# Patient Record
Sex: Female | Born: 1935 | Race: White | Hispanic: No | Smoking: Former smoker
Health system: Southern US, Community
[De-identification: ages and names within clinical notes are randomized; demographics above are authoritative.]

## PROBLEM LIST (undated history)

## (undated) DIAGNOSIS — J45909 Unspecified asthma, uncomplicated: Secondary | ICD-10-CM

## (undated) DIAGNOSIS — K635 Polyp of colon: Secondary | ICD-10-CM

## (undated) DIAGNOSIS — R011 Cardiac murmur, unspecified: Secondary | ICD-10-CM

## (undated) DIAGNOSIS — M1990 Unspecified osteoarthritis, unspecified site: Secondary | ICD-10-CM

## (undated) DIAGNOSIS — E860 Dehydration: Secondary | ICD-10-CM

## (undated) DIAGNOSIS — I38 Endocarditis, valve unspecified: Secondary | ICD-10-CM

## (undated) DIAGNOSIS — J209 Acute bronchitis, unspecified: Secondary | ICD-10-CM

## (undated) DIAGNOSIS — J189 Pneumonia, unspecified organism: Secondary | ICD-10-CM

## (undated) DIAGNOSIS — F329 Major depressive disorder, single episode, unspecified: Secondary | ICD-10-CM

## (undated) DIAGNOSIS — I209 Angina pectoris, unspecified: Secondary | ICD-10-CM

## (undated) DIAGNOSIS — B019 Varicella without complication: Secondary | ICD-10-CM

## (undated) DIAGNOSIS — E039 Hypothyroidism, unspecified: Secondary | ICD-10-CM

## (undated) DIAGNOSIS — N189 Chronic kidney disease, unspecified: Secondary | ICD-10-CM

## (undated) DIAGNOSIS — F0390 Unspecified dementia without behavioral disturbance: Secondary | ICD-10-CM

## (undated) DIAGNOSIS — I10 Essential (primary) hypertension: Secondary | ICD-10-CM

## (undated) DIAGNOSIS — R0602 Shortness of breath: Secondary | ICD-10-CM

## (undated) DIAGNOSIS — N390 Urinary tract infection, site not specified: Secondary | ICD-10-CM

## (undated) DIAGNOSIS — D649 Anemia, unspecified: Secondary | ICD-10-CM

## (undated) DIAGNOSIS — F32A Depression, unspecified: Secondary | ICD-10-CM

## (undated) DIAGNOSIS — J069 Acute upper respiratory infection, unspecified: Secondary | ICD-10-CM

## (undated) DIAGNOSIS — M79605 Pain in left leg: Secondary | ICD-10-CM

## (undated) DIAGNOSIS — R51 Headache: Secondary | ICD-10-CM

## (undated) DIAGNOSIS — F419 Anxiety disorder, unspecified: Secondary | ICD-10-CM

## (undated) DIAGNOSIS — I499 Cardiac arrhythmia, unspecified: Secondary | ICD-10-CM

## (undated) HISTORY — PX: BACK SURGERY: SHX140

## (undated) HISTORY — PX: ABDOMINAL HYSTERECTOMY: SHX81

## (undated) HISTORY — PX: HAND SURGERY: SHX662

## (undated) HISTORY — PX: COLONOSCOPY W/ POLYPECTOMY: SHX1380

## (undated) HISTORY — DX: Dehydration: E86.0

## (undated) HISTORY — DX: Varicella without complication: B01.9

## (undated) HISTORY — DX: Pain in left leg: M79.605

## (undated) HISTORY — DX: Acute bronchitis, unspecified: J20.9

## (undated) HISTORY — DX: Endocarditis, valve unspecified: I38

## (undated) HISTORY — PX: APPENDECTOMY: SHX54

## (undated) HISTORY — DX: Urinary tract infection, site not specified: N39.0

## (undated) HISTORY — PX: CHOLECYSTECTOMY: SHX55

## (undated) HISTORY — PX: TONSILLECTOMY: SHX5217

## (undated) HISTORY — PX: FRACTURE SURGERY: SHX138

## (undated) HISTORY — DX: Chronic kidney disease, unspecified: N18.9

## (undated) HISTORY — PX: SHOULDER ARTHROSCOPY: SHX128

## (undated) HISTORY — PX: OTHER SURGICAL HISTORY: SHX169

## (undated) HISTORY — PX: EYE SURGERY: SHX253

## (undated) HISTORY — PX: TUBAL LIGATION: SHX77

## (undated) HISTORY — DX: Polyp of colon: K63.5

## (undated) HISTORY — DX: Unspecified osteoarthritis, unspecified site: M19.90

## (undated) HISTORY — PX: TONSILLECTOMY: SUR1361

---

## 1999-08-01 ENCOUNTER — Ambulatory Visit (HOSPITAL_COMMUNITY): Admission: RE | Admit: 1999-08-01 | Discharge: 1999-08-01 | Payer: Self-pay | Admitting: Internal Medicine

## 1999-08-01 ENCOUNTER — Encounter: Payer: Self-pay | Admitting: Internal Medicine

## 2004-04-03 ENCOUNTER — Observation Stay (HOSPITAL_COMMUNITY): Admission: RE | Admit: 2004-04-03 | Discharge: 2004-04-04 | Payer: Self-pay | Admitting: Obstetrics and Gynecology

## 2004-04-03 ENCOUNTER — Encounter (INDEPENDENT_AMBULATORY_CARE_PROVIDER_SITE_OTHER): Payer: Self-pay | Admitting: *Deleted

## 2004-06-25 ENCOUNTER — Inpatient Hospital Stay (HOSPITAL_COMMUNITY): Admission: RE | Admit: 2004-06-25 | Discharge: 2004-06-28 | Payer: Self-pay | Admitting: *Deleted

## 2004-06-25 ENCOUNTER — Encounter (INDEPENDENT_AMBULATORY_CARE_PROVIDER_SITE_OTHER): Payer: Self-pay | Admitting: *Deleted

## 2004-11-22 ENCOUNTER — Emergency Department (HOSPITAL_COMMUNITY): Admission: EM | Admit: 2004-11-22 | Discharge: 2004-11-22 | Payer: Self-pay | Admitting: Emergency Medicine
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
## 2006-12-15 ENCOUNTER — Emergency Department (HOSPITAL_COMMUNITY): Admission: EM | Admit: 2006-12-15 | Discharge: 2006-12-16 | Payer: Self-pay | Admitting: Emergency Medicine

## 2007-01-25 ENCOUNTER — Ambulatory Visit (HOSPITAL_COMMUNITY): Admission: RE | Admit: 2007-01-25 | Discharge: 2007-01-26 | Payer: Self-pay | Admitting: Orthopaedic Surgery

## 2007-08-09 ENCOUNTER — Ambulatory Visit (HOSPITAL_COMMUNITY): Admission: RE | Admit: 2007-08-09 | Discharge: 2007-08-10 | Payer: Self-pay | Admitting: Orthopaedic Surgery

## 2007-11-02 ENCOUNTER — Emergency Department (HOSPITAL_COMMUNITY): Admission: EM | Admit: 2007-11-02 | Discharge: 2007-11-02 | Payer: Self-pay | Admitting: Emergency Medicine
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
## 2008-04-10 IMAGING — CR DG CHEST 2V
2 series · 2 of 2 positions shown · non-contrast
Comparison: none

CLINICAL DATA: Ulnar shaft fracture

CHEST - 2 VIEW:

[view not recorded (1 of 2)]
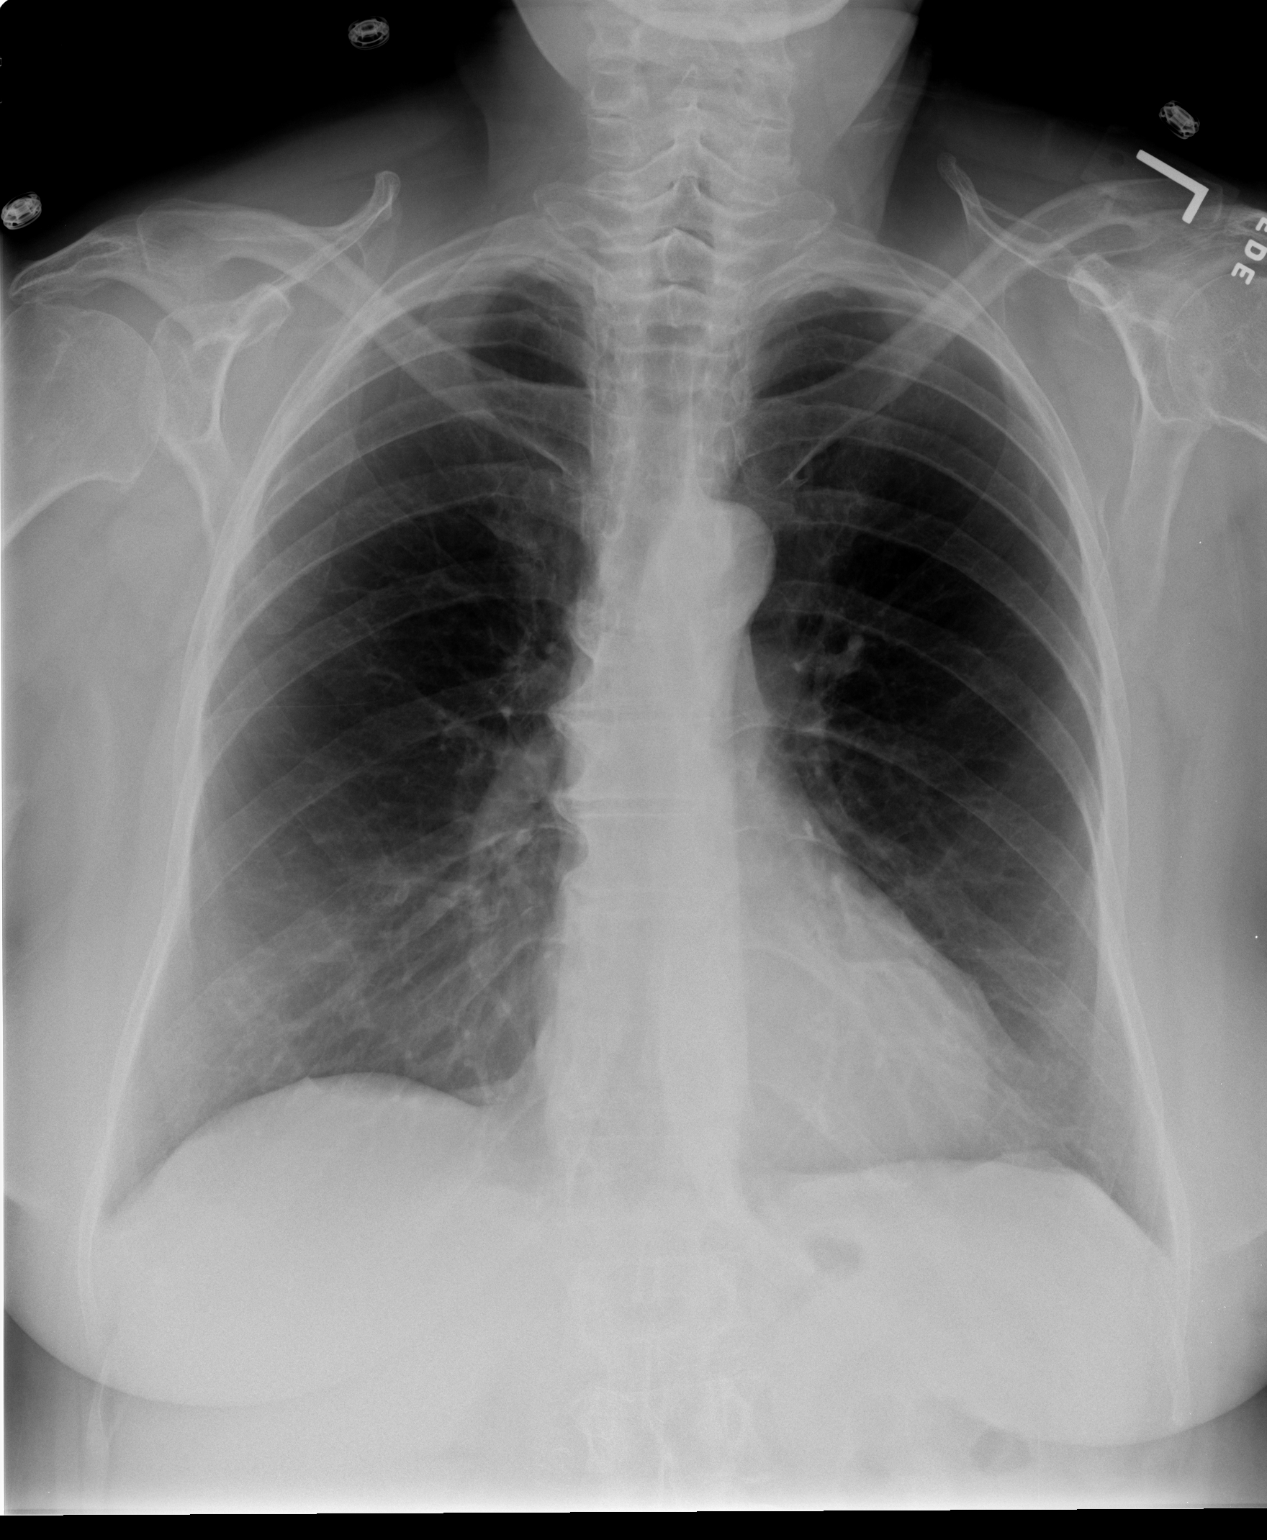

[view not recorded (2 of 2)]
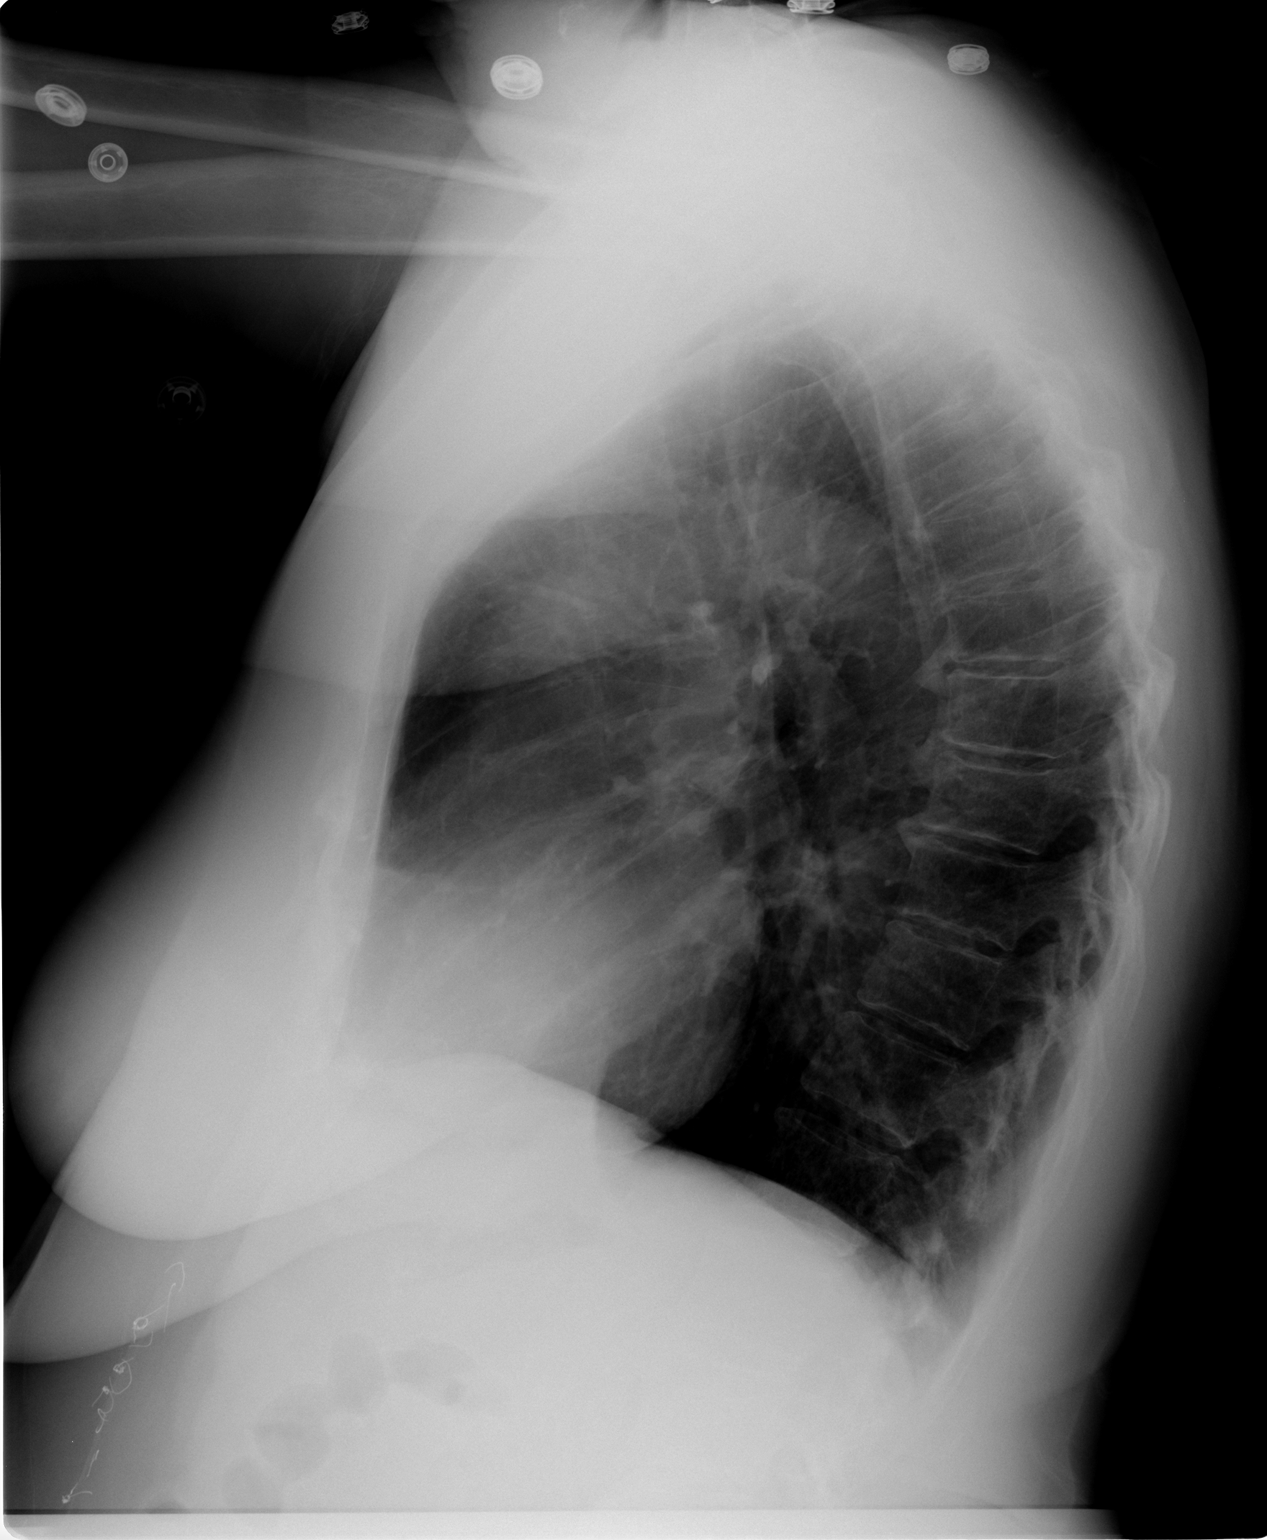

[2 of 2 positions shown; findings below may reference images not displayed]

FINDINGS: The heart size and mediastinal contours are within normal limits.

Both lungs are clear.  

Mild thoracic spondylosis.
IMPRESSION: No active cardiopulmonary disease.
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
## 2009-07-16 ENCOUNTER — Emergency Department (HOSPITAL_COMMUNITY): Admission: EM | Admit: 2009-07-16 | Discharge: 2009-07-16 | Payer: Self-pay | Admitting: Emergency Medicine

## 2009-07-21 ENCOUNTER — Emergency Department (HOSPITAL_COMMUNITY): Admission: EM | Admit: 2009-07-21 | Discharge: 2009-07-21 | Payer: Self-pay | Admitting: Emergency Medicine

## 2009-08-03 ENCOUNTER — Ambulatory Visit (HOSPITAL_BASED_OUTPATIENT_CLINIC_OR_DEPARTMENT_OTHER): Admission: RE | Admit: 2009-08-03 | Discharge: 2009-08-03 | Payer: Self-pay | Admitting: Urology

## 2009-10-23 ENCOUNTER — Encounter: Admission: RE | Admit: 2009-10-23 | Discharge: 2009-10-23 | Payer: Self-pay | Admitting: Gastroenterology

## 2010-04-29 ENCOUNTER — Emergency Department (HOSPITAL_COMMUNITY)
Admission: EM | Admit: 2010-04-29 | Discharge: 2010-04-29 | Payer: Self-pay | Source: Home / Self Care | Admitting: Emergency Medicine

## 2010-05-29 ENCOUNTER — Encounter
Admission: RE | Admit: 2010-05-29 | Discharge: 2010-05-29 | Payer: Self-pay | Source: Home / Self Care | Attending: Orthopedic Surgery | Admitting: Orthopedic Surgery

## 2010-06-19 ENCOUNTER — Inpatient Hospital Stay (HOSPITAL_COMMUNITY)
Admission: RE | Admit: 2010-06-19 | Discharge: 2010-06-24 | Payer: Self-pay | Source: Home / Self Care | Attending: Orthopedic Surgery | Admitting: Orthopedic Surgery

## 2010-06-22 NOTE — H&P (Addendum)
[name], [name]                 ACCOUNT NO.:  [id number]  MEDICAL RECORD NO.:  [id number]          PATIENT TYPE:  INP  LOCATION:  1612                         FACILITY:  [facility]  PHYSICIAN:  Georges Lynch. Lavelle Berland, M.D.DATE OF BIRTH:  03-29-1921  DATE OF ADMISSION:  06/19/2010 DATE OF DISCHARGE:                             HISTORY & PHYSICAL   CHIEF COMPLAINT:  Low back pain.  BRIEF HISTORY:  [name] came in with her daughter-in-law to go over myelogram.  She has been having severe pain in the left lower back that is radiating down the left lower leg causing weakness in her left foot. Myelogram revealed that she has severe spinal stenosis at L4-L5.  She also has postsurgical changes at L3-L4 and she has calcified disk along with the stenosis at this level.  She now presents for decompressive lumbar laminectomy at L3-L4, L4-L5 with microdiskectomy at L4-L5 on the left.  MEDICATION ALLERGIES:  CODEINE.  The patient does fine with Vicodin.  PRIMARY CARE PHYSICIAN:  Ace Gins, MD  CARDIOLOGIST:  Landry Corporal, MD  CURRENT MEDICATIONS: 1. Hydrochlorothiazide. 2. Lisinopril. 3. Levothyroxine. 4. Donepezil. 5. Hydrocodone/acetaminophen.  PAST MEDICAL HISTORY: 1. Low back pain with radiculopathy down the left leg. 2. Impaired memory. 3. Impaired hearing. 4. Dentures on top. 5. History of pneumonia. 6. Hypertension. 7. History of urinary tract infection, last one being at least a year     ago. 8. Thyroid disease. 9. Arthritis. 10.Degenerative disk disease. 11.History of measles, mumps, and rubella as a child. 12.History of menopause.  PAST SURGICAL HISTORY: 1. History of lumbar microdiskectomy x2. 2. History of lithotripsy for multiple kidney stones. 3. Bilateral carpal tunnel release. 4. Appendectomy. 5. Cholecystectomy. 6. Partial thyroidectomy. 7. Bilateral wrist fractures. 8. Cataract surgery. 9. Tonsillectomy. 10.Hysterectomy. 11.Bilateral foot  surgery. 12.Bilateral shoulder surgery.  FAMILY HISTORY:  Father had heart disease.  He passed of a stroke. Mother had osteoarthritis, passed of old age.  SOCIAL HISTORY:  She denies use of alcohol or tobacco products.  She is retired.  She lives alone and she would like to look into a skilled nursing facility for care after her hospital stay.  REVIEW OF SYSTEMS:  GENERAL:  Negative for fevers, chills, or weight change.  HEENT:  Noncontributory.  CARDIOVASCULAR:  History of hypertension.  RESPIRATORY:  Negative for shortness of breath.  GI: Negative for reflux disease or ulcers.  GU:  History of kidney stone and remote history of urinary tract infection.  SKIN:  Negative for rash or lesion.  ENDOCRINE:  Positive for thyroid disease.  NEUROLOGIC: Positive for weakness and paresthesias, see HPI.  HEMATOPOIETIC: Negative for bleeding disorder.  PHYSICAL EXAMINATION:  CONSTITUTIONAL/PSYCHIATRIC:  [name] is alert and oriented x3.  She is well developed, well nourished.  She is accompanied today by her daughter-in-law. HEENT:  Normocephalic, atraumatic.  Extraocular movements intact.  [name] is wearing glasses which she does at all times.  She also has dentures on top. NECK:  Supple, full range of motion without lymphadenopathy. CHEST:  Lungs are clear to auscultation bilaterally without wheezes. HEART:  Regular rate and rhythm without  murmur.  S1, S2 sounds are appreciated. ABDOMEN:  Bowel sounds present in all four quadrants.  Abdomen is soft, nontender, nondistended to palpation. MUSCULOSKELETAL:  Evaluation of the low back, no pain with palpation of lumbar spine.  She does have pain with hyperextension and left lateral flexion of the lumbar spine. NEUROLOGIC:  Decreased sensation in the left lower extremity.  She also has weakness on the dorsiflexor of the left foot and she has positive left straight leg raise. SKIN:  Unremarkable. LYMPHATIC:  Negative for peripheral  edema.  Again, myelogram revealed spinal stenosis at L3-L4, L4-L5 with disk herniation at L4-L5 on the left.  IMPRESSION:  Spinal stenosis with disk herniation at L4-L5 on the left.  PLAN:  [name] is scheduled for decompressive lumbar laminectomy to be performed by Dr. Darrelyn Hillock.  The patient will undergo all preoperative labs and testing at [facility].     Rozell Searing, [facility]   ______________________________ Georges Lynch. Darrelyn Hillock, M.D.    LD/MEDQ  D:  06/21/2010  T:  06/21/2010  Job:  [id number]  Electronically Signed by Rozell Searing  on 06/22/2010 07:32:54 AM Electronically Signed by Ranee Gosselin M.D. on 06/24/2010 11:51:44 AM

## 2010-06-24 LAB — TYPE AND SCREEN
Antibody Screen: POSITIVE
DAT, IgG: NEGATIVE
PT AG Type: NEGATIVE

## 2010-06-24 LAB — ABO/RH: ABO/RH(D): O NEG

## 2010-06-24 LAB — COMPREHENSIVE METABOLIC PANEL
ALT: 19 U/L (ref 0–35)
Alkaline Phosphatase: 80 U/L (ref 39–117)
BUN: 17 mg/dL (ref 6–23)
Creatinine, Ser: 1 mg/dL (ref 0.4–1.2)
Total Protein: 6.9 g/dL (ref 6.0–8.3)

## 2010-06-24 LAB — HEMOGLOBIN
Hemoglobin: 10.8 g/dL — ABNORMAL LOW (ref 12.0–15.0)
Hemoglobin: 9.7 g/dL — ABNORMAL LOW (ref 12.0–15.0)

## 2010-06-24 LAB — URINE MICROSCOPIC-ADD ON

## 2010-06-24 LAB — CBC
HCT: 36.8 % (ref 36.0–46.0)
MCHC: 34.2 g/dL (ref 30.0–36.0)
Platelets: 286 10*3/uL (ref 150–400)
RBC: 4.31 MIL/uL (ref 3.87–5.11)
RDW: 12.6 % (ref 11.5–15.5)

## 2010-06-24 LAB — PROTIME-INR: Prothrombin Time: 13.5 seconds (ref 11.6–15.2)

## 2010-06-24 LAB — URINALYSIS, ROUTINE W REFLEX MICROSCOPIC
Nitrite: NEGATIVE
Specific Gravity, Urine: 1.019 (ref 1.005–1.030)
Urine Glucose, Fasting: NEGATIVE mg/dL
pH: 5.5 (ref 5.0–8.0)

## 2010-06-24 LAB — SURGICAL PCR SCREEN: Staphylococcus aureus: INVALID — AB

## 2010-06-24 LAB — APTT: aPTT: 32 seconds (ref 24–37)

## 2010-06-24 LAB — DIFFERENTIAL
Eosinophils Absolute: 0.1 10*3/uL (ref 0.0–0.7)
Eosinophils Relative: 1 % (ref 0–5)
Monocytes Absolute: 0.4 10*3/uL (ref 0.1–1.0)
Neutro Abs: 4.8 10*3/uL (ref 1.7–7.7)

## 2010-06-24 LAB — HEMATOCRIT
HCT: 31.6 % — ABNORMAL LOW (ref 36.0–46.0)
HCT: 27.9 % — ABNORMAL LOW (ref 36.0–46.0)

## 2010-06-24 NOTE — Op Note (Signed)
  NAMEDAISEE, CENTNER                 ACCOUNT NO.:  [id number]  MEDICAL RECORD NO.:  [id number]          PATIENT TYPE:  INP  LOCATION:  0012                         FACILITY:  [facility]  PHYSICIAN:  Georges Lynch. Yan Okray, M.D.DATE OF BIRTH:  25-Mar-1921  DATE OF PROCEDURE:  06/19/2010 DATE OF DISCHARGE:                              OPERATIVE REPORT   CORRECTION:  In the dictation, I said Dura gel.  It was DuraSeal liquid that we first injected as a sealant and then we applied Surgicel and then dura foam.          ______________________________ Georges Lynch. Darrelyn Hillock, M.D.     RAG/MEDQ  D:  06/19/2010  T:  06/19/2010  Job:  [id number]  Electronically Signed by Ranee Gosselin M.D. on 06/24/2010 11:51:41 AM

## 2010-06-24 NOTE — Op Note (Signed)
NAMECARLEAH, YABLONSKI                 ACCOUNT NO.:  [id number]  MEDICAL RECORD NO.:  [id number]          PATIENT TYPE:  INP  LOCATION:  0012                         FACILITY:  [facility]  PHYSICIAN:  Georges Lynch. Makynleigh Breslin, M.D.DATE OF BIRTH:  [birth year]  DATE OF PROCEDURE:  06/19/2010 DATE OF DISCHARGE:                              OPERATIVE REPORT   An 77 year old.  PREOPERATIVE DIAGNOSES: 1. Severe spinal stenosis at L3-L4. 2. Severe spinal stenosis at L4-L5 and questionable disk herniation L4-     L5 versus facet arthritis. 3. She had a partial foot drop on the left. 4. She had 2 previous highlighted surgeries by another surgeon years     ago for spinal stenosis and I had no further details on those     operations because she does not know who did the surgery and where     it was done.  POSTOPERATIVE DIAGNOSES: 1. Severe spinal stenosis at L3-L4. 2. Severe spinal stenosis at L4-L5 with severe compromise of the     foramina on the left.  SURGEON:  Georges Lynch. Darrelyn Hillock, M.D.  ASSISTANT:  Marlowe Kays, M.D.  DESCRIPTION OF PROCEDURE:  Under general anesthesia, routine orthopedic prep and draping of the lower back carried out with the patient on spinal frame.  The appropriate time-out was carried out.  I did mark the back on the left side, so we knew exactly where the foot drop side was and where all her pain was.  After sterile prep and draping, two needles were placed in back for localization purpose.  Multiple x-rays were taken.  An incision was made over the L3-L4, L4-L5 interspaces and carried out distally.  At this time, we cauterized all the small bleeders that we had.  We stripped the muscle from the lamina and spinous processes bilaterally.  Two clamps were placed on the back for localization purposes again.  Following that, we then removed the spinous processes of L3 and L4 and part of L5.  We carried the dissection down entirely to the midline.  The microscope was brought  in. Note, the midline of her spine highlighted this, was like that she had a concrete.  There was no obvious openings, starting out.  It was not the typical interlaminar space.  She had 2 previous surgeries elsewhere. She does not recall the details of the surgery, but she presented with severe pain as I mentioned in the left leg and with a partial foot drop on the left.  So, we proceeded very carefully.  We went down to the normal.  We felt that normal area of bone was then worked our way up proximally and then we were able to enter the subligamentous space. Thus, the microscope was used.  We took a careful painstaking time to go through and identify the dura and all the scar tissue that was present. We found a large clump of steroid material that was injected in the past.  Once we proceeded, we were now able to expose the dura.  There was marked amount of scar tissue.  The dura was adhesed to the bone. We carefully went  up and down the canal at L3-L4, L4-L5 all way down distally and we opened up the foramina bilaterally.  At this time, we had a very small dural leak at the L4-L5 level.  When we attempted to remove the remaining piece of bone, it was literally adhered to the dura.  We felt at this time that it was necessary to remove that bone because of the compression and fortunately we had a very small leak. Following that, after we had irrigated the area out and after we had the dura free, we then utilized the appropriate procedure.  We utilized DuraGel liquid.  Following that, we used Surgicel and appropriate Duragen patch.  Other way before we close, we had excellent control of the spinal fluid leak.  There was no further leakage of fluid.  We stayed and observed the area closely.  We gave her another gram of IV Ancef.  We utilized a Durafoam patch and some Surgicel as well as DuraGel liquid.  We made sure that we had the canal opened, so there was no compression on the canal.  We  then closed the wound in layered usual fashion except I left a small distal deep and proximal deep portions of the wound opened for drainage purposes, so there was no hematoma formation on the dura.  Subcutaneous was closed with 0 Vicryl and skin with metal staples.  The patient left the operating room in satisfactory condition.  Note, I stayed in the recovery room with the patient, I liked this, and she was able to move both legs.  She had normal sensation in both legs and she no longer had a foot drop on the left that she had preop.  We planned that she is 89 to put her on the step- down bed, watch her closely, and keep her down at bedrest for at least 48 hours and we will utilize PAS hose.          ______________________________ Georges Lynch. Darrelyn Hillock, M.D.     RAG/MEDQ  D:  06/19/2010  T:  06/19/2010  Job:  [id number]  Electronically Signed by Ranee Gosselin M.D. on 06/24/2010 11:51:39 AM

## 2010-06-25 LAB — MRSA CULTURE

## 2010-07-15 NOTE — Discharge Summary (Addendum)
NAMECARLYON, [name]                 ACCOUNT NO.:  [id number]  MEDICAL RECORD NO.:  [id number]          PATIENT TYPE:  INP  LOCATION:  1612                         FACILITY:  [facility]  PHYSICIAN:  Georges Lynch. Gioffre, M.D.DATE OF BIRTH:  [birth year]  DATE OF ADMISSION:  06/19/2010 DATE OF DISCHARGE:  06/24/2010                              DISCHARGE SUMMARY   ADMITTING DIAGNOSES: 1. Low back pain with radiculopathy down the left leg. 2. Impaired memory. 3. Impaired hearing. 4. Dentures on top. 5. History of pneumonia. 6. Hypertension. 7. History of urinary tract infection. 8. Thyroid disease. 9. Arthritis. 10.Degenerative disk disease. 11.History of measles, mumps and rubella as a child. 12.History of menopause.  DISCHARGE DIAGNOSES:  Low back pain with radiculopathy down the left leg status post surgery for spinal stenosis at  L3-L4, L4-L5 and disk herniation at L4-L5.  LABORATORY DATA:  Preoperative CBC revealed a white count of 7.6, hemoglobin 12.6, hematocrit 36.8 and platelet count of 286. Preoperative INR is 1.01.  Preoperative chemistry panel is unremarkable. Preoperative urinalysis revealed slight urinary tract infection.  White blood cells 11 to 20, few bacteria and small leukocyte esterase. Preoperative PCR for MRSA is negative and PCR for staph aureus is positive.  PROCEDURE:  Surgery for spinal stenoses at L3-L4, L4-L5 and microdiskectomy at L4-L5.  HOSPITAL COURSE:  On June 19, 2010, Ms. Gatton was admitted to [facility].  She was taken to the operating room by surgeon Dr. Ranee Gosselin, assistant Dr. Marlowe Kays.  She underwent the above- stated procedure.  It was performed under general anesthesia.  Routine orthopedic prep and drape was carried out.  No complications during the procedure.  She was returned to the recovery room in satisfactory condition.  After adequate time in the recovery room, she was then taken to the floor for further  recovery.  She was on PCA reduced dose, Dilaudid and muscle relaxants for pain control.  Postoperative day #1, the patient seemed to be improving.  She had no leg pain.  Continued to do well. Postoperative day #2, she was resting comfortably, continuing to use the PCA.  Dressing was changed.  Wound  was well approximated.  No signs of infection.  Postoperative day #3, pain was well controlled.  She was visited by Physical Therapy.  On  June 23, 2010, she was able to ambulate 15 feet twice per the morning session and then in the afternoon session she was able to ambulate 30 feet, this was both using the rolling walker with minimal assistance in the afternoon session.  She was also able to work on stairs.  On postoperative day #5, the patient continued to progress and she was discharged home on postop day #5.  DISPOSITION:  To home on June 24, 2010.  DISCHARGE MEDICATIONS: 1. Donepezil. 2. Levothyroxine. 3. Lisinopril. 4. Hydrochlorothiazide. 5. Vicodin 10/650 mg. 6. Robaxin 500 mg on a  ACTIVITY:  The patient should increase her activity slowly.  She should use the walker.  She is able to shower.  She should be mindful to not submerge the wound in water.  DIET:  No restrictions.  WOUND CARE:  Daily dressing change.  FOLLOWUP:  Follow up with Dr. Darrelyn Hillock 1 week from Tuesday.  She should contact the office at [phone] to schedule the appointment.  CONDITION ON DISCHARGE:  Improving.     Rozell Searing, PAC   ______________________________ Georges Lynch Darrelyn Hillock, M.D.    LD/MEDQ  D:  07/09/2010  T:  07/09/2010  Job:  [id number]  Electronically Signed by Ranee Gosselin M.D. on 07/15/2010 09:03:01 AM Electronically Signed by Rozell Searing  on 07/15/2010 09:21:07 AM

## 2010-08-01 ENCOUNTER — Ambulatory Visit: Payer: Medicare Other | Admitting: Physical Therapy

## 2010-08-05 ENCOUNTER — Ambulatory Visit: Payer: Medicare Other | Attending: Orthopedic Surgery | Admitting: Rehabilitative and Restorative Service Providers"

## 2010-08-05 DIAGNOSIS — M545 Low back pain, unspecified: Secondary | ICD-10-CM | POA: Insufficient documentation

## 2010-08-05 DIAGNOSIS — M2569 Stiffness of other specified joint, not elsewhere classified: Secondary | ICD-10-CM | POA: Insufficient documentation

## 2010-08-05 DIAGNOSIS — IMO0001 Reserved for inherently not codable concepts without codable children: Secondary | ICD-10-CM | POA: Insufficient documentation

## 2010-08-05 DIAGNOSIS — M6281 Muscle weakness (generalized): Secondary | ICD-10-CM | POA: Insufficient documentation

## 2010-08-07 ENCOUNTER — Ambulatory Visit: Payer: Medicare Other | Admitting: Physical Therapy

## 2010-08-08 ENCOUNTER — Encounter: Payer: Medicare Other | Admitting: Rehabilitative and Restorative Service Providers"

## 2010-08-12 ENCOUNTER — Ambulatory Visit: Payer: Medicare Other | Admitting: Physical Therapy

## 2010-08-14 ENCOUNTER — Encounter: Payer: Medicare Other | Admitting: Physical Therapy

## 2010-08-19 ENCOUNTER — Encounter: Payer: Medicare Other | Admitting: Rehabilitative and Restorative Service Providers"

## 2010-08-21 ENCOUNTER — Encounter: Payer: Medicare Other | Admitting: Rehabilitative and Restorative Service Providers"

## 2010-08-21 LAB — URINE MICROSCOPIC-ADD ON

## 2010-08-21 LAB — POCT I-STAT, CHEM 8
BUN: 13 mg/dL (ref 6–23)
Calcium, Ion: 1.13 mmol/L (ref 1.12–1.32)
Chloride: 110 mEq/L (ref 96–112)
Glucose, Bld: 97 mg/dL (ref 70–99)
HCT: 38 % (ref 36.0–46.0)
Potassium: 3.8 mEq/L (ref 3.5–5.1)

## 2010-08-21 LAB — URINE CULTURE

## 2010-08-21 LAB — URINALYSIS, ROUTINE W REFLEX MICROSCOPIC
Bilirubin Urine: NEGATIVE
Glucose, UA: NEGATIVE mg/dL
Glucose, UA: NEGATIVE mg/dL
Ketones, ur: NEGATIVE mg/dL
Leukocytes, UA: NEGATIVE
Leukocytes, UA: NEGATIVE
Nitrite: NEGATIVE
Specific Gravity, Urine: 1.012 (ref 1.005–1.030)
Specific Gravity, Urine: 1.012 (ref 1.005–1.030)
pH: 6.5 (ref 5.0–8.0)
pH: 7 (ref 5.0–8.0)

## 2010-09-16 ENCOUNTER — Emergency Department (HOSPITAL_COMMUNITY)
Admission: EM | Admit: 2010-09-16 | Discharge: 2010-09-16 | Disposition: A | Discharge status: Home / Self Care | Payer: Medicare Other | Attending: Emergency Medicine | Admitting: Emergency Medicine

## 2010-09-16 ENCOUNTER — Emergency Department (HOSPITAL_COMMUNITY): Payer: Medicare Other

## 2010-09-16 DIAGNOSIS — E039 Hypothyroidism, unspecified: Secondary | ICD-10-CM | POA: Insufficient documentation

## 2010-09-16 DIAGNOSIS — I10 Essential (primary) hypertension: Secondary | ICD-10-CM | POA: Insufficient documentation

## 2010-09-16 DIAGNOSIS — R079 Chest pain, unspecified: Secondary | ICD-10-CM | POA: Insufficient documentation

## 2010-09-16 DIAGNOSIS — J40 Bronchitis, not specified as acute or chronic: Secondary | ICD-10-CM | POA: Insufficient documentation

## 2010-09-16 DIAGNOSIS — R05 Cough: Secondary | ICD-10-CM | POA: Insufficient documentation

## 2010-09-16 DIAGNOSIS — R059 Cough, unspecified: Secondary | ICD-10-CM | POA: Insufficient documentation

## 2010-09-16 DIAGNOSIS — R0602 Shortness of breath: Secondary | ICD-10-CM | POA: Insufficient documentation

## 2010-09-16 DIAGNOSIS — Z8612 Personal history of poliomyelitis: Secondary | ICD-10-CM | POA: Insufficient documentation

## 2010-09-19 ENCOUNTER — Ambulatory Visit (INDEPENDENT_AMBULATORY_CARE_PROVIDER_SITE_OTHER): Payer: Medicare Other | Admitting: Internal Medicine

## 2010-09-19 ENCOUNTER — Encounter: Payer: Self-pay | Admitting: Internal Medicine

## 2010-09-19 VITALS — BP 150/90 | Temp 98.0°F | Ht 65.5 in | Wt 162.0 lb

## 2010-09-19 DIAGNOSIS — G8929 Other chronic pain: Secondary | ICD-10-CM

## 2010-09-19 DIAGNOSIS — I10 Essential (primary) hypertension: Secondary | ICD-10-CM

## 2010-09-19 DIAGNOSIS — M549 Dorsalgia, unspecified: Secondary | ICD-10-CM

## 2010-09-19 DIAGNOSIS — J40 Bronchitis, not specified as acute or chronic: Secondary | ICD-10-CM

## 2010-09-19 DIAGNOSIS — E039 Hypothyroidism, unspecified: Secondary | ICD-10-CM

## 2010-09-19 DIAGNOSIS — D649 Anemia, unspecified: Secondary | ICD-10-CM

## 2010-09-19 LAB — CBC WITH DIFFERENTIAL/PLATELET
Basophils Relative: 0.2 % (ref 0.0–3.0)
Eosinophils Absolute: 0.1 10*3/uL (ref 0.0–0.7)
Eosinophils Relative: 0.8 % (ref 0.0–5.0)
HCT: 32.4 % — ABNORMAL LOW (ref 36.0–46.0)
Lymphs Abs: 2.2 10*3/uL (ref 0.7–4.0)
MCHC: 34 g/dL (ref 30.0–36.0)
MCV: 89.5 fl (ref 78.0–100.0)
Monocytes Absolute: 0.5 10*3/uL (ref 0.1–1.0)
Neutro Abs: 7.1 10*3/uL (ref 1.4–7.7)
RBC: 3.63 Mil/uL — ABNORMAL LOW (ref 3.87–5.11)
WBC: 9.8 10*3/uL (ref 4.5–10.5)

## 2010-09-19 MED ORDER — CHLORPHENIRAMINE-HYDROCODONE 8-10 MG/5ML PO LQCR: 2.5000 mL | Freq: Two times a day (BID) | ORAL | Status: DC | PRN

## 2010-09-22 ENCOUNTER — Encounter: Payer: Self-pay | Admitting: Internal Medicine

## 2010-09-22 DIAGNOSIS — E039 Hypothyroidism, unspecified: Secondary | ICD-10-CM | POA: Insufficient documentation

## 2010-09-22 DIAGNOSIS — I10 Essential (primary) hypertension: Secondary | ICD-10-CM | POA: Insufficient documentation

## 2010-09-22 DIAGNOSIS — M549 Dorsalgia, unspecified: Secondary | ICD-10-CM | POA: Insufficient documentation

## 2010-09-22 DIAGNOSIS — J40 Bronchitis, not specified as acute or chronic: Secondary | ICD-10-CM | POA: Insufficient documentation

## 2010-09-22 DIAGNOSIS — G8929 Other chronic pain: Secondary | ICD-10-CM | POA: Insufficient documentation

## 2010-09-22 DIAGNOSIS — D649 Anemia, unspecified: Secondary | ICD-10-CM | POA: Insufficient documentation

## 2010-09-22 NOTE — Assessment & Plan Note (Signed)
asymptomatic. No gross active bleeding. Repeat CBC

## 2010-09-22 NOTE — Assessment & Plan Note (Signed)
off medication. Recommend out patient blood pressure log to be submitted for review.

## 2010-09-22 NOTE — Progress Notes (Signed)
  Subjective:    Patient ID: [name], female    DOB: [birth year], 77 y.o.   MRN: [id number]  HPI Patient presents to clinic for evaluation of cough. Seen in the emergency department April 16 with nonproductive cough. Chest x-ray without infiltrate. Diagnosed with bronchitis and was placed on a 14 day course of doxycycline and Tessalon Perles p.r.n. Cough.Continues to have cough worse at night. Tessalon does not significant help the cough. Denies fevers chills sweats shortness of breath or wheezing.Compliant with doxycycline without adverse effect. Blood pressure elevated without headache dizziness chest pain. States is holding her blood pressure medication since being sick. In fact states never had hypertension and child was diagnosed with elevated blood pressure during illness Questions whether she is truly hypertensive. Reviewed hospital labs in January including hemoglobin of 9.7 previously 12.6 status post back surgery. Back surgery was successful with improvement of her left leg pain Able to ambulate however now has what she believes to be right hip arthritis pain.No alleviating or exacerbating factors. No other complaints.  Reviewed past medical history, past surgical history, medications, allergies, social history, and family history   Review of Systems  Constitutional: Negative for fever, chills and diaphoresis.  HENT: Positive for congestion and rhinorrhea. Negative for facial swelling.   Eyes: Negative for discharge and redness.  Respiratory: Positive for cough. Negative for shortness of breath and wheezing.   Cardiovascular: Negative for chest pain and palpitations.  Gastrointestinal: Negative for nausea, abdominal pain and blood in stool.  Genitourinary: Negative for decreased urine volume and difficulty urinating.  Musculoskeletal: Positive for arthralgias. Negative for back pain and joint swelling.  Skin: Negative for color change and rash.  Neurological: Negative for dizziness,  syncope, weakness and numbness.  Hematological: Negative for adenopathy. Does not bruise/bleed easily.  Psychiatric/Behavioral: Negative for confusion and agitation. The patient is not nervous/anxious.        Objective:   Physical Exam    Physical Exam  Vitals reviewed. Constitutional:  appears well-developed and well-nourished. No distress.  HENT:  Head: Normocephalic and atraumatic.  Right Ear: Tympanic membrane, external ear and ear canal normal.  Left Ear: Tympanic membrane, external ear and ear canal normal.  Nose: Nose normal.  Mouth/Throat: Oropharynx is clear and moist. No oropharyngeal exudate.  Eyes: Conjunctivae and EOM are normal. Pupils are equal, round, and reactive to light. Right eye exhibits no discharge. Left eye exhibits no discharge. No scleral icterus.  Neck: Neck supple. No thyromegaly present.  Cardiovascular: Normal rate, regular rhythm and normal heart sounds.  Exam reveals no gallop and no friction rub.   No murmur heard. Pulmonary/Chest: Effort normal and breath sounds normal. No respiratory distress.  has no wheezes.  has no rales.  Lymphadenopathy:   no cervical adenopathy.  Neurological:  is alert.  Skin: Skin is warm and dry.  not diaphoretic.  Psychiatric: normal mood and affect.      Assessment & Plan:

## 2010-09-22 NOTE — Assessment & Plan Note (Signed)
Improved status post back surgery.

## 2010-09-22 NOTE — Assessment & Plan Note (Signed)
Continue antibiotic as prescribed. Change Tessalon at night Tussionex half dose. Cautioned regarding possible sedating effect. Followup in one week or sooner if necessary

## 2010-09-24 ENCOUNTER — Telehealth: Payer: Self-pay

## 2010-09-24 NOTE — Telephone Encounter (Signed)
Message copied by Kyung Rudd on Tue Sep 24, 2010  4:10 PM ------      Message from: Letitia Libra, Maisie Fus      Created: Tue Sep 24, 2010  8:56 AM       Mildly anemic. Is improving from last check.

## 2010-09-24 NOTE — Telephone Encounter (Signed)
Pt aware.

## 2010-09-26 ENCOUNTER — Ambulatory Visit (INDEPENDENT_AMBULATORY_CARE_PROVIDER_SITE_OTHER): Payer: Medicare Other | Admitting: Internal Medicine

## 2010-09-26 ENCOUNTER — Encounter: Payer: Self-pay | Admitting: Internal Medicine

## 2010-09-26 DIAGNOSIS — K2990 Gastroduodenitis, unspecified, without bleeding: Secondary | ICD-10-CM

## 2010-09-26 DIAGNOSIS — I10 Essential (primary) hypertension: Secondary | ICD-10-CM

## 2010-09-26 DIAGNOSIS — J40 Bronchitis, not specified as acute or chronic: Secondary | ICD-10-CM

## 2010-09-26 DIAGNOSIS — D649 Anemia, unspecified: Secondary | ICD-10-CM

## 2010-09-26 DIAGNOSIS — E039 Hypothyroidism, unspecified: Secondary | ICD-10-CM

## 2010-09-26 DIAGNOSIS — K2970 Gastritis, unspecified, without bleeding: Secondary | ICD-10-CM | POA: Insufficient documentation

## 2010-09-26 DIAGNOSIS — N200 Calculus of kidney: Secondary | ICD-10-CM

## 2010-09-26 NOTE — Assessment & Plan Note (Signed)
Improving. Continue antibiotics to completion

## 2010-09-26 NOTE — Progress Notes (Signed)
  Subjective:    Patient ID: [name], female    DOB: [birth year], 77 y.o.   MRN: [id number]  HPI Pt presents to clinic for followup of cough. Is completing doxycycline without adverse effect. Compliant with medication. Cough and breathing improved. Tolerated Tussionex without sedation. 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 Has also had a past recurrent UTIs history of kidney stones but currently without symptoms. Now status post lumbar back surgery and doing well with exception of hip osteoarthritis. Currently taking Tylenol when necessary with some improvement. Outside record review indicates took Voltaren gel and Mobic when necessary in the past. EGD June 2011 demonstrated gastritis without PUD. Colonoscopy at that time was recommended to be repeated in three years which would be June 2014 review history of anemia and recent hemoglobin improved to eleven point zero from nine point seven postoperatively. No gross active bleeding. Last outside labs obtained the summer two thousand eleven with hemoglobin eleven point one normal Chem-7 TSH and free T4. No other complaints.  Reviewed past medical history, medications and allergies.     Review of Systems see history of present illness      Objective:   Physical Exam    Physical Exam  [nursing notereviewed. Constitutional:  appears well-developed and well-nourished. No distress.  HENT:  Head: Normocephalic and atraumatic.  Nose: Nose normal.  Mouth/Throat: Oropharynx is clear and moist. No oropharyngeal exudate.  Eyes: Conjunctivae are normal. No scleral icterus.  Neck: Neck supple.  Cardiovascular: Normal rate, regular rhythm and normal heart sounds.  Exam reveals no gallop and no friction rub.   No murmur heard. Pulmonary/Chest: Effort normal and breath sounds normal. No respiratory distress.  no wheezes.  no rales.  Lymphadenopathy:     no cervical adenopathy.    Neurological:  alert.  Skin: Skin is warm and dry.  not diaphoretic.     Assessment & Plan:

## 2010-09-26 NOTE — Assessment & Plan Note (Signed)
Stable. Average control. Continue current regimen. Obtain chem seven with next visit

## 2010-09-26 NOTE — Assessment & Plan Note (Signed)
Improved. Previously anemia likely related to postoperative course. Consider followup CBC at future visit

## 2010-09-26 NOTE — Assessment & Plan Note (Signed)
Stable. Asymptomatic. Obtain TSH and free T4 next visit

## 2010-10-03 IMAGING — CT CT ABD-PELV W/O CM
1 series · 15 of 32 positions shown, 19 images · non-contrast
Comparison: None

CLINICAL DATA: Left-sided flank pain.  History of kidney stones.

CT ABDOMEN AND PELVIS WITHOUT CONTRAST
TECHNIQUE: Multidetector CT imaging of the abdomen and pelvis was
performed following the standard protocol without intravenous
contrast.

[Series 4: lung windows · axial · 0.73mm/px · z∈[-443,-28]mm · 15 of 92 slices shown, 19 images]
[im 6/92  soft-tissue]
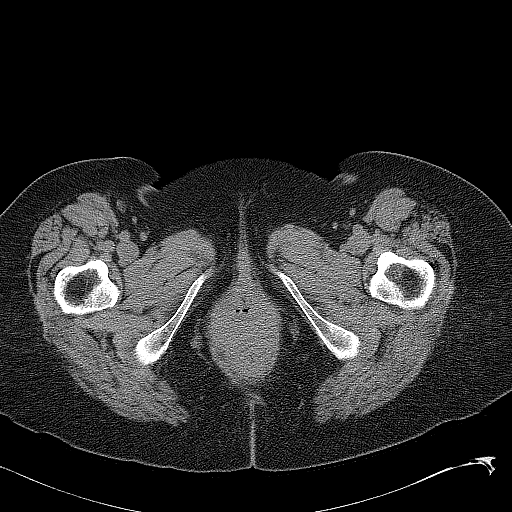
[im 6/92  bone]
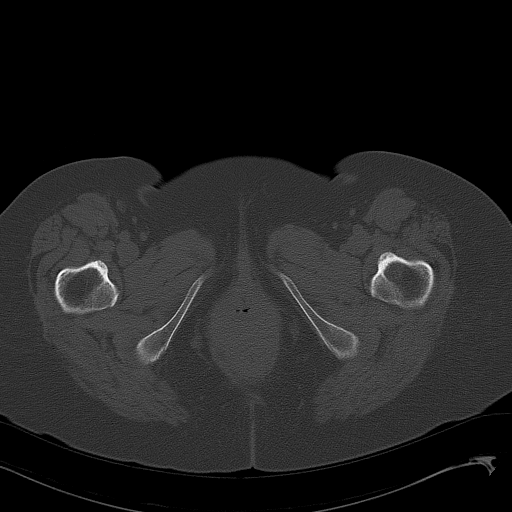
[im 12/92  soft-tissue]
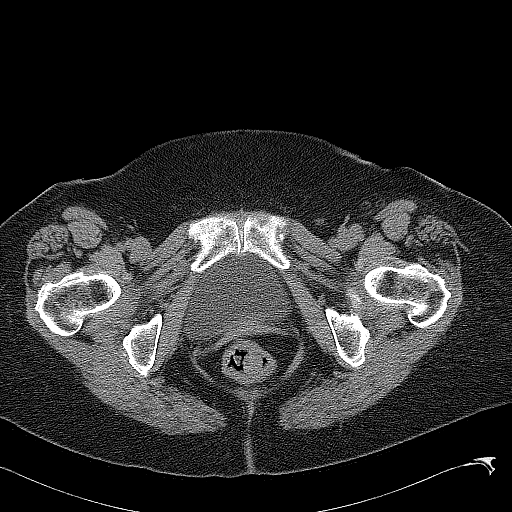
[im 18/92  soft-tissue]
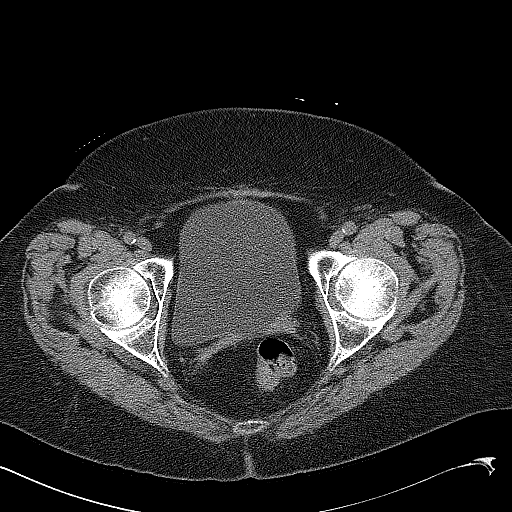
[im 27/92  soft-tissue]
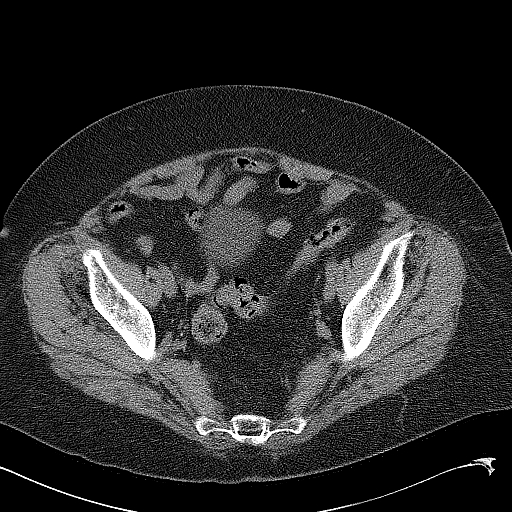
[im 33/92  soft-tissue]
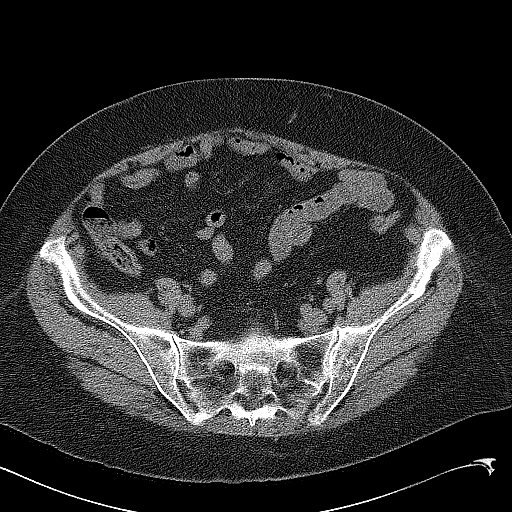
[im 39/92  soft-tissue]
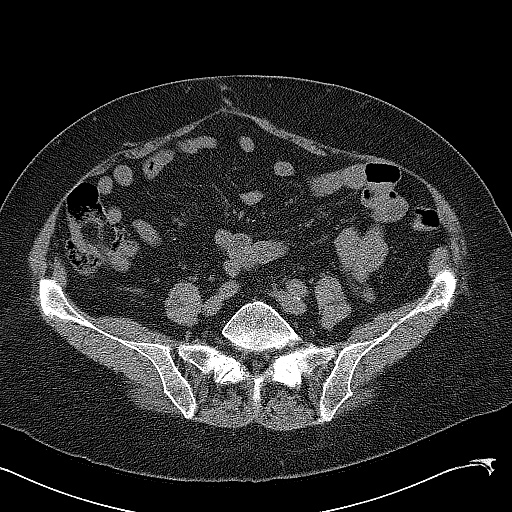
[im 47/92  soft-tissue]
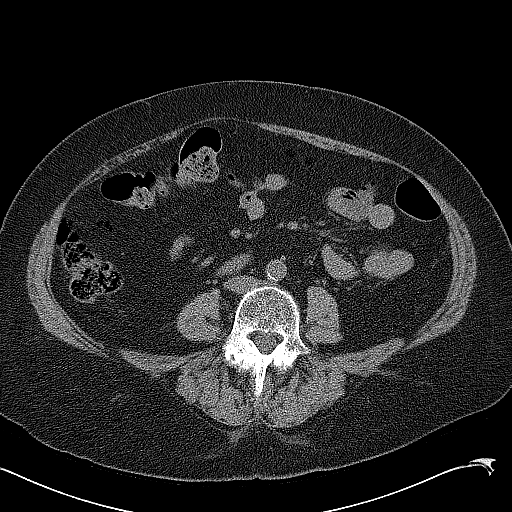
[im 53/92  soft-tissue]
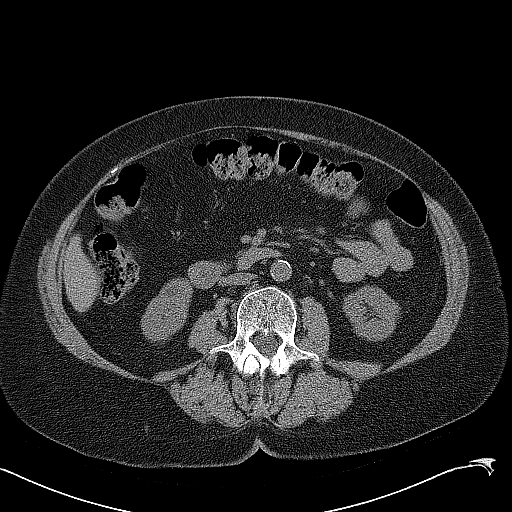
[im 59/92  soft-tissue]
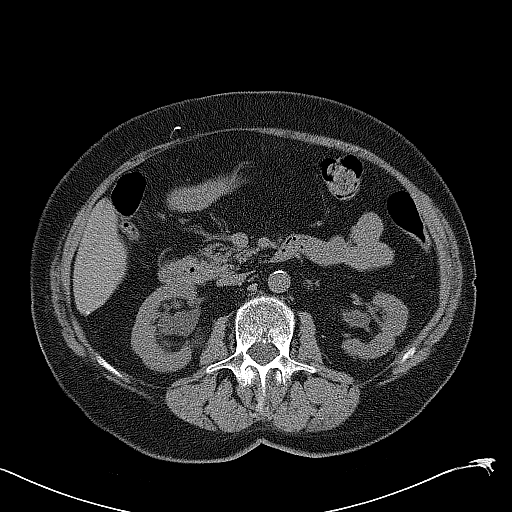
[im 59/92  bone]
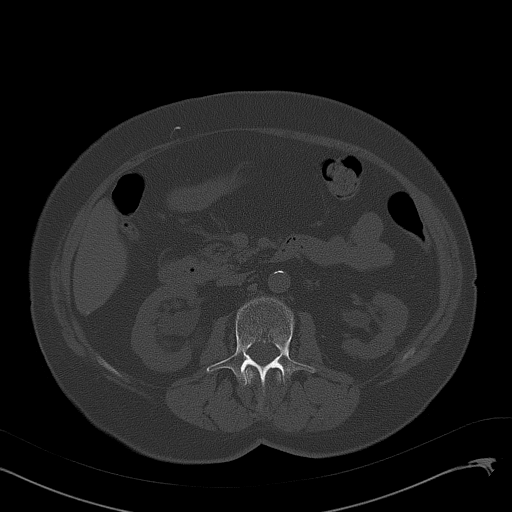
[im 65/92  soft-tissue]
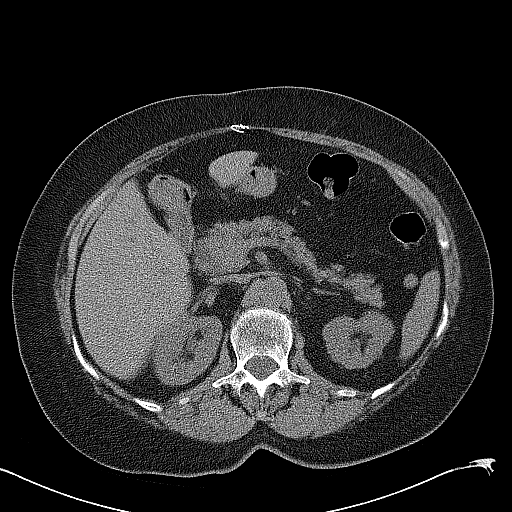
[im 74/92  soft-tissue]
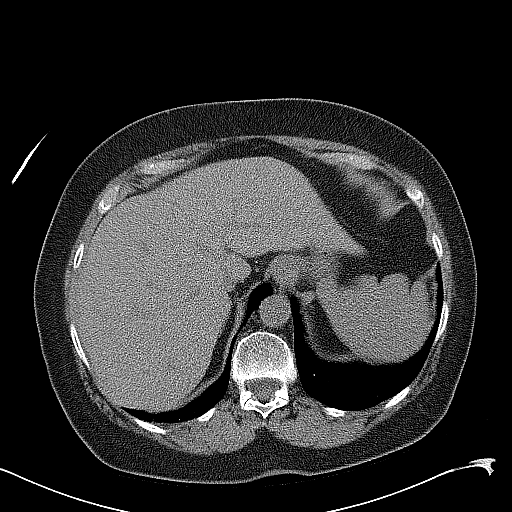
[im 80/92  soft-tissue]
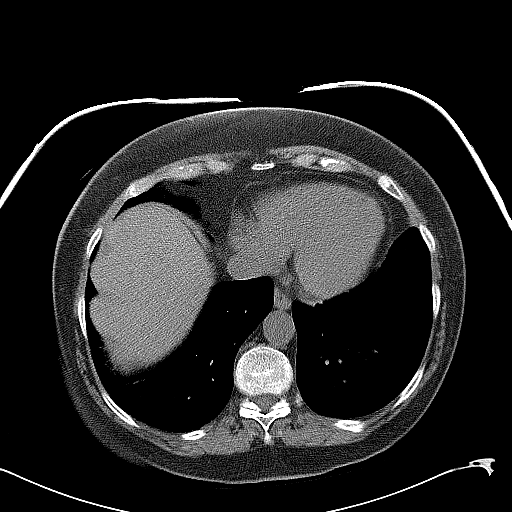
[im 80/92  lung]
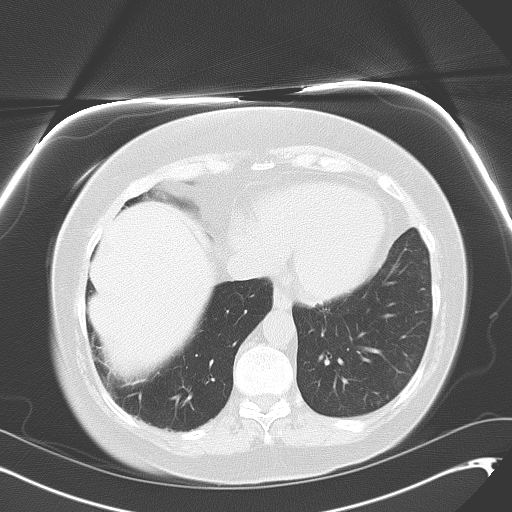
[im 83/92  lung]
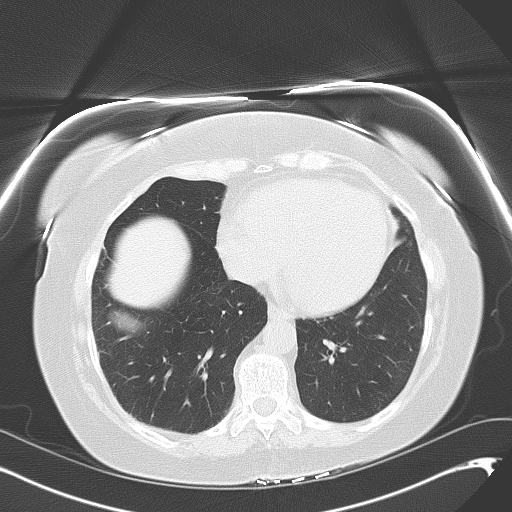
[im 86/92  soft-tissue]
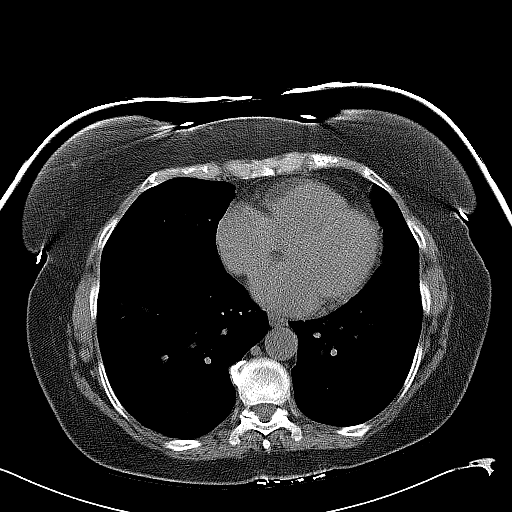
[im 86/92  lung]
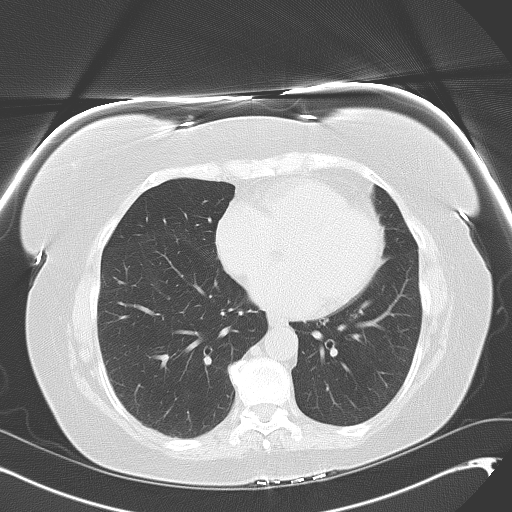
[im 89/92  lung]
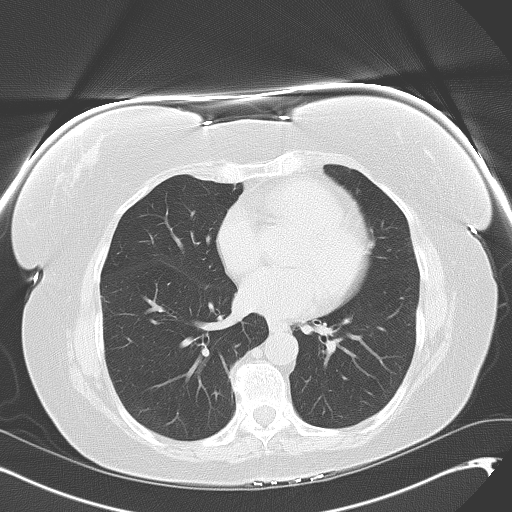

[15 of 32 positions shown; findings below may reference images not displayed]

FINDINGS: Lung bases are clear.  No pleural or pericardial fluid.
The liver appears unremarkable without contrast.  There are a few
tiny granulomas.  There is been previous cholecystectomy.  The
spleen is normal.  The pancreas is normal.  The adrenal glands are
normal.  The right kidney does not contain any stones.  There is
mild fullness of the renal collecting system but this is not appear
to be on the basis of ongoing obstruction.  There are multiple
calculi within the left kidney, most numerous in the lower pole.
No sign of hydroureteronephrosis.  There is a 1 mm stone at the
level of the acetabulum on the left.  This could be symptomatic.
There is atherosclerosis of the aorta but no aneurysm.  The IVC is
unremarkable.  No retroperitoneal mass or adenopathy.  No free
intraperitoneal fluid or air.  There is been previous hysterectomy.
No adnexal lesion.  No evidence of diverticulosis or
diverticulitis.
IMPRESSION: Multiple nonobstructing calculi in the left kidney.  Suspicion of a
1 mm stone in the left ureter at the acetabular level.  This does
not appear to be causing hydroureteronephrosis, but could certainly
be a cause of left-sided pain.

## 2010-10-05 ENCOUNTER — Emergency Department (HOSPITAL_COMMUNITY)
Admission: EM | Admit: 2010-10-05 | Discharge: 2010-10-05 | Disposition: A | Discharge status: Home / Self Care | Payer: Medicare Other | Attending: Emergency Medicine | Admitting: Emergency Medicine

## 2010-10-05 ENCOUNTER — Emergency Department (HOSPITAL_COMMUNITY): Payer: Medicare Other

## 2010-10-05 DIAGNOSIS — E039 Hypothyroidism, unspecified: Secondary | ICD-10-CM | POA: Insufficient documentation

## 2010-10-05 DIAGNOSIS — R109 Unspecified abdominal pain: Secondary | ICD-10-CM | POA: Insufficient documentation

## 2010-10-05 DIAGNOSIS — M545 Low back pain, unspecified: Secondary | ICD-10-CM | POA: Insufficient documentation

## 2010-10-05 DIAGNOSIS — N12 Tubulo-interstitial nephritis, not specified as acute or chronic: Secondary | ICD-10-CM | POA: Insufficient documentation

## 2010-10-05 DIAGNOSIS — R110 Nausea: Secondary | ICD-10-CM | POA: Insufficient documentation

## 2010-10-05 DIAGNOSIS — Z87442 Personal history of urinary calculi: Secondary | ICD-10-CM | POA: Insufficient documentation

## 2010-10-05 DIAGNOSIS — I10 Essential (primary) hypertension: Secondary | ICD-10-CM | POA: Insufficient documentation

## 2010-10-05 DIAGNOSIS — Z8612 Personal history of poliomyelitis: Secondary | ICD-10-CM | POA: Insufficient documentation

## 2010-10-05 LAB — URINALYSIS, ROUTINE W REFLEX MICROSCOPIC
Bilirubin Urine: NEGATIVE
Glucose, UA: NEGATIVE mg/dL
Ketones, ur: NEGATIVE mg/dL
Nitrite: NEGATIVE
Protein, ur: 30 mg/dL — AB
Specific Gravity, Urine: 1.018 (ref 1.005–1.030)
Urobilinogen, UA: 0.2 mg/dL (ref 0.0–1.0)
pH: 5.5 (ref 5.0–8.0)

## 2010-10-05 LAB — DIFFERENTIAL
Basophils Absolute: 0 K/uL (ref 0.0–0.1)
Basophils Relative: 0 % (ref 0–1)
Eosinophils Absolute: 0.1 10*3/uL (ref 0.0–0.7)
Eosinophils Relative: 1 % (ref 0–5)
Lymphocytes Relative: 25 % (ref 12–46)
Lymphs Abs: 2.2 K/uL (ref 0.7–4.0)
Monocytes Absolute: 0.5 10*3/uL (ref 0.1–1.0)
Monocytes Relative: 5 % (ref 3–12)
Neutro Abs: 6.2 K/uL (ref 1.7–7.7)
Neutrophils Relative %: 69 % (ref 43–77)

## 2010-10-05 LAB — CBC
HCT: 36.4 % (ref 36.0–46.0)
Hemoglobin: 12.2 g/dL (ref 12.0–15.0)
MCH: 28.8 pg (ref 26.0–34.0)
MCHC: 33.5 g/dL (ref 30.0–36.0)
MCV: 85.8 fL (ref 78.0–100.0)
Platelets: 294 K/uL (ref 150–400)
RBC: 4.24 MIL/uL (ref 3.87–5.11)
RDW: 13.1 % (ref 11.5–15.5)
WBC: 9 K/uL (ref 4.0–10.5)

## 2010-10-05 LAB — COMPREHENSIVE METABOLIC PANEL
ALT: 14 U/L (ref 0–35)
AST: 20 U/L (ref 0–37)
CO2: 24 mEq/L (ref 19–32)
Calcium: 9.8 mg/dL (ref 8.4–10.5)
GFR calc Af Amer: >60 mL/min (ref >60)
Potassium: 3.9 mEq/L (ref 3.5–5.1)
Sodium: 139 mEq/L (ref 135–145)
Total Protein: 6.7 g/dL (ref 6.0–8.3)

## 2010-10-05 LAB — URINE MICROSCOPIC-ADD ON

## 2010-10-05 LAB — LIPASE, BLOOD: Lipase: 27 U/L (ref 11–59)

## 2010-10-05 LAB — COMPREHENSIVE METABOLIC PANEL WITH GFR
Albumin: 3.6 g/dL (ref 3.5–5.2)
Alkaline Phosphatase: 83 U/L (ref 39–117)
BUN: 11 mg/dL (ref 6–23)
Chloride: 104 meq/L (ref 96–112)
Creatinine, Ser: 0.82 mg/dL (ref 0.4–1.2)
GFR calc non Af Amer: >60 mL/min (ref >60)
Glucose, Bld: 96 mg/dL (ref 70–99)
Total Bilirubin: 0.5 mg/dL (ref 0.3–1.2)

## 2010-10-06 LAB — URINE CULTURE
Colony Count: NO GROWTH
Culture  Setup Time: 201205051744
Culture: NO GROWTH

## 2010-10-08 IMAGING — CR DG ABDOMEN 1V
2 series · 2 of 2 positions shown · non-contrast
Comparison: CT 07/16/2009

CLINICAL DATA: Flank pain for 5 days

ABDOMEN - 1 VIEW

[t abdomen supine (1 of 2)]
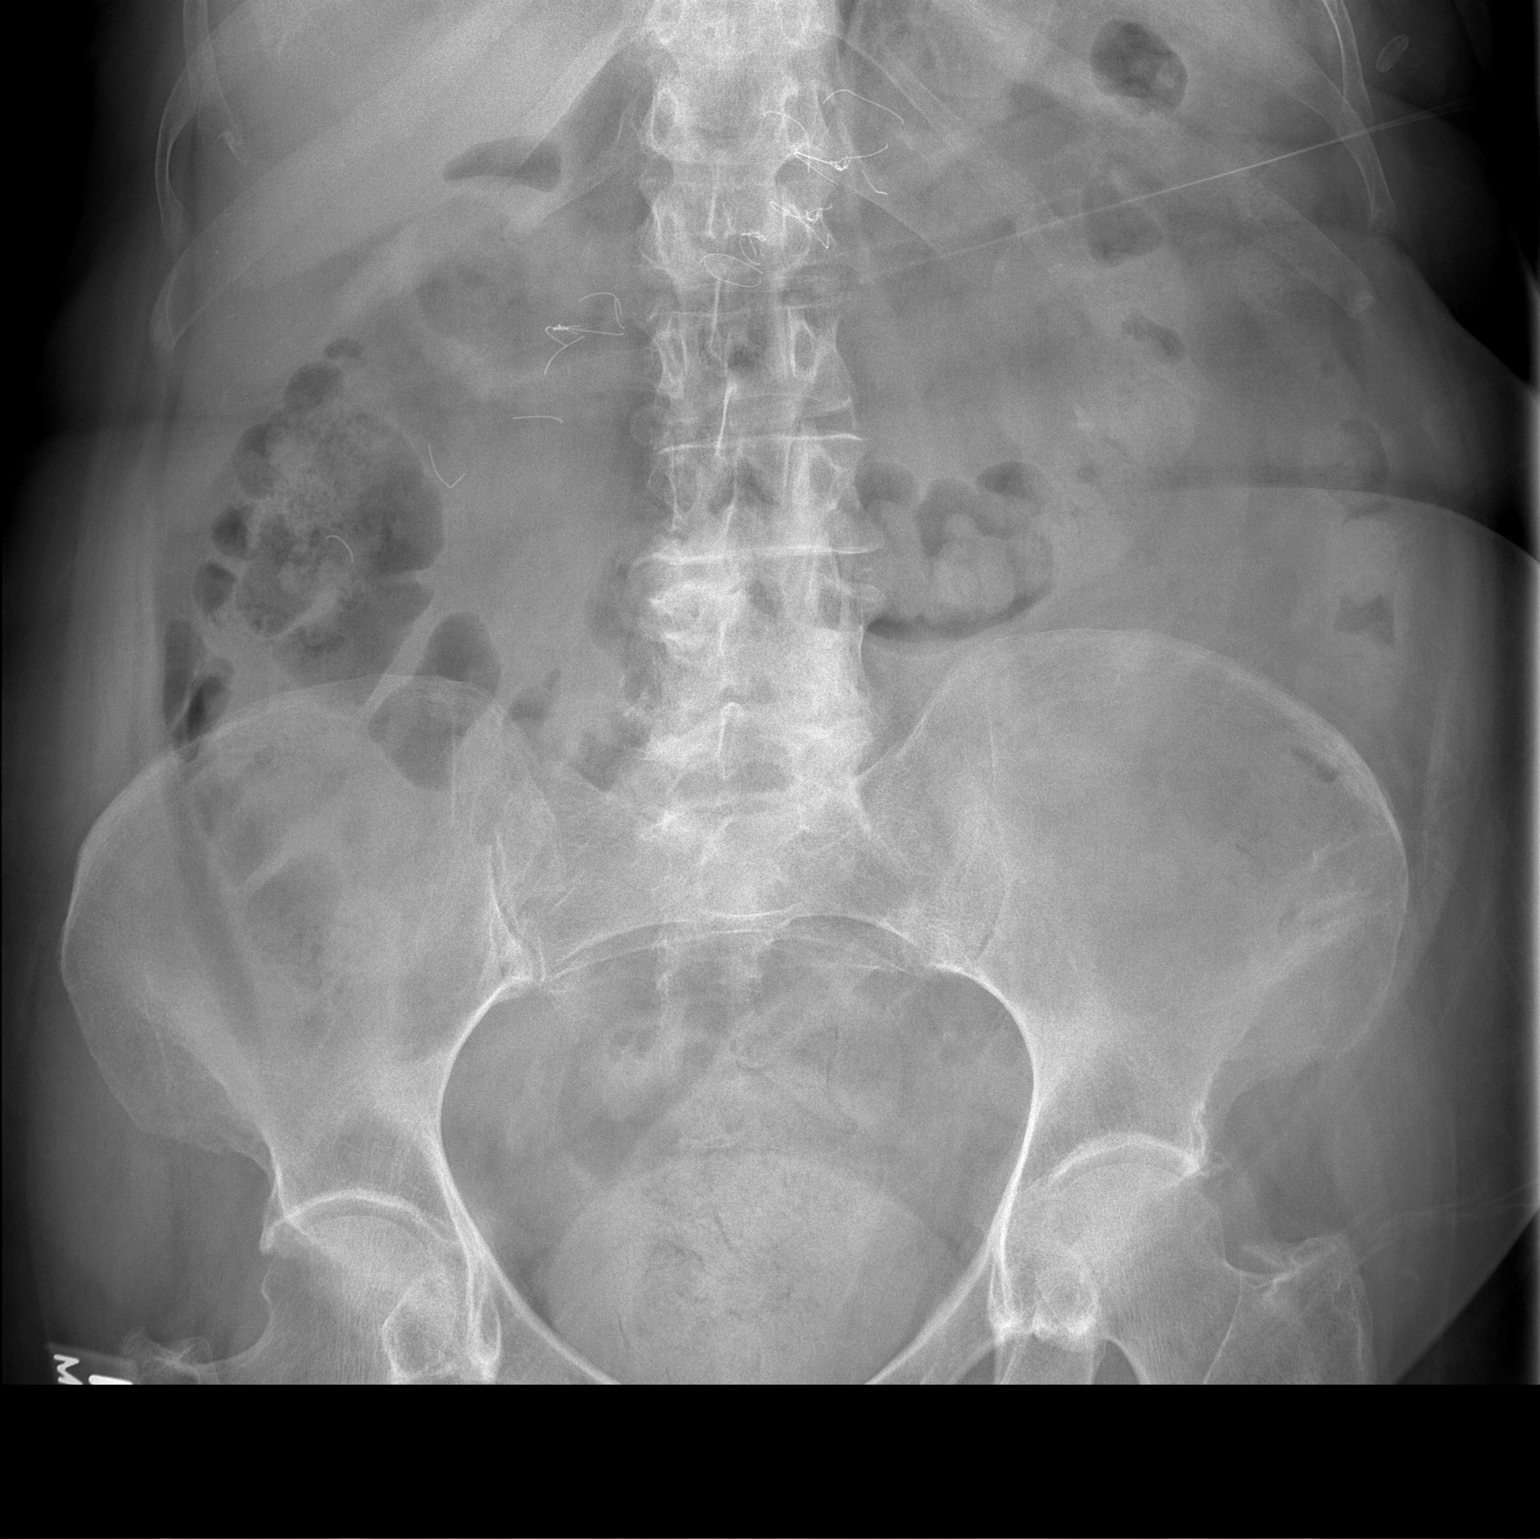

[t abdomen supine (2 of 2)]
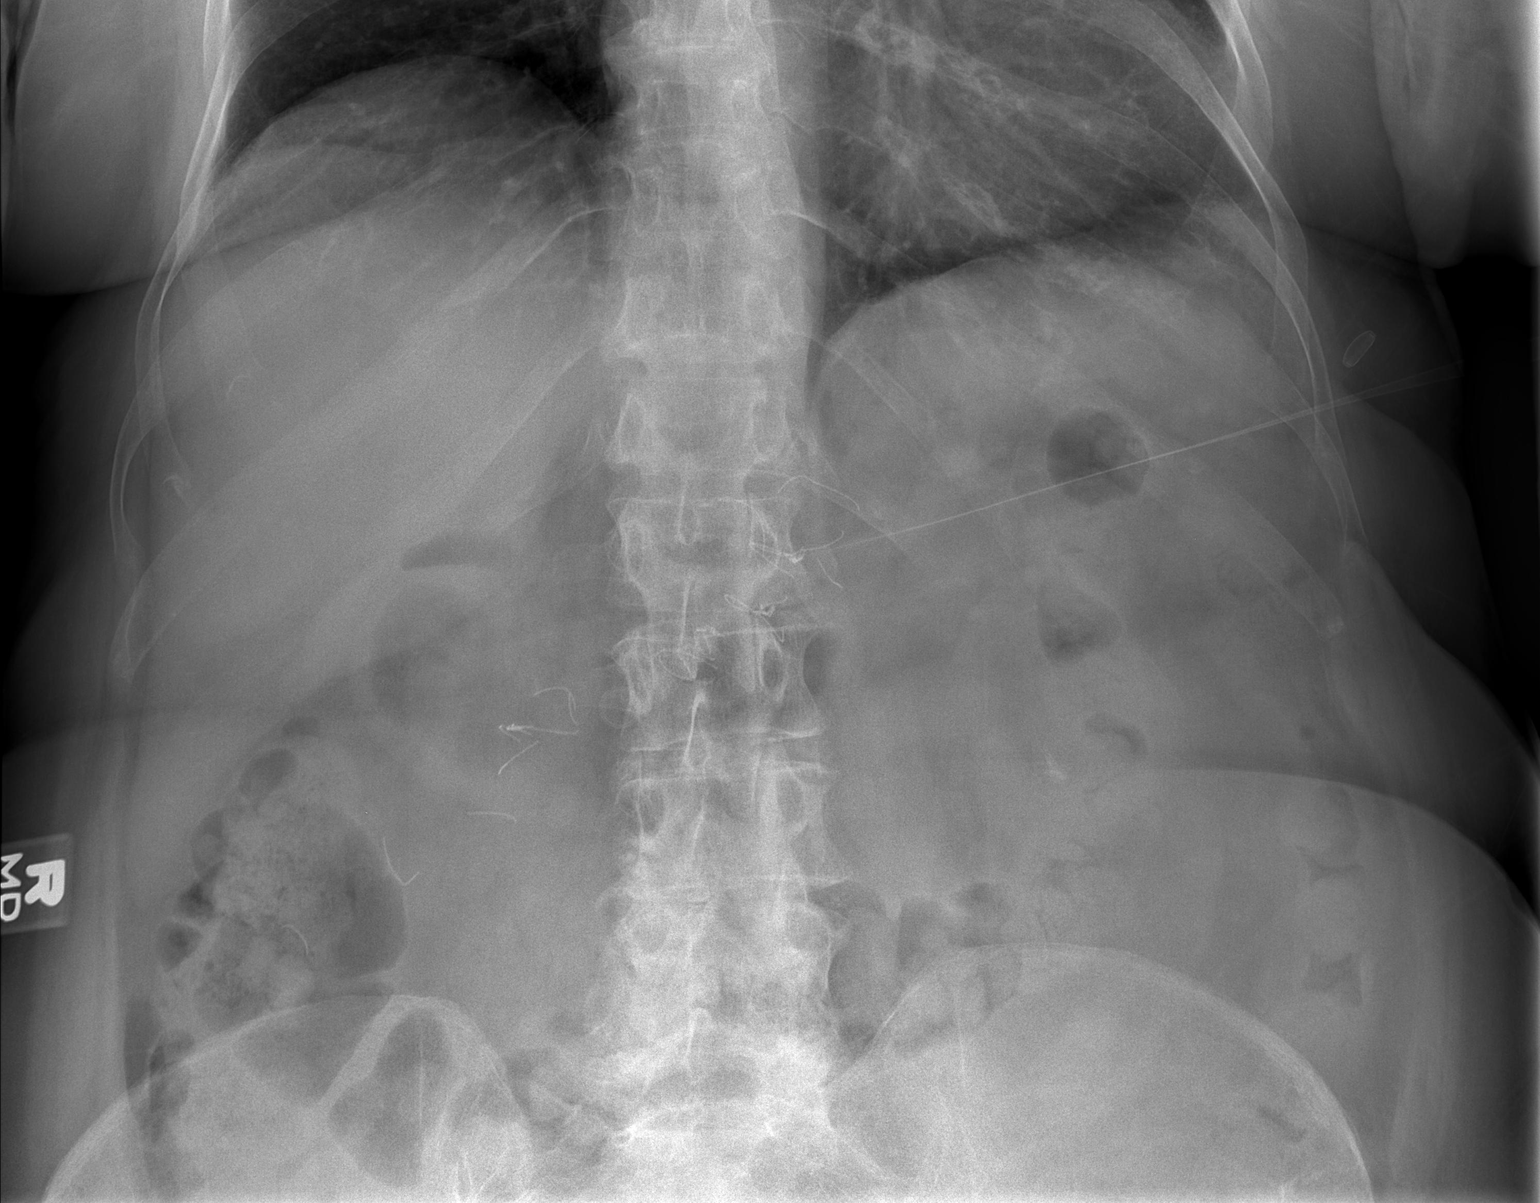

[2 of 2 positions shown; findings below may reference images not displayed]

FINDINGS: No dilated loops of large or small bowel.  Left
nephrolithiasis corresponds to the CT.  Surgical clips in the right
upper abdominal wall again demonstrated.  The small ureteral
calculus described on comparison CT is below the size detection of
plain film.
IMPRESSION: 1.  Left nephrolithiasis similar to comparison CT.
2.  No evidence of bowel obstruction.

## 2010-10-15 NOTE — Op Note (Signed)
NAMEGREENLEE, [name]                 ACCOUNT NO.:  [id number]   MEDICAL RECORD NO.:  [id number]          PATIENT TYPE:  AMB   LOCATION:  SDS                          FACILITY:  [facility]   PHYSICIAN:  Mark C. Ophelia Charter, M.D.    DATE OF BIRTH:  1920-10-20   DATE OF PROCEDURE:  08/09/2007  DATE OF DISCHARGE:                               OPERATIVE REPORT   PRE AND POSTOPERATIVE DIAGNOSIS:  Right shoulder complete rotator cuff  tear, biceps anchor labral tear and painful ulnar plate, right forearm.   PROCEDURE:  Diagnostic operative arthroscopy, right shoulder.  Exam  under anesthesia, arthroscopic debridement of rotator cuff tear and  labral tear and biceps tendon anchor with biceps release.  Shoulder  chondroplasty for glenohumeral arthropathy.  Mini open rotator cuff  repair and open biceps tenodesis.  Right ulnar plate removal.  Partial  distal clavicle excision.   ANESTHESIA:  Preoperative scalene block plus general anesthesia plus 5  mL local, right shoulder.   TOURNIQUET TIME:  24 minutes x 250 for ulnar plate removal.   PROCEDURE:  After induction of general anesthesia with orotracheal  intubation, preoperative block, IV antibiotic Ancef preoperatively,  standard prep and drape was performed.  Time-out procedure was taken and  shoulder was prepped from neck to the fingertips with the patient in  beach-chair position with careful padding and positioning.  The usual  split sheets, drapes, impervious stockinette and Coban was applied and  then a time-out procedure.  And the arthroscope was introduced first to  the shoulder after exam under anesthesia showing no evidence of  instability.  There was extensive glenohumeral arthropathy and the  anterior portal was made inferior to the biceps tendon for debridement.  There was extensive labral tear and this required significant  debridement, biceps tendon anchor was torn, detached and biceps release  was performed.  Undersurface of rotator  cuff showed extensive tearing.  This was trimmed back.  No Bankart lesion no Hill-Sachs lesion.   After extensive debridement was finished in the shoulder, incision was  made on the anterior aspect of the acromion.  Acromioplasty was  performed using a three-quarter straight osteotome and then smoothing it  with a file.  Distal aspect of the clavicle was prominent impinging and  combination of osteotome, rongeur and then the file was used to resect a  portion of the inferior aspect of the clavicle which was contributing to  the impingement.  Coracoacromial ligament was divided and small bleeders  controlled with the Bovie electrocautery.  Resection was performed due  to hypertrophic bursal tissue and this exposed the complete rotator cuff  tear which was off the supraspinatus.  Cuff was cut back.  There were  changes consistent with previous cortisone injections present.  Bone was  freshened up with the rongeur and two Ultrafix anchors were placed and  multiple sutures were placed with horizontal mattress fashion, repairing  the rotator cuff down to bleeding bone surface and then oversewing it  laterally through drill holes in bone made with a towel clip.  Shoulder  was rotated and no remaining  tear of the rotator cuff was present.  Arm  was flexed to 100 degrees and bicipital groove was then exposed. A right-  angle clamp was used to flip the biceps tendon up.  A centimeter was cut  off. Fiber[employer].  K-wire was drilled down into the groove.  Sizing the tendon was consistent with a 7 and a 7 x 23 biotenodesis  screw was then inserted with the tendon, going  in the hole first and then tightening the tendon down until it was  underneath the cortical surface.  There is some difficulty getting the  screw to advance but it popped through and then the remaining other  suture was tied together for secondary lock.  After irrigation with  saline solution, deltoid was repaired back  through holes in the bone of  the chromium with the #1 suture from the Ultrafix anchors.  Split in the  deltoid which was 1 cm, was repaired with loose sutures.  2-0 Vicryl in  the subcutaneous tissue, 4-0 Vicryl subcuticular closure.  Nylon portal  closure.  Needle count, sponge count was correct.  Forearm was then  exposed unwrapping the Coban and removing stockinette.  The patient had  prepping all the way to the fingertips at the beginning of the procedure  and a skin marker was used in the old ulnar incision and incision was  opened.  Bovie electrocautery was used along the edge of the soft tissue  adjacent to the plate.  Subperiosteal dissection over the plate and then  removal of the six screws, removal of plate, irrigation and then layered  closure with 2-0 Vicryl in subcutaneous tissue, 4-0 Vicryl subcuticular  closure.  Tincture of Benzoin, Steri-Strips.  Marcaine was infiltrated  in the shoulder incision.  Tweeners were placed in the fingers, 4x4s and  Webril followed by an Ace wrap soft dressing.  Shoulder was closed and  4x4s were applied to the incisions, ABD tape and then a sling.  Instrument count and needle count was correct.      Mark C. Ophelia Charter, M.D.  Electronically Signed     MCY/MEDQ  D:  08/09/2007  T:  08/10/2007  Job:  [id number]
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
## 2010-10-18 NOTE — Discharge Summary (Signed)
NAMEKILEE, HEDDING                 ACCOUNT NO.:  [id number]   MEDICAL RECORD NO.:  [id number]          PATIENT TYPE:  OBV   LOCATION:  9308                          FACILITY:  [facility]   PHYSICIAN:  Juluis Mire, M.D.   DATE OF BIRTH:  1921-01-28   DATE OF ADMISSION:  04/03/2004  DATE OF DISCHARGE:                                 DISCHARGE SUMMARY   ADMITTING DIAGNOSIS:  Cystic enlargement of the right adnexa.   POSTOPERATIVE DIAGNOSIS:  Paratubal cyst.   OPERATIVE PROCEDURE:  Open laparoscopy with lysis of adhesions and right  salpingo-oophorectomy.   For complete history and physical, please see dictated note.   COURSE IN THE HOSPITAL:  The patient underwent above-noted surgery,  pathology pending.  Postoperatively, did well.  Discharged on postoperative  day #1 afebrile and stable vital signs.  Abdomen soft, nontender, bowel  sounds are active.  All incisions were clear.   COMPLICATIONS:  None encountered during stay in the hospital.  The patient  was discharged home in stable condition.   DISPOSITION:  Routine postoperative instructions were given.  She is to call  with fever, nausea, vomiting, or increasing abdominal pain.  Limited  activity discussed.  Follow up in the office in 1 week.      JSM/MEDQ  D:  04/04/2004  T:  04/04/2004  Job:  [id number]

## 2010-10-18 NOTE — Discharge Summary (Signed)
NAMEMELESSIA, KAUS                 ACCOUNT NO.:  [id number]   MEDICAL RECORD NO.:  [id number]          PATIENT TYPE:  INP   LOCATION:  0451                         [facility]ACILITY:  [facility]   PHYSICIAN:  Almedia Balls. Fore, M.D.   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  The remainder of her history and physical are as  previously dictated.   LABORATORY DATA:  Preoperative hemoglobin 12.9, 8900 white blood cells, BMET  panel was within normal limits.  Chest x-ray was normal.  Electrocardiogram  showed first degree block with right bundle branch block and left anterior  fascicular block.   HOSPITAL COURSE:  The patient was taken to the operating room on June 25, 2004 at which time exploratory laparotomy, extensive lysis of adhesions and  left salpingo-oophorectomy  were performed.  The patient did well  postoperatively except for difficulty with pain management.  Diet,  ambulation were progressed over the next several days postoperatively.  On  the morning of June 28, 2004, she was afebrile, experiencing no problems  and was on a medication which would manage her pain well.  It was felt that  she could be discharged at this time.   FINAL DIAGNOSES:  1.  Pelvic pain.  2. Left ovarian cyst.  3. Left tubal cyst.  4. Extensive      adhesions.   OPERATION:  Exploratory laparotomy, extensive lysis of adhesions, left  salpingo-oophorectomy.  Pathology report showed inclusion cyst of the left  ovary and left peritubal cyst as well as junctional nevus of the skin  segment that had been removed.   DISPOSITION:  Discharged home.  Return to the office in two weeks for  followup.   DISCHARGE INSTRUCTIONS:  Gradually progress  her activities over several  weeks at home and to limit lifting and driving for two weeks.  She was fully  ambulatory, on a regular diet, and in good condition at the time of  discharge.   DISCHARGE MEDICATIONS:  1.  Mepergan Fortis, generic, #30 to be taken 1-2 q.6-8h. p.r.n. pain.  2.      Keflex 250 mg, #30, to be taken two stat and 1 q.i.d.  3. Doxycycline      100 mg, #12, to be taken 1 b.i.d.  She will call for any problems.      SRF/MEDQ  D:  06/28/2004  T:  06/29/2004  Job:  [id number]

## 2010-10-18 NOTE — H&P (Signed)
NAMEBUNNY, KLEIST                 ACCOUNT NO.:  [id number]   MEDICAL RECORD NO.:  [id number]          PATIENT TYPE:  AMB   LOCATION:  SDC                           FACILITY:  [facility]   PHYSICIAN:  Juluis Mire, M.D.   DATE OF BIRTH:  07-12-1920   DATE OF ADMISSION:  04/03/2004  DATE OF DISCHARGE:                                HISTORY & PHYSICAL   HISTORY OF PRESENT ILLNESS:  [age]year-old gravida 3, para 3,  postmenopausal female, who presents for laparoscopic bilateral salpingo-  oophorectomy.  In relation to the present admission, the patient was  initially seen in our practice on January 31, 2004 for routine evaluation,  was having vague lower abdominal pain and swelling.  Subsequent ultrasound  evaluation revealed the presence of a 4- to 5-cm simple cyst of the right  ovary; this has persisted on followup ultrasound.  CA125s have been normal.  In view of persistent cystic enlargement, the patient has decided to proceed  with laparoscopic removal.  She wishes to have both ovaries removed at the  present time.  We have discussed the option of conservative evaluation in  view of size and negative CA125; this is declined by patient.   ALLERGIES:  In terms of allergies, she is allergic to CODEINE.   MEDICATIONS:  Medications include Premarin and Ativan.   PAST MEDICAL HISTORY:  1.  Does have the usual childhood diseases.  2.  Does have a history of a thyroid nodule in 1964.   PAST SURGICAL HISTORY:  1.  She has had a total abdominal hysterectomy.  2.  She has a cholecystectomy.  3.  Appendectomy.  4.  She has had a benign tumor removed from the throat.  5.  She has had 2 back surgeries as well as arm surgery.   OBSTETRICAL HISTORY:  She has had 3 spontaneous vaginal deliveries.   FAMILY HISTORY:  Sister and father have a history of diabetes.  Father has a  history of hypertension.  There is a history of heart disease in her sister  and mother.   SOCIAL HISTORY:  No  tobacco or alcohol use.   REVIEW OF SYSTEMS:  Review of systems is noncontributory.   PHYSICAL EXAM:  VITAL SIGNS:  The patient is afebrile with stable vital  signs.  HEENT:  Patient is normocephalic.  Pupils are equal, round and reactive to  light and accommodation.  Extraocular movements were intact.  Sclerae and  conjunctivae were clear.  Oropharynx clear.  NECK:  Neck without thyromegaly.  BREASTS:  Not examined.  LUNGS:  Clear.  CARDIOVASCULAR SYSTEM:  Regular rate.  No murmurs or gallops.  ABDOMEN:  Exam is benign.  Abdominal incisions are noted.  No mass or  organomegaly.  PELVIC:  Normal external genitalia.  Vaginal mucosa is clear.  Cuff intact.  It is hard to feel the ovarian cyst on pelvic exam.  Rectovaginal exam is  clear.  EXTREMITIES:  Trace edema.  NEUROLOGIC:  Exam is grossly within normal limits.   IMPRESSION:  Cystic enlargement of ovary in a postmenopausal patient.  PLAN:  The patient will undergo laparoscopic bilateral salpingo-  oophorectomy.  The risks of surgery have been discussed including the risk  of infection, the risk of hemorrhage that could require transfusion with  risk of AIDS or hepatitis, risk of injury to adjacent organs including  bladder, bowel or ureters that could require further exploratory surgery,  the risks of deep venous thrombosis and pulmonary embolus.  The potential  inability to approach this laparoscopically has also been explained.  The  patient expressed understanding of indications and risks.      JSM/MEDQ  D:  04/03/2004  T:  04/03/2004  Job:  [id number]

## 2010-10-18 NOTE — Op Note (Signed)
NAMEJANIE, [name]                 ACCOUNT NO.:  [id number]   MEDICAL RECORD NO.:  [id number]          PATIENT TYPE:  INP   LOCATION:  0008                         FACILITY:  [facility]   PHYSICIAN:  Almedia Balls. Fore, M.D.   DATE OF BIRTH:  [birth year]   DATE OF PROCEDURE:  06/25/2004  DATE OF DISCHARGE:                                 OPERATIVE REPORT   PREOPERATIVE DIAGNOSES:  Pelvic pain, rule out ovarian malignancy, probable  adhesions.   POSTOPERATIVE DIAGNOSES:  Pelvic pain, rule out ovarian malignancy, probable  adhesions.  Pending pathology plus suspicious mole at incision site.   OPERATION:  Exploratory laparotomy, excision of suspicious cutaneous mole,  extensive lysis of adhesions, left salpingo-oophorectomy.   ANESTHESIA:  General oral tracheal.   SURGEON:  Almedia Balls. Randell Patient, M.D.   FIRST ASSISTANT:  Leona Singleton, M.D.   INDICATIONS FOR PROCEDURE:  The patient is an 77 year old with the above  noted problems who was counseled as to the need for surgery and the type of  surgery to be performed.  She was fully counseled as to the risks involved  to include risks of anesthesia, injury to bowel, bladder, blood vessels,  ureters, postoperative hemorrhage, infection, recuperation.  She fully  understands all these considerations and has signed informed consent to  proceed on June 25, 2004.   FINDINGS:  On entry into the abdomen, a number of adhesions were encountered  including bowel to bowel adhesions, bowel to lateral peritoneal surface  adhesions and bowel to left tube and ovary adhesions.  Prior to entry, there  was noted to be a mole within in an area of excision for the previous  surgical scar which was sent for pathologic study.  The uterus, right tube  and ovary had previously been excised. The patient was also status post  cholecystectomy so there were marked adhesions in the upper abdomen making  exploration difficult.   DESCRIPTION OF PROCEDURE:  With the patient  under general anesthesia,  prepped and draped in the usual sterile fashion, with the Foley catheter in  the bladder, a lower abdominal transverse incision was made after excising  the previous surgical incision and the mole as noted above. The incision was  carried into the peritoneal cavity without difficulty.  There were a number  of adhesions involving loops of bowel to one another to the lateral  peritoneal surface and to the left tube and ovary.  These were gradually  lysed with care.  This portion of the procedure required approximately 45  minutes to lyse the adhesions.  It was the possible to elevate the left tube  and ovary away from the lateral peritoneal surface and the ureter and  vessels.  A Heaney clamp was placed across the infundibulopelvic ligament  which was then cut and doubly ligated with #1 chromic catgut. The area was  lavaged with copious amounts of lactated Ringer's solution, and after noting  that hemostasis was maintained and that sponge and instrument counts were  correct, the peritoneum was closed with a continuous sutures of #0 Vicryl.  The fascia was closed  with two sutures of #0 Vicryl which were brought from  the lateral aspects of the incision and tied separately in the midline.  The  subcutaneous fat was reapproximated with interrupted horizontal mattress  sutures of  #0 Vicryl.  The skin was closed with a subcuticular suture of 3-0 plain  catgut.  Estimated blood loss 100 mL.  The patient was taken to the recovery  room in good condition with clear urine and a Foley catheter tubing. She  will be admitted following surgery.      SRF/MEDQ  D:  06/25/2004  T:  06/25/2004  Job:  [id number]   cc:   Leona Singleton, M.D.  [address]  [place]  [place] [address]  Fax: [phone]

## 2010-10-18 NOTE — H&P (Signed)
NAMECHANTELE, [name]                 ACCOUNT NO.:  [id number]   MEDICAL RECORD NO.:  [id number]           PATIENT TYPE:   LOCATION:                                 FACILITY:   PHYSICIAN:  Almedia Balls. Fore, M.D.   DATE OF BIRTH:  10-22-1920   DATE OF ADMISSION:  06/25/2004  DATE OF DISCHARGE:                                HISTORY & PHYSICAL   CHIEF COMPLAINT:  Pain.   HISTORY:  The patient is an 77 year old status post hysterectomy and right  salpingo-oophorectomy by laparoscopy with left ovarian mass and severe pain  in this side.  She states that she underwent hysterectomy in 1964 at which  time the uterus and one half an ovary were removed.  She is not certain  which ovary this was.  She underwent laparoscopy by another physician in  November of 2005 for removal of right tube and ovary.  At this time, there  was noted to be extensive adhesion formation in the area of the left adnexal  structures such that the surgeon could not remove the left tube and ovary.  The patient has had continuous pain which has become very severe limiting  her ability to move and perform normal activities.  She is admitted at this  time for exploratory laparotomy, probable left salpingo-oophorectomy and  indicated procedures.  She has been fully counseled as to the nature of the  procedure and the risks involved to include risks of anesthesia, injury to  bowel, bladder, blood vessels, ureters, postoperative hemorrhage, infection,  recuperation.  She fully understands all these considerations and wishes to  proceed on June 25, 2004.   PAST MEDICAL HISTORY:  Includes the hysterectomy as noted above in 1964  following which she was hospitalized for three months with possible  hyperthyroidism and excision of a tumor in her throat.  She has also  undergone previous cholecystectomy at which time, she had a postoperative  infection and required prolonged hospitalization and antibiotics.  She had a  fracture  of her left humerus for which she had to wear a cast for  approximately 2-1/2 years in the past.  She has been hospitalized in  December of 2005 with pneumonia, hospitalized in the summer of 2005 with  vertigo and then the laparoscopy and right salpingo-oophorectomy in November  of 2005.  She takes Ativan 0.5 mg h.s. She is allergic to penicillin and  codeine.   FAMILY HISTORY:  Includes father and sister with type 2 diabetes mellitus.  Mother and father with cardiovascular disease, sister who died at age 25  with some sort of cancer in her throat.   REVIEW OF SYSTEMS:  HEENT:  Wears glasses.  CARDIORESPIRATORY:  Has  questionable vascular related cramping in her legs.  History of rheumatic  fever but no definite sequela from this.  The asthma and pneumonia as noted  above which have been recurrent.  GASTROINTESTINAL:  Only as related to the  gallbladder and possible adhesions in her abdomen.  GENITOURINARY:  As noted  above.  NEUROMUSCULAR:  History of surgery for probable herniated disk  with  nerve injury which required the patient to be on a walker for several months  postoperatively.  She now functions normally but has pain in her left lower  extremity.   PHYSICAL EXAMINATION:  VITAL SIGNS:  Height 5 feet 4-3/4 inches, weight 169  pounds, blood pressure 130/72, pulse 88, respirations 18.  GENERAL:  A well-developed white female in no acute distress.  HEENT:  Within normal limits.  NECK:  Supple without masses, adenopathy or bruits.  HEART:  Regular rate and rhythm without murmurs.  LUNGS:  Clear to percussion and auscultation.  BREASTS:  Exam sitting and lying without masses.  Axilla negative.  ABDOMEN:  Flat and soft.  No palpable masses.  Tender in the left lower  quadrant without rebound.  PELVIC:  External genitalia, Bartholin's, urethra and Skene's glands show  atrophic changes.  Vagina is atrophic.  Cervix and uterus are previously  surgically absent.  Adnexal exam reveals  no mass on the right side, slightly  tender.  Left adnexal area is tender and with fullness.  RECTOVAGINAL:  Exam is confirmatory.  EXTREMITIES:  Within normal limits.  CENTRAL NERVOUS SYSTEM:  Grossly intact.  SKIN:  Without suspicious lesions.   IMPRESSION:  Severe pelvic pain, question retained ovary syndrome.   DISPOSITION:  As noted above.       ___________________________________________  Almedia Balls. Randell Patient, M.D.    SRF/MEDQ  D:  06/20/2004  T:  06/20/2004  Job:  [id number]

## 2010-10-18 NOTE — Op Note (Signed)
NAMEJAICEY, SWEANEY                 ACCOUNT NO.:  [id number]   MEDICAL RECORD NO.:  [id number]          PATIENT TYPE:  OBV   LOCATION:  9399                          FACILITY:  [facility]   PHYSICIAN:  Juluis Mire, M.D.   DATE OF BIRTH:  01/27/1921   DATE OF PROCEDURE:  04/03/2004  DATE OF DISCHARGE:                                 OPERATIVE REPORT   PREOPERATIVE DIAGNOSIS:  Cystic enlargement of the right ovary.   POSTOPERATIVE DIAGNOSIS:  Pelvic adhesions with para-ovarian cyst on the  right side.   OPERATIVE PROCEDURES:  1.  Open laparoscopy.  2.  Lysis of adhesions.  3.  Right salpingo-oophorectomy.   SURGEON:  Juluis Mire, M.D.   ANESTHESIA:  General endotracheal.   ESTIMATED BLOOD LOSS:  Minimal.   PACKS AND DRAINS:  None.   INTRAOPERATIVE BLOOD REPLACEMENT:  None.   COMPLICATIONS:  None.   INDICATIONS:  Were dictated in History and Physical.   PROCEDURE IN DETAIL:  The patient was taken to the OR placed in the supine  position.  After satisfactory general endotracheal anesthesia obtained, the  patient was placed in the dorsal lithotomy position using the Allen  stirrups.  The abdomen, perineum, and vagina were prepped with Betadine.  The bladder was emptied by in-and-out catheterization.  Sponge on a sponge  stick was placed in the vaginal vault.  The patient was draped out as a  sterile field.  Sub-umbilical incision made with the knife and carried  through to the subcutaneous tissue.  The fascia was then entered sharply.  Incision in the fascia extended laterally.  The rectus muscles were  separated.  Peritoneum was elevated and entered.  Palpation revealed no peri-  umbilical adhesions.  The taut open laparoscopic trocar was put in place and  secured.  The abdomen was inflated with carbon dioxide.  The laparoscope was  introduced.  There was no evidence of injury to adjacent organs.  A 5 mm  trocar was placed in the suprapubic area and left lower quadrant  after  visualization of the epigastric vessels.  The patient was placed in  Trendelenburg position.  The right ovary was elevated and had approximately  a 5 cm para-ovarian cyst.  The ovary was then elevated along with the para-  ovarian cyst.  The right ureter was identified.  We then, using the Gyrus  bipolar cauterized and incised the right ovarian vasculature as well as the  mesenteric attachment of the ovary.  The ovary and the para-ovarian cyst  were then removed through the sub-umbilical port and sent for pathologic  review.  Revisualization of the area revealed the ureter again to be out of  the operative field.  We secured hemostasis on this side.   We then went to the left side.  The left ovary was encased in the sigmoid  colon.  We went about trying to dissect the colon away from the left pelvic  sidewall.  Due to dense adhesions, we were unable to completely free this  area; however, we were able to visualize the ovary.  It  was small and  atrophic.  We did not persist at this point in time.  We brought about  hemostasis with the omentum in that area with the Gyrus.  At this point in  time we had good hemostasis bilaterally.  The pelvic cavity was thoroughly  irrigated.  There was no injury to the adjacent organs.  The abdomen was  deflated of carbon dioxide, all trocars removed.  The sub-umbilical fascia  was closed with two figure-of-eights of 0 Vicryl, the skin with interrupted  subcuticulars of 4-0 Vicryl.  The suprapubic incisions were closed with  Dermabond.  The sponge on the sponge stick was removed from the vaginal  vault.  The patient taken out of the dorsal lithotomy position, __________  extubated, transferred to recovery room in good condition.  Sponge,  instrument, needle count reported as correct by circulating nurse.      JSM/MEDQ  D:  04/03/2004  T:  04/03/2004  Job:  [id number]

## 2010-10-18 NOTE — Op Note (Signed)
NAMELINDI, ABRAM                 ACCOUNT NO.:  [id number]   MEDICAL RECORD NO.:  [id number]          PATIENT TYPE:  INP   LOCATION:  0008                         FACILITY:  [facility]   PHYSICIAN:  Almedia Balls. Fore, M.D.   DATE OF BIRTH:  07/09/1920   DATE OF PROCEDURE:  06/25/2004  DATE OF DISCHARGE:                                 OPERATIVE REPORT   PREOPERATIVE DIAGNOSIS:  Status post total abdominal hysterectomy and  subsequent right salpingo-oophorectomy with pelvic pain, retained left  ovary, probable adhesions.   POSTOPERATIVE DIAGNOSIS:  Status post total abdominal hysterectomy and  subsequent right salpingo-oophorectomy with pelvic pain, retained left  ovary, extensive pelvic adhesions.   OPERATIONS:  1.  Exploratory laparotomy.  2.  Extensive lysis of adhesions.  3.  Left salpingo-oophorectomy.   ANESTHESIA:  General orotracheal.   OPERATOR:  Almedia Balls. Randell Patient, M.D.   FIRST ASSISTANT:  Leona Singleton, M.D.   INDICATIONS FOR SURGERY:  The patient is an 77 year old who has had the  above-noted problems and was found to have the dense adhesions in the left  adnexal area with a laparoscopy in November of 2005 by another physician.  She presented in our office with complaints of severe pain and inability to  perform normal activities without pain.  We have suggested that she undergo  the above-noted surgery and have discussed the surgery fully, as well as  complications and risks to include risks of anesthesia, injury to bowel,  bladder, blood vessels, ureters, postoperative hemorrhage, infection and  recuperation.  She fully understands all of these considerations and wishes  to proceed on June 25, 2004.  She has signed informed consent for this  procedure.   OPERATIVE FINDINGS:  On entry into the abdominal cavity, there were dense  adhesions involving loops of descending rectosigmoid to the lateral  peritoneal surface and to each other.  Deep in the left pelvis, the left  tube and ovary were adherent to the lateral peritoneal surface with the tube  being twisted around the ovary and adherent as well.  The ovary and tube  were sent for frozen section and returned with benign findings.   DESCRIPTION OF PROCEDURE:  With the patient under general anesthesia,  prepared and draped in the usual sterile fashion, with the Foley catheter in  the bladder, a lower abdominal transverse incision was made after excising  the previous surgical scar.  There was a mole which appeared to be  suspicious within the confines of the excised skin and this was sent for  pathologic study as well.  The incision was carried into the peritoneal  cavity without difficulty.  Pelvic washings were taken and a self-retaining  retractor was placed.  The bowel as packed off.  Careful dissection was  carried out to the area of the left tube and ovary.  Multiple loops of bowel  were adherent to one  another and this required very delicate dissection and very tedious lysis of  adhesions.  It was then possible to identify the left tube and ovary   Dictation ended at this  point.      SRF/MEDQ  D:  06/25/2004  T:  06/25/2004  Job:  [id number]   cc:   Leona Singleton, M.D.  [address]  [place]  [place] [address]  Fax: [phone]

## 2010-10-21 IMAGING — CR DG CHEST 2V
2 series · 2 of 2 positions shown · non-contrast
Comparison: 08/09/2007

CLINICAL DATA: Left ureteral calculus.  Hypertension.  Preop
respiratory exam.

CHEST - 2 VIEW

[w chest pa]
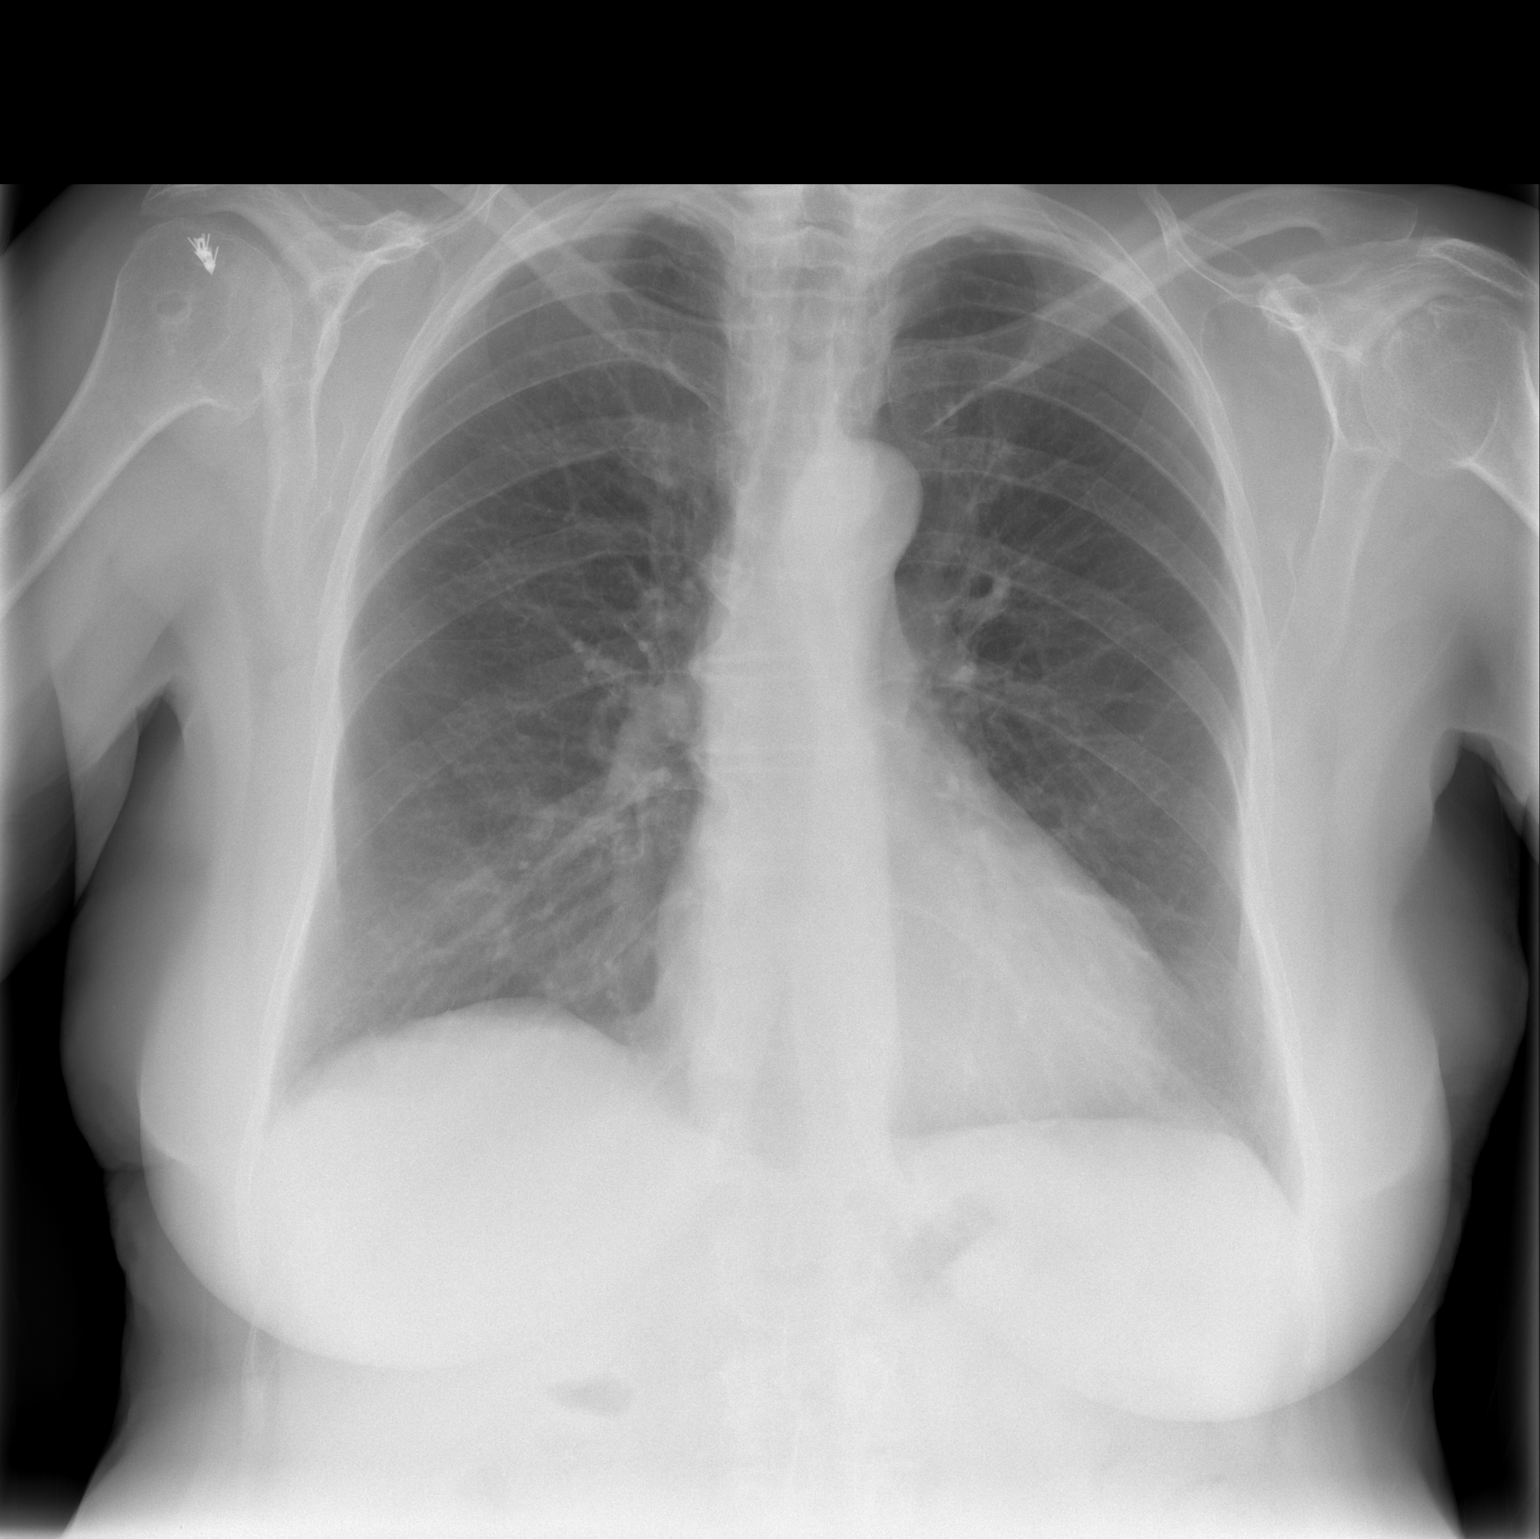

[w chest lat]
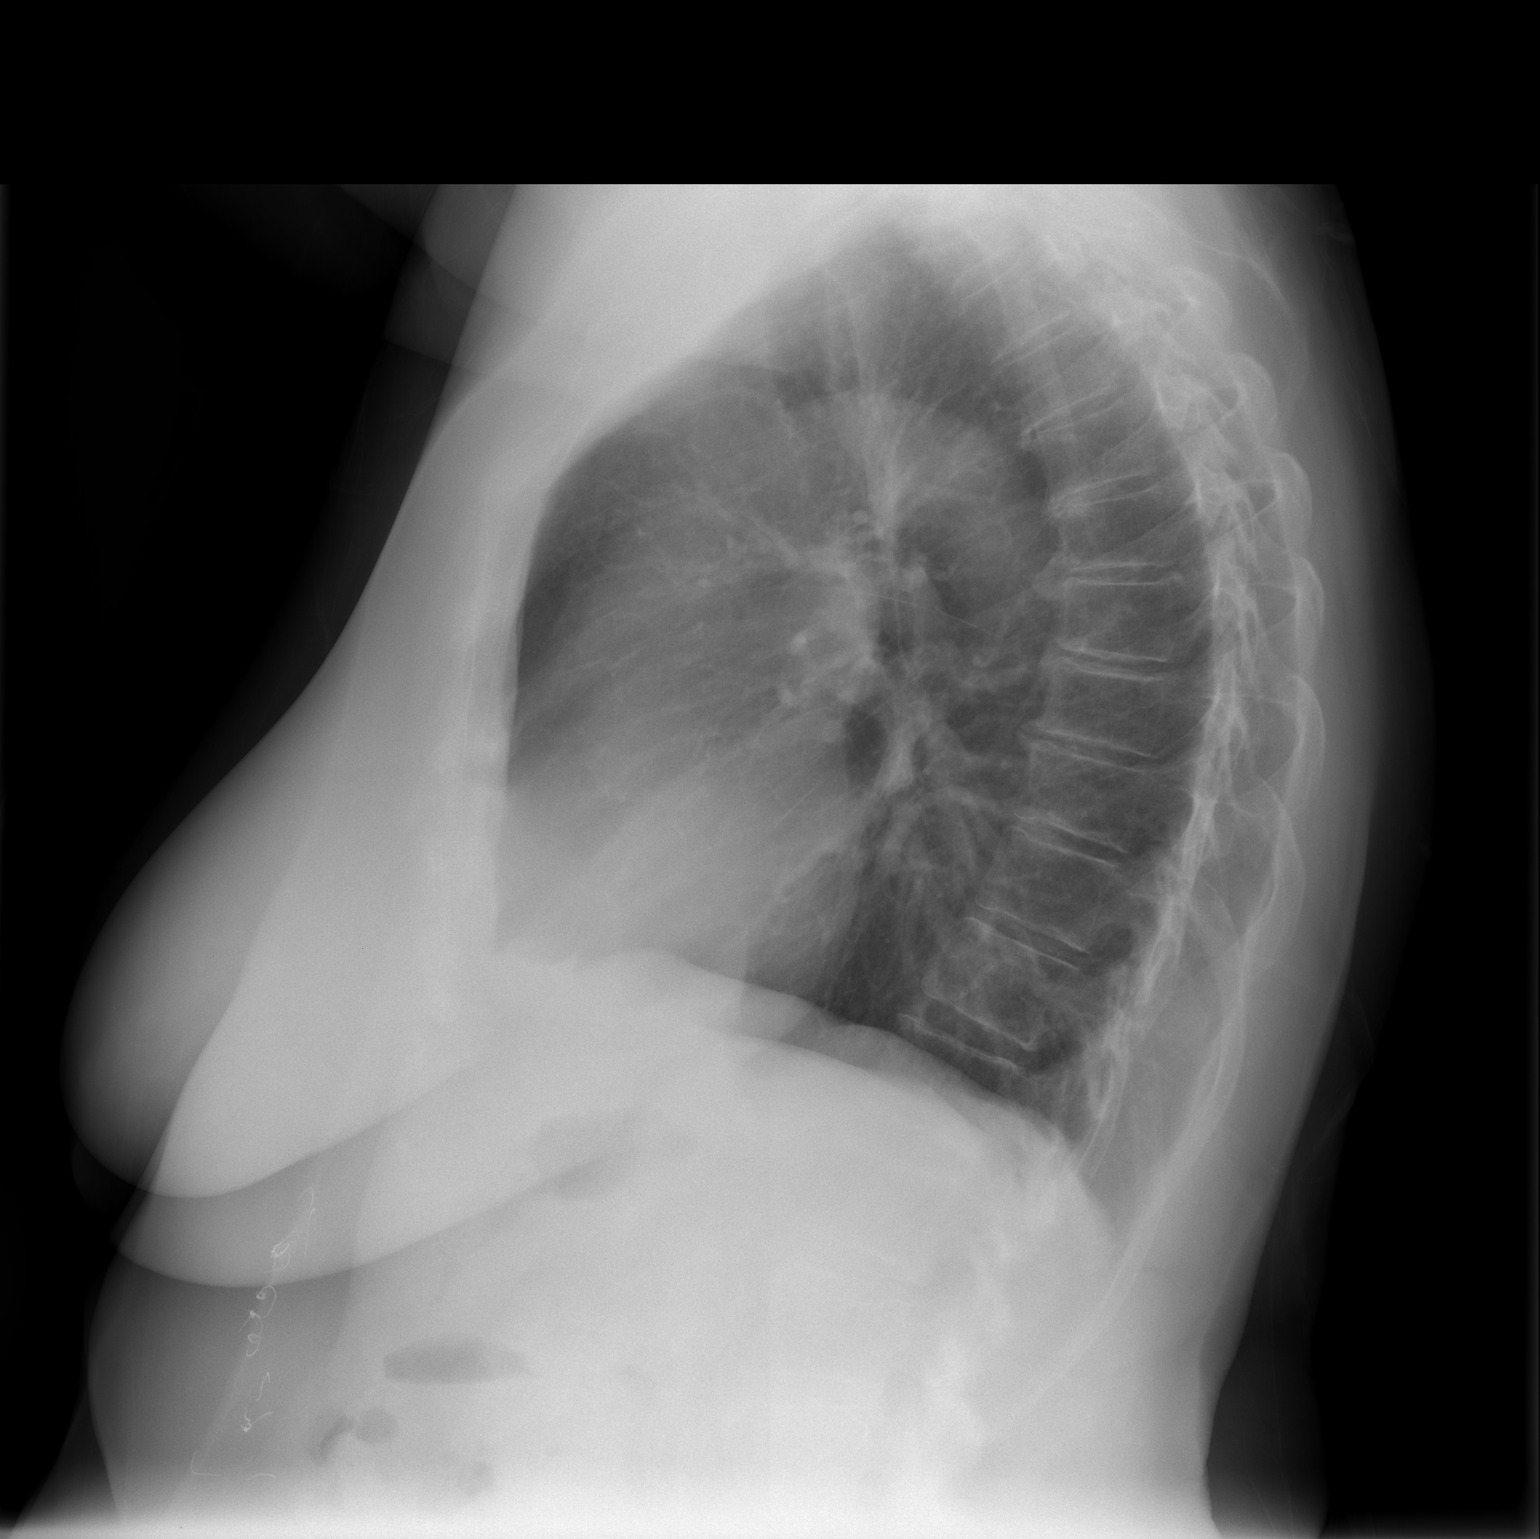

[2 of 2 positions shown; findings below may reference images not displayed]

FINDINGS: Heart size is within normal limits.  Both lungs are
clear.  No evidence of pleural effusion.  No mass or adenopathy
identified.
IMPRESSION: No active cardiopulmonary disease.

## 2010-10-28 ENCOUNTER — Emergency Department (HOSPITAL_COMMUNITY)
Admission: EM | Admit: 2010-10-28 | Discharge: 2010-10-28 | Disposition: A | Discharge status: Home / Self Care | Payer: Medicare Other | Attending: Emergency Medicine | Admitting: Emergency Medicine

## 2010-10-28 ENCOUNTER — Emergency Department (HOSPITAL_COMMUNITY): Payer: Medicare Other

## 2010-10-28 DIAGNOSIS — R0602 Shortness of breath: Secondary | ICD-10-CM | POA: Insufficient documentation

## 2010-10-28 DIAGNOSIS — E039 Hypothyroidism, unspecified: Secondary | ICD-10-CM | POA: Insufficient documentation

## 2010-10-28 DIAGNOSIS — I10 Essential (primary) hypertension: Secondary | ICD-10-CM | POA: Insufficient documentation

## 2010-10-28 DIAGNOSIS — R059 Cough, unspecified: Secondary | ICD-10-CM | POA: Insufficient documentation

## 2010-10-28 DIAGNOSIS — R05 Cough: Secondary | ICD-10-CM | POA: Insufficient documentation

## 2010-10-28 DIAGNOSIS — J40 Bronchitis, not specified as acute or chronic: Secondary | ICD-10-CM | POA: Insufficient documentation

## 2010-10-28 DIAGNOSIS — R079 Chest pain, unspecified: Secondary | ICD-10-CM | POA: Insufficient documentation

## 2010-11-04 ENCOUNTER — Encounter: Payer: Self-pay | Admitting: Internal Medicine

## 2010-11-04 ENCOUNTER — Ambulatory Visit (INDEPENDENT_AMBULATORY_CARE_PROVIDER_SITE_OTHER): Payer: Medicare Other | Admitting: Internal Medicine

## 2010-11-04 ENCOUNTER — Telehealth: Payer: Self-pay | Admitting: Internal Medicine

## 2010-11-04 VITALS — BP 116/60 | HR 71 | Temp 97.9°F | Wt 161.0 lb

## 2010-11-04 DIAGNOSIS — R05 Cough: Secondary | ICD-10-CM

## 2010-11-04 DIAGNOSIS — R053 Chronic cough: Secondary | ICD-10-CM

## 2010-11-04 DIAGNOSIS — L259 Unspecified contact dermatitis, unspecified cause: Secondary | ICD-10-CM

## 2010-11-04 DIAGNOSIS — L309 Dermatitis, unspecified: Secondary | ICD-10-CM

## 2010-11-04 MED ORDER — TRIAMCINOLONE ACETONIDE 0.025 % EX OINT: TOPICAL_OINTMENT | Freq: Two times a day (BID) | CUTANEOUS | Status: DC

## 2010-11-04 NOTE — Telephone Encounter (Signed)
Pt called said the name of the inhaler med is Xopenex HFA 45 mcg 200 meter inhaler, that was given to her at hospital. Dr Rodena Medin had req to get this info.

## 2010-11-05 ENCOUNTER — Other Ambulatory Visit: Payer: Self-pay | Admitting: Internal Medicine

## 2010-11-05 MED ORDER — LEVALBUTEROL TARTRATE 45 MCG/ACT IN AERO: 2.0000 | INHALATION_SPRAY | RESPIRATORY_TRACT | Status: DC | PRN

## 2010-11-08 ENCOUNTER — Other Ambulatory Visit: Payer: Self-pay | Admitting: Internal Medicine

## 2010-11-08 MED ORDER — LEVOTHYROXINE SODIUM 50 MCG PO TABS: 50.0000 ug | ORAL_TABLET | Freq: Every day | ORAL | Status: DC

## 2010-11-08 NOTE — Telephone Encounter (Signed)
Pt has run out of Levothyroxine 50 mg. Pls call into [facility] [address]

## 2010-11-10 DIAGNOSIS — L309 Dermatitis, unspecified: Secondary | ICD-10-CM | POA: Insufficient documentation

## 2010-11-10 DIAGNOSIS — R053 Chronic cough: Secondary | ICD-10-CM | POA: Insufficient documentation

## 2010-11-10 DIAGNOSIS — R05 Cough: Secondary | ICD-10-CM | POA: Insufficient documentation

## 2010-11-10 NOTE — Assessment & Plan Note (Signed)
Chronic cough with associated wheezing. Failed multiple courses of abx and s/p CXR. Pulmonary consult.

## 2010-11-10 NOTE — Assessment & Plan Note (Signed)
Stop neosporin. Begin triamcinilone. Followup if no improvement or worsening

## 2010-11-10 NOTE — Progress Notes (Signed)
  Subjective:    Patient ID: [name], female    DOB: 11-Aug-1920, 77 y.o.   MRN: [id number]  HPI Pt presents to clinic for evaluation of cough. Notes persistent cough and intermittent wheezing despite multiple attempts with abx tx. No associated dyspnea. Has had cxr without evidence of infiltrate. No other alleviating or exacerbating factors. Also notes chronic right arm rash without dermatomal distribution or pain. Attempting neosporin without improvement. No other complaints.  Reviewed pmh, medications and allergies  Review of Systems See HPI     Objective:   Physical Exam  Nursing note and vitals reviewed. Constitutional: She appears well-developed and well-nourished.  HENT:  Head: Normocephalic and atraumatic.  Right Ear: External ear normal.  Left Ear: External ear normal.  Mouth/Throat: Oropharynx is clear and moist. No oropharyngeal exudate.  Eyes: Conjunctivae are normal. No scleral icterus.  Neck: Neck supple.  Cardiovascular: Normal rate, regular rhythm and normal heart sounds.   Pulmonary/Chest: Effort normal and breath sounds normal. No respiratory distress. She has no wheezes. She has no rales.  Neurological: She is alert.  Skin: Skin is warm and dry.       Erythematous papular rash of right arm          Assessment & Plan:

## 2010-11-11 ENCOUNTER — Ambulatory Visit (INDEPENDENT_AMBULATORY_CARE_PROVIDER_SITE_OTHER): Payer: Medicare Other | Admitting: Pulmonary Disease

## 2010-11-11 ENCOUNTER — Encounter: Payer: Self-pay | Admitting: Pulmonary Disease

## 2010-11-11 VITALS — BP 132/70 | HR 74 | Temp 98.1°F | Ht 65.5 in | Wt 162.6 lb

## 2010-11-11 DIAGNOSIS — R05 Cough: Secondary | ICD-10-CM

## 2010-11-11 DIAGNOSIS — R053 Chronic cough: Secondary | ICD-10-CM

## 2010-11-11 NOTE — Assessment & Plan Note (Signed)
Lisinopril induced vs GERD, doubt aspiration Nml CXR 10/28/10 PPI x 4 weeks - omeprazole Trial of qvar Symptomatic therapy OK until then It may take 8-10 weeks for lisinopril induced cough to subside -Monitor BP next visit.

## 2010-11-11 NOTE — Patient Instructions (Signed)
STOP taking Lisinopril  (Zestril) - BP medication Trial of qvar (sample) 1 puff twice daily Take OMEPRAZOLE (PRILOSEC) for reflux for 1 month OK tot ake cough syrup as needed

## 2010-11-11 NOTE — Progress Notes (Signed)
  Subjective:    Patient ID: [name], female    DOB: Sep 01, 1920, 77 y.o.   MRN: [id number]  HPI 77/F,non smoker,  for evaluation of chronic cough since back surgery in  jan' 12. Seen in the emergency department April 16 with nonproductive cough. Chest x-ray without infiltrate. Diagnosed with bronchitis and was placed on a 14 day course of doxycycline and Tessalon Perles p.r.n. Cough.Continues to have cough worse at night. Tessalon does not significant help the cough. Tussionex helps at night. Denies fevers chills sweats shortness of breath or wheezing.  She  states never had hypertension until dr stone diagnosed er earlier this year.. Questions whether she is truly hypertensive. Med review (from pharmacy) does reveal that she takes 10 mg lisinopril daily.  CXR 5/28 wnl Ct abd 5/5 passed renal stone, bases of lungs nml Swallowing - food gets stuck in her chest & has to swallow twice, no obvious heartburn.     Review of Systems  Constitutional: Negative for fever and unexpected weight change.  HENT: Positive for congestion, rhinorrhea, trouble swallowing, dental problem, postnasal drip and sinus pressure. Negative for ear pain, nosebleeds, sore throat and sneezing.   Eyes: Negative for redness and itching.  Respiratory: Positive for cough, chest tightness, shortness of breath and wheezing.   Cardiovascular: Positive for leg swelling. Negative for palpitations.  Gastrointestinal: Negative for nausea and vomiting.  Genitourinary: Negative for dysuria.  Musculoskeletal: Negative for joint swelling.  Skin: Positive for rash.  Neurological: Negative for headaches.  Hematological: Bruises/bleeds easily.  Psychiatric/Behavioral: Positive for dysphoric mood. The patient is not nervous/anxious.        Objective:   Physical Exam Gen. Pleasant, well-nourished, in no distress, normal affect ENT - no lesions, no post nasal drip Neck: No JVD, no thyromegaly, no carotid bruits Lungs: no use  of accessory muscles, no dullness to percussion, clear without rales or rhonchi  Cardiovascular: Rhythm regular, heart sounds  normal, no murmurs or gallops, no peripheral edema Abdomen: soft and non-tender, no hepatosplenomegaly, BS normal. Musculoskeletal: No deformities, no cyanosis or clubbing Neuro:  alert, non focal        Assessment & Plan:

## 2010-11-14 ENCOUNTER — Telehealth: Payer: Self-pay | Admitting: Internal Medicine

## 2010-11-14 NOTE — Telephone Encounter (Signed)
Pt called and went to Dr Vassie Loll re: getting another xray done, per Dr Rodena Medin. Pt said that the doctor, just spoke with her and said that he had reviewed xray from hospital, but did not do any new xray. Pt is wondering why she had to go to this other doctor, if no other xrays were going to be made?

## 2010-11-15 NOTE — Telephone Encounter (Signed)
As discussed last visit was referred to lung specialist due to chronic cough. She had indicated that her symptoms would not resolve despite abx. That was reason for the visit.

## 2010-11-15 NOTE — Telephone Encounter (Signed)
Left message for pt to call back  °

## 2010-11-18 NOTE — Telephone Encounter (Signed)
Pt notes that after MD discontinued lisinopril she has been feeling a lot better and the cough has subsided significantly. She states that she can sleep at night with very little coughing. Pt also notes that she can not afford to go to a lot different doctors. She will cancel the appointment with the lung doctor

## 2010-12-09 ENCOUNTER — Ambulatory Visit: Payer: Medicare Other | Admitting: Adult Health

## 2010-12-24 ENCOUNTER — Encounter: Payer: Self-pay | Admitting: Internal Medicine

## 2010-12-26 ENCOUNTER — Ambulatory Visit: Payer: Medicare Other | Admitting: Internal Medicine

## 2010-12-26 ENCOUNTER — Encounter: Payer: Self-pay | Admitting: Internal Medicine

## 2010-12-26 ENCOUNTER — Ambulatory Visit (INDEPENDENT_AMBULATORY_CARE_PROVIDER_SITE_OTHER): Payer: Medicare Other | Admitting: Internal Medicine

## 2010-12-26 DIAGNOSIS — Z79899 Other long term (current) drug therapy: Secondary | ICD-10-CM

## 2010-12-26 DIAGNOSIS — R413 Other amnesia: Secondary | ICD-10-CM

## 2010-12-26 DIAGNOSIS — M7989 Other specified soft tissue disorders: Secondary | ICD-10-CM

## 2010-12-26 LAB — BASIC METABOLIC PANEL
CO2: 24 mEq/L (ref 19–32)
Calcium: 9.3 mg/dL (ref 8.4–10.5)
Chloride: 106 mEq/L (ref 96–112)
Glucose, Bld: 90 mg/dL (ref 70–99)
Potassium: 4.1 mEq/L (ref 3.5–5.3)
Sodium: 140 mEq/L (ref 135–145)

## 2010-12-26 LAB — HM PAP SMEAR

## 2010-12-26 LAB — VITAMIN B12: Vitamin B-12: 371 pg/mL (ref 211–911)

## 2010-12-26 MED ORDER — CEPHALEXIN 500 MG PO CAPS: 500.0000 mg | ORAL_CAPSULE | Freq: Three times a day (TID) | ORAL | Status: AC

## 2010-12-26 MED ORDER — HYDROCHLOROTHIAZIDE 12.5 MG PO CAPS: 25.0000 mg | ORAL_CAPSULE | Freq: Every day | ORAL | Status: DC

## 2010-12-26 MED ORDER — LORAZEPAM 1 MG PO TABS: 1.0000 mg | ORAL_TABLET | Freq: Every evening | ORAL | Status: DC | PRN

## 2010-12-29 DIAGNOSIS — M7989 Other specified soft tissue disorders: Secondary | ICD-10-CM | POA: Insufficient documentation

## 2010-12-29 NOTE — Progress Notes (Signed)
  Subjective:    Patient ID: [name], female    DOB: [birth year], 77 y.o.   MRN: [id number]  HPI Patient presents to clinic for evaluation of foot swelling. Complains of bilateral lower extremity foot swelling no injury trauma or shortness of breath. Does have history of hypothyroidism and also takes very low dose diuretic therapy. Swelling worse with dependent position. No other exacerbating or alleviating factors. Blood pressure reviewed as normal. No other complaints  Reviewed past medical history, medications and allergies  Review of Systems see history of present illness     Objective:   Physical Exam  Physical Exam  Vitals reviewed. Constitutional:  appears well-developed and well-nourished. No distress.  HENT:  Head: Normocephalic and atraumatic.  Nose: Nose normal.  Mouth/Throat: Oropharynx is clear and moist. No oropharyngeal exudate.  Eyes: Conjunctivae clear Right eye exhibits no discharge. Left eye exhibits no discharge. No scleral icterus.  Neck: Neck supple. No thyromegaly present.  Cardiovascular: Normal rate, regular rhythm and normal heart sounds.  Exam reveals no gallop and no friction rub.   No murmur heard. Pulmonary/Chest: Effort normal and breath sounds normal. No respiratory distress.  has no wheezes.  has no rales.  Lymphadenopathy:   no cervical adenopathy.  Neurological:  is alert.  Skin: Skin is warm and dry.  not diaphoretic.  Psychiatric: normal mood and affect.  Extremities:  mild trace to +1 lower extremity swelling      Assessment & Plan:   No problem-specific assessment & plan notes found for this encounter.

## 2010-12-29 NOTE — Assessment & Plan Note (Signed)
Increase HCTZ. Obtain labs.

## 2010-12-31 ENCOUNTER — Telehealth: Payer: Self-pay | Admitting: *Deleted

## 2010-12-31 MED ORDER — MAGNESIUM OXIDE 400 MG PO TABS: ORAL_TABLET | ORAL | Status: DC

## 2010-12-31 NOTE — Telephone Encounter (Signed)
Patient called stating she is having sever leg cramps on a nightly basis. She states the cramping she is having is severe and causes the muscles in her legs to ball up in knots. She is wanting to know if Dr Rodena Medin would prescribe something for her.

## 2010-12-31 NOTE — Telephone Encounter (Signed)
Get her to start mag oxide 1-2 daily. Can decrease cramps.

## 2010-12-31 NOTE — Telephone Encounter (Signed)
Call placed to patient at [phone], she was advised to take magnesium Oxide per Dr Rodena Medin instruction. Patient advised to call back if there is no improvement in leg cramps. Patient has verbalized understanding and agrees as instructed.

## 2011-02-24 ENCOUNTER — Observation Stay (HOSPITAL_COMMUNITY)
Admission: EM | Admit: 2011-02-24 | Discharge: 2011-02-27 | Disposition: A | Discharge status: Home / Self Care | Payer: Medicare Other | Attending: Internal Medicine | Admitting: Internal Medicine

## 2011-02-24 ENCOUNTER — Emergency Department (HOSPITAL_COMMUNITY): Payer: Medicare Other

## 2011-02-24 DIAGNOSIS — R0989 Other specified symptoms and signs involving the circulatory and respiratory systems: Secondary | ICD-10-CM | POA: Insufficient documentation

## 2011-02-24 DIAGNOSIS — R079 Chest pain, unspecified: Principal | ICD-10-CM | POA: Insufficient documentation

## 2011-02-24 DIAGNOSIS — Z8612 Personal history of poliomyelitis: Secondary | ICD-10-CM | POA: Insufficient documentation

## 2011-02-24 DIAGNOSIS — I10 Essential (primary) hypertension: Secondary | ICD-10-CM | POA: Insufficient documentation

## 2011-02-24 DIAGNOSIS — M7989 Other specified soft tissue disorders: Secondary | ICD-10-CM | POA: Insufficient documentation

## 2011-02-24 DIAGNOSIS — M25579 Pain in unspecified ankle and joints of unspecified foot: Secondary | ICD-10-CM | POA: Insufficient documentation

## 2011-02-24 DIAGNOSIS — E039 Hypothyroidism, unspecified: Secondary | ICD-10-CM | POA: Insufficient documentation

## 2011-02-24 DIAGNOSIS — R0609 Other forms of dyspnea: Secondary | ICD-10-CM | POA: Insufficient documentation

## 2011-02-24 DIAGNOSIS — Z79899 Other long term (current) drug therapy: Secondary | ICD-10-CM | POA: Insufficient documentation

## 2011-02-24 LAB — COMPREHENSIVE METABOLIC PANEL
ALT: 14
AST: 17
Albumin: 3.8
Alkaline Phosphatase: 85
BUN: 10
CO2: 27
Calcium: 9.6
Chloride: 105
Creatinine, Ser: 0.82
GFR calc Af Amer: >60
GFR calc non Af Amer: >60
Glucose, Bld: 96
Potassium: 3.9
Sodium: 140
Total Bilirubin: 0.8
Total Protein: 6.8

## 2011-02-24 LAB — CARDIAC PANEL(CRET KIN+CKTOT+MB+TROPI)
CK, MB: 1.7
Relative Index: 1.5
Total CK: 117
Troponin I: 0.02

## 2011-02-24 LAB — URINALYSIS, ROUTINE W REFLEX MICROSCOPIC
Bilirubin Urine: NEGATIVE
Ketones, ur: NEGATIVE
Nitrite: NEGATIVE
Protein, ur: 30 — AB
Urobilinogen, UA: 0.2

## 2011-02-24 LAB — DIFFERENTIAL
Basophils Absolute: 0
Basophils Relative: 0
Lymphocytes Relative: 31
Neutro Abs: 6
Neutrophils Relative %: 64

## 2011-02-24 LAB — PROTIME-INR: INR: 0.9

## 2011-02-24 LAB — POCT I-STAT TROPONIN I: Troponin i, poc: 0 ng/mL (ref 0.00–0.08)

## 2011-02-24 LAB — CBC
HCT: 37.6
Hemoglobin: 12.9
MCV: 85.1
RDW: 12.7

## 2011-02-24 LAB — POCT I-STAT, CHEM 8
Creatinine, Ser: 1 mg/dL (ref 0.50–1.10)
Hemoglobin: 12.2 g/dL (ref 12.0–15.0)
Sodium: 142 mEq/L (ref 135–145)
TCO2: 26 mmol/L (ref 0–100)

## 2011-02-24 LAB — APTT: aPTT: 31

## 2011-02-25 ENCOUNTER — Emergency Department (HOSPITAL_COMMUNITY): Payer: Medicare Other

## 2011-02-25 ENCOUNTER — Encounter (HOSPITAL_COMMUNITY): Payer: Self-pay

## 2011-02-25 DIAGNOSIS — M79609 Pain in unspecified limb: Secondary | ICD-10-CM

## 2011-02-25 DIAGNOSIS — M7989 Other specified soft tissue disorders: Secondary | ICD-10-CM

## 2011-02-25 LAB — CARDIAC PANEL(CRET KIN+CKTOT+MB+TROPI)
CK, MB: 1.7 ng/mL (ref 0.3–4.0)
Relative Index: INVALID (ref 0.0–2.5)
Relative Index: INVALID (ref 0.0–2.5)
Total CK: 71 U/L (ref 7–177)
Troponin I: <0.3 ng/mL (ref <0.30)
Troponin I: <0.3 ng/mL (ref <0.30)

## 2011-02-25 LAB — CBC
MCH: 29.1 pg (ref 26.0–34.0)
MCV: 85.6 fL (ref 78.0–100.0)
Platelets: 249 10*3/uL (ref 150–400)
RDW: 12.5 % (ref 11.5–15.5)
WBC: 8.7 10*3/uL (ref 4.0–10.5)

## 2011-02-25 LAB — BASIC METABOLIC PANEL
CO2: 27 mEq/L (ref 19–32)
Calcium: 9.2 mg/dL (ref 8.4–10.5)
Chloride: 104 mEq/L (ref 96–112)
Creatinine, Ser: 0.72 mg/dL (ref 0.50–1.10)
GFR calc Af Amer: >60 mL/min (ref >60)
Sodium: 139 mEq/L (ref 135–145)

## 2011-02-25 LAB — CK TOTAL AND CKMB (NOT AT ARMC)
CK, MB: 2.6 ng/mL (ref 0.3–4.0)
Total CK: 93 U/L (ref 7–177)
Total CK: 85 U/L (ref 7–177)

## 2011-02-25 LAB — LIPID PANEL
Cholesterol: 226 mg/dL — ABNORMAL HIGH (ref 0–200)
Total CHOL/HDL Ratio: 5.5 RATIO
VLDL: 34 mg/dL (ref 0–40)

## 2011-02-25 LAB — DIFFERENTIAL
Eosinophils Absolute: 0.1 10*3/uL (ref 0.0–0.7)
Eosinophils Relative: 1 % (ref 0–5)
Lymphs Abs: 2.5 10*3/uL (ref 0.7–4.0)

## 2011-02-25 LAB — HEMOGLOBIN A1C
Hgb A1c MFr Bld: 6.1 % — ABNORMAL HIGH (ref <5.7)
Mean Plasma Glucose: 128 mg/dL — ABNORMAL HIGH (ref <117)

## 2011-02-25 MED ORDER — IOHEXOL 300 MG/ML  SOLN
80.0000 mL | Freq: Once | INTRAMUSCULAR | Status: AC | PRN
  Administered 2011-02-25: 80 mL via INTRAVENOUS

## 2011-02-26 ENCOUNTER — Observation Stay (HOSPITAL_COMMUNITY): Payer: Medicare Other

## 2011-03-03 ENCOUNTER — Ambulatory Visit (INDEPENDENT_AMBULATORY_CARE_PROVIDER_SITE_OTHER): Payer: Medicare Other | Admitting: Internal Medicine

## 2011-03-03 ENCOUNTER — Encounter: Payer: Self-pay | Admitting: Internal Medicine

## 2011-03-03 DIAGNOSIS — R0609 Other forms of dyspnea: Secondary | ICD-10-CM

## 2011-03-03 MED ORDER — DOXYCYCLINE HYCLATE 100 MG PO TABS: 100.0000 mg | ORAL_TABLET | Freq: Two times a day (BID) | ORAL | Status: AC

## 2011-03-03 MED ORDER — LEVALBUTEROL TARTRATE 45 MCG/ACT IN AERO: 2.0000 | INHALATION_SPRAY | RESPIRATORY_TRACT | Status: DC | PRN

## 2011-03-03 NOTE — Assessment & Plan Note (Signed)
Retrieved and reviewed recent echo performed demonstrating MR with nl EF. Chest ct/cxr and lab evaluation unrevealing. Recommend course of abx tx and resume xopenex prn. Followup if no improvement or worsening.

## 2011-03-03 NOTE — Progress Notes (Signed)
  Subjective:    Patient ID: [name], female    DOB: Feb 14, 1921, 77 y.o.   MRN: [id number]  HPI Pt presents to clinic for hospital follow up of chest pain. Had atypical cp with lab evaluation, cxr, ekg, LE [place] and chest ct unrevealing. Cp resolved. Since hospital dc notes cough intermittently productive for yellow sputum without hemoptysis. Also notes mild DOE without obvious wheezing that has been occuring intermittently for a few weeks. Was using xopenex mdi prn but stopped. No adverse effect of medication. Declines flu shot. No other complaints.  Past Medical History  Diagnosis Date  . Arthritis   . Chicken pox   . Chronic kidney disease   . Colon polyps    Past Surgical History  Procedure Date  . Appendectomy   . Cholecystectomy   . Kidney stones   . Tonsillectomy   . Abdominal hysterectomy   . Hand surgery   . Feet surgery   . Shoulder arthroscopy     reports that she quit smoking about 69 years ago. Her smoking use included Cigarettes. She has a .2 pack-year smoking history. She has never used smokeless tobacco. She reports that she does not drink alcohol or use illicit drugs. family history includes Birth defects in her other and Heart disease in her father, mother, and other. Allergies  Allergen Reactions  . Codeine        Review of Systems see hpi     Objective:   Physical Exam  Physical Exam  Nursing note and vitals reviewed. Constitutional: Appears well-developed and well-nourished. No distress.  HENT:  Head: Normocephalic and atraumatic.  Right Ear: External ear normal.  Left Ear: External ear normal.  Eyes: Conjunctivae are normal. No scleral icterus.  Neck: Neck supple. Carotid bruit is not present.  Cardiovascular: Normal rate, regular rhythm and normal heart sounds.  Exam reveals no gallop and no friction rub.   No murmur heard. Pulmonary/Chest: Effort normal and breath sounds normal. No respiratory distress. He has no wheezes. no rales.    Lymphadenopathy:    He has no cervical adenopathy.  Neurological:Alert.  Skin: Skin is warm and dry. Not diaphoretic.  Psychiatric: Has a normal mood and affect.        Assessment & Plan:

## 2011-03-09 NOTE — Discharge Summary (Addendum)
NAMESAYDI, [name]                 ACCOUNT NO.:  [id number]  MEDICAL RECORD NO.:  [id number]  LOCATION:  1403                         FACILITY:  [facility]  PHYSICIAN:  Hollice Espy, M.D.DATE OF BIRTH:  Jan 05, 1921  DATE OF ADMISSION:  02/24/2011 DATE OF DISCHARGE:  02/27/2011                        DISCHARGE SUMMARY - REFERRING   PRIMARY CARE PROVIDER:  Charlynn Court, MD  DISCHARGE DIAGNOSES: 1. Chest pain. 2. Left lower extremity edema/tenderness. 3. Hypothyroidism. 4. Hypertension. 5. Left ankle pain.  DISCHARGE MEDICATIONS: 1. Hydrocodone/APAP 5-325 mg 1-2 tablets p.o. every 4 hours as needed     for pain. 2. Hydrochlorothiazide 12.5 mg 1 tablet b.i.d. 3. Levothyroxine 50 mcg 1 tablet p.o. daily. 4. Mag-Ox 400 one to two tablets p.o. daily.  DIAGNOSTIC LABORATORY DATA:  TSH is 3.093.  Sodium is 142, potassium 4.3, chloride 105, CO2 of 26, BUN 13, creatinine 1.00.  Hemoglobin 12.2, hematocrit 36, WBCs 8.7, platelets 249.  First set of cardiac enzymes yielded a total CK of 93, CK-MB 2.6, troponin 1 less than 0.30.  Second set yielded a total CK 85, CK-MB 2.0, troponin 1 less than 0.30.  Third set yielded a total CK 81, CK-MB 2.4, troponin 1 less than 0.30. Last set yields a total CK of 71, CK-MB 1.7, troponin 1 less than 0.30. Hemoglobin A1c is 6.1.  Lipid profile yields cholesterol 226, triglycerides 168, HDL 41, LDL 151.  DIAGNOSTIC IMAGING: 1. Chest x-ray done on September 24th yields no active disease.  No     significant change. 2. CT angio of the chest done on September 25th yields negative for     pulmonary embolism.  No acute findings in the chest.  Mild     cardiomegaly.  Atherosclerotic calcification of the coronary     arteries and thoracic aorta. 3. X-ray of the left ankle shows no acute bony abnormality. 4. Left lower venous Doppler performed on September 25th yields no     DVT. 5. A 2-D echo done on September 26th, results pending at the time of  this discharge.  CONSULTS:  None.  PROCEDURES:  None.  BRIEF HISTORY:  [name] is an 77 year old female with a past medical history of hypertension, hypothyroidism, dyslipidemia who presented to the [facility] ED on September 25th with a chief complaint of left- sided chest pain.  She indicated that she had been in her usual state of health and after working in the yard for a few minutes and walking back up to the house, she sat down in a chair.  At that time, she developed sharp chest pain on the left side.  She indicated it only lasted a few minutes, but that has reoccurred throughout the day.  She described the pain sharp without radiation, being about 7/10.  She denied nausea, vomiting, diaphoresis.  No palpitations.  She was brought to the emergency room for further evaluation and at that time she reported feeling sore on her left leg for the past couple of weeks.  She indicated that upon walking up hill, she gets slightly short of breath, and this has been going on for the last 6 months.  Hospitalists wereasked to admit for further  evaluation and treatment.  HOSPITAL COURSE BY PROBLEM: 1. Chest pain/shortness of breath.  Acute coronary syndrome ruled out.     The patient was admitted to Telemetry.  Cardiac enzymes were cycled     as dictated above were negative.  She was monitored on telemetry,     remained in sinus rhythm during her hospitalization.  EKG noted     sinus bradycardia with sinus arrhythmia and first-degree AV block.     She also had a 2-D echo done whose results are pending at this     time.  Her symptoms resolved quickly.  During her hospitalization,     she experienced no further chest pain or shortness of breath, but     did complain of some weakness.  The patient is being discharged to     home with instructions to keep activity to a minimum.  She has an     appointment with her primary care provider on October 1st at 10     a.m. 2. Left leg  swelling/tenderness.  There was some concern for DVT.     Left lower extremity Doppler done as dictated above was negative     for DVT.  The patient was maintained on Lovenox DVT prophylaxis     while in the hospital.  She ambulated in the hall without     assistance, without problem.  She had a steady gait. 3. Hypothyroidism.  TSH as above.  The patient was maintained on her     Synthroid. 4. Hypertension.  Blood pressure was controlled during her     hospitalization.  She is continued on her HCTZ. 5. Left ankle pain.  Question of sprain since 4 days ago.  X-ray of     her left ankle as dictated above.  The patient provided some pain     medicine.  She ambulated with PT/OT without assistance with a     steady gait.  PHYSICAL EXAMINATION:  VITAL SIGNS:  Temperature 97.9, blood pressure 122/71, heart rate 59, respirations 18, saturations 97% on room air. GENERAL:  Awake, alert, sitting up in chair, no acute distress. CV:  Regular rate and rhythm.  No murmur, gallop, or rub.  Trace lower extremity edema. RESPIRATORY:  Normal effort.  Breath sounds clear to auscultation bilaterally.  No rhonchi, wheezes, or rales. ABDOMEN:  Soft, positive bowel sounds throughout, nontender to palpation. NEURO:  Alert and oriented x3.  Speech clear.  Facial symmetry.  Moves all extremities.  ACTIVITY:  Increase activity slowly.  DIET:  Heart-healthy, low sodium.  FOLLOWUP: 1. The patient has an appointment with Dr. Rodena Medin of [facility] on     October 1st at 10:30 a.m. 2. Results of patient's 2-D echo are pending at the time of this     dictation.  Once I get the results before the end of this day, I     will call them to her.  She has had no chest pain, no shortness of     breath.  Her left leg swelling has improved.  She has an     appointment in 3 days with her primary care provider.  Based on the     results of the workup and her clinical presentation, she is being     discharged to home and  further workup indication to be determined     by her primary care provider.  DISPOSITION:  The patient is medically stable and ready for discharge.  Time spent on  this discharge 40 minutes.     Gwenyth Bender, NP   ______________________________ Hollice Espy, M.D.    KMB/MEDQ  D:  02/27/2011  T:  02/27/2011  Job:  [id number]  cc:   Charlynn Court, MD  Electronically Signed by Toya Smothers  on 03/09/2011 08:26:12 AM Electronically Signed by Virginia Rochester M.D. on 03/09/2011 05:32:46 PM

## 2011-03-13 NOTE — H&P (Signed)
[name], [name] NO.:  [id number]  MEDICAL RECORD NO.:  [id number]  LOCATION:  WLED                         FACILITY:  [facility]  PHYSICIAN:  Jeoffrey Massed, MD    DATE OF BIRTH:  1920/06/25  DATE OF ADMISSION:  02/24/2011 DATE OF DISCHARGE:                             HISTORY & PHYSICAL   PRIMARY CARE PRACTITIONER:  Charlynn Court, MD  PRIMARY CARDIOLOGIST:  Landry Corporal, MD, [facility].  CHIEF COMPLAINT:  Left-sided chest pain.  HISTORY OF PRESENT ILLNESS:  The patient is an 77 year old Caucasian female with past medical history of hypertension, hypothyroidism, dyslipidemia, who presents to the hospital with the above-noted complaints.  Per patient, she was in her usual state of health and all of a sudden she started having sharp chest pain to the left side of the chest.  This only occurred for few a minutes, but this was recurrent throughout the day.  She describes the pain is sharp without any radiation on a scale of 1 to 10 with it being around 7/10.  There was no nausea or vomiting.  There was no diaphoresis.  There were also no palpitations.  Patient was then brought to the ED for further evaluation.  Upon further questioning, patient claims that she feels sore on her left leg for the past couple of weeks as well.  She also claims that upon walking uphill she gets slightly short of breath and this has been going on for around 6 months or so.  The hospitalist service is now being asked to admit this patient for further evaluation and treatment.  ALLERGIES:  The patient is allergic to CODEINE.  PAST MEDICAL HISTORY: 1. Hypertension. 2. Hypothyroidism. 3. History of dyslipidemia.  PAST SURGICAL HISTORY: 1. Numerous back surgeries. 2. Hysterectomy. 3. Kidney surgeries.  MEDICATIONS AT HOME:  Include: 1. Levothyroxine 50 mcg p.o. daily. 2. Hydrochlorothiazide 12.5 mg p.o. daily. 3. Ativan 1 mg p.o.  q.h.s..  FAMILY HISTORY:  Her mother had numerous heart problems that she does not know the details of.  SOCIAL HISTORY:  Lives alone.  Denies any toxic habits.  REVIEW OF SYSTEMS:  A detailed review of 12 systems was done and these are negative except for the ones noted in the HPI.  PHYSICAL EXAMINATION:  GENERAL EXAM:  Very pleasant elderly female lying in bed, does not appear to be in any distress.  Awake, alert.  Speech is clear. VITAL SIGNS:  Temperature 98.3, heart rate of 66, blood pressure of 169/74, respiration of 20, and a pulse oximetry of 98% on room air. HEENT:  Atraumatic, normocephalic.  Pupils are equally reactive to light and accommodation. NECK:  Supple. CHEST:  Bilaterally clear to auscultation. CARDIOVASCULAR:  Heart sounds are regular with no murmurs heard. ABDOMEN:  Soft, nontender, nondistended. EXTREMITIES:  Free of edema. NEUROLOGIC:  The patient is awake and alert does not appear to be any focal neurological deficits.  Cranial nerves II through XII are intact.  LABORATORY DATA.: 1. An i-STAT Chem-8 shows a sodium of 142, potassium of 4.3, chloride     of 105, glucose of 101, BUN of 13, and creatinine of 1.0. 2.  I-STAT also shows a hemoglobin of 12.2 and hematocrit of 36.0. 3. First set of troponin is negative.  RADIOLOGICAL STUDIES.: 1. CT angiogram of the chest shows to be negative for pulmonary     embolism.  No acute findings in the chest.  Mild cardiomegaly.     Atherosclerotic calcification of the coronary arteries and thoracic     aorta. 2. EKG shows sinus rhythm with nonspecific ST changes.  ASSESSMENT: 1. Chest pain that is actually reproducible on palpation. 2. History of hypertension, currently uncontrolled. 3. History of dyslipidemia. 4. History of hypothyroidism.  PLAN: 1. The patient will be admitted to Telemetry Unit. 2. Cardiac enzymes will be cycled. 3. A 2D echocardiogram will be obtained. 4. Patient will be continued on  levothyroxine and hydrochlorothiazide. 5. Further plan will depend as the patient's clinical course evolves. 6. DVT prophylaxis with Lovenox.  Total time spent for admission, 45 minutes.     Jeoffrey Massed, MD     SG/MEDQ  D:  02/25/2011  T:  02/25/2011  Job:  [id number]  cc:   Landry Corporal, MD Fax: [phone]  Charlynn Court, MD  Electronically Signed by Jeoffrey Massed  on 03/13/2011 04:35:26 PM

## 2011-03-14 LAB — URINALYSIS, ROUTINE W REFLEX MICROSCOPIC
Ketones, ur: NEGATIVE
Nitrite: NEGATIVE
Protein, ur: 100 — AB
pH: 5

## 2011-03-14 LAB — CBC
MCHC: 34.5
MCV: 86.1
Platelets: 307
RDW: 13

## 2011-03-14 LAB — COMPREHENSIVE METABOLIC PANEL
AST: 19
CO2: 29
Calcium: 9.6
Creatinine, Ser: 0.79
GFR calc Af Amer: >60
GFR calc non Af Amer: >60
Sodium: 141
Total Protein: 6.6

## 2011-03-14 LAB — URINE MICROSCOPIC-ADD ON

## 2011-03-14 LAB — APTT: aPTT: 30

## 2011-03-28 ENCOUNTER — Telehealth: Payer: Self-pay | Admitting: Internal Medicine

## 2011-03-28 ENCOUNTER — Encounter: Payer: Self-pay | Admitting: Internal Medicine

## 2011-03-28 ENCOUNTER — Ambulatory Visit (INDEPENDENT_AMBULATORY_CARE_PROVIDER_SITE_OTHER): Payer: Medicare Other | Admitting: Internal Medicine

## 2011-03-28 DIAGNOSIS — E039 Hypothyroidism, unspecified: Secondary | ICD-10-CM

## 2011-03-28 DIAGNOSIS — M79606 Pain in leg, unspecified: Secondary | ICD-10-CM

## 2011-03-28 DIAGNOSIS — M79609 Pain in unspecified limb: Secondary | ICD-10-CM

## 2011-03-28 DIAGNOSIS — Z79899 Other long term (current) drug therapy: Secondary | ICD-10-CM

## 2011-03-28 DIAGNOSIS — I10 Essential (primary) hypertension: Secondary | ICD-10-CM

## 2011-03-28 NOTE — Assessment & Plan Note (Signed)
Stable. Obtain tsh/ft4 prior to next visit

## 2011-03-28 NOTE — Assessment & Plan Note (Signed)
Normotensive and stable. Continue current regimen. Monitor bp as outpt and followup in clinic as scheduled. Obtain chem7 prior to next visit 

## 2011-03-28 NOTE — Telephone Encounter (Signed)
Lab orders entered for [place] for February 2013. 

## 2011-03-28 NOTE — Patient Instructions (Signed)
Obtain chem7 (v58.69) and tsh/free t4 (hypothyroidism) prior to next visit

## 2011-03-28 NOTE — Progress Notes (Signed)
  Subjective:    Patient ID: [name], female    DOB: 07-22-1920, 77 y.o.   MRN: [id number]  HPI Pt presents to clinic for followup of multiple medical problems. Notes recent left lower lateral leg pain which is tender. No injury or trauma but has been working in the yard recently. Reviewed recent le [place] neg for DVT during hospital stay. H/o sciatica but denies radicular pain or parethesia. Heating pad helps at night. H/o HTN tolerating diuretic therapy without side effect. bp reviewed normotensive. No other complaints.  Past Medical History  Diagnosis Date  . Arthritis   . Chicken pox   . Chronic kidney disease   . Colon polyps    Past Surgical History  Procedure Date  . Appendectomy   . Cholecystectomy   . Kidney stones   . Tonsillectomy   . Abdominal hysterectomy   . Hand surgery   . Feet surgery   . Shoulder arthroscopy     reports that she quit smoking about 69 years ago. Her smoking use included Cigarettes. She has a .2 pack-year smoking history. She has never used smokeless tobacco. She reports that she does not drink alcohol or use illicit drugs. family history includes Birth defects in her other and Heart disease in her father, mother, and other. Allergies  Allergen Reactions  . Codeine      Review of Systems see hpi      Objective:   Physical Exam  Physical Exam  Nursing note and vitals reviewed. Constitutional: Appears well-developed and well-nourished. No distress.  HENT:  Head: Normocephalic and atraumatic.  Right Ear: External ear normal.  Left Ear: External ear normal.  Eyes: Conjunctivae are normal. No scleral icterus.  Neck: Neck supple. Carotid bruit is not present.  Cardiovascular: Normal rate, regular rhythm and normal heart sounds.  Exam reveals no gallop and no friction rub.   No murmur heard. Pulmonary/Chest: Effort normal and breath sounds normal. No respiratory distress. He has no wheezes. no rales.  Lymphadenopathy:    He has no cervical  adenopathy.  Neurological:Alert.  Skin: Skin is warm and dry. Not diaphoretic.  Psychiatric: Has a normal mood and affect.   MSK: left lower lateral leg along muscle border + mild tenderness. No mass. Able to wt bear and ambulate without difficulty. No calf swelling or palpable cords      Assessment & Plan:

## 2011-03-28 NOTE — Assessment & Plan Note (Signed)
Appears msk etiology. Use topical heat prn. Followup if no improvement or worsening.

## 2011-05-01 ENCOUNTER — Encounter: Payer: Self-pay | Admitting: Internal Medicine

## 2011-05-01 ENCOUNTER — Ambulatory Visit (INDEPENDENT_AMBULATORY_CARE_PROVIDER_SITE_OTHER): Payer: Medicare Other | Admitting: Internal Medicine

## 2011-05-01 VITALS — BP 132/70 | HR 79 | Temp 97.9°F | Resp 20 | Wt 171.0 lb

## 2011-05-01 DIAGNOSIS — J069 Acute upper respiratory infection, unspecified: Secondary | ICD-10-CM

## 2011-05-01 MED ORDER — DOXYCYCLINE HYCLATE 100 MG PO TABS: 100.0000 mg | ORAL_TABLET | Freq: Two times a day (BID) | ORAL | Status: AC

## 2011-05-01 MED ORDER — CHLORPHENIRAMINE-HYDROCODONE 8-10 MG/5ML PO LQCR: 5.0000 mL | Freq: Two times a day (BID) | ORAL | Status: DC | PRN

## 2011-05-02 ENCOUNTER — Telehealth: Payer: Self-pay | Admitting: Internal Medicine

## 2011-05-02 MED ORDER — BENZONATATE 100 MG PO CAPS: 100.0000 mg | ORAL_CAPSULE | Freq: Three times a day (TID) | ORAL | Status: AC | PRN

## 2011-05-02 NOTE — Telephone Encounter (Signed)
Patient states that she went to fill cough medicine that Dr. Rodena Medin prescribed but insurance would not cover med and she could not afford to pay 70.00. She bought OTC med Tussin DM max and she took that last night but it has not helped. Patient would like to know if there is another cough medicine that Dr. Rodena Medin could prescribe.

## 2011-05-02 NOTE — Telephone Encounter (Signed)
Was prescribed tussionex. Can see if robutussin AC with codeine 5ml po q12 hours prn cough #55ml is cheaper. If not then tessalon perles 100mg  tid prn cough #30.

## 2011-05-02 NOTE — Telephone Encounter (Signed)
Call returned to patient  At [phone], she was advised per Dr Rodena Medin instructions. She stated he insurance will not cover the cough syrup and she brought some cough medicine over the counter that she is going to try. She stated that is that did not help, she would call back.  Rx for Jerilynn Som sent to pharmacy.

## 2011-05-03 DIAGNOSIS — J069 Acute upper respiratory infection, unspecified: Secondary | ICD-10-CM | POA: Insufficient documentation

## 2011-05-03 NOTE — Assessment & Plan Note (Signed)
Attempt tussionex prn cough. Cautioned re possible sedating efect. abx given to hold. Take to completion if sx's do not improve after total duration of 7-10 days. Followup if no improvement or worsening.

## 2011-05-03 NOTE — Progress Notes (Signed)
  Subjective:    Patient ID: [name], female    DOB: 1921-03-18, 77 y.o.   MRN: [id number]  HPI Pt presents to clinic for evaluation of cough. Notes 3d h/o cough prod for yellow sputum without hemoptysis. No f/c, wheezing or dyspnea. +sick exposure. Taking otc cough medication without significant improvement. No other complaints.  Past Medical History  Diagnosis Date  . Arthritis   . Chicken pox   . Chronic kidney disease   . Colon polyps    Past Surgical History  Procedure Date  . Appendectomy   . Cholecystectomy   . Kidney stones   . Tonsillectomy   . Abdominal hysterectomy   . Hand surgery   . Feet surgery   . Shoulder arthroscopy     reports that she quit smoking about 69 years ago. Her smoking use included Cigarettes. She has a .2 pack-year smoking history. She has never used smokeless tobacco. She reports that she does not drink alcohol or use illicit drugs. family history includes Birth defects in her other and Heart disease in her father, mother, and other. Allergies  Allergen Reactions  . Codeine       Review of Systems see hpi     Objective:   Physical Exam  Nursing note and vitals reviewed. Constitutional: She appears well-developed and well-nourished. No distress.  HENT:  Head: Normocephalic and atraumatic.  Right Ear: Tympanic membrane, external ear and ear canal normal.  Left Ear: Tympanic membrane, external ear and ear canal normal.  Nose: Nose normal.  Mouth/Throat: Oropharynx is clear and moist. No oropharyngeal exudate.  Eyes: Conjunctivae are normal. No scleral icterus.  Neck: Neck supple.  Cardiovascular: Normal rate, regular rhythm and normal heart sounds.   Pulmonary/Chest: Effort normal and breath sounds normal. No respiratory distress. She has no wheezes. She has no rales.  Neurological: She is alert.  Skin: Skin is warm and dry. She is not diaphoretic.  Psychiatric: She has a normal mood and affect.          Assessment & Plan:

## 2011-07-17 ENCOUNTER — Other Ambulatory Visit: Payer: Self-pay | Admitting: *Deleted

## 2011-07-17 DIAGNOSIS — Z79899 Other long term (current) drug therapy: Secondary | ICD-10-CM

## 2011-07-17 DIAGNOSIS — E039 Hypothyroidism, unspecified: Secondary | ICD-10-CM

## 2011-07-17 LAB — BASIC METABOLIC PANEL
BUN: 18 mg/dL (ref 6–23)
CO2: 27 mEq/L (ref 19–32)
Glucose, Bld: 109 mg/dL — ABNORMAL HIGH (ref 70–99)
Potassium: 4.8 mEq/L (ref 3.5–5.3)
Sodium: 144 mEq/L (ref 135–145)

## 2011-07-17 IMAGING — CR DG LUMBAR SPINE COMPLETE 4+V
5 series · 5 of 5 positions shown · non-contrast
Comparison: None.

CLINICAL DATA: Chronic low back pain

LUMBAR SPINE - COMPLETE 4+ VIEW

[t l-spine a.p.]
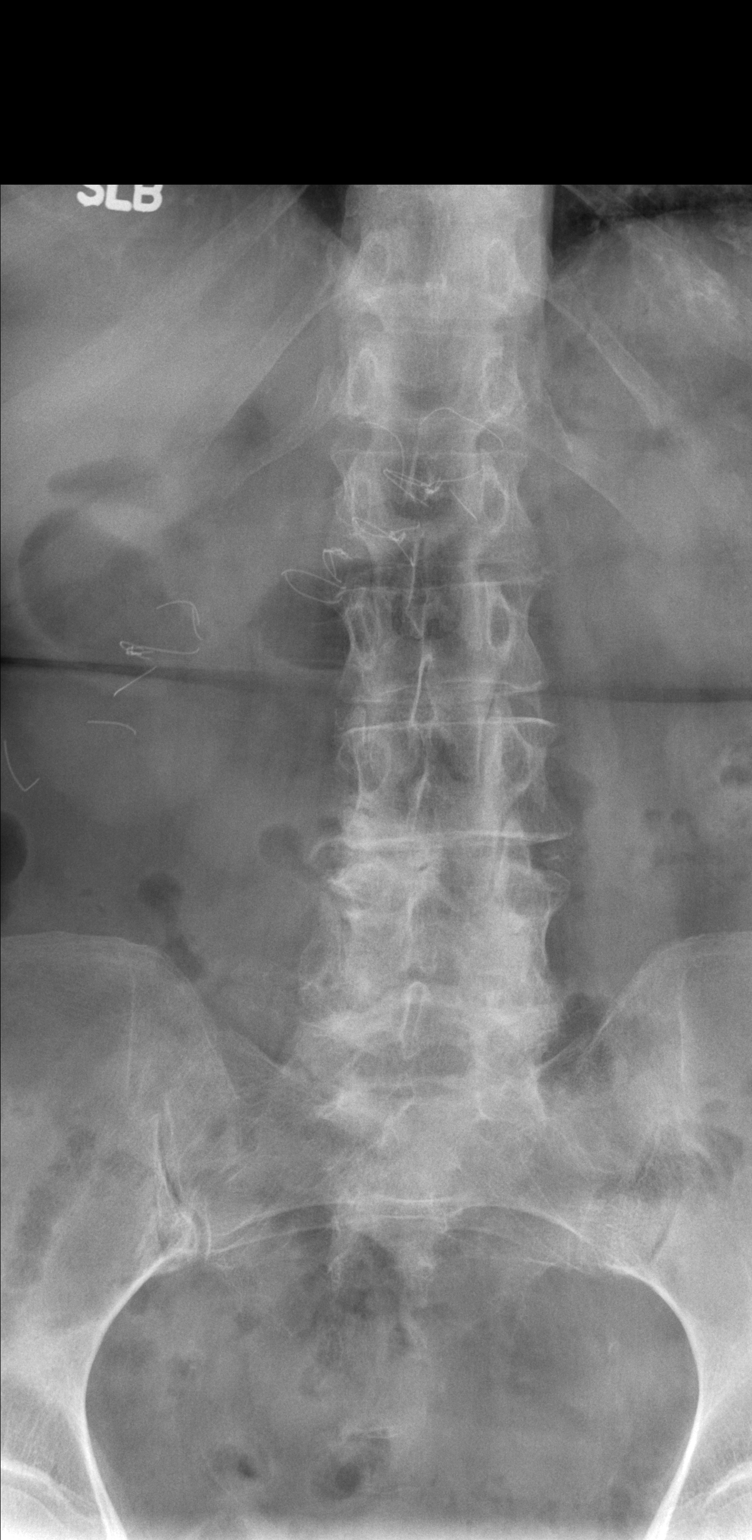

[t l-spine oblique exposure (1 of 2)]
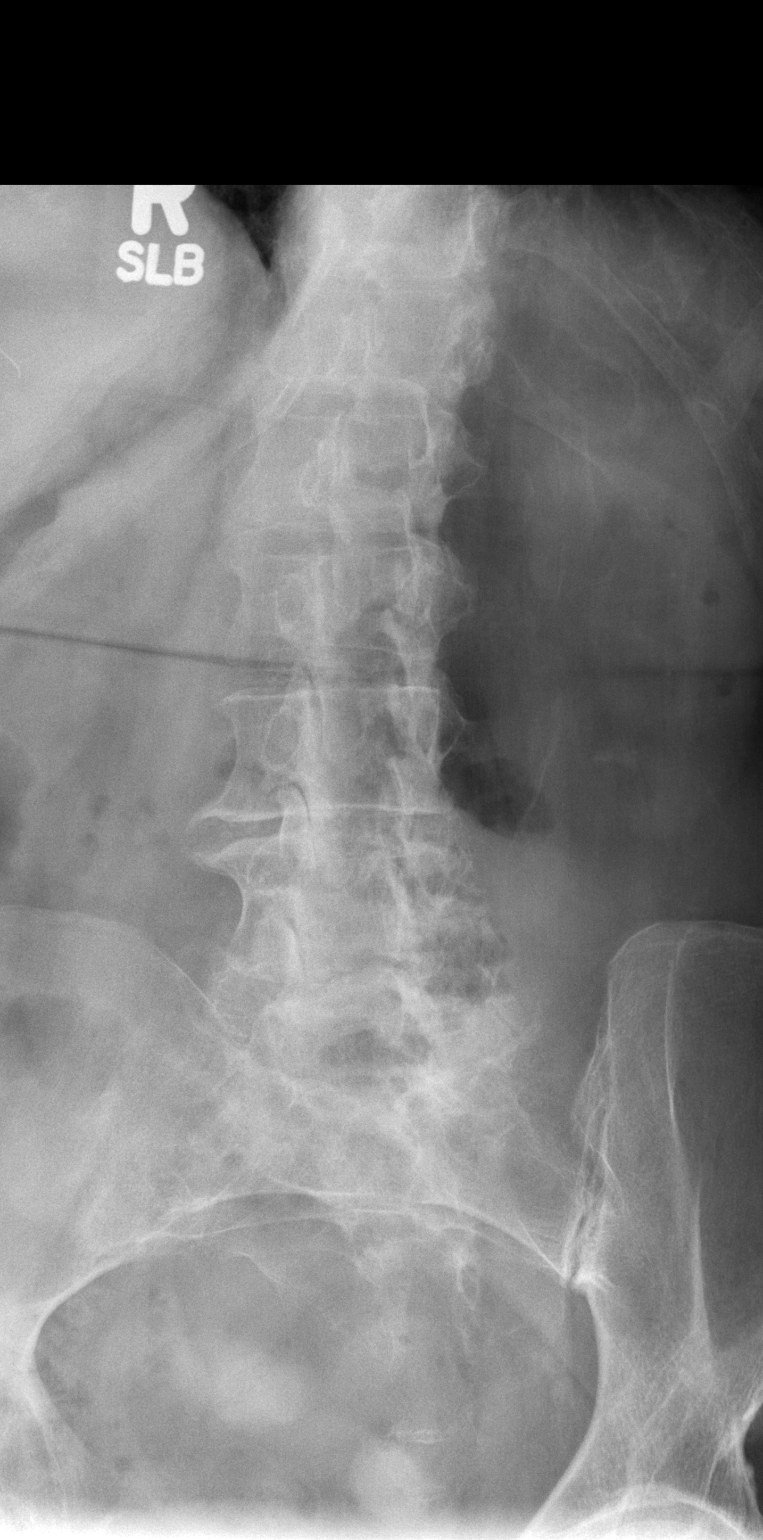

[t l-spine oblique exposure (2 of 2)]
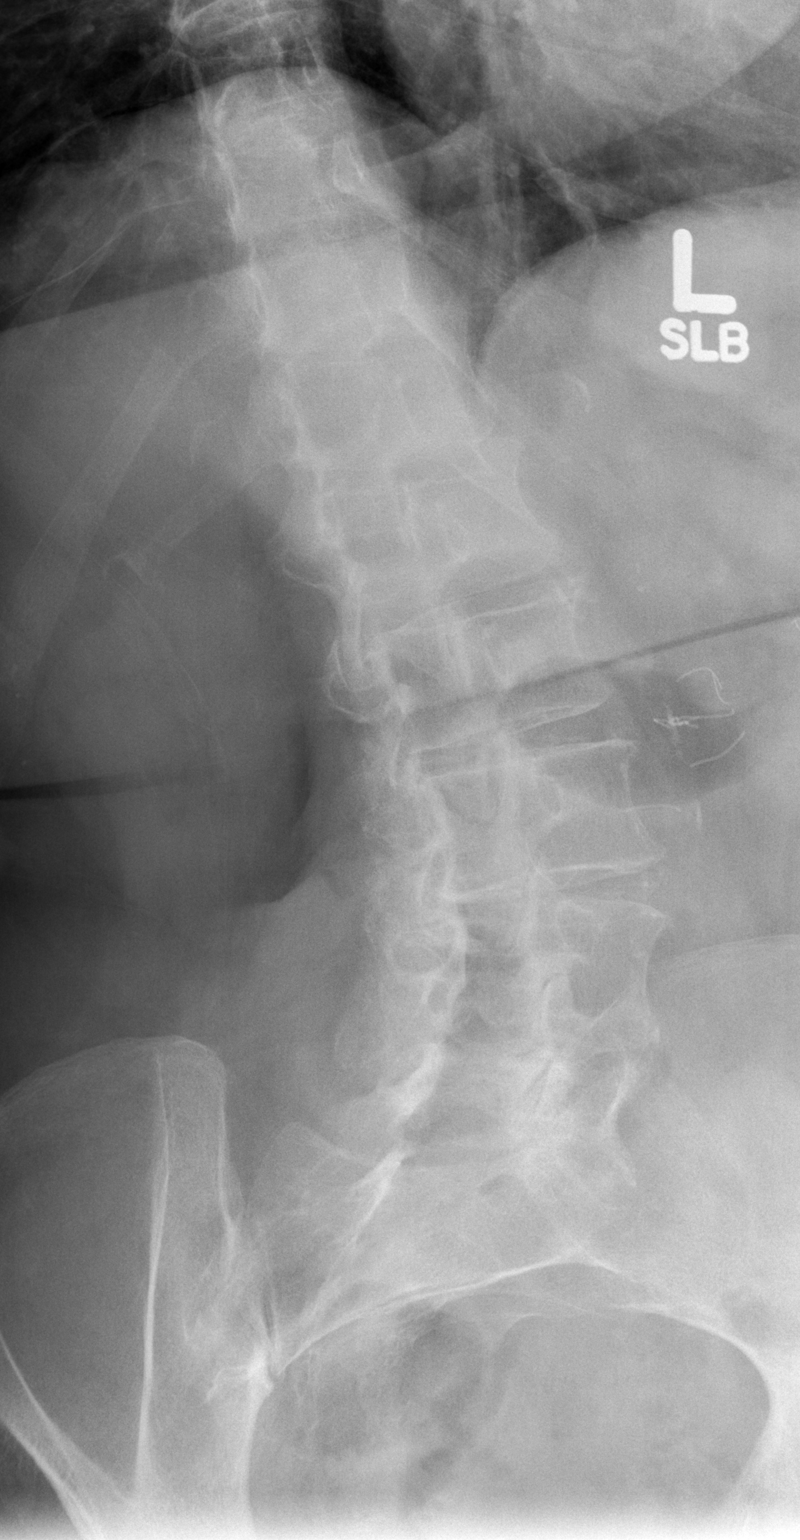

[t l-spine lat]
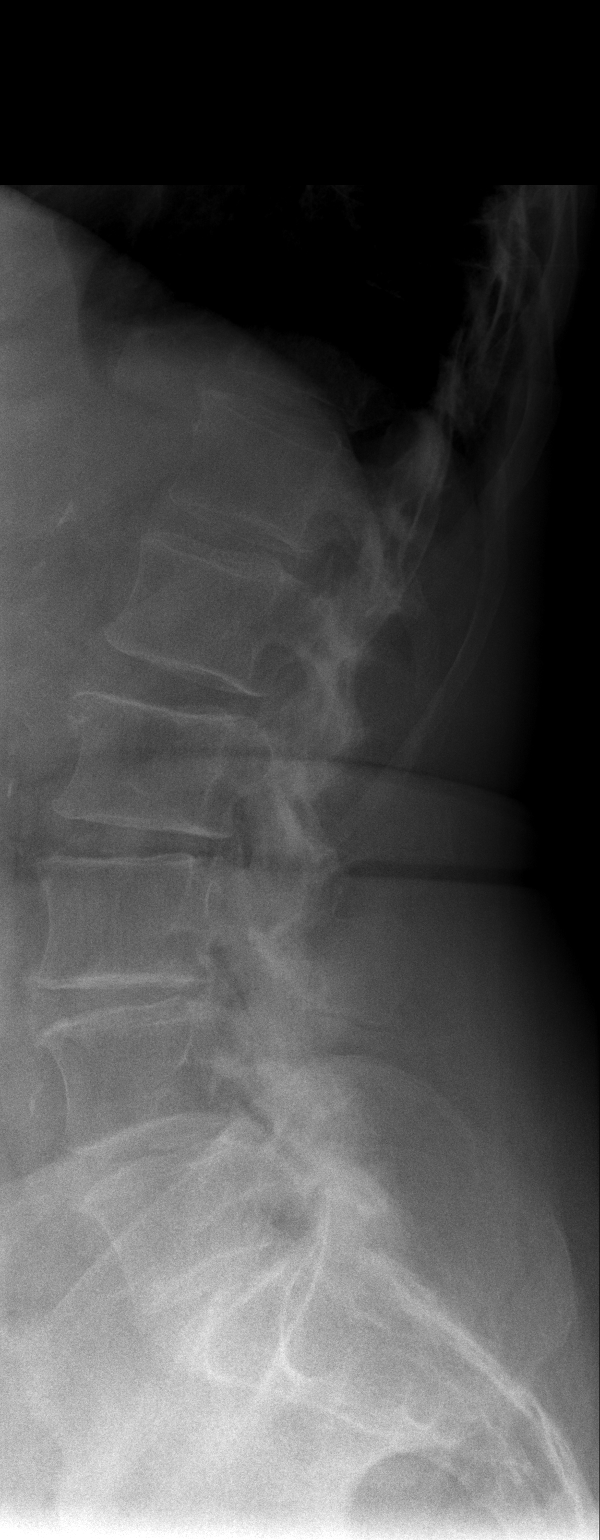

[t l-spine l5-s1 spot]
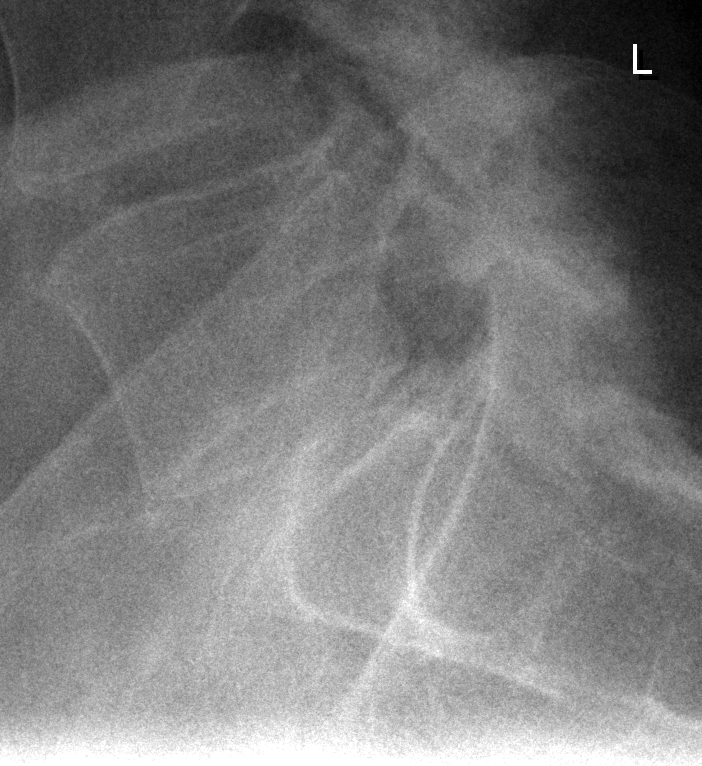

[5 of 5 positions shown; findings below may reference images not displayed]

FINDINGS: There are five non-rib bearing lumbar type vertebrae.
There is mild levoscoliosis.

The bones are demineralized.  There is disc narrowing at L3-4 and
L4-5.  No fractures or subluxation.  There is moderate facet
arthropathy.  Soft tissues unremarkable except for surgical sutures
in the right upper quadrant.  Several of these have broken.
IMPRESSION: Osteopenia and degenerative disc disease - no acute findings.

## 2011-07-25 ENCOUNTER — Ambulatory Visit: Payer: Medicare Other | Admitting: Internal Medicine

## 2011-07-29 ENCOUNTER — Ambulatory Visit (INDEPENDENT_AMBULATORY_CARE_PROVIDER_SITE_OTHER): Payer: Medicare Other | Admitting: Internal Medicine

## 2011-07-29 ENCOUNTER — Encounter: Payer: Self-pay | Admitting: Internal Medicine

## 2011-07-29 DIAGNOSIS — M76899 Other specified enthesopathies of unspecified lower limb, excluding foot: Secondary | ICD-10-CM

## 2011-07-29 DIAGNOSIS — R413 Other amnesia: Secondary | ICD-10-CM

## 2011-07-29 DIAGNOSIS — M7060 Trochanteric bursitis, unspecified hip: Secondary | ICD-10-CM

## 2011-07-29 DIAGNOSIS — I10 Essential (primary) hypertension: Secondary | ICD-10-CM

## 2011-07-29 DIAGNOSIS — E039 Hypothyroidism, unspecified: Secondary | ICD-10-CM

## 2011-07-29 NOTE — Patient Instructions (Signed)
-  Your thyroid test was a little underactive this time. We need to adjust your medication mildly. Please take one and a half of your thyroid pills on Tuesday and Thursday. The other days of the week you'll continue to take one pill a day. -part of your upper leg pain appears to be a type of bursitis. Please try aleve twice a day with food for 5 days then take it twice a day as needed for the pain. If it bothers your stomach please stop the medication and contact the office. -Please schedule repeat thyroid tests (tsh, ft4) prior to your next visit

## 2011-08-02 DIAGNOSIS — M7060 Trochanteric bursitis, unspecified hip: Secondary | ICD-10-CM | POA: Insufficient documentation

## 2011-08-02 DIAGNOSIS — F0390 Unspecified dementia without behavioral disturbance: Secondary | ICD-10-CM | POA: Insufficient documentation

## 2011-08-02 NOTE — Assessment & Plan Note (Signed)
Mild without red flag. Offered neurology evaluation and currently defers

## 2011-08-02 NOTE — Progress Notes (Signed)
  Subjective:    Patient ID: [name], female    DOB: January 30, 1921, 78 y.o.   MRN: [id number]  HPI Pt presents to clinic for followup of multiple medical problems. Notes intermittent left lateral upper leg pain without injury or radicular component. No leg weakness or parethesia. H/o hypothyroidism and tsh reviewed sl elevated. Mentions family thinks she has memory loss. Previously prescribed aricept and has at home but not taking. States has mild memory loss. Does remember family/friends names, doesn't get lost, and has had no safety issues.  Past Medical History  Diagnosis Date  . Arthritis   . Chicken pox   . Chronic kidney disease   . Colon polyps    Past Surgical History  Procedure Date  . Appendectomy   . Cholecystectomy   . Kidney stones   . Tonsillectomy   . Abdominal hysterectomy   . Hand surgery   . Feet surgery   . Shoulder arthroscopy     reports that she quit smoking about 70 years ago. Her smoking use included Cigarettes. She has a .2 pack-year smoking history. She has never used smokeless tobacco. She reports that she does not drink alcohol or use illicit drugs. family history includes Birth defects in her other and Heart disease in her father, mother, and other. Allergies  Allergen Reactions  . Codeine       Review of Systems see hpi     Objective:   Physical Exam  Physical Exam  Nursing note and vitals reviewed. Constitutional: Appears well-developed and well-nourished. No distress.  HENT:  Head: Normocephalic and atraumatic.  Right Ear: External ear normal.  Left Ear: External ear normal.  Eyes: Conjunctivae are normal. No scleral icterus.  Neck: Neck supple. Carotid bruit is not present.  Cardiovascular: Normal rate, regular rhythm and normal heart sounds.  Exam reveals no gallop and no friction rub.   No murmur heard. Pulmonary/Chest: Effort normal and breath sounds normal. No respiratory distress. He has no wheezes. no rales.  Lymphadenopathy:      He has no cervical adenopathy.  Neurological:Alert.  Skin: Skin is warm and dry. Not diaphoretic.  Psychiatric: Has a normal mood and affect.  MSK: +tenderness along left greater trochanter without bony abn. Gait nl      Assessment & Plan:

## 2011-08-02 NOTE — Assessment & Plan Note (Signed)
Normotensive and stable. Continue current regimen. Monitor bp as outpt and followup in clinic as scheduled.  

## 2011-08-02 NOTE — Assessment & Plan Note (Signed)
Attempt otc aleve prn with food and no other nsaids. Followup if no improvement or worsening.

## 2011-08-02 NOTE — Assessment & Plan Note (Signed)
Mildly underactive. Adjust dose and recheck in 8-10 +wks. asx

## 2011-08-16 IMAGING — RF DG MYELOGRAM LUMBAR
13 of 17 series · 13 of 17 positions shown · IV contrast (omnipaque)
Comparison: [facility] not available.

MYELOGRAM INJECTION
TECHNIQUE: Informed consent was obtained from the patient prior to
the procedure, including potential complications of headache,
allergy, infection and pain. Specific instructions were given
regarding 24 hour bedrest post procedure to prevent post-LP
headache.  A timeout procedure was performed.  With the patient
prone, the lower back was prepped with Betadine.  1% Lidocaine was
used for local anesthesia.  Lumbar puncture was performed by the
radiologist at the L2-L3  level using a 22 gauge needle with return
of clear CSF.  15 cc of Omnipaque 180 was injected into the
subarachnoid space .
CLINICAL DATA: Low back pain.  Left leg pain
TECHNIQUE: Multidetector CT imaging of the lumbar spine was
performed following myelography.  Multiplanar CT image
reconstructions were also generated.

[Series 1: myelogram  white · 1 of 1 slices shown (1 of 13)]
[im 1/1]
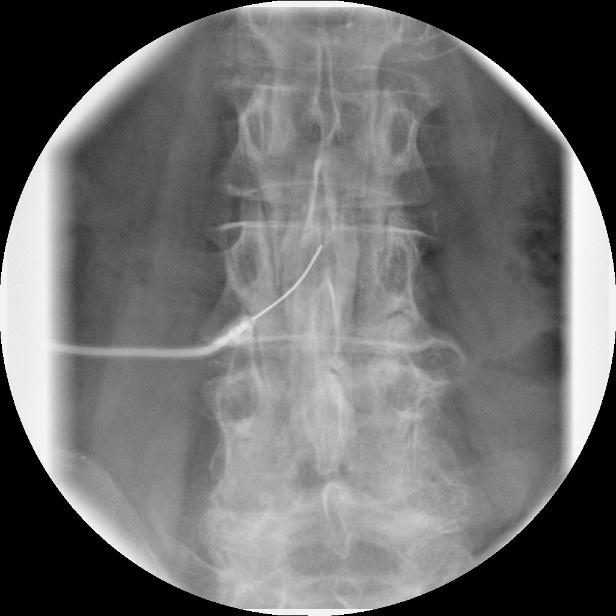

[Series 2: myelogram  white · 1 of 1 slices shown (2 of 13)]
[im 1/1]
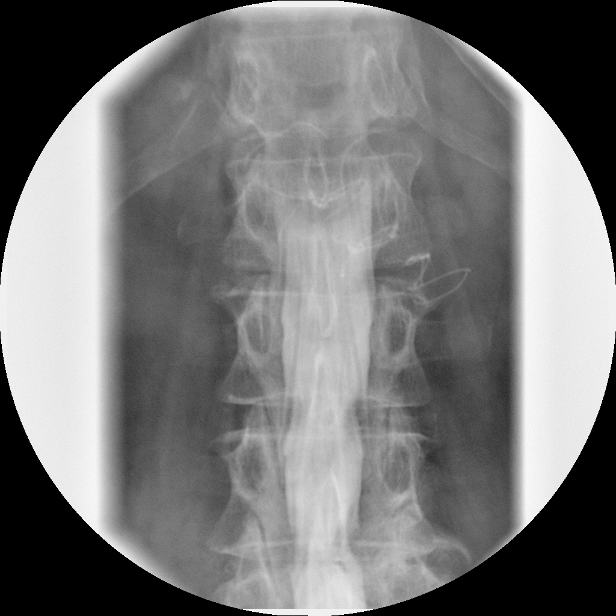

[Series 4: myelogram  white · 1 of 1 slices shown (3 of 13)]
[im 1/1]
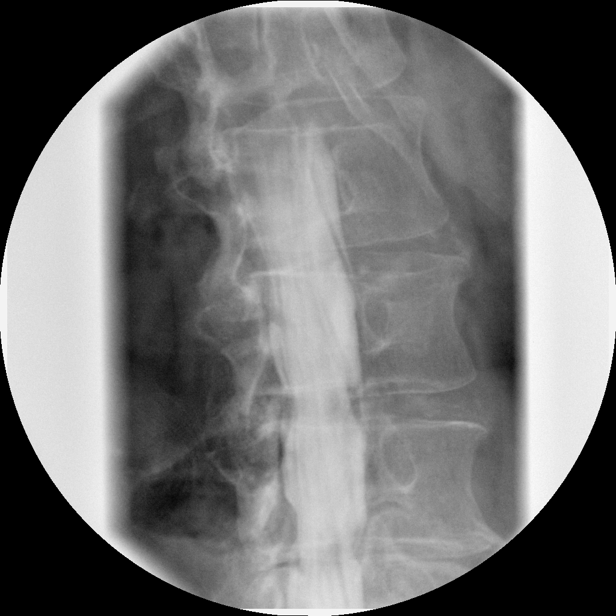

[Series 5: myelogram  white · 1 of 1 slices shown (4 of 13)]
[im 1/1]
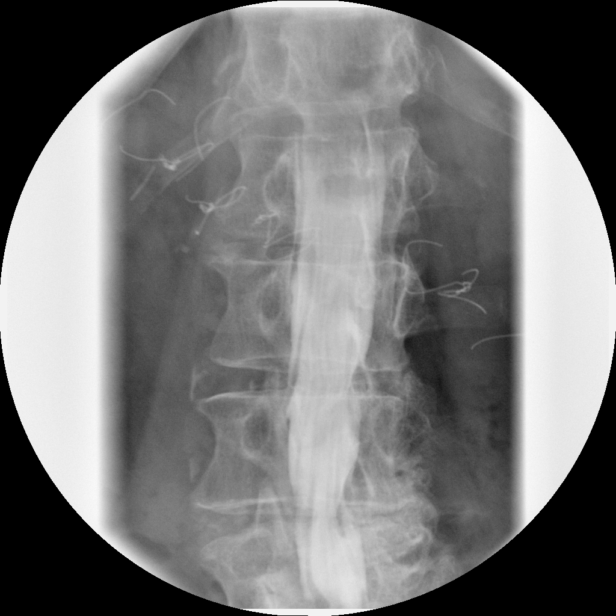

[Series 6: myelogram  white · 1 of 1 slices shown (5 of 13)]
[im 1/1]
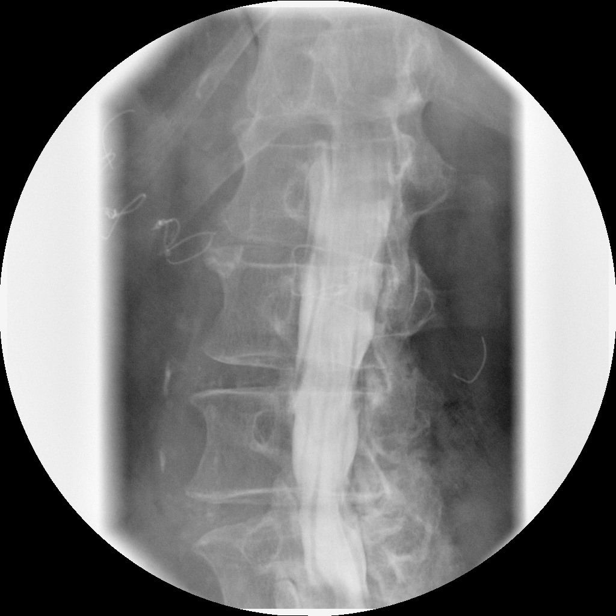

[Series 8: myelogram  white · 1 of 1 slices shown (6 of 13)]
[im 1/1]
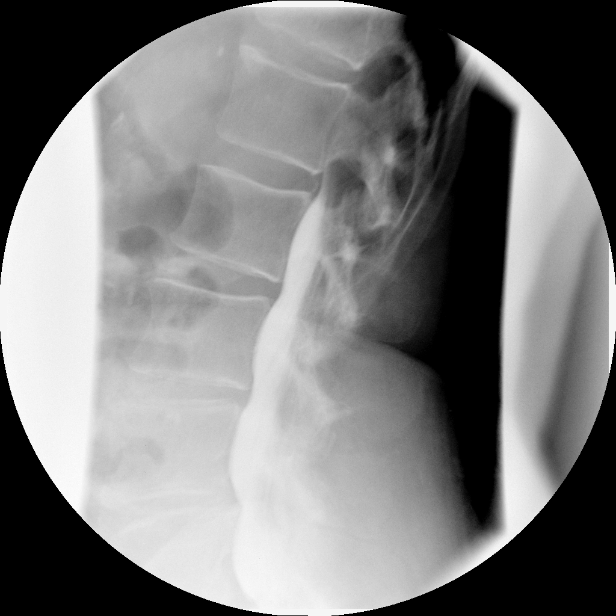

[Series 9: myelogram  white · 1 of 1 slices shown (7 of 13)]
[im 1/1]
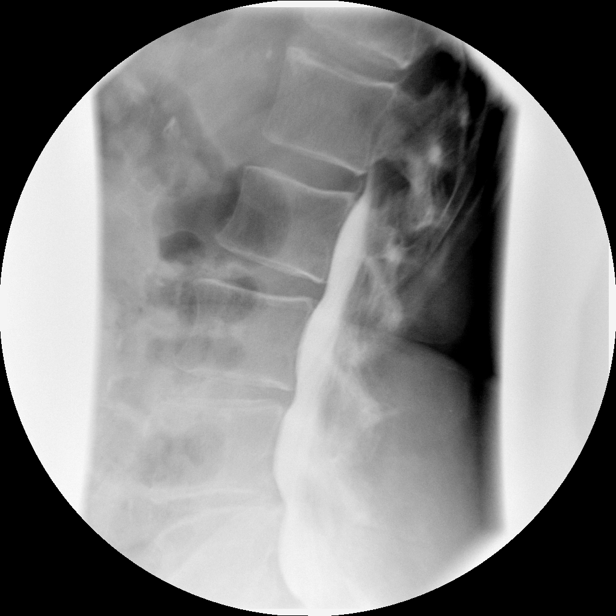

[Series 10: myelogram  white · 1 of 1 slices shown (8 of 13)]
[im 1/1]
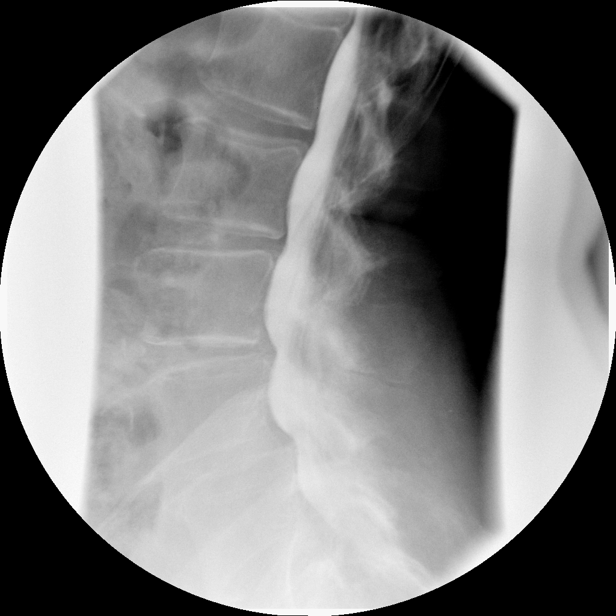

[Series 12: myelogram  white · 1 of 1 slices shown (9 of 13)]
[im 1/1]
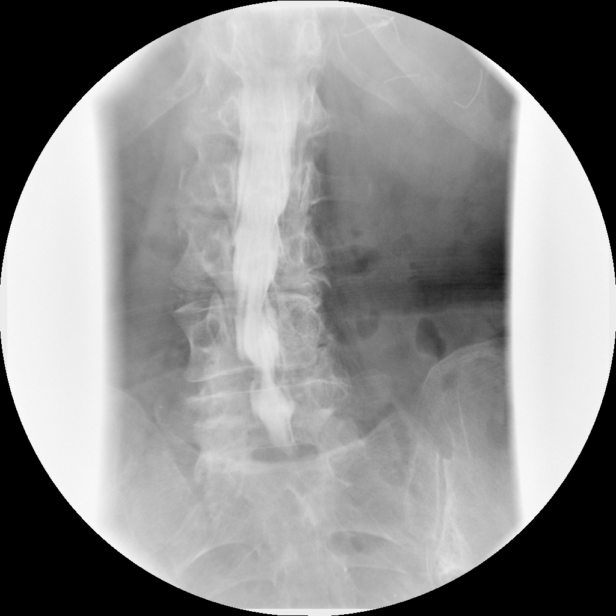

[Series 13: myelogram  white · 1 of 1 slices shown (10 of 13)]
[im 1/1]
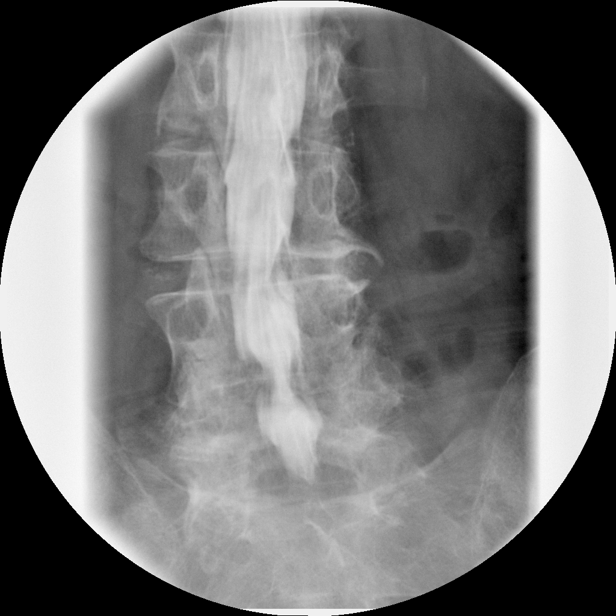

[Series 14: myelogram  white · 1 of 1 slices shown (11 of 13)]
[im 1/1]
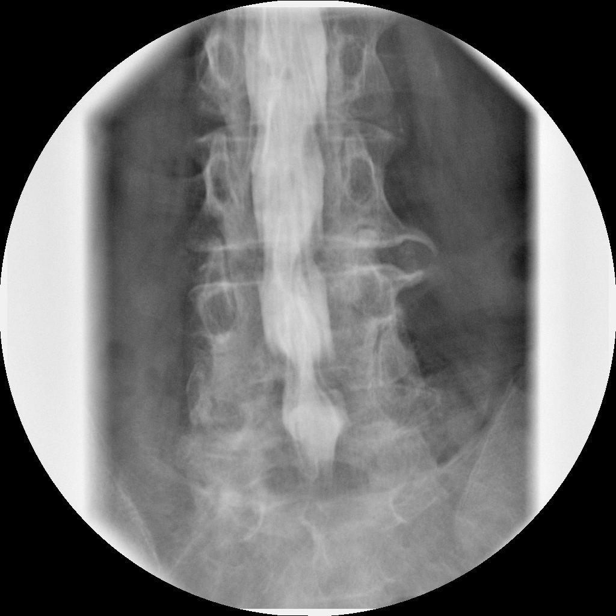

[Series 16: myelogram  white · 1 of 1 slices shown (12 of 13)]
[im 1/1]
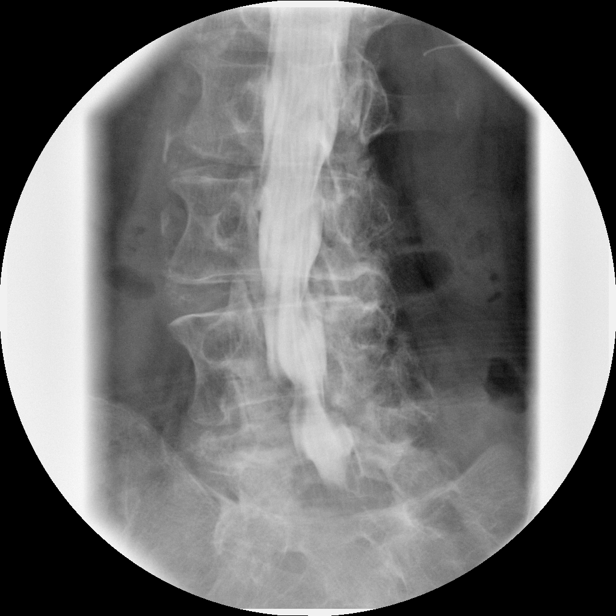

[Series 17: myelogram  white · 1 of 1 slices shown (13 of 13)]
[im 1/1]
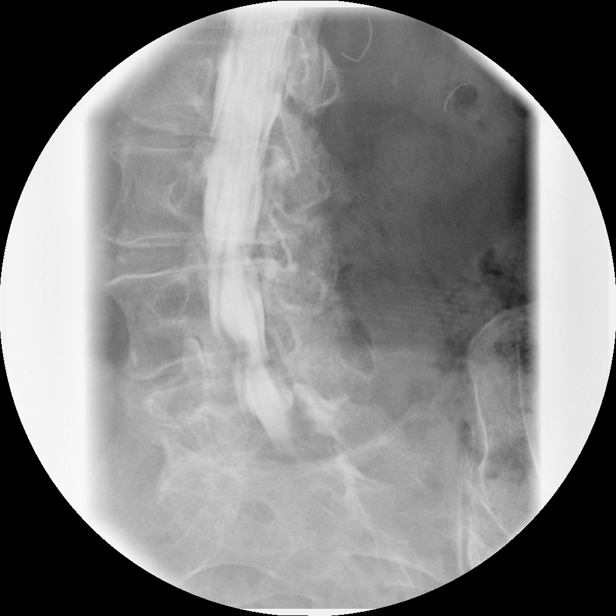

[13 of 17 positions shown; findings below may reference images not displayed]

IMPRESSION: Successful injection of  intrathecal contrast for myelography.

MYELOGRAM LUMBAR
FINDINGS: Good opacification lumbar subarachnoid space.
Multifactorial spinal stenosis at L4-5 relates to slip, ventral
extradural defect, and posterior element hypertrophy.  Bilateral L5
nerve root encroachment is present, left greater than right.
Asymmetric disc space narrowing at L3-4 on the right compromises
the right L4 nerve root in the lateral recess.

Standing flexion/extension shows dynamic instability with
anterolisthesis ranging between 4-5 mm.  No visible vertebral body
compression fractures are observed.

Fluoroscopy Time: 1.04 minutes
IMPRESSION: As above.

CT MYELOGRAPHY LUMBAR SPINE
FINDINGS: No prevertebral or paraspinous masses.  Nonaneurysmal
atherosclerotic calcification of the aorta.

L1-2: Mild annular bulging.  Mild facet arthropathy.

L2-3: Shallow calcific protrusion is non compressive.  Mild to
moderate facet arthropathy.

L3-4: Asymmetric loss of interspace height on the right is
accompanied by calcified central and rightward protrusion and
moderate posterior element hypertrophy.  Right greater than left L4
nerve root encroachment and the canal.  Right L3 nerve root
encroachment is present in the foramen.  Postsurgical changes,
left.

L4-5: Severe multifactorial spinal stenosis relates to 4 mm of
facet mediated slip, central protrusion, and advanced posterior
element hypertrophy affecting facets and ligamentum flava.  Left
greater than right L5 nerve root encroachment is present in the
canal, likely representing recurrent disc protrusion on the left
given the postsurgical change of the lamina.  Right L4 nerve
encroachment is present and the foramen due to bony overgrowth and
disc material as well as slip.

L5-S1: Mild bulge.  Moderate facet arthropathy.  No stenosis or
disc protrusion.
IMPRESSION: The dominant left-sided abnormality is at L4-5 where there is a
likely recurrent protrusion in the canal along with posterior
element hypertrophy, and 4-5 mm of facet mediated slip which
increases with the patient standing in flexion; left greater than
right L5 nerve root encroachment is observed.

Asymmetric loss of interspace height at L3-4 on the right,
associated with moderate facet overgrowth compromises the right
greater than left L4 and L3 nerve roots.

## 2011-08-16 IMAGING — CT CT L SPINE W/ CM
3 of 9 series · 10 of 27 positions shown, 11 images · IV contrast (omnipaque)
Comparison: [facility] not available.

MYELOGRAM INJECTION
TECHNIQUE: Informed consent was obtained from the patient prior to
the procedure, including potential complications of headache,
allergy, infection and pain. Specific instructions were given
regarding 24 hour bedrest post procedure to prevent post-LP
headache.  A timeout procedure was performed.  With the patient
prone, the lower back was prepped with Betadine.  1% Lidocaine was
used for local anesthesia.  Lumbar puncture was performed by the
radiologist at the L2-L3  level using a 22 gauge needle with return
of clear CSF.  15 cc of Omnipaque 180 was injected into the
subarachnoid space .
CLINICAL DATA: Low back pain.  Left leg pain
TECHNIQUE: Multidetector CT imaging of the lumbar spine was
performed following myelography.  Multiplanar CT image
reconstructions were also generated.

[Series 2: l spine bone · axial · 0.27mm/px · z∈[+35,+92]mm · 2 of 71 slices shown, 3 images]
[im 24/71  soft-tissue]
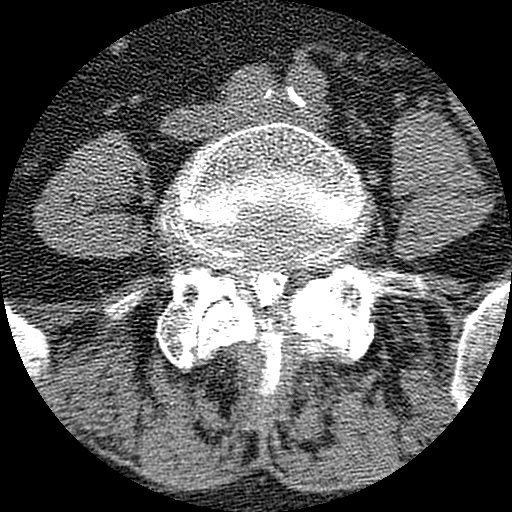
[im 24/71  bone]
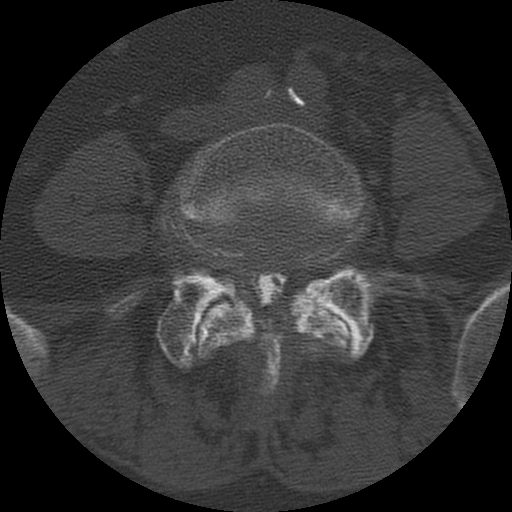
[im 47/71  bone]
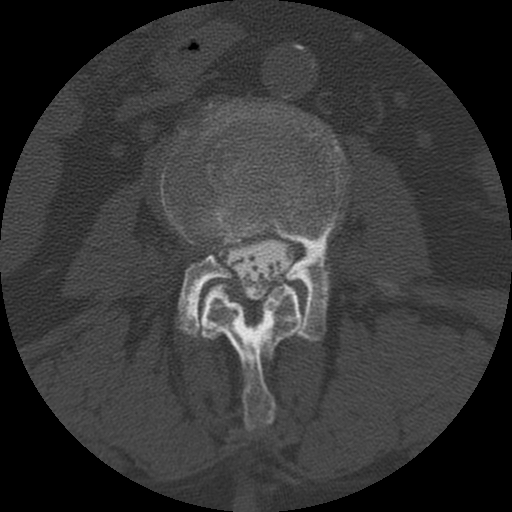

[Series 3: l spine soft · axial · 0.27mm/px · z∈[+20,+107]mm · 3 of 71 slices shown]
[im 18/71  soft-tissue]
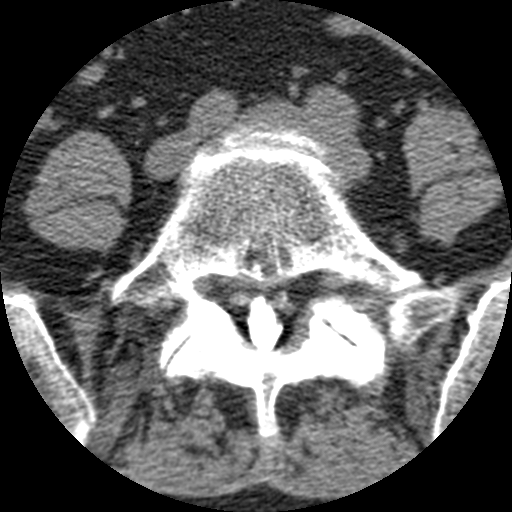
[im 36/71  soft-tissue]
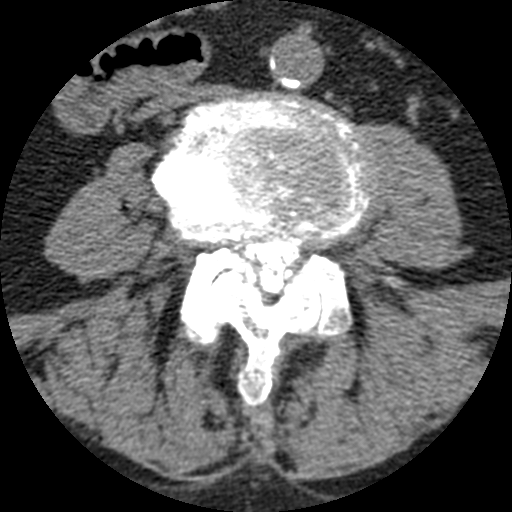
[im 53/71  soft-tissue]
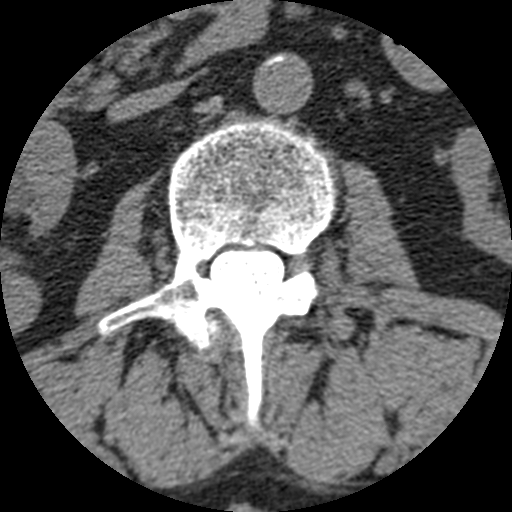

[Series 400: cor · coronal · 0.35mm/px · 5 of 50 slices shown]
[im 9/50  bone]
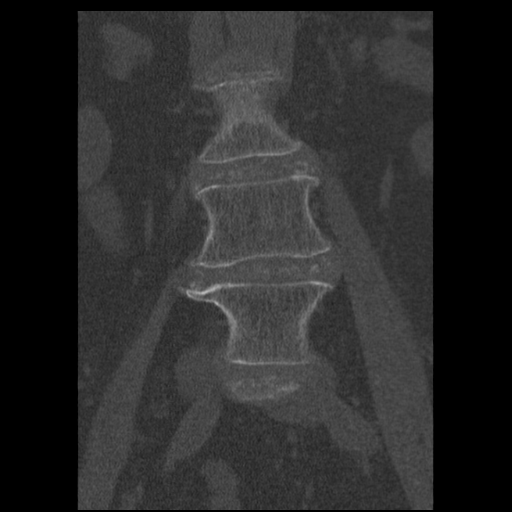
[im 17/50  bone]
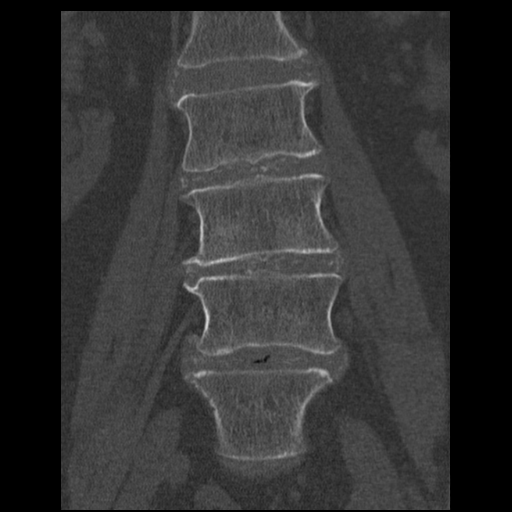
[im 25/50  bone]
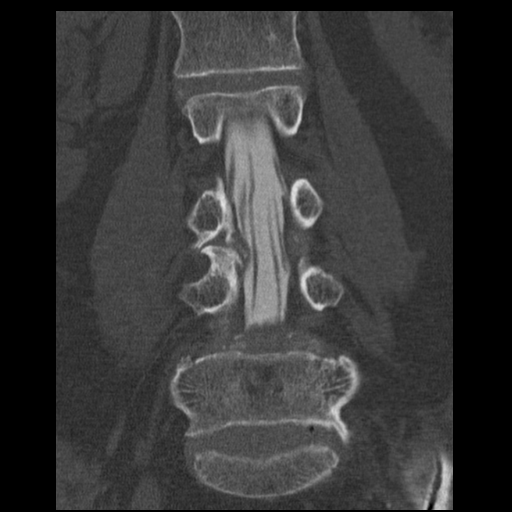
[im 33/50  bone]
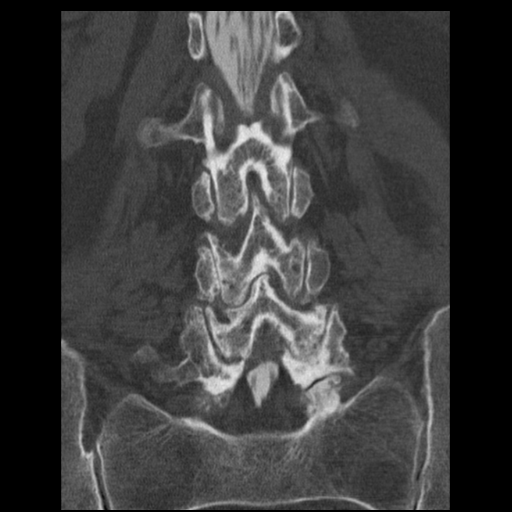
[im 41/50  bone]
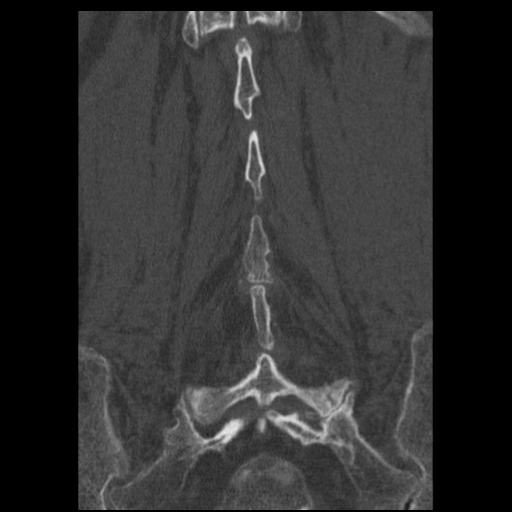

[10 of 27 positions shown; findings below may reference images not displayed]

IMPRESSION: Successful injection of  intrathecal contrast for myelography.

MYELOGRAM LUMBAR
FINDINGS: Good opacification lumbar subarachnoid space.
Multifactorial spinal stenosis at L4-5 relates to slip, ventral
extradural defect, and posterior element hypertrophy.  Bilateral L5
nerve root encroachment is present, left greater than right.
Asymmetric disc space narrowing at L3-4 on the right compromises
the right L4 nerve root in the lateral recess.

Standing flexion/extension shows dynamic instability with
anterolisthesis ranging between 4-5 mm.  No visible vertebral body
compression fractures are observed.

Fluoroscopy Time: 1.04 minutes
IMPRESSION: As above.

CT MYELOGRAPHY LUMBAR SPINE
FINDINGS: No prevertebral or paraspinous masses.  Nonaneurysmal
atherosclerotic calcification of the aorta.

L1-2: Mild annular bulging.  Mild facet arthropathy.

L2-3: Shallow calcific protrusion is non compressive.  Mild to
moderate facet arthropathy.

L3-4: Asymmetric loss of interspace height on the right is
accompanied by calcified central and rightward protrusion and
moderate posterior element hypertrophy.  Right greater than left L4
nerve root encroachment and the canal.  Right L3 nerve root
encroachment is present in the foramen.  Postsurgical changes,
left.

L4-5: Severe multifactorial spinal stenosis relates to 4 mm of
facet mediated slip, central protrusion, and advanced posterior
element hypertrophy affecting facets and ligamentum flava.  Left
greater than right L5 nerve root encroachment is present in the
canal, likely representing recurrent disc protrusion on the left
given the postsurgical change of the lamina.  Right L4 nerve
encroachment is present and the foramen due to bony overgrowth and
disc material as well as slip.

L5-S1: Mild bulge.  Moderate facet arthropathy.  No stenosis or
disc protrusion.
IMPRESSION: The dominant left-sided abnormality is at L4-5 where there is a
likely recurrent protrusion in the canal along with posterior
element hypertrophy, and 4-5 mm of facet mediated slip which
increases with the patient standing in flexion; left greater than
right L5 nerve root encroachment is observed.

Asymmetric loss of interspace height at L3-4 on the right,
associated with moderate facet overgrowth compromises the right
greater than left L4 and L3 nerve roots.

## 2011-09-06 ENCOUNTER — Encounter (HOSPITAL_COMMUNITY): Payer: Self-pay

## 2011-09-06 ENCOUNTER — Emergency Department (HOSPITAL_COMMUNITY): Payer: Medicare Other

## 2011-09-06 ENCOUNTER — Observation Stay (HOSPITAL_COMMUNITY)
Admission: EM | Admit: 2011-09-06 | Discharge: 2011-09-08 | Disposition: A | Discharge status: Home / Self Care | Payer: Medicare Other | Attending: Family Medicine | Admitting: Family Medicine

## 2011-09-06 ENCOUNTER — Other Ambulatory Visit: Payer: Self-pay

## 2011-09-06 DIAGNOSIS — R55 Syncope and collapse: Principal | ICD-10-CM | POA: Insufficient documentation

## 2011-09-06 DIAGNOSIS — I10 Essential (primary) hypertension: Secondary | ICD-10-CM | POA: Insufficient documentation

## 2011-09-06 DIAGNOSIS — E039 Hypothyroidism, unspecified: Secondary | ICD-10-CM | POA: Insufficient documentation

## 2011-09-06 DIAGNOSIS — R42 Dizziness and giddiness: Secondary | ICD-10-CM | POA: Insufficient documentation

## 2011-09-06 HISTORY — DX: Major depressive disorder, single episode, unspecified: F32.9

## 2011-09-06 HISTORY — DX: Hypothyroidism, unspecified: E03.9

## 2011-09-06 HISTORY — DX: Unspecified dementia, unspecified severity, without behavioral disturbance, psychotic disturbance, mood disturbance, and anxiety: F03.90

## 2011-09-06 HISTORY — DX: Shortness of breath: R06.02

## 2011-09-06 HISTORY — DX: Headache: R51

## 2011-09-06 HISTORY — DX: Essential (primary) hypertension: I10

## 2011-09-06 HISTORY — DX: Cardiac arrhythmia, unspecified: I49.9

## 2011-09-06 HISTORY — DX: Angina pectoris, unspecified: I20.9

## 2011-09-06 HISTORY — DX: Pneumonia, unspecified organism: J18.9

## 2011-09-06 HISTORY — DX: Depression, unspecified: F32.A

## 2011-09-06 HISTORY — DX: Acute upper respiratory infection, unspecified: J06.9

## 2011-09-06 HISTORY — DX: Anxiety disorder, unspecified: F41.9

## 2011-09-06 HISTORY — DX: Cardiac murmur, unspecified: R01.1

## 2011-09-06 HISTORY — DX: Anemia, unspecified: D64.9

## 2011-09-06 LAB — CBC
MCHC: 34.9 g/dL (ref 30.0–36.0)
Platelets: 252 10*3/uL (ref 150–400)
RDW: 12.7 % (ref 11.5–15.5)
WBC: 9.2 10*3/uL (ref 4.0–10.5)

## 2011-09-06 LAB — DIFFERENTIAL
Basophils Absolute: 0 10*3/uL (ref 0.0–0.1)
Basophils Relative: 0 % (ref 0–1)
Lymphocytes Relative: 30 % (ref 12–46)
Monocytes Absolute: 0.3 10*3/uL (ref 0.1–1.0)
Neutro Abs: 6 10*3/uL (ref 1.7–7.7)

## 2011-09-06 LAB — URINALYSIS, ROUTINE W REFLEX MICROSCOPIC
Glucose, UA: NEGATIVE mg/dL
Ketones, ur: NEGATIVE mg/dL
Nitrite: NEGATIVE
Protein, ur: NEGATIVE mg/dL
pH: 7 (ref 5.0–8.0)

## 2011-09-06 LAB — BASIC METABOLIC PANEL
CO2: 26 mEq/L (ref 19–32)
Calcium: 9.6 mg/dL (ref 8.4–10.5)
Chloride: 104 mEq/L (ref 96–112)
Creatinine, Ser: 0.83 mg/dL (ref 0.50–1.10)
GFR calc Af Amer: 71 mL/min — ABNORMAL LOW (ref >90)
Sodium: 141 mEq/L (ref 135–145)

## 2011-09-06 LAB — TROPONIN I: Troponin I: <0.3 ng/mL (ref <0.30)

## 2011-09-06 LAB — URINE MICROSCOPIC-ADD ON

## 2011-09-06 IMAGING — CR DG OR PORTABLE SPINE
1 series · 1 of 1 positions shown · non-contrast
Comparison: Intraoperative lumbar spine of the same day appear

CLINICAL DATA: Spinal stenosis at L3-4 and L4-5.

PORTABLE SPINE

[lateral]
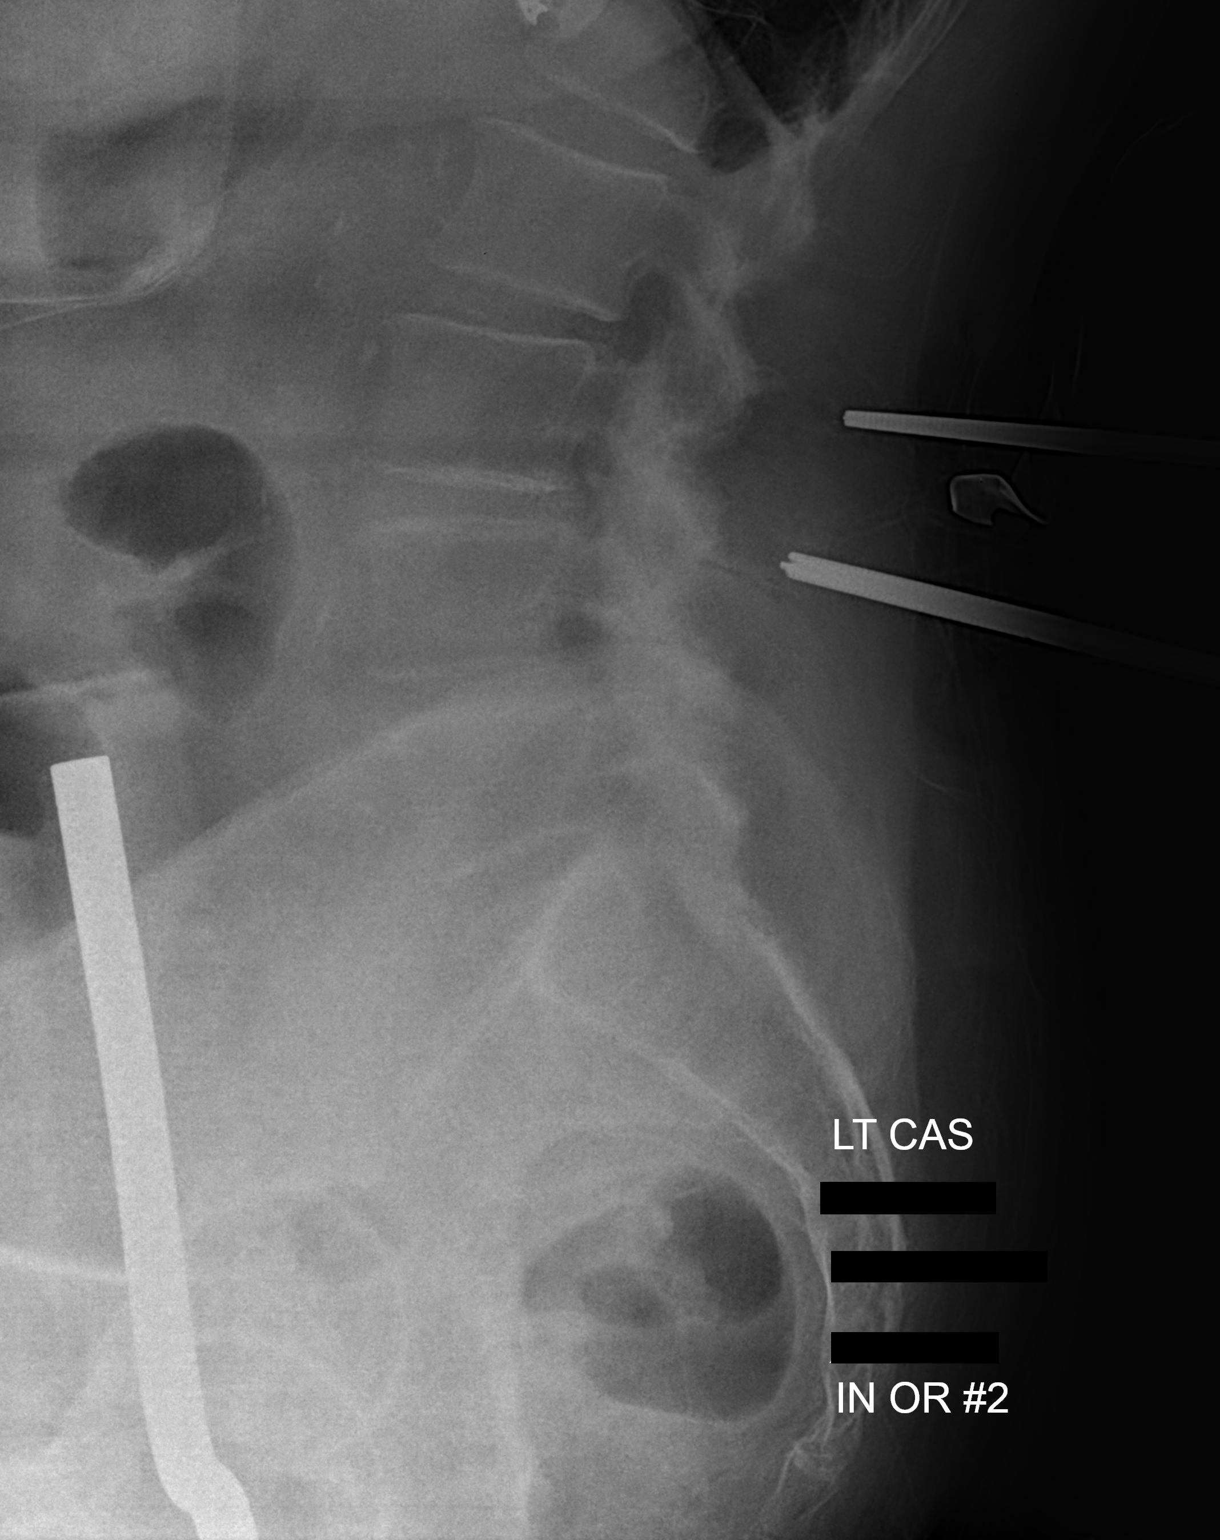

[1 of 1 positions shown; findings below may reference images not displayed]

FINDINGS: A single intraoperative lateral view is labeled [DATE]
p.m.  Surgical probes are directed at the L2-3 and L3-4 lumbar
disc levels.
IMPRESSION: Intraoperative localization of the L2-3 and L3-4 disc levels.

This differs from film #1 at where needles were directed at L3-4
and L4-5.

These findings were called to the operating room and communicated
to Dr. Akec at the time of interpretation, [DATE] p.m.
06/19/2010.

## 2011-09-06 IMAGING — CR DG OR PORTABLE SPINE
1 series · 1 of 1 positions shown · non-contrast
Comparison: 05/29/2010 post myelogram CT.  Intraoperative lateral
view of the lumbar spine 06/19/2010 [DATE] p.m.

CLINICAL DATA: Stenosis L3-4 and L4-5.

PORTABLE SPINE

[lateral]
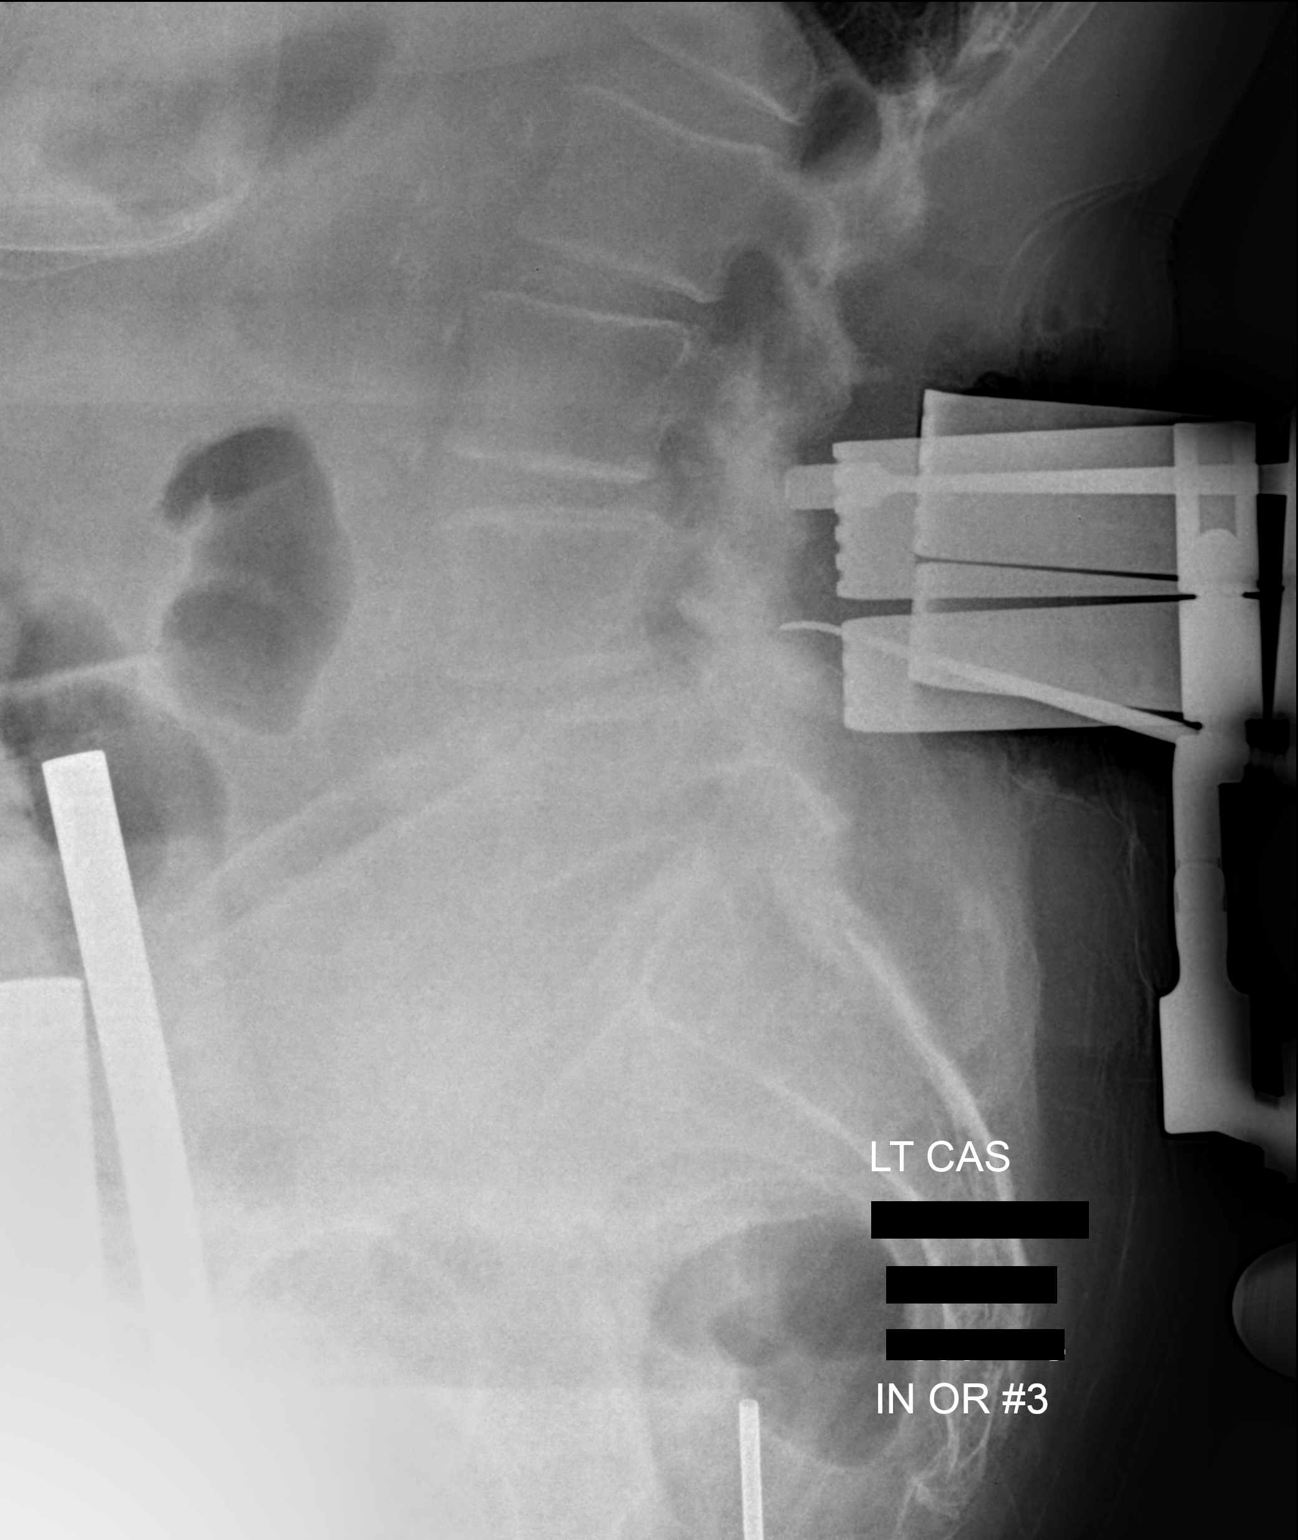

[1 of 1 positions shown; findings below may reference images not displayed]

FINDINGS: Last disc space is labeled L5-S1.  Minimal anterior slip
of L3 and L4.

Superior metallic probe posterior to the L3-4 level.  Inferior
metallic probe posterior to the lower L4 level just above the L4-5
disc space.  Metallic rakes spanning from the L3 pedicle through
the L5 pedicle level.

Vascular calcifications.
IMPRESSION: Localization L3-4 and lower L4 level.

## 2011-09-06 MED ORDER — KETOROLAC TROMETHAMINE 30 MG/ML IJ SOLN
30.0000 mg | Freq: Once | INTRAMUSCULAR | Status: AC
  Administered 2011-09-06: 30 mg via INTRAVENOUS
  Filled 2011-09-06: qty 1

## 2011-09-06 MED ORDER — ONDANSETRON HCL 4 MG/2ML IJ SOLN: 4.0000 mg | Freq: Three times a day (TID) | INTRAMUSCULAR | Status: DC | PRN

## 2011-09-06 NOTE — ED Notes (Signed)
Patient transported to CT 

## 2011-09-06 NOTE — ED Notes (Addendum)
Per EMS Pt was working in her yard and became lightheaded and dizzy, neighbor helped pt to get back in her house, pt thks she went to sleep, family drove pt to fire department, a&o x 3 with ems, severe headache, weakness, lightheaded.pt has a 20 gauge on her lower right arm.

## 2011-09-06 NOTE — ED Notes (Signed)
Family at bedside. 

## 2011-09-06 NOTE — ED Notes (Signed)
MD at bedside. 

## 2011-09-06 NOTE — ED Notes (Addendum)
Pt stated HA is much better, zero need voiced family at bedside

## 2011-09-06 NOTE — ED Notes (Signed)
Spoke with patients family and they states that she has not taken her B/P medications in 2 days

## 2011-09-06 NOTE — ED Provider Notes (Signed)
History     CSN: [id number]  Arrival date & time 09/06/11  1805   First MD Initiated Contact with Patient 09/06/11 1853      Chief Complaint  Patient presents with  . Dizziness    (Consider location/radiation/quality/duration/timing/severity/associated sxs/prior treatment) The history is provided by the patient and a relative.   78 year old female states that she went to her garden when she got dizzy and fell to her knees. It is uncertain if there was loss of consciousness and she's not sure how long she was on her knees. She needed assistance of a neighbor to get back in the house. Family noted that she was confused when they brought her back to the house. She's also complaining of a mild frontal headache. She denied any chest pain, heaviness, tightness, pressure. She denied any nausea, vomiting, diaphoresis. Family states that are met mentation has improved although it is not quite back to her baseline. They took her to a local fire station where blood pressure was noted to be 210/100.  Past Medical History  Diagnosis Date  . Arthritis   . Chicken pox   . Chronic kidney disease   . Colon polyps   . Hypertension   . Thyroid disease   . Dementia     Past Surgical History  Procedure Date  . Appendectomy   . Cholecystectomy   . Kidney stones   . Tonsillectomy   . Abdominal hysterectomy   . Hand surgery   . Feet surgery   . Shoulder arthroscopy     Family History  Problem Relation Age of Onset  . Heart disease Mother   . Heart disease Father   . Heart disease Other   . Birth defects Other     History  Substance Use Topics  . Smoking status: Former Smoker -- 0.2 packs/day for 1 years    Types: Cigarettes    Quit date: 06/02/1941  . Smokeless tobacco: Never Used  . Alcohol Use: No    OB History    Grav Para Term Preterm Abortions TAB SAB Ect Mult Living                  Review of Systems  All other systems reviewed and are negative.    Allergies    Codeine and Hydrocodone  Home Medications   Current Outpatient Rx  Name Route Sig Dispense Refill  . HYDROCHLOROTHIAZIDE 12.5 MG PO CAPS Oral Take 25 mg by mouth daily.    Marland Kitchen LEVOTHYROXINE SODIUM 50 MCG PO TABS Oral Take 50 mcg by mouth daily.    Marland Kitchen LORAZEPAM 1 MG PO TABS Oral Take 1 mg by mouth at bedtime as needed. For anxiety/sleep.      BP 154/79  Pulse 65  Temp(Src) 98.3 F (36.8 C) (Oral)  Resp 18  SpO2 99%  Physical Exam  Nursing note and vitals reviewed.  78 year old female who is resting comfortably and in no acute distress. Vital signs are significant for mild hypertension with blood pressure 154/79. Oxygen saturation is 99% which is normal. Head is normocephalic and atraumatic. PERRLA, EOMI. Fundi are poorly seen due to myosis, but no hemorrhage or exudate or papilledema is noted. Oropharynx is clear. Neck is supple nontender without adenopathy or bruit. Back is nontender. Lungs are clear without rales, wheezes, or rhonchi. Heart has regular rate and rhythm without murmur. Abdomen is soft, flat, nontender without masses or hepatosplenomegaly. Extremities have no cyanosis or edema, full range of motion is present. Skin is  warm and dry without rash. Neurologic: She is awake, alert, oriented to person and place and almost completely oriented to time. She thinks it is Thursday but knows it is April and 2013. Cranial nerves are intact. There no focal motor or sensory deficits.  ED Course  Procedures (including critical care time)  Results for orders placed during the hospital encounter of 09/06/11  CBC      Component Value Range   WBC 9.2  4.0 - 10.5 (K/uL)   RBC 4.36  3.87 - 5.11 (MIL/uL)   Hemoglobin 12.8  12.0 - 15.0 (g/dL)   HCT 16.1  09.6 - 04.5 (%)   MCV 84.2  78.0 - 100.0 (fL)   MCH 29.4  26.0 - 34.0 (pg)   MCHC 34.9  30.0 - 36.0 (g/dL)   RDW 40.9  81.1 - 91.4 (%)   Platelets 252  150 - 400 (K/uL)  DIFFERENTIAL      Component Value Range   Neutrophils Relative 66   43 - 77 (%)   Neutro Abs 6.0  1.7 - 7.7 (K/uL)   Lymphocytes Relative 30  12 - 46 (%)   Lymphs Abs 2.7  0.7 - 4.0 (K/uL)   Monocytes Relative 4  3 - 12 (%)   Monocytes Absolute 0.3  0.1 - 1.0 (K/uL)   Eosinophils Relative 1  0 - 5 (%)   Eosinophils Absolute 0.1  0.0 - 0.7 (K/uL)   Basophils Relative 0  0 - 1 (%)   Basophils Absolute 0.0  0.0 - 0.1 (K/uL)  BASIC METABOLIC PANEL      Component Value Range   Sodium 141  135 - 145 (mEq/L)   Potassium 4.3  3.5 - 5.1 (mEq/L)   Chloride 104  96 - 112 (mEq/L)   CO2 26  19 - 32 (mEq/L)   Glucose, Bld 105 (*) 70 - 99 (mg/dL)   BUN 13  6 - 23 (mg/dL)   Creatinine, Ser 7.82  0.50 - 1.10 (mg/dL)   Calcium 9.6  8.4 - 95.6 (mg/dL)   GFR calc non Af Amer 61 (*) >90 (mL/min)   GFR calc Af Amer 71 (*) >90 (mL/min)  URINALYSIS, ROUTINE W REFLEX MICROSCOPIC      Component Value Range   Color, Urine YELLOW  YELLOW    APPearance CLEAR  CLEAR    Specific Gravity, Urine 1.010  1.005 - 1.030    pH 7.0  5.0 - 8.0    Glucose, UA NEGATIVE  NEGATIVE (mg/dL)   Hgb urine dipstick NEGATIVE  NEGATIVE    Bilirubin Urine NEGATIVE  NEGATIVE    Ketones, ur NEGATIVE  NEGATIVE (mg/dL)   Protein, ur NEGATIVE  NEGATIVE (mg/dL)   Urobilinogen, UA 0.2  0.0 - 1.0 (mg/dL)   Nitrite NEGATIVE  NEGATIVE    Leukocytes, UA MODERATE (*) NEGATIVE   TROPONIN I      Component Value Range   Troponin I <0.30  <0.30 (ng/mL)  URINE MICROSCOPIC-ADD ON      Component Value Range   Squamous Epithelial / LPF FEW (*) RARE    WBC, UA 7-10  <3 (WBC/hpf)   RBC / HPF 0-2  <3 (RBC/hpf)   Bacteria, UA RARE  RARE    Ct Head Wo Contrast  09/06/2011  *RADIOLOGY REPORT*  Clinical Data: Lightheaded, dizziness  CT HEAD WITHOUT CONTRAST  Technique:  Contiguous axial images were obtained from the base of the skull through the vertex without contrast.  Comparison: 12/15/2006  Findings: No  evidence of parenchymal hemorrhage or extra-axial fluid collection. No mass lesion, mass effect, or midline  shift.  No CT evidence of acute infarction.  Subcortical white matter and periventricular small vessel ischemic changes.  Intracranial atherosclerosis.  Age appropriate cortical atrophy.  No ventriculomegaly.  The visualized paranasal sinuses are essentially clear. The mastoid air cells are unopacified.  No evidence of calvarial fracture.  IMPRESSION: No evidence of acute intracranial abnormality.  Small vessel ischemic changes with intracranial atherosclerosis.  Original Report Authenticated By: Charline Bills, M.D.   Dg Chest Portable 1 View  09/06/2011  *RADIOLOGY REPORT*  Clinical Data: Dizziness, headache  PORTABLE CHEST - 1 VIEW  Comparison: CT chest dated 02/25/2011  Findings: Lungs are clear. No pleural effusion or pneumothorax.  Cardiomediastinal silhouette is within normal limits.  Degenerative changes of the bilateral shoulders.  IMPRESSION: No evidence of acute cardiopulmonary disease.  Original Report Authenticated By: Charline Bills, M.D.    Date: 09/06/2011  Rate: 66  Rhythm: normal sinus rhythm  QRS Axis: left  Intervals: normal  ST/T Wave abnormalities: normal  Conduction Disutrbances:right bundle branch block and left anterior fascicular block  Narrative Interpretation: Right bundle branch block, left anterior fascicular block. When compared with ECG of 02/26/2011, no significant changes are seen.  Old EKG Reviewed: unchanged    CT, chest x-ray and laboratory evaluation are unremarkable. Orthostatic vital signs are unremarkable. I am concerned about her possible prolonged loss of consciousness. Consultation will be obtained with [facility] for possible admission.  Dr. Kaylyn Layer agrees to admit the patient.  1. Syncope       MDM  Dizziness episode with either near-syncope or syncope. Mild hypertension. Workup will be done including cardiac markers, ECG, CT of the head. Of note, she does have a history of dementia.        Dione Booze, MD 09/06/11 2300

## 2011-09-07 ENCOUNTER — Encounter (HOSPITAL_COMMUNITY): Payer: Self-pay

## 2011-09-07 ENCOUNTER — Other Ambulatory Visit: Payer: Self-pay

## 2011-09-07 DIAGNOSIS — R55 Syncope and collapse: Secondary | ICD-10-CM | POA: Diagnosis present

## 2011-09-07 LAB — CBC
HCT: 34.6 % — ABNORMAL LOW (ref 36.0–46.0)
MCH: 29 pg (ref 26.0–34.0)
MCV: 85.9 fL (ref 78.0–100.0)
RDW: 12.8 % (ref 11.5–15.5)
WBC: 7.2 10*3/uL (ref 4.0–10.5)

## 2011-09-07 LAB — BASIC METABOLIC PANEL
BUN: 15 mg/dL (ref 6–23)
CO2: 25 mEq/L (ref 19–32)
Chloride: 107 mEq/L (ref 96–112)
Creatinine, Ser: 0.92 mg/dL (ref 0.50–1.10)
Glucose, Bld: 107 mg/dL — ABNORMAL HIGH (ref 70–99)

## 2011-09-07 MED ORDER — ONDANSETRON HCL 4 MG PO TABS: 4.0000 mg | ORAL_TABLET | Freq: Four times a day (QID) | ORAL | Status: DC | PRN

## 2011-09-07 MED ORDER — SENNA 8.6 MG PO TABS
1.0000 | ORAL_TABLET | Freq: Two times a day (BID) | ORAL | Status: DC
  Administered 2011-09-08: 8.6 mg via ORAL
  Filled 2011-09-07 (×4): qty 1

## 2011-09-07 MED ORDER — ONDANSETRON HCL 4 MG/2ML IJ SOLN: 4.0000 mg | Freq: Four times a day (QID) | INTRAMUSCULAR | Status: DC | PRN

## 2011-09-07 MED ORDER — ONDANSETRON HCL 4 MG/2ML IJ SOLN: 4.0000 mg | Freq: Three times a day (TID) | INTRAMUSCULAR | Status: DC | PRN

## 2011-09-07 MED ORDER — DOCUSATE SODIUM 100 MG PO CAPS
100.0000 mg | ORAL_CAPSULE | Freq: Two times a day (BID) | ORAL | Status: DC
  Administered 2011-09-08: 100 mg via ORAL
  Filled 2011-09-07 (×4): qty 1

## 2011-09-07 MED ORDER — SODIUM CHLORIDE 0.9 % IV SOLN
INTRAVENOUS | Status: DC
  Administered 2011-09-07 (×2): via INTRAVENOUS

## 2011-09-07 MED ORDER — ACETAMINOPHEN 650 MG RE SUPP: 650.0000 mg | Freq: Four times a day (QID) | RECTAL | Status: DC | PRN

## 2011-09-07 MED ORDER — SODIUM CHLORIDE 0.9 % IJ SOLN
3.0000 mL | Freq: Two times a day (BID) | INTRAMUSCULAR | Status: DC
  Administered 2011-09-07 – 2011-09-08 (×3): 3 mL via INTRAVENOUS

## 2011-09-07 MED ORDER — ACETAMINOPHEN 325 MG PO TABS
650.0000 mg | ORAL_TABLET | Freq: Four times a day (QID) | ORAL | Status: DC | PRN
  Administered 2011-09-07 – 2011-09-08 (×2): 325 mg via ORAL
  Filled 2011-09-07: qty 1
  Filled 2011-09-07: qty 2

## 2011-09-07 MED ORDER — LEVOTHYROXINE SODIUM 50 MCG PO TABS
50.0000 ug | ORAL_TABLET | Freq: Every day | ORAL | Status: DC
  Administered 2011-09-07 – 2011-09-08 (×2): 50 ug via ORAL
  Filled 2011-09-07 (×3): qty 1
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
## 2011-09-07 NOTE — Progress Notes (Signed)
PROGRESS NOTE  [name] AVW:[id number] DOB: 06/02/1921 DOA: 09/06/2011 PCP: Letitia Libra, Ala Dach, MD, MD CARDIOLOGIST: Bryan Lemma, MD  Brief narrative: 78 year old woman presented to the emergency department with a history of possible syncope. She was working in her yard on her knees. She then woke up on the ground. There was no report of focal neurologic deficits. Blood pressure 210/100 at fire station.   Past medical history: Severe spinal stenosis with partial foot drop, hypothyroidism, dementia, hyperlipidemia, hypothyroidism  Consultants:  Physical therapy: Home health physical therapy.  Procedures:  2-D echocardiogram:  Interim History: Chart reviewed in detail and summarized as above.  Subjective: No complaints now.  Objective: Filed Vitals:   09/07/11 0510 09/07/11 0513 09/07/11 0516 09/07/11 0926  BP: 168/53 163/75 165/62 160/66  Pulse: 58 66 67 64  Temp:    98.3 F (36.8 C)  TempSrc:    Oral  Resp:    17  Height:      Weight:      SpO2:    96%    Intake/Output Summary (Last 24 hours) at 09/07/11 1030 Last data filed at 09/07/11 0935  Gross per 24 hour  Intake    120 ml  Output      2 ml  Net    118 ml    Exam:   General:  Appears calm and comfortable.  Eyes: Pupils round and react to light. Normal lids and irises.  ENT: Grossly normal hearing. Normal lips and tongue.  Cardiovascular: Regular rate and rhythm. No murmur, rub, gallop. No lower extremity edema.  Telemetry: Sinus rhythm. No arrhythmias.  Respiratory: Clear to auscultation bilaterally. No wheezes, rales, rhonchi. Normal respiratory effort.  Musculoskeletal: Grossly normal tone and strength in the upper and lower extremities bilaterally.  Psychiatric: Grossly normal mood and affect. Speech fluent and clear.  Neurologic: Cranial nerves 2-12 appear to be intact. No focal neurologic deficit.  Data Reviewed: Basic Metabolic Panel:  Lab 09/07/11 8295 09/06/11 2117  NA  141 141  K 3.9 4.3  CL 107 104  CO2 25 26  GLUCOSE 107* 105*  BUN 15 13  CREATININE 0.92 0.83  CALCIUM 9.0 9.6  MG -- --  PHOS -- --   CBC:  Lab 09/07/11 0457 09/06/11 2117  WBC 7.2 9.2  NEUTROABS -- 6.0  HGB 11.7* 12.8  HCT 34.6* 36.7  MCV 85.9 84.2  PLT 235 252   Cardiac Enzymes:  Lab 09/06/11 2117  CKTOTAL --  CKMB --  CKMBINDEX --  TROPONINI <0.30   CBG:  Lab 09/07/11 0908  GLUCAP 111*   Studies: Ct Head Wo Contrast  09/06/2011  *RADIOLOGY REPORT*  Clinical Data: Lightheaded, dizziness  CT HEAD WITHOUT CONTRAST  Technique:  Contiguous axial images were obtained from the base of the skull through the vertex without contrast.  Comparison: 12/15/2006  Findings: No evidence of parenchymal hemorrhage or extra-axial fluid collection. No mass lesion, mass effect, or midline shift.  No CT evidence of acute infarction.  Subcortical white matter and periventricular small vessel ischemic changes.  Intracranial atherosclerosis.  Age appropriate cortical atrophy.  No ventriculomegaly.  The visualized paranasal sinuses are essentially clear. The mastoid air cells are unopacified.  No evidence of calvarial fracture.  IMPRESSION: No evidence of acute intracranial abnormality.  Small vessel ischemic changes with intracranial atherosclerosis.  Original Report Authenticated By: Charline Bills, M.D.   Dg Chest Portable 1 View  09/06/2011  *RADIOLOGY REPORT*  Clinical Data: Dizziness, headache  PORTABLE CHEST -  1 VIEW  Comparison: CT chest dated 02/25/2011  Findings: Lungs are clear. No pleural effusion or pneumothorax.  Cardiomediastinal silhouette is within normal limits.  Degenerative changes of the bilateral shoulders.  IMPRESSION: No evidence of acute cardiopulmonary disease.  Original Report Authenticated By: Charline Bills, M.D.   Scheduled Meds:   . docusate sodium  100 mg Oral BID  . ketorolac  30 mg Intravenous Once  . levothyroxine  50 mcg Oral QAC breakfast  . senna  1  tablet Oral BID  . sodium chloride  3 mL Intravenous Q12H   Continuous Infusions:   . sodium chloride 125 mL/hr at 09/07/11 0351   EKG independently reviewed: Sinus rhythm. Right bundle branch block, left anterior fascicular block. No significant change since last EKG 02/20/2011   Assessment/Plan: 1. Syncope The history is most suggestive of vasovagal etiology. No evidence of cardiac arrhythmia, stroke or seizure. Followup echocardiogram.  2. Equivocal urinalysis: Order culture. 3. Hypertension: Hold hydrochlorothiazide for now. Monitor. 4. Hypothyroidism: Continue replacement therapy.  Code Status: Full code Family Communication: None at bedside Disposition Plan: Home with home health physical therapy.   Brendia Sacks, MD  [facility] Pager [phone]  If 7PM-7AM, please contact night-coverage [email or web address] Password [email or web address] 09/07/2011, 10:30 AM    LOS: 1 day

## 2011-09-07 NOTE — Progress Notes (Signed)
Pt daughter Tonette Bihari called to check on status because she received a phone call stating her mother had a stroke. Daughter not noted in contact information. Checked with pt and Mrs. Busey stated that it was all right to give her information. Updated daughter and she stated that she would call back later today and wanted her phone number added to contact information [phone].

## 2011-09-07 NOTE — Progress Notes (Signed)
Physical Therapy Evaluation Patient Details Name: [name] MRN: [id number] DOB: [birth year] Today's Date: 09/07/2011  Problem List:  Patient Active Problem List  Diagnoses  . Hypertension  . Anemia  . Hypothyroidism  . Chronic back pain  . Staghorn kidney stones  . Gastritis  . Chronic cough  . Dermatitis  . Leg swelling  . Dyspnea on exertion  . Leg pain  . Trochanteric bursitis  . Memory loss  . Syncope    Past Medical History:  Past Medical History  Diagnosis Date  . Arthritis   . Chicken pox   . Chronic kidney disease   . Colon polyps   . Hypertension   . Dementia   . Angina   . Dysrhythmia   . Heart murmur   . Shortness of breath   . Recurrent upper respiratory infection (URI)   . Pneumonia   . Hypothyroidism   . Anemia   . Headache   . Anxiety   . Depression    Past Surgical History:  Past Surgical History  Procedure Date  . Appendectomy   . Cholecystectomy   . Kidney stones   . Tonsillectomy   . Abdominal hysterectomy   . Hand surgery   . Feet surgery   . Shoulder arthroscopy   . Back surgery   . Tonsillectomy   . Eye surgery     cataract removed and eye lids lifted  . Fracture surgery     bilateral arms  . Tubal ligation     PT Assessment/Plan/Recommendation PT Assessment Clinical Impression Statement: Pt is an 78 y/o female admitted with syncope along with the below PT problem list.  Pt would benefit from acute PT to maximize independence and facilitate d/c home with HHPT. PT Recommendation/Assessment: Patient will need skilled PT in the acute care venue PT Problem List: Decreased activity tolerance;Decreased balance;Decreased mobility Barriers to Discharge: None PT Therapy Diagnosis : Generalized weakness;Difficulty walking PT Plan PT Frequency: Min 3X/week PT Treatment/Interventions: DME instruction;Gait training;Stair training;Functional mobility training;Therapeutic activities;Balance training;Patient/family education PT  Recommendation Follow Up Recommendations: Home health PT Equipment Recommended: None recommended by PT PT Goals  Acute Rehab PT Goals PT Goal Formulation: With patient Time For Goal Achievement: 7 days Pt will go Sit to Stand: with modified independence PT Goal: Sit to Stand - Progress: Goal set today Pt will go Stand to Sit: with modified independence PT Goal: Stand to Sit - Progress: Goal set today Pt will Ambulate: >150 feet;with modified independence;with least restrictive assistive device PT Goal: Ambulate - Progress: Goal set today Pt will Go Up / Down Stairs: 3-5 stairs;with modified independence;with least restrictive assistive device PT Goal: Up/Down Stairs - Progress: Goal set today  PT Evaluation Precautions/Restrictions  Precautions Precautions: Fall Required Braces or Orthoses: No Restrictions Weight Bearing Restrictions: No Prior Functioning  Home Living Lives With: Alone Type of Home: House Home Layout: One level Home Access: Stairs to enter Entrance Stairs-Rails: Right Entrance Stairs-Number of Steps: 3 Bathroom Shower/Tub: Forensic scientist: Standard Home Adaptive Equipment: Walker - rolling;Bedside commode/3-in-1;Straight cane Prior Function Level of Independence: Independent with basic ADLs;Independent with homemaking with ambulation;Independent with gait;Independent with transfers Able to Take Stairs?: Yes Driving: Yes Vocation: Retired Producer, television/film/video: Awake/alert Overall Cognitive Status: Appears within functional limits for tasks assessed Orientation Level: Oriented X4 Sensation/Coordination Sensation Light Touch: Impaired by gross assessment (Left LE numb) Stereognosis: Not tested Hot/Cold: Not tested Proprioception: Not tested Coordination Gross Motor Movements are Fluid and Coordinated: Yes Fine Motor Movements  are Fluid and Coordinated: Yes Extremity Assessment RUE Assessment RUE Assessment:  Within Functional Limits LUE Assessment LUE Assessment: Within Functional Limits RLE Assessment RLE Assessment: Within Functional Limits LLE Assessment LLE Assessment: Within Functional Limits Pain 0/10 with evaluation. Mobility (including Balance) Bed Mobility Bed Mobility: Yes Supine to Sit: 6: Modified independent (Device/Increase time);HOB flat Sit to Supine: 6: Modified independent (Device/Increase time);HOB flat Transfers Transfers: Yes Sit to Stand: 4: Min assist;With upper extremity assist;From bed Sit to Stand Details (indicate cue type and reason): Assist for balance with cues for safety. Stand to Sit: 4: Min assist;With upper extremity assist;To bed Stand to Sit Details: Assist to slow descent with cues for sequence. Ambulation/Gait Ambulation/Gait: Yes Ambulation/Gait Assistance: 4: Min assist Ambulation/Gait Assistance Details (indicate cue type and reason): Assist for balance with cues for tall posture inside RW. Ambulation Distance (Feet): 70 Feet Assistive device: Rolling walker Gait Pattern: Step-through pattern;Trunk flexed Stairs: No Wheelchair Mobility Wheelchair Mobility: No  Posture/Postural Control Posture/Postural Control: No significant limitations Balance Balance Assessed: No End of Session PT - End of Session Equipment Utilized During Treatment: Gait belt Activity Tolerance: Patient tolerated treatment well Patient left: in bed;with bed alarm set;with call bell in reach Nurse Communication: Mobility status for transfers;Mobility status for ambulation General Behavior During Session: [facility] for tasks performed Cognition: [facility] for tasks performed  Cephus Shelling 09/07/2011, 9:37 AM  09/07/2011 Cephus Shelling, PT, DPT [phone]

## 2011-09-08 LAB — GLUCOSE, CAPILLARY: Glucose-Capillary: 91 mg/dL (ref 70–99)

## 2011-09-08 MED ORDER — LORAZEPAM 1 MG PO TABS: 1.0000 mg | ORAL_TABLET | Freq: Every evening | ORAL | Status: DC | PRN

## 2011-09-08 MED ORDER — HYDROCHLOROTHIAZIDE 12.5 MG PO CAPS: 25.0000 mg | ORAL_CAPSULE | Freq: Every day | ORAL | Status: DC

## 2011-09-08 MED ORDER — LEVOTHYROXINE SODIUM 50 MCG PO TABS: 50.0000 ug | ORAL_TABLET | Freq: Every day | ORAL | Status: DC

## 2011-09-08 NOTE — Progress Notes (Signed)
Went over all discharge instructions with pt and family. Pt has short term memory loss. Has 1 prescription MD called rest into Pharmacy.Family aware of need for Follow-up appt within 1 week and the need to make it.  Pt has been set up with HH. Genevieve Norlander arrangements made per Social work . Family given phone # if they do not contact pt within 24 hrs. Pt has "life alert" necklace and bracelet. Encouraged pt to use the products family has gotten for her for safety. Pt D/C per W/C with all belongings. Marisa Cyphers RN

## 2011-09-08 NOTE — Progress Notes (Signed)
PROGRESS NOTE  [name] VWU:[id number] DOB: [birth year] DOA: 09/06/2011 PCP: Letitia Libra, Ala Dach, MD, MD CARDIOLOGIST: Bryan Lemma, MD  Brief narrative: 78 year old woman presented to the emergency department with a history of possible syncope. She was working in her yard on her knees. She then woke up on the ground. There was no report of focal neurologic deficits. Blood pressure 210/100 at fire station.   Past medical history: Severe spinal stenosis with partial foot drop, hypothyroidism, dementia, hyperlipidemia, hypothyroidism  Consultants:  Physical therapy: Home health physical therapy.  Procedures:  09/08/2011 2-D echocardiogram: Left ventricular ejection fraction 60-65%. Normal wall motion. Moderately mitral valve regurgitation.  Interim History: Gen. documentation review. Episodes of bradycardia. Asymptomatic.  Subjective: No complaints except minor headache.  Objective: Filed Vitals:   09/08/11 0500 09/08/11 0502 09/08/11 0505 09/08/11 0631  BP: 161/78 174/83 182/89 141/72  Pulse: 62 62 63 57  Temp:    97.5 F (36.4 C)  TempSrc:    Oral  Resp:    16  Height:      Weight:      SpO2:    97%    Intake/Output Summary (Last 24 hours) at 09/08/11 1129 Last data filed at 09/08/11 0831  Gross per 24 hour  Intake    480 ml  Output    400 ml  Net     80 ml    Exam:   General:  Appears calm and comfortable.  Cardiovascular: Regular rate and rhythm. No murmur, rub, gallop.   Telemetry: Sinus rhythm/sinus bradycardia. No arrhythmias.  Respiratory: Clear to auscultation bilaterally. No wheezes, rales, rhonchi. Normal respiratory effort.  Psychiatric: Grossly normal mood and affect. Speech fluent and clear.  Data Reviewed: Basic Metabolic Panel:  Lab 09/07/11 2956 09/06/11 2117  NA 141 141  K 3.9 4.3  CL 107 104  CO2 25 26  GLUCOSE 107* 105*  BUN 15 13  CREATININE 0.92 0.83  CALCIUM 9.0 9.6  MG -- --  PHOS -- --   CBC:  Lab 09/07/11  0457 09/06/11 2117  WBC 7.2 9.2  NEUTROABS -- 6.0  HGB 11.7* 12.8  HCT 34.6* 36.7  MCV 85.9 84.2  PLT 235 252   Cardiac Enzymes:  Lab 09/06/11 2117  CKTOTAL --  CKMB --  CKMBINDEX --  TROPONINI <0.30   CBG:  Lab 09/08/11 0609 09/07/11 0908  GLUCAP 91 111*   Studies: Ct Head Wo Contrast  09/06/2011  *RADIOLOGY REPORT*  Clinical Data: Lightheaded, dizziness  CT HEAD WITHOUT CONTRAST  Technique:  Contiguous axial images were obtained from the base of the skull through the vertex without contrast.  Comparison: 12/15/2006  Findings: No evidence of parenchymal hemorrhage or extra-axial fluid collection. No mass lesion, mass effect, or midline shift.  No CT evidence of acute infarction.  Subcortical white matter and periventricular small vessel ischemic changes.  Intracranial atherosclerosis.  Age appropriate cortical atrophy.  No ventriculomegaly.  The visualized paranasal sinuses are essentially clear. The mastoid air cells are unopacified.  No evidence of calvarial fracture.  IMPRESSION: No evidence of acute intracranial abnormality.  Small vessel ischemic changes with intracranial atherosclerosis.  Original Report Authenticated By: Charline Bills, M.D.   Dg Chest Portable 1 View  09/06/2011  *RADIOLOGY REPORT*  Clinical Data: Dizziness, headache  PORTABLE CHEST - 1 VIEW  Comparison: CT chest dated 02/25/2011  Findings: Lungs are clear. No pleural effusion or pneumothorax.  Cardiomediastinal silhouette is within normal limits.  Degenerative changes of the bilateral shoulders.  IMPRESSION:  No evidence of acute cardiopulmonary disease.  Original Report Authenticated By: Charline Bills, M.D.   Scheduled Meds:    . docusate sodium  100 mg Oral BID  . levothyroxine  50 mcg Oral QAC breakfast  . senna  1 tablet Oral BID  . sodium chloride  3 mL Intravenous Q12H   Continuous Infusions:    . DISCONTD: sodium chloride 125 mL/hr at 09/07/11 1233   EKG independently reviewed: Sinus rhythm.  Right bundle branch block, left anterior fascicular block. No significant change since last EKG 02/20/2011   Assessment/Plan: 1. Syncope: Resolved. The history is most suggestive of vasovagal etiology. No evidence of stroke or seizure. Echocardiogram reassuring. 2. Equivocal urinalysis: Urine culture pending. 3. Hypertension: Resume hydrochlorothiazide for now. Monitor. 4. Hypothyroidism: Continue replacement therapy.  Stable for discharge. Discussed at bedside with son and daughter-in-law all questions answered to their satisfaction.  Code Status: Full code Family Communication: None at bedside Disposition Plan: Home with home health physical therapy.   Brendia Sacks, MD  [facility] Pager [phone]  If 7PM-7AM, please contact night-coverage [email or web address] Password [email or web address] 09/08/2011, 11:29 AM    LOS: 2 days

## 2011-09-08 NOTE — Progress Notes (Signed)
   CARE MANAGEMENT NOTE 09/08/2011  Patient:  Deborah, Horton   Account Number:  [id number]  Date Initiated:  09/08/2011  Documentation initiated by:  Darlyne Russian  Subjective/Objective Assessment:   Patient admitted due to syncopal episode at home.     Action/Plan:   Patient lives at home alone, she will be moving from her home to an independent senior apartment maybe at the end of the month.   Anticipated DC Date:  09/08/2011   Anticipated DC Plan:  HOME W HOME HEALTH SERVICES      DC Planning Services  CM consult      Choice offered to / List presented to:  C-1 Patient           Status of service:  In process, will continue to follow Medicare Important Message given?   (If response is "NO", the following Medicare IM given date fields will be blank) Date Medicare IM given:   Date Additional Medicare IM given:    Discharge Disposition:    Per UR Regulation:    If discussed at Long Length of Stay Meetings, dates discussed:    Comments:  04//01/2012  1500 Darlyne Russian RN, [place] [phone] [facility] for [facility] health RN and PT.  09/08/2011 1000 Darlyne Russian RN, [place]  [phone]  Met with patient to discuss discharge planning. She currently lives in a 6 bedroom home and will be moving to a 3 room indepedent senior apartment, maybe at the end of the month.  Patient not able to remember the name of the apartment complex or location. Phone call from daughter in law Maryruth Bun to patient while CM in the room, patient gave phone to CM regarding the name of the apartment complex and home health agencies. The apartment complex is [facility] [address] off [address]  She chose [place] for home health referrals. CM to continue to follow for discharge needs.

## 2011-09-08 NOTE — Discharge Summary (Signed)
Physician Discharge Summary  [name]:[id number] DOB: 06/12/1920 DOA: 09/06/2011  PCP: Letitia Libra, Ala Dach, MD, MD CARDIOLOGIST: Bryan Lemma, MD  Admit date: 09/06/2011 Discharge date: 09/08/2011  Discharge Diagnoses:  1. Syncope 2. Equivocal urinalysis 3. Hypertension 4. Hypothyroidism  Discharge Condition: Improved  Disposition: Home with home health physical therapy, RN to ensure patient taking medications for that the  History of present illness:  78 year old woman presented to the emergency department with a history of possible syncope. She was working in her yard on her knees. She then woke up on the ground. There was no report of focal neurologic deficits. Blood pressure 210/100 at fire station.   Hospital Course:  Mrs. Betsch was admitted to the medical floor. Syncope evaluation was conducted and was unrevealing. Her history is most suggestive of vasovagal syncope she has had no recurrence. The patient had been working out in her yard on her knees trying to remove lattice work which became a lightheaded and passed out. She also has not been taking her blood pressure medications quite correctly as she is missed doses because she excellently packed him up in anticipation of her move to another house. Telemetry has been unremarkable. Her evaluation is complete and she is stable for discharge. 1. Syncope: Resolved. The history is most suggestive of vasovagal etiology. No evidence of stroke or seizure. Echocardiogram reassuring.  2. Equivocal urinalysis: Urine culture pending. Will followup. 3. Hypertension: Resume hydrochlorothiazide for now.  4. Hypothyroidism: Continue replacement therapy.  Consultants:  Physical therapy: Home health physical therapy.  Procedures:  09/08/2011 2-D echocardiogram: Left ventricular ejection fraction 60-65%. Normal wall motion. Moderately mitral valve regurgitation.  Discharge Instructions  Discharge Orders    Future Orders Please  Complete By Expires   Diet - low sodium heart healthy      Increase activity slowly        Medication List  As of 09/08/2011  2:54 PM   TAKE these medications         hydrochlorothiazide 12.5 MG capsule   Commonly known as: MICROZIDE   Take 2 capsules (25 mg total) by mouth daily.      levothyroxine 50 MCG tablet   Commonly known as: SYNTHROID, LEVOTHROID   Take 1 tablet (50 mcg total) by mouth daily.      LORazepam 1 MG tablet   Commonly known as: ATIVAN   Take 1 tablet (1 mg total) by mouth at bedtime as needed. For anxiety/sleep.           Follow-up Information    Follow up with Letitia Libra, Ala Dach, MD in 1 week.         The results of significant diagnostics from this hospitalization (including imaging, microbiology, ancillary and laboratory) are listed below for reference.    Significant Diagnostic Studies: Ct Head Wo Contrast  09/06/2011  *RADIOLOGY REPORT*  Clinical Data: Lightheaded, dizziness  CT HEAD WITHOUT CONTRAST  Technique:  Contiguous axial images were obtained from the base of the skull through the vertex without contrast.  Comparison: 12/15/2006  Findings: No evidence of parenchymal hemorrhage or extra-axial fluid collection. No mass lesion, mass effect, or midline shift.  No CT evidence of acute infarction.  Subcortical white matter and periventricular small vessel ischemic changes.  Intracranial atherosclerosis.  Age appropriate cortical atrophy.  No ventriculomegaly.  The visualized paranasal sinuses are essentially clear. The mastoid air cells are unopacified.  No evidence of calvarial fracture.  IMPRESSION: No evidence of acute intracranial abnormality.  Small  vessel ischemic changes with intracranial atherosclerosis.  Original Report Authenticated By: Charline Bills, M.D.   Dg Chest Portable 1 View  09/06/2011  *RADIOLOGY REPORT*  Clinical Data: Dizziness, headache  PORTABLE CHEST - 1 VIEW  Comparison: CT chest dated 02/25/2011  Findings: Lungs are  clear. No pleural effusion or pneumothorax.  Cardiomediastinal silhouette is within normal limits.  Degenerative changes of the bilateral shoulders.  IMPRESSION: No evidence of acute cardiopulmonary disease.  Original Report Authenticated By: Charline Bills, M.D.   Labs: Basic Metabolic Panel:  Lab 09/07/11 1610 09/06/11 2117  NA 141 141  K 3.9 4.3  CL 107 104  CO2 25 26  GLUCOSE 107* 105*  BUN 15 13  CREATININE 0.92 0.83  CALCIUM 9.0 9.6  MG -- --  PHOS -- --   CBC:  Lab 09/07/11 0457 09/06/11 2117  WBC 7.2 9.2  NEUTROABS -- 6.0  HGB 11.7* 12.8  HCT 34.6* 36.7  MCV 85.9 84.2  PLT 235 252   Cardiac Enzymes:  Lab 09/06/11 2117  CKTOTAL --  CKMB --  CKMBINDEX --  TROPONINI <0.30   CBG:  Lab 09/08/11 0609 09/07/11 0908  GLUCAP 91 111*    Time coordinating discharge: 25 minutes.  Signed:  Brendia Sacks, MD  [facility] 09/08/2011, 2:54 PM

## 2011-09-08 NOTE — Progress Notes (Signed)
*  PRELIMINARY RESULTS* Echocardiogram 2D Echocardiogram has been performed.  Katheren Puller 09/08/2011, 9:57 AM

## 2011-09-08 NOTE — Progress Notes (Signed)
   CARE MANAGEMENT NOTE 09/08/2011  Patient:  [name], [name]   Account Number:  [id number]  Date Initiated:  09/08/2011  Documentation initiated by:  Darlyne Russian  Subjective/Objective Assessment:   Patient admitted due to syncopal episode at home.     Action/Plan:   Patient lives at home alone, she will be moving from her home to an independent senior apartment maybe at the end of the month.   Anticipated DC Date:  09/08/2011   Anticipated DC Plan:  HOME W HOME HEALTH SERVICES      DC Planning Services  CM consult      Choice offered to / List presented to:  C-1 Patient           Status of service:  In process, will continue to follow Medicare Important Message given?   (If response is "NO", the following Medicare IM given date fields will be blank) Date Medicare IM given:   Date Additional Medicare IM given:    Discharge Disposition:    Per UR Regulation:    If discussed at Long Length of Stay Meetings, dates discussed:    Comments:  09/08/2011 1000 Darlyne Russian RN, [place]  [phone]  Met with patient to discuss discharge planning. She currently lives in a 6 bedroom home and will be moving to a 3 room indepedent senior apartment, maybe at the end of the month.  Patient not able to remember the name of the apartment complex or location. Phone call from daughter in law Maryruth Bun to patient while CM in the room, patient gave phone to CM regarding the name of the apartment complex and home health agencies. The apartment complex is [facility] off [address]  She chose [place] for home health referrals. CM to continue to follow for discharge needs.

## 2011-09-08 NOTE — Progress Notes (Signed)
Pt noted having a 2.11 second pause. Asymptomatic when assessed. Denies chest pain or discomfort. Pt stated I feel fine.Vitals noted 97.5T, 57HR, 141/72BP, 16R, 97% O2 sats.

## 2011-09-09 LAB — URINE CULTURE: Colony Count: >=100000

## 2011-09-11 ENCOUNTER — Telehealth: Payer: Self-pay | Admitting: Internal Medicine

## 2011-09-11 NOTE — Telephone Encounter (Signed)
Frazier Richards will going to patient home tomorrow for Nurse Visit (order from hospital stay)

## 2011-09-12 ENCOUNTER — Telehealth: Payer: Self-pay | Admitting: Internal Medicine

## 2011-09-12 NOTE — Telephone Encounter (Signed)
She was in [facility].  [name] was sent by the hospital.  Vitals are stable 130/70 resp 16 temp 98.8 gait unstable   She has left leg muscle pain.  The family was there today and are aware they need to become more involved with her.  The family will start med management and blood pressure readings.  She is on actz 12.5 2 daily levothyorixin 50 mcg daily and lorazepam 1 mg at bedtime  She is taking aleve prn.

## 2011-09-15 ENCOUNTER — Encounter: Payer: Self-pay | Admitting: Internal Medicine

## 2011-09-15 ENCOUNTER — Ambulatory Visit (INDEPENDENT_AMBULATORY_CARE_PROVIDER_SITE_OTHER): Payer: Medicare Other | Admitting: Internal Medicine

## 2011-09-15 VITALS — BP 122/60 | HR 62 | Temp 98.5°F | Resp 18 | Ht 65.0 in | Wt 172.0 lb

## 2011-09-15 DIAGNOSIS — R55 Syncope and collapse: Secondary | ICD-10-CM

## 2011-09-15 DIAGNOSIS — E039 Hypothyroidism, unspecified: Secondary | ICD-10-CM

## 2011-09-15 LAB — TSH: TSH: 2.26 u[IU]/mL (ref 0.350–4.500)

## 2011-09-15 LAB — T4, FREE: Free T4: 1.42 ng/dL (ref 0.80–1.80)

## 2011-09-15 NOTE — Progress Notes (Signed)
  Subjective:    Patient ID: [name], female    DOB: 24-Dec-1920, 78 y.o.   MRN: [id number]  HPI Pt presents to clinic for hospital follow up of syncope. Was working outside, stood up and felt funny and subsequently awoke on the ground. LOC unwitnessed. No apparent injury was sustained. Had elevated ED and was admitted to hospital. Echo demonstrated mod MR without thrombus, telemetry unremarkable, head ct without acute finding, lab work unrevealing and urine cx grew mbf. No further presyncope or syncope. Is in process of moving in to assisted living. Has h/o hypothyroidism with last tsh mildly elevated. 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   Past Medical History  Diagnosis Date  . Arthritis   . Chicken pox   . Chronic kidney disease   . Colon polyps   . Hypertension   . Dementia   . Angina   . Dysrhythmia   . Heart murmur   . Shortness of breath   . Recurrent upper respiratory infection (URI)   . Pneumonia   . Hypothyroidism   . Anemia   . Headache   . Anxiety   . Depression    Past Surgical History  Procedure Date  . Appendectomy   . Cholecystectomy   . Kidney stones   . Tonsillectomy   . Abdominal hysterectomy   . Hand surgery   . Feet surgery   . Shoulder arthroscopy   . Back surgery   . Tonsillectomy   . Eye surgery     cataract removed and eye lids lifted  . Fracture surgery     bilateral arms  . Tubal ligation     reports that she quit smoking about 70 years ago. Her smoking use included Cigarettes. She has a .2 pack-year smoking history. She has never used smokeless tobacco. She reports that she does not drink alcohol or use illicit drugs. family history includes Birth defects in her other and Heart disease in her father, mother, and other. Allergies  Allergen Reactions  . Codeine   . Hydrocodone      Review of Systems see hpi     Objective:   Physical Exam  Nursing note and vitals  reviewed. Constitutional: She appears well-developed and well-nourished. No distress.  HENT:  Head: Normocephalic and atraumatic.  Right Ear: External ear normal.  Left Ear: External ear normal.  Eyes: Conjunctivae are normal. No scleral icterus.  Neck: Neck supple.  Cardiovascular: Normal rate, regular rhythm and normal heart sounds.  Exam reveals no gallop and no friction rub.   No murmur heard. Pulmonary/Chest: Effort normal and breath sounds normal. No respiratory distress. She has no wheezes. She has no rales.  Neurological: She is alert.  Skin: Skin is warm and dry. She is not diaphoretic.  Psychiatric: She has a normal mood and affect.          Assessment & Plan:

## 2011-09-15 NOTE — Assessment & Plan Note (Signed)
Obtain tsh and free t4 

## 2011-09-15 NOTE — Assessment & Plan Note (Signed)
Asx. No further syncope. Recommend monitor bp. If develops dizziness or presyncope consider changing hctz

## 2011-09-17 ENCOUNTER — Telehealth: Payer: Self-pay | Admitting: *Deleted

## 2011-09-17 MED ORDER — BENZONATATE 100 MG PO CAPS: 100.0000 mg | ORAL_CAPSULE | Freq: Three times a day (TID) | ORAL | Status: DC | PRN

## 2011-09-17 NOTE — Telephone Encounter (Signed)
Call placed to patient at [phone], no answer. A detailed voice message was left informing patient per Dr Rodena Medin instructions. Rx sent to [facility].

## 2011-09-17 NOTE — Telephone Encounter (Signed)
Tessalon perles 100mg  po tid prn cough #30. Followup if no improvement or worsening.

## 2011-09-17 NOTE — Telephone Encounter (Signed)
Call placed to patient a regarding lab results; she was informed per Dr Rodena Medin instructions. She wanted to know if Dr Rodena Medin would provide a Rx for a cough. She stated she had sudden onset of productive cough clear in color, and states she has just about lost her voice. She stated that is taking robitussin, and that does not seem to be helping.

## 2011-09-18 ENCOUNTER — Telehealth: Payer: Self-pay | Admitting: *Deleted

## 2011-09-18 NOTE — Telephone Encounter (Signed)
Call placed to [phone], no answer. A detailed voice message was left informing Onalee Hua per Dr Rodena Medin instructions ok to proceed with physical therapy as stated in message.

## 2011-09-18 NOTE — Telephone Encounter (Signed)
ok 

## 2011-09-18 NOTE — Telephone Encounter (Signed)
Lorenda Hatchet Physical Therapist with [facility] called and left voice message stating he received a hospital referral for syncopal episodes. His message stated patient was evaluated in her home. He is requesting approval for plan of care for Home Health Physical Therapy for 2 times per week for 1 week, and then 3 times per week for 3 weeks for intentions of admitting to [facility]. He states patient has had several falls in the home. They will assess balance,endurance,safety, strength and fall prevention.

## 2011-09-24 ENCOUNTER — Telehealth: Payer: Self-pay | Admitting: *Deleted

## 2011-09-24 MED ORDER — AMOXICILLIN 500 MG PO CAPS: 500.0000 mg | ORAL_CAPSULE | Freq: Three times a day (TID) | ORAL | Status: AC

## 2011-09-24 MED ORDER — BENZONATATE 100 MG PO CAPS: 100.0000 mg | ORAL_CAPSULE | Freq: Three times a day (TID) | ORAL | Status: AC | PRN

## 2011-09-24 NOTE — Telephone Encounter (Signed)
amox 500mg  tid x7 if sx's haven't improved over total of 7+ days. Otherwise can't take tussionex which is why she gets tessalon prn

## 2011-09-24 NOTE — Telephone Encounter (Signed)
Call placed to patient at [phone], patient states she has had the cough for the past week. She has used over the counter Robitussin DM with some relief. Patient denies shortness of breath, she denies fever. She stated she is only bothered by the cough. She stated she does not use the [facility]; however she felt like she no longer needed the Rx for the [employer].

## 2011-09-24 NOTE — Telephone Encounter (Signed)
Call placed to patient at [phone], she was informed per Dr Rodena Medin instructions and has verbalized understanding to return for office visit if no improvement in symptoms. Rx's submitted to pharmacy.

## 2011-09-24 NOTE — Telephone Encounter (Signed)
Noreene Larsson with [facility] called and left voice message stating patient was seen today and was found to have a productive cough thick yellow in color. Her message stated patient denies having any shortness of breath. Her vitals were  Temp 98.1, pulse 78, respirations 18 and blood pressure 122/74. Noreene Larsson stated patient coughed constantly throughout the home visit,and she wanted to make Dr Rodena Medin aware of patients cough.

## 2011-09-24 NOTE — Telephone Encounter (Signed)
Could she give more information? Other sx's and duration

## 2011-10-01 ENCOUNTER — Telehealth: Payer: Self-pay | Admitting: Internal Medicine

## 2011-10-01 NOTE — Telephone Encounter (Signed)
Call placed to patient [phone], she is concerned that her son and daughter in law  Is telling everyone she has dementia, and she wanted to know if Dr Rodena Medin had informed them of this. She also wanted to know if it was documented in her chart. She was informed there was a discussion regarding memory loss, but nothing noted that I could see regarding dementia. Patient expressed that she is aware that she does forget things from time to time, however she stated that she does not believe she has dementia; and it stated that it is embarrassing for her family to go around telling something that very well may not be true.

## 2011-10-01 NOTE — Telephone Encounter (Signed)
Have had no discussions with her family without her because that is prohibited. She knows that. i have discussed her memory issues with her during visits. She probably should discuss her concerns about her family with her family and not assume i'm talking to them without her knowledge.

## 2011-10-01 NOTE — Telephone Encounter (Signed)
Patient would like to know if Dr. Rodena Medin has ever told her children if she has dementia?  She states that her children are telling everyone that she has dementia and are trying to sell some of her belongings.

## 2011-10-02 NOTE — Telephone Encounter (Signed)
Call placed to patient at [phone] she was informed per Dr Rodena Medin instructions and states she will discuss with family.

## 2011-10-06 ENCOUNTER — Telehealth: Payer: Self-pay | Admitting: Internal Medicine

## 2011-10-06 MED ORDER — LEVOTHYROXINE SODIUM 50 MCG PO TABS: 50.0000 ug | ORAL_TABLET | Freq: Every day | ORAL | Status: DC

## 2011-10-06 MED ORDER — HYDROCHLOROTHIAZIDE 12.5 MG PO CAPS: 25.0000 mg | ORAL_CAPSULE | Freq: Every day | ORAL | Status: DC

## 2011-10-06 NOTE — Telephone Encounter (Signed)
Call placed to patient at [phone], she was asked to verify the dose she is taking for her synthroid. She has verified that she is taking 50 mcg once a day. Rx refill sent to pharmacy.

## 2011-10-06 NOTE — Telephone Encounter (Signed)
Needs meds called in  [facility]   [address]    HCTZ 12.5 MG  Take 2 capsule by mouth daily, and  Levothyroxine   50 mcg tablet, take 2 by    Mouth  Daily

## 2011-10-08 ENCOUNTER — Telehealth: Payer: Self-pay | Admitting: Internal Medicine

## 2011-10-08 NOTE — Telephone Encounter (Signed)
ok 

## 2011-10-08 NOTE — Telephone Encounter (Signed)
Howell Rucks, physical Therapist said that he has had several missed visits with patient. She is in the process of moving. Her orders have run out this week and he would like to know if we could extend her orders for physical therapy out till next week for three more visits. That way he can make sure her home is safe for her.

## 2011-10-09 NOTE — Telephone Encounter (Signed)
Verbal Berkley Harvey given to Onalee Hua, Physical Therapist.

## 2011-10-17 ENCOUNTER — Telehealth: Payer: Self-pay | Admitting: *Deleted

## 2011-10-17 NOTE — Telephone Encounter (Signed)
noted 

## 2011-10-17 NOTE — Telephone Encounter (Signed)
Enrigue Catena a physical therapist with [facility] called and left voice message stating he was out to see patient for home physical therapy and he was informed by the patient that she decided she no longer require their services, and requested that he no longer comes back to her home.

## 2011-11-21 ENCOUNTER — Telehealth: Payer: Self-pay | Admitting: *Deleted

## 2011-11-21 NOTE — Telephone Encounter (Signed)
Call placed to patient at [phone], no answer. A voice message was left for patient to return phone call regarding paperwork that was received for back support from [facility].  Per Dr Rodena Medin he does not recommend this,and if patient would like to proceed with this; an office visit is needed for assessment.

## 2011-11-24 NOTE — Telephone Encounter (Signed)
Call placed to patient at [phone], she was informed per Dr Rodena Medin instructions. Patient stated that she does not recall initiating a request for a  back brace. She stated that it may have been something one of her daughter in laws have requested. She was advised if she wishes to proceed with back brace an office visit is needed per Dr Rodena Medin.  Patient has verbalized understanding and agrees as instructed.

## 2011-12-04 IMAGING — CR DG CHEST 2V
2 series · 2 of 2 positions shown · non-contrast
Comparison: 08/03/2009

CLINICAL DATA: Cough.  Congestion.  Short of breath.  Chest pain.

CHEST - 2 VIEW

[w chest pa]
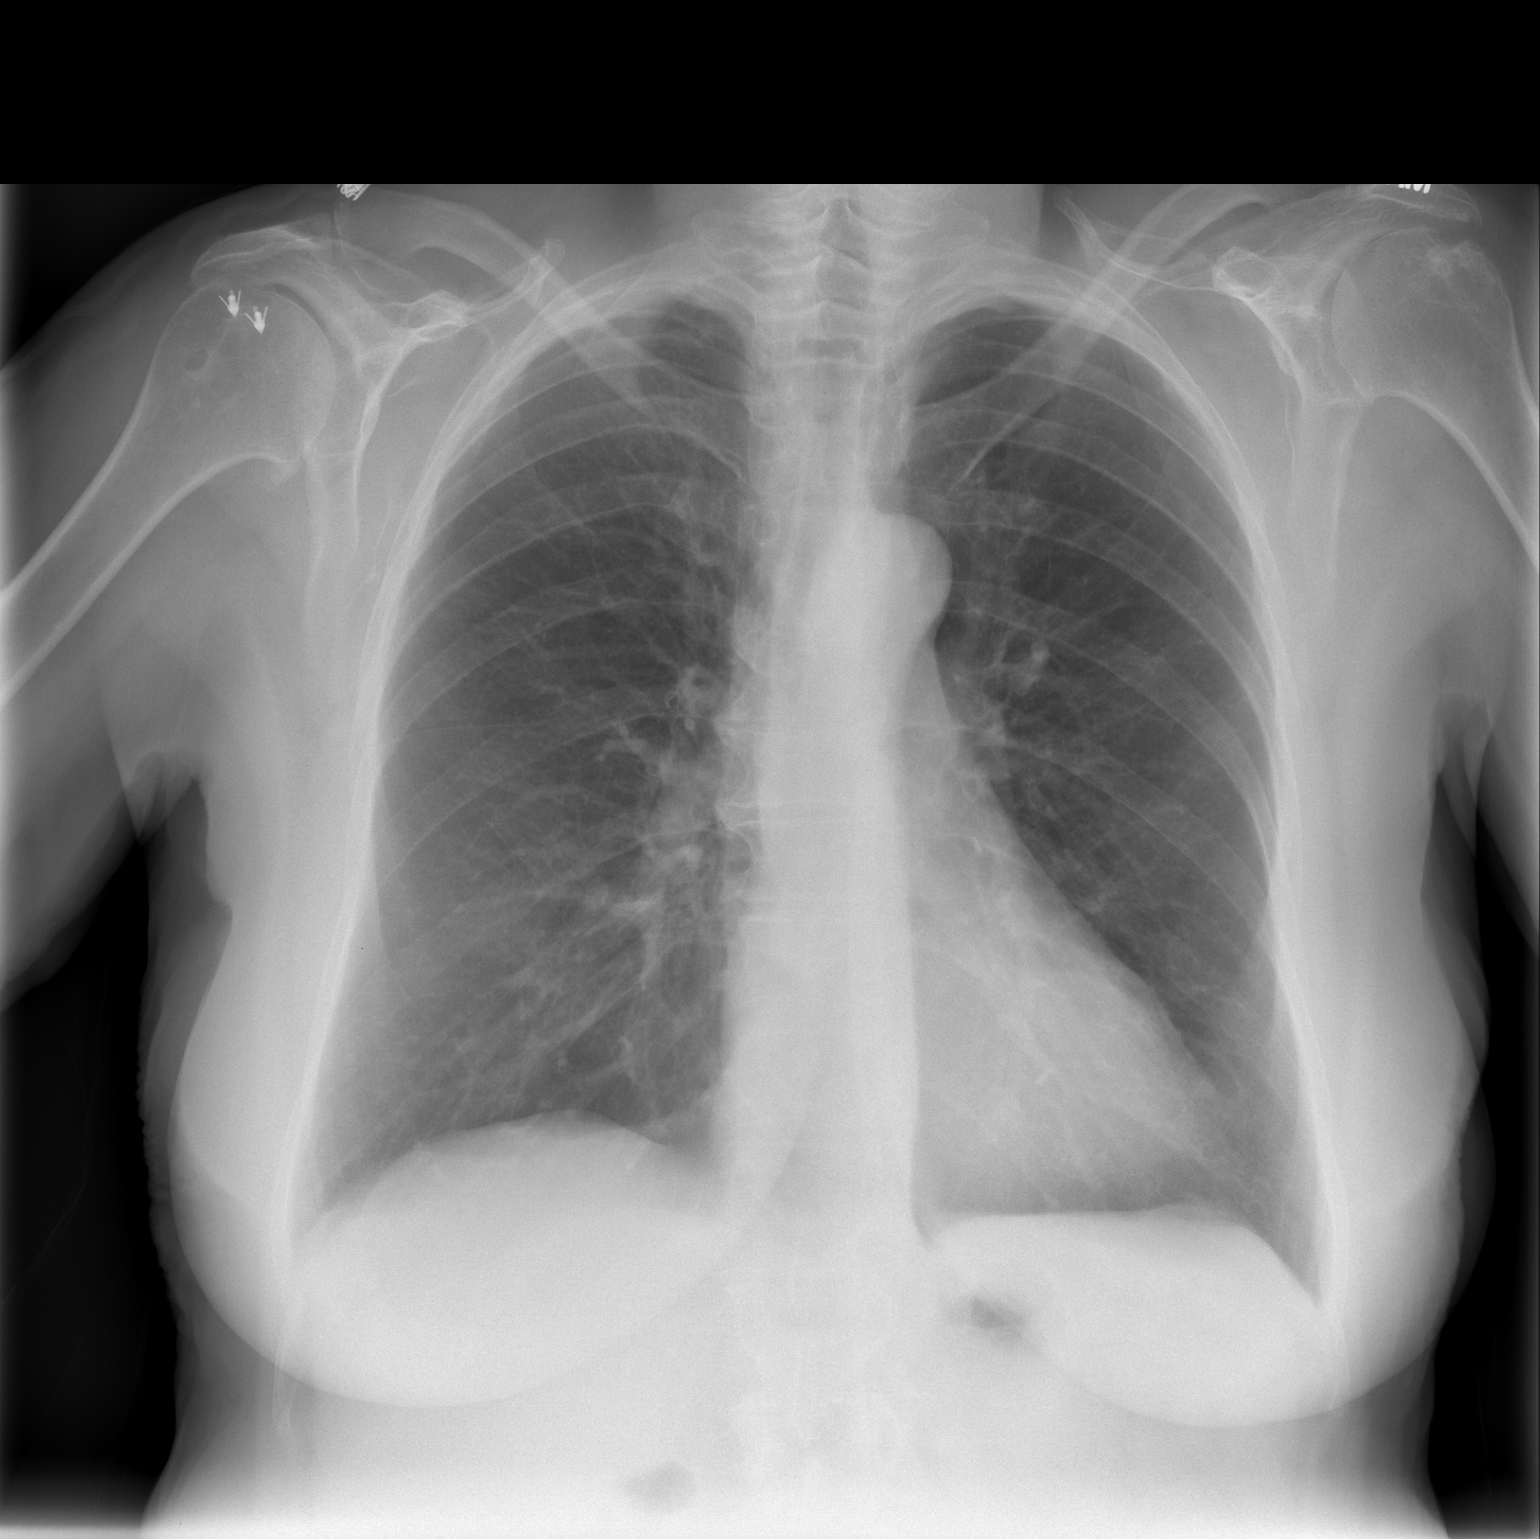

[w chest lat]
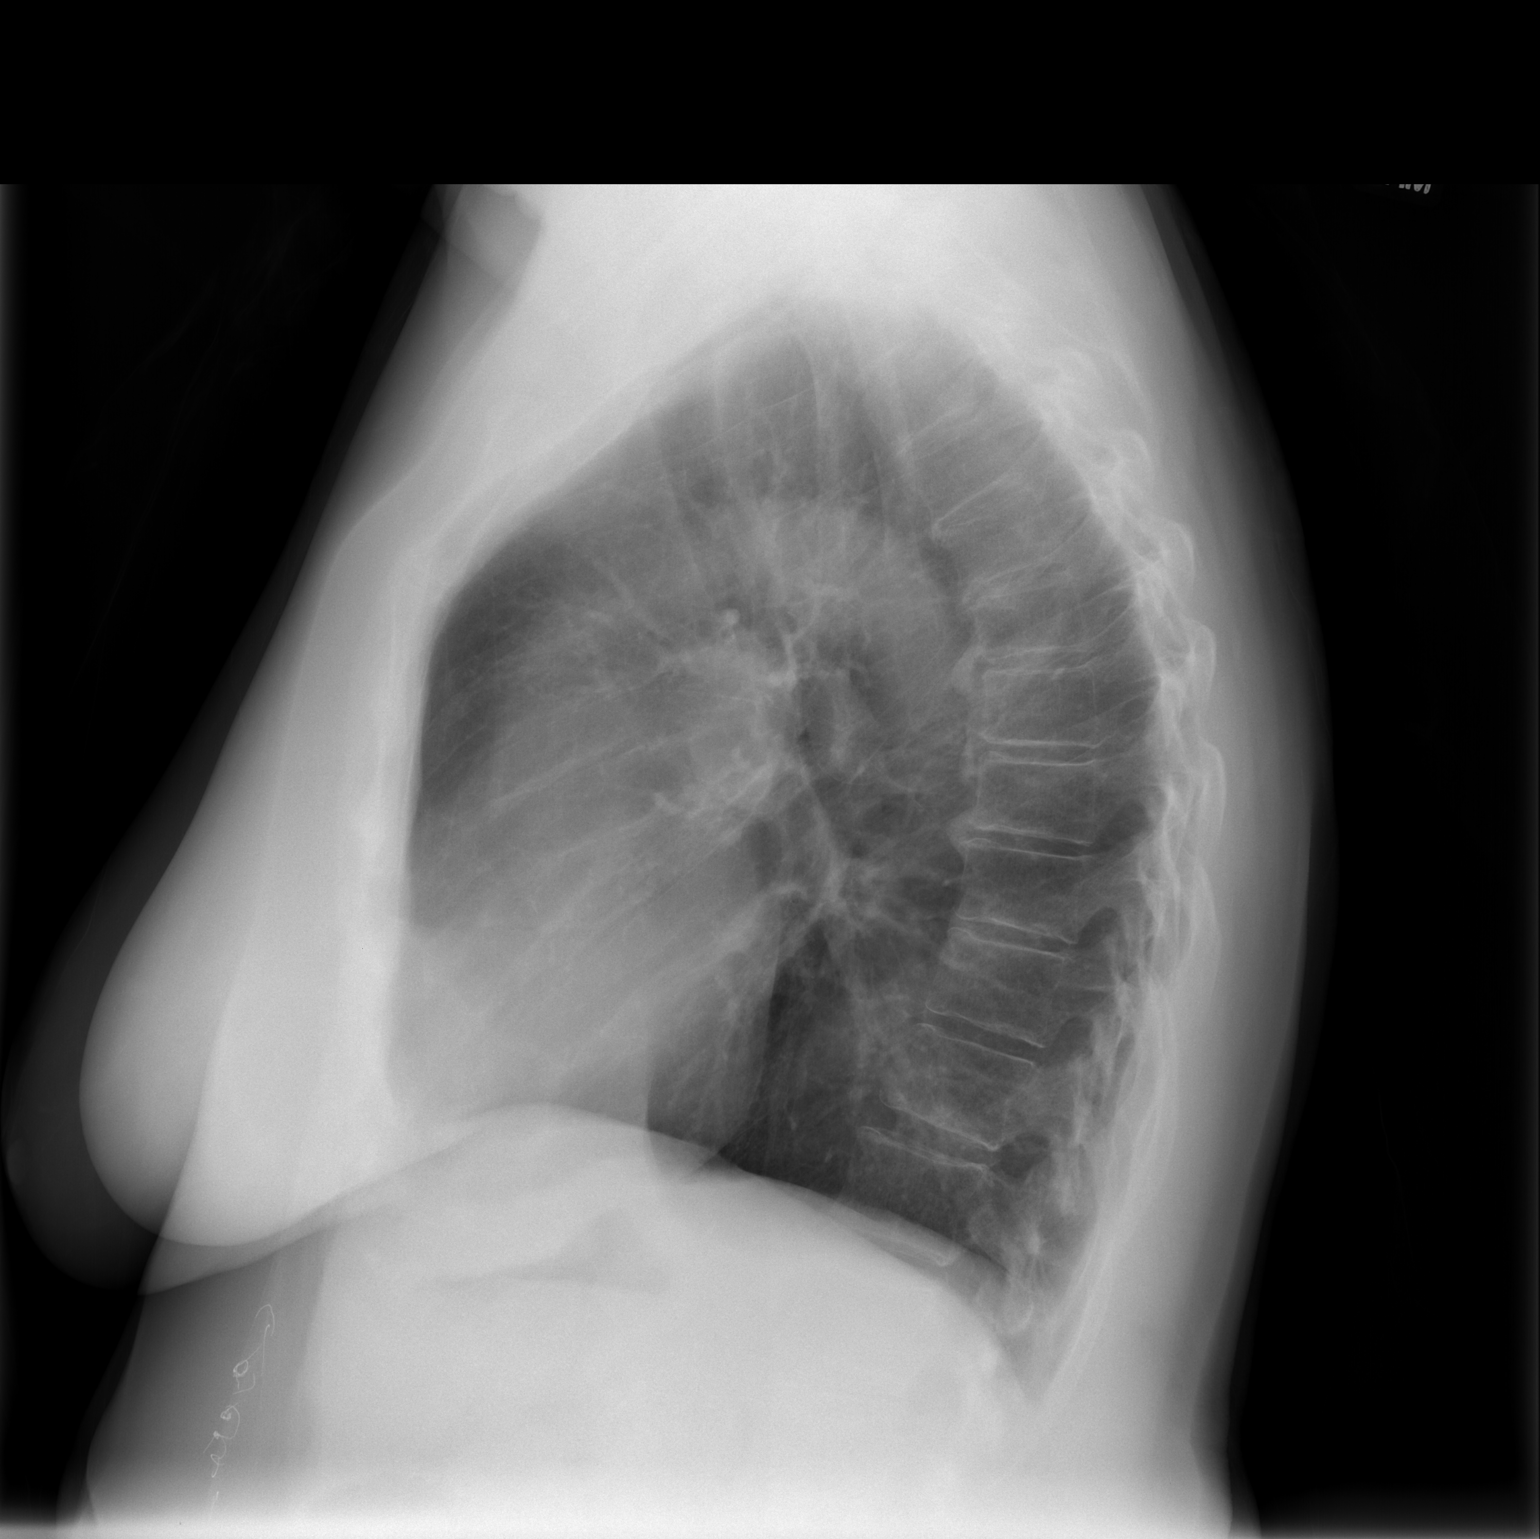

[2 of 2 positions shown; findings below may reference images not displayed]

FINDINGS: Heart size is normal.  Mediastinal shadows are normal.
The lungs are clear.  No effusions.  Ordinary degenerative changes
effect the spine and shoulders.
IMPRESSION: No active disease

## 2011-12-17 ENCOUNTER — Ambulatory Visit (INDEPENDENT_AMBULATORY_CARE_PROVIDER_SITE_OTHER): Payer: Medicare Other | Admitting: Internal Medicine

## 2011-12-17 ENCOUNTER — Encounter: Payer: Self-pay | Admitting: Internal Medicine

## 2011-12-17 VITALS — BP 160/74 | HR 64 | Temp 98.0°F | Resp 16 | Wt 169.2 lb

## 2011-12-17 DIAGNOSIS — I10 Essential (primary) hypertension: Secondary | ICD-10-CM

## 2011-12-17 DIAGNOSIS — E039 Hypothyroidism, unspecified: Secondary | ICD-10-CM

## 2011-12-17 DIAGNOSIS — M79609 Pain in unspecified limb: Secondary | ICD-10-CM

## 2011-12-17 DIAGNOSIS — M79606 Pain in leg, unspecified: Secondary | ICD-10-CM

## 2011-12-17 NOTE — Progress Notes (Signed)
  Subjective:    Patient ID: [name], female    DOB: 09-15-1920, 78 y.o.   MRN: [id number]  HPI Pt presents to clinic for followup of multiple medical problems. Has chronic intermittent primarily nocturnal left leg cramping. Left leg was previously affected by polio. Not taking any medication for the problem. Bp initially elevated but rechecked to be 132/68. Has mild intermittent dizziness without syncope. Does not occur frequently.   Past Medical History  Diagnosis Date  . Arthritis   . Chicken pox   . Chronic kidney disease   . Colon polyps   . Hypertension   . Dementia   . Angina   . Dysrhythmia   . Heart murmur   . Shortness of breath   . Recurrent upper respiratory infection (URI)   . Pneumonia   . Hypothyroidism   . Anemia   . Headache   . Anxiety   . Depression    Past Surgical History  Procedure Date  . Appendectomy   . Cholecystectomy   . Kidney stones   . Tonsillectomy   . Abdominal hysterectomy   . Hand surgery   . Feet surgery   . Shoulder arthroscopy   . Back surgery   . Tonsillectomy   . Eye surgery     cataract removed and eye lids lifted  . Fracture surgery     bilateral arms  . Tubal ligation     reports that she quit smoking about 70 years ago. Her smoking use included Cigarettes. She has a .2 pack-year smoking history. She has never used smokeless tobacco. She reports that she does not drink alcohol or use illicit drugs. family history includes Birth defects in her other and Heart disease in her father, mother, and other. Allergies  Allergen Reactions  . Codeine   . Hydrocodone       Review of Systems see hpi     Objective:   Physical Exam  Nursing note and vitals reviewed. Constitutional: She appears well-developed and well-nourished. No distress.  HENT:  Head: Normocephalic and atraumatic.  Right Ear: External ear normal.  Left Ear: External ear normal.  Eyes: Conjunctivae are normal. No scleral icterus.  Neck: Neck supple.    Cardiovascular: Normal rate, regular rhythm and normal heart sounds.  Exam reveals no gallop and no friction rub.   No murmur heard. Pulmonary/Chest: Effort normal and breath sounds normal. No respiratory distress. She has no wheezes. She has no rales.  Neurological: She is alert.  Skin: Skin is warm and dry. She is not diaphoretic.  Psychiatric: She has a normal mood and affect.          Assessment & Plan:

## 2011-12-17 NOTE — Assessment & Plan Note (Signed)
Stable. Obtain tsh/free t4 prior to next visit 

## 2011-12-17 NOTE — Assessment & Plan Note (Signed)
Attempt mag oxide qhs.

## 2011-12-17 NOTE — Assessment & Plan Note (Signed)
Isolated elevation. Recommend outpt bp log 

## 2011-12-23 IMAGING — CT CT ABD-PELV W/O CM
2 of 4 series · 17 of 46 positions shown, 19 images · non-contrast
Comparison: 10/23/2009

CLINICAL DATA: Abdominal pain.  Flank pain and nausea.  History of
kidney stones.  Left flank pain.  History of appendicitis,
cholecystectomy, hysterectomy.

CT ABDOMEN AND PELVIS WITHOUT CONTRAST
TECHNIQUE: Multidetector CT imaging of the abdomen and pelvis was
performed following the standard protocol without intravenous
contrast.

[Series 2: stone_wo 5.0 b40f st · axial · 0.75mm/px · z∈[-469,-64]mm · 14 of 89 slices shown, 16 images]
[im 4/89  soft-tissue]
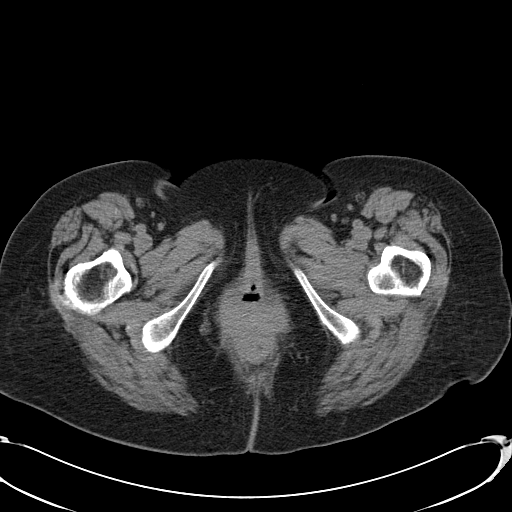
[im 4/89  bone]
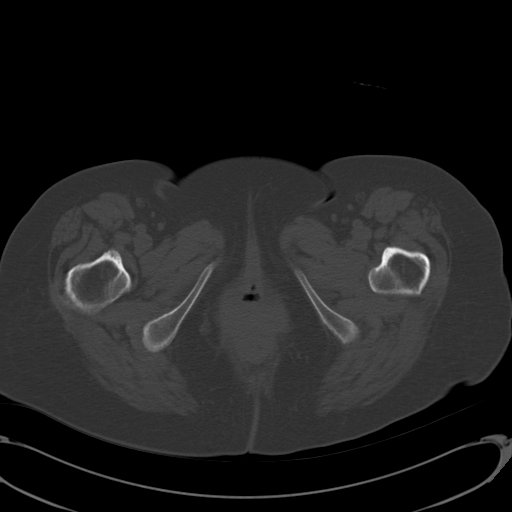
[im 11/89  soft-tissue]
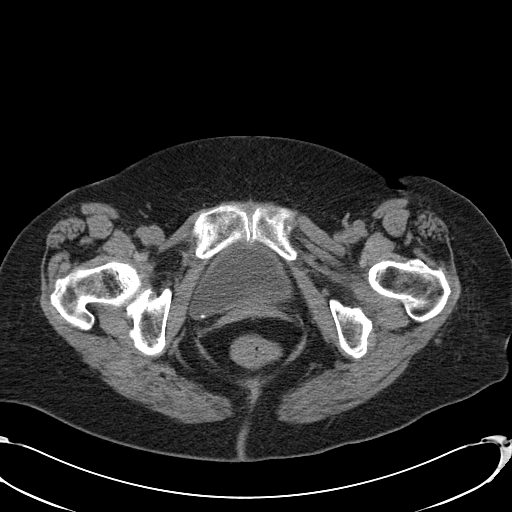
[im 18/89  soft-tissue]
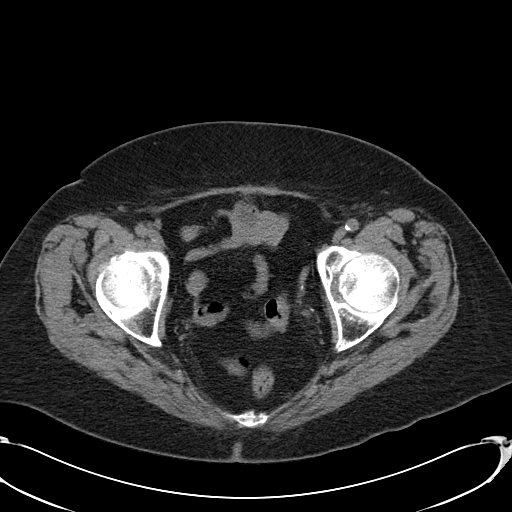
[im 25/89  soft-tissue]
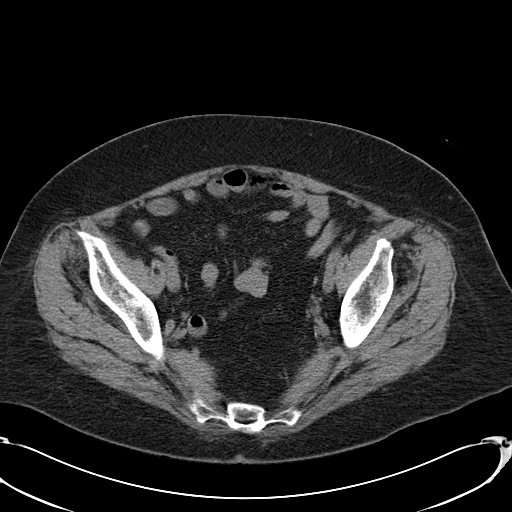
[im 29/89  soft-tissue]
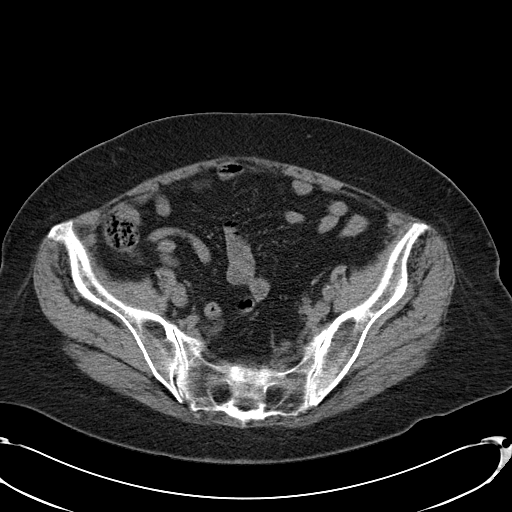
[im 36/89  soft-tissue]
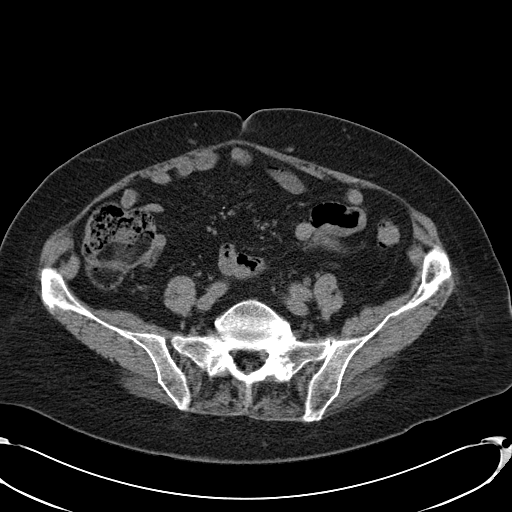
[im 43/89  soft-tissue]
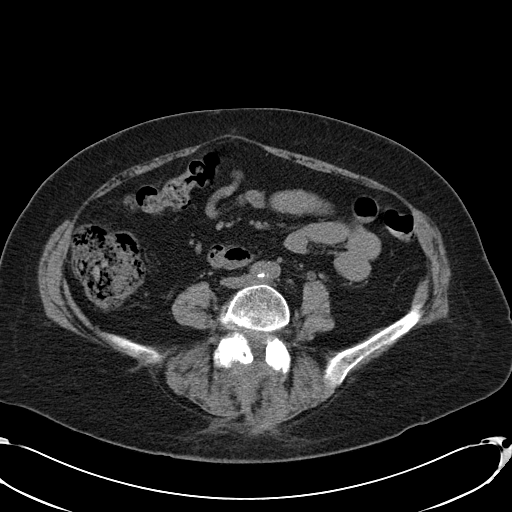
[im 46/89  soft-tissue]
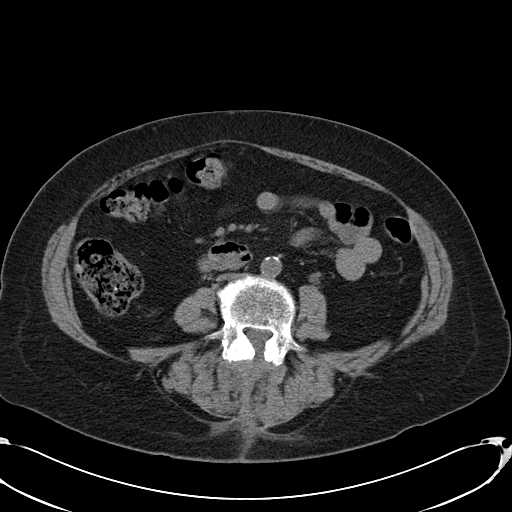
[im 53/89  soft-tissue]
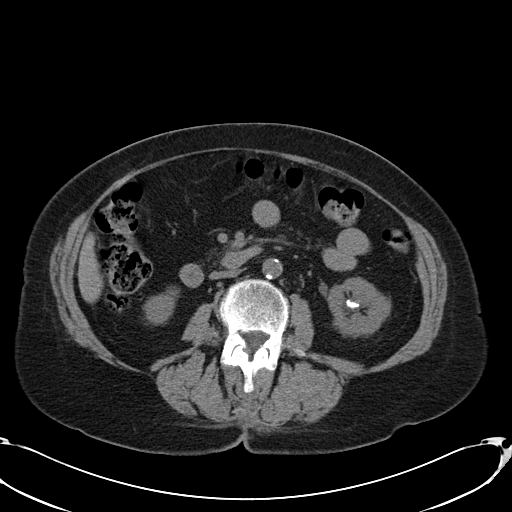
[im 53/89  bone]
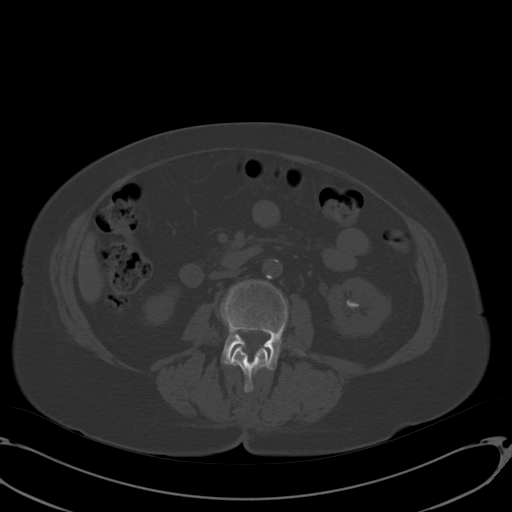
[im 60/89  soft-tissue]
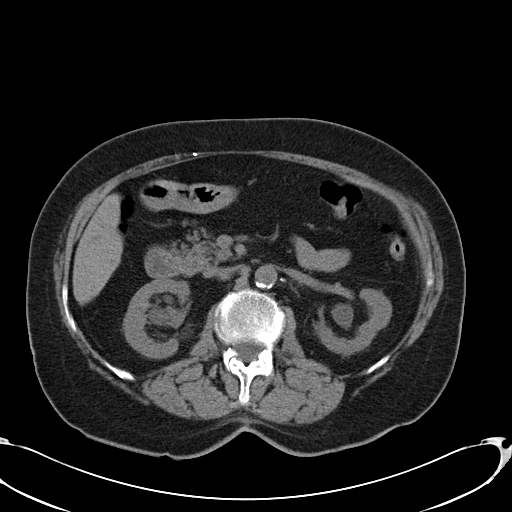
[im 67/89  soft-tissue]
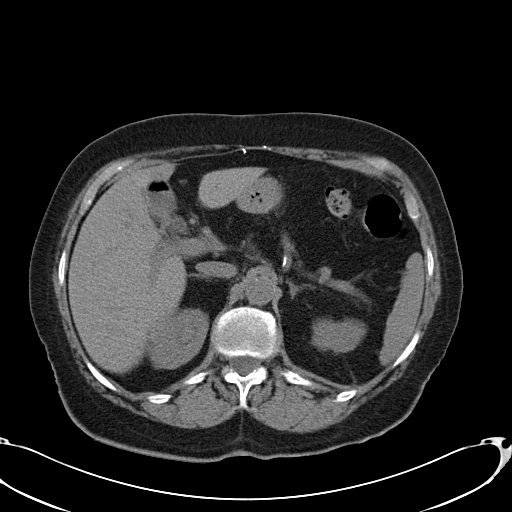
[im 71/89  soft-tissue]
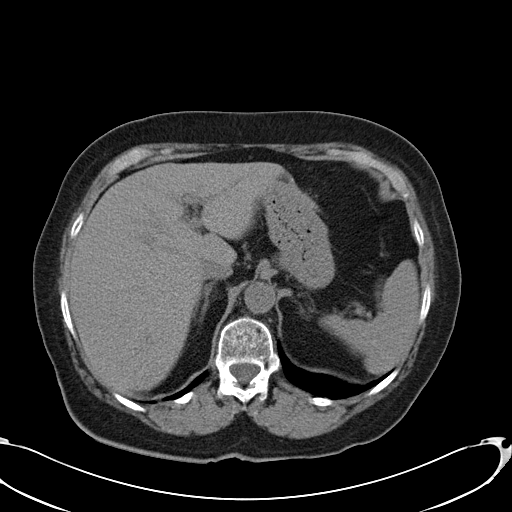
[im 78/89  soft-tissue]
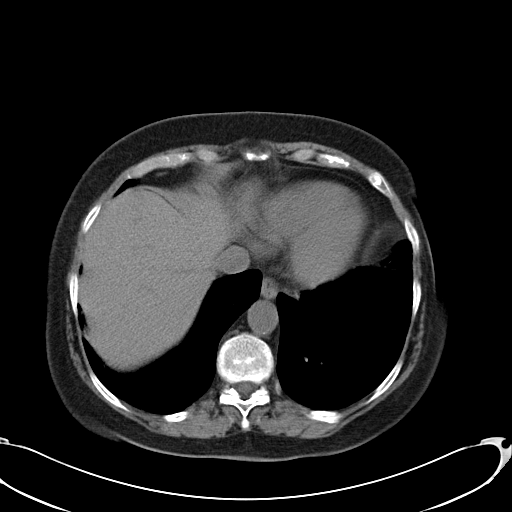
[im 85/89  soft-tissue]
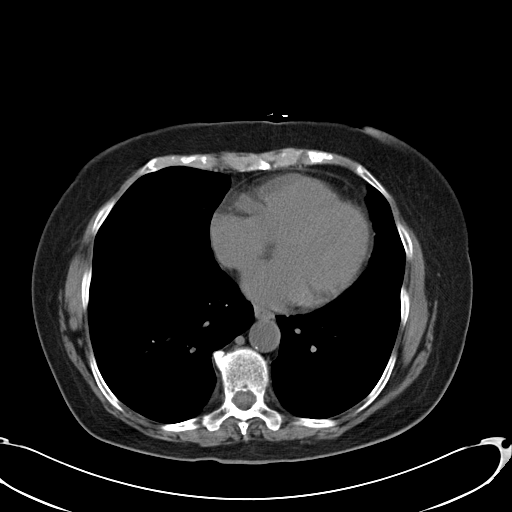

[Series 602: coronal · coronal · 0.91mm/px · 3 of 120 slices shown]
[im 40/120  soft-tissue]
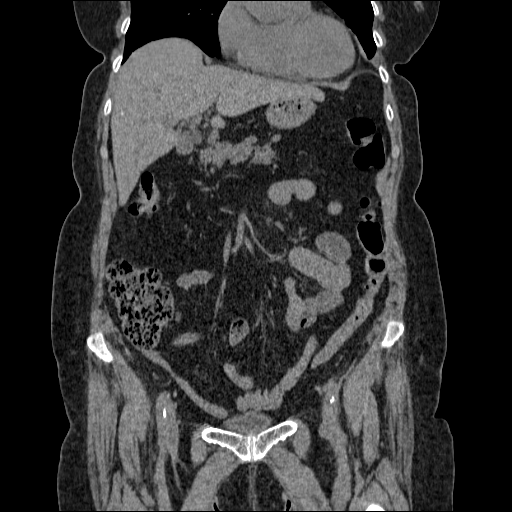
[im 53/120  soft-tissue]
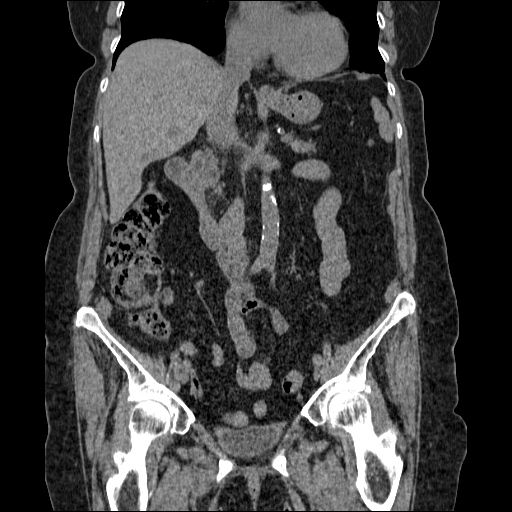
[im 67/120  soft-tissue]
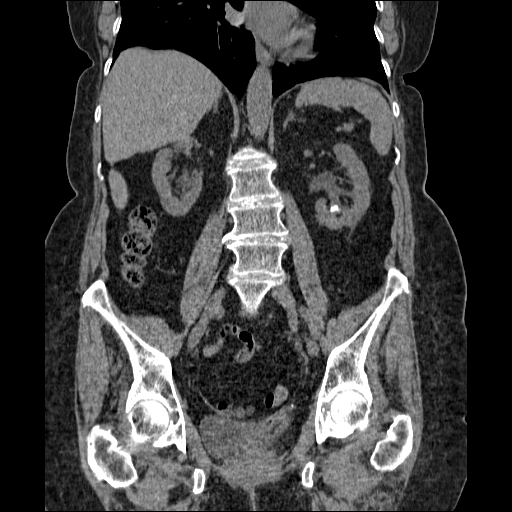

[17 of 46 positions shown; findings below may reference images not displayed]

FINDINGS: The lung bases are unremarkable.  There is mild
prominence of the left ureter throughout its course, associated
mild peri ureteral stranding.  There is bilateral perinephric
stranding, left greater than right.  An intrarenal calculus on the
left measures 11 mm in diameter.  Within the dependent, right
aspect of the bladder, there is a 4 mm calculus.  This is remote
from the right ureteral vesicle junction.  This may represent a
calculus recently passed from the left ureter given the secondary
signs of obstruction on the left.  The right ureter has a normal
appearance.  No intrarenal calculi identified on the right.  Again
noted are parapelvic cysts on the right.

There are scattered hepatic granuloma.  No other focal liver
lesions identified.  No focal abnormality identified within the
spleen, pancreas, adrenal glands.  The gallbladder is surgically
absent.  The appendix is surgically absent.  Visualized bowel loops
are normal in caliber and wall thickness.

No retroperitoneal or mesenteric adenopathy. No evidence for aortic
aneurysm.  The uterus is surgically absent.  No free pelvic fluid
or adnexal mass.  Mild degenerative changes are seen in the spine.
There is anterolisthesis of L4 on L5, grade 1.
IMPRESSION: 1.  Mild left hydronephrosis, hydroureter and perinephric
stranding.
2.  Small calculus within the bladder, favoring a stone recently
passed from the left collecting system.
3.  Postoperative changes.

## 2012-01-12 ENCOUNTER — Telehealth: Payer: Self-pay | Admitting: *Deleted

## 2012-01-12 NOTE — Telephone Encounter (Signed)
LMOM with contact name & number for return call to verify that pt requested order for Lumbar Belt from [facility] from [place], [place] before completing fax request to fill out with pt's medical information/SLS

## 2012-01-14 ENCOUNTER — Encounter: Payer: Self-pay | Admitting: Family

## 2012-01-14 ENCOUNTER — Ambulatory Visit (INDEPENDENT_AMBULATORY_CARE_PROVIDER_SITE_OTHER): Payer: Medicare Other | Admitting: Family

## 2012-01-14 ENCOUNTER — Ambulatory Visit (HOSPITAL_BASED_OUTPATIENT_CLINIC_OR_DEPARTMENT_OTHER)
Admission: RE | Admit: 2012-01-14 | Discharge: 2012-01-14 | Disposition: A | Discharge status: Home / Self Care | Payer: Medicare Other | Source: Ambulatory Visit | Attending: Family | Admitting: Family

## 2012-01-14 VITALS — BP 124/70 | HR 74 | Temp 97.6°F | Resp 16 | Ht 65.0 in | Wt 175.0 lb

## 2012-01-14 DIAGNOSIS — M25552 Pain in left hip: Secondary | ICD-10-CM

## 2012-01-14 DIAGNOSIS — M949 Disorder of cartilage, unspecified: Secondary | ICD-10-CM | POA: Insufficient documentation

## 2012-01-14 DIAGNOSIS — M25559 Pain in unspecified hip: Secondary | ICD-10-CM | POA: Insufficient documentation

## 2012-01-14 DIAGNOSIS — M899 Disorder of bone, unspecified: Secondary | ICD-10-CM | POA: Insufficient documentation

## 2012-01-14 NOTE — Patient Instructions (Addendum)
Please complete x-ray of the left hip on the first floor.

## 2012-01-14 NOTE — Progress Notes (Signed)
Subjective:    Patient ID: [name], female    DOB: 1921-04-26, 78 y.o.   MRN: [id number]  HPI  [name] is a pleasant 78 yr old female who presents today with chief complaint of left hip pain.  Pain started 2 weeks ago following an accidental fall.  She reports pain is worse with walking and improved by nothing.  She has not tried any medications for pain.  Review of Systems See HPI  Past Medical History  Diagnosis Date  . Arthritis   . Chicken pox   . Chronic kidney disease   . Colon polyps   . Hypertension   . Dementia   . Angina   . Dysrhythmia   . Heart murmur   . Shortness of breath   . Recurrent upper respiratory infection (URI)   . Pneumonia   . Hypothyroidism   . Anemia   . Headache   . Anxiety   . Depression     History   Social History  . Marital Status: Widowed    Spouse Name: N/A    Number of Children: N/A  . Years of Education: N/A   Occupational History  . Not on file.   Social History Main Topics  . Smoking status: Former Smoker -- 0.2 packs/day for 1 years    Types: Cigarettes    Quit date: 06/02/1941  . Smokeless tobacco: Never Used  . Alcohol Use: No  . Drug Use: No  . Sexually Active: Not Currently    Birth Control/ Protection: Abstinence   Other Topics Concern  . Not on file   Social History Narrative   Still lives at home and ambulatory daily. Doesn't use cane or walker, but has cane at home. Remote smoking history.     Past Surgical History  Procedure Date  . Appendectomy   . Cholecystectomy   . Kidney stones   . Tonsillectomy   . Abdominal hysterectomy   . Hand surgery   . Feet surgery   . Shoulder arthroscopy   . Back surgery   . Tonsillectomy   . Eye surgery     cataract removed and eye lids lifted  . Fracture surgery     bilateral arms  . Tubal ligation     Family History  Problem Relation Age of Onset  . Heart disease Mother   . Heart disease Father   . Heart disease Other   . Birth defects Other      Allergies  Allergen Reactions  . Codeine   . Hydrocodone     Current Outpatient Prescriptions on File Prior to Visit  Medication Sig Dispense Refill  . hydrochlorothiazide (MICROZIDE) 12.5 MG capsule Take 2 capsules (25 mg total) by mouth daily.  60 capsule  3  . levothyroxine (SYNTHROID, LEVOTHROID) 50 MCG tablet Take 1 tablet (50 mcg total) by mouth daily.  30 tablet  3  . LORazepam (ATIVAN) 1 MG tablet Take 1 tablet (1 mg total) by mouth at bedtime as needed. For anxiety/sleep.  30 tablet  0    BP 124/70  Pulse 74  Temp 97.6 F (36.4 C) (Oral)  Resp 16  Ht 5\' 5"  (1.651 m)  Wt 175 lb (79.379 kg)  BMI 29.12 kg/m2  SpO2 97%       Objective:   Physical Exam  Constitutional: She appears well-developed and well-nourished. No distress.  Cardiovascular: Normal rate and regular rhythm.   No murmur heard. Pulmonary/Chest: Effort normal and breath sounds normal. No  respiratory distress. She has no wheezes. She has no rales. She exhibits no tenderness.  Musculoskeletal:       Full ROM of left hip.  Pain is worst with abduction/adduction.  Psychiatric: She has a normal mood and affect. Her behavior is normal. Judgment and thought content normal.          Assessment & Plan:

## 2012-01-15 DIAGNOSIS — M25552 Pain in left hip: Secondary | ICD-10-CM | POA: Insufficient documentation

## 2012-01-15 IMAGING — CR DG CHEST 2V
2 series · 2 of 2 positions shown · non-contrast
Comparison: 09/16/2010

CLINICAL DATA: Cough, weakness, chest pain and shortness of
breath.

CHEST - 2 VIEW

[w chest pa]
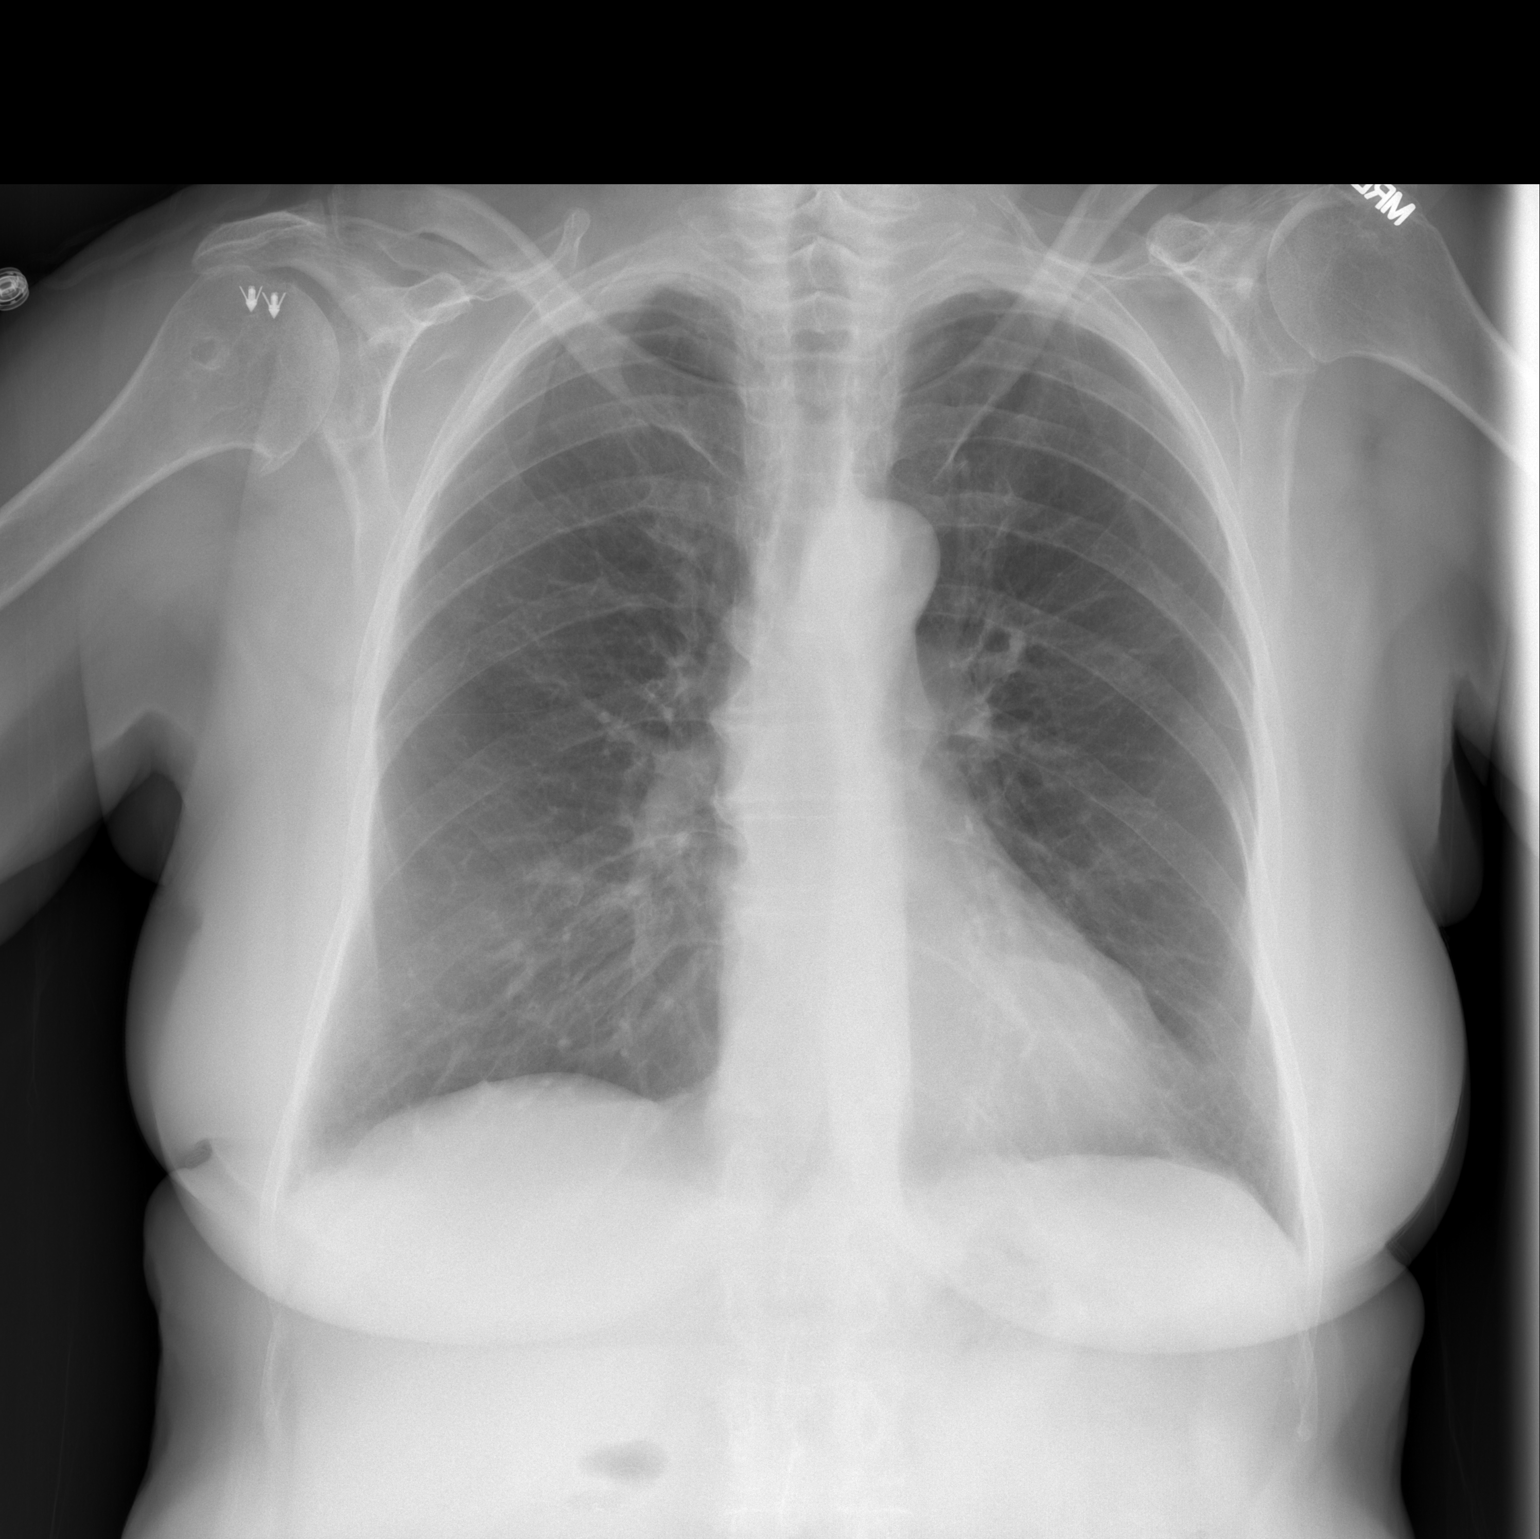

[w chest lat]
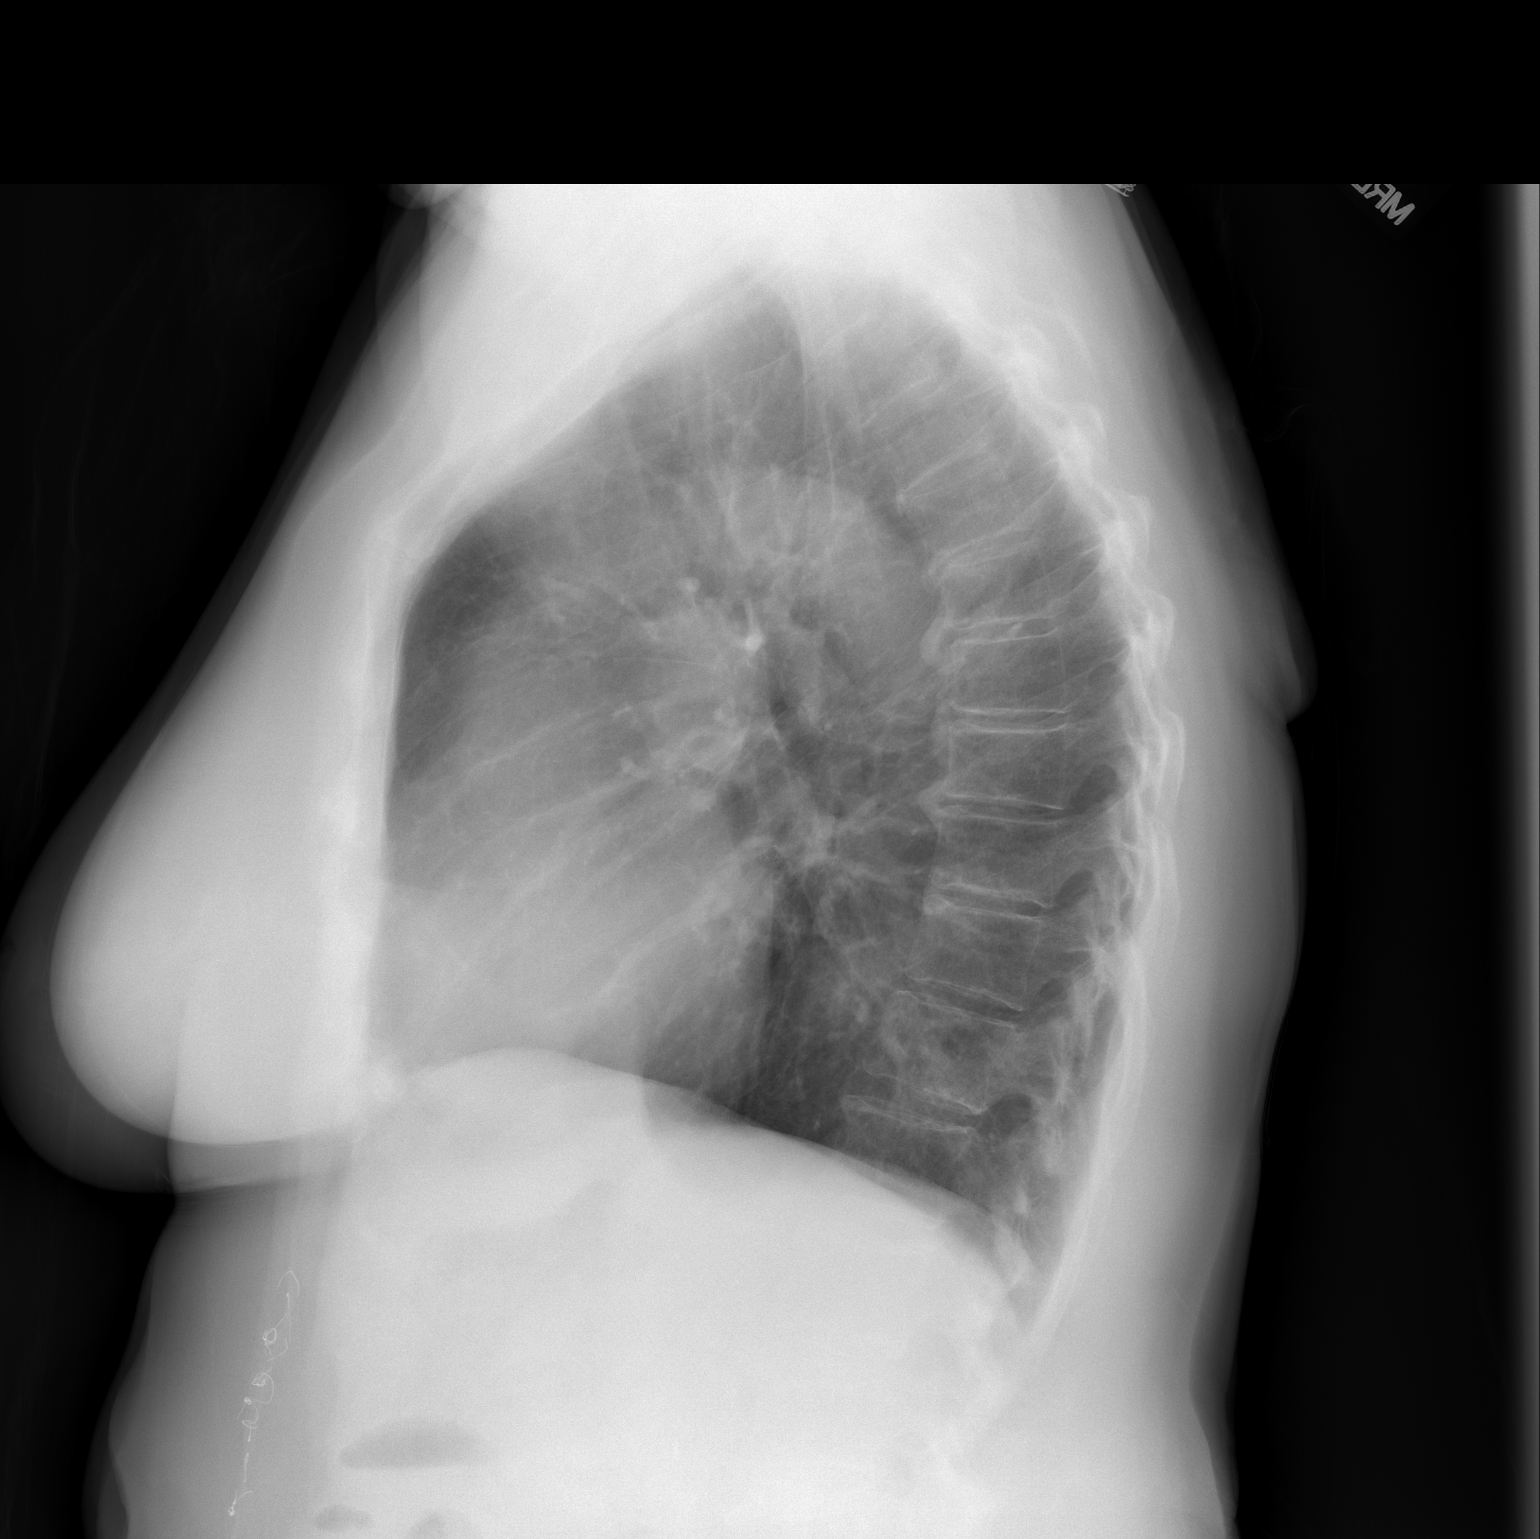

[2 of 2 positions shown; findings below may reference images not displayed]

FINDINGS: The heart size and mediastinal contours are within normal
limits.  Both lungs are clear.  The visualized skeletal structures
are unremarkable.
IMPRESSION: No active disease.

## 2012-01-15 NOTE — Assessment & Plan Note (Signed)
Hip x-ray is performed and is negative for fracture.  I have advised pt to contact us if pain does not continue to improve.  MRI for occult fracture may be a consideration if pain does not improve.  She is able to ambulate. Recommended tylenol prn.

## 2012-02-24 ENCOUNTER — Other Ambulatory Visit: Payer: Self-pay | Admitting: Internal Medicine

## 2012-02-24 ENCOUNTER — Telehealth: Payer: Self-pay | Admitting: Internal Medicine

## 2012-02-24 NOTE — Telephone Encounter (Signed)
Done/SLS 

## 2012-02-24 NOTE — Telephone Encounter (Signed)
Done per [facility] request 09.24.13/SLS

## 2012-03-08 ENCOUNTER — Ambulatory Visit: Payer: Medicare Other | Admitting: Internal Medicine

## 2012-03-09 ENCOUNTER — Encounter: Payer: Self-pay | Admitting: Internal Medicine

## 2012-03-09 ENCOUNTER — Ambulatory Visit (INDEPENDENT_AMBULATORY_CARE_PROVIDER_SITE_OTHER): Payer: Medicare Other | Admitting: Internal Medicine

## 2012-03-09 VITALS — BP 134/82 | HR 63 | Temp 97.3°F | Wt 177.4 lb

## 2012-03-09 DIAGNOSIS — E039 Hypothyroidism, unspecified: Secondary | ICD-10-CM

## 2012-03-09 DIAGNOSIS — R195 Other fecal abnormalities: Secondary | ICD-10-CM

## 2012-03-09 DIAGNOSIS — Z79899 Other long term (current) drug therapy: Secondary | ICD-10-CM

## 2012-03-09 DIAGNOSIS — R252 Cramp and spasm: Secondary | ICD-10-CM

## 2012-03-09 DIAGNOSIS — I10 Essential (primary) hypertension: Secondary | ICD-10-CM

## 2012-03-09 LAB — CBC WITH DIFFERENTIAL/PLATELET
Basophils Absolute: 0 10*3/uL (ref 0.0–0.1)
Eosinophils Relative: 1 % (ref 0–5)
Lymphocytes Relative: 25 % (ref 12–46)
Lymphs Abs: 2.6 10*3/uL (ref 0.7–4.0)
MCV: 83.8 fL (ref 78.0–100.0)
Neutrophils Relative %: 69 % (ref 43–77)
Platelets: 309 10*3/uL (ref 150–400)
RBC: 4.51 MIL/uL (ref 3.87–5.11)
RDW: 13.2 % (ref 11.5–15.5)
WBC: 10.4 10*3/uL (ref 4.0–10.5)

## 2012-03-09 LAB — T4, FREE: Free T4: 1.21 ng/dL (ref 0.80–1.80)

## 2012-03-09 LAB — BASIC METABOLIC PANEL
Calcium: 9.8 mg/dL (ref 8.4–10.5)
Chloride: 100 mEq/L (ref 96–112)
Creat: 0.95 mg/dL (ref 0.50–1.10)
Sodium: 141 mEq/L (ref 135–145)

## 2012-03-10 DIAGNOSIS — R252 Cramp and spasm: Secondary | ICD-10-CM | POA: Insufficient documentation

## 2012-03-10 DIAGNOSIS — R195 Other fecal abnormalities: Secondary | ICD-10-CM | POA: Insufficient documentation

## 2012-03-10 NOTE — Assessment & Plan Note (Signed)
Provided with Hemoccult cards x3 to complete and return. Detailed instructions provided

## 2012-03-10 NOTE — Progress Notes (Signed)
  Subjective:    Patient ID: [name], female    DOB: May 04, 1921, 78 y.o.   MRN: [id number]  HPI patient presents to clinic for evaluation and was transferred left rib and right calf muscle cramping predominantly at night. Attempted a compression sleeve which resolved the cramps one night. Has taken magnesium oxide but not consistently. No other alleviating or exacerbating factors. Also notes incidentally recent dark? Black stools without blood. Denies abdominal pain, hematemesis or hematochezia. Declines influenza vaccine  Past Medical History  Diagnosis Date  . Arthritis   . Chicken pox   . Chronic kidney disease   . Colon polyps   . Hypertension   . Dementia   . Angina   . Dysrhythmia   . Heart murmur   . Shortness of breath   . Recurrent upper respiratory infection (URI)   . Pneumonia   . Hypothyroidism   . Anemia   . Headache   . Anxiety   . Depression    Past Surgical History  Procedure Date  . Appendectomy   . Cholecystectomy   . Kidney stones   . Tonsillectomy   . Abdominal hysterectomy   . Hand surgery   . Feet surgery   . Shoulder arthroscopy   . Back surgery   . Tonsillectomy   . Eye surgery     cataract removed and eye lids lifted  . Fracture surgery     bilateral arms  . Tubal ligation     reports that she quit smoking about 70 years ago. Her smoking use included Cigarettes. She has a .2 pack-year smoking history. She has never used smokeless tobacco. She reports that she does not drink alcohol or use illicit drugs. family history includes Birth defects in her other and Heart disease in her father, mother, and other. Allergies  Allergen Reactions  . Codeine   . Hydrocodone      Review of Systems see hpi     Objective:   Physical Exam  Nursing note and vitals reviewed. Constitutional: She appears well-developed and well-nourished. No distress.  HENT:  Head: Normocephalic and atraumatic.  Musculoskeletal:       Lower extremities: No calf  tenderness overt spasm or palpable cords. muscle tone normal  Neurological: She is alert.  Skin: Skin is warm and dry. No rash noted. She is not diaphoretic. No erythema.  Psychiatric: She has a normal mood and affect.          Assessment & Plan:

## 2012-03-10 NOTE — Assessment & Plan Note (Signed)
Obtain TSH and free T4 

## 2012-03-10 NOTE — Assessment & Plan Note (Signed)
Obtain Chem-7. Take magnesium daily. Okay to wear compression hose

## 2012-03-17 ENCOUNTER — Ambulatory Visit: Payer: Medicare Other | Admitting: Internal Medicine

## 2012-03-22 ENCOUNTER — Other Ambulatory Visit (INDEPENDENT_AMBULATORY_CARE_PROVIDER_SITE_OTHER): Payer: Medicare Other | Admitting: *Deleted

## 2012-03-22 DIAGNOSIS — R195 Other fecal abnormalities: Secondary | ICD-10-CM

## 2012-03-22 LAB — HEMOCCULT GUIAC POC 1CARD (OFFICE): Fecal Occult Blood, POC: POSITIVE

## 2012-03-24 NOTE — Progress Notes (Signed)
LMOM with contact name & number for return call RE: results & further MD instructions/SLS 10.23.13 Left 2nd message on VM for return call/SLS 10.25.13 After 3rd attempt to reach pt via telephone, letter mailed requesting pt to contact office/SLS [phone]

## 2012-03-24 NOTE — Progress Notes (Signed)
Stools heme + with recent dark stools. Recommend gi consult

## 2012-03-30 ENCOUNTER — Encounter: Payer: Self-pay | Admitting: *Deleted

## 2012-05-14 IMAGING — CT CT ANGIO CHEST
1 of 2 series · 19 of 32 positions shown · IV contrast (APPLIED)
Comparison: Chest radiograph 02/24/2011

CLINICAL DATA: Left chest pain, shortness of breath, and cough.

CT ANGIOGRAPHY CHEST WITH CONTRAST
TECHNIQUE: Multidetector CT imaging of the chest was performed
using the standard protocol during bolus administration of
intravenous contrast.  Multiplanar CT image reconstructions
including MIPs were obtained to evaluate the vascular anatomy.
Contrast:  80 ml 5mnipaque-ZAA

[Series 9: thins for pacs · axial · 0.55mm/px · z∈[+852,+1052]mm · 19 of 224 slices shown]
[im 12/224  lung]
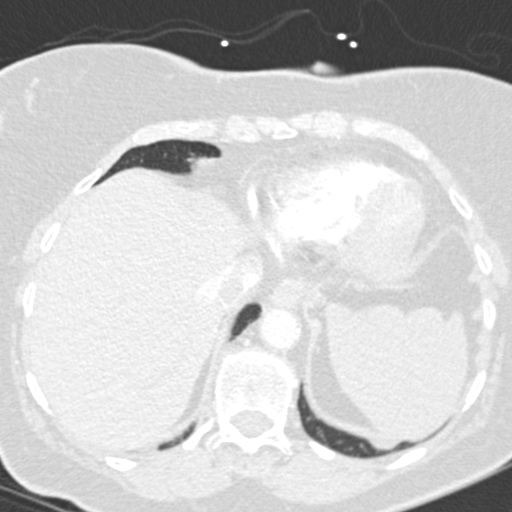
[im 23/224  mediastinal]
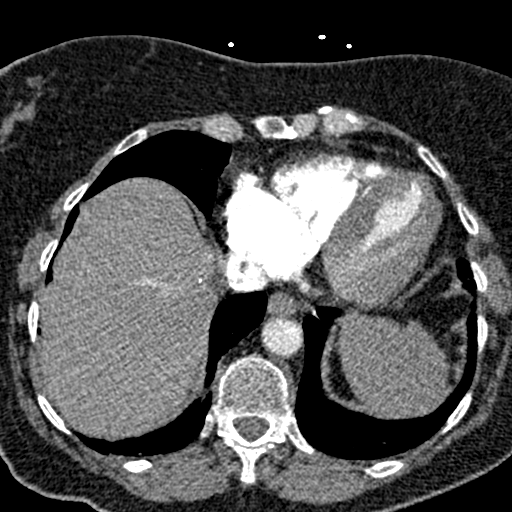
[im 34/224  lung]
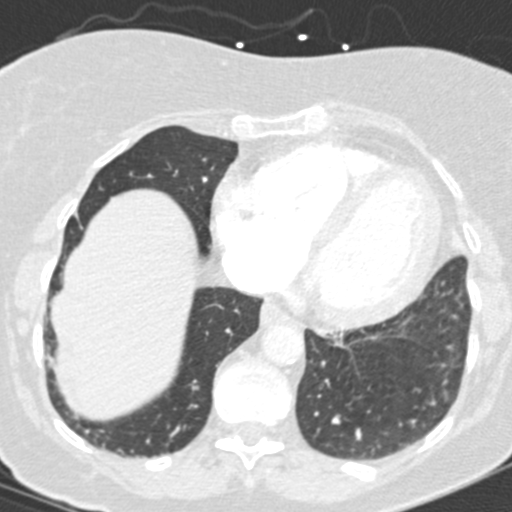
[im 56/224  mediastinal]
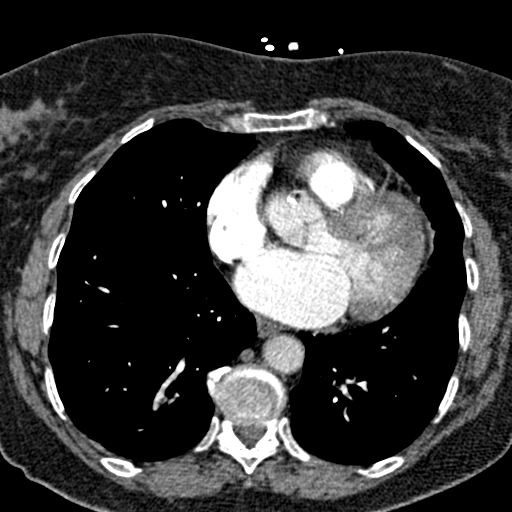
[im 67/224  lung]
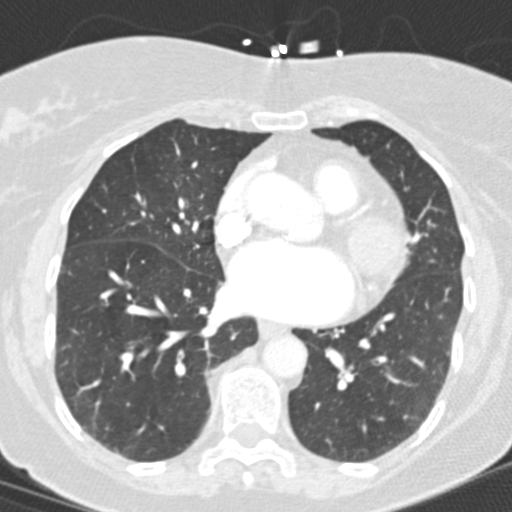
[im 75/224  mediastinal]
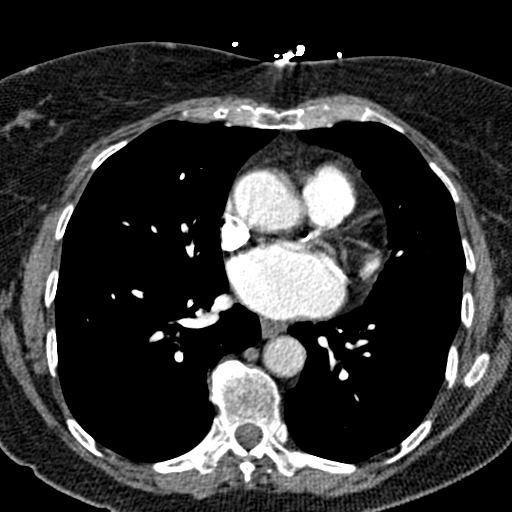
[im 79/224  lung]
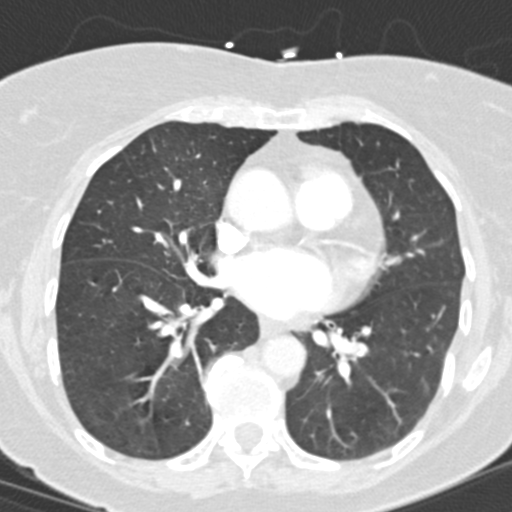
[im 90/224  mediastinal]
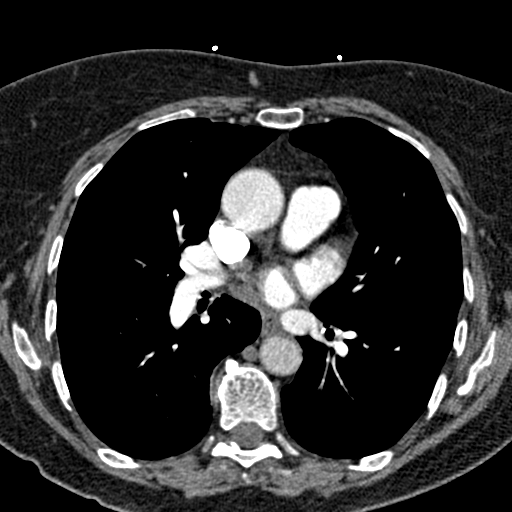
[im 101/224  lung]
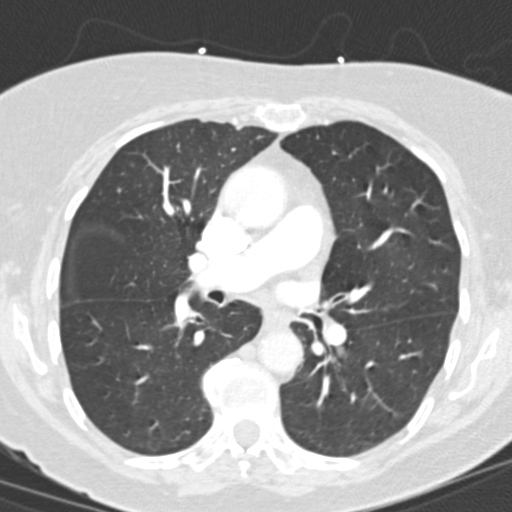
[im 112/224  mediastinal]
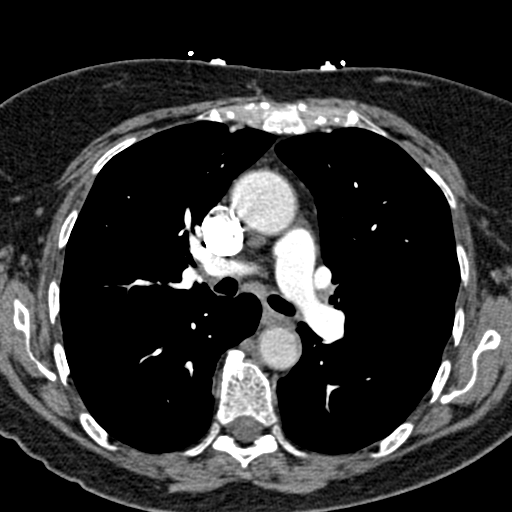
[im 123/224  lung]
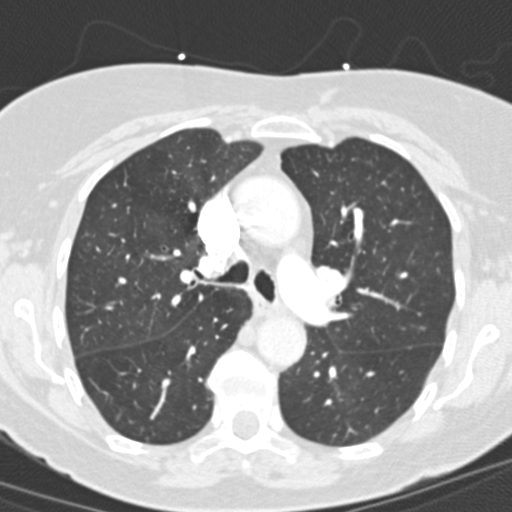
[im 134/224  mediastinal]
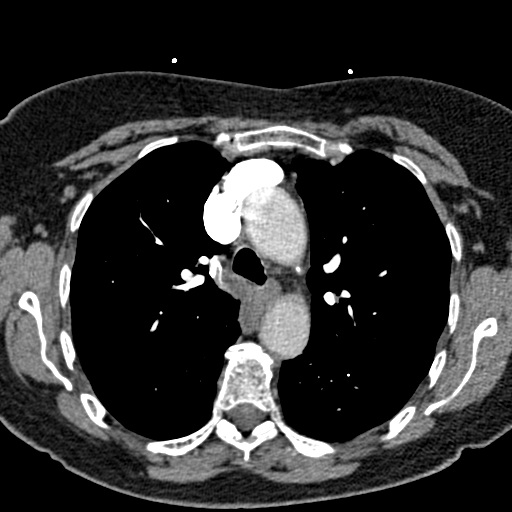
[im 145/224  lung]
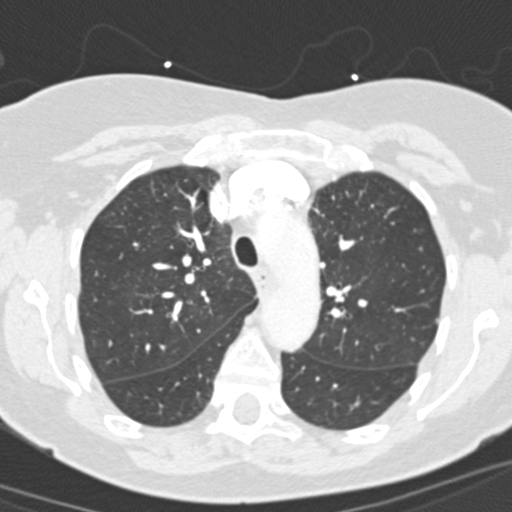
[im 149/224  mediastinal]
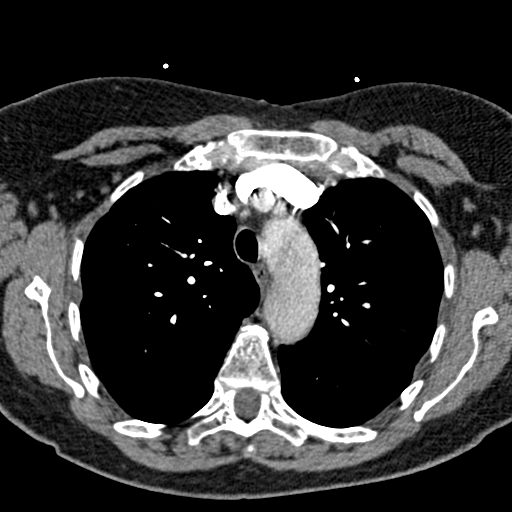
[im 157/224  lung]
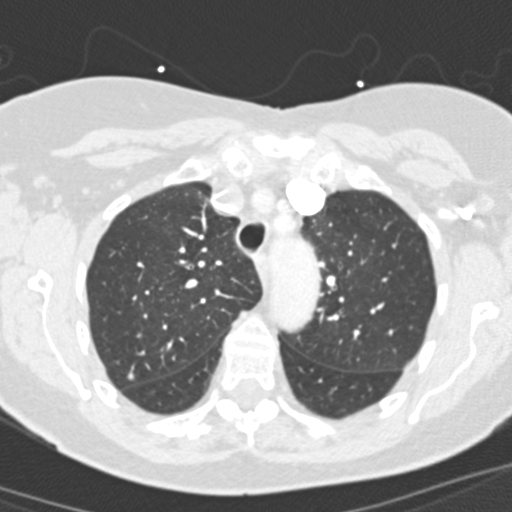
[im 168/224  mediastinal]
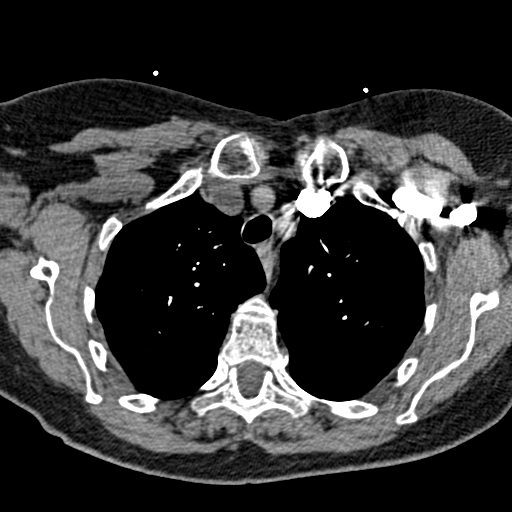
[im 190/224  lung]
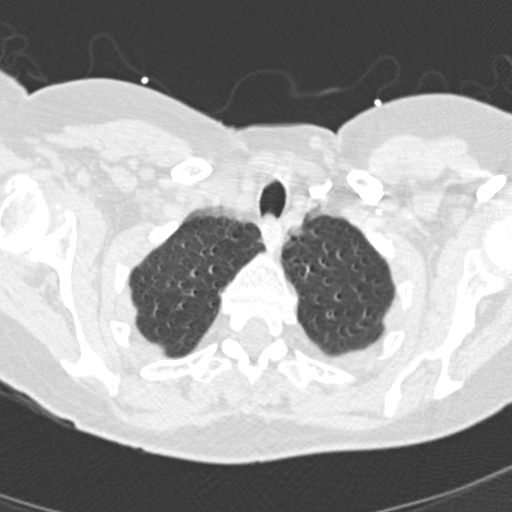
[im 201/224  mediastinal]
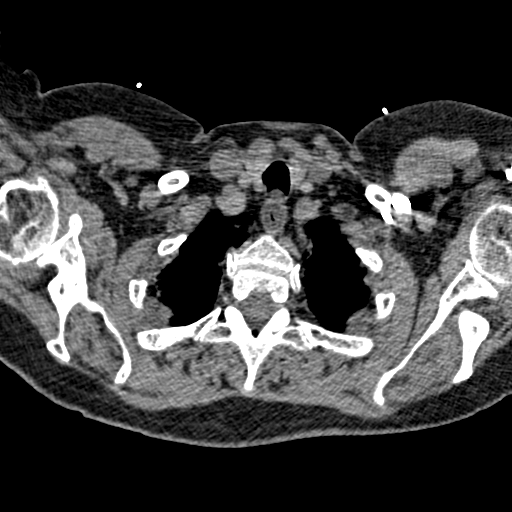
[im 212/224  lung]
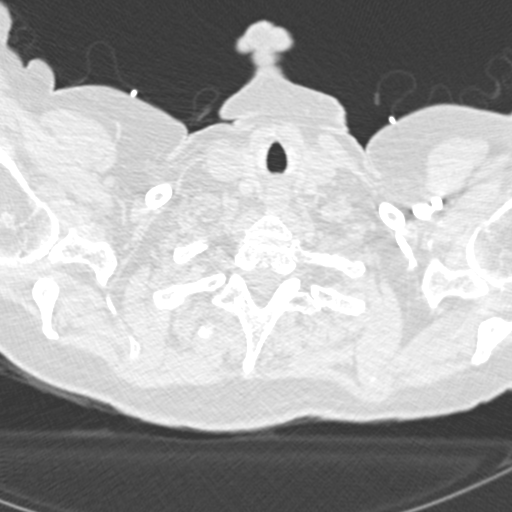

[19 of 32 positions shown; findings below may reference images not displayed]

FINDINGS: This is a satisfactory study for evaluating the
pulmonary arteries.  No focal filling defects are seen in the
pulmonary arterial tree to suggest pulmonary embolism.  Central
pulmonary arteries are normal in caliber.

The thoracic aorta is within normal limits for caliber and contains
scattered atherosclerotic calcification.  Negative for dissection.

There is mild cardiomegaly.  Coronary artery atherosclerotic
calcification is seen.

Negative for pleural pericardial effusion.  Negative for
lymphadenopathy.  Thyroid gland is within normal limits.

Mild pleuroparenchymal thickening at the lung apices.  The lungs
are well aerated.  There is no airspace disease, significant
interstitial abnormality, or pulmonary mass.  The trachea and
mainstem bronchi are patent.

Thoracic spine vertebral bodies are normal in height and alignment.
No acute or suspicious bony abnormality. No acute findings seen in
the visualized upper abdomen.

Review of the MIP images confirms the above findings.
IMPRESSION: 1.  Negative for pulmonary embolism.  No acute findings in the
chest.
2.  Mild cardiomegaly.
3.  Atherosclerotic calcification of the coronary arteries and
thoracic aorta.

## 2012-05-17 ENCOUNTER — Ambulatory Visit (INDEPENDENT_AMBULATORY_CARE_PROVIDER_SITE_OTHER): Payer: Medicare Other | Admitting: Internal Medicine

## 2012-05-17 ENCOUNTER — Encounter: Payer: Self-pay | Admitting: Internal Medicine

## 2012-05-17 VITALS — BP 128/62 | HR 58 | Temp 97.6°F | Resp 14 | Wt 178.2 lb

## 2012-05-17 DIAGNOSIS — D649 Anemia, unspecified: Secondary | ICD-10-CM

## 2012-05-17 DIAGNOSIS — Z23 Encounter for immunization: Secondary | ICD-10-CM

## 2012-05-17 DIAGNOSIS — R195 Other fecal abnormalities: Secondary | ICD-10-CM

## 2012-05-17 DIAGNOSIS — G2581 Restless legs syndrome: Secondary | ICD-10-CM

## 2012-05-17 MED ORDER — ROPINIROLE HCL 0.25 MG PO TABS: 0.2500 mg | ORAL_TABLET | Freq: Every day | ORAL | Status: DC

## 2012-05-17 NOTE — Progress Notes (Signed)
  Subjective:    Patient ID: [name], female    DOB: 18-Aug-1920, 78 y.o.   MRN: [id number]  HPI Pt presents to clinic for evaluation of leg pain. Notes continued intermittent pain and cramping in bilateral calves despite magnesium supplementation. Occurs at night and notes need to move her legs. Left leg worse than right. Has attempted heating pad and compression sleeve as well. Reviewed +hemoccult cards-attempted to contact pt multiple times to discuss unsuccessfully. Has continued dark stools without gross bleeding.  Past Medical History  Diagnosis Date  . Arthritis   . Chicken pox   . Chronic kidney disease   . Colon polyps   . Hypertension   . Dementia   . Angina   . Dysrhythmia   . Heart murmur   . Shortness of breath   . Recurrent upper respiratory infection (URI)   . Pneumonia   . Hypothyroidism   . Anemia   . Headache   . Anxiety   . Depression    Past Surgical History  Procedure Date  . Appendectomy   . Cholecystectomy   . Kidney stones   . Tonsillectomy   . Abdominal hysterectomy   . Hand surgery   . Feet surgery   . Shoulder arthroscopy   . Back surgery   . Tonsillectomy   . Eye surgery     cataract removed and eye lids lifted  . Fracture surgery     bilateral arms  . Tubal ligation     reports that she quit smoking about 71 years ago. Her smoking use included Cigarettes. She has a .2 pack-year smoking history. She has never used smokeless tobacco. She reports that she does not drink alcohol or use illicit drugs. family history includes Birth defects in her other and Heart disease in her father, mother, and other. Allergies  Allergen Reactions  . Codeine   . Hydrocodone      Review of Systems see hpi     Objective:   Physical Exam  Nursing note and vitals reviewed. Constitutional: She appears well-developed and well-nourished. No distress.  HENT:  Head: Normocephalic and atraumatic.  Musculoskeletal:       FROM bilateral legs. Gait nl.  Muscle tone nl.  Neurological: She is alert.  Skin: She is not diaphoretic.  Psychiatric: She has a normal mood and affect.          Assessment & Plan:

## 2012-06-06 DIAGNOSIS — G2581 Restless legs syndrome: Secondary | ICD-10-CM | POA: Insufficient documentation

## 2012-06-06 NOTE — Assessment & Plan Note (Signed)
Heme + without gross active bleeding. Proceed with GI consult

## 2012-06-06 NOTE — Assessment & Plan Note (Signed)
Attempt requip. Followup if no improvement or worsening.

## 2012-06-12 ENCOUNTER — Encounter (HOSPITAL_COMMUNITY): Payer: Self-pay | Admitting: Nurse Practitioner

## 2012-06-12 ENCOUNTER — Emergency Department (HOSPITAL_COMMUNITY)
Admission: EM | Admit: 2012-06-12 | Discharge: 2012-06-12 | Disposition: A | Discharge status: Home / Self Care | Payer: Medicare Other | Attending: Emergency Medicine | Admitting: Emergency Medicine

## 2012-06-12 DIAGNOSIS — Z860100 Personal history of colon polyps, unspecified: Secondary | ICD-10-CM | POA: Insufficient documentation

## 2012-06-12 DIAGNOSIS — Z8619 Personal history of other infectious and parasitic diseases: Secondary | ICD-10-CM | POA: Insufficient documentation

## 2012-06-12 DIAGNOSIS — Z87891 Personal history of nicotine dependence: Secondary | ICD-10-CM | POA: Insufficient documentation

## 2012-06-12 DIAGNOSIS — Z8679 Personal history of other diseases of the circulatory system: Secondary | ICD-10-CM | POA: Insufficient documentation

## 2012-06-12 DIAGNOSIS — F411 Generalized anxiety disorder: Secondary | ICD-10-CM | POA: Insufficient documentation

## 2012-06-12 DIAGNOSIS — N189 Chronic kidney disease, unspecified: Secondary | ICD-10-CM | POA: Insufficient documentation

## 2012-06-12 DIAGNOSIS — Z8669 Personal history of other diseases of the nervous system and sense organs: Secondary | ICD-10-CM | POA: Insufficient documentation

## 2012-06-12 DIAGNOSIS — Z862 Personal history of diseases of the blood and blood-forming organs and certain disorders involving the immune mechanism: Secondary | ICD-10-CM | POA: Insufficient documentation

## 2012-06-12 DIAGNOSIS — Z8701 Personal history of pneumonia (recurrent): Secondary | ICD-10-CM | POA: Insufficient documentation

## 2012-06-12 DIAGNOSIS — Z8739 Personal history of other diseases of the musculoskeletal system and connective tissue: Secondary | ICD-10-CM | POA: Insufficient documentation

## 2012-06-12 DIAGNOSIS — R252 Cramp and spasm: Secondary | ICD-10-CM

## 2012-06-12 DIAGNOSIS — M79609 Pain in unspecified limb: Secondary | ICD-10-CM

## 2012-06-12 DIAGNOSIS — I129 Hypertensive chronic kidney disease with stage 1 through stage 4 chronic kidney disease, or unspecified chronic kidney disease: Secondary | ICD-10-CM | POA: Insufficient documentation

## 2012-06-12 DIAGNOSIS — Z8659 Personal history of other mental and behavioral disorders: Secondary | ICD-10-CM | POA: Insufficient documentation

## 2012-06-12 DIAGNOSIS — Z8709 Personal history of other diseases of the respiratory system: Secondary | ICD-10-CM | POA: Insufficient documentation

## 2012-06-12 DIAGNOSIS — Z79899 Other long term (current) drug therapy: Secondary | ICD-10-CM | POA: Insufficient documentation

## 2012-06-12 DIAGNOSIS — R011 Cardiac murmur, unspecified: Secondary | ICD-10-CM | POA: Insufficient documentation

## 2012-06-12 DIAGNOSIS — E039 Hypothyroidism, unspecified: Secondary | ICD-10-CM | POA: Insufficient documentation

## 2012-06-12 LAB — COMPREHENSIVE METABOLIC PANEL
Alkaline Phosphatase: 81 U/L (ref 39–117)
BUN: 19 mg/dL (ref 6–23)
Chloride: 99 mEq/L (ref 96–112)
Creatinine, Ser: 0.98 mg/dL (ref 0.50–1.10)
GFR calc Af Amer: 58 mL/min — ABNORMAL LOW (ref >90)
Glucose, Bld: 100 mg/dL — ABNORMAL HIGH (ref 70–99)
Potassium: 3.6 mEq/L (ref 3.5–5.1)
Total Bilirubin: 0.6 mg/dL (ref 0.3–1.2)

## 2012-06-12 MED ORDER — TRAMADOL HCL 50 MG PO TABS
50.0000 mg | ORAL_TABLET | Freq: Once | ORAL | Status: AC
  Administered 2012-06-12: 50 mg via ORAL
  Filled 2012-06-12: qty 1

## 2012-06-12 MED ORDER — METHOCARBAMOL 100 MG/ML IJ SOLN
500.0000 mg | Freq: Once | INTRAMUSCULAR | Status: DC
  Filled 2012-06-12: qty 5

## 2012-06-12 MED ORDER — METHOCARBAMOL 100 MG/ML IJ SOLN
500.0000 mg | Freq: Once | INTRAVENOUS | Status: AC
  Administered 2012-06-12: 500 mg via INTRAVENOUS
  Filled 2012-06-12: qty 5

## 2012-06-12 MED ORDER — METHOCARBAMOL 500 MG PO TABS: 500.0000 mg | ORAL_TABLET | Freq: Two times a day (BID) | ORAL | Status: DC

## 2012-06-12 MED ORDER — DIAZEPAM 5 MG PO TABS
5.0000 mg | ORAL_TABLET | Freq: Once | ORAL | Status: AC
  Administered 2012-06-12: 5 mg via ORAL
  Filled 2012-06-12: qty 1

## 2012-06-12 MED ORDER — SODIUM CHLORIDE 0.9 % IV BOLUS (SEPSIS)
500.0000 mL | Freq: Once | INTRAVENOUS | Status: AC
  Administered 2012-06-12: 1000 mL via INTRAVENOUS

## 2012-06-12 NOTE — ED Provider Notes (Signed)
History     CSN: [id number]  Arrival date & time 06/12/12  1048   First MD Initiated Contact with Patient 06/12/12 1138      Chief Complaint  Patient presents with  . Leg Pain  HPI [name] is a 79 y.o. female who presents to the ED with leg pain. The pain is located in the left leg. The pain has been off and on for the past several months. She describes the pain as cramping. The pain is usually in the calf but last night radiated to the hip. This morning the pain has continued. She has been evaluated by her PCP and than sent to Dr. Elnoria Howard for further evaluation. He said to continue Magnesium and Requip. She denies nausea, vomiting or other problems. The history was provided by the patient.   Past Medical History  Diagnosis Date  . Arthritis   . Chicken pox   . Chronic kidney disease   . Colon polyps   . Hypertension   . Dementia   . Angina   . Dysrhythmia   . Heart murmur   . Shortness of breath   . Recurrent upper respiratory infection (URI)   . Pneumonia   . Hypothyroidism   . Anemia   . Headache   . Anxiety   . Depression     Past Surgical History  Procedure Date  . Appendectomy   . Cholecystectomy   . Kidney stones   . Tonsillectomy   . Abdominal hysterectomy   . Hand surgery   . Feet surgery   . Shoulder arthroscopy   . Back surgery   . Tonsillectomy   . Eye surgery     cataract removed and eye lids lifted  . Fracture surgery     bilateral arms  . Tubal ligation     Family History  Problem Relation Age of Onset  . Heart disease Mother   . Heart disease Father   . Heart disease Other   . Birth defects Other     History  Substance Use Topics  . Smoking status: Former Smoker -- 0.2 packs/day for 1 years    Types: Cigarettes    Quit date: 06/02/1941  . Smokeless tobacco: Never Used  . Alcohol Use: No    OB History    Grav Para Term Preterm Abortions TAB SAB Ect Mult Living                  Review of Systems  Constitutional: Negative  for fever, chills, diaphoresis and fatigue.  HENT: Negative for ear pain, congestion, sore throat, facial swelling, neck pain, neck stiffness, dental problem and sinus pressure.   Eyes: Negative for photophobia, pain and discharge.  Respiratory: Positive for cough. Negative for chest tightness and wheezing.   Cardiovascular: Negative for leg swelling.  Gastrointestinal: Negative for nausea, vomiting, abdominal pain, diarrhea, constipation and abdominal distention.  Genitourinary: Negative for dysuria, frequency, flank pain and difficulty urinating.  Musculoskeletal: Positive for back pain. Negative for myalgias and gait problem.       Pain radiates to left hip and lower back  Skin: Negative for color change and rash.  Neurological: Negative for dizziness, speech difficulty, weakness, light-headedness, numbness and headaches.  Psychiatric/Behavioral: Negative for confusion and agitation. The patient is not nervous/anxious.     Allergies  Codeine and Hydrocodone  Home Medications   Current Outpatient Rx  Name  Route  Sig  Dispense  Refill  . HYDROCHLOROTHIAZIDE 12.5 MG PO CAPS  TAKE 2 CAPSULES BY MOUTH DAILY.   60 capsule   3   . LEVOTHYROXINE SODIUM 50 MCG PO TABS      TAKE 1 TABLET BY MOUTH DAILY.   30 tablet   3   . LORAZEPAM 1 MG PO TABS   Oral   Take 1 tablet (1 mg total) by mouth at bedtime as needed. For anxiety/sleep.   30 tablet   0   . MAGNESIUM PO   Oral   Take 1 each by mouth at bedtime.          Marland Kitchen ROPINIROLE HCL 0.25 MG PO TABS   Oral   Take 1 tablet (0.25 mg total) by mouth at bedtime.   30 tablet   1     BP 150/61  Pulse 55  Temp 98.2 F (36.8 C) (Oral)  Resp 16  SpO2 97%  Physical Exam  Nursing note and vitals reviewed. Constitutional: She is oriented to person, place, and time. She appears well-developed and well-nourished.  HENT:  Head: Normocephalic and atraumatic.  Eyes: EOM are normal. Pupils are equal, round, and reactive to  light.  Neck: Neck supple.  Cardiovascular: Normal rate and regular rhythm.   Pulmonary/Chest: Effort normal. No respiratory distress. She has no wheezes.  Abdominal: Soft. There is no tenderness.  Musculoskeletal:       Tenderness on exam left calf. Pedal pulses presents and equal.  Neurological: She is alert and oriented to person, place, and time. No cranial nerve deficit or sensory deficit.  Skin: Skin is warm and dry.  Psychiatric: She has a normal mood and affect. Her behavior is normal. Judgment and thought content normal.   Procedures  @ 14:00 patient awaiting doppler study. @ 1500 care turned over to Levy Sjogren, NP waiting results of doppler study.       [facility] Orlene Och, NP 06/12/12 1520

## 2012-06-12 NOTE — ED Provider Notes (Signed)
Medical screening examination/treatment/procedure(s) were performed by non-physician practitioner and as supervising physician I was immediately available for consultation/collaboration. Bonnie Roig, MD, FACEP   Krish Bailly L Christia Coaxum, MD 06/12/12 1715 

## 2012-06-12 NOTE — ED Notes (Signed)
Assisted patient to put pants back on.  Patient unable to stand.  Two person assisted.  Unsuccessful.

## 2012-06-12 NOTE — ED Notes (Signed)
Pt reports "lots of left leg pain" recently and then noticed yesterday she could feel knots in L posterior leg and pain radiating into L hip. Reports she was so uncomfortable last night she could not even sleep. Tried to elevate and apply heat with no relief.

## 2012-06-12 NOTE — ED Notes (Signed)
Patient able to ambulate.  Two person assisted.

## 2012-06-12 NOTE — Progress Notes (Addendum)
VASCULAR LAB PRELIMINARY  PRELIMINARY  PRELIMINARY  PRELIMINARY  ABI  completed.    VASCULAR LAB PRELIMINARY  ARTERIAL  ABI completed:  Right ABI >1.0.  Left ABI 0.93.  Arterial flow is adequate, bilaterally.  Full report to follow.     RIGHT    LEFT    PRESSURE WAVEFORM  PRESSURE WAVEFORM  BRACHIAL 162 T BRACHIAL 152 T  DP   DP    AT 171 T AT 151 B  PT 180 T PT 145 B  PER   PER    GREAT TOE  NA GREAT TOE  NA    RIGHT LEFT  ABI 1.11 0.93     [name], RVT 06/12/2012, 7:59 PM   [name], RVT 06/12/2012, 3:07 PM

## 2012-06-12 NOTE — ED Notes (Signed)
PT reports the leg pain increases at night.

## 2012-06-17 ENCOUNTER — Telehealth: Payer: Self-pay | Admitting: *Deleted

## 2012-06-17 NOTE — Telephone Encounter (Signed)
Ok to refill the robaxin if it doesn't make her sleepy/unsteady. #60 and change to prn. See if requip helped any. Prescribed at last visit. Dose can be increased.

## 2012-06-17 NOTE — Telephone Encounter (Signed)
Reports pt was seen in the ER on 06/12/12 for severe muscle cramping, knots in her legs. Ruled out DVT and blocked arteries per daughter in law. Pt was prescribed Robaxin and has completed Rx. They are requesting refill. [name] states that pt only likes to drink coffee and Dr Frazier Richards. Pt was advised in the ER that products containing caffeine such as these will only make dehydration worse. They have tried to get pt to drink gatorade but are still having difficulty getting pt to hydrate correctly. Pt has ER f/u on 06/29/12. Please advise.

## 2012-06-18 MED ORDER — ROPINIROLE HCL 0.25 MG PO TABS: 0.5000 mg | ORAL_TABLET | Freq: Every day | ORAL | Status: DC

## 2012-06-18 MED ORDER — METHOCARBAMOL 500 MG PO TABS: 500.0000 mg | ORAL_TABLET | Freq: Two times a day (BID) | ORAL | Status: DC | PRN

## 2012-06-18 NOTE — Telephone Encounter (Signed)
i started a low dose at .25 qhs. She can increase to .5mg  qhs

## 2012-06-18 NOTE — Telephone Encounter (Signed)
Rx to pharmacy; caller informed, understood & agreed/SLS

## 2012-06-18 NOTE — Telephone Encounter (Signed)
Spoke with [name] and she states that requip seemed to help at first but can't tell that it has been helping. She reports that pt is not very active in the home and doesn't think she has been unsteady or sleepy as pt is telling them that she isn't sleeping good. Refill sent. Please advise re: requip dose.

## 2012-06-29 ENCOUNTER — Ambulatory Visit (HOSPITAL_BASED_OUTPATIENT_CLINIC_OR_DEPARTMENT_OTHER)
Admission: RE | Admit: 2012-06-29 | Discharge: 2012-06-29 | Disposition: A | Discharge status: Home / Self Care | Payer: Medicare Other | Source: Ambulatory Visit | Attending: Family | Admitting: Family

## 2012-06-29 ENCOUNTER — Ambulatory Visit (INDEPENDENT_AMBULATORY_CARE_PROVIDER_SITE_OTHER): Payer: Medicare Other | Admitting: Family

## 2012-06-29 ENCOUNTER — Encounter: Payer: Self-pay | Admitting: Family

## 2012-06-29 VITALS — BP 130/88 | HR 67 | Temp 98.6°F | Resp 16 | Wt 179.0 lb

## 2012-06-29 DIAGNOSIS — IMO0001 Reserved for inherently not codable concepts without codable children: Secondary | ICD-10-CM

## 2012-06-29 DIAGNOSIS — M79669 Pain in unspecified lower leg: Secondary | ICD-10-CM

## 2012-06-29 DIAGNOSIS — I10 Essential (primary) hypertension: Secondary | ICD-10-CM

## 2012-06-29 DIAGNOSIS — R252 Cramp and spasm: Secondary | ICD-10-CM

## 2012-06-29 DIAGNOSIS — G2581 Restless legs syndrome: Secondary | ICD-10-CM

## 2012-06-29 DIAGNOSIS — M791 Myalgia, unspecified site: Secondary | ICD-10-CM

## 2012-06-29 DIAGNOSIS — R195 Other fecal abnormalities: Secondary | ICD-10-CM

## 2012-06-29 DIAGNOSIS — M79609 Pain in unspecified limb: Secondary | ICD-10-CM | POA: Insufficient documentation

## 2012-06-29 LAB — CBC WITH DIFFERENTIAL/PLATELET
Basophils Absolute: 0 10*3/uL (ref 0.0–0.1)
HCT: 37.6 % (ref 36.0–46.0)
Hemoglobin: 12.8 g/dL (ref 12.0–15.0)
Lymphocytes Relative: 24 % (ref 12–46)
Lymphs Abs: 2.4 10*3/uL (ref 0.7–4.0)
Monocytes Absolute: 0.5 10*3/uL (ref 0.1–1.0)
Neutro Abs: 7 10*3/uL (ref 1.7–7.7)
RBC: 4.39 MIL/uL (ref 3.87–5.11)
RDW: 13.5 % (ref 11.5–15.5)
WBC: 10 10*3/uL (ref 4.0–10.5)

## 2012-06-29 LAB — BASIC METABOLIC PANEL
BUN: 19 mg/dL (ref 6–23)
CO2: 31 mEq/L (ref 19–32)
Chloride: 100 mEq/L (ref 96–112)
Glucose, Bld: 94 mg/dL (ref 70–99)
Potassium: 3.9 mEq/L (ref 3.5–5.3)
Sodium: 138 mEq/L (ref 135–145)

## 2012-06-29 LAB — CK: Total CK: 100 U/L (ref 7–177)

## 2012-06-29 MED ORDER — ROPINIROLE HCL 1 MG PO TABS: 1.0000 mg | ORAL_TABLET | Freq: Every day | ORAL | Status: DC

## 2012-06-29 NOTE — Progress Notes (Signed)
Subjective:    Patient ID: [name], female    DOB: Jul 28, 1920, 79 y.o.   MRN: [id number]  HPI  [name] is an 79 yr old female who presents today with chief complaint of muscle pain.  She was initially evaluated by Dr. Rodena Medin for same complaint on 12/16. She was placed on requip.  She went to the ED on 1/11 and was had an ABI which did show adequate blood flow. She reports knots up in the leg.  She reports that the pain has been constant recently and bothers her worse with walking.  "leaves my legs sore."  She reports that the pain goes "up into the left hip." She denies joint pain or fever.    Heme+ stool- she reports that she will be meeting with Dr. Loreta Ave on 2/18.     Review of Systems See HPI    Past Medical History  Diagnosis Date  . Arthritis   . Chicken pox   . Chronic kidney disease   . Colon polyps   . Hypertension   . Dementia   . Angina   . Dysrhythmia   . Heart murmur   . Shortness of breath   . Recurrent upper respiratory infection (URI)   . Pneumonia   . Hypothyroidism   . Anemia   . Headache   . Anxiety   . Depression     History   Social History  . Marital Status: Widowed    Spouse Name: N/A    Number of Children: N/A  . Years of Education: N/A   Occupational History  . Not on file.   Social History Main Topics  . Smoking status: Former Smoker -- 0.2 packs/day for 1 years    Types: Cigarettes    Quit date: 06/02/1941  . Smokeless tobacco: Never Used  . Alcohol Use: No  . Drug Use: No  . Sexually Active: Not Currently    Birth Control/ Protection: Abstinence   Other Topics Concern  . Not on file   Social History Narrative   Still lives at home and ambulatory daily. Doesn't use cane or walker, but has cane at home. Remote smoking history.     Past Surgical History  Procedure Date  . Appendectomy   . Cholecystectomy   . Kidney stones   . Tonsillectomy   . Abdominal hysterectomy   . Hand surgery   . Feet surgery   . Shoulder  arthroscopy   . Back surgery   . Tonsillectomy   . Eye surgery     cataract removed and eye lids lifted  . Fracture surgery     bilateral arms  . Tubal ligation     Family History  Problem Relation Age of Onset  . Heart disease Mother   . Heart disease Father   . Heart disease Other   . Birth defects Other     Allergies  Allergen Reactions  . Codeine Rash  . Hydrocodone Rash    Current Outpatient Prescriptions on File Prior to Visit  Medication Sig Dispense Refill  . hydrochlorothiazide (MICROZIDE) 12.5 MG capsule TAKE 2 CAPSULES BY MOUTH DAILY.  60 capsule  3  . levothyroxine (SYNTHROID, LEVOTHROID) 50 MCG tablet TAKE 1 TABLET BY MOUTH DAILY.  30 tablet  3  . LORazepam (ATIVAN) 1 MG tablet Take 1 tablet (1 mg total) by mouth at bedtime as needed. For anxiety/sleep.  30 tablet  0  . MAGNESIUM PO Take 500 mg by mouth at bedtime.       Marland Kitchen  methocarbamol (ROBAXIN) 500 MG tablet Take 1 tablet (500 mg total) by mouth 2 (two) times daily as needed.  60 tablet  0  . rOPINIRole (REQUIP) 1 MG tablet Take 1 tablet (1 mg total) by mouth at bedtime.  30 tablet  2    BP 130/88  Pulse 67  Temp 98.6 F (37 C) (Oral)  Resp 16  Wt 179 lb (81.194 kg)  SpO2 99%    Objective:   Physical Exam  Constitutional: She appears well-developed and well-nourished.  HENT:  Head: Normocephalic and atraumatic.  Cardiovascular: Normal rate and regular rhythm.   No murmur heard. Pulmonary/Chest: Effort normal and breath sounds normal. No respiratory distress. She has no wheezes. She has no rales. She exhibits no tenderness.  Musculoskeletal: She exhibits no edema.       Left calf tender to palpation, no induration or swelling is noted.   Neurological: She is alert.  Skin: Skin is warm and dry.  Psychiatric: She has a normal mood and affect. Her behavior is normal. Thought content normal.          Assessment & Plan:

## 2012-06-29 NOTE — Patient Instructions (Addendum)
Please complete your ultrasound on the first floor this afternoon. Complete lab work prior to leaving.  Make sure that you drink 6-8 glasses of water (caffeine free liquid) every day. Follow up in 1 month.

## 2012-06-30 LAB — SEDIMENTATION RATE: Sed Rate: 4 mm/hr (ref 0–22)

## 2012-06-30 NOTE — Assessment & Plan Note (Addendum)
CK, rheumatoid factor, ANA all negative.  LLE doppler negative for DVT.  Daughter in law notes that she "doesn't drink enough."  I have advised her to ensure adequate hydration as I think this is a contributing factor.

## 2012-06-30 NOTE — Assessment & Plan Note (Signed)
Following with GI.  Follow up CBC is normal.

## 2012-06-30 NOTE — Assessment & Plan Note (Signed)
Will also try increasing requip to 1 mg.

## 2012-07-27 ENCOUNTER — Encounter: Payer: Self-pay | Admitting: Family

## 2012-07-27 ENCOUNTER — Ambulatory Visit (INDEPENDENT_AMBULATORY_CARE_PROVIDER_SITE_OTHER): Payer: Medicare Other | Admitting: Family

## 2012-07-27 VITALS — BP 124/80 | HR 61 | Temp 98.1°F | Resp 16 | Ht 65.0 in | Wt 180.0 lb

## 2012-07-27 DIAGNOSIS — R252 Cramp and spasm: Secondary | ICD-10-CM

## 2012-07-27 DIAGNOSIS — G2581 Restless legs syndrome: Secondary | ICD-10-CM

## 2012-07-27 DIAGNOSIS — R195 Other fecal abnormalities: Secondary | ICD-10-CM

## 2012-07-27 DIAGNOSIS — R05 Cough: Secondary | ICD-10-CM

## 2012-07-27 MED ORDER — BENZONATATE 100 MG PO CAPS: 100.0000 mg | ORAL_CAPSULE | Freq: Three times a day (TID) | ORAL | Status: DC | PRN

## 2012-07-27 NOTE — Progress Notes (Signed)
Subjective:    Patient ID: [name], female    DOB: [birth year], 79 y.o.   MRN: [id number]  HPI  RLS- notes improvement on increased dose of Requip. Reports that she had a fall one night this week.  Hit her head on the night stand.  Leg cramps- work up negative.  Reports that this has been better. She has been drinking more water.    Cough- She reports mild cough for several days.  Reports a "dry cough."  She used some tessalon perles which helped but she is out.   Heme + stool- She met with Dr. Loreta Ave last week who has scheduled a colonoscopy. Review of Systems See HPI  Past Medical History  Diagnosis Date  . Arthritis   . Chicken pox   . Chronic kidney disease   . Colon polyps   . Hypertension   . Dementia   . Angina   . Dysrhythmia   . Heart murmur   . Shortness of breath   . Recurrent upper respiratory infection (URI)   . Pneumonia   . Hypothyroidism   . Anemia   . Headache   . Anxiety   . Depression     History   Social History  . Marital Status: Widowed    Spouse Name: N/A    Number of Children: N/A  . Years of Education: N/A   Occupational History  . Not on file.   Social History Main Topics  . Smoking status: Former Smoker -- 0.20 packs/day for 1 years    Types: Cigarettes    Quit date: 06/02/1941  . Smokeless tobacco: Never Used  . Alcohol Use: No  . Drug Use: No  . Sexually Active: Not Currently    Birth Control/ Protection: Abstinence   Other Topics Concern  . Not on file   Social History Narrative   Still lives at home and ambulatory daily. Doesn't use cane or walker, but has cane at home. Remote smoking history.     Past Surgical History  Procedure Laterality Date  . Appendectomy    . Cholecystectomy    . Kidney stones    . Tonsillectomy    . Abdominal hysterectomy    . Hand surgery    . Feet surgery    . Shoulder arthroscopy    . Back surgery    . Tonsillectomy    . Eye surgery      cataract removed and eye lids lifted  .  Fracture surgery      bilateral arms  . Tubal ligation      Family History  Problem Relation Age of Onset  . Heart disease Mother   . Heart disease Father   . Heart disease Other   . Birth defects Other     Allergies  Allergen Reactions  . Codeine Rash  . Hydrocodone Rash    Current Outpatient Prescriptions on File Prior to Visit  Medication Sig Dispense Refill  . hydrochlorothiazide (MICROZIDE) 12.5 MG capsule TAKE 2 CAPSULES BY MOUTH DAILY.  60 capsule  3  . levothyroxine (SYNTHROID, LEVOTHROID) 50 MCG tablet TAKE 1 TABLET BY MOUTH DAILY.  30 tablet  3  . LORazepam (ATIVAN) 1 MG tablet Take 1 tablet (1 mg total) by mouth at bedtime as needed. For anxiety/sleep.  30 tablet  0  . MAGNESIUM PO Take 500 mg by mouth at bedtime.       . methocarbamol (ROBAXIN) 500 MG tablet Take 1 tablet (500 mg total) by  mouth 2 (two) times daily as needed.  60 tablet  0  . rOPINIRole (REQUIP) 1 MG tablet Take 1 tablet (1 mg total) by mouth at bedtime.  30 tablet  2   No current facility-administered medications on file prior to visit.    BP 124/80  Pulse 61  Temp(Src) 98.1 F (36.7 C) (Oral)  Resp 16  Ht 5\' 5"  (1.651 m)  Wt 180 lb (81.647 kg)  BMI 29.95 kg/m2  SpO2 99%       Objective:   Physical Exam  Constitutional: She is oriented to person, place, and time. She appears well-developed and well-nourished. No distress.  HENT:  Head: Normocephalic.  Mild bruising noted on left cheek.  Cardiovascular: Normal rate and regular rhythm.   No murmur heard. Pulmonary/Chest: Effort normal and breath sounds normal. No respiratory distress. She has no wheezes. She has no rales. She exhibits no tenderness.  Neurological: She is alert and oriented to person, place, and time.  Psychiatric: She has a normal mood and affect. Her behavior is normal. Judgment and thought content normal.          Assessment & Plan:

## 2012-07-27 NOTE — Assessment & Plan Note (Signed)
Rx provided for tessalon. If symptoms worsen or do not improve, consider follow up cxr.

## 2012-07-27 NOTE — Patient Instructions (Signed)
Call if cough worsens or if no improvement in 1 week. Please schedule a follow up appointment in 3 months.

## 2012-07-27 NOTE — Assessment & Plan Note (Signed)
Improved with increased hydration.  Monitor.

## 2012-07-27 NOTE — Assessment & Plan Note (Signed)
Improved with requip. Continue same.

## 2012-07-27 NOTE — Assessment & Plan Note (Signed)
She is scheduled for colonoscopy with Dr. Loreta Ave.

## 2012-07-30 ENCOUNTER — Other Ambulatory Visit: Payer: Self-pay | Admitting: Internal Medicine

## 2012-08-13 ENCOUNTER — Encounter (HOSPITAL_COMMUNITY): Payer: Self-pay | Admitting: Emergency Medicine

## 2012-08-13 ENCOUNTER — Emergency Department (HOSPITAL_COMMUNITY)
Admission: EM | Admit: 2012-08-13 | Discharge: 2012-08-14 | Disposition: A | Discharge status: Home / Self Care | Payer: Medicare Other | Attending: Emergency Medicine | Admitting: Emergency Medicine

## 2012-08-13 DIAGNOSIS — Z8619 Personal history of other infectious and parasitic diseases: Secondary | ICD-10-CM | POA: Insufficient documentation

## 2012-08-13 DIAGNOSIS — F0390 Unspecified dementia without behavioral disturbance: Secondary | ICD-10-CM | POA: Insufficient documentation

## 2012-08-13 DIAGNOSIS — Z8679 Personal history of other diseases of the circulatory system: Secondary | ICD-10-CM | POA: Insufficient documentation

## 2012-08-13 DIAGNOSIS — Z860100 Personal history of colon polyps, unspecified: Secondary | ICD-10-CM | POA: Insufficient documentation

## 2012-08-13 DIAGNOSIS — N189 Chronic kidney disease, unspecified: Secondary | ICD-10-CM | POA: Insufficient documentation

## 2012-08-13 DIAGNOSIS — F411 Generalized anxiety disorder: Secondary | ICD-10-CM | POA: Insufficient documentation

## 2012-08-13 DIAGNOSIS — F3289 Other specified depressive episodes: Secondary | ICD-10-CM | POA: Insufficient documentation

## 2012-08-13 DIAGNOSIS — Z87891 Personal history of nicotine dependence: Secondary | ICD-10-CM | POA: Insufficient documentation

## 2012-08-13 DIAGNOSIS — Z79899 Other long term (current) drug therapy: Secondary | ICD-10-CM | POA: Insufficient documentation

## 2012-08-13 DIAGNOSIS — F329 Major depressive disorder, single episode, unspecified: Secondary | ICD-10-CM | POA: Insufficient documentation

## 2012-08-13 DIAGNOSIS — E039 Hypothyroidism, unspecified: Secondary | ICD-10-CM | POA: Insufficient documentation

## 2012-08-13 DIAGNOSIS — I129 Hypertensive chronic kidney disease with stage 1 through stage 4 chronic kidney disease, or unspecified chronic kidney disease: Secondary | ICD-10-CM | POA: Insufficient documentation

## 2012-08-13 DIAGNOSIS — Z8701 Personal history of pneumonia (recurrent): Secondary | ICD-10-CM | POA: Insufficient documentation

## 2012-08-13 DIAGNOSIS — Z8709 Personal history of other diseases of the respiratory system: Secondary | ICD-10-CM | POA: Insufficient documentation

## 2012-08-13 DIAGNOSIS — Z8739 Personal history of other diseases of the musculoskeletal system and connective tissue: Secondary | ICD-10-CM | POA: Insufficient documentation

## 2012-08-13 DIAGNOSIS — R51 Headache: Secondary | ICD-10-CM

## 2012-08-13 DIAGNOSIS — M549 Dorsalgia, unspecified: Secondary | ICD-10-CM | POA: Insufficient documentation

## 2012-08-13 DIAGNOSIS — R011 Cardiac murmur, unspecified: Secondary | ICD-10-CM | POA: Insufficient documentation

## 2012-08-13 LAB — OCCULT BLOOD, POC DEVICE: Fecal Occult Bld: NEGATIVE

## 2012-08-13 LAB — COMPREHENSIVE METABOLIC PANEL
Alkaline Phosphatase: 82 U/L (ref 39–117)
BUN: 15 mg/dL (ref 6–23)
Chloride: 102 mEq/L (ref 96–112)
GFR calc Af Amer: 64 mL/min — ABNORMAL LOW (ref >90)
Glucose, Bld: 100 mg/dL — ABNORMAL HIGH (ref 70–99)
Potassium: 3.7 mEq/L (ref 3.5–5.1)
Total Bilirubin: 0.4 mg/dL (ref 0.3–1.2)

## 2012-08-13 LAB — CBC WITH DIFFERENTIAL/PLATELET
Eosinophils Absolute: 0.1 10*3/uL (ref 0.0–0.7)
HCT: 36 % (ref 36.0–46.0)
Hemoglobin: 12.5 g/dL (ref 12.0–15.0)
Lymphs Abs: 2.4 10*3/uL (ref 0.7–4.0)
MCH: 29.3 pg (ref 26.0–34.0)
Monocytes Absolute: 0.5 10*3/uL (ref 0.1–1.0)
Monocytes Relative: 6 % (ref 3–12)
Neutro Abs: 4.8 10*3/uL (ref 1.7–7.7)
Neutrophils Relative %: 62 % (ref 43–77)
RBC: 4.26 MIL/uL (ref 3.87–5.11)

## 2012-08-13 MED ORDER — ACETAMINOPHEN 500 MG PO TABS
1000.0000 mg | ORAL_TABLET | Freq: Once | ORAL | Status: AC
  Administered 2012-08-13: 1000 mg via ORAL
  Filled 2012-08-13: qty 2

## 2012-08-13 NOTE — ED Provider Notes (Signed)
History     CSN: [id number]  Arrival date & time 08/13/12  2130   First MD Initiated Contact with Patient 08/13/12 2212    Chief complaint black stools No chief complaint on file.  level V caveat history dementia. History is obtained by the patient and from her granddaughter  (Consider location/radiation/quality/duration/timing/severity/associated sxs/prior treatment) HPI Patient reports that she had black stools after having had a colonoscopy this afternoon. She also complains of mild diffuse headache, gradual onset and diffuse back pain since this afternoon. She denies lightheadedness denies abdominal pain no treatment prior to coming here. Nothing makes symptoms better or worse. Past Medical History  Diagnosis Date  . Arthritis   . Chicken pox   . Chronic kidney disease   . Colon polyps   . Hypertension   . Dementia   . Angina   . Dysrhythmia   . Heart murmur   . Shortness of breath   . Recurrent upper respiratory infection (URI)   . Pneumonia   . Hypothyroidism   . Anemia   . Headache   . Anxiety   . Depression     Past Surgical History  Procedure Laterality Date  . Appendectomy    . Cholecystectomy    . Kidney stones    . Tonsillectomy    . Abdominal hysterectomy    . Hand surgery    . Feet surgery    . Shoulder arthroscopy    . Back surgery    . Tonsillectomy    . Eye surgery      cataract removed and eye lids lifted  . Fracture surgery      bilateral arms  . Tubal ligation    . Colonoscopy w/ polypectomy      Family History  Problem Relation Age of Onset  . Heart disease Mother   . Heart disease Father   . Heart disease Other   . Birth defects Other     History  Substance Use Topics  . Smoking status: Former Smoker -- 0.20 packs/day for 1 years    Types: Cigarettes    Quit date: 06/02/1941  . Smokeless tobacco: Never Used  . Alcohol Use: No    OB History   Grav Para Term Preterm Abortions TAB SAB Ect Mult Living                   Review of Systems  Unable to perform ROS: Dementia  Gastrointestinal:       Black stools  Musculoskeletal: Positive for back pain.  Neurological: Positive for headaches.    Allergies  Codeine and Hydrocodone  Home Medications   Current Outpatient Rx  Name  Route  Sig  Dispense  Refill  . benzonatate (TESSALON) 100 MG capsule   Oral   Take 100 mg by mouth 3 (three) times daily as needed for cough.         . hydrochlorothiazide (MICROZIDE) 12.5 MG capsule   Oral   Take 25 mg by mouth daily.         Marland Kitchen levothyroxine (SYNTHROID, LEVOTHROID) 50 MCG tablet   Oral   Take 50 mcg by mouth daily.         Marland Kitchen LORazepam (ATIVAN) 1 MG tablet   Oral   Take 1 mg by mouth at bedtime as needed (for anxiety/sleep).         . Magnesium 500 MG TABS   Oral   Take 1 tablet by mouth at bedtime.         Marland Kitchen  rOPINIRole (REQUIP) 1 MG tablet   Oral   Take 1 mg by mouth at bedtime.         . methocarbamol (ROBAXIN) 500 MG tablet   Oral   Take 500 mg by mouth 2 (two) times daily as needed (for muscle spasms).           BP 151/54  Pulse 62  Temp(Src) 98.7 F (37.1 C) (Oral)  Resp 18  SpO2 95%  Physical Exam  Nursing note and vitals reviewed. Constitutional: She appears well-developed and well-nourished.  HENT:  Head: Normocephalic and atraumatic.  Eyes: Conjunctivae are normal. Pupils are equal, round, and reactive to light.  Neck: Neck supple. No tracheal deviation present. No thyromegaly present.  Cardiovascular: Normal rate and regular rhythm.   No murmur heard. Pulmonary/Chest: Effort normal and breath sounds normal.  Abdominal: Soft. Bowel sounds are normal. She exhibits no distension. There is no tenderness.  Genitourinary: Guaiac negative stool.  Rectal normal tone brown stool  Musculoskeletal: Normal range of motion. She exhibits no edema and no tenderness.  Neurological: She is alert. Coordination normal.  Skin: Skin is warm and dry. No rash noted.   Psychiatric: She has a normal mood and affect.    ED Course  Procedures (including critical care time)  Labs Reviewed  COMPREHENSIVE METABOLIC PANEL - Abnormal; Notable for the following:    Glucose, Bld 100 (*)    GFR calc non Af Amer 55 (*)    GFR calc Af Amer 64 (*)    All other components within normal limits  CBC WITH DIFFERENTIAL  OCCULT BLOOD, POC DEVICE   No results found.   No diagnosis found.  Patient received Tylenol for pain. At 11:30 PM she is alert states pain is improved Results for orders placed during the hospital encounter of 08/13/12  CBC WITH DIFFERENTIAL      Result Value Range   WBC 7.8  4.0 - 10.5 K/uL   RBC 4.26  3.87 - 5.11 MIL/uL   Hemoglobin 12.5  12.0 - 15.0 g/dL   HCT 62.1  30.8 - 65.7 %   MCV 84.5  78.0 - 100.0 fL   MCH 29.3  26.0 - 34.0 pg   MCHC 34.7  30.0 - 36.0 g/dL   RDW 84.6  96.2 - 95.2 %   Platelets 265  150 - 400 K/uL   Neutrophils Relative 62  43 - 77 %   Neutro Abs 4.8  1.7 - 7.7 K/uL   Lymphocytes Relative 31  12 - 46 %   Lymphs Abs 2.4  0.7 - 4.0 K/uL   Monocytes Relative 6  3 - 12 %   Monocytes Absolute 0.5  0.1 - 1.0 K/uL   Eosinophils Relative 1  0 - 5 %   Eosinophils Absolute 0.1  0.0 - 0.7 K/uL   Basophils Relative 0  0 - 1 %   Basophils Absolute 0.0  0.0 - 0.1 K/uL  COMPREHENSIVE METABOLIC PANEL      Result Value Range   Sodium 141  135 - 145 mEq/L   Potassium 3.7  3.5 - 5.1 mEq/L   Chloride 102  96 - 112 mEq/L   CO2 28  19 - 32 mEq/L   Glucose, Bld 100 (*) 70 - 99 mg/dL   BUN 15  6 - 23 mg/dL   Creatinine, Ser 8.41  0.50 - 1.10 mg/dL   Calcium 9.3  8.4 - 32.4 mg/dL   Total Protein 7.0  6.0 -  8.3 g/dL   Albumin 3.5  3.5 - 5.2 g/dL   AST 21  0 - 37 U/L   ALT 17  0 - 35 U/L   Alkaline Phosphatase 82  39 - 117 U/L   Total Bilirubin 0.4  0.3 - 1.2 mg/dL   GFR calc non Af Amer 55 (*) >90 mL/min   GFR calc Af Amer 64 (*) >90 mL/min  OCCULT BLOOD, POC DEVICE      Result Value Range   Fecal Occult Bld NEGATIVE   NEGATIVE   No results found.  MDM  There is no evidence of rectal bleeding Plan followup with PMD Dr.Hodgin as needed Dx #1 nonspecific headache #2 back pain        Doug Sou, MD 08/13/12 2335

## 2012-08-13 NOTE — ED Notes (Signed)
Called Dr. Loreta Ave - She wants our EDP to evaluate pt, etc. She will be in shortly to follow-up for possible admission.

## 2012-08-13 NOTE — ED Notes (Addendum)
C/o lower back pain x 1 1/2 hours.  Reports charcoal black stool 1 hour ago.  Pt states she had 6-7 polyps removed this afternoon at Dr. Kenna Gilbert office.  Dr. Loreta Ave called and wants EDP to eval for Post-Polypectomy Bleed.

## 2012-08-14 NOTE — ED Notes (Signed)
Discharge paperwork given to son, POA; POA verbalized understanding of d/c and f/u; no additional question by POA regarding d/c; VSS; resps e/u; e-signature obtained;

## 2012-08-15 ENCOUNTER — Encounter (HOSPITAL_COMMUNITY): Payer: Self-pay

## 2012-08-15 ENCOUNTER — Emergency Department (HOSPITAL_COMMUNITY): Payer: Medicare Other

## 2012-08-15 ENCOUNTER — Inpatient Hospital Stay (HOSPITAL_COMMUNITY)
Admission: EM | Admit: 2012-08-15 | Discharge: 2012-08-17 | DRG: 641 | Disposition: A | Discharge status: Home / Self Care | Payer: Medicare Other | Attending: Internal Medicine | Admitting: Internal Medicine

## 2012-08-15 DIAGNOSIS — Z90710 Acquired absence of both cervix and uterus: Secondary | ICD-10-CM

## 2012-08-15 DIAGNOSIS — I519 Heart disease, unspecified: Secondary | ICD-10-CM | POA: Diagnosis present

## 2012-08-15 DIAGNOSIS — G9340 Encephalopathy, unspecified: Secondary | ICD-10-CM | POA: Diagnosis present

## 2012-08-15 DIAGNOSIS — F411 Generalized anxiety disorder: Secondary | ICD-10-CM | POA: Diagnosis present

## 2012-08-15 DIAGNOSIS — F3289 Other specified depressive episodes: Secondary | ICD-10-CM | POA: Diagnosis present

## 2012-08-15 DIAGNOSIS — Y92009 Unspecified place in unspecified non-institutional (private) residence as the place of occurrence of the external cause: Secondary | ICD-10-CM

## 2012-08-15 DIAGNOSIS — N189 Chronic kidney disease, unspecified: Secondary | ICD-10-CM | POA: Diagnosis present

## 2012-08-15 DIAGNOSIS — D649 Anemia, unspecified: Secondary | ICD-10-CM | POA: Diagnosis present

## 2012-08-15 DIAGNOSIS — Z87442 Personal history of urinary calculi: Secondary | ICD-10-CM

## 2012-08-15 DIAGNOSIS — IMO0002 Reserved for concepts with insufficient information to code with codable children: Secondary | ICD-10-CM | POA: Diagnosis present

## 2012-08-15 DIAGNOSIS — I10 Essential (primary) hypertension: Secondary | ICD-10-CM

## 2012-08-15 DIAGNOSIS — G8929 Other chronic pain: Secondary | ICD-10-CM | POA: Diagnosis present

## 2012-08-15 DIAGNOSIS — Y849 Medical procedure, unspecified as the cause of abnormal reaction of the patient, or of later complication, without mention of misadventure at the time of the procedure: Secondary | ICD-10-CM | POA: Diagnosis present

## 2012-08-15 DIAGNOSIS — F0390 Unspecified dementia without behavioral disturbance: Secondary | ICD-10-CM | POA: Diagnosis present

## 2012-08-15 DIAGNOSIS — R55 Syncope and collapse: Secondary | ICD-10-CM | POA: Diagnosis present

## 2012-08-15 DIAGNOSIS — F329 Major depressive disorder, single episode, unspecified: Secondary | ICD-10-CM | POA: Diagnosis present

## 2012-08-15 DIAGNOSIS — Z87891 Personal history of nicotine dependence: Secondary | ICD-10-CM

## 2012-08-15 DIAGNOSIS — G2581 Restless legs syndrome: Secondary | ICD-10-CM | POA: Diagnosis present

## 2012-08-15 DIAGNOSIS — Z860100 Personal history of colon polyps, unspecified: Secondary | ICD-10-CM

## 2012-08-15 DIAGNOSIS — M549 Dorsalgia, unspecified: Secondary | ICD-10-CM | POA: Diagnosis present

## 2012-08-15 DIAGNOSIS — Z23 Encounter for immunization: Secondary | ICD-10-CM

## 2012-08-15 DIAGNOSIS — I129 Hypertensive chronic kidney disease with stage 1 through stage 4 chronic kidney disease, or unspecified chronic kidney disease: Secondary | ICD-10-CM | POA: Diagnosis present

## 2012-08-15 DIAGNOSIS — E039 Hypothyroidism, unspecified: Secondary | ICD-10-CM | POA: Diagnosis present

## 2012-08-15 DIAGNOSIS — E860 Dehydration: Principal | ICD-10-CM | POA: Diagnosis present

## 2012-08-15 DIAGNOSIS — Z885 Allergy status to narcotic agent status: Secondary | ICD-10-CM

## 2012-08-15 DIAGNOSIS — Z79899 Other long term (current) drug therapy: Secondary | ICD-10-CM

## 2012-08-15 DIAGNOSIS — R4182 Altered mental status, unspecified: Secondary | ICD-10-CM

## 2012-08-15 DIAGNOSIS — Z9089 Acquired absence of other organs: Secondary | ICD-10-CM

## 2012-08-15 DIAGNOSIS — R413 Other amnesia: Secondary | ICD-10-CM | POA: Diagnosis present

## 2012-08-15 DIAGNOSIS — I498 Other specified cardiac arrhythmias: Secondary | ICD-10-CM | POA: Diagnosis present

## 2012-08-15 DIAGNOSIS — R011 Cardiac murmur, unspecified: Secondary | ICD-10-CM | POA: Diagnosis present

## 2012-08-15 DIAGNOSIS — I959 Hypotension, unspecified: Secondary | ICD-10-CM | POA: Diagnosis present

## 2012-08-15 LAB — COMPREHENSIVE METABOLIC PANEL
ALT: 20 U/L (ref 0–35)
AST: 23 U/L (ref 0–37)
Albumin: 3.8 g/dL (ref 3.5–5.2)
CO2: 28 mEq/L (ref 19–32)
Chloride: 102 mEq/L (ref 96–112)
GFR calc non Af Amer: 52 mL/min — ABNORMAL LOW (ref >90)
Potassium: 3.6 mEq/L (ref 3.5–5.1)
Sodium: 140 mEq/L (ref 135–145)
Total Bilirubin: 0.5 mg/dL (ref 0.3–1.2)

## 2012-08-15 LAB — CBC WITH DIFFERENTIAL/PLATELET
Basophils Absolute: 0 10*3/uL (ref 0.0–0.1)
Basophils Relative: 0 % (ref 0–1)
HCT: 36.6 % (ref 36.0–46.0)
Lymphocytes Relative: 21 % (ref 12–46)
MCHC: 35 g/dL (ref 30.0–36.0)
Neutro Abs: 7.6 10*3/uL (ref 1.7–7.7)
Neutrophils Relative %: 74 % (ref 43–77)
Platelets: 235 10*3/uL (ref 150–400)
RDW: 12.9 % (ref 11.5–15.5)
WBC: 10.3 10*3/uL (ref 4.0–10.5)

## 2012-08-15 LAB — POCT I-STAT TROPONIN I: Troponin i, poc: 0 ng/mL (ref 0.00–0.08)

## 2012-08-15 LAB — GLUCOSE, CAPILLARY: Glucose-Capillary: 91 mg/dL (ref 70–99)

## 2012-08-15 MED ORDER — HYDROCODONE-ACETAMINOPHEN 5-325 MG PO TABS: 1.0000 | ORAL_TABLET | ORAL | Status: DC | PRN

## 2012-08-15 MED ORDER — MAGNESIUM 500 MG PO TABS: 1.0000 | ORAL_TABLET | Freq: Every day | ORAL | Status: DC

## 2012-08-15 MED ORDER — HYDROCODONE-ACETAMINOPHEN 5-325 MG PO TABS
1.0000 | ORAL_TABLET | ORAL | Status: DC | PRN
  Administered 2012-08-15: 1 via ORAL
  Administered 2012-08-16: 2 via ORAL
  Filled 2012-08-15: qty 1
  Filled 2012-08-15: qty 2

## 2012-08-15 MED ORDER — ONDANSETRON HCL 4 MG/2ML IJ SOLN: 4.0000 mg | Freq: Four times a day (QID) | INTRAMUSCULAR | Status: DC | PRN

## 2012-08-15 MED ORDER — ONDANSETRON HCL 4 MG PO TABS: 4.0000 mg | ORAL_TABLET | Freq: Four times a day (QID) | ORAL | Status: DC | PRN

## 2012-08-15 MED ORDER — LEVOTHYROXINE SODIUM 50 MCG PO TABS
50.0000 ug | ORAL_TABLET | Freq: Every day | ORAL | Status: DC
  Administered 2012-08-16 – 2012-08-17 (×2): 50 ug via ORAL
  Filled 2012-08-15 (×3): qty 1

## 2012-08-15 MED ORDER — SODIUM CHLORIDE 0.9 % IV SOLN
1000.0000 mL | INTRAVENOUS | Status: DC
  Administered 2012-08-15: 1000 mL via INTRAVENOUS

## 2012-08-15 MED ORDER — METHOCARBAMOL 500 MG PO TABS
500.0000 mg | ORAL_TABLET | Freq: Two times a day (BID) | ORAL | Status: DC | PRN
Start: 2012-08-15 — End: 2012-08-17
  Filled 2012-08-15: qty 1

## 2012-08-15 MED ORDER — HYDROCHLOROTHIAZIDE 25 MG PO TABS
25.0000 mg | ORAL_TABLET | Freq: Every day | ORAL | Status: DC
  Filled 2012-08-15: qty 1

## 2012-08-15 MED ORDER — MAGNESIUM OXIDE 400 (241.3 MG) MG PO TABS
400.0000 mg | ORAL_TABLET | Freq: Every day | ORAL | Status: DC
  Administered 2012-08-15: 400 mg via ORAL
  Filled 2012-08-15 (×2): qty 1

## 2012-08-15 MED ORDER — SODIUM CHLORIDE 0.9 % IV SOLN
1000.0000 mL | Freq: Once | INTRAVENOUS | Status: AC
  Administered 2012-08-15: 1000 mL via INTRAVENOUS

## 2012-08-15 MED ORDER — BENZONATATE 100 MG PO CAPS
100.0000 mg | ORAL_CAPSULE | Freq: Three times a day (TID) | ORAL | Status: DC | PRN
  Filled 2012-08-15: qty 1

## 2012-08-15 MED ORDER — HYDROCHLOROTHIAZIDE 12.5 MG PO CAPS: 25.0000 mg | ORAL_CAPSULE | Freq: Every day | ORAL | Status: DC

## 2012-08-15 MED ORDER — SODIUM CHLORIDE 0.9 % IV SOLN
INTRAVENOUS | Status: DC
  Administered 2012-08-15 – 2012-08-16 (×3): via INTRAVENOUS

## 2012-08-15 MED ORDER — SODIUM CHLORIDE 0.9 % IV SOLN: INTRAVENOUS | Status: AC

## 2012-08-15 MED ORDER — ROPINIROLE HCL 1 MG PO TABS
1.0000 mg | ORAL_TABLET | Freq: Every day | ORAL | Status: DC
  Administered 2012-08-15 – 2012-08-16 (×2): 1 mg via ORAL
  Filled 2012-08-15 (×3): qty 1

## 2012-08-15 NOTE — ED Notes (Signed)
Per EMS, pt from home and was getting ready for church when she had an unwitnessed syncopal episode.  Pt states she "blacked out".  Family states pt was on floor.  Per EMS, pt got up and walked to chair on her own.  Pt seen recently for headache.  Pt CBG 102.  VS normal.  EMS reports a negative stroke scale.

## 2012-08-15 NOTE — H&P (Signed)
[facility] History and Physical  [name]:[id number] DOB: Jun 28, 1920 DOA: 08/15/2012  Referring physician: ER physician PCP: Letitia Libra, Ala Dach, MD   Chief Complaint: altered mental status  HPI:  79 year old female with history of hypothyroidism, HTN, dementia who presented to [facility] ED status post syncopal episode at home on the day of this admission. This was an un witnessed event but what patient reported was that she woke up on the floor. She does not recall if she has had prodromal symptoms prior to the fall. No reports of chest pain, no shortness of breath, no palpitations. No fevers, no chills. No abdominal pain, no nausea or vomiting. No reports of diarrhea or constipation. Patient's family is not at the bedside at the moment to provide details of other medical history but per ED physician they have reported increasing confusion and mental status changes started recently. Patient lives by herself at home and family thinks she may need assisted living placement for which she was admitted.  In ED, evaluation included CT head which was negative. CXR did not show acute cardiopulmonary process. Her CBC and BMET was unremarkable.  Assessment and Plan:  Principal Problem:   *Syncope  Possibly vasovagal, dehydration although vital signs were stable on admission.  CT head was negative  Will continue IV fluids for now, diet as tolerated  PT/OT evaluation Active Problems:   Hypertension  Continue  Hczt   Hypothyroidism  Continue levothyroxine   Restless leg syndrome  Continue  requip  Code Status: Full Family Communication: Pt at bedside Disposition Plan: Admit for further evaluation  Manson Passey, MD  [facility] Pager [phone]  If 7PM-7AM, please contact night-coverage [email or web address] Password [email or web address] 08/15/2012, 3:22 PM   Review of Systems:  Constitutional: Negative for fever, chills and malaise/fatigue. Negative for diaphoresis.  HENT: Negative for hearing  loss, ear pain, nosebleeds, congestion, sore throat, neck pain, tinnitus and ear discharge.   Eyes: Negative for blurred vision, double vision, photophobia, pain, discharge and redness.  Respiratory: Negative for cough, hemoptysis, sputum production, shortness of breath, wheezing and stridor.   Cardiovascular: Negative for chest pain, palpitations, orthopnea, claudication and leg swelling.  Gastrointestinal: Negative for nausea, vomiting and abdominal pain. Negative for heartburn, constipation, blood in stool and melena.  Genitourinary: Negative for dysuria, urgency, frequency, hematuria and flank pain.  Musculoskeletal: Negative for myalgias, back pain, joint pain and falls.  Skin: Negative for itching and rash.  Neurological: confusion, mental status changes; loss of consciousness Endo/Heme/Allergies: Negative for environmental allergies and polydipsia. Does not bruise/bleed easily.  Psychiatric/Behavioral: Negative for suicidal ideas. The patient is not nervous/anxious.      Past Medical History  Diagnosis Date  . Arthritis   . Chicken pox   . Chronic kidney disease   . Colon polyps   . Hypertension   . Dementia   . Angina   . Dysrhythmia   . Heart murmur   . Shortness of breath   . Recurrent upper respiratory infection (URI)   . Pneumonia   . Hypothyroidism   . Anemia   . Headache   . Anxiety   . Depression    Past Surgical History  Procedure Laterality Date  . Appendectomy    . Cholecystectomy    . Kidney stones    . Tonsillectomy    . Abdominal hysterectomy    . Hand surgery    . Feet surgery    . Shoulder arthroscopy    . Back surgery    .  Tonsillectomy    . Eye surgery      cataract removed and eye lids lifted  . Fracture surgery      bilateral arms  . Tubal ligation    . Colonoscopy w/ polypectomy     Social History:  reports that she quit smoking about 71 years ago. Her smoking use included Cigarettes. She has a .2 pack-year smoking history. She has never  used smokeless tobacco. She reports that she does not drink alcohol or use illicit drugs.  Allergies  Allergen Reactions  . Codeine Rash  . Hydrocodone Rash    Family History:  Family History  Problem Relation Age of Onset  . Heart disease Mother   . Heart disease Father   . Heart disease Other   . Birth defects Other      Prior to Admission medications   Medication Sig Start Date End Date Taking? Authorizing Provider  benzonatate (TESSALON) 100 MG capsule Take 100 mg by mouth 3 (three) times daily as needed for cough.   Yes Historical Provider, MD  hydrochlorothiazide (MICROZIDE) 12.5 MG capsule Take 25 mg by mouth daily.   Yes Historical Provider, MD  levothyroxine (SYNTHROID, LEVOTHROID) 50 MCG tablet Take 50 mcg by mouth daily.   Yes Historical Provider, MD  LORazepam (ATIVAN) 1 MG tablet Take 1 mg by mouth at bedtime as needed (for anxiety/sleep).   Yes Historical Provider, MD  Magnesium 500 MG TABS Take 1 tablet by mouth at bedtime.   Yes Historical Provider, MD  methocarbamol (ROBAXIN) 500 MG tablet Take 500 mg by mouth 2 (two) times daily as needed (for muscle spasms).   Yes Historical Provider, MD  rOPINIRole (REQUIP) 1 MG tablet Take 1 mg by mouth at bedtime.   Yes Historical Provider, MD   Physical Exam: Filed Vitals:   08/15/12 1110 08/15/12 1343 08/15/12 1345 08/15/12 1346  BP: 162/60 141/60 153/58 166/85  Pulse: 60 64 68 72  Temp: 97.9 F (36.6 C)     TempSrc: Oral     Resp: 20     SpO2: 98%       Physical Exam  Constitutional: Appears well-developed and well-nourished. No distress.  HENT: Normocephalic. External right and left ear normal. Oropharynx is clear and moist.  Eyes: Conjunctivae and EOM are normal. PERRLA, no scleral icterus.  Neck: Normal ROM. Neck supple. No JVD. No tracheal deviation. No thyromegaly.  CVS: RRR, S1/S2 +, no murmurs, no gallops, no carotid bruit.  Pulmonary: Effort and breath sounds normal, no stridor, rhonchi, wheezes, rales.   Abdominal: Soft. BS +,  no distension, tenderness, rebound or guarding.  Musculoskeletal: Normal range of motion. No edema and no tenderness.  Lymphadenopathy: No lymphadenopathy noted, cervical, inguinal. Neuro: Alert. Normal reflexes, muscle tone coordination. No cranial nerve deficit. Skin: Skin is warm and dry. No rash noted. Not diaphoretic. No erythema. No pallor.   Labs on Admission:  Basic Metabolic Panel:  Recent Labs Lab 08/13/12 2158 08/15/12 1212  NA 141 140  K 3.7 3.6  CL 102 102  CO2 28 28  GLUCOSE 100* 105*  BUN 15 12  CREATININE 0.90 0.94  CALCIUM 9.3 9.6   Liver Function Tests:  Recent Labs Lab 08/13/12 2158 08/15/12 1212  AST 21 23  ALT 17 20  ALKPHOS 82 84  BILITOT 0.4 0.5  PROT 7.0 7.3  ALBUMIN 3.5 3.8   No results found for this basename: LIPASE, AMYLASE,  in the last 168 hours No results found for this basename:  AMMONIA,  in the last 168 hours CBC:  Recent Labs Lab 08/13/12 2158 08/15/12 1212  WBC 7.8 10.3  NEUTROABS 4.8 7.6  HGB 12.5 12.8  HCT 36.0 36.6  MCV 84.5 85.1  PLT 265 235   Cardiac Enzymes: No results found for this basename: CKTOTAL, CKMB, CKMBINDEX, TROPONINI,  in the last 168 hours BNP: No components found with this basename: POCBNP,  CBG: No results found for this basename: GLUCAP,  in the last 168 hours  Radiological Exams on Admission: Dg Chest 2 View 08/15/2012  *  IMPRESSION: No active cardiopulmonary disease.    Ct Head Wo Contrast 08/15/2012  *  IMPRESSION: No evidence of acute intracranial abnormality.  Mild chronic small vessel white matter ischemic changes.  Mild left sphenoid sinus disease/sinusitis.     EKG: Normal sinus rhythm, no ST/T wave changes  Time spent: 65 minutes

## 2012-08-15 NOTE — ED Provider Notes (Signed)
History     CSN: [id number]  Arrival date & time 08/15/12  1056   First MD Initiated Contact with Patient 08/15/12 1202      Chief Complaint  Patient presents with  . Loss of Consciousness    (Consider location/radiation/quality/duration/timing/severity/associated sxs/prior treatment) HPI Comments: [name] is a 79 y.o. female w a hx of heart murmur, HTN, Dementia & CKD presents to the ER s/p syncopal episode. Pt reports that she was getting ready for [facility] today and went to her closet to get her shoes and woke up on the floor of her closet. Pt states she was feeling fine this morning, but that she has had some black stools since her colonoscopy. After the fall pt called her son after the fall and stated she had fallen and didn't have any idea of where she was. He reports that the pt was making since, but she was slurred. Since the incident pt reports that she has had a HA. She denies any light headedness, CP, SOB, increased bruising, hematochezia, abdominal pain, neck pain, back pain, change in vision, or atxaia. She reports cough x 3 weeks, productive that has not been evaluated. Pt currently lives on her own in an apartment and ambulates with a cane. Son reports she drives as well. He states that this is not her baseline and he would not be comfortable with her going home to care for herself in the altered state she is in currently.   PCP: Dr. Rodena Medin, referred to GI for blood in stools. Gastroenterology: Dr. Loreta Ave- Colonoscopy, found multiple polyps but no source of bleed.   Patient is a 79 y.o. female presenting with syncope. The history is provided by the patient.  Loss of Consciousness  Associated symptoms include headaches.    Past Medical History  Diagnosis Date  . Arthritis   . Chicken pox   . Chronic kidney disease   . Colon polyps   . Hypertension   . Dementia   . Angina   . Dysrhythmia   . Heart murmur   . Shortness of breath   . Recurrent upper respiratory  infection (URI)   . Pneumonia   . Hypothyroidism   . Anemia   . Headache   . Anxiety   . Depression     Past Surgical History  Procedure Laterality Date  . Appendectomy    . Cholecystectomy    . Kidney stones    . Tonsillectomy    . Abdominal hysterectomy    . Hand surgery    . Feet surgery    . Shoulder arthroscopy    . Back surgery    . Tonsillectomy    . Eye surgery      cataract removed and eye lids lifted  . Fracture surgery      bilateral arms  . Tubal ligation    . Colonoscopy w/ polypectomy      Family History  Problem Relation Age of Onset  . Heart disease Mother   . Heart disease Father   . Heart disease Other   . Birth defects Other     History  Substance Use Topics  . Smoking status: Former Smoker -- 0.20 packs/day for 1 years    Types: Cigarettes    Quit date: 06/02/1941  . Smokeless tobacco: Never Used  . Alcohol Use: No    OB History   Grav Para Term Preterm Abortions TAB SAB Ect Mult Living  Review of Systems  Unable to perform ROS: Mental status change  Cardiovascular: Positive for syncope.  Neurological: Positive for headaches.    Allergies  Codeine and Hydrocodone  Home Medications   Current Outpatient Rx  Name  Route  Sig  Dispense  Refill  . benzonatate (TESSALON) 100 MG capsule   Oral   Take 100 mg by mouth 3 (three) times daily as needed for cough.         . hydrochlorothiazide (MICROZIDE) 12.5 MG capsule   Oral   Take 25 mg by mouth daily.         Marland Kitchen levothyroxine (SYNTHROID, LEVOTHROID) 50 MCG tablet   Oral   Take 50 mcg by mouth daily.         Marland Kitchen LORazepam (ATIVAN) 1 MG tablet   Oral   Take 1 mg by mouth at bedtime as needed (for anxiety/sleep).         . Magnesium 500 MG TABS   Oral   Take 1 tablet by mouth at bedtime.         . methocarbamol (ROBAXIN) 500 MG tablet   Oral   Take 500 mg by mouth 2 (two) times daily as needed (for muscle spasms).         Marland Kitchen rOPINIRole (REQUIP) 1  MG tablet   Oral   Take 1 mg by mouth at bedtime.           BP 166/85  Pulse 72  Temp(Src) 97.9 F (36.6 C) (Oral)  Resp 20  SpO2 98%  Physical Exam  Nursing note and vitals reviewed. Constitutional: She is oriented to person, place, and time. She appears well-developed and well-nourished. No distress.  Patient not in visible distress  HENT:  Head: Normocephalic and atraumatic.  Eyes: Conjunctivae and EOM are normal. Pupils are equal, round, and reactive to light.  Neck: Normal range of motion. Neck supple. Normal carotid pulses, no hepatojugular reflux and no JVD present. Carotid bruit is not present.  No carotid bruits  Cardiovascular:  RRR, 2/6 systolic ejection murmur, intact distal pulses no pitting edema bilaterally.  Pulmonary/Chest: Effort normal and breath sounds normal. No respiratory distress.  Lungs clear to auscultation bilaterally  Abdominal: Soft. She exhibits no distension. There is no tenderness.  Nonpulsatile aorta  Genitourinary: Guaiac negative stool.  Exam chaperoned. External hemorrhoids, not incarcerated or bleeding. No gross blood on digital exam. Stool brown.   Musculoskeletal: Normal range of motion. She exhibits no tenderness.  Neurological: She is alert and oriented to person, place, and time. She displays a negative Romberg sign.  Cranial nerves III through XII intact, normal coordination, negative Romberg, strength 5/5 bilaterally. No ataxia  Skin: Skin is warm and dry. No pallor.  Psychiatric: She has a normal mood and affect. Her behavior is normal. Cognition and memory are impaired.    ED Course  Procedures (including critical care time)  Labs Reviewed  COMPREHENSIVE METABOLIC PANEL - Abnormal; Notable for the following:    Glucose, Bld 105 (*)    GFR calc non Af Amer 52 (*)    GFR calc Af Amer 61 (*)    All other components within normal limits  CBC WITH DIFFERENTIAL  URINALYSIS, ROUTINE W REFLEX MICROSCOPIC  POCT I-STAT TROPONIN I   POCT CBG (FASTING - GLUCOSE)-MANUAL ENTRY   Dg Chest 2 View  08/15/2012  *RADIOLOGY REPORT*  Clinical Data: Fall. Syncope.  Hypertension.  CHEST - 2 VIEW  Comparison:  09/06/2011  Findings:  The heart  size and mediastinal contours are within normal limits.  Both lungs are clear.  The visualized skeletal structures are unremarkable.  IMPRESSION: No active cardiopulmonary disease.   Original Report Authenticated By: Myles Rosenthal, M.D.    Ct Head Wo Contrast  08/15/2012  *RADIOLOGY REPORT*  Clinical Data: 79 year old female with syncope and headache.  CT HEAD WITHOUT CONTRAST  Technique:  Contiguous axial images were obtained from the base of the skull through the vertex without contrast.  Comparison: 09/06/2011 and prior CTs dating back to 11/22/2004.  Findings: Mild generalized cerebral and cerebellar volume loss and chronic small vessel white matter ischemic changes again noted.  No acute intracranial abnormalities are identified, including mass lesion or mass effect, hydrocephalus, extra-axial fluid collection, midline shift, hemorrhage, or acute infarction.  The visualized bony calvarium is unremarkable. A small amount of mucus/fluid within the left sphenoid sinus noted.  IMPRESSION: No evidence of acute intracranial abnormality.  Mild chronic small vessel white matter ischemic changes.  Mild left sphenoid sinus disease/sinusitis.   Original Report Authenticated By: Harmon Pier, M.D.      No diagnosis found.  Date: 08/15/2012  Rate: 58  Rhythm: normal sinus rhythm  QRS Axis: normal  Intervals: normal  ST/T Wave abnormalities: normal  Conduction Disutrbances:right bundle branch block and nonspecific intraventricular conduction delay  Narrative Interpretation:   Old EKG Reviewed: unchanged   MDM  Syncope, Alt mental Status  A 79 year old female presented to the emergency department today status post a syncopal event this morning with family report of patient having altered mental status.   There is to be a low concern for patient living alone and being unable to care for herself.  Patient with recent GI evaluation performed by Dr. Loreta Ave with colonoscopy on Friday with no evidence of bleed, otherwise findings of polyps.  Today patient's hemoglobin is normal with no new changes on ECG, but troponin, normal chest x-ray and no acute intracranial abnormalities.  Patient is to be admitted for observation and to help set up a more appropriate home living situation.  At this time there is no clear etiology of patient's acute change in mental state or syncopal episode.  Case discussed and seen with attending Dr. Effie Shy who agrees with disposition plan. The patient appears reasonably stabilized for admission considering the current resources, flow, and capabilities available in the ED at this time, and I doubt any other [facility] requiring further screening and/or treatment in the ED prior to admission.        Jaci Carrel, [place] 08/15/12 [phone]

## 2012-08-15 NOTE — ED Notes (Signed)
Pt returns from xray, family at bedside 

## 2012-08-15 NOTE — ED Provider Notes (Signed)
Nontoxic female, who is alert and cooperative. She is debilitated.  During exam, she is calm and comfortable. Vital signs are reassuring.   Medical screening examination/treatment/procedure(s) were conducted as a shared visit with non-physician practitioner(s) and myself.  I personally evaluated the patient during the encounter  Flint Melter, MD 08/15/12 [phone]

## 2012-08-16 DIAGNOSIS — I359 Nonrheumatic aortic valve disorder, unspecified: Secondary | ICD-10-CM

## 2012-08-16 DIAGNOSIS — G2581 Restless legs syndrome: Secondary | ICD-10-CM

## 2012-08-16 DIAGNOSIS — R55 Syncope and collapse: Secondary | ICD-10-CM

## 2012-08-16 LAB — CBC
Hemoglobin: 11.7 g/dL — ABNORMAL LOW (ref 12.0–15.0)
MCHC: 34.5 g/dL (ref 30.0–36.0)
MCV: 83.8 fL (ref 78.0–100.0)
Platelets: 230 10*3/uL (ref 150–400)
RBC: 4.01 MIL/uL (ref 3.87–5.11)
RDW: 12.9 % (ref 11.5–15.5)
WBC: 7.7 10*3/uL (ref 4.0–10.5)

## 2012-08-16 LAB — COMPREHENSIVE METABOLIC PANEL
Albumin: 3.1 g/dL — ABNORMAL LOW (ref 3.5–5.2)
BUN: 14 mg/dL (ref 6–23)
Chloride: 105 mEq/L (ref 96–112)
Creatinine, Ser: 0.95 mg/dL (ref 0.50–1.10)
Total Bilirubin: 0.7 mg/dL (ref 0.3–1.2)

## 2012-08-16 LAB — OCCULT BLOOD, POC DEVICE: Fecal Occult Bld: NEGATIVE

## 2012-08-16 LAB — GLUCOSE, CAPILLARY: Glucose-Capillary: 92 mg/dL (ref 70–99)

## 2012-08-16 MED ORDER — ACETAMINOPHEN 325 MG PO TABS
650.0000 mg | ORAL_TABLET | Freq: Four times a day (QID) | ORAL | Status: DC | PRN
  Filled 2012-08-16: qty 2

## 2012-08-16 MED ORDER — TRAMADOL HCL 50 MG PO TABS
50.0000 mg | ORAL_TABLET | Freq: Four times a day (QID) | ORAL | Status: DC | PRN
  Filled 2012-08-16: qty 1

## 2012-08-16 MED ORDER — HYDRALAZINE HCL 20 MG/ML IJ SOLN: 10.0000 mg | Freq: Four times a day (QID) | INTRAMUSCULAR | Status: DC | PRN

## 2012-08-16 NOTE — Evaluation (Signed)
Occupational Therapy Evaluation Patient Details Name: [name] MRN: [id number] DOB: 10-25-1920 Today's Date: 08/16/2012 Time: 4098-1191 OT Time Calculation (min): 24 min  OT Assessment / Plan / Recommendation Clinical Impression  This 79 yo admitted post syncopal episode presents to acute OT with problems below. Will benefit from acute OT without need for follow up.    OT Assessment  Patient needs continued OT Services    Follow Up Recommendations  No OT follow up    Barriers to Discharge None    Equipment Recommendations  None recommended by OT       Frequency  Min 2X/week    Precautions / Restrictions Precautions Precautions: Fall Restrictions Weight Bearing Restrictions: No   Pertinent Vitals/Pain 5/10 low back pain    ADL  Eating/Feeding: Simulated;Independent Where Assessed - Eating/Feeding: Bed level Grooming: Simulated;Set up;Supervision/safety Where Assessed - Grooming: Unsupported sitting Upper Body Bathing: Simulated;Set up;Supervision/safety Where Assessed - Upper Body Bathing: Unsupported sitting Lower Body Bathing: Simulated;Set up;Supervision/safety Where Assessed - Lower Body Bathing: Unsupported sit to stand Upper Body Dressing: Simulated;Supervision/safety;Set up Where Assessed - Upper Body Dressing: Unsupported sitting Lower Body Dressing: Simulated;Supervision/safety;Set up Where Assessed - Lower Body Dressing: Unsupported sit to stand Toilet Transfer: Simulated;Min guard Toilet Transfer Method: Sit to Barista:  (Bed around to recliner) Toileting - Clothing Manipulation and Hygiene: Simulated;Supervision/safety Where Assessed - Engineer, mining and Hygiene: Standing Equipment Used:  (None) Transfers/Ambulation Related to ADLs: S sit to stand and stand to sit, min guard A amublation without AD ADL Comments: Can cross legs to get to feet, does cause back pain    OT Diagnosis: Acute pain  OT Problem  List: Pain OT Treatment Interventions: Self-care/ADL training;Patient/family education;DME and/or AE instruction   OT Goals Acute Rehab OT Goals OT Goal Formulation: With patient Time For Goal Achievement: 08/16/12 Potential to Achieve Goals: Good ADL Goals Pt Will Perform Grooming: Independently;Unsupported;Standing at sink ADL Goal: Grooming - Progress: Goal set today Pt Will Transfer to Toilet: with modified independence;with DME;Regular height toilet;Comfort height toilet;Grab bars;Ambulation ADL Goal: Toilet Transfer - Progress: Goal set today Pt Will Perform Toileting - Clothing Manipulation: Independently;Standing ADL Goal: Toileting - Clothing Manipulation - Progress: Goal set today Pt Will Perform Toileting - Hygiene: Independently;Sit to stand from 3-in-1/toilet ADL Goal: Toileting - Hygiene - Progress: Goal set today  Visit Information  Last OT Received On: 08/16/12 Assistance Needed: +1    Subjective Data  Subjective: I usually am a very active person Patient Stated Goal: To go home and be able to take care of myself   Prior Functioning     Home Living Lives With: Alone Available Help at Discharge: Friend(s);Available PRN/intermittently Type of Home: Apartment Home Access: Elevator Home Layout: One level Bathroom Shower/Tub: Walk-in Contractor: Standard Home Adaptive Equipment: Grab bars in shower;Grab bars around toilet;Walker - rolling;Straight cane;Shower chair with back;Hospital bed (hospital bed does not have rails) Prior Function Level of Independence: Independent Able to Take Stairs?: Yes Driving: Yes Vocation: Retired Comments: Is very active. She has a long time best friend and they travel together (last trip was by train to [place]) Communication Communication: No difficulties Dominant Hand: Right            Cognition  Cognition Overall Cognitive Status: Appears within functional limits for tasks  assessed/performed Arousal/Alertness: Awake/alert Orientation Level: Appears intact for tasks assessed Behavior During Session: [facility] for tasks performed    Extremity/Trunk Assessment Right Upper Extremity Assessment RUE ROM/Strength/Tone: Within functional levels  Left Upper Extremity Assessment LUE ROM/Strength/Tone: Within functional levels     Mobility Bed Mobility Bed Mobility: Supine to Sit;Sitting - Scoot to Edge of Bed Supine to Sit: 6: Modified independent (Device/Increase time);HOB elevated (40 degrees, pt has this ability at home) Sitting - Scoot to Edge of Bed: 7: Independent Transfers Transfers: Sit to Stand;Stand to Sit Sit to Stand: 5: Supervision;With upper extremity assist;From bed Stand to Sit: 5: Supervision;With upper extremity assist;With armrests;To chair/3-in-1           End of Session OT - End of Session Activity Tolerance: Patient tolerated treatment well Patient left: in chair;with call bell/phone within reach;with family/visitor present (best friend Johnny Bridge))       Evette Georges [phone] 08/16/2012, 11:13 AM

## 2012-08-16 NOTE — Evaluation (Signed)
Physical Therapy Evaluation Patient Details Name: [name] MRN: [id number] DOB: 05-27-1921 Today's Date: 08/16/2012 Time: 0865-7846 PT Time Calculation (min): 19 min  PT Assessment / Plan / Recommendation Clinical Impression  Pt adm after syncopal episode.  Pt very active and independent at home.  Currently pt limited by back pain.  Feel pt can return to her apt when medically ready.  Will follow acutely but no PT after dc recommended.    PT Assessment  Patient needs continued PT services    Follow Up Recommendations  No PT follow up    Does the patient have the potential to tolerate intense rehabilitation      Barriers to Discharge        Equipment Recommendations  None recommended by PT    Recommendations for Other Services     Frequency Min 3X/week    Precautions / Restrictions Precautions Precautions: Fall Restrictions Weight Bearing Restrictions: No   Pertinent Vitals/Pain Back pain 5/10.  Repositioned.      Mobility  Bed Mobility Bed Mobility: Supine to Sit;Sitting - Scoot to Edge of Bed Supine to Sit: 6: Modified independent (Device/Increase time);HOB elevated (40 degrees, pt has this ability at home) Sitting - Scoot to Edge of Bed: 7: Independent Transfers Sit to Stand: With upper extremity assist;With armrests;From chair/3-in-1;6: Modified independent (Device/Increase time) Stand to Sit: 6: Modified independent (Device/Increase time);With upper extremity assist;With armrests;To chair/3-in-1 Details for Transfer Assistance: Incr time and effort due to back pain. Ambulation/Gait Ambulation/Gait Assistance: 5: Supervision Ambulation Distance (Feet): 125 Feet (x 2) Assistive device: Other (Comment) (used wall rail occasionally) Gait Pattern: Step-through pattern;Decreased stride length;Antalgic Gait velocity: decr    Exercises     PT Diagnosis: Acute pain;Difficulty walking  PT Problem List: Decreased mobility;Pain PT Treatment Interventions: Gait  training;Functional mobility training;Therapeutic activities;Patient/family education   PT Goals Acute Rehab PT Goals PT Goal Formulation: With patient Time For Goal Achievement: 08/20/12 Potential to Achieve Goals: Good Pt will Ambulate: >150 feet;with modified independence;with least restrictive assistive device PT Goal: Ambulate - Progress: Goal set today  Visit Information  Last PT Received On: 08/16/12 Assistance Needed: +1    Subjective Data  Subjective: Pt states she is usually very active. Patient Stated Goal: Return home   Prior Functioning  Home Living Lives With: Alone Available Help at Discharge: Friend(s);Available PRN/intermittently Type of Home: Apartment Home Access: Elevator Home Layout: One level Bathroom Shower/Tub: Walk-in Contractor: Standard Home Adaptive Equipment: Grab bars in shower;Grab bars around toilet;Walker - rolling;Straight cane;Shower chair with back;Hospital bed (hospital bed does not have rails) Prior Function Level of Independence: Independent Able to Take Stairs?: Yes Driving: Yes Vocation: Retired Comments: Is very active. She has a long time best friend and they travel together (last trip was by train to [place]) Communication Communication: No difficulties Dominant Hand: Right    Cognition  Cognition Overall Cognitive Status: Appears within functional limits for tasks assessed/performed Arousal/Alertness: Awake/alert Orientation Level: Appears intact for tasks assessed Behavior During Session: [facility] for tasks performed    Extremity/Trunk Assessment Right Upper Extremity Assessment RUE ROM/Strength/Tone: Within functional levels Left Upper Extremity Assessment LUE ROM/Strength/Tone: Within functional levels Right Lower Extremity Assessment RLE ROM/Strength/Tone: Within functional levels Left Lower Extremity Assessment LLE ROM/Strength/Tone: Within functional levels   Balance Balance Balance Assessed:  Yes Static Standing Balance Static Standing - Balance Support: No upper extremity supported Static Standing - Level of Assistance: 6: Modified independent (Device/Increase time)  End of Session PT - End of Session Activity  Tolerance: Patient limited by pain Patient left: in chair;with call bell/phone within reach;with family/visitor present Nurse Communication: Mobility status  GP     [facility] 08/16/2012, 11:33 AM  Skip [name] PT [phone]

## 2012-08-16 NOTE — Progress Notes (Signed)
[facility] PROGRESS NOTE  [name] ZOX:[id number] DOB: [birth year] DOA: 08/15/2012 PCP: Estill Cotta, MD  Assessment/Plan:  Syncope 2D echo pending EKG no significantly changed from previous Likely secondary to vaso-vagel episode and dehydration. Monitor on telemetry Carefully re-hydrating with IVF.  Hold HCTZ for now. Physical Therapy Eval Pending.  Post Polypectomy Bleed Patient reports a large black stool 3/16 prior to coming to ED. Colonoscopy 3/15 with multiple large polyps removed.   Patient not on antiplatelet or anticoagulant. Dr. Loreta Ave aware and will see the patient today. (appreciate her consultation) Patient's Hgb is stable.  Will monitor CBC q 12 and place on Clear Liquids for now.  HTN Stable Stop HCTZ Will add hydralazine PRN SBP >180  Hypothyroid Continue Synthroid Will check TSH, Free T4  Restless Leg Continue Re-quip  Chronic Back Pain -no worrisome signs on exam -ambulated with PT -prn Tramadol    Code Status: full Family Communication:  Disposition Plan: inpatient   Consultants:  Dr. Loreta Ave, GI  Procedures:    Antibiotics:    HPI/Subjective: Reports she's feeling heavy headed. On stool since admission.  Not as dark as yesterday.  Objective: Filed Vitals:   08/15/12 1600 08/15/12 1635 08/15/12 2120 08/16/12 0553  BP: 137/44 155/74 119/69 143/68  Pulse: 61 62 60 58  Temp:  98.3 F (36.8 C) 98.8 F (37.1 C) 98 F (36.7 C)  TempSrc:  Oral Oral Oral  Resp: 13 16 15 15   Weight:    81.194 kg (179 lb)  SpO2: 96% 94% 91% 93%   No intake or output data in the 24 hours ending 08/16/12 0948 Filed Weights   08/16/12 0553  Weight: 81.194 kg (179 lb)    Exam:   General:  A&O, NAD, Sitting up in chair, Small hemorrhage in right sclera  Cardiovascular: rrr no m/r/g  Respiratory: no w/c/r, cta  Abdomen: soft slight tenderness in the periumbilical area, nd, +BS, no masses  Musculoskeletal: able to move  all 4 extremities, no swelling noted.  Data Reviewed: Basic Metabolic Panel:  Recent Labs Lab 08/13/12 2158 08/15/12 1212 08/16/12 0630  NA 141 140 141  K 3.7 3.6 3.8  CL 102 102 105  CO2 28 28 27   GLUCOSE 100* 105* 103*  BUN 15 12 14   CREATININE 0.90 0.94 0.95  CALCIUM 9.3 9.6 8.9   Liver Function Tests:  Recent Labs Lab 08/13/12 2158 08/15/12 1212 08/16/12 0630  AST 21 23 17   ALT 17 20 15   ALKPHOS 82 84 74  BILITOT 0.4 0.5 0.7  PROT 7.0 7.3 6.3  ALBUMIN 3.5 3.8 3.1*   CBC:  Recent Labs Lab 08/13/12 2158 08/15/12 1212 08/16/12 0630  WBC 7.8 10.3 7.7  NEUTROABS 4.8 7.6  --   HGB 12.5 12.8 12.0  HCT 36.0 36.6 33.6*  MCV 84.5 85.1 83.8  PLT 265 235 230   CBG:  Recent Labs Lab 08/15/12 1717 08/16/12 0753  GLUCAP 91 92      Studies: Dg Chest 2 View  08/15/2012  *RADIOLOGY REPORT*  Clinical Data: Fall. Syncope.  Hypertension.  CHEST - 2 VIEW  Comparison:  09/06/2011  Findings:  The heart size and mediastinal contours are within normal limits.  Both lungs are clear.  The visualized skeletal structures are unremarkable.  IMPRESSION: No active cardiopulmonary disease.   Original Report Authenticated By: Myles Rosenthal, M.D.    Ct Head Wo Contrast  08/15/2012  *RADIOLOGY REPORT*  Clinical Data: 79 year old female with syncope and headache.  CT HEAD WITHOUT CONTRAST  Technique:  Contiguous axial images were obtained from the base of the skull through the vertex without contrast.  Comparison: 09/06/2011 and prior CTs dating back to 11/22/2004.  Findings: Mild generalized cerebral and cerebellar volume loss and chronic small vessel white matter ischemic changes again noted.  No acute intracranial abnormalities are identified, including mass lesion or mass effect, hydrocephalus, extra-axial fluid collection, midline shift, hemorrhage, or acute infarction.  The visualized bony calvarium is unremarkable. A small amount of mucus/fluid within the left sphenoid sinus noted.   IMPRESSION: No evidence of acute intracranial abnormality.  Mild chronic small vessel white matter ischemic changes.  Mild left sphenoid sinus disease/sinusitis.   Original Report Authenticated By: Harmon Pier, M.D.     Scheduled Meds: . sodium chloride   Intravenous STAT  . hydrochlorothiazide  25 mg Oral Daily  . levothyroxine  50 mcg Oral QAC breakfast  . rOPINIRole  1 mg Oral QHS   Continuous Infusions: . sodium chloride 75 mL/hr at 08/15/12 1726    Principal Problem:   Syncope Active Problems:   Hypertension   Hypothyroidism   Memory loss   Restless leg syndrome    Conley Canal [facility] Pager [phone] If 7PM-7AM, please contact night-coverage at [email or web address], password [facility] 08/16/2012, 9:48 AM  LOS: 1 day    Attending -patient seen and examined, agree with assessment and plan. Admitted with a syncopal episode-had colonoscopy with polypectomy on Friday-one large black BM before she passed out. Place on telemetry, EKG unchanged from previous. Monitor H/H. Await Echo. Likely home in am-if no significant issues.  S Alayha Babineaux

## 2012-08-17 LAB — CBC
MCH: 29.1 pg (ref 26.0–34.0)
MCHC: 33.7 g/dL (ref 30.0–36.0)
Platelets: 189 10*3/uL (ref 150–400)
Platelets: 231 10*3/uL (ref 150–400)
RDW: 12.8 % (ref 11.5–15.5)
RDW: 12.9 % (ref 11.5–15.5)
WBC: 9.6 10*3/uL (ref 4.0–10.5)

## 2012-08-17 LAB — URINALYSIS, ROUTINE W REFLEX MICROSCOPIC
Bilirubin Urine: NEGATIVE
Glucose, UA: NEGATIVE mg/dL
Hgb urine dipstick: NEGATIVE
Protein, ur: NEGATIVE mg/dL
Urobilinogen, UA: 0.2 mg/dL (ref 0.0–1.0)

## 2012-08-17 LAB — GLUCOSE, CAPILLARY: Glucose-Capillary: 101 mg/dL — ABNORMAL HIGH (ref 70–99)

## 2012-08-17 LAB — T4, FREE: Free T4: 1.15 ng/dL (ref 0.80–1.80)

## 2012-08-17 NOTE — Progress Notes (Signed)
  Echocardiogram 2D Echocardiogram has been performed.  Offie Pickron 08/17/2012, 6:58 AM

## 2012-08-17 NOTE — Progress Notes (Signed)
Occupational Therapy Treatment Patient Details Name: [name] MRN: [id number] DOB: 10-02-1920 Today's Date: 08/17/2012 Time: 1425-1500 OT Time Calculation (min): 35 min  OT Assessment / Plan / Recommendation Comments on Treatment Session Pt is performing at a mod I level in mobility and ADL.  Has all necessary DME for home.  Instructed in home safety and energy conservation.  Pt to d/c home today.    Follow Up Recommendations  No OT follow up    Barriers to Discharge       Equipment Recommendations  None recommended by OT    Recommendations for Other Services    Frequency Min 2X/week   Plan Discharge plan remains appropriate    Precautions / Restrictions Precautions Precautions: Fall   Pertinent Vitals/Pain No pain    ADL  Grooming: Wash/dry hands;Brushing hair;Modified independent Where Assessed - Grooming: Unsupported standing Upper Body Dressing: Independent Where Assessed - Upper Body Dressing: Unsupported sitting Lower Body Dressing: Modified independent Where Assessed - Lower Body Dressing: Supported sit to stand Toilet Transfer: Modified independent Statistician Method: Sit to Barista: Regular height toilet Toileting - Clothing Manipulation and Hygiene: Independent Where Assessed - Engineer, mining and Hygiene: Sit to stand from 3-in-1 or toilet Equipment Used: Rolling walker Transfers/Ambulation Related to ADLs: mod I with RW ADL Comments: Pt has a walk in shower with grab bars and a seat at home.    OT Diagnosis:    OT Problem List:   OT Treatment Interventions:     OT Goals ADL Goals Pt Will Perform Grooming: Independently;Unsupported;Standing at sink ADL Goal: Grooming - Progress: Progressing toward goals Pt Will Transfer to Toilet: with modified independence;with DME;Regular height toilet;Comfort height toilet;Grab bars;Ambulation ADL Goal: Toilet Transfer - Progress: Met Pt Will Perform Toileting -  Clothing Manipulation: Independently;Standing ADL Goal: Toileting - Clothing Manipulation - Progress: Met Pt Will Perform Toileting - Hygiene: Independently;Sit to stand from 3-in-1/toilet ADL Goal: Toileting - Hygiene - Progress: Met  Visit Information  Last OT Received On: 08/17/12 Assistance Needed: +1    Subjective Data      Prior Functioning       Cognition  Cognition Overall Cognitive Status: Appears within functional limits for tasks assessed/performed Arousal/Alertness: Awake/alert Orientation Level: Appears intact for tasks assessed Behavior During Session: [facility] for tasks performed    Mobility  Bed Mobility Bed Mobility: Not assessed Transfers Transfers: Sit to Stand;Stand to Sit Sit to Stand: 6: Modified independent (Device/Increase time);From bed;From toilet Stand to Sit: 6: Modified independent (Device/Increase time);To chair/3-in-1;To toilet    Exercises      Balance     End of Session OT - End of Session Activity Tolerance: Patient tolerated treatment well Patient left: in chair;with call bell/phone within reach  GO     Evern Bio 08/17/2012, 3:21 PM [phone]

## 2012-08-17 NOTE — Discharge Summary (Signed)
Physician Discharge Summary  JNAI SNELLGROVE ZOX:[id number] DOB: 10-25-1920 DOA: 08/15/2012  PCP: Estill Cotta, MD  Admit date: 08/15/2012 Discharge date: 08/17/2012  Time spent: 40 minutes  Recommendations for Outpatient Follow-up:  Please monitor bradycardia.  Consider cardiology referral for bradycardia (40's) and grade 1 diastolic dysfunction on 2 D echo this admission.  Patient with normocytic anemia (hgb 10.4, MCV 86.3)   Discharge Diagnoses:  Principal Problem:   Syncope Active Problems:   Hypertension   Hypothyroidism   Memory loss   Restless leg syndrome   Discharge Condition: Stable, ambulating in the hallway with a walker.    Diet recommendation: heart healthy, low salt.  Filed Weights   08/16/12 0553 08/17/12 0500  Weight: 81.194 kg (179 lb) 80.468 kg (177 lb 6.4 oz)    History of present illness:  79 year old female with history of hypothyroidism, HTN, dementia who presented to [facility] ED status post syncopal episode at home.  Ms. Kiesling had a colonoscopy on Friday with the removal of multiple large polyps.  The reported that the morning of the syncopal event she had a large black stool.  Afterward, she was getting dressed (reaching for her shoe box) and she woke up on the floor. She does not recall  prodromal symptoms prior to the fall. No reports of chest pain, no shortness of breath, no palpitations. No fevers, no chills. No abdominal pain, no nausea or vomiting. No reports of diarrhea or constipation.  Patient lives by herself at home and family thinks she may need assisted living placement.  In ED, evaluation included CT head which was negative. CXR did not show acute cardiopulmonary process. Her CBC and BMET was unremarkable.  Hospital Course:  Syncope  The patient was dehydrated, bradycardic and relatively hypotensive on admission and this was felt to play a significant part in her symptoms.    Her lasix was held.  She was monitored on telemetry and was  frequently in the upper 40s.  Despite these things  After admission she was not orthostatic and did not have symptoms of dizziness.  CT head was negative.  Her 2D echo showed grade 1 diastolic dysfunction.  EKG no significantly changed from previous tracing.  She was evaluated by both physical therapy and occupational therapy and did well.  No follow up was recommended.  Post Polypectomy Bleed  Patient reports a large black stool 3/16 prior to coming to ED. Colonoscopy 3/15 with multiple large polyps removed. Patient is not on antiplatelet or anticoagulant.  Dr. Loreta Ave of gastroenterology was made aware.  Ms. Brose hgb dropped from 12 to 10 but this was felt to be dilutional.    HTN  HCTZ was initially held but will be restarted at discharge.  We request that she follow up with her primary physician given her bradycardia, grade 1 diastolic dysfunction and anemia.  Hypothyroid  Continue Synthroid.  TSH and Free T4 were checked and well within normal limits.  Restless Leg  Continue Re-quip   Chronic Back Pain  No worrisome signs on exam.  Ambulated with PT   Discharge Exam: Filed Vitals:   08/16/12 2050 08/16/12 2159 08/17/12 0500 08/17/12 0522  BP: 169/72 143/77  151/66  Pulse: 60   55  Temp: 98.3 F (36.8 C)   97.9 F (36.6 C)  TempSrc:    Oral  Resp: 18   18  Height:      Weight:   80.468 kg (177 lb 6.4 oz)   SpO2:  97%   95%    General: A&O, NAD, Sitting up in bed.  Talkative. Cardiovascular: slightly brady, but regular, no M/R/G Respiratory: CTA no w/c/r Abdomen:  Soft, obese, NT, ND, +BS, no masses Skin: no rash, bruise or lesions  Discharge Instructions  Discharge Orders   Future Appointments Provider Department Dept Phone   10/26/2012 10:30 AM Sandford Craze, NP [facility] [phone]   Future Orders Complete By Expires     Diet - low sodium heart healthy  As directed     Increase activity slowly  As directed         Medication List     STOP taking these medications       benzonatate 100 MG capsule  Commonly known as:  TESSALON      TAKE these medications       hydrochlorothiazide 12.5 MG capsule  Commonly known as:  MICROZIDE  Take 25 mg by mouth daily.     levothyroxine 50 MCG tablet  Commonly known as:  SYNTHROID, LEVOTHROID  Take 50 mcg by mouth daily.     LORazepam 1 MG tablet  Commonly known as:  ATIVAN  Take 1 mg by mouth at bedtime as needed (for anxiety/sleep).     Magnesium 500 MG Tabs  Take 1 tablet by mouth at bedtime.     methocarbamol 500 MG tablet  Commonly known as:  ROBAXIN  Take 500 mg by mouth 2 (two) times daily as needed (for muscle spasms).     rOPINIRole 1 MG tablet  Commonly known as:  REQUIP  Take 1 mg by mouth at bedtime.           Follow-up Information   Follow up with Letitia Libra, Ala Dach, MD. Schedule an appointment as soon as possible for a visit in 2 weeks.   Contact information:   [address] [place] [address] [phone]        The results of significant diagnostics from this hospitalization (including imaging, microbiology, ancillary and laboratory) are listed below for reference.    Significant Diagnostic Studies:  2D Echo Study Conclusions  - Left ventricle: The cavity size was normal. Wall thickness was normal. Systolic function was normal. The estimated ejection fraction was in the range of 55% to 60%. Wall motion was normal; there were no regional wall motion abnormalities. Doppler parameters are consistent with abnormal left ventricular relaxation (grade 1 diastolic dysfunction). - Aortic valve: Mild regurgitation. - Mitral valve: Mild regurgitation. - Pulmonary arteries: PA peak pressure: 32mm Hg (S).   Dg Chest 2 View  08/15/2012  *RADIOLOGY REPORT*  Clinical Data: Fall. Syncope.  Hypertension.  CHEST - 2 VIEW  Comparison:  09/06/2011  Findings:  The heart size and mediastinal contours are within normal limits.  Both lungs  are clear.  The visualized skeletal structures are unremarkable.  IMPRESSION: No active cardiopulmonary disease.   Original Report Authenticated By: Myles Rosenthal, M.D.    Ct Head Wo Contrast  08/15/2012  *RADIOLOGY REPORT*  Clinical Data: 79 year old female with syncope and headache.  CT HEAD WITHOUT CONTRAST  Technique:  Contiguous axial images were obtained from the base of the skull through the vertex without contrast.  Comparison: 09/06/2011 and prior CTs dating back to 11/22/2004.  Findings: Mild generalized cerebral and cerebellar volume loss and chronic small vessel white matter ischemic changes again noted.  No acute intracranial abnormalities are identified, including mass lesion or mass effect, hydrocephalus, extra-axial fluid collection, midline  shift, hemorrhage, or acute infarction.  The visualized bony calvarium is unremarkable. A small amount of mucus/fluid within the left sphenoid sinus noted.  IMPRESSION: No evidence of acute intracranial abnormality.  Mild chronic small vessel white matter ischemic changes.  Mild left sphenoid sinus disease/sinusitis.   Original Report Authenticated By: Harmon Pier, M.D.     Labs: Basic Metabolic Panel:  Recent Labs Lab 08/13/12 2158 08/15/12 1212 08/16/12 0630  NA 141 140 141  K 3.7 3.6 3.8  CL 102 102 105  CO2 28 28 27   GLUCOSE 100* 105* 103*  BUN 15 12 14   CREATININE 0.90 0.94 0.95  CALCIUM 9.3 9.6 8.9   Liver Function Tests:  Recent Labs Lab 08/13/12 2158 08/15/12 1212 08/16/12 0630  AST 21 23 17   ALT 17 20 15   ALKPHOS 82 84 74  BILITOT 0.4 0.5 0.7  PROT 7.0 7.3 6.3  ALBUMIN 3.5 3.8 3.1*   CBC:  Recent Labs Lab 08/13/12 2158 08/15/12 1212 08/16/12 0630 08/16/12 1631 08/17/12 0650  WBC 7.8 10.3 7.7 8.6 7.5  NEUTROABS 4.8 7.6  --   --   --   HGB 12.5 12.8 12.0 11.7* 10.4*  HCT 36.0 36.6 33.6* 33.9* 30.9*  MCV 84.5 85.1 83.8 85.8 86.3  PLT 265 235 230 232 189   CBG:  Recent Labs Lab 08/15/12 1717 08/16/12 0753  08/17/12 0746  GLUCAP 91 92 101*    Signed:  Conley Canal [phone] [facility] 08/17/2012, 10:31 AM  Attending Patient seen and examined, agree with the above assessment and plan. Tele/Echo unremarkable-stable for discharge. Dr Loreta Ave will follow polypectomy bx results   S G himire

## 2012-08-17 NOTE — Progress Notes (Signed)
Frederica Kuster Palen to be D/C'd Home per MD order.  Discussed with the patient and all questions fully answered.    Medication List    STOP taking these medications       benzonatate 100 MG capsule  Commonly known as:  TESSALON      TAKE these medications       hydrochlorothiazide 12.5 MG capsule  Commonly known as:  MICROZIDE  Take 25 mg by mouth daily.     levothyroxine 50 MCG tablet  Commonly known as:  SYNTHROID, LEVOTHROID  Take 50 mcg by mouth daily.     LORazepam 1 MG tablet  Commonly known as:  ATIVAN  Take 1 mg by mouth at bedtime as needed (for anxiety/sleep).     Magnesium 500 MG Tabs  Take 1 tablet by mouth at bedtime.     methocarbamol 500 MG tablet  Commonly known as:  ROBAXIN  Take 500 mg by mouth 2 (two) times daily as needed (for muscle spasms).     rOPINIRole 1 MG tablet  Commonly known as:  REQUIP  Take 1 mg by mouth at bedtime.        VVS, Skin clean, dry and intact without evidence of skin break down, no evidence of skin tears noted. IV catheter discontinued intact. Site without signs and symptoms of complications. Dressing and pressure applied.  An After Visit Summary was printed and given to the patient. Follow up appointments , new prescriptions and medication administration times given. Handouts given on syncope and teach back done Patient escorted via WC, and D/C home via private auto.  Cindra Eves, RN 08/17/2012 7:35 PM

## 2012-08-17 NOTE — Progress Notes (Signed)
Physical Therapy Treatment Patient Details Name: [name] MRN: [id number] DOB: 1920-09-12 Today's Date: 08/17/2012 Time: 4098-1191 PT Time Calculation (min): 28 min  PT Assessment / Plan / Recommendation Comments on Treatment Session  Pt admitted with syncope and progressing with mobility. Pt educated for need to use AD at all times due to pt reaching for environmental supports without AD. Pt very pleasant and will be able to return home with use of AD.    Follow Up Recommendations        Does the patient have the potential to tolerate intense rehabilitation     Barriers to Discharge        Equipment Recommendations       Recommendations for Other Services    Frequency     Plan Discharge plan remains appropriate;Frequency remains appropriate    Precautions / Restrictions Precautions Precautions: Fall Restrictions Weight Bearing Restrictions: No   Pertinent Vitals/Pain No pain    Mobility  Bed Mobility Supine to Sit: 5: Supervision;HOB flat;With rails Details for Bed Mobility Assistance: pt required cueing to roll to side and up from side for transfer OOB as initially struggling to figure out how to get OOB Transfers Sit to Stand: 6: Modified independent (Device/Increase time);From bed;From toilet Stand to Sit: 6: Modified independent (Device/Increase time);To chair/3-in-1;To toilet Ambulation/Gait Ambulation/Gait Assistance: 5: Supervision Ambulation Distance (Feet): 700 Feet Assistive device: Rolling walker Ambulation/Gait Assistance Details: cueing for posture and position in RW Gait Pattern: Step-through pattern;Decreased stride length;Trunk flexed Gait velocity: decreased Stairs: No    Exercises General Exercises - Lower Extremity Long Arc Quad: AROM;Both;10 reps;Seated Hip Flexion/Marching: AROM;Both;10 reps;Seated   PT Diagnosis:    PT Problem List:   PT Treatment Interventions:     PT Goals Acute Rehab PT Goals PT Goal: Ambulate - Progress:  Progressing toward goal  Visit Information  Last PT Received On: 08/17/12 Assistance Needed: +1    Subjective Data  Subjective: I wouldnt' want to walk that far all the time   Cognition  Cognition Overall Cognitive Status: Appears within functional limits for tasks assessed/performed Arousal/Alertness: Awake/alert Orientation Level: Appears intact for tasks assessed Behavior During Session: [facility] for tasks performed    Balance     End of Session PT - End of Session Activity Tolerance: Patient tolerated treatment well Patient left: in chair;with call bell/phone within reach Nurse Communication: Mobility status   GP     Delorse Lek 08/17/2012, 9:51 AM Delaney Meigs, PT [phone]

## 2012-08-18 NOTE — Care Management Note (Signed)
    Page 1 of 1   08/18/2012     8:46:14 AM   CARE MANAGEMENT NOTE 08/18/2012  Patient:  [name], [name]   Account Number:  [id number]  Date Initiated:  08/16/2012  Documentation initiated by:  Letha Cape  Subjective/Objective Assessment:   dx syncope  admit- lives alone, pta indep.     Action/Plan:   pt eval no needs   Anticipated DC Date:  08/17/2012   Anticipated DC Plan:  HOME/SELF CARE      DC Planning Services  CM consult      Choice offered to / List presented to:             Status of service:  Completed, signed off Medicare Important Message given?   (If response is "NO", the following Medicare IM given date fields will be blank) Date Medicare IM given:   Date Additional Medicare IM given:    Discharge Disposition:  HOME/SELF CARE  Per UR Regulation:  Reviewed for med. necessity/level of care/duration of stay  If discussed at Long Length of Stay Meetings, dates discussed:    Comments:  08/18/12 8:45 Letha Cape RN, BSN [phone] patient lives alone, pta indep.  Per physical therapy , she has no pt needs.  Patient dc to home.

## 2012-09-03 ENCOUNTER — Ambulatory Visit (INDEPENDENT_AMBULATORY_CARE_PROVIDER_SITE_OTHER): Payer: Medicare Other | Admitting: Family Medicine

## 2012-09-03 ENCOUNTER — Encounter: Payer: Self-pay | Admitting: Family Medicine

## 2012-09-03 VITALS — BP 132/79 | HR 68 | Temp 98.2°F | Ht 65.0 in | Wt 181.1 lb

## 2012-09-03 DIAGNOSIS — I38 Endocarditis, valve unspecified: Secondary | ICD-10-CM

## 2012-09-03 DIAGNOSIS — I10 Essential (primary) hypertension: Secondary | ICD-10-CM

## 2012-09-03 DIAGNOSIS — R55 Syncope and collapse: Secondary | ICD-10-CM

## 2012-09-03 DIAGNOSIS — G2581 Restless legs syndrome: Secondary | ICD-10-CM

## 2012-09-03 MED ORDER — HYDROCHLOROTHIAZIDE 12.5 MG PO CAPS: 12.5000 mg | ORAL_CAPSULE | Freq: Every day | ORAL | Status: DC

## 2012-09-03 MED ORDER — LORAZEPAM 1 MG PO TABS: 1.0000 mg | ORAL_TABLET | Freq: Every evening | ORAL | Status: DC | PRN

## 2012-09-03 MED ORDER — METHOCARBAMOL 500 MG PO TABS: 500.0000 mg | ORAL_TABLET | Freq: Every day | ORAL | Status: DC

## 2012-09-03 NOTE — Patient Instructions (Addendum)
Dehydration, Adult Dehydration is when you lose more fluids from the body than you take in. Vital organs like the kidneys, brain, and heart cannot function without a proper amount of fluids and salt. Any loss of fluids from the body can cause dehydration.  CAUSES   Vomiting.  Diarrhea.  Excessive sweating.  Excessive urine output.  Fever. SYMPTOMS  Mild dehydration  Thirst.  Dry lips.  Slightly dry mouth. Moderate dehydration  Very dry mouth.  Sunken eyes.  Skin does not bounce back quickly when lightly pinched and released.  Dark urine and decreased urine production.  Decreased tear production.  Headache. Severe dehydration  Very dry mouth.  Extreme thirst.  Rapid, weak pulse (more than 100 beats per minute at rest).  Cold hands and feet.  Not able to sweat in spite of heat and temperature.  Rapid breathing.  Blue lips.  Confusion and lethargy.  Difficulty being awakened.  Minimal urine production.  No tears. DIAGNOSIS  Your caregiver will diagnose dehydration based on your symptoms and your exam. Blood and urine tests will help confirm the diagnosis. The diagnostic evaluation should also identify the cause of dehydration. TREATMENT  Treatment of mild or moderate dehydration can often be done at home by increasing the amount of fluids that you drink. It is best to drink small amounts of fluid more often. Drinking too much at one time can make vomiting worse. Refer to the home care instructions below. Severe dehydration needs to be treated at the hospital where you will probably be given intravenous (IV) fluids that contain water and electrolytes. HOME CARE INSTRUCTIONS   Ask your caregiver about specific rehydration instructions.  Drink enough fluids to keep your urine clear or pale yellow.  Drink small amounts frequently if you have nausea and vomiting.  Eat as you normally do.  Avoid:  Foods or drinks high in sugar.  Carbonated  drinks.  Juice.  Extremely hot or cold fluids.  Drinks with caffeine.  Fatty, greasy foods.  Alcohol.  Tobacco.  Overeating.  Gelatin desserts.  Wash your hands well to avoid spreading bacteria and viruses.  Only take over-the-counter or prescription medicines for pain, discomfort, or fever as directed by your caregiver.  Ask your caregiver if you should continue all prescribed and over-the-counter medicines.  Keep all follow-up appointments with your caregiver. SEEK MEDICAL CARE IF:  You have abdominal pain and it increases or stays in one area (localizes).  You have a rash, stiff neck, or severe headache.  You are irritable, sleepy, or difficult to awaken.  You are weak, dizzy, or extremely thirsty. SEEK IMMEDIATE MEDICAL CARE IF:   You are unable to keep fluids down or you get worse despite treatment.  You have frequent episodes of vomiting or diarrhea.  You have blood or green matter (bile) in your vomit.  You have blood in your stool or your stool looks black and tarry.  You have not urinated in 6 to 8 hours, or you have only urinated a small amount of very dark urine.  You have a fever.  You faint. MAKE SURE YOU:   Understand these instructions.  Will watch your condition.  Will get help right away if you are not doing well or get worse. Document Released: 05/19/2005 Document Revised: 08/11/2011 Document Reviewed: 01/06/2011 [employer] Patient Information 2013 [employer].  

## 2012-09-04 ENCOUNTER — Encounter: Payer: Self-pay | Admitting: Family Medicine

## 2012-09-04 DIAGNOSIS — I38 Endocarditis, valve unspecified: Secondary | ICD-10-CM | POA: Insufficient documentation

## 2012-09-04 HISTORY — DX: Endocarditis, valve unspecified: I38

## 2012-09-04 NOTE — Progress Notes (Signed)
Patient ID: [name], female   DOB: 12/29/1920, 79 y.o.   MRN: [id number] EDWIN CHERIAN [id number] 1920-09-17 09/04/2012      Progress Note-Follow Up  Subjective  Chief Complaint  Chief Complaint  Patient presents with  . Follow-up    hospital    HPI  Patient is a 79 year old Caucasian female who is here today for hospital followup. She is accompanied by her daughter. She had a syncopal episode at home on a day when she was not feeling well and was brought to the emergency room and admitted. A workup including an echocardiogram did not find a cause of her syncope at she's had no recurrence. Date knowledge she eats poorly in drinks very few liquids. The liquids she does drink each day alternating caffeine. She denies any chest pain, palpitations, shortness of breath, fevers, chills, congestion, GI or GU concerns at this time. Does complain of weakness in her left lower extremity that is left over from polio as a child. She complained of restless leg symptoms and difficulty sleeping as a result. Has frequent cramps and discomfort in both legs. Has not found Requip helpful  Past Medical History  Diagnosis Date  . Arthritis   . Chicken pox   . Chronic kidney disease   . Colon polyps   . Hypertension   . Dementia   . Angina   . Dysrhythmia   . Heart murmur   . Shortness of breath   . Recurrent upper respiratory infection (URI)   . Pneumonia   . Hypothyroidism   . Anemia   . Headache   . Anxiety   . Depression   . Valvular heart disease 09/04/2012    Past Surgical History  Procedure Laterality Date  . Appendectomy    . Cholecystectomy    . Kidney stones    . Tonsillectomy    . Abdominal hysterectomy    . Hand surgery    . Feet surgery    . Shoulder arthroscopy    . Back surgery    . Tonsillectomy    . Eye surgery      cataract removed and eye lids lifted  . Fracture surgery      bilateral arms  . Tubal ligation    . Colonoscopy w/ polypectomy      Family History   Problem Relation Age of Onset  . Heart disease Mother   . Heart disease Father   . Heart disease Other   . Birth defects Other     History   Social History  . Marital Status: Widowed    Spouse Name: N/A    Number of Children: N/A  . Years of Education: N/A   Occupational History  . Not on file.   Social History Main Topics  . Smoking status: Former Smoker -- 0.20 packs/day for 1 years    Types: Cigarettes    Quit date: 06/02/1941  . Smokeless tobacco: Never Used  . Alcohol Use: No  . Drug Use: No  . Sexually Active: Not Currently    Birth Control/ Protection: Abstinence   Other Topics Concern  . Not on file   Social History Narrative   Still lives at home and ambulatory daily. Doesn't use cane or walker, but has cane at home. Remote smoking history.     Current Outpatient Prescriptions on File Prior to Visit  Medication Sig Dispense Refill  . levothyroxine (SYNTHROID, LEVOTHROID) 50 MCG tablet Take 50 mcg by mouth daily.      Marland Kitchen  Magnesium 500 MG TABS Take 1 tablet by mouth at bedtime.      Marland Kitchen rOPINIRole (REQUIP) 1 MG tablet Take 1 mg by mouth at bedtime.       No current facility-administered medications on file prior to visit.    Allergies  Allergen Reactions  . Codeine Rash  . Hydrocodone Rash    Review of Systems  Review of Systems  Constitutional: Positive for malaise/fatigue. Negative for fever.  HENT: Negative for congestion.   Eyes: Negative for discharge.  Respiratory: Negative for shortness of breath.   Cardiovascular: Negative for chest pain, palpitations and leg swelling.  Gastrointestinal: Negative for nausea, abdominal pain and diarrhea.  Genitourinary: Negative for dysuria.  Musculoskeletal: Negative for falls.  Skin: Negative for rash.  Neurological: Positive for loss of consciousness. Negative for dizziness, tingling, tremors, sensory change, speech change, focal weakness, seizures and headaches.  Endo/Heme/Allergies: Negative for  polydipsia.  Psychiatric/Behavioral: Positive for memory loss. Negative for depression and suicidal ideas. The patient is not nervous/anxious and does not have insomnia.     Objective  BP 132/79  Pulse 68  Temp(Src) 98.2 F (36.8 C) (Temporal)  Ht 5\' 5"  (1.651 m)  Wt 181 lb 1.9 oz (82.155 kg)  BMI 30.14 kg/m2  SpO2 96%  Physical Exam  Physical Exam  Constitutional: She is oriented to person, place, and time and well-developed, well-nourished, and in no distress. No distress.  HENT:  Head: Normocephalic and atraumatic.  Eyes: Conjunctivae are normal.  Neck: Neck supple. No thyromegaly present.  Cardiovascular: Normal rate and regular rhythm.   Murmur heard. Pulmonary/Chest: Effort normal and breath sounds normal. She has no wheezes.  Abdominal: She exhibits no distension and no mass.  Musculoskeletal: She exhibits tenderness. She exhibits no edema.  Tender nodules noted over posterior calves  Lymphadenopathy:    She has no cervical adenopathy.  Neurological: She is alert and oriented to person, place, and time.  Skin: Skin is warm and dry. No rash noted. She is not diaphoretic.  Psychiatric: Memory, affect and judgment normal.    Lab Results  Component Value Date   TSH 3.104 08/16/2012   Lab Results  Component Value Date   WBC 9.6 08/17/2012   HGB 12.3 08/17/2012   HCT 35.7* 08/17/2012   MCV 86.2 08/17/2012   PLT 231 08/17/2012   Lab Results  Component Value Date   CREATININE 0.95 08/16/2012   BUN 14 08/16/2012   NA 141 08/16/2012   K 3.8 08/16/2012   CL 105 08/16/2012   CO2 27 08/16/2012   Lab Results  Component Value Date   ALT 15 08/16/2012   AST 17 08/16/2012   ALKPHOS 74 08/16/2012   BILITOT 0.7 08/16/2012   Lab Results  Component Value Date   CHOL 226* 02/25/2011   Lab Results  Component Value Date   HDL 41 02/25/2011   Lab Results  Component Value Date   LDLCALC 151* 02/25/2011   Lab Results  Component Value Date   TRIG 168* 02/25/2011   Lab Results   Component Value Date   CHOLHDL 5.5 02/25/2011     Assessment & Plan  Hypertension Patient struggling with recurrent dehydration. Will lower HCTZ to 12.5 mg daily and reassess at next visit.  Syncope No recurrence since coming home from the hospital. Workup in the hospital was unable to find a cause. After long discussion with her daughter and herself it is clear she struggles with dehydration. Eats irregularly and drinks very few fluids. When  she drinks mostly caffeinated beverages. Encouraged increased by mouth intake to minimize caffeine. Patient will report any recurrent episodes  Valvular heart disease asymptomatic  Restless leg syndrome Will try a mild muscle relaxer and continue to monitor, encouraged increased hydration

## 2012-09-04 NOTE — Assessment & Plan Note (Signed)
Will try a mild muscle relaxer and continue to monitor, encouraged increased hydration

## 2012-09-04 NOTE — Assessment & Plan Note (Signed)
No recurrence since coming home from the hospital. Workup in the hospital was unable to find a cause. After long discussion with her daughter and herself it is clear she struggles with dehydration. Eats irregularly and drinks very few fluids. When she drinks mostly caffeinated beverages. Encouraged increased by mouth intake to minimize caffeine. Patient will report any recurrent episodes

## 2012-09-04 NOTE — Assessment & Plan Note (Signed)
Patient struggling with recurrent dehydration. Will lower HCTZ to 12.5 mg daily and reassess at next visit.

## 2012-09-04 NOTE — Assessment & Plan Note (Signed)
asymptomatic

## 2012-09-06 ENCOUNTER — Encounter: Payer: Self-pay | Admitting: Family

## 2012-09-06 ENCOUNTER — Telehealth: Payer: Self-pay | Admitting: Internal Medicine

## 2012-09-06 NOTE — Telephone Encounter (Signed)
Kim with [employer] [facility] [place] approved methocarbamol 500 mg tablet on 09-06-2012 for 1 year

## 2012-10-01 ENCOUNTER — Encounter: Payer: Self-pay | Admitting: Family Medicine

## 2012-10-01 ENCOUNTER — Ambulatory Visit (INDEPENDENT_AMBULATORY_CARE_PROVIDER_SITE_OTHER): Payer: Medicare Other | Admitting: Family Medicine

## 2012-10-01 VITALS — BP 140/66 | HR 69 | Temp 97.8°F | Ht 65.0 in | Wt 181.0 lb

## 2012-10-01 DIAGNOSIS — M79605 Pain in left leg: Secondary | ICD-10-CM

## 2012-10-01 DIAGNOSIS — I10 Essential (primary) hypertension: Secondary | ICD-10-CM

## 2012-10-01 DIAGNOSIS — E039 Hypothyroidism, unspecified: Secondary | ICD-10-CM

## 2012-10-01 DIAGNOSIS — M79609 Pain in unspecified limb: Secondary | ICD-10-CM

## 2012-10-01 NOTE — Patient Instructions (Addendum)
  Wear the compression hose on in am off in pm Call if you want a sleep study Call if your hip hurts more let me know and we will order a hip xray One a Day multivitamin for weight loss   Varicose Veins Varicose veins are veins that have become enlarged and twisted. CAUSES This condition is the result of valves in the veins not working properly. Valves in the veins help return blood from the leg to the heart. If these valves are damaged, blood flows backwards and backs up into the veins in the leg near the skin. This causes the veins to become larger. People who are on their feet a lot, who are pregnant, or who are overweight are more likely to develop varicose veins. SYMPTOMS   Bulging, twisted-appearing, bluish veins, most commonly found on the legs.  Leg pain or a feeling of heaviness. These symptoms may be worse at the end of the day.  Leg swelling.  Skin color changes. DIAGNOSIS  Varicose veins can usually be diagnosed with an exam of your legs by your caregiver. He or she may recommend an ultrasound of your leg veins. TREATMENT  Most varicose veins can be treated at home.However, other treatments are available for people who have persistent symptoms or who want to treat the cosmetic appearance of the varicose veins. These include:  Laser treatment of very small varicose veins.  Medicine that is shot (injected) into the vein. This medicine hardens the walls of the vein and closes off the vein. This treatment is called sclerotherapy. Afterwards, you may need to wear clothing or bandages that apply pressure.  Surgery. HOME CARE INSTRUCTIONS   Do not stand or sit in one position for long periods of time. Do not sit with your legs crossed. Rest with your legs raised during the day.  Wear elastic stockings or support hose. Do not wear other tight, encircling garments around the legs, pelvis, or waist.  Walk as much as possible to increase blood flow.  Raise the foot of your bed  at night with 2-inch blocks.  If you get a cut in the skin over the vein and the vein bleeds, lie down with your leg raised and press on it with a clean cloth until the bleeding stops. Then place a bandage (dressing) on the cut. See your caregiver if it continues to bleed or needs stitches. SEEK MEDICAL CARE IF:   The skin around your ankle starts to break down.  You have pain, redness, tenderness, or hard swelling developing in your leg over a vein.  You are uncomfortable due to leg pain. Document Released: 02/26/2005 Document Revised: 08/11/2011 Document Reviewed: 07/15/2010 [facility] Patient Information 2013 [place], [place].

## 2012-10-02 ENCOUNTER — Encounter: Payer: Self-pay | Admitting: Family Medicine

## 2012-10-02 DIAGNOSIS — M1990 Unspecified osteoarthritis, unspecified site: Secondary | ICD-10-CM

## 2012-10-02 DIAGNOSIS — M79605 Pain in left leg: Secondary | ICD-10-CM

## 2012-10-02 HISTORY — DX: Pain in left leg: M79.605

## 2012-10-02 HISTORY — DX: Unspecified osteoarthritis, unspecified site: M19.90

## 2012-10-02 NOTE — Assessment & Plan Note (Signed)
Adequately controlled, no changes 

## 2012-10-02 NOTE — Assessment & Plan Note (Signed)
Well controlled, no changes to meds today 

## 2012-10-02 NOTE — Assessment & Plan Note (Signed)
Majority of pain in knee, try Aspercreme and tylenol report worsening symptoms

## 2012-10-02 NOTE — Progress Notes (Signed)
Patient ID: [name], female   DOB: May 15, 1921, 79 y.o.   MRN: [id number] TOBA CLAUDIO [id number] 27-Oct-1920 10/02/2012      Progress Note-Follow Up  Subjective  Chief Complaint  Chief Complaint  Patient presents with  . Follow-up    4 week    HPI  47-year-old Caucasian female who is here today in followup. She is reporting some pain in her left knee and leg but finds it manageable no falls. No swelling or warmth. No injury. Her other largest complaint is of fatigue. Denies chest pain or palpitations. No shortness of breath GI or GU complaints. Has trouble getting here to the office from the other side of [place] and is contemplating transferring her care to an office but is undecided today.  Past Medical History  Diagnosis Date  . Arthritis   . Chicken pox   . Chronic kidney disease   . Colon polyps   . Hypertension   . Dementia   . Angina   . Dysrhythmia   . Heart murmur   . Shortness of breath   . Recurrent upper respiratory infection (URI)   . Pneumonia   . Hypothyroidism   . Anemia   . Headache   . Anxiety   . Depression   . Valvular heart disease 09/04/2012  . Left leg pain 10/02/2012    Past Surgical History  Procedure Laterality Date  . Appendectomy    . Cholecystectomy    . Kidney stones    . Tonsillectomy    . Abdominal hysterectomy    . Hand surgery    . Feet surgery    . Shoulder arthroscopy    . Back surgery    . Tonsillectomy    . Eye surgery      cataract removed and eye lids lifted  . Fracture surgery      bilateral arms  . Tubal ligation    . Colonoscopy w/ polypectomy      Family History  Problem Relation Age of Onset  . Heart disease Mother   . Heart disease Father   . Heart disease Other   . Birth defects Other     History   Social History  . Marital Status: Widowed    Spouse Name: N/A    Number of Children: N/A  . Years of Education: N/A   Occupational History  . Not on file.   Social History Main Topics  . Smoking  status: Former Smoker -- 0.20 packs/day for 1 years    Types: Cigarettes    Quit date: 06/02/1941  . Smokeless tobacco: Never Used  . Alcohol Use: No  . Drug Use: No  . Sexually Active: Not Currently    Birth Control/ Protection: Abstinence   Other Topics Concern  . Not on file   Social History Narrative   Still lives at home and ambulatory daily. Doesn't use cane or walker, but has cane at home. Remote smoking history.     Current Outpatient Prescriptions on File Prior to Visit  Medication Sig Dispense Refill  . hydrochlorothiazide (MICROZIDE) 12.5 MG capsule Take 1 capsule (12.5 mg total) by mouth daily.  30 capsule  2  . levothyroxine (SYNTHROID, LEVOTHROID) 50 MCG tablet Take 50 mcg by mouth daily.      Marland Kitchen LORazepam (ATIVAN) 1 MG tablet Take 1 tablet (1 mg total) by mouth at bedtime as needed for anxiety (for anxiety).  30 tablet    . Magnesium 500 MG TABS Take 1  tablet by mouth at bedtime.      . methocarbamol (ROBAXIN) 500 MG tablet Take 1 tablet (500 mg total) by mouth at bedtime.  30 tablet  3  . rOPINIRole (REQUIP) 1 MG tablet Take 1 mg by mouth at bedtime.       No current facility-administered medications on file prior to visit.    Allergies  Allergen Reactions  . Codeine Rash  . Hydrocodone Rash    Review of Systems  Review of Systems  Constitutional: Positive for malaise/fatigue. Negative for fever.  HENT: Negative for congestion.   Eyes: Negative for discharge.  Respiratory: Negative for shortness of breath.   Cardiovascular: Negative for chest pain, palpitations and leg swelling.  Gastrointestinal: Negative for nausea, abdominal pain and diarrhea.  Genitourinary: Negative for dysuria.  Musculoskeletal: Positive for myalgias and joint pain. Negative for falls.  Skin: Negative for rash.  Neurological: Negative for loss of consciousness and headaches.  Endo/Heme/Allergies: Negative for polydipsia.  Psychiatric/Behavioral: Positive for memory loss. Negative  for depression and suicidal ideas. The patient is not nervous/anxious and does not have insomnia.     Objective  BP 140/66  Pulse 69  Temp(Src) 97.8 F (36.6 C) (Oral)  Ht 5\' 5"  (1.651 m)  Wt 181 lb 0.6 oz (82.119 kg)  BMI 30.13 kg/m2  SpO2 96%  Physical Exam  Physical Exam  Constitutional: She is oriented to person, place, and time and well-developed, well-nourished, and in no distress. No distress.  HENT:  Head: Normocephalic and atraumatic.  Eyes: Conjunctivae are normal.  Neck: Neck supple. No thyromegaly present.  Cardiovascular: Normal rate, regular rhythm and normal heart sounds.   No murmur heard. Pulmonary/Chest: Effort normal and breath sounds normal. She has no wheezes.  Abdominal: She exhibits no distension and no mass.  Musculoskeletal: She exhibits no edema.  Lymphadenopathy:    She has no cervical adenopathy.  Neurological: She is alert and oriented to person, place, and time.  Skin: Skin is warm and dry. No rash noted. She is not diaphoretic.  Psychiatric: Memory, affect and judgment normal.    Lab Results  Component Value Date   TSH 3.104 08/16/2012   Lab Results  Component Value Date   WBC 9.6 08/17/2012   HGB 12.3 08/17/2012   HCT 35.7* 08/17/2012   MCV 86.2 08/17/2012   PLT 231 08/17/2012   Lab Results  Component Value Date   CREATININE 0.95 08/16/2012   BUN 14 08/16/2012   NA 141 08/16/2012   K 3.8 08/16/2012   CL 105 08/16/2012   CO2 27 08/16/2012   Lab Results  Component Value Date   ALT 15 08/16/2012   AST 17 08/16/2012   ALKPHOS 74 08/16/2012   BILITOT 0.7 08/16/2012   Lab Results  Component Value Date   CHOL 226* 02/25/2011   Lab Results  Component Value Date   HDL 41 02/25/2011   Lab Results  Component Value Date   LDLCALC 151* 02/25/2011   Lab Results  Component Value Date   TRIG 168* 02/25/2011   Lab Results  Component Value Date   CHOLHDL 5.5 02/25/2011     Assessment & Plan  Hypertension Adequately controlled, no  changes.   Hypothyroidism Well controlled, no changes to meds today  Left leg pain Majority of pain in knee, try Aspercreme and tylenol report worsening symptoms

## 2012-10-26 ENCOUNTER — Ambulatory Visit: Payer: Medicare Other | Admitting: Family

## 2012-11-17 ENCOUNTER — Other Ambulatory Visit (HOSPITAL_COMMUNITY): Payer: Self-pay | Admitting: Orthopedic Surgery

## 2012-11-17 DIAGNOSIS — M25552 Pain in left hip: Secondary | ICD-10-CM

## 2012-11-17 DIAGNOSIS — M25562 Pain in left knee: Secondary | ICD-10-CM

## 2012-11-23 ENCOUNTER — Encounter (HOSPITAL_COMMUNITY)
Admission: RE | Admit: 2012-11-23 | Discharge: 2012-11-23 | Disposition: A | Discharge status: Home / Self Care | Payer: Medicare Other | Source: Ambulatory Visit | Attending: Orthopedic Surgery | Admitting: Orthopedic Surgery

## 2012-11-23 DIAGNOSIS — M25562 Pain in left knee: Secondary | ICD-10-CM

## 2012-11-23 DIAGNOSIS — M25552 Pain in left hip: Secondary | ICD-10-CM

## 2012-11-23 DIAGNOSIS — M25559 Pain in unspecified hip: Secondary | ICD-10-CM | POA: Insufficient documentation

## 2012-11-23 DIAGNOSIS — M25569 Pain in unspecified knee: Secondary | ICD-10-CM | POA: Insufficient documentation

## 2012-11-23 IMAGING — CR DG CHEST 1V PORT
1 series · 1 of 1 positions shown · non-contrast
Comparison: CT chest dated 02/25/2011

CLINICAL DATA: Dizziness, headache

PORTABLE CHEST - 1 VIEW

[view not recorded]
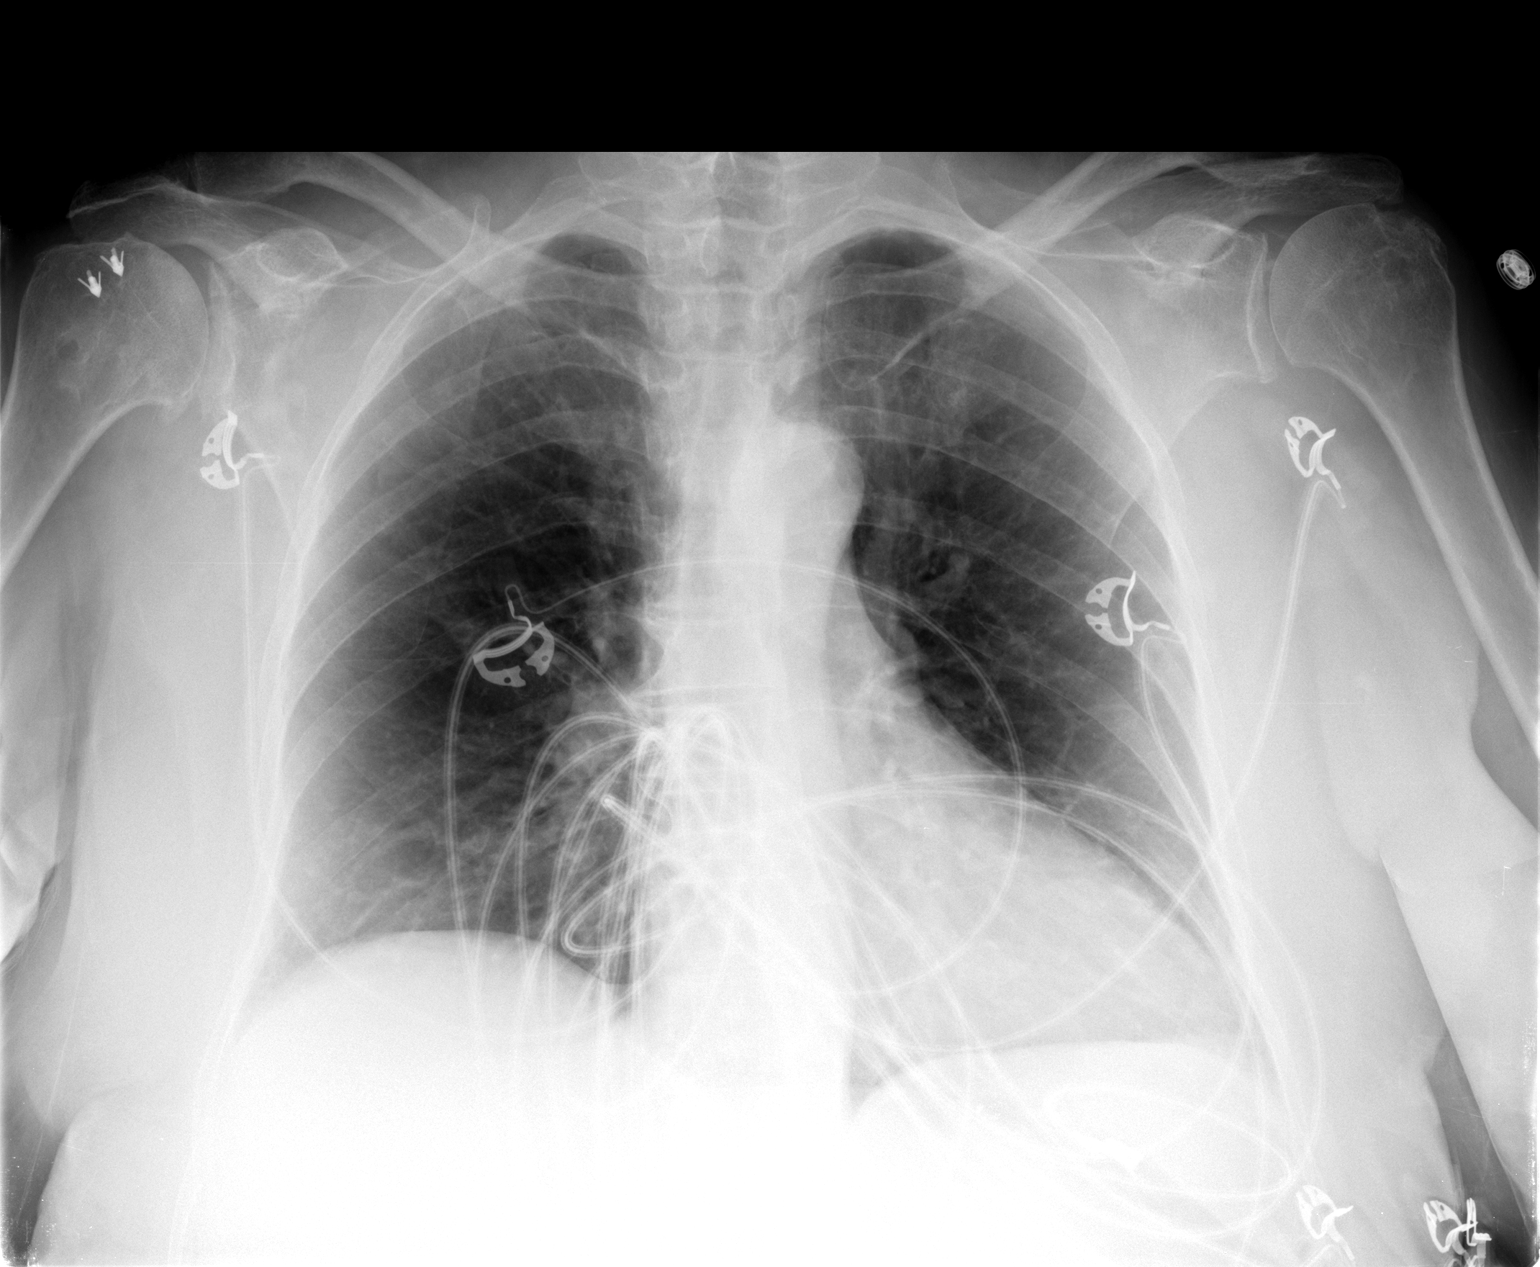

[1 of 1 positions shown; findings below may reference images not displayed]

FINDINGS: Lungs are clear. No pleural effusion or pneumothorax.

Cardiomediastinal silhouette is within normal limits.

Degenerative changes of the bilateral shoulders.
IMPRESSION: No evidence of acute cardiopulmonary disease.

## 2012-11-23 IMAGING — CT CT HEAD W/O CM
1 of 2 series · 16 of 30 positions shown, 20 images · non-contrast
Comparison: 12/15/2006

CLINICAL DATA: Lightheaded, dizziness

CT HEAD WITHOUT CONTRAST
TECHNIQUE: Contiguous axial images were obtained from the base of
the skull through the vertex without contrast.

[Series 2: brain · axial · 0.47mm/px · z∈[-132,+32]mm · 16 of 36 slices shown, 20 images]
[im 2/36  brain]
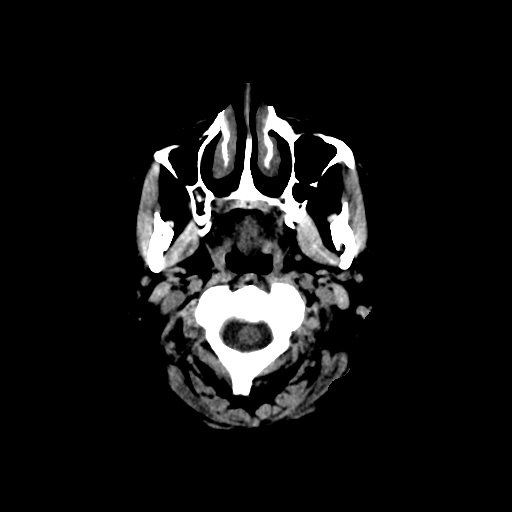
[im 2/36  bone]
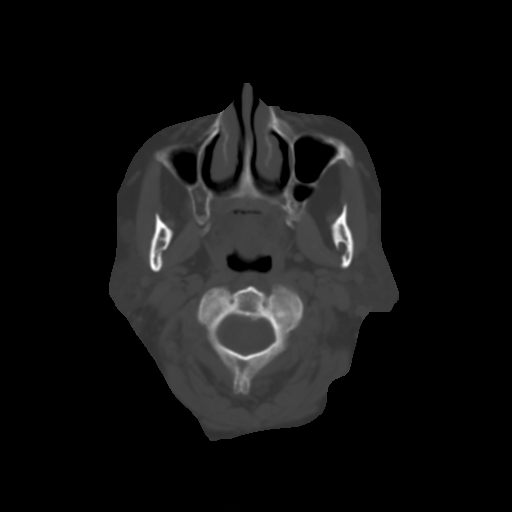
[im 4/36  brain]
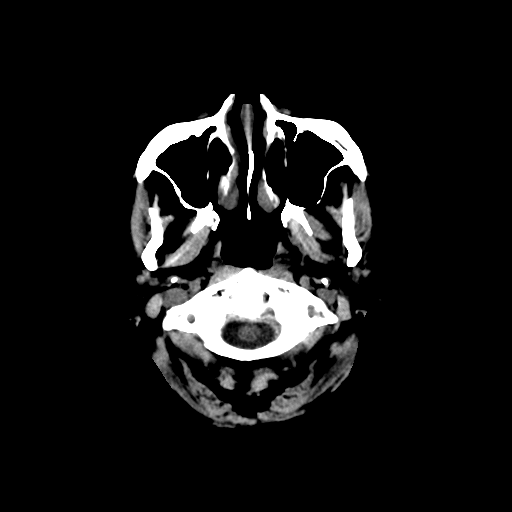
[im 6/36  brain]
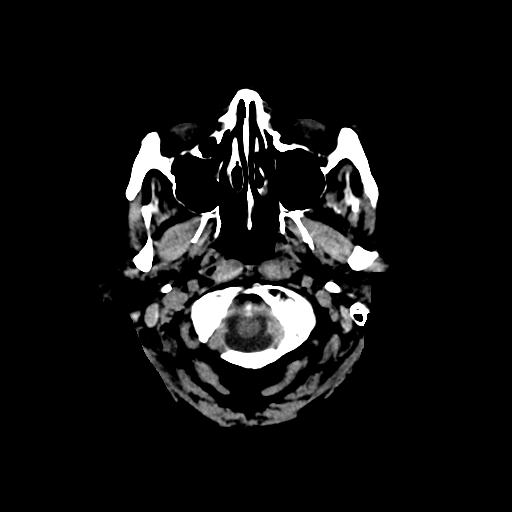
[im 8/36  brain]
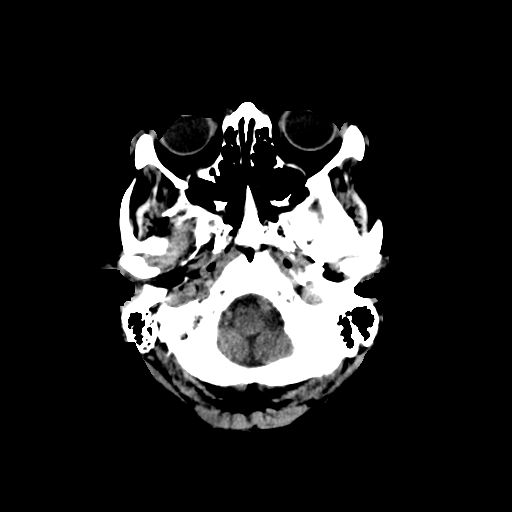
[im 10/36  brain]
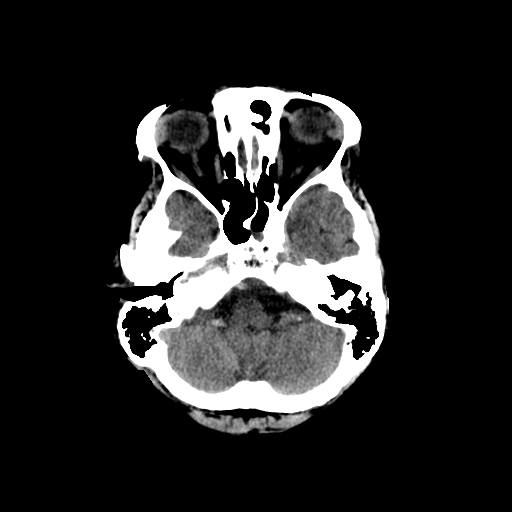
[im 10/36  bone]
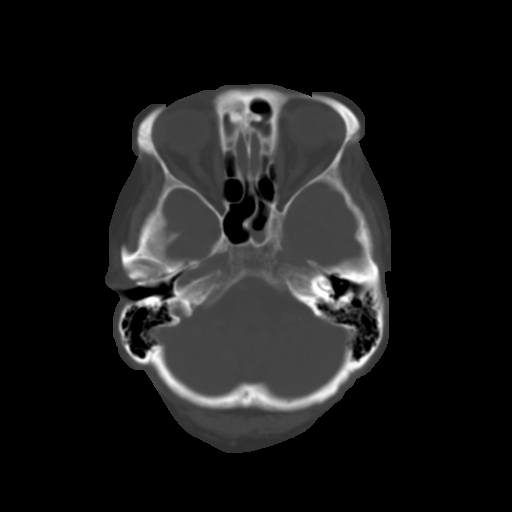
[im 12/36  brain]
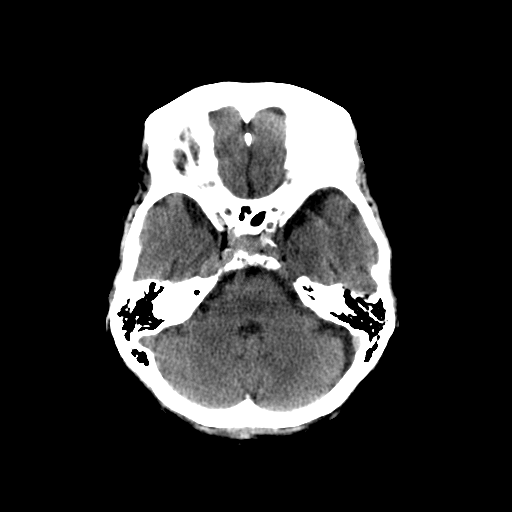
[im 14/36  brain]
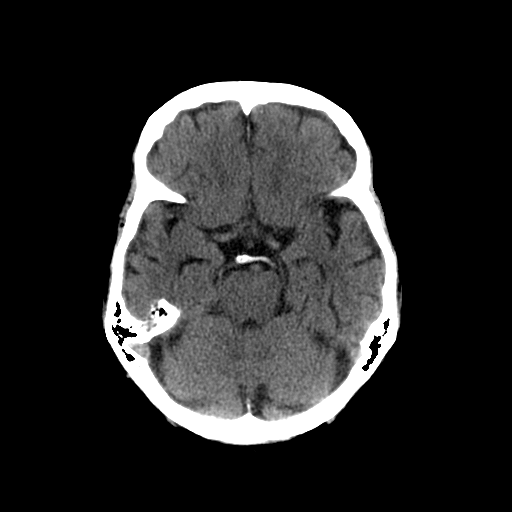
[im 16/36  brain]
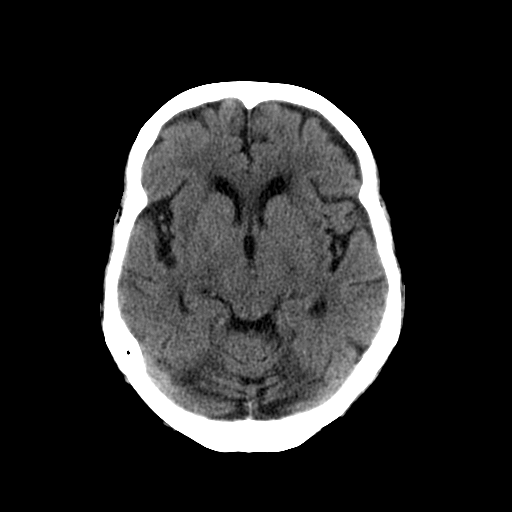
[im 20/36  brain]
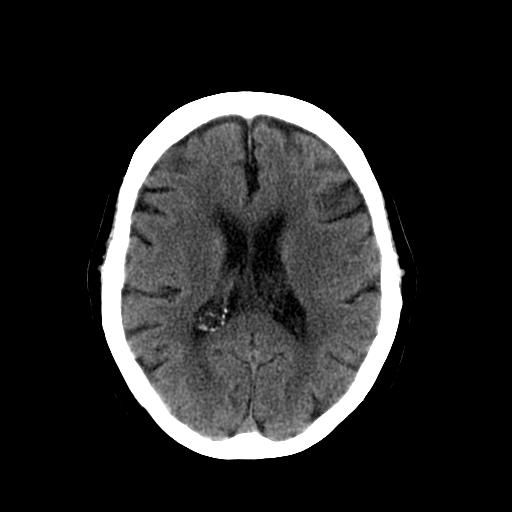
[im 20/36  bone]
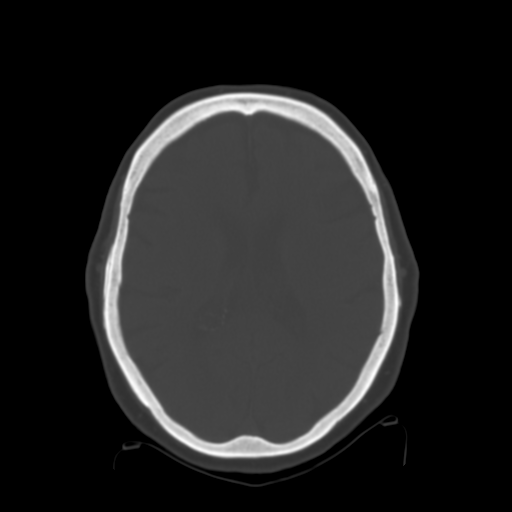
[im 22/36  brain]
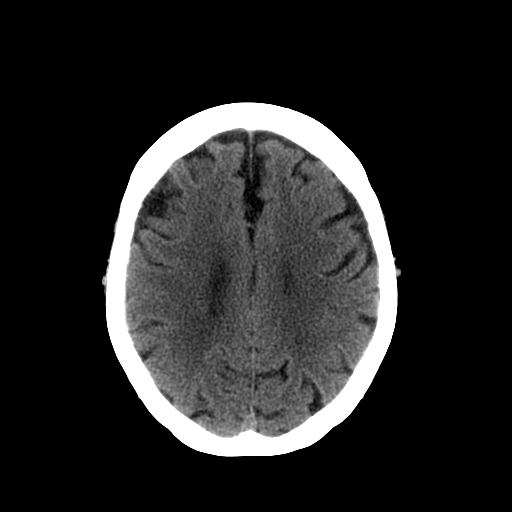
[im 24/36  brain]
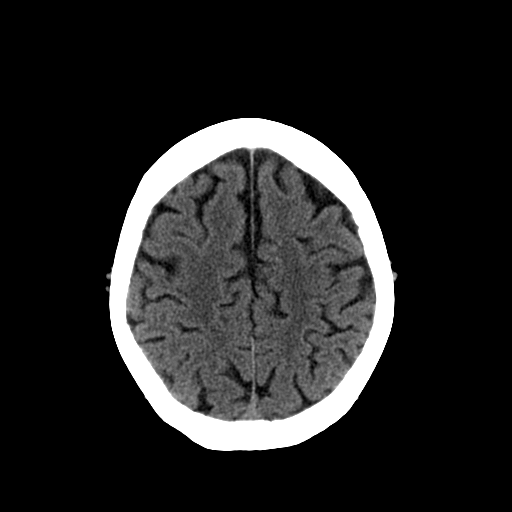
[im 26/36  brain]
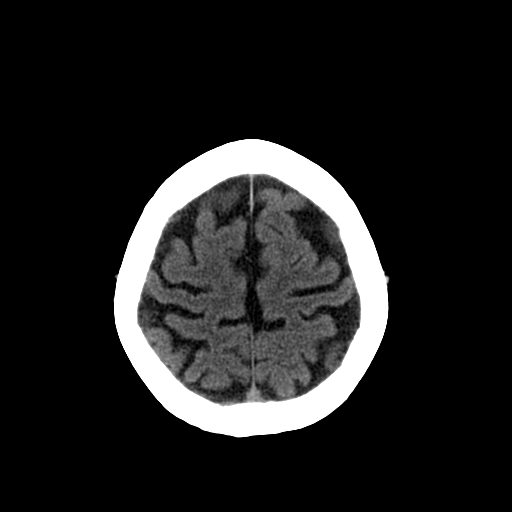
[im 28/36  brain]
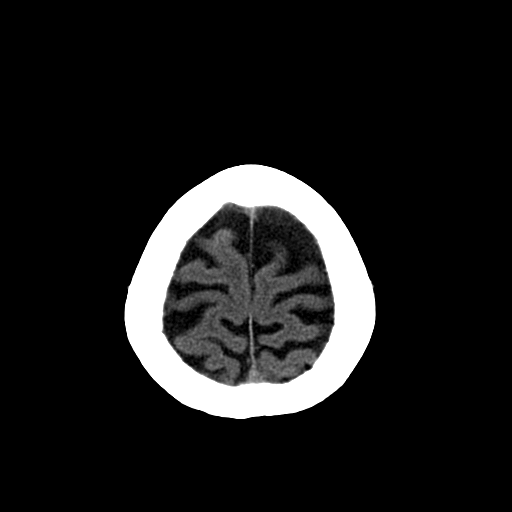
[im 28/36  bone]
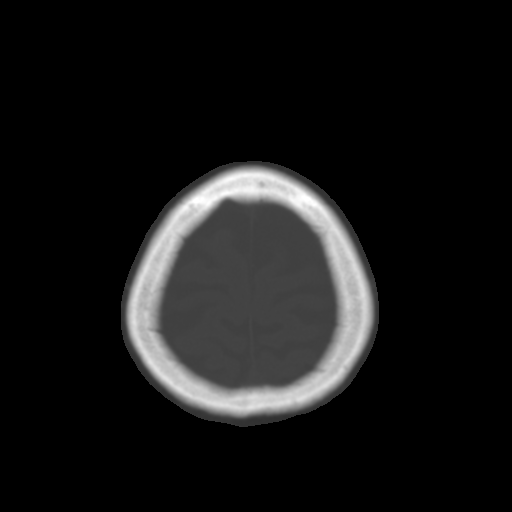
[im 30/36  brain]
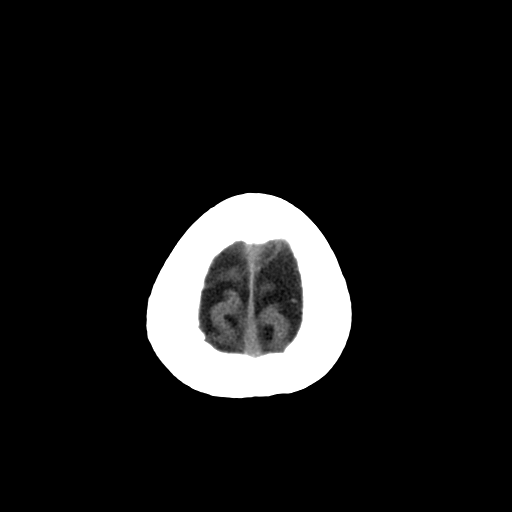
[im 32/36  brain]
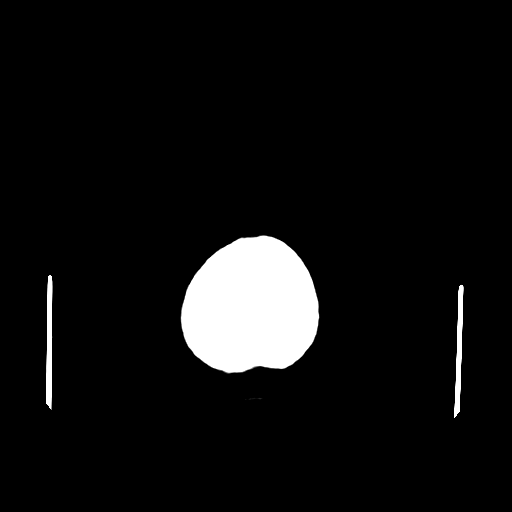
[im 34/36  brain]
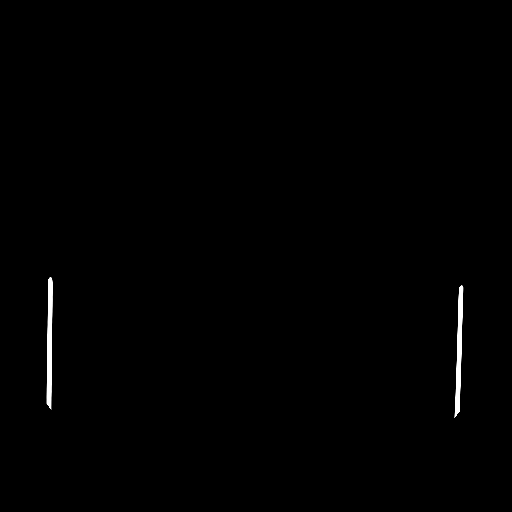

[16 of 30 positions shown; findings below may reference images not displayed]

FINDINGS: No evidence of parenchymal hemorrhage or extra-axial
fluid collection. No mass lesion, mass effect, or midline shift.

No CT evidence of acute infarction.

Subcortical white matter and periventricular small vessel ischemic
changes.  Intracranial atherosclerosis.

Age appropriate cortical atrophy.  No ventriculomegaly.

The visualized paranasal sinuses are essentially clear. The mastoid
air cells are unopacified.

No evidence of calvarial fracture.
IMPRESSION: No evidence of acute intracranial abnormality.

Small vessel ischemic changes with intracranial atherosclerosis.

## 2012-11-23 MED ORDER — TECHNETIUM TC 99M MEDRONATE IV KIT
25.0000 | PACK | Freq: Once | INTRAVENOUS | Status: AC | PRN
  Administered 2012-11-23: 25 via INTRAVENOUS

## 2012-12-07 ENCOUNTER — Ambulatory Visit: Payer: Medicare Other | Admitting: Family Medicine

## 2012-12-13 ENCOUNTER — Other Ambulatory Visit: Payer: Self-pay | Admitting: *Deleted

## 2012-12-13 MED ORDER — LEVOTHYROXINE SODIUM 50 MCG PO TABS: 50.0000 ug | ORAL_TABLET | Freq: Every day | ORAL | Status: DC

## 2012-12-13 MED ORDER — HYDROCHLOROTHIAZIDE 12.5 MG PO CAPS: 12.5000 mg | ORAL_CAPSULE | Freq: Every day | ORAL | Status: DC

## 2012-12-13 NOTE — Telephone Encounter (Signed)
Rx request to pharmacy/[facility]  

## 2012-12-16 ENCOUNTER — Emergency Department (HOSPITAL_COMMUNITY)
Admission: EM | Admit: 2012-12-16 | Discharge: 2012-12-16 | Disposition: A | Discharge status: Home / Self Care | Payer: Medicare Other | Attending: Emergency Medicine | Admitting: Emergency Medicine

## 2012-12-16 ENCOUNTER — Emergency Department (HOSPITAL_COMMUNITY): Payer: Medicare Other

## 2012-12-16 ENCOUNTER — Encounter (HOSPITAL_COMMUNITY): Payer: Self-pay | Admitting: Adult Health

## 2012-12-16 DIAGNOSIS — F329 Major depressive disorder, single episode, unspecified: Secondary | ICD-10-CM | POA: Insufficient documentation

## 2012-12-16 DIAGNOSIS — Z8619 Personal history of other infectious and parasitic diseases: Secondary | ICD-10-CM | POA: Insufficient documentation

## 2012-12-16 DIAGNOSIS — F3289 Other specified depressive episodes: Secondary | ICD-10-CM | POA: Insufficient documentation

## 2012-12-16 DIAGNOSIS — Z860100 Personal history of colon polyps, unspecified: Secondary | ICD-10-CM | POA: Insufficient documentation

## 2012-12-16 DIAGNOSIS — Z8669 Personal history of other diseases of the nervous system and sense organs: Secondary | ICD-10-CM | POA: Insufficient documentation

## 2012-12-16 DIAGNOSIS — N390 Urinary tract infection, site not specified: Secondary | ICD-10-CM | POA: Insufficient documentation

## 2012-12-16 DIAGNOSIS — Z8701 Personal history of pneumonia (recurrent): Secondary | ICD-10-CM | POA: Insufficient documentation

## 2012-12-16 DIAGNOSIS — Z87891 Personal history of nicotine dependence: Secondary | ICD-10-CM | POA: Insufficient documentation

## 2012-12-16 DIAGNOSIS — I129 Hypertensive chronic kidney disease with stage 1 through stage 4 chronic kidney disease, or unspecified chronic kidney disease: Secondary | ICD-10-CM | POA: Insufficient documentation

## 2012-12-16 DIAGNOSIS — Z8659 Personal history of other mental and behavioral disorders: Secondary | ICD-10-CM | POA: Insufficient documentation

## 2012-12-16 DIAGNOSIS — R5383 Other fatigue: Secondary | ICD-10-CM | POA: Insufficient documentation

## 2012-12-16 DIAGNOSIS — Z862 Personal history of diseases of the blood and blood-forming organs and certain disorders involving the immune mechanism: Secondary | ICD-10-CM | POA: Insufficient documentation

## 2012-12-16 DIAGNOSIS — F411 Generalized anxiety disorder: Secondary | ICD-10-CM | POA: Insufficient documentation

## 2012-12-16 DIAGNOSIS — Z79899 Other long term (current) drug therapy: Secondary | ICD-10-CM | POA: Insufficient documentation

## 2012-12-16 DIAGNOSIS — R011 Cardiac murmur, unspecified: Secondary | ICD-10-CM | POA: Insufficient documentation

## 2012-12-16 DIAGNOSIS — Z8739 Personal history of other diseases of the musculoskeletal system and connective tissue: Secondary | ICD-10-CM | POA: Insufficient documentation

## 2012-12-16 DIAGNOSIS — R5381 Other malaise: Secondary | ICD-10-CM | POA: Insufficient documentation

## 2012-12-16 DIAGNOSIS — R42 Dizziness and giddiness: Secondary | ICD-10-CM | POA: Insufficient documentation

## 2012-12-16 DIAGNOSIS — Z8679 Personal history of other diseases of the circulatory system: Secondary | ICD-10-CM | POA: Insufficient documentation

## 2012-12-16 DIAGNOSIS — Z8709 Personal history of other diseases of the respiratory system: Secondary | ICD-10-CM | POA: Insufficient documentation

## 2012-12-16 DIAGNOSIS — N189 Chronic kidney disease, unspecified: Secondary | ICD-10-CM | POA: Insufficient documentation

## 2012-12-16 DIAGNOSIS — E039 Hypothyroidism, unspecified: Secondary | ICD-10-CM | POA: Insufficient documentation

## 2012-12-16 LAB — POCT I-STAT TROPONIN I: Troponin i, poc: 0 ng/mL (ref 0.00–0.08)

## 2012-12-16 LAB — COMPREHENSIVE METABOLIC PANEL
ALT: 17 U/L (ref 0–35)
BUN: 15 mg/dL (ref 6–23)
Calcium: 9.5 mg/dL (ref 8.4–10.5)
Creatinine, Ser: 0.95 mg/dL (ref 0.50–1.10)
GFR calc Af Amer: 59 mL/min — ABNORMAL LOW (ref >90)
Glucose, Bld: 119 mg/dL — ABNORMAL HIGH (ref 70–99)
Sodium: 139 mEq/L (ref 135–145)
Total Protein: 7.3 g/dL (ref 6.0–8.3)

## 2012-12-16 LAB — TROPONIN I: Troponin I: <0.3 ng/mL (ref <0.30)

## 2012-12-16 LAB — PROTIME-INR: Prothrombin Time: 11.9 seconds (ref 11.6–15.2)

## 2012-12-16 LAB — CBC
MCH: 30.6 pg (ref 26.0–34.0)
MCV: 86 fL (ref 78.0–100.0)
Platelets: 241 10*3/uL (ref 150–400)
RBC: 4.22 MIL/uL (ref 3.87–5.11)

## 2012-12-16 LAB — URINALYSIS, ROUTINE W REFLEX MICROSCOPIC
Nitrite: NEGATIVE
Protein, ur: NEGATIVE mg/dL
Specific Gravity, Urine: 1.007 (ref 1.005–1.030)
Urobilinogen, UA: 0.2 mg/dL (ref 0.0–1.0)

## 2012-12-16 LAB — DIFFERENTIAL
Eosinophils Absolute: 0.1 10*3/uL (ref 0.0–0.7)
Eosinophils Relative: 1 % (ref 0–5)
Lymphs Abs: 2.4 10*3/uL (ref 0.7–4.0)
Monocytes Absolute: 0.4 10*3/uL (ref 0.1–1.0)

## 2012-12-16 MED ORDER — CEPHALEXIN 500 MG PO CAPS: 500.0000 mg | ORAL_CAPSULE | Freq: Four times a day (QID) | ORAL | Status: DC

## 2012-12-16 NOTE — ED Provider Notes (Signed)
History    CSN: [id number] Arrival date & time 12/16/12  2019  First MD Initiated Contact with Patient 12/16/12 2058     Chief Complaint  Patient presents with  . Dizziness   (Consider location/radiation/quality/duration/timing/severity/associated sxs/prior Treatment) The history is provided by the patient and a relative.   patient here complaining of weakness and dizziness x3-4 weeks. Symptoms have been intermittent and lasting seconds to minutes. Nothing makes them better but they are worse sometimes with standing. Denies any anginal type chest pain. Denies abdominal pain. Denies any bloody stools. No fever or chills. No cough or shortness of breath. No treatment used prior to arrival. Denies any headache or visual changes. No weakness in arms or legs. Past Medical History  Diagnosis Date  . Arthritis   . Chicken pox   . Chronic kidney disease   . Colon polyps   . Hypertension   . Dementia   . Angina   . Dysrhythmia   . Heart murmur   . Shortness of breath   . Recurrent upper respiratory infection (URI)   . Pneumonia   . Hypothyroidism   . Anemia   . Headache(784.0)   . Anxiety   . Depression   . Valvular heart disease 09/04/2012  . Left leg pain 10/02/2012   Past Surgical History  Procedure Laterality Date  . Appendectomy    . Cholecystectomy    . Kidney stones    . Tonsillectomy    . Abdominal hysterectomy    . Hand surgery    . Feet surgery    . Shoulder arthroscopy    . Back surgery    . Tonsillectomy    . Eye surgery      cataract removed and eye lids lifted  . Fracture surgery      bilateral arms  . Tubal ligation    . Colonoscopy w/ polypectomy     Family History  Problem Relation Age of Onset  . Heart disease Mother   . Heart disease Father   . Heart disease Other   . Birth defects Other    History  Substance Use Topics  . Smoking status: Former Smoker -- 0.20 packs/day for 1 years    Types: Cigarettes    Quit date: 06/02/1941  . Smokeless  tobacco: Never Used  . Alcohol Use: No   OB History   Grav Para Term Preterm Abortions TAB SAB Ect Mult Living                 Review of Systems  All other systems reviewed and are negative.    Allergies  Codeine and Hydrocodone  Home Medications   Current Outpatient Rx  Name  Route  Sig  Dispense  Refill  . hydrochlorothiazide (MICROZIDE) 12.5 MG capsule   Oral   Take 1 capsule (12.5 mg total) by mouth daily.   30 capsule   2   . levothyroxine (SYNTHROID, LEVOTHROID) 50 MCG tablet   Oral   Take 1 tablet (50 mcg total) by mouth daily.   30 tablet   3   . LORazepam (ATIVAN) 1 MG tablet   Oral   Take 1 tablet (1 mg total) by mouth at bedtime as needed for anxiety (for anxiety).   30 tablet      . methocarbamol (ROBAXIN) 500 MG tablet   Oral   Take 500 mg by mouth every 6 (six) hours as needed (for pain).          BP  196/79  Pulse 72  Temp(Src) 97.9 F (36.6 C) (Oral)  Resp 16  SpO2 98% Physical Exam  Nursing note and vitals reviewed. Constitutional: She is oriented to person, place, and time. She appears well-developed and well-nourished.  Non-toxic appearance. No distress.  HENT:  Head: Normocephalic and atraumatic.  Eyes: Conjunctivae, EOM and lids are normal. Pupils are equal, round, and reactive to light.  Neck: Normal range of motion. Neck supple. No tracheal deviation present. No mass present.  Cardiovascular: Normal rate, regular rhythm and normal heart sounds.  Exam reveals no gallop.   No murmur heard. Pulmonary/Chest: Effort normal and breath sounds normal. No stridor. No respiratory distress. She has no decreased breath sounds. She has no wheezes. She has no rhonchi. She has no rales.  Abdominal: Soft. Normal appearance and bowel sounds are normal. She exhibits no distension. There is no tenderness. There is no rebound and no CVA tenderness.  Musculoskeletal: Normal range of motion. She exhibits no edema and no tenderness.  Neurological: She is  alert and oriented to person, place, and time. She has normal strength. No cranial nerve deficit or sensory deficit. GCS eye subscore is 4. GCS verbal subscore is 5. GCS motor subscore is 6.  Skin: Skin is warm and dry. No abrasion and no rash noted.  Psychiatric: She has a normal mood and affect. Her speech is normal and behavior is normal.    ED Course  Procedures (including critical care time) Labs Reviewed  CBC  DIFFERENTIAL  PROTIME-INR  APTT  COMPREHENSIVE METABOLIC PANEL  TROPONIN I  URINALYSIS, ROUTINE W REFLEX MICROSCOPIC   No results found. No diagnosis found.  MDM   Date: 12/16/2012  Rate: 64  Rhythm: normal sinus rhythm  QRS Axis: normal  Intervals: normal  ST/T Wave abnormalities: nonspecific ST changes  Conduction Disutrbances:right bundle branch block  Narrative Interpretation:   Old EKG Reviewed: unchanged   10:48 PM And patient to be treated for her UTI. She has no signs of stroke at this time. Stable for discharge  Toy Baker, MD 12/16/12 2249

## 2012-12-16 NOTE — ED Notes (Addendum)
2110  Pt arrives to the room via wheelchair and is able to ambulate to the bed.  Pt is with family and has been seen by the MD.  Pt ambulates to the restroom with slight limp from left leg and hip pain.    2147  Pt back from CT

## 2012-12-16 NOTE — ED Notes (Addendum)
Presents with onset of dizziness that began this am upon waking, dizziness has progressively worsened throughout the day.  Dizziness is worse with movement. Pt is alert and oriented, answers questions appropriately. No droop or drift.  reportrs weakness all over. Reports slight headache.  PERRLA. She states, "I am just not thinking straight, I don't feel all with it and I don't know why"

## 2012-12-18 ENCOUNTER — Observation Stay (HOSPITAL_COMMUNITY)
Admission: EM | Admit: 2012-12-18 | Discharge: 2012-12-21 | Disposition: A | Discharge status: Home / Self Care | Payer: Medicare Other | Attending: Family Medicine | Admitting: Family Medicine

## 2012-12-18 ENCOUNTER — Encounter (HOSPITAL_COMMUNITY): Payer: Self-pay | Admitting: *Deleted

## 2012-12-18 ENCOUNTER — Emergency Department (HOSPITAL_COMMUNITY): Payer: Medicare Other

## 2012-12-18 DIAGNOSIS — I440 Atrioventricular block, first degree: Secondary | ICD-10-CM | POA: Insufficient documentation

## 2012-12-18 DIAGNOSIS — R42 Dizziness and giddiness: Secondary | ICD-10-CM

## 2012-12-18 DIAGNOSIS — R252 Cramp and spasm: Secondary | ICD-10-CM | POA: Insufficient documentation

## 2012-12-18 DIAGNOSIS — F0391 Unspecified dementia with behavioral disturbance: Secondary | ICD-10-CM

## 2012-12-18 DIAGNOSIS — N189 Chronic kidney disease, unspecified: Secondary | ICD-10-CM | POA: Insufficient documentation

## 2012-12-18 DIAGNOSIS — I38 Endocarditis, valve unspecified: Secondary | ICD-10-CM

## 2012-12-18 DIAGNOSIS — M79605 Pain in left leg: Secondary | ICD-10-CM

## 2012-12-18 DIAGNOSIS — Z79899 Other long term (current) drug therapy: Secondary | ICD-10-CM | POA: Insufficient documentation

## 2012-12-18 DIAGNOSIS — F0390 Unspecified dementia without behavioral disturbance: Secondary | ICD-10-CM | POA: Insufficient documentation

## 2012-12-18 DIAGNOSIS — H81399 Other peripheral vertigo, unspecified ear: Secondary | ICD-10-CM

## 2012-12-18 DIAGNOSIS — I129 Hypertensive chronic kidney disease with stage 1 through stage 4 chronic kidney disease, or unspecified chronic kidney disease: Secondary | ICD-10-CM | POA: Insufficient documentation

## 2012-12-18 DIAGNOSIS — M1990 Unspecified osteoarthritis, unspecified site: Secondary | ICD-10-CM | POA: Diagnosis present

## 2012-12-18 DIAGNOSIS — G2581 Restless legs syndrome: Secondary | ICD-10-CM

## 2012-12-18 DIAGNOSIS — I951 Orthostatic hypotension: Principal | ICD-10-CM | POA: Insufficient documentation

## 2012-12-18 DIAGNOSIS — I10 Essential (primary) hypertension: Secondary | ICD-10-CM | POA: Diagnosis present

## 2012-12-18 DIAGNOSIS — R55 Syncope and collapse: Secondary | ICD-10-CM

## 2012-12-18 DIAGNOSIS — E039 Hypothyroidism, unspecified: Secondary | ICD-10-CM | POA: Insufficient documentation

## 2012-12-18 LAB — CBC
MCH: 29.8 pg (ref 26.0–34.0)
MCHC: 34.6 g/dL (ref 30.0–36.0)
Platelets: 237 10*3/uL (ref 150–400)
RBC: 4.2 MIL/uL (ref 3.87–5.11)
RDW: 12.5 % (ref 11.5–15.5)

## 2012-12-18 LAB — CREATININE, SERUM: Creatinine, Ser: 0.86 mg/dL (ref 0.50–1.10)

## 2012-12-18 LAB — BASIC METABOLIC PANEL
BUN: 17 mg/dL (ref 6–23)
CO2: 31 mEq/L (ref 19–32)
Chloride: 100 mEq/L (ref 96–112)
Creatinine, Ser: 0.9 mg/dL (ref 0.50–1.10)
Potassium: 3.8 mEq/L (ref 3.5–5.1)

## 2012-12-18 MED ORDER — CEPHALEXIN 500 MG PO CAPS
500.0000 mg | ORAL_CAPSULE | Freq: Four times a day (QID) | ORAL | Status: DC
  Administered 2012-12-18 – 2012-12-21 (×10): 500 mg via ORAL
  Filled 2012-12-18 (×13): qty 1

## 2012-12-18 MED ORDER — LORAZEPAM 0.5 MG PO TABS
0.5000 mg | ORAL_TABLET | Freq: Once | ORAL | Status: AC
  Administered 2012-12-18: 0.5 mg via ORAL
  Filled 2012-12-18: qty 1

## 2012-12-18 MED ORDER — LORAZEPAM 1 MG PO TABS: 1.0000 mg | ORAL_TABLET | Freq: Four times a day (QID) | ORAL | Status: DC | PRN

## 2012-12-18 MED ORDER — METHOCARBAMOL 500 MG PO TABS: 500.0000 mg | ORAL_TABLET | Freq: Four times a day (QID) | ORAL | Status: DC | PRN

## 2012-12-18 MED ORDER — MECLIZINE HCL 25 MG PO TABS
25.0000 mg | ORAL_TABLET | Freq: Once | ORAL | Status: AC
  Administered 2012-12-18: 25 mg via ORAL
  Filled 2012-12-18: qty 1

## 2012-12-18 MED ORDER — HEPARIN SODIUM (PORCINE) 5000 UNIT/ML IJ SOLN
5000.0000 [IU] | Freq: Three times a day (TID) | INTRAMUSCULAR | Status: DC
  Administered 2012-12-18 – 2012-12-21 (×8): 5000 [IU] via SUBCUTANEOUS
  Filled 2012-12-18 (×11): qty 1

## 2012-12-18 MED ORDER — ACETAMINOPHEN 325 MG PO TABS: 650.0000 mg | ORAL_TABLET | Freq: Four times a day (QID) | ORAL | Status: DC | PRN

## 2012-12-18 MED ORDER — POLYETHYLENE GLYCOL 3350 17 G PO PACK
17.0000 g | PACK | Freq: Every day | ORAL | Status: DC | PRN
  Filled 2012-12-18: qty 1

## 2012-12-18 MED ORDER — LEVOTHYROXINE SODIUM 50 MCG PO TABS
50.0000 ug | ORAL_TABLET | Freq: Every day | ORAL | Status: DC
  Administered 2012-12-18 – 2012-12-21 (×4): 50 ug via ORAL
  Filled 2012-12-18 (×6): qty 1

## 2012-12-18 MED ORDER — ACETAMINOPHEN 650 MG RE SUPP: 650.0000 mg | Freq: Four times a day (QID) | RECTAL | Status: DC | PRN

## 2012-12-18 MED ORDER — LORAZEPAM 1 MG PO TABS: 1.0000 mg | ORAL_TABLET | Freq: Every evening | ORAL | Status: DC | PRN

## 2012-12-18 MED ORDER — HYDROCHLOROTHIAZIDE 12.5 MG PO CAPS
12.5000 mg | ORAL_CAPSULE | Freq: Every day | ORAL | Status: DC
  Administered 2012-12-18: 12.5 mg via ORAL
  Filled 2012-12-18 (×2): qty 1

## 2012-12-18 MED ORDER — MECLIZINE HCL 12.5 MG PO TABS
12.5000 mg | ORAL_TABLET | Freq: Three times a day (TID) | ORAL | Status: DC
  Administered 2012-12-18 – 2012-12-21 (×8): 12.5 mg via ORAL
  Filled 2012-12-18 (×10): qty 1

## 2012-12-18 NOTE — ED Provider Notes (Signed)
History    CSN: [id number] Arrival date & time 12/18/12  1516  First MD Initiated Contact with Patient 12/18/12 1539     Chief Complaint  Patient presents with  . Dizziness  . Weakness   5 caveat dementia history is temperature patient patient's family who accompany her (Consider location/radiation/quality/duration/timing/severity/associated sxs/prior Treatment) HPI  patient complains of dizziness i.e. sensation of room spinning onset 2 days ago. Symptoms worse when changing positions improved when she lies perfectly still. No other symptoms no nausea no vomiting no visual changes. Patient seen here 2 days ago for this same complaint prescribed Keflex as he was determined to have urinary tract infection. She started taking Keflex yesterday. Has had 3 doses thus far. Patient denies lightheadedness.Past Medical History  Diagnosis Date  . Arthritis   . Chicken pox   . Chronic kidney disease   . Colon polyps   . Hypertension   . Dementia   . Angina   . Dysrhythmia   . Heart murmur   . Shortness of breath   . Recurrent upper respiratory infection (URI)   . Pneumonia   . Hypothyroidism   . Anemia   . Headache(784.0)   . Anxiety   . Depression   . Valvular heart disease 09/04/2012  . Left leg pain 10/02/2012   Past Surgical History  Procedure Laterality Date  . Appendectomy    . Cholecystectomy    . Kidney stones    . Tonsillectomy    . Abdominal hysterectomy    . Hand surgery    . Feet surgery    . Shoulder arthroscopy    . Back surgery    . Tonsillectomy    . Eye surgery      cataract removed and eye lids lifted  . Fracture surgery      bilateral arms  . Tubal ligation    . Colonoscopy w/ polypectomy     Family History  Problem Relation Age of Onset  . Heart disease Mother   . Heart disease Father   . Heart disease Other   . Birth defects Other    History  Substance Use Topics  . Smoking status: Former Smoker -- 0.20 packs/day for 1 years    Types: Cigarettes     Quit date: 06/02/1941  . Smokeless tobacco: Never Used  . Alcohol Use: No   OB History   Grav Para Term Preterm Abortions TAB SAB Ect Mult Living                 Review of Systems  Unable to perform ROS: Dementia  Musculoskeletal: Positive for myalgias.       Leg pain for several years, unchanged  Neurological: Positive for dizziness.    Allergies  Codeine and Hydrocodone  Home Medications   Current Outpatient Rx  Name  Route  Sig  Dispense  Refill  . cephALEXin (KEFLEX) 500 MG capsule   Oral   Take 1 capsule (500 mg total) by mouth 4 (four) times daily.   28 capsule   0   . hydrochlorothiazide (MICROZIDE) 12.5 MG capsule   Oral   Take 1 capsule (12.5 mg total) by mouth daily.   30 capsule   2   . levothyroxine (SYNTHROID, LEVOTHROID) 50 MCG tablet   Oral   Take 1 tablet (50 mcg total) by mouth daily.   30 tablet   3   . LORazepam (ATIVAN) 1 MG tablet   Oral   Take 1 tablet (1  mg total) by mouth at bedtime as needed for anxiety (for anxiety).   30 tablet      . methocarbamol (ROBAXIN) 500 MG tablet   Oral   Take 500 mg by mouth every 6 (six) hours as needed (for pain).          BP 166/65  Pulse 62  Temp(Src) 99.1 F (37.3 C) (Oral)  Resp 18  SpO2 98% Physical Exam  Nursing note and vitals reviewed. Constitutional: She appears well-developed and well-nourished.  HENT:  Head: Normocephalic and atraumatic.  Eyes: Conjunctivae are normal. Pupils are equal, round, and reactive to light.  No nystagmus  Neck: Neck supple. No tracheal deviation present. No thyromegaly present.  Cardiovascular: Normal rate and regular rhythm.   No murmur heard. Pulmonary/Chest: Effort normal and breath sounds normal.  Abdominal: Soft. Bowel sounds are normal. She exhibits no distension. There is no tenderness.  Musculoskeletal: Normal range of motion. She exhibits no edema and no tenderness.  Neurological: She is alert. She has normal reflexes. No cranial nerve  deficit. Coordination normal.  Patient becomes vertiginous when she rotates her neck. Vertigo extinguishes when she keeps her eyes fixed. No nystagmus. Gait is unsteady, walks with assistance  Skin: Skin is warm and dry. No rash noted.  Psychiatric: She has a normal mood and affect.   7:30 PM patient remains unsteady on her feet and unable to walk without assistance after treatment with meclizine and Ativan. ED Course  Procedures (including critical care time) Labs Reviewed - No data to display Ct Head (brain) Wo Contrast  12/16/2012   *RADIOLOGY REPORT*  Clinical Data: Dizziness.  CT HEAD WITHOUT CONTRAST  Technique:  Contiguous axial images were obtained from the base of the skull through the vertex without contrast.  Comparison: 08/15/2012  Findings: No evidence for acute hemorrhage, mass lesion, midline shift, hydrocephalus or large infarct.  There is mild atrophy.  Low density in the periventricular and subcortical white matter suggests chronic changes.  No acute bony abnormality.  The visualized sinuses are clear.  IMPRESSION: No acute intracranial abnormality.  Atrophy and evidence for chronic small vessel ischemic changes.   Original Report Authenticated By: Richarda Overlie, M.D.   No diagnosis found.  Date: 12/18/2012  Rate: 55  Rhythm: sinus bradycardia  QRS Axis: left  Intervals: PR prolonged  ST/T Wave abnormalities: nonspecific T wave changes  Conduction Disutrbances:right bundle branch block and left anterior fascicular block  Narrative Interpretation:   Old EKG Reviewed: No significant change from 12/16/2012 interpreted by me Results for orders placed during the hospital encounter of 12/18/12  BASIC METABOLIC PANEL      Result Value Range   Sodium 140  135 - 145 mEq/L   Potassium 3.8  3.5 - 5.1 mEq/L   Chloride 100  96 - 112 mEq/L   CO2 31  19 - 32 mEq/L   Glucose, Bld 106 (*) 70 - 99 mg/dL   BUN 17  6 - 23 mg/dL   Creatinine, Ser 8.65  0.50 - 1.10 mg/dL   Calcium 9.7  8.4 -  78.4 mg/dL   GFR calc non Af Amer 55 (*) >90 mL/min   GFR calc Af Amer 63 (*) >90 mL/min   Ct Head (brain) Wo Contrast  12/16/2012   *RADIOLOGY REPORT*  Clinical Data: Dizziness.  CT HEAD WITHOUT CONTRAST  Technique:  Contiguous axial images were obtained from the base of the skull through the vertex without contrast.  Comparison: 08/15/2012  Findings: No evidence for  acute hemorrhage, mass lesion, midline shift, hydrocephalus or large infarct.  There is mild atrophy.  Low density in the periventricular and subcortical white matter suggests chronic changes.  No acute bony abnormality.  The visualized sinuses are clear.  IMPRESSION: No acute intracranial abnormality.  Atrophy and evidence for chronic small vessel ischemic changes.   Original Report Authenticated By: Richarda Overlie, M.D.   Mr Brain Wo Contrast  12/18/2012   *RADIOLOGY REPORT*  Clinical Data: 79 year old female with vertigo and nausea.  MRI HEAD WITHOUT CONTRAST  Technique:  Multiplanar, multiecho pulse sequences of the brain and surrounding structures were obtained according to standard protocol without intravenous contrast.  Comparison: Head CTs without contrast 12/16/2012 and earlier.  Findings: Cerebral volume is within normal limits for age.  No restricted diffusion to suggest acute infarction.  No midline shift, mass effect, evidence of mass lesion, ventriculomegaly, extra-axial collection or acute intracranial hemorrhage. Cervicomedullary junction and pituitary are within normal limits. Negative visualized cervical spine. Major intracranial vascular flow voids are preserved.  Gray-white matter signal throughout the brain is within normal limits for age.  Incidental coronoid plexus cysts.  No cortical encephalomalacia.  Grossly normal visualized internal auditory structures.  Mastoids are clear.  Postoperative changes to the globes.  Minor paranasal sinus mucosal thickening.  Negative scalp soft tissues.  Normal bone marrow signal.   IMPRESSION: No acute intracranial abnormality.  Negative for age noncontrast MRI appearance of the brain.   Original Report Authenticated By: Erskine Speed, M.D.   Nm Bone Scan 3 Phase Lower Extremity  11/23/2012   *RADIOLOGY REPORT*  Clinical Data: Left knee pain and left hip pain  NUCLEAR MEDICINE THREE PHASE BONE SCAN  Technique:  Radionuclide angiographic images, immediate static blood pool images, and 3-hour delayed static images were obtained after intravenous injection of radiopharmaceutical.  Radiopharmaceutical: CURIE TC-MDP TECHNETIUM TC 44M MEDRONATE IV KIT  Comparison: None.  Findings:  Vascular phase: No  asymmetric or increased vascular flow in the left or right knee.  Blood pool phase:  No asymmetric increased blood pool activity within the left or right knee.  Delayed phase:  No asymmetric increased delayed phase active within the left or right hip or left or right knee. Mild degenerative uptake within the left and right knee.  IMPRESSION: No evidence of fracture within the left hip or left knee.  Mild degenerative uptake within the knees.   Original Report Authenticated By: Genevive Bi, M.D.    MDM  MRI scan of brain ordered to rule out posterior circulation stroke in this demented elderly patient with unreliable history and possibly unreliable physical exam. Patient is unsteady on her feet and at risk for fall. Family does not feel comfortable taking her home. She lives alone. Vertigo is felt to be peripheral in etiology Spoke with Dr.Riswan who will arrange for inpatient stay Diagnosis vertigo  Doug Sou, MD 12/18/12 2000

## 2012-12-18 NOTE — H&P (Addendum)
[facility] History and Physical  [name] ZOX:[id number] DOB: [birth year] DOA: 12/18/2012  Referring physician: Remi Haggard PCP: Danise Edge, MD  Specialists: None  Chief Complaint: Dizziness  HPI: [name] is a 79 y.o. female with a history of hypertension. She presents with dizziness for the past 2-3 days. She came to the ER on the 17th and at that time was diagnosed with a UTI which was suspected to be the cause of her dizziness. She was discharged with Keflex. She states that she failed them and started taking them yesterday she took 3 doses yesterday but has not taken any today. Despite taking the Keflex her dizziness persisted and maybe even got a little worse. In the ER today she was evaluated for vertigo with an MRI which was within normal limits. Despite being given meclizine and Ativan she still feels like she is spinning when she tries to get up. As she lives alone she is unsafe to go home and therefore will need to be admitted until symptoms improve. She is also a bit forgetful and much of the history is obtained from her son and daughter-in-law.  Review of Systems:  ROS is positive for pain in the left leg which is a periodic cramping pain-she was prescribed Robaxin for this.  She has no urinary complaints. The patient denies anorexia, fever, weight loss,, vision loss, decreased hearing, hoarseness, chest pain, syncope, dyspnea on exertion, peripheral edema, balance deficits, hemoptysis, abdominal pain, melena, hematochezia, severe indigestion/heartburn, hematuria, incontinence, genital sores, muscle weakness, suspicious skin lesions, transient blindness, difficulty walking, depression, unusual weight change, abnormal bleeding, enlarged lymph nodes, angioedema, and breast masses.    Past Medical History  Diagnosis Date  . Arthritis   . Chicken pox   . Chronic kidney disease   . Colon polyps   . Hypertension   . Dementia   . Angina   . Dysrhythmia   .  Heart murmur   . Shortness of breath   . Recurrent upper respiratory infection (URI)   . Pneumonia   . Hypothyroidism   . Anemia   . Headache(784.0)   . Anxiety   . Depression   . Valvular heart disease 09/04/2012  . Left leg pain 10/02/2012   Past Surgical History  Procedure Laterality Date  . Appendectomy    . Cholecystectomy    . Kidney stones    . Tonsillectomy    . Abdominal hysterectomy    . Hand surgery    . Feet surgery    . Shoulder arthroscopy    . Back surgery    . Tonsillectomy    . Eye surgery      cataract removed and eye lids lifted  . Fracture surgery      bilateral arms  . Tubal ligation    . Colonoscopy w/ polypectomy     Social History:  reports that she quit smoking about 71 years ago. Her smoking use included Cigarettes. She has a .2 pack-year smoking history. She has never used smokeless tobacco. She reports that she does not drink alcohol or use illicit drugs. Alone in independent living, does most ADLs on her own  Allergies  Allergen Reactions  . Codeine Rash  . Hydrocodone Rash    Family History  Problem Relation Age of Onset  . Heart disease Mother   . Heart disease Father   . Heart disease Other   . Birth defects Other     Prior to Admission medications   Medication Sig  Start Date End Date Taking? Authorizing Provider  cephALEXin (KEFLEX) 500 MG capsule Take 1 capsule (500 mg total) by mouth 4 (four) times daily. 12/16/12  Yes Toy Baker, MD  hydrochlorothiazide (MICROZIDE) 12.5 MG capsule Take 1 capsule (12.5 mg total) by mouth daily. 12/13/12  Yes Bradd Canary, MD  levothyroxine (SYNTHROID, LEVOTHROID) 50 MCG tablet Take 1 tablet (50 mcg total) by mouth daily. 12/13/12  Yes Bradd Canary, MD  LORazepam (ATIVAN) 1 MG tablet Take 1 tablet (1 mg total) by mouth at bedtime as needed for anxiety (for anxiety). 09/03/12  Yes Bradd Canary, MD  methocarbamol (ROBAXIN) 500 MG tablet Take 500 mg by mouth every 6 (six) hours as needed (for  pain).   Yes Historical Provider, MD   Physical Exam: Filed Vitals:   12/18/12 1521 12/18/12 1749  BP: 166/65 154/57  Pulse: 62 63  Temp: 99.1 F (37.3 C)   TempSrc: Oral   Resp: 18 16  SpO2: 98% 98%     General:  Elderly lady laying in bed, appears comfortable, Awake alert oriented x3  Eyes: PERRLA, conjunctiva pink, no scleral icterus  Neck: Supple, no thyromegaly, no JVD, trachea midline  Cardiovascular: Regular rate and rhythm no murmur  Respiratory: Clear to auscultation bilaterally, normal respiratory effort  Abdomen: Off, nontender, nondistended, bowel sounds positive  Skin: Warm and dry  Musculoskeletal: Tenderness in the left calf  Psychiatric: Mood and affect normal, somewhat forgetful  Neurologic: Cranial nerves II through XII are intact, strength intact in all 4 extremities  Labs on Admission:  Basic Metabolic Panel:  Recent Labs Lab 12/16/12 2000 12/18/12 1628  NA 139 140  K 4.0 3.8  CL 101 100  CO2 29 31  GLUCOSE 119* 106*  BUN 15 17  CREATININE 0.95 0.90  CALCIUM 9.5 9.7   Liver Function Tests:  Recent Labs Lab 12/16/12 2000  AST 17  ALT 17  ALKPHOS 84  BILITOT 0.3  PROT 7.3  ALBUMIN 3.6   No results found for this basename: LIPASE, AMYLASE,  in the last 168 hours No results found for this basename: AMMONIA,  in the last 168 hours CBC:  Recent Labs Lab 12/16/12 2000  WBC 8.7  NEUTROABS 5.8  HGB 12.9  HCT 36.3  MCV 86.0  PLT 241   Cardiac Enzymes:  Recent Labs Lab 12/16/12 2000  TROPONINI <0.30    BNP (last 3 results) No results found for this basename: PROBNP,  in the last 8760 hours CBG: No results found for this basename: GLUCAP,  in the last 168 hours  Radiological Exams on Admission: Ct Head (brain) Wo Contrast  12/16/2012   *RADIOLOGY REPORT*  Clinical Data: Dizziness.  CT HEAD WITHOUT CONTRAST  Technique:  Contiguous axial images were obtained from the base of the skull through the vertex without  contrast.  Comparison: 08/15/2012  Findings: No evidence for acute hemorrhage, mass lesion, midline shift, hydrocephalus or large infarct.  There is mild atrophy.  Low density in the periventricular and subcortical white matter suggests chronic changes.  No acute bony abnormality.  The visualized sinuses are clear.  IMPRESSION: No acute intracranial abnormality.  Atrophy and evidence for chronic small vessel ischemic changes.   Original Report Authenticated By: Richarda Overlie, M.D.   Mr Brain Wo Contrast  12/18/2012   *RADIOLOGY REPORT*  Clinical Data: 79 year old female with vertigo and nausea.  MRI HEAD WITHOUT CONTRAST  Technique:  Multiplanar, multiecho pulse sequences of the brain and surrounding structures were  obtained according to standard protocol without intravenous contrast.  Comparison: Head CTs without contrast 12/16/2012 and earlier.  Findings: Cerebral volume is within normal limits for age.  No restricted diffusion to suggest acute infarction.  No midline shift, mass effect, evidence of mass lesion, ventriculomegaly, extra-axial collection or acute intracranial hemorrhage. Cervicomedullary junction and pituitary are within normal limits. Negative visualized cervical spine. Major intracranial vascular flow voids are preserved.  Gray-white matter signal throughout the brain is within normal limits for age.  Incidental coronoid plexus cysts.  No cortical encephalomalacia.  Grossly normal visualized internal auditory structures.  Mastoids are clear.  Postoperative changes to the globes.  Minor paranasal sinus mucosal thickening.  Negative scalp soft tissues.  Normal bone marrow signal.  IMPRESSION: No acute intracranial abnormality.  Negative for age noncontrast MRI appearance of the brain.   Original Report Authenticated By: Erskine Speed, M.D.    EKG: Independently reviewed. Sinus rhythm at 57 beats per minute with a first degree AV block  Assessment/Plan Active Problems:    Vertigo,  peripheral -Meclizine when necessary, additional Ativan if meclizine is ineffective -PT eval in the morning    Hypertension -Continue HCTZ    Hypothyroidism -Continue Synthroid    Dementia    Left leg pain -Continue Robaxin when necessary    Code Status: Full code-discussed with patient in presence of her son and daughter-in-law -Apparently patient has a living will Family Communication: Son and daughter-in-law  Disposition Plan: 24 hours  Time spent: 45 minutes  [facility] Pager [phone]  If 7PM-7AM, please contact night-coverage [email or web address] Password [facility] 12/18/2012, 8:15 PM

## 2012-12-18 NOTE — ED Notes (Signed)
Pt transported to MRI 

## 2012-12-18 NOTE — ED Notes (Signed)
Pt reports headache pain that started today. States she "does not feel right"

## 2012-12-18 NOTE — ED Notes (Signed)
Per ems: pt from home, c/o dizziness, lightheadedness/ generalized weakness, and nausea that began today. Was treated at [facility] last Thursday for UTI - prescribed antibiotic but has not taken any meds today d/t feeling sick.  Pt not orthostatic. Also c/o muscle cramps to left leg. bp 140/70, pulse 62 (L BBB on monitor) cbg 129.

## 2012-12-18 NOTE — ED Notes (Signed)
ZOX:[id number]<AV> Expected date:12/18/12<BR> Expected time: 2:58 PM<BR> Means of arrival:Ambulance<BR> Comments:<BR> ? Dehydration

## 2012-12-18 NOTE — ED Notes (Addendum)
PT ambulated in room with assistance of this NT and  MD Jacubowitz. Pt experienced dizziness ("room spinning") and unsteady gait.

## 2012-12-18 NOTE — ED Notes (Signed)
Pt returned from MRI °

## 2012-12-19 DIAGNOSIS — H81399 Other peripheral vertigo, unspecified ear: Secondary | ICD-10-CM

## 2012-12-19 DIAGNOSIS — E039 Hypothyroidism, unspecified: Secondary | ICD-10-CM

## 2012-12-19 DIAGNOSIS — I10 Essential (primary) hypertension: Secondary | ICD-10-CM

## 2012-12-19 DIAGNOSIS — F0390 Unspecified dementia without behavioral disturbance: Secondary | ICD-10-CM

## 2012-12-19 DIAGNOSIS — M79609 Pain in unspecified limb: Secondary | ICD-10-CM

## 2012-12-19 LAB — TSH: TSH: 2.502 u[IU]/mL (ref 0.350–4.500)

## 2012-12-19 MED ORDER — LISINOPRIL 10 MG PO TABS
10.0000 mg | ORAL_TABLET | Freq: Every day | ORAL | Status: DC
  Administered 2012-12-19 – 2012-12-21 (×3): 10 mg via ORAL
  Filled 2012-12-19 (×3): qty 1

## 2012-12-19 NOTE — Evaluation (Signed)
Physical Therapy Evaluation Patient Details Name: [name] MRN: [id number] DOB: June 04, 1920 Today's Date: 12/19/2012 Time: 0865-7846 PT Time Calculation (min): 44 min  PT Assessment / Plan / Recommendation History of Present Illness  Presents with intermittent dizziness.  Performed positional testing, however did not provoke nystagmus.  She has some dizziness when sitting back up, however was inconsistent.  Educated pt on [employer] exercises for habituation.    Clinical Impression  Tested smooth pursuits and saccades with no abnormalities noted and no dizziness provoked.  Also performed R and L [place] and R/L horizontal testing with no dizziness or nystagmus.  She did tend to state dizziness with sitting back up, however no nystagmus noted.  Educated on [employer] exercises for habituation with return demonstration from pt.  BP within functional limits during all positional changes.  RN notified of results.  Pt will benefit from skilled PT in acute venue to address deficits.  PT recommends HHPT for follow up at D/C to maximize pts safety and function.     PT Assessment  Patient needs continued PT services    Follow Up Recommendations  Home health PT    Does the patient have the potential to tolerate intense rehabilitation      Barriers to Discharge        Equipment Recommendations  None recommended by PT    Recommendations for Other Services     Frequency Min 3X/week    Precautions / Restrictions Precautions Precautions: Fall Restrictions Weight Bearing Restrictions: No   Pertinent Vitals/Pain No pain      Mobility  Bed Mobility Bed Mobility: Supine to Sit Supine to Sit: 6: Modified independent (Device/Increase time) Details for Bed Mobility Assistance: increased time Transfers Transfers: Sit to Stand;Stand to Sit Sit to Stand: 4: Min guard;With upper extremity assist;From bed Stand to Sit: 4: Min guard;With upper extremity assist;With armrests;To  bed;To chair/3-in-1 Details for Transfer Assistance: Performed several times at min/guard assist for safety and cues for hand placement and keeping RW with her.  Ambulation/Gait Ambulation/Gait Assistance: 4: Min guard Ambulation Distance (Feet): 200 Feet Assistive device: Rolling walker Ambulation/Gait Assistance Details: Cues for maintaining position inside of RW and for upright posture and pt keeps head down most of time.  Gait Pattern: Step-through pattern;Decreased stride length;Trunk flexed Gait velocity: decreased Stairs: No Wheelchair Mobility Wheelchair Mobility: No    Exercises     PT Diagnosis: Generalized weakness;Difficulty walking  PT Problem List: Decreased balance;Decreased mobility;Decreased knowledge of use of DME PT Treatment Interventions: DME instruction;Gait training;Functional mobility training;Therapeutic activities;Therapeutic exercise;Balance training;Patient/family education     PT Goals(Current goals can be found in the care plan section) Acute Rehab PT Goals Patient Stated Goal: to return home, maintain independence PT Goal Formulation: With patient Time For Goal Achievement: 12/25/12 Potential to Achieve Goals: Good  Visit Information  Last PT Received On: 12/19/12 Assistance Needed: +1 History of Present Illness: Presents with intermittent dizziness.  Performed positional testing, however did not provoke nystagmus.  She has some dizziness when sitting back up, however was inconsistent.  Educated pt on [employer] exercises for habituation.         Prior Functioning  Home Living Family/patient expects to be discharged to:: Private residence Living Arrangements: Alone Available Help at Discharge: Family;Available PRN/intermittently Type of Home: Apartment Home Access: Elevator Home Layout: One level Home Equipment: Walker - 4 wheels;Cane - quad;Hospital bed Prior Function Level of Independence: Independent with assistive  device(s) Communication Communication: No difficulties Dominant Hand: Right  Cognition  Cognition Arousal/Alertness: Awake/alert Behavior During Therapy: WFL for tasks assessed/performed Overall Cognitive Status: Within Functional Limits for tasks assessed    Extremity/Trunk Assessment Lower Extremity Assessment Lower Extremity Assessment: Generalized weakness Cervical / Trunk Assessment Cervical / Trunk Assessment: Kyphotic   Balance High Level Balance High Level Balance Activites: Head turns High Level Balance Comments: Pt with decreased  balance and increased dizziness with up/down head turns during amb.   End of Session PT - End of Session Equipment Utilized During Treatment: Gait belt Activity Tolerance: Patient tolerated treatment well (intermittent dizziness) Patient left: in chair;with call bell/phone within reach Nurse Communication: Mobility status (results of BPPV testing)  GP Functional Assessment Tool Used: Clinical judgement Functional Limitation: Mobility: Walking and moving around Mobility: Walking and Moving Around Current Status ([id number]): At least 1 percent but less than 20 percent impaired, limited or restricted Mobility: Walking and Moving Around Goal Status [phone]): 0 percent impaired, limited or restricted   Vista Deck 12/19/2012, 10:45 AM

## 2012-12-19 NOTE — Progress Notes (Addendum)
Pt sitting up in chair, states feeling better than yesterday but still alittle dizzy. States she feels better because she ate breakfast. Alert and oriented, very talkative. Pt has been caught getting up by her self and walking to the bathroom. Instructed several times to please use the call light for help.

## 2012-12-19 NOTE — Progress Notes (Signed)
Subjective: Patient admitted this morning with dizziness. Feels better. Has been taking HCTZ for past 2 months. Filed Vitals:   12/19/12 0500  BP: 122/88  Pulse: 52  Temp: 97.5 F (36.4 C)  Resp: 16    Chest: Clear Bilaterally Heart : S1S2 RRR Abdomen: Soft, nontender Ext : No edema Neuro: Alert, oriented x 3  A/P Dizziness- Continue meclizine prn.Will discontinue HCTZ, as it can cause dizziness. will start Lisinopril 10 mg po daily. Hypertension- Will start lisinopril 10 mg po daily. Hypothyroidism- Continue Synthroid. Leg cramps- Check magnesium level. DVT Prophylaxis- Heparin.  Meredeth Ide [facility] [phone]

## 2012-12-20 DIAGNOSIS — R42 Dizziness and giddiness: Secondary | ICD-10-CM

## 2012-12-20 DIAGNOSIS — G2581 Restless legs syndrome: Secondary | ICD-10-CM

## 2012-12-20 LAB — MAGNESIUM: Magnesium: 2.1 mg/dL (ref 1.5–2.5)

## 2012-12-20 MED ORDER — MAGNESIUM OXIDE 400 (241.3 MG) MG PO TABS
400.0000 mg | ORAL_TABLET | Freq: Every day | ORAL | Status: DC
  Administered 2012-12-20 – 2012-12-21 (×2): 400 mg via ORAL
  Filled 2012-12-20 (×2): qty 1

## 2012-12-20 NOTE — Progress Notes (Signed)
Pt  Using waker to ambulate to the bathroom, states the dizziness is much better today. Sitting up in chair eating breakfast.

## 2012-12-20 NOTE — Progress Notes (Signed)
Physical Therapy Treatment Patient Details Name: [name] MRN: [id number] DOB: May 18, 1921 Today's Date: 12/20/2012 Time: 7829-5621 PT Time Calculation (min): 26 min  PT Assessment / Plan / Recommendation  PT Comments   Pt did not sleep well last night and has some fatigue this am and c/o left leg pain She was able participate with PT and was left in right sidelying with pt hopeful she will get a nap and feel better.  Pt still wants to go home with HHPT and nearby familiy.  She will benefit from continued PT to increase general strength  Follow Up Recommendations  Home health PT     Does the patient have the potential to tolerate intense rehabilitation     Barriers to Discharge        Equipment Recommendations  None recommended by PT (pt reports she has a walker at home)    Recommendations for Other Services    Frequency Min 3X/week   Progress towards PT Goals Progress towards PT goals: Progressing toward goals  Plan Current plan remains appropriate    Precautions / Restrictions Precautions Precautions: Fall Restrictions Weight Bearing Restrictions: No   Pertinent Vitals/Pain Pain in left leg with mobility due to ?    Mobility  Bed Mobility Bed Mobility: Sit to Supine Supine to Sit: 6: Modified independent (Device/Increase time) Details for Bed Mobility Assistance: increased time Transfers Transfers: Sit to Stand;Stand to Sit Sit to Stand: With upper extremity assist;5: Supervision;From chair/3-in-1 Stand to Sit: With upper extremity assist;With armrests;To bed;To chair/3-in-1;5: Supervision Details for Transfer Assistance: repeated sit to stand with no balance loss.  Pt did express some fatigue Ambulation/Gait Ambulation/Gait Assistance: 5: Supervision Assistive device: Rolling walker Ambulation/Gait Assistance Details: 50 Gait Pattern: Step-through pattern;Decreased stride length;Trunk flexed Gait velocity: decreased General Gait Details: Pt with fatigue due to  not sleeping well.  She also c/o pain in left leg.  She reports she had polio as a child and her left leg has always been weak Stairs: No Wheelchair Mobility Wheelchair Mobility: No    Exercises General Exercises - Lower Extremity Ankle Circles/Pumps: AROM;Both;10 reps;Seated Gluteal Sets: AROM;Both;5 reps;Standing Short Arc Quad: AROM;Both;5 reps;Supine Hip ABduction/ADduction: AROM;Both;5 reps;Supine Hip Flexion/Marching: AROM;Both;Supine;10 reps;Seated Heel Raises: AROM;Both;5 reps;Seated Other Exercises Other Exercises: bilateral UE hip to hip in sitting with back unsupported, standing and supine to activate core   PT Diagnosis:    PT Problem List:   PT Treatment Interventions:     PT Goals (current goals can now be found in the care plan section) Acute Rehab PT Goals Patient Stated Goal: to return home, maintain independence  Visit Information  Last PT Received On: 12/20/12 History of Present Illness: Presents with intermittent dizziness,but improving.  RN reports pt still somewhat hypotensive. Pt reports she did not sleep well last night.  She still wants to go home with HHPT    Subjective Data  Patient Stated Goal: to return home, maintain independence   Cognition  Cognition Arousal/Alertness: Awake/alert Behavior During Therapy: WFL for tasks assessed/performed Overall Cognitive Status: Within Functional Limits for tasks assessed    Balance     End of Session PT - End of Session Activity Tolerance: Patient limited by fatigue (intermittent dizziness) Patient left: with call bell/phone within reach;in bed;with bed alarm set Nurse Communication:  (results of BPPV testing)   GP    Bayard Hugger. [place], [place] [phone] 12/20/2012, 10:09 AM

## 2012-12-20 NOTE — Care Management Note (Signed)
CARE MANAGEMENT NOTE 12/20/2012  Patient:  SAIDAH, KEMPTON   Account Number:  [id number]  Date Initiated:  12/20/2012  Documentation initiated by:  Leler Brion  Subjective/Objective Assessment:   79 yo female admitted with vertigo. PCP: Danise Edge, MD     Action/Plan:   Home when stable.   Anticipated DC Date:     Anticipated DC Plan:  HOME W HOME HEALTH SERVICES      DC Planning Services  CM consult      Choice offered to / List presented to:  C-1 Patient        HH arranged  HH-2 PT      Status of service:  In process, will continue to follow Medicare Important Message given?   (If response is "NO", the following Medicare IM given date fields will be blank) Date Medicare IM given:   Date Additional Medicare IM given:    Discharge Disposition:    Per UR Regulation:  Reviewed for med. necessity/level of care/duration of stay  If discussed at Long Length of Stay Meetings, dates discussed:    Comments:  12/20/12 1400 Kimi Kroft,RN,BSN [phone] Chart reviewed for utilization of services. PT eval for HHPT. Cm to arrange [facility] services prior discharge.

## 2012-12-20 NOTE — Progress Notes (Signed)
[facility] PROGRESS NOTE  [name] ZOX:[id number] DOB: [birth year] DOA: 12/18/2012 PCP: Danise Edge, MD  Assessment/Plan: Dizziness-  Secondary to orthostatic hypotension, Discontinued HCTZ, as it can cause dizziness.  started on Lisinopril 10 mg po daily.  Orthostatic hypotension- will start ted hose. Hypertension- Will start lisinopril 10 mg po daily.  ? UTI- Continue with Keflex, started on 7/17 by the ED physician, urine culture is still pending. Hypothyroidism- Continue Synthroid.  Leg cramps- Start magnesium oxide 400 mg po daily. DVT Prophylaxis- Heparin.   Code Status: Full code Family Communication: *Discussed with son and daughter in law on phone Disposition Plan: Home when stable   Consultants:  None  Procedures:  None  Antibiotics:  Rocephim  HPI/Subjective: Patient seen  And examined, feels better. Dizziness has improved, she has positive orthostatic hypotension.We discontinued the HCTZ and she was started on Lisinopril  Objective: Filed Vitals:   12/19/12 1745 12/19/12 2100 12/19/12 2200 12/20/12 0500  BP: 98/60 110/48 100/60 102/58  Pulse:  65  66  Temp:  97.9 F (36.6 C)  97.7 F (36.5 C)  TempSrc:  Oral  Oral  Resp:  18  16  Height:      Weight:      SpO2:  98%  95%    Intake/Output Summary (Last 24 hours) at 12/20/12 0838 Last data filed at 12/19/12 2124  Gross per 24 hour  Intake   1440 ml  Output    400 ml  Net   1040 ml   Filed Weights   12/18/12 2105  Weight: 82 kg (180 lb 12.4 oz)    Exam:   General:  Appear in no acute distress  Cardiovascular: *S1s2 RRR  Respiratory: *Clear bilaterally  Abdomen: *Soft, nontender  Musculoskeletal: *No edema of the lower extremities  Data Reviewed: Basic Metabolic Panel:  Recent Labs Lab 12/16/12 2000 12/18/12 1628 12/18/12 2155 12/20/12 0406  NA 139 140  --   --   K 4.0 3.8  --   --   CL 101 100  --   --   CO2 29 31  --   --   GLUCOSE 119* 106*  --   --   BUN  15 17  --   --   CREATININE 0.95 0.90 0.86  --   CALCIUM 9.5 9.7  --   --   MG  --   --   --  2.1   Liver Function Tests:  Recent Labs Lab 12/16/12 2000  AST 17  ALT 17  ALKPHOS 84  BILITOT 0.3  PROT 7.3  ALBUMIN 3.6   No results found for this basename: LIPASE, AMYLASE,  in the last 168 hours No results found for this basename: AMMONIA,  in the last 168 hours CBC:  Recent Labs Lab 12/16/12 2000 12/18/12 2155  WBC 8.7 8.6  NEUTROABS 5.8  --   HGB 12.9 12.5  HCT 36.3 36.1  MCV 86.0 86.0  PLT 241 237   Cardiac Enzymes:  Recent Labs Lab 12/16/12 2000  TROPONINI <0.30   BNP (last 3 results) No results found for this basename: PROBNP,  in the last 8760 hours CBG: No results found for this basename: GLUCAP,  in the last 168 hours  No results found for this or any previous visit (from the past 240 hour(s)).   Studies: Mr Sherrin Daisy Contrast  12/18/2012   *RADIOLOGY REPORT*  Clinical Data: 79 year old female with vertigo and nausea.  MRI HEAD WITHOUT CONTRAST  Technique:  Multiplanar, multiecho pulse sequences of the brain and surrounding structures were obtained according to standard protocol without intravenous contrast.  Comparison: Head CTs without contrast 12/16/2012 and earlier.  Findings: Cerebral volume is within normal limits for age.  No restricted diffusion to suggest acute infarction.  No midline shift, mass effect, evidence of mass lesion, ventriculomegaly, extra-axial collection or acute intracranial hemorrhage. Cervicomedullary junction and pituitary are within normal limits. Negative visualized cervical spine. Major intracranial vascular flow voids are preserved.  Gray-white matter signal throughout the brain is within normal limits for age.  Incidental coronoid plexus cysts.  No cortical encephalomalacia.  Grossly normal visualized internal auditory structures.  Mastoids are clear.  Postoperative changes to the globes.  Minor paranasal sinus mucosal thickening.   Negative scalp soft tissues.  Normal bone marrow signal.  IMPRESSION: No acute intracranial abnormality.  Negative for age noncontrast MRI appearance of the brain.   Original Report Authenticated By: Erskine Speed, M.D.    Scheduled Meds: . cephALEXin  500 mg Oral QID  . heparin  5,000 Units Subcutaneous Q8H  . levothyroxine  50 mcg Oral Daily  . lisinopril  10 mg Oral Daily  . magnesium oxide  400 mg Oral Daily  . meclizine  12.5 mg Oral TID   Continuous Infusions:   Active Problems:   Hypertension   Hypothyroidism   Dementia   Left leg pain   Vertigo, peripheral    Time spent: 35 min    [facility]  [facility] Pager [phone]. If 7PM-7AM, please contact night-coverage at [email or web address], password [facility] 12/20/2012, 8:38 AM  LOS: 2 days

## 2012-12-21 DIAGNOSIS — E039 Hypothyroidism, unspecified: Secondary | ICD-10-CM

## 2012-12-21 DIAGNOSIS — M79609 Pain in unspecified limb: Secondary | ICD-10-CM

## 2012-12-21 DIAGNOSIS — F0390 Unspecified dementia without behavioral disturbance: Secondary | ICD-10-CM

## 2012-12-21 DIAGNOSIS — H81399 Other peripheral vertigo, unspecified ear: Secondary | ICD-10-CM

## 2012-12-21 DIAGNOSIS — I10 Essential (primary) hypertension: Secondary | ICD-10-CM

## 2012-12-21 LAB — URINE CULTURE: Culture: NO GROWTH

## 2012-12-21 MED ORDER — MAGNESIUM OXIDE 400 (241.3 MG) MG PO TABS: 400.0000 mg | ORAL_TABLET | Freq: Every day | ORAL | Status: DC

## 2012-12-21 MED ORDER — LISINOPRIL 10 MG PO TABS: 10.0000 mg | ORAL_TABLET | Freq: Every day | ORAL | Status: DC

## 2012-12-21 MED ORDER — HYDROMORPHONE HCL PF 1 MG/ML IJ SOLN
INTRAMUSCULAR | Status: AC
  Filled 2012-12-21: qty 1

## 2012-12-21 NOTE — Discharge Summary (Signed)
Physician Discharge Summary  JAUNITA MIKELS AOZ:[id number] DOB: 18-Jan-1921 DOA: 12/18/2012  PCP: Danise Edge, MD  Admit date: 12/18/2012 Discharge date: 12/21/2012  Time spent: 50* minutes  Recommendations for Outpatient Follow-up:  1. Follow up PCP in 2 weeks  Discharge Diagnoses:  Active Problems:   Hypertension   Hypothyroidism   Dementia   Left leg pain   Vertigo, peripheral   Discharge Condition: *Stable  Diet recommendation: *Low salt diet  Filed Weights   12/18/12 2105  Weight: 82 kg (180 lb 12.4 oz)    History of present illness:  79 y.o. female with a history of hypertension. She presents with dizziness for the past 2-3 days. She came to the ER on the 17th and at that time was diagnosed with a UTI which was suspected to be the cause of her dizziness. She was discharged with Keflex. She states that she failed them and started taking them yesterday she took 3 doses yesterday but has not taken any today. Despite taking the Keflex her dizziness persisted and maybe even got a little worse.  In the ER today she was evaluated for vertigo with an MRI which was within normal limits. Despite being given meclizine and Ativan she still feels like she is spinning when she tries to get up. As she lives alone she is unsafe to go home and therefore will need to be admitted until symptoms improve. She is also a bit forgetful and much of the history is obtained from her son and daughter-in-law.   Hospital Course:  Dizziness- Resolved, secondary to orthostatic hypotension, Will discontinue HCTZ, as it can cause dizziness and hypotension. will start Lisinopril 10 mg po daily.  Hypertension- BP well controlled.Continue with  lisinopril 10 mg po daily.  Orthostatic hypotension- resolved. Hypothyroidism- Continue Synthroid.  Leg cramps- Resolved after starting magnesium, will continue with magnesium oxide 400 mg po daily. Deconditioning- will need PT at home. Urine culture growing  morphophytes, will discontinue keflex.  Procedures:  None  Consultations:  None  Discharge Exam: Filed Vitals:   12/20/12 2035 12/20/12 2038 12/21/12 0523 12/21/12 0525  BP: 121/59 135/68 119/59 128/57  Pulse: 66 67 68 59  Temp: 98.4 F (36.9 C)  97.7 F (36.5 C)   TempSrc: Oral  Oral   Resp: 18  16   Height:      Weight:      SpO2: 97%  98%     General: *Appear in no acute distress Cardiovascular: *s1s2 RRR Respiratory: *Clear bilaterally Ext : No edema  Discharge Instructions  Discharge Orders   Future Appointments Provider Department Dept Phone   12/28/2012 1:00 PM Bradd Canary, MD [facility] [phone]   Future Orders Complete By Expires     Diet - low sodium heart healthy  As directed     Increase activity slowly  As directed         Medication List    STOP taking these medications       cephALEXin 500 MG capsule  Commonly known as:  KEFLEX     hydrochlorothiazide 12.5 MG capsule  Commonly known as:  MICROZIDE     methocarbamol 500 MG tablet  Commonly known as:  ROBAXIN      TAKE these medications       levothyroxine 50 MCG tablet  Commonly known as:  SYNTHROID, LEVOTHROID  Take 1 tablet (50 mcg total) by mouth daily.     lisinopril 10 MG tablet  Commonly known  as:  PRINIVIL,ZESTRIL  Take 1 tablet (10 mg total) by mouth daily.     LORazepam 1 MG tablet  Commonly known as:  ATIVAN  Take 1 tablet (1 mg total) by mouth at bedtime as needed for anxiety (for anxiety).     magnesium oxide 400 (241.3 MG) MG tablet  Commonly known as:  MAG-OX  Take 1 tablet (400 mg total) by mouth daily.       Allergies  Allergen Reactions  . Codeine Rash  . Hydrocodone Rash      The results of significant diagnostics from this hospitalization (including imaging, microbiology, ancillary and laboratory) are listed below for reference.    Significant Diagnostic Studies: Ct Head (brain) Wo Contrast  12/16/2012   *RADIOLOGY  REPORT*  Clinical Data: Dizziness.  CT HEAD WITHOUT CONTRAST  Technique:  Contiguous axial images were obtained from the base of the skull through the vertex without contrast.  Comparison: 08/15/2012  Findings: No evidence for acute hemorrhage, mass lesion, midline shift, hydrocephalus or large infarct.  There is mild atrophy.  Low density in the periventricular and subcortical white matter suggests chronic changes.  No acute bony abnormality.  The visualized sinuses are clear.  IMPRESSION: No acute intracranial abnormality.  Atrophy and evidence for chronic small vessel ischemic changes.   Original Report Authenticated By: Richarda Overlie, M.D.   Mr Brain Wo Contrast  12/18/2012   *RADIOLOGY REPORT*  Clinical Data: 79 year old female with vertigo and nausea.  MRI HEAD WITHOUT CONTRAST  Technique:  Multiplanar, multiecho pulse sequences of the brain and surrounding structures were obtained according to standard protocol without intravenous contrast.  Comparison: Head CTs without contrast 12/16/2012 and earlier.  Findings: Cerebral volume is within normal limits for age.  No restricted diffusion to suggest acute infarction.  No midline shift, mass effect, evidence of mass lesion, ventriculomegaly, extra-axial collection or acute intracranial hemorrhage. Cervicomedullary junction and pituitary are within normal limits. Negative visualized cervical spine. Major intracranial vascular flow voids are preserved.  Gray-white matter signal throughout the brain is within normal limits for age.  Incidental coronoid plexus cysts.  No cortical encephalomalacia.  Grossly normal visualized internal auditory structures.  Mastoids are clear.  Postoperative changes to the globes.  Minor paranasal sinus mucosal thickening.  Negative scalp soft tissues.  Normal bone marrow signal.  IMPRESSION: No acute intracranial abnormality.  Negative for age noncontrast MRI appearance of the brain.   Original Report Authenticated By: Erskine Speed, M.D.    Nm Bone Scan 3 Phase Lower Extremity  11/23/2012   *RADIOLOGY REPORT*  Clinical Data: Left knee pain and left hip pain  NUCLEAR MEDICINE THREE PHASE BONE SCAN  Technique:  Radionuclide angiographic images, immediate static blood pool images, and 3-hour delayed static images were obtained after intravenous injection of radiopharmaceutical.  Radiopharmaceutical: CURIE TC-MDP TECHNETIUM TC 62M MEDRONATE IV KIT  Comparison: None.  Findings:  Vascular phase: No  asymmetric or increased vascular flow in the left or right knee.  Blood pool phase:  No asymmetric increased blood pool activity within the left or right knee.  Delayed phase:  No asymmetric increased delayed phase active within the left or right hip or left or right knee. Mild degenerative uptake within the left and right knee.  IMPRESSION: No evidence of fracture within the left hip or left knee.  Mild degenerative uptake within the knees.   Original Report Authenticated By: Genevive Bi, M.D.    Microbiology: Recent Results (from the past 240 hour(s))  URINE  CULTURE     Status: None   Collection Time    12/19/12 10:50 PM      Result Value Range Status   Specimen Description URINE, RANDOM   Final   Special Requests NONE   Final   Culture  Setup Time 12/20/2012 11:06   Final   Colony Count NO GROWTH   Final   Culture NO GROWTH   Final   Report Status 12/21/2012 FINAL   Final     Labs: Basic Metabolic Panel:  Recent Labs Lab 12/16/12 2000 12/18/12 1628 12/18/12 2155 12/20/12 0406  NA 139 140  --   --   K 4.0 3.8  --   --   CL 101 100  --   --   CO2 29 31  --   --   GLUCOSE 119* 106*  --   --   BUN 15 17  --   --   CREATININE 0.95 0.90 0.86  --   CALCIUM 9.5 9.7  --   --   MG  --   --   --  2.1   Liver Function Tests:  Recent Labs Lab 12/16/12 2000  AST 17  ALT 17  ALKPHOS 84  BILITOT 0.3  PROT 7.3  ALBUMIN 3.6   No results found for this basename: LIPASE, AMYLASE,  in the last 168 hours No results  found for this basename: AMMONIA,  in the last 168 hours CBC:  Recent Labs Lab 12/16/12 2000 12/18/12 2155  WBC 8.7 8.6  NEUTROABS 5.8  --   HGB 12.9 12.5  HCT 36.3 36.1  MCV 86.0 86.0  PLT 241 237   Cardiac Enzymes:  Recent Labs Lab 12/16/12 2000  TROPONINI <0.30   BNP: BNP (last 3 results) No results found for this basename: PROBNP,  in the last 8760 hours CBG: No results found for this basename: GLUCAP,  in the last 168 hours     Signed:  Kaydenn Mclear S  [facility] 12/21/2012, 9:23 AM

## 2012-12-21 NOTE — Progress Notes (Signed)
Physical Therapy Treatment Patient Details Name: [name] MRN: [id number] DOB: 1920-07-20 Today's Date: 12/21/2012 Time: 3664-4034 PT Time Calculation (min): 23 min  PT Assessment / Plan / Recommendation  History of Present Illness 79 y.o. female admitted with vertigo.    Clinical Impression *Pt progressing well with mobility. OK to DC home from PT standpoint. She walked 200' with RW without dizziness or LOB. Pt reports LLE pain and "knots in calf" has resolved, but still feels weak in LLE. Instructed pt in LE exercises. **   PT Comments   **Progressing well*  Follow Up Recommendations  Home health PT     Does the patient have the potential to tolerate intense rehabilitation     Barriers to Discharge        Equipment Recommendations  None recommended by PT (pt reports she has a walker at home)    Recommendations for Other Services    Frequency Min 3X/week   Progress towards PT Goals Progress towards PT goals: Progressing toward goals  Plan Current plan remains appropriate    Precautions / Restrictions Precautions Precautions: Fall Restrictions Weight Bearing Restrictions: No   Pertinent Vitals/Pain *0/10 pain**    Mobility  Bed Mobility Bed Mobility: Supine to Sit Supine to Sit: 6: Modified independent (Device/Increase time);With rails Transfers Transfers: Sit to Stand;Stand to Sit Sit to Stand: With upper extremity assist;6: Modified independent (Device/Increase time);From bed Stand to Sit: With upper extremity assist;With armrests;To chair/3-in-1;6: Modified independent (Device/Increase time) Ambulation/Gait Ambulation/Gait Assistance: 5: Supervision Ambulation Distance (Feet): 200 Feet Assistive device: Rolling walker Gait Pattern: Step-through pattern General Gait Details: Pt notes LLE pain and "knots in calf" are better. Pt walks with neck flexed, VCs to look up. Steady, no LOB. No c/o vertigo while walking. Stairs: No Wheelchair Mobility Wheelchair  Mobility: No    Exercises General Exercises - Lower Extremity Ankle Circles/Pumps: AROM;Both;10 reps;Seated Quad Sets: AROM;Both;10 reps Gluteal Sets: AROM;Both;10 reps;Supine Heel Slides: AROM;Right;10 reps Hip ABduction/ADduction: AROM;Supine;Right;10 reps   PT Diagnosis:    PT Problem List:   PT Treatment Interventions:     PT Goals (current goals can now be found in the care plan section) Acute Rehab PT Goals Patient Stated Goal: to return home, maintain independence PT Goal Formulation: With patient Time For Goal Achievement: 12/25/12 Potential to Achieve Goals: Good  Visit Information  Last PT Received On: 12/21/12 Assistance Needed: +1 History of Present Illness: 79 y.o. female admitted with vertigo.     Subjective Data  Patient Stated Goal: to return home, maintain independence   Cognition  Cognition Arousal/Alertness: Awake/alert Behavior During Therapy: WFL for tasks assessed/performed Overall Cognitive Status: Within Functional Limits for tasks assessed    Balance     End of Session PT - End of Session Activity Tolerance: Patient tolerated treatment well (intermittent dizziness) Patient left: with call bell/phone within reach;in chair Nurse Communication: Mobility status (results of BPPV testing)   GP     Tamala Ser 12/21/2012, 10:28 AM [phone]

## 2012-12-21 NOTE — Progress Notes (Signed)
Pt ready for d/c. Went over d/c instructions with pt and pt's son and daughter in Social worker. Instructed to start taking lisinopril instead of hydrochlorothiazide. Also instructed to stop taking Keflex and Robaxin. Pt had no IV access. No change in pt condition since AM assessment. Pt d/c'd to home via son. Eugene Garnet RN

## 2012-12-21 NOTE — Care Management Note (Signed)
CM spoke with patient at the bedside concerning discharge planning. MD order for RN/PT. Pt offered choice for [facility]. Per pt choice [facility] to provide [facility] services. [facility] liason [employer] notified of referral. Pt lives at an independent living complex. Pt uses a Rw at home for DME use. Pt's son to provide transportation home. PCP: Danise Edge, MD. No other needs identified.   Roxy Manns Charlise Giovanetti,RN,BSN [phone]

## 2012-12-27 ENCOUNTER — Ambulatory Visit: Payer: Medicare Other | Admitting: Family Medicine

## 2012-12-28 ENCOUNTER — Ambulatory Visit (INDEPENDENT_AMBULATORY_CARE_PROVIDER_SITE_OTHER): Payer: Medicare Other | Admitting: Family Medicine

## 2012-12-28 ENCOUNTER — Encounter: Payer: Self-pay | Admitting: Family Medicine

## 2012-12-28 VITALS — BP 132/76 | HR 57 | Temp 97.6°F | Ht 65.0 in | Wt 179.1 lb

## 2012-12-28 DIAGNOSIS — R739 Hyperglycemia, unspecified: Secondary | ICD-10-CM

## 2012-12-28 DIAGNOSIS — N390 Urinary tract infection, site not specified: Secondary | ICD-10-CM

## 2012-12-28 DIAGNOSIS — G4700 Insomnia, unspecified: Secondary | ICD-10-CM

## 2012-12-28 DIAGNOSIS — R7309 Other abnormal glucose: Secondary | ICD-10-CM

## 2012-12-28 DIAGNOSIS — E039 Hypothyroidism, unspecified: Secondary | ICD-10-CM

## 2012-12-28 DIAGNOSIS — I10 Essential (primary) hypertension: Secondary | ICD-10-CM

## 2012-12-28 LAB — RENAL FUNCTION PANEL
Albumin: 4.1 g/dL (ref 3.5–5.2)
CO2: 27 mEq/L (ref 19–32)
Chloride: 105 mEq/L (ref 96–112)
Phosphorus: 3.6 mg/dL (ref 2.3–4.6)
Sodium: 140 mEq/L (ref 135–145)

## 2012-12-28 LAB — HEMOGLOBIN A1C
Hgb A1c MFr Bld: 5.8 % — ABNORMAL HIGH (ref <5.7)
Mean Plasma Glucose: 120 mg/dL — ABNORMAL HIGH (ref <117)

## 2012-12-28 MED ORDER — LORAZEPAM 1 MG PO TABS: 1.0000 mg | ORAL_TABLET | Freq: Every evening | ORAL | Status: DC | PRN

## 2012-12-28 NOTE — Patient Instructions (Addendum)
Start some Mucinex 600 mg twice day x 10 days  Start a probiotic such as Digestive Advantage daily   Urinary Tract Infection Urinary tract infections (UTIs) can develop anywhere along your urinary tract. Your urinary tract is your body's drainage system for removing wastes and extra water. Your urinary tract includes two kidneys, two ureters, a bladder, and a urethra. Your kidneys are a pair of bean-shaped organs. Each kidney is about the size of your fist. They are located below your ribs, one on each side of your spine. CAUSES Infections are caused by microbes, which are microscopic organisms, including fungi, viruses, and bacteria. These organisms are so small that they can only be seen through a microscope. Bacteria are the microbes that most commonly cause UTIs. SYMPTOMS  Symptoms of UTIs may vary by age and gender of the patient and by the location of the infection. Symptoms in young women typically include a frequent and intense urge to urinate and a painful, burning feeling in the bladder or urethra during urination. Older women and men are more likely to be tired, shaky, and weak and have muscle aches and abdominal pain. A fever may mean the infection is in your kidneys. Other symptoms of a kidney infection include pain in your back or sides below the ribs, nausea, and vomiting. DIAGNOSIS To diagnose a UTI, your caregiver will ask you about your symptoms. Your caregiver also will ask to provide a urine sample. The urine sample will be tested for bacteria and white blood cells. White blood cells are made by your body to help fight infection. TREATMENT  Typically, UTIs can be treated with medication. Because most UTIs are caused by a bacterial infection, they usually can be treated with the use of antibiotics. The choice of antibiotic and length of treatment depend on your symptoms and the type of bacteria causing your infection. HOME CARE INSTRUCTIONS  If you were prescribed antibiotics,  take them exactly as your caregiver instructs you. Finish the medication even if you feel better after you have only taken some of the medication.  Drink enough water and fluids to keep your urine clear or pale yellow.  Avoid caffeine, tea, and carbonated beverages. They tend to irritate your bladder.  Empty your bladder often. Avoid holding urine for long periods of time.  Empty your bladder before and after sexual intercourse.  After a bowel movement, women should cleanse from front to back. Use each tissue only once. SEEK MEDICAL CARE IF:   You have back pain.  You develop a fever.  Your symptoms do not begin to resolve within 3 days. SEEK IMMEDIATE MEDICAL CARE IF:   You have severe back pain or lower abdominal pain.  You develop chills.  You have nausea or vomiting.  You have continued burning or discomfort with urination. MAKE SURE YOU:   Understand these instructions.  Will watch your condition.  Will get help right away if you are not doing well or get worse. Document Released: 02/26/2005 Document Revised: 11/18/2011 Document Reviewed: 06/27/2011 [facility] Patient Information 2014 [place], [place].  Salon Pas cream and patches for pain

## 2012-12-29 LAB — URINALYSIS
Glucose, UA: NEGATIVE mg/dL
Nitrite: NEGATIVE
Protein, ur: NEGATIVE mg/dL
pH: 5 (ref 5.0–8.0)

## 2012-12-29 NOTE — Progress Notes (Signed)
Quick Note:  Patient Informed and voiced understanding ______ 

## 2012-12-30 ENCOUNTER — Encounter: Payer: Self-pay | Admitting: Family Medicine

## 2012-12-30 DIAGNOSIS — N390 Urinary tract infection, site not specified: Secondary | ICD-10-CM

## 2012-12-30 HISTORY — DX: Urinary tract infection, site not specified: N39.0

## 2012-12-30 LAB — CULTURE, URINE COMPREHENSIVE: Colony Count: NO GROWTH

## 2012-12-30 NOTE — Assessment & Plan Note (Addendum)
Well controlled, no changes. Did recently have the hctz stopped and Lisinopril started, tolerating, renal panel wnl

## 2012-12-30 NOTE — Progress Notes (Signed)
Patient ID: IDY RAWLING, female   DOB: May 06, 1921, 79 y.o.   MRN: [id number] KAYDE ATKERSON [id number] 1921/05/24 12/30/2012      Progress Note-Follow Up  Subjective  Chief Complaint  Chief Complaint  Patient presents with  . Follow-up    2 month    HPI  Patient is a 79 year old Caucasian female who is here in hospital followup. She is a smoker in the hospital with UTI which was well treated. She was feeling weak and having leg cramps. That is improved. Her blood pressure was low so they stopped her HCTZ. She left the hospital on lisinopril which is working well. No cough or urinary complaints noted. She has had some mild congestion but no fevers. No headache, chest pain, palpitations or shortness of breath.  Past Medical History  Diagnosis Date  . Arthritis   . Chicken pox   . Chronic kidney disease   . Colon polyps   . Hypertension   . Dementia   . Angina   . Dysrhythmia   . Heart murmur   . Shortness of breath   . Recurrent upper respiratory infection (URI)   . Pneumonia   . Hypothyroidism   . Anemia   . Headache(784.0)   . Anxiety   . Depression   . Valvular heart disease 09/04/2012  . Left leg pain 10/02/2012  . UTI (urinary tract infection) 12/30/2012    Past Surgical History  Procedure Laterality Date  . Appendectomy    . Cholecystectomy    . Kidney stones    . Tonsillectomy    . Abdominal hysterectomy    . Hand surgery    . Feet surgery    . Shoulder arthroscopy    . Back surgery    . Tonsillectomy    . Eye surgery      cataract removed and eye lids lifted  . Fracture surgery      bilateral arms  . Tubal ligation    . Colonoscopy w/ polypectomy      Family History  Problem Relation Age of Onset  . Heart disease Mother   . Heart disease Father   . Heart disease Other   . Birth defects Other     History   Social History  . Marital Status: Widowed    Spouse Name: N/A    Number of Children: N/A  . Years of Education: N/A   Occupational  History  . Not on file.   Social History Main Topics  . Smoking status: Former Smoker -- 0.20 packs/day for 1 years    Types: Cigarettes    Quit date: 06/02/1941  . Smokeless tobacco: Never Used  . Alcohol Use: No  . Drug Use: No  . Sexually Active: Not Currently    Birth Control/ Protection: Abstinence   Other Topics Concern  . Not on file   Social History Narrative   Still lives at home and ambulatory daily. Doesn't use cane or walker, but has cane at home. Remote smoking history.     Current Outpatient Prescriptions on File Prior to Visit  Medication Sig Dispense Refill  . levothyroxine (SYNTHROID, LEVOTHROID) 50 MCG tablet Take 1 tablet (50 mcg total) by mouth daily.  30 tablet  3  . lisinopril (PRINIVIL,ZESTRIL) 10 MG tablet Take 1 tablet (10 mg total) by mouth daily.  30 tablet  2  . magnesium oxide (MAG-OX) 400 (241.3 MG) MG tablet Take 1 tablet (400 mg total) by mouth daily.  30 tablet  0   No current facility-administered medications on file prior to visit.    Allergies  Allergen Reactions  . Codeine Rash  . Hydrocodone Rash    Review of Systems  Review of Systems  Constitutional: Positive for chills. Negative for fever and malaise/fatigue.  HENT: Positive for congestion.   Eyes: Negative for pain and discharge.  Respiratory: Negative for shortness of breath.   Cardiovascular: Negative for chest pain, palpitations and leg swelling.  Gastrointestinal: Negative for nausea, abdominal pain and diarrhea.  Genitourinary: Negative for dysuria.  Musculoskeletal: Positive for myalgias. Negative for falls.  Skin: Negative for rash.  Neurological: Negative for loss of consciousness and headaches.  Endo/Heme/Allergies: Negative for polydipsia.  Psychiatric/Behavioral: Negative for depression and suicidal ideas. The patient is not nervous/anxious and does not have insomnia.     Objective  BP 132/76  Pulse 57  Temp(Src) 97.6 F (36.4 C) (Oral)  Ht 5\' 5"  (1.651 m)   Wt 179 lb 1.3 oz (81.23 kg)  BMI 29.8 kg/m2  SpO2 96%  Physical Exam  Physical Exam  Constitutional: She is oriented to person, place, and time and well-developed, well-nourished, and in no distress. No distress.  HENT:  Head: Normocephalic and atraumatic.  Eyes: Conjunctivae are normal.  Neck: Neck supple. No thyromegaly present.  Cardiovascular: Normal rate and regular rhythm.  Exam reveals no gallop.   No murmur heard. Pulmonary/Chest: Effort normal and breath sounds normal. She has no wheezes.  Abdominal: She exhibits no distension and no mass.  Musculoskeletal: She exhibits no edema.  Lymphadenopathy:    She has no cervical adenopathy.  Neurological: She is alert and oriented to person, place, and time.  Skin: Skin is warm and dry. No rash noted. She is not diaphoretic.  Psychiatric: Memory, affect and judgment normal.    Lab Results  Component Value Date   TSH 2.502 12/19/2012   Lab Results  Component Value Date   WBC 8.6 12/18/2012   HGB 12.5 12/18/2012   HCT 36.1 12/18/2012   MCV 86.0 12/18/2012   PLT 237 12/18/2012   Lab Results  Component Value Date   CREATININE 0.95 12/28/2012   BUN 16 12/28/2012   NA 140 12/28/2012   K 5.0 12/28/2012   CL 105 12/28/2012   CO2 27 12/28/2012   Lab Results  Component Value Date   ALT 17 12/16/2012   AST 17 12/16/2012   ALKPHOS 84 12/16/2012   BILITOT 0.3 12/16/2012   Lab Results  Component Value Date   CHOL 226* 02/25/2011   Lab Results  Component Value Date   HDL 41 02/25/2011   Lab Results  Component Value Date   LDLCALC 151* 02/25/2011   Lab Results  Component Value Date   TRIG 168* 02/25/2011   Lab Results  Component Value Date   CHOLHDL 5.5 02/25/2011     Assessment & Plan  Hypertension Well controlled, no changes. Did recently have the hctz stopped and Lisinopril started, tolerating, renal panel wnl  UTI (urinary tract infection) UA and culture negative. Encouraged probiotics and increase  fluid  Hypothyroidism Well controlled on current meds

## 2012-12-30 NOTE — Assessment & Plan Note (Signed)
Well-controlled on current meds 

## 2012-12-30 NOTE — Assessment & Plan Note (Signed)
UA and culture negative. Encouraged probiotics and increase fluid

## 2013-01-10 ENCOUNTER — Telehealth: Payer: Self-pay

## 2013-01-10 NOTE — Telephone Encounter (Signed)
Alaine with [facility] left a message stating that they have been working with the patient doing PT. Alaine needs an order to continue PT or discharge orders.  Please advise?  Alaine's callback [phone]

## 2013-01-10 NOTE — Telephone Encounter (Signed)
OK to give order to continue PT

## 2013-01-11 NOTE — Telephone Encounter (Signed)
Left a detailed message on v/m

## 2013-01-25 ENCOUNTER — Encounter: Payer: Self-pay | Admitting: Family Medicine

## 2013-01-25 ENCOUNTER — Ambulatory Visit (INDEPENDENT_AMBULATORY_CARE_PROVIDER_SITE_OTHER): Payer: Medicare Other | Admitting: Family Medicine

## 2013-01-25 VITALS — BP 138/70 | HR 68 | Temp 97.6°F | Ht 65.0 in | Wt 181.0 lb

## 2013-01-25 DIAGNOSIS — H81399 Other peripheral vertigo, unspecified ear: Secondary | ICD-10-CM

## 2013-01-25 DIAGNOSIS — F418 Other specified anxiety disorders: Secondary | ICD-10-CM

## 2013-01-25 DIAGNOSIS — B354 Tinea corporis: Secondary | ICD-10-CM

## 2013-01-25 DIAGNOSIS — I10 Essential (primary) hypertension: Secondary | ICD-10-CM

## 2013-01-25 DIAGNOSIS — N390 Urinary tract infection, site not specified: Secondary | ICD-10-CM

## 2013-01-25 DIAGNOSIS — R32 Unspecified urinary incontinence: Secondary | ICD-10-CM

## 2013-01-25 DIAGNOSIS — E039 Hypothyroidism, unspecified: Secondary | ICD-10-CM

## 2013-01-25 DIAGNOSIS — M549 Dorsalgia, unspecified: Secondary | ICD-10-CM

## 2013-01-25 DIAGNOSIS — F341 Dysthymic disorder: Secondary | ICD-10-CM

## 2013-01-25 LAB — URINALYSIS
Bilirubin Urine: NEGATIVE
Glucose, UA: NEGATIVE mg/dL
Hgb urine dipstick: NEGATIVE
Protein, ur: NEGATIVE mg/dL
Urobilinogen, UA: 0.2 mg/dL (ref 0.0–1.0)

## 2013-01-25 MED ORDER — CITALOPRAM HYDROBROMIDE 10 MG PO TABS: 10.0000 mg | ORAL_TABLET | Freq: Every day | ORAL | Status: DC

## 2013-01-25 MED ORDER — KETOCONAZOLE 2 % EX SHAM: MEDICATED_SHAMPOO | CUTANEOUS | Status: DC

## 2013-01-25 NOTE — Patient Instructions (Addendum)
[place] patches  Depression, Adult Depression refers to feeling sad, low, down in the dumps, blue, gloomy, or empty. In general, there are two kinds of depression: 1. Depression that we all experience from time to time because of upsetting life experiences, including the loss of a job or the ending of a relationship (normal sadness or normal grief). This kind of depression is considered normal, is short lived, and resolves within a few days to 2 weeks. (Depression experienced after the loss of a loved one is called bereavement. Bereavement often lasts longer than 2 weeks but normally gets better with time.) 2. Clinical depression, which lasts longer than normal sadness or normal grief or interferes with your ability to function at home, at work, and in school. It also interferes with your personal relationships. It affects almost every aspect of your life. Clinical depression is an illness. Symptoms of depression also can be caused by conditions other than normal sadness and grief or clinical depression. Examples of these conditions are listed as follows:  Physical illness Some physical illnesses, including underactive thyroid gland (hypothyroidism), severe anemia, specific types of cancer, diabetes, uncontrolled seizures, heart and lung problems, strokes, and chronic pain are commonly associated with symptoms of depression.  Side effects of some prescription medicine In some people, certain types of prescription medicine can cause symptoms of depression.  Substance abuse Abuse of alcohol and illicit drugs can cause symptoms of depression. SYMPTOMS Symptoms of normal sadness and normal grief include the following:  Feeling sad or crying for short periods of time.  Not caring about anything (apathy).  Difficulty sleeping or sleeping too much.  No longer able to enjoy the things you used to enjoy.  Desire to be by oneself all the time (social isolation).  Lack of energy or  motivation.  Difficulty concentrating or remembering.  Change in appetite or weight.  Restlessness or agitation. Symptoms of clinical depression include the same symptoms of normal sadness or normal grief and also the following symptoms:  Feeling sad or crying all the time.  Feelings of guilt or worthlessness.  Feelings of hopelessness or helplessness.  Thoughts of suicide or the desire to harm yourself (suicidal ideation).  Loss of touch with reality (psychotic symptoms). Seeing or hearing things that are not real (hallucinations) or having false beliefs about your life or the people around you (delusions and paranoia). DIAGNOSIS  The diagnosis of clinical depression usually is based on the severity and duration of the symptoms. Your caregiver also will ask you questions about your medical history and substance use to find out if physical illness, use of prescription medicine, or substance abuse is causing your depression. Your caregiver also may order blood tests. TREATMENT  Typically, normal sadness and normal grief do not require treatment. However, sometimes antidepressant medicine is prescribed for bereavement to ease the depressive symptoms until they resolve. The treatment for clinical depression depends on the severity of your symptoms but typically includes antidepressant medicine, counseling with a mental health professional, or a combination of both. Your caregiver will help to determine what treatment is best for you. Depression caused by physical illness usually goes away with appropriate medical treatment of the illness. If prescription medicine is causing depression, talk with your caregiver about stopping the medicine, decreasing the dose, or substituting another medicine. Depression caused by abuse of alcohol or illicit drugs abuse goes away with abstinence from these substances. Some adults need professional help in order to stop drinking or using drugs. SEEK IMMEDIATE  CARE IF:  You have thoughts about hurting yourself or others.  You lose touch with reality (have psychotic symptoms).  You are taking medicine for depression and have a serious side effect. FOR MORE INFORMATION National Alliance on Mental Illness: www.nami.[employer]: http://www.maynard.net/ Document Released: 05/16/2000 Document Revised: 11/18/2011 Document Reviewed: 08/18/2011 [facility] Patient Information 2014 [place], [place].   Fungus Infection of the Skin An infection of your skin caused by a fungus is a very common problem. Treatment depends on which part of the body is affected. Types of fungal skin infection include:  Athlete's Foot(Tinea pedis). This infection starts between the toes and may involve the entire sole and sides of foot. It is the most common fungal disease. It is made worse by heat, moisture, and friction. To treat, wash your feet 2 to 3 times daily. Dry thoroughly between the toes. Use medicated foot powder or cream as directed on the package. Plain talc, cornstarch, or rice powder may be dusted into socks and shoes to keep the feet dry. Wearing footwear that allows ventilation is also helpful.  Ringworm (Tinea corporis and tinea capitis). This infection causes scaly red rings to form on the skin or scalp. For skin sores, apply medicated lotion or cream as directed on the package. For the scalp, medicated shampoo may be used with with other therapies. Ringworm of the scalp or fingernails usually requires using oral medicine for 2 to 4 months.  Tinea versicolor. This infection appears as painless, scaly, patchy areas of discolored skin (whitish to light brown). It is more common in the summer and favors oily areas of the skin such as those found at the chest, abdomen, back, pubis, neck, and body folds. It can be treated with medicated shampoo or with medicated topical cream. Oral antifungals may be needed for more active infections. The light and/or  dark spots may take time to get better and is not a sign of treatment failure. Fungal infections may need to be treated for several weeks to be cured. It is important not to treat fungal infections with steroids or combination medicine that contains an antifungal and steroid as these will make the fungal infection worse. SEEK MEDICAL CARE IF:   You have persistent itching or rawness.  You have an oral temperature above 102 F (38.9 C). Document Released: 06/26/2004 Document Revised: 08/11/2011 Document Reviewed: 09/11/2009 [facility] Patient Information 2013 [place], [place].

## 2013-01-26 LAB — URINE CULTURE: Colony Count: NO GROWTH

## 2013-01-30 ENCOUNTER — Encounter: Payer: Self-pay | Admitting: Family Medicine

## 2013-01-30 NOTE — Assessment & Plan Note (Signed)
Resolved, urine culture negative.

## 2013-01-30 NOTE — Assessment & Plan Note (Signed)
Well treated, no changes 

## 2013-01-30 NOTE — Assessment & Plan Note (Signed)
Well controlled, no changes 

## 2013-01-30 NOTE — Assessment & Plan Note (Signed)
recnetly in ED with vertigo, symptoms are improved at this time.

## 2013-01-30 NOTE — Progress Notes (Signed)
Patient ID: [name], female   DOB: 03-07-1921, 79 y.o.   MRN: [id number] [name] [id number] 1921-02-12 01/30/2013      Progress Note-Follow Up  Subjective  Chief Complaint  Chief Complaint  Patient presents with  . Follow-up    3 week    HPI  Patient is a 79 year old female who is in today with family. She had a recent episode of vertigo and was seen in ED. That is resolved. She's not having any urinary symptoms at this time. No chest pain or palpitations. No shortness of breath, GI or GU complaints at this time. Did have some chest wall discomfort earlier in the week. Describes it as worse with twisting on the left side. No palpitations or shortness of breath associated. No diaphoresis or nausea. Otherwise feels well. No fevers or chills, abdominal or back pain.  Past Medical History  Diagnosis Date  . Arthritis   . Chicken pox   . Chronic kidney disease   . Colon polyps   . Hypertension   . Dementia   . Angina   . Dysrhythmia   . Heart murmur   . Shortness of breath   . Recurrent upper respiratory infection (URI)   . Pneumonia   . Hypothyroidism   . Anemia   . Headache(784.0)   . Anxiety   . Depression   . Valvular heart disease 09/04/2012  . Left leg pain 10/02/2012  . UTI (urinary tract infection) 12/30/2012    Past Surgical History  Procedure Laterality Date  . Appendectomy    . Cholecystectomy    . Kidney stones    . Tonsillectomy    . Abdominal hysterectomy    . Hand surgery    . Feet surgery    . Shoulder arthroscopy    . Back surgery    . Tonsillectomy    . Eye surgery      cataract removed and eye lids lifted  . Fracture surgery      bilateral arms  . Tubal ligation    . Colonoscopy w/ polypectomy      Family History  Problem Relation Age of Onset  . Heart disease Mother   . Heart disease Father   . Heart disease Other   . Birth defects Other     History   Social History  . Marital Status: Widowed    Spouse Name: N/A    Number  of Children: N/A  . Years of Education: N/A   Occupational History  . Not on file.   Social History Main Topics  . Smoking status: Former Smoker -- 0.20 packs/day for 1 years    Types: Cigarettes    Quit date: 06/02/1941  . Smokeless tobacco: Never Used  . Alcohol Use: No  . Drug Use: No  . Sexual Activity: Not Currently    Birth Control/ Protection: Abstinence   Other Topics Concern  . Not on file   Social History Narrative   Still lives at home and ambulatory daily. Doesn't use cane or walker, but has cane at home. Remote smoking history.     Current Outpatient Prescriptions on File Prior to Visit  Medication Sig Dispense Refill  . levothyroxine (SYNTHROID, LEVOTHROID) 50 MCG tablet Take 1 tablet (50 mcg total) by mouth daily.  30 tablet  3  . lisinopril (PRINIVIL,ZESTRIL) 10 MG tablet Take 1 tablet (10 mg total) by mouth daily.  30 tablet  2  . LORazepam (ATIVAN) 1 MG tablet Take 1 tablet (  1 mg total) by mouth at bedtime as needed for anxiety (for anxiety).  30 tablet  0  . magnesium oxide (MAG-OX) 400 (241.3 MG) MG tablet Take 1 tablet (400 mg total) by mouth daily.  30 tablet  0   No current facility-administered medications on file prior to visit.    Allergies  Allergen Reactions  . Codeine Rash  . Hydrocodone Rash    Review of Systems  Review of Systems  Constitutional: Negative for fever and malaise/fatigue.  HENT: Negative for congestion.   Eyes: Negative for discharge.  Respiratory: Negative for shortness of breath.   Cardiovascular: Negative for chest pain, palpitations and leg swelling.  Gastrointestinal: Negative for nausea, abdominal pain and diarrhea.  Genitourinary: Negative for dysuria.  Musculoskeletal: Negative for falls.  Skin: Negative for rash.  Neurological: Negative for loss of consciousness and headaches.  Endo/Heme/Allergies: Negative for polydipsia.  Psychiatric/Behavioral: Negative for depression and suicidal ideas. The patient is not  nervous/anxious and does not have insomnia.     Objective  BP 138/70  Pulse 68  Temp(Src) 97.6 F (36.4 C) (Oral)  Ht 5\' 5"  (1.651 m)  Wt 181 lb (82.101 kg)  BMI 30.12 kg/m2  SpO2 95%  Physical Exam  Physical Exam  Constitutional: She is oriented to person, place, and time and well-developed, well-nourished, and in no distress. No distress.  HENT:  Head: Normocephalic and atraumatic.  Eyes: Conjunctivae are normal.  Neck: Neck supple. No thyromegaly present.  Cardiovascular: Normal rate, regular rhythm and normal heart sounds.   Pulmonary/Chest: Effort normal and breath sounds normal. She has no wheezes.  Abdominal: She exhibits no distension and no mass.  Musculoskeletal: She exhibits no edema.  Lymphadenopathy:    She has no cervical adenopathy.  Neurological: She is alert and oriented to person, place, and time.  Skin: Skin is warm and dry. No rash noted. She is not diaphoretic.  Psychiatric: Memory, affect and judgment normal.    Lab Results  Component Value Date   TSH 2.502 12/19/2012   Lab Results  Component Value Date   WBC 8.6 12/18/2012   HGB 12.5 12/18/2012   HCT 36.1 12/18/2012   MCV 86.0 12/18/2012   PLT 237 12/18/2012   Lab Results  Component Value Date   CREATININE 0.95 12/28/2012   BUN 16 12/28/2012   NA 140 12/28/2012   K 5.0 12/28/2012   CL 105 12/28/2012   CO2 27 12/28/2012   Lab Results  Component Value Date   ALT 17 12/16/2012   AST 17 12/16/2012   ALKPHOS 84 12/16/2012   BILITOT 0.3 12/16/2012   Lab Results  Component Value Date   CHOL 226* 02/25/2011   Lab Results  Component Value Date   HDL 41 02/25/2011   Lab Results  Component Value Date   LDLCALC 151* 02/25/2011   Lab Results  Component Value Date   TRIG 168* 02/25/2011   Lab Results  Component Value Date   CHOLHDL 5.5 02/25/2011     Assessment & Plan  Hypertension Well controlled, no changes.   Hypothyroidism Well treated, no changes.   UTI (urinary tract  infection) Resolved, urine culture negative.  Vertigo, peripheral recnetly in ED with vertigo, symptoms are improved at this time.

## 2013-02-11 ENCOUNTER — Telehealth: Payer: Self-pay

## 2013-02-11 NOTE — Telephone Encounter (Signed)
Left a message for pt to return my call. We received paperwork from [facility] and per MD if patient wants the paperwork filled out then she needs to come in

## 2013-02-16 NOTE — Telephone Encounter (Signed)
Carollee Herter left a message for patient to return our call yesterday

## 2013-02-24 ENCOUNTER — Ambulatory Visit (HOSPITAL_BASED_OUTPATIENT_CLINIC_OR_DEPARTMENT_OTHER)
Admission: RE | Admit: 2013-02-24 | Discharge: 2013-02-24 | Disposition: A | Discharge status: Home / Self Care | Payer: Medicare Other | Source: Ambulatory Visit | Attending: Family Medicine | Admitting: Family Medicine

## 2013-02-24 ENCOUNTER — Ambulatory Visit (INDEPENDENT_AMBULATORY_CARE_PROVIDER_SITE_OTHER): Payer: Medicare Other | Admitting: Family Medicine

## 2013-02-24 ENCOUNTER — Encounter: Payer: Self-pay | Admitting: Family Medicine

## 2013-02-24 VITALS — BP 116/62 | HR 51 | Temp 97.9°F | Ht 65.0 in | Wt 183.0 lb

## 2013-02-24 DIAGNOSIS — M79662 Pain in left lower leg: Secondary | ICD-10-CM

## 2013-02-24 DIAGNOSIS — M79609 Pain in unspecified limb: Secondary | ICD-10-CM | POA: Insufficient documentation

## 2013-02-24 DIAGNOSIS — I10 Essential (primary) hypertension: Secondary | ICD-10-CM

## 2013-02-24 DIAGNOSIS — F0390 Unspecified dementia without behavioral disturbance: Secondary | ICD-10-CM

## 2013-02-24 DIAGNOSIS — M79605 Pain in left leg: Secondary | ICD-10-CM

## 2013-02-24 DIAGNOSIS — N390 Urinary tract infection, site not specified: Secondary | ICD-10-CM

## 2013-02-24 DIAGNOSIS — Z23 Encounter for immunization: Secondary | ICD-10-CM

## 2013-02-24 NOTE — Progress Notes (Signed)
Patient ID: [name], female   DOB: March 07, 1921, 79 y.o.   MRN: [id number] [name] [id number] [birth year] 02/24/2013      Progress Note-Follow Up  Subjective  Chief Complaint  Chief Complaint  Patient presents with  . Follow-up    4 week  . Injections    flu    HPI  Patient is a 79 year old Caucasian female who is in today for followup. Her last visit she was having great deal of anxiety regarding her relationship with her son. We started Celexa but unfortunately she had significant itching with this she stopped the medication and itching stopped. She then restarted the medication and the itching returned so she is now off again and feeling well.  She reports the anxiety is tolerable and does not want to start a new medication. Agrees to flu shot today. Is complaining of increased pain and swelling in her left calf for about 4 days now. Day before this occurred she tearful and it hasn't, to the calf. No warmth or redness. No chest pain, palpitations or shortness of breath. She's had a for falls and she offers no other complaints. No GI or GU concerns  Past Medical History  Diagnosis Date  . Arthritis   . Chicken pox   . Chronic kidney disease   . Colon polyps   . Hypertension   . Dementia   . Angina   . Dysrhythmia   . Heart murmur   . Shortness of breath   . Recurrent upper respiratory infection (URI)   . Pneumonia   . Hypothyroidism   . Anemia   . Headache(784.0)   . Anxiety   . Depression   . Valvular heart disease 09/04/2012  . Left leg pain 10/02/2012  . UTI (urinary tract infection) 12/30/2012    Past Surgical History  Procedure Laterality Date  . Appendectomy    . Cholecystectomy    . Kidney stones    . Tonsillectomy    . Abdominal hysterectomy    . Hand surgery    . Feet surgery    . Shoulder arthroscopy    . Back surgery    . Tonsillectomy    . Eye surgery      cataract removed and eye lids lifted  . Fracture surgery      bilateral arms  . Tubal  ligation    . Colonoscopy w/ polypectomy      Family History  Problem Relation Age of Onset  . Heart disease Mother   . Heart disease Father   . Heart disease Other   . Birth defects Other     History   Social History  . Marital Status: Widowed    Spouse Name: N/A    Number of Children: N/A  . Years of Education: N/A   Occupational History  . Not on file.   Social History Main Topics  . Smoking status: Former Smoker -- 0.20 packs/day for 1 years    Types: Cigarettes    Quit date: 06/02/1941  . Smokeless tobacco: Never Used  . Alcohol Use: No  . Drug Use: No  . Sexual Activity: Not Currently    Birth Control/ Protection: Abstinence   Other Topics Concern  . Not on file   Social History Narrative   Still lives at home and ambulatory daily. Doesn't use cane or walker, but has cane at home. Remote smoking history.     Current Outpatient Prescriptions on File Prior to Visit  Medication Sig  Dispense Refill  . ketoconazole (NIZORAL) 2 % shampoo Apply topically 2 (two) times a week. Wash off after 10 minutes  120 mL  1  . levothyroxine (SYNTHROID, LEVOTHROID) 50 MCG tablet Take 1 tablet (50 mcg total) by mouth daily.  30 tablet  3  . LORazepam (ATIVAN) 1 MG tablet Take 1 tablet (1 mg total) by mouth at bedtime as needed for anxiety (for anxiety).  30 tablet  0   No current facility-administered medications on file prior to visit.    Allergies  Allergen Reactions  . Codeine Rash  . Hydrocodone Rash    Review of Systems  Review of Systems  Constitutional: Negative for fever and malaise/fatigue.  HENT: Negative for congestion.   Eyes: Negative for discharge.  Respiratory: Negative for shortness of breath.   Cardiovascular: Negative for chest pain, palpitations and leg swelling.  Gastrointestinal: Negative for nausea, abdominal pain and diarrhea.  Genitourinary: Negative for dysuria.  Musculoskeletal: Positive for joint pain. Negative for falls.  Skin: Negative  for rash.  Neurological: Negative for loss of consciousness and headaches.  Endo/Heme/Allergies: Negative for polydipsia.  Psychiatric/Behavioral: Negative for depression and suicidal ideas. The patient is not nervous/anxious and does not have insomnia.     Objective  BP 116/62  Pulse 51  Temp(Src) 97.9 F (36.6 C) (Oral)  Ht 5\' 5"  (1.651 m)  Wt 183 lb (83.008 kg)  BMI 30.45 kg/m2  SpO2 96%  Physical Exam  Physical Exam  Constitutional: She is oriented to person, place, and time and well-developed, well-nourished, and in no distress. No distress.  HENT:  Head: Normocephalic and atraumatic.  Eyes: Conjunctivae are normal.  Neck: Neck supple. No thyromegaly present.  Cardiovascular: Normal rate, regular rhythm and normal heart sounds.   No murmur heard. Pulmonary/Chest: Effort normal and breath sounds normal. She has no wheezes.  Abdominal: She exhibits no distension and no mass.  Musculoskeletal: She exhibits no edema.  Lymphadenopathy:    She has no cervical adenopathy.  Neurological: She is alert and oriented to person, place, and time.  Skin: Skin is warm and dry. No rash noted. She is not diaphoretic.  Left calf pain with mild swelling  Psychiatric: Memory, affect and judgment normal.    Lab Results  Component Value Date   TSH 2.502 12/19/2012   Lab Results  Component Value Date   WBC 8.6 12/18/2012   HGB 12.5 12/18/2012   HCT 36.1 12/18/2012   MCV 86.0 12/18/2012   PLT 237 12/18/2012   Lab Results  Component Value Date   CREATININE 0.95 12/28/2012   BUN 16 12/28/2012   NA 140 12/28/2012   K 5.0 12/28/2012   CL 105 12/28/2012   CO2 27 12/28/2012   Lab Results  Component Value Date   ALT 17 12/16/2012   AST 17 12/16/2012   ALKPHOS 84 12/16/2012   BILITOT 0.3 12/16/2012   Lab Results  Component Value Date   CHOL 226* 02/25/2011   Lab Results  Component Value Date   HDL 41 02/25/2011   Lab Results  Component Value Date   LDLCALC 151* 02/25/2011   Lab Results   Component Value Date   TRIG 168* 02/25/2011   Lab Results  Component Value Date   CHOLHDL 5.5 02/25/2011     Assessment & Plan  Hypertension Well controlled at current visit.  Dementia Was experiencing persistent high anxiety at her last visit due to her strained relationship with her son so we tried starting Celexa. Unfortunately she  experienced a lot of pruritus so stopped it. The itching went away so they restarted and it returned. They are now off and doing well enough now not to try another med  Left leg pain Worse this week with increased swelling. It worsened 1 day after a near fall caused some trauma. Ultrasound today negative for DVT encouraged compression hose and heat. Report worsening symptoms  UTI (urinary tract infection) Asymptomatic at this time

## 2013-02-24 NOTE — Patient Instructions (Addendum)
Restart Magnesium 200 mg, Aspercreme or Salon Pas cream  Muscle Cramps Muscle cramps are due to sudden involuntary muscle contraction. This means you have no control over the tightening of a muscle (or muscles). Often there are no obvious causes. Muscle cramps may occur with overexertion. They may also occur with chilling of the muscles. An example of a muscle chilling activity is swimming. It is uncommon for cramps to be due to a serious underlying disorder. In most cases, muscle cramps improve (or leave) within minutes. CAUSES  Some common causes are:  Injury.  Infections, especially viral.  Abnormal levels of the salts and ions in your blood (electrolytes). This could happen if you are taking water pills (diuretics).  Blood vessel disease where not enough blood is getting to the muscles (intermittent claudication). Some uncommon causes are:  Side effects of some medicine (such as lithium).  Alcohol abuse.  Diseases where there is soreness (inflammation) of the muscular system. HOME CARE INSTRUCTIONS   It may be helpful to massage, stretch, and relax the affected muscle.  Taking a dose of over-the-counter diphenhydramine is helpful for night leg cramps. SEEK MEDICAL CARE IF:  Cramps are frequent and not relieved with medicine. MAKE SURE YOU:   Understand these instructions.  Will watch your condition.  Will get help right away if you are not doing well or get worse. Document Released: 11/08/2001 Document Revised: 08/11/2011 Document Reviewed: 05/10/2008 [facility] Patient Information 2014 [place], [place].

## 2013-02-25 NOTE — Telephone Encounter (Signed)
Patient's daughter in law informed.

## 2013-02-25 NOTE — Progress Notes (Signed)
Quick Note:  Patients daughter in law Informed and voiced understanding ______

## 2013-02-27 NOTE — Assessment & Plan Note (Signed)
Worse this week with increased swelling. It worsened 1 day after a near fall caused some trauma. Ultrasound today negative for DVT encouraged compression hose and heat. Report worsening symptoms

## 2013-02-27 NOTE — Assessment & Plan Note (Signed)
Was experiencing persistent high anxiety at her last visit due to her strained relationship with her son so we tried starting Celexa. Unfortunately she experienced a lot of pruritus so stopped it. The itching went away so they restarted and it returned. They are now off and doing well enough now not to try another med

## 2013-02-27 NOTE — Assessment & Plan Note (Signed)
Well controlled at current visit. 

## 2013-02-27 NOTE — Assessment & Plan Note (Signed)
Asymptomatic at this time 

## 2013-03-20 NOTE — Progress Notes (Signed)
Patient ID: [name], female   DOB: 1920-12-10, 79 y.o.   MRN: [id number] Correction: Patient endorses persistent low back pain as well as b/l posterior hip pain present

## 2013-03-23 ENCOUNTER — Other Ambulatory Visit: Payer: Self-pay | Admitting: Family Medicine

## 2013-03-23 NOTE — Telephone Encounter (Signed)
Lisinopril refill requested; medication not on current Rx list in EMR, shows d/c on 09.25.14/SLS Please Advise.

## 2013-03-23 NOTE — Telephone Encounter (Signed)
I thought the patient had stopped the Lisinopril just check with patient to confirm she does not need to have [place] refill this

## 2013-03-24 NOTE — Telephone Encounter (Signed)
Done

## 2013-03-25 NOTE — Telephone Encounter (Signed)
I spoke to [name] (pts daughter in law) and was informed that pt is NO longer taking the Lisinopril.

## 2013-03-25 NOTE — Telephone Encounter (Signed)
Spoke directly with the patient and she confirmed that Dr. Abner Greenspan discontinued [name] her to stop taking"] Lisinopril when in office for visit and she is not taking; explained could be on refill cycle with pharmacy and we would let them know. Patient understood/SLS

## 2013-03-29 ENCOUNTER — Emergency Department (HOSPITAL_COMMUNITY)
Admission: EM | Admit: 2013-03-29 | Discharge: 2013-03-29 | Disposition: A | Discharge status: Home / Self Care | Payer: Medicare Other | Attending: Emergency Medicine | Admitting: Emergency Medicine

## 2013-03-29 ENCOUNTER — Emergency Department (HOSPITAL_COMMUNITY): Payer: Medicare Other

## 2013-03-29 ENCOUNTER — Encounter (HOSPITAL_COMMUNITY): Payer: Self-pay | Admitting: Emergency Medicine

## 2013-03-29 DIAGNOSIS — R109 Unspecified abdominal pain: Secondary | ICD-10-CM

## 2013-03-29 DIAGNOSIS — Z860100 Personal history of colon polyps, unspecified: Secondary | ICD-10-CM | POA: Insufficient documentation

## 2013-03-29 DIAGNOSIS — Z862 Personal history of diseases of the blood and blood-forming organs and certain disorders involving the immune mechanism: Secondary | ICD-10-CM | POA: Insufficient documentation

## 2013-03-29 DIAGNOSIS — R1084 Generalized abdominal pain: Secondary | ICD-10-CM | POA: Insufficient documentation

## 2013-03-29 DIAGNOSIS — Z8739 Personal history of other diseases of the musculoskeletal system and connective tissue: Secondary | ICD-10-CM | POA: Insufficient documentation

## 2013-03-29 DIAGNOSIS — I209 Angina pectoris, unspecified: Secondary | ICD-10-CM | POA: Insufficient documentation

## 2013-03-29 DIAGNOSIS — R110 Nausea: Secondary | ICD-10-CM | POA: Insufficient documentation

## 2013-03-29 DIAGNOSIS — M79609 Pain in unspecified limb: Secondary | ICD-10-CM | POA: Insufficient documentation

## 2013-03-29 DIAGNOSIS — Z79899 Other long term (current) drug therapy: Secondary | ICD-10-CM | POA: Insufficient documentation

## 2013-03-29 DIAGNOSIS — R1033 Periumbilical pain: Secondary | ICD-10-CM | POA: Insufficient documentation

## 2013-03-29 DIAGNOSIS — E039 Hypothyroidism, unspecified: Secondary | ICD-10-CM | POA: Insufficient documentation

## 2013-03-29 DIAGNOSIS — F411 Generalized anxiety disorder: Secondary | ICD-10-CM | POA: Insufficient documentation

## 2013-03-29 DIAGNOSIS — Z87891 Personal history of nicotine dependence: Secondary | ICD-10-CM | POA: Insufficient documentation

## 2013-03-29 DIAGNOSIS — N390 Urinary tract infection, site not specified: Secondary | ICD-10-CM | POA: Insufficient documentation

## 2013-03-29 DIAGNOSIS — Z8701 Personal history of pneumonia (recurrent): Secondary | ICD-10-CM | POA: Insufficient documentation

## 2013-03-29 DIAGNOSIS — Z9089 Acquired absence of other organs: Secondary | ICD-10-CM | POA: Insufficient documentation

## 2013-03-29 DIAGNOSIS — I129 Hypertensive chronic kidney disease with stage 1 through stage 4 chronic kidney disease, or unspecified chronic kidney disease: Secondary | ICD-10-CM | POA: Insufficient documentation

## 2013-03-29 DIAGNOSIS — N189 Chronic kidney disease, unspecified: Secondary | ICD-10-CM | POA: Insufficient documentation

## 2013-03-29 DIAGNOSIS — F0390 Unspecified dementia without behavioral disturbance: Secondary | ICD-10-CM | POA: Insufficient documentation

## 2013-03-29 DIAGNOSIS — Z8619 Personal history of other infectious and parasitic diseases: Secondary | ICD-10-CM | POA: Insufficient documentation

## 2013-03-29 DIAGNOSIS — R0789 Other chest pain: Secondary | ICD-10-CM | POA: Insufficient documentation

## 2013-03-29 DIAGNOSIS — R011 Cardiac murmur, unspecified: Secondary | ICD-10-CM | POA: Insufficient documentation

## 2013-03-29 LAB — COMPREHENSIVE METABOLIC PANEL
Albumin: 3.6 g/dL (ref 3.5–5.2)
Alkaline Phosphatase: 77 U/L (ref 39–117)
BUN: 14 mg/dL (ref 6–23)
Calcium: 9 mg/dL (ref 8.4–10.5)
Creatinine, Ser: 0.87 mg/dL (ref 0.50–1.10)
GFR calc Af Amer: 66 mL/min — ABNORMAL LOW (ref >90)
Glucose, Bld: 121 mg/dL — ABNORMAL HIGH (ref 70–99)
Total Protein: 6.9 g/dL (ref 6.0–8.3)

## 2013-03-29 LAB — CBC WITH DIFFERENTIAL/PLATELET
Basophils Relative: 0 % (ref 0–1)
Eosinophils Absolute: 0.1 10*3/uL (ref 0.0–0.7)
Eosinophils Relative: 1 % (ref 0–5)
HCT: 33.1 % — ABNORMAL LOW (ref 36.0–46.0)
Hemoglobin: 11.6 g/dL — ABNORMAL LOW (ref 12.0–15.0)
Lymphs Abs: 1.1 10*3/uL (ref 0.7–4.0)
MCH: 30.4 pg (ref 26.0–34.0)
MCHC: 35 g/dL (ref 30.0–36.0)
MCV: 86.9 fL (ref 78.0–100.0)
Monocytes Absolute: 0.6 10*3/uL (ref 0.1–1.0)
Monocytes Relative: 6 % (ref 3–12)
Neutrophils Relative %: 83 % — ABNORMAL HIGH (ref 43–77)
RBC: 3.81 MIL/uL — ABNORMAL LOW (ref 3.87–5.11)

## 2013-03-29 LAB — URINALYSIS, ROUTINE W REFLEX MICROSCOPIC
Bilirubin Urine: NEGATIVE
Nitrite: NEGATIVE
Specific Gravity, Urine: 1.011 (ref 1.005–1.030)
Urobilinogen, UA: 0.2 mg/dL (ref 0.0–1.0)
pH: 6 (ref 5.0–8.0)

## 2013-03-29 LAB — LIPASE, BLOOD: Lipase: 37 U/L (ref 11–59)

## 2013-03-29 LAB — POCT I-STAT TROPONIN I

## 2013-03-29 LAB — URINE MICROSCOPIC-ADD ON

## 2013-03-29 MED ORDER — CEPHALEXIN 250 MG PO CAPS
500.0000 mg | ORAL_CAPSULE | Freq: Two times a day (BID) | ORAL | Status: DC
  Administered 2013-03-29: 500 mg via ORAL
  Filled 2013-03-29: qty 2

## 2013-03-29 MED ORDER — CEPHALEXIN 500 MG PO CAPS: 500.0000 mg | ORAL_CAPSULE | Freq: Two times a day (BID) | ORAL | Status: DC

## 2013-03-29 MED ORDER — IOHEXOL 300 MG/ML  SOLN
25.0000 mL | INTRAMUSCULAR | Status: AC
  Administered 2013-03-29: 25 mL via ORAL

## 2013-03-29 MED ORDER — IOHEXOL 300 MG/ML  SOLN
80.0000 mL | Freq: Once | INTRAMUSCULAR | Status: AC | PRN
  Administered 2013-03-29: 80 mL via INTRAVENOUS

## 2013-03-29 NOTE — ED Notes (Signed)
Per EMS- patient complaint of L abdominal pain but describes it as chest. When asked where she points below her left breast to LUQ abdomen. Vomiting episode X 1 prior to EMS arrival. Relief with 4 of zofran.

## 2013-03-29 NOTE — ED Provider Notes (Addendum)
CSN: [id number]     Arrival date & time 03/29/13  0419 History   First MD Initiated Contact with Patient 03/29/13 0434     Chief Complaint  Patient presents with  . Chest Pain  . Abdominal Pain   (Consider location/radiation/quality/duration/timing/severity/associated sxs/prior Treatment) HPI 79 year old female presents to emergency department with complaint of left-sided abdominal pain and left lower chest pain.  She reports pain started this morning waking her up after she coughed.  Her friend reports, that she's been coughing more recently.  No reported fever, no sputum.  No pain with urination, but has chronic frequency over many months.  Patient also complaining of bilateral lower extremity pain, left greater than right, which is also chronic.  Patient has had nausea, received Zofran from EMS.  Patient is a poor historian due to dementia  Past Medical History  Diagnosis Date  . Arthritis   . Chicken pox   . Chronic kidney disease   . Colon polyps   . Hypertension   . Dementia   . Angina   . Dysrhythmia   . Heart murmur   . Shortness of breath   . Recurrent upper respiratory infection (URI)   . Pneumonia   . Hypothyroidism   . Anemia   . Headache(784.0)   . Anxiety   . Depression   . Valvular heart disease 09/04/2012  . Left leg pain 10/02/2012  . UTI (urinary tract infection) 12/30/2012   Past Surgical History  Procedure Laterality Date  . Appendectomy    . Cholecystectomy    . Kidney stones    . Tonsillectomy    . Abdominal hysterectomy    . Hand surgery    . Feet surgery    . Shoulder arthroscopy    . Back surgery    . Tonsillectomy    . Eye surgery      cataract removed and eye lids lifted  . Fracture surgery      bilateral arms  . Tubal ligation    . Colonoscopy w/ polypectomy     Family History  Problem Relation Age of Onset  . Heart disease Mother   . Heart disease Father   . Heart disease Other   . Birth defects Other    History  Substance Use  Topics  . Smoking status: Former Smoker -- 0.20 packs/day for 1 years    Types: Cigarettes    Quit date: 06/02/1941  . Smokeless tobacco: Never Used  . Alcohol Use: No   OB History   Grav Para Term Preterm Abortions TAB SAB Ect Mult Living                 Review of Systems  Unable to perform ROS: Dementia    Allergies  Codeine and Hydrocodone  Home Medications   Current Outpatient Rx  Name  Route  Sig  Dispense  Refill  . levothyroxine (SYNTHROID, LEVOTHROID) 50 MCG tablet   Oral   Take 1 tablet (50 mcg total) by mouth daily.   30 tablet   3   . LORazepam (ATIVAN) 1 MG tablet   Oral   Take 1 tablet (1 mg total) by mouth at bedtime as needed for anxiety (for anxiety).   30 tablet   0   . Magnesium 250 MG TABS   Oral   Take 1 tablet by mouth daily.          BP 146/56  Pulse 71  Temp(Src) 99.1 F (37.3 C) (Oral)  Resp  22  SpO2 96% Physical Exam  Nursing note and vitals reviewed. Constitutional: She appears well-developed and well-nourished. No distress.  HENT:  Head: Normocephalic and atraumatic.  Nose: Nose normal.  Mouth/Throat: Oropharynx is clear and moist.  Eyes: Conjunctivae and EOM are normal. Pupils are equal, round, and reactive to light.  Neck: Normal range of motion. Neck supple. No JVD present. No tracheal deviation present. No thyromegaly present.  Cardiovascular: Normal rate, regular rhythm, normal heart sounds and intact distal pulses.  Exam reveals no gallop and no friction rub.   No murmur heard. Pulmonary/Chest: Effort normal and breath sounds normal. No stridor. No respiratory distress. She has no wheezes. She has no rales. She exhibits no tenderness.  Abdominal: Soft. Bowel sounds are normal. She exhibits no distension and no mass. There is tenderness (diffuse tenderness, worse in left upper quadrant, and periumbilical). There is no rebound and no guarding.  Musculoskeletal: Normal range of motion. She exhibits no edema and no tenderness.   Lymphadenopathy:    She has no cervical adenopathy.  Neurological: She is alert. She exhibits normal muscle tone. Coordination normal.  Oriented to self, and that she is in the hospital  Skin: Skin is warm and dry. No rash noted. No erythema. No pallor.  Psychiatric: She has a normal mood and affect. Her behavior is normal. Judgment and thought content normal.    ED Course  Procedures (including critical care time) Labs Review Labs Reviewed  CBC WITH DIFFERENTIAL - Abnormal; Notable for the following:    WBC 11.1 (*)    RBC 3.81 (*)    Hemoglobin 11.6 (*)    HCT 33.1 (*)    Neutrophils Relative % 83 (*)    Neutro Abs 9.2 (*)    Lymphocytes Relative 10 (*)    All other components within normal limits  COMPREHENSIVE METABOLIC PANEL - Abnormal; Notable for the following:    Glucose, Bld 121 (*)    GFR calc non Af Amer 57 (*)    GFR calc Af Amer 66 (*)    All other components within normal limits  LIPASE, BLOOD  URINALYSIS, ROUTINE W REFLEX MICROSCOPIC  POCT I-STAT TROPONIN I   Imaging Review Dg Chest 2 View  03/29/2013   CLINICAL DATA:  Chest pain.  EXAM: CHEST  2 VIEW  COMPARISON:  08/15/2012  FINDINGS: Chronic cardiomegaly. Convexity of the lower mediastinal contours likely relates to leftward rotation. Hyperinflated lungs. No edema, infiltrate, effusion, or pneumothorax.  IMPRESSION: No active cardiopulmonary disease.   Electronically Signed   By: Tiburcio Pea M.D.   On: 03/29/2013 06:45   Ct Abdomen Pelvis W Contrast  03/29/2013   CLINICAL DATA:  Sudden onset of left upper quadrant abdominal pain with nausea and vomiting. History of appendectomy, cholecystectomy and hysterectomy.  EXAM: CT ABDOMEN AND PELVIS WITH CONTRAST  TECHNIQUE: Multidetector CT imaging of the abdomen and pelvis was performed using the standard protocol following bolus administration of intravenous contrast.  CONTRAST:  80mL OMNIPAQUE IOHEXOL 300 MG/ML  SOLN  COMPARISON:  Abdominal pelvic CT  10/05/2010.  FINDINGS: The visualized lung bases are clear. There is no significant pleural or pericardial effusion.  There is stable mild biliary dilatation status post cholecystectomy. No calcified intraductal calculus is demonstrated. The liver and spleen appear normal. The pancreas is atrophied with focal abnormality. There is no adrenal mass.  Again demonstrated are nonobstructing calculi in the lower pole calyces of the left kidney. Mild dilatation of both renal pelves is unchanged. On the  early images, there is relatively increased density within both renal pelves. The delayed post contrast images demonstrate no significant delay in contrast excretion. There is no evidence of ureteral or bladder calculus. There is no evidence of renal mass. Small renal cysts are present on the right. There is bilateral renal cortical thinning.  The stomach and small bowel appear normal. There is a lipoma of the ileocecal valve. No inflammatory changes are identified. There is no evidence of pelvic mass or lymphadenopathy status post hysterectomy. The bladder appears unremarkable.  Lumbar spine degenerative changes are stable. No acute or worrisome osseous findings are seen.  IMPRESSION: 1. No definite acute abdominal pelvic findings demonstrated. No explanation for left upper quadrant pain. 2. Stable nonobstructing calculi in the lower pole calyces of the left kidney. No recurrent ureteral/bladder calculus or obstruction. There is increased density in both renal pelves on the early images which could be technical or related to hematuria/urinary tract infection. Correlation with urine analysis recommended. 3. Stable mild biliary dilatation status post cholecystectomy.   Electronically Signed   By: Roxy Horseman M.D.   On: 03/29/2013 09:08    EKG Interpretation     Ventricular Rate:  71 PR Interval:  214 QRS Duration: 154 QT Interval:  428 QTC Calculation: 465 R Axis:   -72 Text Interpretation:  Sinus rhythm  Borderline prolonged PR interval RBBB and LAFB Inferior infarct, old No significant change since last tracing            MDM   1. Abdominal pain   2. UTI (urinary tract infection)    79 year old female with chest and abdominal pain.  Significant pain on exam.  Plan for CT scan.  EKG without either ischemia.  Care passed to Dr. Hyacinth Meeker.  Awaiting CT scan results.    Olivia Mackie, MD 03/29/13 [id number]  Olivia Mackie, MD 03/29/13 [phone]

## 2013-03-29 NOTE — ED Notes (Signed)
Pt brought contrast by CT, drinking at this time.

## 2013-03-29 NOTE — ED Notes (Signed)
Pt reports of L sided pain "my chest or belly." Points to LUQ abdomen, reports discomfort upon palpation to LUQ, guarding present. States it hurts a lot when she moves. Vomited once prior to EMS arrival, nausea relieved with zofran given by EMS.

## 2013-03-31 LAB — URINE CULTURE: Colony Count: >=100000

## 2013-04-05 ENCOUNTER — Ambulatory Visit (INDEPENDENT_AMBULATORY_CARE_PROVIDER_SITE_OTHER): Payer: Medicare Other | Admitting: Family Medicine

## 2013-04-05 ENCOUNTER — Encounter: Payer: Self-pay | Admitting: Family Medicine

## 2013-04-05 VITALS — BP 122/70 | HR 52 | Temp 98.4°F | Ht 65.0 in | Wt 182.0 lb

## 2013-04-05 DIAGNOSIS — J209 Acute bronchitis, unspecified: Secondary | ICD-10-CM

## 2013-04-05 DIAGNOSIS — L989 Disorder of the skin and subcutaneous tissue, unspecified: Secondary | ICD-10-CM

## 2013-04-05 DIAGNOSIS — I10 Essential (primary) hypertension: Secondary | ICD-10-CM

## 2013-04-05 DIAGNOSIS — T148XXA Other injury of unspecified body region, initial encounter: Secondary | ICD-10-CM

## 2013-04-05 DIAGNOSIS — E785 Hyperlipidemia, unspecified: Secondary | ICD-10-CM

## 2013-04-05 DIAGNOSIS — J441 Chronic obstructive pulmonary disease with (acute) exacerbation: Secondary | ICD-10-CM

## 2013-04-05 DIAGNOSIS — IMO0002 Reserved for concepts with insufficient information to code with codable children: Secondary | ICD-10-CM

## 2013-04-05 DIAGNOSIS — E119 Type 2 diabetes mellitus without complications: Secondary | ICD-10-CM

## 2013-04-05 LAB — CBC
HCT: 35.6 % — ABNORMAL LOW (ref 36.0–46.0)
Hemoglobin: 12.1 g/dL (ref 12.0–15.0)
MCHC: 34 g/dL (ref 30.0–36.0)
MCV: 86.6 fL (ref 78.0–100.0)
RBC: 4.11 MIL/uL (ref 3.87–5.11)
RDW: 13.9 % (ref 11.5–15.5)
WBC: 9.3 10*3/uL (ref 4.0–10.5)

## 2013-04-05 LAB — HEMOGLOBIN A1C: Mean Plasma Glucose: 120 mg/dL — ABNORMAL HIGH (ref <117)

## 2013-04-05 MED ORDER — MUPIROCIN 2 % EX OINT: 1.0000 "application " | TOPICAL_OINTMENT | Freq: Two times a day (BID) | CUTANEOUS | Status: DC

## 2013-04-05 MED ORDER — ALBUTEROL SULFATE HFA 108 (90 BASE) MCG/ACT IN AERS: 2.0000 | INHALATION_SPRAY | Freq: Four times a day (QID) | RESPIRATORY_TRACT | Status: DC | PRN

## 2013-04-05 MED ORDER — BENZONATATE 100 MG PO CAPS: 100.0000 mg | ORAL_CAPSULE | Freq: Three times a day (TID) | ORAL | Status: DC | PRN

## 2013-04-05 NOTE — Patient Instructions (Signed)

## 2013-04-06 ENCOUNTER — Telehealth: Payer: Self-pay

## 2013-04-06 LAB — RENAL FUNCTION PANEL
CO2: 27 mEq/L (ref 19–32)
Chloride: 106 mEq/L (ref 96–112)
Creat: 0.96 mg/dL (ref 0.50–1.10)
Phosphorus: 3.2 mg/dL (ref 2.3–4.6)
Potassium: 4.3 mEq/L (ref 3.5–5.3)
Sodium: 142 mEq/L (ref 135–145)

## 2013-04-06 LAB — LIPID PANEL
Cholesterol: 166 mg/dL (ref 0–200)
HDL: 35 mg/dL — ABNORMAL LOW (ref >39)
Total CHOL/HDL Ratio: 4.7 Ratio
Triglycerides: 189 mg/dL — ABNORMAL HIGH (ref <150)
VLDL: 38 mg/dL (ref 0–40)

## 2013-04-06 LAB — HEPATIC FUNCTION PANEL
Albumin: 3.8 g/dL (ref 3.5–5.2)
Alkaline Phosphatase: 62 U/L (ref 39–117)
Total Bilirubin: 0.5 mg/dL (ref 0.3–1.2)
Total Protein: 6.6 g/dL (ref 6.0–8.3)

## 2013-04-06 LAB — TSH: TSH: 2.315 u[IU]/mL (ref 0.350–4.500)

## 2013-04-06 NOTE — Telephone Encounter (Signed)
Message copied by Eulis Manly on Wed Apr 06, 2013 11:49 AM ------      Message from: Danise Edge A      Created: Wed Apr 06, 2013 11:15 AM       Notify labs look good. No acute concerns ------

## 2013-04-06 NOTE — Telephone Encounter (Signed)
Patient informed of results.  

## 2013-04-10 ENCOUNTER — Encounter: Payer: Self-pay | Admitting: Family Medicine

## 2013-04-10 DIAGNOSIS — J209 Acute bronchitis, unspecified: Secondary | ICD-10-CM

## 2013-04-10 HISTORY — DX: Acute bronchitis, unspecified: J20.9

## 2013-04-10 NOTE — Progress Notes (Signed)
Patient ID: [name], female   DOB: 08-29-1920, 79 y.o.   MRN: [id number] KEALOHILANI MAIORINO [id number] [birth year] 04/10/2013      Progress Note-Follow Up  Subjective  Chief Complaint  Chief Complaint  Patient presents with  . Follow-up    6 week    HPI  Patient is a 79 year old female  In today for follow up. Is still coughing although it has improved. No cp/palp/sob. No fevers/chills/ha/gi or gu concerns. Stomach discomfort and reflux is better.   Past Medical History  Diagnosis Date  . Arthritis   . Chicken pox   . Chronic kidney disease   . Colon polyps   . Hypertension   . Dementia   . Angina   . Dysrhythmia   . Heart murmur   . Shortness of breath   . Recurrent upper respiratory infection (URI)   . Pneumonia   . Hypothyroidism   . Anemia   . Headache(784.0)   . Anxiety   . Depression   . Valvular heart disease 09/04/2012  . Left leg pain 10/02/2012  . UTI (urinary tract infection) 12/30/2012  . Acute bronchitis 04/10/2013    Past Surgical History  Procedure Laterality Date  . Appendectomy    . Cholecystectomy    . Kidney stones    . Tonsillectomy    . Abdominal hysterectomy    . Hand surgery    . Feet surgery    . Shoulder arthroscopy    . Back surgery    . Tonsillectomy    . Eye surgery      cataract removed and eye lids lifted  . Fracture surgery      bilateral arms  . Tubal ligation    . Colonoscopy w/ polypectomy      Family History  Problem Relation Age of Onset  . Heart disease Mother   . Heart disease Father   . Heart disease Other   . Birth defects Other     History   Social History  . Marital Status: Widowed    Spouse Name: N/A    Number of Children: N/A  . Years of Education: N/A   Occupational History  . Not on file.   Social History Main Topics  . Smoking status: Former Smoker -- 0.20 packs/day for 1 years    Types: Cigarettes    Quit date: 06/02/1941  . Smokeless tobacco: Never Used  . Alcohol Use: No  . Drug Use: No   . Sexual Activity: Not Currently    Birth Control/ Protection: Abstinence   Other Topics Concern  . Not on file   Social History Narrative   Still lives at home and ambulatory daily. Doesn't use cane or walker, but has cane at home. Remote smoking history.     Current Outpatient Prescriptions on File Prior to Visit  Medication Sig Dispense Refill  . levothyroxine (SYNTHROID, LEVOTHROID) 50 MCG tablet Take 1 tablet (50 mcg total) by mouth daily.  30 tablet  3  . LORazepam (ATIVAN) 1 MG tablet Take 1 tablet (1 mg total) by mouth at bedtime as needed for anxiety (for anxiety).  30 tablet  0  . Magnesium 250 MG TABS Take 1 tablet by mouth daily.       No current facility-administered medications on file prior to visit.    Allergies  Allergen Reactions  . Codeine Rash  . Hydrocodone Rash    Review of Systems  Review of Systems  Constitutional: Negative for fever  and malaise/fatigue.  HENT: Positive for congestion.   Eyes: Negative for discharge.  Respiratory: Positive for cough and sputum production. Negative for shortness of breath.   Cardiovascular: Negative for chest pain, palpitations and leg swelling.  Gastrointestinal: Negative for nausea, abdominal pain and diarrhea.  Genitourinary: Negative for dysuria.  Musculoskeletal: Negative for falls.  Skin: Negative for rash.  Neurological: Negative for loss of consciousness and headaches.  Endo/Heme/Allergies: Negative for polydipsia.  Psychiatric/Behavioral: Negative for depression and suicidal ideas. The patient is not nervous/anxious and does not have insomnia.     Objective  BP 122/70  Pulse 52  Temp(Src) 98.4 F (36.9 C) (Oral)  Ht 5\' 5"  (1.651 m)  Wt 182 lb 0.6 oz (82.573 kg)  BMI 30.29 kg/m2  SpO2 96%  Physical Exam  Physical Exam  Constitutional: She is oriented to person, place, and time and well-developed, well-nourished, and in no distress. No distress.  HENT:  Head: Normocephalic and atraumatic.   Eyes: Conjunctivae are normal.  Neck: Neck supple. No thyromegaly present.  Cardiovascular: Normal rate, regular rhythm and normal heart sounds.   No murmur heard. Pulmonary/Chest: Effort normal. She has wheezes.  Abdominal: She exhibits no distension and no mass.  Musculoskeletal: She exhibits no edema.  Lymphadenopathy:    She has no cervical adenopathy.  Neurological: She is alert and oriented to person, place, and time.  Skin: Skin is warm and dry. No rash noted. She is not diaphoretic.  Psychiatric: Memory, affect and judgment normal.    Lab Results  Component Value Date   TSH 2.315 04/05/2013   Lab Results  Component Value Date   WBC 9.3 04/05/2013   HGB 12.1 04/05/2013   HCT 35.6* 04/05/2013   MCV 86.6 04/05/2013   PLT 293 04/05/2013   Lab Results  Component Value Date   CREATININE 0.96 04/05/2013   BUN 12 04/05/2013   NA 142 04/05/2013   K 4.3 04/05/2013   CL 106 04/05/2013   CO2 27 04/05/2013   Lab Results  Component Value Date   ALT 19 04/05/2013   AST 17 04/05/2013   ALKPHOS 62 04/05/2013   BILITOT 0.5 04/05/2013   Lab Results  Component Value Date   CHOL 166 04/05/2013   Lab Results  Component Value Date   HDL 35* 04/05/2013   Lab Results  Component Value Date   LDLCALC 93 04/05/2013   Lab Results  Component Value Date   TRIG 189* 04/05/2013   Lab Results  Component Value Date   CHOLHDL 4.7 04/05/2013     Assessment & Plan  Hypertension Well controlled, no changes.  Acute bronchitis Started on probiotics, mucinex. Likely viral, given Tessalon Perles and Albuteorl, report worsening symptoms

## 2013-04-10 NOTE — Assessment & Plan Note (Addendum)
Started on probiotics, mucinex. Likely viral, given Tessalon Perles and Albuteorl, report worsening symptoms

## 2013-04-10 NOTE — Assessment & Plan Note (Signed)
Well controlled, no changes 

## 2013-04-14 ENCOUNTER — Ambulatory Visit: Payer: Medicare Other | Admitting: Family Medicine

## 2013-04-19 ENCOUNTER — Ambulatory Visit (INDEPENDENT_AMBULATORY_CARE_PROVIDER_SITE_OTHER): Payer: Medicare Other | Admitting: Family Medicine

## 2013-04-19 ENCOUNTER — Encounter: Payer: Self-pay | Admitting: Family Medicine

## 2013-04-19 VITALS — BP 132/78 | HR 64 | Temp 98.2°F | Ht 65.0 in | Wt 181.1 lb

## 2013-04-19 DIAGNOSIS — E039 Hypothyroidism, unspecified: Secondary | ICD-10-CM

## 2013-04-19 DIAGNOSIS — N390 Urinary tract infection, site not specified: Secondary | ICD-10-CM

## 2013-04-19 DIAGNOSIS — Z23 Encounter for immunization: Secondary | ICD-10-CM

## 2013-04-19 DIAGNOSIS — R55 Syncope and collapse: Secondary | ICD-10-CM

## 2013-04-19 DIAGNOSIS — I10 Essential (primary) hypertension: Secondary | ICD-10-CM

## 2013-04-19 NOTE — Patient Instructions (Signed)
Aspercreme for pain to intact skin where ever there is pain   Hypertension As your heart beats, it forces blood through your arteries. This force is your blood pressure. If the pressure is too high, it is called hypertension (HTN) or high blood pressure. HTN is dangerous because you may have it and not know it. High blood pressure may mean that your heart has to work harder to pump blood. Your arteries may be narrow or stiff. The extra work puts you at risk for heart disease, stroke, and other problems.  Blood pressure consists of two numbers, a higher number over a lower, 110/72, for example. It is stated as "110 over 72." The ideal is below 120 for the top number (systolic) and under 80 for the bottom (diastolic). Write down your blood pressure today. You should pay close attention to your blood pressure if you have certain conditions such as:  Heart failure.  Prior heart attack.  Diabetes  Chronic kidney disease.  Prior stroke.  Multiple risk factors for heart disease. To see if you have HTN, your blood pressure should be measured while you are seated with your arm held at the level of the heart. It should be measured at least twice. A one-time elevated blood pressure reading (especially in the Emergency Department) does not mean that you need treatment. There may be conditions in which the blood pressure is different between your right and left arms. It is important to see your caregiver soon for a recheck. Most people have essential hypertension which means that there is not a specific cause. This type of high blood pressure may be lowered by changing lifestyle factors such as:  Stress.  Smoking.  Lack of exercise.  Excessive weight.  Drug/tobacco/alcohol use.  Eating less salt. Most people do not have symptoms from high blood pressure until it has caused damage to the body. Effective treatment can often prevent, delay or reduce that damage. TREATMENT  When a cause has been  identified, treatment for high blood pressure is directed at the cause. There are a large number of medications to treat HTN. These fall into several categories, and your caregiver will help you select the medicines that are best for you. Medications may have side effects. You should review side effects with your caregiver. If your blood pressure stays high after you have made lifestyle changes or started on medicines,   Your medication(s) may need to be changed.  Other problems may need to be addressed.  Be certain you understand your prescriptions, and know how and when to take your medicine.  Be sure to follow up with your caregiver within the time frame advised (usually within two weeks) to have your blood pressure rechecked and to review your medications.  If you are taking more than one medicine to lower your blood pressure, make sure you know how and at what times they should be taken. Taking two medicines at the same time can result in blood pressure that is too low. SEEK IMMEDIATE MEDICAL CARE IF:  You develop a severe headache, blurred or changing vision, or confusion.  You have unusual weakness or numbness, or a faint feeling.  You have severe chest or abdominal pain, vomiting, or breathing problems. MAKE SURE YOU:   Understand these instructions.  Will watch your condition.  Will get help right away if you are not doing well or get worse. Document Released: 05/19/2005 Document Revised: 08/11/2011 Document Reviewed: 01/07/2008 [facility] Patient Information 2014 [place], [place].

## 2013-04-19 NOTE — Progress Notes (Signed)
Pre visit review using our clinic review tool, if applicable. No additional management support is needed unless otherwise documented below in the visit note. 

## 2013-04-24 NOTE — Progress Notes (Signed)
Patient ID: [name], female   DOB: Feb 20, 1921, 79 y.o.   MRN: [id number] VERDIE WILMS [id number] 1920-11-26 04/24/2013      Progress Note-Follow Up  Subjective  Chief Complaint  Chief Complaint  Patient presents with  . Follow-up    2 week  . Injections    prevnar    HPI  She's 79 year old female who is in today with family for followup. Feeling well. No recent illness. No chest pain, palpitations, shortness of breath, GI or GU complaints. No change in medications recently. She's not had any recent syncope or falls. She is feeling well. She denies chest pain, palpitations, shortness of breath, GI or GU complaints.  Past Medical History  Diagnosis Date  . Arthritis   . Chicken pox   . Chronic kidney disease   . Colon polyps   . Hypertension   . Dementia   . Angina   . Dysrhythmia   . Heart murmur   . Shortness of breath   . Recurrent upper respiratory infection (URI)   . Pneumonia   . Hypothyroidism   . Anemia   . Headache(784.0)   . Anxiety   . Depression   . Valvular heart disease 09/04/2012  . Left leg pain 10/02/2012  . UTI (urinary tract infection) 12/30/2012  . Acute bronchitis 04/10/2013    Past Surgical History  Procedure Laterality Date  . Appendectomy    . Cholecystectomy    . Kidney stones    . Tonsillectomy    . Abdominal hysterectomy    . Hand surgery    . Feet surgery    . Shoulder arthroscopy    . Back surgery    . Tonsillectomy    . Eye surgery      cataract removed and eye lids lifted  . Fracture surgery      bilateral arms  . Tubal ligation    . Colonoscopy w/ polypectomy      Family History  Problem Relation Age of Onset  . Heart disease Mother   . Heart disease Father   . Heart disease Other   . Birth defects Other     History   Social History  . Marital Status: Widowed    Spouse Name: N/A    Number of Children: N/A  . Years of Education: N/A   Occupational History  . Not on file.   Social History Main Topics  .  Smoking status: Former Smoker -- 0.20 packs/day for 1 years    Types: Cigarettes    Quit date: 06/02/1941  . Smokeless tobacco: Never Used  . Alcohol Use: No  . Drug Use: No  . Sexual Activity: Not Currently    Birth Control/ Protection: Abstinence   Other Topics Concern  . Not on file   Social History Narrative   Still lives at home and ambulatory daily. Doesn't use cane or walker, but has cane at home. Remote smoking history.     Current Outpatient Prescriptions on File Prior to Visit  Medication Sig Dispense Refill  . albuterol (PROVENTIL HFA;VENTOLIN HFA) 108 (90 BASE) MCG/ACT inhaler Inhale 2 puffs into the lungs every 6 (six) hours as needed for wheezing or shortness of breath.  18 g  2  . benzonatate (TESSALON) 100 MG capsule Take 1 capsule (100 mg total) by mouth 3 (three) times daily as needed for cough.  40 capsule  1  . citalopram (CELEXA) 10 MG tablet       . levothyroxine (SYNTHROID,  LEVOTHROID) 50 MCG tablet Take 1 tablet (50 mcg total) by mouth daily.  30 tablet  3  . LORazepam (ATIVAN) 1 MG tablet Take 1 tablet (1 mg total) by mouth at bedtime as needed for anxiety (for anxiety).  30 tablet  0  . Magnesium 250 MG TABS Take 1 tablet by mouth daily.      . mupirocin ointment (BACTROBAN) 2 % Apply 1 application topically 2 (two) times daily. As needed for abrasion  30 g  1   No current facility-administered medications on file prior to visit.    Allergies  Allergen Reactions  . Codeine Rash  . Hydrocodone Rash    Review of Systems  Review of Systems  Constitutional: Negative for fever and malaise/fatigue.  HENT: Negative for congestion.   Eyes: Negative for discharge.  Respiratory: Negative for shortness of breath.   Cardiovascular: Negative for chest pain, palpitations and leg swelling.  Gastrointestinal: Negative for nausea, abdominal pain and diarrhea.  Genitourinary: Negative for dysuria.  Musculoskeletal: Negative for falls.  Skin: Negative for rash.   Neurological: Negative for loss of consciousness and headaches.  Endo/Heme/Allergies: Negative for polydipsia.  Psychiatric/Behavioral: Negative for depression and suicidal ideas. The patient is not nervous/anxious and does not have insomnia.     Objective  BP 132/78  Pulse 64  Temp(Src) 98.2 F (36.8 C) (Oral)  Ht 5\' 5"  (1.651 m)  Wt 181 lb 1.9 oz (82.155 kg)  BMI 30.14 kg/m2  SpO2 97%  Physical Exam  Physical Exam  Constitutional: She is oriented to person, place, and time and well-developed, well-nourished, and in no distress. No distress.  HENT:  Head: Normocephalic and atraumatic.  Eyes: Conjunctivae are normal.  Neck: Neck supple. No thyromegaly present.  Cardiovascular: Normal rate, regular rhythm and normal heart sounds.   No murmur heard. Pulmonary/Chest: Effort normal and breath sounds normal. She has no wheezes.  Abdominal: She exhibits no distension and no mass.  Musculoskeletal: She exhibits no edema.  Lymphadenopathy:    She has no cervical adenopathy.  Neurological: She is alert and oriented to person, place, and time.  Skin: Skin is warm and dry. No rash noted. She is not diaphoretic.  Psychiatric: Memory, affect and judgment normal.    Lab Results  Component Value Date   TSH 2.315 04/05/2013   Lab Results  Component Value Date   WBC 9.3 04/05/2013   HGB 12.1 04/05/2013   HCT 35.6* 04/05/2013   MCV 86.6 04/05/2013   PLT 293 04/05/2013   Lab Results  Component Value Date   CREATININE 0.96 04/05/2013   BUN 12 04/05/2013   NA 142 04/05/2013   K 4.3 04/05/2013   CL 106 04/05/2013   CO2 27 04/05/2013   Lab Results  Component Value Date   ALT 19 04/05/2013   AST 17 04/05/2013   ALKPHOS 62 04/05/2013   BILITOT 0.5 04/05/2013   Lab Results  Component Value Date   CHOL 166 04/05/2013   Lab Results  Component Value Date   HDL 35* 04/05/2013   Lab Results  Component Value Date   LDLCALC 93 04/05/2013   Lab Results  Component Value Date   TRIG 189*  04/05/2013   Lab Results  Component Value Date   CHOLHDL 4.7 04/05/2013     Assessment & Plan  Hypertension Well controlled no changes.   Syncope No recent episodes, is feeling well. Her bruises are healing well since last fall, no pain at present  Hypothyroidism tsh very mildly  elevated, continue current dose of Levothyroxine.   UTI (urinary tract infection) asymptomatic

## 2013-04-24 NOTE — Assessment & Plan Note (Signed)
Well controlled no changes 

## 2013-04-24 NOTE — Assessment & Plan Note (Signed)
No recent episodes, is feeling well. Her bruises are healing well since last fall, no pain at present

## 2013-04-24 NOTE — Assessment & Plan Note (Signed)
asymptomatic

## 2013-04-24 NOTE — Assessment & Plan Note (Signed)
tsh very mildly elevated, continue current dose of Levothyroxine.

## 2013-05-20 ENCOUNTER — Telehealth: Payer: Self-pay | Admitting: Family Medicine

## 2013-05-20 DIAGNOSIS — G4700 Insomnia, unspecified: Secondary | ICD-10-CM

## 2013-05-20 MED ORDER — LORAZEPAM 1 MG PO TABS: 1.0000 mg | ORAL_TABLET | Freq: Every evening | ORAL | Status: DC | PRN

## 2013-05-20 NOTE — Telephone Encounter (Signed)
RX sent

## 2013-05-20 NOTE — Telephone Encounter (Signed)
refill-lorazepam 1mg  tab. Take one tablet by mouth at bedtime as needed for anxiety. Last fill 7.29.14

## 2013-05-20 NOTE — Telephone Encounter (Signed)
Please advise refill?  Last RX done on 12-28-12 quantity 30 with 0 refills  If ok fax to [phone]

## 2013-05-20 NOTE — Telephone Encounter (Signed)
OK to refill Lorazepam same strength, same sig #30

## 2013-06-17 ENCOUNTER — Telehealth: Payer: Self-pay | Admitting: Family Medicine

## 2013-06-17 MED ORDER — LEVOTHYROXINE SODIUM 50 MCG PO TABS: 50.0000 ug | ORAL_TABLET | Freq: Every day | ORAL | Status: DC

## 2013-06-17 NOTE — Telephone Encounter (Signed)
refill-levothyroxine 50mcg tablet. Take one tablet by mouth daily. Qty 30 last fill 12.19.14

## 2013-06-20 ENCOUNTER — Telehealth: Payer: Self-pay

## 2013-06-20 MED ORDER — LISINOPRIL 10 MG PO TABS: 10.0000 mg | ORAL_TABLET | Freq: Every day | ORAL | Status: DC

## 2013-06-20 NOTE — Telephone Encounter (Signed)
pts daughter in law called stating that BP went up to 178/90, 2-3 weeks ago

## 2013-06-28 ENCOUNTER — Encounter: Payer: Self-pay | Admitting: Family Medicine

## 2013-06-28 ENCOUNTER — Ambulatory Visit (INDEPENDENT_AMBULATORY_CARE_PROVIDER_SITE_OTHER): Payer: Medicare HMO | Admitting: Family Medicine

## 2013-06-28 VITALS — BP 142/64 | HR 61 | Temp 97.9°F | Ht 65.0 in | Wt 183.0 lb

## 2013-06-28 DIAGNOSIS — F0390 Unspecified dementia without behavioral disturbance: Secondary | ICD-10-CM

## 2013-06-28 DIAGNOSIS — E039 Hypothyroidism, unspecified: Secondary | ICD-10-CM

## 2013-06-28 DIAGNOSIS — F32A Depression, unspecified: Secondary | ICD-10-CM

## 2013-06-28 DIAGNOSIS — F3289 Other specified depressive episodes: Secondary | ICD-10-CM

## 2013-06-28 DIAGNOSIS — I10 Essential (primary) hypertension: Secondary | ICD-10-CM

## 2013-06-28 DIAGNOSIS — F329 Major depressive disorder, single episode, unspecified: Secondary | ICD-10-CM

## 2013-06-28 LAB — RENAL FUNCTION PANEL
Albumin: 4 g/dL (ref 3.5–5.2)
BUN: 12 mg/dL (ref 6–23)
CHLORIDE: 104 meq/L (ref 96–112)
CO2: 29 mEq/L (ref 19–32)
Calcium: 9.4 mg/dL (ref 8.4–10.5)
Creat: 0.86 mg/dL (ref 0.50–1.10)
Glucose, Bld: 97 mg/dL (ref 70–99)
PHOSPHORUS: 3.4 mg/dL (ref 2.3–4.6)
Potassium: 4.5 mEq/L (ref 3.5–5.3)
Sodium: 141 mEq/L (ref 135–145)

## 2013-06-28 MED ORDER — CITALOPRAM HYDROBROMIDE 10 MG PO TABS: 5.0000 mg | ORAL_TABLET | Freq: Every day | ORAL | Status: DC

## 2013-06-28 NOTE — Patient Instructions (Signed)

## 2013-06-28 NOTE — Progress Notes (Signed)
Subjective:     Patient ID: [name], female   DOB: [birth year], 80 y.o.   MRN: [id number]  CC: Pt here for f/u UTI in November 2014.   HPI  1. Pt reports stopping Citalopram because it made her feel tired. Pt reports getting 7-8 hrs of sleep per night, no sleep disturbance. Pt was taking Lorazepam for nights if she took naps earlier in the day due to Citalopram. Pt does not feel she needs to be on depression medicine. Pt denies any falls in the household.   2. Went to christmas event and got confused with recognizing daughter. Daughter-in-law states that pt gets lost on drive to local store, puts things down and forgets where they are. Pt is very bothered by this. Pt reports using Lorazepam prn for jitteriness.   3. Pt denies UTI symptoms as documented in ROS.    4. Elevated BP per daughter-in-law 178/90 in December. Pt recalls experiencing dizziness and lack of energy for the last week of December until she restarted Lisinopril. Has not had any headaches, dizziness, or fatigue since restart of BP medicine.   5. Pt reports left knee arthritis and feeling that "her knee is going to give out." She takes Magnesium for the muscle cramps in her left thigh. Pt uses 4-stance cane for assistance. Pt has had no falls in the household but her daughter-in-law in concerned about potential falls and encourages the patient to use her cane all the time with walking.   Review of Systems  Constitutional: Positive for fatigue. Negative for fever and chills.  Respiratory: Negative for shortness of breath.   Cardiovascular: Negative for chest pain and palpitations.  Gastrointestinal: Negative for nausea and abdominal pain.  Genitourinary: Negative for dysuria, urgency and hematuria.  Musculoskeletal: Positive for arthralgias and myalgias.  Neurological: Positive for dizziness and headaches.  Psychiatric/Behavioral: Positive for confusion. Negative for sleep disturbance. The patient is nervous/anxious.      Current outpatient prescriptions:benzonatate (TESSALON) 100 MG capsule, Take 1 capsule (100 mg total) by mouth 3 (three) times daily as needed for cough., Disp: 40 capsule, Rfl: 1;  levothyroxine (SYNTHROID, LEVOTHROID) 50 MCG tablet, Take 1 tablet (50 mcg total) by mouth daily., Disp: 30 tablet, Rfl: 3;  lisinopril (PRINIVIL,ZESTRIL) 10 MG tablet, Take 1 tablet (10 mg total) by mouth daily., Disp: 30 tablet, Rfl: 3 LORazepam (ATIVAN) 1 MG tablet, Take 1 tablet (1 mg total) by mouth at bedtime as needed for anxiety (for anxiety)., Disp: 30 tablet, Rfl: 0;  Magnesium 250 MG TABS, Take 1 tablet by mouth daily., Disp: , Rfl: ;  mupirocin ointment (BACTROBAN) 2 %, Apply 1 application topically 2 (two) times daily. As needed for abrasion, Disp: 30 g, Rfl: 1  Past Medical History  Diagnosis Date  . Arthritis   . Chicken pox   . Chronic kidney disease   . Colon polyps   . Hypertension   . Dementia   . Angina   . Dysrhythmia   . Heart murmur   . Shortness of breath   . Recurrent upper respiratory infection (URI)   . Pneumonia   . Hypothyroidism   . Anemia   . Headache(784.0)   . Anxiety   . Depression   . Valvular heart disease 09/04/2012  . Left leg pain 10/02/2012  . UTI (urinary tract infection) 12/30/2012  . Acute bronchitis 04/10/2013   BP Readings from Last 3 Encounters:  06/28/13 142/64  04/19/13 132/78  04/05/13 122/70   Wt Readings from Last  3 Encounters:  06/28/13 183 lb (83.008 kg)  04/19/13 181 lb 1.9 oz (82.155 kg)  04/05/13 182 lb 0.6 oz (82.573 kg)       Objective:   Physical Exam  Constitutional: She appears well-developed and well-nourished. No distress.  HENT:  Head: Normocephalic and atraumatic.  Eyes: Right eye exhibits no discharge. Left eye exhibits no discharge.  Cardiovascular:  Varicosities noted posterior left lower leg.   Pulmonary/Chest: Effort normal. No respiratory distress.  Psychiatric: She has a normal mood and affect. Her speech is normal and  behavior is normal. Thought content normal. Cognition and memory are impaired. She exhibits abnormal recent memory.  Pt recalls stories differently when asked about experiences in the last month.     Filed Vitals:   06/28/13 1006  BP: 142/64  Pulse: 61  Temp: 97.9 F (36.6 C)       Assessment:      1. HTN managed with Lisinopril. 2. Mild cognitive impairment reassessed with MMSE per Josph Macho, RMA.  3. Hypothyroidism managed with Levothyroxine. 4. Anxiety managed with Lorazepam & Citalopram.  5. Muscle spasms left leg, managed with Magnesium and left leg pain from varicosities managed with use of assistive device (cane).      Plan:      1.  Labs today including renal and liver panels.  2. Restart Citalopram at 5 mg h.s. 3 . MMSE q 6 mo starting today. 4. Pt and daughter-in-law were educated about safe driving regarding patient's memory impairments.  5. F/u 2-3 mo to assess tolerance of Citalopram before increasing dose to efficacious amount.   01.27.2015  [place] Hausaden, PA-S.  Patient seen, interviewed and examined with student, agree with documentation  Dementia Her MMSE today was 24 of 30, they had stopped Celexa but have chosen to restart this at 5 mg as opposed to starting Aricept at present will discuss further treatment at next visit.  Hypothyroidism Well treated on current dose of meds. No changes  Hypertension Well controlled no changes.

## 2013-06-28 NOTE — Progress Notes (Signed)
Pre visit review using our clinic review tool, if applicable. No additional management support is needed unless otherwise documented below in the visit note. 

## 2013-06-29 ENCOUNTER — Encounter: Payer: Self-pay | Admitting: Family Medicine

## 2013-06-29 NOTE — Assessment & Plan Note (Signed)
Well treated on current dose of meds. No changes

## 2013-06-29 NOTE — Assessment & Plan Note (Signed)
Well controlled no changes 

## 2013-06-29 NOTE — Assessment & Plan Note (Signed)
Her MMSE today was 24 of 30, they had stopped Celexa but have chosen to restart this at 5 mg as opposed to starting Aricept at present will discuss further treatment at next visit.

## 2013-07-01 ENCOUNTER — Telehealth: Payer: Self-pay | Admitting: Family Medicine

## 2013-07-01 NOTE — Telephone Encounter (Signed)
Please advise 

## 2013-07-01 NOTE — Telephone Encounter (Signed)
Yes I believe she will tolerate Citalopram 10 mg daily just fine I just did this to let her get started slowly.

## 2013-07-01 NOTE — Telephone Encounter (Signed)
Citalopram 10 mg  Deborah HanlyStella is to take 5 mg  But the pill is too small to cut in half.  Would 10mg  be ok to take

## 2013-07-01 NOTE — Telephone Encounter (Signed)
Left a message for pt to return my call. 

## 2013-07-04 NOTE — Telephone Encounter (Signed)
Notified Joann of below information. She states she did give pt 1 whole tablet last night and the pt said she had trouble falling asleep and stayed awaked the whole night.  Advised Joann to try it for a few more nights and if this continues to call and let us know.  She voices understanding.

## 2013-07-06 ENCOUNTER — Telehealth: Payer: Self-pay | Admitting: Family Medicine

## 2013-07-06 NOTE — Telephone Encounter (Signed)
Relevant patient education mailed to patient.  

## 2013-07-27 ENCOUNTER — Other Ambulatory Visit: Payer: Self-pay | Admitting: Family Medicine

## 2013-07-27 NOTE — Telephone Encounter (Signed)
Next appt 08/30/13.  Rx printed and forwarded to Provider for signature:  Medication name:  Name from pharmacy:  LORazepam (ATIVAN) 1 MG tablet  LORAZEPAM 1 MG TABLET Sig: TAKE 1 TABLET BY MOUTH AT BEDTIME AS NEEDED FOR ANXIETY Dispense: 30 tablet Refills: 0 Start: 07/27/2013 Class: Normal Requested on: 05/20/2013 Originally ordered on: 08/13/2012 Last refill: 05/20/2013

## 2013-07-27 NOTE — Telephone Encounter (Signed)
RX faxed

## 2013-07-28 ENCOUNTER — Other Ambulatory Visit: Payer: Self-pay | Admitting: Family Medicine

## 2013-07-29 NOTE — Telephone Encounter (Signed)
Left detailed message on pharmacy voicemail that Lorazepam refill was faxed to them on 07/27/13, #30 x no refills and to call if any questions.

## 2013-08-04 ENCOUNTER — Emergency Department (HOSPITAL_COMMUNITY): Payer: Medicare HMO

## 2013-08-04 ENCOUNTER — Emergency Department (HOSPITAL_COMMUNITY)
Admission: EM | Admit: 2013-08-04 | Discharge: 2013-08-04 | Disposition: A | Discharge status: Home / Self Care | Payer: Medicare HMO | Attending: Emergency Medicine | Admitting: Emergency Medicine

## 2013-08-04 ENCOUNTER — Encounter (HOSPITAL_COMMUNITY): Payer: Self-pay | Admitting: Emergency Medicine

## 2013-08-04 DIAGNOSIS — Z862 Personal history of diseases of the blood and blood-forming organs and certain disorders involving the immune mechanism: Secondary | ICD-10-CM | POA: Insufficient documentation

## 2013-08-04 DIAGNOSIS — N189 Chronic kidney disease, unspecified: Secondary | ICD-10-CM | POA: Insufficient documentation

## 2013-08-04 DIAGNOSIS — Z860100 Personal history of colon polyps, unspecified: Secondary | ICD-10-CM | POA: Insufficient documentation

## 2013-08-04 DIAGNOSIS — R51 Headache: Secondary | ICD-10-CM | POA: Insufficient documentation

## 2013-08-04 DIAGNOSIS — R011 Cardiac murmur, unspecified: Secondary | ICD-10-CM | POA: Insufficient documentation

## 2013-08-04 DIAGNOSIS — F0390 Unspecified dementia without behavioral disturbance: Secondary | ICD-10-CM | POA: Insufficient documentation

## 2013-08-04 DIAGNOSIS — Z79899 Other long term (current) drug therapy: Secondary | ICD-10-CM | POA: Insufficient documentation

## 2013-08-04 DIAGNOSIS — Z8709 Personal history of other diseases of the respiratory system: Secondary | ICD-10-CM | POA: Insufficient documentation

## 2013-08-04 DIAGNOSIS — Z87440 Personal history of urinary (tract) infections: Secondary | ICD-10-CM | POA: Insufficient documentation

## 2013-08-04 DIAGNOSIS — Z87891 Personal history of nicotine dependence: Secondary | ICD-10-CM | POA: Insufficient documentation

## 2013-08-04 DIAGNOSIS — R42 Dizziness and giddiness: Secondary | ICD-10-CM

## 2013-08-04 DIAGNOSIS — Z8739 Personal history of other diseases of the musculoskeletal system and connective tissue: Secondary | ICD-10-CM | POA: Insufficient documentation

## 2013-08-04 DIAGNOSIS — Z8639 Personal history of other endocrine, nutritional and metabolic disease: Secondary | ICD-10-CM | POA: Insufficient documentation

## 2013-08-04 DIAGNOSIS — Z8619 Personal history of other infectious and parasitic diseases: Secondary | ICD-10-CM | POA: Insufficient documentation

## 2013-08-04 DIAGNOSIS — F411 Generalized anxiety disorder: Secondary | ICD-10-CM | POA: Insufficient documentation

## 2013-08-04 DIAGNOSIS — Z8701 Personal history of pneumonia (recurrent): Secondary | ICD-10-CM | POA: Insufficient documentation

## 2013-08-04 DIAGNOSIS — E039 Hypothyroidism, unspecified: Secondary | ICD-10-CM | POA: Insufficient documentation

## 2013-08-04 DIAGNOSIS — I129 Hypertensive chronic kidney disease with stage 1 through stage 4 chronic kidney disease, or unspecified chronic kidney disease: Secondary | ICD-10-CM | POA: Insufficient documentation

## 2013-08-04 DIAGNOSIS — I209 Angina pectoris, unspecified: Secondary | ICD-10-CM | POA: Insufficient documentation

## 2013-08-04 DIAGNOSIS — Z8659 Personal history of other mental and behavioral disorders: Secondary | ICD-10-CM | POA: Insufficient documentation

## 2013-08-04 LAB — CBC WITH DIFFERENTIAL/PLATELET
Basophils Absolute: 0 10*3/uL (ref 0.0–0.1)
Basophils Relative: 0 % (ref 0–1)
Eosinophils Absolute: 0.1 10*3/uL (ref 0.0–0.7)
Eosinophils Relative: 1 % (ref 0–5)
HCT: 34.1 % — ABNORMAL LOW (ref 36.0–46.0)
Hemoglobin: 11.9 g/dL — ABNORMAL LOW (ref 12.0–15.0)
Lymphocytes Relative: 29 % (ref 12–46)
Lymphs Abs: 2.1 10*3/uL (ref 0.7–4.0)
MCH: 30.4 pg (ref 26.0–34.0)
MCHC: 34.9 g/dL (ref 30.0–36.0)
MCV: 87 fL (ref 78.0–100.0)
Monocytes Absolute: 0.4 10*3/uL (ref 0.1–1.0)
Monocytes Relative: 5 % (ref 3–12)
Neutro Abs: 4.7 10*3/uL (ref 1.7–7.7)
Neutrophils Relative %: 65 % (ref 43–77)
Platelets: 196 10*3/uL (ref 150–400)
RBC: 3.92 MIL/uL (ref 3.87–5.11)
RDW: 12.9 % (ref 11.5–15.5)
WBC: 7.3 10*3/uL (ref 4.0–10.5)

## 2013-08-04 LAB — COMPREHENSIVE METABOLIC PANEL
ALT: 12 U/L (ref 0–35)
AST: 14 U/L (ref 0–37)
Albumin: 3.2 g/dL — ABNORMAL LOW (ref 3.5–5.2)
Alkaline Phosphatase: 75 U/L (ref 39–117)
BUN: 17 mg/dL (ref 6–23)
CO2: 25 mEq/L (ref 19–32)
Calcium: 9.1 mg/dL (ref 8.4–10.5)
Chloride: 105 mEq/L (ref 96–112)
Creatinine, Ser: 1.4 mg/dL — ABNORMAL HIGH (ref 0.50–1.10)
GFR calc Af Amer: 37 mL/min — ABNORMAL LOW (ref >90)
GFR calc non Af Amer: 32 mL/min — ABNORMAL LOW (ref >90)
Glucose, Bld: 109 mg/dL — ABNORMAL HIGH (ref 70–99)
Potassium: 4.6 mEq/L (ref 3.7–5.3)
Sodium: 141 mEq/L (ref 137–147)
Total Bilirubin: 0.3 mg/dL (ref 0.3–1.2)
Total Protein: 6.6 g/dL (ref 6.0–8.3)

## 2013-08-04 LAB — URINALYSIS, ROUTINE W REFLEX MICROSCOPIC
Bilirubin Urine: NEGATIVE
Glucose, UA: NEGATIVE mg/dL
Hgb urine dipstick: NEGATIVE
Ketones, ur: NEGATIVE mg/dL
Nitrite: NEGATIVE
Protein, ur: NEGATIVE mg/dL
Specific Gravity, Urine: 1.009 (ref 1.005–1.030)
Urobilinogen, UA: 0.2 mg/dL (ref 0.0–1.0)
pH: 6.5 (ref 5.0–8.0)

## 2013-08-04 LAB — URINE MICROSCOPIC-ADD ON

## 2013-08-04 MED ORDER — SODIUM CHLORIDE 0.9 % IV BOLUS (SEPSIS)
500.0000 mL | INTRAVENOUS | Status: AC
  Administered 2013-08-04: 500 mL via INTRAVENOUS

## 2013-08-04 NOTE — ED Provider Notes (Signed)
CSN: [id number]     Arrival date & time 08/04/13  1556 History   First MD Initiated Contact with Patient 08/04/13 1559     Chief Complaint  Patient presents with  . Generalized Weakness      (Consider location/radiation/quality/duration/timing/severity/associated sxs/prior Treatment) HPI Patient presents to the emergency department with generalized pressure in her head along with lightheadedness.  Patient, states, has been ongoing issue, but got worse recently.  The patient, states, that she does not have any nausea, vomiting, diarrhea, weakness, fever, cough, rash, back pain, neck pain chest pain, shortness of breath, or syncope patient states, that this been ongoing for quite a while, but seems to get worse over the last couple weeks.  Patient has seen her primary care Dr. for this complaint. Past Medical History  Diagnosis Date  . Arthritis   . Chicken pox   . Chronic kidney disease   . Colon polyps   . Hypertension   . Dementia   . Angina   . Dysrhythmia   . Heart murmur   . Shortness of breath   . Recurrent upper respiratory infection (URI)   . Pneumonia   . Hypothyroidism   . Anemia   . Headache(784.0)   . Anxiety   . Depression   . Valvular heart disease 09/04/2012  . Left leg pain 10/02/2012  . UTI (urinary tract infection) 12/30/2012  . Acute bronchitis 04/10/2013   Past Surgical History  Procedure Laterality Date  . Appendectomy    . Cholecystectomy    . Kidney stones    . Tonsillectomy    . Abdominal hysterectomy    . Hand surgery    . Feet surgery    . Shoulder arthroscopy    . Back surgery    . Tonsillectomy    . Eye surgery      cataract removed and eye lids lifted  . Fracture surgery      bilateral arms  . Tubal ligation    . Colonoscopy w/ polypectomy     Family History  Problem Relation Age of Onset  . Heart disease Mother   . Heart disease Father   . Heart disease Other   . Birth defects Other    History  Substance Use Topics  . Smoking  status: Former Smoker -- 0.20 packs/day for 1 years    Types: Cigarettes    Quit date: 06/02/1941  . Smokeless tobacco: Never Used  . Alcohol Use: No   OB History   Grav Para Term Preterm Abortions TAB SAB Ect Mult Living                 Review of Systems  All other systems negative except as documented in the HPI. All pertinent positives and negatives as reviewed in the HPI.   Allergies  Codeine and Hydrocodone  Home Medications   Current Outpatient Rx  Name  Route  Sig  Dispense  Refill  . benzonatate (TESSALON) 100 MG capsule   Oral   Take 100 mg by mouth 3 (three) times daily as needed for cough.         . levothyroxine (SYNTHROID, LEVOTHROID) 50 MCG tablet   Oral   Take 1 tablet (50 mcg total) by mouth daily.   30 tablet   3   . lisinopril (PRINIVIL,ZESTRIL) 10 MG tablet   Oral   Take 1 tablet (10 mg total) by mouth daily.   30 tablet   3   . LORazepam (ATIVAN) 1 MG  tablet   Oral   Take 1 mg by mouth at bedtime as needed for sleep.         . Magnesium 250 MG TABS   Oral   Take 250 mg by mouth daily.           BP 194/67  Pulse 71  Temp(Src) 98.2 F (36.8 C) (Oral)  Resp 14  SpO2 99% Physical Exam  Nursing note and vitals reviewed. Constitutional: She is oriented to person, place, and time. She appears well-developed and well-nourished. No distress.  HENT:  Head: Normocephalic and atraumatic.  Mouth/Throat: Oropharynx is clear and moist.  Eyes: Pupils are equal, round, and reactive to light.  Neck: Normal range of motion. Neck supple.  Cardiovascular: Normal rate, regular rhythm and normal heart sounds.   Pulmonary/Chest: Effort normal and breath sounds normal. No respiratory distress.  Neurological: She is alert and oriented to person, place, and time. She has normal strength and normal reflexes. No cranial nerve deficit or sensory deficit. She exhibits normal muscle tone. Coordination and gait normal. GCS eye subscore is 4. GCS verbal  subscore is 5. GCS motor subscore is 6.  Skin: Skin is warm and dry.    ED Course  Procedures (including critical care time) Labs Review Labs Reviewed  CBC WITH DIFFERENTIAL - Abnormal; Notable for the following:    Hemoglobin 11.9 (*)    HCT 34.1 (*)    All other components within normal limits  COMPREHENSIVE METABOLIC PANEL - Abnormal; Notable for the following:    Glucose, Bld 109 (*)    Creatinine, Ser 1.40 (*)    Albumin 3.2 (*)    GFR calc non Af Amer 32 (*)    GFR calc Af Amer 37 (*)    All other components within normal limits  URINALYSIS, ROUTINE W REFLEX MICROSCOPIC - Abnormal; Notable for the following:    Leukocytes, UA TRACE (*)    All other components within normal limits  URINE MICROSCOPIC-ADD ON - Abnormal; Notable for the following:    Squamous Epithelial / LPF FEW (*)    All other components within normal limits   Imaging Review Ct Head Wo Contrast  08/04/2013   CLINICAL DATA:  Generalized weakness and decreased appetite. Headache and dizziness.  EXAM: CT HEAD WITHOUT CONTRAST  TECHNIQUE: Contiguous axial images were obtained from the base of the skull through the vertex without intravenous contrast.  COMPARISON:  MRI brain 12/18/2012.  FINDINGS: Mild atrophy and white matter disease is similar to the prior exam. No acute cortical infarct, hemorrhage, or mass lesion is present. The ventricles are proportionate to the degree of atrophy. No significant extra-axial fluid collection is present.  Fluid is present in the left sphenoid sinus. The paranasal sinuses and mastoid air cells are otherwise clear. The osseous skull is intact.  IMPRESSION: 1. Stable atrophy and white matter disease, within normal limits for age. 2. No acute intracranial abnormality. 3. Fluid in the left sphenoid sinus.   Electronically Signed   By: Gennette Pac M.D.   On: 08/04/2013 17:32     EKG Interpretation   Date/Time:  Thursday August 04 2013 16:16:10 EST Ventricular Rate:  50 PR Interval:   195 QRS Duration: 157 QT Interval:  467 QTC Calculation: 426 R Axis:   -57 Text Interpretation:  Sinus rhythm RBBB and LAFB No significant change  since last tracing Confirmed by WARD,  DO, KRISTEN ([id number]) on 08/04/2013  4:29:05 PM      MDM  Patient will be advised followup with her primary care Dr. for further evaluation and recheck.  Patient is ambulating without difficulty.  She normally uses a walker, which shoes.  He believes in the emergency room and walk with normal gait.  Patient has had a negative workup here in the emergency department.  The family is advised of the findings and all questions were answered with the chronic nature of these complaints.  The patient is eventually further workup    The patient has been seen by the attending Physician.   Carlyle Dolly, PA-C 08/06/13 0030

## 2013-08-04 NOTE — Discharge Instructions (Signed)
Followup with your primary care Dr. for recheck.  Return here as needed.  Your testing here tonight, was within normal limits

## 2013-08-04 NOTE — ED Provider Notes (Signed)
Medical screening examination/treatment/procedure(s) were conducted as a shared visit with non-physician practitioner(s) and myself.  I personally evaluated the patient during the encounter.   EKG Interpretation   Date/Time:  Thursday August 04 2013 16:16:10 EST Ventricular Rate:  50 PR Interval:  195 QRS Duration: 157 QT Interval:  467 QTC Calculation: 426 R Axis:   -57 Text Interpretation:  Sinus rhythm RBBB and LAFB No significant change  since last tracing Confirmed by WARD,  DO, KRISTEN [id number]) on 08/04/2013  4:29:05 PM      Pt is a 80 y.o. F with h/o hypothyroidism, chronic kidney disease, hypertension who presents to the emergency department complains of months of intermittent diffuse, throbbing headaches and vertigo that normally last for several seconds and then resolves. She currently has no headache or vertigo. She denies any chest pain or shortness of breath with these episodes. No recent vomiting, diarrhea, bloody stool or melena. She has chronic numbness and weakness in her left lower extremity but no new numbness or weakness. She denies a thunderclap headache. No recent head injury. She is on anticoagulation. EKG shows no new ischemic changes. Labs are unremarkable. Urine shows no sign of infection. Head CT negative. Not concerned for a life-threatening illness. Have discussed this with patient and family and they will followup with their primary doctor.  Layla MawKristen N Ward, DO 08/04/13 [phone]

## 2013-08-04 NOTE — ED Notes (Signed)
[facility] c/o generalized weakness and decreased appetite x2 days. Pt had no arm or leg drift, equal hand grips. Alert and oriented x4. Per family pt has had increased dementia. Pt ambulatory and denies pain. Pt in NAD

## 2013-08-30 ENCOUNTER — Encounter: Payer: Self-pay | Admitting: Family Medicine

## 2013-08-30 ENCOUNTER — Ambulatory Visit (INDEPENDENT_AMBULATORY_CARE_PROVIDER_SITE_OTHER): Payer: Medicare HMO | Admitting: Family Medicine

## 2013-08-30 VITALS — BP 124/72 | HR 50 | Temp 97.7°F | Ht 65.0 in | Wt 184.8 lb

## 2013-08-30 DIAGNOSIS — G4700 Insomnia, unspecified: Secondary | ICD-10-CM

## 2013-08-30 DIAGNOSIS — E860 Dehydration: Secondary | ICD-10-CM

## 2013-08-30 DIAGNOSIS — H81399 Other peripheral vertigo, unspecified ear: Secondary | ICD-10-CM

## 2013-08-30 DIAGNOSIS — I10 Essential (primary) hypertension: Secondary | ICD-10-CM

## 2013-08-30 DIAGNOSIS — F0390 Unspecified dementia without behavioral disturbance: Secondary | ICD-10-CM

## 2013-08-30 MED ORDER — LORAZEPAM 1 MG PO TABS: 1.0000 mg | ORAL_TABLET | Freq: Every evening | ORAL | Status: DC | PRN

## 2013-08-30 NOTE — Patient Instructions (Signed)
  Increase fluids to at least 2 bottles of water a day, 4 is better   Vertigo Vertigo means you feel like you or your surroundings are moving when they are not. Vertigo can be dangerous if it occurs when you are at work, driving, or performing difficult activities.  CAUSES  Vertigo occurs when there is a conflict of signals sent to your brain from the visual and sensory systems in your body. There are many different causes of vertigo, including:  Infections, especially in the inner ear.  A bad reaction to a drug or misuse of alcohol and medicines.  Withdrawal from drugs or alcohol.  Rapidly changing positions, such as lying down or rolling over in bed.  A migraine headache.  Decreased blood flow to the brain.  Increased pressure in the brain from a head injury, infection, tumor, or bleeding. SYMPTOMS  You may feel as though the world is spinning around or you are falling to the ground. Because your balance is upset, vertigo can cause nausea and vomiting. You may have involuntary eye movements (nystagmus). DIAGNOSIS  Vertigo is usually diagnosed by physical exam. If the cause of your vertigo is unknown, your caregiver may perform imaging tests, such as an MRI scan (magnetic resonance imaging). TREATMENT  Most cases of vertigo resolve on their own, without treatment. Depending on the cause, your caregiver may prescribe certain medicines. If your vertigo is related to body position issues, your caregiver may recommend movements or procedures to correct the problem. In rare cases, if your vertigo is caused by certain inner ear problems, you may need surgery. HOME CARE INSTRUCTIONS   Follow your caregiver's instructions.  Avoid driving.  Avoid operating heavy machinery.  Avoid performing any tasks that would be dangerous to you or others during a vertigo episode.  Tell your caregiver if you notice that certain medicines seem to be causing your vertigo. Some of the medicines used to  treat vertigo episodes can actually make them worse in some people. SEEK IMMEDIATE MEDICAL CARE IF:   Your medicines do not relieve your vertigo or are making it worse.  You develop problems with talking, walking, weakness, or using your arms, hands, or legs.  You develop severe headaches.  Your nausea or vomiting continues or gets worse.  You develop visual changes.  A family member notices behavioral changes.  Your condition gets worse. MAKE SURE YOU:  Understand these instructions.  Will watch your condition.  Will get help right away if you are not doing well or get worse. Document Released: 02/26/2005 Document Revised: 08/11/2011 Document Reviewed: 12/05/2010 [facility] Patient Information 2014 [employer] [employer] [place].

## 2013-08-30 NOTE — Progress Notes (Signed)
Pre visit review using our clinic review tool, if applicable. No additional management support is needed unless otherwise documented below in the visit note. 

## 2013-08-31 ENCOUNTER — Encounter: Payer: Self-pay | Admitting: Family Medicine

## 2013-08-31 DIAGNOSIS — G4700 Insomnia, unspecified: Secondary | ICD-10-CM | POA: Insufficient documentation

## 2013-08-31 DIAGNOSIS — E860 Dehydration: Secondary | ICD-10-CM | POA: Insufficient documentation

## 2013-08-31 HISTORY — DX: Dehydration: E86.0

## 2013-08-31 NOTE — Assessment & Plan Note (Signed)
Recent episode has not recurred. Did present to ED on 3/5 but no concerns were identified

## 2013-08-31 NOTE — Assessment & Plan Note (Signed)
Uses Lorazepam qhs with good results agrees to try 1/2 tab and only use prn

## 2013-08-31 NOTE — Assessment & Plan Note (Signed)
Recently increased her water intake but is still only getting aobu 24 oz a day, needs to increase fluids more, this will help vertigo not recur

## 2013-08-31 NOTE — Assessment & Plan Note (Signed)
Well controlled, no changes to meds. Encouraged heart healthy diet such as the DASH diet and exercise as tolerated.  °

## 2013-08-31 NOTE — Assessment & Plan Note (Signed)
Did not tolerate Citalopram

## 2013-08-31 NOTE — Progress Notes (Signed)
Patient ID: [name], female   DOB: Feb 11, 1921, 80 y.o.   MRN: [id number] [name] [id number] [birth year] 08/31/2013      Progress Note-Follow Up  Subjective  Chief Complaint  Chief Complaint  Patient presents with  . Follow-up    2 month    HPI  Patient is a 80 year old female in today for routine medical care. She is in today with family. She had a recent bad episode of vertigo that prompted her to go to the emergency room. No concerning findings were noted. She admits she still drinking and eating poorly. Often skips meals and does not drink clear fluids routinely. Drinks caffeinated tea and caffeinated coffee and drinks no more than 24 ounces of clear fluids daily. Did not tolerate citalopram. Says it made her too sleepy. She does note she has trouble sleeping however. Lorazepam is used for to help her rest. No other acute complaints. Denies CP/palp/SOB/HA/congestion/fevers/GI or GU c/o. Taking meds as prescribed  Past Medical History  Diagnosis Date  . Arthritis   . Chicken pox   . Chronic kidney disease   . Colon polyps   . Hypertension   . Dementia   . Angina   . Dysrhythmia   . Heart murmur   . Shortness of breath   . Recurrent upper respiratory infection (URI)   . Pneumonia   . Hypothyroidism   . Anemia   . Headache(784.0)   . Anxiety   . Depression   . Valvular heart disease 09/04/2012  . Left leg pain 10/02/2012  . UTI (urinary tract infection) 12/30/2012  . Acute bronchitis 04/10/2013  . Dehydration 08/31/2013    Past Surgical History  Procedure Laterality Date  . Appendectomy    . Cholecystectomy    . Kidney stones    . Tonsillectomy    . Abdominal hysterectomy    . Hand surgery    . Feet surgery    . Shoulder arthroscopy    . Back surgery    . Tonsillectomy    . Eye surgery      cataract removed and eye lids lifted  . Fracture surgery      bilateral arms  . Tubal ligation    . Colonoscopy w/ polypectomy      Family History  Problem Relation  Age of Onset  . Heart disease Mother   . Heart disease Father   . Heart disease Other   . Birth defects Other     History   Social History  . Marital Status: Widowed    Spouse Name: N/A    Number of Children: N/A  . Years of Education: N/A   Occupational History  . Not on file.   Social History Main Topics  . Smoking status: Former Smoker -- 0.20 packs/day for 1 years    Types: Cigarettes    Quit date: 06/02/1941  . Smokeless tobacco: Never Used  . Alcohol Use: No  . Drug Use: No  . Sexual Activity: Not Currently    Birth Control/ Protection: Abstinence   Other Topics Concern  . Not on file   Social History Narrative   Still lives at home and ambulatory daily. Doesn't use cane or walker, but has cane at home. Remote smoking history.     Current Outpatient Prescriptions on File Prior to Visit  Medication Sig Dispense Refill  . benzonatate (TESSALON) 100 MG capsule Take 100 mg by mouth 3 (three) times daily as needed for cough.      Marland Kitchen  levothyroxine (SYNTHROID, LEVOTHROID) 50 MCG tablet Take 1 tablet (50 mcg total) by mouth daily.  30 tablet  3  . lisinopril (PRINIVIL,ZESTRIL) 10 MG tablet Take 1 tablet (10 mg total) by mouth daily.  30 tablet  3  . Magnesium 250 MG TABS Take 250 mg by mouth daily.        No current facility-administered medications on file prior to visit.    Allergies  Allergen Reactions  . Codeine Rash  . Hydrocodone Rash    Review of Systems  Review of Systems  Constitutional: Negative for fever and malaise/fatigue.  HENT: Negative for congestion.   Eyes: Negative for discharge.  Respiratory: Negative for shortness of breath.   Cardiovascular: Negative for chest pain, palpitations and leg swelling.  Gastrointestinal: Negative for nausea, abdominal pain and diarrhea.  Genitourinary: Negative for dysuria.  Musculoskeletal: Negative for falls.  Skin: Negative for rash.  Neurological: Positive for dizziness. Negative for loss of  consciousness and headaches.  Endo/Heme/Allergies: Negative for polydipsia.  Psychiatric/Behavioral: Positive for memory loss. Negative for depression and suicidal ideas. The patient is not nervous/anxious and does not have insomnia.     Objective  BP 124/72  Pulse 50  Temp(Src) 97.7 F (36.5 C) (Oral)  Ht 5\' 5"  (1.651 m)  Wt 184 lb 12 oz (83.802 kg)  BMI 30.74 kg/m2  SpO2 95%  Physical Exam  Physical Exam  Constitutional: She is oriented to person, place, and time and well-developed, well-nourished, and in no distress. No distress.  HENT:  Head: Normocephalic and atraumatic.  Eyes: Conjunctivae are normal.  Neck: Neck supple. No thyromegaly present.  Cardiovascular: Normal rate and regular rhythm.   Murmur heard. Pulmonary/Chest: Effort normal and breath sounds normal. She has no wheezes.  Abdominal: She exhibits no distension and no mass.  Musculoskeletal: She exhibits no edema.  Lymphadenopathy:    She has no cervical adenopathy.  Neurological: She is alert and oriented to person, place, and time.  Skin: Skin is warm and dry. No rash noted. She is not diaphoretic.  Psychiatric: Memory, affect and judgment normal.    Lab Results  Component Value Date   TSH 2.315 04/05/2013   Lab Results  Component Value Date   WBC 7.3 08/04/2013   HGB 11.9* 08/04/2013   HCT 34.1* 08/04/2013   MCV 87.0 08/04/2013   PLT 196 08/04/2013   Lab Results  Component Value Date   CREATININE 1.40* 08/04/2013   BUN 17 08/04/2013   NA 141 08/04/2013   K 4.6 08/04/2013   CL 105 08/04/2013   CO2 25 08/04/2013   Lab Results  Component Value Date   ALT 12 08/04/2013   AST 14 08/04/2013   ALKPHOS 75 08/04/2013   BILITOT 0.3 08/04/2013   Lab Results  Component Value Date   CHOL 166 04/05/2013   Lab Results  Component Value Date   HDL 35* 04/05/2013   Lab Results  Component Value Date   LDLCALC 93 04/05/2013   Lab Results  Component Value Date   TRIG 189* 04/05/2013   Lab Results  Component Value Date    CHOLHDL 4.7 04/05/2013     Assessment & Plan  Hypertension Well controlled, no changes to meds. Encouraged heart healthy diet such as the DASH diet and exercise as tolerated.   Dehydration Recently increased her water intake but is still only getting aobu 24 oz a day, needs to increase fluids more, this will help vertigo not recur  Vertigo, peripheral Recent episode has not  recurred. Did present to ED on 3/5 but no concerns were identified  Insomnia Uses Lorazepam qhs with good results agrees to try 1/2 tab and only use prn  Dementia Did not tolerate Citalopram

## 2013-09-12 ENCOUNTER — Telehealth: Payer: Self-pay | Admitting: Family Medicine

## 2013-09-12 NOTE — Telephone Encounter (Signed)
Refill-levothyroxine  Refill- lisinopril

## 2013-09-13 MED ORDER — LISINOPRIL 10 MG PO TABS: 10.0000 mg | ORAL_TABLET | Freq: Every day | ORAL | Status: DC

## 2013-09-13 MED ORDER — LEVOTHYROXINE SODIUM 50 MCG PO TABS: 50.0000 ug | ORAL_TABLET | Freq: Every day | ORAL | Status: DC

## 2013-09-13 NOTE — Telephone Encounter (Signed)
Patient's medication sent to pharmacy.  

## 2013-09-16 IMAGING — US US EXTREM LOW VENOUS*L*
1 series · 14 of 24 positions shown · non-contrast
Comparison: None.

CLINICAL DATA: Left calf pain, cramping

LEFT LOWER EXTREMITY VENOUS DUPLEX ULTRASOUND
TECHNIQUE: Gray-scale sonography with graded compression, as well
as color Doppler and duplex ultrasound were performed to evaluate
the deep venous system of the lower extremity from the level of the
common femoral vein through the popliteal and proximal calf veins.
Spectral Doppler was utilized to evaluate flow at rest and with
distal augmentation maneuvers.

[Series 1: us extrem low venous*left* · 14 of 28 slices shown]
[im 1/28]
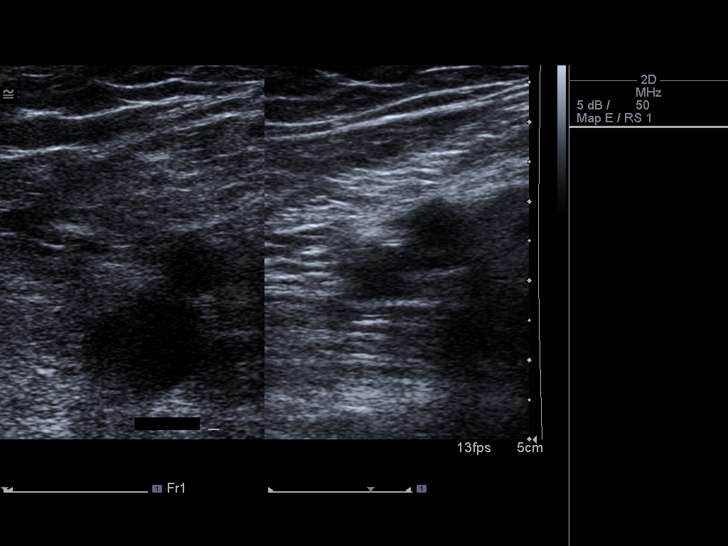
[im 3/28]
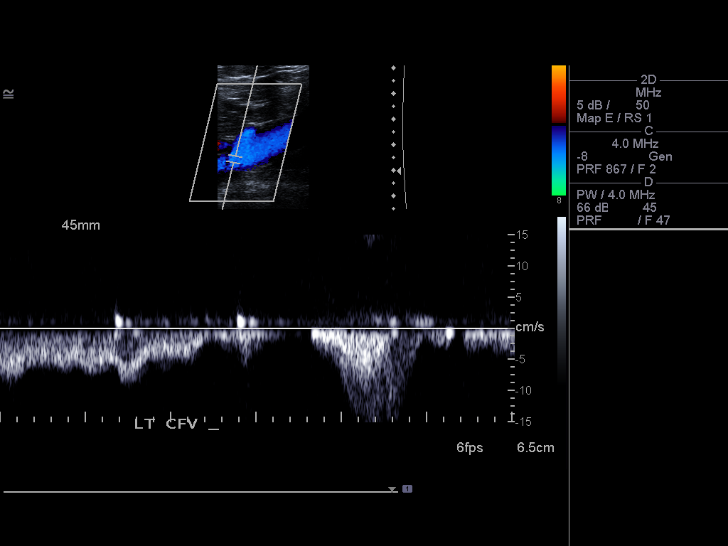
[im 5/28]
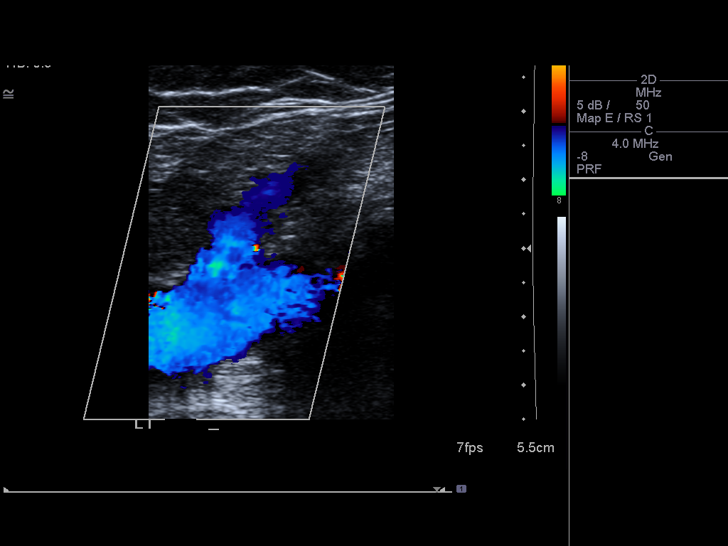
[im 8/28]
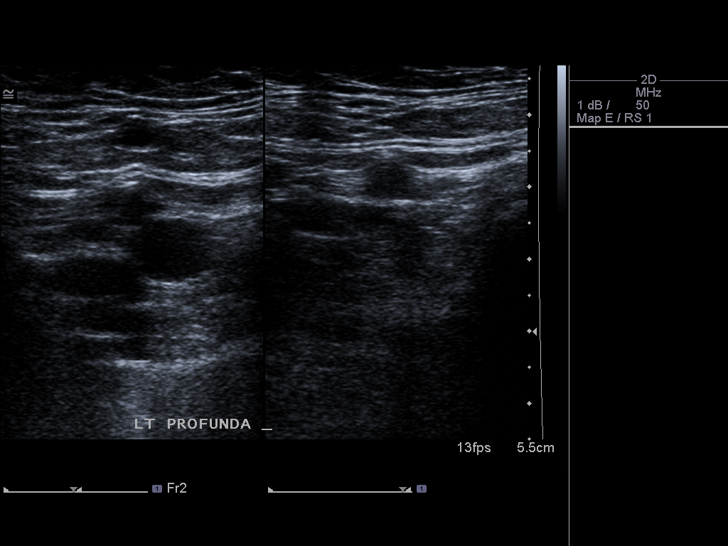
[im 9/28]
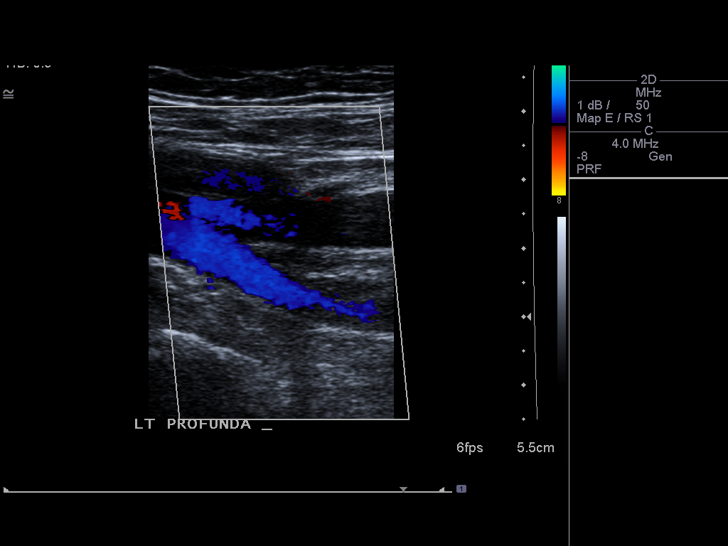
[im 11/28]
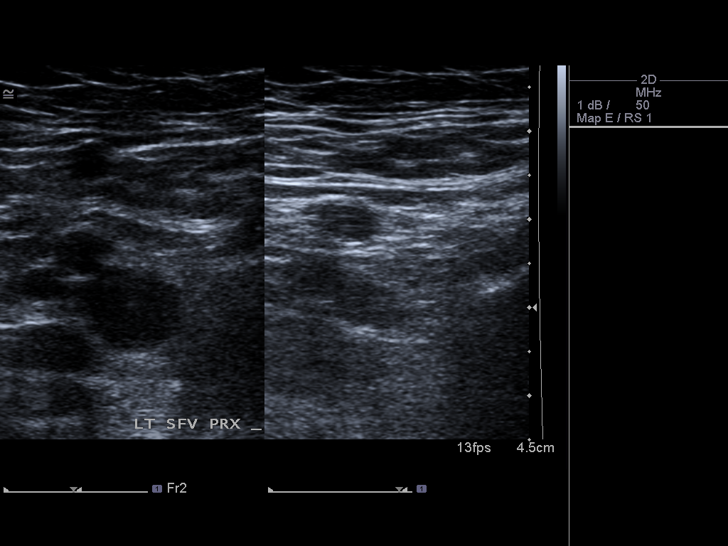
[im 13/28]
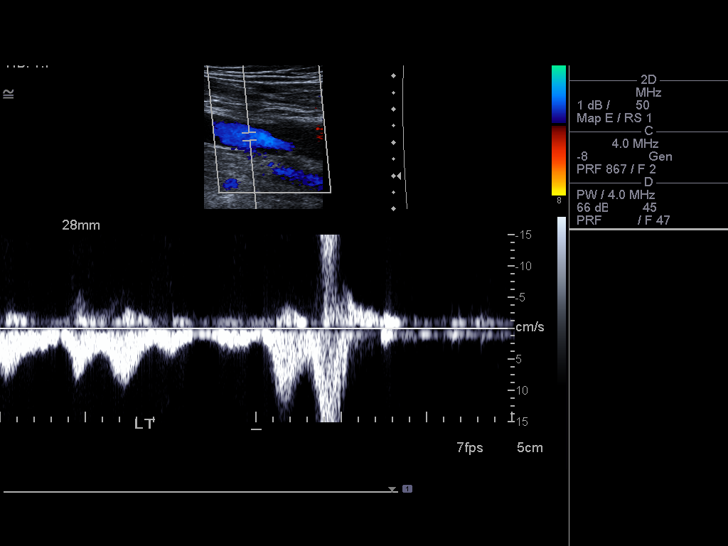
[im 15/28]
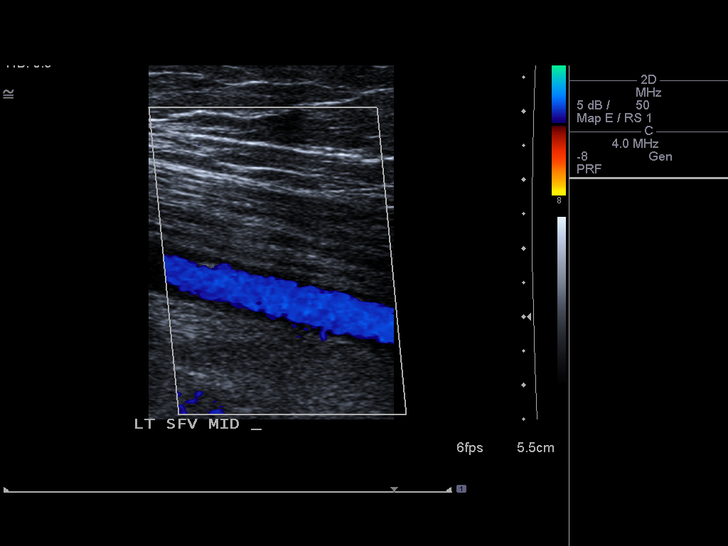
[im 17/28]
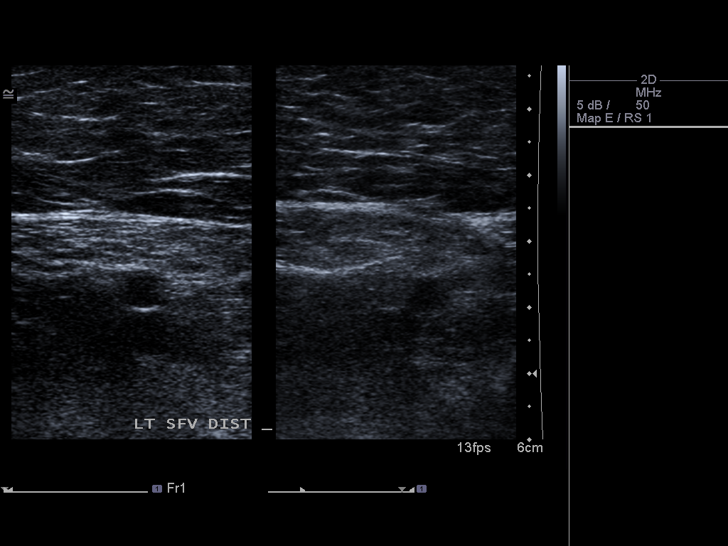
[im 19/28]
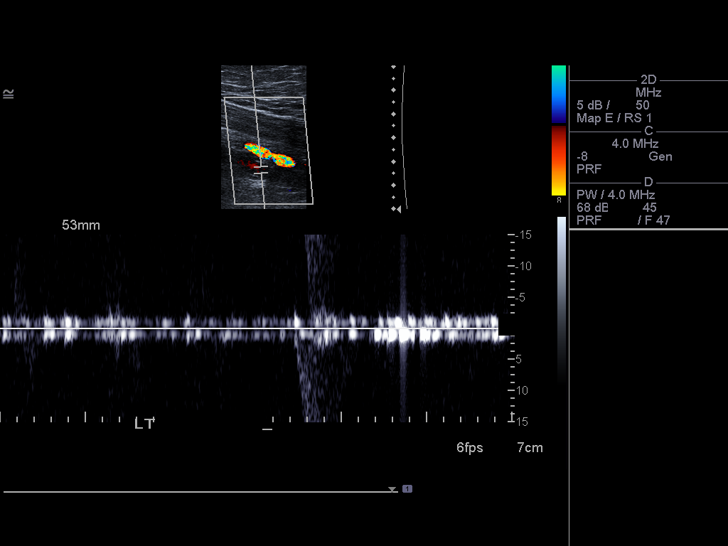
[im 22/28]
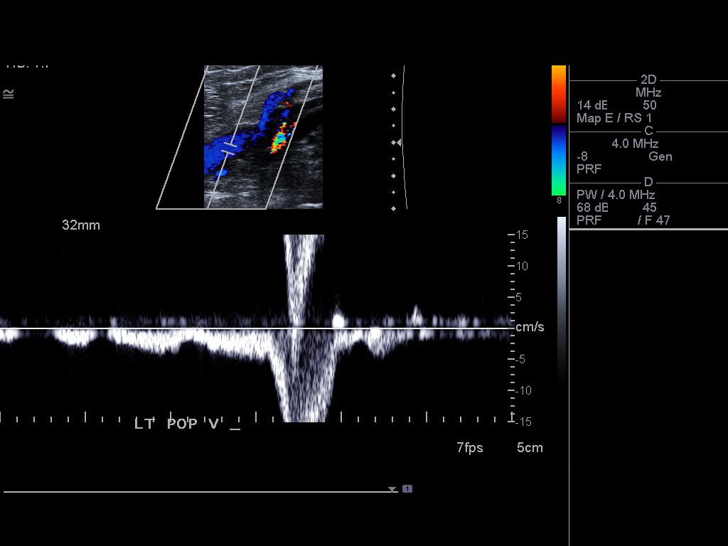
[im 23/28]
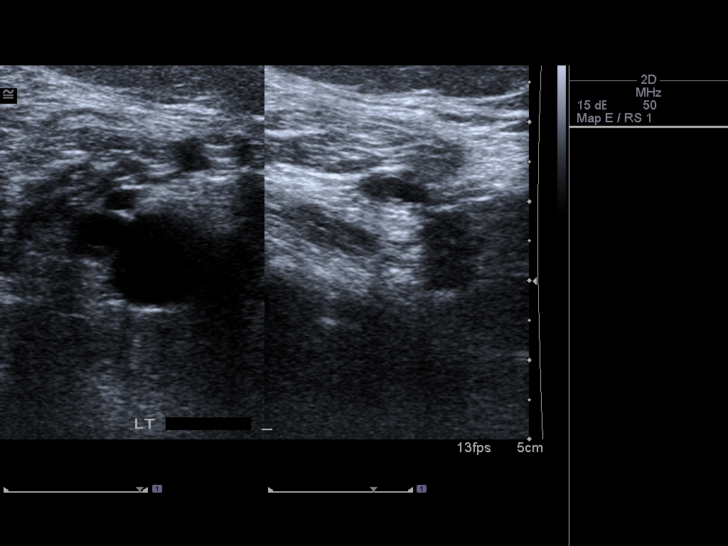
[im 25/28]
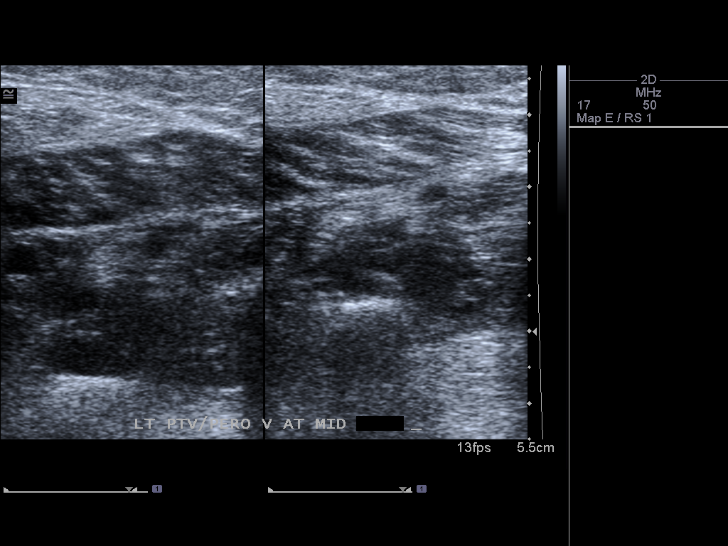
[im 28/28]
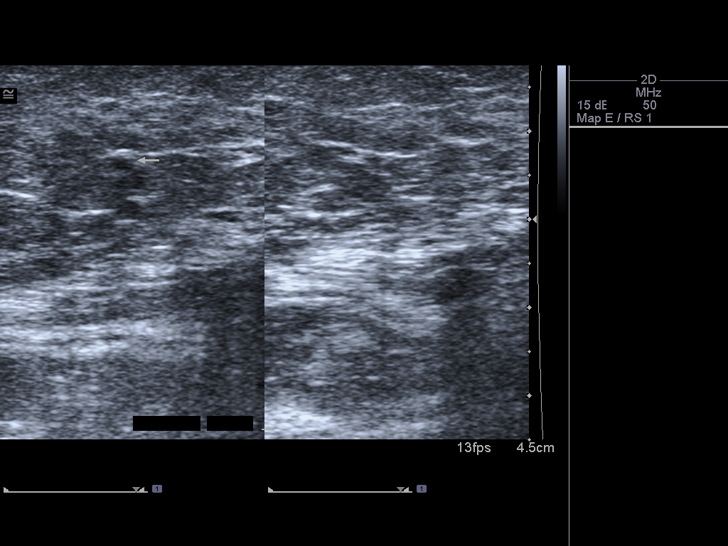

[14 of 24 positions shown; findings below may reference images not displayed]

FINDINGS: Normal compressibility of the common femoral,
superficial femoral, and popliteal veins is demonstrated, as well
as the visualized proximal calf veins.  No filling defects to
suggest DVT on grayscale or color Doppler imaging.  Doppler
waveforms show normal direction of venous flow, normal respiratory
phasicity and response to augmentation.
IMPRESSION: No evidence of lower extremity deep vein thrombosis.

## 2013-09-21 ENCOUNTER — Encounter (HOSPITAL_COMMUNITY): Payer: Self-pay | Admitting: Emergency Medicine

## 2013-09-21 ENCOUNTER — Emergency Department (HOSPITAL_COMMUNITY)
Admission: EM | Admit: 2013-09-21 | Discharge: 2013-09-21 | Disposition: A | Discharge status: Home / Self Care | Payer: Medicare HMO | Attending: Emergency Medicine | Admitting: Emergency Medicine

## 2013-09-21 ENCOUNTER — Emergency Department (HOSPITAL_COMMUNITY): Payer: Medicare HMO

## 2013-09-21 DIAGNOSIS — D649 Anemia, unspecified: Secondary | ICD-10-CM | POA: Insufficient documentation

## 2013-09-21 DIAGNOSIS — F0390 Unspecified dementia without behavioral disturbance: Secondary | ICD-10-CM | POA: Insufficient documentation

## 2013-09-21 DIAGNOSIS — J209 Acute bronchitis, unspecified: Secondary | ICD-10-CM | POA: Insufficient documentation

## 2013-09-21 DIAGNOSIS — Z888 Allergy status to other drugs, medicaments and biological substances status: Secondary | ICD-10-CM | POA: Insufficient documentation

## 2013-09-21 DIAGNOSIS — N189 Chronic kidney disease, unspecified: Secondary | ICD-10-CM | POA: Insufficient documentation

## 2013-09-21 DIAGNOSIS — Z8701 Personal history of pneumonia (recurrent): Secondary | ICD-10-CM | POA: Insufficient documentation

## 2013-09-21 DIAGNOSIS — F3289 Other specified depressive episodes: Secondary | ICD-10-CM | POA: Insufficient documentation

## 2013-09-21 DIAGNOSIS — I120 Hypertensive chronic kidney disease with stage 5 chronic kidney disease or end stage renal disease: Secondary | ICD-10-CM | POA: Insufficient documentation

## 2013-09-21 DIAGNOSIS — F411 Generalized anxiety disorder: Secondary | ICD-10-CM | POA: Insufficient documentation

## 2013-09-21 DIAGNOSIS — R011 Cardiac murmur, unspecified: Secondary | ICD-10-CM | POA: Insufficient documentation

## 2013-09-21 DIAGNOSIS — Z860100 Personal history of colon polyps, unspecified: Secondary | ICD-10-CM | POA: Insufficient documentation

## 2013-09-21 DIAGNOSIS — Z87891 Personal history of nicotine dependence: Secondary | ICD-10-CM | POA: Insufficient documentation

## 2013-09-21 DIAGNOSIS — F329 Major depressive disorder, single episode, unspecified: Secondary | ICD-10-CM | POA: Insufficient documentation

## 2013-09-21 DIAGNOSIS — J40 Bronchitis, not specified as acute or chronic: Secondary | ICD-10-CM

## 2013-09-21 DIAGNOSIS — E039 Hypothyroidism, unspecified: Secondary | ICD-10-CM | POA: Insufficient documentation

## 2013-09-21 DIAGNOSIS — I38 Endocarditis, valve unspecified: Secondary | ICD-10-CM | POA: Insufficient documentation

## 2013-09-21 DIAGNOSIS — M129 Arthropathy, unspecified: Secondary | ICD-10-CM | POA: Insufficient documentation

## 2013-09-21 LAB — CBC
HEMATOCRIT: 35.8 % — AB (ref 36.0–46.0)
HEMOGLOBIN: 12.1 g/dL (ref 12.0–15.0)
MCH: 29.6 pg (ref 26.0–34.0)
MCHC: 33.8 g/dL (ref 30.0–36.0)
MCV: 87.5 fL (ref 78.0–100.0)
Platelets: 232 10*3/uL (ref 150–400)
RBC: 4.09 MIL/uL (ref 3.87–5.11)
RDW: 12.9 % (ref 11.5–15.5)
WBC: 8.8 10*3/uL (ref 4.0–10.5)

## 2013-09-21 LAB — BASIC METABOLIC PANEL
BUN: 17 mg/dL (ref 6–23)
CALCIUM: 9.2 mg/dL (ref 8.4–10.5)
CO2: 22 mEq/L (ref 19–32)
Chloride: 105 mEq/L (ref 96–112)
Creatinine, Ser: 1 mg/dL (ref 0.50–1.10)
GFR calc Af Amer: 55 mL/min — ABNORMAL LOW (ref >90)
GFR calc non Af Amer: 48 mL/min — ABNORMAL LOW (ref >90)
GLUCOSE: 124 mg/dL — AB (ref 70–99)
Potassium: 4.1 mEq/L (ref 3.7–5.3)
Sodium: 142 mEq/L (ref 137–147)

## 2013-09-21 LAB — I-STAT TROPONIN, ED: Troponin i, poc: 0 ng/mL (ref 0.00–0.08)

## 2013-09-21 MED ORDER — AZITHROMYCIN 250 MG PO TABS: 250.0000 mg | ORAL_TABLET | Freq: Every day | ORAL | Status: DC

## 2013-09-21 MED ORDER — HYDROCOD POLST-CHLORPHEN POLST 10-8 MG/5ML PO LQCR: 5.0000 mL | Freq: Two times a day (BID) | ORAL | Status: DC | PRN

## 2013-09-21 MED ORDER — AZITHROMYCIN 250 MG PO TABS
500.0000 mg | ORAL_TABLET | Freq: Once | ORAL | Status: AC
  Administered 2013-09-21: 500 mg via ORAL
  Filled 2013-09-21: qty 2

## 2013-09-21 NOTE — Discharge Instructions (Signed)

## 2013-09-21 NOTE — ED Notes (Addendum)
Pt reports dry cough for 2 days, last night is was worse. Woke up today with non radiating mid chest pain that increase when she coughs. Pt report sob at rest. Skin warm and dry. Denies nausea.

## 2013-09-21 NOTE — ED Provider Notes (Signed)
CSN: [id number]     Arrival date & time 09/21/13  1724 History   First MD Initiated Contact with Patient 09/21/13 2000     Chief Complaint  Patient presents with  . Chest Pain     (Consider location/radiation/quality/duration/timing/severity/associated sxs/prior Treatment) Patient is a 80 y.o. female presenting with chest pain.  Chest Pain  Pt with no history of CAD reports productive cough for the last two days, not associated with fever or SOB. Today she has had moderate aching chest pains with coughing but no pain otherwise. She states when she coughs she feels like she has to bend over because her ribs are hurting. No new leg swelling. No previous history of same.   Past Medical History  Diagnosis Date  . Arthritis   . Chicken pox   . Chronic kidney disease   . Colon polyps   . Hypertension   . Dementia   . Angina   . Dysrhythmia   . Heart murmur   . Shortness of breath   . Recurrent upper respiratory infection (URI)   . Pneumonia   . Hypothyroidism   . Anemia   . Headache(784.0)   . Anxiety   . Depression   . Valvular heart disease 09/04/2012  . Left leg pain 10/02/2012  . UTI (urinary tract infection) 12/30/2012  . Acute bronchitis 04/10/2013  . Dehydration 08/31/2013   Past Surgical History  Procedure Laterality Date  . Appendectomy    . Cholecystectomy    . Kidney stones    . Tonsillectomy    . Abdominal hysterectomy    . Hand surgery    . Feet surgery    . Shoulder arthroscopy    . Back surgery    . Tonsillectomy    . Eye surgery      cataract removed and eye lids lifted  . Fracture surgery      bilateral arms  . Tubal ligation    . Colonoscopy w/ polypectomy     Family History  Problem Relation Age of Onset  . Heart disease Mother   . Heart disease Father   . Heart disease Other   . Birth defects Other    History  Substance Use Topics  . Smoking status: Former Smoker -- 0.20 packs/day for 1 years    Types: Cigarettes    Quit date: 06/02/1941  .  Smokeless tobacco: Never Used  . Alcohol Use: No   OB History   Grav Para Term Preterm Abortions TAB SAB Ect Mult Living                 Review of Systems  Cardiovascular: Positive for chest pain.   All other systems reviewed and are negative except as noted in HPI.     Allergies  Codeine and Hydrocodone  Home Medications   Prior to Admission medications   Medication Sig Start Date End Date Taking? Authorizing Provider  benzonatate (TESSALON) 100 MG capsule Take 100 mg by mouth 3 (three) times daily as needed for cough.    Historical Provider, MD  levothyroxine (SYNTHROID, LEVOTHROID) 50 MCG tablet Take 1 tablet (50 mcg total) by mouth daily. 09/13/13   Bradd CanaryStacey A Blyth, MD  lisinopril (PRINIVIL,ZESTRIL) 10 MG tablet Take 1 tablet (10 mg total) by mouth daily. 09/13/13   Bradd CanaryStacey A Blyth, MD  LORazepam (ATIVAN) 1 MG tablet Take 1 tablet (1 mg total) by mouth at bedtime as needed for sedation or sleep. 08/30/13   Bradd CanaryStacey A Blyth, MD  Magnesium 250 MG TABS Take 250 mg by mouth daily.     Historical Provider, MD   BP 159/46  Pulse 68  Temp(Src) 98.4 F (36.9 C) (Oral)  Resp 15  SpO2 97% Physical Exam  Nursing note and vitals reviewed. Constitutional: She is oriented to person, place, and time. She appears well-developed and well-nourished.  HENT:  Head: Normocephalic and atraumatic.  Eyes: EOM are normal. Pupils are equal, round, and reactive to light.  Neck: Normal range of motion. Neck supple.  Cardiovascular: Normal rate, normal heart sounds and intact distal pulses.   Pulmonary/Chest: Effort normal and breath sounds normal. She has no wheezes. She has no rales. She exhibits tenderness (reproduces symptoms).  Abdominal: Bowel sounds are normal. She exhibits no distension. There is no tenderness.  Musculoskeletal: Normal range of motion. She exhibits no edema and no tenderness.  Neurological: She is alert and oriented to person, place, and time. She has normal strength. No cranial  nerve deficit or sensory deficit.  Skin: Skin is warm and dry. No rash noted.  Psychiatric: She has a normal mood and affect.    ED Course  Procedures (including critical care time) Labs Review Labs Reviewed  CBC - Abnormal; Notable for the following:    HCT 35.8 (*)    All other components within normal limits  BASIC METABOLIC PANEL - Abnormal; Notable for the following:    Glucose, Bld 124 (*)    GFR calc non Af Amer 48 (*)    GFR calc Af Amer 55 (*)    All other components within normal limits  Rosezena SensorI-STAT TROPOININ, ED    Imaging Review Dg Chest 2 View  09/21/2013   CLINICAL DATA:  Cough  EXAM: CHEST - 2 VIEW  COMPARISON:  DG CHEST 2 VIEW dated 03/29/2013  FINDINGS: UPPER NORMAL HEART SIZE. CLEAR LUNGS. NO PNEUMOTHORAX OR PLEURAL EFFUSION. INCREASED AP DIAMETER OF THE CHEST WITH FLATTENING OF THE DIAPHRAGMS. CHRONIC CHANGES IN THE THORACIC SPINE.  IMPRESSION: No active cardiopulmonary disease.  Changes related to COPD.   Electronically Signed   By: Maryclare BeanArt  Hoss M.D.   On: 09/21/2013 18:36     EKG Interpretation   Date/Time:  Wednesday September 21 2013 17:30:07 EDT Ventricular Rate:  76 PR Interval:  190 QRS Duration: 142 QT Interval:  406 QTC Calculation: 456 R Axis:   -73 Text Interpretation:  Normal sinus rhythm with sinus arrhythmia Left axis  deviation Right bundle branch block Abnormal ECG No significant change  since last tracing Confirmed by [facility] CenterHELDON  MD, Leonette MostHARLES [id number]) on 09/21/2013  5:33:52 PM      MDM   Final diagnoses:  Bronchitis    Labs and imaging reviewed from triage are neg. I have a low suspicion that this is ACS but given patient's age, offered her admission for further testing. She does not want to stay in the hospital. I think her symptoms are MSK from her coughing and that she likely had a bronchitis. No signs of pneumonia on CXR. Normal WBC and no hypoxia. She has tussionex at home, but is almost out. Will treat with Zpak. Recommend close PCP followup and  to return to the ED if symptoms worsening.     Morrigan Wickens B. Bernette MayersSheldon, MD 09/21/13 2042

## 2013-09-21 NOTE — ED Notes (Signed)
MD at bedside. 

## 2013-09-21 NOTE — ED Notes (Signed)
Pt content with care, stable and comfortable, acknowledged understanding of instructions given.

## 2013-09-23 ENCOUNTER — Ambulatory Visit (INDEPENDENT_AMBULATORY_CARE_PROVIDER_SITE_OTHER): Payer: Commercial Managed Care - HMO | Admitting: Family

## 2013-09-23 VITALS — BP 132/76 | HR 58 | Temp 98.6°F | Resp 20 | Ht 65.0 in | Wt 180.1 lb

## 2013-09-23 DIAGNOSIS — E039 Hypothyroidism, unspecified: Secondary | ICD-10-CM

## 2013-09-23 DIAGNOSIS — J209 Acute bronchitis, unspecified: Secondary | ICD-10-CM

## 2013-09-23 MED ORDER — BENZONATATE 100 MG PO CAPS: 100.0000 mg | ORAL_CAPSULE | Freq: Three times a day (TID) | ORAL | Status: DC | PRN

## 2013-09-23 MED ORDER — ALBUTEROL SULFATE HFA 108 (90 BASE) MCG/ACT IN AERS
2.0000 | INHALATION_SPRAY | Freq: Four times a day (QID) | RESPIRATORY_TRACT | Status: DC | PRN
Start: 2013-09-23 — End: 2015-09-20

## 2013-09-23 MED ORDER — LISINOPRIL 10 MG PO TABS: 10.0000 mg | ORAL_TABLET | Freq: Every day | ORAL | Status: DC

## 2013-09-23 MED ORDER — ALBUTEROL SULFATE HFA 108 (90 BASE) MCG/ACT IN AERS: 2.0000 | INHALATION_SPRAY | Freq: Four times a day (QID) | RESPIRATORY_TRACT | Status: DC | PRN

## 2013-09-23 MED ORDER — LEVOTHYROXINE SODIUM 50 MCG PO TABS: 50.0000 ug | ORAL_TABLET | Freq: Every day | ORAL | Status: DC

## 2013-09-23 NOTE — Patient Instructions (Signed)
Please complete azithromycin. Continue tessalon as needed.  Start albuterol inhaler as needed for cough/wheezing. Call if symptoms worsen, if fever >100 or if not improved in 2-3 days. You may use tylenol as needed for rib pain. Follow up in 1 week.

## 2013-09-23 NOTE — Assessment & Plan Note (Addendum)
Symptoms most consistent with bronchitis. Advised pt and daughter-in-law who accompanies her today to have her complete zpak. Refill provided of tessalon. Did not fill tussionex due to hydrocodone allergy and cost.  Add albuterol for cough.  Tylenol as needed for musculoskeletal rib pain.  Follow up in 1 week, sooner if symptoms worsen or do not improve.

## 2013-09-23 NOTE — Progress Notes (Signed)
Subjective:    Patient ID: [name], female    DOB: 10-19-1920, [age] y.o.   MRN: [id number]  HPI  [name] is a 80 yr old female who presents today with chief complaint of cough.  She was seen in the ED on 4/22 with chief complaint of chest pain and cough.  ED records are reviewed. troponin was negative. cxr noted: No active cardiopulmonary disease. Changes related to COPD.  She was given rx for zpak.    Reports + chest pain with cough. Cough is described as dry.  She reports that she did not sleep well last night.  Daughter-in law  thought she felt warm yesterday. Notes pt has had some wheezing.  Pt reports + right sided rib pain with cough.    Review of Systems See HPI  Past Medical History  Diagnosis Date  . Arthritis   . Chicken pox   . Chronic kidney disease   . Colon polyps   . Hypertension   . Dementia   . Angina   . Dysrhythmia   . Heart murmur   . Shortness of breath   . Recurrent upper respiratory infection (URI)   . Pneumonia   . Hypothyroidism   . Anemia   . Headache(784.0)   . Anxiety   . Depression   . Valvular heart disease 09/04/2012  . Left leg pain 10/02/2012  . UTI (urinary tract infection) 12/30/2012  . Acute bronchitis 04/10/2013  . Dehydration 08/31/2013    History   Social History  . Marital Status: Widowed    Spouse Name: N/A    Number of Children: N/A  . Years of Education: N/A   Occupational History  . Not on file.   Social History Main Topics  . Smoking status: Former Smoker -- 0.20 packs/day for 1 years    Types: Cigarettes    Quit date: 06/02/1941  . Smokeless tobacco: Never Used  . Alcohol Use: No  . Drug Use: No  . Sexual Activity: Not Currently    Birth Control/ Protection: Abstinence   Other Topics Concern  . Not on file   Social History Narrative   Still lives at home and ambulatory daily. Doesn't use cane or walker, but has cane at home. Remote smoking history.     Past Surgical History  Procedure Laterality Date    . Appendectomy    . Cholecystectomy    . Kidney stones    . Tonsillectomy    . Abdominal hysterectomy    . Hand surgery    . Feet surgery    . Shoulder arthroscopy    . Back surgery    . Tonsillectomy    . Eye surgery      cataract removed and eye lids lifted  . Fracture surgery      bilateral arms  . Tubal ligation    . Colonoscopy w/ polypectomy      Family History  Problem Relation Age of Onset  . Heart disease Mother   . Heart disease Father   . Heart disease Other   . Birth defects Other     Allergies  Allergen Reactions  . Codeine Rash  . Hydrocodone Rash    Current Outpatient Prescriptions on File Prior to Visit  Medication Sig Dispense Refill  . azithromycin (ZITHROMAX) 250 MG tablet Take 1 tablet (250 mg total) by mouth daily.  4 tablet  0  . benzonatate (TESSALON) 100 MG capsule Take 100 mg by mouth 3 (three)  times daily as needed for cough.      . chlorpheniramine-HYDROcodone (TUSSIONEX PENNKINETIC ER) 10-8 MG/5ML LQCR Take 5 mLs by mouth every 12 (twelve) hours as needed for cough.  115 mL  0  . levothyroxine (SYNTHROID, LEVOTHROID) 50 MCG tablet Take 1 tablet (50 mcg total) by mouth daily.  30 tablet  3  . lisinopril (PRINIVIL,ZESTRIL) 10 MG tablet Take 1 tablet (10 mg total) by mouth daily.  30 tablet  3  . LORazepam (ATIVAN) 1 MG tablet Take 1 tablet (1 mg total) by mouth at bedtime as needed for sedation or sleep.  15 tablet  1  . Magnesium 250 MG TABS Take 250 mg by mouth daily.        No current facility-administered medications on file prior to visit.    BP 132/76  Pulse 58  Temp(Src) 98.6 F (37 C) (Oral)  Resp 20  Ht 5\' 5"  (1.651 m)  Wt 180 lb 1.3 oz (81.684 kg)  BMI 29.97 kg/m2  SpO2 97%       Objective:   Physical Exam  Constitutional: She is oriented to person, place, and time. She appears well-developed and well-nourished. No distress.  HENT:  Head: Normocephalic and atraumatic.  Right Ear: Tympanic membrane and ear canal  normal.  Left Ear: Tympanic membrane and ear canal normal.  Mouth/Throat: No oropharyngeal exudate, posterior oropharyngeal edema or posterior oropharyngeal erythema.  Cardiovascular: Normal rate and regular rhythm.   No murmur heard. Pulmonary/Chest: Effort normal and breath sounds normal. No respiratory distress. She has no wheezes. She has no rales. She exhibits no tenderness.  Neurological: She is alert and oriented to person, place, and time.  Psychiatric: She has a normal mood and affect. Her behavior is normal. Judgment and thought content normal.          Assessment & Plan:

## 2013-09-23 NOTE — Progress Notes (Signed)
Pre visit review using our clinic review tool, if applicable. No additional management support is needed unless otherwise documented below in the visit note. 

## 2013-09-24 LAB — TSH: TSH: 0.758 u[IU]/mL (ref 0.350–4.500)

## 2013-09-25 ENCOUNTER — Encounter: Payer: Self-pay | Admitting: Family

## 2013-09-28 ENCOUNTER — Ambulatory Visit: Payer: Commercial Managed Care - HMO | Admitting: Family

## 2013-09-29 ENCOUNTER — Telehealth: Payer: Self-pay | Admitting: Family Medicine

## 2013-09-29 NOTE — Telephone Encounter (Signed)
So this patient had requested to switch to Dr Jonny RuizJohn but they just have no capacity. Let her know they will need to wait for now, they will be getting a new doc in September. If it is OK with her I can continue til then. If she is feeling badly after her ER visits please find a time for me to see her. I could come in early if that helps

## 2013-09-29 NOTE — Telephone Encounter (Signed)
Pt wants to switch to Dr. Jonny RuizJohn from Dr. Abner GreenspanBlyth.  Will this be ok?

## 2013-09-29 NOTE — Telephone Encounter (Signed)
Unfortunately I am unable to accept at this time, due to the retirement of a MD in this office, having to see his patients, and the new MD not starting until sept 2015

## 2013-09-29 NOTE — Telephone Encounter (Signed)
FYI:  Patient informed and I offered patient an appt at 3:15 pm and she states she doesn't have transportation. Patient stated that she just isn't happy since Dr Rodena MedinHodgin left and to her it feels like there are a bunch of "teenage" providers. I explained to patient that the providers aren't teenagers and I was sorry she wasn't happy. Pt states she understands but she needed something closer to her house.

## 2013-10-01 ENCOUNTER — Inpatient Hospital Stay (HOSPITAL_COMMUNITY)
Admission: EM | Admit: 2013-10-01 | Discharge: 2013-10-04 | DRG: 689 | Disposition: A | Discharge status: Home Health | Payer: Medicare HMO | Attending: Internal Medicine | Admitting: Internal Medicine

## 2013-10-01 ENCOUNTER — Encounter (HOSPITAL_COMMUNITY): Payer: Self-pay | Admitting: Emergency Medicine

## 2013-10-01 ENCOUNTER — Emergency Department (HOSPITAL_COMMUNITY): Payer: Medicare HMO

## 2013-10-01 DIAGNOSIS — Z9849 Cataract extraction status, unspecified eye: Secondary | ICD-10-CM

## 2013-10-01 DIAGNOSIS — Z87891 Personal history of nicotine dependence: Secondary | ICD-10-CM

## 2013-10-01 DIAGNOSIS — F0390 Unspecified dementia without behavioral disturbance: Secondary | ICD-10-CM

## 2013-10-01 DIAGNOSIS — I10 Essential (primary) hypertension: Secondary | ICD-10-CM

## 2013-10-01 DIAGNOSIS — G9340 Encephalopathy, unspecified: Secondary | ICD-10-CM

## 2013-10-01 DIAGNOSIS — N189 Chronic kidney disease, unspecified: Secondary | ICD-10-CM

## 2013-10-01 DIAGNOSIS — E039 Hypothyroidism, unspecified: Secondary | ICD-10-CM

## 2013-10-01 DIAGNOSIS — I38 Endocarditis, valve unspecified: Secondary | ICD-10-CM

## 2013-10-01 DIAGNOSIS — F3289 Other specified depressive episodes: Secondary | ICD-10-CM | POA: Diagnosis present

## 2013-10-01 DIAGNOSIS — M129 Arthropathy, unspecified: Secondary | ICD-10-CM | POA: Diagnosis present

## 2013-10-01 DIAGNOSIS — R4182 Altered mental status, unspecified: Secondary | ICD-10-CM

## 2013-10-01 DIAGNOSIS — M79605 Pain in left leg: Secondary | ICD-10-CM

## 2013-10-01 DIAGNOSIS — R55 Syncope and collapse: Secondary | ICD-10-CM

## 2013-10-01 DIAGNOSIS — G2581 Restless legs syndrome: Secondary | ICD-10-CM

## 2013-10-01 DIAGNOSIS — H81399 Other peripheral vertigo, unspecified ear: Secondary | ICD-10-CM

## 2013-10-01 DIAGNOSIS — F411 Generalized anxiety disorder: Secondary | ICD-10-CM | POA: Diagnosis present

## 2013-10-01 DIAGNOSIS — N390 Urinary tract infection, site not specified: Principal | ICD-10-CM

## 2013-10-01 DIAGNOSIS — G4700 Insomnia, unspecified: Secondary | ICD-10-CM

## 2013-10-01 DIAGNOSIS — J209 Acute bronchitis, unspecified: Secondary | ICD-10-CM

## 2013-10-01 DIAGNOSIS — N182 Chronic kidney disease, stage 2 (mild): Secondary | ICD-10-CM | POA: Diagnosis present

## 2013-10-01 DIAGNOSIS — F329 Major depressive disorder, single episode, unspecified: Secondary | ICD-10-CM | POA: Diagnosis present

## 2013-10-01 DIAGNOSIS — I129 Hypertensive chronic kidney disease with stage 1 through stage 4 chronic kidney disease, or unspecified chronic kidney disease: Secondary | ICD-10-CM | POA: Diagnosis present

## 2013-10-01 DIAGNOSIS — Z79899 Other long term (current) drug therapy: Secondary | ICD-10-CM

## 2013-10-01 DIAGNOSIS — E860 Dehydration: Secondary | ICD-10-CM

## 2013-10-01 LAB — CBC WITH DIFFERENTIAL/PLATELET
BASOS ABS: 0 10*3/uL (ref 0.0–0.1)
BASOS PCT: 0 % (ref 0–1)
EOS PCT: 1 % (ref 0–5)
Eosinophils Absolute: 0.1 10*3/uL (ref 0.0–0.7)
HEMATOCRIT: 34.4 % — AB (ref 36.0–46.0)
Hemoglobin: 12.2 g/dL (ref 12.0–15.0)
Lymphocytes Relative: 25 % (ref 12–46)
Lymphs Abs: 2.1 10*3/uL (ref 0.7–4.0)
MCH: 30.1 pg (ref 26.0–34.0)
MCHC: 35.5 g/dL (ref 30.0–36.0)
MCV: 84.9 fL (ref 78.0–100.0)
MONO ABS: 0.4 10*3/uL (ref 0.1–1.0)
Monocytes Relative: 5 % (ref 3–12)
Neutro Abs: 5.7 10*3/uL (ref 1.7–7.7)
Neutrophils Relative %: 69 % (ref 43–77)
Platelets: 251 10*3/uL (ref 150–400)
RBC: 4.05 MIL/uL (ref 3.87–5.11)
RDW: 12.7 % (ref 11.5–15.5)
WBC: 8.3 10*3/uL (ref 4.0–10.5)

## 2013-10-01 LAB — COMPREHENSIVE METABOLIC PANEL
ALBUMIN: 3.5 g/dL (ref 3.5–5.2)
ALT: 40 U/L — ABNORMAL HIGH (ref 0–35)
AST: 35 U/L (ref 0–37)
Alkaline Phosphatase: 77 U/L (ref 39–117)
BUN: 13 mg/dL (ref 6–23)
CO2: 20 mEq/L (ref 19–32)
CREATININE: 1.08 mg/dL (ref 0.50–1.10)
Calcium: 9.3 mg/dL (ref 8.4–10.5)
Chloride: 107 mEq/L (ref 96–112)
GFR calc Af Amer: 50 mL/min — ABNORMAL LOW (ref >90)
GFR calc non Af Amer: 44 mL/min — ABNORMAL LOW (ref >90)
Glucose, Bld: 110 mg/dL — ABNORMAL HIGH (ref 70–99)
Potassium: 3.7 mEq/L (ref 3.7–5.3)
Sodium: 144 mEq/L (ref 137–147)
Total Bilirubin: 0.7 mg/dL (ref 0.3–1.2)
Total Protein: 7 g/dL (ref 6.0–8.3)

## 2013-10-01 LAB — I-STAT VENOUS BLOOD GAS, ED
ACID-BASE DEFICIT: 1 mmol/L (ref 0.0–2.0)
Bicarbonate: 21.6 mEq/L (ref 20.0–24.0)
O2 Saturation: 77 %
PO2 VEN: 39 mmHg (ref 30.0–45.0)
TCO2: 23 mmol/L (ref 0–100)
pCO2, Ven: 30.7 mmHg — ABNORMAL LOW (ref 45.0–50.0)
pH, Ven: 7.456 — ABNORMAL HIGH (ref 7.250–7.300)

## 2013-10-01 LAB — CBG MONITORING, ED: Glucose-Capillary: 110 mg/dL — ABNORMAL HIGH (ref 70–99)

## 2013-10-01 LAB — PROTIME-INR
INR: 0.95 (ref 0.00–1.49)
PROTHROMBIN TIME: 12.5 s (ref 11.6–15.2)

## 2013-10-01 LAB — AMMONIA: AMMONIA: 26 umol/L (ref 11–60)

## 2013-10-01 LAB — APTT: aPTT: 27 seconds (ref 24–37)

## 2013-10-01 LAB — ETHANOL: Alcohol, Ethyl (B): <11 mg/dL (ref 0–11)

## 2013-10-01 MED ORDER — SODIUM CHLORIDE 0.9 % IV SOLN
1000.0000 mL | INTRAVENOUS | Status: DC
  Administered 2013-10-01 – 2013-10-02 (×2): 1000 mL via INTRAVENOUS

## 2013-10-01 NOTE — ED Notes (Signed)
Dr Lynelle DoctorKnapp given a copy of venous blood gas results

## 2013-10-01 NOTE — ED Notes (Signed)
Patient transported to CT 

## 2013-10-01 NOTE — ED Notes (Signed)
Darryl-phlebotomy at bedside.

## 2013-10-01 NOTE — ED Provider Notes (Signed)
CSN: [id number]     Arrival date & time 10/01/13  2050 History   First MD Initiated Contact with Patient 10/01/13 2109     Chief Complaint  Patient presents with  . Altered Mental Status   Level V caveat: Altered mental status HPI Patient presents to the emergency room with altered mental status. Patient lives in an independent living facility. Neighbors called out because the patient was moaning and crying. The patient does have a history of dementia but this behavior is very unusual for her. Patient is unable to provide any history for me. She appears to be grunting and is uncomfortable but cannot tell me why she is here in the emergency department. I asked her if she is hurting she states I don't know.  Patient will intermittently not respond to questions and then suddenly call out but she does not know where she is and does not know how she got here. Past Medical History  Diagnosis Date  . Arthritis   . Chicken pox   . Chronic kidney disease   . Colon polyps   . Hypertension   . Dementia   . Angina   . Dysrhythmia   . Heart murmur   . Shortness of breath   . Recurrent upper respiratory infection (URI)   . Pneumonia   . Hypothyroidism   . Anemia   . Headache(784.0)   . Anxiety   . Depression   . Valvular heart disease 09/04/2012  . Left leg pain 10/02/2012  . UTI (urinary tract infection) 12/30/2012  . Acute bronchitis 04/10/2013  . Dehydration 08/31/2013   Past Surgical History  Procedure Laterality Date  . Appendectomy    . Cholecystectomy    . Kidney stones    . Tonsillectomy    . Abdominal hysterectomy    . Hand surgery    . Feet surgery    . Shoulder arthroscopy    . Back surgery    . Tonsillectomy    . Eye surgery      cataract removed and eye lids lifted  . Fracture surgery      bilateral arms  . Tubal ligation    . Colonoscopy w/ polypectomy     Family History  Problem Relation Age of Onset  . Heart disease Mother   . Heart disease Father   . Heart disease  Other   . Birth defects Other    History  Substance Use Topics  . Smoking status: Former Smoker -- 0.20 packs/day for 1 years    Types: Cigarettes    Quit date: 06/02/1941  . Smokeless tobacco: Never Used  . Alcohol Use: No   OB History   Grav Para Term Preterm Abortions TAB SAB Ect Mult Living                 Review of Systems  Unable to perform ROS: Mental status change      Allergies  Codeine and Hydrocodone  Home Medications   Prior to Admission medications   Medication Sig Start Date End Date Taking? Authorizing Provider  albuterol (PROVENTIL HFA;VENTOLIN HFA) 108 (90 BASE) MCG/ACT inhaler Inhale 2 puffs into the lungs every 6 (six) hours as needed for wheezing or shortness of breath. 09/23/13   Sandford CrazeMelissa O'Sullivan, NP  azithromycin (ZITHROMAX) 250 MG tablet Take 1 tablet (250 mg total) by mouth daily. 09/21/13   [name] B. Bernette MayersSheldon, MD  benzonatate (TESSALON) 100 MG capsule Take 1 capsule (100 mg total) by mouth 3 (three) times  daily as needed for cough. 09/23/13   Sandford Craze, NP  chlorpheniramine-HYDROcodone (TUSSIONEX PENNKINETIC ER) 10-8 MG/5ML LQCR Take 5 mLs by mouth every 12 (twelve) hours as needed for cough. 09/21/13   [name] B. Bernette Mayers, MD  levothyroxine (SYNTHROID, LEVOTHROID) 50 MCG tablet Take 1 tablet (50 mcg total) by mouth daily. 09/23/13   Sandford Craze, NP  lisinopril (PRINIVIL,ZESTRIL) 10 MG tablet Take 1 tablet (10 mg total) by mouth daily. 09/23/13   Sandford Craze, NP  LORazepam (ATIVAN) 1 MG tablet Take 1 tablet (1 mg total) by mouth at bedtime as needed for sedation or sleep. 08/30/13   Bradd Canary, MD  Magnesium 250 MG TABS Take 250 mg by mouth daily.     Historical Provider, MD   BP 138/58  Pulse 60  Temp(Src) 98.7 F (37.1 C) (Oral)  Resp 20  SpO2 98% Physical Exam  Nursing note and vitals reviewed. Constitutional: She appears listless. She appears distressed.  Intermittently moaning and speaking  HENT:  Head: Normocephalic  and atraumatic.  Right Ear: External ear normal.  Left Ear: External ear normal.  Mouth/Throat: No oropharyngeal exudate.  Eyes: Conjunctivae are normal. Right eye exhibits no discharge. Left eye exhibits no discharge. No scleral icterus.  Neck: Neck supple. No tracheal deviation present.  Cardiovascular: Normal rate, regular rhythm and intact distal pulses.   Pulmonary/Chest: Effort normal and breath sounds normal. No stridor. No respiratory distress. She has no wheezes. She has no rales.  Abdominal: Soft. Bowel sounds are normal. She exhibits no distension. There is no tenderness. There is no rebound and no guarding.  Musculoskeletal: She exhibits no edema and no tenderness.  Neurological: She appears listless. She is disoriented. No cranial nerve deficit (no facial droop, extraocular movements intact, no slurred speech) or sensory deficit. She exhibits normal muscle tone. She displays no seizure activity.  Patient does move both hands but will not lift her legs off the bed, she was able to squeeze the nurse's hand but does not follow commands with me  Skin: Skin is warm and dry. No rash noted.  Psychiatric: She has a normal mood and affect.    ED Course  Procedures (including critical care time) Labs Review Labs Reviewed  CBC WITH DIFFERENTIAL - Abnormal; Notable for the following:    HCT 34.4 (*)    All other components within normal limits  COMPREHENSIVE METABOLIC PANEL - Abnormal; Notable for the following:    Glucose, Bld 110 (*)    ALT 40 (*)    GFR calc non Af Amer 44 (*)    GFR calc Af Amer 50 (*)    All other components within normal limits  CBG MONITORING, ED - Abnormal; Notable for the following:    Glucose-Capillary 110 (*)    All other components within normal limits  I-STAT VENOUS BLOOD GAS, ED - Abnormal; Notable for the following:    pH, Ven 7.456 (*)    pCO2, Ven 30.7 (*)    All other components within normal limits  APTT  PROTIME-INR  AMMONIA  ETHANOL   URINALYSIS, ROUTINE W REFLEX MICROSCOPIC    Imaging Review Dg Chest 1 View  10/01/2013   CLINICAL DATA:  Altered mental status.  EXAM: CHEST - 1 VIEW  COMPARISON:  09/21/2013  FINDINGS: Slightly shallow inspiration. Normal heart size and pulmonary vascularity. No focal airspace disease or consolidation in the lungs. Degenerative changes in the spine and shoulders with postoperative change in the right shoulder. No significant change since previous  study.  IMPRESSION: No active disease.   Electronically Signed   By: Burman NievesWilliam  Stevens M.D.   On: 10/01/2013 22:55   Ct Head Wo Contrast  10/01/2013   CLINICAL DATA:  Altered mental status.  EXAM: CT HEAD WITHOUT CONTRAST  TECHNIQUE: Contiguous axial images were obtained from the base of the skull through the vertex without intravenous contrast.  COMPARISON:  CT HEAD W/O CM dated 08/04/2013  FINDINGS: Diffuse cerebral atrophy. Mild ventricular dilatation consistent with central atrophy. Low-attenuation changes in the deep white matter consistent with small vessel ischemia. No significant changes since prior study. No mass effect or midline shift. No abnormal extra-axial fluid collections. Gray-white matter junctions are distinct. Basal cisterns are not effaced. No evidence of acute intracranial hemorrhage. No depressed skull fractures. Mucosal thickening in the paranasal sinuses. Mastoid air cells are not opacified.  IMPRESSION: No acute intracranial abnormalities. Chronic atrophy and small vessel ischemic changes. Presumed inflammatory changes in the paranasal sinuses.   Electronically Signed   By: Burman NievesWilliam  Stevens M.D.   On: 10/01/2013 21:58    EKG Sinus bradycardia, rate 56, with premature atrial contractions Right bundle branch block Left anterior fascicular block Number 2G for comparison MDM   Final diagnoses:  Altered mental status    Pt mental status has improved somewhat .  She is not constantly moaning.  Still not at her baseline.  ?TIA.  Will  consult with medical service regarding admission and observation.    Celene KrasJon R Joanell Cressler, MD 10/01/13 [phone]

## 2013-10-01 NOTE — ED Notes (Signed)
Kim (grand-daughter) at bedside. sts she saw her this morning and patient was alert, able to answer questions appropriately. C/o weakness and coughing but not like present.

## 2013-10-01 NOTE — ED Notes (Signed)
Patient lethargic with eyes closed and deep respirations- with having intermittent episodes of alertness with confusion sts "where am I? I need to get off the floor and back into my home" RN re-oriented- patient unable to recall what RN just said. Patient denies pain. Patient able to grip RNs hands with strong equal grips with continued requesting from RN. Pt unable to follow other commands.

## 2013-10-01 NOTE — ED Notes (Signed)
Per EMS pt from [facility], neighbors called out due to patient crying out hysterically unable to stay still. Patient has hx of dementia. EMS sts per neighbors patients symptoms appear to be sundowners- neighbors sts pt wanders and knocks on down  appx 7pm every night. Patient has steady gait, no facial droop, equal hand grips. Unable to follow all commands. Patient lethargic, grunting.

## 2013-10-02 DIAGNOSIS — N189 Chronic kidney disease, unspecified: Secondary | ICD-10-CM

## 2013-10-02 DIAGNOSIS — F0390 Unspecified dementia without behavioral disturbance: Secondary | ICD-10-CM

## 2013-10-02 DIAGNOSIS — E039 Hypothyroidism, unspecified: Secondary | ICD-10-CM

## 2013-10-02 DIAGNOSIS — I10 Essential (primary) hypertension: Secondary | ICD-10-CM

## 2013-10-02 DIAGNOSIS — R4182 Altered mental status, unspecified: Secondary | ICD-10-CM

## 2013-10-02 DIAGNOSIS — G9340 Encephalopathy, unspecified: Secondary | ICD-10-CM

## 2013-10-02 LAB — URINALYSIS, ROUTINE W REFLEX MICROSCOPIC
Bilirubin Urine: NEGATIVE
Glucose, UA: NEGATIVE mg/dL
Hgb urine dipstick: NEGATIVE
Ketones, ur: NEGATIVE mg/dL
Nitrite: NEGATIVE
PH: 5.5 (ref 5.0–8.0)
Protein, ur: NEGATIVE mg/dL
SPECIFIC GRAVITY, URINE: 1.016 (ref 1.005–1.030)
UROBILINOGEN UA: 0.2 mg/dL (ref 0.0–1.0)

## 2013-10-02 LAB — GLUCOSE, CAPILLARY: GLUCOSE-CAPILLARY: 101 mg/dL — AB (ref 70–99)

## 2013-10-02 LAB — URINE MICROSCOPIC-ADD ON

## 2013-10-02 LAB — LIPID PANEL
Cholesterol: 155 mg/dL (ref 0–200)
HDL: 33 mg/dL — ABNORMAL LOW (ref >39)
LDL Cholesterol: 86 mg/dL (ref 0–99)
Total CHOL/HDL Ratio: 4.7 RATIO
Triglycerides: 181 mg/dL — ABNORMAL HIGH (ref <150)
VLDL: 36 mg/dL (ref 0–40)

## 2013-10-02 LAB — RAPID URINE DRUG SCREEN, HOSP PERFORMED
Amphetamines: NOT DETECTED
BARBITURATES: NOT DETECTED
BENZODIAZEPINES: NOT DETECTED
COCAINE: NOT DETECTED
OPIATES: NOT DETECTED
Tetrahydrocannabinol: NOT DETECTED

## 2013-10-02 MED ORDER — ALBUTEROL SULFATE (2.5 MG/3ML) 0.083% IN NEBU
2.5000 mg | INHALATION_SOLUTION | Freq: Four times a day (QID) | RESPIRATORY_TRACT | Status: DC | PRN
Start: 2013-10-02 — End: 2013-10-04

## 2013-10-02 MED ORDER — LISINOPRIL 10 MG PO TABS
10.0000 mg | ORAL_TABLET | Freq: Every day | ORAL | Status: DC
  Administered 2013-10-02 – 2013-10-04 (×3): 10 mg via ORAL
  Filled 2013-10-02 (×3): qty 1

## 2013-10-02 MED ORDER — BENZONATATE 100 MG PO CAPS
200.0000 mg | ORAL_CAPSULE | Freq: Three times a day (TID) | ORAL | Status: DC
  Administered 2013-10-02 – 2013-10-04 (×8): 200 mg via ORAL
  Filled 2013-10-02 (×10): qty 2

## 2013-10-02 MED ORDER — GUAIFENESIN-DM 100-10 MG/5ML PO SYRP
5.0000 mL | ORAL_SOLUTION | ORAL | Status: DC | PRN
  Administered 2013-10-02 (×2): 5 mL via ORAL
  Filled 2013-10-02 (×2): qty 5

## 2013-10-02 MED ORDER — HYDROMORPHONE HCL PF 1 MG/ML IJ SOLN: 0.5000 mg | INTRAMUSCULAR | Status: DC | PRN

## 2013-10-02 MED ORDER — LEVOTHYROXINE SODIUM 50 MCG PO TABS
50.0000 ug | ORAL_TABLET | Freq: Every day | ORAL | Status: DC
  Administered 2013-10-02 – 2013-10-04 (×3): 50 ug via ORAL
  Filled 2013-10-02 (×4): qty 1

## 2013-10-02 MED ORDER — WHITE PETROLATUM GEL
Status: AC
  Administered 2013-10-02: 12:00:00
  Filled 2013-10-02: qty 5

## 2013-10-02 MED ORDER — MAGNESIUM OXIDE 400 (241.3 MG) MG PO TABS
400.0000 mg | ORAL_TABLET | Freq: Every day | ORAL | Status: DC
  Administered 2013-10-02 – 2013-10-04 (×3): 400 mg via ORAL
  Filled 2013-10-02 (×3): qty 1

## 2013-10-02 MED ORDER — ENOXAPARIN SODIUM 30 MG/0.3ML ~~LOC~~ SOLN
30.0000 mg | Freq: Every day | SUBCUTANEOUS | Status: DC
  Administered 2013-10-02 – 2013-10-04 (×3): 30 mg via SUBCUTANEOUS
  Filled 2013-10-02 (×3): qty 0.3

## 2013-10-02 NOTE — Progress Notes (Signed)
[facility] PROGRESS NOTE  Deborah CorpusStella P Debarge [name]:[id number] DOB: 1921/01/31 DOA: 10/01/2013 PCP: [name], [name], MD I have seen and examined pt who is a 80 year old admitted this am by Dr Lovell SheehanJenkins with history of hypertension, dementia, hypothyroidism, recently treated with Zithromax for bronchitis presenting with confusion and was hallucinating>> now resolved for work up of CVA. In followup today she is alert oriented/appropriate, son at bedside. She denies any weakness or dizziness. Will Continue current management as per Dr. Lovell SheehanJenkins and followup on bending MRI echo and carotid Dopplers.      Kela MillinAdeline C Caily Rakers  [facility] Pager [phone]. If 7PM-7AM, please contact night-coverage at [email or web address], password [email or web address] 10/02/2013, 9:38 AM  LOS: 1 day

## 2013-10-02 NOTE — Progress Notes (Signed)
Called MD regarding patient's cough: it is pretty constant, nonproductive, and the patient says she's had it "for about a month, and sometimes i cough so hard and long that i vomit." Orders received.

## 2013-10-02 NOTE — H&P (Signed)
[facility] History and Physical  [name]:[id number] DOB: 1921/03/04 DOA: 10/01/2013  Referring physician: EDP PCP: Danise Edge, MD  Specialists:   Chief Complaint:   HPI: [name] is a 80 y.o. female who resides in an [facility] who was sent to the ED for evalauation after she was reported as being confused and hallucinating.   Her grand daughter is at the Bedside and reports that she had called to reach her grandmother by telephone and there was no answer, so she had a neighbor check on her and found her confused at the time of around 8:30 pm.   So the granddaughter went over and her grandmother was not able to recognize her and did not know who she was either.   She was sent to the ED, and evaluated and while in the ED her symptoms resolved and she was back to her baseline after 4 hours.   A ct scan of the head was performed and found to be negative for acute findings.   She was referred for medical admission for a TIA workup.   Of Note, she was treated for bronchitis 2 weeks ago and took Azithromycin.       Review of Systems:  Constitutional: No Weight Loss, No Weight Gain, Night Sweats, Fevers, Chills, Fatigue, or Generalized Weakness HEENT: No Headaches, Difficulty Swallowing,Tooth/Dental Problems,Sore Throat,  No Sneezing, Rhinitis, Ear Ache, Nasal Congestion, or Post Nasal Drip,  Cardio-vascular:  No Chest pain, Orthopnea, PND, Edema in lower extremities, Anasarca, Dizziness, Palpitations  Resp: No Dyspnea, No DOE, +Cough, No Non-Productive Cough, No Hemoptysis, No Change in Color of Mucus, No Wheezing.    GI: No Heartburn, Indigestion, Abdominal Pain, Nausea, Vomiting, Diarrhea, Change in Bowel Habits,  Loss of Appetite  GU: No Dysuria, Change in Color of Urine, No Urgency or Frequency, No Flank pain.  Musculoskeletal: No Joint Pain or Swelling.  No Decreased Range of Motion. No Back Pain.  Neurologic: No Syncope, No Seizures, Muscle Weakness,  Paresthesia, Vision Disturbance or Loss, No Diplopia, No Vertigo, No Difficulty Walking,  Skin: No Rash or Lesions. Psych: No Change in Mood or Affect. No Depression or Anxiety. No Memory loss. +Confusion, +Hallucinations   Past Medical History  Diagnosis Date  . Arthritis   . Chicken pox   . Chronic kidney disease   . Colon polyps   . Hypertension   . Dementia   . Angina   . Dysrhythmia   . Heart murmur   . Shortness of breath   . Recurrent upper respiratory infection (URI)   . Pneumonia   . Hypothyroidism   . Anemia   . Headache(784.0)   . Anxiety   . Depression   . Valvular heart disease 09/04/2012  . Left leg pain 10/02/2012  . UTI (urinary tract infection) 12/30/2012  . Acute bronchitis 04/10/2013  . Dehydration 08/31/2013      Past Surgical History  Procedure Laterality Date  . Appendectomy    . Cholecystectomy    . Kidney stones    . Tonsillectomy    . Abdominal hysterectomy    . Hand surgery    . Feet surgery    . Shoulder arthroscopy    . Back surgery    . Tonsillectomy    . Eye surgery      cataract removed and eye lids lifted  . Fracture surgery      bilateral arms  . Tubal ligation    . Colonoscopy w/ polypectomy  Prior to Admission medications   Medication Sig Start Date End Date Taking? Authorizing Provider  acetaminophen (TYLENOL) 325 MG tablet Take 650 mg by mouth every 6 (six) hours as needed for moderate pain.   Yes Historical Provider, MD  albuterol (PROVENTIL HFA;VENTOLIN HFA) 108 (90 BASE) MCG/ACT inhaler Inhale 2 puffs into the lungs every 6 (six) hours as needed for wheezing or shortness of breath. 09/23/13  Yes Sandford Craze, NP  benzonatate (TESSALON) 100 MG capsule Take 1 capsule (100 mg total) by mouth 3 (three) times daily as needed for cough. 09/23/13  Yes Sandford Craze, NP  levothyroxine (SYNTHROID, LEVOTHROID) 50 MCG tablet Take 1 tablet (50 mcg total) by mouth daily. 09/23/13  Yes Sandford Craze, NP  lisinopril  (PRINIVIL,ZESTRIL) 10 MG tablet Take 1 tablet (10 mg total) by mouth daily. 09/23/13  Yes Sandford Craze, NP  LORazepam (ATIVAN) 1 MG tablet Take 0.5 mg by mouth every 8 (eight) hours as needed for anxiety.   Yes Historical Provider, MD  Magnesium 250 MG TABS Take 250 mg by mouth daily.    Yes Historical Provider, MD  azithromycin (ZITHROMAX) 250 MG tablet Take 1 tablet (250 mg total) by mouth daily. 09/21/13   [name] B. Bernette Mayers, MD      Allergies  Allergen Reactions  . Codeine Rash  . Hydrocodone Rash     Social History:  reports that she quit smoking about 72 years ago. Her smoking use included Cigarettes. She has a .2 pack-year smoking history. She has never used smokeless tobacco. She reports that she does not drink alcohol or use illicit drugs.     Family History  Problem Relation Age of Onset  . Heart disease Mother   . Heart disease Father   . Heart disease Other   . Birth defects Other        Physical Exam:  GEN:  Pleasant Obese Elderly  80 y.o. Caucasian female examined and in no acute distress; cooperative with exam Filed Vitals:   10/01/13 2330 10/02/13 0000 10/02/13 0030 10/02/13 0040  BP: 147/55 138/55 134/57   Pulse: 57 60 57   Temp:    98.1 F (36.7 C)  TempSrc:      Resp: 13 13 13    SpO2: 98% 98% 96%    Blood pressure 134/57, pulse 57, temperature 98.1 F (36.7 C), temperature source Oral, resp. rate 13, SpO2 96.00%. PSYCH: She is alert and oriented x3; does not appear anxious does not appear depressed; affect is normal HEENT: Normocephalic and Atraumatic, Mucous membranes pink; PERRLA; EOM intact; Fundi:  Benign;  No scleral icterus, Nares: Patent, Oropharynx: Clear, Edentulous with Dentures, Neck:  FROM, no cervical lymphadenopathy nor thyromegaly or carotid bruit; no JVD; Breasts:: Not examined CHEST WALL: No tenderness CHEST: Normal respiration, clear to auscultation bilaterally HEART: Regular rate and rhythm;  BACK: No kyphosis or scoliosis; no  CVA tenderness ABDOMEN: Positive Bowel Sounds, Obese, soft non-tender; no masses, no organomegaly, no pannus; no intertriginous candida. Rectal Exam: Not done EXTREMITIES: No cyanosis, clubbing or edema; no ulcerations. Genitalia: not examined PULSES: 2+ and symmetric SKIN: Normal hydration no rash or ulceration CNS:   Mental Status:  Alert, oriented, thought content appropriate. Speech fluent without evidence of aphasia. Able to follow 3 step commands without difficulty. In No obvious pain.  Cranial Nerves:  II: Discs flat bilaterally; Visual fields Intact, Pupils equal and reactive.  III,IV, VI:  extra-ocular motions intact bilaterally  V,VII: smile symmetric, facial light touch sensation normal bilaterally  VIII:  hearing mildly decreased bilaterally  IX,X: gag reflex present  XI: bilateral shoulder shrug  XII: midline tongue extension  Motor:  Right : Upper extremity 5/5   Left: Upper extremity 5/5  Lower extremity 5/5   Lower extremity 5/5  Tone and bulk:normal tone throughout; no atrophy noted  Sensory: Pinprick and light touch intact throughout, bilaterally  Deep Tendon Reflexes: 2+ and symmetric throughout  Plantars/ Babinski: Right: normal Left: normal   Cerebellar: Finger to nose with or without difficulty.  Gait: deferred  Vascular: pulses palpable throughout    Labs on Admission:  Basic Metabolic Panel:  Recent Labs Lab 10/01/13 2118  NA 144  K 3.7  CL 107  CO2 20  GLUCOSE 110*  BUN 13  CREATININE 1.08  CALCIUM 9.3   Liver Function Tests:  Recent Labs Lab 10/01/13 2118  AST 35  ALT 40*  ALKPHOS 77  BILITOT 0.7  PROT 7.0  ALBUMIN 3.5   No results found for this basename: LIPASE, AMYLASE,  in the last 168 hours  Recent Labs Lab 10/01/13 2133  AMMONIA 26   CBC:  Recent Labs Lab 10/01/13 2118  WBC 8.3  NEUTROABS 5.7  HGB 12.2  HCT 34.4*  MCV 84.9  PLT 251   Cardiac Enzymes: No results found for this basename: CKTOTAL, CKMB,  CKMBINDEX, TROPONINI,  in the last 168 hours  BNP (last 3 results) No results found for this basename: PROBNP,  in the last 8760 hours CBG:  Recent Labs Lab 10/01/13 2112  GLUCAP 110*    Radiological Exams on Admission: Dg Chest 1 View  10/01/2013   CLINICAL DATA:  Altered mental status.  EXAM: CHEST - 1 VIEW  COMPARISON:  09/21/2013  FINDINGS: Slightly shallow inspiration. Normal heart size and pulmonary vascularity. No focal airspace disease or consolidation in the lungs. Degenerative changes in the spine and shoulders with postoperative change in the right shoulder. No significant change since previous study.  IMPRESSION: No active disease.   Electronically Signed   By: Burman Nieves M.D.   On: 10/01/2013 22:55   Ct Head Wo Contrast  10/01/2013   CLINICAL DATA:  Altered mental status.  EXAM: CT HEAD WITHOUT CONTRAST  TECHNIQUE: Contiguous axial images were obtained from the base of the skull through the vertex without intravenous contrast.  COMPARISON:  CT HEAD W/O CM dated 08/04/2013  FINDINGS: Diffuse cerebral atrophy. Mild ventricular dilatation consistent with central atrophy. Low-attenuation changes in the deep white matter consistent with small vessel ischemia. No significant changes since prior study. No mass effect or midline shift. No abnormal extra-axial fluid collections. Gray-white matter junctions are distinct. Basal cisterns are not effaced. No evidence of acute intracranial hemorrhage. No depressed skull fractures. Mucosal thickening in the paranasal sinuses. Mastoid air cells are not opacified.  IMPRESSION: No acute intracranial abnormalities. Chronic atrophy and small vessel ischemic changes. Presumed inflammatory changes in the paranasal sinuses.   Electronically Signed   By: Burman Nieves M.D.   On: 10/01/2013 21:58      Assessment/Plan:   80 y.o. female with  Principal Problem:   Acute encephalopathy Active Problems:   Hypertension   Hypothyroidism   Dementia    Chronic kidney disease     1.   Acute Encephalopathy-   Symptoms resolved while in Ed, So evaluating for a possible TIA.  TIA workup initiated, Neuro checks, and MRI/MRA in AM, 2D ECHO and Carotid US ordered and Fasting Lipids.     2.   HTN-  Continue Lisinopril Rx, monitor BPs.    3.   Hypothyroidism-  TSH checked on 04/23 and was 0.7,   Continue Levothyroxine Rx.     4.   CKD-  Stage II-    GFR= 50%,  Monitor BUN/Cr levels.     5.   Dementia-  Mild, #1 is an acute change.     6.   DVT prophylaxis with Lovenox.      Code Status:      FULL CODE Family Communication:   Grand Daughter at Bedside Disposition Plan:    Inpatient  Time spent:  76 Minutes  Tyreanna Bisesi Velora Heckler [facility] Pager [phone]  If 7PM-7AM, please contact night-coverage [email or web address] Password [email or web address] 10/02/2013, 12:42 AM

## 2013-10-02 NOTE — Progress Notes (Signed)
VASCULAR LAB PRELIMINARY  PRELIMINARY  PRELIMINARY  PRELIMINARY  Carotid Dopplers completed.    Preliminary report:  1-39% ICA stenosis.  Vertebral artery flow is antegrade.  Kern AlbertaCandace R Kenzy Horton, RVT 10/02/2013, 10:54 AM

## 2013-10-02 NOTE — Progress Notes (Signed)
Patient admitted around 0100 was able to move by herself from stretcher to bed, she did not show any signs of confusion, her NIH was 0 and her vital signs were stable.  I put telemetry on her and completed her admission documentation. She complained about cold feet and cramps, I gave her extra blankets and instructed her on breathing and the cramps went away. She is resting comfortably and I will continue to monitor

## 2013-10-02 NOTE — Progress Notes (Signed)
Received report from [place] RN at 0105. Room ready and awaiting arrival of pt to unit. Giving report to RN Viviann SpareSteven who will be taking care of pt on arrival.

## 2013-10-03 ENCOUNTER — Observation Stay (HOSPITAL_COMMUNITY): Payer: Medicare HMO

## 2013-10-03 DIAGNOSIS — J209 Acute bronchitis, unspecified: Secondary | ICD-10-CM

## 2013-10-03 DIAGNOSIS — N390 Urinary tract infection, site not specified: Principal | ICD-10-CM

## 2013-10-03 DIAGNOSIS — I359 Nonrheumatic aortic valve disorder, unspecified: Secondary | ICD-10-CM

## 2013-10-03 LAB — GLUCOSE, CAPILLARY: Glucose-Capillary: 123 mg/dL — ABNORMAL HIGH (ref 70–99)

## 2013-10-03 LAB — HEMOGLOBIN A1C
Hgb A1c MFr Bld: 6.2 % — ABNORMAL HIGH (ref <5.7)
Mean Plasma Glucose: 131 mg/dL — ABNORMAL HIGH (ref <117)

## 2013-10-03 MED ORDER — DEXTROSE 5 % IV SOLN
1.0000 g | Freq: Every day | INTRAVENOUS | Status: DC
  Administered 2013-10-03 – 2013-10-04 (×2): 1 g via INTRAVENOUS
  Filled 2013-10-03 (×2): qty 10

## 2013-10-03 NOTE — Progress Notes (Signed)
[facility] PROGRESS NOTE  IDALIS STCYR NWG:[id number] DOB: [birth year] DOA: 10/01/2013 PCP: Danise Edge, MD  Assessment/Plan: 1. Acute Encephalopathy - Symptoms resolved while in Ed, MRI negative for infarct and carotid Dopplers with no significant ICA stenosis.  -Await 2-D echo which is still pending at this time -UTI possible contributing factor 2. HTN - Continue Lisinopril, follow 3. Hypothyroidism - TSH checked on 04/23 and was 0.7, Continue Levothyroxine  4. CKD- Stage II- GFR= 50%, - follow recheck the medicine a.m. 5. Dementia- -Mild, patient now at baseline mentation  6. DVT prophylaxis with Lovenox.  7. Probable UTI -Start Rocephin, urine cultures ordered follow.     Code Status: Full Family Communication: With son at bedside on 5/3 Disposition Plan: To home when medically ready   Consultants:  None  Procedures:  Echocardiogram pending  Antibiotics:  Rocephin started today 5/4  HPI/Subjective: Doing well today alert and oriented x3 . Denies any complaints.  Objective: Filed Vitals:   10/03/13 1445  BP: 153/58  Pulse: 56  Temp: 97.8 F (36.6 C)  Resp: 18    Intake/Output Summary (Last 24 hours) at 10/03/13 1605 Last data filed at 10/02/13 1700  Gross per 24 hour  Intake    300 ml  Output      0 ml  Net    300 ml   Filed Weights   10/02/13 0200  Weight: 82.2 kg (181 lb 3.5 oz)    Exam:  General: alert & oriented x 3 In NAD Cardiovascular: RRR, nl S1 s2 Respiratory: CTAB Abdomen: soft +BS NT/ND, no masses palpable Extremities: No cyanosis and no edema     Data Reviewed: Basic Metabolic Panel:  Recent Labs Lab 10/01/13 2118  NA 144  K 3.7  CL 107  CO2 20  GLUCOSE 110*  BUN 13  CREATININE 1.08  CALCIUM 9.3   Liver Function Tests:  Recent Labs Lab 10/01/13 2118  AST 35  ALT 40*  ALKPHOS 77  BILITOT 0.7  PROT 7.0  ALBUMIN 3.5   No results found for this basename: LIPASE, AMYLASE,  in the last 168  hours  Recent Labs Lab 10/01/13 2133  AMMONIA 26   CBC:  Recent Labs Lab 10/01/13 2118  WBC 8.3  NEUTROABS 5.7  HGB 12.2  HCT 34.4*  MCV 84.9  PLT 251   Cardiac Enzymes: No results found for this basename: CKTOTAL, CKMB, CKMBINDEX, TROPONINI,  in the last 168 hours BNP (last 3 results) No results found for this basename: PROBNP,  in the last 8760 hours CBG:  Recent Labs Lab 10/01/13 2112 10/02/13 0700  GLUCAP 110* 101*    No results found for this or any previous visit (from the past 240 hour(s)).   Studies: Dg Chest 1 View  10/01/2013   CLINICAL DATA:  Altered mental status.  EXAM: CHEST - 1 VIEW  COMPARISON:  09/21/2013  FINDINGS: Slightly shallow inspiration. Normal heart size and pulmonary vascularity. No focal airspace disease or consolidation in the lungs. Degenerative changes in the spine and shoulders with postoperative change in the right shoulder. No significant change since previous study.  IMPRESSION: No active disease.   Electronically Signed   By: Burman Nieves M.D.   On: 10/01/2013 22:55   Ct Head Wo Contrast  10/01/2013   CLINICAL DATA:  Altered mental status.  EXAM: CT HEAD WITHOUT CONTRAST  TECHNIQUE: Contiguous axial images were obtained from the base of the skull through the vertex without intravenous contrast.  COMPARISON:  CT  HEAD W/O CM dated 08/04/2013  FINDINGS: Diffuse cerebral atrophy. Mild ventricular dilatation consistent with central atrophy. Low-attenuation changes in the deep white matter consistent with small vessel ischemia. No significant changes since prior study. No mass effect or midline shift. No abnormal extra-axial fluid collections. Gray-white matter junctions are distinct. Basal cisterns are not effaced. No evidence of acute intracranial hemorrhage. No depressed skull fractures. Mucosal thickening in the paranasal sinuses. Mastoid air cells are not opacified.  IMPRESSION: No acute intracranial abnormalities. Chronic atrophy and small  vessel ischemic changes. Presumed inflammatory changes in the paranasal sinuses.   Electronically Signed   By: Burman Nieves M.D.   On: 10/01/2013 21:58   Mri Brain Without Contrast  10/03/2013   CLINICAL DATA:  Encephalopathy.  EXAM: MRI HEAD WITHOUT CONTRAST  MRA HEAD WITHOUT CONTRAST  TECHNIQUE: Multiplanar, multiecho pulse sequences of the brain and surrounding structures were obtained without intravenous contrast. Angiographic images of the head were obtained using MRA technique without contrast.  COMPARISON:  CT head 10/01/2013.  MRI brain 12/18/2012.  FINDINGS: MRI HEAD FINDINGS  The diffusion-weighted images demonstrate no evidence for acute or subacute infarction. Mild diffuse atrophy and white matter disease is stable from the prior exam in typical for age. The ventricles are proportionate to the degree of atrophy. Stent the granulomatous changes in the choroid plexus are stable bilaterally. No significant extraaxial fluid collection is present.  No acute infarct, hemorrhage, or mass lesion is present.  Flow is present in the major intracranial arteries. Patient is status post bilateral lens replacements. Mild mucosal thickening is present throughout the paranasal sinuses. Chronic opacification of the left sphenoid sinus is stable. The mastoid air cells are clear.  MRA HEAD FINDINGS  The internal carotid arteries are within normal limits from the high cervical segments through the ICA termini bilaterally. The A1 and M1 segments are normal. The anterior communicating artery is patent. The ACA and MCA branch vessels are intact.  The left vertebral artery is dominant. The right PICA origin is visualized and normal. The left AICA is dominant. Both posterior cerebral arteries originate from the basilar tip. There is mild narrowing of proximal left PCA. The distal PCA branch vessels are intact.  IMPRESSION: 1. Stable and normal MRI appearance of the brain for age. 2. Mild narrowing of the proximal left  posterior cerebral artery. 3. No other significant proximal stenosis, aneurysm, or branch vessel occlusion.   Electronically Signed   By: Gennette Pac M.D.   On: 10/03/2013 09:22   Mr Maxine Glenn Head/brain Wo Cm  10/03/2013   CLINICAL DATA:  Encephalopathy.  EXAM: MRI HEAD WITHOUT CONTRAST  MRA HEAD WITHOUT CONTRAST  TECHNIQUE: Multiplanar, multiecho pulse sequences of the brain and surrounding structures were obtained without intravenous contrast. Angiographic images of the head were obtained using MRA technique without contrast.  COMPARISON:  CT head 10/01/2013.  MRI brain 12/18/2012.  FINDINGS: MRI HEAD FINDINGS  The diffusion-weighted images demonstrate no evidence for acute or subacute infarction. Mild diffuse atrophy and white matter disease is stable from the prior exam in typical for age. The ventricles are proportionate to the degree of atrophy. Stent the granulomatous changes in the choroid plexus are stable bilaterally. No significant extraaxial fluid collection is present.  No acute infarct, hemorrhage, or mass lesion is present.  Flow is present in the major intracranial arteries. Patient is status post bilateral lens replacements. Mild mucosal thickening is present throughout the paranasal sinuses. Chronic opacification of the left sphenoid sinus is stable. The  mastoid air cells are clear.  MRA HEAD FINDINGS  The internal carotid arteries are within normal limits from the high cervical segments through the ICA termini bilaterally. The A1 and M1 segments are normal. The anterior communicating artery is patent. The ACA and MCA branch vessels are intact.  The left vertebral artery is dominant. The right PICA origin is visualized and normal. The left AICA is dominant. Both posterior cerebral arteries originate from the basilar tip. There is mild narrowing of proximal left PCA. The distal PCA branch vessels are intact.  IMPRESSION: 1. Stable and normal MRI appearance of the brain for age. 2. Mild narrowing of  the proximal left posterior cerebral artery. 3. No other significant proximal stenosis, aneurysm, or branch vessel occlusion.   Electronically Signed   By: Gennette Pac M.D.   On: 10/03/2013 09:22    Scheduled Meds: . benzonatate  200 mg Oral TID  . cefTRIAXone (ROCEPHIN)  IV  1 g Intravenous Daily  . enoxaparin (LOVENOX) injection  30 mg Subcutaneous Daily  . levothyroxine  50 mcg Oral QAC breakfast  . lisinopril  10 mg Oral Daily  . magnesium oxide  400 mg Oral Daily   Continuous Infusions:   Principal Problem:   Acute encephalopathy Active Problems:   Hypertension   Hypothyroidism   Dementia   Chronic kidney disease    Time spent: 25    [name]  [facility] Pager [phone]. If 7PM-7AM, please contact night-coverage at [email or web address], password [facility] 10/03/2013, 4:05 PM  LOS: 2 days

## 2013-10-03 NOTE — Progress Notes (Signed)
Echocardiogram 2D Echocardiogram has been performed.  10/03/2013 11:58 AM Gertie FeyMichelle Akilah Cureton, RVT, RDCS, RDMS

## 2013-10-03 NOTE — Evaluation (Signed)
Physical Therapy Evaluation Patient Details Name: [name] MRN: [id number] DOB: 23-Oct-1920 Today's Date: 10/03/2013   History of Present Illness  [name] is a 80 y.o. female who resides in an [facility] who was sent to the ED for evalauation after she was reported as being confused and hallucinating.   Her grand daughter is at the Bedside and reports that she had called to reach her grandmother by telephone and there was no answer, so she had a neighbor check on her and found her confused at the time of around 8:30 pm.   So the granddaughter went over and her grandmother was not able to recognize her and did not know who she was either.   She was sent to the ED, and evaluated and while in the ED her symptoms resolved and she was back to her baseline after 4 hours.   A ct scan of the head was performed and found to be negative for acute findings.   She was referred for medical admission for a TIA workup.   Of Note, she was treated for bronchitis 2 weeks ago and took Azithromycin.      Clinical Impression  Pt admitted with acute encephalopathy, r/o TIA. Pt currently with functional limitations due to the deficits listed below (see PT Problem List).  Pt will benefit from skilled PT to increase their independence and safety with mobility to allow discharge to the venue listed below.     Follow Up Recommendations Home health PT;Supervision/Assistance - 24 hour    Equipment Recommendations  None recommended by PT    Recommendations for Other Services       Precautions / Restrictions Precautions Precautions: Fall Restrictions Weight Bearing Restrictions: No      Mobility  Bed Mobility Overal bed mobility: Modified Independent             General bed mobility comments: mod I with rails and HOB elevated  Transfers Overall transfer level: Needs assistance Equipment used: Rolling walker (2 wheeled) Transfers: Sit to/from Stand Sit to Stand: Supervision             Ambulation/Gait Ambulation/Gait assistance: Supervision Ambulation Distance (Feet): 300 Feet Assistive device: Rolling walker (2 wheeled) Gait Pattern/deviations: Decreased stance time - left;Step-to pattern Gait velocity: decreased Gait velocity interpretation: Below normal speed for age/gender    Stairs            Wheelchair Mobility    Modified Rankin (Stroke Patients Only) Modified Rankin (Stroke Patients Only) Pre-Morbid Rankin Score: No symptoms Modified Rankin: No symptoms     Balance Overall balance assessment: Needs assistance Sitting-balance support: Feet supported;No upper extremity supported Sitting balance-Leahy Scale: Good     Standing balance support: Bilateral upper extremity supported;During functional activity Standing balance-Leahy Scale: Good                               Pertinent Vitals/Pain Pt. C/o LLE calf pain, did not rate.  Repositioned and improved with ambulation.    Home Living Family/patient expects to be discharged to:: Assisted living (independent living)               Home Equipment: Dan Humphreys - 2 wheels;Cane - quad;Hospital bed      Prior Function Level of Independence: Independent with assistive device(s)               Hand Dominance   Dominant Hand: Right  Extremity/Trunk Assessment   Upper Extremity Assessment: Defer to OT evaluation           Lower Extremity Assessment: Generalized weakness      Cervical / Trunk Assessment: Kyphotic  Communication   Communication: No difficulties  Cognition Arousal/Alertness: Awake/alert Behavior During Therapy: WFL for tasks assessed/performed Overall Cognitive Status: History of cognitive impairments - at baseline       Memory: Decreased short-term memory              General Comments      Exercises        Assessment/Plan    PT Assessment Patient needs continued PT services  PT Diagnosis Difficulty walking;Abnormality  of gait;Generalized weakness   PT Problem List Decreased activity tolerance;Decreased balance;Decreased mobility;Decreased strength;Decreased safety awareness;Decreased knowledge of use of DME;Decreased cognition;Decreased knowledge of precautions  PT Treatment Interventions DME instruction;Gait training;Functional mobility training;Therapeutic activities;Therapeutic exercise;Balance training;Neuromuscular re-education;Cognitive remediation;Patient/family education   PT Goals (Current goals can be found in the Care Plan section) Acute Rehab PT Goals Patient Stated Goal: to go home PT Goal Formulation: Patient unable to participate in goal setting Time For Goal Achievement: 10/10/13 Potential to Achieve Goals: Good    Frequency Min 3X/week   Barriers to discharge Decreased caregiver support pt. with history of dementia; feel pt may need 24 hour supervision for safety in home.  Pt. reports she still cooks at home however no family present to verify.    Co-evaluation               End of Session Equipment Utilized During Treatment: Gait belt Activity Tolerance: Patient tolerated treatment well Patient left: in chair;with chair alarm set;with call bell/phone within reach Nurse Communication: Mobility status    Functional Assessment Tool Used: clinical judgement Functional Limitation: Mobility: Walking and moving around Mobility: Walking and Moving Around Current Status ([id number]): At least 20 percent but less than 40 percent impaired, limited or restricted Mobility: Walking and Moving Around Goal Status [phone]): At least 1 percent but less than 20 percent impaired, limited or restricted    Time: 0981-1914 PT Time Calculation (min): 25 min   Charges:   PT Evaluation $Initial PT Evaluation Tier I: 1 Procedure PT Treatments $Gait Training: 8-22 mins   PT G Codes:   Functional Assessment Tool Used: clinical judgement Functional Limitation: Mobility: Walking and moving around     Clarita Crane 10/03/2013, 1:28 PM   Clarita Crane, PT, DPT [phone]

## 2013-10-04 DIAGNOSIS — F411 Generalized anxiety disorder: Secondary | ICD-10-CM | POA: Diagnosis present

## 2013-10-04 DIAGNOSIS — F0390 Unspecified dementia without behavioral disturbance: Secondary | ICD-10-CM | POA: Diagnosis present

## 2013-10-04 DIAGNOSIS — Z87891 Personal history of nicotine dependence: Secondary | ICD-10-CM | POA: Diagnosis not present

## 2013-10-04 DIAGNOSIS — M129 Arthropathy, unspecified: Secondary | ICD-10-CM | POA: Diagnosis present

## 2013-10-04 DIAGNOSIS — Z79899 Other long term (current) drug therapy: Secondary | ICD-10-CM | POA: Diagnosis not present

## 2013-10-04 DIAGNOSIS — N182 Chronic kidney disease, stage 2 (mild): Secondary | ICD-10-CM | POA: Diagnosis present

## 2013-10-04 DIAGNOSIS — N390 Urinary tract infection, site not specified: Secondary | ICD-10-CM | POA: Diagnosis present

## 2013-10-04 DIAGNOSIS — F29 Unspecified psychosis not due to a substance or known physiological condition: Secondary | ICD-10-CM | POA: Diagnosis present

## 2013-10-04 DIAGNOSIS — Z9849 Cataract extraction status, unspecified eye: Secondary | ICD-10-CM | POA: Diagnosis not present

## 2013-10-04 DIAGNOSIS — F329 Major depressive disorder, single episode, unspecified: Secondary | ICD-10-CM | POA: Diagnosis present

## 2013-10-04 DIAGNOSIS — G9340 Encephalopathy, unspecified: Secondary | ICD-10-CM | POA: Diagnosis present

## 2013-10-04 DIAGNOSIS — F3289 Other specified depressive episodes: Secondary | ICD-10-CM | POA: Diagnosis present

## 2013-10-04 DIAGNOSIS — I129 Hypertensive chronic kidney disease with stage 1 through stage 4 chronic kidney disease, or unspecified chronic kidney disease: Secondary | ICD-10-CM | POA: Diagnosis present

## 2013-10-04 DIAGNOSIS — E039 Hypothyroidism, unspecified: Secondary | ICD-10-CM | POA: Diagnosis present

## 2013-10-04 MED ORDER — ASPIRIN 81 MG PO CHEW: 81.0000 mg | CHEWABLE_TABLET | Freq: Every day | ORAL | Status: DC

## 2013-10-04 MED ORDER — ASPIRIN 81 MG PO CHEW
81.0000 mg | CHEWABLE_TABLET | Freq: Every day | ORAL | Status: DC
  Administered 2013-10-04: 81 mg via ORAL
  Filled 2013-10-04: qty 1

## 2013-10-04 MED ORDER — CEFUROXIME AXETIL 500 MG PO TABS: 500.0000 mg | ORAL_TABLET | Freq: Two times a day (BID) | ORAL | Status: DC

## 2013-10-04 MED ORDER — GUAIFENESIN-DM 100-10 MG/5ML PO SYRP: 5.0000 mL | ORAL_SOLUTION | ORAL | Status: DC | PRN

## 2013-10-04 NOTE — Clinical Social Work Note (Signed)
CSW met with patient's family this morning to discuss SNF placement (full assessment to come). Family (patient's son and daughter) believe that patient will be best taken care of in a SNF or ALF setting. CSW initiated SNF search and [facility] offered a bed but [facility] Medicare denied patient for SNF placement. CSW notified patient's son by phone who stated that he will be taking the patient home as they cannot afford to pay privately for SNF. CSW stressed the importance that the patient have 24hr. Supervision/assistance and son understood. CSW explained that since patient's insurance will not pay for SNF placement that he and his sister should try to get the patient into an ALF. CSW has given patient's RN list of ALF's in [facility] with instruction to give to patient's son when he picks patient up so that son can follow up with this post discharge. MD has been notified. Patient is medically stable for discharge and plan is for discharge home. CSW signing off at this time.    Liz Beach MSW, [place], [place], [phone]

## 2013-10-04 NOTE — Progress Notes (Signed)
Physical Therapy Treatment Patient Details Name: [name] MRN: [id number] DOB: 1920/07/15 Today's Date: 10/04/2013    History of Present Illness [name] is a 80 y.o. female who resides in an [facility] Living [facility] who was sent to the ED for evalauation after she was reported as being confused and hallucinating.   Her grand daughter is at the Bedside and reports that she had called to reach her grandmother by telephone and there was no answer, so she had a neighbor check on her and found her confused at the time of around 8:30 pm.   So the granddaughter went over and her grandmother was not able to recognize her and did not know who she was either.   She was sent to the ED, and evaluated and while in the ED her symptoms resolved and she was back to her baseline after 4 hours.   A ct scan of the head was performed and found to be negative for acute findings.   She was referred for medical admission for a TIA workup.   Of Note, she was treated for bronchitis 2 weeks ago and took Azithromycin.    Pt dx with acute encepholopathy likely due to UTI.      PT Comments    Pt requires more assist and fatigues much easier with quad cane than with RW.  She would benefit from using the RW until she gets stronger at home.  PT will continue to follow acutely.    Follow Up Recommendations  Home health PT;Supervision/Assistance - 24 hour     Equipment Recommendations  None recommended by PT    Recommendations for Other Services   NA     Precautions / Restrictions Precautions Precautions: Fall Precaution Comments: mildly unsteday on her feet.     Mobility  Bed Mobility Overal bed mobility: Modified Independent             General bed mobility comments: used bed rail for leverage to get back into flat bed.  Used momentum to swing feet up into bed.   Transfers Overall transfer level: Needs assistance Equipment used: Quad cane Transfers: Sit to/from Stand Sit to Stand: Min guard         General transfer comment: min guard assist for safety  Ambulation/Gait Ambulation/Gait assistance: Min guard Ambulation Distance (Feet): 150 Feet Assistive device: Quad cane Gait Pattern/deviations: Step-through pattern;Shuffle;Staggering right;Staggering left     General Gait Details: mildly staggering gait pattern, more energy exerted with quad cane use than with walker and requiring more assistance.        Balance Overall balance assessment: Needs assistance Sitting-balance support: Feet supported;No upper extremity supported Sitting balance-Leahy Scale: Good     Standing balance support: Single extremity supported Standing balance-Leahy Scale: Fair                      Cognition Arousal/Alertness: Awake/alert Behavior During Therapy: WFL for tasks assessed/performed Overall Cognitive Status: History of cognitive impairments - at baseline                      Exercises General Exercises - Lower Extremity Long Arc Quad: AROM;Both;10 reps;Seated Hip ABduction/ADduction: AROM;Both;10 reps;Seated (adduct against pillow for resistance) Hip Flexion/Marching: AROM;Both;10 reps;Seated Toe Raises: AROM;Both;10 reps;Seated Heel Raises: AROM;Both;10 reps;Seated        Pertinent Vitals/Pain See vitals flow sheet.            PT Goals (current goals can now be  found in the care plan section) Acute Rehab PT Goals Patient Stated Goal: Go home Progress towards PT goals: Progressing toward goals    Frequency  Min 3X/week    PT Plan Current plan remains appropriate       End of Session   Activity Tolerance: Patient limited by fatigue Patient left: in bed;with call bell/phone within reach;with bed alarm set     Time: 9147-8295 PT Time Calculation (min): 31 min  Charges:  $Gait Training: 8-22 mins $Therapeutic Exercise: 8-22 mins                      Dierra Riesgo B. Gailen Venne, PT, DPT [phone]   10/04/2013, 5:31 PM

## 2013-10-04 NOTE — Clinical Social Work Psychosocial (Signed)
Clinical Social Work Department BRIEF PSYCHOSOCIAL ASSESSMENT 10/04/2013  Patient:  [name],[name]     Account Number:  [id number]     Admit date:  10/01/2013  Clinical Social Worker:  , BRYANT, LCSWA  Date/Time:  10/04/2013 11:00 AM  Referred by:  Physician  Date Referred:  10/04/2013 Referred for  SNF Placement   Other Referral:   Interview type:  Family Other interview type:   Patient not oriented at time of assessment patient's son and daughter interviewed.    PSYCHOSOCIAL DATA Living Status:  ALONE Admitted from facility:   Level of care:  Independent Living Primary support name:  [name] Blake Primary support relationship to patient:  CHILD, ADULT Degree of support available:   Support is good.    CURRENT CONCERNS Current Concerns  Post-Acute Placement   Other Concerns:    SOCIAL WORK ASSESSMENT / PLAN CSW met with patient's son and daughter to complete assessment. Son and daughter feel that patient needs to go to SNF even though patient is ambulating 300ft per PT and PT is recommending HHPT. CSW explained that if patient's insurance company does not approve her for SNF that the patient will need to return home with HHPT and support from family. Patient's daughter and son were agreeable to this, but daughter lives in [place] and will not be able to provide that much assistance. CSW suggested that family start looking at ALF options for patient and that this could be done post discharge. Patient's family voiced concerns about her memory loss and state that this is really their main concern about patient as she forgets to take a lot of her medications. CSW encouraged family to look into ALFs with memory care if patient is not approved for SNF.   Assessment/plan status:  Psychosocial Support/Ongoing Assessment of Needs Other assessment/ plan:   Complete FL2, Fax, PASRR   Information/referral to community resources:   CSW contact information, SNF list, and ALF  list given to family.    PATIENT'S/FAMILY'S RESPONSE TO PLAN OF CARE: Patient's daughter and son are overwhelmed with patient as they feel that she needs long term care at a SNF due to her cognitive deficits. They state that the patient is non-compliant, and has always refused any type of placement in the past. They understand that patient will not go to SNF unless her insurance approves a SNF stay. Family understands that patient will need 24hr assistance/supervision at home and state they can provide this if patient cannot go to SNF. They specifically state, "If her insurance won't pay we will take her home and take care of her because we cannot pay privately." CSW will assist with DC.       Bryant  MSW, LCSWA, LCASA, [phone] 

## 2013-10-04 NOTE — Progress Notes (Signed)
Pt d/c to home by car with family. Assessment stable. Case management paged to set up home health PT/OT.

## 2013-10-04 NOTE — Progress Notes (Signed)
UR Completed.  Bartosz Luginbill Jane Mishka Stegemann [phone] 10/04/2013  

## 2013-10-04 NOTE — Clinical Social Work Note (Signed)
11:30AM: CSW has placed FL2, and dementia note on patient's chart for PASRR number for MD's signature. CSW has faxed patient's information out for long term SNF placement for patient. [facility] beds. Patient's son informed and told to make decision on facility ASAP. Patient cannot be discharged to SNF until PASRR number has been received.   Roddie McBryant Ayvion Kavanagh MSW, [place], [place], [phone]

## 2013-10-04 NOTE — Discharge Summary (Signed)
Physician Discharge Summary  [name] NFA:[id number] DOB: [birth year] DOA: 10/01/2013  PCP: Danise Edge, MD  Admit date: 10/01/2013 Discharge date: 10/04/2013  Time spent: >30  minutes  Recommendations for Outpatient Follow-up:  Follow-up Information   Follow up with Danise Edge, MD. (in 1week, call for appt upon discharge)    Specialty:  Family Medicine   Contact information:   [address] [place] [address] [phone]        Discharge Diagnoses:  Principal Problem:   Acute encephalopathy Active Problems:   Hypertension   Hypothyroidism   Dementia   Chronic kidney disease   UTI (lower urinary tract infection)   Discharge Condition: Improved/stable  Diet recommendation: Low-sodium heart healthy  Filed Weights   10/02/13 0200  Weight: 82.2 kg (181 lb 3.5 oz)    History of present illness:  [name] is a 80 y.o. female who resides in an [facility] who was sent to the ED for evalauation after she was reported as being confused and hallucinating. Her grand daughter is at the Bedside and reports that she had called to reach her grandmother by telephone and there was no answer, so she had a neighbor check on her and found her confused at the time of around 8:30 pm. So the granddaughter went over and her grandmother was not able to recognize her and did not know who she was either. She was sent to the ED, and evaluated and while in the ED her symptoms resolved and she was back to her baseline after 4 hours. A ct scan of the head was performed and found to be negative for acute findings. She was referred for medical admission for a TIA workup. Of Note, she was treated for bronchitis 2 weeks ago and took Azithromycin. She was admitted for further evaluation and management. [facility] Course:  1. Acute Encephalopathy  - As discussed above Symptoms resolved while in ED. MRI was done and came back negative for infarct and carotid  Dopplers with no significant ICA stenosis.  - 2-D echo back today as below with EF 60-65% - Patient to continue aspirin 81 mg on discharge. -UTI possible contributing factor, treated empirically with antibiotics.  PT was consulted and home health services recommended. 2. HTN  - Continue Lisinopril, follow  3. Hypothyroidism  - TSH checked on 04/23 and was 0.7, Continue Levothyroxine  4. CKD- Stage II- GFR= 50%,  - follow recheck the medicine a.m.  5. Dementia-  - patient now at baseline mentation, follow up outpatient with PCP 6. Probable UTI  Urinalysis was done and consistent with UTI , patient was started on Rocephin empirically.urine cultures grew only grew multiple bacterial morphotypes  -will d/c on ceftin, follow up with PCP. She has been afebrile with no leukocytosis   Procedures:   echocardiogram  Study Conclusions  - Left ventricle: The cavity size was normal. Wall thickness was normal. Systolic function was normal. The estimated ejection fraction was in the range of 60% to 65%. Wall motion was normal; there were no regional wall motion abnormalities. Left ventricular diastolic function parameters were normal. - Aortic valve: Moderate regurgitation. - Mitral valve: Moderate regurgitation. Transthoracic echocardiography.   carotid Doppler  Summary: Right: mild soft plaque origin ICA. Left: mild to moderate mixed plaque origin ICA. Bilateral: 1-39% ICA stenosis. Vertebral artery flow is antegrade.  Consultations:  None  Discharge Exam: Filed Vitals:   10/04/13 1436  BP: 138/57  Pulse:  59  Temp: 98.6 F (37 C)  Resp: 20   Exam:  General: alert & oriented x 2 she In NAD  Cardiovascular: RRR, nl S1 s2  Respiratory: CTAB  Abdomen: soft +BS NT/ND, no masses palpable  Extremities: No cyanosis and no edema    Discharge Instructions You were cared for by a hospitalist during your hospital stay. If you have any questions about your discharge medications or  the care you received while you were in the hospital after you are discharged, you can call the unit and asked to speak with the hospitalist on call if the hospitalist that took care of you is not available. Once you are discharged, your primary care physician will handle any further medical issues. Please note that NO REFILLS for any discharge medications will be authorized once you are discharged, as it is imperative that you return to your primary care physician (or establish a relationship with a primary care physician if you do not have one) for your aftercare needs so that they can reassess your need for medications and monitor your lab values.  Discharge Orders   Future Appointments Provider Department Dept Phone   10/25/2013 2:15 PM Bradd Canary, MD [facility] at  [facility] [phone]   Future Orders Complete By Expires   Diet - low sodium heart healthy  As directed    Increase activity slowly  As directed        Medication List    STOP taking these medications       azithromycin 250 MG tablet  Commonly known as:  ZITHROMAX      TAKE these medications       acetaminophen 325 MG tablet  Commonly known as:  TYLENOL  Take 650 mg by mouth every 6 (six) hours as needed for moderate pain.     albuterol 108 (90 BASE) MCG/ACT inhaler  Commonly known as:  PROVENTIL HFA;VENTOLIN HFA  Inhale 2 puffs into the lungs every 6 (six) hours as needed for wheezing or shortness of breath.     benzonatate 100 MG capsule  Commonly known as:  TESSALON  Take 1 capsule (100 mg total) by mouth 3 (three) times daily as needed for cough.     cefUROXime 500 MG tablet  Commonly known as:  CEFTIN  Take 1 tablet (500 mg total) by mouth 2 (two) times daily with a meal.     guaiFENesin-dextromethorphan 100-10 MG/5ML syrup  Commonly known as:  ROBITUSSIN DM  Take 5 mLs by mouth every 4 (four) hours as needed for cough.     levothyroxine 50 MCG tablet  Commonly known as:  SYNTHROID,  LEVOTHROID  Take 1 tablet (50 mcg total) by mouth daily.     lisinopril 10 MG tablet  Commonly known as:  PRINIVIL,ZESTRIL  Take 1 tablet (10 mg total) by mouth daily.     LORazepam 1 MG tablet  Commonly known as:  ATIVAN  Take 0.5 mg by mouth every 8 (eight) hours as needed for anxiety.     Magnesium 250 MG Tabs  Take 250 mg by mouth daily.      Aspirin 81 mg by mouth daily    Allergies  Allergen Reactions  . Codeine Rash  . Hydrocodone Rash      The results of significant diagnostics from this hospitalization (including imaging, microbiology, ancillary and laboratory) are listed below for reference.    Significant Diagnostic Studies: Dg Chest 1 View  10/01/2013   CLINICAL DATA:  Altered mental status.  EXAM: CHEST - 1 VIEW  COMPARISON:  09/21/2013  FINDINGS: Slightly shallow inspiration. Normal heart size and pulmonary vascularity. No focal airspace disease or consolidation in the lungs. Degenerative changes in the spine and shoulders with postoperative change in the right shoulder. No significant change since previous study.  IMPRESSION: No active disease.   Electronically Signed   By: Burman Nieves M.D.   On: 10/01/2013 22:55   Dg Chest 2 View  09/21/2013   CLINICAL DATA:  Cough  EXAM: CHEST - 2 VIEW  COMPARISON:  DG CHEST 2 VIEW dated 03/29/2013  FINDINGS: UPPER NORMAL HEART SIZE. CLEAR LUNGS. NO PNEUMOTHORAX OR PLEURAL EFFUSION. INCREASED AP DIAMETER OF THE CHEST WITH FLATTENING OF THE DIAPHRAGMS. CHRONIC CHANGES IN THE THORACIC SPINE.  IMPRESSION: No active cardiopulmonary disease.  Changes related to COPD.   Electronically Signed   By: Maryclare Bean M.D.   On: 09/21/2013 18:36   Ct Head Wo Contrast  10/01/2013   CLINICAL DATA:  Altered mental status.  EXAM: CT HEAD WITHOUT CONTRAST  TECHNIQUE: Contiguous axial images were obtained from the base of the skull through the vertex without intravenous contrast.  COMPARISON:  CT HEAD W/O CM dated 08/04/2013  FINDINGS: Diffuse cerebral  atrophy. Mild ventricular dilatation consistent with central atrophy. Low-attenuation changes in the deep white matter consistent with small vessel ischemia. No significant changes since prior study. No mass effect or midline shift. No abnormal extra-axial fluid collections. Gray-white matter junctions are distinct. Basal cisterns are not effaced. No evidence of acute intracranial hemorrhage. No depressed skull fractures. Mucosal thickening in the paranasal sinuses. Mastoid air cells are not opacified.  IMPRESSION: No acute intracranial abnormalities. Chronic atrophy and small vessel ischemic changes. Presumed inflammatory changes in the paranasal sinuses.   Electronically Signed   By: Burman Nieves M.D.   On: 10/01/2013 21:58   Mri Brain Without Contrast  10/03/2013   CLINICAL DATA:  Encephalopathy.  EXAM: MRI HEAD WITHOUT CONTRAST  MRA HEAD WITHOUT CONTRAST  TECHNIQUE: Multiplanar, multiecho pulse sequences of the brain and surrounding structures were obtained without intravenous contrast. Angiographic images of the head were obtained using MRA technique without contrast.  COMPARISON:  CT head 10/01/2013.  MRI brain 12/18/2012.  FINDINGS: MRI HEAD FINDINGS  The diffusion-weighted images demonstrate no evidence for acute or subacute infarction. Mild diffuse atrophy and white matter disease is stable from the prior exam in typical for age. The ventricles are proportionate to the degree of atrophy. Stent the granulomatous changes in the choroid plexus are stable bilaterally. No significant extraaxial fluid collection is present.  No acute infarct, hemorrhage, or mass lesion is present.  Flow is present in the major intracranial arteries. Patient is status post bilateral lens replacements. Mild mucosal thickening is present throughout the paranasal sinuses. Chronic opacification of the left sphenoid sinus is stable. The mastoid air cells are clear.  MRA HEAD FINDINGS  The internal carotid arteries are within  normal limits from the high cervical segments through the ICA termini bilaterally. The A1 and M1 segments are normal. The anterior communicating artery is patent. The ACA and MCA branch vessels are intact.  The left vertebral artery is dominant. The right PICA origin is visualized and normal. The left AICA is dominant. Both posterior cerebral arteries originate from the basilar tip. There is mild narrowing of proximal left PCA. The distal PCA branch vessels are intact.  IMPRESSION: 1. Stable and normal MRI appearance of the brain for age. 2. Mild narrowing  of the proximal left posterior cerebral artery. 3. No other significant proximal stenosis, aneurysm, or branch vessel occlusion.   Electronically Signed   By: Gennette Pac M.D.   On: 10/03/2013 09:22   Mr Maxine Glenn Head/brain Wo Cm  10/03/2013   CLINICAL DATA:  Encephalopathy.  EXAM: MRI HEAD WITHOUT CONTRAST  MRA HEAD WITHOUT CONTRAST  TECHNIQUE: Multiplanar, multiecho pulse sequences of the brain and surrounding structures were obtained without intravenous contrast. Angiographic images of the head were obtained using MRA technique without contrast.  COMPARISON:  CT head 10/01/2013.  MRI brain 12/18/2012.  FINDINGS: MRI HEAD FINDINGS  The diffusion-weighted images demonstrate no evidence for acute or subacute infarction. Mild diffuse atrophy and white matter disease is stable from the prior exam in typical for age. The ventricles are proportionate to the degree of atrophy. Stent the granulomatous changes in the choroid plexus are stable bilaterally. No significant extraaxial fluid collection is present.  No acute infarct, hemorrhage, or mass lesion is present.  Flow is present in the major intracranial arteries. Patient is status post bilateral lens replacements. Mild mucosal thickening is present throughout the paranasal sinuses. Chronic opacification of the left sphenoid sinus is stable. The mastoid air cells are clear.  MRA HEAD FINDINGS  The internal carotid  arteries are within normal limits from the high cervical segments through the ICA termini bilaterally. The A1 and M1 segments are normal. The anterior communicating artery is patent. The ACA and MCA branch vessels are intact.  The left vertebral artery is dominant. The right PICA origin is visualized and normal. The left AICA is dominant. Both posterior cerebral arteries originate from the basilar tip. There is mild narrowing of proximal left PCA. The distal PCA branch vessels are intact.  IMPRESSION: 1. Stable and normal MRI appearance of the brain for age. 2. Mild narrowing of the proximal left posterior cerebral artery. 3. No other significant proximal stenosis, aneurysm, or branch vessel occlusion.   Electronically Signed   By: Gennette Pac M.D.   On: 10/03/2013 09:22    Microbiology: No results found for this or any previous visit (from the past 240 hour(s)).   Labs: Basic Metabolic Panel:  Recent Labs Lab 10/01/13 2118  NA 144  K 3.7  CL 107  CO2 20  GLUCOSE 110*  BUN 13  CREATININE 1.08  CALCIUM 9.3   Liver Function Tests:  Recent Labs Lab 10/01/13 2118  AST 35  ALT 40*  ALKPHOS 77  BILITOT 0.7  PROT 7.0  ALBUMIN 3.5   No results found for this basename: LIPASE, AMYLASE,  in the last 168 hours  Recent Labs Lab 10/01/13 2133  AMMONIA 26   CBC:  Recent Labs Lab 10/01/13 2118  WBC 8.3  NEUTROABS 5.7  HGB 12.2  HCT 34.4*  MCV 84.9  PLT 251   Cardiac Enzymes: No results found for this basename: CKTOTAL, CKMB, CKMBINDEX, TROPONINI,  in the last 168 hours BNP: BNP (last 3 results) No results found for this basename: PROBNP,  in the last 8760 hours CBG:  Recent Labs Lab 10/01/13 2112 10/02/13 0700 10/03/13 2118  GLUCAP 110* 101* 123*       Signed:  Ezma Rehm C Hades Mathew  [facility] 10/04/2013, 5:31 PM

## 2013-10-04 NOTE — Evaluation (Signed)
Occupational Therapy Evaluation Patient Details Name: ZYONA MURLEY MRN: [id number] DOB: 02/04/1921 Today's Date: 10/04/2013    History of Present Illness CATARINA TIGUE is a 80 y.o. female who resides in an Independent Living [facility] who was sent to the ED for evalauation after she was reported as being confused and hallucinating.   Her grand daughter is at the Bedside and reports that she had called to reach her grandmother by telephone and there was no answer, so she had a neighbor check on her and found her confused at the time of around 8:30 pm.   So the granddaughter went over and her grandmother was not able to recognize her and did not know who she was either.   She was sent to the ED, and evaluated and while in the ED her symptoms resolved and she was back to her baseline after 4 hours.   A ct scan of the head was performed and found to be negative for acute findings.   She was referred for medical admission for a TIA workup.   Of Note, she was treated for bronchitis 2 weeks ago and took Azithromycin.       Clinical Impression   Pt is pleasantly confused throughout OT assessment and treatment session. ?baseline level of function, no family present. Pt currently demonstrates deficits w/ safety awareness & requires supervision-min guard assist for LB ADL's and functional transfers. She should benefit from acute OT to assist in maximizing independence w/ ADL's, followed by [facility] and 24/7 supervision/assist at d/c.    Follow Up Recommendations  Home health OT;Supervision/Assistance - 24 hour    Equipment Recommendations  Other (comment) (Cont to assess for needs, pt is confused and no family present .)    Recommendations for Other Services     Precautions / Restrictions Precautions Precautions: Fall    Mobility Bed Mobility Overal bed mobility:  (Pt up in chair)                Transfers Overall transfer level: Needs assistance Equipment used: Rolling walker (2 wheeled) Transfers:  Sit to/from Stand Sit to Stand: Supervision         General transfer comment: VC's for safety and sequencing w/ RW.    Balance Overall balance assessment: Needs assistance Sitting-balance support: Feet supported;No upper extremity supported Sitting balance-Leahy Scale: Good     Standing balance support: Bilateral upper extremity supported;No upper extremity supported;During functional activity Standing balance-Leahy Scale: Good                              ADL Overall ADL's : Needs assistance/impaired     Grooming: Supervision/safety;Sitting   Upper Body Bathing: Set up;Supervision/ safety;Sitting   Lower Body Bathing: Set up;Sit to/from stand;Min guard   Upper Body Dressing : Set up;Sitting   Lower Body Dressing: Supervision/safety;Set up;Min guard;Sit to/from stand   Toilet Transfer: Supervision/safety;RW;Grab bars;Ambulation;Comfort height toilet   Toileting- Clothing Manipulation and Hygiene: Supervision/safety;Sit to/from stand;Cueing for safety       Functional mobility during ADLs: Supervision/safety;Rolling walker General ADL Comments: Pt is overall supervision to Min guard assist for ADL's and functional mobility w/ RW in room, bathroom and for LB dressing. Pt is confused and frequently repeats herself, no family present to assist in determining baseline level of function. ?if pt is near baseline and may require 24/7 supervision/assistance for safety.   Vision  Wears glasses all the time, reports no change from baseline.  No family present to assist in determining baseline level of function.                           Pertinent Vitals/Pain Pt denies pain, states "No" when asked if she has any pain.   Hand Dominance Right   Extremity/Trunk Assessment Upper Extremity Assessment Upper Extremity Assessment: Overall WFL for tasks assessed;Generalized weakness   Lower Extremity Assessment Lower Extremity Assessment: Generalized weakness        Communication Communication Communication: No difficulties   Cognition Arousal/Alertness: Awake/alert Behavior During Therapy: WFL for tasks assessed/performed Overall Cognitive Status: History of cognitive impairments - at baseline       Memory: Decreased short-term memory                           Home Living Family/patient expects to be discharged to:: Assisted living. No family present at time of OT assessment.                             Home Equipment: Walker - 2 wheels;Cane - quad;Hospital bed          Prior Functioning/Environment Level of Independence: Independent with assistive device(s) . No family present to assist w/ PLOF, pt is confused.            OT Diagnosis: Generalized weakness;Cognitive deficits   OT Problem List: Decreased cognition;Decreased strength;Decreased knowledge of use of DME or AE;Decreased knowledge of precautions;Impaired vision/perception   OT Treatment/Interventions: Self-care/ADL training;DME and/or AE instruction;Patient/family education;Cognitive remediation/compensation;Therapeutic activities;Balance training    OT Goals(Current goals can be found in the care plan section) Acute Rehab OT Goals Patient Stated Goal: Go home OT Goal Formulation: Patient unable to participate in goal setting Time For Goal Achievement: 10/11/13 Potential to Achieve Goals: Good  OT Frequency: Min 2X/week   Barriers to D/C:  Lives alone in ILF/ALF                      End of Session Equipment Utilized During Treatment: Rolling walker  Activity Tolerance: Patient tolerated treatment well Patient left: in chair;with call bell/phone within reach;with chair alarm set   Time: 6222-9798 OT Time Calculation (min): 23 min Charges:  OT General Charges $OT Visit: 1 Procedure OT Evaluation $Initial OT Evaluation Tier I: 1 Procedure OT Treatments $Self Care/Home Management : 8-22 mins G-Codes: OT G-codes **NOT FOR INPATIENT  CLASS** Functional Assessment Tool Used: Clinical judgement Functional Limitation: Self care Self Care Current Status (X2119): At least 20 percent but less than 40 percent impaired, limited or restricted Self Care Goal Status (E1740): At least 1 percent but less than 20 percent impaired, limited or restricted  Lynnix Schoneman B Jessaca Philippi 10/04/2013, 11:00 AM

## 2013-10-05 LAB — URINE CULTURE
CULTURE: NO GROWTH
Colony Count: NO GROWTH

## 2013-10-05 NOTE — Care Management Note (Unsigned)
    Page 1 of 2   10/05/2013     2:49:42 PM CARE MANAGEMENT NOTE 10/05/2013  Patient:  Jeannette CorpusBAME,Sidra P   Account Number:  [id number]  Date Initiated:  10/05/2013  Documentation initiated by:  Elmer BalesOBARGE,COURTNEY  Subjective/Objective Assessment:   Patient was admitted with acute encephalopathy, UTI. Lives at home alone.  HIstory of dementia, very confused while hospitalized.     Action/Plan:   Will follow for discharge needs.  Patient's family was hopeful for SNF placement due to safety concerns related to her mental status.  Patient was denied SNF placement by insurance.   Anticipated DC Date:  10/04/2013   Anticipated DC Plan:  SKILLED NURSING FACILITY  In-house referral  Clinical Social Worker      DC Planning Services  CM consult      Choice offered to / List presented to:  C-2 HC POA / Guardian        HH arranged  HH-2 PT  HH-3 OT      HH agency  [facility]   Status of service:  Completed, signed off Medicare Important Message given?   (If response is "NO", the following Medicare IM given date fields will be blank) Date Medicare IM given:   Date Additional Medicare IM given:    Discharge Disposition:  HOME W HOME HEALTH SERVICES  Per UR Regulation:  Reviewed for med. necessity/level of care/duration of stay  If discussed at Long Length of Stay Meetings, dates discussed:    Comments:  10/05/13 1130 Elmer Balesourtney Robarge RN, MSN, CM- Recieved a call back from [place] [facility] who is unable to provide home health services due to patient's insurance.  [facility] was notified and has accepted the referral.  Voicemail was left for patient's [facility].   10/05/13 1015 Elmer Balesourtney Robarge RN, MSN, CM- [facility].  Patient was discharged home yesterday evening.  CM called patient's son/POA Valera Castle[place] Blake to discuss home health.  Son is very frustrated with patient's insurance over being denied SNF  placement, but is using the ALF resource list provided by CSW prior to discharge to look into other options.  [facility], and would prefer to use them for his mother's HH needs.  He has chosen [facility] as his second choice, which he is also familiar with.  Voicemail was left with Corrie DandyMary from [facility] to see if they are able to accept the referral.  Awaiting return call.

## 2013-10-06 ENCOUNTER — Telehealth: Payer: Self-pay | Admitting: Family Medicine

## 2013-10-06 NOTE — Telephone Encounter (Signed)
[place] [facility], does not want to see Dr. Abner GreenspanBlyth, will call her [facility] previous dr.

## 2013-10-25 ENCOUNTER — Ambulatory Visit: Payer: Medicare HMO | Admitting: Family Medicine

## 2013-10-26 ENCOUNTER — Ambulatory Visit (INDEPENDENT_AMBULATORY_CARE_PROVIDER_SITE_OTHER): Payer: Medicare HMO | Admitting: Physician Assistant

## 2013-10-26 ENCOUNTER — Encounter: Payer: Self-pay | Admitting: Physician Assistant

## 2013-10-26 VITALS — BP 146/84 | HR 60 | Temp 98.9°F | Resp 16 | Ht 65.0 in | Wt 185.5 lb

## 2013-10-26 DIAGNOSIS — M79609 Pain in unspecified limb: Secondary | ICD-10-CM | POA: Diagnosis not present

## 2013-10-26 DIAGNOSIS — N390 Urinary tract infection, site not specified: Secondary | ICD-10-CM | POA: Diagnosis not present

## 2013-10-26 DIAGNOSIS — R82998 Other abnormal findings in urine: Secondary | ICD-10-CM

## 2013-10-26 DIAGNOSIS — G9340 Encephalopathy, unspecified: Secondary | ICD-10-CM

## 2013-10-26 DIAGNOSIS — R8290 Unspecified abnormal findings in urine: Secondary | ICD-10-CM

## 2013-10-26 DIAGNOSIS — M79605 Pain in left leg: Secondary | ICD-10-CM

## 2013-10-26 LAB — POCT URINALYSIS DIPSTICK
Bilirubin, UA: NEGATIVE
Glucose, UA: NEGATIVE
Ketones, UA: NEGATIVE
Leukocytes, UA: NEGATIVE
Nitrite, UA: NEGATIVE
PROTEIN UA: NEGATIVE
SPEC GRAV UA: 1.025
UROBILINOGEN UA: 0.2
pH, UA: 5

## 2013-10-26 NOTE — Patient Instructions (Signed)
I will check you urine.  I will also call [facility] to see the status of a home health aide.  Wear a compression stocking on your left leg during the day.  Elevate leg at bedtime.  STOP putting heating pad on your leg all night long as this will cause muscle tension and cramping.  Apply a topical Icy Hot to the leg.  Please resume taking your medications as prescribed.  Follow-up with Dr. Abner Greenspan or myself in 2-3 weeks.

## 2013-10-26 NOTE — Progress Notes (Signed)
Pre visit review using our clinic review tool, if applicable. No additional management support is needed unless otherwise documented below in the visit note/SLS  

## 2013-10-28 ENCOUNTER — Telehealth: Payer: Self-pay | Admitting: Physician Assistant

## 2013-10-28 DIAGNOSIS — B379 Candidiasis, unspecified: Secondary | ICD-10-CM

## 2013-10-28 LAB — URINALYSIS, ROUTINE W REFLEX MICROSCOPIC
Bilirubin Urine: NEGATIVE
Glucose, UA: NEGATIVE mg/dL
KETONES UR: NEGATIVE mg/dL
Nitrite: NEGATIVE
Protein, ur: NEGATIVE mg/dL
SPECIFIC GRAVITY, URINE: 1.02 (ref 1.005–1.030)
UROBILINOGEN UA: 0.2 mg/dL (ref 0.0–1.0)
pH: 5.5 (ref 5.0–8.0)

## 2013-10-28 LAB — URINALYSIS, MICROSCOPIC ONLY
Casts: NONE SEEN
SQUAMOUS EPITHELIAL / LPF: NONE SEEN

## 2013-10-28 MED ORDER — FLUCONAZOLE 150 MG PO TABS: 150.0000 mg | ORAL_TABLET | Freq: Once | ORAL | Status: DC

## 2013-10-28 NOTE — Telephone Encounter (Signed)
Urine shows large amount of yeast under microscope.  Rx diflucan x 1.to treat yeast infection, likely coming from recent antibiotic use.  Also some small bacteria noted, but microscopy shows few bacteria.  There was not enough urine for culture.  If she is having urinary symptoms I recommend she return to the lab for a urine specimen.  Follow-up with Dr. Abner Greenspan or myself in 1 week.

## 2013-10-28 NOTE — Telephone Encounter (Signed)
LMOM with contact name and number [for return call, if needed] RE: results and further provider instructions/SLS  

## 2013-10-31 NOTE — Assessment & Plan Note (Signed)
Chronic.  Rx given for 20/30 grade compression stockings.  Topical Aspercreme.  Do not use heating pad for longer than 15 minute-intervals. All-night use is likely contributing to pain and spasm.

## 2013-10-31 NOTE — Assessment & Plan Note (Signed)
Will repeat UA and urine culture.

## 2013-10-31 NOTE — Progress Notes (Signed)
Patient presents to clinic today c/o for hospital follow-up.  Patient seen in ER on 10/01/13 for mental confusion and was subsequently admitted to the hospital for TIA workup. EKG, CT, MRI, Carotid doppler and echocardiogram performed and were within normal limits.  Patient found to have a UTI and was treated with Rocephin inpatient and discharged on Ceftin.  Patient endorses completing antibiotics.  Patient and daughter deny recurrence of AMS.  Patient denies urinary urgency, frequency, dysuria.  Patient with gradually-progressing dementia. Patient does endorse pain in LLE that has been present for a few years. Endorses intermittent bilateral foot/ankle swelling. Has had doppler US previously that was negative for DVT.  Patient has not worn compression stockings as instructed by her PCP.  Has been sleeping with a heating pad on leg all night long.  Past Medical History  Diagnosis Date  . Arthritis   . Chicken pox   . Chronic kidney disease   . Colon polyps   . Hypertension   . Dementia   . Angina   . Dysrhythmia   . Heart murmur   . Shortness of breath   . Recurrent upper respiratory infection (URI)   . Pneumonia   . Hypothyroidism   . Anemia   . Headache(784.0)   . Anxiety   . Depression   . Valvular heart disease 09/04/2012  . Left leg pain 10/02/2012  . UTI (urinary tract infection) 12/30/2012  . Acute bronchitis 04/10/2013  . Dehydration 08/31/2013    Current Outpatient Prescriptions on File Prior to Visit  Medication Sig Dispense Refill  . acetaminophen (TYLENOL) 325 MG tablet Take 650 mg by mouth every 6 (six) hours as needed for moderate pain.      Marland Kitchen albuterol (PROVENTIL HFA;VENTOLIN HFA) 108 (90 BASE) MCG/ACT inhaler Inhale 2 puffs into the lungs every 6 (six) hours as needed for wheezing or shortness of breath.  1 Inhaler  0  . guaiFENesin-dextromethorphan (ROBITUSSIN DM) 100-10 MG/5ML syrup Take 5 mLs by mouth every 4 (four) hours as needed for cough.  118 mL  0  . LORazepam  (ATIVAN) 1 MG tablet Take 0.5 mg by mouth every 8 (eight) hours as needed for anxiety.      Marland Kitchen levothyroxine (SYNTHROID, LEVOTHROID) 50 MCG tablet Take 1 tablet (50 mcg total) by mouth daily.  90 tablet  1  . lisinopril (PRINIVIL,ZESTRIL) 10 MG tablet Take 1 tablet (10 mg total) by mouth daily.  90 tablet  1  . Magnesium 250 MG TABS Take 250 mg by mouth daily.        No current facility-administered medications on file prior to visit.    Allergies  Allergen Reactions  . Codeine Rash  . Hydrocodone Rash    Family History  Problem Relation Age of Onset  . Heart disease Mother   . Heart disease Father   . Heart disease Other   . Birth defects Other     History   Social History  . Marital Status: Widowed    Spouse Name: N/A    Number of Children: N/A  . Years of Education: N/A   Social History Main Topics  . Smoking status: Former Smoker -- 0.20 packs/day for 1 years    Types: Cigarettes    Quit date: 06/02/1941  . Smokeless tobacco: Never Used  . Alcohol Use: No  . Drug Use: No  . Sexual Activity: Not Currently    Birth Control/ Protection: Abstinence   Other Topics Concern  . None   Social  History Narrative   Still lives at home and ambulatory daily. Doesn't use cane or walker, but has cane at home. Remote smoking history.     Review of Systems - See HPI.  All other ROS are negative.  BP 146/84  Pulse 60  Temp(Src) 98.9 F (37.2 C) (Oral)  Resp 16  Ht 5' 5"  (1.651 m)  Wt 185 lb 8 oz (84.142 kg)  BMI 30.87 kg/m2  SpO2 98%  Physical Exam  Vitals reviewed. Constitutional: She is well-developed, well-nourished, and in no distress.  HENT:  Head: Normocephalic and atraumatic.  Right Ear: External ear normal.  Left Ear: External ear normal.  Nose: Nose normal.  Mouth/Throat: Oropharynx is clear and moist. No oropharyngeal exudate.  TM within normal limits bilaterally.  Eyes: Conjunctivae are normal. Pupils are equal, round, and reactive to light.  Neck:  Neck supple. No thyromegaly present.  Cardiovascular: Normal rate, regular rhythm, normal heart sounds and intact distal pulses.   Pulses:      Popliteal pulses are 2+ on the right side, and 2+ on the left side.       Dorsalis pedis pulses are 2+ on the right side, and 2+ on the left side.       Posterior tibial pulses are 2+ on the right side, and 2+ on the left side.  No evidence of peripheral edema on examination.  Pulmonary/Chest: Effort normal and breath sounds normal. No respiratory distress. She has no wheezes. She has no rales. She exhibits no tenderness.  Abdominal: Soft. Bowel sounds are normal. She exhibits no distension and no mass. There is no tenderness. There is no rebound and no guarding.  Musculoskeletal:       Left lower leg: She exhibits tenderness. She exhibits no bony tenderness, no swelling, no edema, no deformity and no laceration.  Lymphadenopathy:    She has no cervical adenopathy.  Neurological: She has normal sensation, normal strength and intact cranial nerves.  Alert and oriented to person and place.  Skin: Skin is warm and dry. No rash noted.  Psychiatric: Affect normal.     Recent Results (from the past 2160 hour(s))  URINALYSIS, ROUTINE W REFLEX MICROSCOPIC     Status: Abnormal   Collection Time    08/04/13  4:55 PM      Result Value Ref Range   Color, Urine YELLOW  YELLOW   APPearance CLEAR  CLEAR   Specific Gravity, Urine 1.009  1.005 - 1.030   pH 6.5  5.0 - 8.0   Glucose, UA NEGATIVE  NEGATIVE mg/dL   Hgb urine dipstick NEGATIVE  NEGATIVE   Bilirubin Urine NEGATIVE  NEGATIVE   Ketones, ur NEGATIVE  NEGATIVE mg/dL   Protein, ur NEGATIVE  NEGATIVE mg/dL   Urobilinogen, UA 0.2  0.0 - 1.0 mg/dL   Nitrite NEGATIVE  NEGATIVE   Leukocytes, UA TRACE (*) NEGATIVE  URINE MICROSCOPIC-ADD ON     Status: Abnormal   Collection Time    08/04/13  4:55 PM      Result Value Ref Range   Squamous Epithelial / LPF FEW (*) RARE   WBC, UA 0-2  <3 WBC/hpf   RBC /  HPF 0-2  <3 RBC/hpf   Bacteria, UA RARE  RARE  CBC WITH DIFFERENTIAL     Status: Abnormal   Collection Time    08/04/13  5:17 PM      Result Value Ref Range   WBC 7.3  4.0 - 10.5 K/uL   RBC 3.92  3.87 - 5.11 MIL/uL   Hemoglobin 11.9 (*) 12.0 - 15.0 g/dL   HCT 34.1 (*) 36.0 - 46.0 %   MCV 87.0  78.0 - 100.0 fL   MCH 30.4  26.0 - 34.0 pg   MCHC 34.9  30.0 - 36.0 g/dL   RDW 12.9  11.5 - 15.5 %   Platelets 196  150 - 400 K/uL   Neutrophils Relative % 65  43 - 77 %   Neutro Abs 4.7  1.7 - 7.7 K/uL   Lymphocytes Relative 29  12 - 46 %   Lymphs Abs 2.1  0.7 - 4.0 K/uL   Monocytes Relative 5  3 - 12 %   Monocytes Absolute 0.4  0.1 - 1.0 K/uL   Eosinophils Relative 1  0 - 5 %   Eosinophils Absolute 0.1  0.0 - 0.7 K/uL   Basophils Relative 0  0 - 1 %   Basophils Absolute 0.0  0.0 - 0.1 K/uL  COMPREHENSIVE METABOLIC PANEL     Status: Abnormal   Collection Time    08/04/13  5:17 PM      Result Value Ref Range   Sodium 141  137 - 147 mEq/L   Potassium 4.6  3.7 - 5.3 mEq/L   Chloride 105  96 - 112 mEq/L   CO2 25  19 - 32 mEq/L   Glucose, Bld 109 (*) 70 - 99 mg/dL   BUN 17  6 - 23 mg/dL   Creatinine, Ser 1.40 (*) 0.50 - 1.10 mg/dL   Calcium 9.1  8.4 - 10.5 mg/dL   Total Protein 6.6  6.0 - 8.3 g/dL   Albumin 3.2 (*) 3.5 - 5.2 g/dL   AST 14  0 - 37 U/L   ALT 12  0 - 35 U/L   Alkaline Phosphatase 75  39 - 117 U/L   Total Bilirubin 0.3  0.3 - 1.2 mg/dL   GFR calc non Af Amer 32 (*) >90 mL/min   GFR calc Af Amer 37 (*) >90 mL/min   Comment: (NOTE)     The eGFR has been calculated using the CKD EPI equation.     This calculation has not been validated in all clinical situations.     eGFR's persistently <90 mL/min signify possible Chronic Kidney     Disease.  CBC     Status: Abnormal   Collection Time    09/21/13  5:37 PM      Result Value Ref Range   WBC 8.8  4.0 - 10.5 K/uL   RBC 4.09  3.87 - 5.11 MIL/uL   Hemoglobin 12.1  12.0 - 15.0 g/dL   HCT 35.8 (*) 36.0 - 46.0 %   MCV 87.5   78.0 - 100.0 fL   MCH 29.6  26.0 - 34.0 pg   MCHC 33.8  30.0 - 36.0 g/dL   RDW 12.9  11.5 - 15.5 %   Platelets 232  150 - 400 K/uL  BASIC METABOLIC PANEL     Status: Abnormal   Collection Time    09/21/13  5:37 PM      Result Value Ref Range   Sodium 142  137 - 147 mEq/L   Potassium 4.1  3.7 - 5.3 mEq/L   Chloride 105  96 - 112 mEq/L   CO2 22  19 - 32 mEq/L   Glucose, Bld 124 (*) 70 - 99 mg/dL   BUN 17  6 - 23 mg/dL   Creatinine, Ser 1.00  0.50 - 1.10 mg/dL   Calcium 9.2  8.4 - 10.5 mg/dL   GFR calc non Af Amer 48 (*) >90 mL/min   GFR calc Af Amer 55 (*) >90 mL/min   Comment: (NOTE)     The eGFR has been calculated using the CKD EPI equation.     This calculation has not been validated in all clinical situations.     eGFR's persistently <90 mL/min signify possible Chronic Kidney     Disease.  Randolm Idol, ED     Status: None   Collection Time    09/21/13  6:20 PM      Result Value Ref Range   Troponin i, poc 0.00  0.00 - 0.08 ng/mL   Comment 3            Comment: Due to the release kinetics of cTnI,     a negative result within the first hours     of the onset of symptoms does not rule out     myocardial infarction with certainty.     If myocardial infarction is still suspected,     repeat the test at appropriate intervals.  TSH     Status: None   Collection Time    09/23/13  2:16 AM      Result Value Ref Range   TSH 0.758  0.350 - 4.500 uIU/mL  CBG MONITORING, ED     Status: Abnormal   Collection Time    10/01/13  9:12 PM      Result Value Ref Range   Glucose-Capillary 110 (*) 70 - 99 mg/dL   Comment 1 Notify RN    APTT     Status: None   Collection Time    10/01/13  9:18 PM      Result Value Ref Range   aPTT 27  24 - 37 seconds  CBC WITH DIFFERENTIAL     Status: Abnormal   Collection Time    10/01/13  9:18 PM      Result Value Ref Range   WBC 8.3  4.0 - 10.5 K/uL   RBC 4.05  3.87 - 5.11 MIL/uL   Hemoglobin 12.2  12.0 - 15.0 g/dL   HCT 34.4 (*) 36.0 -  46.0 %   MCV 84.9  78.0 - 100.0 fL   MCH 30.1  26.0 - 34.0 pg   MCHC 35.5  30.0 - 36.0 g/dL   RDW 12.7  11.5 - 15.5 %   Platelets 251  150 - 400 K/uL   Neutrophils Relative % 69  43 - 77 %   Neutro Abs 5.7  1.7 - 7.7 K/uL   Lymphocytes Relative 25  12 - 46 %   Lymphs Abs 2.1  0.7 - 4.0 K/uL   Monocytes Relative 5  3 - 12 %   Monocytes Absolute 0.4  0.1 - 1.0 K/uL   Eosinophils Relative 1  0 - 5 %   Eosinophils Absolute 0.1  0.0 - 0.7 K/uL   Basophils Relative 0  0 - 1 %   Basophils Absolute 0.0  0.0 - 0.1 K/uL  COMPREHENSIVE METABOLIC PANEL     Status: Abnormal   Collection Time    10/01/13  9:18 PM      Result Value Ref Range   Sodium 144  137 - 147 mEq/L   Potassium 3.7  3.7 - 5.3 mEq/L   Chloride 107  96 - 112 mEq/L   CO2 20  19 - 32 mEq/L  Glucose, Bld 110 (*) 70 - 99 mg/dL   BUN 13  6 - 23 mg/dL   Creatinine, Ser 1.08  0.50 - 1.10 mg/dL   Calcium 9.3  8.4 - 10.5 mg/dL   Total Protein 7.0  6.0 - 8.3 g/dL   Albumin 3.5  3.5 - 5.2 g/dL   AST 35  0 - 37 U/L   ALT 40 (*) 0 - 35 U/L   Alkaline Phosphatase 77  39 - 117 U/L   Total Bilirubin 0.7  0.3 - 1.2 mg/dL   GFR calc non Af Amer 44 (*) >90 mL/min   GFR calc Af Amer 50 (*) >90 mL/min   Comment: (NOTE)     The eGFR has been calculated using the CKD EPI equation.     This calculation has not been validated in all clinical situations.     eGFR's persistently <90 mL/min signify possible Chronic Kidney     Disease.  PROTIME-INR     Status: None   Collection Time    10/01/13  9:18 PM      Result Value Ref Range   Prothrombin Time 12.5  11.6 - 15.2 seconds   INR 0.95  0.00 - 1.49  ETHANOL     Status: None   Collection Time    10/01/13  9:18 PM      Result Value Ref Range   Alcohol, Ethyl (B) <11  0 - 11 mg/dL   Comment:            LOWEST DETECTABLE LIMIT FOR     SERUM ALCOHOL IS 11 mg/dL     FOR MEDICAL PURPOSES ONLY  AMMONIA     Status: None   Collection Time    10/01/13  9:33 PM      Result Value Ref Range    Ammonia 26  11 - 60 umol/L  I-STAT VENOUS BLOOD GAS, ED     Status: Abnormal   Collection Time    10/01/13  9:47 PM      Result Value Ref Range   pH, Ven 7.456 (*) 7.250 - 7.300   pCO2, Ven 30.7 (*) 45.0 - 50.0 mmHg   pO2, Ven 39.0  30.0 - 45.0 mmHg   Bicarbonate 21.6  20.0 - 24.0 mEq/L   TCO2 23  0 - 100 mmol/L   O2 Saturation 77.0     Acid-base deficit 1.0  0.0 - 2.0 mmol/L   Patient temperature 98.7 F     Sample type VENOUS     Comment NOTIFIED PHYSICIAN    URINALYSIS, ROUTINE W REFLEX MICROSCOPIC     Status: Abnormal   Collection Time    10/02/13  4:23 AM      Result Value Ref Range   Color, Urine YELLOW  YELLOW   APPearance CLOUDY (*) CLEAR   Specific Gravity, Urine 1.016  1.005 - 1.030   pH 5.5  5.0 - 8.0   Glucose, UA NEGATIVE  NEGATIVE mg/dL   Hgb urine dipstick NEGATIVE  NEGATIVE   Bilirubin Urine NEGATIVE  NEGATIVE   Ketones, ur NEGATIVE  NEGATIVE mg/dL   Protein, ur NEGATIVE  NEGATIVE mg/dL   Urobilinogen, UA 0.2  0.0 - 1.0 mg/dL   Nitrite NEGATIVE  NEGATIVE   Leukocytes, UA MODERATE (*) NEGATIVE  URINE RAPID DRUG SCREEN (HOSP PERFORMED)     Status: None   Collection Time    10/02/13  4:23 AM      Result Value Ref Range   Opiates  NONE DETECTED  NONE DETECTED   Cocaine NONE DETECTED  NONE DETECTED   Benzodiazepines NONE DETECTED  NONE DETECTED   Amphetamines NONE DETECTED  NONE DETECTED   Tetrahydrocannabinol NONE DETECTED  NONE DETECTED   Barbiturates NONE DETECTED  NONE DETECTED   Comment:            DRUG SCREEN FOR MEDICAL PURPOSES     ONLY.  IF CONFIRMATION IS NEEDED     FOR ANY PURPOSE, NOTIFY LAB     WITHIN 5 DAYS.                LOWEST DETECTABLE LIMITS     FOR URINE DRUG SCREEN     Drug Class       Cutoff (ng/mL)     Amphetamine      1000     Barbiturate      200     Benzodiazepine   856     Tricyclics       314     Opiates          300     Cocaine          300     THC              50  URINE MICROSCOPIC-ADD ON     Status: Abnormal    Collection Time    10/02/13  4:23 AM      Result Value Ref Range   Squamous Epithelial / LPF FEW (*) RARE   WBC, UA 11-20  <3 WBC/hpf   RBC / HPF 0-2  <3 RBC/hpf   Bacteria, UA FEW (*) RARE   Urine-Other MANY YEAST    GLUCOSE, CAPILLARY     Status: Abnormal   Collection Time    10/02/13  7:00 AM      Result Value Ref Range   Glucose-Capillary 101 (*) 70 - 99 mg/dL   Comment 1 Documented in Chart     Comment 2 Notify RN    HEMOGLOBIN A1C     Status: Abnormal   Collection Time    10/02/13  7:27 AM      Result Value Ref Range   Hemoglobin A1C 6.2 (*) <5.7 %   Comment: (NOTE)                                                                               According to the ADA Clinical Practice Recommendations for 2011, when     HbA1c is used as a screening test:      >=6.5%   Diagnostic of Diabetes Mellitus               (if abnormal result is confirmed)     5.7-6.4%   Increased risk of developing Diabetes Mellitus     References:Diagnosis and Classification of Diabetes Mellitus,Diabetes     HFWY,6378,58(IFOYD 1):S62-S69 and Standards of Medical Care in             Diabetes - 2011,Diabetes Care,2011,34 (Suppl 1):S11-S61.   Mean Plasma Glucose 131 (*) <117 mg/dL   Comment: Performed at [facility]     Status: Abnormal   Collection  Time    10/02/13  7:27 AM      Result Value Ref Range   Cholesterol 155  0 - 200 mg/dL   Triglycerides 181 (*) <150 mg/dL   HDL 33 (*) >39 mg/dL   Total CHOL/HDL Ratio 4.7     VLDL 36  0 - 40 mg/dL   LDL Cholesterol 86  0 - 99 mg/dL   Comment:            Total Cholesterol/HDL:CHD Risk     Coronary Heart Disease Risk Table                         Men   Women      1/2 Average Risk   3.4   3.3      Average Risk       5.0   4.4      2 X Average Risk   9.6   7.1      3 X Average Risk  23.4   11.0                Use the calculated Patient Ratio     above and the CHD Risk Table     to determine the patient's CHD Risk.                 ATP III CLASSIFICATION (LDL):      <100     mg/dL   Optimal      100-129  mg/dL   Near or Above                        Optimal      130-159  mg/dL   Borderline      160-189  mg/dL   High      >190     mg/dL   Very High  GLUCOSE, CAPILLARY     Status: Abnormal   Collection Time    10/03/13  9:18 PM      Result Value Ref Range   Glucose-Capillary 123 (*) 70 - 99 mg/dL   Comment 1 Notify RN     Comment 2 Documented in Chart    URINE CULTURE     Status: None   Collection Time    10/04/13  7:07 AM      Result Value Ref Range   Specimen Description URINE, RANDOM     Special Requests NONE     Culture  Setup Time       Value: 10/04/2013 07:29     Performed at [facility]       Value: NO GROWTH     Performed at [facility]   Culture       Value: NO GROWTH     Performed at [facility]   Report Status 10/05/2013 FINAL    URINALYSIS, ROUTINE W REFLEX MICROSCOPIC     Status: Abnormal   Collection Time    10/26/13  6:21 PM      Result Value Ref Range   Color, Urine YELLOW  YELLOW   APPearance CLEAR  CLEAR   Specific Gravity, Urine 1.020  1.005 - 1.030   pH 5.5  5.0 - 8.0   Glucose, UA NEG  NEG mg/dL   Bilirubin Urine NEG  NEG   Ketones, ur NEG  NEG mg/dL   Hgb urine dipstick TRACE (*) NEG   Protein,  ur NEG  NEG mg/dL   Urobilinogen, UA 0.2  0.0 - 1.0 mg/dL   Nitrite NEG  NEG   Leukocytes, UA SMALL (*) NEG  URINALYSIS, MICROSCOPIC ONLY     Status: Abnormal   Collection Time    10/26/13  6:21 PM      Result Value Ref Range   Squamous Epithelial / LPF NONE SEEN  RARE   Crystals Y  NONE SEEN   Casts NONE SEEN  NONE SEEN   WBC, UA 7-10 (*) <3 WBC/hpf   RBC / HPF 0-2  <3 RBC/hpf   Bacteria, UA FEW (*) RARE   Urine-Other SEE NOTE     Comment: Many yeast  POCT URINALYSIS DIPSTICK     Status: None   Collection Time    10/26/13  6:25 PM      Result Value Ref Range   Color, UA straw     Clarity, UA cloudy     Glucose, UA neg      Bilirubin, UA neg     Ketones, UA neg     Spec Grav, UA 1.025     Blood, UA small, hemolyzed     pH, UA 5.0     Protein, UA neg     Urobilinogen, UA 0.2     Nitrite, UA neg     Leukocytes, UA Negative     Assessment/Plan: Acute encephalopathy Likely 2/2 UTI vs advancing dementia.  Resolved.  UTI (urinary tract infection) Will repeat UA and urine culture.   Left leg pain Chronic.  Rx given for 20/30 grade compression stockings.  Topical Aspercreme.  Do not use heating pad for longer than 15 minute-intervals. All-night use is likely contributing to pain and spasm.

## 2013-10-31 NOTE — Assessment & Plan Note (Signed)
Likely 2/2 UTI vs advancing dementia.  Resolved.

## 2013-11-02 IMAGING — CR DG CHEST 2V
2 series · 2 of 2 positions shown · non-contrast
Comparison: 09/06/2011

CLINICAL DATA: Fall. Syncope.  Hypertension.

CHEST - 2 VIEW

[w chest lat]
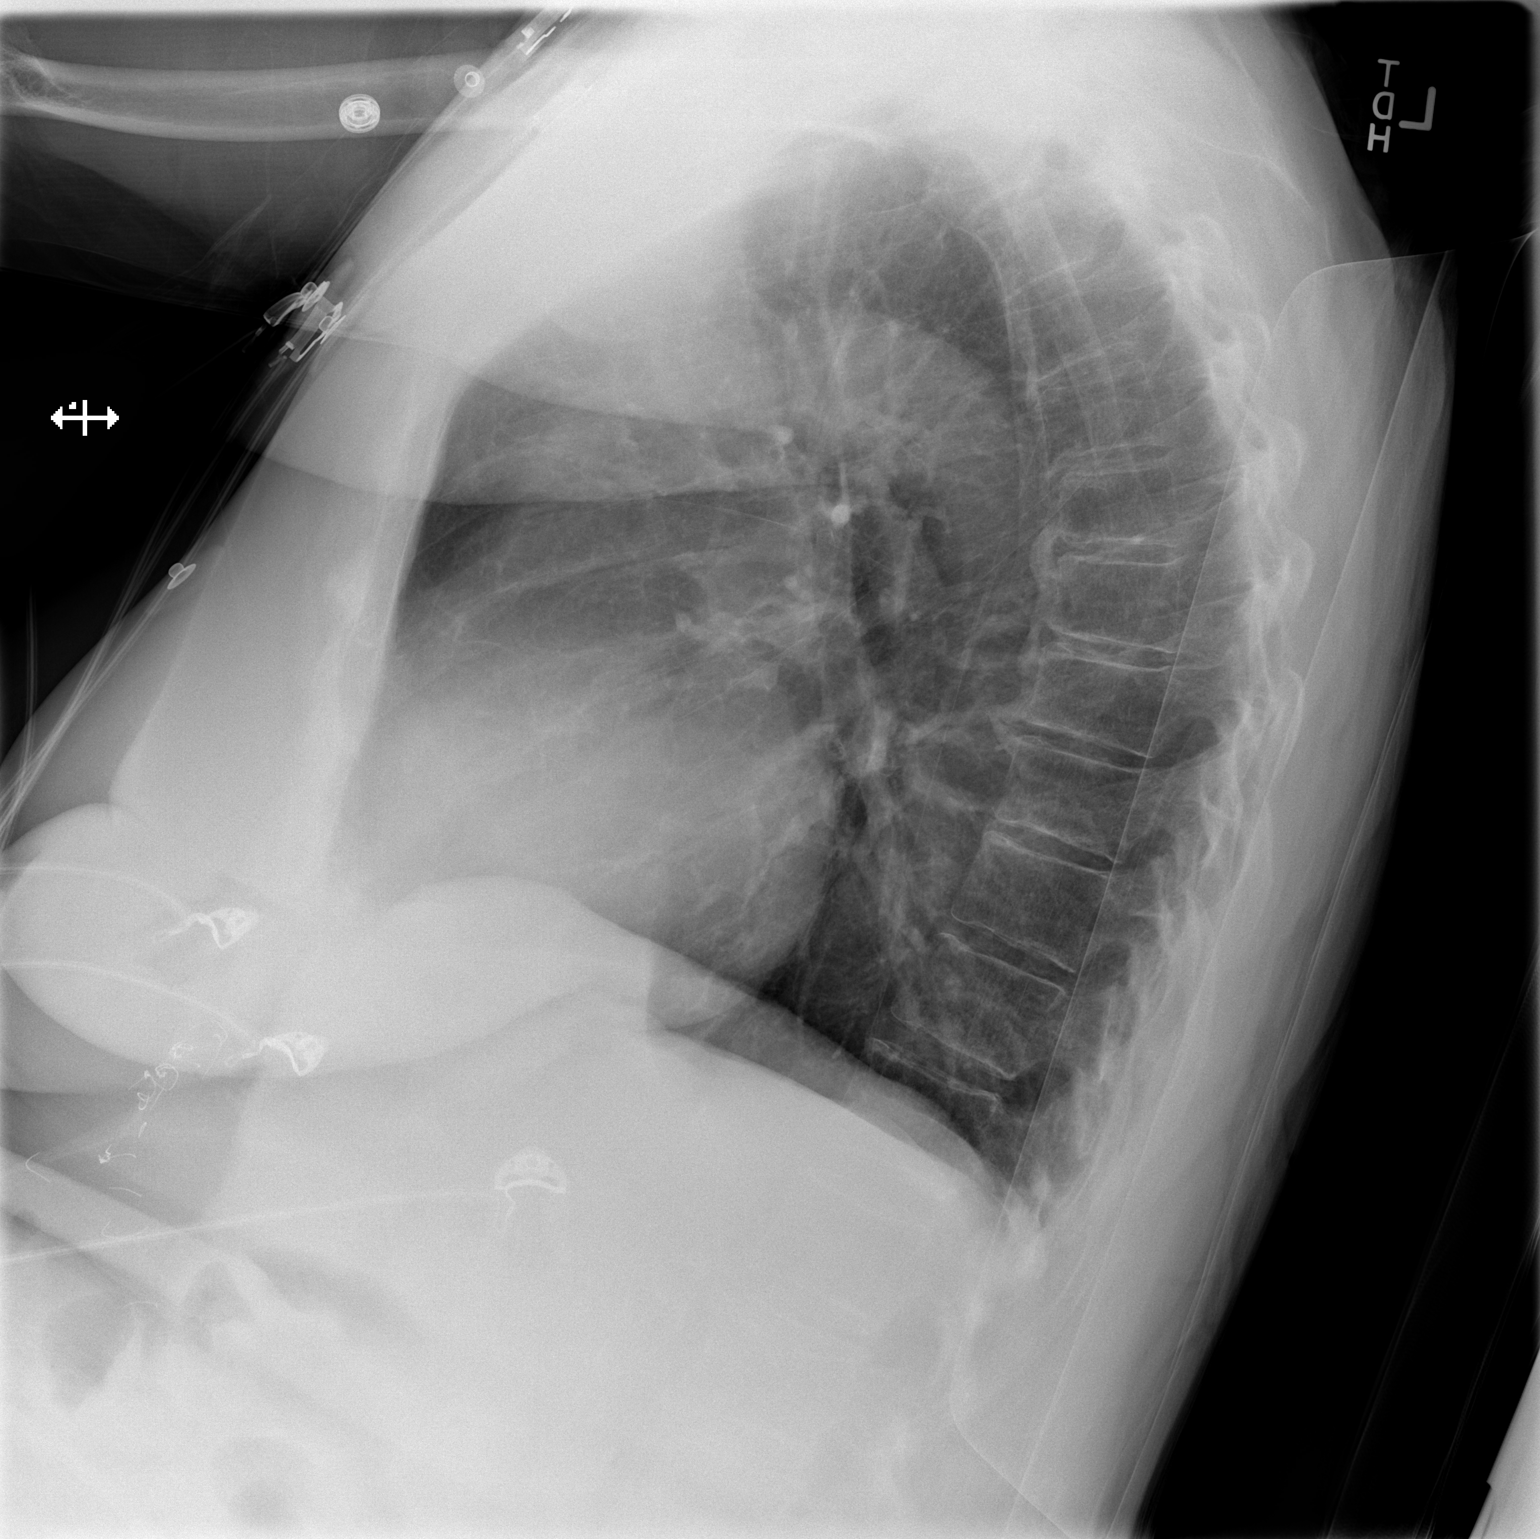

[x chest ap]
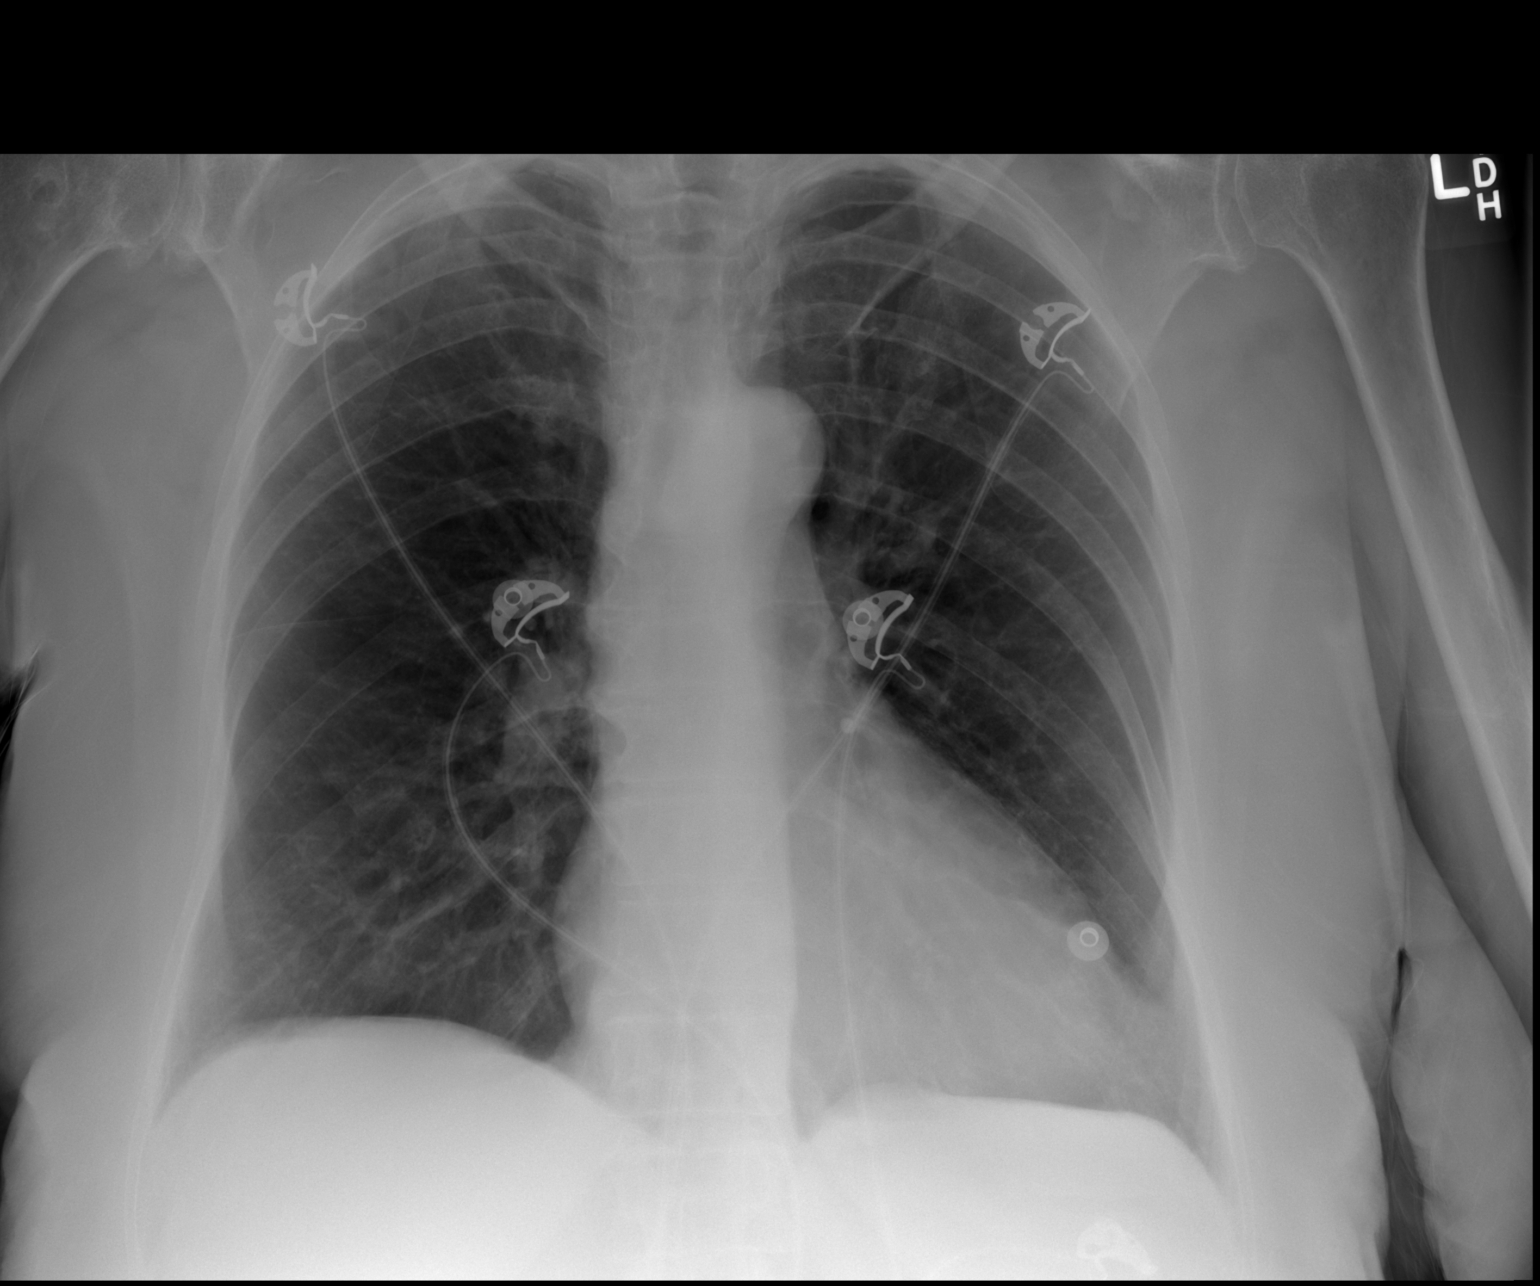

[2 of 2 positions shown; findings below may reference images not displayed]

FINDINGS: The heart size and mediastinal contours are within
normal limits.  Both lungs are clear.  The visualized skeletal
structures are unremarkable.
IMPRESSION: No active cardiopulmonary disease.

## 2013-11-02 IMAGING — CT CT HEAD W/O CM
1 series · 1 of 1 positions shown · non-contrast
Comparison: 09/06/2011 and prior CTs dating back to 11/22/2004.

CLINICAL DATA: 89-year-old female with syncope and headache.

CT HEAD WITHOUT CONTRAST
TECHNIQUE: Contiguous axial images were obtained from the base of
the skull through the vertex without contrast.

[Series 1: brain · coronal · 300.2mm · 0.57mm/px · 1 of 1 slices shown]
[im 1/1]
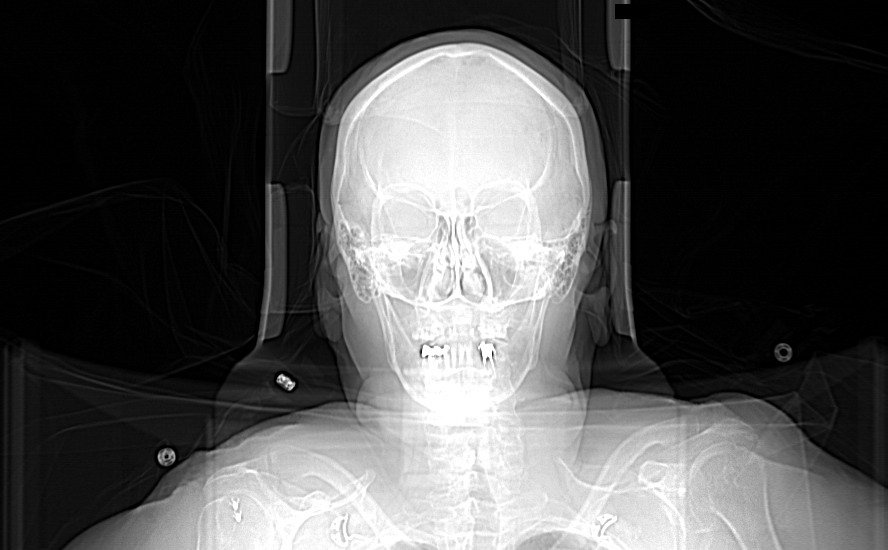

[1 of 1 positions shown; findings below may reference images not displayed]

FINDINGS: Mild generalized cerebral and cerebellar volume loss and
chronic small vessel white matter ischemic changes again noted.

No acute intracranial abnormalities are identified, including mass
lesion or mass effect, hydrocephalus, extra-axial fluid collection,
midline shift, hemorrhage, or acute infarction.

The visualized bony calvarium is unremarkable. A small amount of
mucus/fluid within the left sphenoid sinus noted.
IMPRESSION: No evidence of acute intracranial abnormality.

Mild chronic small vessel white matter ischemic changes.

Mild left sphenoid sinus disease/sinusitis.

## 2013-11-07 ENCOUNTER — Telehealth: Payer: Self-pay

## 2013-11-07 NOTE — Telephone Encounter (Signed)
FYI  We received paperwork from [employer] stating pts son would like a lumbar support back brace for pt.   Per md pt must have a face to face for insurance to approve.  pts son informed and states they have an appt with Selena Batten on 11-14-13.  Paperwork on [employer]

## 2013-11-14 ENCOUNTER — Ambulatory Visit (INDEPENDENT_AMBULATORY_CARE_PROVIDER_SITE_OTHER): Payer: Commercial Managed Care - HMO | Admitting: Physician Assistant

## 2013-11-14 ENCOUNTER — Encounter: Payer: Self-pay | Admitting: Physician Assistant

## 2013-11-14 VITALS — BP 122/82 | HR 52 | Temp 98.0°F | Resp 16 | Ht 65.0 in | Wt 184.8 lb

## 2013-11-14 DIAGNOSIS — M545 Low back pain, unspecified: Secondary | ICD-10-CM

## 2013-11-14 DIAGNOSIS — E038 Other specified hypothyroidism: Secondary | ICD-10-CM | POA: Diagnosis not present

## 2013-11-14 DIAGNOSIS — Z87440 Personal history of urinary (tract) infections: Secondary | ICD-10-CM | POA: Diagnosis not present

## 2013-11-14 DIAGNOSIS — R252 Cramp and spasm: Secondary | ICD-10-CM

## 2013-11-14 LAB — BASIC METABOLIC PANEL
BUN: 11 mg/dL (ref 6–23)
CO2: 28 mEq/L (ref 19–32)
Calcium: 9 mg/dL (ref 8.4–10.5)
Chloride: 107 mEq/L (ref 96–112)
Creat: 0.8 mg/dL (ref 0.50–1.10)
GLUCOSE: 99 mg/dL (ref 70–99)
POTASSIUM: 4.5 meq/L (ref 3.5–5.3)
SODIUM: 142 meq/L (ref 135–145)

## 2013-11-14 NOTE — Progress Notes (Signed)
Patient presents to clinic today with her daughter-in-law to discuss having a lumbar back brace ordered through a medical supply company.  Patient with previous history of lumbar back pain and endorses prior surgery of lower spine but cannot state what surgery she had.  No prior records for review.  Patient also with muscular weakness due to age-related muscular atrophy.  Daughter-in-law feels back brace will be beneficial as patient likes to spend time in outside in her little garden and this will help give her support.   Patient also endorses bilateral muscle cramping.  Is currently on Lisinopril 10 mg daily for hypertension.  Denies history of hyper/hypokalemia.  Does have documented history of RLS but denies tingling/crawling symptoms.   Of note, daughter-in-law notes patient has not been taking her thyroid medication as directed for an unknown duration.  Past Medical History  Diagnosis Date  . Arthritis   . Chicken pox   . Chronic kidney disease   . Colon polyps   . Hypertension   . Dementia   . Angina   . Dysrhythmia   . Heart murmur   . Shortness of breath   . Recurrent upper respiratory infection (URI)   . Pneumonia   . Hypothyroidism   . Anemia   . Headache(784.0)   . Anxiety   . Depression   . Valvular heart disease 09/04/2012  . Left leg pain 10/02/2012  . UTI (urinary tract infection) 12/30/2012  . Acute bronchitis 04/10/2013  . Dehydration 08/31/2013    Current Outpatient Prescriptions on File Prior to Visit  Medication Sig Dispense Refill  . acetaminophen (TYLENOL) 325 MG tablet Take 650 mg by mouth every 6 (six) hours as needed for moderate pain.      Marland Kitchen albuterol (PROVENTIL HFA;VENTOLIN HFA) 108 (90 BASE) MCG/ACT inhaler Inhale 2 puffs into the lungs every 6 (six) hours as needed for wheezing or shortness of breath.  1 Inhaler  0  . guaiFENesin-dextromethorphan (ROBITUSSIN DM) 100-10 MG/5ML syrup Take 5 mLs by mouth every 4 (four) hours as needed for cough.  118 mL  0  .  LORazepam (ATIVAN) 1 MG tablet Take 0.5 mg by mouth every 8 (eight) hours as needed for anxiety.      Marland Kitchen levothyroxine (SYNTHROID, LEVOTHROID) 50 MCG tablet Take 1 tablet (50 mcg total) by mouth daily.  90 tablet  1  . Magnesium 250 MG TABS Take 250 mg by mouth daily.        No current facility-administered medications on file prior to visit.    Allergies  Allergen Reactions  . Codeine Rash  . Hydrocodone Rash    Family History  Problem Relation Age of Onset  . Heart disease Mother   . Heart disease Father   . Heart disease Other   . Birth defects Other     History   Social History  . Marital Status: Widowed    Spouse Name: N/A    Number of Children: N/A  . Years of Education: N/A   Social History Main Topics  . Smoking status: Former Smoker -- 0.20 packs/day for 1 years    Types: Cigarettes    Quit date: 06/02/1941  . Smokeless tobacco: Never Used  . Alcohol Use: No  . Drug Use: No  . Sexual Activity: Not Currently    Birth Control/ Protection: Abstinence   Other Topics Concern  . None   Social History Narrative   Still lives at home and ambulatory daily. Doesn't use cane or walker, but  has cane at home. Remote smoking history.    Review of Systems - See HPI.  All other ROS are negative.  BP 122/82  Pulse 52  Temp(Src) 98 F (36.7 C) (Oral)  Resp 16  Ht 5' 5"  (1.651 m)  Wt 184 lb 12 oz (83.802 kg)  BMI 30.74 kg/m2  SpO2 98%  Physical Exam  Vitals reviewed. Constitutional: She is oriented to person, place, and time and well-developed, well-nourished, and in no distress.  HENT:  Head: Normocephalic and atraumatic.  Eyes: Conjunctivae are normal. Pupils are equal, round, and reactive to light.  Neck: Normal range of motion. Neck supple.  Cardiovascular: Normal rate, regular rhythm, normal heart sounds and intact distal pulses.   Pulmonary/Chest: Effort normal and breath sounds normal.  Abdominal: Soft. Bowel sounds are normal.  Musculoskeletal:        Lumbar back: She exhibits normal range of motion and no bony tenderness.       Right lower leg: She exhibits no tenderness, no swelling and no edema.       Left lower leg: She exhibits no tenderness, no swelling and no edema.  Neurological: She is alert and oriented to person, place, and time.  Skin: Skin is warm and dry. No rash noted.  Psychiatric: Affect normal.    Recent Results (from the past 2160 hour(s))  CBC     Status: Abnormal   Collection Time    09/21/13  5:37 PM      Result Value Ref Range   WBC 8.8  4.0 - 10.5 K/uL   RBC 4.09  3.87 - 5.11 MIL/uL   Hemoglobin 12.1  12.0 - 15.0 g/dL   HCT 35.8 (*) 36.0 - 46.0 %   MCV 87.5  78.0 - 100.0 fL   MCH 29.6  26.0 - 34.0 pg   MCHC 33.8  30.0 - 36.0 g/dL   RDW 12.9  11.5 - 15.5 %   Platelets 232  150 - 400 K/uL  BASIC METABOLIC PANEL     Status: Abnormal   Collection Time    09/21/13  5:37 PM      Result Value Ref Range   Sodium 142  137 - 147 mEq/L   Potassium 4.1  3.7 - 5.3 mEq/L   Chloride 105  96 - 112 mEq/L   CO2 22  19 - 32 mEq/L   Glucose, Bld 124 (*) 70 - 99 mg/dL   BUN 17  6 - 23 mg/dL   Creatinine, Ser 1.00  0.50 - 1.10 mg/dL   Calcium 9.2  8.4 - 10.5 mg/dL   GFR calc non Af Amer 48 (*) >90 mL/min   GFR calc Af Amer 55 (*) >90 mL/min   Comment: (NOTE)     The eGFR has been calculated using the CKD EPI equation.     This calculation has not been validated in all clinical situations.     eGFR's persistently <90 mL/min signify possible Chronic Kidney     Disease.  Randolm Idol, ED     Status: None   Collection Time    09/21/13  6:20 PM      Result Value Ref Range   Troponin i, poc 0.00  0.00 - 0.08 ng/mL   Comment 3            Comment: Due to the release kinetics of cTnI,     a negative result within the first hours     of the onset of symptoms does not  rule out     myocardial infarction with certainty.     If myocardial infarction is still suspected,     repeat the test at appropriate intervals.  TSH      Status: None   Collection Time    09/23/13  2:16 AM      Result Value Ref Range   TSH 0.758  0.350 - 4.500 uIU/mL  CBG MONITORING, ED     Status: Abnormal   Collection Time    10/01/13  9:12 PM      Result Value Ref Range   Glucose-Capillary 110 (*) 70 - 99 mg/dL   Comment 1 Notify RN    APTT     Status: None   Collection Time    10/01/13  9:18 PM      Result Value Ref Range   aPTT 27  24 - 37 seconds  CBC WITH DIFFERENTIAL     Status: Abnormal   Collection Time    10/01/13  9:18 PM      Result Value Ref Range   WBC 8.3  4.0 - 10.5 K/uL   RBC 4.05  3.87 - 5.11 MIL/uL   Hemoglobin 12.2  12.0 - 15.0 g/dL   HCT 34.4 (*) 36.0 - 46.0 %   MCV 84.9  78.0 - 100.0 fL   MCH 30.1  26.0 - 34.0 pg   MCHC 35.5  30.0 - 36.0 g/dL   RDW 12.7  11.5 - 15.5 %   Platelets 251  150 - 400 K/uL   Neutrophils Relative % 69  43 - 77 %   Neutro Abs 5.7  1.7 - 7.7 K/uL   Lymphocytes Relative 25  12 - 46 %   Lymphs Abs 2.1  0.7 - 4.0 K/uL   Monocytes Relative 5  3 - 12 %   Monocytes Absolute 0.4  0.1 - 1.0 K/uL   Eosinophils Relative 1  0 - 5 %   Eosinophils Absolute 0.1  0.0 - 0.7 K/uL   Basophils Relative 0  0 - 1 %   Basophils Absolute 0.0  0.0 - 0.1 K/uL  COMPREHENSIVE METABOLIC PANEL     Status: Abnormal   Collection Time    10/01/13  9:18 PM      Result Value Ref Range   Sodium 144  137 - 147 mEq/L   Potassium 3.7  3.7 - 5.3 mEq/L   Chloride 107  96 - 112 mEq/L   CO2 20  19 - 32 mEq/L   Glucose, Bld 110 (*) 70 - 99 mg/dL   BUN 13  6 - 23 mg/dL   Creatinine, Ser 1.08  0.50 - 1.10 mg/dL   Calcium 9.3  8.4 - 10.5 mg/dL   Total Protein 7.0  6.0 - 8.3 g/dL   Albumin 3.5  3.5 - 5.2 g/dL   AST 35  0 - 37 U/L   ALT 40 (*) 0 - 35 U/L   Alkaline Phosphatase 77  39 - 117 U/L   Total Bilirubin 0.7  0.3 - 1.2 mg/dL   GFR calc non Af Amer 44 (*) >90 mL/min   GFR calc Af Amer 50 (*) >90 mL/min   Comment: (NOTE)     The eGFR has been calculated using the CKD EPI equation.     This  calculation has not been validated in all clinical situations.     eGFR's persistently <90 mL/min signify possible Chronic Kidney     Disease.  PROTIME-INR  Status: None   Collection Time    10/01/13  9:18 PM      Result Value Ref Range   Prothrombin Time 12.5  11.6 - 15.2 seconds   INR 0.95  0.00 - 1.49  ETHANOL     Status: None   Collection Time    10/01/13  9:18 PM      Result Value Ref Range   Alcohol, Ethyl (B) <11  0 - 11 mg/dL   Comment:            LOWEST DETECTABLE LIMIT FOR     SERUM ALCOHOL IS 11 mg/dL     FOR MEDICAL PURPOSES ONLY  AMMONIA     Status: None   Collection Time    10/01/13  9:33 PM      Result Value Ref Range   Ammonia 26  11 - 60 umol/L  I-STAT VENOUS BLOOD GAS, ED     Status: Abnormal   Collection Time    10/01/13  9:47 PM      Result Value Ref Range   pH, Ven 7.456 (*) 7.250 - 7.300   pCO2, Ven 30.7 (*) 45.0 - 50.0 mmHg   pO2, Ven 39.0  30.0 - 45.0 mmHg   Bicarbonate 21.6  20.0 - 24.0 mEq/L   TCO2 23  0 - 100 mmol/L   O2 Saturation 77.0     Acid-base deficit 1.0  0.0 - 2.0 mmol/L   Patient temperature 98.7 F     Sample type VENOUS     Comment NOTIFIED PHYSICIAN    URINALYSIS, ROUTINE W REFLEX MICROSCOPIC     Status: Abnormal   Collection Time    10/02/13  4:23 AM      Result Value Ref Range   Color, Urine YELLOW  YELLOW   APPearance CLOUDY (*) CLEAR   Specific Gravity, Urine 1.016  1.005 - 1.030   pH 5.5  5.0 - 8.0   Glucose, UA NEGATIVE  NEGATIVE mg/dL   Hgb urine dipstick NEGATIVE  NEGATIVE   Bilirubin Urine NEGATIVE  NEGATIVE   Ketones, ur NEGATIVE  NEGATIVE mg/dL   Protein, ur NEGATIVE  NEGATIVE mg/dL   Urobilinogen, UA 0.2  0.0 - 1.0 mg/dL   Nitrite NEGATIVE  NEGATIVE   Leukocytes, UA MODERATE (*) NEGATIVE  URINE RAPID DRUG SCREEN (HOSP PERFORMED)     Status: None   Collection Time    10/02/13  4:23 AM      Result Value Ref Range   Opiates NONE DETECTED  NONE DETECTED   Cocaine NONE DETECTED  NONE DETECTED   Benzodiazepines  NONE DETECTED  NONE DETECTED   Amphetamines NONE DETECTED  NONE DETECTED   Tetrahydrocannabinol NONE DETECTED  NONE DETECTED   Barbiturates NONE DETECTED  NONE DETECTED   Comment:            DRUG SCREEN FOR MEDICAL PURPOSES     ONLY.  IF CONFIRMATION IS NEEDED     FOR ANY PURPOSE, NOTIFY LAB     WITHIN 5 DAYS.                LOWEST DETECTABLE LIMITS     FOR URINE DRUG SCREEN     Drug Class       Cutoff (ng/mL)     Amphetamine      1000     Barbiturate      200     Benzodiazepine   060     Tricyclics  300     Opiates          300     Cocaine          300     THC              50  URINE MICROSCOPIC-ADD ON     Status: Abnormal   Collection Time    10/02/13  4:23 AM      Result Value Ref Range   Squamous Epithelial / LPF FEW (*) RARE   WBC, UA 11-20  <3 WBC/hpf   RBC / HPF 0-2  <3 RBC/hpf   Bacteria, UA FEW (*) RARE   Urine-Other MANY YEAST    GLUCOSE, CAPILLARY     Status: Abnormal   Collection Time    10/02/13  7:00 AM      Result Value Ref Range   Glucose-Capillary 101 (*) 70 - 99 mg/dL   Comment 1 Documented in Chart     Comment 2 Notify RN    HEMOGLOBIN A1C     Status: Abnormal   Collection Time    10/02/13  7:27 AM      Result Value Ref Range   Hemoglobin A1C 6.2 (*) <5.7 %   Comment: (NOTE)                                                                               According to the ADA Clinical Practice Recommendations for 2011, when     HbA1c is used as a screening test:      >=6.5%   Diagnostic of Diabetes Mellitus               (if abnormal result is confirmed)     5.7-6.4%   Increased risk of developing Diabetes Mellitus     References:Diagnosis and Classification of Diabetes Mellitus,Diabetes     RPRX,4585,92(TWKMQ 1):S62-S69 and Standards of Medical Care in             Diabetes - 2011,Diabetes Care,2011,34 (Suppl 1):S11-S61.   Mean Plasma Glucose 131 (*) <117 mg/dL   Comment: Performed at [facility]     Status: Abnormal    Collection Time    10/02/13  7:27 AM      Result Value Ref Range   Cholesterol 155  0 - 200 mg/dL   Triglycerides 181 (*) <150 mg/dL   HDL 33 (*) >39 mg/dL   Total CHOL/HDL Ratio 4.7     VLDL 36  0 - 40 mg/dL   LDL Cholesterol 86  0 - 99 mg/dL   Comment:            Total Cholesterol/HDL:CHD Risk     Coronary Heart Disease Risk Table                         Men   Women      1/2 Average Risk   3.4   3.3      Average Risk       5.0   4.4      2 X Average Risk   9.6   7.1  3 X Average Risk  23.4   11.0                Use the calculated Patient Ratio     above and the CHD Risk Table     to determine the patient's CHD Risk.                ATP III CLASSIFICATION (LDL):      <100     mg/dL   Optimal      100-129  mg/dL   Near or Above                        Optimal      130-159  mg/dL   Borderline      160-189  mg/dL   High      >190     mg/dL   Very High  GLUCOSE, CAPILLARY     Status: Abnormal   Collection Time    10/03/13  9:18 PM      Result Value Ref Range   Glucose-Capillary 123 (*) 70 - 99 mg/dL   Comment 1 Notify RN     Comment 2 Documented in Chart    URINE CULTURE     Status: None   Collection Time    10/04/13  7:07 AM      Result Value Ref Range   Specimen Description URINE, RANDOM     Special Requests NONE     Culture  Setup Time       Value: 10/04/2013 07:29     Performed at [facility]       Value: NO GROWTH     Performed at [facility]   Culture       Value: NO GROWTH     Performed at [facility]   Report Status 10/05/2013 FINAL    URINALYSIS, ROUTINE W REFLEX MICROSCOPIC     Status: Abnormal   Collection Time    10/26/13  6:21 PM      Result Value Ref Range   Color, Urine YELLOW  YELLOW   APPearance CLEAR  CLEAR   Specific Gravity, Urine 1.020  1.005 - 1.030   pH 5.5  5.0 - 8.0   Glucose, UA NEG  NEG mg/dL   Bilirubin Urine NEG  NEG   Ketones, ur NEG  NEG mg/dL   Hgb urine dipstick TRACE (*) NEG    Protein, ur NEG  NEG mg/dL   Urobilinogen, UA 0.2  0.0 - 1.0 mg/dL   Nitrite NEG  NEG   Leukocytes, UA SMALL (*) NEG  URINALYSIS, MICROSCOPIC ONLY     Status: Abnormal   Collection Time    10/26/13  6:21 PM      Result Value Ref Range   Squamous Epithelial / LPF NONE SEEN  RARE   Crystals Y  NONE SEEN   Casts NONE SEEN  NONE SEEN   WBC, UA 7-10 (*) <3 WBC/hpf   RBC / HPF 0-2  <3 RBC/hpf   Bacteria, UA FEW (*) RARE   Urine-Other SEE NOTE     Comment: Many yeast  POCT URINALYSIS DIPSTICK     Status: None   Collection Time    10/26/13  6:25 PM      Result Value Ref Range   Color, UA straw     Clarity, UA cloudy     Glucose, UA neg  Bilirubin, UA neg     Ketones, UA neg     Spec Grav, UA 1.025     Blood, UA small, hemolyzed     pH, UA 5.0     Protein, UA neg     Urobilinogen, UA 0.2     Nitrite, UA neg     Leukocytes, UA Negative    BASIC METABOLIC PANEL     Status: None   Collection Time    11/14/13 10:57 AM      Result Value Ref Range   Sodium 142  135 - 145 mEq/L   Potassium 4.5  3.5 - 5.3 mEq/L   Chloride 107  96 - 112 mEq/L   CO2 28  19 - 32 mEq/L   Glucose, Bld 99  70 - 99 mg/dL   BUN 11  6 - 23 mg/dL   Creat 0.80  0.50 - 1.10 mg/dL   Calcium 9.0  8.4 - 10.5 mg/dL  URINALYSIS W MICROSCOPIC + REFLEX CULTURE     Status: Abnormal   Collection Time    11/14/13 10:57 AM      Result Value Ref Range   Color, Urine YELLOW  YELLOW   APPearance CLEAR  CLEAR   Specific Gravity, Urine 1.015  1.005 - 1.030   pH 5.5  5.0 - 8.0   Glucose, UA NEG  NEG mg/dL   Bilirubin Urine NEG  NEG   Ketones, ur NEG  NEG mg/dL   Hgb urine dipstick NEG  NEG   Protein, ur NEG  NEG mg/dL   Urobilinogen, UA 0.2  0.0 - 1.0 mg/dL   Nitrite NEG  NEG   Leukocytes, UA SMALL (*) NEG   Squamous Epithelial / LPF NONE SEEN  RARE   Crystals NONE SEEN  NONE SEEN   Casts NONE SEEN  NONE SEEN   WBC, UA 11-20 (*) <3 WBC/hpf   RBC / HPF 0-2  <3 RBC/hpf   Bacteria, UA NONE SEEN  RARE  TSH      Status: None   Collection Time    11/14/13 10:57 AM      Result Value Ref Range   TSH 4.036  0.350 - 4.500 uIU/mL  URINE CULTURE     Status: None   Collection Time    11/14/13 10:57 AM      Result Value Ref Range   Colony Count >=100,000 COLONIES/ML     Organism ID, Bacteria CITROBACTER FREUNDII    CBC     Status: Abnormal   Collection Time    11/18/13  4:57 PM      Result Value Ref Range   WBC 8.2  4.0 - 10.5 K/uL   RBC 4.04  3.87 - 5.11 MIL/uL   Hemoglobin 11.8 (*) 12.0 - 15.0 g/dL   HCT 34.7 (*) 36.0 - 46.0 %   MCV 85.9  78.0 - 100.0 fL   MCH 29.2  26.0 - 34.0 pg   MCHC 34.0  30.0 - 36.0 g/dL   RDW 13.8  11.5 - 15.5 %   Platelets 239  150 - 400 K/uL  COMPREHENSIVE METABOLIC PANEL     Status: None   Collection Time    11/18/13  4:57 PM      Result Value Ref Range   Sodium 141  135 - 145 mEq/L   Potassium 4.1  3.5 - 5.3 mEq/L   Chloride 104  96 - 112 mEq/L   CO2 27  19 - 32 mEq/L  Glucose, Bld 92  70 - 99 mg/dL   BUN 12  6 - 23 mg/dL   Creat 0.88  0.50 - 1.10 mg/dL   Total Bilirubin 0.5  0.2 - 1.2 mg/dL   Alkaline Phosphatase 72  39 - 117 U/L   AST 16  0 - 37 U/L   ALT 15  0 - 35 U/L   Total Protein 6.7  6.0 - 8.3 g/dL   Albumin 3.8  3.5 - 5.2 g/dL   Calcium 9.3  8.4 - 10.5 mg/dL  SEDIMENTATION RATE     Status: None   Collection Time    11/18/13  4:57 PM      Result Value Ref Range   Sed Rate 7  0 - 22 mm/hr   Assessment/Plan: Muscle cramps Will obtain CBC and CMP.  Encourage good fluid intake.  Stretching exercises.  Topical Aspercreme.   Low back pain, episodic Chronic.  Will fill out forms requested for lumbar support and fax them to medical supply.

## 2013-11-14 NOTE — Progress Notes (Signed)
Pre visit review using our clinic review tool, if applicable. No additional management support is needed unless otherwise documented below in the visit note/SLS  

## 2013-11-14 NOTE — Patient Instructions (Addendum)
Please continue medications as directed.  I will call you with your lab results.  I will also call you once I have contacted the medical supply person for your back brace.  Remember to use your cane!

## 2013-11-15 LAB — URINALYSIS W MICROSCOPIC + REFLEX CULTURE
Bacteria, UA: NONE SEEN
Bilirubin Urine: NEGATIVE
CASTS: NONE SEEN
Crystals: NONE SEEN
Glucose, UA: NEGATIVE mg/dL
HGB URINE DIPSTICK: NEGATIVE
Ketones, ur: NEGATIVE mg/dL
Nitrite: NEGATIVE
PH: 5.5 (ref 5.0–8.0)
Protein, ur: NEGATIVE mg/dL
SPECIFIC GRAVITY, URINE: 1.015 (ref 1.005–1.030)
SQUAMOUS EPITHELIAL / LPF: NONE SEEN
Urobilinogen, UA: 0.2 mg/dL (ref 0.0–1.0)

## 2013-11-15 LAB — TSH: TSH: 4.036 u[IU]/mL (ref 0.350–4.500)

## 2013-11-15 NOTE — Progress Notes (Signed)
Gave this information to Katrina w/[facility]

## 2013-11-17 ENCOUNTER — Telehealth: Payer: Self-pay | Admitting: Family Medicine

## 2013-11-17 DIAGNOSIS — N390 Urinary tract infection, site not specified: Secondary | ICD-10-CM

## 2013-11-17 MED ORDER — AMOXICILLIN-POT CLAVULANATE 875-125 MG PO TABS: 1.0000 | ORAL_TABLET | Freq: Two times a day (BID) | ORAL | Status: DC

## 2013-11-17 NOTE — Telephone Encounter (Signed)
Discussed with patient's daughter-in-law.  Patient doing fine.  No fall. No head trauma. No AMS.  Ambulating without difficulty.  Possibly just fell asleep.  Appointment scheduled for tomorrow at 4 pm.

## 2013-11-17 NOTE — Telephone Encounter (Signed)
Daughter in law Orson ApeJoanne Blake called to state Joylene GrapesStella Broadwater started back on her medicine and is complaining of cramps again.  Also she passed out yesterday.  Daughter in law would like to talk to someone about this  Her number is [phone]

## 2013-11-18 ENCOUNTER — Ambulatory Visit (HOSPITAL_BASED_OUTPATIENT_CLINIC_OR_DEPARTMENT_OTHER)
Admission: RE | Admit: 2013-11-18 | Discharge: 2013-11-18 | Disposition: A | Discharge status: Home / Self Care | Payer: Medicare HMO | Source: Ambulatory Visit | Attending: Physician Assistant | Admitting: Physician Assistant

## 2013-11-18 ENCOUNTER — Encounter: Payer: Self-pay | Admitting: Physician Assistant

## 2013-11-18 ENCOUNTER — Ambulatory Visit (INDEPENDENT_AMBULATORY_CARE_PROVIDER_SITE_OTHER): Payer: Medicare HMO | Admitting: Physician Assistant

## 2013-11-18 VITALS — BP 130/70 | HR 57 | Temp 98.0°F | Resp 14 | Ht 65.0 in | Wt 187.8 lb

## 2013-11-18 DIAGNOSIS — R05 Cough: Secondary | ICD-10-CM

## 2013-11-18 DIAGNOSIS — R55 Syncope and collapse: Secondary | ICD-10-CM

## 2013-11-18 DIAGNOSIS — F0390 Unspecified dementia without behavioral disturbance: Secondary | ICD-10-CM

## 2013-11-18 DIAGNOSIS — R51 Headache: Secondary | ICD-10-CM

## 2013-11-18 DIAGNOSIS — M79609 Pain in unspecified limb: Secondary | ICD-10-CM

## 2013-11-18 DIAGNOSIS — R059 Cough, unspecified: Secondary | ICD-10-CM

## 2013-11-18 DIAGNOSIS — H539 Unspecified visual disturbance: Secondary | ICD-10-CM

## 2013-11-18 DIAGNOSIS — M79662 Pain in left lower leg: Secondary | ICD-10-CM

## 2013-11-18 DIAGNOSIS — R519 Headache, unspecified: Secondary | ICD-10-CM

## 2013-11-18 DIAGNOSIS — I10 Essential (primary) hypertension: Secondary | ICD-10-CM

## 2013-11-18 DIAGNOSIS — R252 Cramp and spasm: Secondary | ICD-10-CM

## 2013-11-18 DIAGNOSIS — N3000 Acute cystitis without hematuria: Secondary | ICD-10-CM

## 2013-11-18 LAB — COMPREHENSIVE METABOLIC PANEL
ALT: 15 U/L (ref 0–35)
AST: 16 U/L (ref 0–37)
Albumin: 3.8 g/dL (ref 3.5–5.2)
Alkaline Phosphatase: 72 U/L (ref 39–117)
BILIRUBIN TOTAL: 0.5 mg/dL (ref 0.2–1.2)
BUN: 12 mg/dL (ref 6–23)
CALCIUM: 9.3 mg/dL (ref 8.4–10.5)
CHLORIDE: 104 meq/L (ref 96–112)
CO2: 27 meq/L (ref 19–32)
CREATININE: 0.88 mg/dL (ref 0.50–1.10)
Glucose, Bld: 92 mg/dL (ref 70–99)
Potassium: 4.1 mEq/L (ref 3.5–5.3)
Sodium: 141 mEq/L (ref 135–145)
Total Protein: 6.7 g/dL (ref 6.0–8.3)

## 2013-11-18 LAB — SEDIMENTATION RATE: Sed Rate: 7 mm/hr (ref 0–22)

## 2013-11-18 LAB — URINE CULTURE: Colony Count: >=100000

## 2013-11-18 LAB — CBC
HCT: 34.7 % — ABNORMAL LOW (ref 36.0–46.0)
HEMOGLOBIN: 11.8 g/dL — AB (ref 12.0–15.0)
MCH: 29.2 pg (ref 26.0–34.0)
MCHC: 34 g/dL (ref 30.0–36.0)
MCV: 85.9 fL (ref 78.0–100.0)
Platelets: 239 10*3/uL (ref 150–400)
RBC: 4.04 MIL/uL (ref 3.87–5.11)
RDW: 13.8 % (ref 11.5–15.5)
WBC: 8.2 10*3/uL (ref 4.0–10.5)

## 2013-11-18 MED ORDER — LOSARTAN POTASSIUM 50 MG PO TABS: 25.0000 mg | ORAL_TABLET | Freq: Every day | ORAL | Status: DC

## 2013-11-18 MED ORDER — CIPROFLOXACIN HCL 500 MG PO TABS: 500.0000 mg | ORAL_TABLET | Freq: Two times a day (BID) | ORAL | Status: DC

## 2013-11-18 NOTE — Patient Instructions (Signed)
Please take antibiotic (Ciprofloxacin) as directed.  Stay well hydrated. Take a cranberry supplement.   For blood pressure -- stop the lisinopril.  Start the losartan as directed - 1/2 tablet daily.  Follow-up in 1 week.    I am glad you are feeling better and your exam looks good.  Because of your possible syncopal event, I am ordering a few imaging studies.  You will be contacted Monday for the US and the CT scan.  If you develop any lightheadedness, dizziness or severe headache, please call 911 or proceed to the ER.  I will work on having Home Health come out.

## 2013-11-20 DIAGNOSIS — M545 Low back pain, unspecified: Secondary | ICD-10-CM | POA: Insufficient documentation

## 2013-11-20 DIAGNOSIS — R252 Cramp and spasm: Secondary | ICD-10-CM | POA: Insufficient documentation

## 2013-11-20 NOTE — Assessment & Plan Note (Signed)
Chronic.  Will fill out forms requested for lumbar support and fax them to medical supply.

## 2013-11-20 NOTE — Assessment & Plan Note (Signed)
Will obtain CBC and CMP.  Encourage good fluid intake.  Stretching exercises.  Topical Aspercreme.

## 2013-11-20 NOTE — Assessment & Plan Note (Signed)
Repeat TSH

## 2013-11-21 ENCOUNTER — Ambulatory Visit (HOSPITAL_BASED_OUTPATIENT_CLINIC_OR_DEPARTMENT_OTHER)
Admission: RE | Admit: 2013-11-21 | Discharge: 2013-11-21 | Disposition: A | Discharge status: Home / Self Care | Payer: Medicare HMO | Source: Ambulatory Visit | Attending: Physician Assistant | Admitting: Physician Assistant

## 2013-11-21 DIAGNOSIS — M79609 Pain in unspecified limb: Secondary | ICD-10-CM | POA: Insufficient documentation

## 2013-11-21 DIAGNOSIS — R609 Edema, unspecified: Secondary | ICD-10-CM | POA: Insufficient documentation

## 2013-11-21 DIAGNOSIS — R519 Headache, unspecified: Secondary | ICD-10-CM

## 2013-11-21 DIAGNOSIS — R51 Headache: Secondary | ICD-10-CM

## 2013-11-21 DIAGNOSIS — H539 Unspecified visual disturbance: Secondary | ICD-10-CM

## 2013-11-21 DIAGNOSIS — M79662 Pain in left lower leg: Secondary | ICD-10-CM

## 2013-11-21 DIAGNOSIS — R55 Syncope and collapse: Secondary | ICD-10-CM

## 2013-11-21 NOTE — Progress Notes (Signed)
Patient presents to clinic today for evaluation of a possible syncopal episode 2 days ago.   Patient states she was working on something in her room at the foot of her bed, and the next thing she remembers, she was waking up on the floor.  States she couldn't have been "out" for more than a couple of minutes.  States she felt very sleepy afterwards.  Denies feeling lightheaded or dizzy before event.  Denies noting chest pain, palpitations, SOB, nausea or vomiting. Patient states she has felt fine since the incident. Denies lightheadedness, dizziness, vision changes.  Denies chest pain or palpitations. Does endorses occasional "pressure" in her head.  Patient recently had MRI and MRA of the brain that was unremarkable.  Patient also Echocardiogram and Carotid dopplers that were unremarkable.  Patient endorses taking medications as directed.    Patient continues to complain that her lisinopril is causing muscle cramping in her leg.  States cramps occur an hour after taking medication.   Past Medical History  Diagnosis Date  . Arthritis   . Chicken pox   . Chronic kidney disease   . Colon polyps   . Hypertension   . Dementia   . Angina   . Dysrhythmia   . Heart murmur   . Shortness of breath   . Recurrent upper respiratory infection (URI)   . Pneumonia   . Hypothyroidism   . Anemia   . Headache(784.0)   . Anxiety   . Depression   . Valvular heart disease 09/04/2012  . Left leg pain 10/02/2012  . UTI (urinary tract infection) 12/30/2012  . Acute bronchitis 04/10/2013  . Dehydration 08/31/2013    Current Outpatient Prescriptions on File Prior to Visit  Medication Sig Dispense Refill  . acetaminophen (TYLENOL) 325 MG tablet Take 650 mg by mouth every 6 (six) hours as needed for moderate pain.      Marland Kitchen albuterol (PROVENTIL HFA;VENTOLIN HFA) 108 (90 BASE) MCG/ACT inhaler Inhale 2 puffs into the lungs every 6 (six) hours as needed for wheezing or shortness of breath.  1 Inhaler  0  .  guaiFENesin-dextromethorphan (ROBITUSSIN DM) 100-10 MG/5ML syrup Take 5 mLs by mouth every 4 (four) hours as needed for cough.  118 mL  0  . levothyroxine (SYNTHROID, LEVOTHROID) 50 MCG tablet Take 1 tablet (50 mcg total) by mouth daily.  90 tablet  1  . LORazepam (ATIVAN) 1 MG tablet Take 0.5 mg by mouth every 8 (eight) hours as needed for anxiety.      . Magnesium 250 MG TABS Take 250 mg by mouth daily.        No current facility-administered medications on file prior to visit.    Allergies  Allergen Reactions  . Codeine Rash  . Hydrocodone Rash    Family History  Problem Relation Age of Onset  . Heart disease Mother   . Heart disease Father   . Heart disease Other   . Birth defects Other     History   Social History  . Marital Status: Widowed    Spouse Name: N/A    Number of Children: N/A  . Years of Education: N/A   Social History Main Topics  . Smoking status: Former Smoker -- 0.20 packs/day for 1 years    Types: Cigarettes    Quit date: 06/02/1941  . Smokeless tobacco: Never Used  . Alcohol Use: No  . Drug Use: No  . Sexual Activity: Not Currently    Birth Control/ Protection: Abstinence  Other Topics Concern  . None   Social History Narrative   Still lives at home and ambulatory daily. Doesn't use cane or walker, but has cane at home. Remote smoking history.     Review of Systems - See HPI.  All other ROS are negative.  BP 130/70  Pulse 57  Temp(Src) 98 F (36.7 C) (Oral)  Resp 14  Ht 5' 5"  (1.651 m)  Wt 187 lb 12 oz (85.163 kg)  BMI 31.24 kg/m2  SpO2 97%  Physical Exam  Vitals reviewed. Constitutional: She is well-developed, well-nourished, and in no distress.  HENT:  Head: Normocephalic and atraumatic.  Eyes: Conjunctivae and EOM are normal. Pupils are equal, round, and reactive to light.  Neck: Neck supple.  Cardiovascular: Normal rate, regular rhythm, normal heart sounds and intact distal pulses.   Pulmonary/Chest: Effort normal and  breath sounds normal. No respiratory distress. She has no wheezes. She has no rales. She exhibits no tenderness.  Neurological: No cranial nerve deficit. Gait normal. GCS score is 15.  Alert and oriented to person and place, but not time.  Skin: Skin is warm and dry. No rash noted.  Psychiatric: Affect normal.    Recent Results (from the past 2160 hour(s))  CBC     Status: Abnormal   Collection Time    09/21/13  5:37 PM      Result Value Ref Range   WBC 8.8  4.0 - 10.5 K/uL   RBC 4.09  3.87 - 5.11 MIL/uL   Hemoglobin 12.1  12.0 - 15.0 g/dL   HCT 35.8 (*) 36.0 - 46.0 %   MCV 87.5  78.0 - 100.0 fL   MCH 29.6  26.0 - 34.0 pg   MCHC 33.8  30.0 - 36.0 g/dL   RDW 12.9  11.5 - 15.5 %   Platelets 232  150 - 400 K/uL  BASIC METABOLIC PANEL     Status: Abnormal   Collection Time    09/21/13  5:37 PM      Result Value Ref Range   Sodium 142  137 - 147 mEq/L   Potassium 4.1  3.7 - 5.3 mEq/L   Chloride 105  96 - 112 mEq/L   CO2 22  19 - 32 mEq/L   Glucose, Bld 124 (*) 70 - 99 mg/dL   BUN 17  6 - 23 mg/dL   Creatinine, Ser 1.00  0.50 - 1.10 mg/dL   Calcium 9.2  8.4 - 10.5 mg/dL   GFR calc non Af Amer 48 (*) >90 mL/min   GFR calc Af Amer 55 (*) >90 mL/min   Comment: (NOTE)     The eGFR has been calculated using the CKD EPI equation.     This calculation has not been validated in all clinical situations.     eGFR's persistently <90 mL/min signify possible Chronic Kidney     Disease.  Randolm Idol, ED     Status: None   Collection Time    09/21/13  6:20 PM      Result Value Ref Range   Troponin i, poc 0.00  0.00 - 0.08 ng/mL   Comment 3            Comment: Due to the release kinetics of cTnI,     a negative result within the first hours     of the onset of symptoms does not rule out     myocardial infarction with certainty.     If myocardial infarction is still  suspected,     repeat the test at appropriate intervals.  TSH     Status: None   Collection Time    09/23/13  2:16  AM      Result Value Ref Range   TSH 0.758  0.350 - 4.500 uIU/mL  CBG MONITORING, ED     Status: Abnormal   Collection Time    10/01/13  9:12 PM      Result Value Ref Range   Glucose-Capillary 110 (*) 70 - 99 mg/dL   Comment 1 Notify RN    APTT     Status: None   Collection Time    10/01/13  9:18 PM      Result Value Ref Range   aPTT 27  24 - 37 seconds  CBC WITH DIFFERENTIAL     Status: Abnormal   Collection Time    10/01/13  9:18 PM      Result Value Ref Range   WBC 8.3  4.0 - 10.5 K/uL   RBC 4.05  3.87 - 5.11 MIL/uL   Hemoglobin 12.2  12.0 - 15.0 g/dL   HCT 34.4 (*) 36.0 - 46.0 %   MCV 84.9  78.0 - 100.0 fL   MCH 30.1  26.0 - 34.0 pg   MCHC 35.5  30.0 - 36.0 g/dL   RDW 12.7  11.5 - 15.5 %   Platelets 251  150 - 400 K/uL   Neutrophils Relative % 69  43 - 77 %   Neutro Abs 5.7  1.7 - 7.7 K/uL   Lymphocytes Relative 25  12 - 46 %   Lymphs Abs 2.1  0.7 - 4.0 K/uL   Monocytes Relative 5  3 - 12 %   Monocytes Absolute 0.4  0.1 - 1.0 K/uL   Eosinophils Relative 1  0 - 5 %   Eosinophils Absolute 0.1  0.0 - 0.7 K/uL   Basophils Relative 0  0 - 1 %   Basophils Absolute 0.0  0.0 - 0.1 K/uL  COMPREHENSIVE METABOLIC PANEL     Status: Abnormal   Collection Time    10/01/13  9:18 PM      Result Value Ref Range   Sodium 144  137 - 147 mEq/L   Potassium 3.7  3.7 - 5.3 mEq/L   Chloride 107  96 - 112 mEq/L   CO2 20  19 - 32 mEq/L   Glucose, Bld 110 (*) 70 - 99 mg/dL   BUN 13  6 - 23 mg/dL   Creatinine, Ser 1.08  0.50 - 1.10 mg/dL   Calcium 9.3  8.4 - 10.5 mg/dL   Total Protein 7.0  6.0 - 8.3 g/dL   Albumin 3.5  3.5 - 5.2 g/dL   AST 35  0 - 37 U/L   ALT 40 (*) 0 - 35 U/L   Alkaline Phosphatase 77  39 - 117 U/L   Total Bilirubin 0.7  0.3 - 1.2 mg/dL   GFR calc non Af Amer 44 (*) >90 mL/min   GFR calc Af Amer 50 (*) >90 mL/min   Comment: (NOTE)     The eGFR has been calculated using the CKD EPI equation.     This calculation has not been validated in all clinical situations.      eGFR's persistently <90 mL/min signify possible Chronic Kidney     Disease.  PROTIME-INR     Status: None   Collection Time    10/01/13  9:18 PM  Result Value Ref Range   Prothrombin Time 12.5  11.6 - 15.2 seconds   INR 0.95  0.00 - 1.49  ETHANOL     Status: None   Collection Time    10/01/13  9:18 PM      Result Value Ref Range   Alcohol, Ethyl (B) <11  0 - 11 mg/dL   Comment:            LOWEST DETECTABLE LIMIT FOR     SERUM ALCOHOL IS 11 mg/dL     FOR MEDICAL PURPOSES ONLY  AMMONIA     Status: None   Collection Time    10/01/13  9:33 PM      Result Value Ref Range   Ammonia 26  11 - 60 umol/L  I-STAT VENOUS BLOOD GAS, ED     Status: Abnormal   Collection Time    10/01/13  9:47 PM      Result Value Ref Range   pH, Ven 7.456 (*) 7.250 - 7.300   pCO2, Ven 30.7 (*) 45.0 - 50.0 mmHg   pO2, Ven 39.0  30.0 - 45.0 mmHg   Bicarbonate 21.6  20.0 - 24.0 mEq/L   TCO2 23  0 - 100 mmol/L   O2 Saturation 77.0     Acid-base deficit 1.0  0.0 - 2.0 mmol/L   Patient temperature 98.7 F     Sample type VENOUS     Comment NOTIFIED PHYSICIAN    URINALYSIS, ROUTINE W REFLEX MICROSCOPIC     Status: Abnormal   Collection Time    10/02/13  4:23 AM      Result Value Ref Range   Color, Urine YELLOW  YELLOW   APPearance CLOUDY (*) CLEAR   Specific Gravity, Urine 1.016  1.005 - 1.030   pH 5.5  5.0 - 8.0   Glucose, UA NEGATIVE  NEGATIVE mg/dL   Hgb urine dipstick NEGATIVE  NEGATIVE   Bilirubin Urine NEGATIVE  NEGATIVE   Ketones, ur NEGATIVE  NEGATIVE mg/dL   Protein, ur NEGATIVE  NEGATIVE mg/dL   Urobilinogen, UA 0.2  0.0 - 1.0 mg/dL   Nitrite NEGATIVE  NEGATIVE   Leukocytes, UA MODERATE (*) NEGATIVE  URINE RAPID DRUG SCREEN (HOSP PERFORMED)     Status: None   Collection Time    10/02/13  4:23 AM      Result Value Ref Range   Opiates NONE DETECTED  NONE DETECTED   Cocaine NONE DETECTED  NONE DETECTED   Benzodiazepines NONE DETECTED  NONE DETECTED   Amphetamines NONE DETECTED   NONE DETECTED   Tetrahydrocannabinol NONE DETECTED  NONE DETECTED   Barbiturates NONE DETECTED  NONE DETECTED   Comment:            DRUG SCREEN FOR MEDICAL PURPOSES     ONLY.  IF CONFIRMATION IS NEEDED     FOR ANY PURPOSE, NOTIFY LAB     WITHIN 5 DAYS.                LOWEST DETECTABLE LIMITS     FOR URINE DRUG SCREEN     Drug Class       Cutoff (ng/mL)     Amphetamine      1000     Barbiturate      200     Benzodiazepine   026     Tricyclics       378     Opiates          300  Cocaine          300     THC              50  URINE MICROSCOPIC-ADD ON     Status: Abnormal   Collection Time    10/02/13  4:23 AM      Result Value Ref Range   Squamous Epithelial / LPF FEW (*) RARE   WBC, UA 11-20  <3 WBC/hpf   RBC / HPF 0-2  <3 RBC/hpf   Bacteria, UA FEW (*) RARE   Urine-Other MANY YEAST    GLUCOSE, CAPILLARY     Status: Abnormal   Collection Time    10/02/13  7:00 AM      Result Value Ref Range   Glucose-Capillary 101 (*) 70 - 99 mg/dL   Comment 1 Documented in Chart     Comment 2 Notify RN    HEMOGLOBIN A1C     Status: Abnormal   Collection Time    10/02/13  7:27 AM      Result Value Ref Range   Hemoglobin A1C 6.2 (*) <5.7 %   Comment: (NOTE)                                                                               According to the ADA Clinical Practice Recommendations for 2011, when     HbA1c is used as a screening test:      >=6.5%   Diagnostic of Diabetes Mellitus               (if abnormal result is confirmed)     5.7-6.4%   Increased risk of developing Diabetes Mellitus     References:Diagnosis and Classification of Diabetes Mellitus,Diabetes     TAVW,9794,80(XKPVV 1):S62-S69 and Standards of Medical Care in             Diabetes - 2011,Diabetes Care,2011,34 (Suppl 1):S11-S61.   Mean Plasma Glucose 131 (*) <117 mg/dL   Comment: Performed at [facility]: Abnormal   Collection Time    10/02/13  7:27 AM      Result Value Ref  Range   Cholesterol 155  0 - 200 mg/dL   Triglycerides 181 (*) <150 mg/dL   HDL 33 (*) >39 mg/dL   Total CHOL/HDL Ratio 4.7     VLDL 36  0 - 40 mg/dL   LDL Cholesterol 86  0 - 99 mg/dL   Comment:            Total Cholesterol/HDL:CHD Risk     Coronary Heart Disease Risk Table                         Men   Women      1/2 Average Risk   3.4   3.3      Average Risk       5.0   4.4      2 X Average Risk   9.6   7.1      3 X Average Risk  23.4   11.0  Use the calculated Patient Ratio     above and the CHD Risk Table     to determine the patient's CHD Risk.                ATP III CLASSIFICATION (LDL):      <100     mg/dL   Optimal      100-129  mg/dL   Near or Above                        Optimal      130-159  mg/dL   Borderline      160-189  mg/dL   High      >190     mg/dL   Very High  GLUCOSE, CAPILLARY     Status: Abnormal   Collection Time    10/03/13  9:18 PM      Result Value Ref Range   Glucose-Capillary 123 (*) 70 - 99 mg/dL   Comment 1 Notify RN     Comment 2 Documented in Chart    URINE CULTURE     Status: None   Collection Time    10/04/13  7:07 AM      Result Value Ref Range   Specimen Description URINE, RANDOM     Special Requests NONE     Culture  Setup Time       Value: 10/04/2013 07:29     Performed at [facility]       Value: NO GROWTH     Performed at [facility]   Culture       Value: NO GROWTH     Performed at [facility]   Report Status 10/05/2013 FINAL    URINALYSIS, ROUTINE W REFLEX MICROSCOPIC     Status: Abnormal   Collection Time    10/26/13  6:21 PM      Result Value Ref Range   Color, Urine YELLOW  YELLOW   APPearance CLEAR  CLEAR   Specific Gravity, Urine 1.020  1.005 - 1.030   pH 5.5  5.0 - 8.0   Glucose, UA NEG  NEG mg/dL   Bilirubin Urine NEG  NEG   Ketones, ur NEG  NEG mg/dL   Hgb urine dipstick TRACE (*) NEG   Protein, ur NEG  NEG mg/dL   Urobilinogen, UA 0.2  0.0 - 1.0  mg/dL   Nitrite NEG  NEG   Leukocytes, UA SMALL (*) NEG  URINALYSIS, MICROSCOPIC ONLY     Status: Abnormal   Collection Time    10/26/13  6:21 PM      Result Value Ref Range   Squamous Epithelial / LPF NONE SEEN  RARE   Crystals Y  NONE SEEN   Casts NONE SEEN  NONE SEEN   WBC, UA 7-10 (*) <3 WBC/hpf   RBC / HPF 0-2  <3 RBC/hpf   Bacteria, UA FEW (*) RARE   Urine-Other SEE NOTE     Comment: Many yeast  POCT URINALYSIS DIPSTICK     Status: None   Collection Time    10/26/13  6:25 PM      Result Value Ref Range   Color, UA straw     Clarity, UA cloudy     Glucose, UA neg     Bilirubin, UA neg     Ketones, UA neg     Spec Grav, UA 1.025     Blood, UA  small, hemolyzed     pH, UA 5.0     Protein, UA neg     Urobilinogen, UA 0.2     Nitrite, UA neg     Leukocytes, UA Negative    BASIC METABOLIC PANEL     Status: None   Collection Time    11/14/13 10:57 AM      Result Value Ref Range   Sodium 142  135 - 145 mEq/L   Potassium 4.5  3.5 - 5.3 mEq/L   Chloride 107  96 - 112 mEq/L   CO2 28  19 - 32 mEq/L   Glucose, Bld 99  70 - 99 mg/dL   BUN 11  6 - 23 mg/dL   Creat 0.80  0.50 - 1.10 mg/dL   Calcium 9.0  8.4 - 10.5 mg/dL  URINALYSIS W MICROSCOPIC + REFLEX CULTURE     Status: Abnormal   Collection Time    11/14/13 10:57 AM      Result Value Ref Range   Color, Urine YELLOW  YELLOW   APPearance CLEAR  CLEAR   Specific Gravity, Urine 1.015  1.005 - 1.030   pH 5.5  5.0 - 8.0   Glucose, UA NEG  NEG mg/dL   Bilirubin Urine NEG  NEG   Ketones, ur NEG  NEG mg/dL   Hgb urine dipstick NEG  NEG   Protein, ur NEG  NEG mg/dL   Urobilinogen, UA 0.2  0.0 - 1.0 mg/dL   Nitrite NEG  NEG   Leukocytes, UA SMALL (*) NEG   Squamous Epithelial / LPF NONE SEEN  RARE   Crystals NONE SEEN  NONE SEEN   Casts NONE SEEN  NONE SEEN   WBC, UA 11-20 (*) <3 WBC/hpf   RBC / HPF 0-2  <3 RBC/hpf   Bacteria, UA NONE SEEN  RARE  TSH     Status: None   Collection Time    11/14/13 10:57 AM       Result Value Ref Range   TSH 4.036  0.350 - 4.500 uIU/mL  URINE CULTURE     Status: None   Collection Time    11/14/13 10:57 AM      Result Value Ref Range   Colony Count >=100,000 COLONIES/ML     Organism ID, Bacteria CITROBACTER FREUNDII    CBC     Status: Abnormal   Collection Time    11/18/13  4:57 PM      Result Value Ref Range   WBC 8.2  4.0 - 10.5 K/uL   RBC 4.04  3.87 - 5.11 MIL/uL   Hemoglobin 11.8 (*) 12.0 - 15.0 g/dL   HCT 34.7 (*) 36.0 - 46.0 %   MCV 85.9  78.0 - 100.0 fL   MCH 29.2  26.0 - 34.0 pg   MCHC 34.0  30.0 - 36.0 g/dL   RDW 13.8  11.5 - 15.5 %   Platelets 239  150 - 400 K/uL  COMPREHENSIVE METABOLIC PANEL     Status: None   Collection Time    11/18/13  4:57 PM      Result Value Ref Range   Sodium 141  135 - 145 mEq/L   Potassium 4.1  3.5 - 5.3 mEq/L   Chloride 104  96 - 112 mEq/L   CO2 27  19 - 32 mEq/L   Glucose, Bld 92  70 - 99 mg/dL   BUN 12  6 - 23 mg/dL   Creat 0.88  0.50 -  1.10 mg/dL   Total Bilirubin 0.5  0.2 - 1.2 mg/dL   Alkaline Phosphatase 72  39 - 117 U/L   AST 16  0 - 37 U/L   ALT 15  0 - 35 U/L   Total Protein 6.7  6.0 - 8.3 g/dL   Albumin 3.8  3.5 - 5.2 g/dL   Calcium 9.3  8.4 - 10.5 mg/dL  SEDIMENTATION RATE     Status: None   Collection Time    11/18/13  4:57 PM      Result Value Ref Range   Sed Rate 7  0 - 22 mm/hr    Assessment/Plan: Syncope and collapse Question syncope vs falling asleep.  Unclear giving patient's mild dementia.  EKG obtained revealing NSR with RBBB, unchanged from previous EKG.  Recent Echo, Carotid Dopplers, MRI and MRA of the Head are within normal limits. Will obtain CT Head without contrast.  Will also recheck blood work. Increase fluid intake.  Avoid heat or overexertion.  Family is to monitor patient.  If symptoms recur, patient is to be taken immediately to the ER.  Hypertension Will switch from Lisinopril to Losartan due to SE.  Patient to return in 2 weeks for BP recheck.  Dementia Will contact  [facility] regarding sending RN, Aide and Social Work to patient's dwelling.  Also recommended PT to help with core strengthening to prevent falls. Discussed with family the need to move patient to a more skilled facility.  UTI (urinary tract infection) Rx Cipro.  Increase fluids.  Cranberry supplement.  Muscle cramps Will switch from ACEI to ARB. Will repeat US of left lower extremity as this is source of most pain.

## 2013-11-24 ENCOUNTER — Other Ambulatory Visit: Payer: Self-pay | Admitting: Physician Assistant

## 2013-11-24 DIAGNOSIS — Z742 Need for assistance at home and no other household member able to render care: Secondary | ICD-10-CM

## 2013-11-29 ENCOUNTER — Telehealth: Payer: Self-pay | Admitting: *Deleted

## 2013-11-29 NOTE — Telephone Encounter (Signed)
Received message from Henderson NewcomerMelissa Stenson w/[facility] with a few questions re: recent home health referrals.  1.  States they are currently full for nursing services and it will be "some time" before she can be accepted for nursing services. States they can go forward with PT, social work and home health aide at present.  Left message for Melissa to return my call to verify timeframe that pt may be able to be accepted to their nursing services.  2.  She also states that pt was referred to them at her hospital discharge on 10/05/13 and pt refused their services on 10/06/13. Wants to know if pt is aware and agreeable to new referrals?

## 2013-11-30 DIAGNOSIS — R55 Syncope and collapse: Secondary | ICD-10-CM | POA: Insufficient documentation

## 2013-11-30 NOTE — Telephone Encounter (Signed)
Melissa Stenson ([facility]) called back stating she does not have an expected time that pt will be enrolled in nursing services but she is awaiting call back from the nursing supervisor. She states they will proceed with other services requested below.

## 2013-11-30 NOTE — Assessment & Plan Note (Signed)
Will contact [facility] regarding sending RN, Aide and Social Work to patient's dwelling.  Also recommended PT to help with core strengthening to prevent falls. Discussed with family the need to move patient to a more skilled facility.

## 2013-11-30 NOTE — Assessment & Plan Note (Signed)
Question syncope vs falling asleep.  Unclear giving patient's mild dementia.  EKG obtained revealing NSR with RBBB, unchanged from previous EKG.  Recent Echo, Carotid Dopplers, MRI and MRA of the Head are within normal limits. Will obtain CT Head without contrast.  Will also recheck blood work. Increase fluid intake.  Avoid heat or overexertion.  Family is to monitor patient.  If symptoms recur, patient is to be taken immediately to the ER.

## 2013-11-30 NOTE — Assessment & Plan Note (Signed)
Will switch from Lisinopril to Losartan due to SE.  Patient to return in 2 weeks for BP recheck.

## 2013-11-30 NOTE — Assessment & Plan Note (Signed)
Will switch from ACEI to ARB. Will repeat US of left lower extremity as this is source of most pain.

## 2013-11-30 NOTE — Telephone Encounter (Signed)
Patient has some dementia.  I have talked it over with the patient and family and they were all in agreement concerning home health. Please let me know when you find out the time frame.

## 2013-11-30 NOTE — Assessment & Plan Note (Signed)
Rx Cipro.  Increase fluids.  Cranberry supplement.

## 2013-12-09 ENCOUNTER — Telehealth: Payer: Self-pay

## 2013-12-09 NOTE — Telephone Encounter (Signed)
[name] w/ [facility] would like a verbal order for work on balance, safe transfers and cane usage for 2 times a week for 2 weeks and 1 time a week for 1 week.  Please advise?   Verbal per Malva Coganody Martin ok to give verbal order..  Left a detailed message for Deborah HuaDavid ok

## 2014-01-12 ENCOUNTER — Telehealth: Payer: Self-pay

## 2014-01-12 NOTE — Telephone Encounter (Signed)
Victorino DikeJennifer ([facility]). Stated that "she sent over a plan of care for Dr. Abner GreenspanBlyth to sign, date, and fax back. They haven't received it and wanted to check on it. She will fax again just in case it wasn't received.

## 2014-01-12 NOTE — Telephone Encounter (Signed)
Its on md's desk

## 2014-01-18 ENCOUNTER — Telehealth: Payer: Self-pay

## 2014-01-18 NOTE — Telephone Encounter (Signed)
We received paperwork from [facility] for a new hospital bed with a gel overlay.  Per md: Unfortunately pt will need to be seen to address this paperwork. Pt was seen in June by PA-Cody but this was not addressed  The contact name on paperwork is Cherlynn JuneMark Gay (? Or Jillyn HiddenGary) [phone]. Paperwork states the paper needs filled out along with copy of her chart notes to support this.  Patient needs appt. I left a message on Marks phone to return our call.  Paperwork on my desk

## 2014-01-25 ENCOUNTER — Telehealth: Payer: Self-pay

## 2014-01-25 NOTE — Telephone Encounter (Signed)
Galen Daft stated that " she sent over a fax to Dr. Abner Greenspan a criteria for hospital bed and mattress on 8/15. They still haven't received it and they need it to support her nee for services. "LDM

## 2014-01-25 NOTE — Telephone Encounter (Signed)
Correct, I have the form awaiting her visit tomorrow. I have not seen her so long I was afraid it would not be valid for DME documentation

## 2014-01-26 ENCOUNTER — Encounter: Payer: Self-pay | Admitting: Family Medicine

## 2014-01-26 ENCOUNTER — Ambulatory Visit (INDEPENDENT_AMBULATORY_CARE_PROVIDER_SITE_OTHER): Payer: Commercial Managed Care - HMO | Admitting: Family Medicine

## 2014-01-26 VITALS — BP 124/70 | HR 63 | Temp 98.1°F | Ht 65.0 in | Wt 184.1 lb

## 2014-01-26 DIAGNOSIS — I10 Essential (primary) hypertension: Secondary | ICD-10-CM

## 2014-01-26 DIAGNOSIS — D62 Acute posthemorrhagic anemia: Secondary | ICD-10-CM

## 2014-01-26 DIAGNOSIS — E039 Hypothyroidism, unspecified: Secondary | ICD-10-CM | POA: Diagnosis not present

## 2014-01-26 DIAGNOSIS — M1990 Unspecified osteoarthritis, unspecified site: Secondary | ICD-10-CM

## 2014-01-26 DIAGNOSIS — Z23 Encounter for immunization: Secondary | ICD-10-CM

## 2014-01-26 DIAGNOSIS — N390 Urinary tract infection, site not specified: Secondary | ICD-10-CM

## 2014-01-26 DIAGNOSIS — F0390 Unspecified dementia without behavioral disturbance: Secondary | ICD-10-CM

## 2014-01-26 DIAGNOSIS — M129 Arthropathy, unspecified: Secondary | ICD-10-CM

## 2014-01-26 DIAGNOSIS — R300 Dysuria: Secondary | ICD-10-CM | POA: Diagnosis not present

## 2014-01-26 LAB — URINALYSIS

## 2014-01-26 LAB — RENAL FUNCTION PANEL
ALBUMIN: 4 g/dL (ref 3.5–5.2)
BUN: 11 mg/dL (ref 6–23)
CO2: 27 mEq/L (ref 19–32)
CREATININE: 0.9 mg/dL (ref 0.50–1.10)
Calcium: 9 mg/dL (ref 8.4–10.5)
Chloride: 105 mEq/L (ref 96–112)
GLUCOSE: 94 mg/dL (ref 70–99)
Phosphorus: 3.5 mg/dL (ref 2.3–4.6)
Potassium: 4.5 mEq/L (ref 3.5–5.3)
Sodium: 141 mEq/L (ref 135–145)

## 2014-01-26 LAB — HEPATIC FUNCTION PANEL
ALBUMIN: 4 g/dL (ref 3.5–5.2)
ALT: 14 U/L (ref 0–35)
AST: 14 U/L (ref 0–37)
Alkaline Phosphatase: 75 U/L (ref 39–117)
Bilirubin, Direct: 0.1 mg/dL (ref 0.0–0.3)
Indirect Bilirubin: 0.6 mg/dL (ref 0.2–1.2)
TOTAL PROTEIN: 6.7 g/dL (ref 6.0–8.3)
Total Bilirubin: 0.7 mg/dL (ref 0.2–1.2)

## 2014-01-26 LAB — CBC
HCT: 36.2 % (ref 36.0–46.0)
Hemoglobin: 12.3 g/dL (ref 12.0–15.0)
MCH: 29.1 pg (ref 26.0–34.0)
MCHC: 34 g/dL (ref 30.0–36.0)
MCV: 85.6 fL (ref 78.0–100.0)
Platelets: 271 10*3/uL (ref 150–400)
RBC: 4.23 MIL/uL (ref 3.87–5.11)
RDW: 13.4 % (ref 11.5–15.5)
WBC: 9.1 10*3/uL (ref 4.0–10.5)

## 2014-01-26 LAB — TSH: TSH: 3.224 u[IU]/mL (ref 0.350–4.500)

## 2014-01-26 NOTE — Patient Instructions (Signed)

## 2014-01-26 NOTE — Telephone Encounter (Signed)
Form done at visit today

## 2014-01-28 ENCOUNTER — Encounter: Payer: Self-pay | Admitting: Family Medicine

## 2014-01-28 NOTE — Progress Notes (Signed)
Patient ID: [name], female   DOB: March 21, 1921, 80 y.o.   MRN: [id number]   [name] [id number] [birth year] 01/28/2014      Progress Note-Follow Up  Subjective  Chief Complaint  Chief Complaint  Patient presents with  . Follow-up    discuss hospital bed  . Injections    flu    HPI  Patient is a 80 year old female in today for routine medical care. In today with family, in need of a new hospital bed. She has been using one for years and is in need of a replacement. She is unable to sleep without it. She is unable to get comfortable lying down, Is unable to get up from a lying position and she cannot breath well when she lies flat. She needs to elevate her head for this reason and she needs to elevate her feet due to swelling. Denies CP/palp/SOB/HA/congestion/fevers/GI or GU c/o. Taking meds as prescribed  Past Medical History  Diagnosis Date  . Arthritis   . Chicken pox   . Chronic kidney disease   . Colon polyps   . Hypertension   . Dementia   . Angina   . Dysrhythmia   . Heart murmur   . Shortness of breath   . Recurrent upper respiratory infection (URI)   . Pneumonia   . Hypothyroidism   . Anemia   . Headache(784.0)   . Anxiety   . Depression   . Valvular heart disease 09/04/2012  . Left leg pain 10/02/2012  . UTI (urinary tract infection) 12/30/2012  . Acute bronchitis 04/10/2013  . Dehydration 08/31/2013  . Arthritis 10/02/2012    Past Surgical History  Procedure Laterality Date  . Appendectomy    . Cholecystectomy    . Kidney stones    . Tonsillectomy    . Abdominal hysterectomy    . Hand surgery    . Feet surgery    . Shoulder arthroscopy    . Back surgery    . Tonsillectomy    . Eye surgery      cataract removed and eye lids lifted  . Fracture surgery      bilateral arms  . Tubal ligation    . Colonoscopy w/ polypectomy      Family History  Problem Relation Age of Onset  . Heart disease Mother   . Heart disease Father   . Heart disease Other    . Birth defects Other     History   Social History  . Marital Status: Widowed    Spouse Name: N/A    Number of Children: N/A  . Years of Education: N/A   Occupational History  . Not on file.   Social History Main Topics  . Smoking status: Former Smoker -- 0.20 packs/day for 1 years    Types: Cigarettes    Quit date: 06/02/1941  . Smokeless tobacco: Never Used  . Alcohol Use: No  . Drug Use: No  . Sexual Activity: Not Currently    Birth Control/ Protection: Abstinence   Other Topics Concern  . Not on file   Social History Narrative   Still lives at home and ambulatory daily. Doesn't use cane or walker, but has cane at home. Remote smoking history.     Current Outpatient Prescriptions on File Prior to Visit  Medication Sig Dispense Refill  . acetaminophen (TYLENOL) 325 MG tablet Take 650 mg by mouth every 6 (six) hours as needed for moderate pain.      Marland Kitchen  albuterol (PROVENTIL HFA;VENTOLIN HFA) 108 (90 BASE) MCG/ACT inhaler Inhale 2 puffs into the lungs every 6 (six) hours as needed for wheezing or shortness of breath.  1 Inhaler  0  . levothyroxine (SYNTHROID, LEVOTHROID) 50 MCG tablet Take 1 tablet (50 mcg total) by mouth daily.  90 tablet  1  . LORazepam (ATIVAN) 1 MG tablet Take 0.5 mg by mouth every 8 (eight) hours as needed for anxiety.      Marland Kitchen losartan (COZAAR) 50 MG tablet Take 0.5 tablets (25 mg total) by mouth daily.  30 tablet  2  . Magnesium 250 MG TABS Take 250 mg by mouth daily.       Marland Kitchen guaiFENesin-dextromethorphan (ROBITUSSIN DM) 100-10 MG/5ML syrup Take 5 mLs by mouth every 4 (four) hours as needed for cough.  118 mL  0   No current facility-administered medications on file prior to visit.    Allergies  Allergen Reactions  . Codeine Rash  . Hydrocodone Rash    Review of Systems  Review of Systems  Constitutional: Positive for malaise/fatigue. Negative for fever.  HENT: Negative for congestion.   Eyes: Negative for discharge.  Respiratory: Negative  for shortness of breath.   Cardiovascular: Negative for chest pain, palpitations and leg swelling.  Gastrointestinal: Negative for nausea, abdominal pain and diarrhea.  Genitourinary: Negative for dysuria.  Musculoskeletal: Positive for back pain, joint pain and myalgias. Negative for falls.  Skin: Negative for rash.  Neurological: Negative for loss of consciousness and headaches.  Endo/Heme/Allergies: Negative for polydipsia.  Psychiatric/Behavioral: Positive for memory loss. Negative for depression and suicidal ideas. The patient is nervous/anxious. The patient does not have insomnia.     Objective  BP 124/70  Pulse 63  Temp(Src) 98.1 F (36.7 C) (Oral)  Ht  (1.651 m)  Wt 184 lb 1.9 oz (83.516 kg)  BMI 30.64 kg/m2  SpO2 96%  Physical Exam  Physical Exam  Constitutional: She is oriented to person, place, and time and well-developed, well-nourished, and in no distress. No distress.  HENT:  Head: Normocephalic and atraumatic.  Eyes: Conjunctivae are normal.  Neck: Neck supple. No thyromegaly present.  Cardiovascular: Normal rate, regular rhythm and normal heart sounds.   No murmur heard. Pulmonary/Chest: Effort normal and breath sounds normal. She has no wheezes.  Abdominal: She exhibits no distension and no mass.  Musculoskeletal: She exhibits no edema.  Lymphadenopathy:    She has no cervical adenopathy.  Neurological: She is alert and oriented to person, place, and time.  Skin: Skin is warm and dry. No rash noted. She is not diaphoretic.  Psychiatric: Memory, affect and judgment normal.    Lab Results  Component Value Date   TSH 3.224 01/26/2014   Lab Results  Component Value Date   WBC 9.1 01/26/2014   HGB 12.3 01/26/2014   HCT 36.2 01/26/2014   MCV 85.6 01/26/2014   PLT 271 01/26/2014   Lab Results  Component Value Date   CREATININE 0.90 01/26/2014   BUN 11 01/26/2014   NA 141 01/26/2014   K 4.5 01/26/2014   CL 105 01/26/2014   CO2 27 01/26/2014   Lab  Results  Component Value Date   ALT 14 01/26/2014   AST 14 01/26/2014   ALKPHOS 75 01/26/2014   BILITOT 0.7 01/26/2014   Lab Results  Component Value Date   CHOL 155 10/02/2013   Lab Results  Component Value Date   HDL 33* 10/02/2013   Lab Results  Component Value Date  LDLCALC 86 10/02/2013   Lab Results  Component Value Date   TRIG 181* 10/02/2013   Lab Results  Component Value Date   CHOLHDL 4.7 10/02/2013     Assessment & Plan  Hypertension Well controlled, no changes to meds. Encouraged heart healthy diet such as the DASH diet and exercise as tolerated.   Arthritis Chronic pain, low back pain with radicular pain/weakness down b/l legs. Has been using a hosptial bed for years and is in need of a new one. He cannot sleep in a regular bed due to pain, weakness and SOB when lyin gflat. Is given a new prescription for a bed today.  Dementia Discussed issues with early memory loss, so far not interested in medications.   Urinary tract infection, site not specified Patient unable to supply urine sample today. Will return if symptoms develop

## 2014-01-28 NOTE — Assessment & Plan Note (Signed)
Chronic pain, low back pain with radicular pain/weakness down b/l legs. Has been using a hosptial bed for years and is in need of a new one. He cannot sleep in a regular bed due to pain, weakness and SOB when lyin gflat. Is given a new prescription for a bed today.

## 2014-01-28 NOTE — Assessment & Plan Note (Signed)
Well controlled, no changes to meds. Encouraged heart healthy diet such as the DASH diet and exercise as tolerated.  °

## 2014-01-28 NOTE — Assessment & Plan Note (Signed)
Patient unable to supply urine sample today. Will return if symptoms develop

## 2014-01-28 NOTE — Assessment & Plan Note (Signed)
Discussed issues with early memory loss, so far not interested in medications.

## 2014-02-02 NOTE — Telephone Encounter (Signed)
This was done at last OV

## 2014-02-10 IMAGING — NM NM BONE 3 PHASE
2 series · 12 of 12 positions shown · non-contrast
Comparison: None.

CLINICAL DATA: Left knee pain and left hip pain

NUCLEAR MEDICINE THREE PHASE BONE SCAN
TECHNIQUE: Radionuclide angiographic images, immediate static
blood pool images, and 3-hour delayed static images were obtained
after intravenous injection of radiopharmaceutical.
Radiopharmaceutical: FYTDHHD KAKI JIM TECHNETIUM TC 99M
MEDRONATE IV KIT

[Series 1: fl flow and static · 4.75mm/px · 6 of 40 frames shown (1 of 2)]
[frame 4/40  full-range]
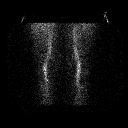
[frame 10/40  full-range]
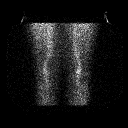
[frame 17/40  full-range]
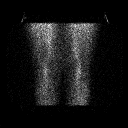
[frame 24/40  full-range]
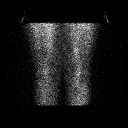
[frame 30/40  full-range]
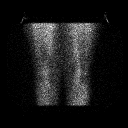
[frame 37/40  full-range]
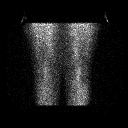

[Series 1: fl flow and static · 4.75mm/px · 6 of 40 frames shown (2 of 2)]
[frame 4/40  full-range]
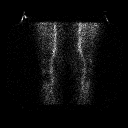
[frame 10/40  full-range]
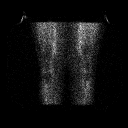
[frame 17/40  full-range]
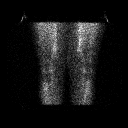
[frame 24/40  full-range]
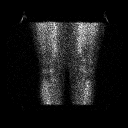
[frame 30/40  full-range]
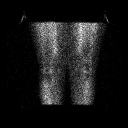
[frame 37/40  full-range]
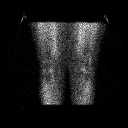

[12 of 12 positions shown; findings below may reference images not displayed]

FINDINGS: Vascular phase: No  asymmetric or increased vascular flow in the
left or right knee.

Blood pool phase:  No asymmetric increased blood pool activity
within the left or right knee.

Delayed phase:  No asymmetric increased delayed phase active within
the left or right hip or left or right knee. Mild degenerative
uptake within the left and right knee.
IMPRESSION: No evidence of fracture within the left hip or left knee.

Mild degenerative uptake within the knees.

## 2014-03-13 ENCOUNTER — Ambulatory Visit: Payer: Commercial Managed Care - HMO | Admitting: Internal Medicine

## 2014-03-14 ENCOUNTER — Ambulatory Visit (INDEPENDENT_AMBULATORY_CARE_PROVIDER_SITE_OTHER): Payer: Commercial Managed Care - HMO | Admitting: Internal Medicine

## 2014-03-14 ENCOUNTER — Other Ambulatory Visit: Payer: Commercial Managed Care - HMO

## 2014-03-14 ENCOUNTER — Encounter: Payer: Self-pay | Admitting: Internal Medicine

## 2014-03-14 VITALS — BP 124/70 | HR 50 | Temp 98.1°F | Wt 187.0 lb

## 2014-03-14 DIAGNOSIS — R8290 Unspecified abnormal findings in urine: Secondary | ICD-10-CM

## 2014-03-14 DIAGNOSIS — B369 Superficial mycosis, unspecified: Secondary | ICD-10-CM

## 2014-03-14 DIAGNOSIS — M549 Dorsalgia, unspecified: Secondary | ICD-10-CM

## 2014-03-14 DIAGNOSIS — R300 Dysuria: Secondary | ICD-10-CM

## 2014-03-14 LAB — POCT URINALYSIS DIPSTICK
Glucose, UA: NEGATIVE
KETONES UA: NEGATIVE
Nitrite, UA: POSITIVE
Spec Grav, UA: 1.02
Urobilinogen, UA: 0.2
pH, UA: 5

## 2014-03-14 MED ORDER — KETOCONAZOLE 2 % EX CREA: 1.0000 "application " | TOPICAL_CREAM | Freq: Every day | CUTANEOUS | Status: DC

## 2014-03-14 MED ORDER — PHENAZOPYRIDINE HCL 200 MG PO TABS: 200.0000 mg | ORAL_TABLET | Freq: Three times a day (TID) | ORAL | Status: DC | PRN

## 2014-03-14 MED ORDER — NITROFURANTOIN MONOHYD MACRO 100 MG PO CAPS: 100.0000 mg | ORAL_CAPSULE | Freq: Two times a day (BID) | ORAL | Status: DC

## 2014-03-14 NOTE — Patient Instructions (Signed)
Drink as much nondairy fluids as possible. Avoid spicy foods or alcohol as  these may aggravate the bladder. Do not take decongestants. Avoid narcotics if possible.   The urine to help the burning will turn urine orange.

## 2014-03-14 NOTE — Progress Notes (Signed)
   Subjective:    Patient ID: [name], female    DOB: 06/30/1920, 80 y.o.   MRN: [id number]  HPI   Symptoms began 03/13/14 as dysuria and frequency. She also experienced sensation of incomplete voiding. She's experienced some left flank pain described as dull. Tylenol is of some benefit  She does have a history of renal calculi which were resected surgically. She also has a history of recurrent urinary tract infections  In reviewing the chart there 3 urine cultures available. The one in June of this year was a true urinary tract infection 100,000 Citrobacter organisms. In May there was no growth. In October 2014 100,000 colonies of multiple species documented   Review of Systems   She denies fever, chills, sweats  She has no hematuria or pyuria.     Objective:   Physical Exam   Positive or pertinent findings include: She has an upper dental plate. She has some dental fravtures of the maxillary teeth. An S4 is noted with increased second heart sound She is slightly tender over suprapubic area to palpation. Abdomen is protuberant There is a 2 x 1 cm scaley, irregular scaly rash at the left lateral calf.  General appearance :adequately nourished; in no distress. Eyes: No conjunctival inflammation or scleral icterus is present. Oral exam: Dental hygiene is good. Lips and gums are healthy appearing.There is no oropharyngeal erythema or exudate noted.  Heart:  Normal rate and regular rhythm. S1 normal without gallop, murmur, click, or rub . Lungs:Chest clear to auscultation; no wheezes, rhonchi,rales ,or rubs present.No increased work of breathing.  Abdomen: bowel sounds normal, soft  without masses, organomegaly or hernias noted.  No guarding or rebound. Vascular : all pulses equal ; no bruits present. Skin:Warm & dry.  Intact without suspicious lesions or rashes ; no jaundice . No tenting Lymphatic: No lymphadenopathy is noted about the head, neck, axilla               Assessment & Plan:  #1 dysuria and frequency with abnormal urinalysis  #2 possible fungal dermatitis  Plan: See orders and recommendations

## 2014-03-14 NOTE — Progress Notes (Signed)
Pre visit review using our clinic review tool, if applicable. No additional management support is needed unless otherwise documented below in the visit note. 

## 2014-03-16 LAB — URINE CULTURE: Colony Count: >=100000

## 2014-03-17 ENCOUNTER — Other Ambulatory Visit: Payer: Self-pay

## 2014-03-17 DIAGNOSIS — R8290 Unspecified abnormal findings in urine: Secondary | ICD-10-CM

## 2014-03-17 DIAGNOSIS — R300 Dysuria: Secondary | ICD-10-CM

## 2014-03-17 MED ORDER — NITROFURANTOIN MONOHYD MACRO 100 MG PO CAPS: 100.0000 mg | ORAL_CAPSULE | Freq: Two times a day (BID) | ORAL | Status: DC

## 2014-04-06 ENCOUNTER — Ambulatory Visit (INDEPENDENT_AMBULATORY_CARE_PROVIDER_SITE_OTHER): Payer: Commercial Managed Care - HMO | Admitting: Family Medicine

## 2014-04-06 ENCOUNTER — Encounter: Payer: Self-pay | Admitting: Family Medicine

## 2014-04-06 VITALS — BP 140/68 | HR 63 | Temp 98.2°F | Ht 65.0 in | Wt 183.8 lb

## 2014-04-06 DIAGNOSIS — B369 Superficial mycosis, unspecified: Secondary | ICD-10-CM

## 2014-04-06 DIAGNOSIS — A499 Bacterial infection, unspecified: Secondary | ICD-10-CM

## 2014-04-06 DIAGNOSIS — I10 Essential (primary) hypertension: Secondary | ICD-10-CM

## 2014-04-06 DIAGNOSIS — F0390 Unspecified dementia without behavioral disturbance: Secondary | ICD-10-CM

## 2014-04-06 DIAGNOSIS — E785 Hyperlipidemia, unspecified: Secondary | ICD-10-CM

## 2014-04-06 DIAGNOSIS — N289 Disorder of kidney and ureter, unspecified: Secondary | ICD-10-CM

## 2014-04-06 DIAGNOSIS — E039 Hypothyroidism, unspecified: Secondary | ICD-10-CM

## 2014-04-06 DIAGNOSIS — N390 Urinary tract infection, site not specified: Secondary | ICD-10-CM

## 2014-04-06 LAB — HEPATIC FUNCTION PANEL
ALBUMIN: 3.4 g/dL — AB (ref 3.5–5.2)
ALT: 19 U/L (ref 0–35)
AST: 19 U/L (ref 0–37)
Alkaline Phosphatase: 74 U/L (ref 39–117)
Bilirubin, Direct: 0.1 mg/dL (ref 0.0–0.3)
TOTAL PROTEIN: 7 g/dL (ref 6.0–8.3)
Total Bilirubin: 0.8 mg/dL (ref 0.2–1.2)

## 2014-04-06 LAB — URINALYSIS, ROUTINE W REFLEX MICROSCOPIC
Bilirubin Urine: NEGATIVE
KETONES UR: NEGATIVE
Nitrite: POSITIVE — AB
Specific Gravity, Urine: 1.02 (ref 1.000–1.030)
Total Protein, Urine: NEGATIVE
URINE GLUCOSE: NEGATIVE
Urobilinogen, UA: 0.2 (ref 0.0–1.0)
pH: 5.5 (ref 5.0–8.0)

## 2014-04-06 LAB — RENAL FUNCTION PANEL
ALBUMIN: 3.4 g/dL — AB (ref 3.5–5.2)
BUN: 18 mg/dL (ref 6–23)
CO2: 25 meq/L (ref 19–32)
CREATININE: 1 mg/dL (ref 0.4–1.2)
Calcium: 9.2 mg/dL (ref 8.4–10.5)
Chloride: 107 mEq/L (ref 96–112)
GFR: 55.17 mL/min — ABNORMAL LOW (ref >60.00)
Glucose, Bld: 93 mg/dL (ref 70–99)
Phosphorus: 3.2 mg/dL (ref 2.3–4.6)
Potassium: 4.7 mEq/L (ref 3.5–5.1)
Sodium: 141 mEq/L (ref 135–145)

## 2014-04-06 LAB — TSH: TSH: 1.99 u[IU]/mL (ref 0.35–4.50)

## 2014-04-06 LAB — CBC
HEMATOCRIT: 38.2 % (ref 36.0–46.0)
HEMOGLOBIN: 12.6 g/dL (ref 12.0–15.0)
MCHC: 33.1 g/dL (ref 30.0–36.0)
MCV: 90.5 fl (ref 78.0–100.0)
PLATELETS: 273 10*3/uL (ref 150.0–400.0)
RBC: 4.22 Mil/uL (ref 3.87–5.11)
RDW: 13.1 % (ref 11.5–15.5)
WBC: 9.6 10*3/uL (ref 4.0–10.5)

## 2014-04-06 MED ORDER — CEFDINIR 300 MG PO CAPS: 300.0000 mg | ORAL_CAPSULE | Freq: Two times a day (BID) | ORAL | Status: DC

## 2014-04-06 MED ORDER — KETOCONAZOLE 2 % EX CREA: 1.0000 "application " | TOPICAL_CREAM | Freq: Every day | CUTANEOUS | Status: DC

## 2014-04-06 NOTE — Patient Instructions (Signed)
[facility] please set up for new patient with Dr Modena JanskyKohler in next couple of months   Body Ringworm Ringworm (tinea corporis) is a fungal infection of the skin on the body. This infection is not caused by worms, but is actually caused by a fungus. Fungus normally lives on the top of your skin and can be useful. However, in the case of ringworms, the fungus grows out of control and causes a skin infection. It can involve any area of skin on the body and can spread easily from one person to another (contagious). Ringworm is a common problem for children, but it can affect adults as well. Ringworm is also often found in athletes, especially wrestlers who share equipment and mats.  CAUSES  Ringworm of the body is caused by a fungus called dermatophyte. It can spread by:  Touchingother people who are infected.  Touchinginfected pets.  Touching or sharingobjects that have been in contact with the infected person or pet (hats, combs, towels, clothing, sports equipment). SYMPTOMS   Itchy, raised red spots and bumps on the skin.  Ring-shaped rash.  Redness near the border of the rash with a clear center.  Dry and scaly skin on or around the rash. Not every person develops a ring-shaped rash. Some develop only the red, scaly patches. DIAGNOSIS  Most often, ringworm can be diagnosed by performing a skin exam. Your caregiver may choose to take a skin scraping from the affected area. The sample will be examined under the microscope to see if the fungus is present.  TREATMENT  Body ringworm may be treated with a topical antifungal cream or ointment. Sometimes, an antifungal shampoo that can be used on your body is prescribed. You may be prescribed antifungal medicines to take by mouth if your ringworm is severe, keeps coming back, or lasts a long time.  HOME CARE INSTRUCTIONS   Only take over-the-counter or prescription medicines as directed by your  caregiver.  Wash the infected area and dry it completely before applying yourcream or ointment.  When using antifungal shampoo to treat the ringworm, leave the shampoo on the body for 3-5 minutes before rinsing.   Wear loose clothing to stop clothes from rubbing and irritating the rash.  Wash or change your bed sheets every night while you have the rash.  Have your pet treated by your veterinarian if it has the same infection. To prevent ringworm:   Practice good hygiene.  Wear sandals or shoes in public places and showers.  Do not share personal items with others.  Avoid touching red patches of skin on other people.  Avoid touching pets that have bald spots or wash your hands after doing so. SEEK MEDICAL CARE IF:   Your rash continues to spread after 7 days of treatment.  Your rash is not gone in 4 weeks.  The area around your rash becomes red, warm, tender, and swollen. Document Released: 05/16/2000 Document Revised: 02/11/2012 Document Reviewed: 12/01/2011 [facility] Patient Information 2015 [employer] [place]. This information is not intended to replace advice given to you by your health care provider. Make sure you discuss any questions you have with your health care provider.

## 2014-04-09 ENCOUNTER — Encounter: Payer: Self-pay | Admitting: Family Medicine

## 2014-04-09 LAB — URINE CULTURE: Colony Count: 70000

## 2014-04-09 NOTE — Assessment & Plan Note (Signed)
On Levothyroxine, continue to monitor. [facility] [place] [facility] to [facility]. Will arrange for follow up there for her

## 2014-04-09 NOTE — Assessment & Plan Note (Signed)
Given rx for Cefdinir and encouraged adequate hydration

## 2014-04-09 NOTE — Assessment & Plan Note (Signed)
Improved on recheck. No changes today, minimize sodium and caffeine.

## 2014-04-09 NOTE — Assessment & Plan Note (Signed)
Is accompanied by family who reports she has recently been getting lost when driving, she is very angry when told not to drive. She is advised not to drive.

## 2014-04-09 NOTE — Progress Notes (Signed)
Patient ID: [name], female   DOB: [birth year], 80 y.o.   MRN: [id number] [name] [id number] 04-Feb-1921 04/09/2014      Progress Note-Follow Up  Subjective  Chief Complaint  Chief Complaint  Patient presents with  . Follow-up    10 week    HPI  Patient is a 80 year old female in today for routine medical care. She is here today with family and they are concerned about her memory getting worse, she has gotten lost while driving several times now. She denies it and becomes agitated when pressed. Otherwise there are no acute complaints. Denies CP/palp/SOB/HA/congestion/fevers/GI or GU c/o. Taking meds as prescribed  Past Medical History  Diagnosis Date  . Arthritis   . Chicken pox   . Chronic kidney disease   . Colon polyps   . Hypertension   . Dementia   . Angina   . Dysrhythmia   . Heart murmur   . Shortness of breath   . Recurrent upper respiratory infection (URI)   . Pneumonia   . Hypothyroidism   . Anemia   . Headache(784.0)   . Anxiety   . Depression   . Valvular heart disease 09/04/2012  . Left leg pain 10/02/2012  . UTI (urinary tract infection) 12/30/2012  . Acute bronchitis 04/10/2013  . Dehydration 08/31/2013  . Arthritis 10/02/2012    Past Surgical History  Procedure Laterality Date  . Appendectomy    . Cholecystectomy    . Kidney stones    . Tonsillectomy    . Abdominal hysterectomy    . Hand surgery    . Feet surgery    . Shoulder arthroscopy    . Back surgery    . Tonsillectomy    . Eye surgery      cataract removed and eye lids lifted  . Fracture surgery      bilateral arms  . Tubal ligation    . Colonoscopy w/ polypectomy      Family History  Problem Relation Age of Onset  . Heart disease Mother   . Heart disease Father   . Heart disease Other   . Birth defects Other     History   Social History  . Marital Status: Widowed    Spouse Name: N/A    Number of Children: N/A  . Years of Education: N/A   Occupational History  .  Not on file.   Social History Main Topics  . Smoking status: Former Smoker -- 0.20 packs/day for 1 years    Types: Cigarettes    Quit date: 06/02/1941  . Smokeless tobacco: Never Used  . Alcohol Use: No  . Drug Use: No  . Sexual Activity: Not Currently    Birth Control/ Protection: Abstinence   Other Topics Concern  . Not on file   Social History Narrative   Still lives at home and ambulatory daily. Doesn't use cane or walker, but has cane at home. Remote smoking history.     Current Outpatient Prescriptions on File Prior to Visit  Medication Sig Dispense Refill  . acetaminophen (TYLENOL) 325 MG tablet Take 650 mg by mouth every 6 (six) hours as needed for moderate pain.    Marland Kitchen albuterol (PROVENTIL HFA;VENTOLIN HFA) 108 (90 BASE) MCG/ACT inhaler Inhale 2 puffs into the lungs every 6 (six) hours as needed for wheezing or shortness of breath. 1 Inhaler 0  . aspirin 81 MG tablet Take 81 mg by mouth daily.    Marland Kitchen guaiFENesin-dextromethorphan (ROBITUSSIN  DM) 100-10 MG/5ML syrup Take 5 mLs by mouth every 4 (four) hours as needed for cough. 118 mL 0  . levothyroxine (SYNTHROID, LEVOTHROID) 50 MCG tablet Take 1 tablet (50 mcg total) by mouth daily. 90 tablet 1  . LORazepam (ATIVAN) 1 MG tablet Take 0.5 mg by mouth every 8 (eight) hours as needed for anxiety.    Marland Kitchen. losartan (COZAAR) 50 MG tablet Take 0.5 tablets (25 mg total) by mouth daily. 30 tablet 2  . Magnesium 250 MG TABS Take 250 mg by mouth daily.      No current facility-administered medications on file prior to visit.    Allergies  Allergen Reactions  . Codeine Rash  . Hydrocodone Rash    Review of Systems  Review of Systems  Constitutional: Negative for fever and malaise/fatigue.  HENT: Negative for congestion.   Eyes: Negative for discharge.  Respiratory: Negative for shortness of breath.   Cardiovascular: Negative for chest pain, palpitations and leg swelling.  Gastrointestinal: Negative for nausea, abdominal pain and  diarrhea.  Genitourinary: Negative for dysuria.  Musculoskeletal: Negative for falls.  Skin: Negative for rash.  Neurological: Negative for loss of consciousness and headaches.  Endo/Heme/Allergies: Negative for polydipsia.  Psychiatric/Behavioral: Positive for memory loss. Negative for depression and suicidal ideas. The patient is nervous/anxious. The patient does not have insomnia.     Objective  BP 140/68 mmHg  Pulse 63  Temp(Src) 98.2 F (36.8 C) (Oral)  Ht 5\' 5"  (1.651 m)  Wt 183 lb 12.8 oz (83.371 kg)  BMI 30.59 kg/m2  SpO2 99%  Physical Exam  Physical Exam  Constitutional: She is oriented to person, place, and time and well-developed, well-nourished, and in no distress. No distress.  HENT:  Head: Normocephalic and atraumatic.  Eyes: Conjunctivae are normal.  Neck: Neck supple. No thyromegaly present.  Cardiovascular: Normal rate, regular rhythm and normal heart sounds.   Pulmonary/Chest: Effort normal and breath sounds normal. She has no wheezes.  Abdominal: She exhibits no distension and no mass.  Musculoskeletal: She exhibits no edema.  Lymphadenopathy:    She has no cervical adenopathy.  Neurological: She is alert and oriented to person, place, and time.  Skin: Skin is warm and dry. No rash noted. She is not diaphoretic.  Psychiatric: Memory, affect and judgment normal.    Lab Results  Component Value Date   TSH 1.99 04/06/2014   Lab Results  Component Value Date   WBC 9.6 04/06/2014   HGB 12.6 04/06/2014   HCT 38.2 04/06/2014   MCV 90.5 04/06/2014   PLT 273.0 04/06/2014   Lab Results  Component Value Date   CREATININE 1.0 04/06/2014   BUN 18 04/06/2014   NA 141 04/06/2014   K 4.7 04/06/2014   CL 107 04/06/2014   CO2 25 04/06/2014   Lab Results  Component Value Date   ALT 19 04/06/2014   AST 19 04/06/2014   ALKPHOS 74 04/06/2014   BILITOT 0.8 04/06/2014   Lab Results  Component Value Date   CHOL 155 10/02/2013   Lab Results  Component  Value Date   HDL 33* 10/02/2013   Lab Results  Component Value Date   LDLCALC 86 10/02/2013   Lab Results  Component Value Date   TRIG 181* 10/02/2013   Lab Results  Component Value Date   CHOLHDL 4.7 10/02/2013     Assessment & Plan   Hypertension Improved on recheck. No changes today, minimize sodium and caffeine.  Dementia Is accompanied by family who  reports she has recently been getting lost when driving, she is very angry when told not to drive. She is advised not to drive.  Hypothyroidism On Levothyroxine, continue to monitor. [facility]. Will arrange for follow up there for her  UTI (urinary tract infection), bacterial Given rx for Cefdinir and encouraged adequate hydration

## 2014-04-12 ENCOUNTER — Other Ambulatory Visit: Payer: Self-pay | Admitting: Family Medicine

## 2014-05-08 ENCOUNTER — Ambulatory Visit (INDEPENDENT_AMBULATORY_CARE_PROVIDER_SITE_OTHER): Payer: Medicare HMO | Admitting: Family

## 2014-05-08 ENCOUNTER — Encounter: Payer: Self-pay | Admitting: Family

## 2014-05-08 VITALS — BP 186/82 | HR 53 | Temp 98.3°F | Resp 18 | Ht 65.0 in | Wt 185.4 lb

## 2014-05-08 DIAGNOSIS — R21 Rash and other nonspecific skin eruption: Secondary | ICD-10-CM | POA: Insufficient documentation

## 2014-05-08 MED ORDER — CEPHALEXIN 500 MG PO CAPS: 500.0000 mg | ORAL_CAPSULE | Freq: Four times a day (QID) | ORAL | Status: DC

## 2014-05-08 NOTE — Assessment & Plan Note (Signed)
Exam consistent with potential fungal dermatitis. Continue treatment with ketoconazole 2% cream. Start Keflex secondary to potential dermatitis infection. If noted to continue, will refer to dermatology.

## 2014-05-08 NOTE — Progress Notes (Signed)
Pre visit review using our clinic review tool, if applicable. No additional management support is needed unless otherwise documented below in the visit note. 

## 2014-05-08 NOTE — Progress Notes (Signed)
   Subjective:    Patient ID: [name], female    DOB: 05-05-1921, 80 y.o.   MRN: [id number]  Chief Complaint  Patient presents with  . Sore    has sore on left leg x3 months on and off     HPI:  [name] is a 80 y.o. female who presents today for an acute visit.   Acute symptoms started about 3 months ago and has been previously been seen for fungal dermatitis. She has been using the ketoconazole 2% cream which she indicates that it helps, but sometimes it does not. States that she has had multiple breakouts of the same thing. Currently on the lateral side of lower leg and new area on medial side of lower leg. Denies any itchiness or fevers.  Allergies  Allergen Reactions  . Codeine Rash  . Hydrocodone Rash   Current Outpatient Prescriptions on File Prior to Visit  Medication Sig Dispense Refill  . acetaminophen (TYLENOL) 325 MG tablet Take 650 mg by mouth every 6 (six) hours as needed for moderate pain.    Marland Kitchen. albuterol (PROVENTIL HFA;VENTOLIN HFA) 108 (90 BASE) MCG/ACT inhaler Inhale 2 puffs into the lungs every 6 (six) hours as needed for wheezing or shortness of breath. 1 Inhaler 0  . aspirin 81 MG tablet Take 81 mg by mouth daily.    Marland Kitchen. guaiFENesin-dextromethorphan (ROBITUSSIN DM) 100-10 MG/5ML syrup Take 5 mLs by mouth every 4 (four) hours as needed for cough. 118 mL 0  . ketoconazole (NIZORAL) 2 % cream Apply 1 application topically daily. 60 g 2  . levothyroxine (SYNTHROID, LEVOTHROID) 50 MCG tablet TAKE 1 TABLET (50 MCG TOTAL) BY MOUTH DAILY. 30 tablet 3  . LORazepam (ATIVAN) 1 MG tablet Take 0.5 mg by mouth every 8 (eight) hours as needed for anxiety.    Marland Kitchen. losartan (COZAAR) 50 MG tablet Take 0.5 tablets (25 mg total) by mouth daily. 30 tablet 2  . Magnesium 250 MG TABS Take 250 mg by mouth daily.     . cefdinir (OMNICEF) 300 MG capsule Take 1 capsule (300 mg total) by mouth 2 (two) times daily. (Patient not taking: Reported on 05/08/2014) 10 capsule 0   No current  facility-administered medications on file prior to visit.   Review of Systems    See HPI  Objective:    BP 186/82 mmHg  Pulse 53  Temp(Src) 98.3 F (36.8 C) (Oral)  Resp 18  Ht 5\' 5"  (1.651 m)  Wt 185 lb 6.4 oz (84.097 kg)  BMI 30.85 kg/m2  SpO2 95% Nursing note and vital signs reviewed.  Physical Exam  Constitutional: She is oriented to person, place, and time. She appears well-developed and well-nourished. No distress.  Cardiovascular: Normal rate, regular rhythm, normal heart sounds and intact distal pulses.   Pulmonary/Chest: Effort normal and breath sounds normal.  Neurological: She is alert and oriented to person, place, and time.  Skin: Skin is warm and dry.  Oblong half dollar size lesion present lateral left lower extremity. Mild redness and tenderness. Scaling noted over center of lesion. No discharge present.  Small brown colored lesion noted medial aspect of left tibia. Outside this lesion appear slightly darker than inside. No discoloration, warmth or tenderness noted.  Psychiatric: She has a normal mood and affect. Her behavior is normal. Judgment and thought content normal.       Assessment & Plan:

## 2014-05-08 NOTE — Patient Instructions (Addendum)
Thank you for choosing [facility].  Summary/Instructions:  Your prescription(s) have been submitted to your pharmacy. Please take as directed and contact our office if you believe you are having problem(s) with the medication(s).  If your symptoms worsen or fail to improve, please contact our office for further instruction, or in case of emergency go directly to the emergency room at the closest medical facility.    Body Ringworm Ringworm (tinea corporis) is a fungal infection of the skin on the body. This infection is not caused by worms, but is actually caused by a fungus. Fungus normally lives on the top of your skin and can be useful. However, in the case of ringworms, the fungus grows out of control and causes a skin infection. It can involve any area of skin on the body and can spread easily from one person to another (contagious). Ringworm is a common problem for children, but it can affect adults as well. Ringworm is also often found in athletes, especially wrestlers who share equipment and mats.  CAUSES  Ringworm of the body is caused by a fungus called dermatophyte. It can spread by:  Touchingother people who are infected.  Touchinginfected pets.  Touching or sharingobjects that have been in contact with the infected person or pet (hats, combs, towels, clothing, sports equipment). SYMPTOMS   Itchy, raised red spots and bumps on the skin.  Ring-shaped rash.  Redness near the border of the rash with a clear center.  Dry and scaly skin on or around the rash. Not every person develops a ring-shaped rash. Some develop only the red, scaly patches. DIAGNOSIS  Most often, ringworm can be diagnosed by performing a skin exam. Your caregiver may choose to take a skin scraping from the affected area. The sample will be examined under the microscope to see if the fungus is present.  TREATMENT  Body ringworm may be treated with a topical antifungal cream or ointment. Sometimes,  an antifungal shampoo that can be used on your body is prescribed. You may be prescribed antifungal medicines to take by mouth if your ringworm is severe, keeps coming back, or lasts a long time.  HOME CARE INSTRUCTIONS   Only take over-the-counter or prescription medicines as directed by your caregiver.  Wash the infected area and dry it completely before applying yourcream or ointment.  When using antifungal shampoo to treat the ringworm, leave the shampoo on the body for 3-5 minutes before rinsing.   Wear loose clothing to stop clothes from rubbing and irritating the rash.  Wash or change your bed sheets every night while you have the rash.  Have your pet treated by your veterinarian if it has the same infection. To prevent ringworm:   Practice good hygiene.  Wear sandals or shoes in public places and showers.  Do not share personal items with others.  Avoid touching red patches of skin on other people.  Avoid touching pets that have bald spots or wash your hands after doing so. SEEK MEDICAL CARE IF:   Your rash continues to spread after 7 days of treatment.  Your rash is not gone in 4 weeks.  The area around your rash becomes red, warm, tender, and swollen. Document Released: 05/16/2000 Document Revised: 02/11/2012 Document Reviewed: 12/01/2011 Exit[employer] Patient Information 2015 [employer]. This information is not intended to replace advice given to you by your health care provider. Make sure you discuss any questions you have with your health care provider.  

## 2014-05-14 IMAGING — US US EXTREM LOW VENOUS*L*
1 series · 14 of 19 positions shown · non-contrast
Comparison: June 29, 2012

CLINICAL DATA: Left calf region pain

EXAM:
VENOUS DUPLEX ULTRASOUND OF LEFT LOWER EXTREMITY
TECHNIQUE: Gray-scale sonography with graded compression, as well as color
Doppler and duplex ultrasound, were performed to evaluate the deep
venous system from the level of the common femoral vein through the
popliteal and proximal calf veins. Spectral Doppler was utilized to
evaluate flow at rest and with distal augmentation maneuvers.

[Series 1: us extrem low venous*left* · 14 of 19 slices shown]
[im 1/19]
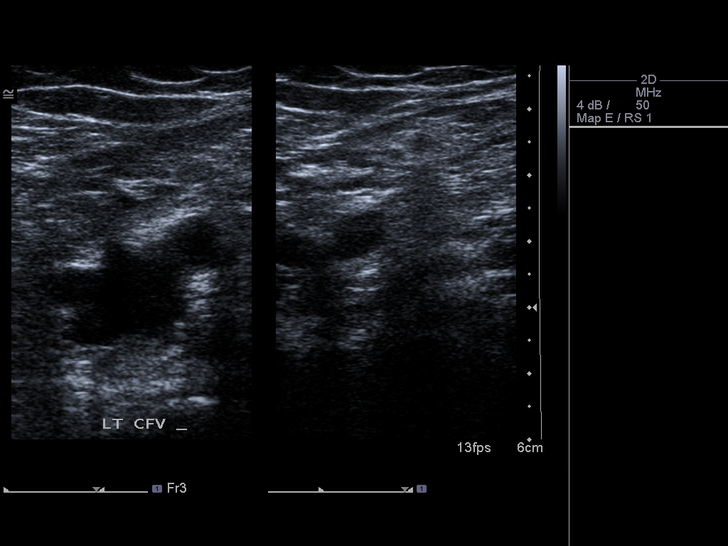
[im 3/19]
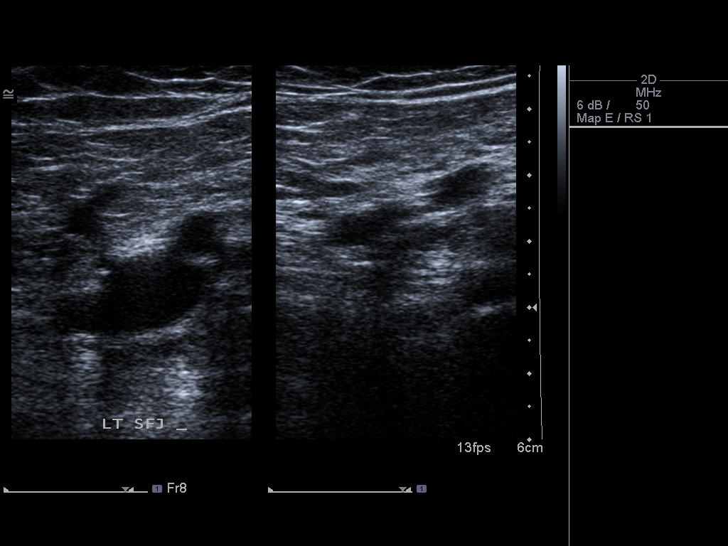
[im 4/19]
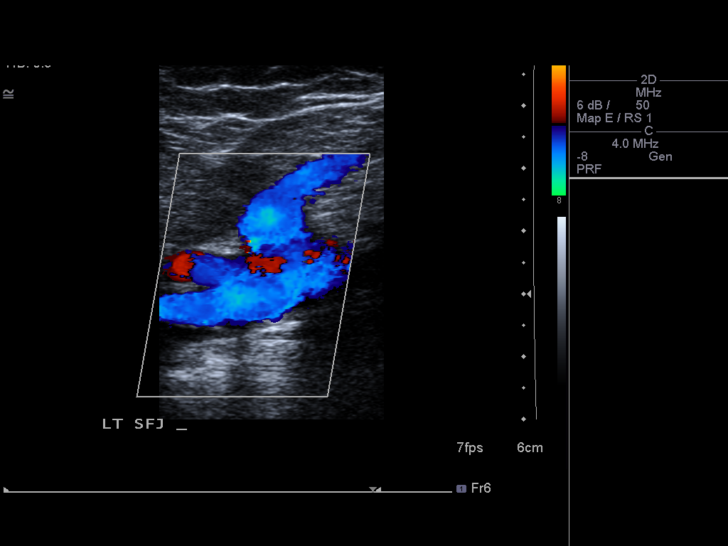
[im 5/19]
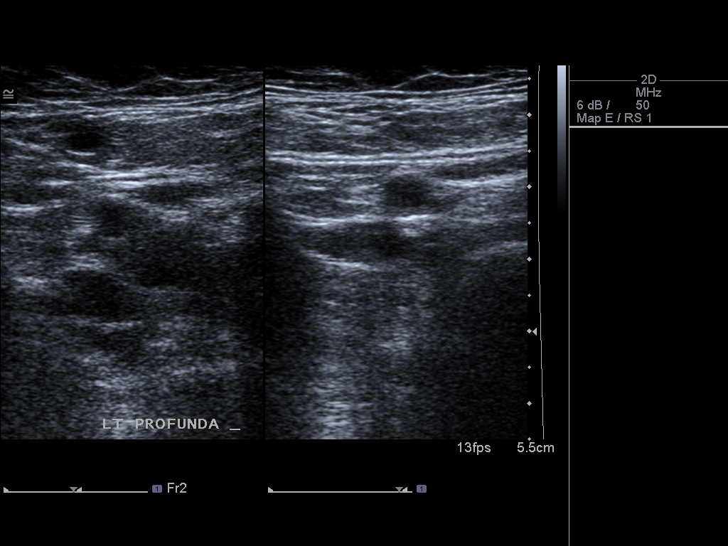
[im 7/19]
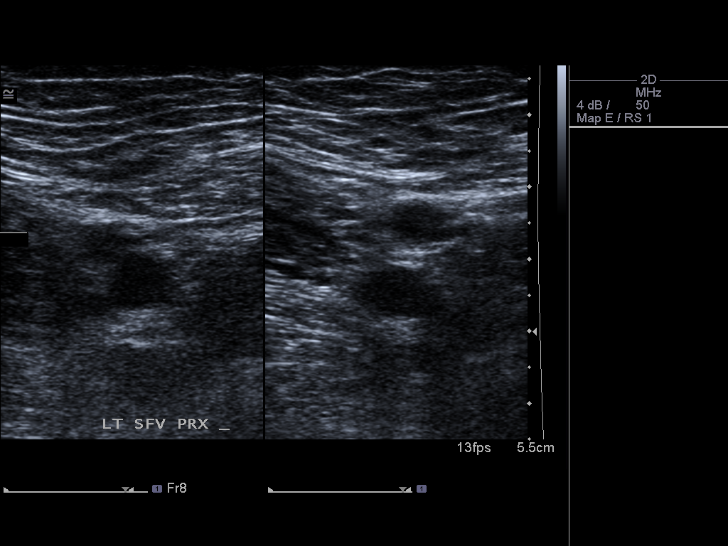
[im 8/19]
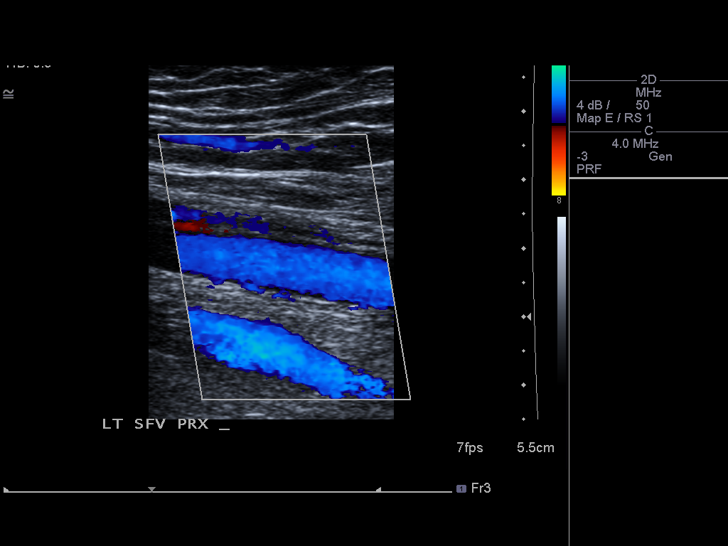
[im 9/19]
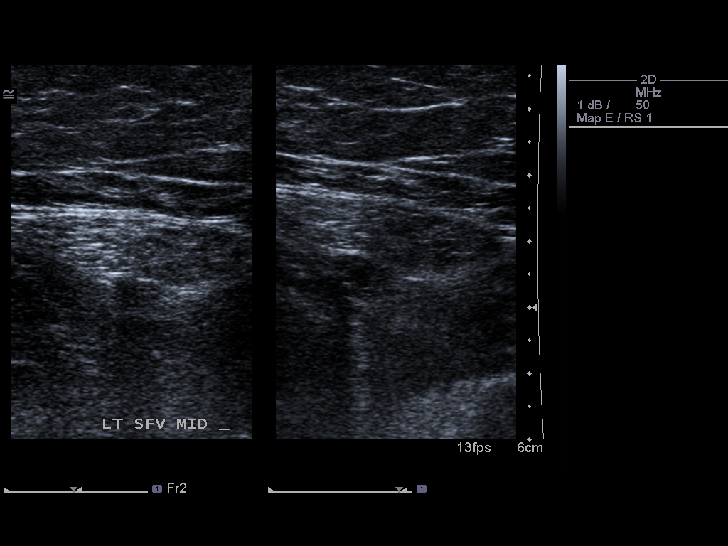
[im 11/19]
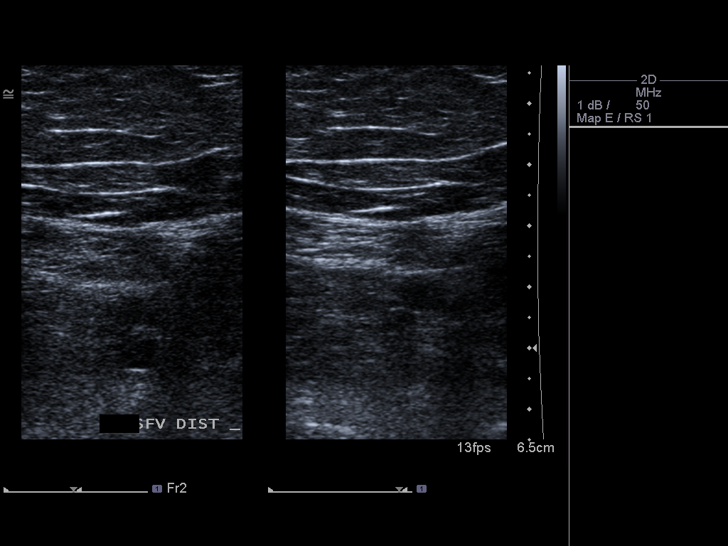
[im 12/19]
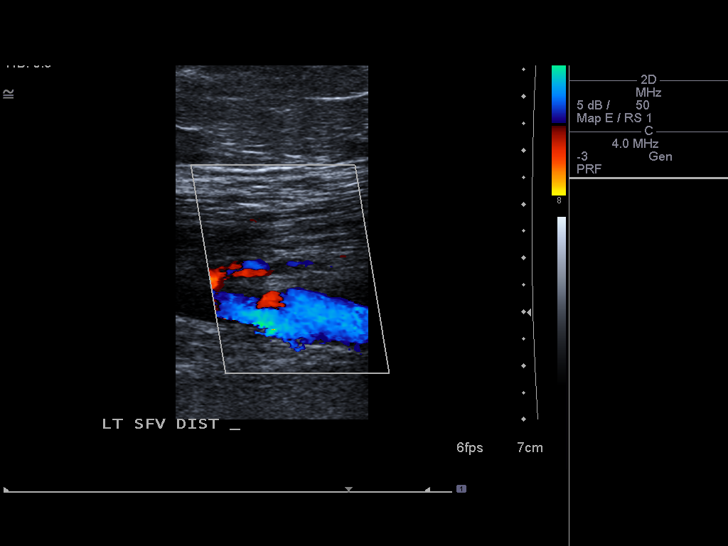
[im 13/19]
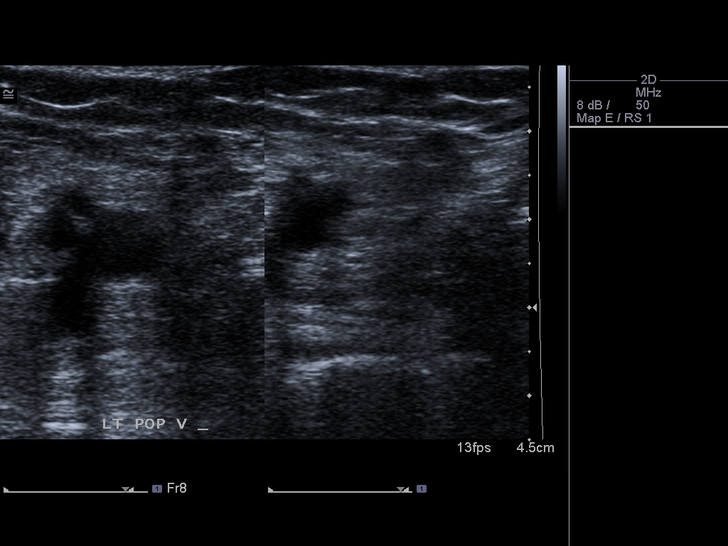
[im 15/19]
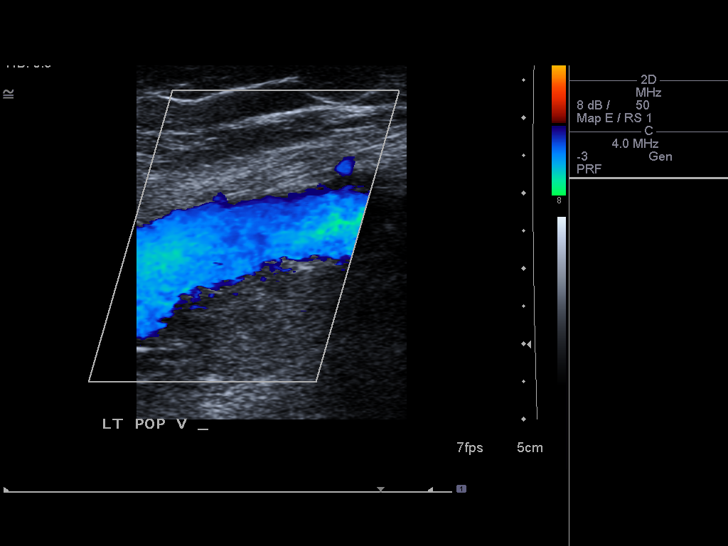
[im 16/19]
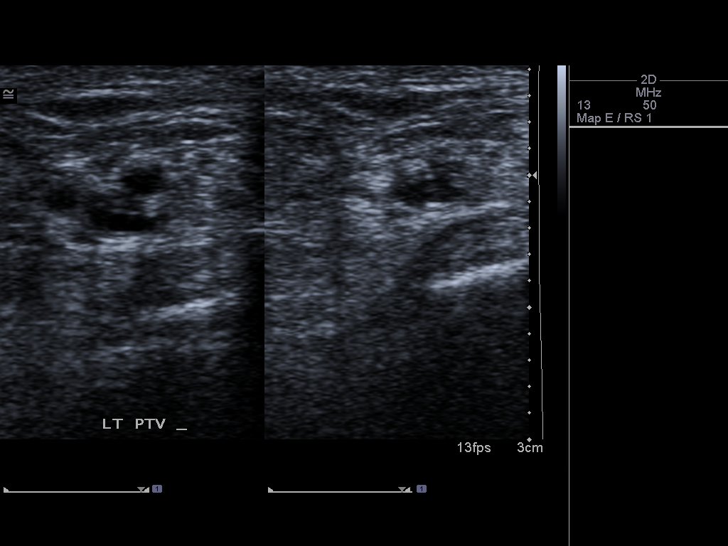
[im 17/19]
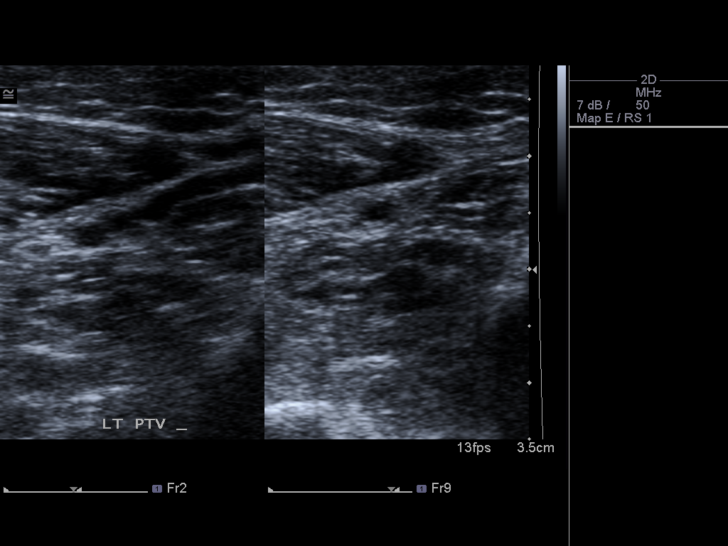
[im 19/19]
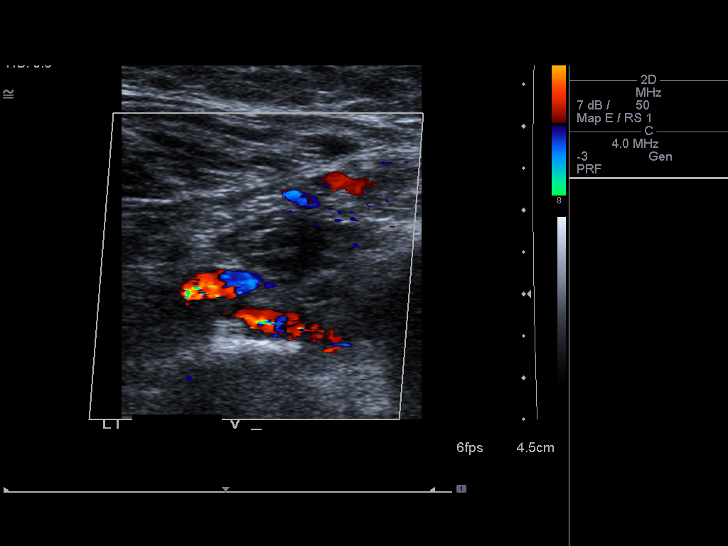

[14 of 19 positions shown; findings below may reference images not displayed]

FINDINGS: Flow in the venous structures of the left lower extremity is
spontaneous and phasic in all segments. There is normal compression
and augmentation in the venous structures of the left lower
extremity. Venous Doppler signal is normal in all regions. There is
no thrombus in the deep or visualized superficial venous structures
on the left. There is no left-sided deep venous incompetence.
IMPRESSION: No evidence of left lower extremity deep venous thrombosis.

## 2014-05-15 ENCOUNTER — Telehealth: Payer: Self-pay | Admitting: *Deleted

## 2014-05-15 NOTE — Telephone Encounter (Signed)
Received paperwork for [employer] for driving ability. Forms forwarded to Dr. Abner GreenspanBlyth. JG//CMA

## 2014-06-08 ENCOUNTER — Encounter: Payer: Self-pay | Admitting: Internal Medicine

## 2014-06-08 ENCOUNTER — Ambulatory Visit (INDEPENDENT_AMBULATORY_CARE_PROVIDER_SITE_OTHER): Payer: Commercial Managed Care - HMO | Admitting: Internal Medicine

## 2014-06-08 VITALS — BP 148/80 | HR 59 | Temp 97.5°F | Resp 14 | Ht 65.0 in | Wt 182.6 lb

## 2014-06-08 DIAGNOSIS — F0390 Unspecified dementia without behavioral disturbance: Secondary | ICD-10-CM

## 2014-06-08 MED ORDER — DONEPEZIL HCL 5 MG PO TABS: 5.0000 mg | ORAL_TABLET | Freq: Every day | ORAL | Status: DC

## 2014-06-08 NOTE — Patient Instructions (Signed)
We would like you to try a medicine called aricept. It is a medicine that helps to slow the decline of memory.   We do not think that you should be driving at all.   We would like to see you back in about 3 months to see how the medicine is doing with you. If you have any problems or questions before then please call us.   A good book to read about memory problems and dementia is called the 36 hour day by Denver FasterNancy Mace and Theron AristaPeter Rabins. This is a good book for family members and others who help to take care of you.  Memory Compensation Strategies  1. Use "WARM" strategy.  W= write it down  A= associate it  R= repeat it  M= make a mental note  2.   You can keep a Glass blower/designerMemory Notebook.  Use a 3-ring notebook with sections for the following: calendar, important names and phone numbers,  medications, doctors' names/phone numbers, lists/reminders, and a section to journal what you did  each day.   3.    Use a calendar to write appointments down.  4.    Write yourself a schedule for the day.  This can be placed on the calendar or in a separate section of the Memory Notebook.  Keeping a  regular schedule can help memory.  5.    Use medication organizer with sections for each day or morning/evening pills.  You may need help loading it  6.    Keep a basket, or pegboard by the door.  Place items that you need to take out with you in the basket or on the pegboard.  You may also want to  include a message board for reminders.  7.    Use sticky notes.  Place sticky notes with reminders in a place where the task is performed.  For example: " turn off the  stove" placed by the stove, "lock the door" placed on the door at eye level, " take your medications" on  the bathroom mirror or by the place where you normally take your medications.  8.    Use alarms/timers.  Use while cooking to remind yourself to check on food or as a reminder to take your medicine, or as a  reminder to make a call, or as a reminder  to perform another task, etc.

## 2014-06-08 NOTE — Progress Notes (Signed)
Pre visit review using our clinic review tool, if applicable. No additional management support is needed unless otherwise documented below in the visit note. 
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
## 2014-06-09 NOTE — Telephone Encounter (Signed)
[facility] JG//CMA
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
## 2014-06-13 ENCOUNTER — Other Ambulatory Visit: Payer: Self-pay | Admitting: Physician Assistant

## 2014-06-13 ENCOUNTER — Telehealth: Payer: Self-pay | Admitting: Internal Medicine
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
## 2014-06-13 NOTE — Telephone Encounter (Signed)
Rx request to pharmacy/[facility]  

## 2014-06-14 NOTE — Telephone Encounter (Signed)
I spoke with patient and cleared the issue up. She has dementia and it is not stated in her medical history that she has a mental problem.

## 2014-06-15 ENCOUNTER — Emergency Department (HOSPITAL_COMMUNITY): Payer: Commercial Managed Care - HMO

## 2014-06-15 ENCOUNTER — Emergency Department (HOSPITAL_COMMUNITY)
Admission: EM | Admit: 2014-06-15 | Discharge: 2014-06-16 | Disposition: A | Discharge status: Home / Self Care | Payer: Commercial Managed Care - HMO | Attending: Emergency Medicine | Admitting: Emergency Medicine

## 2014-06-15 ENCOUNTER — Encounter (HOSPITAL_COMMUNITY): Payer: Self-pay | Admitting: Emergency Medicine

## 2014-06-15 DIAGNOSIS — Z8701 Personal history of pneumonia (recurrent): Secondary | ICD-10-CM | POA: Insufficient documentation

## 2014-06-15 DIAGNOSIS — Z79899 Other long term (current) drug therapy: Secondary | ICD-10-CM | POA: Diagnosis not present

## 2014-06-15 DIAGNOSIS — R011 Cardiac murmur, unspecified: Secondary | ICD-10-CM | POA: Insufficient documentation

## 2014-06-15 DIAGNOSIS — N189 Chronic kidney disease, unspecified: Secondary | ICD-10-CM | POA: Insufficient documentation

## 2014-06-15 DIAGNOSIS — Z7982 Long term (current) use of aspirin: Secondary | ICD-10-CM | POA: Insufficient documentation

## 2014-06-15 DIAGNOSIS — I499 Cardiac arrhythmia, unspecified: Secondary | ICD-10-CM | POA: Insufficient documentation

## 2014-06-15 DIAGNOSIS — E039 Hypothyroidism, unspecified: Secondary | ICD-10-CM | POA: Diagnosis not present

## 2014-06-15 DIAGNOSIS — M791 Myalgia: Secondary | ICD-10-CM | POA: Insufficient documentation

## 2014-06-15 DIAGNOSIS — Z87440 Personal history of urinary (tract) infections: Secondary | ICD-10-CM | POA: Insufficient documentation

## 2014-06-15 DIAGNOSIS — R410 Disorientation, unspecified: Secondary | ICD-10-CM | POA: Diagnosis not present

## 2014-06-15 DIAGNOSIS — G309 Alzheimer's disease, unspecified: Secondary | ICD-10-CM | POA: Insufficient documentation

## 2014-06-15 DIAGNOSIS — Z862 Personal history of diseases of the blood and blood-forming organs and certain disorders involving the immune mechanism: Secondary | ICD-10-CM | POA: Diagnosis not present

## 2014-06-15 DIAGNOSIS — I200 Unstable angina: Secondary | ICD-10-CM | POA: Insufficient documentation

## 2014-06-15 DIAGNOSIS — Z8709 Personal history of other diseases of the respiratory system: Secondary | ICD-10-CM | POA: Insufficient documentation

## 2014-06-15 DIAGNOSIS — M1990 Unspecified osteoarthritis, unspecified site: Secondary | ICD-10-CM | POA: Diagnosis not present

## 2014-06-15 DIAGNOSIS — R4182 Altered mental status, unspecified: Secondary | ICD-10-CM

## 2014-06-15 DIAGNOSIS — R05 Cough: Secondary | ICD-10-CM | POA: Diagnosis not present

## 2014-06-15 DIAGNOSIS — Z87891 Personal history of nicotine dependence: Secondary | ICD-10-CM | POA: Insufficient documentation

## 2014-06-15 DIAGNOSIS — Z8619 Personal history of other infectious and parasitic diseases: Secondary | ICD-10-CM | POA: Diagnosis not present

## 2014-06-15 DIAGNOSIS — I129 Hypertensive chronic kidney disease with stage 1 through stage 4 chronic kidney disease, or unspecified chronic kidney disease: Secondary | ICD-10-CM | POA: Insufficient documentation

## 2014-06-15 DIAGNOSIS — R059 Cough, unspecified: Secondary | ICD-10-CM

## 2014-06-15 DIAGNOSIS — Z86010 Personal history of colonic polyps: Secondary | ICD-10-CM | POA: Insufficient documentation

## 2014-06-15 DIAGNOSIS — R51 Headache: Secondary | ICD-10-CM | POA: Diagnosis not present

## 2014-06-15 DIAGNOSIS — R531 Weakness: Secondary | ICD-10-CM | POA: Diagnosis not present

## 2014-06-15 DIAGNOSIS — I4510 Unspecified right bundle-branch block: Secondary | ICD-10-CM | POA: Diagnosis not present

## 2014-06-15 LAB — CBC WITH DIFFERENTIAL/PLATELET
BASOS PCT: 0 % (ref 0–1)
Basophils Absolute: 0 10*3/uL (ref 0.0–0.1)
Eosinophils Absolute: 0.1 10*3/uL (ref 0.0–0.7)
Eosinophils Relative: 1 % (ref 0–5)
HCT: 37.2 % (ref 36.0–46.0)
HEMOGLOBIN: 12.9 g/dL (ref 12.0–15.0)
Lymphocytes Relative: 22 % (ref 12–46)
Lymphs Abs: 2.2 10*3/uL (ref 0.7–4.0)
MCH: 29.9 pg (ref 26.0–34.0)
MCHC: 34.7 g/dL (ref 30.0–36.0)
MCV: 86.1 fL (ref 78.0–100.0)
MONO ABS: 0.4 10*3/uL (ref 0.1–1.0)
MONOS PCT: 4 % (ref 3–12)
NEUTROS ABS: 7.2 10*3/uL (ref 1.7–7.7)
NEUTROS PCT: 73 % (ref 43–77)
PLATELETS: 249 10*3/uL (ref 150–400)
RBC: 4.32 MIL/uL (ref 3.87–5.11)
RDW: 12.3 % (ref 11.5–15.5)
WBC: 9.9 10*3/uL (ref 4.0–10.5)

## 2014-06-15 LAB — URINALYSIS, ROUTINE W REFLEX MICROSCOPIC
BILIRUBIN URINE: NEGATIVE
Glucose, UA: NEGATIVE mg/dL
Ketones, ur: NEGATIVE mg/dL
Nitrite: NEGATIVE
PH: 5 (ref 5.0–8.0)
Protein, ur: NEGATIVE mg/dL
Specific Gravity, Urine: 1.014 (ref 1.005–1.030)
UROBILINOGEN UA: 0.2 mg/dL (ref 0.0–1.0)

## 2014-06-15 LAB — URINE MICROSCOPIC-ADD ON

## 2014-06-15 LAB — COMPREHENSIVE METABOLIC PANEL
ALT: 21 U/L (ref 0–35)
AST: 22 U/L (ref 0–37)
Albumin: 3.8 g/dL (ref 3.5–5.2)
Alkaline Phosphatase: 81 U/L (ref 39–117)
Anion gap: 11 (ref 5–15)
BILIRUBIN TOTAL: 0.9 mg/dL (ref 0.3–1.2)
BUN: 10 mg/dL (ref 6–23)
CALCIUM: 9.3 mg/dL (ref 8.4–10.5)
CHLORIDE: 106 meq/L (ref 96–112)
CO2: 24 mmol/L (ref 19–32)
CREATININE: 0.93 mg/dL (ref 0.50–1.10)
GFR calc non Af Amer: 52 mL/min — ABNORMAL LOW (ref >90)
GFR, EST AFRICAN AMERICAN: 60 mL/min — AB (ref >90)
Glucose, Bld: 109 mg/dL — ABNORMAL HIGH (ref 70–99)
POTASSIUM: 3.8 mmol/L (ref 3.5–5.1)
SODIUM: 141 mmol/L (ref 135–145)
Total Protein: 6.8 g/dL (ref 6.0–8.3)

## 2014-06-15 MED ORDER — ACETAMINOPHEN 500 MG PO TABS
500.0000 mg | ORAL_TABLET | Freq: Once | ORAL | Status: AC
  Administered 2014-06-15: 500 mg via ORAL
  Filled 2014-06-15: qty 1

## 2014-06-15 NOTE — ED Notes (Addendum)
Pt arrives via [facility], pt called out stating that pt c/o left arm pain earlier and was more confused than usual. Has hx of alzheimers. Pt denied arm pain to ems, reporting back pain at this time. Pt alert, oriented to person, place, not sure of month or year. Pt able to recognize and name daugheter, can recognize son and another friend, but unable to verbalize their names. NAD

## 2014-06-15 NOTE — ED Notes (Signed)
Patient transported to X-ray 

## 2014-06-15 NOTE — ED Provider Notes (Signed)
81 year old female who lives 2 doors down from her family members at an apartment complex, they found that she was increasingly confused today, she did not recognize them briefly, this was short-lived, and then she was able to recognize them and was closer to baseline. She had one episode of vomiting prior to the paramedics arrival. The family states that this was increased amounts of confusion compared to baseline. On exam the patient is awake, alert, follows commands, [place], she does not know where she was born, she is aware of the people in the room. She follows commands without difficulty, has clear heart and lung sounds and has normal vital signs. Check for sources of altered mental status including blood work, urinalysis, CT scan, the patient does not appear in any acute distress. I anticipate discharge if no definite etiology is found.   EKG Interpretation  Date/Time:  Thursday June 15 2014 20:29:50 EST Ventricular Rate:  64 PR Interval:  198 QRS Duration: 157 QT Interval:  434 QTC Calculation: 448 R Axis:   -58 Text Interpretation:  Sinus rhythm Atrial premature complex RBBB and LAFB Inferior infarct, old Since last tracing rate slower Abnormal ekg Confirmed by Danissa Rundle  MD, Gervis Gaba ([id number]) on 06/15/2014 8:36:27 PM        Medical screening examination/treatment/procedure(s) were conducted as a shared visit with non-physician practitioner(s) and myself.  I personally evaluated the patient during the encounter.  Clinical Impression:   Final diagnoses:  Cough  Altered mental status  Altered mental state         Vida RollerBrian D Kobie Whidby, MD 06/16/14 [phone]

## 2014-06-15 NOTE — ED Provider Notes (Signed)
CSN: [id number]     Arrival date & time 06/15/14  2027 History   First MD Initiated Contact with Patient 06/15/14 2028     Chief Complaint  Patient presents with  . Altered Mental Status     (Consider location/radiation/quality/duration/timing/severity/associated sxs/prior Treatment) HPI Comments: Patient is a 81 year old female with a past medical history of Alzhemier's dementia, arthritis, CKD, and hypertension who presents with altered mental status since today. Patient's family is present who provides the history. Patient is often confused according to the family but today the confusion acutely worsened. Patient was saying she wanted to go home when she was at home. She also was complaining about her arms hurting. No injuries or falls recently. Patient reports having a slight cough lately. No other associated symptoms. No aggravating/alleviating factors.    Past Medical History  Diagnosis Date  . Arthritis   . Chicken pox   . Chronic kidney disease   . Colon polyps   . Hypertension   . Dementia   . Angina   . Dysrhythmia   . Heart murmur   . Shortness of breath   . Recurrent upper respiratory infection (URI)   . Pneumonia   . Hypothyroidism   . Anemia   . Headache(784.0)   . Anxiety   . Depression   . Valvular heart disease 09/04/2012  . Left leg pain 10/02/2012  . UTI (urinary tract infection) 12/30/2012  . Acute bronchitis 04/10/2013  . Dehydration 08/31/2013  . Arthritis 10/02/2012   Past Surgical History  Procedure Laterality Date  . Appendectomy    . Cholecystectomy    . Kidney stones    . Tonsillectomy    . Abdominal hysterectomy    . Hand surgery    . Feet surgery    . Shoulder arthroscopy    . Back surgery    . Tonsillectomy    . Eye surgery      cataract removed and eye lids lifted  . Fracture surgery      bilateral arms  . Tubal ligation    . Colonoscopy w/ polypectomy     Family History  Problem Relation Age of Onset  . Heart disease Mother   . Heart  disease Father   . Heart disease Other   . Birth defects Other    History  Substance Use Topics  . Smoking status: Former Smoker -- 0.20 packs/day for 1 years    Types: Cigarettes    Quit date: 06/02/1941  . Smokeless tobacco: Never Used  . Alcohol Use: No   OB History    No data available     Review of Systems  Constitutional: Negative for fever, chills and fatigue.  HENT: Negative for trouble swallowing.   Eyes: Negative for visual disturbance.  Respiratory: Negative for shortness of breath.   Cardiovascular: Negative for chest pain and palpitations.  Gastrointestinal: Negative for nausea, vomiting, abdominal pain and diarrhea.  Genitourinary: Negative for dysuria and difficulty urinating.  Musculoskeletal: Positive for myalgias. Negative for arthralgias and neck pain.  Skin: Negative for color change.  Neurological: Negative for dizziness and weakness.  Psychiatric/Behavioral: Positive for confusion. Negative for dysphoric mood.      Allergies  Codeine and Hydrocodone  Home Medications   Prior to Admission medications   Medication Sig Start Date End Date Taking? Authorizing Provider  acetaminophen (TYLENOL) 325 MG tablet Take 650 mg by mouth every 6 (six) hours as needed for moderate pain.    Historical Provider, MD  albuterol (PROVENTIL HFA;VENTOLIN HFA) 108 (90 BASE) MCG/ACT inhaler Inhale 2 puffs into the lungs every 6 (six) hours as needed for wheezing or shortness of breath. 09/23/13   Sandford Craze, NP  aspirin 81 MG tablet Take 81 mg by mouth daily.    Historical Provider, MD  donepezil (ARICEPT) 5 MG tablet Take 1 tablet (5 mg total) by mouth at bedtime. 06/08/14   Judie Bonus, MD  ketoconazole (NIZORAL) 2 % cream Apply 1 application topically daily. 04/06/14   Bradd Canary, MD  levothyroxine (SYNTHROID, LEVOTHROID) 50 MCG tablet TAKE 1 TABLET (50 MCG TOTAL) BY MOUTH DAILY. 04/13/14   Bradd Canary, MD  LORazepam (ATIVAN) 1 MG tablet Take 0.5 mg by  mouth every 8 (eight) hours as needed for anxiety.    Historical Provider, MD  losartan (COZAAR) 50 MG tablet TAKE 0.5 TABLETS (25 MG TOTAL) BY MOUTH DAILY. 06/13/14   Bradd Canary, MD  Magnesium 250 MG TABS Take 250 mg by mouth daily.     Historical Provider, MD   BP 154/54 mmHg  Pulse 62  Resp 16  SpO2 96% Physical Exam  Constitutional: She is oriented to person, place, and time. She appears well-developed and well-nourished. No distress.  HENT:  Head: Normocephalic and atraumatic.  Eyes: Conjunctivae and EOM are normal. Pupils are equal, round, and reactive to light.  Neck: Normal range of motion.  Cardiovascular: Normal rate and regular rhythm.  Exam reveals no gallop and no friction rub.   No murmur heard. Pulmonary/Chest: Effort normal and breath sounds normal. She has no wheezes. She has no rales. She exhibits no tenderness.  Abdominal: Soft. She exhibits no distension. There is no tenderness. There is no rebound.  Musculoskeletal: Normal range of motion.  No obvious joint deformity of arms bilaterally. Full ROM. No tenderness to palpation.   Neurological: She is alert and oriented to person, place, and time. No cranial nerve deficit. Coordination normal.  Extremity strength and sensation equal and intact bilaterally. Speech is goal-oriented. Moves limbs without ataxia.   Skin: Skin is warm and dry.  Psychiatric: She has a normal mood and affect. Her behavior is normal.  Nursing note and vitals reviewed.   ED Course  Procedures (including critical care time) Labs Review Labs Reviewed  COMPREHENSIVE METABOLIC PANEL - Abnormal; Notable for the following:    Glucose, Bld 109 (*)    GFR calc non Af Amer 52 (*)    GFR calc Af Amer 60 (*)    All other components within normal limits  URINALYSIS, ROUTINE W REFLEX MICROSCOPIC - Abnormal; Notable for the following:    APPearance CLOUDY (*)    Hgb urine dipstick SMALL (*)    Leukocytes, UA LARGE (*)    All other components  within normal limits  URINE MICROSCOPIC-ADD ON - Abnormal; Notable for the following:    Bacteria, UA FEW (*)    Casts HYALINE CASTS (*)    All other components within normal limits  CBC WITH DIFFERENTIAL    Imaging Review Dg Chest 2 View  06/15/2014   CLINICAL DATA:  Acute onset altered mental status. Generalized weakness and confusion. Initial encounter.  EXAM: CHEST  2 VIEW  COMPARISON:  Chest radiograph performed 11/18/2013  FINDINGS: The lungs are well-aerated. Blunting of the left costophrenic angle could reflect a small left pleural effusion, not well characterized on the lateral view. The lungs are otherwise grossly clear. There is no evidence of pneumothorax.  The heart is borderline  normal in size. No acute osseous abnormalities are seen. The patient is status post right-sided rotator cuff repair.  IMPRESSION: Blunting of the left costophrenic angle could reflect a small left pleural effusion. Lungs otherwise clear.   Electronically Signed   By: Roanna RaiderJeffery  Chang M.D.   On: 06/15/2014 21:50   Ct Head Wo Contrast  06/16/2014   CLINICAL DATA:  Acute onset of confusion. Frontal headache. Initial encounter.  EXAM: CT HEAD WITHOUT CONTRAST  TECHNIQUE: Contiguous axial images were obtained from the base of the skull through the vertex without intravenous contrast.  COMPARISON:  CT of the head performed 11/21/2013  FINDINGS: There is no evidence of acute infarction, mass lesion, or intra- or extra-axial hemorrhage on CT.  Prominence of the ventricles and sulci reflects mild cortical volume loss. Mild cerebellar atrophy is noted. Scattered periventricular and subcortical white matter change likely reflects small vessel ischemic microangiopathy.  The brainstem and fourth ventricle are within normal limits. The basal ganglia are unremarkable in appearance. The cerebral hemispheres demonstrate grossly normal gray-white differentiation. No mass effect or midline shift is seen.  There is no evidence of  fracture; visualized osseous structures are unremarkable in appearance. The orbits are within normal limits. The paranasal sinuses and mastoid air cells are well-aerated. No significant soft tissue abnormalities are seen.  IMPRESSION: 1. No acute intracranial pathology seen on CT. 2. Mild cortical volume loss and scattered small vessel ischemic microangiopathy.   Electronically Signed   By: Roanna RaiderJeffery  Chang M.D.   On: 06/16/2014 01:47     EKG Interpretation   Date/Time:  Thursday June 15 2014 20:29:50 EST Ventricular Rate:  64 PR Interval:  198 QRS Duration: 157 QT Interval:  434 QTC Calculation: 448 R Axis:   -58 Text Interpretation:  Sinus rhythm Atrial premature complex RBBB and LAFB  Inferior infarct, old Since last tracing rate slower Abnormal ekg  Confirmed by MILLER  MD, BRIAN ([id number]) on 06/15/2014 8:36:27 PM      MDM   Final diagnoses:  Cough  Altered mental status   8:53 PM Labs, urinalysis, and chest xray pending. Vitals stable and patient afebrile. No neuro deficits. Patient alert and oriented.   2:34 AM Labs, urinalysis, and CT head unremarkable for acute changes. Vitals stable and patient afebrile. Patient will be discharged with instructions to return with worsening or concerning symptoms. Dr. Hyacinth MeekerMiller saw the patient and agrees with plan.   Emilia BeckKaitlyn Dennys Traughber, PA-C 06/16/14 0235  Vida RollerBrian D Miller, MD 06/16/14 [phone]

## 2014-06-16 ENCOUNTER — Emergency Department (HOSPITAL_COMMUNITY): Payer: Commercial Managed Care - HMO

## 2014-06-16 DIAGNOSIS — E039 Hypothyroidism, unspecified: Secondary | ICD-10-CM | POA: Diagnosis not present

## 2014-06-16 DIAGNOSIS — I129 Hypertensive chronic kidney disease with stage 1 through stage 4 chronic kidney disease, or unspecified chronic kidney disease: Secondary | ICD-10-CM | POA: Diagnosis not present

## 2014-06-16 DIAGNOSIS — R51 Headache: Secondary | ICD-10-CM | POA: Diagnosis not present

## 2014-06-16 DIAGNOSIS — R011 Cardiac murmur, unspecified: Secondary | ICD-10-CM | POA: Diagnosis not present

## 2014-06-16 DIAGNOSIS — G309 Alzheimer's disease, unspecified: Secondary | ICD-10-CM | POA: Diagnosis not present

## 2014-06-16 DIAGNOSIS — R410 Disorientation, unspecified: Secondary | ICD-10-CM | POA: Diagnosis not present

## 2014-06-16 DIAGNOSIS — R05 Cough: Secondary | ICD-10-CM | POA: Diagnosis not present

## 2014-06-16 DIAGNOSIS — N189 Chronic kidney disease, unspecified: Secondary | ICD-10-CM | POA: Diagnosis not present

## 2014-06-16 DIAGNOSIS — M791 Myalgia: Secondary | ICD-10-CM | POA: Diagnosis not present

## 2014-06-16 DIAGNOSIS — R531 Weakness: Secondary | ICD-10-CM | POA: Diagnosis not present

## 2014-06-16 DIAGNOSIS — R4182 Altered mental status, unspecified: Secondary | ICD-10-CM | POA: Diagnosis not present

## 2014-06-16 DIAGNOSIS — I200 Unstable angina: Secondary | ICD-10-CM | POA: Diagnosis not present

## 2014-06-16 IMAGING — CT CT ABD-PELV W/ CM
2 of 5 series · 15 of 46 positions shown, 17 images · IV contrast (omnipaque)
Comparison: Abdominal pelvic CT 10/05/2010.

CLINICAL DATA: Sudden onset of left upper quadrant abdominal pain
with nausea and vomiting. History of appendectomy, cholecystectomy
and hysterectomy.

EXAM:
CT ABDOMEN AND PELVIS WITH CONTRAST
TECHNIQUE: Multidetector CT imaging of the abdomen and pelvis was performed
using the standard protocol following bolus administration of
intravenous contrast.
CONTRAST:  80mL OMNIPAQUE IOHEXOL 300 MG/ML  SOLN

[Series 2: abd/ pelvis 5.0 i30f 1 · axial · 0.91mm/px · z∈[+807,+1217]mm · 12 of 92 slices shown, 14 images]
[im 5/92  soft-tissue]
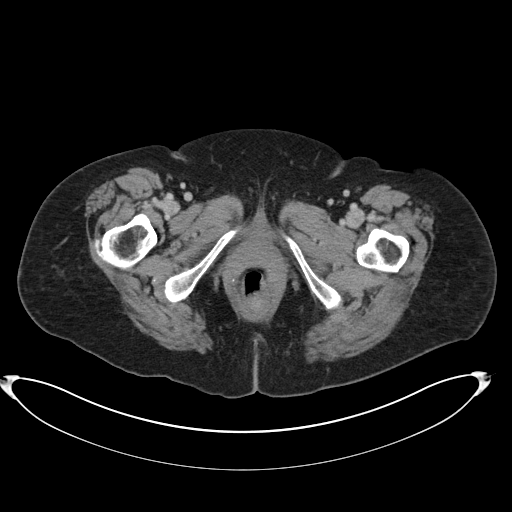
[im 5/92  bone]
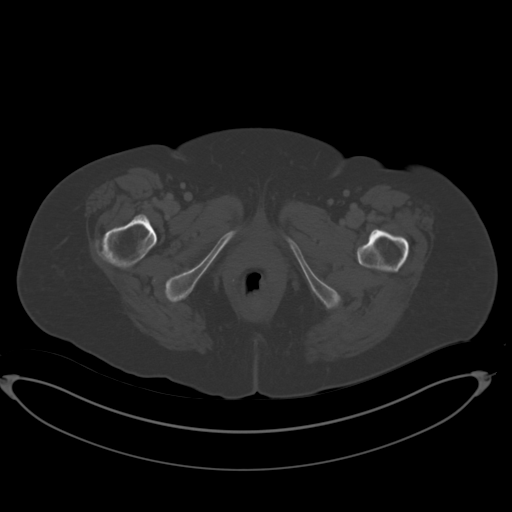
[im 14/92  soft-tissue]
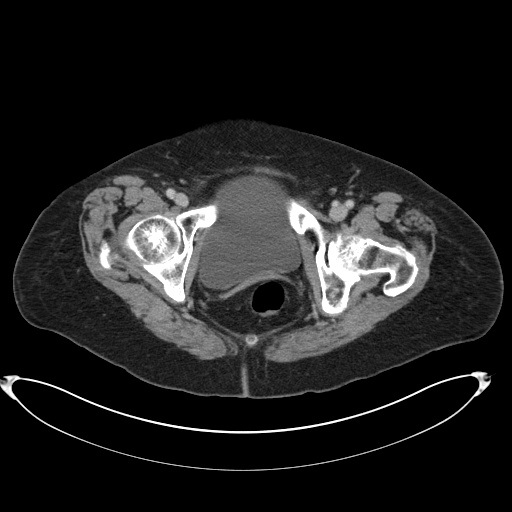
[im 19/92  soft-tissue]
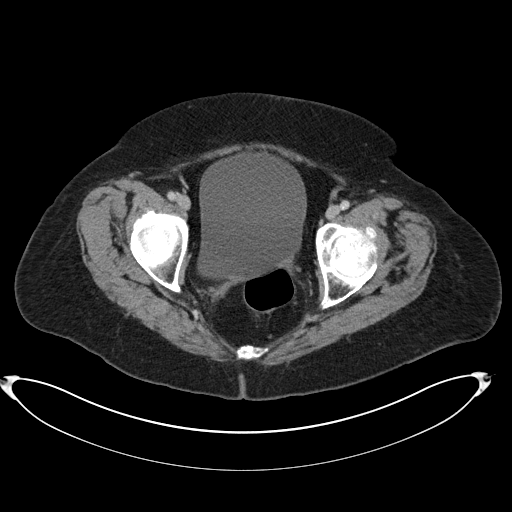
[im 28/92  soft-tissue]
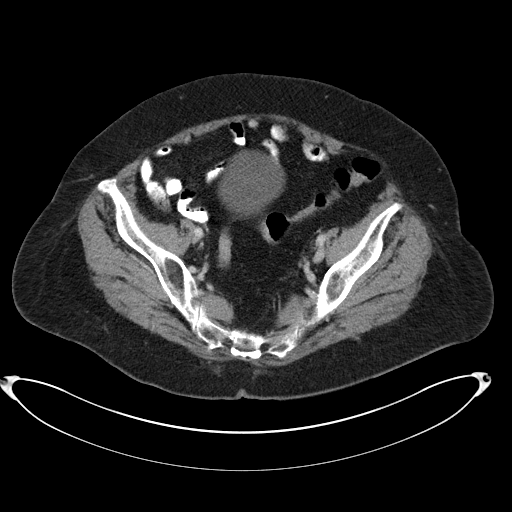
[im 37/92  soft-tissue]
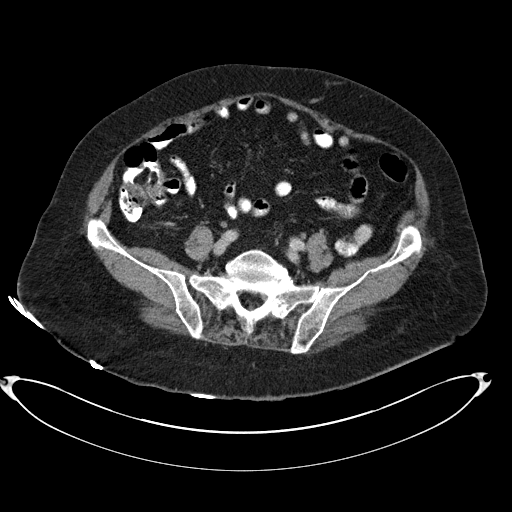
[im 41/92  soft-tissue]
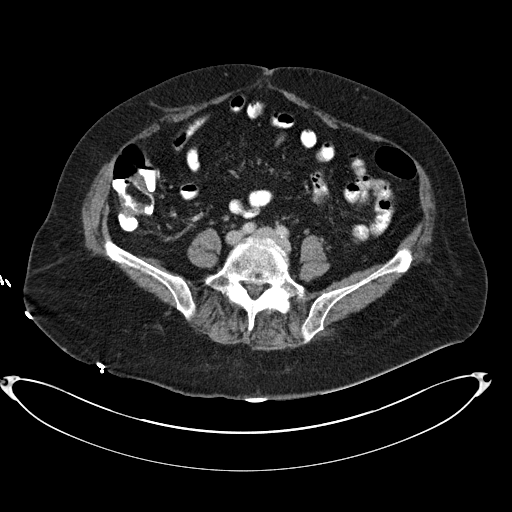
[im 51/92  soft-tissue]
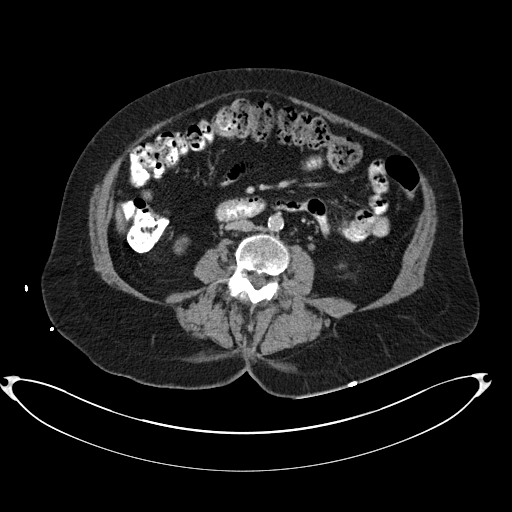
[im 55/92  soft-tissue]
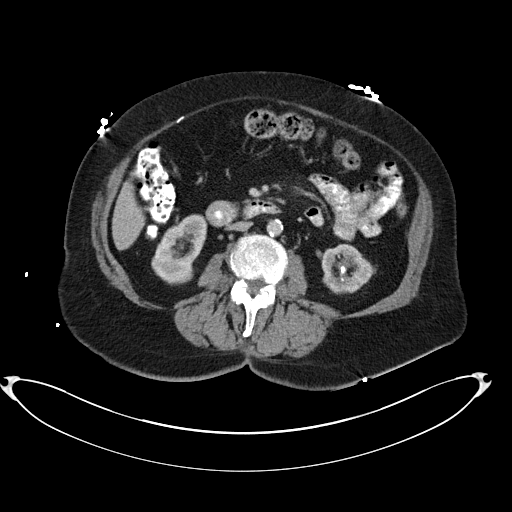
[im 64/92  soft-tissue]
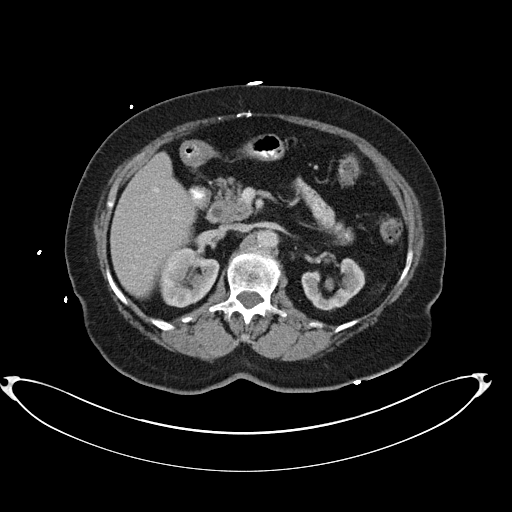
[im 64/92  bone]
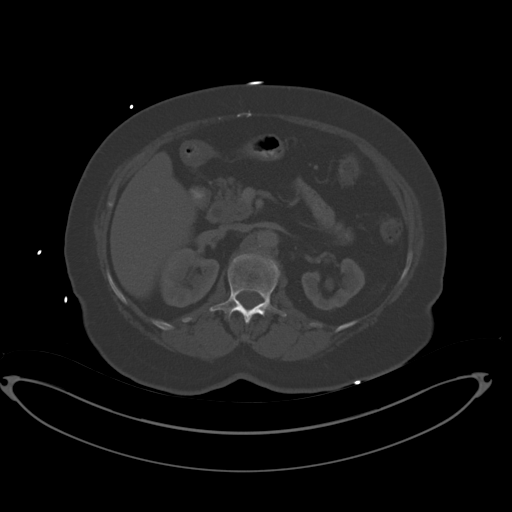
[im 73/92  soft-tissue]
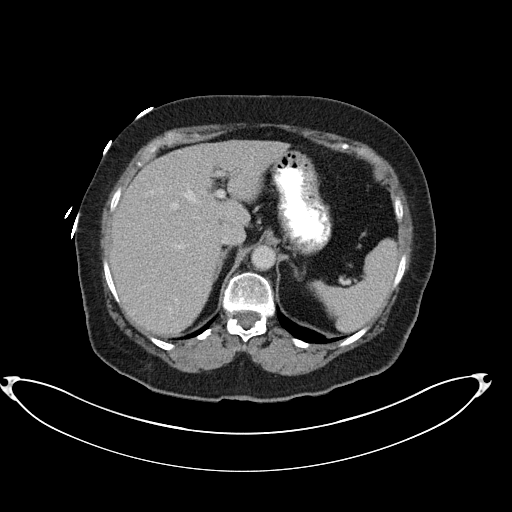
[im 78/92  soft-tissue]
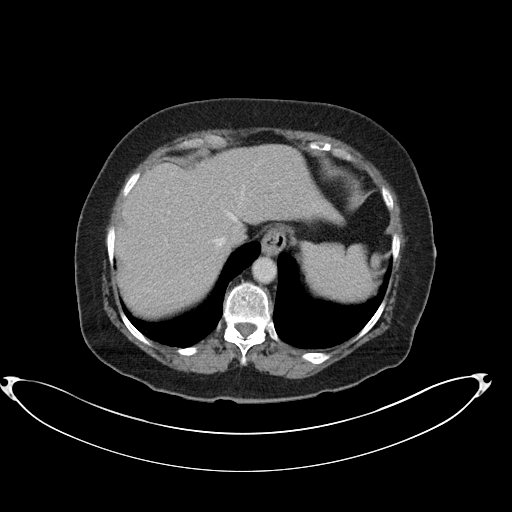
[im 87/92  soft-tissue]
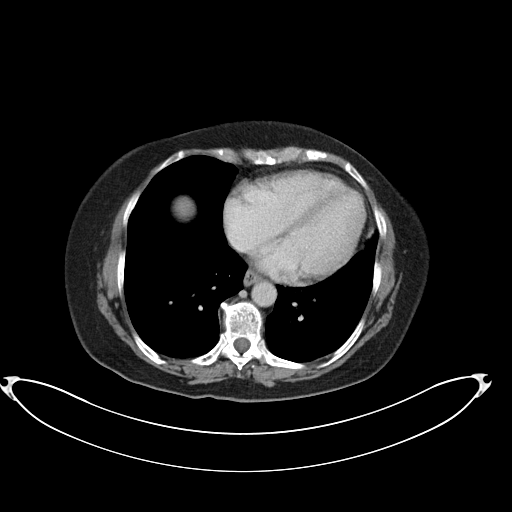

[Series 6: cor · coronal · 0.88mm/px · 3 of 166 slices shown]
[im 56/166  soft-tissue]
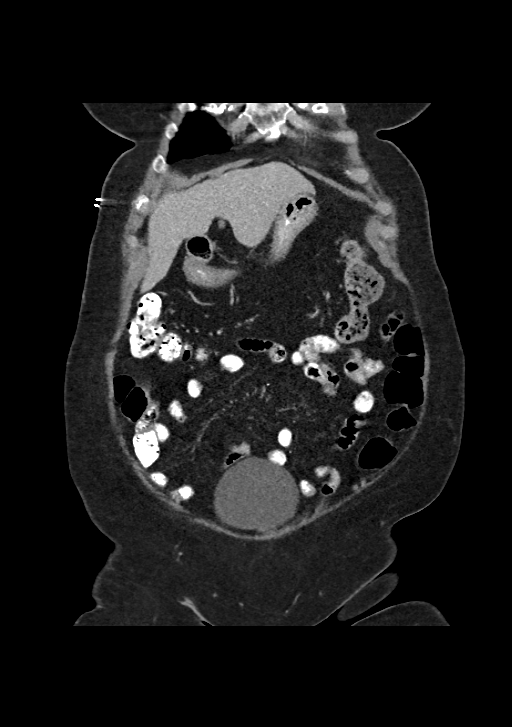
[im 74/166  soft-tissue]
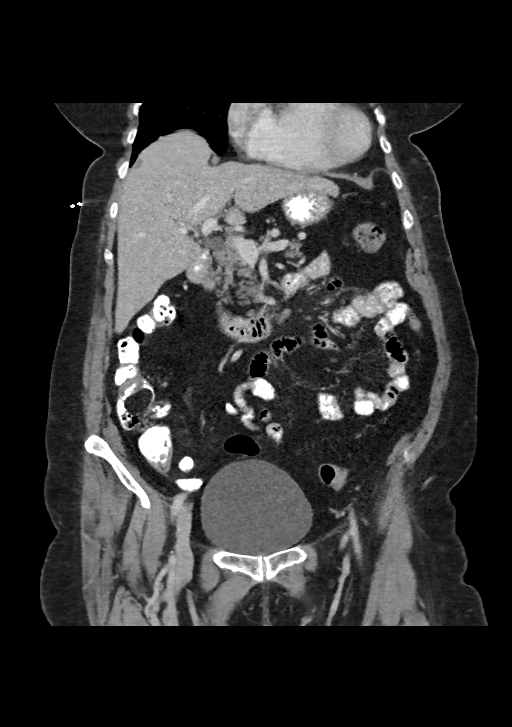
[im 92/166  soft-tissue]
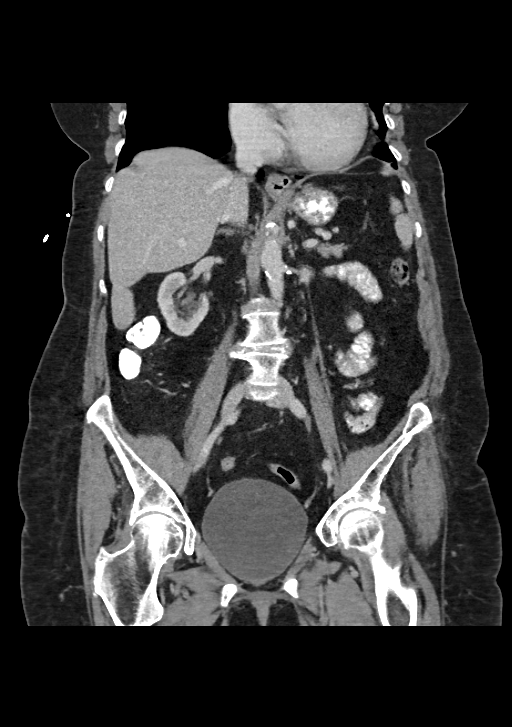

[15 of 46 positions shown; findings below may reference images not displayed]

FINDINGS: The visualized lung bases are clear. There is no significant pleural
or pericardial effusion.

There is stable mild biliary dilatation status post cholecystectomy.
No calcified intraductal calculus is demonstrated. The liver and
spleen appear normal. The pancreas is atrophied with focal
abnormality. There is no adrenal mass.

Again demonstrated are nonobstructing calculi in the lower pole
calyces of the left kidney. Mild dilatation of both renal pelves is
unchanged. On the early images, there is relatively increased
density within both renal pelves. The delayed post contrast images
demonstrate no significant delay in contrast excretion. There is no
evidence of ureteral or bladder calculus. There is no evidence of
renal mass. Small renal cysts are present on the right. There is
bilateral renal cortical thinning.

The stomach and small bowel appear normal. There is a lipoma of the
ileocecal valve. No inflammatory changes are identified. There is no
evidence of pelvic mass or lymphadenopathy status post hysterectomy.
The bladder appears unremarkable.

Lumbar spine degenerative changes are stable. No acute or worrisome
osseous findings are seen.
IMPRESSION: 1. No definite acute abdominal pelvic findings demonstrated. No
explanation for left upper quadrant pain.
2. Stable nonobstructing calculi in the lower pole calyces of the
left kidney. No recurrent ureteral/bladder calculus or obstruction.
There is increased density in both renal pelves on the early images
which could be technical or related to hematuria/urinary tract
infection. Correlation with urine analysis recommended.
3. Stable mild biliary dilatation status post cholecystectomy.

## 2014-06-16 IMAGING — CR DG CHEST 2V
2 series · 2 of 2 positions shown · non-contrast
Comparison: 08/15/2012

CLINICAL DATA: Chest pain.

EXAM:
CHEST  2 VIEW

[w chest lat]
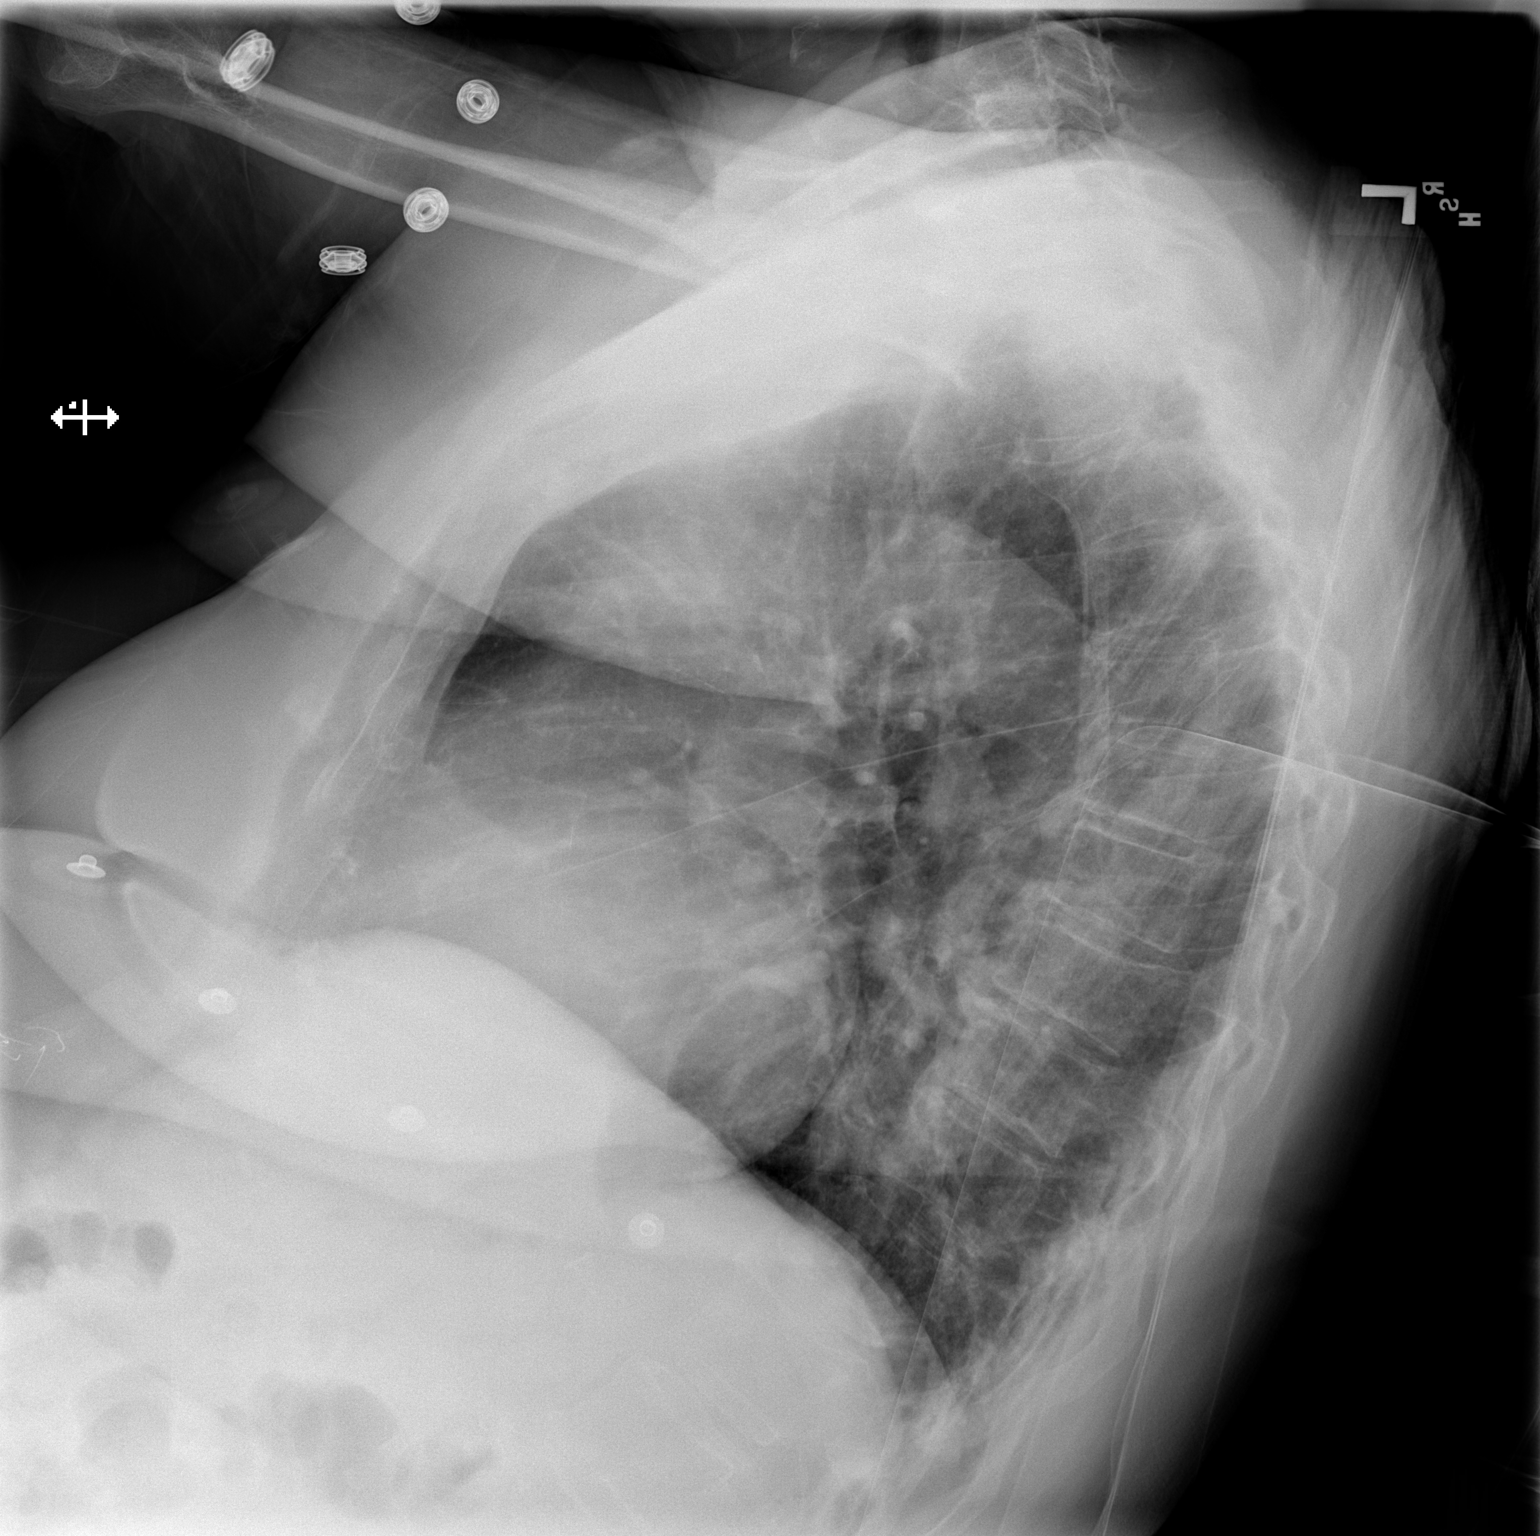

[x chest ap]
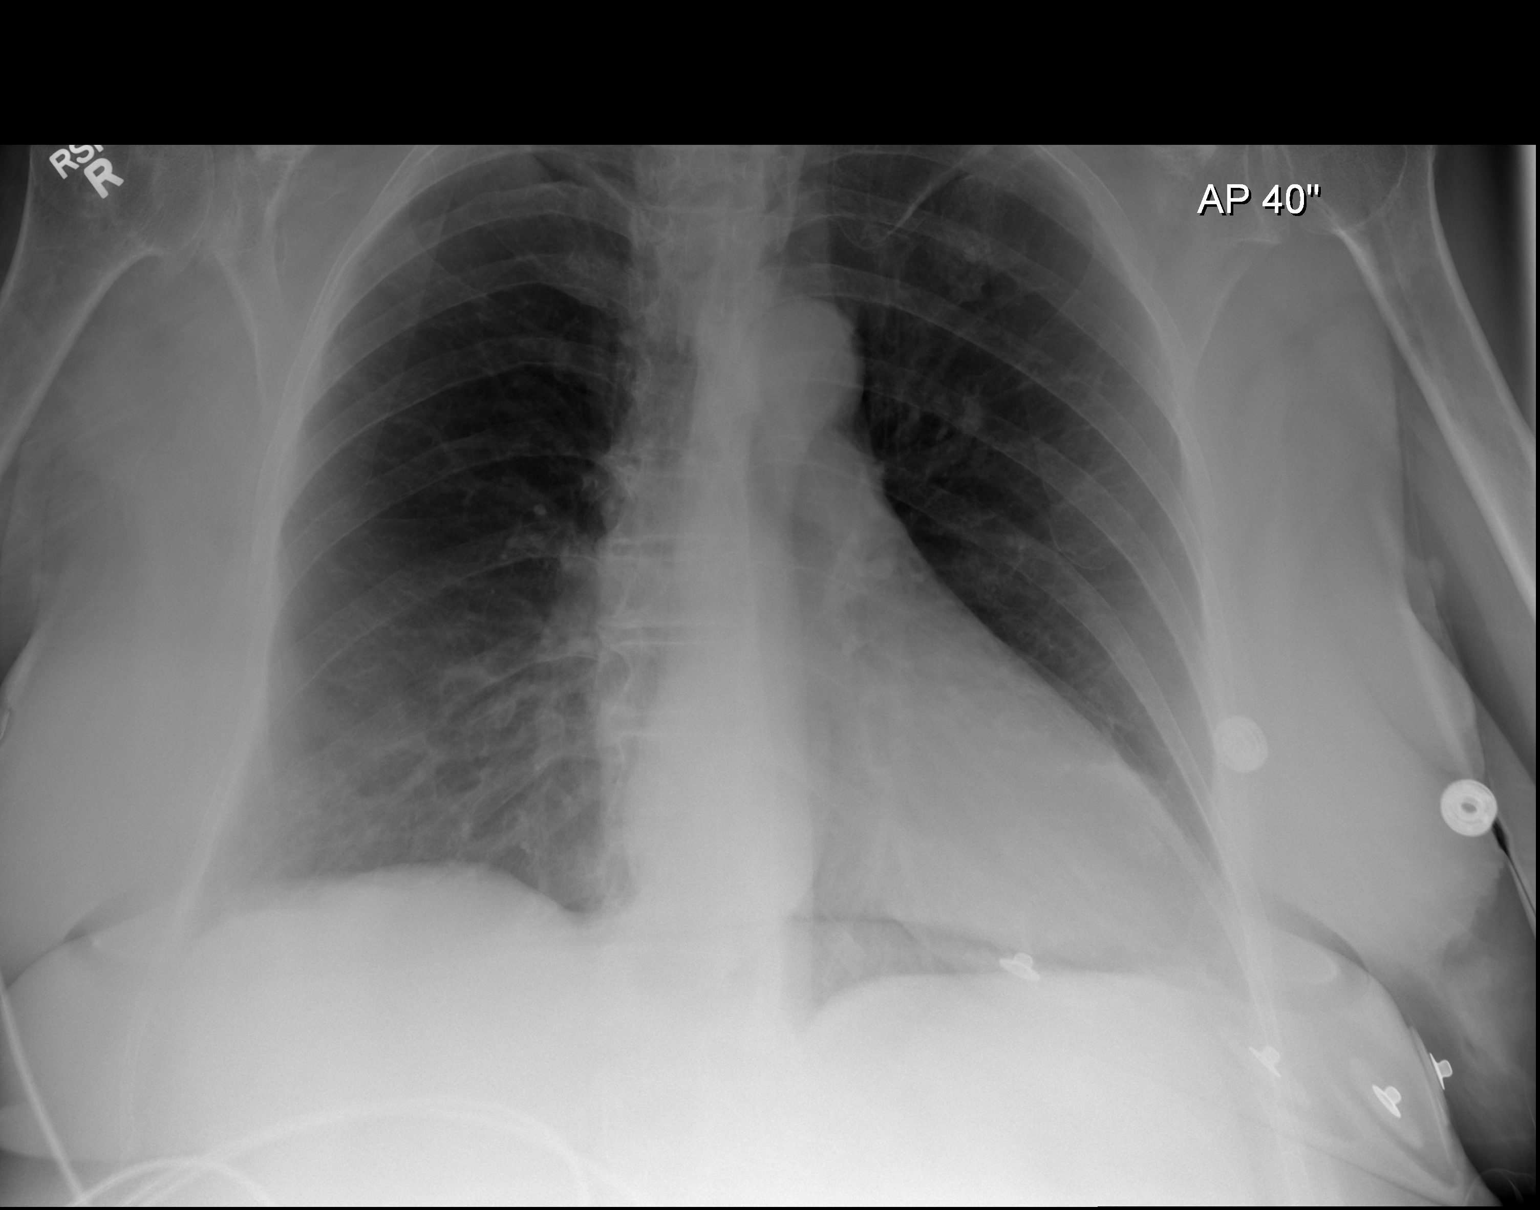

[2 of 2 positions shown; findings below may reference images not displayed]

FINDINGS: Chronic cardiomegaly. Convexity of the lower mediastinal contours
likely relates to leftward rotation. Hyperinflated lungs. No edema,
infiltrate, effusion, or pneumothorax.
IMPRESSION: No active cardiopulmonary disease.

## 2014-06-16 NOTE — Discharge Instructions (Signed)
Patient has no signs of infection today. Refer to attached documents for more information. Return to the ED with worsening or concerning symptoms.

## 2014-07-05 ENCOUNTER — Telehealth: Payer: Self-pay | Admitting: *Deleted

## 2014-07-05 NOTE — Telephone Encounter (Signed)
Received form via mail from [employer] for verification of medical treatment. Form completed and mailed to [address], [place], [place] [id number]. Copy sent for scanning. JG//CMA

## 2014-07-06 ENCOUNTER — Encounter: Payer: Self-pay | Admitting: Internal Medicine

## 2014-07-06 ENCOUNTER — Ambulatory Visit (INDEPENDENT_AMBULATORY_CARE_PROVIDER_SITE_OTHER): Payer: Commercial Managed Care - HMO | Admitting: Internal Medicine

## 2014-07-06 ENCOUNTER — Other Ambulatory Visit (INDEPENDENT_AMBULATORY_CARE_PROVIDER_SITE_OTHER): Payer: Commercial Managed Care - HMO

## 2014-07-06 ENCOUNTER — Ambulatory Visit (INDEPENDENT_AMBULATORY_CARE_PROVIDER_SITE_OTHER)
Admission: RE | Admit: 2014-07-06 | Discharge: 2014-07-06 | Disposition: A | Discharge status: Home / Self Care | Payer: Commercial Managed Care - HMO | Source: Ambulatory Visit | Attending: Internal Medicine | Admitting: Internal Medicine

## 2014-07-06 VITALS — BP 120/56 | HR 79 | Temp 98.6°F | Ht 66.0 in | Wt 177.8 lb

## 2014-07-06 DIAGNOSIS — M79662 Pain in left lower leg: Secondary | ICD-10-CM

## 2014-07-06 DIAGNOSIS — M25572 Pain in left ankle and joints of left foot: Secondary | ICD-10-CM | POA: Diagnosis not present

## 2014-07-06 DIAGNOSIS — M7732 Calcaneal spur, left foot: Secondary | ICD-10-CM | POA: Diagnosis not present

## 2014-07-06 DIAGNOSIS — M25472 Effusion, left ankle: Secondary | ICD-10-CM | POA: Diagnosis not present

## 2014-07-06 DIAGNOSIS — F0390 Unspecified dementia without behavioral disturbance: Secondary | ICD-10-CM

## 2014-07-06 LAB — CK: Total CK: 64 U/L (ref 7–177)

## 2014-07-06 NOTE — Patient Instructions (Signed)
Use an anti-inflammatory cream such as Aspercreme or Zostrix cream twice a day to the affected area as needed. In lieu of this warm moist compresses or  hot water bottle can be used. Do not apply ice . Your next office appointment will be determined based upon review of your pending labs & x-rays. Those instructions will be transmitted to you  by mail Critical values will be called  Followup as needed for your acute issue. Please report any significant change in your symptoms.

## 2014-07-06 NOTE — Progress Notes (Signed)
   Subjective:    Patient ID: [name], female    DOB: 21-Mar-1921, 81 y.o.   MRN: [id number]  HPI  She presents with pain in the left lower extremity from the mid posterior calf on the left to her toes. Apparently she's had this for multiple months. She denies any initial trigger injury such as a sprain. The pain is described a sharp up to level VIII. It will last hours. Physical activity exacerbates it. Tylenol helps to some extent.   She also has intermittent cramping pain in the left calf and swelling of the left ankle. Numbness and tingling are present but not significant  During the review of systems was she was reminded by her daughter in law that she had complained of chest pain. Her response was "sort of in the chest" as she waved her hand over the chest..  Review of Systems   Palpitations, tachycardia, exertional dyspnea, paroxysmal nocturnal dyspnea, claudication or edema are absent.      Objective:   Physical Exam  Pertinent or positive findings include : Weathered facies Heart sounds are markedly distant. There is subtle fusiform changes left ankle. She has pain with range of motion of the ankle, especially with flexion. Pedal pulses are decreased, particularly the posterior tibial pulses. There is trace edema @ the left ankle. Homans sign is negative even to strong compression. She has intention tremor of the right upper extremity. 4 pronged walker employed There is slight tenting of the skin. She is unable to give date ("?2015"), President or Governor's name, or son& daughter in law's anniversary (2/3, yesterday).  General appearance :adequately nourished; in no distress. Eyes: No conjunctival inflammation or scleral icterus is present. Heart:  Normal rate and regular rhythm. S1 and S2 normal without gallop, murmur, click, rub or other extra sounds  Lungs:Chest clear to auscultation; no wheezes, rhonchi,rales ,or rubs present.No increased work of breathing.    Abdomen: bowel sounds normal, soft and non-tender without masses, organomegaly or hernias noted.  No guarding or rebound.  Vascular : all pulses equal ; no bruits present. Skin:Warm & dry.  Intact without suspicious lesions or rashes ; no jaundice  Lymphatic: No lymphadenopathy is noted about the head, neck, axilla Neuro: Strength generally decreased; tone normal.       Assessment & Plan:  #1 ankle & calf pain; doubt DVT or fracture, probable soft tissue injury #2 dementia impacting validity of history See orders

## 2014-07-06 NOTE — Progress Notes (Signed)
Pre visit review using our clinic review tool, if applicable. No additional management support is needed unless otherwise documented below in the visit note. 

## 2014-07-07 ENCOUNTER — Encounter (HOSPITAL_COMMUNITY): Payer: Self-pay | Admitting: Emergency Medicine

## 2014-07-07 ENCOUNTER — Emergency Department (HOSPITAL_COMMUNITY)
Admission: EM | Admit: 2014-07-07 | Discharge: 2014-07-07 | Disposition: A | Discharge status: Home / Self Care | Payer: Commercial Managed Care - HMO | Attending: Emergency Medicine | Admitting: Emergency Medicine

## 2014-07-07 ENCOUNTER — Other Ambulatory Visit: Payer: Self-pay | Admitting: Internal Medicine

## 2014-07-07 DIAGNOSIS — R011 Cardiac murmur, unspecified: Secondary | ICD-10-CM | POA: Diagnosis not present

## 2014-07-07 DIAGNOSIS — Z87440 Personal history of urinary (tract) infections: Secondary | ICD-10-CM | POA: Insufficient documentation

## 2014-07-07 DIAGNOSIS — M79602 Pain in left arm: Secondary | ICD-10-CM

## 2014-07-07 DIAGNOSIS — E039 Hypothyroidism, unspecified: Secondary | ICD-10-CM | POA: Insufficient documentation

## 2014-07-07 DIAGNOSIS — N189 Chronic kidney disease, unspecified: Secondary | ICD-10-CM | POA: Insufficient documentation

## 2014-07-07 DIAGNOSIS — F329 Major depressive disorder, single episode, unspecified: Secondary | ICD-10-CM | POA: Insufficient documentation

## 2014-07-07 DIAGNOSIS — I129 Hypertensive chronic kidney disease with stage 1 through stage 4 chronic kidney disease, or unspecified chronic kidney disease: Secondary | ICD-10-CM | POA: Insufficient documentation

## 2014-07-07 DIAGNOSIS — M79605 Pain in left leg: Secondary | ICD-10-CM | POA: Diagnosis not present

## 2014-07-07 DIAGNOSIS — Z8701 Personal history of pneumonia (recurrent): Secondary | ICD-10-CM | POA: Diagnosis not present

## 2014-07-07 DIAGNOSIS — Z79899 Other long term (current) drug therapy: Secondary | ICD-10-CM | POA: Insufficient documentation

## 2014-07-07 DIAGNOSIS — M79662 Pain in left lower leg: Secondary | ICD-10-CM

## 2014-07-07 DIAGNOSIS — Z862 Personal history of diseases of the blood and blood-forming organs and certain disorders involving the immune mechanism: Secondary | ICD-10-CM | POA: Insufficient documentation

## 2014-07-07 DIAGNOSIS — Z8619 Personal history of other infectious and parasitic diseases: Secondary | ICD-10-CM | POA: Diagnosis not present

## 2014-07-07 DIAGNOSIS — G8929 Other chronic pain: Secondary | ICD-10-CM | POA: Diagnosis not present

## 2014-07-07 DIAGNOSIS — Z87891 Personal history of nicotine dependence: Secondary | ICD-10-CM | POA: Diagnosis not present

## 2014-07-07 DIAGNOSIS — R001 Bradycardia, unspecified: Secondary | ICD-10-CM | POA: Insufficient documentation

## 2014-07-07 DIAGNOSIS — M79606 Pain in leg, unspecified: Secondary | ICD-10-CM

## 2014-07-07 DIAGNOSIS — F419 Anxiety disorder, unspecified: Secondary | ICD-10-CM | POA: Diagnosis not present

## 2014-07-07 DIAGNOSIS — Z7982 Long term (current) use of aspirin: Secondary | ICD-10-CM | POA: Diagnosis not present

## 2014-07-07 DIAGNOSIS — Z8709 Personal history of other diseases of the respiratory system: Secondary | ICD-10-CM | POA: Diagnosis not present

## 2014-07-07 DIAGNOSIS — Z86010 Personal history of colonic polyps: Secondary | ICD-10-CM | POA: Insufficient documentation

## 2014-07-07 DIAGNOSIS — F0390 Unspecified dementia without behavioral disturbance: Secondary | ICD-10-CM | POA: Diagnosis not present

## 2014-07-07 DIAGNOSIS — M1990 Unspecified osteoarthritis, unspecified site: Secondary | ICD-10-CM | POA: Insufficient documentation

## 2014-07-07 DIAGNOSIS — R7989 Other specified abnormal findings of blood chemistry: Secondary | ICD-10-CM

## 2014-07-07 LAB — CBC WITH DIFFERENTIAL/PLATELET
Basophils Absolute: 0 10*3/uL (ref 0.0–0.1)
Basophils Relative: 0 % (ref 0–1)
Eosinophils Absolute: 0.1 10*3/uL (ref 0.0–0.7)
Eosinophils Relative: 2 % (ref 0–5)
HCT: 34.9 % — ABNORMAL LOW (ref 36.0–46.0)
HEMOGLOBIN: 11.7 g/dL — AB (ref 12.0–15.0)
Lymphocytes Relative: 29 % (ref 12–46)
Lymphs Abs: 2.2 10*3/uL (ref 0.7–4.0)
MCH: 29.6 pg (ref 26.0–34.0)
MCHC: 33.5 g/dL (ref 30.0–36.0)
MCV: 88.4 fL (ref 78.0–100.0)
MONOS PCT: 6 % (ref 3–12)
Monocytes Absolute: 0.5 10*3/uL (ref 0.1–1.0)
NEUTROS ABS: 4.9 10*3/uL (ref 1.7–7.7)
Neutrophils Relative %: 63 % (ref 43–77)
Platelets: 229 10*3/uL (ref 150–400)
RBC: 3.95 MIL/uL (ref 3.87–5.11)
RDW: 12.5 % (ref 11.5–15.5)
WBC: 7.6 10*3/uL (ref 4.0–10.5)

## 2014-07-07 LAB — BASIC METABOLIC PANEL
ANION GAP: 7 (ref 5–15)
BUN: 15 mg/dL (ref 6–23)
CHLORIDE: 108 mmol/L (ref 96–112)
CO2: 25 mmol/L (ref 19–32)
CREATININE: 0.92 mg/dL (ref 0.50–1.10)
Calcium: 8.8 mg/dL (ref 8.4–10.5)
GFR calc Af Amer: 61 mL/min — ABNORMAL LOW (ref >90)
GFR, EST NON AFRICAN AMERICAN: 53 mL/min — AB (ref >90)
Glucose, Bld: 105 mg/dL — ABNORMAL HIGH (ref 70–99)
Potassium: 3.7 mmol/L (ref 3.5–5.1)
SODIUM: 140 mmol/L (ref 135–145)

## 2014-07-07 LAB — D-DIMER, QUANTITATIVE: D-Dimer, Quant: 0.86 ug/mL-FEU — ABNORMAL HIGH (ref 0.00–0.48)

## 2014-07-07 NOTE — ED Notes (Signed)
Pt being transported out of the department for testing; visitors waiting in room

## 2014-07-07 NOTE — ED Notes (Signed)
Pt has returned back to the department from testing; pt placed back on monitor, continuous pulse oximetry and blood pressure cuff; visitors at bedside

## 2014-07-07 NOTE — ED Notes (Signed)
Pt's son asking to speak with a chaplain; informed Diplomatic Services operational officersecretary

## 2014-07-07 NOTE — Progress Notes (Signed)
[facility]

## 2014-07-07 NOTE — Progress Notes (Signed)
Patient was referred for NHP. Patient is ambulating very well and with minimal assist which will not qualify her for skilled rehab at this time. Patient also has current dx of Dementia with rapid short term memory impairment.  Discussed options with son and his wife.  Family at this time is wanting to stay close to patient and not open to being placed outside of the county and unknown if medicaid is LTC per family.  At this time family would like Home health coming into apartment using PT, SW, and RN.    LCSW referred to care management to arrange home health.  Family is agreeable to plan, report exhaustion with trying to patient to eat and take her medications as she remains stubborn.  LCSW discussed ways of encouraging medication with food and incentives. Family reports they will take control over medications in effort to ensure patient is taking what she needs too.  Family appreciative of resources and help at this time.  They report feeling relieved Home Health can support family. Caregivers report they can continue checking in on patient at the apartment.     Disposition discussed with MD and PA who are in agreement.  [facility] is requesting [facility]. Family was encouraged to check on Medicaid and apply for LTC Medicaid if placement is still needed and primary insurance will not pay.   Deretha EmoryHannah Leata Dominy LCSW, MSW Clinical Social Work: Emergency Room [phone]

## 2014-07-07 NOTE — ED Notes (Signed)
Dahlia ClientHannah, Social worker is in speaking with pt and family

## 2014-07-07 NOTE — Progress Notes (Signed)
*  PRELIMINARY RESULTS* Vascular Ultrasound Left lower extremity venous duplex has been completed.  Preliminary findings: no evidence of DVT.   Farrel DemarkJill Eunice, RDMS, RVT  07/07/2014, 9:38 AM

## 2014-07-07 NOTE — ED Notes (Signed)
Pt to ED for evaluation of left lower leg pain that has been ongoing for the past 3 weeks- denies injury to leg.  Pt was seen by PCP yesterday and had blood work and x-ray done.  Pt reports the pain woke her up and she "cannot stand it anymore".  Redness noted to site- pt admits to pain to calf as well.  Distal CNS intact.

## 2014-07-07 NOTE — Discharge Instructions (Signed)
Please follow up closely with your primary care provider for further management of your leg pain.

## 2014-07-07 NOTE — ED Provider Notes (Signed)
Care received from previous provider.  Pt with leg pain, elevated d-dimer awaits venous duplex to r/o DVT.    Family members also express wanting to find out more information about nursing home placement.  WIll consult social work.      Author: Glendale ChardJill I Eunice Service: Vascular Lab Author Type: Cardiovascular Sonographer    Filed: 07/07/2014 9:38 AM Note Time: 07/07/2014 9:38 AM Status: Signed   Editor: Glendale ChardJill I Eunice (Cardiovascular Sonographer)     Expand All Collapse All   *PRELIMINARY RESULTS* Vascular Ultrasound Left lower extremity venous duplex has been completed. Preliminary findings: no evidence of DVT.   Farrel DemarkJill Eunice, RDMS, RVT 07/07/2014, 9:38 AM     10:46 AM Patient has no evidence of DVT. No signs of cellulitis of leg infection, and she is neurovascularly intact. She is able to ambulate. At this time she does not meet admission criteria. Family member is concerned of taking care of her and felt that they are unable to provide adequate care at home. Social worker has seen and evaluated patient and will work on nursing home placement as appropriate.  12:08 PM Face to Face PT/OT and Social Work order has filled.  Pt stable for discharge.  Care discussed with Dr. Rhunette CroftNanavati.   BP 164/59 mmHg  Pulse 58  Temp(Src) 97.7 F (36.5 C) (Oral)  Resp 14  Ht 5\' 5"  (1.651 m)  Wt 177 lb (80.287 kg)  BMI 29.45 kg/m2  SpO2 100%  I have reviewed nursing notes and vital signs. I personally reviewed the imaging tests through PACS system  I reviewed available ER/hospitalization records thought the EMR   Fayrene HelperBowie Braven Wolk, PA-C 07/07/14 1300

## 2014-07-07 NOTE — ED Notes (Signed)
Ordered bfast tray 02/05

## 2014-07-07 NOTE — Discharge Planning (Signed)
CARE MANAGEMENT NOTE 07/07/2014  Patient:  Jeannette CorpusBAME,Anayla P   Account Number:  [id number]  Date Initiated:  07/07/2014  Documentation initiated by:  [facility]Jeren Dufrane  Subjective/Objective Assessment:   81 year old female with a number of comorbidities including [facility] ED, hypertension, heart murmur, anxiety, depression, left leg pain, and arthritis who is in ED for further evaluation of pain in her left lower extremity.// Home alone-son near     Action/Plan:   obtain venous duplex to evaluate for DVT//SNF placement vs HH services.   Anticipated DC Date:  07/07/2014   Anticipated DC Plan:  HOME W HOME HEALTH SERVICES  In-house referral  Clinical Social Worker      DC Planning Services  CM consult      [facility]   Choice offered to / List presented to:  C-4 Adult Children        HH arranged  HH-2 PT  HH-3 OT      [facility]  [facility]   Status of service:  Completed, signed off   Discharge Disposition:  HOME W HOME HEALTH SERVICES   Comments:  Batul Diego J. Lucretia RoersWood, RN, BSN, [employer] [phone] Spoke with pt at bedside regarding [facility]. Offered pt list of home health agencies to choose from.  Pt chose [facility] to render services. [facility].  No DME needs identified at this time.

## 2014-07-07 NOTE — Progress Notes (Signed)
Clinical Social Work Department BRIEF PSYCHOSOCIAL ASSESSMENT 07/07/2014  Patient:  KYLIE, SIMMONDS     Account Number:  [id number]     Admit date:  07/07/2014  Clinical Social Worker:  Forest Gleason  Date/Time:  07/07/2014 08:25 AM  Referred by:  Physician  Date Referred:  07/07/2014 Referred for  Psychosocial assessment  SNF Placement   Other Referral:   Discuss options regarding placement   Interview type:  Family Other interview type:   Spoke with patient at the bedside who is a poor historian with memory deficits.    PSYCHOSOCIAL DATA Living Status:  ALONE Admitted from facility:   Level of care:  Independent Living Primary support name:  Juanda Crumble  son Primary support relationship to patient:  FAMILY Degree of support available:   Patient also has a daughter in law involved and a daughter.  High Support, however family feels she needs more around the clock care that they cannot provide    CURRENT CONCERNS Current Concerns  Post-Acute Placement   Other Concerns:    SOCIAL WORK ASSESSMENT / PLAN LCSW recieved consult from [place] as family requested to speak with social worker about post acute placement options for patient.  Met with daughter in law, son, and patient at the bedside. Family reports they live in [place] area in the same apartment complex. Patient lives on third floor with son and daughter in law 2 doors down.  Patient reports she has been having pain and weakness in her left leg.  She has been to the doctor and here to have it checked out to figure out what is going on. During assessment, it is observed that patient repeates herself due to high anxiety and fear regarding problem with leg. She has hx of dementia per son and is no longer able to drive and has gotten lost several times within the community requiring a police to help find patient.  Family reports they have been the sole caregivers but at this time due to their medical issues and ages, it  is becoming too much and they are interested in hearing more about SNF/long term care.    LCSW provided educaiton about SNF and rehab to patient and family. At this time, we will wait to see what MD decides to do with patient leg as she is awaiting an ultrasound and review.  Unknown if patient will be admitted or discharged from the hospital.  LCSW will begin workup of post acute placement per family request.  Will meet with family again with updated treatment plan.  If patient does not meet criteria for inpatient admission, LCSW will work with family to place her in NH.  If patient meets criteria, patient will move to the unit and case with transfer to unit CSW.  ED LCSW will begin process with FL2, passar, and call to insurance.   Assessment/plan status:  Psychosocial Support/Ongoing Assessment of Needs Other assessment/ plan:   PT/OT to evaluate ADLs and for insurance auth.  Call to [facility] to determine patient benefits.   Information/referral to community resources:   [employer] in [facility]  Call to [facility] for bed placement and bed status    PATIENT'S/FAMILY'S RESPONSE TO PLAN OF CARE: Patient was very fearful and worried about the outcome of the Xrays and ultrasound regarding her leg and problem. She is anxious AEB will not eat her breakfast and repeating herself and worries about what could be wrong and not knowing what is going on.  Patient continues  to fight for independence as she argues with MDs and family about her leg and what she can do.  She reports she misses driving and leaving the house, but reports strong support from family in effort to help her.    Son was very upset about putting "mom" in the nursing home, but LCSW provided support as son has exhausted all resources at home.  Son reports relief hearing there are options and that they could be short term and help patient get stronger.  He is agreeable to treatment plan regarding beginning nursing home process.        Lane Hacker, MSW Clinical Social Work: Emergency Room [phone]

## 2014-07-07 NOTE — ED Notes (Signed)
Ambulated pt around nurses's station; pt ambulated well with minimal assistance using a cane without difficulty or distress; pt is now back on stretcher placed back on monitor, continuous pulse oximetry and blood pressure cuff; son standing outside of room

## 2014-07-07 NOTE — ED Provider Notes (Signed)
CSN: [id number]     Arrival date & time 07/07/14  0431 History   First MD Initiated Contact with Patient 07/07/14 [phone]     Chief Complaint  Patient presents with  . Leg Pain    (Consider location/radiation/quality/duration/timing/severity/associated sxs/prior Treatment) HPI Comments: Patient is a 81 year old female with a number of comorbidities including CK ED, hypertension, heart murmur, anxiety, depression, left leg pain, and arthritis who presents to the emergency department for further evaluation of pain in her left lower extremity. Patient states that she had "a feeling of fire" in her left calf. She states that this pain woke her from sleep. Patient has been having similar symptoms over the past few months; medical chart reveals history of left leg pain since 09/2012. She states the pain has subsided a bit now. She took Tylenol for symptoms without significant improvement. Patient reports noticing that her left ankle and foot were very swollen and red. She doesn't recall what may be symptoms better, but states that much of her swelling has improved and her redness has resolved. Patient saw her primary care doctor yesterday for evaluation of her persistent pain. Patient had a negative x-ray performed at this time. CK and d-dimer levels were also drawn, but patient was not informed of the results. Patient denies any associated fever, N/V, numbness/paresthesias, and extremity weakness.  Patient is a 81 y.o. female presenting with leg pain. The history is provided by the patient. No language interpreter was used.  Leg Pain   Past Medical History  Diagnosis Date  . Arthritis   . Chicken pox   . Chronic kidney disease   . Colon polyps   . Hypertension   . Dementia   . Angina   . Dysrhythmia   . Heart murmur   . Shortness of breath   . Recurrent upper respiratory infection (URI)   . Pneumonia   . Hypothyroidism   . Anemia   . Headache(784.0)   . Anxiety   . Depression   . Valvular  heart disease 09/04/2012  . Left leg pain 10/02/2012  . UTI (urinary tract infection) 12/30/2012  . Acute bronchitis 04/10/2013  . Dehydration 08/31/2013  . Arthritis 10/02/2012   Past Surgical History  Procedure Laterality Date  . Appendectomy    . Cholecystectomy    . Kidney stones    . Tonsillectomy    . Abdominal hysterectomy    . Hand surgery    . Feet surgery    . Shoulder arthroscopy    . Back surgery    . Tonsillectomy    . Eye surgery      cataract removed and eye lids lifted  . Fracture surgery      bilateral arms  . Tubal ligation    . Colonoscopy w/ polypectomy     Family History  Problem Relation Age of Onset  . Heart disease Mother   . Heart disease Father   . Heart disease Other   . Birth defects Other    History  Substance Use Topics  . Smoking status: Former Smoker -- 0.20 packs/day for 1 years    Types: Cigarettes    Quit date: 06/02/1941  . Smokeless tobacco: Never Used  . Alcohol Use: No   OB History    No data available      Review of Systems  Respiratory: Negative for shortness of breath.   Cardiovascular: Positive for leg swelling. Negative for chest pain.  Musculoskeletal: Positive for myalgias.  Neurological: Negative for syncope.  All other systems reviewed and are negative.   Allergies  Codeine and Hydrocodone  Home Medications   Prior to Admission medications   Medication Sig Start Date End Date Taking? Authorizing Provider  acetaminophen (TYLENOL) 325 MG tablet Take 650 mg by mouth every 6 (six) hours as needed for moderate pain.   Yes Historical Provider, MD  albuterol (PROVENTIL HFA;VENTOLIN HFA) 108 (90 BASE) MCG/ACT inhaler Inhale 2 puffs into the lungs every 6 (six) hours as needed for wheezing or shortness of breath. 09/23/13  Yes Sandford Craze, NP  aspirin 81 MG tablet Take 81 mg by mouth daily.   Yes Historical Provider, MD  donepezil (ARICEPT) 5 MG tablet Take 1 tablet (5 mg total) by mouth at bedtime. 06/08/14  Yes  Judie Bonus, MD  levothyroxine (SYNTHROID, LEVOTHROID) 50 MCG tablet TAKE 1 TABLET (50 MCG TOTAL) BY MOUTH DAILY. 04/13/14  Yes Bradd Canary, MD  LORazepam (ATIVAN) 1 MG tablet Take 0.5 mg by mouth every 8 (eight) hours as needed for anxiety or sleep.    Yes Historical Provider, MD  losartan (COZAAR) 50 MG tablet TAKE 0.5 TABLETS (25 MG TOTAL) BY MOUTH DAILY. 06/13/14  Yes Bradd Canary, MD  Magnesium 250 MG TABS Take 250 mg by mouth daily.    Yes Historical Provider, MD  ketoconazole (NIZORAL) 2 % cream Apply 1 application topically daily. Patient not taking: Reported on 07/07/2014 04/06/14   Bradd Canary, MD   BP 145/50 mmHg  Pulse 51  Temp(Src) 97.7 F (36.5 C) (Oral)  Resp 19  Ht  (1.651 m)  Wt 177 lb (80.287 kg)  BMI 29.45 kg/m2  SpO2 99%   Physical Exam  Constitutional: She is oriented to person, place, and time. She appears well-developed and well-nourished. No distress.  Nontoxic/nonseptic appearing  HENT:  Head: Normocephalic and atraumatic.  Eyes: Conjunctivae and EOM are normal. No scleral icterus.  Neck: Normal range of motion.  Cardiovascular: Regular rhythm and intact distal pulses.  Bradycardia present.   DP and PT pulses 2+ in LLE  Pulmonary/Chest: Effort normal. No respiratory distress.  Respirations even and unlabored  Musculoskeletal: Normal range of motion.  Mild soft tissue swelling about the L ankle with normal ROM. TTP of the L calf without palpable cords. No erythema or heat to touch. No pitting edema.  Neurological: She is alert and oriented to person, place, and time. She exhibits normal muscle tone. Coordination normal.  GCS 15. Speech is goal oriented. Patient moves extremities without ataxia. Sensation to light touch intact.  Skin: Skin is warm and dry. No rash noted. She is not diaphoretic. No erythema. No pallor.  Psychiatric: She has a normal mood and affect. Her behavior is normal.  Nursing note and vitals reviewed.   ED Course   Procedures (including critical care time) Labs Review Labs Reviewed  CBC WITH DIFFERENTIAL/PLATELET  BASIC METABOLIC PANEL    Results for orders placed or performed in visit on 07/06/14  CK  Result Value Ref Range   Total CK 64 7 - 177 U/L  D-dimer, quantitative  Result Value Ref Range   D-Dimer, Quant 0.86 (H) 0.00 - 0.48 ug/mL-FEU   Imaging Review Dg Ankle Complete Left  07/06/2014   CLINICAL DATA:  Three weeks of left lateral ankle pain and swelling without known injury.  EXAM: LEFT ANKLE COMPLETE - 3+ VIEW  COMPARISON:  None.  FINDINGS: The bones are adequately mineralized. The ankle joint mortise is preserved. There is minimal swelling  over the lateral malleolus. There is no underlying fracture. The talus and calcaneus are intact. There are plantar and Achilles region spurs.  IMPRESSION: There is mild soft tissue swelling laterally. No underlying bony abnormality is demonstrated.   Electronically Signed   By: David  [place]   On: 07/06/2014 15:33     EKG Interpretation None      MDM   Final diagnoses:  Chronic leg pain, left    81 year old female presents to the emergency department for further evaluation of pain to her left lower extremity. Patient is neurovascularly intact. No palpable cords appreciated. Patient saw her primary care doctor regarding this complaint yesterday. She had a negative x-ray of her ankle at this time. CK value WNL. She did have a d-dimer performed which was elevated at 0.86.  Patient does place out of risk for pulmonary embolism and DVT with age-adjusted d-dimer value. Given her recent admission to the hospital, however, will obtain venous duplex to evaluate for DVT. Suspect that symptoms are secondary to known chronic pain in left lower extremity. This is referenced in the patient's chart as early as May 2014.  Patient with basic labs pending. She is also pending duplex ultrasound. Patient signed out to Fayrene Helper, PA-C at shift change will  follow-up on labs and imaging. Laveda Norman, PA-C to disposition appropriately based on work up. Anticipate discharge with instruction for primary care follow-up if ultrasound imaging is negative today and labs c/w baseline.   Filed Vitals:   07/07/14 0440  BP: 145/50  Pulse: 51  Temp: 97.7 F (36.5 C)  TempSrc: Oral  Resp: 19  Height:  (1.651 m)  Weight: 177 lb (80.287 kg)  SpO2: 99%       Antony Madura, PA-C 07/07/14 4782  Loren Racer, MD 07/07/14 [phone]

## 2014-07-07 NOTE — ED Notes (Signed)
PA at the bedside.

## 2014-07-08 ENCOUNTER — Emergency Department (HOSPITAL_COMMUNITY): Payer: Commercial Managed Care - HMO

## 2014-07-08 ENCOUNTER — Emergency Department (HOSPITAL_COMMUNITY)
Admission: EM | Admit: 2014-07-08 | Discharge: 2014-07-08 | Disposition: A | Discharge status: Home / Self Care | Payer: Commercial Managed Care - HMO | Attending: Emergency Medicine | Admitting: Emergency Medicine

## 2014-07-08 ENCOUNTER — Encounter (HOSPITAL_COMMUNITY): Payer: Self-pay | Admitting: Emergency Medicine

## 2014-07-08 DIAGNOSIS — R42 Dizziness and giddiness: Secondary | ICD-10-CM | POA: Diagnosis not present

## 2014-07-08 DIAGNOSIS — M25511 Pain in right shoulder: Secondary | ICD-10-CM | POA: Diagnosis not present

## 2014-07-08 DIAGNOSIS — S3993XA Unspecified injury of pelvis, initial encounter: Secondary | ICD-10-CM | POA: Diagnosis not present

## 2014-07-08 DIAGNOSIS — Z8701 Personal history of pneumonia (recurrent): Secondary | ICD-10-CM | POA: Insufficient documentation

## 2014-07-08 DIAGNOSIS — M1990 Unspecified osteoarthritis, unspecified site: Secondary | ICD-10-CM | POA: Diagnosis not present

## 2014-07-08 DIAGNOSIS — Z86010 Personal history of colonic polyps: Secondary | ICD-10-CM | POA: Insufficient documentation

## 2014-07-08 DIAGNOSIS — Z87891 Personal history of nicotine dependence: Secondary | ICD-10-CM | POA: Insufficient documentation

## 2014-07-08 DIAGNOSIS — Z8709 Personal history of other diseases of the respiratory system: Secondary | ICD-10-CM | POA: Diagnosis not present

## 2014-07-08 DIAGNOSIS — Z862 Personal history of diseases of the blood and blood-forming organs and certain disorders involving the immune mechanism: Secondary | ICD-10-CM | POA: Insufficient documentation

## 2014-07-08 DIAGNOSIS — E039 Hypothyroidism, unspecified: Secondary | ICD-10-CM | POA: Insufficient documentation

## 2014-07-08 DIAGNOSIS — I129 Hypertensive chronic kidney disease with stage 1 through stage 4 chronic kidney disease, or unspecified chronic kidney disease: Secondary | ICD-10-CM | POA: Insufficient documentation

## 2014-07-08 DIAGNOSIS — S8992XA Unspecified injury of left lower leg, initial encounter: Secondary | ICD-10-CM | POA: Insufficient documentation

## 2014-07-08 DIAGNOSIS — S8991XA Unspecified injury of right lower leg, initial encounter: Secondary | ICD-10-CM | POA: Diagnosis not present

## 2014-07-08 DIAGNOSIS — N189 Chronic kidney disease, unspecified: Secondary | ICD-10-CM | POA: Insufficient documentation

## 2014-07-08 DIAGNOSIS — F0390 Unspecified dementia without behavioral disturbance: Secondary | ICD-10-CM | POA: Insufficient documentation

## 2014-07-08 DIAGNOSIS — S4991XA Unspecified injury of right shoulder and upper arm, initial encounter: Secondary | ICD-10-CM | POA: Diagnosis not present

## 2014-07-08 DIAGNOSIS — S41111A Laceration without foreign body of right upper arm, initial encounter: Secondary | ICD-10-CM

## 2014-07-08 DIAGNOSIS — Z87440 Personal history of urinary (tract) infections: Secondary | ICD-10-CM | POA: Insufficient documentation

## 2014-07-08 DIAGNOSIS — Y998 Other external cause status: Secondary | ICD-10-CM | POA: Diagnosis not present

## 2014-07-08 DIAGNOSIS — W19XXXA Unspecified fall, initial encounter: Secondary | ICD-10-CM

## 2014-07-08 DIAGNOSIS — Z23 Encounter for immunization: Secondary | ICD-10-CM | POA: Insufficient documentation

## 2014-07-08 DIAGNOSIS — S3992XA Unspecified injury of lower back, initial encounter: Secondary | ICD-10-CM | POA: Diagnosis not present

## 2014-07-08 DIAGNOSIS — Y9389 Activity, other specified: Secondary | ICD-10-CM | POA: Insufficient documentation

## 2014-07-08 DIAGNOSIS — S40811A Abrasion of right upper arm, initial encounter: Secondary | ICD-10-CM | POA: Insufficient documentation

## 2014-07-08 DIAGNOSIS — W1811XA Fall from or off toilet without subsequent striking against object, initial encounter: Secondary | ICD-10-CM | POA: Diagnosis not present

## 2014-07-08 DIAGNOSIS — Z79899 Other long term (current) drug therapy: Secondary | ICD-10-CM | POA: Insufficient documentation

## 2014-07-08 DIAGNOSIS — M25562 Pain in left knee: Secondary | ICD-10-CM | POA: Diagnosis not present

## 2014-07-08 DIAGNOSIS — S0083XA Contusion of other part of head, initial encounter: Secondary | ICD-10-CM | POA: Diagnosis not present

## 2014-07-08 DIAGNOSIS — F419 Anxiety disorder, unspecified: Secondary | ICD-10-CM | POA: Insufficient documentation

## 2014-07-08 DIAGNOSIS — Z7982 Long term (current) use of aspirin: Secondary | ICD-10-CM | POA: Insufficient documentation

## 2014-07-08 DIAGNOSIS — F329 Major depressive disorder, single episode, unspecified: Secondary | ICD-10-CM | POA: Diagnosis not present

## 2014-07-08 DIAGNOSIS — M25561 Pain in right knee: Secondary | ICD-10-CM | POA: Diagnosis not present

## 2014-07-08 DIAGNOSIS — R102 Pelvic and perineal pain: Secondary | ICD-10-CM | POA: Diagnosis not present

## 2014-07-08 DIAGNOSIS — Z8619 Personal history of other infectious and parasitic diseases: Secondary | ICD-10-CM | POA: Insufficient documentation

## 2014-07-08 DIAGNOSIS — M545 Low back pain: Secondary | ICD-10-CM | POA: Diagnosis not present

## 2014-07-08 DIAGNOSIS — R011 Cardiac murmur, unspecified: Secondary | ICD-10-CM | POA: Diagnosis not present

## 2014-07-08 DIAGNOSIS — Y9289 Other specified places as the place of occurrence of the external cause: Secondary | ICD-10-CM | POA: Diagnosis not present

## 2014-07-08 DIAGNOSIS — S0990XA Unspecified injury of head, initial encounter: Secondary | ICD-10-CM | POA: Diagnosis not present

## 2014-07-08 DIAGNOSIS — M79601 Pain in right arm: Secondary | ICD-10-CM | POA: Diagnosis not present

## 2014-07-08 MED ORDER — ACETAMINOPHEN 325 MG PO TABS
650.0000 mg | ORAL_TABLET | Freq: Once | ORAL | Status: AC
  Administered 2014-07-08: 650 mg via ORAL
  Filled 2014-07-08: qty 2

## 2014-07-08 MED ORDER — TETANUS-DIPHTH-ACELL PERTUSSIS 5-2.5-18.5 LF-MCG/0.5 IM SUSP
0.5000 mL | Freq: Once | INTRAMUSCULAR | Status: AC
  Administered 2014-07-08: 0.5 mL via INTRAMUSCULAR
  Filled 2014-07-08: qty 0.5

## 2014-07-08 NOTE — ED Provider Notes (Signed)
CSN: [id number]     Arrival date & time 07/08/14  0352 History   First MD Initiated Contact with Patient 07/08/14 0407     Chief Complaint  Patient presents with  . Fall    The patient came from home patient got out of bed and fell.  PTAR does not know if how she fell.       (Consider location/radiation/quality/duration/timing/severity/associated sxs/prior Treatment) HPI  81 year old female with a history of dementia presents after a fall off her toilet. She got up in the middle the night to go to the bathroom and fell while trying to stand back up. She thinks one of her legs gave out. She fell on her right side and injured her right face above her lip as well as her right arm. She was initially concerned she injured her teeth but they all feel intact, bite is normal. She's now also feeling low back pain. Denies a weakness or numbness. Is not sure if she hit her head. She did not lose consciousness. She never felt dizzy or had chest pain. The patient is not currently on blood thinners besides baby aspirin, has not taken recently. Unsure of last tetanus immunization.  Past Medical History  Diagnosis Date  . Arthritis   . Chicken pox   . Chronic kidney disease   . Colon polyps   . Hypertension   . Dementia   . Angina   . Dysrhythmia   . Heart murmur   . Shortness of breath   . Recurrent upper respiratory infection (URI)   . Pneumonia   . Hypothyroidism   . Anemia   . Headache(784.0)   . Anxiety   . Depression   . Valvular heart disease 09/04/2012  . Left leg pain 10/02/2012  . UTI (urinary tract infection) 12/30/2012  . Acute bronchitis 04/10/2013  . Dehydration 08/31/2013  . Arthritis 10/02/2012   Past Surgical History  Procedure Laterality Date  . Appendectomy    . Cholecystectomy    . Kidney stones    . Tonsillectomy    . Abdominal hysterectomy    . Hand surgery    . Feet surgery    . Shoulder arthroscopy    . Back surgery    . Tonsillectomy    . Eye surgery      cataract  removed and eye lids lifted  . Fracture surgery      bilateral arms  . Tubal ligation    . Colonoscopy w/ polypectomy     Family History  Problem Relation Age of Onset  . Heart disease Mother   . Heart disease Father   . Heart disease Other   . Birth defects Other    History  Substance Use Topics  . Smoking status: Former Smoker -- 0.20 packs/day for 1 years    Types: Cigarettes    Quit date: 06/02/1941  . Smokeless tobacco: Never Used  . Alcohol Use: No   OB History    No data available     Review of Systems  HENT: Positive for facial swelling.   Respiratory: Negative for shortness of breath.   Cardiovascular: Negative for chest pain.  Musculoskeletal: Positive for back pain and arthralgias. Negative for neck pain.  Skin: Positive for wound.  Neurological: Negative for syncope, weakness, light-headedness and headaches.  All other systems reviewed and are negative.     Allergies  Codeine and Hydrocodone  Home Medications   Prior to Admission medications   Medication Sig Start Date End  Date Taking? Authorizing Provider  acetaminophen (TYLENOL) 325 MG tablet Take 650 mg by mouth every 6 (six) hours as needed for moderate pain.    Historical Provider, MD  albuterol (PROVENTIL HFA;VENTOLIN HFA) 108 (90 BASE) MCG/ACT inhaler Inhale 2 puffs into the lungs every 6 (six) hours as needed for wheezing or shortness of breath. 09/23/13   Sandford Craze, NP  aspirin 81 MG tablet Take 81 mg by mouth daily.    Historical Provider, MD  donepezil (ARICEPT) 5 MG tablet Take 1 tablet (5 mg total) by mouth at bedtime. 06/08/14   Judie Bonus, MD  ketoconazole (NIZORAL) 2 % cream Apply 1 application topically daily. Patient not taking: Reported on 07/07/2014 04/06/14   Bradd Canary, MD  levothyroxine (SYNTHROID, LEVOTHROID) 50 MCG tablet TAKE 1 TABLET (50 MCG TOTAL) BY MOUTH DAILY. 04/13/14   Bradd Canary, MD  LORazepam (ATIVAN) 1 MG tablet Take 0.5 mg by mouth every 8 (eight)  hours as needed for anxiety or sleep.     Historical Provider, MD  losartan (COZAAR) 50 MG tablet TAKE 0.5 TABLETS (25 MG TOTAL) BY MOUTH DAILY. 06/13/14   Bradd Canary, MD  Magnesium 250 MG TABS Take 250 mg by mouth daily.     Historical Provider, MD   BP 144/84 mmHg  Pulse 63  SpO2 99% Physical Exam  Constitutional: She is oriented to person, place, and time. She appears well-developed and well-nourished.  HENT:  Head: Normocephalic.    Right Ear: External ear normal.  Left Ear: External ear normal.  Nose: Nose normal.  No tenderness to teeth or loose teeth/malocclusion  Eyes: EOM are normal. Pupils are equal, round, and reactive to light. Right eye exhibits no discharge. Left eye exhibits no discharge.  Neck:  No neck tenderness  Cardiovascular: Normal rate, regular rhythm and normal heart sounds.   Pulmonary/Chest: Effort normal and breath sounds normal.  Abdominal: Soft. There is no tenderness.  Musculoskeletal:       Right shoulder: She exhibits tenderness. She exhibits no deformity.       Right hip: She exhibits normal range of motion and no tenderness.       Left knee: Tenderness found.       Lumbar back: She exhibits tenderness (midline tenderness over previous midline lumbar scar).       Right upper arm: She exhibits tenderness and laceration (superficial skin tear).  Neurological: She is alert and oriented to person, place, and time.  Skin: Skin is warm and dry.  Nursing note and vitals reviewed.   ED Course  Procedures (including critical care time) Labs Review Labs Reviewed - No data to display  Imaging Review Dg Lumbar Spine Complete  07/08/2014   CLINICAL DATA:  Lumbar spine pain after fall.  EXAM: LUMBAR SPINE - COMPLETE 4+ VIEW  COMPARISON:  None.  FINDINGS: There is broad-based leftward curvature of the lumbar spine. Bones are under mineralized. Vertebral body heights are maintained. No acute fracture. There is diffuse multilevel degenerative disc disease  with disc space narrowing and endplate spurring throughout the lumbar spine most significant at L3-L4. Multilevel facet arthropathy. Posterior elements are grossly intact.  IMPRESSION: Multilevel degenerative change throughout the lumbar spine without acute fracture.   Electronically Signed   By: Rubye Oaks M.D.   On: 07/08/2014 06:29   Dg Pelvis 1-2 Views  07/08/2014   CLINICAL DATA:  Pelvic pain after fall.  EXAM: PELVIS - 1-2 VIEW  COMPARISON:  None.  FINDINGS: The  cortical margins of the bony pelvis are intact. No fracture. Pubic symphysis and sacroiliac joints are congruent. Both femoral heads are well-seated in the respective acetabula. There is osteoarthritis of both hip joints.  IMPRESSION: No pelvic fracture or dislocation.   Electronically Signed   By: Rubye OaksMelanie  Ehinger M.D.   On: 07/08/2014 06:27   Dg Shoulder Right  07/08/2014   CLINICAL DATA:  Right shoulder pain after fall.  EXAM: RIGHT SHOULDER - 2+ VIEW  COMPARISON:  None.  FINDINGS: No fracture or dislocation. The alignment is maintained. Anchors from prior rotator cuff repair in the humeral head. There is osteoarthritis of the acromioclavicular and glenohumeral joints. No focal soft tissue abnormality.  IMPRESSION: Osteoarthritis and postsurgical change. No acute fracture or dislocation.   Electronically Signed   By: Rubye OaksMelanie  Ehinger M.D.   On: 07/08/2014 06:26   Dg Wrist Complete Right  07/08/2014   CLINICAL DATA:  Right wrist pain after fall.  EXAM: RIGHT WRIST - COMPLETE 3+ VIEW  COMPARISON:  Right forearm radiographs 12/15/2006  FINDINGS: No fracture or dislocation. The alignment is maintained. There is radiocarpal joint space narrowing. The scaphoid is intact. Osseous remottling of the distal ulna at site of prior fracture. No focal soft tissue abnormality.  IMPRESSION: No acute fracture or dislocation of the right wrist.   Electronically Signed   By: Rubye OaksMelanie  Ehinger M.D.   On: 07/08/2014 06:31   Dg Ankle Complete  Left  07/06/2014   CLINICAL DATA:  Three weeks of left lateral ankle pain and swelling without known injury.  EXAM: LEFT ANKLE COMPLETE - 3+ VIEW  COMPARISON:  None.  FINDINGS: The bones are adequately mineralized. The ankle joint mortise is preserved. There is minimal swelling over the lateral malleolus. There is no underlying fracture. The talus and calcaneus are intact. There are plantar and Achilles region spurs.  IMPRESSION: There is mild soft tissue swelling laterally. No underlying bony abnormality is demonstrated.   Electronically Signed   By: David  Swazil[place]   On: 07/06/2014 15:33   Ct Head Wo Contrast  07/08/2014   CLINICAL DATA:  81 year old female fall out of bed. Head and facial trauma.  EXAM: CT HEAD WITHOUT CONTRAST  CT MAXILLOFACIAL WITHOUT CONTRAST  TECHNIQUE: Multidetector CT imaging of the head and maxillofacial structures were performed using the standard protocol without intravenous contrast. Multiplanar CT image reconstructions of the maxillofacial structures were also generated.  COMPARISON:  Head CT 06/16/2014  FINDINGS: CT HEAD FINDINGS  No intracranial hemorrhage, mass effect, or midline shift. No hydrocephalus. The basilar cisterns are patent. No evidence of territorial infarct. No intracranial fluid collection. Generalized age-related atrophy. Mild moderate chronic small vessel ischemia. Calvarium is intact. Included paranasal sinuses and mastoid air cells are well aerated.  CT MAXILLOFACIAL FINDINGS  There are no facial bone fractures. The orbits and globes are intact. There is scattered mucosal thickening involving the left sphenoid sinus and ethmoid air cells. The frontal sinuses are hypo pneumatized. No focal soft tissue abnormality.  IMPRESSION: 1. Atrophy and chronic small vessel ischemic change. No acute intracranial abnormality. 2. No facial bone fracture.   Electronically Signed   By: Rubye OaksMelanie  Ehinger M.D.   On: 07/08/2014 05:45   Dg Knee Complete 4 Views Right  07/08/2014    CLINICAL DATA:  Right knee pain after fall.  EXAM: RIGHT KNEE - COMPLETE 4+ VIEW  COMPARISON:  None.  FINDINGS: No fracture or dislocation. The alignment is maintained. There is chondrocalcinosis. Small peripheral osteophytes. No joint effusion.  IMPRESSION: No acute fracture or dislocation of the right knee.  Mild osteoarthritis and chondrocalcinosis.   Electronically Signed   By: Rubye Oaks M.D.   On: 07/08/2014 06:30   Dg Humerus Right  07/08/2014   CLINICAL DATA:  Right arm pain after fall.  EXAM: RIGHT HUMERUS - 2+ VIEW  COMPARISON:  Concurrently performed shoulder series.  FINDINGS: Cortical margins of the humerus are intact. There is no fracture. Elbow and shoulder alignment is maintained. No focal soft tissue abnormality.  IMPRESSION: Intact right humerus, no fracture.   Electronically Signed   By: Rubye Oaks M.D.   On: 07/08/2014 06:27   Ct Maxillofacial Wo Cm  07/08/2014   CLINICAL DATA:  81 year old female fall out of bed. Head and facial trauma.  EXAM: CT HEAD WITHOUT CONTRAST  CT MAXILLOFACIAL WITHOUT CONTRAST  TECHNIQUE: Multidetector CT imaging of the head and maxillofacial structures were performed using the standard protocol without intravenous contrast. Multiplanar CT image reconstructions of the maxillofacial structures were also generated.  COMPARISON:  Head CT 06/16/2014  FINDINGS: CT HEAD FINDINGS  No intracranial hemorrhage, mass effect, or midline shift. No hydrocephalus. The basilar cisterns are patent. No evidence of territorial infarct. No intracranial fluid collection. Generalized age-related atrophy. Mild moderate chronic small vessel ischemia. Calvarium is intact. Included paranasal sinuses and mastoid air cells are well aerated.  CT MAXILLOFACIAL FINDINGS  There are no facial bone fractures. The orbits and globes are intact. There is scattered mucosal thickening involving the left sphenoid sinus and ethmoid air cells. The frontal sinuses are hypo pneumatized. No focal  soft tissue abnormality.  IMPRESSION: 1. Atrophy and chronic small vessel ischemic change. No acute intracranial abnormality. 2. No facial bone fracture.   Electronically Signed   By: Rubye Oaks M.D.   On: 07/08/2014 05:45     EKG Interpretation None      MDM   Final diagnoses:  Fall, initial encounter  Facial contusion, initial encounter  Skin tear of right upper arm without complication, initial encounter    Patient with a mechanical fall off toilet, has facial contusion and skin tear on arm. NV intact. Xrays and CT benign. Patient able to ambulate and bear weight. Low suspicion for occult fracture. Stable for d/c home.    Audree Camel, MD 07/08/14 [phone]

## 2014-07-08 NOTE — Discharge Instructions (Signed)
Contusion A contusion is a deep bruise. Contusions are the result of an injury that caused bleeding under the skin. The contusion may turn blue, purple, or yellow. Minor injuries will give you a painless contusion, but more severe contusions may stay painful and swollen for a few weeks.  CAUSES  A contusion is usually caused by a blow, trauma, or direct force to an area of the body. SYMPTOMS   Swelling and redness of the injured area.  Bruising of the injured area.  Tenderness and soreness of the injured area.  Pain. DIAGNOSIS  The diagnosis can be made by taking a history and physical exam. An X-ray, CT scan, or MRI may be needed to determine if there were any associated injuries, such as fractures. TREATMENT  Specific treatment will depend on what area of the body was injured. In general, the best treatment for a contusion is resting, icing, elevating, and applying cold compresses to the injured area. Over-the-counter medicines may also be recommended for pain control. Ask your caregiver what the best treatment is for your contusion. HOME CARE INSTRUCTIONS   Put ice on the injured area.  Put ice in a plastic bag.  Place a towel between your skin and the bag.  Leave the ice on for 15-20 minutes, 3-4 times a day, or as directed by your health care provider.  Only take over-the-counter or prescription medicines for pain, discomfort, or fever as directed by your caregiver. Your caregiver may recommend avoiding anti-inflammatory medicines (aspirin, ibuprofen, and naproxen) for 48 hours because these medicines may increase bruising.  Rest the injured area.  If possible, elevate the injured area to reduce swelling. SEEK IMMEDIATE MEDICAL CARE IF:   You have increased bruising or swelling.  You have pain that is getting worse.  Your swelling or pain is not relieved with medicines. MAKE SURE YOU:   Understand these instructions.  Will watch your condition.  Will get help right  away if you are not doing well or get worse. Document Released: 02/26/2005 Document Revised: 05/24/2013 Document Reviewed: 03/24/2011 [facility] Patient Information 2015 [place], [place]. This information is not intended to replace advice given to you by your health care provider. Make sure you discuss any questions you have with your health care provider.    Fall Prevention and Home Safety Falls cause injuries and can affect all age groups. It is possible to use preventive measures to significantly decrease the likelihood of falls. There are many simple measures which can make your home safer and prevent falls. OUTDOORS  Repair cracks and edges of walkways and driveways.  Remove high doorway thresholds.  Trim shrubbery on the main path into your home.  Have good outside lighting.  Clear walkways of tools, rocks, debris, and clutter.  Check that handrails are not broken and are securely fastened. Both sides of steps should have handrails.  Have leaves, snow, and ice cleared regularly.  Use sand or salt on walkways during winter months.  In the garage, clean up grease or oil spills. BATHROOM  Install night lights.  Install grab bars by the toilet and in the tub and shower.  Use non-skid mats or decals in the tub or shower.  Place a plastic non-slip stool in the shower to sit on, if needed.  Keep floors dry and clean up all water on the floor immediately.  Remove soap buildup in the tub or shower on a regular basis.  Secure bath mats with non-slip, double-sided rug tape.  Remove throw rugs  and tripping hazards from the floors. BEDROOMS  Install night lights.  Make sure a bedside light is easy to reach.  Do not use oversized bedding.  Keep a telephone by your bedside.  Have a firm chair with side arms to use for getting dressed.  Remove throw rugs and tripping hazards from the floor. KITCHEN  Keep handles on pots and pans turned toward the center of the stove. Use  back burners when possible.  Clean up spills quickly and allow time for drying.  Avoid walking on wet floors.  Avoid hot utensils and knives.  Position shelves so they are not too high or low.  Place commonly used objects within easy reach.  If necessary, use a sturdy step stool with a grab bar when reaching.  Keep electrical cables out of the way.  Do not use floor polish or wax that makes floors slippery. If you must use wax, use non-skid floor wax.  Remove throw rugs and tripping hazards from the floor. STAIRWAYS  Never leave objects on stairs.  Place handrails on both sides of stairways and use them. Fix any loose handrails. Make sure handrails on both sides of the stairways are as long as the stairs.  Check carpeting to make sure it is firmly attached along stairs. Make repairs to worn or loose carpet promptly.  Avoid placing throw rugs at the top or bottom of stairways, or properly secure the rug with carpet tape to prevent slippage. Get rid of throw rugs, if possible.  Have an electrician put in a light switch at the top and bottom of the stairs. OTHER FALL PREVENTION TIPS  Wear low-heel or rubber-soled shoes that are supportive and fit well. Wear closed toe shoes.  When using a stepladder, make sure it is fully opened and both spreaders are firmly locked. Do not climb a closed stepladder.  Add color or contrast paint or tape to grab bars and handrails in your home. Place contrasting color strips on first and last steps.  Learn and use mobility aids as needed. Install an electrical emergency response system.  Turn on lights to avoid dark areas. Replace light bulbs that burn out immediately. Get light switches that glow.  Arrange furniture to create clear pathways. Keep furniture in the same place.  Firmly attach carpet with non-skid or double-sided tape.  Eliminate uneven floor surfaces.  Select a carpet pattern that does not visually hide the edge of  steps.  Be aware of all pets. OTHER HOME SAFETY TIPS  Set the water temperature for 120 F (48.8 C).  Keep emergency numbers on or near the telephone.  Keep smoke detectors on every level of the home and near sleeping areas. Document Released: 05/09/2002 Document Revised: 11/18/2011 Document Reviewed: 08/08/2011 [facility] Patient Information 2015 [place], [place]. This information is not intended to replace advice given to you by your health care provider. Make sure you discuss any questions you have with your health care provider.   Skin Tear Care A skin tear is a wound in which the top layer of skin has peeled off. This is a common problem with aging because the skin becomes thinner and more fragile as a person gets older. In addition, some medicines, such as oral corticosteroids, can lead to skin thinning if taken for long periods of time.  A skin tear is often repaired with tape or skin adhesive strips. This keeps the skin that has been peeled off in contact with the healthier skin beneath. Depending on the  location of the wound, a bandage (dressing) may be applied over the tape or skin adhesive strips. Sometimes, during the healing process, the skin turns black and dies. Even when this happens, the torn skin acts as a good dressing until the skin underneath gets healthier and repairs itself. HOME CARE INSTRUCTIONS   Change dressings once per day or as directed by your caregiver.  Gently clean the skin tear and the area around the tear using saline solution or mild soap and water.  Do not rub the injured skin dry. Let the area air dry.  Apply petroleum jelly or an antibiotic cream or ointment to keep the tear moist. This will help the wound heal. Do not allow a scab to form.  If the dressing sticks before the next dressing change, moisten it with warm soapy water and gently remove it.  Protect the injured skin until it has healed.  Only take over-the-counter or prescription  medicines as directed by your caregiver.  Take showers or baths using warm soapy water. Apply a new dressing after the shower or bath.  Keep all follow-up appointments as directed by your caregiver.  SEEK IMMEDIATE MEDICAL CARE IF:   You have redness, swelling, or increasing pain in the skin tear.  You havepus coming from the skin tear.  You have chills.  You have a red streak that goes away from the skin tear.  You have a bad smell coming from the tear or dressing.  You have a fever or persistent symptoms for more than 2-3 days.  You have a fever and your symptoms suddenly get worse. MAKE SURE YOU:  Understand these instructions.  Will watch this condition.  Will get help right away if your child is not doing well or gets worse. Document Released: 02/11/2001 Document Revised: 02/11/2012 Document Reviewed: 12/01/2011 [facility] Patient Information 2015 [place]. This information is not intended to replace advice given to you by your health care provider. Make sure you discuss any questions you have with your health care provider.

## 2014-07-08 NOTE — ED Notes (Signed)
Patient transported to X-ray 

## 2014-07-08 NOTE — ED Notes (Signed)
Patient walked well

## 2014-07-08 NOTE — ED Notes (Signed)
Family at bedside. 

## 2014-07-08 NOTE — ED Notes (Signed)
The patient came from home patient got out of bed and fell.  PTAR does not know if how she fell.  She fell and hit her right shoulder, skin tear to the right bicep and also her knee.  No deformities.  She does have some bruising to the knee.  She was here yesterday and was seen for the leg but there patient is a poor historian.  Unknown if she hit her head, or had LOC, only that she was on the floor.  According to PTAR she is a poor historian and so is her son.  Patient was following commands, and seemed appropriate.

## 2014-07-10 DIAGNOSIS — N189 Chronic kidney disease, unspecified: Secondary | ICD-10-CM | POA: Diagnosis not present

## 2014-07-10 DIAGNOSIS — M79605 Pain in left leg: Secondary | ICD-10-CM | POA: Diagnosis not present

## 2014-07-10 DIAGNOSIS — F341 Dysthymic disorder: Secondary | ICD-10-CM | POA: Diagnosis not present

## 2014-07-10 DIAGNOSIS — F0390 Unspecified dementia without behavioral disturbance: Secondary | ICD-10-CM | POA: Diagnosis not present

## 2014-07-10 DIAGNOSIS — I129 Hypertensive chronic kidney disease with stage 1 through stage 4 chronic kidney disease, or unspecified chronic kidney disease: Secondary | ICD-10-CM | POA: Diagnosis not present

## 2014-07-10 DIAGNOSIS — G8929 Other chronic pain: Secondary | ICD-10-CM | POA: Diagnosis not present

## 2014-07-10 DIAGNOSIS — M1990 Unspecified osteoarthritis, unspecified site: Secondary | ICD-10-CM | POA: Diagnosis not present

## 2014-07-12 ENCOUNTER — Telehealth: Payer: Self-pay | Admitting: Internal Medicine

## 2014-07-12 NOTE — Telephone Encounter (Signed)
Please approve

## 2014-07-12 NOTE — Telephone Encounter (Signed)
Requesting verbal orders for PT for two times a week for five weeks and an order for skilled nursing evaluation for med teaching.

## 2014-07-12 NOTE — Telephone Encounter (Signed)
Called and left message on voice mail approving home health.

## 2014-07-12 NOTE — Telephone Encounter (Signed)
Please advise, thanks.

## 2014-07-14 DIAGNOSIS — M79605 Pain in left leg: Secondary | ICD-10-CM | POA: Diagnosis not present

## 2014-07-14 DIAGNOSIS — G8929 Other chronic pain: Secondary | ICD-10-CM | POA: Diagnosis not present

## 2014-07-14 DIAGNOSIS — M1990 Unspecified osteoarthritis, unspecified site: Secondary | ICD-10-CM | POA: Diagnosis not present

## 2014-07-14 DIAGNOSIS — F341 Dysthymic disorder: Secondary | ICD-10-CM | POA: Diagnosis not present

## 2014-07-14 DIAGNOSIS — N189 Chronic kidney disease, unspecified: Secondary | ICD-10-CM | POA: Diagnosis not present

## 2014-07-14 DIAGNOSIS — F0390 Unspecified dementia without behavioral disturbance: Secondary | ICD-10-CM | POA: Diagnosis not present

## 2014-07-14 DIAGNOSIS — I129 Hypertensive chronic kidney disease with stage 1 through stage 4 chronic kidney disease, or unspecified chronic kidney disease: Secondary | ICD-10-CM | POA: Diagnosis not present

## 2014-07-18 DIAGNOSIS — M79605 Pain in left leg: Secondary | ICD-10-CM | POA: Diagnosis not present

## 2014-07-18 DIAGNOSIS — M1990 Unspecified osteoarthritis, unspecified site: Secondary | ICD-10-CM | POA: Diagnosis not present

## 2014-07-18 DIAGNOSIS — F0390 Unspecified dementia without behavioral disturbance: Secondary | ICD-10-CM | POA: Diagnosis not present

## 2014-07-18 DIAGNOSIS — F341 Dysthymic disorder: Secondary | ICD-10-CM | POA: Diagnosis not present

## 2014-07-18 DIAGNOSIS — N189 Chronic kidney disease, unspecified: Secondary | ICD-10-CM | POA: Diagnosis not present

## 2014-07-18 DIAGNOSIS — G8929 Other chronic pain: Secondary | ICD-10-CM | POA: Diagnosis not present

## 2014-07-18 DIAGNOSIS — I129 Hypertensive chronic kidney disease with stage 1 through stage 4 chronic kidney disease, or unspecified chronic kidney disease: Secondary | ICD-10-CM | POA: Diagnosis not present

## 2014-07-20 DIAGNOSIS — I129 Hypertensive chronic kidney disease with stage 1 through stage 4 chronic kidney disease, or unspecified chronic kidney disease: Secondary | ICD-10-CM | POA: Diagnosis not present

## 2014-07-20 DIAGNOSIS — M79605 Pain in left leg: Secondary | ICD-10-CM | POA: Diagnosis not present

## 2014-07-20 DIAGNOSIS — N189 Chronic kidney disease, unspecified: Secondary | ICD-10-CM | POA: Diagnosis not present

## 2014-07-20 DIAGNOSIS — M1990 Unspecified osteoarthritis, unspecified site: Secondary | ICD-10-CM | POA: Diagnosis not present

## 2014-07-20 DIAGNOSIS — F341 Dysthymic disorder: Secondary | ICD-10-CM | POA: Diagnosis not present

## 2014-07-20 DIAGNOSIS — G8929 Other chronic pain: Secondary | ICD-10-CM | POA: Diagnosis not present

## 2014-07-20 DIAGNOSIS — F0390 Unspecified dementia without behavioral disturbance: Secondary | ICD-10-CM | POA: Diagnosis not present

## 2014-07-25 DIAGNOSIS — F341 Dysthymic disorder: Secondary | ICD-10-CM | POA: Diagnosis not present

## 2014-07-25 DIAGNOSIS — F0390 Unspecified dementia without behavioral disturbance: Secondary | ICD-10-CM | POA: Diagnosis not present

## 2014-07-25 DIAGNOSIS — M79605 Pain in left leg: Secondary | ICD-10-CM | POA: Diagnosis not present

## 2014-07-25 DIAGNOSIS — M1990 Unspecified osteoarthritis, unspecified site: Secondary | ICD-10-CM | POA: Diagnosis not present

## 2014-07-25 DIAGNOSIS — G8929 Other chronic pain: Secondary | ICD-10-CM | POA: Diagnosis not present

## 2014-07-25 DIAGNOSIS — I129 Hypertensive chronic kidney disease with stage 1 through stage 4 chronic kidney disease, or unspecified chronic kidney disease: Secondary | ICD-10-CM | POA: Diagnosis not present

## 2014-07-25 DIAGNOSIS — N189 Chronic kidney disease, unspecified: Secondary | ICD-10-CM | POA: Diagnosis not present

## 2014-07-27 ENCOUNTER — Telehealth: Payer: Self-pay | Admitting: *Deleted

## 2014-07-27 DIAGNOSIS — G8929 Other chronic pain: Secondary | ICD-10-CM | POA: Diagnosis not present

## 2014-07-27 DIAGNOSIS — M79605 Pain in left leg: Secondary | ICD-10-CM | POA: Diagnosis not present

## 2014-07-27 DIAGNOSIS — I129 Hypertensive chronic kidney disease with stage 1 through stage 4 chronic kidney disease, or unspecified chronic kidney disease: Secondary | ICD-10-CM | POA: Diagnosis not present

## 2014-07-27 DIAGNOSIS — F0390 Unspecified dementia without behavioral disturbance: Secondary | ICD-10-CM | POA: Diagnosis not present

## 2014-07-27 DIAGNOSIS — N189 Chronic kidney disease, unspecified: Secondary | ICD-10-CM | POA: Diagnosis not present

## 2014-07-27 DIAGNOSIS — M1990 Unspecified osteoarthritis, unspecified site: Secondary | ICD-10-CM | POA: Diagnosis not present

## 2014-07-27 DIAGNOSIS — F341 Dysthymic disorder: Secondary | ICD-10-CM | POA: Diagnosis not present

## 2014-07-27 NOTE — Telephone Encounter (Signed)
Received home health certification and plan of care via fax from [facility]. Forwarded to Dr. Alwyn RenHopper by interoffice mail ([facility] [email or web address]//[email or web address]

## 2014-07-31 DIAGNOSIS — M79605 Pain in left leg: Secondary | ICD-10-CM | POA: Diagnosis not present

## 2014-07-31 DIAGNOSIS — F341 Dysthymic disorder: Secondary | ICD-10-CM | POA: Diagnosis not present

## 2014-07-31 DIAGNOSIS — I129 Hypertensive chronic kidney disease with stage 1 through stage 4 chronic kidney disease, or unspecified chronic kidney disease: Secondary | ICD-10-CM | POA: Diagnosis not present

## 2014-07-31 DIAGNOSIS — G8929 Other chronic pain: Secondary | ICD-10-CM | POA: Diagnosis not present

## 2014-07-31 DIAGNOSIS — N189 Chronic kidney disease, unspecified: Secondary | ICD-10-CM | POA: Diagnosis not present

## 2014-07-31 DIAGNOSIS — F0390 Unspecified dementia without behavioral disturbance: Secondary | ICD-10-CM | POA: Diagnosis not present

## 2014-07-31 DIAGNOSIS — M1990 Unspecified osteoarthritis, unspecified site: Secondary | ICD-10-CM | POA: Diagnosis not present

## 2014-08-01 DIAGNOSIS — F0390 Unspecified dementia without behavioral disturbance: Secondary | ICD-10-CM | POA: Diagnosis not present

## 2014-08-01 DIAGNOSIS — N189 Chronic kidney disease, unspecified: Secondary | ICD-10-CM | POA: Diagnosis not present

## 2014-08-01 DIAGNOSIS — M1990 Unspecified osteoarthritis, unspecified site: Secondary | ICD-10-CM | POA: Diagnosis not present

## 2014-08-01 DIAGNOSIS — I129 Hypertensive chronic kidney disease with stage 1 through stage 4 chronic kidney disease, or unspecified chronic kidney disease: Secondary | ICD-10-CM | POA: Diagnosis not present

## 2014-08-01 DIAGNOSIS — M79605 Pain in left leg: Secondary | ICD-10-CM | POA: Diagnosis not present

## 2014-08-01 DIAGNOSIS — F341 Dysthymic disorder: Secondary | ICD-10-CM | POA: Diagnosis not present

## 2014-08-01 DIAGNOSIS — G8929 Other chronic pain: Secondary | ICD-10-CM | POA: Diagnosis not present

## 2014-08-02 DIAGNOSIS — G8929 Other chronic pain: Secondary | ICD-10-CM | POA: Diagnosis not present

## 2014-08-02 DIAGNOSIS — I129 Hypertensive chronic kidney disease with stage 1 through stage 4 chronic kidney disease, or unspecified chronic kidney disease: Secondary | ICD-10-CM | POA: Diagnosis not present

## 2014-08-02 DIAGNOSIS — F0390 Unspecified dementia without behavioral disturbance: Secondary | ICD-10-CM | POA: Diagnosis not present

## 2014-08-02 DIAGNOSIS — F341 Dysthymic disorder: Secondary | ICD-10-CM | POA: Diagnosis not present

## 2014-08-02 DIAGNOSIS — N189 Chronic kidney disease, unspecified: Secondary | ICD-10-CM | POA: Diagnosis not present

## 2014-08-02 DIAGNOSIS — M79605 Pain in left leg: Secondary | ICD-10-CM | POA: Diagnosis not present

## 2014-08-02 DIAGNOSIS — M1990 Unspecified osteoarthritis, unspecified site: Secondary | ICD-10-CM | POA: Diagnosis not present

## 2014-08-03 DIAGNOSIS — I129 Hypertensive chronic kidney disease with stage 1 through stage 4 chronic kidney disease, or unspecified chronic kidney disease: Secondary | ICD-10-CM | POA: Diagnosis not present

## 2014-08-03 DIAGNOSIS — M1990 Unspecified osteoarthritis, unspecified site: Secondary | ICD-10-CM | POA: Diagnosis not present

## 2014-08-03 DIAGNOSIS — N189 Chronic kidney disease, unspecified: Secondary | ICD-10-CM | POA: Diagnosis not present

## 2014-08-03 DIAGNOSIS — F341 Dysthymic disorder: Secondary | ICD-10-CM | POA: Diagnosis not present

## 2014-08-03 DIAGNOSIS — F0390 Unspecified dementia without behavioral disturbance: Secondary | ICD-10-CM | POA: Diagnosis not present

## 2014-08-03 DIAGNOSIS — G8929 Other chronic pain: Secondary | ICD-10-CM | POA: Diagnosis not present

## 2014-08-03 DIAGNOSIS — M79605 Pain in left leg: Secondary | ICD-10-CM | POA: Diagnosis not present

## 2014-08-07 DIAGNOSIS — N189 Chronic kidney disease, unspecified: Secondary | ICD-10-CM | POA: Diagnosis not present

## 2014-08-07 DIAGNOSIS — M1990 Unspecified osteoarthritis, unspecified site: Secondary | ICD-10-CM | POA: Diagnosis not present

## 2014-08-07 DIAGNOSIS — F341 Dysthymic disorder: Secondary | ICD-10-CM | POA: Diagnosis not present

## 2014-08-07 DIAGNOSIS — I129 Hypertensive chronic kidney disease with stage 1 through stage 4 chronic kidney disease, or unspecified chronic kidney disease: Secondary | ICD-10-CM | POA: Diagnosis not present

## 2014-08-07 DIAGNOSIS — M79605 Pain in left leg: Secondary | ICD-10-CM | POA: Diagnosis not present

## 2014-08-07 DIAGNOSIS — F0390 Unspecified dementia without behavioral disturbance: Secondary | ICD-10-CM | POA: Diagnosis not present

## 2014-08-07 DIAGNOSIS — G8929 Other chronic pain: Secondary | ICD-10-CM | POA: Diagnosis not present

## 2014-08-09 DIAGNOSIS — F341 Dysthymic disorder: Secondary | ICD-10-CM | POA: Diagnosis not present

## 2014-08-09 DIAGNOSIS — N189 Chronic kidney disease, unspecified: Secondary | ICD-10-CM | POA: Diagnosis not present

## 2014-08-09 DIAGNOSIS — F0390 Unspecified dementia without behavioral disturbance: Secondary | ICD-10-CM | POA: Diagnosis not present

## 2014-08-09 DIAGNOSIS — M1990 Unspecified osteoarthritis, unspecified site: Secondary | ICD-10-CM | POA: Diagnosis not present

## 2014-08-09 DIAGNOSIS — I129 Hypertensive chronic kidney disease with stage 1 through stage 4 chronic kidney disease, or unspecified chronic kidney disease: Secondary | ICD-10-CM | POA: Diagnosis not present

## 2014-08-09 DIAGNOSIS — G8929 Other chronic pain: Secondary | ICD-10-CM | POA: Diagnosis not present

## 2014-08-09 DIAGNOSIS — M79605 Pain in left leg: Secondary | ICD-10-CM | POA: Diagnosis not present

## 2014-08-10 ENCOUNTER — Telehealth: Payer: Self-pay | Admitting: *Deleted

## 2014-08-11 DIAGNOSIS — F0390 Unspecified dementia without behavioral disturbance: Secondary | ICD-10-CM | POA: Diagnosis not present

## 2014-08-11 DIAGNOSIS — M1990 Unspecified osteoarthritis, unspecified site: Secondary | ICD-10-CM | POA: Diagnosis not present

## 2014-08-11 DIAGNOSIS — G8929 Other chronic pain: Secondary | ICD-10-CM | POA: Diagnosis not present

## 2014-08-11 DIAGNOSIS — I129 Hypertensive chronic kidney disease with stage 1 through stage 4 chronic kidney disease, or unspecified chronic kidney disease: Secondary | ICD-10-CM | POA: Diagnosis not present

## 2014-08-11 DIAGNOSIS — N189 Chronic kidney disease, unspecified: Secondary | ICD-10-CM | POA: Diagnosis not present

## 2014-08-11 DIAGNOSIS — M79605 Pain in left leg: Secondary | ICD-10-CM | POA: Diagnosis not present

## 2014-08-11 DIAGNOSIS — F341 Dysthymic disorder: Secondary | ICD-10-CM | POA: Diagnosis not present

## 2014-08-14 DIAGNOSIS — M79605 Pain in left leg: Secondary | ICD-10-CM | POA: Diagnosis not present

## 2014-08-14 DIAGNOSIS — F0390 Unspecified dementia without behavioral disturbance: Secondary | ICD-10-CM | POA: Diagnosis not present

## 2014-08-14 DIAGNOSIS — F341 Dysthymic disorder: Secondary | ICD-10-CM | POA: Diagnosis not present

## 2014-08-14 DIAGNOSIS — M1990 Unspecified osteoarthritis, unspecified site: Secondary | ICD-10-CM | POA: Diagnosis not present

## 2014-08-14 DIAGNOSIS — I129 Hypertensive chronic kidney disease with stage 1 through stage 4 chronic kidney disease, or unspecified chronic kidney disease: Secondary | ICD-10-CM | POA: Diagnosis not present

## 2014-08-14 DIAGNOSIS — G8929 Other chronic pain: Secondary | ICD-10-CM | POA: Diagnosis not present

## 2014-08-14 DIAGNOSIS — N189 Chronic kidney disease, unspecified: Secondary | ICD-10-CM | POA: Diagnosis not present

## 2014-08-15 NOTE — Telephone Encounter (Signed)
Opened in error

## 2014-08-21 DIAGNOSIS — N189 Chronic kidney disease, unspecified: Secondary | ICD-10-CM | POA: Diagnosis not present

## 2014-08-21 DIAGNOSIS — F341 Dysthymic disorder: Secondary | ICD-10-CM | POA: Diagnosis not present

## 2014-08-21 DIAGNOSIS — I129 Hypertensive chronic kidney disease with stage 1 through stage 4 chronic kidney disease, or unspecified chronic kidney disease: Secondary | ICD-10-CM | POA: Diagnosis not present

## 2014-08-21 DIAGNOSIS — G8929 Other chronic pain: Secondary | ICD-10-CM | POA: Diagnosis not present

## 2014-08-21 DIAGNOSIS — M79605 Pain in left leg: Secondary | ICD-10-CM | POA: Diagnosis not present

## 2014-08-21 DIAGNOSIS — F0390 Unspecified dementia without behavioral disturbance: Secondary | ICD-10-CM | POA: Diagnosis not present

## 2014-08-21 DIAGNOSIS — M1990 Unspecified osteoarthritis, unspecified site: Secondary | ICD-10-CM | POA: Diagnosis not present

## 2014-08-25 DIAGNOSIS — M1990 Unspecified osteoarthritis, unspecified site: Secondary | ICD-10-CM | POA: Diagnosis not present

## 2014-08-25 DIAGNOSIS — I129 Hypertensive chronic kidney disease with stage 1 through stage 4 chronic kidney disease, or unspecified chronic kidney disease: Secondary | ICD-10-CM | POA: Diagnosis not present

## 2014-08-25 DIAGNOSIS — F0390 Unspecified dementia without behavioral disturbance: Secondary | ICD-10-CM | POA: Diagnosis not present

## 2014-08-25 DIAGNOSIS — F341 Dysthymic disorder: Secondary | ICD-10-CM | POA: Diagnosis not present

## 2014-08-25 DIAGNOSIS — G8929 Other chronic pain: Secondary | ICD-10-CM | POA: Diagnosis not present

## 2014-08-25 DIAGNOSIS — N189 Chronic kidney disease, unspecified: Secondary | ICD-10-CM | POA: Diagnosis not present

## 2014-08-25 DIAGNOSIS — M79605 Pain in left leg: Secondary | ICD-10-CM | POA: Diagnosis not present

## 2014-08-30 DIAGNOSIS — M79605 Pain in left leg: Secondary | ICD-10-CM | POA: Diagnosis not present

## 2014-09-07 ENCOUNTER — Encounter: Payer: Self-pay | Admitting: Internal Medicine

## 2014-09-07 ENCOUNTER — Ambulatory Visit (INDEPENDENT_AMBULATORY_CARE_PROVIDER_SITE_OTHER): Payer: Commercial Managed Care - HMO | Admitting: Internal Medicine

## 2014-09-07 VITALS — BP 118/70 | HR 66 | Temp 98.0°F | Resp 14 | Ht 65.0 in | Wt 180.8 lb

## 2014-09-07 DIAGNOSIS — R2232 Localized swelling, mass and lump, left upper limb: Secondary | ICD-10-CM | POA: Insufficient documentation

## 2014-09-07 DIAGNOSIS — F0390 Unspecified dementia without behavioral disturbance: Secondary | ICD-10-CM | POA: Diagnosis not present

## 2014-09-07 DIAGNOSIS — Z23 Encounter for immunization: Secondary | ICD-10-CM

## 2014-09-07 DIAGNOSIS — Z418 Encounter for other procedures for purposes other than remedying health state: Secondary | ICD-10-CM

## 2014-09-07 DIAGNOSIS — Z299 Encounter for prophylactic measures, unspecified: Secondary | ICD-10-CM

## 2014-09-07 NOTE — Patient Instructions (Signed)
Please call us back if the spot on your arm is getting bigger or not getting better in the next 1-2 months. We can always send you to the dermatologist or surgeon to take a biopsy of it.   Come back in about 3-6 months or sooner if you are having problems.   Memory Compensation Strategies  1. Use "WARM" strategy.  W= write it down  A= associate it  R= repeat it  M= make a mental note  2.   You can keep a Glass blower/designerMemory Notebook.  Use a 3-ring notebook with sections for the following: calendar, important names and phone numbers,  medications, doctors' names/phone numbers, lists/reminders, and a section to journal what you did  each day.   3.    Use a calendar to write appointments down.  4.    Write yourself a schedule for the day.  This can be placed on the calendar or in a separate section of the Memory Notebook.  Keeping a  regular schedule can help memory.  5.    Use medication organizer with sections for each day or morning/evening pills.  You may need help loading it  6.    Keep a basket, or pegboard by the door.  Place items that you need to take out with you in the basket or on the pegboard.  You may also want to  include a message board for reminders.  7.    Use sticky notes.  Place sticky notes with reminders in a place where the task is performed.  For example: " turn off the  stove" placed by the stove, "lock the door" placed on the door at eye level, " take your medications" on  the bathroom mirror or by the place where you normally take your medications.  8.    Use alarms/timers.  Use while cooking to remind yourself to check on food or as a reminder to take your medicine, or as a  reminder to make a call, or as a reminder to perform another task, etc.

## 2014-09-07 NOTE — Progress Notes (Signed)
   Subjective:    Patient ID: [name], female    DOB: 08/25/1920, 81 y.o.   MRN: [id number]  HPI The patient is a 81 YO woman who is coming in today to follow up on her dementia. She tried aricept but does not like how it makes her feel. She still lives alone and has help. She hid the pills in an effort to not take them. She denies new memory problems but her family states that her memory is as poor as ever and may be mildly declining. She is still slightly irritated that she cannot drive anymore but she does have reliable transportation in her family who is willing to take her where she needs to go. She also has a knot on her left arm that popped up in the last 2-3 weeks. It has not grown. Does not itch. She has not tried cream on it. No pain. No weight loss or fevers. No scratches or bites in that area. No IV lines in that area recently.   Review of Systems  Constitutional: Negative for fever, activity change, appetite change, fatigue and unexpected weight change.  HENT: Negative.   Respiratory: Negative for cough, chest tightness, shortness of breath and wheezing.   Cardiovascular: Negative for chest pain, palpitations and leg swelling.  Gastrointestinal: Negative for abdominal pain, diarrhea, constipation and abdominal distention.  Musculoskeletal: Positive for arthralgias. Negative for myalgias.       Occasional  Skin:       Knot on left upper arm  Neurological: Negative for dizziness, weakness and headaches.       Memory problems      Objective:   Physical Exam  Constitutional: She appears well-developed and well-nourished.  HENT:  Head: Normocephalic and atraumatic.  Eyes: EOM are normal.  Neck: Normal range of motion.  Cardiovascular: Normal rate and regular rhythm.   Pulmonary/Chest: Effort normal and breath sounds normal. No respiratory distress. She has no wheezes. She has no rales.  Abdominal: Soft. Bowel sounds are normal. She exhibits no distension. There is no  tenderness. There is no rebound.  Neurological: She is alert. Coordination normal.  Skin: Skin is warm and dry.  Knot of hard lump about 1 cm on the left inner arm, no pain or rash on the skin, no adenopathy in the area.    Filed Vitals:   09/07/14 0911  BP: 118/70  Pulse: 66  Temp: 98 F (36.7 C)  TempSrc: Oral  Resp: 14  Height: 5\' 5"  (1.651 m)  Weight: 180 lb 12.8 oz (82.01 kg)  SpO2: 94%      Assessment & Plan:

## 2014-09-07 NOTE — Progress Notes (Signed)
Pre visit review using our clinic review tool, if applicable. No additional management support is needed unless otherwise documented below in the visit note. 

## 2014-09-07 NOTE — Assessment & Plan Note (Signed)
At this time would not pursue aggressive intervention. Will have them try heat and observe until next visit. If no better then can refer to surgeon for biopsy of the area. Possibility that it is a lymph or venous cord and may resolve.

## 2014-09-07 NOTE — Assessment & Plan Note (Addendum)
Does not want to take aricept and will continue to provide expectant management. No behavioral problems at this time and still able to live independently. She does have a strong support network to help her. Talked to them about exelon patch but they are not ready to try something else right now.

## 2014-09-13 ENCOUNTER — Other Ambulatory Visit: Payer: Self-pay | Admitting: *Deleted

## 2014-09-13 NOTE — Patient Outreach (Signed)
Called member's son, Valera CastleCharles Blake, to follow up on member's status and to schedule a routine home visit for this month, with the possibility of a case closure.  Spoke with Reginal LutesJ. Blake, the member's daughter in law, and she states that the member has been doing fine.  She continues to report the member does eat more when she is out with them during the day instead of when she is home alone.  Unable to obtain a home visit this week due to the member's being in [place] all week and having to prepare.  Home visit scheduled for next week.  Kemper DurieMonica Delmas Faucett, BSN, [facility] Care Management  [facility] Care Manager [phone]

## 2014-09-19 ENCOUNTER — Other Ambulatory Visit: Payer: Self-pay | Admitting: *Deleted

## 2014-09-19 ENCOUNTER — Encounter: Payer: Self-pay | Admitting: *Deleted

## 2014-09-19 NOTE — Patient Outreach (Signed)
[facility]) Care Management   09/19/2014  [name] September 27, 1920 [id number]  [name] is an 81 y.o. female  Subjective:   Member states that she is "feeling pretty good, except for this here leg."  Complains of chronic pain in her left leg, but has not taken any Tylenol do relieve the pain.  Encouraged to take pain relief medication.  Objective:   Review of Systems  Musculoskeletal: Positive for joint pain.       Left foot/leg  Psychiatric/Behavioral: Positive for memory loss.       Related to Alzheimer's    Physical Exam  Constitutional: She appears well-developed and well-nourished.  Neck: Normal range of motion.  Cardiovascular: Normal rate, regular rhythm and normal heart sounds.   Respiratory: Effort normal and breath sounds normal.  GI: Soft. Bowel sounds are normal.  Musculoskeletal: Normal range of motion.  Neurological: She is alert.  Skin: Skin is warm and dry.   BP 118/72 mmHg  Pulse 58  Resp 16  Ht 1.651 m (5' 5" )  Wt 180 lb (81.647 kg)  BMI 29.95 kg/m2  SpO2 95%  Current Medications:   Current Outpatient Prescriptions  Medication Sig Dispense Refill  . acetaminophen (TYLENOL) 325 MG tablet Take 650 mg by mouth every 6 (six) hours as needed for moderate pain.    Marland Kitchen albuterol (PROVENTIL HFA;VENTOLIN HFA) 108 (90 BASE) MCG/ACT inhaler Inhale 2 puffs into the lungs every 6 (six) hours as needed for wheezing or shortness of breath. 1 Inhaler 0  . aspirin 81 MG tablet Take 81 mg by mouth daily.    Marland Kitchen ketoconazole (NIZORAL) 2 % cream Apply 1 application topically daily. 60 g 2  . levothyroxine (SYNTHROID, LEVOTHROID) 50 MCG tablet TAKE 1 TABLET (50 MCG TOTAL) BY MOUTH DAILY. 30 tablet 3  . LORazepam (ATIVAN) 1 MG tablet Take 0.5 mg by mouth every 8 (eight) hours as needed for anxiety or sleep.     Marland Kitchen losartan (COZAAR) 50 MG tablet TAKE 0.5 TABLETS (25 MG TOTAL) BY MOUTH DAILY. 30 tablet 0  . Magnesium 250 MG TABS Take 250 mg by mouth daily.      Marland Kitchen donepezil (ARICEPT) 5 MG tablet Take 1 tablet (5 mg total) by mouth at bedtime. (Patient not taking: Reported on 09/19/2014) 30 tablet 3   No current facility-administered medications for this visit.    Functional Status:   In your present state of health, do you have any difficulty performing the following activities: 09/19/2014 10/02/2013  Hearing? Y N  Vision? Y N  Difficulty concentrating or making decisions? Y N  Walking or climbing stairs? N Y  Dressing or bathing? - N  Doing errands, shopping? Tempie Donning  Preparing Food and eating ? Y -  Using the Toilet? N -  In the past six months, have you accidently leaked urine? Y -  Do you have problems with loss of bowel control? N -  Managing your Medications? Y -  Managing your Finances? Y -  Housekeeping or managing your Housekeeping? N -    Fall/Depression Screening:    PHQ 2/9 Scores 07/06/2014  PHQ - 2 Score 3  PHQ- 9 Score 11    Assessment:    Met with member at scheduled time.  Son and daughter in law both present during visit.  Member states that she has been doing well since last home visit.  Son states that the member continues to show a loss in her memory, and states  that he and his wife continue to have to repeat things over again.  The progression of Alzheimer's discussed, stating that consistently repeating things would be a part of the process.  Son and daughter in law both verbalize understanding.  Daughter in law states that the member has decided not to take her Aricept any longer, and that there are some days where she chooses not to take any of her medications, despite being reminded.  Both the son and daughter in law state that they have made the decision to allow her to choose when she wants to take her medications and when she does not, stating that the member becomes agitated if they speak too often to her about taking them.  Member states that she still has not increased her meal intake to 3 times a day.  She states  that she barely eats twice a day, but feels that she eats enough.  She states that she does eat when she is hungry.  She also states that she has not increased her fluid intake.  She is unsure of how much fluid she consumes on a daily basis, but does not think it is much.  She states that she does not drink more because she would then have to go to the bathroom more.  She also states that she feels she drinks enough.  Her son states that when she is out with them, she does eat more, stating that she has often said that she does not like to eat alone.  She states that she will not be able to eat or drink more, again stating "I think I do enough."  Activities and resource centers for the elderly discussed.  Son states that he has looked into the [facility] program, but states that the member does not want to change her primary physician.  Daughter in law has obtained more information on local resources from an educational session provided at the apartment building and states that she will look into other options.  Member, son, and daughter in law all made aware that all current goals for the member's program have either been met or the member has chosen not to meet them.  Informed that at this time, this care manager will close the case.  They are aware that if anything changes and they are in need of further resources that they can call the main office to request the case to be reopened.  They are also aware of the 24 hour nurse line that remains available to them.  They all deny any further questions or concerns at this time.  Plan:   Will send case closure letter to member and physician.  [place]        Patient Outreach from 09/19/2014 in [place] Problem One  New diagnosis of [place] for Problem One  Active   [facility] Long Term Goal (31-90 days)  member will report taking medications as prescribed within the next 31 days   [facility] Long Term Goal Start Date  07/13/14    [facility] Long Term Goal Met Date  08/04/14   [facility] CM Short Term Goal #1 (0-30 days)  member will report an increase nutrition/meals to 3 times a day for the next 4 weeks   [facility] CM Short Term Goal #1 Start Date  07/13/14   [facility] CM Short Term Goal #1 Met Date  -- [Goal not met. Continues to eat only 2  meals/ day]   [facility] CM Short Term Goal #2 (0-30 days)  Member will not have significant weight loss within the next 4 weeks   [facility] CM Short Term Goal #2 Start Date  07/13/14   [facility] CM Short Term Goal #2 Met Date  08/04/14   Care Plan Problem Two  Risk for dehydration related to decreased oral intake   Care Plan for Problem Two  Active   [facility] Long Term Goal (31-90) days  Member will not have fall related to dizziness or dehydration within the next 31-45 days   [facility] Long Term Goal Start Date  08/04/14   [facility] Long Term Goal Met Date  09/19/14   [facility] CM Short Term Goal #1 (0-30 days)  Member will report drinking at least 32 ounces of fluid per day within the next 3-4 weeks   [facility] CM Short Term Goal #1 Start Date  08/04/14   [facility] CM Short Term Goal #1 Met Date   -- [Goal not met, Member refuses to drink more fluids]   [facility] CM Short Term Goal #2 (0-30 days)  Member will report weighing self at least once a week for the next 3-4 weeks   [facility] CM Short Term Goal #2 Start Date  08/04/14   [facility] CM Short Term Goal #2 Met Date  09/19/14      Valente David, BSN, [place] Manager [phone]

## 2014-09-22 ENCOUNTER — Encounter: Payer: Self-pay | Admitting: *Deleted

## 2014-09-23 ENCOUNTER — Other Ambulatory Visit: Payer: Self-pay | Admitting: Family Medicine

## 2014-09-25 ENCOUNTER — Other Ambulatory Visit: Payer: Self-pay | Admitting: Internal Medicine

## 2014-10-03 NOTE — Patient Outreach (Signed)
[facility]) Care Management  10/03/2014  Jeannette CorpusStella P Norfolk 02/14/1921 [id number]   Received notification from [facility], RN to close case due to goals mets.  Corrie MckusickLisa O. [facility] Care Management [facility] CM Assistant Phone: [phone] Fax: [phone]

## 2014-10-22 IMAGING — CT CT HEAD W/O CM
1 series · 16 of 30 positions shown, 20 images · non-contrast
Comparison: MRI brain 12/18/2012.

CLINICAL DATA: Generalized weakness and decreased appetite.
Headache and dizziness.

EXAM:
CT HEAD WITHOUT CONTRAST
TECHNIQUE: Contiguous axial images were obtained from the base of the skull
through the vertex without intravenous contrast.

[Series 2: head 5.0 h30s · axial · 0.44mm/px · z∈[+1070,+1210]mm · 16 of 32 slices shown, 20 images]
[im 2/32  brain]
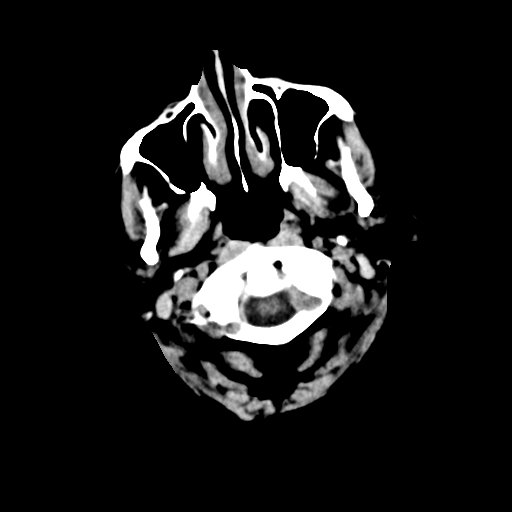
[im 2/32  bone]
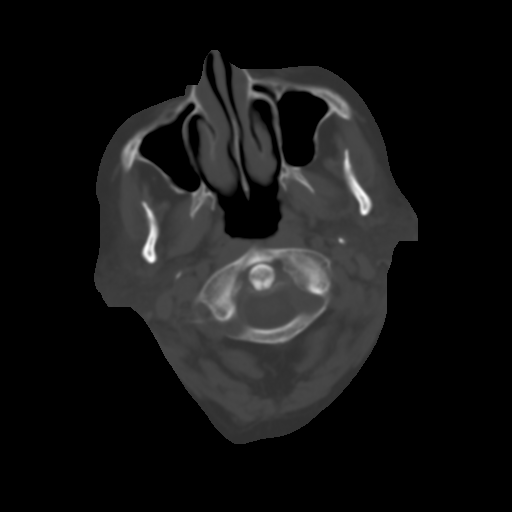
[im 4/32  brain]
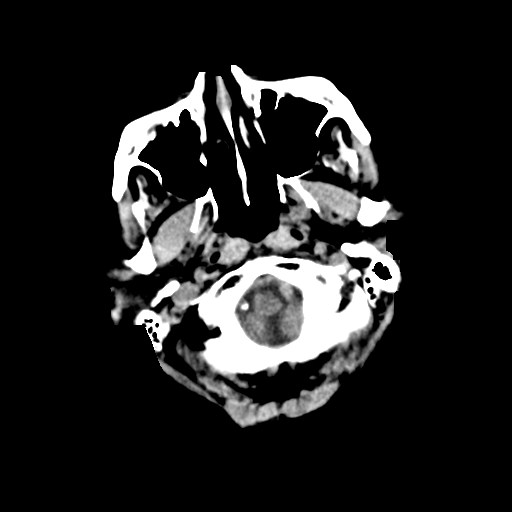
[im 6/32  brain]
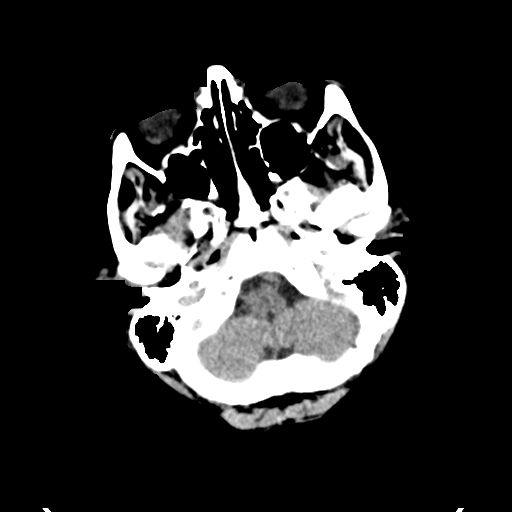
[im 8/32  brain]
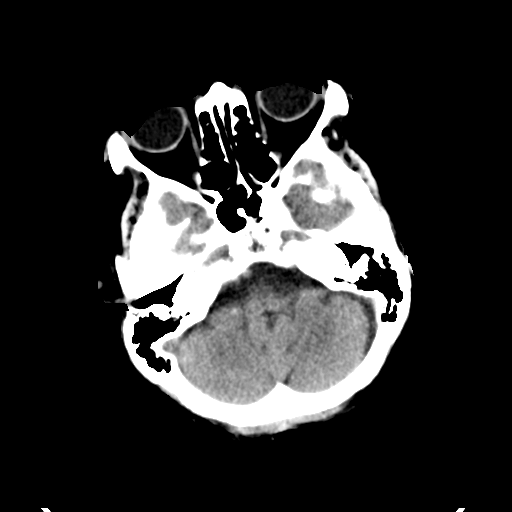
[im 9/32  brain]
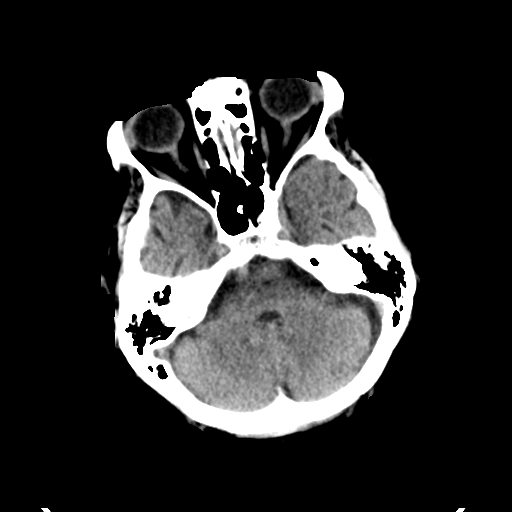
[im 9/32  bone]
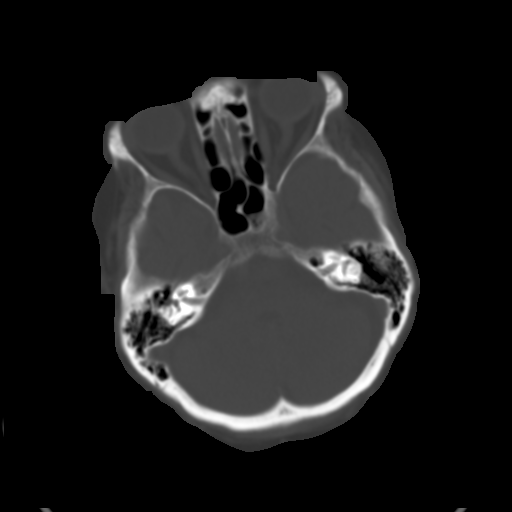
[im 11/32  brain]
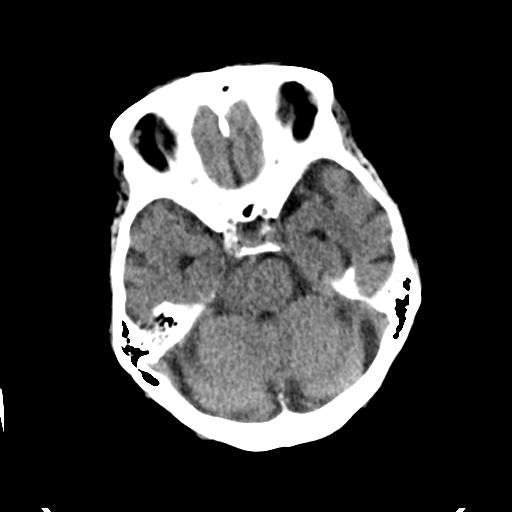
[im 13/32  brain]
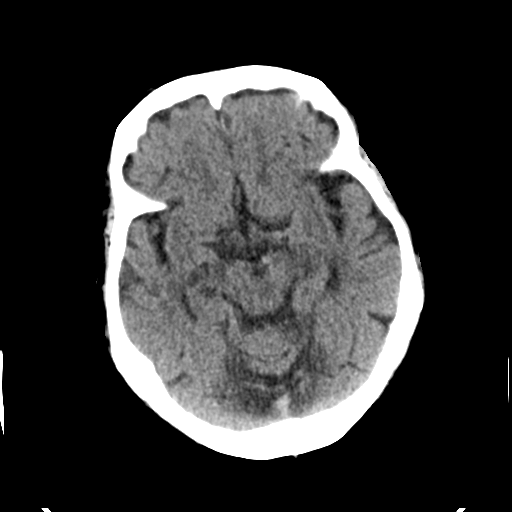
[im 15/32  brain]
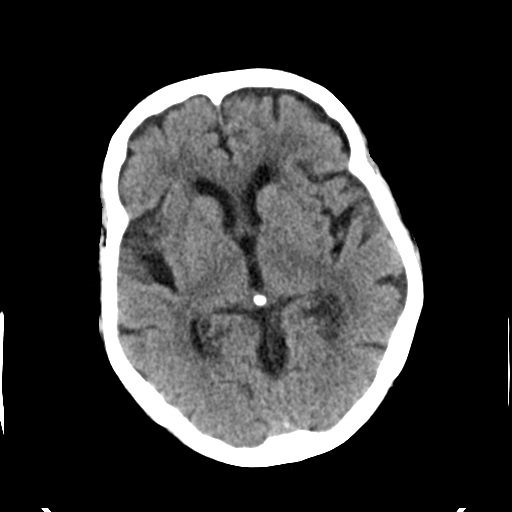
[im 17/32  brain]
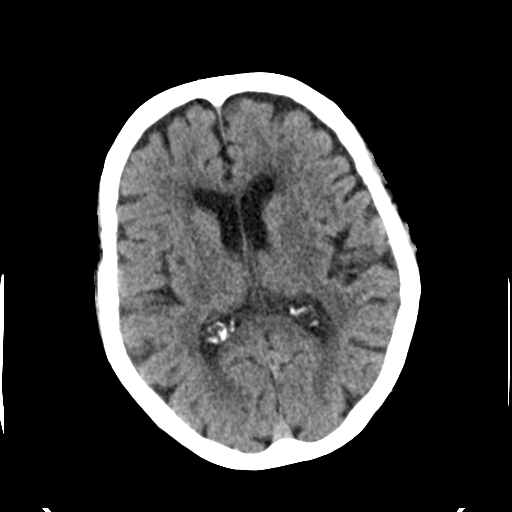
[im 17/32  bone]
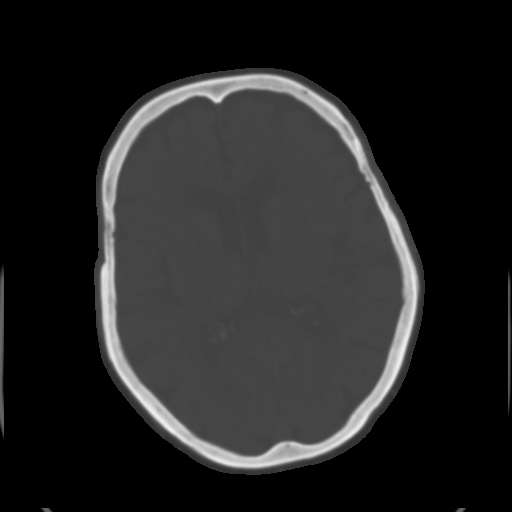
[im 19/32  brain]
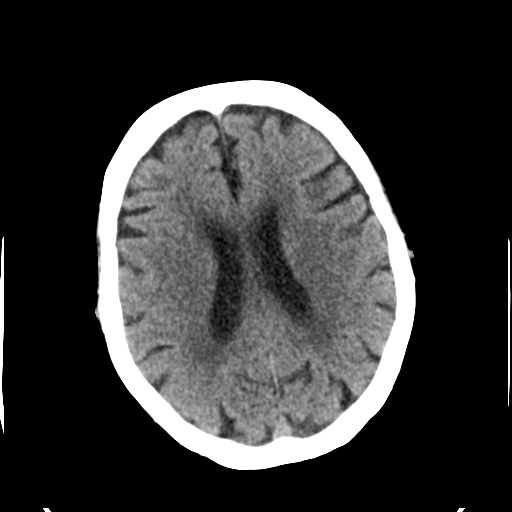
[im 21/32  brain]
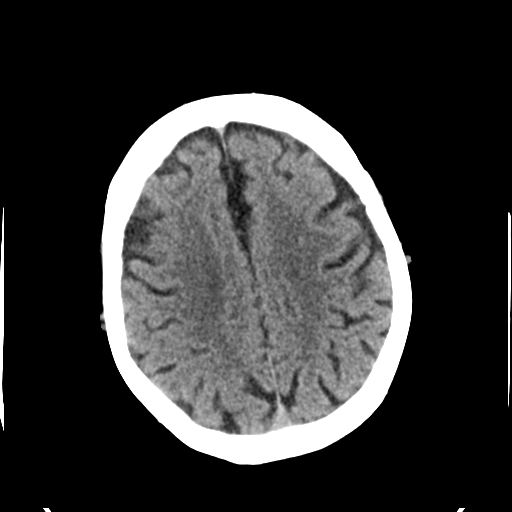
[im 23/32  brain]
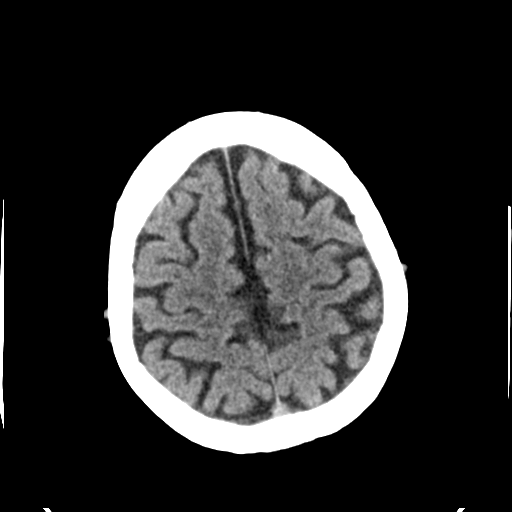
[im 24/32  brain]
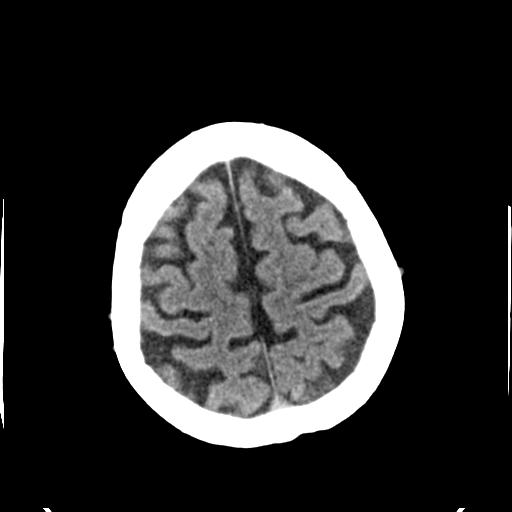
[im 24/32  bone]
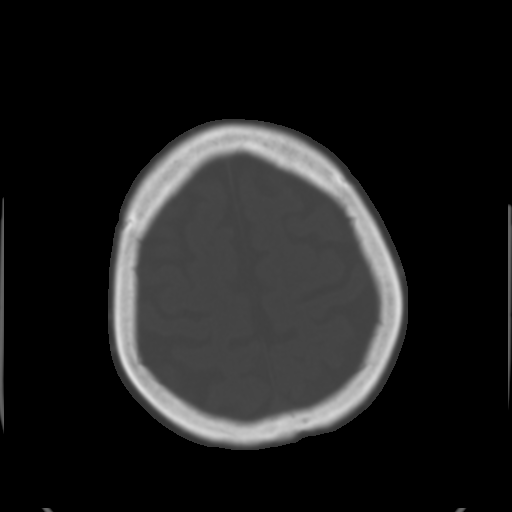
[im 26/32  brain]
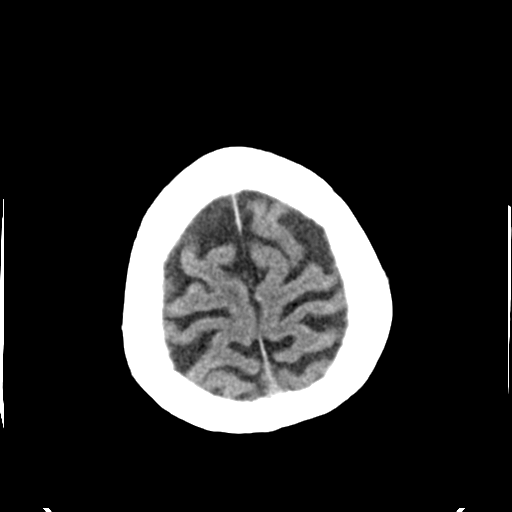
[im 28/32  brain]
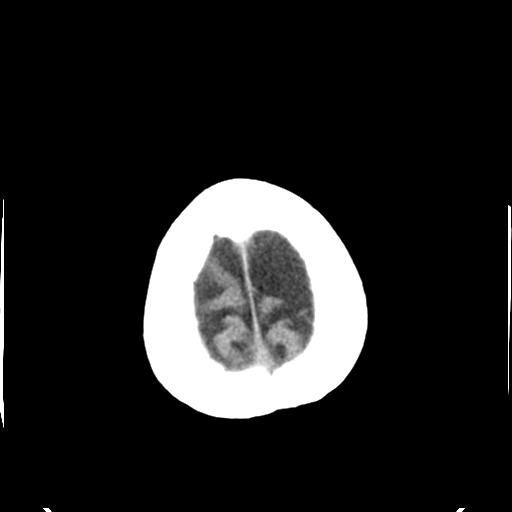
[im 30/32  brain]
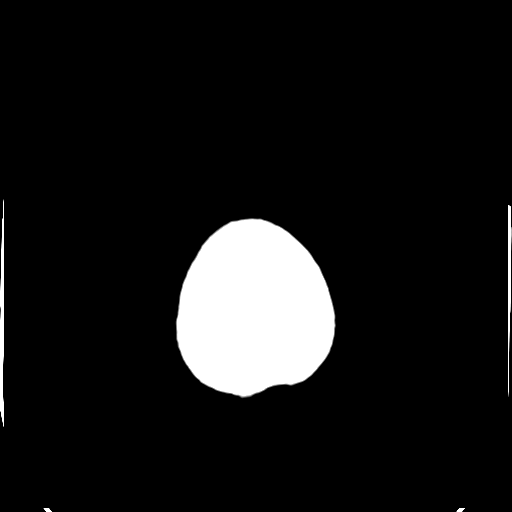

[16 of 30 positions shown; findings below may reference images not displayed]

FINDINGS: Mild atrophy and white matter disease is similar to the prior exam.
No acute cortical infarct, hemorrhage, or mass lesion is present.
The ventricles are proportionate to the degree of atrophy. No
significant extra-axial fluid collection is present.

Fluid is present in the left sphenoid sinus. The paranasal sinuses
and mastoid air cells are otherwise clear. The osseous skull is
intact.
IMPRESSION: 1. Stable atrophy and white matter disease, within normal limits for
age.
2. No acute intracranial abnormality.
3. Fluid in the left sphenoid sinus.

## 2014-10-24 ENCOUNTER — Encounter (HOSPITAL_COMMUNITY): Payer: Self-pay | Admitting: Physical Medicine and Rehabilitation

## 2014-10-24 ENCOUNTER — Emergency Department (HOSPITAL_COMMUNITY): Payer: Commercial Managed Care - HMO

## 2014-10-24 ENCOUNTER — Emergency Department (HOSPITAL_COMMUNITY)
Admission: EM | Admit: 2014-10-24 | Discharge: 2014-10-24 | Disposition: A | Discharge status: Home / Self Care | Payer: Commercial Managed Care - HMO | Attending: Emergency Medicine | Admitting: Emergency Medicine

## 2014-10-24 DIAGNOSIS — Z86010 Personal history of colonic polyps: Secondary | ICD-10-CM | POA: Insufficient documentation

## 2014-10-24 DIAGNOSIS — Z8709 Personal history of other diseases of the respiratory system: Secondary | ICD-10-CM | POA: Diagnosis not present

## 2014-10-24 DIAGNOSIS — Z8619 Personal history of other infectious and parasitic diseases: Secondary | ICD-10-CM | POA: Insufficient documentation

## 2014-10-24 DIAGNOSIS — I129 Hypertensive chronic kidney disease with stage 1 through stage 4 chronic kidney disease, or unspecified chronic kidney disease: Secondary | ICD-10-CM | POA: Diagnosis not present

## 2014-10-24 DIAGNOSIS — E039 Hypothyroidism, unspecified: Secondary | ICD-10-CM | POA: Diagnosis not present

## 2014-10-24 DIAGNOSIS — Z7982 Long term (current) use of aspirin: Secondary | ICD-10-CM | POA: Diagnosis not present

## 2014-10-24 DIAGNOSIS — Z862 Personal history of diseases of the blood and blood-forming organs and certain disorders involving the immune mechanism: Secondary | ICD-10-CM | POA: Diagnosis not present

## 2014-10-24 DIAGNOSIS — M1990 Unspecified osteoarthritis, unspecified site: Secondary | ICD-10-CM | POA: Diagnosis not present

## 2014-10-24 DIAGNOSIS — Z8701 Personal history of pneumonia (recurrent): Secondary | ICD-10-CM | POA: Insufficient documentation

## 2014-10-24 DIAGNOSIS — Z87440 Personal history of urinary (tract) infections: Secondary | ICD-10-CM | POA: Insufficient documentation

## 2014-10-24 DIAGNOSIS — Z79899 Other long term (current) drug therapy: Secondary | ICD-10-CM | POA: Insufficient documentation

## 2014-10-24 DIAGNOSIS — N189 Chronic kidney disease, unspecified: Secondary | ICD-10-CM | POA: Diagnosis not present

## 2014-10-24 DIAGNOSIS — R42 Dizziness and giddiness: Secondary | ICD-10-CM | POA: Insufficient documentation

## 2014-10-24 DIAGNOSIS — R079 Chest pain, unspecified: Secondary | ICD-10-CM | POA: Diagnosis not present

## 2014-10-24 DIAGNOSIS — Z87891 Personal history of nicotine dependence: Secondary | ICD-10-CM | POA: Diagnosis not present

## 2014-10-24 DIAGNOSIS — R011 Cardiac murmur, unspecified: Secondary | ICD-10-CM | POA: Insufficient documentation

## 2014-10-24 DIAGNOSIS — H538 Other visual disturbances: Secondary | ICD-10-CM | POA: Insufficient documentation

## 2014-10-24 DIAGNOSIS — I517 Cardiomegaly: Secondary | ICD-10-CM | POA: Diagnosis not present

## 2014-10-24 DIAGNOSIS — R110 Nausea: Secondary | ICD-10-CM | POA: Diagnosis not present

## 2014-10-24 DIAGNOSIS — R51 Headache: Secondary | ICD-10-CM | POA: Insufficient documentation

## 2014-10-24 LAB — CBC WITH DIFFERENTIAL/PLATELET
BASOS PCT: 0 % (ref 0–1)
Basophils Absolute: 0 10*3/uL (ref 0.0–0.1)
EOS ABS: 0.1 10*3/uL (ref 0.0–0.7)
EOS PCT: 1 % (ref 0–5)
HCT: 35.4 % — ABNORMAL LOW (ref 36.0–46.0)
Hemoglobin: 11.9 g/dL — ABNORMAL LOW (ref 12.0–15.0)
LYMPHS PCT: 25 % (ref 12–46)
Lymphs Abs: 1.9 10*3/uL (ref 0.7–4.0)
MCH: 29.4 pg (ref 26.0–34.0)
MCHC: 33.6 g/dL (ref 30.0–36.0)
MCV: 87.4 fL (ref 78.0–100.0)
MONO ABS: 0.3 10*3/uL (ref 0.1–1.0)
Monocytes Relative: 4 % (ref 3–12)
NEUTROS ABS: 5.4 10*3/uL (ref 1.7–7.7)
Neutrophils Relative %: 70 % (ref 43–77)
Platelets: 234 10*3/uL (ref 150–400)
RBC: 4.05 MIL/uL (ref 3.87–5.11)
RDW: 12.9 % (ref 11.5–15.5)
WBC: 7.8 10*3/uL (ref 4.0–10.5)

## 2014-10-24 LAB — COMPREHENSIVE METABOLIC PANEL
ALK PHOS: 74 U/L (ref 38–126)
ALT: 15 U/L (ref 14–54)
AST: 17 U/L (ref 15–41)
Albumin: 3.5 g/dL (ref 3.5–5.0)
Anion gap: 8 (ref 5–15)
BUN: 15 mg/dL (ref 6–20)
CHLORIDE: 107 mmol/L (ref 101–111)
CO2: 26 mmol/L (ref 22–32)
Calcium: 9 mg/dL (ref 8.9–10.3)
Creatinine, Ser: 0.91 mg/dL (ref 0.44–1.00)
GFR calc Af Amer: >60 mL/min (ref >60)
GFR calc non Af Amer: 53 mL/min — ABNORMAL LOW (ref >60)
Glucose, Bld: 129 mg/dL — ABNORMAL HIGH (ref 65–99)
POTASSIUM: 4 mmol/L (ref 3.5–5.1)
Sodium: 141 mmol/L (ref 135–145)
TOTAL PROTEIN: 6.5 g/dL (ref 6.5–8.1)
Total Bilirubin: 0.5 mg/dL (ref 0.3–1.2)

## 2014-10-24 LAB — URINALYSIS, ROUTINE W REFLEX MICROSCOPIC
Bilirubin Urine: NEGATIVE
GLUCOSE, UA: NEGATIVE mg/dL
Hgb urine dipstick: NEGATIVE
Ketones, ur: NEGATIVE mg/dL
NITRITE: NEGATIVE
Protein, ur: NEGATIVE mg/dL
Specific Gravity, Urine: 1.01 (ref 1.005–1.030)
UROBILINOGEN UA: 0.2 mg/dL (ref 0.0–1.0)
pH: 5 (ref 5.0–8.0)

## 2014-10-24 LAB — URINE MICROSCOPIC-ADD ON

## 2014-10-24 LAB — D-DIMER, QUANTITATIVE: D-Dimer, Quant: 0.4 ug/mL-FEU (ref 0.00–0.48)

## 2014-10-24 LAB — I-STAT TROPONIN, ED: TROPONIN I, POC: 0 ng/mL (ref 0.00–0.08)

## 2014-10-24 MED ORDER — DIAZEPAM 2 MG PO TABS
2.0000 mg | ORAL_TABLET | Freq: Once | ORAL | Status: AC
  Administered 2014-10-24: 2 mg via ORAL
  Filled 2014-10-24: qty 1

## 2014-10-24 NOTE — Discharge Instructions (Signed)
Dizziness   Dizziness means you feel unsteady or lightheaded. You might feel like you are going to pass out (faint).  HOME CARE   · Drink enough fluids to keep your pee (urine) clear or pale yellow.  · Take your medicines exactly as told by your doctor. If you take blood pressure medicine, always stand up slowly from the lying or sitting position. Hold on to something to steady yourself.  · If you need to stand in one place for a long time, move your legs often. Tighten and relax your leg muscles.  · Have someone stay with you until you feel okay.  · Do not drive or use heavy machinery if you feel dizzy.  · Do not drink alcohol.  GET HELP RIGHT AWAY IF:   · You feel dizzy or lightheaded and it gets worse.  · You feel sick to your stomach (nauseous), or you throw up (vomit).  · You have trouble talking or walking.  · You feel weak or have trouble using your arms, hands, or legs.  · You cannot think clearly or have trouble forming sentences.  · You have chest pain, belly (abdominal) pain, sweating, or you are short of breath.  · Your vision changes.  · You are bleeding.  · You have problems from your medicine that seem to be getting worse.  MAKE SURE YOU:   · Understand these instructions.  · Will watch your condition.  · Will get help right away if you are not doing well or get worse.  Document Released: 05/08/2011 Document Revised: 08/11/2011 Document Reviewed: 05/08/2011  [employer]® Patient Information ©2015 [employer]. This information is not intended to replace advice given to you by your health care provider. Make sure you discuss any questions you have with your health care provider.

## 2014-10-24 NOTE — ED Provider Notes (Signed)
CSN: [id number]     Arrival date & time 10/24/14  1318 History   None    Chief Complaint  Patient presents with  . Dizziness  . Blurred Vision  . Headache     (Consider location/radiation/quality/duration/timing/severity/associated sxs/prior Treatment) Patient is a 81 y.o. female presenting with dizziness. The history is provided by the patient.  Dizziness Quality:  Head spinning and lightheadedness Severity:  Moderate Onset quality:  Gradual Duration:  1 day Timing:  Constant Progression:  Unchanged Chronicity:  New Context: head movement and standing up   Relieved by:  Lying down Worsened by:  Turning head and standing up Ineffective treatments:  None tried Associated symptoms: chest pain, headaches, nausea, shortness of breath and vision changes   Associated symptoms: no diarrhea, no palpitations, no syncope, no vomiting and no weakness   Chest pain:    Quality: sharp     Severity:  Mild   Onset quality:  Gradual   Duration:  1 day   Timing:  Constant   Progression:  Unchanged   Chronicity:  New Headaches:    Severity:  Mild   Onset quality:  Gradual   Duration:  1 day   Timing:  Constant   Progression:  Unchanged Shortness of breath:    Severity:  Mild   Onset quality:  Gradual   Duration:  1 day   Timing:  Constant   Progression:  Unchanged   Past Medical History  Diagnosis Date  . Arthritis   . Chicken pox   . Chronic kidney disease   . Colon polyps   . Hypertension   . Dementia   . Angina   . Dysrhythmia   . Heart murmur   . Shortness of breath   . Recurrent upper respiratory infection (URI)   . Pneumonia   . Hypothyroidism   . Anemia   . Headache(784.0)   . Anxiety   . Depression   . Valvular heart disease 09/04/2012  . Left leg pain 10/02/2012  . UTI (urinary tract infection) 12/30/2012  . Acute bronchitis 04/10/2013  . Dehydration 08/31/2013  . Arthritis 10/02/2012   Past Surgical History  Procedure Laterality Date  . Appendectomy    .  Cholecystectomy    . Kidney stones    . Tonsillectomy    . Abdominal hysterectomy    . Hand surgery    . Feet surgery    . Shoulder arthroscopy    . Back surgery    . Tonsillectomy    . Eye surgery      cataract removed and eye lids lifted  . Fracture surgery      bilateral arms  . Tubal ligation    . Colonoscopy w/ polypectomy     Family History  Problem Relation Age of Onset  . Heart disease Mother   . Heart disease Father   . Heart disease Other   . Birth defects Other    History  Substance Use Topics  . Smoking status: Former Smoker -- 0.20 packs/day for 1 years    Types: Cigarettes    Quit date: 06/02/1941  . Smokeless tobacco: Never Used  . Alcohol Use: No   OB History    No data available     Review of Systems  Constitutional: Positive for activity change and appetite change. Negative for fever, diaphoresis and fatigue.  Eyes: Positive for visual disturbance (blurred vision). Negative for photophobia.  Respiratory: Positive for shortness of breath. Negative for cough and chest tightness.  Cardiovascular: Positive for chest pain. Negative for palpitations, leg swelling and syncope.  Gastrointestinal: Positive for nausea. Negative for vomiting, abdominal pain, diarrhea and abdominal distention.  Genitourinary: Negative for dysuria, flank pain and difficulty urinating.  Musculoskeletal: Negative for back pain, neck pain and neck stiffness.  Skin: Negative for color change, pallor and rash.  Neurological: Positive for dizziness, light-headedness and headaches. Negative for syncope, facial asymmetry, speech difficulty, weakness and numbness.  All other systems reviewed and are negative.     Allergies  Codeine and Hydrocodone  Home Medications   Prior to Admission medications   Medication Sig Start Date End Date Taking? Authorizing Provider  acetaminophen (TYLENOL) 325 MG tablet Take 650 mg by mouth every 6 (six) hours as needed for moderate pain.   Yes  Historical Provider, MD  albuterol (PROVENTIL HFA;VENTOLIN HFA) 108 (90 BASE) MCG/ACT inhaler Inhale 2 puffs into the lungs every 6 (six) hours as needed for wheezing or shortness of breath. 09/23/13  Yes Sandford Craze, NP  aspirin 81 MG tablet Take 81 mg by mouth daily.   Yes Historical Provider, MD  levothyroxine (SYNTHROID, LEVOTHROID) 50 MCG tablet TAKE 1 TABLET (50 MCG TOTAL) BY MOUTH DAILY. 09/25/14  Yes Judie Bonus, MD  LORazepam (ATIVAN) 1 MG tablet Take 0.5 mg by mouth every 8 (eight) hours as needed for anxiety or sleep.    Yes Historical Provider, MD  losartan (COZAAR) 50 MG tablet TAKE 0.5 TABLETS (25 MG TOTAL) BY MOUTH DAILY. 09/25/14  Yes Judie Bonus, MD  Magnesium 250 MG TABS Take 250 mg by mouth daily.    Yes Historical Provider, MD  donepezil (ARICEPT) 5 MG tablet Take 1 tablet (5 mg total) by mouth at bedtime. Patient not taking: Reported on 09/19/2014 06/08/14   Judie Bonus, MD  ketoconazole (NIZORAL) 2 % cream Apply 1 application topically daily. Patient not taking: Reported on 10/24/2014 04/06/14   Bradd Canary, MD   BP 154/62 mmHg  Pulse 57  Temp(Src) 97.5 F (36.4 C) (Oral)  Resp 14  SpO2 98% Physical Exam  Constitutional: She is oriented to person, place, and time. She appears well-developed and well-nourished. No distress.  HENT:  Head: Normocephalic and atraumatic.  Mouth/Throat: Oropharynx is clear and moist.  Eyes: Conjunctivae and EOM are normal. Pupils are equal, round, and reactive to light.  Neck: Normal range of motion. Neck supple.  Cardiovascular: Normal rate, regular rhythm, normal heart sounds and intact distal pulses.  Exam reveals no gallop and no friction rub.   No murmur heard. Pulmonary/Chest: Effort normal and breath sounds normal. No respiratory distress. She has no wheezes. She has no rales. She exhibits no tenderness.  Abdominal: Soft. Bowel sounds are normal. She exhibits no distension. There is no tenderness. There is no  rebound and no guarding.  Musculoskeletal: Normal range of motion. She exhibits no edema or tenderness.  Neurological: She is alert and oriented to person, place, and time. She has normal strength. No cranial nerve deficit or sensory deficit. She exhibits normal muscle tone. She displays a negative Romberg sign. Coordination normal. GCS eye subscore is 4. GCS verbal subscore is 5. GCS motor subscore is 6.  Skin: Skin is warm and dry. No rash noted. She is not diaphoretic. No erythema. No pallor.  Nursing note and vitals reviewed.   ED Course  Procedures (including critical care time) Labs Review Labs Reviewed  CBC WITH DIFFERENTIAL/PLATELET - Abnormal; Notable for the following:    Hemoglobin 11.9 (*)  HCT 35.4 (*)    All other components within normal limits  COMPREHENSIVE METABOLIC PANEL - Abnormal; Notable for the following:    Glucose, Bld 129 (*)    GFR calc non Af Amer 53 (*)    All other components within normal limits  URINALYSIS, ROUTINE W REFLEX MICROSCOPIC - Abnormal; Notable for the following:    APPearance HAZY (*)    Leukocytes, UA TRACE (*)    All other components within normal limits  URINE MICROSCOPIC-ADD ON - Abnormal; Notable for the following:    Bacteria, UA MANY (*)    All other components within normal limits  URINE CULTURE  D-DIMER, QUANTITATIVE  I-STAT TROPOININ, ED    Imaging Review Dg Chest 2 View  10/26/2014   CLINICAL DATA:  Fatigue for 2 days  EXAM: CHEST  2 VIEW  COMPARISON:  10/24/2014  FINDINGS: Cardiac shadow is at the upper limits of normal in size. The lungs are clear bilaterally. No effusion is. Postsurgical changes are noted in the right shoulder. No acute bony abnormality is seen.  IMPRESSION: No acute abnormality noted.   Electronically Signed   By: Alcide CleverMark  Lukens M.D.   On: 10/26/2014 09:42   Dg Chest 2 View  10/24/2014   CLINICAL DATA:  Dizziness. Blurred vision and headache for 1 day. History of hypertension.  EXAM: CHEST  2 VIEW   COMPARISON:  06/15/2014  FINDINGS: Right worse than left glenohumeral joint osteoarthritis. Midline trachea. Mild cardiomegaly. Mediastinal contours otherwise within normal limits. No pleural effusion or pneumothorax. No congestive failure. Clear lungs.  IMPRESSION: Mild cardiomegaly, without acute disease.   Electronically Signed   By: Jeronimo GreavesKyle  Talbot M.D.   On: 10/24/2014 16:47   Mr Brain Wo Contrast  10/24/2014   CLINICAL DATA:  Dizziness and blurred vision and headache  EXAM: MRI HEAD WITHOUT CONTRAST  TECHNIQUE: Multiplanar, multiecho pulse sequences of the brain and surrounding structures were obtained without intravenous contrast.  COMPARISON:  CT head 07/08/2014.  MRI 10/03/2013  FINDINGS: Generalized atrophy which is stable. Negative for hydrocephalus. Chronic microvascular ischemic change in the white matter stable from the prior study  Negative for acute infarct. Negative for hemorrhage or mass lesion. No edema.  Bilateral lens replacement. No orbital mass. Paranasal sinuses clear. Pituitary normal in size.  IMPRESSION: Atrophy and chronic microvascular ischemia, expected for age  Negative for acute abnormality.   Electronically Signed   By: Marlan Palauharles  Clark M.D.   On: 10/24/2014 19:35     EKG Interpretation   Date/Time:  Tuesday Oct 24 2014 13:56:03 EDT Ventricular Rate:  54 PR Interval:  198 QRS Duration: 130 QT Interval:  452 QTC Calculation: 428 R Axis:   -70 Text Interpretation:  Sinus bradycardia with sinus arrhythmia Left axis  deviation Right bundle branch block Inferior infarct , age undetermined  Anterior infarct , age undetermined No significant change since last  tracing Confirmed by [facility]  MD, Alphonzo LemmingsWHITNEY ([id number]) on 10/24/2014 4:08:51 PM      MDM   Final diagnoses:  Dizziness    81 yo F with PMH of HTN, CKD, presenting with HA, dizziness, blurred vision.  Pt's companion reports pt has not been eating well for quite some time.  Today friend took pt out to restaurant when  she began complaining of dizziness, blurred vision upon standing up.  Pt has difficulty describing symptoms, unsure if it felt like vertigo but does state that symptoms of dizziness become worse with sudden head movement.  Family reports pt  has also had increased difficulty ambulating with loss of balance over past 3 days.   States that her head feels "heavy". Reports HA this morning similar to priors, has frequent headaches. Also endorses intermittent pleuritic pain in L lateral chest, dyspnea, and LLE pain.  No prior h/o VTE or recent immobilizations.   On exam, pt alert, oriented x 3, in NAD.  CV exam WNL.  Lungs CTAB.  Neuro exam with no focal deficits. Pt does appear to become mildly dyspneic with minimal exertion after neuro exam.  TTP to left calf without erythema or asymmetric swelling.  No other acute findings.  Possible posterior CVA vs infection vs electrolyte abnormality vs PE vs ACS.  EKG shows no acute ischemic changes.  Plan for MR brain, labs, urine, troponin, d-dimer.  Labs, d-dimer, EKG and troponin negative- doubt PE or ACS. MR brain shows no acute abnormalities.  Pt reports resolution of dizziness following treatment with small dose of Valium.  Stable for discharge.  Advised close f/u with PCP.  ED return precautions given.  No other concerns.  Discussed with attending Dr. Anitra Lauth.    Jodean Lima, MD 10/26/14 1233  Gwyneth Sprout, MD 10/26/14 [phone]

## 2014-10-24 NOTE — ED Notes (Signed)
Pt presents to department for evaluation of dizziness, blurred vision and headache. 5/10 pain upon arrival to ED. History of dementia. Pt states "I just don't feel good." no neurological deficits noted. Pt is alert and oriented x4.

## 2014-10-25 LAB — URINE CULTURE
COLONY COUNT: NO GROWTH
Culture: NO GROWTH

## 2014-10-26 ENCOUNTER — Emergency Department (HOSPITAL_COMMUNITY): Payer: Commercial Managed Care - HMO

## 2014-10-26 ENCOUNTER — Encounter (HOSPITAL_COMMUNITY): Payer: Self-pay | Admitting: Emergency Medicine

## 2014-10-26 ENCOUNTER — Emergency Department (HOSPITAL_BASED_OUTPATIENT_CLINIC_OR_DEPARTMENT_OTHER)
Admit: 2014-10-26 | Discharge: 2014-10-26 | Disposition: A | Discharge status: Home / Self Care | Payer: Commercial Managed Care - HMO | Attending: Emergency Medicine | Admitting: Emergency Medicine

## 2014-10-26 ENCOUNTER — Observation Stay (HOSPITAL_COMMUNITY)
Admission: EM | Admit: 2014-10-26 | Discharge: 2014-10-28 | Disposition: A | Discharge status: Home / Self Care | Payer: Commercial Managed Care - HMO | Attending: Internal Medicine | Admitting: Internal Medicine

## 2014-10-26 DIAGNOSIS — E860 Dehydration: Secondary | ICD-10-CM | POA: Diagnosis present

## 2014-10-26 DIAGNOSIS — Z7982 Long term (current) use of aspirin: Secondary | ICD-10-CM | POA: Diagnosis not present

## 2014-10-26 DIAGNOSIS — F329 Major depressive disorder, single episode, unspecified: Secondary | ICD-10-CM | POA: Diagnosis not present

## 2014-10-26 DIAGNOSIS — Z86010 Personal history of colonic polyps: Secondary | ICD-10-CM | POA: Insufficient documentation

## 2014-10-26 DIAGNOSIS — I10 Essential (primary) hypertension: Secondary | ICD-10-CM | POA: Diagnosis present

## 2014-10-26 DIAGNOSIS — F0390 Unspecified dementia without behavioral disturbance: Secondary | ICD-10-CM | POA: Diagnosis not present

## 2014-10-26 DIAGNOSIS — R627 Adult failure to thrive: Secondary | ICD-10-CM | POA: Diagnosis present

## 2014-10-26 DIAGNOSIS — R410 Disorientation, unspecified: Secondary | ICD-10-CM

## 2014-10-26 DIAGNOSIS — R300 Dysuria: Secondary | ICD-10-CM | POA: Diagnosis present

## 2014-10-26 DIAGNOSIS — N390 Urinary tract infection, site not specified: Principal | ICD-10-CM | POA: Insufficient documentation

## 2014-10-26 DIAGNOSIS — I209 Angina pectoris, unspecified: Secondary | ICD-10-CM | POA: Insufficient documentation

## 2014-10-26 DIAGNOSIS — R011 Cardiac murmur, unspecified: Secondary | ICD-10-CM | POA: Insufficient documentation

## 2014-10-26 DIAGNOSIS — N189 Chronic kidney disease, unspecified: Secondary | ICD-10-CM | POA: Insufficient documentation

## 2014-10-26 DIAGNOSIS — R404 Transient alteration of awareness: Secondary | ICD-10-CM | POA: Diagnosis not present

## 2014-10-26 DIAGNOSIS — Z8701 Personal history of pneumonia (recurrent): Secondary | ICD-10-CM | POA: Insufficient documentation

## 2014-10-26 DIAGNOSIS — Z79899 Other long term (current) drug therapy: Secondary | ICD-10-CM | POA: Insufficient documentation

## 2014-10-26 DIAGNOSIS — F419 Anxiety disorder, unspecified: Secondary | ICD-10-CM | POA: Insufficient documentation

## 2014-10-26 DIAGNOSIS — R8290 Unspecified abnormal findings in urine: Secondary | ICD-10-CM | POA: Diagnosis present

## 2014-10-26 DIAGNOSIS — M79609 Pain in unspecified limb: Secondary | ICD-10-CM | POA: Diagnosis not present

## 2014-10-26 DIAGNOSIS — E039 Hypothyroidism, unspecified: Secondary | ICD-10-CM | POA: Diagnosis present

## 2014-10-26 DIAGNOSIS — Z87891 Personal history of nicotine dependence: Secondary | ICD-10-CM | POA: Insufficient documentation

## 2014-10-26 DIAGNOSIS — I129 Hypertensive chronic kidney disease with stage 1 through stage 4 chronic kidney disease, or unspecified chronic kidney disease: Secondary | ICD-10-CM | POA: Insufficient documentation

## 2014-10-26 DIAGNOSIS — R531 Weakness: Secondary | ICD-10-CM | POA: Diagnosis not present

## 2014-10-26 DIAGNOSIS — Z8619 Personal history of other infectious and parasitic diseases: Secondary | ICD-10-CM | POA: Diagnosis not present

## 2014-10-26 DIAGNOSIS — D649 Anemia, unspecified: Secondary | ICD-10-CM | POA: Diagnosis present

## 2014-10-26 DIAGNOSIS — R5383 Other fatigue: Secondary | ICD-10-CM | POA: Diagnosis not present

## 2014-10-26 DIAGNOSIS — G2581 Restless legs syndrome: Secondary | ICD-10-CM | POA: Diagnosis present

## 2014-10-26 LAB — RETICULOCYTES
RBC.: 4.13 MIL/uL (ref 3.87–5.11)
RETIC COUNT ABSOLUTE: 37.2 10*3/uL (ref 19.0–186.0)
Retic Ct Pct: 0.9 % (ref 0.4–3.1)

## 2014-10-26 LAB — COMPREHENSIVE METABOLIC PANEL
ALK PHOS: 72 U/L (ref 38–126)
ALT: 15 U/L (ref 14–54)
AST: 15 U/L (ref 15–41)
Albumin: 3.4 g/dL — ABNORMAL LOW (ref 3.5–5.0)
Anion gap: 9 (ref 5–15)
BILIRUBIN TOTAL: 0.8 mg/dL (ref 0.3–1.2)
BUN: 14 mg/dL (ref 6–20)
CALCIUM: 9 mg/dL (ref 8.9–10.3)
CHLORIDE: 107 mmol/L (ref 101–111)
CO2: 26 mmol/L (ref 22–32)
CREATININE: 0.93 mg/dL (ref 0.44–1.00)
GFR calc Af Amer: 60 mL/min — ABNORMAL LOW (ref >60)
GFR calc non Af Amer: 52 mL/min — ABNORMAL LOW (ref >60)
Glucose, Bld: 102 mg/dL — ABNORMAL HIGH (ref 65–99)
POTASSIUM: 3.8 mmol/L (ref 3.5–5.1)
Sodium: 142 mmol/L (ref 135–145)
TOTAL PROTEIN: 6.3 g/dL — AB (ref 6.5–8.1)

## 2014-10-26 LAB — URINALYSIS, ROUTINE W REFLEX MICROSCOPIC
Bilirubin Urine: NEGATIVE
GLUCOSE, UA: NEGATIVE mg/dL
Ketones, ur: NEGATIVE mg/dL
Nitrite: NEGATIVE
Protein, ur: NEGATIVE mg/dL
Specific Gravity, Urine: 1.007 (ref 1.005–1.030)
Urobilinogen, UA: 0.2 mg/dL (ref 0.0–1.0)
pH: 7 (ref 5.0–8.0)

## 2014-10-26 LAB — IRON AND TIBC
IRON: 58 ug/dL (ref 28–170)
Saturation Ratios: 16 % (ref 10.4–31.8)
TIBC: 354 ug/dL (ref 250–450)
UIBC: 296 ug/dL

## 2014-10-26 LAB — CBC WITH DIFFERENTIAL/PLATELET
BASOS PCT: 0 % (ref 0–1)
Basophils Absolute: 0 10*3/uL (ref 0.0–0.1)
EOS ABS: 0.1 10*3/uL (ref 0.0–0.7)
Eosinophils Relative: 1 % (ref 0–5)
HCT: 34.8 % — ABNORMAL LOW (ref 36.0–46.0)
Hemoglobin: 11.8 g/dL — ABNORMAL LOW (ref 12.0–15.0)
LYMPHS ABS: 2.1 10*3/uL (ref 0.7–4.0)
Lymphocytes Relative: 28 % (ref 12–46)
MCH: 29.4 pg (ref 26.0–34.0)
MCHC: 33.9 g/dL (ref 30.0–36.0)
MCV: 86.6 fL (ref 78.0–100.0)
Monocytes Absolute: 0.4 10*3/uL (ref 0.1–1.0)
Monocytes Relative: 5 % (ref 3–12)
NEUTROS ABS: 4.8 10*3/uL (ref 1.7–7.7)
Neutrophils Relative %: 66 % (ref 43–77)
PLATELETS: 218 10*3/uL (ref 150–400)
RBC: 4.02 MIL/uL (ref 3.87–5.11)
RDW: 12.7 % (ref 11.5–15.5)
WBC: 7.3 10*3/uL (ref 4.0–10.5)

## 2014-10-26 LAB — FERRITIN: Ferritin: 44 ng/mL (ref 11–307)

## 2014-10-26 LAB — TSH: TSH: 2.447 u[IU]/mL (ref 0.350–4.500)

## 2014-10-26 LAB — URINE MICROSCOPIC-ADD ON

## 2014-10-26 LAB — VITAMIN B12: VITAMIN B 12: 277 pg/mL (ref 180–914)

## 2014-10-26 LAB — FOLATE: Folate: 9.1 ng/mL (ref >5.9)

## 2014-10-26 MED ORDER — LORAZEPAM 0.5 MG PO TABS
0.5000 mg | ORAL_TABLET | Freq: Three times a day (TID) | ORAL | Status: DC | PRN
  Administered 2014-10-26 – 2014-10-27 (×2): 0.5 mg via ORAL
  Filled 2014-10-26 (×2): qty 1

## 2014-10-26 MED ORDER — ASPIRIN 81 MG PO CHEW
81.0000 mg | CHEWABLE_TABLET | Freq: Every day | ORAL | Status: DC
  Administered 2014-10-26 – 2014-10-28 (×3): 81 mg via ORAL
  Filled 2014-10-26 (×3): qty 1

## 2014-10-26 MED ORDER — MAGNESIUM OXIDE 400 (241.3 MG) MG PO TABS
400.0000 mg | ORAL_TABLET | Freq: Every day | ORAL | Status: DC
Start: 2014-10-26 — End: 2014-10-28
  Administered 2014-10-26 – 2014-10-28 (×3): 400 mg via ORAL
  Filled 2014-10-26 (×3): qty 1

## 2014-10-26 MED ORDER — MAGNESIUM 250 MG PO TABS: 250.0000 mg | ORAL_TABLET | Freq: Every day | ORAL | Status: DC

## 2014-10-26 MED ORDER — LEVOTHYROXINE SODIUM 50 MCG PO TABS
50.0000 ug | ORAL_TABLET | Freq: Every day | ORAL | Status: DC
  Administered 2014-10-27 – 2014-10-28 (×2): 50 ug via ORAL
  Filled 2014-10-26 (×3): qty 1

## 2014-10-26 MED ORDER — ONDANSETRON HCL 4 MG/2ML IJ SOLN: 4.0000 mg | Freq: Four times a day (QID) | INTRAMUSCULAR | Status: DC | PRN

## 2014-10-26 MED ORDER — SODIUM CHLORIDE 0.9 % IV SOLN
INTRAVENOUS | Status: DC
  Administered 2014-10-26 – 2014-10-27 (×2): via INTRAVENOUS

## 2014-10-26 MED ORDER — ENOXAPARIN SODIUM 40 MG/0.4ML ~~LOC~~ SOLN
40.0000 mg | SUBCUTANEOUS | Status: DC
  Administered 2014-10-26 – 2014-10-27 (×2): 40 mg via SUBCUTANEOUS
  Filled 2014-10-26 (×3): qty 0.4

## 2014-10-26 MED ORDER — DOCUSATE SODIUM 100 MG PO CAPS
100.0000 mg | ORAL_CAPSULE | Freq: Two times a day (BID) | ORAL | Status: DC
  Administered 2014-10-26 – 2014-10-28 (×4): 100 mg via ORAL
  Filled 2014-10-26 (×7): qty 1

## 2014-10-26 MED ORDER — SODIUM CHLORIDE 0.9 % IV BOLUS (SEPSIS)
500.0000 mL | Freq: Once | INTRAVENOUS | Status: AC
  Administered 2014-10-26: 500 mL via INTRAVENOUS

## 2014-10-26 MED ORDER — ONDANSETRON HCL 4 MG PO TABS: 4.0000 mg | ORAL_TABLET | Freq: Four times a day (QID) | ORAL | Status: DC | PRN

## 2014-10-26 MED ORDER — ALUM & MAG HYDROXIDE-SIMETH 200-200-20 MG/5ML PO SUSP: 30.0000 mL | Freq: Four times a day (QID) | ORAL | Status: DC | PRN

## 2014-10-26 MED ORDER — DEXTROSE 5 % IV SOLN
1.0000 g | Freq: Once | INTRAVENOUS | Status: AC
  Administered 2014-10-26: 1 g via INTRAVENOUS
  Filled 2014-10-26: qty 10

## 2014-10-26 MED ORDER — FLEET ENEMA 7-19 GM/118ML RE ENEM
1.0000 | ENEMA | Freq: Once | RECTAL | Status: AC | PRN
  Filled 2014-10-26: qty 1

## 2014-10-26 MED ORDER — FOLIC ACID 5 MG/ML IJ SOLN
1.0000 mg | Freq: Every day | INTRAMUSCULAR | Status: DC
  Filled 2014-10-26 (×3): qty 0.2

## 2014-10-26 MED ORDER — POLYETHYLENE GLYCOL 3350 17 G PO PACK
17.0000 g | PACK | Freq: Every day | ORAL | Status: DC | PRN
  Filled 2014-10-26: qty 1

## 2014-10-26 MED ORDER — THIAMINE HCL 100 MG/ML IJ SOLN
100.0000 mg | Freq: Every day | INTRAMUSCULAR | Status: DC
  Administered 2014-10-26 – 2014-10-27 (×2): 100 mg via INTRAVENOUS
  Filled 2014-10-26 (×2): qty 1

## 2014-10-26 MED ORDER — ACETAMINOPHEN 325 MG PO TABS: 650.0000 mg | ORAL_TABLET | Freq: Four times a day (QID) | ORAL | Status: DC | PRN

## 2014-10-26 MED ORDER — SORBITOL 70 % SOLN: 30.0000 mL | Freq: Every day | Status: DC | PRN

## 2014-10-26 MED ORDER — DEXTROSE 5 % IV SOLN
1.0000 g | INTRAVENOUS | Status: DC
  Administered 2014-10-27 – 2014-10-28 (×2): 1 g via INTRAVENOUS
  Filled 2014-10-26 (×2): qty 10

## 2014-10-26 MED ORDER — LOSARTAN POTASSIUM 50 MG PO TABS
50.0000 mg | ORAL_TABLET | Freq: Every day | ORAL | Status: DC
  Administered 2014-10-26 – 2014-10-28 (×3): 50 mg via ORAL
  Filled 2014-10-26 (×3): qty 1

## 2014-10-26 NOTE — ED Notes (Signed)
[place], PA at bedside. 

## 2014-10-26 NOTE — ED Provider Notes (Signed)
CSN: [id number]     Arrival date & time 10/26/14  0751 History   First MD Initiated Contact with Patient 10/26/14 [phone]     Chief Complaint  Patient presents with  . Fatigue  . Dysuria     (Consider location/radiation/quality/duration/timing/severity/associated sxs/prior Treatment) The history is provided by the patient and a relative.     Pt with hx dementia, lives alone, presents with not feeling like herself and dysuria.  States "I don't feel all with it.  I don't feel like me."  Does feel like her head is "heavy" and "my thinker ain't working to good today."  Has had burning with urination for the past 2-3 days.  Notes left lower leg swelling and pain.  Does not remember being in the ED a few days ago.  Level V caveat for dementia.   Past Medical History  Diagnosis Date  . Arthritis   . Chicken pox   . Chronic kidney disease   . Colon polyps   . Hypertension   . Dementia   . Angina   . Dysrhythmia   . Heart murmur   . Shortness of breath   . Recurrent upper respiratory infection (URI)   . Pneumonia   . Hypothyroidism   . Anemia   . Headache(784.0)   . Anxiety   . Depression   . Valvular heart disease 09/04/2012  . Left leg pain 10/02/2012  . UTI (urinary tract infection) 12/30/2012  . Acute bronchitis 04/10/2013  . Dehydration 08/31/2013  . Arthritis 10/02/2012   Past Surgical History  Procedure Laterality Date  . Appendectomy    . Cholecystectomy    . Kidney stones    . Tonsillectomy    . Abdominal hysterectomy    . Hand surgery    . Feet surgery    . Shoulder arthroscopy    . Back surgery    . Tonsillectomy    . Eye surgery      cataract removed and eye lids lifted  . Fracture surgery      bilateral arms  . Tubal ligation    . Colonoscopy w/ polypectomy     Family History  Problem Relation Age of Onset  . Heart disease Mother   . Heart disease Father   . Heart disease Other   . Birth defects Other    History  Substance Use Topics  . Smoking status:  Former Smoker -- 0.20 packs/day for 1 years    Types: Cigarettes    Quit date: 06/02/1941  . Smokeless tobacco: Never Used  . Alcohol Use: No   OB History    No data available     Review of Systems  Unable to perform ROS: Dementia      Allergies  Codeine and Hydrocodone  Home Medications   Prior to Admission medications   Medication Sig Start Date End Date Taking? Authorizing Provider  acetaminophen (TYLENOL) 325 MG tablet Take 650 mg by mouth every 6 (six) hours as needed for moderate pain.    Historical Provider, MD  albuterol (PROVENTIL HFA;VENTOLIN HFA) 108 (90 BASE) MCG/ACT inhaler Inhale 2 puffs into the lungs every 6 (six) hours as needed for wheezing or shortness of breath. 09/23/13   Sandford Craze, NP  aspirin 81 MG tablet Take 81 mg by mouth daily.    Historical Provider, MD  donepezil (ARICEPT) 5 MG tablet Take 1 tablet (5 mg total) by mouth at bedtime. Patient not taking: Reported on 09/19/2014 06/08/14   Judie Bonus,  MD  ketoconazole (NIZORAL) 2 % cream Apply 1 application topically daily. Patient not taking: Reported on 10/24/2014 04/06/14   Bradd Canary, MD  levothyroxine (SYNTHROID, LEVOTHROID) 50 MCG tablet TAKE 1 TABLET (50 MCG TOTAL) BY MOUTH DAILY. 09/25/14   Judie Bonus, MD  LORazepam (ATIVAN) 1 MG tablet Take 0.5 mg by mouth every 8 (eight) hours as needed for anxiety or sleep.     Historical Provider, MD  losartan (COZAAR) 50 MG tablet TAKE 0.5 TABLETS (25 MG TOTAL) BY MOUTH DAILY. 09/25/14   Judie Bonus, MD  Magnesium 250 MG TABS Take 250 mg by mouth daily.     Historical Provider, MD   BP 162/52 mmHg  Pulse 58  Temp(Src) 98.2 F (36.8 C) (Oral)  Resp 20  SpO2 97% Physical Exam  Constitutional: She appears well-developed and well-nourished. No distress.  HENT:  Head: Normocephalic and atraumatic.  Eyes: Conjunctivae are normal.  Neck: Neck supple.  Cardiovascular: Normal rate, regular rhythm and intact distal pulses.    Pulmonary/Chest: Effort normal and breath sounds normal. No respiratory distress. She has no wheezes. She has no rales.  Abdominal: Soft. She exhibits no distension. There is no tenderness. There is no rebound and no guarding.  Musculoskeletal:  Left calf tender to palpation without erythema, edema, warmth.    Neurological: She is alert.  Oriented to self and her age, knows she's "in the hospital."  Does not know the day, date, president.    Skin: She is not diaphoretic.  Nursing note and vitals reviewed.   ED Course  Procedures (including critical care time) Labs Review Labs Reviewed  URINE CULTURE  COMPREHENSIVE METABOLIC PANEL  CBC WITH DIFFERENTIAL/PLATELET  URINALYSIS, ROUTINE W REFLEX MICROSCOPIC (NOT AT [facility])    Imaging Review Dg Chest 2 View  10/26/2014   CLINICAL DATA:  Fatigue for 2 days  EXAM: CHEST  2 VIEW  COMPARISON:  10/24/2014  FINDINGS: Cardiac shadow is at the upper limits of normal in size. The lungs are clear bilaterally. No effusion is. Postsurgical changes are noted in the right shoulder. No acute bony abnormality is seen.  IMPRESSION: No acute abnormality noted.   Electronically Signed   By: Alcide Clever M.D.   On: 10/26/2014 09:42   Dg Chest 2 View  10/24/2014   CLINICAL DATA:  Dizziness. Blurred vision and headache for 1 day. History of hypertension.  EXAM: CHEST  2 VIEW  COMPARISON:  06/15/2014  FINDINGS: Right worse than left glenohumeral joint osteoarthritis. Midline trachea. Mild cardiomegaly. Mediastinal contours otherwise within normal limits. No pleural effusion or pneumothorax. No congestive failure. Clear lungs.  IMPRESSION: Mild cardiomegaly, without acute disease.   Electronically Signed   By: Jeronimo Greaves M.D.   On: 10/24/2014 16:47   Mr Brain Wo Contrast  10/24/2014   CLINICAL DATA:  Dizziness and blurred vision and headache  EXAM: MRI HEAD WITHOUT CONTRAST  TECHNIQUE: Multiplanar, multiecho pulse sequences of the brain and surrounding structures  were obtained without intravenous contrast.  COMPARISON:  CT head 07/08/2014.  MRI 10/03/2013  FINDINGS: Generalized atrophy which is stable. Negative for hydrocephalus. Chronic microvascular ischemic change in the white matter stable from the prior study  Negative for acute infarct. Negative for hemorrhage or mass lesion. No edema.  Bilateral lens replacement. No orbital mass. Paranasal sinuses clear. Pituitary normal in size.  IMPRESSION: Atrophy and chronic microvascular ischemia, expected for age  Negative for acute abnormality.   Electronically Signed   By: Marlan Palau  M.D.   On: 10/24/2014 19:35     EKG Interpretation None       9:32 AM Discussed pt with Dr Gwendolyn GrantWalden who will also see the patient.   10:13 AM I spoke at length with patient's son and daughter in law.  They live in the same building as the patient.  They have not had success in placing her in any kind of assisted care facility in the past but note that her dementia is getting much worse and they are concerned about her.  Pt does not take her medications regularly, refuses to take aricept, and has not been eating or drinking over the past few days.  Will start abx for UTI, give IVF, consult to social work.  Discussed with Dr Gwendolyn GrantWalden who recommends admission to the hospital.  Pt wakes up intermittently during conversation and asks where she is and who I am.   10:37 AM I spoke with [facility] who accepts patient for admission, will place own holding orders.    MDM   Final diagnoses:  UTI (lower urinary tract infection)  Dehydration  Confusion   Afebrile nontoxic patient with vague sensation of not feeling like herself, also heavy head and dysuria.  Pt seen in ED 10/24/14 but ED note not completed at this time.  Urine culture from that date has had no growth.  CT head, CXR, troponin negative two days ago.  Pt has had negative venous duplex of LLE in February, but pt unable to give timeframe of current symptoms.  Recent PCP  note reviewed, pt has been resistant to taking her aricept and has been hiding it.  She does live alone.  UA today appears positive.  Family members concerned she is not taking meds, not eating or drinking, concern for her safety as she lives alone. IVF, rocephin given. Admitted to [facility].    Trixie Dredgemily Etherine Mackowiak, PA-C 10/26/14 1332  Elwin MochaBlair Walden, MD 10/26/14 [phone]

## 2014-10-26 NOTE — Progress Notes (Signed)
Admission note:  Arrival Method: ED stretcher  Mental Orientation: Alert Oriented to person  Telemetry: not ordered  Assessment: complete  Skin: intact; generalized moles covering body. No open areas  IV: right RA with normal saline at 1875mL/hr  Pain: pt denies  Tubes:N/A Safety Measures: Patient Handbook has been given, and discussed the Fall Prevention worksheet; high fall risk. Left at bedside  Admission: Completed and admission orders have been written  6E Orientation: Patient has been oriented to the unit, staff and to the room.  Family: At the bedside: son and daughter-in-law     [name] BSN, RN [facility] Phone [phone]

## 2014-10-26 NOTE — H&P (Signed)
[facility] History and Physical                                                                                    [name], is a 81 y.o. female  MRN: [id number]   DOB - [birth year]  Admit Date - 10/26/2014  Outpatient Primary MD for the patient is Judie Bonus, MD  Referring MD: Gwendolyn Grant / ER  With History of -  Past Medical History  Diagnosis Date  . Arthritis   . Chicken pox   . Chronic kidney disease   . Colon polyps   . Hypertension   . Dementia   . Angina   . Dysrhythmia   . Heart murmur   . Shortness of breath   . Recurrent upper respiratory infection (URI)   . Pneumonia   . Hypothyroidism   . Anemia   . Headache(784.0)   . Anxiety   . Depression   . Valvular heart disease 09/04/2012  . Left leg pain 10/02/2012  . UTI (urinary tract infection) 12/30/2012  . Acute bronchitis 04/10/2013  . Dehydration 08/31/2013  . Arthritis 10/02/2012      Past Surgical History  Procedure Laterality Date  . Appendectomy    . Cholecystectomy    . Kidney stones    . Tonsillectomy    . Abdominal hysterectomy    . Hand surgery    . Feet surgery    . Shoulder arthroscopy    . Back surgery    . Tonsillectomy    . Eye surgery      cataract removed and eye lids lifted  . Fracture surgery      bilateral arms  . Tubal ligation    . Colonoscopy w/ polypectomy      in for   Chief Complaint  Patient presents with  . Fatigue  . Dysuria     HPI This is a 81 yo female with known dementia. No family at bedside and patient unable to contribute to history. Per EDP notes family spoken to and pt with progressive FTT sx's: refusing Aricept, not eating or drinking now with dysuria. Seen in ER 2 days ago for same. Upon our exam she is very confused and stated she didn't known where she was at, then stated she thought she came here with a sick boy.  In ER UA concerning for UTI and DH so started on empiric anbxs and SW consulted for possible SNF placement.     Review of  Systems   In addition to the HPI above,  Unable to obtain fom pt *A full 10 point Review of Systems was done, except as stated above, all other Review of Systems were negative.  Social History History  Substance Use Topics  . Smoking status: Former Smoker -- 0.20 packs/day for 1 years    Types: Cigarettes    Quit date: 06/02/1941  . Smokeless tobacco: Never Used  . Alcohol Use: No    Resides at: Independent living  Lives with: Alone  Ambulatory status: unasisted   Family History Family History  Problem Relation Age of Onset  . Heart disease Mother   . Heart disease Father   .  Heart disease Other   . Birth defects Other      Prior to Admission medications   Medication Sig Start Date End Date Taking? Authorizing Provider  acetaminophen (TYLENOL) 325 MG tablet Take 650 mg by mouth every 6 (six) hours as needed for moderate pain.   Yes Historical Provider, MD  albuterol (PROVENTIL HFA;VENTOLIN HFA) 108 (90 BASE) MCG/ACT inhaler Inhale 2 puffs into the lungs every 6 (six) hours as needed for wheezing or shortness of breath. 09/23/13  Yes Sandford CrazeMelissa O'Sullivan, NP  aspirin 81 MG tablet Take 81 mg by mouth daily.   Yes Historical Provider, MD  levothyroxine (SYNTHROID, LEVOTHROID) 50 MCG tablet TAKE 1 TABLET (50 MCG TOTAL) BY MOUTH DAILY. 09/25/14  Yes Judie BonusElizabeth A Kollar, MD  LORazepam (ATIVAN) 1 MG tablet Take 0.5 mg by mouth every 8 (eight) hours as needed for anxiety or sleep.    Yes Historical Provider, MD  losartan (COZAAR) 50 MG tablet TAKE 0.5 TABLETS (25 MG TOTAL) BY MOUTH DAILY. 09/25/14  Yes Judie BonusElizabeth A Kollar, MD  Magnesium 250 MG TABS Take 250 mg by mouth daily.    Yes Historical Provider, MD  donepezil (ARICEPT) 5 MG tablet Take 1 tablet (5 mg total) by mouth at bedtime. Patient not taking: Reported on 09/19/2014 06/08/14   Judie BonusElizabeth A Kollar, MD  ketoconazole (NIZORAL) 2 % cream Apply 1 application topically daily. Patient not taking: Reported on 10/24/2014 04/06/14   Bradd CanaryStacey A  Blyth, MD    Allergies  Allergen Reactions  . Codeine Rash  . Hydrocodone Rash    Physical Exam  Vitals  Blood pressure 145/68, pulse 57, temperature 98.2 F (36.8 C), temperature source Oral, resp. rate 17, SpO2 98 %.   General:  In no acute distress  Psych:  Flat affect,  Awake Alert, Oriented X name only.   Neuro:   No focal neurological deficits, CN II through XII intact, Strength 5/5 all 4 extremities, Sensation intact all 4 extremities.  ENT:  Ears and Eyes appear Normal, Conjunctivae clear, PER. Moist oral mucosa without erythema or exudates.  Neck:  Supple, No lymphadenopathy appreciated  Respiratory:  Symmetrical chest wall movement, Good air movement bilaterally, CTAB. Room Air  Cardiac:  RRR, No Murmurs, no LE edema noted, no JVD, No carotid bruits, peripheral pulses palpable at 2+  Abdomen:  Positive bowel sounds, Soft, Non tender, Non distended,  No masses appreciated, no obvious hepatosplenomegaly  Skin:  No Cyanosis, Normal Skin Turgor, No Skin Rash or Bruise.  Extremities: Symmetrical without obvious trauma or injury,  no effusions.  Data Review  CBC  Recent Labs Lab 10/24/14 1351 10/26/14 0836  WBC 7.8 7.3  HGB 11.9* 11.8*  HCT 35.4* 34.8*  PLT 234 218  MCV 87.4 86.6  MCH 29.4 29.4  MCHC 33.6 33.9  RDW 12.9 12.7  LYMPHSABS 1.9 2.1  MONOABS 0.3 0.4  EOSABS 0.1 0.1  BASOSABS 0.0 0.0    Chemistries   Recent Labs Lab 10/24/14 1351 10/26/14 0836  NA 141 142  K 4.0 3.8  CL 107 107  CO2 26 26  GLUCOSE 129* 102*  BUN 15 14  CREATININE 0.91 0.93  CALCIUM 9.0 9.0  AST 17 15  ALT 15 15  ALKPHOS 74 72  BILITOT 0.5 0.8    estimated creatinine clearance is 40.7 mL/min (by C-G formula based on Cr of 0.93).  No results for input(s): TSH, T4TOTAL, T3FREE, THYROIDAB in the last 72 hours.  Invalid input(s): FREET3  Coagulation profile No results  for input(s): INR, PROTIME in the last 168 hours.   Recent Labs  10/24/14 1351    DDIMER 0.40    Cardiac Enzymes No results for input(s): CKMB, TROPONINI, MYOGLOBIN in the last 168 hours.  Invalid input(s): CK  Invalid input(s): POCBNP  Urinalysis    Component Value Date/Time   COLORURINE YELLOW 10/26/2014 0851   APPEARANCEUR CLEAR 10/26/2014 0851   LABSPEC 1.007 10/26/2014 0851   PHURINE 7.0 10/26/2014 0851   GLUCOSEU NEGATIVE 10/26/2014 0851   GLUCOSEU NEGATIVE 04/06/2014 1127   HGBUR SMALL* 10/26/2014 0851   BILIRUBINUR NEGATIVE 10/26/2014 0851   BILIRUBINUR small 03/14/2014 1151   KETONESUR NEGATIVE 10/26/2014 0851   PROTEINUR NEGATIVE 10/26/2014 0851   PROTEINUR trace 03/14/2014 1151   UROBILINOGEN 0.2 10/26/2014 0851   UROBILINOGEN 0.2 03/14/2014 1151   NITRITE NEGATIVE 10/26/2014 0851   NITRITE positive 03/14/2014 1151   LEUKOCYTESUR MODERATE* 10/26/2014 0851    Imaging results:   Dg Chest 2 View  10/26/2014   CLINICAL DATA:  Fatigue for 2 days  EXAM: CHEST  2 VIEW  COMPARISON:  10/24/2014  FINDINGS: Cardiac shadow is at the upper limits of normal in size. The lungs are clear bilaterally. No effusion is. Postsurgical changes are noted in the right shoulder. No acute bony abnormality is seen.  IMPRESSION: No acute abnormality noted.   Electronically Signed   By: Alcide Clever M.D.   On: 10/26/2014 09:42   Dg Chest 2 View  10/24/2014   CLINICAL DATA:  Dizziness. Blurred vision and headache for 1 day. History of hypertension.  EXAM: CHEST  2 VIEW  COMPARISON:  06/15/2014  FINDINGS: Right worse than left glenohumeral joint osteoarthritis. Midline trachea. Mild cardiomegaly. Mediastinal contours otherwise within normal limits. No pleural effusion or pneumothorax. No congestive failure. Clear lungs.  IMPRESSION: Mild cardiomegaly, without acute disease.   Electronically Signed   By: Jeronimo Greaves M.D.   On: 10/24/2014 16:47   Mr Brain Wo Contrast  10/24/2014   CLINICAL DATA:  Dizziness and blurred vision and headache  EXAM: MRI HEAD WITHOUT CONTRAST   TECHNIQUE: Multiplanar, multiecho pulse sequences of the brain and surrounding structures were obtained without intravenous contrast.  COMPARISON:  CT head 07/08/2014.  MRI 10/03/2013  FINDINGS: Generalized atrophy which is stable. Negative for hydrocephalus. Chronic microvascular ischemic change in the white matter stable from the prior study  Negative for acute infarct. Negative for hemorrhage or mass lesion. No edema.  Bilateral lens replacement. No orbital mass. Paranasal sinuses clear. Pituitary normal in size.  IMPRESSION: Atrophy and chronic microvascular ischemia, expected for age  Negative for acute abnormality.   Electronically Signed   By: Marlan Palau M.D.   On: 10/24/2014 19:35      Assessment & Plan  Principal Problem:   Dehydration -MEDSURG -IVFs -BMET am  Active Problems:   Dementia/FTT (failure to thrive) in adult -PT/OT eval -SW for possible placement    Hypothyroidism -cont Synthroid -ck TSH    Abnormal urinalysis -cont empiric Rocephin -FU urine cx    Normocytic anemia -ck anemia panel    Hypertension -Cozaar     DVT Prophylaxis: Lovenox  Family Communication:   No family at bedside- spoken to by EDP (see their note)  Code Status:  FULL CODE  Condition:  stable  Discharge disposition: Plan SNF placement pending eval  Time spent in minutes : 60      ELLIS,ALLISON L. ANP on 10/26/2014 at 10:55 AM  Between 7am to 7pm - Pager -  [phone]  After 7pm go to [email or web address] - password [email or web address]  And look for the night coverage person covering me after hours  [facility]

## 2014-10-26 NOTE — Clinical Social Work Note (Signed)
Clinical Social Work Assessment  Patient Details  Name: [name] MRN: [id number] Date of Birth: Mar 31, 1921  Date of referral:  10/26/14               Reason for consult:  Facility Placement                Permission sought to share information with:  Case Manager, Family Supports Permission granted to share information::  Yes, Verbal Permission Granted  Name::     Juanda Crumble (patient son)  [name]'s wife: Health visitor::  none at this time  Relationship::     Contact Information:     Housing/Transportation Living arrangements for the past 2 months:  Apartment (patient son and his wife live 2 doors away from patient) Source of Information:  Scientist, water quality, Adult Children Patient Interpreter Needed:  None Criminal Activity/Legal Involvement Pertinent to Current Situation/Hospitalization:  No - Comment as needed Significant Relationships:  Adult Children, Other Family Members Lives with:  Self Do you feel safe going back to the place where you live?  No (Family reports patient has gotten worse since coming in February) Need for family participation in patient care:  Yes (Comment) (Patient has memory issues)  Care giving concerns:  Patient arrives with son and his wife again for same concerns as last ED admission in February, however this time patient has diagnosis of UTI. Family reports they need her in a long term care facility, locked unit due to her memory decline. Patient ambulates independently prior to admission however in the last 2 days she has been falling more frequently and calling for help from her son.  Son and wife life 2 doors away from patient's apartment, all were approved last September to live closer together. Family reports they are exhausted with caring for patient and patient needs higher level of care.   Social Worker assessment / plan:  LCSW met with family down in the ED as social work consult was placed for SNF. IN February 2016, same consult was placed  however due to patient's insurance she did not qualify as she was more for supervision and less for skilled rehab. Family only has [employer] and were strongly encouraged to apply for Medicaid. Family and patient were set up with [facility] and [facility] but all have recently discharged patient per family as she was doing well. Family reports they did not apply for Medicaid and currently cannot pay privately for long term care. LCSW re-educated family about payor sources for ALF/SNF and gave them another copy of the medicaid application.  [facility] CSW also gave copy of Medicaid application in which is at home somewhere. LCSW also gave a list of locked ALF facilities to review once medicaid is in place.  Family reports they are going to work on application. Patient at this time is being admitted.  LCSW will send handoff to [facility] for follow up. Recommend PT to be consulted in case patient has deconditioned and skilled need can be documented for insurance to approve for ST SNF and then once stronger move to locked memory care unit.  (Again family will have to apply and be approved for Medicaid.     Employment status:  Retired Nurse, adult PT Recommendations:  Not assessed at this time (previous admission to ED, patient did too well walking and with ADLs to qualify her for SNF) Information / Referral to community resources:  Other (Comment Required) (Medicaid Application, ALF List)  Patient/Family's Response to care:  Agreeable to plan  Patient/Family's Understanding of and Emotional Response to Diagnosis, Current Treatment, and Prognosis:  Patient is not oriented to place or situation at this time. NO behaviors and patient very calm and cooperative. Family lacking understanding of need for immediate medicaid in effort to resolve sole care of patient on them. Again educated and reinforced to complete and agreeable. Understanding prognosis of UTI.  Emotional Assessment Appearance:   Appears stated age Attitude/Demeanor/Rapport:  Other, Lethargic (Cooperative) Affect (typically observed):  Pleasant, Quiet Orientation:  Oriented to Self Alcohol / Substance use:  Not Applicable Psych involvement (Current and /or in the community):  No (Comment)  Discharge Needs  Concerns to be addressed:  Adjustment to Illness, Basic Needs, Discharge Planning Concerns, Financial / Insurance Concerns Readmission within the last 30 days:  No Current discharge risk:  Dependent with Mobility Barriers to Discharge:  Continued Medical Work up, [employer]   Marshell Garfinkel 10/26/2014, 12:52 PM

## 2014-10-26 NOTE — ED Notes (Signed)
Per EMS, pt coming in today due to fatigue and dysuria since d/c two days ago. Pt from [facility]. Pt has hx of dementia but is at baseline. NAD at this time.

## 2014-10-26 NOTE — ED Notes (Signed)
Received Report from Kenton ValeJessica, [place]

## 2014-10-26 NOTE — ED Notes (Signed)
Ordered Diet Tray 

## 2014-10-26 NOTE — Evaluation (Addendum)
Physical Therapy Evaluation Patient Details Name: [name] MRN: [id number] DOB: 1920/12/18 Today's Date: 10/26/2014   History of Present Illness  81 y.o. female with h/o dementia and post-polio LLE (per family)  admitted with confusion, falls, FTT, possible UTI. Family would like SNF placement.  Clinical Impression  Pt admitted with above diagnosis. Pt currently with functional limitations due to the deficits listed below (see PT Problem List). Pt ambulated 120' with RW with min/guard assist, no LOB. Per family she's not been eating or taking her medications at her ILF, and has been falling, so SNF is recommended.  Pt will benefit from skilled PT to increase their independence and safety with mobility to allow discharge to the venue listed below.       Follow Up Recommendations SNF    Equipment Recommendations  None recommended by PT    Recommendations for Other Services       Precautions / Restrictions Precautions Precautions: Fall Precaution Comments: 3 falls in past 5 months Restrictions Weight Bearing Restrictions: No      Mobility  Bed Mobility Overal bed mobility: Modified Independent             General bed mobility comments: used rail  Transfers Overall transfer level: Needs assistance Equipment used: Rolling walker (2 wheeled) Transfers: Sit to/from Stand Sit to Stand: Min guard         General transfer comment: min/guard for safety due to recent falls  Ambulation/Gait Ambulation/Gait assistance: Min guard Ambulation Distance (Feet): 120 Feet Assistive device: Rolling walker (2 wheeled) Gait Pattern/deviations: Step-through pattern   Gait velocity interpretation: at or above normal speed for age/gender General Gait Details: min/guard for safety due to recent falls, no LOB, cues to lift head, distance limited by fatigue  Stairs            Wheelchair Mobility    Modified Rankin (Stroke Patients Only)       Balance Overall balance  assessment: History of Falls;Needs assistance   Sitting balance-Leahy Scale: Good       Standing balance-Leahy Scale: Fair                               Pertinent Vitals/Pain Pain Assessment: No/denies pain    Home Living Family/patient expects to be discharged to:: Skilled nursing facility Living Arrangements: Alone Available Help at Discharge: Family;Available PRN/intermittently Type of Home: Independent living facility Home Access: Level entry     Home Layout: One level Home Equipment: Walker - 2 wheels;Cane - single point;Shower seat Additional Comments: son and DIL live in same facility but cannot provide 24* assist, they state pt hasn't been eating and doesn't take her medicine, and that she's had several falls recently, family wants SNF    Prior Function Level of Independence: Independent with assistive device(s)         Comments: uses cane in her apartment, but has had falls, hasn't used RW despite encouragement to do so from family and MD     Hand Dominance        Extremity/Trunk Assessment   Upper Extremity Assessment: Overall WFL for tasks assessed           Lower Extremity Assessment: Generalized weakness (knee extension -4/5 R, +3/5 L)      Cervical / Trunk Assessment: Kyphotic (forward head)  Communication   Communication: No difficulties  Cognition Arousal/Alertness: Awake/alert Behavior During Therapy: WFL for tasks assessed/performed Overall Cognitive Status: History  of cognitive impairments - at baseline                      General Comments      Exercises        Assessment/Plan    PT Assessment Patient needs continued PT services  PT Diagnosis Generalized weakness   PT Problem List Decreased activity tolerance;Decreased mobility;Decreased knowledge of use of DME;Decreased safety awareness  PT Treatment Interventions DME instruction;Gait training;Functional mobility training;Therapeutic  activities;Patient/family education;Therapeutic exercise   PT Goals (Current goals can be found in the Care Plan section) Acute Rehab PT Goals Patient Stated Goal: family wants SNF as pt is having falls and is not eating or taking meds at her ILF PT Goal Formulation: With family Time For Goal Achievement: 11/09/14 Potential to Achieve Goals: Good    Frequency Min 3X/week   Barriers to discharge Decreased caregiver support      Co-evaluation               End of Session Equipment Utilized During Treatment: Gait belt Activity Tolerance: Patient tolerated treatment well Patient left: in chair;with call bell/phone within reach;with family/visitor present Nurse Communication: Mobility status         Time: 1352-1410 PT Time Calculation (min) (ACUTE ONLY): 18 min   Charges:   PT Evaluation $Initial PT Evaluation Tier I: 1 Procedure     PT G Codes:     PT G-Codes **NOT FOR INPATIENT CLASS** Functional Assessment Tool Used clinical judgement clinical judgement at 1420 on 10/26/14 by Alvester Morin, PT Functional Limitation Mobility: Walking and moving around Mobility: Walking and moving around at 1420 on 10/26/14 by Alvester Morin, PT Mobility: Walking and Moving Around Current Status [phone]) At least 20 percent but less than 40 percent impaired, limited or restricted CJ at 1420 on 10/26/14 by Alvester Morin, PT Mobility: Walking and Moving Around Goal Status [phone]) At least 1 percent but less than 20 percent impaired, limited or restricted CI at 1420 on 10/26/14 by Alvester Morin, PT Mobility: Walking and Moving Around Discharge Status [phone])      Tamala Ser 10/26/2014, 2:21 PM [phone]

## 2014-10-26 NOTE — Progress Notes (Signed)
VASCULAR LAB PRELIMINARY  PRELIMINARY  PRELIMINARY  PRELIMINARY  Left lower extremity venous Doppler completed.    Preliminary report:  There is no DVT or SVT noted in the left lower extremity.   Shenandoah Vandergriff, RVT 10/26/2014, 9:10 AM

## 2014-10-27 DIAGNOSIS — F0391 Unspecified dementia with behavioral disturbance: Secondary | ICD-10-CM | POA: Diagnosis not present

## 2014-10-27 DIAGNOSIS — E039 Hypothyroidism, unspecified: Secondary | ICD-10-CM | POA: Diagnosis not present

## 2014-10-27 DIAGNOSIS — I129 Hypertensive chronic kidney disease with stage 1 through stage 4 chronic kidney disease, or unspecified chronic kidney disease: Secondary | ICD-10-CM | POA: Diagnosis not present

## 2014-10-27 DIAGNOSIS — R8290 Unspecified abnormal findings in urine: Secondary | ICD-10-CM

## 2014-10-27 DIAGNOSIS — N390 Urinary tract infection, site not specified: Secondary | ICD-10-CM | POA: Diagnosis not present

## 2014-10-27 DIAGNOSIS — R627 Adult failure to thrive: Secondary | ICD-10-CM | POA: Diagnosis not present

## 2014-10-27 DIAGNOSIS — F0390 Unspecified dementia without behavioral disturbance: Secondary | ICD-10-CM | POA: Diagnosis not present

## 2014-10-27 DIAGNOSIS — I209 Angina pectoris, unspecified: Secondary | ICD-10-CM | POA: Diagnosis not present

## 2014-10-27 DIAGNOSIS — F329 Major depressive disorder, single episode, unspecified: Secondary | ICD-10-CM | POA: Diagnosis not present

## 2014-10-27 DIAGNOSIS — N189 Chronic kidney disease, unspecified: Secondary | ICD-10-CM | POA: Diagnosis not present

## 2014-10-27 DIAGNOSIS — E860 Dehydration: Secondary | ICD-10-CM

## 2014-10-27 DIAGNOSIS — I10 Essential (primary) hypertension: Secondary | ICD-10-CM

## 2014-10-27 DIAGNOSIS — R011 Cardiac murmur, unspecified: Secondary | ICD-10-CM | POA: Diagnosis not present

## 2014-10-27 LAB — URINE CULTURE: Colony Count: 30000

## 2014-10-27 LAB — HEMOGLOBIN A1C
HEMOGLOBIN A1C: 6 % — AB (ref 4.8–5.6)
MEAN PLASMA GLUCOSE: 126 mg/dL

## 2014-10-27 MED ORDER — FOLIC ACID 1 MG PO TABS
1.0000 mg | ORAL_TABLET | Freq: Every day | ORAL | Status: DC
  Administered 2014-10-27 – 2014-10-28 (×2): 1 mg via ORAL
  Filled 2014-10-27 (×2): qty 1

## 2014-10-27 MED ORDER — CYANOCOBALAMIN 1000 MCG/ML IJ SOLN
1000.0000 ug | Freq: Once | INTRAMUSCULAR | Status: AC
  Administered 2014-10-27: 1000 ug via INTRAMUSCULAR
  Filled 2014-10-27 (×2): qty 1

## 2014-10-27 MED ORDER — VITAMIN B-1 100 MG PO TABS
100.0000 mg | ORAL_TABLET | Freq: Every day | ORAL | Status: DC
  Administered 2014-10-28: 100 mg via ORAL
  Filled 2014-10-27 (×2): qty 1

## 2014-10-27 NOTE — Progress Notes (Signed)
PROGRESS NOTE  [name] NFA:[id number] DOB: December 01, 1920 DOA: 10/26/2014 PCP: Judie Bonus, MD Brief History 81 year old female with a history of dementia, CKD stage II-III, hypertension, hypothyroidism presents with progressive failure to thrive, gait instability, frequent falls and dysuria. The patient was discharged from the emergency department on 10/24/2014 after being seen for dizziness and visual disturbance. MRI of the brain on 10/24/2014 was negative for acute findings. The patient was discharged back home. She returned with the above complaints, and was started on intravenous fluids and intravenous ceftriaxone for possible urinary tract infection. Family has noted the patient has had increasing confusion and falls. Assessment/Plan: Acute encephalopathy -Although the patient may have UTI, suspect patient has progressive cognitive decline due to her underlying dementia -Continue IV antibiotics for now pending culture data -Continue IV fluids -PT evaluation--> recommend skilled nursing facility -Patient would ultimately benefit from being placed in a memory care unit-Contingent upon patient's Medicaid approval--this was discussed with the patient's family  Hypertension -Continue losartan Failure to thrive -Nutrition evaluation -Continue IV fluids Bacteriuria -The patient has dysuria, not completely sure she has UTI based on urinalysis -Continue IV ceftriaxone pending culture data  Hypothyroidism  -TSH 2.447  -Continue Synthroid  Dementia -intolerance to aricept in past -may ultimately benefit from exelon patch Impaired glucose tolerance -Hemoglobin A1c 6.0 -Given the patient's age and comorbidities, allow for liberal glycemic control  Family Communication:   Family updated at beside--total time 35 min, >50% spent counseling patient and coordinating care Disposition Plan:   Home when medically stable       Procedures/Studies: Dg Chest 2  View  10/26/2014   CLINICAL DATA:  Fatigue for 2 days  EXAM: CHEST  2 VIEW  COMPARISON:  10/24/2014  FINDINGS: Cardiac shadow is at the upper limits of normal in size. The lungs are clear bilaterally. No effusion is. Postsurgical changes are noted in the right shoulder. No acute bony abnormality is seen.  IMPRESSION: No acute abnormality noted.   Electronically Signed   By: Alcide Clever M.D.   On: 10/26/2014 09:42   Dg Chest 2 View  10/24/2014   CLINICAL DATA:  Dizziness. Blurred vision and headache for 1 day. History of hypertension.  EXAM: CHEST  2 VIEW  COMPARISON:  06/15/2014  FINDINGS: Right worse than left glenohumeral joint osteoarthritis. Midline trachea. Mild cardiomegaly. Mediastinal contours otherwise within normal limits. No pleural effusion or pneumothorax. No congestive failure. Clear lungs.  IMPRESSION: Mild cardiomegaly, without acute disease.   Electronically Signed   By: Jeronimo Greaves M.D.   On: 10/24/2014 16:47   Mr Brain Wo Contrast  10/24/2014   CLINICAL DATA:  Dizziness and blurred vision and headache  EXAM: MRI HEAD WITHOUT CONTRAST  TECHNIQUE: Multiplanar, multiecho pulse sequences of the brain and surrounding structures were obtained without intravenous contrast.  COMPARISON:  CT head 07/08/2014.  MRI 10/03/2013  FINDINGS: Generalized atrophy which is stable. Negative for hydrocephalus. Chronic microvascular ischemic change in the white matter stable from the prior study  Negative for acute infarct. Negative for hemorrhage or mass lesion. No edema.  Bilateral lens replacement. No orbital mass. Paranasal sinuses clear. Pituitary normal in size.  IMPRESSION: Atrophy and chronic microvascular ischemia, expected for age  Negative for acute abnormality.   Electronically Signed   By: Marlan Palau M.D.   On: 10/24/2014 19:35         Subjective: Patient complains of dysuria but denies any fevers, chills, chest  pain, shortness of breath, nausea, vomiting, diarrhea, abdominal pain,  hematuria, hematochezia, melena. She has had some dysuria for up to the past week.   Objective: Filed Vitals:   10/26/14 1635 10/26/14 2153 10/27/14 0537 10/27/14 0737  BP: 159/64 157/58 145/52 154/54  Pulse: 59 59 58 58  Temp: 98 F (36.7 C) 98.9 F (37.2 C) 97.5 F (36.4 C) 97.9 F (36.6 C)  TempSrc: Oral Oral Oral Oral  Resp:  16 18 17   Weight:  81.9 kg (180 lb 8.9 oz)    SpO2: 100% 97% 97% 97%    Intake/Output Summary (Last 24 hours) at 10/27/14 7829 Last data filed at 10/27/14 0902  Gross per 24 hour  Intake 1853.75 ml  Output    300 ml  Net 1553.75 ml   Weight change:  Exam:   General:  Pt is alert, follows commands appropriately, not in acute distress  HEENT: No icterus, No thrush, No neck mass, [place]/AT  Cardiovascular: RRR, S1/S2, no rubs, no gallops  Respiratory: CTA bilaterally, no wheezing, no crackles, no rhonchi  Abdomen: Soft/+BS, non tender, non distended, no guarding  Extremities: trace LE edema, No lymphangitis, No petechiae, No rashes, no synovitis  Data Reviewed: Basic Metabolic Panel:  Recent Labs Lab 10/24/14 1351 10/26/14 0836  NA 141 142  K 4.0 3.8  CL 107 107  CO2 26 26  GLUCOSE 129* 102*  BUN 15 14  CREATININE 0.91 0.93  CALCIUM 9.0 9.0   Liver Function Tests:  Recent Labs Lab 10/24/14 1351 10/26/14 0836  AST 17 15  ALT 15 15  ALKPHOS 74 72  BILITOT 0.5 0.8  PROT 6.5 6.3*  ALBUMIN 3.5 3.4*   No results for input(s): LIPASE, AMYLASE in the last 168 hours. No results for input(s): AMMONIA in the last 168 hours. CBC:  Recent Labs Lab 10/24/14 1351 10/26/14 0836  WBC 7.8 7.3  NEUTROABS 5.4 4.8  HGB 11.9* 11.8*  HCT 35.4* 34.8*  MCV 87.4 86.6  PLT 234 218   Cardiac Enzymes: No results for input(s): CKTOTAL, CKMB, CKMBINDEX, TROPONINI in the last 168 hours. BNP: Invalid input(s): POCBNP CBG: No results for input(s): GLUCAP in the last 168 hours.  Recent Results (from the past 240 hour(s))  Urine culture      Status: None   Collection Time: 10/24/14  5:40 PM  Result Value Ref Range Status   Specimen Description URINE, CLEAN CATCH  Final   Special Requests NONE  Final   Colony Count NO GROWTH Performed at [facility]   Final   Culture NO GROWTH Performed at [facility]   Final   Report Status 10/25/2014 FINAL  Final     Scheduled Meds: . aspirin  81 mg Oral Daily  . cefTRIAXone (ROCEPHIN)  IV  1 g Intravenous Q24H  . docusate sodium  100 mg Oral BID  . enoxaparin (LOVENOX) injection  40 mg Subcutaneous Q24H  . folic acid  1 mg Intravenous Daily  . levothyroxine  50 mcg Oral QAC breakfast  . losartan  50 mg Oral Daily  . magnesium oxide  400 mg Oral Daily  . thiamine  100 mg Intravenous Daily   Continuous Infusions: . sodium chloride 75 mL/hr at 10/27/14 0230     Valentin Benney, DO  [facility] Pager [phone]  If 7PM-7AM, please contact night-coverage [email or web address] Password [facility] 10/27/2014, 9:38 AM   LOS: 1 day

## 2014-10-27 NOTE — Consult Note (Addendum)
[facility] CM Inpatient Consult   10/27/2014  [name] [birth year] [id number] Spoke with patient's son, [name] and [name] regarding the restart of services with [facility] Care Management. They state that currently there is not much anyone can do to assist the patient.  [name] states that she has filled out paper work for applying for [employer] and is suppose to meet with the inpatient social worker to present the paper work today.  They state that they currently would like to see what the patient's disposition will be. Spoke with the inpatient RNCM regarding [facility] Care Management previous involvement.  Encouraged the family to contact [facility] Care Management for needs. Please contact for questions: Charlesetta Shanks, RN BSN CCM [facility]  [phone] business mobile phone

## 2014-10-27 NOTE — Clinical Social Work Placement (Signed)
CLINICAL SOCIAL WORK PLACEMENT  NOTE  Date:  10/27/2014  Patient Details  Name: [name] MRN: [id number] Date of Birth: [birth year]  Clinical Social Work is seeking post-discharge placement for this patient at the Skilled  Nursing Facility level of care (*CSW will initial, date and re-position this form in  chart as items are completed):  Yes   Patient/family provided with [place] Clinical Social Work Department's list of facilities offering this level of care within the geographic area requested by the patient (or if unable, by the patient's family).  Yes   Patient/family informed of their freedom to choose among providers that offer the needed level of care, that participate in Medicare, Medicaid or managed care program needed by the patient, have an available bed and are willing to accept the patient.  Yes   Patient/family informed of [facility]'s ownership interest in [facility] and [facility], as well as of the fact that they are under no obligation to receive care at these facilities.  PASRR submitted to EDS on       PASRR number received on 10/27/14     Existing PASRR number confirmed on       FL2 transmitted to all facilities in geographic area requested by pt/family on 10/27/14     FL2 transmitted to all facilities within larger geographic area on       Patient informed that his/her managed care company has contracts with or will negotiate with certain facilities, including the following:            Patient/family informed of bed offers received.  Patient chooses bed at       Physician recommends and patient chooses bed at      Patient to be transferred to   on  .  Patient to be transferred to facility by       Patient family notified on   of transfer.  Name of family member notified:        PHYSICIAN Please sign FL2     Additional Comment:    _______________________________________________ Izora Ribas, LCSW 10/27/2014, 1:45 PM

## 2014-10-28 DIAGNOSIS — N189 Chronic kidney disease, unspecified: Secondary | ICD-10-CM | POA: Diagnosis not present

## 2014-10-28 DIAGNOSIS — F99 Mental disorder, not otherwise specified: Secondary | ICD-10-CM | POA: Diagnosis not present

## 2014-10-28 DIAGNOSIS — E860 Dehydration: Secondary | ICD-10-CM | POA: Diagnosis not present

## 2014-10-28 DIAGNOSIS — M6281 Muscle weakness (generalized): Secondary | ICD-10-CM | POA: Diagnosis not present

## 2014-10-28 DIAGNOSIS — F0390 Unspecified dementia without behavioral disturbance: Secondary | ICD-10-CM

## 2014-10-28 DIAGNOSIS — R278 Other lack of coordination: Secondary | ICD-10-CM | POA: Diagnosis not present

## 2014-10-28 DIAGNOSIS — I209 Angina pectoris, unspecified: Secondary | ICD-10-CM | POA: Diagnosis not present

## 2014-10-28 DIAGNOSIS — E039 Hypothyroidism, unspecified: Secondary | ICD-10-CM | POA: Diagnosis not present

## 2014-10-28 DIAGNOSIS — I10 Essential (primary) hypertension: Secondary | ICD-10-CM | POA: Diagnosis not present

## 2014-10-28 DIAGNOSIS — F0391 Unspecified dementia with behavioral disturbance: Secondary | ICD-10-CM | POA: Diagnosis not present

## 2014-10-28 DIAGNOSIS — I129 Hypertensive chronic kidney disease with stage 1 through stage 4 chronic kidney disease, or unspecified chronic kidney disease: Secondary | ICD-10-CM | POA: Diagnosis not present

## 2014-10-28 DIAGNOSIS — F329 Major depressive disorder, single episode, unspecified: Secondary | ICD-10-CM | POA: Diagnosis not present

## 2014-10-28 DIAGNOSIS — R011 Cardiac murmur, unspecified: Secondary | ICD-10-CM | POA: Diagnosis not present

## 2014-10-28 DIAGNOSIS — G9340 Encephalopathy, unspecified: Secondary | ICD-10-CM | POA: Diagnosis not present

## 2014-10-28 DIAGNOSIS — R627 Adult failure to thrive: Secondary | ICD-10-CM | POA: Diagnosis not present

## 2014-10-28 DIAGNOSIS — R8290 Unspecified abnormal findings in urine: Secondary | ICD-10-CM | POA: Diagnosis not present

## 2014-10-28 DIAGNOSIS — R296 Repeated falls: Secondary | ICD-10-CM | POA: Diagnosis not present

## 2014-10-28 DIAGNOSIS — R41841 Cognitive communication deficit: Secondary | ICD-10-CM | POA: Diagnosis not present

## 2014-10-28 DIAGNOSIS — N390 Urinary tract infection, site not specified: Secondary | ICD-10-CM | POA: Diagnosis not present

## 2014-10-28 LAB — BASIC METABOLIC PANEL
ANION GAP: 6 (ref 5–15)
BUN: 12 mg/dL (ref 6–20)
CHLORIDE: 110 mmol/L (ref 101–111)
CO2: 25 mmol/L (ref 22–32)
CREATININE: 0.78 mg/dL (ref 0.44–1.00)
Calcium: 8.8 mg/dL — ABNORMAL LOW (ref 8.9–10.3)
GFR calc Af Amer: >60 mL/min (ref >60)
GFR calc non Af Amer: >60 mL/min (ref >60)
GLUCOSE: 103 mg/dL — AB (ref 65–99)
Potassium: 4.1 mmol/L (ref 3.5–5.1)
Sodium: 141 mmol/L (ref 135–145)

## 2014-10-28 MED ORDER — LOSARTAN POTASSIUM 50 MG PO TABS: 50.0000 mg | ORAL_TABLET | Freq: Every day | ORAL | Status: DC

## 2014-10-28 MED ORDER — AMOXICILLIN 500 MG PO CAPS: 500.0000 mg | ORAL_CAPSULE | Freq: Three times a day (TID) | ORAL | Status: DC

## 2014-10-28 NOTE — Discharge Summary (Signed)
Physician Discharge Summary  MALONE SCOBY ZOX:[id number] DOB: Mar 22, 1921 DOA: 10/26/2014  PCP: Judie Bonus, MD  Admit date: 10/26/2014 Discharge date: 10/28/2014  Recommendations for Outpatient Follow-up:  1. Pt will need to follow up with PCP in 2 weeks post discharge   Discharge Diagnoses:  Acute encephalopathy -Although the patient may have UTI, suspect patient has progressive cognitive decline due to her underlying dementia -Patient received 2 days intravenous ceftriaxone -Continued IV fluids during hospitalization -PT evaluation--> recommend skilled nursing facility -Patient would ultimately benefit from being placed in a memory care unit-Contingent upon patient's Medicaid approval--this was discussed with the patient's family  Hypertension -Continue losartan--dose increased to 50 mg daily-->systolic blood pressures 140-150 Failure to thrive -Continue IV fluids -Patient was getting meals on wheels at home -I have encouraged the patient's family to cook or bring whatever food that the patient desires Bacteriuria -Although patient has dysuria, not completely sure she has UTI based on urinalysis -Continue IV ceftriaxone pending culture data  -Given the patient's encephalopathy and failure to thrive, will give 3 more days amoxicillin to complete 5 days of therapy -The patient has had a number of previous urine cultures growing Escherichia coli that was pansensitive. Hypothyroidism  -TSH 2.447  -Continue Synthroid  Dementia -intolerance to aricept in past -may ultimately benefit from exelon patch Impaired glucose tolerance -Hemoglobin A1c 6.0 -Given the patient's age and comorbidities, allow for liberal glycemic control  Discharge Condition: stable  Disposition: SNF  Diet:regular Wt Readings from Last 3 Encounters:  10/27/14 84.369 kg (186 lb)  10/24/14 81.647 kg (180 lb)  09/19/14 81.647 kg (180 lb)    History of present illness:  81 year old female with  a history of dementia, CKD stage II-III, hypertension, hypothyroidism presents with progressive failure to thrive, gait instability, frequent falls and dysuria. The patient was discharged from the emergency department on 10/24/2014 after being seen for dizziness and visual disturbance. MRI of the brain on 10/24/2014 was negative for acute findings. The patient was discharged back home. She returned with the above complaints, and was started on intravenous fluids and intravenous ceftriaxone for possible urinary tract infection. Family has noted the patient has had increasing confusion and falls. Patient was admitted with the physical therapy who recommended skilled nursing facility. The patient was treated with intravenous antibiotics which were converted to oral antibiotics at the time of discharge. At the time of discharge, the patient's mental status appeared to be at baseline. The patient was pleasant but confused but able to persist. In examination and follow directions. The patient will be discharged to a skilled nursing facility for a short period of time to allow the family time to make arrangements for ultimate transfer to a memory care unit pending patient's Medicaid approval    Discharge Exam: Filed Vitals:   10/28/14 0817  BP: 150/69  Pulse: 50  Temp: 98.7 F (37.1 C)  Resp: 16   Filed Vitals:   10/27/14 1624 10/27/14 2158 10/28/14 0500 10/28/14 0817  BP: 154/66 162/68 176/67 150/69  Pulse: 64 67 57 50  Temp: 98.3 F (36.8 C) 98.3 F (36.8 C) 97.5 F (36.4 C) 98.7 F (37.1 C)  TempSrc: Oral     Resp: 17 18 19 16   Weight:  84.369 kg (186 lb)    SpO2: 99% 99% 100% 100%   General: Awake and alert, NAD, pleasant, cooperative Cardiovascular: RRR, no rub, no gallop, no S3 Respiratory: CTAB, no wheeze, no rhonchi Abdomen:soft, nontender, nondistended, positive bowel sounds Extremities: No edema,  No lymphangitis, no petechiae  Discharge Instructions      Discharge Instructions     Diet - low sodium heart healthy    Complete by:  As directed      Increase activity slowly    Complete by:  As directed             Medication List    TAKE these medications        acetaminophen 325 MG tablet  Commonly known as:  TYLENOL  Take 650 mg by mouth every 6 (six) hours as needed for moderate pain.     albuterol 108 (90 BASE) MCG/ACT inhaler  Commonly known as:  PROVENTIL HFA;VENTOLIN HFA  Inhale 2 puffs into the lungs every 6 (six) hours as needed for wheezing or shortness of breath.     amoxicillin 500 MG capsule  Commonly known as:  AMOXIL  Take 1 capsule (500 mg total) by mouth 3 (three) times daily.     aspirin 81 MG tablet  Take 81 mg by mouth daily.     levothyroxine 50 MCG tablet  Commonly known as:  SYNTHROID, LEVOTHROID  TAKE 1 TABLET (50 MCG TOTAL) BY MOUTH DAILY.     LORazepam 1 MG tablet  Commonly known as:  ATIVAN  Take 0.5 mg by mouth every 8 (eight) hours as needed for anxiety or sleep.     losartan 50 MG tablet  Commonly known as:  COZAAR  Take 1 tablet (50 mg total) by mouth daily.     Magnesium 250 MG Tabs  Take 250 mg by mouth daily.         The results of significant diagnostics from this hospitalization (including imaging, microbiology, ancillary and laboratory) are listed below for reference.    Significant Diagnostic Studies: Dg Chest 2 View  10/26/2014   CLINICAL DATA:  Fatigue for 2 days  EXAM: CHEST  2 VIEW  COMPARISON:  10/24/2014  FINDINGS: Cardiac shadow is at the upper limits of normal in size. The lungs are clear bilaterally. No effusion is. Postsurgical changes are noted in the right shoulder. No acute bony abnormality is seen.  IMPRESSION: No acute abnormality noted.   Electronically Signed   By: Alcide Clever M.D.   On: 10/26/2014 09:42   Dg Chest 2 View  10/24/2014   CLINICAL DATA:  Dizziness. Blurred vision and headache for 1 day. History of hypertension.  EXAM: CHEST  2 VIEW  COMPARISON:  06/15/2014  FINDINGS:  Right worse than left glenohumeral joint osteoarthritis. Midline trachea. Mild cardiomegaly. Mediastinal contours otherwise within normal limits. No pleural effusion or pneumothorax. No congestive failure. Clear lungs.  IMPRESSION: Mild cardiomegaly, without acute disease.   Electronically Signed   By: Jeronimo Greaves M.D.   On: 10/24/2014 16:47   Mr Brain Wo Contrast  10/24/2014   CLINICAL DATA:  Dizziness and blurred vision and headache  EXAM: MRI HEAD WITHOUT CONTRAST  TECHNIQUE: Multiplanar, multiecho pulse sequences of the brain and surrounding structures were obtained without intravenous contrast.  COMPARISON:  CT head 07/08/2014.  MRI 10/03/2013  FINDINGS: Generalized atrophy which is stable. Negative for hydrocephalus. Chronic microvascular ischemic change in the white matter stable from the prior study  Negative for acute infarct. Negative for hemorrhage or mass lesion. No edema.  Bilateral lens replacement. No orbital mass. Paranasal sinuses clear. Pituitary normal in size.  IMPRESSION: Atrophy and chronic microvascular ischemia, expected for age  Negative for acute abnormality.   Electronically Signed   By: Leonette Most  Chestine Spore M.D.   On: 10/24/2014 19:35     Microbiology: Recent Results (from the past 240 hour(s))  Urine culture     Status: None   Collection Time: 10/24/14  5:40 PM  Result Value Ref Range Status   Specimen Description URINE, CLEAN CATCH  Final   Special Requests NONE  Final   Colony Count NO GROWTH Performed at [facility]   Final   Culture NO GROWTH Performed at [facility]   Final   Report Status 10/25/2014 FINAL  Final  Urine culture     Status: None   Collection Time: 10/26/14  8:51 AM  Result Value Ref Range Status   Specimen Description URINE, CLEAN CATCH  Final   Special Requests NONE  Final   Colony Count   Final    30,000 COLONIES/ML Performed at [facility]    Culture   Final    Multiple bacterial morphotypes present, none  predominant. Suggest appropriate recollection if clinically indicated. Performed at [facility]    Report Status 10/27/2014 FINAL  Final     Labs: Basic Metabolic Panel:  Recent Labs Lab 10/24/14 1351 10/26/14 0836 10/28/14 0424  NA 141 142 141  K 4.0 3.8 4.1  CL 107 107 110  CO2 26 26 25   GLUCOSE 129* 102* 103*  BUN 15 14 12   CREATININE 0.91 0.93 0.78  CALCIUM 9.0 9.0 8.8*   Liver Function Tests:  Recent Labs Lab 10/24/14 1351 10/26/14 0836  AST 17 15  ALT 15 15  ALKPHOS 74 72  BILITOT 0.5 0.8  PROT 6.5 6.3*  ALBUMIN 3.5 3.4*   No results for input(s): LIPASE, AMYLASE in the last 168 hours. No results for input(s): AMMONIA in the last 168 hours. CBC:  Recent Labs Lab 10/24/14 1351 10/26/14 0836  WBC 7.8 7.3  NEUTROABS 5.4 4.8  HGB 11.9* 11.8*  HCT 35.4* 34.8*  MCV 87.4 86.6  PLT 234 218   Cardiac Enzymes: No results for input(s): CKTOTAL, CKMB, CKMBINDEX, TROPONINI in the last 168 hours. BNP: Invalid input(s): POCBNP CBG: No results for input(s): GLUCAP in the last 168 hours.  Time coordinating discharge:  Greater than 30 minutes  Signed:  Kanen Mottola, DO [facility] Pager: [phone] 10/28/2014, 9:10 AM

## 2014-10-28 NOTE — Evaluation (Signed)
Late entry for 5/27 Occupational Therapy Evaluation-  Patient Details Name: [name] MRN: [id number] DOB: [birth year] Today's Date: 10/28/2014    History of Present Illness 81 y.o. female with h/o dementia and post-polio LLE (per family)  admitted with confusion, falls, FTT, possible UTI. Family would like SNF placement.   Clinical Impression   Pt admitted with confusion. Pt currently with functional limitations due to the deficits listed below (see OT Problem List).  Pt will benefit from skilled OT to increase their safety and independence with ADL and functional mobility for ADL to facilitate discharge to venue listed below.      Follow Up Recommendations  SNF    Equipment Recommendations  None recommended by OT    Recommendations for Other Services       Precautions / Restrictions Precautions Precautions: Fall      Mobility Bed Mobility Overal bed mobility: Needs Assistance Bed Mobility: Supine to Sit     Supine to sit: Min guard     General bed mobility comments: used rail  Transfers Overall transfer level: Needs assistance Equipment used: Rolling walker (2 wheeled) Transfers: Sit to/from Stand Sit to Stand: Min guard         General transfer comment: min/guard for safety due to recent falls    Balance     Sitting balance-Leahy Scale: Good       Standing balance-Leahy Scale: Fair                              ADL Overall ADL's : Needs assistance/impaired     Grooming: Standing;Wash/dry face;Min guard           Upper Body Dressing : Set up;Sitting   Lower Body Dressing: Minimal assistance;Sit to/from stand;Cueing for sequencing;Cueing for safety   Toilet Transfer: Minimal assistance;RW;BSC   Toileting- Clothing Manipulation and Hygiene: Minimal assistance;Cueing for safety       Functional mobility during ADLs: Rolling walker;Cueing for safety;Minimal assistance                 Pertinent Vitals/Pain Pain  Assessment: No/denies pain           Communication Communication Communication: No difficulties   Cognition Arousal/Alertness: Awake/alert Behavior During Therapy: WFL for tasks assessed/performed Overall Cognitive Status: History of cognitive impairments - at baseline                     General Comments   pt needs SNF or ALF- family working with SW on options            Home Living Family/patient expects to be discharged to:: Skilled nursing facility Living Arrangements: Alone Available Help at Discharge: Family;Available PRN/intermittently Type of Home: Independent living facility Home Access: Level entry     Home Layout: One level               Home Equipment: Walker - 2 wheels;Cane - single point;Shower seat   Additional Comments: son and DIL live in same facility but cannot provide 24* assist, they state pt hasn't been eating and doesn't take her medicine, and that she's had several falls recently, family wants SNF      Prior Functioning/Environment Level of Independence: Independent with assistive device(s)        Comments: uses cane in her apartment, but has had falls, hasn't used RW despite encouragement to do so from family and MD    OT Diagnosis: Generalized  weakness   OT Problem List: Decreased strength;Impaired balance (sitting and/or standing);Decreased activity tolerance;Decreased safety awareness;Decreased cognition   OT Treatment/Interventions: Self-care/ADL training    OT Goals(Current goals can be found in the care plan section)    OT Frequency: Min 2X/week   Barriers to D/C: Decreased caregiver support          Co-evaluation              End of Session Equipment Utilized During Treatment: Rolling walker Nurse Communication: Mobility status  Activity Tolerance: Patient tolerated treatment well Patient left: in bed;with call bell/phone within reach;with family/visitor present   [name], [place] 10/28/2014, 8:15  AM

## 2014-11-01 ENCOUNTER — Telehealth: Payer: Self-pay | Admitting: Internal Medicine

## 2014-11-01 DIAGNOSIS — F0390 Unspecified dementia without behavioral disturbance: Secondary | ICD-10-CM | POA: Diagnosis not present

## 2014-11-01 DIAGNOSIS — I10 Essential (primary) hypertension: Secondary | ICD-10-CM | POA: Diagnosis not present

## 2014-11-01 DIAGNOSIS — R296 Repeated falls: Secondary | ICD-10-CM | POA: Diagnosis not present

## 2014-11-01 DIAGNOSIS — M6281 Muscle weakness (generalized): Secondary | ICD-10-CM | POA: Diagnosis not present

## 2014-11-01 DIAGNOSIS — G9340 Encephalopathy, unspecified: Secondary | ICD-10-CM | POA: Diagnosis not present

## 2014-11-01 DIAGNOSIS — R278 Other lack of coordination: Secondary | ICD-10-CM | POA: Diagnosis not present

## 2014-11-01 DIAGNOSIS — R627 Adult failure to thrive: Secondary | ICD-10-CM | POA: Diagnosis not present

## 2014-11-01 DIAGNOSIS — R41841 Cognitive communication deficit: Secondary | ICD-10-CM | POA: Diagnosis not present

## 2014-11-01 NOTE — Telephone Encounter (Signed)
I spoke with Orson ApeJoanne Blake and informed her to schedule a hospital follow up visit with Dr. Dorise HissKollar when she is released from rehab.

## 2014-11-01 NOTE — Telephone Encounter (Signed)
States patient was in the hospital and then was [facility]  Family is requesting a call back once Dr. Dorise HissKollar gets back in to confirm that the facility is keeping in touch with Dr. Dorise HissKollar and to see what their next step needs to be with patient.

## 2014-11-17 DIAGNOSIS — R627 Adult failure to thrive: Secondary | ICD-10-CM | POA: Diagnosis not present

## 2014-11-17 DIAGNOSIS — R296 Repeated falls: Secondary | ICD-10-CM | POA: Diagnosis not present

## 2014-11-17 DIAGNOSIS — F419 Anxiety disorder, unspecified: Secondary | ICD-10-CM | POA: Diagnosis not present

## 2014-11-17 DIAGNOSIS — F0390 Unspecified dementia without behavioral disturbance: Secondary | ICD-10-CM | POA: Diagnosis not present

## 2014-11-17 DIAGNOSIS — F329 Major depressive disorder, single episode, unspecified: Secondary | ICD-10-CM | POA: Diagnosis not present

## 2014-11-20 ENCOUNTER — Telehealth: Payer: Self-pay | Admitting: Internal Medicine

## 2014-11-20 NOTE — Telephone Encounter (Signed)
Left message giving verbal orders.  

## 2014-11-20 NOTE — Telephone Encounter (Signed)
[name] [phone]  Need verbal Home health OT to address to home safety awareness and fall prevention

## 2014-11-21 DIAGNOSIS — R627 Adult failure to thrive: Secondary | ICD-10-CM | POA: Diagnosis not present

## 2014-11-21 DIAGNOSIS — F329 Major depressive disorder, single episode, unspecified: Secondary | ICD-10-CM | POA: Diagnosis not present

## 2014-11-21 DIAGNOSIS — F419 Anxiety disorder, unspecified: Secondary | ICD-10-CM | POA: Diagnosis not present

## 2014-11-21 DIAGNOSIS — F0390 Unspecified dementia without behavioral disturbance: Secondary | ICD-10-CM | POA: Diagnosis not present

## 2014-11-21 DIAGNOSIS — R296 Repeated falls: Secondary | ICD-10-CM | POA: Diagnosis not present

## 2014-11-23 ENCOUNTER — Telehealth: Payer: Self-pay | Admitting: Internal Medicine

## 2014-11-23 DIAGNOSIS — R627 Adult failure to thrive: Secondary | ICD-10-CM | POA: Diagnosis not present

## 2014-11-23 DIAGNOSIS — R296 Repeated falls: Secondary | ICD-10-CM | POA: Diagnosis not present

## 2014-11-23 DIAGNOSIS — F419 Anxiety disorder, unspecified: Secondary | ICD-10-CM | POA: Diagnosis not present

## 2014-11-23 DIAGNOSIS — F329 Major depressive disorder, single episode, unspecified: Secondary | ICD-10-CM | POA: Diagnosis not present

## 2014-11-23 DIAGNOSIS — F0390 Unspecified dementia without behavioral disturbance: Secondary | ICD-10-CM | POA: Diagnosis not present

## 2014-11-23 NOTE — Telephone Encounter (Signed)
[name] [phone]  Calling in for verbal order for Pt 1 week 1 2 week 4 Effective 6/17  Also wanting a Child psychotherapist and speech therapist  Pt is having Numbness in 2 of her fingers on the right hand, he was with her this morning (fyi)

## 2014-11-23 NOTE — Telephone Encounter (Signed)
Call and gave verbal orders.

## 2014-11-25 ENCOUNTER — Emergency Department (HOSPITAL_COMMUNITY): Payer: Commercial Managed Care - HMO

## 2014-11-25 ENCOUNTER — Encounter (HOSPITAL_COMMUNITY): Payer: Self-pay | Admitting: *Deleted

## 2014-11-25 ENCOUNTER — Inpatient Hospital Stay (HOSPITAL_COMMUNITY)
Admission: EM | Admit: 2014-11-25 | Discharge: 2014-11-26 | DRG: 558 | Disposition: A | Discharge status: Home / Self Care | Payer: Commercial Managed Care - HMO | Attending: Family Medicine | Admitting: Family Medicine

## 2014-11-25 DIAGNOSIS — Z7982 Long term (current) use of aspirin: Secondary | ICD-10-CM

## 2014-11-25 DIAGNOSIS — Z885 Allergy status to narcotic agent status: Secondary | ICD-10-CM | POA: Diagnosis not present

## 2014-11-25 DIAGNOSIS — I129 Hypertensive chronic kidney disease with stage 1 through stage 4 chronic kidney disease, or unspecified chronic kidney disease: Secondary | ICD-10-CM | POA: Diagnosis present

## 2014-11-25 DIAGNOSIS — F329 Major depressive disorder, single episode, unspecified: Secondary | ICD-10-CM | POA: Diagnosis not present

## 2014-11-25 DIAGNOSIS — D649 Anemia, unspecified: Secondary | ICD-10-CM | POA: Diagnosis present

## 2014-11-25 DIAGNOSIS — M7061 Trochanteric bursitis, right hip: Secondary | ICD-10-CM | POA: Diagnosis not present

## 2014-11-25 DIAGNOSIS — R296 Repeated falls: Secondary | ICD-10-CM | POA: Diagnosis not present

## 2014-11-25 DIAGNOSIS — G2581 Restless legs syndrome: Secondary | ICD-10-CM | POA: Diagnosis not present

## 2014-11-25 DIAGNOSIS — G8929 Other chronic pain: Secondary | ICD-10-CM | POA: Diagnosis not present

## 2014-11-25 DIAGNOSIS — R011 Cardiac murmur, unspecified: Secondary | ICD-10-CM | POA: Diagnosis present

## 2014-11-25 DIAGNOSIS — M1990 Unspecified osteoarthritis, unspecified site: Secondary | ICD-10-CM | POA: Diagnosis present

## 2014-11-25 DIAGNOSIS — Z86010 Personal history of colonic polyps: Secondary | ICD-10-CM | POA: Diagnosis not present

## 2014-11-25 DIAGNOSIS — F0390 Unspecified dementia without behavioral disturbance: Secondary | ICD-10-CM | POA: Diagnosis not present

## 2014-11-25 DIAGNOSIS — I10 Essential (primary) hypertension: Secondary | ICD-10-CM | POA: Diagnosis not present

## 2014-11-25 DIAGNOSIS — R410 Disorientation, unspecified: Secondary | ICD-10-CM

## 2014-11-25 DIAGNOSIS — Z9049 Acquired absence of other specified parts of digestive tract: Secondary | ICD-10-CM | POA: Diagnosis present

## 2014-11-25 DIAGNOSIS — Z79899 Other long term (current) drug therapy: Secondary | ICD-10-CM | POA: Diagnosis not present

## 2014-11-25 DIAGNOSIS — M1711 Unilateral primary osteoarthritis, right knee: Secondary | ICD-10-CM | POA: Diagnosis not present

## 2014-11-25 DIAGNOSIS — R200 Anesthesia of skin: Secondary | ICD-10-CM | POA: Diagnosis present

## 2014-11-25 DIAGNOSIS — Z792 Long term (current) use of antibiotics: Secondary | ICD-10-CM

## 2014-11-25 DIAGNOSIS — Z90710 Acquired absence of both cervix and uterus: Secondary | ICD-10-CM

## 2014-11-25 DIAGNOSIS — M79606 Pain in leg, unspecified: Secondary | ICD-10-CM | POA: Diagnosis present

## 2014-11-25 DIAGNOSIS — R627 Adult failure to thrive: Secondary | ICD-10-CM | POA: Diagnosis present

## 2014-11-25 DIAGNOSIS — N189 Chronic kidney disease, unspecified: Secondary | ICD-10-CM | POA: Diagnosis present

## 2014-11-25 DIAGNOSIS — S80211A Abrasion, right knee, initial encounter: Secondary | ICD-10-CM | POA: Diagnosis present

## 2014-11-25 DIAGNOSIS — M7060 Trochanteric bursitis, unspecified hip: Secondary | ICD-10-CM | POA: Diagnosis present

## 2014-11-25 DIAGNOSIS — E038 Other specified hypothyroidism: Secondary | ICD-10-CM | POA: Diagnosis not present

## 2014-11-25 DIAGNOSIS — F419 Anxiety disorder, unspecified: Secondary | ICD-10-CM | POA: Diagnosis present

## 2014-11-25 DIAGNOSIS — R4182 Altered mental status, unspecified: Secondary | ICD-10-CM

## 2014-11-25 DIAGNOSIS — S79911A Unspecified injury of right hip, initial encounter: Secondary | ICD-10-CM | POA: Diagnosis not present

## 2014-11-25 DIAGNOSIS — W19XXXA Unspecified fall, initial encounter: Secondary | ICD-10-CM | POA: Diagnosis present

## 2014-11-25 DIAGNOSIS — E039 Hypothyroidism, unspecified: Secondary | ICD-10-CM | POA: Diagnosis not present

## 2014-11-25 DIAGNOSIS — S8991XA Unspecified injury of right lower leg, initial encounter: Secondary | ICD-10-CM | POA: Diagnosis not present

## 2014-11-25 DIAGNOSIS — S70219A Abrasion, unspecified hip, initial encounter: Secondary | ICD-10-CM

## 2014-11-25 DIAGNOSIS — M25561 Pain in right knee: Secondary | ICD-10-CM | POA: Diagnosis present

## 2014-11-25 DIAGNOSIS — M25551 Pain in right hip: Secondary | ICD-10-CM | POA: Diagnosis not present

## 2014-11-25 DIAGNOSIS — R079 Chest pain, unspecified: Secondary | ICD-10-CM | POA: Diagnosis not present

## 2014-11-25 DIAGNOSIS — I38 Endocarditis, valve unspecified: Secondary | ICD-10-CM | POA: Diagnosis not present

## 2014-11-25 DIAGNOSIS — M7062 Trochanteric bursitis, left hip: Secondary | ICD-10-CM

## 2014-11-25 LAB — I-STAT TROPONIN, ED: Troponin i, poc: 0 ng/mL (ref 0.00–0.08)

## 2014-11-25 LAB — DIFFERENTIAL
Basophils Absolute: 0 10*3/uL (ref 0.0–0.1)
Basophils Relative: 0 % (ref 0–1)
EOS PCT: 1 % (ref 0–5)
Eosinophils Absolute: 0.1 10*3/uL (ref 0.0–0.7)
LYMPHS ABS: 1.8 10*3/uL (ref 0.7–4.0)
Lymphocytes Relative: 21 % (ref 12–46)
MONO ABS: 0.4 10*3/uL (ref 0.1–1.0)
MONOS PCT: 5 % (ref 3–12)
NEUTROS PCT: 73 % (ref 43–77)
Neutro Abs: 6.5 10*3/uL (ref 1.7–7.7)

## 2014-11-25 LAB — CBC
HCT: 36.2 % (ref 36.0–46.0)
Hemoglobin: 12.2 g/dL (ref 12.0–15.0)
MCH: 29.5 pg (ref 26.0–34.0)
MCHC: 33.7 g/dL (ref 30.0–36.0)
MCV: 87.7 fL (ref 78.0–100.0)
Platelets: 216 10*3/uL (ref 150–400)
RBC: 4.13 MIL/uL (ref 3.87–5.11)
RDW: 12.5 % (ref 11.5–15.5)
WBC: 8.9 10*3/uL (ref 4.0–10.5)

## 2014-11-25 LAB — COMPREHENSIVE METABOLIC PANEL
ALK PHOS: 77 U/L (ref 38–126)
ALT: 18 U/L (ref 14–54)
ANION GAP: 9 (ref 5–15)
AST: 19 U/L (ref 15–41)
Albumin: 3.4 g/dL — ABNORMAL LOW (ref 3.5–5.0)
BUN: 15 mg/dL (ref 6–20)
CHLORIDE: 104 mmol/L (ref 101–111)
CO2: 27 mmol/L (ref 22–32)
CREATININE: 1 mg/dL (ref 0.44–1.00)
Calcium: 9.1 mg/dL (ref 8.9–10.3)
GFR, EST AFRICAN AMERICAN: 55 mL/min — AB (ref >60)
GFR, EST NON AFRICAN AMERICAN: 47 mL/min — AB (ref >60)
Glucose, Bld: 134 mg/dL — ABNORMAL HIGH (ref 65–99)
POTASSIUM: 3.6 mmol/L (ref 3.5–5.1)
SODIUM: 140 mmol/L (ref 135–145)
TOTAL PROTEIN: 6.2 g/dL — AB (ref 6.5–8.1)
Total Bilirubin: 0.5 mg/dL (ref 0.3–1.2)

## 2014-11-25 LAB — URINALYSIS, ROUTINE W REFLEX MICROSCOPIC
Bilirubin Urine: NEGATIVE
GLUCOSE, UA: NEGATIVE mg/dL
Ketones, ur: NEGATIVE mg/dL
NITRITE: NEGATIVE
PH: 5.5 (ref 5.0–8.0)
PROTEIN: NEGATIVE mg/dL
Specific Gravity, Urine: 1.011 (ref 1.005–1.030)
Urobilinogen, UA: 0.2 mg/dL (ref 0.0–1.0)

## 2014-11-25 LAB — RAPID URINE DRUG SCREEN, HOSP PERFORMED
Amphetamines: NOT DETECTED
BARBITURATES: NOT DETECTED
BENZODIAZEPINES: NOT DETECTED
Cocaine: NOT DETECTED
Opiates: NOT DETECTED
Tetrahydrocannabinol: NOT DETECTED

## 2014-11-25 LAB — URINE MICROSCOPIC-ADD ON

## 2014-11-25 LAB — AMMONIA: Ammonia: 57 umol/L — ABNORMAL HIGH (ref 9–35)

## 2014-11-25 LAB — PROTIME-INR
INR: 1.05 (ref 0.00–1.49)
PROTHROMBIN TIME: 13.9 s (ref 11.6–15.2)

## 2014-11-25 MED ORDER — LORAZEPAM 0.5 MG PO TABS
0.5000 mg | ORAL_TABLET | Freq: Four times a day (QID) | ORAL | Status: DC | PRN
  Administered 2014-11-26: 0.5 mg via ORAL
  Filled 2014-11-25: qty 1

## 2014-11-25 MED ORDER — ASPIRIN EC 81 MG PO TBEC
81.0000 mg | DELAYED_RELEASE_TABLET | Freq: Every day | ORAL | Status: DC
  Filled 2014-11-25: qty 1

## 2014-11-25 MED ORDER — LEVOTHYROXINE SODIUM 50 MCG PO TABS
50.0000 ug | ORAL_TABLET | Freq: Every day | ORAL | Status: DC
  Filled 2014-11-25 (×2): qty 1

## 2014-11-25 MED ORDER — ACETAMINOPHEN 650 MG RE SUPP
650.0000 mg | Freq: Four times a day (QID) | RECTAL | Status: DC | PRN
Start: 2014-11-25 — End: 2014-11-26

## 2014-11-25 MED ORDER — BISACODYL 10 MG RE SUPP: 10.0000 mg | Freq: Every day | RECTAL | Status: DC | PRN

## 2014-11-25 MED ORDER — ACETAMINOPHEN 325 MG PO TABS
650.0000 mg | ORAL_TABLET | Freq: Four times a day (QID) | ORAL | Status: DC | PRN
Start: 2014-11-25 — End: 2014-11-26
  Administered 2014-11-26: 650 mg via ORAL
  Filled 2014-11-25: qty 2

## 2014-11-25 MED ORDER — DEXTROSE 5 % IV SOLN
1.0000 g | Freq: Once | INTRAVENOUS | Status: AC
  Administered 2014-11-25: 1 g via INTRAVENOUS
  Filled 2014-11-25: qty 10

## 2014-11-25 NOTE — ED Notes (Signed)
MD at the bedside  

## 2014-11-25 NOTE — ED Notes (Signed)
The pt may have fallen during the night maybe.  She lives alone in a independent living apartment and today the neighbor  Found that she was unable to walk and c/o of pain in  The rt hip thigh and rt knee and her rt  Arm.  The pt has dementia she does not know where she is

## 2014-11-25 NOTE — ED Notes (Signed)
MD made aware of the patient's finding neuro.

## 2014-11-25 NOTE — Progress Notes (Signed)
Report received from Dahlia Client, RN from ED. Awaiting pt's arrival.

## 2014-11-25 NOTE — H&P (Signed)
[facility] History and Physical  ELISSE PENNICK JWJ:[id number] DOB: 06-04-1920 DOA: 11/25/2014  Referring physician: Ethelle Lyon PCP: Judie Bonus, MD  Specialists: none   HPI:  81 y/o  CF-known history moderate to severe dementia with recent sudden decline, recent admission hospital 5/26-->5/28 metabolic encephalopathy, bacteria, progressive dementia, HTN, hypothyroid, heart murmur, restless leg syndrome, chronic left leg pain, history trochanteric bursitis [facility] emergency room 11/25/14 p.m. She was recently at [facility] skilled nursing facility for 2 weeks and then her benefits ran out she is getting PT/OT/SLP. Occupational therapy saw the patient over the past couple of weeks and saw her today but patient does not remember seeing them. She has multiple somatic complaints including right side of her body painting completely up from her foot all the way to her shoulder. She states that the majority of her pain however is in her right knee. She cannot tell me where she is "I don't know" she cannot tell me the date year month president or current season Most of the history is obtained by her daughter-in-law. It is documented somewhere that her 8 was told that she may fallen yesterday at the bathroom. There is a scab on the front of her knees well. It is noted on MRI of the brain that was done on last admission was negative for acute infarct   Her review of systems is entirely unreliable as she cannot really tell me what is going on with her  Her family tells me that she has progressively declined in terms of her memory.  Objective data Sodium 140, B arthroscopy 15/1.0, LFTs normal WC 0.9\ Glucose 134 Two-view chest x-ray this admission showed no acute findings, degenerative glenohumeral arthropathy Knee x-rays = no fractional dislocation Hip x-ray = CT head = no acute intracranial abnormality  Past Medical  History  Diagnosis Date  . Arthritis   . Chicken pox   . Chronic kidney disease   . Colon polyps   . Hypertension   . Dementia   . Angina   . Dysrhythmia   . Heart murmur   . Shortness of breath   . Recurrent upper respiratory infection (URI)   . Pneumonia   . Hypothyroidism   . Anemia   . Headache(784.0)   . Anxiety   . Depression   . Valvular heart disease 09/04/2012  . Left leg pain 10/02/2012  . UTI (urinary tract infection) 12/30/2012  . Acute bronchitis 04/10/2013  . Dehydration 08/31/2013  . Arthritis 10/02/2012   Past Surgical History  Procedure Laterality Date  . Appendectomy    . Cholecystectomy    . Kidney stones    . Tonsillectomy    . Abdominal hysterectomy    . Hand surgery    . Feet surgery    . Shoulder arthroscopy    . Back surgery    . Tonsillectomy    . Eye surgery      cataract removed and eye lids lifted  . Fracture surgery      bilateral arms  . Tubal ligation    . Colonoscopy w/ polypectomy     Social History:  History   Social History Narrative   Still lives at home and ambulatory daily. Doesn't use cane or walker, but has cane at home. Remote smoking history.     Allergies  Allergen Reactions  . Codeine Rash  . Hydrocodone Rash    Family History  Problem Relation Age of  Onset  . Heart disease Mother   . Heart disease Father   . Heart disease Other   . Birth defects Other     Prior to Admission medications   Medication Sig Start Date End Date Taking? Authorizing Provider  acetaminophen (TYLENOL) 325 MG tablet Take 650 mg by mouth every 6 (six) hours as needed for moderate pain.    Historical Provider, MD  albuterol (PROVENTIL HFA;VENTOLIN HFA) 108 (90 BASE) MCG/ACT inhaler Inhale 2 puffs into the lungs every 6 (six) hours as needed for wheezing or shortness of breath. 09/23/13   Sandford Craze, NP  amoxicillin (AMOXIL) 500 MG capsule Take 1 capsule (500 mg total) by mouth 3 (three) times daily. 10/28/14   Catarina Hartshorn, MD  aspirin 81  MG tablet Take 81 mg by mouth daily.    Historical Provider, MD  levothyroxine (SYNTHROID, LEVOTHROID) 50 MCG tablet TAKE 1 TABLET (50 MCG TOTAL) BY MOUTH DAILY. 09/25/14   Judie Bonus, MD  LORazepam (ATIVAN) 0.5 MG tablet Take 0.5 mg by mouth. 11/16/14   Historical Provider, MD  LORazepam (ATIVAN) 1 MG tablet Take 0.5 mg by mouth every 8 (eight) hours as needed for anxiety or sleep.     Historical Provider, MD  losartan (COZAAR) 50 MG tablet Take 1 tablet (50 mg total) by mouth daily. 10/28/14   Catarina Hartshorn, MD  Magnesium 250 MG TABS Take 250 mg by mouth daily.     Historical Provider, MD   Physical Exam: Filed Vitals:   11/25/14 2047 11/25/14 2115 11/25/14 2130 11/25/14 2200  BP:  125/60 108/83 141/55  Pulse:  71 71 66  Temp: 97.8 F (36.6 C)     TempSrc: Rectal     Resp:  21 15 17   Height:      Weight:      SpO2:  92% 99% 96%    Frail tearful alert oriented to person only Scleral icterus present EOMI NCAT No lymphadenopathy No JVD Holosystolic murmur 2/6 Abdomen soft nontender nondistended no rebound or guarding Straight-leg raising test is somewhat painful, she does have pain over the lateral aspect of the right hip There is an abrasion on the front of her knee the knee is nontender nonswollen and nonerythematous and does not have any fluid in it. I deferred further  manual muscle testing such as McMurray's test and other testing as she seemed to have nonspecific pain to minimal intervention  Labs on Admission:  Basic Metabolic Panel:  Recent Labs Lab 11/25/14 1842  NA 140  K 3.6  CL 104  CO2 27  GLUCOSE 134*  BUN 15  CREATININE 1.00  CALCIUM 9.1   Liver Function Tests:  Recent Labs Lab 11/25/14 1842  AST 19  ALT 18  ALKPHOS 77  BILITOT 0.5  PROT 6.2*  ALBUMIN 3.4*   No results for input(s): LIPASE, AMYLASE in the last 168 hours. No results for input(s): AMMONIA in the last 168 hours. CBC:  Recent Labs Lab 11/25/14 1842  WBC 8.9  NEUTROABS 6.5   HGB 12.2  HCT 36.2  MCV 87.7  PLT 216   Cardiac Enzymes: No results for input(s): CKTOTAL, CKMB, CKMBINDEX, TROPONINI in the last 168 hours.  BNP (last 3 results) No results for input(s): BNP in the last 8760 hours.  ProBNP (last 3 results) No results for input(s): PROBNP in the last 8760 hours.  CBG: No results for input(s): GLUCAP in the last 168 hours.  Radiological Exams on Admission: Dg Chest 2 View  11/25/2014   CLINICAL DATA:  Left chest pain.  Fall.  Dementia.  EXAM: CHEST  2 VIEW  COMPARISON:  10/26/2014  FINDINGS: The The degenerative glenohumeral arthropathy bilaterally. Heart size within normal limits.  Thoracic spondylosis.  No airspace opacity identified.  No pleural effusion noted.  IMPRESSION: 1. No pneumothorax, pleural effusion, or acute thoracic findings. Please note that nondisplaced rib fractures can be occult on conventional radiography. 2. Degenerative glenohumeral arthropathy bilaterally.   Electronically Signed   By: Gaylyn Rong M.D.   On: 11/25/2014 19:40   Ct Head Wo Contrast  11/25/2014   CLINICAL DATA:  Right-sided numbness  EXAM: CT HEAD WITHOUT CONTRAST  TECHNIQUE: Contiguous axial images were obtained from the base of the skull through the vertex without intravenous contrast.  COMPARISON:  MRI brain dated 10/24/2014  FINDINGS: No evidence of parenchymal hemorrhage or extra-axial fluid collection. No mass lesion, mass effect, or midline shift.  No CT evidence of acute infarction.  Subcortical white matter and periventricular small vessel ischemic changes. Mild intracranial atherosclerosis.  Global cortical atrophy.  No ventriculomegaly.  The visualized paranasal sinuses are essentially clear. The mastoid air cells are unopacified.  No evidence of calvarial fracture.  IMPRESSION: No evidence of acute intracranial abnormality.  Atrophy with small vessel ischemic changes.   Electronically Signed   By: Charline Bills M.D.   On: 11/25/2014 20:12   Dg Knee  Complete 4 Views Right  11/25/2014   CLINICAL DATA:  Fall, dementia, right hip pain  EXAM: RIGHT KNEE - COMPLETE 4+ VIEW  COMPARISON:  None.  FINDINGS: No fracture or dislocation is seen.  Moderate tricompartmental degenerative changes with chondrocalcinosis.  Visualized soft tissues are within normal limits.  No suprapatellar knee joint effusion.  IMPRESSION: No fracture or dislocation is seen.  Moderate degenerative changes.   Electronically Signed   By: Charline Bills M.D.   On: 11/25/2014 19:41   Dg Hip Unilat With Pelvis 2-3 Views Right  11/25/2014   CLINICAL DATA:  Fall.  Dementia.  Right hip pain.  EXAM: RIGHT HIP (WITH PELVIS) 2-3 VIEWS  COMPARISON:  None.  FINDINGS: Bilateral degenerative arthropathy of the hips with spurring and mild loss of articular space.  Bony demineralization.  No fracture or acute bony findings identified.  IMPRESSION: 1. No fracture or acute bony findings identified. 2. Bony demineralization. 3. Mild degenerative arthropathy of both hips.   Electronically Signed   By: Gaylyn Rong M.D.   On: 11/25/2014 19:41    EKG: Independently reviewed. Sinus rhythm PR interval borderline first-degree AV block 0.20 QRS axis left anterior fascicular block -60 ST-T wave abnormalities across precordium but no STEMI, low voltage, [place]  Assessment/Plan Principal Problem:   Leg pain-her leg x-ray is not suggestive of any acute finding in the hip the knee. I suspect that she had an abrasion but also may have trochanteric bursitis. I will give her a burst of prednisone and this may help with this. We will observe her OVERNIGHT and evaluate her with physical therapy in the morning I had a long discussion with her son who is her healthcare power of attorney and executor and he tells me that with her progressive decline over the past couple of months, he would not want any aggressive measures and recommends DO NOT RESUSCITATE for her. Patient may be able to return to assisted living  facility with hospice following the patient if there is further decline moving forward We will ask her son in the next couple  days if you would like her hospitalized in the future if this happens again   ? Pyelonephritis urinalysis obtained was clean catch and therefore is contaminated I've asked the ED to obtain catheterized specimen. I will hold off on antibiotics as patient has no white count has no evidence of SIRS or active infection Active Problems:   Hypertension-patient can continue losartan at the dose 50 mg   Hypothyroidism -continue Synthroid 50 g daily   Trochanteric bursitis-we will give steroid-induced burst of 40 mg daily prednisone and see if this helps. I would consider orthopedic input versus injection into the trochanteric bursa for relief.   Restless leg syndrome-curiously is not on any ropinirole or any other medications for this. I suspect that she has moderate dementia placed and this   Moderate to severe dementia-patient cannot tell me multiple orienting questions and the first 5 questions of the MMSE agent cannot answer. I suspect she may have some geriatric depression which may have overlay on this as well but she is already on Ativan which is known to be neurotoxic. I would recommend that skilled facility/assisted living delineate her medications carefully   Valvular heart disease   FTT (failure to thrive) in adult-patient has a poor overall prognosis if she does not improve. I discussed this extensively with the son.   Altered mental state  55 minutes Long discussion with son and healthcare power of attorney at bedside. If patient does not make improvement over next couple days. May elect to discharge her back to skilled facility/ALF with hospice following Admitted to MedSurg   Mahala Menghini [facility] Pager [phone]  If 7PM-7AM, please contact night-coverage [email or web address] Password [facility] 11/25/2014, 10:42 PM

## 2014-11-25 NOTE — ED Notes (Signed)
Attempted Report x1.   

## 2014-11-25 NOTE — ED Provider Notes (Signed)
CSN: [id number]     Arrival date & time 11/25/14  1731 History   First MD Initiated Contact with Patient 11/25/14 1752     Chief Complaint  Patient presents with  . Code Stroke     (Consider location/radiation/quality/duration/timing/severity/associated sxs/prior Treatment) Patient is a 81 y.o. female presenting with altered mental status. The history is provided by the patient. No language interpreter was used.  Altered Mental Status Presenting symptoms: behavior changes, confusion and lethargy   Severity:  Severe Most recent episode:  Today Episode history:  Single Timing:  Constant Progression:  Worsening Chronicity:  New Associated symptoms: no abdominal pain, no agitation, no fever, no headaches, no nausea, no palpitations, no vomiting and no weakness   Associated symptoms comment:  Right-sided numbness, right hip and right leg pain   Past Medical History  Diagnosis Date  . Arthritis   . Chicken pox   . Chronic kidney disease   . Colon polyps   . Hypertension   . Dementia   . Angina   . Dysrhythmia   . Heart murmur   . Shortness of breath   . Recurrent upper respiratory infection (URI)   . Pneumonia   . Hypothyroidism   . Anemia   . Headache(784.0)   . Anxiety   . Depression   . Valvular heart disease 09/04/2012  . Left leg pain 10/02/2012  . UTI (urinary tract infection) 12/30/2012  . Acute bronchitis 04/10/2013  . Dehydration 08/31/2013  . Arthritis 10/02/2012   Past Surgical History  Procedure Laterality Date  . Appendectomy    . Cholecystectomy    . Kidney stones    . Tonsillectomy    . Abdominal hysterectomy    . Hand surgery    . Feet surgery    . Shoulder arthroscopy    . Back surgery    . Tonsillectomy    . Eye surgery      cataract removed and eye lids lifted  . Fracture surgery      bilateral arms  . Tubal ligation    . Colonoscopy w/ polypectomy     Family History  Problem Relation Age of Onset  . Heart disease Mother   . Heart disease  Father   . Heart disease Other   . Birth defects Other    History  Substance Use Topics  . Smoking status: Former Smoker -- 0.20 packs/day for 1 years    Types: Cigarettes    Quit date: 06/02/1941  . Smokeless tobacco: Never Used  . Alcohol Use: No   OB History    No data available     Review of Systems  Constitutional: Negative for fever, chills, diaphoresis, activity change, appetite change and fatigue.  HENT: Negative for congestion, facial swelling, rhinorrhea and sore throat.   Eyes: Negative for photophobia and discharge.  Respiratory: Negative for cough, chest tightness and shortness of breath.   Cardiovascular: Negative for chest pain, palpitations and leg swelling.  Gastrointestinal: Negative for nausea, vomiting, abdominal pain and diarrhea.  Endocrine: Negative for polydipsia and polyuria.  Genitourinary: Negative for dysuria, frequency, difficulty urinating and pelvic pain.  Musculoskeletal: Positive for arthralgias. Negative for back pain, neck pain and neck stiffness.  Skin: Negative for color change and wound.  Allergic/Immunologic: Negative for immunocompromised state.  Neurological: Positive for numbness. Negative for facial asymmetry, weakness and headaches.  Hematological: Does not bruise/bleed easily.  Psychiatric/Behavioral: Positive for confusion. Negative for agitation.      Allergies  Codeine and Hydrocodone  Home Medications   Prior to Admission medications   Medication Sig Start Date End Date Taking? Authorizing Provider  acetaminophen (TYLENOL) 325 MG tablet Take 650 mg by mouth every 6 (six) hours as needed for moderate pain.    Historical Provider, MD  albuterol (PROVENTIL HFA;VENTOLIN HFA) 108 (90 BASE) MCG/ACT inhaler Inhale 2 puffs into the lungs every 6 (six) hours as needed for wheezing or shortness of breath. 09/23/13   Sandford Craze, NP  amoxicillin (AMOXIL) 500 MG capsule Take 1 capsule (500 mg total) by mouth 3 (three) times daily.  10/28/14   Catarina Hartshorn, MD  aspirin 81 MG tablet Take 81 mg by mouth daily.    Historical Provider, MD  lactulose (CHRONULAC) 10 GM/15ML solution Take 30 mLs (20 g total) by mouth 2 (two) times daily. 11/26/14   Rhetta Mura, MD  levothyroxine (SYNTHROID, LEVOTHROID) 50 MCG tablet TAKE 1 TABLET (50 MCG TOTAL) BY MOUTH DAILY. 09/25/14   Judie Bonus, MD  LORazepam (ATIVAN) 0.5 MG tablet Take 0.5 mg by mouth. 11/16/14   Historical Provider, MD  LORazepam (ATIVAN) 1 MG tablet Take 0.5 mg by mouth every 8 (eight) hours as needed for anxiety or sleep.     Historical Provider, MD  losartan (COZAAR) 50 MG tablet Take 1 tablet (50 mg total) by mouth daily. 10/28/14   Catarina Hartshorn, MD  Magnesium 250 MG TABS Take 250 mg by mouth daily.     Historical Provider, MD   BP 142/71 mmHg  Pulse 54  Temp(Src) 98.5 F (36.9 C) (Oral)  Resp 18  Ht 5\' 5"  (1.651 m)  Wt 175 lb 4.3 oz (79.5 kg)  BMI 29.17 kg/m2  SpO2 97% Physical Exam  Constitutional: She is oriented to person, place, and time. She appears well-developed and well-nourished. No distress.  HENT:  Head: Normocephalic and atraumatic.  Mouth/Throat: No oropharyngeal exudate.  Eyes: Pupils are equal, round, and reactive to light.  Neck: Normal range of motion. Neck supple.  Cardiovascular: Normal rate, regular rhythm and normal heart sounds.  Exam reveals no gallop and no friction rub.   No murmur heard. Pulmonary/Chest: Effort normal and breath sounds normal. No respiratory distress. She has no wheezes. She has no rales.  Abdominal: Soft. Bowel sounds are normal. She exhibits no distension and no mass. There is no tenderness. There is no rebound and no guarding.  Musculoskeletal: Normal range of motion. She exhibits no edema or tenderness.       Legs: Neurological: She is alert and oriented to person, place, and time. A sensory deficit is present. No cranial nerve deficit. Coordination normal. GCS eye subscore is 4. GCS verbal subscore is 4. GCS  motor subscore is 6.  Patient states her right side "feels bad" .  She cannot qualify this means.  She has decreased sensation.  Her strength is 4 out of 5 throughout.   Skin: Skin is warm and dry.  Psychiatric: She has a normal mood and affect.    ED Course  Procedures (including critical care time) Labs Review Labs Reviewed  COMPREHENSIVE METABOLIC PANEL - Abnormal; Notable for the following:    Glucose, Bld 134 (*)    Total Protein 6.2 (*)    Albumin 3.4 (*)    GFR calc non Af Amer 47 (*)    GFR calc Af Amer 55 (*)    All other components within normal limits  URINALYSIS, ROUTINE W REFLEX MICROSCOPIC (NOT AT [facility]) - Abnormal; Notable for the following:  APPearance CLOUDY (*)    Hgb urine dipstick TRACE (*)    Leukocytes, UA LARGE (*)    All other components within normal limits  URINE MICROSCOPIC-ADD ON - Abnormal; Notable for the following:    Squamous Epithelial / LPF MANY (*)    Bacteria, UA FEW (*)    All other components within normal limits  URINALYSIS W MICROSCOPIC - Abnormal; Notable for the following:    APPearance CLOUDY (*)    Leukocytes, UA SMALL (*)    All other components within normal limits  AMMONIA - Abnormal; Notable for the following:    Ammonia 57 (*)    All other components within normal limits  COMPREHENSIVE METABOLIC PANEL - Abnormal; Notable for the following:    Glucose, Bld 110 (*)    GFR calc non Af Amer 55 (*)    All other components within normal limits  PROTIME-INR  CBC  DIFFERENTIAL  URINE RAPID DRUG SCREEN, HOSP PERFORMED  CBC  I-STAT TROPOININ, ED  Rosezena Sensor, ED    Imaging Review Dg Chest 2 View  11/25/2014   CLINICAL DATA:  Left chest pain.  Fall.  Dementia.  EXAM: CHEST  2 VIEW  COMPARISON:  10/26/2014  FINDINGS: The The degenerative glenohumeral arthropathy bilaterally. Heart size within normal limits.  Thoracic spondylosis.  No airspace opacity identified.  No pleural effusion noted.  IMPRESSION: 1. No pneumothorax,  pleural effusion, or acute thoracic findings. Please note that nondisplaced rib fractures can be occult on conventional radiography. 2. Degenerative glenohumeral arthropathy bilaterally.   Electronically Signed   By: Gaylyn Rong M.D.   On: 11/25/2014 19:40   Ct Head Wo Contrast  11/25/2014   CLINICAL DATA:  Right-sided numbness  EXAM: CT HEAD WITHOUT CONTRAST  TECHNIQUE: Contiguous axial images were obtained from the base of the skull through the vertex without intravenous contrast.  COMPARISON:  MRI brain dated 10/24/2014  FINDINGS: No evidence of parenchymal hemorrhage or extra-axial fluid collection. No mass lesion, mass effect, or midline shift.  No CT evidence of acute infarction.  Subcortical white matter and periventricular small vessel ischemic changes. Mild intracranial atherosclerosis.  Global cortical atrophy.  No ventriculomegaly.  The visualized paranasal sinuses are essentially clear. The mastoid air cells are unopacified.  No evidence of calvarial fracture.  IMPRESSION: No evidence of acute intracranial abnormality.  Atrophy with small vessel ischemic changes.   Electronically Signed   By: Charline Bills M.D.   On: 11/25/2014 20:12   Dg Knee Complete 4 Views Right  11/25/2014   CLINICAL DATA:  Fall, dementia, right hip pain  EXAM: RIGHT KNEE - COMPLETE 4+ VIEW  COMPARISON:  None.  FINDINGS: No fracture or dislocation is seen.  Moderate tricompartmental degenerative changes with chondrocalcinosis.  Visualized soft tissues are within normal limits.  No suprapatellar knee joint effusion.  IMPRESSION: No fracture or dislocation is seen.  Moderate degenerative changes.   Electronically Signed   By: Charline Bills M.D.   On: 11/25/2014 19:41   Dg Hip Unilat With Pelvis 2-3 Views Right  11/25/2014   CLINICAL DATA:  Fall.  Dementia.  Right hip pain.  EXAM: RIGHT HIP (WITH PELVIS) 2-3 VIEWS  COMPARISON:  None.  FINDINGS: Bilateral degenerative arthropathy of the hips with spurring and  mild loss of articular space.  Bony demineralization.  No fracture or acute bony findings identified.  IMPRESSION: 1. No fracture or acute bony findings identified. 2. Bony demineralization. 3. Mild degenerative arthropathy of both hips.   Electronically  Signed   By: Gaylyn Rong M.D.   On: 11/25/2014 19:41     EKG Interpretation None      MDM   Final diagnoses:  Right hip pain  Altered mental status, unspecified altered mental status type    Pt is a 81 y.o. female with Pmhx as above who presents with gradual onset worsening confusion over the course of the day, as well as complaints of right sided numbness, right hip and right knee pain.  Family is concerned that she could have fallen during the night in her independent living facility.  On physical exam, vital signs are stable and she is in no acute distress.  She states that on palpation in her right side including face, arm and leg "feel bad.".  She is generalized decreased strength, though no focal weakness.  She is tenderness palpation of her right hip, right knee, and right shin.  She has a small abrasion to right knee, no other acute traumatic findings.   CT head w/o acute changes, No fx seen on XR chrs, R hip, R knee. Could possibly consider "bad feeling" on right side a sensation change, though is difficult to access in pt's current state.  UA infected vs contaminated, will repeat with in/out specimen. IV rocephin given. [facility] consulted and will admit for further w/u.    Toy Cookey, MD 11/26/14 [phone]

## 2014-11-26 DIAGNOSIS — I10 Essential (primary) hypertension: Secondary | ICD-10-CM

## 2014-11-26 LAB — URINALYSIS W MICROSCOPIC (NOT AT ARMC)
Bilirubin Urine: NEGATIVE
GLUCOSE, UA: NEGATIVE mg/dL
Hgb urine dipstick: NEGATIVE
KETONES UR: NEGATIVE mg/dL
Nitrite: NEGATIVE
PH: 6 (ref 5.0–8.0)
PROTEIN: NEGATIVE mg/dL
SPECIFIC GRAVITY, URINE: 1.011 (ref 1.005–1.030)
Urobilinogen, UA: 0.2 mg/dL (ref 0.0–1.0)

## 2014-11-26 LAB — CBC
HEMATOCRIT: 36.6 % (ref 36.0–46.0)
HEMOGLOBIN: 12.2 g/dL (ref 12.0–15.0)
MCH: 29 pg (ref 26.0–34.0)
MCHC: 33.3 g/dL (ref 30.0–36.0)
MCV: 86.9 fL (ref 78.0–100.0)
PLATELETS: 217 10*3/uL (ref 150–400)
RBC: 4.21 MIL/uL (ref 3.87–5.11)
RDW: 12.4 % (ref 11.5–15.5)
WBC: 8.6 10*3/uL (ref 4.0–10.5)

## 2014-11-26 LAB — COMPREHENSIVE METABOLIC PANEL
ALBUMIN: 3.5 g/dL (ref 3.5–5.0)
ALT: 19 U/L (ref 14–54)
AST: 18 U/L (ref 15–41)
Alkaline Phosphatase: 84 U/L (ref 38–126)
Anion gap: 7 (ref 5–15)
BUN: 12 mg/dL (ref 6–20)
CO2: 28 mmol/L (ref 22–32)
Calcium: 9.1 mg/dL (ref 8.9–10.3)
Chloride: 107 mmol/L (ref 101–111)
Creatinine, Ser: 0.89 mg/dL (ref 0.44–1.00)
GFR calc non Af Amer: 55 mL/min — ABNORMAL LOW (ref >60)
Glucose, Bld: 110 mg/dL — ABNORMAL HIGH (ref 65–99)
POTASSIUM: 3.9 mmol/L (ref 3.5–5.1)
Sodium: 142 mmol/L (ref 135–145)
Total Bilirubin: 0.7 mg/dL (ref 0.3–1.2)
Total Protein: 6.6 g/dL (ref 6.5–8.1)

## 2014-11-26 MED ORDER — LACTULOSE 10 GM/15ML PO SOLN
20.0000 g | Freq: Two times a day (BID) | ORAL | Status: DC
  Filled 2014-11-26 (×2): qty 30

## 2014-11-26 MED ORDER — CETYLPYRIDINIUM CHLORIDE 0.05 % MT LIQD: 7.0000 mL | Freq: Two times a day (BID) | OROMUCOSAL | Status: DC

## 2014-11-26 MED ORDER — SODIUM CHLORIDE 0.9 % IV SOLN: INTRAVENOUS | Status: AC

## 2014-11-26 MED ORDER — HEPARIN SODIUM (PORCINE) 5000 UNIT/ML IJ SOLN
5000.0000 [IU] | Freq: Three times a day (TID) | INTRAMUSCULAR | Status: DC
  Filled 2014-11-26 (×3): qty 1

## 2014-11-26 MED ORDER — LORAZEPAM 2 MG/ML IJ SOLN
INTRAMUSCULAR | Status: AC
  Administered 2014-11-26: 0.5 mg
  Filled 2014-11-26: qty 1

## 2014-11-26 MED ORDER — LORAZEPAM 2 MG/ML IJ SOLN: 2.5000 mg | Freq: Once | INTRAMUSCULAR | Status: DC

## 2014-11-26 MED ORDER — LORAZEPAM 2 MG/ML IJ SOLN
INTRAMUSCULAR | Status: AC
  Administered 2014-11-26: 1 mg
  Filled 2014-11-26: qty 1

## 2014-11-26 MED ORDER — SODIUM CHLORIDE 0.9 % IV SOLN
INTRAVENOUS | Status: DC
  Administered 2014-11-26: 1000 mL via INTRAVENOUS

## 2014-11-26 MED ORDER — LACTULOSE 10 GM/15ML PO SOLN: 20.0000 g | Freq: Two times a day (BID) | ORAL | Status: DC

## 2014-11-26 NOTE — Progress Notes (Addendum)
1324: Patient son called LCSW back reports he was at church and he will be eating lunch and then up to get his mother/patient. Reports he is interested in hospice for patient at this time. Reports she has been living in her IPL apartment with son and his wife right beside her.  Patient involved with [facility] as well.  Son to be here between 230 and 3pm for DC.  LCSW aware of patient and has worked with patient in Emergency Room several times. Patient prior to admission is inconclusive as notes suggest she went to SNF, but other notes state Independent Living. LCSW has called Leonette Most, patient's POA with more information.  Message left for son. Patient's daughter in law also involved in patient's care, lives with patient son and if patient still living in IPL apartment, they live right next door.  Patient agitated, attempting to leave unit. Staff working to deescalate patient while family trying to be reached.  SNF is the recommendation, however SNF has not been able to be obtained as insurance/payer was [facility] and they would not approve as patient can work, she just has memory problems and needs supervision/custodal care.  LCSW still working to locate family and define disposition. Aware of DC order.  Deretha Emory, MSW Clinical Social Work: Emergency Room [phone]

## 2014-11-26 NOTE — Discharge Summary (Signed)
Physician Discharge Summary  [name] BSJ:[id number] DOB: 02/12/1921 DOA: 11/25/2014  PCP: Olga Millers, MD  Admit date: 11/25/2014 Discharge date: 11/26/2014  Time spent: 35 minutes  Recommendations for Outpatient Follow-up:  1. Needs lactulose daily 2. Consider OP [place] abd pelvis to rule out liver pathology  3. Returning to [facility]F with SLP/PT/OT to follow 4. Get CMEt and CBC in 1 week 5. Needs Ger-pysch input and MMSE/Geri Depression scale as OP at faciltiy   Discharge Diagnoses:  Principal Problem:   Leg pain Active Problems:   Hypertension   Hypothyroidism   Trochanteric bursitis   Restless leg syndrome   Valvular heart disease   FTT (failure to thrive) in adult   Altered mental state   Discharge Condition: gaurded  Diet recommendation:  Regular  Filed Weights   11/25/14 1735 11/26/14 0005  Weight: 81.647 kg (180 lb) 79.5 kg (175 lb 4.3 oz)    History of present illness:  81 y/o CF-known history moderate to severe dementia with recent sudden decline, recent admission hospital 7/65-->4/65 metabolic encephalopathy, bacteria, progressive dementia, HTN, hypothyroid, heart murmur, restless leg syndrome, chronic left leg pain, history trochanteric bursitis [facility] emergency room 11/25/14 p.m. She was recently at [facility] facility for 2 weeks and then her benefits ran out she is getting PT/OT/SLP. Occupational therapy saw the patient over the past couple of weeks and saw her today but patient does not remember seeing them. She has multiple somatic complaints including right side of her body painting completely up from her foot all the way to her shoulder. She states that the majority of her pain however is in her right knee.  Extensive Radiological work-up revealed no specific # anywhere in knee, hip or back on admission on the R side of her body She remained pleasantly  confused and her ammonia was elevated  Her mentation however seemed to improve independent of Lactulose administration-she was anxious and scared and couldn't orient to place The patientwas felt to have met clinical and metabolic parameters for safe discharge on 11/26/14 and will be discharged 11/26/14   Discharge Exam: Filed Vitals:   11/26/14 0524  BP: 142/71  Pulse: 54  Temp: 98.5 F (36.9 C)  Resp: 18    General: alert anxious Cardiovascular: s1 s 2no m/r/g Respiratory: clear  Discharge Instructions   Discharge Instructions    Diet - low sodium heart healthy    Complete by:  As directed      Discharge instructions    Complete by:  As directed   Cut down on anxiolytics as OP Need Lactulose scheduled as op and potentitial circumscribed work-up for NASH?     Increase activity slowly    Complete by:  As directed           Current Discharge Medication List    START taking these medications   Details  lactulose (CHRONULAC) 10 GM/15ML solution Take 30 mLs (20 g total) by mouth 2 (two) times daily. Qty: 240 mL, Refills: 0      CONTINUE these medications which have NOT CHANGED   Details  albuterol (PROVENTIL HFA;VENTOLIN HFA) 108 (90 BASE) MCG/ACT inhaler Inhale 2 puffs into the lungs every 6 (six) hours as needed for wheezing or shortness of breath. Qty: 1 Inhaler, Refills: 0    aspirin 81 MG tablet Take 81 mg by mouth daily.    levothyroxine (SYNTHROID, LEVOTHROID) 50 MCG tablet TAKE 1  TABLET (50 MCG TOTAL) BY MOUTH DAILY. Qty: 30 tablet, Refills: 3    !! LORazepam (ATIVAN) 0.5 MG tablet Take 0.5 mg by mouth.    !! LORazepam (ATIVAN) 1 MG tablet Take 0.5 mg by mouth every 8 (eight) hours as needed for anxiety or sleep.     losartan (COZAAR) 50 MG tablet Take 1 tablet (50 mg total) by mouth daily. Qty: 30 tablet, Refills: 0    Magnesium 250 MG TABS Take 250 mg by mouth daily.      !! - Potential duplicate medications found. Please discuss with provider.     STOP taking these medications     acetaminophen (TYLENOL) 325 MG tablet      amoxicillin (AMOXIL) 500 MG capsule        Allergies  Allergen Reactions  . Codeine Rash  . Hydrocodone Rash      The results of significant diagnostics from this hospitalization (including imaging, microbiology, ancillary and laboratory) are listed below for reference.    Significant Diagnostic Studies: Dg Chest 2 View  11/25/2014   CLINICAL DATA:  Left chest pain.  Fall.  Dementia.  EXAM: CHEST  2 VIEW  COMPARISON:  10/26/2014  FINDINGS: The The degenerative glenohumeral arthropathy bilaterally. Heart size within normal limits.  Thoracic spondylosis.  No airspace opacity identified.  No pleural effusion noted.  IMPRESSION: 1. No pneumothorax, pleural effusion, or acute thoracic findings. Please note that nondisplaced rib fractures can be occult on conventional radiography. 2. Degenerative glenohumeral arthropathy bilaterally.   Electronically Signed   By: Van Clines M.D.   On: 11/25/2014 19:40   Ct Head Wo Contrast  11/25/2014   CLINICAL DATA:  Right-sided numbness  EXAM: CT HEAD WITHOUT CONTRAST  TECHNIQUE: Contiguous axial images were obtained from the base of the skull through the vertex without intravenous contrast.  COMPARISON:  MRI brain dated 10/24/2014  FINDINGS: No evidence of parenchymal hemorrhage or extra-axial fluid collection. No mass lesion, mass effect, or midline shift.  No CT evidence of acute infarction.  Subcortical white matter and periventricular small vessel ischemic changes. Mild intracranial atherosclerosis.  Global cortical atrophy.  No ventriculomegaly.  The visualized paranasal sinuses are essentially clear. The mastoid air cells are unopacified.  No evidence of calvarial fracture.  IMPRESSION: No evidence of acute intracranial abnormality.  Atrophy with small vessel ischemic changes.   Electronically Signed   By: Julian Hy M.D.   On: 11/25/2014 20:12   Dg Knee  Complete 4 Views Right  11/25/2014   CLINICAL DATA:  Fall, dementia, right hip pain  EXAM: RIGHT KNEE - COMPLETE 4+ VIEW  COMPARISON:  None.  FINDINGS: No fracture or dislocation is seen.  Moderate tricompartmental degenerative changes with chondrocalcinosis.  Visualized soft tissues are within normal limits.  No suprapatellar knee joint effusion.  IMPRESSION: No fracture or dislocation is seen.  Moderate degenerative changes.   Electronically Signed   By: Julian Hy M.D.   On: 11/25/2014 19:41   Dg Hip Unilat With Pelvis 2-3 Views Right  11/25/2014   CLINICAL DATA:  Fall.  Dementia.  Right hip pain.  EXAM: RIGHT HIP (WITH PELVIS) 2-3 VIEWS  COMPARISON:  None.  FINDINGS: Bilateral degenerative arthropathy of the hips with spurring and mild loss of articular space.  Bony demineralization.  No fracture or acute bony findings identified.  IMPRESSION: 1. No fracture or acute bony findings identified. 2. Bony demineralization. 3. Mild degenerative arthropathy of both hips.   Electronically Signed   By: Thayer Jew  Janeece Fitting M.D.   On: 11/25/2014 19:41    Microbiology: No results found for this or any previous visit (from the past 240 hour(s)).   Labs: Basic Metabolic Panel:  Recent Labs Lab 11/25/14 1842 11/26/14 0854  NA 140 142  K 3.6 3.9  CL 104 107  CO2 27 28  GLUCOSE 134* 110*  BUN 15 12  CREATININE 1.00 0.89  CALCIUM 9.1 9.1   Liver Function Tests:  Recent Labs Lab 11/25/14 1842 11/26/14 0854  AST 19 18  ALT 18 19  ALKPHOS 77 84  BILITOT 0.5 0.7  PROT 6.2* 6.6  ALBUMIN 3.4* 3.5   No results for input(s): LIPASE, AMYLASE in the last 168 hours.  Recent Labs Lab 11/25/14 1842  AMMONIA 57*   CBC:  Recent Labs Lab 11/25/14 1842 11/26/14 0854  WBC 8.9 8.6  NEUTROABS 6.5  --   HGB 12.2 12.2  HCT 36.2 36.6  MCV 87.7 86.9  PLT 216 217   Cardiac Enzymes: No results for input(s): CKTOTAL, CKMB, CKMBINDEX, TROPONINI in the last 168 hours. BNP: BNP (last 3  results) No results for input(s): BNP in the last 8760 hours.  ProBNP (last 3 results) No results for input(s): PROBNP in the last 8760 hours.  CBG: No results for input(s): GLUCAP in the last 168 hours.     SignedNita Sells  [facility] 11/26/2014, 10:42 AM

## 2014-11-26 NOTE — Evaluation (Signed)
Clinical/Bedside Swallow Evaluation Patient Details  Name: [name] MRN: [id number] Date of Birth: September 05, 1920  Today's Date: 11/26/2014 Time: SLP Start Time (ACUTE ONLY): 1115 SLP Stop Time (ACUTE ONLY): 1130 SLP Time Calculation (min) (ACUTE ONLY): 15 min  Past Medical History:  Past Medical History  Diagnosis Date  . Arthritis   . Chicken pox   . Chronic kidney disease   . Colon polyps   . Hypertension   . Dementia   . Angina   . Dysrhythmia   . Heart murmur   . Shortness of breath   . Recurrent upper respiratory infection (URI)   . Pneumonia   . Hypothyroidism   . Anemia   . Headache(784.0)   . Anxiety   . Depression   . Valvular heart disease 09/04/2012  . Left leg pain 10/02/2012  . UTI (urinary tract infection) 12/30/2012  . Acute bronchitis 04/10/2013  . Dehydration 08/31/2013  . Arthritis 10/02/2012   Past Surgical History:  Past Surgical History  Procedure Laterality Date  . Appendectomy    . Cholecystectomy    . Kidney stones    . Tonsillectomy    . Abdominal hysterectomy    . Hand surgery    . Feet surgery    . Shoulder arthroscopy    . Back surgery    . Tonsillectomy    . Eye surgery      cataract removed and eye lids lifted  . Fracture surgery      bilateral arms  . Tubal ligation    . Colonoscopy w/ polypectomy     HPI:  81 yo female adm to [facility] with AMS, s/p fall with leg pain. Swallow evaluation ordered.  History moderate to severe dementia with recent sudden decline, recent admission hospital 5/26-->5/28 metabolic encephalopathy, bacteria, progressive dementia, HTN, hypothyroid, heart murmur, restless leg syndrome, chronic left leg pain, history trochanteric bursitis.  Pt CXR negative for acute event.  Pt admits to h/o dysphagia which she attributed to her tonsils being removed but stated it resolved.    Assessment / Plan / Recommendation Clinical Impression  Pt with overall functional swallow ability based on clinical swallow evaluation.   Negative CN exam, except mild facial asymmetry on right.  Pt able to self feed and consumed pudding, cracker and juice.  Swallow was overall timely without indication of residuals.  Subtle throat clearing noted which pt states is normal.  CXR negative.  Recommend regular/thin diet for pt with general precautions.  SLP to sign off.      Aspiration Risk  Mild    Diet Recommendation Age appropriate regular solids;Thin   Medication Administration: Whole meds with liquid (pt prefers with juice) Compensations: Slow rate;Small sips/bites    Other  Recommendations Oral Care Recommendations: Oral care BID   Follow Up Recommendations    n/a   Frequency and Duration   n/a     Pertinent Vitals/Pain Afebrile, decreased      Swallow Study Prior Functional Status   see hhx    General Date of Onset: 11/26/14 Other Pertinent Information: 81 yo female adm to [facility] with AMS, s/p fall with leg pain. Swallow evaluation ordered.  History moderate to severe dementia with recent sudden decline, recent admission hospital 5/26-->5/28 metabolic encephalopathy, bacteria, progressive dementia, HTN, hypothyroid, heart murmur, restless leg syndrome, chronic left leg pain, history trochanteric bursitis.  Pt CXR negative for acute event.  Pt admits to h/o dysphagia which she attributed to her tonsils being removed but stated it resolved.  Type of Study: Bedside swallow evaluation Diet Prior to this Study: NPO Temperature Spikes Noted: No Respiratory Status: Room air History of Recent Intubation: No Behavior/Cognition: Alert;Pleasant mood;Distractible;Requires cueing;Doesn't follow directions Oral Cavity - Dentition: Other (Comment) (upper denture) Self-Feeding Abilities: Able to feed self Patient Positioning: Upright in chair/Tumbleform Baseline Vocal Quality: Normal Volitional Cough: Strong Volitional Swallow: Able to elicit    Oral/Motor/Sensory Function Overall Oral Motor/Sensory Function:  (? mild right  facial asymmetry, pt states since 81 years of age)   Ice Chips Ice chips: Not tested   Thin Liquid Thin Liquid: Within functional limits Presentation: Straw;Self Fed Other Comments: subtle throat clearing    Nectar Thick Nectar Thick Liquid: Not tested   Honey Thick Honey Thick Liquid: Not tested   Puree Puree: Within functional limits Presentation: Self Fed;Spoon Other Comments: subtle throat clearing   Solid   GO    Solid: Within functional limits Presentation: Self Lisabeth Pick, MS [facility] SLP [phone]

## 2014-11-26 NOTE — Progress Notes (Signed)
Patient was discharged home ([facility]) with home health by MD order; discharged instructions  review and give to her son with care notes;IV DIC;  patient will be escorted to the car by nurse tech via wheelchair.

## 2014-11-26 NOTE — Progress Notes (Signed)
Notified SW patient was ready for discharge.

## 2014-11-26 NOTE — Progress Notes (Signed)
Patient's son called charge nurse after 1300 o'clock and he asked if her mom can have additional help at her facility. MD ordered Case Management consult. At this time, patient is lying in her bed with sitter at bedside and she is awake.

## 2014-11-26 NOTE — Progress Notes (Signed)
Pt refused iv restart at this time.

## 2014-11-26 NOTE — Progress Notes (Signed)
LCSW and CM met with patient son and his wife in the lobby of 5w. Patient very well known to this Probation officer with regards to patient needing a higher level of care. Patient at this time is going back to her ILF with [facility] and [facility] following. Medicaid remains pending/unknown per son.  They did apply, but were sent home from [facility] after a 2 week stay. CM placing SW in home as well for patient through [facility] agency.  DC back to ILF/apartment.  Lane Hacker, MSW Clinical Social Work: Emergency Room [phone]

## 2014-11-26 NOTE — Progress Notes (Signed)
Pt pulled out PIV, attempted to insert PIV x 2  With failed attempts. IV team is at bedside, but pt refused for IV. Will cont to reinforce pt.

## 2014-11-26 NOTE — Care Management Note (Addendum)
Case Management Note  Patient Details  Name: [name] MRN: [id number] Date of Birth: 08-11-1920  Subjective/Objective:                   Leg pain Action/Plan: Discharge planning  Expected Discharge Date:  11/26/14               Expected Discharge Plan:  [place]  In-House Referral:     Discharge planning Services  CM Consult  Post Acute Care Choice:    Choice offered to:  Adult Children  DME Arranged:    DME Agency:     HH Arranged:  RN, PT, OT, Nurse's Aide,SW HH Agency:  [place]  Status of Service:  Completed, signed off  Medicare Important Message Given:    Date Medicare IM Given:    Medicare IM give by:    Date Additional Medicare IM Given:    Additional Medicare Important Message give by:     If discussed at [place] of Stay Meetings, dates discussed:    Additional Comments: CM met with Juanda Crumble (son) and Mechele Claude (daughter-in-law) of pt to arrange [facility] services.  Juanda Crumble and Mechele Claude are adamant in taking pt home as they feel she will do better where she is familiar with her surroundings.  Juanda Crumble states pt is active with [facility] for HHPT/OT as it was arranged by [facility] after pt had a short stay at [facility]. Referral called to [facility] rep, annette,  for HHPT/OT/RN/aide/SW.  Anne Ng has made note Mechele Claude and Patsey Berthold can be reached at [phone] and [phone] and are the primary contacts for pt.  Cm faxed Annette facesheet, orders, F2F, DC summary and H&P.   No other CM needs were communicated. Dellie Catholic, RN 11/26/2014, 3:58 PM

## 2014-11-26 NOTE — Progress Notes (Signed)
Patient with moderate to severe Dementia, expressed the desire to go back home. She called her son and left a message to come to pick her up. Patient's son was called many times by charge nurse and MD but he didn't answered and the messages were left. Also, staff tried to contact patient's daughter with no result. RN administered Ativan po 0.5mg  per PRN order with no effect. MD evaluated the patient and from his point of view the patient was ready to go back to Independent Living. In a meantime, patient had SLP with recommendation for regular diet and thin liquids. Social worker contacted the facility and staff was waiting for patient's family to contact us. Patient became more agitated, trying to leave the floor, hitting staff and staff called security. MD notified and he evaluated again patient's status. Per MD order, staff administered in total Ativan 2.5 mg IV (one time 0.5mg  and than 1mg  x 2). Also, MD ordered soft restraints bilateral ankle and wrist and sitter. At this time, patient is in bed with sitter at bedside. Will continue to monitor.

## 2014-11-26 NOTE — Progress Notes (Signed)
Son called and updated on events.

## 2014-11-26 NOTE — Progress Notes (Signed)
PT Cancellation Note  Patient Details Name: [name] MRN: [id number] DOB: [birth year]   Cancelled Treatment:    Reason Eval/Treat Not Completed: PT screened, no needs identified, will sign off. Pt observed walking with rolling walker in hallway with nurse and physician, no apparent needs for skilled therapy services.  If pt present safety risk due to memory/cognitive disorder, recommend ALF or supervised living situation.  If pt presents with changes in functional mobility, requires increased physical assistance or difficulty moving or changing positions, please re-consult PT to assess for needs.  Thank you.   Narda Amber [facility] 11/26/2014, 1:28 PM

## 2014-11-27 ENCOUNTER — Telehealth: Payer: Self-pay

## 2014-11-27 ENCOUNTER — Inpatient Hospital Stay (HOSPITAL_COMMUNITY)
Admission: EM | Admit: 2014-11-27 | Discharge: 2014-11-29 | DRG: 884 | Disposition: A | Discharge status: Skilled Nursing Facility | Payer: Commercial Managed Care - HMO | Attending: Internal Medicine | Admitting: Internal Medicine

## 2014-11-27 ENCOUNTER — Emergency Department (HOSPITAL_COMMUNITY): Payer: Commercial Managed Care - HMO

## 2014-11-27 ENCOUNTER — Encounter (HOSPITAL_COMMUNITY): Payer: Self-pay | Admitting: *Deleted

## 2014-11-27 DIAGNOSIS — F09 Unspecified mental disorder due to known physiological condition: Secondary | ICD-10-CM | POA: Diagnosis present

## 2014-11-27 DIAGNOSIS — R296 Repeated falls: Secondary | ICD-10-CM

## 2014-11-27 DIAGNOSIS — S3993XA Unspecified injury of pelvis, initial encounter: Secondary | ICD-10-CM | POA: Diagnosis not present

## 2014-11-27 DIAGNOSIS — R6889 Other general symptoms and signs: Secondary | ICD-10-CM | POA: Diagnosis not present

## 2014-11-27 DIAGNOSIS — M542 Cervicalgia: Secondary | ICD-10-CM | POA: Diagnosis not present

## 2014-11-27 DIAGNOSIS — S199XXA Unspecified injury of neck, initial encounter: Secondary | ICD-10-CM | POA: Diagnosis not present

## 2014-11-27 DIAGNOSIS — N189 Chronic kidney disease, unspecified: Secondary | ICD-10-CM | POA: Diagnosis not present

## 2014-11-27 DIAGNOSIS — W19XXXA Unspecified fall, initial encounter: Secondary | ICD-10-CM | POA: Diagnosis present

## 2014-11-27 DIAGNOSIS — R410 Disorientation, unspecified: Secondary | ICD-10-CM | POA: Diagnosis not present

## 2014-11-27 DIAGNOSIS — S0990XA Unspecified injury of head, initial encounter: Secondary | ICD-10-CM | POA: Diagnosis not present

## 2014-11-27 DIAGNOSIS — E039 Hypothyroidism, unspecified: Secondary | ICD-10-CM | POA: Diagnosis present

## 2014-11-27 DIAGNOSIS — M545 Low back pain: Secondary | ICD-10-CM | POA: Diagnosis not present

## 2014-11-27 DIAGNOSIS — Z885 Allergy status to narcotic agent status: Secondary | ICD-10-CM

## 2014-11-27 DIAGNOSIS — M47816 Spondylosis without myelopathy or radiculopathy, lumbar region: Secondary | ICD-10-CM | POA: Diagnosis not present

## 2014-11-27 DIAGNOSIS — I10 Essential (primary) hypertension: Secondary | ICD-10-CM | POA: Diagnosis present

## 2014-11-27 DIAGNOSIS — M79622 Pain in left upper arm: Secondary | ICD-10-CM | POA: Diagnosis not present

## 2014-11-27 DIAGNOSIS — R8290 Unspecified abnormal findings in urine: Secondary | ICD-10-CM

## 2014-11-27 DIAGNOSIS — N390 Urinary tract infection, site not specified: Secondary | ICD-10-CM | POA: Diagnosis not present

## 2014-11-27 DIAGNOSIS — G2581 Restless legs syndrome: Secondary | ICD-10-CM | POA: Diagnosis not present

## 2014-11-27 DIAGNOSIS — Z90710 Acquired absence of both cervix and uterus: Secondary | ICD-10-CM

## 2014-11-27 DIAGNOSIS — S4992XA Unspecified injury of left shoulder and upper arm, initial encounter: Secondary | ICD-10-CM | POA: Diagnosis not present

## 2014-11-27 DIAGNOSIS — Z9049 Acquired absence of other specified parts of digestive tract: Secondary | ICD-10-CM | POA: Diagnosis present

## 2014-11-27 DIAGNOSIS — R627 Adult failure to thrive: Secondary | ICD-10-CM | POA: Diagnosis not present

## 2014-11-27 DIAGNOSIS — M19012 Primary osteoarthritis, left shoulder: Secondary | ICD-10-CM | POA: Diagnosis not present

## 2014-11-27 DIAGNOSIS — F0390 Unspecified dementia without behavioral disturbance: Principal | ICD-10-CM

## 2014-11-27 DIAGNOSIS — E860 Dehydration: Secondary | ICD-10-CM

## 2014-11-27 DIAGNOSIS — R300 Dysuria: Secondary | ICD-10-CM | POA: Diagnosis present

## 2014-11-27 DIAGNOSIS — Z86010 Personal history of colonic polyps: Secondary | ICD-10-CM | POA: Diagnosis not present

## 2014-11-27 DIAGNOSIS — F419 Anxiety disorder, unspecified: Secondary | ICD-10-CM | POA: Diagnosis present

## 2014-11-27 DIAGNOSIS — Z87891 Personal history of nicotine dependence: Secondary | ICD-10-CM

## 2014-11-27 DIAGNOSIS — M25512 Pain in left shoulder: Secondary | ICD-10-CM | POA: Diagnosis not present

## 2014-11-27 DIAGNOSIS — F329 Major depressive disorder, single episode, unspecified: Secondary | ICD-10-CM | POA: Diagnosis present

## 2014-11-27 DIAGNOSIS — Z7982 Long term (current) use of aspirin: Secondary | ICD-10-CM | POA: Diagnosis not present

## 2014-11-27 DIAGNOSIS — I129 Hypertensive chronic kidney disease with stage 1 through stage 4 chronic kidney disease, or unspecified chronic kidney disease: Secondary | ICD-10-CM | POA: Diagnosis not present

## 2014-11-27 DIAGNOSIS — D649 Anemia, unspecified: Secondary | ICD-10-CM

## 2014-11-27 DIAGNOSIS — S299XXA Unspecified injury of thorax, initial encounter: Secondary | ICD-10-CM | POA: Diagnosis not present

## 2014-11-27 DIAGNOSIS — R2689 Other abnormalities of gait and mobility: Secondary | ICD-10-CM | POA: Diagnosis not present

## 2014-11-27 DIAGNOSIS — E038 Other specified hypothyroidism: Secondary | ICD-10-CM

## 2014-11-27 DIAGNOSIS — Z66 Do not resuscitate: Secondary | ICD-10-CM | POA: Diagnosis present

## 2014-11-27 DIAGNOSIS — S3992XA Unspecified injury of lower back, initial encounter: Secondary | ICD-10-CM | POA: Diagnosis not present

## 2014-11-27 DIAGNOSIS — R102 Pelvic and perineal pain: Secondary | ICD-10-CM | POA: Diagnosis not present

## 2014-11-27 LAB — URINE MICROSCOPIC-ADD ON

## 2014-11-27 LAB — CBG MONITORING, ED: Glucose-Capillary: 89 mg/dL (ref 65–99)

## 2014-11-27 LAB — URINALYSIS, ROUTINE W REFLEX MICROSCOPIC
Bilirubin Urine: NEGATIVE
Glucose, UA: NEGATIVE mg/dL
Ketones, ur: NEGATIVE mg/dL
NITRITE: NEGATIVE
PH: 5.5 (ref 5.0–8.0)
Protein, ur: NEGATIVE mg/dL
SPECIFIC GRAVITY, URINE: 1.017 (ref 1.005–1.030)
Urobilinogen, UA: 0.2 mg/dL (ref 0.0–1.0)

## 2014-11-27 LAB — CBC WITH DIFFERENTIAL/PLATELET
BASOS ABS: 0 10*3/uL (ref 0.0–0.1)
Basophils Relative: 0 % (ref 0–1)
Eosinophils Absolute: 0.1 10*3/uL (ref 0.0–0.7)
Eosinophils Relative: 1 % (ref 0–5)
HEMATOCRIT: 36.3 % (ref 36.0–46.0)
Hemoglobin: 12.1 g/dL (ref 12.0–15.0)
Lymphocytes Relative: 19 % (ref 12–46)
Lymphs Abs: 1.8 10*3/uL (ref 0.7–4.0)
MCH: 29.4 pg (ref 26.0–34.0)
MCHC: 33.3 g/dL (ref 30.0–36.0)
MCV: 88.3 fL (ref 78.0–100.0)
MONO ABS: 0.5 10*3/uL (ref 0.1–1.0)
Monocytes Relative: 5 % (ref 3–12)
Neutro Abs: 7.1 10*3/uL (ref 1.7–7.7)
Neutrophils Relative %: 75 % (ref 43–77)
PLATELETS: 200 10*3/uL (ref 150–400)
RBC: 4.11 MIL/uL (ref 3.87–5.11)
RDW: 12.6 % (ref 11.5–15.5)
WBC: 9.4 10*3/uL (ref 4.0–10.5)

## 2014-11-27 LAB — COMPREHENSIVE METABOLIC PANEL
ALBUMIN: 3.4 g/dL — AB (ref 3.5–5.0)
ALK PHOS: 81 U/L (ref 38–126)
ALT: 22 U/L (ref 14–54)
AST: 21 U/L (ref 15–41)
Anion gap: 8 (ref 5–15)
BILIRUBIN TOTAL: 0.7 mg/dL (ref 0.3–1.2)
BUN: 11 mg/dL (ref 6–20)
CO2: 26 mmol/L (ref 22–32)
Calcium: 8.8 mg/dL — ABNORMAL LOW (ref 8.9–10.3)
Chloride: 106 mmol/L (ref 101–111)
Creatinine, Ser: 0.84 mg/dL (ref 0.44–1.00)
GFR calc Af Amer: >60 mL/min (ref >60)
GFR calc non Af Amer: 59 mL/min — ABNORMAL LOW (ref >60)
Glucose, Bld: 96 mg/dL (ref 65–99)
Potassium: 3.9 mmol/L (ref 3.5–5.1)
SODIUM: 140 mmol/L (ref 135–145)
TOTAL PROTEIN: 6.3 g/dL — AB (ref 6.5–8.1)

## 2014-11-27 LAB — TROPONIN I: Troponin I: <0.03 ng/mL (ref <0.031)

## 2014-11-27 LAB — CK: CK TOTAL: 63 U/L (ref 38–234)

## 2014-11-27 MED ORDER — ONDANSETRON HCL 4 MG PO TABS: 4.0000 mg | ORAL_TABLET | Freq: Four times a day (QID) | ORAL | Status: DC | PRN

## 2014-11-27 MED ORDER — DEXTROSE 5 % IV SOLN
1.0000 g | Freq: Once | INTRAVENOUS | Status: AC
  Administered 2014-11-27: 1 g via INTRAVENOUS
  Filled 2014-11-27: qty 10

## 2014-11-27 MED ORDER — ACETAMINOPHEN 325 MG PO TABS
650.0000 mg | ORAL_TABLET | ORAL | Status: DC | PRN
  Filled 2014-11-27: qty 2

## 2014-11-27 MED ORDER — LORAZEPAM 0.5 MG PO TABS: 0.5000 mg | ORAL_TABLET | Freq: Three times a day (TID) | ORAL | Status: DC | PRN

## 2014-11-27 MED ORDER — LEVOTHYROXINE SODIUM 50 MCG PO TABS
50.0000 ug | ORAL_TABLET | Freq: Every day | ORAL | Status: DC
Start: 2014-11-28 — End: 2014-11-29
  Administered 2014-11-28 – 2014-11-29 (×2): 50 ug via ORAL
  Filled 2014-11-27 (×3): qty 1

## 2014-11-27 MED ORDER — ASPIRIN EC 81 MG PO TBEC
81.0000 mg | DELAYED_RELEASE_TABLET | Freq: Every day | ORAL | Status: DC
  Administered 2014-11-28 – 2014-11-29 (×2): 81 mg via ORAL
  Filled 2014-11-27 (×3): qty 1

## 2014-11-27 MED ORDER — MAGNESIUM OXIDE 400 (241.3 MG) MG PO TABS
200.0000 mg | ORAL_TABLET | Freq: Every day | ORAL | Status: DC
  Administered 2014-11-28 – 2014-11-29 (×2): 200 mg via ORAL
  Filled 2014-11-27 (×2): qty 0.5

## 2014-11-27 MED ORDER — CEFTRIAXONE SODIUM IN DEXTROSE 20 MG/ML IV SOLN
1.0000 g | INTRAVENOUS | Status: DC
  Administered 2014-11-28: 1 g via INTRAVENOUS
  Filled 2014-11-27: qty 50

## 2014-11-27 MED ORDER — TRAMADOL HCL 50 MG PO TABS
50.0000 mg | ORAL_TABLET | Freq: Four times a day (QID) | ORAL | Status: DC | PRN
  Filled 2014-11-27: qty 1

## 2014-11-27 MED ORDER — ALBUTEROL SULFATE (2.5 MG/3ML) 0.083% IN NEBU: 3.0000 mL | INHALATION_SOLUTION | Freq: Four times a day (QID) | RESPIRATORY_TRACT | Status: DC | PRN

## 2014-11-27 MED ORDER — SODIUM CHLORIDE 0.9 % IV SOLN
INTRAVENOUS | Status: DC
  Administered 2014-11-27: 17:00:00 via INTRAVENOUS

## 2014-11-27 MED ORDER — CEFTRIAXONE SODIUM 1 G IJ SOLR
1.0000 g | Freq: Once | INTRAMUSCULAR | Status: DC
  Filled 2014-11-27: qty 10

## 2014-11-27 MED ORDER — LOSARTAN POTASSIUM 25 MG PO TABS
25.0000 mg | ORAL_TABLET | Freq: Every day | ORAL | Status: DC
  Administered 2014-11-28 – 2014-11-29 (×2): 25 mg via ORAL
  Filled 2014-11-27 (×2): qty 1

## 2014-11-27 MED ORDER — ONDANSETRON HCL 4 MG/2ML IJ SOLN: 4.0000 mg | Freq: Four times a day (QID) | INTRAMUSCULAR | Status: DC | PRN

## 2014-11-27 MED ORDER — MAGNESIUM 250 MG PO TABS: 250.0000 mg | ORAL_TABLET | Freq: Every day | ORAL | Status: DC

## 2014-11-27 MED ORDER — ENOXAPARIN SODIUM 40 MG/0.4ML ~~LOC~~ SOLN
40.0000 mg | SUBCUTANEOUS | Status: DC
Start: 2014-11-27 — End: 2014-11-29
  Administered 2014-11-27 – 2014-11-29 (×2): 40 mg via SUBCUTANEOUS
  Filled 2014-11-27 (×3): qty 0.4

## 2014-11-27 NOTE — ED Provider Notes (Signed)
CSN: [id number]     Arrival date & time 11/27/14  1128 History   First MD Initiated Contact with Patient 11/27/14 1129     Chief Complaint  Patient presents with  . Neck Pain     (Consider location/radiation/quality/duration/timing/severity/associated sxs/prior Treatment) HPI Comments: Presents to emergency room for evaluation after a fall. Patient has had 3 falls in the last 24 hours. She was seen yesterday for a fall, has fallen at least 2 times since then. Patient probably laid on the floor for an extended period of time after the last fall. Family reports that she did hit her head. She is complaining of neck pain and left arm pain. Level V Caveat due to dementia.  Patient is a 81 y.o. female presenting with neck pain.  Neck Pain   Past Medical History  Diagnosis Date  . Arthritis   . Chicken pox   . Chronic kidney disease   . Colon polyps   . Hypertension   . Dementia   . Angina   . Dysrhythmia   . Heart murmur   . Shortness of breath   . Recurrent upper respiratory infection (URI)   . Pneumonia   . Hypothyroidism   . Anemia   . Headache(784.0)   . Anxiety   . Depression   . Valvular heart disease 09/04/2012  . Left leg pain 10/02/2012  . UTI (urinary tract infection) 12/30/2012  . Acute bronchitis 04/10/2013  . Dehydration 08/31/2013  . Arthritis 10/02/2012   Past Surgical History  Procedure Laterality Date  . Appendectomy    . Cholecystectomy    . Kidney stones    . Tonsillectomy    . Abdominal hysterectomy    . Hand surgery    . Feet surgery    . Shoulder arthroscopy    . Back surgery    . Tonsillectomy    . Eye surgery      cataract removed and eye lids lifted  . Fracture surgery      bilateral arms  . Tubal ligation    . Colonoscopy w/ polypectomy     Family History  Problem Relation Age of Onset  . Heart disease Mother   . Heart disease Father   . Heart disease Other   . Birth defects Other    History  Substance Use Topics  . Smoking status:  Former Smoker -- 0.20 packs/day for 1 years    Types: Cigarettes    Quit date: 06/02/1941  . Smokeless tobacco: Never Used  . Alcohol Use: No   OB History    No data available     Review of Systems  Unable to perform ROS: Dementia  Musculoskeletal: Positive for neck pain.      Allergies  Codeine and Hydrocodone  Home Medications   Prior to Admission medications   Medication Sig Start Date End Date Taking? Authorizing Provider  albuterol (PROVENTIL HFA;VENTOLIN HFA) 108 (90 BASE) MCG/ACT inhaler Inhale 2 puffs into the lungs every 6 (six) hours as needed for wheezing or shortness of breath. 09/23/13   Sandford CrazeMelissa O'Sullivan, NP  aspirin 81 MG tablet Take 81 mg by mouth daily.    Historical Provider, MD  lactulose (CHRONULAC) 10 GM/15ML solution Take 30 mLs (20 g total) by mouth 2 (two) times daily. 11/26/14   Rhetta MuraJai-Gurmukh Samtani, MD  levothyroxine (SYNTHROID, LEVOTHROID) 50 MCG tablet TAKE 1 TABLET (50 MCG TOTAL) BY MOUTH DAILY. 09/25/14   Judie BonusElizabeth A Kollar, MD  LORazepam (ATIVAN) 1 MG tablet Take 0.5  mg by mouth every 8 (eight) hours as needed for anxiety or sleep.     Historical Provider, MD  losartan (COZAAR) 50 MG tablet Take 1 tablet (50 mg total) by mouth daily. Patient taking differently: Take 25 mg by mouth daily.  10/28/14   Catarina Hartshorn, MD  Magnesium 250 MG TABS Take 250 mg by mouth daily.     Historical Provider, MD   BP 149/57 mmHg  Pulse 58  Temp(Src) 98.1 F (36.7 C) (Oral)  Resp 20 Physical Exam  Constitutional: She is oriented to person, place, and time. She appears well-developed and well-nourished. No distress.  HENT:  Head: Normocephalic and atraumatic.  Right Ear: Hearing normal.  Left Ear: Hearing normal.  Nose: Nose normal.  Mouth/Throat: Oropharynx is clear and moist and mucous membranes are normal.  Eyes: Conjunctivae and EOM are normal. Pupils are equal, round, and reactive to light.  Neck: Normal range of motion. Neck supple. Muscular tenderness present.     Cardiovascular: Regular rhythm, S1 normal and S2 normal.  Exam reveals no gallop and no friction rub.   No murmur heard. Pulmonary/Chest: Effort normal and breath sounds normal. No respiratory distress. She exhibits no tenderness.  Abdominal: Soft. Normal appearance and bowel sounds are normal. There is no hepatosplenomegaly. There is no tenderness. There is no rebound, no guarding, no tenderness at McBurney's point and negative Murphy's sign. No hernia.  Musculoskeletal: Normal range of motion.       Left shoulder: She exhibits tenderness. She exhibits no deformity.       Back:       Left upper arm: She exhibits tenderness. She exhibits no deformity.  Neurological: She is alert and oriented to person, place, and time. She has normal strength. No cranial nerve deficit or sensory deficit. Coordination normal. GCS eye subscore is 4. GCS verbal subscore is 5. GCS motor subscore is 6.  Skin: Skin is warm, dry and intact. No rash noted. No cyanosis.  Psychiatric: She has a normal mood and affect. Her speech is normal and behavior is normal. Thought content normal.  Nursing note and vitals reviewed.   ED Course  Procedures (including critical care time) Labs Review Labs Reviewed  CBC WITH DIFFERENTIAL/PLATELET  COMPREHENSIVE METABOLIC PANEL  TROPONIN I  URINALYSIS, ROUTINE W REFLEX MICROSCOPIC (NOT AT [facility])  CK  CBG MONITORING, ED    Imaging Review Dg Chest 1 View  11/27/2014   CLINICAL DATA:  Left side neck pain, fall yesterday, fall today, confusion  EXAM: CHEST  1 VIEW  COMPARISON:  11/25/2014  FINDINGS: Cardiomediastinal silhouette is stable. Degenerative changes right shoulder again noted. Minimal mid thoracic dextroscoliosis. Stable degenerative changes thoracic spine. No acute infiltrate or pulmonary edema. There is no pneumothorax. Mild degenerative changes left shoulder.  IMPRESSION: No active disease. Mild degenerative changes thoracic spine. Stable degenerative changes  bilateral shoulders. No pneumothorax.   Electronically Signed   By: Natasha Mead M.D.   On: 11/27/2014 13:07   Dg Chest 2 View  11/25/2014   CLINICAL DATA:  Left chest pain.  Fall.  Dementia.  EXAM: CHEST  2 VIEW  COMPARISON:  10/26/2014  FINDINGS: The The degenerative glenohumeral arthropathy bilaterally. Heart size within normal limits.  Thoracic spondylosis.  No airspace opacity identified.  No pleural effusion noted.  IMPRESSION: 1. No pneumothorax, pleural effusion, or acute thoracic findings. Please note that nondisplaced rib fractures can be occult on conventional radiography. 2. Degenerative glenohumeral arthropathy bilaterally.   Electronically Signed   By: Zollie Beckers  Ova Freshwater M.D.   On: 11/25/2014 19:40   Ct Head Wo Contrast  11/27/2014   CLINICAL DATA:  Left-sided neck pain. Found on the ground. Fell yesterday. Larey Seat again. Dementia.  EXAM: CT HEAD WITHOUT CONTRAST  CT CERVICAL SPINE WITHOUT CONTRAST  TECHNIQUE: Multidetector CT imaging of the head and cervical spine was performed following the standard protocol without intravenous contrast. Multiplanar CT image reconstructions of the cervical spine were also generated.  COMPARISON:  11/25/2014 and multiple previous  FINDINGS: CT HEAD FINDINGS  Generalized brain atrophy. Mild chronic small-vessel ischemic changes affecting the deep white matter, basal ganglia and thalami. No sign of acute infarction, mass lesion, hemorrhage, hydrocephalus or extra-axial collection. No skull fracture. No fluid in the sinuses, middle ears or mastoids.  CT CERVICAL SPINE FINDINGS  Alignment is normal. No soft tissue swelling. No fracture. Ordinary mild spondylosis at C3-4, C4-5, C5-6 and C6-7. Mild facet degeneration.  IMPRESSION: Head CT: Atrophy an old small vessel ischemic changes. No acute finding.  Cervical spine CT: Chronic degenerative changes. No acute or traumatic finding.   Electronically Signed   By: Paulina Fusi M.D.   On: 11/27/2014 12:44   Ct Head Wo  Contrast  11/25/2014   CLINICAL DATA:  Right-sided numbness  EXAM: CT HEAD WITHOUT CONTRAST  TECHNIQUE: Contiguous axial images were obtained from the base of the skull through the vertex without intravenous contrast.  COMPARISON:  MRI brain dated 10/24/2014  FINDINGS: No evidence of parenchymal hemorrhage or extra-axial fluid collection. No mass lesion, mass effect, or midline shift.  No CT evidence of acute infarction.  Subcortical white matter and periventricular small vessel ischemic changes. Mild intracranial atherosclerosis.  Global cortical atrophy.  No ventriculomegaly.  The visualized paranasal sinuses are essentially clear. The mastoid air cells are unopacified.  No evidence of calvarial fracture.  IMPRESSION: No evidence of acute intracranial abnormality.  Atrophy with small vessel ischemic changes.   Electronically Signed   By: Charline Bills M.D.   On: 11/25/2014 20:12   Ct Cervical Spine Wo Contrast  11/27/2014   CLINICAL DATA:  Left-sided neck pain. Found on the ground. Fell yesterday. Larey Seat again. Dementia.  EXAM: CT HEAD WITHOUT CONTRAST  CT CERVICAL SPINE WITHOUT CONTRAST  TECHNIQUE: Multidetector CT imaging of the head and cervical spine was performed following the standard protocol without intravenous contrast. Multiplanar CT image reconstructions of the cervical spine were also generated.  COMPARISON:  11/25/2014 and multiple previous  FINDINGS: CT HEAD FINDINGS  Generalized brain atrophy. Mild chronic small-vessel ischemic changes affecting the deep white matter, basal ganglia and thalami. No sign of acute infarction, mass lesion, hemorrhage, hydrocephalus or extra-axial collection. No skull fracture. No fluid in the sinuses, middle ears or mastoids.  CT CERVICAL SPINE FINDINGS  Alignment is normal. No soft tissue swelling. No fracture. Ordinary mild spondylosis at C3-4, C4-5, C5-6 and C6-7. Mild facet degeneration.  IMPRESSION: Head CT: Atrophy an old small vessel ischemic changes. No  acute finding.  Cervical spine CT: Chronic degenerative changes. No acute or traumatic finding.   Electronically Signed   By: Paulina Fusi M.D.   On: 11/27/2014 12:44   Dg Knee Complete 4 Views Right  11/25/2014   CLINICAL DATA:  Fall, dementia, right hip pain  EXAM: RIGHT KNEE - COMPLETE 4+ VIEW  COMPARISON:  None.  FINDINGS: No fracture or dislocation is seen.  Moderate tricompartmental degenerative changes with chondrocalcinosis.  Visualized soft tissues are within normal limits.  No suprapatellar knee joint effusion.  IMPRESSION: No  fracture or dislocation is seen.  Moderate degenerative changes.   Electronically Signed   By: Charline Bills M.D.   On: 11/25/2014 19:41   Dg Hip Unilat With Pelvis 2-3 Views Right  11/25/2014   CLINICAL DATA:  Fall.  Dementia.  Right hip pain.  EXAM: RIGHT HIP (WITH PELVIS) 2-3 VIEWS  COMPARISON:  None.  FINDINGS: Bilateral degenerative arthropathy of the hips with spurring and mild loss of articular space.  Bony demineralization.  No fracture or acute bony findings identified.  IMPRESSION: 1. No fracture or acute bony findings identified. 2. Bony demineralization. 3. Mild degenerative arthropathy of both hips.   Electronically Signed   By: Gaylyn Rong M.D.   On: 11/25/2014 19:41     EKG Interpretation None      MDM   Final diagnoses:  Fall  Fall  Fall  uti  Patient has had 3 falls in the last 24 hours. She resides in an independent living apartment for seniors. The last time she fell she was unable to get up and laid on the floor for some time. No evidence of rhabdomyolysis. No evidence of MI. Blood work was unremarkable. Urinalysis suggest infection. This might be the source of the patient's weakness and falls. Will consult hospitalist to admit her to the hospital for further management.    Gilda Crease, MD 11/27/14 [phone]

## 2014-11-27 NOTE — Telephone Encounter (Signed)
Spoke to pt son: Pt fell 3 times in 2 days and has been readmitted to the hospital as of 11/27/14   Transition Care Management Follow-up Telephone Call  How have you been since you were released from the hospital? Patient was admitted 6/23 and 6/27    Do you understand why you were in the hospital? Son has POA.    Do you understand the discharge instrcutions? Son understands  Items Reviewed:  Medications reviewed: Yes  Allergies reviewed: yes   Dietary changes reviewed: no  Referrals reviewed: yes (recommendation were reviewed - however those might change).    Functional Questionnaire:   Activities of Daily Living (ADLs):   She states they are independent in the following: currently but that status may change.  States they require assistance with the following: Son states pt does.     Any transportation issues/concerns?: Son is involved.    Any patient concerns? Pt staying alone, gait stability and over well being.    Confirmed importance and date/time of follow-up visits scheduled: yes. Pt son and his wife will be present at the appointment.   Confirmed with patient if condition begins to worsen call PCP or go to the ER.  Patient was given the office number and encouraged to call back with question or concerns: yes

## 2014-11-27 NOTE — ED Notes (Signed)
[facility] called for L sided neck pain after pt found on ground (pt was just seen here for fall yesterday, and after she was released family states she fell again and hit her head).  When EMS arrived pt was sitting in chair.  CBG 122.  BP 140/60 hr 60 nsr.  EMS stated that when they arrived pt was normal loc per son (hx of dementia).  However, when she was on stretcher in ambulance, pt became flacid and difficult to arouse.

## 2014-11-27 NOTE — ED Notes (Signed)
C-collar removed per Dr Blinda Leatherwood

## 2014-11-27 NOTE — Progress Notes (Addendum)
LCSW aware of patient as her son Leonette Most called as he is in en route to come to [facility]. Reports patient fell twice last night and feels she has dislocated her shoulder.  Patient was agitated and unmanagable per son.  Reports she needs some sort or placement.  LCSW is actively seeking if patient has Medicaid as it is listed in her chart, but per review it appears to just be pending and not active yet.  Patient son and his wife did complete application, but have not followed up with doctor.  LCSW has called [facility] in effort to see if they could manage patient in memory care unit with medicaid pending. Beds are available and LCSW faxed referral. Called admissions at [facility] with regards to patient being admitted on May 28th and left on June 16th with her [employer].  Patient was offered to stay long term however declined and wanted to go home with family.  Patient was cooperative and compliant with staff at [facility], just asked daily to use phone to call family, friends and taxi.  Other than anxiety of wanting to leave, patient did well in SNF.       Deretha Emory, MSW Clinical Social Work: Emergency Room [phone]

## 2014-11-27 NOTE — H&P (Signed)
[facility] History and Physical                                                                                    [name], is a 81 y.o. female  MRN: [id number]   DOB - 1920-08-10  Admit Date - 11/27/2014  Outpatient Primary MD for the patient is Judie Bonus, MD  Referring MD: Blinda Leatherwood / ER  With History of -  Past Medical History  Diagnosis Date  . Arthritis   . Chicken pox   . Chronic kidney disease   . Colon polyps   . Hypertension   . Dementia   . Angina   . Dysrhythmia   . Heart murmur   . Shortness of breath   . Recurrent upper respiratory infection (URI)   . Pneumonia   . Hypothyroidism   . Anemia   . Headache(784.0)   . Anxiety   . Depression   . Valvular heart disease 09/04/2012  . Left leg pain 10/02/2012  . UTI (urinary tract infection) 12/30/2012  . Acute bronchitis 04/10/2013  . Dehydration 08/31/2013  . Arthritis 10/02/2012      Past Surgical History  Procedure Laterality Date  . Appendectomy    . Cholecystectomy    . Kidney stones    . Tonsillectomy    . Abdominal hysterectomy    . Hand surgery    . Feet surgery    . Shoulder arthroscopy    . Back surgery    . Tonsillectomy    . Eye surgery      cataract removed and eye lids lifted  . Fracture surgery      bilateral arms  . Tubal ligation    . Colonoscopy w/ polypectomy      in for   Chief Complaint  Patient presents with  . Neck Pain     HPI This is a 81 year old female patient who has been admitted to this hospital 3 times since March for issues related to falls and recurrent UTI symptoms. She was most recently discharged yesterday on 6/26 after being admitted for issues related to leg pain and constipation and failure to thrive. She was evaluated by physical therapy prior to discharge and was observed walking with a rolling walker in the hallway with nurse and physician with no apparent needs for skilled therapy services. She was deemed a safety risk due to memory and  cognitive disorder with recommendations given for assisted living facility or supervised living situation. At time of discharge Medicaid application was pending and applications to memory care appropriate facilities were still pending. Patient was sent back to her independent living apartment where she lives alone although she does have her son and daughter-in-law living in the same building several rooms down. Son went to check on his mother today and found her down on the floor. Although she does have dementia she was able to recall she has fallen several times since coming home from the hospital last night. Patient has restless leg syndrome and one of the falls was witnessed by the son as the patient was rushing to the bathroom. Patient is complaining of urinary frequency and dysuria.  She is also complaining of being very hungry and thirsty.  In the ER patient was afebrile, pulse was 56 and BP was 124/48 room air saturations were 98%. Patient was not orthostatic when checked. Multiple x-rays were checked including CT of the head and neck as well as x-rays of the shoulders, thoracic spine and lumbar spine and pelvis and right humerus all of which revealed no acute injuries. Electrolyte panel and CBC were within normal limits. Urinalysis appear consistent with UTI and urine culture has been obtained. CK 63, troponin less than 0.03.   Review of Systems   In addition to the HPI above,  No Fever-chills, myalgias or other constitutional symptoms No Headache, changes with Vision or hearing, new weakness, tingling, numbness in any extremity, No problems swallowing food or Liquids, indigestion/reflux No Chest pain, Cough or Shortness of Breath, palpitations, orthopnea or DOE No Abdominal pain, N/V; no melena or hematochezia, no dark tarry stools, Bowel movements are regular, No hematuria or flank pain No new skin rashes, lesions, masses or bruises, No new joints pains-aches No recent weight gain or loss No  polyuria, polydypsia or polyphagia,  *A full 10 point Review of Systems was done, except as stated above, all other Review of Systems were negative.  Social History History  Substance Use Topics  . Smoking status: Former Smoker -- 0.20 packs/day for 1 years    Types: Cigarettes    Quit date: 06/02/1941  . Smokeless tobacco: Never Used  . Alcohol Use: No    Resides at: Independent living apartment and same building as son and daughter-in-law  Lives with: Alone  Ambulatory status: Ambulatory with rolling walker but unfortunately keeps having repeated falls noting his had 3 falls in the past 24 hours   Family History Family History  Problem Relation Age of Onset  . Heart disease Mother   . Heart disease Father   . Heart disease Other   . Birth defects Other      Prior to Admission medications   Medication Sig Start Date End Date Taking? Authorizing Provider  albuterol (PROVENTIL HFA;VENTOLIN HFA) 108 (90 BASE) MCG/ACT inhaler Inhale 2 puffs into the lungs every 6 (six) hours as needed for wheezing or shortness of breath. 09/23/13  Yes Sandford Craze, NP  aspirin 81 MG tablet Take 81 mg by mouth daily.   Yes Historical Provider, MD  levothyroxine (SYNTHROID, LEVOTHROID) 50 MCG tablet TAKE 1 TABLET (50 MCG TOTAL) BY MOUTH DAILY. 09/25/14  Yes Judie Bonus, MD  LORazepam (ATIVAN) 1 MG tablet Take 0.5 mg by mouth every 8 (eight) hours as needed for anxiety or sleep.    Yes Historical Provider, MD  losartan (COZAAR) 50 MG tablet Take 1 tablet (50 mg total) by mouth daily. 10/28/14  Yes Catarina Hartshorn, MD  Magnesium 250 MG TABS Take 250 mg by mouth daily.    Yes Historical Provider, MD  lactulose (CHRONULAC) 10 GM/15ML solution Take 30 mLs (20 g total) by mouth 2 (two) times daily. 11/26/14   Rhetta Mura, MD    Allergies  Allergen Reactions  . Codeine Rash  . Hydrocodone Rash    Physical Exam  Vitals  Blood pressure 141/70, pulse 64, temperature 98.1 F (36.7 C),  temperature source Oral, resp. rate 24, SpO2 99 %.   General:  In no acute distress, appears stated age  Psych:  Flat affect, marked short-term memory deficits  Neuro:   No focal neurological deficits, CN II through XII intact, Strength 5/5 all 4 extremities,  Sensation intact all 4 extremities.  ENT:  Tiny subcentimeter laceration that did not require suturing right temporal area, Ears and Eyes appear Normal, Conjunctivae clear, PER. Very dry oral mucosa without erythema or exudates. Cracking skin corners of lips  Neck:  Supple, No lymphadenopathy appreciated  Respiratory:  Symmetrical chest wall movement, Good air movement bilaterally, CTAB. Room Air  Cardiac:  RRR, No Murmurs, no LE edema noted, no JVD, No carotid bruits, peripheral pulses palpable at 2+  Abdomen:  Positive bowel sounds, Soft, Non tender, Non distended,  No masses appreciated, no obvious hepatosplenomegaly  Skin:  No Cyanosis, Normal Skin Turgor, No Skin Rash or Bruise.  Extremities: Symmetrical without obvious trauma or injury,  no effusions.  Data Review  CBC  Recent Labs Lab 11/25/14 1842 11/26/14 0854 11/27/14 1407  WBC 8.9 8.6 9.4  HGB 12.2 12.2 12.1  HCT 36.2 36.6 36.3  PLT 216 217 200  MCV 87.7 86.9 88.3  MCH 29.5 29.0 29.4  MCHC 33.7 33.3 33.3  RDW 12.5 12.4 12.6  LYMPHSABS 1.8  --  1.8  MONOABS 0.4  --  0.5  EOSABS 0.1  --  0.1  BASOSABS 0.0  --  0.0    Chemistries   Recent Labs Lab 11/25/14 1842 11/26/14 0854 11/27/14 1407  NA 140 142 140  K 3.6 3.9 3.9  CL 104 107 106  CO2 GLUCOSE 134* 110* 96  BUN CREATININE 1.00 0.89 0.84  CALCIUM 9.1 9.1 8.8*  AST ALT ALKPHOS 77 84 81  BILITOT 0.5 0.7 0.7    estimated creatinine clearance is 44.5 mL/min (by C-G formula based on Cr of 0.84).  No results for input(s): TSH, T4TOTAL, T3FREE, THYROIDAB in the last 72 hours.  Invalid input(s): FREET3  Coagulation profile  Recent Labs Lab  11/25/14 1842  INR 1.05    No results for input(s): DDIMER in the last 72 hours.  Cardiac Enzymes  Recent Labs Lab 11/27/14 1407  TROPONINI <0.03    Invalid input(s): POCBNP  Urinalysis    Component Value Date/Time   COLORURINE YELLOW 11/27/2014 1408   APPEARANCEUR CLEAR 11/27/2014 1408   LABSPEC 1.017 11/27/2014 1408   PHURINE 5.5 11/27/2014 1408   GLUCOSEU NEGATIVE 11/27/2014 1408   GLUCOSEU NEGATIVE 04/06/2014 1127   HGBUR TRACE* 11/27/2014 1408   BILIRUBINUR NEGATIVE 11/27/2014 1408   BILIRUBINUR small 03/14/2014 1151   KETONESUR NEGATIVE 11/27/2014 1408   PROTEINUR NEGATIVE 11/27/2014 1408   PROTEINUR trace 03/14/2014 1151   UROBILINOGEN 0.2 11/27/2014 1408   UROBILINOGEN 0.2 03/14/2014 1151   NITRITE NEGATIVE 11/27/2014 1408   NITRITE positive 03/14/2014 1151   LEUKOCYTESUR SMALL* 11/27/2014 1408    Imaging results:   Dg Chest 1 View  11/27/2014   CLINICAL DATA:  Left side neck pain, fall yesterday, fall today, confusion  EXAM: CHEST  1 VIEW  COMPARISON:  11/25/2014  FINDINGS: Cardiomediastinal silhouette is stable. Degenerative changes right shoulder again noted. Minimal mid thoracic dextroscoliosis. Stable degenerative changes thoracic spine. No acute infiltrate or pulmonary edema. There is no pneumothorax. Mild degenerative changes left shoulder.  IMPRESSION: No active disease. Mild degenerative changes thoracic spine. Stable degenerative changes bilateral shoulders. No pneumothorax.   Electronically Signed   By: Natasha Mead M.D.   On: 11/27/2014 13:07   Dg Chest 2 View  11/25/2014   CLINICAL DATA:  Left chest pain.  Fall.  Dementia.  EXAM: CHEST  2 VIEW  COMPARISON:  10/26/2014  FINDINGS: The The degenerative glenohumeral arthropathy bilaterally. Heart size within normal limits.  Thoracic spondylosis.  No airspace opacity identified.  No pleural effusion noted.  IMPRESSION: 1. No pneumothorax, pleural effusion, or acute thoracic findings. Please note that  nondisplaced rib fractures can be occult on conventional radiography. 2. Degenerative glenohumeral arthropathy bilaterally.   Electronically Signed   By: Gaylyn Rong M.D.   On: 11/25/2014 19:40   Dg Thoracic Spine 2 View  11/27/2014   CLINICAL DATA:  Larey Seat yesterday, found on ground.  Larey Seat again today.  EXAM: THORACIC SPINE - 2 VIEW  COMPARISON:  11/27/2014  FINDINGS: there is mild curvature in degenerative change in the thoracic region. No evidence of thoracic fracture. Posterior medial ribs appear normal.  IMPRESSION: No acute or traumatic finding. Mild curvature and degenerative change.   Electronically Signed   By: Paulina Fusi M.D.   On: 11/27/2014 13:07   Dg Lumbar Spine Complete  11/27/2014   CLINICAL DATA:  Recent fall low back pain  EXAM: LUMBAR SPINE - COMPLETE 4+ VIEW  COMPARISON:  07/08/2014  FINDINGS: Vertebral body height is well maintained mild disc space narrowing is again noted at L3-4 with vacuum disc phenomena. Mild anterolisthesis of L4 on L5 is noted of a degenerative nature. No definitive pars defects are seen mild osteophytic changes are noted. No soft tissue changes are noted.  IMPRESSION: Degenerative change without acute abnormality.   Electronically Signed   By: Alcide Clever M.D.   On: 11/27/2014 13:07   Dg Pelvis 1-2 Views  11/27/2014   CLINICAL DATA:  Initial encounter for fall to ground.  Pain.  EXAM: PELVIS - 1-2 VIEW  COMPARISON:  None.  FINDINGS: There is no evidence of pelvic fracture or diastasis. No pelvic bone lesions are seen.  IMPRESSION: Negative.   Electronically Signed   By: Kennith Center M.D.   On: 11/27/2014 13:27   Ct Head Wo Contrast  11/27/2014   CLINICAL DATA:  Left-sided neck pain. Found on the ground. Fell yesterday. Larey Seat again. Dementia.  EXAM: CT HEAD WITHOUT CONTRAST  CT CERVICAL SPINE WITHOUT CONTRAST  TECHNIQUE: Multidetector CT imaging of the head and cervical spine was performed following the standard protocol without intravenous contrast.  Multiplanar CT image reconstructions of the cervical spine were also generated.  COMPARISON:  11/25/2014 and multiple previous  FINDINGS: CT HEAD FINDINGS  Generalized brain atrophy. Mild chronic small-vessel ischemic changes affecting the deep white matter, basal ganglia and thalami. No sign of acute infarction, mass lesion, hemorrhage, hydrocephalus or extra-axial collection. No skull fracture. No fluid in the sinuses, middle ears or mastoids.  CT CERVICAL SPINE FINDINGS  Alignment is normal. No soft tissue swelling. No fracture. Ordinary mild spondylosis at C3-4, C4-5, C5-6 and C6-7. Mild facet degeneration.  IMPRESSION: Head CT: Atrophy an old small vessel ischemic changes. No acute finding.  Cervical spine CT: Chronic degenerative changes. No acute or traumatic finding.   Electronically Signed   By: Paulina Fusi M.D.   On: 11/27/2014 12:44   Ct Head Wo Contrast  11/25/2014   CLINICAL DATA:  Right-sided numbness  EXAM: CT HEAD WITHOUT CONTRAST  TECHNIQUE: Contiguous axial images were obtained from the base of the skull through the vertex without intravenous contrast.  COMPARISON:  MRI brain dated 10/24/2014  FINDINGS: No evidence of parenchymal hemorrhage or extra-axial fluid collection. No mass lesion, mass effect, or midline shift.  No CT evidence of acute infarction.  Subcortical white  matter and periventricular small vessel ischemic changes. Mild intracranial atherosclerosis.  Global cortical atrophy.  No ventriculomegaly.  The visualized paranasal sinuses are essentially clear. The mastoid air cells are unopacified.  No evidence of calvarial fracture.  IMPRESSION: No evidence of acute intracranial abnormality.  Atrophy with small vessel ischemic changes.   Electronically Signed   By: Charline Bills M.D.   On: 11/25/2014 20:12   Ct Cervical Spine Wo Contrast  11/27/2014   CLINICAL DATA:  Left-sided neck pain. Found on the ground. Fell yesterday. Larey Seat again. Dementia.  EXAM: CT HEAD WITHOUT CONTRAST   CT CERVICAL SPINE WITHOUT CONTRAST  TECHNIQUE: Multidetector CT imaging of the head and cervical spine was performed following the standard protocol without intravenous contrast. Multiplanar CT image reconstructions of the cervical spine were also generated.  COMPARISON:  11/25/2014 and multiple previous  FINDINGS: CT HEAD FINDINGS  Generalized brain atrophy. Mild chronic small-vessel ischemic changes affecting the deep white matter, basal ganglia and thalami. No sign of acute infarction, mass lesion, hemorrhage, hydrocephalus or extra-axial collection. No skull fracture. No fluid in the sinuses, middle ears or mastoids.  CT CERVICAL SPINE FINDINGS  Alignment is normal. No soft tissue swelling. No fracture. Ordinary mild spondylosis at C3-4, C4-5, C5-6 and C6-7. Mild facet degeneration.  IMPRESSION: Head CT: Atrophy an old small vessel ischemic changes. No acute finding.  Cervical spine CT: Chronic degenerative changes. No acute or traumatic finding.   Electronically Signed   By: Paulina Fusi M.D.   On: 11/27/2014 12:44   Dg Shoulder Left  11/27/2014   CLINICAL DATA:  Larey Seat.  Pain.  EXAM: LEFT SHOULDER - 2+ VIEW  COMPARISON:  None.  FINDINGS: There is osteoarthritis of the glenohumeral joint. There probably small intra-articular loose bodies. There is narrowing of the humeral acromial distance consistent with rotator cuff disease, probably chronic. AC joint appears normal.  IMPRESSION: No acute or traumatic finding. Osteoarthritis of the glenohumeral joint, possibly with small loose bodies. Narrowed humeral acromial distance that could be associated with rotator cuff tear.   Electronically Signed   By: Paulina Fusi M.D.   On: 11/27/2014 13:08   Dg Knee Complete 4 Views Right  11/25/2014   CLINICAL DATA:  Fall, dementia, right hip pain  EXAM: RIGHT KNEE - COMPLETE 4+ VIEW  COMPARISON:  None.  FINDINGS: No fracture or dislocation is seen.  Moderate tricompartmental degenerative changes with chondrocalcinosis.   Visualized soft tissues are within normal limits.  No suprapatellar knee joint effusion.  IMPRESSION: No fracture or dislocation is seen.  Moderate degenerative changes.   Electronically Signed   By: Charline Bills M.D.   On: 11/25/2014 19:41   Dg Humerus Left  11/27/2014   CLINICAL DATA:  LEFT side neck pain, found on ground, fell yesterday and fell again today striking head, confusion  EXAM: LEFT HUMERUS - 2+ VIEW  COMPARISON:  None  FINDINGS: Elbow joint alignment normal.  Shoulder imaged separately.  Bones demineralized.  No acute fracture, dislocation, or bone destruction.  IMPRESSION: No acute abnormalities.   Electronically Signed   By: Ulyses Southward M.D.   On: 11/27/2014 13:08   Dg Hip Unilat With Pelvis 2-3 Views Right  11/25/2014   CLINICAL DATA:  Fall.  Dementia.  Right hip pain.  EXAM: RIGHT HIP (WITH PELVIS) 2-3 VIEWS  COMPARISON:  None.  FINDINGS: Bilateral degenerative arthropathy of the hips with spurring and mild loss of articular space.  Bony demineralization.  No fracture or acute bony findings identified.  IMPRESSION: 1. No fracture or acute bony findings identified. 2. Bony demineralization. 3. Mild degenerative arthropathy of both hips.   Electronically Signed   By: Gaylyn RongWalter  Liebkemann M.D.   On: 11/25/2014 19:41     Assessment & Plan  Principal Problem:   Recurrent falls -Admit to floor -At this juncture patient is unsafe to return to independent living environment -Fortunately no significant traumatic injuries over the past 24 hours -Child psychotherapistocial worker consultation for appropriate placement post discharge (see below) -Physical therapy and occupational therapy evaluations -Ambulate only with assistance -H and has restless leg as well as frequent urination and dysuria which appears to be chronic with a current acute component which is also influencing patient's impulsive mobility  Active Problems:   Dementia/FTT in adult -Concerns verbalized by patient's family about  patient's inability to remain independent after discharge -Clearly with recurrent falls and patient documented forgetting to take medications and forgetting to eat that she needs to be placed in a supervised facility preferably a memory care unit-family agrees    Dehydration -On exam oral mucous membranes are clearly dry despite normal renal function -Low rate IV fluids    Hypothyroidism -Continue Synthroid    Abnormal urinalysis -Begin empiric Rocephin -Follow up on her and culture noting patient did apparently receive at least 1 dose of antibiotics the previous admission therefore culture results may be falsely negative so recommend continue a full course of antibiotics; given the fact she seems to have recurrent UTI may need a total of 10 days therapy    Normocytic anemia -Hemoglobin stable at baseline    Hypertension -Blood pressure controlled but given apparent dehydration will decrease Cozaar from 50 mg to 25 mg at time of admission    Restless leg syndrome    DVT Prophylaxis: Lovenox  Family Communication:   None and daughter-in-law at bedside  Code Status:  DO NOT RESUSCITATE  Condition: Stable   Discharge disposition: Anticipate discharge to skilled nursing facility with memory care unit which patient medically cleared and a bed available  Time spent in minutes : 60      ELLIS,ALLISON L. ANP on 11/27/2014 at 4:56 PM  Between 7am to 7pm - Pager - [phone]  After 7pm go to [email or web address] - password [email or web address]  And look for the night coverage person covering me after hours  [facility]

## 2014-11-27 NOTE — ED Notes (Signed)
Called lab and they stated  They are running blood.

## 2014-11-28 DIAGNOSIS — R296 Repeated falls: Secondary | ICD-10-CM

## 2014-11-28 LAB — CBC
HEMATOCRIT: 35.2 % — AB (ref 36.0–46.0)
Hemoglobin: 11.8 g/dL — ABNORMAL LOW (ref 12.0–15.0)
MCH: 29.4 pg (ref 26.0–34.0)
MCHC: 33.5 g/dL (ref 30.0–36.0)
MCV: 87.6 fL (ref 78.0–100.0)
Platelets: 211 10*3/uL (ref 150–400)
RBC: 4.02 MIL/uL (ref 3.87–5.11)
RDW: 12.4 % (ref 11.5–15.5)
WBC: 8.2 10*3/uL (ref 4.0–10.5)

## 2014-11-28 LAB — COMPREHENSIVE METABOLIC PANEL
ALBUMIN: 3.2 g/dL — AB (ref 3.5–5.0)
ALT: 20 U/L (ref 14–54)
AST: 19 U/L (ref 15–41)
Alkaline Phosphatase: 81 U/L (ref 38–126)
Anion gap: 4 — ABNORMAL LOW (ref 5–15)
BUN: 11 mg/dL (ref 6–20)
CALCIUM: 8.7 mg/dL — AB (ref 8.9–10.3)
CO2: 25 mmol/L (ref 22–32)
Chloride: 110 mmol/L (ref 101–111)
Creatinine, Ser: 0.97 mg/dL (ref 0.44–1.00)
GFR calc Af Amer: 57 mL/min — ABNORMAL LOW (ref >60)
GFR calc non Af Amer: 49 mL/min — ABNORMAL LOW (ref >60)
Glucose, Bld: 126 mg/dL — ABNORMAL HIGH (ref 65–99)
Potassium: 3.8 mmol/L (ref 3.5–5.1)
Sodium: 139 mmol/L (ref 135–145)
Total Bilirubin: 0.5 mg/dL (ref 0.3–1.2)
Total Protein: 6 g/dL — ABNORMAL LOW (ref 6.5–8.1)

## 2014-11-28 LAB — URINE CULTURE: CULTURE: NO GROWTH

## 2014-11-28 MED ORDER — LORAZEPAM 2 MG/ML IJ SOLN
1.0000 mg | INTRAMUSCULAR | Status: DC | PRN
  Administered 2014-11-28 – 2014-11-29 (×2): 1 mg via INTRAVENOUS
  Filled 2014-11-28 (×2): qty 1

## 2014-11-28 MED ORDER — LORAZEPAM 2 MG/ML IJ SOLN
1.0000 mg | Freq: Once | INTRAMUSCULAR | Status: AC
  Administered 2014-11-28: 1 mg via INTRAMUSCULAR
  Filled 2014-11-28: qty 1

## 2014-11-28 MED ORDER — TUBERCULIN PPD 5 UNIT/0.1ML ID SOLN
5.0000 [IU] | Freq: Once | INTRADERMAL | Status: DC
  Administered 2014-11-28: 5 [IU] via INTRADERMAL
  Filled 2014-11-28: qty 0.1

## 2014-11-28 NOTE — Clinical Social Work Note (Addendum)
RN with [facility], Victorino DikeJennifer, is evaluating patient [facility]. CSW has made several referrals to memory care facilities in the area. The following facilities have been contacted:  [facility] - Facility states they cannot accept patient at this time due to no bed availability. [facility] - "Unable to meet patient's needs" [facility] - "Unable to [facility]'s needs" [facility] - "Private pay only." Family unable to do this. [facility] - Facility states they will be able to admit the patient tomorrow if "financials" check out for the patient and if [facility] can offer an LOG. Son states that he does not want the patient this far away. CSW has explained that if this is the only option for the patient, the family may not have a choice. If the facility offers for the patient and the family refuses the patient will be sent home again. [facility] - Considering taking the patient. UPDATE: Facility has evaluated patient, patient appears clinically appropriate for facility. RN Victorino DikeJennifer will relay the information she gather during evaluation back to the facility. This facility may be a viable option for the patient. [name] - Referral sent, waiting for response.  [facility] - Referral sent per son's request. [facility] - Referral sent per son's request.   Roddie McBryant Kerria Sapien MSW, [place]CSWA, [place], [phone]

## 2014-11-28 NOTE — Progress Notes (Signed)
   11/28/14 1100  What Happened  Was fall witnessed? Yes  Who witnessed fall? Jenn Miller  Patients activity before fall ambulating-assisted  Point of contact (knees)  Was patient injured? No  Follow Up  MD notified myers  Time MD notified 1130  Family notified No- patient refusal  Additional tests No  Simple treatment Other (comment) (non required)  Progress note created (see row info) Yes  Madelin RearLonnie Terril Amaro, MSN, RN, [employer]RN

## 2014-11-28 NOTE — Clinical Social Work Placement (Signed)
CLINICAL SOCIAL WORK PLACEMENT  NOTE  Date:  11/28/2014  Patient Details  Name: [name] MRN: [id number] Date of Birth: 1921/02/21  Clinical Social Work is seeking post-discharge placement for this patient at the Assisted Living Facility (Memory Care) level of care (*CSW will initial, date and re-position this form in  chart as items are completed):      Patient/family provided with [facility] Clinical Social Work Department's list of facilities offering this level of care within the geographic area requested by the patient (or if unable, by the patient's family).  Yes   Patient/family informed of their freedom to choose among providers that offer the needed level of care, that participate in Medicare, Medicaid or managed care program needed by the patient, have an available bed and are willing to accept the patient.  Yes   Patient/family informed of [facility]'s ownership interest in [facility] and [facility], as well as of the fact that they are under no obligation to receive care at these facilities.  PASRR submitted to EDS on       PASRR number received on       Existing PASRR number confirmed on       FL2 transmitted to all facilities in geographic area requested by pt/family on       FL2 transmitted to all facilities within larger geographic area on       Patient informed that his/her managed care company has contracts with or will negotiate with certain facilities, including the following:            Patient/family informed of bed offers received.  Patient chooses bed at       Physician recommends and patient chooses bed at      Patient to be transferred to   on  .  Patient to be transferred to facility by       Patient family notified on   of transfer.  Name of family member notified:        PHYSICIAN       Additional Comment:    _______________________________________________ Venita Lick, LCSW 11/28/2014, 6:33 PM

## 2014-11-28 NOTE — Evaluation (Signed)
Occupational Therapy Evaluation and Discharge Patient Details Name: [name] MRN: [id number] DOB: 10-Jul-1920 Today's Date: 11/28/2014    History of Present Illness 81 yo female found down at home with son, pt found to have UTI. PMHx: dementia, hypertension, arthritis    Clinical Impression   This 81 yo female admitted with above presents to acute OT at a S level due to h/o falls and cognitive deficits, no further OT needs, we will sign off.    Follow Up Recommendations  No OT follow up (needs ALF with memory care unit)    Equipment Recommendations  None recommended by OT       Precautions / Restrictions Precautions Precautions: Fall Restrictions Weight Bearing Restrictions: No      Mobility Bed Mobility               General bed mobility comments: Pt up walking with staff in hallway upon my arrival  Transfers Overall transfer level: Needs assistance Equipment used: Rolling walker (2 wheeled)             General transfer comment: S for sit<>stand and amulation         ADL                                         General ADL Comments: S due to h/o falls     Vision Additional Comments: wears glasses          Pertinent Vitals/Pain Pain Assessment: No/denies pain     Hand Dominance Right   Extremity/Trunk Assessment Upper Extremity Assessment Upper Extremity Assessment: Overall WFL for tasks assessed   Lower Extremity Assessment Lower Extremity Assessment: Overall WFL for tasks assessed       Communication Communication Communication: No difficulties   Cognition Arousal/Alertness: Awake/alert Behavior During Therapy: Anxious;Agitated ("they took my clothes") Overall Cognitive Status: No family/caregiver present to determine baseline cognitive functioning (pt very distraught that her clothes are not here--this is her only focus to get dressed and leave. )                                Home Living  Family/patient expects to be discharged to:: Assisted living (memory care unit)                                        Prior Functioning/Environment Level of Independence: Needs assistance  Gait / Transfers Assistance Needed: S with AD due to h/o falls ADL's / Homemaking Assistance Needed: S due to h/o falls   Comments: uses cane in her apartment, but has had falls    OT Diagnosis: Cognitive deficits         OT Goals(Current goals can be found in the care plan section) Acute Rehab OT Goals Patient Stated Goal: to find my clothes and go home  OT Frequency:                End of Session Equipment Utilized During Treatment: Rolling walker  Activity Tolerance: Patient tolerated treatment well Patient left: in chair;with call bell/phone within reach;with chair alarm set;with nursing/sitter in room   Time: 1016-1029 OT Time Calculation (min): 13 min Charges:  OT General Charges $OT Visit: 1 Procedure OT Evaluation $Initial OT  Evaluation Tier I: 1 Procedure  Evette Georges [phone] 11/28/2014, 10:43 AM

## 2014-11-28 NOTE — Discharge Instructions (Signed)

## 2014-11-28 NOTE — Care Management Note (Signed)
Case Management Note  Patient Details  Name: [name]  MRN: [id number] Date of Birth: 1921/03/05  Subjective/Objective:  [place] , where son just lives down the hall from her.  Patient does have medicaid active per [employer] with financial counseling.  NCM informed CSW  Of this information.  CSW and NCM  following patient for dispo.                    Action/Plan:   Expected Discharge Date:                  Expected Discharge Plan:  Skilled Nursing Facility  In-House Referral:  Clinical Social Work  Discharge planning Services  CM Consult  Post Acute Care Choice:    Choice offered to:     DME Arranged:    DME Agency:     HH Arranged:    HH Agency:     Status of Service:  In process, will continue to follow  Medicare Important Message Given:    Date Medicare IM Given:    Medicare IM give by:    Date Additional Medicare IM Given:    Additional Medicare Important Message give by:     If discussed at Long Length of Stay Meetings, dates discussed:    Additional Comments:  Leone Havenaylor, Riddhi Grether Clinton, RN 11/28/2014, 11:28 AM

## 2014-11-28 NOTE — Clinical Social Work Note (Signed)
CSW has attempted to reach family to complete assessment, messages were left. CSW has started facility search for placement.   Roddie McBryant Adarius Tigges MSW, [place], [place], [phone]

## 2014-11-28 NOTE — Progress Notes (Signed)
Patient TB skin test placed in the inner forearm of the left arm.

## 2014-11-28 NOTE — Clinical Social Work Note (Signed)
CSW had long conversation with the patient's son and daughter in law this evening. CSW reiterated that the patient will need to DC to first available facility. Patient's son [name] would like to avoid any long distance facilities but understands that this may be the only option until he can have the patient moved in the future. [name] shares that the patient receives $1209/month in SSI. He states the patient has no other sources of income or assets.  CSW has discussed case with Chiropodistassistant director who will followup with covering CSW in the AM. ChiropodistAssistant director will speak with financial counselor to determine if patient is going to qualify for long term medicaid which would be needed for placement. The patient currently has [place] medicaid which only assists with covering Part B expenses not covered by Medicare.   Patient's son asks that [facility]. CSW has faxed referral to these facilities but made it clear to [name] that these facilities will not be likely options for admission tomorrow. CSW will leave detailed report for covering CSW.    Roddie McBryant Saharsh Sterling MSW, [place]CS[place], [place], [phone]

## 2014-11-28 NOTE — Progress Notes (Signed)
Patient educated about getting a new IV- pt refused. IV team came up and spoke with patient- but still refused. States "i'm not crazy but I don't need it because I am going home in the morning."

## 2014-11-28 NOTE — Discharge Summary (Signed)
Physician Discharge Summary  YSAMAR KONO ZOX:[id number] DOB: 01-04-1921 DOA: 11/27/2014  PCP: Judie Bonus, MD  Admit date: 11/27/2014 Discharge date: 11/28/2014  Recommendations for Outpatient Follow-up:  1. Pt will need to follow up with PCP in 2-3 weeks post discharge 2. Please obtain BMP to evaluate electrolytes and kidney function 3. Please also check CBC to evaluate Hg and Hct levels  Discharge Diagnoses:  Principal Problem:   Recurrent falls Active Problems   Dementia   Restless leg syndrome  Discharge Condition: Stable  Diet recommendation: Heart healthy diet discussed in details   History of present illness:  81 year old female who has been admitted to this hospital 3 times since March for issues related to falls and recurrent UTI symptoms. She was most recently discharged 6/26 after being admitted for issues related to leg pain and constipation and failure to thrive. She was evaluated by physical therapy prior to discharge and was observed walking with a rolling walker in the hallway with nurse and physician with no apparent needs for skilled therapy services. She was deemed a safety risk due to memory and cognitive disorder with recommendations given for assisted living facility or supervised living situation. She presented to [facility] ED 6/28 after son reported he found his mother lying down on the floor very agitated and confused.   In the ER patient was hemodynamically stable with VSS, UA with moderate leukocytes and pt started on empiric rocephin. TRH asked to admit for further evaluation.   Hospital Course:  Principal Problem:   Recurrent falls - probably related to progressive dementia and FTT, deconditioning - will need placement to memory care unit when bed available - pt ambulation well in hallway with staff and with use of walker   Active Problems:   ? UTI - recently treated with ABX - urine culture with no growth to date, please note that UA in her case  almost always with mod leukocytes - pt is asymptomatic, afebrile and with normal WBC, will stop Rocephin     Hypertension - reasonable inpatient control    Hypothyroidism - continue synthroid     Dementia with FTT (failure to thrive) in adult - placement in progress - ativan as needed helped while inpatient for agitation     Lovenox SQ provided while pt hospitalized for DVT prophylaxis  Attempted to update family by phone, awaiting call back  Procedures/Studies: Dg Chest 1 View 11/27/2014  No active disease. Mild degenerative changes thoracic spine. Stable degenerative changes bilateral shoulders. No pneumothorax.     Dg Thoracic Spine 2 View 11/27/2014   No acute or traumatic finding. Mild curvature and degenerative change.    Dg Lumbar Spine Complete 11/27/2014   Degenerative change without acute abnormality.    Dg Pelvis 1-2 Views 11/27/2014  Negative.    Ct Cervical Spine Wo Contrast 11/27/2014  Atrophy an old small vessel ischemic changes. No acute finding.  Cervical spine CT: Chronic degenerative changes. No acute or traumatic finding.     Dg Shoulder Left 11/27/2014  No acute or traumatic finding. Osteoarthritis of the glenohumeral joint, possibly with small loose bodies. Narrowed humeral acromial distance that could be associated with rotator cuff tear.   Discharge Exam: Filed Vitals:   11/28/14 1444  BP: 120/52  Pulse: 54  Temp: 98.4 F (36.9 C)  Resp: 18   Filed Vitals:   11/28/14 0519 11/28/14 0615 11/28/14 1135 11/28/14 1444  BP: 126/42 137/67 127/50 120/52  Pulse: 62 68 50 54  Temp: 98.2  F (36.8 C)  98.4 F (36.9 C) 98.4 F (36.9 C)  TempSrc: Oral  Oral Oral  Resp: 17  18 18   Height:      Weight: 79.6 kg (175 lb 7.8 oz)     SpO2: 98%  96% 95%    General: Pt is alert, confused and agitated at time, oriented to name only  Cardiovascular: Regular rate and rhythm, S1/S2 +, no murmurs, no rubs, no gallops Respiratory: Clear to auscultation bilaterally,  no wheezing, no crackles, no rhonchi Abdominal: Soft, non tender, non distended, bowel sounds +, no guardingl  Discharge Instructions     Medication List    TAKE these medications        albuterol 108 (90 BASE) MCG/ACT inhaler  Commonly known as:  PROVENTIL HFA;VENTOLIN HFA  Inhale 2 puffs into the lungs every 6 (six) hours as needed for wheezing or shortness of breath.     aspirin 81 MG tablet  Take 81 mg by mouth daily.     lactulose 10 GM/15ML solution  Commonly known as:  CHRONULAC  Take 30 mLs (20 g total) by mouth 2 (two) times daily.     levothyroxine 50 MCG tablet  Commonly known as:  SYNTHROID, LEVOTHROID  TAKE 1 TABLET (50 MCG TOTAL) BY MOUTH DAILY.     LORazepam 1 MG tablet  Commonly known as:  ATIVAN  Take 0.5 mg by mouth every 8 (eight) hours as needed for anxiety or sleep.     losartan 50 MG tablet  Commonly known as:  COZAAR  Take 1 tablet (50 mg total) by mouth daily.     Magnesium 250 MG Tabs  Take 250 mg by mouth daily.            Follow-up Information    Follow up with Judie Bonus, MD.   Specialty:  Internal Medicine   Contact information:   [address] [place] [address] [phone]        The results of significant diagnostics from this hospitalization (including imaging, microbiology, ancillary and laboratory) are listed below for reference.     Microbiology: Recent Results (from the past 240 hour(s))  Urine culture     Status: None   Collection Time: 11/27/14  4:08 PM  Result Value Ref Range Status   Specimen Description URINE, CATHETERIZED  Final   Special Requests NONE  Final   Culture NO GROWTH 1 DAY  Final   Report Status 11/28/2014 FINAL  Final     Labs: Basic Metabolic Panel:  Recent Labs Lab 11/25/14 1842 11/26/14 0854 11/27/14 1407 11/28/14 0732  NA 140 142 140 139  K 3.6 3.9 3.9 3.8  CL 104 107 106 110  CO2 27 28 26 25   GLUCOSE 134* 110* 96 126*  BUN 15 12 11 11   CREATININE 1.00 0.89  0.84 0.97  CALCIUM 9.1 9.1 8.8* 8.7*   Liver Function Tests:  Recent Labs Lab 11/25/14 1842 11/26/14 0854 11/27/14 1407 11/28/14 0732  AST 19 18 21 19   ALT 18 19 22 20   ALKPHOS 77 84 81 81  BILITOT 0.5 0.7 0.7 0.5  PROT 6.2* 6.6 6.3* 6.0*  ALBUMIN 3.4* 3.5 3.4* 3.2*   No results for input(s): LIPASE, AMYLASE in the last 168 hours.  Recent Labs Lab 11/25/14 1842  AMMONIA 57*   CBC:  Recent Labs Lab 11/25/14 1842 11/26/14 0854 11/27/14 1407 11/28/14 0732  WBC 8.9 8.6 9.4 8.2  NEUTROABS 6.5  --  7.1  --  HGB 12.2 12.2 12.1 11.8*  HCT 36.2 36.6 36.3 35.2*  MCV 87.7 86.9 88.3 87.6  PLT 216 217 200 211   Cardiac Enzymes:  Recent Labs Lab 11/27/14 1407  CKTOTAL 63  TROPONINI <0.03    CBG:  Recent Labs Lab 11/27/14 1154  GLUCAP 89     SIGNED: Time coordinating discharge: 30 minutes  MAGICK-Brandey Vandalen, MD  [facility] 11/28/2014, 4:10 PM Pager [phone]  If 7PM-7AM, please contact night-coverage [email or web address] Password [email or web address]

## 2014-11-28 NOTE — Evaluation (Signed)
Physical Therapy Evaluation Patient Details Name: [name] MRN: [id number] DOB: [birth year] Today's Date: 11/28/2014   History of Present Illness  81 yo female found down at home with son, pt found to have UTI. PMHx: dementia, hypertension, arthritis   Clinical Impression  Pt amb with walker and supervision. Pt will remain high fall risk due to cognition and needs a supervised setting.    Follow Up Recommendations Other (comment);Supervision/Assistance - 24 hour (ALF/Memory Care unit)    Equipment Recommendations  None recommended by PT    Recommendations for Other Services       Precautions / Restrictions Precautions Precautions: Fall Restrictions Weight Bearing Restrictions: No      Mobility  Bed Mobility               General bed mobility comments: Pt up in chair  Transfers Overall transfer level: Needs assistance Equipment used: Rolling walker (2 wheeled) Transfers: Sit to/from Stand Sit to Stand: Supervision         General transfer comment: supervision for safety due to cognitive deficits  Ambulation/Gait Ambulation/Gait assistance: Supervision Ambulation Distance (Feet): 200 Feet Assistive device: Rolling walker (2 wheeled) Gait Pattern/deviations: Step-through pattern;Decreased stride length     General Gait Details: Verbal cues to keep walker with her when turning to chair. No LOB when using walker.  Stairs            Wheelchair Mobility    Modified Rankin (Stroke Patients Only)       Balance Overall balance assessment: Needs assistance Sitting-balance support: No upper extremity supported;Feet supported Sitting balance-Leahy Scale: Good     Standing balance support: No upper extremity supported Standing balance-Leahy Scale: Fair                               Pertinent Vitals/Pain Pain Assessment: No/denies pain    Home Living Family/patient expects to be discharged to:: Assisted living (memory care unit)                      Prior Function Level of Independence: Needs assistance   Gait / Transfers Assistance Needed: S with AD due to h/o falls  ADL's / Homemaking Assistance Needed: S due to h/o falls  Comments: uses cane in her apartment, but has had falls     Hand Dominance   Dominant Hand: Right    Extremity/Trunk Assessment   Upper Extremity Assessment: Overall WFL for tasks assessed           Lower Extremity Assessment: Overall WFL for tasks assessed         Communication   Communication: No difficulties  Cognition Arousal/Alertness: Awake/alert Behavior During Therapy: Anxious;Agitated ("they took my clothes") Overall Cognitive Status: No family/caregiver present to determine baseline cognitive functioning Area of Impairment: Attention;Memory;Safety/judgement   Current Attention Level: Sustained Memory: Decreased recall of precautions;Decreased short-term memory   Safety/Judgement: Decreased awareness of safety;Decreased awareness of deficits          General Comments      Exercises        Assessment/Plan    PT Assessment Patent does not need any further PT services  PT Diagnosis Difficulty walking   PT Problem List    PT Treatment Interventions     PT Goals (Current goals can be found in the Care Plan section) Acute Rehab PT Goals Patient Stated Goal: to find my clothes and go home PT Goal  Formulation: All assessment and education complete, DC therapy    Frequency     Barriers to discharge        Co-evaluation               End of Session   Activity Tolerance: Patient tolerated treatment well Patient left: in chair;with call bell/phone within reach;with chair alarm set;with nursing/sitter in room           Time: 1150-1200 PT Time Calculation (min) (ACUTE ONLY): 10 min   Charges:   PT Evaluation $Initial PT Evaluation Tier I: 1 Procedure     PT G Codes:        [name] 12/10/2014, 1:37 PM  [facility]  PT [phone]

## 2014-11-28 NOTE — Clinical Social Work Note (Signed)
Clinical Social Work Assessment  Patient Details  Name: [name] MRN: [id number] Date of Birth: [birth year]  Date of referral:  11/28/14               Reason for consult:  Facility Placement                Permission sought to share information with:  Case Manager, Magazine features editoracility Contact Representative, Family Supports Permission granted to share information::  Yes, Verbal Permission Granted  Name::     Son [name]  Agency::  [facility], other facilities that offer supervision  Relationship::     Contact Information:     Housing/Transportation Living arrangements for the past 2 months:  [place] Source of Information:  Development worker, communityMedical Team, Adult Children Patient Interpreter Needed:  None Criminal Activity/Legal Involvement Pertinent to Current Situation/Hospitalization:  No - Comment as needed Significant Relationships:  Adult Children, Other Family Members Lives with:  Self Do you feel safe going back to the place where you live?  No (frrequent falls, memory loss) Need for family participation in patient care:  Yes (Comment) (Dementia)  Care giving concerns:  Per son, patient living alone and just cannot manage any longer. Frequent falls with 2 admission/ED visits in a week causing family to be greatly concerned with patient going back home.  Patient is independent with ADLs, needs locked unit with 24 hour supervision   Social Worker assessment / plan:  LCSW first was consulted on Sunday 6/26 as patient was discharged home to self care with family following up.  Patient lives in independent living facility. Family has been seeking palcement for her for several months with the barrier being insurance. Patient is walking and doing too well with ADLs for insurance to pay for SNF. Family had no applied for Medicaid to receive LTC benefit for SA and ALF.  This admission, patient has a medicaid number which is pending authorization, which allows for ALF to be considered. Son agreeable for  referrals and [facility] and coming to assess patient.   Unit CSW made aware of plan and in agreement.   FL2 updated on 6/26 and if going to ALF, will need to be re-updated with patient's medications.  Plan:  ALF to assess patient for placement.  Employment status:  Retired, Disabled (Comment on whether or not currently receiving Disability) Insurance information:  Teacher, English as a foreign languageManaged Medicare, Medicaid [place] PT Recommendations:  24 Hour Supervision (memory care unit) Information / Referral to community resources:  Other (Comment Required) (Assisted Living )  Patient/Family's Response to care:  Agreeable to plan  Patient/Family's Understanding of and Emotional Response to Diagnosis, Current Treatment, and Prognosis:  Family reports relief in assessment and the possibility to being placed in a memory care unit.  Emotional Assessment Appearance:  Appears stated age Attitude/Demeanor/Rapport:  Reactive, Aggressive (Verbally and/or physically) Affect (typically observed):  Anxious, Apprehensive, Overwhelmed Orientation:  Oriented to Self Alcohol / Substance use:  Not Applicable Psych involvement (Current and /or in the community):  No (Comment)  Discharge Needs  Concerns to be addressed:  Decision making concerns, Home Safety Concerns, Financial / Insurance Concerns, Cognitive Concerns Readmission within the last 30 days:  Yes Current discharge risk:  Physical Impairment, Lives alone Barriers to Discharge:  Unsafe home situation, Continued Medical Work up   Raye SorrowCoble, Deborah Monical N, LCSW 11/28/2014, 2:24 PM

## 2014-11-29 LAB — CBC
HCT: 34.9 % — ABNORMAL LOW (ref 36.0–46.0)
HEMOGLOBIN: 11.8 g/dL — AB (ref 12.0–15.0)
MCH: 29.9 pg (ref 26.0–34.0)
MCHC: 33.8 g/dL (ref 30.0–36.0)
MCV: 88.4 fL (ref 78.0–100.0)
PLATELETS: 204 10*3/uL (ref 150–400)
RBC: 3.95 MIL/uL (ref 3.87–5.11)
RDW: 12.5 % (ref 11.5–15.5)
WBC: 7.7 10*3/uL (ref 4.0–10.5)

## 2014-11-29 LAB — BASIC METABOLIC PANEL
Anion gap: 9 (ref 5–15)
BUN: 14 mg/dL (ref 6–20)
CALCIUM: 9 mg/dL (ref 8.9–10.3)
CHLORIDE: 106 mmol/L (ref 101–111)
CO2: 26 mmol/L (ref 22–32)
Creatinine, Ser: 0.92 mg/dL (ref 0.44–1.00)
GFR calc Af Amer: >60 mL/min (ref >60)
GFR calc non Af Amer: 52 mL/min — ABNORMAL LOW (ref >60)
Glucose, Bld: 94 mg/dL (ref 65–99)
Potassium: 3.8 mmol/L (ref 3.5–5.1)
Sodium: 141 mmol/L (ref 135–145)

## 2014-11-29 MED ORDER — MORPHINE SULFATE 2 MG/ML IJ SOLN
1.0000 mg | INTRAMUSCULAR | Status: DC | PRN
  Administered 2014-11-29: 1 mg via INTRAVENOUS
  Filled 2014-11-29: qty 1

## 2014-11-29 MED ORDER — LORAZEPAM 1 MG PO TABS: 0.5000 mg | ORAL_TABLET | Freq: Three times a day (TID) | ORAL | Status: DC | PRN

## 2014-11-29 NOTE — Clinical Social Work Placement (Signed)
CLINICAL SOCIAL WORK PLACEMENT  NOTE  Date:  11/29/2014  Patient Details  Name: [name] MRN: [id number] Date of Birth: [birth year]  Clinical Social Work is seeking post-discharge placement for this patient at the Assisted Living Facility (Memory Care) level of care (*CSW will initial, date and re-position this form in  chart as items are completed):      Patient/family provided with [facility] Clinical Social Work Department's list of facilities offering this level of care within the geographic area requested by the patient (or if unable, by the patient's family).  Yes   Patient/family informed of their freedom to choose among providers that offer the needed level of care, that participate in Medicare, Medicaid or managed care program needed by the patient, have an available bed and are willing to accept the patient.  Yes   Patient/family informed of [facility]'s ownership interest in [facility] and [facility], as well as of the fact that they are under no obligation to receive care at these facilities.  PASRR submitted to EDS on 11/28/14     PASRR number received on 11/28/14     Existing PASRR number confirmed on       FL2 transmitted to all facilities in geographic area requested by pt/family on 11/28/14     FL2 transmitted to all facilities within larger geographic area on       Patient informed that his/her managed care company has contracts with or will negotiate with certain facilities, including the following:        Yes   Patient/family informed of bed offers received.  Patient chooses bed at [facility]     Physician recommends and patient chooses bed at      Patient to be transferred to [facility] on 11/29/14.  Patient to be transferred to facility by Ambulance     Patient family notified on 11/29/14 of transfer.  Name of family member notified:  [name] (Patient son over the phone)     PHYSICIAN       Additional Comment:     Macario Golds, LCSW [phone]

## 2014-11-29 NOTE — Progress Notes (Signed)
Pt seen and examined, no changes to DC summary as dictated yesterday by Dr.Myers, [facility]  Zannie CovePreetha Kamren Heskett, MD [phone]

## 2014-11-29 NOTE — Progress Notes (Signed)
NURSING PROGRESS NOTE  [name] [id number] Discharge Data: 11/29/2014 4:30 PM Attending Provider: Zannie CovePreetha Joseph, MD WUJ:[id number]:Kollar, Austin MilesElizabeth A, MD   [name] to be D/C'd to Assisted Living Facility [facility](Memory Care) per MD order. All paperwork in packet including signed copy of DNR by MD given to EMS staff with hard copy of lorazepam prescription.    All IV's will be discontinued and monitored for bleeding.  All belongings will be returned to patient for patient to take home.  Last Documented Vital Signs:  Blood pressure 122/73, pulse 68, temperature 97.8 F (36.6 C), temperature source Oral, resp. rate 18, height 5\' 5"  (1.651 m), weight 79.6 kg (175 lb 7.8 oz), SpO2 96 %.  Leane PlattSpencer Sevrin Sally RN, BS, BSN

## 2014-11-29 NOTE — Care Management Note (Signed)
Case Management Note  Patient Details  Name: Jeannette CorpusStella P Whaling MRN: [id number] Date of Birth: September 02, 1920  Subjective/Objective:   Patient is for dc to SNF or memory Care unit today. CSW following.                 Action/Plan:   Expected Discharge Date:                  Expected Discharge Plan:  Skilled Nursing Facility  In-House Referral:  Clinical Social Work  Discharge planning Services  CM Consult  Post Acute Care Choice:    Choice offered to:     DME Arranged:    DME Agency:     HH Arranged:    HH Agency:     Status of Service:  Completed, signed off  Medicare Important Message Given:    Date Medicare IM Given:    Medicare IM give by:    Date Additional Medicare IM Given:    Additional Medicare Important Message give by:     If discussed at Long Length of Stay Meetings, dates discussed:    Additional Comments:  Leone Havenaylor, Diego Delancey Clinton, RN 11/29/2014, 11:46 AM

## 2014-11-29 NOTE — Clinical Social Work Note (Signed)
Clinical Social Worker facilitated patient discharge including contacting patient family and facility to confirm patient discharge plans.  Clinical information faxed to facility and family agreeable with plan.  CSW communicated patient [place] application date of 11/13/2014 with Gwenith DailyYolanda Headen [phone]) to receiving facility.  CSW spoke with supervisor who approved 30 day LOG in the event that patient [place] Medicaid is not in place.  CSW arranged ambulance transport via [facility] to [facility].  RN to call report prior to discharge.  Clinical Social Worker will sign off for now as social work intervention is no longer needed. Please consult us again if new need arises.  Macario GoldsJesse Danyia Borunda, Kentucky[place]CS[place] [phone]

## 2014-11-30 NOTE — Telephone Encounter (Signed)
[facility] [phone].

## 2014-12-01 ENCOUNTER — Telehealth: Payer: Self-pay

## 2014-12-01 NOTE — Telephone Encounter (Signed)
Pt was on TCM list.   Pt cancelled appt with PCP.   LVM for pt to call back as soon as possible.  RE: schedule hospital follow up appt.

## 2014-12-01 NOTE — Telephone Encounter (Signed)
[name] advised that appts were cancelled because the patient has been placed in assisted living [facility])

## 2014-12-05 ENCOUNTER — Emergency Department (HOSPITAL_COMMUNITY)
Admission: EM | Admit: 2014-12-05 | Discharge: 2014-12-05 | Disposition: A | Discharge status: Home / Self Care | Payer: Commercial Managed Care - HMO | Attending: Emergency Medicine | Admitting: Emergency Medicine

## 2014-12-05 ENCOUNTER — Encounter (HOSPITAL_COMMUNITY): Payer: Self-pay | Admitting: *Deleted

## 2014-12-05 ENCOUNTER — Emergency Department (HOSPITAL_COMMUNITY): Payer: Commercial Managed Care - HMO

## 2014-12-05 DIAGNOSIS — Z8619 Personal history of other infectious and parasitic diseases: Secondary | ICD-10-CM | POA: Insufficient documentation

## 2014-12-05 DIAGNOSIS — R451 Restlessness and agitation: Secondary | ICD-10-CM | POA: Diagnosis not present

## 2014-12-05 DIAGNOSIS — Z87891 Personal history of nicotine dependence: Secondary | ICD-10-CM | POA: Diagnosis not present

## 2014-12-05 DIAGNOSIS — R011 Cardiac murmur, unspecified: Secondary | ICD-10-CM | POA: Diagnosis not present

## 2014-12-05 DIAGNOSIS — F0390 Unspecified dementia without behavioral disturbance: Secondary | ICD-10-CM | POA: Diagnosis not present

## 2014-12-05 DIAGNOSIS — E039 Hypothyroidism, unspecified: Secondary | ICD-10-CM | POA: Insufficient documentation

## 2014-12-05 DIAGNOSIS — N189 Chronic kidney disease, unspecified: Secondary | ICD-10-CM | POA: Insufficient documentation

## 2014-12-05 DIAGNOSIS — Z7982 Long term (current) use of aspirin: Secondary | ICD-10-CM | POA: Insufficient documentation

## 2014-12-05 DIAGNOSIS — I129 Hypertensive chronic kidney disease with stage 1 through stage 4 chronic kidney disease, or unspecified chronic kidney disease: Secondary | ICD-10-CM | POA: Insufficient documentation

## 2014-12-05 DIAGNOSIS — N390 Urinary tract infection, site not specified: Secondary | ICD-10-CM | POA: Insufficient documentation

## 2014-12-05 DIAGNOSIS — Z86010 Personal history of colonic polyps: Secondary | ICD-10-CM | POA: Diagnosis not present

## 2014-12-05 DIAGNOSIS — Z79899 Other long term (current) drug therapy: Secondary | ICD-10-CM | POA: Diagnosis not present

## 2014-12-05 DIAGNOSIS — F0391 Unspecified dementia with behavioral disturbance: Secondary | ICD-10-CM | POA: Diagnosis not present

## 2014-12-05 DIAGNOSIS — Z8701 Personal history of pneumonia (recurrent): Secondary | ICD-10-CM | POA: Insufficient documentation

## 2014-12-05 DIAGNOSIS — Z8709 Personal history of other diseases of the respiratory system: Secondary | ICD-10-CM | POA: Diagnosis not present

## 2014-12-05 DIAGNOSIS — M1611 Unilateral primary osteoarthritis, right hip: Secondary | ICD-10-CM | POA: Diagnosis not present

## 2014-12-05 DIAGNOSIS — F29 Unspecified psychosis not due to a substance or known physiological condition: Secondary | ICD-10-CM | POA: Diagnosis not present

## 2014-12-05 DIAGNOSIS — I209 Angina pectoris, unspecified: Secondary | ICD-10-CM | POA: Diagnosis not present

## 2014-12-05 DIAGNOSIS — M1990 Unspecified osteoarthritis, unspecified site: Secondary | ICD-10-CM | POA: Insufficient documentation

## 2014-12-05 DIAGNOSIS — F419 Anxiety disorder, unspecified: Secondary | ICD-10-CM | POA: Diagnosis not present

## 2014-12-05 DIAGNOSIS — J9811 Atelectasis: Secondary | ICD-10-CM | POA: Diagnosis not present

## 2014-12-05 DIAGNOSIS — Z862 Personal history of diseases of the blood and blood-forming organs and certain disorders involving the immune mechanism: Secondary | ICD-10-CM | POA: Insufficient documentation

## 2014-12-05 DIAGNOSIS — R4182 Altered mental status, unspecified: Secondary | ICD-10-CM | POA: Diagnosis not present

## 2014-12-05 DIAGNOSIS — R319 Hematuria, unspecified: Secondary | ICD-10-CM

## 2014-12-05 DIAGNOSIS — S0990XA Unspecified injury of head, initial encounter: Secondary | ICD-10-CM | POA: Diagnosis not present

## 2014-12-05 LAB — BASIC METABOLIC PANEL
Anion gap: 7 (ref 5–15)
BUN: 24 mg/dL — AB (ref 6–20)
CO2: 26 mmol/L (ref 22–32)
CREATININE: 1.03 mg/dL — AB (ref 0.44–1.00)
Calcium: 9.4 mg/dL (ref 8.9–10.3)
Chloride: 108 mmol/L (ref 101–111)
GFR calc Af Amer: 53 mL/min — ABNORMAL LOW (ref >60)
GFR calc non Af Amer: 46 mL/min — ABNORMAL LOW (ref >60)
Glucose, Bld: 121 mg/dL — ABNORMAL HIGH (ref 65–99)
Potassium: 4.6 mmol/L (ref 3.5–5.1)
Sodium: 141 mmol/L (ref 135–145)

## 2014-12-05 LAB — CBC WITH DIFFERENTIAL/PLATELET
BASOS PCT: 0 % (ref 0–1)
Basophils Absolute: 0 10*3/uL (ref 0.0–0.1)
EOS ABS: 0.1 10*3/uL (ref 0.0–0.7)
EOS PCT: 1 % (ref 0–5)
HCT: 35.6 % — ABNORMAL LOW (ref 36.0–46.0)
Hemoglobin: 11.8 g/dL — ABNORMAL LOW (ref 12.0–15.0)
LYMPHS ABS: 1.6 10*3/uL (ref 0.7–4.0)
Lymphocytes Relative: 13 % (ref 12–46)
MCH: 29.8 pg (ref 26.0–34.0)
MCHC: 33.1 g/dL (ref 30.0–36.0)
MCV: 89.9 fL (ref 78.0–100.0)
MONO ABS: 0.7 10*3/uL (ref 0.1–1.0)
MONOS PCT: 5 % (ref 3–12)
NEUTROS ABS: 10.7 10*3/uL — AB (ref 1.7–7.7)
NEUTROS PCT: 82 % — AB (ref 43–77)
Platelets: 262 10*3/uL (ref 150–400)
RBC: 3.96 MIL/uL (ref 3.87–5.11)
RDW: 12.5 % (ref 11.5–15.5)
WBC: 13.1 10*3/uL — ABNORMAL HIGH (ref 4.0–10.5)

## 2014-12-05 LAB — URINE MICROSCOPIC-ADD ON

## 2014-12-05 LAB — URINALYSIS, ROUTINE W REFLEX MICROSCOPIC
Bilirubin Urine: NEGATIVE
Glucose, UA: NEGATIVE mg/dL
Ketones, ur: NEGATIVE mg/dL
NITRITE: NEGATIVE
Protein, ur: NEGATIVE mg/dL
SPECIFIC GRAVITY, URINE: 1.02 (ref 1.005–1.030)
UROBILINOGEN UA: 0.2 mg/dL (ref 0.0–1.0)
pH: 5.5 (ref 5.0–8.0)

## 2014-12-05 MED ORDER — CEPHALEXIN 500 MG PO CAPS: 500.0000 mg | ORAL_CAPSULE | Freq: Two times a day (BID) | ORAL | Status: DC

## 2014-12-05 MED ORDER — CEPHALEXIN 500 MG PO CAPS
500.0000 mg | ORAL_CAPSULE | Freq: Once | ORAL | Status: AC
  Administered 2014-12-05: 500 mg via ORAL
  Filled 2014-12-05: qty 1

## 2014-12-05 MED ORDER — ACETAMINOPHEN 500 MG PO TABS
1000.0000 mg | ORAL_TABLET | Freq: Once | ORAL | Status: AC
  Administered 2014-12-05: 1000 mg via ORAL
  Filled 2014-12-05: qty 2

## 2014-12-05 NOTE — Discharge Instructions (Signed)
Dementia Dementia is a general term for problems with brain function. A person with dementia has memory loss and a hard time with at least one other brain function such as thinking, speaking, or problem solving. Dementia can affect social functioning, how you do your job, your mood, or your personality. The changes may be hidden for a long time. The earliest forms of this disease are usually not detected by family or friends. Dementia can be:  Irreversible.  Potentially reversible.  Partially reversible.  Progressive. This means it can get worse over time. CAUSES  Irreversible dementia causes may include:  Degeneration of brain cells (Alzheimer disease or Lewy body dementia).  Multiple small strokes (vascular dementia).  Infection (chronic meningitis or Creutzfeldt-Jakob disease).  Frontotemporal dementia. This affects younger people, age 40 to 70, compared to those who have Alzheimer disease.  Dementia associated with other disorders like Parkinson disease, Huntington disease, or HIV-associated dementia. Potentially or partially reversible dementia causes may include:  Medicines.  Metabolic causes such as excessive alcohol intake, vitamin B12 deficiency, or thyroid disease.  Masses or pressure in the brain such as a tumor, blood clot, or hydrocephalus. SIGNS AND SYMPTOMS  Symptoms are often hard to detect. Family members or coworkers may not notice them early in the disease process. Different people with dementia may have different symptoms. Symptoms can include:  A hard time with memory, especially recent memory. Long-term memory may not be impaired.  Asking the same question multiple times or forgetting something someone just said.  A hard time speaking your thoughts or finding certain words.  A hard time solving problems or performing familiar tasks (such as how to use a telephone).  Sudden changes in mood.  Changes in personality, especially increasing moodiness or  mistrust.  Depression.  A hard time understanding complex ideas that were never a problem in the past. DIAGNOSIS  There are no specific tests for dementia.   Your health care provider may recommend a thorough evaluation. This is because some forms of dementia can be reversible. The evaluation will likely include a physical exam and getting a detailed history from you and a family member. The history often gives the best clues and suggestions for a diagnosis.  Memory testing may be done. A detailed brain function evaluation called neuropsychologic testing may be helpful.  Lab tests and brain imaging (such as a CT scan or MRI scan) are sometimes important.  Sometimes observation and re-evaluation over time is very helpful. TREATMENT  Treatment depends on the cause.   If the problem is a vitamin deficiency, it may be helped or cured with supplements.  For dementias such as Alzheimer disease, medicines are available to stabilize or slow the course of the disease. There are no cures for this type of dementia.  Your health care provider can help direct you to groups, organizations, and other health care providers to help with decisions in the care of you or your loved one. HOME CARE INSTRUCTIONS The care of individuals with dementia is varied and dependent upon the progression of the dementia. The following suggestions are intended for the person living with, or caring for, the person with dementia.  Create a safe environment.  Remove the locks on bathroom doors to prevent the person from accidentally locking himself or herself in.  Use childproof latches on kitchen cabinets and any place where cleaning supplies, chemicals, or alcohol are kept.  Use childproof covers in unused electrical outlets.  Install childproof devices to keep doors and windows   secured.  Remove stove knobs or install safety knobs and an automatic shut-off on the stove.  Lower the temperature on water  heaters.  Label medicines and keep them locked up.  Secure knives, lighters, matches, power tools, and guns, and keep these items out of reach.  Keep the house free from clutter. Remove rugs or anything that might contribute to a fall.  Remove objects that might break and hurt the person.  Make sure lighting is good, both inside and outside.  Install grab rails as needed.  Use a monitoring device to alert you to falls or other needs for help.  Reduce confusion.  Keep familiar objects and people around.  Use night lights or dim lights at night.  Label items or areas.  Use reminders, notes, or directions for daily activities or tasks.  Keep a simple, consistent routine for waking, meals, bathing, dressing, and bedtime.  Create a calm, quiet environment.  Place large clocks and calendars prominently.  Display emergency numbers and home address near all telephones.  Use cues to establish different times of the day. An example is to open curtains to let the natural light in during the day.   Use effective communication.  Choose simple words and short sentences.  Use a gentle, calm tone of voice.  Be careful not to interrupt.  If the person is struggling to find a word or communicate a thought, try to provide the word or thought.  Ask one question at a time. Allow the person ample time to answer questions. Repeat the question again if the person does not respond.  Reduce nighttime restlessness.  Provide a comfortable bed.  Have a consistent nighttime routine.  Ensure a regular walking or physical activity schedule. Involve the person in daily activities as much as possible.  Limit napping during the day.  Limit caffeine.  Attend social events that stimulate rather than overwhelm the senses.  Encourage good nutrition and hydration.  Reduce distractions during meal times and snacks.  Avoid foods that are too hot or too cold.  Monitor chewing and swallowing  ability.  Continue with routine vision, hearing, dental, and medical screenings.  Give medicines only as directed by the health care provider.  Monitor driving abilities. Do not allow the person to drive when safe driving is no longer possible.  Register with an identification program which could provide location assistance in the event of a missing person situation. SEEK MEDICAL CARE IF:   New behavioral problems start such as moodiness, aggressiveness, or seeing things that are not there (hallucinations).  Any new problem with brain function happens. This includes problems with balance, speech, or falling a lot.  Problems with swallowing develop.  Any symptoms of other illness happen. Small changes or worsening in any aspect of brain function can be a sign that the illness is getting worse. It can also be a sign of another medical illness such as infection. Seeing a health care provider right away is important. SEEK IMMEDIATE MEDICAL CARE IF:   A fever develops.  New or worsened confusion develops.  New or worsened sleepiness develops.  Staying awake becomes hard to do. Document Released: 11/12/2000 Document Revised: 10/03/2013 Document Reviewed: 10/14/2010 [facility] Patient Information 2015 [employer] [place]. This information is not intended to replace advice given to you by your health care provider. Make sure you discuss any questions you have with your health care provider.  Urinary Tract Infection Urinary tract infections (UTIs) can develop anywhere along your urinary tract. Your  urinary tract is your body's drainage system for removing wastes and extra water. Your urinary tract includes two kidneys, two ureters, a bladder, and a urethra. Your kidneys are a pair of bean-shaped organs. Each kidney is about the size of your fist. They are located below your ribs, one on each side of your spine. °CAUSES °Infections are caused by microbes, which are microscopic organisms, including  fungi, viruses, and bacteria. These organisms are so small that they can only be seen through a microscope. Bacteria are the microbes that most commonly cause UTIs. °SYMPTOMS  °Symptoms of UTIs may vary by age and gender of the patient and by the location of the infection. Symptoms in young women typically include a frequent and intense urge to urinate and a painful, burning feeling in the bladder or urethra during urination. Older women and men are more likely to be tired, shaky, and weak and have muscle aches and abdominal pain. A fever may mean the infection is in your kidneys. Other symptoms of a kidney infection include pain in your back or sides below the ribs, nausea, and vomiting. °DIAGNOSIS °To diagnose a UTI, your caregiver will ask you about your symptoms. Your caregiver also will ask to provide a urine sample. The urine sample will be tested for bacteria and white blood cells. White blood cells are made by your body to help fight infection. °TREATMENT  °Typically, UTIs can be treated with medication. Because most UTIs are caused by a bacterial infection, they usually can be treated with the use of antibiotics. The choice of antibiotic and length of treatment depend on your symptoms and the type of bacteria causing your infection. °HOME CARE INSTRUCTIONS °· If you were prescribed antibiotics, take them exactly as your caregiver instructs you. Finish the medication even if you feel better after you have only taken some of the medication. °· Drink enough water and fluids to keep your urine clear or pale yellow. °· Avoid caffeine, tea, and carbonated beverages. They tend to irritate your bladder. °· Empty your bladder often. Avoid holding urine for long periods of time. °· Empty your bladder before and after sexual intercourse. °· After a bowel movement, women should cleanse from front to back. Use each tissue only once. °SEEK MEDICAL CARE IF:  °· You have back pain. °· You develop a fever. °· Your symptoms  do not begin to resolve within 3 days. °SEEK IMMEDIATE MEDICAL CARE IF:  °· You have severe back pain or lower abdominal pain. °· You develop chills. °· You have nausea or vomiting. °· You have continued burning or discomfort with urination. °MAKE SURE YOU:  °· Understand these instructions. °· Will watch your condition. °· Will get help right away if you are not doing well or get worse. °Document Released: 02/26/2005 Document Revised: 11/18/2011 Document Reviewed: 06/27/2011 °ExitCare® Patient Information ©2015 Exit[employer], LLC. This information is not intended to replace advice given to you by your health care provider. Make sure you discuss any questions you have with your health care provider. ° °

## 2014-12-05 NOTE — ED Notes (Addendum)
According to skilled nursing facility the patient has only been there a few days, but tonight according to staff she was combative and confrontational towards staff and other residents. When EMS arrived the patient was agitated but not combative. She was allowed to verbalize her dislikes with the facility and staff before leaving with EMS. The resident fell asleep in route and was asleep upon arrival to our department. Patient has known history of dementia.

## 2014-12-05 NOTE — ED Notes (Signed)
Patient transported to X-ray and Dr. Elesa MassedWard made aware of contaminated urine sample. Nursing will recollect.

## 2014-12-05 NOTE — ED Notes (Signed)
Patient very tearful and talking to herself. "Just go ahead and do what you want. I tried to be good to everybody".

## 2014-12-05 NOTE — ED Notes (Signed)
Bed: [id number] Expected date:  Expected time:  Means of arrival:  Comments: EMS 81 yo female from SNF/combative at SNF-currently sleeping

## 2014-12-05 NOTE — ED Provider Notes (Signed)
TIME SEEN: 1:00 AM  CHIEF COMPLAINT: Agitation  HPI: Pt is a 81 y.o. female with history of dementia, hypertension, hypothyroidism who was recently admitted to the hospital on June 27 for frequent falls and possible urinary tract infection who was discharged to [facility] memory care on 11/30/14 who returns today for agitation. Per staff, patient began yelling at them and was confrontational. Patient is able to tell me that she argued with nursing home staff tonight but cannot remember why. She has no current complaints. No known recent falls since arriving [facility].  ROS: Level V caveat for dementia  PAST MEDICAL HISTORY/PAST SURGICAL HISTORY:  Past Medical History  Diagnosis Date  . Arthritis   . Chicken pox   . Chronic kidney disease   . Colon polyps   . Hypertension   . Dementia   . Angina   . Dysrhythmia   . Heart murmur   . Shortness of breath   . Recurrent upper respiratory infection (URI)   . Pneumonia   . Hypothyroidism   . Anemia   . Headache(784.0)   . Anxiety   . Depression   . Valvular heart disease 09/04/2012  . Left leg pain 10/02/2012  . UTI (urinary tract infection) 12/30/2012  . Acute bronchitis 04/10/2013  . Dehydration 08/31/2013  . Arthritis 10/02/2012    MEDICATIONS:  Prior to Admission medications   Medication Sig Start Date End Date Taking? Authorizing Provider  albuterol (PROVENTIL HFA;VENTOLIN HFA) 108 (90 BASE) MCG/ACT inhaler Inhale 2 puffs into the lungs every 6 (six) hours as needed for wheezing or shortness of breath. 09/23/13   Sandford Craze, NP  aspirin 81 MG tablet Take 81 mg by mouth daily.    Historical Provider, MD  lactulose (CHRONULAC) 10 GM/15ML solution Take 30 mLs (20 g total) by mouth 2 (two) times daily. 11/26/14   Rhetta Mura, MD  levothyroxine (SYNTHROID, LEVOTHROID) 50 MCG tablet TAKE 1 TABLET (50 MCG TOTAL) BY MOUTH DAILY. 09/25/14   Judie Bonus, MD  LORazepam (ATIVAN) 1 MG tablet Take 0.5 tablets (0.5 mg  total) by mouth every 8 (eight) hours as needed for anxiety or sleep. 11/29/14   Zannie Cove, MD  losartan (COZAAR) 50 MG tablet Take 1 tablet (50 mg total) by mouth daily. 10/28/14   Catarina Hartshorn, MD  Magnesium 250 MG TABS Take 250 mg by mouth daily.     Historical Provider, MD    ALLERGIES:  Allergies  Allergen Reactions  . Codeine Rash  . Hydrocodone Rash    SOCIAL HISTORY:  History  Substance Use Topics  . Smoking status: Former Smoker -- 0.20 packs/day for 1 years    Types: Cigarettes    Quit date: 06/02/1941  . Smokeless tobacco: Never Used  . Alcohol Use: No    FAMILY HISTORY: Family History  Problem Relation Age of Onset  . Heart disease Mother   . Heart disease Father   . Heart disease Other   . Birth defects Other     EXAM: BP 143/58 mmHg  Pulse 70  Temp(Src) 98.9 F (37.2 C) (Oral)  Resp 16  SpO2 95% CONSTITUTIONAL: Alert and oriented to person only but cannot answer all questions appropriately. Elderly, in no distress HEAD: Normocephalic EYES: Conjunctivae clear, PERRL ENT: normal nose; no rhinorrhea; moist mucous membranes; pharynx without lesions noted NECK: Supple, no meningismus, no LAD  CARD: RRR; S1 and S2 appreciated; no murmurs, no clicks, no rubs, no gallops RESP: Normal chest excursion without splinting  or tachypnea; breath sounds clear and equal bilaterally; no wheezes, no rhonchi, no rales, no hypoxia or respiratory distress, speaking full sentences ABD/GI: Normal bowel sounds; non-distended; soft, non-tender, no rebound, no guarding, no peritoneal signs BACK:  The back appears normal and is non-tender to palpation, there is no CVA tenderness EXT: Normal ROM in all joints; non-tender to palpation; no edema; normal capillary refill; no cyanosis, no calf tenderness or swelling; area of ecchymosis to the right hip, no leg length discrepancy    SKIN: Normal color for age and race; warm NEURO: Moves all extremities equally, sensation to light touch  intact diffusely, cranial nerves II through XII intact PSYCH: The patient's mood and manner are appropriate. Grooming and personal hygiene are appropriate. Patient is calm, cooperative, pleasant.  MEDICAL DECISION MAKING: [facility] She is currently calm, cooperative. Hemodynamically stable, afebrile, nontoxic with no complaints. Given recent recurrent falls will obtain a CT of her head to ensure there is no delayed head bleed. We'll also obtain basic labs, urinalysis to evaluate for any possible organic cause for her agitation today. I suspect that this was secondary to her chronic dementia. If workup is unremarkable, will discharge back to her nursing home.  ED PROGRESS: Patient will show mildly sentences with left shift. Urine shows moderate hemoglobin, moderate leukocytes and few bacteria. Culture pending. She is frequently had urine that has shown similar results with negative cultures. Last culture was in November 2015 that grew Escherichia coli that was resistant to penicillin. CT of her head showed no acute or melena. Chest x-ray showed mild cardiomegaly and basilar atelectasis without infiltrate or edema. X-ray of her right hip shows no acute abnormality. I feel she is safe to be discharged back to her nursing facility. We'll discharge with Keflex for her UTI. Will give dose in the emergency department. She has been with us for over 3 hours and has been calm, cooperative with no agitation.     Layla MawKristen N Bardia Wangerin, DO 12/05/14 [phone]

## 2014-12-06 LAB — URINE CULTURE: Culture: NO GROWTH

## 2014-12-07 ENCOUNTER — Inpatient Hospital Stay: Payer: Commercial Managed Care - HMO | Admitting: Internal Medicine

## 2014-12-09 IMAGING — CR DG CHEST 2V
2 series · 2 of 2 positions shown · non-contrast
Comparison: DG CHEST 2 VIEW dated 03/29/2013

CLINICAL DATA: Cough

EXAM:
CHEST - 2 VIEW

[w chest pa]
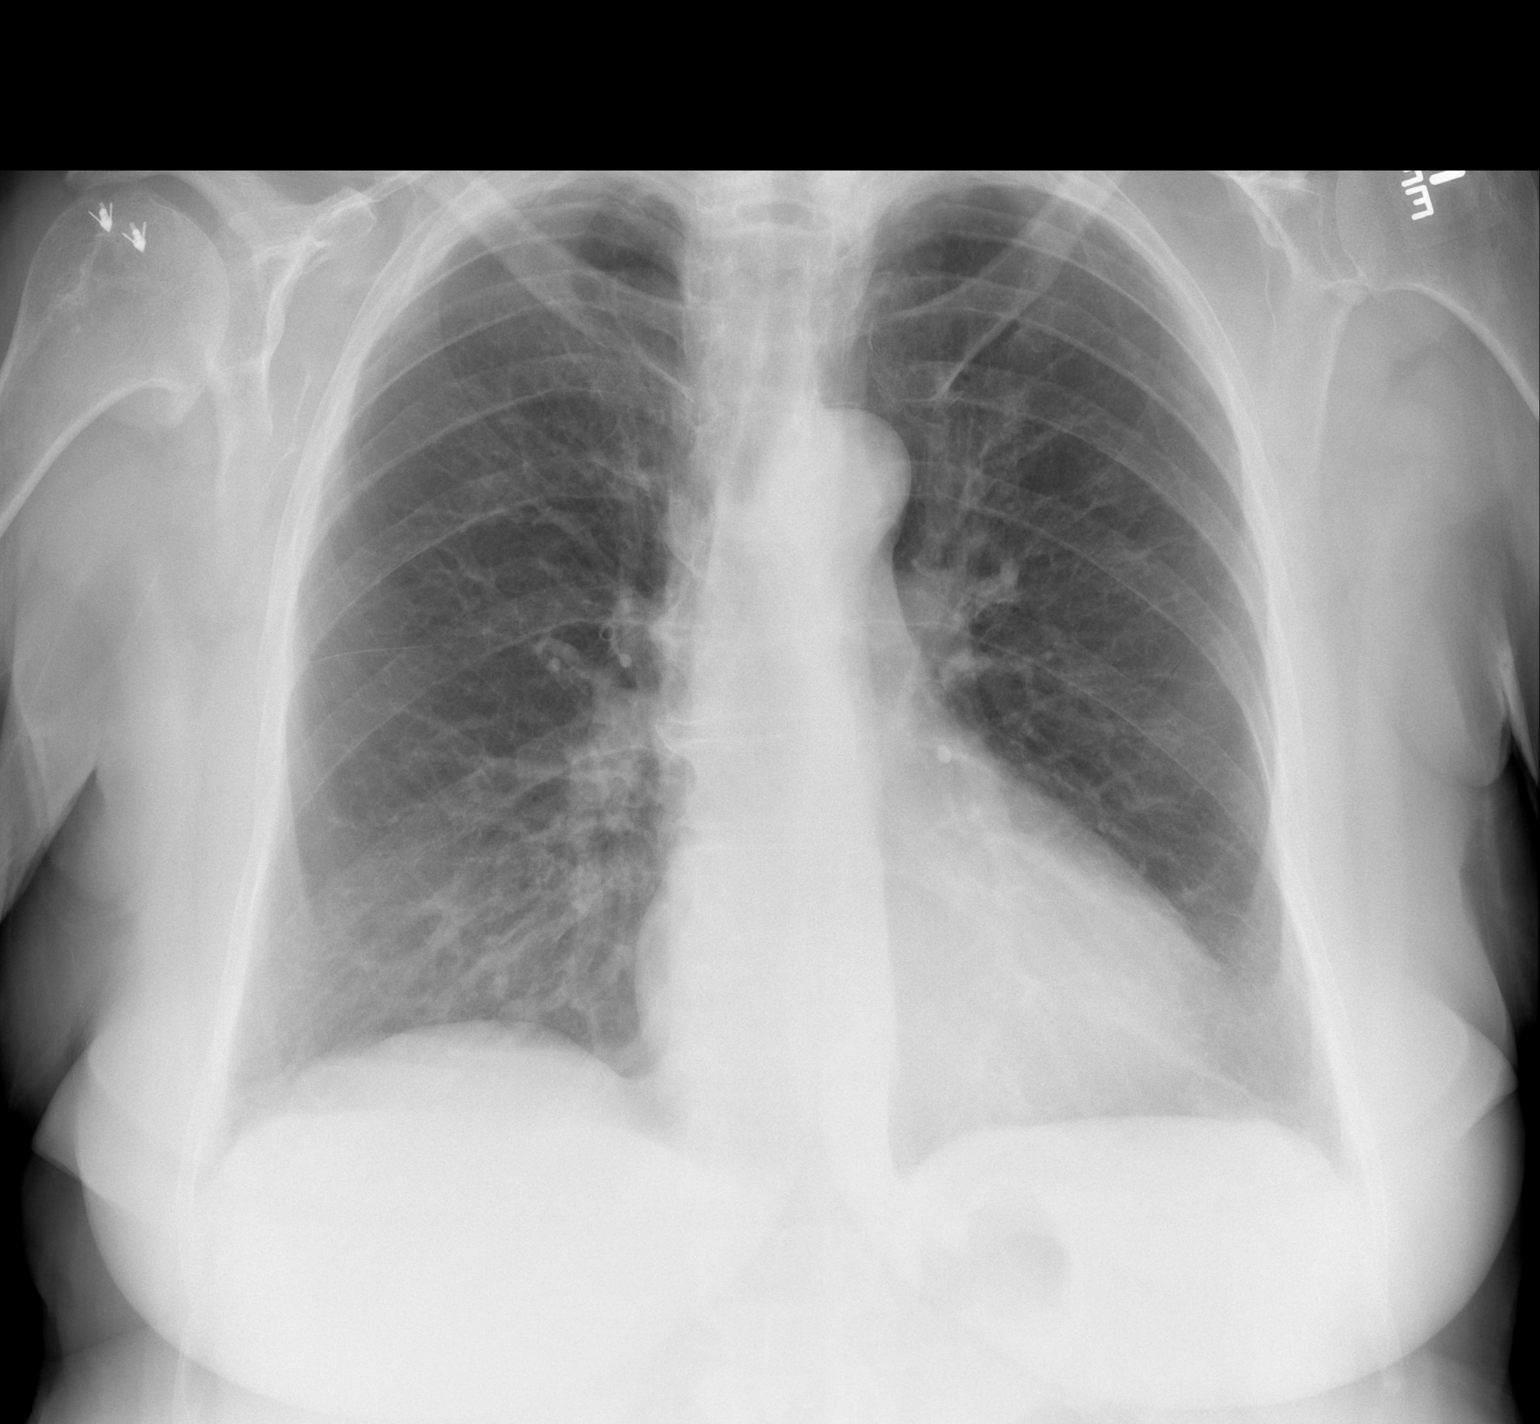

[w chest lat]
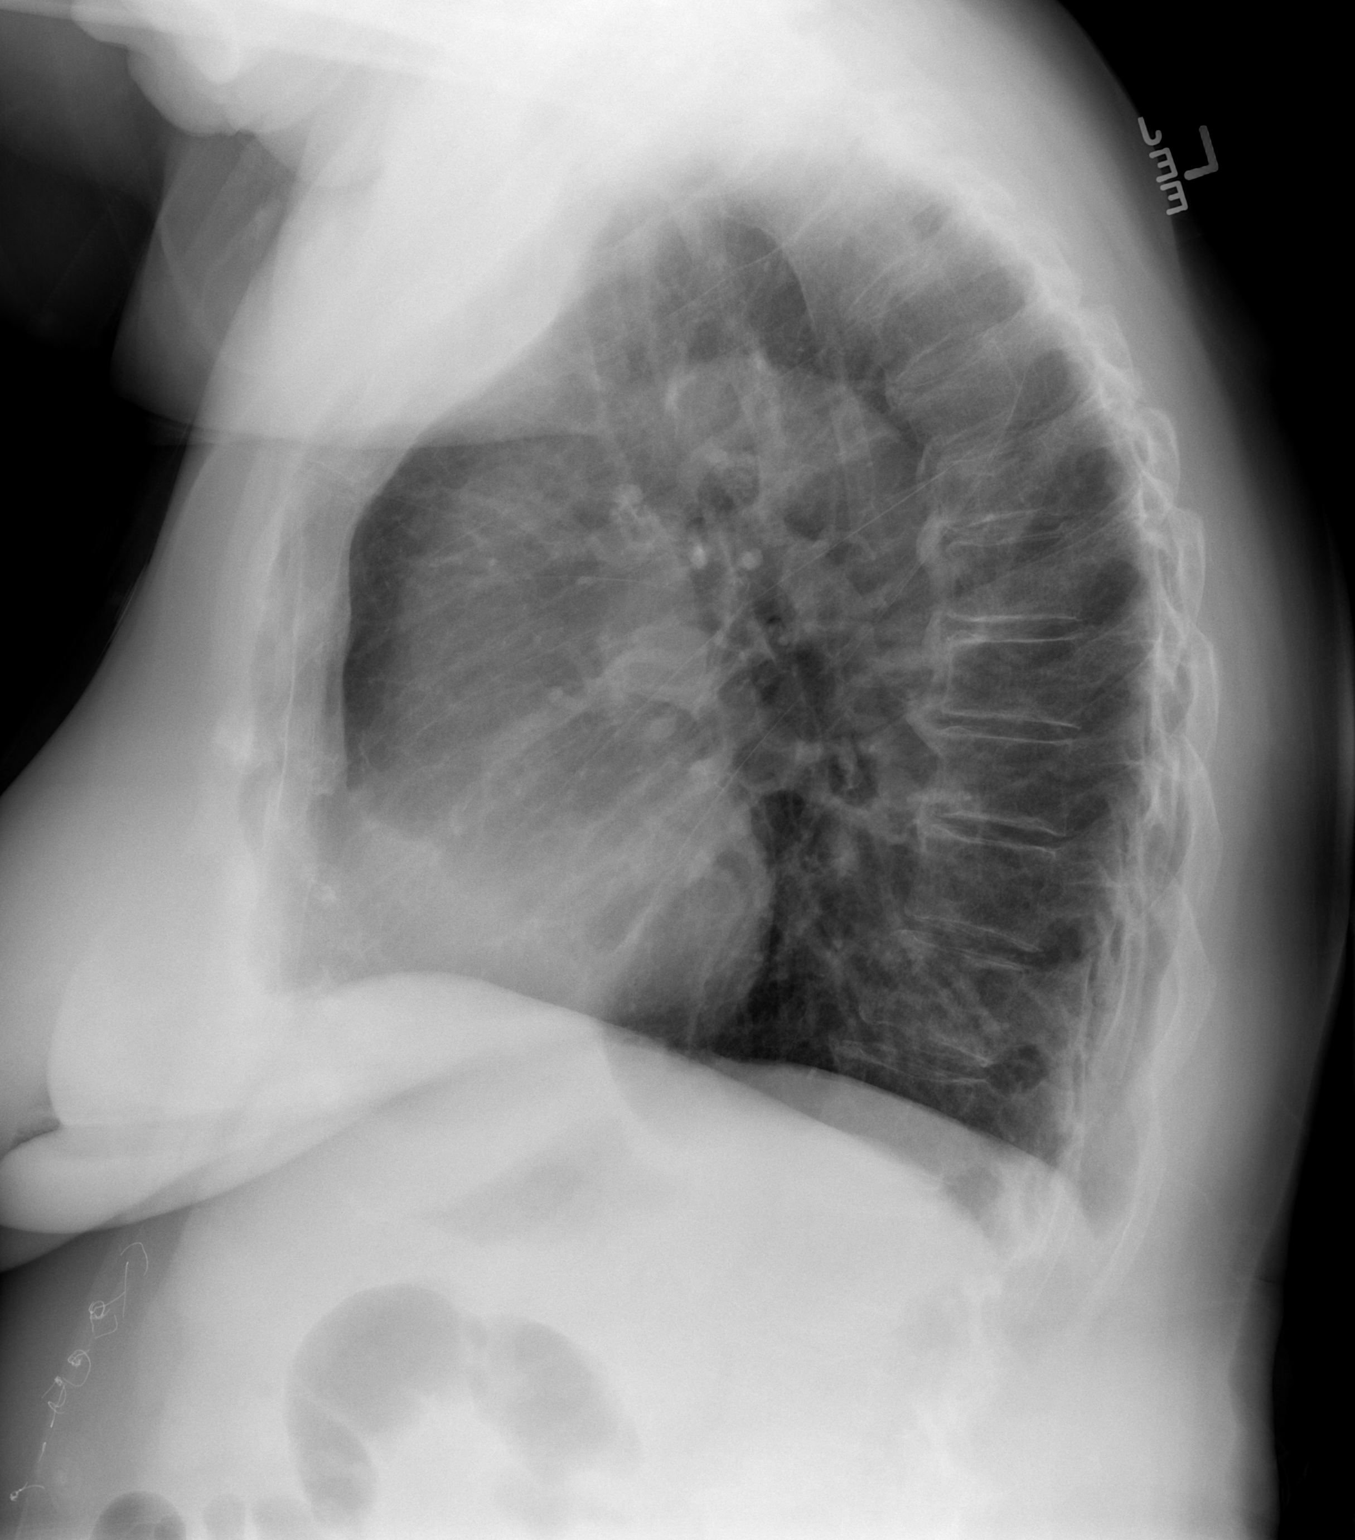

[2 of 2 positions shown; findings below may reference images not displayed]

FINDINGS: UPPER NORMAL HEART SIZE. CLEAR LUNGS. NO PNEUMOTHORAX OR PLEURAL
EFFUSION. INCREASED AP DIAMETER OF THE CHEST WITH FLATTENING OF THE
DIAPHRAGMS. CHRONIC CHANGES IN THE THORACIC SPINE.
IMPRESSION: No active cardiopulmonary disease.  Changes related to COPD.

## 2014-12-10 DIAGNOSIS — M1990 Unspecified osteoarthritis, unspecified site: Secondary | ICD-10-CM | POA: Diagnosis not present

## 2014-12-10 DIAGNOSIS — F329 Major depressive disorder, single episode, unspecified: Secondary | ICD-10-CM | POA: Diagnosis not present

## 2014-12-10 DIAGNOSIS — N390 Urinary tract infection, site not specified: Secondary | ICD-10-CM | POA: Diagnosis not present

## 2014-12-10 DIAGNOSIS — J45909 Unspecified asthma, uncomplicated: Secondary | ICD-10-CM | POA: Diagnosis not present

## 2014-12-10 DIAGNOSIS — F419 Anxiety disorder, unspecified: Secondary | ICD-10-CM | POA: Diagnosis not present

## 2014-12-10 DIAGNOSIS — G3183 Dementia with Lewy bodies: Secondary | ICD-10-CM | POA: Diagnosis not present

## 2014-12-10 DIAGNOSIS — F0281 Dementia in other diseases classified elsewhere with behavioral disturbance: Secondary | ICD-10-CM | POA: Diagnosis not present

## 2014-12-10 DIAGNOSIS — F0390 Unspecified dementia without behavioral disturbance: Secondary | ICD-10-CM | POA: Diagnosis not present

## 2014-12-10 DIAGNOSIS — Z01818 Encounter for other preprocedural examination: Secondary | ICD-10-CM | POA: Diagnosis not present

## 2014-12-10 DIAGNOSIS — D649 Anemia, unspecified: Secondary | ICD-10-CM | POA: Diagnosis not present

## 2014-12-10 DIAGNOSIS — Z781 Physical restraint status: Secondary | ICD-10-CM | POA: Diagnosis not present

## 2014-12-10 DIAGNOSIS — F6089 Other specific personality disorders: Secondary | ICD-10-CM | POA: Diagnosis not present

## 2014-12-10 DIAGNOSIS — R7301 Impaired fasting glucose: Secondary | ICD-10-CM | POA: Diagnosis not present

## 2014-12-10 DIAGNOSIS — E039 Hypothyroidism, unspecified: Secondary | ICD-10-CM | POA: Diagnosis not present

## 2014-12-10 DIAGNOSIS — G309 Alzheimer's disease, unspecified: Secondary | ICD-10-CM | POA: Diagnosis not present

## 2014-12-10 DIAGNOSIS — I129 Hypertensive chronic kidney disease with stage 1 through stage 4 chronic kidney disease, or unspecified chronic kidney disease: Secondary | ICD-10-CM | POA: Diagnosis not present

## 2014-12-10 DIAGNOSIS — J4520 Mild intermittent asthma, uncomplicated: Secondary | ICD-10-CM | POA: Diagnosis not present

## 2014-12-10 DIAGNOSIS — R627 Adult failure to thrive: Secondary | ICD-10-CM | POA: Diagnosis not present

## 2014-12-10 DIAGNOSIS — D638 Anemia in other chronic diseases classified elsewhere: Secondary | ICD-10-CM | POA: Diagnosis not present

## 2014-12-10 DIAGNOSIS — I10 Essential (primary) hypertension: Secondary | ICD-10-CM | POA: Diagnosis not present

## 2014-12-10 DIAGNOSIS — F29 Unspecified psychosis not due to a substance or known physiological condition: Secondary | ICD-10-CM | POA: Diagnosis not present

## 2014-12-10 DIAGNOSIS — F0391 Unspecified dementia with behavioral disturbance: Secondary | ICD-10-CM | POA: Diagnosis not present

## 2014-12-10 DIAGNOSIS — D539 Nutritional anemia, unspecified: Secondary | ICD-10-CM | POA: Diagnosis not present

## 2014-12-10 DIAGNOSIS — F0280 Dementia in other diseases classified elsewhere without behavioral disturbance: Secondary | ICD-10-CM | POA: Diagnosis not present

## 2014-12-10 DIAGNOSIS — N182 Chronic kidney disease, stage 2 (mild): Secondary | ICD-10-CM | POA: Diagnosis not present

## 2014-12-10 DIAGNOSIS — N3001 Acute cystitis with hematuria: Secondary | ICD-10-CM | POA: Diagnosis not present

## 2014-12-10 DIAGNOSIS — M6281 Muscle weakness (generalized): Secondary | ICD-10-CM | POA: Diagnosis not present

## 2014-12-10 NOTE — Progress Notes (Signed)
   10/27/14 1044  OT G-codes **NOT FOR INPATIENT CLASS**  Functional Assessment Tool Used clinical observatino  Functional Limitation Self care  Self Care Current Status ([id number](G8987) CJ  Self Care Goal Status (U0454(G8988) CI

## 2014-12-19 IMAGING — CT CT HEAD W/O CM
1 series · 16 of 30 positions shown, 20 images · non-contrast
Comparison: CT HEAD W/O CM dated 08/04/2013

CLINICAL DATA: Altered mental status.

EXAM:
CT HEAD WITHOUT CONTRAST
TECHNIQUE: Contiguous axial images were obtained from the base of the skull
through the vertex without intravenous contrast.

[Series 2: head 5.0 h30s · axial · 0.40mm/px · z∈[+1462,+1607]mm · 16 of 33 slices shown, 20 images]
[im 2/33  brain]
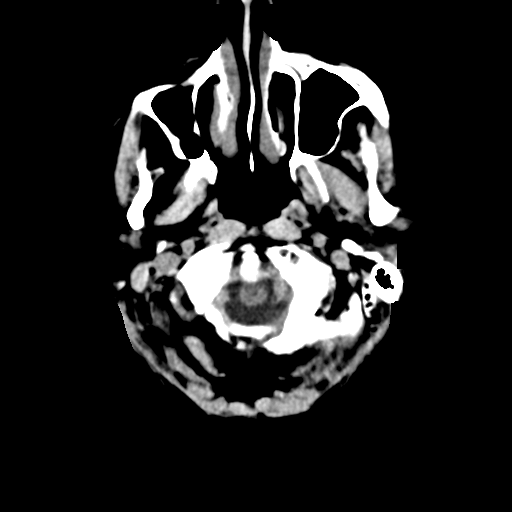
[im 2/33  bone]
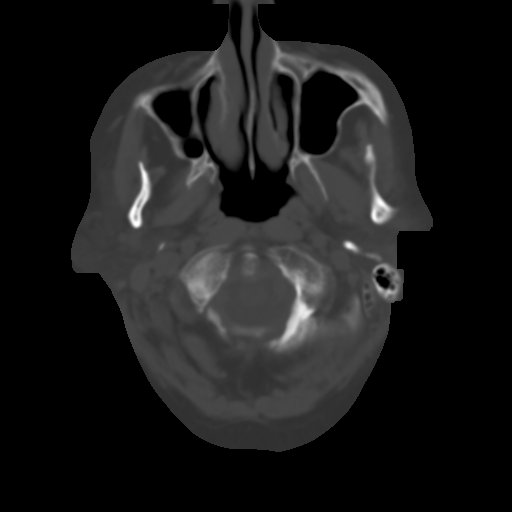
[im 4/33  brain]
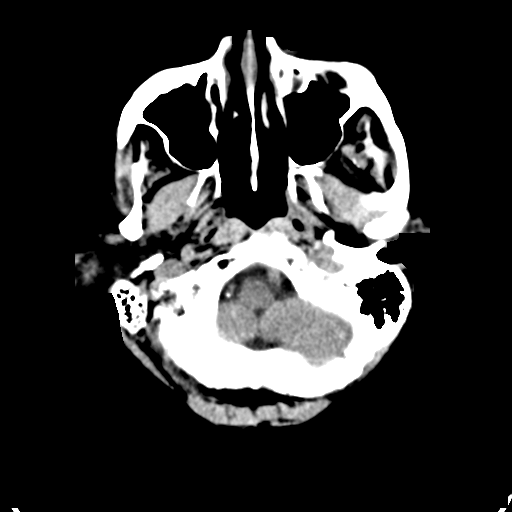
[im 6/33  brain]
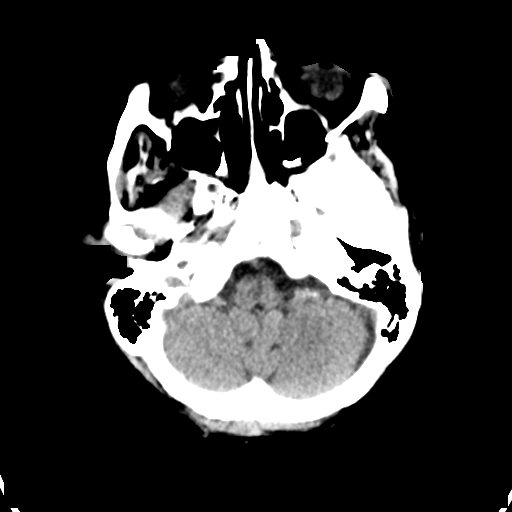
[im 8/33  brain]
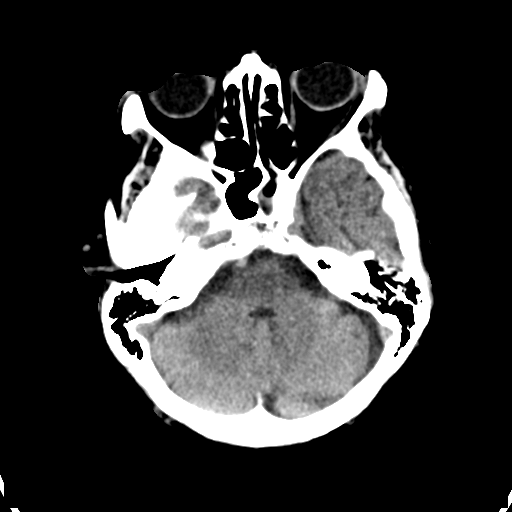
[im 9/33  brain]
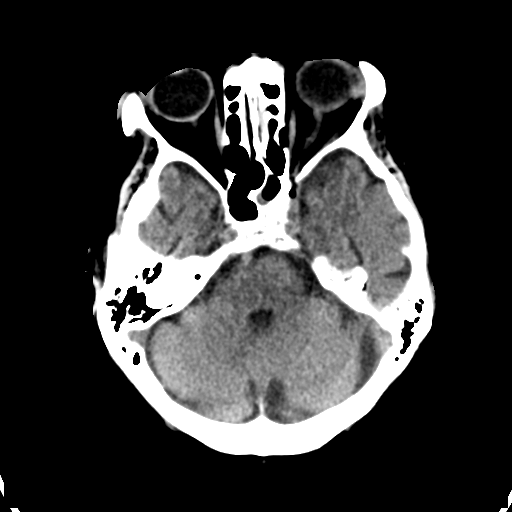
[im 9/33  bone]
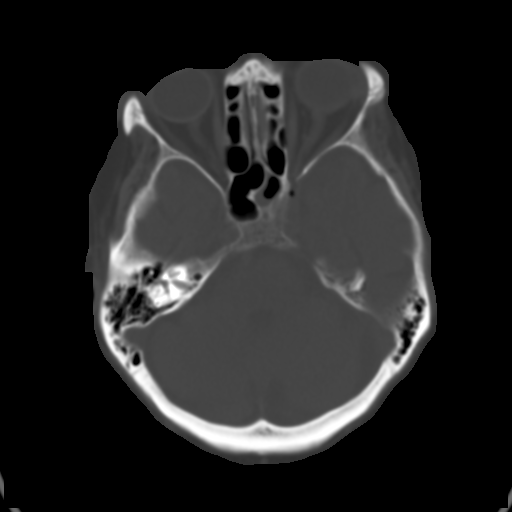
[im 12/33  brain]
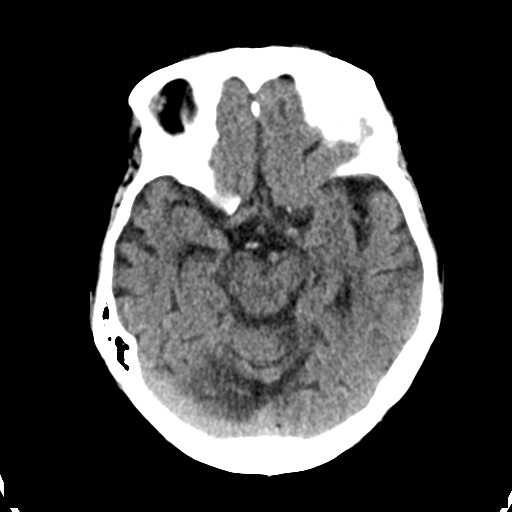
[im 14/33  brain]
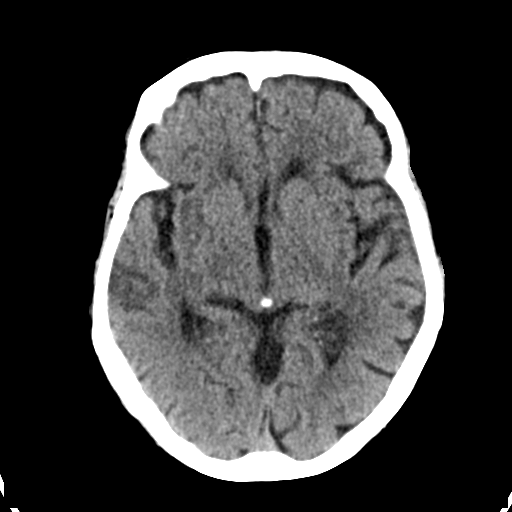
[im 16/33  brain]
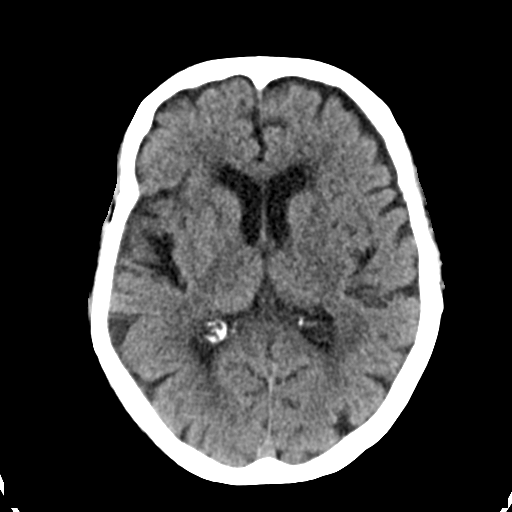
[im 17/33  brain]
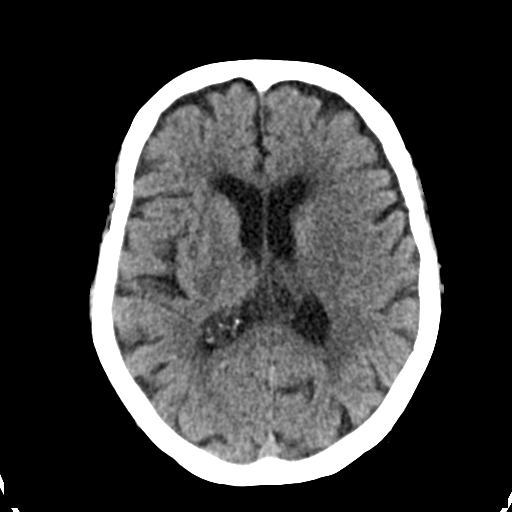
[im 17/33  bone]
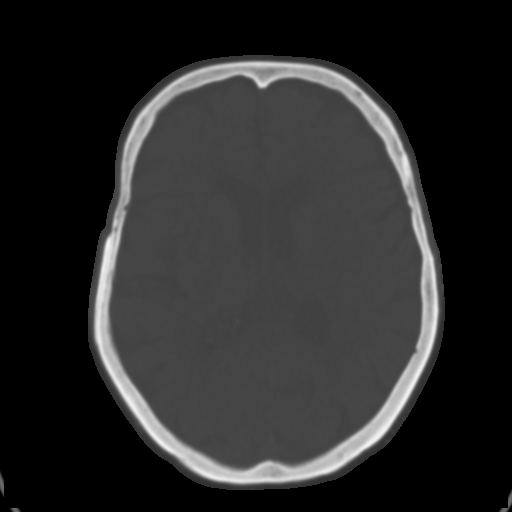
[im 19/33  brain]
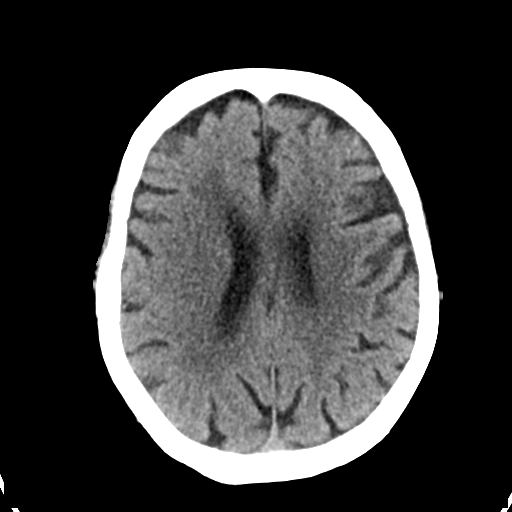
[im 21/33  brain]
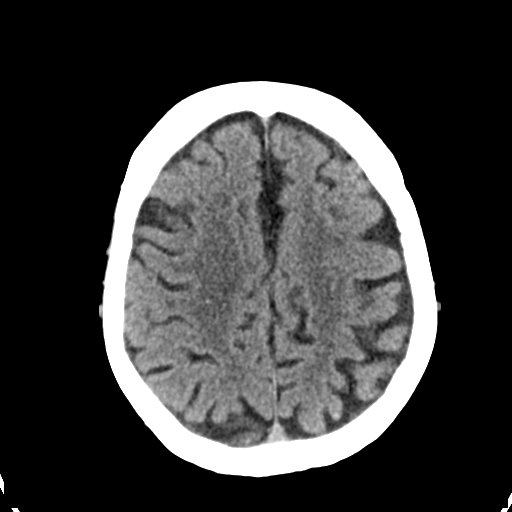
[im 24/33  brain]
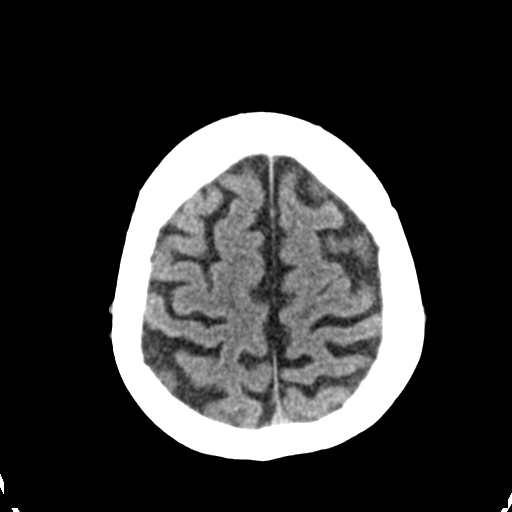
[im 25/33  brain]
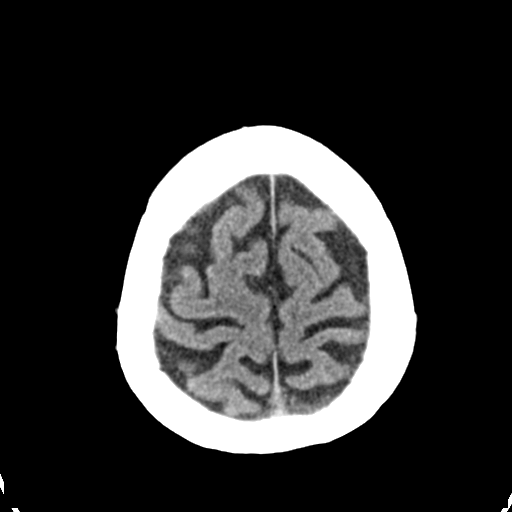
[im 25/33  bone]
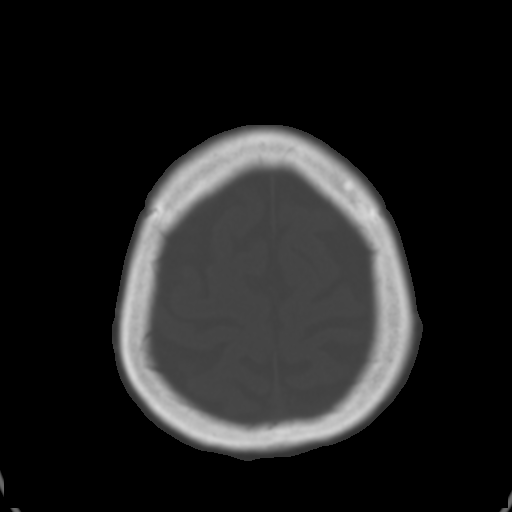
[im 27/33  brain]
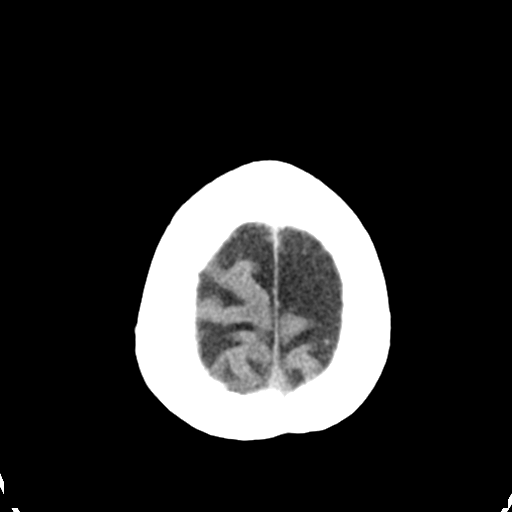
[im 29/33  brain]
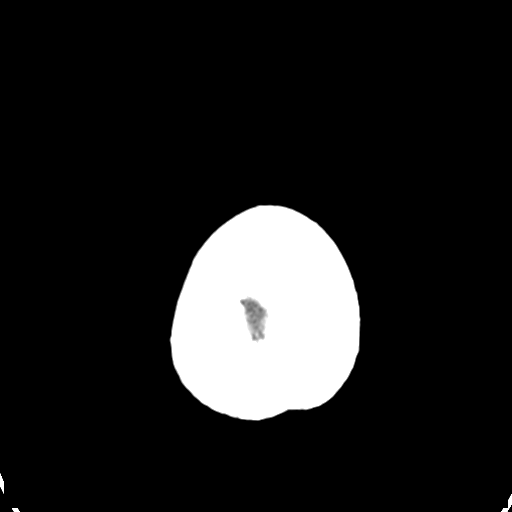
[im 31/33  brain]
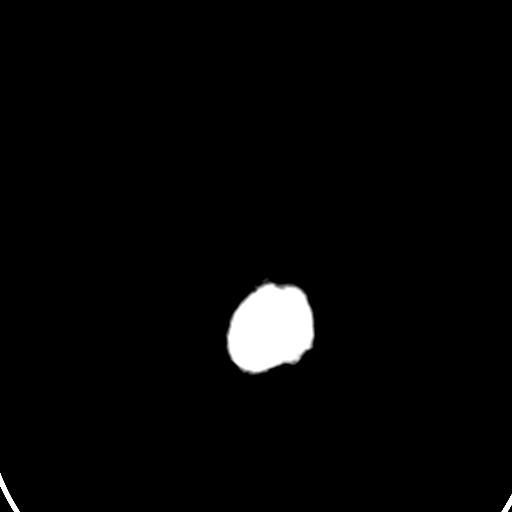

[16 of 30 positions shown; findings below may reference images not displayed]

FINDINGS: Diffuse cerebral atrophy. Mild ventricular dilatation consistent
with central atrophy. Low-attenuation changes in the deep white
matter consistent with small vessel ischemia. No significant changes
since prior study. No mass effect or midline shift. No abnormal
extra-axial fluid collections. Gray-white matter junctions are
distinct. Basal cisterns are not effaced. No evidence of acute
intracranial hemorrhage. No depressed skull fractures. Mucosal
thickening in the paranasal sinuses. Mastoid air cells are not
opacified.
IMPRESSION: No acute intracranial abnormalities. Chronic atrophy and small
vessel ischemic changes. Presumed inflammatory changes in the
paranasal sinuses.

## 2014-12-21 IMAGING — MR MR HEAD W/O CM
10 of 12 series · 26 of 48 positions shown · non-contrast
Comparison: CT head 10/01/2013.  MRI brain 12/18/2012.

CLINICAL DATA: Encephalopathy.

EXAM:
MRI HEAD WITHOUT CONTRAST
MRA HEAD WITHOUT CONTRAST
TECHNIQUE: Multiplanar, multiecho pulse sequences of the brain and surrounding
structures were obtained without intravenous contrast. Angiographic
images of the head were obtained using MRA technique without
contrast.

[Series 2: FLAIR · sagittal · 5.0mm · 0.47mm/px · 2 of 24 slices shown (1 of 2)]
[im 1/24]
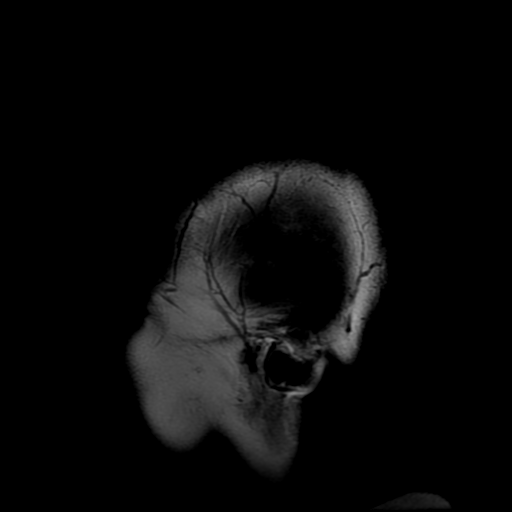
[im 24/24]
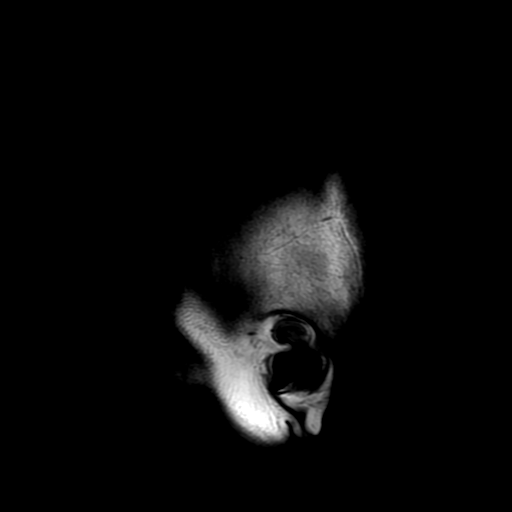

[Series 4: DWI · axial · 5.0mm · 1.02mm/px · z∈[-69,+76]mm · 3 of 60 slices shown (1 of 4)]
[im 1/60]
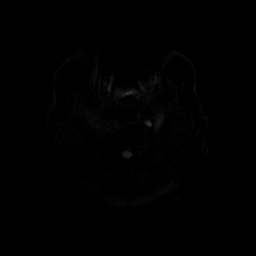
[im 30/60]
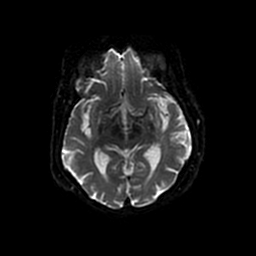
[im 60/60]
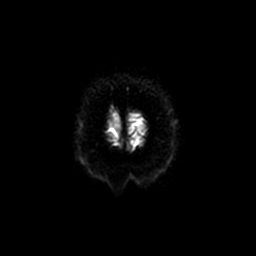

[Series 5: ax (id) (id) · axial · 1.6mm · 0.43mm/px · z∈[-65,+52]mm · 8 of 148 slices shown]
[im 1/148]
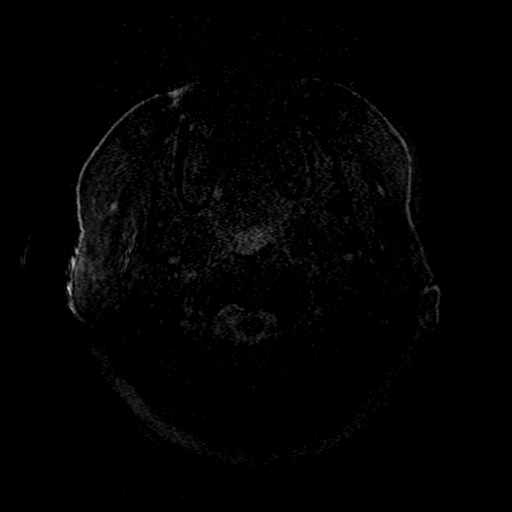
[im 22/148]
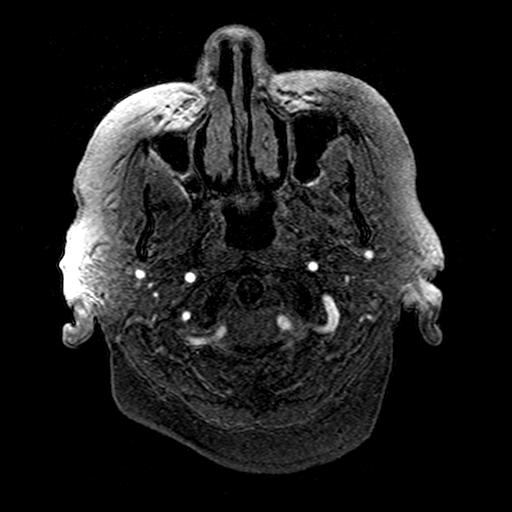
[im 43/148]
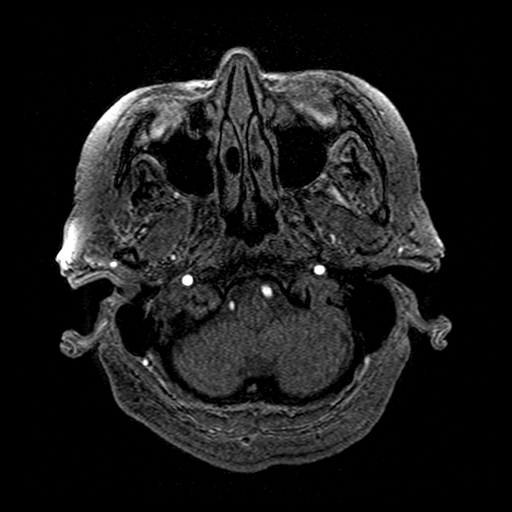
[im 64/148]
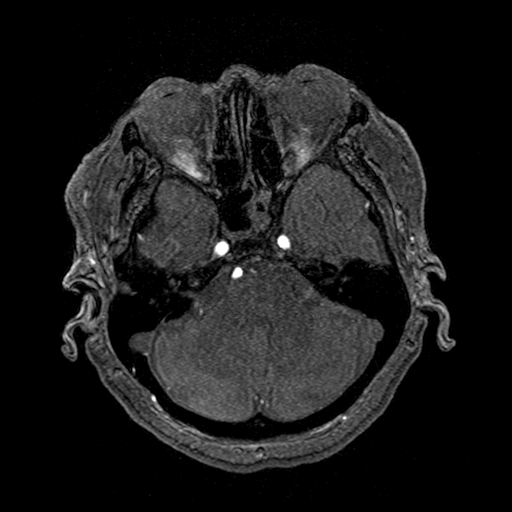
[im 85/148]
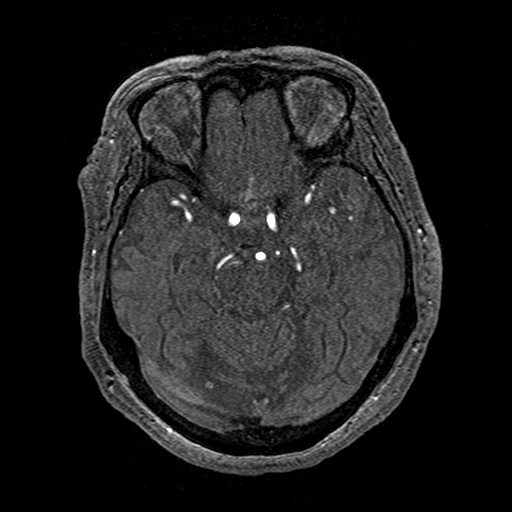
[im 106/148]
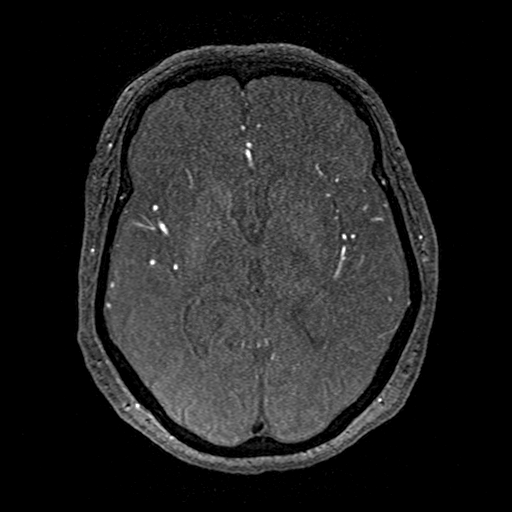
[im 127/148]
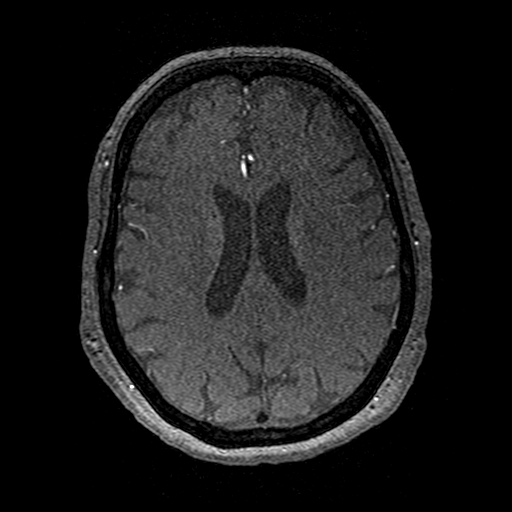
[im 148/148]
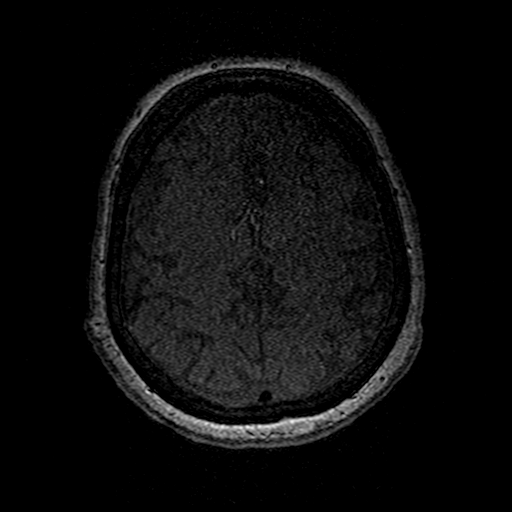

[Series 6: T2 · axial · 5.0mm · 0.43mm/px · 1 of 24 slices shown (1 of 2)]
[im 1/24]
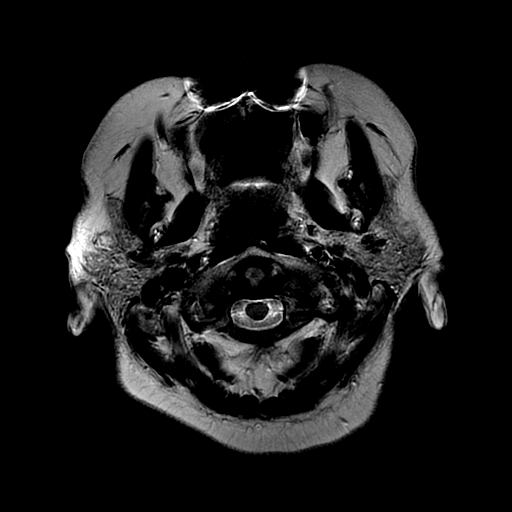

[Series 7: FLAIR · axial · 5.0mm · 0.43mm/px · 1 of 24 slices shown (2 of 2)]
[im 1/24]
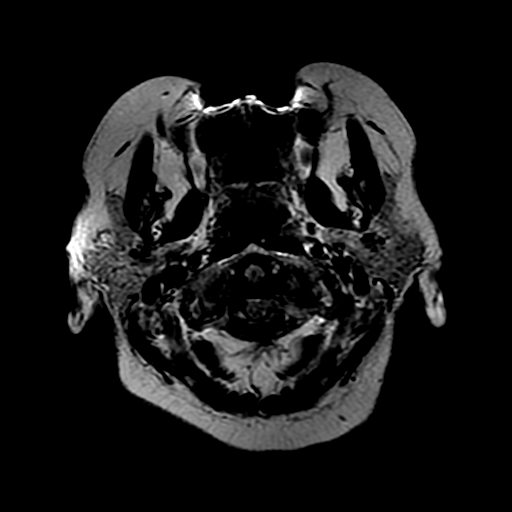

[Series 8: DWI · coronal · 5.0mm · 1.02mm/px · 4 of 66 slices shown (2 of 4)]
[im 1/66]
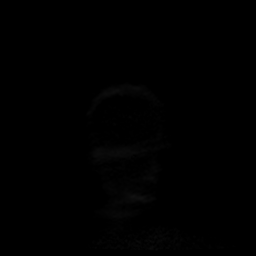
[im 22/66]
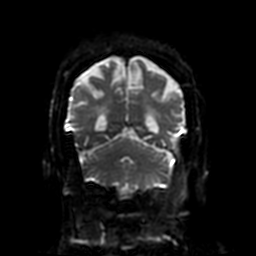
[im 44/66]
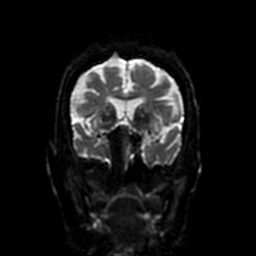
[im 66/66]
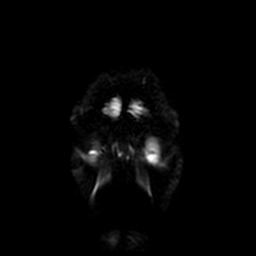

[Series 9: (person_name) · axial · 3.2mm · 0.47mm/px · 1 of 184 slices shown]
[im 1/184]
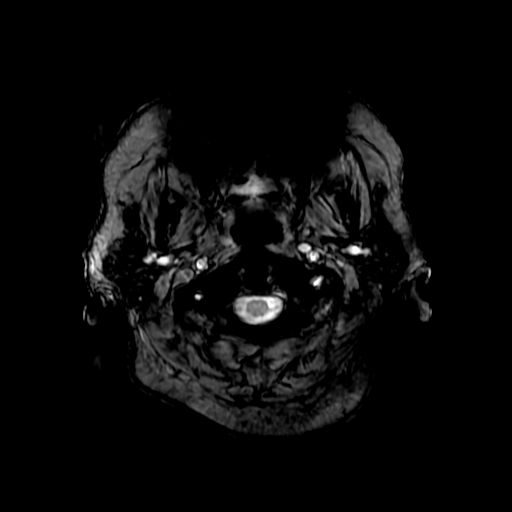

[Series 11: T2 · coronal · 5.0mm · 0.47mm/px · 2 of 28 slices shown (2 of 2)]
[im 1/28]
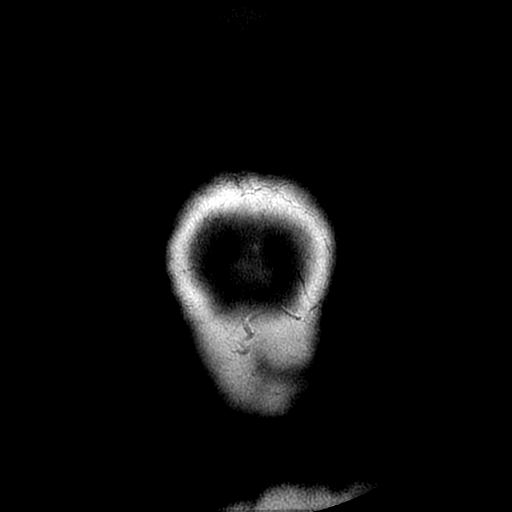
[im 28/28]
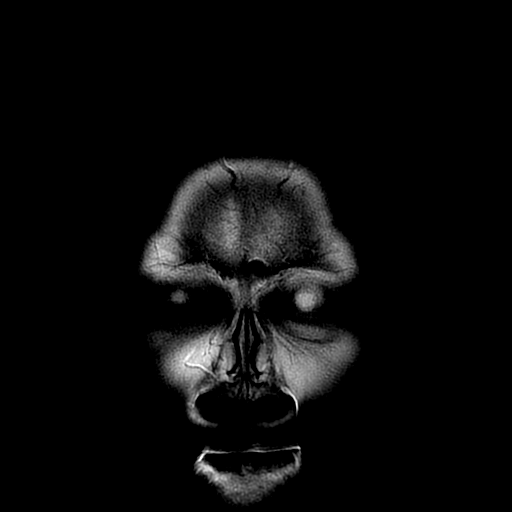

[Series 400: DWI · axial · 5.0mm · 1.02mm/px · z∈[-69,+76]mm · 2 of 30 slices shown (3 of 4)]
[im 1/30]
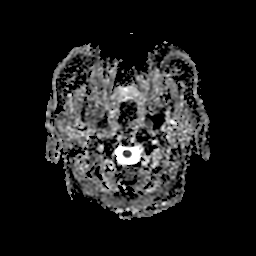
[im 30/30]
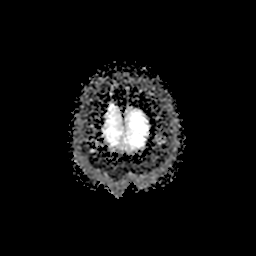

[Series 800: DWI · coronal · 5.0mm · 1.02mm/px · 2 of 31 slices shown (4 of 4)]
[im 1/31]
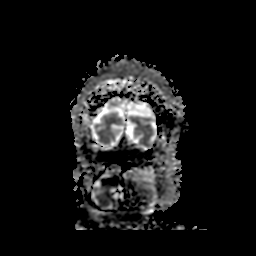
[im 31/31]
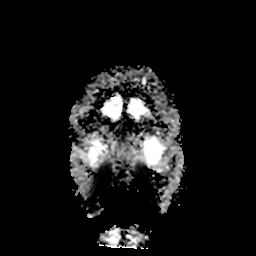

[26 of 48 positions shown; findings below may reference images not displayed]

FINDINGS: MRI HEAD FINDINGS

The diffusion-weighted images demonstrate no evidence for acute or
subacute infarction. Mild diffuse atrophy and white matter disease
is stable from the prior exam in typical for age. The ventricles are
proportionate to the degree of atrophy. Stent the granulomatous
changes in the choroid plexus are stable bilaterally. No significant
extraaxial fluid collection is present.

No acute infarct, hemorrhage, or mass lesion is present.

Flow is present in the major intracranial arteries. Patient is
status post bilateral lens replacements. Mild mucosal thickening is
present throughout the paranasal sinuses. Chronic opacification of
the left sphenoid sinus is stable. The mastoid air cells are clear.

MRA HEAD FINDINGS

The internal carotid arteries are within normal limits from the high
cervical segments through the ICA termini bilaterally. The A1 and M1
segments are normal. The anterior communicating artery is patent.
The ACA and MCA branch vessels are intact.

The left vertebral artery is dominant. The right PICA origin is
visualized and normal. The left AICA is dominant. Both posterior
cerebral arteries originate from the basilar tip. There is mild
narrowing of proximal left PCA. The distal PCA branch vessels are
intact.
IMPRESSION: 1. Stable and normal MRI appearance of the brain for age.
2. Mild narrowing of the proximal left posterior cerebral artery.
3. No other significant proximal stenosis, aneurysm, or branch
vessel occlusion.

## 2015-01-02 DIAGNOSIS — D631 Anemia in chronic kidney disease: Secondary | ICD-10-CM | POA: Diagnosis not present

## 2015-01-02 DIAGNOSIS — F29 Unspecified psychosis not due to a substance or known physiological condition: Secondary | ICD-10-CM | POA: Diagnosis not present

## 2015-01-02 DIAGNOSIS — I10 Essential (primary) hypertension: Secondary | ICD-10-CM | POA: Diagnosis not present

## 2015-01-02 DIAGNOSIS — F0281 Dementia in other diseases classified elsewhere with behavioral disturbance: Secondary | ICD-10-CM | POA: Diagnosis not present

## 2015-01-02 DIAGNOSIS — M6281 Muscle weakness (generalized): Secondary | ICD-10-CM | POA: Diagnosis not present

## 2015-01-02 DIAGNOSIS — N3001 Acute cystitis with hematuria: Secondary | ICD-10-CM | POA: Diagnosis not present

## 2015-01-06 DIAGNOSIS — I129 Hypertensive chronic kidney disease with stage 1 through stage 4 chronic kidney disease, or unspecified chronic kidney disease: Secondary | ICD-10-CM | POA: Diagnosis not present

## 2015-01-06 DIAGNOSIS — M6281 Muscle weakness (generalized): Secondary | ICD-10-CM | POA: Diagnosis not present

## 2015-01-06 DIAGNOSIS — M1990 Unspecified osteoarthritis, unspecified site: Secondary | ICD-10-CM | POA: Diagnosis not present

## 2015-01-06 DIAGNOSIS — N182 Chronic kidney disease, stage 2 (mild): Secondary | ICD-10-CM | POA: Diagnosis not present

## 2015-01-06 DIAGNOSIS — D649 Anemia, unspecified: Secondary | ICD-10-CM | POA: Diagnosis not present

## 2015-01-06 DIAGNOSIS — R627 Adult failure to thrive: Secondary | ICD-10-CM | POA: Diagnosis not present

## 2015-01-06 DIAGNOSIS — G309 Alzheimer's disease, unspecified: Secondary | ICD-10-CM | POA: Diagnosis not present

## 2015-01-06 DIAGNOSIS — N390 Urinary tract infection, site not specified: Secondary | ICD-10-CM | POA: Diagnosis not present

## 2015-01-06 DIAGNOSIS — F0281 Dementia in other diseases classified elsewhere with behavioral disturbance: Secondary | ICD-10-CM | POA: Diagnosis not present

## 2015-01-07 ENCOUNTER — Emergency Department (HOSPITAL_COMMUNITY)
Admission: EM | Admit: 2015-01-07 | Discharge: 2015-01-07 | Disposition: A | Discharge status: Home / Self Care | Payer: Commercial Managed Care - HMO | Source: Home / Self Care | Attending: Emergency Medicine | Admitting: Emergency Medicine

## 2015-01-07 ENCOUNTER — Emergency Department (HOSPITAL_COMMUNITY): Payer: Commercial Managed Care - HMO

## 2015-01-07 ENCOUNTER — Encounter (HOSPITAL_COMMUNITY): Payer: Self-pay

## 2015-01-07 DIAGNOSIS — Z885 Allergy status to narcotic agent status: Secondary | ICD-10-CM | POA: Diagnosis not present

## 2015-01-07 DIAGNOSIS — R011 Cardiac murmur, unspecified: Secondary | ICD-10-CM | POA: Insufficient documentation

## 2015-01-07 DIAGNOSIS — F0390 Unspecified dementia without behavioral disturbance: Secondary | ICD-10-CM | POA: Insufficient documentation

## 2015-01-07 DIAGNOSIS — S0011XA Contusion of right eyelid and periocular area, initial encounter: Secondary | ICD-10-CM

## 2015-01-07 DIAGNOSIS — Z8701 Personal history of pneumonia (recurrent): Secondary | ICD-10-CM

## 2015-01-07 DIAGNOSIS — T148XXA Other injury of unspecified body region, initial encounter: Secondary | ICD-10-CM

## 2015-01-07 DIAGNOSIS — S79912A Unspecified injury of left hip, initial encounter: Secondary | ICD-10-CM | POA: Diagnosis not present

## 2015-01-07 DIAGNOSIS — Z66 Do not resuscitate: Secondary | ICD-10-CM | POA: Diagnosis present

## 2015-01-07 DIAGNOSIS — N390 Urinary tract infection, site not specified: Secondary | ICD-10-CM

## 2015-01-07 DIAGNOSIS — Z862 Personal history of diseases of the blood and blood-forming organs and certain disorders involving the immune mechanism: Secondary | ICD-10-CM

## 2015-01-07 DIAGNOSIS — Z86010 Personal history of colonic polyps: Secondary | ICD-10-CM

## 2015-01-07 DIAGNOSIS — F419 Anxiety disorder, unspecified: Secondary | ICD-10-CM | POA: Insufficient documentation

## 2015-01-07 DIAGNOSIS — E039 Hypothyroidism, unspecified: Secondary | ICD-10-CM | POA: Insufficient documentation

## 2015-01-07 DIAGNOSIS — T148 Other injury of unspecified body region: Secondary | ICD-10-CM | POA: Diagnosis not present

## 2015-01-07 DIAGNOSIS — Y998 Other external cause status: Secondary | ICD-10-CM

## 2015-01-07 DIAGNOSIS — Z79899 Other long term (current) drug therapy: Secondary | ICD-10-CM

## 2015-01-07 DIAGNOSIS — R627 Adult failure to thrive: Secondary | ICD-10-CM | POA: Diagnosis not present

## 2015-01-07 DIAGNOSIS — Z8619 Personal history of other infectious and parasitic diseases: Secondary | ICD-10-CM

## 2015-01-07 DIAGNOSIS — F05 Delirium due to known physiological condition: Secondary | ICD-10-CM | POA: Diagnosis not present

## 2015-01-07 DIAGNOSIS — S0083XA Contusion of other part of head, initial encounter: Secondary | ICD-10-CM | POA: Diagnosis not present

## 2015-01-07 DIAGNOSIS — Y9389 Activity, other specified: Secondary | ICD-10-CM

## 2015-01-07 DIAGNOSIS — S80211A Abrasion, right knee, initial encounter: Secondary | ICD-10-CM

## 2015-01-07 DIAGNOSIS — F0280 Dementia in other diseases classified elsewhere without behavioral disturbance: Secondary | ICD-10-CM | POA: Diagnosis not present

## 2015-01-07 DIAGNOSIS — Z515 Encounter for palliative care: Secondary | ICD-10-CM | POA: Diagnosis not present

## 2015-01-07 DIAGNOSIS — G9341 Metabolic encephalopathy: Secondary | ICD-10-CM | POA: Diagnosis not present

## 2015-01-07 DIAGNOSIS — I129 Hypertensive chronic kidney disease with stage 1 through stage 4 chronic kidney disease, or unspecified chronic kidney disease: Secondary | ICD-10-CM

## 2015-01-07 DIAGNOSIS — S299XXA Unspecified injury of thorax, initial encounter: Secondary | ICD-10-CM | POA: Diagnosis not present

## 2015-01-07 DIAGNOSIS — M1990 Unspecified osteoarthritis, unspecified site: Secondary | ICD-10-CM

## 2015-01-07 DIAGNOSIS — Z87440 Personal history of urinary (tract) infections: Secondary | ICD-10-CM

## 2015-01-07 DIAGNOSIS — M79603 Pain in arm, unspecified: Secondary | ICD-10-CM | POA: Diagnosis not present

## 2015-01-07 DIAGNOSIS — Y92129 Unspecified place in nursing home as the place of occurrence of the external cause: Secondary | ICD-10-CM | POA: Insufficient documentation

## 2015-01-07 DIAGNOSIS — R52 Pain, unspecified: Secondary | ICD-10-CM | POA: Diagnosis present

## 2015-01-07 DIAGNOSIS — Z87891 Personal history of nicotine dependence: Secondary | ICD-10-CM | POA: Diagnosis not present

## 2015-01-07 DIAGNOSIS — E860 Dehydration: Secondary | ICD-10-CM | POA: Diagnosis not present

## 2015-01-07 DIAGNOSIS — Z7982 Long term (current) use of aspirin: Secondary | ICD-10-CM

## 2015-01-07 DIAGNOSIS — J9809 Other diseases of bronchus, not elsewhere classified: Secondary | ICD-10-CM | POA: Diagnosis not present

## 2015-01-07 DIAGNOSIS — Z792 Long term (current) use of antibiotics: Secondary | ICD-10-CM | POA: Insufficient documentation

## 2015-01-07 DIAGNOSIS — W19XXXA Unspecified fall, initial encounter: Secondary | ICD-10-CM

## 2015-01-07 DIAGNOSIS — S0990XA Unspecified injury of head, initial encounter: Secondary | ICD-10-CM | POA: Diagnosis not present

## 2015-01-07 DIAGNOSIS — N189 Chronic kidney disease, unspecified: Secondary | ICD-10-CM | POA: Insufficient documentation

## 2015-01-07 DIAGNOSIS — Z90710 Acquired absence of both cervix and uterus: Secondary | ICD-10-CM | POA: Diagnosis not present

## 2015-01-07 DIAGNOSIS — S0003XA Contusion of scalp, initial encounter: Secondary | ICD-10-CM | POA: Diagnosis not present

## 2015-01-07 DIAGNOSIS — I10 Essential (primary) hypertension: Secondary | ICD-10-CM | POA: Diagnosis not present

## 2015-01-07 DIAGNOSIS — R296 Repeated falls: Secondary | ICD-10-CM | POA: Diagnosis present

## 2015-01-07 DIAGNOSIS — Z9849 Cataract extraction status, unspecified eye: Secondary | ICD-10-CM | POA: Diagnosis not present

## 2015-01-07 DIAGNOSIS — R404 Transient alteration of awareness: Secondary | ICD-10-CM | POA: Diagnosis not present

## 2015-01-07 DIAGNOSIS — G9340 Encephalopathy, unspecified: Secondary | ICD-10-CM | POA: Diagnosis not present

## 2015-01-07 DIAGNOSIS — T68XXXA Hypothermia, initial encounter: Secondary | ICD-10-CM

## 2015-01-07 DIAGNOSIS — S79911A Unspecified injury of right hip, initial encounter: Secondary | ICD-10-CM | POA: Diagnosis not present

## 2015-01-07 DIAGNOSIS — N139 Obstructive and reflux uropathy, unspecified: Secondary | ICD-10-CM | POA: Diagnosis not present

## 2015-01-07 DIAGNOSIS — R4182 Altered mental status, unspecified: Secondary | ICD-10-CM | POA: Diagnosis not present

## 2015-01-07 DIAGNOSIS — J690 Pneumonitis due to inhalation of food and vomit: Secondary | ICD-10-CM | POA: Diagnosis not present

## 2015-01-07 DIAGNOSIS — S3992XA Unspecified injury of lower back, initial encounter: Secondary | ICD-10-CM | POA: Diagnosis not present

## 2015-01-07 DIAGNOSIS — W1839XA Other fall on same level, initial encounter: Secondary | ICD-10-CM

## 2015-01-07 DIAGNOSIS — Z8709 Personal history of other diseases of the respiratory system: Secondary | ICD-10-CM

## 2015-01-07 DIAGNOSIS — R05 Cough: Secondary | ICD-10-CM | POA: Diagnosis not present

## 2015-01-07 DIAGNOSIS — R401 Stupor: Secondary | ICD-10-CM | POA: Diagnosis not present

## 2015-01-07 DIAGNOSIS — G309 Alzheimer's disease, unspecified: Secondary | ICD-10-CM | POA: Diagnosis not present

## 2015-01-07 DIAGNOSIS — S199XXA Unspecified injury of neck, initial encounter: Secondary | ICD-10-CM | POA: Diagnosis not present

## 2015-01-07 LAB — CBC WITH DIFFERENTIAL/PLATELET
Basophils Absolute: 0 10*3/uL (ref 0.0–0.1)
Basophils Relative: 0 % (ref 0–1)
EOS PCT: 0 % (ref 0–5)
Eosinophils Absolute: 0 10*3/uL (ref 0.0–0.7)
HCT: 37.3 % (ref 36.0–46.0)
Hemoglobin: 12.3 g/dL (ref 12.0–15.0)
LYMPHS ABS: 1.5 10*3/uL (ref 0.7–4.0)
Lymphocytes Relative: 12 % (ref 12–46)
MCH: 29.3 pg (ref 26.0–34.0)
MCHC: 33 g/dL (ref 30.0–36.0)
MCV: 88.8 fL (ref 78.0–100.0)
MONOS PCT: 10 % (ref 3–12)
Monocytes Absolute: 1.2 10*3/uL — ABNORMAL HIGH (ref 0.1–1.0)
Neutro Abs: 9.7 10*3/uL — ABNORMAL HIGH (ref 1.7–7.7)
Neutrophils Relative %: 78 % — ABNORMAL HIGH (ref 43–77)
PLATELETS: 179 10*3/uL (ref 150–400)
RBC: 4.2 MIL/uL (ref 3.87–5.11)
RDW: 12.6 % (ref 11.5–15.5)
WBC: 12.4 10*3/uL — AB (ref 4.0–10.5)

## 2015-01-07 LAB — COMPREHENSIVE METABOLIC PANEL
ALT: 16 U/L (ref 14–54)
ANION GAP: 9 (ref 5–15)
AST: 19 U/L (ref 15–41)
Albumin: 2.9 g/dL — ABNORMAL LOW (ref 3.5–5.0)
Alkaline Phosphatase: 78 U/L (ref 38–126)
BILIRUBIN TOTAL: 0.5 mg/dL (ref 0.3–1.2)
BUN: 33 mg/dL — ABNORMAL HIGH (ref 6–20)
CO2: 30 mmol/L (ref 22–32)
Calcium: 9 mg/dL (ref 8.9–10.3)
Chloride: 102 mmol/L (ref 101–111)
Creatinine, Ser: 0.85 mg/dL (ref 0.44–1.00)
GFR calc non Af Amer: 58 mL/min — ABNORMAL LOW (ref >60)
Glucose, Bld: 108 mg/dL — ABNORMAL HIGH (ref 65–99)
Potassium: 5 mmol/L (ref 3.5–5.1)
SODIUM: 141 mmol/L (ref 135–145)
Total Protein: 6.2 g/dL — ABNORMAL LOW (ref 6.5–8.1)

## 2015-01-07 LAB — URINE MICROSCOPIC-ADD ON

## 2015-01-07 LAB — TROPONIN I: Troponin I: <0.03 ng/mL (ref <0.031)

## 2015-01-07 LAB — URINALYSIS, ROUTINE W REFLEX MICROSCOPIC
Bilirubin Urine: NEGATIVE
GLUCOSE, UA: NEGATIVE mg/dL
Ketones, ur: 15 mg/dL — AB
NITRITE: NEGATIVE
PROTEIN: NEGATIVE mg/dL
Specific Gravity, Urine: 1.022 (ref 1.005–1.030)
UROBILINOGEN UA: 0.2 mg/dL (ref 0.0–1.0)
pH: 5.5 (ref 5.0–8.0)

## 2015-01-07 LAB — I-STAT CG4 LACTIC ACID, ED: Lactic Acid, Venous: 0.78 mmol/L (ref 0.5–2.0)

## 2015-01-07 MED ORDER — DEXTROSE 5 % IV SOLN
1.0000 g | Freq: Once | INTRAVENOUS | Status: AC
  Administered 2015-01-07: 1 g via INTRAVENOUS
  Filled 2015-01-07: qty 10

## 2015-01-07 MED ORDER — CIPROFLOXACIN HCL 500 MG PO TABS: 500.0000 mg | ORAL_TABLET | Freq: Two times a day (BID) | ORAL | Status: DC

## 2015-01-07 NOTE — ED Notes (Signed)
[facility] contacted to transport patient to [facility]  

## 2015-01-07 NOTE — ED Provider Notes (Signed)
CSN: [id number]     Arrival date & time 01/07/15  1214 History   First MD Initiated Contact with Patient 01/07/15 1216     Chief Complaint  Patient presents with  . Fall  . Head Injury     (Consider location/radiation/quality/duration/timing/severity/associated sxs/prior Treatment) HPI Comments: Patient brought to the emergency department by EMS from skilled nursing facility. Patient was found lying on the ground near her bed, unwitnessed fall. She has swelling over her right eye and an abrasion on the right knee. Patient is reportedly extremely sedated at baseline because of severe agitation, presents sedated and minimally responsive. She is a DO NOT RESUSCITATE. Level V Caveat due to noncommunicative state, minimal responsiveness.  Patient is a 81 y.o. female presenting with fall and head injury.  Fall  Head Injury   Past Medical History  Diagnosis Date  . Arthritis   . Chicken pox   . Chronic kidney disease   . Colon polyps   . Hypertension   . Dementia   . Angina   . Dysrhythmia   . Heart murmur   . Shortness of breath   . Recurrent upper respiratory infection (URI)   . Pneumonia   . Hypothyroidism   . Anemia   . Headache(784.0)   . Anxiety   . Depression   . Valvular heart disease 09/04/2012  . Left leg pain 10/02/2012  . UTI (urinary tract infection) 12/30/2012  . Acute bronchitis 04/10/2013  . Dehydration 08/31/2013  . Arthritis 10/02/2012   Past Surgical History  Procedure Laterality Date  . Appendectomy    . Cholecystectomy    . Kidney stones    . Tonsillectomy    . Abdominal hysterectomy    . Hand surgery    . Feet surgery    . Shoulder arthroscopy    . Back surgery    . Tonsillectomy    . Eye surgery      cataract removed and eye lids lifted  . Fracture surgery      bilateral arms  . Tubal ligation    . Colonoscopy w/ polypectomy     Family History  Problem Relation Age of Onset  . Heart disease Mother   . Heart disease Father   . Heart disease  Other   . Birth defects Other    History  Substance Use Topics  . Smoking status: Former Smoker -- 0.20 packs/day for 1 years    Types: Cigarettes    Quit date: 06/02/1941  . Smokeless tobacco: Never Used  . Alcohol Use: No   OB History    No data available     Review of Systems  Unable to perform ROS: Patient nonverbal      Allergies  Morphine and related; Codeine; and Hydrocodone  Home Medications   Prior to Admission medications   Medication Sig Start Date End Date Taking? Authorizing Provider  albuterol (PROVENTIL HFA;VENTOLIN HFA) 108 (90 BASE) MCG/ACT inhaler Inhale 2 puffs into the lungs every 6 (six) hours as needed for wheezing or shortness of breath. 09/23/13   Sandford Craze, NP  aspirin 81 MG tablet Take 81 mg by mouth daily.    Historical Provider, MD  cephALEXin (KEFLEX) 500 MG capsule Take 1 capsule (500 mg total) by mouth 2 (two) times daily. 12/05/14   Kristen N Ward, DO  ciprofloxacin (CIPRO) 500 MG tablet Take 1 tablet (500 mg total) by mouth 2 (two) times daily. One po bid x 7 days 01/07/15   Gilda Crease,  MD  lactulose (CHRONULAC) 10 GM/15ML solution Take 30 mLs (20 g total) by mouth 2 (two) times daily. 11/26/14   Rhetta Mura, MD  levothyroxine (SYNTHROID, LEVOTHROID) 50 MCG tablet TAKE 1 TABLET (50 MCG TOTAL) BY MOUTH DAILY. Patient not taking: Reported on 12/05/2014 09/25/14   Judie Bonus, MD  levothyroxine (SYNTHROID, LEVOTHROID) 50 MCG tablet Take 50 mcg by mouth daily before breakfast.    Historical Provider, MD  LORazepam (ATIVAN) 1 MG tablet Take 0.5 tablets (0.5 mg total) by mouth every 8 (eight) hours as needed for anxiety or sleep. 11/29/14   Zannie Cove, MD  losartan (COZAAR) 50 MG tablet Take 1 tablet (50 mg total) by mouth daily. 10/28/14   Catarina Hartshorn, MD  Magnesium 250 MG TABS Take 250 mg by mouth daily.     Historical Provider, MD   BP 142/63 mmHg  Pulse 92  Temp(Src) 94.4 F (34.7 C) (Rectal)  Resp 16  SpO2  96% Physical Exam  Constitutional: She is oriented to person, place, and time. She appears well-developed and well-nourished. No distress.  HENT:  Head: Normocephalic. Head is with contusion (Above right eye).    Right Ear: Hearing normal.  Left Ear: Hearing normal.  Nose: Nose normal.  Mouth/Throat: Oropharynx is clear and moist and mucous membranes are normal.  Pulse approximately 1 mm, pinpoint, minimally reactive  Eyes: Conjunctivae and EOM are normal. Pupils are equal, round, and reactive to light.  Neck: Normal range of motion. Neck supple.  Cardiovascular: Regular rhythm, S1 normal and S2 normal.  Exam reveals no gallop and no friction rub.   No murmur heard. Pulmonary/Chest: Effort normal and breath sounds normal. No respiratory distress. She exhibits no tenderness.  Abdominal: Soft. Normal appearance and bowel sounds are normal. There is no hepatosplenomegaly. There is no tenderness. There is no rebound, no guarding, no tenderness at McBurney's point and negative Murphy's sign. No hernia.  Musculoskeletal: Normal range of motion.       Right knee: She exhibits no swelling, no effusion and no deformity.  Neurological: She is alert and oriented to person, place, and time. She has normal strength. No cranial nerve deficit or sensory deficit. Coordination normal. GCS eye subscore is 4. GCS verbal subscore is 5. GCS motor subscore is 6.  Skin: Skin is warm and dry. Abrasion (Superficial, right knee) noted. No rash noted. No cyanosis.     Psychiatric: She has a normal mood and affect. Her speech is normal and behavior is normal. Thought content normal.  Nursing note and vitals reviewed.   ED Course  Procedures (including critical care time) Labs Review Labs Reviewed  CBC WITH DIFFERENTIAL/PLATELET - Abnormal; Notable for the following:    WBC 12.4 (*)    Neutrophils Relative % 78 (*)    Neutro Abs 9.7 (*)    Monocytes Absolute 1.2 (*)    All other components within normal  limits  COMPREHENSIVE METABOLIC PANEL - Abnormal; Notable for the following:    Glucose, Bld 108 (*)    BUN 33 (*)    Total Protein 6.2 (*)    Albumin 2.9 (*)    GFR calc non Af Amer 58 (*)    All other components within normal limits  URINALYSIS, ROUTINE W REFLEX MICROSCOPIC (NOT AT [facility]) - Abnormal; Notable for the following:    APPearance CLOUDY (*)    Hgb urine dipstick TRACE (*)    Ketones, ur 15 (*)    Leukocytes, UA SMALL (*)  All other components within normal limits  URINE MICROSCOPIC-ADD ON - Abnormal; Notable for the following:    Bacteria, UA MANY (*)    All other components within normal limits  CULTURE, BLOOD (ROUTINE X 2)  CULTURE, BLOOD (ROUTINE X 2)  URINE CULTURE  TROPONIN I  I-STAT CG4 LACTIC ACID, ED    Imaging Review Dg Chest 1 View  01/07/2015   CLINICAL DATA:  Found down. Periorbital hematoma on the right with knee abrasions. History of dementia.  EXAM: 12/05/2014.  COMPARISON:  None.  FINDINGS: 1236 hr. Two views obtained. The heart size and mediastinal contours are normal. The lungs are clear. There is no pleural effusion or pneumothorax. No acute osseous findings are identified. Thoracic spine degenerative changes noted.  IMPRESSION: No active cardiopulmonary process.   Electronically Signed   By: Carey Bullocks M.D.   On: 01/07/2015 13:16   Dg Thoracic Spine 2 View  01/07/2015   CLINICAL DATA:  Found down. Right periorbital hematoma. History of dementia and back surgery.  EXAM: THORACIC SPINE 2 VIEWS  COMPARISON:  Chest radiographs 01/07/2015 and 12/05/2014.  FINDINGS: There are 12 rib-bearing thoracic type vertebral bodies. The bones are mildly demineralized. There are mild degenerative changes throughout the thoracic spine with near anatomic alignment. No evidence of acute fracture or paraspinal hematoma.  IMPRESSION: No evidence of acute thoracic spine injury.   Electronically Signed   By: Carey Bullocks M.D.   On: 01/07/2015 13:18   Dg Lumbar Spine  Complete  01/07/2015   CLINICAL DATA:  Per ED note: To room via EMS. Pt from [facility]. Pt is sedated and is usually kept this way, if not, pt kicks and screams. Staff found pt on floor. Hematoma above right eye, abrasion on right knee. H/o HTN, dementia, back surgery  EXAM: LUMBAR SPINE - COMPLETE 4+ VIEW  COMPARISON:  11/27/2014  FINDINGS: No fracture. No spondylolisthesis. Mild loss disc height from L2-L3 through L4-L5. Mild facet degenerative change in the lower lumbar spine. Bones are demineralized. Soft tissues show aortic vascular calcifications right quadrant surgical suture material.  IMPRESSION: 1. No fracture or acute finding. Degenerative changes, stable from the prior study.   Electronically Signed   By: Amie Portland M.D.   On: 01/07/2015 13:17   Ct Head Wo Contrast  01/07/2015   CLINICAL DATA:  Patient fell, found on floor, large hematoma right had  EXAM: CT HEAD WITHOUT CONTRAST  CT CERVICAL SPINE WITHOUT CONTRAST  TECHNIQUE: Multidetector CT imaging of the head and cervical spine was performed following the standard protocol without intravenous contrast. Multiplanar CT image reconstructions of the cervical spine were also generated.  COMPARISON:  12/05/2014  FINDINGS: CT HEAD FINDINGS  Large right frontal temporal scalp hematoma. No skull fracture. No intracranial hemorrhage or extra-axial fluid. Atrophy with microvascular changes in the deep white matter. No infarct mass or hydrocephalus.  CT CERVICAL SPINE FINDINGS  Normal alignment. No fracture or soft tissue abnormality. Multilevel degenerative disc disease. Lung apices clear.  IMPRESSION: Large right scalp hematoma. No acute intracranial abnormalities. No evidence of cervical spine fracture.   Electronically Signed   By: Esperanza Heir M.D.   On: 01/07/2015 13:32   Ct Cervical Spine Wo Contrast  01/07/2015   CLINICAL DATA:  Patient fell, found on floor, large hematoma right had  EXAM: CT HEAD WITHOUT CONTRAST  CT CERVICAL SPINE  WITHOUT CONTRAST  TECHNIQUE: Multidetector CT imaging of the head and cervical spine was performed following the standard protocol without  intravenous contrast. Multiplanar CT image reconstructions of the cervical spine were also generated.  COMPARISON:  12/05/2014  FINDINGS: CT HEAD FINDINGS  Large right frontal temporal scalp hematoma. No skull fracture. No intracranial hemorrhage or extra-axial fluid. Atrophy with microvascular changes in the deep white matter. No infarct mass or hydrocephalus.  CT CERVICAL SPINE FINDINGS  Normal alignment. No fracture or soft tissue abnormality. Multilevel degenerative disc disease. Lung apices clear.  IMPRESSION: Large right scalp hematoma. No acute intracranial abnormalities. No evidence of cervical spine fracture.   Electronically Signed   By: Esperanza Heir M.D.   On: 01/07/2015 13:32   Dg Hips Bilat With Pelvis 3-4 Views  01/07/2015   CLINICAL DATA:  Per ED note: To room via EMS. Pt from [facility]. Pt is sedated and is usually kept this way, if not, pt kicks and screams. Staff found pt on floor. Hematoma above right eye, abrasion on right knee.  H/o HTN, dementia, back surgery  EXAM: DG HIP (WITH OR WITHOUT PELVIS) 3-4V BILAT  COMPARISON:  None.  FINDINGS: No fracture. No bone lesion. Bones demineralized. There is mild superior lateral joint space narrowing. No other arthropathic change. Soft tissues are unremarkable.  IMPRESSION: No fracture or acute finding.   Electronically Signed   By: Amie Portland M.D.   On: 01/07/2015 13:16     EKG Interpretation   Date/Time:  Sunday January 07 2015 13:40:44 EDT Ventricular Rate:  67 PR Interval:  237 QRS Duration: 147 QT Interval:  450 QTC Calculation: 475 R Axis:   -63 Text Interpretation:  Atrial fibrillation Right bundle branch block  Inferior infarct, old No significant change since last tracing Confirmed  by Zora Glendenning  MD, Zharia Conrow [phone]) on 01/07/2015 2:33:43 PM      MDM   Final diagnoses:  Fall   Facial contusion, initial encounter  Abrasion  UTI (lower urinary tract infection)    Patient presented to the ER for evaluation of a witnessed fall. Patient is severely sedated at arrival. This has been her states that she was admitted to [facility] for severe agitation. She is on Ativan around-the-clock. Family reports there has been little change in her mental status since discharge. It's unclear how she fell today. She did have a bruise above her right eye. All imaging today has been negative. She does have evidence of possible urinary tract infection. Urinalysis in the past, however, has been similar with normal culture. She was treated empirically. I did discuss this with her son. He does not wish to have her admitted to the hospital at this time. He has her power of attorney. He understands that she is hypothermic, possibly infected. At this point her lactate is normal, vital signs are normal, I do not suspect sepsis. The patient will be returned to the nursing home for continued management.    Gilda Crease, MD 01/07/15 [phone]

## 2015-01-07 NOTE — ED Notes (Signed)
MD made aware of pts HR during I/O cath decreasing to 26 and then back to 79. Bair hugger applied for rectal temp.

## 2015-01-07 NOTE — Discharge Instructions (Signed)
Abrasion An abrasion is a cut or scrape of the skin. Abrasions do not extend through all layers of the skin and most heal within 10 days. It is important to care for your abrasion properly to prevent infection. CAUSES  Most abrasions are caused by falling on, or gliding across, the ground or other surface. When your skin rubs on something, the outer and inner layer of skin rubs off, causing an abrasion. DIAGNOSIS  Your caregiver will be able to diagnose an abrasion during a physical exam.  TREATMENT  Your treatment depends on how large and deep the abrasion is. Generally, your abrasion will be cleaned with water and a mild soap to remove any dirt or debris. An antibiotic ointment may be put over the abrasion to prevent an infection. A bandage (dressing) may be wrapped around the abrasion to keep it from getting dirty.  You may need a tetanus shot if:  You cannot remember when you had your last tetanus shot.  You have never had a tetanus shot.  The injury broke your skin. If you get a tetanus shot, your arm may swell, get red, and feel warm to the touch. This is common and not a problem. If you need a tetanus shot and you choose not to have one, there is a rare chance of getting tetanus. Sickness from tetanus can be serious.  HOME CARE INSTRUCTIONS   If a dressing was applied, change it at least once a day or as directed by your caregiver. If the bandage sticks, soak it off with warm water.   Wash the area with water and a mild soap to remove all the ointment 2 times a day. Rinse off the soap and pat the area dry with a clean towel.   Reapply any ointment as directed by your caregiver. This will help prevent infection and keep the bandage from sticking. Use gauze over the wound and under the dressing to help keep the bandage from sticking.   Change your dressing right away if it becomes wet or dirty.   Only take over-the-counter or prescription medicines for pain, discomfort, or fever as  directed by your caregiver.   Follow up with your caregiver within 24-48 hours for a wound check, or as directed. If you were not given a wound-check appointment, look closely at your abrasion for redness, swelling, or pus. These are signs of infection. SEEK IMMEDIATE MEDICAL CARE IF:   You have increasing pain in the wound.   You have redness, swelling, or tenderness around the wound.   You have pus coming from the wound.   You have a fever or persistent symptoms for more than 2-3 days.  You have a fever and your symptoms suddenly get worse.  You have a bad smell coming from the wound or dressing.  MAKE SURE YOU:   Understand these instructions.  Will watch your condition.  Will get help right away if you are not doing well or get worse. Document Released: 02/26/2005 Document Revised: 05/05/2012 Document Reviewed: 04/22/2011 [facility] Patient Information 2015 [place], [place]. This information is not intended to replace advice given to you by your health care provider. Make sure you discuss any questions you have with your health care provider.  Contusion A contusion is a deep bruise. Contusions are the result of an injury that caused bleeding under the skin. The contusion may turn blue, purple, or yellow. Minor injuries will give you a painless contusion, but more severe contusions may stay painful and swollen  for a few weeks.  CAUSES  A contusion is usually caused by a blow, trauma, or direct force to an area of the body. SYMPTOMS   Swelling and redness of the injured area.  Bruising of the injured area.  Tenderness and soreness of the injured area.  Pain. DIAGNOSIS  The diagnosis can be made by taking a history and physical exam. An X-ray, CT scan, or MRI may be needed to determine if there were any associated injuries, such as fractures. TREATMENT  Specific treatment will depend on what area of the body was injured. In general, the best treatment for a contusion is  resting, icing, elevating, and applying cold compresses to the injured area. Over-the-counter medicines may also be recommended for pain control. Ask your caregiver what the best treatment is for your contusion. HOME CARE INSTRUCTIONS   Put ice on the injured area.  Put ice in a plastic bag.  Place a towel between your skin and the bag.  Leave the ice on for 15-20 minutes, 3-4 times a day, or as directed by your health care provider.  Only take over-the-counter or prescription medicines for pain, discomfort, or fever as directed by your caregiver. Your caregiver may recommend avoiding anti-inflammatory medicines (aspirin, ibuprofen, and naproxen) for 48 hours because these medicines may increase bruising.  Rest the injured area.  If possible, elevate the injured area to reduce swelling. SEEK IMMEDIATE MEDICAL CARE IF:   You have increased bruising or swelling.  You have pain that is getting worse.  Your swelling or pain is not relieved with medicines. MAKE SURE YOU:   Understand these instructions.  Will watch your condition.  Will get help right away if you are not doing well or get worse. Document Released: 02/26/2005 Document Revised: 05/24/2013 Document Reviewed: 03/24/2011 [facility] Patient Information 2015 [place], [place]. This information is not intended to replace advice given to you by your health care provider. Make sure you discuss any questions you have with your health care provider.  Urinary Tract Infection Urinary tract infections (UTIs) can develop anywhere along your urinary tract. Your urinary tract is your body's drainage system for removing wastes and extra water. Your urinary tract includes two kidneys, two ureters, a bladder, and a urethra. Your kidneys are a pair of bean-shaped organs. Each kidney is about the size of your fist. They are located below your ribs, one on each side of your spine. CAUSES Infections are caused by microbes, which are microscopic  organisms, including fungi, viruses, and bacteria. These organisms are so small that they can only be seen through a microscope. Bacteria are the microbes that most commonly cause UTIs. SYMPTOMS  Symptoms of UTIs may vary by age and gender of the patient and by the location of the infection. Symptoms in young women typically include a frequent and intense urge to urinate and a painful, burning feeling in the bladder or urethra during urination. Older women and men are more likely to be tired, shaky, and weak and have muscle aches and abdominal pain. A fever may mean the infection is in your kidneys. Other symptoms of a kidney infection include pain in your back or sides below the ribs, nausea, and vomiting. DIAGNOSIS To diagnose a UTI, your caregiver will ask you about your symptoms. Your caregiver also will ask to provide a urine sample. The urine sample will be tested for bacteria and white blood cells. White blood cells are made by your body to help fight infection. TREATMENT  Typically, UTIs  can be treated with medication. Because most UTIs are caused by a bacterial infection, they usually can be treated with the use of antibiotics. The choice of antibiotic and length of treatment depend on your symptoms and the type of bacteria causing your infection. HOME CARE INSTRUCTIONS  If you were prescribed antibiotics, take them exactly as your caregiver instructs you. Finish the medication even if you feel better after you have only taken some of the medication.  Drink enough water and fluids to keep your urine clear or pale yellow.  Avoid caffeine, tea, and carbonated beverages. They tend to irritate your bladder.  Empty your bladder often. Avoid holding urine for long periods of time.  Empty your bladder before and after sexual intercourse.  After a bowel movement, women should cleanse from front to back. Use each tissue only once. SEEK MEDICAL CARE IF:   You have back pain.  You develop a  fever.  Your symptoms do not begin to resolve within 3 days. SEEK IMMEDIATE MEDICAL CARE IF:   You have severe back pain or lower abdominal pain.  You develop chills.  You have nausea or vomiting.  You have continued burning or discomfort with urination. MAKE SURE YOU:   Understand these instructions.  Will watch your condition.  Will get help right away if you are not doing well or get worse. Document Released: 02/26/2005 Document Revised: 11/18/2011 Document Reviewed: 06/27/2011 [facility] Patient Information 2015 [place], [place]. This information is not intended to replace advice given to you by your health care provider. Make sure you discuss any questions you have with your health care provider.

## 2015-01-07 NOTE — ED Notes (Signed)
To room via EMS.  Pt from [facility].  Pt is sedated and is usually kept this way, if not, pt kicks and screams.  Staff found pt on floor.  Hematoma above right eye, abrasion on right knee.

## 2015-01-07 NOTE — ED Notes (Signed)
Patient transported to X-ray 

## 2015-01-08 ENCOUNTER — Emergency Department (HOSPITAL_COMMUNITY): Payer: Commercial Managed Care - HMO

## 2015-01-08 ENCOUNTER — Encounter (HOSPITAL_COMMUNITY): Payer: Self-pay | Admitting: Emergency Medicine

## 2015-01-08 ENCOUNTER — Inpatient Hospital Stay (HOSPITAL_COMMUNITY)
Admission: EM | Admit: 2015-01-08 | Discharge: 2015-01-16 | DRG: 177 | Disposition: A | Discharge status: Skilled Nursing Facility | Payer: Commercial Managed Care - HMO | Attending: Family Medicine | Admitting: Family Medicine

## 2015-01-08 DIAGNOSIS — T68XXXA Hypothermia, initial encounter: Secondary | ICD-10-CM | POA: Diagnosis present

## 2015-01-08 DIAGNOSIS — Z9849 Cataract extraction status, unspecified eye: Secondary | ICD-10-CM | POA: Diagnosis not present

## 2015-01-08 DIAGNOSIS — F0390 Unspecified dementia without behavioral disturbance: Secondary | ICD-10-CM

## 2015-01-08 DIAGNOSIS — F0280 Dementia in other diseases classified elsewhere without behavioral disturbance: Secondary | ICD-10-CM | POA: Diagnosis present

## 2015-01-08 DIAGNOSIS — E039 Hypothyroidism, unspecified: Secondary | ICD-10-CM | POA: Diagnosis present

## 2015-01-08 DIAGNOSIS — F0282 Dementia in other diseases classified elsewhere, unspecified severity, with psychotic disturbance: Secondary | ICD-10-CM | POA: Diagnosis present

## 2015-01-08 DIAGNOSIS — Z885 Allergy status to narcotic agent status: Secondary | ICD-10-CM

## 2015-01-08 DIAGNOSIS — I38 Endocarditis, valve unspecified: Secondary | ICD-10-CM

## 2015-01-08 DIAGNOSIS — Z90710 Acquired absence of both cervix and uterus: Secondary | ICD-10-CM | POA: Diagnosis not present

## 2015-01-08 DIAGNOSIS — Z515 Encounter for palliative care: Secondary | ICD-10-CM | POA: Diagnosis not present

## 2015-01-08 DIAGNOSIS — Z66 Do not resuscitate: Secondary | ICD-10-CM | POA: Diagnosis present

## 2015-01-08 DIAGNOSIS — R296 Repeated falls: Secondary | ICD-10-CM | POA: Diagnosis present

## 2015-01-08 DIAGNOSIS — R627 Adult failure to thrive: Secondary | ICD-10-CM | POA: Diagnosis present

## 2015-01-08 DIAGNOSIS — S0003XA Contusion of scalp, initial encounter: Secondary | ICD-10-CM | POA: Diagnosis present

## 2015-01-08 DIAGNOSIS — G309 Alzheimer's disease, unspecified: Secondary | ICD-10-CM | POA: Diagnosis present

## 2015-01-08 DIAGNOSIS — Z79899 Other long term (current) drug therapy: Secondary | ICD-10-CM | POA: Diagnosis not present

## 2015-01-08 DIAGNOSIS — Z87891 Personal history of nicotine dependence: Secondary | ICD-10-CM | POA: Diagnosis not present

## 2015-01-08 DIAGNOSIS — G9341 Metabolic encephalopathy: Secondary | ICD-10-CM | POA: Diagnosis present

## 2015-01-08 DIAGNOSIS — Q386 Other congenital malformations of mouth: Secondary | ICD-10-CM

## 2015-01-08 DIAGNOSIS — G9340 Encephalopathy, unspecified: Secondary | ICD-10-CM | POA: Diagnosis not present

## 2015-01-08 DIAGNOSIS — Z7982 Long term (current) use of aspirin: Secondary | ICD-10-CM | POA: Diagnosis not present

## 2015-01-08 DIAGNOSIS — R52 Pain, unspecified: Secondary | ICD-10-CM | POA: Diagnosis present

## 2015-01-08 DIAGNOSIS — F05 Delirium due to known physiological condition: Secondary | ICD-10-CM | POA: Diagnosis present

## 2015-01-08 DIAGNOSIS — J690 Pneumonitis due to inhalation of food and vomit: Principal | ICD-10-CM | POA: Diagnosis present

## 2015-01-08 DIAGNOSIS — N390 Urinary tract infection, site not specified: Secondary | ICD-10-CM | POA: Diagnosis present

## 2015-01-08 DIAGNOSIS — J189 Pneumonia, unspecified organism: Secondary | ICD-10-CM

## 2015-01-08 DIAGNOSIS — E860 Dehydration: Secondary | ICD-10-CM | POA: Diagnosis present

## 2015-01-08 DIAGNOSIS — R4182 Altered mental status, unspecified: Secondary | ICD-10-CM

## 2015-01-08 LAB — COMPREHENSIVE METABOLIC PANEL
ALT: 16 U/L (ref 14–54)
AST: 20 U/L (ref 15–41)
Albumin: 2.7 g/dL — ABNORMAL LOW (ref 3.5–5.0)
Alkaline Phosphatase: 81 U/L (ref 38–126)
Anion gap: 10 (ref 5–15)
BUN: 26 mg/dL — AB (ref 6–20)
CALCIUM: 9 mg/dL (ref 8.9–10.3)
CO2: 28 mmol/L (ref 22–32)
Chloride: 102 mmol/L (ref 101–111)
Creatinine, Ser: 0.98 mg/dL (ref 0.44–1.00)
GFR calc Af Amer: 56 mL/min — ABNORMAL LOW (ref >60)
GFR calc non Af Amer: 49 mL/min — ABNORMAL LOW (ref >60)
Glucose, Bld: 140 mg/dL — ABNORMAL HIGH (ref 65–99)
Potassium: 4.9 mmol/L (ref 3.5–5.1)
Sodium: 140 mmol/L (ref 135–145)
TOTAL PROTEIN: 6.3 g/dL — AB (ref 6.5–8.1)
Total Bilirubin: 0.4 mg/dL (ref 0.3–1.2)

## 2015-01-08 LAB — URINE CULTURE: Culture: NO GROWTH

## 2015-01-08 LAB — PROTIME-INR
INR: 1.08 (ref 0.00–1.49)
PROTHROMBIN TIME: 14.2 s (ref 11.6–15.2)

## 2015-01-08 LAB — TYPE AND SCREEN
ABO/RH(D): O NEG
Antibody Screen: POSITIVE
DAT, IGG: NEGATIVE

## 2015-01-08 LAB — GLUCOSE, CAPILLARY: Glucose-Capillary: 109 mg/dL — ABNORMAL HIGH (ref 65–99)

## 2015-01-08 LAB — CBC WITH DIFFERENTIAL/PLATELET
BASOS ABS: 0 10*3/uL (ref 0.0–0.1)
BASOS PCT: 0 % (ref 0–1)
EOS ABS: 0 10*3/uL (ref 0.0–0.7)
Eosinophils Relative: 0 % (ref 0–5)
HCT: 38.1 % (ref 36.0–46.0)
HEMOGLOBIN: 12.8 g/dL (ref 12.0–15.0)
LYMPHS PCT: 6 % — AB (ref 12–46)
Lymphs Abs: 0.9 10*3/uL (ref 0.7–4.0)
MCH: 29.9 pg (ref 26.0–34.0)
MCHC: 33.6 g/dL (ref 30.0–36.0)
MCV: 89 fL (ref 78.0–100.0)
Monocytes Absolute: 2.2 10*3/uL — ABNORMAL HIGH (ref 0.1–1.0)
Monocytes Relative: 15 % — ABNORMAL HIGH (ref 3–12)
Neutro Abs: 11.7 10*3/uL — ABNORMAL HIGH (ref 1.7–7.7)
Neutrophils Relative %: 79 % — ABNORMAL HIGH (ref 43–77)
Platelets: 177 10*3/uL (ref 150–400)
RBC: 4.28 MIL/uL (ref 3.87–5.11)
RDW: 12.9 % (ref 11.5–15.5)
WBC: 14.8 10*3/uL — AB (ref 4.0–10.5)

## 2015-01-08 LAB — AMMONIA: AMMONIA: 23 umol/L (ref 9–35)

## 2015-01-08 LAB — MRSA PCR SCREENING: MRSA BY PCR: NEGATIVE

## 2015-01-08 LAB — APTT: APTT: 32 s (ref 24–37)

## 2015-01-08 MED ORDER — LEVOTHYROXINE SODIUM 100 MCG IV SOLR
25.0000 ug | Freq: Every day | INTRAVENOUS | Status: DC
  Administered 2015-01-08 – 2015-01-10 (×3): 25 ug via INTRAVENOUS
  Filled 2015-01-08 (×5): qty 5

## 2015-01-08 MED ORDER — ONDANSETRON HCL 4 MG PO TABS: 4.0000 mg | ORAL_TABLET | Freq: Four times a day (QID) | ORAL | Status: DC | PRN

## 2015-01-08 MED ORDER — DEXTROSE 5 % IV SOLN
1.0000 g | Freq: Once | INTRAVENOUS | Status: AC
  Administered 2015-01-08: 1 g via INTRAVENOUS
  Filled 2015-01-08: qty 10

## 2015-01-08 MED ORDER — DEXTROSE 5 % IV SOLN
1.0000 g | INTRAVENOUS | Status: DC
  Administered 2015-01-09: 1 g via INTRAVENOUS
  Filled 2015-01-08 (×3): qty 10

## 2015-01-08 MED ORDER — ENOXAPARIN SODIUM 40 MG/0.4ML ~~LOC~~ SOLN
40.0000 mg | Freq: Every day | SUBCUTANEOUS | Status: DC
  Administered 2015-01-08 – 2015-01-10 (×3): 40 mg via SUBCUTANEOUS
  Filled 2015-01-08 (×3): qty 0.4

## 2015-01-08 MED ORDER — ACETAMINOPHEN 650 MG RE SUPP: 650.0000 mg | Freq: Four times a day (QID) | RECTAL | Status: DC | PRN

## 2015-01-08 MED ORDER — DEXTROSE-NACL 5-0.45 % IV SOLN
INTRAVENOUS | Status: AC
  Administered 2015-01-08: 12:00:00 via INTRAVENOUS

## 2015-01-08 MED ORDER — ONDANSETRON HCL 4 MG/2ML IJ SOLN: 4.0000 mg | Freq: Four times a day (QID) | INTRAMUSCULAR | Status: DC | PRN

## 2015-01-08 MED ORDER — ACETAMINOPHEN 325 MG PO TABS: 650.0000 mg | ORAL_TABLET | Freq: Four times a day (QID) | ORAL | Status: DC | PRN

## 2015-01-08 MED ORDER — SODIUM CHLORIDE 0.9 % IV SOLN: INTRAVENOUS | Status: DC

## 2015-01-08 MED ORDER — SODIUM CHLORIDE 0.9 % IV SOLN
1000.0000 mL | INTRAVENOUS | Status: DC
  Administered 2015-01-08: 1000 mL via INTRAVENOUS

## 2015-01-08 MED ORDER — CETYLPYRIDINIUM CHLORIDE 0.05 % MT LIQD
7.0000 mL | Freq: Two times a day (BID) | OROMUCOSAL | Status: DC
  Administered 2015-01-08 – 2015-01-10 (×3): 7 mL via OROMUCOSAL

## 2015-01-08 NOTE — ED Provider Notes (Signed)
CSN: [id number]     Arrival date & time 01/08/15  0722 History   First MD Initiated Contact with Patient 01/08/15 [id number]     Chief Complaint  Patient presents with  . Altered Mental Status    Level V caveat: Unresponsiveness HPI Patient presents to the emergency room for evaluation of persistent altered mental status. Patient was actually seen in the emergency room yesterday at about 12 noon. The patient was found yesterday laying on the ground near her bed. They noted she had some swelling above her right eye and abrasion around the right knee. At baseline the patient is sedated because of issues with severe agitation and being medicated for that. During her evaluation in the emergency room yesterday the patient was minimally responsive and noncommunicative. She had an evaluation that included a head CT as well as a cervical spine CT. There was no evidence of any intracranial bleeding or injury. The patient was noted to be hypothermic. She had a possible urinary tract infection. Findings were discussed with the patient's son. He did not want the patient hospitalized. Patient was transferred back to her nursing facility.  According to the nursing home report provided by EMS, the patient slept all night. She remained sedated this morning. This is more than usual for her. Patient was sent back to the emergency room for evaluation. Past Medical History  Diagnosis Date  . Arthritis   . Chicken pox   . Chronic kidney disease   . Colon polyps   . Hypertension   . Dementia   . Angina   . Dysrhythmia   . Heart murmur   . Shortness of breath   . Recurrent upper respiratory infection (URI)   . Pneumonia   . Hypothyroidism   . Anemia   . Headache(784.0)   . Anxiety   . Depression   . Valvular heart disease 09/04/2012  . Left leg pain 10/02/2012  . UTI (urinary tract infection) 12/30/2012  . Acute bronchitis 04/10/2013  . Dehydration 08/31/2013  . Arthritis 10/02/2012   Past Surgical History  Procedure  Laterality Date  . Appendectomy    . Cholecystectomy    . Kidney stones    . Tonsillectomy    . Abdominal hysterectomy    . Hand surgery    . Feet surgery    . Shoulder arthroscopy    . Back surgery    . Tonsillectomy    . Eye surgery      cataract removed and eye lids lifted  . Fracture surgery      bilateral arms  . Tubal ligation    . Colonoscopy w/ polypectomy     Family History  Problem Relation Age of Onset  . Heart disease Mother   . Heart disease Father   . Heart disease Other   . Birth defects Other    History  Substance Use Topics  . Smoking status: Former Smoker -- 0.20 packs/day for 1 years    Types: Cigarettes    Quit date: 06/02/1941  . Smokeless tobacco: Never Used  . Alcohol Use: No   OB History    No data available     Review of Systems  Unable to perform ROS: Mental status change      Allergies  Morphine and related; Codeine; and Hydrocodone  Home Medications   Prior to Admission medications   Medication Sig Start Date End Date Taking? Authorizing Provider  albuterol (PROVENTIL HFA;VENTOLIN HFA) 108 (90 BASE) MCG/ACT inhaler Inhale 2 puffs  into the lungs every 6 (six) hours as needed for wheezing or shortness of breath. 09/23/13  Yes Sandford Craze, NP  aspirin 81 MG tablet Take 81 mg by mouth daily.   Yes Historical Provider, MD  diazepam (VALIUM) 5 MG tablet Take 5 mg by mouth daily after supper.   Yes Historical Provider, MD  divalproex (DEPAKOTE ER) 500 MG 24 hr tablet Take 500 mg by mouth 3 (three) times daily.   Yes Historical Provider, MD  escitalopram (LEXAPRO) 10 MG tablet Take 10 mg by mouth daily.   Yes Historical Provider, MD  lactulose (CHRONULAC) 10 GM/15ML solution Take 30 mLs (20 g total) by mouth 2 (two) times daily. 11/26/14  Yes Rhetta Mura, MD  levothyroxine (SYNTHROID, LEVOTHROID) 50 MCG tablet TAKE 1 TABLET (50 MCG TOTAL) BY MOUTH DAILY. 09/25/14  Yes Judie Bonus, MD  losartan (COZAAR) 50 MG tablet Take 1  tablet (50 mg total) by mouth daily. 10/28/14  Yes Catarina Hartshorn, MD  Magnesium 250 MG TABS Take 250 mg by mouth daily.    Yes Historical Provider, MD  risperiDONE (RISPERDAL) 1 MG tablet Take 1 mg by mouth at bedtime.   Yes Historical Provider, MD  traZODone (DESYREL) 50 MG tablet Take 50 mg by mouth at bedtime as needed for sleep.   Yes Historical Provider, MD  cephALEXin (KEFLEX) 500 MG capsule Take 1 capsule (500 mg total) by mouth 2 (two) times daily. Patient not taking: Reported on 01/08/2015 12/05/14   Layla Maw Ward, DO  ciprofloxacin (CIPRO) 500 MG tablet Take 1 tablet (500 mg total) by mouth 2 (two) times daily. One po bid x 7 days Patient not taking: Reported on 01/08/2015 01/07/15   Gilda Crease, MD  LORazepam (ATIVAN) 1 MG tablet Take 0.5 tablets (0.5 mg total) by mouth every 8 (eight) hours as needed for anxiety or sleep. Patient not taking: Reported on 01/08/2015 11/29/14   Zannie Cove, MD   BP 150/89 mmHg  Pulse 70  Resp 13  SpO2 97% Physical Exam  Constitutional: She appears lethargic. No distress.  HENT:  Head: Normocephalic and atraumatic.  Right Ear: External ear normal.  Left Ear: External ear normal.  Eyes: Conjunctivae are normal. Right eye exhibits no discharge. Left eye exhibits no discharge. No scleral icterus.  Pupils are 1 mm bilaterally  Neck: Neck supple. No tracheal deviation present.  Cardiovascular: Normal rate, regular rhythm and intact distal pulses.   Pulmonary/Chest: Effort normal and breath sounds normal. No stridor. No respiratory distress. She has no wheezes. She has no rales.  Abdominal: Soft. Bowel sounds are normal. She exhibits no distension. There is no tenderness. There is no rebound and no guarding.  Musculoskeletal: She exhibits no edema or tenderness.  Neurological: She appears lethargic. No cranial nerve deficit (no obvious facial droop). She exhibits normal muscle tone. She displays no seizure activity. GCS eye subscore is 2. GCS verbal  subscore is 2. GCS motor subscore is 5.  The patient groans and moves her hand in response to painful stimuli, patient does not answer any questions, she does not follow any commands  Skin: Skin is warm and dry. No rash noted. She is not diaphoretic.  Psychiatric: She has a normal mood and affect.  Nursing note and vitals reviewed.   ED Course  Procedures (including critical care time) Labs Review Labs Reviewed  COMPREHENSIVE METABOLIC PANEL - Abnormal; Notable for the following:    Glucose, Bld 140 (*)    BUN 26 (*)  Total Protein 6.3 (*)    Albumin 2.7 (*)    GFR calc non Af Amer 49 (*)    GFR calc Af Amer 56 (*)    All other components within normal limits  CBC WITH DIFFERENTIAL/PLATELET - Abnormal; Notable for the following:    WBC 14.8 (*)    Neutrophils Relative % 79 (*)    Neutro Abs 11.7 (*)    Lymphocytes Relative 6 (*)    Monocytes Relative 15 (*)    Monocytes Absolute 2.2 (*)    All other components within normal limits  APTT  PROTIME-INR  AMMONIA  URINALYSIS, ROUTINE W REFLEX MICROSCOPIC (NOT AT [facility])  CBG MONITORING, ED  TYPE AND SCREEN    Imaging Review Dg Chest 1 View  01/07/2015   CLINICAL DATA:  Found down. Periorbital hematoma on the right with knee abrasions. History of dementia.  EXAM: 12/05/2014.  COMPARISON:  None.  FINDINGS: 1236 hr. Two views obtained. The heart size and mediastinal contours are normal. The lungs are clear. There is no pleural effusion or pneumothorax. No acute osseous findings are identified. Thoracic spine degenerative changes noted.  IMPRESSION: No active cardiopulmonary process.   Electronically Signed   By: Carey Bullocks M.D.   On: 01/07/2015 13:16   Dg Thoracic Spine 2 View  01/07/2015   CLINICAL DATA:  Found down. Right periorbital hematoma. History of dementia and back surgery.  EXAM: THORACIC SPINE 2 VIEWS  COMPARISON:  Chest radiographs 01/07/2015 and 12/05/2014.  FINDINGS: There are 12 rib-bearing thoracic type vertebral  bodies. The bones are mildly demineralized. There are mild degenerative changes throughout the thoracic spine with near anatomic alignment. No evidence of acute fracture or paraspinal hematoma.  IMPRESSION: No evidence of acute thoracic spine injury.   Electronically Signed   By: Carey Bullocks M.D.   On: 01/07/2015 13:18   Dg Lumbar Spine Complete  01/07/2015   CLINICAL DATA:  Per ED note: To room via EMS. Pt from [facility]. Pt is sedated and is usually kept this way, if not, pt kicks and screams. Staff found pt on floor. Hematoma above right eye, abrasion on right knee. H/o HTN, dementia, back surgery  EXAM: LUMBAR SPINE - COMPLETE 4+ VIEW  COMPARISON:  11/27/2014  FINDINGS: No fracture. No spondylolisthesis. Mild loss disc height from L2-L3 through L4-L5. Mild facet degenerative change in the lower lumbar spine. Bones are demineralized. Soft tissues show aortic vascular calcifications right quadrant surgical suture material.  IMPRESSION: 1. No fracture or acute finding. Degenerative changes, stable from the prior study.   Electronically Signed   By: Amie Portland M.D.   On: 01/07/2015 13:17   Ct Head Wo Contrast  01/07/2015   CLINICAL DATA:  Patient fell, found on floor, large hematoma right had  EXAM: CT HEAD WITHOUT CONTRAST  CT CERVICAL SPINE WITHOUT CONTRAST  TECHNIQUE: Multidetector CT imaging of the head and cervical spine was performed following the standard protocol without intravenous contrast. Multiplanar CT image reconstructions of the cervical spine were also generated.  COMPARISON:  12/05/2014  FINDINGS: CT HEAD FINDINGS  Large right frontal temporal scalp hematoma. No skull fracture. No intracranial hemorrhage or extra-axial fluid. Atrophy with microvascular changes in the deep white matter. No infarct mass or hydrocephalus.  CT CERVICAL SPINE FINDINGS  Normal alignment. No fracture or soft tissue abnormality. Multilevel degenerative disc disease. Lung apices clear.  IMPRESSION: Large  right scalp hematoma. No acute intracranial abnormalities. No evidence of cervical spine fracture.   Electronically  Signed   By: Esperanza Heir M.D.   On: 01/07/2015 13:32   Ct Cervical Spine Wo Contrast  01/07/2015   CLINICAL DATA:  Patient fell, found on floor, large hematoma right had  EXAM: CT HEAD WITHOUT CONTRAST  CT CERVICAL SPINE WITHOUT CONTRAST  TECHNIQUE: Multidetector CT imaging of the head and cervical spine was performed following the standard protocol without intravenous contrast. Multiplanar CT image reconstructions of the cervical spine were also generated.  COMPARISON:  12/05/2014  FINDINGS: CT HEAD FINDINGS  Large right frontal temporal scalp hematoma. No skull fracture. No intracranial hemorrhage or extra-axial fluid. Atrophy with microvascular changes in the deep white matter. No infarct mass or hydrocephalus.  CT CERVICAL SPINE FINDINGS  Normal alignment. No fracture or soft tissue abnormality. Multilevel degenerative disc disease. Lung apices clear.  IMPRESSION: Large right scalp hematoma. No acute intracranial abnormalities. No evidence of cervical spine fracture.   Electronically Signed   By: Esperanza Heir M.D.   On: 01/07/2015 13:32   Mr Brain Wo Contrast  01/08/2015   CLINICAL DATA:  81 year old female found lying down on ground near bed. Unwitnessed fall. Subsequent encounter.  EXAM: MRI HEAD WITHOUT CONTRAST  TECHNIQUE: Multiplanar, multiecho pulse sequences of the brain and surrounding structures were obtained without intravenous contrast.  COMPARISON:  01/07/2015 head CT.  10/24/2014 brain MR.  FINDINGS: Right frontal subcutaneous hematoma without evidence of intracranial hemorrhage.  No acute infarct.  Global atrophy without hydrocephalus.  Mild to moderate vessel disease type changes.  Major intracranial vascular structures are patent.  No intracranial mass lesion noted on this unenhanced exam.  Cervical medullary junction, pituitary region, pineal region and orbital  structures unremarkable.  Minimal mucosal thickening paranasal sinuses.  IMPRESSION: Right frontal subcutaneous hematoma without evidence of intracranial hemorrhage.  No acute infarct.  Global atrophy without hydrocephalus.  Mild to moderate vessel disease type changes.   Electronically Signed   By: Lacy Duverney M.D.   On: 01/08/2015 09:03   Dg Chest Port 1 View  01/08/2015   CLINICAL DATA:  Altered mental status.  EXAM: PORTABLE CHEST - 1 VIEW  COMPARISON:  01/07/2015.  FINDINGS: The heart size and mediastinal contours are within normal limits. Mild left base pleural parenchymal thickening noted consistent with mild scarring. Lungs are otherwise clear. The visualized skeletal structures are unremarkable.  IMPRESSION: Mild left base pleural parenchymal thickening again consistent mild scarring. No other significant abnormality .   Electronically Signed   By: Maisie Fus  Register   On: 01/08/2015 07:55   Dg Hips Bilat With Pelvis 3-4 Views  01/07/2015   CLINICAL DATA:  Per ED note: To room via EMS. Pt from [facility]. Pt is sedated and is usually kept this way, if not, pt kicks and screams. Staff found pt on floor. Hematoma above right eye, abrasion on right knee.  H/o HTN, dementia, back surgery  EXAM: DG HIP (WITH OR WITHOUT PELVIS) 3-4V BILAT  COMPARISON:  None.  FINDINGS: No fracture. No bone lesion. Bones demineralized. There is mild superior lateral joint space narrowing. No other arthropathic change. Soft tissues are unremarkable.  IMPRESSION: No fracture or acute finding.   Electronically Signed   By: Amie Portland M.D.   On: 01/07/2015 13:16   Medications  0.9 %  sodium chloride infusion (not administered)  cefTRIAXone (ROCEPHIN) 1 g in dextrose 5 % 50 mL IVPB (not administered)      MDM   Final diagnoses:  Altered mental status, unspecified altered mental status type  The patient's urinalysis from the other day was suggestive of a urinary tract infection. Patient was sent back to the  emergency room today for persistent somnolence. MRI of the brain does not show any evidence of stroke.  It is possible her symptoms may be related to the urinary tract infection. Considering her persistent change in mental status will start IV abx and will consult the medical service for admission.      Linwood Dibbles, MD 01/08/15 [id number]

## 2015-01-08 NOTE — Progress Notes (Signed)
Patient arrived on unit via stretcher from ED, family at bedside.  Patient lethargic, responds to pain.  Telemetry placed per MD order.  CMT notified.

## 2015-01-08 NOTE — Progress Notes (Signed)
ED called for report, spoke with Donnamae Jude, RN

## 2015-01-08 NOTE — H&P (Addendum)
[facility] History and Physical  WYNETTA SEITH JXB:[id number] DOB: Aug 28, 1920 DOA: 01/08/2015  Referring physician: Linwood Dibbles, M.D. PCP: Judie Bonus, MD   Chief Complaint: Acute encephalopathy  History provided by family at bedside and ED physician.  HPI:  81 year old female with severe Alzheimer's dementia (at baseline is very confused, often on able to identify her close family and not able to carry out a regular conversation, anterior resident of memory care unit), hypothyroidism, recurrent falls, who was brought to the ED for increased somnolence and encephalopathy for almost 2 weeks. Patient has worsening dementia and was combative at the facility about 4 weeks back which she required hospitalization to a psych facility in [place] and stayed there for almost 2 weeks. Patient was discharged from there to the memory care unit about 10 days back and was discharged on multiple sedating medications including Valium, Lexapro, trazodone, Risperdal and Depakote. Family reported that since they have seen her at this facility for the last 10 days patient has been falling asleep during conversations often and progressively been more confused and somnolent. She has not shown any aggressive behavior. She was brought to the ED yesterday as she was found on the floor next to her bed, increasingly somnolent and was found to have UTI. She was also hypothermic.  Give the head and cervical spine showed a large right scalp hematoma. Chest x-ray was unremarkable. The ED doctor spoke with the family on the phone and her son did not wish for any aggressive measures and wished to have her sent back to the facility. She was sent back there but patient was found increasingly somnolent.  Blood work repeated shows leukocytosis and mildly elevated BUN. Vitals were stable today (normal temperature) however patient remained somnolent and unarousable to sternal rub only. MRI of the brain was repeated with  concern of a possible stroke and was negative. It again showed a large right scalp hematoma. Patient started on IV Rocephin and hospitalist admission requested to medical floor.   Review of Systems:  As outlined in history of present illness. Patient somnolent and poorly arousable to provide any history. Also has severe underlying dementia   Past Medical History  Diagnosis Date  . Arthritis   . Chicken pox   . Chronic kidney disease   . Colon polyps   . Hypertension   . Dementia   . Angina   . Dysrhythmia   . Heart murmur   . Shortness of breath   . Recurrent upper respiratory infection (URI)   . Pneumonia   . Hypothyroidism   . Anemia   . Headache(784.0)   . Anxiety   . Depression   . Valvular heart disease 09/04/2012  . Left leg pain 10/02/2012  . UTI (urinary tract infection) 12/30/2012  . Acute bronchitis 04/10/2013  . Dehydration 08/31/2013  . Arthritis 10/02/2012   Past Surgical History  Procedure Laterality Date  . Appendectomy    . Cholecystectomy    . Kidney stones    . Tonsillectomy    . Abdominal hysterectomy    . Hand surgery    . Feet surgery    . Shoulder arthroscopy    . Back surgery    . Tonsillectomy    . Eye surgery      cataract removed and eye lids lifted  . Fracture surgery      bilateral arms  . Tubal ligation    . Colonoscopy w/ polypectomy     Social History:  reports that  she quit smoking about 73 years ago. Her smoking use included Cigarettes. She has a .2 pack-year smoking history. She has never used smokeless tobacco. She reports that she does not drink alcohol or use illicit drugs.  Allergies  Allergen Reactions  . Morphine And Related     Not noted on MAR  . Codeine Rash  . Hydrocodone Rash    Family History  Problem Relation Age of Onset  . Heart disease Mother   . Heart disease Father   . Heart disease Other   . Birth defects Other     Prior to Admission medications   Medication Sig Start Date End Date Taking? Authorizing  Provider  albuterol (PROVENTIL HFA;VENTOLIN HFA) 108 (90 BASE) MCG/ACT inhaler Inhale 2 puffs into the lungs every 6 (six) hours as needed for wheezing or shortness of breath. 09/23/13  Yes Sandford Craze, NP  aspirin 81 MG tablet Take 81 mg by mouth daily.   Yes Historical Provider, MD  diazepam (VALIUM) 5 MG tablet Take 5 mg by mouth daily after supper.   Yes Historical Provider, MD  divalproex (DEPAKOTE ER) 500 MG 24 hr tablet Take 500 mg by mouth 3 (three) times daily.   Yes Historical Provider, MD  escitalopram (LEXAPRO) 10 MG tablet Take 10 mg by mouth daily.   Yes Historical Provider, MD  lactulose (CHRONULAC) 10 GM/15ML solution Take 30 mLs (20 g total) by mouth 2 (two) times daily. 11/26/14  Yes Rhetta Mura, MD  levothyroxine (SYNTHROID, LEVOTHROID) 50 MCG tablet TAKE 1 TABLET (50 MCG TOTAL) BY MOUTH DAILY. 09/25/14  Yes Judie Bonus, MD  losartan (COZAAR) 50 MG tablet Take 1 tablet (50 mg total) by mouth daily. 10/28/14  Yes Catarina Hartshorn, MD  Magnesium 250 MG TABS Take 250 mg by mouth daily.    Yes Historical Provider, MD  risperiDONE (RISPERDAL) 1 MG tablet Take 1 mg by mouth at bedtime.   Yes Historical Provider, MD  traZODone (DESYREL) 50 MG tablet Take 50 mg by mouth at bedtime as needed for sleep.   Yes Historical Provider, MD  cephALEXin (KEFLEX) 500 MG capsule Take 1 capsule (500 mg total) by mouth 2 (two) times daily. Patient not taking: Reported on 01/08/2015 12/05/14   Layla Maw Ward, DO  ciprofloxacin (CIPRO) 500 MG tablet Take 1 tablet (500 mg total) by mouth 2 (two) times daily. One po bid x 7 days Patient not taking: Reported on 01/08/2015 01/07/15   Gilda Crease, MD  LORazepam (ATIVAN) 1 MG tablet Take 0.5 tablets (0.5 mg total) by mouth every 8 (eight) hours as needed for anxiety or sleep. Patient not taking: Reported on 01/08/2015 11/29/14   Zannie Cove, MD     Physical Exam:  Filed Vitals:   01/08/15 0745 01/08/15 0800 01/08/15 1002 01/08/15 1015  BP:  158/58 148/63 175/69 171/57  Pulse: 66 70  71  Resp: 15 19 11 13   SpO2: 97% 97%  97%    Constitutional: Vital signs reviewed.  Female lying in bed somnolent, arousable with moaning to sternal rub only HEENT: no pallor, no icterus, dry oral mucosa, no cervical lymphadenopathy Cardiovascular: RRR, S1 normal, S2 normal, no MRG Chest: CTAB, no wheezes, rales, or rhonchi Abdominal: Soft. Non-tender, non-distended, bowel sounds are normal, Ext: warm, no edema Neurological: Somnolent, poorly arousable to sternal rub only, moving all extremities in response to painful stimuli  Labs on Admission:  Basic Metabolic Panel:  Recent Labs Lab 01/07/15 1230 01/08/15 0800  NA 141 140  K 5.0 4.9  CL 102 102  CO2 30 28  GLUCOSE 108* 140*  BUN 33* 26*  CREATININE 0.85 0.98  CALCIUM 9.0 9.0   Liver Function Tests:  Recent Labs Lab 01/07/15 1230 01/08/15 0800  AST 19 20  ALT 16 16  ALKPHOS 78 81  BILITOT 0.5 0.4  PROT 6.2* 6.3*  ALBUMIN 2.9* 2.7*   No results for input(s): LIPASE, AMYLASE in the last 168 hours.  Recent Labs Lab 01/08/15 0800  AMMONIA 23   CBC:  Recent Labs Lab 01/07/15 1230 01/08/15 0800  WBC 12.4* 14.8*  NEUTROABS 9.7* 11.7*  HGB 12.3 12.8  HCT 37.3 38.1  MCV 88.8 89.0  PLT 179 177   Cardiac Enzymes:  Recent Labs Lab 01/07/15 1458  TROPONINI <0.03   BNP: Invalid input(s): POCBNP CBG: No results for input(s): GLUCAP in the last 168 hours.  Radiological Exams on Admission: Dg Chest 1 View  01/07/2015   CLINICAL DATA:  Found down. Periorbital hematoma on the right with knee abrasions. History of dementia.  EXAM: 12/05/2014.  COMPARISON:  None.  FINDINGS: 1236 hr. Two views obtained. The heart size and mediastinal contours are normal. The lungs are clear. There is no pleural effusion or pneumothorax. No acute osseous findings are identified. Thoracic spine degenerative changes noted.  IMPRESSION: No active cardiopulmonary process.   Electronically  Signed   By: Carey Bullocks M.D.   On: 01/07/2015 13:16   Dg Thoracic Spine 2 View  01/07/2015   CLINICAL DATA:  Found down. Right periorbital hematoma. History of dementia and back surgery.  EXAM: THORACIC SPINE 2 VIEWS  COMPARISON:  Chest radiographs 01/07/2015 and 12/05/2014.  FINDINGS: There are 12 rib-bearing thoracic type vertebral bodies. The bones are mildly demineralized. There are mild degenerative changes throughout the thoracic spine with near anatomic alignment. No evidence of acute fracture or paraspinal hematoma.  IMPRESSION: No evidence of acute thoracic spine injury.   Electronically Signed   By: Carey Bullocks M.D.   On: 01/07/2015 13:18   Dg Lumbar Spine Complete  01/07/2015   CLINICAL DATA:  Per ED note: To room via EMS. Pt from [facility]. Pt is sedated and is usually kept this way, if not, pt kicks and screams. Staff found pt on floor. Hematoma above right eye, abrasion on right knee. H/o HTN, dementia, back surgery  EXAM: LUMBAR SPINE - COMPLETE 4+ VIEW  COMPARISON:  11/27/2014  FINDINGS: No fracture. No spondylolisthesis. Mild loss disc height from L2-L3 through L4-L5. Mild facet degenerative change in the lower lumbar spine. Bones are demineralized. Soft tissues show aortic vascular calcifications right quadrant surgical suture material.  IMPRESSION: 1. No fracture or acute finding. Degenerative changes, stable from the prior study.   Electronically Signed   By: Amie Portland M.D.   On: 01/07/2015 13:17   Ct Head Wo Contrast  01/07/2015   CLINICAL DATA:  Patient fell, found on floor, large hematoma right had  EXAM: CT HEAD WITHOUT CONTRAST  CT CERVICAL SPINE WITHOUT CONTRAST  TECHNIQUE: Multidetector CT imaging of the head and cervical spine was performed following the standard protocol without intravenous contrast. Multiplanar CT image reconstructions of the cervical spine were also generated.  COMPARISON:  12/05/2014  FINDINGS: CT HEAD FINDINGS  Large right frontal temporal  scalp hematoma. No skull fracture. No intracranial hemorrhage or extra-axial fluid. Atrophy with microvascular changes in the deep white matter. No infarct mass or hydrocephalus.  CT CERVICAL SPINE FINDINGS  Normal alignment. No fracture or  soft tissue abnormality. Multilevel degenerative disc disease. Lung apices clear.  IMPRESSION: Large right scalp hematoma. No acute intracranial abnormalities. No evidence of cervical spine fracture.   Electronically Signed   By: Esperanza Heir M.D.   On: 01/07/2015 13:32   Ct Cervical Spine Wo Contrast  01/07/2015   CLINICAL DATA:  Patient fell, found on floor, large hematoma right had  EXAM: CT HEAD WITHOUT CONTRAST  CT CERVICAL SPINE WITHOUT CONTRAST  TECHNIQUE: Multidetector CT imaging of the head and cervical spine was performed following the standard protocol without intravenous contrast. Multiplanar CT image reconstructions of the cervical spine were also generated.  COMPARISON:  12/05/2014  FINDINGS: CT HEAD FINDINGS  Large right frontal temporal scalp hematoma. No skull fracture. No intracranial hemorrhage or extra-axial fluid. Atrophy with microvascular changes in the deep white matter. No infarct mass or hydrocephalus.  CT CERVICAL SPINE FINDINGS  Normal alignment. No fracture or soft tissue abnormality. Multilevel degenerative disc disease. Lung apices clear.  IMPRESSION: Large right scalp hematoma. No acute intracranial abnormalities. No evidence of cervical spine fracture.   Electronically Signed   By: Esperanza Heir M.D.   On: 01/07/2015 13:32   Mr Brain Wo Contrast  01/08/2015   CLINICAL DATA:  81 year old female found lying down on ground near bed. Unwitnessed fall. Subsequent encounter.  EXAM: MRI HEAD WITHOUT CONTRAST  TECHNIQUE: Multiplanar, multiecho pulse sequences of the brain and surrounding structures were obtained without intravenous contrast.  COMPARISON:  01/07/2015 head CT.  10/24/2014 brain MR.  FINDINGS: Right frontal subcutaneous hematoma  without evidence of intracranial hemorrhage.  No acute infarct.  Global atrophy without hydrocephalus.  Mild to moderate vessel disease type changes.  Major intracranial vascular structures are patent.  No intracranial mass lesion noted on this unenhanced exam.  Cervical medullary junction, pituitary region, pineal region and orbital structures unremarkable.  Minimal mucosal thickening paranasal sinuses.  IMPRESSION: Right frontal subcutaneous hematoma without evidence of intracranial hemorrhage.  No acute infarct.  Global atrophy without hydrocephalus.  Mild to moderate vessel disease type changes.   Electronically Signed   By: Lacy Duverney M.D.   On: 01/08/2015 09:03   Dg Chest Port 1 View  01/08/2015   CLINICAL DATA:  Altered mental status.  EXAM: PORTABLE CHEST - 1 VIEW  COMPARISON:  01/07/2015.  FINDINGS: The heart size and mediastinal contours are within normal limits. Mild left base pleural parenchymal thickening noted consistent with mild scarring. Lungs are otherwise clear. The visualized skeletal structures are unremarkable.  IMPRESSION: Mild left base pleural parenchymal thickening again consistent mild scarring. No other significant abnormality .   Electronically Signed   By: Maisie Fus  Register   On: 01/08/2015 07:55   Dg Hips Bilat With Pelvis 3-4 Views  01/07/2015   CLINICAL DATA:  Per ED note: To room via EMS. Pt from [facility]. Pt is sedated and is usually kept this way, if not, pt kicks and screams. Staff found pt on floor. Hematoma above right eye, abrasion on right knee.  H/o HTN, dementia, back surgery  EXAM: DG HIP (WITH OR WITHOUT PELVIS) 3-4V BILAT  COMPARISON:  None.  FINDINGS: No fracture. No bone lesion. Bones demineralized. There is mild superior lateral joint space narrowing. No other arthropathic change. Soft tissues are unremarkable.  IMPRESSION: No fracture or acute finding.   Electronically Signed   By: Amie Portland M.D.   On: 01/07/2015 13:16    EKG: A. fib at 57 with  RBBB, old changes  Assessment/Plan  Principal  Problem: Acute metabolic encephalopathy Likely a combination of multiple anti-psychotic medications (started on 5 different new sedative and antipsychotic  medication) started at outside facility for her combative behavior, underlying UTI and dehydration worsening her underlying severe dementia. Admit to MedSurg. -Hold all home medications, and allow the meds to clear from her system. Continue neuro checks. -IV hydration with D5NS and empiric Rocephin for UTI. Follow urine culture. Head CT and MRI negative for any intracranial bleed or stroke.    UTI (urinary tract infection) Empiric Rocephin. Follow culture   Active Problems:   Hypothyroidism  IV Synthroid.  Severe Alzheimer's dementia with delirium and agitation as outpatient Hospitalized at the psychiatric facility in [place] for 2 weeks recently and placed on multiple medications. Family not interested in aggressive measures. Discussed withholding all her medications to allow them to clear from her system given her acute encephalopathy and the reformatted.  Failure to thrive and recurrent falls Recent hospitalizations for recurrent falls. PT evaluation once mental status improves. Currently a resident of memory care unit at [facility].    Diet:NPO  DVT prophylaxis: sq lovenox   Code Status: DNR Family Communication: discussed with son and daughter  at bedside Disposition Plan: admi tto med surg  Eddie North [facility] Pager [phone]  Total time spent on admission :70 minutes  If 7PM-7AM, please contact night-coverage [email or web address] Password [email or web address] 01/08/2015, 11:18 AM

## 2015-01-08 NOTE — ED Notes (Signed)
Patient transported to MRI 

## 2015-01-08 NOTE — ED Notes (Signed)
Received pt via EMS with c/o found unresponsive by nursing home staff. Pt fell yesterday and was seen at here. Pt given 1 mg of Narcan by EMS with no change.

## 2015-01-09 LAB — BASIC METABOLIC PANEL
Anion gap: 7 (ref 5–15)
BUN: 23 mg/dL — ABNORMAL HIGH (ref 6–20)
CALCIUM: 9 mg/dL (ref 8.9–10.3)
CO2: 29 mmol/L (ref 22–32)
Chloride: 104 mmol/L (ref 101–111)
Creatinine, Ser: 0.78 mg/dL (ref 0.44–1.00)
GFR calc non Af Amer: >60 mL/min (ref >60)
Glucose, Bld: 122 mg/dL — ABNORMAL HIGH (ref 65–99)
POTASSIUM: 4.5 mmol/L (ref 3.5–5.1)
Sodium: 140 mmol/L (ref 135–145)

## 2015-01-09 LAB — GLUCOSE, CAPILLARY: Glucose-Capillary: 107 mg/dL — ABNORMAL HIGH (ref 65–99)

## 2015-01-09 LAB — CBC
HEMATOCRIT: 36.5 % (ref 36.0–46.0)
HEMOGLOBIN: 11.9 g/dL — AB (ref 12.0–15.0)
MCH: 29.4 pg (ref 26.0–34.0)
MCHC: 32.6 g/dL (ref 30.0–36.0)
MCV: 90.1 fL (ref 78.0–100.0)
Platelets: 163 10*3/uL (ref 150–400)
RBC: 4.05 MIL/uL (ref 3.87–5.11)
RDW: 12.8 % (ref 11.5–15.5)
WBC: 12.9 10*3/uL — AB (ref 4.0–10.5)

## 2015-01-09 MED ORDER — SCOPOLAMINE 1 MG/3DAYS TD PT72
1.0000 | MEDICATED_PATCH | TRANSDERMAL | Status: DC
  Administered 2015-01-09 – 2015-01-15 (×3): 1.5 mg via TRANSDERMAL
  Filled 2015-01-09 (×3): qty 1

## 2015-01-09 NOTE — Progress Notes (Signed)
PROGRESS NOTE  [name]:[id number] DOB: 08/21/1920 DOA: 01/08/2015 PCP: Judie Bonus, MD  HPI/Recap of past 5 hours: 81 year old patient with severe Alzheimer's dementia, hypothyroidism and agitation who had been discharged from a psychiatric facility after being there for several weeks for being combative, I spent the last 2 weeks having increased somnolence and confusion. Patient had been discharged from the psychiatric facility on a number of sedating medications including Valium, Lexapro, trazodone, Risperdal and Depakote. With her progressive somnolence and confusion, she was brought into the emergency room on 8/8. Note: She had not shown any signs of aggressive behavior. In the emergency room, a shot found to be hypothermic and have a UTI as well as a large right scalp hematoma from a fall.  In discussion with family, they did not want any aggressive interventions. Patient was made DO NOT RESUSCITATE. After discussion with family, a mutually agreed upon plan would be to treat her urinary tract infection and hold her sedating medications and see how she responds.  Today, patient is only minimally more awake. Not able to give me any review of systems.  Assessment/Plan: Principal Problem:   UTI (urinary tract infection): No growth to date. Continue IV Rocephin Active Problems:   Hypothyroidism: Continue Synthroid    FTT (failure to thrive) in adult   Dehydration    Dementia in Alzheimer's disease with delirium: Holding sedating medications. Patient's son has indicated to nursing that he is making peace with everything that is going on and is leaning toward comfort care   Code Status: DO NOT RESUSCITATE  Family Communication: Left message with son  Disposition Plan: Possible comfort care, likely determination within next 24 hours   Consultants:  None  Procedures:  None  Antibiotics:  IV Rocephin   Objective: BP 134/48 mmHg  Pulse 82  Temp(Src) 97.9 F  (36.6 C) (Axillary)  Resp 14  Wt 80.4 kg (177 lb 4 oz)  SpO2 90%  Intake/Output Summary (Last 24 hours) at 01/09/15 2004 Last data filed at 01/09/15 1400  Gross per 24 hour  Intake     50 ml  Output      0 ml  Net     50 ml   Filed Weights   01/08/15 1600  Weight: 80.4 kg (177 lb 4 oz)    Exam:   General:  Somnolent  Cardiovascular: Regular rate and rhythm, S1-S2  Respiratory: Clear to auscultation bilaterally  Abdomen: Soft, nontender?, nondistended, positive bowel sounds  Musculoskeletal: No edema   Data Reviewed: Basic Metabolic Panel:  Recent Labs Lab 01/07/15 1230 01/08/15 0800 01/09/15 0625  NA 141 140 140  K 5.0 4.9 4.5  CL 102 102 104  CO2 30 28 29   GLUCOSE 108* 140* 122*  BUN 33* 26* 23*  CREATININE 0.85 0.98 0.78  CALCIUM 9.0 9.0 9.0   Liver Function Tests:  Recent Labs Lab 01/07/15 1230 01/08/15 0800  AST 19 20  ALT 16 16  ALKPHOS 78 81  BILITOT 0.5 0.4  PROT 6.2* 6.3*  ALBUMIN 2.9* 2.7*   No results for input(s): LIPASE, AMYLASE in the last 168 hours.  Recent Labs Lab 01/08/15 0800  AMMONIA 23   CBC:  Recent Labs Lab 01/07/15 1230 01/08/15 0800 01/09/15 0625  WBC 12.4* 14.8* 12.9*  NEUTROABS 9.7* 11.7*  --   HGB 12.3 12.8 11.9*  HCT 37.3 38.1 36.5  MCV 88.8 89.0 90.1  PLT 179 177 163   Cardiac Enzymes:    Recent Labs  Lab 01/07/15 1458  TROPONINI <0.03   BNP (last 3 results) No results for input(s): BNP in the last 8760 hours.  ProBNP (last 3 results) No results for input(s): PROBNP in the last 8760 hours.  CBG:  Recent Labs Lab 01/08/15 1122 01/09/15 0802  GLUCAP 109* 107*    Recent Results (from the past 240 hour(s))  Urine culture     Status: None   Collection Time: 01/07/15  1:25 PM  Result Value Ref Range Status   Specimen Description URINE, CATHETERIZED  Final   Special Requests NONE  Final   Culture NO GROWTH 1 DAY  Final   Report Status 01/08/2015 FINAL  Final  Culture, blood (routine x  2)     Status: None (Preliminary result)   Collection Time: 01/07/15  2:48 PM  Result Value Ref Range Status   Specimen Description BLOOD RIGHT FOREARM  Final   Special Requests BOTTLES DRAWN AEROBIC AND ANAEROBIC 5CC  Final   Culture NO GROWTH 2 DAYS  Final   Report Status PENDING  Incomplete  Culture, blood (routine x 2)     Status: None (Preliminary result)   Collection Time: 01/07/15  2:58 PM  Result Value Ref Range Status   Specimen Description BLOOD RIGHT HAND  Final   Special Requests BOTTLES DRAWN AEROBIC AND ANAEROBIC 3CC  Final   Culture NO GROWTH 2 DAYS  Final   Report Status PENDING  Incomplete  MRSA PCR Screening     Status: None   Collection Time: 01/08/15 12:20 PM  Result Value Ref Range Status   MRSA by PCR NEGATIVE NEGATIVE Final    Comment:        The GeneXpert MRSA Assay (FDA approved for NASAL specimens only), is one component of a comprehensive MRSA colonization surveillance program. It is not intended to diagnose MRSA infection nor to guide or monitor treatment for MRSA infections.      Studies: No results found.  Scheduled Meds: . antiseptic oral rinse  7 mL Mouth Rinse BID  . cefTRIAXone (ROCEPHIN)  IV  1 g Intravenous Q24H  . enoxaparin (LOVENOX) injection  40 mg Subcutaneous Daily  . levothyroxine  25 mcg Intravenous Daily  . scopolamine  1 patch Transdermal Q72H    Continuous Infusions:    Time spent: 15 minutes  Hollice Espy  [facility] Pager [phone]. If 7PM-7AM, please contact night-coverage at [email or web address], password [facility] 01/09/2015, 8:04 PM  LOS: 1 day

## 2015-01-09 NOTE — Progress Notes (Signed)
RT accessment found patient needing to be suctioned by way of NTS.  RT suctioned patient by NTS and orally. RN notified. RT will continue to monitor.

## 2015-01-09 NOTE — Code Documentation (Signed)
Called by the primary RN to see pt for copious secretions.  On arrival to the room to see the pt she was dusky & gurgling.  I requested the RN get a NTS kit while I deep orally suctioned the pt. I obtained a large amount of thin white foamy secretions.  Once the NTS kit was available I was able o NTS and obtained more thin white secretions.  Pt has no gag.  O2 stats improved dramatically.  Pt is obtunded and has been since arriving on the floor.Prior to leaving the room the pt was oxygenating well and her color had improved significantly. 

## 2015-01-09 NOTE — Clinical Social Work Note (Signed)
Clinical Social Work Assessment  Patient Details  Name: [name] MRN: [id number] Date of Birth: 1920-07-05  Date of referral:  01/08/15               Reason for consult:  Facility Placement (Patient from [facility])                Permission sought to share information with:  Facility Industrial/product designer granted to share information::  Yes, Verbal Permission Granted  Name::     Admissions or Nursing Staff at [name]  Agency::     Relationship::     Contact Information:     Housing/Transportation Living arrangements for the past 2 months:  Assisted Living Facility Source of Information:  Adult Children (Son [name] [phone])) Patient Interpreter Needed:  None Criminal Activity/Legal Involvement Pertinent to Current Situation/Hospitalization:  No - Comment as needed Significant Relationships:  Adult Children Lives with:  Facility Resident Do you feel safe going back to the place where you live?  Yes Need for family participation in patient care:  Yes (Comment) (Patient has Dementia)  Care giving concerns:  None noted by patient's son.   Social Worker assessment / plan:  CSW talked by phone with [name], patient's son regarding discharge plans. Mr. Harrison Mons confirmed that patient came to hospital from [facility] Facility and will return there at discharge. Mr. Harrison Mons indicated that he or his wife [name] [phone]) can be contacted.    Employment status:  Retired Database administrator, [place] PT Recommendations:  Not assessed at this time Information / Referral to community resources:  Other (Comment Required) (None needed or requested at this time)  Patient/Family's Response to care:  No concerns noted.  Patient/Family's Understanding of and Emotional Response to Diagnosis, Current Treatment, and Prognosis: Not discussed.  Emotional  Assessment Appearance:  Other (Comment Required (Did not visit/talk with patient as she is disoriented x4) Attitude/Demeanor/Rapport:  Unable to Assess Affect (typically observed):  Unable to Assess Orientation:   (Patient disoriented x4) Alcohol / Substance use:  Tobacco Use (Patient quit smoking and does not consume alcohol or use illicit drugs.) Psych involvement (Current and /or in the community):  No (Comment)  Discharge Needs  Concerns to be addressed:  No discharge needs identified Readmission within the last 30 days:  No Current discharge risk:  None Barriers to Discharge:  No Barriers Identified   Cristobal Goldmann, LCSW 01/09/2015, 5:17 PM

## 2015-01-09 NOTE — Progress Notes (Signed)
RN called to patient's room by nurse tech to come and assess patient. Patient was found with a lot of white foamy (copius) secretions from the mouth. She remain obtunded, responding only to sternal rubs. Vital signs significant for O2 sat of 88% on 2 liters and pulse of 114. Oral suction performed by RN. MD and Rapid RN notified. Respiratory therapy also notified for possible deep nasal suction. Will continue to monitor.

## 2015-01-10 ENCOUNTER — Inpatient Hospital Stay (HOSPITAL_COMMUNITY): Payer: Commercial Managed Care - HMO

## 2015-01-10 DIAGNOSIS — J690 Pneumonitis due to inhalation of food and vomit: Secondary | ICD-10-CM | POA: Clinically undetermined

## 2015-01-10 LAB — CBC
HCT: 37.1 % (ref 36.0–46.0)
Hemoglobin: 12.1 g/dL (ref 12.0–15.0)
MCH: 29.7 pg (ref 26.0–34.0)
MCHC: 32.6 g/dL (ref 30.0–36.0)
MCV: 90.9 fL (ref 78.0–100.0)
PLATELETS: 162 10*3/uL (ref 150–400)
RBC: 4.08 MIL/uL (ref 3.87–5.11)
RDW: 13.1 % (ref 11.5–15.5)
WBC: 24.3 10*3/uL — ABNORMAL HIGH (ref 4.0–10.5)

## 2015-01-10 MED ORDER — MORPHINE SULFATE 25 MG/ML IV SOLN
1.0000 mg/h | INTRAVENOUS | Status: DC
  Administered 2015-01-10: 1 mg/h via INTRAVENOUS
  Filled 2015-01-10: qty 10

## 2015-01-10 MED ORDER — LORAZEPAM 2 MG/ML IJ SOLN
0.5000 mg | INTRAMUSCULAR | Status: DC | PRN
  Administered 2015-01-13 – 2015-01-15 (×3): 0.5 mg via INTRAVENOUS
  Filled 2015-01-10 (×3): qty 1

## 2015-01-10 NOTE — Progress Notes (Signed)
01/10/2015 6:11 PM  Spoke with patient son over the phone for patient update. Leonette Most (son) verbalized that all his questions were answered. Will continue to monitor and assess the patient.  [employer], RN-BC, [employer] [facility] Phone [phone]

## 2015-01-10 NOTE — Progress Notes (Signed)
PROGRESS NOTE  [name] ZOX:[id number] DOB: Feb 04, 1921 DOA: 01/08/2015 PCP: Judie Bonus, MD  HPI/Recap of past 21 hours: 81 year old patient with severe Alzheimer's dementia, hypothyroidism and agitation who had been discharged from a psychiatric facility after being there for several weeks for being combative, I spent the last 2 weeks having increased somnolence and confusion. Patient had been discharged from the psychiatric facility on a number of sedating medications including Valium, Lexapro, trazodone, Risperdal and Depakote. With her progressive somnolence and confusion, she was brought into the emergency room on 8/8. Note: She had not shown any signs of aggressive behavior. In the emergency room, patient found to be hypothermic and have a UTI as well as a large right scalp hematoma from a fall.   On morning of 8/10, patient's white blood cell count markedly elevated at 24. There have been reports at the nursing facility that she was having a large amount of secretions and she required continuous suctioning here in the hospital. Suspicion for aspiration pneumonia confirmed by x-ray. For treatment initiated, had an extensive discussion with the son. Prior to the aspiration pneumonia he was already considering making her comfort care. Following discovery of this severe pneumonia, he felt that he did not want his mother to go through this and did not want her suffering prolonged. Patient already a DO NOT RESUSCITATE.    Rocephin from UTI stopped. All the medications stopped except for when necessary IV Ativan for agitation and a morphine drip for ease of breathing.  Assessment/Plan: Principal Problem:   Aspiration pneumonia: Not treating. Patient now comfort care Active Problems:   Hypothyroidism: Synthroid stopped   Dementia   FTT (failure to thrive) in adult   Dehydration   Acute encephalopathy   UTI (urinary tract infection): Rocephin stopped   Dementia in Alzheimer's disease  with delirium   Code Status: DNR, now comfort care  Family Communication: spoke with son & daughter-in law by phone who confirmed that patient should be made comfort care  Disposition Plan: Likely will pass within the next few days   Consultants:  None  Procedures:  None  Antibiotics:  IV Rocephin 8/8-present   Objective: BP 104/91 mmHg  Pulse 80  Temp(Src) 98.7 F (37.1 C) (Oral)  Resp 17  Ht 5\' 5"  (1.651 m)  Wt 80.4 kg (177 lb 4 oz)  SpO2 96%  Intake/Output Summary (Last 24 hours) at 01/10/15 1835 Last data filed at 01/10/15 1400  Gross per 24 hour  Intake     80 ml  Output      0 ml  Net     80 ml   Filed Weights   01/08/15 1600  Weight: 80.4 kg (177 lb 4 oz)    Exam:   General:  Minimal responsiveness  Cardiovascular: regular rate and rhythm  Respiratory: scattered rales  Abdomen: soft, nondistended, positive bowel sounds  Musculoskeletal: no clubbing or cyanosis, trace edema   Data Reviewed: Basic Metabolic Panel:  Recent Labs Lab 01/07/15 1230 01/08/15 0800 01/09/15 0625  NA 141 140 140  K 5.0 4.9 4.5  CL 102 102 104  CO2 30 28 29   GLUCOSE 108* 140* 122*  BUN 33* 26* 23*  CREATININE 0.85 0.98 0.78  CALCIUM 9.0 9.0 9.0   Liver Function Tests:  Recent Labs Lab 01/07/15 1230 01/08/15 0800  AST 19 20  ALT 16 16  ALKPHOS 78 81  BILITOT 0.5 0.4  PROT 6.2* 6.3*  ALBUMIN 2.9* 2.7*   No results  for input(s): LIPASE, AMYLASE in the last 168 hours.  Recent Labs Lab 01/08/15 0800  AMMONIA 23   CBC:  Recent Labs Lab 01/07/15 1230 01/08/15 0800 01/09/15 0625 01/10/15 0517  WBC 12.4* 14.8* 12.9* 24.3*  NEUTROABS 9.7* 11.7*  --   --   HGB 12.3 12.8 11.9* 12.1  HCT 37.3 38.1 36.5 37.1  MCV 88.8 89.0 90.1 90.9  PLT 179 177 163 162   Cardiac Enzymes:    Recent Labs Lab 01/07/15 1458  TROPONINI <0.03   BNP (last 3 results) No results for input(s): BNP in the last 8760 hours.  ProBNP (last 3 results) No results  for input(s): PROBNP in the last 8760 hours.  CBG:  Recent Labs Lab 01/08/15 1122 01/09/15 0802  GLUCAP 109* 107*    Recent Results (from the past 240 hour(s))  Urine culture     Status: None   Collection Time: 01/07/15  1:25 PM  Result Value Ref Range Status   Specimen Description URINE, CATHETERIZED  Final   Special Requests NONE  Final   Culture NO GROWTH 1 DAY  Final   Report Status 01/08/2015 FINAL  Final  Culture, blood (routine x 2)     Status: None (Preliminary result)   Collection Time: 01/07/15  2:48 PM  Result Value Ref Range Status   Specimen Description BLOOD RIGHT FOREARM  Final   Special Requests BOTTLES DRAWN AEROBIC AND ANAEROBIC 5CC  Final   Culture NO GROWTH 3 DAYS  Final   Report Status PENDING  Incomplete  Culture, blood (routine x 2)     Status: None (Preliminary result)   Collection Time: 01/07/15  2:58 PM  Result Value Ref Range Status   Specimen Description BLOOD RIGHT HAND  Final   Special Requests BOTTLES DRAWN AEROBIC AND ANAEROBIC 3CC  Final   Culture NO GROWTH 3 DAYS  Final   Report Status PENDING  Incomplete  MRSA PCR Screening     Status: None   Collection Time: 01/08/15 12:20 PM  Result Value Ref Range Status   MRSA by PCR NEGATIVE NEGATIVE Final    Comment:        The GeneXpert MRSA Assay (FDA approved for NASAL specimens only), is one component of a comprehensive MRSA colonization surveillance program. It is not intended to diagnose MRSA infection nor to guide or monitor treatment for MRSA infections.      Studies: Dg Chest Port 1 View  01/10/2015   CLINICAL DATA:  Aspiration pneumonia.  EXAM: PORTABLE CHEST - 1 VIEW  COMPARISON:  January 08, 2015.  FINDINGS: The heart size and mediastinal contours are within normal limits. No pneumothorax is noted. Severe degenerative change of right glenohumeral joint is noted. Left lung is clear. New right basilar opacity is noted concerning for aspiration pneumonia with possible associated  pleural effusion.  IMPRESSION: New right lower lobe opacity is noted consistent with aspiration pneumonia.   Electronically Signed   By: Lupita Raider, M.D.   On: 01/10/2015 12:15    Scheduled Meds: . scopolamine  1 patch Transdermal Q72H    Continuous Infusions: . morphine       Time spent: 25 minutes  Hollice Espy  [facility] Pager [phone]. If 7PM-7AM, please contact night-coverage at [email or web address], password [facility] 01/10/2015, 6:35 PM  LOS: 2 days

## 2015-01-10 NOTE — Care Management Important Message (Signed)
Important Message  Patient Details  Name: [name] MRN: [id number] Date of Birth: [birth year]   Medicare Important Message Given:  Yes-second notification given    Orson Aloe 01/10/2015, 11:30 AM

## 2015-01-11 ENCOUNTER — Inpatient Hospital Stay (HOSPITAL_COMMUNITY): Payer: Commercial Managed Care - HMO

## 2015-01-11 DIAGNOSIS — J690 Pneumonitis due to inhalation of food and vomit: Principal | ICD-10-CM

## 2015-01-11 DIAGNOSIS — F05 Delirium due to known physiological condition: Secondary | ICD-10-CM

## 2015-01-11 DIAGNOSIS — G309 Alzheimer's disease, unspecified: Secondary | ICD-10-CM

## 2015-01-11 DIAGNOSIS — E039 Hypothyroidism, unspecified: Secondary | ICD-10-CM

## 2015-01-11 DIAGNOSIS — F0280 Dementia in other diseases classified elsewhere without behavioral disturbance: Secondary | ICD-10-CM

## 2015-01-11 LAB — BASIC METABOLIC PANEL
Anion gap: 11 (ref 5–15)
BUN: 41 mg/dL — AB (ref 6–20)
CO2: 28 mmol/L (ref 22–32)
CREATININE: 1.05 mg/dL — AB (ref 0.44–1.00)
Calcium: 9 mg/dL (ref 8.9–10.3)
Chloride: 103 mmol/L (ref 101–111)
GFR calc Af Amer: 52 mL/min — ABNORMAL LOW (ref >60)
GFR, EST NON AFRICAN AMERICAN: 45 mL/min — AB (ref >60)
Glucose, Bld: 96 mg/dL (ref 65–99)
Potassium: 4 mmol/L (ref 3.5–5.1)
SODIUM: 142 mmol/L (ref 135–145)

## 2015-01-11 LAB — CBC
HCT: 31.3 % — ABNORMAL LOW (ref 36.0–46.0)
Hemoglobin: 10.3 g/dL — ABNORMAL LOW (ref 12.0–15.0)
MCH: 29.8 pg (ref 26.0–34.0)
MCHC: 32.9 g/dL (ref 30.0–36.0)
MCV: 90.5 fL (ref 78.0–100.0)
PLATELETS: 178 10*3/uL (ref 150–400)
RBC: 3.46 MIL/uL — ABNORMAL LOW (ref 3.87–5.11)
RDW: 13 % (ref 11.5–15.5)
WBC: 19.2 10*3/uL — AB (ref 4.0–10.5)

## 2015-01-11 MED ORDER — DEXTROSE 5 % IV SOLN
1.0000 g | INTRAVENOUS | Status: DC
  Administered 2015-01-11 – 2015-01-14 (×4): 1 g via INTRAVENOUS
  Filled 2015-01-11 (×5): qty 10

## 2015-01-11 MED ORDER — DEXTROSE 5 % IV SOLN
500.0000 mg | INTRAVENOUS | Status: DC
  Administered 2015-01-11 – 2015-01-14 (×4): 500 mg via INTRAVENOUS
  Filled 2015-01-11 (×5): qty 500

## 2015-01-11 MED ORDER — CLONAZEPAM 0.5 MG PO TABS
0.5000 mg | ORAL_TABLET | Freq: Two times a day (BID) | ORAL | Status: DC
  Administered 2015-01-11 – 2015-01-13 (×5): 0.5 mg via ORAL
  Filled 2015-01-11 (×5): qty 1

## 2015-01-11 NOTE — Progress Notes (Signed)
Morphine Sulfate iv 150 cc wasted on the sink as witnessed by Burley Saver R.N.

## 2015-01-11 NOTE — Progress Notes (Signed)
ANTIBIOTIC CONSULT NOTE - INITIAL  Pharmacy Consult for Levaquin Indication: PNA  Allergies  Allergen Reactions  . Morphine And Related     Not noted on MAR  . Codeine Rash  . Hydrocodone Rash    Patient Measurements: Height:  (165.1 cm) Weight: 173 lb 11.6 oz (78.8 kg) IBW/kg (Calculated) : 57 Adjusted Body Weight:    Vital Signs: Temp: 98.2 F (36.8 C) (08/11 1023) Temp Source: Oral (08/11 1023) BP: 133/75 mmHg (08/11 1023) Pulse Rate: 75 (08/11 1023) Intake/Output from previous day: 08/10 0701 - 08/11 0700 In: 89.4 [I.V.:9.4; IV Piggyback:50] Out: 0  Intake/Output from this shift:    Labs:  Recent Labs  01/09/15 0625 01/10/15 0517 01/11/15 1303  WBC 12.9* 24.3* 19.2*  HGB 11.9* 12.1 10.3*  PLT 163 162 178  CREATININE 0.78  --   --    Estimated Creatinine Clearance: 46.5 mL/min (by C-G formula based on Cr of 0.78). No results for input(s): VANCOTROUGH, VANCOPEAK, VANCORANDOM, GENTTROUGH, GENTPEAK, GENTRANDOM, TOBRATROUGH, TOBRAPEAK, TOBRARND, AMIKACINPEAK, AMIKACINTROU, AMIKACIN in the last 72 hours.   Microbiology: Recent Results (from the past 720 hour(s))  Urine culture     Status: None   Collection Time: 01/07/15  1:25 PM  Result Value Ref Range Status   Specimen Description URINE, CATHETERIZED  Final   Special Requests NONE  Final   Culture NO GROWTH 1 DAY  Final   Report Status 01/08/2015 FINAL  Final  Culture, blood (routine x 2)     Status: None (Preliminary result)   Collection Time: 01/07/15  2:48 PM  Result Value Ref Range Status   Specimen Description BLOOD RIGHT FOREARM  Final   Special Requests BOTTLES DRAWN AEROBIC AND ANAEROBIC 5CC  Final   Culture NO GROWTH 4 DAYS  Final   Report Status PENDING  Incomplete  Culture, blood (routine x 2)     Status: None (Preliminary result)   Collection Time: 01/07/15  2:58 PM  Result Value Ref Range Status   Specimen Description BLOOD RIGHT HAND  Final   Special Requests BOTTLES DRAWN AEROBIC  AND ANAEROBIC 3CC  Final   Culture NO GROWTH 4 DAYS  Final   Report Status PENDING  Incomplete  MRSA PCR Screening     Status: None   Collection Time: 01/08/15 12:20 PM  Result Value Ref Range Status   MRSA by PCR NEGATIVE NEGATIVE Final    Comment:        The GeneXpert MRSA Assay (FDA approved for NASAL specimens only), is one component of a comprehensive MRSA colonization surveillance program. It is not intended to diagnose MRSA infection nor to guide or monitor treatment for MRSA infections.     Medical History: Past Medical History  Diagnosis Date  . Arthritis   . Chicken pox   . Chronic kidney disease   . Colon polyps   . Hypertension   . Dementia   . Angina   . Dysrhythmia   . Heart murmur   . Shortness of breath   . Recurrent upper respiratory infection (URI)   . Pneumonia   . Hypothyroidism   . Anemia   . Headache(784.0)   . Anxiety   . Depression   . Valvular heart disease 09/04/2012  . Left leg pain 10/02/2012  . UTI (urinary tract infection) 12/30/2012  . Acute bronchitis 04/10/2013  . Dehydration 08/31/2013  . Arthritis 10/02/2012    Assessment: 81 y/o F with symptomatic Alzhemier's dementia has aspiration PNA. Patient was made comfort  care but Levaquin has been ordered to treat PNA. Ok's by Dr. Sharl Ma to change to Rocephin/Azithro due to hallucinations, anxiety, depression, insomnia, severe headaches, confusion that can occur in elderly patients on quinolones.     -Patient is afebrile. WBC 192. CrCl 46.   Goal of Therapy:  Eradication of infection  Plan:  Rocephin 1g IV q24h Azithro 500mg  IV q24h   Marilynne Dupuis S. Merilynn Finland, PharmD, BCPS Clinical Staff Pharmacist Pager [phone]  Misty Stanley Stillinger 01/11/2015,5:50 PM

## 2015-01-11 NOTE — Progress Notes (Signed)
As the tech and I were bathing pt after incontinent episode she was able to verbalize that she was "cold and wanted to be covered up". Pt also verbalized that she was "in the hospital" and was able to expectorate sputum after coughing. Pt's grand daughter also verbalized that her grandmother had responded to her during her visit this evening. No signs of discomfort or respiratory distress. Will continue to monitor. Dondra Spry, RN

## 2015-01-11 NOTE — Progress Notes (Signed)
Patient noted to be attempting to get OOB independently. Pt. Reoriented to place and situation. Bed alarm remains activated. Will continue to monitor closely. Dondra Spry, RN

## 2015-01-11 NOTE — Progress Notes (Signed)
Nutrition Brief Note  Pt identified with a low braden score. Chart reviewed. Pt is now comfort care. No nutrition interventions warranted at this time. Please consult as needed.   Roslyn Smiling, MS, RD, LDN Pager # [phone] After hours/ weekend pager # [phone]

## 2015-01-11 NOTE — Progress Notes (Signed)
[facility] PROGRESS NOTE  [name]:[id number] DOB: July 13, 1920 DOA: 01/08/2015 PCP: Judie Bonus, MD  Assessment/Plan: 1. Pneumonia- chest x-ray repeated a day showed improvement in the infiltrate. White count is down to 19,000. Patient is afebrile and almost back to her baseline. We'll start the patient on Levaquin per pharmacy consultation 2. Dementia with behavior disturbance- patient was on multiple and anti-psychotic medications which were making patient very drowsy. After stopping the medications patient is back to her baseline. We'll start clonazepam 0.5 mg twice a day for agitation. Based on the response will consult psych if needed. 3. ? UTI- patient had abnormal UA, urine culture on 01/07/2015 was negative. 4. Hypothyroidism- continue Synthroid 5. Goals of care- discussed with patient's son and daughter at bedside, at this time we'll start the patient on antibiotics and try to adjust her antipsychotic medications. If despite the treatment patient still has behavior disturbance, consider palliative care consultation for possible hospice placement.  Code Status: DNR Family Communication: Discussed with patient's son and daughter at bedside Disposition Plan: Skilled nursing facility versus hospice   Consultants:  None  Procedures:  None  Antibiotics: Rocephin  HPI/Subjective: 81 year old patient with severe Alzheimer's dementia, hypothyroidism and agitation who had been discharged from a psychiatric facility after being there for several weeks for being combative, I spent the last 2 weeks having increased somnolence and confusion. Patient had been discharged from the psychiatric facility on a number of sedating medications including Valium, Lexapro, trazodone, Risperdal and Depakote. With her progressive somnolence and confusion, she was brought into the emergency room on 8/8. Note: She had not shown any signs of aggressive behavior. In the emergency room,  patient found to be hypothermic and have a UTI as well as a large right scalp hematoma from a fall On 01/10/2015, patient was found to have elevated white count 24,000. Also there was a concern for possible aspiration pneumonia. Patient was made comfort care as she was unresponsive, and was started on IV morphine 1 mg per hour. This morning patient was awake, alert, was, communicating clearly. Denies pain or shortness of breath  Objective: Filed Vitals:   01/11/15 1023  BP: 133/75  Pulse: 75  Temp: 98.2 F (36.8 C)  Resp: 15    Intake/Output Summary (Last 24 hours) at 01/11/15 1744 Last data filed at 01/11/15 1024  Gross per 24 hour  Intake   9.37 ml  Output      0 ml  Net   9.37 ml   Filed Weights   01/08/15 1600 01/10/15 2038  Weight: 80.4 kg (177 lb 4 oz) 78.8 kg (173 lb 11.6 oz)    Exam:   General: Appears in no acute distress, mildly anxious  Cardiovascular: S1-S2 regular  Respiratory: Clear to auscultation bilaterally  Abdomen: Soft, nontender, no organomegaly  Musculoskeletal: No edema to lower extremities  Data Reviewed: Basic Metabolic Panel:  Recent Labs Lab 01/07/15 1230 01/08/15 0800 01/09/15 0625  NA 141 140 140  K 5.0 4.9 4.5  CL 102 102 104  CO2 30 28 29   GLUCOSE 108* 140* 122*  BUN 33* 26* 23*  CREATININE 0.85 0.98 0.78  CALCIUM 9.0 9.0 9.0   Liver Function Tests:  Recent Labs Lab 01/07/15 1230 01/08/15 0800  AST 19 20  ALT 16 16  ALKPHOS 78 81  BILITOT 0.5 0.4  PROT 6.2* 6.3*  ALBUMIN 2.9* 2.7*   No results for input(s): LIPASE, AMYLASE in the last 168 hours.  Recent Labs Lab 01/08/15  0800  AMMONIA 23   CBC:  Recent Labs Lab 01/07/15 1230 01/08/15 0800 01/09/15 0625 01/10/15 0517 01/11/15 1303  WBC 12.4* 14.8* 12.9* 24.3* 19.2*  NEUTROABS 9.7* 11.7*  --   --   --   HGB 12.3 12.8 11.9* 12.1 10.3*  HCT 37.3 38.1 36.5 37.1 31.3*  MCV 88.8 89.0 90.1 90.9 90.5  PLT 179 177 163 162 178   Cardiac Enzymes:  Recent  Labs Lab 01/07/15 1458  TROPONINI <0.03   BNP (last 3 results) No results for input(s): BNP in the last 8760 hours.  ProBNP (last 3 results) No results for input(s): PROBNP in the last 8760 hours.  CBG:  Recent Labs Lab 01/08/15 1122 01/09/15 0802  GLUCAP 109* 107*    Recent Results (from the past 240 hour(s))  Urine culture     Status: None   Collection Time: 01/07/15  1:25 PM  Result Value Ref Range Status   Specimen Description URINE, CATHETERIZED  Final   Special Requests NONE  Final   Culture NO GROWTH 1 DAY  Final   Report Status 01/08/2015 FINAL  Final  Culture, blood (routine x 2)     Status: None (Preliminary result)   Collection Time: 01/07/15  2:48 PM  Result Value Ref Range Status   Specimen Description BLOOD RIGHT FOREARM  Final   Special Requests BOTTLES DRAWN AEROBIC AND ANAEROBIC 5CC  Final   Culture NO GROWTH 4 DAYS  Final   Report Status PENDING  Incomplete  Culture, blood (routine x 2)     Status: None (Preliminary result)   Collection Time: 01/07/15  2:58 PM  Result Value Ref Range Status   Specimen Description BLOOD RIGHT HAND  Final   Special Requests BOTTLES DRAWN AEROBIC AND ANAEROBIC 3CC  Final   Culture NO GROWTH 4 DAYS  Final   Report Status PENDING  Incomplete  MRSA PCR Screening     Status: None   Collection Time: 01/08/15 12:20 PM  Result Value Ref Range Status   MRSA by PCR NEGATIVE NEGATIVE Final    Comment:        The GeneXpert MRSA Assay (FDA approved for NASAL specimens only), is one component of a comprehensive MRSA colonization surveillance program. It is not intended to diagnose MRSA infection nor to guide or monitor treatment for MRSA infections.      Studies: Dg Chest Port 1 View  01/10/2015   CLINICAL DATA:  Aspiration pneumonia.  EXAM: PORTABLE CHEST - 1 VIEW  COMPARISON:  January 08, 2015.  FINDINGS: The heart size and mediastinal contours are within normal limits. No pneumothorax is noted. Severe degenerative  change of right glenohumeral joint is noted. Left lung is clear. New right basilar opacity is noted concerning for aspiration pneumonia with possible associated pleural effusion.  IMPRESSION: New right lower lobe opacity is noted consistent with aspiration pneumonia.   Electronically Signed   By: Lupita Raider, M.D.   On: 01/10/2015 12:15   Dg Chest Port 1v Same Day  01/11/2015   CLINICAL DATA:  Patient diagnosed with pneumonia 01/10/2015. Cough and congestion.  EXAM: PORTABLE CHEST - 1 VIEW SAME DAY  COMPARISON:  Single view of the chest 01/10/2015 and 01/08/2015.  FINDINGS: Aeration in the right lung base has markedly improved compared to yesterday's examination. The left lung appears clear. No pneumothorax or pleural effusion is identified. Heart size is normal. No focal bony abnormality seen. Marked right glenohumeral osteoarthritis and postoperative change of rotator cuff repair  noted.  IMPRESSION: Marked improvement in right basilar airspace disease. No new abnormality.   Electronically Signed   By: Drusilla Kanner M.D.   On: 01/11/2015 16:01    Scheduled Meds: . clonazePAM  0.5 mg Oral BID  . scopolamine  1 patch Transdermal Q72H   Continuous Infusions:   Principal Problem:   Aspiration pneumonia Active Problems:   Hypothyroidism   Dementia   FTT (failure to thrive) in adult   Dehydration   Acute encephalopathy   UTI (urinary tract infection)   Dementia in Alzheimer's disease with delirium    Time spent: 25 min    [facility]  [facility] Pager [phone]. If 7PM-7AM, please contact night-coverage at [email or web address], password [facility] 01/11/2015, 5:44 PM  LOS: 3 days

## 2015-01-12 DIAGNOSIS — G9340 Encephalopathy, unspecified: Secondary | ICD-10-CM

## 2015-01-12 LAB — CBC
HEMATOCRIT: 29.9 % — AB (ref 36.0–46.0)
Hemoglobin: 9.7 g/dL — ABNORMAL LOW (ref 12.0–15.0)
MCH: 29 pg (ref 26.0–34.0)
MCHC: 32.4 g/dL (ref 30.0–36.0)
MCV: 89.5 fL (ref 78.0–100.0)
Platelets: 190 10*3/uL (ref 150–400)
RBC: 3.34 MIL/uL — ABNORMAL LOW (ref 3.87–5.11)
RDW: 12.9 % (ref 11.5–15.5)
WBC: 17.2 10*3/uL — AB (ref 4.0–10.5)

## 2015-01-12 LAB — CULTURE, BLOOD (ROUTINE X 2)
Culture: NO GROWTH
Culture: NO GROWTH

## 2015-01-12 NOTE — Progress Notes (Signed)
[facility] PROGRESS NOTE  [name]:[id number] DOB: [birth year] DOA: 01/08/2015 PCP: Judie Bonus, MD  Assessment/Plan: 1. Pneumonia- chest x-ray repeated a day showed improvement in the infiltrate. White count is down to 17,000. Patient is afebrile and almost back to her baseline. Patient started on Ceftriaxone and Zithromax.  2. Dementia with behavior disturbance- patient was on multiple and anti-psychotic medications which were making patient very drowsy. After stopping the medications patient is back to her baseline. Was started clonazepam 0.5 mg twice a day for agitation. . Will get psych consult for meds adjustment. 3. ? UTI- patient had abnormal UA, urine culture on 01/07/2015 was negative. 4. Hypothyroidism- continue Synthroid 5. Goals of care- discussed with patient's son and daughter at bedside, at this time we'll start the patient on antibiotics and try to adjust her antipsychotic medications. Possible discharge to SNF on Monday.  Code Status: DNR Family Communication: Discussed with patient's son and daughter at bedside on 01/11/15 Disposition Plan: Skilled nursing facility   Consultants:  None  Procedures:  None  Antibiotics: Rocephin  HPI/Subjective: 81 year old patient with severe Alzheimer's dementia, hypothyroidism and agitation who had been discharged from a psychiatric facility after being there for several weeks for being combative, I spent the last 2 weeks having increased somnolence and confusion. Patient had been discharged from the psychiatric facility on a number of sedating medications including Valium, Lexapro, trazodone, Risperdal and Depakote. With her progressive somnolence and confusion, she was brought into the emergency room on 8/8. Note: She had not shown any signs of aggressive behavior. In the emergency room, patient found to be hypothermic and have a UTI as well as a large right scalp hematoma from a fall On 01/10/2015, patient was  found to have elevated white count 24,000. Also there was a concern for possible aspiration pneumonia. Patient was made comfort care as she was unresponsive, and was started on IV morphine 1 mg per hour.  Patient seen and examined, denies pain. Has been confused. Started on Clonazepam 0.5 mg po bid yesterday.  Objective: Filed Vitals:   01/12/15 0433  BP: 127/50  Pulse: 52  Temp: 98 F (36.7 C)  Resp: 19    Intake/Output Summary (Last 24 hours) at 01/12/15 1237 Last data filed at 01/12/15 8119  Gross per 24 hour  Intake      0 ml  Output    400 ml  Net   -400 ml   Filed Weights   01/08/15 1600 01/10/15 2038 01/12/15 0433  Weight: 80.4 kg (177 lb 4 oz) 78.8 kg (173 lb 11.6 oz) 82.5 kg (181 lb 14.1 oz)    Exam:   General: Appears in no acute distress, mildly anxious  Cardiovascular: S1-S2 regular  Respiratory: Clear to auscultation bilaterally  Abdomen: Soft, nontender, no organomegaly  Musculoskeletal: No edema to lower extremities  Data Reviewed: Basic Metabolic Panel:  Recent Labs Lab 01/07/15 1230 01/08/15 0800 01/09/15 0625 01/11/15 1303  NA 141 140 140 142  K 5.0 4.9 4.5 4.0  CL 102 102 104 103  CO2 GLUCOSE 108* 140* 122* 96  BUN 33* 26* 23* 41*  CREATININE 0.85 0.98 0.78 1.05*  CALCIUM 9.0 9.0 9.0 9.0   Liver Function Tests:  Recent Labs Lab 01/07/15 1230 01/08/15 0800  AST 19 20  ALT 16 16  ALKPHOS 78 81  BILITOT 0.5 0.4  PROT 6.2* 6.3*  ALBUMIN 2.9* 2.7*   No results for input(s): LIPASE, AMYLASE in the  last 168 hours.  Recent Labs Lab 01/08/15 0800  AMMONIA 23   CBC:  Recent Labs Lab 01/07/15 1230 01/08/15 0800 01/09/15 0625 01/10/15 0517 01/11/15 1303 01/12/15 0400  WBC 12.4* 14.8* 12.9* 24.3* 19.2* 17.2*  NEUTROABS 9.7* 11.7*  --   --   --   --   HGB 12.3 12.8 11.9* 12.1 10.3* 9.7*  HCT 37.3 38.1 36.5 37.1 31.3* 29.9*  MCV 88.8 89.0 90.1 90.9 90.5 89.5  PLT 179 177 163 162 178 190   Cardiac  Enzymes:  Recent Labs Lab 01/07/15 1458  TROPONINI <0.03   BNP (last 3 results) No results for input(s): BNP in the last 8760 hours.  ProBNP (last 3 results) No results for input(s): PROBNP in the last 8760 hours.  CBG:  Recent Labs Lab 01/08/15 1122 01/09/15 0802  GLUCAP 109* 107*    Recent Results (from the past 240 hour(s))  Urine culture     Status: None   Collection Time: 01/07/15  1:25 PM  Result Value Ref Range Status   Specimen Description URINE, CATHETERIZED  Final   Special Requests NONE  Final   Culture NO GROWTH 1 DAY  Final   Report Status 01/08/2015 FINAL  Final  Culture, blood (routine x 2)     Status: None (Preliminary result)   Collection Time: 01/07/15  2:48 PM  Result Value Ref Range Status   Specimen Description BLOOD RIGHT FOREARM  Final   Special Requests BOTTLES DRAWN AEROBIC AND ANAEROBIC 5CC  Final   Culture NO GROWTH 4 DAYS  Final   Report Status PENDING  Incomplete  Culture, blood (routine x 2)     Status: None (Preliminary result)   Collection Time: 01/07/15  2:58 PM  Result Value Ref Range Status   Specimen Description BLOOD RIGHT HAND  Final   Special Requests BOTTLES DRAWN AEROBIC AND ANAEROBIC 3CC  Final   Culture NO GROWTH 4 DAYS  Final   Report Status PENDING  Incomplete  MRSA PCR Screening     Status: None   Collection Time: 01/08/15 12:20 PM  Result Value Ref Range Status   MRSA by PCR NEGATIVE NEGATIVE Final    Comment:        The GeneXpert MRSA Assay (FDA approved for NASAL specimens only), is one component of a comprehensive MRSA colonization surveillance program. It is not intended to diagnose MRSA infection nor to guide or monitor treatment for MRSA infections.      Studies: Dg Chest Port 1v Same Day  01/11/2015   CLINICAL DATA:  Patient diagnosed with pneumonia 01/10/2015. Cough and congestion.  EXAM: PORTABLE CHEST - 1 VIEW SAME DAY  COMPARISON:  Single view of the chest 01/10/2015 and 01/08/2015.  FINDINGS:  Aeration in the right lung base has markedly improved compared to yesterday's examination. The left lung appears clear. No pneumothorax or pleural effusion is identified. Heart size is normal. No focal bony abnormality seen. Marked right glenohumeral osteoarthritis and postoperative change of rotator cuff repair noted.  IMPRESSION: Marked improvement in right basilar airspace disease. No new abnormality.   Electronically Signed   By: Drusilla Kanner M.D.   On: 01/11/2015 16:01    Scheduled Meds: . azithromycin  500 mg Intravenous Q24H  . cefTRIAXone (ROCEPHIN)  IV  1 g Intravenous Q24H  . clonazePAM  0.5 mg Oral BID  . scopolamine  1 patch Transdermal Q72H   Continuous Infusions:   Principal Problem:   Aspiration pneumonia Active Problems:   Hypothyroidism  Dementia   FTT (failure to thrive) in adult   Dehydration   Acute encephalopathy   UTI (urinary tract infection)   Dementia in Alzheimer's disease with delirium    Time spent: 25 min    [facility]  [facility] Pager [phone]. If 7PM-7AM, please contact night-coverage at [email or web address], password [facility] 01/12/2015, 12:37 PM  LOS: 4 days

## 2015-01-12 NOTE — Progress Notes (Signed)
Pt with multiple attempts at getting out of bed. Pt remains pleasantly confused. Bed alarm remains initiated. Request for safety sitter at bedside. Dondra Spry, RN

## 2015-01-12 NOTE — Clinical Social Work Note (Signed)
Clinical Social Worker continuing to follow patient and family for support and discharge planning needs.  Patient family plans for patient to return to [facility] upon discharge.  CSW contacted facility who states that they will have to complete an in person assessment prior to patient return - facility plans to complete assessment on Monday (08/15) between 9am - 12pm.  CSW requested PT/OT evaluations in the event that patient will need SNF placement prior to return.  CSW remains available for support and to facilitate patient discharge needs.  Macario Golds, [place] [phone]

## 2015-01-12 NOTE — Progress Notes (Signed)
Utilization review completed. Marella Vanderpol, RN, BSN. 

## 2015-01-12 NOTE — Evaluation (Signed)
Clinical/Bedside Swallow Evaluation Patient Details  Name: [name] MRN: [id number] Date of Birth: 13-Aug-1920  Today's Date: 01/12/2015 Time: SLP Start Time (ACUTE ONLY): 1026 SLP Stop Time (ACUTE ONLY): 1046 SLP Time Calculation (min) (ACUTE ONLY): 20 min  Past Medical History:  Past Medical History  Diagnosis Date  . Arthritis   . Chicken pox   . Chronic kidney disease   . Colon polyps   . Hypertension   . Dementia   . Angina   . Dysrhythmia   . Heart murmur   . Shortness of breath   . Recurrent upper respiratory infection (URI)   . Pneumonia   . Hypothyroidism   . Anemia   . Headache(784.0)   . Anxiety   . Depression   . Valvular heart disease 09/04/2012  . Left leg pain 10/02/2012  . UTI (urinary tract infection) 12/30/2012  . Acute bronchitis 04/10/2013  . Dehydration 08/31/2013  . Arthritis 10/02/2012   Past Surgical History:  Past Surgical History  Procedure Laterality Date  . Appendectomy    . Cholecystectomy    . Kidney stones    . Tonsillectomy    . Abdominal hysterectomy    . Hand surgery    . Feet surgery    . Shoulder arthroscopy    . Back surgery    . Tonsillectomy    . Eye surgery      cataract removed and eye lids lifted  . Fracture surgery      bilateral arms  . Tubal ligation    . Colonoscopy w/ polypectomy     HPI:  81 year old patient with severe Alzheimer's dementia, hypothyroidism and agitation, admitted with somnolence, aspiration pna.  Pt with improved mentation.  Swallow eval 6/26 with findings of normal function.     Assessment / Plan / Recommendation Clinical Impression  Pt presents with functional swallow with attentiveness to POs, no oral delays or holding, brisk swallow response. No coughing noted on initial boluses of thin liquid; cough occurred at end of session, but difficult to ascertain if it was due to the thin liquids.  Pt with adequate toleration of POs overall.  Recommend initiating a mechanical soft diet with thin liquids;  give meds whole in puree. Swallow function will wax and wane given pt's dx; for now, pt's attentiveness should allow safe eating.  Assist with self-feeding as this was difficult for the pt.  No SLP f/u warranted.      Aspiration Risk  Mild    Diet Recommendation Dysphagia 3 (Mech soft);Thin   Medication Administration: Whole meds with puree    Other  Recommendations Oral Care Recommendations: Oral care BID       Swallow Study Prior Functional Status       General Other Pertinent Information: 81 year old patient with severe Alzheimer's dementia, hypothyroidism and agitation, admitted with somnolence, aspiration pna.  Pt with improved mentation.  Swallow eval 6/26 with findings of normal function.   Type of Study: Bedside swallow evaluation Previous Swallow Assessment: 11/26/14 Diet Prior to this Study: NPO Temperature Spikes Noted: No Respiratory Status: Room air History of Recent Intubation: No Behavior/Cognition: Alert;Cooperative;Confused Oral Cavity - Dentition: Poor condition Self-Feeding Abilities: Able to feed self;Needs assist Patient Positioning: Upright in bed Baseline Vocal Quality: Normal Volitional Cough: Strong Volitional Swallow: Able to elicit    Oral/Motor/Sensory Function Overall Oral Motor/Sensory Function: Appears within functional limits for tasks assessed   Ice Chips Ice chips: Within functional limits   Thin Liquid Thin Liquid: Impaired Presentation: Cup;Straw  Pharyngeal  Phase Impairments: Cough - Delayed Other Comments: occasional cough -difficult to determine if related to swallow or baseline cough    Nectar Thick Nectar Thick Liquid: Not tested   Honey Thick Honey Thick Liquid: Not tested   Puree Puree: Within functional limits   Solid   GO    Solid: Within functional limits       [name] Mounts Laurice 01/12/2015,10:57 AM

## 2015-01-13 MED ORDER — TRAZODONE HCL 50 MG PO TABS
50.0000 mg | ORAL_TABLET | Freq: Every day | ORAL | Status: DC
Start: 2015-01-13 — End: 2015-01-16
  Administered 2015-01-13 – 2015-01-15 (×2): 50 mg via ORAL
  Filled 2015-01-13 (×3): qty 1

## 2015-01-13 MED ORDER — CLONAZEPAM 0.5 MG PO TABS
0.2500 mg | ORAL_TABLET | Freq: Two times a day (BID) | ORAL | Status: DC
  Administered 2015-01-13 – 2015-01-15 (×3): 0.25 mg via ORAL
  Administered 2015-01-15: 25 mg via ORAL
  Filled 2015-01-13 (×6): qty 1

## 2015-01-13 NOTE — Progress Notes (Signed)
[facility] PROGRESS NOTE  [name] ZOX:[id number] DOB: 1920-06-03 DOA: 01/08/2015 PCP: Judie Bonus, MD  Assessment/Plan: 1. Pneumonia- chest x-ray repeated a day showed improvement in the infiltrate. White count is down to 17,000. Patient is afebrile and almost back to her baseline. Patient started on Ceftriaxone and Zithromax. We'll check CBC in a.m. 2. Dementia with behavior disturbance- patient was on multiple and anti-psychotic medications which were making patient very drowsy. After stopping the medications patient is back to her baseline. Was started clonazepam 0.5 mg twice a day for agitation. Psych has seen the patient and decrease the dose of clonazepam to 0.25 mg twice a day. Also started trazodone 50 g at bedtime to help with sleeping at night. 3. ? UTI- patient had abnormal UA, urine culture on 01/07/2015 was negative. 4. Hypothyroidism- continue Synthroid 5. Goals of care- discussed with patient's son and daughter at bedside, at this time we'll start the patient on antibiotics and try to adjust her antipsychotic medications. Possible discharge to SNF on Monday.  Code Status: DNR Family Communication: Discussed with patient's son and daughter at bedside on 01/11/15 Disposition Plan: Skilled nursing facility   Consultants:  None  Procedures:  None  Antibiotics: Rocephin  HPI/Subjective: 81 year old patient with severe Alzheimer's dementia, hypothyroidism and agitation who had been discharged from a psychiatric facility after being there for several weeks for being combative, I spent the last 2 weeks having increased somnolence and confusion. Patient had been discharged from the psychiatric facility on a number of sedating medications including Valium, Lexapro, trazodone, Risperdal and Depakote. With her progressive somnolence and confusion, she was brought into the emergency room on 8/8. Note: She had not shown any signs of aggressive behavior. In the  emergency room, patient found to be hypothermic and have a UTI as well as a large right scalp hematoma from a fall On 01/10/2015, patient was found to have elevated white count 24,000. Also there was a concern for possible aspiration pneumonia. Patient was made comfort care as she was unresponsive, and was started on IV morphine 1 mg per hour.  Patient asleep this morning, did not sleep last night was given clonazepam this morning.  Objective: Filed Vitals:   01/13/15 0902  BP: 116/45  Pulse: 43  Temp: 97.8 F (36.6 C)  Resp: 16    Intake/Output Summary (Last 24 hours) at 01/13/15 1541 Last data filed at 01/12/15 2230  Gross per 24 hour  Intake    760 ml  Output      0 ml  Net    760 ml   Filed Weights   01/08/15 1600 01/10/15 2038 01/12/15 0433  Weight: 80.4 kg (177 lb 4 oz) 78.8 kg (173 lb 11.6 oz) 82.5 kg (181 lb 14.1 oz)    Exam:   General: Sleeping, arousable with verbal stimuli  Cardiovascular: S1-S2 regular  Respiratory: Clear to auscultation bilaterally  Abdomen: Soft, nontender, no organomegaly  Musculoskeletal: No edema to lower extremities  Data Reviewed: Basic Metabolic Panel:  Recent Labs Lab 01/07/15 1230 01/08/15 0800 01/09/15 0625 01/11/15 1303  NA 141 140 140 142  K 5.0 4.9 4.5 4.0  CL 102 102 104 103  CO2 GLUCOSE 108* 140* 122* 96  BUN 33* 26* 23* 41*  CREATININE 0.85 0.98 0.78 1.05*  CALCIUM 9.0 9.0 9.0 9.0   Liver Function Tests:  Recent Labs Lab 01/07/15 1230 01/08/15 0800  AST 19 20  ALT 16 16  ALKPHOS 78 81  BILITOT 0.5 0.4  PROT 6.2* 6.3*  ALBUMIN 2.9* 2.7*   No results for input(s): LIPASE, AMYLASE in the last 168 hours.  Recent Labs Lab 01/08/15 0800  AMMONIA 23   CBC:  Recent Labs Lab 01/07/15 1230 01/08/15 0800 01/09/15 0625 01/10/15 0517 01/11/15 1303 01/12/15 0400  WBC 12.4* 14.8* 12.9* 24.3* 19.2* 17.2*  NEUTROABS 9.7* 11.7*  --   --   --   --   HGB 12.3 12.8 11.9* 12.1 10.3* 9.7*   HCT 37.3 38.1 36.5 37.1 31.3* 29.9*  MCV 88.8 89.0 90.1 90.9 90.5 89.5  PLT 179 177 163 162 178 190   Cardiac Enzymes:  Recent Labs Lab 01/07/15 1458  TROPONINI <0.03   BNP (last 3 results) No results for input(s): BNP in the last 8760 hours.  ProBNP (last 3 results) No results for input(s): PROBNP in the last 8760 hours.  CBG:  Recent Labs Lab 01/08/15 1122 01/09/15 0802  GLUCAP 109* 107*    Recent Results (from the past 240 hour(s))  Urine culture     Status: None   Collection Time: 01/07/15  1:25 PM  Result Value Ref Range Status   Specimen Description URINE, CATHETERIZED  Final   Special Requests NONE  Final   Culture NO GROWTH 1 DAY  Final   Report Status 01/08/2015 FINAL  Final  Culture, blood (routine x 2)     Status: None   Collection Time: 01/07/15  2:48 PM  Result Value Ref Range Status   Specimen Description BLOOD RIGHT FOREARM  Final   Special Requests BOTTLES DRAWN AEROBIC AND ANAEROBIC 5CC  Final   Culture NO GROWTH 5 DAYS  Final   Report Status 01/12/2015 FINAL  Final  Culture, blood (routine x 2)     Status: None   Collection Time: 01/07/15  2:58 PM  Result Value Ref Range Status   Specimen Description BLOOD RIGHT HAND  Final   Special Requests BOTTLES DRAWN AEROBIC AND ANAEROBIC 3CC  Final   Culture NO GROWTH 5 DAYS  Final   Report Status 01/12/2015 FINAL  Final  MRSA PCR Screening     Status: None   Collection Time: 01/08/15 12:20 PM  Result Value Ref Range Status   MRSA by PCR NEGATIVE NEGATIVE Final    Comment:        The GeneXpert MRSA Assay (FDA approved for NASAL specimens only), is one component of a comprehensive MRSA colonization surveillance program. It is not intended to diagnose MRSA infection nor to guide or monitor treatment for MRSA infections.      Studies: No results found.  Scheduled Meds: . azithromycin  500 mg Intravenous Q24H  . cefTRIAXone (ROCEPHIN)  IV  1 g Intravenous Q24H  . clonazePAM  0.25 mg Oral  BID  . scopolamine  1 patch Transdermal Q72H  . traZODone  50 mg Oral QHS   Continuous Infusions:   Principal Problem:   Aspiration pneumonia Active Problems:   Hypothyroidism   Dementia   FTT (failure to thrive) in adult   Dehydration   Acute encephalopathy   UTI (urinary tract infection)   Dementia in Alzheimer's disease with delirium    Time spent: 25 min    [facility]  [facility] Pager [phone]. If 7PM-7AM, please contact night-coverage at [email or web address], password [facility] 01/13/2015, 3:41 PM  LOS: 5 days

## 2015-01-13 NOTE — Progress Notes (Signed)
PT Cancellation Note  Patient Details Name: [name] MRN: [id number] DOB: 10-Jan-1921   Cancelled Treatment:    Reason Eval/Treat Not Completed: Other (comment) (unable to awaken) Pt. Currently with sitter who states pt. Was awake several hours ago, now sleeping very soundly and does not awaken to multiple attempts.  Will follow up tomorrow.   Ferman Hamming 01/13/2015, 2:36 PM Weldon Picking PT Acute Rehab Services [phone] Beeper [phone]

## 2015-01-13 NOTE — Consult Note (Signed)
[facility] Face-to-Face Psychiatry Consult   Reason for Consult: dementia, agitation Referring Physician:  Lama Patient Identification: MADELLINE ESHBACH MRN:  [id number] Principal Diagnosis: Aspiration pneumonia Diagnosis:   Patient Active Problem List   Diagnosis Date Noted  . Aspiration pneumonia [J69.0] 01/10/2015  . Acute encephalopathy [G93.40] 01/08/2015  . UTI (urinary tract infection) [N39.0] 01/08/2015  . Dementia in Alzheimer's disease with delirium [G30.9, F02.80, F05] 01/08/2015  . Recurrent falls [R29.6] 11/27/2014  . FTT (failure to thrive) in adult [R62.7] 10/26/2014  . Dehydration [E86.0] 10/26/2014  . Abnormal urinalysis [R82.90] 10/26/2014  . Normocytic anemia [D64.9] 10/26/2014  . Lump of skin of left upper extremity [R22.32] 09/07/2014  . Rash and nonspecific skin eruption [R21] 05/08/2014  . Syncope and collapse [R55] 11/30/2013  . Low back pain, episodic [M54.5] 11/20/2013  . Chronic kidney disease [N18.9]   . Insomnia [G47.00] 08/31/2013  . Arthritis [M19.90] 10/02/2012  . Valvular heart disease [I38] 09/04/2012  . Restless leg syndrome [G25.81] 06/06/2012  . Trochanteric bursitis [M70.60] 08/02/2011  . Dementia [F03.90] 08/02/2011  . Leg pain [M79.606] 03/28/2011  . Hypertension [I10] 09/22/2010  . Hypothyroidism [E03.9] 09/22/2010    Total Time spent with patient: 20 minutes  Subjective:   [name] is a 81 y.o. female patient admitted with aspiration pneumonia, agitation  HPI: HPI:  81 year old female with severe Alzheimer's dementia (at baseline is very confused, often on able to identify her close family and not able to carry out a regular conversation, anterior resident of memory care unit), hypothyroidism, recurrent falls, who was brought to the ED for increased somnolence and encephalopathy for almost 2 weeks. Patient has worsening dementia and was combative at the facility about 4 weeks back which she required hospitalization to a psych facility in  [place] and stayed there for almost 2 weeks. Patient was discharged from there to the memory care unit about 10 days back and was discharged on multiple sedating medications including Valium, Lexapro, trazodone, Risperdal and Depakote. Family reported that since they have seen her at this facility for the last 10 days patient has been falling asleep during conversations often and progressively been more confused and somnolent. She has not shown any aggressive behavior. She was brought to the ED yesterday as she was found on the floor next to her bed, increasingly somnolent and was found to have UTI. She was also hypothermic. Give the head and cervical spine showed a large right scalp hematoma. Chest x-ray was unremarkable. The ED doctor spoke with the family on the phone and her son did not wish for any aggressive measures and wished to have her sent back to the facility. She was sent back there but patient was found increasingly somnolent.  Blood work repeated shows leukocytosis and mildly elevated BUN. Vitals were stable today (normal temperature) however patient remained somnolent and unarousable to sternal rub only. MRI of the brain was repeated with concern of a possible stroke and was negative. It again showed a large right scalp hematoma. Patient started on IV Rocephin and hospitalist admission requested to medical floor  Patient seen today at 1300 and she is somnolent and difficult to awaken after multiple tries. Sitter states that she has been sleeping for an hour and did not eat lunch. She did not respond to numerous requests to awaken, seems overly sedated. Last clonazepam dose given at 11 am.  HPI Elements:   Location:  global. Quality:  severe. Severity:  severe. Timing:  recent. Duration:  months. Context:  pneumonia, dementia.  Past Medical History:  Past Medical History  Diagnosis Date  . Arthritis   . Chicken pox   . Chronic kidney disease   . Colon polyps   . Hypertension    . Dementia   . Angina   . Dysrhythmia   . Heart murmur   . Shortness of breath   . Recurrent upper respiratory infection (URI)   . Pneumonia   . Hypothyroidism   . Anemia   . Headache(784.0)   . Anxiety   . Depression   . Valvular heart disease 09/04/2012  . Left leg pain 10/02/2012  . UTI (urinary tract infection) 12/30/2012  . Acute bronchitis 04/10/2013  . Dehydration 08/31/2013  . Arthritis 10/02/2012    Past Surgical History  Procedure Laterality Date  . Appendectomy    . Cholecystectomy    . Kidney stones    . Tonsillectomy    . Abdominal hysterectomy    . Hand surgery    . Feet surgery    . Shoulder arthroscopy    . Back surgery    . Tonsillectomy    . Eye surgery      cataract removed and eye lids lifted  . Fracture surgery      bilateral arms  . Tubal ligation    . Colonoscopy w/ polypectomy     Family History:  Family History  Problem Relation Age of Onset  . Heart disease Mother   . Heart disease Father   . Heart disease Other   . Birth defects Other    Social History:  History  Alcohol Use No     History  Drug Use No    Social History   Social History  . Marital Status: Widowed    Spouse Name: N/A  . Number of Children: N/A  . Years of Education: N/A   Social History Main Topics  . Smoking status: Former Smoker -- 0.20 packs/day for 1 years    Types: Cigarettes    Quit date: 06/02/1941  . Smokeless tobacco: Never Used  . Alcohol Use: No  . Drug Use: No  . Sexual Activity: Not Currently    Birth Control/ Protection: Abstinence   Other Topics Concern  . None   Social History Narrative   Lives at [facility] with son and dil   Doesn't use cane or walker, but has cane at home. Remote smoking history.    Additional Social History:                          Allergies:   Allergies  Allergen Reactions  . Morphine And Related     Not noted on MAR  . Codeine Rash  . Hydrocodone Rash    Labs:  Results for orders  placed or performed during the hospital encounter of 01/08/15 (from the past 48 hour(s))  CBC     Status: Abnormal   Collection Time: 01/12/15  4:00 AM  Result Value Ref Range   WBC 17.2 (H) 4.0 - 10.5 K/uL   RBC 3.34 (L) 3.87 - 5.11 MIL/uL   Hemoglobin 9.7 (L) 12.0 - 15.0 g/dL   HCT 16.1 (L) 09.6 - 04.5 %   MCV 89.5 78.0 - 100.0 fL   MCH 29.0 26.0 - 34.0 pg   MCHC 32.4 30.0 - 36.0 g/dL   RDW 40.9 81.1 - 91.4 %   Platelets 190 150 - 400 K/uL    Vitals: Blood pressure  116/45, pulse 43, temperature 97.8 F (36.6 C), temperature source Axillary, resp. rate 16, height 5\' 5"  (1.651 m), weight 82.5 kg (181 lb 14.1 oz), SpO2 95 %.  Risk to Self: Is patient at risk for suicide?: No Risk to Others:   Prior Inpatient Therapy:   Prior Outpatient Therapy:    Current Facility-Administered Medications  Medication Dose Route Frequency Provider Last Rate Last Dose  . azithromycin (ZITHROMAX) 500 mg in dextrose 5 % 250 mL IVPB  500 mg Intravenous Q24H Norva Pavlov, RPH   500 mg at 01/12/15 1819  . cefTRIAXone (ROCEPHIN) 1 g in dextrose 5 % 50 mL IVPB  1 g Intravenous Q24H Norva Pavlov, RPH   1 g at 01/12/15 2018  . clonazePAM (KLONOPIN) tablet 0.5 mg  0.5 mg Oral BID Meredeth Ide, MD   0.5 mg at 01/13/15 1101  . LORazepam (ATIVAN) injection 0.5 mg  0.5 mg Intravenous Q4H PRN Hollice Espy, MD   0.5 mg at 01/13/15 0330  . ondansetron (ZOFRAN) tablet 4 mg  4 mg Oral Q6H PRN Nishant Dhungel, MD       Or  . ondansetron (ZOFRAN) injection 4 mg  4 mg Intravenous Q6H PRN Nishant Dhungel, MD      . scopolamine (TRANSDERM-SCOP) 1 MG/3DAYS 1.5 mg  1 patch Transdermal Q72H Roma Kayser Schorr, NP   1.5 mg at 01/12/15 0525    Musculoskeletal: Strength & Muscle Tone: abnormal Gait & Station: unable to stand Patient leans: N/A  Psychiatric Specialty Exam: Physical Exam  ROS  Blood pressure 116/45, pulse 43, temperature 97.8 F (36.6 C), temperature source Axillary, resp. rate 16, height  5\' 5"  (1.651 m), weight 82.5 kg (181 lb 14.1 oz), SpO2 95 %.Body mass index is 30.27 kg/(m^2).  General Appearance: Disheveled  Eye Solicitor::  None  Speech:  NA  Volume:  n/a  Mood:  NA  Affect:  NA  Thought Process:  NA  Orientation:  NA  Thought Content:  NA  Suicidal Thoughts:  No  Homicidal Thoughts:  No  Memory:  NA  Judgement:  NA  Insight:  NA  Psychomotor Activity:  NA  Concentration:  NA  Recall:  NA  Fund of Knowledge:NA  Language: NA  Akathisia:  NA  Handed:  Right  AIMS (if indicated):     Assets:  Social Support  ADL's:  Impaired  Cognition: Impaired,  Moderate  Sleep:      Medical Decision Making: Review of Psycho-Social Stressors (1), Review or order clinical lab tests (1), Review and summation of old records (2), Established Problem, Worsening (2) and Review of Medication Regimen & Side Effects (2)  Treatment Plan Summary: Daily contact with patient to assess and evaluate symptoms and progress in treatment and Medication management  Plan:  Patient does not meet criteria for psychiatric inpatient admission. Disposition: Patient seems overly sedated, will decrease clonazepam and continue to follow  ROSS, [facility] 01/13/2015 2:03 PM

## 2015-01-14 LAB — BASIC METABOLIC PANEL
ANION GAP: 10 (ref 5–15)
BUN: 20 mg/dL (ref 6–20)
CALCIUM: 8.7 mg/dL — AB (ref 8.9–10.3)
CHLORIDE: 105 mmol/L (ref 101–111)
CO2: 27 mmol/L (ref 22–32)
Creatinine, Ser: 0.86 mg/dL (ref 0.44–1.00)
GFR calc non Af Amer: 57 mL/min — ABNORMAL LOW (ref >60)
Glucose, Bld: 90 mg/dL (ref 65–99)
POTASSIUM: 3.8 mmol/L (ref 3.5–5.1)
SODIUM: 142 mmol/L (ref 135–145)

## 2015-01-14 LAB — CBC
HCT: 31.3 % — ABNORMAL LOW (ref 36.0–46.0)
Hemoglobin: 10.6 g/dL — ABNORMAL LOW (ref 12.0–15.0)
MCH: 30.4 pg (ref 26.0–34.0)
MCHC: 33.9 g/dL (ref 30.0–36.0)
MCV: 89.7 fL (ref 78.0–100.0)
Platelets: 191 10*3/uL (ref 150–400)
RBC: 3.49 MIL/uL — AB (ref 3.87–5.11)
RDW: 13.1 % (ref 11.5–15.5)
WBC: 11.4 10*3/uL — AB (ref 4.0–10.5)

## 2015-01-14 NOTE — Consult Note (Signed)
[facility] Psychiatry Consult   Reason for Consult: dementia, agitation Referring Physician:  Lama Patient Identification: [name] MRN:  [id number] Principal Diagnosis: Aspiration pneumonia Diagnosis:   Patient Active Problem List   Diagnosis Date Noted  . Aspiration pneumonia [J69.0] 01/10/2015  . Acute encephalopathy [G93.40] 01/08/2015  . UTI (urinary tract infection) [N39.0] 01/08/2015  . Dementia in Alzheimer's disease with delirium [G30.9, F02.80, F05] 01/08/2015  . Recurrent falls [R29.6] 11/27/2014  . FTT (failure to thrive) in adult [R62.7] 10/26/2014  . Dehydration [E86.0] 10/26/2014  . Abnormal urinalysis [R82.90] 10/26/2014  . Normocytic anemia [D64.9] 10/26/2014  . Lump of skin of left upper extremity [R22.32] 09/07/2014  . Rash and nonspecific skin eruption [R21] 05/08/2014  . Syncope and collapse [R55] 11/30/2013  . Low back pain, episodic [M54.5] 11/20/2013  . Chronic kidney disease [N18.9]   . Insomnia [G47.00] 08/31/2013  . Arthritis [M19.90] 10/02/2012  . Valvular heart disease [I38] 09/04/2012  . Restless leg syndrome [G25.81] 06/06/2012  . Trochanteric bursitis [M70.60] 08/02/2011  . Dementia [F03.90] 08/02/2011  . Leg pain [M79.606] 03/28/2011  . Hypertension [I10] 09/22/2010  . Hypothyroidism [E03.9] 09/22/2010    Total Time spent with patient: 20 minutes  Subjective:   [name] is a 81 y.o. female patient admitted with aspiration pneumonia, agitation  HPI: HPI:  81 year old female with severe Alzheimer's dementia (at baseline is very confused, often on able to identify her close family and not able to carry out a regular conversation, anterior resident of memory care unit), hypothyroidism, recurrent falls, who was brought to the ED for increased somnolence and encephalopathy for almost 2 weeks. Patient has worsening dementia and was combative at the facility about 4 weeks back which she required hospitalization to a psych facility in  [place] and stayed there for almost 2 weeks. Patient was discharged from there to the memory care unit about 10 days back and was discharged on multiple sedating medications including Valium, Lexapro, trazodone, Risperdal and Depakote. Family reported that since they have seen her at this facility for the last 10 days patient has been falling asleep during conversations often and progressively been more confused and somnolent. She has not shown any aggressive behavior. She was brought to the ED yesterday as she was found on the floor next to her bed, increasingly somnolent and was found to have UTI. She was also hypothermic. Give the head and cervical spine showed a large right scalp hematoma. Chest x-ray was unremarkable. The ED doctor spoke with the family on the phone and her son did not wish for any aggressive measures and wished to have her sent back to the facility. She was sent back there but patient was found increasingly somnolent.  Blood work repeated shows leukocytosis and mildly elevated BUN. Vitals were stable today (normal temperature) however patient remained somnolent and unarousable to sternal rub only. MRI of the brain was repeated with concern of a possible stroke and was negative. It again showed a large right scalp hematoma. Patient started on IV Rocephin and hospitalist admission requested to medical floor  Patient seen today with nurse at bedside. She is much more alert although pleasantly confused. She thinks she is at home and tends to ramble. She slept fairly well through the night and has been less agitated today per nursing report. HPI Elements:   Location:  global. Quality:  severe. Severity:  severe. Timing:  recent. Duration:  months. Context:  pneumonia, dementia.  Past Medical History:  Past Medical History  Diagnosis Date  . Arthritis   . Chicken pox   . Chronic kidney disease   . Colon polyps   . Hypertension   . Dementia   . Angina   . Dysrhythmia    . Heart murmur   . Shortness of breath   . Recurrent upper respiratory infection (URI)   . Pneumonia   . Hypothyroidism   . Anemia   . Headache(784.0)   . Anxiety   . Depression   . Valvular heart disease 09/04/2012  . Left leg pain 10/02/2012  . UTI (urinary tract infection) 12/30/2012  . Acute bronchitis 04/10/2013  . Dehydration 08/31/2013  . Arthritis 10/02/2012    Past Surgical History  Procedure Laterality Date  . Appendectomy    . Cholecystectomy    . Kidney stones    . Tonsillectomy    . Abdominal hysterectomy    . Hand surgery    . Feet surgery    . Shoulder arthroscopy    . Back surgery    . Tonsillectomy    . Eye surgery      cataract removed and eye lids lifted  . Fracture surgery      bilateral arms  . Tubal ligation    . Colonoscopy w/ polypectomy     Family History:  Family History  Problem Relation Age of Onset  . Heart disease Mother   . Heart disease Father   . Heart disease Other   . Birth defects Other    Social History:  History  Alcohol Use No     History  Drug Use No    Social History   Social History  . Marital Status: Widowed    Spouse Name: N/A  . Number of Children: N/A  . Years of Education: N/A   Social History Main Topics  . Smoking status: Former Smoker -- 0.20 packs/day for 1 years    Types: Cigarettes    Quit date: 06/02/1941  . Smokeless tobacco: Never Used  . Alcohol Use: No  . Drug Use: No  . Sexual Activity: Not Currently    Birth Control/ Protection: Abstinence   Other Topics Concern  . None   Social History Narrative   Lives at [facility] with son and dil   Doesn't use cane or walker, but has cane at home. Remote smoking history.    Additional Social History:                          Allergies:   Allergies  Allergen Reactions  . Morphine And Related     Not noted on MAR  . Codeine Rash  . Hydrocodone Rash    Labs:  Results for orders placed or performed during the hospital  encounter of 01/08/15 (from the past 48 hour(s))  CBC     Status: Abnormal   Collection Time: 01/14/15  5:02 AM  Result Value Ref Range   WBC 11.4 (H) 4.0 - 10.5 K/uL   RBC 3.49 (L) 3.87 - 5.11 MIL/uL   Hemoglobin 10.6 (L) 12.0 - 15.0 g/dL   HCT 31.3 (L) 36.0 - 46.0 %   MCV 89.7 78.0 - 100.0 fL   MCH 30.4 26.0 - 34.0 pg   MCHC 33.9 30.0 - 36.0 g/dL   RDW 13.1 11.5 - 15.5 %   Platelets 191 150 - 400 K/uL  Basic metabolic panel     Status: Abnormal   Collection Time: 01/14/15  5:02  AM  Result Value Ref Range   Sodium 142 135 - 145 mmol/L   Potassium 3.8 3.5 - 5.1 mmol/L   Chloride 105 101 - 111 mmol/L   CO2 27 22 - 32 mmol/L   Glucose, Bld 90 65 - 99 mg/dL   BUN 20 6 - 20 mg/dL   Creatinine, Ser 0.86 0.44 - 1.00 mg/dL   Calcium 8.7 (L) 8.9 - 10.3 mg/dL   GFR calc non Af Amer 57 (L) >60 mL/min   GFR calc Af Amer >60 >60 mL/min    Comment: (NOTE) The eGFR has been calculated using the CKD EPI equation. This calculation has not been validated in all clinical situations. eGFR's persistently <60 mL/min signify possible Chronic Kidney Disease.    Anion gap 10 5 - 15    Vitals: Blood pressure 120/51, pulse 61, temperature 98.1 F (36.7 C), temperature source Oral, resp. rate 16, height _0  (1.651 m), weight 82.5 kg (181 lb 14.1 oz), SpO2 100 %.  Risk to Self: Is patient at risk for suicide?: No Risk to Others:   Prior Inpatient Therapy:   Prior Outpatient Therapy:    Current Facility-Administered Medications  Medication Dose Route Frequency Provider Last Rate Last Dose  . azithromycin (ZITHROMAX) 500 mg in dextrose 5 % 250 mL IVPB  500 mg Intravenous Q24H Karren Cobble, RPH   500 mg at 01/13/15 1752  . cefTRIAXone (ROCEPHIN) 1 g in dextrose 5 % 50 mL IVPB  1 g Intravenous Q24H Karren Cobble, RPH   1 g at 01/13/15 1748  . clonazePAM (KLONOPIN) tablet 0.25 mg  0.25 mg Oral BID Cloria Spring, MD   0.25 mg at 01/14/15 1123  . LORazepam (ATIVAN) injection 0.5 mg  0.5  mg Intravenous Q4H PRN Annita Brod, MD   0.5 mg at 01/14/15 1506  . ondansetron (ZOFRAN) tablet 4 mg  4 mg Oral Q6H PRN Nishant Dhungel, MD       Or  . ondansetron (ZOFRAN) injection 4 mg  4 mg Intravenous Q6H PRN Nishant Dhungel, MD      . scopolamine (TRANSDERM-SCOP) 1 MG/3DAYS 1.5 mg  1 patch Transdermal Q72H Rhetta Mura Schorr, NP   1.5 mg at 01/12/15 0525  . traZODone (DESYREL) tablet 50 mg  50 mg Oral QHS Cloria Spring, MD   50 mg at 01/13/15 2302    Musculoskeletal: Strength & Muscle Tone: abnormal Gait & Station: unable to stand Patient leans: N/A  Psychiatric Specialty Exam: Physical Exam  ROS  Blood pressure 120/51, pulse 61, temperature 98.1 F (36.7 C), temperature source Oral, resp. rate 16, height _1  (1.651 m), weight 82.5 kg (181 lb 14.1 oz), SpO2 100 %.Body mass index is 30.27 kg/(m^2).  General Appearance: Disheveled  Eye Contact::  None  Speech: Garbled   Volume: Soft   Mood: Calm   Affect: Blunted   Thought Process: Disorganized   Orientation: To person only   Thought Content: Confused   Suicidal Thoughts:  No  Homicidal Thoughts:  No  Memory: Immediate past and remote all poor   Judgement: Impaired   Insight: Impaired   Psychomotor Activity: Decreased   Concentration: Poor   Recall: Poor   Fund of Knowledge poor   Language: Fair   Akathisia:no  Handed:  Right  AIMS (if indicated):     Assets:  Social Support  ADL's:  Impaired  Cognition: Impaired,  Moderate  Sleep:      Medical Decision Making: Review of  Psycho-Social Stressors (1), Review or order clinical lab tests (1), Review and summation of old records (2), Established Problem, Worsening (2) and Review of Medication Regimen & Side Effects (2)  Treatment Plan Summary: Daily contact with patient to assess and evaluate symptoms and progress in treatment and Medication management  Plan:  Patient does not meet criteria for psychiatric inpatient admission. Disposition: Patient is doing  better, less agitated and more alert. Continue combination of low-dose clonazepam and trazodone at bedtime. We'll sign off, please call if further help is needed  Harrington Challenger [facility] 01/14/2015 3:21 PM

## 2015-01-14 NOTE — Progress Notes (Signed)
[facility] PROGRESS NOTE  ALISSANDRA GEOFFROY ZOX:[id number] DOB: 10/12/1920 DOA: 01/08/2015 PCP: Judie Bonus, MD  Assessment/Plan: 1. Pneumonia- chest x-ray repeated  showed improvement in the infiltrate. White count is down to 11.4. Patient is afebrile and almost back to her baseline. Patient was started on Ceftriaxone and Zithromax, will continue for one more day to complete seven days of therapy 2. Dementia with behavior disturbance- patient was on multiple and anti-psychotic medications which were making patient very drowsy. After stopping the medications patient is back to her baseline. Was started clonazepam 0.5 mg twice a day for agitation. Psych has seen the patient and decrease the dose of clonazepam to 0.25 mg twice a day. Also started trazodone 50 g at bedtime to help with sleeping at night. 3. ? UTI- patient had abnormal UA, urine culture on 01/07/2015 was negative. 4. Hypothyroidism- continue Synthroid 5. Goals of care- discussed with patient's son and daughter at bedside, at this time we'll start the patient on antibiotics and try to adjust her antipsychotic medications. Possible discharge to SNF on Monday.  Code Status: DNR Family Communication: Discussed with patient's son and daughter at bedside on 01/11/15 Disposition Plan: Skilled nursing facility   Consultants:  None  Procedures:  None  Antibiotics: Rocephin  HPI/Subjective: 81 year old patient with severe Alzheimer's dementia, hypothyroidism and agitation who had been discharged from a psychiatric facility after being there for several weeks for being combative, I spent the last 2 weeks having increased somnolence and confusion. Patient had been discharged from the psychiatric facility on a number of sedating medications including Valium, Lexapro, trazodone, Risperdal and Depakote. With her progressive somnolence and confusion, she was brought into the emergency room on 8/8. Note: She had not shown any signs  of aggressive behavior. In the emergency room, patient found to be hypothermic and have a UTI as well as a large right scalp hematoma from a fall On 01/10/2015, patient was found to have elevated white count 24,000. Also there was a concern for possible aspiration pneumonia. Patient was made comfort care as she was unresponsive, and was started on IV morphine 1 mg per hour.  Patient slept well last night, has been pleasantly confused this morning.  Objective: Filed Vitals:   01/14/15 0916  BP: 120/51  Pulse: 61  Temp: 98.1 F (36.7 C)  Resp: 16    Intake/Output Summary (Last 24 hours) at 01/14/15 1344 Last data filed at 01/14/15 0956  Gross per 24 hour  Intake    240 ml  Output    425 ml  Net   -185 ml   Filed Weights   01/08/15 1600 01/10/15 2038 01/12/15 0433  Weight: 80.4 kg (177 lb 4 oz) 78.8 kg (173 lb 11.6 oz) 82.5 kg (181 lb 14.1 oz)    Exam:   General: Pleasantly confused  Cardiovascular: S1-S2 regular  Respiratory: Clear to auscultation bilaterally  Abdomen: Soft, nontender, no organomegaly  Musculoskeletal: No edema to lower extremities  Data Reviewed: Basic Metabolic Panel:  Recent Labs Lab 01/08/15 0800 01/09/15 0625 01/11/15 1303 01/14/15 0502  NA 140 140 142 142  K 4.9 4.5 4.0 3.8  CL 102 104 103 105  CO2 28 29 28 27   GLUCOSE 140* 122* 96 90  BUN 26* 23* 41* 20  CREATININE 0.98 0.78 1.05* 0.86  CALCIUM 9.0 9.0 9.0 8.7*   Liver Function Tests:  Recent Labs Lab 01/08/15 0800  AST 20  ALT 16  ALKPHOS 81  BILITOT 0.4  PROT 6.3*  ALBUMIN 2.7*   No results for input(s): LIPASE, AMYLASE in the last 168 hours.  Recent Labs Lab 01/08/15 0800  AMMONIA 23   CBC:  Recent Labs Lab 01/08/15 0800 01/09/15 0625 01/10/15 0517 01/11/15 1303 01/12/15 0400 01/14/15 0502  WBC 14.8* 12.9* 24.3* 19.2* 17.2* 11.4*  NEUTROABS 11.7*  --   --   --   --   --   HGB 12.8 11.9* 12.1 10.3* 9.7* 10.6*  HCT 38.1 36.5 37.1 31.3* 29.9* 31.3*  MCV  89.0 90.1 90.9 90.5 89.5 89.7  PLT 177 163 162 178 190 191   Cardiac Enzymes:  Recent Labs Lab 01/07/15 1458  TROPONINI <0.03   BNP (last 3 results) No results for input(s): BNP in the last 8760 hours.  ProBNP (last 3 results) No results for input(s): PROBNP in the last 8760 hours.  CBG:  Recent Labs Lab 01/08/15 1122 01/09/15 0802  GLUCAP 109* 107*    Recent Results (from the past 240 hour(s))  Urine culture     Status: None   Collection Time: 01/07/15  1:25 PM  Result Value Ref Range Status   Specimen Description URINE, CATHETERIZED  Final   Special Requests NONE  Final   Culture NO GROWTH 1 DAY  Final   Report Status 01/08/2015 FINAL  Final  Culture, blood (routine x 2)     Status: None   Collection Time: 01/07/15  2:48 PM  Result Value Ref Range Status   Specimen Description BLOOD RIGHT FOREARM  Final   Special Requests BOTTLES DRAWN AEROBIC AND ANAEROBIC 5CC  Final   Culture NO GROWTH 5 DAYS  Final   Report Status 01/12/2015 FINAL  Final  Culture, blood (routine x 2)     Status: None   Collection Time: 01/07/15  2:58 PM  Result Value Ref Range Status   Specimen Description BLOOD RIGHT HAND  Final   Special Requests BOTTLES DRAWN AEROBIC AND ANAEROBIC 3CC  Final   Culture NO GROWTH 5 DAYS  Final   Report Status 01/12/2015 FINAL  Final  MRSA PCR Screening     Status: None   Collection Time: 01/08/15 12:20 PM  Result Value Ref Range Status   MRSA by PCR NEGATIVE NEGATIVE Final    Comment:        The GeneXpert MRSA Assay (FDA approved for NASAL specimens only), is one component of a comprehensive MRSA colonization surveillance program. It is not intended to diagnose MRSA infection nor to guide or monitor treatment for MRSA infections.      Studies: No results found.  Scheduled Meds: . azithromycin  500 mg Intravenous Q24H  . cefTRIAXone (ROCEPHIN)  IV  1 g Intravenous Q24H  . clonazePAM  0.25 mg Oral BID  . scopolamine  1 patch Transdermal Q72H   . traZODone  50 mg Oral QHS   Continuous Infusions:   Principal Problem:   Aspiration pneumonia Active Problems:   Hypothyroidism   Dementia   FTT (failure to thrive) in adult   Dehydration   Acute encephalopathy   UTI (urinary tract infection)   Dementia in Alzheimer's disease with delirium    Time spent: 25 min    [facility]  [facility] Pager [phone]. If 7PM-7AM, please contact night-coverage at [email or web address], password [facility] 01/14/2015, 1:44 PM  LOS: 6 days

## 2015-01-14 NOTE — Evaluation (Signed)
Physical Therapy Evaluation Patient Details Name: [name] MRN: [id number] DOB: [birth year] Today's Date: 01/14/2015   History of Present Illness  81 year old patient with severe Alzheimer's dementia, hypothyroidism and agitation, admitted with somnolence, aspiration pna  Clinical Impression  Pt admitted with above diagnosis. Pt currently with functional limitations due to the deficits listed below (see PT Problem List).  Pt will benefit from skilled PT to increase their independence and safety with mobility to allow discharge to the venue listed below.       Follow Up Recommendations SNF    Equipment Recommendations  None recommended by PT    Recommendations for Other Services Other (comment) (Will consider Vestibular specialist)     Precautions / Restrictions Restrictions Weight Bearing Restrictions: No      Mobility  Bed Mobility Overal bed mobility: Needs Assistance Bed Mobility: Supine to Sit;Sit to Supine     Supine to sit: +2 for safety/equipment;Mod assist Sit to supine: +2 for safety/equipment;Mod assist   General bed mobility comments: Heavy mod assist to elevate trunk to sit; physical cueing to initiate; difficulty following commands; easily distractable and confused  Transfers Overall transfer level: Needs assistance Equipment used: 2 person hand held assist Transfers: Sit to/from Stand Sit to Stand: Mod assist;+2 safety/equipment         General transfer comment: Tending to keep weight posteriorly; heavy mod assist to power-up and to translate center of mass anteriorly over feet  Ambulation/Gait             General Gait Details: Unable to test today; hope to have +2 assist next session to be able to assess gait, as it seems she was able to walk with assistive device within the past 2 months  Stairs            Wheelchair Mobility    Modified Rankin (Stroke Patients Only)       Balance Overall balance assessment: Needs assistance     Sitting balance - Comments: Pt did state she felt "drunk" initially sitting up; no nystagmus noted, and symptoms subsided quickly; Noted tendency to left lean, but it seems that is because [name] was trying to lay back down     Standing balance-Leahy Scale: Zero                               Pertinent Vitals/Pain Pain Assessment: No/denies pain    Home Living Family/patient expects to be discharged to:: Skilled nursing facility                      Prior Function Level of Independence: Needs assistance   Gait / Transfers Assistance Needed: S with AD due to h/o falls  ADL's / Homemaking Assistance Needed: S due to h/o falls  Comments: As of last month's PT eval: uses cane in her apartment, but has had falls     Hand Dominance   Dominant Hand: Right    Extremity/Trunk Assessment   Upper Extremity Assessment: Generalized weakness           Lower Extremity Assessment: Difficult to assess due to impaired cognition;Generalized weakness         Communication   Communication: No difficulties;Other (comment) (confused)  Cognition Arousal/Alertness: Awake/alert Behavior During Therapy: WFL for tasks assessed/performed Overall Cognitive Status: No family/caregiver present to determine baseline cognitive functioning       Memory: Decreased short-term memory (noted dementia in history)  General Comments General comments (skin integrity, edema, etc.): evidence of bruising R side of face, balck eye-like    Exercises        Assessment/Plan    PT Assessment Patient needs continued PT services  PT Diagnosis Difficulty walking;Generalized weakness   PT Problem List Decreased strength;Decreased activity tolerance;Decreased balance;Decreased mobility;Decreased coordination;Decreased cognition;Decreased knowledge of use of DME;Decreased safety awareness;Decreased knowledge of precautions  PT Treatment Interventions DME  instruction;Gait training;Functional mobility training;Therapeutic activities;Therapeutic exercise;Balance training;Neuromuscular re-education;Cognitive remediation;Patient/family education;Other (comment) (possible vestibular interventions)   PT Goals (Current goals can be found in the Care Plan section) Acute Rehab PT Goals Patient Stated Goal: did not state; perseverative on finding her purse PT Goal Formulation: Patient unable to participate in goal setting Time For Goal Achievement: 01/28/15 Potential to Achieve Goals: Good    Frequency Min 2X/week   Barriers to discharge        Co-evaluation               End of Session Equipment Utilized During Treatment: Gait belt Activity Tolerance: Other (comment) (limited by pt's desire to lay back down) Patient left: in bed;with nursing/sitter in room;with call bell/phone within reach Nurse Communication: Mobility status         Time: 1610-9604 PT Time Calculation (min) (ACUTE ONLY): 10 min   Charges:   PT Evaluation $Initial PT Evaluation Tier I: 1 Procedure     PT G CodesOlen Pel 01/14/2015, 11:31 AM  Van Clines, PT  Acute Rehabilitation Services Pager [phone] Office [phone]

## 2015-01-15 DIAGNOSIS — N390 Urinary tract infection, site not specified: Secondary | ICD-10-CM

## 2015-01-15 MED ORDER — AZITHROMYCIN 500 MG IV SOLR
500.0000 mg | INTRAVENOUS | Status: AC
  Administered 2015-01-15: 500 mg via INTRAVENOUS
  Filled 2015-01-15: qty 500

## 2015-01-15 MED ORDER — DEXTROSE 5 % IV SOLN
1.0000 g | INTRAVENOUS | Status: AC
  Administered 2015-01-15: 1 g via INTRAVENOUS
  Filled 2015-01-15: qty 10

## 2015-01-15 NOTE — Care Management Important Message (Signed)
Important Message  Patient Details  Name: [name] MRN: [id number] Date of Birth: 10-24-1920   Medicare Important Message Given:  Yes-third notification given    Orson Aloe 01/15/2015, 3:45 PM

## 2015-01-15 NOTE — Progress Notes (Signed)
Occupational Therapy Evaluation Patient Details Name: [name] MRN: [id number] DOB: 09-Apr-1921 Today's Date: 01/15/2015    History of Present Illness 81 year old patient with severe Alzheimer's dementia, hypothyroidism and agitation, admitted with somnolence, aspiration pna   Clinical Impression   Patient presents to OT with decreased ADL independence and safety and unclear PLOF. OT will follow to maximize function and to facilitate a safe discharge plan.    Follow Up Recommendations  SNF;Supervision/Assistance - 24 hour    Equipment Recommendations  Other (comment) (to be determined at next venue of care)    Recommendations for Other Services       Precautions / Restrictions Precautions Precautions: Fall Restrictions Weight Bearing Restrictions: No      Mobility Bed Mobility                  Transfers                      Balance                                            ADL Overall ADL's : Needs assistance/impaired     Grooming: Wash/dry hands;Wash/dry face;Oral care;Minimal assistance;Cueing for sequencing;Bed level                                 General ADL Comments: Patient received in bed; no visitors present. Confused about where she was and the situation. Required frequent reorientation and cues for sequencing for grooming tasks. Patient declined EOB/OOB but needed +2 assistance with PT. Chart indicates she lived on a memory care unit due to severe Alzheimer's disease. Unclear PLOF and patient unable to state.     Vision     Perception     Praxis      Pertinent Vitals/Pain Pain Assessment: No/denies pain     Hand Dominance Right   Extremity/Trunk Assessment Upper Extremity Assessment Upper Extremity Assessment: Overall WFL for tasks assessed;Generalized weakness   Lower Extremity Assessment Lower Extremity Assessment: Defer to PT evaluation       Communication  Communication Communication: No difficulties;Other (comment)   Cognition Arousal/Alertness: Awake/alert Behavior During Therapy: WFL for tasks assessed/performed Overall Cognitive Status: History of cognitive impairments - at baseline       Memory: Decreased short-term memory             General Comments       Exercises       Shoulder Instructions      Home Living Family/patient expects to be discharged to:: Skilled nursing facility                                        Prior Functioning/Environment Level of Independence: Needs assistance  Gait / Transfers Assistance Needed: S with AD due to h/o falls ADL's / Homemaking Assistance Needed: S due to h/o falls        OT Diagnosis: Generalized weakness;Cognitive deficits   OT Problem List: Decreased strength;Decreased range of motion;Decreased activity tolerance;Impaired balance (sitting and/or standing);Decreased cognition;Decreased safety awareness   OT Treatment/Interventions: Self-care/ADL training;Therapeutic exercise;DME and/or AE instruction;Therapeutic activities;Patient/family education    OT Goals(Current goals can be found in the care plan section) Acute Rehab OT  Goals Patient Stated Goal: none stated OT Goal Formulation: Patient unable to participate in goal setting Time For Goal Achievement: 01/29/15 Potential to Achieve Goals: Fair  OT Frequency: Min 2X/week   Barriers to D/C:            Co-evaluation              End of Session    Activity Tolerance: Other (comment) (patient refusing EOB/OOB) Patient left: in bed;with call bell/phone within reach;with bed alarm set   Time: 1005-1026 OT Time Calculation (min): 21 min Charges:  OT General Charges $OT Visit: 1 Procedure OT Evaluation $Initial OT Evaluation Tier I: 1 Procedure G-Codes:    Erielle Gawronski A 2015-01-19, 12:28 PM

## 2015-01-15 NOTE — Progress Notes (Signed)
[facility] PROGRESS NOTE  [name] YQM:[id number] DOB: 1921/05/14 DOA: 01/08/2015 PCP: Judie Bonus, MD  Assessment/Plan: 1. Pneumonia- chest x-ray repeated  showed improvement in the infiltrate. White count is down to 11.4. Patient is afebrile and almost back to her baseline. Patient was started on Ceftriaxone and Zithromax, will continue for one more day to complete seven days of therapy. Will discontinue antibiotic at this time. 2. Dementia with behavior disturbance- patient was on multiple and anti-psychotic medications which were making patient very drowsy. After stopping the medications patient is back to her baseline. Was started clonazepam 0.5 mg twice a day for agitation. Psych has seen the patient and decrease the dose of clonazepam to 0.25 mg twice a day. Also started trazodone 50 g at bedtime to help with sleeping at night. 3. ? UTI- patient had abnormal UA, urine culture on 01/07/2015 was negative. 4. Hypothyroidism- continue Synthroid 5. Goals of care- discussed with patient's son and daughter at bedside, at this time we'll start the patient on antibiotics and try to adjust her antipsychotic medications. Discharge to skilled nursing facility in next 24-48 hours.  Code Status: DNR Family Communication: Discussed with patient's son and daughter at bedside on 01/11/15 Disposition Plan: Skilled nursing facility   Consultants:  None  Procedures:  None  Antibiotics: Rocephin  HPI/Subjective: 81 year old patient with severe Alzheimer's dementia, hypothyroidism and agitation who had been discharged from a psychiatric facility after being there for several weeks for being combative, I spent the last 2 weeks having increased somnolence and confusion. Patient had been discharged from the psychiatric facility on a number of sedating medications including Valium, Lexapro, trazodone, Risperdal and Depakote. With her progressive somnolence and confusion, she was brought  into the emergency room on 8/8. Note: She had not shown any signs of aggressive behavior. In the emergency room, patient found to be hypothermic and have a UTI as well as a large right scalp hematoma from a fall On 01/10/2015, patient was found to have elevated white count 24,000. Also there was a concern for possible aspiration pneumonia. Patient was made comfort care as she was unresponsive, and was started on IV morphine 1 mg per hour.  Patient seen and examined, no agitation; Pleasantly confused.  Objective: Filed Vitals:   01/15/15 0517  BP: 124/46  Pulse: 53  Temp: 98.2 F (36.8 C)  Resp:     Intake/Output Summary (Last 24 hours) at 01/15/15 1449 Last data filed at 01/15/15 1006  Gross per 24 hour  Intake    360 ml  Output    350 ml  Net     10 ml   Filed Weights   01/10/15 2038 01/12/15 0433 01/14/15 2028  Weight: 78.8 kg (173 lb 11.6 oz) 82.5 kg (181 lb 14.1 oz) 82.64 kg (182 lb 3 oz)    Exam:   General: Pleasantly confused  Cardiovascular: S1-S2 regular  Respiratory: Clear to auscultation bilaterally  Abdomen: Soft, nontender, no organomegaly  Musculoskeletal: No edema to lower extremities  Data Reviewed: Basic Metabolic Panel:  Recent Labs Lab 01/09/15 0625 01/11/15 1303 01/14/15 0502  NA 140 142 142  K 4.5 4.0 3.8  CL 104 103 105  CO2 GLUCOSE 122* 96 90  BUN 23* 41* 20  CREATININE 0.78 1.05* 0.86  CALCIUM 9.0 9.0 8.7*   Liver Function Tests: No results for input(s): AST, ALT, ALKPHOS, BILITOT, PROT, ALBUMIN in the last 168 hours. No results for input(s): LIPASE, AMYLASE in the last  168 hours. No results for input(s): AMMONIA in the last 168 hours. CBC:  Recent Labs Lab 01/09/15 0625 01/10/15 0517 01/11/15 1303 01/12/15 0400 01/14/15 0502  WBC 12.9* 24.3* 19.2* 17.2* 11.4*  HGB 11.9* 12.1 10.3* 9.7* 10.6*  HCT 36.5 37.1 31.3* 29.9* 31.3*  MCV 90.1 90.9 90.5 89.5 89.7  PLT 163 162 178 190 191   Cardiac Enzymes: No results  for input(s): CKTOTAL, CKMB, CKMBINDEX, TROPONINI in the last 168 hours. BNP (last 3 results) No results for input(s): BNP in the last 8760 hours.  ProBNP (last 3 results) No results for input(s): PROBNP in the last 8760 hours.  CBG:  Recent Labs Lab 01/09/15 0802  GLUCAP 107*    Recent Results (from the past 240 hour(s))  Urine culture     Status: None   Collection Time: 01/07/15  1:25 PM  Result Value Ref Range Status   Specimen Description URINE, CATHETERIZED  Final   Special Requests NONE  Final   Culture NO GROWTH 1 DAY  Final   Report Status 01/08/2015 FINAL  Final  Culture, blood (routine x 2)     Status: None   Collection Time: 01/07/15  2:48 PM  Result Value Ref Range Status   Specimen Description BLOOD RIGHT FOREARM  Final   Special Requests BOTTLES DRAWN AEROBIC AND ANAEROBIC 5CC  Final   Culture NO GROWTH 5 DAYS  Final   Report Status 01/12/2015 FINAL  Final  Culture, blood (routine x 2)     Status: None   Collection Time: 01/07/15  2:58 PM  Result Value Ref Range Status   Specimen Description BLOOD RIGHT HAND  Final   Special Requests BOTTLES DRAWN AEROBIC AND ANAEROBIC 3CC  Final   Culture NO GROWTH 5 DAYS  Final   Report Status 01/12/2015 FINAL  Final  MRSA PCR Screening     Status: None   Collection Time: 01/08/15 12:20 PM  Result Value Ref Range Status   MRSA by PCR NEGATIVE NEGATIVE Final    Comment:        The GeneXpert MRSA Assay (FDA approved for NASAL specimens only), is one component of a comprehensive MRSA colonization surveillance program. It is not intended to diagnose MRSA infection nor to guide or monitor treatment for MRSA infections.      Studies: No results found.  Scheduled Meds: . clonazePAM  0.25 mg Oral BID  . scopolamine  1 patch Transdermal Q72H  . traZODone  50 mg Oral QHS   Continuous Infusions:   Principal Problem:   Aspiration pneumonia Active Problems:   Hypothyroidism   Dementia   FTT (failure to thrive)  in adult   Dehydration   Acute encephalopathy   UTI (urinary tract infection)   Dementia in Alzheimer's disease with delirium    Time spent: 25 min    [facility]  [facility] Pager [phone]. If 7PM-7AM, please contact night-coverage at [email or web address], password [facility] 01/15/2015, 2:49 PM  LOS: 7 days

## 2015-01-15 NOTE — Clinical Social Work Note (Signed)
Clydie Braun, Memory Care Manager with [facility] came (after 3:30 pm) to evaluate to assure [name] is appropriate to return to their facility. According to Clydie Braun, patient is appropriate and can return to ALF on Tuesday. Transmit [phone]) discharge paperwork to Brooke's attention at [facility] on Tuesday. MD notified of Karen's visit.  Genelle Bal, MSW, LCSW Licensed Clinical Social Worker Clinical Social Work Department [employer] [phone]

## 2015-01-16 MED ORDER — CLONAZEPAM 0.5 MG PO TABS: 0.2500 mg | ORAL_TABLET | Freq: Two times a day (BID) | ORAL | Status: DC

## 2015-01-16 MED ORDER — TRAZODONE HCL 50 MG PO TABS: 50.0000 mg | ORAL_TABLET | Freq: Every day | ORAL | Status: DC

## 2015-01-16 NOTE — Clinical Social Work Note (Addendum)
Patient discharging back to [facility] today. Discharge information transmitted to facility and patient will be transported by ambulance. Son, [name] contacted and message left regarding discharge.  Genelle Bal, MSW, LCSW Licensed Clinical Social Worker Clinical Social Work Department [employer] [phone]

## 2015-01-16 NOTE — Progress Notes (Signed)
[name] to be D/C'd Skilled nursing facility per MD order.  Discussed prescriptions and follow up appointments with the patient. Prescriptions given to patient, medication list explained in detail. Pt verbalized understanding.    Medication List    STOP taking these medications        cephALEXin 500 MG capsule  Commonly known as:  KEFLEX     ciprofloxacin 500 MG tablet  Commonly known as:  CIPRO     diazepam 5 MG tablet  Commonly known as:  VALIUM     divalproex 500 MG 24 hr tablet  Commonly known as:  DEPAKOTE ER     escitalopram 10 MG tablet  Commonly known as:  LEXAPRO     lactulose 10 GM/15ML solution  Commonly known as:  CHRONULAC     LORazepam 1 MG tablet  Commonly known as:  ATIVAN     losartan 50 MG tablet  Commonly known as:  COZAAR     Magnesium 250 MG Tabs     risperiDONE 1 MG tablet  Commonly known as:  RISPERDAL      TAKE these medications        albuterol 108 (90 BASE) MCG/ACT inhaler  Commonly known as:  PROVENTIL HFA;VENTOLIN HFA  Inhale 2 puffs into the lungs every 6 (six) hours as needed for wheezing or shortness of breath.     aspirin 81 MG tablet  Take 81 mg by mouth daily.     clonazePAM 0.5 MG tablet  Commonly known as:  KLONOPIN  Take 0.5 tablets (0.25 mg total) by mouth 2 (two) times daily.     levothyroxine 50 MCG tablet  Commonly known as:  SYNTHROID, LEVOTHROID  TAKE 1 TABLET (50 MCG TOTAL) BY MOUTH DAILY.     traZODone 50 MG tablet  Commonly known as:  DESYREL  Take 1 tablet (50 mg total) by mouth at bedtime.        Filed Vitals:   01/16/15 0852  BP: 130/47  Pulse: 66  Temp: 97.5 F (36.4 C)  Resp: 18    Skin clean, dry and intact. Patient has bruising and swelling on her right eyebrow. Patients bottom is red, blanchable and barrier ointment was applied to site. Heels were also red. IV catheter discontinued intact. Site without signs and symptoms of complications. Dressing and pressure applied. Pt denies pain at  this time. No complaints noted.  Disharge packet prepared by Child psychotherapist. Transportation arranged with [facility].  Janeann Forehand BSN, RN

## 2015-01-16 NOTE — Discharge Summary (Addendum)
Physician Discharge Summary  [name]:[id number] DOB: January 27, 1921 DOA: 01/08/2015  PCP: Judie Bonus, MD  Admit date: 01/08/2015 Discharge date: 01/16/2015  Time spent: 25 minutes  Recommendations for Outpatient Follow-up:  1. 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 Patient had been discharged from the psychiatric facility on a number of sedating medications including Valium, Lexapro, trazodone, Risperdal and Depakote. With her progressive somnolence and confusion, she was brought into the emergency room on 8/8. Note: She had not shown any signs of aggressive behavior. In the emergency room, patient found to be hypothermic and have a UTI as well as a large right scalp hematoma from a fall.   Hospital Course:  1. Pneumonia- chest x-ray repeated showed improvement in the infiltrate. White count is down to 11.4. Patient is afebrile and almost back to her baseline. Patient was started on Ceftriaxone and Zithromax, completed seven days of antibiotics in the hospital. 2. Dementia with behavior disturbance- patient was on multiple and anti-psychotic  medications which were making patient very drowsy. After stopping the medications patient is back to her baseline. Was started clonazepam 0.5 mg twice a day for agitation. Psych has seen the patient and decrease the dose of clonazepam to 0.25 mg twice a day. Also started trazodone 50 g at bedtime to help with sleeping at night. At this time, will continue on this regimen. 3. ? UTI- patient had abnormal UA, urine culture on 01/07/2015 was negative. 4. Hypothyroidism- continue Synthroid 5. Hypertension- Cozaar has been discontinued, her BP is stable. Keep a close watch on the BP over next few weeks.  Procedures:  None  Consultations:  Psychiatry   Discharge Exam: Filed Vitals:   01/16/15 0852  BP: 130/47  Pulse: 66  Temp: 97.5 F (36.4 C)  Resp: 18    General: Appear in no acute distress Cardiovascular: S1 S2 RRR Respiratory: Clear bilaterally  Discharge Instructions   Discharge Instructions    Diet - low sodium heart healthy    Complete by:  As directed      Increase activity slowly    Complete by:  As directed           Current Discharge Medication List    START taking these medications   Details  clonazePAM (KLONOPIN) 0.5 MG tablet Take 0.5 tablets (0.25 mg total) by mouth 2 (two) times daily. Qty: 30 tablet, Refills: 0      CONTINUE these medications which have CHANGED   Details  traZODone (DESYREL) 50 MG tablet Take 1 tablet (50 mg total) by mouth at bedtime. Qty: 30 tablet, Refills: 0      CONTINUE these  medications which have NOT CHANGED   Details  albuterol (PROVENTIL HFA;VENTOLIN HFA) 108 (90 BASE) MCG/ACT inhaler Inhale 2 puffs into the lungs every 6 (six) hours as needed for wheezing or shortness of breath. Qty: 1 Inhaler, Refills: 0    aspirin 81 MG tablet Take 81 mg by mouth daily.    levothyroxine (SYNTHROID, LEVOTHROID) 50 MCG tablet TAKE 1 TABLET (50 MCG TOTAL) BY MOUTH DAILY. Qty: 30 tablet, Refills: 3      STOP taking these medications      diazepam (VALIUM) 5 MG tablet      divalproex (DEPAKOTE ER) 500 MG 24 hr tablet      escitalopram (LEXAPRO) 10 MG tablet      lactulose (CHRONULAC) 10 GM/15ML solution      losartan (COZAAR) 50 MG tablet      Magnesium 250 MG TABS      risperiDONE (RISPERDAL) 1 MG tablet      cephALEXin (KEFLEX) 500 MG capsule      ciprofloxacin (CIPRO) 500 MG tablet      LORazepam (ATIVAN) 1 MG tablet        Allergies  Allergen Reactions  . Morphine And Related     Not noted on MAR  . Codeine Rash  . Hydrocodone Rash      The results of significant diagnostics from this hospitalization (including imaging, microbiology, ancillary and laboratory) are listed below for reference.    Significant Diagnostic Studies: Dg Chest 1 View  01/07/2015   CLINICAL DATA:  Found down. Periorbital hematoma on the right with knee abrasions. History of dementia.  EXAM: 12/05/2014.  COMPARISON:  None.  FINDINGS: 1236 hr. Two views obtained. The heart size and mediastinal contours are normal. The lungs are clear. There is no pleural effusion or pneumothorax. No acute osseous findings are identified. Thoracic spine degenerative changes noted.  IMPRESSION: No active cardiopulmonary process.   Electronically Signed   By: Carey Bullocks M.D.   On: 01/07/2015 13:16   Dg Thoracic Spine 2 View  01/07/2015   CLINICAL DATA:  Found down. Right periorbital hematoma. History of dementia and back surgery.  EXAM: THORACIC SPINE 2 VIEWS  COMPARISON:  Chest radiographs 01/07/2015 and 12/05/2014.  FINDINGS: There are 12 rib-bearing thoracic type vertebral bodies. The bones are mildly demineralized. There are mild degenerative changes throughout the thoracic spine with near anatomic alignment. No evidence of acute fracture or paraspinal hematoma.  IMPRESSION: No evidence of acute thoracic spine injury.   Electronically Signed   By: Carey Bullocks M.D.   On: 01/07/2015 13:18   Dg Lumbar Spine Complete  01/07/2015   CLINICAL  DATA:  Per ED note: To room via EMS. Pt from [facility]. Pt is sedated and is usually kept this way, if not, pt kicks and screams. Staff found pt on floor. Hematoma above right eye, abrasion on right knee. H/o HTN, dementia, back surgery  EXAM: LUMBAR SPINE - COMPLETE 4+ VIEW  COMPARISON:  11/27/2014  FINDINGS: No fracture. No spondylolisthesis. Mild loss disc height from L2-L3 through L4-L5. Mild facet degenerative change in the lower lumbar spine. Bones are demineralized. Soft tissues show aortic vascular calcifications right quadrant surgical suture material.  IMPRESSION: 1. No fracture or acute finding. Degenerative changes, stable from the prior study.   Electronically Signed   By: Amie Portland M.D.   On: 01/07/2015 13:17   Ct Head Wo Contrast  01/07/2015   CLINICAL DATA:  Patient fell, found on floor, large hematoma right had  EXAM: CT HEAD WITHOUT CONTRAST  CT CERVICAL SPINE WITHOUT CONTRAST  TECHNIQUE: Multidetector CT imaging of the head and cervical spine was performed following the standard protocol without intravenous contrast. Multiplanar CT image reconstructions of the cervical spine were also generated.  COMPARISON:  12/05/2014  FINDINGS: CT HEAD FINDINGS  Large right frontal temporal scalp hematoma. No skull fracture. No intracranial hemorrhage or extra-axial fluid. Atrophy with microvascular changes in the deep white matter. No infarct mass or hydrocephalus.  CT CERVICAL SPINE FINDINGS  Normal alignment. No fracture or soft tissue abnormality. Multilevel degenerative disc disease. Lung apices clear.  IMPRESSION: Large right scalp hematoma. No acute intracranial abnormalities. No evidence of cervical spine fracture.   Electronically Signed   By: Esperanza Heir M.D.   On: 01/07/2015 13:32   Ct Cervical Spine Wo Contrast  01/07/2015   CLINICAL DATA:  Patient fell, found on floor, large hematoma right had  EXAM: CT HEAD WITHOUT CONTRAST  CT CERVICAL SPINE WITHOUT CONTRAST  TECHNIQUE:  Multidetector CT imaging of the head and cervical spine was performed following the standard protocol without intravenous contrast. Multiplanar CT image reconstructions of the cervical spine were also generated.  COMPARISON:  12/05/2014  FINDINGS: CT HEAD FINDINGS  Large right frontal temporal scalp hematoma. No skull fracture. No intracranial hemorrhage or extra-axial fluid. Atrophy with microvascular changes in the deep white matter. No infarct mass or hydrocephalus.  CT CERVICAL SPINE FINDINGS  Normal alignment. No fracture or soft tissue abnormality. Multilevel degenerative disc disease. Lung apices clear.  IMPRESSION: Large right scalp hematoma. No acute intracranial abnormalities. No evidence of cervical spine fracture.   Electronically Signed   By: Esperanza Heir M.D.   On: 01/07/2015 13:32   Mr Brain Wo Contrast  01/08/2015   CLINICAL DATA:  81 year old female found lying down on ground near bed. Unwitnessed fall. Subsequent encounter.  EXAM: MRI HEAD WITHOUT CONTRAST  TECHNIQUE: Multiplanar, multiecho pulse sequences of the brain and surrounding structures were obtained without intravenous contrast.  COMPARISON:  01/07/2015 head CT.  10/24/2014 brain MR.  FINDINGS: Right frontal subcutaneous hematoma without evidence of intracranial hemorrhage.  No acute infarct.  Global atrophy without hydrocephalus.  Mild to moderate vessel disease type changes.  Major intracranial vascular structures are patent.  No intracranial mass lesion noted on this unenhanced exam.  Cervical medullary junction, pituitary region, pineal region and orbital structures unremarkable.  Minimal mucosal thickening paranasal sinuses.  IMPRESSION: Right frontal subcutaneous hematoma without evidence of intracranial hemorrhage.  No acute infarct.  Global atrophy without hydrocephalus.  Mild to moderate vessel disease type changes.   Electronically Signed   By: Lacy Duverney M.D.   On: 01/08/2015 09:03   Dg Chest Port 1 View  01/10/2015    CLINICAL DATA:  Aspiration pneumonia.  EXAM: PORTABLE CHEST - 1 VIEW  COMPARISON:  January 08, 2015.  FINDINGS: The heart size and mediastinal contours are within normal limits. No pneumothorax is noted. Severe degenerative change of right glenohumeral joint is noted. Left lung is clear. New right basilar opacity is noted concerning for aspiration pneumonia with possible associated pleural effusion.  IMPRESSION: New right lower lobe opacity is noted consistent with aspiration pneumonia.   Electronically Signed   By: Lupita Raider, M.D.   On: 01/10/2015 12:15   Dg Chest Port 1 View  01/08/2015   CLINICAL DATA:  Altered mental status.  EXAM: PORTABLE CHEST - 1 VIEW  COMPARISON:  01/07/2015.  FINDINGS: The heart size and mediastinal contours are  within normal limits. Mild left base pleural parenchymal thickening noted consistent with mild scarring. Lungs are otherwise clear. The visualized skeletal structures are unremarkable.  IMPRESSION: Mild left base pleural parenchymal thickening again consistent mild scarring. No other significant abnormality .   Electronically Signed   By: Maisie Fus  Register   On: 01/08/2015 07:55   Dg Chest Port 1v Same Day  01/11/2015   CLINICAL DATA:  Patient diagnosed with pneumonia 01/10/2015. Cough and congestion.  EXAM: PORTABLE CHEST - 1 VIEW SAME DAY  COMPARISON:  Single view of the chest 01/10/2015 and 01/08/2015.  FINDINGS: Aeration in the right lung base has markedly improved compared to yesterday's examination. The left lung appears clear. No pneumothorax or pleural effusion is identified. Heart size is normal. No focal bony abnormality seen. Marked right glenohumeral osteoarthritis and postoperative change of rotator cuff repair noted.  IMPRESSION: Marked improvement in right basilar airspace disease. No new abnormality.   Electronically Signed   By: Drusilla Kanner M.D.   On: 01/11/2015 16:01   Dg Hips Bilat With Pelvis 3-4 Views  01/07/2015   CLINICAL DATA:  Per ED note:  To room via EMS. Pt from [facility]. Pt is sedated and is usually kept this way, if not, pt kicks and screams. Staff found pt on floor. Hematoma above right eye, abrasion on right knee.  H/o HTN, dementia, back surgery  EXAM: DG HIP (WITH OR WITHOUT PELVIS) 3-4V BILAT  COMPARISON:  None.  FINDINGS: No fracture. No bone lesion. Bones demineralized. There is mild superior lateral joint space narrowing. No other arthropathic change. Soft tissues are unremarkable.  IMPRESSION: No fracture or acute finding.   Electronically Signed   By: Amie Portland M.D.   On: 01/07/2015 13:16    Microbiology: Recent Results (from the past 240 hour(s))  Urine culture     Status: None   Collection Time: 01/07/15  1:25 PM  Result Value Ref Range Status   Specimen Description URINE, CATHETERIZED  Final   Special Requests NONE  Final   Culture NO GROWTH 1 DAY  Final   Report Status 01/08/2015 FINAL  Final  Culture, blood (routine x 2)     Status: None   Collection Time: 01/07/15  2:48 PM  Result Value Ref Range Status   Specimen Description BLOOD RIGHT FOREARM  Final   Special Requests BOTTLES DRAWN AEROBIC AND ANAEROBIC 5CC  Final   Culture NO GROWTH 5 DAYS  Final   Report Status 01/12/2015 FINAL  Final  Culture, blood (routine x 2)     Status: None   Collection Time: 01/07/15  2:58 PM  Result Value Ref Range Status   Specimen Description BLOOD RIGHT HAND  Final   Special Requests BOTTLES DRAWN AEROBIC AND ANAEROBIC 3CC  Final   Culture NO GROWTH 5 DAYS  Final   Report Status 01/12/2015 FINAL  Final  MRSA PCR Screening     Status: None   Collection Time: 01/08/15 12:20 PM  Result Value Ref Range Status   MRSA by PCR NEGATIVE NEGATIVE Final    Comment:        The GeneXpert MRSA Assay (FDA approved for NASAL specimens only), is one component of a comprehensive MRSA colonization surveillance program. It is not intended to diagnose MRSA infection nor to guide or monitor treatment for MRSA  infections.      Labs: Basic Metabolic Panel:  Recent Labs Lab 01/11/15 1303 01/14/15 0502  NA 142 142  K 4.0 3.8  CL  103 105  CO2 28 27  GLUCOSE 96 90  BUN 41* 20  CREATININE 1.05* 0.86  CALCIUM 9.0 8.7*   CBC:  Recent Labs Lab 01/10/15 0517 01/11/15 1303 01/12/15 0400 01/14/15 0502  WBC 24.3* 19.2* 17.2* 11.4*  HGB 12.1 10.3* 9.7* 10.6*  HCT 37.1 31.3* 29.9* 31.3*  MCV 90.9 90.5 89.5 89.7  PLT 162 178 190 191     Signed:  Kadyn Guild S  [facility] 01/16/2015, 10:37 AM

## 2015-01-17 ENCOUNTER — Other Ambulatory Visit: Payer: Self-pay

## 2015-01-17 NOTE — Patient Outreach (Signed)
[facility]) Care Management  01/17/2015  FLORINE SPRENKLE 04/16/1921 [id number]   Referral from Charlesetta Shanks, RN, assigned Kathyrn Sheriff, RN.  Thanks, Corrie Mckusick. Sharlee Blew [facility] Care Management [facility] CM Assistant Phone: [phone] Fax: [phone]

## 2015-01-17 NOTE — Patient Outreach (Signed)
[facility]) Care Management  01/17/2015  [name] 1921/04/13 [id number]  Referral for Transition of care-spoke with member's son, [name] who reports that member is living in Assisted Living at [facility]. Mr. Harrison Mons states that the facility staff is handling all of member's care at this time, including medications. Mr. Harrison Mons reports that [facility] has member's discharge instructions and he does not know medications. Care Coordinator attempted to go over medications with Mr. Harrison Mons, but Mr. Harrison Mons referred care coordinator to speak with either Clydie Braun the head nurse or Azzie Almas the Librarian, academic. When asked about follow up appointments, Mr. Harrison Mons reports that member is being followed by the facility's doctor, Dr. Cathey Endow. Mr. Harrison Mons reports member is still getting adjusted to being back at the facility(wanting to go home) and states he is giving member time to adjust.  Plan: follow up with facility  Kathyrn Sheriff, RN, MSN, [facility] Care Coordinator Cell: [phone]

## 2015-01-17 NOTE — Patient Outreach (Signed)
[facility]) Care Management  01/17/2015  [name] [birth year] [id number]  Transition of care-Called and spoke with Head Nurse, Clydie Braun Reason who reports member is doing well. Ms. Reason states member is adjusting well..."she had an episode of anxiety last night trying to get oriented to her new surroundings...she did well today". Ms. Reason reports "we will get psych in to help manage her" and they have a provider, Arlyss Repress, NP to oversee her care. Medications reviewed.  Care Coordinator's contact information provided.  Plan: close case-member is in a long term care facility. Care is managed by [facility] facility.  Kathyrn Sheriff, RN, MSN, [facility] Care Coordinator Cell: [phone]

## 2015-01-18 ENCOUNTER — Telehealth: Payer: Self-pay | Admitting: *Deleted

## 2015-01-18 NOTE — Telephone Encounter (Signed)
Called pt/ son ([name]) mom was on tcm list d/c 01/16/15. He stated that mom is now living in a nursing home " [address]" and they doctors there are taking care of her. She is currently seeing Dr. Zachery Dauer...Raechel Chute

## 2015-01-23 DIAGNOSIS — I10 Essential (primary) hypertension: Secondary | ICD-10-CM | POA: Diagnosis not present

## 2015-01-23 DIAGNOSIS — J159 Unspecified bacterial pneumonia: Secondary | ICD-10-CM | POA: Diagnosis not present

## 2015-01-23 DIAGNOSIS — G309 Alzheimer's disease, unspecified: Secondary | ICD-10-CM | POA: Diagnosis not present

## 2015-01-23 DIAGNOSIS — M6281 Muscle weakness (generalized): Secondary | ICD-10-CM | POA: Diagnosis not present

## 2015-01-23 DIAGNOSIS — F0281 Dementia in other diseases classified elsewhere with behavioral disturbance: Secondary | ICD-10-CM | POA: Diagnosis not present

## 2015-01-29 DIAGNOSIS — R1312 Dysphagia, oropharyngeal phase: Secondary | ICD-10-CM | POA: Diagnosis not present

## 2015-01-29 DIAGNOSIS — G308 Other Alzheimer's disease: Secondary | ICD-10-CM | POA: Diagnosis not present

## 2015-01-29 DIAGNOSIS — R278 Other lack of coordination: Secondary | ICD-10-CM | POA: Diagnosis not present

## 2015-01-29 DIAGNOSIS — M6281 Muscle weakness (generalized): Secondary | ICD-10-CM | POA: Diagnosis not present

## 2015-01-30 DIAGNOSIS — M6281 Muscle weakness (generalized): Secondary | ICD-10-CM | POA: Diagnosis not present

## 2015-01-30 DIAGNOSIS — R278 Other lack of coordination: Secondary | ICD-10-CM | POA: Diagnosis not present

## 2015-01-30 DIAGNOSIS — G308 Other Alzheimer's disease: Secondary | ICD-10-CM | POA: Diagnosis not present

## 2015-01-30 DIAGNOSIS — R1312 Dysphagia, oropharyngeal phase: Secondary | ICD-10-CM | POA: Diagnosis not present

## 2015-01-31 DIAGNOSIS — G0430 Acute necrotizing hemorrhagic encephalopathy, unspecified: Secondary | ICD-10-CM | POA: Diagnosis not present

## 2015-01-31 DIAGNOSIS — M6281 Muscle weakness (generalized): Secondary | ICD-10-CM | POA: Diagnosis not present

## 2015-01-31 DIAGNOSIS — R278 Other lack of coordination: Secondary | ICD-10-CM | POA: Diagnosis not present

## 2015-01-31 DIAGNOSIS — G308 Other Alzheimer's disease: Secondary | ICD-10-CM | POA: Diagnosis not present

## 2015-01-31 DIAGNOSIS — R1312 Dysphagia, oropharyngeal phase: Secondary | ICD-10-CM | POA: Diagnosis not present

## 2015-02-01 DIAGNOSIS — G0430 Acute necrotizing hemorrhagic encephalopathy, unspecified: Secondary | ICD-10-CM | POA: Diagnosis not present

## 2015-02-02 DIAGNOSIS — M6281 Muscle weakness (generalized): Secondary | ICD-10-CM | POA: Diagnosis not present

## 2015-02-02 DIAGNOSIS — G0430 Acute necrotizing hemorrhagic encephalopathy, unspecified: Secondary | ICD-10-CM | POA: Diagnosis not present

## 2015-02-02 DIAGNOSIS — R278 Other lack of coordination: Secondary | ICD-10-CM | POA: Diagnosis not present

## 2015-02-02 DIAGNOSIS — G308 Other Alzheimer's disease: Secondary | ICD-10-CM | POA: Diagnosis not present

## 2015-02-02 DIAGNOSIS — R1312 Dysphagia, oropharyngeal phase: Secondary | ICD-10-CM | POA: Diagnosis not present

## 2015-02-02 NOTE — Patient Outreach (Signed)
[facility]) Care Management  02/02/2015  [name] Nov 30, 1920 [id number]   Notification from Kathyrn Sheriff, RN to close case as patient is enrolled in SNF.  Thanks, Corrie Mckusick. Sharlee Blew [facility] Care Management [facility] CM Assistant Phone: [phone] Fax: [phone]

## 2015-02-03 DIAGNOSIS — M6281 Muscle weakness (generalized): Secondary | ICD-10-CM | POA: Diagnosis not present

## 2015-02-03 DIAGNOSIS — G0430 Acute necrotizing hemorrhagic encephalopathy, unspecified: Secondary | ICD-10-CM | POA: Diagnosis not present

## 2015-02-03 DIAGNOSIS — G308 Other Alzheimer's disease: Secondary | ICD-10-CM | POA: Diagnosis not present

## 2015-02-03 DIAGNOSIS — R278 Other lack of coordination: Secondary | ICD-10-CM | POA: Diagnosis not present

## 2015-02-03 DIAGNOSIS — R1312 Dysphagia, oropharyngeal phase: Secondary | ICD-10-CM | POA: Diagnosis not present

## 2015-02-04 DIAGNOSIS — G0430 Acute necrotizing hemorrhagic encephalopathy, unspecified: Secondary | ICD-10-CM | POA: Diagnosis not present

## 2015-02-05 DIAGNOSIS — G0430 Acute necrotizing hemorrhagic encephalopathy, unspecified: Secondary | ICD-10-CM | POA: Diagnosis not present

## 2015-02-05 DIAGNOSIS — G308 Other Alzheimer's disease: Secondary | ICD-10-CM | POA: Diagnosis not present

## 2015-02-05 DIAGNOSIS — R1312 Dysphagia, oropharyngeal phase: Secondary | ICD-10-CM | POA: Diagnosis not present

## 2015-02-05 DIAGNOSIS — R278 Other lack of coordination: Secondary | ICD-10-CM | POA: Diagnosis not present

## 2015-02-05 DIAGNOSIS — M6281 Muscle weakness (generalized): Secondary | ICD-10-CM | POA: Diagnosis not present

## 2015-02-05 IMAGING — CR DG CHEST 2V
2 series · 2 of 2 positions shown · non-contrast
Comparison: 10/01/2013

CLINICAL DATA: Cough

EXAM:
CHEST  2 VIEW

[w chest pa]
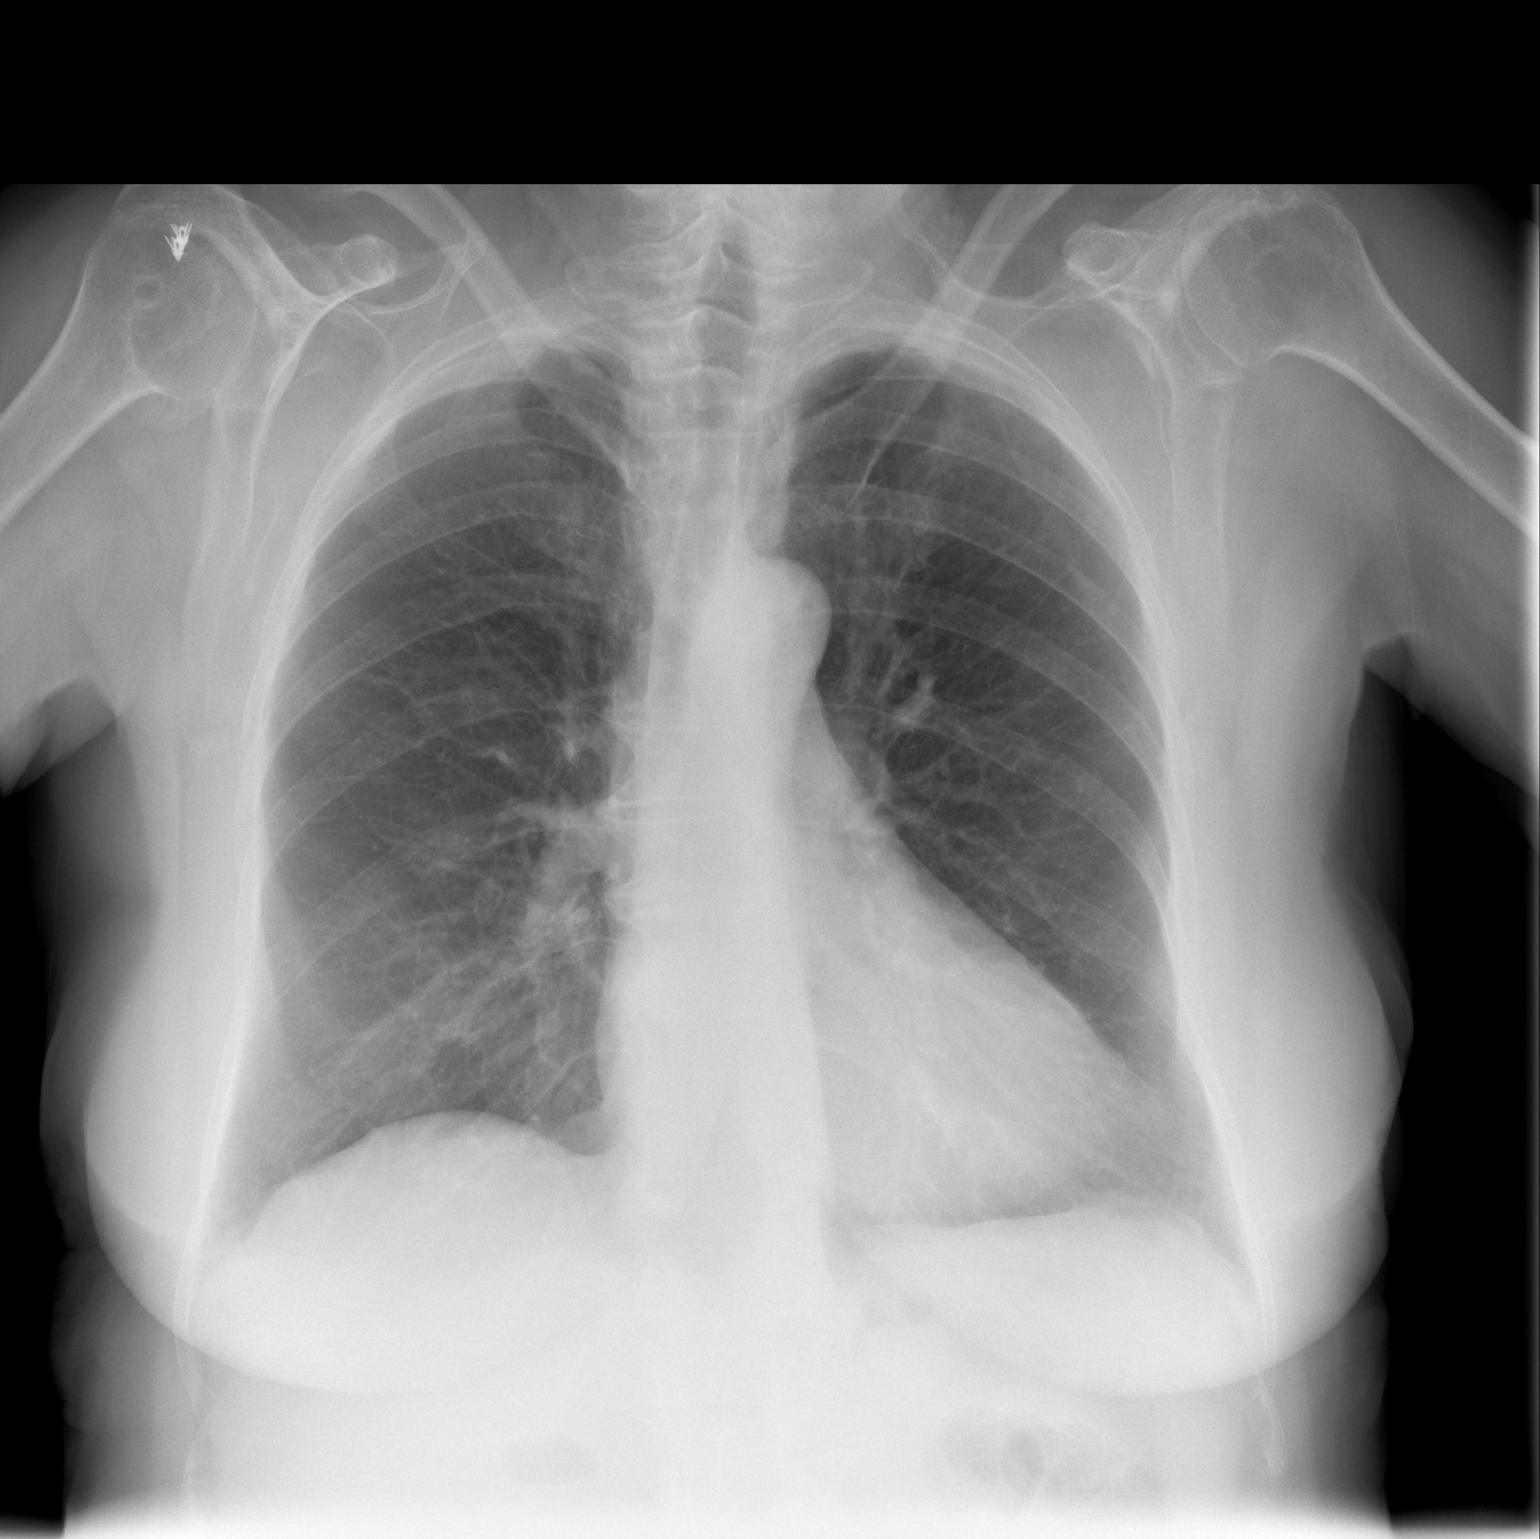

[w chest lat]
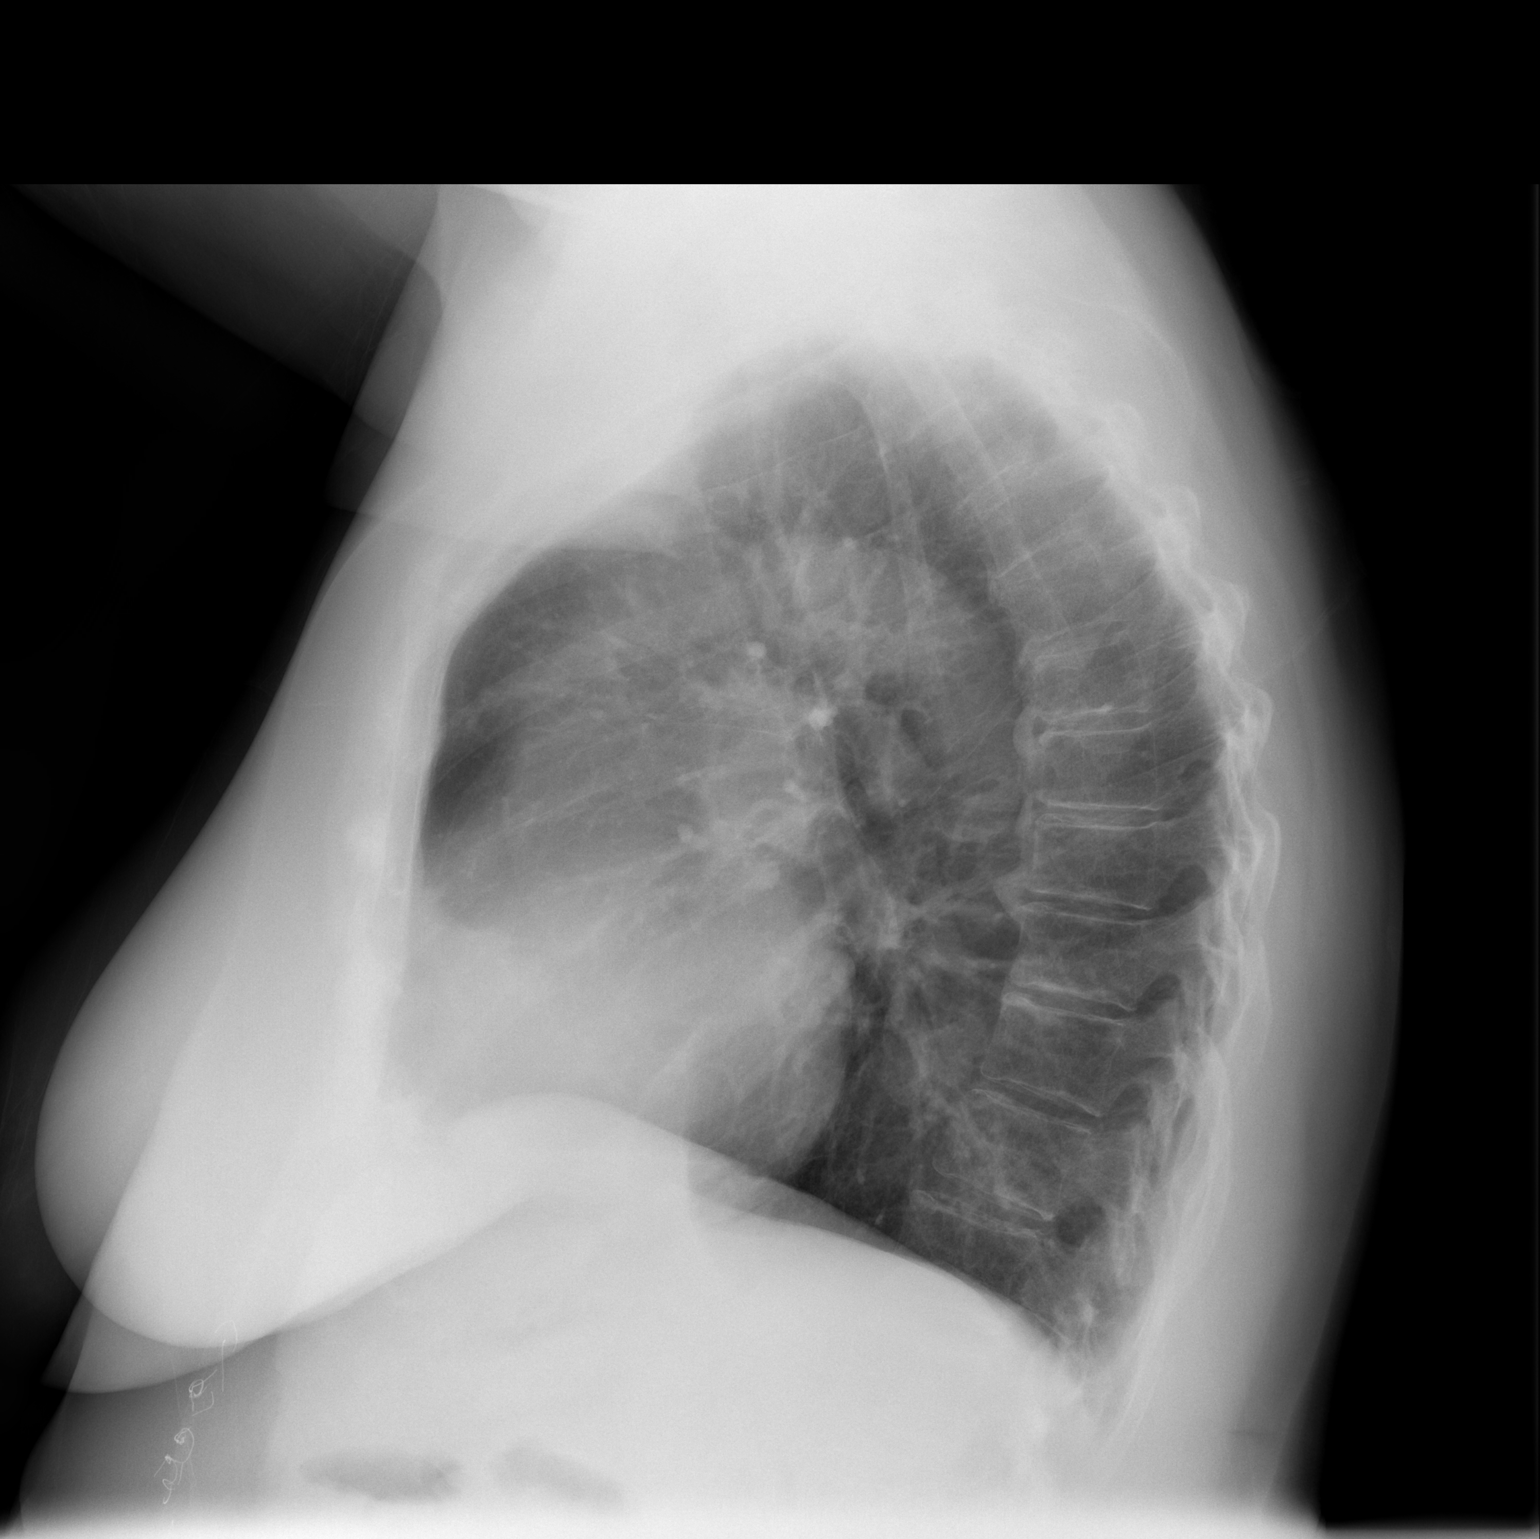

[2 of 2 positions shown; findings below may reference images not displayed]

FINDINGS: Heart size is upper normal but stable. Vascular pattern normal.
Lungs are clear with no consolidation or effusion.
IMPRESSION: No active cardiopulmonary disease.

## 2015-02-06 ENCOUNTER — Encounter (HOSPITAL_COMMUNITY): Payer: Self-pay

## 2015-02-06 ENCOUNTER — Emergency Department (HOSPITAL_COMMUNITY): Payer: Commercial Managed Care - HMO

## 2015-02-06 ENCOUNTER — Emergency Department (HOSPITAL_COMMUNITY)
Admission: EM | Admit: 2015-02-06 | Discharge: 2015-02-06 | Disposition: A | Discharge status: Other Acute Inpatient Hospital | Payer: Commercial Managed Care - HMO | Attending: Emergency Medicine | Admitting: Emergency Medicine

## 2015-02-06 DIAGNOSIS — Z7982 Long term (current) use of aspirin: Secondary | ICD-10-CM | POA: Insufficient documentation

## 2015-02-06 DIAGNOSIS — Z86010 Personal history of colonic polyps: Secondary | ICD-10-CM | POA: Diagnosis not present

## 2015-02-06 DIAGNOSIS — Z8701 Personal history of pneumonia (recurrent): Secondary | ICD-10-CM | POA: Insufficient documentation

## 2015-02-06 DIAGNOSIS — Y9389 Activity, other specified: Secondary | ICD-10-CM | POA: Diagnosis not present

## 2015-02-06 DIAGNOSIS — R011 Cardiac murmur, unspecified: Secondary | ICD-10-CM | POA: Insufficient documentation

## 2015-02-06 DIAGNOSIS — F419 Anxiety disorder, unspecified: Secondary | ICD-10-CM | POA: Diagnosis not present

## 2015-02-06 DIAGNOSIS — Z79899 Other long term (current) drug therapy: Secondary | ICD-10-CM | POA: Insufficient documentation

## 2015-02-06 DIAGNOSIS — F329 Major depressive disorder, single episode, unspecified: Secondary | ICD-10-CM | POA: Diagnosis not present

## 2015-02-06 DIAGNOSIS — N189 Chronic kidney disease, unspecified: Secondary | ICD-10-CM | POA: Diagnosis not present

## 2015-02-06 DIAGNOSIS — W050XXA Fall from non-moving wheelchair, initial encounter: Secondary | ICD-10-CM | POA: Diagnosis not present

## 2015-02-06 DIAGNOSIS — E039 Hypothyroidism, unspecified: Secondary | ICD-10-CM | POA: Insufficient documentation

## 2015-02-06 DIAGNOSIS — Z87440 Personal history of urinary (tract) infections: Secondary | ICD-10-CM | POA: Diagnosis not present

## 2015-02-06 DIAGNOSIS — Z87891 Personal history of nicotine dependence: Secondary | ICD-10-CM | POA: Insufficient documentation

## 2015-02-06 DIAGNOSIS — M25561 Pain in right knee: Secondary | ICD-10-CM | POA: Diagnosis not present

## 2015-02-06 DIAGNOSIS — Z8709 Personal history of other diseases of the respiratory system: Secondary | ICD-10-CM | POA: Diagnosis not present

## 2015-02-06 DIAGNOSIS — S8991XA Unspecified injury of right lower leg, initial encounter: Secondary | ICD-10-CM | POA: Insufficient documentation

## 2015-02-06 DIAGNOSIS — Y92128 Other place in nursing home as the place of occurrence of the external cause: Secondary | ICD-10-CM | POA: Insufficient documentation

## 2015-02-06 DIAGNOSIS — Y998 Other external cause status: Secondary | ICD-10-CM | POA: Diagnosis not present

## 2015-02-06 DIAGNOSIS — Z862 Personal history of diseases of the blood and blood-forming organs and certain disorders involving the immune mechanism: Secondary | ICD-10-CM | POA: Diagnosis not present

## 2015-02-06 DIAGNOSIS — I129 Hypertensive chronic kidney disease with stage 1 through stage 4 chronic kidney disease, or unspecified chronic kidney disease: Secondary | ICD-10-CM | POA: Diagnosis not present

## 2015-02-06 DIAGNOSIS — M25551 Pain in right hip: Secondary | ICD-10-CM | POA: Diagnosis not present

## 2015-02-06 DIAGNOSIS — M1990 Unspecified osteoarthritis, unspecified site: Secondary | ICD-10-CM | POA: Diagnosis not present

## 2015-02-06 DIAGNOSIS — Z043 Encounter for examination and observation following other accident: Secondary | ICD-10-CM | POA: Diagnosis not present

## 2015-02-06 DIAGNOSIS — Z8619 Personal history of other infectious and parasitic diseases: Secondary | ICD-10-CM | POA: Insufficient documentation

## 2015-02-06 DIAGNOSIS — S79911A Unspecified injury of right hip, initial encounter: Secondary | ICD-10-CM | POA: Diagnosis not present

## 2015-02-06 DIAGNOSIS — F0390 Unspecified dementia without behavioral disturbance: Secondary | ICD-10-CM | POA: Insufficient documentation

## 2015-02-06 DIAGNOSIS — I209 Angina pectoris, unspecified: Secondary | ICD-10-CM | POA: Insufficient documentation

## 2015-02-06 DIAGNOSIS — G0430 Acute necrotizing hemorrhagic encephalopathy, unspecified: Secondary | ICD-10-CM | POA: Diagnosis not present

## 2015-02-06 DIAGNOSIS — W19XXXA Unspecified fall, initial encounter: Secondary | ICD-10-CM

## 2015-02-06 LAB — URINALYSIS, ROUTINE W REFLEX MICROSCOPIC
Bilirubin Urine: NEGATIVE
GLUCOSE, UA: NEGATIVE mg/dL
HGB URINE DIPSTICK: NEGATIVE
KETONES UR: NEGATIVE mg/dL
Leukocytes, UA: NEGATIVE
Nitrite: NEGATIVE
PH: 5.5 (ref 5.0–8.0)
Protein, ur: NEGATIVE mg/dL
Specific Gravity, Urine: 1.013 (ref 1.005–1.030)
Urobilinogen, UA: 0.2 mg/dL (ref 0.0–1.0)

## 2015-02-06 LAB — CBC WITH DIFFERENTIAL/PLATELET
Basophils Absolute: 0 10*3/uL (ref 0.0–0.1)
Basophils Relative: 0 % (ref 0–1)
EOS ABS: 0.2 10*3/uL (ref 0.0–0.7)
EOS PCT: 2 % (ref 0–5)
HCT: 35.3 % — ABNORMAL LOW (ref 36.0–46.0)
Hemoglobin: 11.5 g/dL — ABNORMAL LOW (ref 12.0–15.0)
LYMPHS PCT: 21 % (ref 12–46)
Lymphs Abs: 1.7 10*3/uL (ref 0.7–4.0)
MCH: 29.5 pg (ref 26.0–34.0)
MCHC: 32.6 g/dL (ref 30.0–36.0)
MCV: 90.5 fL (ref 78.0–100.0)
MONO ABS: 0.5 10*3/uL (ref 0.1–1.0)
MONOS PCT: 6 % (ref 3–12)
Neutro Abs: 5.6 10*3/uL (ref 1.7–7.7)
Neutrophils Relative %: 71 % (ref 43–77)
PLATELETS: 236 10*3/uL (ref 150–400)
RBC: 3.9 MIL/uL (ref 3.87–5.11)
RDW: 13.6 % (ref 11.5–15.5)
WBC: 8 10*3/uL (ref 4.0–10.5)

## 2015-02-06 LAB — BASIC METABOLIC PANEL
Anion gap: 8 (ref 5–15)
BUN: 20 mg/dL (ref 6–20)
CHLORIDE: 108 mmol/L (ref 101–111)
CO2: 27 mmol/L (ref 22–32)
CREATININE: 0.77 mg/dL (ref 0.44–1.00)
Calcium: 9.1 mg/dL (ref 8.9–10.3)
GFR calc Af Amer: >60 mL/min (ref >60)
GFR calc non Af Amer: >60 mL/min (ref >60)
GLUCOSE: 94 mg/dL (ref 65–99)
POTASSIUM: 5.3 mmol/L — AB (ref 3.5–5.1)
SODIUM: 143 mmol/L (ref 135–145)

## 2015-02-06 MED ORDER — ONDANSETRON HCL 4 MG/2ML IJ SOLN
4.0000 mg | Freq: Once | INTRAMUSCULAR | Status: AC
  Administered 2015-02-06: 4 mg via INTRAVENOUS
  Filled 2015-02-06: qty 2

## 2015-02-06 MED ORDER — FENTANYL CITRATE (PF) 100 MCG/2ML IJ SOLN
50.0000 ug | Freq: Once | INTRAMUSCULAR | Status: AC
  Administered 2015-02-06: 50 ug via INTRAVENOUS
  Filled 2015-02-06: qty 2

## 2015-02-06 NOTE — ED Notes (Signed)
Ptar informed of return home. [facility] informed Arma Heading mt report called.

## 2015-02-06 NOTE — ED Provider Notes (Signed)
CSN: [id number]     Arrival date & time 02/06/15  7829 History   First MD Initiated Contact with Patient 02/06/15 [phone]     Chief Complaint  Patient presents with  . Fall  . Knee Injury     (Consider location/radiation/quality/duration/timing/severity/associated sxs/prior Treatment) HPI Comments: Ems and pt report a fall. Nursing home states that she tried to walk to the bathroom herself. At baseline pt is in a wheelchair. Pt states that her right side hurts.  The history is provided by the nursing home and the EMS personnel. No language interpreter was used.    Past Medical History  Diagnosis Date  . Arthritis   . Chicken pox   . Chronic kidney disease   . Colon polyps   . Hypertension   . Dementia   . Angina   . Dysrhythmia   . Heart murmur   . Shortness of breath   . Recurrent upper respiratory infection (URI)   . Pneumonia   . Hypothyroidism   . Anemia   . Headache(784.0)   . Anxiety   . Depression   . Valvular heart disease 09/04/2012  . Left leg pain 10/02/2012  . UTI (urinary tract infection) 12/30/2012  . Acute bronchitis 04/10/2013  . Dehydration 08/31/2013  . Arthritis 10/02/2012   Past Surgical History  Procedure Laterality Date  . Appendectomy    . Cholecystectomy    . Kidney stones    . Tonsillectomy    . Abdominal hysterectomy    . Hand surgery    . Feet surgery    . Shoulder arthroscopy    . Back surgery    . Tonsillectomy    . Eye surgery      cataract removed and eye lids lifted  . Fracture surgery      bilateral arms  . Tubal ligation    . Colonoscopy w/ polypectomy     Family History  Problem Relation Age of Onset  . Heart disease Mother   . Heart disease Father   . Heart disease Other   . Birth defects Other    Social History  Substance Use Topics  . Smoking status: Former Smoker -- 0.20 packs/day for 1 years    Types: Cigarettes    Quit date: 06/02/1941  . Smokeless tobacco: Never Used  . Alcohol Use: No   OB History    No data  available     Review of Systems  Unable to perform ROS: Dementia  All other systems reviewed and are negative.     Allergies  Morphine and related; Codeine; and Hydrocodone  Home Medications   Prior to Admission medications   Medication Sig Start Date End Date Taking? Authorizing Provider  albuterol (PROVENTIL HFA;VENTOLIN HFA) 108 (90 BASE) MCG/ACT inhaler Inhale 2 puffs into the lungs every 6 (six) hours as needed for wheezing or shortness of breath. 09/23/13  Yes Sandford Craze, NP  aspirin 81 MG tablet Take 81 mg by mouth daily.   Yes Historical Provider, MD  clonazePAM (KLONOPIN) 0.5 MG tablet Take 0.5 tablets (0.25 mg total) by mouth 2 (two) times daily. 01/16/15  Yes Meredeth Ide, MD  clonazePAM (KLONOPIN) 1 MG tablet Take 1 mg by mouth every 4 (four) hours as needed for anxiety.   Yes Historical Provider, MD  levothyroxine (SYNTHROID, LEVOTHROID) 50 MCG tablet TAKE 1 TABLET (50 MCG TOTAL) BY MOUTH DAILY. 09/25/14  Yes Judie Bonus, MD  risperiDONE (RISPERDAL) 1 MG tablet Take 1 mg by mouth  at bedtime.   Yes Historical Provider, MD  traZODone (DESYREL) 50 MG tablet Take 1 tablet (50 mg total) by mouth at bedtime. 01/16/15  Yes Meredeth Ide, MD   BP 146/55 mmHg  Pulse 61  Temp(Src) 97.9 F (36.6 C) (Oral)  Resp 20  SpO2 95% Physical Exam  Constitutional: She appears well-developed and well-nourished.  Cardiovascular: Normal rate and regular rhythm.   Pulmonary/Chest: Effort normal and breath sounds normal.  Abdominal: Soft. Bowel sounds are normal. There is no tenderness.  Musculoskeletal:  Tender to the right hip. Unable to flex  Neurological: She is alert.  Skin: Skin is warm.  Nursing note and vitals reviewed.   ED Course  Procedures (including critical care time) Labs Review Labs Reviewed  BASIC METABOLIC PANEL - Abnormal; Notable for the following:    Potassium 5.3 (*)    All other components within normal limits  CBC WITH DIFFERENTIAL/PLATELET -  Abnormal; Notable for the following:    Hemoglobin 11.5 (*)    HCT 35.3 (*)    All other components within normal limits  URINALYSIS, ROUTINE W REFLEX MICROSCOPIC (NOT AT [facility])    Imaging Review Dg Chest 2 View  02/06/2015   CLINICAL DATA:  Unwitnessed fall  EXAM: CHEST  2 VIEW  COMPARISON:  01/11/2015  FINDINGS: Chronic mild cardiomegaly. Negative aortic and hilar contours. Continued improvement in right lung aeration with resolved right infrahilar opacity. There is no edema, consolidation, effusion, or pneumothorax.  No acute osseous findings. Rotator cuff repair with advanced glenohumeral osteoarthritis on the right.  IMPRESSION: No active cardiopulmonary disease.   Electronically Signed   By: Marnee Spring M.D.   On: 02/06/2015 10:46   Dg Knee Complete 4 Views Right  02/06/2015   CLINICAL DATA:  Status post fall.  Right knee pain.  EXAM: RIGHT KNEE - COMPLETE 4+ VIEW  COMPARISON:  None.  FINDINGS: There is no evidence of fracture, dislocation, or joint effusion. There is chondrocalcinosis of the medial and lateral femorotibial compartments as can be seen with CPPD. There is mild tricompartmental osteoarthritis. There is generalized osteopenia. Soft tissues are unremarkable.  IMPRESSION: No acute osseous injury of the right knee.   Electronically Signed   By: Elige Ko   On: 02/06/2015 10:47   Dg Hip Unilat With Pelvis 2-3 Views Right  02/06/2015   CLINICAL DATA:  Unwitnessed fall.  EXAM: DG HIP (WITH OR WITHOUT PELVIS) 2-3V RIGHT  COMPARISON:  None.  FINDINGS: Diffuse osteopenia and degenerative change. No acute bony or joint abnormality identified. No evidence of fracture or dislocation.  IMPRESSION: Diffuse osteopenia degenerative change.  No acute abnormality .   Electronically Signed   By: Maisie Fus  Register   On: 02/06/2015 10:48   I have personally reviewed and evaluated these images and lab results as part of my medical decision-making.   EKG Interpretation None      MDM   Final  diagnoses:  Fall, initial encounter  Hip pain, right  Right knee pain    Pt without acute bony abnormality. Noted hyperkalemia likely related to hemolysis. Think pt is okay to go back to the nursing home at this time    Teressa Lower, NP 02/06/15 1315  Pricilla Loveless, MD 02/07/15 [phone]

## 2015-02-06 NOTE — ED Notes (Signed)
Bib ems from [facility] s/p unwitnessed fall, found on floor. Pt states slid out of bed, c/o right knee pain. No deformity. Dementia pt at baseline.

## 2015-02-06 NOTE — ED Notes (Signed)
Bed: WA01 Expected date:  Expected time:  Means of arrival:  Comments: [id number] fall, knee pain.

## 2015-02-06 NOTE — Discharge Instructions (Signed)
Fall Prevention and Home Safety Falls cause injuries and can affect all age groups. It is possible to use preventive measures to significantly decrease the likelihood of falls. There are many simple measures which can make your home safer and prevent falls. OUTDOORS  Repair cracks and edges of walkways and driveways.  Remove high doorway thresholds.  Trim shrubbery on the main path into your home.  Have good outside lighting.  Clear walkways of tools, rocks, debris, and clutter.  Check that handrails are not broken and are securely fastened. Both sides of steps should have handrails.  Have leaves, snow, and ice cleared regularly.  Use sand or salt on walkways during winter months.  In the garage, clean up grease or oil spills. BATHROOM  Install night lights.  Install grab bars by the toilet and in the tub and shower.  Use non-skid mats or decals in the tub or shower.  Place a plastic non-slip stool in the shower to sit on, if needed.  Keep floors dry and clean up all water on the floor immediately.  Remove soap buildup in the tub or shower on a regular basis.  Secure bath mats with non-slip, double-sided rug tape.  Remove throw rugs and tripping hazards from the floors. BEDROOMS  Install night lights.  Make sure a bedside light is easy to reach.  Do not use oversized bedding.  Keep a telephone by your bedside.  Have a firm chair with side arms to use for getting dressed.  Remove throw rugs and tripping hazards from the floor. KITCHEN  Keep handles on pots and pans turned toward the center of the stove. Use back burners when possible.  Clean up spills quickly and allow time for drying.  Avoid walking on wet floors.  Avoid hot utensils and knives.  Position shelves so they are not too high or low.  Place commonly used objects within easy reach.  If necessary, use a sturdy step stool with a grab bar when reaching.  Keep electrical cables out of the  way.  Do not use floor polish or wax that makes floors slippery. If you must use wax, use non-skid floor wax.  Remove throw rugs and tripping hazards from the floor. STAIRWAYS  Never leave objects on stairs.  Place handrails on both sides of stairways and use them. Fix any loose handrails. Make sure handrails on both sides of the stairways are as long as the stairs.  Check carpeting to make sure it is firmly attached along stairs. Make repairs to worn or loose carpet promptly.  Avoid placing throw rugs at the top or bottom of stairways, or properly secure the rug with carpet tape to prevent slippage. Get rid of throw rugs, if possible.  Have an electrician put in a light switch at the top and bottom of the stairs. OTHER FALL PREVENTION TIPS  Wear low-heel or rubber-soled shoes that are supportive and fit well. Wear closed toe shoes.  When using a stepladder, make sure it is fully opened and both spreaders are firmly locked. Do not climb a closed stepladder.  Add color or contrast paint or tape to grab bars and handrails in your home. Place contrasting color strips on first and last steps.  Learn and use mobility aids as needed. Install an electrical emergency response system.  Turn on lights to avoid dark areas. Replace light bulbs that burn out immediately. Get light switches that glow.  Arrange furniture to create clear pathways. Keep furniture in the same place.  Firmly attach carpet with non-skid or double-sided tape.  Eliminate uneven floor surfaces.  Select a carpet pattern that does not visually hide the edge of steps.  Be aware of all pets. OTHER HOME SAFETY TIPS  Set the water temperature for 120 F (48.8 C).  Keep emergency numbers on or near the telephone.  Keep smoke detectors on every level of the home and near sleeping areas. Document Released: 05/09/2002 Document Revised: 11/18/2011 Document Reviewed: 08/08/2011 [facility] Patient Information 2015  [place], [place]. This information is not intended to replace advice given to you by your health care provider. Make sure you discuss any questions you have with your health care provider.  Arthritis, Nonspecific Arthritis is inflammation of a joint. This usually means pain, redness, warmth or swelling are present. One or more joints may be involved. There are a number of types of arthritis. Your caregiver may not be able to tell what type of arthritis you have right away. CAUSES  The most common cause of arthritis is the wear and tear on the joint (osteoarthritis). This causes damage to the cartilage, which can break down over time. The knees, hips, back and neck are most often affected by this type of arthritis. Other types of arthritis and common causes of joint pain include:  Sprains and other injuries near the joint. Sometimes minor sprains and injuries cause pain and swelling that develop hours later.  Rheumatoid arthritis. This affects hands, feet and knees. It usually affects both sides of your body at the same time. It is often associated with chronic ailments, fever, weight loss and general weakness.  Crystal arthritis. Gout and pseudo gout can cause occasional acute severe pain, redness and swelling in the foot, ankle, or knee.  Infectious arthritis. Bacteria can get into a joint through a break in overlying skin. This can cause infection of the joint. Bacteria and viruses can also spread through the blood and affect your joints.  Drug, infectious and allergy reactions. Sometimes joints can become mildly painful and slightly swollen with these types of illnesses. SYMPTOMS   Pain is the main symptom.  Your joint or joints can also be red, swollen and warm or hot to the touch.  You may have a fever with certain types of arthritis, or even feel overall ill.  The joint with arthritis will hurt with movement. Stiffness is present with some types of arthritis. DIAGNOSIS  Your caregiver  will suspect arthritis based on your description of your symptoms and on your exam. Testing may be needed to find the type of arthritis:  Blood and sometimes urine tests.  X-ray tests and sometimes CT or MRI scans.  Removal of fluid from the joint (arthrocentesis) is done to check for bacteria, crystals or other causes. Your caregiver (or a specialist) will numb the area over the joint with a local anesthetic, and use a needle to remove joint fluid for examination. This procedure is only minimally uncomfortable.  Even with these tests, your caregiver may not be able to tell what kind of arthritis you have. Consultation with a specialist (rheumatologist) may be helpful. TREATMENT  Your caregiver will discuss with you treatment specific to your type of arthritis. If the specific type cannot be determined, then the following general recommendations may apply. Treatment of severe joint pain includes:  Rest.  Elevation.  Anti-inflammatory medication (for example, ibuprofen) may be prescribed. Avoiding activities that cause increased pain.  Only take over-the-counter or prescription medicines for pain and discomfort as recommended by your  caregiver.  Cold packs over an inflamed joint may be used for 10 to 15 minutes every hour. Hot packs sometimes feel better, but do not use overnight. Do not use hot packs if you are diabetic without your caregiver's permission.  A cortisone shot into arthritic joints may help reduce pain and swelling.  Any acute arthritis that gets worse over the next 1 to 2 days needs to be looked at to be sure there is no joint infection. Long-term arthritis treatment involves modifying activities and lifestyle to reduce joint stress jarring. This can include weight loss. Also, exercise is needed to nourish the joint cartilage and remove waste. This helps keep the muscles around the joint strong. HOME CARE INSTRUCTIONS   Do not take aspirin to relieve pain if gout is  suspected. This elevates uric acid levels.  Only take over-the-counter or prescription medicines for pain, discomfort or fever as directed by your caregiver.  Rest the joint as much as possible.  If your joint is swollen, keep it elevated.  Use crutches if the painful joint is in your leg.  Drinking plenty of fluids may help for certain types of arthritis.  Follow your caregiver's dietary instructions.  Try low-impact exercise such as:  Swimming.  Water aerobics.  Biking.  Walking.  Morning stiffness is often relieved by a warm shower.  Put your joints through regular range-of-motion. SEEK MEDICAL CARE IF:   You do not feel better in 24 hours or are getting worse.  You have side effects to medications, or are not getting better with treatment. SEEK IMMEDIATE MEDICAL CARE IF:   You have a fever.  You develop severe joint pain, swelling or redness.  Many joints are involved and become painful and swollen.  There is severe back pain and/or leg weakness.  You have loss of bowel or bladder control. Document Released: 06/26/2004 Document Revised: 08/11/2011 Document Reviewed: 07/12/2008 [facility] Patient Information 2015 [place], [place]. This information is not intended to replace advice given to you by your health care provider. Make sure you discuss any questions you have with your health care provider.

## 2015-02-06 NOTE — ED Notes (Signed)
Pt awake alert at baseline. Respirations easy non labored.

## 2015-02-07 DIAGNOSIS — R278 Other lack of coordination: Secondary | ICD-10-CM | POA: Diagnosis not present

## 2015-02-07 DIAGNOSIS — M6281 Muscle weakness (generalized): Secondary | ICD-10-CM | POA: Diagnosis not present

## 2015-02-07 DIAGNOSIS — G0430 Acute necrotizing hemorrhagic encephalopathy, unspecified: Secondary | ICD-10-CM | POA: Diagnosis not present

## 2015-02-07 DIAGNOSIS — G308 Other Alzheimer's disease: Secondary | ICD-10-CM | POA: Diagnosis not present

## 2015-02-07 DIAGNOSIS — R1312 Dysphagia, oropharyngeal phase: Secondary | ICD-10-CM | POA: Diagnosis not present

## 2015-02-08 DIAGNOSIS — G0430 Acute necrotizing hemorrhagic encephalopathy, unspecified: Secondary | ICD-10-CM | POA: Diagnosis not present

## 2015-02-08 DIAGNOSIS — R1312 Dysphagia, oropharyngeal phase: Secondary | ICD-10-CM | POA: Diagnosis not present

## 2015-02-08 DIAGNOSIS — G308 Other Alzheimer's disease: Secondary | ICD-10-CM | POA: Diagnosis not present

## 2015-02-08 DIAGNOSIS — R278 Other lack of coordination: Secondary | ICD-10-CM | POA: Diagnosis not present

## 2015-02-08 DIAGNOSIS — M6281 Muscle weakness (generalized): Secondary | ICD-10-CM | POA: Diagnosis not present

## 2015-02-08 IMAGING — CT CT HEAD W/O CM
1 series · 16 of 30 positions shown, 20 images · non-contrast
Comparison: Head CT 10/01/2013.

CLINICAL DATA: c/o pressure in skull and vision changes. assess for
mass.

EXAM:
CT HEAD WITHOUT CONTRAST
TECHNIQUE: Contiguous axial images were obtained from the base of the skull
through the vertex without intravenous contrast.

[Series 2: head 4.8 h37s · axial · 0.41mm/px · z∈[+1359,+1496]mm · 16 of 32 slices shown, 20 images]
[im 2/32  brain]
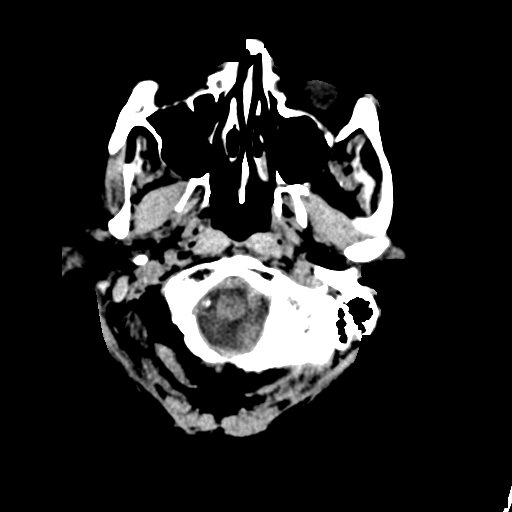
[im 2/32  bone]
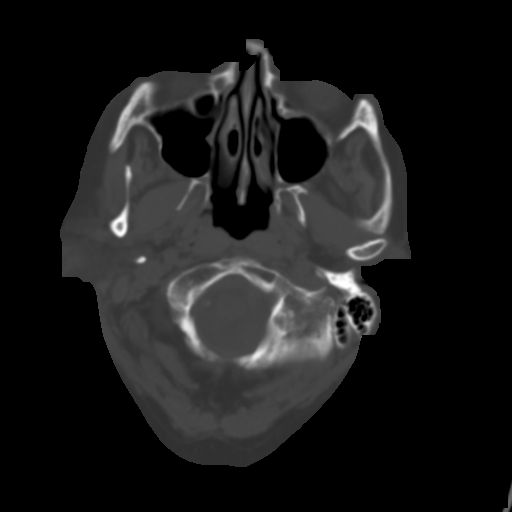
[im 4/32  brain]
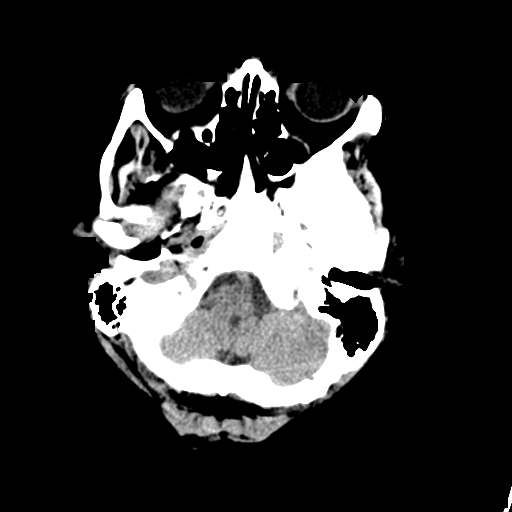
[im 6/32  brain]
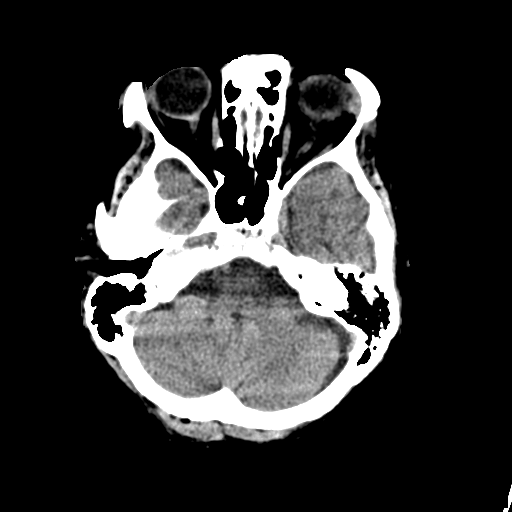
[im 8/32  brain]
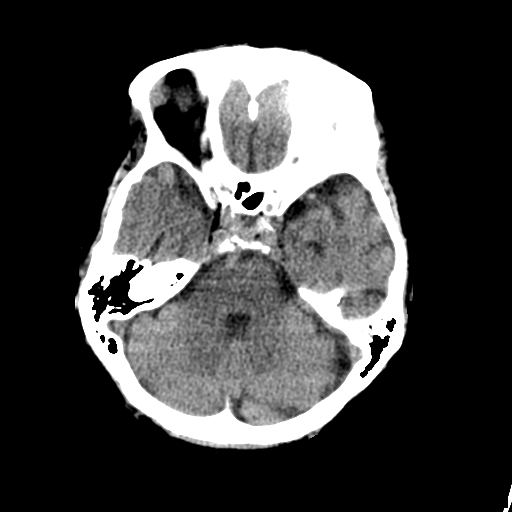
[im 9/32  brain]
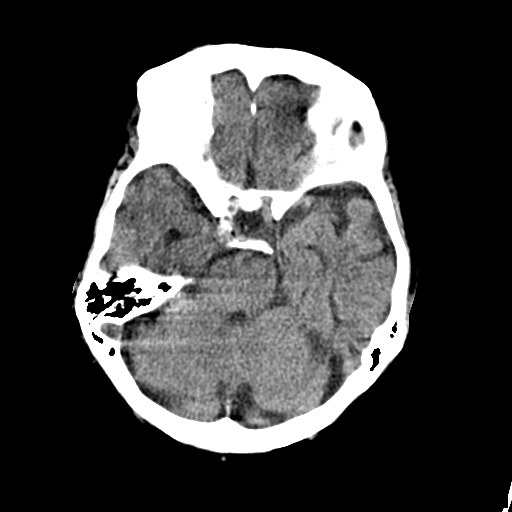
[im 9/32  bone]
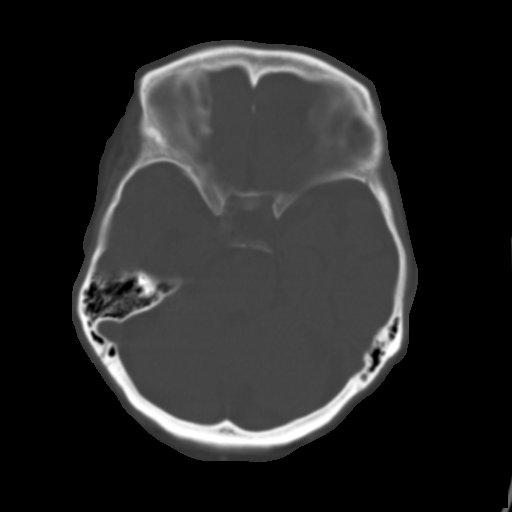
[im 11/32  brain]
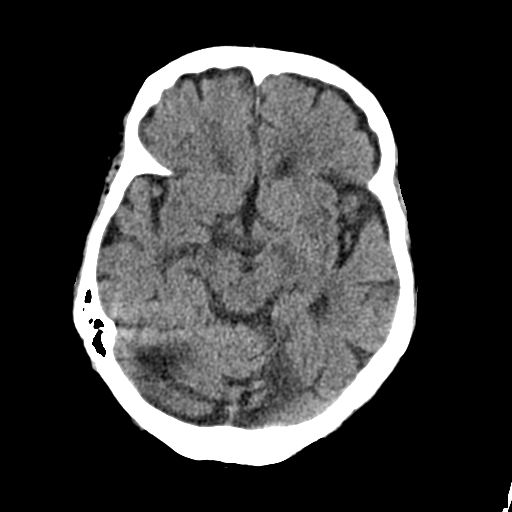
[im 13/32  brain]
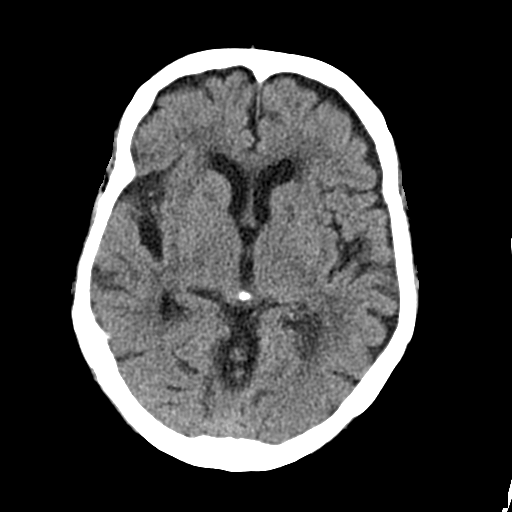
[im 15/32  brain]
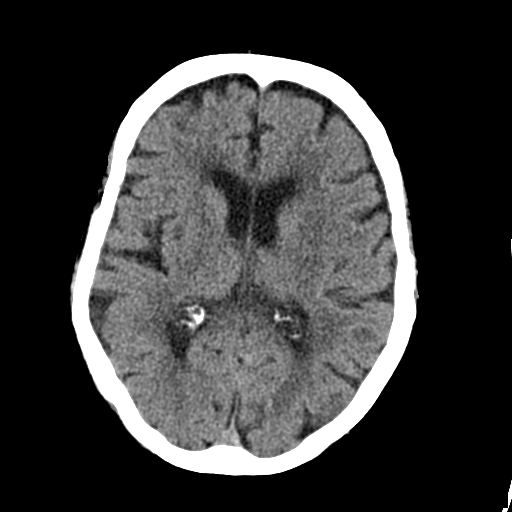
[im 17/32  brain]
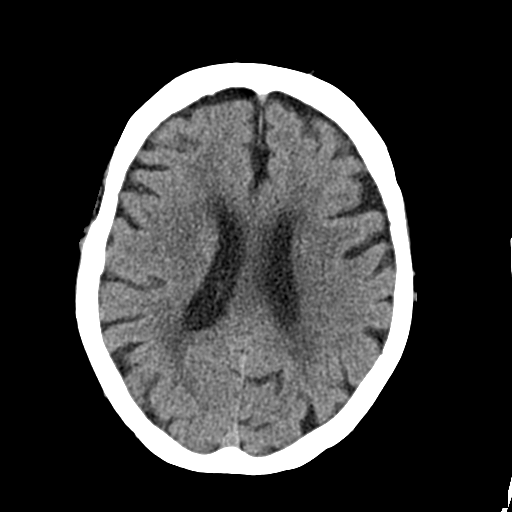
[im 17/32  bone]
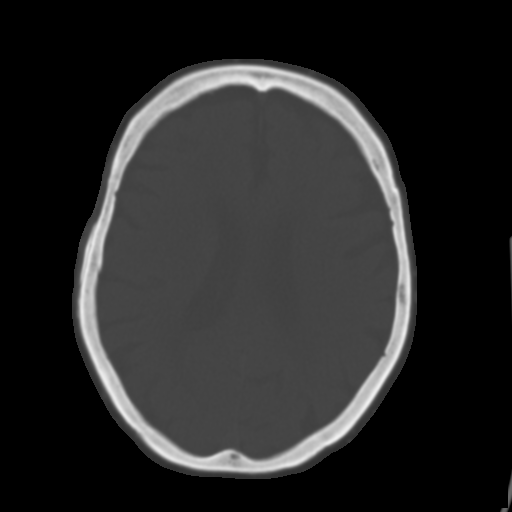
[im 19/32  brain]
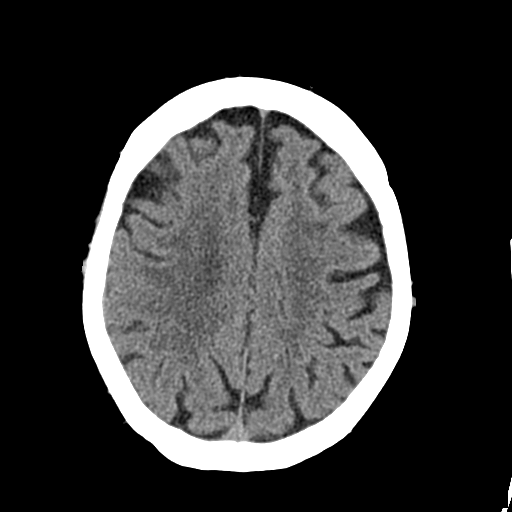
[im 21/32  brain]
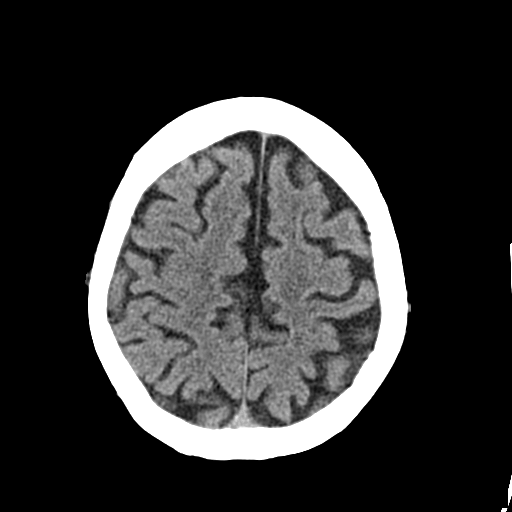
[im 23/32  brain]
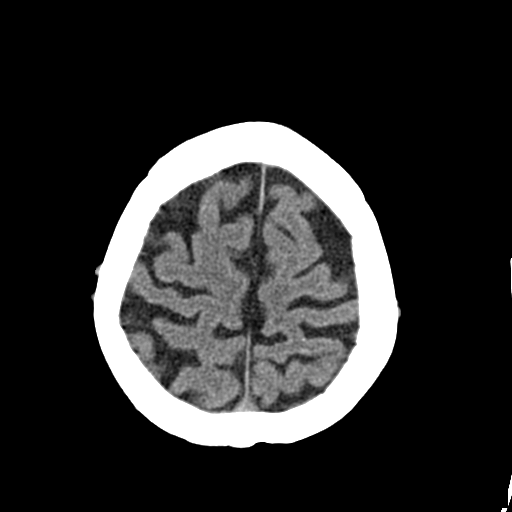
[im 24/32  brain]
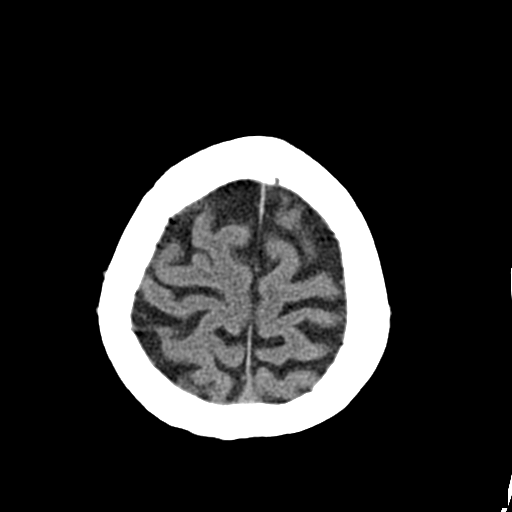
[im 24/32  bone]
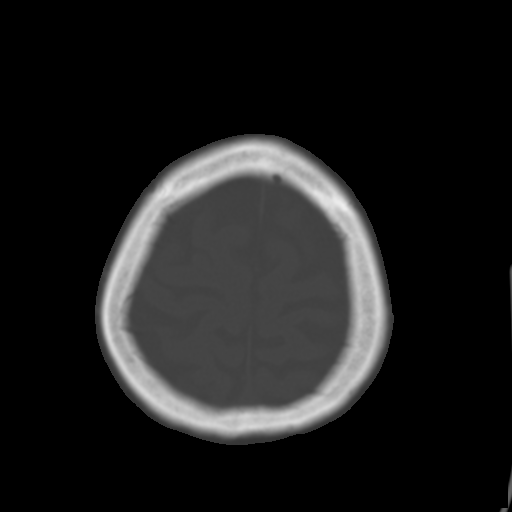
[im 26/32  brain]
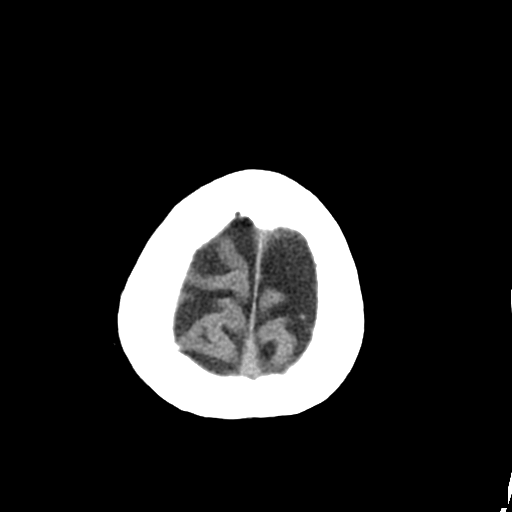
[im 28/32  brain]
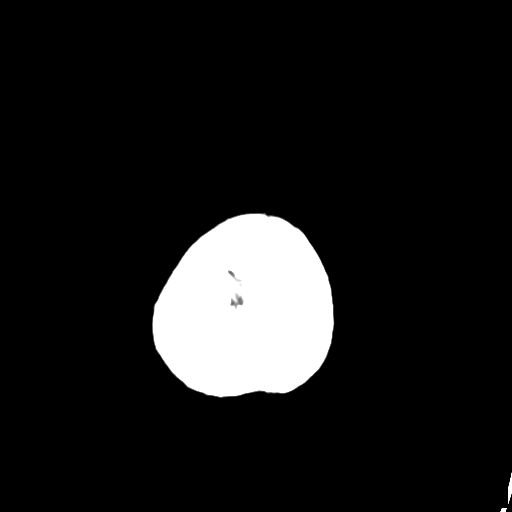
[im 30/32  brain]
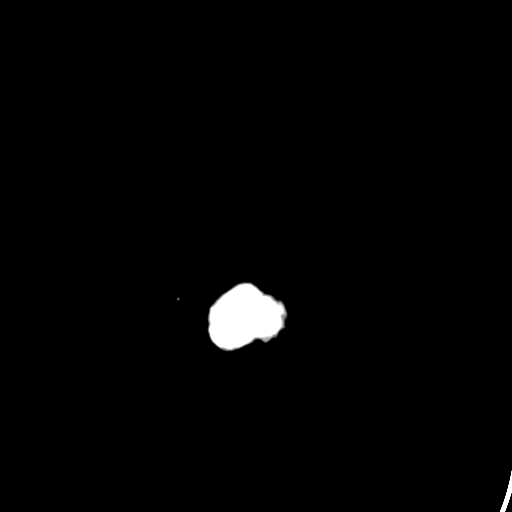

[16 of 30 positions shown; findings below may reference images not displayed]

FINDINGS: No acute intracranial abnormality. Specifically, no hemorrhage,
hydrocephalus, mass lesion, acute infarction, or significant
intracranial injury. No acute calvarial abnormality. Age-appropriate
global atrophy. Areas of low attenuation within the subcortical,
deep, and periventricular white matter regions. Mild residual
mucosal thickening within the ethmoid air cells. Area of mucosal
thickening within the sphenoid air cells has resolved. Mastoid air
cells are patent.
IMPRESSION: No acute intracranial abnormality. Specifically, no hemorrhage,
hydrocephalus, mass lesion, acute infarction, or significant
intracranial injury. No acute calvarial abnormality. Chronic and
involutional changes. Improved sinus disease.

## 2015-02-08 IMAGING — US US EXTREM LOW VENOUS*L*
1 series · 13 of 21 positions shown · non-contrast
Comparison: None.

CLINICAL DATA: Left calf pain and edema



[Series 1: us extrem low venous*left* · 13 of 21 slices shown]
[im 1/21]
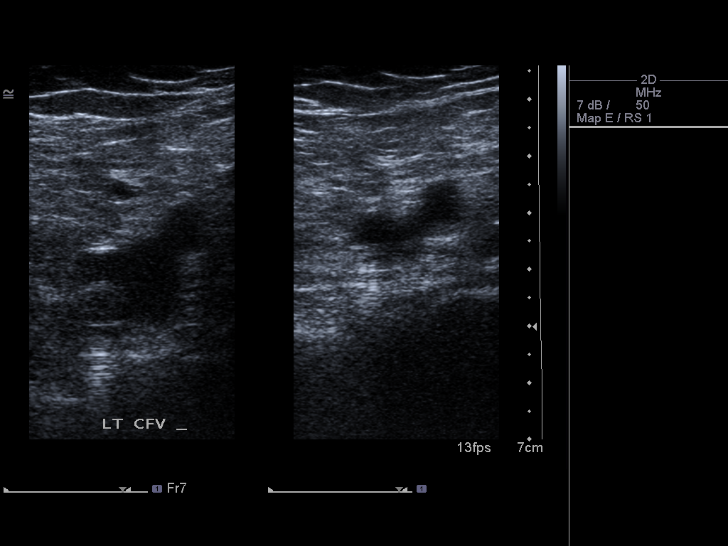
[im 3/21]
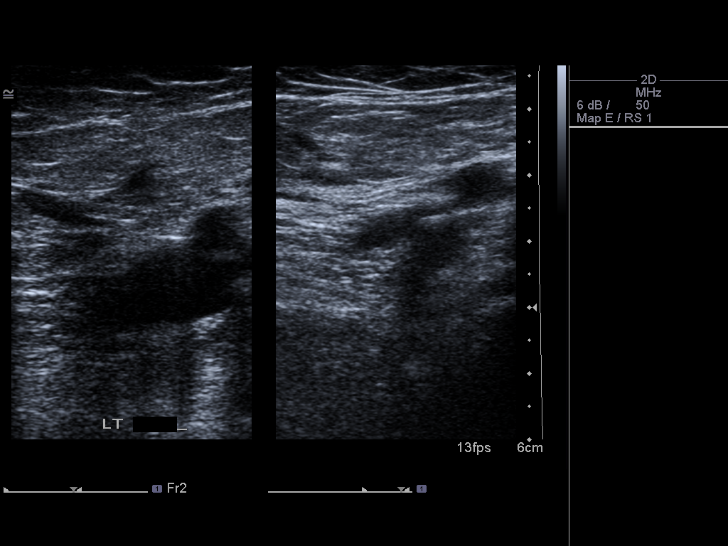
[im 5/21]
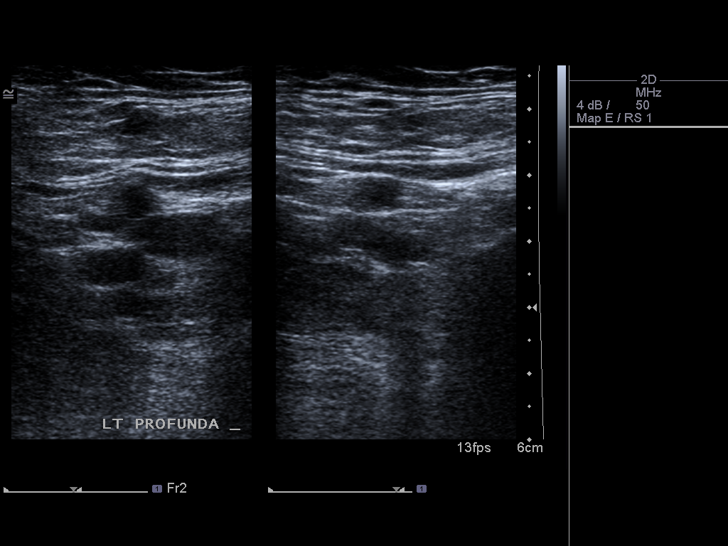
[im 6/21]
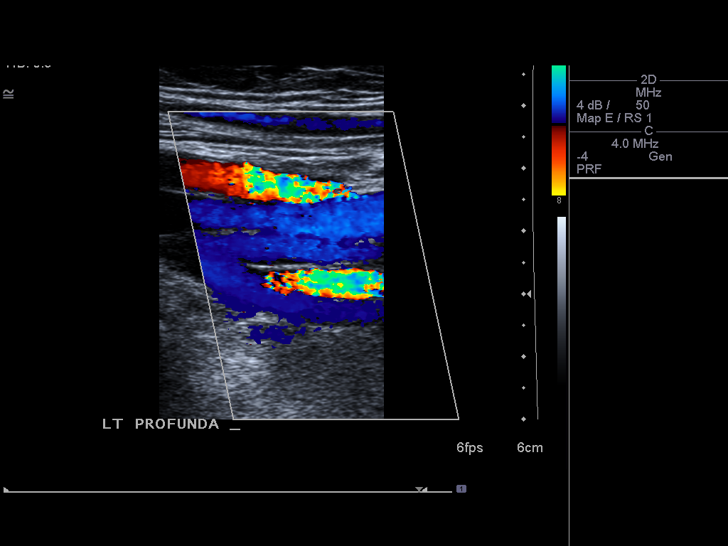
[im 8/21]
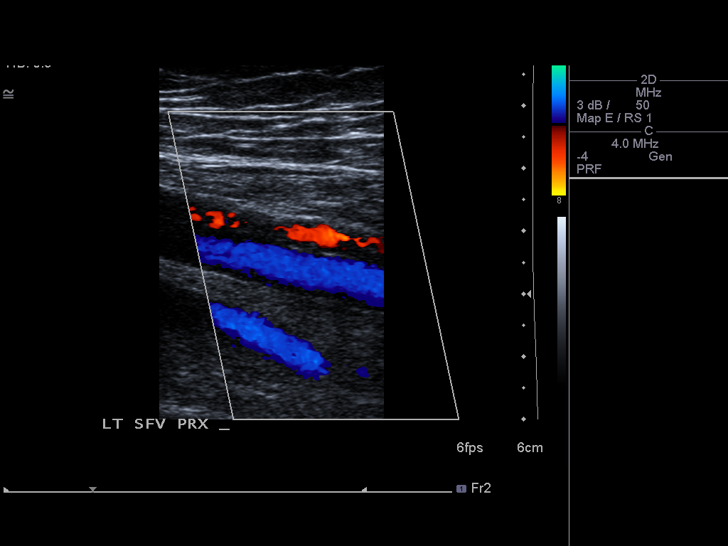
[im 9/21]
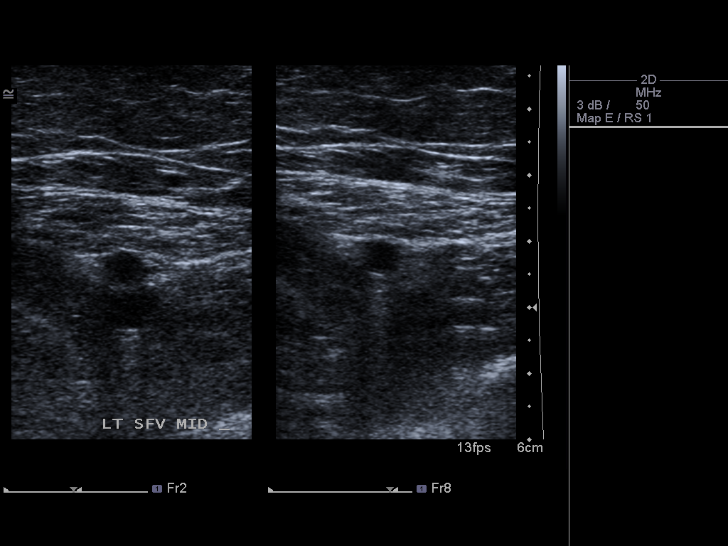
[im 11/21]
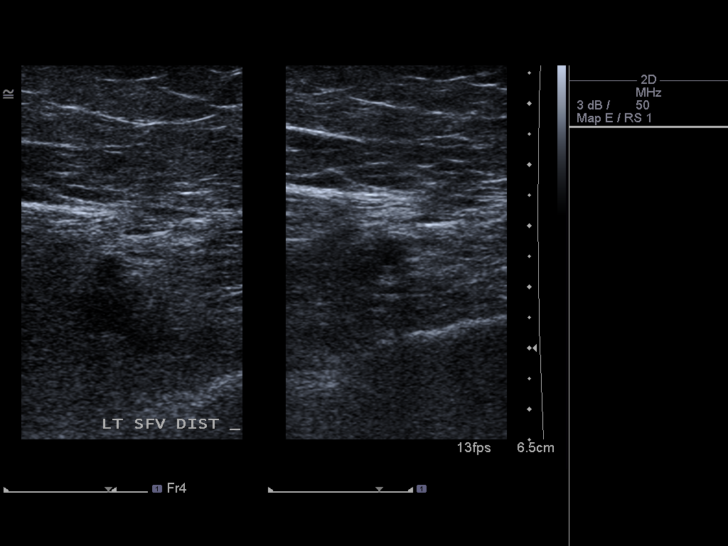
[im 13/21]
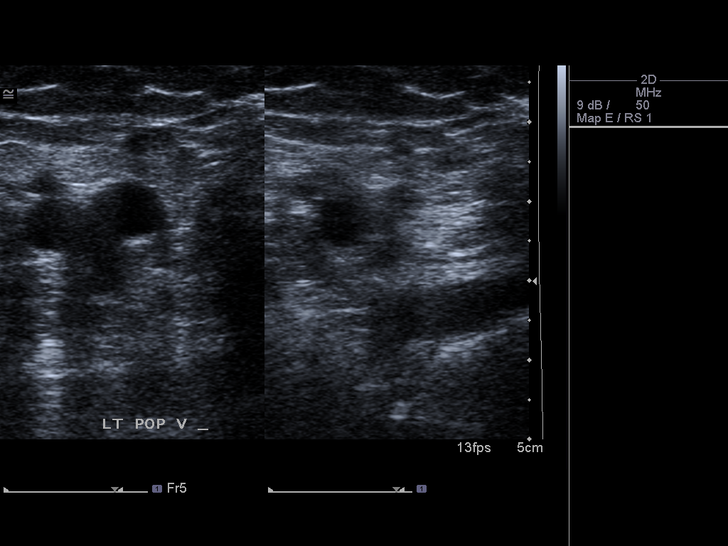
[im 14/21]
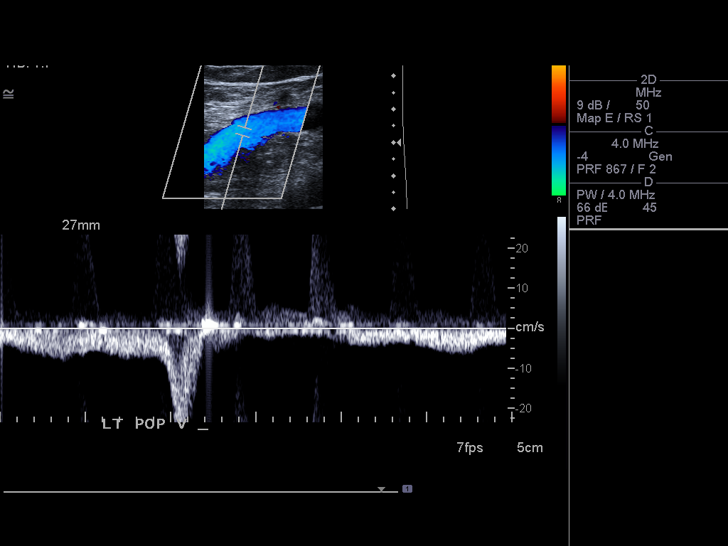
[im 16/21]
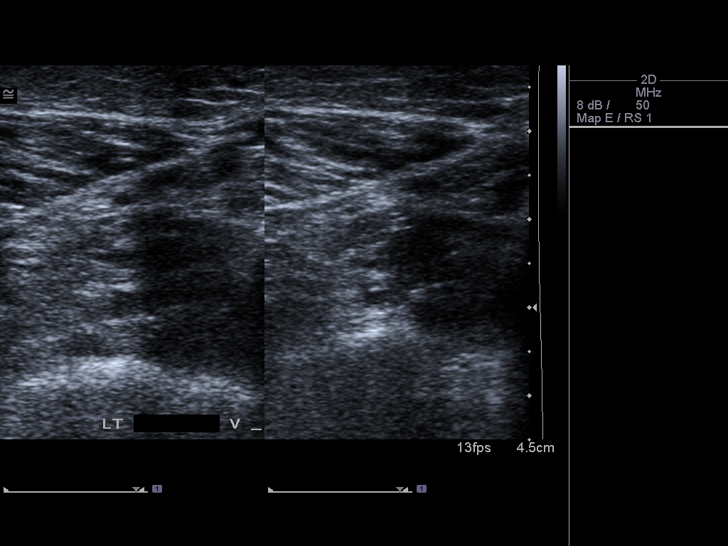
[im 17/21]
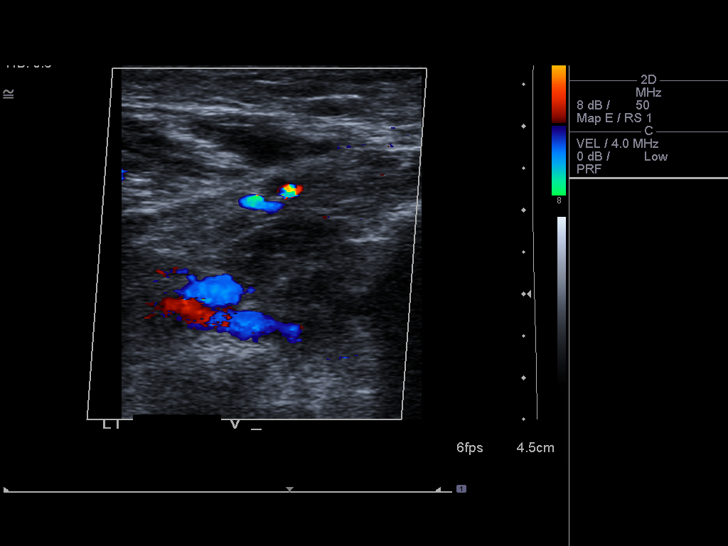
[im 19/21]
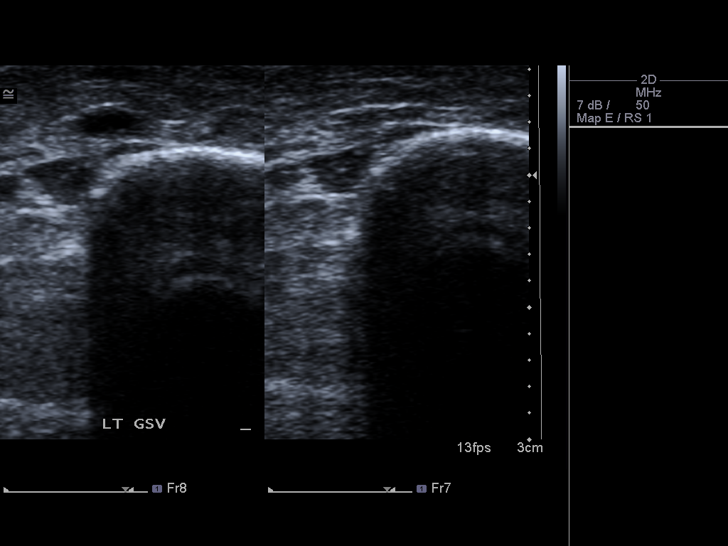
[im 21/21]
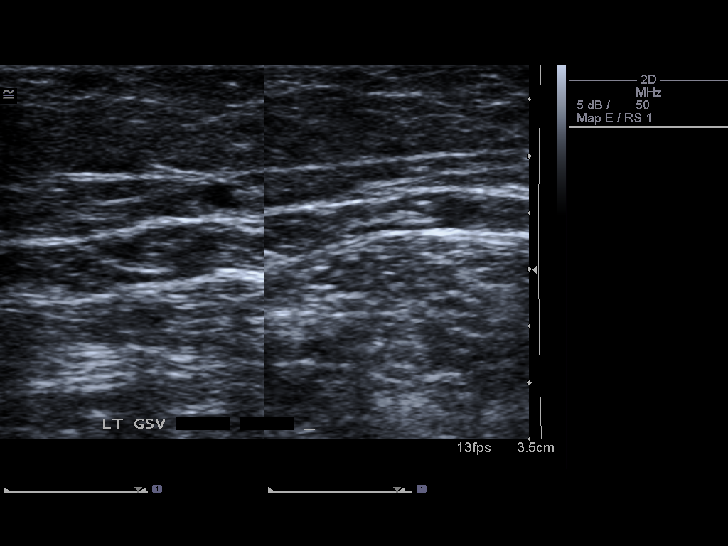

[13 of 21 positions shown; findings below may reference images not displayed]

FINDINGS: Common Femoral Vein: No evidence of thrombus. Normal
compressibility, respiratory phasicity and response to augmentation.

Saphenofemoral Junction: No evidence of thrombus. Normal
compressibility and flow on color Doppler imaging.

Profunda Femoral Vein: No evidence of thrombus. Normal
compressibility and flow on color Doppler imaging.

Femoral Vein: No evidence of thrombus. Normal compressibility,
respiratory phasicity and response to augmentation.

Popliteal Vein: No evidence of thrombus. Normal compressibility,
respiratory phasicity and response to augmentation.

Calf Veins: No evidence of thrombus. Normal compressibility and flow
on color Doppler imaging.

Superficial Great Saphenous Vein: No evidence of thrombus. Normal
compressibility and flow on color Doppler imaging.

Venous Reflux:  None.

Other Findings:  None.
IMPRESSION: No evidence of deep venous thrombosis.

## 2015-02-09 DIAGNOSIS — R278 Other lack of coordination: Secondary | ICD-10-CM | POA: Diagnosis not present

## 2015-02-09 DIAGNOSIS — G0430 Acute necrotizing hemorrhagic encephalopathy, unspecified: Secondary | ICD-10-CM | POA: Diagnosis not present

## 2015-02-09 DIAGNOSIS — M6281 Muscle weakness (generalized): Secondary | ICD-10-CM | POA: Diagnosis not present

## 2015-02-09 DIAGNOSIS — R1312 Dysphagia, oropharyngeal phase: Secondary | ICD-10-CM | POA: Diagnosis not present

## 2015-02-09 DIAGNOSIS — G308 Other Alzheimer's disease: Secondary | ICD-10-CM | POA: Diagnosis not present

## 2015-02-10 DIAGNOSIS — G308 Other Alzheimer's disease: Secondary | ICD-10-CM | POA: Diagnosis not present

## 2015-02-10 DIAGNOSIS — R1312 Dysphagia, oropharyngeal phase: Secondary | ICD-10-CM | POA: Diagnosis not present

## 2015-02-10 DIAGNOSIS — R278 Other lack of coordination: Secondary | ICD-10-CM | POA: Diagnosis not present

## 2015-02-10 DIAGNOSIS — G0430 Acute necrotizing hemorrhagic encephalopathy, unspecified: Secondary | ICD-10-CM | POA: Diagnosis not present

## 2015-02-10 DIAGNOSIS — M6281 Muscle weakness (generalized): Secondary | ICD-10-CM | POA: Diagnosis not present

## 2015-02-11 DIAGNOSIS — G308 Other Alzheimer's disease: Secondary | ICD-10-CM | POA: Diagnosis not present

## 2015-02-11 DIAGNOSIS — G0430 Acute necrotizing hemorrhagic encephalopathy, unspecified: Secondary | ICD-10-CM | POA: Diagnosis not present

## 2015-02-11 DIAGNOSIS — R278 Other lack of coordination: Secondary | ICD-10-CM | POA: Diagnosis not present

## 2015-02-11 DIAGNOSIS — R1312 Dysphagia, oropharyngeal phase: Secondary | ICD-10-CM | POA: Diagnosis not present

## 2015-02-11 DIAGNOSIS — M6281 Muscle weakness (generalized): Secondary | ICD-10-CM | POA: Diagnosis not present

## 2015-02-12 ENCOUNTER — Inpatient Hospital Stay (HOSPITAL_COMMUNITY)
Admission: EM | Admit: 2015-02-12 | Discharge: 2015-02-15 | DRG: 689 | Disposition: A | Discharge status: Home Health | Payer: Commercial Managed Care - HMO | Attending: Internal Medicine | Admitting: Internal Medicine

## 2015-02-12 ENCOUNTER — Emergency Department (HOSPITAL_COMMUNITY): Payer: Commercial Managed Care - HMO

## 2015-02-12 ENCOUNTER — Encounter (HOSPITAL_COMMUNITY): Payer: Self-pay

## 2015-02-12 DIAGNOSIS — R109 Unspecified abdominal pain: Secondary | ICD-10-CM | POA: Diagnosis not present

## 2015-02-12 DIAGNOSIS — G9340 Encephalopathy, unspecified: Secondary | ICD-10-CM | POA: Diagnosis not present

## 2015-02-12 DIAGNOSIS — G92 Toxic encephalopathy: Secondary | ICD-10-CM | POA: Diagnosis not present

## 2015-02-12 DIAGNOSIS — N390 Urinary tract infection, site not specified: Principal | ICD-10-CM | POA: Diagnosis present

## 2015-02-12 DIAGNOSIS — F0391 Unspecified dementia with behavioral disturbance: Secondary | ICD-10-CM | POA: Diagnosis present

## 2015-02-12 DIAGNOSIS — R410 Disorientation, unspecified: Secondary | ICD-10-CM | POA: Diagnosis not present

## 2015-02-12 DIAGNOSIS — R296 Repeated falls: Secondary | ICD-10-CM | POA: Diagnosis present

## 2015-02-12 DIAGNOSIS — I10 Essential (primary) hypertension: Secondary | ICD-10-CM | POA: Diagnosis not present

## 2015-02-12 DIAGNOSIS — Z043 Encounter for examination and observation following other accident: Secondary | ICD-10-CM | POA: Diagnosis not present

## 2015-02-12 DIAGNOSIS — Z86010 Personal history of colonic polyps: Secondary | ICD-10-CM | POA: Diagnosis not present

## 2015-02-12 DIAGNOSIS — R1312 Dysphagia, oropharyngeal phase: Secondary | ICD-10-CM | POA: Diagnosis not present

## 2015-02-12 DIAGNOSIS — F0390 Unspecified dementia without behavioral disturbance: Secondary | ICD-10-CM | POA: Diagnosis present

## 2015-02-12 DIAGNOSIS — I129 Hypertensive chronic kidney disease with stage 1 through stage 4 chronic kidney disease, or unspecified chronic kidney disease: Secondary | ICD-10-CM | POA: Diagnosis not present

## 2015-02-12 DIAGNOSIS — E039 Hypothyroidism, unspecified: Secondary | ICD-10-CM | POA: Diagnosis not present

## 2015-02-12 DIAGNOSIS — T148 Other injury of unspecified body region: Secondary | ICD-10-CM | POA: Diagnosis not present

## 2015-02-12 DIAGNOSIS — M6281 Muscle weakness (generalized): Secondary | ICD-10-CM | POA: Diagnosis not present

## 2015-02-12 DIAGNOSIS — S0003XA Contusion of scalp, initial encounter: Secondary | ICD-10-CM | POA: Diagnosis not present

## 2015-02-12 DIAGNOSIS — W1839XA Other fall on same level, initial encounter: Secondary | ICD-10-CM | POA: Diagnosis not present

## 2015-02-12 DIAGNOSIS — T1490 Injury, unspecified: Secondary | ICD-10-CM | POA: Diagnosis not present

## 2015-02-12 DIAGNOSIS — R011 Cardiac murmur, unspecified: Secondary | ICD-10-CM | POA: Diagnosis not present

## 2015-02-12 DIAGNOSIS — Z66 Do not resuscitate: Secondary | ICD-10-CM | POA: Diagnosis not present

## 2015-02-12 DIAGNOSIS — Z79899 Other long term (current) drug therapy: Secondary | ICD-10-CM | POA: Diagnosis not present

## 2015-02-12 DIAGNOSIS — R4182 Altered mental status, unspecified: Secondary | ICD-10-CM | POA: Diagnosis present

## 2015-02-12 DIAGNOSIS — S064X0A Epidural hemorrhage without loss of consciousness, initial encounter: Secondary | ICD-10-CM | POA: Diagnosis not present

## 2015-02-12 DIAGNOSIS — G0430 Acute necrotizing hemorrhagic encephalopathy, unspecified: Secondary | ICD-10-CM | POA: Diagnosis not present

## 2015-02-12 DIAGNOSIS — W19XXXA Unspecified fall, initial encounter: Secondary | ICD-10-CM | POA: Diagnosis present

## 2015-02-12 DIAGNOSIS — M1990 Unspecified osteoarthritis, unspecified site: Secondary | ICD-10-CM | POA: Diagnosis not present

## 2015-02-12 DIAGNOSIS — E038 Other specified hypothyroidism: Secondary | ICD-10-CM | POA: Diagnosis not present

## 2015-02-12 DIAGNOSIS — Z87440 Personal history of urinary (tract) infections: Secondary | ICD-10-CM | POA: Diagnosis not present

## 2015-02-12 DIAGNOSIS — R7302 Impaired glucose tolerance (oral): Secondary | ICD-10-CM | POA: Diagnosis present

## 2015-02-12 DIAGNOSIS — Z8619 Personal history of other infectious and parasitic diseases: Secondary | ICD-10-CM | POA: Diagnosis not present

## 2015-02-12 DIAGNOSIS — Z87891 Personal history of nicotine dependence: Secondary | ICD-10-CM

## 2015-02-12 DIAGNOSIS — S79912A Unspecified injury of left hip, initial encounter: Secondary | ICD-10-CM | POA: Diagnosis not present

## 2015-02-12 DIAGNOSIS — G9341 Metabolic encephalopathy: Secondary | ICD-10-CM | POA: Diagnosis present

## 2015-02-12 DIAGNOSIS — G308 Other Alzheimer's disease: Secondary | ICD-10-CM | POA: Diagnosis not present

## 2015-02-12 DIAGNOSIS — N189 Chronic kidney disease, unspecified: Secondary | ICD-10-CM | POA: Diagnosis present

## 2015-02-12 DIAGNOSIS — S199XXA Unspecified injury of neck, initial encounter: Secondary | ICD-10-CM | POA: Diagnosis not present

## 2015-02-12 DIAGNOSIS — I38 Endocarditis, valve unspecified: Secondary | ICD-10-CM | POA: Diagnosis present

## 2015-02-12 DIAGNOSIS — Z7982 Long term (current) use of aspirin: Secondary | ICD-10-CM

## 2015-02-12 DIAGNOSIS — Y92129 Unspecified place in nursing home as the place of occurrence of the external cause: Secondary | ICD-10-CM | POA: Diagnosis not present

## 2015-02-12 DIAGNOSIS — Z8701 Personal history of pneumonia (recurrent): Secondary | ICD-10-CM | POA: Diagnosis not present

## 2015-02-12 DIAGNOSIS — R278 Other lack of coordination: Secondary | ICD-10-CM | POA: Diagnosis not present

## 2015-02-12 DIAGNOSIS — R269 Unspecified abnormalities of gait and mobility: Secondary | ICD-10-CM | POA: Diagnosis not present

## 2015-02-12 DIAGNOSIS — Y998 Other external cause status: Secondary | ICD-10-CM | POA: Diagnosis not present

## 2015-02-12 DIAGNOSIS — Y9389 Activity, other specified: Secondary | ICD-10-CM | POA: Diagnosis not present

## 2015-02-12 DIAGNOSIS — S098XXA Other specified injuries of head, initial encounter: Secondary | ICD-10-CM | POA: Diagnosis not present

## 2015-02-12 DIAGNOSIS — S299XXA Unspecified injury of thorax, initial encounter: Secondary | ICD-10-CM | POA: Diagnosis not present

## 2015-02-12 LAB — URINE MICROSCOPIC-ADD ON

## 2015-02-12 LAB — URINALYSIS, ROUTINE W REFLEX MICROSCOPIC
Bilirubin Urine: NEGATIVE
GLUCOSE, UA: NEGATIVE mg/dL
Ketones, ur: NEGATIVE mg/dL
Nitrite: NEGATIVE
PH: 5.5 (ref 5.0–8.0)
Protein, ur: NEGATIVE mg/dL
SPECIFIC GRAVITY, URINE: 1.018 (ref 1.005–1.030)
Urobilinogen, UA: 0.2 mg/dL (ref 0.0–1.0)

## 2015-02-12 LAB — CBC WITH DIFFERENTIAL/PLATELET
Basophils Absolute: 0 10*3/uL (ref 0.0–0.1)
Basophils Relative: 0 % (ref 0–1)
EOS ABS: 0 10*3/uL (ref 0.0–0.7)
EOS PCT: 0 % (ref 0–5)
HCT: 33 % — ABNORMAL LOW (ref 36.0–46.0)
HEMOGLOBIN: 10.9 g/dL — AB (ref 12.0–15.0)
LYMPHS ABS: 1.4 10*3/uL (ref 0.7–4.0)
Lymphocytes Relative: 13 % (ref 12–46)
MCH: 29.3 pg (ref 26.0–34.0)
MCHC: 33 g/dL (ref 30.0–36.0)
MCV: 88.7 fL (ref 78.0–100.0)
MONOS PCT: 5 % (ref 3–12)
Monocytes Absolute: 0.6 10*3/uL (ref 0.1–1.0)
NEUTROS PCT: 82 % — AB (ref 43–77)
Neutro Abs: 9.4 10*3/uL — ABNORMAL HIGH (ref 1.7–7.7)
Platelets: 208 10*3/uL (ref 150–400)
RBC: 3.72 MIL/uL — ABNORMAL LOW (ref 3.87–5.11)
RDW: 13.2 % (ref 11.5–15.5)
WBC: 11.4 10*3/uL — ABNORMAL HIGH (ref 4.0–10.5)

## 2015-02-12 LAB — BASIC METABOLIC PANEL
Anion gap: 7 (ref 5–15)
BUN: 17 mg/dL (ref 6–20)
CHLORIDE: 104 mmol/L (ref 101–111)
CO2: 29 mmol/L (ref 22–32)
CREATININE: 0.96 mg/dL (ref 0.44–1.00)
Calcium: 9 mg/dL (ref 8.9–10.3)
GFR calc Af Amer: 58 mL/min — ABNORMAL LOW (ref >60)
GFR calc non Af Amer: 50 mL/min — ABNORMAL LOW (ref >60)
GLUCOSE: 137 mg/dL — AB (ref 65–99)
Potassium: 4.3 mmol/L (ref 3.5–5.1)
SODIUM: 140 mmol/L (ref 135–145)

## 2015-02-12 LAB — CBG MONITORING, ED: Glucose-Capillary: 122 mg/dL — ABNORMAL HIGH (ref 65–99)

## 2015-02-12 LAB — CK: Total CK: 50 U/L (ref 38–234)

## 2015-02-12 MED ORDER — DEXTROSE 5 % IV SOLN
1.0000 g | Freq: Once | INTRAVENOUS | Status: AC
  Administered 2015-02-13: 1 g via INTRAVENOUS
  Filled 2015-02-12: qty 10

## 2015-02-12 NOTE — ED Notes (Signed)
PA at bedside.

## 2015-02-12 NOTE — ED Notes (Signed)
Pt was at baseline on scene, asking about "the baby", pt has towel around neck to protect cspine

## 2015-02-12 NOTE — ED Provider Notes (Signed)
CSN: [id number]     Arrival date & time 02/12/15  2013 History   First MD Initiated Contact with Patient 02/12/15 2015     Chief Complaint  Patient presents with  . Fall     (Consider location/radiation/quality/duration/timing/severity/associated sxs/prior Treatment) HPI Comments: Per [facility]: Pt is from [facility], memory care - Pt was outside when staff found her on the ground. Unknown if she fell from a standing position or from her wheelchair. Unknown how patient fell or how long she was on the ground for. Per [facility]: Pt did receive "night meds" prior to [facility] being called to the scene. Patient is a level V caveat d/t mental status change.   Patient is a 81 y.o. female presenting with fall.  Fall    Past Medical History  Diagnosis Date  . Arthritis   . Chicken pox   . Chronic kidney disease   . Colon polyps   . Hypertension   . Dementia   . Angina   . Dysrhythmia   . Heart murmur   . Shortness of breath   . Recurrent upper respiratory infection (URI)   . Pneumonia   . Hypothyroidism   . Anemia   . Headache(784.0)   . Anxiety   . Depression   . Valvular heart disease 09/04/2012  . Left leg pain 10/02/2012  . UTI (urinary tract infection) 12/30/2012  . Acute bronchitis 04/10/2013  . Dehydration 08/31/2013  . Arthritis 10/02/2012   Past Surgical History  Procedure Laterality Date  . Appendectomy    . Cholecystectomy    . Kidney stones    . Tonsillectomy    . Abdominal hysterectomy    . Hand surgery    . Feet surgery    . Shoulder arthroscopy    . Back surgery    . Tonsillectomy    . Eye surgery      cataract removed and eye lids lifted  . Fracture surgery      bilateral arms  . Tubal ligation    . Colonoscopy w/ polypectomy     Family History  Problem Relation Age of Onset  . Heart disease Mother   . Heart disease Father   . Heart disease Other   . Birth defects Other    Social History  Substance Use Topics  . Smoking status: Former Smoker -- 0.20  packs/day for 1 years    Types: Cigarettes    Quit date: 06/02/1941  . Smokeless tobacco: Never Used  . Alcohol Use: No   OB History    No data available     Review of Systems  Unable to perform ROS: Mental status change      Allergies  Morphine and related; Codeine; and Hydrocodone  Home Medications   Prior to Admission medications   Medication Sig Start Date End Date Taking? Authorizing Provider  albuterol (PROVENTIL HFA;VENTOLIN HFA) 108 (90 BASE) MCG/ACT inhaler Inhale 2 puffs into the lungs every 6 (six) hours as needed for wheezing or shortness of breath. 09/23/13  Yes Sandford Craze, NP  aspirin 81 MG tablet Take 81 mg by mouth daily.   Yes Historical Provider, MD  clonazePAM (KLONOPIN) 1 MG tablet Take 1 mg by mouth every 4 (four) hours as needed for anxiety.   Yes Historical Provider, MD  levothyroxine (SYNTHROID, LEVOTHROID) 50 MCG tablet TAKE 1 TABLET (50 MCG TOTAL) BY MOUTH DAILY. 09/25/14  Yes Judie Bonus, MD  risperiDONE (RISPERDAL) 1 MG tablet Take 1 mg by mouth at  bedtime.   Yes Historical Provider, MD  traZODone (DESYREL) 50 MG tablet Take 1 tablet (50 mg total) by mouth at bedtime. 01/16/15  Yes Meredeth Ide, MD  clonazePAM (KLONOPIN) 0.5 MG tablet Take 0.5 tablets (0.25 mg total) by mouth 2 (two) times daily. 01/16/15   Meredeth Ide, MD   BP 151/47 mmHg  Pulse 80  Temp(Src) 97.8 F (36.6 C) (Oral)  Resp 17  Wt 170 lb 6.7 oz (77.3 kg)  SpO2 100% Physical Exam  Constitutional: She appears well-developed and well-nourished.  HENT:  Head: Normocephalic.  Right Ear: External ear normal.  Left Ear: External ear normal.  Nose: Nose normal.  Hematoma to posterior scalp. No bleeding.   Eyes: Conjunctivae and EOM are normal. Pupils are equal, round, and reactive to light.  Cardiovascular: Normal rate, regular rhythm and normal heart sounds.   Pulmonary/Chest: Effort normal and breath sounds normal.  Abdominal: Soft. There is no tenderness.   Musculoskeletal: She exhibits no edema.  Neurological:  Gag reflex present. Does not follow commands. Patient very somnolent localizes to sternal rub and vocalizes with pain  Nursing note and vitals reviewed.   ED Course  Procedures (including critical care time) Medications  enoxaparin (LOVENOX) injection 40 mg (not administered)  sodium chloride 0.9 % injection 3 mL (3 mLs Intravenous Given 02/13/15 0247)  0.9 %  sodium chloride infusion ( Intravenous New Bag/Given 02/13/15 0247)  acetaminophen (TYLENOL) tablet 650 mg (not administered)    Or  acetaminophen (TYLENOL) suppository 650 mg (not administered)  ondansetron (ZOFRAN) tablet 4 mg (not administered)    Or  ondansetron (ZOFRAN) injection 4 mg (not administered)  cefTRIAXone (ROCEPHIN) 1 g in dextrose 5 % 50 mL IVPB (0 g Intravenous Stopped 02/13/15 0100)    Labs Review Labs Reviewed  CBC WITH DIFFERENTIAL/PLATELET - Abnormal; Notable for the following:    WBC 11.4 (*)    RBC 3.72 (*)    Hemoglobin 10.9 (*)    HCT 33.0 (*)    Neutrophils Relative % 82 (*)    Neutro Abs 9.4 (*)    All other components within normal limits  BASIC METABOLIC PANEL - Abnormal; Notable for the following:    Glucose, Bld 137 (*)    GFR calc non Af Amer 50 (*)    GFR calc Af Amer 58 (*)    All other components within normal limits  URINALYSIS, ROUTINE W REFLEX MICROSCOPIC (NOT AT [facility]) - Abnormal; Notable for the following:    APPearance CLOUDY (*)    Hgb urine dipstick SMALL (*)    Leukocytes, UA MODERATE (*)    All other components within normal limits  URINE MICROSCOPIC-ADD ON - Abnormal; Notable for the following:    Squamous Epithelial / LPF FEW (*)    Bacteria, UA MANY (*)    Casts HYALINE CASTS (*)    All other components within normal limits  CBG MONITORING, ED - Abnormal; Notable for the following:    Glucose-Capillary 122 (*)    All other components within normal limits  URINE CULTURE  MRSA PCR SCREENING  CK   COMPREHENSIVE METABOLIC PANEL  CBC  PROTIME-INR  I-STAT CG4 LACTIC ACID, ED    Imaging Review Dg Chest 1 View  02/12/2015   CLINICAL DATA:  Patient was found outside on the ground. Patient lives in a memory care unit. Unknown if she fell from a standing position or from her wheelchair. Hematoma on the head.  EXAM: CHEST  1 VIEW  COMPARISON:  02/06/2015  FINDINGS: The heart is mildly enlarged. The lungs are clear. There are no focal consolidations or pleural effusions. There are chronic changes in the shoulders bilaterally. Moderate spondylosis identified in the thoracic spine.  IMPRESSION: Cardiomegaly.  No evidence for acute  abnormality.   Electronically Signed   By: Norva Pavlov M.D.   On: 02/12/2015 21:26   Ct Head Wo Contrast  02/12/2015   CLINICAL DATA:  Unwitnessed fall. Confused patient. Hematoma to the back of the head.  EXAM: CT HEAD WITHOUT CONTRAST  CT CERVICAL SPINE WITHOUT CONTRAST  TECHNIQUE: Multidetector CT imaging of the head and cervical spine was performed following the standard protocol without intravenous contrast. Multiplanar CT image reconstructions of the cervical spine were also generated.  COMPARISON:  01/07/2015  FINDINGS: CT HEAD FINDINGS  Ventricles normal configuration. There is ventricular and sulcal enlargement reflecting moderate generalized atrophy, stable from the prior study. No hydrocephalus.  There are no parenchymal masses or mass effect. Patchy areas of white matter hypoattenuation are appreciated consistent with mild to moderate chronic microvascular ischemic change, stable. There is no evidence of a cortical infarct.  There are no extra-axial masses or abnormal fluid collections.  There is no intracranial hemorrhage.  There is a posterior scalp hematoma. There is no underlying fracture. Visualized sinuses and mastoid air cells are clear.  CT CERVICAL SPINE FINDINGS  No fracture. No spondylolisthesis. Mild loss of disc height at C5-C6 and C6-C7. There are  small endplate osteophytes from C4 through C7. Facet degenerative changes noted bilaterally, most prominent on the left at C4-C5. Soft tissues show carotid vascular calcifications but are otherwise unremarkable. There is mild scarring at the lung apices.  IMPRESSION: HEAD CT: No acute intracranial abnormalities. No skull fracture. Posterior scalp hematoma.  CERVICAL CT:  No fracture or acute finding.   Electronically Signed   By: Amie Portland M.D.   On: 02/12/2015 21:13   Ct Cervical Spine Wo Contrast  02/12/2015   CLINICAL DATA:  Unwitnessed fall. Confused patient. Hematoma to the back of the head.  EXAM: CT HEAD WITHOUT CONTRAST  CT CERVICAL SPINE WITHOUT CONTRAST  TECHNIQUE: Multidetector CT imaging of the head and cervical spine was performed following the standard protocol without intravenous contrast. Multiplanar CT image reconstructions of the cervical spine were also generated.  COMPARISON:  01/07/2015  FINDINGS: CT HEAD FINDINGS  Ventricles normal configuration. There is ventricular and sulcal enlargement reflecting moderate generalized atrophy, stable from the prior study. No hydrocephalus.  There are no parenchymal masses or mass effect. Patchy areas of white matter hypoattenuation are appreciated consistent with mild to moderate chronic microvascular ischemic change, stable. There is no evidence of a cortical infarct.  There are no extra-axial masses or abnormal fluid collections.  There is no intracranial hemorrhage.  There is a posterior scalp hematoma. There is no underlying fracture. Visualized sinuses and mastoid air cells are clear.  CT CERVICAL SPINE FINDINGS  No fracture. No spondylolisthesis. Mild loss of disc height at C5-C6 and C6-C7. There are small endplate osteophytes from C4 through C7. Facet degenerative changes noted bilaterally, most prominent on the left at C4-C5. Soft tissues show carotid vascular calcifications but are otherwise unremarkable. There is mild scarring at the lung  apices.  IMPRESSION: HEAD CT: No acute intracranial abnormalities. No skull fracture. Posterior scalp hematoma.  CERVICAL CT:  No fracture or acute finding.   Electronically Signed   By: Amie Portland M.D.   On: 02/12/2015 21:13   Dg Hip  Unilat With Pelvis 2-3 Views Left  02/12/2015   CLINICAL DATA:  Pt is from [facility], memory care - Pt was outside when staff found her on the ground. Unknown if she fell from a standing position or from her wheelchair. Pupils equal and reactive. EKG showed right bundle branch block and 1st degree. Hematoma on head, no laceration or bleeding.  EXAM: DG HIP (WITH OR WITHOUT PELVIS) 2-3V LEFT  COMPARISON:  None.  FINDINGS: No acute fracture. Hip joint is normally aligned. There is bony productive change along the acetabular margins bilaterally, mostly superiorly, with mild hip joint space narrowing superior laterally.  SI joints and symphysis pubis are normally spaced and aligned.  Bones are demineralized.  Soft tissues are unremarkable.  IMPRESSION: No acute fracture or dislocation.   Electronically Signed   By: Amie Portland M.D.   On: 02/12/2015 21:25   I have personally reviewed and evaluated these images and lab results as part of my medical decision-making.   EKG Interpretation None      On re-evaluation after return from CT scan patient is more responsive and talking, but seems confused. Able to follow commands. Denies any pain.   MDM   Final diagnoses:  Fall    Filed Vitals:   02/13/15 0224  BP: 151/47  Pulse: 80  Temp: 97.8 F (36.6 C)  Resp: 17   Patient presenting to ED from [facility] after an unwitnessed fall. Patient is somnolent on arrival. Hematoma appreciated to posterior scalp otherwise no evidence of trauma. CT head, neck obtained to ensure no head trauma or cervical injury. CXR and pelvis x-ray obtained without acute abnormality. Labs unremarkable. UA reviews UTI. Patient more alert on re-examination. Nursing home  contacted, do not believe patient has been more confused previously from baseline. Rocephin given for UTI. Will admit patient for observation for UTI and possible AMS. Patient d/w with Dr. Dalene Seltzer, agrees with plan.      Francee Piccolo, PA-C 02/13/15 2130  Alvira Monday, MD 02/15/15 [phone]

## 2015-02-12 NOTE — ED Notes (Signed)
Per [facility]: Pt is from [facility], memory care - Pt was outside when staff found her on the ground. Unknown if she fell from a standing position or from her wheelchair. Pupils equal and reactive. EKG showed right bundle branch block and 1st degree. Hematoma on head, no laceration or bleeding.

## 2015-02-12 NOTE — ED Notes (Signed)
Per [facility]: Pt did receive "night meds" prior to [facility] being called to the scene.

## 2015-02-12 NOTE — ED Notes (Signed)
PTs CBG 122

## 2015-02-12 NOTE — ED Notes (Signed)
Pt has a hematoma to the back of her head. No bleeding or laceration noted.

## 2015-02-13 DIAGNOSIS — Z043 Encounter for examination and observation following other accident: Secondary | ICD-10-CM | POA: Diagnosis present

## 2015-02-13 DIAGNOSIS — F0390 Unspecified dementia without behavioral disturbance: Secondary | ICD-10-CM | POA: Diagnosis not present

## 2015-02-13 DIAGNOSIS — Z66 Do not resuscitate: Secondary | ICD-10-CM | POA: Diagnosis present

## 2015-02-13 DIAGNOSIS — Z7982 Long term (current) use of aspirin: Secondary | ICD-10-CM | POA: Diagnosis not present

## 2015-02-13 DIAGNOSIS — Z86010 Personal history of colonic polyps: Secondary | ICD-10-CM | POA: Diagnosis not present

## 2015-02-13 DIAGNOSIS — W19XXXA Unspecified fall, initial encounter: Secondary | ICD-10-CM | POA: Diagnosis present

## 2015-02-13 DIAGNOSIS — Z8701 Personal history of pneumonia (recurrent): Secondary | ICD-10-CM | POA: Diagnosis not present

## 2015-02-13 DIAGNOSIS — R269 Unspecified abnormalities of gait and mobility: Secondary | ICD-10-CM | POA: Diagnosis present

## 2015-02-13 DIAGNOSIS — N390 Urinary tract infection, site not specified: Secondary | ICD-10-CM | POA: Diagnosis present

## 2015-02-13 DIAGNOSIS — R7302 Impaired glucose tolerance (oral): Secondary | ICD-10-CM | POA: Diagnosis present

## 2015-02-13 DIAGNOSIS — N189 Chronic kidney disease, unspecified: Secondary | ICD-10-CM | POA: Diagnosis present

## 2015-02-13 DIAGNOSIS — M1990 Unspecified osteoarthritis, unspecified site: Secondary | ICD-10-CM | POA: Diagnosis not present

## 2015-02-13 DIAGNOSIS — Z8619 Personal history of other infectious and parasitic diseases: Secondary | ICD-10-CM | POA: Diagnosis not present

## 2015-02-13 DIAGNOSIS — Y998 Other external cause status: Secondary | ICD-10-CM | POA: Diagnosis not present

## 2015-02-13 DIAGNOSIS — E038 Other specified hypothyroidism: Secondary | ICD-10-CM | POA: Diagnosis not present

## 2015-02-13 DIAGNOSIS — G92 Toxic encephalopathy: Secondary | ICD-10-CM | POA: Diagnosis present

## 2015-02-13 DIAGNOSIS — W1839XA Other fall on same level, initial encounter: Secondary | ICD-10-CM | POA: Diagnosis not present

## 2015-02-13 DIAGNOSIS — T1490 Injury, unspecified: Secondary | ICD-10-CM | POA: Diagnosis not present

## 2015-02-13 DIAGNOSIS — R410 Disorientation, unspecified: Secondary | ICD-10-CM | POA: Diagnosis not present

## 2015-02-13 DIAGNOSIS — R4182 Altered mental status, unspecified: Secondary | ICD-10-CM | POA: Diagnosis present

## 2015-02-13 DIAGNOSIS — I38 Endocarditis, valve unspecified: Secondary | ICD-10-CM

## 2015-02-13 DIAGNOSIS — E039 Hypothyroidism, unspecified: Secondary | ICD-10-CM | POA: Diagnosis present

## 2015-02-13 DIAGNOSIS — Z87891 Personal history of nicotine dependence: Secondary | ICD-10-CM | POA: Diagnosis not present

## 2015-02-13 DIAGNOSIS — Y9389 Activity, other specified: Secondary | ICD-10-CM | POA: Diagnosis not present

## 2015-02-13 DIAGNOSIS — F0391 Unspecified dementia with behavioral disturbance: Secondary | ICD-10-CM | POA: Diagnosis present

## 2015-02-13 DIAGNOSIS — R011 Cardiac murmur, unspecified: Secondary | ICD-10-CM | POA: Diagnosis not present

## 2015-02-13 DIAGNOSIS — T148 Other injury of unspecified body region: Secondary | ICD-10-CM | POA: Diagnosis not present

## 2015-02-13 DIAGNOSIS — G0430 Acute necrotizing hemorrhagic encephalopathy, unspecified: Secondary | ICD-10-CM | POA: Diagnosis not present

## 2015-02-13 DIAGNOSIS — I10 Essential (primary) hypertension: Secondary | ICD-10-CM | POA: Diagnosis not present

## 2015-02-13 DIAGNOSIS — Z87440 Personal history of urinary (tract) infections: Secondary | ICD-10-CM | POA: Diagnosis not present

## 2015-02-13 DIAGNOSIS — Y92129 Unspecified place in nursing home as the place of occurrence of the external cause: Secondary | ICD-10-CM | POA: Diagnosis not present

## 2015-02-13 DIAGNOSIS — I129 Hypertensive chronic kidney disease with stage 1 through stage 4 chronic kidney disease, or unspecified chronic kidney disease: Secondary | ICD-10-CM | POA: Diagnosis present

## 2015-02-13 DIAGNOSIS — R296 Repeated falls: Secondary | ICD-10-CM | POA: Diagnosis present

## 2015-02-13 DIAGNOSIS — G9340 Encephalopathy, unspecified: Secondary | ICD-10-CM | POA: Diagnosis not present

## 2015-02-13 DIAGNOSIS — Z79899 Other long term (current) drug therapy: Secondary | ICD-10-CM | POA: Diagnosis not present

## 2015-02-13 LAB — CBC
HCT: 34 % — ABNORMAL LOW (ref 36.0–46.0)
HEMOGLOBIN: 11.1 g/dL — AB (ref 12.0–15.0)
MCH: 29.3 pg (ref 26.0–34.0)
MCHC: 32.6 g/dL (ref 30.0–36.0)
MCV: 89.7 fL (ref 78.0–100.0)
PLATELETS: 219 10*3/uL (ref 150–400)
RBC: 3.79 MIL/uL — ABNORMAL LOW (ref 3.87–5.11)
RDW: 13.3 % (ref 11.5–15.5)
WBC: 10.9 10*3/uL — ABNORMAL HIGH (ref 4.0–10.5)

## 2015-02-13 LAB — COMPREHENSIVE METABOLIC PANEL
ALBUMIN: 3.1 g/dL — AB (ref 3.5–5.0)
ALK PHOS: 73 U/L (ref 38–126)
ALT: 16 U/L (ref 14–54)
AST: 18 U/L (ref 15–41)
Anion gap: 7 (ref 5–15)
BUN: 17 mg/dL (ref 6–20)
CALCIUM: 9.1 mg/dL (ref 8.9–10.3)
CHLORIDE: 105 mmol/L (ref 101–111)
CO2: 28 mmol/L (ref 22–32)
CREATININE: 0.88 mg/dL (ref 0.44–1.00)
GFR calc Af Amer: >60 mL/min (ref >60)
GFR calc non Af Amer: 55 mL/min — ABNORMAL LOW (ref >60)
GLUCOSE: 112 mg/dL — AB (ref 65–99)
Potassium: 4.1 mmol/L (ref 3.5–5.1)
SODIUM: 140 mmol/L (ref 135–145)
Total Bilirubin: 0.5 mg/dL (ref 0.3–1.2)
Total Protein: 5.9 g/dL — ABNORMAL LOW (ref 6.5–8.1)

## 2015-02-13 LAB — PROTIME-INR
INR: 1.01 (ref 0.00–1.49)
PROTHROMBIN TIME: 13.5 s (ref 11.6–15.2)

## 2015-02-13 LAB — AMMONIA: AMMONIA: 27 umol/L (ref 9–35)

## 2015-02-13 LAB — FOLATE: Folate: 8.1 ng/mL (ref >5.9)

## 2015-02-13 LAB — SEDIMENTATION RATE: Sed Rate: 17 mm/hr (ref 0–22)

## 2015-02-13 LAB — VITAMIN B12: VITAMIN B 12: 507 pg/mL (ref 180–914)

## 2015-02-13 LAB — MRSA PCR SCREENING: MRSA by PCR: NEGATIVE

## 2015-02-13 LAB — I-STAT CG4 LACTIC ACID, ED: LACTIC ACID, VENOUS: 1.28 mmol/L (ref 0.5–2.0)

## 2015-02-13 LAB — TSH: TSH: 3.722 u[IU]/mL (ref 0.350–4.500)

## 2015-02-13 MED ORDER — LEVOTHYROXINE SODIUM 100 MCG IV SOLR
25.0000 ug | Freq: Every day | INTRAVENOUS | Status: DC
  Administered 2015-02-13 – 2015-02-14 (×2): 25 ug via INTRAVENOUS
  Filled 2015-02-13 (×3): qty 5

## 2015-02-13 MED ORDER — ONDANSETRON HCL 4 MG PO TABS: 4.0000 mg | ORAL_TABLET | Freq: Four times a day (QID) | ORAL | Status: DC | PRN

## 2015-02-13 MED ORDER — CEFTRIAXONE SODIUM 1 G IJ SOLR
1.0000 g | INTRAMUSCULAR | Status: DC
  Administered 2015-02-13 – 2015-02-14 (×2): 1 g via INTRAVENOUS
  Filled 2015-02-13 (×2): qty 10

## 2015-02-13 MED ORDER — ACETAMINOPHEN 650 MG RE SUPP: 650.0000 mg | Freq: Four times a day (QID) | RECTAL | Status: DC | PRN

## 2015-02-13 MED ORDER — ENOXAPARIN SODIUM 40 MG/0.4ML ~~LOC~~ SOLN
40.0000 mg | SUBCUTANEOUS | Status: DC
  Administered 2015-02-13 – 2015-02-15 (×3): 40 mg via SUBCUTANEOUS
  Filled 2015-02-13 (×3): qty 0.4

## 2015-02-13 MED ORDER — HYDRALAZINE HCL 20 MG/ML IJ SOLN: 10.0000 mg | Freq: Four times a day (QID) | INTRAMUSCULAR | Status: DC | PRN

## 2015-02-13 MED ORDER — ONDANSETRON HCL 4 MG/2ML IJ SOLN: 4.0000 mg | Freq: Four times a day (QID) | INTRAMUSCULAR | Status: DC | PRN

## 2015-02-13 MED ORDER — SODIUM CHLORIDE 0.9 % IJ SOLN
3.0000 mL | Freq: Two times a day (BID) | INTRAMUSCULAR | Status: DC
  Administered 2015-02-13: 3 mL via INTRAVENOUS

## 2015-02-13 MED ORDER — ACETAMINOPHEN 325 MG PO TABS: 650.0000 mg | ORAL_TABLET | Freq: Four times a day (QID) | ORAL | Status: DC | PRN

## 2015-02-13 MED ORDER — SODIUM CHLORIDE 0.9 % IV SOLN
INTRAVENOUS | Status: DC
  Administered 2015-02-13 – 2015-02-14 (×3): via INTRAVENOUS

## 2015-02-13 MED ORDER — DEXTROSE 5 % IV SOLN: 1.0000 g | Freq: Once | INTRAVENOUS | Status: DC

## 2015-02-13 NOTE — H&P (Signed)
[facility] History and Physical  Patient: [name]  MRN: [id number]  DOB: [birth year]  DOS: the patient was seen and examined on 02/13/2015 PCP: No primary care provider on file.  Referring physician: Dr.Schlossman Chief Complaint: Fall  HPI: [name] is a 81 y.o. female with Past medical history of dementia, hypertension, chronic kidney disease, recurrent fall, recurrent admissions for encephalopathy from polypharmacy. The patient is presenting from [facility] memory care unit. As per my discussion with the personnel there the patient had a fall and therefore was brought to the hospital. They have not sent patient's MAR review what medication the patient is taking in the facility currently. Evidently the patient was given all her medications prior to her arrival. Patient was found on the ground and since the fall was not witnessed. There was no loss of control of bowel or bladder. There was no tonic clonic movement. At the time of my evaluation the patient was not following commands appropriately all the time. Reportedly when the patient arrived her GCS was significantly low. There was no nausea or vomiting no diarrhea. Recent admission the patient was admitted with similar situation and at which time initially she was placed on comfort care but later on once her mental status improved with as the medication well off she was discharged back to the nursing home with significant changes in her medication list..  The patient is coming from ALF.  At her baseline ambulates with a walker And is dependent for most of her ADL does not manages her medication on her own.  Review of Systems: as mentioned in the history of present illness.  A comprehensive review of the other systems is negative.  Past Medical History  Diagnosis Date  . Arthritis   . Chicken pox   . Chronic kidney disease   . Colon polyps   . Hypertension   . Dementia   . Angina   . Dysrhythmia   . Heart  murmur   . Shortness of breath   . Recurrent upper respiratory infection (URI)   . Pneumonia   . Hypothyroidism   . Anemia   . Headache(784.0)   . Anxiety   . Depression   . Valvular heart disease 09/04/2012  . Left leg pain 10/02/2012  . UTI (urinary tract infection) 12/30/2012  . Acute bronchitis 04/10/2013  . Dehydration 08/31/2013  . Arthritis 10/02/2012   Past Surgical History  Procedure Laterality Date  . Appendectomy    . Cholecystectomy    . Kidney stones    . Tonsillectomy    . Abdominal hysterectomy    . Hand surgery    . Feet surgery    . Shoulder arthroscopy    . Back surgery    . Tonsillectomy    . Eye surgery      cataract removed and eye lids lifted  . Fracture surgery      bilateral arms  . Tubal ligation    . Colonoscopy w/ polypectomy     Social History:  reports that she quit smoking about 73 years ago. Her smoking use included Cigarettes. She has a .2 pack-year smoking history. She has never used smokeless tobacco. She reports that she does not drink alcohol or use illicit drugs.  Allergies  Allergen Reactions  . Morphine And Related     Not noted on MAR  . Codeine Rash  . Hydrocodone Rash    Family History  Problem Relation Age of Onset  . Heart disease Mother   .  Heart disease Father   . Heart disease Other   . Birth defects Other     Prior to Admission medications   Medication Sig Start Date End Date Taking? Authorizing Provider  albuterol (PROVENTIL HFA;VENTOLIN HFA) 108 (90 BASE) MCG/ACT inhaler Inhale 2 puffs into the lungs every 6 (six) hours as needed for wheezing or shortness of breath. 09/23/13  Yes Sandford Craze, NP  aspirin 81 MG tablet Take 81 mg by mouth daily.   Yes Historical Provider, MD  clonazePAM (KLONOPIN) 1 MG tablet Take 1 mg by mouth every 4 (four) hours as needed for anxiety.   Yes Historical Provider, MD  levothyroxine (SYNTHROID, LEVOTHROID) 50 MCG tablet TAKE 1 TABLET (50 MCG TOTAL) BY MOUTH DAILY. 09/25/14  Yes  Judie Bonus, MD  risperiDONE (RISPERDAL) 1 MG tablet Take 1 mg by mouth at bedtime.   Yes Historical Provider, MD  traZODone (DESYREL) 50 MG tablet Take 1 tablet (50 mg total) by mouth at bedtime. 01/16/15  Yes Meredeth Ide, MD  clonazePAM (KLONOPIN) 0.5 MG tablet Take 0.5 tablets (0.25 mg total) by mouth 2 (two) times daily. 01/16/15   Meredeth Ide, MD    Physical Exam: Filed Vitals:   02/13/15 0115 02/13/15 0130 02/13/15 0145 02/13/15 0224  BP: 153/81 147/56 155/53 151/47  Pulse: 84 72 77 80  Temp:    97.8 F (36.6 C)  TempSrc:    Oral  Resp:  22 14 17   Weight:    77.3 kg (170 lb 6.7 oz)  SpO2: 98% 97% 98% 100%    General: Drowsy, withdraws to painful stimuli, occasionally follows command, Appear in mild distress Eyes: PERRL ENT: Oral Mucosa clear moist Neck: no JVD Cardiovascular: S1 and S2 Present, no Murmur, Peripheral Pulses Present Respiratory: Bilateral Air entry equal and Decreased,  Clear to Auscultation, no Crackles, no wheezes Abdomen: Bowel Sound present, Soft and no tenderness Skin: no Rash Extremities: no Pedal edema, no calf tenderness Neurologic: Grossly no focal neuro deficit.  Labs on Admission:  CBC:  Recent Labs Lab 02/06/15 1007 02/12/15 2217 02/13/15 0300  WBC 8.0 11.4* 10.9*  NEUTROABS 5.6 9.4*  --   HGB 11.5* 10.9* 11.1*  HCT 35.3* 33.0* 34.0*  MCV 90.5 88.7 89.7  PLT 236 208 219    CMP     Component Value Date/Time   NA 140 02/13/2015 0300   K 4.1 02/13/2015 0300   CL 105 02/13/2015 0300   CO2 28 02/13/2015 0300   GLUCOSE 112* 02/13/2015 0300   BUN 17 02/13/2015 0300   CREATININE 0.88 02/13/2015 0300   CREATININE 0.90 01/26/2014 0827   CALCIUM 9.1 02/13/2015 0300   PROT 5.9* 02/13/2015 0300   ALBUMIN 3.1* 02/13/2015 0300   AST 18 02/13/2015 0300   ALT 16 02/13/2015 0300   ALKPHOS 73 02/13/2015 0300   BILITOT 0.5 02/13/2015 0300   GFRNONAA 55* 02/13/2015 0300   GFRAA >60 02/13/2015 0300    No results for input(s):  LIPASE, AMYLASE in the last 168 hours.   Recent Labs Lab 02/12/15 2217  CKTOTAL 50   BNP (last 3 results) No results for input(s): BNP in the last 8760 hours.  ProBNP (last 3 results) No results for input(s): PROBNP in the last 8760 hours.   Radiological Exams on Admission: Dg Chest 1 View  02/12/2015   CLINICAL DATA:  Patient was found outside on the ground. Patient lives in a memory care unit. Unknown if she fell from a standing position or  from her wheelchair. Hematoma on the head.  EXAM: CHEST  1 VIEW  COMPARISON:  02/06/2015  FINDINGS: The heart is mildly enlarged. The lungs are clear. There are no focal consolidations or pleural effusions. There are chronic changes in the shoulders bilaterally. Moderate spondylosis identified in the thoracic spine.  IMPRESSION: Cardiomegaly.  No evidence for acute  abnormality.   Electronically Signed   By: Norva Pavlov M.D.   On: 02/12/2015 21:26   Ct Head Wo Contrast  02/12/2015   CLINICAL DATA:  Unwitnessed fall. Confused patient. Hematoma to the back of the head.  EXAM: CT HEAD WITHOUT CONTRAST  CT CERVICAL SPINE WITHOUT CONTRAST  TECHNIQUE: Multidetector CT imaging of the head and cervical spine was performed following the standard protocol without intravenous contrast. Multiplanar CT image reconstructions of the cervical spine were also generated.  COMPARISON:  01/07/2015  FINDINGS: CT HEAD FINDINGS  Ventricles normal configuration. There is ventricular and sulcal enlargement reflecting moderate generalized atrophy, stable from the prior study. No hydrocephalus.  There are no parenchymal masses or mass effect. Patchy areas of white matter hypoattenuation are appreciated consistent with mild to moderate chronic microvascular ischemic change, stable. There is no evidence of a cortical infarct.  There are no extra-axial masses or abnormal fluid collections.  There is no intracranial hemorrhage.  There is a posterior scalp hematoma. There is no  underlying fracture. Visualized sinuses and mastoid air cells are clear.  CT CERVICAL SPINE FINDINGS  No fracture. No spondylolisthesis. Mild loss of disc height at C5-C6 and C6-C7. There are small endplate osteophytes from C4 through C7. Facet degenerative changes noted bilaterally, most prominent on the left at C4-C5. Soft tissues show carotid vascular calcifications but are otherwise unremarkable. There is mild scarring at the lung apices.  IMPRESSION: HEAD CT: No acute intracranial abnormalities. No skull fracture. Posterior scalp hematoma.  CERVICAL CT:  No fracture or acute finding.   Electronically Signed   By: Amie Portland M.D.   On: 02/12/2015 21:13   Ct Cervical Spine Wo Contrast  02/12/2015   CLINICAL DATA:  Unwitnessed fall. Confused patient. Hematoma to the back of the head.  EXAM: CT HEAD WITHOUT CONTRAST  CT CERVICAL SPINE WITHOUT CONTRAST  TECHNIQUE: Multidetector CT imaging of the head and cervical spine was performed following the standard protocol without intravenous contrast. Multiplanar CT image reconstructions of the cervical spine were also generated.  COMPARISON:  01/07/2015  FINDINGS: CT HEAD FINDINGS  Ventricles normal configuration. There is ventricular and sulcal enlargement reflecting moderate generalized atrophy, stable from the prior study. No hydrocephalus.  There are no parenchymal masses or mass effect. Patchy areas of white matter hypoattenuation are appreciated consistent with mild to moderate chronic microvascular ischemic change, stable. There is no evidence of a cortical infarct.  There are no extra-axial masses or abnormal fluid collections.  There is no intracranial hemorrhage.  There is a posterior scalp hematoma. There is no underlying fracture. Visualized sinuses and mastoid air cells are clear.  CT CERVICAL SPINE FINDINGS  No fracture. No spondylolisthesis. Mild loss of disc height at C5-C6 and C6-C7. There are small endplate osteophytes from C4 through C7. Facet  degenerative changes noted bilaterally, most prominent on the left at C4-C5. Soft tissues show carotid vascular calcifications but are otherwise unremarkable. There is mild scarring at the lung apices.  IMPRESSION: HEAD CT: No acute intracranial abnormalities. No skull fracture. Posterior scalp hematoma.  CERVICAL CT:  No fracture or acute finding.   Electronically Signed  By: Amie Portland M.D.   On: 02/12/2015 21:13   Dg Hip Unilat With Pelvis 2-3 Views Left  02/12/2015   CLINICAL DATA:  Pt is from [facility], memory care - Pt was outside when staff found her on the ground. Unknown if she fell from a standing position or from her wheelchair. Pupils equal and reactive. EKG showed right bundle branch block and 1st degree. Hematoma on head, no laceration or bleeding.  EXAM: DG HIP (WITH OR WITHOUT PELVIS) 2-3V LEFT  COMPARISON:  None.  FINDINGS: No acute fracture. Hip joint is normally aligned. There is bony productive change along the acetabular margins bilaterally, mostly superiorly, with mild hip joint space narrowing superior laterally.  SI joints and symphysis pubis are normally spaced and aligned.  Bones are demineralized.  Soft tissues are unremarkable.  IMPRESSION: No acute fracture or dislocation.   Electronically Signed   By: Amie Portland M.D.   On: 02/12/2015 21:25   EKG: Independently reviewed. normal sinus rhythm, nonspecific ST and T waves changes.  Assessment/Plan Principal Problem:   Acute encephalopathy Active Problems:   Hypertension   Hypothyroidism   Dementia   Valvular heart disease   Recurrent falls   1. Acute encephalopathy The patient is presenting with complaints of a fall. The fall was not witnessed. Next and the patient is currently significantly drowsy. Although he is maintaining airway as well as maintaining oxygenation. No hemodynamically stable. Most likely this is recurrent fall as well as a recurrent presentations with again difficulty is secondary from  polypharmacy. May need to request the nursing home facility to use cyclic tropic medication very judiciously. Currently I would hold off on all of them, although the patient has not received any MAR from the facility as of yet. I have requested them to send me a fax copy. Imaging is unremarkable. There is a possible evidence of UTI which will be treated with ceftriaxone although recent culture was negative.  2. History of hypertension. Currently holding blood pressure medication.  3. History of dementia. The patient has history of dementia and also has behavioral disturbances requiring multiple second tropic medications but given her current presentation with hold off on them.  4. Possible UTI. Treatment sig ceftriaxone.  5.Recurrent fall. Most likely polypharmacy as well as a possibility of UTI causing were further dementia and delirium.  Advance goals of care discussion: DNR/DNI  DVT Prophylaxis: subcutaneous Heparin Nutrition: Nothing by mouth  Disposition: Admitted as observation, telemetry unit.  Author: Lynden Oxford, MD [facility] Pager: [phone] 02/13/2015  If 7PM-7AM, please contact night-coverage [email or web address] Password [email or web address]

## 2015-02-13 NOTE — Progress Notes (Signed)
[facility]                                                                              Patient Demographics  [name], is a 81 y.o. female, DOB - 1920/09/19, UXL:[id number]  Admit date - 02/12/2015   Admitting Physician Rolly Salter, MD  Outpatient Primary MD for the patient is No primary care provider on file.  LOS -    Chief Complaint  Patient presents with  . Fall       Brief HPI   Per Dr. Allena Katz on 9/13 [name] is a 81 y.o. female with Past medical history of dementia, hypertension, chronic kidney disease, recurrent fall, recurrent admissions for encephalopathy from polypharmacy. Patient presented from [facility] memory care unit.  As per my discussion with the personnel there the patient had a fall and therefore was brought to the hospital. Patient was found on the ground and since the fall was not witnessed. There was no loss of control of bowel or bladder. There was no tonic clonic movement. Patient was not following any commands appropriately at the time of admitting physician's evaluation. Reportedly when the patient arrived her GCS was significantly low. There was no nausea or vomiting no diarrhea. Recent admission the patient was admitted with similar situation and at which time initially she was placed on comfort care but later on once her mental status improved, she was discharged back to the nursing home with significant changes in her medication list.   Assessment & Plan    Principal Problem:   Acute encephalopathy superimposed on dementia, likely worsened due to UTI - Patient presented with unwitnessed fall, still significantly somnolent although hemodynamically stable - There is a possibility of polypharmacy as well - Follow urine culture, placed on IV Rocephin - Obtain TSH, B12, folate, RPR, ammonia level - CT head negative for any CVA or intracranial pathology   Active Problems: UTI (urinary tract infection) - Follow urine  culture and sensitivities, continue IV Rocephin    Hypertension - Currently stable, place on hydralazine as needed with parameters    Hypothyroidism -Obtain TSH, continue IV Synthroid at half dose     Dementia - Continue to hold all medications until more alert and awake, may give 1 dose of Narcan if does not improve. Apparently patient had received all the night meds at the facility prior to the EMS was called. - npo until alert and awake, continue gentle hydration   Recurrent falls - PTOT evaluation once alert and awake   Code Status: DO NOT RESUSCITATE   Family Communication: NO FAMILY  member at the bedside   Disposition Plan:  snf when ready  Time Spent in minutes   25 minutes  Procedures  CT head, C spine  Consults   None   DVT Prophylaxis   Lovenox   Medications  Scheduled Meds: . [START ON 02/14/2015] cefTRIAXone (ROCEPHIN)  IV  1 g Intravenous Once  . enoxaparin (LOVENOX) injection  40 mg Subcutaneous Q24H  . sodium chloride  3 mL Intravenous Q12H   Continuous Infusions: . sodium chloride 50 mL/hr at 02/13/15 0247   PRN  Meds:.acetaminophen **OR** acetaminophen, ondansetron **OR** ondansetron (ZOFRAN) IV   Antibiotics   Anti-infectives    Start     Dose/Rate Route Frequency Ordered Stop   02/14/15 0600  cefTRIAXone (ROCEPHIN) 1 g in dextrose 5 % 50 mL IVPB     1 g 100 mL/hr over 30 Minutes Intravenous  Once 02/13/15 0547     02/13/15 0000  cefTRIAXone (ROCEPHIN) 1 g in dextrose 5 % 50 mL IVPB     1 g 100 mL/hr over 30 Minutes Intravenous  Once 02/12/15 2348 02/13/15 0100        Subjective:   [name] was seen and examined today.  Very somnolent, unable to obtain any review of system, per RN, no acute issues overnight. Afebrile not in any obvious pain.    Objective:   Blood pressure 147/67, pulse 69, temperature 97.6 F (36.4 C), temperature source Oral, resp. rate 20, weight 77.3 kg (170 lb 6.7 oz), SpO2 97 %.  Wt Readings from Last 3  Encounters:  02/13/15 77.3 kg (170 lb 6.7 oz)  01/15/15 78.1 kg (172 lb 2.9 oz)  11/28/14 79.6 kg (175 lb 7.8 oz)     Intake/Output Summary (Last 24 hours) at 02/13/15 1035 Last data filed at 02/13/15 0951  Gross per 24 hour  Intake      0 ml  Output      0 ml  Net      0 ml    Exam  General:Somnolent, lethargic, not following any commands   HEENT:  PERRLA, EOMI, Anicteric Sclera, mucous membranes moist.   Neck: Supple, no JVD, no masses  CVS: S1 S2 auscultated, no rubs, murmurs or gallops. Regular rate and rhythm.  Respiratory: Clear to auscultation bilaterally, no wheezing, rales or rhonchi  Abdomen: Soft, nontender, nondistended, + bowel sounds  Ext: no cyanosis clubbing or edema  Neuro:not following any commands   Skin: No rashes  Psych:somnolent  Data Review   Micro Results Recent Results (from the past 240 hour(s))  MRSA PCR Screening     Status: None   Collection Time: 02/13/15  2:42 AM  Result Value Ref Range Status   MRSA by PCR NEGATIVE NEGATIVE Final    Comment:        The GeneXpert MRSA Assay (FDA approved for NASAL specimens only), is one component of a comprehensive MRSA colonization surveillance program. It is not intended to diagnose MRSA infection nor to guide or monitor treatment for MRSA infections.     Radiology Reports Dg Chest 1 View  02/12/2015   CLINICAL DATA:  Patient was found outside on the ground. Patient lives in a memory care unit. Unknown if she fell from a standing position or from her wheelchair. Hematoma on the head.  EXAM: CHEST  1 VIEW  COMPARISON:  02/06/2015  FINDINGS: The heart is mildly enlarged. The lungs are clear. There are no focal consolidations or pleural effusions. There are chronic changes in the shoulders bilaterally. Moderate spondylosis identified in the thoracic spine.  IMPRESSION: Cardiomegaly.  No evidence for acute  abnormality.   Electronically Signed   By: Norva Pavlov M.D.   On: 02/12/2015 21:26     Dg Chest 2 View  02/06/2015   CLINICAL DATA:  Unwitnessed fall  EXAM: CHEST  2 VIEW  COMPARISON:  01/11/2015  FINDINGS: Chronic mild cardiomegaly. Negative aortic and hilar contours. Continued improvement in right lung aeration with resolved right infrahilar opacity. There is no edema, consolidation, effusion, or pneumothorax.  No acute  osseous findings. Rotator cuff repair with advanced glenohumeral osteoarthritis on the right.  IMPRESSION: No active cardiopulmonary disease.   Electronically Signed   By: Marnee Spring M.D.   On: 02/06/2015 10:46   Ct Head Wo Contrast  02/12/2015   CLINICAL DATA:  Unwitnessed fall. Confused patient. Hematoma to the back of the head.  EXAM: CT HEAD WITHOUT CONTRAST  CT CERVICAL SPINE WITHOUT CONTRAST  TECHNIQUE: Multidetector CT imaging of the head and cervical spine was performed following the standard protocol without intravenous contrast. Multiplanar CT image reconstructions of the cervical spine were also generated.  COMPARISON:  01/07/2015  FINDINGS: CT HEAD FINDINGS  Ventricles normal configuration. There is ventricular and sulcal enlargement reflecting moderate generalized atrophy, stable from the prior study. No hydrocephalus.  There are no parenchymal masses or mass effect. Patchy areas of white matter hypoattenuation are appreciated consistent with mild to moderate chronic microvascular ischemic change, stable. There is no evidence of a cortical infarct.  There are no extra-axial masses or abnormal fluid collections.  There is no intracranial hemorrhage.  There is a posterior scalp hematoma. There is no underlying fracture. Visualized sinuses and mastoid air cells are clear.  CT CERVICAL SPINE FINDINGS  No fracture. No spondylolisthesis. Mild loss of disc height at C5-C6 and C6-C7. There are small endplate osteophytes from C4 through C7. Facet degenerative changes noted bilaterally, most prominent on the left at C4-C5. Soft tissues show carotid vascular  calcifications but are otherwise unremarkable. There is mild scarring at the lung apices.  IMPRESSION: HEAD CT: No acute intracranial abnormalities. No skull fracture. Posterior scalp hematoma.  CERVICAL CT:  No fracture or acute finding.   Electronically Signed   By: Amie Portland M.D.   On: 02/12/2015 21:13   Ct Cervical Spine Wo Contrast  02/12/2015   CLINICAL DATA:  Unwitnessed fall. Confused patient. Hematoma to the back of the head.  EXAM: CT HEAD WITHOUT CONTRAST  CT CERVICAL SPINE WITHOUT CONTRAST  TECHNIQUE: Multidetector CT imaging of the head and cervical spine was performed following the standard protocol without intravenous contrast. Multiplanar CT image reconstructions of the cervical spine were also generated.  COMPARISON:  01/07/2015  FINDINGS: CT HEAD FINDINGS  Ventricles normal configuration. There is ventricular and sulcal enlargement reflecting moderate generalized atrophy, stable from the prior study. No hydrocephalus.  There are no parenchymal masses or mass effect. Patchy areas of white matter hypoattenuation are appreciated consistent with mild to moderate chronic microvascular ischemic change, stable. There is no evidence of a cortical infarct.  There are no extra-axial masses or abnormal fluid collections.  There is no intracranial hemorrhage.  There is a posterior scalp hematoma. There is no underlying fracture. Visualized sinuses and mastoid air cells are clear.  CT CERVICAL SPINE FINDINGS  No fracture. No spondylolisthesis. Mild loss of disc height at C5-C6 and C6-C7. There are small endplate osteophytes from C4 through C7. Facet degenerative changes noted bilaterally, most prominent on the left at C4-C5. Soft tissues show carotid vascular calcifications but are otherwise unremarkable. There is mild scarring at the lung apices.  IMPRESSION: HEAD CT: No acute intracranial abnormalities. No skull fracture. Posterior scalp hematoma.  CERVICAL CT:  No fracture or acute finding.    Electronically Signed   By: Amie Portland M.D.   On: 02/12/2015 21:13   Dg Knee Complete 4 Views Right  02/06/2015   CLINICAL DATA:  Status post fall.  Right knee pain.  EXAM: RIGHT KNEE - COMPLETE 4+ VIEW  COMPARISON:  None.  FINDINGS: There is no evidence of fracture, dislocation, or joint effusion. There is chondrocalcinosis of the medial and lateral femorotibial compartments as can be seen with CPPD. There is mild tricompartmental osteoarthritis. There is generalized osteopenia. Soft tissues are unremarkable.  IMPRESSION: No acute osseous injury of the right knee.   Electronically Signed   By: Elige Ko   On: 02/06/2015 10:47   Dg Hip Unilat With Pelvis 2-3 Views Left  02/12/2015   CLINICAL DATA:  Pt is from [facility], memory care - Pt was outside when staff found her on the ground. Unknown if she fell from a standing position or from her wheelchair. Pupils equal and reactive. EKG showed right bundle branch block and 1st degree. Hematoma on head, no laceration or bleeding.  EXAM: DG HIP (WITH OR WITHOUT PELVIS) 2-3V LEFT  COMPARISON:  None.  FINDINGS: No acute fracture. Hip joint is normally aligned. There is bony productive change along the acetabular margins bilaterally, mostly superiorly, with mild hip joint space narrowing superior laterally.  SI joints and symphysis pubis are normally spaced and aligned.  Bones are demineralized.  Soft tissues are unremarkable.  IMPRESSION: No acute fracture or dislocation.   Electronically Signed   By: Amie Portland M.D.   On: 02/12/2015 21:25   Dg Hip Unilat With Pelvis 2-3 Views Right  02/06/2015   CLINICAL DATA:  Unwitnessed fall.  EXAM: DG HIP (WITH OR WITHOUT PELVIS) 2-3V RIGHT  COMPARISON:  None.  FINDINGS: Diffuse osteopenia and degenerative change. No acute bony or joint abnormality identified. No evidence of fracture or dislocation.  IMPRESSION: Diffuse osteopenia degenerative change.  No acute abnormality .   Electronically Signed   By: Maisie Fus   Register   On: 02/06/2015 10:48    CBC  Recent Labs Lab 02/12/15 2217 02/13/15 0300  WBC 11.4* 10.9*  HGB 10.9* 11.1*  HCT 33.0* 34.0*  PLT 208 219  MCV 88.7 89.7  MCH 29.3 29.3  MCHC 33.0 32.6  RDW 13.2 13.3  LYMPHSABS 1.4  --   MONOABS 0.6  --   EOSABS 0.0  --   BASOSABS 0.0  --     Chemistries   Recent Labs Lab 02/12/15 2217 02/13/15 0300  NA 140 140  K 4.3 4.1  CL 104 105  CO2 29 28  GLUCOSE 137* 112*  BUN 17 17  CREATININE 0.96 0.88  CALCIUM 9.0 9.1  AST  --  18  ALT  --  16  ALKPHOS  --  73  BILITOT  --  0.5   ------------------------------------------------------------------------------------------------------------------ estimated creatinine clearance is 41.9 mL/min (by C-G formula based on Cr of 0.88). ------------------------------------------------------------------------------------------------------------------ No results for input(s): HGBA1C in the last 72 hours. ------------------------------------------------------------------------------------------------------------------ No results for input(s): CHOL, HDL, LDLCALC, TRIG, CHOLHDL, LDLDIRECT in the last 72 hours. ------------------------------------------------------------------------------------------------------------------ No results for input(s): TSH, T4TOTAL, T3FREE, THYROIDAB in the last 72 hours.  Invalid input(s): FREET3 ------------------------------------------------------------------------------------------------------------------ No results for input(s): VITAMINB12, FOLATE, FERRITIN, TIBC, IRON, RETICCTPCT in the last 72 hours.  Coagulation profile  Recent Labs Lab 02/13/15 0300  INR 1.01    No results for input(s): DDIMER in the last 72 hours.  Cardiac Enzymes No results for input(s): CKMB, TROPONINI, MYOGLOBIN in the last 168 hours.  Invalid input(s):  CK ------------------------------------------------------------------------------------------------------------------ Invalid input(s): POCBNP   Recent Labs  02/12/15 2036  GLUCAP 122*     Sabri Teal M.D. [facility] 02/13/2015, 10:35 AM  Pager: [phone] Between 7am to 7pm - call Pager - [phone]  After  7pm go to [email or web address] - password [email or web address]  Call night coverage person covering after 7pm

## 2015-02-13 NOTE — Progress Notes (Signed)
ANTIBIOTIC CONSULT NOTE - INITIAL  Pharmacy Consult for Rocephin Indication: UTI  Allergies  Allergen Reactions  . Morphine And Related     Not noted on MAR  . Codeine Rash  . Hydrocodone Rash    Patient Measurements: Weight: 170 lb 6.7 oz (77.3 kg)  Vital Signs: Temp: 97.8 F (36.6 C) (09/13 0224) Temp Source: Oral (09/13 0224) BP: 151/47 mmHg (09/13 0224) Pulse Rate: 80 (09/13 0224) Intake/Output from previous day:   Intake/Output from this shift:    Labs:  Recent Labs  02/12/15 2217 02/13/15 0300  WBC 11.4* 10.9*  HGB 10.9* 11.1*  PLT 208 219  CREATININE 0.96 0.88   Estimated Creatinine Clearance: 41.9 mL/min (by C-G formula based on Cr of 0.88). No results for input(s): VANCOTROUGH, VANCOPEAK, VANCORANDOM, GENTTROUGH, GENTPEAK, GENTRANDOM, TOBRATROUGH, TOBRAPEAK, TOBRARND, AMIKACINPEAK, AMIKACINTROU, AMIKACIN in the last 72 hours.   Microbiology: Recent Results (from the past 720 hour(s))  MRSA PCR Screening     Status: None   Collection Time: 02/13/15  2:42 AM  Result Value Ref Range Status   MRSA by PCR NEGATIVE NEGATIVE Final    Comment:        The GeneXpert MRSA Assay (FDA approved for NASAL specimens only), is one component of a comprehensive MRSA colonization surveillance program. It is not intended to diagnose MRSA infection nor to guide or monitor treatment for MRSA infections.     Medical History: Past Medical History  Diagnosis Date  . Arthritis   . Chicken pox   . Chronic kidney disease   . Colon polyps   . Hypertension   . Dementia   . Angina   . Dysrhythmia   . Heart murmur   . Shortness of breath   . Recurrent upper respiratory infection (URI)   . Pneumonia   . Hypothyroidism   . Anemia   . Headache(784.0)   . Anxiety   . Depression   . Valvular heart disease 09/04/2012  . Left leg pain 10/02/2012  . UTI (urinary tract infection) 12/30/2012  . Acute bronchitis 04/10/2013  . Dehydration 08/31/2013  . Arthritis 10/02/2012     Medications:  Prescriptions prior to admission  Medication Sig Dispense Refill Last Dose  . albuterol (PROVENTIL HFA;VENTOLIN HFA) 108 (90 BASE) MCG/ACT inhaler Inhale 2 puffs into the lungs every 6 (six) hours as needed for wheezing or shortness of breath. 1 Inhaler 0 unknown  . aspirin 81 MG tablet Take 81 mg by mouth daily.   02/12/2015 at Unknown time  . clonazePAM (KLONOPIN) 1 MG tablet Take 1 mg by mouth every 4 (four) hours as needed for anxiety.   unknown  . levothyroxine (SYNTHROID, LEVOTHROID) 50 MCG tablet TAKE 1 TABLET (50 MCG TOTAL) BY MOUTH DAILY. 30 tablet 3 02/12/2015 at Unknown time  . risperiDONE (RISPERDAL) 1 MG tablet Take 1 mg by mouth at bedtime.   02/11/2015 at Unknown time  . traZODone (DESYREL) 50 MG tablet Take 1 tablet (50 mg total) by mouth at bedtime. 30 tablet 0 02/11/2015 at Unknown time  . clonazePAM (KLONOPIN) 0.5 MG tablet Take 0.5 tablets (0.25 mg total) by mouth 2 (two) times daily. 30 tablet 0 02/05/2015 at Unknown time   Assessment: 81 y.o. female presents from [facility] after falling. UA shows UTI. To begin Rocephin for UTI.   Goal of Therapy:  Resolution of infection  Plan:  Rocephin 1gm IV q24h Pharmacy will sign off - please reconsult if needed  Christoper Fabian, PharmD, BCPS Clinical pharmacist, pager  [phone] 02/13/2015,5:44 AM

## 2015-02-13 NOTE — Progress Notes (Signed)
Pt arrived to floor from [facility] ED. VS stable. Pt stable and with baseline dementia. Pt with hematoma to back of head s/p fall. Admission history not obtainable d/t pt confusion.

## 2015-02-14 DIAGNOSIS — R296 Repeated falls: Secondary | ICD-10-CM

## 2015-02-14 DIAGNOSIS — G9340 Encephalopathy, unspecified: Secondary | ICD-10-CM

## 2015-02-14 DIAGNOSIS — N390 Urinary tract infection, site not specified: Principal | ICD-10-CM

## 2015-02-14 DIAGNOSIS — F0391 Unspecified dementia with behavioral disturbance: Secondary | ICD-10-CM

## 2015-02-14 DIAGNOSIS — E038 Other specified hypothyroidism: Secondary | ICD-10-CM

## 2015-02-14 LAB — CBC
HCT: 32.7 % — ABNORMAL LOW (ref 36.0–46.0)
HEMOGLOBIN: 10.5 g/dL — AB (ref 12.0–15.0)
MCH: 28.5 pg (ref 26.0–34.0)
MCHC: 32.1 g/dL (ref 30.0–36.0)
MCV: 88.9 fL (ref 78.0–100.0)
PLATELETS: 228 10*3/uL (ref 150–400)
RBC: 3.68 MIL/uL — ABNORMAL LOW (ref 3.87–5.11)
RDW: 13.3 % (ref 11.5–15.5)
WBC: 6.9 10*3/uL (ref 4.0–10.5)

## 2015-02-14 LAB — BASIC METABOLIC PANEL
Anion gap: 7 (ref 5–15)
BUN: 11 mg/dL (ref 6–20)
CALCIUM: 8.7 mg/dL — AB (ref 8.9–10.3)
CHLORIDE: 105 mmol/L (ref 101–111)
CO2: 27 mmol/L (ref 22–32)
CREATININE: 0.79 mg/dL (ref 0.44–1.00)
Glucose, Bld: 102 mg/dL — ABNORMAL HIGH (ref 65–99)
Potassium: 4 mmol/L (ref 3.5–5.1)
SODIUM: 139 mmol/L (ref 135–145)

## 2015-02-14 LAB — RPR: RPR: NONREACTIVE

## 2015-02-14 MED ORDER — BISACODYL 10 MG RE SUPP
10.0000 mg | Freq: Once | RECTAL | Status: AC
Start: 2015-02-14 — End: 2015-02-14
  Administered 2015-02-14: 10 mg via RECTAL
  Filled 2015-02-14: qty 1

## 2015-02-14 MED ORDER — DOCUSATE SODIUM 100 MG PO CAPS
100.0000 mg | ORAL_CAPSULE | Freq: Two times a day (BID) | ORAL | Status: DC
  Administered 2015-02-14: 100 mg via ORAL
  Filled 2015-02-14 (×2): qty 1

## 2015-02-14 MED ORDER — LEVOTHYROXINE SODIUM 50 MCG PO TABS
50.0000 ug | ORAL_TABLET | Freq: Every day | ORAL | Status: DC
  Administered 2015-02-15: 50 ug via ORAL
  Filled 2015-02-14: qty 1

## 2015-02-14 NOTE — Evaluation (Signed)
Physical Therapy Evaluation Patient Details Name: [name] MRN: [id number] DOB: [birth year] Today's Date: 02/14/2015   History of Present Illness  [name] is a 81 y.o. female with Past medical history of dementia, hypertension, chronic kidney disease, recurrent fall, recurrent admissions for encephalopathy from polypharmacy presenting from [facility] memory care unit after a fall.  Clinical Impression  Pt admitted with/for fall and AMS.  Pt currently limited functionally due to the problems listed. ( See problems list.)   Pt will benefit from PT to maximize function and safety in order to get ready for next venue listed below.     Follow Up Recommendations SNF    Equipment Recommendations  None recommended by PT    Recommendations for Other Services       Precautions / Restrictions Precautions Precautions: Fall      Mobility  Bed Mobility Overal bed mobility: Needs Assistance Bed Mobility: Supine to Sit;Sit to Supine     Supine to sit: Min assist Sit to supine: Min guard   General bed mobility comments: slow, but surprisingly good movement  Transfers Overall transfer level: Needs assistance   Transfers: Sit to/from Stand Sit to Stand: Min assist            Ambulation/Gait Ambulation/Gait assistance: Min assist Ambulation Distance (Feet): 140 Feet Assistive device: Rolling walker (2 wheeled) Gait Pattern/deviations: Step-through pattern;Decreased step length - right;Decreased step length - left Gait velocity: slower   General Gait Details: mildly unsteady gait; wanders in the RW.  Stooped posture.  Stairs            Wheelchair Mobility    Modified Rankin (Stroke Patients Only)       Balance Overall balance assessment: Needs assistance Sitting-balance support: No upper extremity supported Sitting balance-Leahy Scale: Fair       Standing balance-Leahy Scale: Poor Standing balance comment: uses legs against the bed or RW for  stability                             Pertinent Vitals/Pain Pain Assessment: Faces Faces Pain Scale: Hurts little more Pain Location: vague comments Pain Descriptors / Indicators: Grimacing    Home Living Family/patient expects to be discharged to:: Skilled nursing facility (memory care unit)               Home Equipment: Dan Humphreys - 2 wheels;Cane - single point;Shower seat      Prior Function Level of Independence: Needs assistance      ADL's / Homemaking Assistance Needed: uses RW        Hand Dominance   Dominant Hand: Right    Extremity/Trunk Assessment               Lower Extremity Assessment: Overall WFL for tasks assessed;Generalized weakness (proximal weakness, truncal weaknesses)      Cervical / Trunk Assessment: Kyphotic  Communication   Communication: No difficulties  Cognition Arousal/Alertness: Awake/alert Behavior During Therapy: Flat affect Overall Cognitive Status: History of cognitive impairments - at baseline                      General Comments      Exercises        Assessment/Plan    PT Assessment Patient needs continued PT services  PT Diagnosis Generalized weakness   PT Problem List Decreased strength;Decreased activity tolerance;Decreased balance;Decreased mobility;Decreased knowledge of use of DME  PT Treatment Interventions Gait training;Functional mobility  training;Therapeutic activities;Balance training;Patient/family education;DME instruction   PT Goals (Current goals can be found in the Care Plan section) Acute Rehab PT Goals Patient Stated Goal: pt wanted to get rid of her constipation PT Goal Formulation: With patient Time For Goal Achievement: 02/21/15 Potential to Achieve Goals: Good    Frequency Min 2X/week   Barriers to discharge        Co-evaluation               End of Session   Activity Tolerance: Patient tolerated treatment well Patient left: in bed;with call bell/phone  within reach;with nursing/sitter in room Nurse Communication: Mobility status         Time: 1610-9604 PT Time Calculation (min) (ACUTE ONLY): 21 min   Charges:   PT Evaluation $Initial PT Evaluation Tier I: 1 Procedure     PT G Codes:        Alastor Kneale, Eliseo Gum 02/14/2015, 4:27 PM 02/14/2015  [place] [facility], PT [phone]  (pager)

## 2015-02-14 NOTE — Progress Notes (Signed)
PROGRESS NOTE  [name] NGE:[id number] DOB: [birth year] DOA: 02/12/2015 PCP: No primary care provider on file.  Brief History 81 year old female with a history of dementia, hypertension, hypothyroidism, gait instability, frequent falls presented to the emergency department when she was found to have an unwitnessed fall at her assisted living facility, [facility]. Apparently, the patient was found on the floor. She was lethargic and not following commands. As a result, the patient was sent to the emergency department. Initial workup revealed significant pyuria, serum creatinine 0.88, WBC 11.4. Assessment/Plan: Acute encephalopathy superimposed on dementia -worsened due to UTI - Patient presented with unwitnessed fall -02/14/15--mentation improved - Follow urine culture, continue IV Rocephin - Obtain TSH,  -B12--507 -folate--8.1  -RPR--NR -ammonia level--27 - CT head negative for any CVA or intracranial pathology -hold hypnotic meds including klonopin, trazadone and risperdal for now  UTI - Follow urine culture and sensitivities,  -continue empiric IV Rocephin   Hypertension - Currently stable, place on hydralazine as needed with parameters   Hypothyroidism -Obtain TSH--3.722 -switch back to po synthroid   Dementia -intolerance to aricept in past -may ultimately benefit from exelon patch   Recurrent falls - PT evaluation  Impaired glucose tolerance -Hemoglobin A1c 6.0 -Given the patient's age and comorbidities, allow for liberal glycemic control    Family Communication:   Pt at beside Disposition Plan:   ALF/SNF in 1-2 days       Procedures/Studies: Dg Chest 1 View  02/12/2015   CLINICAL DATA:  Patient was found outside on the ground. Patient lives in a memory care unit. Unknown if she fell from a standing position or from her wheelchair. Hematoma on the head.  EXAM: CHEST  1 VIEW  COMPARISON:  02/06/2015  FINDINGS: The heart is mildly  enlarged. The lungs are clear. There are no focal consolidations or pleural effusions. There are chronic changes in the shoulders bilaterally. Moderate spondylosis identified in the thoracic spine.  IMPRESSION: Cardiomegaly.  No evidence for acute  abnormality.   Electronically Signed   By: Norva Pavlov M.D.   On: 02/12/2015 21:26   Dg Chest 2 View  02/06/2015   CLINICAL DATA:  Unwitnessed fall  EXAM: CHEST  2 VIEW  COMPARISON:  01/11/2015  FINDINGS: Chronic mild cardiomegaly. Negative aortic and hilar contours. Continued improvement in right lung aeration with resolved right infrahilar opacity. There is no edema, consolidation, effusion, or pneumothorax.  No acute osseous findings. Rotator cuff repair with advanced glenohumeral osteoarthritis on the right.  IMPRESSION: No active cardiopulmonary disease.   Electronically Signed   By: Marnee Spring M.D.   On: 02/06/2015 10:46   Ct Head Wo Contrast  02/12/2015   CLINICAL DATA:  Unwitnessed fall. Confused patient. Hematoma to the back of the head.  EXAM: CT HEAD WITHOUT CONTRAST  CT CERVICAL SPINE WITHOUT CONTRAST  TECHNIQUE: Multidetector CT imaging of the head and cervical spine was performed following the standard protocol without intravenous contrast. Multiplanar CT image reconstructions of the cervical spine were also generated.  COMPARISON:  01/07/2015  FINDINGS: CT HEAD FINDINGS  Ventricles normal configuration. There is ventricular and sulcal enlargement reflecting moderate generalized atrophy, stable from the prior study. No hydrocephalus.  There are no parenchymal masses or mass effect. Patchy areas of white matter hypoattenuation are appreciated consistent with mild to moderate chronic microvascular ischemic change, stable. There is no evidence of a cortical infarct.  There are no extra-axial masses or abnormal fluid collections.  There is no intracranial hemorrhage.  There is a posterior scalp hematoma. There is no underlying fracture. Visualized  sinuses and mastoid air cells are clear.  CT CERVICAL SPINE FINDINGS  No fracture. No spondylolisthesis. Mild loss of disc height at C5-C6 and C6-C7. There are small endplate osteophytes from C4 through C7. Facet degenerative changes noted bilaterally, most prominent on the left at C4-C5. Soft tissues show carotid vascular calcifications but are otherwise unremarkable. There is mild scarring at the lung apices.  IMPRESSION: HEAD CT: No acute intracranial abnormalities. No skull fracture. Posterior scalp hematoma.  CERVICAL CT:  No fracture or acute finding.   Electronically Signed   By: Amie Portland M.D.   On: 02/12/2015 21:13   Ct Cervical Spine Wo Contrast  02/12/2015   CLINICAL DATA:  Unwitnessed fall. Confused patient. Hematoma to the back of the head.  EXAM: CT HEAD WITHOUT CONTRAST  CT CERVICAL SPINE WITHOUT CONTRAST  TECHNIQUE: Multidetector CT imaging of the head and cervical spine was performed following the standard protocol without intravenous contrast. Multiplanar CT image reconstructions of the cervical spine were also generated.  COMPARISON:  01/07/2015  FINDINGS: CT HEAD FINDINGS  Ventricles normal configuration. There is ventricular and sulcal enlargement reflecting moderate generalized atrophy, stable from the prior study. No hydrocephalus.  There are no parenchymal masses or mass effect. Patchy areas of white matter hypoattenuation are appreciated consistent with mild to moderate chronic microvascular ischemic change, stable. There is no evidence of a cortical infarct.  There are no extra-axial masses or abnormal fluid collections.  There is no intracranial hemorrhage.  There is a posterior scalp hematoma. There is no underlying fracture. Visualized sinuses and mastoid air cells are clear.  CT CERVICAL SPINE FINDINGS  No fracture. No spondylolisthesis. Mild loss of disc height at C5-C6 and C6-C7. There are small endplate osteophytes from C4 through C7. Facet degenerative changes noted  bilaterally, most prominent on the left at C4-C5. Soft tissues show carotid vascular calcifications but are otherwise unremarkable. There is mild scarring at the lung apices.  IMPRESSION: HEAD CT: No acute intracranial abnormalities. No skull fracture. Posterior scalp hematoma.  CERVICAL CT:  No fracture or acute finding.   Electronically Signed   By: Amie Portland M.D.   On: 02/12/2015 21:13   Dg Knee Complete 4 Views Right  02/06/2015   CLINICAL DATA:  Status post fall.  Right knee pain.  EXAM: RIGHT KNEE - COMPLETE 4+ VIEW  COMPARISON:  None.  FINDINGS: There is no evidence of fracture, dislocation, or joint effusion. There is chondrocalcinosis of the medial and lateral femorotibial compartments as can be seen with CPPD. There is mild tricompartmental osteoarthritis. There is generalized osteopenia. Soft tissues are unremarkable.  IMPRESSION: No acute osseous injury of the right knee.   Electronically Signed   By: Elige Ko   On: 02/06/2015 10:47   Dg Hip Unilat With Pelvis 2-3 Views Left  02/12/2015   CLINICAL DATA:  Pt is from [facility], memory care - Pt was outside when staff found her on the ground. Unknown if she fell from a standing position or from her wheelchair. Pupils equal and reactive. EKG showed right bundle branch block and 1st degree. Hematoma on head, no laceration or bleeding.  EXAM: DG HIP (WITH OR WITHOUT PELVIS) 2-3V LEFT  COMPARISON:  None.  FINDINGS: No acute fracture. Hip joint is normally aligned. There is bony productive change along the acetabular margins bilaterally, mostly superiorly, with mild hip joint space narrowing superior  laterally.  SI joints and symphysis pubis are normally spaced and aligned.  Bones are demineralized.  Soft tissues are unremarkable.  IMPRESSION: No acute fracture or dislocation.   Electronically Signed   By: Amie Portland M.D.   On: 02/12/2015 21:25   Dg Hip Unilat With Pelvis 2-3 Views Right  02/06/2015   CLINICAL DATA:  Unwitnessed fall.   EXAM: DG HIP (WITH OR WITHOUT PELVIS) 2-3V RIGHT  COMPARISON:  None.  FINDINGS: Diffuse osteopenia and degenerative change. No acute bony or joint abnormality identified. No evidence of fracture or dislocation.  IMPRESSION: Diffuse osteopenia degenerative change.  No acute abnormality .   Electronically Signed   By: Maisie Fus  Register   On: 02/06/2015 10:48        Subjective: Patient is pleasant and confused. She states that "I hurt all over". Denies any chest pain, shortness breath, vomiting, nausea, abdominal pain, dysuria, hematuria.  Objective: Filed Vitals:   02/14/15 0028 02/14/15 0432 02/14/15 0806 02/14/15 1215  BP: 120/77 135/65 127/50 120/54  Pulse: 71 75 67 60  Temp: 98.8 F (37.1 C) 98.4 F (36.9 C) 97.6 F (36.4 C) 97.9 F (36.6 C)  TempSrc: Oral Oral Oral Oral  Resp: 20  20 20   Weight:  73.029 kg (161 lb)    SpO2: 98% 96% 97% 99%    Intake/Output Summary (Last 24 hours) at 02/14/15 1309 Last data filed at 02/14/15 1200  Gross per 24 hour  Intake 800.83 ml  Output    600 ml  Net 200.83 ml   Weight change: -4.271 kg (-9 lb 6.7 oz) Exam:   General:  Pt is alert, follows commands appropriately, not in acute distress  HEENT: No icterus, No thrush, No neck mass, /AT  Cardiovascular: RRR, S1/S2, no rubs, no gallops  Respiratory: Right basilar crackles. Left closed to auscultation. no wheezing, no crackles, no rhonchi  Abdomen: Soft/+BS, non tender, non distended, no guarding  Extremities: No edema, No lymphangitis, No petechiae, No rashes, no synovitis  Data Reviewed: Basic Metabolic Panel:  Recent Labs Lab 02/12/15 2217 02/13/15 0300 02/14/15 0302  NA 140 140 139  K 4.3 4.1 4.0  CL 104 105 105  CO2 29 28 27   GLUCOSE 137* 112* 102*  BUN 17 17 11   CREATININE 0.96 0.88 0.79  CALCIUM 9.0 9.1 8.7*   Liver Function Tests:  Recent Labs Lab 02/13/15 0300  AST 18  ALT 16  ALKPHOS 73  BILITOT 0.5  PROT 5.9*  ALBUMIN 3.1*   No results for  input(s): LIPASE, AMYLASE in the last 168 hours.  Recent Labs Lab 02/13/15 1122  AMMONIA 27   CBC:  Recent Labs Lab 02/12/15 2217 02/13/15 0300 02/14/15 0302  WBC 11.4* 10.9* 6.9  NEUTROABS 9.4*  --   --   HGB 10.9* 11.1* 10.5*  HCT 33.0* 34.0* 32.7*  MCV 88.7 89.7 88.9  PLT 208 219 228   Cardiac Enzymes:  Recent Labs Lab 02/12/15 2217  CKTOTAL 50   BNP: Invalid input(s): POCBNP CBG:  Recent Labs Lab 02/12/15 2036  GLUCAP 122*    Recent Results (from the past 240 hour(s))  MRSA PCR Screening     Status: None   Collection Time: 02/13/15  2:42 AM  Result Value Ref Range Status   MRSA by PCR NEGATIVE NEGATIVE Final    Comment:        The GeneXpert MRSA Assay (FDA approved for NASAL specimens only), is one component of a comprehensive MRSA colonization surveillance program.  It is not intended to diagnose MRSA infection nor to guide or monitor treatment for MRSA infections.      Scheduled Meds: . cefTRIAXone (ROCEPHIN)  IV  1 g Intravenous Q24H  . enoxaparin (LOVENOX) injection  40 mg Subcutaneous Q24H  . [START ON 02/15/2015] levothyroxine  50 mcg Oral QAC breakfast  . sodium chloride  3 mL Intravenous Q12H   Continuous Infusions: . sodium chloride 50 mL/hr at 02/13/15 2337     Marissa Lowrey, DO  [facility] Pager [phone]  If 7PM-7AM, please contact night-coverage [email or web address] Password [email or web address] 02/14/2015, 1:09 PM   LOS: 1 day

## 2015-02-15 ENCOUNTER — Emergency Department (HOSPITAL_COMMUNITY)
Admission: EM | Admit: 2015-02-15 | Discharge: 2015-02-16 | Disposition: A | Discharge status: Home / Self Care | Payer: Commercial Managed Care - HMO | Attending: Emergency Medicine | Admitting: Emergency Medicine

## 2015-02-15 ENCOUNTER — Encounter (HOSPITAL_COMMUNITY): Payer: Self-pay | Admitting: Nurse Practitioner

## 2015-02-15 DIAGNOSIS — Z8619 Personal history of other infectious and parasitic diseases: Secondary | ICD-10-CM | POA: Insufficient documentation

## 2015-02-15 DIAGNOSIS — I129 Hypertensive chronic kidney disease with stage 1 through stage 4 chronic kidney disease, or unspecified chronic kidney disease: Secondary | ICD-10-CM | POA: Diagnosis not present

## 2015-02-15 DIAGNOSIS — Z043 Encounter for examination and observation following other accident: Secondary | ICD-10-CM | POA: Insufficient documentation

## 2015-02-15 DIAGNOSIS — Z87891 Personal history of nicotine dependence: Secondary | ICD-10-CM | POA: Insufficient documentation

## 2015-02-15 DIAGNOSIS — Z7982 Long term (current) use of aspirin: Secondary | ICD-10-CM | POA: Insufficient documentation

## 2015-02-15 DIAGNOSIS — Z86010 Personal history of colonic polyps: Secondary | ICD-10-CM | POA: Diagnosis not present

## 2015-02-15 DIAGNOSIS — W1839XA Other fall on same level, initial encounter: Secondary | ICD-10-CM | POA: Insufficient documentation

## 2015-02-15 DIAGNOSIS — Z8701 Personal history of pneumonia (recurrent): Secondary | ICD-10-CM | POA: Insufficient documentation

## 2015-02-15 DIAGNOSIS — N189 Chronic kidney disease, unspecified: Secondary | ICD-10-CM | POA: Diagnosis not present

## 2015-02-15 DIAGNOSIS — Z79899 Other long term (current) drug therapy: Secondary | ICD-10-CM | POA: Insufficient documentation

## 2015-02-15 DIAGNOSIS — E039 Hypothyroidism, unspecified: Secondary | ICD-10-CM | POA: Insufficient documentation

## 2015-02-15 DIAGNOSIS — Y9389 Activity, other specified: Secondary | ICD-10-CM | POA: Insufficient documentation

## 2015-02-15 DIAGNOSIS — Z87440 Personal history of urinary (tract) infections: Secondary | ICD-10-CM | POA: Insufficient documentation

## 2015-02-15 DIAGNOSIS — Y998 Other external cause status: Secondary | ICD-10-CM | POA: Insufficient documentation

## 2015-02-15 DIAGNOSIS — F0390 Unspecified dementia without behavioral disturbance: Secondary | ICD-10-CM | POA: Insufficient documentation

## 2015-02-15 DIAGNOSIS — M1990 Unspecified osteoarthritis, unspecified site: Secondary | ICD-10-CM | POA: Insufficient documentation

## 2015-02-15 DIAGNOSIS — W19XXXA Unspecified fall, initial encounter: Secondary | ICD-10-CM

## 2015-02-15 DIAGNOSIS — Y92129 Unspecified place in nursing home as the place of occurrence of the external cause: Secondary | ICD-10-CM | POA: Insufficient documentation

## 2015-02-15 DIAGNOSIS — N3000 Acute cystitis without hematuria: Secondary | ICD-10-CM

## 2015-02-15 DIAGNOSIS — R011 Cardiac murmur, unspecified: Secondary | ICD-10-CM | POA: Insufficient documentation

## 2015-02-15 DIAGNOSIS — I10 Essential (primary) hypertension: Secondary | ICD-10-CM

## 2015-02-15 LAB — URINE CULTURE: Culture: >=100000

## 2015-02-15 LAB — BASIC METABOLIC PANEL
Anion gap: 7 (ref 5–15)
BUN: 11 mg/dL (ref 6–20)
CHLORIDE: 106 mmol/L (ref 101–111)
CO2: 26 mmol/L (ref 22–32)
Calcium: 8.7 mg/dL — ABNORMAL LOW (ref 8.9–10.3)
Creatinine, Ser: 0.75 mg/dL (ref 0.44–1.00)
GFR calc non Af Amer: >60 mL/min (ref >60)
Glucose, Bld: 95 mg/dL (ref 65–99)
POTASSIUM: 3.8 mmol/L (ref 3.5–5.1)
SODIUM: 139 mmol/L (ref 135–145)

## 2015-02-15 MED ORDER — AMOXICILLIN 500 MG PO CAPS
500.0000 mg | ORAL_CAPSULE | Freq: Three times a day (TID) | ORAL | Status: DC
  Administered 2015-02-15: 500 mg via ORAL
  Filled 2015-02-15: qty 1

## 2015-02-15 MED ORDER — AMOXICILLIN 500 MG PO CAPS: 500.0000 mg | ORAL_CAPSULE | Freq: Three times a day (TID) | ORAL | Status: DC

## 2015-02-15 MED ORDER — AMOXICILLIN 500 MG PO CAPS
500.0000 mg | ORAL_CAPSULE | Freq: Three times a day (TID) | ORAL | Status: DC
Start: 2015-02-15 — End: 2015-02-15

## 2015-02-15 NOTE — Progress Notes (Signed)
Patient d/c'd home today via EMS after d/c received from MD.  CSW notified patient's son Desarae Placide and nursing notified as well. Fl2 updated and clinicals sent to facility for review.  Ok per Clydie Braun for return to facility. She will continue to receive Home Health services through the ALF. No further CSW needs identified.  CSW signing off.  Lorri Frederick. Jaci Lazier, [place] [phone]

## 2015-02-15 NOTE — Progress Notes (Signed)
CSW attempted to speak with patient at bedside. However, she was not communicative. CSW consulted with nurse who states that the patient is demented. There was no family present.  Per note, patient presents to [facility] due to unwitnessed fall. The patient is from [facility].  Trish Mage [phone] ED CSW 02/15/2015 9:59 PM

## 2015-02-15 NOTE — ED Provider Notes (Signed)
CSN: [id number]     Arrival date & time 02/15/15  2114 History   First MD Initiated Contact with Patient 02/15/15 2208     Chief Complaint  Patient presents with  . Fall     (Consider location/radiation/quality/duration/timing/severity/associated sxs/prior Treatment) HPI..... Level V caveat for dementia.   Patient apparently fell at her nursing home facility. No obvious bony tenderness. No head/neck trauma. Patient is resting comfortably with no obvious objective pain areas.  Past Medical History  Diagnosis Date  . Arthritis   . Chicken pox   . Chronic kidney disease   . Colon polyps   . Hypertension   . Dementia   . Angina   . Dysrhythmia   . Heart murmur   . Shortness of breath   . Recurrent upper respiratory infection (URI)   . Pneumonia   . Hypothyroidism   . Anemia   . Headache(784.0)   . Anxiety   . Depression   . Valvular heart disease 09/04/2012  . Left leg pain 10/02/2012  . UTI (urinary tract infection) 12/30/2012  . Acute bronchitis 04/10/2013  . Dehydration 08/31/2013  . Arthritis 10/02/2012   Past Surgical History  Procedure Laterality Date  . Appendectomy    . Cholecystectomy    . Kidney stones    . Tonsillectomy    . Abdominal hysterectomy    . Hand surgery    . Feet surgery    . Shoulder arthroscopy    . Back surgery    . Tonsillectomy    . Eye surgery      cataract removed and eye lids lifted  . Fracture surgery      bilateral arms  . Tubal ligation    . Colonoscopy w/ polypectomy     Family History  Problem Relation Age of Onset  . Heart disease Mother   . Heart disease Father   . Heart disease Other   . Birth defects Other    Social History  Substance Use Topics  . Smoking status: Former Smoker -- 0.20 packs/day for 1 years    Types: Cigarettes    Quit date: 06/02/1941  . Smokeless tobacco: Never Used  . Alcohol Use: No   OB History    No data available     Review of Systems  Unable to perform ROS: Dementia      Allergies   Morphine and related; Codeine; and Hydrocodone  Home Medications   Prior to Admission medications   Medication Sig Start Date End Date Taking? Authorizing Provider  albuterol (PROVENTIL HFA;VENTOLIN HFA) 108 (90 BASE) MCG/ACT inhaler Inhale 2 puffs into the lungs every 6 (six) hours as needed for wheezing or shortness of breath. 09/23/13  Yes Sandford Craze, NP  aspirin 81 MG tablet Take 81 mg by mouth daily.   Yes Historical Provider, MD  clonazePAM (KLONOPIN) 0.5 MG tablet Take 0.5 tablets (0.25 mg total) by mouth 2 (two) times daily. 01/16/15  Yes Meredeth Ide, MD  clonazePAM (KLONOPIN) 1 MG tablet Take 1 mg by mouth every 4 (four) hours as needed for anxiety.   Yes Historical Provider, MD  levothyroxine (SYNTHROID, LEVOTHROID) 50 MCG tablet TAKE 1 TABLET (50 MCG TOTAL) BY MOUTH DAILY. 09/25/14  Yes Judie Bonus, MD  risperiDONE (RISPERDAL) 1 MG tablet Take 1 mg by mouth at bedtime.   Yes Historical Provider, MD  traZODone (DESYREL) 50 MG tablet Take 50 mg by mouth at bedtime. 01/16/15  Yes Historical Provider, MD  amoxicillin (AMOXIL) 500 MG  capsule Take 1 capsule (500 mg total) by mouth every 8 (eight) hours. 02/15/15   Catarina Hartshorn, MD   BP 127/50 mmHg  Pulse 92  Temp(Src) 98.3 F (36.8 C) (Oral)  Resp 16  SpO2 94% Physical Exam  Constitutional:  Pleasant;  NAD  HENT:  Head: Normocephalic and atraumatic.  Eyes: Conjunctivae and EOM are normal. Pupils are equal, round, and reactive to light.  Neck: Normal range of motion. Neck supple.  Cardiovascular: Normal rate and regular rhythm.   Pulmonary/Chest: Effort normal and breath sounds normal.  Abdominal: Soft. Bowel sounds are normal.  Musculoskeletal:  No obvious bony tenderness.  Neurological:  Mumbles answes to questions  Skin: Skin is warm and dry.  Psychiatric:  demented  Nursing note and vitals reviewed.   ED Course  Procedures (including critical care time) Labs Review Labs Reviewed - No data to  display  Imaging Review No results found. I have personally reviewed and evaluated these images and lab results as part of my medical decision-making.   EKG Interpretation None      MDM   Final diagnoses:  Fall, initial encounter   Patient is in no acute distress. There is no obvious fractures or bony tenderness. No imaging necessary.    Donnetta Hutching, MD 02/15/15 [phone]

## 2015-02-15 NOTE — ED Notes (Signed)
Bed: [facility] Expected date:  Expected time:  Means of arrival:  Comments: EMS 81 yo female from [facility]/fall-no apparent injuries/c-collar

## 2015-02-15 NOTE — Consult Note (Signed)
   [facility] CM Inpatient Consult   02/15/2015  [name] 11/17/1920 [id number] Referral received for restart of services.  Call placed to the patient's son/POA, [name], [phone], HIPPA confirmed per protocol.  Mr. Harrison Mons states that he is anticipating his mother to return to [facility], likely today.  Explained [facility] Management's role and benefit of [employer] for a post hospital follow up for his mother at the facility to assist with her transition of care.  Mr. Harrison Mons declined stating, "my wife is the one who usually follows up with [employer], and I am here at the hospital with her, she said she is in touch with Candace from [facility] and would like to just keep it with her." He states he appreciate the follow up and states, "we are very pleased with the facility and the care she was getting their."  Encouraged him to call [facility] Care Management if the patient's need changes for follow up.  He states that he would and have his wife to work on things when she gets out of the hospital herself.  [facility] Care Management services were declined at this time.  Mr. Harrison Mons states he will call if Candace at [facility] can't help them.  If you have questions, please contact: Charlesetta Shanks, RN BSN CCM [facility]  [phone] business mobile phone

## 2015-02-15 NOTE — Discharge Summary (Signed)
Physician Discharge Summary  [name] XLK:[id number] DOB: 1920-08-30 DOA: 02/12/2015  PCP: No primary care provider on file.  Admit date: 02/12/2015 Discharge date: 02/15/2015  Recommendations for Outpatient Follow-up:  1. Pt will need to follow up with PCP in 2 weeks post discharge  Discharge Diagnoses:  Acute encephalopathy superimposed on dementia -worsened due to UTI - Patient presented with unwitnessed fall -02/14/15--mentation improved - Obtain TSH--3.722 -B12--507 -folate--8.1  -RPR--NR -ammonia level--27 - CT head negative for any CVA or intracranial pathology -hold hypnotic meds including klonopin, trazadone and risperdal for now -The patient can restart clonazepam and Risperdal after d/c.  Please monitor pt's mental status on these meds.  They may need to be adjusted if she becomes somnolent.  UTI - culture revealed Enterococcus sp -initially on  IV Rocephin -d/c with amoxil 500 tid x 7 days   Hypertension - Currently stable, place on hydralazine as needed with parameters   Hypothyroidism -Obtain TSH--3.722 -switch back to po synthroid   Dementia -intolerance to aricept in past -may ultimately benefit from exelon patch   Recurrent falls - PT evaluation  -The patient did remarkably well with physical therapy. Although physical therapy recommended skilled nursing facility, the patient will do better physically and socially going back to her assisted living facility given her  social situation in the setting of dementia -This was discussed with the patient's son who is agreeable Impaired glucose tolerance -Hemoglobin A1c 6.0 -Given the patient's age and comorbidities, allow for liberal glycemic control   Discharge Condition: stable  Disposition: [facility]  Diet:regular Wt Readings from Last 3 Encounters:  02/15/15 72.213 kg (159 lb 3.2 oz)  01/15/15 78.1 kg (172 lb 2.9 oz)  11/28/14 79.6 kg (175 lb 7.8 oz)    History of present  illness:  81 year old female with a history of dementia, hypertension, hypothyroidism, gait instability, frequent falls presented to the emergency department when she was found to have an unwitnessed fall at her assisted living facility, [facility]. Apparently, the patient was found on the floor. She was lethargic and not following commands. As a result, the patient was sent to the emergency department. Initial workup revealed significant pyuria, serum creatinine 0.88, WBC 11.4. The patient was initially encephalopathic. After intravenous fluids and intravenous antibiotics, the patient's mental status improved. Her mental status returned to baseline. Urine cultures grew enterococcus species. The patient was switched from ceftriaxone to amoxicillin po.  Physical therapy was consulted. Although the recommended skilled nursing facility, it was in the best interest of the patient to return back to her previous facility at [facility]. Discharge Exam: Filed Vitals:   02/15/15 0537  BP: 131/58  Pulse: 74  Temp: 98.5 F (36.9 C)  Resp: 19   Filed Vitals:   02/14/15 1215 02/14/15 1400 02/14/15 2005 02/15/15 0537  BP: 120/54 131/59 156/59 131/58  Pulse: 60 70 68 74  Temp: 97.9 F (36.6 C) 98 F (36.7 C) 98.6 F (37 C) 98.5 F (36.9 C)  TempSrc: Oral Oral Oral Oral  Resp: 20 18 18 19   Weight:    72.213 kg (159 lb 3.2 oz)  SpO2: 99% 97% 96% 94%   General: Awake and alert, NAD, pleasant, cooperative Cardiovascular: RRR, no rub, no gallop, no S3 Respiratory: CTAB, no wheeze, no rhonchi Abdomen:soft, nontender, nondistended, positive bowel sounds Extremities: No edema, No lymphangitis, no petechiae  Discharge Instructions      Discharge Instructions    Diet - low sodium heart healthy    Complete  by:  As directed      Increase activity slowly    Complete by:  As directed             Medication List    STOP taking these medications        traZODone 50 MG tablet  Commonly known  as:  DESYREL      TAKE these medications        albuterol 108 (90 BASE) MCG/ACT inhaler  Commonly known as:  PROVENTIL HFA;VENTOLIN HFA  Inhale 2 puffs into the lungs every 6 (six) hours as needed for wheezing or shortness of breath.     amoxicillin 500 MG capsule  Commonly known as:  AMOXIL  Take 1 capsule (500 mg total) by mouth every 8 (eight) hours.     aspirin 81 MG tablet  Take 81 mg by mouth daily.     clonazePAM 1 MG tablet  Commonly known as:  KLONOPIN  Take 1 mg by mouth every 4 (four) hours as needed for anxiety.     clonazePAM 0.5 MG tablet  Commonly known as:  KLONOPIN  Take 0.5 tablets (0.25 mg total) by mouth 2 (two) times daily.     levothyroxine 50 MCG tablet  Commonly known as:  SYNTHROID, LEVOTHROID  TAKE 1 TABLET (50 MCG TOTAL) BY MOUTH DAILY.     risperiDONE 1 MG tablet  Commonly known as:  RISPERDAL  Take 1 mg by mouth at bedtime.         The results of significant diagnostics from this hospitalization (including imaging, microbiology, ancillary and laboratory) are listed below for reference.    Significant Diagnostic Studies: Dg Chest 1 View  02/12/2015   CLINICAL DATA:  Patient was found outside on the ground. Patient lives in a memory care unit. Unknown if she fell from a standing position or from her wheelchair. Hematoma on the head.  EXAM: CHEST  1 VIEW  COMPARISON:  02/06/2015  FINDINGS: The heart is mildly enlarged. The lungs are clear. There are no focal consolidations or pleural effusions. There are chronic changes in the shoulders bilaterally. Moderate spondylosis identified in the thoracic spine.  IMPRESSION: Cardiomegaly.  No evidence for acute  abnormality.   Electronically Signed   By: Norva Pavlov M.D.   On: 02/12/2015 21:26   Dg Chest 2 View  02/06/2015   CLINICAL DATA:  Unwitnessed fall  EXAM: CHEST  2 VIEW  COMPARISON:  01/11/2015  FINDINGS: Chronic mild cardiomegaly. Negative aortic and hilar contours. Continued improvement in  right lung aeration with resolved right infrahilar opacity. There is no edema, consolidation, effusion, or pneumothorax.  No acute osseous findings. Rotator cuff repair with advanced glenohumeral osteoarthritis on the right.  IMPRESSION: No active cardiopulmonary disease.   Electronically Signed   By: Marnee Spring M.D.   On: 02/06/2015 10:46   Ct Head Wo Contrast  02/12/2015   CLINICAL DATA:  Unwitnessed fall. Confused patient. Hematoma to the back of the head.  EXAM: CT HEAD WITHOUT CONTRAST  CT CERVICAL SPINE WITHOUT CONTRAST  TECHNIQUE: Multidetector CT imaging of the head and cervical spine was performed following the standard protocol without intravenous contrast. Multiplanar CT image reconstructions of the cervical spine were also generated.  COMPARISON:  01/07/2015  FINDINGS: CT HEAD FINDINGS  Ventricles normal configuration. There is ventricular and sulcal enlargement reflecting moderate generalized atrophy, stable from the prior study. No hydrocephalus.  There are no parenchymal masses or mass effect. Patchy areas of white matter hypoattenuation are  appreciated consistent with mild to moderate chronic microvascular ischemic change, stable. There is no evidence of a cortical infarct.  There are no extra-axial masses or abnormal fluid collections.  There is no intracranial hemorrhage.  There is a posterior scalp hematoma. There is no underlying fracture. Visualized sinuses and mastoid air cells are clear.  CT CERVICAL SPINE FINDINGS  No fracture. No spondylolisthesis. Mild loss of disc height at C5-C6 and C6-C7. There are small endplate osteophytes from C4 through C7. Facet degenerative changes noted bilaterally, most prominent on the left at C4-C5. Soft tissues show carotid vascular calcifications but are otherwise unremarkable. There is mild scarring at the lung apices.  IMPRESSION: HEAD CT: No acute intracranial abnormalities. No skull fracture. Posterior scalp hematoma.  CERVICAL CT:  No fracture or  acute finding.   Electronically Signed   By: Amie Portland M.D.   On: 02/12/2015 21:13   Ct Cervical Spine Wo Contrast  02/12/2015   CLINICAL DATA:  Unwitnessed fall. Confused patient. Hematoma to the back of the head.  EXAM: CT HEAD WITHOUT CONTRAST  CT CERVICAL SPINE WITHOUT CONTRAST  TECHNIQUE: Multidetector CT imaging of the head and cervical spine was performed following the standard protocol without intravenous contrast. Multiplanar CT image reconstructions of the cervical spine were also generated.  COMPARISON:  01/07/2015  FINDINGS: CT HEAD FINDINGS  Ventricles normal configuration. There is ventricular and sulcal enlargement reflecting moderate generalized atrophy, stable from the prior study. No hydrocephalus.  There are no parenchymal masses or mass effect. Patchy areas of white matter hypoattenuation are appreciated consistent with mild to moderate chronic microvascular ischemic change, stable. There is no evidence of a cortical infarct.  There are no extra-axial masses or abnormal fluid collections.  There is no intracranial hemorrhage.  There is a posterior scalp hematoma. There is no underlying fracture. Visualized sinuses and mastoid air cells are clear.  CT CERVICAL SPINE FINDINGS  No fracture. No spondylolisthesis. Mild loss of disc height at C5-C6 and C6-C7. There are small endplate osteophytes from C4 through C7. Facet degenerative changes noted bilaterally, most prominent on the left at C4-C5. Soft tissues show carotid vascular calcifications but are otherwise unremarkable. There is mild scarring at the lung apices.  IMPRESSION: HEAD CT: No acute intracranial abnormalities. No skull fracture. Posterior scalp hematoma.  CERVICAL CT:  No fracture or acute finding.   Electronically Signed   By: Amie Portland M.D.   On: 02/12/2015 21:13   Dg Knee Complete 4 Views Right  02/06/2015   CLINICAL DATA:  Status post fall.  Right knee pain.  EXAM: RIGHT KNEE - COMPLETE 4+ VIEW  COMPARISON:  None.   FINDINGS: There is no evidence of fracture, dislocation, or joint effusion. There is chondrocalcinosis of the medial and lateral femorotibial compartments as can be seen with CPPD. There is mild tricompartmental osteoarthritis. There is generalized osteopenia. Soft tissues are unremarkable.  IMPRESSION: No acute osseous injury of the right knee.   Electronically Signed   By: Elige Ko   On: 02/06/2015 10:47   Dg Hip Unilat With Pelvis 2-3 Views Left  02/12/2015   CLINICAL DATA:  Pt is from [facility], memory care - Pt was outside when staff found her on the ground. Unknown if she fell from a standing position or from her wheelchair. Pupils equal and reactive. EKG showed right bundle branch block and 1st degree. Hematoma on head, no laceration or bleeding.  EXAM: DG HIP (WITH OR WITHOUT PELVIS) 2-3V LEFT  COMPARISON:  None.  FINDINGS: No acute fracture. Hip joint is normally aligned. There is bony productive change along the acetabular margins bilaterally, mostly superiorly, with mild hip joint space narrowing superior laterally.  SI joints and symphysis pubis are normally spaced and aligned.  Bones are demineralized.  Soft tissues are unremarkable.  IMPRESSION: No acute fracture or dislocation.   Electronically Signed   By: Amie Portland M.D.   On: 02/12/2015 21:25   Dg Hip Unilat With Pelvis 2-3 Views Right  02/06/2015   CLINICAL DATA:  Unwitnessed fall.  EXAM: DG HIP (WITH OR WITHOUT PELVIS) 2-3V RIGHT  COMPARISON:  None.  FINDINGS: Diffuse osteopenia and degenerative change. No acute bony or joint abnormality identified. No evidence of fracture or dislocation.  IMPRESSION: Diffuse osteopenia degenerative change.  No acute abnormality .   Electronically Signed   By: Maisie Fus  Register   On: 02/06/2015 10:48     Microbiology: Recent Results (from the past 240 hour(s))  Urine culture     Status: None   Collection Time: 02/12/15 10:54 PM  Result Value Ref Range Status   Specimen Description URINE,  CATHETERIZED  Final   Special Requests NONE  Final   Culture >=100,000 COLONIES/mL ENTEROCOCCUS SPECIES  Final   Report Status 02/15/2015 FINAL  Final   Organism ID, Bacteria ENTEROCOCCUS SPECIES  Final      Susceptibility   Enterococcus species - MIC*    AMPICILLIN <=2 SENSITIVE Sensitive     LEVOFLOXACIN 1 SENSITIVE Sensitive     NITROFURANTOIN <=16 SENSITIVE Sensitive     VANCOMYCIN 1 SENSITIVE Sensitive     LINEZOLID 2 SENSITIVE Sensitive     * >=100,000 COLONIES/mL ENTEROCOCCUS SPECIES  MRSA PCR Screening     Status: None   Collection Time: 02/13/15  2:42 AM  Result Value Ref Range Status   MRSA by PCR NEGATIVE NEGATIVE Final    Comment:        The GeneXpert MRSA Assay (FDA approved for NASAL specimens only), is one component of a comprehensive MRSA colonization surveillance program. It is not intended to diagnose MRSA infection nor to guide or monitor treatment for MRSA infections.      Labs: Basic Metabolic Panel:  Recent Labs Lab 02/12/15 2217 02/13/15 0300 02/14/15 0302 02/15/15 0330  NA 140 140 139 139  K 4.3 4.1 4.0 3.8  CL 104 105 105 106  CO2 29 28 27 26   GLUCOSE 137* 112* 102* 95  BUN 17 17 11 11   CREATININE 0.96 0.88 0.79 0.75  CALCIUM 9.0 9.1 8.7* 8.7*   Liver Function Tests:  Recent Labs Lab 02/13/15 0300  AST 18  ALT 16  ALKPHOS 73  BILITOT 0.5  PROT 5.9*  ALBUMIN 3.1*   No results for input(s): LIPASE, AMYLASE in the last 168 hours.  Recent Labs Lab 02/13/15 1122  AMMONIA 27   CBC:  Recent Labs Lab 02/12/15 2217 02/13/15 0300 02/14/15 0302  WBC 11.4* 10.9* 6.9  NEUTROABS 9.4*  --   --   HGB 10.9* 11.1* 10.5*  HCT 33.0* 34.0* 32.7*  MCV 88.7 89.7 88.9  PLT 208 219 228   Cardiac Enzymes:  Recent Labs Lab 02/12/15 2217  CKTOTAL 50   BNP: Invalid input(s): POCBNP CBG:  Recent Labs Lab 02/12/15 2036  GLUCAP 122*    Time coordinating discharge:  Greater than 30 minutes  Signed:  Miryam Mcelhinney, DO [facility] Pager: [phone] 02/15/2015, 11:29 AM

## 2015-02-15 NOTE — Discharge Instructions (Signed)
No bony tenderness identified. Follow-up your primary care doctor

## 2015-02-15 NOTE — ED Notes (Signed)
Pt is presneted from [facility] after a witnessed fall. Medics report that facility sent her out for a post fall evaluation, pt is not c/o any pain, passed medics spine screen.

## 2015-02-16 DIAGNOSIS — F919 Conduct disorder, unspecified: Secondary | ICD-10-CM | POA: Diagnosis not present

## 2015-02-17 ENCOUNTER — Emergency Department (HOSPITAL_COMMUNITY)
Admission: EM | Admit: 2015-02-17 | Discharge: 2015-02-18 | Disposition: A | Discharge status: Home / Self Care | Payer: Commercial Managed Care - HMO | Attending: Emergency Medicine | Admitting: Emergency Medicine

## 2015-02-17 ENCOUNTER — Encounter (HOSPITAL_COMMUNITY): Payer: Self-pay | Admitting: Nurse Practitioner

## 2015-02-17 DIAGNOSIS — E039 Hypothyroidism, unspecified: Secondary | ICD-10-CM | POA: Insufficient documentation

## 2015-02-17 DIAGNOSIS — S0003XA Contusion of scalp, initial encounter: Secondary | ICD-10-CM | POA: Diagnosis not present

## 2015-02-17 DIAGNOSIS — Y998 Other external cause status: Secondary | ICD-10-CM | POA: Insufficient documentation

## 2015-02-17 DIAGNOSIS — Z8701 Personal history of pneumonia (recurrent): Secondary | ICD-10-CM | POA: Diagnosis not present

## 2015-02-17 DIAGNOSIS — Z79899 Other long term (current) drug therapy: Secondary | ICD-10-CM | POA: Insufficient documentation

## 2015-02-17 DIAGNOSIS — F419 Anxiety disorder, unspecified: Secondary | ICD-10-CM | POA: Diagnosis not present

## 2015-02-17 DIAGNOSIS — S0990XA Unspecified injury of head, initial encounter: Secondary | ICD-10-CM | POA: Diagnosis not present

## 2015-02-17 DIAGNOSIS — Z87891 Personal history of nicotine dependence: Secondary | ICD-10-CM | POA: Insufficient documentation

## 2015-02-17 DIAGNOSIS — S161XXA Strain of muscle, fascia and tendon at neck level, initial encounter: Secondary | ICD-10-CM | POA: Insufficient documentation

## 2015-02-17 DIAGNOSIS — M1990 Unspecified osteoarthritis, unspecified site: Secondary | ICD-10-CM | POA: Diagnosis not present

## 2015-02-17 DIAGNOSIS — Z862 Personal history of diseases of the blood and blood-forming organs and certain disorders involving the immune mechanism: Secondary | ICD-10-CM | POA: Insufficient documentation

## 2015-02-17 DIAGNOSIS — F0390 Unspecified dementia without behavioral disturbance: Secondary | ICD-10-CM | POA: Diagnosis not present

## 2015-02-17 DIAGNOSIS — Y9389 Activity, other specified: Secondary | ICD-10-CM | POA: Insufficient documentation

## 2015-02-17 DIAGNOSIS — R011 Cardiac murmur, unspecified: Secondary | ICD-10-CM | POA: Diagnosis not present

## 2015-02-17 DIAGNOSIS — Z043 Encounter for examination and observation following other accident: Secondary | ICD-10-CM | POA: Diagnosis not present

## 2015-02-17 DIAGNOSIS — I129 Hypertensive chronic kidney disease with stage 1 through stage 4 chronic kidney disease, or unspecified chronic kidney disease: Secondary | ICD-10-CM | POA: Diagnosis not present

## 2015-02-17 DIAGNOSIS — Z87440 Personal history of urinary (tract) infections: Secondary | ICD-10-CM | POA: Insufficient documentation

## 2015-02-17 DIAGNOSIS — Z86010 Personal history of colonic polyps: Secondary | ICD-10-CM | POA: Diagnosis not present

## 2015-02-17 DIAGNOSIS — N189 Chronic kidney disease, unspecified: Secondary | ICD-10-CM | POA: Diagnosis not present

## 2015-02-17 DIAGNOSIS — M542 Cervicalgia: Secondary | ICD-10-CM | POA: Diagnosis not present

## 2015-02-17 DIAGNOSIS — F329 Major depressive disorder, single episode, unspecified: Secondary | ICD-10-CM | POA: Insufficient documentation

## 2015-02-17 DIAGNOSIS — Z7982 Long term (current) use of aspirin: Secondary | ICD-10-CM | POA: Insufficient documentation

## 2015-02-17 DIAGNOSIS — Y9289 Other specified places as the place of occurrence of the external cause: Secondary | ICD-10-CM | POA: Insufficient documentation

## 2015-02-17 DIAGNOSIS — W19XXXA Unspecified fall, initial encounter: Secondary | ICD-10-CM | POA: Insufficient documentation

## 2015-02-17 DIAGNOSIS — Z8709 Personal history of other diseases of the respiratory system: Secondary | ICD-10-CM | POA: Insufficient documentation

## 2015-02-17 DIAGNOSIS — Z8619 Personal history of other infectious and parasitic diseases: Secondary | ICD-10-CM | POA: Insufficient documentation

## 2015-02-17 DIAGNOSIS — T148 Other injury of unspecified body region: Secondary | ICD-10-CM | POA: Diagnosis not present

## 2015-02-17 NOTE — ED Notes (Signed)
Bed: ZO10 Expected date:  Expected time:  Means of arrival:  Comments: [id number]

## 2015-02-17 NOTE — ED Notes (Signed)
Patient is from [facility] and was sent out today for unwitnessed fall. Patient was found on the floor by nursing staff. Patient has no obvious, injuries, or bruising. Patient has history of neck pain. Patient was able to bear weight and move all extremities upon EMS arrival. Patient is asleep and immobilized.

## 2015-02-17 NOTE — ED Provider Notes (Signed)
CSN: [id number]     Arrival date & time 02/17/15  2248 History   This chart was scribed for Deborah Lyons, MD by [name], ED Scribe. This patient was seen in room WA19/WA19 and the patient's care was started at 11:48 PM.     Chief Complaint  Patient presents with  . Fall   Patient is a 81 y.o. female presenting with fall. The history is provided by the patient. No language interpreter was used.  Fall   HPI Comments: Level 5 caveat: Fall and Dementia  [name] is a 81 y.o. female with PMHx listed below brought in by ambulance, who presents to the Emergency Department complaining of fall onset PTA. Pt presents from nursing staff after an unwitnessed fall. Pt is in c-collar and only states that she wants the c-collar and off and she is not sure what happened tonight.    Past Medical History  Diagnosis Date  . Arthritis   . Chicken pox   . Chronic kidney disease   . Colon polyps   . Hypertension   . Dementia   . Angina   . Dysrhythmia   . Heart murmur   . Shortness of breath   . Recurrent upper respiratory infection (URI)   . Pneumonia   . Hypothyroidism   . Anemia   . Headache(784.0)   . Anxiety   . Depression   . Valvular heart disease 09/04/2012  . Left leg pain 10/02/2012  . UTI (urinary tract infection) 12/30/2012  . Acute bronchitis 04/10/2013  . Dehydration 08/31/2013  . Arthritis 10/02/2012   Past Surgical History  Procedure Laterality Date  . Appendectomy    . Cholecystectomy    . Kidney stones    . Tonsillectomy    . Abdominal hysterectomy    . Hand surgery    . Feet surgery    . Shoulder arthroscopy    . Back surgery    . Tonsillectomy    . Eye surgery      cataract removed and eye lids lifted  . Fracture surgery      bilateral arms  . Tubal ligation    . Colonoscopy w/ polypectomy     Family History  Problem Relation Age of Onset  . Heart disease Mother   . Heart disease Father   . Heart disease Other   . Birth defects Other    Social  History  Substance Use Topics  . Smoking status: Former Smoker -- 0.20 packs/day for 1 years    Types: Cigarettes    Quit date: 06/02/1941  . Smokeless tobacco: Never Used  . Alcohol Use: No   OB History    No data available     Review of Systems  Unable to perform ROS: Other     Allergies  Morphine and related; Codeine; and Hydrocodone  Home Medications   Prior to Admission medications   Medication Sig Start Date End Date Taking? Authorizing Provider  amoxicillin (AMOXIL) 500 MG capsule Take 1 capsule (500 mg total) by mouth every 8 (eight) hours. 02/15/15  Yes Deborah Hartshorn, MD  aspirin 81 MG tablet Take 81 mg by mouth daily.   Yes Historical Provider, MD  clonazePAM (KLONOPIN) 0.5 MG tablet Take 0.5 tablets (0.25 mg total) by mouth 2 (two) times daily. 01/16/15  Yes Deborah Ide, MD  clonazePAM (KLONOPIN) 1 MG tablet Take 1 mg by mouth every 4 (four) hours as needed for anxiety.   Yes Historical Provider, MD  levothyroxine (SYNTHROID, LEVOTHROID) 50 MCG tablet TAKE 1 TABLET (50 MCG TOTAL) BY MOUTH DAILY. 09/25/14  Yes Deborah Bonus, MD  risperiDONE (RISPERDAL) 1 MG tablet Take 1 mg by mouth at bedtime.   Yes Historical Provider, MD  rivastigmine (EXELON) 1.5 MG capsule Take 1.5 mg by mouth 2 (two) times daily.   Yes Historical Provider, MD  traZODone (DESYREL) 50 MG tablet Take 50 mg by mouth at bedtime. 01/16/15  Yes Historical Provider, MD  albuterol (PROVENTIL HFA;VENTOLIN HFA) 108 (90 BASE) MCG/ACT inhaler Inhale 2 puffs into the lungs every 6 (six) hours as needed for wheezing or shortness of breath. 09/23/13   Deborah Craze, NP   BP 130/52 mmHg  Pulse 62  Temp(Src) 97.2 F (36.2 C) (Axillary)  Resp 14  SpO2 95%   Physical Exam  Constitutional: She is oriented to person, place, and time. She appears well-developed and well-nourished. No distress. Cervical collar in place.  HENT:  Head: Normocephalic and atraumatic.  Eyes: Conjunctivae and EOM are normal.  Neck:  Neck supple. No tracheal deviation present.  Cardiovascular: Normal rate.   Pulmonary/Chest: Effort normal. No respiratory distress.  Musculoskeletal: Normal range of motion.  Neurological: She is alert and oriented to person, place, and time.  Pt is unable to provide Hx due to dementia. She moves all extremities and follows commands appropriately.   Skin: Skin is warm and dry.  Psychiatric: She has a normal mood and affect. Her behavior is normal.  Nursing note and vitals reviewed.   ED Course  Procedures (including critical care time) DIAGNOSTIC STUDIES: Oxygen Saturation is 958% on RA, adequate by my interpretation.    COORDINATION OF CARE:     Labs Review Labs Reviewed - No data to display  Imaging Review No results found. I have personally reviewed and evaluated these images and lab results as part of my medical decision-making.   EKG Interpretation None      MDM   Final diagnoses:  None      CT scan of the head and cervical spine are negative. I see no other obvious injury and believe she is appropriate for discharge. To return as needed for any problems.   I personally performed the services described in this documentation, which was scribed in my presence. The recorded information has been reviewed and is accurate.      Deborah Lyons, MD 02/18/15 [phone]

## 2015-02-18 ENCOUNTER — Emergency Department (HOSPITAL_COMMUNITY): Payer: Commercial Managed Care - HMO

## 2015-02-18 ENCOUNTER — Other Ambulatory Visit (HOSPITAL_COMMUNITY): Payer: Self-pay

## 2015-02-18 DIAGNOSIS — N189 Chronic kidney disease, unspecified: Secondary | ICD-10-CM | POA: Diagnosis not present

## 2015-02-18 DIAGNOSIS — D649 Anemia, unspecified: Secondary | ICD-10-CM | POA: Diagnosis not present

## 2015-02-18 DIAGNOSIS — R011 Cardiac murmur, unspecified: Secondary | ICD-10-CM | POA: Diagnosis not present

## 2015-02-18 DIAGNOSIS — M1990 Unspecified osteoarthritis, unspecified site: Secondary | ICD-10-CM | POA: Diagnosis not present

## 2015-02-18 DIAGNOSIS — S0003XA Contusion of scalp, initial encounter: Secondary | ICD-10-CM | POA: Diagnosis not present

## 2015-02-18 DIAGNOSIS — S0990XA Unspecified injury of head, initial encounter: Secondary | ICD-10-CM | POA: Diagnosis not present

## 2015-02-18 DIAGNOSIS — F0390 Unspecified dementia without behavioral disturbance: Secondary | ICD-10-CM | POA: Diagnosis not present

## 2015-02-18 DIAGNOSIS — S161XXA Strain of muscle, fascia and tendon at neck level, initial encounter: Secondary | ICD-10-CM | POA: Diagnosis not present

## 2015-02-18 DIAGNOSIS — I129 Hypertensive chronic kidney disease with stage 1 through stage 4 chronic kidney disease, or unspecified chronic kidney disease: Secondary | ICD-10-CM | POA: Diagnosis not present

## 2015-02-18 DIAGNOSIS — Z043 Encounter for examination and observation following other accident: Secondary | ICD-10-CM | POA: Diagnosis not present

## 2015-02-18 DIAGNOSIS — F419 Anxiety disorder, unspecified: Secondary | ICD-10-CM | POA: Diagnosis not present

## 2015-02-18 DIAGNOSIS — E039 Hypothyroidism, unspecified: Secondary | ICD-10-CM | POA: Diagnosis not present

## 2015-02-18 NOTE — Discharge Instructions (Signed)
Fall Prevention in Hospitals °As a hospital patient, your condition and the treatments you receive can increase your risk for falls. Some additional risk factors for falls in a hospital include: °· Being in an unfamiliar environment. °· Being on bed rest. °· Your surgery. °· Taking certain medicines. °· Your tubing requirements, such as intravenous (IV) therapy or catheters. °It is important that you learn how to decrease fall risks while at the hospital. Below are important tips that can help prevent falls. °SAFETY TIPS FOR PREVENTING FALLS °Talk about your risk of falling. °· Ask your caregiver why you are at risk for falling. Is it your medicine, illness, tubing placement, or something else? °· Make a plan with your caregiver to keep you safe from falls. °· Ask your caregiver or pharmacist about side effect of your medicines. Some medicines can make you dizzy or affect your coordination. °Ask for help. °· Ask for help before getting out of bed. You may need to press your call button. °· Ask for assistance in getting you safely to the toilet. °· Ask for a walker or cane to be put at your bedside. Ask that most of the side rails on your bed be placed up before your caregiver leaves the room. °· Ask family or friends to sit with you. °· Ask for things that are out of your reach, such as your glasses, hearing aids, telephone, bedside table, or call button. °Follow these tips to avoid falling: °· Stay lying or seated, rather than standing, while waiting for help. °· Wear rubber-soled slippers or shoes whenever you walk in the hospital. °· Avoid quick, sudden movements. °¨ Change positions slowly. °¨ Sit on the side of your bed before standing. °¨ Stand up slowly and wait before you start to walk. °· Let your caregiver know if there is a spill on the floor. °· Pay careful attention to the medical equipment, electrical cords, and tubes around you. °· When you need help, use your call button by your bed or in the  bathroom. Wait for one of your caregivers to help you. °· If you feel dizzy or unsure of your footing, return to bed and wait for assistance. °· Avoid being distracted by the TV, telephone, or another person in your room. °· Do not lean or support yourself on rolling objects, such as IV poles or bedside tables. °Document Released: 05/16/2000 Document Revised: 05/05/2012 Document Reviewed: 01/25/2012 °Exit[employer]® Patient Information ©2015 [employer]. This information is not intended to replace advice given to you by your health care provider. Make sure you discuss any questions you have with your health care provider. ° °

## 2015-02-19 DIAGNOSIS — G0430 Acute necrotizing hemorrhagic encephalopathy, unspecified: Secondary | ICD-10-CM | POA: Diagnosis not present

## 2015-02-20 DIAGNOSIS — M6281 Muscle weakness (generalized): Secondary | ICD-10-CM | POA: Diagnosis not present

## 2015-02-20 DIAGNOSIS — F0281 Dementia in other diseases classified elsewhere with behavioral disturbance: Secondary | ICD-10-CM | POA: Diagnosis not present

## 2015-02-20 DIAGNOSIS — G309 Alzheimer's disease, unspecified: Secondary | ICD-10-CM | POA: Diagnosis not present

## 2015-02-20 DIAGNOSIS — G0430 Acute necrotizing hemorrhagic encephalopathy, unspecified: Secondary | ICD-10-CM | POA: Diagnosis not present

## 2015-02-20 DIAGNOSIS — R634 Abnormal weight loss: Secondary | ICD-10-CM | POA: Diagnosis not present

## 2015-02-20 DIAGNOSIS — N390 Urinary tract infection, site not specified: Secondary | ICD-10-CM | POA: Diagnosis not present

## 2015-02-20 DIAGNOSIS — R296 Repeated falls: Secondary | ICD-10-CM | POA: Diagnosis not present

## 2015-02-20 DIAGNOSIS — I10 Essential (primary) hypertension: Secondary | ICD-10-CM | POA: Diagnosis not present

## 2015-02-21 DIAGNOSIS — G0430 Acute necrotizing hemorrhagic encephalopathy, unspecified: Secondary | ICD-10-CM | POA: Diagnosis not present

## 2015-02-22 DIAGNOSIS — G0430 Acute necrotizing hemorrhagic encephalopathy, unspecified: Secondary | ICD-10-CM | POA: Diagnosis not present

## 2015-02-23 DIAGNOSIS — G0430 Acute necrotizing hemorrhagic encephalopathy, unspecified: Secondary | ICD-10-CM | POA: Diagnosis not present

## 2015-02-24 DIAGNOSIS — G0430 Acute necrotizing hemorrhagic encephalopathy, unspecified: Secondary | ICD-10-CM | POA: Diagnosis not present

## 2015-02-25 DIAGNOSIS — G0430 Acute necrotizing hemorrhagic encephalopathy, unspecified: Secondary | ICD-10-CM | POA: Diagnosis not present

## 2015-02-26 DIAGNOSIS — G0430 Acute necrotizing hemorrhagic encephalopathy, unspecified: Secondary | ICD-10-CM | POA: Diagnosis not present

## 2015-02-27 DIAGNOSIS — G0430 Acute necrotizing hemorrhagic encephalopathy, unspecified: Secondary | ICD-10-CM | POA: Diagnosis not present

## 2015-02-27 DIAGNOSIS — G309 Alzheimer's disease, unspecified: Secondary | ICD-10-CM | POA: Diagnosis not present

## 2015-02-27 DIAGNOSIS — R296 Repeated falls: Secondary | ICD-10-CM | POA: Diagnosis not present

## 2015-02-27 DIAGNOSIS — N390 Urinary tract infection, site not specified: Secondary | ICD-10-CM | POA: Diagnosis not present

## 2015-02-27 DIAGNOSIS — M6281 Muscle weakness (generalized): Secondary | ICD-10-CM | POA: Diagnosis not present

## 2015-02-27 DIAGNOSIS — F0281 Dementia in other diseases classified elsewhere with behavioral disturbance: Secondary | ICD-10-CM | POA: Diagnosis not present

## 2015-02-28 DIAGNOSIS — G0430 Acute necrotizing hemorrhagic encephalopathy, unspecified: Secondary | ICD-10-CM | POA: Diagnosis not present

## 2015-03-01 DIAGNOSIS — R1312 Dysphagia, oropharyngeal phase: Secondary | ICD-10-CM | POA: Diagnosis not present

## 2015-03-01 DIAGNOSIS — M6281 Muscle weakness (generalized): Secondary | ICD-10-CM | POA: Diagnosis not present

## 2015-03-01 DIAGNOSIS — R278 Other lack of coordination: Secondary | ICD-10-CM | POA: Diagnosis not present

## 2015-03-01 DIAGNOSIS — G308 Other Alzheimer's disease: Secondary | ICD-10-CM | POA: Diagnosis not present

## 2015-03-01 DIAGNOSIS — G0430 Acute necrotizing hemorrhagic encephalopathy, unspecified: Secondary | ICD-10-CM | POA: Diagnosis not present

## 2015-03-02 DIAGNOSIS — G308 Other Alzheimer's disease: Secondary | ICD-10-CM | POA: Diagnosis not present

## 2015-03-02 DIAGNOSIS — R278 Other lack of coordination: Secondary | ICD-10-CM | POA: Diagnosis not present

## 2015-03-02 DIAGNOSIS — R1312 Dysphagia, oropharyngeal phase: Secondary | ICD-10-CM | POA: Diagnosis not present

## 2015-03-02 DIAGNOSIS — G0430 Acute necrotizing hemorrhagic encephalopathy, unspecified: Secondary | ICD-10-CM | POA: Diagnosis not present

## 2015-03-02 DIAGNOSIS — M6281 Muscle weakness (generalized): Secondary | ICD-10-CM | POA: Diagnosis not present

## 2015-03-03 DIAGNOSIS — G0430 Acute necrotizing hemorrhagic encephalopathy, unspecified: Secondary | ICD-10-CM | POA: Diagnosis not present

## 2015-03-04 DIAGNOSIS — G0430 Acute necrotizing hemorrhagic encephalopathy, unspecified: Secondary | ICD-10-CM | POA: Diagnosis not present

## 2015-03-05 DIAGNOSIS — M6281 Muscle weakness (generalized): Secondary | ICD-10-CM | POA: Diagnosis not present

## 2015-03-05 DIAGNOSIS — G0430 Acute necrotizing hemorrhagic encephalopathy, unspecified: Secondary | ICD-10-CM | POA: Diagnosis not present

## 2015-03-05 DIAGNOSIS — R1312 Dysphagia, oropharyngeal phase: Secondary | ICD-10-CM | POA: Diagnosis not present

## 2015-03-05 DIAGNOSIS — R278 Other lack of coordination: Secondary | ICD-10-CM | POA: Diagnosis not present

## 2015-03-06 ENCOUNTER — Emergency Department (HOSPITAL_COMMUNITY): Payer: Commercial Managed Care - HMO

## 2015-03-06 ENCOUNTER — Encounter (HOSPITAL_COMMUNITY): Payer: Self-pay

## 2015-03-06 ENCOUNTER — Emergency Department (HOSPITAL_COMMUNITY)
Admission: EM | Admit: 2015-03-06 | Discharge: 2015-03-06 | Disposition: A | Discharge status: Nursing Home (Includes ICF) | Payer: Commercial Managed Care - HMO | Attending: Emergency Medicine | Admitting: Emergency Medicine

## 2015-03-06 DIAGNOSIS — Y92129 Unspecified place in nursing home as the place of occurrence of the external cause: Secondary | ICD-10-CM | POA: Insufficient documentation

## 2015-03-06 DIAGNOSIS — Z862 Personal history of diseases of the blood and blood-forming organs and certain disorders involving the immune mechanism: Secondary | ICD-10-CM | POA: Insufficient documentation

## 2015-03-06 DIAGNOSIS — M6281 Muscle weakness (generalized): Secondary | ICD-10-CM | POA: Diagnosis not present

## 2015-03-06 DIAGNOSIS — N189 Chronic kidney disease, unspecified: Secondary | ICD-10-CM | POA: Diagnosis not present

## 2015-03-06 DIAGNOSIS — M79601 Pain in right arm: Secondary | ICD-10-CM | POA: Diagnosis not present

## 2015-03-06 DIAGNOSIS — Z79899 Other long term (current) drug therapy: Secondary | ICD-10-CM | POA: Insufficient documentation

## 2015-03-06 DIAGNOSIS — Z86010 Personal history of colonic polyps: Secondary | ICD-10-CM | POA: Insufficient documentation

## 2015-03-06 DIAGNOSIS — R011 Cardiac murmur, unspecified: Secondary | ICD-10-CM | POA: Diagnosis not present

## 2015-03-06 DIAGNOSIS — M25521 Pain in right elbow: Secondary | ICD-10-CM | POA: Diagnosis not present

## 2015-03-06 DIAGNOSIS — I129 Hypertensive chronic kidney disease with stage 1 through stage 4 chronic kidney disease, or unspecified chronic kidney disease: Secondary | ICD-10-CM | POA: Diagnosis not present

## 2015-03-06 DIAGNOSIS — M1990 Unspecified osteoarthritis, unspecified site: Secondary | ICD-10-CM | POA: Diagnosis not present

## 2015-03-06 DIAGNOSIS — M79641 Pain in right hand: Secondary | ICD-10-CM | POA: Diagnosis not present

## 2015-03-06 DIAGNOSIS — E039 Hypothyroidism, unspecified: Secondary | ICD-10-CM | POA: Diagnosis not present

## 2015-03-06 DIAGNOSIS — F419 Anxiety disorder, unspecified: Secondary | ICD-10-CM | POA: Insufficient documentation

## 2015-03-06 DIAGNOSIS — Z87891 Personal history of nicotine dependence: Secondary | ICD-10-CM | POA: Insufficient documentation

## 2015-03-06 DIAGNOSIS — F329 Major depressive disorder, single episode, unspecified: Secondary | ICD-10-CM | POA: Diagnosis not present

## 2015-03-06 DIAGNOSIS — F0390 Unspecified dementia without behavioral disturbance: Secondary | ICD-10-CM | POA: Diagnosis not present

## 2015-03-06 DIAGNOSIS — G0430 Acute necrotizing hemorrhagic encephalopathy, unspecified: Secondary | ICD-10-CM | POA: Diagnosis not present

## 2015-03-06 DIAGNOSIS — J45909 Unspecified asthma, uncomplicated: Secondary | ICD-10-CM | POA: Insufficient documentation

## 2015-03-06 DIAGNOSIS — S59901A Unspecified injury of right elbow, initial encounter: Secondary | ICD-10-CM | POA: Insufficient documentation

## 2015-03-06 DIAGNOSIS — Z7982 Long term (current) use of aspirin: Secondary | ICD-10-CM | POA: Insufficient documentation

## 2015-03-06 DIAGNOSIS — G309 Alzheimer's disease, unspecified: Secondary | ICD-10-CM | POA: Diagnosis not present

## 2015-03-06 DIAGNOSIS — Y998 Other external cause status: Secondary | ICD-10-CM | POA: Diagnosis not present

## 2015-03-06 DIAGNOSIS — Z8619 Personal history of other infectious and parasitic diseases: Secondary | ICD-10-CM | POA: Insufficient documentation

## 2015-03-06 DIAGNOSIS — Y9389 Activity, other specified: Secondary | ICD-10-CM | POA: Insufficient documentation

## 2015-03-06 DIAGNOSIS — Z8701 Personal history of pneumonia (recurrent): Secondary | ICD-10-CM | POA: Insufficient documentation

## 2015-03-06 DIAGNOSIS — R296 Repeated falls: Secondary | ICD-10-CM | POA: Diagnosis not present

## 2015-03-06 DIAGNOSIS — Z87440 Personal history of urinary (tract) infections: Secondary | ICD-10-CM | POA: Insufficient documentation

## 2015-03-06 HISTORY — DX: Unspecified asthma, uncomplicated: J45.909

## 2015-03-06 NOTE — ED Notes (Signed)
Called [facility] for transport back to facility on [address]  [phone]

## 2015-03-06 NOTE — ED Notes (Signed)
[address] [place], [phone], phone rang x 6, no answer.

## 2015-03-06 NOTE — ED Notes (Signed)
Bed: WA17 Expected date:  Expected time:  Means of arrival:  Comments: EMS fall 

## 2015-03-06 NOTE — ED Notes (Signed)
2nd attempt to notify [facility] without success of pt's d/c.  Phone rang x 6.

## 2015-03-06 NOTE — ED Notes (Signed)
Per [facility], picked up at [facility] off [address] pt slid out of chair, denies, LOC, did not hit head, is on no blood thinners, c/o right elbow pain.

## 2015-03-06 NOTE — ED Provider Notes (Signed)
CSN: [id number]     Arrival date & time 03/06/15  4098 History   First MD Initiated Contact with Patient 03/06/15 [phone]     Chief Complaint  Patient presents with  . Elbow Injury    right     (Consider location/radiation/quality/duration/timing/severity/associated sxs/prior Treatment) HPI Comments: 81 year old female presents via EMS from her skilled nursing facility. LEVEL 5  CAVEAT DUE TO DEMENTIA Patient had a witnessed fall today. She slid out of her wheelchair and banged her right elbow. She did not hit her head or lose consciousness. She is not taking any blood thinners. Patient complains of right elbow pain.    Patient is a 81 y.o. female presenting with fall.  Fall This is a new problem. The current episode started today. The problem occurs intermittently. Associated symptoms comments: R elbow pain .    Past Medical History  Diagnosis Date  . Arthritis   . Chicken pox   . Chronic kidney disease   . Colon polyps   . Hypertension   . Dementia   . Angina   . Dysrhythmia   . Heart murmur   . Shortness of breath   . Recurrent upper respiratory infection (URI)   . Pneumonia   . Hypothyroidism   . Anemia   . Headache(784.0)   . Anxiety   . Depression   . Valvular heart disease 09/04/2012  . Left leg pain 10/02/2012  . UTI (urinary tract infection) 12/30/2012  . Acute bronchitis 04/10/2013  . Dehydration 08/31/2013  . Arthritis 10/02/2012  . Asthma    Past Surgical History  Procedure Laterality Date  . Appendectomy    . Cholecystectomy    . Kidney stones    . Tonsillectomy    . Abdominal hysterectomy    . Hand surgery    . Feet surgery    . Shoulder arthroscopy    . Back surgery    . Tonsillectomy    . Eye surgery      cataract removed and eye lids lifted  . Fracture surgery      bilateral arms  . Tubal ligation    . Colonoscopy w/ polypectomy     Family History  Problem Relation Age of Onset  . Heart disease Mother   . Heart disease Father   . Heart  disease Other   . Birth defects Other    Social History  Substance Use Topics  . Smoking status: Former Smoker -- 0.20 packs/day for 1 years    Types: Cigarettes    Quit date: 06/02/1941  . Smokeless tobacco: Never Used  . Alcohol Use: No   OB History    No data available     Review of Systems  Unable to perform ROS     Allergies  Morphine and related; Codeine; and Hydrocodone  Home Medications   Prior to Admission medications   Medication Sig Start Date End Date Taking? Authorizing Provider  acetaminophen (TYLENOL) 500 MG tablet Take 500 mg by mouth every 4 (four) hours as needed for mild pain, fever or headache.   Yes Historical Provider, MD  albuterol (PROVENTIL HFA;VENTOLIN HFA) 108 (90 BASE) MCG/ACT inhaler Inhale 2 puffs into the lungs every 6 (six) hours as needed for wheezing or shortness of breath. 09/23/13  Yes Sandford Craze, NP  alum & mag hydroxide-simeth (GERI-LANTA) 200-200-20 MG/5ML suspension Take 30 mLs by mouth as needed for indigestion.   Yes Historical Provider, MD  aspirin 81 MG chewable tablet Chew 81 mg by mouth  daily.   Yes Historical Provider, MD  clonazePAM (KLONOPIN) 0.5 MG tablet Take 0.5 tablets (0.25 mg total) by mouth 2 (two) times daily. Patient taking differently: Take 0.5 mg by mouth 2 (two) times daily.  01/16/15  Yes Meredeth Ide, MD  clonazePAM (KLONOPIN) 0.5 MG tablet Take 0.5 mg by mouth every 4 (four) hours as needed for anxiety (and agitation).   Yes Historical Provider, MD  guaiFENesin (Q-TUSSIN) 100 MG/5ML liquid Take 200 mg by mouth every 6 (six) hours as needed for cough.   Yes Historical Provider, MD  levothyroxine (SYNTHROID, LEVOTHROID) 50 MCG tablet TAKE 1 TABLET (50 MCG TOTAL) BY MOUTH DAILY. 09/25/14  Yes Myrlene Broker, MD  loperamide (IMODIUM) 2 MG capsule Take 2 mg by mouth as needed for diarrhea or loose stools.   Yes Historical Provider, MD  magnesium hydroxide (MILK OF MAGNESIA) 400 MG/5ML suspension Take 30 mLs by  mouth at bedtime as needed for mild constipation.   Yes Historical Provider, MD  neomycin-bacitracin-polymyxin (NEOSPORIN) ointment Apply 1 application topically as needed for wound care.   Yes Historical Provider, MD  Nutritional Supplements (NUTRA SHAKE PO) Take 1 Bottle by mouth 3 (three) times daily. *Mighty Shakes*   Yes Historical Provider, MD  risperiDONE (RISPERDAL) 1 MG tablet Take 1 mg by mouth at bedtime.   Yes Historical Provider, MD  rivastigmine (EXELON) 1.5 MG capsule Take 1.5 mg by mouth 2 (two) times daily.   Yes Historical Provider, MD  amoxicillin (AMOXIL) 500 MG capsule Take 1 capsule (500 mg total) by mouth every 8 (eight) hours. Patient not taking: Reported on 03/06/2015 02/15/15   Catarina Hartshorn, MD   BP 119/63 mmHg  Pulse 60  Temp(Src) 97.5 F (36.4 C) (Oral)  Resp 16  SpO2 96% Physical Exam  Constitutional: She is oriented to person, place, and time. She appears well-developed and well-nourished. No distress.  HENT:  Head: Normocephalic and atraumatic.  Eyes: Conjunctivae are normal. No scleral icterus.  Neck: Normal range of motion.  Cardiovascular: Normal rate, regular rhythm and normal heart sounds.  Exam reveals no gallop and no friction rub.   No murmur heard. Pulmonary/Chest: Effort normal and breath sounds normal. No respiratory distress.  Abdominal: Soft. Bowel sounds are normal. She exhibits no distension and no mass. There is no tenderness. There is no guarding.  Musculoskeletal:  Right elbow with no bony tenderness, deformity, bruising, swelling. She has full range of motion with flexion, extension, supination and pronation without pain. Distal pulses and sensation intact  Neurological: She is alert and oriented to person, place, and time.  Skin: Skin is warm and dry. She is not diaphoretic.    ED Course  Procedures (including critical care time) Labs Review Labs Reviewed - No data to display  Imaging Review Dg Elbow Complete Right  03/06/2015    CLINICAL DATA:  Right elbow pain.  Good range of motion.  EXAM: RIGHT ELBOW - COMPLETE 3+ VIEW  COMPARISON:  None.  FINDINGS: There is no evidence of fracture, dislocation, or joint effusion. There is no evidence of arthropathy or other focal bone abnormality. Soft tissues are unremarkable.  IMPRESSION: No acute osseous injury of the right elbow.   Electronically Signed   By: Elige Ko   On: 03/06/2015 09:38   I have personally reviewed and evaluated these images and lab results as part of my medical decision-making.   EKG Interpretation None      MDM   Final diagnoses:  Elbow pain, right  Patient X-Ray negative for obvious fracture or dislocation. Patient will be dc home & is agreeable with above plan. Follow with PCP. Patient seen in shared visit with attending physician.     Arthor Captain, PA-C 03/06/15 1024  Azalia Bilis, MD 03/06/15 1043
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
## 2015-03-06 NOTE — ED Notes (Signed)
[facility] here for transport.  Informed 2 attempts to call facility.  All paperwork from facility given to [facility].

## 2015-03-06 NOTE — Discharge Instructions (Signed)
Patient evaluated. No acute injuries. Fall Prevention in Hospitals As a hospital patient, your condition and the treatments you receive can increase your risk for falls. Some additional risk factors for falls in a hospital include:  Being in an unfamiliar environment.  Being on bed rest.  Your surgery.  Taking certain medicines.  Your tubing requirements, such as intravenous (IV) therapy or catheters. It is important that you learn how to decrease fall risks while at the hospital. Below are important tips that can help prevent falls. SAFETY TIPS FOR PREVENTING FALLS Talk about your risk of falling.  Ask your caregiver why you are at risk for falling. Is it your medicine, illness, tubing placement, or something else?  Make a plan with your caregiver to keep you safe from falls.  Ask your caregiver or pharmacist about side effect of your medicines. Some medicines can make you dizzy or affect your coordination. Ask for help.  Ask for help before getting out of bed. You may need to press your call button.  Ask for assistance in getting you safely to the toilet.  Ask for a walker or cane to be put at your bedside. Ask that most of the side rails on your bed be placed up before your caregiver leaves the room.  Ask family or friends to sit with you.  Ask for things that are out of your reach, such as your glasses, hearing aids, telephone, bedside table, or call button. Follow these tips to avoid falling:  Stay lying or seated, rather than standing, while waiting for help.  Wear rubber-soled slippers or shoes whenever you walk in the hospital.  Avoid quick, sudden movements.  Change positions slowly.  Sit on the side of your bed before standing.  Stand up slowly and wait before you start to walk.  Let your caregiver know if there is a spill on the floor.  Pay careful attention to the medical equipment, electrical cords, and tubes around you.  When you need help, use your  call button by your bed or in the bathroom. Wait for one of your caregivers to help you.  If you feel dizzy or unsure of your footing, return to bed and wait for assistance.  Avoid being distracted by the TV, telephone, or another person in your room.  Do not lean or support yourself on rolling objects, such as IV poles or bedside tables. Document Released: 05/16/2000 Document Revised: 05/05/2012 Document Reviewed: 01/25/2012 [facility] Patient Information 2015 [place], [place]. This information is not intended to replace advice given to you by your health care provider. Make sure you discuss any questions you have with your health care provider.

## 2015-03-06 NOTE — ED Notes (Signed)
Patient transported to X-ray 

## 2015-03-07 DIAGNOSIS — G0430 Acute necrotizing hemorrhagic encephalopathy, unspecified: Secondary | ICD-10-CM | POA: Diagnosis not present

## 2015-03-08 DIAGNOSIS — R1312 Dysphagia, oropharyngeal phase: Secondary | ICD-10-CM | POA: Diagnosis not present

## 2015-03-08 DIAGNOSIS — G0430 Acute necrotizing hemorrhagic encephalopathy, unspecified: Secondary | ICD-10-CM | POA: Diagnosis not present

## 2015-03-08 DIAGNOSIS — R278 Other lack of coordination: Secondary | ICD-10-CM | POA: Diagnosis not present

## 2015-03-08 DIAGNOSIS — M6281 Muscle weakness (generalized): Secondary | ICD-10-CM | POA: Diagnosis not present

## 2015-03-09 DIAGNOSIS — M6281 Muscle weakness (generalized): Secondary | ICD-10-CM | POA: Diagnosis not present

## 2015-03-09 DIAGNOSIS — R1312 Dysphagia, oropharyngeal phase: Secondary | ICD-10-CM | POA: Diagnosis not present

## 2015-03-09 DIAGNOSIS — R278 Other lack of coordination: Secondary | ICD-10-CM | POA: Diagnosis not present

## 2015-03-09 DIAGNOSIS — G0430 Acute necrotizing hemorrhagic encephalopathy, unspecified: Secondary | ICD-10-CM | POA: Diagnosis not present

## 2015-03-10 DIAGNOSIS — G0430 Acute necrotizing hemorrhagic encephalopathy, unspecified: Secondary | ICD-10-CM | POA: Diagnosis not present

## 2015-03-11 DIAGNOSIS — G0430 Acute necrotizing hemorrhagic encephalopathy, unspecified: Secondary | ICD-10-CM | POA: Diagnosis not present

## 2015-03-12 DIAGNOSIS — R278 Other lack of coordination: Secondary | ICD-10-CM | POA: Diagnosis not present

## 2015-03-12 DIAGNOSIS — M6281 Muscle weakness (generalized): Secondary | ICD-10-CM | POA: Diagnosis not present

## 2015-03-12 DIAGNOSIS — G0430 Acute necrotizing hemorrhagic encephalopathy, unspecified: Secondary | ICD-10-CM | POA: Diagnosis not present

## 2015-03-12 DIAGNOSIS — R1312 Dysphagia, oropharyngeal phase: Secondary | ICD-10-CM | POA: Diagnosis not present

## 2015-03-13 DIAGNOSIS — R278 Other lack of coordination: Secondary | ICD-10-CM | POA: Diagnosis not present

## 2015-03-13 DIAGNOSIS — M6281 Muscle weakness (generalized): Secondary | ICD-10-CM | POA: Diagnosis not present

## 2015-03-13 DIAGNOSIS — F0281 Dementia in other diseases classified elsewhere with behavioral disturbance: Secondary | ICD-10-CM | POA: Diagnosis not present

## 2015-03-13 DIAGNOSIS — R1312 Dysphagia, oropharyngeal phase: Secondary | ICD-10-CM | POA: Diagnosis not present

## 2015-03-13 DIAGNOSIS — G0430 Acute necrotizing hemorrhagic encephalopathy, unspecified: Secondary | ICD-10-CM | POA: Diagnosis not present

## 2015-03-13 DIAGNOSIS — F411 Generalized anxiety disorder: Secondary | ICD-10-CM | POA: Diagnosis not present

## 2015-03-13 DIAGNOSIS — G309 Alzheimer's disease, unspecified: Secondary | ICD-10-CM | POA: Diagnosis not present

## 2015-03-14 DIAGNOSIS — G0430 Acute necrotizing hemorrhagic encephalopathy, unspecified: Secondary | ICD-10-CM | POA: Diagnosis not present

## 2015-03-14 DIAGNOSIS — R278 Other lack of coordination: Secondary | ICD-10-CM | POA: Diagnosis not present

## 2015-03-14 DIAGNOSIS — R1312 Dysphagia, oropharyngeal phase: Secondary | ICD-10-CM | POA: Diagnosis not present

## 2015-03-14 DIAGNOSIS — M6281 Muscle weakness (generalized): Secondary | ICD-10-CM | POA: Diagnosis not present

## 2015-03-15 DIAGNOSIS — R278 Other lack of coordination: Secondary | ICD-10-CM | POA: Diagnosis not present

## 2015-03-15 DIAGNOSIS — R1312 Dysphagia, oropharyngeal phase: Secondary | ICD-10-CM | POA: Diagnosis not present

## 2015-03-15 DIAGNOSIS — M6281 Muscle weakness (generalized): Secondary | ICD-10-CM | POA: Diagnosis not present

## 2015-03-15 DIAGNOSIS — G0430 Acute necrotizing hemorrhagic encephalopathy, unspecified: Secondary | ICD-10-CM | POA: Diagnosis not present

## 2015-03-16 DIAGNOSIS — Z961 Presence of intraocular lens: Secondary | ICD-10-CM | POA: Diagnosis not present

## 2015-03-16 DIAGNOSIS — R278 Other lack of coordination: Secondary | ICD-10-CM | POA: Diagnosis not present

## 2015-03-16 DIAGNOSIS — M6281 Muscle weakness (generalized): Secondary | ICD-10-CM | POA: Diagnosis not present

## 2015-03-16 DIAGNOSIS — R1312 Dysphagia, oropharyngeal phase: Secondary | ICD-10-CM | POA: Diagnosis not present

## 2015-03-16 DIAGNOSIS — G0430 Acute necrotizing hemorrhagic encephalopathy, unspecified: Secondary | ICD-10-CM | POA: Diagnosis not present

## 2015-03-17 DIAGNOSIS — G0430 Acute necrotizing hemorrhagic encephalopathy, unspecified: Secondary | ICD-10-CM | POA: Diagnosis not present

## 2015-03-18 DIAGNOSIS — G0430 Acute necrotizing hemorrhagic encephalopathy, unspecified: Secondary | ICD-10-CM | POA: Diagnosis not present

## 2015-03-18 DIAGNOSIS — R278 Other lack of coordination: Secondary | ICD-10-CM | POA: Diagnosis not present

## 2015-03-18 DIAGNOSIS — M6281 Muscle weakness (generalized): Secondary | ICD-10-CM | POA: Diagnosis not present

## 2015-03-18 DIAGNOSIS — R1312 Dysphagia, oropharyngeal phase: Secondary | ICD-10-CM | POA: Diagnosis not present

## 2015-03-19 DIAGNOSIS — R278 Other lack of coordination: Secondary | ICD-10-CM | POA: Diagnosis not present

## 2015-03-19 DIAGNOSIS — M6281 Muscle weakness (generalized): Secondary | ICD-10-CM | POA: Diagnosis not present

## 2015-03-19 DIAGNOSIS — G0430 Acute necrotizing hemorrhagic encephalopathy, unspecified: Secondary | ICD-10-CM | POA: Diagnosis not present

## 2015-03-19 DIAGNOSIS — R1312 Dysphagia, oropharyngeal phase: Secondary | ICD-10-CM | POA: Diagnosis not present

## 2015-03-20 DIAGNOSIS — R1312 Dysphagia, oropharyngeal phase: Secondary | ICD-10-CM | POA: Diagnosis not present

## 2015-03-20 DIAGNOSIS — G0430 Acute necrotizing hemorrhagic encephalopathy, unspecified: Secondary | ICD-10-CM | POA: Diagnosis not present

## 2015-03-20 DIAGNOSIS — R278 Other lack of coordination: Secondary | ICD-10-CM | POA: Diagnosis not present

## 2015-03-20 DIAGNOSIS — M6281 Muscle weakness (generalized): Secondary | ICD-10-CM | POA: Diagnosis not present

## 2015-03-21 DIAGNOSIS — R278 Other lack of coordination: Secondary | ICD-10-CM | POA: Diagnosis not present

## 2015-03-21 DIAGNOSIS — M6281 Muscle weakness (generalized): Secondary | ICD-10-CM | POA: Diagnosis not present

## 2015-03-21 DIAGNOSIS — R1312 Dysphagia, oropharyngeal phase: Secondary | ICD-10-CM | POA: Diagnosis not present

## 2015-03-21 DIAGNOSIS — G0430 Acute necrotizing hemorrhagic encephalopathy, unspecified: Secondary | ICD-10-CM | POA: Diagnosis not present

## 2015-03-22 ENCOUNTER — Emergency Department (HOSPITAL_COMMUNITY): Payer: Commercial Managed Care - HMO

## 2015-03-22 ENCOUNTER — Emergency Department (HOSPITAL_COMMUNITY)
Admission: EM | Admit: 2015-03-22 | Discharge: 2015-03-23 | Disposition: A | Discharge status: Home / Self Care | Payer: Commercial Managed Care - HMO | Attending: Emergency Medicine | Admitting: Emergency Medicine

## 2015-03-22 ENCOUNTER — Encounter (HOSPITAL_COMMUNITY): Payer: Self-pay | Admitting: Emergency Medicine

## 2015-03-22 DIAGNOSIS — Z8619 Personal history of other infectious and parasitic diseases: Secondary | ICD-10-CM | POA: Insufficient documentation

## 2015-03-22 DIAGNOSIS — T148 Other injury of unspecified body region: Secondary | ICD-10-CM | POA: Diagnosis not present

## 2015-03-22 DIAGNOSIS — I129 Hypertensive chronic kidney disease with stage 1 through stage 4 chronic kidney disease, or unspecified chronic kidney disease: Secondary | ICD-10-CM | POA: Insufficient documentation

## 2015-03-22 DIAGNOSIS — F419 Anxiety disorder, unspecified: Secondary | ICD-10-CM | POA: Diagnosis not present

## 2015-03-22 DIAGNOSIS — N189 Chronic kidney disease, unspecified: Secondary | ICD-10-CM | POA: Diagnosis not present

## 2015-03-22 DIAGNOSIS — Z79899 Other long term (current) drug therapy: Secondary | ICD-10-CM | POA: Insufficient documentation

## 2015-03-22 DIAGNOSIS — M1990 Unspecified osteoarthritis, unspecified site: Secondary | ICD-10-CM | POA: Diagnosis not present

## 2015-03-22 DIAGNOSIS — Y998 Other external cause status: Secondary | ICD-10-CM | POA: Insufficient documentation

## 2015-03-22 DIAGNOSIS — Y9389 Activity, other specified: Secondary | ICD-10-CM | POA: Insufficient documentation

## 2015-03-22 DIAGNOSIS — Z8701 Personal history of pneumonia (recurrent): Secondary | ICD-10-CM | POA: Insufficient documentation

## 2015-03-22 DIAGNOSIS — E039 Hypothyroidism, unspecified: Secondary | ICD-10-CM | POA: Insufficient documentation

## 2015-03-22 DIAGNOSIS — R1312 Dysphagia, oropharyngeal phase: Secondary | ICD-10-CM | POA: Diagnosis not present

## 2015-03-22 DIAGNOSIS — R278 Other lack of coordination: Secondary | ICD-10-CM | POA: Diagnosis not present

## 2015-03-22 DIAGNOSIS — J45909 Unspecified asthma, uncomplicated: Secondary | ICD-10-CM | POA: Diagnosis not present

## 2015-03-22 DIAGNOSIS — Z862 Personal history of diseases of the blood and blood-forming organs and certain disorders involving the immune mechanism: Secondary | ICD-10-CM | POA: Diagnosis not present

## 2015-03-22 DIAGNOSIS — Z86010 Personal history of colonic polyps: Secondary | ICD-10-CM | POA: Diagnosis not present

## 2015-03-22 DIAGNOSIS — Z9889 Other specified postprocedural states: Secondary | ICD-10-CM | POA: Diagnosis not present

## 2015-03-22 DIAGNOSIS — Z87440 Personal history of urinary (tract) infections: Secondary | ICD-10-CM | POA: Insufficient documentation

## 2015-03-22 DIAGNOSIS — M25552 Pain in left hip: Secondary | ICD-10-CM | POA: Diagnosis not present

## 2015-03-22 DIAGNOSIS — W01198A Fall on same level from slipping, tripping and stumbling with subsequent striking against other object, initial encounter: Secondary | ICD-10-CM | POA: Diagnosis not present

## 2015-03-22 DIAGNOSIS — R011 Cardiac murmur, unspecified: Secondary | ICD-10-CM | POA: Diagnosis not present

## 2015-03-22 DIAGNOSIS — F0390 Unspecified dementia without behavioral disturbance: Secondary | ICD-10-CM | POA: Diagnosis not present

## 2015-03-22 DIAGNOSIS — S199XXA Unspecified injury of neck, initial encounter: Secondary | ICD-10-CM | POA: Diagnosis not present

## 2015-03-22 DIAGNOSIS — Z87891 Personal history of nicotine dependence: Secondary | ICD-10-CM | POA: Insufficient documentation

## 2015-03-22 DIAGNOSIS — Y9289 Other specified places as the place of occurrence of the external cause: Secondary | ICD-10-CM | POA: Insufficient documentation

## 2015-03-22 DIAGNOSIS — M6281 Muscle weakness (generalized): Secondary | ICD-10-CM | POA: Diagnosis not present

## 2015-03-22 DIAGNOSIS — Z7982 Long term (current) use of aspirin: Secondary | ICD-10-CM | POA: Insufficient documentation

## 2015-03-22 DIAGNOSIS — W19XXXA Unspecified fall, initial encounter: Secondary | ICD-10-CM

## 2015-03-22 DIAGNOSIS — F329 Major depressive disorder, single episode, unspecified: Secondary | ICD-10-CM | POA: Insufficient documentation

## 2015-03-22 DIAGNOSIS — M542 Cervicalgia: Secondary | ICD-10-CM | POA: Diagnosis not present

## 2015-03-22 DIAGNOSIS — M549 Dorsalgia, unspecified: Secondary | ICD-10-CM | POA: Diagnosis not present

## 2015-03-22 LAB — CBC WITH DIFFERENTIAL/PLATELET
BASOS ABS: 0 10*3/uL (ref 0.0–0.1)
BASOS PCT: 0 %
EOS PCT: 1 %
Eosinophils Absolute: 0.1 10*3/uL (ref 0.0–0.7)
HEMATOCRIT: 34 % — AB (ref 36.0–46.0)
Hemoglobin: 11.3 g/dL — ABNORMAL LOW (ref 12.0–15.0)
Lymphocytes Relative: 20 %
Lymphs Abs: 1.8 10*3/uL (ref 0.7–4.0)
MCH: 29.4 pg (ref 26.0–34.0)
MCHC: 33.2 g/dL (ref 30.0–36.0)
MCV: 88.3 fL (ref 78.0–100.0)
MONO ABS: 0.5 10*3/uL (ref 0.1–1.0)
MONOS PCT: 6 %
NEUTROS ABS: 6.4 10*3/uL (ref 1.7–7.7)
Neutrophils Relative %: 73 %
PLATELETS: 220 10*3/uL (ref 150–400)
RBC: 3.85 MIL/uL — ABNORMAL LOW (ref 3.87–5.11)
RDW: 12.9 % (ref 11.5–15.5)
WBC: 8.8 10*3/uL (ref 4.0–10.5)

## 2015-03-22 NOTE — ED Provider Notes (Signed)
CSN: [id number]   Arrival date & time 03/22/15 2213  History  By signing my name below, I, [name], attest that this documentation has been prepared under the direction and in the presence of Deborah Racer, MD. Electronically Signed: Bethel Horton, ED Scribe. 03/23/2015. 3:24 AM.  Chief Complaint  Patient presents with  . Fall   Level V caveat secondary to dementia HPI The history is provided by the nursing home and the patient. No language interpreter was used.   Brought in by EMS from [facility], MARKEETA Horton is a 81 y.o. female with history of dementia who presents to the Emergency Department after an unwitnessed fall. All that she remembers of the fall is that she thought she sat down. Per EMS, nursing home staff stated that the pt hit her head. Associated symptoms include neck pain. No cervical collar on arrival.   Past Medical History  Diagnosis Date  . Arthritis   . Chicken pox   . Chronic kidney disease   . Colon polyps   . Hypertension   . Dementia   . Angina   . Dysrhythmia   . Heart murmur   . Shortness of breath   . Recurrent upper respiratory infection (URI)   . Pneumonia   . Hypothyroidism   . Anemia   . Headache(784.0)   . Anxiety   . Depression   . Valvular heart disease 09/04/2012  . Left leg pain 10/02/2012  . UTI (urinary tract infection) 12/30/2012  . Acute bronchitis 04/10/2013  . Dehydration 08/31/2013  . Arthritis 10/02/2012  . Asthma     Past Surgical History  Procedure Laterality Date  . Appendectomy    . Cholecystectomy    . Kidney stones    . Tonsillectomy    . Abdominal hysterectomy    . Hand surgery    . Feet surgery    . Shoulder arthroscopy    . Back surgery    . Tonsillectomy    . Eye surgery      cataract removed and eye lids lifted  . Fracture surgery      bilateral arms  . Tubal ligation    . Colonoscopy w/ polypectomy      Family History  Problem Relation Age of Onset  . Heart disease Mother   . Heart disease  Father   . Heart disease Other   . Birth defects Other     Social History  Substance Use Topics  . Smoking status: Former Smoker -- 0.20 packs/day for 1 years    Types: Cigarettes    Quit date: 06/02/1941  . Smokeless tobacco: Never Used  . Alcohol Use: No     Review of Systems  Unable to perform ROS: Dementia   Home Medications   Prior to Admission medications   Medication Sig Start Date End Date Taking? Authorizing Provider  aspirin 81 MG chewable tablet Chew 81 mg by mouth daily.   Yes Historical Provider, MD  clonazePAM (KLONOPIN) 0.5 MG tablet Take 0.5 tablets (0.25 mg total) by mouth 2 (two) times daily. 01/16/15  Yes Deborah Ide, MD  clonazePAM (KLONOPIN) 0.5 MG tablet Take 0.5 mg by mouth 2 (two) times daily.    Yes Historical Provider, MD  diazepam (VALIUM) 5 MG tablet Take 5 mg by mouth at bedtime.   Yes Historical Provider, MD  divalproex (DEPAKOTE) 500 MG DR tablet Take 500 mg by mouth 3 (three) times daily.   Yes Historical Provider, MD  escitalopram (LEXAPRO)  10 MG tablet Take 10 mg by mouth daily.   Yes Historical Provider, MD  lactulose (CHRONULAC) 10 GM/15ML solution Take 20 g by mouth 2 (two) times daily.   Yes Historical Provider, MD  levothyroxine (SYNTHROID, LEVOTHROID) 50 MCG tablet TAKE 1 TABLET (50 MCG TOTAL) BY MOUTH DAILY. 09/25/14  Yes Deborah Broker, MD  losartan (COZAAR) 50 MG tablet Take 50 mg by mouth daily.   Yes Historical Provider, MD  risperiDONE (RISPERDAL) 2 MG tablet Take 2 mg by mouth at bedtime.   Yes Historical Provider, MD  traZODone (DESYREL) 50 MG tablet Take 50 mg by mouth at bedtime as needed for sleep.   Yes Historical Provider, MD  acetaminophen (TYLENOL) 500 MG tablet Take 500 mg by mouth every 4 (four) hours as needed for mild pain, fever or headache.    Historical Provider, MD  albuterol (PROVENTIL HFA;VENTOLIN HFA) 108 (90 BASE) MCG/ACT inhaler Inhale 2 puffs into the lungs every 6 (six) hours as needed for wheezing or shortness  of breath. 09/23/13   Deborah Craze, NP  amoxicillin (AMOXIL) 500 MG capsule Take 1 capsule (500 mg total) by mouth every 8 (eight) hours. Patient not taking: Reported on 03/06/2015 02/15/15   Deborah Hartshorn, MD  guaiFENesin (Q-TUSSIN) 100 MG/5ML liquid Take 200 mg by mouth every 6 (six) hours as needed for cough.    Historical Provider, MD  loperamide (IMODIUM) 2 MG capsule Take 2 mg by mouth as needed for diarrhea or loose stools.    Historical Provider, MD  magnesium hydroxide (MILK OF MAGNESIA) 400 MG/5ML suspension Take 30 mLs by mouth at bedtime as needed for mild constipation.    Historical Provider, MD  neomycin-bacitracin-polymyxin (NEOSPORIN) ointment Apply 1 application topically as needed for wound care.    Historical Provider, MD    Allergies  Morphine and related; Codeine; and Hydrocodone  Triage Vitals: BP 125/45 mmHg  Pulse 66  Temp(Src) 98.1 F (36.7 C)  Resp 16  SpO2 96%  Physical Exam  Constitutional: She appears well-developed and well-nourished. No distress.  Patient appears sedated  HENT:  Head: Normocephalic and atraumatic.  Mouth/Throat: Oropharynx is clear and moist. No oropharyngeal exudate.  Eyes: EOM are normal.  Pinpoint pupils bilaterally  Neck: Normal range of motion. Neck supple.  Mild tenderness to palpation over the spinous process of C7. No obvious injury or deformity. No step-offs.  Cardiovascular: Normal rate and regular rhythm.   Pulmonary/Chest: Effort normal and breath sounds normal. No respiratory distress. She has no wheezes. She has no rales. She exhibits no tenderness.  Abdominal: Soft. Bowel sounds are normal. She exhibits no distension and no mass. There is no tenderness. There is no rebound and no guarding.  Musculoskeletal: Normal range of motion. She exhibits no edema or tenderness.  Patient grimaces with range of motion of the left hip.  Neurological:  Oriented to person. Will follow simple commands. Moving all extremities. Lethargic   Skin: Skin is warm and dry. No rash noted. No erythema.  Nursing note and vitals reviewed.   ED Course  Procedures   DIAGNOSTIC STUDIES: Oxygen Saturation is 96% on RA, normal by my interpretation.    Labs Reviewed  CBC WITH DIFFERENTIAL/PLATELET - Abnormal; Notable for the following:    RBC 3.85 (*)    Hemoglobin 11.3 (*)    HCT 34.0 (*)    All other components within normal limits  COMPREHENSIVE METABOLIC PANEL - Abnormal; Notable for the following:    Glucose, Bld 118 (*)  BUN 24 (*)    Total Protein 6.3 (*)    Albumin 3.4 (*)    All other components within normal limits  URINALYSIS, ROUTINE W REFLEX MICROSCOPIC (NOT AT [facility]) - Abnormal; Notable for the following:    Leukocytes, UA TRACE (*)    All other components within normal limits  PROTIME-INR  APTT  URINE MICROSCOPIC-ADD ON    Imaging Review Ct Head Wo Contrast  03/23/2015  CLINICAL DATA:  Status post unwitnessed fall. Patient hit head. Concern for head or cervical spine injury. Initial encounter. EXAM: CT HEAD WITHOUT CONTRAST CT CERVICAL SPINE WITHOUT CONTRAST TECHNIQUE: Multidetector CT imaging of the head and cervical spine was performed following the standard protocol without intravenous contrast. Multiplanar CT image reconstructions of the cervical spine were also generated. COMPARISON:  CT of the head and cervical spine performed 02/18/2015 FINDINGS: CT HEAD FINDINGS There is no evidence of acute infarction, mass lesion, or intra- or extra-axial hemorrhage on CT. Prominence of ventricles and sulci reflects moderate cortical volume loss. Cerebellar atrophy is noted. Scattered periventricular white matter change likely reflects small vessel ischemic microangiopathy. The brainstem and fourth ventricle are within normal limits. The basal ganglia are unremarkable in appearance. The cerebral hemispheres demonstrate grossly normal gray-white differentiation. No mass effect or midline shift is seen. There is no evidence  of fracture; visualized osseous structures are unremarkable in appearance. The orbits are within normal limits. The paranasal sinuses and mastoid air cells are well-aerated. No significant soft tissue abnormalities are seen. CT CERVICAL SPINE FINDINGS There is no evidence of fracture or subluxation. Vertebral bodies demonstrate normal height and alignment. Intervertebral disc spaces are preserved. Scattered small anterior and posterior disc osteophyte complexes are noted along the cervical spine. Mild underlying facet disease is noted. Prevertebral soft tissues are within normal limits. The thyroid gland is unremarkable in appearance. Mild scarring is noted at the lung apices. Mild calcification is noted at the carotid bifurcations bilaterally. IMPRESSION: 1. No evidence of traumatic intracranial injury or fracture. 2. No evidence of fracture or subluxation along the cervical spine. 3. Moderate cortical volume loss and scattered small vessel ischemic microangiopathy. 4. Minimal degenerative change along the cervical spine. 5. Mild scarring at the lung apices. 6. Mild calcification at the carotid bifurcations bilaterally. Electronically Signed   By: Roanna Raider M.D.   On: 03/23/2015 00:04   Ct Cervical Spine Wo Contrast  03/23/2015  CLINICAL DATA:  Status post unwitnessed fall. Patient hit head. Concern for head or cervical spine injury. Initial encounter. EXAM: CT HEAD WITHOUT CONTRAST CT CERVICAL SPINE WITHOUT CONTRAST TECHNIQUE: Multidetector CT imaging of the head and cervical spine was performed following the standard protocol without intravenous contrast. Multiplanar CT image reconstructions of the cervical spine were also generated. COMPARISON:  CT of the head and cervical spine performed 02/18/2015 FINDINGS: CT HEAD FINDINGS There is no evidence of acute infarction, mass lesion, or intra- or extra-axial hemorrhage on CT. Prominence of ventricles and sulci reflects moderate cortical volume loss.  Cerebellar atrophy is noted. Scattered periventricular white matter change likely reflects small vessel ischemic microangiopathy. The brainstem and fourth ventricle are within normal limits. The basal ganglia are unremarkable in appearance. The cerebral hemispheres demonstrate grossly normal gray-white differentiation. No mass effect or midline shift is seen. There is no evidence of fracture; visualized osseous structures are unremarkable in appearance. The orbits are within normal limits. The paranasal sinuses and mastoid air cells are well-aerated. No significant soft tissue abnormalities are seen. CT CERVICAL SPINE FINDINGS  There is no evidence of fracture or subluxation. Vertebral bodies demonstrate normal height and alignment. Intervertebral disc spaces are preserved. Scattered small anterior and posterior disc osteophyte complexes are noted along the cervical spine. Mild underlying facet disease is noted. Prevertebral soft tissues are within normal limits. The thyroid gland is unremarkable in appearance. Mild scarring is noted at the lung apices. Mild calcification is noted at the carotid bifurcations bilaterally. IMPRESSION: 1. No evidence of traumatic intracranial injury or fracture. 2. No evidence of fracture or subluxation along the cervical spine. 3. Moderate cortical volume loss and scattered small vessel ischemic microangiopathy. 4. Minimal degenerative change along the cervical spine. 5. Mild scarring at the lung apices. 6. Mild calcification at the carotid bifurcations bilaterally. Electronically Signed   By: Roanna RaiderJeffery  Chang M.D.   On: 03/23/2015 00:04   Dg Hip Unilat With Pelvis 2-3 Views Left  03/23/2015  CLINICAL DATA:  Status post unwitnessed fall. Fell onto buttocks, with posterior left hip pain. Initial encounter. EXAM: DG HIP (WITH OR WITHOUT PELVIS) 2-3V LEFT COMPARISON:  Left hip radiographs performed 02/12/2015 FINDINGS: There is no evidence of fracture or dislocation. Both femoral heads  are seated normally within their respective acetabula. The proximal left femur appears intact. Mild degenerative change is noted at the lower lumbar spine. The sacroiliac joints are unremarkable in appearance. The visualized bowel gas pattern is grossly unremarkable in appearance. IMPRESSION: No evidence of fracture or dislocation. Electronically Signed   By: Roanna RaiderJeffery  Chang M.D.   On: 03/23/2015 00:21    I personally reviewed and evaluated these images and lab results as a part of my medical decision-making.   EKG Interpretation  Date/Time:    Ventricular Rate:    PR Interval:    QRS Duration:   QT Interval:    QTC Calculation:   R Axis:     Text Interpretation:      MDM   Final diagnoses:  Fall, initial encounter    I, Ranae PalmsYELVERTON, Kathan Kirker, personally performed the services described in this documentation. All medical record entries made by the scribe were at my direction and in my presence.  I have reviewed the chart and discharge instructions and agree that the record reflects my personal performance and is accurate and complete. Paolo Okane.  03/23/2015. 3:24 AM.    Work up is essentially negative. Patient is returned to her normal mental status. [facility]  Deborah Raceravid Maxie Debose, MD 03/23/15 [phone]

## 2015-03-22 NOTE — ED Notes (Signed)
C-collar in place

## 2015-03-22 NOTE — ED Notes (Signed)
[facility] Expected date:  Expected time:  Means of arrival:  Comments: EMS 81yo Fall, hx of dementia; ? Struck head, no blood thinners

## 2015-03-22 NOTE — ED Notes (Signed)
[facility] for unwitnessed fall. Staff reports pt hit head, no obvious deformities. No anticoagulants, Hx of dementia.   Last VS: 121/53, 94%RA, 66hr.

## 2015-03-23 DIAGNOSIS — F419 Anxiety disorder, unspecified: Secondary | ICD-10-CM | POA: Diagnosis not present

## 2015-03-23 DIAGNOSIS — M6281 Muscle weakness (generalized): Secondary | ICD-10-CM | POA: Diagnosis not present

## 2015-03-23 DIAGNOSIS — M25552 Pain in left hip: Secondary | ICD-10-CM | POA: Diagnosis not present

## 2015-03-23 DIAGNOSIS — M1990 Unspecified osteoarthritis, unspecified site: Secondary | ICD-10-CM | POA: Diagnosis not present

## 2015-03-23 DIAGNOSIS — R011 Cardiac murmur, unspecified: Secondary | ICD-10-CM | POA: Diagnosis not present

## 2015-03-23 DIAGNOSIS — Z9889 Other specified postprocedural states: Secondary | ICD-10-CM | POA: Diagnosis not present

## 2015-03-23 DIAGNOSIS — G0430 Acute necrotizing hemorrhagic encephalopathy, unspecified: Secondary | ICD-10-CM | POA: Diagnosis not present

## 2015-03-23 DIAGNOSIS — R1312 Dysphagia, oropharyngeal phase: Secondary | ICD-10-CM | POA: Diagnosis not present

## 2015-03-23 DIAGNOSIS — N189 Chronic kidney disease, unspecified: Secondary | ICD-10-CM | POA: Diagnosis not present

## 2015-03-23 DIAGNOSIS — F0390 Unspecified dementia without behavioral disturbance: Secondary | ICD-10-CM | POA: Diagnosis not present

## 2015-03-23 DIAGNOSIS — S199XXA Unspecified injury of neck, initial encounter: Secondary | ICD-10-CM | POA: Diagnosis not present

## 2015-03-23 DIAGNOSIS — F329 Major depressive disorder, single episode, unspecified: Secondary | ICD-10-CM | POA: Diagnosis not present

## 2015-03-23 DIAGNOSIS — R278 Other lack of coordination: Secondary | ICD-10-CM | POA: Diagnosis not present

## 2015-03-23 DIAGNOSIS — J45909 Unspecified asthma, uncomplicated: Secondary | ICD-10-CM | POA: Diagnosis not present

## 2015-03-23 DIAGNOSIS — I129 Hypertensive chronic kidney disease with stage 1 through stage 4 chronic kidney disease, or unspecified chronic kidney disease: Secondary | ICD-10-CM | POA: Diagnosis not present

## 2015-03-23 LAB — URINALYSIS, ROUTINE W REFLEX MICROSCOPIC
BILIRUBIN URINE: NEGATIVE
Glucose, UA: NEGATIVE mg/dL
HGB URINE DIPSTICK: NEGATIVE
KETONES UR: NEGATIVE mg/dL
NITRITE: NEGATIVE
Protein, ur: NEGATIVE mg/dL
Specific Gravity, Urine: 1.015 (ref 1.005–1.030)
UROBILINOGEN UA: 0.2 mg/dL (ref 0.0–1.0)
pH: 5 (ref 5.0–8.0)

## 2015-03-23 LAB — APTT: APTT: 31 s (ref 24–37)

## 2015-03-23 LAB — COMPREHENSIVE METABOLIC PANEL
ALK PHOS: 71 U/L (ref 38–126)
ALT: 18 U/L (ref 14–54)
ANION GAP: 5 (ref 5–15)
AST: 19 U/L (ref 15–41)
Albumin: 3.4 g/dL — ABNORMAL LOW (ref 3.5–5.0)
BILIRUBIN TOTAL: 0.4 mg/dL (ref 0.3–1.2)
BUN: 24 mg/dL — ABNORMAL HIGH (ref 6–20)
CALCIUM: 9.3 mg/dL (ref 8.9–10.3)
CO2: 29 mmol/L (ref 22–32)
CREATININE: 0.8 mg/dL (ref 0.44–1.00)
Chloride: 106 mmol/L (ref 101–111)
Glucose, Bld: 118 mg/dL — ABNORMAL HIGH (ref 65–99)
Potassium: 4.2 mmol/L (ref 3.5–5.1)
SODIUM: 140 mmol/L (ref 135–145)
TOTAL PROTEIN: 6.3 g/dL — AB (ref 6.5–8.1)

## 2015-03-23 LAB — URINE MICROSCOPIC-ADD ON

## 2015-03-23 LAB — PROTIME-INR
INR: 0.93 (ref 0.00–1.49)
Prothrombin Time: 12.7 seconds (ref 11.6–15.2)

## 2015-03-23 NOTE — ED Notes (Signed)
Patient waiting on [facility] to arrive. Patient is discharge.

## 2015-03-24 DIAGNOSIS — G0430 Acute necrotizing hemorrhagic encephalopathy, unspecified: Secondary | ICD-10-CM | POA: Diagnosis not present

## 2015-03-25 DIAGNOSIS — G0430 Acute necrotizing hemorrhagic encephalopathy, unspecified: Secondary | ICD-10-CM | POA: Diagnosis not present

## 2015-03-26 DIAGNOSIS — G0430 Acute necrotizing hemorrhagic encephalopathy, unspecified: Secondary | ICD-10-CM | POA: Diagnosis not present

## 2015-03-26 DIAGNOSIS — R278 Other lack of coordination: Secondary | ICD-10-CM | POA: Diagnosis not present

## 2015-03-26 DIAGNOSIS — R1312 Dysphagia, oropharyngeal phase: Secondary | ICD-10-CM | POA: Diagnosis not present

## 2015-03-26 DIAGNOSIS — M6281 Muscle weakness (generalized): Secondary | ICD-10-CM | POA: Diagnosis not present

## 2015-03-27 DIAGNOSIS — M6281 Muscle weakness (generalized): Secondary | ICD-10-CM | POA: Diagnosis not present

## 2015-03-27 DIAGNOSIS — G0430 Acute necrotizing hemorrhagic encephalopathy, unspecified: Secondary | ICD-10-CM | POA: Diagnosis not present

## 2015-03-27 DIAGNOSIS — G309 Alzheimer's disease, unspecified: Secondary | ICD-10-CM | POA: Diagnosis not present

## 2015-03-27 DIAGNOSIS — F0281 Dementia in other diseases classified elsewhere with behavioral disturbance: Secondary | ICD-10-CM | POA: Diagnosis not present

## 2015-03-27 DIAGNOSIS — R1312 Dysphagia, oropharyngeal phase: Secondary | ICD-10-CM | POA: Diagnosis not present

## 2015-03-27 DIAGNOSIS — R296 Repeated falls: Secondary | ICD-10-CM | POA: Diagnosis not present

## 2015-03-27 DIAGNOSIS — F5101 Primary insomnia: Secondary | ICD-10-CM | POA: Diagnosis not present

## 2015-03-27 DIAGNOSIS — R278 Other lack of coordination: Secondary | ICD-10-CM | POA: Diagnosis not present

## 2015-03-28 DIAGNOSIS — M6281 Muscle weakness (generalized): Secondary | ICD-10-CM | POA: Diagnosis not present

## 2015-03-28 DIAGNOSIS — R278 Other lack of coordination: Secondary | ICD-10-CM | POA: Diagnosis not present

## 2015-03-28 DIAGNOSIS — R1312 Dysphagia, oropharyngeal phase: Secondary | ICD-10-CM | POA: Diagnosis not present

## 2015-03-28 DIAGNOSIS — G0430 Acute necrotizing hemorrhagic encephalopathy, unspecified: Secondary | ICD-10-CM | POA: Diagnosis not present

## 2015-03-29 DIAGNOSIS — M6281 Muscle weakness (generalized): Secondary | ICD-10-CM | POA: Diagnosis not present

## 2015-03-29 DIAGNOSIS — R1312 Dysphagia, oropharyngeal phase: Secondary | ICD-10-CM | POA: Diagnosis not present

## 2015-03-29 DIAGNOSIS — G0430 Acute necrotizing hemorrhagic encephalopathy, unspecified: Secondary | ICD-10-CM | POA: Diagnosis not present

## 2015-03-29 DIAGNOSIS — R278 Other lack of coordination: Secondary | ICD-10-CM | POA: Diagnosis not present

## 2015-03-30 DIAGNOSIS — R1312 Dysphagia, oropharyngeal phase: Secondary | ICD-10-CM | POA: Diagnosis not present

## 2015-03-30 DIAGNOSIS — R278 Other lack of coordination: Secondary | ICD-10-CM | POA: Diagnosis not present

## 2015-03-30 DIAGNOSIS — G0430 Acute necrotizing hemorrhagic encephalopathy, unspecified: Secondary | ICD-10-CM | POA: Diagnosis not present

## 2015-03-30 DIAGNOSIS — M6281 Muscle weakness (generalized): Secondary | ICD-10-CM | POA: Diagnosis not present

## 2015-03-31 DIAGNOSIS — G0430 Acute necrotizing hemorrhagic encephalopathy, unspecified: Secondary | ICD-10-CM | POA: Diagnosis not present

## 2015-04-01 DIAGNOSIS — G0430 Acute necrotizing hemorrhagic encephalopathy, unspecified: Secondary | ICD-10-CM | POA: Diagnosis not present

## 2015-04-02 DIAGNOSIS — M6281 Muscle weakness (generalized): Secondary | ICD-10-CM | POA: Diagnosis not present

## 2015-04-02 DIAGNOSIS — R278 Other lack of coordination: Secondary | ICD-10-CM | POA: Diagnosis not present

## 2015-04-02 DIAGNOSIS — G0430 Acute necrotizing hemorrhagic encephalopathy, unspecified: Secondary | ICD-10-CM | POA: Diagnosis not present

## 2015-04-02 DIAGNOSIS — R1312 Dysphagia, oropharyngeal phase: Secondary | ICD-10-CM | POA: Diagnosis not present

## 2015-04-03 DIAGNOSIS — G0430 Acute necrotizing hemorrhagic encephalopathy, unspecified: Secondary | ICD-10-CM | POA: Diagnosis not present

## 2015-04-03 DIAGNOSIS — M6281 Muscle weakness (generalized): Secondary | ICD-10-CM | POA: Diagnosis not present

## 2015-04-03 DIAGNOSIS — R278 Other lack of coordination: Secondary | ICD-10-CM | POA: Diagnosis not present

## 2015-04-04 DIAGNOSIS — G0430 Acute necrotizing hemorrhagic encephalopathy, unspecified: Secondary | ICD-10-CM | POA: Diagnosis not present

## 2015-04-04 DIAGNOSIS — R278 Other lack of coordination: Secondary | ICD-10-CM | POA: Diagnosis not present

## 2015-04-04 DIAGNOSIS — M6281 Muscle weakness (generalized): Secondary | ICD-10-CM | POA: Diagnosis not present

## 2015-04-05 DIAGNOSIS — G0430 Acute necrotizing hemorrhagic encephalopathy, unspecified: Secondary | ICD-10-CM | POA: Diagnosis not present

## 2015-04-05 DIAGNOSIS — M6281 Muscle weakness (generalized): Secondary | ICD-10-CM | POA: Diagnosis not present

## 2015-04-05 DIAGNOSIS — R278 Other lack of coordination: Secondary | ICD-10-CM | POA: Diagnosis not present

## 2015-04-06 DIAGNOSIS — R278 Other lack of coordination: Secondary | ICD-10-CM | POA: Diagnosis not present

## 2015-04-06 DIAGNOSIS — M6281 Muscle weakness (generalized): Secondary | ICD-10-CM | POA: Diagnosis not present

## 2015-04-06 DIAGNOSIS — G0430 Acute necrotizing hemorrhagic encephalopathy, unspecified: Secondary | ICD-10-CM | POA: Diagnosis not present

## 2015-04-07 DIAGNOSIS — G0430 Acute necrotizing hemorrhagic encephalopathy, unspecified: Secondary | ICD-10-CM | POA: Diagnosis not present

## 2015-04-08 DIAGNOSIS — G0430 Acute necrotizing hemorrhagic encephalopathy, unspecified: Secondary | ICD-10-CM | POA: Diagnosis not present

## 2015-04-09 DIAGNOSIS — G0430 Acute necrotizing hemorrhagic encephalopathy, unspecified: Secondary | ICD-10-CM | POA: Diagnosis not present

## 2015-04-09 DIAGNOSIS — M6281 Muscle weakness (generalized): Secondary | ICD-10-CM | POA: Diagnosis not present

## 2015-04-09 DIAGNOSIS — R278 Other lack of coordination: Secondary | ICD-10-CM | POA: Diagnosis not present

## 2015-04-10 DIAGNOSIS — M6281 Muscle weakness (generalized): Secondary | ICD-10-CM | POA: Diagnosis not present

## 2015-04-10 DIAGNOSIS — R278 Other lack of coordination: Secondary | ICD-10-CM | POA: Diagnosis not present

## 2015-04-10 DIAGNOSIS — G0430 Acute necrotizing hemorrhagic encephalopathy, unspecified: Secondary | ICD-10-CM | POA: Diagnosis not present

## 2015-04-11 DIAGNOSIS — G0430 Acute necrotizing hemorrhagic encephalopathy, unspecified: Secondary | ICD-10-CM | POA: Diagnosis not present

## 2015-04-11 DIAGNOSIS — M6281 Muscle weakness (generalized): Secondary | ICD-10-CM | POA: Diagnosis not present

## 2015-04-11 DIAGNOSIS — R278 Other lack of coordination: Secondary | ICD-10-CM | POA: Diagnosis not present

## 2015-04-12 DIAGNOSIS — R278 Other lack of coordination: Secondary | ICD-10-CM | POA: Diagnosis not present

## 2015-04-12 DIAGNOSIS — M6281 Muscle weakness (generalized): Secondary | ICD-10-CM | POA: Diagnosis not present

## 2015-04-12 DIAGNOSIS — G0430 Acute necrotizing hemorrhagic encephalopathy, unspecified: Secondary | ICD-10-CM | POA: Diagnosis not present

## 2015-04-13 DIAGNOSIS — R278 Other lack of coordination: Secondary | ICD-10-CM | POA: Diagnosis not present

## 2015-04-13 DIAGNOSIS — M6281 Muscle weakness (generalized): Secondary | ICD-10-CM | POA: Diagnosis not present

## 2015-04-13 DIAGNOSIS — G0430 Acute necrotizing hemorrhagic encephalopathy, unspecified: Secondary | ICD-10-CM | POA: Diagnosis not present

## 2015-04-14 DIAGNOSIS — G0430 Acute necrotizing hemorrhagic encephalopathy, unspecified: Secondary | ICD-10-CM | POA: Diagnosis not present

## 2015-04-15 DIAGNOSIS — G0430 Acute necrotizing hemorrhagic encephalopathy, unspecified: Secondary | ICD-10-CM | POA: Diagnosis not present

## 2015-04-16 DIAGNOSIS — G0430 Acute necrotizing hemorrhagic encephalopathy, unspecified: Secondary | ICD-10-CM | POA: Diagnosis not present

## 2015-04-17 DIAGNOSIS — G309 Alzheimer's disease, unspecified: Secondary | ICD-10-CM | POA: Diagnosis not present

## 2015-04-17 DIAGNOSIS — G0430 Acute necrotizing hemorrhagic encephalopathy, unspecified: Secondary | ICD-10-CM | POA: Diagnosis not present

## 2015-04-17 DIAGNOSIS — L03116 Cellulitis of left lower limb: Secondary | ICD-10-CM | POA: Diagnosis not present

## 2015-04-17 DIAGNOSIS — F0281 Dementia in other diseases classified elsewhere with behavioral disturbance: Secondary | ICD-10-CM | POA: Diagnosis not present

## 2015-04-17 DIAGNOSIS — R278 Other lack of coordination: Secondary | ICD-10-CM | POA: Diagnosis not present

## 2015-04-17 DIAGNOSIS — M6281 Muscle weakness (generalized): Secondary | ICD-10-CM | POA: Diagnosis not present

## 2015-04-18 DIAGNOSIS — G0430 Acute necrotizing hemorrhagic encephalopathy, unspecified: Secondary | ICD-10-CM | POA: Diagnosis not present

## 2015-04-19 DIAGNOSIS — G0430 Acute necrotizing hemorrhagic encephalopathy, unspecified: Secondary | ICD-10-CM | POA: Diagnosis not present

## 2015-04-20 DIAGNOSIS — G0430 Acute necrotizing hemorrhagic encephalopathy, unspecified: Secondary | ICD-10-CM | POA: Diagnosis not present

## 2015-04-21 DIAGNOSIS — G0430 Acute necrotizing hemorrhagic encephalopathy, unspecified: Secondary | ICD-10-CM | POA: Diagnosis not present

## 2015-04-22 DIAGNOSIS — G0430 Acute necrotizing hemorrhagic encephalopathy, unspecified: Secondary | ICD-10-CM | POA: Diagnosis not present

## 2015-04-23 DIAGNOSIS — G0430 Acute necrotizing hemorrhagic encephalopathy, unspecified: Secondary | ICD-10-CM | POA: Diagnosis not present

## 2015-04-24 DIAGNOSIS — L03116 Cellulitis of left lower limb: Secondary | ICD-10-CM | POA: Diagnosis not present

## 2015-04-24 DIAGNOSIS — F0281 Dementia in other diseases classified elsewhere with behavioral disturbance: Secondary | ICD-10-CM | POA: Diagnosis not present

## 2015-04-24 DIAGNOSIS — R451 Restlessness and agitation: Secondary | ICD-10-CM | POA: Diagnosis not present

## 2015-04-24 DIAGNOSIS — G0430 Acute necrotizing hemorrhagic encephalopathy, unspecified: Secondary | ICD-10-CM | POA: Diagnosis not present

## 2015-04-25 DIAGNOSIS — G0430 Acute necrotizing hemorrhagic encephalopathy, unspecified: Secondary | ICD-10-CM | POA: Diagnosis not present

## 2015-04-26 DIAGNOSIS — G0430 Acute necrotizing hemorrhagic encephalopathy, unspecified: Secondary | ICD-10-CM | POA: Diagnosis not present

## 2015-04-27 DIAGNOSIS — G0430 Acute necrotizing hemorrhagic encephalopathy, unspecified: Secondary | ICD-10-CM | POA: Diagnosis not present

## 2015-04-28 DIAGNOSIS — G0430 Acute necrotizing hemorrhagic encephalopathy, unspecified: Secondary | ICD-10-CM | POA: Diagnosis not present

## 2015-04-29 DIAGNOSIS — G0430 Acute necrotizing hemorrhagic encephalopathy, unspecified: Secondary | ICD-10-CM | POA: Diagnosis not present

## 2015-04-30 DIAGNOSIS — G0430 Acute necrotizing hemorrhagic encephalopathy, unspecified: Secondary | ICD-10-CM | POA: Diagnosis not present

## 2015-05-01 DIAGNOSIS — M25579 Pain in unspecified ankle and joints of unspecified foot: Secondary | ICD-10-CM | POA: Diagnosis not present

## 2015-05-01 DIAGNOSIS — G0430 Acute necrotizing hemorrhagic encephalopathy, unspecified: Secondary | ICD-10-CM | POA: Diagnosis not present

## 2015-05-02 DIAGNOSIS — G0430 Acute necrotizing hemorrhagic encephalopathy, unspecified: Secondary | ICD-10-CM | POA: Diagnosis not present

## 2015-05-03 DIAGNOSIS — G0430 Acute necrotizing hemorrhagic encephalopathy, unspecified: Secondary | ICD-10-CM | POA: Diagnosis not present

## 2015-05-04 DIAGNOSIS — G0430 Acute necrotizing hemorrhagic encephalopathy, unspecified: Secondary | ICD-10-CM | POA: Diagnosis not present

## 2015-05-05 DIAGNOSIS — G0430 Acute necrotizing hemorrhagic encephalopathy, unspecified: Secondary | ICD-10-CM | POA: Diagnosis not present

## 2015-05-06 DIAGNOSIS — G0430 Acute necrotizing hemorrhagic encephalopathy, unspecified: Secondary | ICD-10-CM | POA: Diagnosis not present

## 2015-05-07 DIAGNOSIS — G0430 Acute necrotizing hemorrhagic encephalopathy, unspecified: Secondary | ICD-10-CM | POA: Diagnosis not present

## 2015-05-08 DIAGNOSIS — G0430 Acute necrotizing hemorrhagic encephalopathy, unspecified: Secondary | ICD-10-CM | POA: Diagnosis not present

## 2015-05-09 DIAGNOSIS — G0430 Acute necrotizing hemorrhagic encephalopathy, unspecified: Secondary | ICD-10-CM | POA: Diagnosis not present

## 2015-05-10 DIAGNOSIS — G0430 Acute necrotizing hemorrhagic encephalopathy, unspecified: Secondary | ICD-10-CM | POA: Diagnosis not present

## 2015-05-11 DIAGNOSIS — G0430 Acute necrotizing hemorrhagic encephalopathy, unspecified: Secondary | ICD-10-CM | POA: Diagnosis not present

## 2015-05-12 DIAGNOSIS — G0430 Acute necrotizing hemorrhagic encephalopathy, unspecified: Secondary | ICD-10-CM | POA: Diagnosis not present

## 2015-05-13 DIAGNOSIS — G0430 Acute necrotizing hemorrhagic encephalopathy, unspecified: Secondary | ICD-10-CM | POA: Diagnosis not present

## 2015-05-14 DIAGNOSIS — G0430 Acute necrotizing hemorrhagic encephalopathy, unspecified: Secondary | ICD-10-CM | POA: Diagnosis not present

## 2015-05-15 DIAGNOSIS — G0430 Acute necrotizing hemorrhagic encephalopathy, unspecified: Secondary | ICD-10-CM | POA: Diagnosis not present

## 2015-05-16 ENCOUNTER — Encounter (HOSPITAL_COMMUNITY): Payer: Self-pay

## 2015-05-16 ENCOUNTER — Emergency Department (HOSPITAL_COMMUNITY)
Admission: EM | Admit: 2015-05-16 | Discharge: 2015-05-16 | Disposition: A | Discharge status: Home / Self Care | Payer: Commercial Managed Care - HMO | Attending: Emergency Medicine | Admitting: Emergency Medicine

## 2015-05-16 ENCOUNTER — Emergency Department (HOSPITAL_COMMUNITY): Payer: Commercial Managed Care - HMO

## 2015-05-16 DIAGNOSIS — F419 Anxiety disorder, unspecified: Secondary | ICD-10-CM | POA: Insufficient documentation

## 2015-05-16 DIAGNOSIS — W1839XA Other fall on same level, initial encounter: Secondary | ICD-10-CM | POA: Insufficient documentation

## 2015-05-16 DIAGNOSIS — S0083XA Contusion of other part of head, initial encounter: Secondary | ICD-10-CM | POA: Diagnosis not present

## 2015-05-16 DIAGNOSIS — Y998 Other external cause status: Secondary | ICD-10-CM | POA: Diagnosis not present

## 2015-05-16 DIAGNOSIS — Z87440 Personal history of urinary (tract) infections: Secondary | ICD-10-CM | POA: Diagnosis not present

## 2015-05-16 DIAGNOSIS — Y9289 Other specified places as the place of occurrence of the external cause: Secondary | ICD-10-CM | POA: Diagnosis not present

## 2015-05-16 DIAGNOSIS — G0430 Acute necrotizing hemorrhagic encephalopathy, unspecified: Secondary | ICD-10-CM | POA: Diagnosis not present

## 2015-05-16 DIAGNOSIS — Z8701 Personal history of pneumonia (recurrent): Secondary | ICD-10-CM | POA: Diagnosis not present

## 2015-05-16 DIAGNOSIS — Y9389 Activity, other specified: Secondary | ICD-10-CM | POA: Diagnosis not present

## 2015-05-16 DIAGNOSIS — Z7982 Long term (current) use of aspirin: Secondary | ICD-10-CM | POA: Diagnosis not present

## 2015-05-16 DIAGNOSIS — Z86010 Personal history of colonic polyps: Secondary | ICD-10-CM | POA: Diagnosis not present

## 2015-05-16 DIAGNOSIS — E039 Hypothyroidism, unspecified: Secondary | ICD-10-CM | POA: Diagnosis not present

## 2015-05-16 DIAGNOSIS — Z8619 Personal history of other infectious and parasitic diseases: Secondary | ICD-10-CM | POA: Insufficient documentation

## 2015-05-16 DIAGNOSIS — R011 Cardiac murmur, unspecified: Secondary | ICD-10-CM | POA: Diagnosis not present

## 2015-05-16 DIAGNOSIS — Z87891 Personal history of nicotine dependence: Secondary | ICD-10-CM | POA: Diagnosis not present

## 2015-05-16 DIAGNOSIS — I129 Hypertensive chronic kidney disease with stage 1 through stage 4 chronic kidney disease, or unspecified chronic kidney disease: Secondary | ICD-10-CM | POA: Diagnosis not present

## 2015-05-16 DIAGNOSIS — S199XXA Unspecified injury of neck, initial encounter: Secondary | ICD-10-CM | POA: Diagnosis not present

## 2015-05-16 DIAGNOSIS — S0093XA Contusion of unspecified part of head, initial encounter: Secondary | ICD-10-CM | POA: Diagnosis not present

## 2015-05-16 DIAGNOSIS — N189 Chronic kidney disease, unspecified: Secondary | ICD-10-CM | POA: Diagnosis not present

## 2015-05-16 DIAGNOSIS — R259 Unspecified abnormal involuntary movements: Secondary | ICD-10-CM | POA: Diagnosis not present

## 2015-05-16 DIAGNOSIS — R410 Disorientation, unspecified: Secondary | ICD-10-CM | POA: Diagnosis not present

## 2015-05-16 DIAGNOSIS — J45909 Unspecified asthma, uncomplicated: Secondary | ICD-10-CM | POA: Diagnosis not present

## 2015-05-16 DIAGNOSIS — M1990 Unspecified osteoarthritis, unspecified site: Secondary | ICD-10-CM | POA: Insufficient documentation

## 2015-05-16 DIAGNOSIS — S0990XA Unspecified injury of head, initial encounter: Secondary | ICD-10-CM | POA: Diagnosis not present

## 2015-05-16 DIAGNOSIS — Z79899 Other long term (current) drug therapy: Secondary | ICD-10-CM | POA: Insufficient documentation

## 2015-05-16 DIAGNOSIS — I209 Angina pectoris, unspecified: Secondary | ICD-10-CM | POA: Diagnosis not present

## 2015-05-16 DIAGNOSIS — W19XXXA Unspecified fall, initial encounter: Secondary | ICD-10-CM

## 2015-05-16 DIAGNOSIS — T148XXA Other injury of unspecified body region, initial encounter: Secondary | ICD-10-CM

## 2015-05-16 DIAGNOSIS — F0390 Unspecified dementia without behavioral disturbance: Secondary | ICD-10-CM | POA: Insufficient documentation

## 2015-05-16 DIAGNOSIS — R4182 Altered mental status, unspecified: Secondary | ICD-10-CM | POA: Diagnosis not present

## 2015-05-16 NOTE — ED Notes (Signed)
Notified son [name] about pt's plan for discharge.

## 2015-05-16 NOTE — ED Notes (Signed)
See downtime record

## 2015-05-17 DIAGNOSIS — G0430 Acute necrotizing hemorrhagic encephalopathy, unspecified: Secondary | ICD-10-CM | POA: Diagnosis not present

## 2015-05-18 DIAGNOSIS — G0430 Acute necrotizing hemorrhagic encephalopathy, unspecified: Secondary | ICD-10-CM | POA: Diagnosis not present

## 2015-05-19 DIAGNOSIS — G0430 Acute necrotizing hemorrhagic encephalopathy, unspecified: Secondary | ICD-10-CM | POA: Diagnosis not present

## 2015-05-20 DIAGNOSIS — G0430 Acute necrotizing hemorrhagic encephalopathy, unspecified: Secondary | ICD-10-CM | POA: Diagnosis not present

## 2015-05-21 DIAGNOSIS — G0430 Acute necrotizing hemorrhagic encephalopathy, unspecified: Secondary | ICD-10-CM | POA: Diagnosis not present

## 2015-05-22 DIAGNOSIS — W1830XD Fall on same level, unspecified, subsequent encounter: Secondary | ICD-10-CM | POA: Diagnosis not present

## 2015-05-22 DIAGNOSIS — G0430 Acute necrotizing hemorrhagic encephalopathy, unspecified: Secondary | ICD-10-CM | POA: Diagnosis not present

## 2015-05-22 DIAGNOSIS — F0281 Dementia in other diseases classified elsewhere with behavioral disturbance: Secondary | ICD-10-CM | POA: Diagnosis not present

## 2015-05-23 DIAGNOSIS — G0430 Acute necrotizing hemorrhagic encephalopathy, unspecified: Secondary | ICD-10-CM | POA: Diagnosis not present

## 2015-05-24 DIAGNOSIS — G0430 Acute necrotizing hemorrhagic encephalopathy, unspecified: Secondary | ICD-10-CM | POA: Diagnosis not present

## 2015-05-25 DIAGNOSIS — G0430 Acute necrotizing hemorrhagic encephalopathy, unspecified: Secondary | ICD-10-CM | POA: Diagnosis not present

## 2015-05-26 DIAGNOSIS — G0430 Acute necrotizing hemorrhagic encephalopathy, unspecified: Secondary | ICD-10-CM | POA: Diagnosis not present

## 2015-05-27 DIAGNOSIS — G0430 Acute necrotizing hemorrhagic encephalopathy, unspecified: Secondary | ICD-10-CM | POA: Diagnosis not present

## 2015-05-28 DIAGNOSIS — G0430 Acute necrotizing hemorrhagic encephalopathy, unspecified: Secondary | ICD-10-CM | POA: Diagnosis not present

## 2015-05-29 DIAGNOSIS — G0430 Acute necrotizing hemorrhagic encephalopathy, unspecified: Secondary | ICD-10-CM | POA: Diagnosis not present

## 2015-05-30 DIAGNOSIS — G0430 Acute necrotizing hemorrhagic encephalopathy, unspecified: Secondary | ICD-10-CM | POA: Diagnosis not present

## 2015-05-31 DIAGNOSIS — G0430 Acute necrotizing hemorrhagic encephalopathy, unspecified: Secondary | ICD-10-CM | POA: Diagnosis not present

## 2015-06-01 DIAGNOSIS — G0430 Acute necrotizing hemorrhagic encephalopathy, unspecified: Secondary | ICD-10-CM | POA: Diagnosis not present

## 2015-06-02 DIAGNOSIS — G0430 Acute necrotizing hemorrhagic encephalopathy, unspecified: Secondary | ICD-10-CM | POA: Diagnosis not present

## 2015-06-03 DIAGNOSIS — G0430 Acute necrotizing hemorrhagic encephalopathy, unspecified: Secondary | ICD-10-CM | POA: Diagnosis not present

## 2015-06-04 DIAGNOSIS — G0430 Acute necrotizing hemorrhagic encephalopathy, unspecified: Secondary | ICD-10-CM | POA: Diagnosis not present

## 2015-06-05 DIAGNOSIS — G0430 Acute necrotizing hemorrhagic encephalopathy, unspecified: Secondary | ICD-10-CM | POA: Diagnosis not present

## 2015-06-06 DIAGNOSIS — G0430 Acute necrotizing hemorrhagic encephalopathy, unspecified: Secondary | ICD-10-CM | POA: Diagnosis not present

## 2015-06-07 DIAGNOSIS — G0430 Acute necrotizing hemorrhagic encephalopathy, unspecified: Secondary | ICD-10-CM | POA: Diagnosis not present

## 2015-06-08 ENCOUNTER — Emergency Department (HOSPITAL_COMMUNITY): Payer: Commercial Managed Care - HMO

## 2015-06-08 ENCOUNTER — Emergency Department (HOSPITAL_COMMUNITY)
Admission: EM | Admit: 2015-06-08 | Discharge: 2015-06-09 | Disposition: A | Discharge status: Home / Self Care | Payer: Commercial Managed Care - HMO | Attending: Emergency Medicine | Admitting: Emergency Medicine

## 2015-06-08 ENCOUNTER — Encounter (HOSPITAL_COMMUNITY): Payer: Self-pay | Admitting: *Deleted

## 2015-06-08 DIAGNOSIS — R011 Cardiac murmur, unspecified: Secondary | ICD-10-CM | POA: Insufficient documentation

## 2015-06-08 DIAGNOSIS — E039 Hypothyroidism, unspecified: Secondary | ICD-10-CM | POA: Insufficient documentation

## 2015-06-08 DIAGNOSIS — R102 Pelvic and perineal pain: Secondary | ICD-10-CM | POA: Diagnosis not present

## 2015-06-08 DIAGNOSIS — S299XXA Unspecified injury of thorax, initial encounter: Secondary | ICD-10-CM | POA: Diagnosis not present

## 2015-06-08 DIAGNOSIS — Z8619 Personal history of other infectious and parasitic diseases: Secondary | ICD-10-CM | POA: Insufficient documentation

## 2015-06-08 DIAGNOSIS — I129 Hypertensive chronic kidney disease with stage 1 through stage 4 chronic kidney disease, or unspecified chronic kidney disease: Secondary | ICD-10-CM | POA: Diagnosis not present

## 2015-06-08 DIAGNOSIS — R296 Repeated falls: Secondary | ICD-10-CM | POA: Diagnosis not present

## 2015-06-08 DIAGNOSIS — F329 Major depressive disorder, single episode, unspecified: Secondary | ICD-10-CM | POA: Diagnosis not present

## 2015-06-08 DIAGNOSIS — S199XXA Unspecified injury of neck, initial encounter: Secondary | ICD-10-CM | POA: Diagnosis not present

## 2015-06-08 DIAGNOSIS — Y9289 Other specified places as the place of occurrence of the external cause: Secondary | ICD-10-CM | POA: Insufficient documentation

## 2015-06-08 DIAGNOSIS — Z8701 Personal history of pneumonia (recurrent): Secondary | ICD-10-CM | POA: Insufficient documentation

## 2015-06-08 DIAGNOSIS — F419 Anxiety disorder, unspecified: Secondary | ICD-10-CM | POA: Diagnosis not present

## 2015-06-08 DIAGNOSIS — T1490 Injury, unspecified: Secondary | ICD-10-CM | POA: Diagnosis not present

## 2015-06-08 DIAGNOSIS — Z79899 Other long term (current) drug therapy: Secondary | ICD-10-CM | POA: Insufficient documentation

## 2015-06-08 DIAGNOSIS — J45909 Unspecified asthma, uncomplicated: Secondary | ICD-10-CM | POA: Diagnosis not present

## 2015-06-08 DIAGNOSIS — T148 Other injury of unspecified body region: Secondary | ICD-10-CM | POA: Diagnosis not present

## 2015-06-08 DIAGNOSIS — S0990XA Unspecified injury of head, initial encounter: Secondary | ICD-10-CM | POA: Diagnosis not present

## 2015-06-08 DIAGNOSIS — W050XXA Fall from non-moving wheelchair, initial encounter: Secondary | ICD-10-CM

## 2015-06-08 DIAGNOSIS — R400 Somnolence: Secondary | ICD-10-CM | POA: Diagnosis not present

## 2015-06-08 DIAGNOSIS — Y998 Other external cause status: Secondary | ICD-10-CM | POA: Diagnosis not present

## 2015-06-08 DIAGNOSIS — I209 Angina pectoris, unspecified: Secondary | ICD-10-CM | POA: Insufficient documentation

## 2015-06-08 DIAGNOSIS — R4182 Altered mental status, unspecified: Secondary | ICD-10-CM | POA: Diagnosis not present

## 2015-06-08 DIAGNOSIS — N189 Chronic kidney disease, unspecified: Secondary | ICD-10-CM | POA: Insufficient documentation

## 2015-06-08 DIAGNOSIS — M1990 Unspecified osteoarthritis, unspecified site: Secondary | ICD-10-CM | POA: Diagnosis not present

## 2015-06-08 DIAGNOSIS — Y9389 Activity, other specified: Secondary | ICD-10-CM | POA: Diagnosis not present

## 2015-06-08 DIAGNOSIS — Z87891 Personal history of nicotine dependence: Secondary | ICD-10-CM | POA: Insufficient documentation

## 2015-06-08 DIAGNOSIS — Z87440 Personal history of urinary (tract) infections: Secondary | ICD-10-CM | POA: Diagnosis not present

## 2015-06-08 DIAGNOSIS — F0390 Unspecified dementia without behavioral disturbance: Secondary | ICD-10-CM | POA: Diagnosis not present

## 2015-06-08 DIAGNOSIS — W19XXXA Unspecified fall, initial encounter: Secondary | ICD-10-CM

## 2015-06-08 LAB — CBC WITH DIFFERENTIAL/PLATELET
BASOS PCT: 0 %
Basophils Absolute: 0 10*3/uL (ref 0.0–0.1)
EOS ABS: 0.1 10*3/uL (ref 0.0–0.7)
Eosinophils Relative: 1 %
HEMATOCRIT: 38.3 % (ref 36.0–46.0)
HEMOGLOBIN: 12.8 g/dL (ref 12.0–15.0)
LYMPHS ABS: 2.1 10*3/uL (ref 0.7–4.0)
Lymphocytes Relative: 18 %
MCH: 28.4 pg (ref 26.0–34.0)
MCHC: 33.4 g/dL (ref 30.0–36.0)
MCV: 85.1 fL (ref 78.0–100.0)
Monocytes Absolute: 0.6 10*3/uL (ref 0.1–1.0)
Monocytes Relative: 5 %
NEUTROS ABS: 9.3 10*3/uL — AB (ref 1.7–7.7)
NEUTROS PCT: 76 %
Platelets: 251 10*3/uL (ref 150–400)
RBC: 4.5 MIL/uL (ref 3.87–5.11)
RDW: 12.9 % (ref 11.5–15.5)
WBC: 12.1 10*3/uL — AB (ref 4.0–10.5)

## 2015-06-08 LAB — I-STAT CHEM 8, ED
BUN: 19 mg/dL (ref 6–20)
CALCIUM ION: 1.21 mmol/L (ref 1.13–1.30)
CREATININE: 0.8 mg/dL (ref 0.44–1.00)
Chloride: 104 mmol/L (ref 101–111)
Glucose, Bld: 110 mg/dL — ABNORMAL HIGH (ref 65–99)
HEMATOCRIT: 41 % (ref 36.0–46.0)
HEMOGLOBIN: 13.9 g/dL (ref 12.0–15.0)
Potassium: 4.1 mmol/L (ref 3.5–5.1)
SODIUM: 142 mmol/L (ref 135–145)
TCO2: 26 mmol/L (ref 0–100)

## 2015-06-08 LAB — I-STAT VENOUS BLOOD GAS, ED
ACID-BASE EXCESS: 2 mmol/L (ref 0.0–2.0)
Bicarbonate: 27.3 mEq/L — ABNORMAL HIGH (ref 20.0–24.0)
O2 SAT: 90 %
PCO2 VEN: 43.3 mmHg — AB (ref 45.0–50.0)
TCO2: 29 mmol/L (ref 0–100)
pH, Ven: 7.409 — ABNORMAL HIGH (ref 7.250–7.300)
pO2, Ven: 58 mmHg — ABNORMAL HIGH (ref 30.0–45.0)

## 2015-06-08 LAB — COMPREHENSIVE METABOLIC PANEL
ALT: 20 U/L (ref 14–54)
ANION GAP: 13 (ref 5–15)
AST: 18 U/L (ref 15–41)
Albumin: 3.7 g/dL (ref 3.5–5.0)
Alkaline Phosphatase: 78 U/L (ref 38–126)
BUN: 16 mg/dL (ref 6–20)
CHLORIDE: 103 mmol/L (ref 101–111)
CO2: 26 mmol/L (ref 22–32)
CREATININE: 0.88 mg/dL (ref 0.44–1.00)
Calcium: 10.1 mg/dL (ref 8.9–10.3)
GFR, EST NON AFRICAN AMERICAN: 55 mL/min — AB (ref >60)
Glucose, Bld: 115 mg/dL — ABNORMAL HIGH (ref 65–99)
Potassium: 4.3 mmol/L (ref 3.5–5.1)
Sodium: 142 mmol/L (ref 135–145)
Total Bilirubin: 0.2 mg/dL — ABNORMAL LOW (ref 0.3–1.2)
Total Protein: 7.1 g/dL (ref 6.5–8.1)

## 2015-06-08 LAB — CBG MONITORING, ED: Glucose-Capillary: 107 mg/dL — ABNORMAL HIGH (ref 65–99)

## 2015-06-08 LAB — I-STAT TROPONIN, ED: TROPONIN I, POC: 0 ng/mL (ref 0.00–0.08)

## 2015-06-08 LAB — I-STAT CG4 LACTIC ACID, ED: LACTIC ACID, VENOUS: 0.8 mmol/L (ref 0.5–2.0)

## 2015-06-08 LAB — APTT: aPTT: 28 seconds (ref 24–37)

## 2015-06-08 LAB — PROTIME-INR
INR: 1.04 (ref 0.00–1.49)
PROTHROMBIN TIME: 13.8 s (ref 11.6–15.2)

## 2015-06-08 MED ORDER — NALOXONE HCL 0.4 MG/ML IJ SOLN
0.4000 mg | Freq: Once | INTRAMUSCULAR | Status: AC
  Administered 2015-06-08: 0.4 mg via INTRAVENOUS
  Filled 2015-06-08: qty 1
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
## 2015-06-08 NOTE — ED Provider Notes (Signed)
CSN: [id number]     Arrival date & time 06/08/15  2053 History   First MD Initiated Contact with Patient 06/08/15 2057     Chief Complaint  Patient presents with  . Fall  . Altered Mental Status   82 year old Caucasian female with PMH of dementia, HTN, hypothyroidism, depression who presents from assisted nursing facility after mechanical fall. Patient had just received her nightly medications including tramadol and antipsychotic when she became sluggish, fell forward out of her wheelchair and struck her head on the floor. EMS arrived and found patient breathing spontaneously but with pinpoint pupils and was not alert. She would spontaneously move her arms and would respond to sternal rub only.  No report of patient being ill prior to this event.  (Consider location/radiation/quality/duration/timing/severity/associated sxs/prior Treatment) Patient is a 82 y.o. female presenting with fall.  Fall This is a new problem. The current episode started today. The problem occurs rarely. Associated symptoms comments: No complaints. Nothing aggravates the symptoms. She has tried nothing for the symptoms. The treatment provided no relief.    Past Medical History  Diagnosis Date  . Arthritis   . Chicken pox   . Chronic kidney disease   . Colon polyps   . Hypertension   . Dementia   . Angina   . Dysrhythmia   . Heart murmur   . Shortness of breath   . Recurrent upper respiratory infection (URI)   . Pneumonia   . Hypothyroidism   . Anemia   . Headache(784.0)   . Anxiety   . Depression   . Valvular heart disease 09/04/2012  . Left leg pain 10/02/2012  . UTI (urinary tract infection) 12/30/2012  . Acute bronchitis 04/10/2013  . Dehydration 08/31/2013  . Arthritis 10/02/2012  . Asthma    Past Surgical History  Procedure Laterality Date  . Appendectomy    . Cholecystectomy    . Kidney stones    . Tonsillectomy    . Abdominal hysterectomy    . Hand surgery    . Feet surgery    . Shoulder  arthroscopy    . Back surgery    . Tonsillectomy    . Eye surgery      cataract removed and eye lids lifted  . Fracture surgery      bilateral arms  . Tubal ligation    . Colonoscopy w/ polypectomy     Family History  Problem Relation Age of Onset  . Heart disease Mother   . Heart disease Father   . Heart disease Other   . Birth defects Other    Social History  Substance Use Topics  . Smoking status: Former Smoker -- 0.20 packs/day for 1 years    Types: Cigarettes    Quit date: 06/02/1941  . Smokeless tobacco: Never Used  . Alcohol Use: No   OB History    No data available     Review of Systems  Unable to perform ROS: Patient unresponsive      Allergies  Morphine and related; Codeine; and Hydrocodone  Home Medications   Prior to Admission medications   Medication Sig Start Date End Date Taking? Authorizing Provider  acetaminophen (TYLENOL) 500 MG tablet Take 500 mg by mouth every 4 (four) hours as needed for mild pain, fever or headache.   Yes Historical Provider, MD  albuterol (PROVENTIL HFA;VENTOLIN HFA) 108 (90 BASE) MCG/ACT inhaler Inhale 2 puffs into the lungs every 6 (six) hours as needed for wheezing or shortness of breath.  09/23/13  Yes Sandford CrazeMelissa O'Sullivan, NP  alum & mag hydroxide-simeth (MAALOX/MYLANTA) 200-200-20 MG/5ML suspension Take 30 mLs by mouth every 6 (six) hours as needed for indigestion or heartburn.   Yes Historical Provider, MD  aspirin 81 MG chewable tablet Chew 81 mg by mouth daily.   Yes Historical Provider, MD  clonazePAM (KLONOPIN) 0.5 MG tablet Take 0.5 mg by mouth 3 (three) times daily.    Yes Historical Provider, MD  clonazePAM (KLONOPIN) 0.5 MG tablet Take 0.5 mg by mouth every 4 (four) hours as needed for anxiety.   Yes Historical Provider, MD  guaiFENesin (Q-TUSSIN) 100 MG/5ML liquid Take 200 mg by mouth every 6 (six) hours as needed for cough.   Yes Historical Provider, MD  levothyroxine (SYNTHROID, LEVOTHROID) 50 MCG tablet TAKE 1  TABLET (50 MCG TOTAL) BY MOUTH DAILY. 09/25/14  Yes Myrlene BrokerElizabeth A Crawford, MD  loperamide (IMODIUM) 2 MG capsule Take 2 mg by mouth every 3 (three) hours as needed for diarrhea or loose stools.    Yes Historical Provider, MD  magnesium hydroxide (MILK OF MAGNESIA) 400 MG/5ML suspension Take 30 mLs by mouth at bedtime as needed for mild constipation.   Yes Historical Provider, MD  neomycin-bacitracin-polymyxin (NEOSPORIN) ointment Apply 1 application topically daily as needed for wound care.    Yes Historical Provider, MD  risperiDONE (RISPERDAL) 2 MG tablet Take 1 mg by mouth at bedtime.    Yes Historical Provider, MD  rivastigmine (EXELON) 3 MG capsule Take 3 mg by mouth 2 (two) times daily.   Yes Historical Provider, MD  senna-docusate (SENOKOT-S) 8.6-50 MG tablet Take 1 tablet by mouth 2 (two) times daily.   Yes Historical Provider, MD  traZODone (DESYREL) 50 MG tablet Take 50 mg by mouth at bedtime as needed for sleep.   Yes Historical Provider, MD   BP 150/89 mmHg  Pulse 91  Temp(Src) 98.9 F (37.2 C) (Axillary)  Resp 17  SpO2 93% Physical Exam  Constitutional: She appears well-developed and well-nourished. No distress.  HENT:  Head: Normocephalic and atraumatic.  Eyes:  Pinpoint pupils  Cardiovascular: Normal rate, regular rhythm, normal heart sounds and intact distal pulses.  Exam reveals no gallop and no friction rub.   No murmur heard. Pulmonary/Chest: Effort normal and breath sounds normal. No respiratory distress. She has no wheezes. She has no rales. She exhibits no tenderness.  Abdominal: Soft. Bowel sounds are normal. She exhibits no distension and no mass. There is no tenderness. There is no rebound and no guarding.  Lymphadenopathy:    She has no cervical adenopathy.  Skin: Skin is warm and dry. She is not diaphoretic.  Nursing note and vitals reviewed.   ED Course  Procedures (including critical care time) Labs Review Labs Reviewed  COMPREHENSIVE METABOLIC PANEL -  Abnormal; Notable for the following:    Glucose, Bld 115 (*)    Total Bilirubin 0.2 (*)    GFR calc non Af Amer 55 (*)    All other components within normal limits  CBC WITH DIFFERENTIAL/PLATELET - Abnormal; Notable for the following:    WBC 12.1 (*)    Neutro Abs 9.3 (*)    All other components within normal limits  CBG MONITORING, ED - Abnormal; Notable for the following:    Glucose-Capillary 107 (*)    All other components within normal limits  I-STAT CHEM 8, ED - Abnormal; Notable for the following:    Glucose, Bld 110 (*)    All other components within normal limits  I-STAT VENOUS BLOOD GAS, ED - Abnormal; Notable for the following:    pH, Ven 7.409 (*)    pCO2, Ven 43.3 (*)    pO2, Ven 58.0 (*)    Bicarbonate 27.3 (*)    All other components within normal limits  PROTIME-INR  APTT  I-STAT CG4 LACTIC ACID, ED  I-STAT TROPOININ, ED    Imaging Review Ct Head Wo Contrast  06/08/2015  CLINICAL DATA:  Unwitnessed fall. EXAM: CT HEAD WITHOUT CONTRAST CT CERVICAL SPINE WITHOUT CONTRAST TECHNIQUE: Multidetector CT imaging of the head and cervical spine was performed following the standard protocol without intravenous contrast. Multiplanar CT image reconstructions of the cervical spine were also generated. COMPARISON:  05/16/2015 FINDINGS: CT HEAD FINDINGS There is prominence of the sulci and ventricles consistent with brain atrophy. Low attenuation within the subcortical and periventricular white matter is noted compatible with chronic microvascular disease. No abnormal extra-axial fluid collection, intracranial hemorrhage or mass identified. The mastoid air cells are clear. There is mucosal thickening involving the maxillaries sinuses as well as partial opacification of the frontal sinus and sphenoid sinus. The calvarium appears intact. CT CERVICAL SPINE FINDINGS The alignment of the cervical spine is normal. The vertebral body heights are well preserved. There is mild multi level disc  space narrowing and ventral spurring noted. The prevertebral soft tissue space is normal. The facet joints are well-aligned. No fracture or subluxation identified. IMPRESSION: 1. No acute intracranial abnormalities. 2. Small vessel ischemic change and brain atrophy. 3. Paranasal sinus inflammation. 4. No evidence for cervical spine fracture. 5. Mild cervical spondylosis noted. Electronically Signed   By: Signa Kell M.D.   On: 06/08/2015 22:00   Ct Cervical Spine Wo Contrast  06/08/2015  CLINICAL DATA:  Unwitnessed fall. EXAM: CT HEAD WITHOUT CONTRAST CT CERVICAL SPINE WITHOUT CONTRAST TECHNIQUE: Multidetector CT imaging of the head and cervical spine was performed following the standard protocol without intravenous contrast. Multiplanar CT image reconstructions of the cervical spine were also generated. COMPARISON:  05/16/2015 FINDINGS: CT HEAD FINDINGS There is prominence of the sulci and ventricles consistent with brain atrophy. Low attenuation within the subcortical and periventricular white matter is noted compatible with chronic microvascular disease. No abnormal extra-axial fluid collection, intracranial hemorrhage or mass identified. The mastoid air cells are clear. There is mucosal thickening involving the maxillaries sinuses as well as partial opacification of the frontal sinus and sphenoid sinus. The calvarium appears intact. CT CERVICAL SPINE FINDINGS The alignment of the cervical spine is normal. The vertebral body heights are well preserved. There is mild multi level disc space narrowing and ventral spurring noted. The prevertebral soft tissue space is normal. The facet joints are well-aligned. No fracture or subluxation identified. IMPRESSION: 1. No acute intracranial abnormalities. 2. Small vessel ischemic change and brain atrophy. 3. Paranasal sinus inflammation. 4. No evidence for cervical spine fracture. 5. Mild cervical spondylosis noted. Electronically Signed   By: Signa Kell M.D.   On:  06/08/2015 22:00   Dg Pelvis Portable  06/08/2015  CLINICAL DATA:  fallen from her wheel chair and has become unresponsive except to painful stimuli - pt w/ hx of alzhiemers disease EXAM: PORTABLE PELVIS 1-2 VIEWS COMPARISON:  11/27/2014 FINDINGS: No fracture. No bone lesion. Hip joints, SI joints and symphysis pubis are normally aligned. Soft tissues are unremarkable. IMPRESSION: No fracture or acute finding. Electronically Signed   By: Amie Portland M.D.   On: 06/08/2015 21:14   Dg Chest Portable 1 View  06/08/2015  CLINICAL DATA:  Fall from wheelchair, unresponsive except to painful stimuli. History of Alzheimer's disease. EXAM: PORTABLE CHEST 1 VIEW COMPARISON:  Chest x-ray dated 02/12/2015. FINDINGS: Heart size is upper normal, stable. Overall cardiomediastinal silhouette is stable in size and configuration. Lungs are clear. No pleural effusion. No pneumothorax seen. Degenerative changes again noted at the right shoulder. No acute osseous abnormality seen. IMPRESSION: Stable chest x-ray. Lungs are clear and there is no evidence of acute cardiopulmonary abnormality. Electronically Signed   By: Bary Richard M.D.   On: 06/08/2015 21:14   I have personally reviewed and evaluated these images and lab results as part of my medical decision-making.   EKG Interpretation   Date/Time:  Friday June 08 2015 20:59:33 EST Ventricular Rate:  95 PR Interval:  234 QRS Duration: 152 QT Interval:  380 QTC Calculation: 478 R Axis:   -81 Text Interpretation:  Sinus rhythm Prolonged PR interval Right bundle  branch block Inferior infarct, old Lateral leads are also involved No  significant change since last tracing Confirmed by FLOYD MD, Reuel Boom  [phone]) on 06/08/2015 10:24:10 PM      MDM   Final diagnoses:  Fall from wheelchair, initial encounter   Witnessed fall from wheelchair after becoming drowsy from medications. See HPI for details. On exam, pt has pinpoint pupils, is breathing normally. C/w  narcotic overdose. Imaging obtained shows no IC abnormality, no fracture. Exam shows no trauma.  Narcan given, pt now conversing. Stable for return back to facility.    Pt was seen under the supervision of Dr. Adela Lank.     Rachelle Hora, MD 06/08/15 2322  Melene Plan, DO 06/09/15 1422

## 2015-06-08 NOTE — ED Notes (Signed)
Pt more alert at this time, requesting her c-collar be removed however not opening her eyes while talking.

## 2015-06-08 NOTE — Discharge Instructions (Signed)
Fall Prevention in the Home  Falls can cause injuries and can affect people from all age groups. There are many simple things that you can do to make your home safe and to help prevent falls. WHAT CAN I DO ON THE OUTSIDE OF MY HOME?  Regularly repair the edges of walkways and driveways and fix any cracks.  Remove high doorway thresholds.  Trim any shrubbery on the main path into your home.  Use bright outdoor lighting.  Clear walkways of debris and clutter, including tools and rocks.  Regularly check that handrails are securely fastened and in good repair. Both sides of any steps should have handrails.  Install guardrails along the edges of any raised decks or porches.  Have leaves, snow, and ice cleared regularly.  Use sand or salt on walkways during winter months.  In the garage, clean up any spills right away, including grease or oil spills. WHAT CAN I DO IN THE BATHROOM?  Use night lights.  Install grab bars by the toilet and in the tub and shower. Do not use towel bars as grab bars.  Use non-skid mats or decals on the floor of the tub or shower.  If you need to sit down while you are in the shower, use a plastic, non-slip stool..  Keep the floor dry. Immediately clean up any water that spills on the floor.  Remove soap buildup in the tub or shower on a regular basis.  Attach bath mats securely with double-sided non-slip rug tape.  Remove throw rugs and other tripping hazards from the floor. WHAT CAN I DO IN THE BEDROOM?  Use night lights.  Make sure that a bedside light is easy to reach.  Do not use oversized bedding that drapes onto the floor.  Have a firm chair that has side arms to use for getting dressed.  Remove throw rugs and other tripping hazards from the floor. WHAT CAN I DO IN THE KITCHEN?   Clean up any spills right away.  Avoid walking on wet floors.  Place frequently used items in easy-to-reach places.  If you need to reach for something  above you, use a sturdy step stool that has a grab bar.  Keep electrical cables out of the way.  Do not use floor polish or wax that makes floors slippery. If you have to use wax, make sure that it is non-skid floor wax.  Remove throw rugs and other tripping hazards from the floor. WHAT CAN I DO IN THE STAIRWAYS?  Do not leave any items on the stairs.  Make sure that there are handrails on both sides of the stairs. Fix handrails that are broken or loose. Make sure that handrails are as long as the stairways.  Check any carpeting to make sure that it is firmly attached to the stairs. Fix any carpet that is loose or worn.  Avoid having throw rugs at the top or bottom of stairways, or secure the rugs with carpet tape to prevent them from moving.  Make sure that you have a light switch at the top of the stairs and the bottom of the stairs. If you do not have them, have them installed. WHAT ARE SOME OTHER FALL PREVENTION TIPS?  Wear closed-toe shoes that fit well and support your feet. Wear shoes that have rubber soles or low heels.  When you use a stepladder, make sure that it is completely opened and that the sides are firmly locked. Have someone hold the ladder while you   are using it. Do not climb a closed stepladder.  Add color or contrast paint or tape to grab bars and handrails in your home. Place contrasting color strips on the first and last steps.  Use mobility aids as needed, such as canes, walkers, scooters, and crutches.  Turn on lights if it is dark. Replace any light bulbs that burn out.  Set up furniture so that there are clear paths. Keep the furniture in the same spot.  Fix any uneven floor surfaces.  Choose a carpet design that does not hide the edge of steps of a stairway.  Be aware of any and all pets.  Review your medicines with your healthcare provider. Some medicines can cause dizziness or changes in blood pressure, which increase your risk of falling. Talk  with your health care provider about other ways that you can decrease your risk of falls. This may include working with a physical therapist or trainer to improve your strength, balance, and endurance.   This information is not intended to replace advice given to you by your health care provider. Make sure you discuss any questions you have with your health care provider.   Document Released: 05/09/2002 Document Revised: 10/03/2014 Document Reviewed: 06/23/2014 Elsevier Interactive Patient Education 2016 [employer]  

## 2015-06-09 DIAGNOSIS — G0430 Acute necrotizing hemorrhagic encephalopathy, unspecified: Secondary | ICD-10-CM | POA: Diagnosis not present

## 2015-06-09 DIAGNOSIS — R4182 Altered mental status, unspecified: Secondary | ICD-10-CM | POA: Diagnosis not present

## 2015-06-09 NOTE — ED Notes (Signed)
Pt continues attempts to get out of bed. Attempts at redirection. Safety sitter at bedside

## 2015-06-09 NOTE — ED Notes (Signed)
Pt drowsy at this time, however alert. Placed in wheelchair and escorted around department in attempt to distract.

## 2015-06-09 NOTE — ED Notes (Signed)
Patient continues to attempt to climb out of bed.  EMT as sitter at bedside.  Patient inching way down bed.  Table place as a barrier and patient encouraged to color.  Sitter maintaining patient safety at this time

## 2015-06-09 NOTE — ED Notes (Signed)
Pt sleeping at this time.

## 2015-06-09 NOTE — ED Notes (Signed)
Pt continues to attempt to get out of bed. RN ambulating pt around department on stretcher.

## 2015-06-09 NOTE — ED Notes (Signed)
Patient left at this time with all belongings. 

## 2015-06-10 DIAGNOSIS — G0430 Acute necrotizing hemorrhagic encephalopathy, unspecified: Secondary | ICD-10-CM | POA: Diagnosis not present

## 2015-06-11 DIAGNOSIS — G0430 Acute necrotizing hemorrhagic encephalopathy, unspecified: Secondary | ICD-10-CM | POA: Diagnosis not present

## 2015-06-12 DIAGNOSIS — G0430 Acute necrotizing hemorrhagic encephalopathy, unspecified: Secondary | ICD-10-CM | POA: Diagnosis not present

## 2015-06-13 DIAGNOSIS — G0430 Acute necrotizing hemorrhagic encephalopathy, unspecified: Secondary | ICD-10-CM | POA: Diagnosis not present

## 2015-06-14 DIAGNOSIS — G0430 Acute necrotizing hemorrhagic encephalopathy, unspecified: Secondary | ICD-10-CM | POA: Diagnosis not present

## 2015-06-15 DIAGNOSIS — G0430 Acute necrotizing hemorrhagic encephalopathy, unspecified: Secondary | ICD-10-CM | POA: Diagnosis not present

## 2015-06-16 DIAGNOSIS — G0430 Acute necrotizing hemorrhagic encephalopathy, unspecified: Secondary | ICD-10-CM | POA: Diagnosis not present

## 2015-06-17 DIAGNOSIS — G0430 Acute necrotizing hemorrhagic encephalopathy, unspecified: Secondary | ICD-10-CM | POA: Diagnosis not present

## 2015-06-18 DIAGNOSIS — G0430 Acute necrotizing hemorrhagic encephalopathy, unspecified: Secondary | ICD-10-CM | POA: Diagnosis not present

## 2015-06-19 DIAGNOSIS — R296 Repeated falls: Secondary | ICD-10-CM | POA: Diagnosis not present

## 2015-06-19 DIAGNOSIS — G309 Alzheimer's disease, unspecified: Secondary | ICD-10-CM | POA: Diagnosis not present

## 2015-06-19 DIAGNOSIS — G0430 Acute necrotizing hemorrhagic encephalopathy, unspecified: Secondary | ICD-10-CM | POA: Diagnosis not present

## 2015-06-20 DIAGNOSIS — G0430 Acute necrotizing hemorrhagic encephalopathy, unspecified: Secondary | ICD-10-CM | POA: Diagnosis not present

## 2015-06-21 DIAGNOSIS — G0430 Acute necrotizing hemorrhagic encephalopathy, unspecified: Secondary | ICD-10-CM | POA: Diagnosis not present

## 2015-06-22 DIAGNOSIS — G0430 Acute necrotizing hemorrhagic encephalopathy, unspecified: Secondary | ICD-10-CM | POA: Diagnosis not present

## 2015-06-23 DIAGNOSIS — G0430 Acute necrotizing hemorrhagic encephalopathy, unspecified: Secondary | ICD-10-CM | POA: Diagnosis not present

## 2015-06-24 DIAGNOSIS — G0430 Acute necrotizing hemorrhagic encephalopathy, unspecified: Secondary | ICD-10-CM | POA: Diagnosis not present

## 2015-06-25 DIAGNOSIS — G0430 Acute necrotizing hemorrhagic encephalopathy, unspecified: Secondary | ICD-10-CM | POA: Diagnosis not present

## 2015-06-26 ENCOUNTER — Emergency Department (HOSPITAL_COMMUNITY): Payer: Commercial Managed Care - HMO

## 2015-06-26 ENCOUNTER — Inpatient Hospital Stay (HOSPITAL_COMMUNITY)
Admission: EM | Admit: 2015-06-26 | Discharge: 2015-06-28 | DRG: 072 | Disposition: A | Discharge status: Nursing Home (Includes ICF) | Payer: Commercial Managed Care - HMO | Attending: Internal Medicine | Admitting: Internal Medicine

## 2015-06-26 ENCOUNTER — Encounter (HOSPITAL_COMMUNITY): Payer: Self-pay | Admitting: Emergency Medicine

## 2015-06-26 DIAGNOSIS — Z885 Allergy status to narcotic agent status: Secondary | ICD-10-CM | POA: Diagnosis not present

## 2015-06-26 DIAGNOSIS — G309 Alzheimer's disease, unspecified: Secondary | ICD-10-CM | POA: Diagnosis present

## 2015-06-26 DIAGNOSIS — Z7982 Long term (current) use of aspirin: Secondary | ICD-10-CM

## 2015-06-26 DIAGNOSIS — G9340 Encephalopathy, unspecified: Principal | ICD-10-CM | POA: Diagnosis present

## 2015-06-26 DIAGNOSIS — Z87891 Personal history of nicotine dependence: Secondary | ICD-10-CM

## 2015-06-26 DIAGNOSIS — R404 Transient alteration of awareness: Secondary | ICD-10-CM | POA: Diagnosis not present

## 2015-06-26 DIAGNOSIS — F329 Major depressive disorder, single episode, unspecified: Secondary | ICD-10-CM | POA: Diagnosis present

## 2015-06-26 DIAGNOSIS — R410 Disorientation, unspecified: Secondary | ICD-10-CM | POA: Diagnosis present

## 2015-06-26 DIAGNOSIS — R296 Repeated falls: Secondary | ICD-10-CM | POA: Diagnosis present

## 2015-06-26 DIAGNOSIS — Z87442 Personal history of urinary calculi: Secondary | ICD-10-CM | POA: Diagnosis not present

## 2015-06-26 DIAGNOSIS — R531 Weakness: Secondary | ICD-10-CM | POA: Diagnosis not present

## 2015-06-26 DIAGNOSIS — K5900 Constipation, unspecified: Secondary | ICD-10-CM | POA: Diagnosis present

## 2015-06-26 DIAGNOSIS — M1990 Unspecified osteoarthritis, unspecified site: Secondary | ICD-10-CM | POA: Diagnosis present

## 2015-06-26 DIAGNOSIS — R4182 Altered mental status, unspecified: Secondary | ICD-10-CM

## 2015-06-26 DIAGNOSIS — R197 Diarrhea, unspecified: Secondary | ICD-10-CM | POA: Diagnosis present

## 2015-06-26 DIAGNOSIS — F419 Anxiety disorder, unspecified: Secondary | ICD-10-CM | POA: Diagnosis present

## 2015-06-26 DIAGNOSIS — F0280 Dementia in other diseases classified elsewhere without behavioral disturbance: Secondary | ICD-10-CM | POA: Diagnosis not present

## 2015-06-26 DIAGNOSIS — F411 Generalized anxiety disorder: Secondary | ICD-10-CM | POA: Diagnosis not present

## 2015-06-26 DIAGNOSIS — N189 Chronic kidney disease, unspecified: Secondary | ICD-10-CM | POA: Diagnosis present

## 2015-06-26 DIAGNOSIS — Z8249 Family history of ischemic heart disease and other diseases of the circulatory system: Secondary | ICD-10-CM

## 2015-06-26 DIAGNOSIS — I129 Hypertensive chronic kidney disease with stage 1 through stage 4 chronic kidney disease, or unspecified chronic kidney disease: Secondary | ICD-10-CM | POA: Diagnosis not present

## 2015-06-26 DIAGNOSIS — M6281 Muscle weakness (generalized): Secondary | ICD-10-CM | POA: Diagnosis not present

## 2015-06-26 DIAGNOSIS — E039 Hypothyroidism, unspecified: Secondary | ICD-10-CM | POA: Diagnosis not present

## 2015-06-26 DIAGNOSIS — Z79899 Other long term (current) drug therapy: Secondary | ICD-10-CM | POA: Diagnosis not present

## 2015-06-26 DIAGNOSIS — G0430 Acute necrotizing hemorrhagic encephalopathy, unspecified: Secondary | ICD-10-CM | POA: Diagnosis not present

## 2015-06-26 DIAGNOSIS — F0281 Dementia in other diseases classified elsewhere with behavioral disturbance: Secondary | ICD-10-CM | POA: Diagnosis not present

## 2015-06-26 DIAGNOSIS — Z86010 Personal history of colonic polyps: Secondary | ICD-10-CM | POA: Diagnosis not present

## 2015-06-26 LAB — COMPREHENSIVE METABOLIC PANEL
ALK PHOS: 73 U/L (ref 38–126)
ALT: 14 U/L (ref 14–54)
AST: 14 U/L — AB (ref 15–41)
Albumin: 3.3 g/dL — ABNORMAL LOW (ref 3.5–5.0)
Anion gap: 8 (ref 5–15)
BUN: 16 mg/dL (ref 6–20)
CHLORIDE: 108 mmol/L (ref 101–111)
CO2: 29 mmol/L (ref 22–32)
CREATININE: 0.83 mg/dL (ref 0.44–1.00)
Calcium: 9.1 mg/dL (ref 8.9–10.3)
GFR calc Af Amer: >60 mL/min (ref >60)
GFR, EST NON AFRICAN AMERICAN: 59 mL/min — AB (ref >60)
Glucose, Bld: 102 mg/dL — ABNORMAL HIGH (ref 65–99)
Potassium: 4 mmol/L (ref 3.5–5.1)
SODIUM: 145 mmol/L (ref 135–145)
Total Bilirubin: 0.5 mg/dL (ref 0.3–1.2)
Total Protein: 6.3 g/dL — ABNORMAL LOW (ref 6.5–8.1)

## 2015-06-26 LAB — TROPONIN I

## 2015-06-26 LAB — URINE MICROSCOPIC-ADD ON

## 2015-06-26 LAB — URINALYSIS, ROUTINE W REFLEX MICROSCOPIC
BILIRUBIN URINE: NEGATIVE
Glucose, UA: NEGATIVE mg/dL
Ketones, ur: NEGATIVE mg/dL
NITRITE: NEGATIVE
PROTEIN: NEGATIVE mg/dL
SPECIFIC GRAVITY, URINE: 1.015 (ref 1.005–1.030)
pH: 5.5 (ref 5.0–8.0)

## 2015-06-26 LAB — CBC WITH DIFFERENTIAL/PLATELET
BASOS ABS: 0 10*3/uL (ref 0.0–0.1)
Basophils Relative: 0 %
EOS PCT: 2 %
Eosinophils Absolute: 0.1 10*3/uL (ref 0.0–0.7)
HCT: 32.4 % — ABNORMAL LOW (ref 36.0–46.0)
HEMOGLOBIN: 10.8 g/dL — AB (ref 12.0–15.0)
LYMPHS PCT: 23 %
Lymphs Abs: 1.9 10*3/uL (ref 0.7–4.0)
MCH: 28.5 pg (ref 26.0–34.0)
MCHC: 33.3 g/dL (ref 30.0–36.0)
MCV: 85.5 fL (ref 78.0–100.0)
Monocytes Absolute: 0.5 10*3/uL (ref 0.1–1.0)
Monocytes Relative: 6 %
NEUTROS PCT: 69 %
Neutro Abs: 6 10*3/uL (ref 1.7–7.7)
PLATELETS: 228 10*3/uL (ref 150–400)
RBC: 3.79 MIL/uL — AB (ref 3.87–5.11)
RDW: 13.1 % (ref 11.5–15.5)
WBC: 8.6 10*3/uL (ref 4.0–10.5)

## 2015-06-26 MED ORDER — ENOXAPARIN SODIUM 40 MG/0.4ML ~~LOC~~ SOLN
40.0000 mg | SUBCUTANEOUS | Status: DC
  Administered 2015-06-26 – 2015-06-27 (×2): 40 mg via SUBCUTANEOUS
  Filled 2015-06-26 (×3): qty 0.4

## 2015-06-26 MED ORDER — SODIUM CHLORIDE 0.9 % IV BOLUS (SEPSIS)
500.0000 mL | Freq: Once | INTRAVENOUS | Status: AC
  Administered 2015-06-26: 500 mL via INTRAVENOUS

## 2015-06-26 MED ORDER — GUAIFENESIN 100 MG/5ML PO SOLN: 10.0000 mL | Freq: Four times a day (QID) | ORAL | Status: DC | PRN

## 2015-06-26 MED ORDER — ONDANSETRON HCL 4 MG PO TABS: 4.0000 mg | ORAL_TABLET | Freq: Four times a day (QID) | ORAL | Status: DC | PRN

## 2015-06-26 MED ORDER — LEVOTHYROXINE SODIUM 50 MCG PO TABS
50.0000 ug | ORAL_TABLET | Freq: Every day | ORAL | Status: DC
  Administered 2015-06-27 – 2015-06-28 (×2): 50 ug via ORAL
  Filled 2015-06-26 (×3): qty 1

## 2015-06-26 MED ORDER — TRAMADOL HCL 50 MG PO TABS
50.0000 mg | ORAL_TABLET | Freq: Once | ORAL | Status: AC
  Administered 2015-06-26: 50 mg via ORAL
  Filled 2015-06-26: qty 1

## 2015-06-26 MED ORDER — ALBUTEROL SULFATE HFA 108 (90 BASE) MCG/ACT IN AERS: 2.0000 | INHALATION_SPRAY | Freq: Four times a day (QID) | RESPIRATORY_TRACT | Status: DC | PRN

## 2015-06-26 MED ORDER — RIVASTIGMINE TARTRATE 3 MG PO CAPS
3.0000 mg | ORAL_CAPSULE | Freq: Two times a day (BID) | ORAL | Status: DC
  Administered 2015-06-26 – 2015-06-28 (×3): 3 mg via ORAL
  Filled 2015-06-26 (×5): qty 1

## 2015-06-26 MED ORDER — ACETAMINOPHEN 500 MG PO TABS
500.0000 mg | ORAL_TABLET | ORAL | Status: DC | PRN
  Administered 2015-06-27: 500 mg via ORAL
  Filled 2015-06-26: qty 1

## 2015-06-26 MED ORDER — MAGNESIUM HYDROXIDE 400 MG/5ML PO SUSP: 30.0000 mL | Freq: Every evening | ORAL | Status: DC | PRN

## 2015-06-26 MED ORDER — RISPERIDONE 1 MG PO TABS
1.0000 mg | ORAL_TABLET | Freq: Every day | ORAL | Status: DC
  Administered 2015-06-26 – 2015-06-27 (×2): 1 mg via ORAL
  Filled 2015-06-26 (×3): qty 1

## 2015-06-26 MED ORDER — BACITRACIN-NEOMYCIN-POLYMYXIN 400-5-5000 EX OINT: 1.0000 "application " | TOPICAL_OINTMENT | Freq: Every day | CUTANEOUS | Status: DC | PRN

## 2015-06-26 MED ORDER — TRAZODONE HCL 50 MG PO TABS
50.0000 mg | ORAL_TABLET | Freq: Every evening | ORAL | Status: DC | PRN
  Administered 2015-06-26: 50 mg via ORAL
  Filled 2015-06-26: qty 1

## 2015-06-26 MED ORDER — ONDANSETRON HCL 4 MG/2ML IJ SOLN: 4.0000 mg | Freq: Four times a day (QID) | INTRAMUSCULAR | Status: DC | PRN

## 2015-06-26 MED ORDER — ALBUTEROL SULFATE (2.5 MG/3ML) 0.083% IN NEBU: 2.5000 mg | INHALATION_SOLUTION | Freq: Four times a day (QID) | RESPIRATORY_TRACT | Status: DC | PRN

## 2015-06-26 MED ORDER — GUAIFENESIN 100 MG/5ML PO LIQD: 200.0000 mg | Freq: Four times a day (QID) | ORAL | Status: DC | PRN

## 2015-06-26 MED ORDER — CLONAZEPAM 0.5 MG PO TABS
0.5000 mg | ORAL_TABLET | Freq: Three times a day (TID) | ORAL | Status: DC | PRN
  Administered 2015-06-27 – 2015-06-28 (×2): 0.5 mg via ORAL
  Filled 2015-06-26 (×2): qty 1

## 2015-06-26 MED ORDER — ASPIRIN 81 MG PO CHEW
81.0000 mg | CHEWABLE_TABLET | Freq: Every day | ORAL | Status: DC
  Administered 2015-06-27 – 2015-06-28 (×2): 81 mg via ORAL
  Filled 2015-06-26 (×2): qty 1

## 2015-06-26 MED ORDER — SODIUM CHLORIDE 0.9 % IV SOLN
INTRAVENOUS | Status: DC
  Administered 2015-06-26: 20:00:00 via INTRAVENOUS

## 2015-06-26 NOTE — ED Notes (Signed)
Patient transported to CT 

## 2015-06-26 NOTE — Clinical Social Work Note (Signed)
Clinical Social Work Assessment  Patient Details  Name: [name] MRN: [id number] Date of Birth: 1920/10/02  Date of referral:  06/26/15               Reason for consult:   (Patient is from [facility])                Permission sought to share information with:  Case Manager (None.) Permission granted to share information::  No  Name::        Agency::     Relationship::     Contact Information:     Housing/Transportation Living arrangements for the past 2 months:  [name] (Patient was not effectively communicative. CSW was unable to get information.) Source of Information:  Patient Patient Interpreter Needed:  None Criminal Activity/Legal Involvement Pertinent to Current Situation/Hospitalization:  No - Comment as needed Significant Relationships:   (Patient was not effectively communicative. CSW was unable to get information.) Lives with:  Facility Resident Do you feel safe going back to the place where you live?   (Patient was not effectively communicative. CSW was unable to get information. However, patient is from [name].) Need for family participation in patient care:  Yes (Comment)  Care giving concerns:  There are no care giving concerns at this time. Patient lives at [name].   Social Worker assessment / plan:  CSW met with patient at bedside. Patient has a history of dementia and was not effectively communicative. There was no family present. Per note, patient presents to [name] due to altered mental status.  Patient informed CSW that her back is aching. Patient was no effectively communicative during assessment. Patient was not able to verify her living situation, speak about her support system, or completing ADL's.  Patient appears to be confused. Patient states that she still works.  Employment status:   Materials engineer.) Insurance information:   [employer].) PT Recommendations:  Not assessed at this time Information / Referral to  community resources:     Patient/Family's Response to care:  CSW informed patient that she will be admitted. Patient is appropriate at this time.   Patient/Family's Understanding of and Emotional Response to Diagnosis, Current Treatment, and Prognosis:  Patient has no questions for CSW.  Emotional Assessment Appearance:  Appears stated age Attitude/Demeanor/Rapport:  Unable to Assess (Patient is demented.) Affect (typically observed):  Appropriate (Patient is demented.) Orientation:  Oriented to Self Alcohol / Substance use:  Not Applicable Psych involvement (Current and /or in the community):  No (Comment)  Discharge Needs  Concerns to be addressed:  Adjustment to Illness Readmission within the last 30 days:  No Current discharge risk:  None Barriers to Discharge:  No Barriers Identified   Deborah Buffy, LCSW 06/26/2015, 8:26 PM

## 2015-06-26 NOTE — ED Notes (Signed)
Pt son [name], [phone] or [phone] called today to check on Pt.  Pt gave permission for Rn to speak with him and update him.

## 2015-06-26 NOTE — ED Notes (Signed)
Pt from [facility] in the memory care unit.  Pt has Hx of Dementia but staff reports new onset of generalized weakness and AMS.

## 2015-06-26 NOTE — ED Provider Notes (Signed)
CSN: [id number]     Arrival date & time 06/26/15  1151 History   First MD Initiated Contact with Patient 06/26/15 1203     Chief Complaint  Patient presents with  . Altered Mental Status     (Consider location/radiation/quality/duration/timing/severity/associated sxs/prior Treatment) HPI.....Marland Kitchen level V caveat for altered mental status. Patient has dementia and multiple other health problems and lives at [facility].  She was brought to the emergency department for "altered mental status". There is no specific documentation about what has changed with this patient. No family members available to discuss situation.  Patient does not complain of any specific pain  Past Medical History  Diagnosis Date  . Arthritis   . Chicken pox   . Chronic kidney disease   . Colon polyps   . Hypertension   . Dementia   . Angina   . Dysrhythmia   . Heart murmur   . Shortness of breath   . Recurrent upper respiratory infection (URI)   . Pneumonia   . Hypothyroidism   . Anemia   . Headache(784.0)   . Anxiety   . Depression   . Valvular heart disease 09/04/2012  . Left leg pain 10/02/2012  . UTI (urinary tract infection) 12/30/2012  . Acute bronchitis 04/10/2013  . Dehydration 08/31/2013  . Arthritis 10/02/2012  . Asthma    Past Surgical History  Procedure Laterality Date  . Appendectomy    . Cholecystectomy    . Kidney stones    . Tonsillectomy    . Abdominal hysterectomy    . Hand surgery    . Feet surgery    . Shoulder arthroscopy    . Back surgery    . Tonsillectomy    . Eye surgery      cataract removed and eye lids lifted  . Fracture surgery      bilateral arms  . Tubal ligation    . Colonoscopy w/ polypectomy     Family History  Problem Relation Age of Onset  . Heart disease Mother   . Heart disease Father   . Heart disease Other   . Birth defects Other    Social History  Substance Use Topics  . Smoking status: Former Smoker -- 0.20 packs/day for 1 years    Types:  Cigarettes    Quit date: 06/02/1941  . Smokeless tobacco: Never Used  . Alcohol Use: No   OB History    No data available     Review of Systems  Reason unable to perform ROS: Altered mental status.      Allergies  Morphine and related; Codeine; and Hydrocodone  Home Medications   Prior to Admission medications   Medication Sig Start Date End Date Taking? Authorizing Provider  acetaminophen (TYLENOL) 500 MG tablet Take 500 mg by mouth every 4 (four) hours as needed for mild pain, fever or headache.   Yes Historical Provider, MD  albuterol (PROVENTIL HFA;VENTOLIN HFA) 108 (90 BASE) MCG/ACT inhaler Inhale 2 puffs into the lungs every 6 (six) hours as needed for wheezing or shortness of breath. 09/23/13  Yes Sandford Craze, NP  alum & mag hydroxide-simeth (MAALOX/MYLANTA) 200-200-20 MG/5ML suspension Take 30 mLs by mouth every 6 (six) hours as needed for indigestion or heartburn.   Yes Historical Provider, MD  aspirin 81 MG chewable tablet Chew 81 mg by mouth daily.   Yes Historical Provider, MD  clonazePAM (KLONOPIN) 0.5 MG tablet Take 0.5 mg by mouth 3 (three) times daily.    Yes  Historical Provider, MD  clonazePAM (KLONOPIN) 0.5 MG tablet Take 0.5 mg by mouth every 4 (four) hours as needed for anxiety.   Yes Historical Provider, MD  guaiFENesin (Q-TUSSIN) 100 MG/5ML liquid Take 200 mg by mouth every 6 (six) hours as needed for cough.   Yes Historical Provider, MD  levothyroxine (SYNTHROID, LEVOTHROID) 50 MCG tablet TAKE 1 TABLET (50 MCG TOTAL) BY MOUTH DAILY. Patient taking differently: Take 1 tablet (50 mcg total) by mouth daily. 09/25/14  Yes Myrlene Broker, MD  loperamide (IMODIUM) 2 MG capsule Take 2 mg by mouth every 3 (three) hours as needed for diarrhea or loose stools.    Yes Historical Provider, MD  magnesium hydroxide (MILK OF MAGNESIA) 400 MG/5ML suspension Take 30 mLs by mouth at bedtime as needed for mild constipation.   Yes Historical Provider, MD   neomycin-bacitracin-polymyxin (NEOSPORIN) ointment Apply 1 application topically daily as needed for wound care.    Yes Historical Provider, MD  risperiDONE (RISPERDAL) 1 MG tablet Take 1 mg by mouth at bedtime.   Yes Historical Provider, MD  rivastigmine (EXELON) 3 MG capsule Take 3 mg by mouth 2 (two) times daily.   Yes Historical Provider, MD  senna-docusate (SENOKOT-S) 8.6-50 MG tablet Take 1 tablet by mouth 2 (two) times daily.   Yes Historical Provider, MD  traZODone (DESYREL) 50 MG tablet Take 50 mg by mouth at bedtime as needed for sleep.   Yes Historical Provider, MD   BP 162/66 mmHg  Pulse 65  Temp(Src) 98.4 F (36.9 C) (Axillary)  Resp 17  SpO2 97% Physical Exam  Constitutional:  Slow to respond, but does answer questions.  Demented.  HENT:  Head: Normocephalic and atraumatic.  Eyes: Conjunctivae and EOM are normal. Pupils are equal, round, and reactive to light.  Neck: Normal range of motion. Neck supple.  Cardiovascular: Normal rate and regular rhythm.   Pulmonary/Chest: Effort normal and breath sounds normal.  Abdominal: Soft. Bowel sounds are normal.  Musculoskeletal: Normal range of motion.  Neurological:  No gross neuro def  Skin: Skin is warm and dry.  Psychiatric:  Flat affect  Nursing note and vitals reviewed.   ED Course  Procedures (including critical care time) Labs Review Labs Reviewed  CBC WITH DIFFERENTIAL/PLATELET - Abnormal; Notable for the following:    RBC 3.79 (*)    Hemoglobin 10.8 (*)    HCT 32.4 (*)    All other components within normal limits  COMPREHENSIVE METABOLIC PANEL - Abnormal; Notable for the following:    Glucose, Bld 102 (*)    Total Protein 6.3 (*)    Albumin 3.3 (*)    AST 14 (*)    GFR calc non Af Amer 59 (*)    All other components within normal limits  TROPONIN I  URINALYSIS, ROUTINE W REFLEX MICROSCOPIC (NOT AT [facility])    Imaging Review Ct Head Wo Contrast  06/26/2015  CLINICAL DATA:  New onset of generalized  weakness and altered mental status. History of dementia. EXAM: CT HEAD WITHOUT CONTRAST TECHNIQUE: Contiguous axial images were obtained from the base of the skull through the vertex without intravenous contrast. COMPARISON:  CTs 06/08/2015 and 05/16/2015. FINDINGS: There is no evidence of acute intracranial hemorrhage, mass lesion, brain edema or extra-axial fluid collection. The ventricles and subarachnoid spaces are diffusely prominent but stated. There is no CT evidence of acute cortical infarction. There is stable relatively mild periventricular white matter disease. The visualized paranasal sinuses, mastoid air cells and middle ears are  clear. The calvarium is intact. IMPRESSION: Stable cerebral and cerebellar atrophy with mild periventricular white matter disease. No acute intracranial findings. Electronically Signed   By: Carey Bullocks M.D.   On: 06/26/2015 13:54   Dg Chest Port 1 View  06/26/2015  CLINICAL DATA:  82 year old nursing home patient patient with Alzheimer's dementia presenting with acute onset of generalized weakness and acute mental status changes. EXAM: PORTABLE CHEST 1 VIEW COMPARISON:  06/08/2015 and earlier. FINDINGS: Suboptimal inspiration accounts for crowded bronchovascular markings, especially in the bases, and accentuates the cardiac silhouette. Taking this into account, cardiac silhouette mildly enlarged, unchanged. Lungs clear. Bronchovascular markings normal. Pulmonary vascularity normal. No visible pleural effusions. No pneumothorax. Degenerative changes again noted in the shoulder joints, right greater than left. IMPRESSION: Suboptimal inspiration. Stable mild cardiomegaly. No acute cardiopulmonary disease. Electronically Signed   By: Hulan Saas M.D.   On: 06/26/2015 14:46   I have personally reviewed and evaluated these images and lab results as part of my medical decision-making.   EKG Interpretation   Date/Time:  Tuesday June 26 2015 12:51:50  EST Ventricular Rate:  56 PR Interval:  311 QRS Duration: 155 QT Interval:  458 QTC Calculation: 442 R Axis:   -66 Text Interpretation:  Sinus rhythm Prolonged PR interval Right bundle  branch block Inferior infarct, old Confirmed by Brooklee Michelin  MD, Shalin Vonbargen ([id number]) on  06/26/2015 1:35:36 PM      MDM   Final diagnoses:  Altered mental status, unspecified altered mental status type    Uncertain etiology of patient's altered mental status.  Urinalysis pending. CT head and chest x-ray showed no acute findings. Admit to observation.    Donnetta Hutching, MD 06/26/15 [phone]

## 2015-06-26 NOTE — H&P (Addendum)
[facility] History and Physical  [name] VWU:[id number] DOB: Oct 24, 1920 DOA: 06/26/2015  Referring physician: ED physician, Dr. Adriana Simas  PCP: doctor at the facility, pt unable to provide   Chief Complaint: confusion  HPI:  82 year old female with a history of dementia, hypertension, hypothyroidism, frequent falls presented to the emergency department when she was found to be lethargic at the living facility [facility]. Please note that pt is unable to provide any information due to confusion and dementia, per ED doctor, no specific information provided upon pt's arrival except change in mental status, no reported chest pain or shortness of breath, no fevers or chills, no reported abd concerns.   In ED, pt was alert but confused, VSS, blood work fairly unremarkable. NO clear source of AMS elicited in ED and it was determined pt would benefit from an admission for further evaluation.   Assessment and Plan: Active Problems: Acute encephalopathy - unclear etiology - CXR with no acute cardiopulmonary findings, UA is pending - CT head with chronic changes - admit to medical unit and observe  - will hold scheduled Clonazepam that pt takes at the facility, allow antianxiety meds only if needed and if pt alert enough to take it - PT/OT evaluation once pt more medically stable  - provide IVF for 12 hours - CBC and BMP in AM  Hypothyroidism  - continue synthroid   Dementia - per record review, at baseline   Constipation vs Diarrhea - pt has multiple meds stool softeners and imodium on her list - will stop all of this and determine the need for these meds if any    Radiological Exams on Admission: Ct Head Wo Contrast 06/26/2015 Stable cerebral and cerebellar atrophy with mild periventricular white matter disease. No acute intracranial findings.   Dg Chest Port 1 View 06/26/2015 Suboptimal inspiration. Stable mild cardiomegaly. No acute cardiopulmonary disease.   Code  Status: Full Family Communication: Pt at bedside Disposition Plan: Admit for further evaluation    Danie Binder [facility] [phone]   Review of Systems:  - unable to provide due to AMS    Past Medical History  Diagnosis Date  . Arthritis   . Chicken pox   . Chronic kidney disease   . Colon polyps   . Hypertension   . Dementia   . Angina   . Dysrhythmia   . Heart murmur   . Shortness of breath   . Recurrent upper respiratory infection (URI)   . Pneumonia   . Hypothyroidism   . Anemia   . Headache(784.0)   . Anxiety   . Depression   . Valvular heart disease 09/04/2012  . Left leg pain 10/02/2012  . UTI (urinary tract infection) 12/30/2012  . Acute bronchitis 04/10/2013  . Dehydration 08/31/2013  . Arthritis 10/02/2012  . Asthma     Past Surgical History  Procedure Laterality Date  . Appendectomy    . Cholecystectomy    . Kidney stones    . Tonsillectomy    . Abdominal hysterectomy    . Hand surgery    . Feet surgery    . Shoulder arthroscopy    . Back surgery    . Tonsillectomy    . Eye surgery      cataract removed and eye lids lifted  . Fracture surgery      bilateral arms  . Tubal ligation    . Colonoscopy w/ polypectomy      Social History:  reports that she quit smoking  about 74 years ago. Her smoking use included Cigarettes. She has a .2 pack-year smoking history. She has never used smokeless tobacco. She reports that she does not drink alcohol or use illicit drugs.  Allergies  Allergen Reactions  . Morphine And Related     Not noted on MAR  . Codeine Rash  . Hydrocodone Rash    Family History  Problem Relation Age of Onset  . Heart disease Mother   . Heart disease Father   . Heart disease Other   . Birth defects Other     Prior to Admission medications   Medication Sig Start Date End Date Taking? Authorizing Provider  acetaminophen (TYLENOL) 500 MG tablet Take 500 mg by mouth every 4 (four) hours as needed for mild pain, fever or headache.   Yes  Historical Provider, MD  albuterol (PROVENTIL HFA;VENTOLIN HFA) 108 (90 BASE) MCG/ACT inhaler Inhale 2 puffs into the lungs every 6 (six) hours as needed for wheezing or shortness of breath. 09/23/13  Yes Sandford Craze, NP  alum & mag hydroxide-simeth (MAALOX/MYLANTA) 200-200-20 MG/5ML suspension Take 30 mLs by mouth every 6 (six) hours as needed for indigestion or heartburn.   Yes Historical Provider, MD  aspirin 81 MG chewable tablet Chew 81 mg by mouth daily.   Yes Historical Provider, MD  clonazePAM (KLONOPIN) 0.5 MG tablet Take 0.5 mg by mouth 3 (three) times daily.    Yes Historical Provider, MD  clonazePAM (KLONOPIN) 0.5 MG tablet Take 0.5 mg by mouth every 4 (four) hours as needed for anxiety.   Yes Historical Provider, MD  guaiFENesin (Q-TUSSIN) 100 MG/5ML liquid Take 200 mg by mouth every 6 (six) hours as needed for cough.   Yes Historical Provider, MD  levothyroxine (SYNTHROID, LEVOTHROID) 50 MCG tablet TAKE 1 TABLET (50 MCG TOTAL) BY MOUTH DAILY. Patient taking differently: Take 1 tablet (50 mcg total) by mouth daily. 09/25/14  Yes Myrlene Broker, MD  loperamide (IMODIUM) 2 MG capsule Take 2 mg by mouth every 3 (three) hours as needed for diarrhea or loose stools.    Yes Historical Provider, MD  magnesium hydroxide (MILK OF MAGNESIA) 400 MG/5ML suspension Take 30 mLs by mouth at bedtime as needed for mild constipation.   Yes Historical Provider, MD  neomycin-bacitracin-polymyxin (NEOSPORIN) ointment Apply 1 application topically daily as needed for wound care.    Yes Historical Provider, MD  risperiDONE (RISPERDAL) 1 MG tablet Take 1 mg by mouth at bedtime.   Yes Historical Provider, MD  rivastigmine (EXELON) 3 MG capsule Take 3 mg by mouth 2 (two) times daily.   Yes Historical Provider, MD  senna-docusate (SENOKOT-S) 8.6-50 MG tablet Take 1 tablet by mouth 2 (two) times daily.   Yes Historical Provider, MD  traZODone (DESYREL) 50 MG tablet Take 50 mg by mouth at bedtime as needed  for sleep.   Yes Historical Provider, MD    Physical Exam: Filed Vitals:   06/26/15 0000 06/26/15 1456  BP: 132/66 162/66  Pulse: 56 65  Temp: 98.4 F (36.9 C)   TempSrc: Axillary   Resp: 11 17  SpO2: 96% 97%    Physical Exam  Constitutional: Appears calm, NAD HENT: Normocephalic. External right and left ear normal.  Eyes: Conjunctivae and EOM are normal. PERRLA, no scleral icterus.  Neck: Normal ROM. Neck supple. No JVD. No tracheal deviation. No thyromegaly.  CVS: RRR, no gallops, no carotid bruit.  Pulmonary: Effort and breath sounds normal, diminished breath sounds at bases  Abdominal: Soft. BS +,  no distension, tenderness, rebound or guarding.  Musculoskeletal: Normal range of motion.   Lymphadenopathy: No lymphadenopathy noted, cervical, inguinal. Neuro: Alert. Moving all 4 extremities spont Skin: Skin is warm and dry. No rash noted. Not diaphoretic. No erythema. No pallor.  Psychiatric: No agitation noted, pt is calm  Labs on Admission:  Basic Metabolic Panel:  Recent Labs Lab 06/26/15 1325  NA 145  K 4.0  CL 108  CO2 29  GLUCOSE 102*  BUN 16  CREATININE 0.83  CALCIUM 9.1   Liver Function Tests:  Recent Labs Lab 06/26/15 1325  AST 14*  ALT 14  ALKPHOS 73  BILITOT 0.5  PROT 6.3*  ALBUMIN 3.3*   CBC:  Recent Labs Lab 06/26/15 1325  WBC 8.6  NEUTROABS 6.0  HGB 10.8*  HCT 32.4*  MCV 85.5  PLT 228   Cardiac Enzymes:  Recent Labs Lab 06/26/15 1325  TROPONINI <0.03   EKG: pending   If 7PM-7AM, please contact night-coverage [email or web address] Password [facility] 06/26/2015, 3:49 PM

## 2015-06-26 NOTE — ED Notes (Signed)
Bed: [facility]ALA Expected date:  Expected time:  Means of arrival:  Comments: [facility] 

## 2015-06-27 LAB — CBC
HCT: 33.2 % — ABNORMAL LOW (ref 36.0–46.0)
Hemoglobin: 11 g/dL — ABNORMAL LOW (ref 12.0–15.0)
MCH: 28.9 pg (ref 26.0–34.0)
MCHC: 33.1 g/dL (ref 30.0–36.0)
MCV: 87.1 fL (ref 78.0–100.0)
PLATELETS: 241 10*3/uL (ref 150–400)
RBC: 3.81 MIL/uL — AB (ref 3.87–5.11)
RDW: 13 % (ref 11.5–15.5)
WBC: 10.1 10*3/uL (ref 4.0–10.5)

## 2015-06-27 LAB — BASIC METABOLIC PANEL
Anion gap: 8 (ref 5–15)
BUN: 16 mg/dL (ref 6–20)
CALCIUM: 9 mg/dL (ref 8.9–10.3)
CHLORIDE: 110 mmol/L (ref 101–111)
CO2: 26 mmol/L (ref 22–32)
CREATININE: 0.76 mg/dL (ref 0.44–1.00)
Glucose, Bld: 124 mg/dL — ABNORMAL HIGH (ref 65–99)
Potassium: 4.1 mmol/L (ref 3.5–5.1)
SODIUM: 144 mmol/L (ref 135–145)

## 2015-06-27 LAB — TSH: TSH: 3.27 u[IU]/mL (ref 0.350–4.500)

## 2015-06-27 MED ORDER — HALOPERIDOL LACTATE 5 MG/ML IJ SOLN
1.0000 mg | Freq: Once | INTRAMUSCULAR | Status: AC
  Administered 2015-06-27: 1 mg via INTRAVENOUS
  Filled 2015-06-27: qty 1

## 2015-06-27 NOTE — ED Provider Notes (Signed)
CSN: [id number]     Arrival date & time 05/16/15  0015 History   None    No chief complaint on file.    (Consider location/radiation/quality/duration/timing/severity/associated sxs/prior Treatment) HPI   82 year old female presenting from [facility] for evaluation of her fall. Reportedly witnessed by staff. Fell onto her right side and struck her head. No reported loss of consciousness. Patient reportedly was complaining of a headache although she currently denies this to me. I question the reliability of her history though. She does have a past history dementia. Per report, she was at her baseline mental status immediately after the fall.    Past Medical History  Diagnosis Date  . Arthritis   . Chicken pox   . Chronic kidney disease   . Colon polyps   . Hypertension   . Dementia   . Angina   . Dysrhythmia   . Heart murmur   . Shortness of breath   . Recurrent upper respiratory infection (URI)   . Pneumonia   . Hypothyroidism   . Anemia   . Headache(784.0)   . Anxiety   . Depression   . Valvular heart disease 09/04/2012  . Left leg pain 10/02/2012  . UTI (urinary tract infection) 12/30/2012  . Acute bronchitis 04/10/2013  . Dehydration 08/31/2013  . Arthritis 10/02/2012  . Asthma    Past Surgical History  Procedure Laterality Date  . Appendectomy    . Cholecystectomy    . Kidney stones    . Tonsillectomy    . Abdominal hysterectomy    . Hand surgery    . Feet surgery    . Shoulder arthroscopy    . Back surgery    . Tonsillectomy    . Eye surgery      cataract removed and eye lids lifted  . Fracture surgery      bilateral arms  . Tubal ligation    . Colonoscopy w/ polypectomy     Family History  Problem Relation Age of Onset  . Heart disease Mother   . Heart disease Father   . Heart disease Other   . Birth defects Other    Social History  Substance Use Topics  . Smoking status: Former Smoker -- 0.20 packs/day for 1 years    Types: Cigarettes    Quit  date: 06/02/1941  . Smokeless tobacco: Never Used  . Alcohol Use: No   OB History    No data available     Review of Systems  5 caveat because of dementia   Allergies  Morphine and related; Codeine; and Hydrocodone  Home Medications   Prior to Admission medications   Medication Sig Start Date End Date Taking? Authorizing Provider  acetaminophen (TYLENOL) 500 MG tablet Take 500 mg by mouth every 4 (four) hours as needed for mild pain, fever or headache.   Yes Historical Provider, MD  albuterol (PROVENTIL HFA;VENTOLIN HFA) 108 (90 BASE) MCG/ACT inhaler Inhale 2 puffs into the lungs every 6 (six) hours as needed for wheezing or shortness of breath. 09/23/13  Yes Sandford Craze, NP  alum & mag hydroxide-simeth (MAALOX/MYLANTA) 200-200-20 MG/5ML suspension Take 30 mLs by mouth every 6 (six) hours as needed for indigestion or heartburn.   Yes Historical Provider, MD  aspirin 81 MG chewable tablet Chew 81 mg by mouth daily.   Yes Historical Provider, MD  clonazePAM (KLONOPIN) 0.5 MG tablet Take 0.5 mg by mouth 3 (three) times daily.    Yes Historical Provider, MD  clonazePAM Scarlette Calico)  0.5 MG tablet Take 0.5 mg by mouth every 4 (four) hours as needed for anxiety.   Yes Historical Provider, MD  guaiFENesin (Q-TUSSIN) 100 MG/5ML liquid Take 200 mg by mouth every 6 (six) hours as needed for cough.   Yes Historical Provider, MD  levothyroxine (SYNTHROID, LEVOTHROID) 50 MCG tablet TAKE 1 TABLET (50 MCG TOTAL) BY MOUTH DAILY. Patient taking differently: Take 1 tablet (50 mcg total) by mouth daily. 09/25/14  Yes Myrlene Broker, MD  loperamide (IMODIUM) 2 MG capsule Take 2 mg by mouth every 3 (three) hours as needed for diarrhea or loose stools.    Yes Historical Provider, MD  magnesium hydroxide (MILK OF MAGNESIA) 400 MG/5ML suspension Take 30 mLs by mouth at bedtime as needed for mild constipation.   Yes Historical Provider, MD  neomycin-bacitracin-polymyxin (NEOSPORIN) ointment Apply 1  application topically daily as needed for wound care.    Yes Historical Provider, MD  rivastigmine (EXELON) 3 MG capsule Take 3 mg by mouth 2 (two) times daily.   Yes Historical Provider, MD  senna-docusate (SENOKOT-S) 8.6-50 MG tablet Take 1 tablet by mouth 2 (two) times daily.   Yes Historical Provider, MD  traZODone (DESYREL) 50 MG tablet Take 50 mg by mouth at bedtime as needed for sleep.   Yes Historical Provider, MD  risperiDONE (RISPERDAL) 1 MG tablet Take 1 mg by mouth at bedtime.    Historical Provider, MD   BP 164/87 mmHg  Pulse 78  Resp 18  SpO2 94% Physical Exam  Constitutional: She appears well-developed and well-nourished. No distress.  HENT:  Head: Normocephalic.  Small hematoma to right frontal/temporal region. This does not seem sniffily tender though. No midline spinal tenderness.  Eyes: Conjunctivae are normal. Right eye exhibits no discharge. Left eye exhibits no discharge.  Neck: Neck supple.  Cardiovascular: Normal rate, regular rhythm and normal heart sounds.  Exam reveals no gallop and no friction rub.   No murmur heard. Pulmonary/Chest: Effort normal and breath sounds normal. No respiratory distress.  Abdominal: Soft. She exhibits no distension. There is no tenderness.  Musculoskeletal: She exhibits no edema or tenderness.  Neurological: She is alert.  Pleasantly demented. Follow some simple commands. No focal motor deficit. Initial extremities equally.  Skin: Skin is warm and dry.  Psychiatric: She has a normal mood and affect. Her behavior is normal. Thought content normal.  Nursing note and vitals reviewed.   ED Course  Procedures (including critical care time) Labs Review Labs Reviewed - No data to display  Imaging Review Ct Head Wo Contrast  06/26/2015  CLINICAL DATA:  New onset of generalized weakness and altered mental status. History of dementia. EXAM: CT HEAD WITHOUT CONTRAST TECHNIQUE: Contiguous axial images were obtained from the base of the  skull through the vertex without intravenous contrast. COMPARISON:  CTs 06/08/2015 and 05/16/2015. FINDINGS: There is no evidence of acute intracranial hemorrhage, mass lesion, brain edema or extra-axial fluid collection. The ventricles and subarachnoid spaces are diffusely prominent but stated. There is no CT evidence of acute cortical infarction. There is stable relatively mild periventricular white matter disease. The visualized paranasal sinuses, mastoid air cells and middle ears are clear. The calvarium is intact. IMPRESSION: Stable cerebral and cerebellar atrophy with mild periventricular white matter disease. No acute intracranial findings. Electronically Signed   By: Carey Bullocks M.D.   On: 06/26/2015 13:54   Dg Chest Port 1 View  06/26/2015  CLINICAL DATA:  82 year old nursing home patient patient with Alzheimer's dementia presenting with  acute onset of generalized weakness and acute mental status changes. EXAM: PORTABLE CHEST 1 VIEW COMPARISON:  06/08/2015 and earlier. FINDINGS: Suboptimal inspiration accounts for crowded bronchovascular markings, especially in the bases, and accentuates the cardiac silhouette. Taking this into account, cardiac silhouette mildly enlarged, unchanged. Lungs clear. Bronchovascular markings normal. Pulmonary vascularity normal. No visible pleural effusions. No pneumothorax. Degenerative changes again noted in the shoulder joints, right greater than left. IMPRESSION: Suboptimal inspiration. Stable mild cardiomegaly. No acute cardiopulmonary disease. Electronically Signed   By: Hulan Saas M.D.   On: 06/26/2015 14:46   I have personally reviewed and evaluated these images and lab results as part of my medical decision-making.   EKG Interpretation None      MDM   Final diagnoses:  Fall, initial encounter  Contusion    82 year old female presenting after fall. Reportedly mechanical in nature. She said her reported baseline mental status. Imaging without  acute abnormality. She is afebrile and hemodynamically stable. I feel she is appropriate for discharge at this time.    Raeford Razor, MD 06/27/15 1229

## 2015-06-27 NOTE — NC FL2 (Signed)
[place] MEDICAID FL2 LEVEL OF CARE SCREENING TOOL     IDENTIFICATION  Patient Name: [name] Birthdate: 01-09-1921 Sex: female Admission Date (Current Location): 06/26/2015  [facility] and [place] Number:  Producer, television/film/video and Address:  [facility],  [address] [place] [address]      Provider Number: [id number]  Attending Physician Name and Address:  Dorothea Ogle, MD  Relative Name and Phone Number:       Current Level of Care: Hospital Recommended Level of Care: Memory Care, Assisted Living Facility Prior Approval Number:    Date Approved/Denied:   PASRR Number: [id number]  Discharge Plan: Other (Comment) (ALF, memory care)    Current Diagnoses: Patient Active Problem List   Diagnosis Date Noted  . Confusion 06/26/2015  . Fall   . Aspiration pneumonia (HCC) 01/10/2015  . Acute encephalopathy 01/08/2015  . UTI (urinary tract infection) 01/08/2015  . 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 Recurrent falls 11/27/2014  . FTT (failure to thrive) in adult 10/26/2014  . Dehydration 10/26/2014  . Abnormal urinalysis 10/26/2014  . Normocytic anemia 10/26/2014  . Lump of skin of left upper extremity 09/07/2014  . Rash and nonspecific skin eruption 05/08/2014  . Syncope and collapse 11/30/2013  . Low back pain, episodic 11/20/2013  . Chronic kidney disease   . Insomnia 08/31/2013  . Arthritis 10/02/2012  . Valvular heart disease 09/04/2012  . Restless leg syndrome 06/06/2012  . Trochanteric bursitis 08/02/2011  . Dementia 08/02/2011  . Leg pain 03/28/2011  . Hypertension 09/22/2010  . Hypothyroidism 09/22/2010    Orientation RESPIRATION BLADDER Height & Weight    Self, Situation  Normal (Room air) Incontinent   159 lbs.  BEHAVIORAL SYMPTOMS/MOOD NEUROLOGICAL BOWEL NUTRITION STATUS      Continent Diet (Soft)  AMBULATORY STATUS COMMUNICATION OF NEEDS Skin     Verbally Normal                        Personal Care Assistance Level of Assistance              Functional Limitations Info             SPECIAL CARE FACTORS FREQUENCY                       Contractures Contractures Info: Not present    Additional Factors Info  Code Status, Allergies Code Status Info: FULL Allergies Info: Morphine and related, Codeine, Hydrocodone           Current Medications (06/27/2015):  This is the current hospital active medication list Current Facility-Administered Medications  Medication Dose Route Frequency Provider Last Rate Last Dose  . 0.9 %  sodium chloride infusion   Intravenous Continuous Dorothea Ogle, MD 75 mL/hr at 06/26/15 2013    . acetaminophen (TYLENOL) tablet 500 mg  500 mg Oral Q4H PRN Dorothea Ogle, MD   500 mg at 06/27/15 0329  . albuterol (PROVENTIL) (2.5 MG/3ML) 0.083% nebulizer solution 2.5 mg  2.5 mg Nebulization Q6H PRN Dorothea Ogle, MD      . aspirin chewable tablet 81 mg  81 mg Oral Daily Dorothea Ogle, MD   81 mg at 06/27/15 0944  . clonazePAM (KLONOPIN) tablet 0.5 mg  0.5 mg Oral TID PRN Dorothea Ogle, MD   0.5 mg at 06/27/15 0750  . enoxaparin (LOVENOX) injection 40 mg  40 mg Subcutaneous Q24H  Dorothea Ogle, MD   40 mg at 06/26/15 2214  . guaiFENesin (ROBITUSSIN) 100 MG/5ML solution 200 mg  10 mL Oral Q6H PRN Dorothea Ogle, MD      . levothyroxine (SYNTHROID, LEVOTHROID) tablet 50 mcg  50 mcg Oral QAC breakfast Dorothea Ogle, MD   50 mcg at 06/27/15 0750  . magnesium hydroxide (MILK OF MAGNESIA) suspension 30 mL  30 mL Oral QHS PRN Dorothea Ogle, MD      . neomycin-bacitracin-polymyxin (NEOSPORIN) ointment 1 application  1 application Topical Daily PRN Dorothea Ogle, MD      . ondansetron [facility]) tablet 4 mg  4 mg Oral Q6H PRN Dorothea Ogle, MD       Or  . ondansetron [facility]) injection 4 mg  4 mg Intravenous Q6H PRN Dorothea Ogle, MD      . risperiDONE (RISPERDAL) tablet 1 mg  1 mg Oral QHS Dorothea Ogle, MD   1 mg at 06/26/15 2214  .  rivastigmine (EXELON) capsule 3 mg  3 mg Oral BID Dorothea Ogle, MD   3 mg at 06/27/15 0944  . traZODone (DESYREL) tablet 50 mg  50 mg Oral QHS PRN Dorothea Ogle, MD   50 mg at 06/26/15 2214     Discharge Medications: Please see discharge summary for a list of discharge medications.  Relevant Imaging Results:  Relevant Lab Results:   Additional Information SSN: [id number]  Adrian Blackwater, LCSW

## 2015-06-27 NOTE — Progress Notes (Signed)
Patient ID: [name], female   DOB: 1921/05/20, 82 y.o.   MRN: [id number] [facility] PROGRESS NOTE  TURQUOISE ESCH BJY:[id number] DOB: 11-02-1920 DOA: 06/26/2015 PCP: No primary care provider on file.   Brief narrative:    82 year old female with a history of dementia, hypertension, hypothyroidism, frequent falls presented to the emergency department when she was found to be lethargic at the living facility [facility]. Please note that pt is unable to provide any information due to confusion and dementia, per ED doctor, no specific information provided upon pt's arrival except change in mental status, no reported chest pain or shortness of breath, no fevers or chills, no reported abd concerns.   In ED, pt was alert but confused, VSS, blood work fairly unremarkable. NO clear source of AMS elicited in ED and it was determined pt would benefit from an admission for further evaluation.   Assessment/Plan:    Active Problems: Acute encephalopathy - unclear etiology, still rather confused - CXR with no acute cardiopulmonary findings, UA unremarkable  - CT head with chronic changes - continue to hold scheduled Clonazepam that pt takes at the facility, allow antianxiety meds only if needed and if pt alert enough to take it - PT/OT evaluation once pt more medically stable  - CBC and BMP in AM  Hypothyroidism  - continue synthroid   Dementia - per record review, at baseline   Constipation vs Diarrhea - pt has multiple meds stool softeners and imodium on her list - will stop all of this and determine the need for these meds if any   DVT prophylaxis - Lovenox SQ  Code Status: Full.  Family Communication:  plan of care discussed with the patient Disposition Plan: SNF in AM   IV access:  Peripheral IV  Procedures and diagnostic studies:    Ct Head Wo Contrast 06/26/2015 Stable cerebral and cerebellar atrophy with mild periventricular white matter disease. No acute  intracranial findings.  Dg Chest Port 1 View 06/26/2015 Suboptimal inspiration. Stable mild cardiomegaly. No acute cardiopulmonary disease.   Medical Consultants:  None   Other Consultants:  PT/OT  IAnti-Infectives:   None   [name], MD  [facility] Pager [phone]   If 7PM-7AM, please contact night-coverage [email or web address] Password [email or web address] 06/27/2015, 1:38 PM   LOS: 1 day   HPI/Subjective: No events overnight.   Objective: Filed Vitals:   06/26/15 1929 06/26/15 2055 06/27/15 0540 06/27/15 0555  BP: 168/69 155/51 114/37 128/50  Pulse: 70 75 56 71  Temp:  97.6 F (36.4 C) 97.9 F (36.6 C)   TempSrc:  Oral Axillary   Resp: SpO2: 93% 94% 96%     Intake/Output Summary (Last 24 hours) at 06/27/15 1338 Last data filed at 06/27/15 1052  Gross per 24 hour  Intake   1240 ml  Output      0 ml  Net   1240 ml    Exam:   General:  Pt is alert, confused   Cardiovascular: Regular rate and rhythm, no rubs, no gallops  Respiratory: Clear to auscultation bilaterally, no wheezing, no crackles, no rhonchi  Abdomen: Soft, non tender, non distended, bowel sounds present, no guarding  Data Reviewed: Basic Metabolic Panel:  Recent Labs Lab 06/26/15 1325 06/27/15 0530  NA 145 144  K 4.0 4.1  CL 108 110  CO2 29 26  GLUCOSE 102* 124*  BUN 16 16  CREATININE 0.83 0.76  CALCIUM 9.1 9.0   Liver Function Tests:  Recent Labs Lab 06/26/15 1325  AST 14*  ALT 14  ALKPHOS 73  BILITOT 0.5  PROT 6.3*  ALBUMIN 3.3*   CBC:  Recent Labs Lab 06/26/15 1325 06/27/15 0530  WBC 8.6 10.1  NEUTROABS 6.0  --   HGB 10.8* 11.0*  HCT 32.4* 33.2*  MCV 85.5 87.1  PLT 228 241   Cardiac Enzymes:  Recent Labs Lab 06/26/15 1325  TROPONINI <0.03   Scheduled Meds: . aspirin  81 mg Oral Daily  . enoxaparin (LOVENOX) injection  40 mg Subcutaneous Q24H  . levothyroxine  50 mcg Oral QAC breakfast  . risperiDONE  1 mg Oral QHS  . rivastigmine  3 mg Oral BID    Continuous Infusions: . sodium chloride 75 mL/hr at 06/26/15 2013

## 2015-06-27 NOTE — Clinical Social Work Note (Signed)
CSW spoke with Clydie Braun at [facility], memory care unit, ALF to confirm that pt can return once medically stable. CSW will continue to follow for d/c needs. Pt's son, Leonette Most [phone] is also aware.   Etta Quill, LCSW [phone] Hospital psychiatric & 5E, 5W 31-35 Licensed Clinical Social Worker

## 2015-06-27 NOTE — Care Management Note (Signed)
Case Management Note  Patient Details  Name: [name] MRN: [id number] Date of Birth: Dec 01, 1920  Subjective/Objective:      Sudden onset of weakness and ataxia              Action/Plan:Date: June 27, 2015 Chart reviewed for concurrent status and case management needs. Will continue to follow patient for changes and needs: Marcelle Smiling, BSN, CCM, RN,   [phone]   Expected Discharge Date:   (UNKNOWN)               Expected Discharge Plan:  Home/Self Care  In-House Referral:  NA  Discharge planning Services  CM Consult  Post Acute Care Choice:  NA Choice offered to:  NA  DME Arranged:  N/A DME Agency:  NA  HH Arranged:  NA HH Agency:  NA  Status of Service:  Completed, signed off  Medicare Important Message Given:    Date Medicare IM Given:    Medicare IM give by:    Date Additional Medicare IM Given:    Additional Medicare Important Message give by:     If discussed at Long Length of Stay Meetings, dates discussed:    Additional Comments:  Golda Acre, RN 06/27/2015, 9:59 AM

## 2015-06-28 DIAGNOSIS — R4182 Altered mental status, unspecified: Secondary | ICD-10-CM | POA: Insufficient documentation

## 2015-06-28 LAB — URINE CULTURE: Culture: NO GROWTH

## 2015-06-28 MED ORDER — CLONAZEPAM 0.5 MG PO TABS: 0.5000 mg | ORAL_TABLET | ORAL | Status: DC | PRN

## 2015-06-28 NOTE — Progress Notes (Signed)
Haldol  iv at 2220 administered secondary to increased agitation(kicking,punching,attempting to pull out iv) Mod relief noted, However patient did not sleep, and remained confused.

## 2015-06-28 NOTE — Discharge Instructions (Signed)
Confusion Confusion is the inability to think with your usual speed or clarity. Confusion may come on quickly or slowly over time. How quickly the confusion comes on depends on the cause. Confusion can be due to any number of causes. CAUSES   Concussion, head injury, or head trauma.  Seizures.  Stroke.  Fever.  Brain tumor.  Age related decreased brain function (dementia).  Heightened emotional states like rage or terror.  Mental illness in which the person loses the ability to determine what is real and what is not (hallucinations).  Infections such as a urinary tract infection (UTI).  Toxic effects from alcohol, drugs, or prescription medicines.  Dehydration and an imbalance of salts in the body (electrolytes).  Lack of sleep.  Low blood sugar (diabetes).  Low levels of oxygen from conditions such as chronic lung disorders.  Drug interactions or other medicine side effects.  Nutritional deficiencies, especially niacin, thiamine, vitamin C, or vitamin B.  Sudden drop in body temperature (hypothermia).  Change in routine, such as when traveling or hospitalized. SIGNS AND SYMPTOMS  People often describe their thinking as cloudy or unclear when they are confused. Confusion can also include feeling disoriented. That means you are unaware of where or who you are. You may also not know what the date or time is. If confused, you may also have difficulty paying attention, remembering, and making decisions. Some people also act aggressively when they are confused.  DIAGNOSIS  The medical evaluation of confusion may include:  Blood and urine tests.  X-rays.  Brain and nervous system tests.  Analyzing your brain waves (electroencephalogram or EEG).  Magnetic resonance imaging (MRI) of your head.  Computed tomography (CT) scan of your head.  Mental status tests in which your health care provider may ask many questions. Some of these questions may seem silly or strange,  but they are a very important test to help diagnose and treat confusion. TREATMENT  An admission to the hospital may not be needed, but a person with confusion should not be left alone. Stay with a family member or friend until the confusion clears. Avoid alcohol, pain relievers, or sedative drugs until you have fully recovered. Do not drive until directed by your health care provider. HOME CARE INSTRUCTIONS  What family and friends can do:  To find out if someone is confused, ask the person to state his or her name, age, and the date. If the person is unsure or answers incorrectly, he or she is confused.  Always introduce yourself, no matter how well the person knows you.  Often remind the person of his or her location.  Place a calendar and clock near the confused person.  Help the person with his or her medicines. You may want to use a pill box, an alarm as a reminder, or give the person each dose as prescribed.  Talk about current events and plans for the day.  Try to keep the environment calm, quiet, and peaceful.  Make sure the person keeps follow-up visits with his or her health care provider. PREVENTION  Ways to prevent confusion:  Avoid alcohol.  Eat a balanced diet.  Get enough sleep.  Take medicine only as directed by your health care provider.  Do not become isolated. Spend time with other people and make plans for your days.  Keep careful watch on your blood sugar levels if you are diabetic. SEEK IMMEDIATE MEDICAL CARE IF:   You develop severe headaches, repeated vomiting, seizures, blackouts, or   slurred speech.  There is increasing confusion, weakness, numbness, restlessness, or personality changes.  You develop a loss of balance, have marked dizziness, feel uncoordinated, or fall.  You have delusions, hallucinations, or develop severe anxiety.  Your family members think you need to be rechecked.   This information is not intended to replace advice given  to you by your health care provider. Make sure you discuss any questions you have with your health care provider.   Document Released: 06/26/2004 Document Revised: 06/09/2014 Document Reviewed: 06/24/2013 Elsevier Interactive Patient Education 2016 [employer] Inc[employer]  

## 2015-06-28 NOTE — NC FL2 (Signed)
[place] MEDICAID FL2 LEVEL OF CARE SCREENING TOOL     IDENTIFICATION  Patient Name: [name] Birthdate: 1921/01/05 Sex: female Admission Date (Current Location): 06/26/2015  [facility] and [place] Number:  Producer, television/film/video and Address:  [facility],  [address] [place] [address]      Provider Number: [id number]  Attending Physician Name and Address:  Dorothea Ogle, MD  Relative Name and Phone Number:       Current Level of Care: Hospital Recommended Level of Care: Memory Care, Assisted Living Facility Prior Approval Number:    Date Approved/Denied:   PASRR Number: [id number]  Discharge Plan: Other (Comment) (ALF, memory care)    Current Diagnoses: Patient Active Problem List   Diagnosis Date Noted  . Altered mental status   . Confusion 06/26/2015  . Fall   . Aspiration pneumonia (HCC) 01/10/2015  . Acute encephalopathy 01/08/2015  . UTI (urinary tract infection) 01/08/2015  . 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 Recurrent falls 11/27/2014  . FTT (failure to thrive) in adult 10/26/2014  . Dehydration 10/26/2014  . Abnormal urinalysis 10/26/2014  . Normocytic anemia 10/26/2014  . Lump of skin of left upper extremity 09/07/2014  . Rash and nonspecific skin eruption 05/08/2014  . Syncope and collapse 11/30/2013  . Low back pain, episodic 11/20/2013  . Chronic kidney disease   . Insomnia 08/31/2013  . Arthritis 10/02/2012  . Valvular heart disease 09/04/2012  . Restless leg syndrome 06/06/2012  . Trochanteric bursitis 08/02/2011  . Dementia 08/02/2011  . Leg pain 03/28/2011  . Hypertension 09/22/2010  . Hypothyroidism 09/22/2010    Orientation RESPIRATION BLADDER Height & Weight    Self, Situation  Normal (Room air) Incontinent   159 lbs.  BEHAVIORAL SYMPTOMS/MOOD NEUROLOGICAL BOWEL NUTRITION STATUS      Continent Diet (Soft)  AMBULATORY STATUS COMMUNICATION OF NEEDS Skin     Verbally Normal                      Personal Care Assistance Level of Assistance              Functional Limitations Info             SPECIAL CARE FACTORS FREQUENCY                       Contractures Contractures Info: Not present    Additional Factors Info  Code Status Code Status Info: FULL Allergies Info: Morphine and related, Codeine, Hydrocodone           Current Medications (06/28/2015):  This is the current hospital active medication list Current Facility-Administered Medications  Medication Dose Route Frequency Provider Last Rate Last Dose  . 0.9 %  sodium chloride infusion   Intravenous Continuous Dorothea Ogle, MD 75 mL/hr at 06/26/15 2013    . acetaminophen (TYLENOL) tablet 500 mg  500 mg Oral Q4H PRN Dorothea Ogle, MD   500 mg at 06/27/15 0329  . albuterol (PROVENTIL) (2.5 MG/3ML) 0.083% nebulizer solution 2.5 mg  2.5 mg Nebulization Q6H PRN Dorothea Ogle, MD      . aspirin chewable tablet 81 mg  81 mg Oral Daily Dorothea Ogle, MD   81 mg at 06/28/15 0943  . clonazePAM (KLONOPIN) tablet 0.5 mg  0.5 mg Oral TID PRN Dorothea Ogle, MD   0.5 mg at 06/28/15 1015  . enoxaparin (LOVENOX) injection 40 mg  40  mg Subcutaneous Q24H Iskra M Myers, MD   40 mg at 06/27/15 2124  . guaiFENesin (ROBITUDorothea OgleMG/5ML solution 200 mg  10 mL Oral Q6H PRN Dorothea Ogle, MD      . levothyroxine (SYNTHROID, LEVOTHROID) tablet 50 mcg  50 mcg Oral QAC breakfast Dorothea Ogle, MD   50 mcg at 06/28/15 0943  . magnesium hydroxide (MILK OF MAGNESIA) suspension 30 mL  30 mL Oral QHS PRN Dorothea Ogle, MD      . neomycin-bacitracin-polymyxin (NEOSPORIN) ointment 1 application  1 application Topical Daily PRN Dorothea Ogle, MD      . ondansetron [facility] tablet 4 mg  4 mg Oral Q6H PRN Dorothea Ogle, MD       Or  . ondansetron [facility]) injection 4 mg  4 mg Intravenous Q6H PRN Dorothea Ogle, MD      . risperiDONE (RISPERDAL) tablet 1 mg  1 mg Oral QHS Dorothea Ogle, MD   1 mg at 06/27/15 2124  .  rivastigmine (EXELON) capsule 3 mg  3 mg Oral BID Dorothea Ogle, MD   3 mg at 06/28/15 0943  . traZODone (DESYREL) tablet 50 mg  50 mg Oral QHS PRN Dorothea Ogle, MD   50 mg at 06/26/15 2214     Discharge Medications: Please see discharge summary for a list of discharge medications.  Relevant Imaging Results:  Relevant Lab Results:   Additional Information SSN: [id number]  Adrian Blackwater, LCSW

## 2015-06-28 NOTE — Progress Notes (Signed)
Report called to Clydie Braun at [facility].

## 2015-06-28 NOTE — Discharge Summary (Signed)
Physician Discharge Summary  [name]:[id number] DOB: 1921/03/20 DOA: 06/26/2015  PCP: No primary care provider on file.  Admit date: 06/26/2015 Discharge date: 06/28/2015  Recommendations for Outpatient Follow-up:  1. Pt will need to follow up with PCP in 2-3 weeks post discharge  Discharge Diagnoses:  Active Problems:   Confusion   Discharge Condition: Stable  Diet recommendation: Heart healthy diet if pt able to tolerate, high risk aspiration, provide assistance and aspiration precautions    Brief narrative:    82 year old female with a history of dementia, hypertension, hypothyroidism, frequent falls presented to the emergency department when she was found to be lethargic at the living facility [facility]. Please note that pt is unable to provide any information due to confusion and dementia, per ED doctor, no specific information provided upon pt's arrival except change in mental status, no reported chest pain or shortness of breath, no fevers or chills, no reported abd concerns.   In ED, pt was alert but confused, VSS, blood work fairly unremarkable. NO clear source of AMS elicited in ED and it was determined pt would benefit from an admission for further evaluation.   Assessment/Plan:    Active Problems: Acute encephalopathy - no clear metabolic etiology evaluated - likely progression of dementia  - CXR with no acute cardiopulmonary findings, UA unremarkable  - CT head with chronic changes - continue to hold scheduled Clonazepam that pt takes at the facility, allow antianxiety meds only if needed and if pt alert enough to take it - CBC and BMP stable   Hypothyroidism  - continue synthroid   Dementia - per record review, at baseline   Constipation vs Diarrhea - per home medical regimen   Code Status: Full.  Family Communication: no family at bedside  Disposition Plan: SNF   IV access:  Peripheral IV  Procedures and diagnostic studies:    Ct Head Wo Contrast 06/26/2015 Stable cerebral and cerebellar atrophy with mild periventricular white matter disease. No acute intracranial findings.  Dg Chest Port 1 View 06/26/2015 Suboptimal inspiration. Stable mild cardiomegaly. No acute cardiopulmonary disease.   Medical Consultants:  None   Other Consultants:  PT/OT  IAnti-Infectives:   None   [name], MD [facility] Pager [phone]   If 7PM-7AM, please contact night-coverage [email or web address] Password [email or web address]      Discharge Exam: Filed Vitals:   06/27/15 2041 06/28/15 0614  BP: 154/52 167/63  Pulse: 73 61  Temp: 99 F (37.2 C) 98.8 F (37.1 C)  Resp:     Filed Vitals:   06/27/15 0555 06/27/15 1407 06/27/15 2041 06/28/15 0614  BP: 128/50 122/47 154/52 167/63  Pulse: 71 67 73 61  Temp:  98.2 F (36.8 C) 99 F (37.2 C) 98.8 F (37.1 C)  TempSrc:  Axillary Oral Oral  Resp:  14    SpO2:  98% 94% 96%    General: Pt is alert, confused  Cardiovascular: Regular rate and rhythm,  no rubs, no gallops Respiratory: Clear to auscultation bilaterally, no wheezing, no crackles, no rhonchi Abdominal: Soft, non tender, non distended, bowel sounds +, no guarding  Discharge Instructions  Discharge Instructions    Diet - low sodium heart healthy    Complete by:  As directed      Increase activity slowly    Complete by:  As directed             Medication List    TAKE these medications        acetaminophen 500  MG tablet  Commonly known as:  TYLENOL  Take 500 mg by mouth every 4 (four) hours as needed for mild pain, fever or headache.     albuterol 108 (90 Base) MCG/ACT inhaler  Commonly known as:  PROVENTIL HFA;VENTOLIN HFA  Inhale 2 puffs into the lungs every 6 (six) hours as needed for wheezing or shortness of breath.     alum & mag hydroxide-simeth 200-200-20 MG/5ML suspension  Commonly known as:  MAALOX/MYLANTA  Take 30 mLs by mouth every 6 (six) hours as needed for indigestion or  heartburn.     aspirin 81 MG chewable tablet  Chew 81 mg by mouth daily.     clonazePAM 0.5 MG tablet  Commonly known as:  KLONOPIN  Take 1 tablet (0.5 mg total) by mouth every 4 (four) hours as needed for anxiety.     levothyroxine 50 MCG tablet  Commonly known as:  SYNTHROID, LEVOTHROID  TAKE 1 TABLET (50 MCG TOTAL) BY MOUTH DAILY.     loperamide 2 MG capsule  Commonly known as:  IMODIUM  Take 2 mg by mouth every 3 (three) hours as needed for diarrhea or loose stools.     magnesium hydroxide 400 MG/5ML suspension  Commonly known as:  MILK OF MAGNESIA  Take 30 mLs by mouth at bedtime as needed for mild constipation.     neomycin-bacitracin-polymyxin ointment  Commonly known as:  NEOSPORIN  Apply 1 application topically daily as needed for wound care.     Q-TUSSIN 100 MG/5ML liquid  Generic drug:  guaiFENesin  Take 200 mg by mouth every 6 (six) hours as needed for cough.     risperiDONE 1 MG tablet  Commonly known as:  RISPERDAL  Take 1 mg by mouth at bedtime.     rivastigmine 3 MG capsule  Commonly known as:  EXELON  Take 3 mg by mouth 2 (two) times daily.     senna-docusate 8.6-50 MG tablet  Commonly known as:  Senokot-S  Take 1 tablet by mouth 2 (two) times daily.     traZODone 50 MG tablet  Commonly known as:  DESYREL  Take 50 mg by mouth at bedtime as needed for sleep.           Follow-up Information    Call [name], MD.   Specialty:  Internal Medicine   Why:  As needed   Contact information:   [address] [place] [address] [phone]        The results of significant diagnostics from this hospitalization (including imaging, microbiology, ancillary and laboratory) are listed below for reference.     Microbiology: Recent Results (from the past 240 hour(s))  Culture, Urine     Status: None   Collection Time: 06/26/15 11:59 PM  Result Value Ref Range Status   Specimen Description URINE, RANDOM  Final    Special Requests NONE  Final   Culture   Final    NO GROWTH 1 DAY Performed at [facility]    Report Status 06/28/2015 FINAL  Final     Labs: Basic Metabolic Panel:  Recent Labs Lab 06/26/15 1325 06/27/15 0530  NA 145 144  K 4.0 4.1  CL 108 110  CO2 29 26  GLUCOSE 102* 124*  BUN 16 16  CREATININE 0.83 0.76  CALCIUM 9.1 9.0   Liver Function Tests:  Recent Labs Lab 06/26/15 1325  AST 14*  ALT 14  ALKPHOS 73  BILITOT 0.5  PROT 6.3*  ALBUMIN 3.3*   No results for input(s): LIPASE, AMYLASE in the last 168 hours. No results for input(s): AMMONIA in the last 168 hours. CBC:  Recent Labs Lab 06/26/15 1325 06/27/15 0530  WBC 8.6 10.1  NEUTROABS 6.0  --   HGB 10.8* 11.0*  HCT 32.4* 33.2*  MCV 85.5 87.1  PLT 228 241   Cardiac Enzymes:  Recent Labs Lab 06/26/15 1325  TROPONINI <0.03   SIGNED: Time coordinating discharge: 30 minutes  [name], MD  [facility] 06/28/2015, 10:52 AM Pager [phone]  If 7PM-7AM, please contact night-coverage [email or web address] Password [email or web address]

## 2015-06-29 DIAGNOSIS — G0430 Acute necrotizing hemorrhagic encephalopathy, unspecified: Secondary | ICD-10-CM | POA: Diagnosis not present

## 2015-06-30 DIAGNOSIS — G0430 Acute necrotizing hemorrhagic encephalopathy, unspecified: Secondary | ICD-10-CM | POA: Diagnosis not present

## 2015-07-01 DIAGNOSIS — G0430 Acute necrotizing hemorrhagic encephalopathy, unspecified: Secondary | ICD-10-CM | POA: Diagnosis not present

## 2015-07-02 DIAGNOSIS — G0430 Acute necrotizing hemorrhagic encephalopathy, unspecified: Secondary | ICD-10-CM | POA: Diagnosis not present

## 2015-07-03 DIAGNOSIS — G0430 Acute necrotizing hemorrhagic encephalopathy, unspecified: Secondary | ICD-10-CM | POA: Diagnosis not present

## 2015-07-04 DIAGNOSIS — G0430 Acute necrotizing hemorrhagic encephalopathy, unspecified: Secondary | ICD-10-CM | POA: Diagnosis not present

## 2015-07-05 DIAGNOSIS — F0281 Dementia in other diseases classified elsewhere with behavioral disturbance: Secondary | ICD-10-CM | POA: Diagnosis not present

## 2015-07-05 DIAGNOSIS — G0430 Acute necrotizing hemorrhagic encephalopathy, unspecified: Secondary | ICD-10-CM | POA: Diagnosis not present

## 2015-07-05 DIAGNOSIS — I10 Essential (primary) hypertension: Secondary | ICD-10-CM | POA: Diagnosis not present

## 2015-07-05 DIAGNOSIS — G301 Alzheimer's disease with late onset: Secondary | ICD-10-CM | POA: Diagnosis not present

## 2015-07-06 DIAGNOSIS — G0430 Acute necrotizing hemorrhagic encephalopathy, unspecified: Secondary | ICD-10-CM | POA: Diagnosis not present

## 2015-07-07 DIAGNOSIS — G0430 Acute necrotizing hemorrhagic encephalopathy, unspecified: Secondary | ICD-10-CM | POA: Diagnosis not present

## 2015-07-08 DIAGNOSIS — L89312 Pressure ulcer of right buttock, stage 2: Secondary | ICD-10-CM | POA: Diagnosis not present

## 2015-07-08 DIAGNOSIS — F0280 Dementia in other diseases classified elsewhere without behavioral disturbance: Secondary | ICD-10-CM | POA: Diagnosis not present

## 2015-07-08 DIAGNOSIS — I129 Hypertensive chronic kidney disease with stage 1 through stage 4 chronic kidney disease, or unspecified chronic kidney disease: Secondary | ICD-10-CM | POA: Diagnosis not present

## 2015-07-08 DIAGNOSIS — G309 Alzheimer's disease, unspecified: Secondary | ICD-10-CM | POA: Diagnosis not present

## 2015-07-08 DIAGNOSIS — M1990 Unspecified osteoarthritis, unspecified site: Secondary | ICD-10-CM | POA: Diagnosis not present

## 2015-07-08 DIAGNOSIS — G0430 Acute necrotizing hemorrhagic encephalopathy, unspecified: Secondary | ICD-10-CM | POA: Diagnosis not present

## 2015-07-08 DIAGNOSIS — G8191 Hemiplegia, unspecified affecting right dominant side: Secondary | ICD-10-CM | POA: Diagnosis not present

## 2015-07-09 DIAGNOSIS — G0430 Acute necrotizing hemorrhagic encephalopathy, unspecified: Secondary | ICD-10-CM | POA: Diagnosis not present

## 2015-07-10 DIAGNOSIS — G0430 Acute necrotizing hemorrhagic encephalopathy, unspecified: Secondary | ICD-10-CM | POA: Diagnosis not present

## 2015-07-10 DIAGNOSIS — R32 Unspecified urinary incontinence: Secondary | ICD-10-CM | POA: Diagnosis not present

## 2015-07-11 DIAGNOSIS — G0430 Acute necrotizing hemorrhagic encephalopathy, unspecified: Secondary | ICD-10-CM | POA: Diagnosis not present

## 2015-07-12 DIAGNOSIS — F0280 Dementia in other diseases classified elsewhere without behavioral disturbance: Secondary | ICD-10-CM | POA: Diagnosis not present

## 2015-07-12 DIAGNOSIS — G309 Alzheimer's disease, unspecified: Secondary | ICD-10-CM | POA: Diagnosis not present

## 2015-07-12 DIAGNOSIS — I129 Hypertensive chronic kidney disease with stage 1 through stage 4 chronic kidney disease, or unspecified chronic kidney disease: Secondary | ICD-10-CM | POA: Diagnosis not present

## 2015-07-12 DIAGNOSIS — G0430 Acute necrotizing hemorrhagic encephalopathy, unspecified: Secondary | ICD-10-CM | POA: Diagnosis not present

## 2015-07-12 DIAGNOSIS — M1990 Unspecified osteoarthritis, unspecified site: Secondary | ICD-10-CM | POA: Diagnosis not present

## 2015-07-12 DIAGNOSIS — G8191 Hemiplegia, unspecified affecting right dominant side: Secondary | ICD-10-CM | POA: Diagnosis not present

## 2015-07-12 DIAGNOSIS — L89312 Pressure ulcer of right buttock, stage 2: Secondary | ICD-10-CM | POA: Diagnosis not present

## 2015-07-13 DIAGNOSIS — G0430 Acute necrotizing hemorrhagic encephalopathy, unspecified: Secondary | ICD-10-CM | POA: Diagnosis not present

## 2015-07-14 DIAGNOSIS — M1990 Unspecified osteoarthritis, unspecified site: Secondary | ICD-10-CM | POA: Diagnosis not present

## 2015-07-14 DIAGNOSIS — F0280 Dementia in other diseases classified elsewhere without behavioral disturbance: Secondary | ICD-10-CM | POA: Diagnosis not present

## 2015-07-14 DIAGNOSIS — I129 Hypertensive chronic kidney disease with stage 1 through stage 4 chronic kidney disease, or unspecified chronic kidney disease: Secondary | ICD-10-CM | POA: Diagnosis not present

## 2015-07-14 DIAGNOSIS — G0430 Acute necrotizing hemorrhagic encephalopathy, unspecified: Secondary | ICD-10-CM | POA: Diagnosis not present

## 2015-07-14 DIAGNOSIS — G8191 Hemiplegia, unspecified affecting right dominant side: Secondary | ICD-10-CM | POA: Diagnosis not present

## 2015-07-14 DIAGNOSIS — L89312 Pressure ulcer of right buttock, stage 2: Secondary | ICD-10-CM | POA: Diagnosis not present

## 2015-07-14 DIAGNOSIS — G309 Alzheimer's disease, unspecified: Secondary | ICD-10-CM | POA: Diagnosis not present

## 2015-07-15 DIAGNOSIS — G0430 Acute necrotizing hemorrhagic encephalopathy, unspecified: Secondary | ICD-10-CM | POA: Diagnosis not present

## 2015-07-16 DIAGNOSIS — F0280 Dementia in other diseases classified elsewhere without behavioral disturbance: Secondary | ICD-10-CM | POA: Diagnosis not present

## 2015-07-16 DIAGNOSIS — G309 Alzheimer's disease, unspecified: Secondary | ICD-10-CM | POA: Diagnosis not present

## 2015-07-16 DIAGNOSIS — L89312 Pressure ulcer of right buttock, stage 2: Secondary | ICD-10-CM | POA: Diagnosis not present

## 2015-07-16 DIAGNOSIS — M1990 Unspecified osteoarthritis, unspecified site: Secondary | ICD-10-CM | POA: Diagnosis not present

## 2015-07-16 DIAGNOSIS — I129 Hypertensive chronic kidney disease with stage 1 through stage 4 chronic kidney disease, or unspecified chronic kidney disease: Secondary | ICD-10-CM | POA: Diagnosis not present

## 2015-07-16 DIAGNOSIS — G8191 Hemiplegia, unspecified affecting right dominant side: Secondary | ICD-10-CM | POA: Diagnosis not present

## 2015-07-16 DIAGNOSIS — G0430 Acute necrotizing hemorrhagic encephalopathy, unspecified: Secondary | ICD-10-CM | POA: Diagnosis not present

## 2015-07-17 DIAGNOSIS — G0430 Acute necrotizing hemorrhagic encephalopathy, unspecified: Secondary | ICD-10-CM | POA: Diagnosis not present

## 2015-07-18 DIAGNOSIS — G309 Alzheimer's disease, unspecified: Secondary | ICD-10-CM | POA: Diagnosis not present

## 2015-07-18 DIAGNOSIS — F0280 Dementia in other diseases classified elsewhere without behavioral disturbance: Secondary | ICD-10-CM | POA: Diagnosis not present

## 2015-07-18 DIAGNOSIS — I129 Hypertensive chronic kidney disease with stage 1 through stage 4 chronic kidney disease, or unspecified chronic kidney disease: Secondary | ICD-10-CM | POA: Diagnosis not present

## 2015-07-18 DIAGNOSIS — G8191 Hemiplegia, unspecified affecting right dominant side: Secondary | ICD-10-CM | POA: Diagnosis not present

## 2015-07-18 DIAGNOSIS — M1990 Unspecified osteoarthritis, unspecified site: Secondary | ICD-10-CM | POA: Diagnosis not present

## 2015-07-18 DIAGNOSIS — L89312 Pressure ulcer of right buttock, stage 2: Secondary | ICD-10-CM | POA: Diagnosis not present

## 2015-07-18 DIAGNOSIS — G0430 Acute necrotizing hemorrhagic encephalopathy, unspecified: Secondary | ICD-10-CM | POA: Diagnosis not present

## 2015-07-19 DIAGNOSIS — G0430 Acute necrotizing hemorrhagic encephalopathy, unspecified: Secondary | ICD-10-CM | POA: Diagnosis not present

## 2015-07-20 DIAGNOSIS — F0280 Dementia in other diseases classified elsewhere without behavioral disturbance: Secondary | ICD-10-CM | POA: Diagnosis not present

## 2015-07-20 DIAGNOSIS — G309 Alzheimer's disease, unspecified: Secondary | ICD-10-CM | POA: Diagnosis not present

## 2015-07-20 DIAGNOSIS — G8191 Hemiplegia, unspecified affecting right dominant side: Secondary | ICD-10-CM | POA: Diagnosis not present

## 2015-07-20 DIAGNOSIS — M1990 Unspecified osteoarthritis, unspecified site: Secondary | ICD-10-CM | POA: Diagnosis not present

## 2015-07-20 DIAGNOSIS — G0430 Acute necrotizing hemorrhagic encephalopathy, unspecified: Secondary | ICD-10-CM | POA: Diagnosis not present

## 2015-07-20 DIAGNOSIS — I129 Hypertensive chronic kidney disease with stage 1 through stage 4 chronic kidney disease, or unspecified chronic kidney disease: Secondary | ICD-10-CM | POA: Diagnosis not present

## 2015-07-20 DIAGNOSIS — L89312 Pressure ulcer of right buttock, stage 2: Secondary | ICD-10-CM | POA: Diagnosis not present

## 2015-07-21 DIAGNOSIS — G0430 Acute necrotizing hemorrhagic encephalopathy, unspecified: Secondary | ICD-10-CM | POA: Diagnosis not present

## 2015-07-22 DIAGNOSIS — G0430 Acute necrotizing hemorrhagic encephalopathy, unspecified: Secondary | ICD-10-CM | POA: Diagnosis not present

## 2015-07-23 DIAGNOSIS — G0430 Acute necrotizing hemorrhagic encephalopathy, unspecified: Secondary | ICD-10-CM | POA: Diagnosis not present

## 2015-07-24 DIAGNOSIS — L89312 Pressure ulcer of right buttock, stage 2: Secondary | ICD-10-CM | POA: Diagnosis not present

## 2015-07-24 DIAGNOSIS — G0430 Acute necrotizing hemorrhagic encephalopathy, unspecified: Secondary | ICD-10-CM | POA: Diagnosis not present

## 2015-07-24 DIAGNOSIS — M1990 Unspecified osteoarthritis, unspecified site: Secondary | ICD-10-CM | POA: Diagnosis not present

## 2015-07-24 DIAGNOSIS — I129 Hypertensive chronic kidney disease with stage 1 through stage 4 chronic kidney disease, or unspecified chronic kidney disease: Secondary | ICD-10-CM | POA: Diagnosis not present

## 2015-07-24 DIAGNOSIS — F0280 Dementia in other diseases classified elsewhere without behavioral disturbance: Secondary | ICD-10-CM | POA: Diagnosis not present

## 2015-07-24 DIAGNOSIS — G309 Alzheimer's disease, unspecified: Secondary | ICD-10-CM | POA: Diagnosis not present

## 2015-07-24 DIAGNOSIS — G8191 Hemiplegia, unspecified affecting right dominant side: Secondary | ICD-10-CM | POA: Diagnosis not present

## 2015-07-25 DIAGNOSIS — L89312 Pressure ulcer of right buttock, stage 2: Secondary | ICD-10-CM | POA: Diagnosis not present

## 2015-07-25 DIAGNOSIS — M1990 Unspecified osteoarthritis, unspecified site: Secondary | ICD-10-CM | POA: Diagnosis not present

## 2015-07-25 DIAGNOSIS — I129 Hypertensive chronic kidney disease with stage 1 through stage 4 chronic kidney disease, or unspecified chronic kidney disease: Secondary | ICD-10-CM | POA: Diagnosis not present

## 2015-07-25 DIAGNOSIS — G309 Alzheimer's disease, unspecified: Secondary | ICD-10-CM | POA: Diagnosis not present

## 2015-07-25 DIAGNOSIS — G0430 Acute necrotizing hemorrhagic encephalopathy, unspecified: Secondary | ICD-10-CM | POA: Diagnosis not present

## 2015-07-25 DIAGNOSIS — F0280 Dementia in other diseases classified elsewhere without behavioral disturbance: Secondary | ICD-10-CM | POA: Diagnosis not present

## 2015-07-25 DIAGNOSIS — G8191 Hemiplegia, unspecified affecting right dominant side: Secondary | ICD-10-CM | POA: Diagnosis not present

## 2015-07-26 DIAGNOSIS — L89312 Pressure ulcer of right buttock, stage 2: Secondary | ICD-10-CM | POA: Diagnosis not present

## 2015-07-26 DIAGNOSIS — F0280 Dementia in other diseases classified elsewhere without behavioral disturbance: Secondary | ICD-10-CM | POA: Diagnosis not present

## 2015-07-26 DIAGNOSIS — I129 Hypertensive chronic kidney disease with stage 1 through stage 4 chronic kidney disease, or unspecified chronic kidney disease: Secondary | ICD-10-CM | POA: Diagnosis not present

## 2015-07-26 DIAGNOSIS — G0430 Acute necrotizing hemorrhagic encephalopathy, unspecified: Secondary | ICD-10-CM | POA: Diagnosis not present

## 2015-07-26 DIAGNOSIS — M1990 Unspecified osteoarthritis, unspecified site: Secondary | ICD-10-CM | POA: Diagnosis not present

## 2015-07-26 DIAGNOSIS — G309 Alzheimer's disease, unspecified: Secondary | ICD-10-CM | POA: Diagnosis not present

## 2015-07-26 DIAGNOSIS — G8191 Hemiplegia, unspecified affecting right dominant side: Secondary | ICD-10-CM | POA: Diagnosis not present

## 2015-07-27 DIAGNOSIS — L89312 Pressure ulcer of right buttock, stage 2: Secondary | ICD-10-CM | POA: Diagnosis not present

## 2015-07-27 DIAGNOSIS — I129 Hypertensive chronic kidney disease with stage 1 through stage 4 chronic kidney disease, or unspecified chronic kidney disease: Secondary | ICD-10-CM | POA: Diagnosis not present

## 2015-07-27 DIAGNOSIS — M1990 Unspecified osteoarthritis, unspecified site: Secondary | ICD-10-CM | POA: Diagnosis not present

## 2015-07-27 DIAGNOSIS — G309 Alzheimer's disease, unspecified: Secondary | ICD-10-CM | POA: Diagnosis not present

## 2015-07-27 DIAGNOSIS — G0430 Acute necrotizing hemorrhagic encephalopathy, unspecified: Secondary | ICD-10-CM | POA: Diagnosis not present

## 2015-07-27 DIAGNOSIS — F0280 Dementia in other diseases classified elsewhere without behavioral disturbance: Secondary | ICD-10-CM | POA: Diagnosis not present

## 2015-07-27 DIAGNOSIS — G8191 Hemiplegia, unspecified affecting right dominant side: Secondary | ICD-10-CM | POA: Diagnosis not present

## 2015-07-28 DIAGNOSIS — G0430 Acute necrotizing hemorrhagic encephalopathy, unspecified: Secondary | ICD-10-CM | POA: Diagnosis not present

## 2015-07-29 DIAGNOSIS — G0430 Acute necrotizing hemorrhagic encephalopathy, unspecified: Secondary | ICD-10-CM | POA: Diagnosis not present

## 2015-07-30 DIAGNOSIS — M1990 Unspecified osteoarthritis, unspecified site: Secondary | ICD-10-CM | POA: Diagnosis not present

## 2015-07-30 DIAGNOSIS — L89312 Pressure ulcer of right buttock, stage 2: Secondary | ICD-10-CM | POA: Diagnosis not present

## 2015-07-30 DIAGNOSIS — G8191 Hemiplegia, unspecified affecting right dominant side: Secondary | ICD-10-CM | POA: Diagnosis not present

## 2015-07-30 DIAGNOSIS — G309 Alzheimer's disease, unspecified: Secondary | ICD-10-CM | POA: Diagnosis not present

## 2015-07-30 DIAGNOSIS — I129 Hypertensive chronic kidney disease with stage 1 through stage 4 chronic kidney disease, or unspecified chronic kidney disease: Secondary | ICD-10-CM | POA: Diagnosis not present

## 2015-07-30 DIAGNOSIS — F0280 Dementia in other diseases classified elsewhere without behavioral disturbance: Secondary | ICD-10-CM | POA: Diagnosis not present

## 2015-08-01 DIAGNOSIS — I129 Hypertensive chronic kidney disease with stage 1 through stage 4 chronic kidney disease, or unspecified chronic kidney disease: Secondary | ICD-10-CM | POA: Diagnosis not present

## 2015-08-01 DIAGNOSIS — M1990 Unspecified osteoarthritis, unspecified site: Secondary | ICD-10-CM | POA: Diagnosis not present

## 2015-08-01 DIAGNOSIS — F0280 Dementia in other diseases classified elsewhere without behavioral disturbance: Secondary | ICD-10-CM | POA: Diagnosis not present

## 2015-08-01 DIAGNOSIS — G309 Alzheimer's disease, unspecified: Secondary | ICD-10-CM | POA: Diagnosis not present

## 2015-08-01 DIAGNOSIS — L89312 Pressure ulcer of right buttock, stage 2: Secondary | ICD-10-CM | POA: Diagnosis not present

## 2015-08-01 DIAGNOSIS — G8191 Hemiplegia, unspecified affecting right dominant side: Secondary | ICD-10-CM | POA: Diagnosis not present

## 2015-08-02 DIAGNOSIS — G309 Alzheimer's disease, unspecified: Secondary | ICD-10-CM | POA: Diagnosis not present

## 2015-08-02 DIAGNOSIS — I10 Essential (primary) hypertension: Secondary | ICD-10-CM | POA: Diagnosis not present

## 2015-08-02 DIAGNOSIS — I129 Hypertensive chronic kidney disease with stage 1 through stage 4 chronic kidney disease, or unspecified chronic kidney disease: Secondary | ICD-10-CM | POA: Diagnosis not present

## 2015-08-02 DIAGNOSIS — L89312 Pressure ulcer of right buttock, stage 2: Secondary | ICD-10-CM | POA: Diagnosis not present

## 2015-08-02 DIAGNOSIS — G8191 Hemiplegia, unspecified affecting right dominant side: Secondary | ICD-10-CM | POA: Diagnosis not present

## 2015-08-02 DIAGNOSIS — M1990 Unspecified osteoarthritis, unspecified site: Secondary | ICD-10-CM | POA: Diagnosis not present

## 2015-08-02 DIAGNOSIS — F0280 Dementia in other diseases classified elsewhere without behavioral disturbance: Secondary | ICD-10-CM | POA: Diagnosis not present

## 2015-08-02 DIAGNOSIS — F0281 Dementia in other diseases classified elsewhere with behavioral disturbance: Secondary | ICD-10-CM | POA: Diagnosis not present

## 2015-08-02 DIAGNOSIS — G301 Alzheimer's disease with late onset: Secondary | ICD-10-CM | POA: Diagnosis not present

## 2015-08-03 ENCOUNTER — Other Ambulatory Visit: Payer: Self-pay

## 2015-08-03 DIAGNOSIS — M1990 Unspecified osteoarthritis, unspecified site: Secondary | ICD-10-CM | POA: Diagnosis not present

## 2015-08-03 DIAGNOSIS — F0280 Dementia in other diseases classified elsewhere without behavioral disturbance: Secondary | ICD-10-CM | POA: Diagnosis not present

## 2015-08-03 DIAGNOSIS — G309 Alzheimer's disease, unspecified: Secondary | ICD-10-CM | POA: Diagnosis not present

## 2015-08-03 DIAGNOSIS — I129 Hypertensive chronic kidney disease with stage 1 through stage 4 chronic kidney disease, or unspecified chronic kidney disease: Secondary | ICD-10-CM | POA: Diagnosis not present

## 2015-08-03 DIAGNOSIS — G8191 Hemiplegia, unspecified affecting right dominant side: Secondary | ICD-10-CM | POA: Diagnosis not present

## 2015-08-03 DIAGNOSIS — L89312 Pressure ulcer of right buttock, stage 2: Secondary | ICD-10-CM | POA: Diagnosis not present

## 2015-08-03 NOTE — Patient Outreach (Signed)
[facility]) Care Management  08/03/2015  [name] 18-Apr-1921 [id number]   Referral Date:  07/24/15 Source;  [facility] [employer] [employer] tier 4 Issue:  Per data:  9 ED visits and 1 admission.  DM.   Outreach call to caregiver, son, [name], [place] and daughter in law, [name] [phone] H/o dementia; [facility], [address], [place], [place] [address] Phone [phone]. Contact: Cammy CopaBrooke Wood Excutive Director Contact: Johney MaineKaren: Head Nurse [phone] States [facility] due to weakness on right side associated to possible stroke.  States patient is not walking and is in a wheel chair most of time.  States no needs that facility is not able to manage.  States patient will remain in facility for LTC services and no plans to ever return back to home.   Plan: Case closed:  Patient assessed; no further intervention needed (LTC facility).   Donato Schultzrystal Letecia Arps, RN, BSN, [facility], CCM  [facility] Care Management Care Management Coordinator [phone] Direct [phone] Cell [phone] Office [phone] Fax

## 2015-08-06 DIAGNOSIS — G309 Alzheimer's disease, unspecified: Secondary | ICD-10-CM | POA: Diagnosis not present

## 2015-08-06 DIAGNOSIS — G0430 Acute necrotizing hemorrhagic encephalopathy, unspecified: Secondary | ICD-10-CM | POA: Diagnosis not present

## 2015-08-06 DIAGNOSIS — I129 Hypertensive chronic kidney disease with stage 1 through stage 4 chronic kidney disease, or unspecified chronic kidney disease: Secondary | ICD-10-CM | POA: Diagnosis not present

## 2015-08-06 DIAGNOSIS — F0280 Dementia in other diseases classified elsewhere without behavioral disturbance: Secondary | ICD-10-CM | POA: Diagnosis not present

## 2015-08-06 DIAGNOSIS — L89312 Pressure ulcer of right buttock, stage 2: Secondary | ICD-10-CM | POA: Diagnosis not present

## 2015-08-06 DIAGNOSIS — M1990 Unspecified osteoarthritis, unspecified site: Secondary | ICD-10-CM | POA: Diagnosis not present

## 2015-08-06 DIAGNOSIS — G8191 Hemiplegia, unspecified affecting right dominant side: Secondary | ICD-10-CM | POA: Diagnosis not present

## 2015-08-07 DIAGNOSIS — G0430 Acute necrotizing hemorrhagic encephalopathy, unspecified: Secondary | ICD-10-CM | POA: Diagnosis not present

## 2015-08-08 DIAGNOSIS — M1990 Unspecified osteoarthritis, unspecified site: Secondary | ICD-10-CM | POA: Diagnosis not present

## 2015-08-08 DIAGNOSIS — I129 Hypertensive chronic kidney disease with stage 1 through stage 4 chronic kidney disease, or unspecified chronic kidney disease: Secondary | ICD-10-CM | POA: Diagnosis not present

## 2015-08-08 DIAGNOSIS — L89312 Pressure ulcer of right buttock, stage 2: Secondary | ICD-10-CM | POA: Diagnosis not present

## 2015-08-08 DIAGNOSIS — F0280 Dementia in other diseases classified elsewhere without behavioral disturbance: Secondary | ICD-10-CM | POA: Diagnosis not present

## 2015-08-08 DIAGNOSIS — G8191 Hemiplegia, unspecified affecting right dominant side: Secondary | ICD-10-CM | POA: Diagnosis not present

## 2015-08-08 DIAGNOSIS — G0430 Acute necrotizing hemorrhagic encephalopathy, unspecified: Secondary | ICD-10-CM | POA: Diagnosis not present

## 2015-08-08 DIAGNOSIS — G309 Alzheimer's disease, unspecified: Secondary | ICD-10-CM | POA: Diagnosis not present

## 2015-08-09 DIAGNOSIS — G309 Alzheimer's disease, unspecified: Secondary | ICD-10-CM | POA: Diagnosis not present

## 2015-08-09 DIAGNOSIS — I129 Hypertensive chronic kidney disease with stage 1 through stage 4 chronic kidney disease, or unspecified chronic kidney disease: Secondary | ICD-10-CM | POA: Diagnosis not present

## 2015-08-09 DIAGNOSIS — M1990 Unspecified osteoarthritis, unspecified site: Secondary | ICD-10-CM | POA: Diagnosis not present

## 2015-08-09 DIAGNOSIS — G0430 Acute necrotizing hemorrhagic encephalopathy, unspecified: Secondary | ICD-10-CM | POA: Diagnosis not present

## 2015-08-09 DIAGNOSIS — F0280 Dementia in other diseases classified elsewhere without behavioral disturbance: Secondary | ICD-10-CM | POA: Diagnosis not present

## 2015-08-09 DIAGNOSIS — L89312 Pressure ulcer of right buttock, stage 2: Secondary | ICD-10-CM | POA: Diagnosis not present

## 2015-08-09 DIAGNOSIS — G8191 Hemiplegia, unspecified affecting right dominant side: Secondary | ICD-10-CM | POA: Diagnosis not present

## 2015-08-10 DIAGNOSIS — G8191 Hemiplegia, unspecified affecting right dominant side: Secondary | ICD-10-CM | POA: Diagnosis not present

## 2015-08-10 DIAGNOSIS — L89312 Pressure ulcer of right buttock, stage 2: Secondary | ICD-10-CM | POA: Diagnosis not present

## 2015-08-10 DIAGNOSIS — F0280 Dementia in other diseases classified elsewhere without behavioral disturbance: Secondary | ICD-10-CM | POA: Diagnosis not present

## 2015-08-10 DIAGNOSIS — G309 Alzheimer's disease, unspecified: Secondary | ICD-10-CM | POA: Diagnosis not present

## 2015-08-10 DIAGNOSIS — M1990 Unspecified osteoarthritis, unspecified site: Secondary | ICD-10-CM | POA: Diagnosis not present

## 2015-08-10 DIAGNOSIS — I129 Hypertensive chronic kidney disease with stage 1 through stage 4 chronic kidney disease, or unspecified chronic kidney disease: Secondary | ICD-10-CM | POA: Diagnosis not present

## 2015-08-10 DIAGNOSIS — G0430 Acute necrotizing hemorrhagic encephalopathy, unspecified: Secondary | ICD-10-CM | POA: Diagnosis not present

## 2015-08-11 DIAGNOSIS — G0430 Acute necrotizing hemorrhagic encephalopathy, unspecified: Secondary | ICD-10-CM | POA: Diagnosis not present

## 2015-08-12 DIAGNOSIS — G0430 Acute necrotizing hemorrhagic encephalopathy, unspecified: Secondary | ICD-10-CM | POA: Diagnosis not present

## 2015-08-13 DIAGNOSIS — F0280 Dementia in other diseases classified elsewhere without behavioral disturbance: Secondary | ICD-10-CM | POA: Diagnosis not present

## 2015-08-13 DIAGNOSIS — G309 Alzheimer's disease, unspecified: Secondary | ICD-10-CM | POA: Diagnosis not present

## 2015-08-13 DIAGNOSIS — G8191 Hemiplegia, unspecified affecting right dominant side: Secondary | ICD-10-CM | POA: Diagnosis not present

## 2015-08-13 DIAGNOSIS — G0430 Acute necrotizing hemorrhagic encephalopathy, unspecified: Secondary | ICD-10-CM | POA: Diagnosis not present

## 2015-08-13 DIAGNOSIS — L89312 Pressure ulcer of right buttock, stage 2: Secondary | ICD-10-CM | POA: Diagnosis not present

## 2015-08-13 DIAGNOSIS — I129 Hypertensive chronic kidney disease with stage 1 through stage 4 chronic kidney disease, or unspecified chronic kidney disease: Secondary | ICD-10-CM | POA: Diagnosis not present

## 2015-08-13 DIAGNOSIS — M1990 Unspecified osteoarthritis, unspecified site: Secondary | ICD-10-CM | POA: Diagnosis not present

## 2015-08-14 DIAGNOSIS — G0430 Acute necrotizing hemorrhagic encephalopathy, unspecified: Secondary | ICD-10-CM | POA: Diagnosis not present

## 2015-08-15 DIAGNOSIS — G0430 Acute necrotizing hemorrhagic encephalopathy, unspecified: Secondary | ICD-10-CM | POA: Diagnosis not present

## 2015-08-16 DIAGNOSIS — I129 Hypertensive chronic kidney disease with stage 1 through stage 4 chronic kidney disease, or unspecified chronic kidney disease: Secondary | ICD-10-CM | POA: Diagnosis not present

## 2015-08-16 DIAGNOSIS — F0280 Dementia in other diseases classified elsewhere without behavioral disturbance: Secondary | ICD-10-CM | POA: Diagnosis not present

## 2015-08-16 DIAGNOSIS — G309 Alzheimer's disease, unspecified: Secondary | ICD-10-CM | POA: Diagnosis not present

## 2015-08-16 DIAGNOSIS — M1990 Unspecified osteoarthritis, unspecified site: Secondary | ICD-10-CM | POA: Diagnosis not present

## 2015-08-16 DIAGNOSIS — G0430 Acute necrotizing hemorrhagic encephalopathy, unspecified: Secondary | ICD-10-CM | POA: Diagnosis not present

## 2015-08-16 DIAGNOSIS — G8191 Hemiplegia, unspecified affecting right dominant side: Secondary | ICD-10-CM | POA: Diagnosis not present

## 2015-08-16 DIAGNOSIS — L89312 Pressure ulcer of right buttock, stage 2: Secondary | ICD-10-CM | POA: Diagnosis not present

## 2015-08-17 DIAGNOSIS — I129 Hypertensive chronic kidney disease with stage 1 through stage 4 chronic kidney disease, or unspecified chronic kidney disease: Secondary | ICD-10-CM | POA: Diagnosis not present

## 2015-08-17 DIAGNOSIS — M1990 Unspecified osteoarthritis, unspecified site: Secondary | ICD-10-CM | POA: Diagnosis not present

## 2015-08-17 DIAGNOSIS — L89312 Pressure ulcer of right buttock, stage 2: Secondary | ICD-10-CM | POA: Diagnosis not present

## 2015-08-17 DIAGNOSIS — F0280 Dementia in other diseases classified elsewhere without behavioral disturbance: Secondary | ICD-10-CM | POA: Diagnosis not present

## 2015-08-17 DIAGNOSIS — G309 Alzheimer's disease, unspecified: Secondary | ICD-10-CM | POA: Diagnosis not present

## 2015-08-17 DIAGNOSIS — G0430 Acute necrotizing hemorrhagic encephalopathy, unspecified: Secondary | ICD-10-CM | POA: Diagnosis not present

## 2015-08-17 DIAGNOSIS — G8191 Hemiplegia, unspecified affecting right dominant side: Secondary | ICD-10-CM | POA: Diagnosis not present

## 2015-08-18 DIAGNOSIS — G0430 Acute necrotizing hemorrhagic encephalopathy, unspecified: Secondary | ICD-10-CM | POA: Diagnosis not present

## 2015-08-19 DIAGNOSIS — G0430 Acute necrotizing hemorrhagic encephalopathy, unspecified: Secondary | ICD-10-CM | POA: Diagnosis not present

## 2015-08-20 DIAGNOSIS — I129 Hypertensive chronic kidney disease with stage 1 through stage 4 chronic kidney disease, or unspecified chronic kidney disease: Secondary | ICD-10-CM | POA: Diagnosis not present

## 2015-08-20 DIAGNOSIS — G309 Alzheimer's disease, unspecified: Secondary | ICD-10-CM | POA: Diagnosis not present

## 2015-08-20 DIAGNOSIS — F0280 Dementia in other diseases classified elsewhere without behavioral disturbance: Secondary | ICD-10-CM | POA: Diagnosis not present

## 2015-08-20 DIAGNOSIS — G8191 Hemiplegia, unspecified affecting right dominant side: Secondary | ICD-10-CM | POA: Diagnosis not present

## 2015-08-20 DIAGNOSIS — M1990 Unspecified osteoarthritis, unspecified site: Secondary | ICD-10-CM | POA: Diagnosis not present

## 2015-08-20 DIAGNOSIS — G0430 Acute necrotizing hemorrhagic encephalopathy, unspecified: Secondary | ICD-10-CM | POA: Diagnosis not present

## 2015-08-20 DIAGNOSIS — L89312 Pressure ulcer of right buttock, stage 2: Secondary | ICD-10-CM | POA: Diagnosis not present

## 2015-08-21 DIAGNOSIS — L89312 Pressure ulcer of right buttock, stage 2: Secondary | ICD-10-CM | POA: Diagnosis not present

## 2015-08-21 DIAGNOSIS — G309 Alzheimer's disease, unspecified: Secondary | ICD-10-CM | POA: Diagnosis not present

## 2015-08-21 DIAGNOSIS — M1990 Unspecified osteoarthritis, unspecified site: Secondary | ICD-10-CM | POA: Diagnosis not present

## 2015-08-21 DIAGNOSIS — F0280 Dementia in other diseases classified elsewhere without behavioral disturbance: Secondary | ICD-10-CM | POA: Diagnosis not present

## 2015-08-21 DIAGNOSIS — G0430 Acute necrotizing hemorrhagic encephalopathy, unspecified: Secondary | ICD-10-CM | POA: Diagnosis not present

## 2015-08-21 DIAGNOSIS — G8191 Hemiplegia, unspecified affecting right dominant side: Secondary | ICD-10-CM | POA: Diagnosis not present

## 2015-08-21 DIAGNOSIS — I129 Hypertensive chronic kidney disease with stage 1 through stage 4 chronic kidney disease, or unspecified chronic kidney disease: Secondary | ICD-10-CM | POA: Diagnosis not present

## 2015-08-22 DIAGNOSIS — G0430 Acute necrotizing hemorrhagic encephalopathy, unspecified: Secondary | ICD-10-CM | POA: Diagnosis not present

## 2015-08-23 DIAGNOSIS — L89312 Pressure ulcer of right buttock, stage 2: Secondary | ICD-10-CM | POA: Diagnosis not present

## 2015-08-23 DIAGNOSIS — M1990 Unspecified osteoarthritis, unspecified site: Secondary | ICD-10-CM | POA: Diagnosis not present

## 2015-08-23 DIAGNOSIS — J301 Allergic rhinitis due to pollen: Secondary | ICD-10-CM | POA: Diagnosis not present

## 2015-08-23 DIAGNOSIS — G8191 Hemiplegia, unspecified affecting right dominant side: Secondary | ICD-10-CM | POA: Diagnosis not present

## 2015-08-23 DIAGNOSIS — G301 Alzheimer's disease with late onset: Secondary | ICD-10-CM | POA: Diagnosis not present

## 2015-08-23 DIAGNOSIS — G309 Alzheimer's disease, unspecified: Secondary | ICD-10-CM | POA: Diagnosis not present

## 2015-08-23 DIAGNOSIS — F0281 Dementia in other diseases classified elsewhere with behavioral disturbance: Secondary | ICD-10-CM | POA: Diagnosis not present

## 2015-08-23 DIAGNOSIS — G0430 Acute necrotizing hemorrhagic encephalopathy, unspecified: Secondary | ICD-10-CM | POA: Diagnosis not present

## 2015-08-23 DIAGNOSIS — I129 Hypertensive chronic kidney disease with stage 1 through stage 4 chronic kidney disease, or unspecified chronic kidney disease: Secondary | ICD-10-CM | POA: Diagnosis not present

## 2015-08-23 DIAGNOSIS — I10 Essential (primary) hypertension: Secondary | ICD-10-CM | POA: Diagnosis not present

## 2015-08-23 DIAGNOSIS — F0280 Dementia in other diseases classified elsewhere without behavioral disturbance: Secondary | ICD-10-CM | POA: Diagnosis not present

## 2015-08-24 DIAGNOSIS — M1990 Unspecified osteoarthritis, unspecified site: Secondary | ICD-10-CM | POA: Diagnosis not present

## 2015-08-24 DIAGNOSIS — I129 Hypertensive chronic kidney disease with stage 1 through stage 4 chronic kidney disease, or unspecified chronic kidney disease: Secondary | ICD-10-CM | POA: Diagnosis not present

## 2015-08-24 DIAGNOSIS — G8191 Hemiplegia, unspecified affecting right dominant side: Secondary | ICD-10-CM | POA: Diagnosis not present

## 2015-08-24 DIAGNOSIS — G309 Alzheimer's disease, unspecified: Secondary | ICD-10-CM | POA: Diagnosis not present

## 2015-08-24 DIAGNOSIS — G0430 Acute necrotizing hemorrhagic encephalopathy, unspecified: Secondary | ICD-10-CM | POA: Diagnosis not present

## 2015-08-24 DIAGNOSIS — L89312 Pressure ulcer of right buttock, stage 2: Secondary | ICD-10-CM | POA: Diagnosis not present

## 2015-08-24 DIAGNOSIS — F0280 Dementia in other diseases classified elsewhere without behavioral disturbance: Secondary | ICD-10-CM | POA: Diagnosis not present

## 2015-08-25 DIAGNOSIS — G0430 Acute necrotizing hemorrhagic encephalopathy, unspecified: Secondary | ICD-10-CM | POA: Diagnosis not present

## 2015-08-26 DIAGNOSIS — G0430 Acute necrotizing hemorrhagic encephalopathy, unspecified: Secondary | ICD-10-CM | POA: Diagnosis not present

## 2015-08-27 DIAGNOSIS — G0430 Acute necrotizing hemorrhagic encephalopathy, unspecified: Secondary | ICD-10-CM | POA: Diagnosis not present

## 2015-08-28 DIAGNOSIS — G8191 Hemiplegia, unspecified affecting right dominant side: Secondary | ICD-10-CM | POA: Diagnosis not present

## 2015-08-28 DIAGNOSIS — L89312 Pressure ulcer of right buttock, stage 2: Secondary | ICD-10-CM | POA: Diagnosis not present

## 2015-08-28 DIAGNOSIS — G0430 Acute necrotizing hemorrhagic encephalopathy, unspecified: Secondary | ICD-10-CM | POA: Diagnosis not present

## 2015-08-28 DIAGNOSIS — F0280 Dementia in other diseases classified elsewhere without behavioral disturbance: Secondary | ICD-10-CM | POA: Diagnosis not present

## 2015-08-28 DIAGNOSIS — G309 Alzheimer's disease, unspecified: Secondary | ICD-10-CM | POA: Diagnosis not present

## 2015-08-28 DIAGNOSIS — M1990 Unspecified osteoarthritis, unspecified site: Secondary | ICD-10-CM | POA: Diagnosis not present

## 2015-08-28 DIAGNOSIS — I129 Hypertensive chronic kidney disease with stage 1 through stage 4 chronic kidney disease, or unspecified chronic kidney disease: Secondary | ICD-10-CM | POA: Diagnosis not present

## 2015-08-29 DIAGNOSIS — G0430 Acute necrotizing hemorrhagic encephalopathy, unspecified: Secondary | ICD-10-CM | POA: Diagnosis not present

## 2015-08-30 DIAGNOSIS — G0430 Acute necrotizing hemorrhagic encephalopathy, unspecified: Secondary | ICD-10-CM | POA: Diagnosis not present

## 2015-08-31 DIAGNOSIS — M1990 Unspecified osteoarthritis, unspecified site: Secondary | ICD-10-CM | POA: Diagnosis not present

## 2015-08-31 DIAGNOSIS — I129 Hypertensive chronic kidney disease with stage 1 through stage 4 chronic kidney disease, or unspecified chronic kidney disease: Secondary | ICD-10-CM | POA: Diagnosis not present

## 2015-08-31 DIAGNOSIS — F0280 Dementia in other diseases classified elsewhere without behavioral disturbance: Secondary | ICD-10-CM | POA: Diagnosis not present

## 2015-08-31 DIAGNOSIS — G0430 Acute necrotizing hemorrhagic encephalopathy, unspecified: Secondary | ICD-10-CM | POA: Diagnosis not present

## 2015-08-31 DIAGNOSIS — G309 Alzheimer's disease, unspecified: Secondary | ICD-10-CM | POA: Diagnosis not present

## 2015-08-31 DIAGNOSIS — L89312 Pressure ulcer of right buttock, stage 2: Secondary | ICD-10-CM | POA: Diagnosis not present

## 2015-08-31 DIAGNOSIS — G8191 Hemiplegia, unspecified affecting right dominant side: Secondary | ICD-10-CM | POA: Diagnosis not present

## 2015-09-01 DIAGNOSIS — G0430 Acute necrotizing hemorrhagic encephalopathy, unspecified: Secondary | ICD-10-CM | POA: Diagnosis not present

## 2015-09-02 DIAGNOSIS — G0430 Acute necrotizing hemorrhagic encephalopathy, unspecified: Secondary | ICD-10-CM | POA: Diagnosis not present

## 2015-09-02 IMAGING — CR DG CHEST 2V
2 series · 2 of 2 positions shown · non-contrast
Comparison: Chest radiograph performed 11/18/2013

CLINICAL DATA: Acute onset altered mental status. Generalized
weakness and confusion. Initial encounter.

EXAM:
CHEST  2 VIEW

[chest lat]
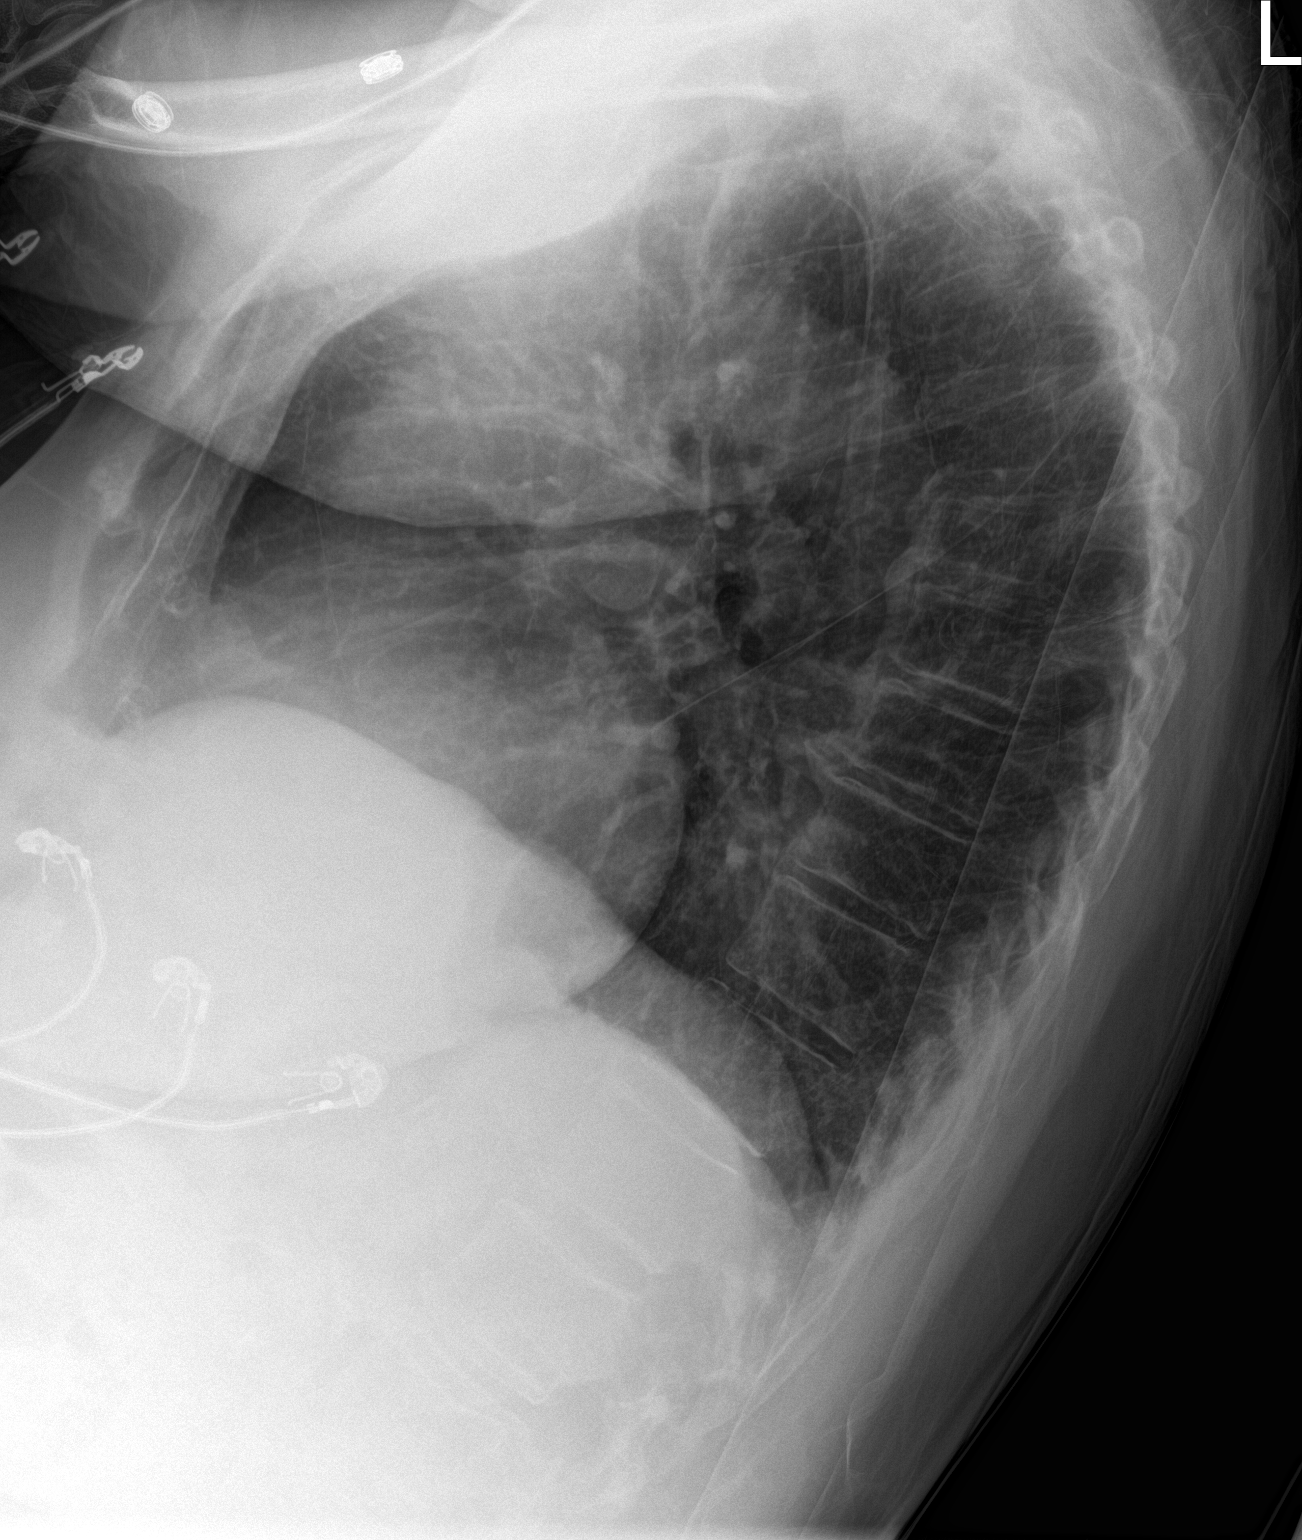

[chest ap]
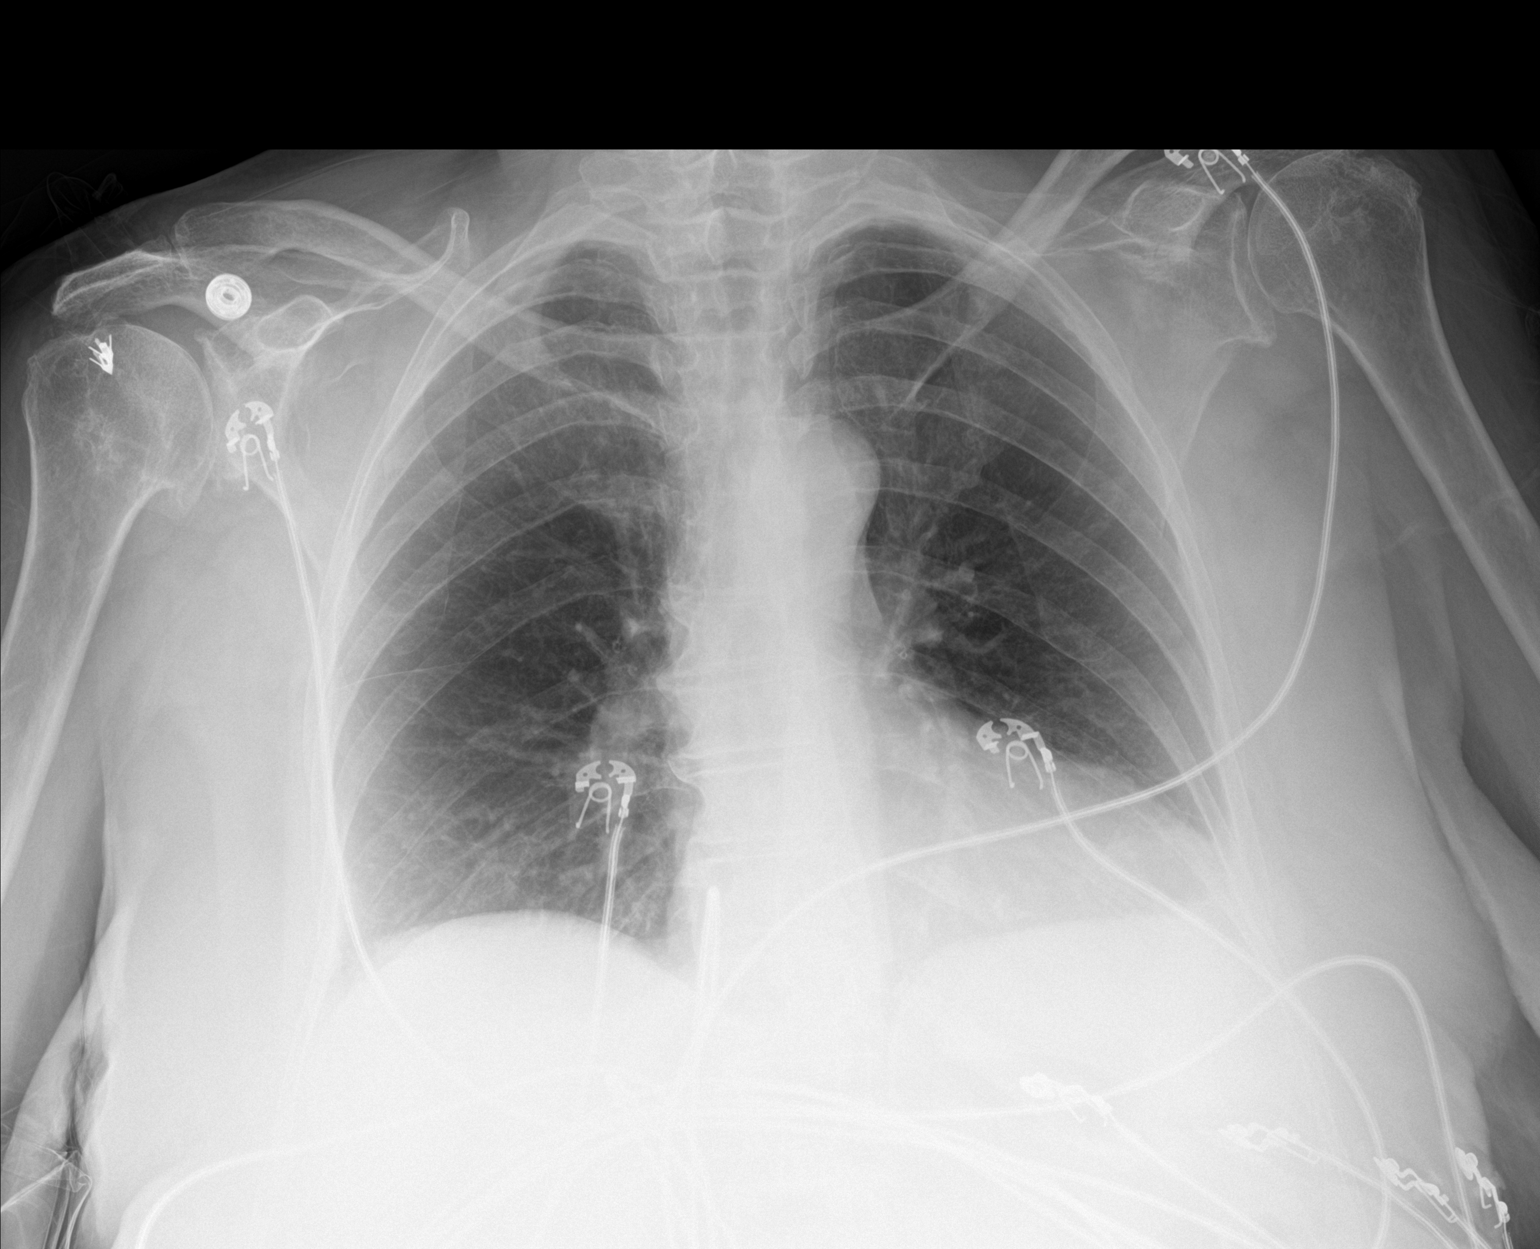

[2 of 2 positions shown; findings below may reference images not displayed]

FINDINGS: The lungs are well-aerated. Blunting of the left costophrenic angle
could reflect a small left pleural effusion, not well characterized
on the lateral view. The lungs are otherwise grossly clear. There is
no evidence of pneumothorax.

The heart is borderline normal in size. No acute osseous
abnormalities are seen. The patient is status post right-sided
rotator cuff repair.
IMPRESSION: Blunting of the left costophrenic angle could reflect a small left
pleural effusion. Lungs otherwise clear.

## 2015-09-03 DIAGNOSIS — G0430 Acute necrotizing hemorrhagic encephalopathy, unspecified: Secondary | ICD-10-CM | POA: Diagnosis not present

## 2015-09-03 IMAGING — CT CT HEAD W/O CM
2 series · 15 of 30 positions shown, 19 images · non-contrast
Comparison: CT of the head performed 11/21/2013

CLINICAL DATA: Acute onset of confusion. Frontal headache. Initial
encounter.

EXAM:
CT HEAD WITHOUT CONTRAST
TECHNIQUE: Contiguous axial images were obtained from the base of the skull
through the vertex without intravenous contrast.

[Series 201: head w/o, idose (1) · axial · non-contrast · 0.49mm/px · z∈[+64,+189]mm · 13 of 31 slices shown, 17 images]
[im 3/31  brain]
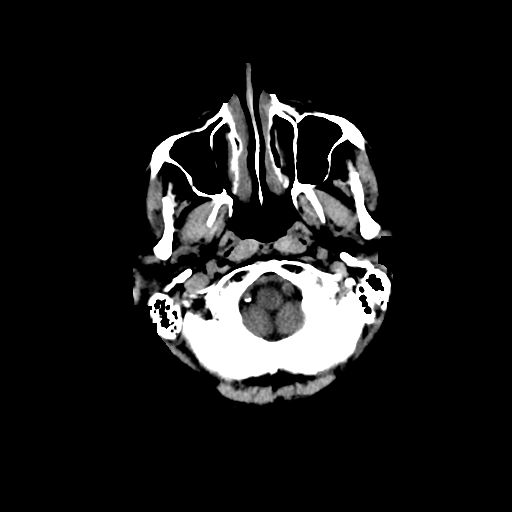
[im 3/31  bone]
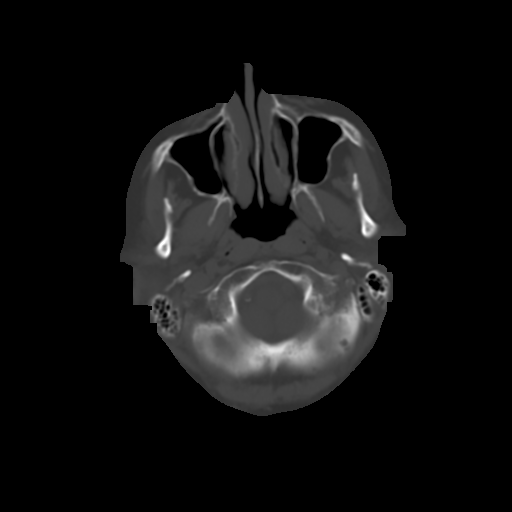
[im 5/31  brain]
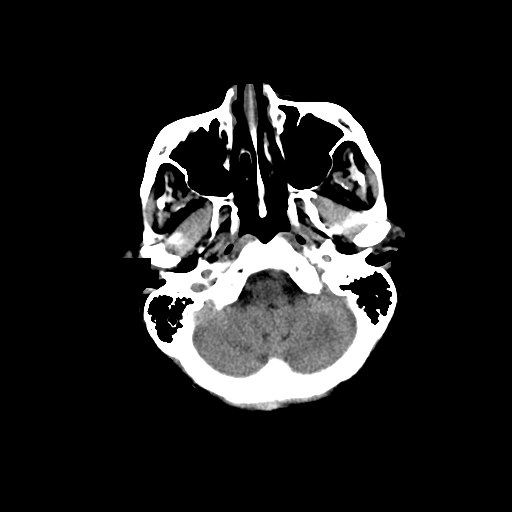
[im 7/31  brain]
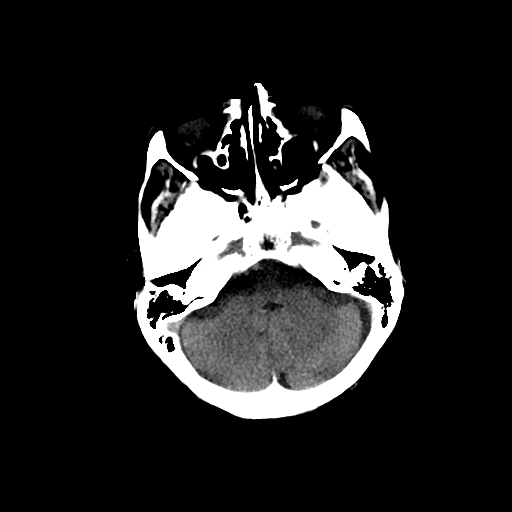
[im 9/31  brain]
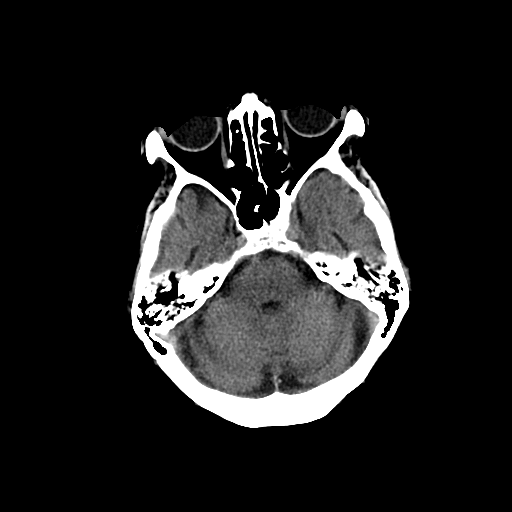
[im 11/31  brain]
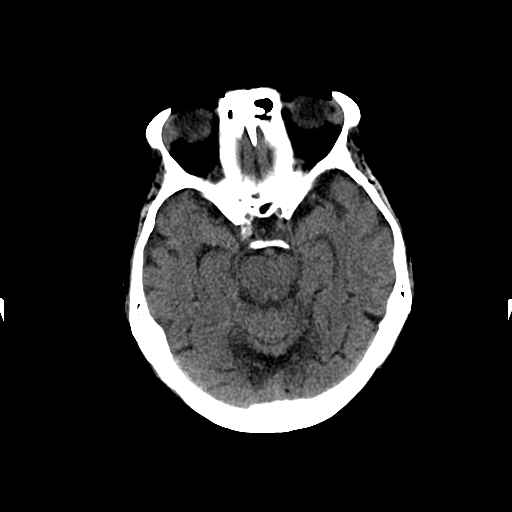
[im 11/31  bone]
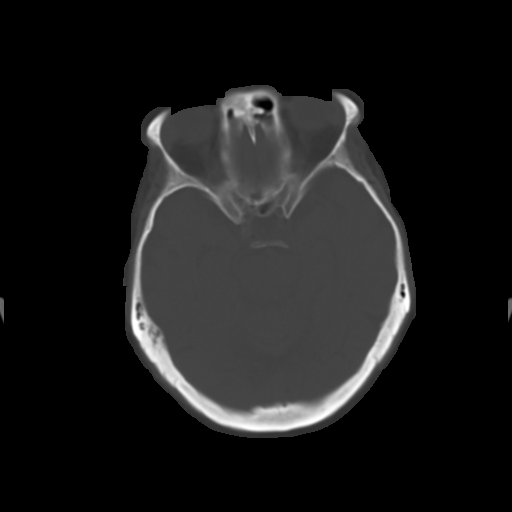
[im 13/31  brain]
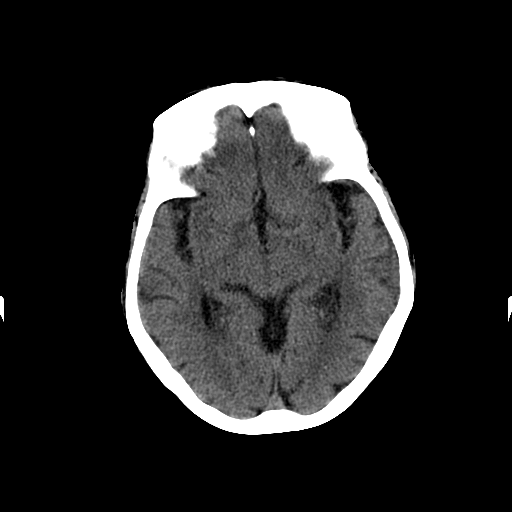
[im 16/31  brain]
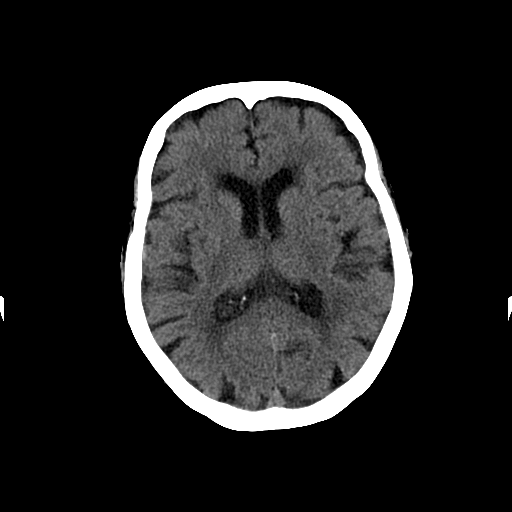
[im 18/31  brain]
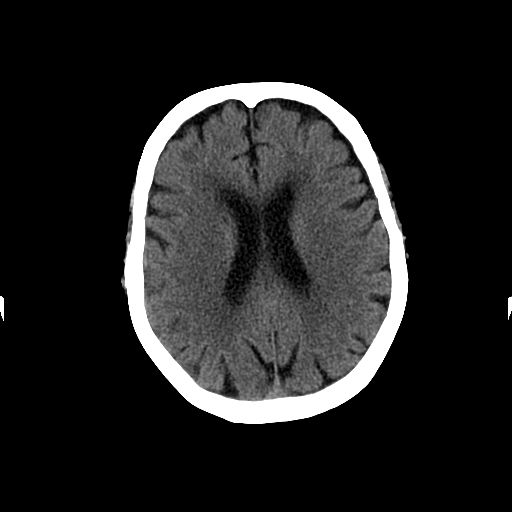
[im 20/31  brain]
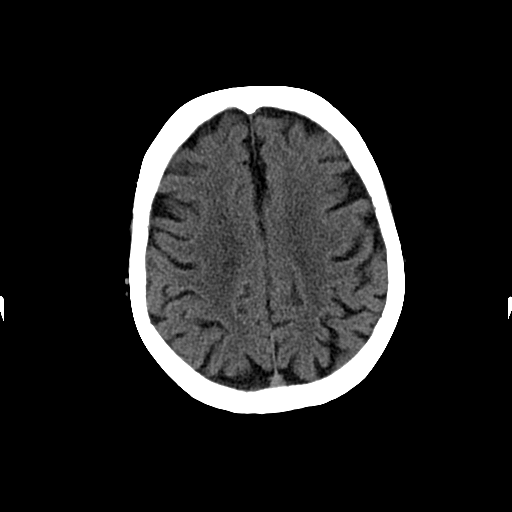
[im 20/31  bone]
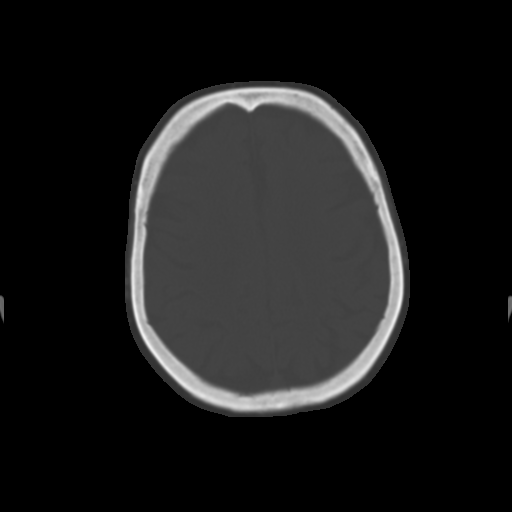
[im 22/31  brain]
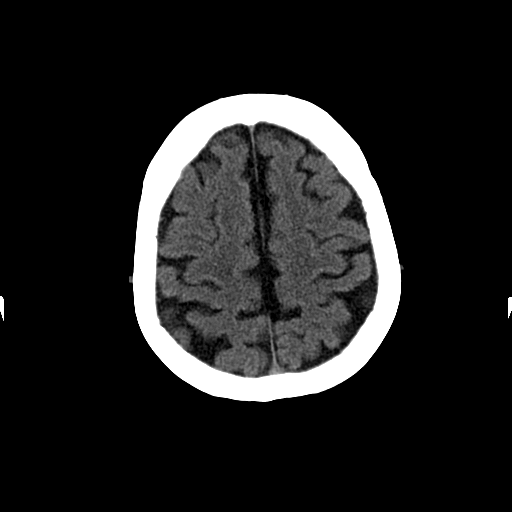
[im 24/31  brain]
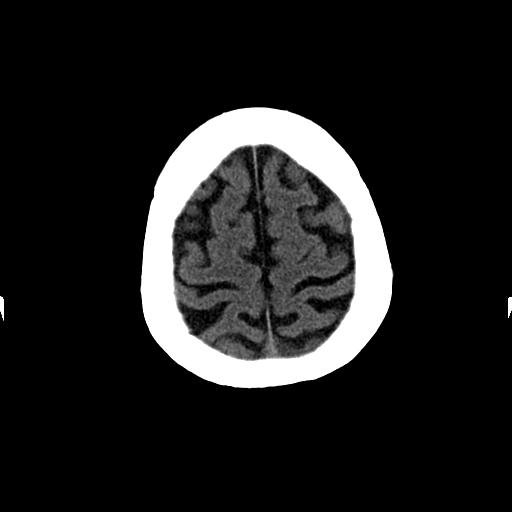
[im 26/31  brain]
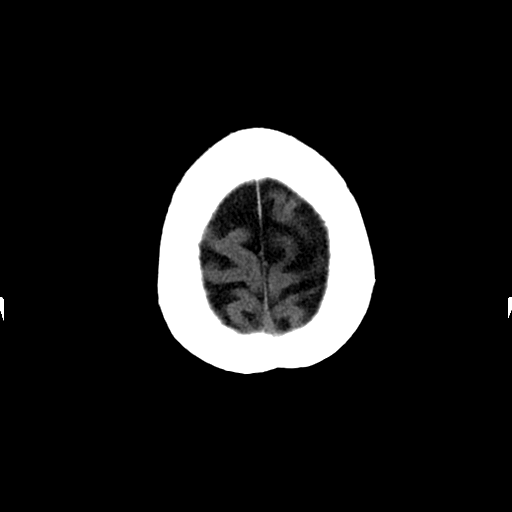
[im 28/31  brain]
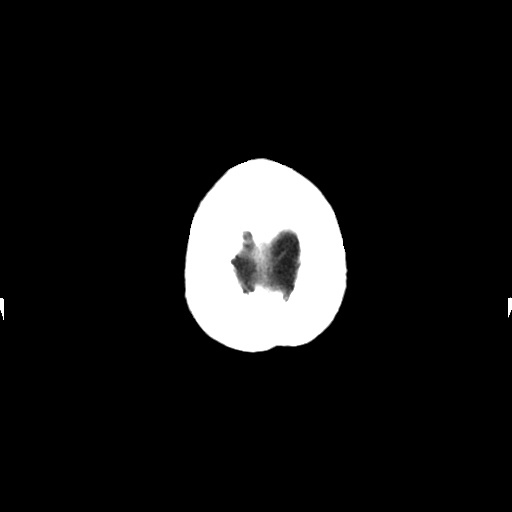
[im 28/31  bone]
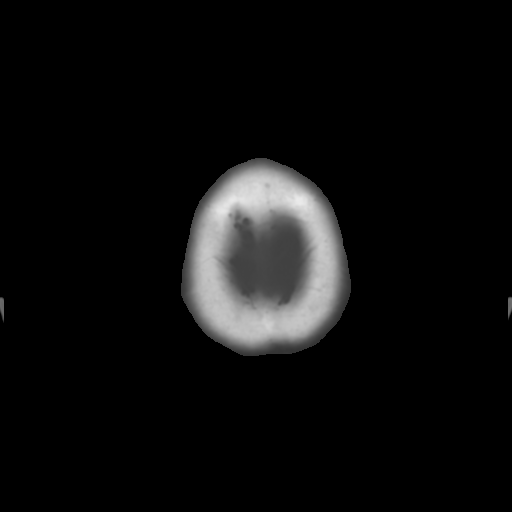

[Series 202: head w/o bone, idose (1) · axial · non-contrast · 0.49mm/px · z∈[+64,+84]mm · 2 of 31 slices shown]
[im 3/31  bone]
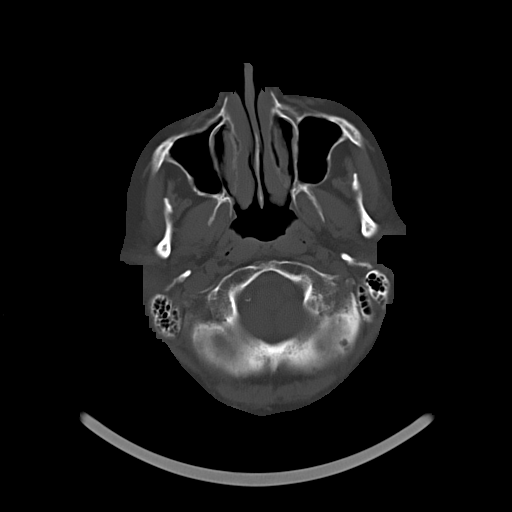
[im 7/31  bone]
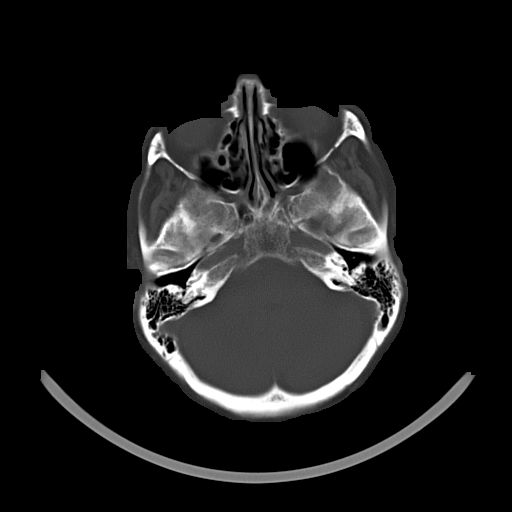

[15 of 30 positions shown; findings below may reference images not displayed]

FINDINGS: There is no evidence of acute infarction, mass lesion, or intra- or
extra-axial hemorrhage on CT.

Prominence of the ventricles and sulci reflects mild cortical volume
loss. Mild cerebellar atrophy is noted. Scattered periventricular
and subcortical white matter change likely reflects small vessel
ischemic microangiopathy.

The brainstem and fourth ventricle are within normal limits. The
basal ganglia are unremarkable in appearance. The cerebral
hemispheres demonstrate grossly normal gray-white differentiation.
No mass effect or midline shift is seen.

There is no evidence of fracture; visualized osseous structures are
unremarkable in appearance. The orbits are within normal limits. The
paranasal sinuses and mastoid air cells are well-aerated. No
significant soft tissue abnormalities are seen.
IMPRESSION: 1. No acute intracranial pathology seen on CT.
2. Mild cortical volume loss and scattered small vessel ischemic
microangiopathy.

## 2015-09-04 DIAGNOSIS — I129 Hypertensive chronic kidney disease with stage 1 through stage 4 chronic kidney disease, or unspecified chronic kidney disease: Secondary | ICD-10-CM | POA: Diagnosis not present

## 2015-09-04 DIAGNOSIS — M24541 Contracture, right hand: Secondary | ICD-10-CM | POA: Diagnosis not present

## 2015-09-04 DIAGNOSIS — L89312 Pressure ulcer of right buttock, stage 2: Secondary | ICD-10-CM | POA: Diagnosis not present

## 2015-09-04 DIAGNOSIS — G0430 Acute necrotizing hemorrhagic encephalopathy, unspecified: Secondary | ICD-10-CM | POA: Diagnosis not present

## 2015-09-04 DIAGNOSIS — G8191 Hemiplegia, unspecified affecting right dominant side: Secondary | ICD-10-CM | POA: Diagnosis not present

## 2015-09-04 DIAGNOSIS — F0280 Dementia in other diseases classified elsewhere without behavioral disturbance: Secondary | ICD-10-CM | POA: Diagnosis not present

## 2015-09-04 DIAGNOSIS — G309 Alzheimer's disease, unspecified: Secondary | ICD-10-CM | POA: Diagnosis not present

## 2015-09-04 DIAGNOSIS — M1990 Unspecified osteoarthritis, unspecified site: Secondary | ICD-10-CM | POA: Diagnosis not present

## 2015-09-05 DIAGNOSIS — G0430 Acute necrotizing hemorrhagic encephalopathy, unspecified: Secondary | ICD-10-CM | POA: Diagnosis not present

## 2015-09-05 DIAGNOSIS — M24541 Contracture, right hand: Secondary | ICD-10-CM | POA: Diagnosis not present

## 2015-09-06 DIAGNOSIS — G309 Alzheimer's disease, unspecified: Secondary | ICD-10-CM | POA: Diagnosis not present

## 2015-09-06 DIAGNOSIS — L89152 Pressure ulcer of sacral region, stage 2: Secondary | ICD-10-CM | POA: Diagnosis not present

## 2015-09-06 DIAGNOSIS — F0280 Dementia in other diseases classified elsewhere without behavioral disturbance: Secondary | ICD-10-CM | POA: Diagnosis not present

## 2015-09-06 DIAGNOSIS — M150 Primary generalized (osteo)arthritis: Secondary | ICD-10-CM | POA: Diagnosis not present

## 2015-09-06 DIAGNOSIS — G8191 Hemiplegia, unspecified affecting right dominant side: Secondary | ICD-10-CM | POA: Diagnosis not present

## 2015-09-06 DIAGNOSIS — R32 Unspecified urinary incontinence: Secondary | ICD-10-CM | POA: Diagnosis not present

## 2015-09-06 DIAGNOSIS — G0430 Acute necrotizing hemorrhagic encephalopathy, unspecified: Secondary | ICD-10-CM | POA: Diagnosis not present

## 2015-09-06 DIAGNOSIS — I129 Hypertensive chronic kidney disease with stage 1 through stage 4 chronic kidney disease, or unspecified chronic kidney disease: Secondary | ICD-10-CM | POA: Diagnosis not present

## 2015-09-07 DIAGNOSIS — M25569 Pain in unspecified knee: Secondary | ICD-10-CM | POA: Diagnosis not present

## 2015-09-07 DIAGNOSIS — G0430 Acute necrotizing hemorrhagic encephalopathy, unspecified: Secondary | ICD-10-CM | POA: Diagnosis not present

## 2015-09-08 DIAGNOSIS — G0430 Acute necrotizing hemorrhagic encephalopathy, unspecified: Secondary | ICD-10-CM | POA: Diagnosis not present

## 2015-09-09 DIAGNOSIS — G0430 Acute necrotizing hemorrhagic encephalopathy, unspecified: Secondary | ICD-10-CM | POA: Diagnosis not present

## 2015-09-10 DIAGNOSIS — G0430 Acute necrotizing hemorrhagic encephalopathy, unspecified: Secondary | ICD-10-CM | POA: Diagnosis not present

## 2015-09-10 DIAGNOSIS — F0281 Dementia in other diseases classified elsewhere with behavioral disturbance: Secondary | ICD-10-CM | POA: Diagnosis not present

## 2015-09-10 DIAGNOSIS — M1711 Unilateral primary osteoarthritis, right knee: Secondary | ICD-10-CM | POA: Diagnosis not present

## 2015-09-10 DIAGNOSIS — W19XXXD Unspecified fall, subsequent encounter: Secondary | ICD-10-CM | POA: Diagnosis not present

## 2015-09-10 DIAGNOSIS — M25561 Pain in right knee: Secondary | ICD-10-CM | POA: Diagnosis not present

## 2015-09-10 DIAGNOSIS — G301 Alzheimer's disease with late onset: Secondary | ICD-10-CM | POA: Diagnosis not present

## 2015-09-11 DIAGNOSIS — G309 Alzheimer's disease, unspecified: Secondary | ICD-10-CM | POA: Diagnosis not present

## 2015-09-11 DIAGNOSIS — G8191 Hemiplegia, unspecified affecting right dominant side: Secondary | ICD-10-CM | POA: Diagnosis not present

## 2015-09-11 DIAGNOSIS — I129 Hypertensive chronic kidney disease with stage 1 through stage 4 chronic kidney disease, or unspecified chronic kidney disease: Secondary | ICD-10-CM | POA: Diagnosis not present

## 2015-09-11 DIAGNOSIS — M150 Primary generalized (osteo)arthritis: Secondary | ICD-10-CM | POA: Diagnosis not present

## 2015-09-11 DIAGNOSIS — G0430 Acute necrotizing hemorrhagic encephalopathy, unspecified: Secondary | ICD-10-CM | POA: Diagnosis not present

## 2015-09-11 DIAGNOSIS — F0280 Dementia in other diseases classified elsewhere without behavioral disturbance: Secondary | ICD-10-CM | POA: Diagnosis not present

## 2015-09-11 DIAGNOSIS — L89152 Pressure ulcer of sacral region, stage 2: Secondary | ICD-10-CM | POA: Diagnosis not present

## 2015-09-12 DIAGNOSIS — G0430 Acute necrotizing hemorrhagic encephalopathy, unspecified: Secondary | ICD-10-CM | POA: Diagnosis not present

## 2015-09-13 DIAGNOSIS — G0430 Acute necrotizing hemorrhagic encephalopathy, unspecified: Secondary | ICD-10-CM | POA: Diagnosis not present

## 2015-09-14 DIAGNOSIS — G8191 Hemiplegia, unspecified affecting right dominant side: Secondary | ICD-10-CM | POA: Diagnosis not present

## 2015-09-14 DIAGNOSIS — G309 Alzheimer's disease, unspecified: Secondary | ICD-10-CM | POA: Diagnosis not present

## 2015-09-14 DIAGNOSIS — L89152 Pressure ulcer of sacral region, stage 2: Secondary | ICD-10-CM | POA: Diagnosis not present

## 2015-09-14 DIAGNOSIS — M150 Primary generalized (osteo)arthritis: Secondary | ICD-10-CM | POA: Diagnosis not present

## 2015-09-14 DIAGNOSIS — G0430 Acute necrotizing hemorrhagic encephalopathy, unspecified: Secondary | ICD-10-CM | POA: Diagnosis not present

## 2015-09-14 DIAGNOSIS — I129 Hypertensive chronic kidney disease with stage 1 through stage 4 chronic kidney disease, or unspecified chronic kidney disease: Secondary | ICD-10-CM | POA: Diagnosis not present

## 2015-09-14 DIAGNOSIS — F0280 Dementia in other diseases classified elsewhere without behavioral disturbance: Secondary | ICD-10-CM | POA: Diagnosis not present

## 2015-09-15 DIAGNOSIS — G0430 Acute necrotizing hemorrhagic encephalopathy, unspecified: Secondary | ICD-10-CM | POA: Diagnosis not present

## 2015-09-16 DIAGNOSIS — G0430 Acute necrotizing hemorrhagic encephalopathy, unspecified: Secondary | ICD-10-CM | POA: Diagnosis not present

## 2015-09-17 DIAGNOSIS — G0430 Acute necrotizing hemorrhagic encephalopathy, unspecified: Secondary | ICD-10-CM | POA: Diagnosis not present

## 2015-09-18 DIAGNOSIS — F411 Generalized anxiety disorder: Secondary | ICD-10-CM | POA: Diagnosis not present

## 2015-09-18 DIAGNOSIS — G0430 Acute necrotizing hemorrhagic encephalopathy, unspecified: Secondary | ICD-10-CM | POA: Diagnosis not present

## 2015-09-18 DIAGNOSIS — G309 Alzheimer's disease, unspecified: Secondary | ICD-10-CM | POA: Diagnosis not present

## 2015-09-18 DIAGNOSIS — F0281 Dementia in other diseases classified elsewhere with behavioral disturbance: Secondary | ICD-10-CM | POA: Diagnosis not present

## 2015-09-19 ENCOUNTER — Emergency Department (HOSPITAL_COMMUNITY): Payer: Commercial Managed Care - HMO

## 2015-09-19 ENCOUNTER — Encounter (HOSPITAL_COMMUNITY): Payer: Self-pay | Admitting: Emergency Medicine

## 2015-09-19 ENCOUNTER — Inpatient Hospital Stay (HOSPITAL_COMMUNITY)
Admission: EM | Admit: 2015-09-19 | Discharge: 2015-09-23 | DRG: 470 | Disposition: A | Discharge status: Skilled Nursing Facility | Payer: Commercial Managed Care - HMO | Attending: Internal Medicine | Admitting: Internal Medicine

## 2015-09-19 DIAGNOSIS — Z66 Do not resuscitate: Secondary | ICD-10-CM | POA: Diagnosis not present

## 2015-09-19 DIAGNOSIS — F0391 Unspecified dementia with behavioral disturbance: Secondary | ICD-10-CM | POA: Diagnosis not present

## 2015-09-19 DIAGNOSIS — L8990 Pressure ulcer of unspecified site, unspecified stage: Secondary | ICD-10-CM | POA: Insufficient documentation

## 2015-09-19 DIAGNOSIS — Z7982 Long term (current) use of aspirin: Secondary | ICD-10-CM

## 2015-09-19 DIAGNOSIS — Z8249 Family history of ischemic heart disease and other diseases of the circulatory system: Secondary | ICD-10-CM

## 2015-09-19 DIAGNOSIS — Z01811 Encounter for preprocedural respiratory examination: Secondary | ICD-10-CM

## 2015-09-19 DIAGNOSIS — Y92129 Unspecified place in nursing home as the place of occurrence of the external cause: Secondary | ICD-10-CM | POA: Diagnosis not present

## 2015-09-19 DIAGNOSIS — Z471 Aftercare following joint replacement surgery: Secondary | ICD-10-CM | POA: Diagnosis not present

## 2015-09-19 DIAGNOSIS — S72001A Fracture of unspecified part of neck of right femur, initial encounter for closed fracture: Secondary | ICD-10-CM | POA: Diagnosis not present

## 2015-09-19 DIAGNOSIS — F419 Anxiety disorder, unspecified: Secondary | ICD-10-CM | POA: Diagnosis not present

## 2015-09-19 DIAGNOSIS — Z96641 Presence of right artificial hip joint: Secondary | ICD-10-CM | POA: Diagnosis not present

## 2015-09-19 DIAGNOSIS — Z87891 Personal history of nicotine dependence: Secondary | ICD-10-CM | POA: Diagnosis not present

## 2015-09-19 DIAGNOSIS — D62 Acute posthemorrhagic anemia: Secondary | ICD-10-CM | POA: Diagnosis not present

## 2015-09-19 DIAGNOSIS — I129 Hypertensive chronic kidney disease with stage 1 through stage 4 chronic kidney disease, or unspecified chronic kidney disease: Secondary | ICD-10-CM | POA: Diagnosis present

## 2015-09-19 DIAGNOSIS — S8991XA Unspecified injury of right lower leg, initial encounter: Secondary | ICD-10-CM | POA: Diagnosis not present

## 2015-09-19 DIAGNOSIS — D649 Anemia, unspecified: Secondary | ICD-10-CM | POA: Diagnosis not present

## 2015-09-19 DIAGNOSIS — Z418 Encounter for other procedures for purposes other than remedying health state: Secondary | ICD-10-CM

## 2015-09-19 DIAGNOSIS — I10 Essential (primary) hypertension: Secondary | ICD-10-CM | POA: Diagnosis present

## 2015-09-19 DIAGNOSIS — F339 Major depressive disorder, recurrent, unspecified: Secondary | ICD-10-CM | POA: Diagnosis not present

## 2015-09-19 DIAGNOSIS — F0390 Unspecified dementia without behavioral disturbance: Secondary | ICD-10-CM | POA: Diagnosis not present

## 2015-09-19 DIAGNOSIS — E039 Hypothyroidism, unspecified: Secondary | ICD-10-CM | POA: Diagnosis present

## 2015-09-19 DIAGNOSIS — S72041A Displaced fracture of base of neck of right femur, initial encounter for closed fracture: Secondary | ICD-10-CM | POA: Diagnosis not present

## 2015-09-19 DIAGNOSIS — N189 Chronic kidney disease, unspecified: Secondary | ICD-10-CM | POA: Diagnosis present

## 2015-09-19 DIAGNOSIS — W19XXXA Unspecified fall, initial encounter: Secondary | ICD-10-CM | POA: Diagnosis present

## 2015-09-19 DIAGNOSIS — R451 Restlessness and agitation: Secondary | ICD-10-CM | POA: Diagnosis not present

## 2015-09-19 DIAGNOSIS — Z09 Encounter for follow-up examination after completed treatment for conditions other than malignant neoplasm: Secondary | ICD-10-CM

## 2015-09-19 DIAGNOSIS — Z419 Encounter for procedure for purposes other than remedying health state, unspecified: Secondary | ICD-10-CM

## 2015-09-19 DIAGNOSIS — Z01818 Encounter for other preprocedural examination: Secondary | ICD-10-CM | POA: Diagnosis not present

## 2015-09-19 DIAGNOSIS — S72011A Unspecified intracapsular fracture of right femur, initial encounter for closed fracture: Principal | ICD-10-CM | POA: Diagnosis present

## 2015-09-19 DIAGNOSIS — R509 Fever, unspecified: Secondary | ICD-10-CM | POA: Diagnosis not present

## 2015-09-19 DIAGNOSIS — S72001D Fracture of unspecified part of neck of right femur, subsequent encounter for closed fracture with routine healing: Secondary | ICD-10-CM | POA: Diagnosis not present

## 2015-09-19 DIAGNOSIS — M25561 Pain in right knee: Secondary | ICD-10-CM | POA: Diagnosis not present

## 2015-09-19 DIAGNOSIS — Z9660 Presence of unspecified orthopedic joint implant: Secondary | ICD-10-CM | POA: Diagnosis not present

## 2015-09-19 DIAGNOSIS — M25551 Pain in right hip: Secondary | ICD-10-CM | POA: Diagnosis present

## 2015-09-19 DIAGNOSIS — S72009A Fracture of unspecified part of neck of unspecified femur, initial encounter for closed fracture: Secondary | ICD-10-CM | POA: Diagnosis not present

## 2015-09-19 DIAGNOSIS — M6281 Muscle weakness (generalized): Secondary | ICD-10-CM | POA: Diagnosis not present

## 2015-09-19 LAB — CBC WITH DIFFERENTIAL/PLATELET
BASOS PCT: 0 %
Basophils Absolute: 0 10*3/uL (ref 0.0–0.1)
Eosinophils Absolute: 0.1 10*3/uL (ref 0.0–0.7)
Eosinophils Relative: 0 %
HEMATOCRIT: 35.2 % — AB (ref 36.0–46.0)
Hemoglobin: 11.9 g/dL — ABNORMAL LOW (ref 12.0–15.0)
LYMPHS ABS: 1.9 10*3/uL (ref 0.7–4.0)
Lymphocytes Relative: 12 %
MCH: 28.7 pg (ref 26.0–34.0)
MCHC: 33.8 g/dL (ref 30.0–36.0)
MCV: 84.8 fL (ref 78.0–100.0)
MONO ABS: 1 10*3/uL (ref 0.1–1.0)
MONOS PCT: 6 %
NEUTROS ABS: 13.6 10*3/uL — AB (ref 1.7–7.7)
Neutrophils Relative %: 82 %
Platelets: 339 10*3/uL (ref 150–400)
RBC: 4.15 MIL/uL (ref 3.87–5.11)
RDW: 13.1 % (ref 11.5–15.5)
WBC: 16.6 10*3/uL — ABNORMAL HIGH (ref 4.0–10.5)

## 2015-09-19 LAB — BASIC METABOLIC PANEL
Anion gap: 11 (ref 5–15)
BUN: 22 mg/dL — AB (ref 6–20)
CALCIUM: 9.3 mg/dL (ref 8.9–10.3)
CO2: 27 mmol/L (ref 22–32)
CREATININE: 0.75 mg/dL (ref 0.44–1.00)
Chloride: 102 mmol/L (ref 101–111)
GFR calc non Af Amer: >60 mL/min (ref >60)
GLUCOSE: 118 mg/dL — AB (ref 65–99)
Potassium: 4.4 mmol/L (ref 3.5–5.1)
Sodium: 140 mmol/L (ref 135–145)

## 2015-09-19 NOTE — ED Provider Notes (Signed)
11:35 PM Son, Valera CastleCharles Blake, notified of admission. He can be reached at [phone]  [facility] Center[facility] Nicloe Frontera, PA-C 09/19/15 2336  Loren Raceravid Yelverton, MD 09/22/15 [phone]

## 2015-09-19 NOTE — ED Notes (Signed)
Patient transported to CT 

## 2015-09-19 NOTE — ED Notes (Signed)
[facility] for hip pain x3 days; staff denies any falls in the past month; pt has fever of 100.6 per EMS; right hip has minor swelling and tenderness; 50mcg fentanyl given en route; pt sleeping on arrival; Vital Signs WDL

## 2015-09-19 NOTE — ED Notes (Signed)
Pt has IV, RN sts she will attempt from there. Will notify phelbotomy if unsuccessful.

## 2015-09-19 NOTE — ED Provider Notes (Signed)
CSN: [id number]     Arrival date & time 09/19/15  2018 History   First MD Initiated Contact with Patient 09/19/15 2127     Chief Complaint  Patient presents with  . Hip Pain    Level V caveat secondary to dementia  (Consider location/radiation/quality/duration/timing/severity/associated sxs/prior Treatment) HPI Comments: Patient is a 82 year old female with a history of arthritis, hypertension, valvular heart disease, dysrhythmia, and chronic kidney disease who presents [facility] the emergency department [facility] for evaluation of 3 days of right lower extremity pain. Staff of the facility deny any falls in the past month. Patient also reportedly had a fever of 100.60F with EMS. She is afebrile on arrival to the emergency department. Patient was given 50g of fentanyl en route by EMS for pain control. Patient does complain of right hip pain with movement and palpation. No chronic steroids or blood thinners on medication list  Patient is a 82 y.o. female presenting with hip pain. The history is provided by the patient and the nursing home. No language interpreter was used.  Hip Pain Associated symptoms include arthralgias.    Past Medical History  Diagnosis Date  . Arthritis   . Chicken pox   . Chronic kidney disease   . Colon polyps   . Hypertension   . Dementia   . Angina   . Dysrhythmia   . Heart murmur   . Shortness of breath   . Recurrent upper respiratory infection (URI)   . Pneumonia   . Hypothyroidism   . Anemia   . Headache(784.0)   . Anxiety   . Depression   . Valvular heart disease 09/04/2012  . Left leg pain 10/02/2012  . UTI (urinary tract infection) 12/30/2012  . Acute bronchitis 04/10/2013  . Dehydration 08/31/2013  . Arthritis 10/02/2012  . Asthma    Past Surgical History  Procedure Laterality Date  . Appendectomy    . Cholecystectomy    . Kidney stones    . Tonsillectomy    . Abdominal hysterectomy    . Hand surgery    . Feet surgery      . Shoulder arthroscopy    . Back surgery    . Tonsillectomy    . Eye surgery      cataract removed and eye lids lifted  . Fracture surgery      bilateral arms  . Tubal ligation    . Colonoscopy w/ polypectomy     Family History  Problem Relation Age of Onset  . Heart disease Mother   . Heart disease Father   . Heart disease Other   . Birth defects Other    Social History  Substance Use Topics  . Smoking status: Former Smoker -- 0.20 packs/day for 1 years    Types: Cigarettes    Quit date: 06/02/1941  . Smokeless tobacco: Never Used  . Alcohol Use: No   OB History    No data available      Review of Systems  Unable to perform ROS: Dementia  Musculoskeletal: Positive for arthralgias.    Allergies  Morphine and related; Codeine; and Hydrocodone  Home Medications   Prior to Admission medications   Medication Sig Start Date End Date Taking? Authorizing Provider  acetaminophen (TYLENOL) 500 MG tablet Take 500 mg by mouth every 4 (four) hours as needed for mild pain, moderate pain or fever.   Yes Historical Provider, MD  alum & mag hydroxide-simeth (MINTOX) 200-200-20 MG/5ML suspension Take 30 mLs  by mouth every 6 (six) hours as needed for indigestion or heartburn.   Yes Historical Provider, MD  aspirin 81 MG chewable tablet Chew 81 mg by mouth daily.   Yes Historical Provider, MD  clonazePAM (KLONOPIN) 0.5 MG tablet Take 1 tablet (0.5 mg total) by mouth every 4 (four) hours as needed for anxiety. Patient taking differently: Take 0.5 mg by mouth 2 (two) times daily.  06/28/15  Yes Dorothea Ogle, MD  clonazePAM (KLONOPIN) 0.5 MG tablet Take 0.5 mg by mouth every 6 (six) hours as needed for anxiety.   Yes Historical Provider, MD  escitalopram (LEXAPRO) 5 MG tablet Take 5 mg by mouth daily.   Yes Historical Provider, MD  guaifenesin (ROBITUSSIN) 100 MG/5ML syrup Take 200 mg by mouth 2 (two) times daily as needed for cough.   Yes Historical Provider, MD  levothyroxine  (SYNTHROID, LEVOTHROID) 50 MCG tablet TAKE 1 TABLET (50 MCG TOTAL) BY MOUTH DAILY. Patient taking differently: Take 1 tablet (50 mcg total) by mouth daily. 09/25/14  Yes Myrlene Broker, MD  loperamide (IMODIUM) 2 MG capsule Take 2 mg by mouth 4 (four) times daily as needed for diarrhea or loose stools.   Yes Historical Provider, MD  magnesium hydroxide (MILK OF MAGNESIA) 400 MG/5ML suspension Take 30 mLs by mouth at bedtime as needed for mild constipation.   Yes Historical Provider, MD  neomycin-bacitracin-polymyxin (NEOSPORIN) ointment Apply 1 application topically daily as needed for wound care.    Yes Historical Provider, MD  PRESCRIPTION MEDICATION Take 1 each by mouth 3 (three) times daily. Mighty Shakes   Yes Historical Provider, MD  risperiDONE (RISPERDAL) 0.5 MG tablet Take 0.5 mg by mouth at bedtime.   Yes Historical Provider, MD  rivastigmine (EXELON) 3 MG capsule Take 3 mg by mouth 2 (two) times daily.   Yes Historical Provider, MD  senna-docusate (SENEXON-S) 8.6-50 MG tablet Take 1 tablet by mouth 2 (two) times daily.   Yes Historical Provider, MD  traMADol (ULTRAM) 50 MG tablet Take 50 mg by mouth 2 (two) times daily.   Yes Historical Provider, MD  traZODone (DESYREL) 50 MG tablet Take 50 mg by mouth at bedtime as needed for sleep.   Yes Historical Provider, MD  albuterol (PROVENTIL HFA;VENTOLIN HFA) 108 (90 BASE) MCG/ACT inhaler Inhale 2 puffs into the lungs every 6 (six) hours as needed for wheezing or shortness of breath. Patient not taking: Reported on 09/19/2015 09/23/13   Sandford Craze, NP   BP 141/52 mmHg  Pulse 71  Temp(Src) 99.6 F (37.6 C) (Oral)  Resp 18  SpO2 93%   Physical Exam  Constitutional: She is oriented to person, place, and time. She appears well-developed and well-nourished. No distress.  Patient in no acute distress. Sleeping on initial presentation.  HENT:  Head: Normocephalic and atraumatic.  Eyes: Conjunctivae and EOM are normal. No scleral  icterus.  Neck: Normal range of motion.  Cardiovascular: Normal rate, regular rhythm and intact distal pulses.   DP and PT pulses 2+ in the right lower extremity  Pulmonary/Chest: Effort normal. No respiratory distress.  Respirations even and unlabored  Musculoskeletal:       Right hip: She exhibits decreased range of motion, tenderness, bony tenderness and deformity. She exhibits no crepitus.       Legs: Pain with movement of the RLE at the R hip. Leg shortening and external rotation appreciated to the RLE.  Neurological: She is alert and oriented to person, place, and time. She exhibits normal muscle  tone. Coordination normal.  Sensation to the RLE intact. Patient able to wiggle all toes of the R foot.  Skin: Skin is warm and dry. No rash noted. She is not diaphoretic. No erythema. No pallor.  Psychiatric: She has a normal mood and affect. Her behavior is normal.  Nursing note and vitals reviewed.   ED Course  Procedures (including critical care time) Labs Review Labs Reviewed  CBC WITH DIFFERENTIAL/PLATELET - Abnormal; Notable for the following:    WBC 16.6 (*)    Hemoglobin 11.9 (*)    HCT 35.2 (*)    Neutro Abs 13.6 (*)    All other components within normal limits  BASIC METABOLIC PANEL - Abnormal; Notable for the following:    Glucose, Bld 118 (*)    BUN 22 (*)    All other components within normal limits    Imaging Review Dg Hip Unilat With Pelvis 2-3 Views Right  09/19/2015  CLINICAL DATA:  Dementia. Right hip pain, 3 days duration. No known injury. EXAM: DG HIP (WITH OR WITHOUT PELVIS) 2-3V RIGHT COMPARISON:  02/06/2015 FINDINGS: Complete femoral neck fracture on the right with displacement of 1 cm. No other pelvic fracture. Left hip appears normal. IMPRESSION: Complete femoral neck fracture on the right, displaced 1 cm. Electronically Signed   By: Paulina Fusi M.D.   On: 09/19/2015 22:15     I have personally reviewed and evaluated these images and lab results as part  of my medical decision-making.   EKG Interpretation None      MDM   Final diagnoses:  Displaced fracture of right femoral neck, closed, initial encounter [facility])    82 year old female presents to the emergency department for evaluation of persistent right hip pain 3 days. [facility] reports no recent falls. Patient is neurovascularly intact. She has evidence of a complete femoral neck fracture on the right with 1 cm displacement. Dr. Victorino Dike made aware of patient and he will evaluate in the morning. Patient to be admitted to the hospitalist service for further management.   Filed Vitals:   09/19/15 2039 09/19/15 2100 09/19/15 2235  BP: 156/62 141/52 159/69  Pulse: 75 71 73  Temp: 99.6 F (37.6 C)  99.5 F (37.5 C)  TempSrc: Oral  Rectal  Resp: 18  13  SpO2: 98% 93% 97%     Antony Madura, PA-C 09/19/15 2313  Loren Racer, MD 09/22/15 [phone]

## 2015-09-19 NOTE — ED Notes (Signed)
[facility] Expected date:  Expected time:  Means of arrival:  Comments: EMS 82 yo female right hip pain x 3 days-no fall-swelling right femur/dementia

## 2015-09-20 ENCOUNTER — Inpatient Hospital Stay (HOSPITAL_COMMUNITY): Payer: Commercial Managed Care - HMO

## 2015-09-20 ENCOUNTER — Inpatient Hospital Stay (HOSPITAL_COMMUNITY): Payer: Commercial Managed Care - HMO | Admitting: Anesthesiology

## 2015-09-20 ENCOUNTER — Encounter (HOSPITAL_COMMUNITY): Payer: Self-pay | Admitting: Certified Registered Nurse Anesthetist

## 2015-09-20 ENCOUNTER — Encounter (HOSPITAL_COMMUNITY)
Admission: EM | Disposition: A | Discharge status: Skilled Nursing Facility | Payer: Self-pay | Source: Home / Self Care | Attending: Internal Medicine

## 2015-09-20 DIAGNOSIS — S72001D Fracture of unspecified part of neck of right femur, subsequent encounter for closed fracture with routine healing: Secondary | ICD-10-CM

## 2015-09-20 DIAGNOSIS — L8990 Pressure ulcer of unspecified site, unspecified stage: Secondary | ICD-10-CM | POA: Insufficient documentation

## 2015-09-20 DIAGNOSIS — S72001A Fracture of unspecified part of neck of right femur, initial encounter for closed fracture: Secondary | ICD-10-CM

## 2015-09-20 HISTORY — PX: HIP ARTHROPLASTY: SHX981

## 2015-09-20 LAB — URINALYSIS, ROUTINE W REFLEX MICROSCOPIC
BILIRUBIN URINE: NEGATIVE
GLUCOSE, UA: NEGATIVE mg/dL
Hgb urine dipstick: NEGATIVE
KETONES UR: NEGATIVE mg/dL
NITRITE: NEGATIVE
PROTEIN: NEGATIVE mg/dL
Specific Gravity, Urine: 1.027 (ref 1.005–1.030)
pH: 5.5 (ref 5.0–8.0)

## 2015-09-20 LAB — SURGICAL PCR SCREEN
MRSA, PCR: NEGATIVE
STAPHYLOCOCCUS AUREUS: NEGATIVE

## 2015-09-20 LAB — URINE MICROSCOPIC-ADD ON

## 2015-09-20 SURGERY — HEMIARTHROPLASTY, HIP, DIRECT ANTERIOR APPROACH, FOR FRACTURE
Anesthesia: Spinal | Laterality: Right

## 2015-09-20 MED ORDER — BUPIVACAINE-EPINEPHRINE (PF) 0.25% -1:200000 IJ SOLN
INTRAMUSCULAR | Status: AC
  Filled 2015-09-20: qty 30

## 2015-09-20 MED ORDER — CEFAZOLIN SODIUM-DEXTROSE 2-4 GM/100ML-% IV SOLN
2.0000 g | Freq: Four times a day (QID) | INTRAVENOUS | Status: AC
  Administered 2015-09-20 – 2015-09-21 (×2): 2 g via INTRAVENOUS
  Filled 2015-09-20 (×2): qty 100

## 2015-09-20 MED ORDER — CEFAZOLIN SODIUM-DEXTROSE 2-4 GM/100ML-% IV SOLN
2.0000 g | INTRAVENOUS | Status: AC
  Administered 2015-09-20: 2 g via INTRAVENOUS
  Filled 2015-09-20: qty 100

## 2015-09-20 MED ORDER — GUAIFENESIN 100 MG/5ML PO SOLN: 200.0000 mg | Freq: Two times a day (BID) | ORAL | Status: DC | PRN

## 2015-09-20 MED ORDER — HYDROGEN PEROXIDE 3 % EX SOLN
CUTANEOUS | Status: AC
  Filled 2015-09-20: qty 473

## 2015-09-20 MED ORDER — ACETAMINOPHEN 500 MG PO TABS: 500.0000 mg | ORAL_TABLET | ORAL | Status: DC | PRN

## 2015-09-20 MED ORDER — CEFAZOLIN SODIUM-DEXTROSE 2-4 GM/100ML-% IV SOLN
INTRAVENOUS | Status: AC
  Filled 2015-09-20: qty 100

## 2015-09-20 MED ORDER — ONDANSETRON HCL 4 MG/2ML IJ SOLN
INTRAMUSCULAR | Status: DC | PRN
  Administered 2015-09-20: 4 mg via INTRAVENOUS

## 2015-09-20 MED ORDER — SODIUM CHLORIDE 0.9 % IV SOLN
INTRAVENOUS | Status: DC
Start: 2015-09-20 — End: 2015-09-20
  Administered 2015-09-20: 02:00:00 via INTRAVENOUS

## 2015-09-20 MED ORDER — KETOROLAC TROMETHAMINE 30 MG/ML IJ SOLN
INTRAMUSCULAR | Status: AC
  Filled 2015-09-20: qty 1

## 2015-09-20 MED ORDER — LIDOCAINE HCL (CARDIAC) 20 MG/ML IV SOLN
INTRAVENOUS | Status: AC
  Filled 2015-09-20: qty 5

## 2015-09-20 MED ORDER — ROCURONIUM BROMIDE 100 MG/10ML IV SOLN
INTRAVENOUS | Status: AC
  Filled 2015-09-20: qty 1

## 2015-09-20 MED ORDER — ONDANSETRON HCL 4 MG/2ML IJ SOLN: 4.0000 mg | Freq: Four times a day (QID) | INTRAMUSCULAR | Status: DC | PRN

## 2015-09-20 MED ORDER — LEVOTHYROXINE SODIUM 50 MCG PO TABS
50.0000 ug | ORAL_TABLET | Freq: Every day | ORAL | Status: DC
  Administered 2015-09-21 – 2015-09-23 (×3): 50 ug via ORAL
  Filled 2015-09-20 (×6): qty 1

## 2015-09-20 MED ORDER — ALUM & MAG HYDROXIDE-SIMETH 200-200-20 MG/5ML PO SUSP: 30.0000 mL | Freq: Four times a day (QID) | ORAL | Status: DC | PRN

## 2015-09-20 MED ORDER — FENTANYL CITRATE (PF) 100 MCG/2ML IJ SOLN
INTRAMUSCULAR | Status: DC | PRN
  Administered 2015-09-20 (×2): 25 ug via INTRAVENOUS

## 2015-09-20 MED ORDER — SENNOSIDES-DOCUSATE SODIUM 8.6-50 MG PO TABS
1.0000 | ORAL_TABLET | Freq: Two times a day (BID) | ORAL | Status: DC
  Administered 2015-09-20 – 2015-09-23 (×5): 1 via ORAL
  Filled 2015-09-20 (×9): qty 1

## 2015-09-20 MED ORDER — SODIUM CHLORIDE 0.9 % IJ SOLN
INTRAMUSCULAR | Status: DC | PRN
  Administered 2015-09-20: 30 mL

## 2015-09-20 MED ORDER — CLONAZEPAM 0.5 MG PO TABS
0.5000 mg | ORAL_TABLET | Freq: Two times a day (BID) | ORAL | Status: DC
  Administered 2015-09-20 – 2015-09-23 (×6): 0.5 mg via ORAL
  Filled 2015-09-20 (×6): qty 1

## 2015-09-20 MED ORDER — ACETAMINOPHEN 10 MG/ML IV SOLN
INTRAVENOUS | Status: AC
  Filled 2015-09-20: qty 100

## 2015-09-20 MED ORDER — KETOROLAC TROMETHAMINE 30 MG/ML IJ SOLN
INTRAMUSCULAR | Status: DC | PRN
  Administered 2015-09-20: 30 mg

## 2015-09-20 MED ORDER — ONDANSETRON HCL 4 MG/2ML IJ SOLN: 4.0000 mg | Freq: Once | INTRAMUSCULAR | Status: DC | PRN

## 2015-09-20 MED ORDER — MENTHOL 3 MG MT LOZG: 1.0000 | LOZENGE | OROMUCOSAL | Status: DC | PRN

## 2015-09-20 MED ORDER — DOCUSATE SODIUM 100 MG PO CAPS
100.0000 mg | ORAL_CAPSULE | Freq: Two times a day (BID) | ORAL | Status: DC
  Administered 2015-09-20 – 2015-09-23 (×4): 100 mg via ORAL

## 2015-09-20 MED ORDER — PROPOFOL 10 MG/ML IV BOLUS
INTRAVENOUS | Status: DC | PRN
  Administered 2015-09-20 (×3): 10 mg via INTRAVENOUS

## 2015-09-20 MED ORDER — TRAZODONE HCL 50 MG PO TABS
50.0000 mg | ORAL_TABLET | Freq: Every evening | ORAL | Status: DC | PRN
  Administered 2015-09-20 – 2015-09-21 (×2): 50 mg via ORAL
  Filled 2015-09-20 (×2): qty 1

## 2015-09-20 MED ORDER — ASPIRIN EC 325 MG PO TBEC
325.0000 mg | DELAYED_RELEASE_TABLET | Freq: Two times a day (BID) | ORAL | Status: DC
  Administered 2015-09-21 – 2015-09-23 (×5): 325 mg via ORAL
  Filled 2015-09-20 (×7): qty 1

## 2015-09-20 MED ORDER — MAGNESIUM HYDROXIDE 400 MG/5ML PO SUSP: 30.0000 mL | Freq: Every evening | ORAL | Status: DC | PRN

## 2015-09-20 MED ORDER — PROPOFOL 10 MG/ML IV BOLUS
INTRAVENOUS | Status: AC
  Filled 2015-09-20: qty 20

## 2015-09-20 MED ORDER — SENNA 8.6 MG PO TABS
1.0000 | ORAL_TABLET | Freq: Two times a day (BID) | ORAL | Status: DC
  Administered 2015-09-21 – 2015-09-23 (×3): 8.6 mg via ORAL

## 2015-09-20 MED ORDER — SODIUM CHLORIDE 0.9 % IV SOLN
INTRAVENOUS | Status: DC
  Administered 2015-09-21: 04:00:00 via INTRAVENOUS

## 2015-09-20 MED ORDER — METOCLOPRAMIDE HCL 5 MG/ML IJ SOLN: 5.0000 mg | Freq: Three times a day (TID) | INTRAMUSCULAR | Status: DC | PRN

## 2015-09-20 MED ORDER — FENTANYL CITRATE (PF) 100 MCG/2ML IJ SOLN
INTRAMUSCULAR | Status: AC
  Filled 2015-09-20: qty 2

## 2015-09-20 MED ORDER — HYDROMORPHONE HCL 1 MG/ML IJ SOLN
0.2500 mg | INTRAMUSCULAR | Status: DC | PRN
  Administered 2015-09-20: 0.5 mg via INTRAVENOUS
  Filled 2015-09-20: qty 1

## 2015-09-20 MED ORDER — PHENYLEPHRINE HCL 10 MG/ML IJ SOLN
INTRAMUSCULAR | Status: AC
  Filled 2015-09-20: qty 1

## 2015-09-20 MED ORDER — SODIUM CHLORIDE 0.9 % IJ SOLN
INTRAMUSCULAR | Status: AC
  Filled 2015-09-20: qty 50

## 2015-09-20 MED ORDER — RISPERIDONE 0.5 MG PO TABS
0.5000 mg | ORAL_TABLET | Freq: Every day | ORAL | Status: DC
  Administered 2015-09-20 – 2015-09-22 (×3): 0.5 mg via ORAL
  Filled 2015-09-20 (×4): qty 1

## 2015-09-20 MED ORDER — RIVASTIGMINE TARTRATE 3 MG PO CAPS
3.0000 mg | ORAL_CAPSULE | Freq: Two times a day (BID) | ORAL | Status: DC
  Administered 2015-09-20 – 2015-09-23 (×6): 3 mg via ORAL
  Filled 2015-09-20 (×9): qty 1

## 2015-09-20 MED ORDER — MEPERIDINE HCL 50 MG/ML IJ SOLN: 6.2500 mg | INTRAMUSCULAR | Status: DC | PRN

## 2015-09-20 MED ORDER — FENTANYL CITRATE (PF) 100 MCG/2ML IJ SOLN: 25.0000 ug | INTRAMUSCULAR | Status: DC | PRN

## 2015-09-20 MED ORDER — ACETAMINOPHEN 650 MG RE SUPP: 650.0000 mg | Freq: Four times a day (QID) | RECTAL | Status: DC | PRN

## 2015-09-20 MED ORDER — METHOCARBAMOL 1000 MG/10ML IJ SOLN
500.0000 mg | Freq: Four times a day (QID) | INTRAMUSCULAR | Status: DC | PRN
  Filled 2015-09-20: qty 5

## 2015-09-20 MED ORDER — BUPIVACAINE HCL (PF) 0.5 % IJ SOLN
INTRAMUSCULAR | Status: AC
  Filled 2015-09-20: qty 30

## 2015-09-20 MED ORDER — ASPIRIN EC 325 MG PO TBEC
325.0000 mg | DELAYED_RELEASE_TABLET | Freq: Every day | ORAL | Status: DC
  Filled 2015-09-20: qty 1

## 2015-09-20 MED ORDER — ESCITALOPRAM OXALATE 5 MG PO TABS
5.0000 mg | ORAL_TABLET | Freq: Every day | ORAL | Status: DC
  Administered 2015-09-21 – 2015-09-23 (×3): 5 mg via ORAL
  Filled 2015-09-20 (×4): qty 1

## 2015-09-20 MED ORDER — ACETAMINOPHEN 500 MG PO TABS
1000.0000 mg | ORAL_TABLET | Freq: Four times a day (QID) | ORAL | Status: AC
  Administered 2015-09-20 – 2015-09-21 (×3): 1000 mg via ORAL
  Filled 2015-09-20 (×4): qty 2

## 2015-09-20 MED ORDER — ACETAMINOPHEN 325 MG PO TABS: 650.0000 mg | ORAL_TABLET | Freq: Four times a day (QID) | ORAL | Status: DC | PRN

## 2015-09-20 MED ORDER — ISOPROPYL ALCOHOL 70 % SOLN
Status: AC
  Filled 2015-09-20: qty 480

## 2015-09-20 MED ORDER — LIDOCAINE HCL (CARDIAC) 20 MG/ML IV SOLN
INTRAVENOUS | Status: DC | PRN
  Administered 2015-09-20: 40 mg via INTRAVENOUS

## 2015-09-20 MED ORDER — POVIDONE-IODINE 10 % EX SWAB: 2.0000 "application " | Freq: Once | CUTANEOUS | Status: DC

## 2015-09-20 MED ORDER — TRAMADOL HCL 50 MG PO TABS
50.0000 mg | ORAL_TABLET | Freq: Four times a day (QID) | ORAL | Status: DC | PRN
  Administered 2015-09-21 – 2015-09-22 (×3): 50 mg via ORAL
  Filled 2015-09-20 (×3): qty 1

## 2015-09-20 MED ORDER — ONDANSETRON HCL 4 MG/2ML IJ SOLN
INTRAMUSCULAR | Status: AC
  Filled 2015-09-20: qty 2

## 2015-09-20 MED ORDER — METOCLOPRAMIDE HCL 10 MG PO TABS: 5.0000 mg | ORAL_TABLET | Freq: Three times a day (TID) | ORAL | Status: DC | PRN

## 2015-09-20 MED ORDER — PHENOL 1.4 % MT LIQD: 1.0000 | OROMUCOSAL | Status: DC | PRN

## 2015-09-20 MED ORDER — SODIUM CHLORIDE 0.9 % IV SOLN
1000.0000 mg | INTRAVENOUS | Status: AC
  Administered 2015-09-20: 1000 mg via INTRAVENOUS
  Filled 2015-09-20: qty 10

## 2015-09-20 MED ORDER — SODIUM CHLORIDE 0.9 % IV SOLN
10.0000 mg | INTRAVENOUS | Status: DC | PRN
  Administered 2015-09-20: 50 ug/min via INTRAVENOUS

## 2015-09-20 MED ORDER — PROPOFOL 500 MG/50ML IV EMUL
INTRAVENOUS | Status: DC | PRN
  Administered 2015-09-20: 25 ug/kg/min via INTRAVENOUS

## 2015-09-20 MED ORDER — LACTATED RINGERS IV SOLN
INTRAVENOUS | Status: DC | PRN
  Administered 2015-09-20: 17:00:00 via INTRAVENOUS

## 2015-09-20 MED ORDER — EPHEDRINE SULFATE 50 MG/ML IJ SOLN
INTRAMUSCULAR | Status: DC | PRN
  Administered 2015-09-20: 10 mg via INTRAVENOUS

## 2015-09-20 MED ORDER — METHOCARBAMOL 500 MG PO TABS
500.0000 mg | ORAL_TABLET | Freq: Four times a day (QID) | ORAL | Status: DC | PRN
  Administered 2015-09-20 – 2015-09-21 (×2): 500 mg via ORAL
  Filled 2015-09-20 (×2): qty 1

## 2015-09-20 MED ORDER — BUPIVACAINE HCL (PF) 0.5 % IJ SOLN
INTRAMUSCULAR | Status: DC | PRN
  Administered 2015-09-20: 1 mL

## 2015-09-20 MED ORDER — HYDROMORPHONE HCL 1 MG/ML IJ SOLN: 0.5000 mg | INTRAMUSCULAR | Status: DC | PRN

## 2015-09-20 MED ORDER — ACETAMINOPHEN 10 MG/ML IV SOLN
1000.0000 mg | INTRAVENOUS | Status: AC
  Administered 2015-09-20: 1000 mg via INTRAVENOUS
  Filled 2015-09-20: qty 100

## 2015-09-20 MED ORDER — ONDANSETRON HCL 4 MG PO TABS: 4.0000 mg | ORAL_TABLET | Freq: Four times a day (QID) | ORAL | Status: DC | PRN

## 2015-09-20 MED ORDER — CHLORHEXIDINE GLUCONATE 4 % EX LIQD: 60.0000 mL | Freq: Once | CUTANEOUS | Status: DC

## 2015-09-20 MED ORDER — ARTIFICIAL TEARS OP OINT
TOPICAL_OINTMENT | OPHTHALMIC | Status: AC
  Filled 2015-09-20: qty 3.5

## 2015-09-20 MED ORDER — BUPIVACAINE-EPINEPHRINE 0.25% -1:200000 IJ SOLN
INTRAMUSCULAR | Status: DC | PRN
  Administered 2015-09-20: 30 mL

## 2015-09-20 SURGICAL SUPPLY — 44 items
BAG SPEC THK2 15X12 ZIP CLS (MISCELLANEOUS)
BAG ZIPLOCK 12X15 (MISCELLANEOUS) IMPLANT
CAPT HIP HEMI 2 ×2 IMPLANT
CHLORAPREP W/TINT 26ML (MISCELLANEOUS) ×5 IMPLANT
CLOTH BEACON ORANGE TIMEOUT ST (SAFETY) ×3 IMPLANT
COVER PERINEAL POST (MISCELLANEOUS) ×3 IMPLANT
DECANTER SPIKE VIAL GLASS SM (MISCELLANEOUS) ×3 IMPLANT
DRAPE LG THREE QUARTER DISP (DRAPES) ×6 IMPLANT
DRAPE STERI IOBAN 125X83 (DRAPES) ×3 IMPLANT
DRAPE U-SHAPE 47X51 STRL (DRAPES) ×6 IMPLANT
DRSG AQUACEL AG ADV 3.5X10 (GAUZE/BANDAGES/DRESSINGS) ×3 IMPLANT
DRSG TEGADERM 4X4.75 (GAUZE/BANDAGES/DRESSINGS) IMPLANT
ELECT PENCIL ROCKER SW 15FT (MISCELLANEOUS) ×3 IMPLANT
ELECT REM PT RETURN 15FT ADLT (MISCELLANEOUS) ×3 IMPLANT
ELECT REM PT RETURN 9FT ADLT (ELECTROSURGICAL) ×3
ELECTRODE REM PT RTRN 9FT ADLT (ELECTROSURGICAL) ×1 IMPLANT
EVACUATOR 1/8 PVC DRAIN (DRAIN) IMPLANT
GAUZE SPONGE 2X2 8PLY STRL LF (GAUZE/BANDAGES/DRESSINGS) IMPLANT
GAUZE SPONGE 4X4 12PLY STRL (GAUZE/BANDAGES/DRESSINGS) ×3 IMPLANT
GLOVE BIO SURGEON STRL SZ8.5 (GLOVE) ×22 IMPLANT
GLOVE BIOGEL PI IND STRL 8.5 (GLOVE) ×1 IMPLANT
GLOVE BIOGEL PI INDICATOR 8.5 (GLOVE) ×2
GOWN SPEC L3 XXLG W/TWL (GOWN DISPOSABLE) ×11 IMPLANT
HANDPIECE INTERPULSE COAX TIP (DISPOSABLE) ×3
HOOD PEEL AWAY FLYTE STAYCOOL (MISCELLANEOUS) ×5 IMPLANT
LIQUID BAND (GAUZE/BANDAGES/DRESSINGS) ×6 IMPLANT
MANIFOLD NEPTUNE II (INSTRUMENTS) ×3 IMPLANT
MARKER SKIN DUAL TIP RULER LAB (MISCELLANEOUS) ×3 IMPLANT
NDL SPNL 18GX3.5 QUINCKE PK (NEEDLE) ×1 IMPLANT
NEEDLE SPNL 18GX3.5 QUINCKE PK (NEEDLE) ×3 IMPLANT
PACK ANTERIOR HIP CUSTOM (KITS) ×3 IMPLANT
SAW OSC TIP CART 19.5X105X1.3 (SAW) ×3 IMPLANT
SEALER BIPOLAR AQUA 6.0 (INSTRUMENTS) IMPLANT
SET HNDPC FAN SPRY TIP SCT (DISPOSABLE) IMPLANT
SOL PREP POV-IOD 4OZ 10% (MISCELLANEOUS) ×1 IMPLANT
SPONGE GAUZE 2X2 STER 10/PKG (GAUZE/BANDAGES/DRESSINGS)
SUT ETHIBOND NAB CT1 #1 30IN (SUTURE) ×6 IMPLANT
SUT MNCRL AB 3-0 PS2 18 (SUTURE) ×5 IMPLANT
SUT MON AB 2-0 CT1 36 (SUTURE) ×4 IMPLANT
SUT VIC AB 0 CT2 27 (SUTURE) ×2 IMPLANT
SUT VIC AB 2-0 CT1 27 (SUTURE) ×3
SUT VIC AB 2-0 CT1 TAPERPNT 27 (SUTURE) ×1 IMPLANT
SUT VLOC 180 0 24IN GS25 (SUTURE) ×3 IMPLANT
SYR 50ML LL SCALE MARK (SYRINGE) IMPLANT

## 2015-09-20 NOTE — Anesthesia Procedure Notes (Signed)
Spinal Patient location during procedure: OR End time: 09/20/2015 6:41 PM Staffing Anesthesiologist: Cristela BlueJACKSON, Deborah Pop Preanesthetic Checklist Completed: patient identified, site marked, surgical consent, pre-op evaluation, timeout performed, IV checked, risks and benefits discussed and monitors and equipment checked Spinal Block Patient position: right lateral decubitus Prep: DuraPrep Patient monitoring: heart rate, cardiac monitor, continuous pulse ox and blood pressure Approach: midline Location: L2-3 Injection technique: catheter Needle Needle type: Sprotte and Tuohy  Needle gauge: 17G. Needle length: 9 cm Needle insertion depth: 6 cm Catheter at skin depth: 12 cm Assessment Sensory level: T4 Additional Notes LOR at 5cm; very slow advance to clear CSF( NO SX)  Catheter passed easily.......(+) asp CSF Dosed 1cc bupiv .75%, then after 5 min 1cc .5% bupiv

## 2015-09-20 NOTE — Consult Note (Signed)
 ORTHOPAEDIC CONSULTATION  REQUESTING PHYSICIAN: Anand D Hongalgi, MD  PCP:  BLYTH, [name], MD  Chief Complaint: right hip pain  HPI: [name] is a 82 y.o. female who complains of  Right hip pain. History limited due to dementia. Lives in memory care. States she fell "a while ago" and now has right hip pain, preventing her from walking. Denies other injuries. Admitted to hospitalist.  Past Medical History  Diagnosis Date  . Arthritis   . Chicken pox   . Chronic kidney disease   . Colon polyps   . Hypertension   . Dementia   . Angina   . Dysrhythmia   . Heart murmur   . Shortness of breath   . Recurrent upper respiratory infection (URI)   . Pneumonia   . Hypothyroidism   . Anemia   . Headache(784.0)   . Anxiety   . Depression   . Valvular heart disease 09/04/2012  . Left leg pain 10/02/2012  . UTI (urinary tract infection) 12/30/2012  . Acute bronchitis 04/10/2013  . Dehydration 08/31/2013  . Arthritis 10/02/2012  . Asthma    Past Surgical History  Procedure Laterality Date  . Appendectomy    . Cholecystectomy    . Kidney stones    . Tonsillectomy    . Abdominal hysterectomy    . Hand surgery    . Feet surgery    . Shoulder arthroscopy    . Back surgery    . Tonsillectomy    . Eye surgery      cataract removed and eye lids lifted  . Fracture surgery      bilateral arms  . Tubal ligation    . Colonoscopy w/ polypectomy     Social History   Social History  . Marital Status: Widowed    Spouse Name: N/A  . Number of Children: N/A  . Years of Education: N/A   Social History Main Topics  . Smoking status: Former Smoker -- 0.20 packs/day for 1 years    Types: Cigarettes    Quit date: 06/02/1941  . Smokeless tobacco: Never Used  . Alcohol Use: No  . Drug Use: No  . Sexual Activity: Not Currently    Birth Control/ Protection: Abstinence   Other Topics Concern  . None   Social History Narrative   Lives at [facility] with son and dil   Doesn't use cane or walker, but has cane at home. Remote smoking history.    Family History  Problem Relation Age of Onset  . Heart disease Mother   . Heart disease Father   . Heart disease Other   . Birth defects Other    Allergies  Allergen Reactions  . Morphine And Related     Not noted on MAR  . Codeine Rash  . Hydrocodone Rash   Prior to Admission medications   Medication Sig Start Date End Date Taking? Authorizing Provider  acetaminophen (TYLENOL) 500 MG tablet Take 500 mg by mouth every 4 (four) hours as needed for mild pain, moderate pain or fever.   Yes Historical Provider, MD  alum & mag hydroxide-simeth (MINTOX) 200-200-20 MG/5ML suspension Take 30 mLs by mouth every 6 (six) hours as needed for indigestion or heartburn.   Yes Historical Provider, MD  aspirin 81 MG chewable tablet Chew 81 mg by mouth daily.   Yes Historical Provider, MD  clonazePAM (KLONOPIN) 0.5 MG tablet Take 1 tablet (0.5 mg total) by mouth every 4 (four) hours as   needed for anxiety. Patient taking differently: Take 0.5 mg by mouth 2 (two) times daily.  06/28/15  Yes Iskra M Myers, MD  escitalopram (LEXAPRO) 5 MG tablet Take 5 mg by mouth daily.   Yes Historical Provider, MD  guaifenesin (ROBITUSSIN) 100 MG/5ML syrup Take 200 mg by mouth 2 (two) times daily as needed for cough.   Yes Historical Provider, MD  levothyroxine (SYNTHROID, LEVOTHROID) 50 MCG tablet TAKE 1 TABLET (50 MCG TOTAL) BY MOUTH DAILY. Patient taking differently: Take 1 tablet (50 mcg total) by mouth daily. 09/25/14  Yes Elizabeth A Crawford, MD  loperamide (IMODIUM) 2 MG capsule Take 2 mg by mouth 4 (four) times daily as needed for diarrhea or loose stools.   Yes Historical Provider, MD  magnesium hydroxide (MILK OF MAGNESIA) 400 MG/5ML suspension Take 30 mLs by mouth at bedtime as needed for mild constipation.   Yes Historical Provider, MD  neomycin-bacitracin-polymyxin (NEOSPORIN) ointment Apply 1 application topically daily as needed for  wound care.    Yes Historical Provider, MD  PRESCRIPTION MEDICATION Take 1 each by mouth 3 (three) times daily. Mighty Shakes   Yes Historical Provider, MD  risperiDONE (RISPERDAL) 0.5 MG tablet Take 0.5 mg by mouth at bedtime.   Yes Historical Provider, MD  rivastigmine (EXELON) 3 MG capsule Take 3 mg by mouth 2 (two) times daily.   Yes Historical Provider, MD  senna-docusate (SENEXON-S) 8.6-50 MG tablet Take 1 tablet by mouth 2 (two) times daily.   Yes Historical Provider, MD  traMADol (ULTRAM) 50 MG tablet Take 50 mg by mouth 2 (two) times daily.   Yes Historical Provider, MD  traZODone (DESYREL) 50 MG tablet Take 50 mg by mouth at bedtime as needed for sleep.   Yes Historical Provider, MD   Chest Portable 1 View  09/20/2015  CLINICAL DATA:  Preop parenchymal EXAM: PORTABLE CHEST 1 VIEW COMPARISON:  09/19/2015 FINDINGS: Normal cardiac silhouette. Lungs are clear. Scoliosis noted. No acute osseous abnormality. IMPRESSION: 1. No acute cardiopulmonary findings. 2. Scoliosis. Electronically Signed   By: Stewart  Edmunds M.D.   On: 09/20/2015 07:11   Dg Knee Right Port  09/20/2015  CLINICAL DATA:  Fall 3 days ago.  RIGHT diffuse knee pain EXAM: PORTABLE RIGHT KNEE - 1-2 VIEW COMPARISON:  02/06/2015 FINDINGS: No fracture of the proximal tibia or distal femur. Patella is normal. No joint effusion. There is and a calcification within the medial lateral compartments consists with chondrocalcinosis. IMPRESSION: 1. No acute findings of the RIGHT knee. 2. Chondrocalcinosis, unchanged. Electronically Signed   By: Stewart  Edmunds M.D.   On: 09/20/2015 08:44   Dg Hip Unilat With Pelvis 2-3 Views Right  09/19/2015  CLINICAL DATA:  Dementia. Right hip pain, 3 days duration. No known injury. EXAM: DG HIP (WITH OR WITHOUT PELVIS) 2-3V RIGHT COMPARISON:  02/06/2015 FINDINGS: Complete femoral neck fracture on the right with displacement of 1 cm. No other pelvic fracture. Left hip appears normal. IMPRESSION: Complete  femoral neck fracture on the right, displaced 1 cm. Electronically Signed   By: Mark  Shogry M.D.   On: 09/19/2015 22:15    Positive ROS: All other systems have been reviewed and were otherwise negative with the exception of those mentioned in the HPI and as above.  Physical Exam: General: Alert, no acute distress Cardiovascular: No pedal edema Respiratory: No cyanosis, no use of accessory musculature GI: No organomegaly, abdomen is soft and non-tender Skin: No lesions in the area of chief complaint Neurologic: Sensation intact distally Psychiatric:   Patient is competent for consent with normal mood and affect Lymphatic: No axillary or cervical lymphadenopathy  MUSCULOSKELETAL:  RLE: skin intact. Shortened and externally rotated. Pain with logroll. +TA/GS/EHL. SILT. 2+ DP.  Assessment: Dementia Displaced right femoral neck fracture  Plan: To OR for right hip hemiarthroplasty later today Will discuss with family due to dementia Cont NPO Hold chemical DVT ppx    Malynda Smolinski James, MD Cell [phone]    09/20/2015 12:51 PM  

## 2015-09-20 NOTE — Progress Notes (Signed)
Nutrition Brief Note  Patient identified via HF Protocol   Wt Readings from Last 15 Encounters:  09/20/15 177 lb 11.1 oz (80.6 kg)  02/15/15 159 lb 3.2 oz (72.213 kg)  01/15/15 172 lb 2.9 oz (78.1 kg)  11/28/14 175 lb 7.8 oz (79.6 kg)  11/26/14 175 lb 4.3 oz (79.5 kg)  10/27/14 186 lb (84.369 kg)  10/24/14 180 lb (81.647 kg)  09/19/14 180 lb (81.647 kg)  09/07/14 180 lb 12.8 oz (82.01 kg)  07/07/14 177 lb (80.287 kg)  07/06/14 177 lb 12 oz (80.627 kg)  06/08/14 182 lb 9.6 oz (82.827 kg)  05/08/14 185 lb 6.4 oz (84.097 kg)  04/06/14 183 lb 12.8 oz (83.371 kg)  03/14/14 187 lb (84.823 kg)    Body mass index is 29.57 kg/(m^2). Patient meets criteria for overweight based on current BMI.   Current diet order is NPO, patient is consuming approximately No meals at this time. Labs and medications reviewed.   No nutrition interventions warranted at this time. If nutrition issues arise, please consult RD.   Dionne AnoWilliam M. Yasmen Cortner, MS, RD LDN After Hours/Weekend Pager [phone]

## 2015-09-20 NOTE — Anesthesia Preprocedure Evaluation (Addendum)
Anesthesia Evaluation  Patient identified by MRN, date of birth, ID band Patient awake    Reviewed: Allergy & Precautions, H&P , Patient's Chart, lab work & pertinent test results, reviewed documented beta blocker date and time   Airway Mallampati: II  TM Distance: >3 FB Neck ROM: full    Dental no notable dental hx.    Pulmonary former smoker,    Pulmonary exam normal breath sounds clear to auscultation       Cardiovascular hypertension,  Rhythm:regular Rate:Normal     Neuro/Psych    GI/Hepatic   Endo/Other    Renal/GU      Musculoskeletal   Abdominal   Peds  Hematology   Anesthesia Other Findings Hypertension 1*AVB, RBBB, sinus   Dementia   Hb 11.9  Angina    Dysrhythmia     Heart murmur    Shortness of breath     Valvular heart disease           Reproductive/Obstetrics                            Anesthesia Physical Anesthesia Plan  ASA: II  Anesthesia Plan: Spinal   Post-op Pain Management:    Induction: Intravenous  Airway Management Planned: Natural Airway and Mask  Additional Equipment:   Intra-op Plan:   Post-operative Plan: Extubation in OR  Informed Consent: I have reviewed the patients History and Physical, chart, labs and discussed the procedure including the risks, benefits and alternatives for the proposed anesthesia with the patient or authorized representative who has indicated his/her understanding and acceptance.   Dental Advisory Given and Dental advisory given  Plan Discussed with: CRNA and Surgeon  Anesthesia Plan Comments: (Pt alone, sleeping in Holding  Does not stay awake to question Hx. Also has dementia.   )       Anesthesia Quick Evaluation

## 2015-09-20 NOTE — Discharge Instructions (Signed)
°Dr. Jennamarie Goings °Joint Replacement Specialist °[facility] °[address] °[place], [place] [address] °[phone] ° ° °TOTAL HIP REPLACEMENT POSTOPERATIVE DIRECTIONS ° ° ° °Hip Rehabilitation, Guidelines Following Surgery  ° °WEIGHT BEARING °Weight bearing as tolerated with assist device (walker, cane, etc) as directed, use it as long as suggested by your surgeon or therapist, typically at least 4-6 weeks. ° °The results of a hip operation are greatly improved after range of motion and muscle strengthening exercises. Follow all safety measures which are given to protect your hip. If any of these exercises cause increased pain or swelling in your joint, decrease the amount until you are comfortable again. Then slowly increase the exercises. Call your caregiver if you have problems or questions.  ° °HOME CARE INSTRUCTIONS  °Most of the following instructions are designed to prevent the dislocation of your new hip.  °Remove items at home which could result in a fall. This includes throw rugs or furniture in walking pathways.  °Continue medications as instructed at time of discharge. °· You may have some home medications which will be placed on hold until you complete the course of blood thinner medication. °· You may start showering once you are discharged home. Do not remove your dressing. °Do not put on socks or shoes without following the instructions of your caregivers.   °Sit on chairs with arms. Use the chair arms to help push yourself up when arising.  °Arrange for the use of a toilet seat elevator so you are not sitting low.  °· Walk with walker as instructed.  °You may resume a sexual relationship in one month or when given the OK by your caregiver.  °Use walker as long as suggested by your caregivers.  °You may put full weight on your legs and walk as much as is comfortable. °Avoid periods of inactivity such as sitting longer than an hour when not asleep. This helps prevent  blood clots.  °You may return to work once you are cleared by your surgeon.  °Do not drive a car for 6 weeks or until released by your surgeon.  °Do not drive while taking narcotics.  °Wear elastic stockings for two weeks following surgery during the day but you may remove then at night.  °Make sure you keep all of your appointments after your operation with all of your doctors and caregivers. You should call the office at the above phone number and make an appointment for approximately two weeks after the date of your surgery. °Please pick up a stool softener and laxative for home use as long as you are requiring pain medications. °· ICE to the affected hip every three hours for 30 minutes at a time and then as needed for pain and swelling. Continue to use ice on the hip for pain and swelling from surgery. You may notice swelling that will progress down to the foot and ankle.  This is normal after surgery.  Elevate the leg when you are not up walking on it.   °It is important for you to complete the blood thinner medication as prescribed by your doctor. °· Continue to use the breathing machine which will help keep your temperature down.  It is common for your temperature to cycle up and down following surgery, especially at night when you are not up moving around and exerting yourself.  The breathing machine keeps your lungs expanded and your temperature down. ° °RANGE OF MOTION AND STRENGTHENING EXERCISES  °These exercises are   designed to help you keep full movement of your hip joint. Follow your caregiver's or physical therapist's instructions. Perform all exercises about fifteen times, three times per day or as directed. Exercise both hips, even if you have had only one joint replacement. These exercises can be done on a training (exercise) mat, on the floor, on a table or on a bed. Use whatever works the best and is most comfortable for you. Use music or television while you are exercising so that the exercises  are a pleasant break in your day. This will make your life better with the exercises acting as a break in routine you can look forward to.  °Lying on your back, slowly slide your foot toward your buttocks, raising your knee up off the floor. Then slowly slide your foot back down until your leg is straight again.  °Lying on your back spread your legs as far apart as you can without causing discomfort.  °Lying on your side, raise your upper leg and foot straight up from the floor as far as is comfortable. Slowly lower the leg and repeat.  °Lying on your back, tighten up the muscle in the front of your thigh (quadriceps muscles). You can do this by keeping your leg straight and trying to raise your heel off the floor. This helps strengthen the largest muscle supporting your knee.  °Lying on your back, tighten up the muscles of your buttocks both with the legs straight and with the knee bent at a comfortable angle while keeping your heel on the floor.  ° °SKILLED REHAB INSTRUCTIONS: °If the patient is transferred to a skilled rehab facility following release from the hospital, a list of the current medications will be sent to the facility for the patient to continue.  When discharged from the skilled rehab facility, please have the facility set up the patient's Home Health Physical Therapy prior to being released. Also, the skilled facility will be responsible for providing the patient with their medications at time of release from the facility to include their pain medication and their blood thinner medication. If the patient is still at the rehab facility at time of the two week follow up appointment, the skilled rehab facility will also need to assist the patient in arranging follow up appointment in our office and any transportation needs. ° °MAKE SURE YOU:  °Understand these instructions.  °Will watch your condition.  °Will get help right away if you are not doing well or get worse. ° °Pick up stool softner and  laxative for home use following surgery while on pain medications. °Do not remove your dressing. °The dressing is waterproof--it is OK to take showers. °Continue to use ice for pain and swelling after surgery. °Do not use any lotions or creams on the incision until instructed by your surgeon. °Total Hip Protocol. ° ° °

## 2015-09-20 NOTE — H&P (View-Only) (Signed)
ORTHOPAEDIC CONSULTATION  REQUESTING PHYSICIAN: Elease Etienne, MD  PCP:  Danise Edge, MD  Chief Complaint: right hip pain  HPI: [name] is a 82 y.o. female who complains of  Right hip pain. History limited due to dementia. Lives in memory care. States she fell "a while ago" and now has right hip pain, preventing her from walking. Denies other injuries. Admitted to hospitalist.  Past Medical History  Diagnosis Date  . Arthritis   . Chicken pox   . Chronic kidney disease   . Colon polyps   . Hypertension   . Dementia   . Angina   . Dysrhythmia   . Heart murmur   . Shortness of breath   . Recurrent upper respiratory infection (URI)   . Pneumonia   . Hypothyroidism   . Anemia   . Headache(784.0)   . Anxiety   . Depression   . Valvular heart disease 09/04/2012  . Left leg pain 10/02/2012  . UTI (urinary tract infection) 12/30/2012  . Acute bronchitis 04/10/2013  . Dehydration 08/31/2013  . Arthritis 10/02/2012  . Asthma    Past Surgical History  Procedure Laterality Date  . Appendectomy    . Cholecystectomy    . Kidney stones    . Tonsillectomy    . Abdominal hysterectomy    . Hand surgery    . Feet surgery    . Shoulder arthroscopy    . Back surgery    . Tonsillectomy    . Eye surgery      cataract removed and eye lids lifted  . Fracture surgery      bilateral arms  . Tubal ligation    . Colonoscopy w/ polypectomy     Social History   Social History  . Marital Status: Widowed    Spouse Name: N/A  . Number of Children: N/A  . Years of Education: N/A   Social History Main Topics  . Smoking status: Former Smoker -- 0.20 packs/day for 1 years    Types: Cigarettes    Quit date: 06/02/1941  . Smokeless tobacco: Never Used  . Alcohol Use: No  . Drug Use: No  . Sexual Activity: Not Currently    Birth Control/ Protection: Abstinence   Other Topics Concern  . None   Social History Narrative   Lives at [facility] with son and dil   Doesn't use cane or walker, but has cane at home. Remote smoking history.    Family History  Problem Relation Age of Onset  . Heart disease Mother   . Heart disease Father   . Heart disease Other   . Birth defects Other    Allergies  Allergen Reactions  . Morphine And Related     Not noted on MAR  . Codeine Rash  . Hydrocodone Rash   Prior to Admission medications   Medication Sig Start Date End Date Taking? Authorizing Provider  acetaminophen (TYLENOL) 500 MG tablet Take 500 mg by mouth every 4 (four) hours as needed for mild pain, moderate pain or fever.   Yes Historical Provider, MD  alum & mag hydroxide-simeth (MINTOX) 200-200-20 MG/5ML suspension Take 30 mLs by mouth every 6 (six) hours as needed for indigestion or heartburn.   Yes Historical Provider, MD  aspirin 81 MG chewable tablet Chew 81 mg by mouth daily.   Yes Historical Provider, MD  clonazePAM (KLONOPIN) 0.5 MG tablet Take 1 tablet (0.5 mg total) by mouth every 4 (four) hours as  needed for anxiety. Patient taking differently: Take 0.5 mg by mouth 2 (two) times daily.  06/28/15  Yes Dorothea Ogle, MD  escitalopram (LEXAPRO) 5 MG tablet Take 5 mg by mouth daily.   Yes Historical Provider, MD  guaifenesin (ROBITUSSIN) 100 MG/5ML syrup Take 200 mg by mouth 2 (two) times daily as needed for cough.   Yes Historical Provider, MD  levothyroxine (SYNTHROID, LEVOTHROID) 50 MCG tablet TAKE 1 TABLET (50 MCG TOTAL) BY MOUTH DAILY. Patient taking differently: Take 1 tablet (50 mcg total) by mouth daily. 09/25/14  Yes Myrlene Broker, MD  loperamide (IMODIUM) 2 MG capsule Take 2 mg by mouth 4 (four) times daily as needed for diarrhea or loose stools.   Yes Historical Provider, MD  magnesium hydroxide (MILK OF MAGNESIA) 400 MG/5ML suspension Take 30 mLs by mouth at bedtime as needed for mild constipation.   Yes Historical Provider, MD  neomycin-bacitracin-polymyxin (NEOSPORIN) ointment Apply 1 application topically daily as needed for  wound care.    Yes Historical Provider, MD  PRESCRIPTION MEDICATION Take 1 each by mouth 3 (three) times daily. Mighty Shakes   Yes Historical Provider, MD  risperiDONE (RISPERDAL) 0.5 MG tablet Take 0.5 mg by mouth at bedtime.   Yes Historical Provider, MD  rivastigmine (EXELON) 3 MG capsule Take 3 mg by mouth 2 (two) times daily.   Yes Historical Provider, MD  senna-docusate (SENEXON-S) 8.6-50 MG tablet Take 1 tablet by mouth 2 (two) times daily.   Yes Historical Provider, MD  traMADol (ULTRAM) 50 MG tablet Take 50 mg by mouth 2 (two) times daily.   Yes Historical Provider, MD  traZODone (DESYREL) 50 MG tablet Take 50 mg by mouth at bedtime as needed for sleep.   Yes Historical Provider, MD   Chest Portable 1 View  09/20/2015  CLINICAL DATA:  Preop parenchymal EXAM: PORTABLE CHEST 1 VIEW COMPARISON:  09/19/2015 FINDINGS: Normal cardiac silhouette. Lungs are clear. Scoliosis noted. No acute osseous abnormality. IMPRESSION: 1. No acute cardiopulmonary findings. 2. Scoliosis. Electronically Signed   By: Genevive Bi M.D.   On: 09/20/2015 07:11   Dg Knee Right Port  09/20/2015  CLINICAL DATA:  Fall 3 days ago.  RIGHT diffuse knee pain EXAM: PORTABLE RIGHT KNEE - 1-2 VIEW COMPARISON:  02/06/2015 FINDINGS: No fracture of the proximal tibia or distal femur. Patella is normal. No joint effusion. There is and a calcification within the medial lateral compartments consists with chondrocalcinosis. IMPRESSION: 1. No acute findings of the RIGHT knee. 2. Chondrocalcinosis, unchanged. Electronically Signed   By: Genevive Bi M.D.   On: 09/20/2015 08:44   Dg Hip Unilat With Pelvis 2-3 Views Right  09/19/2015  CLINICAL DATA:  Dementia. Right hip pain, 3 days duration. No known injury. EXAM: DG HIP (WITH OR WITHOUT PELVIS) 2-3V RIGHT COMPARISON:  02/06/2015 FINDINGS: Complete femoral neck fracture on the right with displacement of 1 cm. No other pelvic fracture. Left hip appears normal. IMPRESSION: Complete  femoral neck fracture on the right, displaced 1 cm. Electronically Signed   By: Paulina Fusi M.D.   On: 09/19/2015 22:15    Positive ROS: All other systems have been reviewed and were otherwise negative with the exception of those mentioned in the HPI and as above.  Physical Exam: General: Alert, no acute distress Cardiovascular: No pedal edema Respiratory: No cyanosis, no use of accessory musculature GI: No organomegaly, abdomen is soft and non-tender Skin: No lesions in the area of chief complaint Neurologic: Sensation intact distally Psychiatric:  Patient is competent for consent with normal mood and affect Lymphatic: No axillary or cervical lymphadenopathy  MUSCULOSKELETAL:  RLE: skin intact. Shortened and externally rotated. Pain with logroll. +TA/GS/EHL. SILT. 2+ DP.  Assessment: Dementia Displaced right femoral neck fracture  Plan: To OR for right hip hemiarthroplasty later today Will discuss with family due to dementia Cont NPO Hold chemical DVT ppx    Linna CapriceSwinteck, Cloyde ReamsBrian James, MD Cell [phone]    09/20/2015 12:51 PM

## 2015-09-20 NOTE — Transfer of Care (Signed)
Immediate Anesthesia Transfer of Care Note  Patient: Deborah KusterStella P Dillingham  Procedure(s) Performed: Procedure(s): ARTHROPLASTY RIGHT  BIPOLAR ANTERIOR HIP (HEMIARTHROPLASTY) (Right)  Patient Location: PACU  Anesthesia Type:Spinal  Level of Consciousness:  sedated, patient cooperative and responds to stimulation  Airway & Oxygen Therapy:Patient Spontanous Breathing and Patient connected to face mask oxgen  Post-op Assessment:  Report given to PACU RN and Post -op Vital signs reviewed and stable  Post vital signs:  Reviewed and stable  Last Vitals:  Filed Vitals:   09/20/15 0553 09/20/15 1346  BP: 141/53 132/57  Pulse: 64 78  Temp: 37.5 C 37.1 C  Resp: 15 15    Complications: No apparent anesthesia complications

## 2015-09-20 NOTE — Interval H&P Note (Signed)
History and Physical Interval Note:  09/20/2015 6:16 PM  Jeannette CorpusStella P Haselton  has presented today for surgery, with the diagnosis of righ hip fracture  The various methods of treatment have been discussed with the patient and family. After consideration of risks, benefits and other options for treatment, the patient has consented to  Procedure(s): ARTHROPLASTY RIGHT  BIPOLAR ANTERIOR HIP (HEMIARTHROPLASTY) (Right) as a surgical intervention .  The patient's history has been reviewed, patient examined, no change in status, stable for surgery.  I have reviewed the patient's chart and labs.  Questions were answered to the patient's satisfaction.     Discussed the situation with the patient's son. I recommend R hip hemi to help restore mobility. The risks, benefits, and alternatives were discussed with the patient. There are risks associated with the surgery including, but not limited to, problems with anesthesia (death), infection, instability (giving out of the joint), dislocation, differences in leg length/angulation/rotation, fracture of bones, loosening or failure of implants, hematoma (blood accumulation) which may require surgical drainage, blood clots, pulmonary embolism, nerve injury (foot drop and lateral thigh numbness), and blood vessel injury. He understands that her 1 year mortality is roughly 30-50%. The patient understands these risks and elects to proceed.    Gionni Freese, Cloyde ReamsBrian James

## 2015-09-20 NOTE — H&P (Signed)
History and Physical    [name] JXB:[id number] DOB: 1921-03-12 DOA: 09/19/2015  Referring MD/NP/PA: Antony Madura PCP: Danise Edge, MD Patient coming from: [facility]  Chief Complaint: Hip Pain  HPI: [name] is a 82 y.o. female with medical history significant of arthritis, HTN, valvular heart disease, CKD.  Patient presents to the ED with c/o 3 day history of severe RLE pain.  Staff at facility deny any falls within the past month.  Patient has severe R hip pain with any movement or palpation.  Nothing makes pain better or worse.  ED Course: Patient was found to have a R femoral neck fracture.  Dr. Victorino Dike was called by EDP, he asked that the patient be admitted to [facility] hospital and he will see patient in consult later in the day.  Review of Systems: As per HPI otherwise 10 point review of systems negative.    Past Medical History  Diagnosis Date  . Arthritis   . Chicken pox   . Chronic kidney disease   . Colon polyps   . Hypertension   . Dementia   . Angina   . Dysrhythmia   . Heart murmur   . Shortness of breath   . Recurrent upper respiratory infection (URI)   . Pneumonia   . Hypothyroidism   . Anemia   . Headache(784.0)   . Anxiety   . Depression   . Valvular heart disease 09/04/2012  . Left leg pain 10/02/2012  . UTI (urinary tract infection) 12/30/2012  . Acute bronchitis 04/10/2013  . Dehydration 08/31/2013  . Arthritis 10/02/2012  . Asthma     Past Surgical History  Procedure Laterality Date  . Appendectomy    . Cholecystectomy    . Kidney stones    . Tonsillectomy    . Abdominal hysterectomy    . Hand surgery    . Feet surgery    . Shoulder arthroscopy    . Back surgery    . Tonsillectomy    . Eye surgery      cataract removed and eye lids lifted  . Fracture surgery      bilateral arms  . Tubal ligation    . Colonoscopy w/ polypectomy       reports that she quit smoking about 74 years ago. Her smoking use included Cigarettes. She has a .2 pack-year  smoking history. She has never used smokeless tobacco. She reports that she does not drink alcohol or use illicit drugs.  Allergies  Allergen Reactions  . Morphine And Related     Not noted on MAR  . Codeine Rash  . Hydrocodone Rash    Family History  Problem Relation Age of Onset  . Heart disease Mother   . Heart disease Father   . Heart disease Other   . Birth defects Other      Prior to Admission medications   Medication Sig Start Date End Date Taking? Authorizing Provider  acetaminophen (TYLENOL) 500 MG tablet Take 500 mg by mouth every 4 (four) hours as needed for mild pain, moderate pain or fever.   Yes Historical Provider, MD  alum & mag hydroxide-simeth (MINTOX) 200-200-20 MG/5ML suspension Take 30 mLs by mouth every 6 (six) hours as needed for indigestion or heartburn.   Yes Historical Provider, MD  aspirin 81 MG chewable tablet Chew 81 mg by mouth daily.   Yes Historical Provider, MD  clonazePAM (KLONOPIN) 0.5 MG tablet Take 1 tablet (0.5 mg total) by mouth every 4 (  four) hours as needed for anxiety. Patient taking differently: Take 0.5 mg by mouth 2 (two) times daily.  06/28/15  Yes Dorothea Ogle, MD  escitalopram (LEXAPRO) 5 MG tablet Take 5 mg by mouth daily.   Yes Historical Provider, MD  guaifenesin (ROBITUSSIN) 100 MG/5ML syrup Take 200 mg by mouth 2 (two) times daily as needed for cough.   Yes Historical Provider, MD  levothyroxine (SYNTHROID, LEVOTHROID) 50 MCG tablet TAKE 1 TABLET (50 MCG TOTAL) BY MOUTH DAILY. Patient taking differently: Take 1 tablet (50 mcg total) by mouth daily. 09/25/14  Yes Myrlene Broker, MD  loperamide (IMODIUM) 2 MG capsule Take 2 mg by mouth 4 (four) times daily as needed for diarrhea or loose stools.   Yes Historical Provider, MD  magnesium hydroxide (MILK OF MAGNESIA) 400 MG/5ML suspension Take 30 mLs by mouth at bedtime as needed for mild constipation.   Yes Historical Provider, MD  neomycin-bacitracin-polymyxin (NEOSPORIN) ointment  Apply 1 application topically daily as needed for wound care.    Yes Historical Provider, MD  PRESCRIPTION MEDICATION Take 1 each by mouth 3 (three) times daily. Mighty Shakes   Yes Historical Provider, MD  risperiDONE (RISPERDAL) 0.5 MG tablet Take 0.5 mg by mouth at bedtime.   Yes Historical Provider, MD  rivastigmine (EXELON) 3 MG capsule Take 3 mg by mouth 2 (two) times daily.   Yes Historical Provider, MD  senna-docusate (SENEXON-S) 8.6-50 MG tablet Take 1 tablet by mouth 2 (two) times daily.   Yes Historical Provider, MD  traMADol (ULTRAM) 50 MG tablet Take 50 mg by mouth 2 (two) times daily.   Yes Historical Provider, MD  traZODone (DESYREL) 50 MG tablet Take 50 mg by mouth at bedtime as needed for sleep.   Yes Historical Provider, MD    Physical Exam: Filed Vitals:   09/19/15 2235 09/19/15 2300 09/20/15 0050 09/20/15 0553  BP: 159/69 159/62 163/52 141/53  Pulse: 73 80 81 64  Temp: 99.5 F (37.5 C)  99.1 F (37.3 C) 99.5 F (37.5 C)  TempSrc: Rectal  Axillary Axillary  Resp: 13 11 15 15   Weight:   80.6 kg (177 lb 11.1 oz)   SpO2: 97% 95% 95% 95%      Constitutional: Uncomfortable due to R hip pain, and needing to go to the bathroom Filed Vitals:   09/19/15 2235 09/19/15 2300 09/20/15 0050 09/20/15 0553  BP: 159/69 159/62 163/52 141/53  Pulse: 73 80 81 64  Temp: 99.5 F (37.5 C)  99.1 F (37.3 C) 99.5 F (37.5 C)  TempSrc: Rectal  Axillary Axillary  Resp: 13 11 15 15   Weight:   80.6 kg (177 lb 11.1 oz)   SpO2: 97% 95% 95% 95%   Eyes: PERRL, lids and conjunctivae normal ENMT: Mucous membranes are moist. Posterior pharynx clear of any exudate or lesions.Normal dentition.  Neck: normal, supple, no masses, no thyromegaly Respiratory: clear to auscultation bilaterally, no wheezing, no crackles. Normal respiratory effort. No accessory muscle use.  Cardiovascular: Regular rate and rhythm, no murmurs / rubs / gallops. No extremity edema. 2+ pedal pulses. No carotid bruits.    Abdomen: no tenderness, no masses palpated. No hepatosplenomegaly. Bowel sounds positive.  Musculoskeletal: no clubbing / cyanosis. No joint deformity upper and lower extremities. , no contractures. Normal muscle tone. R hip pain with any movement.  Bony tenderness and deformity. Skin: no rashes, lesions, ulcers. No induration Neurologic: CN 2-12 grossly intact. Sensation intact, DTR normal. Strength 5/5 in all 4.  Psychiatric:  Normal judgment and insight. Alert and oriented x 3. Normal mood.    Labs on Admission: I have personally reviewed following labs and imaging studies  CBC:  Recent Labs Lab 09/19/15 2212  WBC 16.6*  NEUTROABS 13.6*  HGB 11.9*  HCT 35.2*  MCV 84.8  PLT 339   Basic Metabolic Panel:  Recent Labs Lab 09/19/15 2212  NA 140  K 4.4  CL 102  CO2 27  GLUCOSE 118*  BUN 22*  CREATININE 0.75  CALCIUM 9.3   GFR: CrCl cannot be calculated (Unknown ideal weight.). Liver Function Tests: No results for input(s): AST, ALT, ALKPHOS, BILITOT, PROT, ALBUMIN in the last 168 hours. No results for input(s): LIPASE, AMYLASE in the last 168 hours. No results for input(s): AMMONIA in the last 168 hours. Coagulation Profile: No results for input(s): INR, PROTIME in the last 168 hours. Cardiac Enzymes: No results for input(s): CKTOTAL, CKMB, CKMBINDEX, TROPONINI in the last 168 hours. BNP (last 3 results) No results for input(s): PROBNP in the last 8760 hours. HbA1C: No results for input(s): HGBA1C in the last 72 hours. CBG: No results for input(s): GLUCAP in the last 168 hours. Lipid Profile: No results for input(s): CHOL, HDL, LDLCALC, TRIG, CHOLHDL, LDLDIRECT in the last 72 hours. Thyroid Function Tests: No results for input(s): TSH, T4TOTAL, FREET4, T3FREE, THYROIDAB in the last 72 hours. Anemia Panel: No results for input(s): VITAMINB12, FOLATE, FERRITIN, TIBC, IRON, RETICCTPCT in the last 72 hours. Urine analysis:    Component Value Date/Time    COLORURINE AMBER* 09/20/2015 0026   APPEARANCEUR CLEAR 09/20/2015 0026   LABSPEC 1.027 09/20/2015 0026   PHURINE 5.5 09/20/2015 0026   GLUCOSEU NEGATIVE 09/20/2015 0026   GLUCOSEU NEGATIVE 04/06/2014 1127   HGBUR NEGATIVE 09/20/2015 0026   BILIRUBINUR NEGATIVE 09/20/2015 0026   BILIRUBINUR small 03/14/2014 1151   KETONESUR NEGATIVE 09/20/2015 0026   PROTEINUR NEGATIVE 09/20/2015 0026   PROTEINUR trace 03/14/2014 1151   UROBILINOGEN 0.2 03/23/2015 0053   UROBILINOGEN 0.2 03/14/2014 1151   NITRITE NEGATIVE 09/20/2015 0026   NITRITE positive 03/14/2014 1151   LEUKOCYTESUR TRACE* 09/20/2015 0026   Sepsis Labs: (procalcitonin:4,lacticidven:4) ) Recent Results (from the past 240 hour(s))  Surgical pcr screen     Status: None   Collection Time: 09/20/15  5:26 AM  Result Value Ref Range Status   MRSA, PCR NEGATIVE NEGATIVE Final   Staphylococcus aureus NEGATIVE NEGATIVE Final    Comment:        The Xpert SA Assay (FDA approved for NASAL specimens in patients over 73 years of age), is one component of a comprehensive surveillance program.  Test performance has been validated by [facility] for patients greater than or equal to 65 year old. It is not intended to diagnose infection nor to guide or monitor treatment.      Radiological Exams on Admission: Chest Portable 1 View  09/20/2015  CLINICAL DATA:  Preop parenchymal EXAM: PORTABLE CHEST 1 VIEW COMPARISON:  09/19/2015 FINDINGS: Normal cardiac silhouette. Lungs are clear. Scoliosis noted. No acute osseous abnormality. IMPRESSION: 1. No acute cardiopulmonary findings. 2. Scoliosis. Electronically Signed   By: Genevive Bi M.D.   On: 09/20/2015 07:11   Dg Hip Unilat With Pelvis 2-3 Views Right  09/19/2015  CLINICAL DATA:  Dementia. Right hip pain, 3 days duration. No known injury. EXAM: DG HIP (WITH OR WITHOUT PELVIS) 2-3V RIGHT COMPARISON:  02/06/2015 FINDINGS: Complete femoral neck fracture on the right with  displacement of 1 cm. No other pelvic fracture.  Left hip appears normal. IMPRESSION: Complete femoral neck fracture on the right, displaced 1 cm. Electronically Signed   By: Paulina FusiMark  Shogry M.D.   On: 09/19/2015 22:15    EKG: Independently reviewed.  Assessment/Plan Principal Problem:   Displaced fracture of right femoral neck (HCC) Active Problems:   Hypertension   Dementia `   Displaced Femoral neck fracture -  Dr. Victorino DikeHewitt requests admit to hospital and he will evaluate later today he says.  Hip fx pathway  Will use very low dose PRN dilaudid for pain control  Gentle IVF to prevent dehydration  Spoke with son extensively regarding patients illness and likely need for operative repair.  Foley catheter due to hip fracture HTN - continue home meds Dementia - continue home meds    DVT prophylaxis: ASA 325 and SCDs for the moment, needs full anticoagulation post-op Code Status: DNR - this is verified with son Family Communication: Spoke with son on phone, number is in chart Consults called: Dr. Victorino DikeHewitt consulted for orthopaedics by EDP, will eval patient later today. Admission status: Inpatient   Hillary BowGARDNER, JARED M. DO [facility] Pager [phone] from 7PM-7AM  If 7AM-7PM, please contact the day physician for the patient [email or web address] Password [email or web address]  09/20/2015, 7:51 AM

## 2015-09-20 NOTE — Op Note (Addendum)
OPERATIVE REPORT  SURGEON: Samson FredericBrian Sherley Leser, MD   ASSISTANT: Dimitri PedAmber Constable, PA-C.  PREOPERATIVE DIAGNOSIS: Displaced Right femoral neck fracture.   POSTOPERATIVE DIAGNOSIS: Displaced Right femoral neck fracture.   PROCEDURE: Right hip hemiarthroplasty, anterior approach.   IMPLANTS: DePuy Tri Lock stem, size 7, std offset, with a -3 mm spacer and a 47 mm monopolar head ball.  ANESTHESIA:  Spinal  ANTIBIOTICS: 2g ancef.  ESTIMATED BLOOD LOSS: 200 mL.  DRAINS: None.  COMPLICATIONS: None   CONDITION: PACU - hemodynamically stable.   BRIEF CLINICAL NOTE: Deborah CorpusStella P Finkbiner is a 82 y.o. female with a displaced Right femoral neck fracture. The patient was admitted to the hospitalist service and underwent perioperative risk stratification and medical optimization. The risks, benefits, and alternatives to hemiarthroplasty were explained, and the patient elected to proceed.  PROCEDURE IN DETAIL: The patient was taken to the operating room and general anesthesia was induced on the hospital bed. The patient was then positioned on the Hana table. All bony prominences were well padded. The hip was prepped and draped in the normal sterile surgical fashion. A time-out was called verifying side and site of surgery. Antibiotics were given within 60 minutes of beginning the procedure.  The direct anterior approach to the hip was performed through the Hueter interval. Lateral femoral circumflex vessels were treated with the Auqumantys. The anterior capsule was exposed and an inverted T capsulotomy was made. Fracture hematoma was encountered and evacuated. The patient was found to have a comminuted Right subcapital femoral neck fracture. I freshened the femoral neck cut with a saw. I removed the femoral neck fragment. A corkscrew was placed into the head and the head was removed. This was passed to the back table and was measured.  Acetabular exposure was achieved. I examined the  articular cartilage which was intact. The labrum was intact. A 47 mm trial head was placed and found to have excellent fit.  I then gained femoral exposure taking care to protect the abductors and greater trochanter. This was performed using standard external rotation, extension, and adduction. The capsule was peeled off the inner aspect of the greater trochanter, taking care to preserve the short external rotators. A cookie cutter was used to enter the femoral canal, and then the femoral canal finder was used to confirm location. I then sequentially broached up to a size 7. Calcar planer was used on the femoral neck remnant. I paced a std neck and a 36+ 0 head ball.The hip was reduced. Leg lengths were checked fluoroscopically. The hip was dislocated and trial components were removed. I placed the real stem followed by the real spacer and head ball. A single reduction maneuver was performed and the hip was reduced. Fluoroscopy was used to confirm component position and leg lengths. At 90 degrees of external rotation and extension, the hip was stable to an anterior directed force.  The wound was copiously irrigated with normal saline solution. Marcaine solution was injected into the periarticular soft tissue. The wound was closed in layers using #1 Vicryl and V-Loc for the fascia, 2-0 Vicryl for the subcutaneous fat, 2-0 Monocryl for the deep dermal layer, 3-0 running Monocryl subcuticular stitch and glue for the skin. Once the glue was fully dried, an Aquacell Ag dressing was applied. The patient was then awakened from anesthesia and transported to the recovery room in stable condition. Sponge, needle, and instrument counts were correct at the end of the case x2. The patient tolerated the procedure well and there were no  known complications.  Please note that a surgical assistant was a medical necessity for this procedure to perform it in a safe and expeditious manner. Assistant was  necessary to provide appropriate retraction of vital neurovascular structures, to prevent femoral fracture, and to allow for anatomic placement of the prosthesis.

## 2015-09-20 NOTE — Progress Notes (Signed)
PROGRESS NOTE  [name]  [name]:[id number] DOB: 1921/04/28  DOA: 09/19/2015 PCP: Danise Edge, MD  Outpatient Specialists:  Not known  Brief Narrative:  82 year old female patient, NH resident, ambulates with assistance, PMH of dementia, HTN, chronic kidney disease, hypothyroid, valvular heart disease/heart murmur, presented to ED from [facility] house memory care unit due to 3 days history of right lower extremity pain in the absence of any reported recent falls. Fever of 100.39F with EMS. Admitted for displaced right femoral neck fracture. Orthopedics consulted and plan surgery later today.   Assessment & Plan:   Principal Problem:   Displaced fracture of right femoral neck (HCC) Active Problems:   Hypertension   Dementia   Pressure ulcer   Displaced fracture of right femoral neck - No report of fall or trauma. - Orthopedics have consulted and plan OR for right hip hemiarthroplasty later on 4/20. - Based on available data, patient is at least moderate risk for perioperative CV events given advanced age and frail status. No reported history of CAD, CHF, CVD, DM. Patient is a poor historian but denies dyspnea or chest pain. May proceed with indicated surgery without any further cardiac workup with usual close perioperative monitoring. Admitting M.D. has discussed with patient's son extensively.  Essential hypertension - Reasonable inpatient control. Not on medications at [facility].  Hypothyroid - Continue Synthroid.  Dementia - No agitation at this time. Continue risperidone, clonazepam and Lexapro.  Valvular heart disease - 2-D echo 10/03/13: LVEF 60-65 percent, moderate AR and MR.  Anemia - Follow CBCs postop.   DVT prophylaxis: SCD's. Postop as per orthopedic service. Code Status: DO NOT RESUSCITATE Family Communication: Admitting M.D. discussed with patient's son in detail. No family at bedside. Disposition Plan: SNF when medically stable.   Consultants:    Orthopedics  Procedures:   Foley catheter   Antimicrobials:   None    Subjective: Complains of intermittent mild right lower extremity pain, worse on movement. Denies dyspnea, chest pain or palpitations. As per RN, no acute issues.  Objective:  Filed Vitals:   09/19/15 2300 09/20/15 0050 09/20/15 0553 09/20/15 1346  BP: 159/62 163/52 141/53 132/57  Pulse: 80 81 64 78  Temp:  99.1 F (37.3 C) 99.5 F (37.5 C) 98.8 F (37.1 C)  TempSrc:  Axillary Axillary Oral  Resp: Weight:  80.6 kg (177 lb 11.1 oz)    SpO2: 95% 95% 95% 95%    Intake/Output Summary (Last 24 hours) at 09/20/15 1453 Last data filed at 09/20/15 1244  Gross per 24 hour  Intake      0 ml  Output   1200 ml  Net  -1200 ml   Filed Weights   09/20/15 0050  Weight: 80.6 kg (177 lb 11.1 oz)    Examination:  General exam: Pleasant elderly female lying comfortably supine in bed.   Respiratory system: Clear to auscultation. Respiratory effort normal. Cardiovascular system: S1 & S2 heard, RRR. No JVD, murmurs, rubs, gallops or clicks. No pedal edema. Gastrointestinal system: Abdomen is nondistended, soft and nontender. No organomegaly or masses felt. Normal bowel sounds heard. Foley catheter +. Central nervous system: Alert and oriented to self and partly to place. No focal neurological deficits. Extremities: Moving all limbs well except right lower extremity-limited secondary to pain. Skin: No rashes, lesions or ulcers Psychiatry: Judgement and insight appear normal. Mood & affect appropriate.     Data Reviewed: I have personally reviewed following labs and imaging studies  CBC:  Recent Labs Lab 09/19/15 2212  WBC 16.6*  NEUTROABS 13.6*  HGB 11.9*  HCT 35.2*  MCV 84.8  PLT 339   Basic Metabolic Panel:  Recent Labs Lab 09/19/15 2212  NA 140  K 4.4  CL 102  CO2 27  GLUCOSE 118*  BUN 22*  CREATININE 0.75  CALCIUM 9.3   GFR: CrCl cannot be calculated (Unknown ideal  weight.). Liver Function Tests: No results for input(s): AST, ALT, ALKPHOS, BILITOT, PROT, ALBUMIN in the last 168 hours. No results for input(s): LIPASE, AMYLASE in the last 168 hours. No results for input(s): AMMONIA in the last 168 hours. Coagulation Profile: No results for input(s): INR, PROTIME in the last 168 hours. Cardiac Enzymes: No results for input(s): CKTOTAL, CKMB, CKMBINDEX, TROPONINI in the last 168 hours. BNP (last 3 results) No results for input(s): PROBNP in the last 8760 hours. HbA1C: No results for input(s): HGBA1C in the last 72 hours. CBG: No results for input(s): GLUCAP in the last 168 hours. Lipid Profile: No results for input(s): CHOL, HDL, LDLCALC, TRIG, CHOLHDL, LDLDIRECT in the last 72 hours. Thyroid Function Tests: No results for input(s): TSH, T4TOTAL, FREET4, T3FREE, THYROIDAB in the last 72 hours. Anemia Panel: No results for input(s): VITAMINB12, FOLATE, FERRITIN, TIBC, IRON, RETICCTPCT in the last 72 hours. Urine analysis:    Component Value Date/Time   COLORURINE AMBER* 09/20/2015 0026   APPEARANCEUR CLEAR 09/20/2015 0026   LABSPEC 1.027 09/20/2015 0026   PHURINE 5.5 09/20/2015 0026   GLUCOSEU NEGATIVE 09/20/2015 0026   GLUCOSEU NEGATIVE 04/06/2014 1127   HGBUR NEGATIVE 09/20/2015 0026   BILIRUBINUR NEGATIVE 09/20/2015 0026   BILIRUBINUR small 03/14/2014 1151   KETONESUR NEGATIVE 09/20/2015 0026   PROTEINUR NEGATIVE 09/20/2015 0026   PROTEINUR trace 03/14/2014 1151   UROBILINOGEN 0.2 03/23/2015 0053   UROBILINOGEN 0.2 03/14/2014 1151   NITRITE NEGATIVE 09/20/2015 0026   NITRITE positive 03/14/2014 1151   LEUKOCYTESUR TRACE* 09/20/2015 0026   Sepsis Labs: @LABRCNTIP (procalcitonin:4,lacticidven:4)  ) Recent Results (from the past 240 hour(s))  Surgical pcr screen     Status: None   Collection Time: 09/20/15  5:26 AM  Result Value Ref Range Status   MRSA, PCR NEGATIVE NEGATIVE Final   Staphylococcus aureus NEGATIVE NEGATIVE Final     Comment:        The Xpert SA Assay (FDA approved for NASAL specimens in patients over 80 years of age), is one component of a comprehensive surveillance program.  Test performance has been validated by [facility] for patients greater than or equal to 80 year old. It is not intended to diagnose infection nor to guide or monitor treatment.          Radiology Studies: Chest Portable 1 View  09/20/2015  CLINICAL DATA:  Preop parenchymal EXAM: PORTABLE CHEST 1 VIEW COMPARISON:  09/19/2015 FINDINGS: Normal cardiac silhouette. Lungs are clear. Scoliosis noted. No acute osseous abnormality. IMPRESSION: 1. No acute cardiopulmonary findings. 2. Scoliosis. Electronically Signed   By: Genevive BiStewart  Edmunds M.D.   On: 09/20/2015 07:11   Dg Knee Right Port  09/20/2015  CLINICAL DATA:  Fall 3 days ago.  RIGHT diffuse knee pain EXAM: PORTABLE RIGHT KNEE - 1-2 VIEW COMPARISON:  02/06/2015 FINDINGS: No fracture of the proximal tibia or distal femur. Patella is normal. No joint effusion. There is and a calcification within the medial lateral compartments consists with chondrocalcinosis. IMPRESSION: 1. No acute findings of the RIGHT knee. 2. Chondrocalcinosis, unchanged. Electronically Signed   By: Loura HaltStewart  Edmunds M.D.  On: 09/20/2015 08:44   Dg Hip Unilat With Pelvis 2-3 Views Right  09/19/2015  CLINICAL DATA:  Dementia. Right hip pain, 3 days duration. No known injury. EXAM: DG HIP (WITH OR WITHOUT PELVIS) 2-3V RIGHT COMPARISON:  02/06/2015 FINDINGS: Complete femoral neck fracture on the right with displacement of 1 cm. No other pelvic fracture. Left hip appears normal. IMPRESSION: Complete femoral neck fracture on the right, displaced 1 cm. Electronically Signed   By: Paulina Fusi M.D.   On: 09/19/2015 22:15        Scheduled Meds: . aspirin EC  325 mg Oral Daily  . clonazePAM  0.5 mg Oral BID  . escitalopram  5 mg Oral Daily  . levothyroxine  50 mcg Oral QAC breakfast  . risperiDONE  0.5 mg Oral  QHS  . rivastigmine  3 mg Oral BID  . senna-docusate  1 tablet Oral BID   Continuous Infusions: . sodium chloride 75 mL/hr at 09/20/15 0206     LOS: 1 day    Time spent: 35 minutes.    [facility], MD [facility] Pager [phone]  If 7PM-7AM, please contact night-coverage [email or web address] Password [facility] 09/20/2015, 2:53 PM

## 2015-09-21 ENCOUNTER — Encounter (HOSPITAL_COMMUNITY): Payer: Self-pay | Admitting: Orthopedic Surgery

## 2015-09-21 DIAGNOSIS — D62 Acute posthemorrhagic anemia: Secondary | ICD-10-CM

## 2015-09-21 DIAGNOSIS — Z9660 Presence of unspecified orthopedic joint implant: Secondary | ICD-10-CM

## 2015-09-21 DIAGNOSIS — I10 Essential (primary) hypertension: Secondary | ICD-10-CM

## 2015-09-21 LAB — CBC
HCT: 29.4 % — ABNORMAL LOW (ref 36.0–46.0)
HEMOGLOBIN: 10 g/dL — AB (ref 12.0–15.0)
MCH: 29.2 pg (ref 26.0–34.0)
MCHC: 34 g/dL (ref 30.0–36.0)
MCV: 86 fL (ref 78.0–100.0)
Platelets: 310 10*3/uL (ref 150–400)
RBC: 3.42 MIL/uL — AB (ref 3.87–5.11)
RDW: 12.9 % (ref 11.5–15.5)
WBC: 15.1 10*3/uL — ABNORMAL HIGH (ref 4.0–10.5)

## 2015-09-21 LAB — BASIC METABOLIC PANEL
Anion gap: 7 (ref 5–15)
BUN: 21 mg/dL — AB (ref 6–20)
CHLORIDE: 104 mmol/L (ref 101–111)
CO2: 29 mmol/L (ref 22–32)
Calcium: 8.6 mg/dL — ABNORMAL LOW (ref 8.9–10.3)
Creatinine, Ser: 0.91 mg/dL (ref 0.44–1.00)
GFR calc Af Amer: >60 mL/min (ref >60)
GFR calc non Af Amer: 53 mL/min — ABNORMAL LOW (ref >60)
GLUCOSE: 128 mg/dL — AB (ref 65–99)
POTASSIUM: 3.9 mmol/L (ref 3.5–5.1)
Sodium: 140 mmol/L (ref 135–145)

## 2015-09-21 NOTE — Evaluation (Signed)
Physical Therapy Evaluation Patient Details Name: [name] MRN: [id number] DOB: [birth year] Today's Date: 09/21/2015   History of Present Illness  82 yo F adm with displaced R femoral neck fracture. PMH dementia, HTN, chronic kidney disease, hypothyroid, valvular heart disease/heart murmur. S/p Right hip hemiarthroplasty, anterior approach on 09/20/15.   Clinical Impression  Pt s/p R hip arthroplasty presents with functional mobility limitations 2* generalized weakness, ltd ROM strength/R LE, post op pain and ongoing cognitive deficits.  Pt will benefit from follow up rehab at SNF level.    Follow Up Recommendations SNF    Equipment Recommendations  None recommended by PT    Recommendations for Other Services       Precautions / Restrictions Precautions Precautions: Fall Restrictions Weight Bearing Restrictions: No Other Position/Activity Restrictions: WBAT      Mobility  Bed Mobility Overal bed mobility: Needs Assistance;+2 for physical assistance Bed Mobility: Supine to Sit     Supine to sit: Max assist;+2 for physical assistance;+2 for safety/equipment;HOB elevated     General bed mobility comments: cues for sequence and use of L LE to self assist.  Pad utilized to bring pt to EOB  Transfers Overall transfer level: Needs assistance Equipment used: Rolling walker (2 wheeled) Transfers: Sit to/from Stand Sit to Stand: Mod assist;+2 physical assistance;+2 safety/equipment;From elevated surface         General transfer comment: cues for LE management and use of UEs to self assist  Ambulation/Gait Ambulation/Gait assistance: Min assist;Mod assist;+2 physical assistance;+2 safety/equipment Ambulation Distance (Feet): 10 Feet Assistive device: Rolling walker (2 wheeled) Gait Pattern/deviations: Step-to pattern;Shuffle;Decreased step length - right;Decreased step length - left;Trunk flexed Gait velocity: decr   General Gait Details: cues for sequence, posture and  position from [employer]            Wheelchair Mobility    Modified Rankin (Stroke Patients Only)       Balance Overall balance assessment: Needs assistance Sitting-balance support: Feet supported;Bilateral upper extremity supported Sitting balance-Leahy Scale: Fair     Standing balance support: Bilateral upper extremity supported Standing balance-Leahy Scale: Poor                               Pertinent Vitals/Pain Pain Assessment: Faces Faces Pain Scale: Hurts even more Pain Location: R hip/thigh Pain Descriptors / Indicators: Grimacing;Sore Pain Intervention(s): Limited activity within patient's tolerance;Monitored during session;Repositioned;Ice applied    Home Living Family/patient expects to be discharged to:: Skilled nursing facility                      Prior Function Level of Independence: Needs assistance         Comments: From previous notes, it appears pt with h/o falls, needs supervision/assistance ambulation with AD, needs assistance with ADLs.     Hand Dominance   Dominant Hand: Right    Extremity/Trunk Assessment   Upper Extremity Assessment: Generalized weakness           Lower Extremity Assessment: Generalized weakness;RLE deficits/detail         Communication   Communication: No difficulties  Cognition Arousal/Alertness: Awake/alert Behavior During Therapy: WFL for tasks assessed/performed Overall Cognitive Status: History of cognitive impairments - at baseline                      General Comments      Exercises  Assessment/Plan    PT Assessment Patient needs continued PT services  PT Diagnosis Difficulty walking   PT Problem List Decreased strength;Decreased range of motion;Decreased activity tolerance;Decreased balance;Decreased knowledge of use of DME;Pain;Decreased cognition  PT Treatment Interventions DME instruction;Gait training;Functional mobility training;Therapeutic  activities;Therapeutic exercise;Balance training;Patient/family education   PT Goals (Current goals can be found in the Care Plan section) Acute Rehab PT Goals Patient Stated Goal: to get back to work PT Goal Formulation: Patient unable to participate in goal setting Time For Goal Achievement: 09/28/15 Potential to Achieve Goals: Fair    Frequency Min 3X/week   Barriers to discharge        Co-evaluation PT/OT/SLP Co-Evaluation/Treatment: Yes Reason for Co-Treatment: For patient/therapist safety PT goals addressed during session: Mobility/safety with mobility OT goals addressed during session: ADL's and self-care       End of Session Equipment Utilized During Treatment: Gait belt Activity Tolerance: Patient tolerated treatment well;Patient limited by fatigue Patient left: in chair;with call bell/phone within reach;with chair alarm set Nurse Communication: Mobility status         Time: 1039-1105 PT Time Calculation (min) (ACUTE ONLY): 26 min   Charges:   PT Evaluation $PT Eval Low Complexity: 1 Procedure     PT G Codes:        Sherri Levenhagen 10-01-2015, 1:10 PM

## 2015-09-21 NOTE — Progress Notes (Signed)
   Subjective:  Patient reports pain as mild to moderate.  No c/o.  Objective:   VITALS:   Filed Vitals:   09/20/15 2253 09/20/15 2339 09/21/15 0258 09/21/15 0606  BP: 146/66 145/65 131/55 139/58  Pulse: 72 72 62 64  Temp: 97.8 F (36.6 C) 98.3 F (36.8 C) 98.5 F (36.9 C) 98.7 F (37.1 C)  TempSrc: Oral Oral Axillary Axillary  Resp: 16 16 16 16   Weight:      SpO2: 100% 100% 100% 100%    ABD soft Sensation intact distally Intact pulses distally Dorsiflexion/Plantar flexion intact Incision: dressing C/D/I Compartment soft   Lab Results  Component Value Date   WBC 15.1* 09/21/2015   HGB 10.0* 09/21/2015   HCT 29.4* 09/21/2015   MCV 86.0 09/21/2015   PLT 310 09/21/2015   BMET    Component Value Date/Time   NA 140 09/21/2015 0404   K 3.9 09/21/2015 0404   CL 104 09/21/2015 0404   CO2 29 09/21/2015 0404   GLUCOSE 128* 09/21/2015 0404   BUN 21* 09/21/2015 0404   CREATININE 0.91 09/21/2015 0404   CREATININE 0.90 01/26/2014 0827   CALCIUM 8.6* 09/21/2015 0404   GFRNONAA 53* 09/21/2015 0404   GFRAA >60 09/21/2015 0404     Assessment/Plan: 1 Day Post-Op   Principal Problem:   Displaced fracture of right femoral neck (HCC) Active Problems:   Hypertension   Dementia   Pressure ulcer   WBAT with walker DVT ppx: ASA 325 mg PO BID x30 days, PO pain control PT/OT D/C planning   Tamira Ryland, Cloyde ReamsBrian James 09/21/2015, 7:09 AM   Samson FredericBrian Horrace Hanak, MD Cell [phone]

## 2015-09-21 NOTE — Progress Notes (Signed)
PROGRESS NOTE  [name]  BMW:[id number] DOB: [birth year]  DOA: 09/19/2015 PCP: Danise Edge, MD  Outpatient Specialists:  Not known  Brief Narrative:  82 year old female patient, NH resident, ambulates with assistance, PMH of dementia, HTN, chronic kidney disease, hypothyroid, valvular heart disease/heart murmur, presented to ED from [facility] house memory care unit due to 3 days history of right lower extremity pain in the absence of any reported recent falls. Fever of 100.94F with EMS. Admitted for displaced right femoral neck fracture. Orthopedics consulted and s/p right hip hemiarthroplasty on 4/20. DC to SNF when medically stable.   Assessment & Plan:   Principal Problem:   Displaced fracture of right femoral neck (HCC) Active Problems:   Hypertension   Dementia   Pressure ulcer   Displaced fracture of right femoral neck - No report of fall or trauma. - Orthopedics were consulted and she underwent right hip hemiarthroplasty on 4/20. - As per orthopedic follow-up, WBAT with walker, ASA 325 MG PO BID 30 days for DVT prophylaxis.  Essential hypertension - Reasonable inpatient control. Not on medications at [facility].  Hypothyroid - Continue Synthroid.  Dementia - No agitation at this time. Continue risperidone, clonazepam and Lexapro.  Valvular heart disease - 2-D echo 10/03/13: LVEF 60-65 percent, moderate AR and MR.  Acute blood loss anemia complicating chronic anemia - Baseline hemoglobin probably in the 11 g range. Dropped to 10 g postop. Follow CBC in a.m. Transfuse if hemoglobin <7 g per DL.   DVT prophylaxis: SCD's. Postop as per orthopedic service. Code Status: DO NOT RESUSCITATE Family Communication: Admitting M.D. discussed with patient's son in detail. No family at bedside. Disposition Plan: SNF when medically stable.   Consultants:   Orthopedics  Procedures:   Foley catheter   Right hip hemiarthroplasty 4/20  Antimicrobials:   None     Subjective: Pleasantly confused. Did not know where she was this morning. Denies pain. As per RN, no acute issues.  Objective:  Filed Vitals:   09/21/15 0258 09/21/15 0606 09/21/15 1032 09/21/15 1357  BP: 131/55 139/58 139/46 135/45  Pulse: 62 64 70 65  Temp: 98.5 F (36.9 C) 98.7 F (37.1 C) 98.1 F (36.7 C) 97.6 F (36.4 C)  TempSrc: Axillary Axillary Oral Oral  Resp: 16 16 16 16   Height:   5\' 5"  (1.651 m)   Weight:      SpO2: 100% 100% 99% 94%    Intake/Output Summary (Last 24 hours) at 09/21/15 1601 Last data filed at 09/21/15 1400  Gross per 24 hour  Intake   2085 ml  Output    810 ml  Net   1275 ml   Filed Weights   09/20/15 0050  Weight: 80.6 kg (177 lb 11.1 oz)    Examination:  General exam: Pleasant elderly female lying comfortably supine in bed.   Respiratory system: Clear to auscultation. Respiratory effort normal. Cardiovascular system: S1 & S2 heard, RRR. No JVD, murmurs, rubs, gallops or clicks. No pedal edema. Gastrointestinal system: Abdomen is nondistended, soft and nontender. No organomegaly or masses felt. Normal bowel sounds heard. Foley catheter +. Central nervous system: Alert and oriented to self only. No focal neurological deficits. Extremities: Moving all limbs well except right lower extremity-limited secondary to pain. Right hip postop site dressing clean and dry. Skin: No rashes, lesions or ulcers    Data Reviewed: I have personally reviewed following labs and imaging studies  CBC:  Recent Labs Lab 09/19/15 2212 09/21/15 0404  WBC 16.6* 15.1*  NEUTROABS 13.6*  --   HGB 11.9* 10.0*  HCT 35.2* 29.4*  MCV 84.8 86.0  PLT 339 310   Basic Metabolic Panel:  Recent Labs Lab 09/19/15 2212 09/21/15 0404  NA 140 140  K 4.4 3.9  CL 102 104  CO2 27 29  GLUCOSE 118* 128*  BUN 22* 21*  CREATININE 0.75 0.91  CALCIUM 9.3 8.6*   GFR: Estimated Creatinine Clearance: 40.5 mL/min (by C-G formula based on Cr of 0.91). Liver  Function Tests: No results for input(s): AST, ALT, ALKPHOS, BILITOT, PROT, ALBUMIN in the last 168 hours. No results for input(s): LIPASE, AMYLASE in the last 168 hours. No results for input(s): AMMONIA in the last 168 hours. Coagulation Profile: No results for input(s): INR, PROTIME in the last 168 hours. Cardiac Enzymes: No results for input(s): CKTOTAL, CKMB, CKMBINDEX, TROPONINI in the last 168 hours. BNP (last 3 results) No results for input(s): PROBNP in the last 8760 hours. HbA1C: No results for input(s): HGBA1C in the last 72 hours. CBG: No results for input(s): GLUCAP in the last 168 hours. Lipid Profile: No results for input(s): CHOL, HDL, LDLCALC, TRIG, CHOLHDL, LDLDIRECT in the last 72 hours. Thyroid Function Tests: No results for input(s): TSH, T4TOTAL, FREET4, T3FREE, THYROIDAB in the last 72 hours. Anemia Panel: No results for input(s): VITAMINB12, FOLATE, FERRITIN, TIBC, IRON, RETICCTPCT in the last 72 hours. Urine analysis:    Component Value Date/Time   COLORURINE AMBER* 09/20/2015 0026   APPEARANCEUR CLEAR 09/20/2015 0026   LABSPEC 1.027 09/20/2015 0026   PHURINE 5.5 09/20/2015 0026   GLUCOSEU NEGATIVE 09/20/2015 0026   GLUCOSEU NEGATIVE 04/06/2014 1127   HGBUR NEGATIVE 09/20/2015 0026   BILIRUBINUR NEGATIVE 09/20/2015 0026   BILIRUBINUR small 03/14/2014 1151   KETONESUR NEGATIVE 09/20/2015 0026   PROTEINUR NEGATIVE 09/20/2015 0026   PROTEINUR trace 03/14/2014 1151   UROBILINOGEN 0.2 03/23/2015 0053   UROBILINOGEN 0.2 03/14/2014 1151   NITRITE NEGATIVE 09/20/2015 0026   NITRITE positive 03/14/2014 1151   LEUKOCYTESUR TRACE* 09/20/2015 0026    Recent Results (from the past 240 hour(s))  Surgical pcr screen     Status: None   Collection Time: 09/20/15  5:26 AM  Result Value Ref Range Status   MRSA, PCR NEGATIVE NEGATIVE Final   Staphylococcus aureus NEGATIVE NEGATIVE Final    Comment:        The Xpert SA Assay (FDA approved for NASAL specimens in  patients over 35 years of age), is one component of a comprehensive surveillance program.  Test performance has been validated by [facility] for patients greater than or equal to 23 year old. It is not intended to diagnose infection nor to guide or monitor treatment.          Radiology Studies: Pelvis Portable  09/20/2015  CLINICAL DATA:  Postoperative right hip replacement EXAM: DG C-ARM 1-60 MIN-NO REPORT; PORTABLE PELVIS 1-2 VIEWS COMPARISON:  Intraoperative fluoroscopic images 4 2017. Pelvis 06/08/2015 FINDINGS: Right hip hemiarthroplasty with non cemented component. Components appear well seated. No evidence of acute fracture or dislocation. Soft tissue emphysema consistent with recent surgery. Degenerative changes in the left hip. IMPRESSION: Right hip hemiarthroplasty.  Components appear well seated. Electronically Signed   By: Burman Nieves M.D.   On: 09/20/2015 21:21   Chest Portable 1 View  09/20/2015  CLINICAL DATA:  Preop parenchymal EXAM: PORTABLE CHEST 1 VIEW COMPARISON:  09/19/2015 FINDINGS: Normal cardiac silhouette. Lungs are clear. Scoliosis noted. No acute osseous abnormality. IMPRESSION: 1. No acute cardiopulmonary  findings. 2. Scoliosis. Electronically Signed   By: Genevive Bi M.D.   On: 09/20/2015 07:11   Dg Knee Right Port  09/20/2015  CLINICAL DATA:  Fall 3 days ago.  RIGHT diffuse knee pain EXAM: PORTABLE RIGHT KNEE - 1-2 VIEW COMPARISON:  02/06/2015 FINDINGS: No fracture of the proximal tibia or distal femur. Patella is normal. No joint effusion. There is and a calcification within the medial lateral compartments consists with chondrocalcinosis. IMPRESSION: 1. No acute findings of the RIGHT knee. 2. Chondrocalcinosis, unchanged. Electronically Signed   By: Genevive Bi M.D.   On: 09/20/2015 08:44   Dg C-arm 1-60 Min-no Report  09/20/2015  CLINICAL DATA: surgery C-ARM 1-60 MINUTES Fluoroscopy was utilized by the requesting physician.  No radiographic  interpretation.   Dg Hip Operative Unilat With Pelvis Right  09/20/2015  CLINICAL DATA:  RIGHT total hip arthroplasty EXAM: OPERATIVE RIGHT HIP (WITH PELVIS IF PERFORMED) 2 VIEWS TECHNIQUE: Fluoroscopic spot image(s) were submitted for interpretation post-operatively. COMPARISON:  02/16/2015 FLUOROSCOPY TIME:  0 minutes 8 seconds Images obtained:  2 Exposure:  0.87 mGy FINDINGS: RIGHT hip prosthesis identified in expected position. Bones appear demineralized. No fracture dislocation seen. IMPRESSION: RIGHT hip prosthesis without acute fracture or dislocation. Electronically Signed   By: Ulyses Southward M.D.   On: 09/20/2015 20:31   Dg Hip Unilat With Pelvis 2-3 Views Right  09/19/2015  CLINICAL DATA:  Dementia. Right hip pain, 3 days duration. No known injury. EXAM: DG HIP (WITH OR WITHOUT PELVIS) 2-3V RIGHT COMPARISON:  02/06/2015 FINDINGS: Complete femoral neck fracture on the right with displacement of 1 cm. No other pelvic fracture. Left hip appears normal. IMPRESSION: Complete femoral neck fracture on the right, displaced 1 cm. Electronically Signed   By: Paulina Fusi M.D.   On: 09/19/2015 22:15        Scheduled Meds: . acetaminophen  1,000 mg Oral Q6H  . aspirin EC  325 mg Oral BID PC  . clonazePAM  0.5 mg Oral BID  . docusate sodium  100 mg Oral BID  . escitalopram  5 mg Oral Daily  . levothyroxine  50 mcg Oral QAC breakfast  . risperiDONE  0.5 mg Oral QHS  . rivastigmine  3 mg Oral BID  . senna  1 tablet Oral BID  . senna-docusate  1 tablet Oral BID   Continuous Infusions:     LOS: 2 days    Time spent: 25 minutes.    [facility], MD [facility] Pager [phone]  If 7PM-7AM, please contact night-coverage [email or web address] Password [facility] 09/21/2015, 4:01 PM

## 2015-09-21 NOTE — Plan of Care (Signed)
Problem: Education: Goal: Verbalization of understanding the information provided (i.e., activity precautions, restrictions, etc) will improve Outcome: Not Met (add Reason) History of dementia  Problem: Health Behavior/Discharge Planning: Goal: Identification of resources available to assist in meeting health care needs will improve Outcome: Not Met (add Reason) History of dementia

## 2015-09-21 NOTE — Progress Notes (Signed)
Occupational Therapy Evaluation Patient Details Name: [name] MRN: [id number] DOB: [birth year] Today's Date: 09/21/2015    History of Present Illness 82 yo F adm with displaced R femoral neck fracture. PMH dementia, HTN, chronic kidney disease, hypothyroid, valvular heart disease/heart murmur. S/p Right hip hemiarthroplasty, anterior approach on 09/20/15.    Clinical Impression   Patient presents to OT with decreased ADL independence and safety due to the above diagnoses and the deficits listed below. All further OT needs can be met in next venue of care. OT will sign off.    Follow Up Recommendations  SNF    Equipment Recommendations  Other (comment) (tbd next venue of care)    Recommendations for Other Services       Precautions / Restrictions Precautions Precautions: Fall Restrictions Other Position/Activity Restrictions: WBAT      Mobility Bed Mobility Overal bed mobility: Needs Assistance;+2 for physical assistance Bed Mobility: Supine to Sit     Supine to sit: Max assist;+2 for physical assistance;+2 for safety/equipment;HOB elevated        Transfers Overall transfer level: Needs assistance Equipment used: Rolling walker (2 wheeled) Transfers: Sit to/from Stand Sit to Stand: Mod assist;+2 physical assistance;+2 safety/equipment;From elevated surface              Balance                                            ADL Overall ADL's : Needs assistance/impaired Eating/Feeding: Set up;Sitting   Grooming: Wash/dry hands;Wash/dry face;Set up;Sitting   Upper Body Bathing: Maximal assistance;Total assistance;Bed level   Lower Body Bathing: Total assistance;Bed level   Upper Body Dressing : Maximal assistance;Total assistance;Bed level   Lower Body Dressing: Total assistance;Bed level   Toilet Transfer: Moderate assistance;+2 for physical assistance;+2 for safety/equipment;Stand-pivot;BSC;RW   Toileting- Clothing Manipulation  and Hygiene: Total assistance;+2 for safety/equipment;Sit to/from stand       Functional mobility during ADLs: Moderate assistance;+2 for physical assistance;+2 for safety/equipment;Rolling walker       Vision     Perception     Praxis      Pertinent Vitals/Pain Pain Assessment: Faces Faces Pain Scale: Hurts even more Pain Location: R hip/thigh Pain Descriptors / Indicators: Sore;Grimacing Pain Intervention(s): Limited activity within patient's tolerance;Monitored during session;Repositioned     Hand Dominance Right   Extremity/Trunk Assessment Upper Extremity Assessment Upper Extremity Assessment: Generalized weakness   Lower Extremity Assessment Lower Extremity Assessment: Defer to PT evaluation       Communication Communication Communication: No difficulties   Cognition Arousal/Alertness: Awake/alert Behavior During Therapy: WFL for tasks assessed/performed Overall Cognitive Status: History of cognitive impairments - at baseline                     General Comments       Exercises       Shoulder Instructions      Home Living Family/patient expects to be discharged to:: Skilled nursing facility                                        Prior Functioning/Environment Level of Independence: Needs assistance        Comments: From previous notes, it appears pt with h/o falls, needs supervision/assistance ambulation with AD, needs assistance with ADLs.  OT Diagnosis: Acute pain;Generalized weakness   OT Problem List: Decreased strength;Decreased range of motion;Decreased activity tolerance;Impaired balance (sitting and/or standing);Decreased safety awareness;Decreased knowledge of use of DME or AE;Decreased knowledge of precautions;Pain   OT Treatment/Interventions:      OT Goals(Current goals can be found in the care plan section) Acute Rehab OT Goals Patient Stated Goal: to get back to work OT Goal Formulation: All assessment  and education complete, DC therapy  OT Frequency:     Barriers to D/C:            Co-evaluation PT/OT/SLP Co-Evaluation/Treatment: Yes Reason for Co-Treatment: For patient/therapist safety PT goals addressed during session: Mobility/safety with mobility OT goals addressed during session: ADL's and self-care      End of Session Equipment Utilized During Treatment: Gait belt;Rolling walker Nurse Communication: Mobility status  Activity Tolerance: Patient tolerated treatment well Patient left: in chair;with call bell/phone within reach;with chair alarm set   Time: 1039-1105 OT Time Calculation (min): 26 min Charges:  OT General Charges $OT Visit: 1 Procedure OT Evaluation $OT Eval Moderate Complexity: 1 Procedure G-Codes:    [name] A 10/06/2015, 12:36 PM

## 2015-09-21 NOTE — Clinical Social Work Note (Signed)
CSW [facility] Librarian, academicxecutive Director at [facility].  CSW informed her that PT is recommending SNF for STR.  Brook recommended that pt be sent to SNF before returning to ALF.    CSW spoke to pt's son/POA and explained PT recommendations.  Son stated he is not familiar with the SNF and was not able to provide a preference.   CSW explained that a SNF search would be initiated and bed offers will be extended to son once received.   CSW completed FL2 and initiated a bed search for SNF.  CSW initiated PASARR screening.  Manual screening is in progress.  CSW will continue to follow and assist with d/c planning needs.  HendersonvilleLynn Kerria Sapien, [place] [phone]

## 2015-09-21 NOTE — Clinical Social Work Note (Signed)
Clinical Social Work Assessment  Patient Details  Name: [name] MRN: [id number] Date of Birth: [birth year]  Date of referral:  09/20/15               Reason for consult:  Discharge Planning, Facility Placement                Permission sought to share information with:    Permission granted to share information::     Name::        Agency::     Relationship::     Contact Information:     Housing/Transportation Living arrangements for the past 2 months:  Assisted Living Facility Source of Information:  Adult Children ([name], [name]) Patient Interpreter Needed:  None Criminal Activity/Legal Involvement Pertinent to Current Situation/Hospitalization:  No - Comment as needed Significant Relationships:  Adult Children Lives with:  Facility Resident Do you feel safe going back to the place where you live?  Yes Need for family participation in patient care:  Yes (Comment)  Care giving concerns:  [name] is a memory care resident.     Social Worker assessment / plan:  CSW spoke to [name]'s [name], [name], by phone since [name] is disoriented.  CSW introduced self and explained role of CSW.  [name] explained that [name].  Prior to moving to ALF [name] [place] [facility] [place] [facility] [place] [facility] [place].  [name] explained that he is also a resident at [facility].   [name] anticipates that [name] will be able [facility] and [facility] there.  CSW called [facility] to gather further information and left a message.  CSW will complete FL2, continue to follow [name] and assist with d/c planning needs.  Employment status:  Retired Database administratornsurance information:  Managed Medicare [name] Recommendations:  Not assessed at this time Information / Referral to community resources:     Patient/Family's Response to care:  [name] was pleasant and appreciative of CSW assistance.  Patient/Family's Understanding of and Emotional Response to Diagnosis, Current Treatment, and  Prognosis:  [name] is pleasantly confused.  [name] anticipates that [name] will return to ALF at discharge.  CSW will continue to follow.  Emotional Assessment Appearance:  Appears stated age Attitude/Demeanor/Rapport:  Other (Cooperative, confused) Affect (typically observed):  Appropriate, Calm, Pleasant Orientation:  Oriented to Self Alcohol / Substance use:  Not Applicable Psych involvement (Current and /or in the community):  No (Comment)  Discharge Needs  Concerns to be addressed:  Discharge Planning Concerns Readmission within the last 30 days:  No Current discharge risk:  Cognitively Impaired Barriers to Discharge:  Continued Medical Work up   [place], Oldsmar[place]etta D, LCSW 09/21/2015, 12:21 PM

## 2015-09-21 NOTE — [place] FL2 (Signed)
[place] MEDICAID FL2 LEVEL OF CARE SCREENING TOOL     IDENTIFICATION  Patient Name: [name] Birthdate: [birth year] Sex: female Admission Date (Current Location): 09/19/2015  [facility] [place] [place]   Facility and Address:  [facility],  [address], [place] [id number]      Provider Number: [id number]  Attending Physician Name and Address:  Elease EtienneAnand D Hongalgi, MD  Relative Name and Phone Number:       Current Level of Care: Hospital Recommended Level of Care: SNF Prior Approval Number:    Date Approved/Denied:   PASRR Number:  Discharge Plan: SNF   Current Diagnoses: Patient Active Problem List   Diagnosis Date Noted  . Pressure ulcer 09/20/2015  . Displaced fracture of right femoral neck (HCC) 09/19/2015  . Altered mental status   . Confusion 06/26/2015  . Fall   . Aspiration pneumonia (HCC) 01/10/2015  . Acute encephalopathy 01/08/2015  . UTI (urinary tract infection) 01/08/2015  . 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 Recurrent falls 11/27/2014  . FTT (failure to thrive) in adult 10/26/2014  . Dehydration 10/26/2014  . Abnormal urinalysis 10/26/2014  . Normocytic anemia 10/26/2014  . Lump of skin of left upper extremity 09/07/2014  . Rash and nonspecific skin eruption 05/08/2014  . Syncope and collapse 11/30/2013  . Low back pain, episodic 11/20/2013  . Chronic kidney disease   . Insomnia 08/31/2013  . Arthritis 10/02/2012  . Valvular heart disease 09/04/2012  . Restless leg syndrome 06/06/2012  . Trochanteric bursitis 08/02/2011  . Dementia 08/02/2011  . Leg pain 03/28/2011  . Hypertension 09/22/2010  . Hypothyroidism 09/22/2010    Orientation RESPIRATION BLADDER Height & Weight     Self  O2 Continent Weight: 177 lb 11.1 oz (80.6 kg) Height:  5\' 5"  (165.1 cm)  BEHAVIORAL SYMPTOMS/MOOD NEUROLOGICAL BOWEL NUTRITION STATUS      Continent    AMBULATORY STATUS COMMUNICATION OF NEEDS Skin    Extensive Assist   PU Stage and Appropriate Care   PU Stage 2 Dressing: Daily                   Personal Care Assistance Level of Assistance  Bathing, Feeding, Dressing Bathing Assistance: Limited assistance Feeding assistance: Limited assistance Dressing Assistance: Limited assistance     Functional Limitations Info             SPECIAL CARE FACTORS FREQUENCY  PT (By licensed PT), OT (By licensed OT)     PT Frequency: Min 3x/week              Contractures      Additional Factors Info  Code Status, Allergies Code Status Info: DNR Allergies Info: Morphine and related, Codeine, Hydrocodone           Current Medications (09/21/2015):  This is the current hospital active medication list Current Facility-Administered Medications  Medication Dose Route Frequency Provider Last Rate Last Dose  . acetaminophen (TYLENOL) tablet 650 mg  650 mg Oral Q6H PRN Samson FredericBrian Swinteck, MD       Or  . acetaminophen (TYLENOL) suppository 650 mg  650 mg Rectal Q6H PRN Samson FredericBrian Swinteck, MD      . acetaminophen (TYLENOL) tablet 1,000 mg  1,000 mg Oral Q6H Samson FredericBrian Swinteck, MD   1,000 mg at 09/21/15 1025  . alum & mag hydroxide-simeth (MAALOX/MYLANTA) 200-200-20 MG/5ML suspension 30 mL  30 mL Oral Q6H PRN Hillary BowJared M Gardner, DO      .  aspirin EC tablet 325 mg  325 mg Oral BID PC Samson Frederic, MD   325 mg at 09/21/15 1024  . clonazePAM (KLONOPIN) tablet 0.5 mg  0.5 mg Oral BID Hillary Bow, DO   0.5 mg at 09/21/15 1025  . docusate sodium (COLACE) capsule 100 mg  100 mg Oral BID Samson Frederic, MD   100 mg at 09/21/15 1024  . escitalopram (LEXAPRO) tablet 5 mg  5 mg Oral Daily Hillary Bow, DO   5 mg at 09/21/15 1024  . guaiFENesin (ROBITUSSIN) 100 MG/5ML solution 200 mg  200 mg Oral BID PRN Hillary Bow, DO      . levothyroxine (SYNTHROID, LEVOTHROID) tablet 50 mcg  50 mcg Oral QAC breakfast Hillary Bow, DO   50 mcg at 09/21/15 1025  . magnesium hydroxide (MILK OF MAGNESIA)  suspension 30 mL  30 mL Oral QHS PRN Hillary Bow, DO      . menthol-cetylpyridinium (CEPACOL) lozenge 3 mg  1 lozenge Oral PRN Samson Frederic, MD       Or  . phenol (CHLORASEPTIC) mouth spray 1 spray  1 spray Mouth/Throat PRN Samson Frederic, MD      . methocarbamol (ROBAXIN) tablet 500 mg  500 mg Oral Q6H PRN Samson Frederic, MD   500 mg at 09/20/15 2334   Or  . methocarbamol (ROBAXIN) 500 mg in dextrose 5 % 50 mL IVPB  500 mg Intravenous Q6H PRN Samson Frederic, MD      . metoCLOPramide (REGLAN) tablet 5-10 mg  5-10 mg Oral Q8H PRN Samson Frederic, MD       Or  . metoCLOPramide (REGLAN) injection 5-10 mg  5-10 mg Intravenous Q8H PRN Samson Frederic, MD      . ondansetron [facility]) tablet 4 mg  4 mg Oral Q6H PRN Samson Frederic, MD       Or  . ondansetron [facility]) injection 4 mg  4 mg Intravenous Q6H PRN Samson Frederic, MD      . risperiDONE (RISPERDAL) tablet 0.5 mg  0.5 mg Oral QHS Hillary Bow, DO   0.5 mg at 09/20/15 2243  . rivastigmine (EXELON) capsule 3 mg  3 mg Oral BID Hillary Bow, DO   3 mg at 09/21/15 1025  . senna (SENOKOT) tablet 8.6 mg  1 tablet Oral BID Samson Frederic, MD   8.6 mg at 09/21/15 1026  . senna-docusate (Senokot-S) tablet 1 tablet  1 tablet Oral BID Hillary Bow, DO   1 tablet at 09/21/15 1024  . traMADol (ULTRAM) tablet 50 mg  50 mg Oral Q6H PRN Samson Frederic, MD      . traZODone (DESYREL) tablet 50 mg  50 mg Oral QHS PRN Hillary Bow, DO   50 mg at 09/20/15 2334     Discharge Medications: Please see discharge summary for a list of discharge medications.  Relevant Imaging Results:  Relevant Lab Results:   Additional Information [id number]  Meyer Cory D, [place]

## 2015-09-22 LAB — CBC
HEMATOCRIT: 31.6 % — AB (ref 36.0–46.0)
Hemoglobin: 10.6 g/dL — ABNORMAL LOW (ref 12.0–15.0)
MCH: 28.9 pg (ref 26.0–34.0)
MCHC: 33.5 g/dL (ref 30.0–36.0)
MCV: 86.1 fL (ref 78.0–100.0)
Platelets: 352 10*3/uL (ref 150–400)
RBC: 3.67 MIL/uL — ABNORMAL LOW (ref 3.87–5.11)
RDW: 12.9 % (ref 11.5–15.5)
WBC: 12.8 10*3/uL — ABNORMAL HIGH (ref 4.0–10.5)

## 2015-09-22 LAB — BASIC METABOLIC PANEL
Anion gap: 11 (ref 5–15)
BUN: 20 mg/dL (ref 6–20)
CO2: 24 mmol/L (ref 22–32)
CREATININE: 0.76 mg/dL (ref 0.44–1.00)
Calcium: 8.8 mg/dL — ABNORMAL LOW (ref 8.9–10.3)
Chloride: 105 mmol/L (ref 101–111)
GFR calc Af Amer: >60 mL/min (ref >60)
GFR calc non Af Amer: >60 mL/min (ref >60)
GLUCOSE: 122 mg/dL — AB (ref 65–99)
Potassium: 3.8 mmol/L (ref 3.5–5.1)
Sodium: 140 mmol/L (ref 135–145)

## 2015-09-22 NOTE — Care Management Important Message (Signed)
Important Message  Patient Details  Name: Deborah CorpusStella P Conroy MRN: [id number] Date of Birth: 14-Nov-1920   Medicare Important Message Given:  Yes    Elliot CousinShavis, Jacory Kamel Ellen, RN 09/22/2015, 4:37 PM

## 2015-09-22 NOTE — Plan of Care (Signed)
Problem: Education: Goal: Verbalization of understanding the information provided (i.e., activity precautions, restrictions, etc) will improve Outcome: Not Met (add Reason) Dementia

## 2015-09-22 NOTE — Progress Notes (Signed)
Physical Therapy Treatment Patient Details Name: [name] MRN: [id number] DOB: Apr 05, 1921 Today's Date: 09/22/2015    History of Present Illness 82 yo F adm with displaced R femoral neck fracture. PMH dementia, HTN, chronic kidney disease, hypothyroid, valvular heart disease/heart murmur. S/p Right hip hemiarthroplasty, anterior approach on 09/20/15.     PT Comments    Pt very pleasant and eager to mobilize but with little insight into current situation.  Follow Up Recommendations  SNF     Equipment Recommendations  None recommended by PT    Recommendations for Other Services       Precautions / Restrictions Precautions Precautions: Fall Restrictions Weight Bearing Restrictions: No Other Position/Activity Restrictions: WBAT    Mobility  Bed Mobility Overal bed mobility: Needs Assistance;+2 for physical assistance Bed Mobility: Supine to Sit     Supine to sit: Mod assist;+2 for physical assistance;+2 for safety/equipment     General bed mobility comments: cues for sequence and use of L LE to self assist.  Pad utilized to bring pt to EOB  Transfers Overall transfer level: Needs assistance Equipment used: Rolling walker (2 wheeled) Transfers: Sit to/from Stand Sit to Stand: Mod assist;+2 physical assistance;+2 safety/equipment;From elevated surface         General transfer comment: cues for LE management and use of UEs to self assist  Ambulation/Gait Ambulation/Gait assistance: Min assist;Mod assist;+2 safety/equipment Ambulation Distance (Feet): 20 Feet Assistive device: Rolling walker (2 wheeled) Gait Pattern/deviations: Step-to pattern;Step-through pattern;Decreased step length - right;Decreased step length - left;Shuffle;Trunk flexed Gait velocity: decr   General Gait Details: cues for sequence, posture and position from [employer]            Wheelchair Mobility    Modified Rankin (Stroke Patients Only)       Balance     Sitting  balance-Leahy Scale: Fair       Standing balance-Leahy Scale: Poor                      Cognition Arousal/Alertness: Awake/alert Behavior During Therapy: WFL for tasks assessed/performed Overall Cognitive Status: History of cognitive impairments - at baseline                      Exercises Total Joint Exercises Ankle Circles/Pumps: AAROM;Both;15 reps;Supine Heel Slides: AAROM;Right;15 reps;Supine Hip ABduction/ADduction: AAROM;Right;15 reps;Supine    General Comments        Pertinent Vitals/Pain Pain Assessment: Faces Faces Pain Scale: Hurts little more Pain Location: R hip/thigh Pain Descriptors / Indicators: Sore Pain Intervention(s): Limited activity within patient's tolerance;Monitored during session;Premedicated before session;Ice applied    Home Living                      Prior Function            PT Goals (current goals can now be found in the care plan section) Acute Rehab PT Goals Patient Stated Goal: to get back to work PT Goal Formulation: Patient unable to participate in goal setting Time For Goal Achievement: 09/28/15 Potential to Achieve Goals: Fair Progress towards PT goals: Progressing toward goals    Frequency  Min 3X/week    PT Plan Current plan remains appropriate    Co-evaluation             End of Session Equipment Utilized During Treatment: Gait belt Activity Tolerance: Patient tolerated treatment well;Patient limited by fatigue Patient left: in chair;with call bell/phone within reach;with  chair alarm set     Time: [phone] PT Time Calculation (min) (ACUTE ONLY): 27 min  Charges:  $Gait Training: 8-22 mins $Therapeutic Exercise: 8-22 mins                    G Codes:      [name] 10/17/2015, 12:38 PM

## 2015-09-22 NOTE — Clinical Social Work Note (Signed)
CSW visited room and talked with patient and son, [name] [phone]) regarding facility responses and answered his questions. Ms. Ralene CorkBame [facility].  Mr. Harrison MonsBlake indicated that if he cannot be reached to call his wife Randa EvensJoanne, [phone].  CSW will continue to follow and assist with discharge to a skilled facility for ST rehab.   Genelle BalVanessa Turkessa Ostrom, MSW, LCSW Licensed Clinical Social Worker Clinical Social Work Department [facility] [phone]

## 2015-09-22 NOTE — Progress Notes (Signed)
PROGRESS NOTE  [name]  ZOX:[id number] DOB: 1920/06/09  DOA: 09/19/2015 PCP: [name], [name], MD  Outpatient Specialists:  Not known  Brief Narrative:  82 year old female patient, NH resident, ambulates with assistance, PMH of dementia, HTN, chronic kidney disease, hypothyroid, valvular heart disease/heart murmur, [facility] due to 3 days history of right lower extremity pain in the absence of any reported recent falls. Fever of 100.68F with EMS. Admitted for displaced right femoral neck fracture. Orthopedics consulted and s/p right hip hemiarthroplasty on 4/20. DC to SNF when medically stable.   Assessment & Plan:   Principal Problem:   Displaced fracture of right femoral neck (HCC) Active Problems:   Hypertension   Dementia   Pressure ulcer   Displaced fracture of right femoral neck - No report of fall or trauma. - Orthopedics were consulted and she underwent right hip hemiarthroplasty on 4/20. - As per orthopedic follow-up, WBAT with walker, ASA 325 MG PO BID 30 days for DVT prophylaxis. - SNF in 1 or 2 days.  Essential hypertension - Reasonable inpatient control. [facility]  Hypothyroid - Continue Synthroid.  Dementia - No agitation at this time. Continue risperidone, clonazepam and Lexapro.  Valvular heart disease - 2-D echo 10/03/13: LVEF 60-65 percent, moderate AR and MR.  Acute blood loss anemia complicating chronic anemia - Baseline hemoglobin probably in the 11 g range. Dropped to 10 g postop. Stable in the 10 g range.   DVT prophylaxis: SCD's. Postop as per orthopedic service. Code Status: DO NOT RESUSCITATE Family Communication: None at bedside. Disposition Plan: SNF when medically stable, possibly in the next 1-2 days.   Consultants:   Orthopedics  Procedures:   Foley catheter -discontinued.  Right hip hemiarthroplasty 4/20  Antimicrobials:   None    Subjective: Denies pain. As per  RN, no acute issues.  Objective:  Filed Vitals:   09/21/15 2043 09/22/15 0537 09/22/15 1000 09/22/15 1300  BP: 140/51 123/49 134/53 139/47  Pulse: 70 68 70 63  Temp: 98 F (36.7 C) 97.9 F (36.6 C) 97.6 F (36.4 C) 98.2 F (36.8 C)  TempSrc: Oral Oral    Resp: 14 16 16 15   Height:      Weight:      SpO2: 95% 95% 96% 94%    Intake/Output Summary (Last 24 hours) at 09/22/15 1327 Last data filed at 09/22/15 1300  Gross per 24 hour  Intake 3752.5 ml  Output    600 ml  Net 3152.5 ml   Filed Weights   09/20/15 0050  Weight: 80.6 kg (177 lb 11.1 oz)    Examination:  General exam: Pleasant elderly female sitting up comfortably in chair this morning.   Respiratory system: Clear to auscultation. Respiratory effort normal. Cardiovascular system: S1 & S2 heard, RRR. No JVD, murmurs, rubs, gallops or clicks. No pedal edema. Gastrointestinal system: Abdomen is nondistended, soft and nontender. No organomegaly or masses felt. Normal bowel sounds heard.  Central nervous system: Alert and oriented to self only. No focal neurological deficits. Extremities: Moving all limbs well except right lower extremity-limited secondary to pain. Right hip postop site dressing clean and dry. Skin: No rashes, lesions or ulcers    Data Reviewed: I have personally reviewed following labs and imaging studies  CBC:  Recent Labs Lab 09/19/15 2212 09/21/15 0404 09/22/15 0417  WBC 16.6* 15.1* 12.8*  NEUTROABS 13.6*  --   --   HGB 11.9* 10.0* 10.6*  HCT  35.2* 29.4* 31.6*  MCV 84.8 86.0 86.1  PLT 339 310 352   Basic Metabolic Panel:  Recent Labs Lab 09/19/15 2212 09/21/15 0404 09/22/15 0417  NA 140 140 140  K 4.4 3.9 3.8  CL 102 104 105  CO2 GLUCOSE 118* 128* 122*  BUN 22* 21* 20  CREATININE 0.75 0.91 0.76  CALCIUM 9.3 8.6* 8.8*   GFR: Estimated Creatinine Clearance: 46.1 mL/min (by C-G formula based on Cr of 0.76). Liver Function Tests: No results for input(s): AST,  ALT, ALKPHOS, BILITOT, PROT, ALBUMIN in the last 168 hours. No results for input(s): LIPASE, AMYLASE in the last 168 hours. No results for input(s): AMMONIA in the last 168 hours. Coagulation Profile: No results for input(s): INR, PROTIME in the last 168 hours. Cardiac Enzymes: No results for input(s): CKTOTAL, CKMB, CKMBINDEX, TROPONINI in the last 168 hours. BNP (last 3 results) No results for input(s): PROBNP in the last 8760 hours. HbA1C: No results for input(s): HGBA1C in the last 72 hours. CBG: No results for input(s): GLUCAP in the last 168 hours. Lipid Profile: No results for input(s): CHOL, HDL, LDLCALC, TRIG, CHOLHDL, LDLDIRECT in the last 72 hours. Thyroid Function Tests: No results for input(s): TSH, T4TOTAL, FREET4, T3FREE, THYROIDAB in the last 72 hours. Anemia Panel: No results for input(s): VITAMINB12, FOLATE, FERRITIN, TIBC, IRON, RETICCTPCT in the last 72 hours. Urine analysis:    Component Value Date/Time   COLORURINE AMBER* 09/20/2015 0026   APPEARANCEUR CLEAR 09/20/2015 0026   LABSPEC 1.027 09/20/2015 0026   PHURINE 5.5 09/20/2015 0026   GLUCOSEU NEGATIVE 09/20/2015 0026   GLUCOSEU NEGATIVE 04/06/2014 1127   HGBUR NEGATIVE 09/20/2015 0026   BILIRUBINUR NEGATIVE 09/20/2015 0026   BILIRUBINUR small 03/14/2014 1151   KETONESUR NEGATIVE 09/20/2015 0026   PROTEINUR NEGATIVE 09/20/2015 0026   PROTEINUR trace 03/14/2014 1151   UROBILINOGEN 0.2 03/23/2015 0053   UROBILINOGEN 0.2 03/14/2014 1151   NITRITE NEGATIVE 09/20/2015 0026   NITRITE positive 03/14/2014 1151   LEUKOCYTESUR TRACE* 09/20/2015 0026    Recent Results (from the past 240 hour(s))  Surgical pcr screen     Status: None   Collection Time: 09/20/15  5:26 AM  Result Value Ref Range Status   MRSA, PCR NEGATIVE NEGATIVE Final   Staphylococcus aureus NEGATIVE NEGATIVE Final    Comment:        The Xpert SA Assay (FDA approved for NASAL specimens in patients over 40 years of age), is one component  of a comprehensive surveillance program.  Test performance has been validated by [facility] for patients greater than or equal to 36 year old. It is not intended to diagnose infection nor to guide or monitor treatment.          Radiology Studies: Pelvis Portable  09/20/2015  CLINICAL DATA:  Postoperative right hip replacement EXAM: DG C-ARM 1-60 MIN-NO REPORT; PORTABLE PELVIS 1-2 VIEWS COMPARISON:  Intraoperative fluoroscopic images 4 2017. Pelvis 06/08/2015 FINDINGS: Right hip hemiarthroplasty with non cemented component. Components appear well seated. No evidence of acute fracture or dislocation. Soft tissue emphysema consistent with recent surgery. Degenerative changes in the left hip. IMPRESSION: Right hip hemiarthroplasty.  Components appear well seated. Electronically Signed   By: Burman Nieves M.D.   On: 09/20/2015 21:21   Dg C-arm 1-60 Min-no Report  09/20/2015  CLINICAL DATA: surgery C-ARM 1-60 MINUTES Fluoroscopy was utilized by the requesting physician.  No radiographic interpretation.   Dg Hip Operative Unilat With Pelvis Right  09/20/2015  CLINICAL DATA:  RIGHT total hip arthroplasty EXAM: OPERATIVE RIGHT HIP (WITH PELVIS IF PERFORMED) 2 VIEWS TECHNIQUE: Fluoroscopic spot image(s) were submitted for interpretation post-operatively. COMPARISON:  02/16/2015 FLUOROSCOPY TIME:  0 minutes 8 seconds Images obtained:  2 Exposure:  0.87 mGy FINDINGS: RIGHT hip prosthesis identified in expected position. Bones appear demineralized. No fracture dislocation seen. IMPRESSION: RIGHT hip prosthesis without acute fracture or dislocation. Electronically Signed   By: Ulyses Southward M.D.   On: 09/20/2015 20:31        Scheduled Meds: . aspirin EC  325 mg Oral BID PC  . clonazePAM  0.5 mg Oral BID  . docusate sodium  100 mg Oral BID  . escitalopram  5 mg Oral Daily  . levothyroxine  50 mcg Oral QAC breakfast  . risperiDONE  0.5 mg Oral QHS  . rivastigmine  3 mg Oral BID  . senna  1  tablet Oral BID  . senna-docusate  1 tablet Oral BID   Continuous Infusions:     LOS: 3 days    Time spent: 25 minutes.    [facility], MD [facility] Pager [phone]  If 7PM-7AM, please contact night-coverage [email or web address] Password [email or web address] 09/22/2015, 1:27 PM

## 2015-09-22 NOTE — Care Management Note (Addendum)
Case Management Note  Patient Details  Name: Jeannette CorpusStella P Sabas MRN: [id number] Date of Birth: 13-Dec-1920  Subjective/Objective:       right hip hemiarthroplasty on 4/20             Action/Plan: Discharge Planning: NCM spoke to son, Marlane MingleCharles Byrd # [phone] or # [phone]. Plan is for SNF. Son states he does not want [facility]. Contacted CSW to follow up with son on SNF choices. [facility]  PCP Bradd CanaryBLYTH, [name] A- MD Expected Discharge Date:  09/24/15               Expected Discharge Plan:  Skilled Nursing Facility  In-House Referral:  Clinical Social Work  Discharge planning Services  CM Consult  Post Acute Care Choice:  NA Choice offered to:  NA  DME Arranged:  N/A DME Agency:  NA  HH Arranged:  NA HH Agency:  NA  Status of Service:  Completed, signed off  Medicare Important Message Given:  yes Date Medicare IM Given:    Medicare IM give by:    Date Additional Medicare IM Given:    Additional Medicare Important Message give by:     If discussed at Long Length of Stay Meetings, dates discussed:    Additional Comments:  Elliot CousinShavis, Andilynn Delavega Ellen, RN 09/22/2015, 3:43 PM

## 2015-09-22 NOTE — Progress Notes (Signed)
    Subjective: 2 Days Post-Op Procedure(s) (LRB): ARTHROPLASTY RIGHT  BIPOLAR ANTERIOR HIP (HEMIARTHROPLASTY) (Right) Patient reports pain as 2 on 0-10 scale.   Denies CP or SOB.  Voiding without difficulty. Positive flatus. Objective: Vital signs in last 24 hours: Temp:  [97.6 F (36.4 C)-98.1 F (36.7 C)] 97.9 F (36.6 C) (04/22 0537) Pulse Rate:  [65-70] 68 (04/22 0537) Resp:  [14-16] 16 (04/22 0537) BP: (123-140)/(45-51) 123/49 mmHg (04/22 0537) SpO2:  [94 %-99 %] 95 % (04/22 0537)  Intake/Output from previous day: 04/21 0701 - 04/22 0700 In: 3512.5 [P.O.:595; I.V.:2917.5] Out: 550 [Urine:550] Intake/Output this shift:    Labs:  Recent Labs  09/19/15 2212 09/21/15 0404 09/22/15 0417  HGB 11.9* 10.0* 10.6*    Recent Labs  09/21/15 0404 09/22/15 0417  WBC 15.1* 12.8*  RBC 3.42* 3.67*  HCT 29.4* 31.6*  PLT 310 352    Recent Labs  09/21/15 0404 09/22/15 0417  NA 140 140  K 3.9 3.8  CL 104 105  CO2 29 24  BUN 21* 20  CREATININE 0.91 0.76  GLUCOSE 128* 122*  CALCIUM 8.6* 8.8*   No results for input(s): LABPT, INR in the last 72 hours.  Physical Exam: ABD soft Sensation intact distally Dorsiflexion/Plantar flexion intact Incision: dressing C/D/I Compartment soft  Confused - pt has dementia  Assessment/Plan: 2 Days Post-Op Procedure(s) (LRB): ARTHROPLASTY RIGHT  BIPOLAR ANTERIOR HIP (HEMIARTHROPLASTY) (Right) WBAT with walker ASA - DVT prophylaxis Waiting for SNF placement PT/OT PO pain control  Eastyn Skalla, Baxter Kailarmen Christina for [facility]. Venita [facility]ahari [facility] [phone] 09/22/2015, 7:03 AM

## 2015-09-23 DIAGNOSIS — F419 Anxiety disorder, unspecified: Secondary | ICD-10-CM | POA: Diagnosis not present

## 2015-09-23 DIAGNOSIS — F29 Unspecified psychosis not due to a substance or known physiological condition: Secondary | ICD-10-CM | POA: Diagnosis not present

## 2015-09-23 DIAGNOSIS — Z96641 Presence of right artificial hip joint: Secondary | ICD-10-CM | POA: Diagnosis not present

## 2015-09-23 DIAGNOSIS — D649 Anemia, unspecified: Secondary | ICD-10-CM | POA: Diagnosis not present

## 2015-09-23 DIAGNOSIS — I10 Essential (primary) hypertension: Secondary | ICD-10-CM | POA: Diagnosis not present

## 2015-09-23 DIAGNOSIS — R5381 Other malaise: Secondary | ICD-10-CM | POA: Diagnosis not present

## 2015-09-23 DIAGNOSIS — R32 Unspecified urinary incontinence: Secondary | ICD-10-CM | POA: Diagnosis not present

## 2015-09-23 DIAGNOSIS — F0391 Unspecified dementia with behavioral disturbance: Secondary | ICD-10-CM | POA: Diagnosis not present

## 2015-09-23 DIAGNOSIS — G4700 Insomnia, unspecified: Secondary | ICD-10-CM | POA: Diagnosis not present

## 2015-09-23 DIAGNOSIS — R2689 Other abnormalities of gait and mobility: Secondary | ICD-10-CM | POA: Diagnosis not present

## 2015-09-23 DIAGNOSIS — R451 Restlessness and agitation: Secondary | ICD-10-CM | POA: Diagnosis not present

## 2015-09-23 DIAGNOSIS — Z4789 Encounter for other orthopedic aftercare: Secondary | ICD-10-CM | POA: Diagnosis not present

## 2015-09-23 DIAGNOSIS — F339 Major depressive disorder, recurrent, unspecified: Secondary | ICD-10-CM | POA: Diagnosis not present

## 2015-09-23 DIAGNOSIS — D62 Acute posthemorrhagic anemia: Secondary | ICD-10-CM | POA: Diagnosis not present

## 2015-09-23 DIAGNOSIS — R400 Somnolence: Secondary | ICD-10-CM | POA: Diagnosis not present

## 2015-09-23 DIAGNOSIS — Z9660 Presence of unspecified orthopedic joint implant: Secondary | ICD-10-CM | POA: Diagnosis not present

## 2015-09-23 DIAGNOSIS — L98419 Non-pressure chronic ulcer of buttock with unspecified severity: Secondary | ICD-10-CM | POA: Diagnosis not present

## 2015-09-23 DIAGNOSIS — S72001D Fracture of unspecified part of neck of right femur, subsequent encounter for closed fracture with routine healing: Secondary | ICD-10-CM | POA: Diagnosis not present

## 2015-09-23 DIAGNOSIS — M79604 Pain in right leg: Secondary | ICD-10-CM | POA: Diagnosis not present

## 2015-09-23 DIAGNOSIS — L89153 Pressure ulcer of sacral region, stage 3: Secondary | ICD-10-CM | POA: Diagnosis not present

## 2015-09-23 DIAGNOSIS — S72009A Fracture of unspecified part of neck of unspecified femur, initial encounter for closed fracture: Secondary | ICD-10-CM | POA: Diagnosis not present

## 2015-09-23 DIAGNOSIS — E039 Hypothyroidism, unspecified: Secondary | ICD-10-CM | POA: Diagnosis not present

## 2015-09-23 DIAGNOSIS — M6281 Muscle weakness (generalized): Secondary | ICD-10-CM | POA: Diagnosis not present

## 2015-09-23 DIAGNOSIS — F0390 Unspecified dementia without behavioral disturbance: Secondary | ICD-10-CM | POA: Diagnosis not present

## 2015-09-23 DIAGNOSIS — F329 Major depressive disorder, single episode, unspecified: Secondary | ICD-10-CM | POA: Diagnosis not present

## 2015-09-23 DIAGNOSIS — W19XXXA Unspecified fall, initial encounter: Secondary | ICD-10-CM | POA: Diagnosis not present

## 2015-09-23 DIAGNOSIS — L22 Diaper dermatitis: Secondary | ICD-10-CM | POA: Diagnosis not present

## 2015-09-23 LAB — CBC
HCT: 28.9 % — ABNORMAL LOW (ref 36.0–46.0)
HEMOGLOBIN: 9.7 g/dL — AB (ref 12.0–15.0)
MCH: 28.8 pg (ref 26.0–34.0)
MCHC: 33.6 g/dL (ref 30.0–36.0)
MCV: 85.8 fL (ref 78.0–100.0)
Platelets: 359 10*3/uL (ref 150–400)
RBC: 3.37 MIL/uL — AB (ref 3.87–5.11)
RDW: 12.9 % (ref 11.5–15.5)
WBC: 13.3 10*3/uL — ABNORMAL HIGH (ref 4.0–10.5)

## 2015-09-23 IMAGING — CR DG ANKLE COMPLETE 3+V*L*
2 series · 2 of 2 positions shown · non-contrast
Comparison: None.

CLINICAL DATA: Three weeks of left lateral ankle pain and swelling
without known injury.

EXAM:
LEFT ANKLE COMPLETE - 3+ VIEW

[view not recorded (1 of 2)]
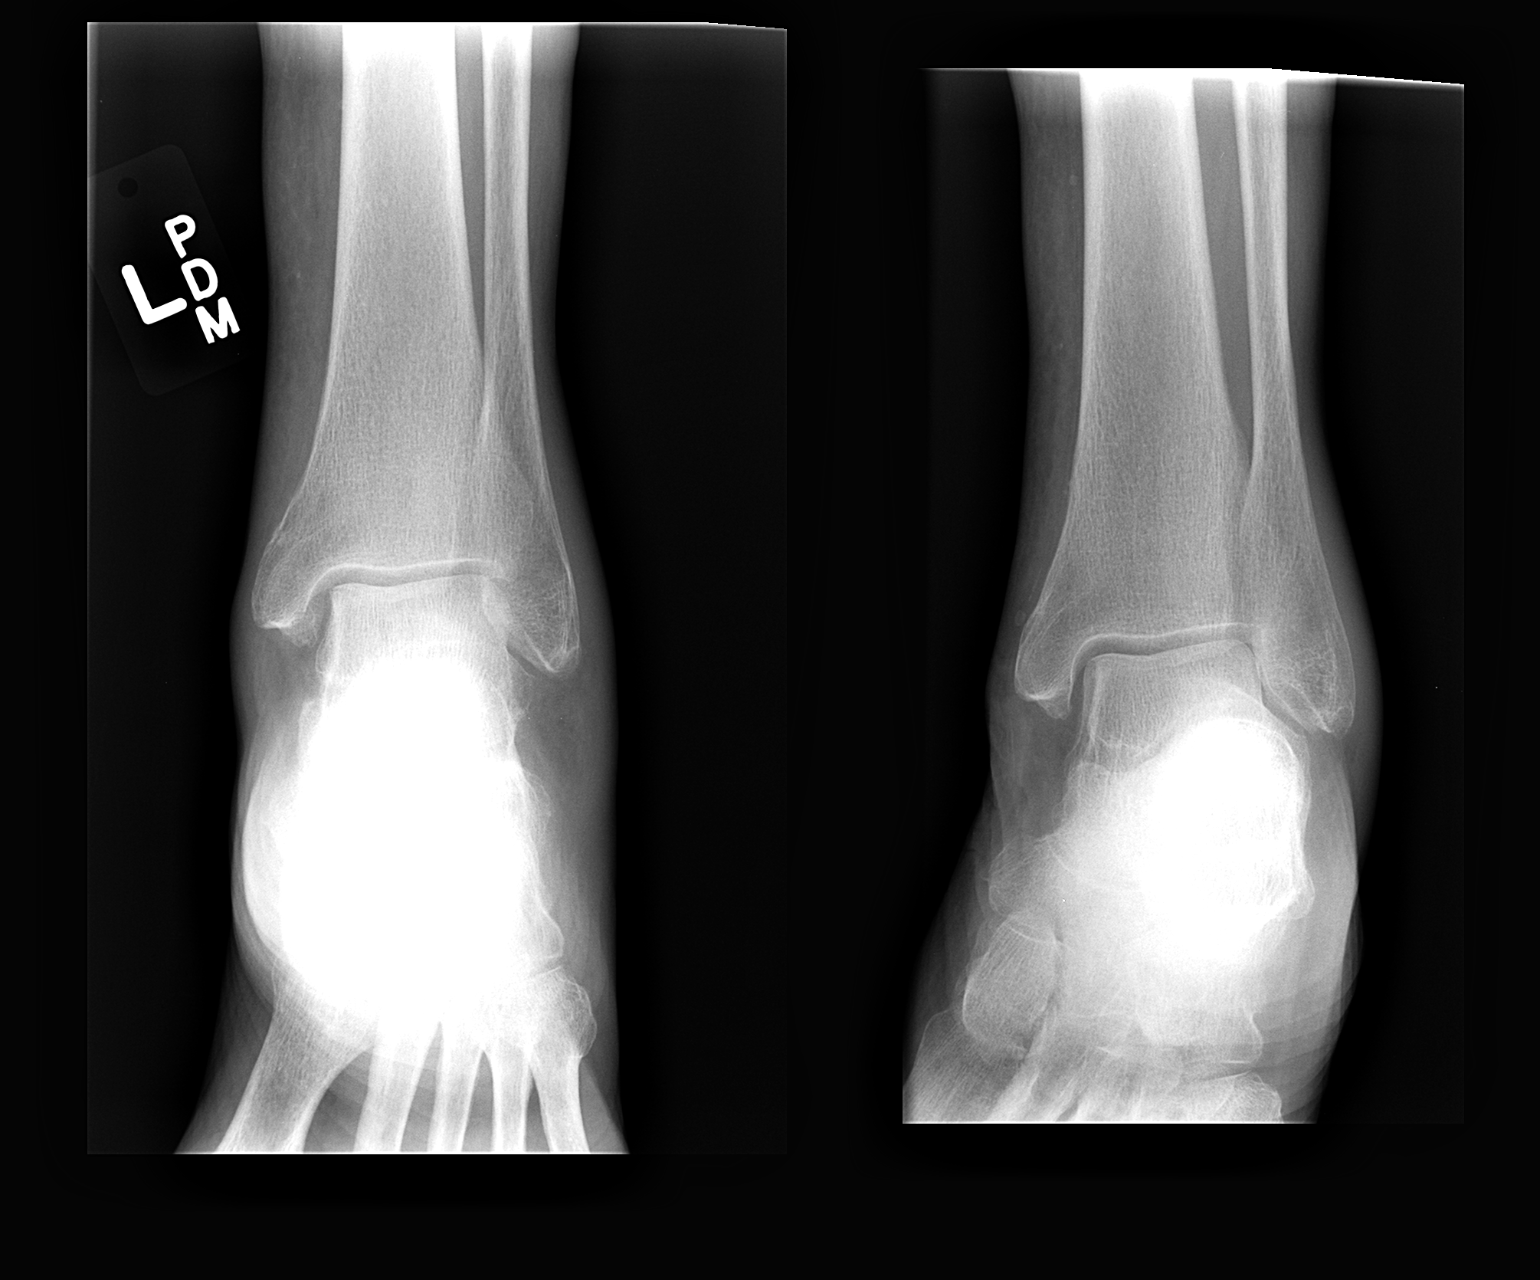

[view not recorded (2 of 2)]
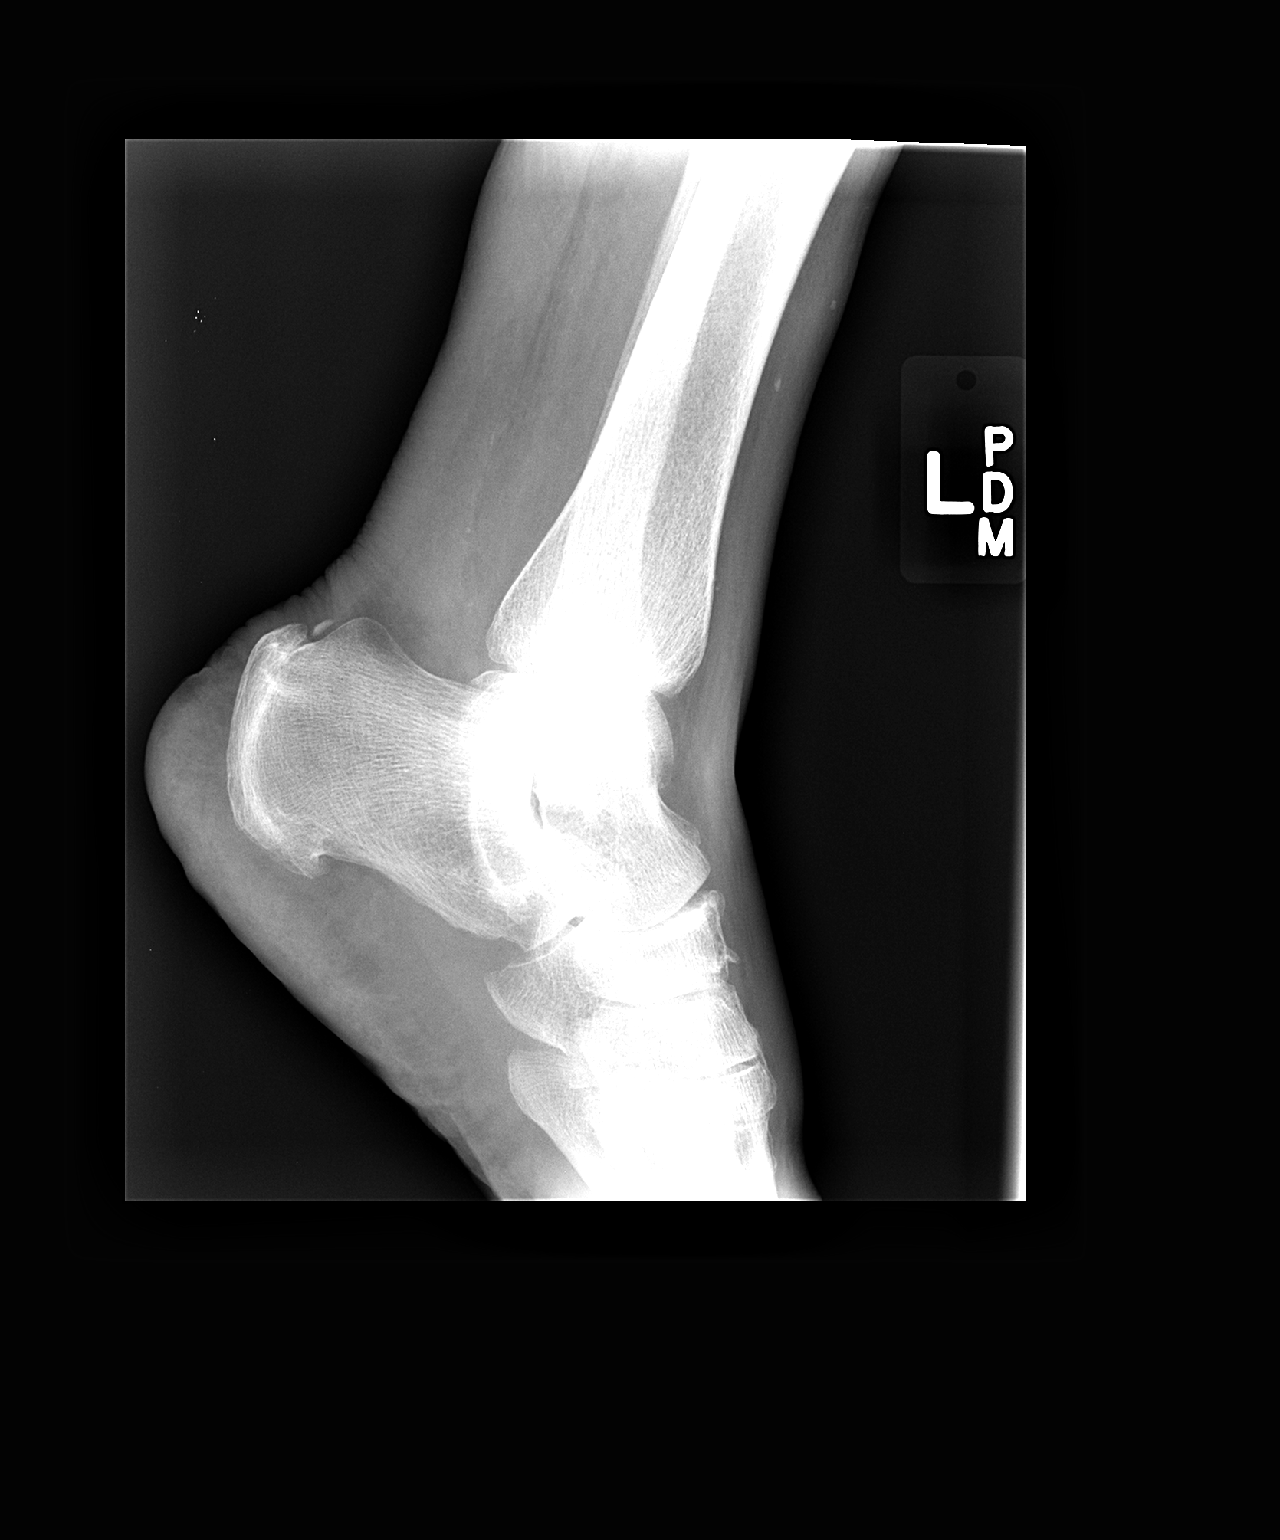

[2 of 2 positions shown; findings below may reference images not displayed]

FINDINGS: The bones are adequately mineralized. The ankle joint mortise is
preserved. There is minimal swelling over the lateral malleolus.
There is no underlying fracture. The talus and calcaneus are intact.
There are plantar and Achilles region spurs.
IMPRESSION: There is mild soft tissue swelling laterally. No underlying bony
abnormality is demonstrated.

## 2015-09-23 MED ORDER — ASPIRIN 325 MG PO TBEC: 325.0000 mg | DELAYED_RELEASE_TABLET | Freq: Two times a day (BID) | ORAL | Status: DC

## 2015-09-23 MED ORDER — TRAMADOL HCL 50 MG PO TABS: 50.0000 mg | ORAL_TABLET | Freq: Four times a day (QID) | ORAL | Status: DC | PRN

## 2015-09-23 MED ORDER — CLONAZEPAM 0.5 MG PO TABS: 0.5000 mg | ORAL_TABLET | Freq: Two times a day (BID) | ORAL | Status: DC

## 2015-09-23 NOTE — Progress Notes (Signed)
[facility] on unit to transfer pt to SNF

## 2015-09-23 NOTE — Clinical Social Work Placement (Signed)
CLINICAL SOCIAL WORK PLACEMENT  NOTE  Date:  09/23/2015  Patient Details  Name: [name] MRN: [id number] Date of Birth: 1921/01/17  Clinical Social Work is seeking post-discharge placement for this patient at the Skilled  Nursing Facility level of care (*CSW will initial, date and re-position this form in  chart as items are completed):  Yes   Patient/family provided with [place] Clinical Social Work Department's list of facilities offering this level of care within the geographic area requested by the patient (or if unable, by the patient's family).  Yes   Patient/family informed of their freedom to choose among providers that offer the needed level of care, that participate in Medicare, Medicaid or managed care program needed by the patient, have an available bed and are willing to accept the patient.  Yes   Patient/family informed of [facility]'s ownership interest in [facility] and [facility], as well as of the fact that they are under no obligation to receive care at these facilities.  PASRR submitted to EDS on 09/21/15     PASRR number received on 09/21/15     Existing PASRR number confirmed on       FL2 transmitted to all facilities in geographic area requested by pt/family on       FL2 transmitted to all facilities within larger geographic area on       Patient informed that his/her managed care company has contracts with or will negotiate with certain facilities, including the following:        Yes   Patient/family informed of bed offers received.  Patient chooses bed at [facility]     Physician recommends and patient chooses bed at      Patient to be transferred to [facility] on 09/23/15.  Patient to be transferred to facility by [facility]     Patient family notified on 09/23/15 of transfer.  Name of family member notified:  [name]     PHYSICIAN       Additional Comment:     _______________________________________________ Rod Mae, LCSW 09/23/2015, 1:57 PM

## 2015-09-23 NOTE — Progress Notes (Signed)
[facility].

## 2015-09-23 NOTE — Clinical Social Work Note (Addendum)
Patient to be discharged to [facility]. Patient's son, Valera CastleCharles Blake, [facility] [employer] Medicare authorization: [id number] Patient to be transported via [facility].  RN report number: [id number] PASARR: [id number] D  Marcelline Deistmily Lanyia Jewel, LCSW [phone] Orthopedics: [phone] Surgical: [phone]

## 2015-09-23 NOTE — Discharge Summary (Addendum)
Physician Discharge Summary  [name]  NFA:[id number]  DOB: 07-31-1920  DOA: 09/19/2015  PCP: [name], [name], MD  Admit date: 09/19/2015 Discharge date: 09/23/2015  Time spent: Greater than 30 minutes  Recommendations for Outpatient Follow-up:  1. M.D. at SNF in 2 days with repeat labs (CBC & BMP). 2. Dr. Danise EdgeStacey Blyth, PCP upon discharge from SNF. 3. Dr. Loreli DollarBrian Swintek, Orthopedics in 2 weeks.  Discharge Diagnoses:  Principal Problem:   Displaced fracture of right femoral neck (HCC) Active Problems:   Hypertension   Dementia   Pressure ulcer   Discharge Condition: Improved & Stable  Diet recommendation: Heart healthy diet.  Filed Weights   09/20/15 0050  Weight: 80.6 kg (177 lb 11.1 oz)    History of present illness:  82 year old female patient, NH resident, ambulates with assistance, PMH of dementia, HTN, chronic kidney disease, hypothyroid, valvular heart disease/heart murmur, [facility] due to 3 days history of right lower extremity pain in the absence of any reported recent falls. Fever of 100.70F with EMS. Admitted for displaced right femoral neck fracture. Orthopedics consulted and s/p right hip hemiarthroplasty on 4/20  Hospital Course:   Displaced fracture of right femoral neck - No report of fall or trauma. - Orthopedics were consulted and she underwent right hip hemiarthroplasty on 4/20. - As per orthopedic follow-up, WBAT with walker, ASA 325 MG PO BID 30 days for DVT prophylaxis and outpatient follow-up with orthopedics in 2 weeks. Orthopedics is seen today and cleared for discharge to SNF. As per RN report, patient keeps pulling out OpSite dressing.  Essential hypertension - Reasonable inpatient control. [facility]  Hypothyroid - Continue Synthroid.  Dementia - No agitation at this time. Continue risperidone, clonazepam and Lexapro. As per RN, patient was slightly agitated overnight but none this  morning.  Valvular heart disease - 2-D echo 10/03/13: LVEF 60-65 percent, moderate AR and MR.  Acute blood loss anemia complicating chronic anemia - Baseline hemoglobin probably in the 11 g range. Dropped to 10 g postop. Stable. [facility]  DO NOT RESUSCITATE   Consultants:   Orthopedics  Procedures:   Foley catheter -discontinued.  Right hip hemiarthroplasty 4/20   Discharge Exam:  Complaints:  Pleasantly confused. Denies complaints. Denies pain. As per RN, patient keeps pulling out OpSite dressing which was changed this morning.  Filed Vitals:   09/22/15 1000 09/22/15 1300 09/22/15 1953 09/23/15 0641  BP: 134/53 139/47 151/58 128/39  Pulse: 70 63 71 79  Temp: 97.6 F (36.4 C) 98.2 F (36.8 C) 98.8 F (37.1 C) 98.8 F (37.1 C)  TempSrc:   Oral Oral  Resp: 16 15 18 18   Height:      Weight:      SpO2: 96% 94% 96% 96%    General exam: Elderly female, moderately built and frail, lying comfortably propped up in bed without distress. Respiratory system: Clear/poor inspiratory effort. No increased work of breathing. Cardiovascular system: S1 & S2 heard, RRR. No JVD, murmurs, gallops, clicks or pedal edema. Gastrointestinal system: Abdomen is nondistended, soft and nontender. Normal bowel sounds heard. Central nervous system: Alert and oriented only to self. No focal neurological deficits. Extremities: Symmetric 5 x 5 power. Right hip incision site clean and dry without acute findings.  Discharge Instructions      Discharge Instructions    Call MD for:  difficulty breathing, headache or visual disturbances  Complete by:  As directed      Call MD for:  extreme fatigue    Complete by:  As directed      Call MD for:  persistant dizziness or light-headedness    Complete by:  As directed      Call MD for:  persistant nausea and vomiting    Complete by:  As directed      Call MD for:  redness, tenderness, or signs of infection (pain,  swelling, redness, odor or green/yellow discharge around incision site)    Complete by:  As directed      Call MD for:  severe uncontrolled pain    Complete by:  As directed      Call MD for:  temperature >100.4    Complete by:  As directed      Diet - low sodium heart healthy    Complete by:  As directed      Increase activity slowly    Complete by:  As directed   Weight bearing as tolerated with walker, on right lower extremity            Medication List    STOP taking these medications        aspirin 81 MG chewable tablet  Replaced by:  aspirin 325 MG EC tablet      TAKE these medications        acetaminophen 500 MG tablet  Commonly known as:  TYLENOL  Take 500 mg by mouth every 4 (four) hours as needed for mild pain, moderate pain or fever.     aspirin 325 MG EC tablet  Take 1 tablet (325 mg total) by mouth 2 (two) times daily after a meal. To be taken for 30 days postop (surgery date: 09/20/15). After the 30 days period, change to prior dose of 81 mg daily.     clonazePAM 0.5 MG tablet  Commonly known as:  KLONOPIN  Take 1 tablet (0.5 mg total) by mouth 2 (two) times daily.     escitalopram 5 MG tablet  Commonly known as:  LEXAPRO  Take 5 mg by mouth daily.     guaifenesin 100 MG/5ML syrup  Commonly known as:  ROBITUSSIN  Take 200 mg by mouth 2 (two) times daily as needed for cough.     levothyroxine 50 MCG tablet  Commonly known as:  SYNTHROID, LEVOTHROID  TAKE 1 TABLET (50 MCG TOTAL) BY MOUTH DAILY.     loperamide 2 MG capsule  Commonly known as:  IMODIUM  Take 2 mg by mouth 4 (four) times daily as needed for diarrhea or loose stools.     magnesium hydroxide 400 MG/5ML suspension  Commonly known as:  MILK OF MAGNESIA  Take 30 mLs by mouth at bedtime as needed for mild constipation.     MINTOX 200-200-20 MG/5ML suspension  Generic drug:  alum & mag hydroxide-simeth  Take 30 mLs by mouth every 6 (six) hours as needed for indigestion or heartburn.      neomycin-bacitracin-polymyxin ointment  Commonly known as:  NEOSPORIN  Apply 1 application topically daily as needed for wound care.     PRESCRIPTION MEDICATION  Take 1 each by mouth 3 (three) times daily. Mighty Shakes     risperiDONE 0.5 MG tablet  Commonly known as:  RISPERDAL  Take 0.5 mg by mouth at bedtime.     rivastigmine 3 MG capsule  Commonly known as:  EXELON  Take 3 mg by mouth 2 (two) times  daily.     SENEXON-S 8.6-50 MG tablet  Generic drug:  senna-docusate  Take 1 tablet by mouth 2 (two) times daily.     traMADol 50 MG tablet  Commonly known as:  ULTRAM  Take 1 tablet (50 mg total) by mouth every 6 (six) hours as needed for severe pain.     traZODone 50 MG tablet  Commonly known as:  DESYREL  Take 50 mg by mouth at bedtime as needed for sleep.       Follow-up Information    Follow up with Swinteck, Cloyde Reams, MD. Schedule an appointment as soon as possible for a visit in 2 weeks.   Specialty:  Orthopedic Surgery   Why:  For wound re-check   Contact information:   [address] [place] [address] [phone]       Get Medicines reviewed and adjusted: Please take all your medications with you for your next visit with your Primary MD  Please request your Primary MD to go over all hospital tests and procedure/radiological results at the follow up. Please ask your Primary MD to get all Hospital records sent to his/her office.  If you experience worsening of your admission symptoms, develop shortness of breath, life threatening emergency, suicidal or homicidal thoughts you must seek medical attention immediately by calling 911 or calling your MD immediately if symptoms less severe.  You must read complete instructions/literature along with all the possible adverse reactions/side effects for all the Medicines you take and that have been prescribed to you. Take any new Medicines after you have completely understood and accept all the  possible adverse reactions/side effects.   Do not drive when taking pain medications.   Do not take more than prescribed Pain, Sleep and Anxiety Medications  Special Instructions: If you have smoked or chewed Tobacco in the last 2 yrs please stop smoking, stop any regular Alcohol and or any Recreational drug use.  Wear Seat belts while driving.  Please note  You were cared for by a hospitalist during your hospital stay. Once you are discharged, your primary care physician will handle any further medical issues. Please note that NO REFILLS for any discharge medications will be authorized once you are discharged, as it is imperative that you return to your primary care physician (or establish a relationship with a primary care physician if you do not have one) for your aftercare needs so that they can reassess your need for medications and monitor your lab values.    The results of significant diagnostics from this hospitalization (including imaging, microbiology, ancillary and laboratory) are listed below for reference.    Significant Diagnostic Studies: Pelvis Portable  09/20/2015  CLINICAL DATA:  Postoperative right hip replacement EXAM: DG C-ARM 1-60 MIN-NO REPORT; PORTABLE PELVIS 1-2 VIEWS COMPARISON:  Intraoperative fluoroscopic images 4 2017. Pelvis 06/08/2015 FINDINGS: Right hip hemiarthroplasty with non cemented component. Components appear well seated. No evidence of acute fracture or dislocation. Soft tissue emphysema consistent with recent surgery. Degenerative changes in the left hip. IMPRESSION: Right hip hemiarthroplasty.  Components appear well seated. Electronically Signed   By: Burman Nieves M.D.   On: 09/20/2015 21:21   Chest Portable 1 View  09/20/2015  CLINICAL DATA:  Preop parenchymal EXAM: PORTABLE CHEST 1 VIEW COMPARISON:  09/19/2015 FINDINGS: Normal cardiac silhouette. Lungs are clear. Scoliosis noted. No acute osseous abnormality. IMPRESSION: 1. No acute  cardiopulmonary findings. 2. Scoliosis. Electronically Signed   By: Genevive Bi M.D.   On: 09/20/2015  07:11   Dg Knee Right Port  09/20/2015  CLINICAL DATA:  Fall 3 days ago.  RIGHT diffuse knee pain EXAM: PORTABLE RIGHT KNEE - 1-2 VIEW COMPARISON:  02/06/2015 FINDINGS: No fracture of the proximal tibia or distal femur. Patella is normal. No joint effusion. There is and a calcification within the medial lateral compartments consists with chondrocalcinosis. IMPRESSION: 1. No acute findings of the RIGHT knee. 2. Chondrocalcinosis, unchanged. Electronically Signed   By: Genevive Bi M.D.   On: 09/20/2015 08:44   Dg C-arm 1-60 Min-no Report  09/20/2015  CLINICAL DATA: surgery C-ARM 1-60 MINUTES Fluoroscopy was utilized by the requesting physician.  No radiographic interpretation.   Dg Hip Operative Unilat With Pelvis Right  09/20/2015  CLINICAL DATA:  RIGHT total hip arthroplasty EXAM: OPERATIVE RIGHT HIP (WITH PELVIS IF PERFORMED) 2 VIEWS TECHNIQUE: Fluoroscopic spot image(s) were submitted for interpretation post-operatively. COMPARISON:  02/16/2015 FLUOROSCOPY TIME:  0 minutes 8 seconds Images obtained:  2 Exposure:  0.87 mGy FINDINGS: RIGHT hip prosthesis identified in expected position. Bones appear demineralized. No fracture dislocation seen. IMPRESSION: RIGHT hip prosthesis without acute fracture or dislocation. Electronically Signed   By: Ulyses Southward M.D.   On: 09/20/2015 20:31   Dg Hip Unilat With Pelvis 2-3 Views Right  09/19/2015  CLINICAL DATA:  Dementia. Right hip pain, 3 days duration. No known injury. EXAM: DG HIP (WITH OR WITHOUT PELVIS) 2-3V RIGHT COMPARISON:  02/06/2015 FINDINGS: Complete femoral neck fracture on the right with displacement of 1 cm. No other pelvic fracture. Left hip appears normal. IMPRESSION: Complete femoral neck fracture on the right, displaced 1 cm. Electronically Signed   By: Paulina Fusi M.D.   On: 09/19/2015 22:15    Microbiology: Recent Results (from  the past 240 hour(s))  Surgical pcr screen     Status: None   Collection Time: 09/20/15  5:26 AM  Result Value Ref Range Status   MRSA, PCR NEGATIVE NEGATIVE Final   Staphylococcus aureus NEGATIVE NEGATIVE Final    Comment:        The Xpert SA Assay (FDA approved for NASAL specimens in patients over 89 years of age), is one component of a comprehensive surveillance program.  Test performance has been validated by [facility] for patients greater than or equal to 74 year old. It is not intended to diagnose infection nor to guide or monitor treatment.      Labs: Basic Metabolic Panel:  Recent Labs Lab 09/19/15 2212 09/21/15 0404 09/22/15 0417  NA 140 140 140  K 4.4 3.9 3.8  CL 102 104 105  CO2 27 29 24   GLUCOSE 118* 128* 122*  BUN 22* 21* 20  CREATININE 0.75 0.91 0.76  CALCIUM 9.3 8.6* 8.8*   Liver Function Tests: No results for input(s): AST, ALT, ALKPHOS, BILITOT, PROT, ALBUMIN in the last 168 hours. No results for input(s): LIPASE, AMYLASE in the last 168 hours. No results for input(s): AMMONIA in the last 168 hours. CBC:  Recent Labs Lab 09/19/15 2212 09/21/15 0404 09/22/15 0417 09/23/15 0421  WBC 16.6* 15.1* 12.8* 13.3*  NEUTROABS 13.6*  --   --   --   HGB 11.9* 10.0* 10.6* 9.7*  HCT 35.2* 29.4* 31.6* 28.9*  MCV 84.8 86.0 86.1 85.8  PLT 339 310 352 359   Cardiac Enzymes: No results for input(s): CKTOTAL, CKMB, CKMBINDEX, TROPONINI in the last 168 hours. BNP: BNP (last 3 results) No results for input(s): BNP in the last 8760 hours.  ProBNP (last 3  results) No results for input(s): PROBNP in the last 8760 hours.  CBG: No results for input(s): GLUCAP in the last 168 hours.  Discussed with Patient's son Mr. [name]. Updated care and answered questions.   Signed:  Marcellus Scott, MD, FACP, FHM. [facility] Pager [phone]  If 7PM-7AM, please contact night-coverage [email or web address] Password [facility] 09/23/2015, 12:41 PM

## 2015-09-23 NOTE — Progress Notes (Signed)
   Subjective: 3 Days Post-Op Procedure(s) (LRB): ARTHROPLASTY RIGHT  BIPOLAR ANTERIOR HIP (HEMIARTHROPLASTY) (Right)  Pt resting comfortably easily awakened Denies any new symptoms or issues Currently awaiting d/c to SNF Working with therapy daily Patient reports pain as mild.  Objective:   VITALS:   Filed Vitals:   09/22/15 1953 09/23/15 0641  BP: 151/58 128/39  Pulse: 71 79  Temp: 98.8 F (37.1 C) 98.8 F (37.1 C)  Resp: 18 18    Right hip incision healing well nv intact distally No rashes or edema Good rom  LABS  Recent Labs  09/21/15 0404 09/22/15 0417 09/23/15 0421  HGB 10.0* 10.6* 9.7*  HCT 29.4* 31.6* 28.9*  WBC 15.1* 12.8* 13.3*  PLT 310 352 359     Recent Labs  09/21/15 0404 09/22/15 0417  NA 140 140  K 3.9 3.8  BUN 21* 20  CREATININE 0.91 0.76  GLUCOSE 128* 122*     Assessment/Plan: 3 Days Post-Op Procedure(s) (LRB): ARTHROPLASTY RIGHT  BIPOLAR ANTERIOR HIP (HEMIARTHROPLASTY) (Right) Stable from orthopedic standpoint for d/c to SNF  F/u in 2 weeks Pulmonary toilet    Brad Mckaila Duffus, MPAS, PA-C  09/23/2015, 7:30 AM

## 2015-09-25 DIAGNOSIS — M79604 Pain in right leg: Secondary | ICD-10-CM | POA: Diagnosis not present

## 2015-09-25 DIAGNOSIS — M6281 Muscle weakness (generalized): Secondary | ICD-10-CM | POA: Diagnosis not present

## 2015-09-25 DIAGNOSIS — R2689 Other abnormalities of gait and mobility: Secondary | ICD-10-CM | POA: Diagnosis not present

## 2015-09-25 DIAGNOSIS — R5381 Other malaise: Secondary | ICD-10-CM | POA: Diagnosis not present

## 2015-09-25 IMAGING — DX DG HUMERUS 2V *R*
2 series · 2 of 2 positions shown · non-contrast
Comparison: Concurrently performed shoulder series.

CLINICAL DATA: Right arm pain after fall.

EXAM:
RIGHT HUMERUS - 2+ VIEW

[humerus ap]
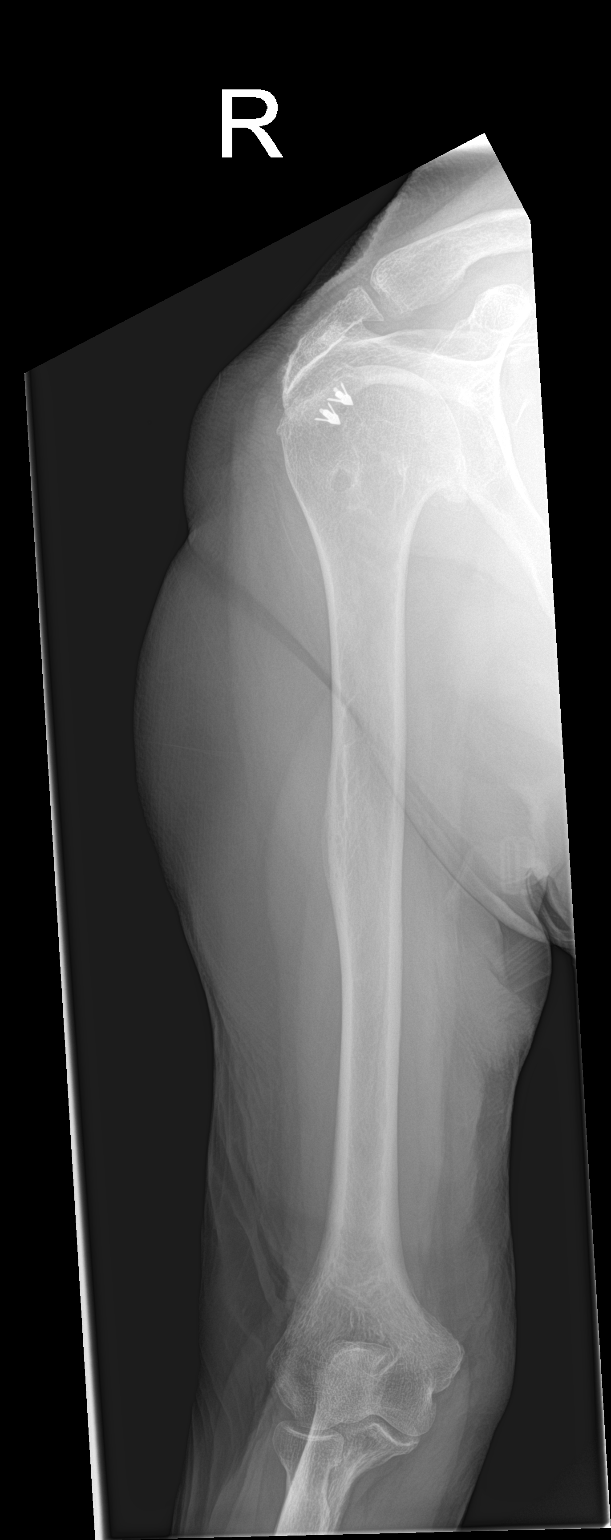

[humerus lat]
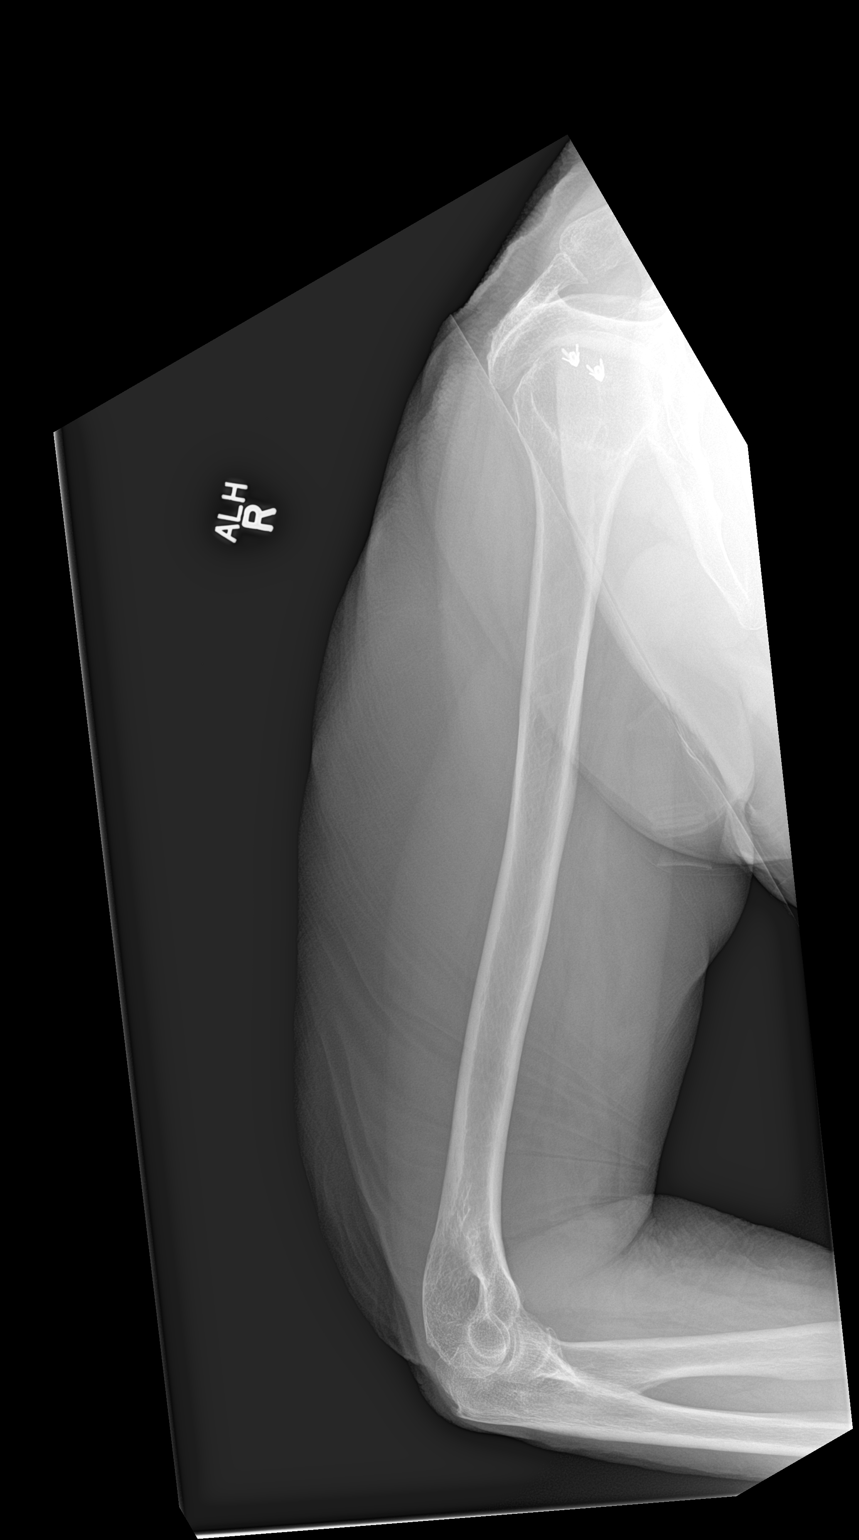

[2 of 2 positions shown; findings below may reference images not displayed]

FINDINGS: Cortical margins of the humerus are intact. There is no fracture.
Elbow and shoulder alignment is maintained. No focal soft tissue
abnormality.
IMPRESSION: Intact right humerus, no fracture.

## 2015-09-25 IMAGING — DX DG LUMBAR SPINE COMPLETE 4+V
5 series · 5 of 5 positions shown · non-contrast
Comparison: None.

CLINICAL DATA: Lumbar spine pain after fall.

EXAM:
LUMBAR SPINE - COMPLETE 4+ VIEW

[l-spine ap]
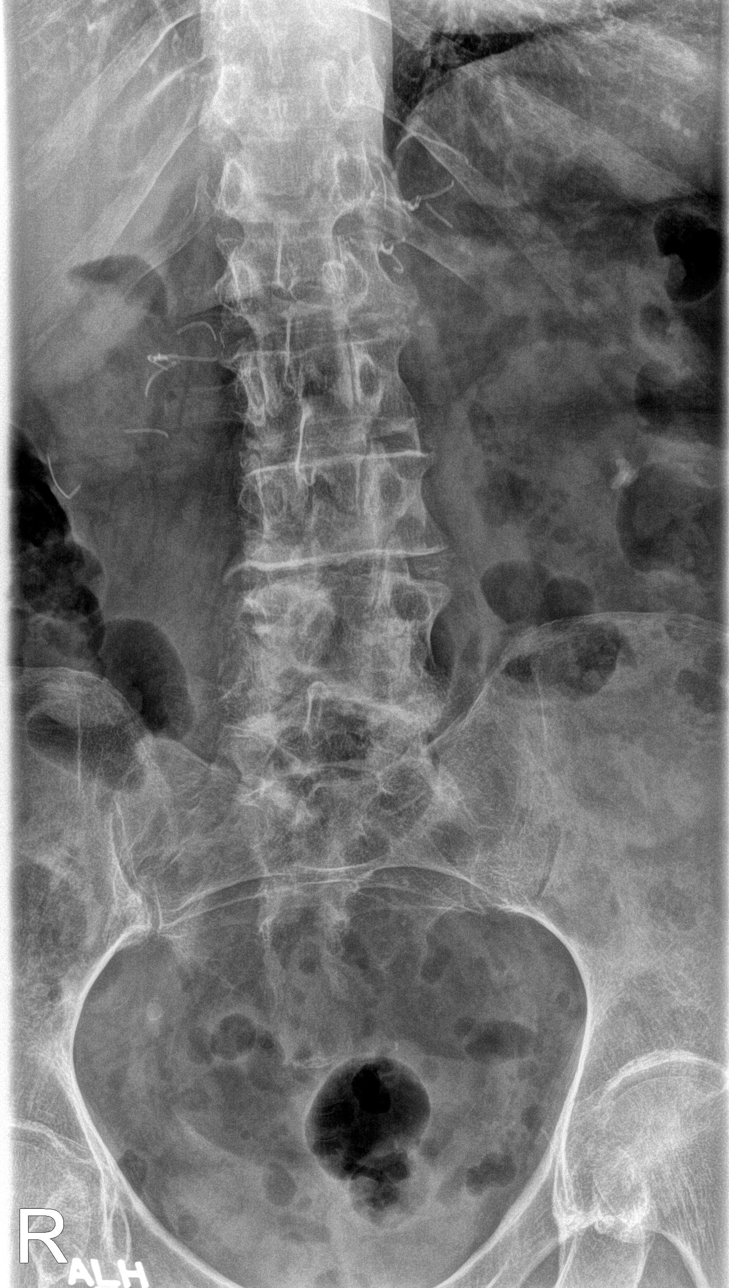

[l-spine obl (1 of 2)]
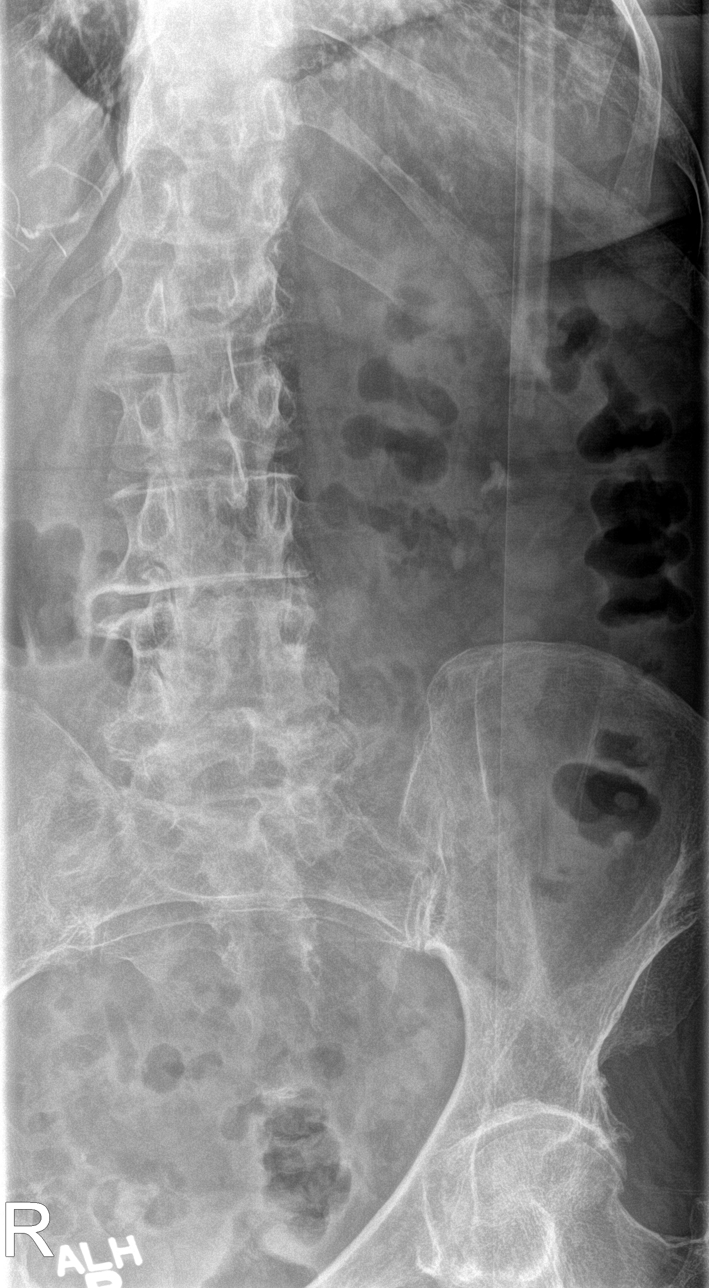

[l-spine obl (2 of 2)]
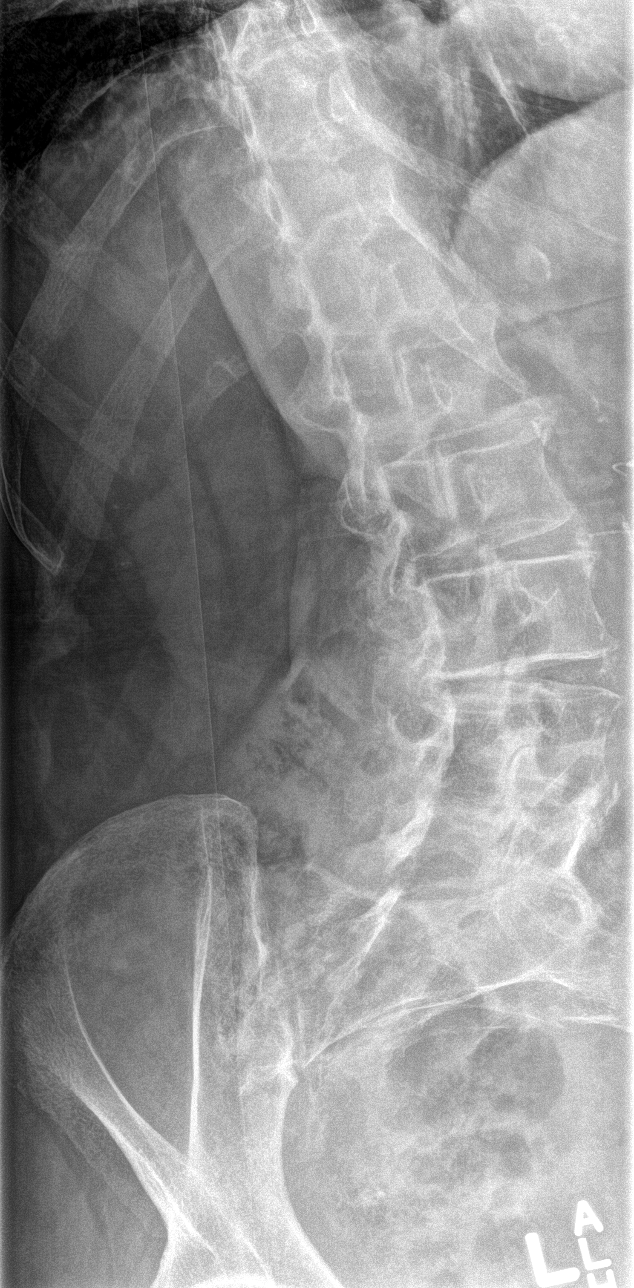

[l-spine lat]
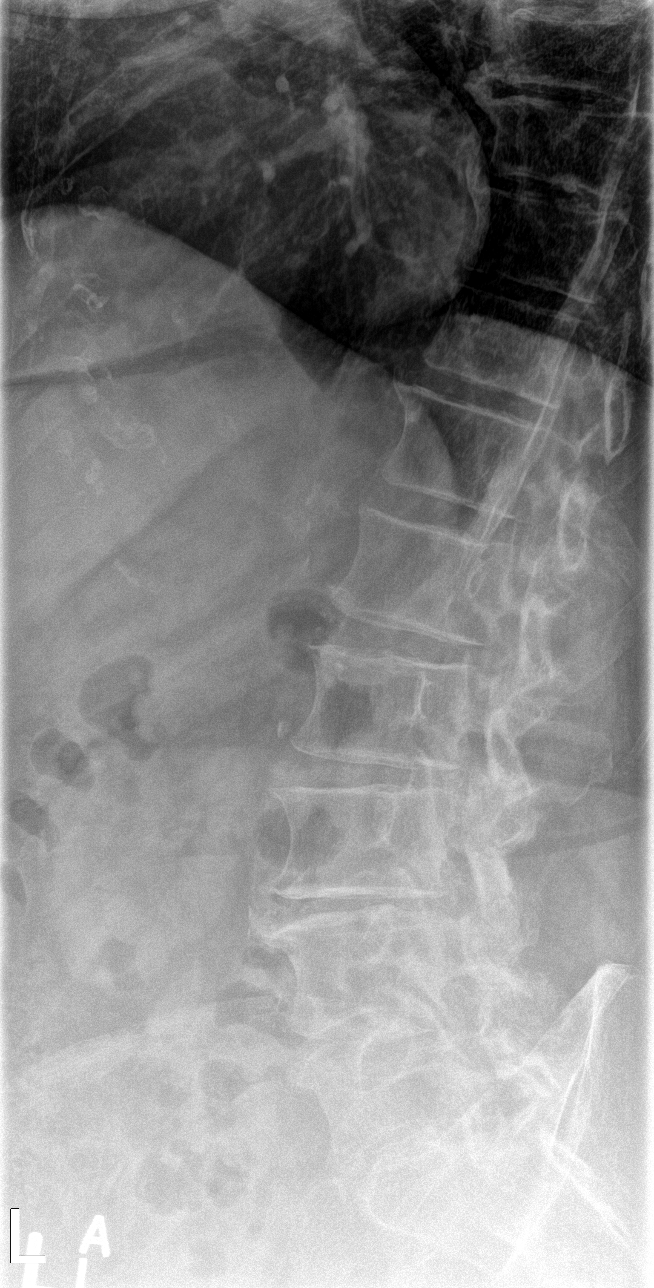

[l-spine spot]
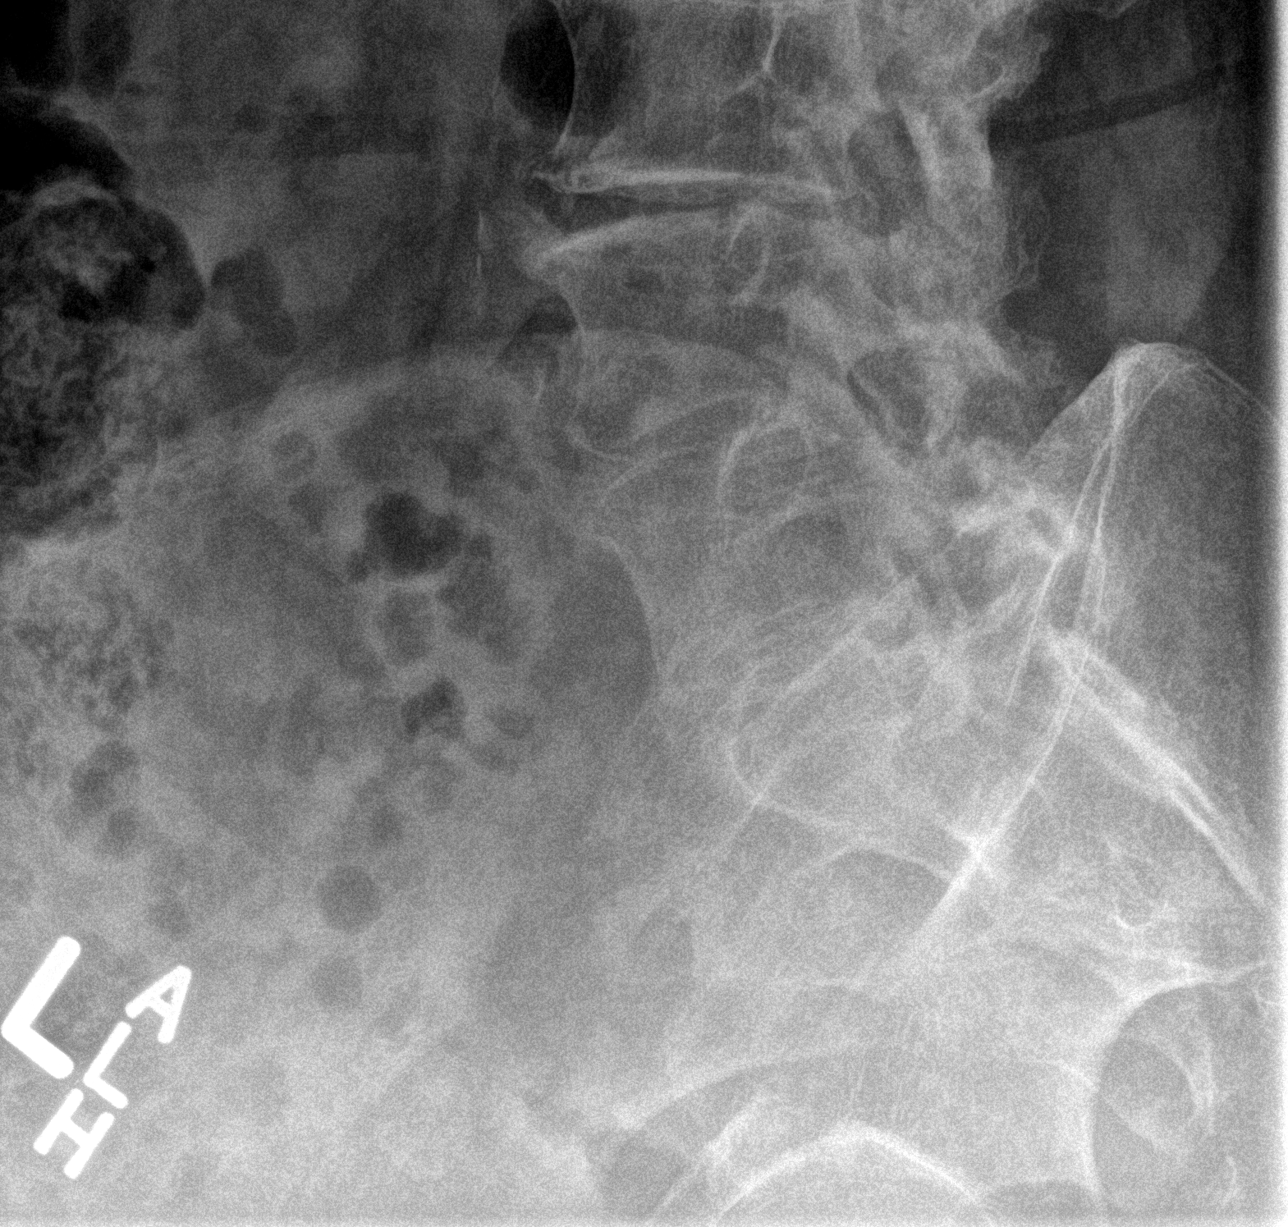

[5 of 5 positions shown; findings below may reference images not displayed]

FINDINGS: There is broad-based leftward curvature of the lumbar spine. Bones
are under mineralized. Vertebral body heights are maintained. No
acute fracture. There is diffuse multilevel degenerative disc
disease with disc space narrowing and endplate spurring throughout
the lumbar spine most significant at L3-L4. Multilevel facet
arthropathy. Posterior elements are grossly intact.
IMPRESSION: Multilevel degenerative change throughout the lumbar spine without
acute fracture.

## 2015-09-25 IMAGING — DX DG PELVIS 1-2V
1 series · 1 of 1 positions shown · non-contrast
Comparison: None.

CLINICAL DATA: Pelvic pain after fall.

EXAM:
PELVIS - 1-2 VIEW

[pelvis ap]
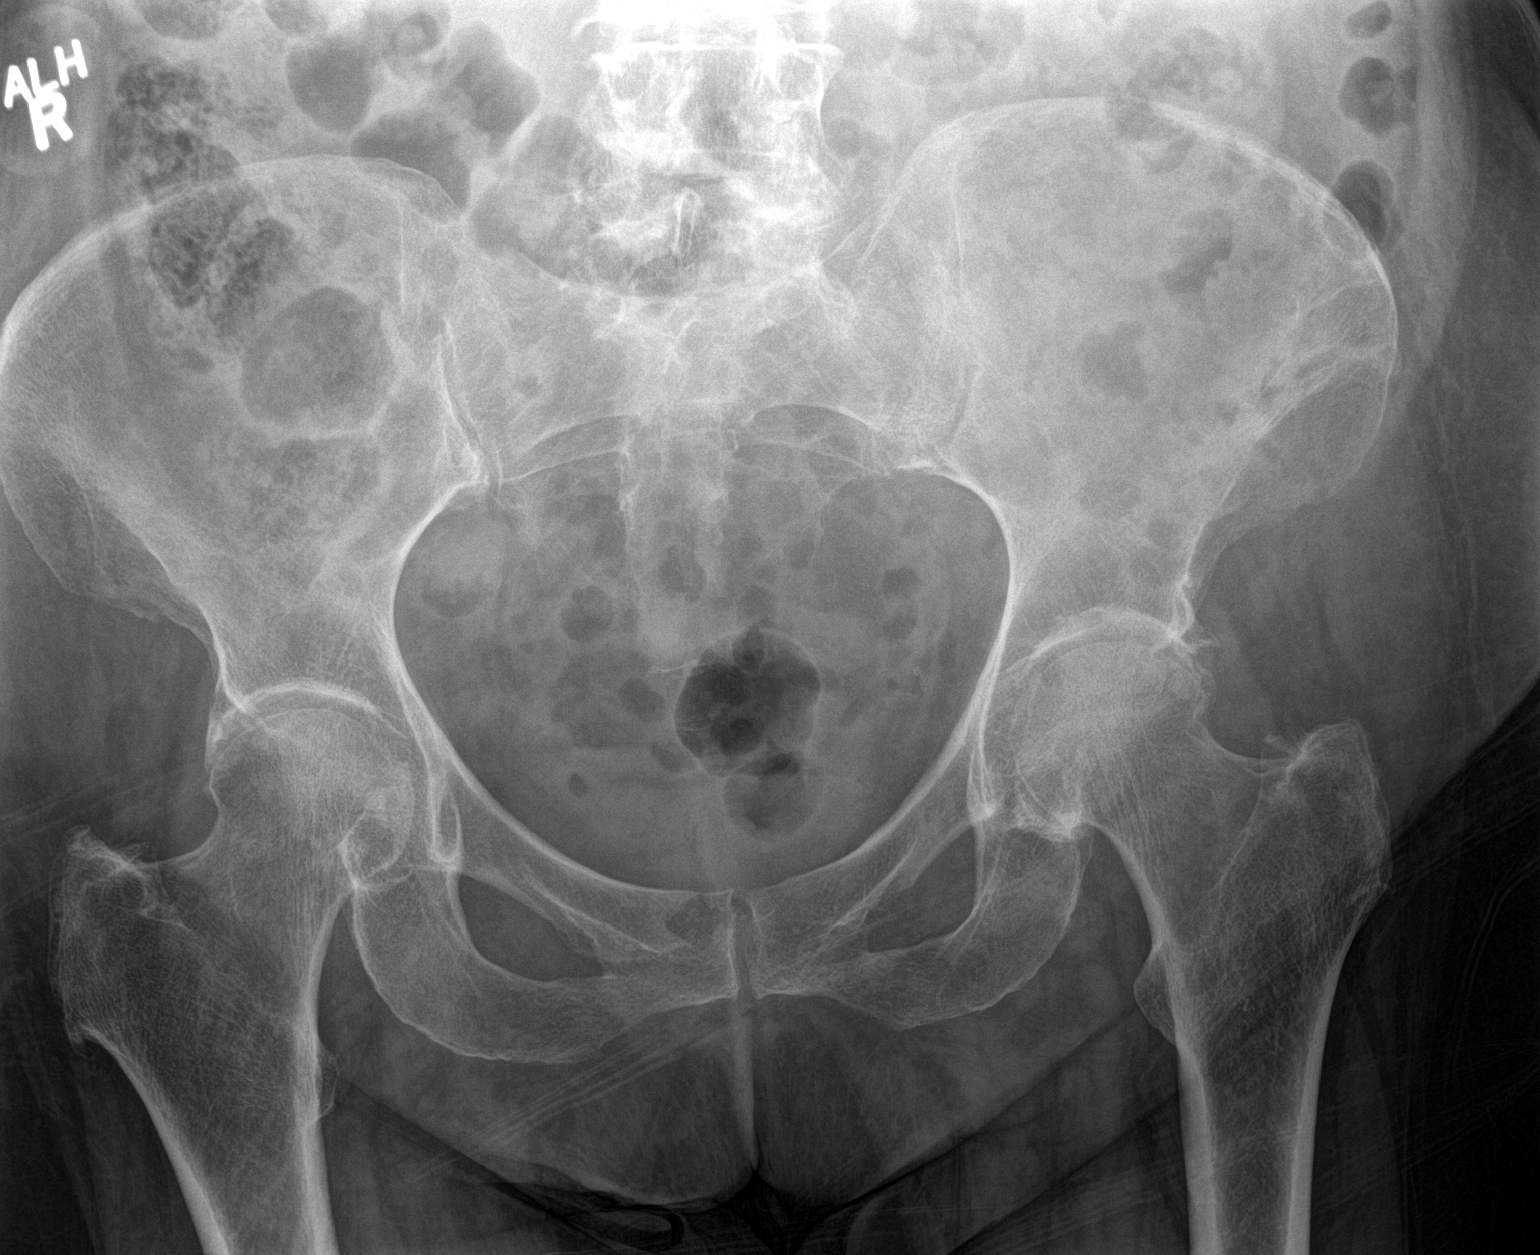

[1 of 1 positions shown; findings below may reference images not displayed]

FINDINGS: The cortical margins of the bony pelvis are intact. No fracture.
Pubic symphysis and sacroiliac joints are congruent. Both femoral
heads are well-seated in the respective acetabula. There is
osteoarthritis of both hip joints.
IMPRESSION: No pelvic fracture or dislocation.

## 2015-09-25 IMAGING — CT CT MAXILLOFACIAL W/O CM
3 of 5 series · 16 of 47 positions shown, 19 images · non-contrast
Comparison: Head CT 06/16/2014

CLINICAL DATA: [AGE] female fall out of bed. Head and facial
trauma.

EXAM:
CT HEAD WITHOUT CONTRAST
CT MAXILLOFACIAL WITHOUT CONTRAST
TECHNIQUE: Multidetector CT imaging of the head and maxillofacial structures
were performed using the standard protocol without intravenous
contrast. Multiplanar CT image reconstructions of the maxillofacial
structures were also generated.

[Series 301: facial bones, idose (1) · axial · 0.31mm/px · z∈[+24,+154]mm · 10 of 77 slices shown, 13 images]
[im 6/77  brain]
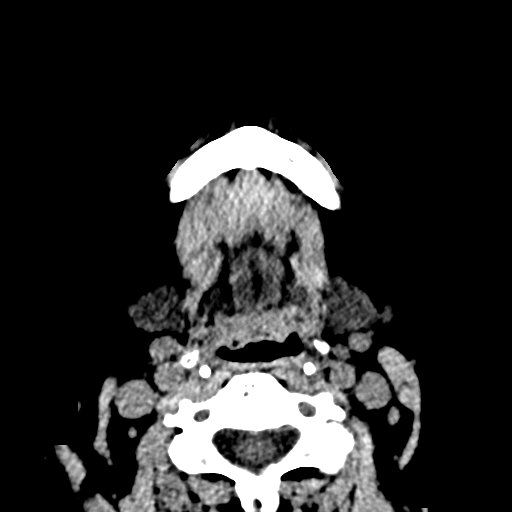
[im 6/77  bone]
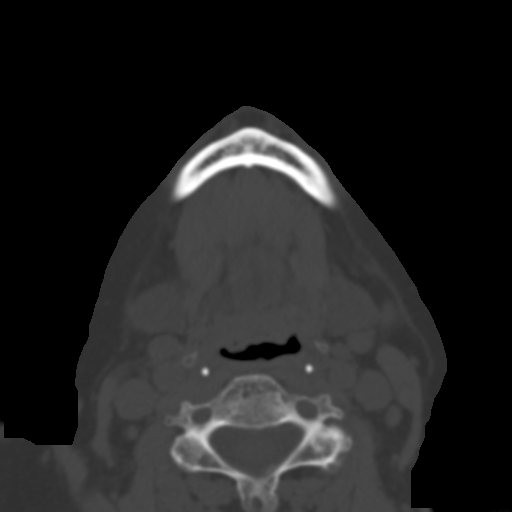
[im 12/77  bone]
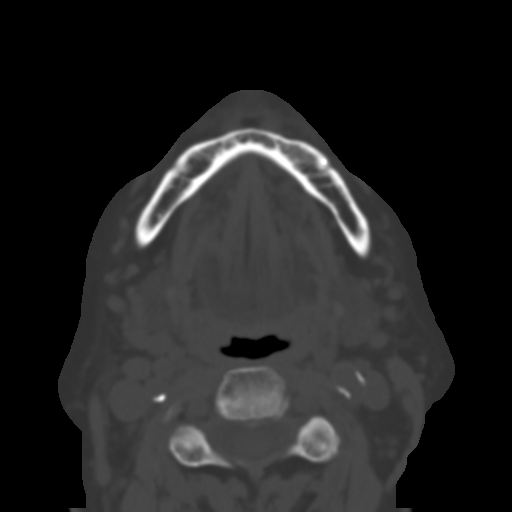
[im 24/77  bone]
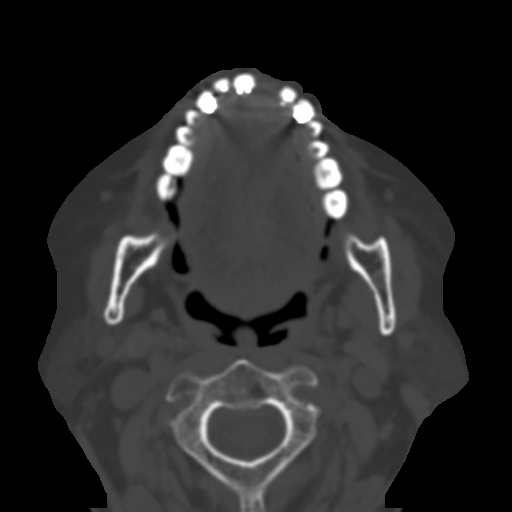
[im 30/77  bone]
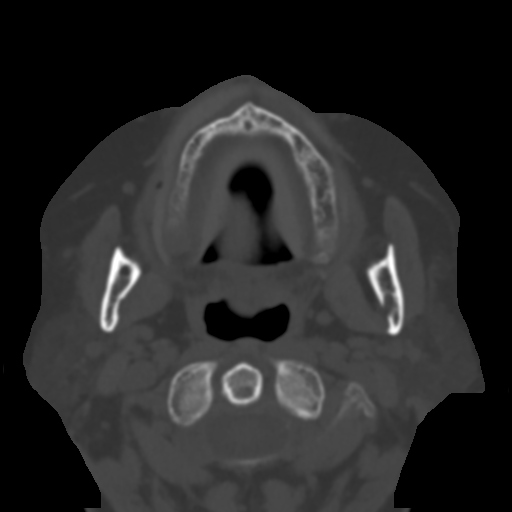
[im 36/77  brain]
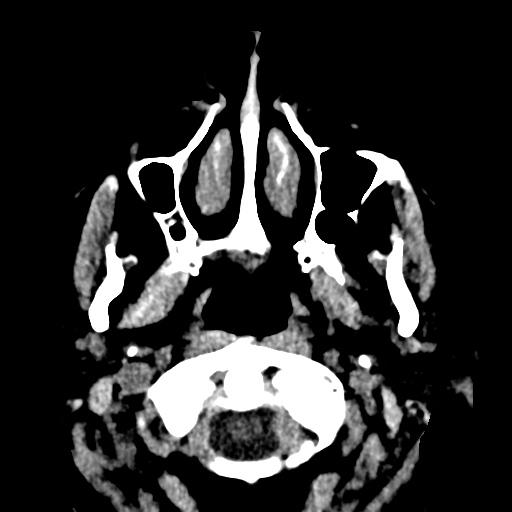
[im 36/77  bone]
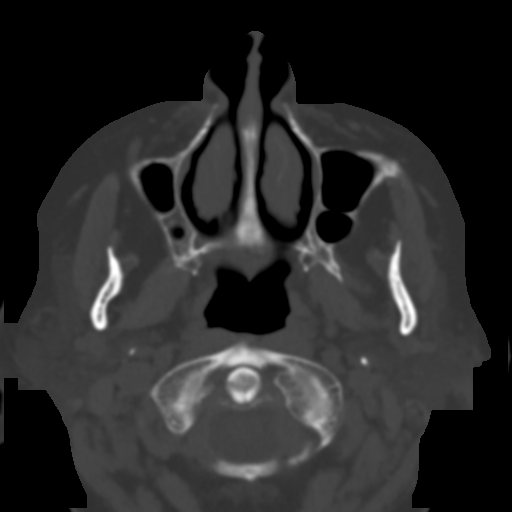
[im 41/77  bone]
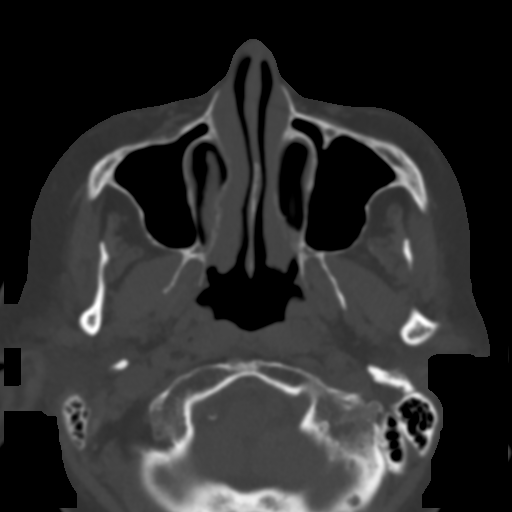
[im 47/77  bone]
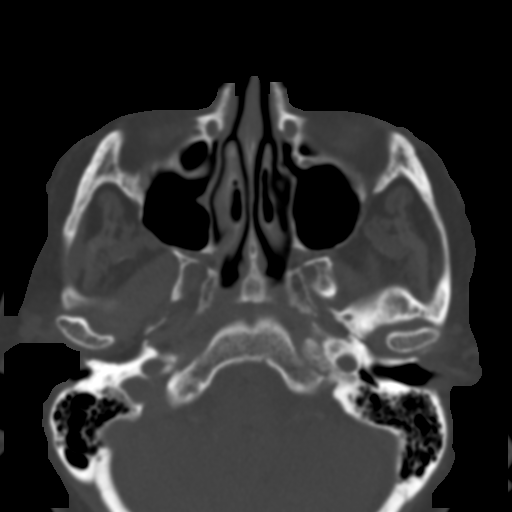
[im 59/77  bone]
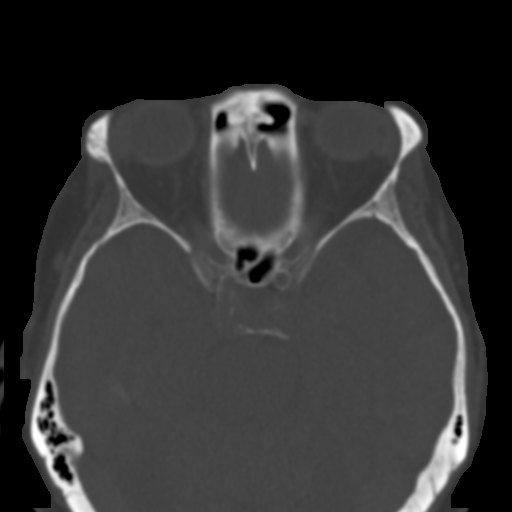
[im 65/77  brain]
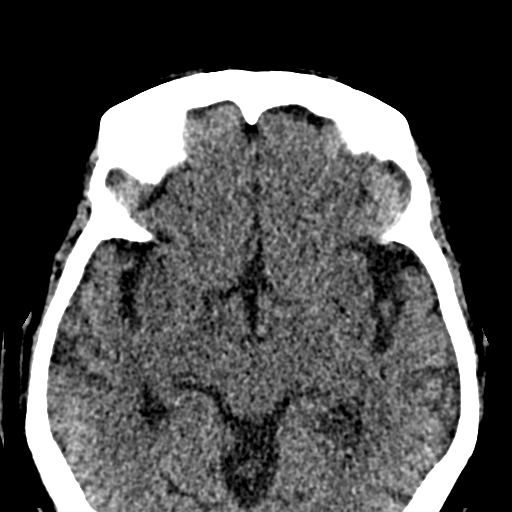
[im 65/77  bone]
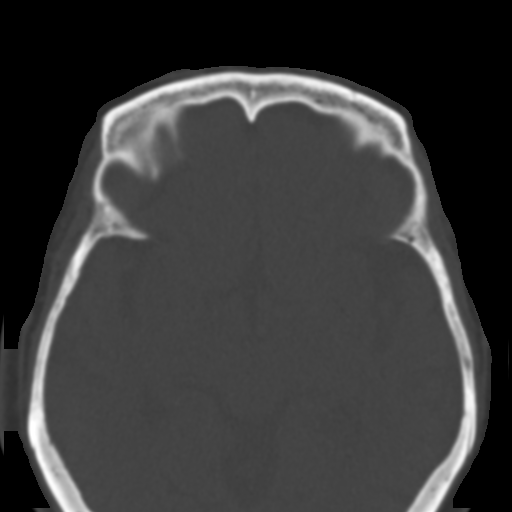
[im 71/77  bone]
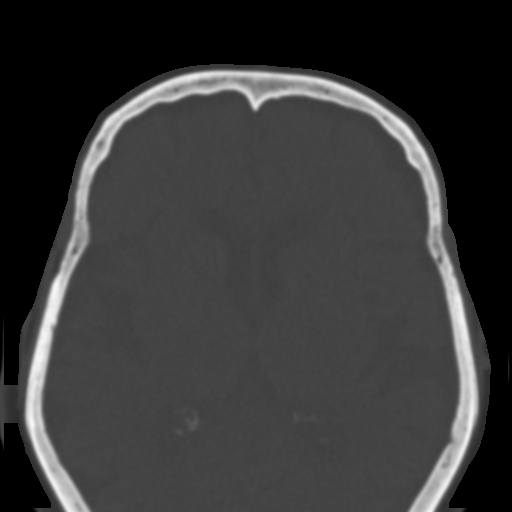

[Series 303: coronal std, idose (1) · coronal · 0.31mm/px · 3 of 78 slices shown]
[im 26/78  bone]
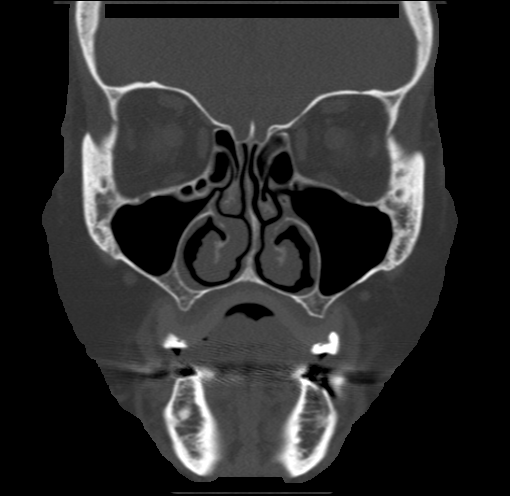
[im 35/78  bone]
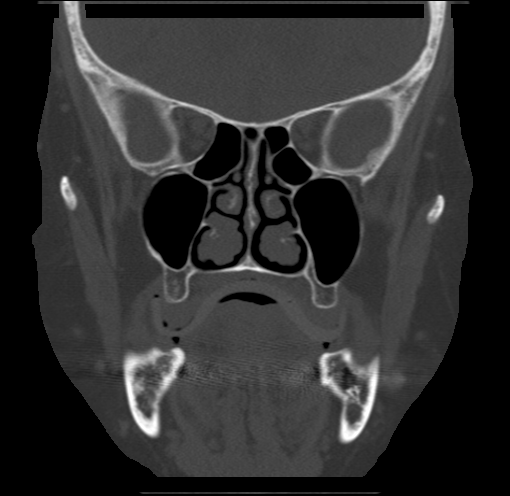
[im 43/78  bone]
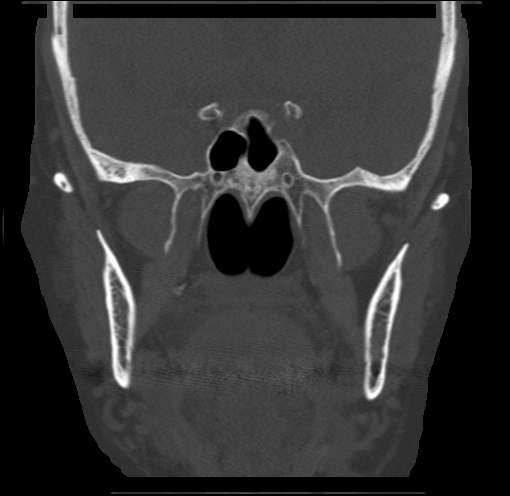

[Series 304: sagittal std, idose (1) · sagittal · 0.31mm/px · 3 of 79 slices shown]
[im 27/79  bone]
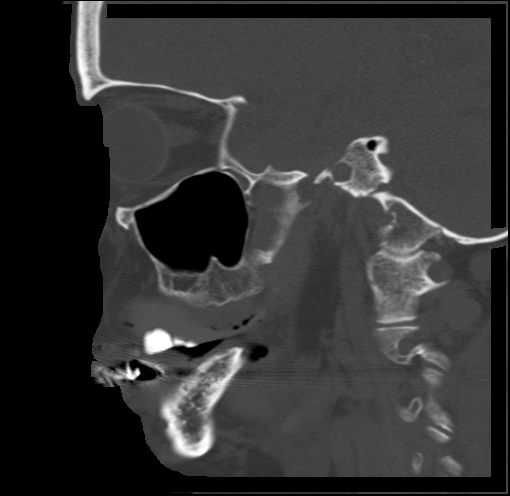
[im 40/79  bone]
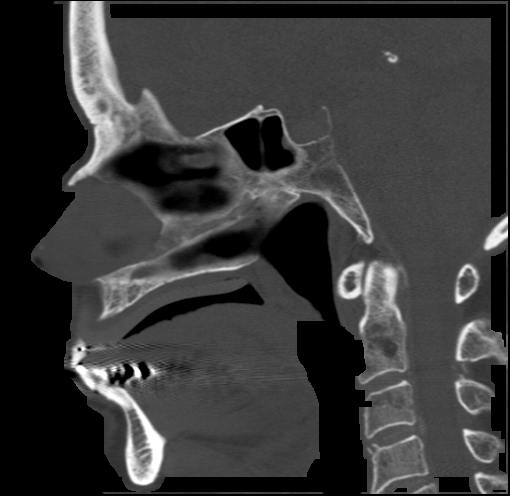
[im 53/79  bone]
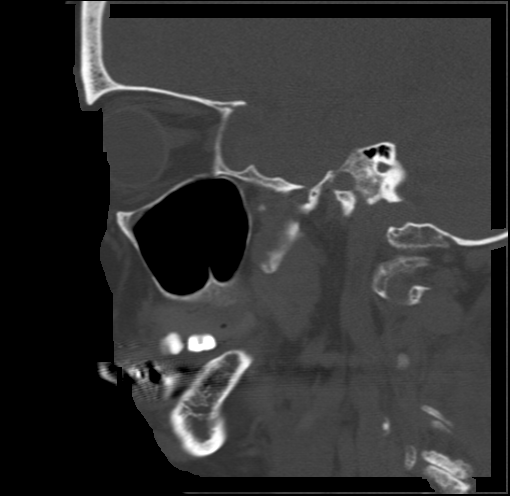

[16 of 47 positions shown; findings below may reference images not displayed]

FINDINGS: CT HEAD FINDINGS

No intracranial hemorrhage, mass effect, or midline shift. No
hydrocephalus. The basilar cisterns are patent. No evidence of
territorial infarct. No intracranial fluid collection. Generalized
age-related atrophy. Mild moderate chronic small vessel ischemia.
Calvarium is intact. Included paranasal sinuses and mastoid air
cells are well aerated.

CT MAXILLOFACIAL FINDINGS

There are no facial bone fractures. The orbits and globes are
intact. There is scattered mucosal thickening involving the left
sphenoid sinus and ethmoid air cells. The frontal sinuses are hypo
pneumatized. No focal soft tissue abnormality.
IMPRESSION: 1. Atrophy and chronic small vessel ischemic change. No acute
intracranial abnormality.
2. No facial bone fracture.

## 2015-09-25 IMAGING — CT CT HEAD W/O CM
1 series · 1 of 2 positions shown · non-contrast
Comparison: Head CT 06/16/2014

CLINICAL DATA: [AGE] female fall out of bed. Head and facial
trauma.

EXAM:
CT HEAD WITHOUT CONTRAST
CT MAXILLOFACIAL WITHOUT CONTRAST
TECHNIQUE: Multidetector CT imaging of the head and maxillofacial structures
were performed using the standard protocol without intravenous
contrast. Multiplanar CT image reconstructions of the maxillofacial
structures were also generated.

[Series 100: scout · sagittal · 0.6mm · 0.98mm/px · 1 of 2 slices shown]
[im 2/2]
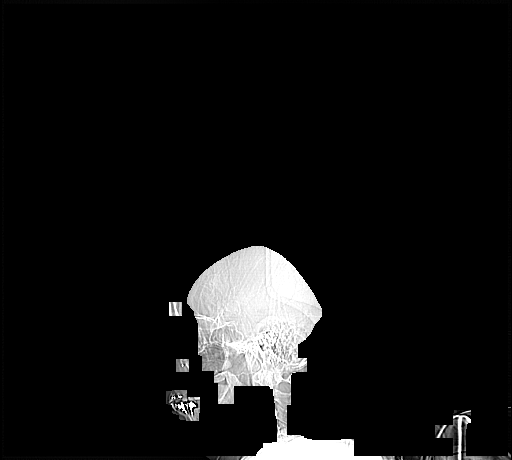

[1 of 2 positions shown; findings below may reference images not displayed]

FINDINGS: CT HEAD FINDINGS

No intracranial hemorrhage, mass effect, or midline shift. No
hydrocephalus. The basilar cisterns are patent. No evidence of
territorial infarct. No intracranial fluid collection. Generalized
age-related atrophy. Mild moderate chronic small vessel ischemia.
Calvarium is intact. Included paranasal sinuses and mastoid air
cells are well aerated.

CT MAXILLOFACIAL FINDINGS

There are no facial bone fractures. The orbits and globes are
intact. There is scattered mucosal thickening involving the left
sphenoid sinus and ethmoid air cells. The frontal sinuses are hypo
pneumatized. No focal soft tissue abnormality.
IMPRESSION: 1. Atrophy and chronic small vessel ischemic change. No acute
intracranial abnormality.
2. No facial bone fracture.

## 2015-09-25 IMAGING — DX DG KNEE COMPLETE 4+V*R*
4 series · 4 of 4 positions shown · non-contrast
Comparison: None.

CLINICAL DATA: Right knee pain after fall.

EXAM:
RIGHT KNEE - COMPLETE 4+ VIEW

[knee ap]
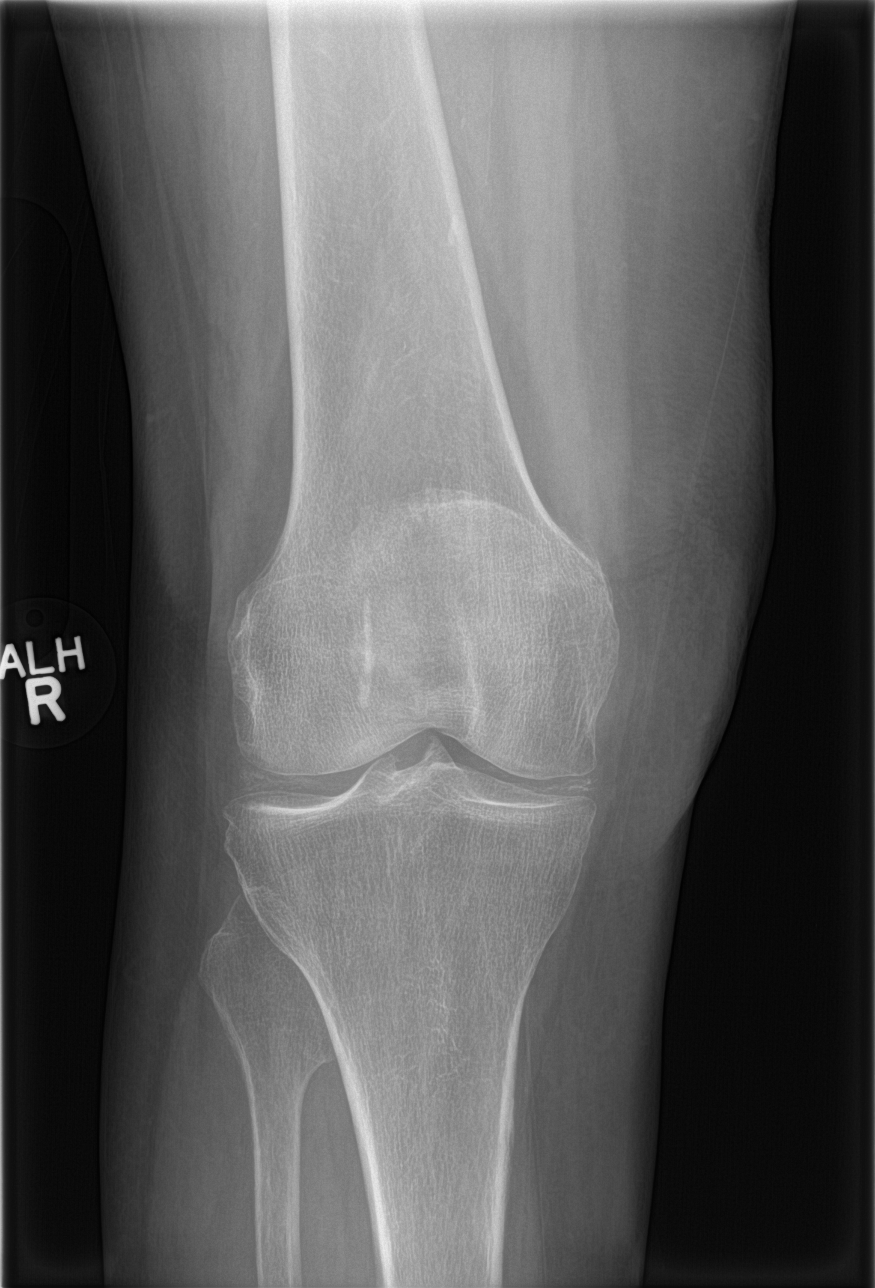

[knee obl (1 of 2)]
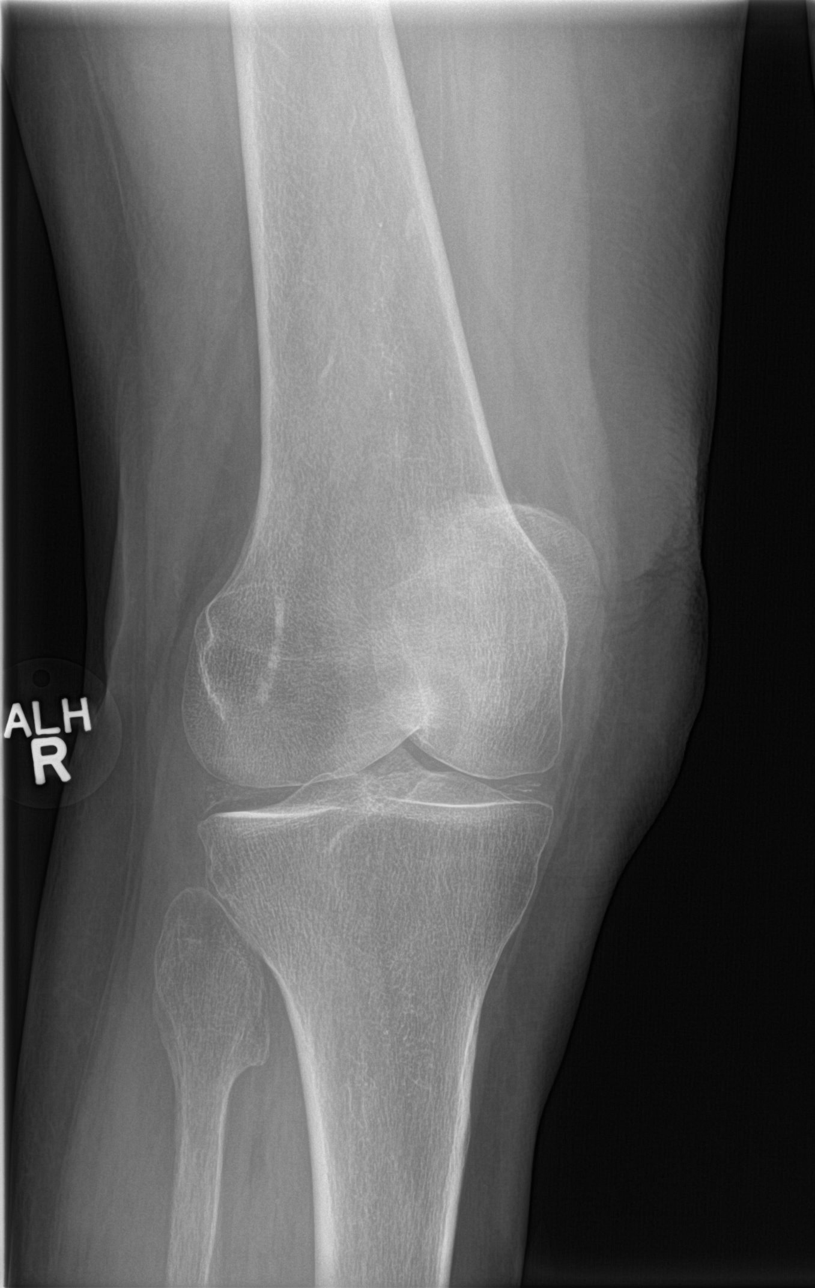

[knee obl (2 of 2)]
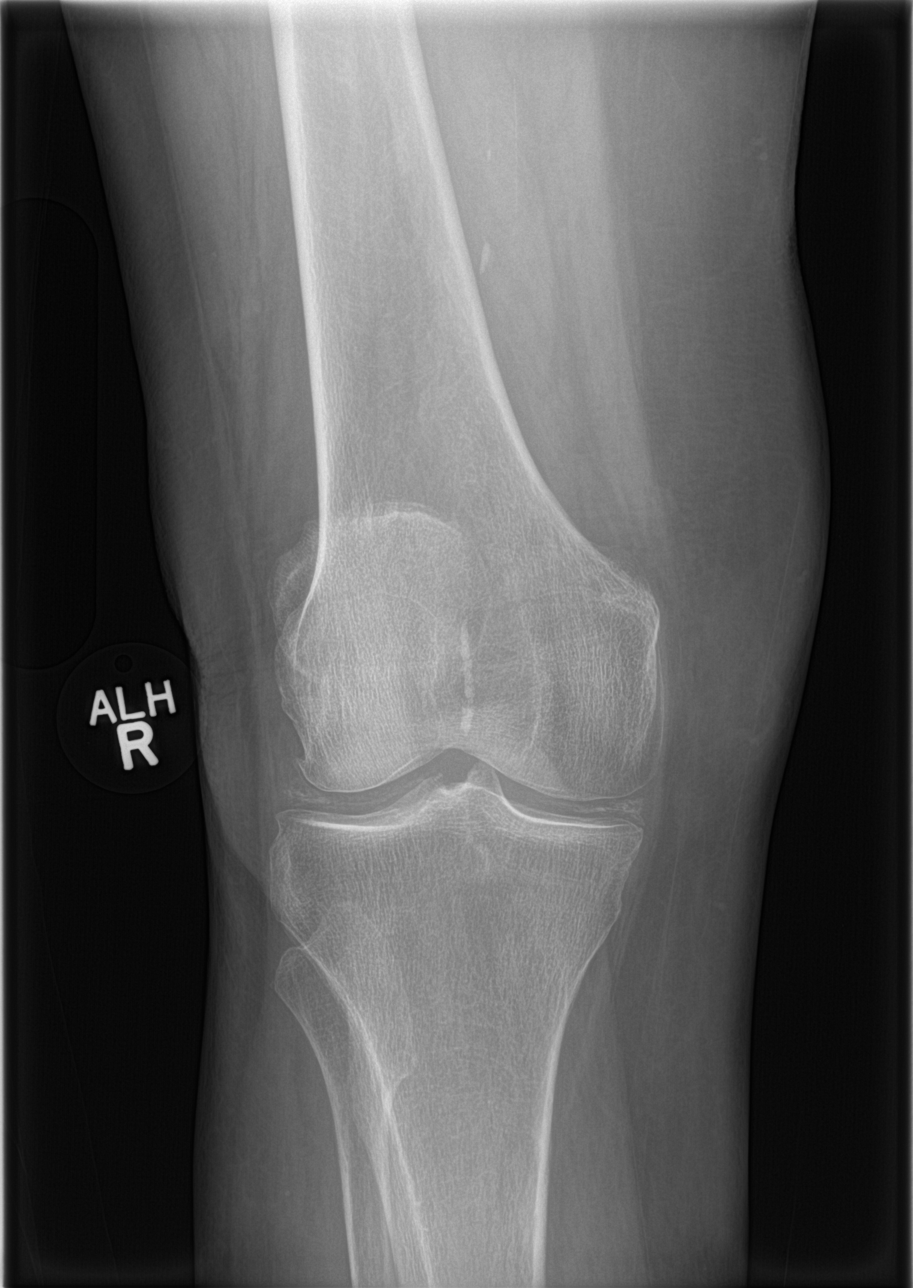

[knee lat]
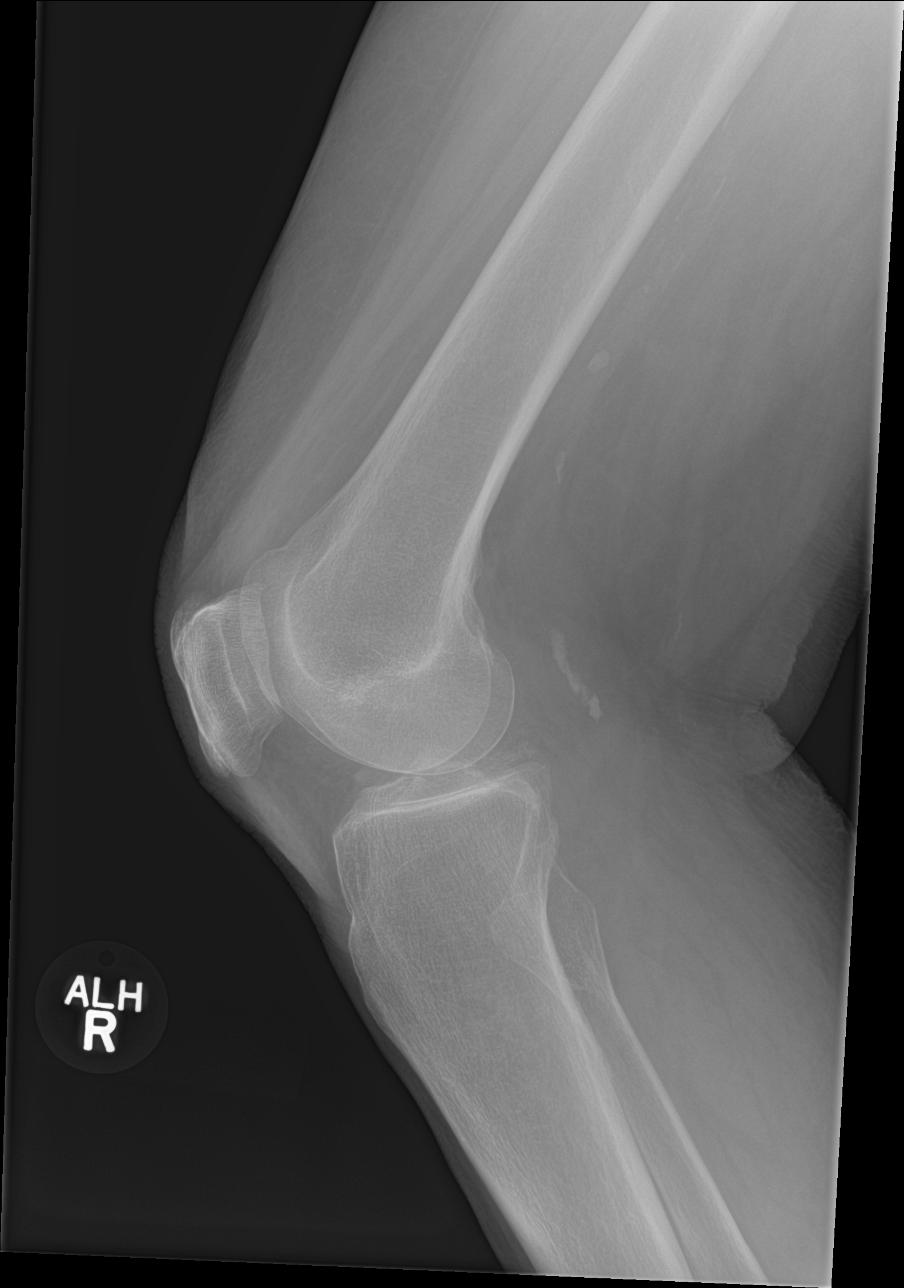

[4 of 4 positions shown; findings below may reference images not displayed]

FINDINGS: No fracture or dislocation. The alignment is maintained. There is
chondrocalcinosis. Small peripheral osteophytes. No joint effusion.
IMPRESSION: No acute fracture or dislocation of the right knee.

Mild osteoarthritis and chondrocalcinosis.

## 2015-10-05 DIAGNOSIS — R32 Unspecified urinary incontinence: Secondary | ICD-10-CM | POA: Diagnosis not present

## 2015-10-08 DIAGNOSIS — Z4789 Encounter for other orthopedic aftercare: Secondary | ICD-10-CM | POA: Diagnosis not present

## 2015-10-08 NOTE — Anesthesia Postprocedure Evaluation (Signed)
Anesthesia Post Note  Patient: [name]  Procedure(s) Performed: Procedure(s) (LRB): ARTHROPLASTY RIGHT  BIPOLAR ANTERIOR HIP (HEMIARTHROPLASTY) (Right)  Patient location during evaluation: PACU Anesthesia Type: Spinal Level of consciousness: awake Pain management: satisfactory to patient Vital Signs Assessment: post-procedure vital signs reviewed and stable Respiratory status: spontaneous breathing Cardiovascular status: blood pressure returned to baseline Postop Assessment: no headache and spinal receding Anesthetic complications: no     Last Vitals:  Filed Vitals:   09/22/15 1953 09/23/15 0641  BP: 151/58 128/39  Pulse: 71 79  Temp: 37.1 C 37.1 C  Resp: 18 18    Last Pain:  Filed Vitals:   09/23/15 0732  PainSc: Asleep   Pain Goal: Patients Stated Pain Goal: 2 (09/21/15 2106)               [name],[name]

## 2015-10-10 DIAGNOSIS — W19XXXA Unspecified fall, initial encounter: Secondary | ICD-10-CM | POA: Diagnosis not present

## 2015-10-10 DIAGNOSIS — M6281 Muscle weakness (generalized): Secondary | ICD-10-CM | POA: Diagnosis not present

## 2015-10-10 DIAGNOSIS — R451 Restlessness and agitation: Secondary | ICD-10-CM | POA: Diagnosis not present

## 2015-10-10 DIAGNOSIS — F419 Anxiety disorder, unspecified: Secondary | ICD-10-CM | POA: Diagnosis not present

## 2015-10-10 DIAGNOSIS — F0390 Unspecified dementia without behavioral disturbance: Secondary | ICD-10-CM | POA: Diagnosis not present

## 2015-10-15 DIAGNOSIS — F29 Unspecified psychosis not due to a substance or known physiological condition: Secondary | ICD-10-CM | POA: Diagnosis not present

## 2015-10-15 DIAGNOSIS — F329 Major depressive disorder, single episode, unspecified: Secondary | ICD-10-CM | POA: Diagnosis not present

## 2015-10-15 DIAGNOSIS — F0390 Unspecified dementia without behavioral disturbance: Secondary | ICD-10-CM | POA: Diagnosis not present

## 2015-10-15 DIAGNOSIS — F419 Anxiety disorder, unspecified: Secondary | ICD-10-CM | POA: Diagnosis not present

## 2015-10-16 DIAGNOSIS — L89153 Pressure ulcer of sacral region, stage 3: Secondary | ICD-10-CM | POA: Diagnosis not present

## 2015-10-16 DIAGNOSIS — L22 Diaper dermatitis: Secondary | ICD-10-CM | POA: Diagnosis not present

## 2015-10-17 DIAGNOSIS — G4700 Insomnia, unspecified: Secondary | ICD-10-CM | POA: Diagnosis not present

## 2015-10-17 DIAGNOSIS — R400 Somnolence: Secondary | ICD-10-CM | POA: Diagnosis not present

## 2015-10-17 DIAGNOSIS — F0390 Unspecified dementia without behavioral disturbance: Secondary | ICD-10-CM | POA: Diagnosis not present

## 2015-10-23 DIAGNOSIS — L22 Diaper dermatitis: Secondary | ICD-10-CM | POA: Diagnosis not present

## 2015-10-25 DIAGNOSIS — F419 Anxiety disorder, unspecified: Secondary | ICD-10-CM | POA: Diagnosis not present

## 2015-10-25 DIAGNOSIS — E039 Hypothyroidism, unspecified: Secondary | ICD-10-CM | POA: Diagnosis not present

## 2015-10-25 DIAGNOSIS — F0390 Unspecified dementia without behavioral disturbance: Secondary | ICD-10-CM | POA: Diagnosis not present

## 2015-10-25 DIAGNOSIS — I10 Essential (primary) hypertension: Secondary | ICD-10-CM | POA: Diagnosis not present

## 2015-10-25 DIAGNOSIS — Z96641 Presence of right artificial hip joint: Secondary | ICD-10-CM | POA: Diagnosis not present

## 2015-10-25 DIAGNOSIS — W19XXXA Unspecified fall, initial encounter: Secondary | ICD-10-CM | POA: Diagnosis not present

## 2015-10-25 DIAGNOSIS — F339 Major depressive disorder, recurrent, unspecified: Secondary | ICD-10-CM | POA: Diagnosis not present

## 2015-10-29 DIAGNOSIS — F0151 Vascular dementia with behavioral disturbance: Secondary | ICD-10-CM | POA: Diagnosis not present

## 2015-10-29 DIAGNOSIS — M84459D Pathological fracture, hip, unspecified, subsequent encounter for fracture with routine healing: Secondary | ICD-10-CM | POA: Diagnosis not present

## 2015-10-29 DIAGNOSIS — Z9181 History of falling: Secondary | ICD-10-CM | POA: Diagnosis not present

## 2015-10-31 DIAGNOSIS — F0280 Dementia in other diseases classified elsewhere without behavioral disturbance: Secondary | ICD-10-CM | POA: Diagnosis not present

## 2015-10-31 DIAGNOSIS — M150 Primary generalized (osteo)arthritis: Secondary | ICD-10-CM | POA: Diagnosis not present

## 2015-10-31 DIAGNOSIS — G309 Alzheimer's disease, unspecified: Secondary | ICD-10-CM | POA: Diagnosis not present

## 2015-10-31 DIAGNOSIS — I129 Hypertensive chronic kidney disease with stage 1 through stage 4 chronic kidney disease, or unspecified chronic kidney disease: Secondary | ICD-10-CM | POA: Diagnosis not present

## 2015-10-31 DIAGNOSIS — L89152 Pressure ulcer of sacral region, stage 2: Secondary | ICD-10-CM | POA: Diagnosis not present

## 2015-10-31 DIAGNOSIS — G8191 Hemiplegia, unspecified affecting right dominant side: Secondary | ICD-10-CM | POA: Diagnosis not present

## 2015-11-01 DIAGNOSIS — G309 Alzheimer's disease, unspecified: Secondary | ICD-10-CM | POA: Diagnosis not present

## 2015-11-01 DIAGNOSIS — M150 Primary generalized (osteo)arthritis: Secondary | ICD-10-CM | POA: Diagnosis not present

## 2015-11-01 DIAGNOSIS — L89152 Pressure ulcer of sacral region, stage 2: Secondary | ICD-10-CM | POA: Diagnosis not present

## 2015-11-01 DIAGNOSIS — G8191 Hemiplegia, unspecified affecting right dominant side: Secondary | ICD-10-CM | POA: Diagnosis not present

## 2015-11-01 DIAGNOSIS — I129 Hypertensive chronic kidney disease with stage 1 through stage 4 chronic kidney disease, or unspecified chronic kidney disease: Secondary | ICD-10-CM | POA: Diagnosis not present

## 2015-11-01 DIAGNOSIS — F0280 Dementia in other diseases classified elsewhere without behavioral disturbance: Secondary | ICD-10-CM | POA: Diagnosis not present

## 2015-11-03 DIAGNOSIS — G0430 Acute necrotizing hemorrhagic encephalopathy, unspecified: Secondary | ICD-10-CM | POA: Diagnosis not present

## 2015-11-04 DIAGNOSIS — G0430 Acute necrotizing hemorrhagic encephalopathy, unspecified: Secondary | ICD-10-CM | POA: Diagnosis not present

## 2015-11-05 DIAGNOSIS — S72001D Fracture of unspecified part of neck of right femur, subsequent encounter for closed fracture with routine healing: Secondary | ICD-10-CM | POA: Diagnosis not present

## 2015-11-05 DIAGNOSIS — I129 Hypertensive chronic kidney disease with stage 1 through stage 4 chronic kidney disease, or unspecified chronic kidney disease: Secondary | ICD-10-CM | POA: Diagnosis not present

## 2015-11-05 DIAGNOSIS — Z4789 Encounter for other orthopedic aftercare: Secondary | ICD-10-CM | POA: Diagnosis not present

## 2015-11-05 DIAGNOSIS — S7291XD Unspecified fracture of right femur, subsequent encounter for closed fracture with routine healing: Secondary | ICD-10-CM | POA: Diagnosis not present

## 2015-11-05 DIAGNOSIS — G8191 Hemiplegia, unspecified affecting right dominant side: Secondary | ICD-10-CM | POA: Diagnosis not present

## 2015-11-05 DIAGNOSIS — G0430 Acute necrotizing hemorrhagic encephalopathy, unspecified: Secondary | ICD-10-CM | POA: Diagnosis not present

## 2015-11-05 DIAGNOSIS — G309 Alzheimer's disease, unspecified: Secondary | ICD-10-CM | POA: Diagnosis not present

## 2015-11-05 DIAGNOSIS — M150 Primary generalized (osteo)arthritis: Secondary | ICD-10-CM | POA: Diagnosis not present

## 2015-11-05 DIAGNOSIS — F0280 Dementia in other diseases classified elsewhere without behavioral disturbance: Secondary | ICD-10-CM | POA: Diagnosis not present

## 2015-11-06 DIAGNOSIS — G0430 Acute necrotizing hemorrhagic encephalopathy, unspecified: Secondary | ICD-10-CM | POA: Diagnosis not present

## 2015-11-07 DIAGNOSIS — G8191 Hemiplegia, unspecified affecting right dominant side: Secondary | ICD-10-CM | POA: Diagnosis not present

## 2015-11-07 DIAGNOSIS — S7291XD Unspecified fracture of right femur, subsequent encounter for closed fracture with routine healing: Secondary | ICD-10-CM | POA: Diagnosis not present

## 2015-11-07 DIAGNOSIS — M150 Primary generalized (osteo)arthritis: Secondary | ICD-10-CM | POA: Diagnosis not present

## 2015-11-07 DIAGNOSIS — G0430 Acute necrotizing hemorrhagic encephalopathy, unspecified: Secondary | ICD-10-CM | POA: Diagnosis not present

## 2015-11-07 DIAGNOSIS — F0280 Dementia in other diseases classified elsewhere without behavioral disturbance: Secondary | ICD-10-CM | POA: Diagnosis not present

## 2015-11-07 DIAGNOSIS — I129 Hypertensive chronic kidney disease with stage 1 through stage 4 chronic kidney disease, or unspecified chronic kidney disease: Secondary | ICD-10-CM | POA: Diagnosis not present

## 2015-11-07 DIAGNOSIS — G309 Alzheimer's disease, unspecified: Secondary | ICD-10-CM | POA: Diagnosis not present

## 2015-11-08 DIAGNOSIS — G0430 Acute necrotizing hemorrhagic encephalopathy, unspecified: Secondary | ICD-10-CM | POA: Diagnosis not present

## 2015-11-09 DIAGNOSIS — G0430 Acute necrotizing hemorrhagic encephalopathy, unspecified: Secondary | ICD-10-CM | POA: Diagnosis not present

## 2015-11-10 DIAGNOSIS — G0430 Acute necrotizing hemorrhagic encephalopathy, unspecified: Secondary | ICD-10-CM | POA: Diagnosis not present

## 2015-11-11 ENCOUNTER — Emergency Department (HOSPITAL_COMMUNITY)
Admission: EM | Admit: 2015-11-11 | Discharge: 2015-11-11 | Disposition: A | Discharge status: Home / Self Care | Payer: Commercial Managed Care - HMO | Attending: Emergency Medicine | Admitting: Emergency Medicine

## 2015-11-11 ENCOUNTER — Encounter (HOSPITAL_COMMUNITY): Payer: Self-pay | Admitting: Emergency Medicine

## 2015-11-11 ENCOUNTER — Emergency Department (HOSPITAL_COMMUNITY): Payer: Commercial Managed Care - HMO

## 2015-11-11 DIAGNOSIS — Y939 Activity, unspecified: Secondary | ICD-10-CM | POA: Insufficient documentation

## 2015-11-11 DIAGNOSIS — N390 Urinary tract infection, site not specified: Secondary | ICD-10-CM | POA: Diagnosis not present

## 2015-11-11 DIAGNOSIS — M25552 Pain in left hip: Secondary | ICD-10-CM | POA: Diagnosis not present

## 2015-11-11 DIAGNOSIS — M25551 Pain in right hip: Secondary | ICD-10-CM | POA: Insufficient documentation

## 2015-11-11 DIAGNOSIS — Z96641 Presence of right artificial hip joint: Secondary | ICD-10-CM | POA: Diagnosis not present

## 2015-11-11 DIAGNOSIS — N189 Chronic kidney disease, unspecified: Secondary | ICD-10-CM | POA: Diagnosis not present

## 2015-11-11 DIAGNOSIS — S73003A Unspecified subluxation of unspecified hip, initial encounter: Secondary | ICD-10-CM | POA: Diagnosis not present

## 2015-11-11 DIAGNOSIS — W1830XA Fall on same level, unspecified, initial encounter: Secondary | ICD-10-CM | POA: Insufficient documentation

## 2015-11-11 DIAGNOSIS — Z7982 Long term (current) use of aspirin: Secondary | ICD-10-CM | POA: Diagnosis not present

## 2015-11-11 DIAGNOSIS — M25559 Pain in unspecified hip: Secondary | ICD-10-CM | POA: Diagnosis not present

## 2015-11-11 DIAGNOSIS — J45909 Unspecified asthma, uncomplicated: Secondary | ICD-10-CM | POA: Diagnosis not present

## 2015-11-11 DIAGNOSIS — I129 Hypertensive chronic kidney disease with stage 1 through stage 4 chronic kidney disease, or unspecified chronic kidney disease: Secondary | ICD-10-CM | POA: Diagnosis not present

## 2015-11-11 DIAGNOSIS — Y999 Unspecified external cause status: Secondary | ICD-10-CM | POA: Insufficient documentation

## 2015-11-11 DIAGNOSIS — G0430 Acute necrotizing hemorrhagic encephalopathy, unspecified: Secondary | ICD-10-CM | POA: Diagnosis not present

## 2015-11-11 DIAGNOSIS — Z87891 Personal history of nicotine dependence: Secondary | ICD-10-CM | POA: Insufficient documentation

## 2015-11-11 DIAGNOSIS — W19XXXA Unspecified fall, initial encounter: Secondary | ICD-10-CM

## 2015-11-11 DIAGNOSIS — M25562 Pain in left knee: Secondary | ICD-10-CM | POA: Diagnosis not present

## 2015-11-11 DIAGNOSIS — M25561 Pain in right knee: Secondary | ICD-10-CM | POA: Diagnosis not present

## 2015-11-11 DIAGNOSIS — Y929 Unspecified place or not applicable: Secondary | ICD-10-CM | POA: Diagnosis not present

## 2015-11-11 DIAGNOSIS — S8991XA Unspecified injury of right lower leg, initial encounter: Secondary | ICD-10-CM | POA: Diagnosis not present

## 2015-11-11 DIAGNOSIS — M2550 Pain in unspecified joint: Secondary | ICD-10-CM

## 2015-11-11 LAB — URINALYSIS, ROUTINE W REFLEX MICROSCOPIC
Bilirubin Urine: NEGATIVE
Glucose, UA: NEGATIVE mg/dL
Ketones, ur: NEGATIVE mg/dL
NITRITE: NEGATIVE
PROTEIN: NEGATIVE mg/dL
Specific Gravity, Urine: 1.011 (ref 1.005–1.030)
pH: 6.5 (ref 5.0–8.0)

## 2015-11-11 LAB — URINE MICROSCOPIC-ADD ON

## 2015-11-11 MED ORDER — CEPHALEXIN 500 MG PO CAPS
500.0000 mg | ORAL_CAPSULE | Freq: Once | ORAL | Status: AC
  Administered 2015-11-11: 500 mg via ORAL
  Filled 2015-11-11: qty 1

## 2015-11-11 MED ORDER — CEPHALEXIN 500 MG PO CAPS: 500.0000 mg | ORAL_CAPSULE | Freq: Two times a day (BID) | ORAL | Status: DC

## 2015-11-11 NOTE — ED Notes (Signed)
[facility] called for transport.  

## 2015-11-11 NOTE — ED Provider Notes (Signed)
CSN: [id number]     Arrival date & time 11/11/15  1351 History   First MD Initiated Contact with Patient 11/11/15 1353     Chief Complaint  Patient presents with  . Fall  . Hip Pain     (Consider location/radiation/quality/duration/timing/severity/associated sxs/prior Treatment) HPI Patient with history of unsteady gait and multiple falls. Recent right hip replacement surgery. Patient had a witnessed fall from standing. No head or neck injury. No loss of consciousness. Patient does have history of dementia but states she can remember the fall. Was in normal state of health prior to the fall. She now complains of bilateral hip pain as well as bilateral knee pain. No focal weakness or numbness. Past Medical History  Diagnosis Date  . Arthritis   . Chicken pox   . Chronic kidney disease   . Colon polyps   . Hypertension   . Dementia   . Angina   . Dysrhythmia   . Heart murmur   . Shortness of breath   . Recurrent upper respiratory infection (URI)   . Pneumonia   . Hypothyroidism   . Anemia   . Headache(784.0)   . Anxiety   . Depression   . Valvular heart disease 09/04/2012  . Left leg pain 10/02/2012  . UTI (urinary tract infection) 12/30/2012  . Acute bronchitis 04/10/2013  . Dehydration 08/31/2013  . Arthritis 10/02/2012  . Asthma    Past Surgical History  Procedure Laterality Date  . Appendectomy    . Cholecystectomy    . Kidney stones    . Tonsillectomy    . Abdominal hysterectomy    . Hand surgery    . Feet surgery    . Shoulder arthroscopy    . Back surgery    . Tonsillectomy    . Eye surgery      cataract removed and eye lids lifted  . Fracture surgery      bilateral arms  . Tubal ligation    . Colonoscopy w/ polypectomy    . Hip arthroplasty Right 09/20/2015    Procedure: ARTHROPLASTY RIGHT  BIPOLAR ANTERIOR HIP (HEMIARTHROPLASTY);  Surgeon: Samson Frederic, MD;  Location: [facility];  Service: Orthopedics;  Laterality: Right;   Family History  Problem Relation Age  of Onset  . Heart disease Mother   . Heart disease Father   . Heart disease Other   . Birth defects Other    Social History  Substance Use Topics  . Smoking status: Former Smoker -- 0.20 packs/day for 1 years    Types: Cigarettes    Quit date: 06/02/1941  . Smokeless tobacco: Never Used  . Alcohol Use: No   OB History    No data available     Review of Systems  Constitutional: Negative for fever and chills.  Respiratory: Negative for shortness of breath.   Cardiovascular: Negative for chest pain.  Gastrointestinal: Negative for nausea, vomiting and abdominal pain.  Musculoskeletal: Positive for arthralgias. Negative for back pain and neck pain.  Skin: Negative for rash and wound.  Neurological: Negative for dizziness, syncope, weakness, numbness and headaches.  All other systems reviewed and are negative.     Allergies  Morphine and related; Codeine; and Hydrocodone  Home Medications   Prior to Admission medications   Medication Sig Start Date End Date Taking? Authorizing Provider  acetaminophen (TYLENOL) 325 MG tablet Take 650 mg by mouth every 6 (six) hours as needed for moderate pain or fever.   Yes Historical Provider, MD  acetaminophen (TYLENOL) 500 MG tablet Take 500 mg by mouth every 4 (four) hours as needed for mild pain, moderate pain or fever.   Yes Historical Provider, MD  alum & mag hydroxide-simeth (MINTOX) 200-200-20 MG/5ML suspension Take 30 mLs by mouth 4 (four) times daily as needed for indigestion or heartburn.    Yes Historical Provider, MD  aspirin 81 MG chewable tablet Chew 81 mg by mouth daily.   Yes Historical Provider, MD  clonazePAM (KLONOPIN) 0.5 MG tablet Take 1 tablet (0.5 mg total) by mouth 2 (two) times daily. 09/23/15  Yes Elease Etienne, MD  escitalopram (LEXAPRO) 10 MG tablet Take 10 mg by mouth daily.   Yes Historical Provider, MD  guaifenesin (ROBITUSSIN) 100 MG/5ML syrup Take 200 mg by mouth 2 (two) times daily as needed for cough.    Yes Historical Provider, MD  levothyroxine (SYNTHROID, LEVOTHROID) 50 MCG tablet TAKE 1 TABLET (50 MCG TOTAL) BY MOUTH DAILY. 09/25/14  Yes Myrlene Broker, MD  loperamide (IMODIUM) 2 MG capsule Take 2 mg by mouth as needed for diarrhea or loose stools (do not exceed 8 doses in 24 hours.).    Yes Historical Provider, MD  magnesium hydroxide (MILK OF MAGNESIA) 400 MG/5ML suspension Take 30 mLs by mouth at bedtime as needed for mild constipation.   Yes Historical Provider, MD  Multiple Vitamins-Minerals (MULTIVITAMIN WITH MINERALS) tablet Take 1 tablet by mouth daily.   Yes Historical Provider, MD  neomycin-bacitracin-polymyxin (NEOSPORIN) ointment Apply 1 application topically daily as needed for wound care.    Yes Historical Provider, MD  PRESCRIPTION MEDICATION Take 1 each by mouth 3 (three) times daily. Mighty Shakes   Yes Historical Provider, MD  rivastigmine (EXELON) 3 MG capsule Take 3 mg by mouth 2 (two) times daily.   Yes Historical Provider, MD  senna-docusate (SENEXON-S) 8.6-50 MG tablet Take 2 tablets by mouth 2 (two) times daily.    Yes Historical Provider, MD  traMADol (ULTRAM) 50 MG tablet Take 1 tablet (50 mg total) by mouth every 6 (six) hours as needed for severe pain. 09/23/15  Yes Elease Etienne, MD  traZODone (DESYREL) 50 MG tablet Take 50 mg by mouth at bedtime.    Yes Historical Provider, MD  aspirin EC 325 MG EC tablet Take 1 tablet (325 mg total) by mouth 2 (two) times daily after a meal. To be taken for 30 days postop (surgery date: 09/20/15). After the 30 days period, change to prior dose of 81 mg daily. Patient not taking: Reported on 11/11/2015 09/23/15   Elease Etienne, MD  cephALEXin (KEFLEX) 500 MG capsule Take 1 capsule (500 mg total) by mouth 2 (two) times daily. 11/11/15   Loren Racer, MD   BP 150/61 mmHg  Pulse 61  Temp(Src) 97.7 F (36.5 C) (Oral)  Resp 18  SpO2 98% Physical Exam  Constitutional: She appears well-developed and well-nourished. No  distress.  HENT:  Head: Normocephalic and atraumatic.  Mouth/Throat: Oropharynx is clear and moist.  No intraoral trauma  Eyes: EOM are normal. Pupils are equal, round, and reactive to light.  Neck: Normal range of motion. Neck supple.  No posterior midline cervical tenderness to palpation.  Cardiovascular: Normal rate and regular rhythm.  Exam reveals no gallop and no friction rub.   No murmur heard. Pulmonary/Chest: Effort normal and breath sounds normal. No respiratory distress. She has no wheezes. She has no rales. She exhibits no tenderness.  Abdominal: Soft. Bowel sounds are normal. She exhibits no distension and  no mass. There is no tenderness. There is no rebound and no guarding.  Musculoskeletal: She exhibits tenderness. She exhibits no edema.  No midline thoracic or lumbar tenderness. Pelvis is stable. Patient does have tenderness to palpation over the lateral surface of the left hip and pain with range of motion. She also has tenderness to palpation of the medial surface of the left knee. There is no obvious effusion or deformity. Patient with tenderness to palpation over the lateral surface of the right knee. No obvious deformity or swelling. Pain with range of motion of the right hip. There does not appear to be any discrepancy in lower extremity length. Dorsalis pedis and posterior tibial pulses are 2+ bilaterally.  Neurological: She is alert.  Oriented to person and place.Knows that it is spring but does not know the month. Follows commands. 5/5 motor in all extremities. Sensation is fully intact.  Skin: Skin is warm and dry. No rash noted. No erythema.  Nursing note and vitals reviewed.   ED Course  Procedures (including critical care time) Labs Review Labs Reviewed  URINALYSIS, ROUTINE W REFLEX MICROSCOPIC (NOT AT [facility]) - Abnormal; Notable for the following:    APPearance HAZY (*)    Hgb urine dipstick TRACE (*)    Leukocytes, UA LARGE (*)    All other components within  normal limits  URINE MICROSCOPIC-ADD ON - Abnormal; Notable for the following:    Squamous Epithelial / LPF 6-30 (*)    Bacteria, UA FEW (*)    All other components within normal limits    Imaging Review Dg Knee Complete 4 Views Left  11/11/2015  CLINICAL DATA:  Bilateral hip and knee pain.  Fall. EXAM: LEFT KNEE - COMPLETE 4+ VIEW COMPARISON:  None. FINDINGS: Menisci chondrocalcinosis. Moderate medial compartmental degenerative articular space narrowing. Bony demineralization. Mild marginal spurring. Spurring at the quadriceps insertion on the patella. Vascular calcifications noted in the popliteal region. No fracture observed. IMPRESSION: 1. Chondrocalcinosis suggesting CPPD arthropathy. 2. Moderate degenerative chondral thinning in the medial compartment causing narrowing. Mild tricompartmental spurring. Electronically Signed   By: Gaylyn Rong M.D.   On: 11/11/2015 14:44   Dg Knee Complete 4 Views Right  11/11/2015  CLINICAL DATA:  Bilateral knee pain.  Fall recently. EXAM: RIGHT KNEE - COMPLETE 4+ VIEW COMPARISON:  09/20/2015 FINDINGS: Meniscal chondrocalcinosis. Moderate tricompartmental articular cartilage thinning with marginal spurring. Bony demineralization. Vascular calcifications. No definite knee effusion. Spurring at the attachments of the quadriceps and patellar tendons to the patella. IMPRESSION: 1. Chondrocalcinosis raising the possibility of CPPD arthropathy. 2. Moderate osteoarthritis. Electronically Signed   By: Gaylyn Rong M.D.   On: 11/11/2015 14:45   Dg Hips Bilat With Pelvis 3-4 Views  11/11/2015  CLINICAL DATA:  Left greater than right hip and knee pain.  Fall. EXAM: DG HIP (WITH OR WITHOUT PELVIS) 3-4V BILAT COMPARISON:  01/07/2015 and 09/20/2015 FINDINGS: Lower lumbar spondylosis and degenerative disc disease, postoperative findings at L4-5. Bony demineralization. Right hip hemiarthroplasty. Moderate to prominent degenerative arthropathy left hip with extensive  spurring but only moderate loss of articular space. Arcuate lines of the sacrum intact.  I do not see a pelvic fracture. No left hip fracture. I do not see a fracture or complicating feature along the right hip hemiarthroplasty. IMPRESSION: 1. No acute findings. 2. Moderate to prominent degenerative arthropathy of the left hip. Right hip hemiarthroplasty. 3. Lower lumbar spondylosis and degenerative disc disease, with postoperative findings in the mid to lower lumbar spine Electronically Signed  By: Gaylyn RongWalter  Liebkemann M.D.   On: 11/11/2015 14:43   I have personally reviewed and evaluated these images and lab results as part of my medical decision-making.   EKG Interpretation None      MDM   Final diagnoses:  Fall from standing, initial encounter  Arthralgia  UTI (lower urinary tract infection)     No acute injury. Patient does have evidence of UTI. Will start antibiotics. Advised follow-up with her primary physician. Return precautions given.   Loren Raceravid Kemoni Ortega, MD 11/11/15 [phone]

## 2015-11-11 NOTE — ED Notes (Signed)
Per EMS[facility] witnessed [facility] today from standing. C/o left hip pain. Hx right hip fracture. Hx dementia. Staff denies LOC and head injury.

## 2015-11-11 NOTE — ED Notes (Signed)
Bed: WA06 Expected date:  Expected time:  Means of arrival:  Comments: 82 yo fall

## 2015-11-11 NOTE — Discharge Instructions (Signed)
Joint Pain °Joint pain, which is also called arthralgia, can be caused by many things. Joint pain often goes away when you follow your health care provider's instructions for relieving pain at home. However, joint pain can also be caused by conditions that require further treatment. Common causes of joint pain include: °· Bruising in the area of the joint. °· Overuse of the joint. °· Wear and tear on the joints that occur with aging (osteoarthritis). °· Various other forms of arthritis. °· A buildup of a crystal form of uric acid in the joint (gout). °· Infections of the joint (septic arthritis) or of the bone (osteomyelitis). °Your health care provider may recommend medicine to help with the pain. If your joint pain continues, additional tests may be needed to diagnose your condition. °HOME CARE INSTRUCTIONS °Watch your condition for any changes. Follow these instructions as directed to lessen the pain that you are feeling. °· Take medicines only as directed by your health care provider. °· Rest the affected area for as long as your health care provider says that you should. If directed to do so, raise the painful joint above the level of your heart while you are sitting or lying down. °· Do not do things that cause or worsen pain. °· If directed, apply ice to the painful area: °· Put ice in a plastic bag. °· Place a towel between your skin and the bag. °· Leave the ice on for 20 minutes, 2-3 times per day. °· Wear an elastic bandage, splint, or sling as directed by your health care provider. Loosen the elastic bandage or splint if your fingers or toes become numb and tingle, or if they turn cold and blue. °· Begin exercising or stretching the affected area as directed by your health care provider. Ask your health care provider what types of exercise are safe for you. °· Keep all follow-up visits as directed by your health care provider. This is important. °SEEK MEDICAL CARE IF: °· Your pain increases, and medicine  does not help. °· Your joint pain does not improve within 3 days. °· You have increased bruising or swelling. °· You have a fever. °· You lose 10 lb (4.5 kg) or more without trying. °SEEK IMMEDIATE MEDICAL CARE IF: °· You are not able to move the joint. °· Your fingers or toes become numb or they turn cold and blue. °  °This information is not intended to replace advice given to you by your health care provider. Make sure you discuss any questions you have with your health care provider. °  °Document Released: 05/19/2005 Document Revised: 06/09/2014 Document Reviewed: 02/28/2014 °Elsevier Interactive Patient Education ©2016 [employer] °Urinary Tract Infection °Urinary tract infections (UTIs) can develop anywhere along your urinary tract. Your urinary tract is your body's drainage system for removing wastes and extra water. Your urinary tract includes two kidneys, two ureters, a bladder, and a urethra. Your kidneys are a pair of bean-shaped organs. Each kidney is about the size of your fist. They are located below your ribs, one on each side of your spine. °CAUSES °Infections are caused by microbes, which are microscopic organisms, including fungi, viruses, and bacteria. These organisms are so small that they can only be seen through a microscope. Bacteria are the microbes that most commonly cause UTIs. °SYMPTOMS  °Symptoms of UTIs may vary by age and gender of the patient and by the location of the infection. Symptoms in young women typically include a frequent and intense urge to   urinate and a painful, burning feeling in the bladder or urethra during urination. Older women and men are more likely to be tired, shaky, and weak and have muscle aches and abdominal pain. A fever may mean the infection is in your kidneys. Other symptoms of a kidney infection include pain in your back or sides below the ribs, nausea, and vomiting. °DIAGNOSIS °To diagnose a UTI, your caregiver will ask you about your symptoms. Your  caregiver will also ask you to provide a urine sample. The urine sample will be tested for bacteria and white blood cells. White blood cells are made by your body to help fight infection. °TREATMENT  °Typically, UTIs can be treated with medication. Because most UTIs are caused by a bacterial infection, they usually can be treated with the use of antibiotics. The choice of antibiotic and length of treatment depend on your symptoms and the type of bacteria causing your infection. °HOME CARE INSTRUCTIONS °· If you were prescribed antibiotics, take them exactly as your caregiver instructs you. Finish the medication even if you feel better after you have only taken some of the medication. °· Drink enough water and fluids to keep your urine clear or pale yellow. °· Avoid caffeine, tea, and carbonated beverages. They tend to irritate your bladder. °· Empty your bladder often. Avoid holding urine for long periods of time. °· Empty your bladder before and after sexual intercourse. °· After a bowel movement, women should cleanse from front to back. Use each tissue only once. °SEEK MEDICAL CARE IF:  °· You have back pain. °· You develop a fever. °· Your symptoms do not begin to resolve within 3 days. °SEEK IMMEDIATE MEDICAL CARE IF:  °· You have severe back pain or lower abdominal pain. °· You develop chills. °· You have nausea or vomiting. °· You have continued burning or discomfort with urination. °MAKE SURE YOU:  °· Understand these instructions. °· Will watch your condition. °· Will get help right away if you are not doing well or get worse. °  °This information is not intended to replace advice given to you by your health care provider. Make sure you discuss any questions you have with your health care provider. °  °Document Released: 02/26/2005 Document Revised: 02/07/2015 Document Reviewed: 06/27/2011 °Elsevier Interactive Patient Education ©2016 [employer] ° °

## 2015-11-12 DIAGNOSIS — R296 Repeated falls: Secondary | ICD-10-CM | POA: Diagnosis not present

## 2015-11-12 DIAGNOSIS — F0151 Vascular dementia with behavioral disturbance: Secondary | ICD-10-CM | POA: Diagnosis not present

## 2015-11-12 DIAGNOSIS — G0430 Acute necrotizing hemorrhagic encephalopathy, unspecified: Secondary | ICD-10-CM | POA: Diagnosis not present

## 2015-11-12 DIAGNOSIS — W010XXA Fall on same level from slipping, tripping and stumbling without subsequent striking against object, initial encounter: Secondary | ICD-10-CM | POA: Diagnosis not present

## 2015-11-13 DIAGNOSIS — G0430 Acute necrotizing hemorrhagic encephalopathy, unspecified: Secondary | ICD-10-CM | POA: Diagnosis not present

## 2015-11-14 DIAGNOSIS — G0430 Acute necrotizing hemorrhagic encephalopathy, unspecified: Secondary | ICD-10-CM | POA: Diagnosis not present

## 2015-11-15 DIAGNOSIS — G0430 Acute necrotizing hemorrhagic encephalopathy, unspecified: Secondary | ICD-10-CM | POA: Diagnosis not present

## 2015-11-16 DIAGNOSIS — G0430 Acute necrotizing hemorrhagic encephalopathy, unspecified: Secondary | ICD-10-CM | POA: Diagnosis not present

## 2015-11-17 DIAGNOSIS — G0430 Acute necrotizing hemorrhagic encephalopathy, unspecified: Secondary | ICD-10-CM | POA: Diagnosis not present

## 2015-11-18 DIAGNOSIS — G0430 Acute necrotizing hemorrhagic encephalopathy, unspecified: Secondary | ICD-10-CM | POA: Diagnosis not present

## 2015-11-19 DIAGNOSIS — G0430 Acute necrotizing hemorrhagic encephalopathy, unspecified: Secondary | ICD-10-CM | POA: Diagnosis not present

## 2015-11-20 DIAGNOSIS — G309 Alzheimer's disease, unspecified: Secondary | ICD-10-CM | POA: Diagnosis not present

## 2015-11-20 DIAGNOSIS — G0430 Acute necrotizing hemorrhagic encephalopathy, unspecified: Secondary | ICD-10-CM | POA: Diagnosis not present

## 2015-11-20 DIAGNOSIS — F0281 Dementia in other diseases classified elsewhere with behavioral disturbance: Secondary | ICD-10-CM | POA: Diagnosis not present

## 2015-11-20 DIAGNOSIS — F411 Generalized anxiety disorder: Secondary | ICD-10-CM | POA: Diagnosis not present

## 2015-11-21 DIAGNOSIS — G0430 Acute necrotizing hemorrhagic encephalopathy, unspecified: Secondary | ICD-10-CM | POA: Diagnosis not present

## 2015-11-22 DIAGNOSIS — G8191 Hemiplegia, unspecified affecting right dominant side: Secondary | ICD-10-CM | POA: Diagnosis not present

## 2015-11-22 DIAGNOSIS — I129 Hypertensive chronic kidney disease with stage 1 through stage 4 chronic kidney disease, or unspecified chronic kidney disease: Secondary | ICD-10-CM | POA: Diagnosis not present

## 2015-11-22 DIAGNOSIS — F0280 Dementia in other diseases classified elsewhere without behavioral disturbance: Secondary | ICD-10-CM | POA: Diagnosis not present

## 2015-11-22 DIAGNOSIS — G309 Alzheimer's disease, unspecified: Secondary | ICD-10-CM | POA: Diagnosis not present

## 2015-11-22 DIAGNOSIS — G0430 Acute necrotizing hemorrhagic encephalopathy, unspecified: Secondary | ICD-10-CM | POA: Diagnosis not present

## 2015-11-22 DIAGNOSIS — S7291XD Unspecified fracture of right femur, subsequent encounter for closed fracture with routine healing: Secondary | ICD-10-CM | POA: Diagnosis not present

## 2015-11-22 DIAGNOSIS — M150 Primary generalized (osteo)arthritis: Secondary | ICD-10-CM | POA: Diagnosis not present

## 2015-11-23 DIAGNOSIS — G0430 Acute necrotizing hemorrhagic encephalopathy, unspecified: Secondary | ICD-10-CM | POA: Diagnosis not present

## 2015-11-24 DIAGNOSIS — G0430 Acute necrotizing hemorrhagic encephalopathy, unspecified: Secondary | ICD-10-CM | POA: Diagnosis not present

## 2015-11-25 DIAGNOSIS — G0430 Acute necrotizing hemorrhagic encephalopathy, unspecified: Secondary | ICD-10-CM | POA: Diagnosis not present

## 2015-11-26 DIAGNOSIS — F0280 Dementia in other diseases classified elsewhere without behavioral disturbance: Secondary | ICD-10-CM | POA: Diagnosis not present

## 2015-11-26 DIAGNOSIS — M150 Primary generalized (osteo)arthritis: Secondary | ICD-10-CM | POA: Diagnosis not present

## 2015-11-26 DIAGNOSIS — G309 Alzheimer's disease, unspecified: Secondary | ICD-10-CM | POA: Diagnosis not present

## 2015-11-26 DIAGNOSIS — S7291XD Unspecified fracture of right femur, subsequent encounter for closed fracture with routine healing: Secondary | ICD-10-CM | POA: Diagnosis not present

## 2015-11-26 DIAGNOSIS — I129 Hypertensive chronic kidney disease with stage 1 through stage 4 chronic kidney disease, or unspecified chronic kidney disease: Secondary | ICD-10-CM | POA: Diagnosis not present

## 2015-11-26 DIAGNOSIS — G0430 Acute necrotizing hemorrhagic encephalopathy, unspecified: Secondary | ICD-10-CM | POA: Diagnosis not present

## 2015-11-26 DIAGNOSIS — G8191 Hemiplegia, unspecified affecting right dominant side: Secondary | ICD-10-CM | POA: Diagnosis not present

## 2015-11-27 DIAGNOSIS — G0430 Acute necrotizing hemorrhagic encephalopathy, unspecified: Secondary | ICD-10-CM | POA: Diagnosis not present

## 2015-11-28 DIAGNOSIS — G0430 Acute necrotizing hemorrhagic encephalopathy, unspecified: Secondary | ICD-10-CM | POA: Diagnosis not present

## 2015-11-28 DIAGNOSIS — I129 Hypertensive chronic kidney disease with stage 1 through stage 4 chronic kidney disease, or unspecified chronic kidney disease: Secondary | ICD-10-CM | POA: Diagnosis not present

## 2015-11-28 DIAGNOSIS — S7291XD Unspecified fracture of right femur, subsequent encounter for closed fracture with routine healing: Secondary | ICD-10-CM | POA: Diagnosis not present

## 2015-11-28 DIAGNOSIS — G309 Alzheimer's disease, unspecified: Secondary | ICD-10-CM | POA: Diagnosis not present

## 2015-11-28 DIAGNOSIS — F0280 Dementia in other diseases classified elsewhere without behavioral disturbance: Secondary | ICD-10-CM | POA: Diagnosis not present

## 2015-11-28 DIAGNOSIS — G8191 Hemiplegia, unspecified affecting right dominant side: Secondary | ICD-10-CM | POA: Diagnosis not present

## 2015-11-28 DIAGNOSIS — M150 Primary generalized (osteo)arthritis: Secondary | ICD-10-CM | POA: Diagnosis not present

## 2015-11-29 DIAGNOSIS — G0430 Acute necrotizing hemorrhagic encephalopathy, unspecified: Secondary | ICD-10-CM | POA: Diagnosis not present

## 2015-11-30 DIAGNOSIS — G0430 Acute necrotizing hemorrhagic encephalopathy, unspecified: Secondary | ICD-10-CM | POA: Diagnosis not present

## 2015-12-01 DIAGNOSIS — G0430 Acute necrotizing hemorrhagic encephalopathy, unspecified: Secondary | ICD-10-CM | POA: Diagnosis not present

## 2015-12-02 DIAGNOSIS — G0430 Acute necrotizing hemorrhagic encephalopathy, unspecified: Secondary | ICD-10-CM | POA: Diagnosis not present

## 2015-12-03 DIAGNOSIS — G309 Alzheimer's disease, unspecified: Secondary | ICD-10-CM | POA: Diagnosis not present

## 2015-12-03 DIAGNOSIS — M150 Primary generalized (osteo)arthritis: Secondary | ICD-10-CM | POA: Diagnosis not present

## 2015-12-03 DIAGNOSIS — G8191 Hemiplegia, unspecified affecting right dominant side: Secondary | ICD-10-CM | POA: Diagnosis not present

## 2015-12-03 DIAGNOSIS — S7291XD Unspecified fracture of right femur, subsequent encounter for closed fracture with routine healing: Secondary | ICD-10-CM | POA: Diagnosis not present

## 2015-12-03 DIAGNOSIS — F0280 Dementia in other diseases classified elsewhere without behavioral disturbance: Secondary | ICD-10-CM | POA: Diagnosis not present

## 2015-12-03 DIAGNOSIS — I129 Hypertensive chronic kidney disease with stage 1 through stage 4 chronic kidney disease, or unspecified chronic kidney disease: Secondary | ICD-10-CM | POA: Diagnosis not present

## 2015-12-03 DIAGNOSIS — G0430 Acute necrotizing hemorrhagic encephalopathy, unspecified: Secondary | ICD-10-CM | POA: Diagnosis not present

## 2015-12-04 DIAGNOSIS — G0430 Acute necrotizing hemorrhagic encephalopathy, unspecified: Secondary | ICD-10-CM | POA: Diagnosis not present

## 2015-12-05 DIAGNOSIS — I129 Hypertensive chronic kidney disease with stage 1 through stage 4 chronic kidney disease, or unspecified chronic kidney disease: Secondary | ICD-10-CM | POA: Diagnosis not present

## 2015-12-05 DIAGNOSIS — F0280 Dementia in other diseases classified elsewhere without behavioral disturbance: Secondary | ICD-10-CM | POA: Diagnosis not present

## 2015-12-05 DIAGNOSIS — S7291XD Unspecified fracture of right femur, subsequent encounter for closed fracture with routine healing: Secondary | ICD-10-CM | POA: Diagnosis not present

## 2015-12-05 DIAGNOSIS — M150 Primary generalized (osteo)arthritis: Secondary | ICD-10-CM | POA: Diagnosis not present

## 2015-12-05 DIAGNOSIS — G309 Alzheimer's disease, unspecified: Secondary | ICD-10-CM | POA: Diagnosis not present

## 2015-12-05 DIAGNOSIS — G0430 Acute necrotizing hemorrhagic encephalopathy, unspecified: Secondary | ICD-10-CM | POA: Diagnosis not present

## 2015-12-05 DIAGNOSIS — G8191 Hemiplegia, unspecified affecting right dominant side: Secondary | ICD-10-CM | POA: Diagnosis not present

## 2015-12-06 DIAGNOSIS — G0430 Acute necrotizing hemorrhagic encephalopathy, unspecified: Secondary | ICD-10-CM | POA: Diagnosis not present

## 2015-12-07 DIAGNOSIS — G0430 Acute necrotizing hemorrhagic encephalopathy, unspecified: Secondary | ICD-10-CM | POA: Diagnosis not present

## 2015-12-08 DIAGNOSIS — G0430 Acute necrotizing hemorrhagic encephalopathy, unspecified: Secondary | ICD-10-CM | POA: Diagnosis not present

## 2015-12-09 DIAGNOSIS — G0430 Acute necrotizing hemorrhagic encephalopathy, unspecified: Secondary | ICD-10-CM | POA: Diagnosis not present

## 2015-12-10 DIAGNOSIS — I129 Hypertensive chronic kidney disease with stage 1 through stage 4 chronic kidney disease, or unspecified chronic kidney disease: Secondary | ICD-10-CM | POA: Diagnosis not present

## 2015-12-10 DIAGNOSIS — G309 Alzheimer's disease, unspecified: Secondary | ICD-10-CM | POA: Diagnosis not present

## 2015-12-10 DIAGNOSIS — G0430 Acute necrotizing hemorrhagic encephalopathy, unspecified: Secondary | ICD-10-CM | POA: Diagnosis not present

## 2015-12-10 DIAGNOSIS — R32 Unspecified urinary incontinence: Secondary | ICD-10-CM | POA: Diagnosis not present

## 2015-12-10 DIAGNOSIS — F0280 Dementia in other diseases classified elsewhere without behavioral disturbance: Secondary | ICD-10-CM | POA: Diagnosis not present

## 2015-12-10 DIAGNOSIS — S7291XD Unspecified fracture of right femur, subsequent encounter for closed fracture with routine healing: Secondary | ICD-10-CM | POA: Diagnosis not present

## 2015-12-10 DIAGNOSIS — M150 Primary generalized (osteo)arthritis: Secondary | ICD-10-CM | POA: Diagnosis not present

## 2015-12-10 DIAGNOSIS — G8191 Hemiplegia, unspecified affecting right dominant side: Secondary | ICD-10-CM | POA: Diagnosis not present

## 2015-12-11 DIAGNOSIS — G0430 Acute necrotizing hemorrhagic encephalopathy, unspecified: Secondary | ICD-10-CM | POA: Diagnosis not present

## 2015-12-12 DIAGNOSIS — G0430 Acute necrotizing hemorrhagic encephalopathy, unspecified: Secondary | ICD-10-CM | POA: Diagnosis not present

## 2015-12-13 DIAGNOSIS — G0430 Acute necrotizing hemorrhagic encephalopathy, unspecified: Secondary | ICD-10-CM | POA: Diagnosis not present

## 2015-12-14 DIAGNOSIS — G0430 Acute necrotizing hemorrhagic encephalopathy, unspecified: Secondary | ICD-10-CM | POA: Diagnosis not present

## 2015-12-15 DIAGNOSIS — G0430 Acute necrotizing hemorrhagic encephalopathy, unspecified: Secondary | ICD-10-CM | POA: Diagnosis not present

## 2015-12-16 DIAGNOSIS — G0430 Acute necrotizing hemorrhagic encephalopathy, unspecified: Secondary | ICD-10-CM | POA: Diagnosis not present

## 2015-12-17 DIAGNOSIS — G0430 Acute necrotizing hemorrhagic encephalopathy, unspecified: Secondary | ICD-10-CM | POA: Diagnosis not present

## 2015-12-18 DIAGNOSIS — G0430 Acute necrotizing hemorrhagic encephalopathy, unspecified: Secondary | ICD-10-CM | POA: Diagnosis not present

## 2015-12-19 DIAGNOSIS — G0430 Acute necrotizing hemorrhagic encephalopathy, unspecified: Secondary | ICD-10-CM | POA: Diagnosis not present

## 2015-12-20 DIAGNOSIS — G0430 Acute necrotizing hemorrhagic encephalopathy, unspecified: Secondary | ICD-10-CM | POA: Diagnosis not present

## 2015-12-21 DIAGNOSIS — G0430 Acute necrotizing hemorrhagic encephalopathy, unspecified: Secondary | ICD-10-CM | POA: Diagnosis not present

## 2015-12-22 DIAGNOSIS — G0430 Acute necrotizing hemorrhagic encephalopathy, unspecified: Secondary | ICD-10-CM | POA: Diagnosis not present

## 2015-12-23 DIAGNOSIS — G0430 Acute necrotizing hemorrhagic encephalopathy, unspecified: Secondary | ICD-10-CM | POA: Diagnosis not present

## 2015-12-24 DIAGNOSIS — G0430 Acute necrotizing hemorrhagic encephalopathy, unspecified: Secondary | ICD-10-CM | POA: Diagnosis not present

## 2015-12-25 DIAGNOSIS — G0430 Acute necrotizing hemorrhagic encephalopathy, unspecified: Secondary | ICD-10-CM | POA: Diagnosis not present

## 2015-12-26 DIAGNOSIS — G0430 Acute necrotizing hemorrhagic encephalopathy, unspecified: Secondary | ICD-10-CM | POA: Diagnosis not present

## 2015-12-27 DIAGNOSIS — G0430 Acute necrotizing hemorrhagic encephalopathy, unspecified: Secondary | ICD-10-CM | POA: Diagnosis not present

## 2015-12-28 DIAGNOSIS — G0430 Acute necrotizing hemorrhagic encephalopathy, unspecified: Secondary | ICD-10-CM | POA: Diagnosis not present

## 2015-12-29 DIAGNOSIS — G0430 Acute necrotizing hemorrhagic encephalopathy, unspecified: Secondary | ICD-10-CM | POA: Diagnosis not present

## 2015-12-30 DIAGNOSIS — G0430 Acute necrotizing hemorrhagic encephalopathy, unspecified: Secondary | ICD-10-CM | POA: Diagnosis not present

## 2015-12-31 DIAGNOSIS — G0430 Acute necrotizing hemorrhagic encephalopathy, unspecified: Secondary | ICD-10-CM | POA: Diagnosis not present

## 2016-01-01 DIAGNOSIS — G0430 Acute necrotizing hemorrhagic encephalopathy, unspecified: Secondary | ICD-10-CM | POA: Diagnosis not present

## 2016-01-02 DIAGNOSIS — G0430 Acute necrotizing hemorrhagic encephalopathy, unspecified: Secondary | ICD-10-CM | POA: Diagnosis not present

## 2016-01-03 DIAGNOSIS — G0430 Acute necrotizing hemorrhagic encephalopathy, unspecified: Secondary | ICD-10-CM | POA: Diagnosis not present

## 2016-01-04 DIAGNOSIS — G0430 Acute necrotizing hemorrhagic encephalopathy, unspecified: Secondary | ICD-10-CM | POA: Diagnosis not present

## 2016-01-04 DIAGNOSIS — R32 Unspecified urinary incontinence: Secondary | ICD-10-CM | POA: Diagnosis not present

## 2016-01-05 DIAGNOSIS — G0430 Acute necrotizing hemorrhagic encephalopathy, unspecified: Secondary | ICD-10-CM | POA: Diagnosis not present

## 2016-01-06 DIAGNOSIS — G0430 Acute necrotizing hemorrhagic encephalopathy, unspecified: Secondary | ICD-10-CM | POA: Diagnosis not present

## 2016-01-07 DIAGNOSIS — G0430 Acute necrotizing hemorrhagic encephalopathy, unspecified: Secondary | ICD-10-CM | POA: Diagnosis not present

## 2016-01-08 DIAGNOSIS — G0430 Acute necrotizing hemorrhagic encephalopathy, unspecified: Secondary | ICD-10-CM | POA: Diagnosis not present

## 2016-01-09 ENCOUNTER — Emergency Department (HOSPITAL_COMMUNITY): Payer: Commercial Managed Care - HMO

## 2016-01-09 ENCOUNTER — Encounter (HOSPITAL_COMMUNITY): Payer: Self-pay | Admitting: Family Medicine

## 2016-01-09 ENCOUNTER — Emergency Department (HOSPITAL_COMMUNITY)
Admission: EM | Admit: 2016-01-09 | Discharge: 2016-01-10 | Disposition: A | Discharge status: Home / Self Care | Payer: Commercial Managed Care - HMO | Attending: Emergency Medicine | Admitting: Emergency Medicine

## 2016-01-09 DIAGNOSIS — Z79899 Other long term (current) drug therapy: Secondary | ICD-10-CM | POA: Insufficient documentation

## 2016-01-09 DIAGNOSIS — I129 Hypertensive chronic kidney disease with stage 1 through stage 4 chronic kidney disease, or unspecified chronic kidney disease: Secondary | ICD-10-CM | POA: Diagnosis not present

## 2016-01-09 DIAGNOSIS — G0430 Acute necrotizing hemorrhagic encephalopathy, unspecified: Secondary | ICD-10-CM | POA: Diagnosis not present

## 2016-01-09 DIAGNOSIS — E039 Hypothyroidism, unspecified: Secondary | ICD-10-CM | POA: Insufficient documentation

## 2016-01-09 DIAGNOSIS — Z7982 Long term (current) use of aspirin: Secondary | ICD-10-CM | POA: Diagnosis not present

## 2016-01-09 DIAGNOSIS — N390 Urinary tract infection, site not specified: Secondary | ICD-10-CM

## 2016-01-09 DIAGNOSIS — R5383 Other fatigue: Secondary | ICD-10-CM | POA: Diagnosis present

## 2016-01-09 DIAGNOSIS — J45909 Unspecified asthma, uncomplicated: Secondary | ICD-10-CM | POA: Insufficient documentation

## 2016-01-09 DIAGNOSIS — Z87891 Personal history of nicotine dependence: Secondary | ICD-10-CM | POA: Diagnosis not present

## 2016-01-09 DIAGNOSIS — Z96641 Presence of right artificial hip joint: Secondary | ICD-10-CM | POA: Insufficient documentation

## 2016-01-09 DIAGNOSIS — N189 Chronic kidney disease, unspecified: Secondary | ICD-10-CM | POA: Insufficient documentation

## 2016-01-09 DIAGNOSIS — F4489 Other dissociative and conversion disorders: Secondary | ICD-10-CM | POA: Diagnosis not present

## 2016-01-09 DIAGNOSIS — I6789 Other cerebrovascular disease: Secondary | ICD-10-CM | POA: Diagnosis not present

## 2016-01-09 DIAGNOSIS — R402411 Glasgow coma scale score 13-15, in the field [EMT or ambulance]: Secondary | ICD-10-CM | POA: Diagnosis not present

## 2016-01-09 DIAGNOSIS — R4182 Altered mental status, unspecified: Secondary | ICD-10-CM | POA: Diagnosis not present

## 2016-01-09 LAB — URINE MICROSCOPIC-ADD ON

## 2016-01-09 LAB — CBC WITH DIFFERENTIAL/PLATELET
BASOS ABS: 0 10*3/uL (ref 0.0–0.1)
BASOS PCT: 0 %
EOS ABS: 0.2 10*3/uL (ref 0.0–0.7)
EOS PCT: 2 %
HCT: 36.1 % (ref 36.0–46.0)
Hemoglobin: 11.6 g/dL — ABNORMAL LOW (ref 12.0–15.0)
Lymphocytes Relative: 28 %
Lymphs Abs: 2.1 10*3/uL (ref 0.7–4.0)
MCH: 28.4 pg (ref 26.0–34.0)
MCHC: 32.1 g/dL (ref 30.0–36.0)
MCV: 88.3 fL (ref 78.0–100.0)
MONO ABS: 0.5 10*3/uL (ref 0.1–1.0)
Monocytes Relative: 7 %
Neutro Abs: 4.7 10*3/uL (ref 1.7–7.7)
Neutrophils Relative %: 63 %
PLATELETS: 226 10*3/uL (ref 150–400)
RBC: 4.09 MIL/uL (ref 3.87–5.11)
RDW: 13.1 % (ref 11.5–15.5)
WBC: 7.5 10*3/uL (ref 4.0–10.5)

## 2016-01-09 LAB — URINALYSIS, ROUTINE W REFLEX MICROSCOPIC
Bilirubin Urine: NEGATIVE
GLUCOSE, UA: NEGATIVE mg/dL
KETONES UR: NEGATIVE mg/dL
Nitrite: NEGATIVE
PROTEIN: NEGATIVE mg/dL
Specific Gravity, Urine: 1.019 (ref 1.005–1.030)
pH: 5.5 (ref 5.0–8.0)

## 2016-01-09 LAB — BASIC METABOLIC PANEL
ANION GAP: 7 (ref 5–15)
BUN: 20 mg/dL (ref 6–20)
CALCIUM: 9.1 mg/dL (ref 8.9–10.3)
CO2: 27 mmol/L (ref 22–32)
Chloride: 104 mmol/L (ref 101–111)
Creatinine, Ser: 0.78 mg/dL (ref 0.44–1.00)
GLUCOSE: 92 mg/dL (ref 65–99)
Potassium: 4.3 mmol/L (ref 3.5–5.1)
SODIUM: 138 mmol/L (ref 135–145)

## 2016-01-09 LAB — TROPONIN I: Troponin I: <0.03 ng/mL (ref <0.03)

## 2016-01-09 MED ORDER — CEPHALEXIN 500 MG PO CAPS: 500.0000 mg | ORAL_CAPSULE | Freq: Three times a day (TID) | ORAL | 0 refills | Status: DC

## 2016-01-09 MED ORDER — CEPHALEXIN 250 MG PO CAPS
500.0000 mg | ORAL_CAPSULE | Freq: Once | ORAL | Status: AC
  Administered 2016-01-09: 500 mg via ORAL
  Filled 2016-01-09: qty 2

## 2016-01-09 MED ORDER — SODIUM CHLORIDE 0.9 % IV BOLUS (SEPSIS)
500.0000 mL | Freq: Once | INTRAVENOUS | Status: AC
  Administered 2016-01-09: 500 mL via INTRAVENOUS

## 2016-01-09 NOTE — Discharge Instructions (Signed)
Mrs. [name] has a urinary tract infection. Prescription for antibiotic 3 times a day. Increase fluids.

## 2016-01-09 NOTE — ED Notes (Signed)
Portable Chest xray at bedside at this time.

## 2016-01-09 NOTE — ED Notes (Signed)
Patient transported to CT 

## 2016-01-09 NOTE — ED Triage Notes (Signed)
Pt [facility] with staff concerns for pain and "decreased activity".  Reports patient was not up and as alert as she normally is around 1500, and put her hand on her chest and said her chest hurt at 1800.  Upon EMS arrival, staff stated that patient is back to baseline, when asked in anything hurt, she said yes, when asked where she was hurting, she couldn't specify.  She now states she is in no pain but feels very tired/fatigued.

## 2016-01-09 NOTE — ED Provider Notes (Signed)
[facility]ENCY DEPT Provider Note   CSN: [id number] Arrival date & time: 01/09/16  1850  First Provider Contact:  First MD Initiated Contact with Patient 01/09/16 1906        History   Chief Complaint Chief Complaint  Patient presents with  . Altered Mental Status  . Fatigue    HPI [name] is a 82 y.o. female.  Level V caveat for dementia. History obtained from family. They say "she is just not herself". She is normally talkative, but is more quiet today. No fever, sweats, chills, chest pain, dyspnea. No gross neurological deficits. She resides in a memory care unit.      Past Medical History:  Diagnosis Date  . Acute bronchitis 04/10/2013  . Anemia   . Angina   . Anxiety   . Arthritis   . Arthritis 10/02/2012  . Asthma   . Chicken pox   . Chronic kidney disease   . Colon polyps   . Dehydration 08/31/2013  . Dementia   . Depression   . Dysrhythmia   . Headache(784.0)   . Heart murmur   . Hypertension   . Hypothyroidism   . Left leg pain 10/02/2012  . Pneumonia   . Recurrent upper respiratory infection (URI)   . Shortness of breath   . UTI (urinary tract infection) 12/30/2012  . Valvular heart disease 09/04/2012    Patient Active Problem List   Diagnosis Date Noted  . Pressure ulcer 09/20/2015  . Displaced fracture of right femoral neck (HCC) 09/19/2015  . Altered mental status   . Confusion 06/26/2015  . Fall   . Aspiration pneumonia (HCC) 01/10/2015  . Acute encephalopathy 01/08/2015  . UTI (urinary tract infection) 01/08/2015  . 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 Recurrent falls 11/27/2014  . FTT (failure to thrive) in adult 10/26/2014  . Dehydration 10/26/2014  . Abnormal urinalysis 10/26/2014  . Normocytic anemia 10/26/2014  . Lump of skin of left upper extremity 09/07/2014  . Rash and nonspecific skin eruption 05/08/2014  . Syncope and collapse 11/30/2013  . Low back pain, episodic 11/20/2013  . Chronic kidney disease    . Insomnia 08/31/2013  . Arthritis 10/02/2012  . Valvular heart disease 09/04/2012  . Restless leg syndrome 06/06/2012  . Trochanteric bursitis 08/02/2011  . Dementia 08/02/2011  . Leg pain 03/28/2011  . Hypertension 09/22/2010  . Hypothyroidism 09/22/2010    Past Surgical History:  Procedure Laterality Date  . ABDOMINAL HYSTERECTOMY    . APPENDECTOMY    . BACK SURGERY    . CHOLECYSTECTOMY    . COLONOSCOPY W/ POLYPECTOMY    . EYE SURGERY     cataract removed and eye lids lifted  . feet surgery    . FRACTURE SURGERY     bilateral arms  . HAND SURGERY    . HIP ARTHROPLASTY Right 09/20/2015   Procedure: ARTHROPLASTY RIGHT  BIPOLAR ANTERIOR HIP (HEMIARTHROPLASTY);  Surgeon: Samson Frederic, MD;  Location: [facility];  Service: Orthopedics;  Laterality: Right;  . kidney stones    . SHOULDER ARTHROSCOPY    . TONSILLECTOMY    . TONSILLECTOMY    . TUBAL LIGATION      OB History    No data available       Home Medications    Prior to Admission medications   Medication Sig Start Date End Date Taking? Authorizing Provider  alum & mag hydroxide-simeth (MINTOX) 200-200-20 MG/5ML suspension Take 30 mLs by mouth 4 (four) times  daily as needed for indigestion or heartburn.    Yes Historical Provider, MD  aspirin 81 MG chewable tablet Chew 81 mg by mouth daily.   Yes Historical Provider, MD  divalproex (DEPAKOTE SPRINKLE) 125 MG capsule Take 125 mg by mouth every morning.   Yes Historical Provider, MD  escitalopram (LEXAPRO) 10 MG tablet Take 10 mg by mouth daily.   Yes Historical Provider, MD  guaifenesin (ROBITUSSIN) 100 MG/5ML syrup Take 200 mg by mouth 2 (two) times daily as needed for cough.   Yes Historical Provider, MD  levothyroxine (SYNTHROID, LEVOTHROID) 50 MCG tablet TAKE 1 TABLET (50 MCG TOTAL) BY MOUTH DAILY. 09/25/14  Yes Myrlene Broker, MD  magnesium hydroxide (MILK OF MAGNESIA) 400 MG/5ML suspension Take 30 mLs by mouth at bedtime as needed for mild constipation.    Yes Historical Provider, MD  Multiple Vitamins-Minerals (MULTIVITAMIN WITH MINERALS) tablet Take 1 tablet by mouth daily.   Yes Historical Provider, MD  neomycin-bacitracin-polymyxin (NEOSPORIN) ointment Apply 1 application topically daily as needed for wound care.    Yes Historical Provider, MD  PRESCRIPTION MEDICATION Take 1 each by mouth 3 (three) times daily. Mighty Shakes   Yes Historical Provider, MD  rivastigmine (EXELON) 3 MG capsule Take 3 mg by mouth 2 (two) times daily.   Yes Historical Provider, MD  senna-docusate (SENEXON-S) 8.6-50 MG tablet Take 2 tablets by mouth 2 (two) times daily.    Yes Historical Provider, MD  traMADol (ULTRAM) 50 MG tablet Take 1 tablet (50 mg total) by mouth every 6 (six) hours as needed for severe pain. 09/23/15  Yes Elease Etienne, MD  traZODone (DESYREL) 50 MG tablet Take 50 mg by mouth at bedtime.    Yes Historical Provider, MD  acetaminophen (TYLENOL) 500 MG tablet Take 500 mg by mouth every 4 (four) hours as needed for mild pain, moderate pain or fever.    Historical Provider, MD  aspirin EC 325 MG EC tablet Take 1 tablet (325 mg total) by mouth 2 (two) times daily after a meal. To be taken for 30 days postop (surgery date: 09/20/15). After the 30 days period, change to prior dose of 81 mg daily. Patient not taking: Reported on 11/11/2015 09/23/15   Elease Etienne, MD  cephALEXin (KEFLEX) 500 MG capsule Take 1 capsule (500 mg total) by mouth 3 (three) times daily. 01/09/16   Donnetta Hutching, MD  clonazePAM (KLONOPIN) 0.5 MG tablet Take 1 tablet (0.5 mg total) by mouth 2 (two) times daily. Patient not taking: Reported on 01/09/2016 09/23/15   Elease Etienne, MD  loperamide (IMODIUM) 2 MG capsule Take 2 mg by mouth as needed for diarrhea or loose stools (do not exceed 8 doses in 24 hours.).     Historical Provider, MD    Family History Family History  Problem Relation Age of Onset  . Heart disease Mother   . Heart disease Father   . Heart disease Other   .  Birth defects Other     Social History Social History  Substance Use Topics  . Smoking status: Former Smoker    Packs/day: 0.20    Years: 1.00    Types: Cigarettes    Quit date: 06/02/1941  . Smokeless tobacco: Never Used  . Alcohol use No     Allergies   Morphine and related; Codeine; and Hydrocodone   Review of Systems Review of Systems  Reason unable to perform ROS: Dementia.     Physical Exam Updated Vital Signs BP  150/66   Pulse 68   Temp 98.2 F (36.8 C) (Oral)   Resp 12   SpO2 97%   Physical Exam  Constitutional: No distress.  Blank facial appearance  HENT:  Head: Normocephalic and atraumatic.  Eyes: Conjunctivae are normal.  Neck: Neck supple.  Cardiovascular: Normal rate and regular rhythm.   No murmur heard. Pulmonary/Chest: Effort normal and breath sounds normal. No respiratory distress.  Abdominal: Soft. There is no tenderness.  Musculoskeletal: She exhibits no edema.  Neurological:  Does not appear in pain. Moving her extremities.  Skin: Skin is warm and dry.  Psychiatric:  Flat affect.  Nursing note and vitals reviewed.    ED Treatments / Results  Labs (all labs ordered are listed, but only abnormal results are displayed) Labs Reviewed  CBC WITH DIFFERENTIAL/PLATELET - Abnormal; Notable for the following:       Result Value   Hemoglobin 11.6 (*)    All other components within normal limits  URINALYSIS, ROUTINE W REFLEX MICROSCOPIC (NOT [facility]) - Abnormal; Notable for the following:    Hgb urine dipstick SMALL (*)    Leukocytes, UA MODERATE (*)    All other components within normal limits  URINE MICROSCOPIC-ADD ON - Abnormal; Notable for the following:    Squamous Epithelial / LPF 0-5 (*)    Bacteria, UA RARE (*)    All other components within normal limits  BASIC METABOLIC PANEL  TROPONIN I    EKG  EKG Interpretation  Date/Time:  Wednesday January 09 2016 18:51:02 EDT Ventricular Rate:  66 PR Interval:    QRS  Duration: 158 QT Interval:  444 QTC Calculation: 466 R Axis:   -77 Text Interpretation:  Sinus rhythm Prolonged PR interval RBBB and LAFB Confirmed by Adriana SimasOOK  MD, Kei Langhorst ([id number]) on 01/09/2016 9:28:03 PM       Radiology Ct Head Wo Contrast  Result Date: 01/09/2016 CLINICAL DATA:  Initial evaluation for acute altered mental status. EXAM: CT HEAD WITHOUT CONTRAST TECHNIQUE: Contiguous axial images were obtained from the base of the skull through the vertex without intravenous contrast. COMPARISON:  Prior CT from 06/26/2015. FINDINGS: Scalp soft tissues within normal limits. No acute abnormality about the globes and orbits. Patient is status post lens extraction bilaterally. Small fluid level noted within the left sphenoid sinus. Paranasal sinuses are otherwise clear. No mastoid effusion. Calvarium intact. Generalized cerebral atrophy with chronic small vessel ischemic disease, stable. Scattered vascular calcifications within the carotid siphons. No evidence for acute intracranial hemorrhage. No acute large vessel territory infarct. No mass lesion, midline shift or mass effect. No hydrocephalus. No extra-axial fluid collection. IMPRESSION: 1. No acute intracranial process. 2. Stable cerebral atrophy with mild chronic small vessel ischemic disease. Electronically Signed   By: Rise MuBenjamin  McClintock M.D.   On: 01/09/2016 21:06   Dg Chest Port 1 View  Result Date: 01/09/2016 CLINICAL DATA:  Altered mental status EXAM: PORTABLE CHEST 1 VIEW COMPARISON:  09/20/2015 FINDINGS: Cardiac shadow is mildly enlarged but stable. The lungs are clear bilaterally. No acute bony abnormality is seen. IMPRESSION: No active disease. Electronically Signed   By: Alcide CleverMark  Lukens M.D.   On: 01/09/2016 20:20    Procedures Procedures (including critical care time)  Medications Ordered in ED Medications  sodium chloride 0.9 % bolus 500 mL (0 mLs Intravenous Stopped 01/09/16 2240)  cephALEXin (KEFLEX) capsule 500 mg (500 mg Oral Given  01/09/16 2311)     Initial Impression / Assessment and Plan / ED Course  I have  reviewed the triage vital signs and the nursing notes.  Pertinent labs & imaging results that were available during my care of the patient were reviewed by me and considered in my medical decision making (see chart for details).  Clinical Course    CT head and chest x-ray showed no acute findings. Urinalysis shows evidence of infection. Will start Keflex and culture urine. Discussed with the patient's family. They agree with treatment plan.  Final Clinical Impressions(s) / ED Diagnoses   Final diagnoses:  UTI (lower urinary tract infection)    New Prescriptions New Prescriptions   CEPHALEXIN (KEFLEX) 500 MG CAPSULE    Take 1 capsule (500 mg total) by mouth 3 (three) times daily.     Donnetta Hutching, MD 01/09/16 2340

## 2016-01-10 DIAGNOSIS — R4182 Altered mental status, unspecified: Secondary | ICD-10-CM | POA: Diagnosis not present

## 2016-01-10 DIAGNOSIS — G0430 Acute necrotizing hemorrhagic encephalopathy, unspecified: Secondary | ICD-10-CM | POA: Diagnosis not present

## 2016-01-10 DIAGNOSIS — N390 Urinary tract infection, site not specified: Secondary | ICD-10-CM | POA: Diagnosis not present

## 2016-01-10 NOTE — ED Notes (Signed)
Pt departed in NAD, in care of [facility] 

## 2016-01-11 DIAGNOSIS — G0430 Acute necrotizing hemorrhagic encephalopathy, unspecified: Secondary | ICD-10-CM | POA: Diagnosis not present

## 2016-01-11 IMAGING — MR MR HEAD W/O CM
9 of 12 series · 33 of 48 positions shown · non-contrast
Comparison: CT head 07/08/2014.  MRI 10/03/2013

CLINICAL DATA: Dizziness and blurred vision and headache

EXAM:
MRI HEAD WITHOUT CONTRAST
TECHNIQUE: Multiplanar, multiecho pulse sequences of the brain and surrounding
structures were obtained without intravenous contrast.

[Series 2: FLAIR · sagittal · 5.0mm · 0.47mm/px · 1 of 23 slices shown (1 of 2)]
[im 1/23]
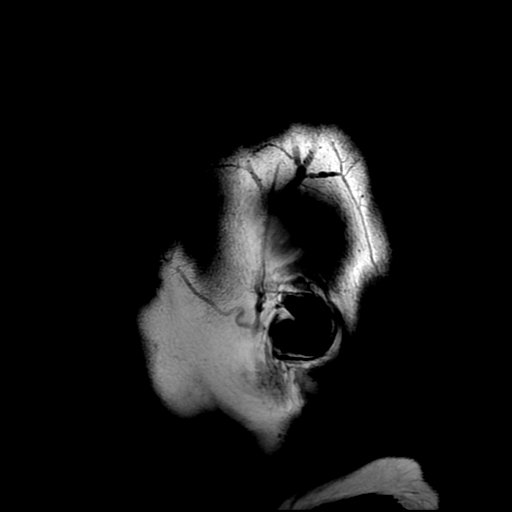

[Series 4: DWI · axial · 3.0mm · 0.94mm/px · z∈[-67,+70]mm · 8 of 94 slices shown (1 of 4)]
[im 1/94]
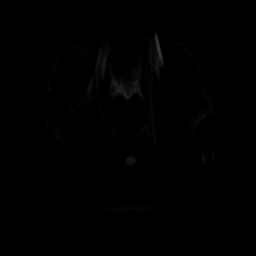
[im 14/94]
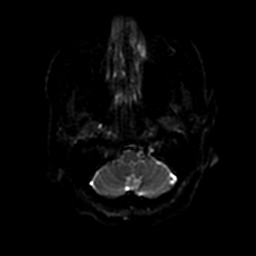
[im 27/94]
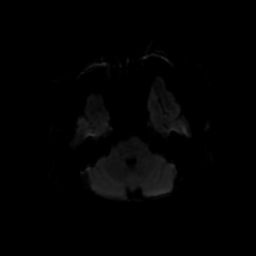
[im 40/94]
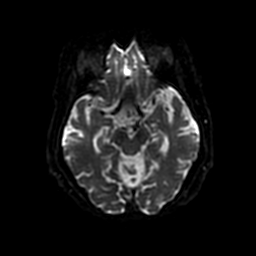
[im 54/94]
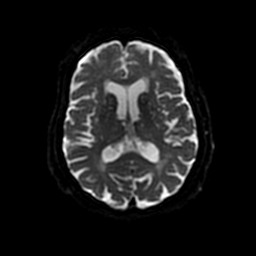
[im 67/94]
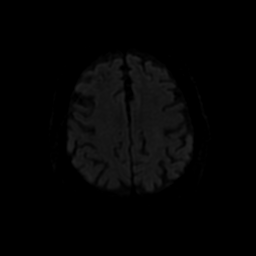
[im 80/94]
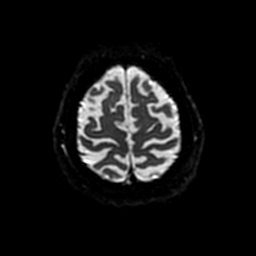
[im 94/94]
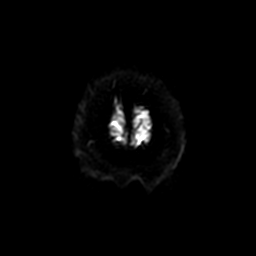

[Series 5: DWI · coronal · 5.0mm · 0.94mm/px · 5 of 64 slices shown (2 of 4)]
[im 1/64]
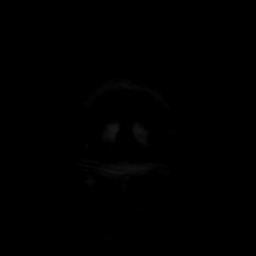
[im 16/64]
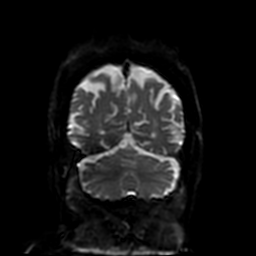
[im 32/64]
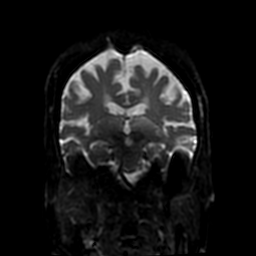
[im 48/64]
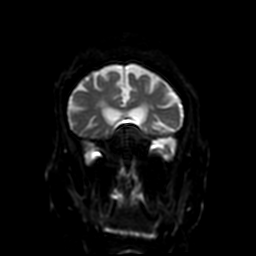
[im 64/64]
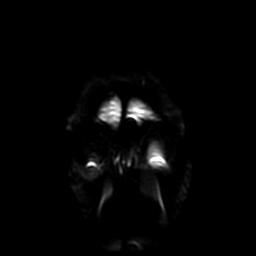

[Series 6: T2 · axial · 5.0mm · 0.47mm/px · z∈[-75,+68]mm · 2 of 25 slices shown (1 of 2)]
[im 1/25]
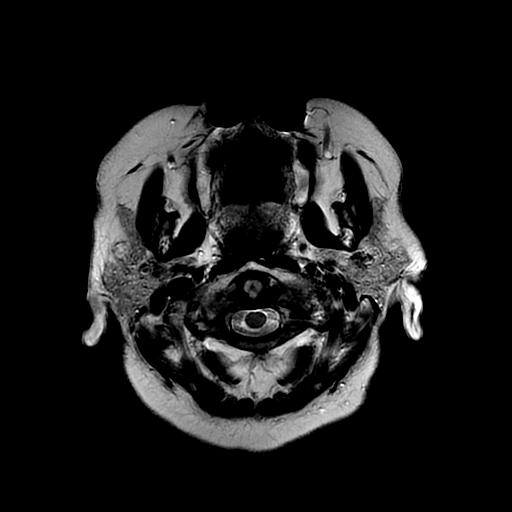
[im 25/25]
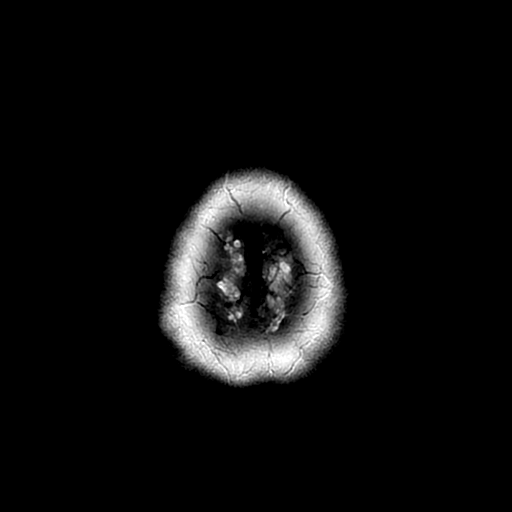

[Series 7: FLAIR · axial · 5.0mm · 0.47mm/px · z∈[-75,+68]mm · 2 of 25 slices shown (2 of 2)]
[im 1/25]
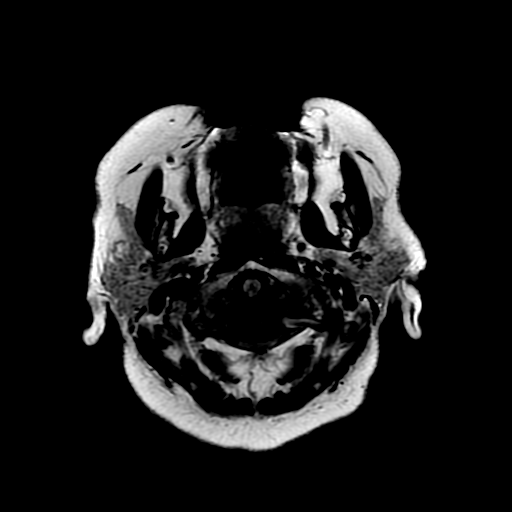
[im 25/25]
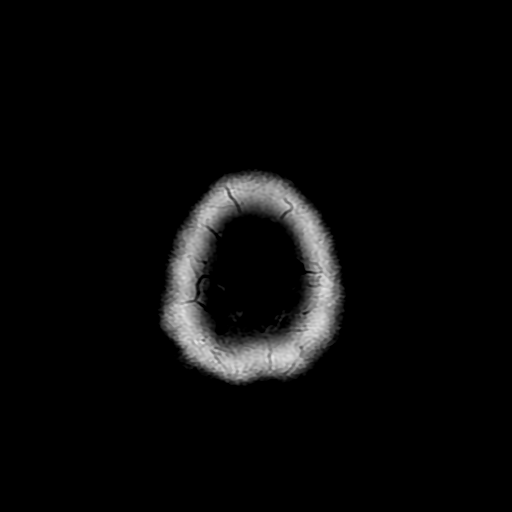

[Series 8: (person_name) · axial · 3.0mm · 0.47mm/px · z∈[-73,+47]mm · 7 of 96 slices shown]
[im 1/96]
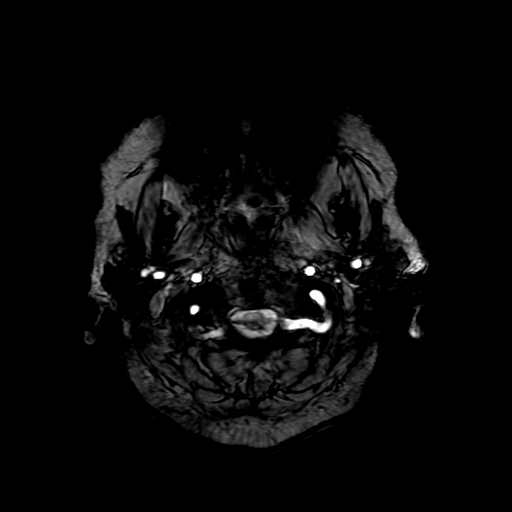
[im 14/96]
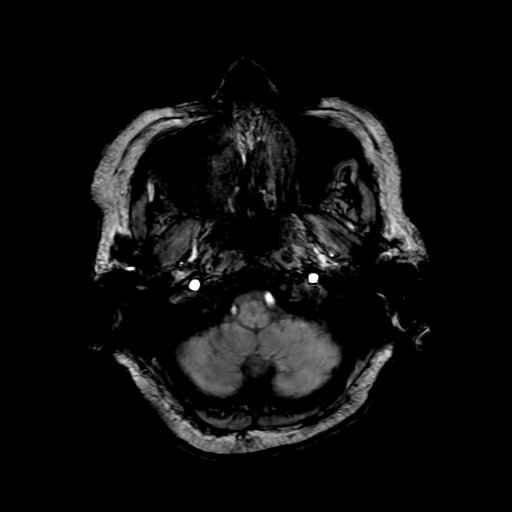
[im 28/96]
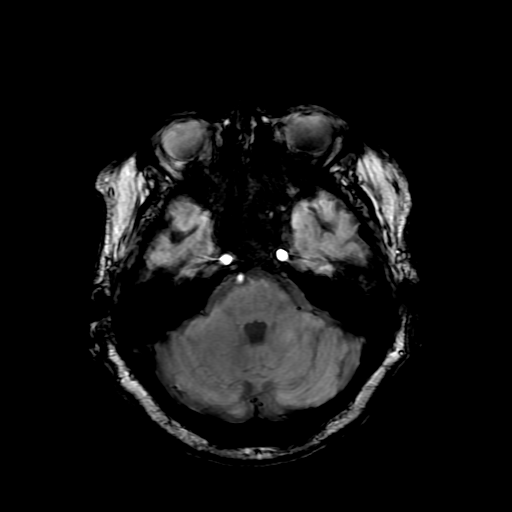
[im 41/96]
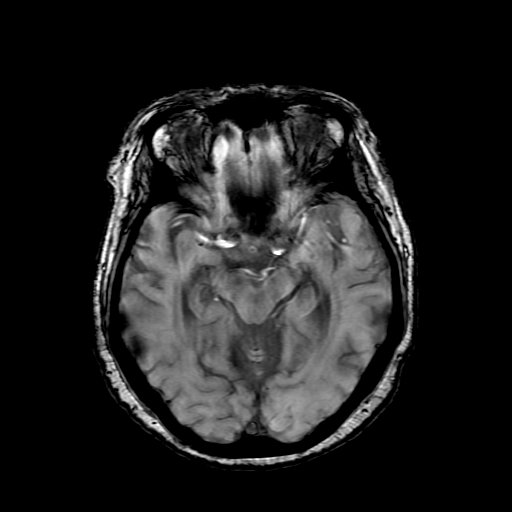
[im 55/96]
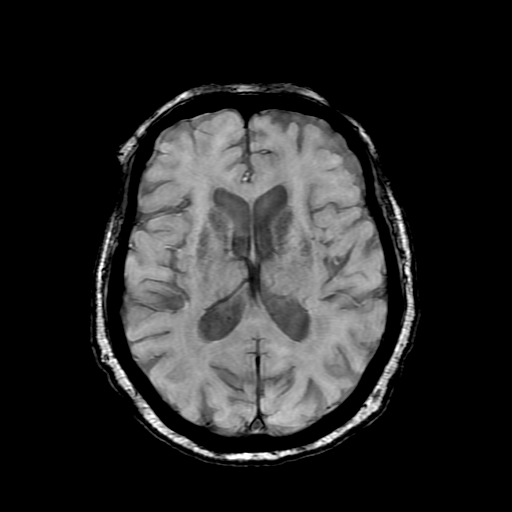
[im 68/96]
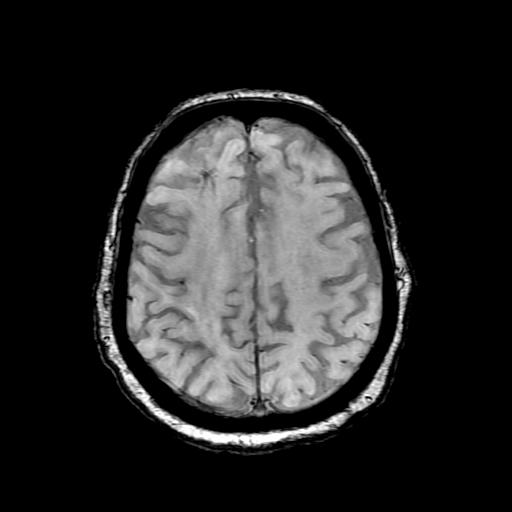
[im 82/96]
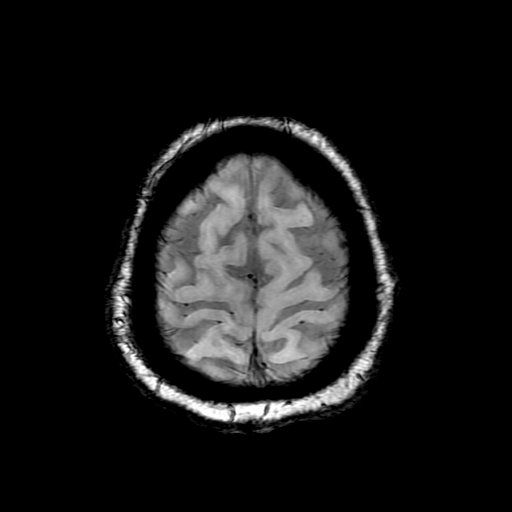

[Series 10: T2 · coronal · 5.0mm · 0.39mm/px · 2 of 28 slices shown (2 of 2)]
[im 1/28]
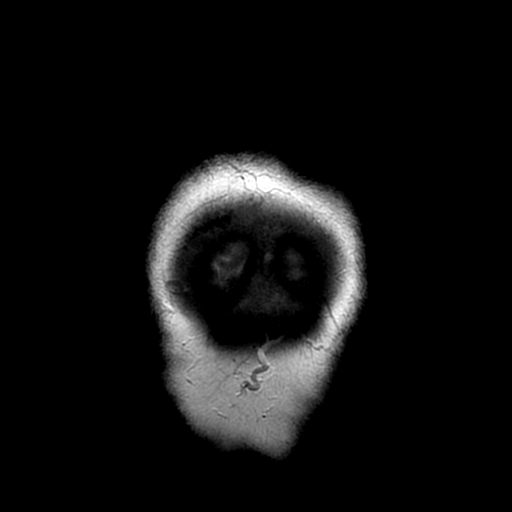
[im 28/28]
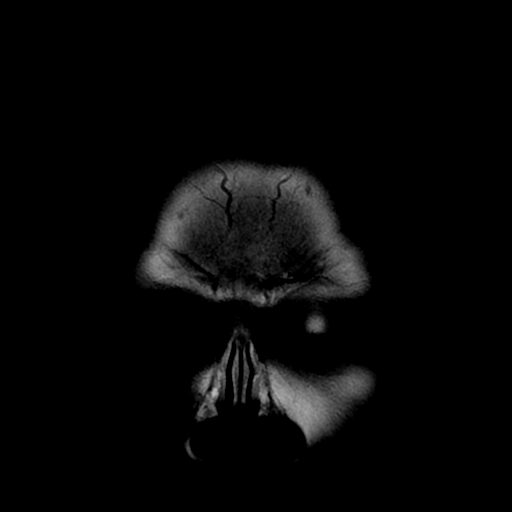

[Series 400: DWI · axial · 3.0mm · 0.94mm/px · z∈[-67,+70]mm · 4 of 47 slices shown (3 of 4)]
[im 1/47]
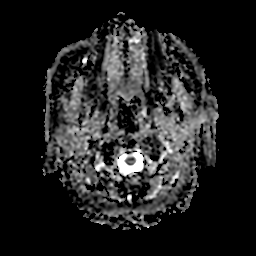
[im 16/47]
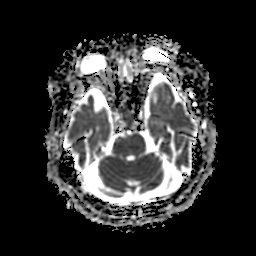
[im 31/47]
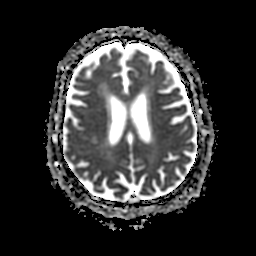
[im 47/47]
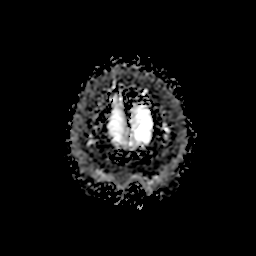

[Series 500: DWI · coronal · 5.0mm · 0.94mm/px · 2 of 31 slices shown (4 of 4)]
[im 1/31]
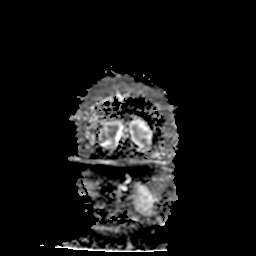
[im 31/31]
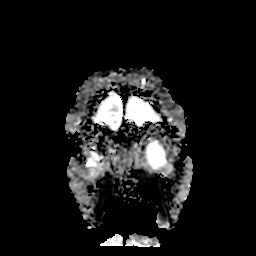

[33 of 48 positions shown; findings below may reference images not displayed]

FINDINGS: Generalized atrophy which is stable. Negative for hydrocephalus.
Chronic microvascular ischemic change in the white matter stable
from the prior study

Negative for acute infarct. Negative for hemorrhage or mass lesion.
No edema.

Bilateral lens replacement. No orbital mass. Paranasal sinuses
clear. Pituitary normal in size.
IMPRESSION: Atrophy and chronic microvascular ischemia, expected for age

Negative for acute abnormality.

## 2016-01-11 IMAGING — DX DG CHEST 2V
2 series · 2 of 2 positions shown · non-contrast
Comparison: 06/15/2014

CLINICAL DATA: Dizziness. Blurred vision and headache for 1 day.
History of hypertension.

EXAM:
CHEST  2 VIEW

[chest lat]
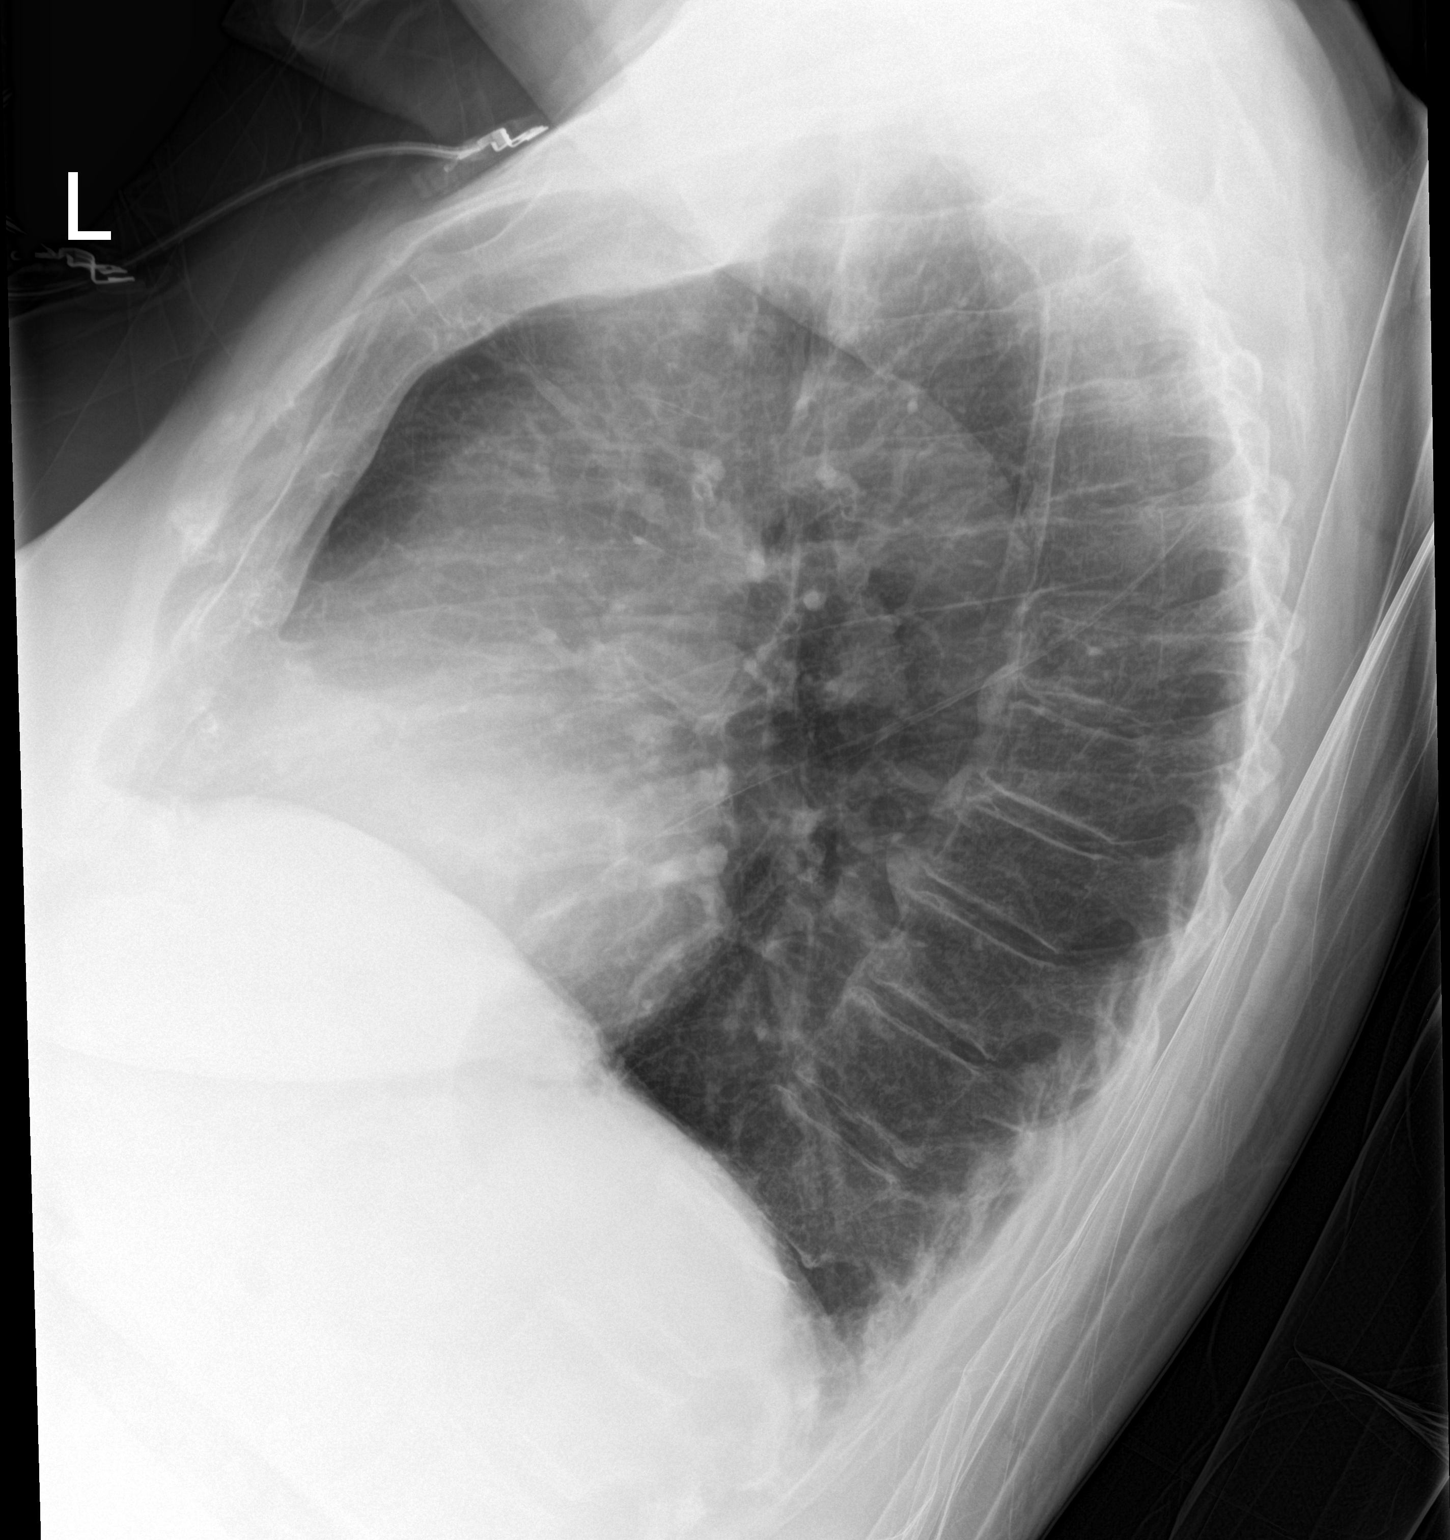

[chest ap]
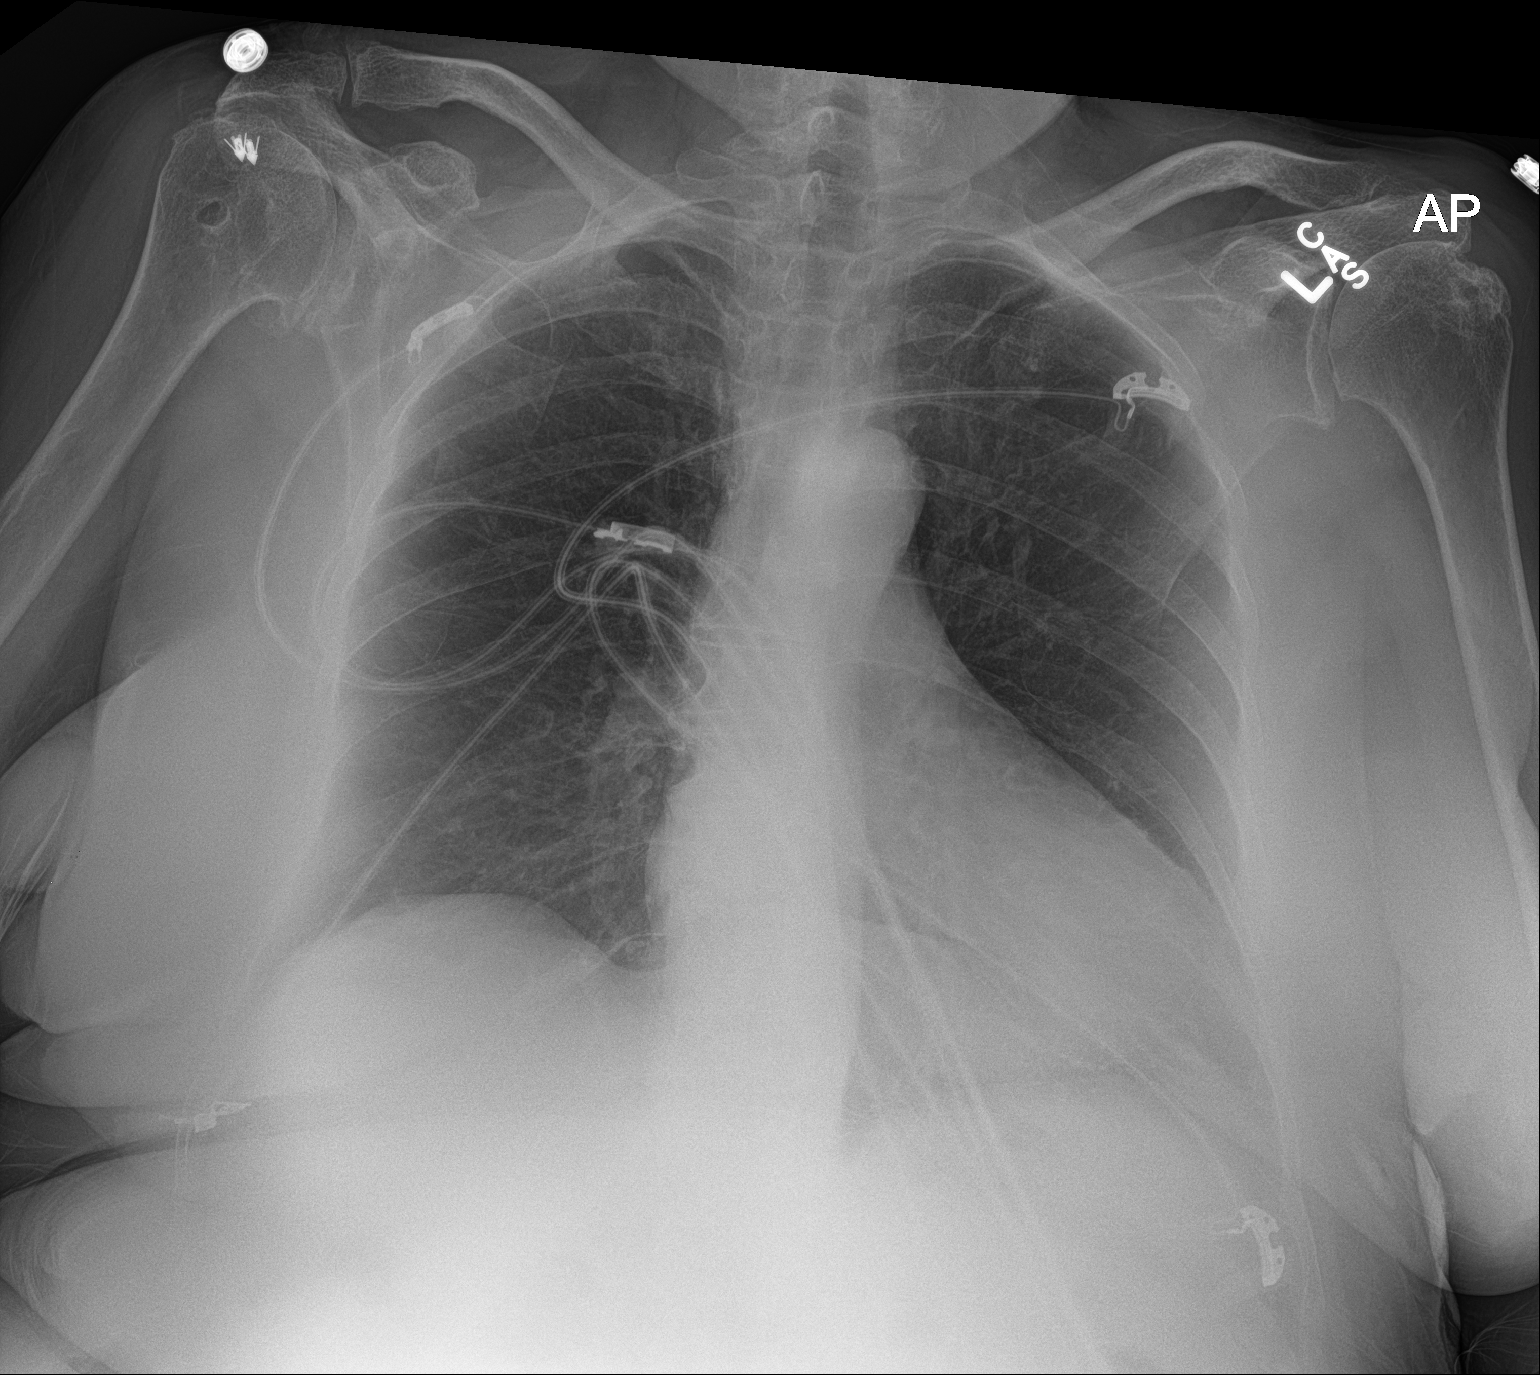

[2 of 2 positions shown; findings below may reference images not displayed]

FINDINGS: Right worse than left glenohumeral joint osteoarthritis. Midline
trachea. Mild cardiomegaly. Mediastinal contours otherwise within
normal limits. No pleural effusion or pneumothorax. No congestive
failure. Clear lungs.
IMPRESSION: Mild cardiomegaly, without acute disease.

## 2016-01-12 DIAGNOSIS — G0430 Acute necrotizing hemorrhagic encephalopathy, unspecified: Secondary | ICD-10-CM | POA: Diagnosis not present

## 2016-01-13 DIAGNOSIS — G0430 Acute necrotizing hemorrhagic encephalopathy, unspecified: Secondary | ICD-10-CM | POA: Diagnosis not present

## 2016-01-13 IMAGING — CR DG CHEST 2V
2 series · 2 of 2 positions shown · non-contrast
Comparison: 10/24/2014

CLINICAL DATA: Fatigue for 2 days

EXAM:
CHEST  2 VIEW

[chest lat]
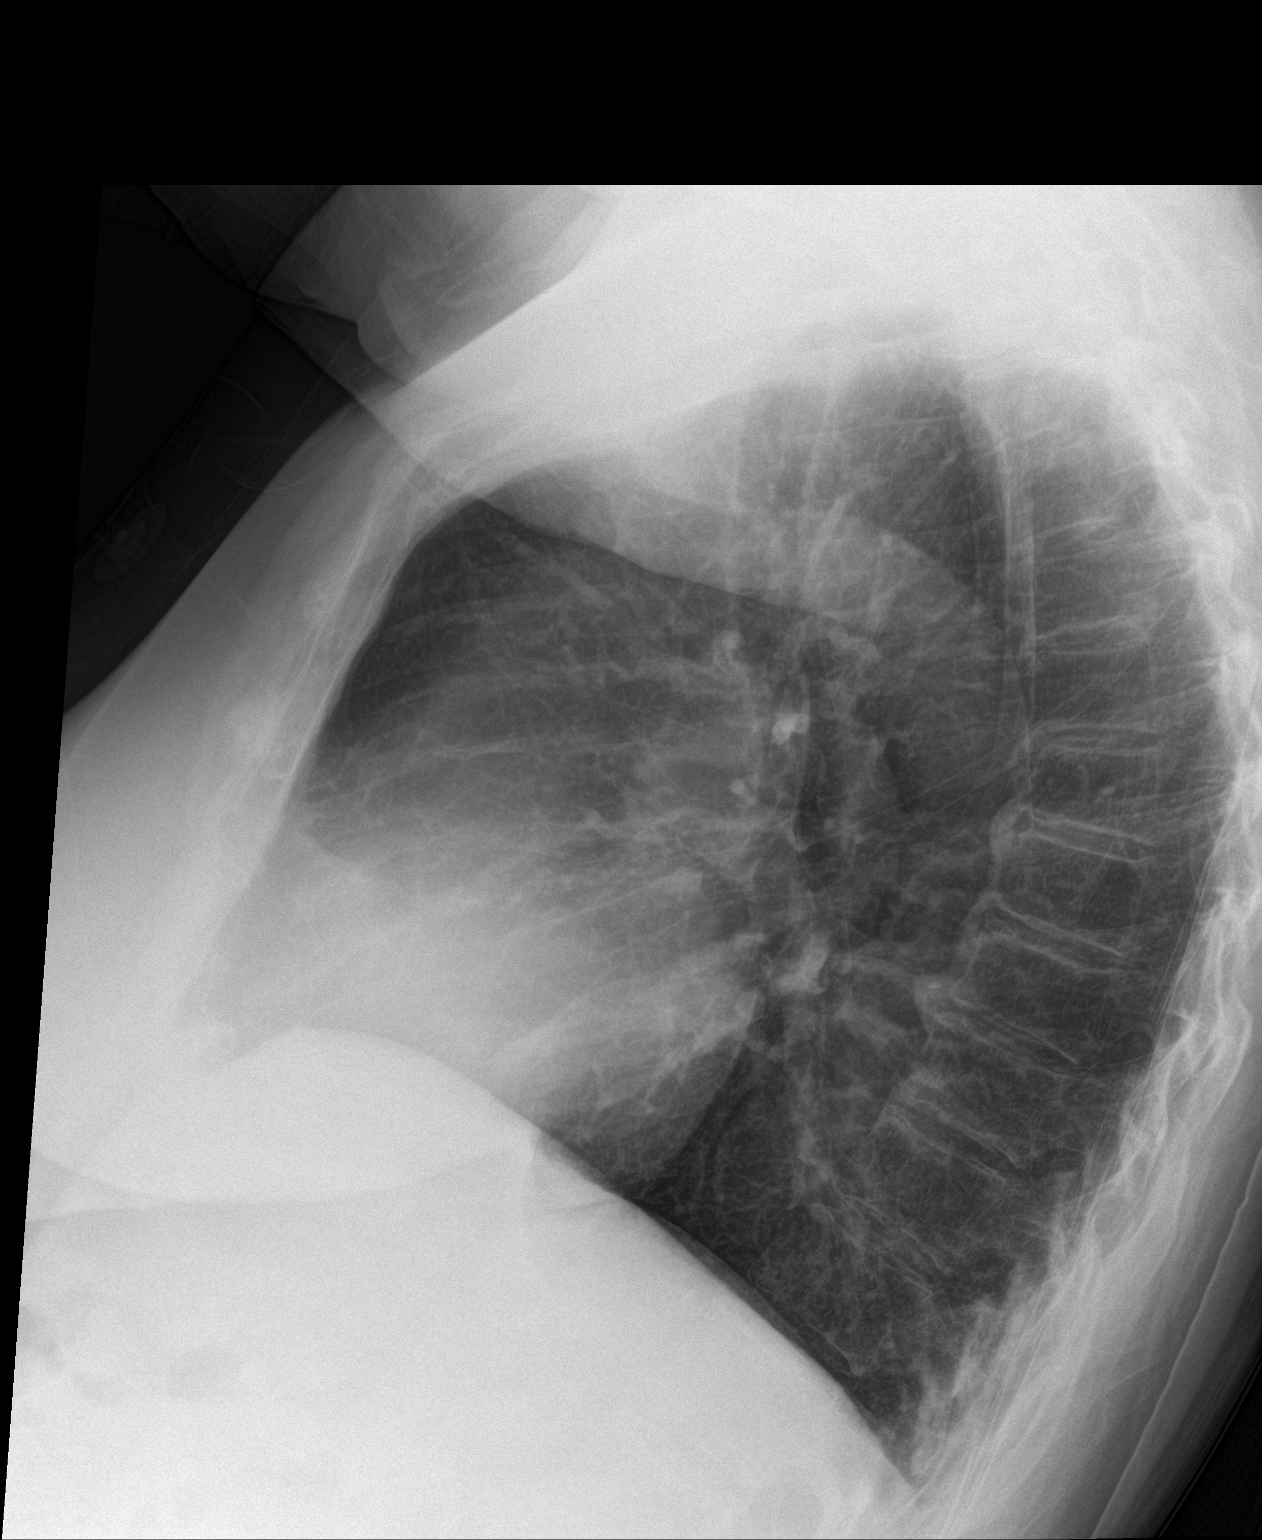

[chest ap]
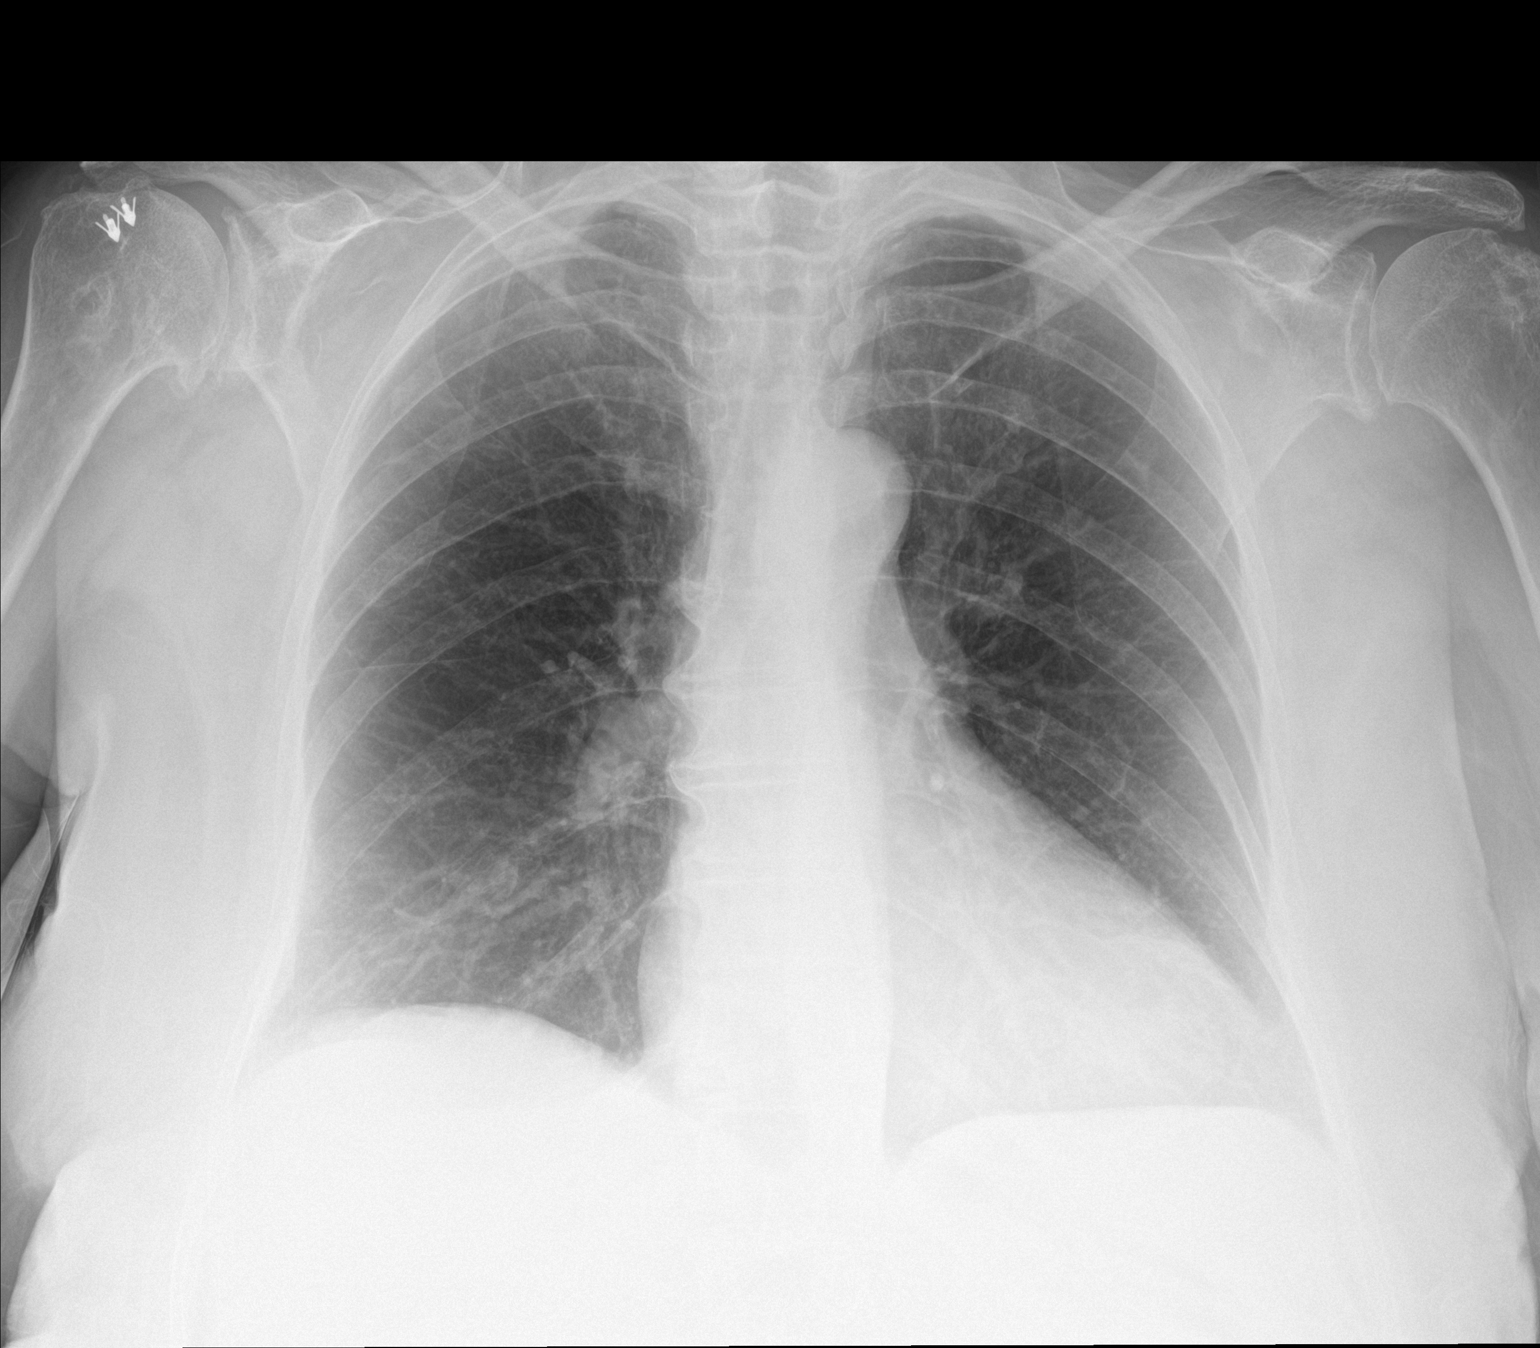

[2 of 2 positions shown; findings below may reference images not displayed]

FINDINGS: Cardiac shadow is at the upper limits of normal in size. The lungs
are clear bilaterally. No effusion is. Postsurgical changes are
noted in the right shoulder. No acute bony abnormality is seen.
IMPRESSION: No acute abnormality noted.

## 2016-01-14 DIAGNOSIS — G0430 Acute necrotizing hemorrhagic encephalopathy, unspecified: Secondary | ICD-10-CM | POA: Diagnosis not present

## 2016-01-14 DIAGNOSIS — F0281 Dementia in other diseases classified elsewhere with behavioral disturbance: Secondary | ICD-10-CM | POA: Diagnosis not present

## 2016-01-14 DIAGNOSIS — N390 Urinary tract infection, site not specified: Secondary | ICD-10-CM | POA: Diagnosis not present

## 2016-01-14 DIAGNOSIS — G301 Alzheimer's disease with late onset: Secondary | ICD-10-CM | POA: Diagnosis not present

## 2016-01-14 DIAGNOSIS — W19XXXA Unspecified fall, initial encounter: Secondary | ICD-10-CM | POA: Diagnosis not present

## 2016-01-15 DIAGNOSIS — G0430 Acute necrotizing hemorrhagic encephalopathy, unspecified: Secondary | ICD-10-CM | POA: Diagnosis not present

## 2016-01-16 DIAGNOSIS — G0430 Acute necrotizing hemorrhagic encephalopathy, unspecified: Secondary | ICD-10-CM | POA: Diagnosis not present

## 2016-01-17 DIAGNOSIS — G0430 Acute necrotizing hemorrhagic encephalopathy, unspecified: Secondary | ICD-10-CM | POA: Diagnosis not present

## 2016-01-18 DIAGNOSIS — G0430 Acute necrotizing hemorrhagic encephalopathy, unspecified: Secondary | ICD-10-CM | POA: Diagnosis not present

## 2016-01-19 DIAGNOSIS — G0430 Acute necrotizing hemorrhagic encephalopathy, unspecified: Secondary | ICD-10-CM | POA: Diagnosis not present

## 2016-01-20 DIAGNOSIS — G0430 Acute necrotizing hemorrhagic encephalopathy, unspecified: Secondary | ICD-10-CM | POA: Diagnosis not present

## 2016-01-21 DIAGNOSIS — G0430 Acute necrotizing hemorrhagic encephalopathy, unspecified: Secondary | ICD-10-CM | POA: Diagnosis not present

## 2016-01-22 DIAGNOSIS — G0430 Acute necrotizing hemorrhagic encephalopathy, unspecified: Secondary | ICD-10-CM | POA: Diagnosis not present

## 2016-01-23 DIAGNOSIS — G0430 Acute necrotizing hemorrhagic encephalopathy, unspecified: Secondary | ICD-10-CM | POA: Diagnosis not present

## 2016-01-24 DIAGNOSIS — G0430 Acute necrotizing hemorrhagic encephalopathy, unspecified: Secondary | ICD-10-CM | POA: Diagnosis not present

## 2016-01-25 DIAGNOSIS — G0430 Acute necrotizing hemorrhagic encephalopathy, unspecified: Secondary | ICD-10-CM | POA: Diagnosis not present

## 2016-01-26 DIAGNOSIS — G0430 Acute necrotizing hemorrhagic encephalopathy, unspecified: Secondary | ICD-10-CM | POA: Diagnosis not present

## 2016-01-27 DIAGNOSIS — G0430 Acute necrotizing hemorrhagic encephalopathy, unspecified: Secondary | ICD-10-CM | POA: Diagnosis not present

## 2016-01-28 DIAGNOSIS — G0430 Acute necrotizing hemorrhagic encephalopathy, unspecified: Secondary | ICD-10-CM | POA: Diagnosis not present

## 2016-01-29 DIAGNOSIS — G0430 Acute necrotizing hemorrhagic encephalopathy, unspecified: Secondary | ICD-10-CM | POA: Diagnosis not present

## 2016-01-30 DIAGNOSIS — G0430 Acute necrotizing hemorrhagic encephalopathy, unspecified: Secondary | ICD-10-CM | POA: Diagnosis not present

## 2016-01-31 DIAGNOSIS — G0430 Acute necrotizing hemorrhagic encephalopathy, unspecified: Secondary | ICD-10-CM | POA: Diagnosis not present

## 2016-02-01 DIAGNOSIS — G0430 Acute necrotizing hemorrhagic encephalopathy, unspecified: Secondary | ICD-10-CM | POA: Diagnosis not present

## 2016-02-02 DIAGNOSIS — G0430 Acute necrotizing hemorrhagic encephalopathy, unspecified: Secondary | ICD-10-CM | POA: Diagnosis not present

## 2016-02-03 DIAGNOSIS — G0430 Acute necrotizing hemorrhagic encephalopathy, unspecified: Secondary | ICD-10-CM | POA: Diagnosis not present

## 2016-02-04 DIAGNOSIS — G0430 Acute necrotizing hemorrhagic encephalopathy, unspecified: Secondary | ICD-10-CM | POA: Diagnosis not present

## 2016-02-05 DIAGNOSIS — G0430 Acute necrotizing hemorrhagic encephalopathy, unspecified: Secondary | ICD-10-CM | POA: Diagnosis not present

## 2016-02-05 DIAGNOSIS — R32 Unspecified urinary incontinence: Secondary | ICD-10-CM | POA: Diagnosis not present

## 2016-02-06 DIAGNOSIS — G0430 Acute necrotizing hemorrhagic encephalopathy, unspecified: Secondary | ICD-10-CM | POA: Diagnosis not present

## 2016-02-07 DIAGNOSIS — G0430 Acute necrotizing hemorrhagic encephalopathy, unspecified: Secondary | ICD-10-CM | POA: Diagnosis not present

## 2016-02-08 DIAGNOSIS — G0430 Acute necrotizing hemorrhagic encephalopathy, unspecified: Secondary | ICD-10-CM | POA: Diagnosis not present

## 2016-02-09 DIAGNOSIS — G0430 Acute necrotizing hemorrhagic encephalopathy, unspecified: Secondary | ICD-10-CM | POA: Diagnosis not present

## 2016-02-10 DIAGNOSIS — G0430 Acute necrotizing hemorrhagic encephalopathy, unspecified: Secondary | ICD-10-CM | POA: Diagnosis not present

## 2016-02-11 DIAGNOSIS — G0430 Acute necrotizing hemorrhagic encephalopathy, unspecified: Secondary | ICD-10-CM | POA: Diagnosis not present

## 2016-02-12 DIAGNOSIS — G0430 Acute necrotizing hemorrhagic encephalopathy, unspecified: Secondary | ICD-10-CM | POA: Diagnosis not present

## 2016-02-12 IMAGING — CR DG CHEST 2V
2 series · 2 of 2 positions shown · non-contrast
Comparison: 10/26/2014

CLINICAL DATA: Left chest pain.  Fall.  Dementia.

EXAM:
CHEST  2 VIEW

[chest lat]
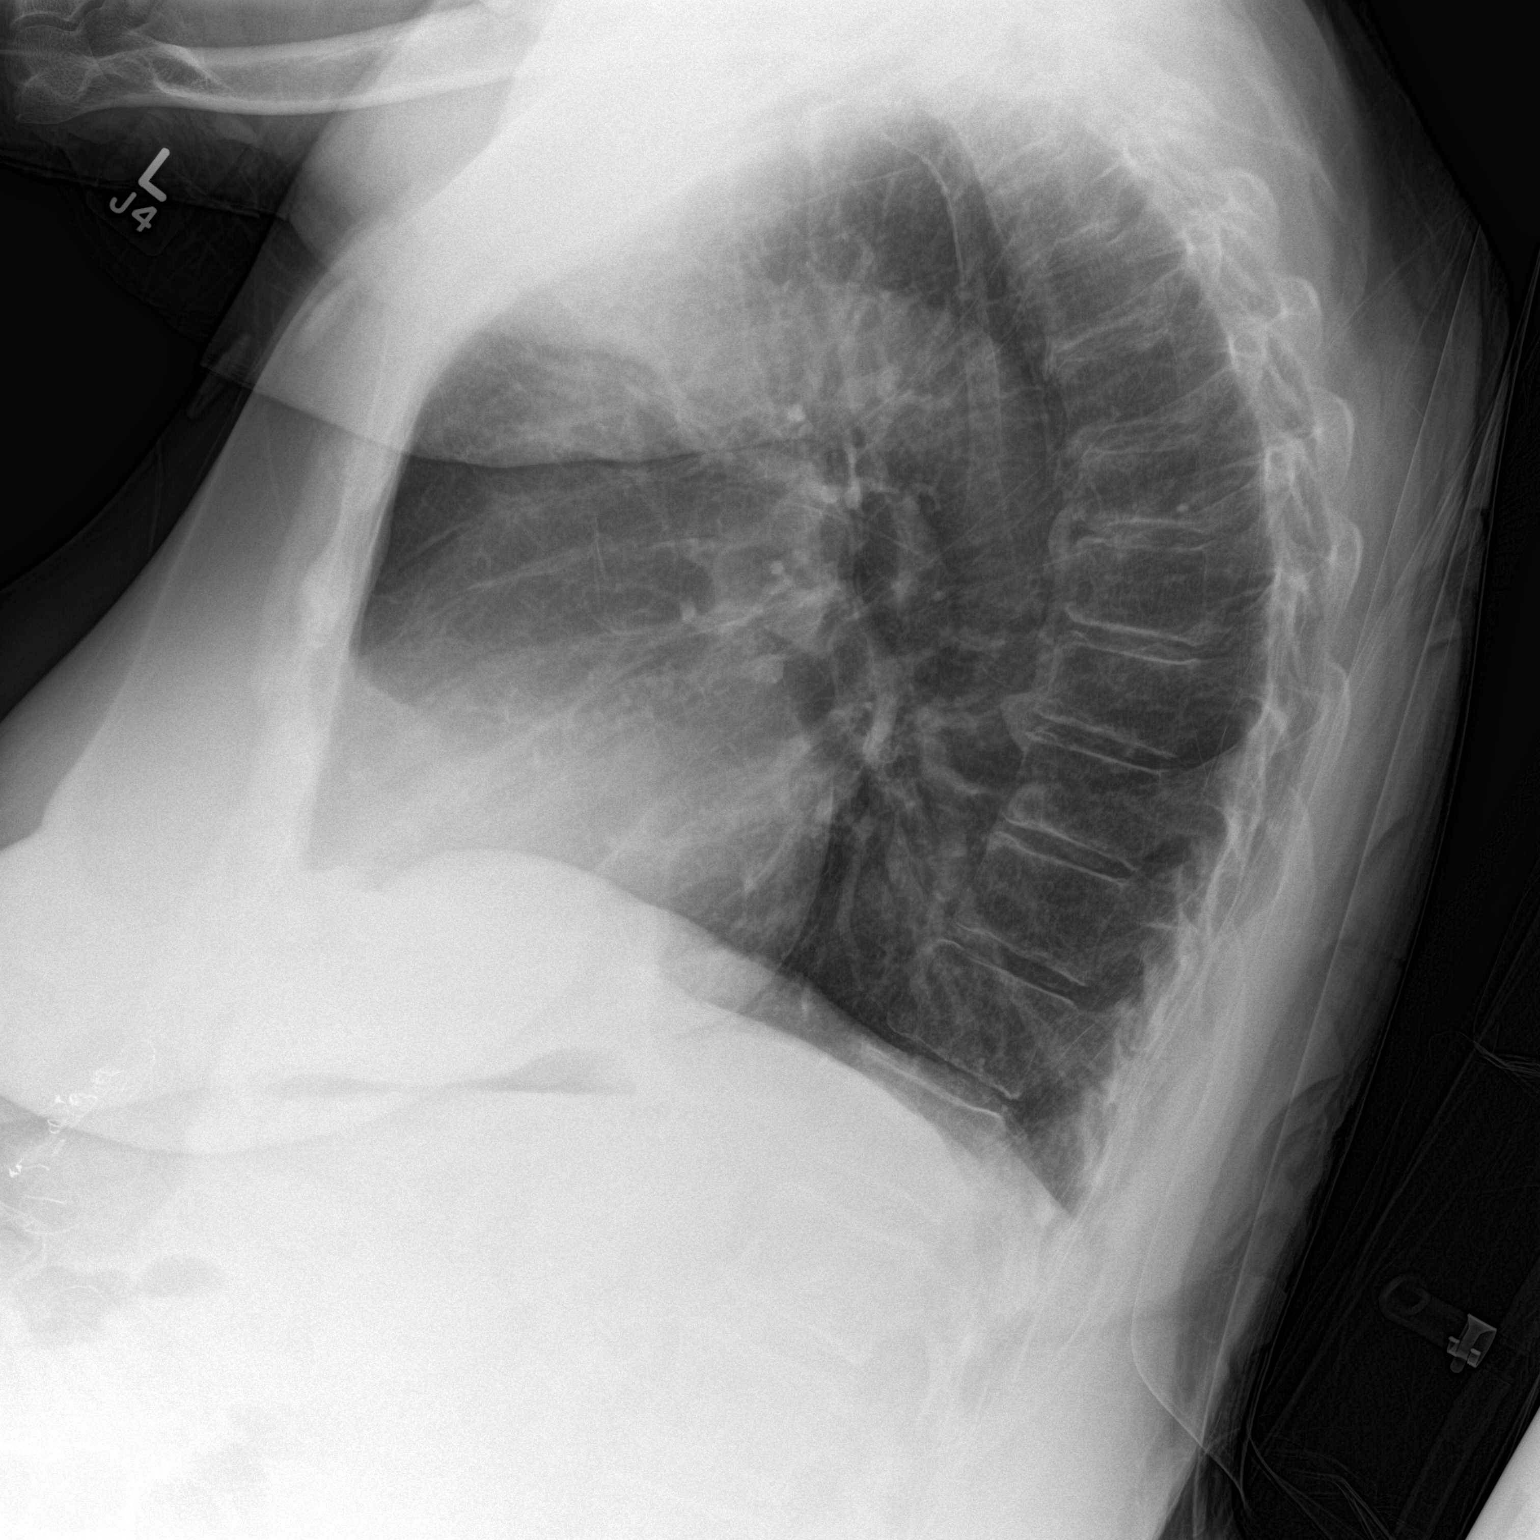

[chest ap]
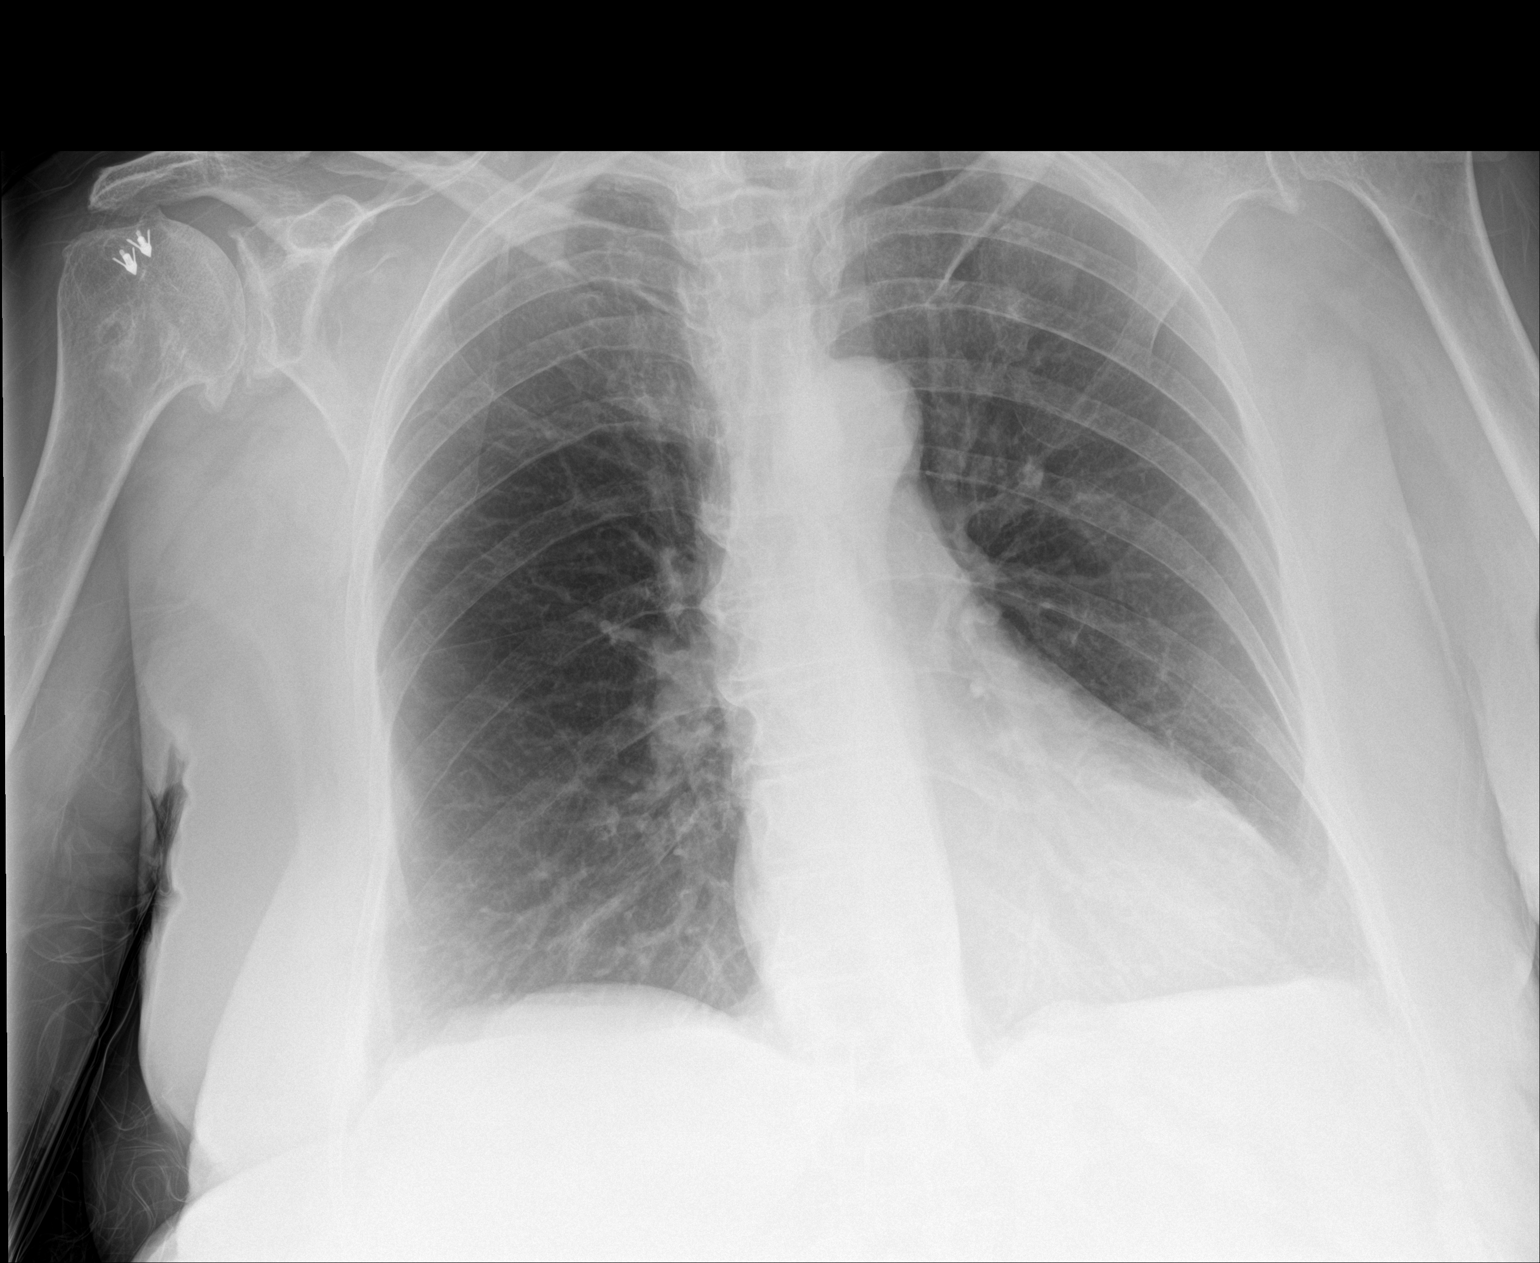

[2 of 2 positions shown; findings below may reference images not displayed]

FINDINGS: The The degenerative glenohumeral arthropathy bilaterally. Heart
size within normal limits.

Thoracic spondylosis.

No airspace opacity identified.  No pleural effusion noted.
IMPRESSION: 1. No pneumothorax, pleural effusion, or acute thoracic findings.
Please note that nondisplaced rib fractures can be occult on
conventional radiography.
2. Degenerative glenohumeral arthropathy bilaterally.

## 2016-02-12 IMAGING — CT CT HEAD W/O CM
2 series · 16 of 30 positions shown, 20 images · non-contrast
Comparison: MRI brain dated 10/24/2014

CLINICAL DATA: Right-sided numbness

EXAM:
CT HEAD WITHOUT CONTRAST
TECHNIQUE: Contiguous axial images were obtained from the base of the skull
through the vertex without intravenous contrast.

[Series 201: head w/o, idose (1) · axial · non-contrast · 0.49mm/px · z∈[+58,+188]mm · 13 of 32 slices shown, 17 images]
[im 3/32  brain]
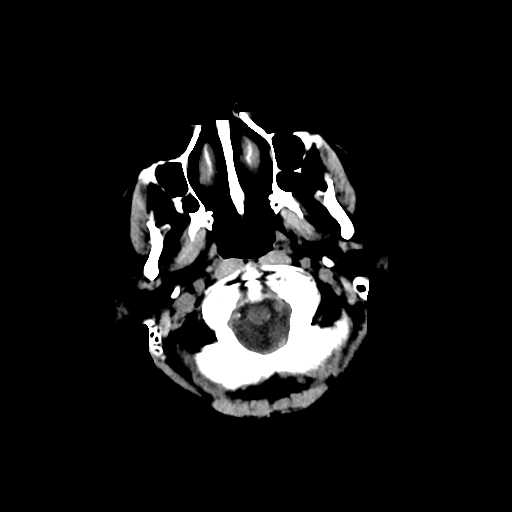
[im 3/32  bone]
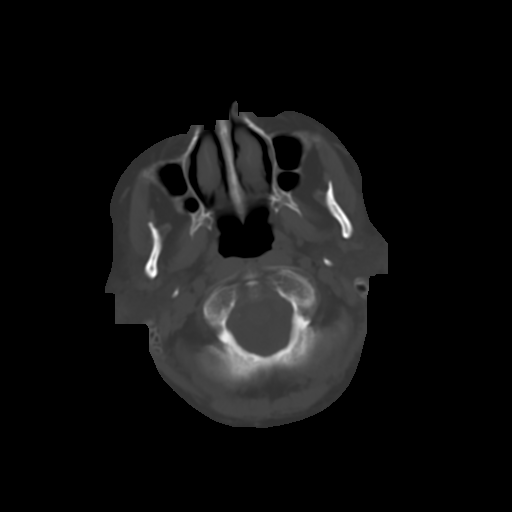
[im 5/32  brain]
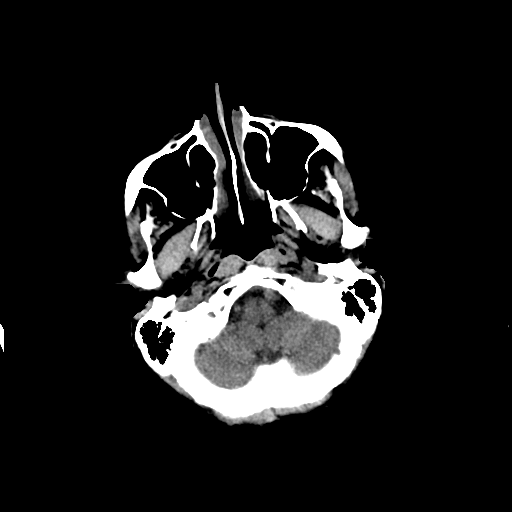
[im 7/32  brain]
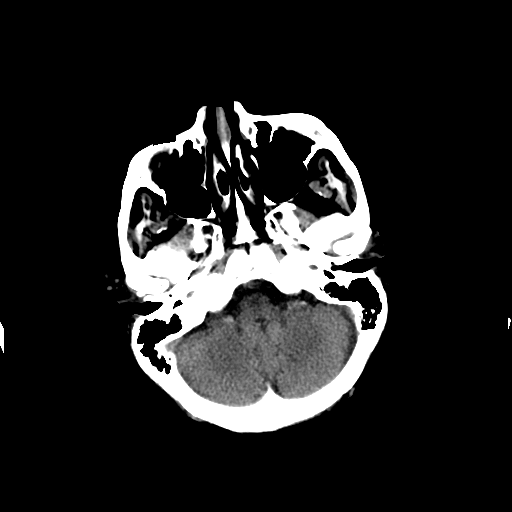
[im 9/32  brain]
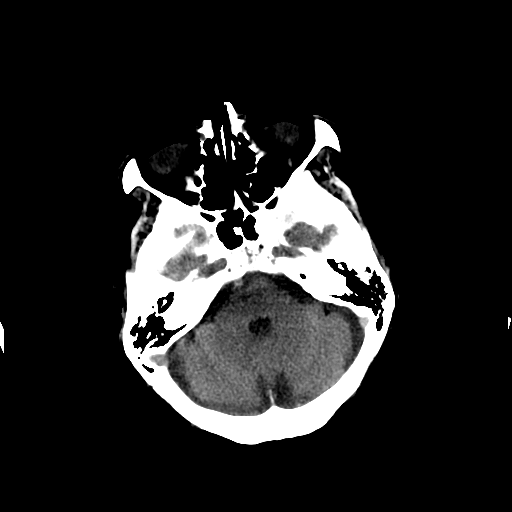
[im 12/32  brain]
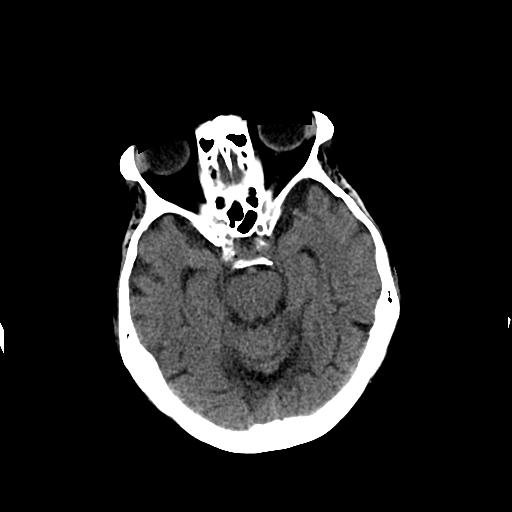
[im 12/32  bone]
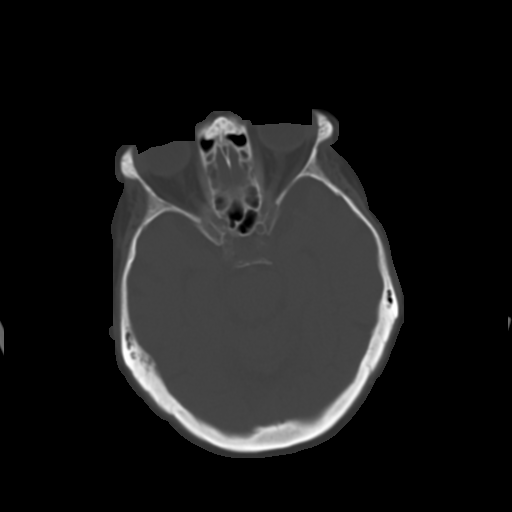
[im 14/32  brain]
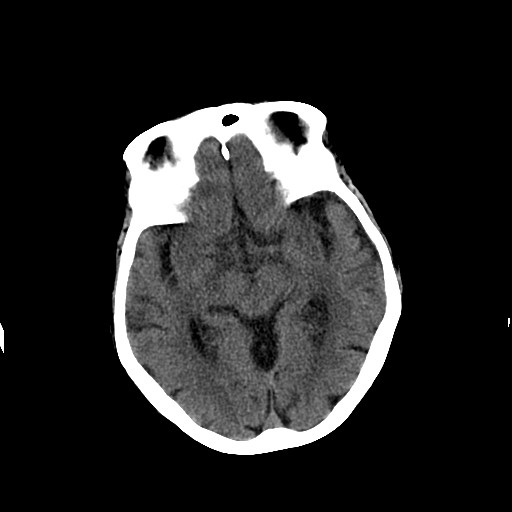
[im 16/32  brain]
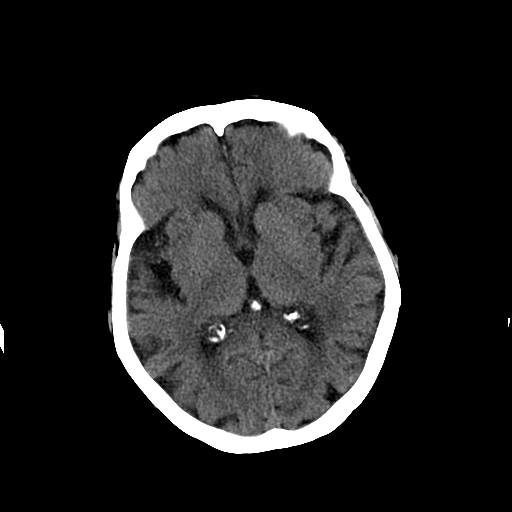
[im 18/32  brain]
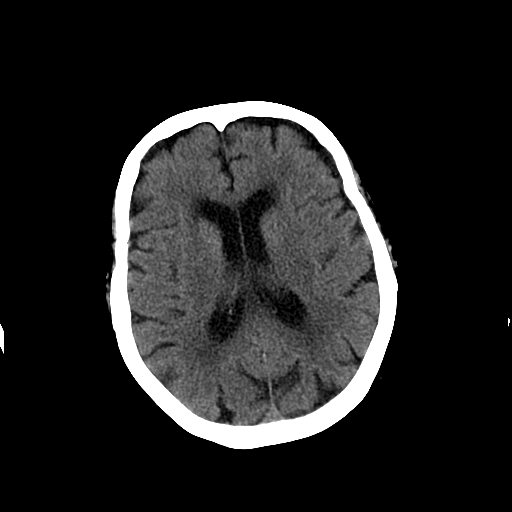
[im 20/32  brain]
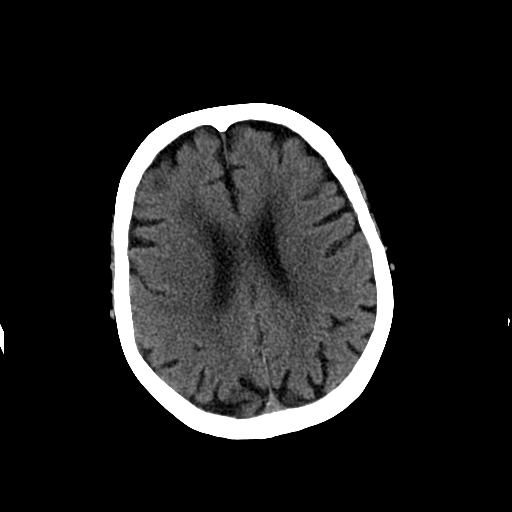
[im 20/32  bone]
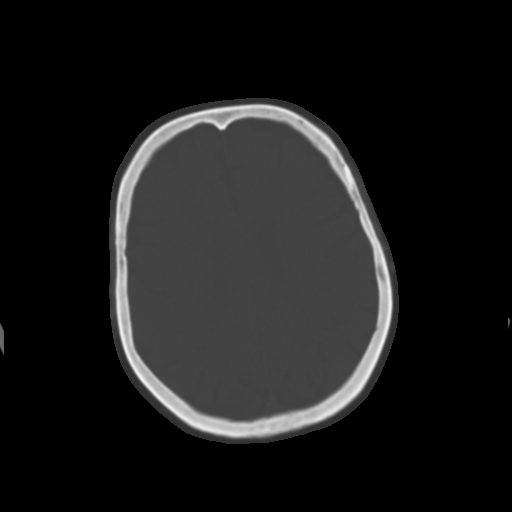
[im 23/32  brain]
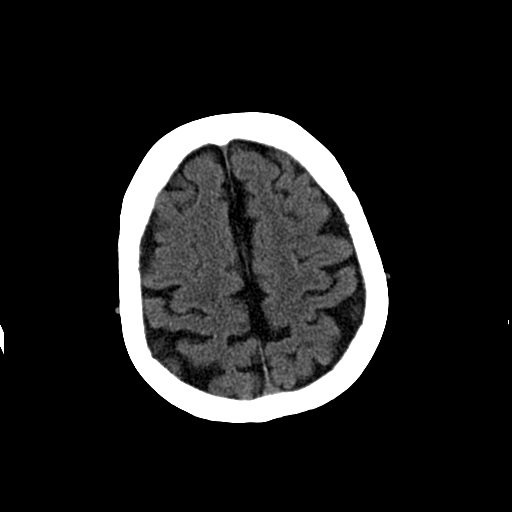
[im 25/32  brain]
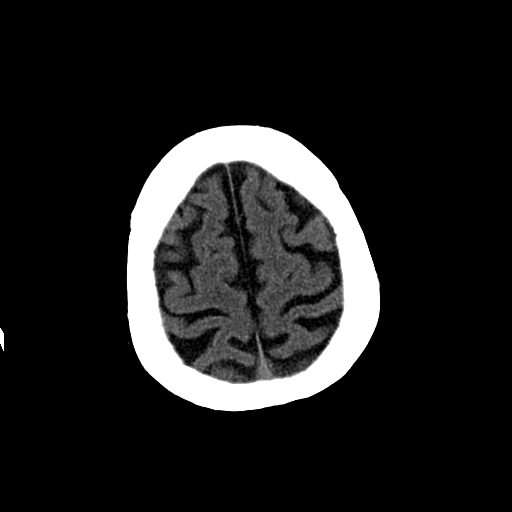
[im 27/32  brain]
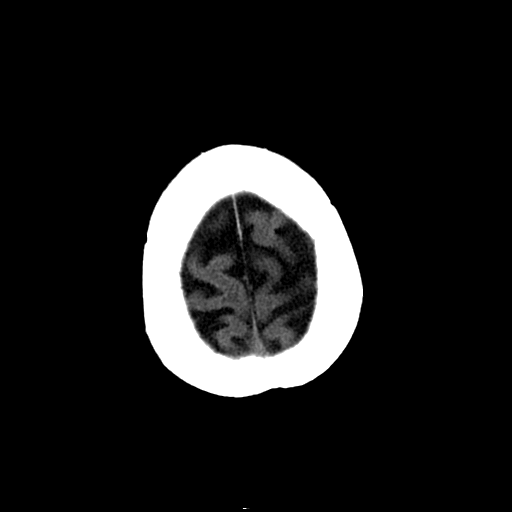
[im 29/32  brain]
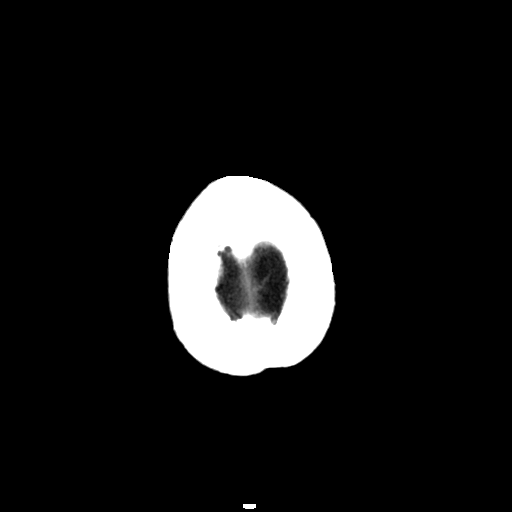
[im 29/32  bone]
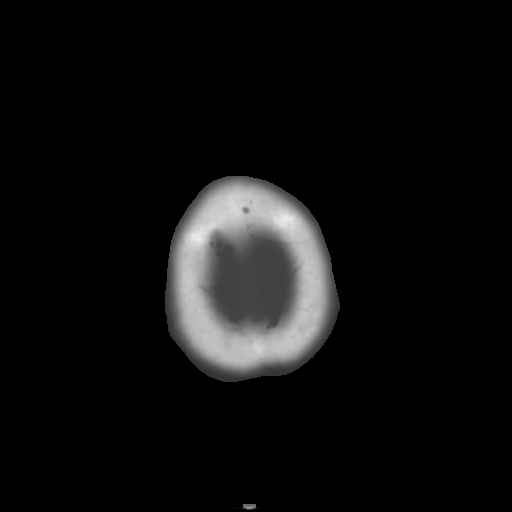

[Series 202: head w/o bone, idose (1) · axial · non-contrast · 0.49mm/px · z∈[+58,+103]mm · 3 of 32 slices shown]
[im 3/32  bone]
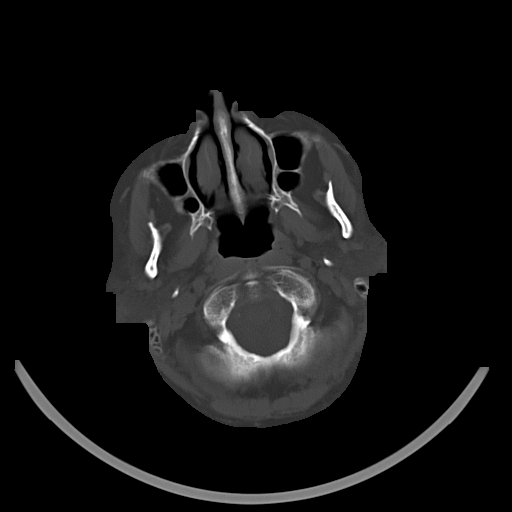
[im 7/32  bone]
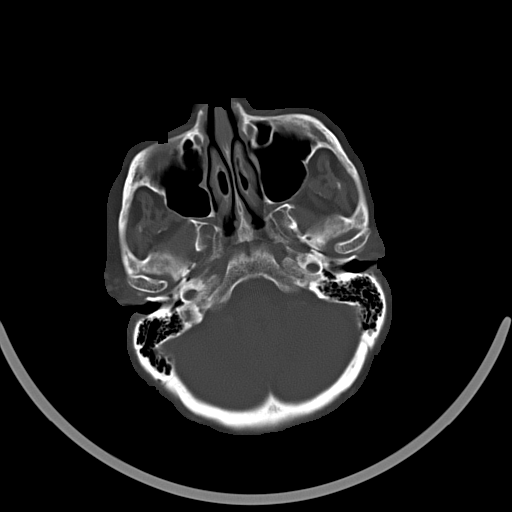
[im 12/32  bone]
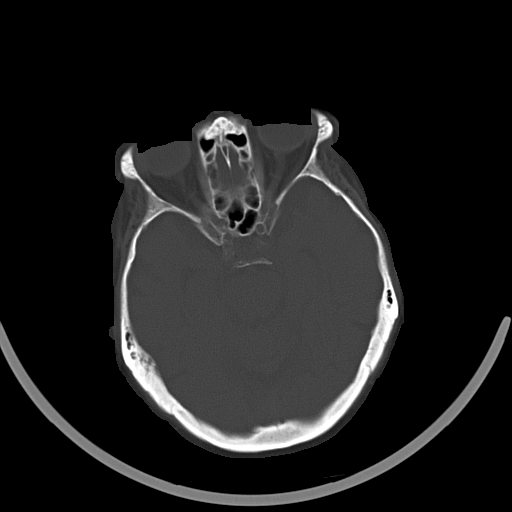

[16 of 30 positions shown; findings below may reference images not displayed]

FINDINGS: No evidence of parenchymal hemorrhage or extra-axial fluid
collection. No mass lesion, mass effect, or midline shift.

No CT evidence of acute infarction.

Subcortical white matter and periventricular small vessel ischemic
changes. Mild intracranial atherosclerosis.

Global cortical atrophy.  No ventriculomegaly.

The visualized paranasal sinuses are essentially clear. The mastoid
air cells are unopacified.

No evidence of calvarial fracture.
IMPRESSION: No evidence of acute intracranial abnormality.

Atrophy with small vessel ischemic changes.

## 2016-02-12 IMAGING — CR DG KNEE COMPLETE 4+V*R*
4 series · 4 of 4 positions shown · non-contrast
Comparison: None.

CLINICAL DATA: Fall, dementia, right hip pain

EXAM:
RIGHT KNEE - COMPLETE 4+ VIEW

[knee ap]
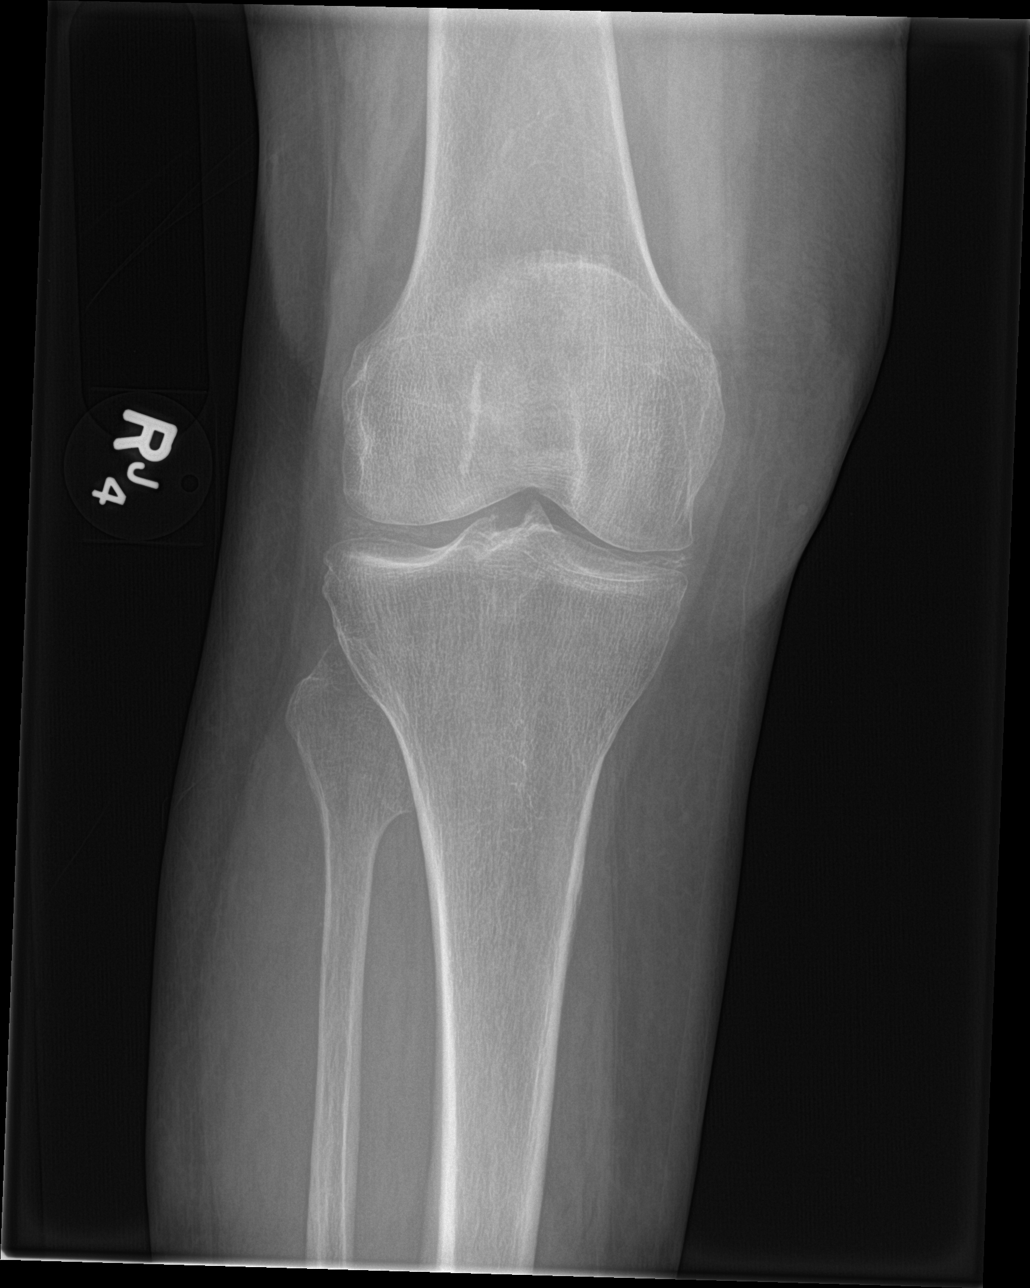

[knee lat]
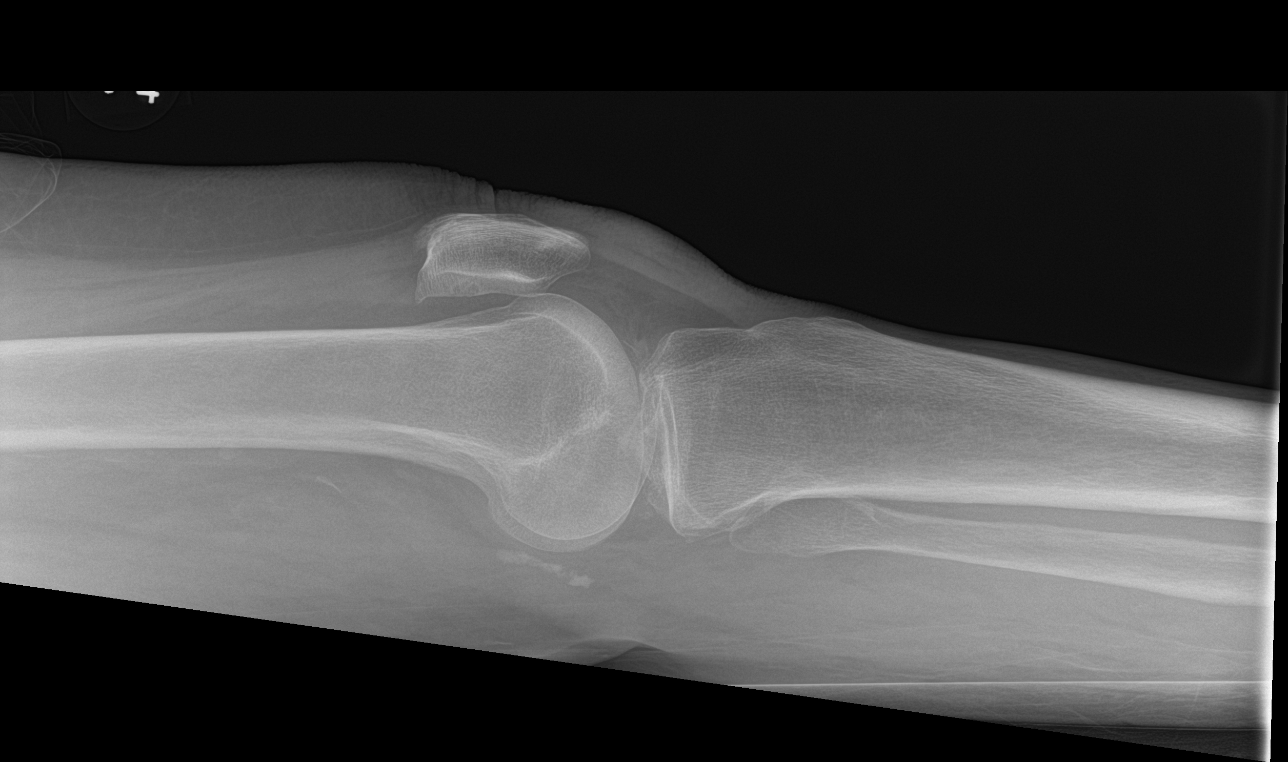

[knee obl (1 of 2)]
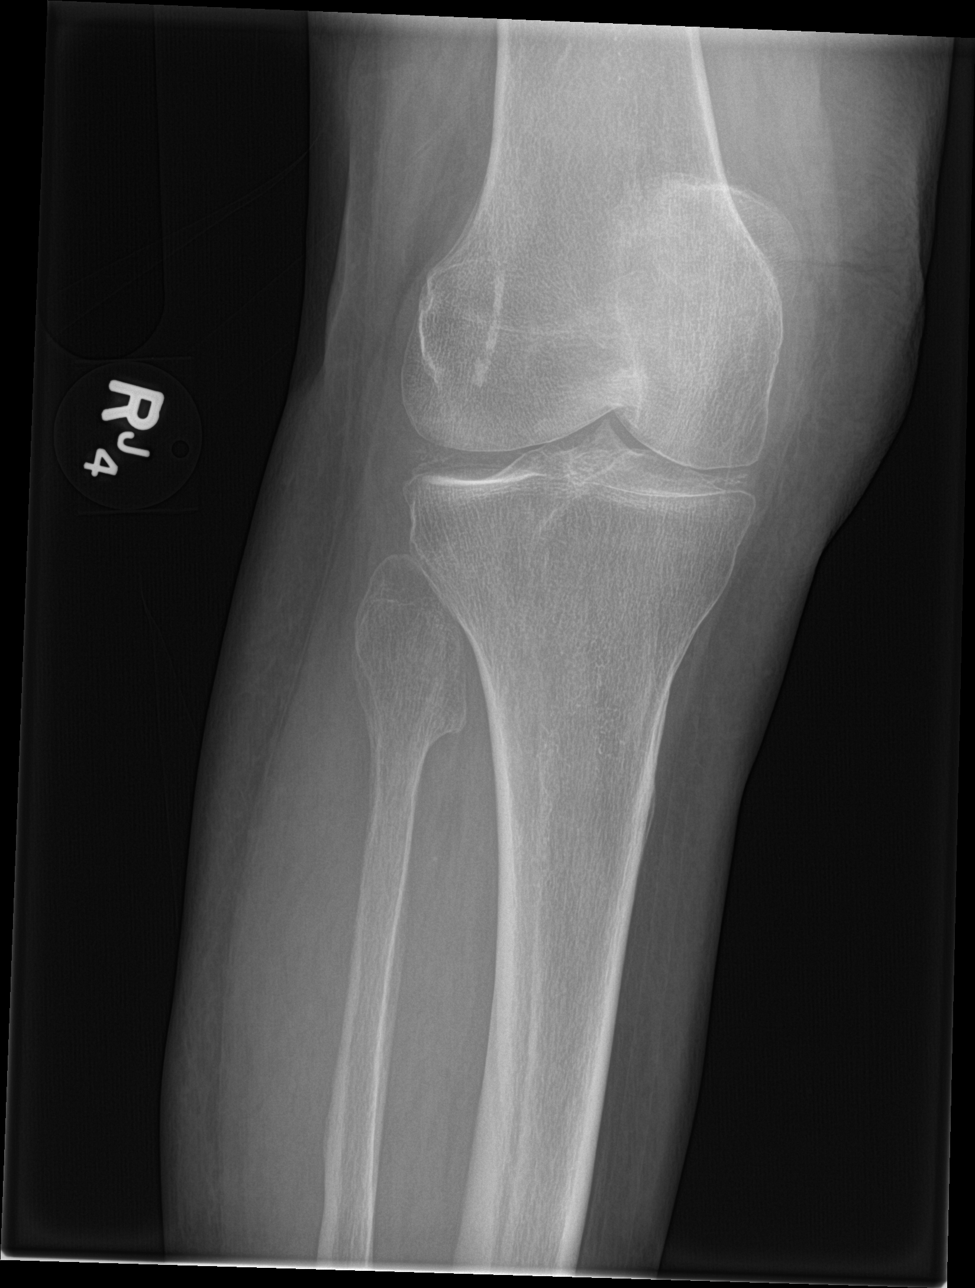

[knee obl (2 of 2)]
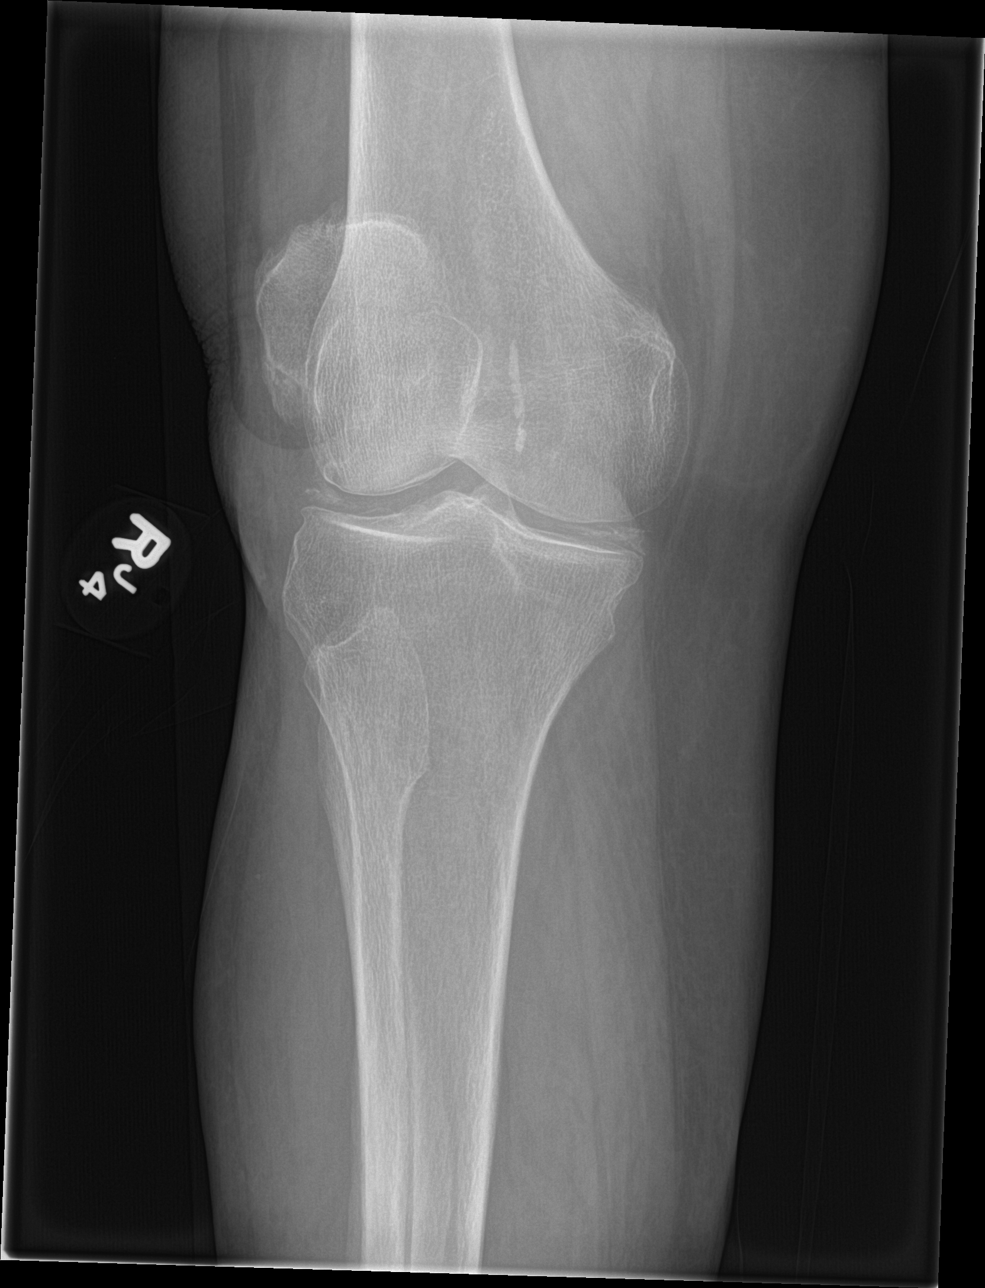

[4 of 4 positions shown; findings below may reference images not displayed]

FINDINGS: No fracture or dislocation is seen.

Moderate tricompartmental degenerative changes with
chondrocalcinosis.

Visualized soft tissues are within normal limits.

No suprapatellar knee joint effusion.
IMPRESSION: No fracture or dislocation is seen.

Moderate degenerative changes.

## 2016-02-12 IMAGING — CR DG HIP (WITH OR WITHOUT PELVIS) 2-3V*R*
3 series · 3 of 3 positions shown · non-contrast
Comparison: None.

CLINICAL DATA: Fall.  Dementia.  Right hip pain.

EXAM:
RIGHT HIP (WITH PELVIS) 2-3 VIEWS

[pelvis ap]
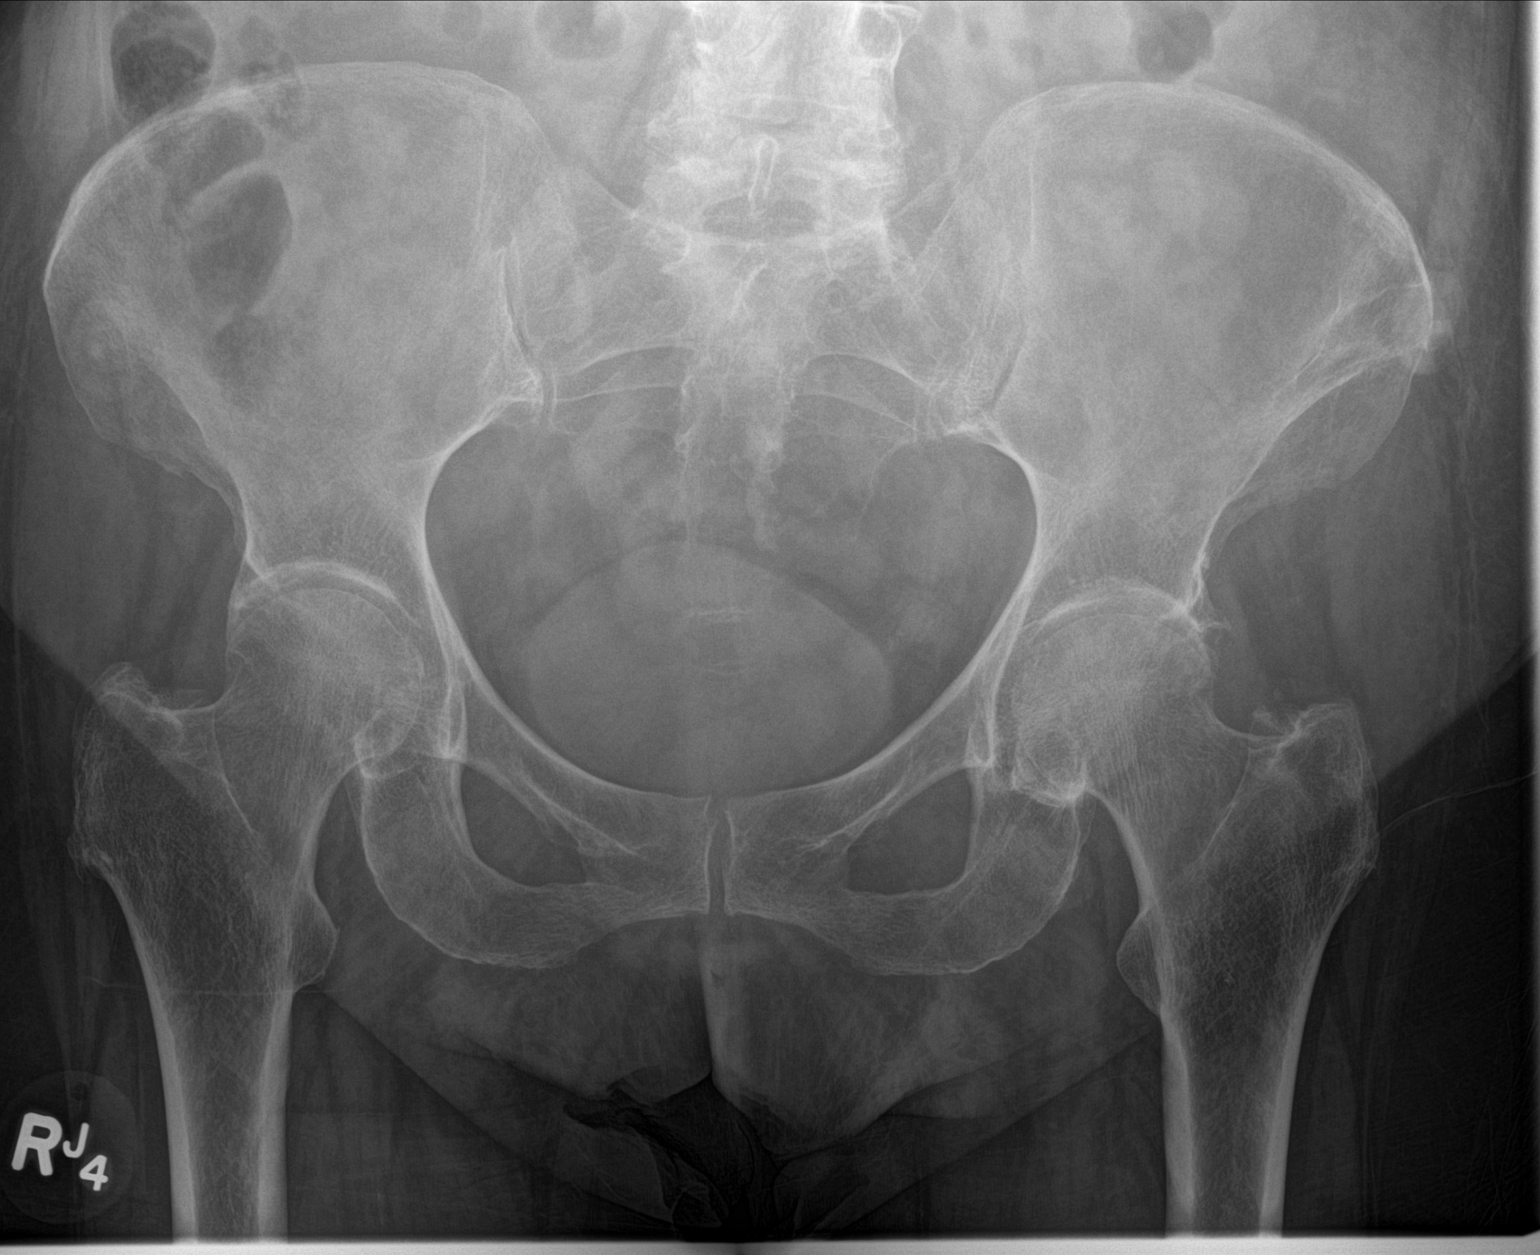

[hip ap]
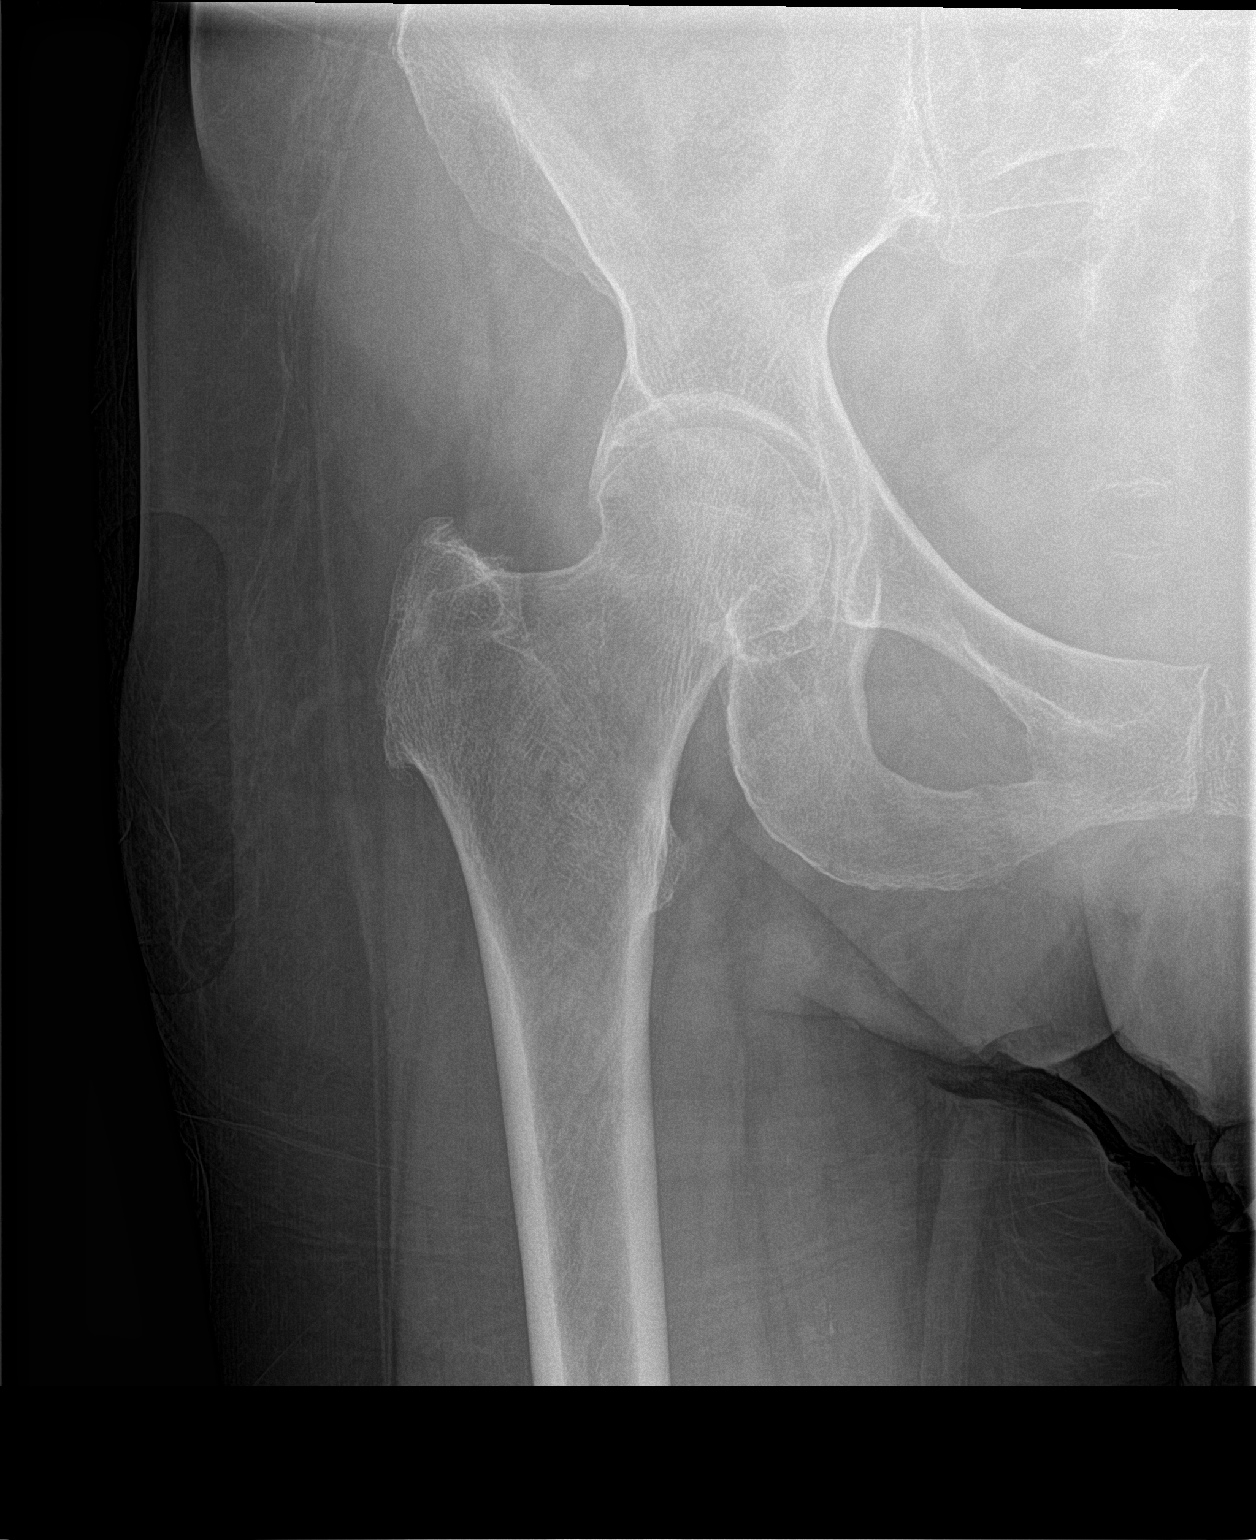

[hip lat]
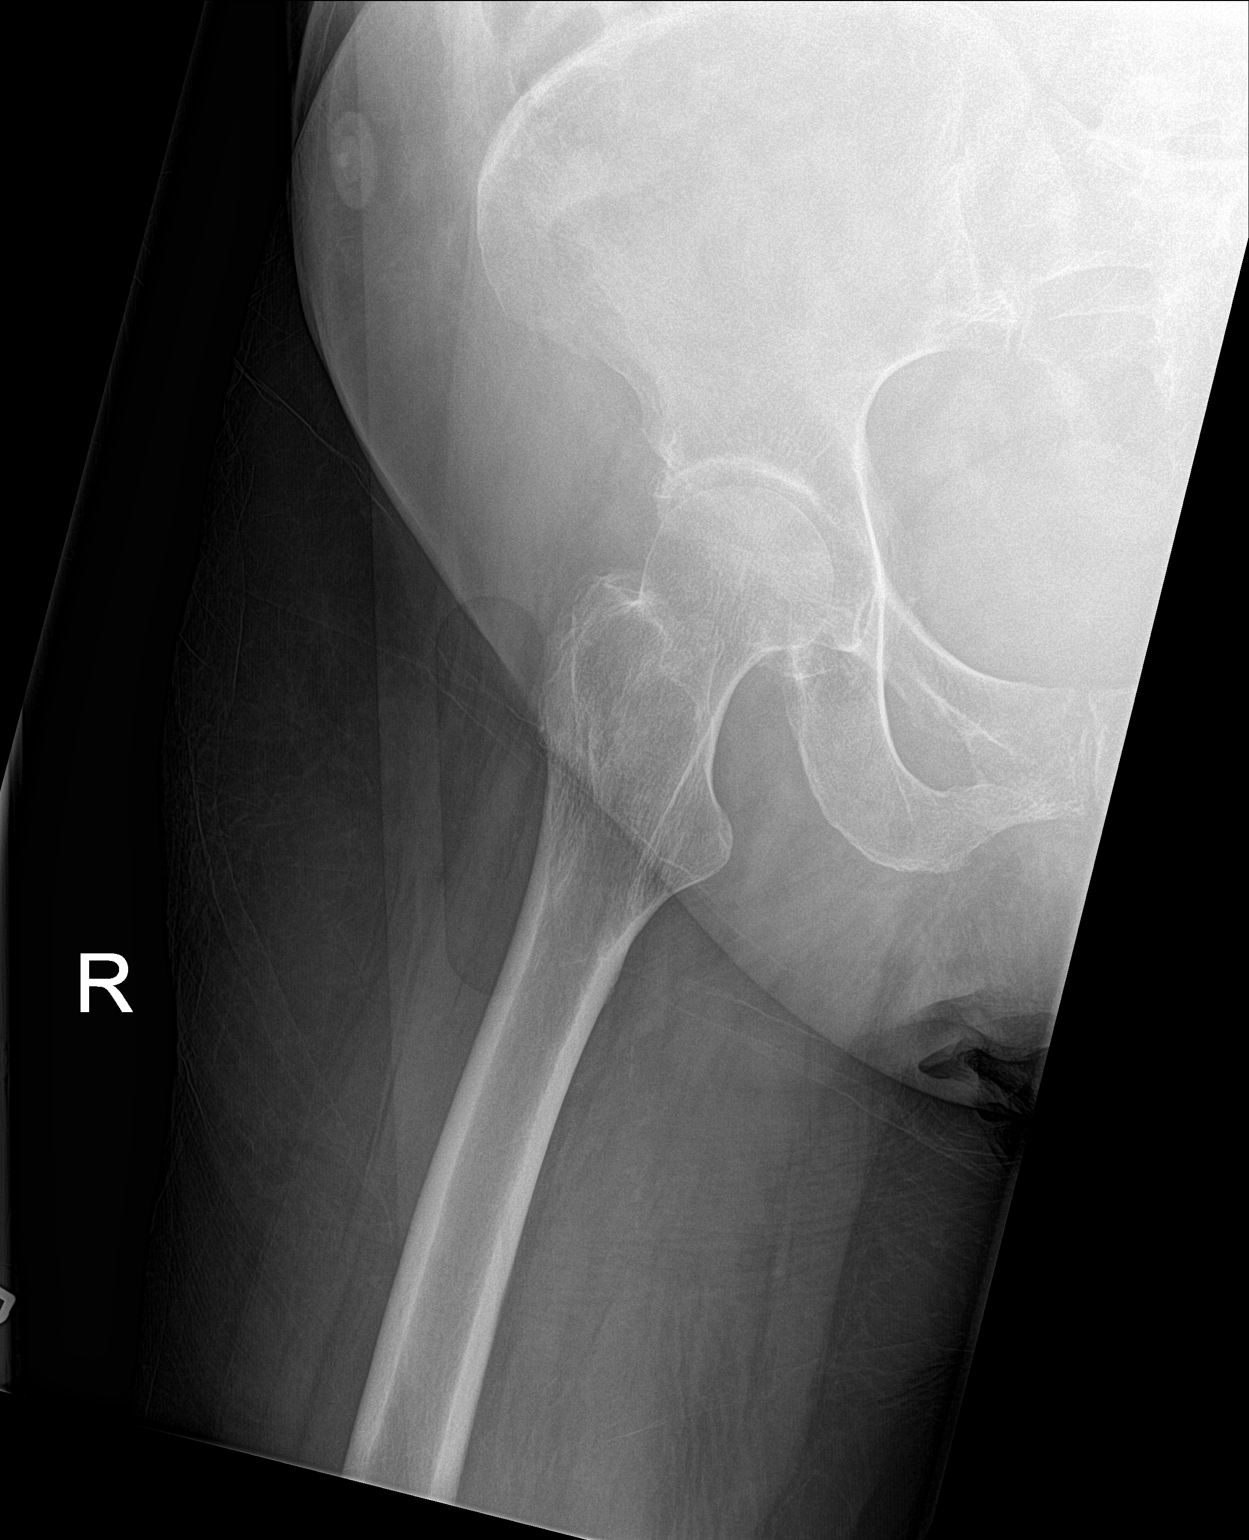

[3 of 3 positions shown; findings below may reference images not displayed]

FINDINGS: Bilateral degenerative arthropathy of the hips with spurring and
mild loss of articular space.

Bony demineralization.

No fracture or acute bony findings identified.
IMPRESSION: 1. No fracture or acute bony findings identified.
2. Bony demineralization.
3. Mild degenerative arthropathy of both hips.

## 2016-02-13 DIAGNOSIS — G0430 Acute necrotizing hemorrhagic encephalopathy, unspecified: Secondary | ICD-10-CM | POA: Diagnosis not present

## 2016-02-14 DIAGNOSIS — G0430 Acute necrotizing hemorrhagic encephalopathy, unspecified: Secondary | ICD-10-CM | POA: Diagnosis not present

## 2016-02-14 IMAGING — CR DG CHEST 1V
1 series · 1 of 1 positions shown · non-contrast
Comparison: 11/25/2014

CLINICAL DATA: Left side neck pain, fall yesterday, fall today,
confusion

EXAM:
CHEST  1 VIEW

[t chest supine]
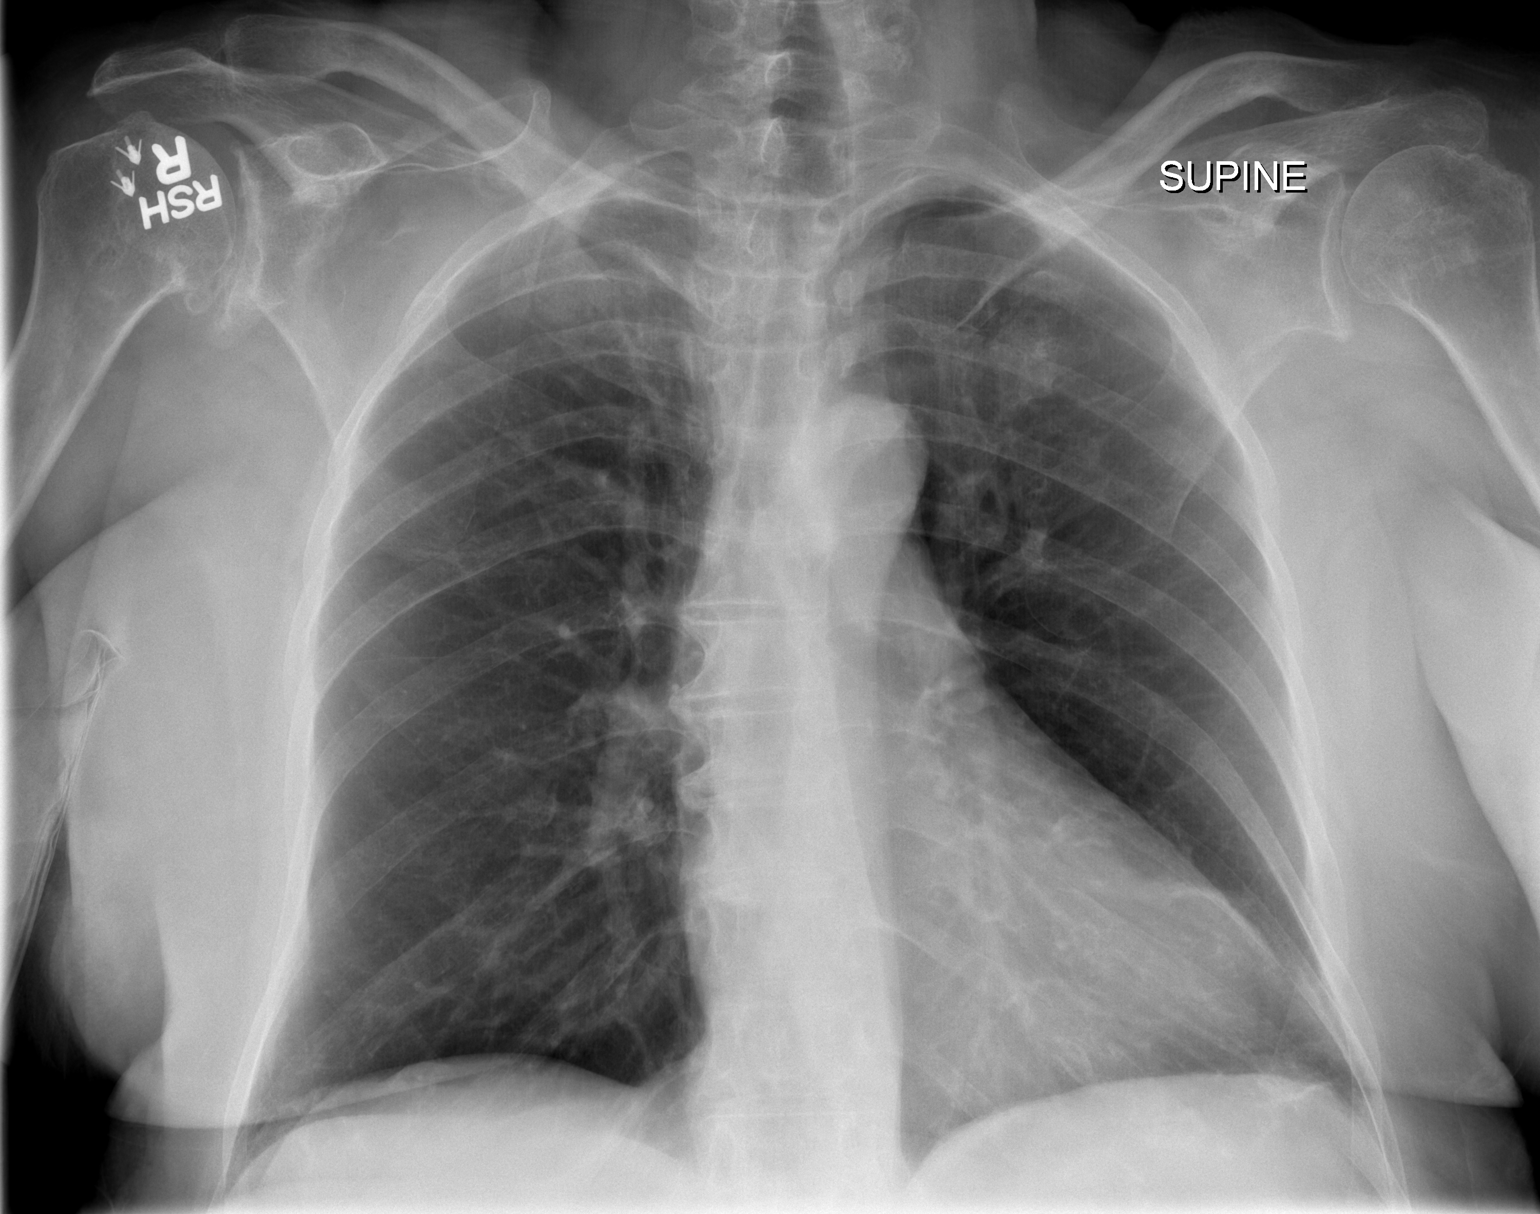

[1 of 1 positions shown; findings below may reference images not displayed]

FINDINGS: Cardiomediastinal silhouette is stable. Degenerative changes right
shoulder again noted. Minimal mid thoracic dextroscoliosis. Stable
degenerative changes thoracic spine. No acute infiltrate or
pulmonary edema. There is no pneumothorax. Mild degenerative changes
left shoulder.
IMPRESSION: No active disease. Mild degenerative changes thoracic spine. Stable
degenerative changes bilateral shoulders. No pneumothorax.

## 2016-02-14 IMAGING — CR DG PELVIS 1-2V
1 series · 1 of 1 positions shown · non-contrast
Comparison: None.

CLINICAL DATA: Initial encounter for fall to ground.  Pain.

EXAM:
PELVIS - 1-2 VIEW

[t pelvis ap]
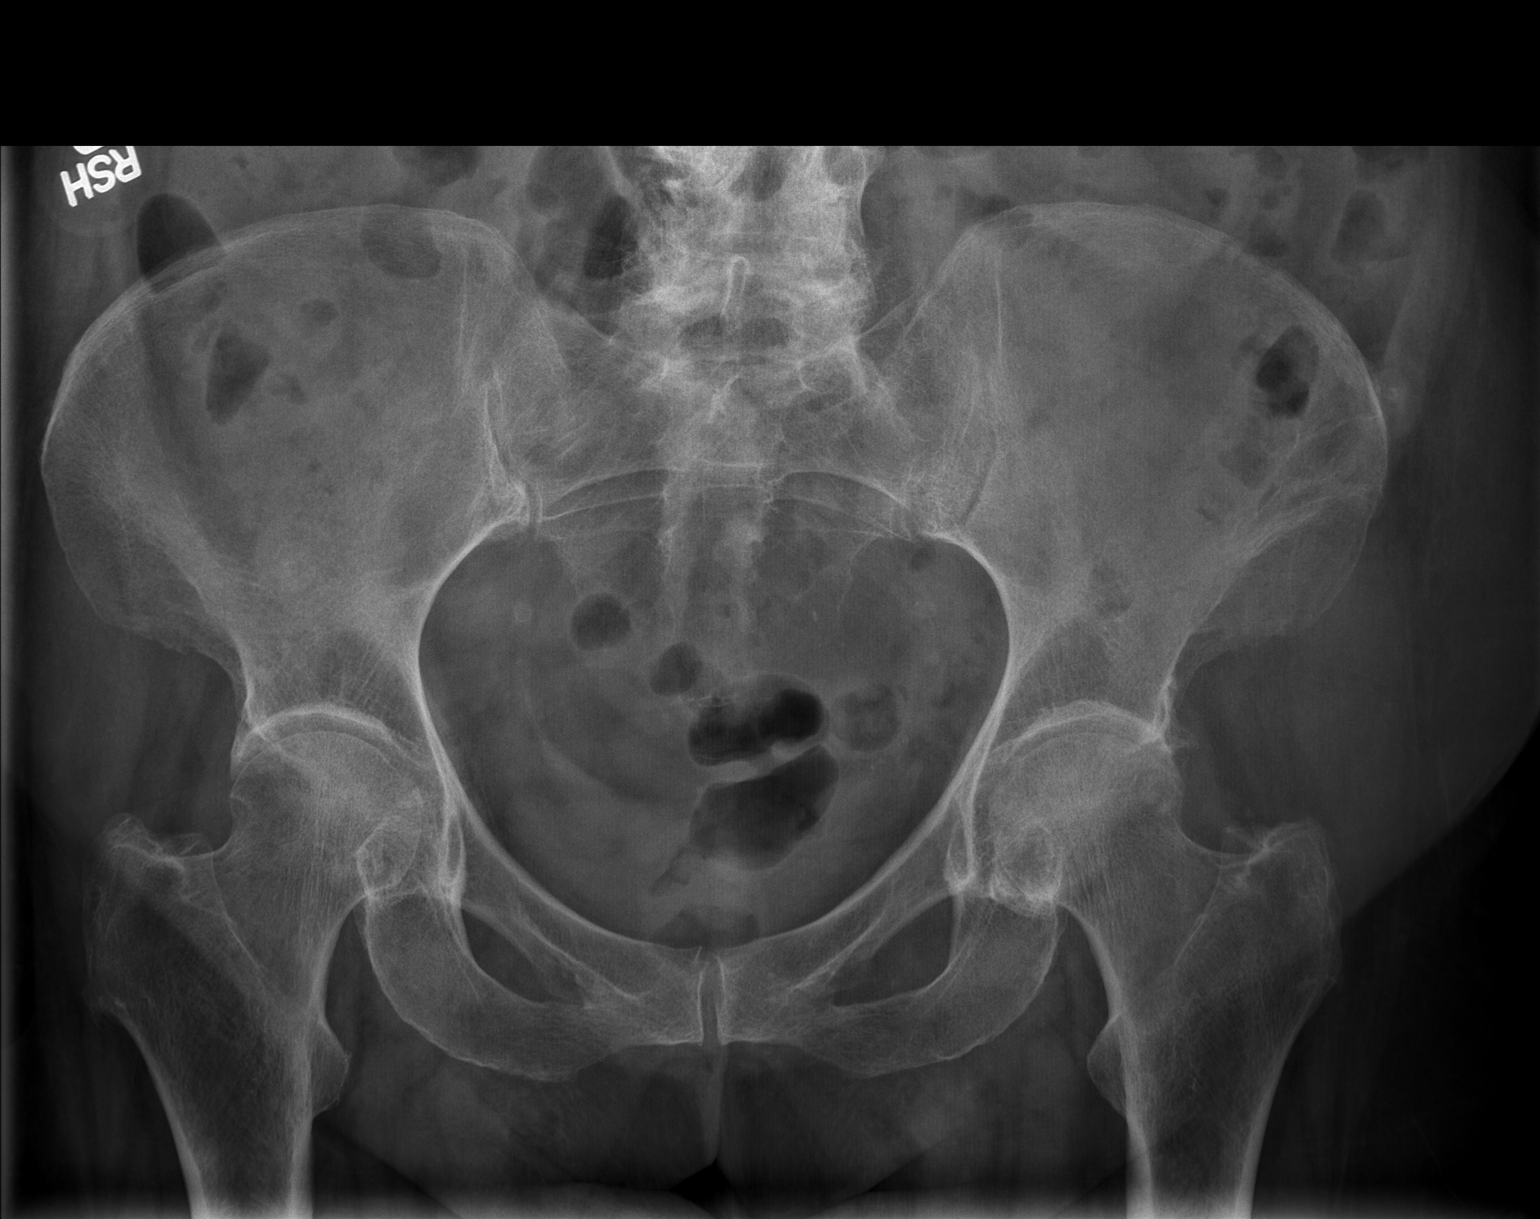

[1 of 1 positions shown; findings below may reference images not displayed]

FINDINGS: There is no evidence of pelvic fracture or diastasis. No pelvic bone
lesions are seen.
IMPRESSION: Negative.

## 2016-02-14 IMAGING — CR DG SHOULDER 2+V*L*
2 series · 2 of 2 positions shown · non-contrast
Comparison: None.

CLINICAL DATA: Fell.  Pain.

EXAM:
LEFT SHOULDER - 2+ VIEW

[t shoulder external left]
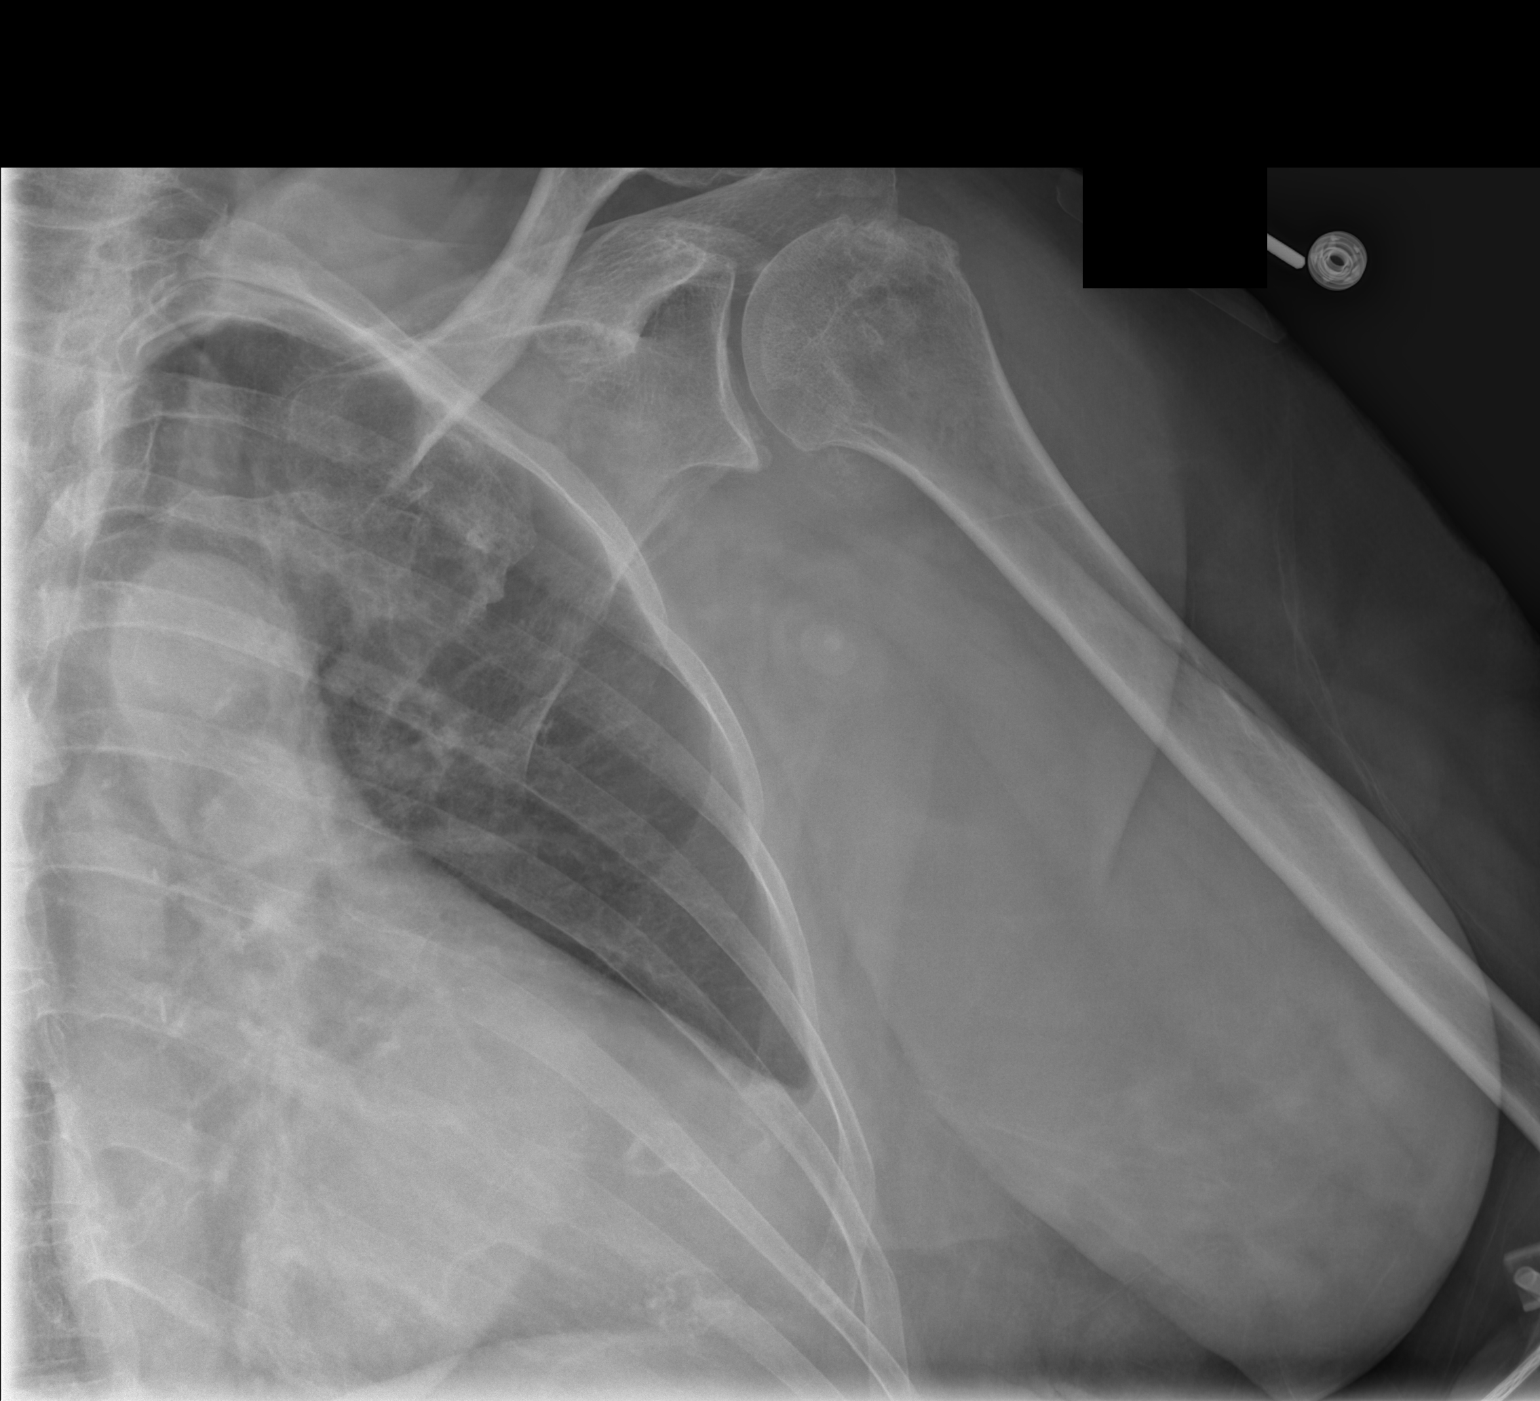

[t shoulder y-view left]
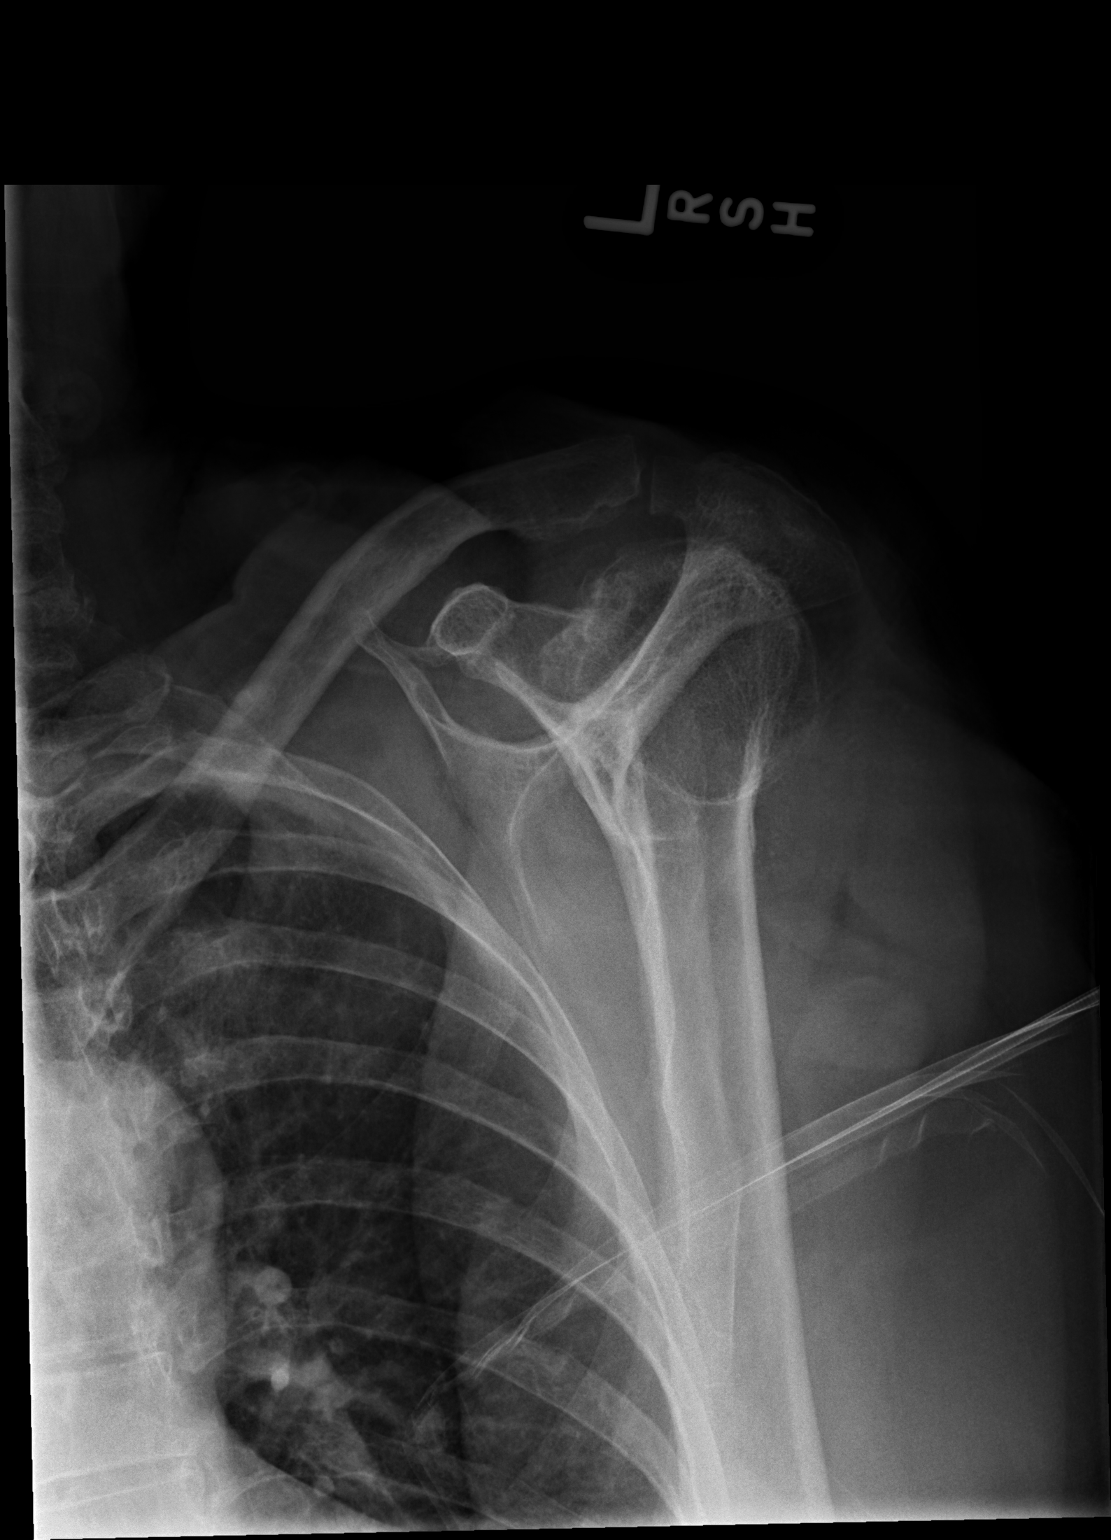

[2 of 2 positions shown; findings below may reference images not displayed]

FINDINGS: There is osteoarthritis of the glenohumeral joint. There probably
small intra-articular loose bodies. There is narrowing of the
humeral acromial distance consistent with rotator cuff disease,
probably chronic. AC joint appears normal.
IMPRESSION: No acute or traumatic finding. Osteoarthritis of the glenohumeral
joint, possibly with small loose bodies. Narrowed humeral acromial
distance that could be associated with rotator cuff tear.

## 2016-02-14 IMAGING — CR DG LUMBAR SPINE COMPLETE 4+V
5 series · 5 of 5 positions shown · non-contrast
Comparison: 07/08/2014

CLINICAL DATA: Recent fall low back pain

EXAM:
LUMBAR SPINE - COMPLETE 4+ VIEW

[t lumbar spine ap]
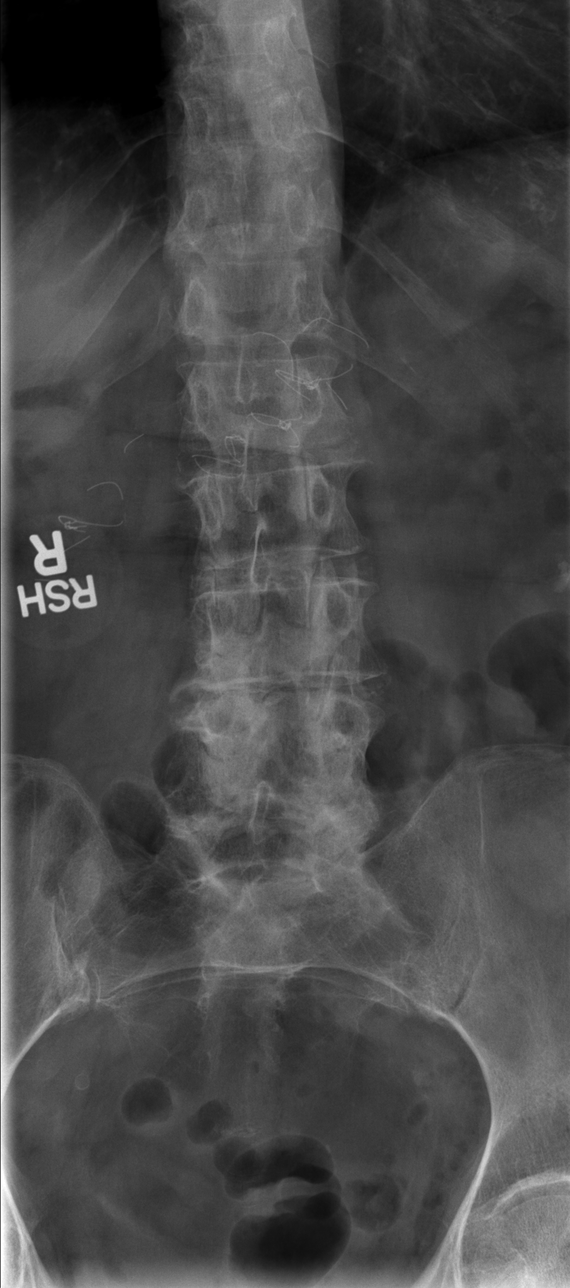

[t lumbar spine obl (1 of 2)]
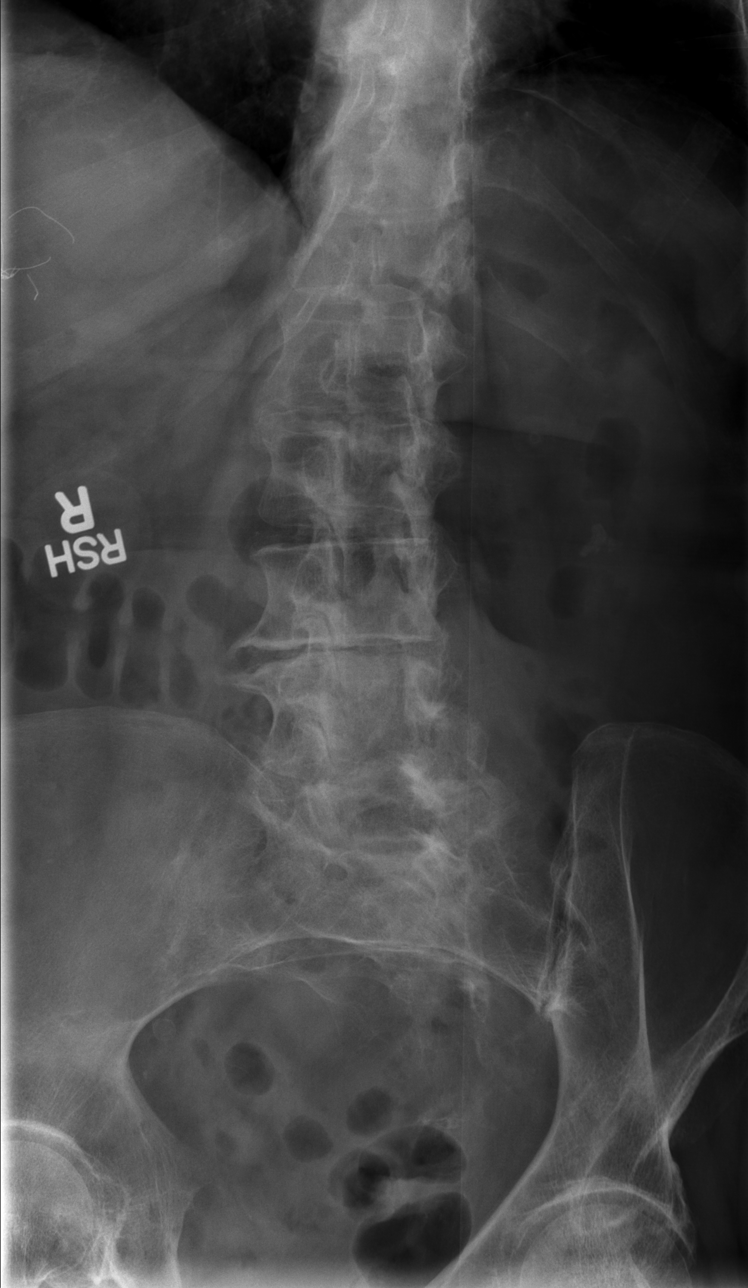

[t lumbar spine obl (2 of 2)]
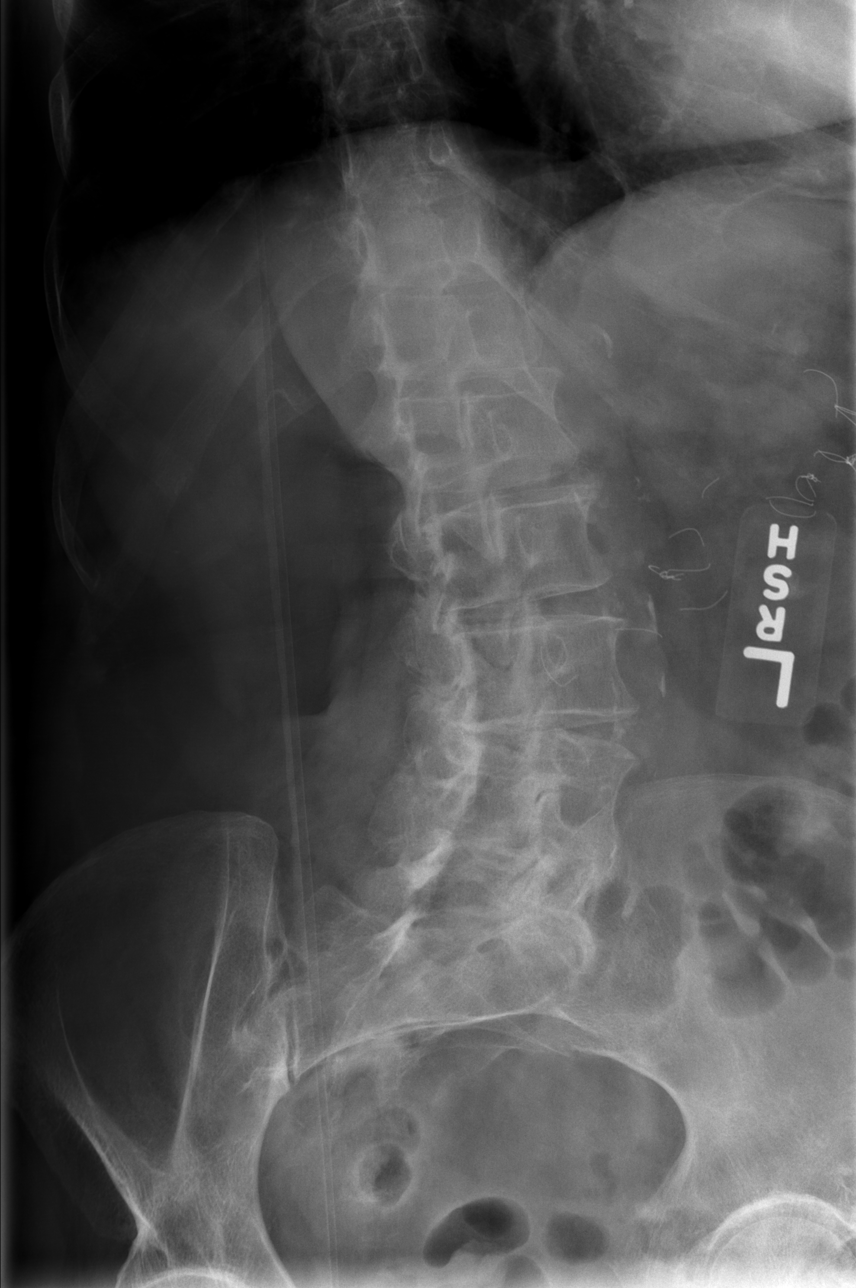

[t lumbar spine lat]
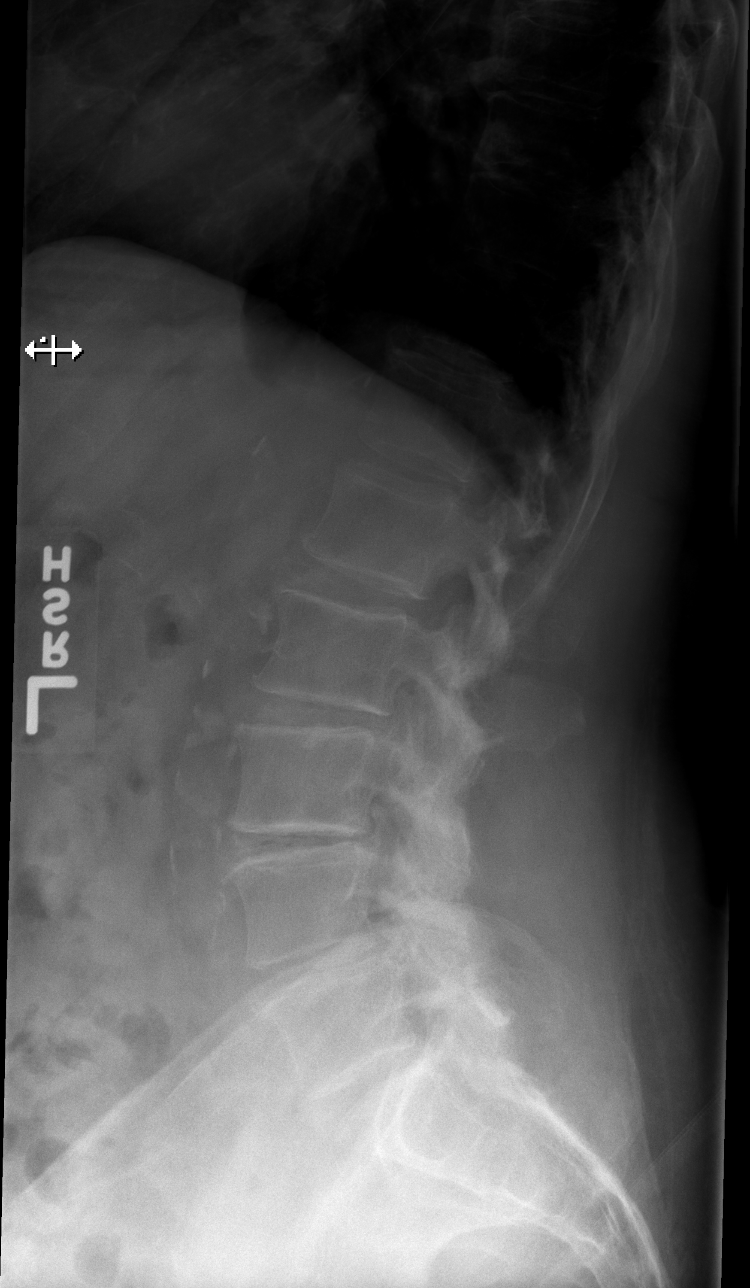

[t lumbar l-5 s-1 spot]
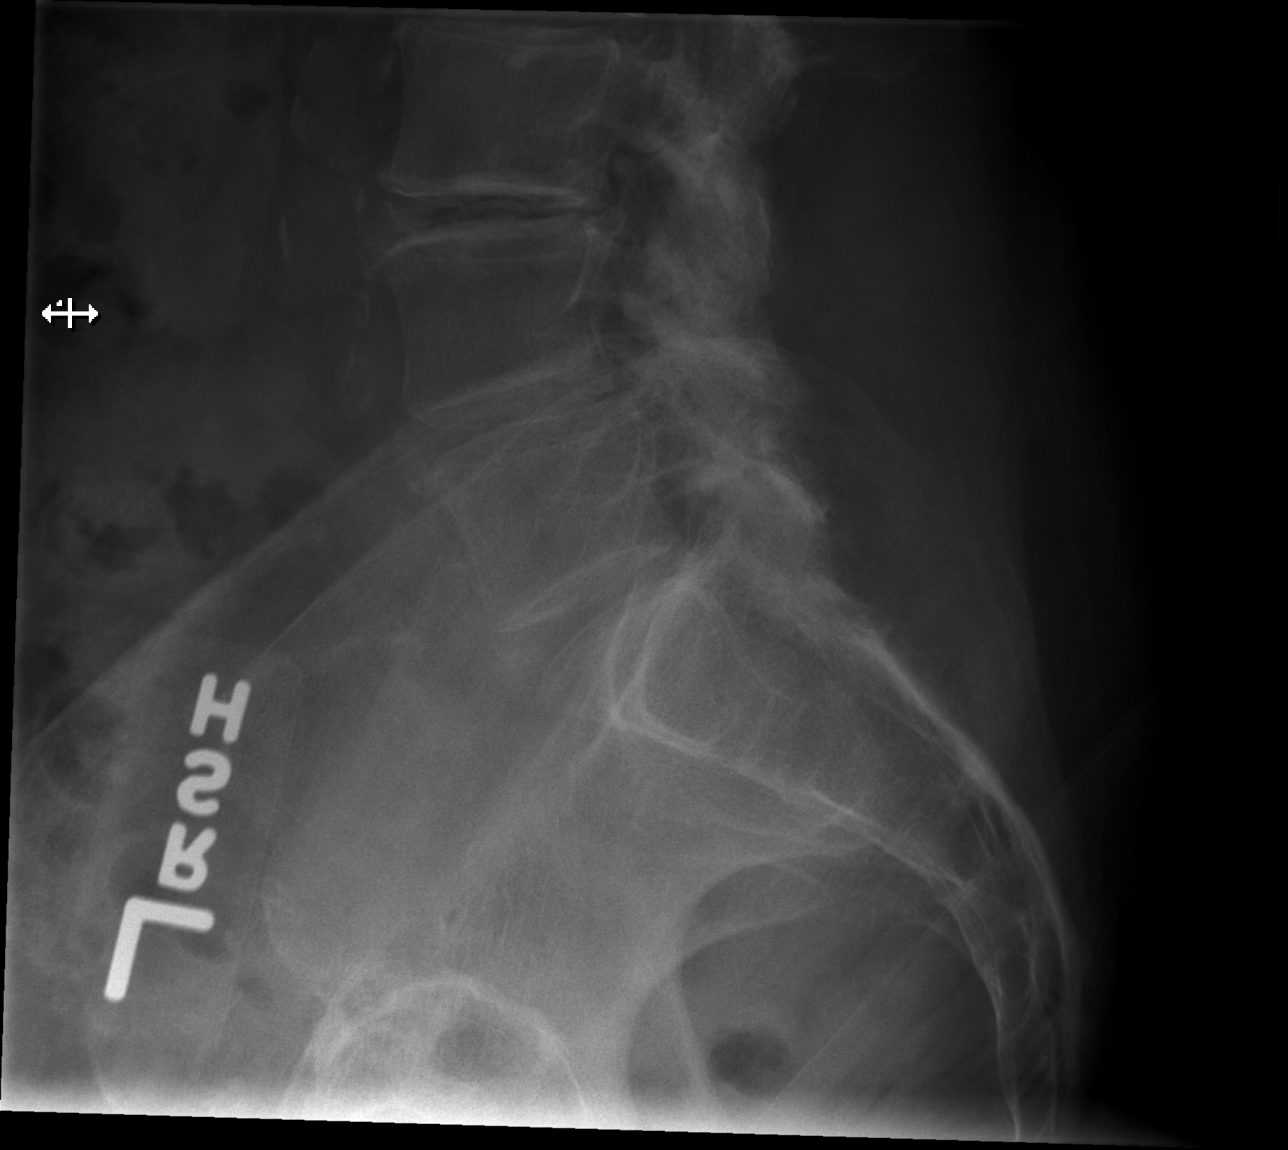

[5 of 5 positions shown; findings below may reference images not displayed]

FINDINGS: Vertebral body height is well maintained mild disc space narrowing
is again noted at L3-4 with vacuum disc phenomena. Mild
anterolisthesis of L4 on L5 is noted of a degenerative nature. No
definitive pars defects are seen mild osteophytic changes are noted.
No soft tissue changes are noted.
IMPRESSION: Degenerative change without acute abnormality.

## 2016-02-14 IMAGING — CT CT CERVICAL SPINE W/O CM
3 of 6 series · 10 of 33 positions shown, 12 images · non-contrast
Comparison: 11/25/2014 and multiple previous

CLINICAL DATA: Left-sided neck pain. Found on the ground. Fell
yesterday. Fell again. Dementia.

EXAM:
CT HEAD WITHOUT CONTRAST
CT CERVICAL SPINE WITHOUT CONTRAST
TECHNIQUE: Multidetector CT imaging of the head and cervical spine was
performed following the standard protocol without intravenous
contrast. Multiplanar CT image reconstructions of the cervical spine
were also generated.

[Series 304: cor · coronal · 0.35mm/px · 3 of 49 slices shown]
[im 10/49  bone]
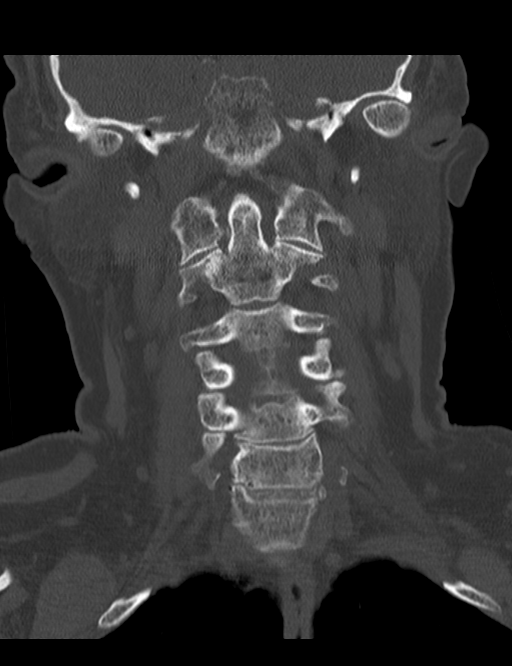
[im 20/49  bone]
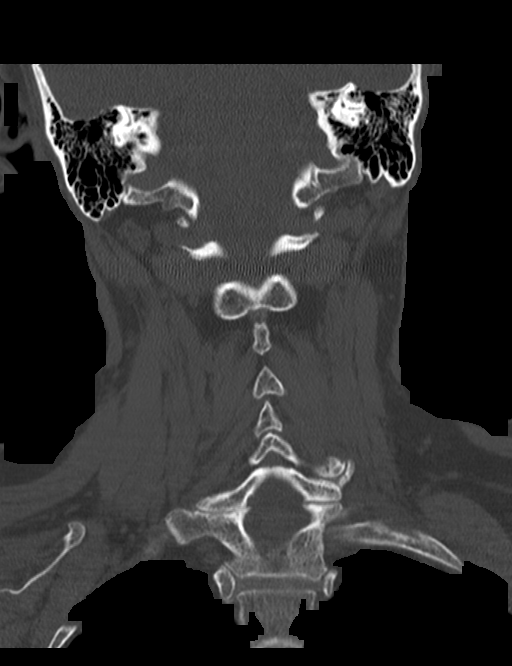
[im 29/49  bone]
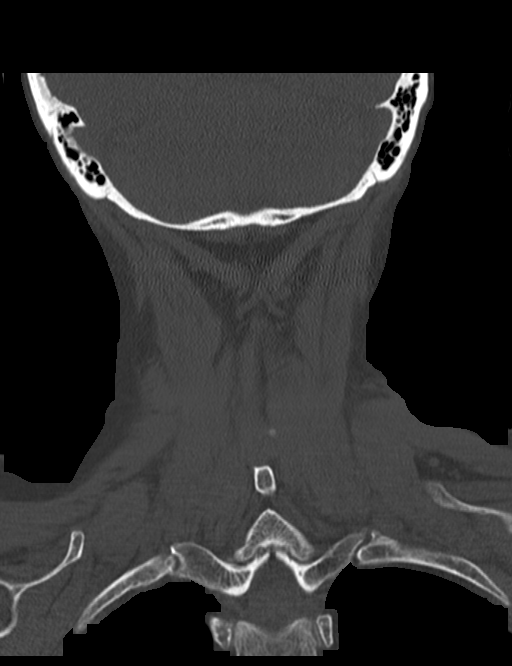

[Series 305: sag · sagittal · 0.35mm/px · 5 of 57 slices shown, 6 images]
[im 19/57  bone]
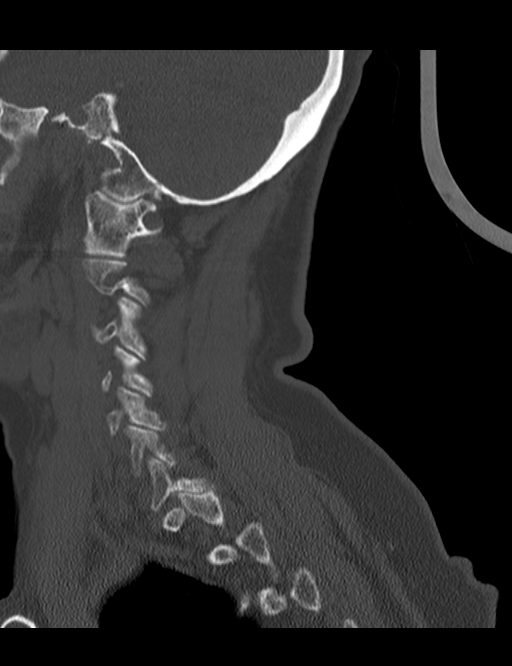
[im 24/57  bone]
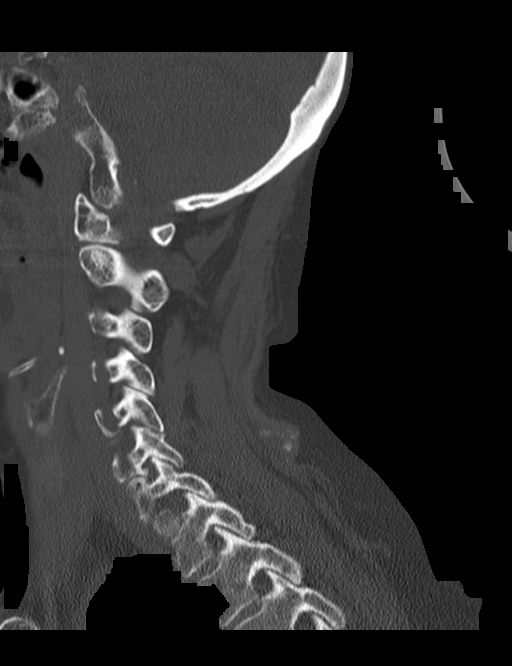
[im 29/57  soft-tissue]
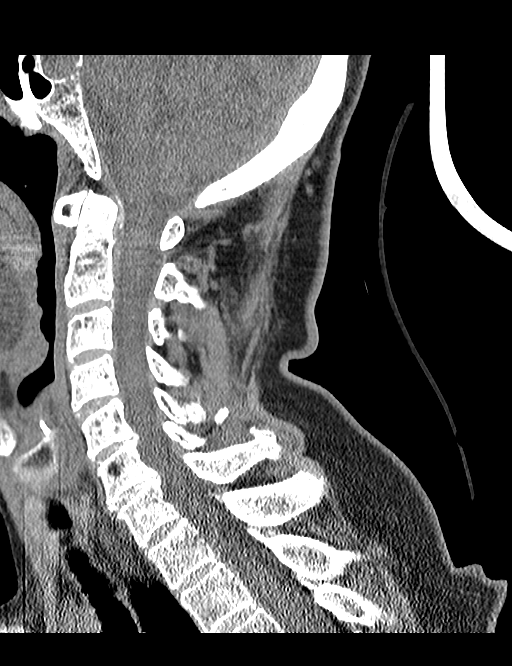
[im 29/57  bone]
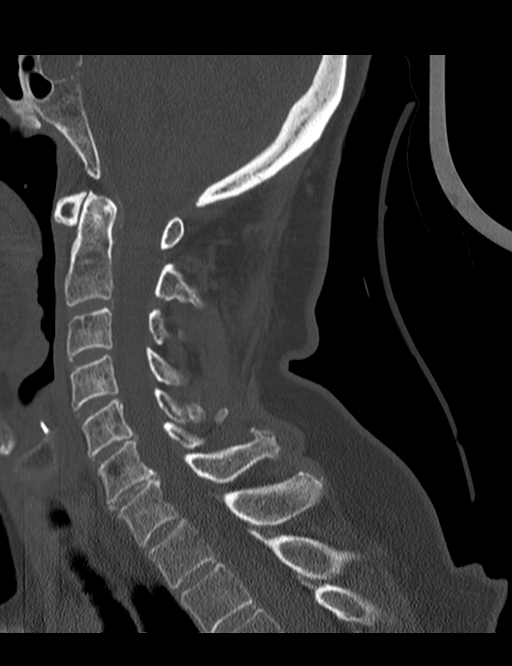
[im 33/57  bone]
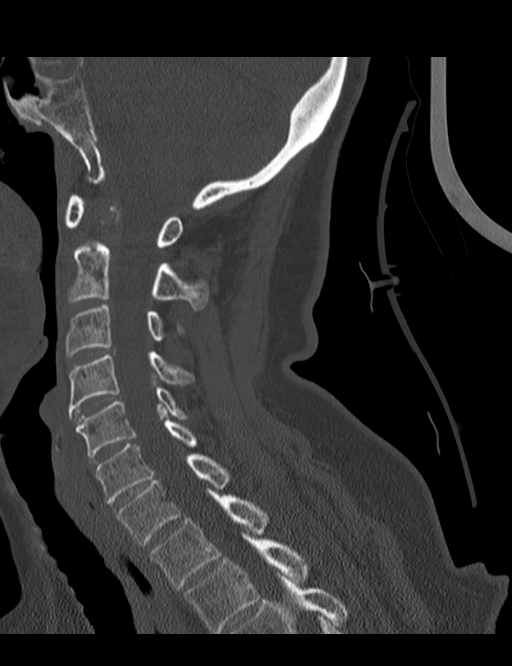
[im 38/57  bone]
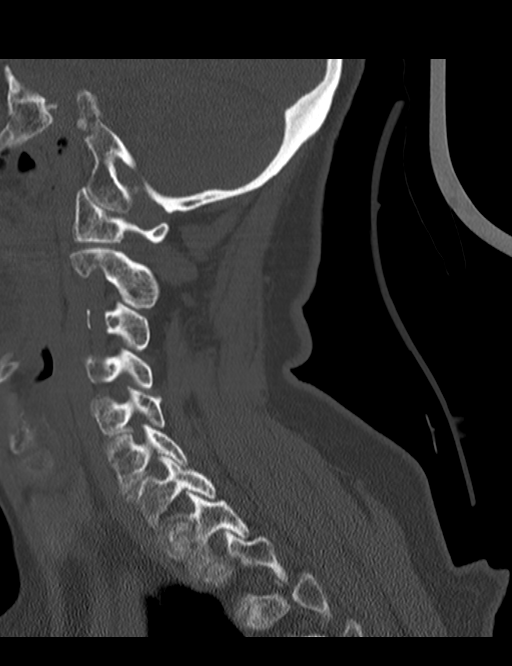

[Series 307: orthogonals · axial · 0.35mm/px · z∈[+90,+146]mm · 2 of 85 slices shown, 3 images]
[im 29/85  soft-tissue]
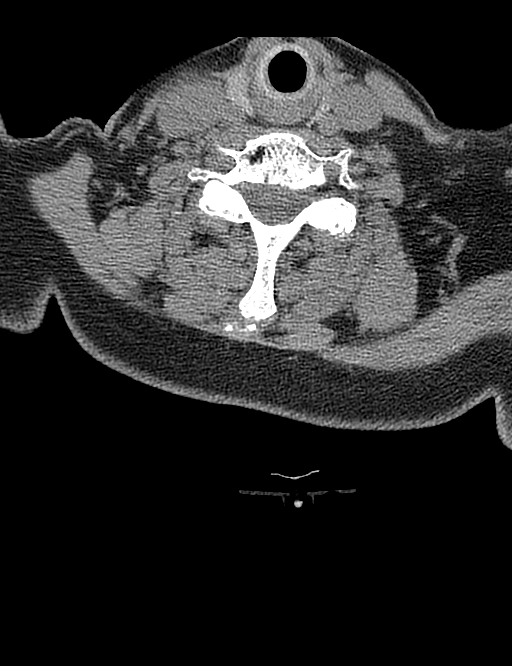
[im 29/85  bone]
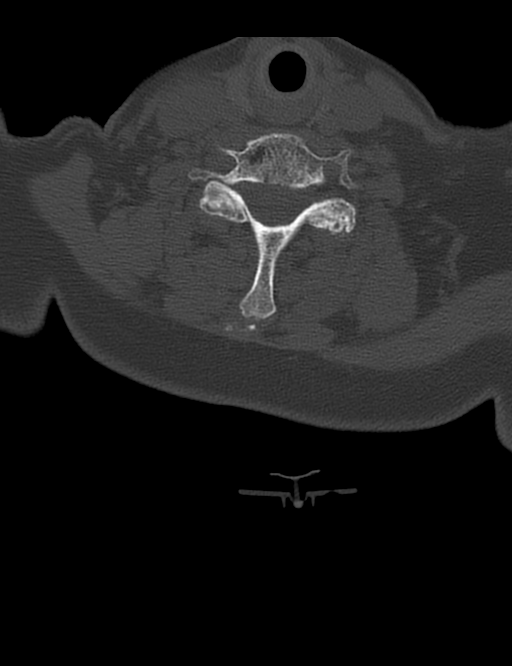
[im 57/85  bone]
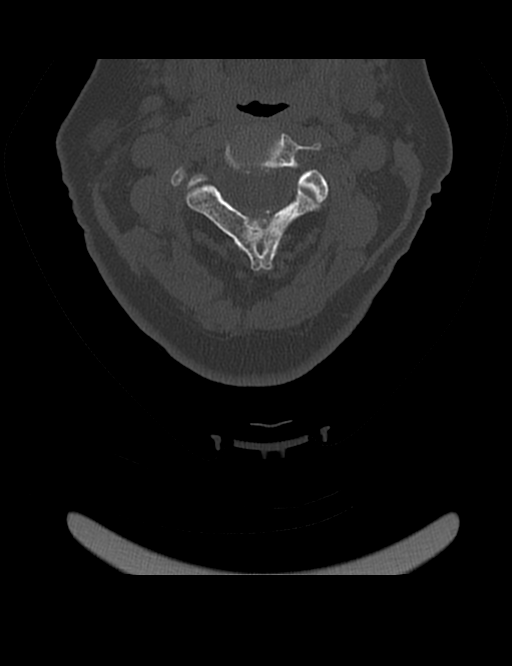

[10 of 33 positions shown; findings below may reference images not displayed]

FINDINGS: CT HEAD FINDINGS

Generalized brain atrophy. Mild chronic small-vessel ischemic
changes affecting the deep white matter, basal ganglia and thalami.
No sign of acute infarction, mass lesion, hemorrhage, hydrocephalus
or extra-axial collection. No skull fracture. No fluid in the
sinuses, middle ears or mastoids.

CT CERVICAL SPINE FINDINGS

Alignment is normal. No soft tissue swelling. No fracture. Ordinary
mild spondylosis at C3-4, C4-5, C5-6 and C6-7. Mild facet
degeneration.
IMPRESSION: Head CT: Atrophy an old small vessel ischemic changes. No acute
finding.

Cervical spine CT: Chronic degenerative changes. No acute or
traumatic finding.

## 2016-02-14 IMAGING — CR DG THORACIC SPINE 2V
3 series · 3 of 3 positions shown · non-contrast
Comparison: 11/27/2014

CLINICAL DATA: Fell yesterday, found on ground.  Fell again today.

EXAM:
THORACIC SPINE - 2 VIEW

[t thoracic spine ap]
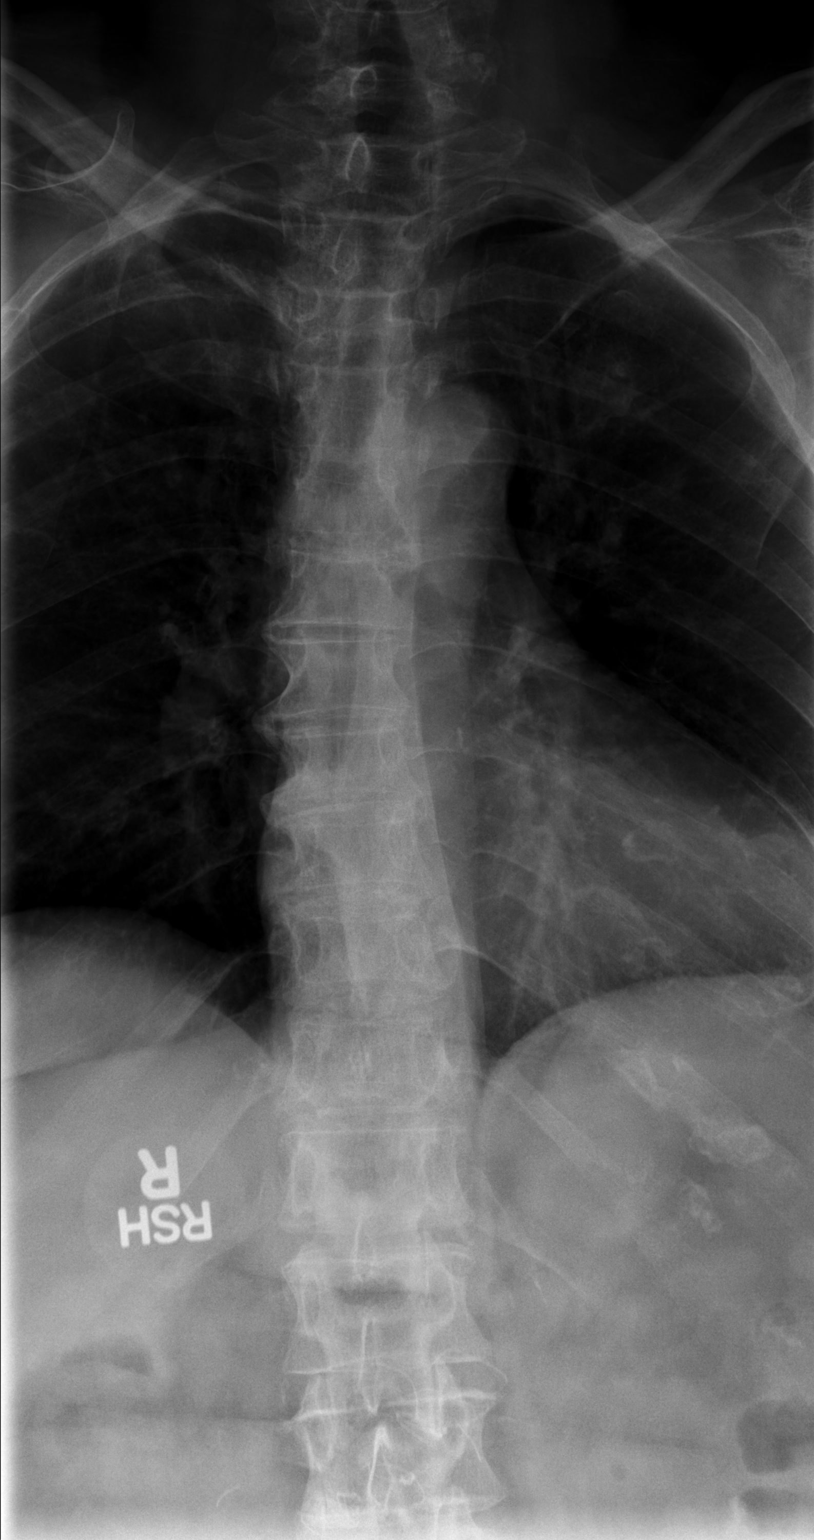

[t thoracic spine lat]
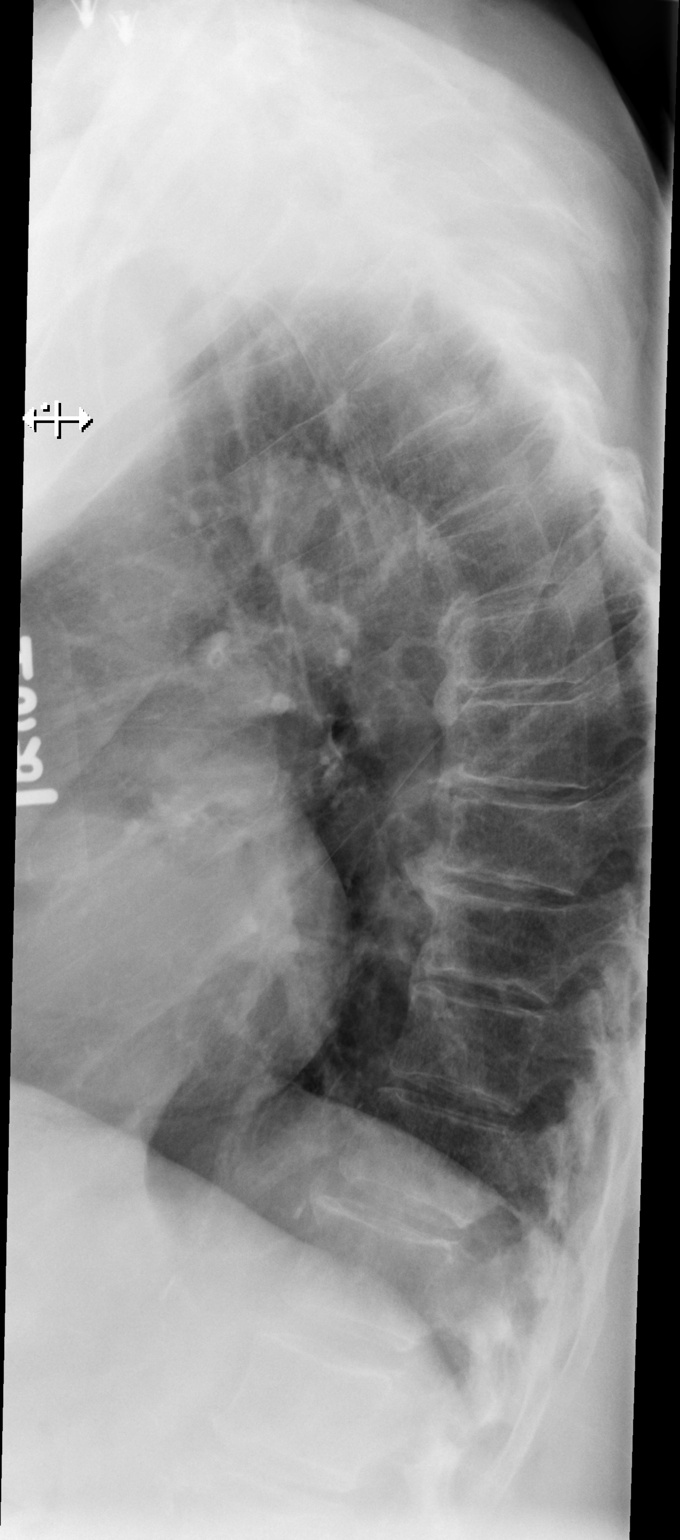

[t thoracic swimmers]
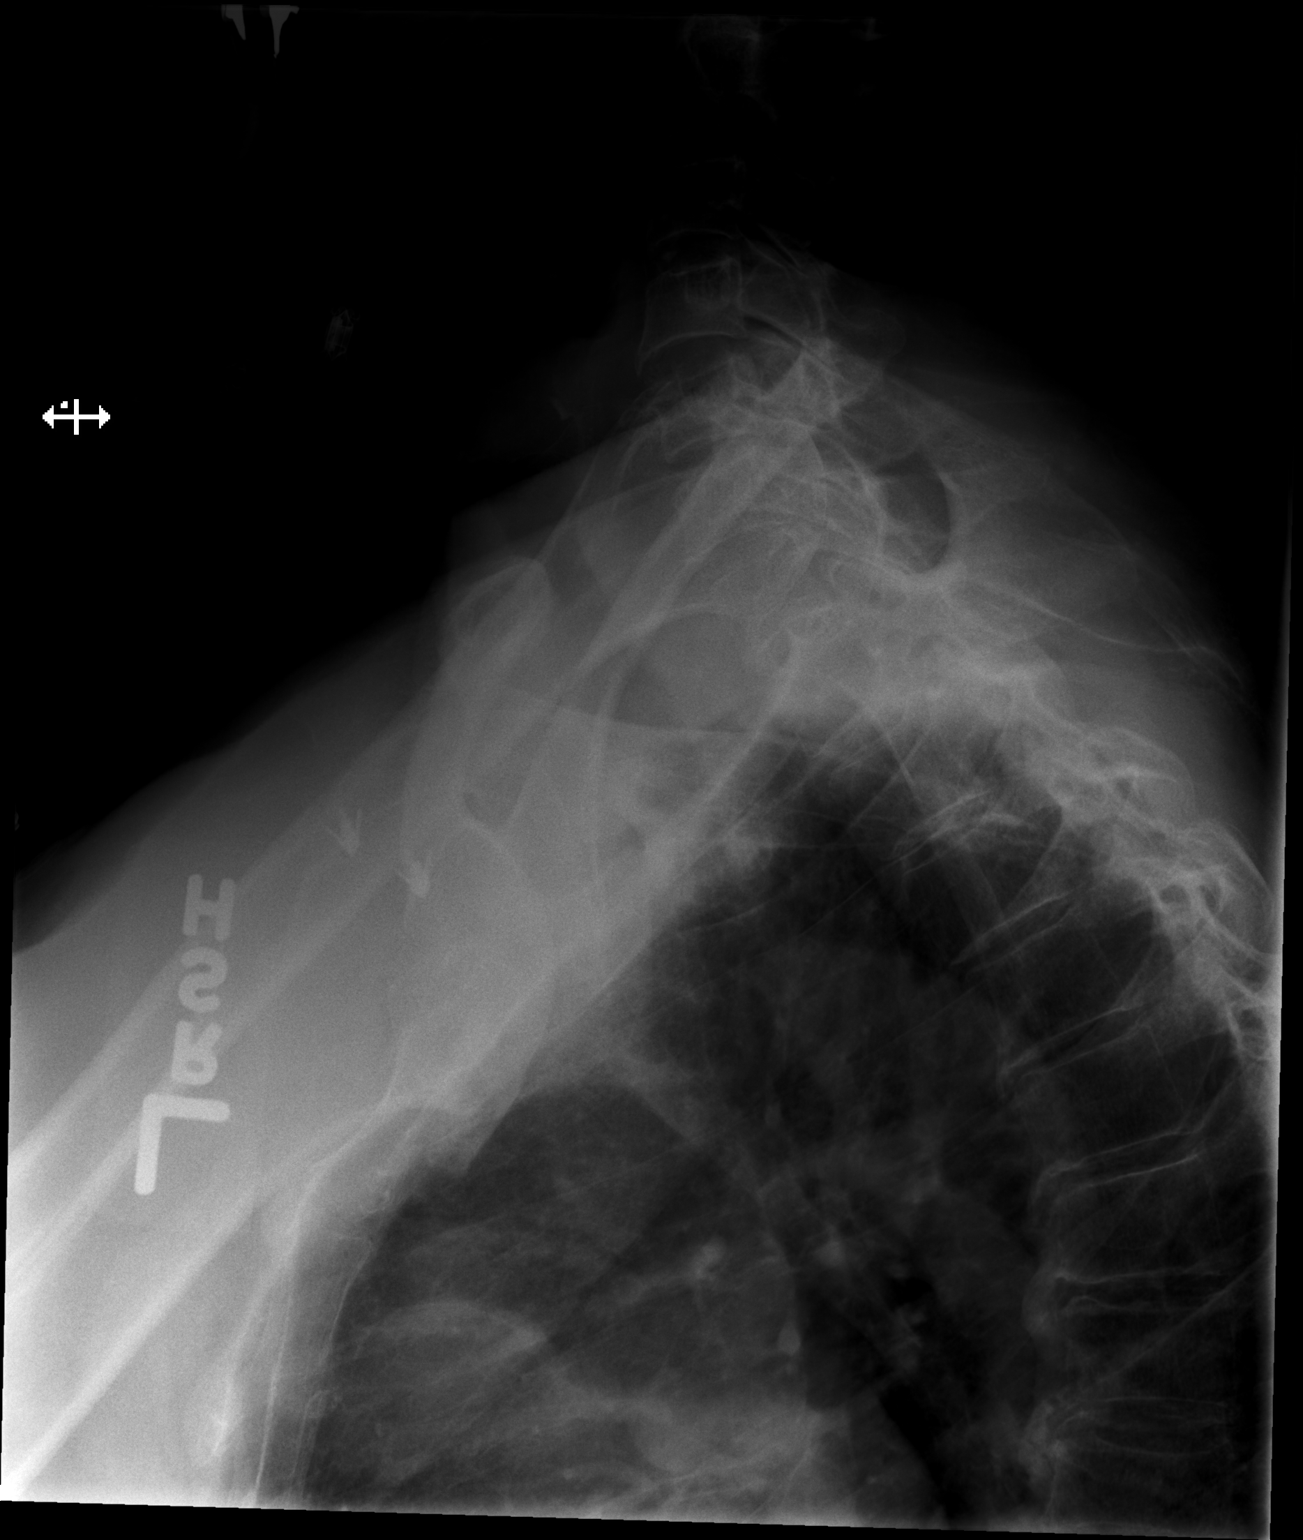

[3 of 3 positions shown; findings below may reference images not displayed]

FINDINGS: there is mild curvature in degenerative change in the thoracic
region. No evidence of thoracic fracture. Posterior medial ribs
appear normal.
IMPRESSION: No acute or traumatic finding. Mild curvature and degenerative
change.

## 2016-02-14 IMAGING — CR DG HUMERUS 2V *L*
2 series · 2 of 2 positions shown · non-contrast
Comparison: None

CLINICAL DATA: LEFT side neck pain, found on ground, fell yesterday
and fell again today striking head, confusion

EXAM:
LEFT HUMERUS - 2+ VIEW

[t humerus ap left]
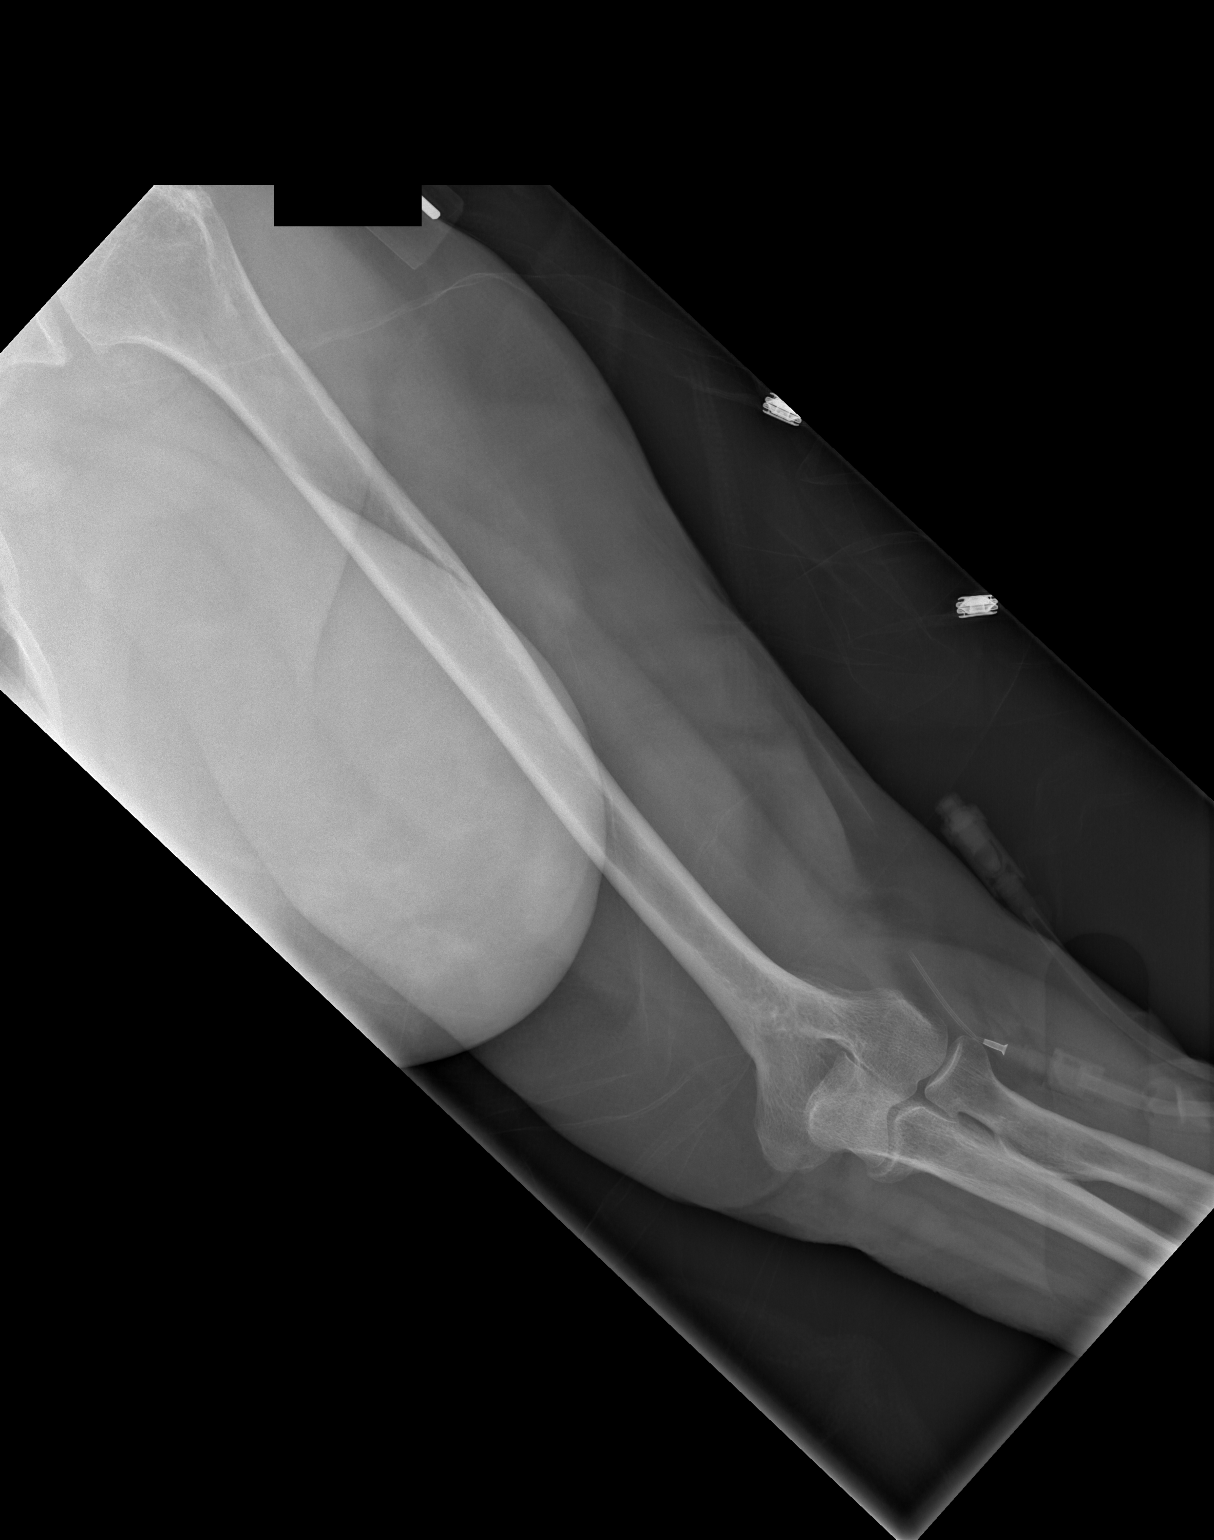

[t humerus lat left]
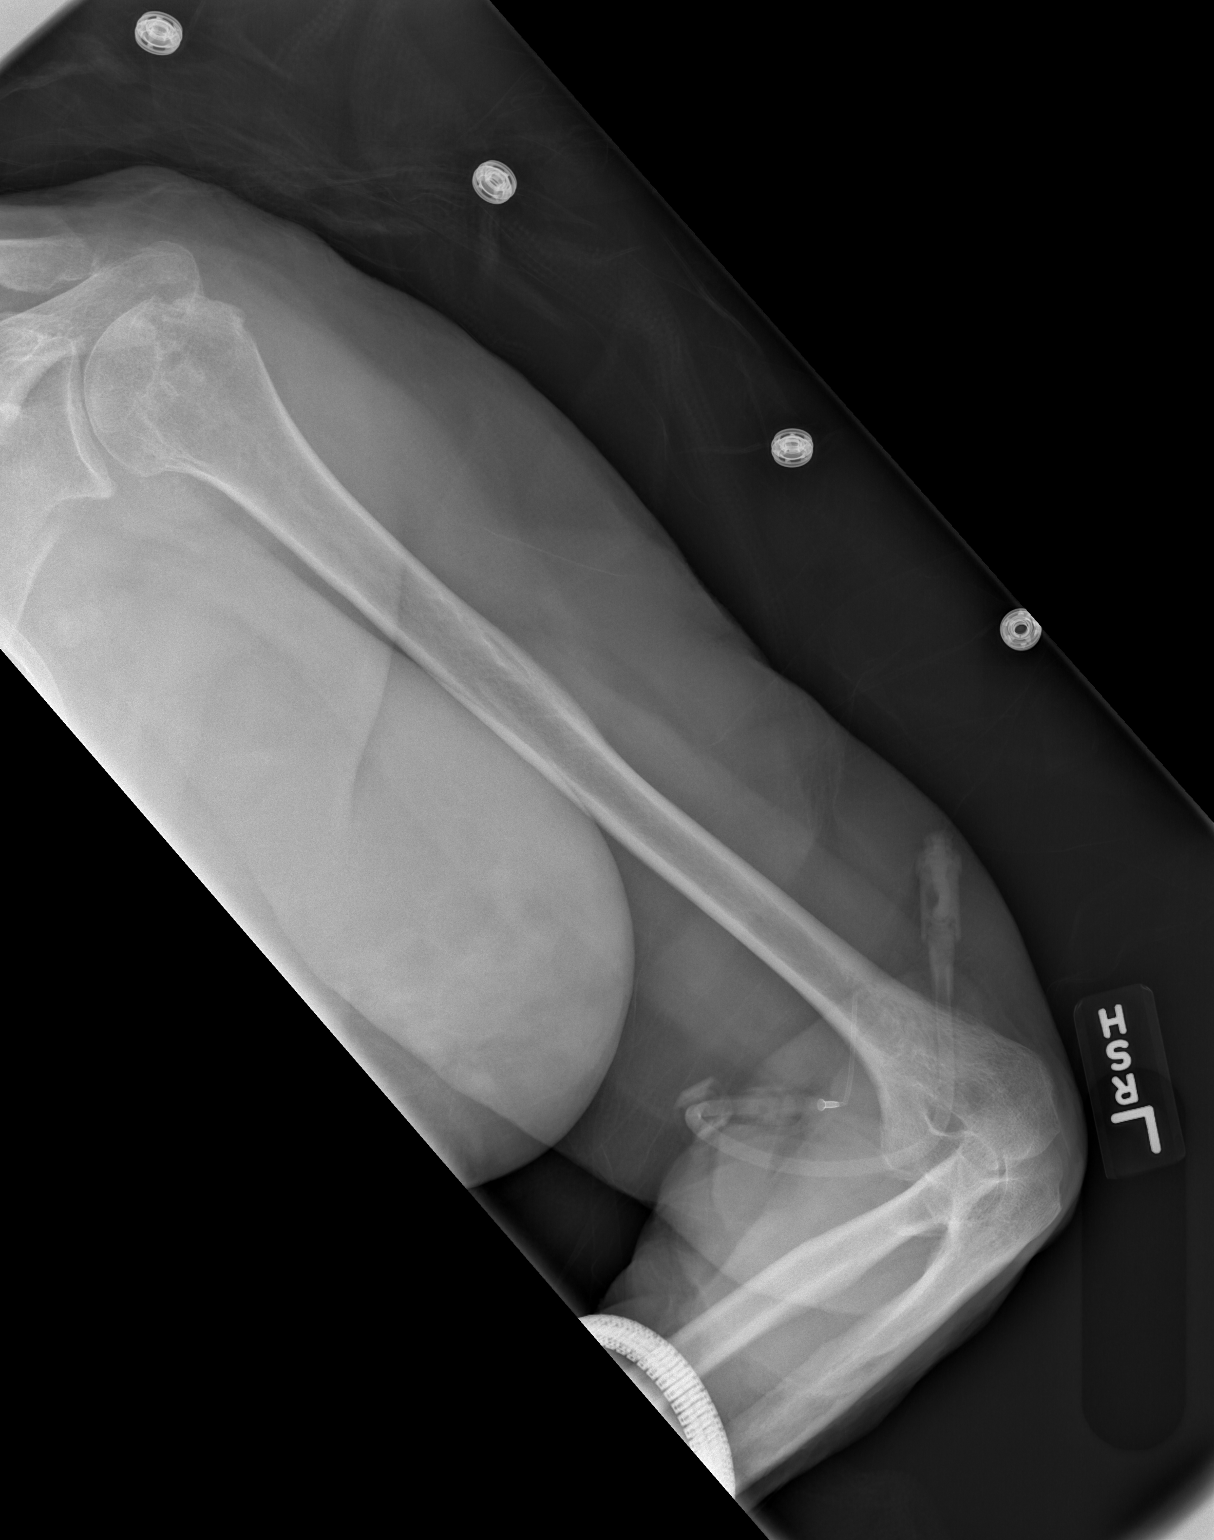

[2 of 2 positions shown; findings below may reference images not displayed]

FINDINGS: Elbow joint alignment normal.

Shoulder imaged separately.

Bones demineralized.

No acute fracture, dislocation, or bone destruction.
IMPRESSION: No acute abnormalities.

## 2016-02-15 DIAGNOSIS — G0430 Acute necrotizing hemorrhagic encephalopathy, unspecified: Secondary | ICD-10-CM | POA: Diagnosis not present

## 2016-02-16 DIAGNOSIS — G0430 Acute necrotizing hemorrhagic encephalopathy, unspecified: Secondary | ICD-10-CM | POA: Diagnosis not present

## 2016-02-17 DIAGNOSIS — G0430 Acute necrotizing hemorrhagic encephalopathy, unspecified: Secondary | ICD-10-CM | POA: Diagnosis not present

## 2016-02-18 DIAGNOSIS — G0430 Acute necrotizing hemorrhagic encephalopathy, unspecified: Secondary | ICD-10-CM | POA: Diagnosis not present

## 2016-02-19 DIAGNOSIS — G0430 Acute necrotizing hemorrhagic encephalopathy, unspecified: Secondary | ICD-10-CM | POA: Diagnosis not present

## 2016-02-19 DIAGNOSIS — F0281 Dementia in other diseases classified elsewhere with behavioral disturbance: Secondary | ICD-10-CM | POA: Diagnosis not present

## 2016-02-19 DIAGNOSIS — F411 Generalized anxiety disorder: Secondary | ICD-10-CM | POA: Diagnosis not present

## 2016-02-20 DIAGNOSIS — G0430 Acute necrotizing hemorrhagic encephalopathy, unspecified: Secondary | ICD-10-CM | POA: Diagnosis not present

## 2016-02-21 DIAGNOSIS — G0430 Acute necrotizing hemorrhagic encephalopathy, unspecified: Secondary | ICD-10-CM | POA: Diagnosis not present

## 2016-02-22 DIAGNOSIS — G0430 Acute necrotizing hemorrhagic encephalopathy, unspecified: Secondary | ICD-10-CM | POA: Diagnosis not present

## 2016-02-22 IMAGING — CT CT HEAD W/O CM
2 series · 16 of 30 positions shown, 19 images · non-contrast
Comparison: CT of the head performed 11/27/2014

CLINICAL DATA: Multiple recent falls. Concern for head injury.
Initial encounter.

EXAM:
CT HEAD WITHOUT CONTRAST
TECHNIQUE: Contiguous axial images were obtained from the base of the skull
through the vertex without intravenous contrast.

[Series 2: head w/o · axial · non-contrast · 0.45mm/px · z∈[-119,+1]mm · 9 of 31 slices shown, 12 images]
[im 4/31  brain]
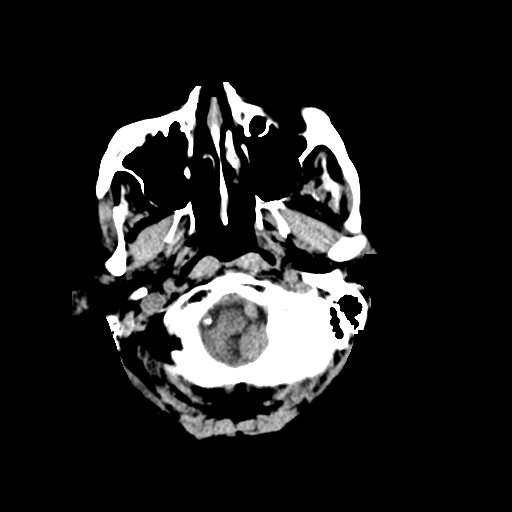
[im 4/31  bone]
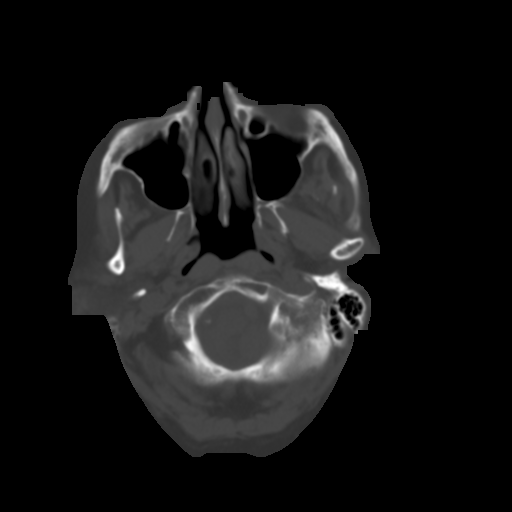
[im 7/31  brain]
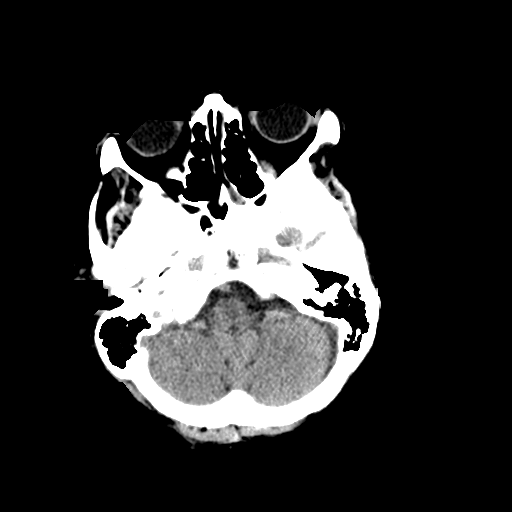
[im 10/31  brain]
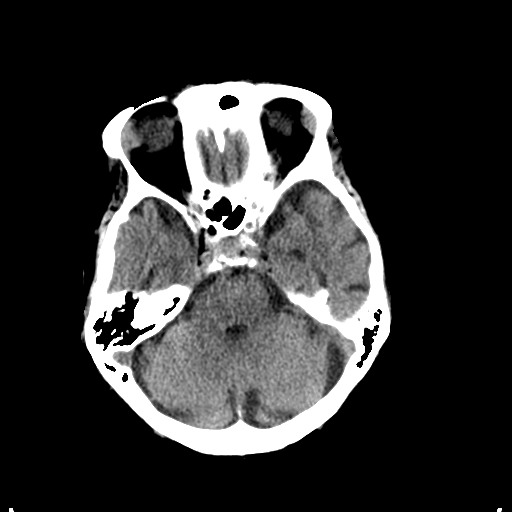
[im 13/31  brain]
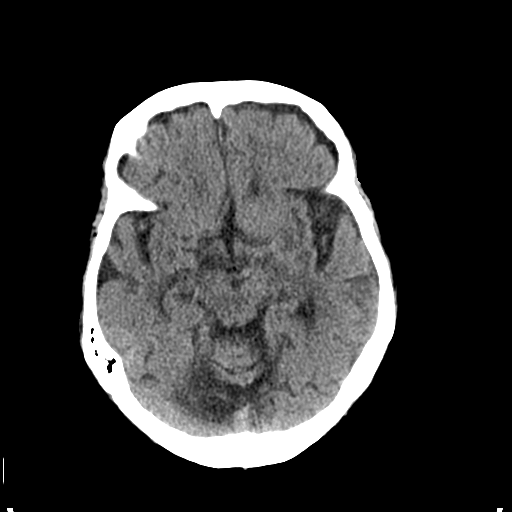
[im 16/31  brain]
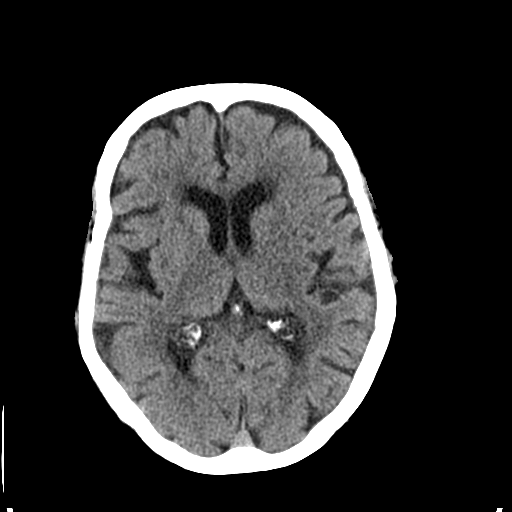
[im 16/31  bone]
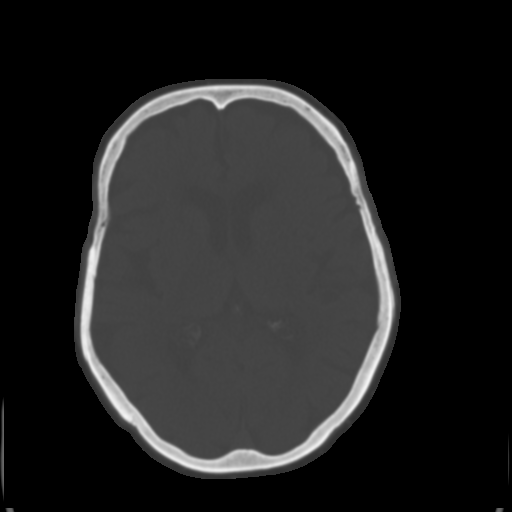
[im 19/31  brain]
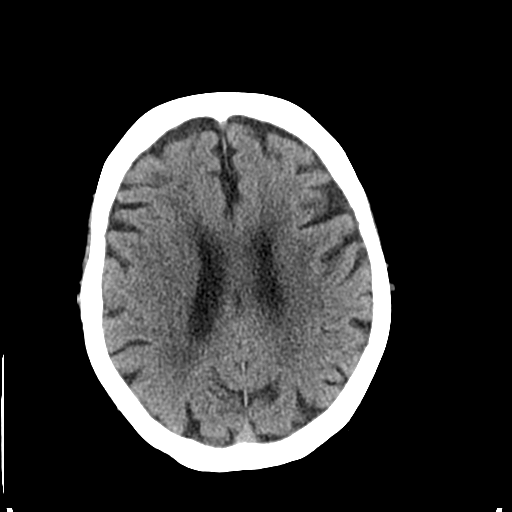
[im 22/31  brain]
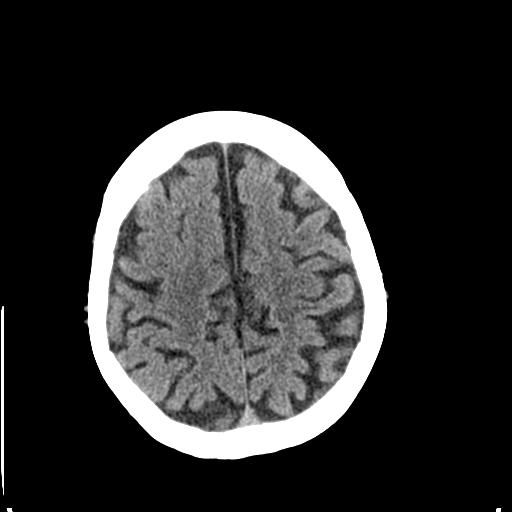
[im 25/31  brain]
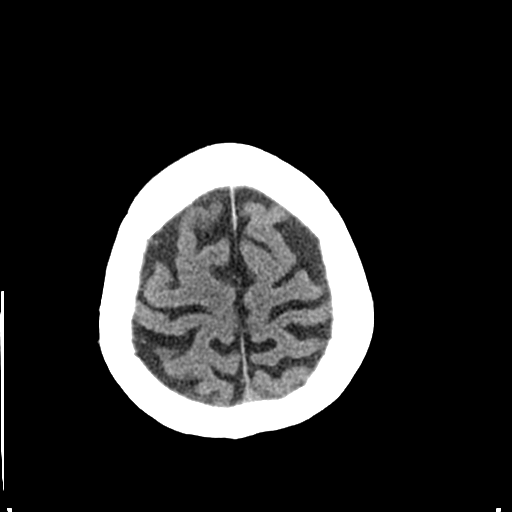
[im 28/31  brain]
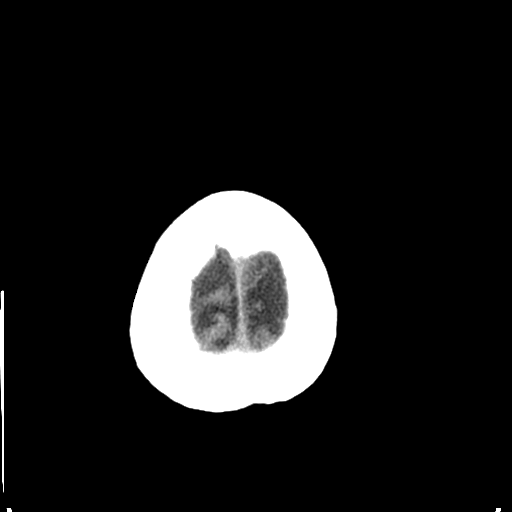
[im 28/31  bone]
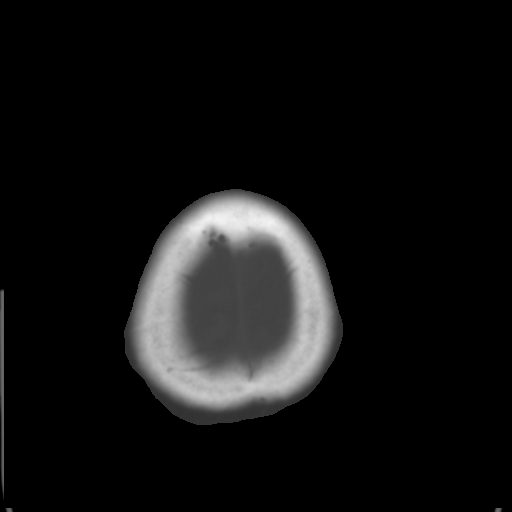

[Series 3: bone windows · axial · 0.45mm/px · z∈[-119,-17]mm · 7 of 52 slices shown]
[im 6/52  bone]
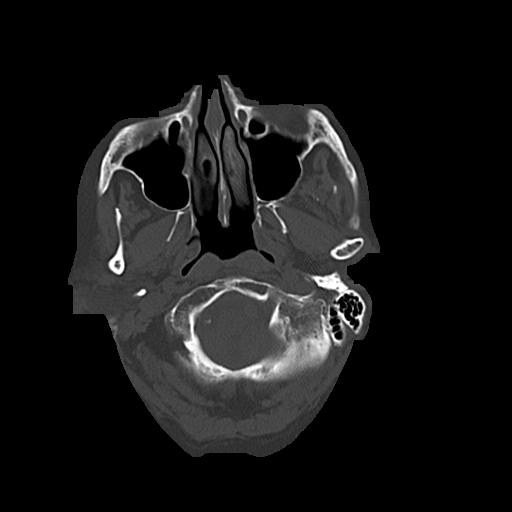
[im 12/52  bone]
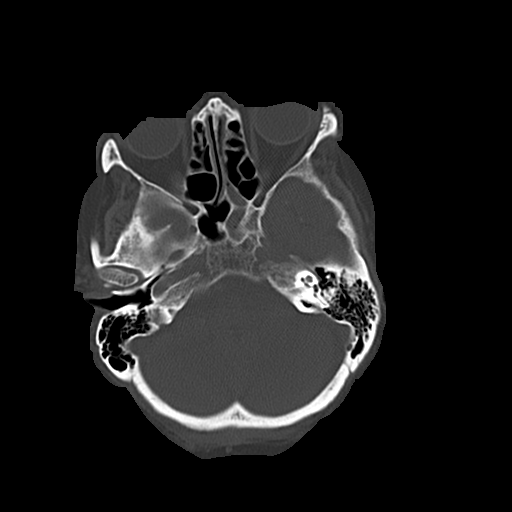
[im 18/52  bone]
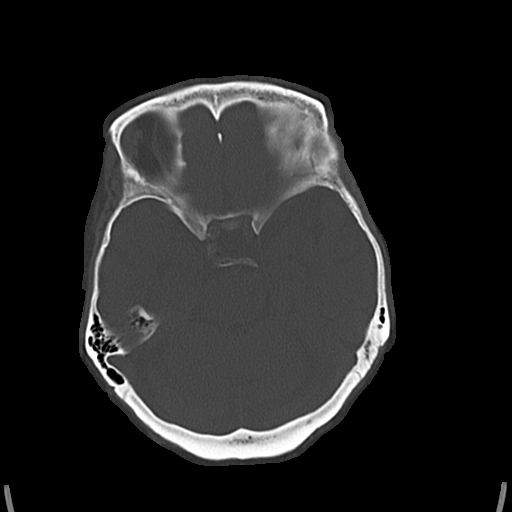
[im 23/52  bone]
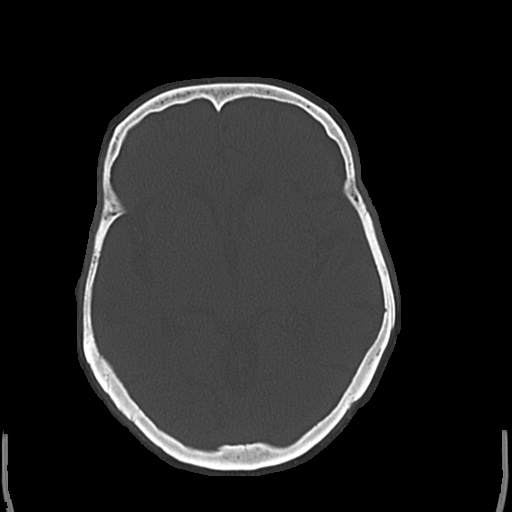
[im 29/52  bone]
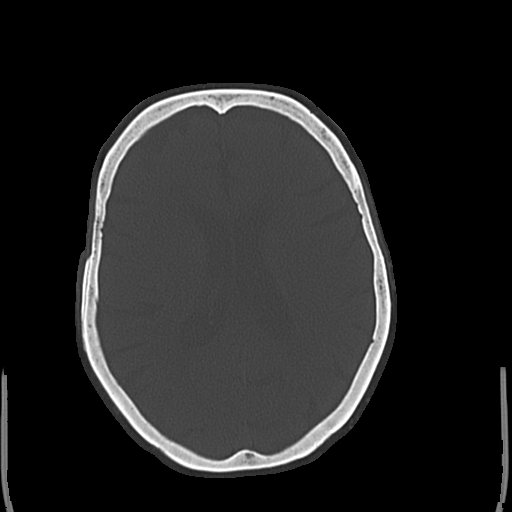
[im 35/52  bone]
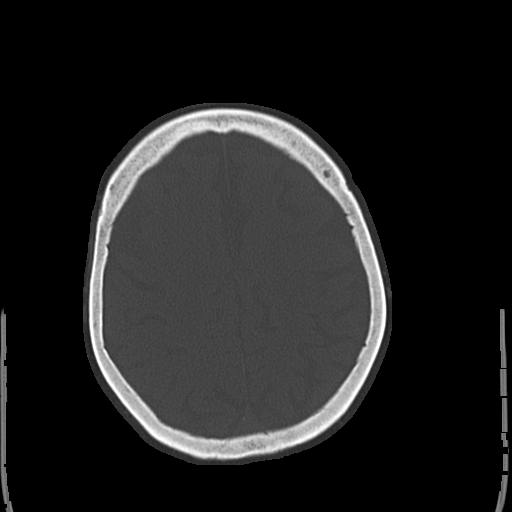
[im 40/52  bone]
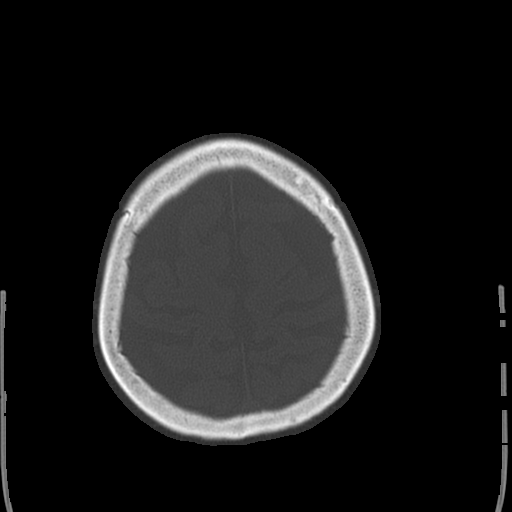

[16 of 30 positions shown; findings below may reference images not displayed]

FINDINGS: There is no evidence of acute infarction, mass lesion, or intra- or
extra-axial hemorrhage on CT.

Prominence of the ventricles and sulci reflects moderate cortical
volume loss. Cerebellar atrophy is noted. Scattered periventricular
and subcortical white matter change likely reflects small vessel
ischemic microangiopathy.

The brainstem and fourth ventricle are within normal limits. The
basal ganglia are unremarkable in appearance. The cerebral
hemispheres demonstrate grossly normal gray-white differentiation.
No mass effect or midline shift is seen.

There is no evidence of fracture; visualized osseous structures are
unremarkable in appearance. The orbits are within normal limits. The
paranasal sinuses and mastoid air cells are well-aerated. No
significant soft tissue abnormalities are seen.
IMPRESSION: 1. No evidence of traumatic intracranial injury or fracture.
2. Moderate cortical volume loss and scattered small vessel ischemic
microangiopathy.

## 2016-02-22 IMAGING — CR DG CHEST 2V
2 series · 2 of 2 positions shown · non-contrast
Comparison: Chest radiograph November 27, 2014

CLINICAL DATA: Altered mental status, agitated and confrontational.
History of dementia, hypertension, bronchitis.

EXAM:
CHEST  2 VIEW

[w chest lat]
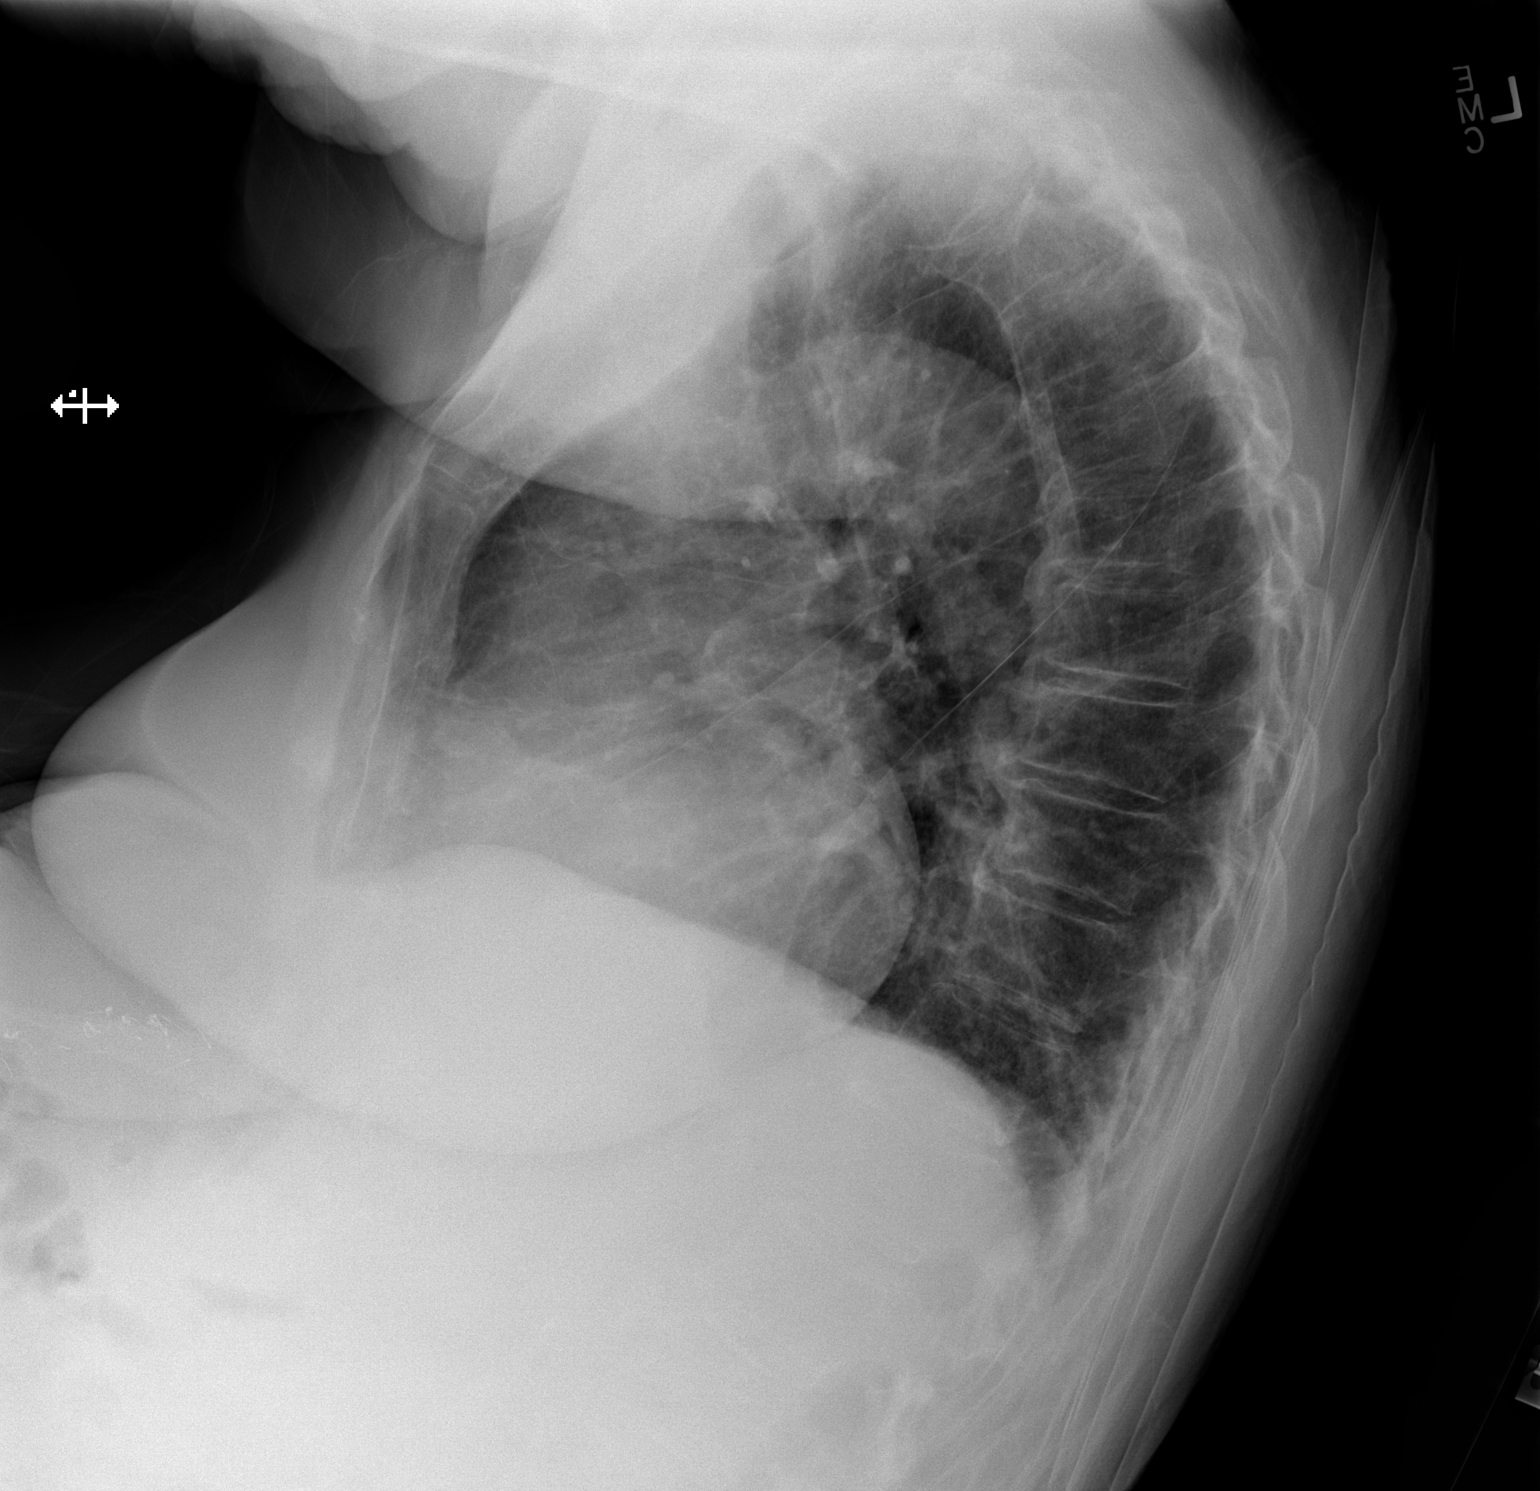

[x chest ap]
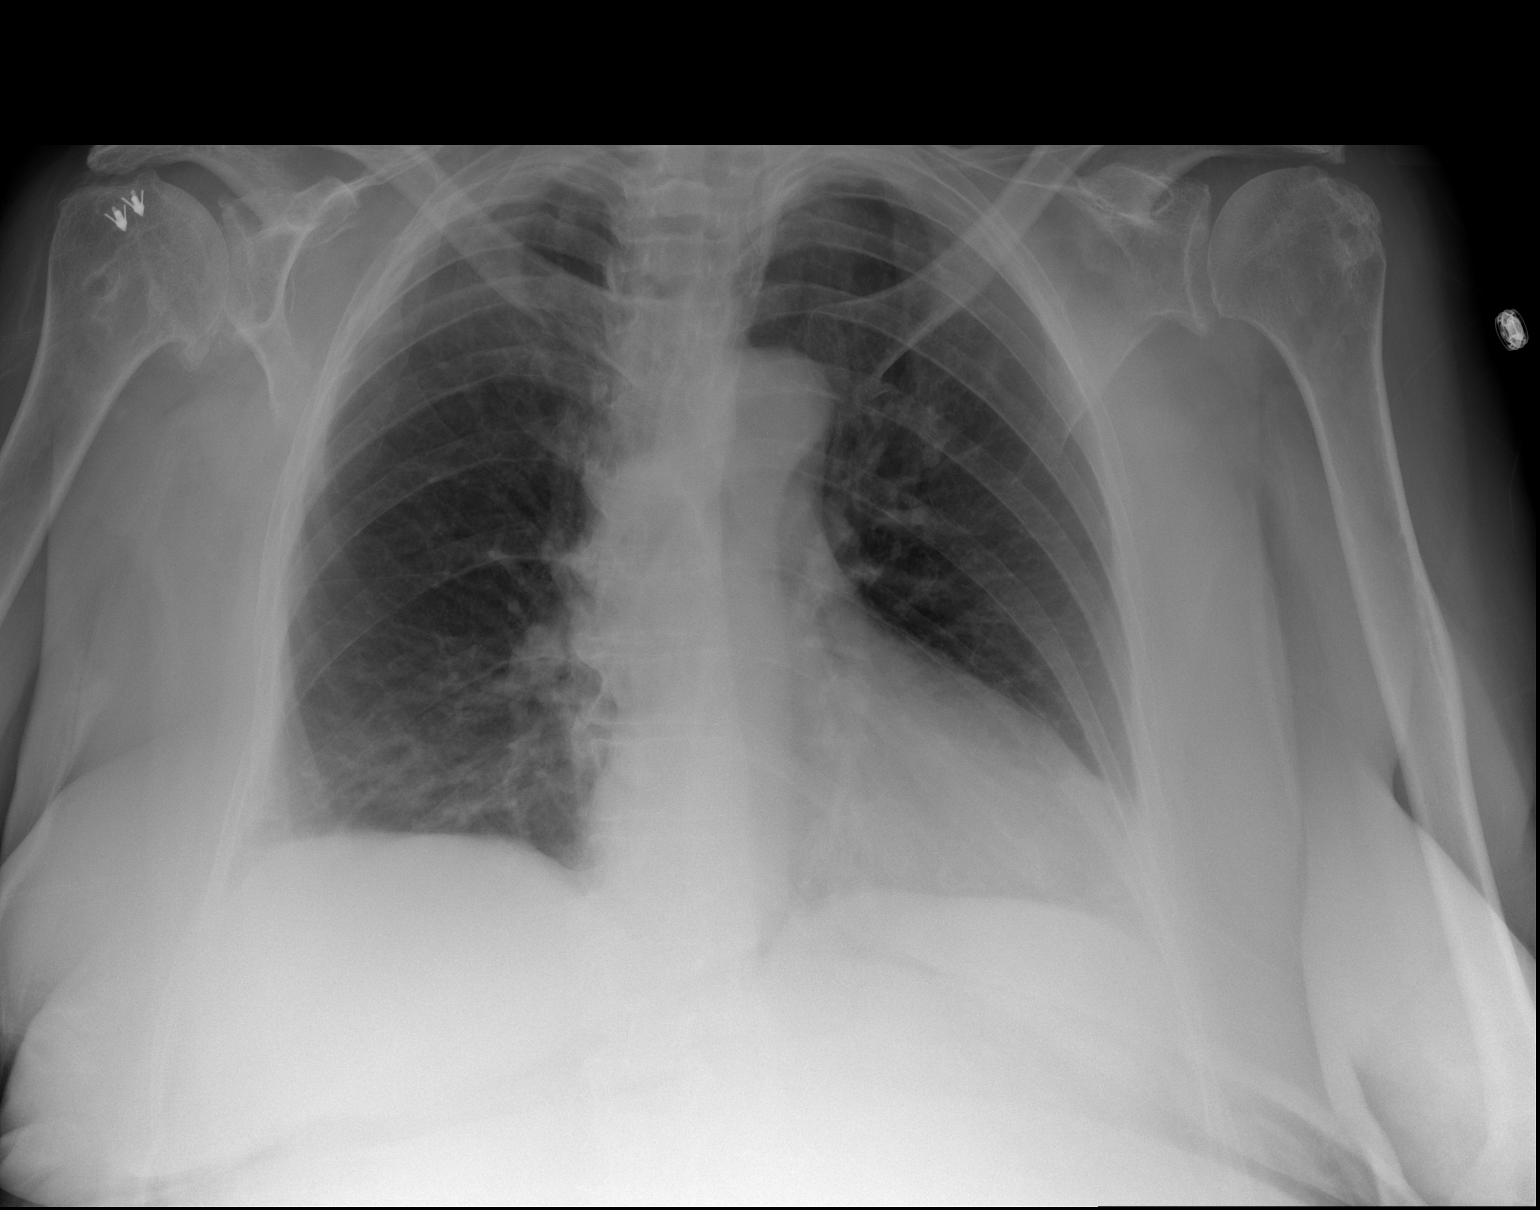

[2 of 2 positions shown; findings below may reference images not displayed]

FINDINGS: Cardiac silhouette is mildly enlarged. Mediastinal silhouette is
nonsuspicious, mildly calcified aortic knob. Mild apical pleural
thickening. No pleural effusion or focal consolidation. Bibasilar
strandy densities. No pneumothorax. Suture anchor RIGHT humeral head
with moderate degenerative change of the RIGHT shoulder. Moderate
degenerative change thoracic spine with mild dextroscoliosis.
Patient is osteopenic. Suture material along anterior abdominal
wall.
IMPRESSION: Mild cardiomegaly.  Bibasilar atelectasis.

## 2016-02-23 DIAGNOSIS — G0430 Acute necrotizing hemorrhagic encephalopathy, unspecified: Secondary | ICD-10-CM | POA: Diagnosis not present

## 2016-02-24 DIAGNOSIS — G0430 Acute necrotizing hemorrhagic encephalopathy, unspecified: Secondary | ICD-10-CM | POA: Diagnosis not present

## 2016-02-25 DIAGNOSIS — G0430 Acute necrotizing hemorrhagic encephalopathy, unspecified: Secondary | ICD-10-CM | POA: Diagnosis not present

## 2016-02-26 DIAGNOSIS — G0430 Acute necrotizing hemorrhagic encephalopathy, unspecified: Secondary | ICD-10-CM | POA: Diagnosis not present

## 2016-02-27 DIAGNOSIS — G0430 Acute necrotizing hemorrhagic encephalopathy, unspecified: Secondary | ICD-10-CM | POA: Diagnosis not present

## 2016-02-28 DIAGNOSIS — G0430 Acute necrotizing hemorrhagic encephalopathy, unspecified: Secondary | ICD-10-CM | POA: Diagnosis not present

## 2016-02-29 DIAGNOSIS — G0430 Acute necrotizing hemorrhagic encephalopathy, unspecified: Secondary | ICD-10-CM | POA: Diagnosis not present

## 2016-03-01 DIAGNOSIS — G0430 Acute necrotizing hemorrhagic encephalopathy, unspecified: Secondary | ICD-10-CM | POA: Diagnosis not present

## 2016-03-02 DIAGNOSIS — G0430 Acute necrotizing hemorrhagic encephalopathy, unspecified: Secondary | ICD-10-CM | POA: Diagnosis not present

## 2016-03-03 DIAGNOSIS — G0430 Acute necrotizing hemorrhagic encephalopathy, unspecified: Secondary | ICD-10-CM | POA: Diagnosis not present

## 2016-03-04 DIAGNOSIS — G0430 Acute necrotizing hemorrhagic encephalopathy, unspecified: Secondary | ICD-10-CM | POA: Diagnosis not present

## 2016-03-05 DIAGNOSIS — R32 Unspecified urinary incontinence: Secondary | ICD-10-CM | POA: Diagnosis not present

## 2016-03-05 DIAGNOSIS — G0430 Acute necrotizing hemorrhagic encephalopathy, unspecified: Secondary | ICD-10-CM | POA: Diagnosis not present

## 2016-03-06 DIAGNOSIS — G0430 Acute necrotizing hemorrhagic encephalopathy, unspecified: Secondary | ICD-10-CM | POA: Diagnosis not present

## 2016-03-07 DIAGNOSIS — G0430 Acute necrotizing hemorrhagic encephalopathy, unspecified: Secondary | ICD-10-CM | POA: Diagnosis not present

## 2016-03-08 DIAGNOSIS — G0430 Acute necrotizing hemorrhagic encephalopathy, unspecified: Secondary | ICD-10-CM | POA: Diagnosis not present

## 2016-03-09 DIAGNOSIS — G0430 Acute necrotizing hemorrhagic encephalopathy, unspecified: Secondary | ICD-10-CM | POA: Diagnosis not present

## 2016-03-10 DIAGNOSIS — G0430 Acute necrotizing hemorrhagic encephalopathy, unspecified: Secondary | ICD-10-CM | POA: Diagnosis not present

## 2016-03-11 DIAGNOSIS — G0430 Acute necrotizing hemorrhagic encephalopathy, unspecified: Secondary | ICD-10-CM | POA: Diagnosis not present

## 2016-03-12 DIAGNOSIS — G0430 Acute necrotizing hemorrhagic encephalopathy, unspecified: Secondary | ICD-10-CM | POA: Diagnosis not present

## 2016-03-13 DIAGNOSIS — G0430 Acute necrotizing hemorrhagic encephalopathy, unspecified: Secondary | ICD-10-CM | POA: Diagnosis not present

## 2016-03-14 ENCOUNTER — Other Ambulatory Visit: Payer: Self-pay | Admitting: *Deleted

## 2016-03-14 DIAGNOSIS — G0430 Acute necrotizing hemorrhagic encephalopathy, unspecified: Secondary | ICD-10-CM | POA: Diagnosis not present

## 2016-03-14 NOTE — Patient Outreach (Signed)
[facility] Care Management  03/14/2016  [name] Feb 13, 1921 [id number]   Referral received from care management assistant after member appeared on [employer] Tier 4 high risk list.  Per chart, member is currently a [facility] unit.  She has had 2 ED visits and 1 hospitalization in the last 6 months.  Call placed to member's POA, son [name], to assess current needs.  He report that she is being cared for "very well" at the facility and that he has no concerns or needs at this time.  He state that they provide all resources the member is in need of, confirming that her dementia continues to advance.  He report that there is no concern for [facility] involvement right now, but will contact in the future if needs change.  Will not open case at this time.  Kemper DurieMonica Niccole Witthuhn, [place], MSN [facility] Care [facility]  [facility] Care Manager [phone]

## 2016-03-15 DIAGNOSIS — G0430 Acute necrotizing hemorrhagic encephalopathy, unspecified: Secondary | ICD-10-CM | POA: Diagnosis not present

## 2016-03-16 DIAGNOSIS — G0430 Acute necrotizing hemorrhagic encephalopathy, unspecified: Secondary | ICD-10-CM | POA: Diagnosis not present

## 2016-03-17 DIAGNOSIS — G0430 Acute necrotizing hemorrhagic encephalopathy, unspecified: Secondary | ICD-10-CM | POA: Diagnosis not present

## 2016-03-18 DIAGNOSIS — G0430 Acute necrotizing hemorrhagic encephalopathy, unspecified: Secondary | ICD-10-CM | POA: Diagnosis not present

## 2016-03-19 DIAGNOSIS — G0430 Acute necrotizing hemorrhagic encephalopathy, unspecified: Secondary | ICD-10-CM | POA: Diagnosis not present

## 2016-03-20 DIAGNOSIS — G0430 Acute necrotizing hemorrhagic encephalopathy, unspecified: Secondary | ICD-10-CM | POA: Diagnosis not present

## 2016-03-21 DIAGNOSIS — G0430 Acute necrotizing hemorrhagic encephalopathy, unspecified: Secondary | ICD-10-CM | POA: Diagnosis not present

## 2016-03-22 DIAGNOSIS — G0430 Acute necrotizing hemorrhagic encephalopathy, unspecified: Secondary | ICD-10-CM | POA: Diagnosis not present

## 2016-03-23 DIAGNOSIS — G0430 Acute necrotizing hemorrhagic encephalopathy, unspecified: Secondary | ICD-10-CM | POA: Diagnosis not present

## 2016-03-24 DIAGNOSIS — G0430 Acute necrotizing hemorrhagic encephalopathy, unspecified: Secondary | ICD-10-CM | POA: Diagnosis not present

## 2016-03-25 DIAGNOSIS — G0430 Acute necrotizing hemorrhagic encephalopathy, unspecified: Secondary | ICD-10-CM | POA: Diagnosis not present

## 2016-03-26 DIAGNOSIS — G0430 Acute necrotizing hemorrhagic encephalopathy, unspecified: Secondary | ICD-10-CM | POA: Diagnosis not present

## 2016-03-26 IMAGING — CR DG THORACIC SPINE 2V
3 series · 3 of 3 positions shown · non-contrast
Comparison: Chest radiographs 01/07/2015 and 12/05/2014.

CLINICAL DATA: Found down. Right periorbital hematoma. History of
dementia and back surgery.

EXAM:
THORACIC SPINE 2 VIEWS

[t-spine ap]
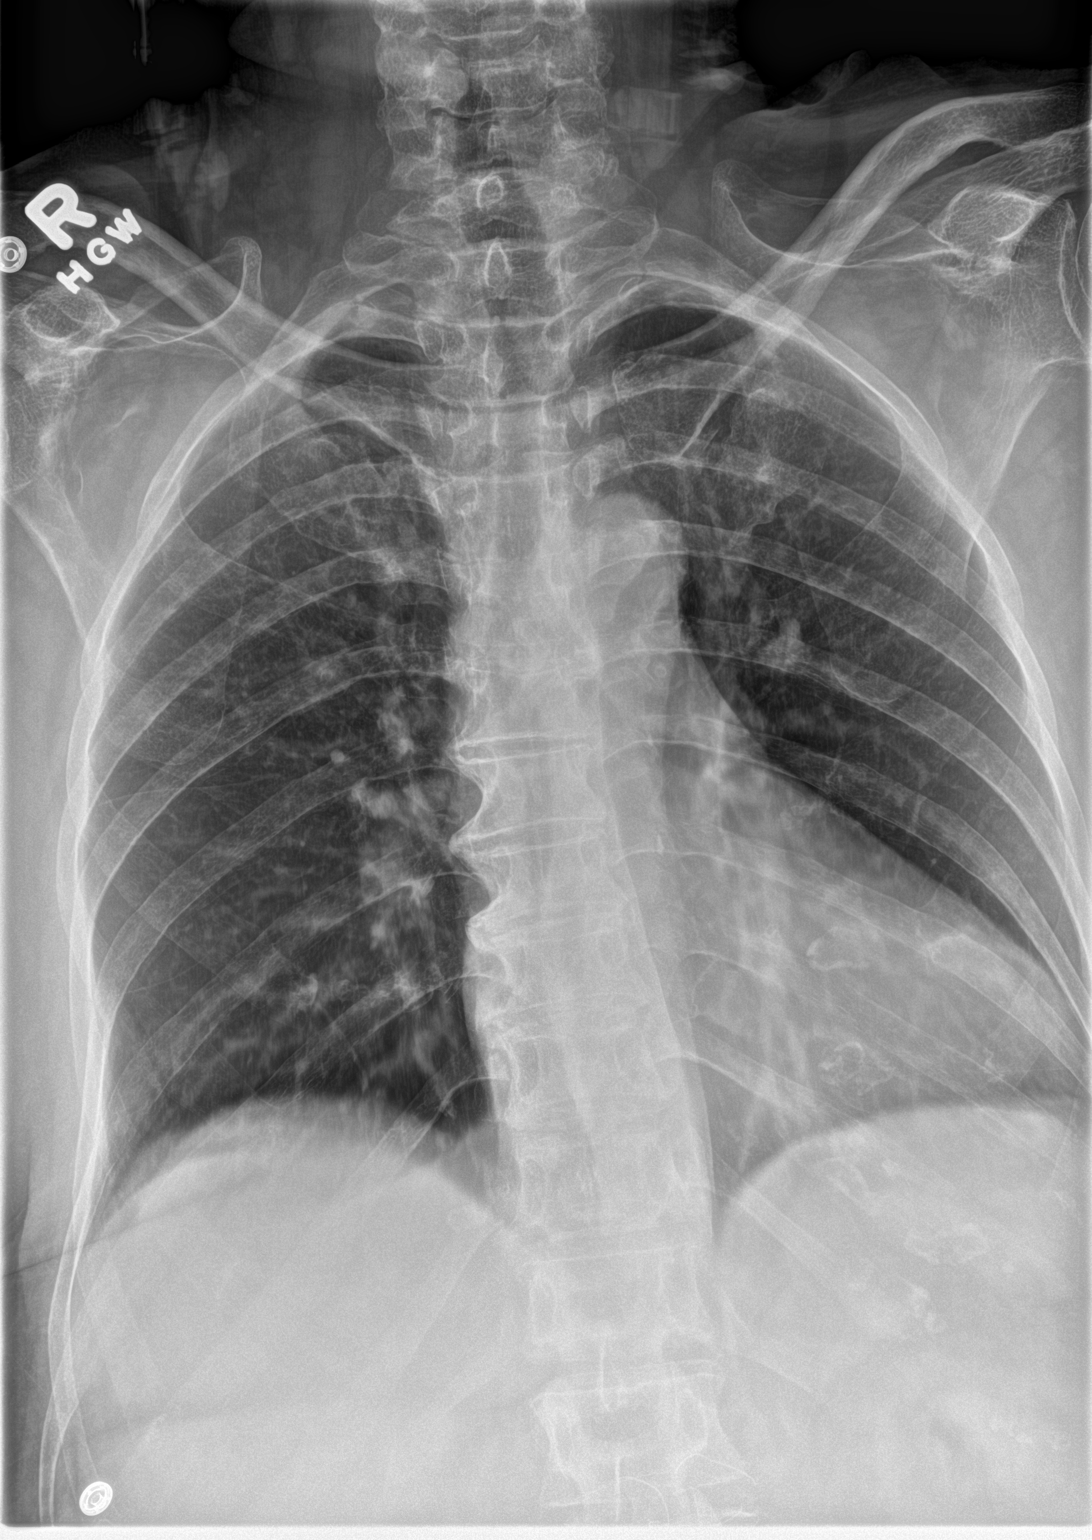

[t-spine lat (1 of 2)]
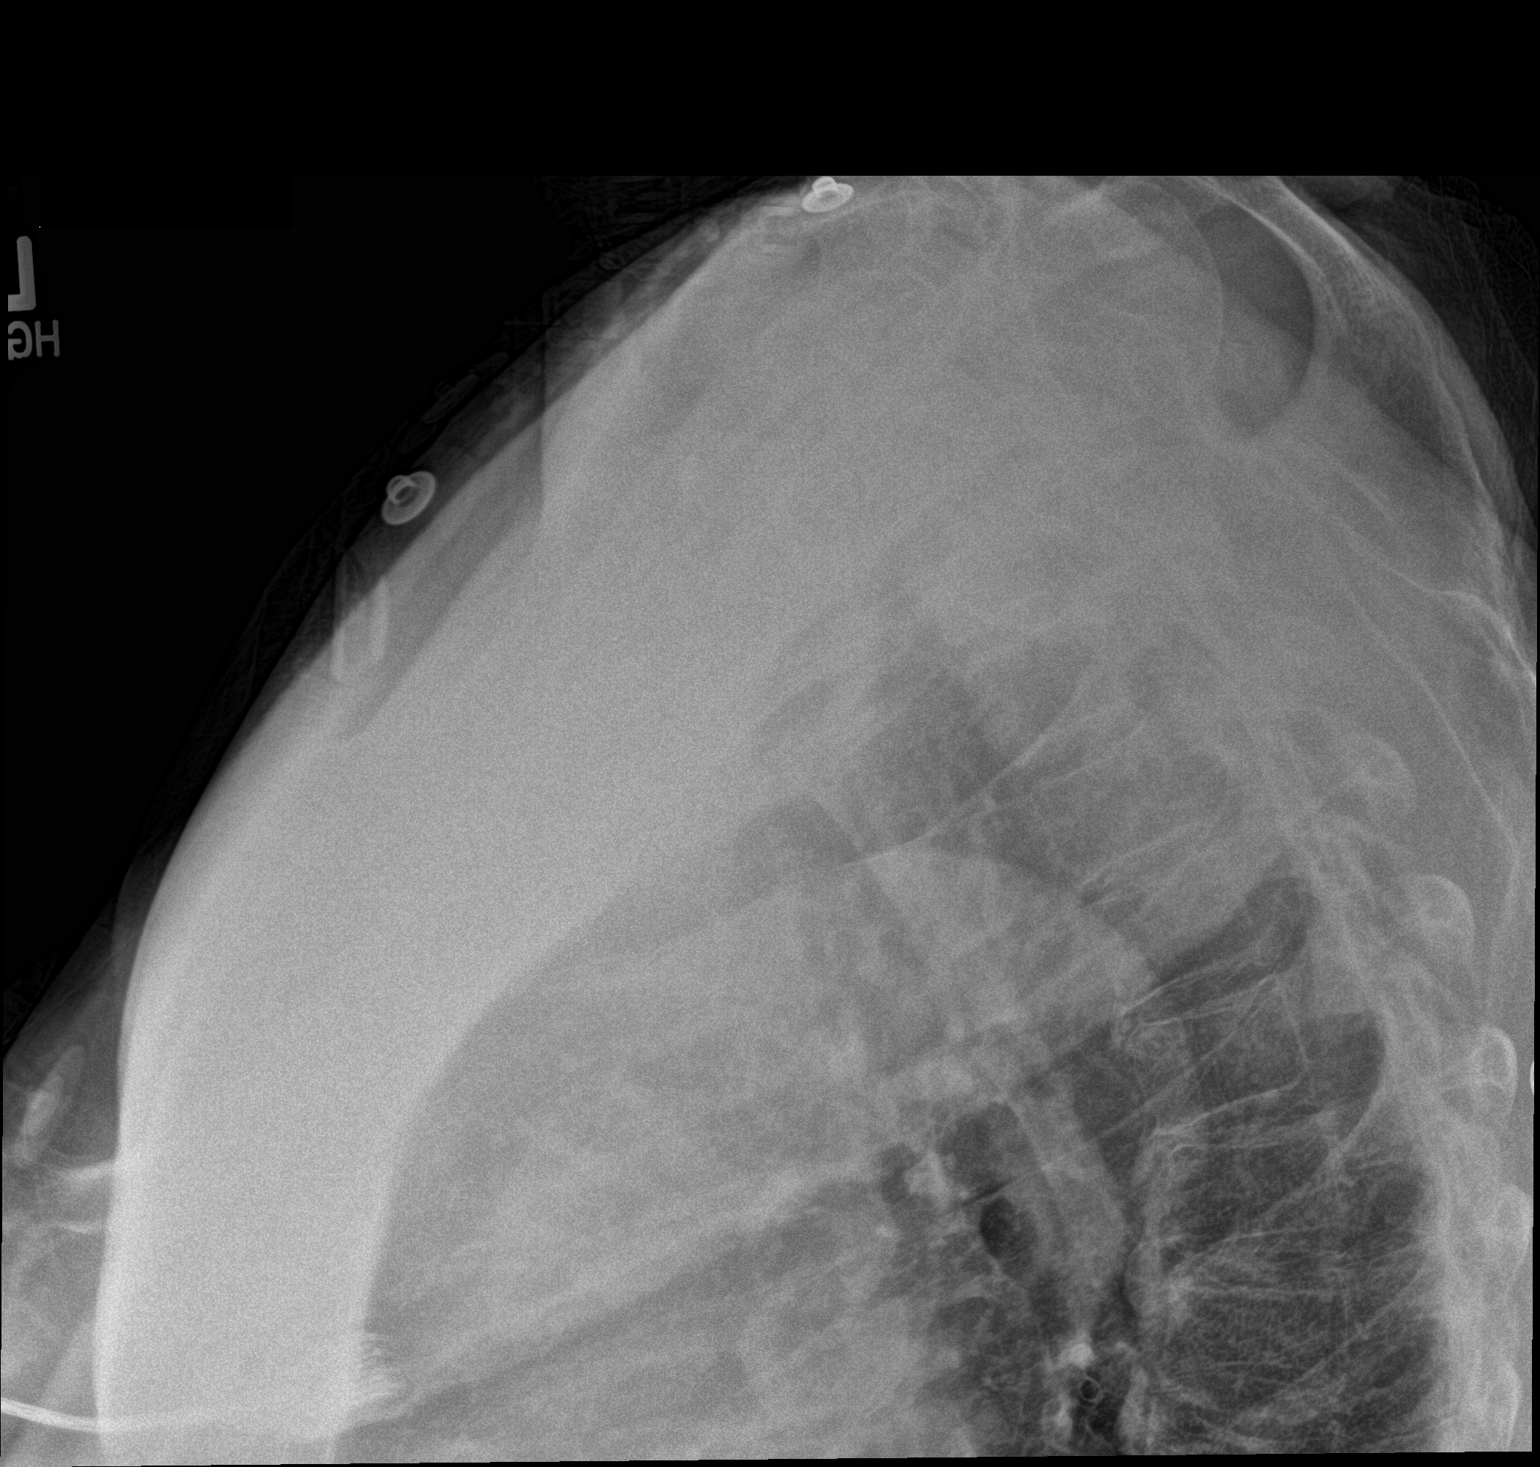

[t-spine lat (2 of 2)]
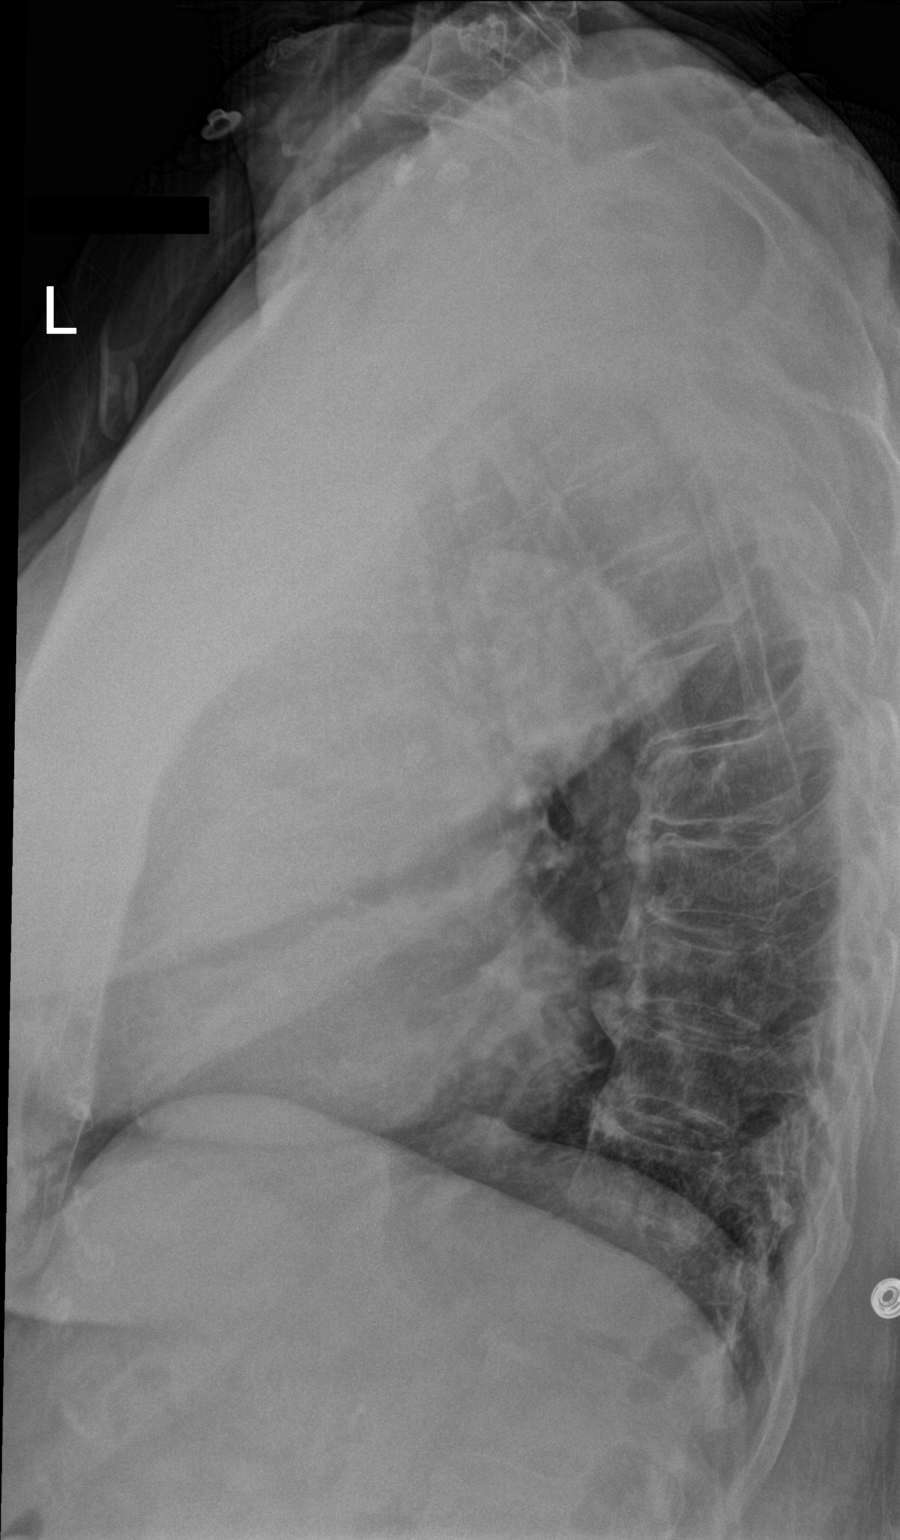

[3 of 3 positions shown; findings below may reference images not displayed]

FINDINGS: There are 12 rib-bearing thoracic type vertebral bodies. The bones
are mildly demineralized. There are mild degenerative changes
throughout the thoracic spine with near anatomic alignment. No
evidence of acute fracture or paraspinal hematoma.
IMPRESSION: No evidence of acute thoracic spine injury.

## 2016-03-26 IMAGING — CR DG CHEST 1V
1 series · 2 of 2 positions shown · non-contrast
Comparison: None.

CLINICAL DATA: Found down. Periorbital hematoma on the right with
knee abrasions. History of dementia.

EXAM:
12/05/2014.

[Series 1: chest ap · 0.14mm/px · 2 of 2 slices shown]
[im 1/2]
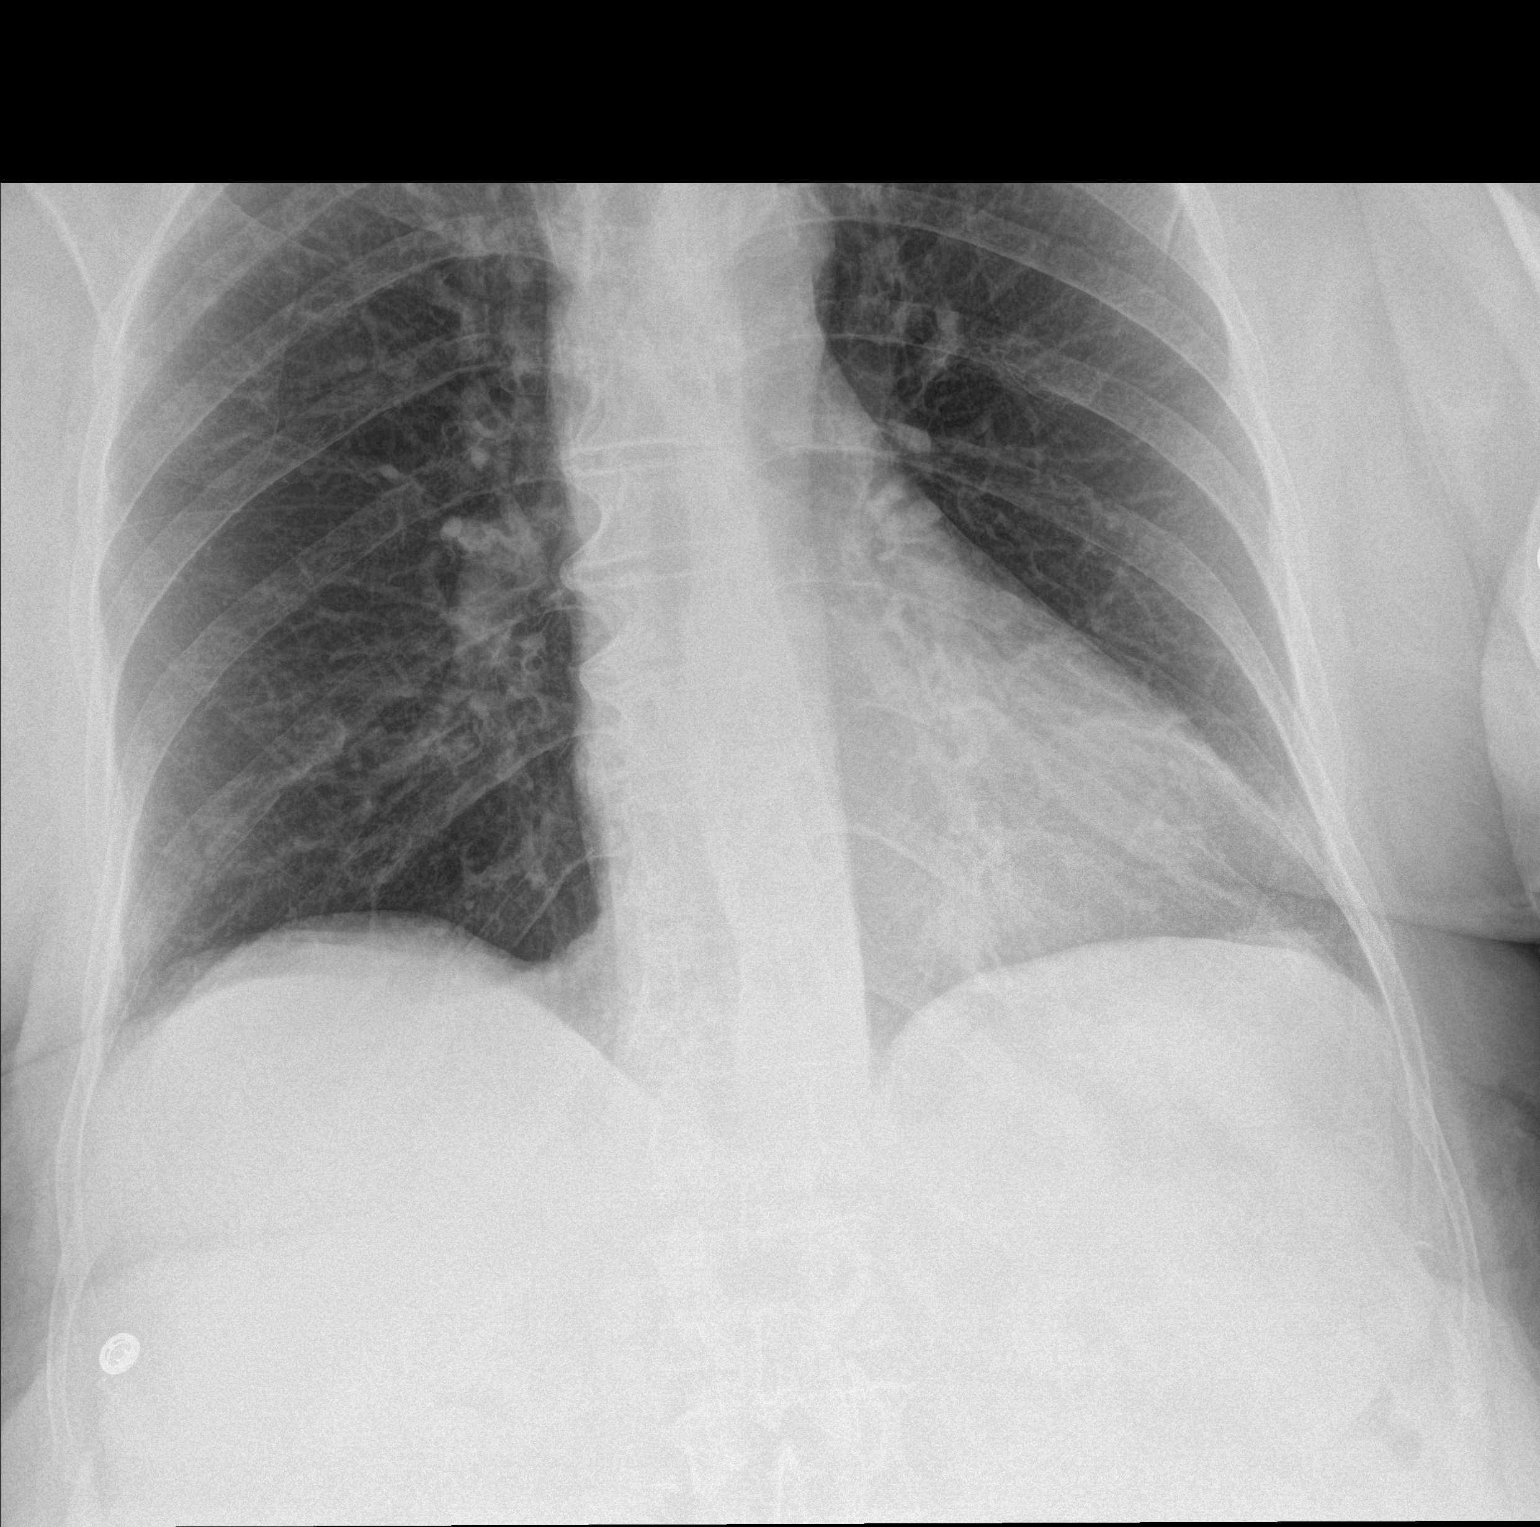
[im 2/2]
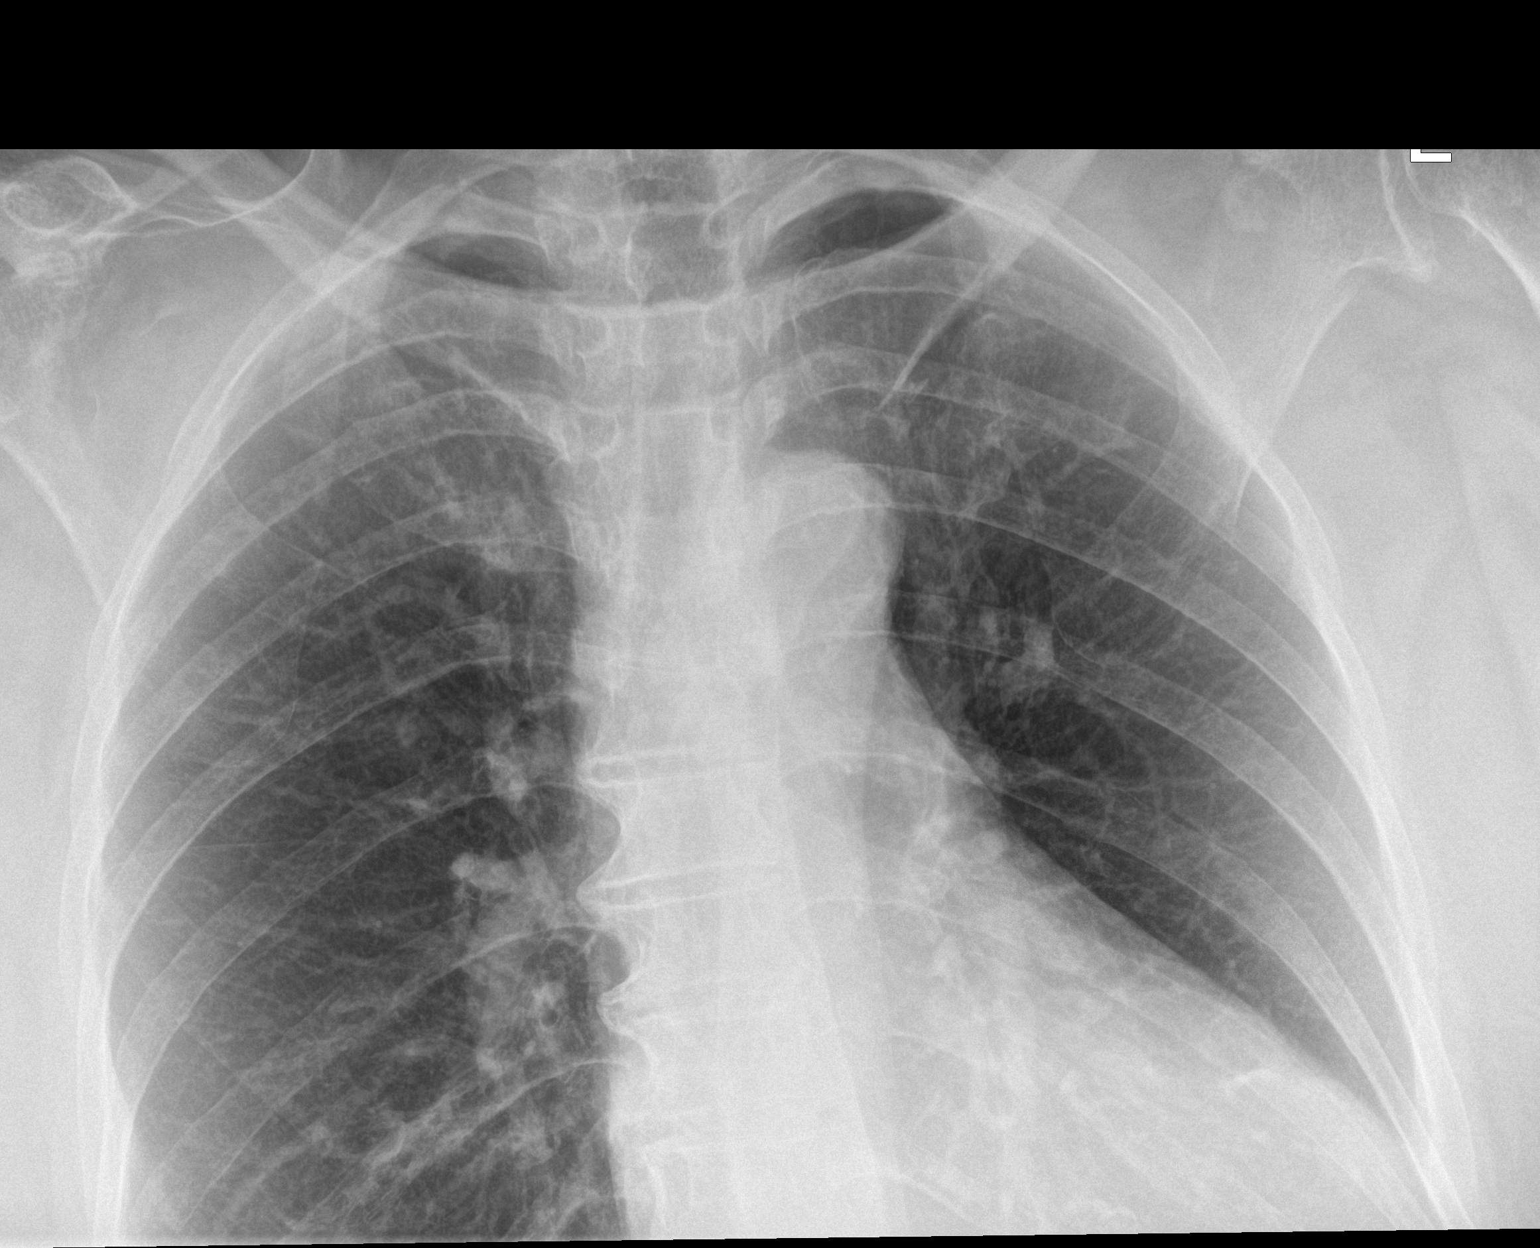

[2 of 2 positions shown; findings below may reference images not displayed]

FINDINGS: 7698 hr. Two views obtained. The heart size and mediastinal contours
are normal. The lungs are clear. There is no pleural effusion or
pneumothorax. No acute osseous findings are identified. Thoracic
spine degenerative changes noted.
IMPRESSION: No active cardiopulmonary process.

## 2016-03-26 IMAGING — CR DG LUMBAR SPINE COMPLETE 4+V
4 series · 4 of 4 positions shown · non-contrast
Comparison: 11/27/2014

CLINICAL DATA: Per ED note: To room via EMS. Pt from [facility]. Pt is sedated and is usually kept this way, if not, pt kicks
and screams. Staff found pt on floor. Hematoma above right eye,
abrasion on right knee. H/o HTN, dementia, back surgery

EXAM:
LUMBAR SPINE - COMPLETE 4+ VIEW

[l-spine ap]
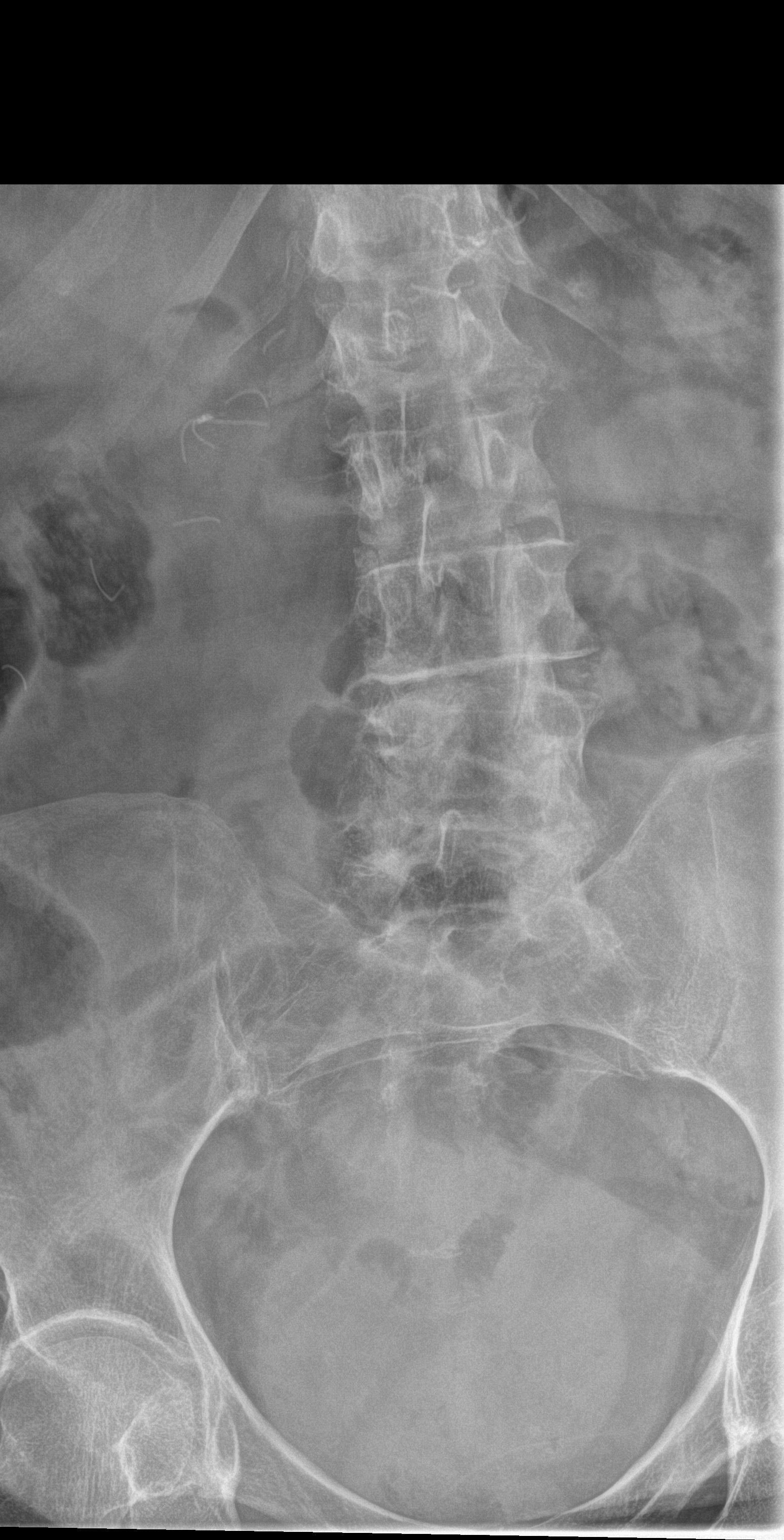

[l-spine obl (1 of 2)]
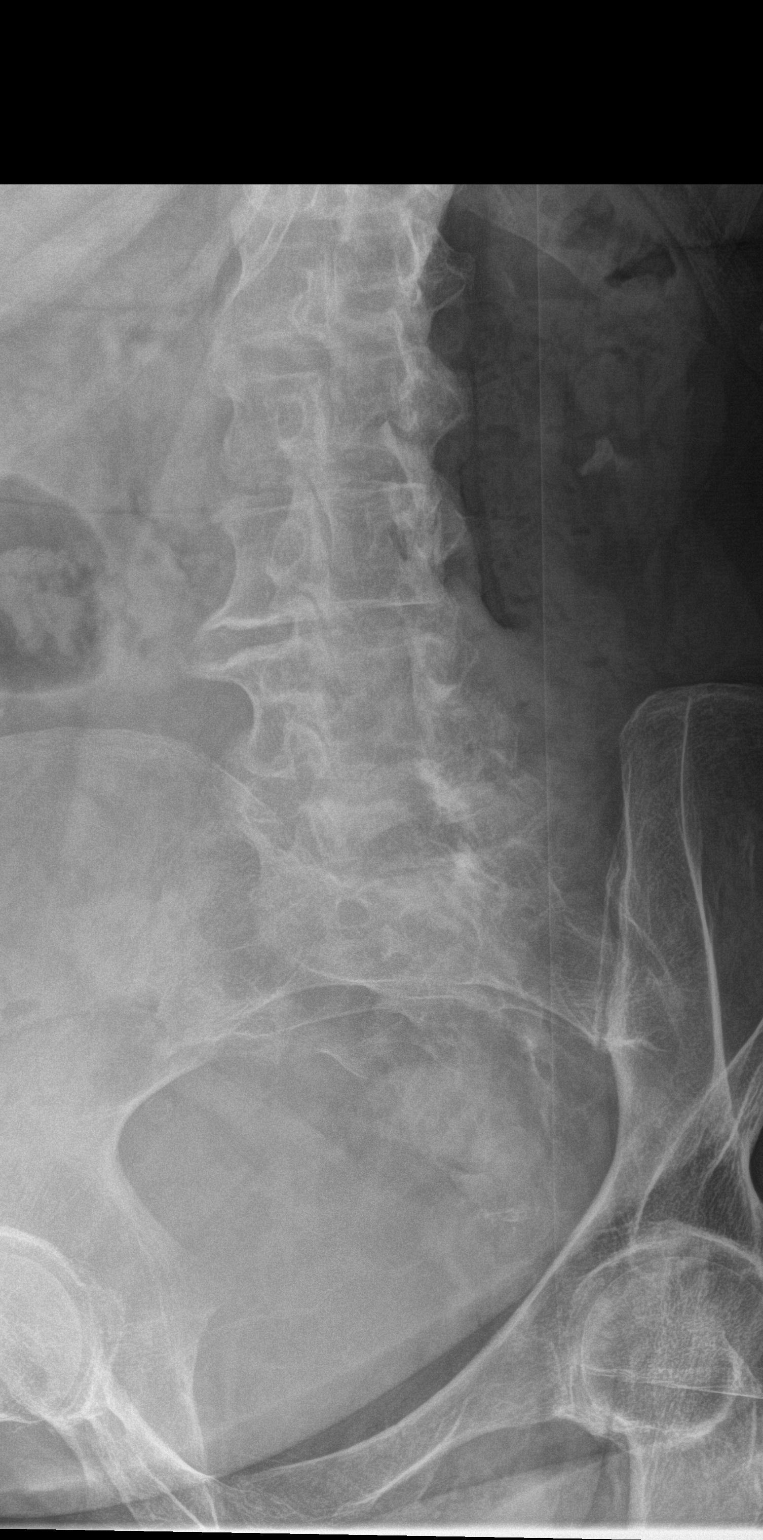

[l-spine obl (2 of 2)]
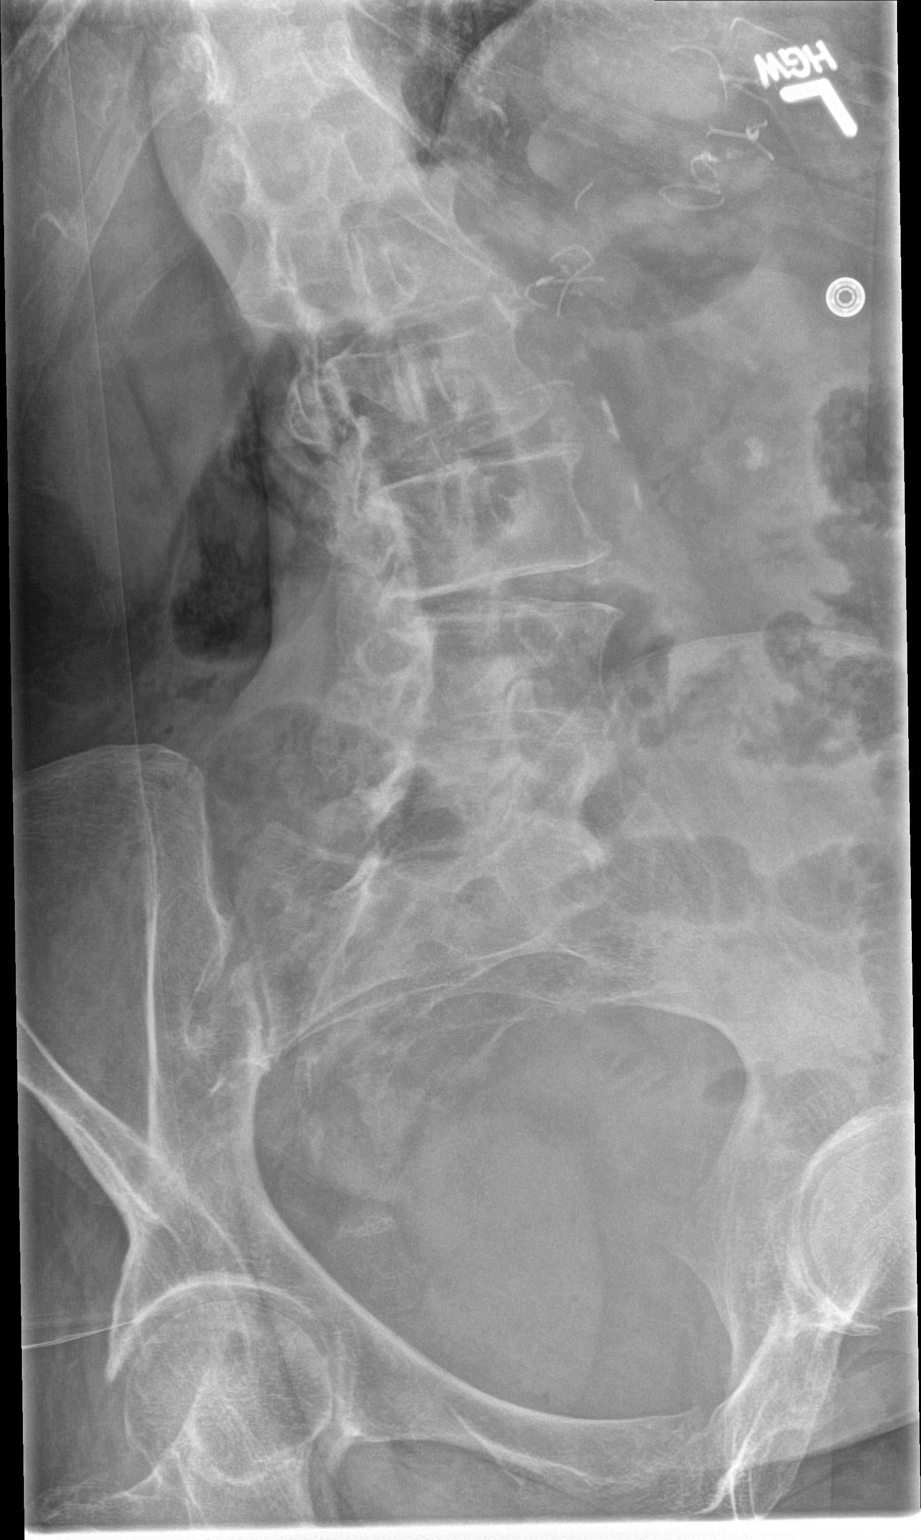

[l-spine lat]
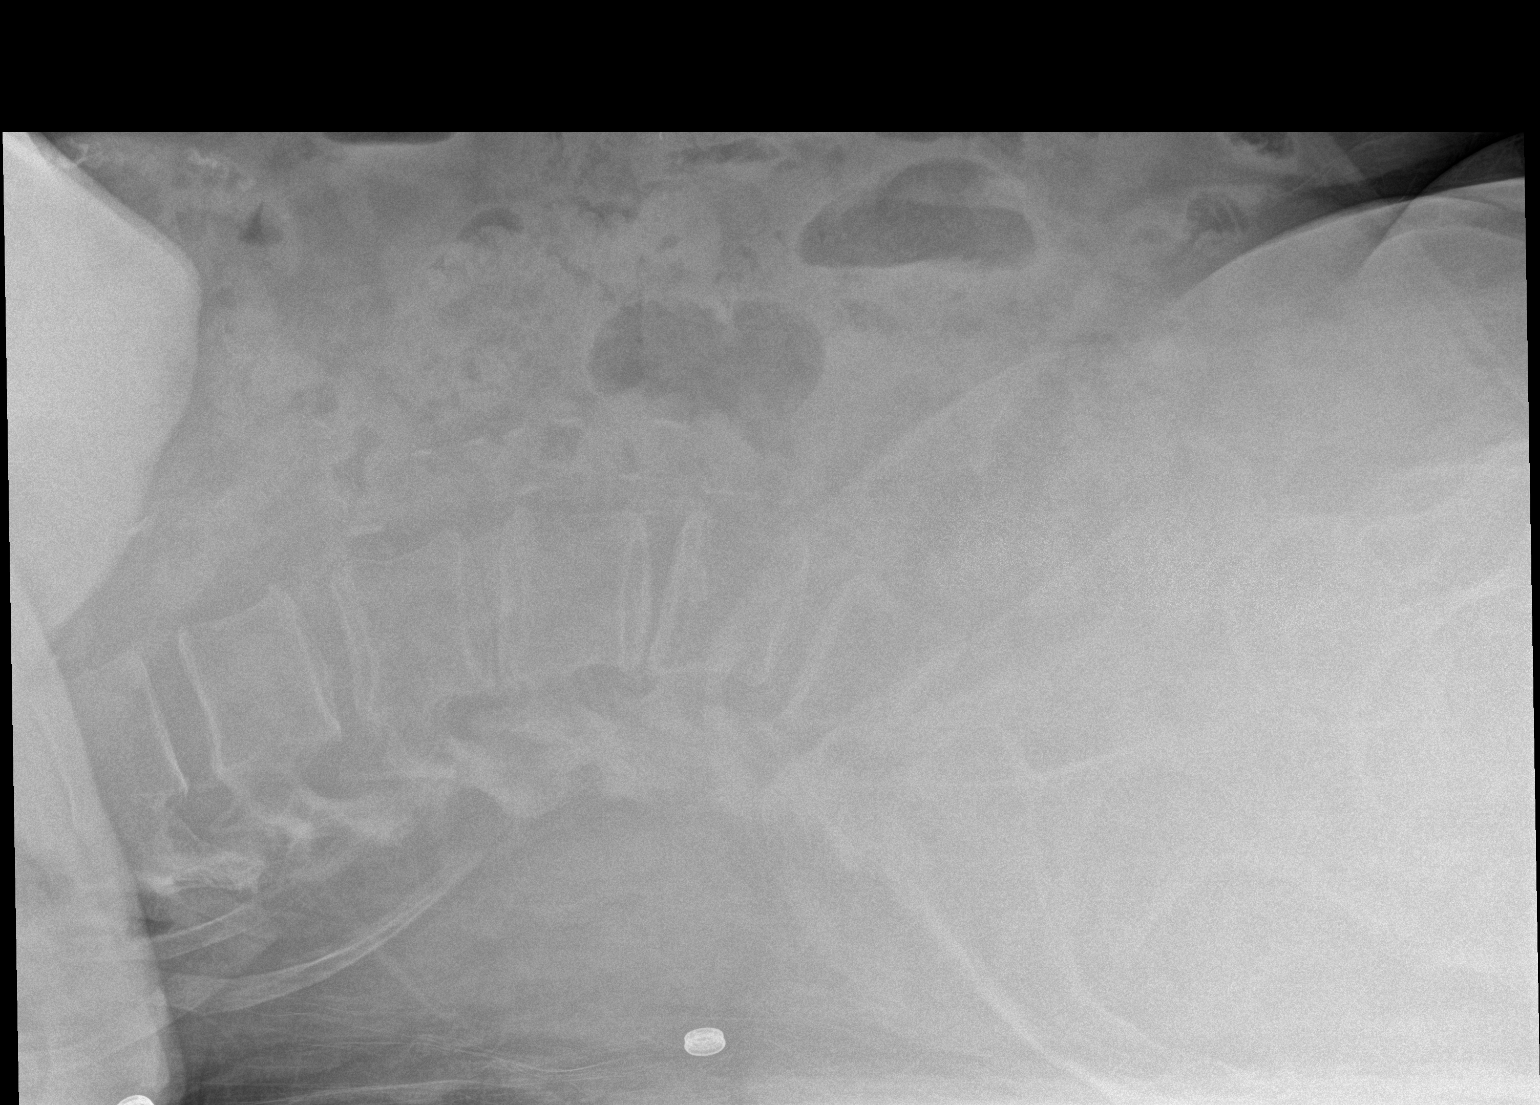

[4 of 4 positions shown; findings below may reference images not displayed]

FINDINGS: No fracture. No spondylolisthesis. Mild loss disc height from L2-L3
through L4-L5. Mild facet degenerative change in the lower lumbar
spine. Bones are demineralized. Soft tissues show aortic vascular
calcifications right quadrant surgical suture material.
IMPRESSION: 1. No fracture or acute finding. Degenerative changes, stable from
the prior study.

## 2016-03-26 IMAGING — CR DG HIP (WITH OR WITHOUT PELVIS) 3-4V BILAT
5 series · 5 of 5 positions shown · non-contrast
Comparison: None.

CLINICAL DATA: Per ED note: To room via EMS. Pt from [facility]. Pt is sedated and is usually kept this way, if not, pt kicks
and screams. Staff found pt on floor. Hematoma above right eye,
abrasion on right knee.

H/o HTN, dementia, back surgery
EXAM:
DG HIP (WITH OR WITHOUT PELVIS) 3-4V BILAT

[pelvis ap]
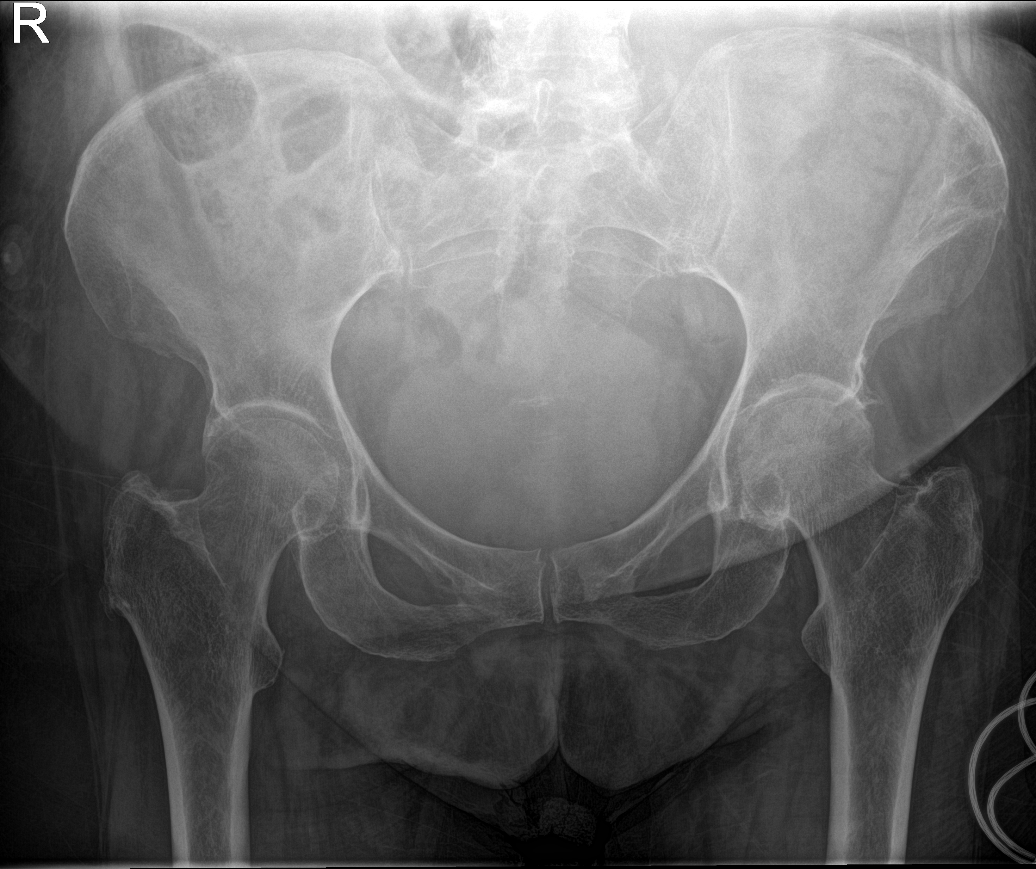

[hip ap (1 of 2)]
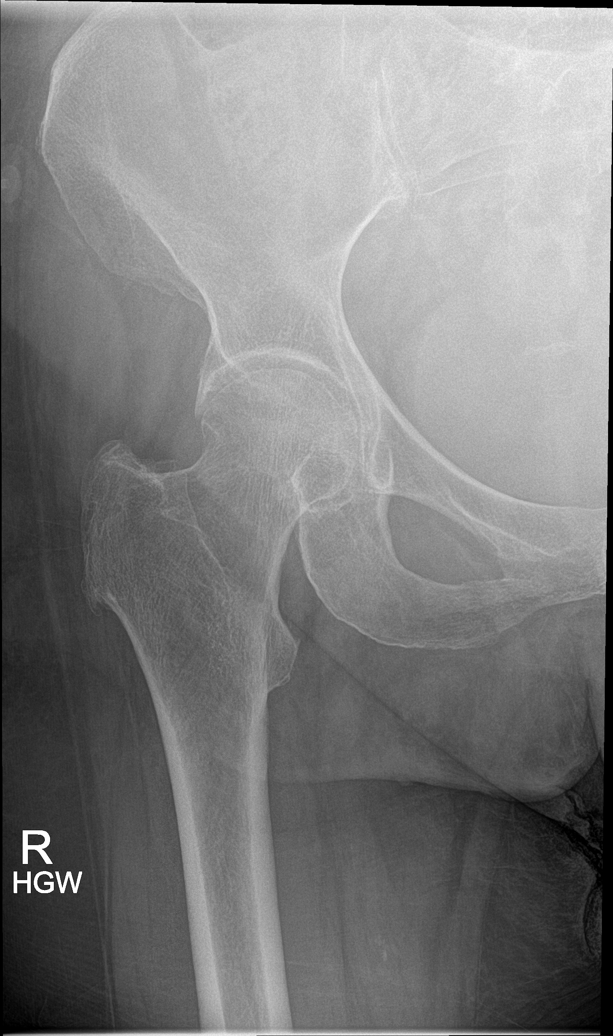

[hip lat (1 of 2)]
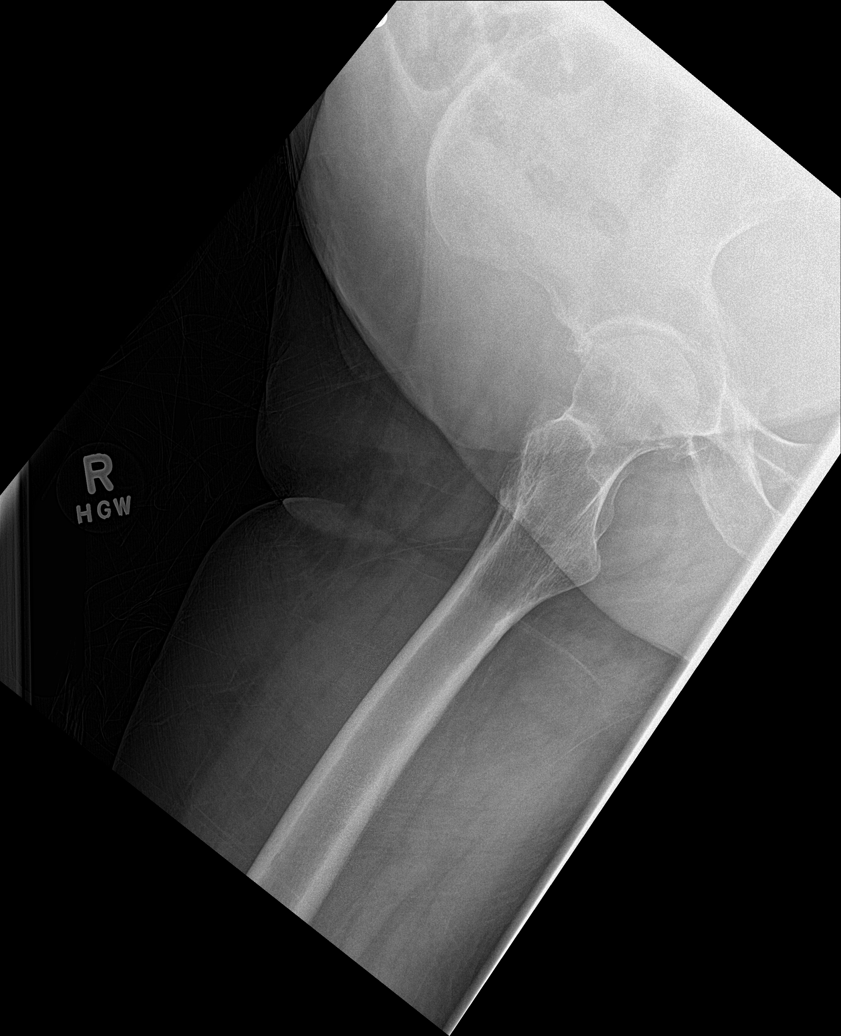

[hip ap (2 of 2)]
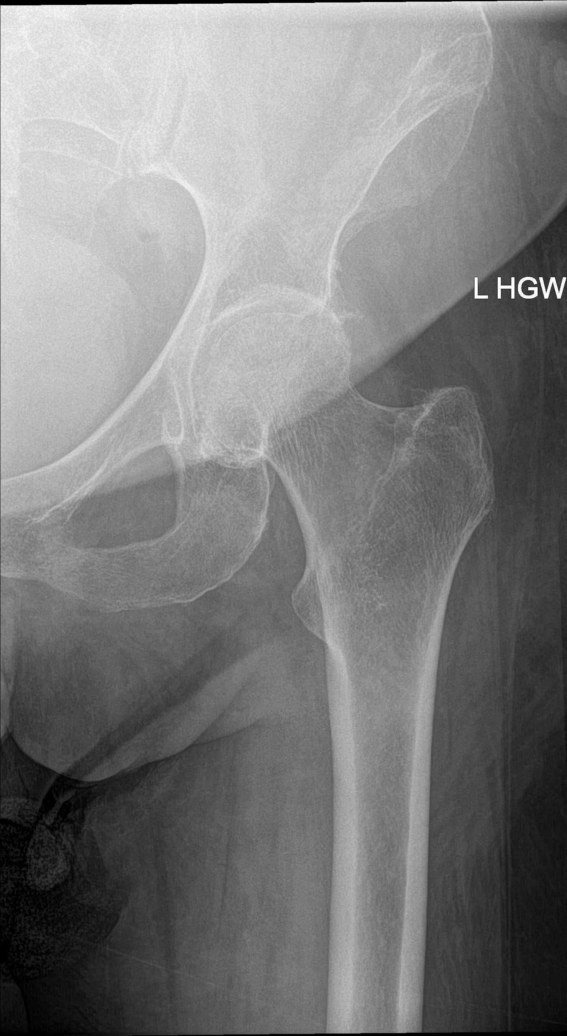

[hip lat (2 of 2)]
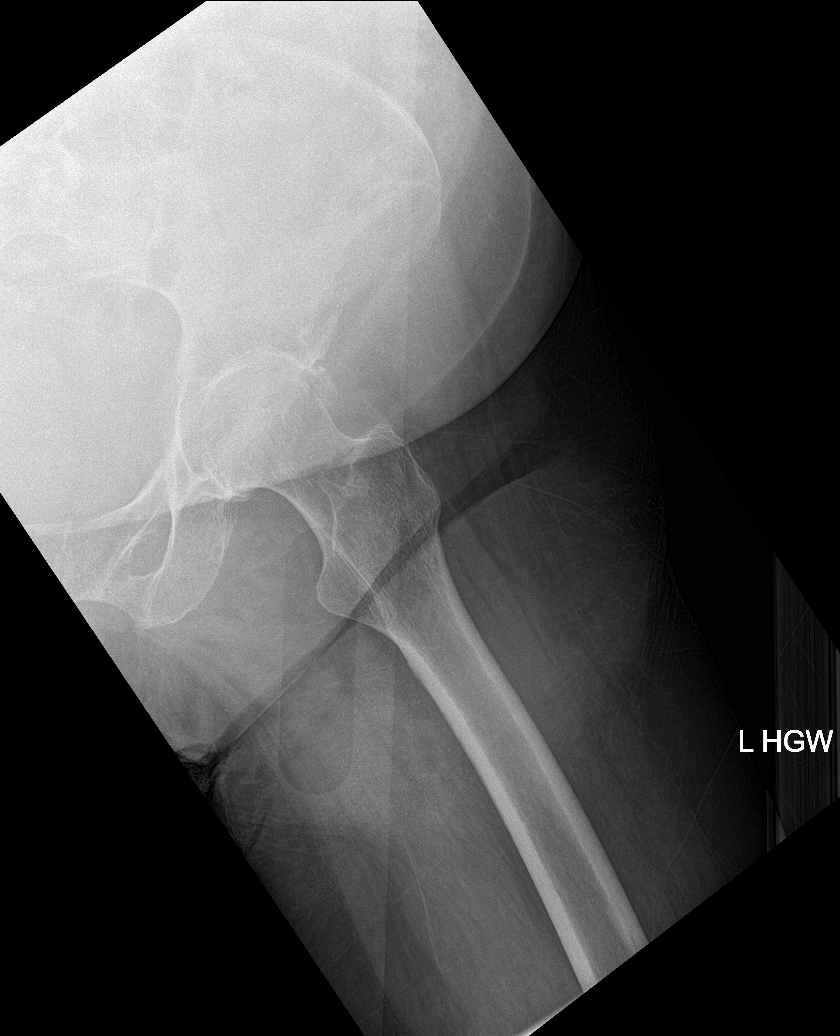

[5 of 5 positions shown; findings below may reference images not displayed]

FINDINGS: No fracture. No bone lesion. Bones demineralized. There is mild
superior lateral joint space narrowing. No other arthropathic
change. Soft tissues are unremarkable.
IMPRESSION: No fracture or acute finding.

## 2016-03-26 IMAGING — CT CT CERVICAL SPINE W/O CM
1 series · 12 of 14 positions shown, 15 images · non-contrast
Comparison: 12/05/2014

CLINICAL DATA: Patient fell, found on floor, large hematoma right
had

EXAM:
CT HEAD WITHOUT CONTRAST
CT CERVICAL SPINE WITHOUT CONTRAST
TECHNIQUE: Multidetector CT imaging of the head and cervical spine was
performed following the standard protocol without intravenous
contrast. Multiplanar CT image reconstructions of the cervical spine
were also generated.

[Series 2: head 5.0 h30s · axial · 0.46mm/px · z∈[-159,-24]mm · 12 of 33 slices shown, 15 images]
[im 3/33  soft-tissue]
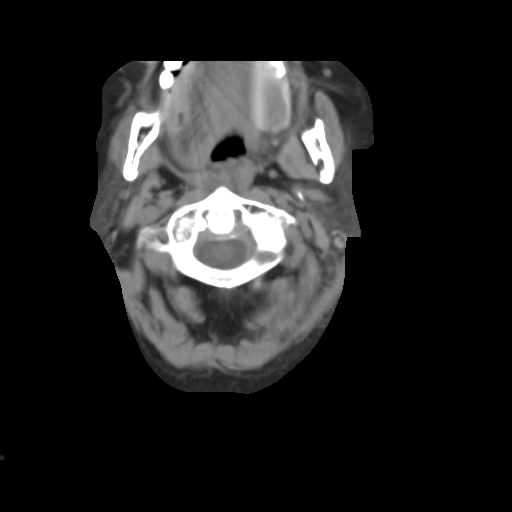
[im 3/33  bone]
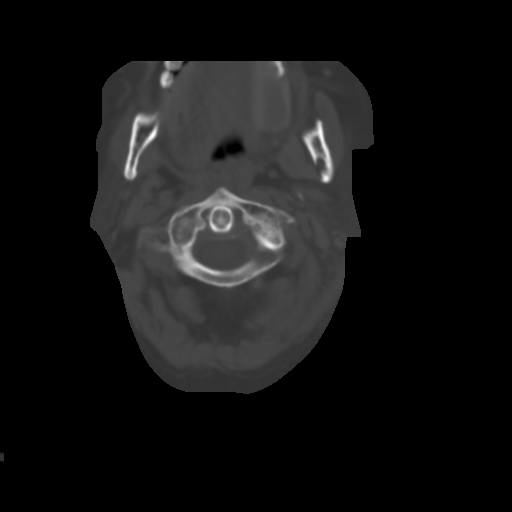
[im 5/33  bone]
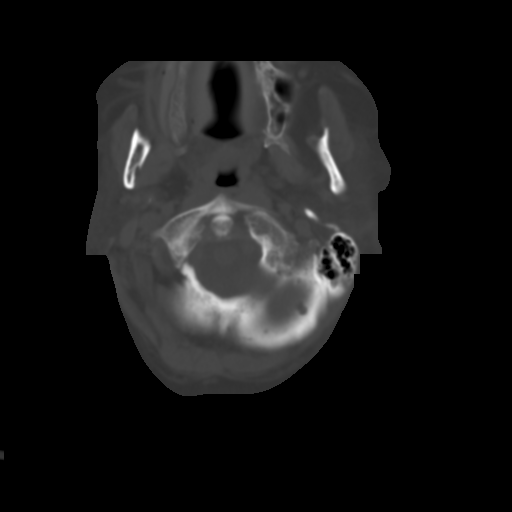
[im 8/33  bone]
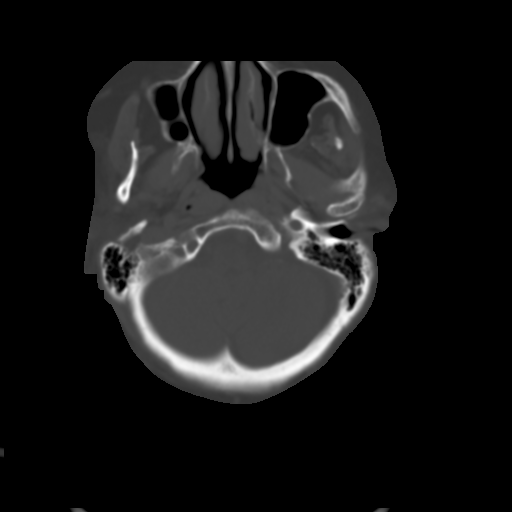
[im 10/33  bone]
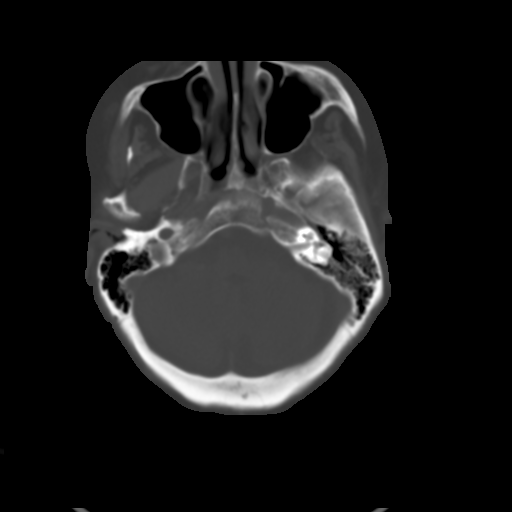
[im 13/33  soft-tissue]
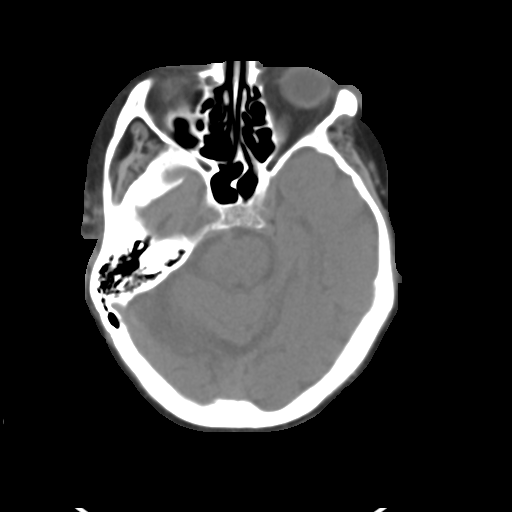
[im 13/33  bone]
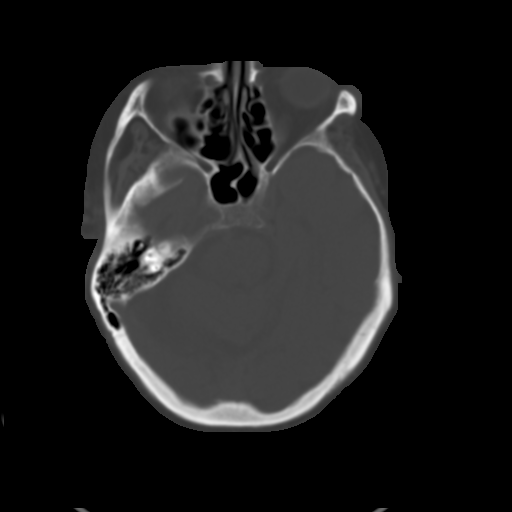
[im 15/33  bone]
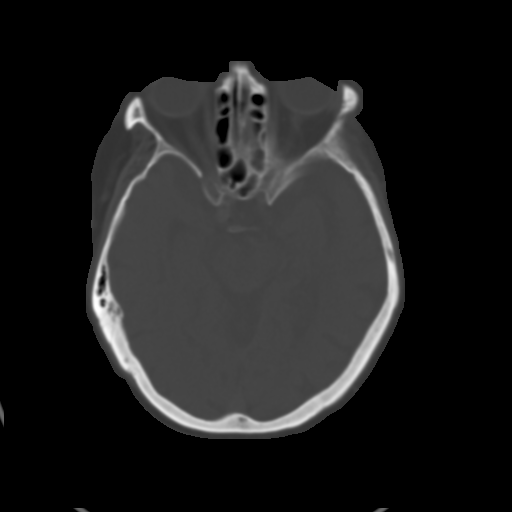
[im 18/33  bone]
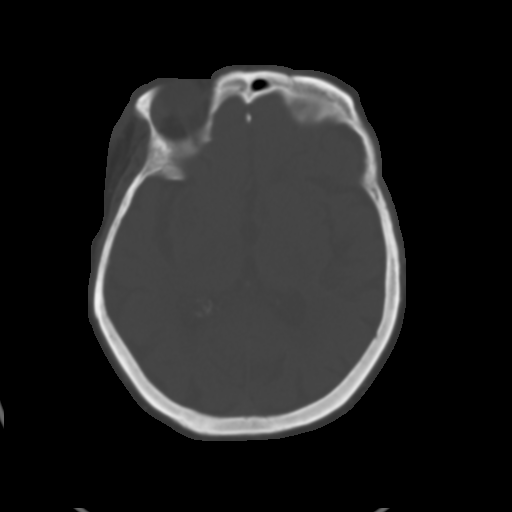
[im 20/33  bone]
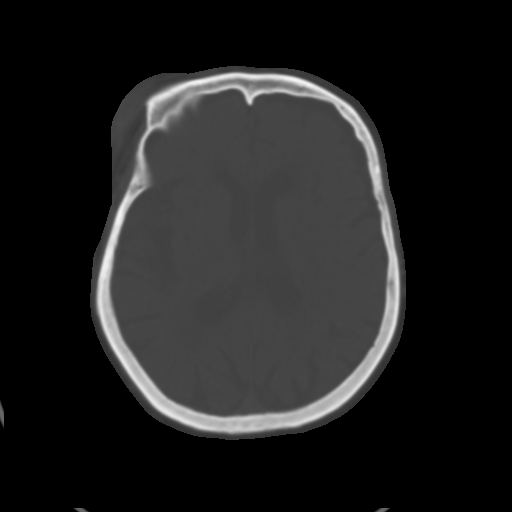
[im 23/33  soft-tissue]
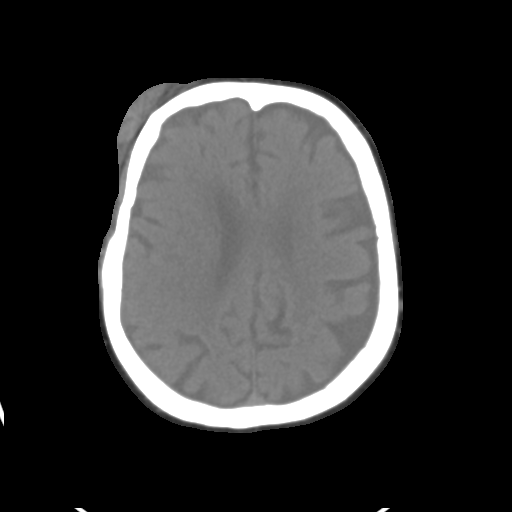
[im 23/33  bone]
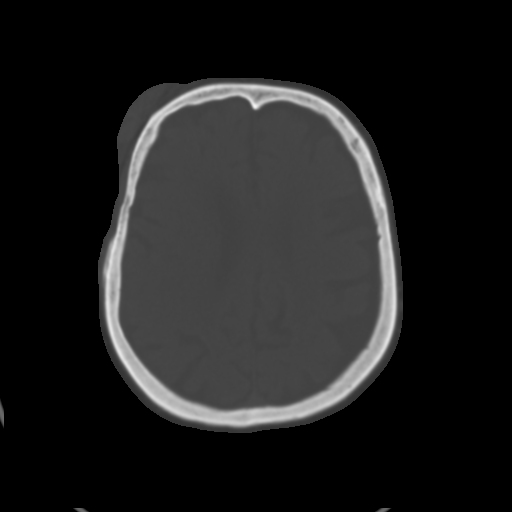
[im 25/33  bone]
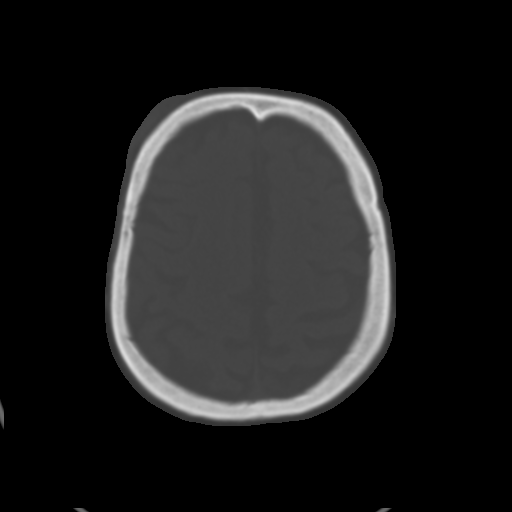
[im 28/33  bone]
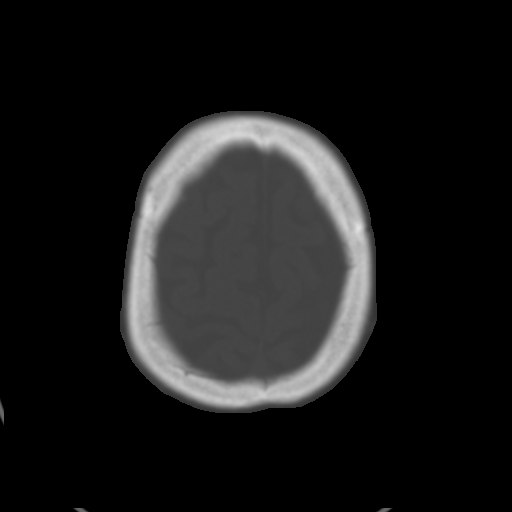
[im 30/33  bone]
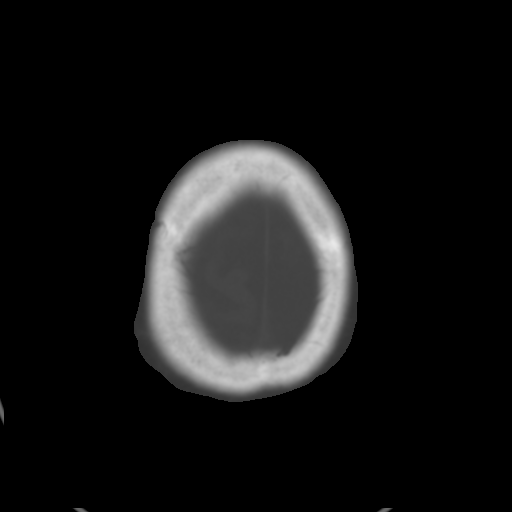

[12 of 14 positions shown; findings below may reference images not displayed]

FINDINGS: CT HEAD FINDINGS

Large right frontal temporal scalp hematoma. No skull fracture. No
intracranial hemorrhage or extra-axial fluid. Atrophy with
microvascular changes in the deep white matter. No infarct mass or
hydrocephalus.

CT CERVICAL SPINE FINDINGS

Normal alignment. No fracture or soft tissue abnormality. Multilevel
degenerative disc disease. Lung apices clear.
IMPRESSION: Large right scalp hematoma. No acute intracranial abnormalities. No
evidence of cervical spine fracture.

## 2016-03-26 IMAGING — CT CT CERVICAL SPINE W/O CM
3 of 4 series · 13 of 33 positions shown, 16 images · non-contrast
Comparison: 12/05/2014

CLINICAL DATA: Patient fell, found on floor, large hematoma right
had

EXAM:
CT HEAD WITHOUT CONTRAST
CT CERVICAL SPINE WITHOUT CONTRAST
TECHNIQUE: Multidetector CT imaging of the head and cervical spine was
performed following the standard protocol without intravenous
contrast. Multiplanar CT image reconstructions of the cervical spine
were also generated.

[Series 3: c_spine 2.0 i30s 3 · axial · 0.33mm/px · z∈[-278,-142]mm · 5 of 103 slices shown, 7 images]
[im 18/103  soft-tissue]
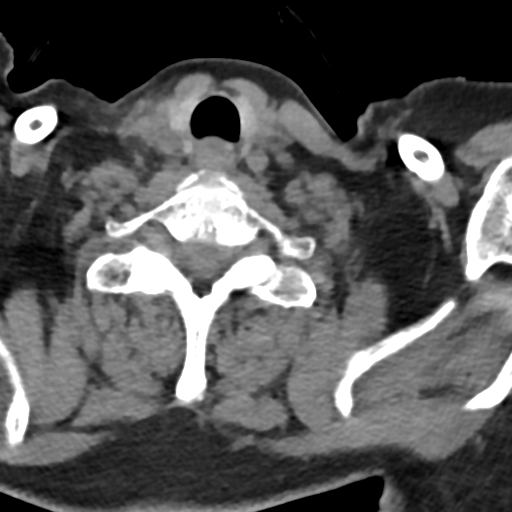
[im 18/103  bone]
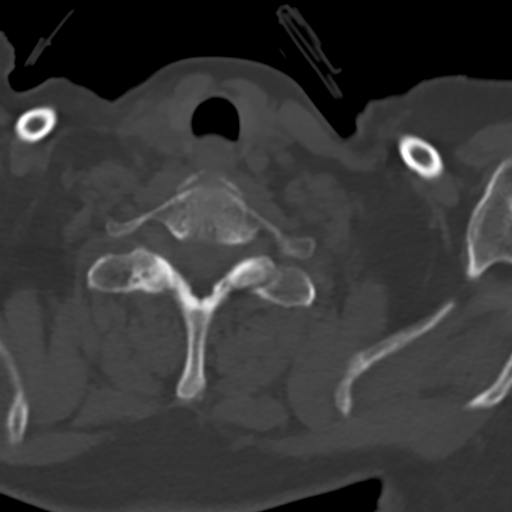
[im 35/103  bone]
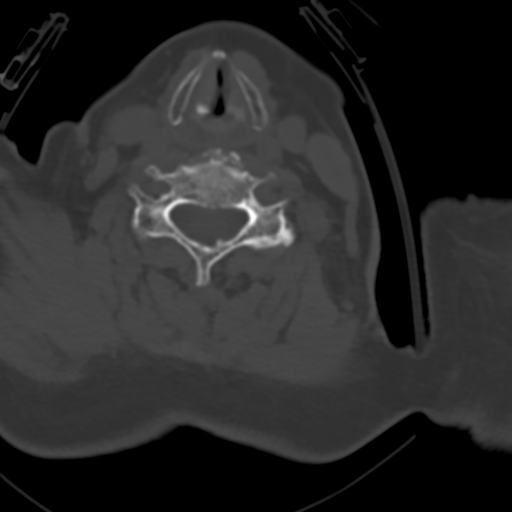
[im 52/103  bone]
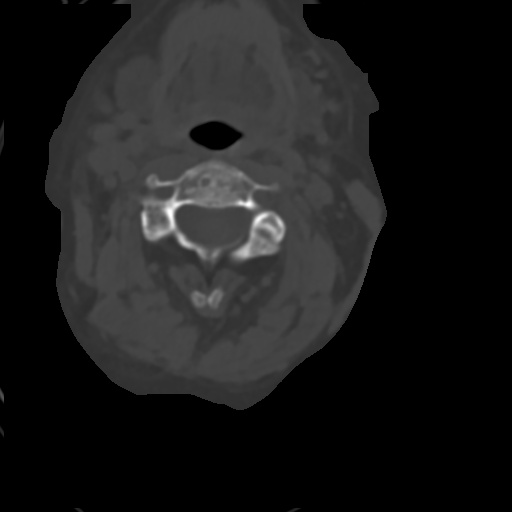
[im 69/103  bone]
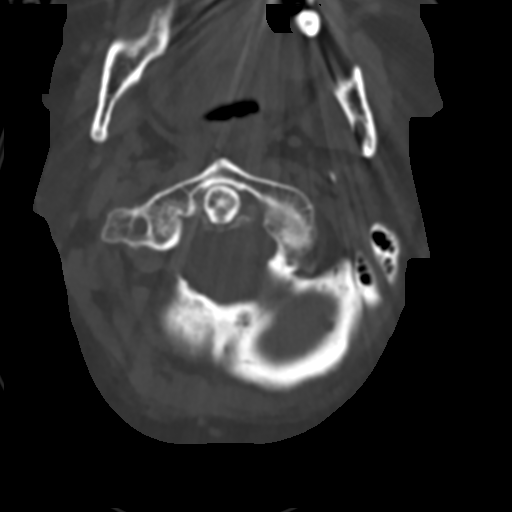
[im 86/103  soft-tissue]
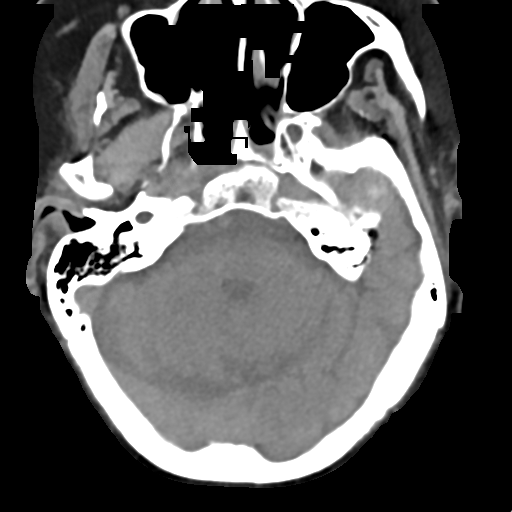
[im 86/103  bone]
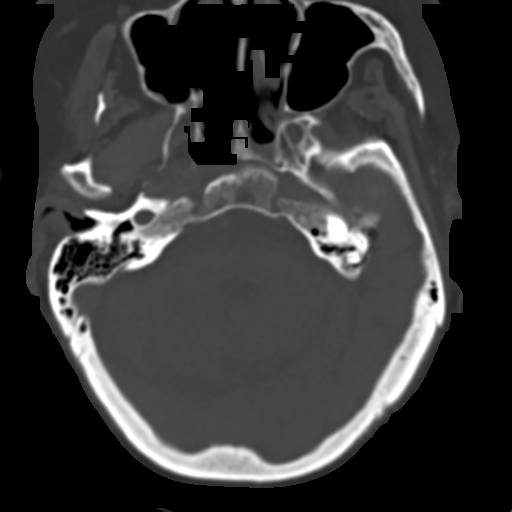

[Series 5: coronals · coronal · 0.25mm/px · 3 of 64 slices shown]
[im 13/64  bone]
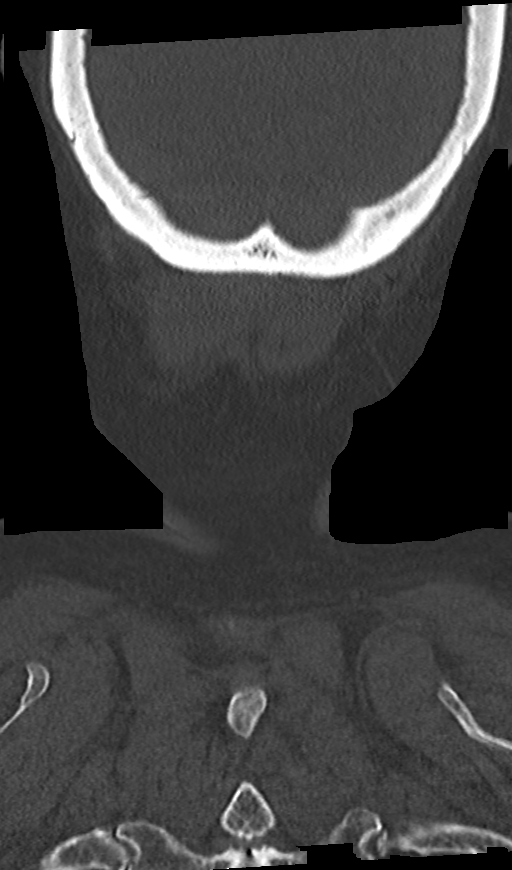
[im 26/64  bone]
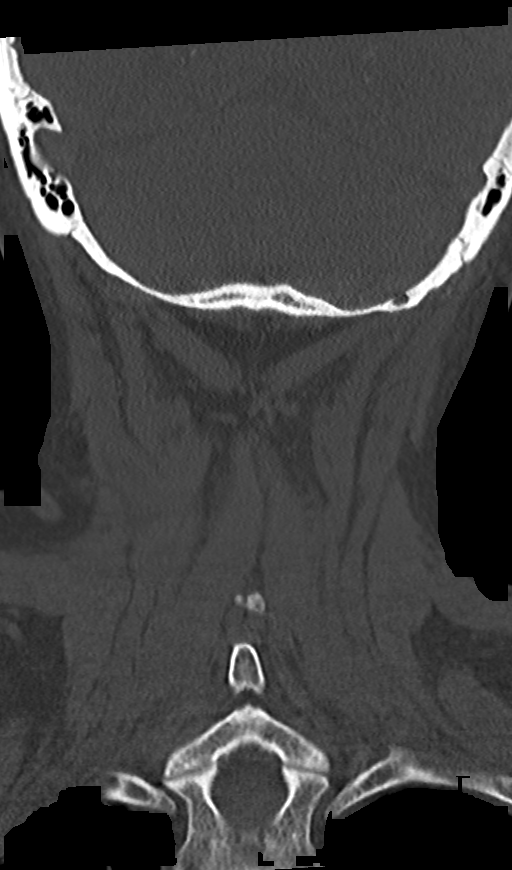
[im 38/64  bone]
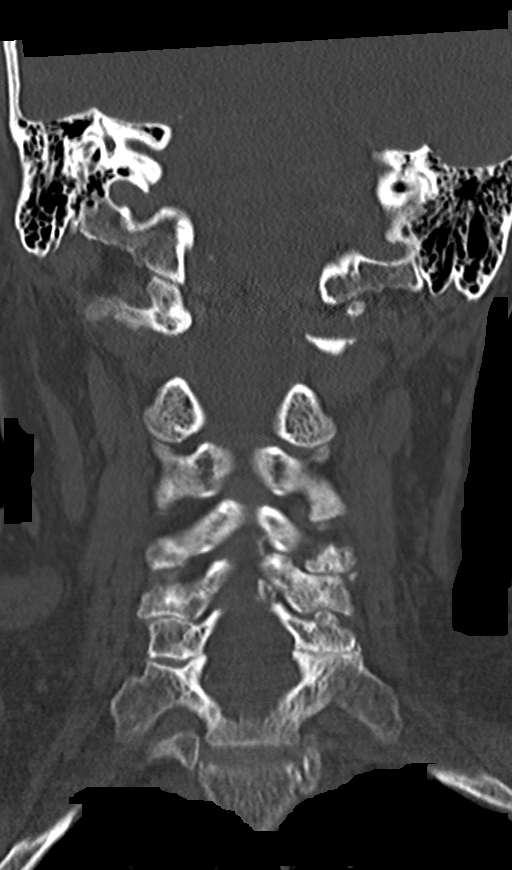

[Series 6: sagittals · sagittal · 0.32mm/px · 5 of 53 slices shown, 6 images]
[im 18/53  bone]
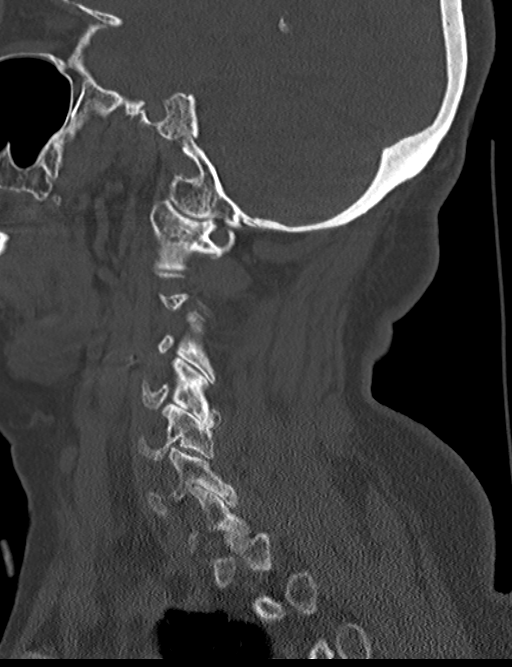
[im 22/53  bone]
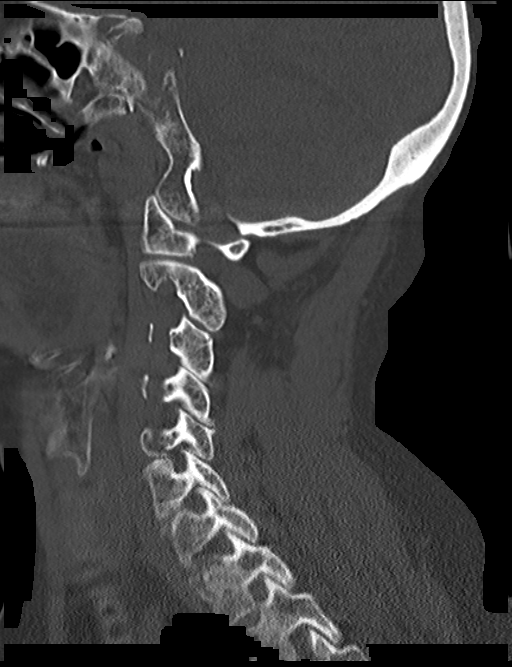
[im 27/53  soft-tissue]
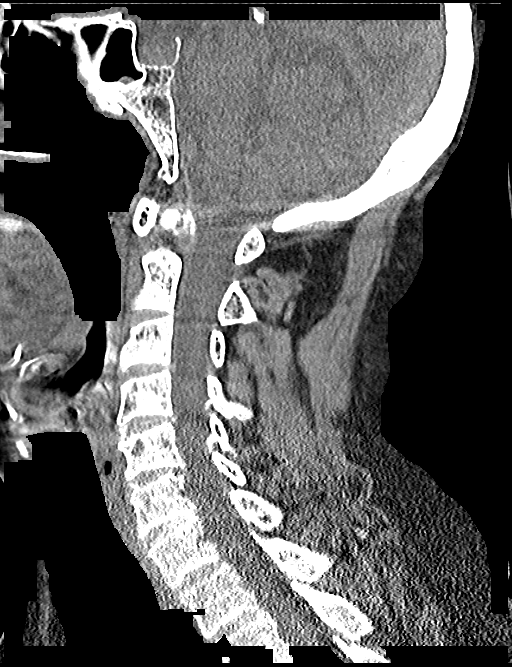
[im 27/53  bone]
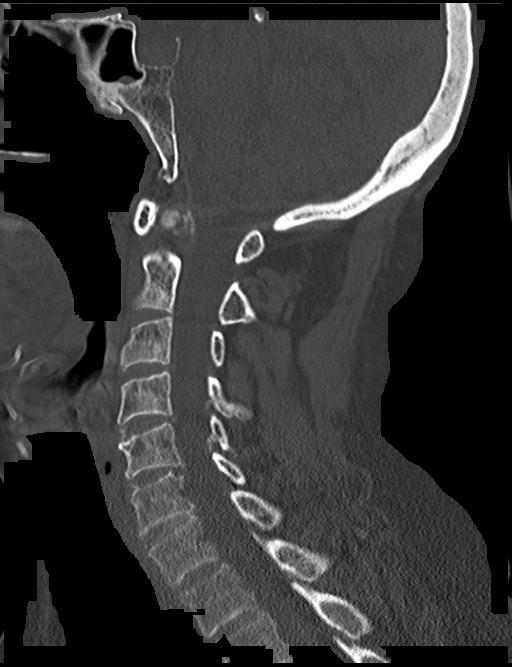
[im 31/53  bone]
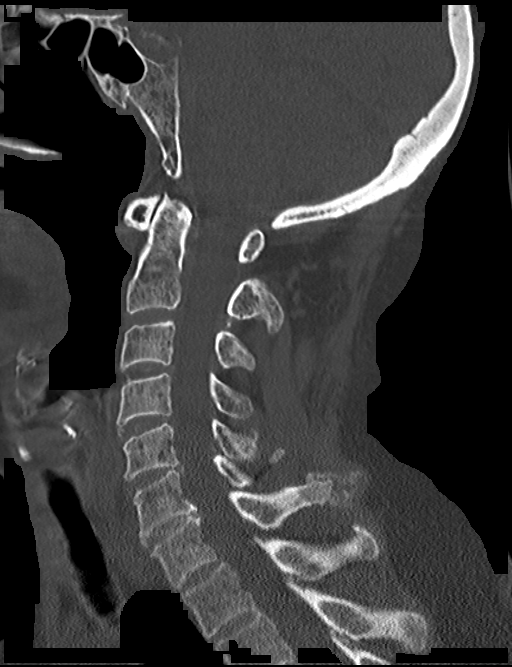
[im 35/53  bone]
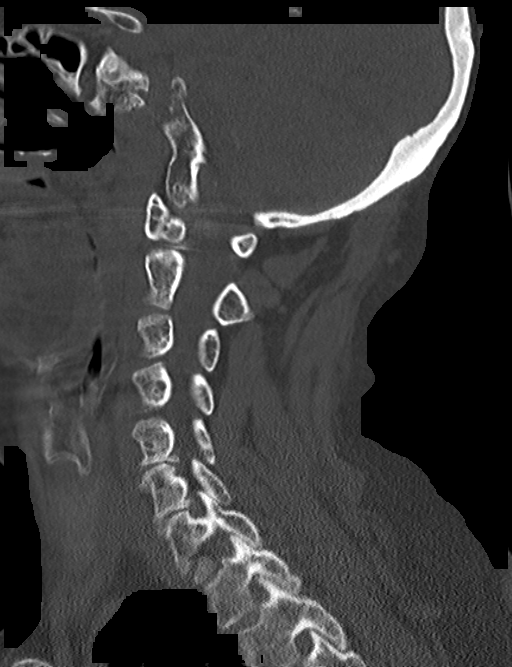

[13 of 33 positions shown; findings below may reference images not displayed]

FINDINGS: CT HEAD FINDINGS

Large right frontal temporal scalp hematoma. No skull fracture. No
intracranial hemorrhage or extra-axial fluid. Atrophy with
microvascular changes in the deep white matter. No infarct mass or
hydrocephalus.

CT CERVICAL SPINE FINDINGS

Normal alignment. No fracture or soft tissue abnormality. Multilevel
degenerative disc disease. Lung apices clear.
IMPRESSION: Large right scalp hematoma. No acute intracranial abnormalities. No
evidence of cervical spine fracture.

## 2016-03-27 ENCOUNTER — Emergency Department (HOSPITAL_COMMUNITY): Payer: Commercial Managed Care - HMO

## 2016-03-27 ENCOUNTER — Emergency Department (HOSPITAL_COMMUNITY)
Admission: EM | Admit: 2016-03-27 | Discharge: 2016-03-27 | Disposition: A | Discharge status: Home / Self Care | Payer: Commercial Managed Care - HMO | Attending: Emergency Medicine | Admitting: Emergency Medicine

## 2016-03-27 ENCOUNTER — Encounter (HOSPITAL_COMMUNITY): Payer: Self-pay | Admitting: Emergency Medicine

## 2016-03-27 DIAGNOSIS — M546 Pain in thoracic spine: Secondary | ICD-10-CM | POA: Diagnosis not present

## 2016-03-27 DIAGNOSIS — Z96641 Presence of right artificial hip joint: Secondary | ICD-10-CM | POA: Insufficient documentation

## 2016-03-27 DIAGNOSIS — Y9289 Other specified places as the place of occurrence of the external cause: Secondary | ICD-10-CM | POA: Diagnosis not present

## 2016-03-27 DIAGNOSIS — Y939 Activity, unspecified: Secondary | ICD-10-CM | POA: Insufficient documentation

## 2016-03-27 DIAGNOSIS — J45909 Unspecified asthma, uncomplicated: Secondary | ICD-10-CM | POA: Diagnosis not present

## 2016-03-27 DIAGNOSIS — Y999 Unspecified external cause status: Secondary | ICD-10-CM | POA: Diagnosis not present

## 2016-03-27 DIAGNOSIS — I129 Hypertensive chronic kidney disease with stage 1 through stage 4 chronic kidney disease, or unspecified chronic kidney disease: Secondary | ICD-10-CM | POA: Diagnosis not present

## 2016-03-27 DIAGNOSIS — E039 Hypothyroidism, unspecified: Secondary | ICD-10-CM | POA: Diagnosis not present

## 2016-03-27 DIAGNOSIS — S299XXA Unspecified injury of thorax, initial encounter: Secondary | ICD-10-CM | POA: Diagnosis not present

## 2016-03-27 DIAGNOSIS — R259 Unspecified abnormal involuntary movements: Secondary | ICD-10-CM | POA: Diagnosis not present

## 2016-03-27 DIAGNOSIS — Z87891 Personal history of nicotine dependence: Secondary | ICD-10-CM | POA: Insufficient documentation

## 2016-03-27 DIAGNOSIS — S3993XA Unspecified injury of pelvis, initial encounter: Secondary | ICD-10-CM | POA: Diagnosis not present

## 2016-03-27 DIAGNOSIS — N189 Chronic kidney disease, unspecified: Secondary | ICD-10-CM | POA: Insufficient documentation

## 2016-03-27 DIAGNOSIS — M542 Cervicalgia: Secondary | ICD-10-CM | POA: Diagnosis not present

## 2016-03-27 DIAGNOSIS — W228XXA Striking against or struck by other objects, initial encounter: Secondary | ICD-10-CM | POA: Diagnosis not present

## 2016-03-27 DIAGNOSIS — Z7982 Long term (current) use of aspirin: Secondary | ICD-10-CM | POA: Insufficient documentation

## 2016-03-27 DIAGNOSIS — M25551 Pain in right hip: Secondary | ICD-10-CM | POA: Insufficient documentation

## 2016-03-27 DIAGNOSIS — S0990XA Unspecified injury of head, initial encounter: Secondary | ICD-10-CM | POA: Diagnosis not present

## 2016-03-27 DIAGNOSIS — R51 Headache: Secondary | ICD-10-CM | POA: Diagnosis not present

## 2016-03-27 DIAGNOSIS — M545 Low back pain: Secondary | ICD-10-CM | POA: Insufficient documentation

## 2016-03-27 DIAGNOSIS — S0003XA Contusion of scalp, initial encounter: Secondary | ICD-10-CM

## 2016-03-27 DIAGNOSIS — G0430 Acute necrotizing hemorrhagic encephalopathy, unspecified: Secondary | ICD-10-CM | POA: Diagnosis not present

## 2016-03-27 DIAGNOSIS — S064X0A Epidural hemorrhage without loss of consciousness, initial encounter: Secondary | ICD-10-CM | POA: Diagnosis not present

## 2016-03-27 DIAGNOSIS — W19XXXA Unspecified fall, initial encounter: Secondary | ICD-10-CM

## 2016-03-27 IMAGING — CR DG CHEST 1V PORT
1 series · 1 of 1 positions shown · non-contrast
Comparison: 01/07/2015.

CLINICAL DATA: Altered mental status.

EXAM:
PORTABLE CHEST - 1 VIEW

[AP]
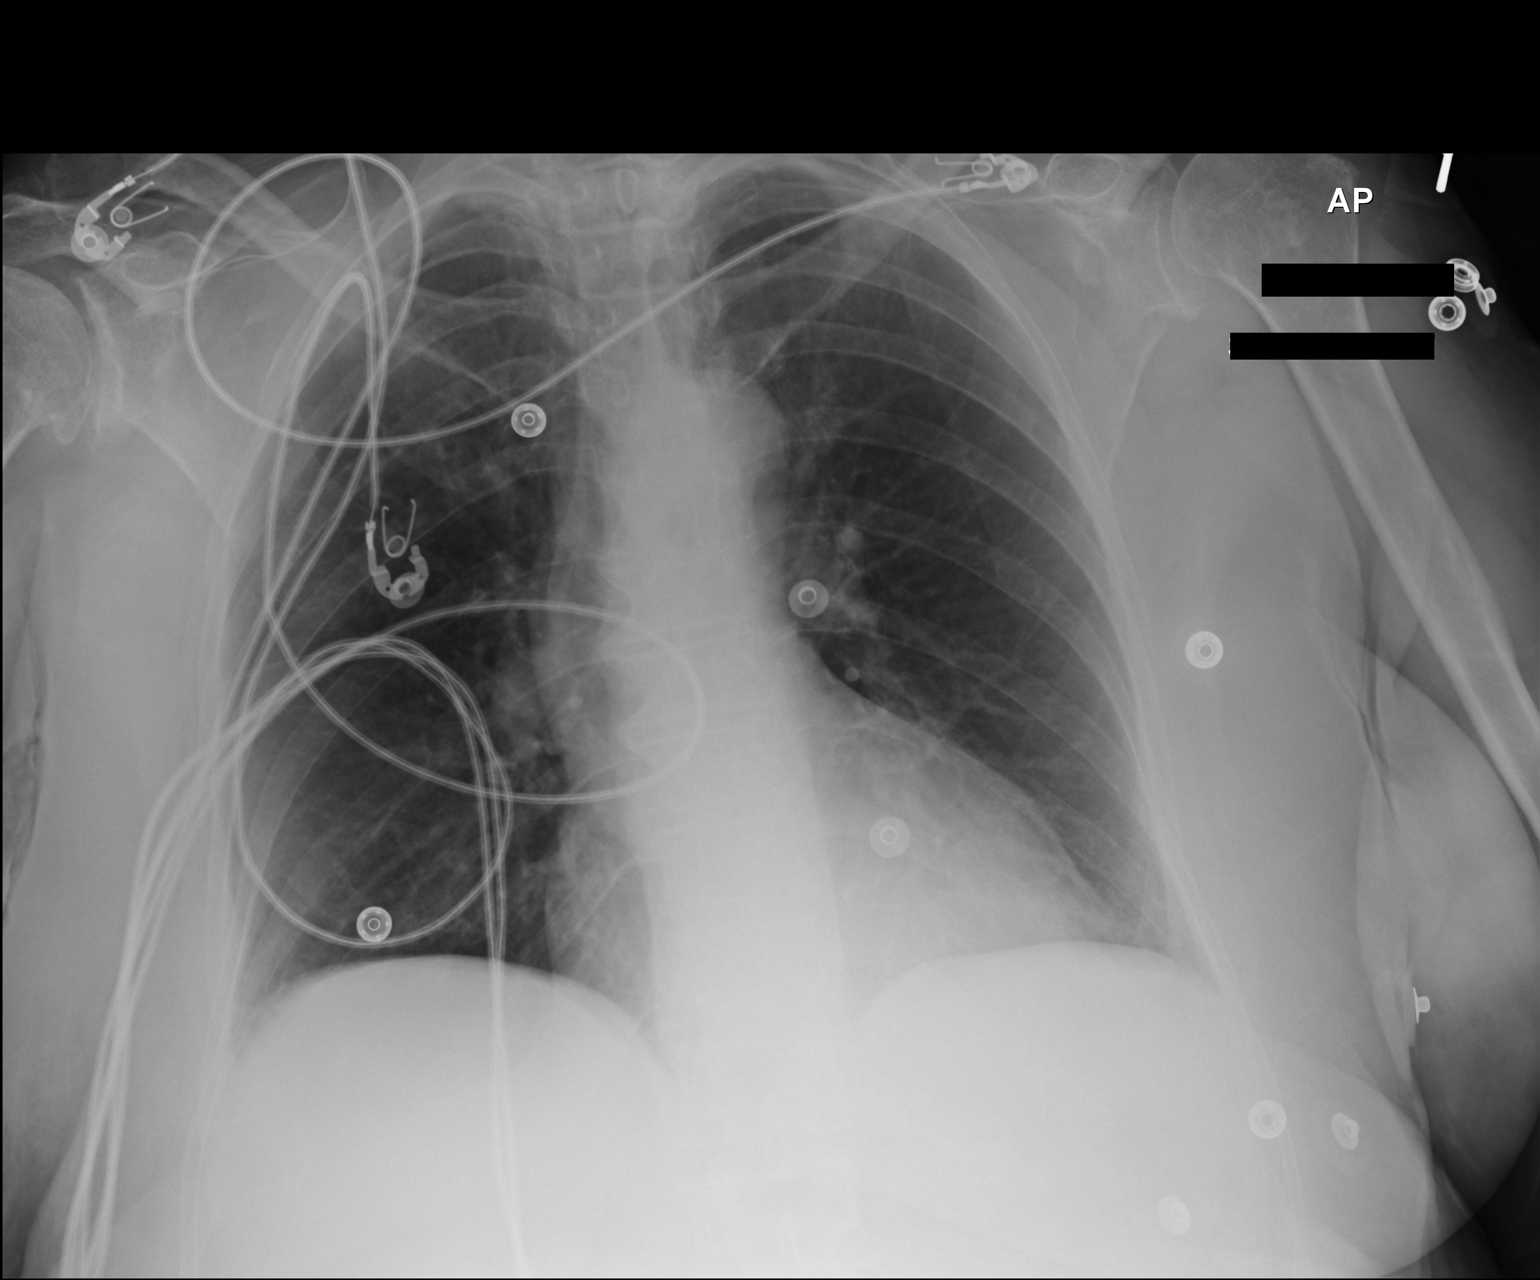

[1 of 1 positions shown; findings below may reference images not displayed]

FINDINGS: The heart size and mediastinal contours are within normal limits.
Mild left base pleural parenchymal thickening noted consistent with
mild scarring. Lungs are otherwise clear. The visualized skeletal
structures are unremarkable.
IMPRESSION: Mild left base pleural parenchymal thickening again consistent mild
scarring. No other significant abnormality .

## 2016-03-27 NOTE — ED Triage Notes (Signed)
[facility]. Stood on walker, lost balance, fell and struck top right side of head on wall. Unsure of LOC. Denies neck/back pain. Towel roll for cspine. C/o head pain. Patient alert to self and at baseline. 177/85, 72, 16 resp, 97% room air.

## 2016-03-27 NOTE — ED Provider Notes (Signed)
[facility]-EMER[facility]ENCY DEPT Provider Note   CSN: [id number] Arrival date & time: 03/27/16  1758     History   Chief Complaint Chief Complaint  Patient presents with  . Fall    HPI [name] is a 82 y.o. female with history of Alzheimer's dementia who is at baseline and only oriented to self who presents following a fall. Patient reportedly was eating dinner and got up and stood up next to her walker, lost her balance and fell and hit her head on the wall. EMS is unsure of loss of consciousness. Patient does not seem to remember all details of the incident. Patient is reporting neck pain and back pain.   Level V Caveat  The history is provided by the EMS personnel and the patient.  Fall     Past Medical History:  Diagnosis Date  . Acute bronchitis 04/10/2013  . Anemia   . Angina   . Anxiety   . Arthritis   . Arthritis 10/02/2012  . Asthma   . Chicken pox   . Chronic kidney disease   . Colon polyps   . Dehydration 08/31/2013  . Dementia   . Depression   . Dysrhythmia   . Headache(784.0)   . Heart murmur   . Hypertension   . Hypothyroidism   . Left leg pain 10/02/2012  . Pneumonia   . Recurrent upper respiratory infection (URI)   . Shortness of breath   . UTI (urinary tract infection) 12/30/2012  . Valvular heart disease 09/04/2012    Patient Active Problem List   Diagnosis Date Noted  . Pressure ulcer 09/20/2015  . Displaced fracture of right femoral neck (HCC) 09/19/2015  . Altered mental status   . Confusion 06/26/2015  . Fall   . Aspiration pneumonia (HCC) 01/10/2015  . Acute encephalopathy 01/08/2015  . UTI (urinary tract infection) 01/08/2015  . 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 Recurrent falls 11/27/2014  . FTT (failure to thrive) in adult 10/26/2014  . Dehydration 10/26/2014  . Abnormal urinalysis 10/26/2014  . Normocytic anemia 10/26/2014  . Lump of skin of left upper extremity 09/07/2014  . Rash and nonspecific skin eruption  05/08/2014  . Syncope and collapse 11/30/2013  . Low back pain, episodic 11/20/2013  . Chronic kidney disease   . Insomnia 08/31/2013  . Arthritis 10/02/2012  . Valvular heart disease 09/04/2012  . Restless leg syndrome 06/06/2012  . Trochanteric bursitis 08/02/2011  . Dementia 08/02/2011  . Leg pain 03/28/2011  . Hypertension 09/22/2010  . Hypothyroidism 09/22/2010    Past Surgical History:  Procedure Laterality Date  . ABDOMINAL HYSTERECTOMY    . APPENDECTOMY    . BACK SURGERY    . CHOLECYSTECTOMY    . COLONOSCOPY W/ POLYPECTOMY    . EYE SURGERY     cataract removed and eye lids lifted  . feet surgery    . FRACTURE SURGERY     bilateral arms  . HAND SURGERY    . HIP ARTHROPLASTY Right 09/20/2015   Procedure: ARTHROPLASTY RIGHT  BIPOLAR ANTERIOR HIP (HEMIARTHROPLASTY);  Surgeon: Samson Frederic, MD;  Location: [facility];  Service: Orthopedics;  Laterality: Right;  . kidney stones    . SHOULDER ARTHROSCOPY    . TONSILLECTOMY    . TONSILLECTOMY    . TUBAL LIGATION      OB History    No data available       Home Medications    Prior to Admission medications   Medication  Sig Start Date End Date Taking? Authorizing Provider  acetaminophen (TYLENOL) 500 MG tablet Take 500 mg by mouth every 4 (four) hours as needed for mild pain, moderate pain or fever.    Historical Provider, MD  alum & mag hydroxide-simeth (MINTOX) 200-200-20 MG/5ML suspension Take 30 mLs by mouth 4 (four) times daily as needed for indigestion or heartburn.     Historical Provider, MD  aspirin 81 MG chewable tablet Chew 81 mg by mouth daily.    Historical Provider, MD  aspirin EC 325 MG EC tablet Take 1 tablet (325 mg total) by mouth 2 (two) times daily after a meal. To be taken for 30 days postop (surgery date: 09/20/15). After the 30 days period, change to prior dose of 81 mg daily. Patient not taking: Reported on 11/11/2015 09/23/15   Elease EtienneAnand D Hongalgi, MD  cephALEXin (KEFLEX) 500 MG capsule Take 1 capsule  (500 mg total) by mouth 3 (three) times daily. 01/09/16   Donnetta HutchingBrian Cook, MD  clonazePAM (KLONOPIN) 0.5 MG tablet Take 1 tablet (0.5 mg total) by mouth 2 (two) times daily. Patient not taking: Reported on 01/09/2016 09/23/15   Elease EtienneAnand D Hongalgi, MD  divalproex (DEPAKOTE SPRINKLE) 125 MG capsule Take 125 mg by mouth every morning.    Historical Provider, MD  escitalopram (LEXAPRO) 10 MG tablet Take 10 mg by mouth daily.    Historical Provider, MD  guaifenesin (ROBITUSSIN) 100 MG/5ML syrup Take 200 mg by mouth 2 (two) times daily as needed for cough.    Historical Provider, MD  levothyroxine (SYNTHROID, LEVOTHROID) 50 MCG tablet TAKE 1 TABLET (50 MCG TOTAL) BY MOUTH DAILY. 09/25/14   Myrlene BrokerElizabeth A Crawford, MD  loperamide (IMODIUM) 2 MG capsule Take 2 mg by mouth as needed for diarrhea or loose stools (do not exceed 8 doses in 24 hours.).     Historical Provider, MD  magnesium hydroxide (MILK OF MAGNESIA) 400 MG/5ML suspension Take 30 mLs by mouth at bedtime as needed for mild constipation.    Historical Provider, MD  Multiple Vitamins-Minerals (MULTIVITAMIN WITH MINERALS) tablet Take 1 tablet by mouth daily.    Historical Provider, MD  neomycin-bacitracin-polymyxin (NEOSPORIN) ointment Apply 1 application topically daily as needed for wound care.     Historical Provider, MD  PRESCRIPTION MEDICATION Take 1 each by mouth 3 (three) times daily. Mighty Shakes    Historical Provider, MD  rivastigmine (EXELON) 3 MG capsule Take 3 mg by mouth 2 (two) times daily.    Historical Provider, MD  senna-docusate (SENEXON-S) 8.6-50 MG tablet Take 2 tablets by mouth 2 (two) times daily.     Historical Provider, MD  traMADol (ULTRAM) 50 MG tablet Take 1 tablet (50 mg total) by mouth every 6 (six) hours as needed for severe pain. 09/23/15   Elease EtienneAnand D Hongalgi, MD  traZODone (DESYREL) 50 MG tablet Take 50 mg by mouth at bedtime.     Historical Provider, MD    Family History Family History  Problem Relation Age of Onset  . Heart  disease Mother   . Heart disease Father   . Heart disease Other   . Birth defects Other     Social History Social History  Substance Use Topics  . Smoking status: Former Smoker    Packs/day: 0.20    Years: 1.00    Types: Cigarettes    Quit date: 06/02/1941  . Smokeless tobacco: Never Used  . Alcohol use No     Allergies   Morphine and related; Codeine; and Hydrocodone  Review of Systems Review of Systems  Unable to perform ROS: Dementia     Physical Exam Updated Vital Signs BP 167/64 (BP Location: Right Arm)   Pulse 71   Temp 97.8 F (36.6 C) (Oral)   Resp 14   SpO2 98%   Physical Exam  Constitutional: She appears well-developed and well-nourished. No distress.  HENT:  Head: Normocephalic and atraumatic.    Mouth/Throat: Oropharynx is clear and moist. No oropharyngeal exudate.  Eyes: Conjunctivae and EOM are normal. Pupils are equal, round, and reactive to light. Right eye exhibits no discharge. Left eye exhibits no discharge. No scleral icterus.  Neck: Normal range of motion. Neck supple. Spinous process tenderness and muscular tenderness present. No thyromegaly present.    Cardiovascular: Normal rate, regular rhythm, normal heart sounds and intact distal pulses.  Exam reveals no gallop and no friction rub.   No murmur heard. Pulmonary/Chest: Effort normal and breath sounds normal. No stridor. No respiratory distress. She has no wheezes. She has no rales.  Abdominal: Soft. Bowel sounds are normal. She exhibits no distension. There is no tenderness. There is no rebound and no guarding.  Musculoskeletal: She exhibits no edema.       Right hip: She exhibits bony tenderness.       Thoracic back: She exhibits tenderness and bony tenderness.       Lumbar back: She exhibits tenderness and bony tenderness.       Back:       Legs: Lymphadenopathy:    She has no cervical adenopathy.  Neurological: She is alert. Coordination normal.  CN 3-12 intact; normal  sensation throughout; 5/5 strength in all 4 extremities; equal bilateral grip strength   Skin: Skin is warm and dry. No rash noted. She is not diaphoretic. No pallor.  Psychiatric: She has a normal mood and affect.  Nursing note and vitals reviewed.    ED Treatments / Results  Labs (all labs ordered are listed, but only abnormal results are displayed) Labs Reviewed - No data to display  EKG  EKG Interpretation None       Radiology Dg Thoracic Spine 2 View  Result Date: 03/27/2016 CLINICAL DATA:  Status post fall today. EXAM: THORACIC SPINE 2 VIEWS COMPARISON:  Chest x-ray February 06, 2015 FINDINGS: There is no evidence of thoracic spine fracture. Alignment is normal. Degenerative joint changes of the spine are identified. IMPRESSION: Negative. Electronically Signed   By: Sherian Rein M.D.   On: 03/27/2016 19:41   Dg Lumbar Spine Complete  Result Date: 03/27/2016 CLINICAL DATA:  Status post fall today with back pain. EXAM: LUMBAR SPINE - COMPLETE 4+ VIEW COMPARISON:  January 07, 2015 FINDINGS: There is no evidence of lumbar spine fracture. Alignment is normal. There are degenerative joint changes with narrowed joint space and osteophyte formation. IMPRESSION: No acute fracture or dislocation. Degenerative joint changes of lumbar spine. Electronically Signed   By: Sherian Rein M.D.   On: 03/27/2016 19:42   Dg Pelvis 1-2 Views  Result Date: 03/27/2016 CLINICAL DATA:  Status post fall today. EXAM: PELVIS - 1-2 VIEW COMPARISON:  September 20, 2015 FINDINGS: There is no evidence of pelvic fracture or diastasis. Degenerative joint changes of the left hip is identified. Right hip hemi arthropod plasty is noted without dislocation. IMPRESSION: No acute abnormality. Electronically Signed   By: Sherian Rein M.D.   On: 03/27/2016 19:40   Ct Head Wo Contrast  Result Date: 03/27/2016 CLINICAL DATA:  82 year old female post fall. Frontal headache  and minor neck pain. Initial encounter. EXAM: CT  HEAD WITHOUT CONTRAST CT CERVICAL SPINE WITHOUT CONTRAST TECHNIQUE: Multidetector CT imaging of the head and cervical spine was performed following the standard protocol without intravenous contrast. Multiplanar CT image reconstructions of the cervical spine were also generated. COMPARISON:  01/09/2016 head CT. 06/08/2015 head CT and cervical spine CT. FINDINGS: CT HEAD FINDINGS Brain: No intracranial hemorrhage or CT evidence of large acute infarct. Chronic microvascular changes. Moderate global atrophy without hydrocephalus. No intracranial mass lesion noted on this unenhanced exam. Vascular: Vascular calcifications. Skull: No skull fracture. Sinuses/Orbits: No acute orbital injury. Mucosal thickening left sphenoid sinus. Other: Right parietal scalp hematoma may be present. CT CERVICAL SPINE FINDINGS Alignment: Relatively normal. Skull base and vertebrae: No cervical spine fracture. Soft tissues and spinal canal: No abnormal prevertebral soft tissue swelling. No neck mass identified. Cervical spondylotic changes with various degrees of spinal stenosis and foraminal narrowing without significant or perforation. Upper chest: Scarring lung apices. Other: Negative. IMPRESSION: CT HEAD No skull fracture or intracranial hemorrhage. Chronic microvascular changes. Moderate global atrophy. Mucosal thickening left sphenoid sinus. Right parietal scalp hematoma may be present. CT CERVICAL SPINE No cervical spine fracture or abnormal alignment. No abnormal prevertebral soft tissue swelling. Electronically Signed   By: Lacy Duverney M.D.   On: 03/27/2016 19:33   Ct Cervical Spine Wo Contrast  Result Date: 03/27/2016 CLINICAL DATA:  82 year old female post fall. Frontal headache and minor neck pain. Initial encounter. EXAM: CT HEAD WITHOUT CONTRAST CT CERVICAL SPINE WITHOUT CONTRAST TECHNIQUE: Multidetector CT imaging of the head and cervical spine was performed following the standard protocol without intravenous contrast.  Multiplanar CT image reconstructions of the cervical spine were also generated. COMPARISON:  01/09/2016 head CT. 06/08/2015 head CT and cervical spine CT. FINDINGS: CT HEAD FINDINGS Brain: No intracranial hemorrhage or CT evidence of large acute infarct. Chronic microvascular changes. Moderate global atrophy without hydrocephalus. No intracranial mass lesion noted on this unenhanced exam. Vascular: Vascular calcifications. Skull: No skull fracture. Sinuses/Orbits: No acute orbital injury. Mucosal thickening left sphenoid sinus. Other: Right parietal scalp hematoma may be present. CT CERVICAL SPINE FINDINGS Alignment: Relatively normal. Skull base and vertebrae: No cervical spine fracture. Soft tissues and spinal canal: No abnormal prevertebral soft tissue swelling. No neck mass identified. Cervical spondylotic changes with various degrees of spinal stenosis and foraminal narrowing without significant or perforation. Upper chest: Scarring lung apices. Other: Negative. IMPRESSION: CT HEAD No skull fracture or intracranial hemorrhage. Chronic microvascular changes. Moderate global atrophy. Mucosal thickening left sphenoid sinus. Right parietal scalp hematoma may be present. CT CERVICAL SPINE No cervical spine fracture or abnormal alignment. No abnormal prevertebral soft tissue swelling. Electronically Signed   By: Lacy Duverney M.D.   On: 03/27/2016 19:33    Procedures Procedures (including critical care time)  Medications Ordered in ED Medications - No data to display   Initial Impression / Assessment and Plan / ED Course  I have reviewed the triage vital signs and the nursing notes.  Pertinent labs & imaging results that were available during my care of the patient were reviewed by me and considered in my medical decision making (see chart for details).  Clinical Course    Negative x-rays of pelvis, thoracic, lumbar spine. CT head shows no fracture or hemorrhage; chronic microvascular changes;  moderate global atrophy; mucosal thickening left sinusitis; right parietal scalp hematoma may be present. CT C-spine shows no fracture or abnormal alignment; no prevertebral soft tissue swelling. Supportive treatment such as  ice and Tylenol. Patient has been reasonably screened and myself and Dr. Juleen China believe patient is safe for transport back to nursing facility. Patient vitals stable throughout ED course and discharged in satisfactory condition.  Final Clinical Impressions(s) / ED Diagnoses   Final diagnoses:  Fall, initial encounter  Hematoma of right parietal scalp, initial encounter    New Prescriptions New Prescriptions   No medications on file     Verdis Prime 03/27/16 2017    Raeford Razor, MD 03/29/16 1904

## 2016-03-27 NOTE — ED Notes (Signed)
Pt in radiology 

## 2016-03-27 NOTE — Discharge Instructions (Signed)
Treatment: You can treat pain with Tylenol as prescribed over-the-counter. Use ice 3-4 times daily on scalp hematoma, alternating 20 minutes on, 20 minutes off.  Follow-up: Please follow-up with PCP in the next 3-4 days for further evaluation and treatment of symptoms. Please return to the emergency department if you develop any new or worsening symptoms.

## 2016-03-28 DIAGNOSIS — G0430 Acute necrotizing hemorrhagic encephalopathy, unspecified: Secondary | ICD-10-CM | POA: Diagnosis not present

## 2016-03-29 DIAGNOSIS — G0430 Acute necrotizing hemorrhagic encephalopathy, unspecified: Secondary | ICD-10-CM | POA: Diagnosis not present

## 2016-03-29 IMAGING — CR DG CHEST 1V PORT
1 series · 1 of 1 positions shown · non-contrast
Comparison: January 08, 2015.

CLINICAL DATA: Aspiration pneumonia.

EXAM:
PORTABLE CHEST - 1 VIEW

[AP]
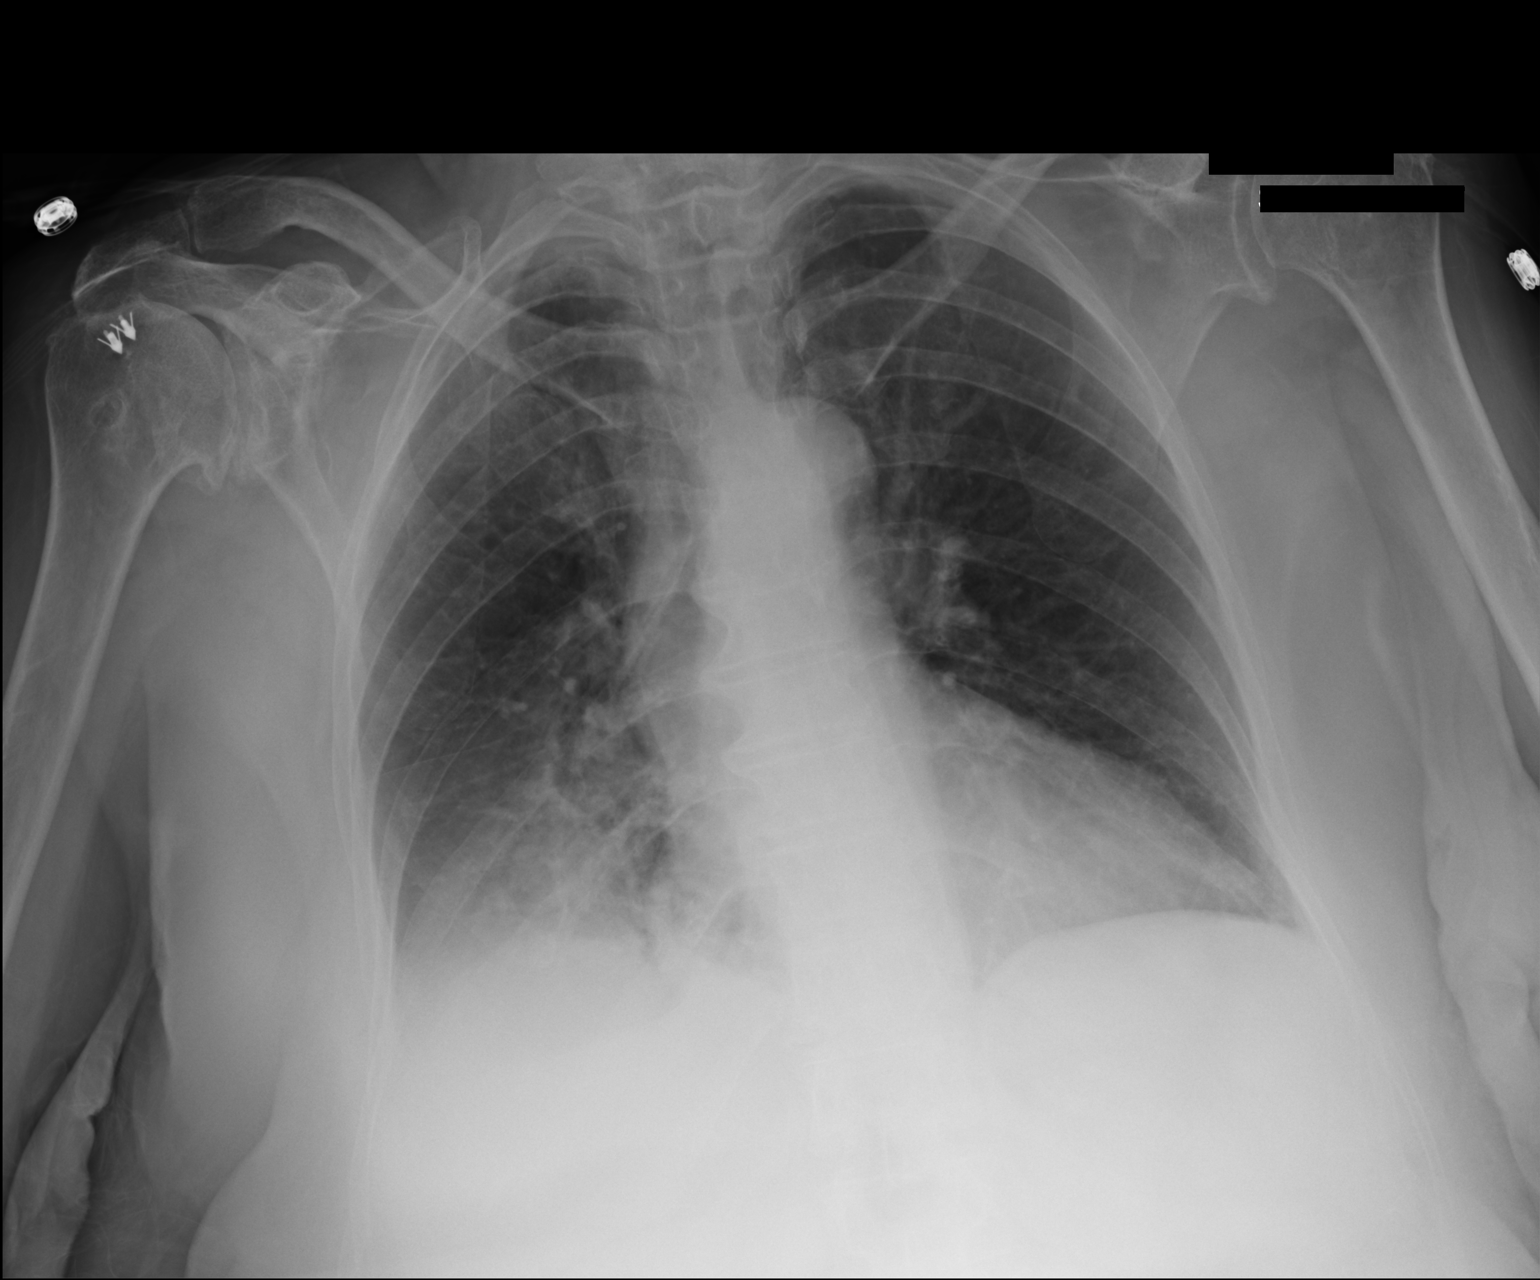

[1 of 1 positions shown; findings below may reference images not displayed]

FINDINGS: The heart size and mediastinal contours are within normal limits. No
pneumothorax is noted. Severe degenerative change of right
glenohumeral joint is noted. Left lung is clear. New right basilar
opacity is noted concerning for aspiration pneumonia with possible
associated pleural effusion.
IMPRESSION: New right lower lobe opacity is noted consistent with aspiration
pneumonia.

## 2016-03-30 DIAGNOSIS — G0430 Acute necrotizing hemorrhagic encephalopathy, unspecified: Secondary | ICD-10-CM | POA: Diagnosis not present

## 2016-03-30 IMAGING — CR DG CHEST 1V PORT SAME DAY
1 series · 1 of 1 positions shown · non-contrast
Comparison: Single view of the chest 01/10/2015 and 01/08/2015.

CLINICAL DATA: Patient diagnosed with pneumonia 01/10/2015. Cough
and congestion.

EXAM:
PORTABLE CHEST - 1 VIEW SAME DAY

[AP]
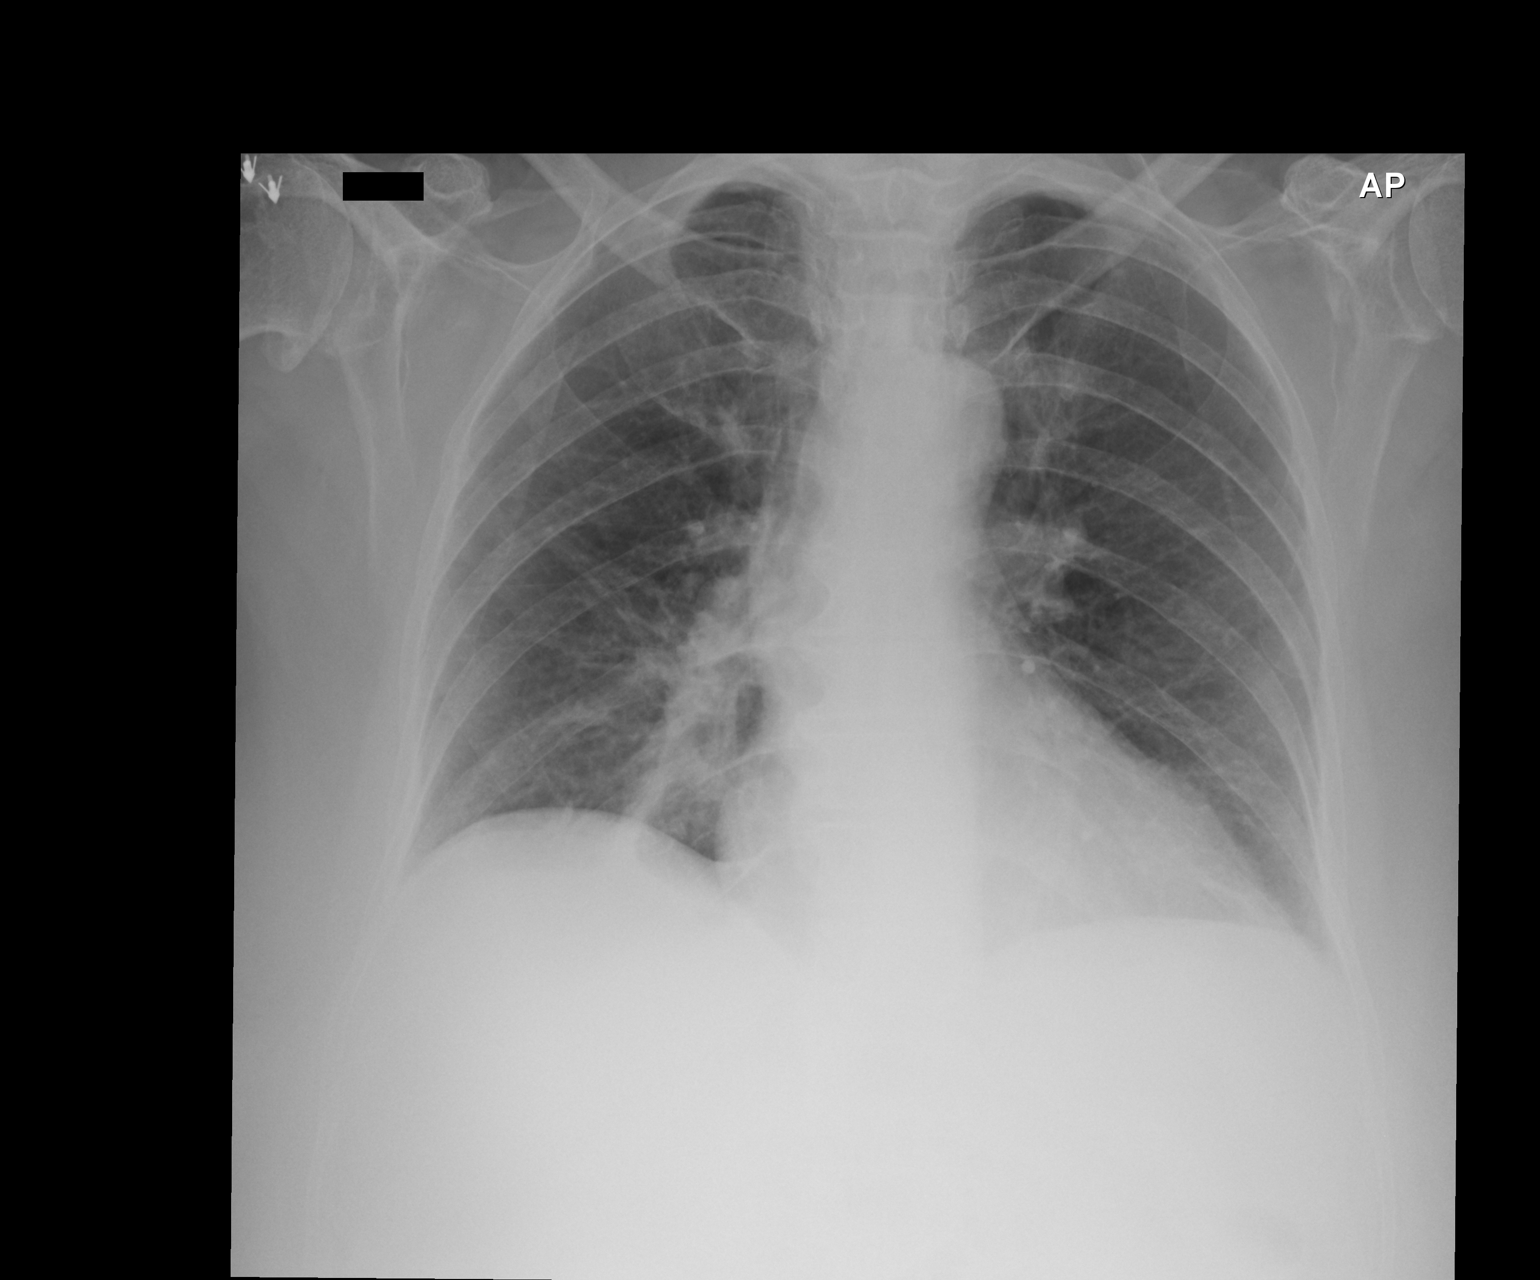

[1 of 1 positions shown; findings below may reference images not displayed]

FINDINGS: Aeration in the right lung base has markedly improved compared to
yesterday's examination. The left lung appears clear. No
pneumothorax or pleural effusion is identified. Heart size is
normal. No focal bony abnormality seen. Marked right glenohumeral
osteoarthritis and postoperative change of rotator cuff repair
noted.
IMPRESSION: Marked improvement in right basilar airspace disease. No new
abnormality.

## 2016-03-31 DIAGNOSIS — G0430 Acute necrotizing hemorrhagic encephalopathy, unspecified: Secondary | ICD-10-CM | POA: Diagnosis not present

## 2016-03-31 DIAGNOSIS — N390 Urinary tract infection, site not specified: Secondary | ICD-10-CM | POA: Diagnosis not present

## 2016-03-31 DIAGNOSIS — F0281 Dementia in other diseases classified elsewhere with behavioral disturbance: Secondary | ICD-10-CM | POA: Diagnosis not present

## 2016-03-31 DIAGNOSIS — G301 Alzheimer's disease with late onset: Secondary | ICD-10-CM | POA: Diagnosis not present

## 2016-03-31 DIAGNOSIS — R296 Repeated falls: Secondary | ICD-10-CM | POA: Diagnosis not present

## 2016-04-01 DIAGNOSIS — G0430 Acute necrotizing hemorrhagic encephalopathy, unspecified: Secondary | ICD-10-CM | POA: Diagnosis not present

## 2016-04-02 DIAGNOSIS — G0430 Acute necrotizing hemorrhagic encephalopathy, unspecified: Secondary | ICD-10-CM | POA: Diagnosis not present

## 2016-04-03 DIAGNOSIS — G0430 Acute necrotizing hemorrhagic encephalopathy, unspecified: Secondary | ICD-10-CM | POA: Diagnosis not present

## 2016-04-04 DIAGNOSIS — G0430 Acute necrotizing hemorrhagic encephalopathy, unspecified: Secondary | ICD-10-CM | POA: Diagnosis not present

## 2016-04-04 DIAGNOSIS — R32 Unspecified urinary incontinence: Secondary | ICD-10-CM | POA: Diagnosis not present

## 2016-04-05 DIAGNOSIS — G0430 Acute necrotizing hemorrhagic encephalopathy, unspecified: Secondary | ICD-10-CM | POA: Diagnosis not present

## 2016-04-06 DIAGNOSIS — G0430 Acute necrotizing hemorrhagic encephalopathy, unspecified: Secondary | ICD-10-CM | POA: Diagnosis not present

## 2016-04-07 DIAGNOSIS — G0430 Acute necrotizing hemorrhagic encephalopathy, unspecified: Secondary | ICD-10-CM | POA: Diagnosis not present

## 2016-04-08 DIAGNOSIS — G0430 Acute necrotizing hemorrhagic encephalopathy, unspecified: Secondary | ICD-10-CM | POA: Diagnosis not present

## 2016-04-09 DIAGNOSIS — G0430 Acute necrotizing hemorrhagic encephalopathy, unspecified: Secondary | ICD-10-CM | POA: Diagnosis not present

## 2016-04-10 DIAGNOSIS — G0430 Acute necrotizing hemorrhagic encephalopathy, unspecified: Secondary | ICD-10-CM | POA: Diagnosis not present

## 2016-04-11 DIAGNOSIS — G0430 Acute necrotizing hemorrhagic encephalopathy, unspecified: Secondary | ICD-10-CM | POA: Diagnosis not present

## 2016-04-12 DIAGNOSIS — G0430 Acute necrotizing hemorrhagic encephalopathy, unspecified: Secondary | ICD-10-CM | POA: Diagnosis not present

## 2016-04-13 DIAGNOSIS — G0430 Acute necrotizing hemorrhagic encephalopathy, unspecified: Secondary | ICD-10-CM | POA: Diagnosis not present

## 2016-04-14 DIAGNOSIS — G0430 Acute necrotizing hemorrhagic encephalopathy, unspecified: Secondary | ICD-10-CM | POA: Diagnosis not present

## 2016-04-15 DIAGNOSIS — F0281 Dementia in other diseases classified elsewhere with behavioral disturbance: Secondary | ICD-10-CM | POA: Diagnosis not present

## 2016-04-15 DIAGNOSIS — G0430 Acute necrotizing hemorrhagic encephalopathy, unspecified: Secondary | ICD-10-CM | POA: Diagnosis not present

## 2016-04-15 DIAGNOSIS — G309 Alzheimer's disease, unspecified: Secondary | ICD-10-CM | POA: Diagnosis not present

## 2016-04-15 DIAGNOSIS — F411 Generalized anxiety disorder: Secondary | ICD-10-CM | POA: Diagnosis not present

## 2016-04-16 ENCOUNTER — Emergency Department (HOSPITAL_COMMUNITY)
Admission: EM | Admit: 2016-04-16 | Discharge: 2016-04-17 | Disposition: A | Discharge status: Home / Self Care | Payer: Commercial Managed Care - HMO | Attending: Emergency Medicine | Admitting: Emergency Medicine

## 2016-04-16 DIAGNOSIS — M25512 Pain in left shoulder: Secondary | ICD-10-CM | POA: Diagnosis not present

## 2016-04-16 DIAGNOSIS — E039 Hypothyroidism, unspecified: Secondary | ICD-10-CM | POA: Insufficient documentation

## 2016-04-16 DIAGNOSIS — Y939 Activity, unspecified: Secondary | ICD-10-CM | POA: Insufficient documentation

## 2016-04-16 DIAGNOSIS — S7002XA Contusion of left hip, initial encounter: Secondary | ICD-10-CM | POA: Diagnosis not present

## 2016-04-16 DIAGNOSIS — W19XXXA Unspecified fall, initial encounter: Secondary | ICD-10-CM | POA: Insufficient documentation

## 2016-04-16 DIAGNOSIS — S79922A Unspecified injury of left thigh, initial encounter: Secondary | ICD-10-CM | POA: Diagnosis not present

## 2016-04-16 DIAGNOSIS — Z96641 Presence of right artificial hip joint: Secondary | ICD-10-CM | POA: Insufficient documentation

## 2016-04-16 DIAGNOSIS — S8991XA Unspecified injury of right lower leg, initial encounter: Secondary | ICD-10-CM | POA: Diagnosis not present

## 2016-04-16 DIAGNOSIS — M79662 Pain in left lower leg: Secondary | ICD-10-CM | POA: Diagnosis not present

## 2016-04-16 DIAGNOSIS — Z87891 Personal history of nicotine dependence: Secondary | ICD-10-CM | POA: Insufficient documentation

## 2016-04-16 DIAGNOSIS — Z79899 Other long term (current) drug therapy: Secondary | ICD-10-CM | POA: Diagnosis not present

## 2016-04-16 DIAGNOSIS — I129 Hypertensive chronic kidney disease with stage 1 through stage 4 chronic kidney disease, or unspecified chronic kidney disease: Secondary | ICD-10-CM | POA: Diagnosis not present

## 2016-04-16 DIAGNOSIS — Y929 Unspecified place or not applicable: Secondary | ICD-10-CM | POA: Diagnosis not present

## 2016-04-16 DIAGNOSIS — S8992XA Unspecified injury of left lower leg, initial encounter: Secondary | ICD-10-CM | POA: Diagnosis not present

## 2016-04-16 DIAGNOSIS — N189 Chronic kidney disease, unspecified: Secondary | ICD-10-CM | POA: Diagnosis not present

## 2016-04-16 DIAGNOSIS — T148XXA Other injury of unspecified body region, initial encounter: Secondary | ICD-10-CM | POA: Diagnosis not present

## 2016-04-16 DIAGNOSIS — J45909 Unspecified asthma, uncomplicated: Secondary | ICD-10-CM | POA: Insufficient documentation

## 2016-04-16 DIAGNOSIS — Y999 Unspecified external cause status: Secondary | ICD-10-CM | POA: Insufficient documentation

## 2016-04-16 DIAGNOSIS — M25462 Effusion, left knee: Secondary | ICD-10-CM | POA: Diagnosis not present

## 2016-04-16 DIAGNOSIS — G309 Alzheimer's disease, unspecified: Secondary | ICD-10-CM | POA: Insufficient documentation

## 2016-04-16 DIAGNOSIS — G0430 Acute necrotizing hemorrhagic encephalopathy, unspecified: Secondary | ICD-10-CM | POA: Diagnosis not present

## 2016-04-16 DIAGNOSIS — M25551 Pain in right hip: Secondary | ICD-10-CM | POA: Diagnosis not present

## 2016-04-16 DIAGNOSIS — S0990XA Unspecified injury of head, initial encounter: Secondary | ICD-10-CM | POA: Diagnosis not present

## 2016-04-16 DIAGNOSIS — S199XXA Unspecified injury of neck, initial encounter: Secondary | ICD-10-CM | POA: Diagnosis not present

## 2016-04-16 DIAGNOSIS — S79921A Unspecified injury of right thigh, initial encounter: Secondary | ICD-10-CM | POA: Diagnosis not present

## 2016-04-16 DIAGNOSIS — Z043 Encounter for examination and observation following other accident: Secondary | ICD-10-CM | POA: Diagnosis not present

## 2016-04-16 DIAGNOSIS — S79912A Unspecified injury of left hip, initial encounter: Secondary | ICD-10-CM | POA: Diagnosis not present

## 2016-04-16 DIAGNOSIS — Z7982 Long term (current) use of aspirin: Secondary | ICD-10-CM | POA: Diagnosis not present

## 2016-04-16 DIAGNOSIS — S4992XA Unspecified injury of left shoulder and upper arm, initial encounter: Secondary | ICD-10-CM | POA: Diagnosis not present

## 2016-04-16 DIAGNOSIS — M25461 Effusion, right knee: Secondary | ICD-10-CM | POA: Diagnosis not present

## 2016-04-16 DIAGNOSIS — M25552 Pain in left hip: Secondary | ICD-10-CM | POA: Diagnosis not present

## 2016-04-16 DIAGNOSIS — S3991XA Unspecified injury of abdomen, initial encounter: Secondary | ICD-10-CM | POA: Diagnosis not present

## 2016-04-16 NOTE — ED Notes (Signed)
Bed: WA06 Expected date:  Expected time:  Means of arrival:  Comments: EMS 82 yo female from assisted living/unwitnessed fall/dementia

## 2016-04-17 ENCOUNTER — Emergency Department (HOSPITAL_COMMUNITY): Payer: Commercial Managed Care - HMO

## 2016-04-17 ENCOUNTER — Encounter (HOSPITAL_COMMUNITY): Payer: Self-pay | Admitting: Emergency Medicine

## 2016-04-17 ENCOUNTER — Encounter (HOSPITAL_COMMUNITY): Payer: Self-pay | Admitting: Family Medicine

## 2016-04-17 ENCOUNTER — Emergency Department (HOSPITAL_COMMUNITY)
Admission: EM | Admit: 2016-04-17 | Discharge: 2016-04-17 | Disposition: A | Discharge status: Home / Self Care | Payer: Commercial Managed Care - HMO | Source: Home / Self Care | Attending: Emergency Medicine | Admitting: Emergency Medicine

## 2016-04-17 DIAGNOSIS — M25461 Effusion, right knee: Secondary | ICD-10-CM | POA: Diagnosis not present

## 2016-04-17 DIAGNOSIS — S8992XA Unspecified injury of left lower leg, initial encounter: Secondary | ICD-10-CM | POA: Diagnosis not present

## 2016-04-17 DIAGNOSIS — Y939 Activity, unspecified: Secondary | ICD-10-CM | POA: Insufficient documentation

## 2016-04-17 DIAGNOSIS — Z87891 Personal history of nicotine dependence: Secondary | ICD-10-CM | POA: Insufficient documentation

## 2016-04-17 DIAGNOSIS — M25561 Pain in right knee: Secondary | ICD-10-CM

## 2016-04-17 DIAGNOSIS — Y999 Unspecified external cause status: Secondary | ICD-10-CM

## 2016-04-17 DIAGNOSIS — S8991XA Unspecified injury of right lower leg, initial encounter: Secondary | ICD-10-CM | POA: Diagnosis not present

## 2016-04-17 DIAGNOSIS — M25562 Pain in left knee: Secondary | ICD-10-CM

## 2016-04-17 DIAGNOSIS — R1084 Generalized abdominal pain: Secondary | ICD-10-CM

## 2016-04-17 DIAGNOSIS — G0430 Acute necrotizing hemorrhagic encephalopathy, unspecified: Secondary | ICD-10-CM | POA: Diagnosis not present

## 2016-04-17 DIAGNOSIS — S3991XA Unspecified injury of abdomen, initial encounter: Secondary | ICD-10-CM | POA: Diagnosis not present

## 2016-04-17 DIAGNOSIS — Z043 Encounter for examination and observation following other accident: Secondary | ICD-10-CM | POA: Diagnosis not present

## 2016-04-17 DIAGNOSIS — M25462 Effusion, left knee: Secondary | ICD-10-CM | POA: Diagnosis not present

## 2016-04-17 DIAGNOSIS — N189 Chronic kidney disease, unspecified: Secondary | ICD-10-CM | POA: Insufficient documentation

## 2016-04-17 DIAGNOSIS — S0990XA Unspecified injury of head, initial encounter: Secondary | ICD-10-CM | POA: Diagnosis not present

## 2016-04-17 DIAGNOSIS — M25552 Pain in left hip: Secondary | ICD-10-CM | POA: Insufficient documentation

## 2016-04-17 DIAGNOSIS — E039 Hypothyroidism, unspecified: Secondary | ICD-10-CM | POA: Insufficient documentation

## 2016-04-17 DIAGNOSIS — F0390 Unspecified dementia without behavioral disturbance: Secondary | ICD-10-CM | POA: Insufficient documentation

## 2016-04-17 DIAGNOSIS — Z96641 Presence of right artificial hip joint: Secondary | ICD-10-CM

## 2016-04-17 DIAGNOSIS — I129 Hypertensive chronic kidney disease with stage 1 through stage 4 chronic kidney disease, or unspecified chronic kidney disease: Secondary | ICD-10-CM

## 2016-04-17 DIAGNOSIS — S199XXA Unspecified injury of neck, initial encounter: Secondary | ICD-10-CM | POA: Diagnosis not present

## 2016-04-17 DIAGNOSIS — Z7982 Long term (current) use of aspirin: Secondary | ICD-10-CM | POA: Insufficient documentation

## 2016-04-17 DIAGNOSIS — M79662 Pain in left lower leg: Secondary | ICD-10-CM | POA: Diagnosis not present

## 2016-04-17 DIAGNOSIS — S79912A Unspecified injury of left hip, initial encounter: Secondary | ICD-10-CM | POA: Diagnosis not present

## 2016-04-17 DIAGNOSIS — T148XXA Other injury of unspecified body region, initial encounter: Secondary | ICD-10-CM | POA: Diagnosis not present

## 2016-04-17 DIAGNOSIS — Y92129 Unspecified place in nursing home as the place of occurrence of the external cause: Secondary | ICD-10-CM | POA: Insufficient documentation

## 2016-04-17 DIAGNOSIS — S4992XA Unspecified injury of left shoulder and upper arm, initial encounter: Secondary | ICD-10-CM | POA: Diagnosis not present

## 2016-04-17 DIAGNOSIS — M25551 Pain in right hip: Secondary | ICD-10-CM

## 2016-04-17 DIAGNOSIS — W19XXXA Unspecified fall, initial encounter: Secondary | ICD-10-CM | POA: Insufficient documentation

## 2016-04-17 DIAGNOSIS — S79921A Unspecified injury of right thigh, initial encounter: Secondary | ICD-10-CM | POA: Diagnosis not present

## 2016-04-17 DIAGNOSIS — S79922A Unspecified injury of left thigh, initial encounter: Secondary | ICD-10-CM | POA: Diagnosis not present

## 2016-04-17 LAB — COMPREHENSIVE METABOLIC PANEL
ALK PHOS: 59 U/L (ref 38–126)
ALT: 21 U/L (ref 14–54)
AST: 24 U/L (ref 15–41)
Albumin: 3.7 g/dL (ref 3.5–5.0)
Anion gap: 7 (ref 5–15)
BUN: 16 mg/dL (ref 6–20)
CHLORIDE: 106 mmol/L (ref 101–111)
CO2: 29 mmol/L (ref 22–32)
CREATININE: 0.73 mg/dL (ref 0.44–1.00)
Calcium: 9.2 mg/dL (ref 8.9–10.3)
GFR calc Af Amer: >60 mL/min (ref >60)
Glucose, Bld: 112 mg/dL — ABNORMAL HIGH (ref 65–99)
Potassium: 4.1 mmol/L (ref 3.5–5.1)
Sodium: 142 mmol/L (ref 135–145)
Total Bilirubin: 0.8 mg/dL (ref 0.3–1.2)
Total Protein: 6.8 g/dL (ref 6.5–8.1)

## 2016-04-17 LAB — CBC WITH DIFFERENTIAL/PLATELET
Basophils Absolute: 0 10*3/uL (ref 0.0–0.1)
Basophils Relative: 0 %
EOS ABS: 0.1 10*3/uL (ref 0.0–0.7)
EOS PCT: 1 %
HCT: 35.4 % — ABNORMAL LOW (ref 36.0–46.0)
Hemoglobin: 12.4 g/dL (ref 12.0–15.0)
LYMPHS ABS: 1.5 10*3/uL (ref 0.7–4.0)
LYMPHS PCT: 17 %
MCH: 29.5 pg (ref 26.0–34.0)
MCHC: 35 g/dL (ref 30.0–36.0)
MCV: 84.1 fL (ref 78.0–100.0)
MONOS PCT: 6 %
Monocytes Absolute: 0.5 10*3/uL (ref 0.1–1.0)
Neutro Abs: 6.7 10*3/uL (ref 1.7–7.7)
Neutrophils Relative %: 76 %
PLATELETS: 167 10*3/uL (ref 150–400)
RBC: 4.21 MIL/uL (ref 3.87–5.11)
RDW: 13.3 % (ref 11.5–15.5)
WBC: 8.9 10*3/uL (ref 4.0–10.5)

## 2016-04-17 LAB — URINALYSIS, ROUTINE W REFLEX MICROSCOPIC
BILIRUBIN URINE: NEGATIVE
GLUCOSE, UA: NEGATIVE mg/dL
HGB URINE DIPSTICK: NEGATIVE
Ketones, ur: NEGATIVE mg/dL
Leukocytes, UA: NEGATIVE
Nitrite: NEGATIVE
PH: 6 (ref 5.0–8.0)
Protein, ur: NEGATIVE mg/dL
SPECIFIC GRAVITY, URINE: 1.015 (ref 1.005–1.030)

## 2016-04-17 LAB — CBG MONITORING, ED: GLUCOSE-CAPILLARY: 99 mg/dL (ref 65–99)

## 2016-04-17 LAB — LIPASE, BLOOD: LIPASE: 28 U/L (ref 11–51)

## 2016-04-17 MED ORDER — IOPAMIDOL (ISOVUE-300) INJECTION 61%
100.0000 mL | Freq: Once | INTRAVENOUS | Status: AC | PRN
  Administered 2016-04-17: 100 mL via INTRAVENOUS

## 2016-04-17 MED ORDER — SODIUM CHLORIDE 0.9 % IJ SOLN
INTRAMUSCULAR | Status: AC
  Filled 2016-04-17: qty 50

## 2016-04-17 MED ORDER — ACETAMINOPHEN 325 MG PO TABS
650.0000 mg | ORAL_TABLET | Freq: Once | ORAL | Status: AC
  Administered 2016-04-17: 650 mg via ORAL
  Filled 2016-04-17: qty 2

## 2016-04-17 MED ORDER — IOPAMIDOL (ISOVUE-300) INJECTION 61%
INTRAVENOUS | Status: AC
  Filled 2016-04-17: qty 100

## 2016-04-17 NOTE — ED Notes (Signed)
PT aware urine sample need to still be collected, PT info me she may not know when she have to go to restroom

## 2016-04-17 NOTE — ED Notes (Signed)
Patient transported to CT 

## 2016-04-17 NOTE — ED Triage Notes (Signed)
Patient had an unwitnessed fall. Pt has history of dementia but facility reports this is her baseline. Pt is complaining of left shoulder and left hip pain. EMS reports they found patient on her left side.

## 2016-04-17 NOTE — ED Provider Notes (Signed)
Patient presented to the ER with fall. Patient with unwitnessed fall and dementia, cannot give much more information. Initially complained of left shoulder and left hip pain.  Face to face Exam: HEENT - PERRLA Lungs - CTAB Heart - RRR, no M/R/G Abd - S/NT/ND Neuro - alert, confused  Plan: Appropriate imaging has been performed and is negative.   Gilda Creasehristopher J Pollina, MD 04/17/16 [id number]

## 2016-04-17 NOTE — ED Triage Notes (Addendum)
Per EMS, patient had unwitnessed fall this morning. Patient is complaining of left hip/left leg pain. Patient had another fall even earlier this morning. Patient had just got home from the hospital this morning when she had this fall. Hx of dementia. Facility staff members state patient seems more confused over the last 24 hours. For example, getting out of bed by herself. Patient normally askes for help. Facility wants patient checked for  UTI. Patient not complaining of any urinary symptoms. [facility]

## 2016-04-17 NOTE — ED Notes (Signed)
Pt unable to sign for discharge due to altered mental status. [facility]

## 2016-04-17 NOTE — ED Notes (Signed)
Unsuccessful IV attempt x2. Katie RN asked to attempt. 

## 2016-04-17 NOTE — ED Provider Notes (Signed)
[facility]-EMERGENCY DEPT Provider Note   CSN: [id number] Arrival date & time: 04/16/16  2357 By signing my name below, I, [name], attest that this documentation has been prepared under the direction and in the presence of non-physician practitioner, Antony Madura, PA-C Electronically Signed: Levon Horton, Scribe. 04/17/2016. 12:14 AM.    History   Chief Complaint Chief Complaint  Patient presents with  . Fall   Level V Caveat due to dementia   HPI [name] is a 82 y.o. female who presents to the Emergency Department s/p unwitnessed fall tonight complaining of left sided hip and shoulder pain. She reports feeling generally sore and states her "head feels heavy". Per EMS, pt was found on her left side. She denies any CP or SOB.   The history is provided by the patient and medical records. The history is limited by the absence of a caregiver. No language interpreter was used.    Past Medical History:  Diagnosis Date  . Acute bronchitis 04/10/2013  . Anemia   . Angina   . Anxiety   . Arthritis   . Arthritis 10/02/2012  . Asthma   . Chicken pox   . Chronic kidney disease   . Colon polyps   . Dehydration 08/31/2013  . Dementia   . Depression   . Dysrhythmia   . Headache(784.0)   . Heart murmur   . Hypertension   . Hypothyroidism   . Left leg pain 10/02/2012  . Pneumonia   . Recurrent upper respiratory infection (URI)   . Shortness of breath   . UTI (urinary tract infection) 12/30/2012  . Valvular heart disease 09/04/2012    Patient Active Problem List   Diagnosis Date Noted  . Pressure ulcer 09/20/2015  . Displaced fracture of right femoral neck (HCC) 09/19/2015  . Altered mental status   . Confusion 06/26/2015  . Fall   . Aspiration pneumonia (HCC) 01/10/2015  . Acute encephalopathy 01/08/2015  . UTI (urinary tract infection) 01/08/2015  . 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 Recurrent falls 11/27/2014  . FTT (failure to thrive) in adult  10/26/2014  . Dehydration 10/26/2014  . Abnormal urinalysis 10/26/2014  . Normocytic anemia 10/26/2014  . Lump of skin of left upper extremity 09/07/2014  . Rash and nonspecific skin eruption 05/08/2014  . Syncope and collapse 11/30/2013  . Low back pain, episodic 11/20/2013  . Chronic kidney disease   . Insomnia 08/31/2013  . Arthritis 10/02/2012  . Valvular heart disease 09/04/2012  . Restless leg syndrome 06/06/2012  . Trochanteric bursitis 08/02/2011  . Dementia 08/02/2011  . Leg pain 03/28/2011  . Hypertension 09/22/2010  . Hypothyroidism 09/22/2010    Past Surgical History:  Procedure Laterality Date  . ABDOMINAL HYSTERECTOMY    . APPENDECTOMY    . BACK SURGERY    . CHOLECYSTECTOMY    . COLONOSCOPY W/ POLYPECTOMY    . EYE SURGERY     cataract removed and eye lids lifted  . feet surgery    . FRACTURE SURGERY     bilateral arms  . HAND SURGERY    . HIP ARTHROPLASTY Right 09/20/2015   Procedure: ARTHROPLASTY RIGHT  BIPOLAR ANTERIOR HIP (HEMIARTHROPLASTY);  Surgeon: Samson Frederic, MD;  Location: [facility];  Service: Orthopedics;  Laterality: Right;  . kidney stones    . SHOULDER ARTHROSCOPY    . TONSILLECTOMY    . TONSILLECTOMY    . TUBAL LIGATION      OB History  No data available       Home Medications    Prior to Admission medications   Medication Sig Start Date End Date Taking? Authorizing Provider  acetaminophen (TYLENOL) 500 MG tablet Take 500 mg by mouth every 4 (four) hours as needed for mild pain, moderate pain or fever.    Historical Provider, MD  alum & mag hydroxide-simeth (MINTOX) 200-200-20 MG/5ML suspension Take 30 mLs by mouth 4 (four) times daily as needed for indigestion or heartburn.     Historical Provider, MD  aspirin 81 MG chewable tablet Chew 81 mg by mouth daily.    Historical Provider, MD  aspirin EC 325 MG EC tablet Take 1 tablet (325 mg total) by mouth 2 (two) times daily after a meal. To be taken for 30 days postop (surgery date:  09/20/15). After the 30 days period, change to prior dose of 81 mg daily. Patient not taking: Reported on 11/11/2015 09/23/15   Elease Etienne, MD  cephALEXin (KEFLEX) 500 MG capsule Take 1 capsule (500 mg total) by mouth 3 (three) times daily. 01/09/16   Donnetta Hutching, MD  clonazePAM (KLONOPIN) 0.5 MG tablet Take 1 tablet (0.5 mg total) by mouth 2 (two) times daily. Patient not taking: Reported on 01/09/2016 09/23/15   Elease Etienne, MD  divalproex (DEPAKOTE SPRINKLE) 125 MG capsule Take 125 mg by mouth every morning.    Historical Provider, MD  escitalopram (LEXAPRO) 10 MG tablet Take 10 mg by mouth daily.    Historical Provider, MD  guaifenesin (ROBITUSSIN) 100 MG/5ML syrup Take 200 mg by mouth 2 (two) times daily as needed for cough.    Historical Provider, MD  levothyroxine (SYNTHROID, LEVOTHROID) 50 MCG tablet TAKE 1 TABLET (50 MCG TOTAL) BY MOUTH DAILY. 09/25/14   Myrlene Broker, MD  loperamide (IMODIUM) 2 MG capsule Take 2 mg by mouth as needed for diarrhea or loose stools (do not exceed 8 doses in 24 hours.).     Historical Provider, MD  magnesium hydroxide (MILK OF MAGNESIA) 400 MG/5ML suspension Take 30 mLs by mouth at bedtime as needed for mild constipation.    Historical Provider, MD  Multiple Vitamins-Minerals (MULTIVITAMIN WITH MINERALS) tablet Take 1 tablet by mouth daily.    Historical Provider, MD  neomycin-bacitracin-polymyxin (NEOSPORIN) ointment Apply 1 application topically daily as needed for wound care.     Historical Provider, MD  PRESCRIPTION MEDICATION Take 1 each by mouth 3 (three) times daily. Mighty Shakes    Historical Provider, MD  rivastigmine (EXELON) 3 MG capsule Take 3 mg by mouth 2 (two) times daily.    Historical Provider, MD  senna-docusate (SENEXON-S) 8.6-50 MG tablet Take 2 tablets by mouth 2 (two) times daily.     Historical Provider, MD  traMADol (ULTRAM) 50 MG tablet Take 1 tablet (50 mg total) by mouth every 6 (six) hours as needed for severe pain. 09/23/15    Elease Etienne, MD  traZODone (DESYREL) 50 MG tablet Take 50 mg by mouth at bedtime.     Historical Provider, MD    Family History Family History  Problem Relation Age of Onset  . Heart disease Mother   . Heart disease Father   . Heart disease Other   . Birth defects Other     Social History Social History  Substance Use Topics  . Smoking status: Former Smoker    Packs/day: 0.20    Years: 1.00    Types: Cigarettes    Quit date: 06/02/1941  . Smokeless  tobacco: Never Used  . Alcohol use No     Allergies   Morphine and related; Codeine; and Hydrocodone   Review of Systems Review of Systems  Unable to perform ROS: Dementia    Physical Exam Updated Vital Signs BP 156/67 (BP Location: Right Arm)   Pulse (!) 57   Temp 97.6 F (36.4 C) (Oral)   Resp 19   SpO2 98%   Physical Exam  Constitutional: She appears well-developed and well-nourished. No distress.  HENT:  Head: Normocephalic and atraumatic.  Mouth/Throat: Oropharynx is clear and moist.  No hematoma, contusion, battle's sign, or raccoon's eyes.  Eyes: Conjunctivae and EOM are normal. Pupils are equal, round, and reactive to light. No scleral icterus.  Neck:  Neck immobilized with towel  Cardiovascular: Normal rate, regular rhythm and intact distal pulses.   Distal radial pulse 2+ in the LUE. Capillary refill brisk in all digits of the L hand.  Pulmonary/Chest: Effort normal. No respiratory distress.  Respirations even and unlabored  Musculoskeletal: Normal range of motion. She exhibits tenderness.  Mild TTP to the left shoulder. ROM preserved. No crepitus or deformity noted to LUE. TTP to the left hip without crepitus or deformity. No leg shortening or malrotation.   Neurological: She is alert. She exhibits normal muscle tone. Coordination normal.  Patient alert. She answers questions appropriately and follows commands.  Skin: Skin is warm and dry. No rash noted. She is not diaphoretic. No erythema. No  pallor.  Psychiatric: She has a normal mood and affect. Her behavior is normal.  Nursing note and vitals reviewed.   ED Treatments / Results  DIAGNOSTIC STUDIES:  Oxygen Saturation is 96% on RA, adequate by my interpretation.    COORDINATION OF CARE:  12:13 AM Will order CT head, DG hip and DG shoulder left. Discussed treatment plan with pt at bedside and pt agreed to plan.  Labs (all labs ordered are listed, but only abnormal results are displayed) Labs Reviewed  URINALYSIS, ROUTINE W REFLEX MICROSCOPIC (NOT AT [facility])    EKG  EKG Interpretation None       Radiology Ct Head Wo Contrast  Result Date: 04/17/2016 CLINICAL DATA:  Status post unwitnessed fall, with concern for head or cervical spine injury. Initial encounter. EXAM: CT HEAD WITHOUT CONTRAST CT CERVICAL SPINE WITHOUT CONTRAST TECHNIQUE: Multidetector CT imaging of the head and cervical spine was performed following the standard protocol without intravenous contrast. Multiplanar CT image reconstructions of the cervical spine were also generated. COMPARISON:  CT of the head and cervical spine performed 03/27/2016 FINDINGS: CT HEAD FINDINGS Brain: No evidence of acute infarction, hemorrhage, hydrocephalus, extra-axial collection or mass lesion/mass effect. Prominence of the ventricles and sulci reflects mild to moderate cortical volume loss. Cerebellar atrophy is noted. Scattered periventricular and subcortical white matter change likely reflects small vessel ischemic microangiopathy. The brainstem and fourth ventricle are within normal limits. The basal ganglia are unremarkable in appearance. The cerebral hemispheres demonstrate grossly normal gray-white differentiation. No mass effect or midline shift is seen. Vascular: No hyperdense vessel or unexpected calcification. Skull: There is no evidence of fracture; visualized osseous structures are unremarkable in appearance. Sinuses/Orbits: The orbits are within normal limits. The  paranasal sinuses and mastoid air cells are well-aerated. Other: No significant soft tissue abnormalities are seen. CT CERVICAL SPINE FINDINGS Alignment: Normal. Skull base and vertebrae: No acute fracture. No primary bone lesion or focal pathologic process. Soft tissues and spinal canal: No prevertebral fluid or swelling. No visible canal hematoma. Disc  levels: Intervertebral disc spaces are preserved. Small posterior disc osteophyte complexes are noted along the cervical spine. Minimal calcification is seen along multiple mid cervical discs. Mild underlying facet disease is noted. Upper chest: Mild calcification is noted at the carotid bifurcations bilaterally. The thyroid gland is unremarkable. The visualized lung apices are clear. Other: No additional soft tissue abnormalities are seen. IMPRESSION: 1. No evidence of traumatic intracranial injury or fracture. 2. No evidence fracture or subluxation along the cervical spine. 3. Mild to moderate cortical volume loss and scattered small vessel ischemic microangiopathy. 4. Minimal degenerative change along the cervical spine. 5. Mild calcification at the carotid bifurcations bilaterally. Carotid ultrasound could be considered for further evaluation, when and as deemed clinically appropriate. Electronically Signed   By: Roanna RaiderJeffery  Chang M.D.   On: 04/17/2016 01:02   Ct Cervical Spine Wo Contrast  Result Date: 04/17/2016 CLINICAL DATA:  Status post unwitnessed fall, with concern for head or cervical spine injury. Initial encounter. EXAM: CT HEAD WITHOUT CONTRAST CT CERVICAL SPINE WITHOUT CONTRAST TECHNIQUE: Multidetector CT imaging of the head and cervical spine was performed following the standard protocol without intravenous contrast. Multiplanar CT image reconstructions of the cervical spine were also generated. COMPARISON:  CT of the head and cervical spine performed 03/27/2016 FINDINGS: CT HEAD FINDINGS Brain: No evidence of acute infarction, hemorrhage,  hydrocephalus, extra-axial collection or mass lesion/mass effect. Prominence of the ventricles and sulci reflects mild to moderate cortical volume loss. Cerebellar atrophy is noted. Scattered periventricular and subcortical white matter change likely reflects small vessel ischemic microangiopathy. The brainstem and fourth ventricle are within normal limits. The basal ganglia are unremarkable in appearance. The cerebral hemispheres demonstrate grossly normal gray-white differentiation. No mass effect or midline shift is seen. Vascular: No hyperdense vessel or unexpected calcification. Skull: There is no evidence of fracture; visualized osseous structures are unremarkable in appearance. Sinuses/Orbits: The orbits are within normal limits. The paranasal sinuses and mastoid air cells are well-aerated. Other: No significant soft tissue abnormalities are seen. CT CERVICAL SPINE FINDINGS Alignment: Normal. Skull base and vertebrae: No acute fracture. No primary bone lesion or focal pathologic process. Soft tissues and spinal canal: No prevertebral fluid or swelling. No visible canal hematoma. Disc levels: Intervertebral disc spaces are preserved. Small posterior disc osteophyte complexes are noted along the cervical spine. Minimal calcification is seen along multiple mid cervical discs. Mild underlying facet disease is noted. Upper chest: Mild calcification is noted at the carotid bifurcations bilaterally. The thyroid gland is unremarkable. The visualized lung apices are clear. Other: No additional soft tissue abnormalities are seen. IMPRESSION: 1. No evidence of traumatic intracranial injury or fracture. 2. No evidence fracture or subluxation along the cervical spine. 3. Mild to moderate cortical volume loss and scattered small vessel ischemic microangiopathy. 4. Minimal degenerative change along the cervical spine. 5. Mild calcification at the carotid bifurcations bilaterally. Carotid ultrasound could be considered for  further evaluation, when and as deemed clinically appropriate. Electronically Signed   By: Roanna RaiderJeffery  Chang M.D.   On: 04/17/2016 01:02   Dg Shoulder Left  Result Date: 04/17/2016 CLINICAL DATA:  Larey SeatFell at home this morning. EXAM: LEFT SHOULDER - 2+ VIEW COMPARISON:  11/27/2014 FINDINGS: There is no evidence of fracture or dislocation. Mild left shoulder arthritis. No bone lesion or bony destruction. IMPRESSION: Negative. Electronically Signed   By: Ellery Plunkaniel R Mitchell M.D.   On: 04/17/2016 01:01   Dg Hip Unilat With Pelvis 2-3 Views Left  Result Date: 04/17/2016 CLINICAL DATA:  Larey SeatFell at  home this morning. EXAM: DG HIP (WITH OR WITHOUT PELVIS) 2-3V LEFT COMPARISON:  03/23/2015 FINDINGS: There is no evidence of hip fracture or dislocation. There is no evidence of arthropathy or other focal bone abnormality. IMPRESSION: Negative. Electronically Signed   By: Ellery Plunkaniel R Mitchell M.D.   On: 04/17/2016 01:01    Procedures Procedures (including critical care time)  Medications Ordered in ED Medications - No data to display   Initial Impression / Assessment and Plan / ED Course  I have reviewed the triage vital signs and the nursing notes.  Pertinent labs & imaging results that were available during my care of the patient were reviewed by me and considered in my medical decision making (see chart for details).  Clinical Course     82 year old female presents to the emergency department for evaluation following a fall. Hx of dementia. Imaging is reassuring. Urinalysis negative for UTI. Patient hemodynamically stable. Pain likely secondary to hip and shoulder contusion. Plan for discharge with outpatient follow-up as needed. Return precautions provided at discharge. Patient discharged in satisfactory condition.   Final Clinical Impressions(s) / ED Diagnoses   Final diagnoses:  Fall, initial encounter  Contusion of left hip, initial encounter  Acute pain of left shoulder    New Prescriptions New  Prescriptions   No medications on file    I personally performed the services described in this documentation, which was scribed in my presence. The recorded information has been reviewed and is accurate.      Antony MaduraKelly Envi Eagleson, PA-C 04/17/16 [phone]    Gilda Creasehristopher J Pollina, MD 04/17/16 [phone]

## 2016-04-17 NOTE — ED Notes (Signed)
Took Pt to bathroom. Pt missed hat to collect. Unable to collect UA at this time. Will recheck.

## 2016-04-17 NOTE — ED Provider Notes (Signed)
[facility]-EMERGENCY DEPT Provider Note   CSN: [id number] Arrival date & time: 04/17/16  1031  LEVEL 5 CAVEAT - DEMENTIA   History   Chief Complaint Chief Complaint  Patient presents with  . Fall    HPI [name] is a 82 y.o. female.  HPI  82 year old female presents from the nursing home by EMS for a second fall in about 12 hours. Was seen here last night for a fall, found laying on her left side. Today at around 9 AM found near her closet laying on her left side. Nurse states patient does not walk more than just a few steps without significant assistance. She normally knows not to walk without assistance but has been trying to walk more on her own over the last 24 hours so the nurse wanted her urine checked for UTI. Otherwise has not been ill. Patient tells me she has a lot of leg pain on both sides including her left lateral hip.  Past Medical History:  Diagnosis Date  . Acute bronchitis 04/10/2013  . Anemia   . Angina   . Anxiety   . Arthritis   . Arthritis 10/02/2012  . Asthma   . Chicken pox   . Chronic kidney disease   . Colon polyps   . Dehydration 08/31/2013  . Dementia   . Depression   . Dysrhythmia   . Headache(784.0)   . Heart murmur   . Hypertension   . Hypothyroidism   . Left leg pain 10/02/2012  . Pneumonia   . Recurrent upper respiratory infection (URI)   . Shortness of breath   . UTI (urinary tract infection) 12/30/2012  . Valvular heart disease 09/04/2012    Patient Active Problem List   Diagnosis Date Noted  . Pressure ulcer 09/20/2015  . Displaced fracture of right femoral neck (HCC) 09/19/2015  . Altered mental status   . Confusion 06/26/2015  . Fall   . Aspiration pneumonia (HCC) 01/10/2015  . Acute encephalopathy 01/08/2015  . UTI (urinary tract infection) 01/08/2015  . 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 Recurrent falls 11/27/2014  . FTT (failure to thrive) in adult 10/26/2014  . Dehydration 10/26/2014  . Abnormal  urinalysis 10/26/2014  . Normocytic anemia 10/26/2014  . Lump of skin of left upper extremity 09/07/2014  . Rash and nonspecific skin eruption 05/08/2014  . Syncope and collapse 11/30/2013  . Low back pain, episodic 11/20/2013  . Chronic kidney disease   . Insomnia 08/31/2013  . Arthritis 10/02/2012  . Valvular heart disease 09/04/2012  . Restless leg syndrome 06/06/2012  . Trochanteric bursitis 08/02/2011  . Dementia 08/02/2011  . Leg pain 03/28/2011  . Hypertension 09/22/2010  . Hypothyroidism 09/22/2010    Past Surgical History:  Procedure Laterality Date  . ABDOMINAL HYSTERECTOMY    . APPENDECTOMY    . BACK SURGERY    . CHOLECYSTECTOMY    . COLONOSCOPY W/ POLYPECTOMY    . EYE SURGERY     cataract removed and eye lids lifted  . feet surgery    . FRACTURE SURGERY     bilateral arms  . HAND SURGERY    . HIP ARTHROPLASTY Right 09/20/2015   Procedure: ARTHROPLASTY RIGHT  BIPOLAR ANTERIOR HIP (HEMIARTHROPLASTY);  Surgeon: Samson Frederic, MD;  Location: [facility];  Service: Orthopedics;  Laterality: Right;  . kidney stones    . SHOULDER ARTHROSCOPY    . TONSILLECTOMY    . TONSILLECTOMY    . TUBAL LIGATION  OB History    No data available       Home Medications    Prior to Admission medications   Medication Sig Start Date End Date Taking? Authorizing Provider  acetaminophen (TYLENOL) 500 MG tablet Take 500 mg by mouth every 4 (four) hours as needed for mild pain, moderate pain, fever or headache.    Yes Historical Provider, MD  alum & mag hydroxide-simeth (MINTOX) 200-200-20 MG/5ML suspension Take 30 mLs by mouth every 6 (six) hours as needed for indigestion or heartburn.    Yes Historical Provider, MD  aspirin 81 MG chewable tablet Chew 81 mg by mouth daily.   Yes Historical Provider, MD  clonazePAM (KLONOPIN) 0.5 MG tablet Take 1 tablet (0.5 mg total) by mouth 2 (two) times daily. 09/23/15  Yes Elease Etienne, MD  divalproex (DEPAKOTE SPRINKLE) 125 MG capsule Take  125 mg by mouth 3 (three) times daily.    Yes Historical Provider, MD  escitalopram (LEXAPRO) 10 MG tablet Take 10 mg by mouth daily.   Yes Historical Provider, MD  guaifenesin (ROBITUSSIN) 100 MG/5ML syrup Take 200 mg by mouth every 6 (six) hours as needed for cough.    Yes Historical Provider, MD  levothyroxine (SYNTHROID, LEVOTHROID) 50 MCG tablet Take 50 mcg by mouth daily before breakfast.   Yes Historical Provider, MD  loperamide (IMODIUM) 2 MG capsule Take 2 mg by mouth as needed for diarrhea or loose stools.    Yes Historical Provider, MD  magnesium hydroxide (MILK OF MAGNESIA) 400 MG/5ML suspension Take 30 mLs by mouth at bedtime as needed for mild constipation.   Yes Historical Provider, MD  Multiple Vitamins-Minerals (MULTIVITAMIN WITH MINERALS) tablet Take 1 tablet by mouth daily.   Yes Historical Provider, MD  neomycin-bacitracin-polymyxin (NEOSPORIN) ointment Apply 1 application topically as needed for wound care.    Yes Historical Provider, MD  Nutritional Supplements (NUTRITIONAL DRINK) LIQD Take 1 Bottle by mouth 3 (three) times daily with meals. Mighty Shakes   Yes Historical Provider, MD  rivastigmine (EXELON) 3 MG capsule Take 3 mg by mouth 2 (two) times daily.   Yes Historical Provider, MD  senna (SENOKOT) 8.6 MG TABS tablet Take 2 tablets by mouth 2 (two) times daily.   Yes Historical Provider, MD  Skin Protectants, Misc. (BAZA PROTECT EX) Apply 1 application topically as needed (after each diaper change).   Yes Historical Provider, MD  traMADol (ULTRAM) 50 MG tablet Take 1 tablet (50 mg total) by mouth every 6 (six) hours as needed for severe pain. 09/23/15  Yes Elease Etienne, MD  traZODone (DESYREL) 50 MG tablet Take 50 mg by mouth at bedtime.    Yes Historical Provider, MD    Family History Family History  Problem Relation Age of Onset  . Heart disease Mother   . Heart disease Father   . Heart disease Other   . Birth defects Other     Social History Social History   Substance Use Topics  . Smoking status: Former Smoker    Packs/day: 0.20    Years: 1.00    Types: Cigarettes    Quit date: 06/02/1941  . Smokeless tobacco: Never Used  . Alcohol use No     Allergies   Morphine and related; Codeine; and Hydrocodone   Review of Systems Review of Systems  Unable to perform ROS: Dementia     Physical Exam Updated Vital Signs BP 163/71 (BP Location: Left Arm)   Pulse 64   Temp 97.8 F (36.6  C) (Oral)   Resp 18   Ht 5\' 5"  (1.651 m)   Wt 177 lb (80.3 kg)   SpO2 94%   BMI 29.45 kg/m   Physical Exam  Constitutional: She appears well-developed and well-nourished.  HENT:  Head: Normocephalic and atraumatic.  Right Ear: External ear normal.  Left Ear: External ear normal.  Nose: Nose normal.  Eyes: EOM are normal. Pupils are equal, round, and reactive to light. Right eye exhibits no discharge. Left eye exhibits no discharge.  Neck: Neck supple. No spinous process tenderness and no muscular tenderness present.  Cardiovascular: Normal rate, regular rhythm, normal heart sounds and intact distal pulses.   Pulmonary/Chest: Effort normal and breath sounds normal.  Abdominal: Soft. There is tenderness (diffuse).  Musculoskeletal:       Right hip: She exhibits tenderness. She exhibits normal range of motion. Decreased strength: lateral.       Left hip: She exhibits tenderness (lateral). She exhibits normal range of motion.       Right knee: She exhibits normal range of motion. Tenderness found.       Left knee: She exhibits normal range of motion. Tenderness (posterior) found.       Right lower leg: She exhibits tenderness.       Left lower leg: She exhibits tenderness.  Neurological: She is alert. She is disoriented.  Skin: Skin is warm and dry.  Nursing note and vitals reviewed.    ED Treatments / Results  Labs (all labs ordered are listed, but only abnormal results are displayed) Labs Reviewed  CBC WITH DIFFERENTIAL/PLATELET - Abnormal;  Notable for the following:       Result Value   HCT 35.4 (*)    All other components within normal limits  COMPREHENSIVE METABOLIC PANEL - Abnormal; Notable for the following:    Glucose, Bld 112 (*)    All other components within normal limits  LIPASE, BLOOD  URINALYSIS, ROUTINE W REFLEX MICROSCOPIC (NOT AT [facility])  CBG MONITORING, ED    EKG  EKG Interpretation  Date/Time:  Thursday April 17 2016 12:19:41 EST Ventricular Rate:  64 PR Interval:    QRS Duration: 155 QT Interval:  484 QTC Calculation: 500 R Axis:   -67 Text Interpretation:  Sinus rhythm Ventricular premature complex Prolonged PR interval RBBB and LAFB Baseline wander in lead(s) V2 no significant change since aug 2017 Confirmed by Amiee Wiley MD, Aleksis Jiggetts [phone]) on 04/17/2016 2:53:35 PM       Radiology Dg Tibia/fibula Left  Result Date: 04/17/2016 CLINICAL DATA:  Larey Seat last night. Dementia up. Lower extremity pain. EXAM: LEFT TIBIA AND FIBULA - 2 VIEW COMPARISON:  None. FINDINGS: No fracture of the tibia or fibula. IMPRESSION: Negative. Electronically Signed   By: Paulina Fusi M.D.   On: 04/17/2016 13:36   Dg Tibia/fibula Right  Result Date: 04/17/2016 CLINICAL DATA:  Dementia.  Unwitnessed fall last night. EXAM: RIGHT TIBIA AND FIBULA - 2 VIEW COMPARISON:  None. FINDINGS: There is no evidence of fracture or other focal bone lesions. Soft tissues are unremarkable. IMPRESSION: Negative. Electronically Signed   By: Paulina Fusi M.D.   On: 04/17/2016 13:37   Ct Head Wo Contrast  Result Date: 04/17/2016 CLINICAL DATA:  Larey Seat.  Altered mental status. EXAM: CT HEAD WITHOUT CONTRAST TECHNIQUE: Contiguous axial images were obtained from the base of the skull through the vertex without intravenous contrast. COMPARISON:  04/17/2016. FINDINGS: Brain: Diffusely enlarged ventricles and subarachnoid spaces. Patchy white matter low density in both cerebral hemispheres.  No intracranial hemorrhage, mass lesion or CT evidence of acute  infarction. Vascular: No hyperdense vessel or unexpected calcification. Skull: Normal. Negative for fracture or focal lesion. Sinuses/Orbits: No acute finding. Other: None. IMPRESSION: 1. No acute abnormality. 2. Stable atrophy chronic small vessel white matter ischemic changes. Electronically Signed   By: Beckie SaltsSteven  Reid M.D.   On: 04/17/2016 16:20   Ct Head Wo Contrast  Result Date: 04/17/2016 CLINICAL DATA:  Status post unwitnessed fall, with concern for head or cervical spine injury. Initial encounter. EXAM: CT HEAD WITHOUT CONTRAST CT CERVICAL SPINE WITHOUT CONTRAST TECHNIQUE: Multidetector CT imaging of the head and cervical spine was performed following the standard protocol without intravenous contrast. Multiplanar CT image reconstructions of the cervical spine were also generated. COMPARISON:  CT of the head and cervical spine performed 03/27/2016 FINDINGS: CT HEAD FINDINGS Brain: No evidence of acute infarction, hemorrhage, hydrocephalus, extra-axial collection or mass lesion/mass effect. Prominence of the ventricles and sulci reflects mild to moderate cortical volume loss. Cerebellar atrophy is noted. Scattered periventricular and subcortical white matter change likely reflects small vessel ischemic microangiopathy. The brainstem and fourth ventricle are within normal limits. The basal ganglia are unremarkable in appearance. The cerebral hemispheres demonstrate grossly normal gray-white differentiation. No mass effect or midline shift is seen. Vascular: No hyperdense vessel or unexpected calcification. Skull: There is no evidence of fracture; visualized osseous structures are unremarkable in appearance. Sinuses/Orbits: The orbits are within normal limits. The paranasal sinuses and mastoid air cells are well-aerated. Other: No significant soft tissue abnormalities are seen. CT CERVICAL SPINE FINDINGS Alignment: Normal. Skull base and vertebrae: No acute fracture. No primary bone lesion or focal  pathologic process. Soft tissues and spinal canal: No prevertebral fluid or swelling. No visible canal hematoma. Disc levels: Intervertebral disc spaces are preserved. Small posterior disc osteophyte complexes are noted along the cervical spine. Minimal calcification is seen along multiple mid cervical discs. Mild underlying facet disease is noted. Upper chest: Mild calcification is noted at the carotid bifurcations bilaterally. The thyroid gland is unremarkable. The visualized lung apices are clear. Other: No additional soft tissue abnormalities are seen. IMPRESSION: 1. No evidence of traumatic intracranial injury or fracture. 2. No evidence fracture or subluxation along the cervical spine. 3. Mild to moderate cortical volume loss and scattered small vessel ischemic microangiopathy. 4. Minimal degenerative change along the cervical spine. 5. Mild calcification at the carotid bifurcations bilaterally. Carotid ultrasound could be considered for further evaluation, when and as deemed clinically appropriate. Electronically Signed   By: Roanna RaiderJeffery  Chang M.D.   On: 04/17/2016 01:02   Ct Cervical Spine Wo Contrast  Result Date: 04/17/2016 CLINICAL DATA:  Status post unwitnessed fall, with concern for head or cervical spine injury. Initial encounter. EXAM: CT HEAD WITHOUT CONTRAST CT CERVICAL SPINE WITHOUT CONTRAST TECHNIQUE: Multidetector CT imaging of the head and cervical spine was performed following the standard protocol without intravenous contrast. Multiplanar CT image reconstructions of the cervical spine were also generated. COMPARISON:  CT of the head and cervical spine performed 03/27/2016 FINDINGS: CT HEAD FINDINGS Brain: No evidence of acute infarction, hemorrhage, hydrocephalus, extra-axial collection or mass lesion/mass effect. Prominence of the ventricles and sulci reflects mild to moderate cortical volume loss. Cerebellar atrophy is noted. Scattered periventricular and subcortical white matter change  likely reflects small vessel ischemic microangiopathy. The brainstem and fourth ventricle are within normal limits. The basal ganglia are unremarkable in appearance. The cerebral hemispheres demonstrate grossly normal gray-white differentiation. No mass effect or midline shift is  seen. Vascular: No hyperdense vessel or unexpected calcification. Skull: There is no evidence of fracture; visualized osseous structures are unremarkable in appearance. Sinuses/Orbits: The orbits are within normal limits. The paranasal sinuses and mastoid air cells are well-aerated. Other: No significant soft tissue abnormalities are seen. CT CERVICAL SPINE FINDINGS Alignment: Normal. Skull base and vertebrae: No acute fracture. No primary bone lesion or focal pathologic process. Soft tissues and spinal canal: No prevertebral fluid or swelling. No visible canal hematoma. Disc levels: Intervertebral disc spaces are preserved. Small posterior disc osteophyte complexes are noted along the cervical spine. Minimal calcification is seen along multiple mid cervical discs. Mild underlying facet disease is noted. Upper chest: Mild calcification is noted at the carotid bifurcations bilaterally. The thyroid gland is unremarkable. The visualized lung apices are clear. Other: No additional soft tissue abnormalities are seen. IMPRESSION: 1. No evidence of traumatic intracranial injury or fracture. 2. No evidence fracture or subluxation along the cervical spine. 3. Mild to moderate cortical volume loss and scattered small vessel ischemic microangiopathy. 4. Minimal degenerative change along the cervical spine. 5. Mild calcification at the carotid bifurcations bilaterally. Carotid ultrasound could be considered for further evaluation, when and as deemed clinically appropriate. Electronically Signed   By: Roanna Raider M.D.   On: 04/17/2016 01:02   Ct Abdomen Pelvis W Contrast  Result Date: 04/17/2016 CLINICAL DATA:  82 y/o F; unwitnessed fall  complaining of left hip and leg pain. Increasing confusion. EXAM: CT ABDOMEN AND PELVIS WITH CONTRAST TECHNIQUE: Multidetector CT imaging of the abdomen and pelvis was performed using the standard protocol following bolus administration of intravenous contrast. CONTRAST:  ISOVUE-300 IOPAMIDOL (ISOVUE-300) INJECTION 61% COMPARISON:  03/29/2013 CT of abdomen and pelvis FINDINGS: Lower chest: No acute abnormality. Hepatobiliary: No focal liver abnormality is seen. Status post cholecystectomy. No biliary dilatation. Pancreas: Unremarkable. No pancreatic ductal dilatation or surrounding inflammatory changes. Spleen: Normal in size without focal abnormality. 11 mm splenule inferior to lower pole of spleen. Adrenals/Urinary Tract: Left kidney lower pole caliceal stone measuring up to 20 mm. No focal kidney lesion. Stable subcentimeter renal cysts bilaterally. Mild right-sided pelviectasis is unchanged. No hydronephrosis. Normal adrenal glands. Normal bladder. Stomach/Bowel: Stomach is within normal limits. Appendix not identified. No evidence of bowel wall thickening, distention, or inflammatory changes. Scattered diverticulosis without evidence of diverticulitis. Vascular/Lymphatic: Aortic atherosclerosis. No enlarged abdominal or pelvic lymph nodes. Reproductive: Status post hysterectomy. No adnexal masses. Other: Surgical staples within the upper anterior abdominal wall, moderate right paramedian diastases rectus with broad base eventration mildly increased in size from prior CT. Musculoskeletal: Right hip hemi arthroplasty appears well seated without apparent hardware related complication. Mild osteoarthrosis of the left hip joint with joint space narrowing and periarticular osteophytes. No acute fracture is identified. Multilevel lumbar spondylosis with discogenic changes, grade 1 L4-5 anterolisthesis, and prominent facet arthropathy. IMPRESSION: 1. No acute process identified. 2. Left kidney lower pole stable  nonobstructing stone. No focal kidney lesion or obstructive uropathy. Normal bladder. 3. Aortic atherosclerosis. Electronically Signed   By: Mitzi Hansen M.D.   On: 04/17/2016 16:32   Dg Shoulder Left  Result Date: 04/17/2016 CLINICAL DATA:  Larey Seat at home this morning. EXAM: LEFT SHOULDER - 2+ VIEW COMPARISON:  11/27/2014 FINDINGS: There is no evidence of fracture or dislocation. Mild left shoulder arthritis. No bone lesion or bony destruction. IMPRESSION: Negative. Electronically Signed   By: Ellery Plunk M.D.   On: 04/17/2016 01:01   Dg Knee Complete 4 Views Left  Result Date: 04/17/2016  CLINICAL DATA:  Unwitnessed fall last night.  Dementia. EXAM: LEFT KNEE - COMPLETE 4+ VIEW COMPARISON:  None. FINDINGS: No evidence of fracture or dislocation. Small knee joint effusion. Degenerative chondrocalcinosis of a each. Regional vascular calcification. IMPRESSION: No acute or traumatic finding. Small joint effusion. Degenerative chondrocalcinosis. Electronically Signed   By: Paulina Fusi M.D.   On: 04/17/2016 13:35   Dg Knee Complete 4 Views Right  Result Date: 04/17/2016 CLINICAL DATA:  Larey Seat last night.  Dementia. EXAM: RIGHT KNEE - COMPLETE 4+ VIEW COMPARISON:  11/11/2015 FINDINGS: Small joint effusion. No visible fracture. Degenerative chondrocalcinosis of age. Regional vascular calcification. IMPRESSION: No acute or traumatic finding. Small joint effusion. Degenerative chondrocalcinosis. Electronically Signed   By: Paulina Fusi M.D.   On: 04/17/2016 13:36   Dg Hip Unilat With Pelvis 2-3 Views Left  Result Date: 04/17/2016 CLINICAL DATA:  Larey Seat at home this morning. EXAM: DG HIP (WITH OR WITHOUT PELVIS) 2-3V LEFT COMPARISON:  03/23/2015 FINDINGS: There is no evidence of hip fracture or dislocation. There is no evidence of arthropathy or other focal bone abnormality. IMPRESSION: Negative. Electronically Signed   By: Ellery Plunk M.D.   On: 04/17/2016 01:01   Dg Femur, Min 2  Views Right  Result Date: 04/17/2016 CLINICAL DATA:  Dementia up.  Unwitnessed fall last night. EXAM: RIGHT FEMUR 2 VIEWS COMPARISON:  None. FINDINGS: Previous bipolar hip replacement. No evidence of complication. No fracture of the femur. IMPRESSION: Negative. Electronically Signed   By: Paulina Fusi M.D.   On: 04/17/2016 13:37   Dg Femur Port Min 2 Views Left  Result Date: 04/17/2016 CLINICAL DATA:  Dementia up.  Fell last night. EXAM: LEFT FEMUR PORTABLE 2 VIEWS COMPARISON:  None. FINDINGS: There is no evidence of fracture or other focal bone lesions. Soft tissues are unremarkable. IMPRESSION: Negative. Electronically Signed   By: Paulina Fusi M.D.   On: 04/17/2016 13:38   Dg Hips Bilat With Pelvis 3-4 Views  Result Date: 04/17/2016 CLINICAL DATA:  82 y/o F; in unwitnessed fall last night. Complaining of bilateral hip pain greater on the left. EXAM: DG HIP (WITH OR WITHOUT PELVIS) 3-4V BILAT COMPARISON:  None. FINDINGS: No acute fracture or dislocation is identified. Right hip hemi arthroplasty is unchanged in position without apparent hardware related complication. Moderate osteoarthrosis of the left hip with joint space narrowing, fibrocystic degeneration of articular surfaces, and periarticular osteophytes. Visualized symphysis pubis and sacroiliac joints are well maintained. IMPRESSION: No acute fracture or dislocation identified. Electronically Signed   By: Mitzi Hansen M.D.   On: 04/17/2016 13:37    Procedures Procedures (including critical care time)  Medications Ordered in ED Medications  iopamidol (ISOVUE-300) 61 % injection (not administered)  sodium chloride 0.9 % injection (not administered)  acetaminophen (TYLENOL) tablet 650 mg (650 mg Oral Given 04/17/16 1318)  iopamidol (ISOVUE-300) 61 % injection 100 mL (100 mLs Intravenous Contrast Given 04/17/16 1603)     Initial Impression / Assessment and Plan / ED Course  I have reviewed the triage vital signs and the  nursing notes.  Pertinent labs & imaging results that were available during my care of the patient were reviewed by me and considered in my medical decision making (see chart for details).  Clinical Course as of Apr 17 1700  Thu Apr 17, 2016  1203 History is quite limited from patient. Will get ct of abd given tenderness, unknown chronicity. Labs, xrays of legs given pain (unclear how long). CT head since staff reports she's "  altered" (described to EMS as more tired)  [SG]    Clinical Course User Index [SG] Pricilla LovelessScott Dal Blew, MD    Patient's workup is unremarkable. She had a urine evaluated night last night that was clear. She has abdominal tenderness on exam but no abdominal complaints. CT is unremarkable. Her vital signs are stable besides moderate hypertension. No obvious signs of fractures. On repeat exam her abdominal tenderness is much less and I am able to range her hips without significant pain. She does not walk at baseline. I don't think emergent MRI is warranted. Discharge back to facility. She does not appear altered from her baseline and she is not lethargic. F/u with PCP  Final Clinical Impressions(s) / ED Diagnoses   Final diagnoses:  Fall, initial encounter  Fall, initial encounter  Fall, initial encounter  Fall, initial encounter  Fall, initial encounter  Fall, initial encounter  Fall, initial encounter    New Prescriptions New Prescriptions   No medications on file     Pricilla LovelessScott Wise Fees, MD 04/17/16 1704

## 2016-04-17 NOTE — ED Notes (Signed)
[facility] called for transport.  

## 2016-04-17 NOTE — ED Notes (Signed)
Unable to used PT urine for a sample PT made BP while on bedpan

## 2016-04-17 NOTE — ED Notes (Signed)
Patient transported to X-ray 

## 2016-04-17 NOTE — ED Notes (Signed)
[facility] notified regarding pt transportation.

## 2016-04-17 NOTE — ED Notes (Signed)
Bed: [id number] Expected date:  Expected time:  Means of arrival:  Comments: 82 yo, fall

## 2016-04-18 DIAGNOSIS — G0430 Acute necrotizing hemorrhagic encephalopathy, unspecified: Secondary | ICD-10-CM | POA: Diagnosis not present

## 2016-04-19 DIAGNOSIS — G0430 Acute necrotizing hemorrhagic encephalopathy, unspecified: Secondary | ICD-10-CM | POA: Diagnosis not present

## 2016-04-20 DIAGNOSIS — G0430 Acute necrotizing hemorrhagic encephalopathy, unspecified: Secondary | ICD-10-CM | POA: Diagnosis not present

## 2016-04-21 DIAGNOSIS — G301 Alzheimer's disease with late onset: Secondary | ICD-10-CM | POA: Diagnosis not present

## 2016-04-21 DIAGNOSIS — A084 Viral intestinal infection, unspecified: Secondary | ICD-10-CM | POA: Diagnosis not present

## 2016-04-21 DIAGNOSIS — G0430 Acute necrotizing hemorrhagic encephalopathy, unspecified: Secondary | ICD-10-CM | POA: Diagnosis not present

## 2016-04-21 DIAGNOSIS — R296 Repeated falls: Secondary | ICD-10-CM | POA: Diagnosis not present

## 2016-04-21 DIAGNOSIS — F0281 Dementia in other diseases classified elsewhere with behavioral disturbance: Secondary | ICD-10-CM | POA: Diagnosis not present

## 2016-04-22 DIAGNOSIS — G0430 Acute necrotizing hemorrhagic encephalopathy, unspecified: Secondary | ICD-10-CM | POA: Diagnosis not present

## 2016-04-23 DIAGNOSIS — G0430 Acute necrotizing hemorrhagic encephalopathy, unspecified: Secondary | ICD-10-CM | POA: Diagnosis not present

## 2016-04-24 DIAGNOSIS — G0430 Acute necrotizing hemorrhagic encephalopathy, unspecified: Secondary | ICD-10-CM | POA: Diagnosis not present

## 2016-04-25 ENCOUNTER — Emergency Department (HOSPITAL_COMMUNITY): Payer: Commercial Managed Care - HMO

## 2016-04-25 ENCOUNTER — Emergency Department (HOSPITAL_COMMUNITY)
Admission: EM | Admit: 2016-04-25 | Discharge: 2016-04-25 | Disposition: A | Discharge status: Home / Self Care | Payer: Commercial Managed Care - HMO | Attending: Emergency Medicine | Admitting: Emergency Medicine

## 2016-04-25 ENCOUNTER — Encounter (HOSPITAL_COMMUNITY): Payer: Self-pay | Admitting: Emergency Medicine

## 2016-04-25 DIAGNOSIS — R259 Unspecified abnormal involuntary movements: Secondary | ICD-10-CM | POA: Diagnosis not present

## 2016-04-25 DIAGNOSIS — N189 Chronic kidney disease, unspecified: Secondary | ICD-10-CM | POA: Insufficient documentation

## 2016-04-25 DIAGNOSIS — Z87891 Personal history of nicotine dependence: Secondary | ICD-10-CM | POA: Insufficient documentation

## 2016-04-25 DIAGNOSIS — E039 Hypothyroidism, unspecified: Secondary | ICD-10-CM | POA: Insufficient documentation

## 2016-04-25 DIAGNOSIS — Z7982 Long term (current) use of aspirin: Secondary | ICD-10-CM | POA: Diagnosis not present

## 2016-04-25 DIAGNOSIS — J45909 Unspecified asthma, uncomplicated: Secondary | ICD-10-CM | POA: Diagnosis not present

## 2016-04-25 DIAGNOSIS — R52 Pain, unspecified: Secondary | ICD-10-CM | POA: Diagnosis not present

## 2016-04-25 DIAGNOSIS — Z79899 Other long term (current) drug therapy: Secondary | ICD-10-CM | POA: Diagnosis not present

## 2016-04-25 DIAGNOSIS — S0990XA Unspecified injury of head, initial encounter: Secondary | ICD-10-CM | POA: Diagnosis not present

## 2016-04-25 DIAGNOSIS — T148XXA Other injury of unspecified body region, initial encounter: Secondary | ICD-10-CM | POA: Diagnosis not present

## 2016-04-25 DIAGNOSIS — N390 Urinary tract infection, site not specified: Secondary | ICD-10-CM | POA: Diagnosis not present

## 2016-04-25 DIAGNOSIS — R4182 Altered mental status, unspecified: Secondary | ICD-10-CM | POA: Diagnosis not present

## 2016-04-25 DIAGNOSIS — I129 Hypertensive chronic kidney disease with stage 1 through stage 4 chronic kidney disease, or unspecified chronic kidney disease: Secondary | ICD-10-CM | POA: Insufficient documentation

## 2016-04-25 DIAGNOSIS — G0430 Acute necrotizing hemorrhagic encephalopathy, unspecified: Secondary | ICD-10-CM | POA: Diagnosis not present

## 2016-04-25 DIAGNOSIS — S199XXA Unspecified injury of neck, initial encounter: Secondary | ICD-10-CM | POA: Diagnosis not present

## 2016-04-25 DIAGNOSIS — S299XXA Unspecified injury of thorax, initial encounter: Secondary | ICD-10-CM | POA: Diagnosis not present

## 2016-04-25 LAB — URINALYSIS, ROUTINE W REFLEX MICROSCOPIC
BILIRUBIN URINE: NEGATIVE
GLUCOSE, UA: NEGATIVE mg/dL
KETONES UR: NEGATIVE mg/dL
Nitrite: POSITIVE — AB
PH: 7 (ref 5.0–8.0)
Protein, ur: 30 mg/dL — AB
SPECIFIC GRAVITY, URINE: 1.01 (ref 1.005–1.030)

## 2016-04-25 LAB — CBC WITH DIFFERENTIAL/PLATELET
BASOS ABS: 0 10*3/uL (ref 0.0–0.1)
Basophils Relative: 0 %
EOS PCT: 3 %
Eosinophils Absolute: 0.3 10*3/uL (ref 0.0–0.7)
HEMATOCRIT: 35.6 % — AB (ref 36.0–46.0)
Hemoglobin: 12 g/dL (ref 12.0–15.0)
LYMPHS ABS: 1.9 10*3/uL (ref 0.7–4.0)
LYMPHS PCT: 17 %
MCH: 29.2 pg (ref 26.0–34.0)
MCHC: 33.7 g/dL (ref 30.0–36.0)
MCV: 86.6 fL (ref 78.0–100.0)
MONO ABS: 0.8 10*3/uL (ref 0.1–1.0)
Monocytes Relative: 8 %
NEUTROS ABS: 8.2 10*3/uL — AB (ref 1.7–7.7)
Neutrophils Relative %: 72 %
PLATELETS: 208 10*3/uL (ref 150–400)
RBC: 4.11 MIL/uL (ref 3.87–5.11)
RDW: 13.1 % (ref 11.5–15.5)
WBC: 11.2 10*3/uL — ABNORMAL HIGH (ref 4.0–10.5)

## 2016-04-25 LAB — RAPID URINE DRUG SCREEN, HOSP PERFORMED
AMPHETAMINES: NOT DETECTED
BARBITURATES: NOT DETECTED
Benzodiazepines: NOT DETECTED
Cocaine: NOT DETECTED
OPIATES: NOT DETECTED
TETRAHYDROCANNABINOL: NOT DETECTED

## 2016-04-25 LAB — I-STAT CHEM 8, ED
BUN: 20 mg/dL (ref 6–20)
CALCIUM ION: 1.08 mmol/L — AB (ref 1.15–1.40)
CHLORIDE: 103 mmol/L (ref 101–111)
Creatinine, Ser: 0.8 mg/dL (ref 0.44–1.00)
GLUCOSE: 96 mg/dL (ref 65–99)
HCT: 34 % — ABNORMAL LOW (ref 36.0–46.0)
Hemoglobin: 11.6 g/dL — ABNORMAL LOW (ref 12.0–15.0)
POTASSIUM: 5 mmol/L (ref 3.5–5.1)
Sodium: 139 mmol/L (ref 135–145)
TCO2: 30 mmol/L (ref 0–100)

## 2016-04-25 LAB — URINE MICROSCOPIC-ADD ON

## 2016-04-25 LAB — COMPREHENSIVE METABOLIC PANEL
ALT: 21 U/L (ref 14–54)
AST: 35 U/L (ref 15–41)
Albumin: 3.2 g/dL — ABNORMAL LOW (ref 3.5–5.0)
Alkaline Phosphatase: 61 U/L (ref 38–126)
Anion gap: 11 (ref 5–15)
BILIRUBIN TOTAL: 1.2 mg/dL (ref 0.3–1.2)
BUN: 14 mg/dL (ref 6–20)
CALCIUM: 9.4 mg/dL (ref 8.9–10.3)
CHLORIDE: 103 mmol/L (ref 101–111)
CO2: 26 mmol/L (ref 22–32)
CREATININE: 1.02 mg/dL — AB (ref 0.44–1.00)
GFR, EST AFRICAN AMERICAN: 53 mL/min — AB (ref >60)
GFR, EST NON AFRICAN AMERICAN: 46 mL/min — AB (ref >60)
Glucose, Bld: 95 mg/dL (ref 65–99)
Potassium: 4.8 mmol/L (ref 3.5–5.1)
Sodium: 140 mmol/L (ref 135–145)
TOTAL PROTEIN: 6.4 g/dL — AB (ref 6.5–8.1)

## 2016-04-25 LAB — LIPASE, BLOOD: LIPASE: 40 U/L (ref 11–51)

## 2016-04-25 LAB — CBG MONITORING, ED: GLUCOSE-CAPILLARY: 95 mg/dL (ref 65–99)

## 2016-04-25 LAB — MAGNESIUM: Magnesium: 2 mg/dL (ref 1.7–2.4)

## 2016-04-25 LAB — I-STAT TROPONIN, ED: Troponin i, poc: 0.01 ng/mL (ref 0.00–0.08)

## 2016-04-25 LAB — PROTIME-INR
INR: 0.92
PROTHROMBIN TIME: 12.3 s (ref 11.4–15.2)

## 2016-04-25 LAB — ETHANOL

## 2016-04-25 LAB — I-STAT CG4 LACTIC ACID, ED: Lactic Acid, Venous: 1.68 mmol/L (ref 0.5–1.9)

## 2016-04-25 IMAGING — CR DG CHEST 2V
2 series · 2 of 2 positions shown · non-contrast
Comparison: 01/11/2015

CLINICAL DATA: Unwitnessed fall

EXAM:
CHEST  2 VIEW

[x chest ap]
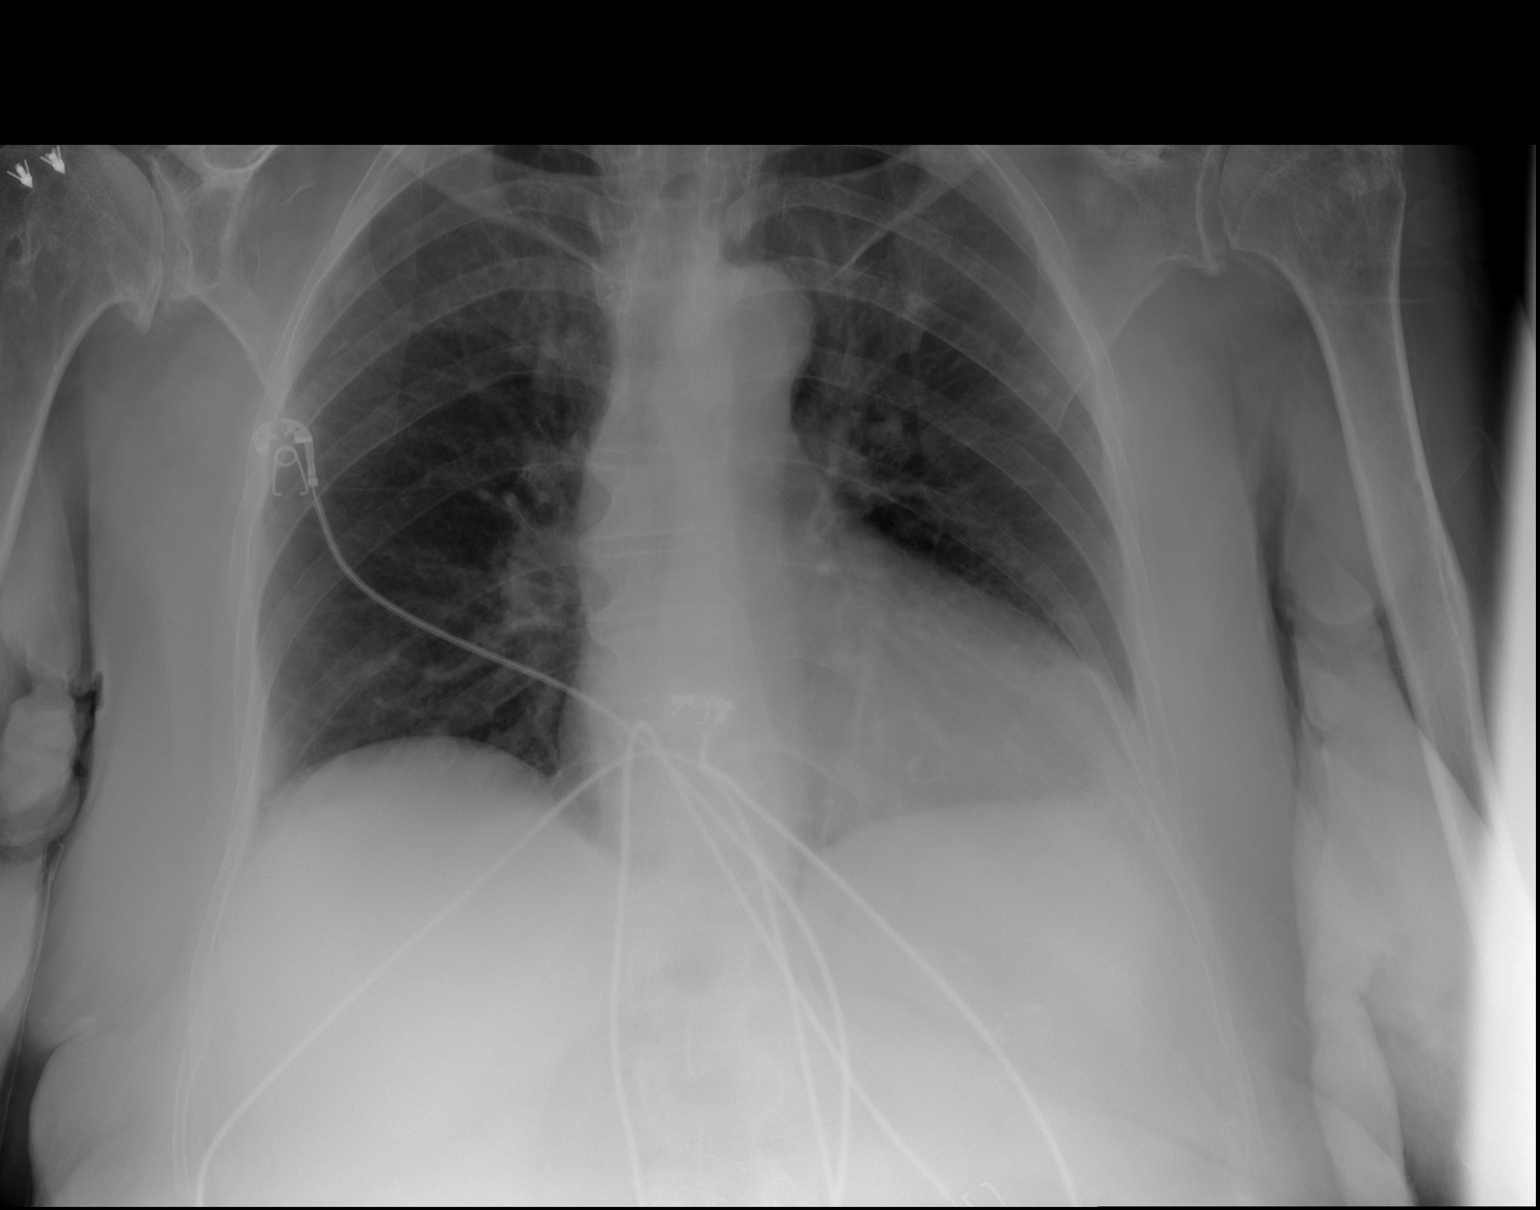

[w chest lat]
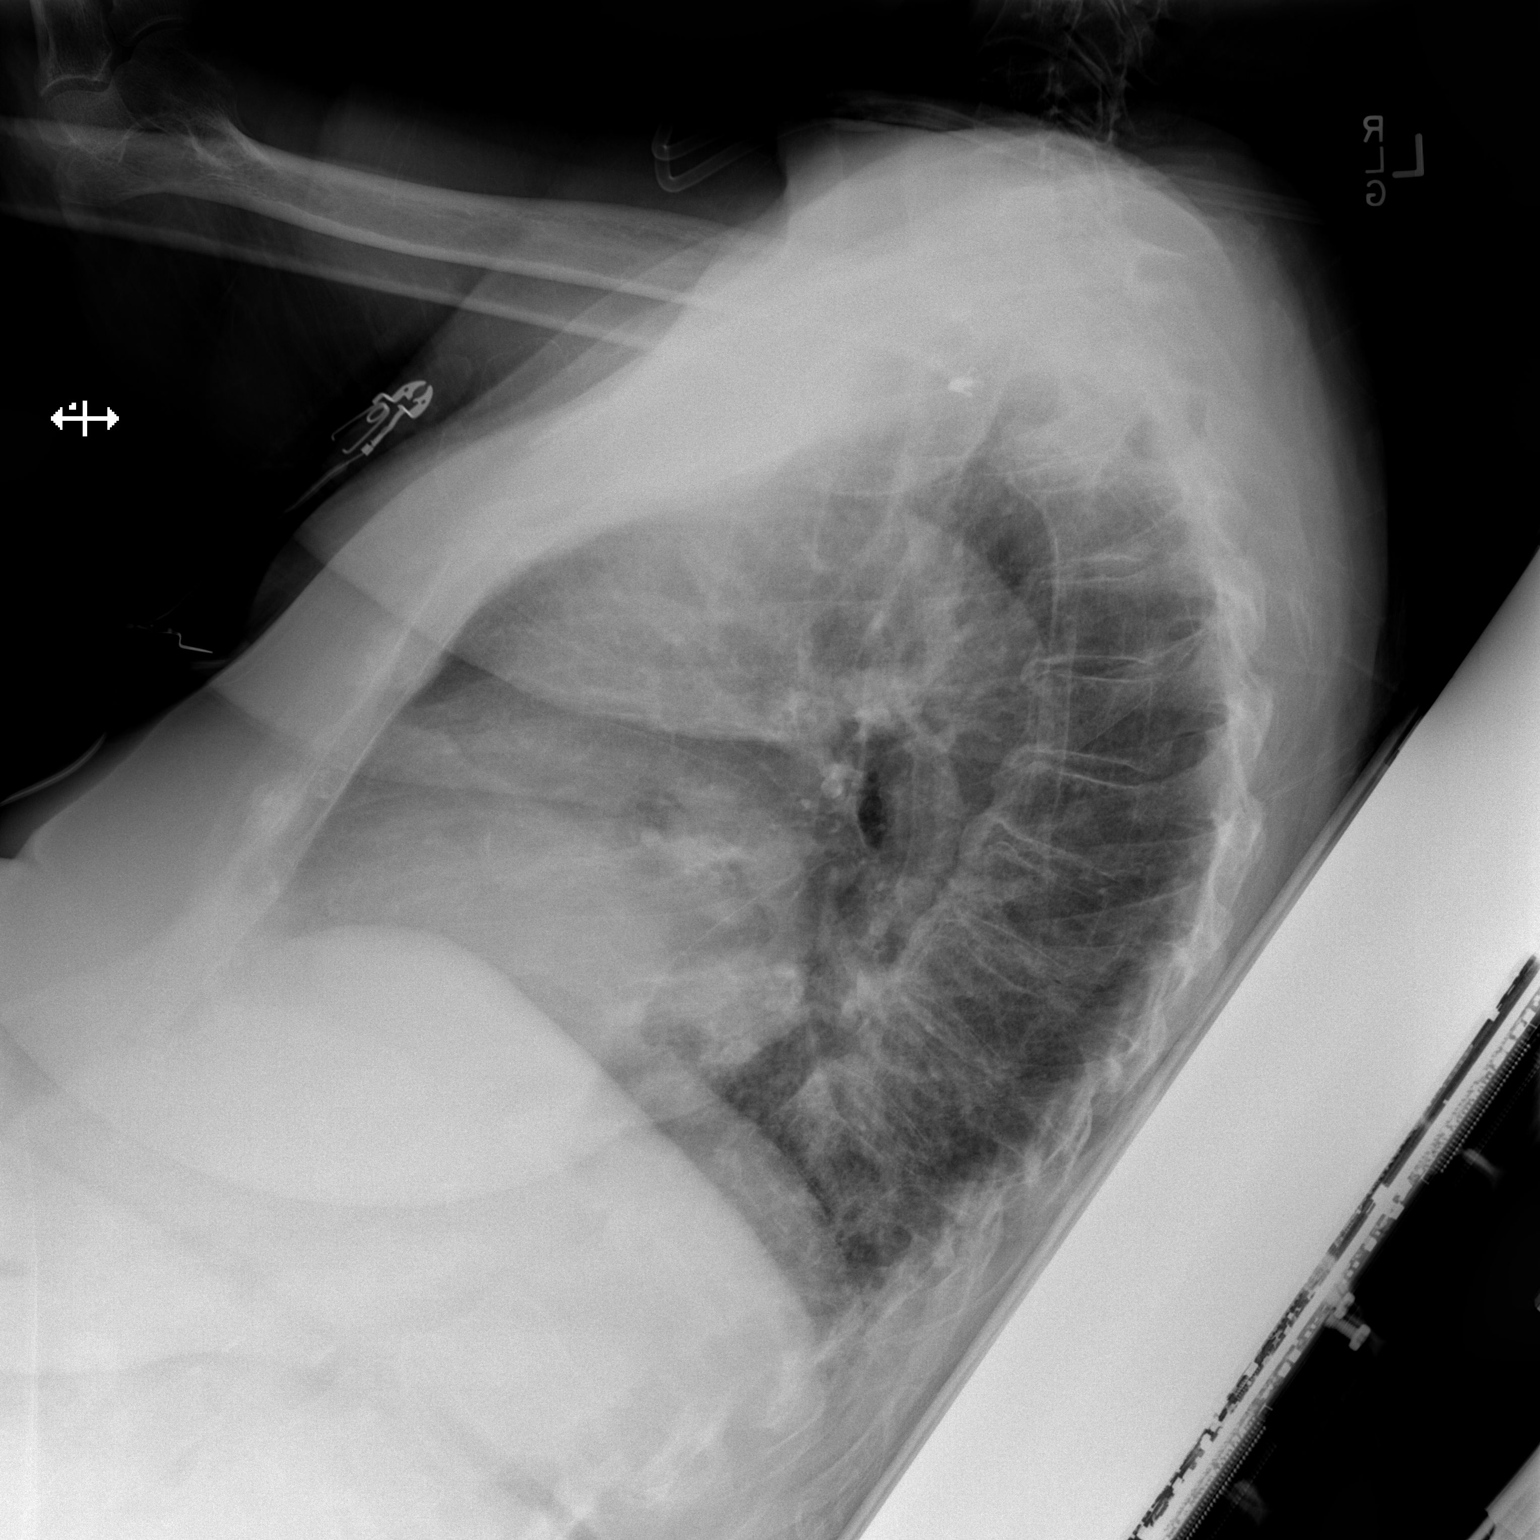

[2 of 2 positions shown; findings below may reference images not displayed]

FINDINGS: Chronic mild cardiomegaly. Negative aortic and hilar contours.
Continued improvement in right lung aeration with resolved right
infrahilar opacity. There is no edema, consolidation, effusion, or
pneumothorax.

No acute osseous findings. Rotator cuff repair with advanced
glenohumeral osteoarthritis on the right.
IMPRESSION: No active cardiopulmonary disease.

## 2016-04-25 IMAGING — CR DG HIP (WITH OR WITHOUT PELVIS) 2-3V*R*
4 series · 4 of 4 positions shown · non-contrast
Comparison: None.

CLINICAL DATA: Unwitnessed fall.

EXAM:
DG HIP (WITH OR WITHOUT PELVIS) 2-3V RIGHT

[x pelvis (1 of 2)]
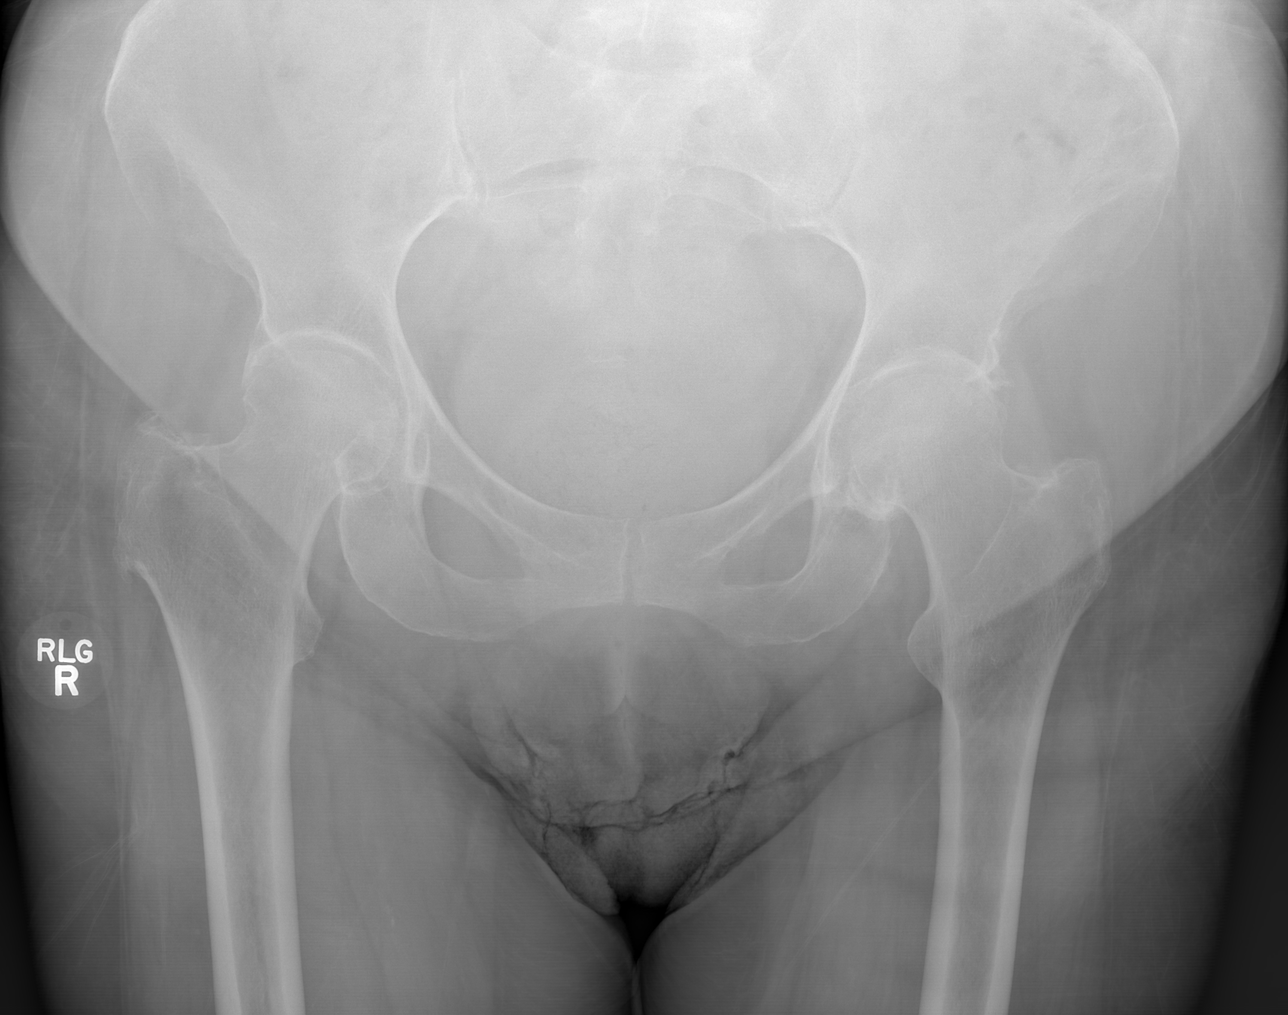

[x pelvis (2 of 2)]
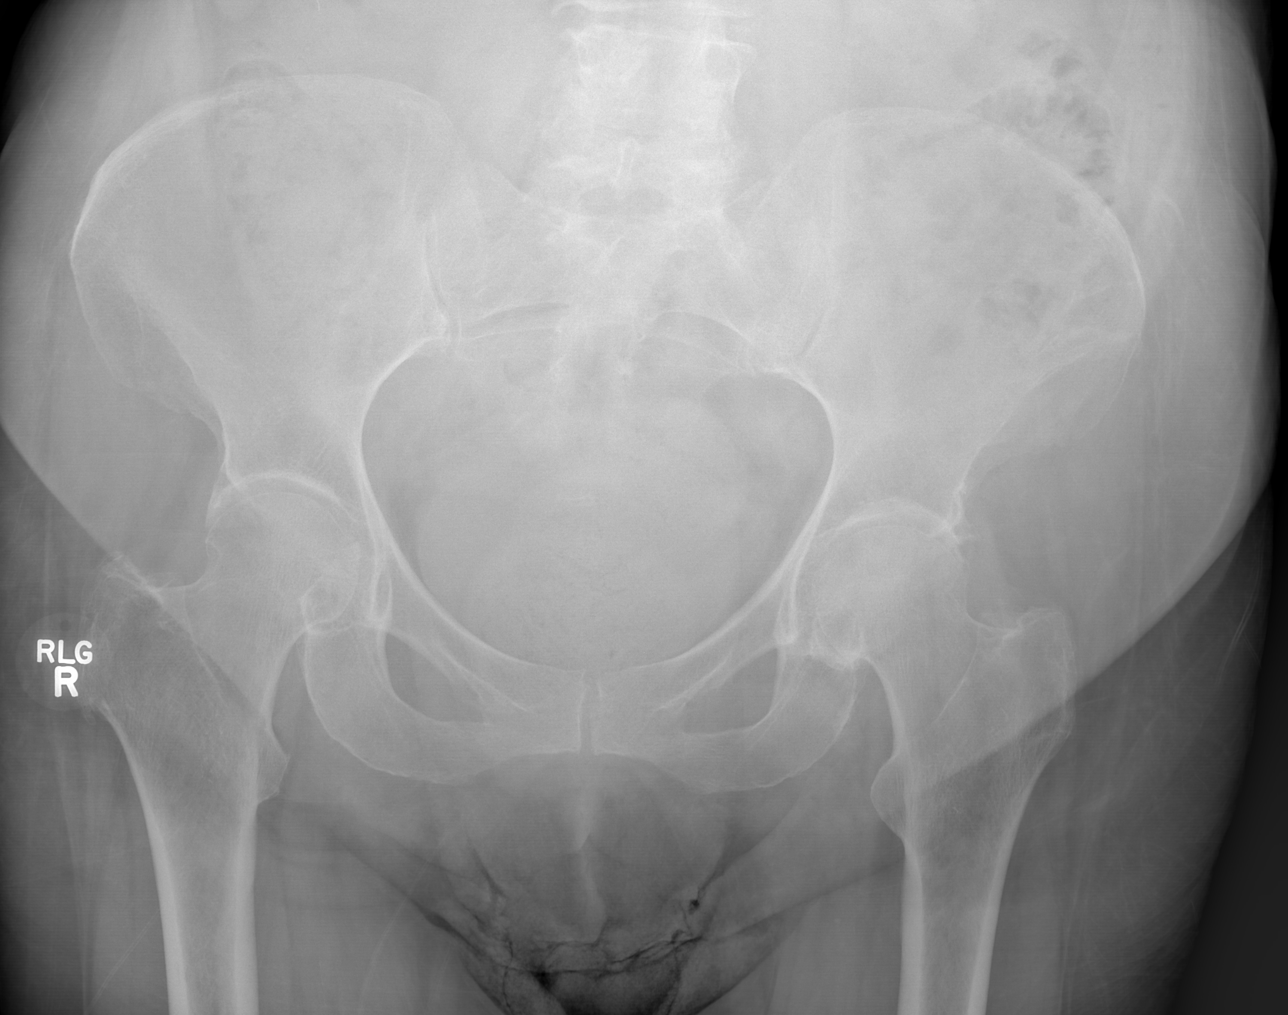

[x hip ap right (1 of 2)]
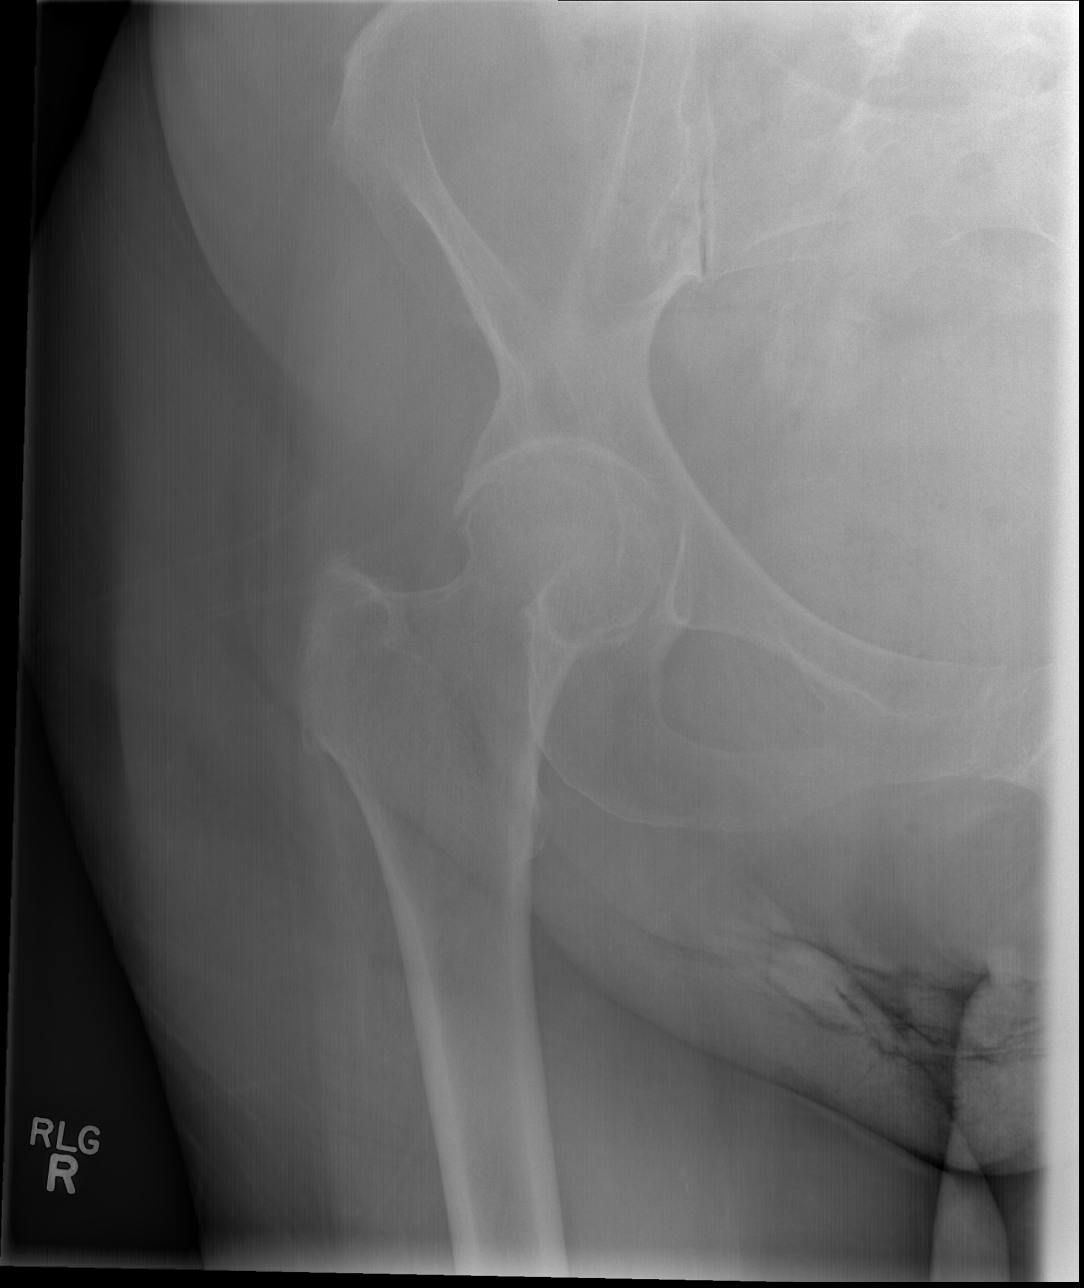

[x hip ap right (2 of 2)]
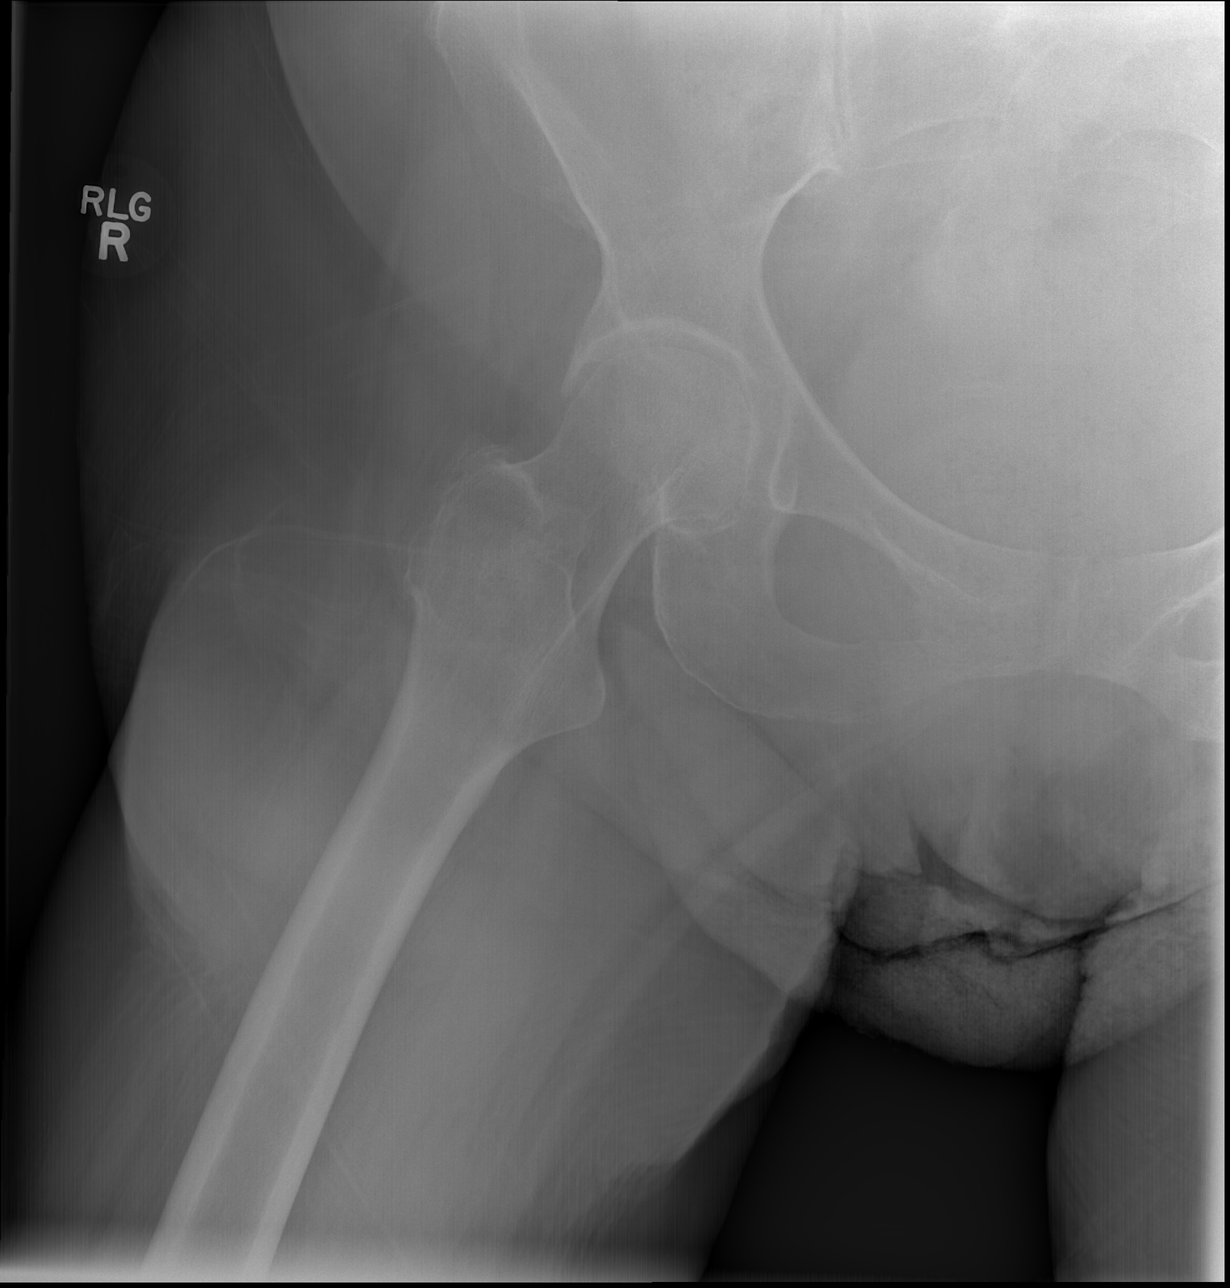

[4 of 4 positions shown; findings below may reference images not displayed]

FINDINGS: Diffuse osteopenia and degenerative change. No acute bony or joint
abnormality identified. No evidence of fracture or dislocation.
IMPRESSION: Diffuse osteopenia degenerative change.  No acute abnormality .

## 2016-04-25 IMAGING — CR DG KNEE COMPLETE 4+V*R*
5 series · 5 of 5 positions shown · non-contrast
Comparison: None.

CLINICAL DATA: Status post fall.  Right knee pain.

EXAM:
RIGHT KNEE - COMPLETE 4+ VIEW

[x knee ap right (1 of 4)]
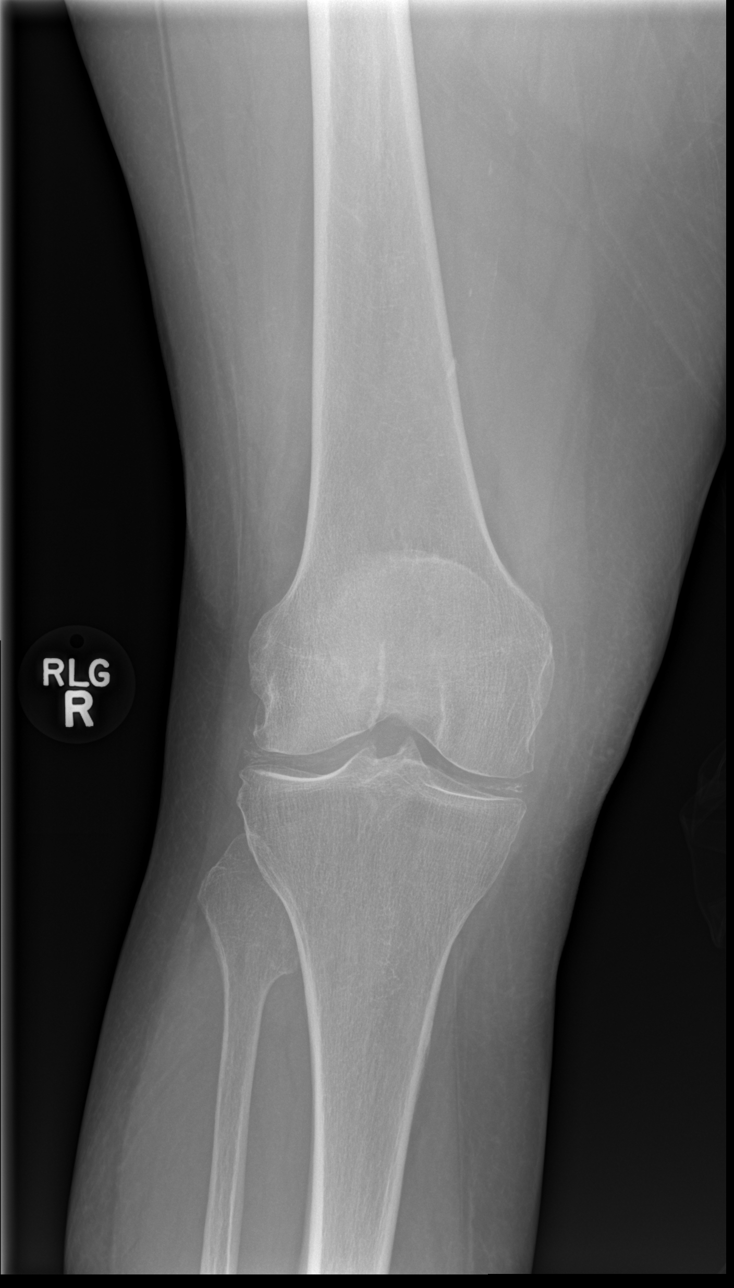

[x knee ap right (2 of 4)]
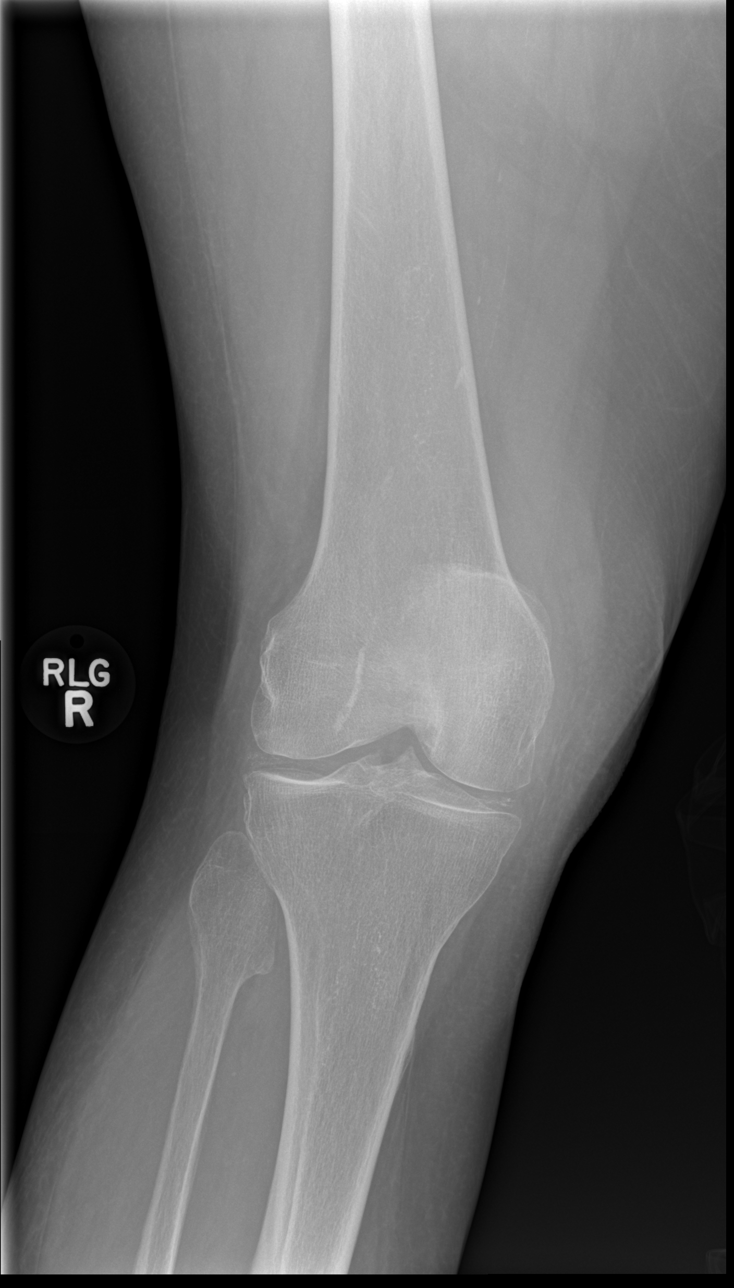

[x knee ap right (3 of 4)]
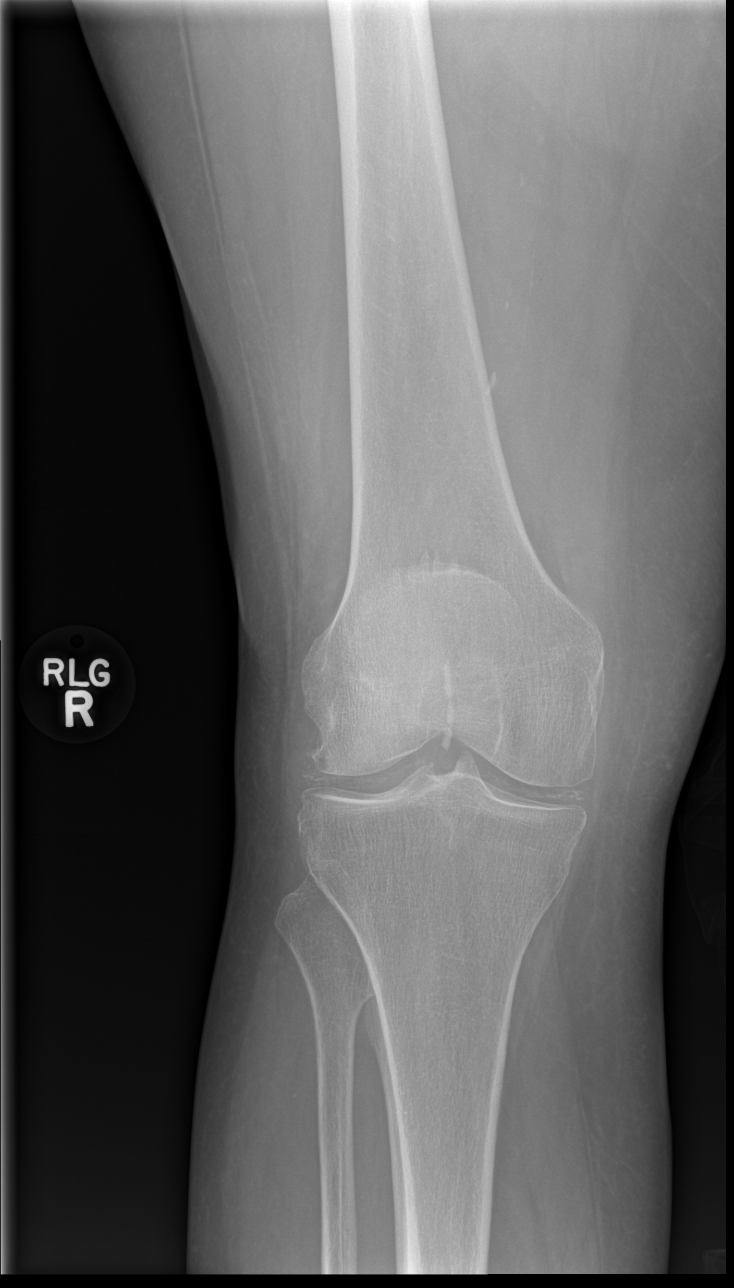

[x knee ap right (4 of 4)]
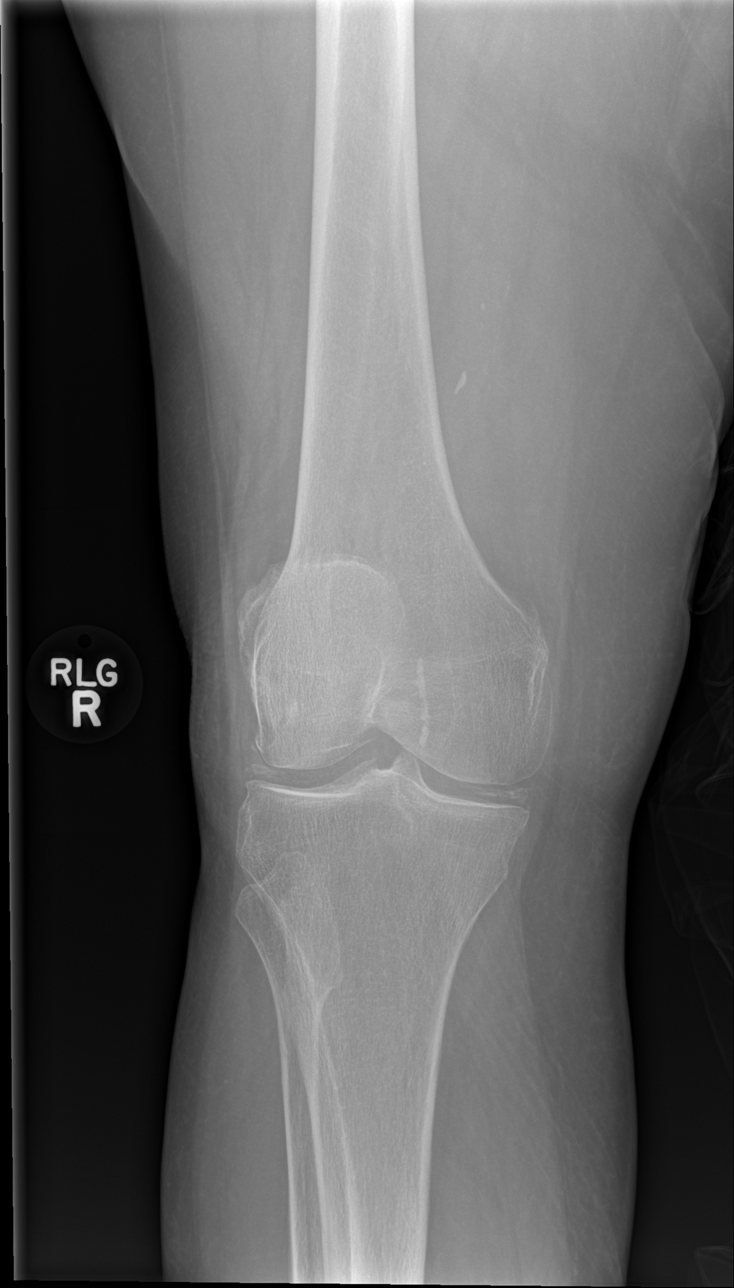

[x knee lat right]
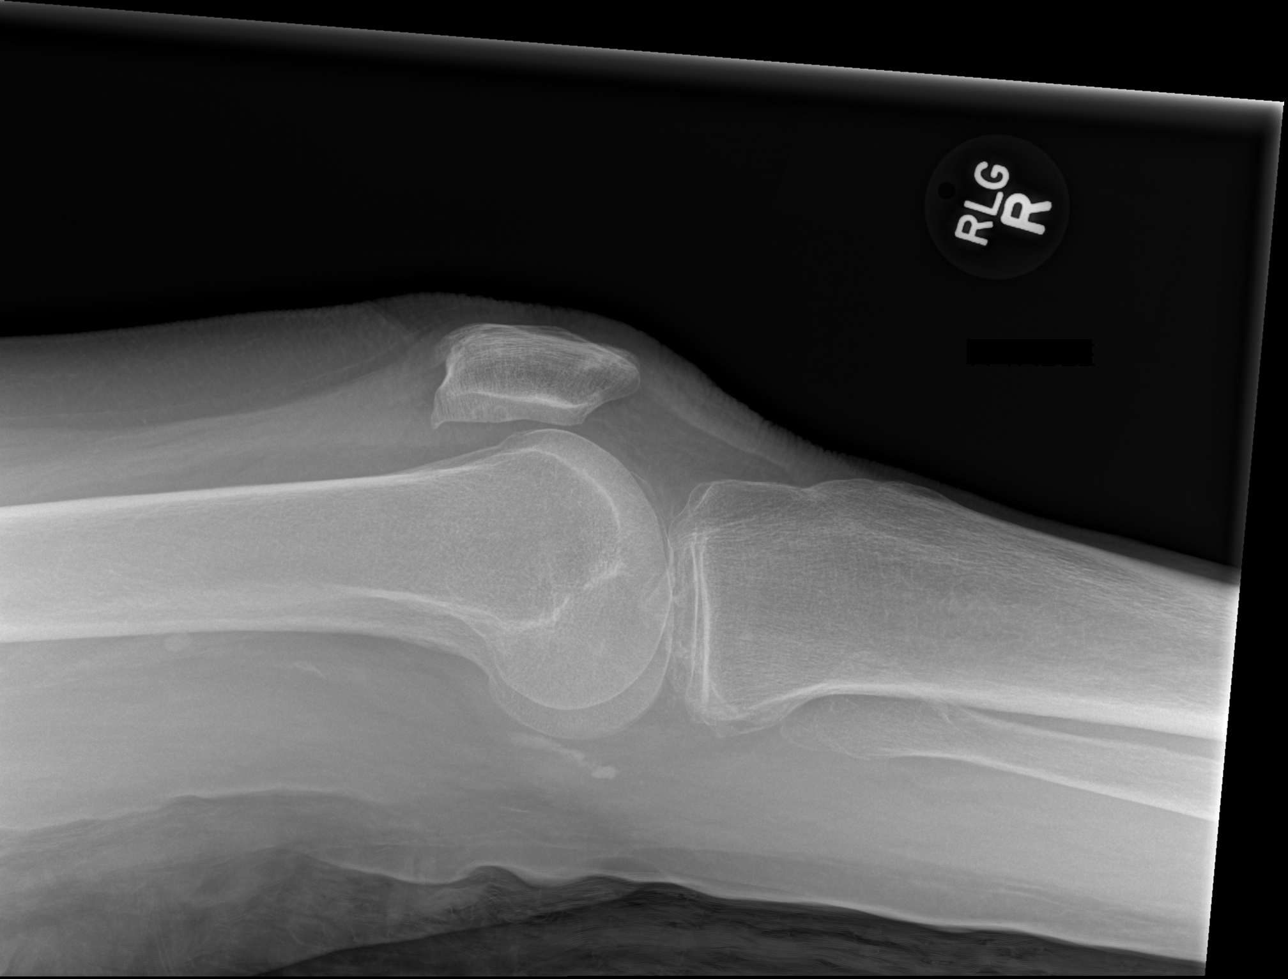

[5 of 5 positions shown; findings below may reference images not displayed]

FINDINGS: There is no evidence of fracture, dislocation, or joint effusion.
There is chondrocalcinosis of the medial and lateral femorotibial
compartments as can be seen with CPPD. There is mild
tricompartmental osteoarthritis. There is generalized osteopenia.
Soft tissues are unremarkable.
IMPRESSION: No acute osseous injury of the right knee.

## 2016-04-25 MED ORDER — CEPHALEXIN 500 MG PO CAPS: 500.0000 mg | ORAL_CAPSULE | Freq: Four times a day (QID) | ORAL | 0 refills | Status: DC

## 2016-04-25 MED ORDER — CEPHALEXIN 250 MG PO CAPS
500.0000 mg | ORAL_CAPSULE | Freq: Once | ORAL | Status: AC
  Administered 2016-04-25: 500 mg via ORAL
  Filled 2016-04-25: qty 2

## 2016-04-25 MED ORDER — SODIUM CHLORIDE 0.9 % IV BOLUS (SEPSIS)
1000.0000 mL | Freq: Once | INTRAVENOUS | Status: AC
  Administered 2016-04-25: 1000 mL via INTRAVENOUS

## 2016-04-25 NOTE — ED Provider Notes (Signed)
[facility]PT Provider Note   CSN: [id number] Arrival date & time: 04/25/16  0532     History   Chief Complaint Chief Complaint  Patient presents with  . Altered Mental Status  . Fall    HPI [name] is a 82 y.o. female.  The history is provided by medical records. The history is limited by the condition of the patient and the absence of a caregiver.  Altered Mental Status    Fall    [name] is a 82 y.o. female  with a PMH of dementia, seizures, CKD, HTN, frequent falls who presents to the Emergency Department via EMS from skilled nursing facility after having unwitnessed fall. Per EMS, patient was found on the floor at 4:15 this morning only responding to painful stimuli. Per nursing facility, patient is typically responsive, talking and will follow basic simple commands. DNR gold sheet at the bedside.    Level V caveat applies secondary to dementia and condition of the patient.   Past Medical History:  Diagnosis Date  . Acute bronchitis 04/10/2013  . Anemia   . Angina   . Anxiety   . Arthritis   . Arthritis 10/02/2012  . Asthma   . Chicken pox   . Chronic kidney disease   . Colon polyps   . Dehydration 08/31/2013  . Dementia   . Depression   . Dysrhythmia   . Headache(784.0)   . Heart murmur   . Hypertension   . Hypothyroidism   . Left leg pain 10/02/2012  . Pneumonia   . Recurrent upper respiratory infection (URI)   . Shortness of breath   . UTI (urinary tract infection) 12/30/2012  . Valvular heart disease 09/04/2012    Patient Active Problem List   Diagnosis Date Noted  . Pressure ulcer 09/20/2015  . Displaced fracture of right femoral neck (HCC) 09/19/2015  . Altered mental status   . Confusion 06/26/2015  . Fall   . Aspiration pneumonia (HCC) 01/10/2015  . Acute encephalopathy 01/08/2015  . UTI (urinary tract infection) 01/08/2015  . 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 Recurrent falls 11/27/2014  . FTT  (failure to thrive) in adult 10/26/2014  . Dehydration 10/26/2014  . Abnormal urinalysis 10/26/2014  . Normocytic anemia 10/26/2014  . Lump of skin of left upper extremity 09/07/2014  . Rash and nonspecific skin eruption 05/08/2014  . Syncope and collapse 11/30/2013  . Low back pain, episodic 11/20/2013  . Chronic kidney disease   . Insomnia 08/31/2013  . Arthritis 10/02/2012  . Valvular heart disease 09/04/2012  . Restless leg syndrome 06/06/2012  . Trochanteric bursitis 08/02/2011  . Dementia 08/02/2011  . Leg pain 03/28/2011  . Hypertension 09/22/2010  . Hypothyroidism 09/22/2010    Past Surgical History:  Procedure Laterality Date  . ABDOMINAL HYSTERECTOMY    . APPENDECTOMY    . BACK SURGERY    . CHOLECYSTECTOMY    . COLONOSCOPY W/ POLYPECTOMY    . EYE SURGERY     cataract removed and eye lids lifted  . feet surgery    . FRACTURE SURGERY     bilateral arms  . HAND SURGERY    . HIP ARTHROPLASTY Right 09/20/2015   Procedure: ARTHROPLASTY RIGHT  BIPOLAR ANTERIOR HIP (HEMIARTHROPLASTY);  Surgeon: Samson Frederic, MD;  Location: [facility];  Service: Orthopedics;  Laterality: Right;  . kidney stones    . SHOULDER ARTHROSCOPY    . TONSILLECTOMY    . TONSILLECTOMY    .  TUBAL LIGATION      OB History    No data available       Home Medications    Prior to Admission medications   Medication Sig Start Date End Date Taking? Authorizing Provider  acetaminophen (TYLENOL) 500 MG tablet Take 500 mg by mouth every 4 (four) hours as needed for mild pain, moderate pain, fever or headache.    Yes Historical Provider, MD  alum & mag hydroxide-simeth (MINTOX) 200-200-20 MG/5ML suspension Take 30 mLs by mouth every 6 (six) hours as needed for indigestion or heartburn.    Yes Historical Provider, MD  aspirin 81 MG chewable tablet Chew 81 mg by mouth daily.   Yes Historical Provider, MD  clonazePAM (KLONOPIN) 0.5 MG tablet Take 1 tablet (0.5 mg total) by mouth 2 (two) times  daily. Patient taking differently: Take 0.25-0.5 mg by mouth 3 (three) times daily. Take 0.5 bid daily at 0800 and 2000. Take 0.25 daily at1400 09/23/15  Yes Elease EtienneAnand D Hongalgi, MD  divalproex (DEPAKOTE SPRINKLE) 125 MG capsule Take 125 mg by mouth 3 (three) times daily.    Yes Historical Provider, MD  escitalopram (LEXAPRO) 10 MG tablet Take 10 mg by mouth daily.   Yes Historical Provider, MD  guaifenesin (ROBITUSSIN) 100 MG/5ML syrup Take 200 mg by mouth every 6 (six) hours as needed for cough.    Yes Historical Provider, MD  levothyroxine (SYNTHROID, LEVOTHROID) 50 MCG tablet Take 50 mcg by mouth daily before breakfast.   Yes Historical Provider, MD  loperamide (IMODIUM) 2 MG capsule Take 2 mg by mouth as needed for diarrhea or loose stools.    Yes Historical Provider, MD  magnesium hydroxide (MILK OF MAGNESIA) 400 MG/5ML suspension Take 30 mLs by mouth at bedtime as needed for mild constipation.   Yes Historical Provider, MD  Multiple Vitamins-Minerals (MULTIVITAMIN WITH MINERALS) tablet Take 1 tablet by mouth daily.   Yes Historical Provider, MD  neomycin-bacitracin-polymyxin (NEOSPORIN) ointment Apply 1 application topically daily as needed for wound care.    Yes Historical Provider, MD  Nutritional Supplements (NUTRITIONAL DRINK) LIQD Take 1 Bottle by mouth 3 (three) times daily with meals. Mighty Shakes   Yes Historical Provider, MD  ondansetron (ZOFRAN-ODT) 8 MG disintegrating tablet Take 8 mg by mouth every 8 (eight) hours as needed for nausea or vomiting.   Yes Historical Provider, MD  rivastigmine (EXELON) 3 MG capsule Take 3 mg by mouth 2 (two) times daily.   Yes Historical Provider, MD  senna (SENOKOT) 8.6 MG TABS tablet Take 2 tablets by mouth 2 (two) times daily.   Yes Historical Provider, MD  Skin Protectants, Misc. (BAZA PROTECT EX) Apply 1 application topically as needed (after each diaper change).   Yes Historical Provider, MD  traMADol (ULTRAM) 50 MG tablet Take 1 tablet (50 mg  total) by mouth every 6 (six) hours as needed for severe pain. 09/23/15  Yes Elease EtienneAnand D Hongalgi, MD  traZODone (DESYREL) 50 MG tablet Take 50 mg by mouth at bedtime.    Yes Historical Provider, MD  cephALEXin (KEFLEX) 500 MG capsule Take 1 capsule (500 mg total) by mouth 4 (four) times daily. 04/25/16   Chase PicketJaime Pilcher Tala Eber, PA-C    Family History Family History  Problem Relation Age of Onset  . Heart disease Mother   . Heart disease Father   . Heart disease Other   . Birth defects Other     Social History Social History  Substance Use Topics  . Smoking status: Former  Smoker    Packs/day: 0.20    Years: 1.00    Types: Cigarettes    Quit date: 06/02/1941  . Smokeless tobacco: Never Used  . Alcohol use No     Allergies   Morphine and related; Codeine; and Hydrocodone   Review of Systems Review of Systems  Unable to perform ROS: Mental status change     Physical Exam Updated Vital Signs BP 177/61   Pulse 65   Temp 97.6 F (36.4 C) (Rectal)   Resp 16   Ht 5\' 5"  (1.651 m)   Wt 80.3 kg   SpO2 98%   BMI 29.45 kg/m   Physical Exam  Constitutional: She appears well-developed and well-nourished.  HENT:  Head: Normocephalic and atraumatic.  Cardiovascular: Normal rate, regular rhythm and normal heart sounds.   No murmur heard. Pulmonary/Chest: Effort normal and breath sounds normal. No respiratory distress. She has no wheezes. She has no rales.  Abdominal: Soft. She exhibits no distension. There is no tenderness.  Musculoskeletal: She exhibits no edema.  Neurological: GCS eye subscore is 1. GCS verbal subscore is 1. GCS motor subscore is 5.  Responds to painful stimuli. Gag reflex intact.   Skin: Skin is warm and dry.  Nursing note and vitals reviewed.    ED Treatments / Results  Labs (all labs ordered are listed, but only abnormal results are displayed) Labs Reviewed  CBC WITH DIFFERENTIAL/PLATELET - Abnormal; Notable for the following:       Result Value   WBC  11.2 (*)    HCT 35.6 (*)    Neutro Abs 8.2 (*)    All other components within normal limits  COMPREHENSIVE METABOLIC PANEL - Abnormal; Notable for the following:    Creatinine, Ser 1.02 (*)    Total Protein 6.4 (*)    Albumin 3.2 (*)    GFR calc non Af Amer 46 (*)    GFR calc Af Amer 53 (*)    All other components within normal limits  URINALYSIS, ROUTINE W REFLEX MICROSCOPIC (NOT AT [facility]) - Abnormal; Notable for the following:    APPearance CLOUDY (*)    Hgb urine dipstick MODERATE (*)    Protein, ur 30 (*)    Nitrite POSITIVE (*)    Leukocytes, UA LARGE (*)    All other components within normal limits  URINE MICROSCOPIC-ADD ON - Abnormal; Notable for the following:    Squamous Epithelial / LPF 0-5 (*)    Bacteria, UA MANY (*)    All other components within normal limits  I-STAT CHEM 8, ED - Abnormal; Notable for the following:    Calcium, Ion 1.08 (*)    Hemoglobin 11.6 (*)    HCT 34.0 (*)    All other components within normal limits  URINE CULTURE  CULTURE, BLOOD (ROUTINE X 2)  CULTURE, BLOOD (ROUTINE X 2)  LIPASE, BLOOD  PROTIME-INR  MAGNESIUM  RAPID URINE DRUG SCREEN, HOSP PERFORMED  ETHANOL  I-STAT TROPOININ, ED  I-STAT CG4 LACTIC ACID, ED  CBG MONITORING, ED    EKG  EKG Interpretation  Date/Time:  Friday April 25 2016 05:41:47 EST Ventricular Rate:  66 PR Interval:    QRS Duration: 158 QT Interval:  467 QTC Calculation: 490 R Axis:   -59 Text Interpretation:  Sinus rhythm Prolonged PR interval RBBB and LAFB Baseline wander in lead(s) V3 No significant change since last tracing Confirmed by Erroll Luna [phone]) on 04/25/2016 5:54:05 AM Also confirmed by Erroll Luna [phone]), editor  Kristeen Mans [phone])  on 04/25/2016 8:11:26 AM       Radiology Ct Head Wo Contrast  Result Date: 04/25/2016 CLINICAL DATA:  Unwitnessed fall. Altered mental status. History of seizures. Initial encounter. EXAM: CT HEAD WITHOUT CONTRAST CT CERVICAL  SPINE WITHOUT CONTRAST TECHNIQUE: Multidetector CT imaging of the head and cervical spine was performed following the standard protocol without intravenous contrast. Multiplanar CT image reconstructions of the cervical spine were also generated. COMPARISON:  04/17/2016 FINDINGS: CT HEAD FINDINGS Brain: There is no evidence of acute cortical infarct, intracranial hemorrhage, mass, midline shift, or extra-axial fluid collection. Cerebral atrophy and mild to moderate chronic small vessel ischemic change in the cerebral white matter are stable. Vascular: Calcified atherosclerosis at the skullbase. Skull: No fracture or focal osseous lesion. Sinuses/Orbits: Mild mucosal thickening and small volume secretions in the left sphenoid sinus. Clear mastoid air cells. Prior bilateral cataract extraction. Other: None. CT CERVICAL SPINE FINDINGS Alignment: No evidence of acute traumatic subluxation. Trace retrolisthesis of C5 on C6 is likely degenerative. Skull base and vertebrae: No acute fracture or aggressive osseous process is identified. A few small scattered lucencies in the cervical vertebral bodies are similar in appearance to a 12/15/2006 study and likely benign. Soft tissues and spinal canal: No prevertebral fluid or swelling. No visible canal hematoma. Disc levels: Stable appearance of mild multilevel disc degeneration. Upper chest: Unremarkable. Other: Bilateral carotid bifurcation atherosclerosis. IMPRESSION: 1. No evidence of acute intracranial abnormality. 2. Moderate chronic small vessel ischemic disease. 3. No evidence of acute cervical spine fracture. Electronically Signed   By: Sebastian Ache M.D.   On: 04/25/2016 07:24   Ct Cervical Spine Wo Contrast  Result Date: 04/25/2016 CLINICAL DATA:  Unwitnessed fall. Altered mental status. History of seizures. Initial encounter. EXAM: CT HEAD WITHOUT CONTRAST CT CERVICAL SPINE WITHOUT CONTRAST TECHNIQUE: Multidetector CT imaging of the head and cervical spine was  performed following the standard protocol without intravenous contrast. Multiplanar CT image reconstructions of the cervical spine were also generated. COMPARISON:  04/17/2016 FINDINGS: CT HEAD FINDINGS Brain: There is no evidence of acute cortical infarct, intracranial hemorrhage, mass, midline shift, or extra-axial fluid collection. Cerebral atrophy and mild to moderate chronic small vessel ischemic change in the cerebral white matter are stable. Vascular: Calcified atherosclerosis at the skullbase. Skull: No fracture or focal osseous lesion. Sinuses/Orbits: Mild mucosal thickening and small volume secretions in the left sphenoid sinus. Clear mastoid air cells. Prior bilateral cataract extraction. Other: None. CT CERVICAL SPINE FINDINGS Alignment: No evidence of acute traumatic subluxation. Trace retrolisthesis of C5 on C6 is likely degenerative. Skull base and vertebrae: No acute fracture or aggressive osseous process is identified. A few small scattered lucencies in the cervical vertebral bodies are similar in appearance to a 12/15/2006 study and likely benign. Soft tissues and spinal canal: No prevertebral fluid or swelling. No visible canal hematoma. Disc levels: Stable appearance of mild multilevel disc degeneration. Upper chest: Unremarkable. Other: Bilateral carotid bifurcation atherosclerosis. IMPRESSION: 1. No evidence of acute intracranial abnormality. 2. Moderate chronic small vessel ischemic disease. 3. No evidence of acute cervical spine fracture. Electronically Signed   By: Sebastian Ache M.D.   On: 04/25/2016 07:24   Dg Chest Port 1 View  Result Date: 04/25/2016 CLINICAL DATA:  Unwitnessed fall at about 4:15 a.m. today. Altered mental status subsequently. History of hypertension, asthma, pneumonia, dementia, bronchitis. EXAM: PORTABLE CHEST 1 VIEW COMPARISON:  01/09/2016 FINDINGS: Normal heart size and pulmonary vascularity. No focal airspace disease or consolidation in the  lungs. No blunting of  costophrenic angles. No pneumothorax. Mediastinal contours appear intact. Degenerative changes in the spine and shoulders. Postoperative change in the right shoulder. IMPRESSION: No active disease. Electronically Signed   By: Burman NievesWilliam  Stevens M.D.   On: 04/25/2016 06:08    Procedures Procedures (including critical care time)  Medications Ordered in ED Medications  sodium chloride 0.9 % bolus 1,000 mL (0 mLs Intravenous Stopped 04/25/16 0700)  cephALEXin (KEFLEX) capsule 500 mg (500 mg Oral Given 04/25/16 0931)     Initial Impression / Assessment and Plan / ED Course  I have reviewed the triage vital signs and the nursing notes.  Pertinent labs & imaging results that were available during my care of the patient were reviewed by me and considered in my medical decision making (see chart for details).  Clinical Course    [name] is a 82 y.o. female who presents to ED from nursing facility for unwitnessed fall found down just prior to arrival and altered mental status. Unknown last known normal. On exam, GCS of 7. Patient will respond and localize pain. Maintaining airway with good gag reflex. EKG reviewed with no change from prior tracing. All blood work reviewed and reassuring. CXR negative and CT head/neck with no signs of acute injury. Patient does have hx of seizures. Given unremarkable workup thus far etiology likely seizure vs. UTI. Will continue to monitor and awaiting urine sample.   8:45 AM - Patient re-evaluated and appears back to baseline mental status. Likely had seizure. She is now alert and oriented to self. Able to answer basic questions and follow simple commands. She does complain of neck pain but no other complaints at present.   UA shows nitrite +, large leuks, with TNTC white cells. Will treat with Keflex.  Safe for discharge back to facility. PCP follow up for recheck of symptoms. Follow up care, home care and return precautions included on discharge paperwork for  facility.   Patient seen by and discussed with Dr. Particia NearingHaviland who agrees with treatment plan.   Final Clinical Impressions(s) / ED Diagnoses   Final diagnoses:  Lower urinary tract infectious disease    New Prescriptions New Prescriptions   CEPHALEXIN (KEFLEX) 500 MG CAPSULE    Take 1 capsule (500 mg total) by mouth 4 (four) times daily.     [facility]-GeorgiaJaime Pilcher Kiernan Farkas, PA-C 04/25/16 [id number]    Jacalyn LefevreJulie Haviland, MD 04/25/16 1145

## 2016-04-25 NOTE — ED Triage Notes (Signed)
[facility] having an unwitnessed fall at  Nursing home pt found on the floor at 0415 by SNF staff only responding to pain stimulus, according to staff pt baseline is usually talking. DNR pt with hx of seizures, pinpoint pupils on EMS arrival no history of any narcotics given.

## 2016-04-25 NOTE — ED Notes (Signed)
Yancey Flemingsontacted Alisha, [facility] and discussed discharge instructions, prescriptions and follow-up care. No further questions/concerns. Tiffany, Diplomatic Services operational officersecretary at [facility] to [facility].

## 2016-04-25 NOTE — ED Notes (Signed)
RN entered room to hourly round, pt now alert, communicating verbally, and oriented to self, situation and place. Explained why pt was in ED and plan of care to pt. Pt has no questions/concerns at this time.

## 2016-04-25 NOTE — Discharge Instructions (Signed)
Please take antibiotic as directed for urinary tract infection. Follow up with your primary care provider next week for recheck of symptoms. You had a CT of your head and cervical spine today that showed no sign of acute injury. Return to ER for new or worsening symptoms, any additional concerns.

## 2016-04-25 NOTE — ED Notes (Signed)
This RN and Gabriel RungMonique, RN made aware by Leonette Mostharles, EMT that pt was attempting to climb out of stretcher during shift change. Pt had episode of incontinence of stool and urine. Staff assisted pt to chair and change linens. Pt eyes closed, not answering questions, not alert to situation/time/place. Pt cleaned up by staff and assisted back to stretcher.

## 2016-04-25 NOTE — ED Notes (Signed)
[facility] arrived to transport pt to facility  

## 2016-04-26 DIAGNOSIS — G0430 Acute necrotizing hemorrhagic encephalopathy, unspecified: Secondary | ICD-10-CM | POA: Diagnosis not present

## 2016-04-27 DIAGNOSIS — G0430 Acute necrotizing hemorrhagic encephalopathy, unspecified: Secondary | ICD-10-CM | POA: Diagnosis not present

## 2016-04-27 LAB — URINE CULTURE

## 2016-04-28 ENCOUNTER — Telehealth (HOSPITAL_BASED_OUTPATIENT_CLINIC_OR_DEPARTMENT_OTHER): Payer: Self-pay | Admitting: Emergency Medicine

## 2016-04-28 DIAGNOSIS — R296 Repeated falls: Secondary | ICD-10-CM | POA: Diagnosis not present

## 2016-04-28 DIAGNOSIS — G0430 Acute necrotizing hemorrhagic encephalopathy, unspecified: Secondary | ICD-10-CM | POA: Diagnosis not present

## 2016-04-28 DIAGNOSIS — N390 Urinary tract infection, site not specified: Secondary | ICD-10-CM | POA: Diagnosis not present

## 2016-04-28 DIAGNOSIS — F0281 Dementia in other diseases classified elsewhere with behavioral disturbance: Secondary | ICD-10-CM | POA: Diagnosis not present

## 2016-04-28 DIAGNOSIS — G309 Alzheimer's disease, unspecified: Secondary | ICD-10-CM | POA: Diagnosis not present

## 2016-04-28 NOTE — Telephone Encounter (Addendum)
Post ED Visit - Positive Culture Follow-up  Culture report reviewed by antimicrobial stewardship pharmacist:  []  Enzo BiNathan Batchelder, Pharm.D. []  Celedonio MiyamotoJeremy Frens, Pharm.D., BCPS []  Garvin FilaMike Maccia, Pharm.D. []  Georgina PillionElizabeth Martin, Pharm.D., BCPS []  SoldierMinh Pham, [address]Pharm.D., BCPS, AAHIVP []  Estella HuskMichelle Turner, Pharm.D., BCPS, AAHIVP []  Cassie Stewart, Pharm.D. []  Sherle Poeob Vincent, [address]Pharm[address] Devota Pacearoline Welles PharmD  Positive urine culture Treated with cephalexin, organism sensitive to the same and no further patient follow-up is required at this time.  Berle MullMiller, Oden Lindaman 04/28/2016, 10:22 AM

## 2016-04-29 DIAGNOSIS — G0430 Acute necrotizing hemorrhagic encephalopathy, unspecified: Secondary | ICD-10-CM | POA: Diagnosis not present

## 2016-04-30 DIAGNOSIS — G0430 Acute necrotizing hemorrhagic encephalopathy, unspecified: Secondary | ICD-10-CM | POA: Diagnosis not present

## 2016-04-30 DIAGNOSIS — M25562 Pain in left knee: Secondary | ICD-10-CM | POA: Diagnosis not present

## 2016-04-30 LAB — CULTURE, BLOOD (ROUTINE X 2)
Culture: NO GROWTH
Culture: NO GROWTH

## 2016-05-01 DIAGNOSIS — G0430 Acute necrotizing hemorrhagic encephalopathy, unspecified: Secondary | ICD-10-CM | POA: Diagnosis not present

## 2016-05-01 IMAGING — DX DG CHEST 1V
1 series · 1 of 1 positions shown · non-contrast
Comparison: 02/06/2015

CLINICAL DATA: Patient was found outside on the ground. Patient
lives in a memory care unit. Unknown if she fell from a standing
position or from her wheelchair. Hematoma on the head.

EXAM:
CHEST  1 VIEW

[chest ap]
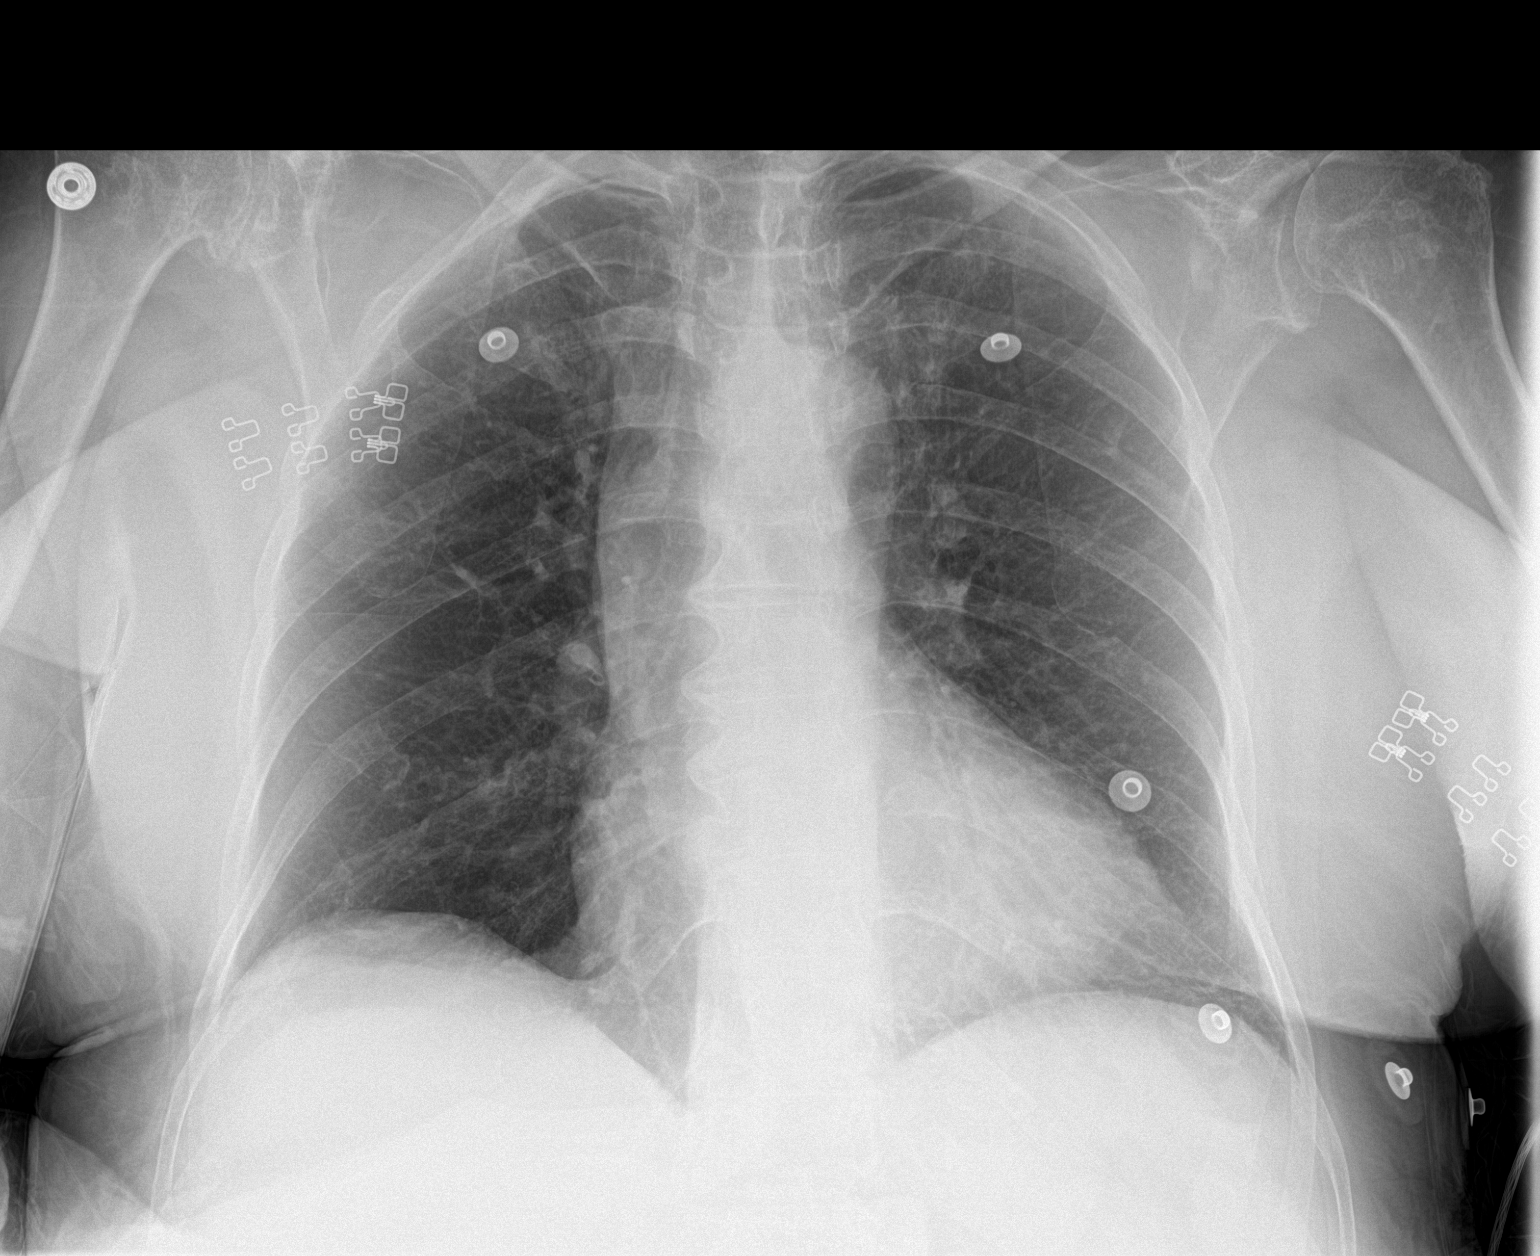

[1 of 1 positions shown; findings below may reference images not displayed]

FINDINGS: The heart is mildly enlarged. The lungs are clear. There are no
focal consolidations or pleural effusions. There are chronic changes
in the shoulders bilaterally. Moderate spondylosis identified in the
thoracic spine.
IMPRESSION: Cardiomegaly.  No evidence for acute  abnormality.

## 2016-05-01 IMAGING — CT CT HEAD W/O CM
3 of 5 series · 14 of 47 positions shown, 16 images · non-contrast
Comparison: 01/07/2015

CLINICAL DATA: Unwitnessed fall. Confused patient. Hematoma to the
back of the head.

EXAM:
CT HEAD WITHOUT CONTRAST
CT CERVICAL SPINE WITHOUT CONTRAST
TECHNIQUE: Multidetector CT imaging of the head and cervical spine was
performed following the standard protocol without intravenous
contrast. Multiplanar CT image reconstructions of the cervical spine
were also generated.

[Series 8: coronals · coronal · 0.31mm/px · 3 of 45 slices shown]
[im 15/45  brain]
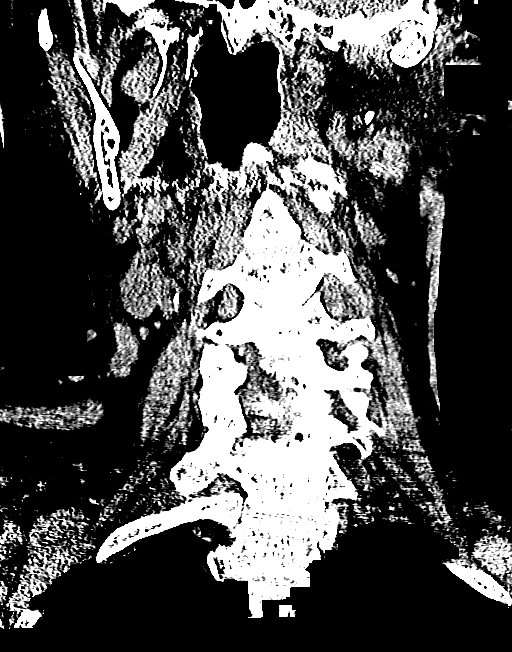
[im 20/45  brain]
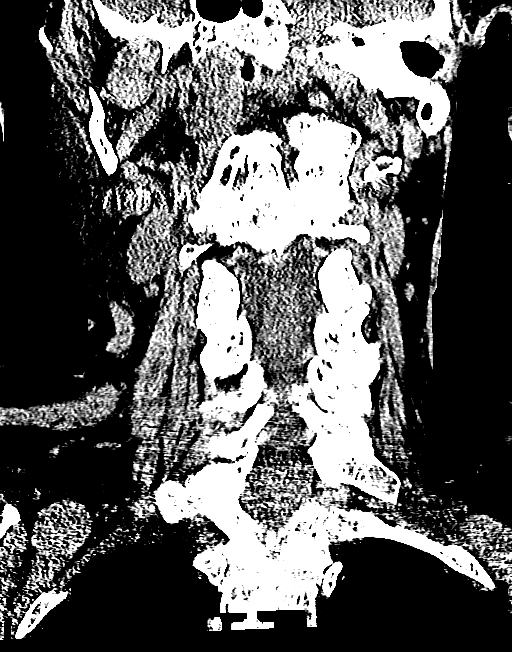
[im 25/45  brain]
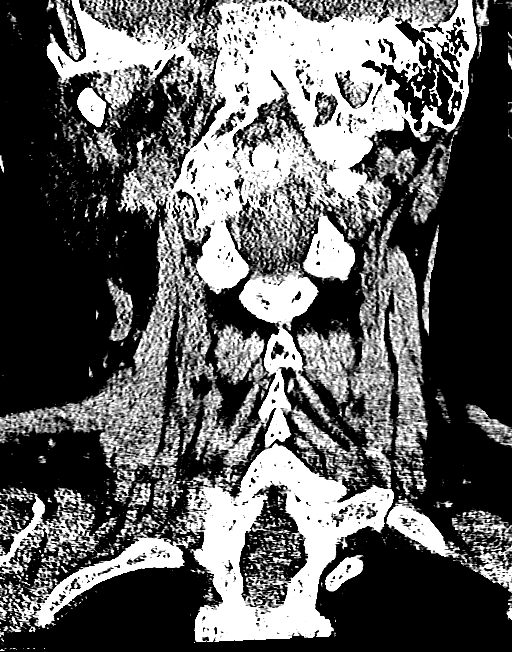

[Series 9: sagittals · sagittal · 0.31mm/px · 3 of 62 slices shown]
[im 21/62  brain]
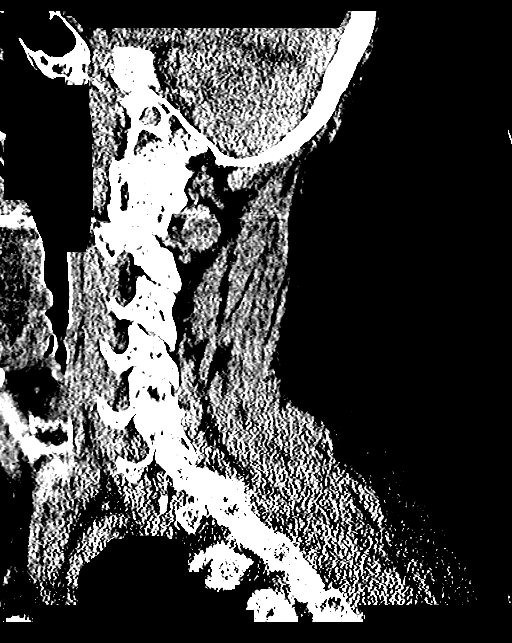
[im 31/62  brain]
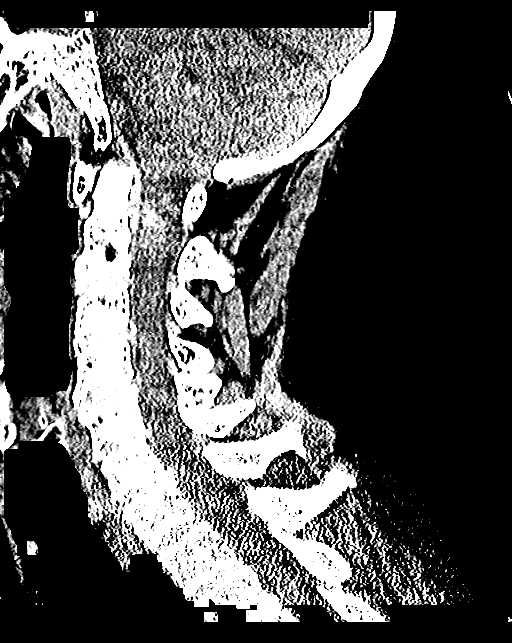
[im 41/62  brain]
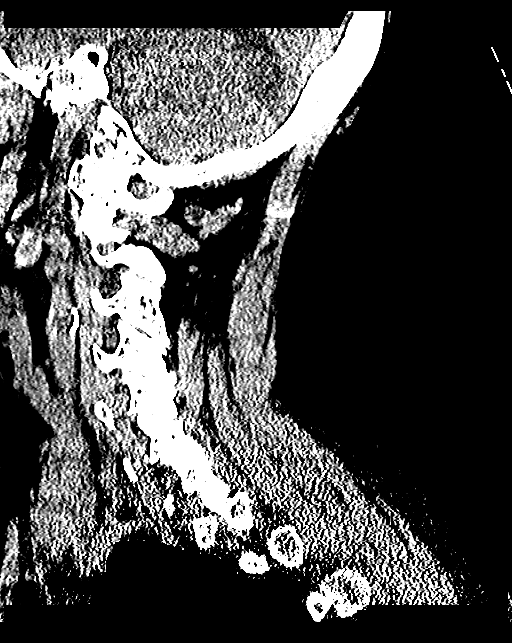

[Series 10: orthogonals · axial · 0.24mm/px · z∈[-369,-220]mm · 8 of 94 slices shown, 10 images]
[im 8/94  brain]
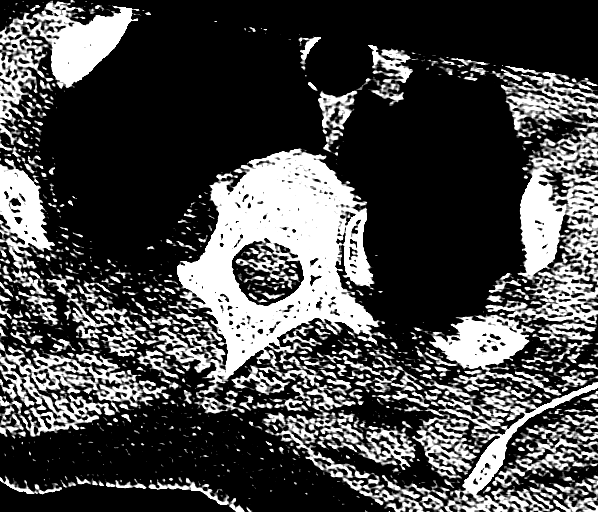
[im 8/94  bone]
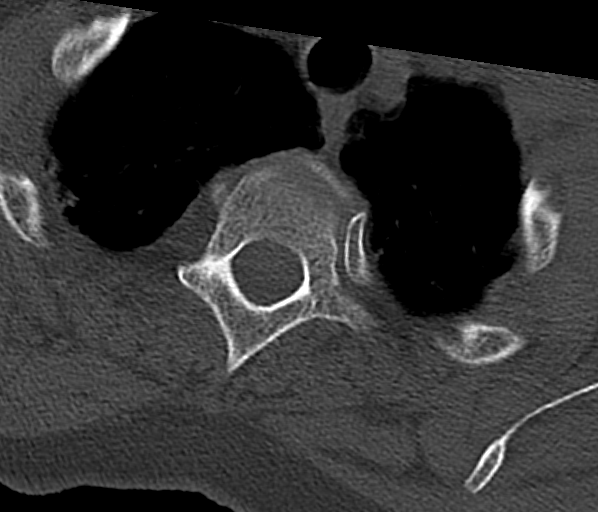
[im 24/94  brain]
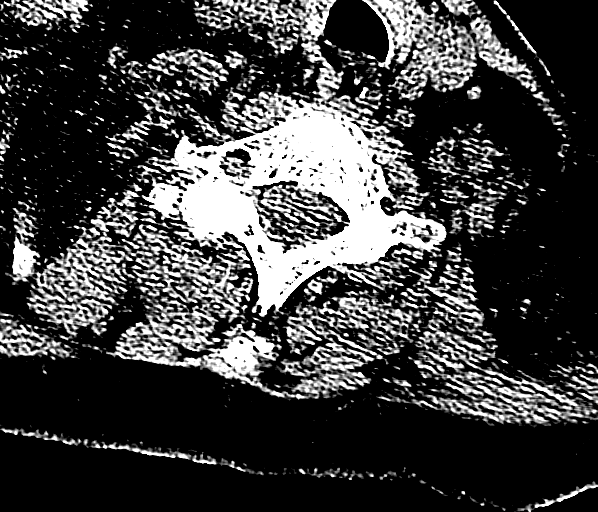
[im 32/94  brain]
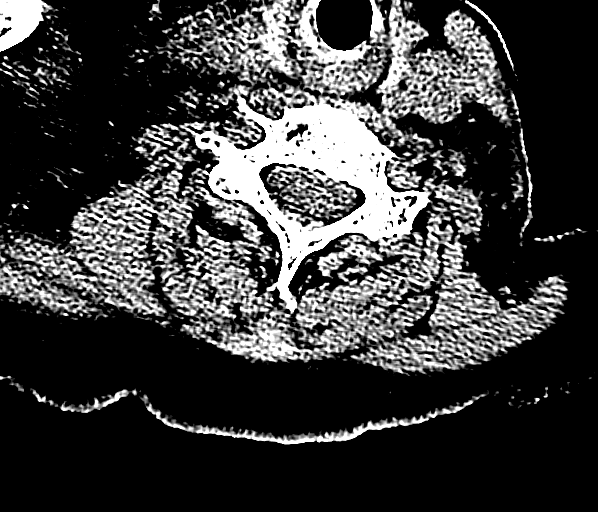
[im 39/94  brain]
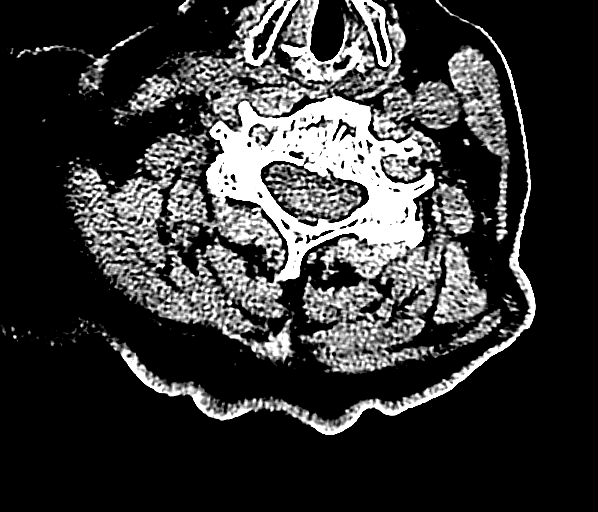
[im 55/94  brain]
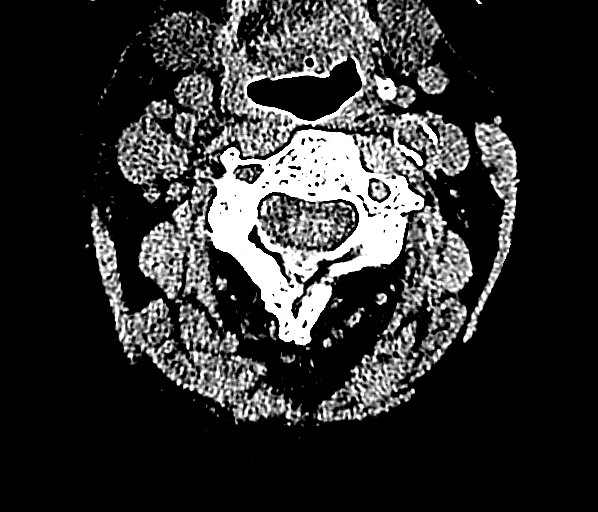
[im 55/94  bone]
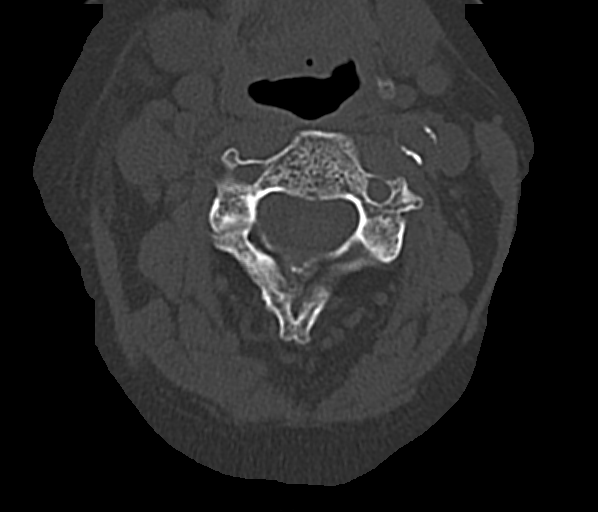
[im 63/94  brain]
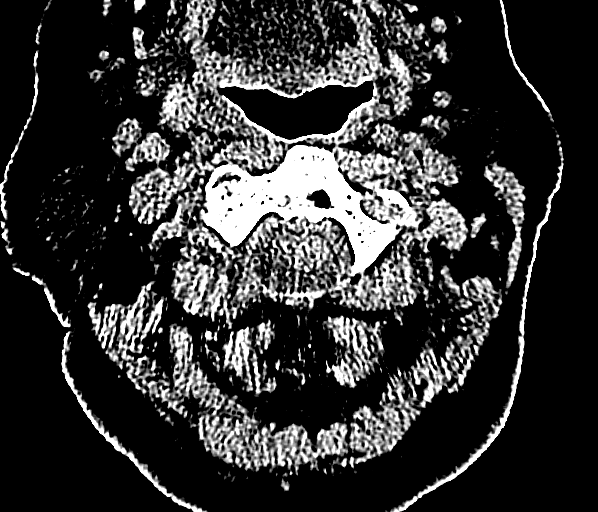
[im 70/94  brain]
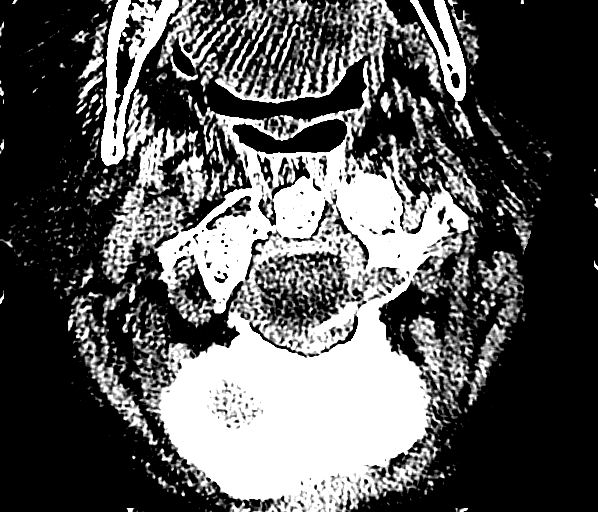
[im 86/94  brain]
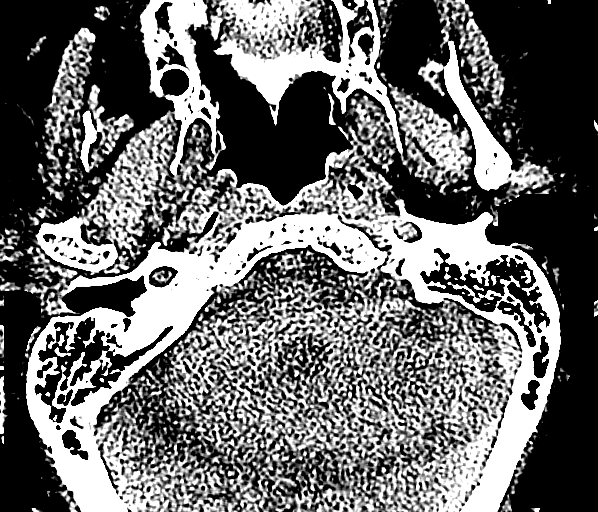

[14 of 47 positions shown; findings below may reference images not displayed]

FINDINGS: CT HEAD FINDINGS

Ventricles normal configuration. There is ventricular and sulcal
enlargement reflecting moderate generalized atrophy, stable from the
prior study. No hydrocephalus.

There are no parenchymal masses or mass effect. Patchy areas of
white matter hypoattenuation are appreciated consistent with mild to
moderate chronic microvascular ischemic change, stable. There is no
evidence of a cortical infarct.

There are no extra-axial masses or abnormal fluid collections.

There is no intracranial hemorrhage.

There is a posterior scalp hematoma. There is no underlying
fracture. Visualized sinuses and mastoid air cells are clear.

CT CERVICAL SPINE FINDINGS

No fracture. No spondylolisthesis. Mild loss of disc height at C5-C6
and C6-C7. There are small endplate osteophytes from C4 through C7.
Facet degenerative changes noted bilaterally, most prominent on the
left at C4-C5. Soft tissues show carotid vascular calcifications but
are otherwise unremarkable. There is mild scarring at the lung
apices.
IMPRESSION: HEAD CT: No acute intracranial abnormalities. No skull fracture.
Posterior scalp hematoma.

CERVICAL CT:  No fracture or acute finding.

## 2016-05-01 IMAGING — DX DG HIP (WITH OR WITHOUT PELVIS) 2-3V*L*
3 series · 3 of 3 positions shown · non-contrast
Comparison: None.

CLINICAL DATA: [facility] - Pt was
outside when staff found her on the ground. Unknown if she fell from
a standing position or from her wheelchair. Pupils equal and
reactive. EKG showed right bundle branch block and 1st degree.
Hematoma on head, no laceration or bleeding.

EXAM:
DG HIP (WITH OR WITHOUT PELVIS) 2-3V LEFT

[pelvis ap]
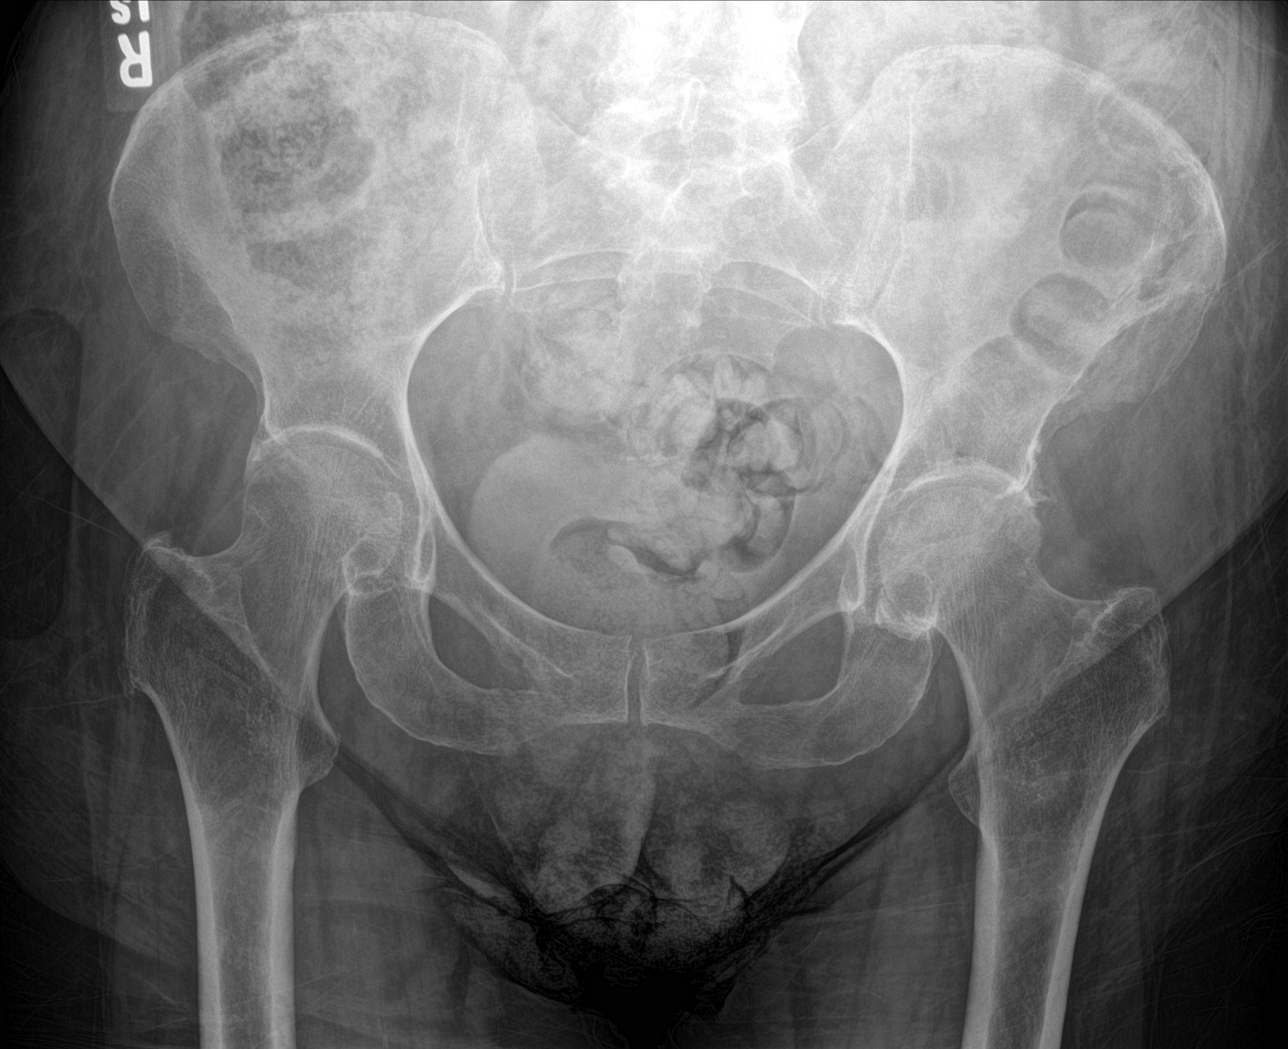

[hip ap]
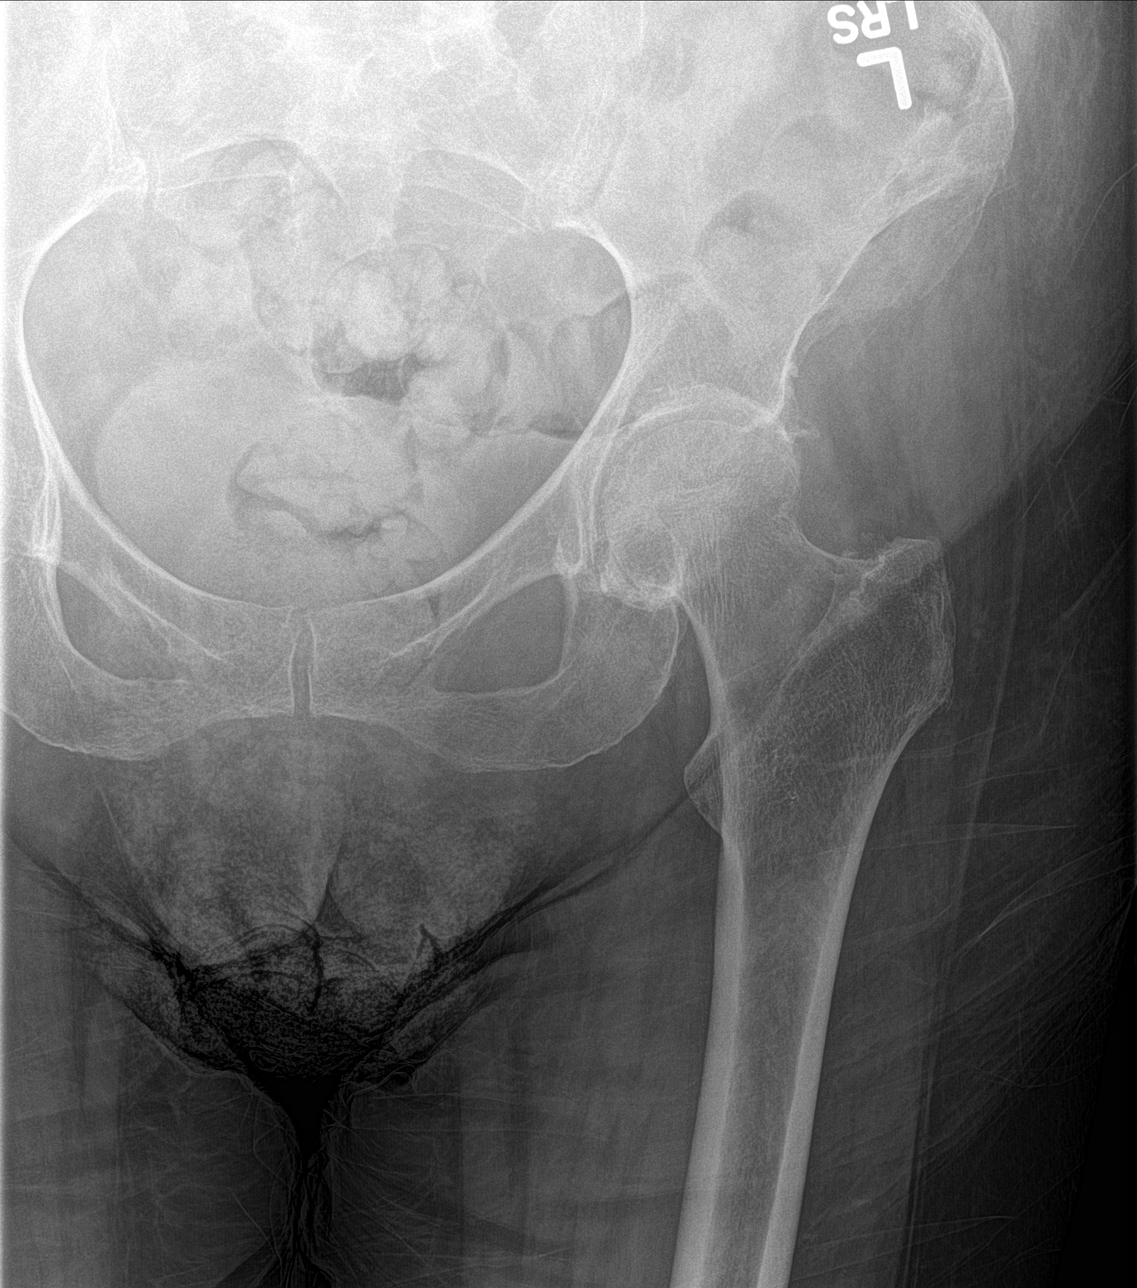

[hip lat]
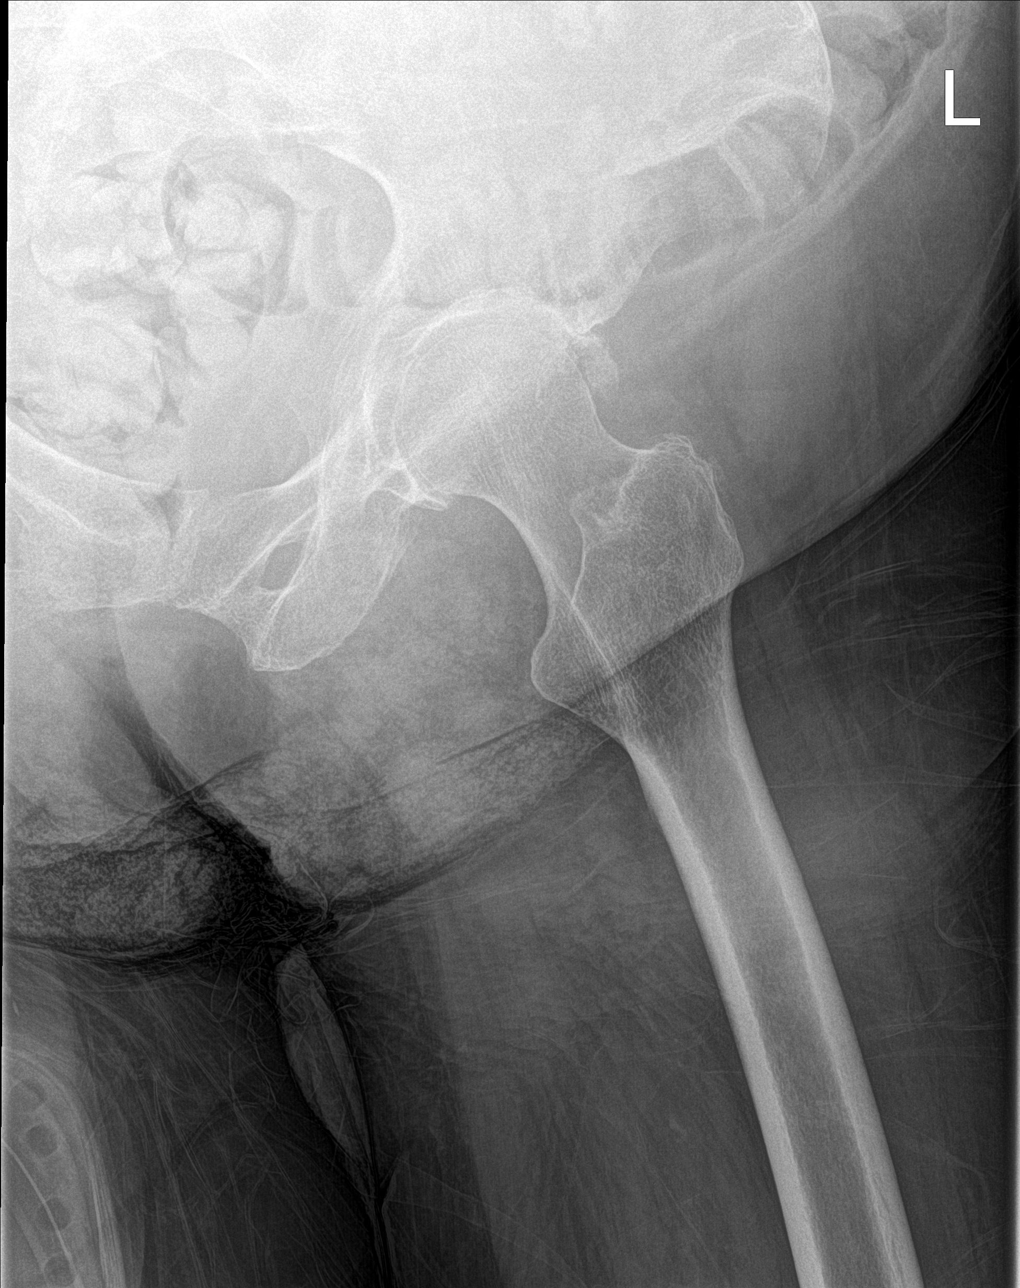

[3 of 3 positions shown; findings below may reference images not displayed]

FINDINGS: No acute fracture. Hip joint is normally aligned. There is bony
productive change along the acetabular margins bilaterally, mostly
superiorly, with mild hip joint space narrowing superior laterally.

SI joints and symphysis pubis are normally spaced and aligned.

Bones are demineralized.  Soft tissues are unremarkable.
IMPRESSION: No acute fracture or dislocation.

## 2016-05-02 DIAGNOSIS — G0430 Acute necrotizing hemorrhagic encephalopathy, unspecified: Secondary | ICD-10-CM | POA: Diagnosis not present

## 2016-05-03 DIAGNOSIS — G0430 Acute necrotizing hemorrhagic encephalopathy, unspecified: Secondary | ICD-10-CM | POA: Diagnosis not present

## 2016-05-04 DIAGNOSIS — G0430 Acute necrotizing hemorrhagic encephalopathy, unspecified: Secondary | ICD-10-CM | POA: Diagnosis not present

## 2016-05-05 DIAGNOSIS — R32 Unspecified urinary incontinence: Secondary | ICD-10-CM | POA: Diagnosis not present

## 2016-05-05 DIAGNOSIS — G0430 Acute necrotizing hemorrhagic encephalopathy, unspecified: Secondary | ICD-10-CM | POA: Diagnosis not present

## 2016-05-06 DIAGNOSIS — G0430 Acute necrotizing hemorrhagic encephalopathy, unspecified: Secondary | ICD-10-CM | POA: Diagnosis not present

## 2016-05-07 DIAGNOSIS — G0430 Acute necrotizing hemorrhagic encephalopathy, unspecified: Secondary | ICD-10-CM | POA: Diagnosis not present

## 2016-05-08 DIAGNOSIS — G0430 Acute necrotizing hemorrhagic encephalopathy, unspecified: Secondary | ICD-10-CM | POA: Diagnosis not present

## 2016-05-09 DIAGNOSIS — G0430 Acute necrotizing hemorrhagic encephalopathy, unspecified: Secondary | ICD-10-CM | POA: Diagnosis not present

## 2016-05-10 DIAGNOSIS — G0430 Acute necrotizing hemorrhagic encephalopathy, unspecified: Secondary | ICD-10-CM | POA: Diagnosis not present

## 2016-05-11 DIAGNOSIS — G0430 Acute necrotizing hemorrhagic encephalopathy, unspecified: Secondary | ICD-10-CM | POA: Diagnosis not present

## 2016-05-12 DIAGNOSIS — G0430 Acute necrotizing hemorrhagic encephalopathy, unspecified: Secondary | ICD-10-CM | POA: Diagnosis not present

## 2016-05-13 DIAGNOSIS — F411 Generalized anxiety disorder: Secondary | ICD-10-CM | POA: Diagnosis not present

## 2016-05-13 DIAGNOSIS — G0430 Acute necrotizing hemorrhagic encephalopathy, unspecified: Secondary | ICD-10-CM | POA: Diagnosis not present

## 2016-05-13 DIAGNOSIS — F0281 Dementia in other diseases classified elsewhere with behavioral disturbance: Secondary | ICD-10-CM | POA: Diagnosis not present

## 2016-05-14 DIAGNOSIS — G0430 Acute necrotizing hemorrhagic encephalopathy, unspecified: Secondary | ICD-10-CM | POA: Diagnosis not present

## 2016-05-15 DIAGNOSIS — G0430 Acute necrotizing hemorrhagic encephalopathy, unspecified: Secondary | ICD-10-CM | POA: Diagnosis not present

## 2016-05-16 DIAGNOSIS — G0430 Acute necrotizing hemorrhagic encephalopathy, unspecified: Secondary | ICD-10-CM | POA: Diagnosis not present

## 2016-05-17 ENCOUNTER — Encounter (HOSPITAL_COMMUNITY): Payer: Self-pay | Admitting: Emergency Medicine

## 2016-05-17 ENCOUNTER — Emergency Department (HOSPITAL_COMMUNITY): Payer: Commercial Managed Care - HMO

## 2016-05-17 ENCOUNTER — Emergency Department (HOSPITAL_COMMUNITY)
Admission: EM | Admit: 2016-05-17 | Discharge: 2016-05-17 | Disposition: A | Discharge status: Home / Self Care | Payer: Commercial Managed Care - HMO | Attending: Emergency Medicine | Admitting: Emergency Medicine

## 2016-05-17 DIAGNOSIS — Z87891 Personal history of nicotine dependence: Secondary | ICD-10-CM | POA: Diagnosis not present

## 2016-05-17 DIAGNOSIS — T148XXA Other injury of unspecified body region, initial encounter: Secondary | ICD-10-CM | POA: Diagnosis not present

## 2016-05-17 DIAGNOSIS — Y999 Unspecified external cause status: Secondary | ICD-10-CM | POA: Insufficient documentation

## 2016-05-17 DIAGNOSIS — N189 Chronic kidney disease, unspecified: Secondary | ICD-10-CM | POA: Insufficient documentation

## 2016-05-17 DIAGNOSIS — I129 Hypertensive chronic kidney disease with stage 1 through stage 4 chronic kidney disease, or unspecified chronic kidney disease: Secondary | ICD-10-CM | POA: Insufficient documentation

## 2016-05-17 DIAGNOSIS — G0430 Acute necrotizing hemorrhagic encephalopathy, unspecified: Secondary | ICD-10-CM | POA: Diagnosis not present

## 2016-05-17 DIAGNOSIS — Y939 Activity, unspecified: Secondary | ICD-10-CM | POA: Diagnosis not present

## 2016-05-17 DIAGNOSIS — Z96641 Presence of right artificial hip joint: Secondary | ICD-10-CM | POA: Insufficient documentation

## 2016-05-17 DIAGNOSIS — E039 Hypothyroidism, unspecified: Secondary | ICD-10-CM | POA: Diagnosis not present

## 2016-05-17 DIAGNOSIS — W19XXXA Unspecified fall, initial encounter: Secondary | ICD-10-CM | POA: Diagnosis not present

## 2016-05-17 DIAGNOSIS — S79922A Unspecified injury of left thigh, initial encounter: Secondary | ICD-10-CM | POA: Diagnosis not present

## 2016-05-17 DIAGNOSIS — Z7982 Long term (current) use of aspirin: Secondary | ICD-10-CM | POA: Diagnosis not present

## 2016-05-17 DIAGNOSIS — R4182 Altered mental status, unspecified: Secondary | ICD-10-CM | POA: Diagnosis not present

## 2016-05-17 DIAGNOSIS — M25562 Pain in left knee: Secondary | ICD-10-CM | POA: Diagnosis not present

## 2016-05-17 DIAGNOSIS — R259 Unspecified abnormal involuntary movements: Secondary | ICD-10-CM | POA: Diagnosis not present

## 2016-05-17 DIAGNOSIS — M79605 Pain in left leg: Secondary | ICD-10-CM | POA: Diagnosis not present

## 2016-05-17 DIAGNOSIS — Y929 Unspecified place or not applicable: Secondary | ICD-10-CM | POA: Insufficient documentation

## 2016-05-17 DIAGNOSIS — S3993XA Unspecified injury of pelvis, initial encounter: Secondary | ICD-10-CM | POA: Diagnosis not present

## 2016-05-17 DIAGNOSIS — M79652 Pain in left thigh: Secondary | ICD-10-CM | POA: Diagnosis not present

## 2016-05-17 DIAGNOSIS — M79606 Pain in leg, unspecified: Secondary | ICD-10-CM | POA: Diagnosis not present

## 2016-05-17 LAB — URINALYSIS, ROUTINE W REFLEX MICROSCOPIC
Bilirubin Urine: NEGATIVE
GLUCOSE, UA: NEGATIVE mg/dL
Ketones, ur: NEGATIVE mg/dL
Nitrite: NEGATIVE
PROTEIN: NEGATIVE mg/dL
SPECIFIC GRAVITY, URINE: 1.017 (ref 1.005–1.030)
Squamous Epithelial / LPF: NONE SEEN
pH: 6 (ref 5.0–8.0)

## 2016-05-17 LAB — CBC WITH DIFFERENTIAL/PLATELET
BASOS ABS: 0 10*3/uL (ref 0.0–0.1)
Basophils Relative: 0 %
EOS ABS: 0.2 10*3/uL (ref 0.0–0.7)
EOS PCT: 2 %
HCT: 33.2 % — ABNORMAL LOW (ref 36.0–46.0)
Hemoglobin: 11.2 g/dL — ABNORMAL LOW (ref 12.0–15.0)
Lymphocytes Relative: 24 %
Lymphs Abs: 1.7 10*3/uL (ref 0.7–4.0)
MCH: 29.5 pg (ref 26.0–34.0)
MCHC: 33.7 g/dL (ref 30.0–36.0)
MCV: 87.4 fL (ref 78.0–100.0)
MONO ABS: 0.5 10*3/uL (ref 0.1–1.0)
Monocytes Relative: 7 %
Neutro Abs: 4.7 10*3/uL (ref 1.7–7.7)
Neutrophils Relative %: 67 %
PLATELETS: 228 10*3/uL (ref 150–400)
RBC: 3.8 MIL/uL — AB (ref 3.87–5.11)
RDW: 12.9 % (ref 11.5–15.5)
WBC: 7.1 10*3/uL (ref 4.0–10.5)

## 2016-05-17 LAB — BASIC METABOLIC PANEL
Anion gap: 8 (ref 5–15)
BUN: 22 mg/dL — AB (ref 6–20)
CALCIUM: 8.8 mg/dL — AB (ref 8.9–10.3)
CO2: 28 mmol/L (ref 22–32)
CREATININE: 0.73 mg/dL (ref 0.44–1.00)
Chloride: 106 mmol/L (ref 101–111)
GFR calc Af Amer: >60 mL/min (ref >60)
Glucose, Bld: 96 mg/dL (ref 65–99)
Potassium: 4.3 mmol/L (ref 3.5–5.1)
SODIUM: 142 mmol/L (ref 135–145)

## 2016-05-17 NOTE — ED Notes (Signed)
Bed: WA09 Expected date:  Expected time:  Means of arrival:  Comments: 82yo Fall

## 2016-05-17 NOTE — ED Notes (Signed)
[facility] contacted for transport 

## 2016-05-17 NOTE — Discharge Instructions (Signed)
Use Tylenol as needed for pain.  See your doctor for a check up in 2-3 days.

## 2016-05-17 NOTE — ED Provider Notes (Signed)
[facility]-EMERGENCY DEPT Provider Note   CSN: [id number] Arrival date & time: 05/17/16  0300     History   Chief Complaint Chief Complaint  Patient presents with  . Fall    HPI [name] is a 82 y.o. female.  She presents for evaluation of reported fall. Unclear how she was found or what her injuries are likely to be. She presents by EMS.  Level V caveat- dementia  HPI  Past Medical History:  Diagnosis Date  . Acute bronchitis 04/10/2013  . Anemia   . Angina   . Anxiety   . Arthritis   . Arthritis 10/02/2012  . Asthma   . Chicken pox   . Chronic kidney disease   . Colon polyps   . Dehydration 08/31/2013  . Dementia   . Depression   . Dysrhythmia   . Headache(784.0)   . Heart murmur   . Hypertension   . Hypothyroidism   . Left leg pain 10/02/2012  . Pneumonia   . Recurrent upper respiratory infection (URI)   . Shortness of breath   . UTI (urinary tract infection) 12/30/2012  . Valvular heart disease 09/04/2012    Patient Active Problem List   Diagnosis Date Noted  . Pressure ulcer 09/20/2015  . Displaced fracture of right femoral neck (HCC) 09/19/2015  . Altered mental status   . Confusion 06/26/2015  . Fall   . Aspiration pneumonia (HCC) 01/10/2015  . Acute encephalopathy 01/08/2015  . UTI (urinary tract infection) 01/08/2015  . 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 Recurrent falls 11/27/2014  . FTT (failure to thrive) in adult 10/26/2014  . Dehydration 10/26/2014  . Abnormal urinalysis 10/26/2014  . Normocytic anemia 10/26/2014  . Lump of skin of left upper extremity 09/07/2014  . Rash and nonspecific skin eruption 05/08/2014  . Syncope and collapse 11/30/2013  . Low back pain, episodic 11/20/2013  . Chronic kidney disease   . Insomnia 08/31/2013  . Arthritis 10/02/2012  . Valvular heart disease 09/04/2012  . Restless leg syndrome 06/06/2012  . Trochanteric bursitis 08/02/2011  . Dementia 08/02/2011  . Leg pain 03/28/2011  .  Hypertension 09/22/2010  . Hypothyroidism 09/22/2010    Past Surgical History:  Procedure Laterality Date  . ABDOMINAL HYSTERECTOMY    . APPENDECTOMY    . BACK SURGERY    . CHOLECYSTECTOMY    . COLONOSCOPY W/ POLYPECTOMY    . EYE SURGERY     cataract removed and eye lids lifted  . feet surgery    . FRACTURE SURGERY     bilateral arms  . HAND SURGERY    . HIP ARTHROPLASTY Right 09/20/2015   Procedure: ARTHROPLASTY RIGHT  BIPOLAR ANTERIOR HIP (HEMIARTHROPLASTY);  Surgeon: Samson FredericBrian Swinteck, MD;  Location: [facility];  Service: Orthopedics;  Laterality: Right;  . kidney stones    . SHOULDER ARTHROSCOPY    . TONSILLECTOMY    . TONSILLECTOMY    . TUBAL LIGATION      OB History    No data available       Home Medications    Prior to Admission medications   Medication Sig Start Date End Date Taking? Authorizing Provider  acetaminophen (TYLENOL) 500 MG tablet Take 500 mg by mouth every 4 (four) hours as needed for mild pain, moderate pain, fever or headache.     Historical Provider, MD  alum & mag hydroxide-simeth (MINTOX) 200-200-20 MG/5ML suspension Take 30 mLs by mouth every 6 (six) hours as needed for indigestion  or heartburn.     Historical Provider, MD  aspirin 81 MG chewable tablet Chew 81 mg by mouth daily.    Historical Provider, MD  cephALEXin (KEFLEX) 500 MG capsule Take 1 capsule (500 mg total) by mouth 4 (four) times daily. 04/25/16   Jaime Pilcher Ward, PA-C  clonazePAM (KLONOPIN) 0.5 MG tablet Take 1 tablet (0.5 mg total) by mouth 2 (two) times daily. Patient taking differently: Take 0.25-0.5 mg by mouth 3 (three) times daily. Take 0.5 bid daily at 0800 and 2000. Take 0.25 daily at1400 09/23/15   Elease Etienne, MD  divalproex (DEPAKOTE SPRINKLE) 125 MG capsule Take 125 mg by mouth 3 (three) times daily.     Historical Provider, MD  escitalopram (LEXAPRO) 10 MG tablet Take 10 mg by mouth daily.    Historical Provider, MD  guaifenesin (ROBITUSSIN) 100 MG/5ML syrup Take 200  mg by mouth every 6 (six) hours as needed for cough.     Historical Provider, MD  levothyroxine (SYNTHROID, LEVOTHROID) 50 MCG tablet Take 50 mcg by mouth daily before breakfast.    Historical Provider, MD  loperamide (IMODIUM) 2 MG capsule Take 2 mg by mouth as needed for diarrhea or loose stools.     Historical Provider, MD  magnesium hydroxide (MILK OF MAGNESIA) 400 MG/5ML suspension Take 30 mLs by mouth at bedtime as needed for mild constipation.    Historical Provider, MD  Multiple Vitamins-Minerals (MULTIVITAMIN WITH MINERALS) tablet Take 1 tablet by mouth daily.    Historical Provider, MD  neomycin-bacitracin-polymyxin (NEOSPORIN) ointment Apply 1 application topically daily as needed for wound care.     Historical Provider, MD  Nutritional Supplements (NUTRITIONAL DRINK) LIQD Take 1 Bottle by mouth 3 (three) times daily with meals. Mighty Shakes    Historical Provider, MD  ondansetron (ZOFRAN-ODT) 8 MG disintegrating tablet Take 8 mg by mouth every 8 (eight) hours as needed for nausea or vomiting.    Historical Provider, MD  rivastigmine (EXELON) 3 MG capsule Take 3 mg by mouth 2 (two) times daily.    Historical Provider, MD  senna (SENOKOT) 8.6 MG TABS tablet Take 2 tablets by mouth 2 (two) times daily.    Historical Provider, MD  Skin Protectants, Misc. (BAZA PROTECT EX) Apply 1 application topically as needed (after each diaper change).    Historical Provider, MD  traMADol (ULTRAM) 50 MG tablet Take 1 tablet (50 mg total) by mouth every 6 (six) hours as needed for severe pain. 09/23/15   Elease Etienne, MD  traZODone (DESYREL) 50 MG tablet Take 50 mg by mouth at bedtime.     Historical Provider, MD    Family History Family History  Problem Relation Age of Onset  . Heart disease Mother   . Heart disease Father   . Heart disease Other   . Birth defects Other     Social History Social History  Substance Use Topics  . Smoking status: Former Smoker    Packs/day: 0.20    Years:  1.00    Types: Cigarettes    Quit date: 06/02/1941  . Smokeless tobacco: Never Used  . Alcohol use No     Allergies   Morphine and related; Codeine; and Hydrocodone   Review of Systems Review of Systems  Unable to perform ROS: Dementia     Physical Exam Updated Vital Signs BP 156/60   Pulse (!) 59   Temp 97.6 F (36.4 C) (Oral)   Resp 17   Ht 5\' 5"  (1.651 m)  Wt 177 lb (80.3 kg)   SpO2 93%   BMI 29.45 kg/m   Physical Exam  Constitutional: She appears well-developed.  Irving BurtonEmily, frail  HENT:  Head: Normocephalic and atraumatic.  No lacerations, deformity, crepitation of the scalp  Eyes: Conjunctivae and EOM are normal. Pupils are equal, round, and reactive to light.  Neck: Normal range of motion and phonation normal. Neck supple.  Cardiovascular: Normal rate and regular rhythm.   Pulmonary/Chest: Effort normal and breath sounds normal. She exhibits no tenderness.  No crepitation or deformity of the chest wall.  Abdominal: Soft. She exhibits no distension. There is no tenderness. There is no guarding.  Musculoskeletal:  Decreased range of motion right leg secondary to pain in the femur, which localizes to the distal portion. Mild swelling left distal femur region.  Neurological: She is alert. She exhibits normal muscle tone.  Skin: Skin is warm and dry.  Psychiatric: She has a normal mood and affect. Her behavior is normal.  Nursing note and vitals reviewed.    ED Treatments / Results  Labs (all labs ordered are listed, but only abnormal results are displayed) Labs Reviewed  BASIC METABOLIC PANEL - Abnormal; Notable for the following:       Result Value   BUN 22 (*)    Calcium 8.8 (*)    All other components within normal limits  CBC WITH DIFFERENTIAL/PLATELET - Abnormal; Notable for the following:    RBC 3.80 (*)    Hemoglobin 11.2 (*)    HCT 33.2 (*)    All other components within normal limits  URINALYSIS, ROUTINE W REFLEX MICROSCOPIC - Abnormal; Notable  for the following:    APPearance HAZY (*)    Hgb urine dipstick LARGE (*)    Leukocytes, UA LARGE (*)    Bacteria, UA RARE (*)    All other components within normal limits    EKG  EKG Interpretation None       Radiology Dg Pelvis 1-2 Views  Result Date: 05/17/2016 CLINICAL DATA:  Unwitnessed fall.  LEFT leg pain. EXAM: LEFT FEMUR 2 VIEWS; PELVIS - 1-2 VIEW COMPARISON:  CT abdomen and pelvis April 17, 2016 FINDINGS: Status post RIGHT hip hemiarthroplasty. Hip joint spaces are intact. Sacroiliac joints are symmetric. Mild degenerative change of the knee and LEFT hip. Osteopenia. Mild vascular calcifications. No destructive bony lesions. Included soft tissue planes are non-suspicious. IMPRESSION: No acute fracture deformity or dislocation. If there is high clinical suspicion for occult hip fracture or if the patient refuses to bear-weight, consider further evaluation with MRI. Although CT is expeditious, evidence is lacking regarding accuracy of CT over plain film radiography. Electronically Signed   By: Awilda Metroourtnay  Bloomer M.D.   On: 05/17/2016 04:25   Dg Femur Min 2 Views Left  Result Date: 05/17/2016 CLINICAL DATA:  Unwitnessed fall.  LEFT leg pain. EXAM: LEFT FEMUR 2 VIEWS; PELVIS - 1-2 VIEW COMPARISON:  CT abdomen and pelvis April 17, 2016 FINDINGS: Status post RIGHT hip hemiarthroplasty. Hip joint spaces are intact. Sacroiliac joints are symmetric. Mild degenerative change of the knee and LEFT hip. Osteopenia. Mild vascular calcifications. No destructive bony lesions. Included soft tissue planes are non-suspicious. IMPRESSION: No acute fracture deformity or dislocation. If there is high clinical suspicion for occult hip fracture or if the patient refuses to bear-weight, consider further evaluation with MRI. Although CT is expeditious, evidence is lacking regarding accuracy of CT over plain film radiography. Electronically Signed   By: Awilda Metroourtnay  Bloomer M.D.   On:  05/17/2016 04:25     Procedures Procedures (including critical care time)  Medications Ordered in ED Medications - No data to display   Initial Impression / Assessment and Plan / ED Course  I have reviewed the triage vital signs and the nursing notes.  Pertinent labs & imaging results that were available during my care of the patient were reviewed by me and considered in my medical decision making (see chart for details).  Clinical Course     Medications - No data to display  Patient Vitals for the past 24 hrs:  BP Temp Temp src Pulse Resp SpO2 Height Weight  05/17/16 0330 156/60 - - - - - - -  05/17/16 0316 - - - - - - 5\' 5"  (1.651 m) 177 lb (80.3 kg)  05/17/16 0313 159/67 97.6 F (36.4 C) Oral (!) 59 17 93 % - -  05/17/16 0311 - - - 60 - 93 % - -  05/17/16 0310 156/65 - - - - - - -  05/17/16 0302 128/74 - - 64 - 96 % - -    4:34 AM Reevaluation with update and discussion. After initial assessment and treatment, an updated evaluation reveals No change in clinical status.Mancel Bale L    Final Clinical Impressions(s) / ED Diagnoses   Final diagnoses:  Fall, initial encounter  Acute pain of left knee    Fall, without serious injuries, wound on imaging and clinical examination. Screening labs were negative.  Nursing Notes Reviewed/ Care Coordinated Applicable Imaging Reviewed Interpretation of Laboratory Data incorporated into ED treatment  The patient appears reasonably screened and/or stabilized for discharge and I doubt any other medical condition or other [facility] requiring further screening, evaluation, or treatment in the ED at this time prior to discharge.  Plan: Home Medications- APAP prn, continue usual; Home Treatments- rest; return here if the recommended treatment, does not improve the symptoms; Recommended follow up- PCP 2 days, and prn   New Prescriptions New Prescriptions   No medications on file     Mancel Bale, MD 05/17/16 [phone]

## 2016-05-17 NOTE — ED Triage Notes (Signed)
Per EMS[facility]itness[facility]. Denies neck and back pain. Alert to her normal per facility. Pt has dementia.

## 2016-05-18 DIAGNOSIS — G0430 Acute necrotizing hemorrhagic encephalopathy, unspecified: Secondary | ICD-10-CM | POA: Diagnosis not present

## 2016-05-19 DIAGNOSIS — G309 Alzheimer's disease, unspecified: Secondary | ICD-10-CM | POA: Diagnosis not present

## 2016-05-19 DIAGNOSIS — R296 Repeated falls: Secondary | ICD-10-CM | POA: Diagnosis not present

## 2016-05-19 DIAGNOSIS — G0430 Acute necrotizing hemorrhagic encephalopathy, unspecified: Secondary | ICD-10-CM | POA: Diagnosis not present

## 2016-05-19 DIAGNOSIS — F0281 Dementia in other diseases classified elsewhere with behavioral disturbance: Secondary | ICD-10-CM | POA: Diagnosis not present

## 2016-05-20 DIAGNOSIS — G0430 Acute necrotizing hemorrhagic encephalopathy, unspecified: Secondary | ICD-10-CM | POA: Diagnosis not present

## 2016-05-21 DIAGNOSIS — Z5181 Encounter for therapeutic drug level monitoring: Secondary | ICD-10-CM | POA: Diagnosis not present

## 2016-05-21 DIAGNOSIS — G0430 Acute necrotizing hemorrhagic encephalopathy, unspecified: Secondary | ICD-10-CM | POA: Diagnosis not present

## 2016-05-22 ENCOUNTER — Emergency Department (HOSPITAL_COMMUNITY)
Admission: EM | Admit: 2016-05-22 | Discharge: 2016-05-22 | Disposition: A | Discharge status: Home / Self Care | Payer: Commercial Managed Care - HMO | Attending: Emergency Medicine | Admitting: Emergency Medicine

## 2016-05-22 ENCOUNTER — Emergency Department (HOSPITAL_COMMUNITY): Payer: Commercial Managed Care - HMO

## 2016-05-22 ENCOUNTER — Encounter (HOSPITAL_COMMUNITY): Payer: Self-pay | Admitting: Emergency Medicine

## 2016-05-22 DIAGNOSIS — G0430 Acute necrotizing hemorrhagic encephalopathy, unspecified: Secondary | ICD-10-CM | POA: Diagnosis not present

## 2016-05-22 DIAGNOSIS — Z96641 Presence of right artificial hip joint: Secondary | ICD-10-CM | POA: Insufficient documentation

## 2016-05-22 DIAGNOSIS — M25559 Pain in unspecified hip: Secondary | ICD-10-CM | POA: Diagnosis not present

## 2016-05-22 DIAGNOSIS — F0390 Unspecified dementia without behavioral disturbance: Secondary | ICD-10-CM | POA: Insufficient documentation

## 2016-05-22 DIAGNOSIS — Y939 Activity, unspecified: Secondary | ICD-10-CM | POA: Insufficient documentation

## 2016-05-22 DIAGNOSIS — I129 Hypertensive chronic kidney disease with stage 1 through stage 4 chronic kidney disease, or unspecified chronic kidney disease: Secondary | ICD-10-CM | POA: Insufficient documentation

## 2016-05-22 DIAGNOSIS — W06XXXA Fall from bed, initial encounter: Secondary | ICD-10-CM | POA: Diagnosis not present

## 2016-05-22 DIAGNOSIS — W19XXXA Unspecified fall, initial encounter: Secondary | ICD-10-CM

## 2016-05-22 DIAGNOSIS — J45909 Unspecified asthma, uncomplicated: Secondary | ICD-10-CM | POA: Insufficient documentation

## 2016-05-22 DIAGNOSIS — Z87891 Personal history of nicotine dependence: Secondary | ICD-10-CM | POA: Diagnosis not present

## 2016-05-22 DIAGNOSIS — Y92129 Unspecified place in nursing home as the place of occurrence of the external cause: Secondary | ICD-10-CM | POA: Diagnosis not present

## 2016-05-22 DIAGNOSIS — S3993XA Unspecified injury of pelvis, initial encounter: Secondary | ICD-10-CM | POA: Diagnosis not present

## 2016-05-22 DIAGNOSIS — Z7982 Long term (current) use of aspirin: Secondary | ICD-10-CM | POA: Diagnosis not present

## 2016-05-22 DIAGNOSIS — S199XXA Unspecified injury of neck, initial encounter: Secondary | ICD-10-CM | POA: Diagnosis not present

## 2016-05-22 DIAGNOSIS — N189 Chronic kidney disease, unspecified: Secondary | ICD-10-CM | POA: Diagnosis not present

## 2016-05-22 DIAGNOSIS — R296 Repeated falls: Secondary | ICD-10-CM | POA: Diagnosis not present

## 2016-05-22 DIAGNOSIS — R259 Unspecified abnormal involuntary movements: Secondary | ICD-10-CM | POA: Diagnosis not present

## 2016-05-22 DIAGNOSIS — E039 Hypothyroidism, unspecified: Secondary | ICD-10-CM | POA: Diagnosis not present

## 2016-05-22 DIAGNOSIS — M549 Dorsalgia, unspecified: Secondary | ICD-10-CM | POA: Diagnosis not present

## 2016-05-22 DIAGNOSIS — Y999 Unspecified external cause status: Secondary | ICD-10-CM | POA: Diagnosis not present

## 2016-05-22 DIAGNOSIS — S0990XA Unspecified injury of head, initial encounter: Secondary | ICD-10-CM | POA: Diagnosis not present

## 2016-05-22 NOTE — ED Notes (Signed)
Report called back to facility, message left.

## 2016-05-22 NOTE — ED Provider Notes (Signed)
[facility]-EMERGENCY DEPT Provider Note   CSN: [id number] Arrival date & time: 05/22/16  0532     History   Chief Complaint Chief Complaint  Patient presents with  . Fall    HPI [name] is a 82 y.o. female.  She had an unwitnessed fall at the nursing home where she resides. She states that she hurts in multiple places. She does not remember falling.   The history is provided by the patient and the nursing home. The history is limited by the condition of the patient (Dementia).  Fall     Past Medical History:  Diagnosis Date  . Acute bronchitis 04/10/2013  . Anemia   . Angina   . Anxiety   . Arthritis   . Arthritis 10/02/2012  . Asthma   . Chicken pox   . Chronic kidney disease   . Colon polyps   . Dehydration 08/31/2013  . Dementia   . Depression   . Dysrhythmia   . Headache(784.0)   . Heart murmur   . Hypertension   . Hypothyroidism   . Left leg pain 10/02/2012  . Pneumonia   . Recurrent upper respiratory infection (URI)   . Shortness of breath   . UTI (urinary tract infection) 12/30/2012  . Valvular heart disease 09/04/2012    Patient Active Problem List   Diagnosis Date Noted  . Pressure ulcer 09/20/2015  . Displaced fracture of right femoral neck (HCC) 09/19/2015  . Altered mental status   . Confusion 06/26/2015  . Fall   . Aspiration pneumonia (HCC) 01/10/2015  . Acute encephalopathy 01/08/2015  . UTI (urinary tract infection) 01/08/2015  . 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 Recurrent falls 11/27/2014  . FTT (failure to thrive) in adult 10/26/2014  . Dehydration 10/26/2014  . Abnormal urinalysis 10/26/2014  . Normocytic anemia 10/26/2014  . Lump of skin of left upper extremity 09/07/2014  . Rash and nonspecific skin eruption 05/08/2014  . Syncope and collapse 11/30/2013  . Low back pain, episodic 11/20/2013  . Chronic kidney disease   . Insomnia 08/31/2013  . Arthritis 10/02/2012  . Valvular heart disease 09/04/2012  .  Restless leg syndrome 06/06/2012  . Trochanteric bursitis 08/02/2011  . Dementia 08/02/2011  . Leg pain 03/28/2011  . Hypertension 09/22/2010  . Hypothyroidism 09/22/2010    Past Surgical History:  Procedure Laterality Date  . ABDOMINAL HYSTERECTOMY    . APPENDECTOMY    . BACK SURGERY    . CHOLECYSTECTOMY    . COLONOSCOPY W/ POLYPECTOMY    . EYE SURGERY     cataract removed and eye lids lifted  . feet surgery    . FRACTURE SURGERY     bilateral arms  . HAND SURGERY    . HIP ARTHROPLASTY Right 09/20/2015   Procedure: ARTHROPLASTY RIGHT  BIPOLAR ANTERIOR HIP (HEMIARTHROPLASTY);  Surgeon: Samson FredericBrian Swinteck, MD;  Location: [facility];  Service: Orthopedics;  Laterality: Right;  . kidney stones    . SHOULDER ARTHROSCOPY    . TONSILLECTOMY    . TONSILLECTOMY    . TUBAL LIGATION      OB History    No data available       Home Medications    Prior to Admission medications   Medication Sig Start Date End Date Taking? Authorizing Provider  acetaminophen (TYLENOL) 500 MG tablet Take 500 mg by mouth every 4 (four) hours as needed for mild pain, moderate pain, fever or headache.     Historical Provider,  MD  alum & mag hydroxide-simeth (MINTOX) 200-200-20 MG/5ML suspension Take 30 mLs by mouth every 6 (six) hours as needed for indigestion or heartburn.     Historical Provider, MD  aspirin 81 MG chewable tablet Chew 81 mg by mouth daily.    Historical Provider, MD  cephALEXin (KEFLEX) 500 MG capsule Take 1 capsule (500 mg total) by mouth 4 (four) times daily. 04/25/16   Jaime Pilcher Ward, PA-C  clonazePAM (KLONOPIN) 0.5 MG tablet Take 1 tablet (0.5 mg total) by mouth 2 (two) times daily. Patient taking differently: Take 0.25-0.5 mg by mouth 3 (three) times daily. Take 0.5 bid daily at 0800 and 2000. Take 0.25 daily at1400 09/23/15   Elease Etienne, MD  divalproex (DEPAKOTE SPRINKLE) 125 MG capsule Take 125 mg by mouth 3 (three) times daily.     Historical Provider, MD  escitalopram  (LEXAPRO) 10 MG tablet Take 10 mg by mouth daily.    Historical Provider, MD  guaifenesin (ROBITUSSIN) 100 MG/5ML syrup Take 200 mg by mouth every 6 (six) hours as needed for cough.     Historical Provider, MD  levothyroxine (SYNTHROID, LEVOTHROID) 50 MCG tablet Take 50 mcg by mouth daily before breakfast.    Historical Provider, MD  loperamide (IMODIUM) 2 MG capsule Take 2 mg by mouth as needed for diarrhea or loose stools.     Historical Provider, MD  magnesium hydroxide (MILK OF MAGNESIA) 400 MG/5ML suspension Take 30 mLs by mouth at bedtime as needed for mild constipation.    Historical Provider, MD  Multiple Vitamins-Minerals (MULTIVITAMIN WITH MINERALS) tablet Take 1 tablet by mouth daily.    Historical Provider, MD  neomycin-bacitracin-polymyxin (NEOSPORIN) ointment Apply 1 application topically daily as needed for wound care.     Historical Provider, MD  Nutritional Supplements (NUTRITIONAL DRINK) LIQD Take 1 Bottle by mouth 3 (three) times daily with meals. Mighty Shakes    Historical Provider, MD  ondansetron (ZOFRAN-ODT) 8 MG disintegrating tablet Take 8 mg by mouth every 8 (eight) hours as needed for nausea or vomiting.    Historical Provider, MD  rivastigmine (EXELON) 3 MG capsule Take 3 mg by mouth 2 (two) times daily.    Historical Provider, MD  senna (SENOKOT) 8.6 MG TABS tablet Take 2 tablets by mouth 2 (two) times daily.    Historical Provider, MD  Skin Protectants, Misc. (BAZA PROTECT EX) Apply 1 application topically as needed (after each diaper change).    Historical Provider, MD  traMADol (ULTRAM) 50 MG tablet Take 1 tablet (50 mg total) by mouth every 6 (six) hours as needed for severe pain. 09/23/15   Elease Etienne, MD  traZODone (DESYREL) 50 MG tablet Take 50 mg by mouth at bedtime.     Historical Provider, MD    Family History Family History  Problem Relation Age of Onset  . Heart disease Mother   . Heart disease Father   . Heart disease Other   . Birth defects Other      Social History Social History  Substance Use Topics  . Smoking status: Former Smoker    Packs/day: 0.20    Years: 1.00    Types: Cigarettes    Quit date: 06/02/1941  . Smokeless tobacco: Never Used  . Alcohol use No     Allergies   Morphine and related; Codeine; and Hydrocodone   Review of Systems Review of Systems  Unable to perform ROS: Dementia     Physical Exam Updated Vital Signs BP 160/58 (BP  Location: Right Arm)   Pulse 61   Temp 97.5 F (36.4 C) (Oral)   Resp 18   SpO2 99% Comment: RA  Physical Exam  Nursing note and vitals reviewed.  82 year old female, resting comfortably and in no acute distress. Vital signs are significant for hypertension. Oxygen saturation is 99%, which is normal. Head is normocephalic and atraumatic. PERRLA, EOMI. Oropharynx is clear. Neck is nontender without adenopathy or JVD. Back is nontender and there is no CVA tenderness. Lungs are clear without rales, wheezes, or rhonchi. Chest is nontender. Heart has regular rate and rhythm without murmur. Abdomen is soft, flat, nontender without masses or hepatosplenomegaly and peristalsis is normoactive. Extremities have no cyanosis or edema, full range of motion is present. There is mild tenderness to palpation over the lateral aspects of the pelvis and hips, but no pain elicited with passive range of motion. Skin is warm and dry without rash. Neurologic: She is oriented to person but not place or time, cranial nerves are intact, there are no motor or sensory deficits.  ED Treatments / Results   Radiology Dg Pelvis 1-2 Views  Result Date: 05/22/2016 CLINICAL DATA:  Initial evaluation for acute trauma, fall. EXAM: PELVIS - 1-2 VIEW COMPARISON:  Prior radiograph from 05/17/2016. FINDINGS: Right hip arthroplasty in place. Severe degenerative osteoarthritic changes present at the left hip. No acute fracture identified. Degenerative changes noted within the lower lumbar spine. No acute  soft tissue abnormality. IMPRESSION: 1. No acute fracture or dislocation within the pelvis. 2. Right hip arthroplasty in place without complication. 3. 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 Electronically Signed   By: Rise Mu M.D.   On: 05/22/2016 06:46   Ct Head Wo Contrast  Result Date: 05/22/2016 CLINICAL DATA:  Initial evaluation for acute trauma, fall. EXAM: CT HEAD WITHOUT CONTRAST CT CERVICAL SPINE WITHOUT CONTRAST TECHNIQUE: Multidetector CT imaging of the head and cervical spine was performed following the standard protocol without intravenous contrast. Multiplanar CT image reconstructions of the cervical spine were also generated. COMPARISON:  Prior CT from 04/25/2016. FINDINGS: CT HEAD FINDINGS Brain: Generalized cerebral atrophy with chronic microvascular ischemic disease. No acute intracranial hemorrhage. No evidence for acute large vessel territory infarct. No mass lesion, midline shift, or mass effect. No hydrocephalus. No extra-axial fluid collection. Vascular: No hyperdense vessel.  Intracranial atherosclerosis noted. Skull: Scalp soft tissues demonstrate no acute abnormality. Calvarium intact. Sinuses/Orbits: Globes and orbital soft tissues within normal limits. Paranasal sinuses and mastoids are clear. CT CERVICAL SPINE FINDINGS Alignment: Vertebral bodies normally aligned with preservation of the normal cervical lordosis. No listhesis. Skull base and vertebrae: Skullbase intact. Normal C1-2 articulations are preserved. Dens is intact. Vertebral body heights maintained. No acute fracture. Soft tissues and spinal canal: Visualized soft tissues of the neck demonstrate no acute abnormality. Scattered calcification adjacent to the C7 spinous process is stable. No prevertebral edema. Vascular calcifications noted about the carotid bifurcations. Disc levels: Mild multilevel degenerative changes, most prevalent at C5-6 and C6-7, stable. Degenerative disc bulge noted at  C3-4. Upper chest: Visualized upper chest within normal limits. No apical pneumothorax. IMPRESSION: 1. No acute intracranial process identified. 2. Generalized cerebral atrophy with chronic microvascular ischemic disease, stable. 3. No acute traumatic injury within the cervical spine. Electronically Signed   By: Rise Mu M.D.   On: 05/22/2016 06:58   Ct Cervical Spine Wo Contrast  Result Date: 05/22/2016 CLINICAL DATA:  Initial evaluation for acute trauma, fall. EXAM: CT HEAD WITHOUT CONTRAST CT CERVICAL  SPINE WITHOUT CONTRAST TECHNIQUE: Multidetector CT imaging of the head and cervical spine was performed following the standard protocol without intravenous contrast. Multiplanar CT image reconstructions of the cervical spine were also generated. COMPARISON:  Prior CT from 04/25/2016. FINDINGS: CT HEAD FINDINGS Brain: Generalized cerebral atrophy with chronic microvascular ischemic disease. No acute intracranial hemorrhage. No evidence for acute large vessel territory infarct. No mass lesion, midline shift, or mass effect. No hydrocephalus. No extra-axial fluid collection. Vascular: No hyperdense vessel.  Intracranial atherosclerosis noted. Skull: Scalp soft tissues demonstrate no acute abnormality. Calvarium intact. Sinuses/Orbits: Globes and orbital soft tissues within normal limits. Paranasal sinuses and mastoids are clear. CT CERVICAL SPINE FINDINGS Alignment: Vertebral bodies normally aligned with preservation of the normal cervical lordosis. No listhesis. Skull base and vertebrae: Skullbase intact. Normal C1-2 articulations are preserved. Dens is intact. Vertebral body heights maintained. No acute fracture. Soft tissues and spinal canal: Visualized soft tissues of the neck demonstrate no acute abnormality. Scattered calcification adjacent to the C7 spinous process is stable. No prevertebral edema. Vascular calcifications noted about the carotid bifurcations. Disc levels: Mild multilevel  degenerative changes, most prevalent at C5-6 and C6-7, stable. Degenerative disc bulge noted at C3-4. Upper chest: Visualized upper chest within normal limits. No apical pneumothorax. IMPRESSION: 1. No acute intracranial process identified. 2. Generalized cerebral atrophy with chronic microvascular ischemic disease, stable. 3. No acute traumatic injury within the cervical spine. Electronically Signed   By: Rise MuBenjamin  McClintock M.D.   On: 05/22/2016 06:58    Procedures Procedures (including critical care time)  Medications Ordered in ED Medications - No data to display   Initial Impression / Assessment and Plan / ED Course  I have reviewed the triage vital signs and the nursing notes.  Pertinent labs & imaging results that were available during my care of the patient were reviewed by me and considered in my medical decision making (see chart for details).  Clinical Course    Fall without apparent injury. Because of tenderness across the pelvis, will check x-ray. Also, will send for CT of head and cervical spine. Old records are reviewed showing several ED visits for falls.  CT scans show no acute injury, pelvis x-ray shows no acute injury. She is discharged to return back to her nursing care facility.  Final Clinical Impressions(s) / ED Diagnoses   Final diagnoses:  Fall at nursing home, initial encounter    New Prescriptions New Prescriptions   No medications on file     Dione Boozeavid Micheale Schlack, MD 05/22/16 [phone]

## 2016-05-22 NOTE — ED Triage Notes (Signed)
[facility] EMS reports patient fell of edge of bed onto floor mat. No visible injuries. Pt has HX of dementia, pain location rotates from lt knee, back and face each time asked. EMS reports hx of HTN and chronic knee pain.

## 2016-05-22 NOTE — ED Notes (Signed)
Pt's chart accessed d/t Pt's son(POA) trying to find Pt.  This Consulting civil engineerCharge RN confirmed that the Pt was transported by to facility by [facility].  No medical information was provided. (12/21 1000)

## 2016-05-22 NOTE — ED Notes (Signed)
[facility] called  

## 2016-05-22 NOTE — ED Notes (Signed)
Bed: WA25 Expected date:  Expected time:  Means of arrival:  Comments: EMS fall 

## 2016-05-23 DIAGNOSIS — G0430 Acute necrotizing hemorrhagic encephalopathy, unspecified: Secondary | ICD-10-CM | POA: Diagnosis not present

## 2016-05-23 IMAGING — CR DG ELBOW COMPLETE 3+V*R*
5 series · 5 of 5 positions shown · non-contrast
Comparison: None.

CLINICAL DATA: Right elbow pain.  Good range of motion.

EXAM:
RIGHT ELBOW - COMPLETE 3+ VIEW

[x elbow ap right]
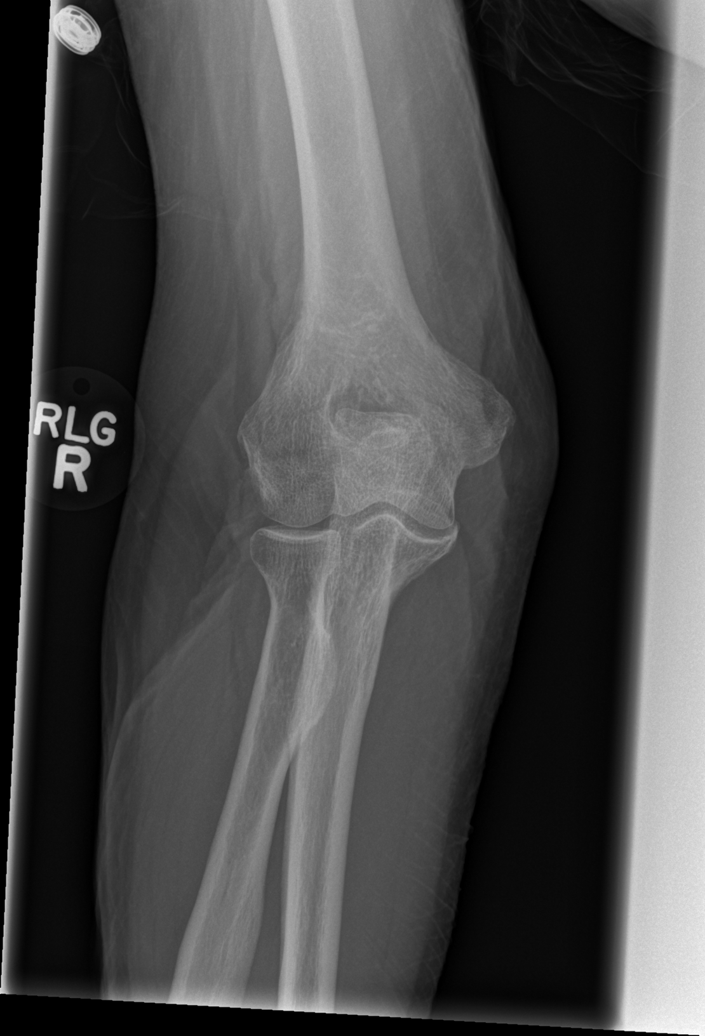

[x elbow obl right (1 of 2)]
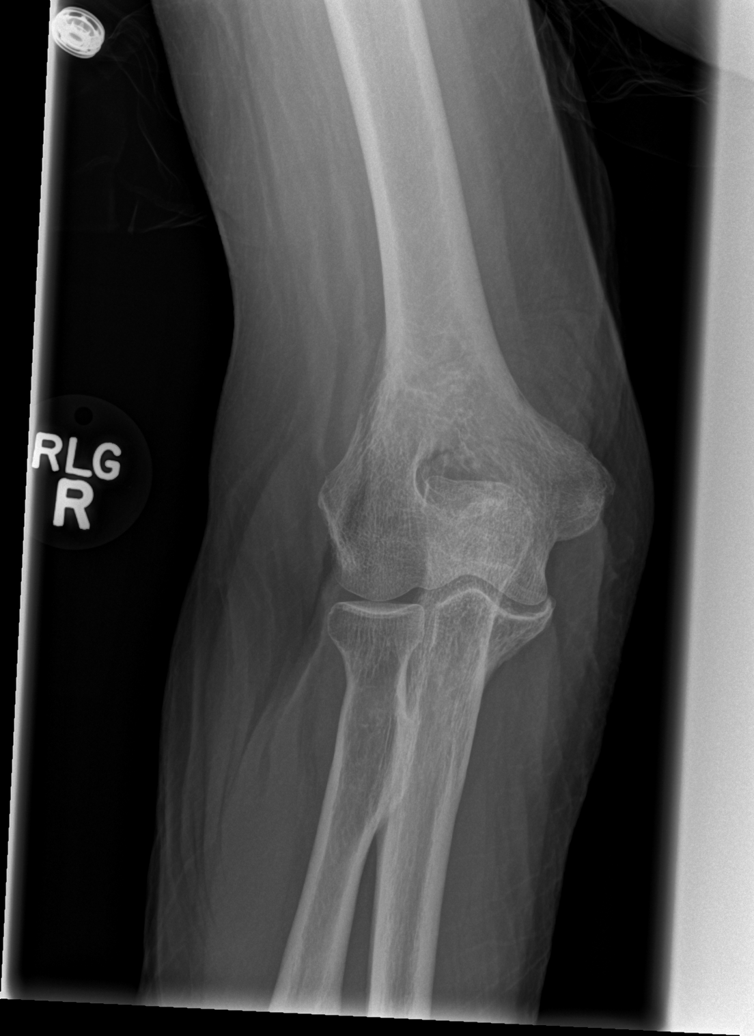

[x elbow obl right (2 of 2)]
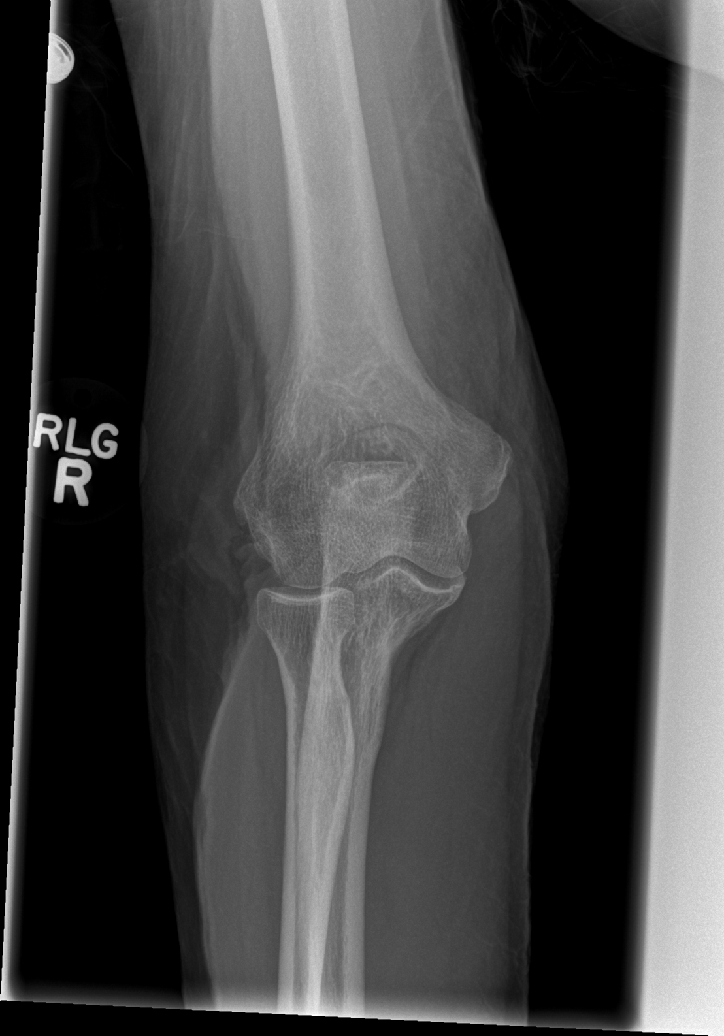

[x elbow lat right (1 of 2)]
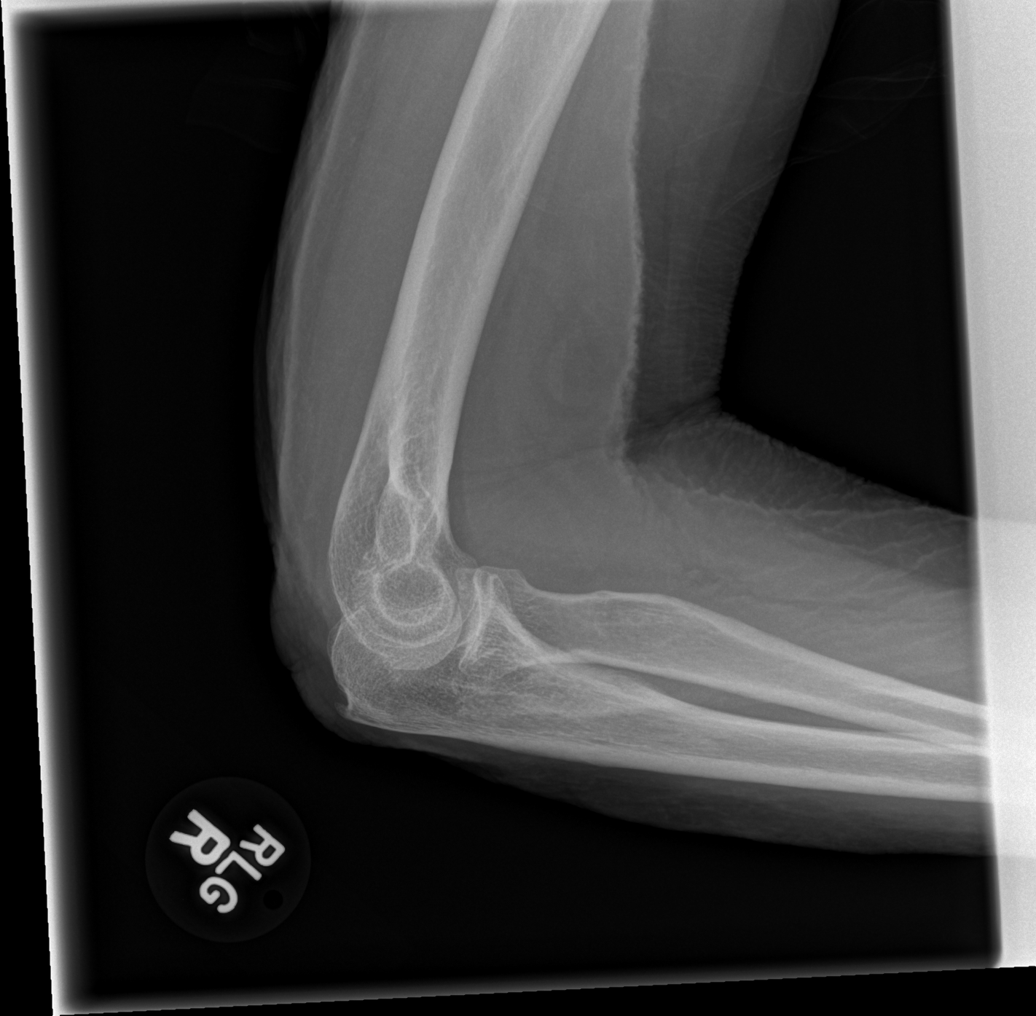

[x elbow lat right (2 of 2)]
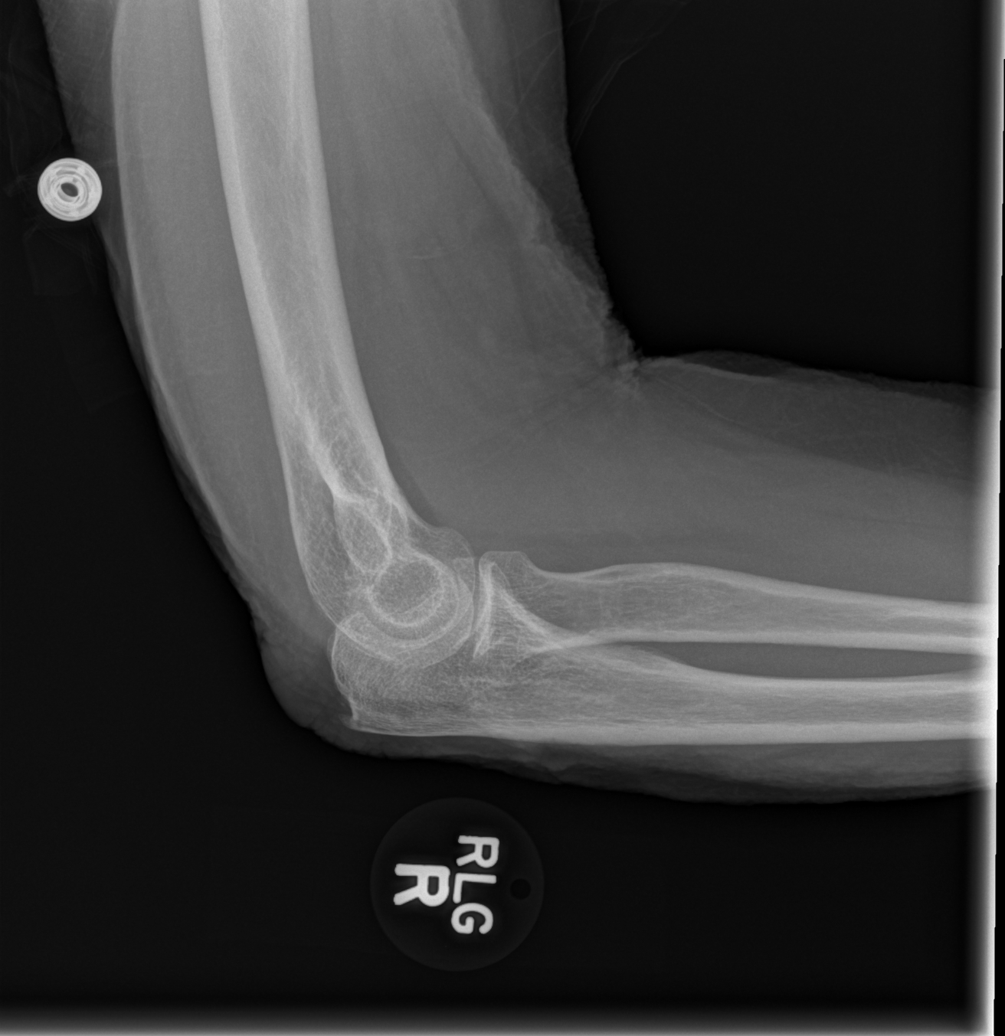

[5 of 5 positions shown; findings below may reference images not displayed]

FINDINGS: There is no evidence of fracture, dislocation, or joint effusion.
There is no evidence of arthropathy or other focal bone abnormality.
Soft tissues are unremarkable.
IMPRESSION: No acute osseous injury of the right elbow.

## 2016-05-24 DIAGNOSIS — G0430 Acute necrotizing hemorrhagic encephalopathy, unspecified: Secondary | ICD-10-CM | POA: Diagnosis not present

## 2016-05-25 DIAGNOSIS — G0430 Acute necrotizing hemorrhagic encephalopathy, unspecified: Secondary | ICD-10-CM | POA: Diagnosis not present

## 2016-05-26 DIAGNOSIS — G0430 Acute necrotizing hemorrhagic encephalopathy, unspecified: Secondary | ICD-10-CM | POA: Diagnosis not present

## 2016-05-27 DIAGNOSIS — R296 Repeated falls: Secondary | ICD-10-CM | POA: Diagnosis not present

## 2016-05-27 DIAGNOSIS — G0430 Acute necrotizing hemorrhagic encephalopathy, unspecified: Secondary | ICD-10-CM | POA: Diagnosis not present

## 2016-05-27 DIAGNOSIS — F0281 Dementia in other diseases classified elsewhere with behavioral disturbance: Secondary | ICD-10-CM | POA: Diagnosis not present

## 2016-05-27 DIAGNOSIS — G309 Alzheimer's disease, unspecified: Secondary | ICD-10-CM | POA: Diagnosis not present

## 2016-05-28 DIAGNOSIS — G0430 Acute necrotizing hemorrhagic encephalopathy, unspecified: Secondary | ICD-10-CM | POA: Diagnosis not present

## 2016-05-29 DIAGNOSIS — G0430 Acute necrotizing hemorrhagic encephalopathy, unspecified: Secondary | ICD-10-CM | POA: Diagnosis not present

## 2016-05-30 DIAGNOSIS — G0430 Acute necrotizing hemorrhagic encephalopathy, unspecified: Secondary | ICD-10-CM | POA: Diagnosis not present

## 2016-05-31 DIAGNOSIS — G0430 Acute necrotizing hemorrhagic encephalopathy, unspecified: Secondary | ICD-10-CM | POA: Diagnosis not present

## 2016-06-01 DIAGNOSIS — G0430 Acute necrotizing hemorrhagic encephalopathy, unspecified: Secondary | ICD-10-CM | POA: Diagnosis not present

## 2016-06-02 DIAGNOSIS — G0430 Acute necrotizing hemorrhagic encephalopathy, unspecified: Secondary | ICD-10-CM | POA: Diagnosis not present

## 2016-06-03 DIAGNOSIS — G0430 Acute necrotizing hemorrhagic encephalopathy, unspecified: Secondary | ICD-10-CM | POA: Diagnosis not present

## 2016-06-04 DIAGNOSIS — G0430 Acute necrotizing hemorrhagic encephalopathy, unspecified: Secondary | ICD-10-CM | POA: Diagnosis not present

## 2016-06-04 DIAGNOSIS — R32 Unspecified urinary incontinence: Secondary | ICD-10-CM | POA: Diagnosis not present

## 2016-06-05 DIAGNOSIS — G0430 Acute necrotizing hemorrhagic encephalopathy, unspecified: Secondary | ICD-10-CM | POA: Diagnosis not present

## 2016-06-06 DIAGNOSIS — G0430 Acute necrotizing hemorrhagic encephalopathy, unspecified: Secondary | ICD-10-CM | POA: Diagnosis not present

## 2016-06-07 DIAGNOSIS — G0430 Acute necrotizing hemorrhagic encephalopathy, unspecified: Secondary | ICD-10-CM | POA: Diagnosis not present

## 2016-06-08 DIAGNOSIS — G0430 Acute necrotizing hemorrhagic encephalopathy, unspecified: Secondary | ICD-10-CM | POA: Diagnosis not present

## 2016-06-08 IMAGING — CT CT HEAD W/O CM
3 of 6 series · 14 of 47 positions shown, 16 images · non-contrast
Comparison: CT of the head and cervical spine performed 02/18/2015

CLINICAL DATA: Status post unwitnessed fall. Patient hit head.
Concern for head or cervical spine injury. Initial encounter.

EXAM:
CT HEAD WITHOUT CONTRAST
CT CERVICAL SPINE WITHOUT CONTRAST
TECHNIQUE: Multidetector CT imaging of the head and cervical spine was
performed following the standard protocol without intravenous
contrast. Multiplanar CT image reconstructions of the cervical spine
were also generated.

[Series 8: axial recon · axial · 0.23mm/px · z∈[-296,-181]mm · 8 of 83 slices shown, 10 images]
[im 10/83  brain]
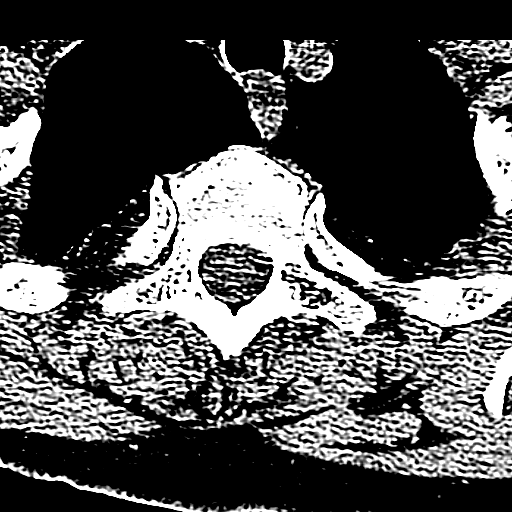
[im 10/83  bone]
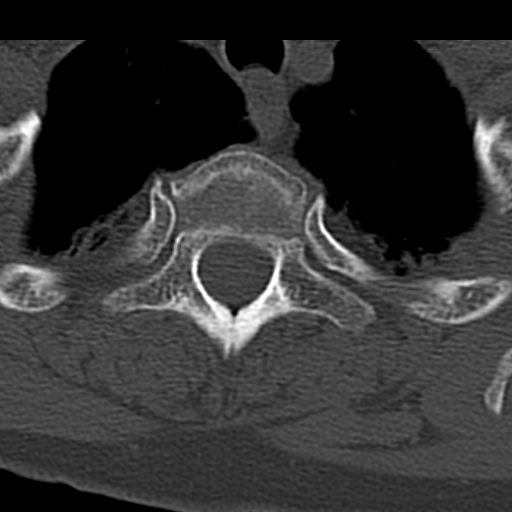
[im 19/83  brain]
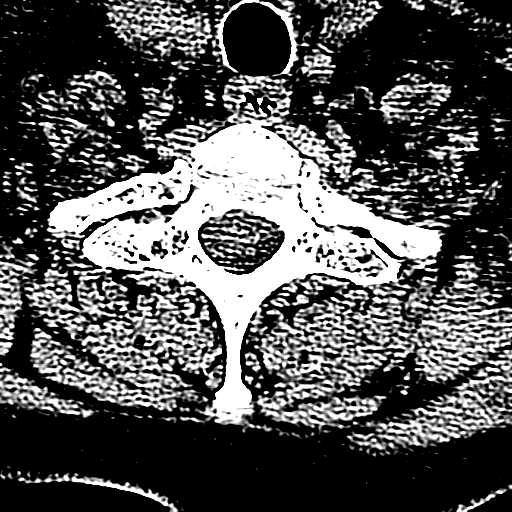
[im 28/83  brain]
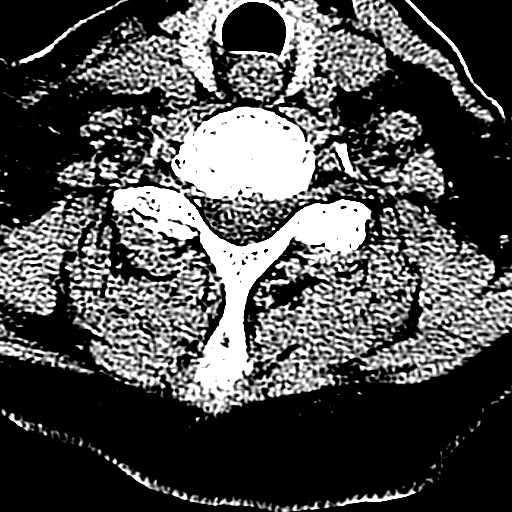
[im 37/83  brain]
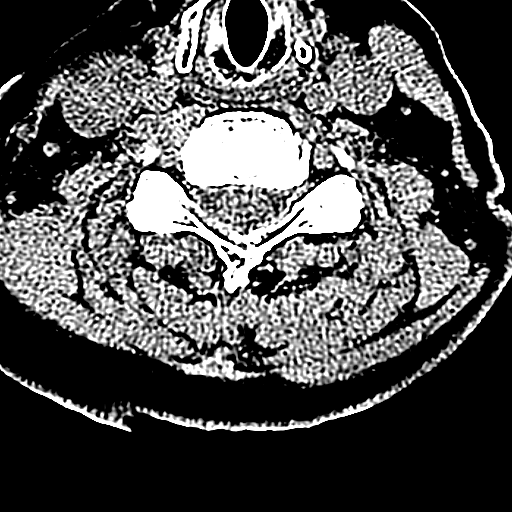
[im 46/83  brain]
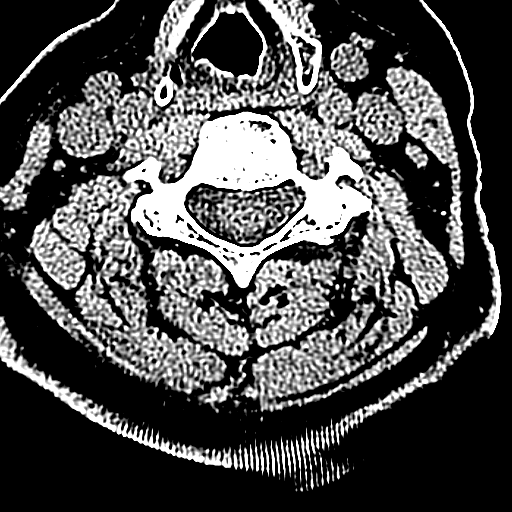
[im 46/83  bone]
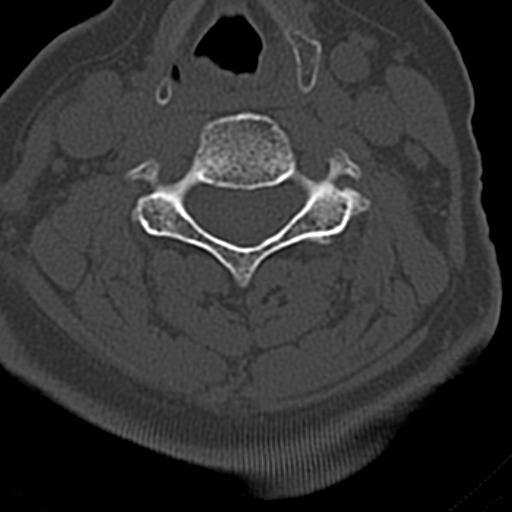
[im 55/83  brain]
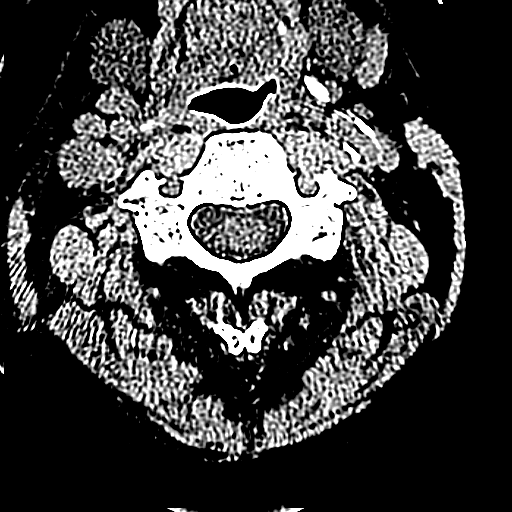
[im 64/83  brain]
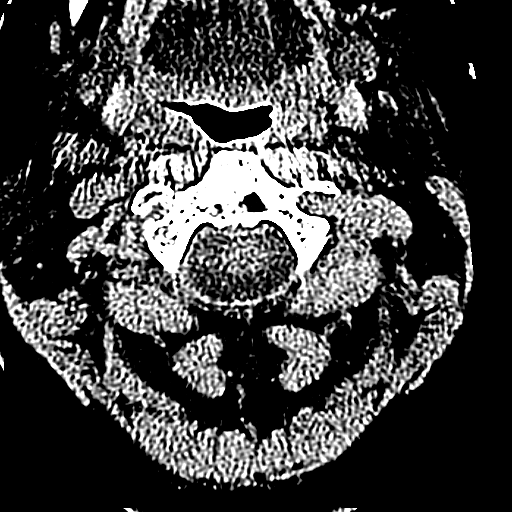
[im 73/83  brain]
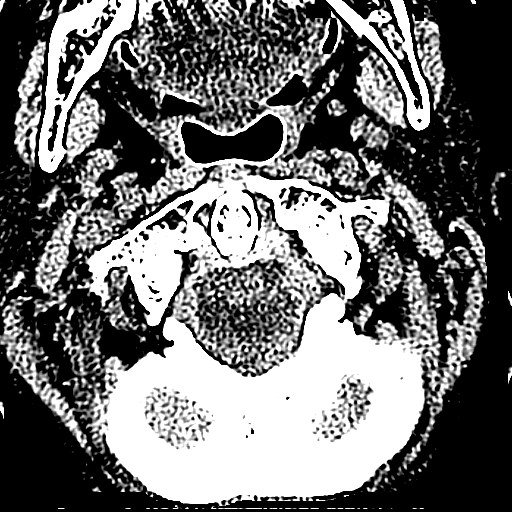

[Series 9: coronal · coronal · 0.23mm/px · 3 of 61 slices shown]
[im 21/61  brain]
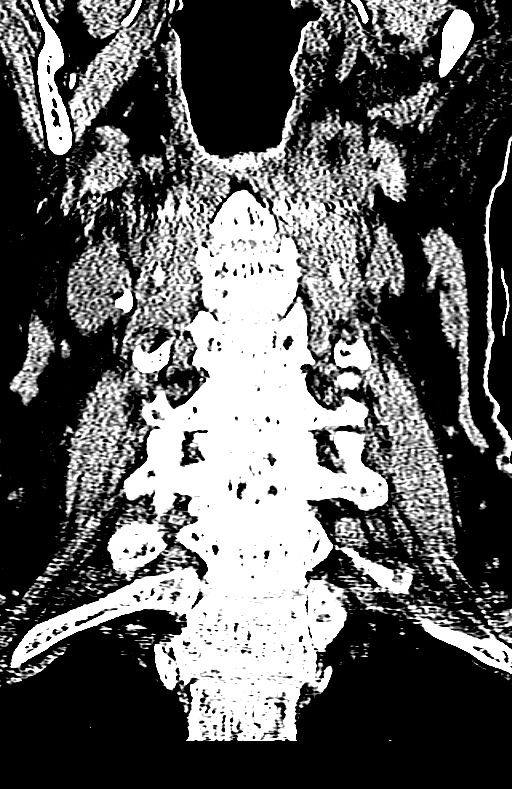
[im 27/61  brain]
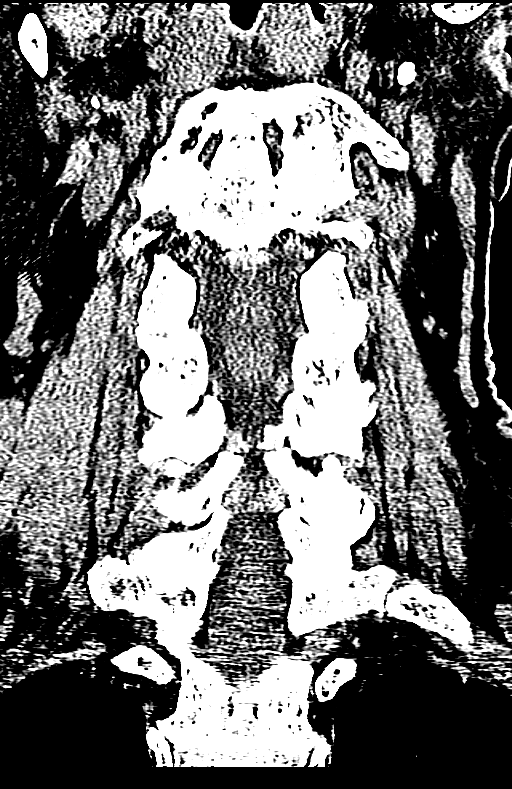
[im 34/61  brain]
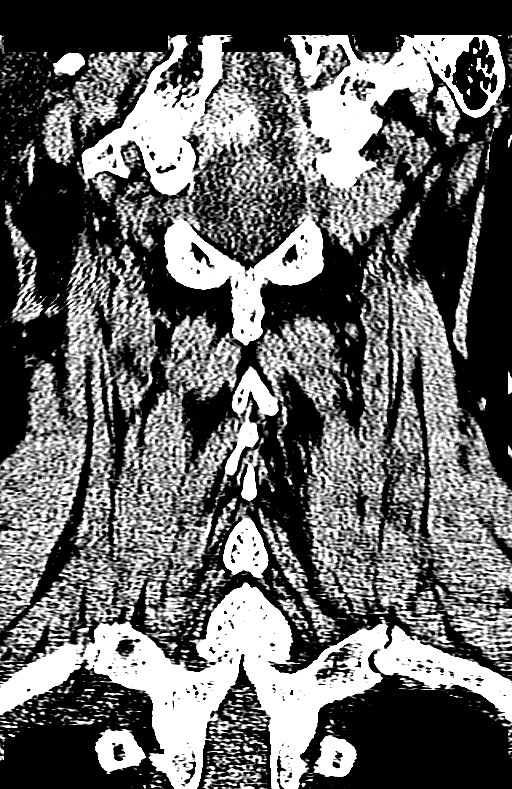

[Series 10: sagittal · sagittal · 0.27mm/px · 3 of 61 slices shown]
[im 21/61  brain]
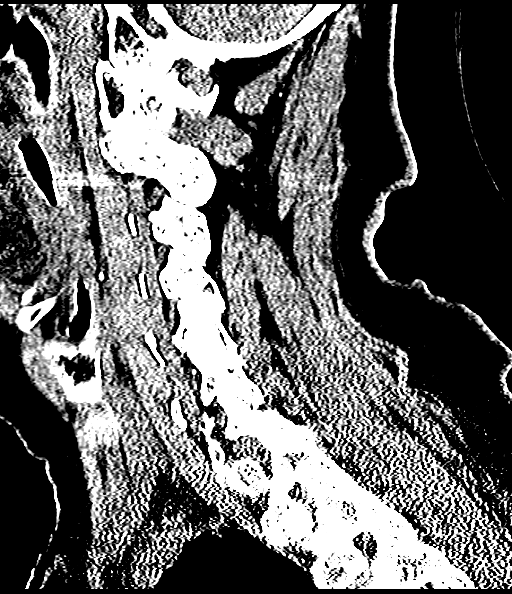
[im 31/61  brain]
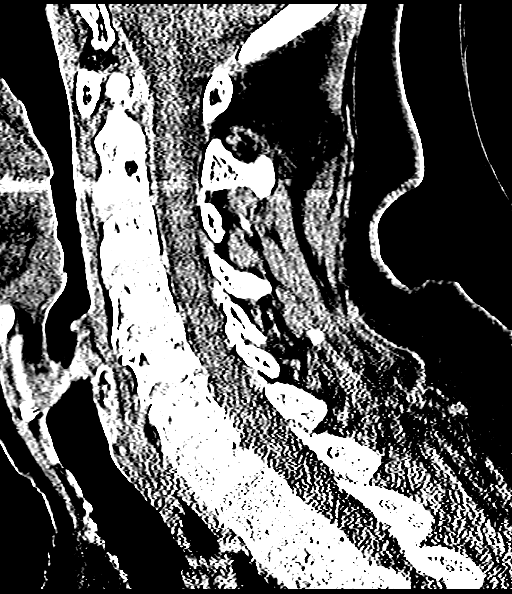
[im 41/61  brain]
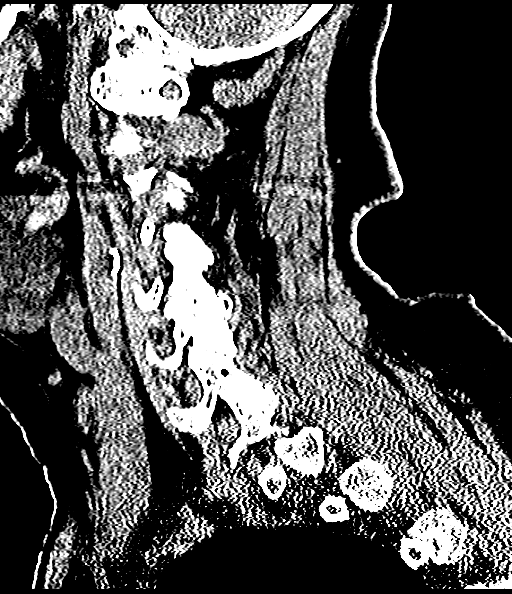

[14 of 47 positions shown; findings below may reference images not displayed]

FINDINGS: CT HEAD FINDINGS

There is no evidence of acute infarction, mass lesion, or intra- or
extra-axial hemorrhage on CT.

Prominence of ventricles and sulci reflects moderate cortical volume
loss. Cerebellar atrophy is noted. Scattered periventricular white
matter change likely reflects small vessel ischemic microangiopathy.

The brainstem and fourth ventricle are within normal limits. The
basal ganglia are unremarkable in appearance. The cerebral
hemispheres demonstrate grossly normal gray-white differentiation.
No mass effect or midline shift is seen.

There is no evidence of fracture; visualized osseous structures are
unremarkable in appearance. The orbits are within normal limits. The
paranasal sinuses and mastoid air cells are well-aerated. No
significant soft tissue abnormalities are seen.

CT CERVICAL SPINE FINDINGS

There is no evidence of fracture or subluxation. Vertebral bodies
demonstrate normal height and alignment. Intervertebral disc spaces
are preserved. Scattered small anterior and posterior disc
osteophyte complexes are noted along the cervical spine. Mild
underlying facet disease is noted. Prevertebral soft tissues are
within normal limits.

The thyroid gland is unremarkable in appearance. Mild scarring is
noted at the lung apices. Mild calcification is noted at the carotid
bifurcations bilaterally.
IMPRESSION: 1. No evidence of traumatic intracranial injury or fracture.
2. No evidence of fracture or subluxation along the cervical spine.
3. Moderate cortical volume loss and scattered small vessel ischemic
microangiopathy.
4. Minimal degenerative change along the cervical spine.
5. Mild scarring at the lung apices.
6. Mild calcification at the carotid bifurcations bilaterally.

## 2016-06-09 DIAGNOSIS — G0430 Acute necrotizing hemorrhagic encephalopathy, unspecified: Secondary | ICD-10-CM | POA: Diagnosis not present

## 2016-06-09 IMAGING — CR DG HIP (WITH OR WITHOUT PELVIS) 2-3V*L*
3 series · 3 of 3 positions shown · non-contrast
Comparison: Left hip radiographs performed 02/12/2015

CLINICAL DATA: Status post unwitnessed fall. Fell onto buttocks,
with posterior left hip pain. Initial encounter.

EXAM:
DG HIP (WITH OR WITHOUT PELVIS) 2-3V LEFT

[w hip lat left]
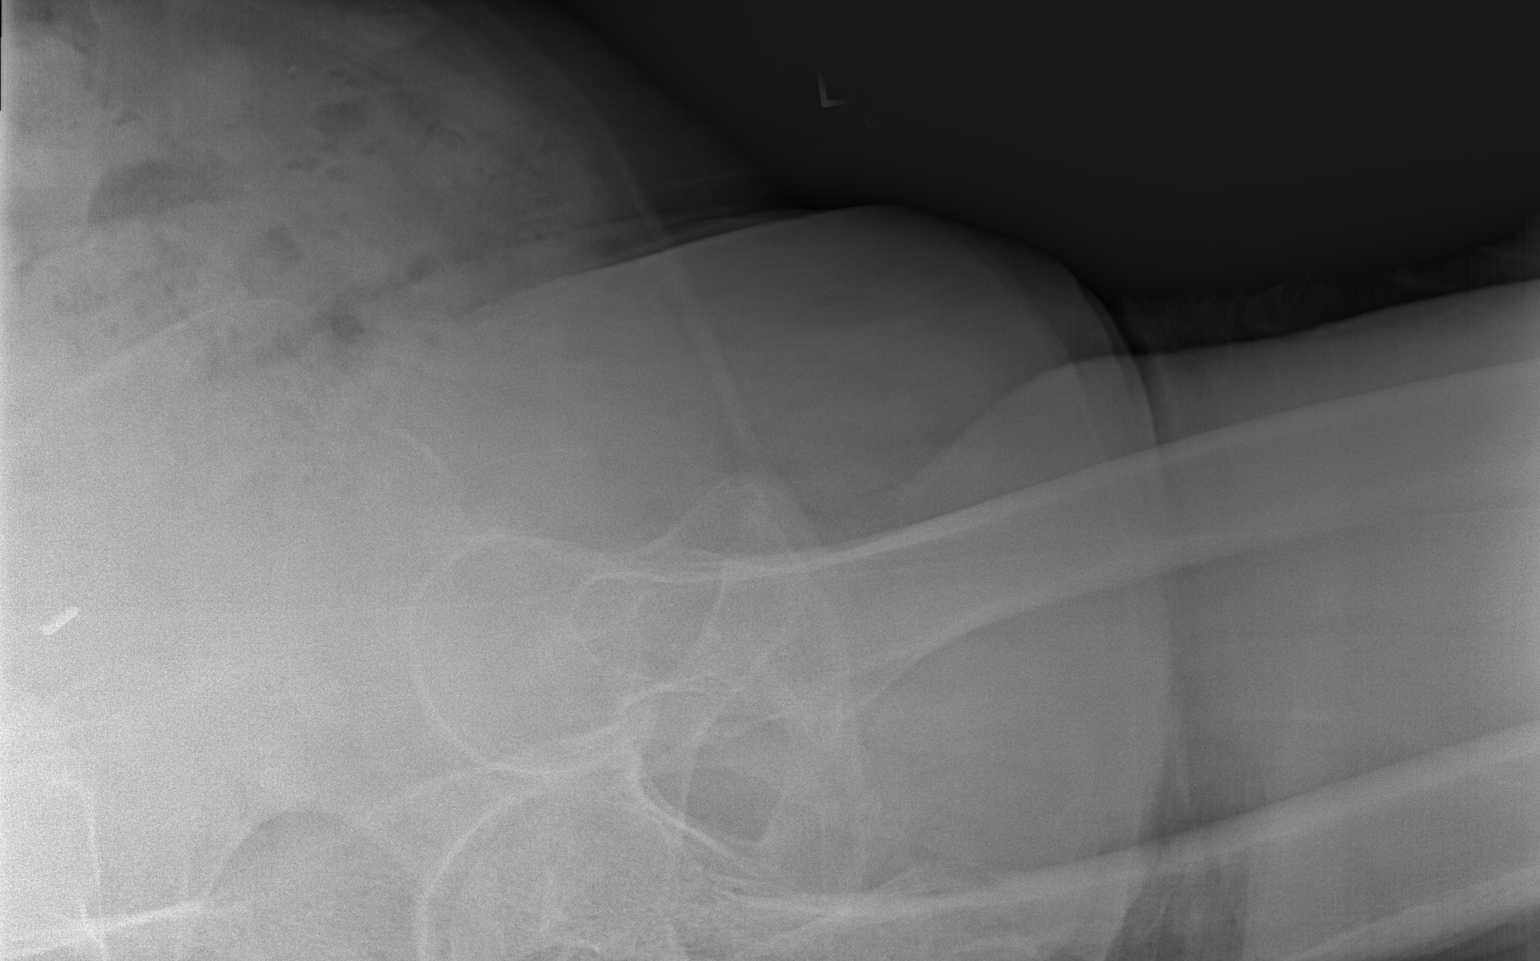

[x pelvis]
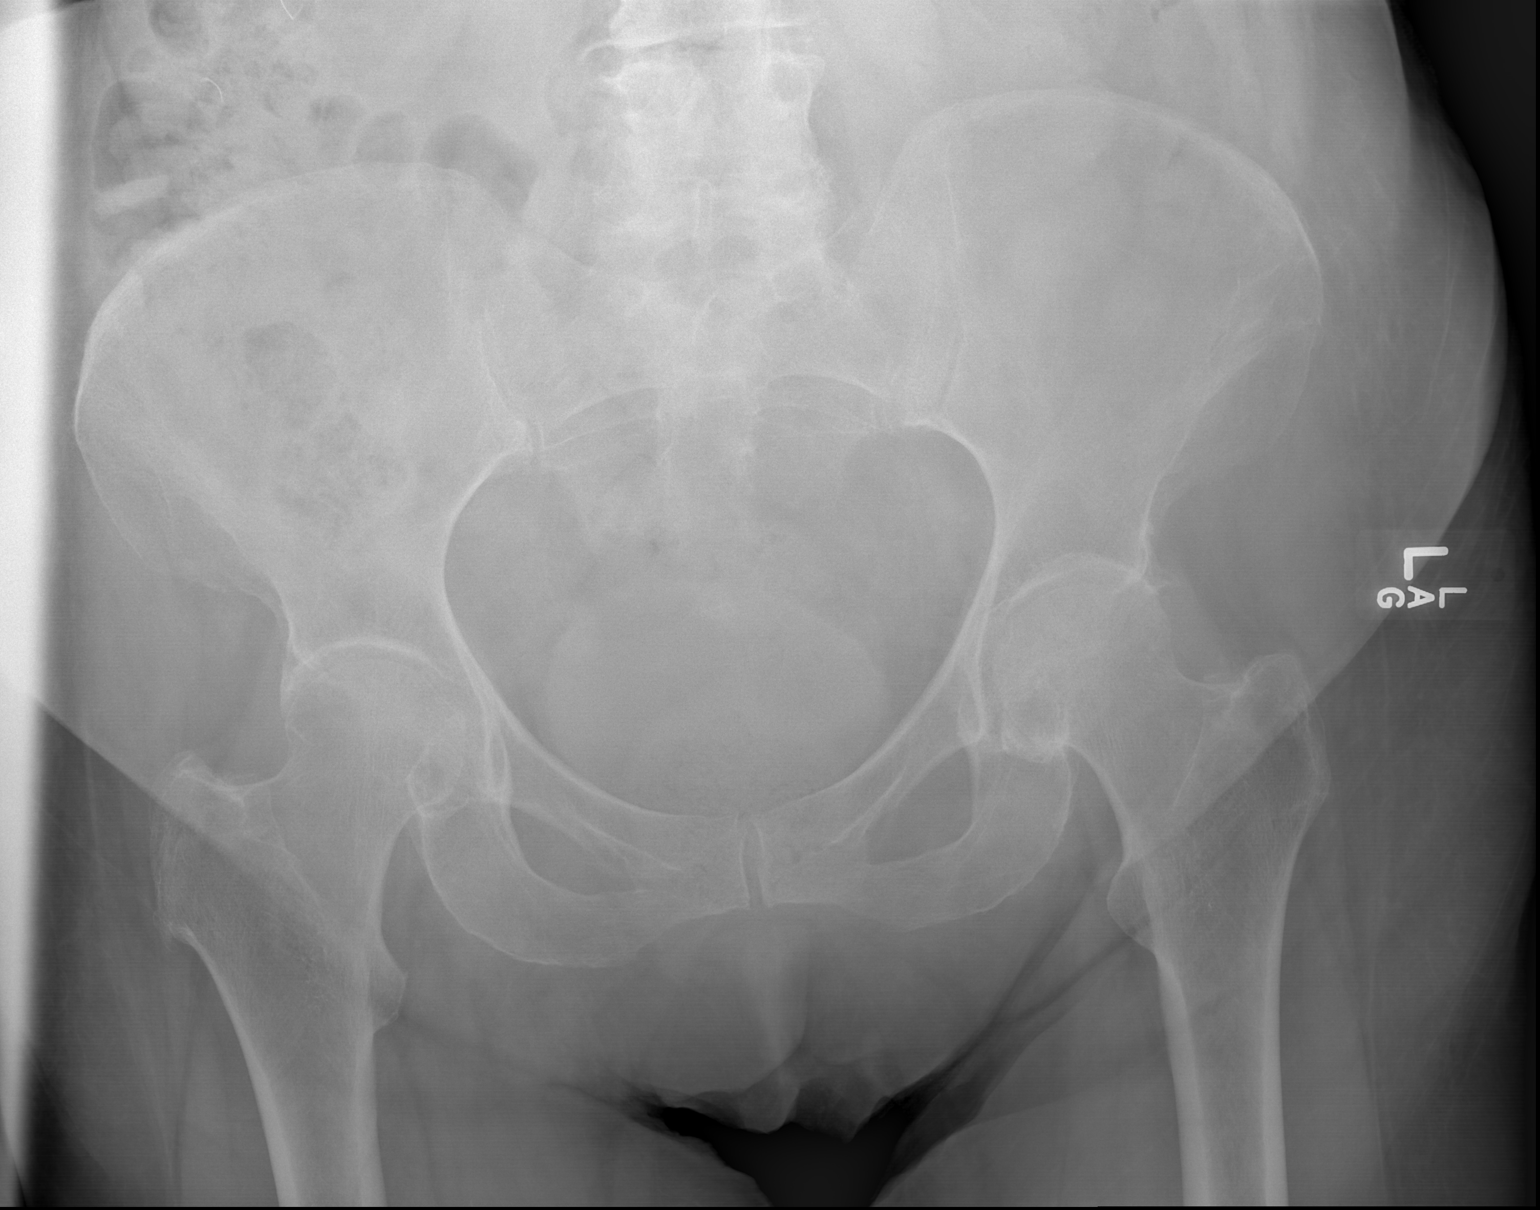

[x hip ap left]
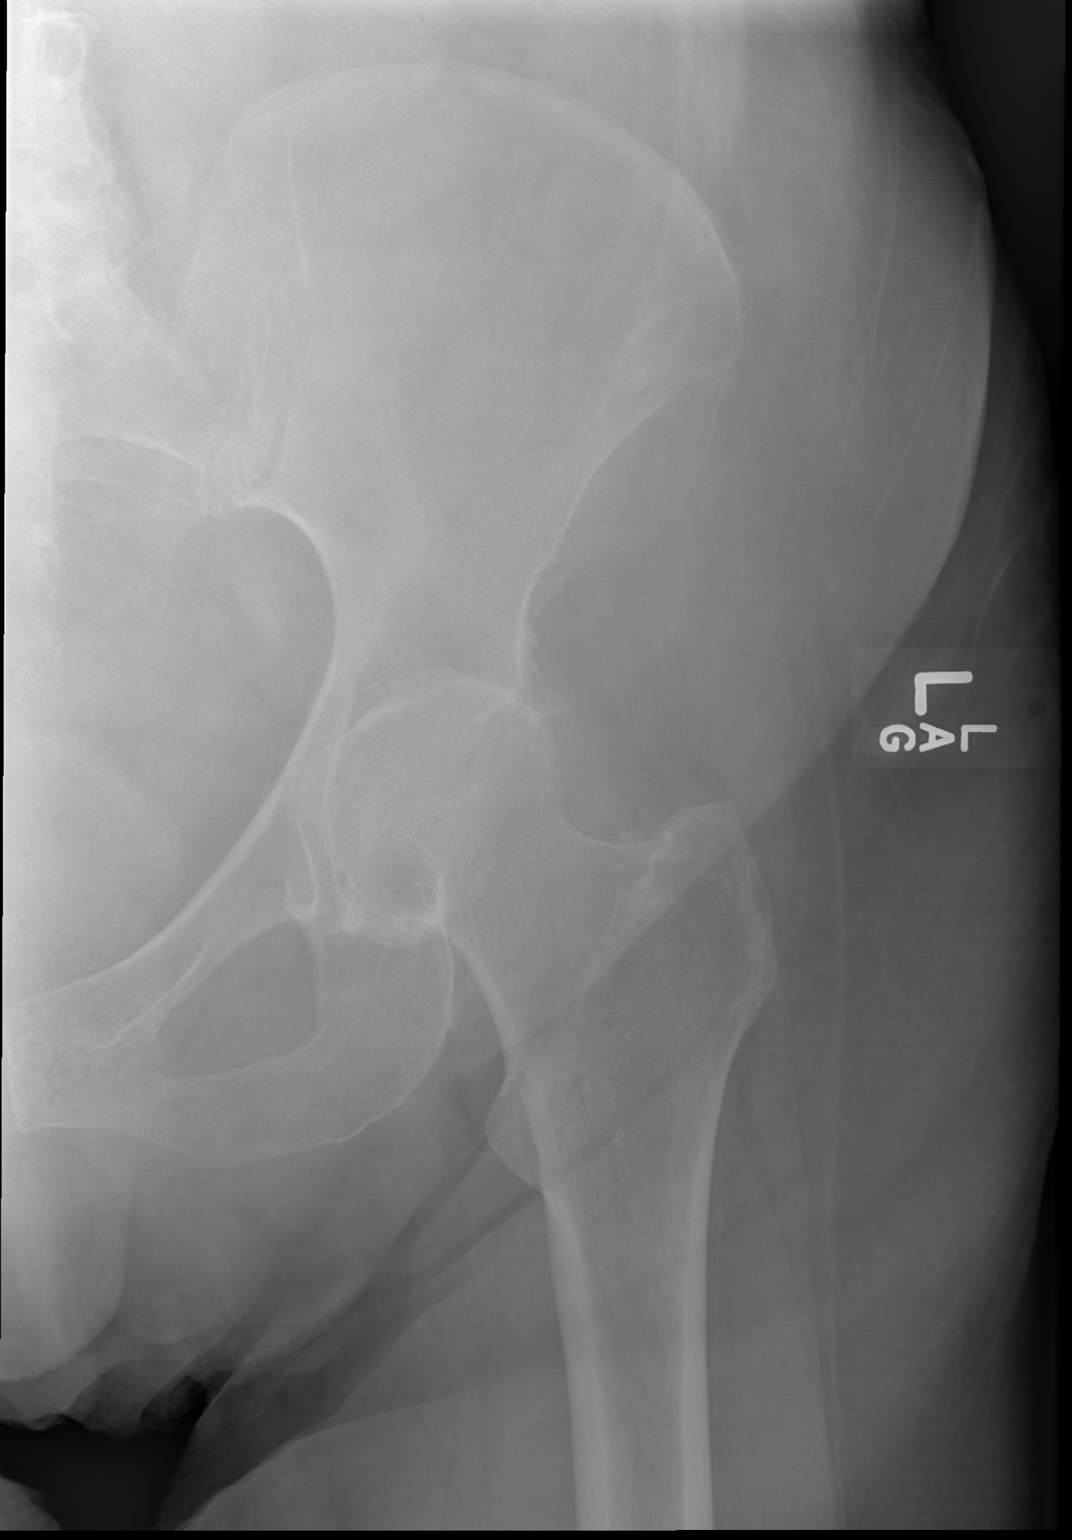

[3 of 3 positions shown; findings below may reference images not displayed]

FINDINGS: There is no evidence of fracture or dislocation. Both femoral heads
are seated normally within their respective acetabula. The proximal
left femur appears intact. Mild degenerative change is noted at the
lower lumbar spine. The sacroiliac joints are unremarkable in
appearance.

The visualized bowel gas pattern is grossly unremarkable in
appearance.
IMPRESSION: No evidence of fracture or dislocation.

## 2016-06-10 DIAGNOSIS — G0430 Acute necrotizing hemorrhagic encephalopathy, unspecified: Secondary | ICD-10-CM | POA: Diagnosis not present

## 2016-06-11 DIAGNOSIS — G0430 Acute necrotizing hemorrhagic encephalopathy, unspecified: Secondary | ICD-10-CM | POA: Diagnosis not present

## 2016-06-12 DIAGNOSIS — G0430 Acute necrotizing hemorrhagic encephalopathy, unspecified: Secondary | ICD-10-CM | POA: Diagnosis not present

## 2016-06-13 DIAGNOSIS — G0430 Acute necrotizing hemorrhagic encephalopathy, unspecified: Secondary | ICD-10-CM | POA: Diagnosis not present

## 2016-06-14 DIAGNOSIS — G0430 Acute necrotizing hemorrhagic encephalopathy, unspecified: Secondary | ICD-10-CM | POA: Diagnosis not present

## 2016-06-15 DIAGNOSIS — G0430 Acute necrotizing hemorrhagic encephalopathy, unspecified: Secondary | ICD-10-CM | POA: Diagnosis not present

## 2016-06-16 DIAGNOSIS — G0430 Acute necrotizing hemorrhagic encephalopathy, unspecified: Secondary | ICD-10-CM | POA: Diagnosis not present

## 2016-06-17 DIAGNOSIS — G0430 Acute necrotizing hemorrhagic encephalopathy, unspecified: Secondary | ICD-10-CM | POA: Diagnosis not present

## 2016-06-18 DIAGNOSIS — G0430 Acute necrotizing hemorrhagic encephalopathy, unspecified: Secondary | ICD-10-CM | POA: Diagnosis not present

## 2016-06-19 DIAGNOSIS — G0430 Acute necrotizing hemorrhagic encephalopathy, unspecified: Secondary | ICD-10-CM | POA: Diagnosis not present

## 2016-06-20 DIAGNOSIS — G0430 Acute necrotizing hemorrhagic encephalopathy, unspecified: Secondary | ICD-10-CM | POA: Diagnosis not present

## 2016-06-21 DIAGNOSIS — G0430 Acute necrotizing hemorrhagic encephalopathy, unspecified: Secondary | ICD-10-CM | POA: Diagnosis not present

## 2016-06-22 ENCOUNTER — Emergency Department (HOSPITAL_COMMUNITY)
Admission: EM | Admit: 2016-06-22 | Discharge: 2016-06-22 | Disposition: A | Discharge status: Home / Self Care | Payer: Medicare HMO | Attending: Emergency Medicine | Admitting: Emergency Medicine

## 2016-06-22 ENCOUNTER — Emergency Department (HOSPITAL_COMMUNITY): Payer: Medicare HMO

## 2016-06-22 ENCOUNTER — Encounter (HOSPITAL_COMMUNITY): Payer: Self-pay | Admitting: Emergency Medicine

## 2016-06-22 DIAGNOSIS — J45909 Unspecified asthma, uncomplicated: Secondary | ICD-10-CM | POA: Insufficient documentation

## 2016-06-22 DIAGNOSIS — I129 Hypertensive chronic kidney disease with stage 1 through stage 4 chronic kidney disease, or unspecified chronic kidney disease: Secondary | ICD-10-CM | POA: Diagnosis not present

## 2016-06-22 DIAGNOSIS — E039 Hypothyroidism, unspecified: Secondary | ICD-10-CM | POA: Insufficient documentation

## 2016-06-22 DIAGNOSIS — K0889 Other specified disorders of teeth and supporting structures: Secondary | ICD-10-CM | POA: Diagnosis not present

## 2016-06-22 DIAGNOSIS — R531 Weakness: Secondary | ICD-10-CM | POA: Diagnosis not present

## 2016-06-22 DIAGNOSIS — Z96641 Presence of right artificial hip joint: Secondary | ICD-10-CM | POA: Diagnosis not present

## 2016-06-22 DIAGNOSIS — Z7982 Long term (current) use of aspirin: Secondary | ICD-10-CM | POA: Insufficient documentation

## 2016-06-22 DIAGNOSIS — N189 Chronic kidney disease, unspecified: Secondary | ICD-10-CM | POA: Diagnosis not present

## 2016-06-22 DIAGNOSIS — R6884 Jaw pain: Secondary | ICD-10-CM | POA: Diagnosis not present

## 2016-06-22 DIAGNOSIS — Z87891 Personal history of nicotine dependence: Secondary | ICD-10-CM | POA: Insufficient documentation

## 2016-06-22 DIAGNOSIS — N3000 Acute cystitis without hematuria: Secondary | ICD-10-CM

## 2016-06-22 DIAGNOSIS — R05 Cough: Secondary | ICD-10-CM | POA: Diagnosis not present

## 2016-06-22 DIAGNOSIS — G0430 Acute necrotizing hemorrhagic encephalopathy, unspecified: Secondary | ICD-10-CM | POA: Diagnosis not present

## 2016-06-22 DIAGNOSIS — R300 Dysuria: Secondary | ICD-10-CM | POA: Diagnosis present

## 2016-06-22 LAB — COMPREHENSIVE METABOLIC PANEL
ALBUMIN: 3.6 g/dL (ref 3.5–5.0)
ALK PHOS: 78 U/L (ref 38–126)
ALT: 22 U/L (ref 14–54)
ANION GAP: 8 (ref 5–15)
AST: 20 U/L (ref 15–41)
BILIRUBIN TOTAL: 0.9 mg/dL (ref 0.3–1.2)
BUN: 19 mg/dL (ref 6–20)
CALCIUM: 9.3 mg/dL (ref 8.9–10.3)
CO2: 30 mmol/L (ref 22–32)
CREATININE: 0.74 mg/dL (ref 0.44–1.00)
Chloride: 101 mmol/L (ref 101–111)
GFR calc Af Amer: >60 mL/min (ref >60)
GFR calc non Af Amer: >60 mL/min (ref >60)
GLUCOSE: 106 mg/dL — AB (ref 65–99)
Potassium: 3.8 mmol/L (ref 3.5–5.1)
Sodium: 139 mmol/L (ref 135–145)
TOTAL PROTEIN: 7.5 g/dL (ref 6.5–8.1)

## 2016-06-22 LAB — URINALYSIS, ROUTINE W REFLEX MICROSCOPIC
BILIRUBIN URINE: NEGATIVE
Glucose, UA: NEGATIVE mg/dL
Hgb urine dipstick: NEGATIVE
KETONES UR: 20 mg/dL — AB
Nitrite: NEGATIVE
PROTEIN: NEGATIVE mg/dL
Specific Gravity, Urine: 1.021 (ref 1.005–1.030)
pH: 5 (ref 5.0–8.0)

## 2016-06-22 LAB — CBC WITH DIFFERENTIAL/PLATELET
Basophils Absolute: 0 10*3/uL (ref 0.0–0.1)
Basophils Relative: 0 %
Eosinophils Absolute: 0.1 10*3/uL (ref 0.0–0.7)
Eosinophils Relative: 1 %
HEMATOCRIT: 38.8 % (ref 36.0–46.0)
HEMOGLOBIN: 13 g/dL (ref 12.0–15.0)
Lymphocytes Relative: 21 %
Lymphs Abs: 1.9 10*3/uL (ref 0.7–4.0)
MCH: 29.2 pg (ref 26.0–34.0)
MCHC: 33.5 g/dL (ref 30.0–36.0)
MCV: 87.2 fL (ref 78.0–100.0)
MONOS PCT: 6 %
Monocytes Absolute: 0.6 10*3/uL (ref 0.1–1.0)
NEUTROS ABS: 6.3 10*3/uL (ref 1.7–7.7)
NEUTROS PCT: 72 %
Platelets: 225 10*3/uL (ref 150–400)
RBC: 4.45 MIL/uL (ref 3.87–5.11)
RDW: 12.9 % (ref 11.5–15.5)
WBC: 8.8 10*3/uL (ref 4.0–10.5)

## 2016-06-22 LAB — PROTIME-INR
INR: 1
Prothrombin Time: 13.2 seconds (ref 11.4–15.2)

## 2016-06-22 LAB — I-STAT CG4 LACTIC ACID, ED: Lactic Acid, Venous: 1.01 mmol/L (ref 0.5–1.9)

## 2016-06-22 MED ORDER — ACETAMINOPHEN 325 MG PO TABS
650.0000 mg | ORAL_TABLET | Freq: Once | ORAL | Status: DC
  Filled 2016-06-22: qty 2

## 2016-06-22 MED ORDER — FOSFOMYCIN TROMETHAMINE 3 G PO PACK
3.0000 g | PACK | Freq: Once | ORAL | Status: AC
  Administered 2016-06-22: 3 g via ORAL
  Filled 2016-06-22: qty 3

## 2016-06-22 NOTE — ED Notes (Signed)
Bed: [id number] Expected date:  Expected time:  Means of arrival:  Comments: 83 yo tooth pain

## 2016-06-22 NOTE — ED Notes (Addendum)
Pt refused to take tylenol. Wants it at bedside for when she is ready. Pt refused sandwich

## 2016-06-22 NOTE — Discharge Instructions (Signed)
Your burning when you PE is likely caused by a urinary tract infection. Your urinalysis showed evidence of UTI. He was given a dose of antibiotics in the emergency department to treat this. You do not need to continue antibiotics at this time as it was a one-time dose. We did not find any evidence of dental injury or infection at this time. He did not have any findings of pneumonia on your chest x-ray. Please stay hydrated and follow-up with your primary physician. If any symptoms return or worsen, please return to the nearest emergency department.

## 2016-06-22 NOTE — ED Triage Notes (Signed)
[facility] EMS[facility]. Hx of dementia. Facility reports pt has been complaining of dental pain for the past 2 days. Will not specify which side.

## 2016-06-22 NOTE — ED Notes (Signed)
Took pt to restroom. Pt was unable to urinate. Changed brief.

## 2016-06-22 NOTE — ED Notes (Signed)
Pt not in room to collect blood work

## 2016-06-22 NOTE — ED Notes (Signed)
Pt in xray

## 2016-06-22 NOTE — ED Notes (Signed)
Per MD pt does not need second I-STAT CG4

## 2016-06-22 NOTE — ED Provider Notes (Signed)
[facility]-EMERGENCY DEPT Provider Note   CSN: [id number] Arrival date & time: 06/22/16  7829     History   Chief Complaint Chief Complaint  Patient presents with  . Dental Pain    HPI [name] is a 83 y.o. female with a past medical history significant for dementia, hypertension, and chronic kidney disease who presents from her nursing facility for reported dental pain who is complaining of cough, congestion, and dysuria. Patient completely denies dental pain which is what EMS told nursing was her chief complaint. She denies any difficulty swallowing, moving her jaw, or moving her neck. She reports congestion and nonproductive cough. She denies fevers or chills. She denies chest pain or shortness of breath. She denies any nausea or vomiting or abdominal pain. She denies constipation or diarrhea but does report some dysuria. She denies any recent falls or any other pain today.     HPI  Past Medical History:  Diagnosis Date  . Acute bronchitis 04/10/2013  . Anemia   . Angina   . Anxiety   . Arthritis   . Arthritis 10/02/2012  . Asthma   . Chicken pox   . Chronic kidney disease   . Colon polyps   . Dehydration 08/31/2013  . Dementia   . Depression   . Dysrhythmia   . Headache(784.0)   . Heart murmur   . Hypertension   . Hypothyroidism   . Left leg pain 10/02/2012  . Pneumonia   . Recurrent upper respiratory infection (URI)   . Shortness of breath   . UTI (urinary tract infection) 12/30/2012  . Valvular heart disease 09/04/2012    Patient Active Problem List   Diagnosis Date Noted  . Pressure ulcer 09/20/2015  . Displaced fracture of right femoral neck (HCC) 09/19/2015  . Altered mental status   . Confusion 06/26/2015  . Fall   . Aspiration pneumonia (HCC) 01/10/2015  . Acute encephalopathy 01/08/2015  . UTI (urinary tract infection) 01/08/2015  . 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 Recurrent falls 11/27/2014  . FTT (failure to thrive) in adult  10/26/2014  . Dehydration 10/26/2014  . Abnormal urinalysis 10/26/2014  . Normocytic anemia 10/26/2014  . Lump of skin of left upper extremity 09/07/2014  . Rash and nonspecific skin eruption 05/08/2014  . Syncope and collapse 11/30/2013  . Low back pain, episodic 11/20/2013  . Chronic kidney disease   . Insomnia 08/31/2013  . Arthritis 10/02/2012  . Valvular heart disease 09/04/2012  . Restless leg syndrome 06/06/2012  . Trochanteric bursitis 08/02/2011  . Dementia 08/02/2011  . Leg pain 03/28/2011  . Hypertension 09/22/2010  . Hypothyroidism 09/22/2010    Past Surgical History:  Procedure Laterality Date  . ABDOMINAL HYSTERECTOMY    . APPENDECTOMY    . BACK SURGERY    . CHOLECYSTECTOMY    . COLONOSCOPY W/ POLYPECTOMY    . EYE SURGERY     cataract removed and eye lids lifted  . feet surgery    . FRACTURE SURGERY     bilateral arms  . HAND SURGERY    . HIP ARTHROPLASTY Right 09/20/2015   Procedure: ARTHROPLASTY RIGHT  BIPOLAR ANTERIOR HIP (HEMIARTHROPLASTY);  Surgeon: Samson Frederic, MD;  Location: [facility];  Service: Orthopedics;  Laterality: Right;  . kidney stones    . SHOULDER ARTHROSCOPY    . TONSILLECTOMY    . TONSILLECTOMY    . TUBAL LIGATION      OB History    No data available  Home Medications    Prior to Admission medications   Medication Sig Start Date End Date Taking? Authorizing Provider  acetaminophen (TYLENOL) 500 MG tablet Take 500 mg by mouth every 4 (four) hours as needed for mild pain, moderate pain, fever or headache.    Yes Historical Provider, MD  alum & mag hydroxide-simeth (MINTOX) 200-200-20 MG/5ML suspension Take 30 mLs by mouth every 6 (six) hours as needed for indigestion or heartburn.    Yes Historical Provider, MD  aspirin 81 MG chewable tablet Chew 81 mg by mouth daily.   Yes Historical Provider, MD  clonazePAM (KLONOPIN) 0.5 MG tablet Take 1 tablet (0.5 mg total) by mouth 2 (two) times daily. Patient taking differently: Take  0.25-0.5 mg by mouth 3 (three) times daily. Take 0.5 bid daily at 0800 and 2000. Take 0.25 daily at1400 09/23/15  Yes Elease Etienne, MD  divalproex (DEPAKOTE SPRINKLE) 125 MG capsule Take 125 mg by mouth 3 (three) times daily.    Yes Historical Provider, MD  escitalopram (LEXAPRO) 5 MG tablet Take 5 mg by mouth daily.   Yes Historical Provider, MD  guaifenesin (ROBITUSSIN) 100 MG/5ML syrup Take 200 mg by mouth every 6 (six) hours as needed for cough.    Yes Historical Provider, MD  levothyroxine (SYNTHROID, LEVOTHROID) 50 MCG tablet Take 50 mcg by mouth daily before breakfast.   Yes Historical Provider, MD  loperamide (IMODIUM) 2 MG capsule Take 2 mg by mouth as needed for diarrhea or loose stools.    Yes Historical Provider, MD  magnesium hydroxide (MILK OF MAGNESIA) 400 MG/5ML suspension Take 30 mLs by mouth at bedtime as needed for mild constipation.   Yes Historical Provider, MD  Multiple Vitamins-Minerals (MULTIVITAMIN WITH MINERALS) tablet Take 1 tablet by mouth daily.   Yes Historical Provider, MD  Nutritional Supplements (NUTRITIONAL DRINK) LIQD Take 1 Bottle by mouth 3 (three) times daily with meals. Mighty Shakes   Yes Historical Provider, MD  rivastigmine (EXELON) 1.5 MG capsule Take 1.5 mg by mouth 2 (two) times daily.   Yes Historical Provider, MD  senna (SENOKOT) 8.6 MG TABS tablet Take 2 tablets by mouth 2 (two) times daily.   Yes Historical Provider, MD  Skin Protectants, Misc. (BAZA PROTECT EX) Apply 1 application topically as needed (after each diaper change).   Yes Historical Provider, MD  traMADol (ULTRAM) 50 MG tablet Take 1 tablet (50 mg total) by mouth every 6 (six) hours as needed for severe pain. Patient taking differently: Take 50 mg by mouth 2 (two) times daily.  09/23/15  Yes Elease Etienne, MD  traZODone (DESYREL) 50 MG tablet Take 50 mg by mouth at bedtime.    Yes Historical Provider, MD  cephALEXin (KEFLEX) 500 MG capsule Take 1 capsule (500 mg total) by mouth 4  (four) times daily. Patient not taking: Reported on 06/22/2016 04/25/16   Chase Picket Ward, PA-C    Family History Family History  Problem Relation Age of Onset  . Heart disease Mother   . Heart disease Father   . Heart disease Other   . Birth defects Other     Social History Social History  Substance Use Topics  . Smoking status: Former Smoker    Packs/day: 0.20    Years: 1.00    Types: Cigarettes    Quit date: 06/02/1941  . Smokeless tobacco: Never Used  . Alcohol use No     Allergies   Morphine and related; Codeine; and Hydrocodone   Review of Systems  Review of Systems  Unable to perform ROS: Dementia  Constitutional: Negative for chills, diaphoresis and fever.  HENT: Positive for congestion.   Eyes: Negative for visual disturbance.  Respiratory: Positive for cough. Negative for chest tightness and shortness of breath.   Genitourinary: Positive for dysuria.  Neurological: Negative for light-headedness and headaches.     Physical Exam Updated Vital Signs BP 146/59   Pulse 69   Temp 98.2 F (36.8 C) (Oral)   Resp 16   SpO2 96%   Physical Exam  Constitutional: She is oriented to person, place, and time. She appears well-developed and well-nourished. No distress.  HENT:  Head: Normocephalic and atraumatic.  Right Ear: External ear normal.  Left Ear: External ear normal.  Nose: Nose normal.  Mouth/Throat: Oropharynx is clear and moist. No oropharyngeal exudate.  Eyes: Conjunctivae and EOM are normal. Pupils are equal, round, and reactive to light. Right eye exhibits no discharge. Left eye exhibits no discharge.  Neck: Normal range of motion. Neck supple.  Cardiovascular: Regular rhythm, normal heart sounds and intact distal pulses.   Pulmonary/Chest: Effort normal. No stridor. No respiratory distress. She has no wheezes. She exhibits no tenderness.  Abdominal: Soft. Bowel sounds are normal. She exhibits no distension. There is no tenderness. There is no  rebound.  Musculoskeletal: She exhibits no tenderness.  Lymphadenopathy:    She has no cervical adenopathy.  Neurological: She is alert and oriented to person, place, and time. She has normal reflexes. No sensory deficit. She exhibits normal muscle tone. Coordination normal.  Skin: Skin is warm. Capillary refill takes less than 2 seconds. No rash noted. She is not diaphoretic. No erythema.  Nursing note and vitals reviewed.    ED Treatments / Results  Labs (all labs ordered are listed, but only abnormal results are displayed) Labs Reviewed  COMPREHENSIVE METABOLIC PANEL - Abnormal; Notable for the following:       Result Value   Glucose, Bld 106 (*)    All other components within normal limits  URINALYSIS, ROUTINE W REFLEX MICROSCOPIC - Abnormal; Notable for the following:    Color, Urine AMBER (*)    APPearance HAZY (*)    Ketones, ur 20 (*)    Leukocytes, UA MODERATE (*)    Bacteria, UA RARE (*)    Squamous Epithelial / LPF 0-5 (*)    All other components within normal limits  URINE CULTURE  CBC WITH DIFFERENTIAL/PLATELET  PROTIME-INR  I-STAT CG4 LACTIC ACID, ED    EKG  EKG Interpretation None       Radiology Dg Chest 2 View  Result Date: 06/22/2016 CLINICAL DATA:  Cough and tachycardia EXAM: CHEST  2 VIEW COMPARISON:  04/25/2016 FINDINGS: The heart size and mediastinal contours are within normal limits. Both lungs are clear. The visualized skeletal structures are unremarkable. IMPRESSION: No active cardiopulmonary disease. Electronically Signed   By: Signa Kell M.D.   On: 06/22/2016 09:30    Procedures Procedures (including critical care time)  Medications Ordered in ED Medications  fosfomycin (MONUROL) packet 3 g (3 g Oral Given 06/22/16 1320)     Initial Impression / Assessment and Plan / ED Course  I have reviewed the triage vital signs and the nursing notes.  Pertinent labs & imaging results that were available during my care of the patient were  reviewed by me and considered in my medical decision making (see chart for details).     [name] is a 83 y.o. female with a past  medical history significant for dementia, hypertension, and chronic kidney disease who presents from her nursing facility for reported dental pain who is complaining of cough, congestion, and dysuria.  History and exam are seen above.  On exam, patient has poor dentition but no evidence of peritonsillar abscess. Patient is full range of motion of neck, doubt RPA. Patient has no tenderness on her teeth or jaws. No evidence of dental abscess present. No evidence of Ludwig angina. Lungs were clear and chest nontender. Abdomen nontender. Patient has normal sensation and strength on exam.  Given patient's report of cough, congestion, and dysuria, patient will have workup to look for occult infection. Anticipate discharge if workup is negative.  Diagnostic workup revealed evidence of urinary tract infection with leukocytes, bacteria, and reported dysuria. Patient will be treated with one dose of fosfomycin in the emergency department. As patient has no leukocytosis, normal kidney function, and has no CVA tenderness, doubt pyelonephritis. Other laboratory testing was not significantly abnormal. Chest x-ray revealed no pneumonia.  Patient will be discharged back to her facility. 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 Patient given return precautions for any new or worsening symptoms.   Final Clinical Impressions(s) / ED Diagnoses   Final diagnoses:  Acute cystitis without hematuria    New Prescriptions New Prescriptions   No medications on file    Clinical Impression: 1. Acute cystitis without hematuria     Disposition: Discharge  Condition: Good  I have discussed the results, Dx and Tx plan with the pt(& family if present). He/she/they expressed understanding and agree(s) with the plan.  Discharge instructions discussed at great length. Strict return precautions discussed and pt &/or family have verbalized understanding of the instructions. No further questions at time of discharge.    New Prescriptions   No medications on file    Follow Up: [facility] [address] [place] [id number] [phone]    [facility] [address] [id number] [address]c [address] [phone]  If symptoms worsen     Heide Scaleshristopher J Tegeler, MD 06/23/16 1004

## 2016-06-22 NOTE — ED Notes (Signed)
Pt denied dental pain to EDP. Then complained of cough (none noted in ED) and dysuria.

## 2016-06-22 NOTE — ED Notes (Signed)
Bed: [facility] Expected date:  Expected time:  Means of arrival:  Comments: 

## 2016-06-23 DIAGNOSIS — G0430 Acute necrotizing hemorrhagic encephalopathy, unspecified: Secondary | ICD-10-CM | POA: Diagnosis not present

## 2016-06-23 DIAGNOSIS — G309 Alzheimer's disease, unspecified: Secondary | ICD-10-CM | POA: Diagnosis not present

## 2016-06-23 DIAGNOSIS — F0281 Dementia in other diseases classified elsewhere with behavioral disturbance: Secondary | ICD-10-CM | POA: Diagnosis not present

## 2016-06-23 DIAGNOSIS — Z87440 Personal history of urinary (tract) infections: Secondary | ICD-10-CM | POA: Diagnosis not present

## 2016-06-23 LAB — URINE CULTURE: Culture: NO GROWTH

## 2016-06-24 DIAGNOSIS — G0430 Acute necrotizing hemorrhagic encephalopathy, unspecified: Secondary | ICD-10-CM | POA: Diagnosis not present

## 2016-06-25 DIAGNOSIS — G0430 Acute necrotizing hemorrhagic encephalopathy, unspecified: Secondary | ICD-10-CM | POA: Diagnosis not present

## 2016-06-25 DIAGNOSIS — Z961 Presence of intraocular lens: Secondary | ICD-10-CM | POA: Diagnosis not present

## 2016-06-26 ENCOUNTER — Encounter (HOSPITAL_COMMUNITY): Payer: Self-pay

## 2016-06-26 ENCOUNTER — Emergency Department (HOSPITAL_COMMUNITY)
Admission: EM | Admit: 2016-06-26 | Discharge: 2016-06-27 | Disposition: A | Discharge status: Home / Self Care | Payer: Medicare HMO | Attending: Emergency Medicine | Admitting: Emergency Medicine

## 2016-06-26 DIAGNOSIS — K121 Other forms of stomatitis: Secondary | ICD-10-CM | POA: Diagnosis not present

## 2016-06-26 DIAGNOSIS — Z7982 Long term (current) use of aspirin: Secondary | ICD-10-CM | POA: Insufficient documentation

## 2016-06-26 DIAGNOSIS — I10 Essential (primary) hypertension: Secondary | ICD-10-CM | POA: Diagnosis not present

## 2016-06-26 DIAGNOSIS — Z87891 Personal history of nicotine dependence: Secondary | ICD-10-CM | POA: Diagnosis not present

## 2016-06-26 DIAGNOSIS — G309 Alzheimer's disease, unspecified: Secondary | ICD-10-CM | POA: Diagnosis not present

## 2016-06-26 DIAGNOSIS — E039 Hypothyroidism, unspecified: Secondary | ICD-10-CM | POA: Diagnosis not present

## 2016-06-26 DIAGNOSIS — Z96641 Presence of right artificial hip joint: Secondary | ICD-10-CM | POA: Insufficient documentation

## 2016-06-26 DIAGNOSIS — J45909 Unspecified asthma, uncomplicated: Secondary | ICD-10-CM | POA: Diagnosis not present

## 2016-06-26 DIAGNOSIS — R402411 Glasgow coma scale score 13-15, in the field [EMT or ambulance]: Secondary | ICD-10-CM | POA: Diagnosis not present

## 2016-06-26 DIAGNOSIS — K122 Cellulitis and abscess of mouth: Secondary | ICD-10-CM | POA: Diagnosis not present

## 2016-06-26 DIAGNOSIS — L03211 Cellulitis of face: Secondary | ICD-10-CM

## 2016-06-26 DIAGNOSIS — R6884 Jaw pain: Secondary | ICD-10-CM | POA: Diagnosis present

## 2016-06-26 LAB — BASIC METABOLIC PANEL
ANION GAP: 12 (ref 5–15)
BUN: 20 mg/dL (ref 6–20)
CALCIUM: 9.4 mg/dL (ref 8.9–10.3)
CO2: 27 mmol/L (ref 22–32)
Chloride: 102 mmol/L (ref 101–111)
Creatinine, Ser: 0.92 mg/dL (ref 0.44–1.00)
GFR, EST NON AFRICAN AMERICAN: 52 mL/min — AB (ref >60)
Glucose, Bld: 137 mg/dL — ABNORMAL HIGH (ref 65–99)
POTASSIUM: 3.7 mmol/L (ref 3.5–5.1)
SODIUM: 141 mmol/L (ref 135–145)

## 2016-06-26 LAB — CBC WITH DIFFERENTIAL/PLATELET
BASOS ABS: 0 10*3/uL (ref 0.0–0.1)
BASOS PCT: 0 %
EOS ABS: 0.1 10*3/uL (ref 0.0–0.7)
EOS PCT: 1 %
HCT: 37.3 % (ref 36.0–46.0)
Hemoglobin: 12.3 g/dL (ref 12.0–15.0)
LYMPHS PCT: 17 %
Lymphs Abs: 1.9 10*3/uL (ref 0.7–4.0)
MCH: 28.7 pg (ref 26.0–34.0)
MCHC: 33 g/dL (ref 30.0–36.0)
MCV: 87.1 fL (ref 78.0–100.0)
Monocytes Absolute: 0.6 10*3/uL (ref 0.1–1.0)
Monocytes Relative: 6 %
Neutro Abs: 8.5 10*3/uL — ABNORMAL HIGH (ref 1.7–7.7)
Neutrophils Relative %: 76 %
PLATELETS: 232 10*3/uL (ref 150–400)
RBC: 4.28 MIL/uL (ref 3.87–5.11)
RDW: 12.9 % (ref 11.5–15.5)
WBC: 11.1 10*3/uL — AB (ref 4.0–10.5)

## 2016-06-26 MED ORDER — SODIUM CHLORIDE 0.9 % IV BOLUS (SEPSIS)
500.0000 mL | Freq: Once | INTRAVENOUS | Status: AC
  Administered 2016-06-26: 500 mL via INTRAVENOUS

## 2016-06-26 MED ORDER — DEXTROSE 5 % IV SOLN
1.0000 g | Freq: Once | INTRAVENOUS | Status: AC
  Administered 2016-06-26: 1 g via INTRAVENOUS
  Filled 2016-06-26: qty 10

## 2016-06-26 NOTE — ED Notes (Signed)
Took pt dentures out and cleaned and placed them into a denture cup at bedside.

## 2016-06-26 NOTE — ED Provider Notes (Signed)
[facility]-EMERGENCY DEPT Provider Note   CSN: [id number] Arrival date & time: 06/26/16  2128     History   Chief Complaint Chief Complaint  Patient presents with  . Jaw Pain    pt with jaw pain since last week pt was seen and evaluated here for jaw pain and w/u was unremarkable on the 21st     HPI [name] is a 83 y.o. female.  HPI Patient presents to the emergency room with complaints of jaw pain.  Pt was sent in from the nursing home bc according to the EMS report she mentioned dental pain.  Pt has dementia and does not know why she is here in the ED.  Initially she denied any pain, but when I examined her left cheek she said that her left face was hurting.  She is unsure about fevers.  She denies vomiting or diarrhea.  Past Medical History:  Diagnosis Date  . Acute bronchitis 04/10/2013  . Anemia   . Angina   . Anxiety   . Arthritis   . Arthritis 10/02/2012  . Asthma   . Chicken pox   . Chronic kidney disease   . Colon polyps   . Dehydration 08/31/2013  . Dementia   . Depression   . Dysrhythmia   . Headache(784.0)   . Heart murmur   . Hypertension   . Hypothyroidism   . Left leg pain 10/02/2012  . Pneumonia   . Recurrent upper respiratory infection (URI)   . Shortness of breath   . UTI (urinary tract infection) 12/30/2012  . Valvular heart disease 09/04/2012    Patient Active Problem List   Diagnosis Date Noted  . Pressure ulcer 09/20/2015  . Displaced fracture of right femoral neck (HCC) 09/19/2015  . Altered mental status   . Confusion 06/26/2015  . Fall   . Aspiration pneumonia (HCC) 01/10/2015  . Acute encephalopathy 01/08/2015  . UTI (urinary tract infection) 01/08/2015  . 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 Recurrent falls 11/27/2014  . FTT (failure to thrive) in adult 10/26/2014  . Dehydration 10/26/2014  . Abnormal urinalysis 10/26/2014  . Normocytic anemia 10/26/2014  . Lump of skin of left upper extremity 09/07/2014  . Rash  and nonspecific skin eruption 05/08/2014  . Syncope and collapse 11/30/2013  . Low back pain, episodic 11/20/2013  . Chronic kidney disease   . Insomnia 08/31/2013  . Arthritis 10/02/2012  . Valvular heart disease 09/04/2012  . Restless leg syndrome 06/06/2012  . Trochanteric bursitis 08/02/2011  . Dementia 08/02/2011  . Leg pain 03/28/2011  . Hypertension 09/22/2010  . Hypothyroidism 09/22/2010    Past Surgical History:  Procedure Laterality Date  . ABDOMINAL HYSTERECTOMY    . APPENDECTOMY    . BACK SURGERY    . CHOLECYSTECTOMY    . COLONOSCOPY W/ POLYPECTOMY    . EYE SURGERY     cataract removed and eye lids lifted  . feet surgery    . FRACTURE SURGERY     bilateral arms  . HAND SURGERY    . HIP ARTHROPLASTY Right 09/20/2015   Procedure: ARTHROPLASTY RIGHT  BIPOLAR ANTERIOR HIP (HEMIARTHROPLASTY);  Surgeon: Samson Frederic, MD;  Location: [facility] ORS;  Service: Orthopedics;  Laterality: Right;  . kidney stones    . SHOULDER ARTHROSCOPY    . TONSILLECTOMY    . TONSILLECTOMY    . TUBAL LIGATION      OB History    No data available  Home Medications    Prior to Admission medications   Medication Sig Start Date End Date Taking? Authorizing Provider  acetaminophen (TYLENOL) 500 MG tablet Take 500 mg by mouth every 4 (four) hours as needed for mild pain, moderate pain, fever or headache.    Yes Historical Provider, MD  alum & mag hydroxide-simeth (MINTOX) 200-200-20 MG/5ML suspension Take 30 mLs by mouth every 6 (six) hours as needed for indigestion or heartburn.    Yes Historical Provider, MD  aspirin 81 MG chewable tablet Chew 81 mg by mouth daily.   Yes Historical Provider, MD  clonazePAM (KLONOPIN) 0.5 MG tablet Take 1 tablet (0.5 mg total) by mouth 2 (two) times daily. Patient taking differently: Take 0.25-0.5 mg by mouth See admin instructions. Take 0.5 bid daily at 0800 and 2000. Take 0.25 daily at1400 09/23/15  Yes Elease Etienne, MD  divalproex (DEPAKOTE  SPRINKLE) 125 MG capsule Take 125 mg by mouth 3 (three) times daily.    Yes Historical Provider, MD  escitalopram (LEXAPRO) 5 MG tablet Take 5 mg by mouth daily.   Yes Historical Provider, MD  guaifenesin (ROBITUSSIN) 100 MG/5ML syrup Take 200 mg by mouth every 6 (six) hours as needed for cough.    Yes Historical Provider, MD  levothyroxine (SYNTHROID, LEVOTHROID) 50 MCG tablet Take 50 mcg by mouth daily before breakfast.   Yes Historical Provider, MD  loperamide (IMODIUM) 2 MG capsule Take 2 mg by mouth as needed for diarrhea or loose stools.    Yes Historical Provider, MD  magnesium hydroxide (MILK OF MAGNESIA) 400 MG/5ML suspension Take 30 mLs by mouth at bedtime as needed for mild constipation.   Yes Historical Provider, MD  Multiple Vitamins-Minerals (MULTIVITAMIN WITH MINERALS) tablet Take 1 tablet by mouth daily.   Yes Historical Provider, MD  Neomycin-Bacitracin-Polymyxin (TRIPLE ANTIBIOTIC) [phone] OINT Apply topically as needed (for Skin tear and abrasions).   Yes Historical Provider, MD  Nutritional Supplements (NUTRITIONAL DRINK) LIQD Take 1 Bottle by mouth 3 (three) times daily with meals. Mighty Shakes   Yes Historical Provider, MD  rivastigmine (EXELON) 1.5 MG capsule Take 1.5 mg by mouth 2 (two) times daily.   Yes Historical Provider, MD  senna (SENOKOT) 8.6 MG TABS tablet Take 2 tablets by mouth 2 (two) times daily.   Yes Historical Provider, MD  Skin Protectants, Misc. (BAZA PROTECT EX) Apply 1 application topically as needed (after each diaper change).   Yes Historical Provider, MD  traMADol (ULTRAM) 50 MG tablet Take 1 tablet (50 mg total) by mouth every 6 (six) hours as needed for severe pain. 09/23/15  Yes Elease Etienne, MD  traMADol (ULTRAM) 50 MG tablet Take 50 mg by mouth 2 (two) times daily.   Yes Historical Provider, MD  traZODone (DESYREL) 50 MG tablet Take 50 mg by mouth at bedtime.    Yes Historical Provider, MD  amoxicillin (AMOXIL) 500 MG capsule Take 1 capsule  (500 mg total) by mouth 3 (three) times daily. 06/27/16   Linwood Dibbles, MD  cephALEXin (KEFLEX) 500 MG capsule Take 1 capsule (500 mg total) by mouth 4 (four) times daily. Patient not taking: Reported on 06/22/2016 04/25/16   Chase Picket Ward, PA-C    Family History Family History  Problem Relation Age of Onset  . Heart disease Mother   . Heart disease Father   . Heart disease Other   . Birth defects Other     Social History Social History  Substance Use Topics  . Smoking status:  Former Smoker    Packs/day: 0.20    Years: 1.00    Types: Cigarettes    Quit date: 06/02/1941  . Smokeless tobacco: Never Used  . Alcohol use No     Allergies   Morphine and related; Codeine; and Hydrocodone   Review of Systems Review of Systems  All other systems reviewed and are negative.    Physical Exam Updated Vital Signs BP (!) 154/51 (BP Location: Right Arm)   Pulse 63   Temp 98.4 F (36.9 C) (Oral)   Resp 16   Ht 5\' 6"  (1.676 m)   Wt 81.6 kg   SpO2 98%   BMI 29.05 kg/m   Physical Exam  Constitutional: No distress.  HENT:  Head: Normocephalic.  Right Ear: External ear normal.  Left Ear: External ear normal.  Mild Erythema the left maxillary region, tenderness palpation; patient has dentures in the upper mouth, will attempt to remove her dentures and re-examine  Eyes: Conjunctivae are normal. Right eye exhibits no discharge. Left eye exhibits no discharge. No scleral icterus.  Neck: Neck supple. No tracheal deviation present.  Cardiovascular: Normal rate, regular rhythm and intact distal pulses.   Pulmonary/Chest: Effort normal and breath sounds normal. No stridor. No respiratory distress. She has no wheezes. She has no rales.  Abdominal: Soft. Bowel sounds are normal. She exhibits no distension. There is no tenderness. There is no rebound and no guarding.  Musculoskeletal: She exhibits no edema or tenderness.  Neurological: She is alert. She has normal strength. No cranial  nerve deficit (no facial droop, extraocular movements intact, no slurred speech) or sensory deficit. She exhibits normal muscle tone. She displays no seizure activity. Coordination normal.  Skin: Skin is warm and dry. No rash noted.  Psychiatric: She has a normal mood and affect.  Nursing note and vitals reviewed.    ED Treatments / Results  Labs (all labs ordered are listed, but only abnormal results are displayed) Labs Reviewed  CBC WITH DIFFERENTIAL/PLATELET - Abnormal; Notable for the following:       Result Value   WBC 11.1 (*)    Neutro Abs 8.5 (*)    All other components within normal limits  BASIC METABOLIC PANEL - Abnormal; Notable for the following:    Glucose, Bld 137 (*)    GFR calc non Af Amer 52 (*)    All other components within normal limits   Procedures Procedures (including critical care time)  Medications Ordered in ED Medications  sodium chloride 0.9 % bolus 500 mL (500 mLs Intravenous New Bag/Given 06/26/16 2246)  cefTRIAXone (ROCEPHIN) 1 g in dextrose 5 % 50 mL IVPB (1 g Intravenous New Bag/Given 06/26/16 2248)     Initial Impression / Assessment and Plan / ED Course  I have reviewed the triage vital signs and the nursing notes.  Pertinent labs & imaging results that were available during my care of the patient were reviewed by me and considered in my medical decision making (see chart for details).  Clinical Course as of Jun 28 15  Fri Jun 27, 2016  0009 Pt re examined once her upper dentures removed.  Small wound with ttp at the junction of the gingival and buccal mucosa at around where her left canine would be, ttp  [JK]    Clinical Course User Index [JK] Linwood Dibbles, MD   Pt has no teeth in her maxillary region.   She does have a wound in her mouth.  Suspect this is the source  of infection.  Only mild erythema noted at this point.  Will rx antibiotics.  Have her follow up with her PCP.  Avoid using her dentures in the short term  Final Clinical  Impressions(s) / ED Diagnoses   Final diagnoses:  Oral ulceration  Cellulitis of face    New Prescriptions New Prescriptions   AMOXICILLIN (AMOXIL) 500 MG CAPSULE    Take 1 capsule (500 mg total) by mouth 3 (three) times daily.     Linwood DibblesJon Rasheed Welty, MD 06/27/16 [phone]

## 2016-06-26 NOTE — ED Triage Notes (Signed)
Pt with a history of dementia comes in for evaluation of ongoing jaw pain, as per facility pt spit up some blood tonight

## 2016-06-27 DIAGNOSIS — R6884 Jaw pain: Secondary | ICD-10-CM | POA: Diagnosis not present

## 2016-06-27 DIAGNOSIS — G0430 Acute necrotizing hemorrhagic encephalopathy, unspecified: Secondary | ICD-10-CM | POA: Diagnosis not present

## 2016-06-27 DIAGNOSIS — R531 Weakness: Secondary | ICD-10-CM | POA: Diagnosis not present

## 2016-06-27 MED ORDER — AMOXICILLIN 500 MG PO CAPS: 500.0000 mg | ORAL_CAPSULE | Freq: Three times a day (TID) | ORAL | 0 refills | Status: DC

## 2016-06-27 NOTE — Discharge Instructions (Signed)
Follow-up with a dentist to reexamine the ulceration noted in your mouth, avoid using new dentures for the next week,  make sure to drink water or to rinse her mouth out after eating, return as needed for worsening symptoms , fevers.

## 2016-06-27 NOTE — ED Notes (Addendum)
[facility]. Pt departed with PTAR and in NAD. Pt unable to sign for d/c order d/t severe dementia.

## 2016-06-28 DIAGNOSIS — G0430 Acute necrotizing hemorrhagic encephalopathy, unspecified: Secondary | ICD-10-CM | POA: Diagnosis not present

## 2016-06-29 DIAGNOSIS — G0430 Acute necrotizing hemorrhagic encephalopathy, unspecified: Secondary | ICD-10-CM | POA: Diagnosis not present

## 2016-06-30 DIAGNOSIS — G0430 Acute necrotizing hemorrhagic encephalopathy, unspecified: Secondary | ICD-10-CM | POA: Diagnosis not present

## 2016-06-30 DIAGNOSIS — K120 Recurrent oral aphthae: Secondary | ICD-10-CM | POA: Diagnosis not present

## 2016-06-30 DIAGNOSIS — G309 Alzheimer's disease, unspecified: Secondary | ICD-10-CM | POA: Diagnosis not present

## 2016-06-30 DIAGNOSIS — F0281 Dementia in other diseases classified elsewhere with behavioral disturbance: Secondary | ICD-10-CM | POA: Diagnosis not present

## 2016-07-01 DIAGNOSIS — G0430 Acute necrotizing hemorrhagic encephalopathy, unspecified: Secondary | ICD-10-CM | POA: Diagnosis not present

## 2016-07-02 DIAGNOSIS — G0430 Acute necrotizing hemorrhagic encephalopathy, unspecified: Secondary | ICD-10-CM | POA: Diagnosis not present

## 2016-07-03 DIAGNOSIS — G0430 Acute necrotizing hemorrhagic encephalopathy, unspecified: Secondary | ICD-10-CM | POA: Diagnosis not present

## 2016-07-04 DIAGNOSIS — R32 Unspecified urinary incontinence: Secondary | ICD-10-CM | POA: Diagnosis not present

## 2016-07-04 DIAGNOSIS — G0430 Acute necrotizing hemorrhagic encephalopathy, unspecified: Secondary | ICD-10-CM | POA: Diagnosis not present

## 2016-07-05 DIAGNOSIS — G0430 Acute necrotizing hemorrhagic encephalopathy, unspecified: Secondary | ICD-10-CM | POA: Diagnosis not present

## 2016-07-06 DIAGNOSIS — G0430 Acute necrotizing hemorrhagic encephalopathy, unspecified: Secondary | ICD-10-CM | POA: Diagnosis not present

## 2016-07-07 DIAGNOSIS — G309 Alzheimer's disease, unspecified: Secondary | ICD-10-CM | POA: Diagnosis not present

## 2016-07-07 DIAGNOSIS — G0430 Acute necrotizing hemorrhagic encephalopathy, unspecified: Secondary | ICD-10-CM | POA: Diagnosis not present

## 2016-07-07 DIAGNOSIS — F0281 Dementia in other diseases classified elsewhere with behavioral disturbance: Secondary | ICD-10-CM | POA: Diagnosis not present

## 2016-07-07 DIAGNOSIS — R296 Repeated falls: Secondary | ICD-10-CM | POA: Diagnosis not present

## 2016-07-08 DIAGNOSIS — G0430 Acute necrotizing hemorrhagic encephalopathy, unspecified: Secondary | ICD-10-CM | POA: Diagnosis not present

## 2016-07-09 DIAGNOSIS — G0430 Acute necrotizing hemorrhagic encephalopathy, unspecified: Secondary | ICD-10-CM | POA: Diagnosis not present

## 2016-07-09 DIAGNOSIS — N390 Urinary tract infection, site not specified: Secondary | ICD-10-CM | POA: Diagnosis not present

## 2016-07-09 DIAGNOSIS — Z5181 Encounter for therapeutic drug level monitoring: Secondary | ICD-10-CM | POA: Diagnosis not present

## 2016-07-10 DIAGNOSIS — G0430 Acute necrotizing hemorrhagic encephalopathy, unspecified: Secondary | ICD-10-CM | POA: Diagnosis not present

## 2016-07-11 DIAGNOSIS — G0430 Acute necrotizing hemorrhagic encephalopathy, unspecified: Secondary | ICD-10-CM | POA: Diagnosis not present

## 2016-07-12 DIAGNOSIS — G0430 Acute necrotizing hemorrhagic encephalopathy, unspecified: Secondary | ICD-10-CM | POA: Diagnosis not present

## 2016-07-13 DIAGNOSIS — G0430 Acute necrotizing hemorrhagic encephalopathy, unspecified: Secondary | ICD-10-CM | POA: Diagnosis not present

## 2016-07-14 DIAGNOSIS — G0430 Acute necrotizing hemorrhagic encephalopathy, unspecified: Secondary | ICD-10-CM | POA: Diagnosis not present

## 2016-07-15 DIAGNOSIS — G0430 Acute necrotizing hemorrhagic encephalopathy, unspecified: Secondary | ICD-10-CM | POA: Diagnosis not present

## 2016-07-15 DIAGNOSIS — F0281 Dementia in other diseases classified elsewhere with behavioral disturbance: Secondary | ICD-10-CM | POA: Diagnosis not present

## 2016-07-15 DIAGNOSIS — F411 Generalized anxiety disorder: Secondary | ICD-10-CM | POA: Diagnosis not present

## 2016-07-16 DIAGNOSIS — G0430 Acute necrotizing hemorrhagic encephalopathy, unspecified: Secondary | ICD-10-CM | POA: Diagnosis not present

## 2016-07-17 DIAGNOSIS — G0430 Acute necrotizing hemorrhagic encephalopathy, unspecified: Secondary | ICD-10-CM | POA: Diagnosis not present

## 2016-07-18 DIAGNOSIS — G0430 Acute necrotizing hemorrhagic encephalopathy, unspecified: Secondary | ICD-10-CM | POA: Diagnosis not present

## 2016-07-19 DIAGNOSIS — G0430 Acute necrotizing hemorrhagic encephalopathy, unspecified: Secondary | ICD-10-CM | POA: Diagnosis not present

## 2016-07-20 DIAGNOSIS — G0430 Acute necrotizing hemorrhagic encephalopathy, unspecified: Secondary | ICD-10-CM | POA: Diagnosis not present

## 2016-07-21 DIAGNOSIS — G0430 Acute necrotizing hemorrhagic encephalopathy, unspecified: Secondary | ICD-10-CM | POA: Diagnosis not present

## 2016-07-22 DIAGNOSIS — G0430 Acute necrotizing hemorrhagic encephalopathy, unspecified: Secondary | ICD-10-CM | POA: Diagnosis not present

## 2016-07-23 DIAGNOSIS — G0430 Acute necrotizing hemorrhagic encephalopathy, unspecified: Secondary | ICD-10-CM | POA: Diagnosis not present

## 2016-07-24 DIAGNOSIS — G0430 Acute necrotizing hemorrhagic encephalopathy, unspecified: Secondary | ICD-10-CM | POA: Diagnosis not present

## 2016-07-25 DIAGNOSIS — G0430 Acute necrotizing hemorrhagic encephalopathy, unspecified: Secondary | ICD-10-CM | POA: Diagnosis not present

## 2016-07-26 DIAGNOSIS — G0430 Acute necrotizing hemorrhagic encephalopathy, unspecified: Secondary | ICD-10-CM | POA: Diagnosis not present

## 2016-07-27 DIAGNOSIS — G0430 Acute necrotizing hemorrhagic encephalopathy, unspecified: Secondary | ICD-10-CM | POA: Diagnosis not present

## 2016-07-28 DIAGNOSIS — G0430 Acute necrotizing hemorrhagic encephalopathy, unspecified: Secondary | ICD-10-CM | POA: Diagnosis not present

## 2016-07-29 DIAGNOSIS — G0430 Acute necrotizing hemorrhagic encephalopathy, unspecified: Secondary | ICD-10-CM | POA: Diagnosis not present

## 2016-07-30 DIAGNOSIS — G0430 Acute necrotizing hemorrhagic encephalopathy, unspecified: Secondary | ICD-10-CM | POA: Diagnosis not present

## 2016-07-31 DIAGNOSIS — G0430 Acute necrotizing hemorrhagic encephalopathy, unspecified: Secondary | ICD-10-CM | POA: Diagnosis not present

## 2016-08-01 DIAGNOSIS — G0430 Acute necrotizing hemorrhagic encephalopathy, unspecified: Secondary | ICD-10-CM | POA: Diagnosis not present

## 2016-08-02 DIAGNOSIS — G0430 Acute necrotizing hemorrhagic encephalopathy, unspecified: Secondary | ICD-10-CM | POA: Diagnosis not present

## 2016-08-02 IMAGING — CT CT CERVICAL SPINE W/O CM
3 of 9 series · 10 of 33 positions shown, 12 images · non-contrast
Comparison: CT of the head and cervical spine performed 03/22/2015

CLINICAL DATA: Status post fall.  Confusion.  Initial encounter.

EXAM:
CT HEAD WITHOUT CONTRAST
CT CERVICAL SPINE WITHOUT CONTRAST
TECHNIQUE: Multidetector CT imaging of the head and cervical spine was
performed following the standard protocol without intravenous
contrast. Multiplanar CT image reconstructions of the cervical spine
were also generated.

[Series 5: c_spine 2.0 st · axial · 0.34mm/px · z∈[-309,-139]mm · 3 of 86 slices shown, 4 images]
[im 1/86  soft-tissue]
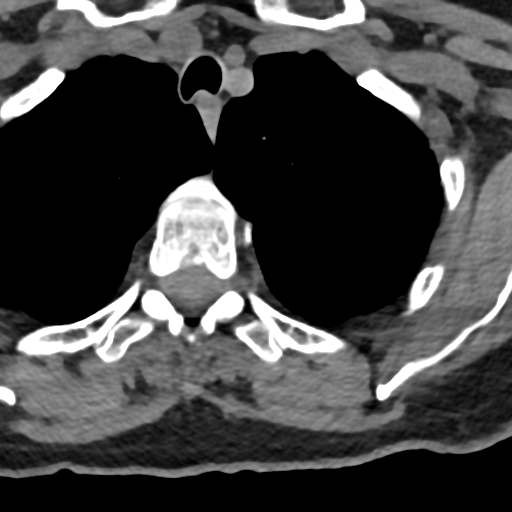
[im 1/86  bone]
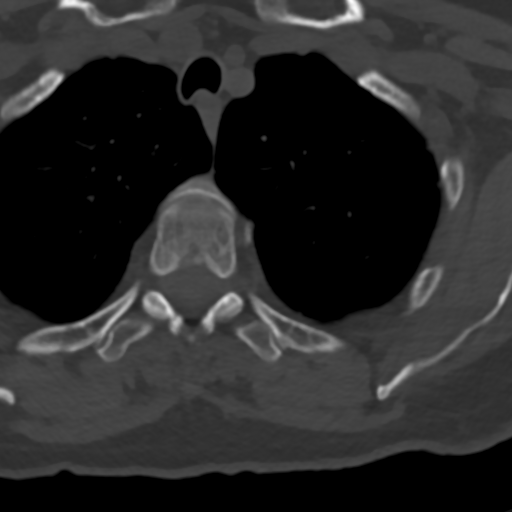
[im 43/86  bone]
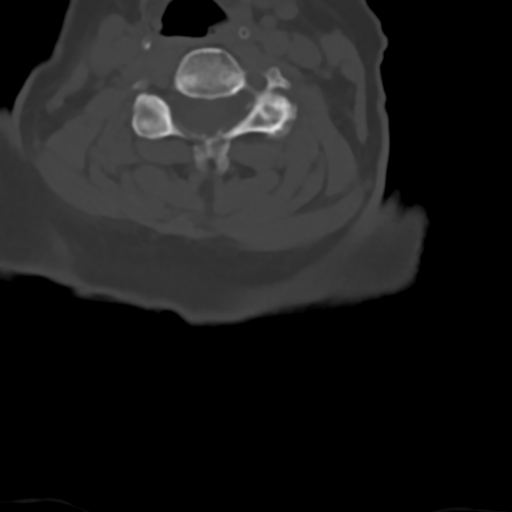
[im 86/86  bone]
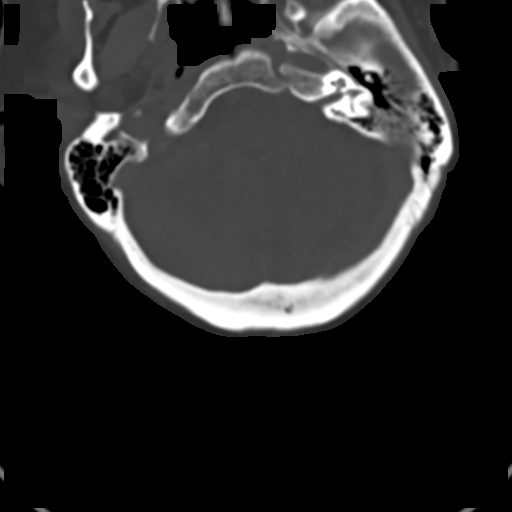

[Series 7: c_spine 2.0 sag bone · sagittal · 0.33mm/px · 5 of 56 slices shown, 6 images]
[im 19/56  bone]
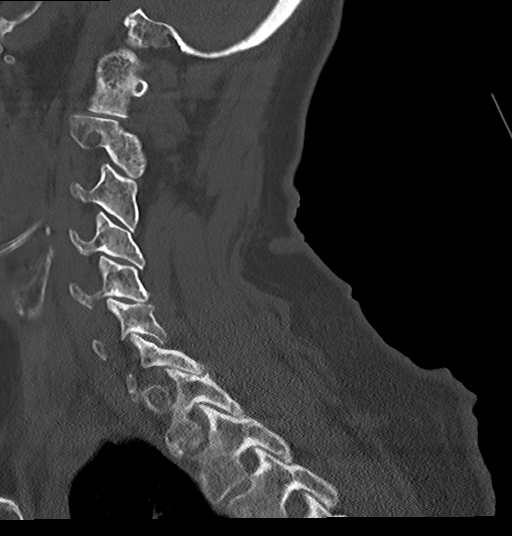
[im 23/56  bone]
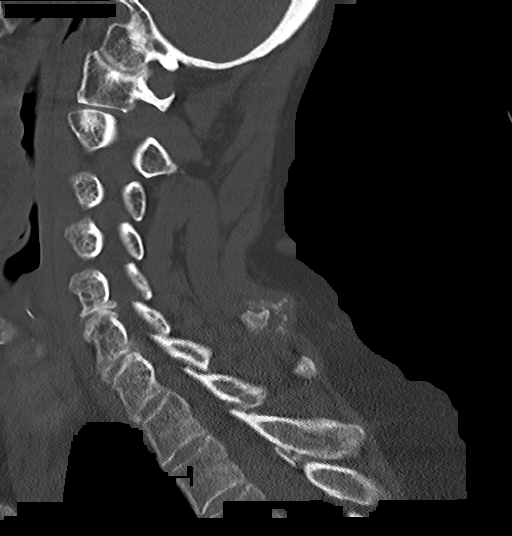
[im 28/56  soft-tissue]
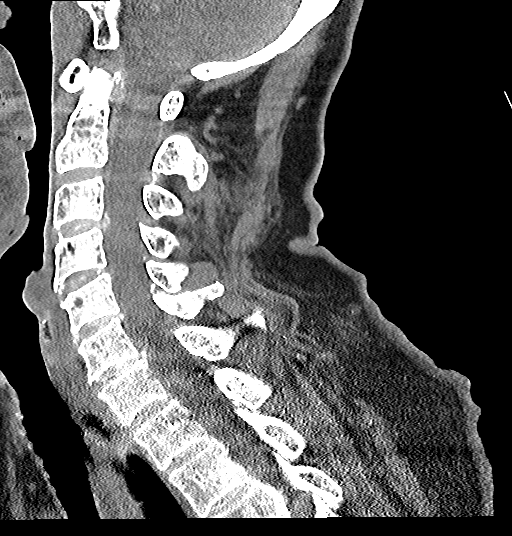
[im 28/56  bone]
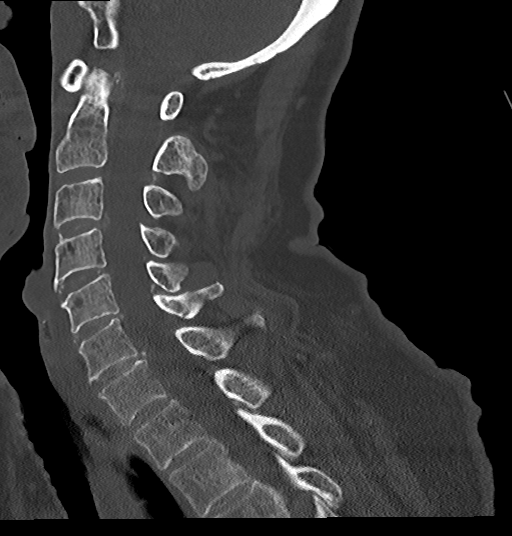
[im 33/56  bone]
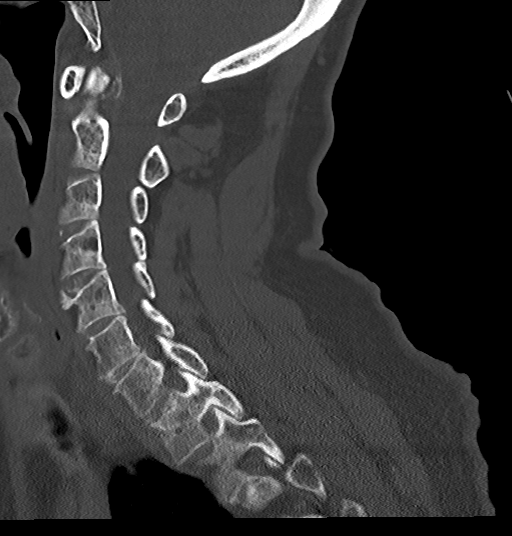
[im 37/56  bone]
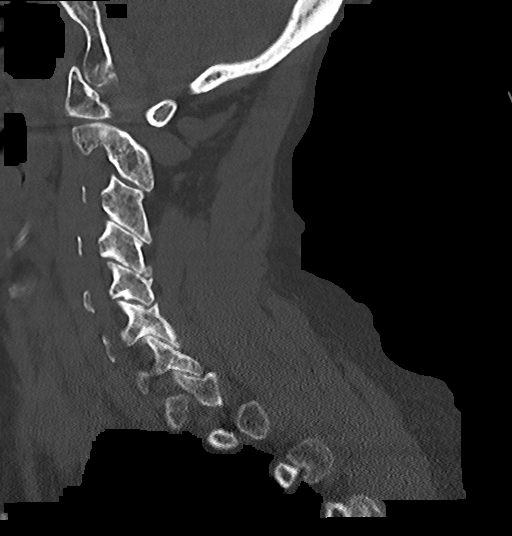

[Series 8: c_spine 2.0 cor bone · coronal · 0.24mm/px · 2 of 53 slices shown]
[im 18/53  bone]
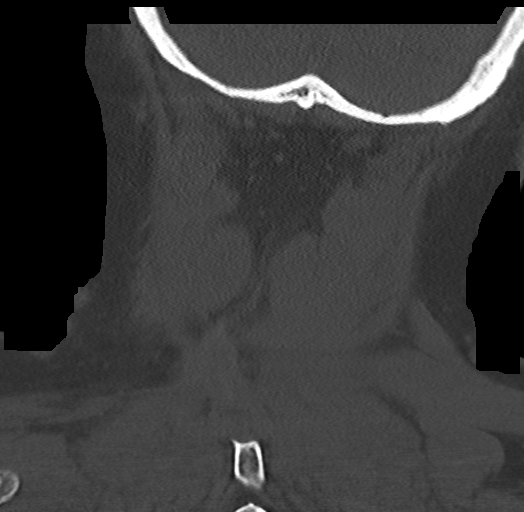
[im 35/53  bone]
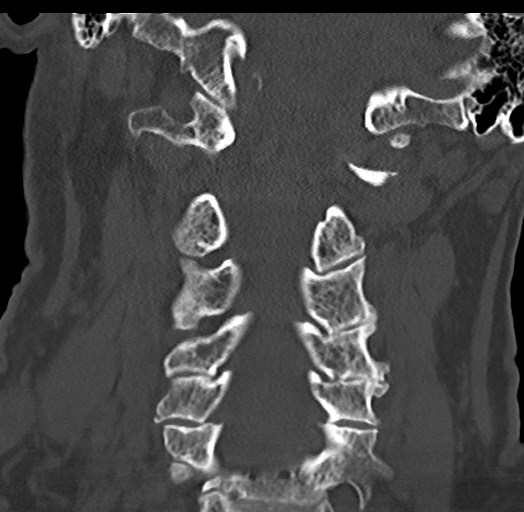

[10 of 33 positions shown; findings below may reference images not displayed]

FINDINGS: CT HEAD FINDINGS

There is no evidence of acute infarction, mass lesion, or intra- or
extra-axial hemorrhage on CT.

Prominence of the ventricles and sulci reflects mild to moderate
cortical volume loss. Mild cerebellar atrophy is noted. Scattered
periventricular white matter change likely reflects small vessel
ischemic microangiopathy.

The brainstem and fourth ventricle are within normal limits. The
basal ganglia are unremarkable in appearance. The cerebral
hemispheres demonstrate grossly normal gray-white differentiation.
No mass effect or midline shift is seen.

There is no evidence of fracture; visualized osseous structures are
unremarkable in appearance. The visualized portions of the orbits
are within normal limits. The paranasal sinuses and mastoid air
cells are well-aerated. No significant soft tissue abnormalities are
seen.

CT CERVICAL SPINE FINDINGS

There is no evidence of fracture or subluxation. Vertebral bodies
demonstrate normal height and alignment. Small anterior and
posterior disc osteophyte complexes are noted along the cervical
spine. Intervertebral disc spaces are preserved. Prevertebral soft
tissues are within normal limits.

The thyroid gland is unremarkable in appearance. The visualized lung
apices are clear. Mild calcification is noted at the carotid
bifurcations bilaterally.
IMPRESSION: 1. No evidence of traumatic intracranial injury or fracture.
2. No evidence of fracture or subluxation along the cervical spine.
3. Mild to moderate cortical volume loss and scattered small vessel
ischemic microangiopathy.
4. Minimal degenerative change noted along the cervical spine.
5. Mild calcification noted at the carotid bifurcations bilaterally.

## 2016-08-03 DIAGNOSIS — G0430 Acute necrotizing hemorrhagic encephalopathy, unspecified: Secondary | ICD-10-CM | POA: Diagnosis not present

## 2016-08-04 DIAGNOSIS — G0430 Acute necrotizing hemorrhagic encephalopathy, unspecified: Secondary | ICD-10-CM | POA: Diagnosis not present

## 2016-08-05 DIAGNOSIS — G0430 Acute necrotizing hemorrhagic encephalopathy, unspecified: Secondary | ICD-10-CM | POA: Diagnosis not present

## 2016-08-05 DIAGNOSIS — R32 Unspecified urinary incontinence: Secondary | ICD-10-CM | POA: Diagnosis not present

## 2016-08-06 DIAGNOSIS — G0430 Acute necrotizing hemorrhagic encephalopathy, unspecified: Secondary | ICD-10-CM | POA: Diagnosis not present

## 2016-08-07 DIAGNOSIS — G0430 Acute necrotizing hemorrhagic encephalopathy, unspecified: Secondary | ICD-10-CM | POA: Diagnosis not present

## 2016-08-08 ENCOUNTER — Emergency Department (HOSPITAL_COMMUNITY): Payer: Medicare HMO

## 2016-08-08 ENCOUNTER — Emergency Department (HOSPITAL_COMMUNITY)
Admission: EM | Admit: 2016-08-08 | Discharge: 2016-08-09 | Disposition: A | Discharge status: Home / Self Care | Payer: Medicare HMO | Attending: Emergency Medicine | Admitting: Emergency Medicine

## 2016-08-08 ENCOUNTER — Encounter (HOSPITAL_COMMUNITY): Payer: Self-pay

## 2016-08-08 DIAGNOSIS — G309 Alzheimer's disease, unspecified: Secondary | ICD-10-CM | POA: Insufficient documentation

## 2016-08-08 DIAGNOSIS — T148XXA Other injury of unspecified body region, initial encounter: Secondary | ICD-10-CM | POA: Diagnosis not present

## 2016-08-08 DIAGNOSIS — Y92129 Unspecified place in nursing home as the place of occurrence of the external cause: Secondary | ICD-10-CM | POA: Diagnosis not present

## 2016-08-08 DIAGNOSIS — N189 Chronic kidney disease, unspecified: Secondary | ICD-10-CM | POA: Diagnosis not present

## 2016-08-08 DIAGNOSIS — S7002XA Contusion of left hip, initial encounter: Secondary | ICD-10-CM | POA: Diagnosis not present

## 2016-08-08 DIAGNOSIS — W19XXXA Unspecified fall, initial encounter: Secondary | ICD-10-CM

## 2016-08-08 DIAGNOSIS — R102 Pelvic and perineal pain: Secondary | ICD-10-CM | POA: Diagnosis not present

## 2016-08-08 DIAGNOSIS — M5489 Other dorsalgia: Secondary | ICD-10-CM | POA: Diagnosis not present

## 2016-08-08 DIAGNOSIS — S79912A Unspecified injury of left hip, initial encounter: Secondary | ICD-10-CM | POA: Diagnosis present

## 2016-08-08 DIAGNOSIS — Z79899 Other long term (current) drug therapy: Secondary | ICD-10-CM | POA: Insufficient documentation

## 2016-08-08 DIAGNOSIS — R51 Headache: Secondary | ICD-10-CM | POA: Diagnosis not present

## 2016-08-08 DIAGNOSIS — Z96641 Presence of right artificial hip joint: Secondary | ICD-10-CM | POA: Diagnosis not present

## 2016-08-08 DIAGNOSIS — E039 Hypothyroidism, unspecified: Secondary | ICD-10-CM | POA: Insufficient documentation

## 2016-08-08 DIAGNOSIS — I129 Hypertensive chronic kidney disease with stage 1 through stage 4 chronic kidney disease, or unspecified chronic kidney disease: Secondary | ICD-10-CM | POA: Diagnosis not present

## 2016-08-08 DIAGNOSIS — Z87891 Personal history of nicotine dependence: Secondary | ICD-10-CM | POA: Insufficient documentation

## 2016-08-08 DIAGNOSIS — J45909 Unspecified asthma, uncomplicated: Secondary | ICD-10-CM | POA: Insufficient documentation

## 2016-08-08 DIAGNOSIS — Z7982 Long term (current) use of aspirin: Secondary | ICD-10-CM | POA: Diagnosis not present

## 2016-08-08 DIAGNOSIS — Y939 Activity, unspecified: Secondary | ICD-10-CM | POA: Diagnosis not present

## 2016-08-08 DIAGNOSIS — F0281 Dementia in other diseases classified elsewhere with behavioral disturbance: Secondary | ICD-10-CM | POA: Insufficient documentation

## 2016-08-08 DIAGNOSIS — W1830XA Fall on same level, unspecified, initial encounter: Secondary | ICD-10-CM | POA: Diagnosis not present

## 2016-08-08 DIAGNOSIS — M542 Cervicalgia: Secondary | ICD-10-CM | POA: Diagnosis not present

## 2016-08-08 DIAGNOSIS — Y999 Unspecified external cause status: Secondary | ICD-10-CM | POA: Insufficient documentation

## 2016-08-08 NOTE — ED Triage Notes (Signed)
Per EMS: Pt at [facility]. Pt had unwiitnessed fall. Pt found on L side. Pt complaining of mid lower back pain. Pt alert to painful stimuli. Pt at baseline per facility. NAD noted.

## 2016-08-08 NOTE — ED Notes (Signed)
[facility] contacted for tx back to [facility] 

## 2016-08-09 ENCOUNTER — Emergency Department (HOSPITAL_COMMUNITY): Payer: Medicare HMO

## 2016-08-09 DIAGNOSIS — G8911 Acute pain due to trauma: Secondary | ICD-10-CM | POA: Diagnosis not present

## 2016-08-09 DIAGNOSIS — M542 Cervicalgia: Secondary | ICD-10-CM | POA: Diagnosis not present

## 2016-08-09 DIAGNOSIS — S7002XA Contusion of left hip, initial encounter: Secondary | ICD-10-CM | POA: Diagnosis not present

## 2016-08-09 DIAGNOSIS — R4182 Altered mental status, unspecified: Secondary | ICD-10-CM | POA: Diagnosis not present

## 2016-08-09 DIAGNOSIS — R51 Headache: Secondary | ICD-10-CM | POA: Diagnosis not present

## 2016-08-09 DIAGNOSIS — G0430 Acute necrotizing hemorrhagic encephalopathy, unspecified: Secondary | ICD-10-CM | POA: Diagnosis not present

## 2016-08-09 NOTE — ED Notes (Signed)
MD at bedside. 

## 2016-08-09 NOTE — ED Provider Notes (Signed)
Called to the room for patient's fall. Patient had been discharged by previous physician after evaluation. While waiting for EMS transport, patient attempted to get out of bed and fell. Fall was unwitnessed. She was found on the ground complaining of pain in her neck. She also has an obvious contusion of the forehead. Patient has baseline dementia, cannot provide any information about the fall. Patient was sent to radiology for CT head and cervical spine. No acute abnormalities were seen. Patient felt safe for discharge.   Gilda Creasehristopher J Jerica Creegan, MD 08/09/16 [phone]

## 2016-08-09 NOTE — ED Notes (Addendum)
RN called to bedside, informed by [name], RN that pt had fallen. Pt in bed upon RN arrival.  Pt with hematoma to L forehead. Pt placed in C-collar by fellow RN. Pt A/o to self at baseline. No additional complaints noted.

## 2016-08-09 NOTE — ED Notes (Signed)
This RN was in POD C with her patients when she overheard the phlebotomist De NurseKaren Matthews say "shes on the floor." So this RN went asap to D34 and found the patient in room D34 supine on her back, no shirt on and had a hematoma noted to the left side of her forehead. Pt escorted back to bed by this RN, De NurseKaren Matthews and Prom, NT. Pt did complain of neck stiffness so C-collar applied while maintaining stabilization of c-spine. Dr. Blinda LeatherwoodPollina immediately informed as well as the primary RN Conan BowensGabriel Montague and also the charge RN Loel LoftyWoody M.

## 2016-08-09 NOTE — ED Provider Notes (Signed)
[facility]-EMERGENCY DEPT Provider Note   CSN: [id number] Arrival date & time: 08/08/16  1943     History   Chief Complaint Chief Complaint  Patient presents with  . Fall  . Dementia    HPI [name] is a 83 y.o. female.  The history is provided by the EMS personnel and the nursing home.  Fall  This is a new problem. Episode onset: unknown, found in floor by staff. The problem occurs constantly. The problem has not changed since onset.Pertinent negatives include no chest pain, no abdominal pain and no headaches. Nothing aggravates the symptoms. Nothing relieves the symptoms. She has tried nothing for the symptoms.    Past Medical History:  Diagnosis Date  . Acute bronchitis 04/10/2013  . Anemia   . Angina   . Anxiety   . Arthritis   . Arthritis 10/02/2012  . Asthma   . Chicken pox   . Chronic kidney disease   . Colon polyps   . Dehydration 08/31/2013  . Dementia   . Depression   . Dysrhythmia   . Headache(784.0)   . Heart murmur   . Hypertension   . Hypothyroidism   . Left leg pain 10/02/2012  . Pneumonia   . Recurrent upper respiratory infection (URI)   . Shortness of breath   . UTI (urinary tract infection) 12/30/2012  . Valvular heart disease 09/04/2012    Patient Active Problem List   Diagnosis Date Noted  . Pressure ulcer 09/20/2015  . Displaced fracture of right femoral neck (HCC) 09/19/2015  . Altered mental status   . Confusion 06/26/2015  . Fall   . Aspiration pneumonia (HCC) 01/10/2015  . Acute encephalopathy 01/08/2015  . UTI (urinary tract infection) 01/08/2015  . 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 Recurrent falls 11/27/2014  . FTT (failure to thrive) in adult 10/26/2014  . Dehydration 10/26/2014  . Abnormal urinalysis 10/26/2014  . Normocytic anemia 10/26/2014  . Lump of skin of left upper extremity 09/07/2014  . Rash and nonspecific skin eruption 05/08/2014  . Syncope and collapse 11/30/2013  . Low back pain, episodic  11/20/2013  . Chronic kidney disease   . Insomnia 08/31/2013  . Arthritis 10/02/2012  . Valvular heart disease 09/04/2012  . Restless leg syndrome 06/06/2012  . Trochanteric bursitis 08/02/2011  . Dementia 08/02/2011  . Leg pain 03/28/2011  . Hypertension 09/22/2010  . Hypothyroidism 09/22/2010    Past Surgical History:  Procedure Laterality Date  . ABDOMINAL HYSTERECTOMY    . APPENDECTOMY    . BACK SURGERY    . CHOLECYSTECTOMY    . COLONOSCOPY W/ POLYPECTOMY    . EYE SURGERY     cataract removed and eye lids lifted  . feet surgery    . FRACTURE SURGERY     bilateral arms  . HAND SURGERY    . HIP ARTHROPLASTY Right 09/20/2015   Procedure: ARTHROPLASTY RIGHT  BIPOLAR ANTERIOR HIP (HEMIARTHROPLASTY);  Surgeon: Samson FredericBrian Swinteck, MD;  Location: [facility];  Service: Orthopedics;  Laterality: Right;  . kidney stones    . SHOULDER ARTHROSCOPY    . TONSILLECTOMY    . TONSILLECTOMY    . TUBAL LIGATION      OB History    No data available       Home Medications    Prior to Admission medications   Medication Sig Start Date End Date Taking? Authorizing Provider  acetaminophen (TYLENOL) 500 MG tablet Take 500 mg by mouth See admin instructions. Take  1 caplet by mouth every 4 hours as needed for 24 hours for headache, minor discomfort, fever - do not exceed 2000 mg in 2 hours   Yes Historical Provider, MD  alum & mag hydroxide-simeth (MINTOX) 200-200-20 MG/5ML suspension Take 30 mLs by mouth every 6 (six) hours as needed for indigestion or heartburn.    Yes Historical Provider, MD  aspirin 81 MG chewable tablet Chew 81 mg by mouth daily.   Yes Historical Provider, MD  clonazePAM (KLONOPIN) 0.5 MG tablet Take 1 tablet (0.5 mg total) by mouth 2 (two) times daily. Patient taking differently: Take 0.25-0.5 mg by mouth See admin instructions. Take 1/2 tablet (0.25 mg) by mouth every morning and 1 tablet (0.5 mg) at bedtime 09/23/15  Yes Elease Etienne, MD  divalproex (DEPAKOTE SPRINKLE) 125  MG capsule Take 125 mg by mouth 3 (three) times daily. 8am, 2pm, 8pm   Yes Historical Provider, MD  escitalopram (LEXAPRO) 5 MG tablet Take 5 mg by mouth daily.   Yes Historical Provider, MD  guaifenesin (ROBITUSSIN) 100 MG/5ML syrup Take 200 mg by mouth every 6 (six) hours as needed for cough.    Yes Historical Provider, MD  levothyroxine (SYNTHROID, LEVOTHROID) 50 MCG tablet Take 50 mcg by mouth daily at 6 (six) AM.    Yes Historical Provider, MD  loperamide (IMODIUM) 2 MG capsule Take 2 mg by mouth as needed for diarrhea or loose stools.    Yes Historical Provider, MD  magnesium hydroxide (MILK OF MAGNESIA) 400 MG/5ML suspension Take 30 mLs by mouth at bedtime as needed for mild constipation.   Yes Historical Provider, MD  Multiple Vitamins-Minerals (MULTIVITAMIN WITH MINERALS) tablet Take 1 tablet by mouth daily.   Yes Historical Provider, MD  Neomycin-Bacitracin-Polymyxin (TRIPLE ANTIBIOTIC) [phone] OINT Apply topically as needed (for Skin tear and abrasions).   Yes Historical Provider, MD  Nutritional Supplements (NUTRITIONAL DRINK) LIQD Take 1 Bottle by mouth 3 (three) times daily with meals. Mighty Shakes   Yes Historical Provider, MD  rivastigmine (EXELON) 1.5 MG capsule Take 1.5 mg by mouth 2 (two) times daily.   Yes Historical Provider, MD  senna (SENOKOT) 8.6 MG TABS tablet Take 2 tablets by mouth 2 (two) times daily.   Yes Historical Provider, MD  traMADol (ULTRAM) 50 MG tablet Take 1 tablet (50 mg total) by mouth every 6 (six) hours as needed for severe pain. Patient taking differently: Take 50 mg by mouth See admin instructions. Take 1 tablet (50 mg) by mouth twice daily, may also take 1 tablet every 6 hours as needed for moderate or severe pain 09/23/15  Yes Elease Etienne, MD  traZODone (DESYREL) 50 MG tablet Take 50 mg by mouth at bedtime.    Yes Historical Provider, MD    Family History Family History  Problem Relation Age of Onset  . Heart disease Mother   . Heart  disease Father   . Heart disease Other   . Birth defects Other     Social History Social History  Substance Use Topics  . Smoking status: Former Smoker    Packs/day: 0.20    Years: 1.00    Types: Cigarettes    Quit date: 06/02/1941  . Smokeless tobacco: Never Used  . Alcohol use No     Allergies   Morphine and related; Codeine; and Hydrocodone   Review of Systems Review of Systems  Unable to perform ROS: Dementia  Cardiovascular: Negative for chest pain.  Gastrointestinal: Negative for abdominal pain.  Neurological:  Negative for headaches.  Level 5 exception to history: dementia    Physical Exam Updated Vital Signs BP 152/60   Pulse 72   Resp 16   SpO2 94%   Physical Exam  Constitutional: She appears well-developed and well-nourished. No distress.  HENT:  Head: Normocephalic and atraumatic. Head is without abrasion, without contusion and without laceration.  Nose: Nose normal.  Eyes: Conjunctivae are normal.  Neck: Neck supple. No spinous process tenderness and no muscular tenderness present. No tracheal deviation and normal range of motion present.  Cardiovascular: Normal rate and regular rhythm.   Pulmonary/Chest: Effort normal. No respiratory distress.  Abdominal: Soft. She exhibits no distension.  Musculoskeletal:       Left hip: She exhibits tenderness (lateral left hip). She exhibits normal range of motion and no deformity.  Neurological: She is alert. She has normal strength. She is disoriented (at baseline). No cranial nerve deficit. Coordination normal.  Skin: Skin is warm and dry.  Psychiatric: She has a normal mood and affect.     ED Treatments / Results  Labs (all labs ordered are listed, but only abnormal results are displayed) Labs Reviewed - No data to display  EKG  EKG Interpretation  Date/Time:  Friday August 08 2016 19:52:30 EST Ventricular Rate:  60 PR Interval:    QRS Duration: 164 QT Interval:  472 QTC Calculation: 472 R  Axis:   -56 Text Interpretation:  Sinus rhythm Atrial premature complex Short PR interval RBBB and LAFB No significant change since last tracing Confirmed by Tamlyn Sides MD, Seidy Labreck [phone]) on 08/08/2016 8:22:10 PM       Radiology Dg Pelvis 1-2 Views  Result Date: 08/08/2016 CLINICAL DATA:  Fall with pelvic pain EXAM: PELVIS - 1-2 VIEW COMPARISON:  05/22/2016 FINDINGS: Patient is status post right hip replacement. No dislocation is evident. Bones appear osteopenic. No definite acute displaced fracture. Mild to moderate arthritis left hip. Right greater than left SI joint disease. IMPRESSION: Status post right hip replacement. No acute fracture or malalignment. Electronically Signed   By: Jasmine Pang M.D.   On: 08/08/2016 23:43    Procedures Procedures (including critical care time)  Medications Ordered in ED Medications - No data to display   Initial Impression / Assessment and Plan / ED Course  I have reviewed the triage vital signs and the nursing notes.  Pertinent labs & imaging results that were available during my care of the patient were reviewed by me and considered in my medical decision making (see chart for details).     83 y.o. female presents with unwitnessed fall at nursing facility. No evidence of trauma to scalp, neck nontender. Difficult historian d/t dementia. Mild left hip tenderness. Plain film negative for bony injury and Pt is at baseline. Plan to follow up with PCP as needed and return precautions discussed for worsening or new concerning symptoms.   Final Clinical Impressions(s) / ED Diagnoses   Final diagnoses:  Fall from standing, initial encounter  Contusion of left hip, initial encounter    New Prescriptions New Prescriptions   No medications on file     Lyndal Pulley, MD 08/09/16 0148

## 2016-08-10 DIAGNOSIS — G0430 Acute necrotizing hemorrhagic encephalopathy, unspecified: Secondary | ICD-10-CM | POA: Diagnosis not present

## 2016-08-11 DIAGNOSIS — G0430 Acute necrotizing hemorrhagic encephalopathy, unspecified: Secondary | ICD-10-CM | POA: Diagnosis not present

## 2016-08-12 DIAGNOSIS — F0281 Dementia in other diseases classified elsewhere with behavioral disturbance: Secondary | ICD-10-CM | POA: Diagnosis not present

## 2016-08-12 DIAGNOSIS — R296 Repeated falls: Secondary | ICD-10-CM | POA: Diagnosis not present

## 2016-08-12 DIAGNOSIS — G0430 Acute necrotizing hemorrhagic encephalopathy, unspecified: Secondary | ICD-10-CM | POA: Diagnosis not present

## 2016-08-12 DIAGNOSIS — G309 Alzheimer's disease, unspecified: Secondary | ICD-10-CM | POA: Diagnosis not present

## 2016-08-13 DIAGNOSIS — G0430 Acute necrotizing hemorrhagic encephalopathy, unspecified: Secondary | ICD-10-CM | POA: Diagnosis not present

## 2016-08-14 DIAGNOSIS — G0430 Acute necrotizing hemorrhagic encephalopathy, unspecified: Secondary | ICD-10-CM | POA: Diagnosis not present

## 2016-08-15 DIAGNOSIS — G0430 Acute necrotizing hemorrhagic encephalopathy, unspecified: Secondary | ICD-10-CM | POA: Diagnosis not present

## 2016-08-16 DIAGNOSIS — G0430 Acute necrotizing hemorrhagic encephalopathy, unspecified: Secondary | ICD-10-CM | POA: Diagnosis not present

## 2016-08-17 DIAGNOSIS — G0430 Acute necrotizing hemorrhagic encephalopathy, unspecified: Secondary | ICD-10-CM | POA: Diagnosis not present

## 2016-08-18 DIAGNOSIS — G0430 Acute necrotizing hemorrhagic encephalopathy, unspecified: Secondary | ICD-10-CM | POA: Diagnosis not present

## 2016-08-19 DIAGNOSIS — G0430 Acute necrotizing hemorrhagic encephalopathy, unspecified: Secondary | ICD-10-CM | POA: Diagnosis not present

## 2016-08-20 DIAGNOSIS — G0430 Acute necrotizing hemorrhagic encephalopathy, unspecified: Secondary | ICD-10-CM | POA: Diagnosis not present

## 2016-08-21 DIAGNOSIS — G0430 Acute necrotizing hemorrhagic encephalopathy, unspecified: Secondary | ICD-10-CM | POA: Diagnosis not present

## 2016-08-22 DIAGNOSIS — G0430 Acute necrotizing hemorrhagic encephalopathy, unspecified: Secondary | ICD-10-CM | POA: Diagnosis not present

## 2016-08-23 DIAGNOSIS — G0430 Acute necrotizing hemorrhagic encephalopathy, unspecified: Secondary | ICD-10-CM | POA: Diagnosis not present

## 2016-08-24 DIAGNOSIS — G0430 Acute necrotizing hemorrhagic encephalopathy, unspecified: Secondary | ICD-10-CM | POA: Diagnosis not present

## 2016-08-25 DIAGNOSIS — G0430 Acute necrotizing hemorrhagic encephalopathy, unspecified: Secondary | ICD-10-CM | POA: Diagnosis not present

## 2016-08-25 IMAGING — CR DG PORTABLE PELVIS
1 series · 1 of 1 positions shown · non-contrast
Comparison: 11/27/2014

CLINICAL DATA: fallen from her wheel chair and has become
unresponsive except to painful stimuli - pt w/ hx of alzhiemers
disease

EXAM:
PORTABLE PELVIS 1-2 VIEWS

[AP]
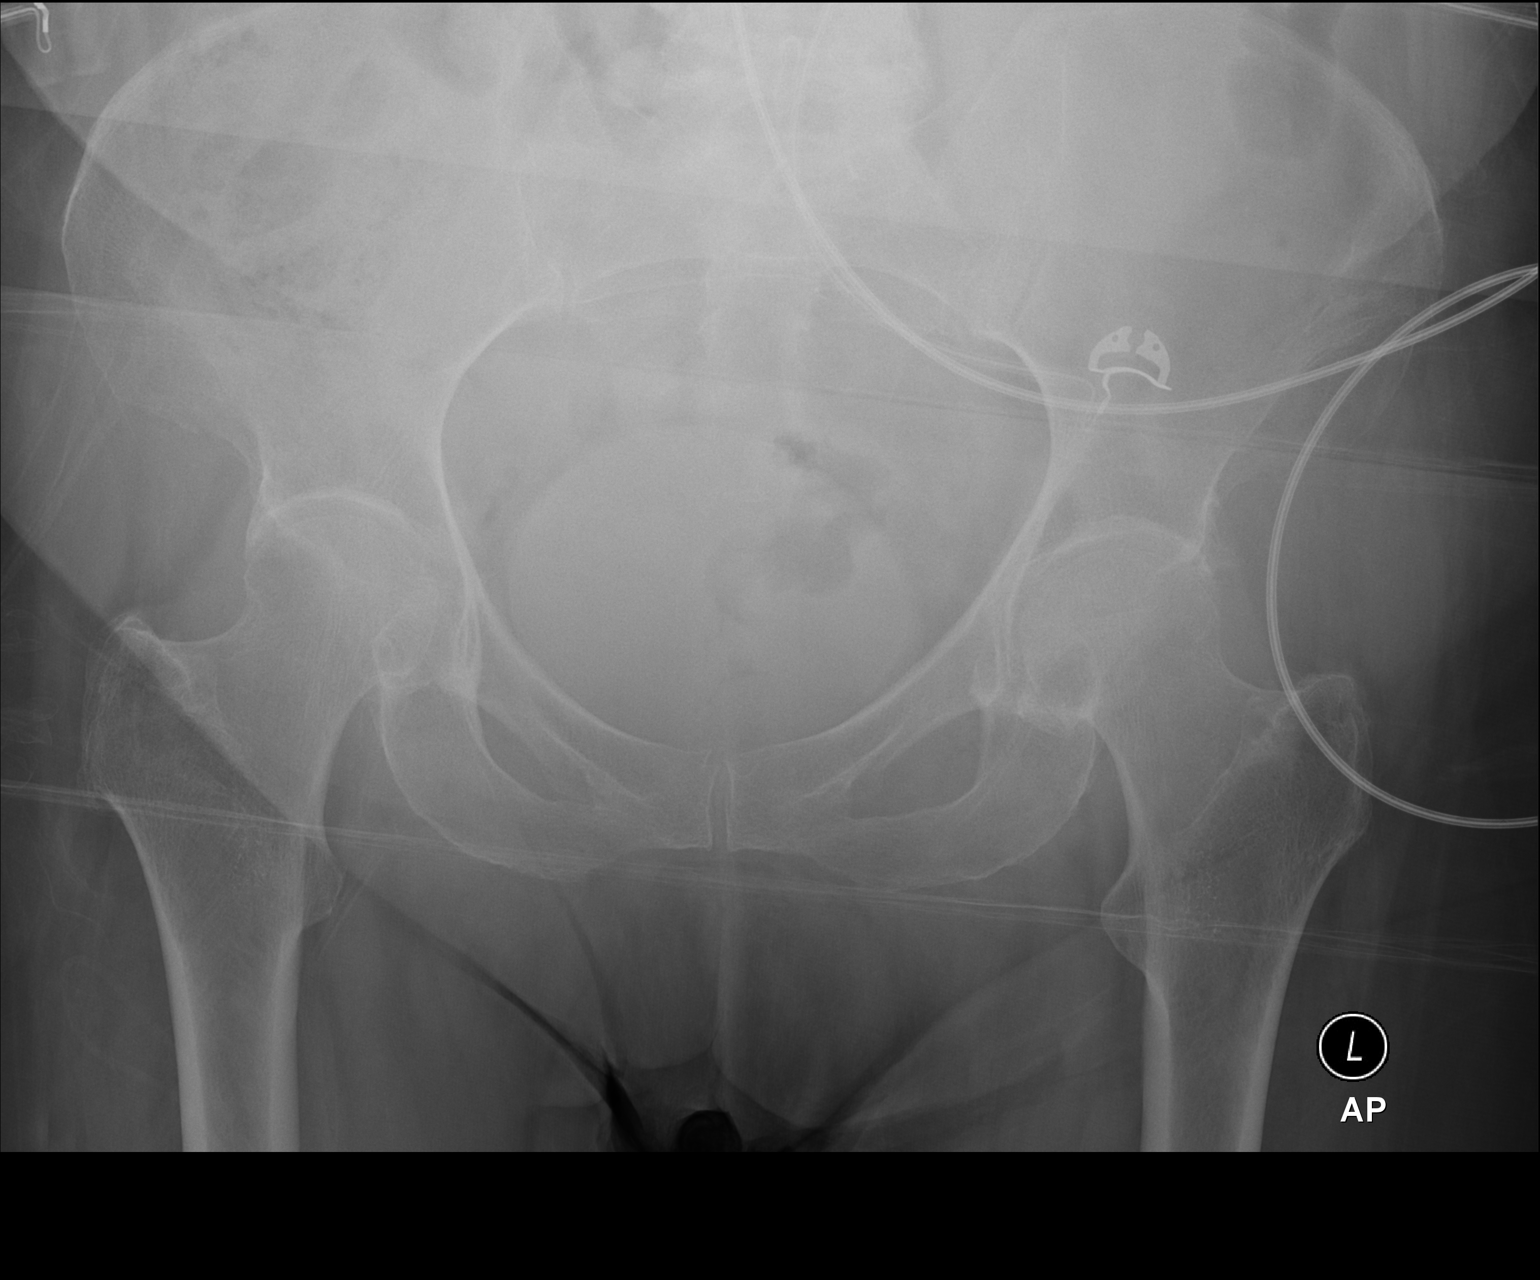

[1 of 1 positions shown; findings below may reference images not displayed]

FINDINGS: No fracture. No bone lesion. Hip joints, SI joints and symphysis
pubis are normally aligned.

Soft tissues are unremarkable.
IMPRESSION: No fracture or acute finding.

## 2016-08-25 IMAGING — CT CT CERVICAL SPINE W/O CM
3 of 4 series · 13 of 33 positions shown, 16 images · non-contrast
Comparison: 05/16/2015

CLINICAL DATA: Unwitnessed fall.

EXAM:
CT HEAD WITHOUT CONTRAST
CT CERVICAL SPINE WITHOUT CONTRAST
TECHNIQUE: Multidetector CT imaging of the head and cervical spine was
performed following the standard protocol without intravenous
contrast. Multiplanar CT image reconstructions of the cervical spine
were also generated.

[Series 3: c_spine 2.0 st · axial · 0.41mm/px · z∈[+492,+636]mm · 5 of 109 slices shown, 7 images]
[im 19/109  soft-tissue]
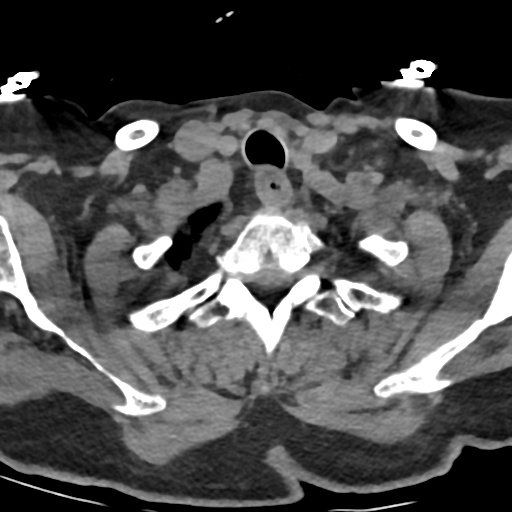
[im 19/109  bone]
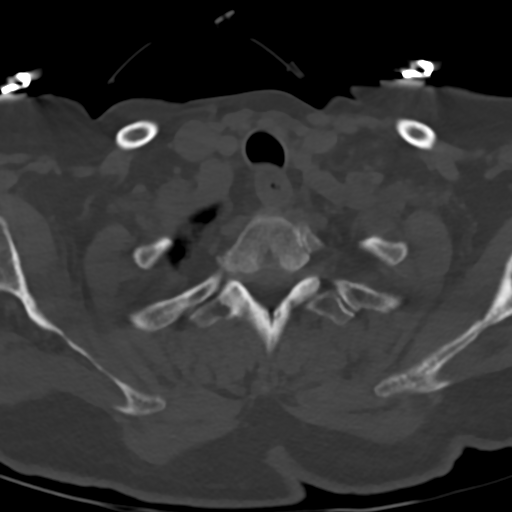
[im 37/109  bone]
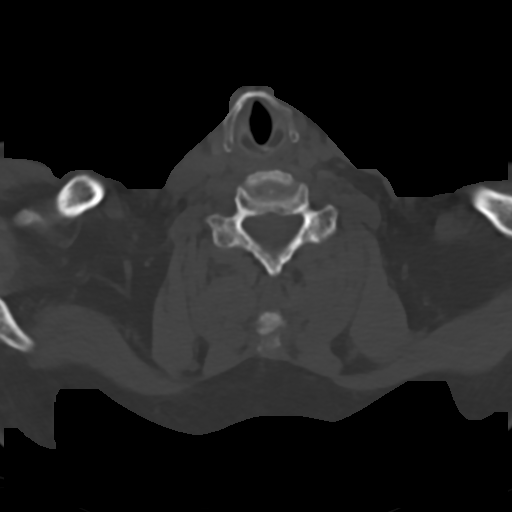
[im 55/109  bone]
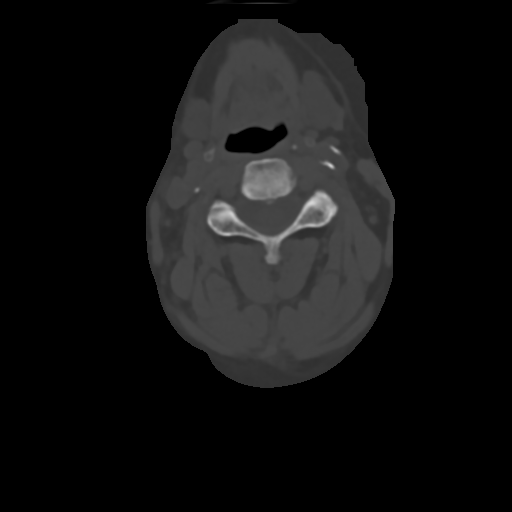
[im 73/109  bone]
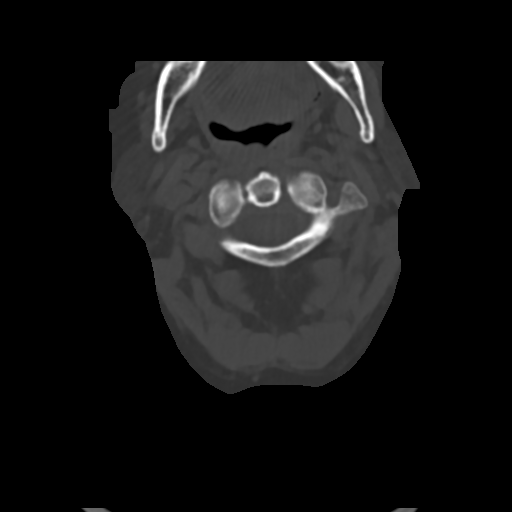
[im 91/109  soft-tissue]
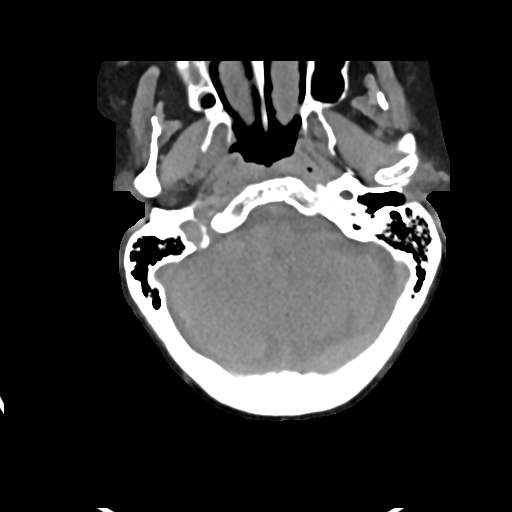
[im 91/109  bone]
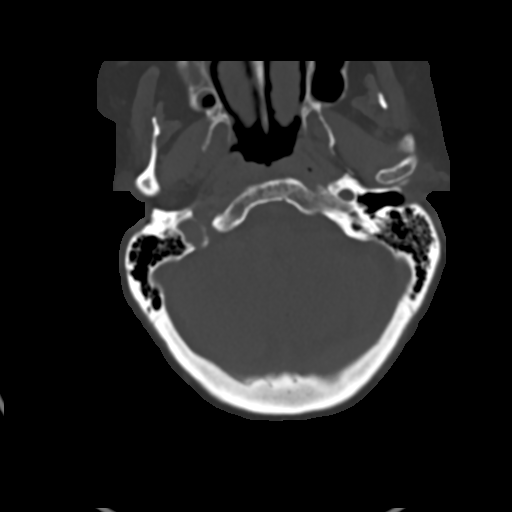

[Series 5: c_spine 2.0 sag bone · sagittal · 0.43mm/px · 5 of 61 slices shown, 6 images]
[im 21/61  bone]
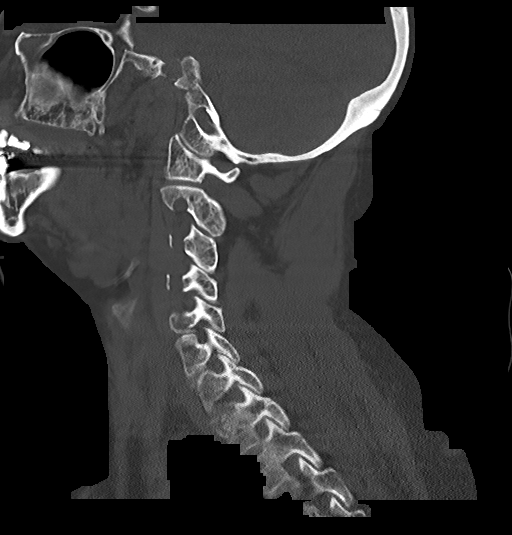
[im 26/61  bone]
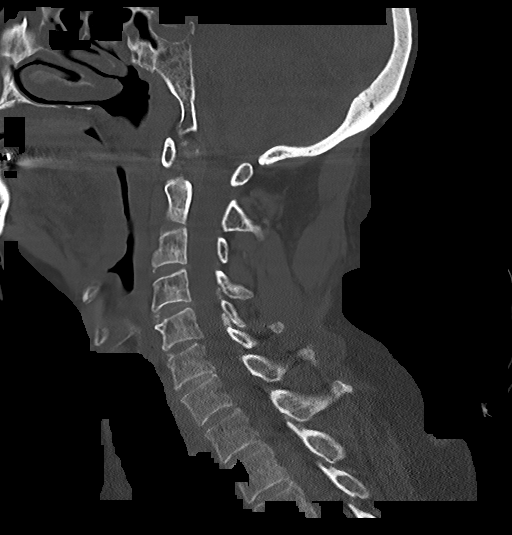
[im 31/61  soft-tissue]
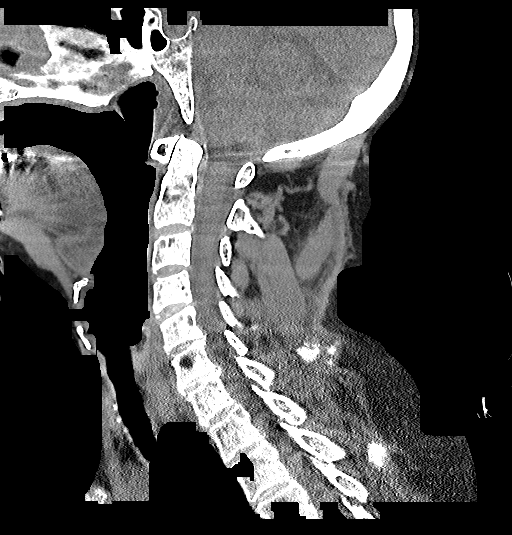
[im 31/61  bone]
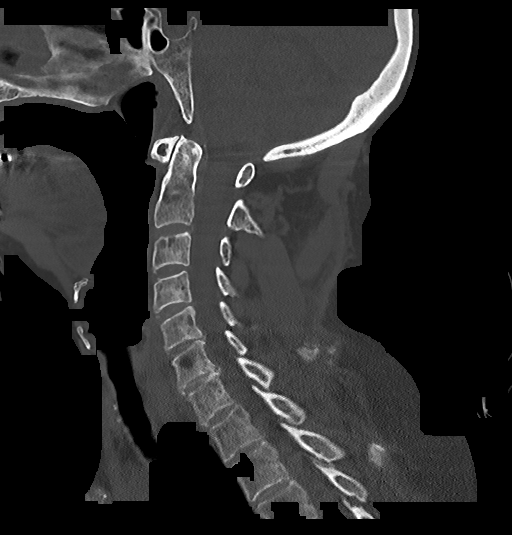
[im 36/61  bone]
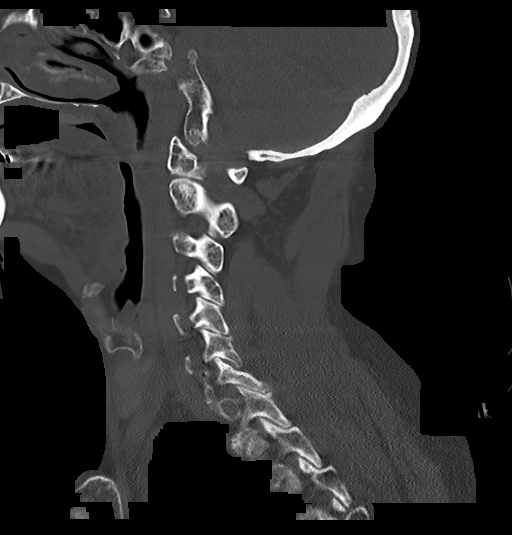
[im 41/61  bone]
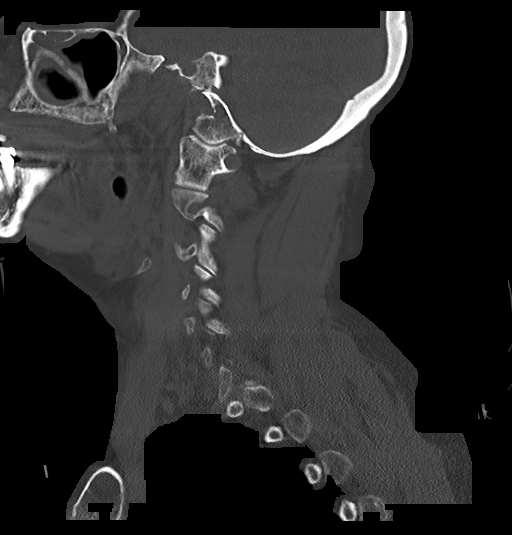

[Series 6: c_spine 2.0 cor bone · coronal · 0.32mm/px · 3 of 64 slices shown]
[im 13/64  bone]
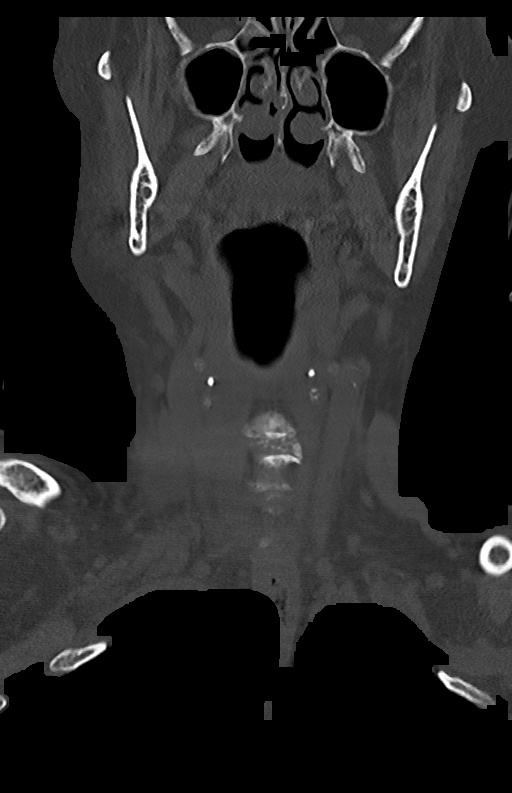
[im 26/64  bone]
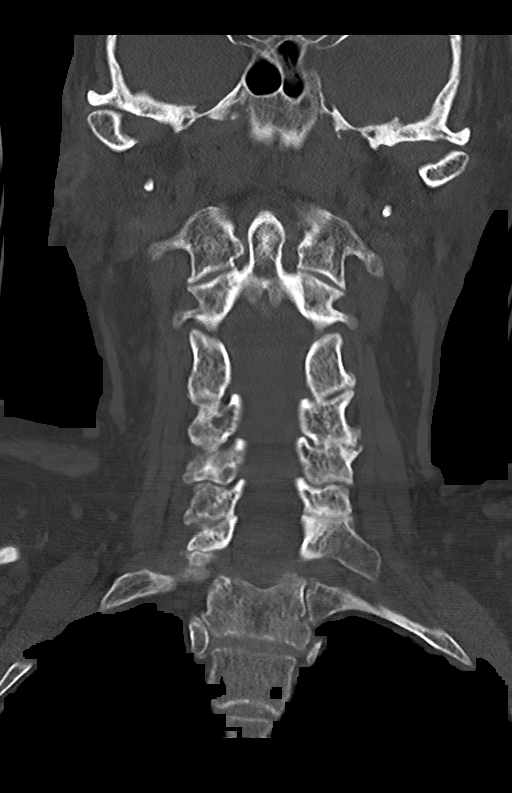
[im 38/64  bone]
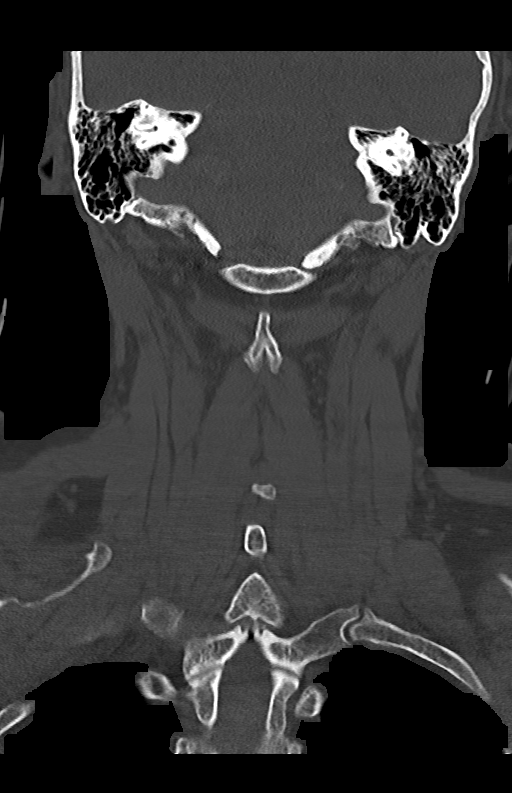

[13 of 33 positions shown; findings below may reference images not displayed]

FINDINGS: CT HEAD FINDINGS

There is prominence of the sulci and ventricles consistent with
brain atrophy. Low attenuation within the subcortical and
periventricular white matter is noted compatible with chronic
microvascular disease. No abnormal extra-axial fluid collection,
intracranial hemorrhage or mass identified. The mastoid air cells
are clear. There is mucosal thickening involving the maxillaries
sinuses as well as partial opacification of the frontal sinus and
sphenoid sinus. The calvarium appears intact.

CT CERVICAL SPINE FINDINGS

The alignment of the cervical spine is normal. The vertebral body
heights are well preserved. There is mild multi level disc space
narrowing and ventral spurring noted. The prevertebral soft tissue
space is normal. The facet joints are well-aligned. No fracture or
subluxation identified.
IMPRESSION: 1. No acute intracranial abnormalities.
2. Small vessel ischemic change and brain atrophy.
3. Paranasal sinus inflammation.
4. No evidence for cervical spine fracture.
5. Mild cervical spondylosis noted.

## 2016-08-25 IMAGING — CR DG CHEST 1V PORT
1 series · 1 of 1 positions shown · non-contrast
Comparison: Chest x-ray dated 02/12/2015.

CLINICAL DATA: Fall from wheelchair, unresponsive except to painful
stimuli. History of Alzheimer's disease.

EXAM:
PORTABLE CHEST 1 VIEW

[AP]
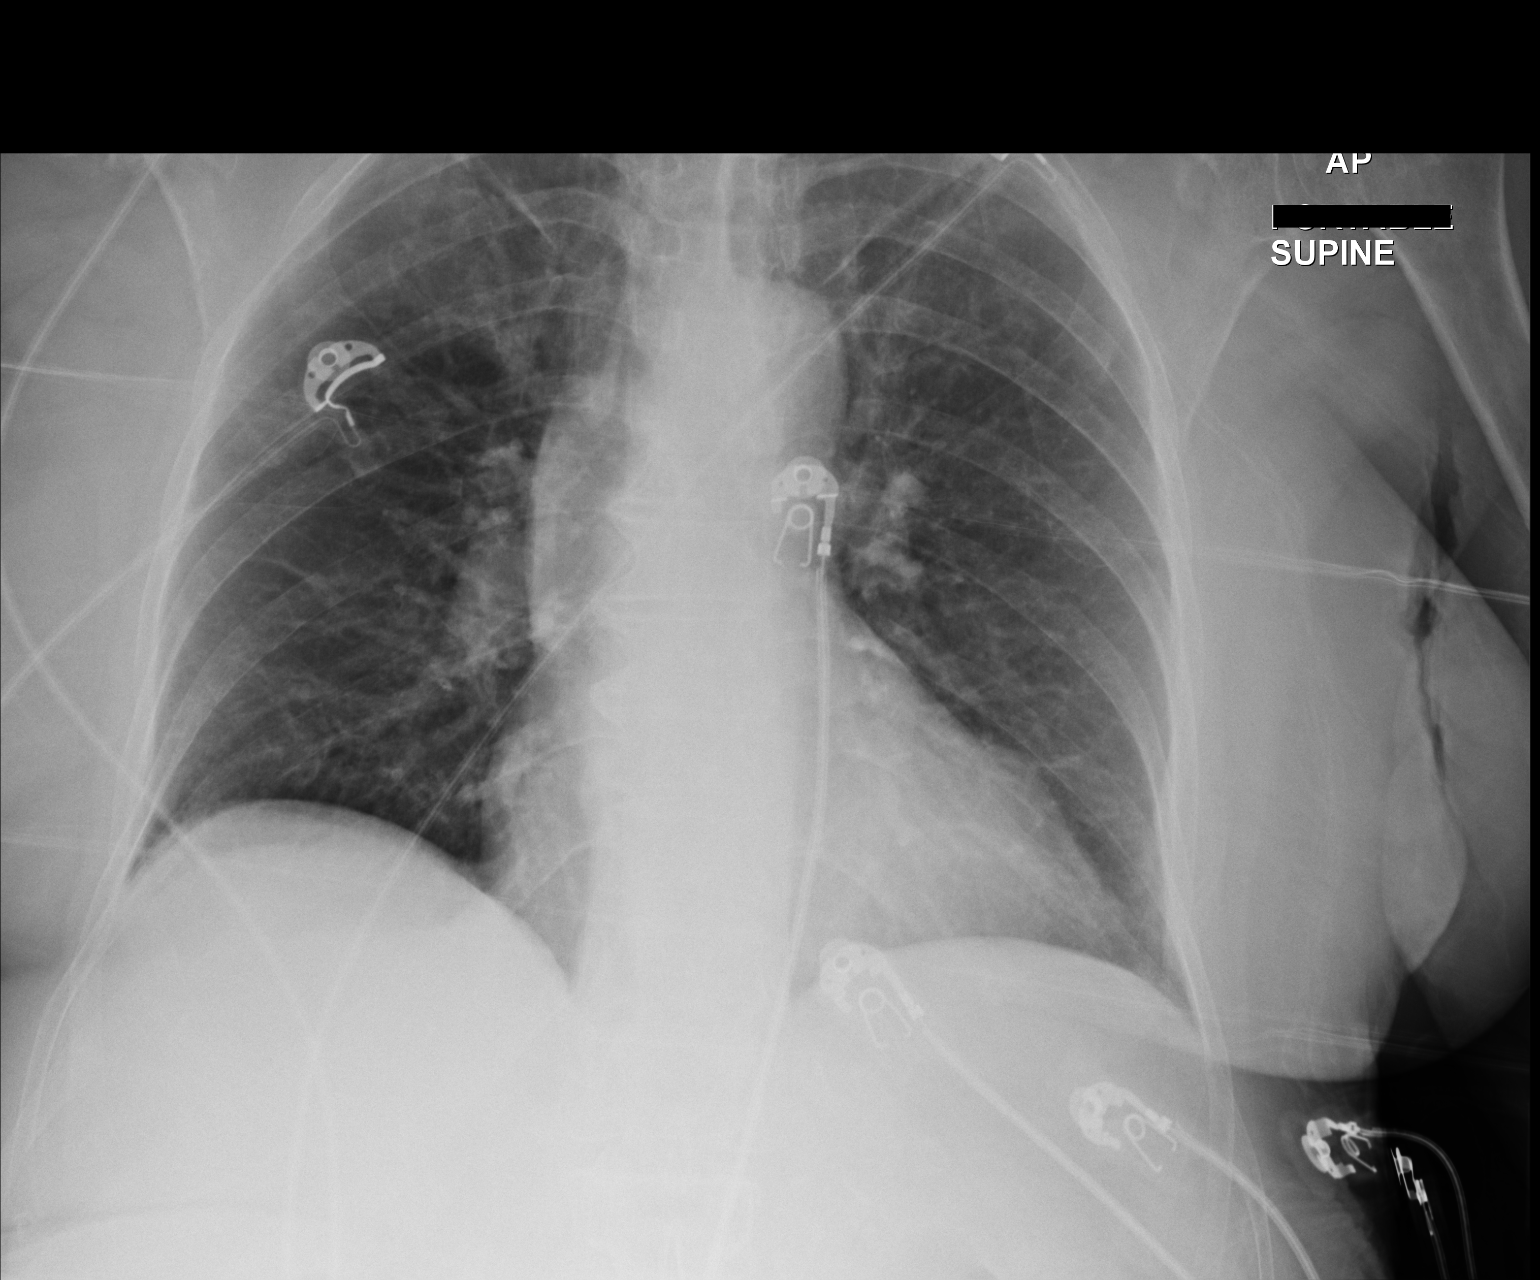

[1 of 1 positions shown; findings below may reference images not displayed]

FINDINGS: Heart size is upper normal, stable. Overall cardiomediastinal
silhouette is stable in size and configuration. Lungs are clear. No
pleural effusion. No pneumothorax seen.

Degenerative changes again noted at the right shoulder. No acute
osseous abnormality seen.
IMPRESSION: Stable chest x-ray. Lungs are clear and there is no evidence of
acute cardiopulmonary abnormality.

## 2016-08-26 DIAGNOSIS — G0430 Acute necrotizing hemorrhagic encephalopathy, unspecified: Secondary | ICD-10-CM | POA: Diagnosis not present

## 2016-08-27 ENCOUNTER — Encounter (HOSPITAL_COMMUNITY): Payer: Self-pay | Admitting: Emergency Medicine

## 2016-08-27 ENCOUNTER — Emergency Department (HOSPITAL_COMMUNITY): Payer: Medicare HMO

## 2016-08-27 ENCOUNTER — Emergency Department (HOSPITAL_COMMUNITY)
Admission: EM | Admit: 2016-08-27 | Discharge: 2016-08-28 | Disposition: A | Discharge status: Home / Self Care | Payer: Medicare HMO | Attending: Emergency Medicine | Admitting: Emergency Medicine

## 2016-08-27 DIAGNOSIS — Z7982 Long term (current) use of aspirin: Secondary | ICD-10-CM | POA: Diagnosis not present

## 2016-08-27 DIAGNOSIS — S0990XA Unspecified injury of head, initial encounter: Secondary | ICD-10-CM | POA: Diagnosis not present

## 2016-08-27 DIAGNOSIS — F0390 Unspecified dementia without behavioral disturbance: Secondary | ICD-10-CM | POA: Diagnosis not present

## 2016-08-27 DIAGNOSIS — W19XXXA Unspecified fall, initial encounter: Secondary | ICD-10-CM

## 2016-08-27 DIAGNOSIS — E039 Hypothyroidism, unspecified: Secondary | ICD-10-CM | POA: Diagnosis not present

## 2016-08-27 DIAGNOSIS — M5489 Other dorsalgia: Secondary | ICD-10-CM | POA: Diagnosis not present

## 2016-08-27 DIAGNOSIS — N189 Chronic kidney disease, unspecified: Secondary | ICD-10-CM | POA: Insufficient documentation

## 2016-08-27 DIAGNOSIS — T148XXA Other injury of unspecified body region, initial encounter: Secondary | ICD-10-CM | POA: Diagnosis not present

## 2016-08-27 DIAGNOSIS — Z96641 Presence of right artificial hip joint: Secondary | ICD-10-CM | POA: Insufficient documentation

## 2016-08-27 DIAGNOSIS — Z87891 Personal history of nicotine dependence: Secondary | ICD-10-CM | POA: Insufficient documentation

## 2016-08-27 DIAGNOSIS — Y939 Activity, unspecified: Secondary | ICD-10-CM | POA: Diagnosis not present

## 2016-08-27 DIAGNOSIS — Z79899 Other long term (current) drug therapy: Secondary | ICD-10-CM | POA: Diagnosis not present

## 2016-08-27 DIAGNOSIS — Y929 Unspecified place or not applicable: Secondary | ICD-10-CM | POA: Diagnosis not present

## 2016-08-27 DIAGNOSIS — S199XXA Unspecified injury of neck, initial encounter: Secondary | ICD-10-CM | POA: Diagnosis not present

## 2016-08-27 DIAGNOSIS — J45909 Unspecified asthma, uncomplicated: Secondary | ICD-10-CM | POA: Insufficient documentation

## 2016-08-27 DIAGNOSIS — Y999 Unspecified external cause status: Secondary | ICD-10-CM | POA: Diagnosis not present

## 2016-08-27 DIAGNOSIS — W1811XA Fall from or off toilet without subsequent striking against object, initial encounter: Secondary | ICD-10-CM | POA: Diagnosis not present

## 2016-08-27 DIAGNOSIS — I129 Hypertensive chronic kidney disease with stage 1 through stage 4 chronic kidney disease, or unspecified chronic kidney disease: Secondary | ICD-10-CM | POA: Insufficient documentation

## 2016-08-27 NOTE — ED Triage Notes (Addendum)
[facility] with complaints of a fall where she slipped off of the toilet.  No obvious trauma.  Facility denies hitting her head.  Denies LOC.  Alert to baseline. Pt initially complained of lower back pain and states it is going away. Vitals WNL. No blood thinner use

## 2016-08-27 NOTE — ED Notes (Signed)
Bed: [id number] Expected date:  Expected time:  Means of arrival:  Comments: EMS from SNF/fall/flank pain

## 2016-08-27 NOTE — ED Notes (Signed)
Report to Liz at [facility] 

## 2016-08-27 NOTE — Discharge Instructions (Signed)
We saw you in the ER after you had a fall. °All the imaging results are normal, no fractures seen. No evidence of brain bleed. °Please be very careful with walking, and do everything possible to prevent falls. ° ° °

## 2016-08-28 DIAGNOSIS — G0430 Acute necrotizing hemorrhagic encephalopathy, unspecified: Secondary | ICD-10-CM | POA: Diagnosis not present

## 2016-08-28 DIAGNOSIS — R4182 Altered mental status, unspecified: Secondary | ICD-10-CM | POA: Diagnosis not present

## 2016-08-28 NOTE — ED Provider Notes (Signed)
[facility]-EMERGENCY DEPT Provider Note   CSN: [id number] Arrival date & time: 08/27/16  2011     History   Chief Complaint Chief Complaint  Patient presents with  . Fall    HPI [name] is a 83 y.o. female.  HPI Pt comes in with cc of fall. LEVEL 5 CAVEAT FOR DEMENTIA  Pt resides at SNF and had a witnessed fall. Pt is unable to provide any meaningful history. She denies pain anywhere.  Past Medical History:  Diagnosis Date  . Acute bronchitis 04/10/2013  . Anemia   . Angina   . Anxiety   . Arthritis   . Arthritis 10/02/2012  . Asthma   . Chicken pox   . Chronic kidney disease   . Colon polyps   . Dehydration 08/31/2013  . Dementia   . Depression   . Dysrhythmia   . Headache(784.0)   . Heart murmur   . Hypertension   . Hypothyroidism   . Left leg pain 10/02/2012  . Pneumonia   . Recurrent upper respiratory infection (URI)   . Shortness of breath   . UTI (urinary tract infection) 12/30/2012  . Valvular heart disease 09/04/2012    Patient Active Problem List   Diagnosis Date Noted  . Pressure ulcer 09/20/2015  . Displaced fracture of right femoral neck (HCC) 09/19/2015  . Altered mental status   . Confusion 06/26/2015  . Fall   . Aspiration pneumonia (HCC) 01/10/2015  . Acute encephalopathy 01/08/2015  . UTI (urinary tract infection) 01/08/2015  . 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 Recurrent falls 11/27/2014  . FTT (failure to thrive) in adult 10/26/2014  . Dehydration 10/26/2014  . Abnormal urinalysis 10/26/2014  . Normocytic anemia 10/26/2014  . Lump of skin of left upper extremity 09/07/2014  . Rash and nonspecific skin eruption 05/08/2014  . Syncope and collapse 11/30/2013  . Low back pain, episodic 11/20/2013  . Chronic kidney disease   . Insomnia 08/31/2013  . Arthritis 10/02/2012  . Valvular heart disease 09/04/2012  . Restless leg syndrome 06/06/2012  . Trochanteric bursitis 08/02/2011  . Dementia 08/02/2011  . Leg  pain 03/28/2011  . Hypertension 09/22/2010  . Hypothyroidism 09/22/2010    Past Surgical History:  Procedure Laterality Date  . ABDOMINAL HYSTERECTOMY    . APPENDECTOMY    . BACK SURGERY    . CHOLECYSTECTOMY    . COLONOSCOPY W/ POLYPECTOMY    . EYE SURGERY     cataract removed and eye lids lifted  . feet surgery    . FRACTURE SURGERY     bilateral arms  . HAND SURGERY    . HIP ARTHROPLASTY Right 09/20/2015   Procedure: ARTHROPLASTY RIGHT  BIPOLAR ANTERIOR HIP (HEMIARTHROPLASTY);  Surgeon: Samson Frederic, MD;  Location: [facility];  Service: Orthopedics;  Laterality: Right;  . kidney stones    . SHOULDER ARTHROSCOPY    . TONSILLECTOMY    . TONSILLECTOMY    . TUBAL LIGATION      OB History    No data available       Home Medications    Prior to Admission medications   Medication Sig Start Date End Date Taking? Authorizing Provider  aspirin 81 MG chewable tablet Chew 81 mg by mouth daily.   Yes Historical Provider, MD  clonazePAM (KLONOPIN) 0.5 MG tablet Take 1 tablet (0.5 mg total) by mouth 2 (two) times daily. Patient taking differently: Take 0.25-0.5 mg by mouth See admin instructions. Take 1/2 tablet (  0.25 mg) by mouth every morning and 1 tablet (0.5 mg) at bedtime 09/23/15  Yes Elease Etienne, MD  divalproex (DEPAKOTE SPRINKLE) 125 MG capsule Take 125 mg by mouth 3 (three) times daily. 8am, 2pm, 8pm   Yes Historical Provider, MD  escitalopram (LEXAPRO) 5 MG tablet Take 5 mg by mouth daily.   Yes Historical Provider, MD  levothyroxine (SYNTHROID, LEVOTHROID) 50 MCG tablet Take 50 mcg by mouth daily at 6 (six) AM.    Yes Historical Provider, MD  Multiple Vitamins-Minerals (MULTIVITAMIN WITH MINERALS) tablet Take 1 tablet by mouth daily.   Yes Historical Provider, MD  Nutritional Supplements (NUTRITIONAL DRINK) LIQD Take 1 Bottle by mouth 3 (three) times daily with meals. Mighty Shakes   Yes Historical Provider, MD  rivastigmine (EXELON) 1.5 MG capsule Take 1.5 mg by mouth 2  (two) times daily.   Yes Historical Provider, MD  senna (SENOKOT) 8.6 MG TABS tablet Take 2 tablets by mouth 2 (two) times daily.   Yes Historical Provider, MD  traMADol (ULTRAM) 50 MG tablet Take 1 tablet (50 mg total) by mouth every 6 (six) hours as needed for severe pain. Patient taking differently: Take 50 mg by mouth See admin instructions. Take 1 tablet (50 mg) by mouth twice daily, may also take 1 tablet every 6 hours as needed for moderate or severe pain 09/23/15  Yes Elease Etienne, MD  traZODone (DESYREL) 50 MG tablet Take 50 mg by mouth at bedtime.    Yes Historical Provider, MD  acetaminophen (TYLENOL) 500 MG tablet Take 500 mg by mouth See admin instructions. Take 1 caplet by mouth every 4 hours as needed for 24 hours for headache, minor discomfort, fever - do not exceed 2000 mg in 2 hours    Historical Provider, MD  alum & mag hydroxide-simeth (MINTOX) 200-200-20 MG/5ML suspension Take 30 mLs by mouth every 6 (six) hours as needed for indigestion or heartburn.     Historical Provider, MD  guaifenesin (ROBITUSSIN) 100 MG/5ML syrup Take 200 mg by mouth every 6 (six) hours as needed for cough.     Historical Provider, MD  loperamide (IMODIUM) 2 MG capsule Take 2 mg by mouth as needed for diarrhea or loose stools.     Historical Provider, MD  magnesium hydroxide (MILK OF MAGNESIA) 400 MG/5ML suspension Take 30 mLs by mouth at bedtime as needed for mild constipation.    Historical Provider, MD  Neomycin-Bacitracin-Polymyxin (TRIPLE ANTIBIOTIC) [phone] OINT Apply topically as needed (for Skin tear and abrasions).    Historical Provider, MD    Family History Family History  Problem Relation Age of Onset  . Heart disease Mother   . Heart disease Father   . Heart disease Other   . Birth defects Other     Social History Social History  Substance Use Topics  . Smoking status: Former Smoker    Packs/day: 0.20    Years: 1.00    Types: Cigarettes    Quit date: 06/02/1941  .  Smokeless tobacco: Never Used  . Alcohol use No     Allergies   Morphine and related; Codeine; and Hydrocodone   Review of Systems Review of Systems  Unable to perform ROS: Dementia     Physical Exam Updated Vital Signs BP (!) 156/66 (BP Location: Left Arm)   Pulse 73   Temp 98.2 F (36.8 C) (Oral)   Resp 18   SpO2 94%   Physical Exam  Constitutional: She is oriented to person, place, and  time. She appears well-developed and well-nourished.  HENT:  Head: Normocephalic and atraumatic.  Eyes: EOM are normal. Pupils are equal, round, and reactive to light.  Neck: Neck supple.  Cardiovascular: Normal rate, regular rhythm and normal heart sounds.   No murmur heard. Pulmonary/Chest: Effort normal. No respiratory distress.  Abdominal: Soft. She exhibits no distension. There is no tenderness. There is no rebound and no guarding.  Musculoskeletal:  Head to toe evaluation shows no hematoma, bleeding of the scalp, no facial abrasions, no spine step offs, crepitus of the chest or neck, no tenderness to palpation of the bilateral upper and lower extremities, no gross deformities, no chest tenderness, no pelvic pain.   Neurological: She is alert and oriented to person, place, and time.  Skin: Skin is warm and dry.  Nursing note and vitals reviewed.     ED Treatments / Results  Labs (all labs ordered are listed, but only abnormal results are displayed) Labs Reviewed - No data to display  EKG  EKG Interpretation None       Radiology Ct Head Wo Contrast  Result Date: 08/27/2016 CLINICAL DATA:  Fall from toilet.  Dementia patient. EXAM: CT HEAD WITHOUT CONTRAST CT CERVICAL SPINE WITHOUT CONTRAST TECHNIQUE: Multidetector CT imaging of the head and cervical spine was performed following the standard protocol without intravenous contrast. Multiplanar CT image reconstructions of the cervical spine were also generated. COMPARISON:  Head and cervical spine CT 18 days prior  08/09/2016 FINDINGS: CT HEAD FINDINGS Brain: Stable atrophy and chronic small vessel ischemia. Remote lacunar infarct in left basal ganglia, unchanged. No acute hemorrhage or evidence of acute ischemia. No subdural fluid collection. No hydrocephalus, mass effect or midline shift. Vascular: Atherosclerosis of skullbase vasculature without hyperdense vessel or abnormal calcification. Skull: No skull fracture. Sinuses/Orbits: Stable fluid in left side of sphenoid sinus. Remaining included paranasal sinuses are clear. Mastoid air cells are well-aerated. Other: Diminished frontal scalp hematomas from prior exam. CT CERVICAL SPINE FINDINGS Alignment: Normal.  Patient's head is tilted in the scanner. Skull base and vertebrae: No acute fracture. Vertebral body heights are maintained. Skull base and dens are intact. Bony under mineralization. Soft tissues and spinal canal: No prevertebral fluid or swelling. No visible canal hematoma. Disc levels: Disc space narrowing and endplate spurring is most prominent at C5-C6 and C6-C7. Scattered facet arthropathy. Upper chest: No acute abnormality. Other: Carotid vascular calcifications. IMPRESSION: 1. Stable atrophy and chronic small vessel ischemia without acute intracranial abnormality. No skull fracture. 2. Stable degenerative change in the cervical spine without acute fracture or subluxation. Electronically Signed   By: Rubye Oaks M.D.   On: 08/27/2016 22:31   Ct Cervical Spine Wo Contrast  Result Date: 08/27/2016 CLINICAL DATA:  Fall from toilet.  Dementia patient. EXAM: CT HEAD WITHOUT CONTRAST CT CERVICAL SPINE WITHOUT CONTRAST TECHNIQUE: Multidetector CT imaging of the head and cervical spine was performed following the standard protocol without intravenous contrast. Multiplanar CT image reconstructions of the cervical spine were also generated. COMPARISON:  Head and cervical spine CT 18 days prior 08/09/2016 FINDINGS: CT HEAD FINDINGS Brain: Stable atrophy and  chronic small vessel ischemia. Remote lacunar infarct in left basal ganglia, unchanged. No acute hemorrhage or evidence of acute ischemia. No subdural fluid collection. No hydrocephalus, mass effect or midline shift. Vascular: Atherosclerosis of skullbase vasculature without hyperdense vessel or abnormal calcification. Skull: No skull fracture. Sinuses/Orbits: Stable fluid in left side of sphenoid sinus. Remaining included paranasal sinuses are clear. Mastoid air cells are well-aerated. Other:  Diminished frontal scalp hematomas from prior exam. CT CERVICAL SPINE FINDINGS Alignment: Normal.  Patient's head is tilted in the scanner. Skull base and vertebrae: No acute fracture. Vertebral body heights are maintained. Skull base and dens are intact. Bony under mineralization. Soft tissues and spinal canal: No prevertebral fluid or swelling. No visible canal hematoma. Disc levels: Disc space narrowing and endplate spurring is most prominent at C5-C6 and C6-C7. Scattered facet arthropathy. Upper chest: No acute abnormality. Other: Carotid vascular calcifications. IMPRESSION: 1. Stable atrophy and chronic small vessel ischemia without acute intracranial abnormality. No skull fracture. 2. Stable degenerative change in the cervical spine without acute fracture or subluxation. Electronically Signed   By: Rubye OaksMelanie  Ehinger M.D.   On: 08/27/2016 22:31    Procedures Procedures (including critical care time)  Medications Ordered in ED Medications - No data to display   Initial Impression / Assessment and Plan / ED Course  I have reviewed the triage vital signs and the nursing notes.  Pertinent labs & imaging results that were available during my care of the patient were reviewed by me and considered in my medical decision making (see chart for details).     DDx includes: - Mechanical falls - ICH - Fractures - Contusions - Soft tissue injury  Pt comes in after a fall. She is demented and not able to partake  in meaningful history or exam. CT head and Cspine ordered due to age and poor exam - and are neg. We will d/c.  Final Clinical Impressions(s) / ED Diagnoses   Final diagnoses:  Fall, initial encounter  Dementia without behavioral disturbance, unspecified dementia type    New Prescriptions New Prescriptions   No medications on file     Derwood KaplanAnkit Tanmay Halteman, MD 08/28/16 [phone]

## 2016-08-28 NOTE — ED Notes (Signed)
[facility] arrived to receive patient.  

## 2016-08-29 DIAGNOSIS — G0430 Acute necrotizing hemorrhagic encephalopathy, unspecified: Secondary | ICD-10-CM | POA: Diagnosis not present

## 2016-08-30 DIAGNOSIS — G0430 Acute necrotizing hemorrhagic encephalopathy, unspecified: Secondary | ICD-10-CM | POA: Diagnosis not present

## 2016-08-31 DIAGNOSIS — G0430 Acute necrotizing hemorrhagic encephalopathy, unspecified: Secondary | ICD-10-CM | POA: Diagnosis not present

## 2016-09-01 DIAGNOSIS — G0430 Acute necrotizing hemorrhagic encephalopathy, unspecified: Secondary | ICD-10-CM | POA: Diagnosis not present

## 2016-09-02 DIAGNOSIS — G0430 Acute necrotizing hemorrhagic encephalopathy, unspecified: Secondary | ICD-10-CM | POA: Diagnosis not present

## 2016-09-02 DIAGNOSIS — G309 Alzheimer's disease, unspecified: Secondary | ICD-10-CM | POA: Diagnosis not present

## 2016-09-02 DIAGNOSIS — F0281 Dementia in other diseases classified elsewhere with behavioral disturbance: Secondary | ICD-10-CM | POA: Diagnosis not present

## 2016-09-02 DIAGNOSIS — R296 Repeated falls: Secondary | ICD-10-CM | POA: Diagnosis not present

## 2016-09-03 DIAGNOSIS — G0430 Acute necrotizing hemorrhagic encephalopathy, unspecified: Secondary | ICD-10-CM | POA: Diagnosis not present

## 2016-09-04 DIAGNOSIS — G0430 Acute necrotizing hemorrhagic encephalopathy, unspecified: Secondary | ICD-10-CM | POA: Diagnosis not present

## 2016-09-05 DIAGNOSIS — G0430 Acute necrotizing hemorrhagic encephalopathy, unspecified: Secondary | ICD-10-CM | POA: Diagnosis not present

## 2016-09-06 DIAGNOSIS — G0430 Acute necrotizing hemorrhagic encephalopathy, unspecified: Secondary | ICD-10-CM | POA: Diagnosis not present

## 2016-09-07 DIAGNOSIS — G0430 Acute necrotizing hemorrhagic encephalopathy, unspecified: Secondary | ICD-10-CM | POA: Diagnosis not present

## 2016-09-08 DIAGNOSIS — G0430 Acute necrotizing hemorrhagic encephalopathy, unspecified: Secondary | ICD-10-CM | POA: Diagnosis not present

## 2016-09-08 DIAGNOSIS — R32 Unspecified urinary incontinence: Secondary | ICD-10-CM | POA: Diagnosis not present

## 2016-09-09 DIAGNOSIS — F411 Generalized anxiety disorder: Secondary | ICD-10-CM | POA: Diagnosis not present

## 2016-09-09 DIAGNOSIS — G0430 Acute necrotizing hemorrhagic encephalopathy, unspecified: Secondary | ICD-10-CM | POA: Diagnosis not present

## 2016-09-09 DIAGNOSIS — F0281 Dementia in other diseases classified elsewhere with behavioral disturbance: Secondary | ICD-10-CM | POA: Diagnosis not present

## 2016-09-10 DIAGNOSIS — G0430 Acute necrotizing hemorrhagic encephalopathy, unspecified: Secondary | ICD-10-CM | POA: Diagnosis not present

## 2016-09-11 DIAGNOSIS — G0430 Acute necrotizing hemorrhagic encephalopathy, unspecified: Secondary | ICD-10-CM | POA: Diagnosis not present

## 2016-09-12 ENCOUNTER — Emergency Department (HOSPITAL_COMMUNITY): Payer: Medicare HMO

## 2016-09-12 ENCOUNTER — Encounter (HOSPITAL_COMMUNITY): Payer: Self-pay | Admitting: Emergency Medicine

## 2016-09-12 ENCOUNTER — Emergency Department (HOSPITAL_COMMUNITY)
Admission: EM | Admit: 2016-09-12 | Discharge: 2016-09-12 | Disposition: A | Discharge status: Home / Self Care | Payer: Medicare HMO | Attending: Emergency Medicine | Admitting: Emergency Medicine

## 2016-09-12 DIAGNOSIS — Y939 Activity, unspecified: Secondary | ICD-10-CM | POA: Insufficient documentation

## 2016-09-12 DIAGNOSIS — N189 Chronic kidney disease, unspecified: Secondary | ICD-10-CM | POA: Diagnosis not present

## 2016-09-12 DIAGNOSIS — Z79899 Other long term (current) drug therapy: Secondary | ICD-10-CM | POA: Insufficient documentation

## 2016-09-12 DIAGNOSIS — Z87891 Personal history of nicotine dependence: Secondary | ICD-10-CM | POA: Diagnosis not present

## 2016-09-12 DIAGNOSIS — Y999 Unspecified external cause status: Secondary | ICD-10-CM | POA: Insufficient documentation

## 2016-09-12 DIAGNOSIS — J45909 Unspecified asthma, uncomplicated: Secondary | ICD-10-CM | POA: Diagnosis not present

## 2016-09-12 DIAGNOSIS — M549 Dorsalgia, unspecified: Secondary | ICD-10-CM | POA: Diagnosis not present

## 2016-09-12 DIAGNOSIS — I129 Hypertensive chronic kidney disease with stage 1 through stage 4 chronic kidney disease, or unspecified chronic kidney disease: Secondary | ICD-10-CM | POA: Diagnosis not present

## 2016-09-12 DIAGNOSIS — S199XXA Unspecified injury of neck, initial encounter: Secondary | ICD-10-CM | POA: Diagnosis not present

## 2016-09-12 DIAGNOSIS — R220 Localized swelling, mass and lump, head: Secondary | ICD-10-CM | POA: Diagnosis not present

## 2016-09-12 DIAGNOSIS — W19XXXA Unspecified fall, initial encounter: Secondary | ICD-10-CM | POA: Insufficient documentation

## 2016-09-12 DIAGNOSIS — Y92002 Bathroom of unspecified non-institutional (private) residence single-family (private) house as the place of occurrence of the external cause: Secondary | ICD-10-CM | POA: Diagnosis not present

## 2016-09-12 DIAGNOSIS — G0430 Acute necrotizing hemorrhagic encephalopathy, unspecified: Secondary | ICD-10-CM | POA: Diagnosis not present

## 2016-09-12 DIAGNOSIS — S064X0A Epidural hemorrhage without loss of consciousness, initial encounter: Secondary | ICD-10-CM | POA: Diagnosis not present

## 2016-09-12 DIAGNOSIS — E039 Hypothyroidism, unspecified: Secondary | ICD-10-CM | POA: Diagnosis not present

## 2016-09-12 DIAGNOSIS — S79912A Unspecified injury of left hip, initial encounter: Secondary | ICD-10-CM | POA: Diagnosis not present

## 2016-09-12 DIAGNOSIS — S7002XA Contusion of left hip, initial encounter: Secondary | ICD-10-CM | POA: Insufficient documentation

## 2016-09-12 DIAGNOSIS — S0003XA Contusion of scalp, initial encounter: Secondary | ICD-10-CM | POA: Diagnosis not present

## 2016-09-12 DIAGNOSIS — S098XXA Other specified injuries of head, initial encounter: Secondary | ICD-10-CM | POA: Diagnosis not present

## 2016-09-12 DIAGNOSIS — M25561 Pain in right knee: Secondary | ICD-10-CM | POA: Diagnosis not present

## 2016-09-12 DIAGNOSIS — S0990XA Unspecified injury of head, initial encounter: Secondary | ICD-10-CM | POA: Diagnosis not present

## 2016-09-12 DIAGNOSIS — S29002A Unspecified injury of muscle and tendon of back wall of thorax, initial encounter: Secondary | ICD-10-CM | POA: Diagnosis not present

## 2016-09-12 DIAGNOSIS — M25552 Pain in left hip: Secondary | ICD-10-CM | POA: Diagnosis not present

## 2016-09-12 DIAGNOSIS — G309 Alzheimer's disease, unspecified: Secondary | ICD-10-CM | POA: Insufficient documentation

## 2016-09-12 DIAGNOSIS — M25462 Effusion, left knee: Secondary | ICD-10-CM | POA: Diagnosis not present

## 2016-09-12 DIAGNOSIS — Z7982 Long term (current) use of aspirin: Secondary | ICD-10-CM | POA: Insufficient documentation

## 2016-09-12 DIAGNOSIS — M546 Pain in thoracic spine: Secondary | ICD-10-CM | POA: Diagnosis not present

## 2016-09-12 LAB — CBC WITH DIFFERENTIAL/PLATELET
BASOS PCT: 0 %
Basophils Absolute: 0 10*3/uL (ref 0.0–0.1)
EOS PCT: 1 %
Eosinophils Absolute: 0.1 10*3/uL (ref 0.0–0.7)
HCT: 35.6 % — ABNORMAL LOW (ref 36.0–46.0)
Hemoglobin: 12 g/dL (ref 12.0–15.0)
LYMPHS ABS: 1.8 10*3/uL (ref 0.7–4.0)
Lymphocytes Relative: 15 %
MCH: 29.7 pg (ref 26.0–34.0)
MCHC: 33.7 g/dL (ref 30.0–36.0)
MCV: 88.1 fL (ref 78.0–100.0)
MONO ABS: 0.6 10*3/uL (ref 0.1–1.0)
MONOS PCT: 5 %
NEUTROS PCT: 79 %
Neutro Abs: 9 10*3/uL — ABNORMAL HIGH (ref 1.7–7.7)
PLATELETS: 223 10*3/uL (ref 150–400)
RBC: 4.04 MIL/uL (ref 3.87–5.11)
RDW: 13.1 % (ref 11.5–15.5)
WBC: 11.5 10*3/uL — ABNORMAL HIGH (ref 4.0–10.5)

## 2016-09-12 LAB — BASIC METABOLIC PANEL
Anion gap: 7 (ref 5–15)
BUN: 19 mg/dL (ref 6–20)
CALCIUM: 9.1 mg/dL (ref 8.9–10.3)
CO2: 29 mmol/L (ref 22–32)
CREATININE: 0.75 mg/dL (ref 0.44–1.00)
Chloride: 106 mmol/L (ref 101–111)
GFR calc non Af Amer: >60 mL/min (ref >60)
Glucose, Bld: 106 mg/dL — ABNORMAL HIGH (ref 65–99)
Potassium: 4.4 mmol/L (ref 3.5–5.1)
SODIUM: 142 mmol/L (ref 135–145)

## 2016-09-12 IMAGING — DX DG CHEST 1V PORT
1 series · 1 of 1 positions shown · non-contrast
Comparison: 06/08/2015 and earlier.

CLINICAL DATA: [AGE] [HOSPITAL] patient patient with
Alzheimer's dementia presenting with acute onset of generalized
weakness and acute mental status changes.

EXAM:
PORTABLE CHEST 1 VIEW

[chest ap]
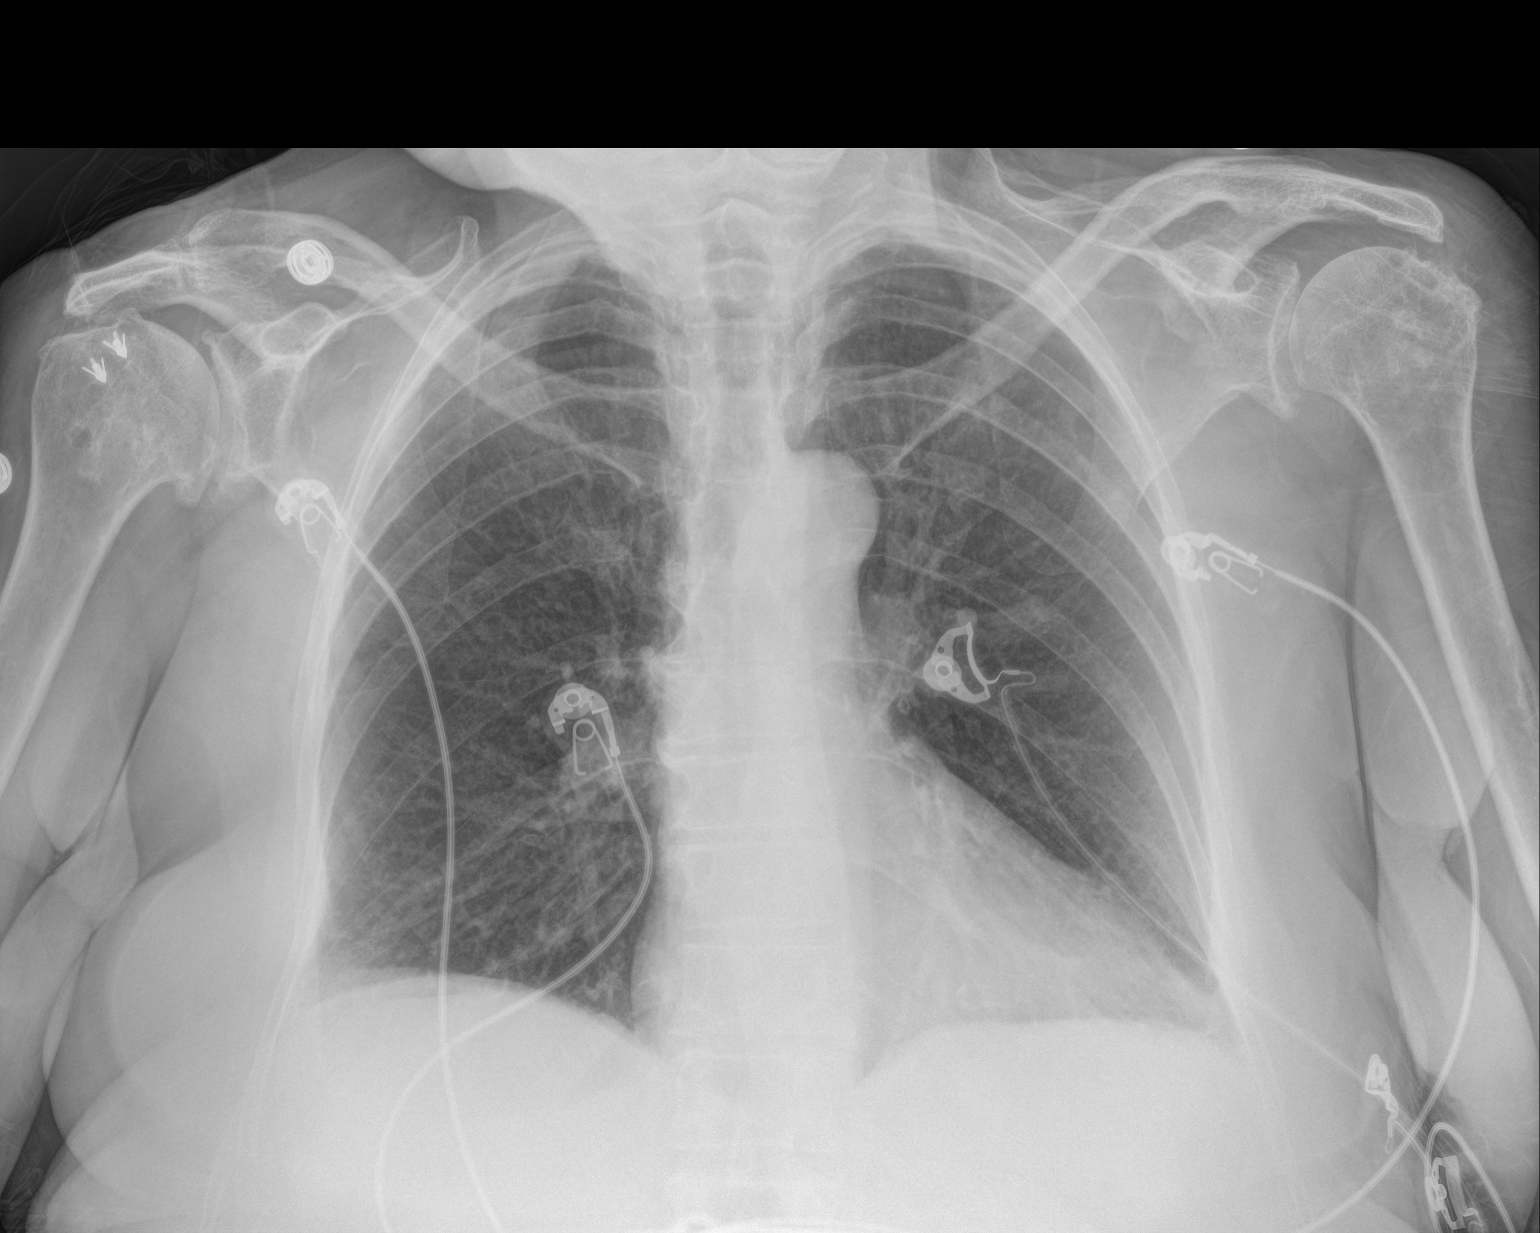

[1 of 1 positions shown; findings below may reference images not displayed]

FINDINGS: Suboptimal inspiration accounts for crowded bronchovascular
markings, especially in the bases, and accentuates the cardiac
silhouette. Taking this into account, cardiac silhouette mildly
enlarged, unchanged. Lungs clear. Bronchovascular markings normal.
Pulmonary vascularity normal. No visible pleural effusions. No
pneumothorax. Degenerative changes again noted in the shoulder
joints, right greater than left.
IMPRESSION: Suboptimal inspiration. Stable mild cardiomegaly. No acute
cardiopulmonary disease.

## 2016-09-12 IMAGING — CT CT HEAD W/O CM
2 series · 16 of 30 positions shown, 20 images · non-contrast
Comparison: CTs 06/08/2015 and 05/16/2015.

CLINICAL DATA: New onset of generalized weakness and altered mental
status. History of dementia.

EXAM:
CT HEAD WITHOUT CONTRAST
TECHNIQUE: Contiguous axial images were obtained from the base of the skull
through the vertex without intravenous contrast.

[Series 2: head w/o · axial · non-contrast · 0.45mm/px · z∈[-105,+15]mm · 13 of 30 slices shown, 17 images]
[im 3/30  brain]
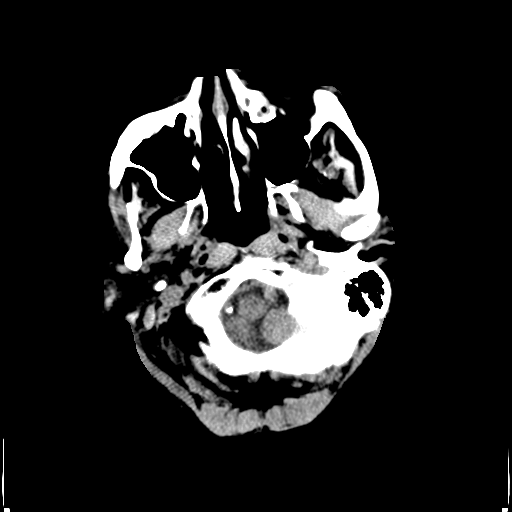
[im 3/30  bone]
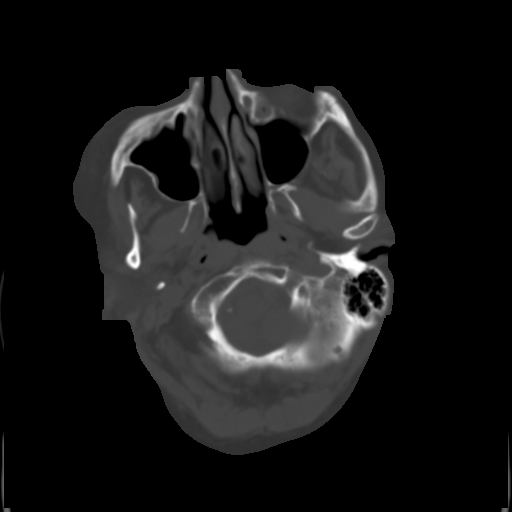
[im 5/30  brain]
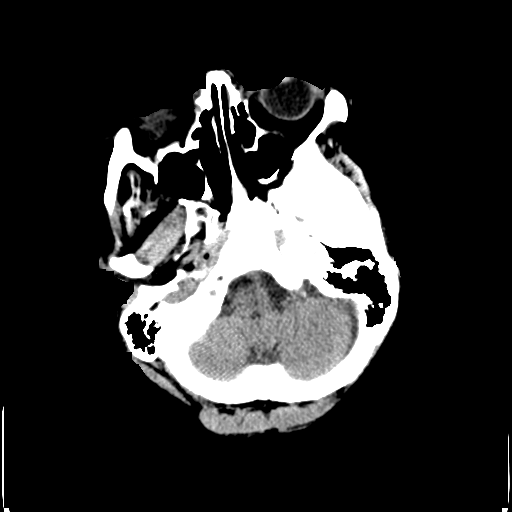
[im 7/30  brain]
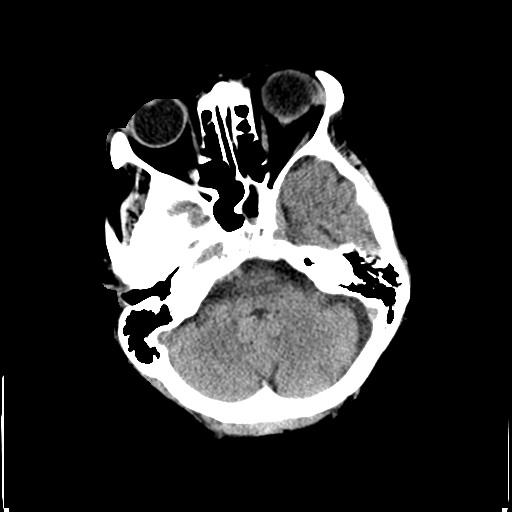
[im 9/30  brain]
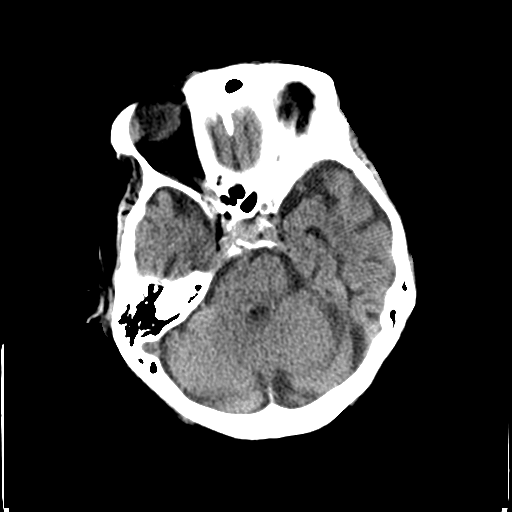
[im 11/30  brain]
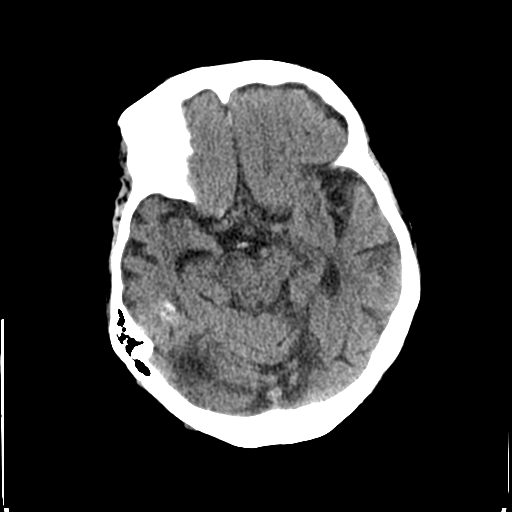
[im 11/30  bone]
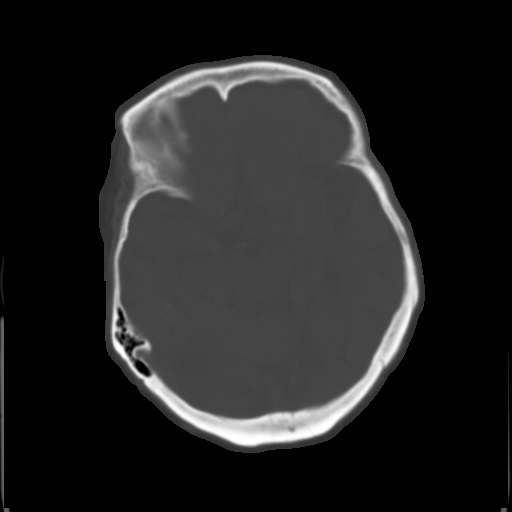
[im 13/30  brain]
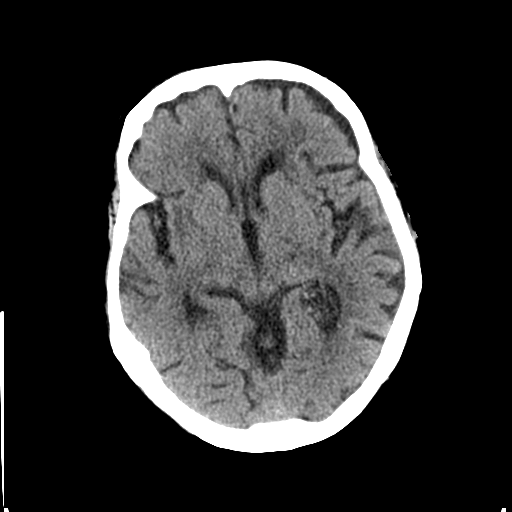
[im 15/30  brain]
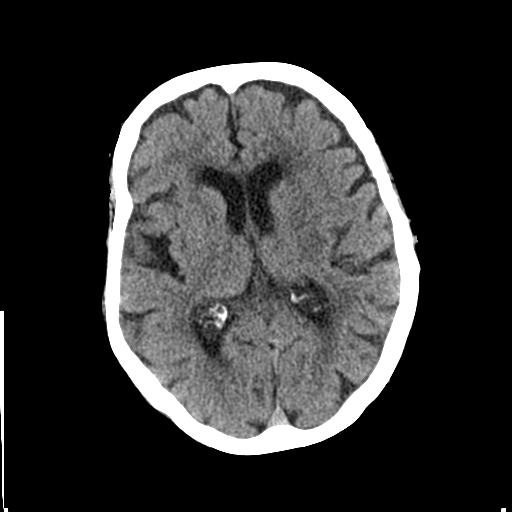
[im 17/30  brain]
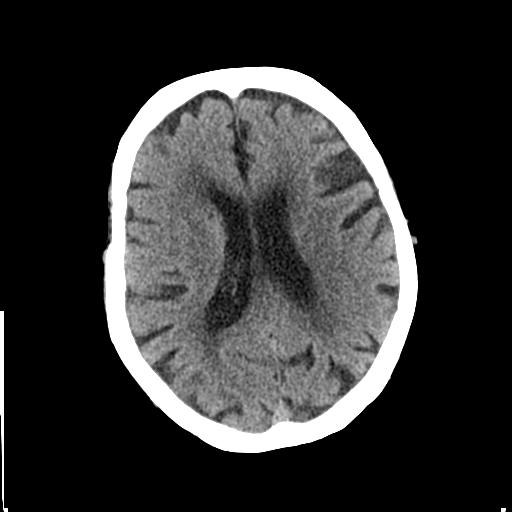
[im 19/30  brain]
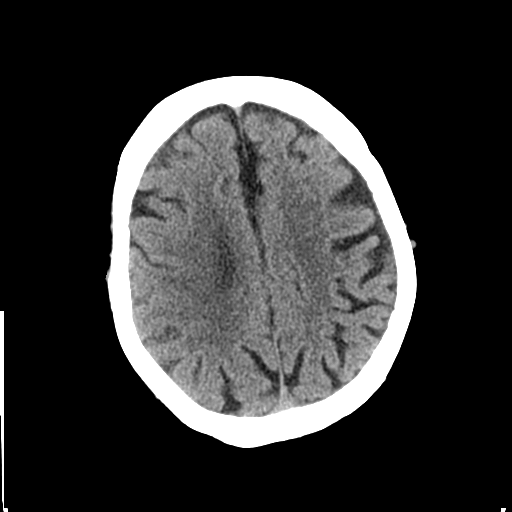
[im 19/30  bone]
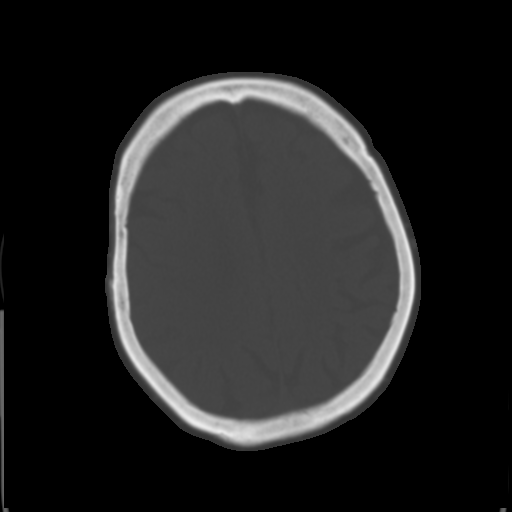
[im 21/30  brain]
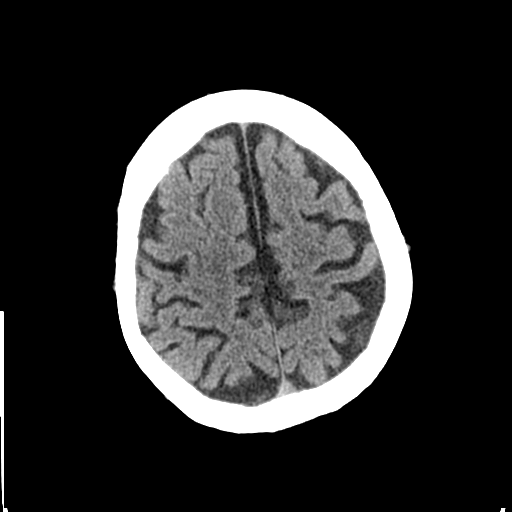
[im 23/30  brain]
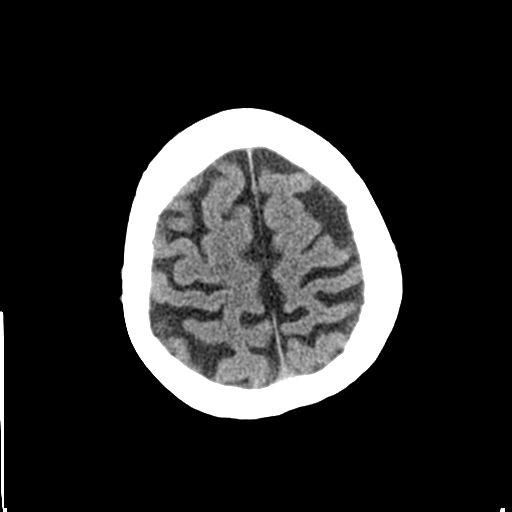
[im 25/30  brain]
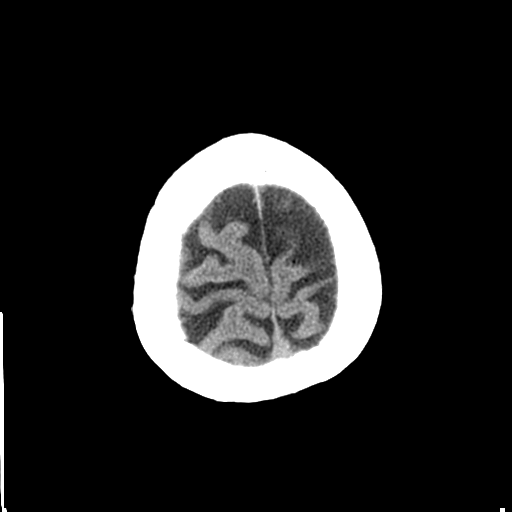
[im 27/30  brain]
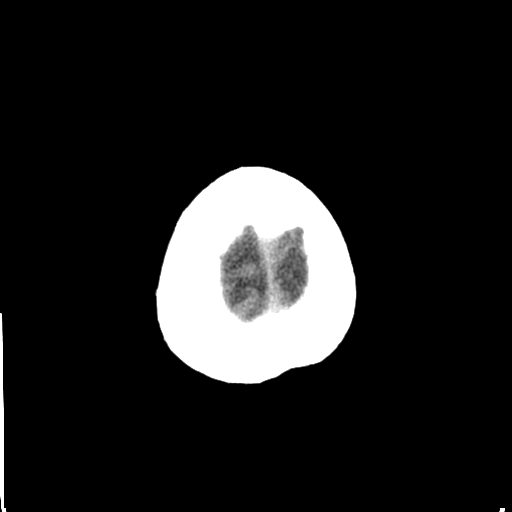
[im 27/30  bone]
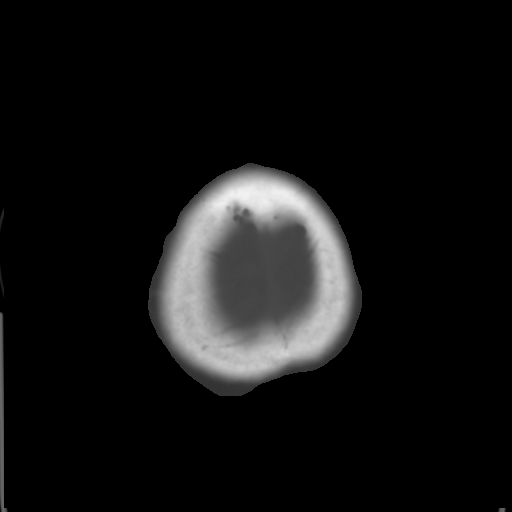

[Series 3: bone windows · axial · 0.45mm/px · z∈[-105,-65]mm · 3 of 30 slices shown]
[im 3/30  bone]
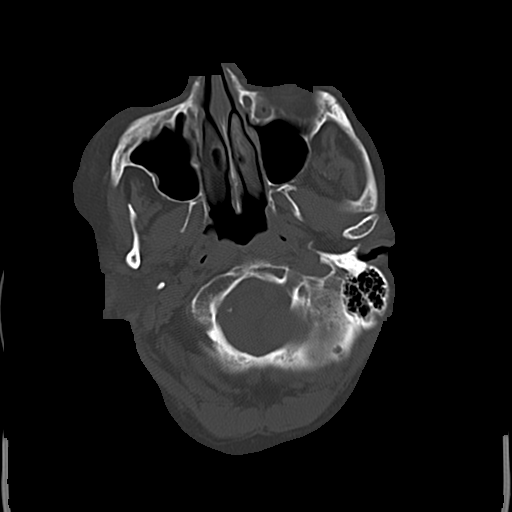
[im 7/30  bone]
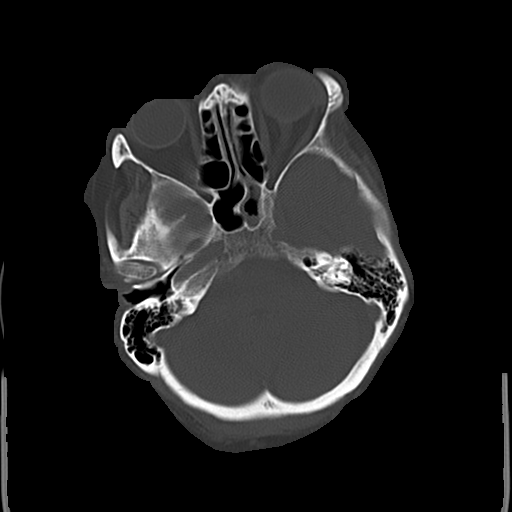
[im 11/30  bone]
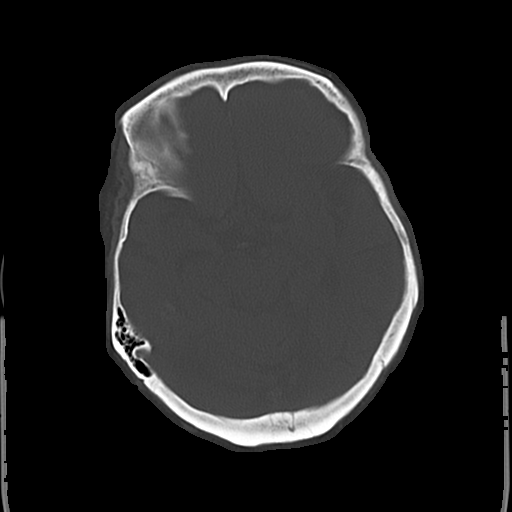

[16 of 30 positions shown; findings below may reference images not displayed]

FINDINGS: There is no evidence of acute intracranial hemorrhage, mass lesion,
brain edema or extra-axial fluid collection. The ventricles and
subarachnoid spaces are diffusely prominent but stated. There is no
CT evidence of acute cortical infarction. There is stable relatively
mild periventricular white matter disease.

The visualized paranasal sinuses, mastoid air cells and middle ears
are clear. The calvarium is intact.
IMPRESSION: Stable cerebral and cerebellar atrophy with mild periventricular
white matter disease. No acute intracranial findings.

## 2016-09-12 MED ORDER — ACETAMINOPHEN 325 MG PO TABS
650.0000 mg | ORAL_TABLET | Freq: Once | ORAL | Status: AC
  Administered 2016-09-12: 650 mg via ORAL
  Filled 2016-09-12: qty 2

## 2016-09-12 NOTE — ED Provider Notes (Signed)
MR negative for fracture. Discharge home. pcp follow up  Dg Thoracic Spine 2 View  Result Date: 09/12/2016 CLINICAL DATA:  Acute onset of upper back pain.  Initial encounter. EXAM: THORACIC SPINE 2 VIEWS COMPARISON:  Chest radiograph performed 06/22/2016 FINDINGS: There is no evidence of fracture or subluxation. Vertebral bodies demonstrate normal height and alignment. Intervertebral disc spaces are preserved. The visualized portions of both lungs are clear. The mediastinum is unremarkable in appearance. IMPRESSION: No evidence of fracture or subluxation along the thoracic spine. Electronically Signed   By: Roanna Raider M.D.   On: 09/12/2016 05:05   Ct Head Wo Contrast  Result Date: 09/12/2016 CLINICAL DATA:  Found down EXAM: CT HEAD WITHOUT CONTRAST CT CERVICAL SPINE WITHOUT CONTRAST TECHNIQUE: Multidetector CT imaging of the head and cervical spine was performed following the standard protocol without intravenous contrast. Multiplanar CT image reconstructions of the cervical spine were also generated. COMPARISON:  None. FINDINGS: CT HEAD FINDINGS Brain: No mass lesion, intraparenchymal hemorrhage or extra-axial collection. No evidence of acute cortical infarct. Brain parenchyma and CSF-containing spaces are normal for age. Vascular: No hyperdense vessel or unexpected calcification. Skull: There is mild left parietal scalp soft tissue swelling. Sinuses/Orbits: No sinus fluid levels or advanced mucosal thickening. No mastoid effusion. Normal orbits. CT CERVICAL SPINE FINDINGS Alignment: No static subluxation. Facets are aligned. Occipital condyles are normally positioned. Skull base and vertebrae: No acute fracture. Soft tissues and spinal canal: No prevertebral fluid or swelling. No visible canal hematoma. Disc levels: No advanced spinal canal or neural foraminal stenosis. Upper chest: No pneumothorax, pulmonary nodule or pleural effusion. Other: Normal visualized paraspinal cervical soft tissues.  IMPRESSION: 1. Mild left parietal scalp soft tissue swelling without underlying skull fracture or intracranial abnormality. 2. Findings of chronic microvascular ischemia. 3. No acute fracture or static subluxation of the cervical spine. Electronically Signed   By: Deatra Robinson M.D.   On: 09/12/2016 05:04   Ct Head Wo Contrast  Result Date: 08/27/2016 CLINICAL DATA:  Fall from toilet.  Dementia patient. EXAM: CT HEAD WITHOUT CONTRAST CT CERVICAL SPINE WITHOUT CONTRAST TECHNIQUE: Multidetector CT imaging of the head and cervical spine was performed following the standard protocol without intravenous contrast. Multiplanar CT image reconstructions of the cervical spine were also generated. COMPARISON:  Head and cervical spine CT 18 days prior 08/09/2016 FINDINGS: CT HEAD FINDINGS Brain: Stable atrophy and chronic small vessel ischemia. Remote lacunar infarct in left basal ganglia, unchanged. No acute hemorrhage or evidence of acute ischemia. No subdural fluid collection. No hydrocephalus, mass effect or midline shift. Vascular: Atherosclerosis of skullbase vasculature without hyperdense vessel or abnormal calcification. Skull: No skull fracture. Sinuses/Orbits: Stable fluid in left side of sphenoid sinus. Remaining included paranasal sinuses are clear. Mastoid air cells are well-aerated. Other: Diminished frontal scalp hematomas from prior exam. CT CERVICAL SPINE FINDINGS Alignment: Normal.  Patient's head is tilted in the scanner. Skull base and vertebrae: No acute fracture. Vertebral body heights are maintained. Skull base and dens are intact. Bony under mineralization. Soft tissues and spinal canal: No prevertebral fluid or swelling. No visible canal hematoma. Disc levels: Disc space narrowing and endplate spurring is most prominent at C5-C6 and C6-C7. Scattered facet arthropathy. Upper chest: No acute abnormality. Other: Carotid vascular calcifications. IMPRESSION: 1. Stable atrophy and chronic small vessel  ischemia without acute intracranial abnormality. No skull fracture. 2. Stable degenerative change in the cervical spine without acute fracture or subluxation. Electronically Signed   By: Rubye Oaks M.D.   On:  08/27/2016 22:31   Ct Cervical Spine Wo Contrast  Result Date: 09/12/2016 CLINICAL DATA:  Found down EXAM: CT HEAD WITHOUT CONTRAST CT CERVICAL SPINE WITHOUT CONTRAST TECHNIQUE: Multidetector CT imaging of the head and cervical spine was performed following the standard protocol without intravenous contrast. Multiplanar CT image reconstructions of the cervical spine were also generated. COMPARISON:  None. FINDINGS: CT HEAD FINDINGS Brain: No mass lesion, intraparenchymal hemorrhage or extra-axial collection. No evidence of acute cortical infarct. Brain parenchyma and CSF-containing spaces are normal for age. Vascular: No hyperdense vessel or unexpected calcification. Skull: There is mild left parietal scalp soft tissue swelling. Sinuses/Orbits: No sinus fluid levels or advanced mucosal thickening. No mastoid effusion. Normal orbits. CT CERVICAL SPINE FINDINGS Alignment: No static subluxation. Facets are aligned. Occipital condyles are normally positioned. Skull base and vertebrae: No acute fracture. Soft tissues and spinal canal: No prevertebral fluid or swelling. No visible canal hematoma. Disc levels: No advanced spinal canal or neural foraminal stenosis. Upper chest: No pneumothorax, pulmonary nodule or pleural effusion. Other: Normal visualized paraspinal cervical soft tissues. IMPRESSION: 1. Mild left parietal scalp soft tissue swelling without underlying skull fracture or intracranial abnormality. 2. Findings of chronic microvascular ischemia. 3. No acute fracture or static subluxation of the cervical spine. Electronically Signed   By: Deatra Robinson M.D.   On: 09/12/2016 05:04   Ct Cervical Spine Wo Contrast  Result Date: 08/27/2016 CLINICAL DATA:  Fall from toilet.  Dementia patient. EXAM:  CT HEAD WITHOUT CONTRAST CT CERVICAL SPINE WITHOUT CONTRAST TECHNIQUE: Multidetector CT imaging of the head and cervical spine was performed following the standard protocol without intravenous contrast. Multiplanar CT image reconstructions of the cervical spine were also generated. COMPARISON:  Head and cervical spine CT 18 days prior 08/09/2016 FINDINGS: CT HEAD FINDINGS Brain: Stable atrophy and chronic small vessel ischemia. Remote lacunar infarct in left basal ganglia, unchanged. No acute hemorrhage or evidence of acute ischemia. No subdural fluid collection. No hydrocephalus, mass effect or midline shift. Vascular: Atherosclerosis of skullbase vasculature without hyperdense vessel or abnormal calcification. Skull: No skull fracture. Sinuses/Orbits: Stable fluid in left side of sphenoid sinus. Remaining included paranasal sinuses are clear. Mastoid air cells are well-aerated. Other: Diminished frontal scalp hematomas from prior exam. CT CERVICAL SPINE FINDINGS Alignment: Normal.  Patient's head is tilted in the scanner. Skull base and vertebrae: No acute fracture. Vertebral body heights are maintained. Skull base and dens are intact. Bony under mineralization. Soft tissues and spinal canal: No prevertebral fluid or swelling. No visible canal hematoma. Disc levels: Disc space narrowing and endplate spurring is most prominent at C5-C6 and C6-C7. Scattered facet arthropathy. Upper chest: No acute abnormality. Other: Carotid vascular calcifications. IMPRESSION: 1. Stable atrophy and chronic small vessel ischemia without acute intracranial abnormality. No skull fracture. 2. Stable degenerative change in the cervical spine without acute fracture or subluxation. Electronically Signed   By: Rubye Oaks M.D.   On: 08/27/2016 22:31   Ct Hip Left Wo Contrast  Result Date: 09/12/2016 CLINICAL DATA:  Status post fall, with left hip pain. Initial encounter. EXAM: CT OF THE LEFT HIP WITHOUT CONTRAST TECHNIQUE:  Multidetector CT imaging of the left hip was performed according to the standard protocol. Multiplanar CT image reconstructions were also generated. COMPARISON:  Bilateral hip radiographs performed earlier today at 4:10 a.m. FINDINGS: Bones/Joint/Cartilage Cortical irregularity along the lateral aspect of the left femoral head appears new from prior studies in March and December. This raises suspicion for a poorly characterized subcapital fracture through  the left femoral neck. Mild osteophyte formation is noted about the left hip joint. The cartilage is not well assessed. No significant hip joint effusion is seen. Ligaments Suboptimally assessed by CT. Muscles and Tendons The visualized musculature is grossly unremarkable in appearance. No focal tendon abnormalities are seen. Soft tissues Note is made of a prominent focal soft tissue hematoma overlying the left greater femoral trochanter, measuring at least 8 cm in size, with associated soft tissue injury tracking along the proximal left thigh. The bladder is mildly distended. Visualized small and large bowel loops are grossly unremarkable. IMPRESSION: 1. Cortical irregularity along the lateral aspect of the left femoral head appears new from prior studies in March in December. This raises suspicion for a poorly characterized subcapital fracture through the left femoral neck. MRI could be considered as deemed clinically appropriate. 2. Mild osteophyte formation about the left hip joint. 3. Prominent focal soft tissue hematoma overlying the left greater femoral trochanter, measuring at least 8 cm in size, with associated soft tissue injury tracking along the proximal left thigh. Electronically Signed   By: Roanna Raider M.D.   On: 09/12/2016 06:46   Mr Hip Left Wo Contrast  Result Date: 09/12/2016 CLINICAL DATA:  Left hip pain due to a fall last night. Initial encounter. EXAM: MR OF THE LEFT HIP WITHOUT CONTRAST TECHNIQUE: Multiplanar, multisequence MR imaging  was performed. No intravenous contrast was administered. COMPARISON:  CT and plain films left hip this same day. FINDINGS: Bones: The left hip is located. No fracture is identified. Right hip replacement is noted. Articular cartilage and labrum Articular cartilage:  Mildly degenerated. Labrum:  Degenerated without tear. Joint or bursal effusion Joint effusion:  None. Bursae:  Negative. Muscles and tendons Muscles and tendons:  Intact. Other findings Miscellaneous: A large hematoma in the deep subcutaneous tissues over the left hip measures 9 cm craniocaudal by 5 cm AP by 4.5 cm transverse. IMPRESSION: Negative for fracture or dislocation Large subcutaneous hematoma over the left hip. Electronically Signed   By: Drusilla Kanner M.D.   On: 09/12/2016 09:37   Dg Knee Complete 4 Views Left  Result Date: 09/12/2016 CLINICAL DATA:  Acute onset of left knee pain. Patient found on floor. Initial encounter. EXAM: LEFT KNEE - COMPLETE 4+ VIEW COMPARISON:  Left knee radiographs performed 04/17/2016 FINDINGS: There is no evidence of fracture or dislocation. The joint spaces are preserved. No significant degenerative change is seen; the patellofemoral joint is grossly unremarkable in appearance. A fabella is noted. Scattered vascular calcifications are seen. A small knee joint effusion is noted. There is calcification of the menisci. IMPRESSION: 1. No evidence of fracture or dislocation. 2. Small knee joint effusion noted. 3. Scattered vascular calcifications seen. 4. Calcification of the menisci. Electronically Signed   By: Roanna Raider M.D.   On: 09/12/2016 04:53   Dg Knee Complete 4 Views Right  Result Date: 09/12/2016 CLINICAL DATA:  Acute onset of right knee pain. Found on floor. Initial encounter. EXAM: RIGHT KNEE - COMPLETE 4+ VIEW COMPARISON:  Right knee radiographs performed 04/17/2016 FINDINGS: There is no evidence of fracture or dislocation. The joint spaces are preserved. There is calcification at the  menisci. Scattered vascular calcifications are seen. No significant joint effusion is seen. The visualized soft tissues are otherwise unremarkable in appearance. IMPRESSION: 1. No evidence of fracture or dislocation. 2. Scattered vascular calcifications seen. 3. Calcification at the menisci. Electronically Signed   By: Roanna Raider M.D.   On: 09/12/2016 04:52  Dg Hips Bilat W Or Wo Pelvis 3-4 Views  Result Date: 09/12/2016 CLINICAL DATA:  Patient found on bathroom floor, with left hip pain. Initial encounter. EXAM: DG HIP (WITH OR WITHOUT PELVIS) 3-4V BILAT COMPARISON:  Bilateral hip radiographs performed 04/17/2016 FINDINGS: There is question of minimal cortical irregularity about the left femoral head. Fracture cannot be entirely excluded. The patient's right hip arthroplasty is grossly unremarkable, without evidence of loosening. The proximal femurs appear intact. Mild degenerative change is noted along the lower lumbar spine. The sacroiliac joints are unremarkable in appearance. The visualized bowel gas pattern is grossly unremarkable in appearance. IMPRESSION: 1. Question of minimal cortical irregularity about the left femoral head. Fracture cannot be entirely excluded. MRI or CT could be considered for further evaluation, if the patient's symptoms persist. 2. Right hip arthroplasty is grossly unremarkable, without evidence of loosening. Electronically Signed   By: Roanna Raider M.D.   On: 09/12/2016 05:04      Deborah Bilis, MD 09/12/16 [id number]

## 2016-09-12 NOTE — ED Notes (Signed)
Patient transported to X-ray 

## 2016-09-12 NOTE — ED Notes (Signed)
Patient transported to MRI 

## 2016-09-12 NOTE — ED Triage Notes (Signed)
Pt comes to ed via ems, found on bathroom floor, fall small hematoma to left head temporal area. Hx of falls and dementia. No loc and no blood thinners verbalized by ems. Pt comes from [facility]. V/s on arrival 126/80 pulse 60,rr 16.

## 2016-09-12 NOTE — ED Notes (Signed)
Bed: ZO10 Expected date:  Expected time:  Means of arrival:  Comments: Fall, hematoma

## 2016-09-12 NOTE — ED Notes (Addendum)
Pt unable to sign for discharge instructions due to disorientation level. Report given to Alvira Philips, the medication tech at [facility].

## 2016-09-12 NOTE — ED Notes (Signed)
Pt in CT/xray

## 2016-09-12 NOTE — ED Provider Notes (Signed)
[facility]-EMERGENCY DEPT Provider Note   CSN: [id number] Arrival date & time: 09/12/16  4098 By signing my name below, I, [name], attest that this documentation has been prepared under the direction and in the presence of Pricilla Loveless, MD . Electronically Signed: [name], Scribe. 09/12/2016. 3:53 AM.   History   Chief Complaint Chief Complaint  Patient presents with  . Fall    hematoma    LEVEL 5 CAVEAT IN HPI AND ROS DUE TO DEMENTIA   HPI [name] is a 83 y.o. female with a hx of dementia and recurrent falls who presents to the Emergency Department s/p fall tonight with no complaints. Per EMS, pt was found on the bathroom floor with a small hematoma to her left parietal. Pt is not on any anti-coagulants. No LOC.   The history is provided by the patient and the EMS personnel. The history is limited by the condition of the patient. No language interpreter was used.   Past Medical History:  Diagnosis Date  . Acute bronchitis 04/10/2013  . Anemia   . Angina   . Anxiety   . Arthritis   . Arthritis 10/02/2012  . Asthma   . Chicken pox   . Chronic kidney disease   . Colon polyps   . Dehydration 08/31/2013  . Dementia   . Depression   . Dysrhythmia   . Headache(784.0)   . Heart murmur   . Hypertension   . Hypothyroidism   . Left leg pain 10/02/2012  . Pneumonia   . Recurrent upper respiratory infection (URI)   . Shortness of breath   . UTI (urinary tract infection) 12/30/2012  . Valvular heart disease 09/04/2012    Patient Active Problem List   Diagnosis Date Noted  . Pressure ulcer 09/20/2015  . Displaced fracture of right femoral neck (HCC) 09/19/2015  . Altered mental status   . Confusion 06/26/2015  . Fall   . Aspiration pneumonia (HCC) 01/10/2015  . Acute encephalopathy 01/08/2015  . UTI (urinary tract infection) 01/08/2015  . 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 Recurrent falls 11/27/2014  . FTT (failure to thrive) in adult  10/26/2014  . Dehydration 10/26/2014  . Abnormal urinalysis 10/26/2014  . Normocytic anemia 10/26/2014  . Lump of skin of left upper extremity 09/07/2014  . Rash and nonspecific skin eruption 05/08/2014  . Syncope and collapse 11/30/2013  . Low back pain, episodic 11/20/2013  . Chronic kidney disease   . Insomnia 08/31/2013  . Arthritis 10/02/2012  . Valvular heart disease 09/04/2012  . Restless leg syndrome 06/06/2012  . Trochanteric bursitis 08/02/2011  . Dementia 08/02/2011  . Leg pain 03/28/2011  . Hypertension 09/22/2010  . Hypothyroidism 09/22/2010    Past Surgical History:  Procedure Laterality Date  . ABDOMINAL HYSTERECTOMY    . APPENDECTOMY    . BACK SURGERY    . CHOLECYSTECTOMY    . COLONOSCOPY W/ POLYPECTOMY    . EYE SURGERY     cataract removed and eye lids lifted  . feet surgery    . FRACTURE SURGERY     bilateral arms  . HAND SURGERY    . HIP ARTHROPLASTY Right 09/20/2015   Procedure: ARTHROPLASTY RIGHT  BIPOLAR ANTERIOR HIP (HEMIARTHROPLASTY);  Surgeon: Samson Frederic, MD;  Location: [facility];  Service: Orthopedics;  Laterality: Right;  . kidney stones    . SHOULDER ARTHROSCOPY    . TONSILLECTOMY    . TONSILLECTOMY    . TUBAL LIGATION  OB History    No data available      Home Medications    Prior to Admission medications   Medication Sig Start Date End Date Taking? Authorizing Provider  acetaminophen (TYLENOL) 500 MG tablet Take 500 mg by mouth See admin instructions. Take 1 caplet by mouth every 4 hours as needed for 24 hours for headache, minor discomfort, fever - do not exceed 2000 mg in 2 hours   Yes Historical Provider, MD  alum & mag hydroxide-simeth (MINTOX) 200-200-20 MG/5ML suspension Take 30 mLs by mouth every 6 (six) hours as needed for indigestion or heartburn.    Yes Historical Provider, MD  aspirin 81 MG chewable tablet Chew 81 mg by mouth daily.   Yes Historical Provider, MD  clonazePAM (KLONOPIN) 0.5 MG tablet Take 1 tablet (0.5  mg total) by mouth 2 (two) times daily. Patient taking differently: Take 0.25-0.5 mg by mouth See admin instructions. Take 1/2 tablet (0.25 mg) by mouth every morning and 1 tablet at 2 PM and 1 tablet at 8 PM 09/23/15  Yes Elease Etienne, MD  divalproex (DEPAKOTE SPRINKLE) 125 MG capsule Take 125 mg by mouth 3 (three) times daily. 8am, 2pm, 8pm   Yes Historical Provider, MD  escitalopram (LEXAPRO) 5 MG tablet Take 5 mg by mouth daily.   Yes Historical Provider, MD  guaifenesin (ROBITUSSIN) 100 MG/5ML syrup Take 200 mg by mouth every 6 (six) hours as needed for cough.    Yes Historical Provider, MD  levothyroxine (SYNTHROID, LEVOTHROID) 50 MCG tablet Take 50 mcg by mouth daily at 6 (six) AM.    Yes Historical Provider, MD  loperamide (IMODIUM) 2 MG capsule Take 2 mg by mouth as needed for diarrhea or loose stools.    Yes Historical Provider, MD  magnesium hydroxide (MILK OF MAGNESIA) 400 MG/5ML suspension Take 30 mLs by mouth at bedtime as needed for mild constipation.   Yes Historical Provider, MD  Multiple Vitamins-Minerals (MULTIVITAMIN WITH MINERALS) tablet Take 1 tablet by mouth daily.   Yes Historical Provider, MD  Neomycin-Bacitracin-Polymyxin (TRIPLE ANTIBIOTIC) [phone] OINT Apply topically as needed (for Skin tear and abrasions).   Yes Historical Provider, MD  Nutritional Supplements (NUTRITIONAL DRINK) LIQD Take 1 Bottle by mouth 3 (three) times daily with meals. Mighty Shakes   Yes Historical Provider, MD  rivastigmine (EXELON) 1.5 MG capsule Take 1.5 mg by mouth 2 (two) times daily.   Yes Historical Provider, MD  senna (SENOKOT) 8.6 MG TABS tablet Take 2 tablets by mouth 2 (two) times daily.   Yes Historical Provider, MD  traMADol (ULTRAM) 50 MG tablet Take 1 tablet (50 mg total) by mouth every 6 (six) hours as needed for severe pain. Patient taking differently: Take 50 mg by mouth See admin instructions. Take 1 tablet (50 mg) by mouth twice daily, may also take 1 tablet every 6 hours  as needed for moderate or severe pain 09/23/15  Yes Elease Etienne, MD  traZODone (DESYREL) 50 MG tablet Take 50 mg by mouth at bedtime.    Yes Historical Provider, MD    Family History Family History  Problem Relation Age of Onset  . Heart disease Mother   . Heart disease Father   . Heart disease Other   . Birth defects Other     Social History Social History  Substance Use Topics  . Smoking status: Former Smoker    Packs/day: 0.20    Years: 1.00    Types: Cigarettes    Quit date:  06/02/1941  . Smokeless tobacco: Never Used  . Alcohol use No     Allergies   Morphine and related; Codeine; and Hydrocodone   Review of Systems Review of Systems  Unable to perform ROS: Dementia   Physical Exam Updated Vital Signs BP (!) 175/65 (BP Location: Left Arm)   Pulse 62   Temp 97.6 F (36.4 C) (Oral)   Resp 15   SpO2 97%   Physical Exam  Constitutional: She appears well-developed and well-nourished.  HENT:  Head: Normocephalic.  Right Ear: External ear normal.  Left Ear: External ear normal.  Nose: Nose normal.  Small left parietal hematoma with tenderness  Eyes: Pupils are equal, round, and reactive to light. Right eye exhibits no discharge. Left eye exhibits no discharge.  Neck: Neck supple.  Mild neck tenderness  Cardiovascular: Normal rate, regular rhythm and normal heart sounds.   Pulmonary/Chest: Effort normal and breath sounds normal.  Abdominal: Soft. There is no tenderness.  Musculoskeletal:  Point tenderness in mid-thoracic spine.  Diffuse pain in lower extremities. Difficult to reproduce. No joint or extremity swelling.  Neurological: She is alert.  Awake, alert, but disoriented. Grossly moves all 4 extremities.   Skin: Skin is warm and dry.  Nursing note and vitals reviewed.  ED Treatments / Results  DIAGNOSTIC STUDIES:  Oxygen Saturation is 95% on RA, normal by my interpretation.    COORDINATION OF CARE:  3:51 AM Discussed treatment plan with pt  at bedside and pt agreed to plan.  Labs (all labs ordered are listed, but only abnormal results are displayed) Labs Reviewed  CBC WITH DIFFERENTIAL/PLATELET  BASIC METABOLIC PANEL    EKG  EKG Interpretation None       Radiology Dg Thoracic Spine 2 View  Result Date: 09/12/2016 CLINICAL DATA:  Acute onset of upper back pain.  Initial encounter. EXAM: THORACIC SPINE 2 VIEWS COMPARISON:  Chest radiograph performed 06/22/2016 FINDINGS: There is no evidence of fracture or subluxation. Vertebral bodies demonstrate normal height and alignment. Intervertebral disc spaces are preserved. The visualized portions of both lungs are clear. The mediastinum is unremarkable in appearance. IMPRESSION: No evidence of fracture or subluxation along the thoracic spine. Electronically Signed   By: Roanna Raider M.D.   On: 09/12/2016 05:05   Ct Head Wo Contrast  Result Date: 09/12/2016 CLINICAL DATA:  Found down EXAM: CT HEAD WITHOUT CONTRAST CT CERVICAL SPINE WITHOUT CONTRAST TECHNIQUE: Multidetector CT imaging of the head and cervical spine was performed following the standard protocol without intravenous contrast. Multiplanar CT image reconstructions of the cervical spine were also generated. COMPARISON:  None. FINDINGS: CT HEAD FINDINGS Brain: No mass lesion, intraparenchymal hemorrhage or extra-axial collection. No evidence of acute cortical infarct. Brain parenchyma and CSF-containing spaces are normal for age. Vascular: No hyperdense vessel or unexpected calcification. Skull: There is mild left parietal scalp soft tissue swelling. Sinuses/Orbits: No sinus fluid levels or advanced mucosal thickening. No mastoid effusion. Normal orbits. CT CERVICAL SPINE FINDINGS Alignment: No static subluxation. Facets are aligned. Occipital condyles are normally positioned. Skull base and vertebrae: No acute fracture. Soft tissues and spinal canal: No prevertebral fluid or swelling. No visible canal hematoma. Disc levels: No  advanced spinal canal or neural foraminal stenosis. Upper chest: No pneumothorax, pulmonary nodule or pleural effusion. Other: Normal visualized paraspinal cervical soft tissues. IMPRESSION: 1. Mild left parietal scalp soft tissue swelling without underlying skull fracture or intracranial abnormality. 2. Findings of chronic microvascular ischemia. 3. No acute fracture or static subluxation of the  cervical spine. Electronically Signed   By: Deatra Robinson M.D.   On: 09/12/2016 05:04   Ct Cervical Spine Wo Contrast  Result Date: 09/12/2016 CLINICAL DATA:  Found down EXAM: CT HEAD WITHOUT CONTRAST CT CERVICAL SPINE WITHOUT CONTRAST TECHNIQUE: Multidetector CT imaging of the head and cervical spine was performed following the standard protocol without intravenous contrast. Multiplanar CT image reconstructions of the cervical spine were also generated. COMPARISON:  None. FINDINGS: CT HEAD FINDINGS Brain: No mass lesion, intraparenchymal hemorrhage or extra-axial collection. No evidence of acute cortical infarct. Brain parenchyma and CSF-containing spaces are normal for age. Vascular: No hyperdense vessel or unexpected calcification. Skull: There is mild left parietal scalp soft tissue swelling. Sinuses/Orbits: No sinus fluid levels or advanced mucosal thickening. No mastoid effusion. Normal orbits. CT CERVICAL SPINE FINDINGS Alignment: No static subluxation. Facets are aligned. Occipital condyles are normally positioned. Skull base and vertebrae: No acute fracture. Soft tissues and spinal canal: No prevertebral fluid or swelling. No visible canal hematoma. Disc levels: No advanced spinal canal or neural foraminal stenosis. Upper chest: No pneumothorax, pulmonary nodule or pleural effusion. Other: Normal visualized paraspinal cervical soft tissues. IMPRESSION: 1. Mild left parietal scalp soft tissue swelling without underlying skull fracture or intracranial abnormality. 2. Findings of chronic microvascular ischemia. 3.  No acute fracture or static subluxation of the cervical spine. Electronically Signed   By: Deatra Robinson M.D.   On: 09/12/2016 05:04   Ct Hip Left Wo Contrast  Result Date: 09/12/2016 CLINICAL DATA:  Status post fall, with left hip pain. Initial encounter. EXAM: CT OF THE LEFT HIP WITHOUT CONTRAST TECHNIQUE: Multidetector CT imaging of the left hip was performed according to the standard protocol. Multiplanar CT image reconstructions were also generated. COMPARISON:  Bilateral hip radiographs performed earlier today at 4:10 a.m. FINDINGS: Bones/Joint/Cartilage Cortical irregularity along the lateral aspect of the left femoral head appears new from prior studies in March and December. This raises suspicion for a poorly characterized subcapital fracture through the left femoral neck. Mild osteophyte formation is noted about the left hip joint. The cartilage is not well assessed. No significant hip joint effusion is seen. Ligaments Suboptimally assessed by CT. Muscles and Tendons The visualized musculature is grossly unremarkable in appearance. No focal tendon abnormalities are seen. Soft tissues Note is made of a prominent focal soft tissue hematoma overlying the left greater femoral trochanter, measuring at least 8 cm in size, with associated soft tissue injury tracking along the proximal left thigh. The bladder is mildly distended. Visualized small and large bowel loops are grossly unremarkable. IMPRESSION: 1. Cortical irregularity along the lateral aspect of the left femoral head appears new from prior studies in March in December. This raises suspicion for a poorly characterized subcapital fracture through the left femoral neck. MRI could be considered as deemed clinically appropriate. 2. Mild osteophyte formation about the left hip joint. 3. Prominent focal soft tissue hematoma overlying the left greater femoral trochanter, measuring at least 8 cm in size, with associated soft tissue injury tracking along the  proximal left thigh. Electronically Signed   By: Roanna Raider M.D.   On: 09/12/2016 06:46   Dg Knee Complete 4 Views Left  Result Date: 09/12/2016 CLINICAL DATA:  Acute onset of left knee pain. Patient found on floor. Initial encounter. EXAM: LEFT KNEE - COMPLETE 4+ VIEW COMPARISON:  Left knee radiographs performed 04/17/2016 FINDINGS: There is no evidence of fracture or dislocation. The joint spaces are preserved. No significant degenerative change is seen; the  patellofemoral joint is grossly unremarkable in appearance. A fabella is noted. Scattered vascular calcifications are seen. A small knee joint effusion is noted. There is calcification of the menisci. IMPRESSION: 1. No evidence of fracture or dislocation. 2. Small knee joint effusion noted. 3. Scattered vascular calcifications seen. 4. Calcification of the menisci. Electronically Signed   By: Roanna Raider M.D.   On: 09/12/2016 04:53   Dg Knee Complete 4 Views Right  Result Date: 09/12/2016 CLINICAL DATA:  Acute onset of right knee pain. Found on floor. Initial encounter. EXAM: RIGHT KNEE - COMPLETE 4+ VIEW COMPARISON:  Right knee radiographs performed 04/17/2016 FINDINGS: There is no evidence of fracture or dislocation. The joint spaces are preserved. There is calcification at the menisci. Scattered vascular calcifications are seen. No significant joint effusion is seen. The visualized soft tissues are otherwise unremarkable in appearance. IMPRESSION: 1. No evidence of fracture or dislocation. 2. Scattered vascular calcifications seen. 3. Calcification at the menisci. Electronically Signed   By: Roanna Raider M.D.   On: 09/12/2016 04:52   Dg Hips Bilat W Or Wo Pelvis 3-4 Views  Result Date: 09/12/2016 CLINICAL DATA:  Patient found on bathroom floor, with left hip pain. Initial encounter. EXAM: DG HIP (WITH OR WITHOUT PELVIS) 3-4V BILAT COMPARISON:  Bilateral hip radiographs performed 04/17/2016 FINDINGS: There is question of minimal cortical  irregularity about the left femoral head. Fracture cannot be entirely excluded. The patient's right hip arthroplasty is grossly unremarkable, without evidence of loosening. The proximal femurs appear intact. Mild degenerative change is noted along the lower lumbar spine. The sacroiliac joints are unremarkable in appearance. The visualized bowel gas pattern is grossly unremarkable in appearance. IMPRESSION: 1. Question of minimal cortical irregularity about the left femoral head. Fracture cannot be entirely excluded. MRI or CT could be considered for further evaluation, if the patient's symptoms persist. 2. Right hip arthroplasty is grossly unremarkable, without evidence of loosening. Electronically Signed   By: Roanna Raider M.D.   On: 09/12/2016 05:04    Procedures Procedures (including critical care time)  Medications Ordered in ED Medications  acetaminophen (TYLENOL) tablet 650 mg (650 mg Oral Given 09/12/16 0542)     Initial Impression / Assessment and Plan / ED Course  I have reviewed the triage vital signs and the nursing notes.  Pertinent labs & imaging results that were available during my care of the patient were reviewed by me and considered in my medical decision making (see chart for details).     During ED stay, left lateral hip has been swelling with a hematoma. Xray with possible fracture. CT with possible fracture. Discussed with Dr. Linna Caprice, asks for MRI to see if there is edema and where it is. Care transferred to Dr. Patria Mane with MRI pending. Other CTs/xrays unremarkable.   Final Clinical Impressions(s) / ED Diagnoses   Final diagnoses:  Fall, initial encounter    New Prescriptions New Prescriptions   No medications on file    I personally performed the services described in this documentation, which was scribed in my presence. The recorded information has been reviewed and is accurate.    Pricilla Loveless, MD 09/12/16 [phone]

## 2016-09-13 DIAGNOSIS — G0430 Acute necrotizing hemorrhagic encephalopathy, unspecified: Secondary | ICD-10-CM | POA: Diagnosis not present

## 2016-09-14 DIAGNOSIS — G0430 Acute necrotizing hemorrhagic encephalopathy, unspecified: Secondary | ICD-10-CM | POA: Diagnosis not present

## 2016-09-15 ENCOUNTER — Inpatient Hospital Stay (HOSPITAL_COMMUNITY)
Admission: EM | Admit: 2016-09-15 | Discharge: 2016-09-18 | DRG: 602 | Disposition: A | Discharge status: Skilled Nursing Facility | Payer: Medicare HMO | Attending: Internal Medicine | Admitting: Internal Medicine

## 2016-09-15 ENCOUNTER — Encounter (HOSPITAL_COMMUNITY): Payer: Self-pay

## 2016-09-15 DIAGNOSIS — Z7982 Long term (current) use of aspirin: Secondary | ICD-10-CM

## 2016-09-15 DIAGNOSIS — E876 Hypokalemia: Secondary | ICD-10-CM | POA: Diagnosis not present

## 2016-09-15 DIAGNOSIS — Z96641 Presence of right artificial hip joint: Secondary | ICD-10-CM | POA: Diagnosis present

## 2016-09-15 DIAGNOSIS — Z66 Do not resuscitate: Secondary | ICD-10-CM | POA: Diagnosis present

## 2016-09-15 DIAGNOSIS — S92909A Unspecified fracture of unspecified foot, initial encounter for closed fracture: Secondary | ICD-10-CM | POA: Diagnosis not present

## 2016-09-15 DIAGNOSIS — D649 Anemia, unspecified: Secondary | ICD-10-CM | POA: Diagnosis present

## 2016-09-15 DIAGNOSIS — Z885 Allergy status to narcotic agent status: Secondary | ICD-10-CM | POA: Diagnosis not present

## 2016-09-15 DIAGNOSIS — F0280 Dementia in other diseases classified elsewhere without behavioral disturbance: Secondary | ICD-10-CM | POA: Diagnosis present

## 2016-09-15 DIAGNOSIS — Z87891 Personal history of nicotine dependence: Secondary | ICD-10-CM | POA: Diagnosis not present

## 2016-09-15 DIAGNOSIS — S82301A Unspecified fracture of lower end of right tibia, initial encounter for closed fracture: Secondary | ICD-10-CM | POA: Diagnosis not present

## 2016-09-15 DIAGNOSIS — Z9049 Acquired absence of other specified parts of digestive tract: Secondary | ICD-10-CM

## 2016-09-15 DIAGNOSIS — N189 Chronic kidney disease, unspecified: Secondary | ICD-10-CM | POA: Diagnosis not present

## 2016-09-15 DIAGNOSIS — S82209A Unspecified fracture of shaft of unspecified tibia, initial encounter for closed fracture: Secondary | ICD-10-CM

## 2016-09-15 DIAGNOSIS — G0430 Acute necrotizing hemorrhagic encephalopathy, unspecified: Secondary | ICD-10-CM | POA: Diagnosis not present

## 2016-09-15 DIAGNOSIS — M25562 Pain in left knee: Secondary | ICD-10-CM | POA: Diagnosis not present

## 2016-09-15 DIAGNOSIS — E039 Hypothyroidism, unspecified: Secondary | ICD-10-CM | POA: Diagnosis present

## 2016-09-15 DIAGNOSIS — S299XXA Unspecified injury of thorax, initial encounter: Secondary | ICD-10-CM | POA: Diagnosis not present

## 2016-09-15 DIAGNOSIS — S82202A Unspecified fracture of shaft of left tibia, initial encounter for closed fracture: Secondary | ICD-10-CM | POA: Diagnosis not present

## 2016-09-15 DIAGNOSIS — F05 Delirium due to known physiological condition: Secondary | ICD-10-CM | POA: Diagnosis not present

## 2016-09-15 DIAGNOSIS — L03116 Cellulitis of left lower limb: Principal | ICD-10-CM | POA: Diagnosis present

## 2016-09-15 DIAGNOSIS — G9340 Encephalopathy, unspecified: Secondary | ICD-10-CM | POA: Diagnosis present

## 2016-09-15 DIAGNOSIS — I129 Hypertensive chronic kidney disease with stage 1 through stage 4 chronic kidney disease, or unspecified chronic kidney disease: Secondary | ICD-10-CM | POA: Diagnosis not present

## 2016-09-15 DIAGNOSIS — M79662 Pain in left lower leg: Secondary | ICD-10-CM | POA: Diagnosis not present

## 2016-09-15 DIAGNOSIS — D72821 Monocytosis (symptomatic): Secondary | ICD-10-CM | POA: Diagnosis not present

## 2016-09-15 DIAGNOSIS — W19XXXA Unspecified fall, initial encounter: Secondary | ICD-10-CM | POA: Diagnosis not present

## 2016-09-15 DIAGNOSIS — Z8249 Family history of ischemic heart disease and other diseases of the circulatory system: Secondary | ICD-10-CM

## 2016-09-15 DIAGNOSIS — R296 Repeated falls: Secondary | ICD-10-CM | POA: Diagnosis not present

## 2016-09-15 DIAGNOSIS — D72829 Elevated white blood cell count, unspecified: Secondary | ICD-10-CM | POA: Diagnosis not present

## 2016-09-15 DIAGNOSIS — S82201A Unspecified fracture of shaft of right tibia, initial encounter for closed fracture: Secondary | ICD-10-CM | POA: Diagnosis not present

## 2016-09-15 DIAGNOSIS — S8992XA Unspecified injury of left lower leg, initial encounter: Secondary | ICD-10-CM | POA: Diagnosis not present

## 2016-09-15 DIAGNOSIS — M1990 Unspecified osteoarthritis, unspecified site: Secondary | ICD-10-CM | POA: Diagnosis not present

## 2016-09-15 DIAGNOSIS — S8261XA Displaced fracture of lateral malleolus of right fibula, initial encounter for closed fracture: Secondary | ICD-10-CM | POA: Diagnosis not present

## 2016-09-15 DIAGNOSIS — Z90710 Acquired absence of both cervix and uterus: Secondary | ICD-10-CM

## 2016-09-15 DIAGNOSIS — M79605 Pain in left leg: Secondary | ICD-10-CM | POA: Diagnosis not present

## 2016-09-15 DIAGNOSIS — J189 Pneumonia, unspecified organism: Secondary | ICD-10-CM | POA: Diagnosis present

## 2016-09-15 DIAGNOSIS — S82402A Unspecified fracture of shaft of left fibula, initial encounter for closed fracture: Secondary | ICD-10-CM | POA: Diagnosis present

## 2016-09-15 DIAGNOSIS — S82401A Unspecified fracture of shaft of right fibula, initial encounter for closed fracture: Secondary | ICD-10-CM | POA: Diagnosis not present

## 2016-09-15 DIAGNOSIS — I10 Essential (primary) hypertension: Secondary | ICD-10-CM | POA: Diagnosis not present

## 2016-09-15 DIAGNOSIS — G309 Alzheimer's disease, unspecified: Secondary | ICD-10-CM | POA: Diagnosis present

## 2016-09-15 DIAGNOSIS — F0281 Dementia in other diseases classified elsewhere with behavioral disturbance: Secondary | ICD-10-CM | POA: Diagnosis not present

## 2016-09-15 DIAGNOSIS — G9341 Metabolic encephalopathy: Secondary | ICD-10-CM | POA: Diagnosis not present

## 2016-09-15 DIAGNOSIS — S8990XA Unspecified injury of unspecified lower leg, initial encounter: Secondary | ICD-10-CM | POA: Diagnosis not present

## 2016-09-15 DIAGNOSIS — T148XXA Other injury of unspecified body region, initial encounter: Secondary | ICD-10-CM | POA: Diagnosis not present

## 2016-09-15 DIAGNOSIS — E038 Other specified hypothyroidism: Secondary | ICD-10-CM | POA: Diagnosis not present

## 2016-09-15 NOTE — ED Notes (Signed)
Bed: WA21 Expected date:  Expected time:  Means of arrival:  Comments: 83 yr old fall, leg pain

## 2016-09-15 NOTE — ED Notes (Signed)
Pt had an unwitnessed fall at [facility], pt only complains of leg pain

## 2016-09-16 ENCOUNTER — Inpatient Hospital Stay (HOSPITAL_COMMUNITY): Payer: Medicare HMO

## 2016-09-16 ENCOUNTER — Emergency Department (HOSPITAL_COMMUNITY): Payer: Medicare HMO

## 2016-09-16 DIAGNOSIS — S82402A Unspecified fracture of shaft of left fibula, initial encounter for closed fracture: Secondary | ICD-10-CM

## 2016-09-16 DIAGNOSIS — D649 Anemia, unspecified: Secondary | ICD-10-CM | POA: Diagnosis present

## 2016-09-16 DIAGNOSIS — D72821 Monocytosis (symptomatic): Secondary | ICD-10-CM

## 2016-09-16 DIAGNOSIS — W19XXXA Unspecified fall, initial encounter: Secondary | ICD-10-CM

## 2016-09-16 DIAGNOSIS — Z7982 Long term (current) use of aspirin: Secondary | ICD-10-CM | POA: Diagnosis not present

## 2016-09-16 DIAGNOSIS — E039 Hypothyroidism, unspecified: Secondary | ICD-10-CM | POA: Diagnosis present

## 2016-09-16 DIAGNOSIS — Z87891 Personal history of nicotine dependence: Secondary | ICD-10-CM | POA: Diagnosis not present

## 2016-09-16 DIAGNOSIS — Z9049 Acquired absence of other specified parts of digestive tract: Secondary | ICD-10-CM | POA: Diagnosis not present

## 2016-09-16 DIAGNOSIS — G309 Alzheimer's disease, unspecified: Secondary | ICD-10-CM | POA: Diagnosis present

## 2016-09-16 DIAGNOSIS — S82201A Unspecified fracture of shaft of right tibia, initial encounter for closed fracture: Secondary | ICD-10-CM | POA: Diagnosis not present

## 2016-09-16 DIAGNOSIS — S82401A Unspecified fracture of shaft of right fibula, initial encounter for closed fracture: Secondary | ICD-10-CM | POA: Diagnosis not present

## 2016-09-16 DIAGNOSIS — S82209A Unspecified fracture of shaft of unspecified tibia, initial encounter for closed fracture: Secondary | ICD-10-CM

## 2016-09-16 DIAGNOSIS — N189 Chronic kidney disease, unspecified: Secondary | ICD-10-CM | POA: Diagnosis present

## 2016-09-16 DIAGNOSIS — E038 Other specified hypothyroidism: Secondary | ICD-10-CM | POA: Diagnosis not present

## 2016-09-16 DIAGNOSIS — I129 Hypertensive chronic kidney disease with stage 1 through stage 4 chronic kidney disease, or unspecified chronic kidney disease: Secondary | ICD-10-CM | POA: Diagnosis present

## 2016-09-16 DIAGNOSIS — Z8249 Family history of ischemic heart disease and other diseases of the circulatory system: Secondary | ICD-10-CM | POA: Diagnosis not present

## 2016-09-16 DIAGNOSIS — S82202A Unspecified fracture of shaft of left tibia, initial encounter for closed fracture: Secondary | ICD-10-CM | POA: Diagnosis present

## 2016-09-16 DIAGNOSIS — D72829 Elevated white blood cell count, unspecified: Secondary | ICD-10-CM | POA: Diagnosis not present

## 2016-09-16 DIAGNOSIS — G9340 Encephalopathy, unspecified: Secondary | ICD-10-CM | POA: Diagnosis present

## 2016-09-16 DIAGNOSIS — M1990 Unspecified osteoarthritis, unspecified site: Secondary | ICD-10-CM | POA: Diagnosis present

## 2016-09-16 DIAGNOSIS — F05 Delirium due to known physiological condition: Secondary | ICD-10-CM | POA: Diagnosis not present

## 2016-09-16 DIAGNOSIS — F0280 Dementia in other diseases classified elsewhere without behavioral disturbance: Secondary | ICD-10-CM | POA: Diagnosis present

## 2016-09-16 DIAGNOSIS — Z66 Do not resuscitate: Secondary | ICD-10-CM | POA: Diagnosis present

## 2016-09-16 DIAGNOSIS — Z885 Allergy status to narcotic agent status: Secondary | ICD-10-CM | POA: Diagnosis not present

## 2016-09-16 DIAGNOSIS — Z96641 Presence of right artificial hip joint: Secondary | ICD-10-CM | POA: Diagnosis present

## 2016-09-16 DIAGNOSIS — Z90710 Acquired absence of both cervix and uterus: Secondary | ICD-10-CM | POA: Diagnosis not present

## 2016-09-16 DIAGNOSIS — I10 Essential (primary) hypertension: Secondary | ICD-10-CM | POA: Diagnosis not present

## 2016-09-16 DIAGNOSIS — M79605 Pain in left leg: Secondary | ICD-10-CM | POA: Diagnosis present

## 2016-09-16 DIAGNOSIS — L03116 Cellulitis of left lower limb: Secondary | ICD-10-CM | POA: Diagnosis present

## 2016-09-16 DIAGNOSIS — J189 Pneumonia, unspecified organism: Secondary | ICD-10-CM | POA: Diagnosis present

## 2016-09-16 DIAGNOSIS — G9341 Metabolic encephalopathy: Secondary | ICD-10-CM | POA: Diagnosis not present

## 2016-09-16 DIAGNOSIS — E876 Hypokalemia: Secondary | ICD-10-CM | POA: Diagnosis not present

## 2016-09-16 LAB — CBC WITH DIFFERENTIAL/PLATELET
BASOS ABS: 0 10*3/uL (ref 0.0–0.1)
BASOS PCT: 0 %
EOS ABS: 0.1 10*3/uL (ref 0.0–0.7)
Eosinophils Relative: 1 %
HCT: 29.9 % — ABNORMAL LOW (ref 36.0–46.0)
Hemoglobin: 10 g/dL — ABNORMAL LOW (ref 12.0–15.0)
Lymphocytes Relative: 10 %
Lymphs Abs: 1.6 10*3/uL (ref 0.7–4.0)
MCH: 29.7 pg (ref 26.0–34.0)
MCHC: 33.4 g/dL (ref 30.0–36.0)
MCV: 88.7 fL (ref 78.0–100.0)
Monocytes Absolute: 0.9 10*3/uL (ref 0.1–1.0)
Monocytes Relative: 5 %
Neutro Abs: 14.7 10*3/uL — ABNORMAL HIGH (ref 1.7–7.7)
Neutrophils Relative %: 84 %
PLATELETS: 224 10*3/uL (ref 150–400)
RBC: 3.37 MIL/uL — AB (ref 3.87–5.11)
RDW: 13.4 % (ref 11.5–15.5)
WBC: 17.3 10*3/uL — AB (ref 4.0–10.5)

## 2016-09-16 LAB — URINALYSIS, ROUTINE W REFLEX MICROSCOPIC
Bilirubin Urine: NEGATIVE
Glucose, UA: NEGATIVE mg/dL
HGB URINE DIPSTICK: NEGATIVE
Ketones, ur: NEGATIVE mg/dL
NITRITE: NEGATIVE
PH: 5 (ref 5.0–8.0)
Protein, ur: NEGATIVE mg/dL
SPECIFIC GRAVITY, URINE: 1.024 (ref 1.005–1.030)

## 2016-09-16 LAB — CREATININE, SERUM
CREATININE: 0.82 mg/dL (ref 0.44–1.00)
GFR calc Af Amer: >60 mL/min (ref >60)
GFR, EST NON AFRICAN AMERICAN: 59 mL/min — AB (ref >60)

## 2016-09-16 LAB — BASIC METABOLIC PANEL
Anion gap: 10 (ref 5–15)
BUN: 20 mg/dL (ref 6–20)
CO2: 27 mmol/L (ref 22–32)
Calcium: 9 mg/dL (ref 8.9–10.3)
Chloride: 106 mmol/L (ref 101–111)
Creatinine, Ser: 0.74 mg/dL (ref 0.44–1.00)
GFR calc Af Amer: >60 mL/min (ref >60)
GLUCOSE: 134 mg/dL — AB (ref 65–99)
Potassium: 3.9 mmol/L (ref 3.5–5.1)
SODIUM: 143 mmol/L (ref 135–145)

## 2016-09-16 LAB — PROTIME-INR
INR: 1.11
PROTHROMBIN TIME: 14.4 s (ref 11.4–15.2)

## 2016-09-16 LAB — LACTIC ACID, PLASMA: LACTIC ACID, VENOUS: 1.2 mmol/L (ref 0.5–1.9)

## 2016-09-16 LAB — CBC
HCT: 27.3 % — ABNORMAL LOW (ref 36.0–46.0)
Hemoglobin: 9 g/dL — ABNORMAL LOW (ref 12.0–15.0)
MCH: 28.7 pg (ref 26.0–34.0)
MCHC: 33 g/dL (ref 30.0–36.0)
MCV: 86.9 fL (ref 78.0–100.0)
PLATELETS: 207 10*3/uL (ref 150–400)
RBC: 3.14 MIL/uL — ABNORMAL LOW (ref 3.87–5.11)
RDW: 13.4 % (ref 11.5–15.5)
WBC: 12.9 10*3/uL — ABNORMAL HIGH (ref 4.0–10.5)

## 2016-09-16 LAB — APTT: APTT: 29 s (ref 24–36)

## 2016-09-16 LAB — POC OCCULT BLOOD, ED: Fecal Occult Bld: NEGATIVE

## 2016-09-16 MED ORDER — ALUM & MAG HYDROXIDE-SIMETH 200-200-20 MG/5ML PO SUSP: 30.0000 mL | Freq: Four times a day (QID) | ORAL | Status: DC | PRN

## 2016-09-16 MED ORDER — DIVALPROEX SODIUM 125 MG PO CSDR
125.0000 mg | DELAYED_RELEASE_CAPSULE | Freq: Three times a day (TID) | ORAL | Status: DC
  Administered 2016-09-16 – 2016-09-18 (×7): 125 mg via ORAL
  Filled 2016-09-16 (×8): qty 1

## 2016-09-16 MED ORDER — CLONAZEPAM 0.5 MG PO TABS
0.5000 mg | ORAL_TABLET | Freq: Two times a day (BID) | ORAL | Status: DC | PRN
  Administered 2016-09-16: 0.5 mg via ORAL
  Filled 2016-09-16: qty 1

## 2016-09-16 MED ORDER — LORAZEPAM 2 MG/ML IJ SOLN
0.5000 mg | Freq: Once | INTRAMUSCULAR | Status: AC
Start: 2016-09-16 — End: 2016-09-16
  Administered 2016-09-16: 0.5 mg via INTRAVENOUS
  Filled 2016-09-16: qty 1

## 2016-09-16 MED ORDER — DEXTROSE 5 % IV SOLN
500.0000 mg | INTRAVENOUS | Status: DC
  Administered 2016-09-16 – 2016-09-18 (×3): 500 mg via INTRAVENOUS
  Filled 2016-09-16 (×3): qty 500

## 2016-09-16 MED ORDER — CLONAZEPAM 0.5 MG PO TABS: 0.5000 mg | ORAL_TABLET | Freq: Two times a day (BID) | ORAL | Status: DC

## 2016-09-16 MED ORDER — HEPARIN SODIUM (PORCINE) 5000 UNIT/ML IJ SOLN
5000.0000 [IU] | Freq: Three times a day (TID) | INTRAMUSCULAR | Status: DC
  Administered 2016-09-16 – 2016-09-18 (×6): 5000 [IU] via SUBCUTANEOUS
  Filled 2016-09-16 (×6): qty 1

## 2016-09-16 MED ORDER — HYDRALAZINE HCL 20 MG/ML IJ SOLN
5.0000 mg | Freq: Four times a day (QID) | INTRAMUSCULAR | Status: DC | PRN
  Filled 2016-09-16: qty 1

## 2016-09-16 MED ORDER — ONDANSETRON HCL 4 MG/2ML IJ SOLN: 4.0000 mg | Freq: Three times a day (TID) | INTRAMUSCULAR | Status: DC | PRN

## 2016-09-16 MED ORDER — TRAZODONE HCL 50 MG PO TABS
50.0000 mg | ORAL_TABLET | Freq: Every day | ORAL | Status: DC
  Administered 2016-09-16 – 2016-09-17 (×2): 50 mg via ORAL
  Filled 2016-09-16 (×2): qty 1

## 2016-09-16 MED ORDER — LOPERAMIDE HCL 2 MG PO CAPS: 2.0000 mg | ORAL_CAPSULE | ORAL | Status: DC | PRN

## 2016-09-16 MED ORDER — GUAIFENESIN 100 MG/5ML PO SOLN: 200.0000 mg | Freq: Four times a day (QID) | ORAL | Status: DC | PRN

## 2016-09-16 MED ORDER — TRIPLE ANTIBIOTIC 3.5-400-5000 EX OINT: 1.0000 "application " | TOPICAL_OINTMENT | Freq: Three times a day (TID) | CUTANEOUS | Status: DC | PRN

## 2016-09-16 MED ORDER — VANCOMYCIN HCL IN DEXTROSE 1-5 GM/200ML-% IV SOLN
1000.0000 mg | Freq: Once | INTRAVENOUS | Status: AC
  Administered 2016-09-16: 1000 mg via INTRAVENOUS
  Filled 2016-09-16: qty 200

## 2016-09-16 MED ORDER — DEXTROSE 5 % IV SOLN
1.0000 g | INTRAVENOUS | Status: DC
  Administered 2016-09-16 – 2016-09-18 (×3): 1 g via INTRAVENOUS
  Filled 2016-09-16 (×3): qty 10

## 2016-09-16 MED ORDER — LORAZEPAM BOLUS VIA INFUSION: 0.5000 mg | Freq: Once | INTRAVENOUS | Status: DC

## 2016-09-16 MED ORDER — PIPERACILLIN-TAZOBACTAM 3.375 G IVPB
3.3750 g | Freq: Once | INTRAVENOUS | Status: AC
  Administered 2016-09-16: 3.375 g via INTRAVENOUS
  Filled 2016-09-16: qty 50

## 2016-09-16 MED ORDER — MAGNESIUM HYDROXIDE 400 MG/5ML PO SUSP: 30.0000 mL | Freq: Every evening | ORAL | Status: DC | PRN

## 2016-09-16 MED ORDER — RIVASTIGMINE TARTRATE 1.5 MG PO CAPS
1.5000 mg | ORAL_CAPSULE | Freq: Two times a day (BID) | ORAL | Status: DC
  Administered 2016-09-16 – 2016-09-18 (×5): 1.5 mg via ORAL
  Filled 2016-09-16 (×5): qty 1

## 2016-09-16 MED ORDER — ESCITALOPRAM OXALATE 10 MG PO TABS
5.0000 mg | ORAL_TABLET | Freq: Every day | ORAL | Status: DC
  Administered 2016-09-16 – 2016-09-18 (×3): 5 mg via ORAL
  Filled 2016-09-16 (×3): qty 1

## 2016-09-16 MED ORDER — LEVOTHYROXINE SODIUM 50 MCG PO TABS
50.0000 ug | ORAL_TABLET | Freq: Every day | ORAL | Status: DC
  Administered 2016-09-16 – 2016-09-18 (×3): 50 ug via ORAL
  Filled 2016-09-16 (×3): qty 1

## 2016-09-16 MED ORDER — TRAMADOL HCL 50 MG PO TABS
50.0000 mg | ORAL_TABLET | Freq: Four times a day (QID) | ORAL | Status: DC | PRN
  Administered 2016-09-16 – 2016-09-18 (×4): 50 mg via ORAL
  Filled 2016-09-16 (×4): qty 1

## 2016-09-16 MED ORDER — SENNA 8.6 MG PO TABS
2.0000 | ORAL_TABLET | Freq: Two times a day (BID) | ORAL | Status: DC
  Administered 2016-09-16 – 2016-09-18 (×5): 17.2 mg via ORAL
  Filled 2016-09-16 (×5): qty 2

## 2016-09-16 MED ORDER — ACETAMINOPHEN 325 MG PO TABS
650.0000 mg | ORAL_TABLET | Freq: Four times a day (QID) | ORAL | Status: DC | PRN
  Administered 2016-09-17: 650 mg via ORAL
  Filled 2016-09-16: qty 2

## 2016-09-16 MED ORDER — ADULT MULTIVITAMIN W/MINERALS CH
1.0000 | ORAL_TABLET | Freq: Every day | ORAL | Status: DC
  Administered 2016-09-17 – 2016-09-18 (×2): 1 via ORAL
  Filled 2016-09-16 (×5): qty 1

## 2016-09-16 MED ORDER — SODIUM CHLORIDE 0.9 % IV SOLN
INTRAVENOUS | Status: DC
  Administered 2016-09-16 – 2016-09-17 (×3): via INTRAVENOUS

## 2016-09-16 NOTE — Consult Note (Signed)
   [facility] CM Inpatient Consult   09/16/2016  [name] [birth year] [id number]   Received referral from [facility] RN Health Coach due to patient being on ED high utilizer list. Micah Flesher to bedside to speak with patient. She is confused. No family at bedside. Will attempt to contact son regarding [facility] Care Management services. Also will need to confirm Primary Care MD as it does not appear her listed Primary Care MD is at [facility] Provider. Also noted patient is from a facility.   Raiford Noble, MSN-Ed, RN,BSN [facility] Liaison [phone]

## 2016-09-16 NOTE — ED Notes (Signed)
Bed: RU04 Expected date:  Expected time:  Means of arrival:  Comments: RM 21

## 2016-09-16 NOTE — Progress Notes (Signed)
CSW consulted to assist with d/c planning. Pt is from [facility] and unable to participate in d/c planning due to cognitive deficients. No family at bedside. PN reviewed. Unclear at this time if a higher level of care is needed. PT consult ( when medically appropriate ) will help determine level of care needed at d/c. CSW will follow to assist with d/c planning.  Cori Razor LCSW [phone]

## 2016-09-16 NOTE — H&P (Addendum)
History and Physical    [name]:[id number] DOB: 07-08-1920 DOA: 09/15/2016  PCP: PROVIDER NOT IN SYSTEM Patient coming from: SNF Chief Complaint: left leg pain  HPI: [name] is a 83 y.o. female with medical history significant of CKD, arthritis, anemia, HTN, hypothyroidism, right hip fracture, dementia, depression, who presented to the ED after she sustained a witnessed fall in the facility. Initially patient was pain free, but later started complaining of left leg pain.   ED Course: On arrival her VS were stable, blood work demonstrated elevated WBC count 17,300, Hgb 10.0, otherwise unremarkable Left leg Xray showed possible cortical avulsion fracture of the distal anterior tibia, possible tiny ship fracture of the lateral fibular malleolus During my assessment patient denied chest pain, left leg pain, SOB, nausea, abdominal pain.  Review of Systems: As per HPI otherwise 10 point reodfview of systems negative.   Ambulatory Status: Unkwon, patient is confused and unable to answer question appropriately  Past Medical History:  Diagnosis Date  . Acute bronchitis 04/10/2013  . Anemia   . Angina   . Anxiety   . Arthritis   . Arthritis 10/02/2012  . Asthma   . Chicken pox   . Chronic kidney disease   . Colon polyps   . Dehydration 08/31/2013  . Dementia   . Depression   . Dysrhythmia   . Headache(784.0)   . Heart murmur   . Hypertension   . Hypothyroidism   . Left leg pain 10/02/2012  . Pneumonia   . Recurrent upper respiratory infection (URI)   . Shortness of breath   . UTI (urinary tract infection) 12/30/2012  . Valvular heart disease 09/04/2012    Past Surgical History:  Procedure Laterality Date  . ABDOMINAL HYSTERECTOMY    . APPENDECTOMY    . BACK SURGERY    . CHOLECYSTECTOMY    . COLONOSCOPY W/ POLYPECTOMY    . EYE SURGERY     cataract removed and eye lids lifted  . feet surgery    . FRACTURE SURGERY     bilateral arms  . HAND SURGERY    . HIP  ARTHROPLASTY Right 09/20/2015   Procedure: ARTHROPLASTY RIGHT  BIPOLAR ANTERIOR HIP (HEMIARTHROPLASTY);  Surgeon: Samson Frederic, MD;  Location: [facility];  Service: Orthopedics;  Laterality: Right;  . kidney stones    . SHOULDER ARTHROSCOPY    . TONSILLECTOMY    . TONSILLECTOMY    . TUBAL LIGATION      Social History   Social History  . Marital status: Widowed    Spouse name: N/A  . Number of children: N/A  . Years of education: N/A   Occupational History  . Not on file.   Social History Main Topics  . Smoking status: Former Smoker    Packs/day: 0.20    Years: 1.00    Types: Cigarettes    Quit date: 06/02/1941  . Smokeless tobacco: Never Used  . Alcohol use No  . Drug use: No  . Sexual activity: Not Currently    Birth control/ protection: Abstinence   Other Topics Concern  . Not on file   Social History Narrative   Lives at [facility] with son and dil   Doesn't use cane or walker, but has cane at home. Remote smoking history.     Allergies  Allergen Reactions  . Morphine And Related Other (See Comments)    Reaction:  Unknown   . Codeine Rash  . Hydrocodone Rash  Family History  Problem Relation Age of Onset  . Heart disease Mother   . Heart disease Father   . Heart disease Other   . Birth defects Other     Prior to Admission medications   Medication Sig Start Date End Date Taking? Authorizing Provider  acetaminophen (TYLENOL) 500 MG tablet Take 500 mg by mouth every 4 (four) hours as needed for mild pain, moderate pain, fever or headache.    Yes Historical Provider, MD  alum & mag hydroxide-simeth (MINTOX) 200-200-20 MG/5ML suspension Take 30 mLs by mouth every 6 (six) hours as needed for indigestion or heartburn.    Yes Historical Provider, MD  aspirin 81 MG chewable tablet Chew 81 mg by mouth daily.   Yes Historical Provider, MD  clonazePAM (KLONOPIN) 0.5 MG tablet Take 1 tablet (0.5 mg total) by mouth 2 (two) times daily. Patient taking  differently: Take 0.25-0.5 mg by mouth See admin instructions. Pt takes one-half tablet every morning and one tablet at 1400 and 2000. 09/23/15  Yes Elease Etienne, MD  divalproex (DEPAKOTE SPRINKLE) 125 MG capsule Take 125 mg by mouth 3 (three) times daily.    Yes Historical Provider, MD  escitalopram (LEXAPRO) 5 MG tablet Take 5 mg by mouth daily.   Yes Historical Provider, MD  guaifenesin (ROBITUSSIN) 100 MG/5ML syrup Take 200 mg by mouth every 6 (six) hours as needed for cough.    Yes Historical Provider, MD  levothyroxine (SYNTHROID, LEVOTHROID) 50 MCG tablet Take 50 mcg by mouth daily before breakfast.    Yes Historical Provider, MD  loperamide (IMODIUM) 2 MG capsule Take 2 mg by mouth every 3 (three) hours as needed for diarrhea or loose stools.    Yes Historical Provider, MD  magnesium hydroxide (MILK OF MAGNESIA) 400 MG/5ML suspension Take 30 mLs by mouth at bedtime as needed for mild constipation.   Yes Historical Provider, MD  Multiple Vitamins-Minerals (MULTIVITAMIN WITH MINERALS) tablet Take 1 tablet by mouth daily.   Yes Historical Provider, MD  Neomycin-Bacitracin-Polymyxin (TRIPLE ANTIBIOTIC) [phone] OINT Apply 1 application topically 3 (three) times daily as needed (for skin tears/abrasions).    Yes Historical Provider, MD  Nutritional Supplements (NUTRITIONAL DRINK) LIQD Take 1 Bottle by mouth 3 (three) times daily with meals. Mighty Shakes   Yes Historical Provider, MD  rivastigmine (EXELON) 1.5 MG capsule Take 1.5 mg by mouth 2 (two) times daily.   Yes Historical Provider, MD  senna (SENOKOT) 8.6 MG TABS tablet Take 2 tablets by mouth 2 (two) times daily.   Yes Historical Provider, MD  traMADol (ULTRAM) 50 MG tablet Take 1 tablet (50 mg total) by mouth every 6 (six) hours as needed for severe pain. Patient taking differently: Take 50 mg by mouth 2 (two) times daily. Pt is also able to take one tablet every six hours as needed for moderate to severe pain. 09/23/15  Yes Elease Etienne, MD  traZODone (DESYREL) 50 MG tablet Take 50 mg by mouth at bedtime.    Yes Historical Provider, MD    Physical Exam: Vitals:   09/16/16 0600 09/16/16 0630 09/16/16 0700 09/16/16 0740  BP: (!) 121/55 (!) 127/52 (!) 125/54 (!) 136/58  Pulse: 67 66 66 75  Resp:    18  SpO2: 96% 97% 97% 99%  Weight:      Height:         General: Appears calm and comfortable Eyes: PERRLA, EOMI, normal lids, iris ENT:  grossly normal hearing, lips & tongue, mucous  membranes moist and intact Neck: no lymphoadenopathy, masses or thyromegaly Cardiovascular: RRR, no m/r/g. No JVD, carotid bruits. No LE edema.  Respiratory: bilateral no wheezes, rales, rhonchi or cracles. Normal respiratory effort. No accessory muscle use observed Abdomen: soft, non-tender, non-distended, no organomegaly or masses appreciated. BS present in all quadrants Skin: no rash, ulcers or induration seen on limited exam Musculoskeletal: grossly normal tone BUE/BLE, good ROM, no bony abnormality or joint deformities observed Psychiatric: confused, disoriented, speech nonsensical Neurologic: CN II-XII grossly intact, moves all extremities in coordinated fashion, sensation intact  Labs on Admission: I have personally reviewed following labs and imaging studies  CBC, BMP  GFR: Estimated Creatinine Clearance: 46.3 mL/min (by C-G formula based on SCr of 0.74 mg/dL).   Creatinine Clearance: Estimated Creatinine Clearance: 46.3 mL/min (by C-G formula based on SCr of 0.74 mg/dL).    Radiological Exams on Admission: Dg Knee 2 Views Left  Result Date: 09/16/2016 CLINICAL DATA:  Unwitnessed fall pain below the patella EXAM: LEFT KNEE - 1-2 VIEW COMPARISON:  None. FINDINGS: Vascular calcifications. Joint space calcifications medially and laterally. No fracture or malalignment. Trace suprapatellar effusion. Mild narrowing of the medial compartment and patellofemoral compartments with bony spurring. IMPRESSION: 1. No acute osseous  abnormality 2. Chondrocalcinosis 3. Trace suprapatellar effusion Electronically Signed   By: Jasmine Pang M.D.   On: 09/16/2016 01:31   Dg Tibia/fibula Left  Result Date: 09/16/2016 CLINICAL DATA:  Fall with pain EXAM: LEFT TIBIA AND FIBULA - 2 VIEW COMPARISON:  None. FINDINGS: Soft tissue calcifications. Mild diffuse subcutaneous edema. Possible anterior cortical fracture of the distal tibia. Small plantar calcaneal spur. Possible tiny avulsion fibular malleolar tip IMPRESSION: 1. Possible cortical avulsion fracture distal anterior tibia. 2. Possible tiny chip fracture off the lateral fibular malleolus. Electronically Signed   By: Jasmine Pang M.D.   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   Tibial fracture   Left fibular fracture    Fall with left tibial and fibular fractures Continue to immobilize with splint and provide pain control  Leukocytosis of unknown origin Will check UA, chest Xray Patient received a dose of Vancomycin and Zosyn IV in the ED Will hold further antibiotic therapy for now until results of the UA and chest XRay are available Monitor WBC's count  Dementia  Monitor for safety, provide supportive care and assistance with ADL Continue Depakote,   Hypertension - currently stable Not on medications at home, continue to monitor Will add Hydralazine IV with parameters to give prn  Hypothyroidism - continue Synthroid    DVT prophylaxis: heparin Code Status: DNR Family Communication: none Disposition Plan: MedSurg Consults called: none Admission status: inpatient   Raymon Mutton, [place] Pager: [phone] [facility]  If 7PM-7AM, please contact night-coverage [email or web address] Password [name]  09/16/2016, 7:46 AM    Patient seen and examined. She is sleepy, but  wake up an answer questions. She denies pain. She report some productive cough. She is only oriented to person. She only remember that she fell down.  1- fibula, tibia fracture; She has splint on. Pain management. Ortho consulted, Dr Ferd Hibbs. There was not redness on physical exam of LE perform by PA/  2-Leukocytosis; will cover for PNA. ceftriaxone and Azithromycin. Check strep Pneumonia antigen, sputum culture.  3-Dementia; continue with home medications. Will change klonopin  to PRN.

## 2016-09-16 NOTE — Progress Notes (Signed)
This is a no charge note  Pending admission per Dr. Nicanor Alcon  83 year old lady with past medical history of dementia, anemia, hypertension, asthma, hypothyroidism, depression, anxiety, who presents with fall and left leg pain. Denies LOC. X-ray of left tibia/fibular showed  possible cortical avulsion fracture distal anterior tibia and possible tiny chip fracture off the lateral fibular malleolus. Will apply splint. Pt also has possible left leg cellulitis. IV Vanco and Zosyn was started EDP. WBC 17.3, electrolytes renal function okay. Will check lactic acid level. Pt is accepted to me-surg bed as inpt.   Lorretta Harp, MD  [facility] Pager [phone]  If 7PM-7AM, please contact night-coverage [email or web address] Password [facility] 09/16/2016, 3:34 AM

## 2016-09-16 NOTE — Progress Notes (Addendum)
Spoke with Mallie Darting Case Manager at [facility], who called in regards to patients and her diagnosis. Explained to Debbie at this time, I was unable to answer many questions due to patients arrival time to floor was only prior. I also called son [name], made him aware of patients location and condition. I attempted to ask questions about patients baseline, but he was only able to answer a few questions.

## 2016-09-16 NOTE — Progress Notes (Signed)
Patient began hallucinating, stating she had 83 year old girls and needed to get home, they were home alone. Patient had several hallucinations in regards to where she was, who was in the room, who was alive. Patient yelling, screaming to where she could be heard from the nurses station with two RN's in the room assisting her. Patient climbing out of bed continuously, pulling at IV, climbing over side rails of bed. Patient crying hysterically and was unable to console her. Klonopin PO not effective. MD aware. Ativan IV ordered for one time dose, patient was still combative, hitting at staff. Patient still trying to climb out of bed. Activity belt given to patient as well.

## 2016-09-16 NOTE — ED Notes (Signed)
Ortho tech has been called.  He will be coming down to place splint and prefers to be in the ED d/t the ortho supply room being down here.

## 2016-09-16 NOTE — Progress Notes (Signed)
Patient is still hallucinating and trying to get out of bed but is no longer combative.At bedside.

## 2016-09-16 NOTE — ED Notes (Signed)
Admitting MD at bedside.

## 2016-09-16 NOTE — Consult Note (Signed)
Reason for Consult:  Left leg pain Referring Physician: ED Physician  [name] is an 83 y.o. female.  HPI:  Pt is a resident at a nursing home.  She was sent to the ED after an unwitnessed fall.  Nursing notes indicate she injured her left leg.  She initially denies any pain but later stated she did have left leg pain. Patient is a poor historian as she has dementia. She is confused and knows who she is, but doesn't seem to be oriented otherwise.  Past Medical History:  Diagnosis Date  . Acute bronchitis 04/10/2013  . Anemia   . Angina   . Anxiety   . Arthritis   . Arthritis 10/02/2012  . Asthma   . Chicken pox   . Chronic kidney disease   . Colon polyps   . Dehydration 08/31/2013  . Dementia   . Depression   . Dysrhythmia   . Headache(784.0)   . Heart murmur   . Hypertension   . Hypothyroidism   . Left leg pain 10/02/2012  . Pneumonia   . Recurrent upper respiratory infection (URI)   . Shortness of breath   . UTI (urinary tract infection) 12/30/2012  . Valvular heart disease 09/04/2012    Past Surgical History:  Procedure Laterality Date  . ABDOMINAL HYSTERECTOMY    . APPENDECTOMY    . BACK SURGERY    . CHOLECYSTECTOMY    . COLONOSCOPY W/ POLYPECTOMY    . EYE SURGERY     cataract removed and eye lids lifted  . feet surgery    . FRACTURE SURGERY     bilateral arms  . HAND SURGERY    . HIP ARTHROPLASTY Right 09/20/2015   Procedure: ARTHROPLASTY RIGHT  BIPOLAR ANTERIOR HIP (HEMIARTHROPLASTY);  Surgeon: Rod Can, MD;  Location: [facility];  Service: Orthopedics;  Laterality: Right;  . kidney stones    . SHOULDER ARTHROSCOPY    . TONSILLECTOMY    . TONSILLECTOMY    . TUBAL LIGATION      Family History  Problem Relation Age of Onset  . Heart disease Mother   . Heart disease Father   . Heart disease Other   . Birth defects Other     Social History:  reports that she quit smoking about 75 years ago. Her smoking use included Cigarettes. She has a 0.20 pack-year  smoking history. She has never used smokeless tobacco. She reports that she does not drink alcohol or use drugs.  Allergies:  Allergies  Allergen Reactions  . Morphine And Related Other (See Comments)    Reaction:  Unknown   . Codeine Rash  . Hydrocodone Rash      Results for orders placed or performed during the hospital encounter of 09/15/16 (from the past 48 hour(s))  CBC with Differential     Status: Abnormal   Collection Time: 09/16/16 12:18 AM  Result Value Ref Range   WBC 17.3 (H) 4.0 - 10.5 K/uL   RBC 3.37 (L) 3.87 - 5.11 MIL/uL   Hemoglobin 10.0 (L) 12.0 - 15.0 g/dL   HCT 29.9 (L) 36.0 - 46.0 %   MCV 88.7 78.0 - 100.0 fL   MCH 29.7 26.0 - 34.0 pg   MCHC 33.4 30.0 - 36.0 g/dL   RDW 13.4 11.5 - 15.5 %   Platelets 224 150 - 400 K/uL   Neutrophils Relative % 84 %   Neutro Abs 14.7 (H) 1.7 - 7.7 K/uL   Lymphocytes Relative 10 %  Lymphs Abs 1.6 0.7 - 4.0 K/uL   Monocytes Relative 5 %   Monocytes Absolute 0.9 0.1 - 1.0 K/uL   Eosinophils Relative 1 %   Eosinophils Absolute 0.1 0.0 - 0.7 K/uL   Basophils Relative 0 %   Basophils Absolute 0.0 0.0 - 0.1 K/uL  Basic metabolic panel     Status: Abnormal   Collection Time: 09/16/16 12:18 AM  Result Value Ref Range   Sodium 143 135 - 145 mmol/L   Potassium 3.9 3.5 - 5.1 mmol/L   Chloride 106 101 - 111 mmol/L   CO2 27 22 - 32 mmol/L   Glucose, Bld 134 (H) 65 - 99 mg/dL   BUN 20 6 - 20 mg/dL   Creatinine, Ser 0.74 0.44 - 1.00 mg/dL   Calcium 9.0 8.9 - 10.3 mg/dL   GFR calc non Af Amer >60 >60 mL/min   GFR calc Af Amer >60 >60 mL/min    Comment: (NOTE) The eGFR has been calculated using the CKD EPI equation. This calculation has not been validated in all clinical situations. eGFR's persistently <60 mL/min signify possible Chronic Kidney Disease.    Anion gap 10 5 - 15  Lactic acid, plasma     Status: None   Collection Time: 09/16/16  3:20 AM  Result Value Ref Range   Lactic Acid, Venous 1.2 0.5 - 1.9 mmol/L   Protime-INR     Status: None   Collection Time: 09/16/16  3:34 AM  Result Value Ref Range   Prothrombin Time 14.4 11.4 - 15.2 seconds   INR 1.11   APTT     Status: None   Collection Time: 09/16/16  3:34 AM  Result Value Ref Range   aPTT 29 24 - 36 seconds  POC occult blood, ED     Status: None   Collection Time: 09/16/16  4:40 AM  Result Value Ref Range   Fecal Occult Bld NEGATIVE NEGATIVE  Urinalysis, Routine w reflex microscopic     Status: Abnormal   Collection Time: 09/16/16  8:01 AM  Result Value Ref Range   Color, Urine YELLOW YELLOW   APPearance CLEAR CLEAR   Specific Gravity, Urine 1.024 1.005 - 1.030   pH 5.0 5.0 - 8.0   Glucose, UA NEGATIVE NEGATIVE mg/dL   Hgb urine dipstick NEGATIVE NEGATIVE   Bilirubin Urine NEGATIVE NEGATIVE   Ketones, ur NEGATIVE NEGATIVE mg/dL   Protein, ur NEGATIVE NEGATIVE mg/dL   Nitrite NEGATIVE NEGATIVE   Leukocytes, UA TRACE (A) NEGATIVE   RBC / HPF 6-30 0 - 5 RBC/hpf   WBC, UA 6-30 0 - 5 WBC/hpf   Bacteria, UA RARE (A) NONE SEEN   Squamous Epithelial / LPF 0-5 (A) NONE SEEN   Mucous PRESENT   CBC     Status: Abnormal   Collection Time: 09/16/16  9:06 AM  Result Value Ref Range   WBC 12.9 (H) 4.0 - 10.5 K/uL   RBC 3.14 (L) 3.87 - 5.11 MIL/uL   Hemoglobin 9.0 (L) 12.0 - 15.0 g/dL   HCT 27.3 (L) 36.0 - 46.0 %   MCV 86.9 78.0 - 100.0 fL   MCH 28.7 26.0 - 34.0 pg   MCHC 33.0 30.0 - 36.0 g/dL   RDW 13.4 11.5 - 15.5 %   Platelets 207 150 - 400 K/uL  Creatinine, serum     Status: Abnormal   Collection Time: 09/16/16  9:06 AM  Result Value Ref Range   Creatinine, Ser 0.82 0.44 - 1.00 mg/dL  GFR calc non Af Amer 59 (L) >60 mL/min   GFR calc Af Amer >60 >60 mL/min    Comment: (NOTE) The eGFR has been calculated using the CKD EPI equation. This calculation has not been validated in all clinical situations. eGFR's persistently <60 mL/min signify possible Chronic Kidney Disease.     Dg Chest 1 View  Result Date:  09/16/2016 CLINICAL DATA:  Leukocytosis.  Hypoxia.  Fall. EXAM: CHEST 1 VIEW COMPARISON:  11/24/ 2017.  01/09/2016. FINDINGS: Mediastinum and hilar structures are normal. Stable mild cardiomegaly. No pulmonary venous congestion. Mild right infiltrate noted. No pleural effusion or pneumothorax. Thoracic spine scoliosis and degenerative change. Stable postsurgical changes proximal right humerus. IMPRESSION: Mild right base infiltrate suggesting pneumonia . Follow-up exam to demonstrate clearing suggested. Electronically Signed   By: Marcello Moores  Register   On: 09/16/2016 08:55   Dg Knee 2 Views Left  Result Date: 09/16/2016 CLINICAL DATA:  Unwitnessed fall pain below the patella EXAM: LEFT KNEE - 1-2 VIEW COMPARISON:  None. FINDINGS: Vascular calcifications. Joint space calcifications medially and laterally. No fracture or malalignment. Trace suprapatellar effusion. Mild narrowing of the medial compartment and patellofemoral compartments with bony spurring. IMPRESSION: 1. No acute osseous abnormality 2. Chondrocalcinosis 3. Trace suprapatellar effusion Electronically Signed   By: Donavan Foil M.D.   On: 09/16/2016 01:31   Dg Tibia/fibula Left  Result Date: 09/16/2016 CLINICAL DATA:  Fall with pain EXAM: LEFT TIBIA AND FIBULA - 2 VIEW COMPARISON:  None. FINDINGS: Soft tissue calcifications. Mild diffuse subcutaneous edema. Possible anterior cortical fracture of the distal tibia. Small plantar calcaneal spur. Possible tiny avulsion fibular malleolar tip IMPRESSION: 1. Possible cortical avulsion fracture distal anterior tibia. 2. Possible tiny chip fracture off the lateral fibular malleolus. Electronically Signed   By: Donavan Foil M.D.   On: 09/16/2016 01:33    Review of Systems  Unable to perform ROS: Dementia  Musculoskeletal: Negative for joint pain.  Psychiatric/Behavioral: Positive for memory loss.    Blood pressure 140/62, pulse 84, temperature 99.3 F (37.4 C), temperature source Oral, resp. rate  16, height 5' 6"  (1.676 m), weight 81.6 kg (180 lb), SpO2 97 %. Physical Exam  Constitutional: She appears well-developed.  HENT:  Head: Normocephalic.  Eyes: Pupils are equal, round, and reactive to light.  Neck: Neck supple. No JVD present. No tracheal deviation present. No thyromegaly present.  Cardiovascular: Normal rate, regular rhythm and intact distal pulses.   Respiratory: Effort normal and breath sounds normal. No respiratory distress. She has no wheezes.  GI: Soft. There is no tenderness. There is no guarding.  Musculoskeletal:       Right ankle: No tenderness. No lateral malleolus and no medial malleolus tenderness found.  Lymphadenopathy:    She has no cervical adenopathy.  Neurological: She is alert.  Skin: Skin is warm and dry.  Psychiatric: She has a normal mood and affect.    Assessment/Plan:   Findings on x-ray may indicate an old fracture No tenderness to palpation Patient can be WBAT on the left leg If complaining of pain with ambulation may use a Cam Walker Follow up with Ortho as needed     Pricilla Loveless 09/16/2016, 2:30 PM

## 2016-09-16 NOTE — ED Provider Notes (Signed)
[facility]-EMERGENCY DEPT Provider Note   CSN: [id number] Arrival date & time: 09/15/16  2332     History   Chief Complaint Chief Complaint  Patient presents with  . Fall    HPI RONNETTE RUMP is a 83 y.o. female.  HPI Pt resides in a nursing home.  She was sent to the ED after an unwitnessed fall.  Nursing notes indicate she injured her left leg.  She initially denies any pain but later stated she did have left leg pain.  She denies any LOC.  No complaints of chest pain.  No fevers.  No vomiting.   Patient Active Problem List   Diagnosis Date Noted  . Pressure ulcer 09/20/2015  . Displaced fracture of right femoral neck (HCC) 09/19/2015  . Altered mental status   . Confusion 06/26/2015  . Fall   . Aspiration pneumonia (HCC) 01/10/2015  . Acute encephalopathy 01/08/2015  . UTI (urinary tract infection) 01/08/2015  . 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 Recurrent falls 11/27/2014  . FTT (failure to thrive) in adult 10/26/2014  . Dehydration 10/26/2014  . Abnormal urinalysis 10/26/2014  . Normocytic anemia 10/26/2014  . Lump of skin of left upper extremity 09/07/2014  . Rash and nonspecific skin eruption 05/08/2014  . Syncope and collapse 11/30/2013  . Low back pain, episodic 11/20/2013  . Chronic kidney disease   . Insomnia 08/31/2013  . Arthritis 10/02/2012  . Valvular heart disease 09/04/2012  . Restless leg syndrome 06/06/2012  . Trochanteric bursitis 08/02/2011  . Dementia 08/02/2011  . Leg pain 03/28/2011  . Hypertension 09/22/2010  . Hypothyroidism 09/22/2010    Past Surgical History:  Procedure Laterality Date  . ABDOMINAL HYSTERECTOMY    . APPENDECTOMY    . BACK SURGERY    . CHOLECYSTECTOMY    . COLONOSCOPY W/ POLYPECTOMY    . EYE SURGERY     cataract removed and eye lids lifted  . feet surgery    . FRACTURE SURGERY     bilateral arms  . HAND SURGERY    . HIP ARTHROPLASTY Right 09/20/2015   Procedure: ARTHROPLASTY RIGHT   BIPOLAR ANTERIOR HIP (HEMIARTHROPLASTY);  Surgeon: Samson Frederic, MD;  Location: [facility];  Service: Orthopedics;  Laterality: Right;  . kidney stones    . SHOULDER ARTHROSCOPY    . TONSILLECTOMY    . TONSILLECTOMY    . TUBAL LIGATION      OB History    No data available       Home Medications    Prior to Admission medications   Medication Sig Start Date End Date Taking? Authorizing Provider  acetaminophen (TYLENOL) 500 MG tablet Take 500 mg by mouth See admin instructions. Take 1 caplet by mouth every 4 hours as needed for 24 hours for headache, minor discomfort, fever - do not exceed 2000 mg in 2 hours    Historical Provider, MD  alum & mag hydroxide-simeth (MINTOX) 200-200-20 MG/5ML suspension Take 30 mLs by mouth every 6 (six) hours as needed for indigestion or heartburn.     Historical Provider, MD  aspirin 81 MG chewable tablet Chew 81 mg by mouth daily.    Historical Provider, MD  clonazePAM (KLONOPIN) 0.5 MG tablet Take 1 tablet (0.5 mg total) by mouth 2 (two) times daily. Patient taking differently: Take 0.25-0.5 mg by mouth See admin instructions. Take 1/2 tablet (0.25 mg) by mouth every morning and 1 tablet at 2 PM and 1 tablet at 8 PM 09/23/15  Elease Etienne, MD  divalproex (DEPAKOTE SPRINKLE) 125 MG capsule Take 125 mg by mouth 3 (three) times daily. 8am, 2pm, 8pm    Historical Provider, MD  escitalopram (LEXAPRO) 5 MG tablet Take 5 mg by mouth daily.    Historical Provider, MD  guaifenesin (ROBITUSSIN) 100 MG/5ML syrup Take 200 mg by mouth every 6 (six) hours as needed for cough.     Historical Provider, MD  levothyroxine (SYNTHROID, LEVOTHROID) 50 MCG tablet Take 50 mcg by mouth daily at 6 (six) AM.     Historical Provider, MD  loperamide (IMODIUM) 2 MG capsule Take 2 mg by mouth as needed for diarrhea or loose stools.     Historical Provider, MD  magnesium hydroxide (MILK OF MAGNESIA) 400 MG/5ML suspension Take 30 mLs by mouth at bedtime as needed for mild constipation.     Historical Provider, MD  Multiple Vitamins-Minerals (MULTIVITAMIN WITH MINERALS) tablet Take 1 tablet by mouth daily.    Historical Provider, MD  Neomycin-Bacitracin-Polymyxin (TRIPLE ANTIBIOTIC) [phone] OINT Apply topically as needed (for Skin tear and abrasions).    Historical Provider, MD  Nutritional Supplements (NUTRITIONAL DRINK) LIQD Take 1 Bottle by mouth 3 (three) times daily with meals. Mighty Shakes    Historical Provider, MD  rivastigmine (EXELON) 1.5 MG capsule Take 1.5 mg by mouth 2 (two) times daily.    Historical Provider, MD  senna (SENOKOT) 8.6 MG TABS tablet Take 2 tablets by mouth 2 (two) times daily.    Historical Provider, MD  traMADol (ULTRAM) 50 MG tablet Take 1 tablet (50 mg total) by mouth every 6 (six) hours as needed for severe pain. Patient taking differently: Take 50 mg by mouth See admin instructions. Take 1 tablet (50 mg) by mouth twice daily, may also take 1 tablet every 6 hours as needed for moderate or severe pain 09/23/15   Elease Etienne, MD  traZODone (DESYREL) 50 MG tablet Take 50 mg by mouth at bedtime.     Historical Provider, MD    Family History Family History  Problem Relation Age of Onset  . Heart disease Mother   . Heart disease Father   . Heart disease Other   . Birth defects Other     Social History Social History  Substance Use Topics  . Smoking status: Former Smoker    Packs/day: 0.20    Years: 1.00    Types: Cigarettes    Quit date: 06/02/1941  . Smokeless tobacco: Never Used  . Alcohol use No     Allergies   Morphine and related; Codeine; and Hydrocodone   Review of Systems Review of Systems  All other systems reviewed and are negative.    Physical Exam Updated Vital Signs Ht  (1.676 m)   Wt 81.6 kg   BMI 29.05 kg/m   Physical Exam  Constitutional: She appears well-developed and well-nourished. No distress.  HENT:  Head: Normocephalic and atraumatic.  Right Ear: External ear normal.  Left Ear:  External ear normal.  Eyes: Conjunctivae are normal. Right eye exhibits no discharge. Left eye exhibits no discharge. No scleral icterus.  Neck: Neck supple. No tracheal deviation present.  Cardiovascular: Normal rate, regular rhythm and intact distal pulses.   Pulmonary/Chest: Effort normal and breath sounds normal. No stridor. No respiratory distress. She has no wheezes. She has no rales.  Abdominal: Soft. Bowel sounds are normal. She exhibits no distension. There is no tenderness. There is no rebound and no guarding.  Musculoskeletal: She exhibits no edema.  Right hip: Normal.       Left hip: Normal.       Left knee: Tenderness found.       Cervical back: Normal.       Left lower leg: She exhibits tenderness. She exhibits no swelling and no edema.  Mild erythema of medial aspect of left lower leg, mild ttp  Neurological: She is alert. She has normal strength. No cranial nerve deficit (no facial droop, extraocular movements intact, no slurred speech) or sensory deficit. She exhibits normal muscle tone. She displays no seizure activity. Coordination normal.  Skin: Skin is warm and dry. No rash noted.  Psychiatric: She has a normal mood and affect.  Nursing note and vitals reviewed.    ED Treatments / Results  Labs (all labs ordered are listed, but only abnormal results are displayed) Labs Reviewed  CBC WITH DIFFERENTIAL/PLATELET - Abnormal; Notable for the following:       Result Value   WBC 17.3 (*)    RBC 3.37 (*)    Hemoglobin 10.0 (*)    HCT 29.9 (*)    Neutro Abs 14.7 (*)    All other components within normal limits  BASIC METABOLIC PANEL - Abnormal; Notable for the following:    Glucose, Bld 134 (*)    All other components within normal limits  POC OCCULT BLOOD, ED     Radiology pending Procedures Procedures (including critical care time)  Medications Ordered in ED Medications  piperacillin-tazobactam (ZOSYN) IVPB 3.375 g (not administered)     Initial  Impression / Assessment and Plan / ED Course  I have reviewed the triage vital signs and the nursing notes.  Pertinent labs & imaging results that were available during my care of the patient were reviewed by me and considered in my medical decision making (see chart for details).   Pt presents to the ED from the nursing home for an unwitnessed fall.  Pt has erythema of her lower leg that looks like a cellulitis.  WBC is elevated.  No fever in the ED.  Hgb has also decreased but no complaints of blood in the stool.  Will send off fecal occult blood.  Xrays pending.  Discussed with Dr Nicanor Alcon about Consulting with medical service for overnight observation.  Final Clinical Impressions(s) / ED Diagnoses   Final diagnoses:  Cellulitis of left lower extremity      Linwood Dibbles, MD 09/16/16 [phone]

## 2016-09-17 DIAGNOSIS — S82401A Unspecified fracture of shaft of right fibula, initial encounter for closed fracture: Secondary | ICD-10-CM

## 2016-09-17 DIAGNOSIS — E038 Other specified hypothyroidism: Secondary | ICD-10-CM

## 2016-09-17 DIAGNOSIS — F05 Delirium due to known physiological condition: Secondary | ICD-10-CM

## 2016-09-17 DIAGNOSIS — D72829 Elevated white blood cell count, unspecified: Secondary | ICD-10-CM

## 2016-09-17 DIAGNOSIS — I10 Essential (primary) hypertension: Secondary | ICD-10-CM

## 2016-09-17 DIAGNOSIS — S82201A Unspecified fracture of shaft of right tibia, initial encounter for closed fracture: Secondary | ICD-10-CM

## 2016-09-17 DIAGNOSIS — G9341 Metabolic encephalopathy: Secondary | ICD-10-CM

## 2016-09-17 DIAGNOSIS — E876 Hypokalemia: Secondary | ICD-10-CM

## 2016-09-17 DIAGNOSIS — F0280 Dementia in other diseases classified elsewhere without behavioral disturbance: Secondary | ICD-10-CM

## 2016-09-17 LAB — IRON AND TIBC
Iron: 27 ug/dL — ABNORMAL LOW (ref 28–170)
SATURATION RATIOS: 9 % — AB (ref 10.4–31.8)
TIBC: 316 ug/dL (ref 250–450)
UIBC: 289 ug/dL

## 2016-09-17 LAB — COMPREHENSIVE METABOLIC PANEL
ALBUMIN: 3 g/dL — AB (ref 3.5–5.0)
ALK PHOS: 71 U/L (ref 38–126)
ALT: 31 U/L (ref 14–54)
ANION GAP: 6 (ref 5–15)
AST: 21 U/L (ref 15–41)
BUN: 11 mg/dL (ref 6–20)
CALCIUM: 8.2 mg/dL — AB (ref 8.9–10.3)
CO2: 26 mmol/L (ref 22–32)
Chloride: 108 mmol/L (ref 101–111)
Creatinine, Ser: 0.73 mg/dL (ref 0.44–1.00)
Glucose, Bld: 111 mg/dL — ABNORMAL HIGH (ref 65–99)
POTASSIUM: 3.4 mmol/L — AB (ref 3.5–5.1)
Sodium: 140 mmol/L (ref 135–145)
TOTAL PROTEIN: 5.9 g/dL — AB (ref 6.5–8.1)
Total Bilirubin: 0.4 mg/dL (ref 0.3–1.2)

## 2016-09-17 LAB — RETICULOCYTES
RBC.: 3.35 MIL/uL — ABNORMAL LOW (ref 3.87–5.11)
RETIC COUNT ABSOLUTE: 70.4 10*3/uL (ref 19.0–186.0)
Retic Ct Pct: 2.1 % (ref 0.4–3.1)

## 2016-09-17 LAB — VITAMIN B12: VITAMIN B 12: 865 pg/mL (ref 180–914)

## 2016-09-17 LAB — CBC
HEMATOCRIT: 25.8 % — AB (ref 36.0–46.0)
HEMOGLOBIN: 8.7 g/dL — AB (ref 12.0–15.0)
MCH: 30.2 pg (ref 26.0–34.0)
MCHC: 33.7 g/dL (ref 30.0–36.0)
MCV: 89.6 fL (ref 78.0–100.0)
Platelets: 199 10*3/uL (ref 150–400)
RBC: 2.88 MIL/uL — AB (ref 3.87–5.11)
RDW: 13.5 % (ref 11.5–15.5)
WBC: 10.4 10*3/uL (ref 4.0–10.5)

## 2016-09-17 LAB — FERRITIN: FERRITIN: 78 ng/mL (ref 11–307)

## 2016-09-17 LAB — STREP PNEUMONIAE URINARY ANTIGEN: Strep Pneumo Urinary Antigen: NEGATIVE

## 2016-09-17 LAB — MRSA PCR SCREENING: MRSA BY PCR: NEGATIVE

## 2016-09-17 LAB — FOLATE: FOLATE: 36.6 ng/mL (ref >5.9)

## 2016-09-17 MED ORDER — POTASSIUM CHLORIDE CRYS ER 20 MEQ PO TBCR
40.0000 meq | EXTENDED_RELEASE_TABLET | Freq: Once | ORAL | Status: AC
  Administered 2016-09-17: 40 meq via ORAL
  Filled 2016-09-17: qty 2

## 2016-09-17 NOTE — Progress Notes (Signed)
PROGRESS NOTE    [name]  [name]:[id number] DOB: 1921-01-17 DOA: 09/15/2016 PCP: Ron Parker, MD   Chief Complaint  Patient presents with  . Fall    Brief Narrative:  HPI On 09/16/2016 by Dr. Tobe Sos is a 83 y.o. female with medical history significant of CKD, arthritis, anemia, HTN, hypothyroidism, right hip fracture, dementia, depression, who presented to the ED after she sustained a witnessed fall in the facility. Initially patient was pain free, but later started complaining of left leg pain. Assessment & Plan   Fall with left tibial and fibular fractures -Continue to immobilize with splint and provide pain control -Orthopedic surgery consulted and appreciated, felt findings on xray may indicate an old fracture. No tenderness on palpation. WBAT on the left foot, may use Cam Walker if pain with ambulation. F/up with ortho as needed -PT consulted and recommended SNF  Leukocytosis/Pneumonia -leukocytosis of 17 on admission, currently normal -CXR showed Mild right base infiltrate -UA unremarkable for infection, trace leukocytes, rare bacteria -given one dose of Vancomycin and Zosyn IV in the ED -Continue azithromycin and Rocephin -Currently afebrile  Dementia  -Monitor for safety, provide supportive care and assistance with ADL -Continue Depakote  Hypertension -currently stable -Not on medications at home -Continue IV hydralazine as needed  Hypothyroidism  -continue Synthroid  Normocytic Anemia -Baseline hemoglobin approximately 12, hemoglobin currently 8.7 -?dilutional, patient was receiving IVF -Continue to monitor CBC -Obtain anemia panel, FOBT  Hypokalemia -replace and monitor BMP  Acute encephalopathy -Possible due to pneumonia/dementia -Currently awake and alert, oriented to self only. -It seems that patient had become lethargic earlier today -Will continue to monitor closely   DVT Prophylaxis  Heparin  Code Status:  DNR  Family Communication: None at bedside  Disposition Plan: Admitted. Possible discharge back to ALF vs SNF  Consultants Orthopedic surgery  Procedures  None  Antibiotics   Anti-infectives    Start     Dose/Rate Route Frequency Ordered Stop   09/16/16 1200  cefTRIAXone (ROCEPHIN) 1 g in dextrose 5 % 50 mL IVPB     1 g 100 mL/hr over 30 Minutes Intravenous Every 24 hours 09/16/16 1027     09/16/16 1130  azithromycin (ZITHROMAX) 500 mg in dextrose 5 % 250 mL IVPB     500 mg 250 mL/hr over 60 Minutes Intravenous Every 24 hours 09/16/16 1027     09/16/16 0315  vancomycin (VANCOCIN) IVPB 1000 mg/200 mL premix     1,000 mg 200 mL/hr over 60 Minutes Intravenous  Once 09/16/16 0309 09/16/16 0430   09/16/16 0100  piperacillin-tazobactam (ZOSYN) IVPB 3.375 g     3.375 g 12.5 mL/hr over 240 Minutes Intravenous  Once 09/16/16 0056 09/16/16 0553      Subjective:   [name] seen and examined today.  Patient currently has dementia. Currently has no complaints other than leg pain. Denies chest pain, shortness of breath. Is happy she was able to eat.  Objective:   Vitals:   09/16/16 1447 09/16/16 2120 09/17/16 0513 09/17/16 1350  BP: (!) 144/46 (!) 155/60 (!) 116/51 (!) 157/58  Pulse: 71 75 64 69  Resp: 18 16 16 17   Temp: 97.7 F (36.5 C) 98.8 F (37.1 C) 97.7 F (36.5 C) 98.7 F (37.1 C)  TempSrc: Axillary Oral Oral Axillary  SpO2: 99% 95% 96% 96%  Weight:      Height:        Intake/Output Summary (Last 24 hours) at 09/17/16 1518 Last data filed  at 09/17/16 1313  Gross per 24 hour  Intake          1776.25 ml  Output                0 ml  Net          1776.25 ml   Filed Weights   09/16/16 0018  Weight: 81.6 kg (180 lb)    Exam  General: Well developed, well nourished, NAD, appears stated age  HEENT: NCAT,  mucous membranes moist.   Cardiovascular: S1 S2 auscultated, no rubs, murmurs or gallops. Regular rate and rhythm.  Respiratory: Clear to auscultation  bilaterally with equal chest rise  Abdomen: Soft, nontender, nondistended, + bowel sounds  Extremities: warm dry without cyanosis clubbing or edema, left leg TTP (per patient)  Neuro: AAOx1 (self only), nonfocal. Has dementia  Psych: Appropriate mood and affect   Data Reviewed: I have personally reviewed following labs and imaging studies  CBC:  Recent Labs Lab 09/12/16 0800 09/16/16 0018 09/16/16 0906 09/17/16 0453  WBC 11.5* 17.3* 12.9* 10.4  NEUTROABS 9.0* 14.7*  --   --   HGB 12.0 10.0* 9.0* 8.7*  HCT 35.6* 29.9* 27.3* 25.8*  MCV 88.1 88.7 86.9 89.6  PLT 223 224 207 199   Basic Metabolic Panel:  Recent Labs Lab 09/12/16 0800 09/16/16 0018 09/16/16 0906 09/17/16 0453  NA 142 143  --  140  K 4.4 3.9  --  3.4*  CL 106 106  --  108  CO2 29 27  --  26  GLUCOSE 106* 134*  --  111*  BUN 19 20  --  11  CREATININE 0.75 0.74 0.82 0.73  CALCIUM 9.1 9.0  --  8.2*   GFR: Estimated Creatinine Clearance: 46.3 mL/min (by C-G formula based on SCr of 0.73 mg/dL). Liver Function Tests:  Recent Labs Lab 09/17/16 0453  AST 21  ALT 31  ALKPHOS 71  BILITOT 0.4  PROT 5.9*  ALBUMIN 3.0*   No results for input(s): LIPASE, AMYLASE in the last 168 hours. No results for input(s): AMMONIA in the last 168 hours. Coagulation Profile:  Recent Labs Lab 09/16/16 0334  INR 1.11   Cardiac Enzymes: No results for input(s): CKTOTAL, CKMB, CKMBINDEX, TROPONINI in the last 168 hours. BNP (last 3 results) No results for input(s): PROBNP in the last 8760 hours. HbA1C: No results for input(s): HGBA1C in the last 72 hours. CBG: No results for input(s): GLUCAP in the last 168 hours. Lipid Profile: No results for input(s): CHOL, HDL, LDLCALC, TRIG, CHOLHDL, LDLDIRECT in the last 72 hours. Thyroid Function Tests: No results for input(s): TSH, T4TOTAL, FREET4, T3FREE, THYROIDAB in the last 72 hours. Anemia Panel: No results for input(s): VITAMINB12, FOLATE, FERRITIN, TIBC, IRON,  RETICCTPCT in the last 72 hours. Urine analysis:    Component Value Date/Time   COLORURINE YELLOW 09/16/2016 0801   APPEARANCEUR CLEAR 09/16/2016 0801   LABSPEC 1.024 09/16/2016 0801   PHURINE 5.0 09/16/2016 0801   GLUCOSEU NEGATIVE 09/16/2016 0801   GLUCOSEU NEGATIVE 04/06/2014 1127   HGBUR NEGATIVE 09/16/2016 0801   BILIRUBINUR NEGATIVE 09/16/2016 0801   BILIRUBINUR small 03/14/2014 1151   KETONESUR NEGATIVE 09/16/2016 0801   PROTEINUR NEGATIVE 09/16/2016 0801   UROBILINOGEN 0.2 03/23/2015 0053   NITRITE NEGATIVE 09/16/2016 0801   LEUKOCYTESUR TRACE (A) 09/16/2016 0801   Sepsis Labs: @LABRCNTIP (procalcitonin:4,lacticidven:4)  ) Recent Results (from the past 240 hour(s))  MRSA PCR Screening     Status: None   Collection Time:  09/17/16  6:20 AM  Result Value Ref Range Status   MRSA by PCR NEGATIVE NEGATIVE Final    Comment:        The GeneXpert MRSA Assay (FDA approved for NASAL specimens only), is one component of a comprehensive MRSA colonization surveillance program. It is not intended to diagnose MRSA infection nor to guide or monitor treatment for MRSA infections.       Radiology Studies: Dg Chest 1 View  Result Date: 09/16/2016 CLINICAL DATA:  Leukocytosis.  Hypoxia.  Fall. EXAM: CHEST 1 VIEW COMPARISON:  11/24/ 2017.  01/09/2016. FINDINGS: Mediastinum and hilar structures are normal. Stable mild cardiomegaly. No pulmonary venous congestion. Mild right infiltrate noted. No pleural effusion or pneumothorax. Thoracic spine scoliosis and degenerative change. Stable postsurgical changes proximal right humerus. IMPRESSION: Mild right base infiltrate suggesting pneumonia . Follow-up exam to demonstrate clearing suggested. Electronically Signed   By: Maisie Fus  Register   On: 09/16/2016 08:55   Dg Knee 2 Views Left  Result Date: 09/16/2016 CLINICAL DATA:  Unwitnessed fall pain below the patella EXAM: LEFT KNEE - 1-2 VIEW COMPARISON:  None. FINDINGS: Vascular  calcifications. Joint space calcifications medially and laterally. No fracture or malalignment. Trace suprapatellar effusion. Mild narrowing of the medial compartment and patellofemoral compartments with bony spurring. IMPRESSION: 1. No acute osseous abnormality 2. Chondrocalcinosis 3. Trace suprapatellar effusion Electronically Signed   By: Jasmine Pang M.D.   On: 09/16/2016 01:31   Dg Tibia/fibula Left  Result Date: 09/16/2016 CLINICAL DATA:  Fall with pain EXAM: LEFT TIBIA AND FIBULA - 2 VIEW COMPARISON:  None. FINDINGS: Soft tissue calcifications. Mild diffuse subcutaneous edema. Possible anterior cortical fracture of the distal tibia. Small plantar calcaneal spur. Possible tiny avulsion fibular malleolar tip IMPRESSION: 1. Possible cortical avulsion fracture distal anterior tibia. 2. Possible tiny chip fracture off the lateral fibular malleolus. Electronically Signed   By: Jasmine Pang M.D.   On: 09/16/2016 01:33     Scheduled Meds: . divalproex  125 mg Oral TID  . escitalopram  5 mg Oral Daily  . heparin  5,000 Units Subcutaneous Q8H  . levothyroxine  50 mcg Oral QAC breakfast  . multivitamin with minerals  1 tablet Oral Daily  . rivastigmine  1.5 mg Oral BID  . senna  2 tablet Oral BID  . traZODone  50 mg Oral QHS   Continuous Infusions: . azithromycin 500 mg (09/17/16 1133)  . cefTRIAXone (ROCEPHIN)  IV 1 g (09/17/16 1345)     LOS: 1 day   Time Spent in minutes   30 minutes  Ramata Strothman D.O. on 09/17/2016 at 3:18 PM  Between 7am to 7pm - Pager - [phone]  After 7pm go to [email or web address] - password [email or web address]  And look for the night coverage person covering for me after hours  [facility] Office  [phone]

## 2016-09-17 NOTE — Clinical Social Work Note (Signed)
Clinical Social Work Assessment  Patient Details  Name: [name] MRN: [id number] Date of Birth: [birth year]  Date of referral:  09/17/16               Reason for consult:  Facility Placement, Discharge Planning                Permission sought to share information with:  Facility Industrial/product designer granted to share information::  Yes, Verbal Permission Granted  Name::        Agency::     Relationship::     Contact Information:     Housing/Transportation Living arrangements for the past 2 months:  Assisted Living Facility Source of Information:  Facility, Adult Children Patient Interpreter Needed:  None Criminal Activity/Legal Involvement Pertinent to Current Situation/Hospitalization:  No - Comment as needed Significant Relationships:  Adult Children Lives with:  Facility Resident Do you feel safe going back to the place where you live?  Yes Need for family participation in patient care:  Yes (Comment)  Care giving concerns:  No concerns reported by pt's son.   Social Worker assessment / plan:  Pt hospitalized on 09/16/16 from [facility] after a fall. Ortho consulted and findings on x-ray may indicate an old fx. Surgery has not been recommended. PT eval pending. Pt has dementia and is unable to participate in dc planning. CSW spoke to pt's son, [name] [phone] this am. [name] is planning for pt to return to ALF at d/c. [employer] contacted and clinicals sent for review. CSW will continue to follow to assist with d/c planning.   Employment status:  Retired Database administrator, [employer] In [place] PT Recommendations:  Not assessed at this time Information / Referral to community resources:     Patient/Family's Response to care:  Son is expecting pt to return to ALF.  Patient/Family's Understanding of and Emotional Response to Diagnosis, Current Treatment, and Prognosis: Pt's son will speak to nsg/MD for medical update when he  visits today. He is requesting further details relating to fx's. Pt's son appreciates assistance with d/c planning provided by CSW.  Emotional Assessment Appearance:  Appears stated age Attitude/Demeanor/Rapport:  Unable to Assess Affect (typically observed):  Unable to Assess Orientation:  Oriented to Self Alcohol / Substance use:  Not Applicable Psych involvement (Current and /or in the community):  No (Comment)  Discharge Needs  Concerns to be addressed:  Discharge Planning Concerns Readmission within the last 30 days:  No Current discharge risk:  None Barriers to Discharge:  No Barriers Identified   [name]  [phone] 09/17/2016, 10:34 AM

## 2016-09-17 NOTE — Evaluation (Signed)
Physical Therapy Evaluation Patient Details Name: [name] MRN: [id number] DOB: January 27, 1921 Today's Date: 09/17/2016   History of Present Illness  Pt is a 83 year old female with hx of dementia, falls, R hip hemiarthroplasty due to R femoral neck fracture 09/20/15 and admitted from ALF after sustaining unwitnessed fall.  Per ortho note, "Findings on x-ray may indicate an old fracture" and WBAT recommended.  Clinical Impression  Pt admitted with above diagnosis. Pt currently with functional limitations due to the deficits listed below (see PT Problem List).  Pt will benefit from skilled PT to increase their independence and safety with mobility to allow discharge to the venue listed below.  Pt very lethargic this afternoon and requiring increased assist for bed mobility.  Pt requested return to supine shortly after achieving sitting EOB.  Pt from ALF so recommend SNF if ALF unable to provide current assist level.  CAM walker recommended by ortho if pt with increased pain however unable to stand today.  Pt would likely not tolerate weight of CAM walker well especially with hx of dementia.     Follow Up Recommendations SNF;Supervision/Assistance - 24 hour    Equipment Recommendations  None recommended by PT (TBD next venue)    Recommendations for Other Services       Precautions / Restrictions Precautions Precautions: Fall Precaution Comments: can get CAM walker if pain with transfers per ortho note Restrictions Other Position/Activity Restrictions: WBAT L LE      Mobility  Bed Mobility Overal bed mobility: Needs Assistance Bed Mobility: Rolling;Sidelying to Sit;Sit to Sidelying Rolling: Mod assist Sidelying to sit: Total assist;+2 for physical assistance     Sit to sidelying: Total assist;+2 for physical assistance General bed mobility comments: multimodal cues for technique, pt able to assist more with rolling, increased assist for sidelying to sit and return to sidelying (bed  mobility towards right side of bed)  Transfers                    Ambulation/Gait                Stairs            Wheelchair Mobility    Modified Rankin (Stroke Patients Only)       Balance Overall balance assessment: History of Falls;Needs assistance Sitting-balance support: Bilateral upper extremity supported;Feet supported Sitting balance-Leahy Scale: Zero Sitting balance - Comments: requires external support due to left lateral lean Postural control: Left lateral lean                                   Pertinent Vitals/Pain Pain Assessment: Faces Faces Pain Scale: Hurts a little bit Pain Location: L LE Pain Descriptors / Indicators: Grimacing Pain Intervention(s): Limited activity within patient's tolerance;Repositioned;Monitored during session    Home Living Family/patient expects to be discharged to:: Assisted living                      Prior Function Level of Independence: Needs assistance   Gait / Transfers Assistance Needed: per RN, requires assist for transfers at ALF, does not seem to be ambulatory, pt poor historian           Hand Dominance        Extremity/Trunk Assessment        Lower Extremity Assessment Lower Extremity Assessment: Generalized weakness;LLE deficits/detail;Difficult to assess due to impaired cognition LLE  Deficits / Details: extensive ecchymosis to L hip and lateral thigh area       Communication   Communication: Other (comment) (difficult to assess due to lethargy)  Cognition Arousal/Alertness: Lethargic Behavior During Therapy: Flat affect Overall Cognitive Status: Difficult to assess                                 General Comments: pt with hx of dementia, also very lethargic this afternoon      General Comments      Exercises     Assessment/Plan    PT Assessment Patient needs continued PT services  PT Problem List Decreased strength;Decreased  mobility;Decreased cognition;Decreased knowledge of use of DME;Decreased balance;Pain       PT Treatment Interventions DME instruction;Functional mobility training;Therapeutic exercise;Therapeutic activities;Patient/family education;Wheelchair mobility training    PT Goals (Current goals can be found in the Care Plan section)  Acute Rehab PT Goals PT Goal Formulation: Patient unable to participate in goal setting Time For Goal Achievement: 09/24/16 Potential to Achieve Goals: Fair    Frequency Min 2X/week   Barriers to discharge        Co-evaluation PT/OT/SLP Co-Evaluation/Treatment: Yes Reason for Co-Treatment: For patient/therapist safety;To address functional/ADL transfers PT goals addressed during session: Mobility/safety with mobility OT goals addressed during session: ADL's and self-care       End of Session   Activity Tolerance: Patient limited by lethargy Patient left: in bed;with call bell/phone within reach;Other (comment) (telesitter)   PT Visit Diagnosis: History of falling (Z91.81)    Time: 4098-1191 PT Time Calculation (min) (ACUTE ONLY): 20 min   Charges:   PT Evaluation $PT Eval Moderate Complexity: 1 Procedure     PT G Codes:        Zenovia Jarred, PT, DPT 09/17/2016 Pager: [phone]   Maida Sale E 09/17/2016, 3:15 PM

## 2016-09-17 NOTE — Evaluation (Signed)
Occupational Therapy Evaluation Patient Details Name: [name] MRN: [id number] DOB: 1921-02-19 Today's Date: 09/17/2016    History of Present Illness Pt is a 83 year old female with hx of dementia, falls, R hip hemiarthroplasty due to R femoral neck fracture 09/20/15 and admitted from ALF after sustaining unwitnessed fall.  Per ortho note, "Findings on x-ray may indicate an old fracture" and WBAT recommended.   Clinical Impression   Pt was admitted for the above. At time of evaluation, pt was lethargic. She needs mostly total A for adls; she participated very minimally.  Anticipate she will need SNF. She was from ALF, and I am unsure of amount of assistance they provided for her.  Goals in acute are for min to mod +2 assistance    Follow Up Recommendations  SNF    Equipment Recommendations   (defer to next venue)    Recommendations for Other Services       Precautions / Restrictions Precautions Precautions: Fall Precaution Comments: can get CAM walker if pain with transfers per ortho note Restrictions Other Position/Activity Restrictions: WBAT L LE      Mobility Bed Mobility Overal bed mobility: Needs Assistance Bed Mobility: Rolling;Sidelying to Sit;Sit to Sidelying Rolling: Mod assist Sidelying to sit: Total assist;+2 for physical assistance     Sit to sidelying: Total assist;+2 for physical assistance General bed mobility comments: multimodal cues for technique, pt able to assist more with rolling, increased assist for sidelying to sit and return to sidelying (bed mobility towards right side of bed)  Transfers                      Balance Overall balance assessment: History of Falls;Needs assistance Sitting-balance support: Bilateral upper extremity supported;Feet supported Sitting balance-Leahy Scale: Zero Sitting balance - Comments: requires external support due to left lateral lean Postural control: Left lateral lean                                 ADL either performed or assessed with clinical judgement   ADL Overall ADL's : Needs assistance/impaired     Grooming: Wash/dry face;Total assistance;Sitting Grooming Details (indicate cue type and reason): pt 10%.  Started with hand over hand assistance Upper Body Bathing: Total assistance;Sitting   Lower Body Bathing: Total assistance;Bed level   Upper Body Dressing : Total assistance;Sitting Upper Body Dressing Details (indicate cue type and reason): pt  lifted LUE to place into gown, but needed guidance Lower Body Dressing: Total assistance;+2 for physical assistance;Bed level                 General ADL Comments: pt was very sleepy during the evaluation.  Sat at EOB with assistance.  Little participation in ADLs--may improve when she is more alert     Vision Patient Visual Report:  (eyes closed most of session)       Perception     Praxis      Pertinent Vitals/Pain Pain Assessment: Faces Faces Pain Scale: Hurts a little bit Pain Location: L LE Pain Descriptors / Indicators: Grimacing Pain Intervention(s): Limited activity within patient's tolerance;Repositioned;Monitored during session     Hand Dominance     Extremity/Trunk Assessment Upper Extremity Assessment Upper Extremity Assessment: Generalized weakness   Lower Extremity Assessment Lower Extremity Assessment: Generalized weakness;LLE deficits/detail;Difficult to assess due to impaired cognition LLE Deficits / Details: extensive ecchymosis to L hip and lateral thigh area  Communication Communication Communication: Other (comment) (difficult to assess due to lethargy)   Cognition Arousal/Alertness: Lethargic Behavior During Therapy: Flat affect Overall Cognitive Status: Difficult to assess                                 General Comments: pt with hx of dementia, also very lethargic this afternoon   General Comments       Exercises     Shoulder Instructions       Home Living Family/patient expects to be discharged to:: Assisted living                                        Prior Functioning/Environment Level of Independence: Needs assistance  Gait / Transfers Assistance Needed: per RN, requires assist for transfers at ALF, does not seem to be ambulatory, pt poor historian              OT Problem List: Decreased strength;Decreased activity tolerance;Impaired balance (sitting and/or standing);Decreased safety awareness;Decreased cognition      OT Treatment/Interventions: Self-care/ADL training;DME and/or AE instruction;Patient/family education;Balance training;Cognitive remediation/compensation;Therapeutic activities    OT Goals(Current goals can be found in the care plan section) Acute Rehab OT Goals Patient Stated Goal: unable to state OT Goal Formulation: Patient unable to participate in goal setting Time For Goal Achievement: 09/24/16 Potential to Achieve Goals: Fair ADL Goals Pt Will Perform Eating: sitting;with min assist Pt Will Perform Grooming: with min assist;sitting Pt Will Perform Upper Body Bathing: with min assist;sitting Pt Will Perform Upper Body Dressing: with min assist;sitting Pt Will Transfer to Toilet: with mod assist;with +2 assist;stand pivot transfer;bedside commode Additional ADL Goal #1: pt will perform bed mobility with min A in preparation for adls Additional ADL Goal #2: pt will go from sit to stand with mod A +2 for adls and maintain standing for 2 minutes with min A  OT Frequency: Min 2X/week   Barriers to D/C:            Co-evaluation   Reason for Co-Treatment: For patient/therapist safety;To address functional/ADL transfers PT goals addressed during session: Mobility/safety with mobility OT goals addressed during session: ADL's and self-care      End of Session    Activity Tolerance: Patient limited by lethargy Patient left: in bed;with call bell/phone within reach  OT  Visit Diagnosis: Unsteadiness on feet (R26.81);History of falling (Z91.81)                Time: 1435-1456 OT Time Calculation (min): 21 min Charges:  OT General Charges $OT Visit: 1 Procedure OT Evaluation $OT Eval Moderate Complexity: 1 Procedure G-Codes:     Marica Otter, OTR/L [phone] 09/17/2016  Anu Stagner 09/17/2016, 3:36 PM

## 2016-09-18 LAB — BASIC METABOLIC PANEL
Anion gap: 8 (ref 5–15)
BUN: 12 mg/dL (ref 6–20)
CHLORIDE: 105 mmol/L (ref 101–111)
CO2: 26 mmol/L (ref 22–32)
Calcium: 8.4 mg/dL — ABNORMAL LOW (ref 8.9–10.3)
Creatinine, Ser: 0.8 mg/dL (ref 0.44–1.00)
GFR calc Af Amer: >60 mL/min (ref >60)
GFR calc non Af Amer: >60 mL/min (ref >60)
Glucose, Bld: 108 mg/dL — ABNORMAL HIGH (ref 65–99)
Potassium: 4.2 mmol/L (ref 3.5–5.1)
Sodium: 139 mmol/L (ref 135–145)

## 2016-09-18 LAB — CBC
HEMATOCRIT: 25.5 % — AB (ref 36.0–46.0)
HEMOGLOBIN: 8.8 g/dL — AB (ref 12.0–15.0)
MCH: 29.6 pg (ref 26.0–34.0)
MCHC: 34.5 g/dL (ref 30.0–36.0)
MCV: 85.9 fL (ref 78.0–100.0)
Platelets: 217 10*3/uL (ref 150–400)
RBC: 2.97 MIL/uL — ABNORMAL LOW (ref 3.87–5.11)
RDW: 13.1 % (ref 11.5–15.5)
WBC: 8.8 10*3/uL (ref 4.0–10.5)

## 2016-09-18 MED ORDER — AZITHROMYCIN 250 MG PO TABS: 250.0000 mg | ORAL_TABLET | Freq: Every day | ORAL | 0 refills | Status: DC

## 2016-09-18 MED ORDER — CEFUROXIME AXETIL 250 MG PO TABS: 250.0000 mg | ORAL_TABLET | Freq: Two times a day (BID) | ORAL | 0 refills | Status: DC

## 2016-09-18 MED ORDER — TRAMADOL HCL 50 MG PO TABS: 50.0000 mg | ORAL_TABLET | Freq: Four times a day (QID) | ORAL | 0 refills | Status: DC | PRN

## 2016-09-18 NOTE — Progress Notes (Signed)
CSW spoke with nsg at [facility] memory care unit this am to review PT recommendations. Nsg reports that pt was wc bound at facility. Pt is able to assist with transfers according to bedside hospital nsg. [facility] reports that they can manage pt's care at facility. Pt's son contacted and is in agreement with pt returning to [facility] once stable. MD contacted and reports pt is stable for d/c. Son has been updated and in agreement with d/c plan. CSW will assist with d/c planning back to [facility].  Cori Razor LCSW [phone]

## 2016-09-18 NOTE — [place] FL2 (Signed)
[place] MEDICAID FL2 LEVEL OF CARE SCREENING TOOL     IDENTIFICATION  Patient Name: [name] Birthdate: 1920/09/28 Sex: female Admission Date (Current Location): 09/15/2016  [facility] and [place] Number:  Producer, television/film/video and Address:  [facility],  [address], [place] [address]      Provider Number: [id number]  Attending Physician Name and Address:  Edsel Petrin, DO  Relative Name and Phone Number:       Current Level of Care: Hospital Recommended Level of Care: Memory Care Prior Approval Number:    Date Approved/Denied:   PASRR Number:    Discharge Plan: Other (Comment) (Memory Care Unit)    Current Diagnoses: Patient Active Problem List   Diagnosis Date Noted  . Tibial fracture 09/16/2016  . Left fibular fracture 09/16/2016  . Leukocytosis 09/16/2016  . Pressure ulcer 09/20/2015  . Displaced fracture of right femoral neck (HCC) 09/19/2015  . Altered mental status   . Confusion 06/26/2015  . Fall   . Aspiration pneumonia (HCC) 01/10/2015  . Acute encephalopathy 01/08/2015  . UTI (urinary tract infection) 01/08/2015  . 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 Recurrent falls 11/27/2014  . FTT (failure to thrive) in adult 10/26/2014  . Dehydration 10/26/2014  . Abnormal urinalysis 10/26/2014  . Normocytic anemia 10/26/2014  . Lump of skin of left upper extremity 09/07/2014  . Rash and nonspecific skin eruption 05/08/2014  . Syncope and collapse 11/30/2013  . Low back pain, episodic 11/20/2013  . Chronic kidney disease   . Insomnia 08/31/2013  . Arthritis 10/02/2012  . Valvular heart disease 09/04/2012  . Restless leg syndrome 06/06/2012  . Trochanteric bursitis 08/02/2011  . Dementia 08/02/2011  . Leg pain 03/28/2011  . Hypertension 09/22/2010  . Hypothyroidism 09/22/2010    Orientation RESPIRATION BLADDER Height & Weight     Self  Normal Incontinent Weight: 180 lb (81.6 kg) Height:  5\' 6"  (167.6  cm)  BEHAVIORAL SYMPTOMS/MOOD NEUROLOGICAL BOWEL NUTRITION STATUS  Other (Comment) (No behaviors)   Incontinent Diet (regular)  AMBULATORY STATUS COMMUNICATION OF NEEDS Skin   Total Care Verbally Normal                       Personal Care Assistance Level of Assistance  Bathing, Feeding, Dressing Bathing Assistance: Limited assistance Feeding assistance: Independent Dressing Assistance: Limited assistance     Functional Limitations Info  Sight, Hearing, Speech Sight Info: Adequate Hearing Info: Adequate Speech Info: Adequate    SPECIAL CARE FACTORS FREQUENCY                       Contractures Contractures Info: Not present    Additional Factors Info  Code Status Code Status Info: DNR             Current Medications (09/18/2016):  This is the current hospital active medication list Current Facility-Administered Medications  Medication Dose Route Frequency Provider Last Rate Last Dose  . acetaminophen (TYLENOL) tablet 650 mg  650 mg Oral Q6H PRN Lorretta Harp, MD   650 mg at 09/17/16 1615  . alum & mag hydroxide-simeth (MAALOX/MYLANTA) 200-200-20 MG/5ML suspension 30 mL  30 mL Oral Q6H PRN Lorretta Harp, MD      . azithromycin (ZITHROMAX) 500 mg in dextrose 5 % 250 mL IVPB  500 mg Intravenous Q24H Alba Cory, MD   Stopped at 09/17/16 1619  . cefTRIAXone (ROCEPHIN) 1 g in dextrose 5 % 50  mL IVPB  1 g Intravenous Q24H Belkys A Regalado, MD 100 mL/hr at 09/17/16 1345 1 g at 09/17/16 1345  . clonazePAM (KLONOPIN) tablet 0.5 mg  0.5 mg Oral BID PRN Belkys A Regalado, MD   0.5 mg at 09/16/16 1506  . divalproex (DEPAKOTE SPRINKLE) capsule 125 mg  125 mg Oral TID Lorretta Harp, MD   125 mg at 09/18/16 1043  . escitalopram (LEXAPRO) tablet 5 mg  5 mg Oral Daily Lorretta Harp, MD   5 mg at 09/18/16 1043  . guaiFENesin (ROBITUSSIN) 100 MG/5ML solution 200 mg  200 mg Oral Q6H PRN Lorretta Harp, MD      . heparin injection 5,000 Units  5,000 Units Subcutaneous [address] [place],  PA-C   5,000 Units at 09/18/16 1610  . hydrALAZINE (APRESOLINE) injection 5 mg  5 mg Intravenous Q6H PRN Isaiah Blakes, PA-C      . levothyroxine (SYNTHROID, LEVOTHROID) tablet 50 mcg  50 mcg Oral QAC breakfast Lorretta Harp, MD   50 mcg at 09/18/16 1043  . magnesium hydroxide (MILK OF MAGNESIA) suspension 30 mL  30 mL Oral QHS PRN Lorretta Harp, MD      . multivitamin with minerals tablet 1 tablet  1 tablet Oral Daily Lorretta Harp, MD   1 tablet at 09/18/16 1044  . ondansetron (ZOFRAN) injection 4 mg  4 mg Intravenous Q8H PRN Lorretta Harp, MD      . rivastigmine (EXELON) capsule 1.5 mg  1.5 mg Oral BID Lorretta Harp, MD   1.5 mg at 09/18/16 1042  . senna (SENOKOT) tablet 17.2 mg  2 tablet Oral BID Lorretta Harp, MD   17.2 mg at 09/18/16 1044  . traMADol (ULTRAM) tablet 50 mg  50 mg Oral Q6H PRN Lorretta Harp, MD   50 mg at 09/18/16 1041  . traZODone (DESYREL) tablet 50 mg  50 mg Oral QHS Lorretta Harp, MD   50 mg at 09/17/16 2124     Discharge Medications: Please see discharge summary for a list of discharge medications. Current Discharge Medication List       START taking these medications   Details  azithromycin (ZITHROMAX) 250 MG tablet Take 1 tablet (250 mg total) by mouth daily. Qty: 4 tablet, Refills: 0    cefUROXime (CEFTIN) 250 MG tablet Take 1 tablet (250 mg total) by mouth 2 (two) times daily with a meal. Qty: 8 tablet, Refills: 0         CONTINUE these medications which have CHANGED   Details  traMADol (ULTRAM) 50 MG tablet Take 1 tablet (50 mg total) by mouth every 6 (six) hours as needed for severe pain. Qty: 20 tablet, Refills: 0         CONTINUE these medications which have NOT CHANGED   Details  acetaminophen (TYLENOL) 500 MG tablet Take 500 mg by mouth every 4 (four) hours as needed for mild pain, moderate pain, fever or headache.     alum & mag hydroxide-simeth (MINTOX) 200-200-20 MG/5ML suspension Take 30 mLs by mouth every 6 (six) hours as needed for indigestion or  heartburn.     aspirin 81 MG chewable tablet Chew 81 mg by mouth daily.    clonazePAM (KLONOPIN) 0.5 MG tablet Take 1 tablet (0.5 mg total) by mouth 2 (two) times daily. Qty: 10 tablet, Refills: 0    divalproex (DEPAKOTE SPRINKLE) 125 MG capsule Take 125 mg by mouth 3 (three) times daily.     escitalopram (LEXAPRO) 5 MG tablet Take  5 mg by mouth daily.    guaifenesin (ROBITUSSIN) 100 MG/5ML syrup Take 200 mg by mouth every 6 (six) hours as needed for cough.     levothyroxine (SYNTHROID, LEVOTHROID) 50 MCG tablet Take 50 mcg by mouth daily before breakfast.     loperamide (IMODIUM) 2 MG capsule Take 2 mg by mouth every 3 (three) hours as needed for diarrhea or loose stools.     magnesium hydroxide (MILK OF MAGNESIA) 400 MG/5ML suspension Take 30 mLs by mouth at bedtime as needed for mild constipation.    Multiple Vitamins-Minerals (MULTIVITAMIN WITH MINERALS) tablet Take 1 tablet by mouth daily.    Neomycin-Bacitracin-Polymyxin (TRIPLE ANTIBIOTIC) [phone] OINT Apply 1 application topically 3 (three) times daily as needed (for skin tears/abrasions).     Nutritional Supplements (NUTRITIONAL DRINK) LIQD Take 1 Bottle by mouth 3 (three) times daily with meals. Mighty Shakes    rivastigmine (EXELON) 1.5 MG capsule Take 1.5 mg by mouth 2 (two) times daily.    senna (SENOKOT) 8.6 MG TABS tablet Take 2 tablets by mouth 2 (two) times daily.    traZODone (DESYREL) 50 MG tablet Take 50 mg by mouth at bedtime.     Relevant Imaging Results:  Relevant Lab Results:   Additional Information [id number]  Clarke Peretz, Dickey Gave, LCSW

## 2016-09-18 NOTE — Discharge Instructions (Signed)
Community-Acquired Pneumonia, Adult °Pneumonia is an infection of the lungs. One type of pneumonia can happen while a person is in a hospital. A different type can happen when a person is not in a hospital (community-acquired pneumonia). It is easy for this kind to spread from person to person. It can spread to you if you breathe near an infected person who coughs or sneezes. Some symptoms include: °· A dry cough. °· A wet (productive) cough. °· Fever. °· Sweating. °· Chest pain. °Follow these instructions at home: °· Take over-the-counter and prescription medicines only as told by your doctor. °¨ Only take cough medicine if you are losing sleep. °¨ If you were prescribed an antibiotic medicine, take it as told by your doctor. Do not stop taking the antibiotic even if you start to feel better. °· Sleep with your head and neck raised (elevated). You can do this by putting a few pillows under your head, or you can sleep in a recliner. °· Do not use tobacco products. These include cigarettes, chewing tobacco, and e-cigarettes. If you need help quitting, ask your doctor. °· Drink enough water to keep your pee (urine) clear or pale yellow. °A shot (vaccine) can help prevent pneumonia. Shots are often suggested for: °· People older than 81 years of age. °· People older than 81 years of age: °¨ Who are having cancer treatment. °¨ Who have long-term (chronic) lung disease. °¨ Who have problems with their body's defense system (immune system). °You may also prevent pneumonia if you take these actions: °· Get the flu (influenza) shot every year. °· Go to the dentist as often as told. °· Wash your hands often. If soap and water are not available, use hand sanitizer. °Contact a doctor if: °· You have a fever. °· You lose sleep because your cough medicine does not help. °Get help right away if: °· You are short of breath and it gets worse. °· You have more chest pain. °· Your sickness gets worse. This is very serious if: °¨ You  are an older adult. °¨ Your body's defense system is weak. °· You cough up blood. °This information is not intended to replace advice given to you by your health care provider. Make sure you discuss any questions you have with your health care provider. °Document Released: 11/05/2007 Document Revised: 10/25/2015 Document Reviewed: 09/13/2014 °Elsevier Interactive Patient Education © 2017 [employer] ° °

## 2016-09-18 NOTE — Progress Notes (Signed)
Pt / son are in agreement with d/c to [facility] today. Clydie Braun ( NSG ) from [facility] assessed pt for readmission today and has provided her approval. D/C Summary and FL2 sent to facility for review. Scripts provided to Clydie Braun during her assessment visit. [facility] transport is required. Son is aware out of pocket costs may be associated with [facility] transport. Medical necessity form completed. # for report provided to nsg.  Cori Razor LCSW [phone]

## 2016-09-18 NOTE — Discharge Summary (Addendum)
Physician Discharge Summary  [name] URK:[id number] DOB: 10/17/1920 DOA: 09/15/2016  PCP: Ron Parker, MD  Admit date: 09/15/2016 Discharge date: 09/18/2016  Time spent: 45 minutes  Recommendations for Outpatient Follow-up:  Patient will be discharged to [facility].  Patient will need to follow up with primary care provider within one week of discharge.  Follow up with orthopedics, Dr. Charlann Boxer as needed. Patient should continue medications as prescribed.  Patient should follow a heart healthy diet.   Discharge Diagnoses:  Fall with left tibial and fibular fractures Leukocytosis/Pneumonia Dementia  Hypertension Hypothyroidism  Normocytic Anemia Hypokalemia Acute encephalopathy  Discharge Condition: Stable  Diet recommendation: heart healthy  Filed Weights   09/16/16 0018  Weight: 81.6 kg (180 lb)    History of present illness:  On 09/16/2016 by Dr. Delford Field Bameis an 83 y.o.femalewith medical history significant of CKD, arthritis, anemia, HTN, hypothyroidism, right hip fracture, dementia, depression, who presented to the ED after she sustained a witnessed fall in the facility. Initially patient was pain free, but later started complaining of left leg pain.  Hospital Course:  Fall with left tibial and fibular fractures -Continue to immobilize with splint and provide pain control -Orthopedic surgery consulted and appreciated, felt findings on xray may indicate an old fracture. No tenderness on palpation. WBAT on the left foot, may use Cam Walker if pain with ambulation. F/up with ortho as needed -PT consulted and recommended 24 hour supervision and assistance.   Leukocytosis/Pneumonia -leukocytosis of 17 on admission, currently normal -CXR showed Mild right base infiltrate -UA unremarkable for infection, trace leukocytes, rare bacteria -given one dose of Vancomycin and Zosyn IV in the ED -Was placed on azithromycin and Rocephin, will  discharge with azithromycin and ceftin -Currently afebrile  Dementia  -Monitor for safety, provide supportive care and assistance with ADL -Continue Depakote  Hypertension -currently stable -Not on medications athome -Continue IV hydralazine as needed  Hypothyroidism  -continue Synthroid  Normocytic Anemia -Baseline hemoglobin approximately 12, hemoglobin currently 8.7 -?dilutional, patient was receiving IVF -Continue to monitor CBC -Obtain anemia panel, FOBT  Hypokalemia -replace and monitor BMP  Acute encephalopathy -Possible due to pneumonia/dementia -Currently awake and alert, oriented to self only. -seems to be at baseline  Code Status: DNR  Consultants Orthopedic surgery  Procedures  None  Discharge Exam: Vitals:   09/17/16 2128 09/18/16 0545  BP: (!) 141/48 (!) 141/65  Pulse: 67 72  Resp: 16 17  Temp: 98.8 F (37.1 C) 99.1 F (37.3 C)   Patient currently has dementia. Currently has no complaints other than leg pain. Denies chest pain, shortness of breath.  Exam  General: Well developed, well nourished, NAD  HEENT: NCAT,  mucous membranes moist.   Cardiovascular: S1 S2 auscultated, RRR  Respiratory: Diminished but clear  Abdomen: Soft, nontender, nondistended, + bowel sounds  Extremities: warm dry without cyanosis clubbing or edema  Neuro: AAOx1 (self only), nonfocal. Has dementia   Psych: Appropriate mood and affect  Discharge Instructions Discharge Instructions    Discharge instructions    Complete by:  As directed    Patient will be discharged to [facility].  Patient will need to follow up with primary care provider within one week of discharge.  Patient should continue medications as prescribed.  Patient should follow a heart healthy diet.     Current Discharge Medication List    START taking these medications   Details  azithromycin (ZITHROMAX) 250 MG tablet Take 1 tablet (250  mg total) by mouth  daily. Qty: 4 tablet, Refills: 0    cefUROXime (CEFTIN) 250 MG tablet Take 1 tablet (250 mg total) by mouth 2 (two) times daily with a meal. Qty: 8 tablet, Refills: 0      CONTINUE these medications which have CHANGED   Details  traMADol (ULTRAM) 50 MG tablet Take 1 tablet (50 mg total) by mouth every 6 (six) hours as needed for severe pain. Qty: 20 tablet, Refills: 0      CONTINUE these medications which have NOT CHANGED   Details  acetaminophen (TYLENOL) 500 MG tablet Take 500 mg by mouth every 4 (four) hours as needed for mild pain, moderate pain, fever or headache.     alum & mag hydroxide-simeth (MINTOX) 200-200-20 MG/5ML suspension Take 30 mLs by mouth every 6 (six) hours as needed for indigestion or heartburn.     aspirin 81 MG chewable tablet Chew 81 mg by mouth daily.    clonazePAM (KLONOPIN) 0.5 MG tablet Take 1 tablet (0.5 mg total) by mouth 2 (two) times daily. Qty: 10 tablet, Refills: 0    divalproex (DEPAKOTE SPRINKLE) 125 MG capsule Take 125 mg by mouth 3 (three) times daily.     escitalopram (LEXAPRO) 5 MG tablet Take 5 mg by mouth daily.    guaifenesin (ROBITUSSIN) 100 MG/5ML syrup Take 200 mg by mouth every 6 (six) hours as needed for cough.     levothyroxine (SYNTHROID, LEVOTHROID) 50 MCG tablet Take 50 mcg by mouth daily before breakfast.     loperamide (IMODIUM) 2 MG capsule Take 2 mg by mouth every 3 (three) hours as needed for diarrhea or loose stools.     magnesium hydroxide (MILK OF MAGNESIA) 400 MG/5ML suspension Take 30 mLs by mouth at bedtime as needed for mild constipation.    Multiple Vitamins-Minerals (MULTIVITAMIN WITH MINERALS) tablet Take 1 tablet by mouth daily.    Neomycin-Bacitracin-Polymyxin (TRIPLE ANTIBIOTIC) [phone] OINT Apply 1 application topically 3 (three) times daily as needed (for skin tears/abrasions).     Nutritional Supplements (NUTRITIONAL DRINK) LIQD Take 1 Bottle by mouth 3 (three) times daily with meals. Mighty Shakes     rivastigmine (EXELON) 1.5 MG capsule Take 1.5 mg by mouth 2 (two) times daily.    senna (SENOKOT) 8.6 MG TABS tablet Take 2 tablets by mouth 2 (two) times daily.    traZODone (DESYREL) 50 MG tablet Take 50 mg by mouth at bedtime.        Allergies  Allergen Reactions  . Morphine And Related Other (See Comments)    Reaction:  Unknown   . Codeine Rash  . Hydrocodone Rash   Follow-up Information    Shelda Pal, MD Follow up.   Specialty:  Orthopedic Surgery Why:  Follow up only as needed. Contact information: [address] [place] [address] [phone]        Ron Parker, MD. Schedule an appointment as soon as possible for a visit in 1 week(s).   Specialty:  Internal Medicine Why:  Hospital follow up Contact information: [address] [place] [facility] [address] [phone]            The results of significant diagnostics from this hospitalization (including imaging, microbiology, ancillary and laboratory) are listed below for reference.    Significant Diagnostic Studies: Dg Chest 1 View  Result Date: 09/16/2016 CLINICAL DATA:  Leukocytosis.  Hypoxia.  Fall. EXAM: CHEST 1 VIEW COMPARISON:  11/24/ 2017.  01/09/2016. FINDINGS: Mediastinum and hilar structures are normal. Stable  mild cardiomegaly. No pulmonary venous congestion. Mild right infiltrate noted. No pleural effusion or pneumothorax. Thoracic spine scoliosis and degenerative change. Stable postsurgical changes proximal right humerus. IMPRESSION: Mild right base infiltrate suggesting pneumonia . Follow-up exam to demonstrate clearing suggested. Electronically Signed   By: Maisie Fus  Register   On: 09/16/2016 08:55   Dg Thoracic Spine 2 View  Result Date: 09/12/2016 CLINICAL DATA:  Acute onset of upper back pain.  Initial encounter. EXAM: THORACIC SPINE 2 VIEWS COMPARISON:  Chest radiograph performed 06/22/2016 FINDINGS: There is no evidence of fracture or subluxation. Vertebral bodies  demonstrate normal height and alignment. Intervertebral disc spaces are preserved. The visualized portions of both lungs are clear. The mediastinum is unremarkable in appearance. IMPRESSION: No evidence of fracture or subluxation along the thoracic spine. Electronically Signed   By: Roanna Raider M.D.   On: 09/12/2016 05:05   Dg Knee 2 Views Left  Result Date: 09/16/2016 CLINICAL DATA:  Unwitnessed fall pain below the patella EXAM: LEFT KNEE - 1-2 VIEW COMPARISON:  None. FINDINGS: Vascular calcifications. Joint space calcifications medially and laterally. No fracture or malalignment. Trace suprapatellar effusion. Mild narrowing of the medial compartment and patellofemoral compartments with bony spurring. IMPRESSION: 1. No acute osseous abnormality 2. Chondrocalcinosis 3. Trace suprapatellar effusion Electronically Signed   By: Jasmine Pang M.D.   On: 09/16/2016 01:31   Dg Tibia/fibula Left  Result Date: 09/16/2016 CLINICAL DATA:  Fall with pain EXAM: LEFT TIBIA AND FIBULA - 2 VIEW COMPARISON:  None. FINDINGS: Soft tissue calcifications. Mild diffuse subcutaneous edema. Possible anterior cortical fracture of the distal tibia. Small plantar calcaneal spur. Possible tiny avulsion fibular malleolar tip IMPRESSION: 1. Possible cortical avulsion fracture distal anterior tibia. 2. Possible tiny chip fracture off the lateral fibular malleolus. Electronically Signed   By: Jasmine Pang M.D.   On: 09/16/2016 01:33   Ct Head Wo Contrast  Result Date: 09/12/2016 CLINICAL DATA:  Found down EXAM: CT HEAD WITHOUT CONTRAST CT CERVICAL SPINE WITHOUT CONTRAST TECHNIQUE: Multidetector CT imaging of the head and cervical spine was performed following the standard protocol without intravenous contrast. Multiplanar CT image reconstructions of the cervical spine were also generated. COMPARISON:  None. FINDINGS: CT HEAD FINDINGS Brain: No mass lesion, intraparenchymal hemorrhage or extra-axial collection. No evidence of acute  cortical infarct. Brain parenchyma and CSF-containing spaces are normal for age. Vascular: No hyperdense vessel or unexpected calcification. Skull: There is mild left parietal scalp soft tissue swelling. Sinuses/Orbits: No sinus fluid levels or advanced mucosal thickening. No mastoid effusion. Normal orbits. CT CERVICAL SPINE FINDINGS Alignment: No static subluxation. Facets are aligned. Occipital condyles are normally positioned. Skull base and vertebrae: No acute fracture. Soft tissues and spinal canal: No prevertebral fluid or swelling. No visible canal hematoma. Disc levels: No advanced spinal canal or neural foraminal stenosis. Upper chest: No pneumothorax, pulmonary nodule or pleural effusion. Other: Normal visualized paraspinal cervical soft tissues. IMPRESSION: 1. Mild left parietal scalp soft tissue swelling without underlying skull fracture or intracranial abnormality. 2. Findings of chronic microvascular ischemia. 3. No acute fracture or static subluxation of the cervical spine. Electronically Signed   By: Deatra Robinson M.D.   On: 09/12/2016 05:04   Ct Head Wo Contrast  Result Date: 08/27/2016 CLINICAL DATA:  Fall from toilet.  Dementia patient. EXAM: CT HEAD WITHOUT CONTRAST CT CERVICAL SPINE WITHOUT CONTRAST TECHNIQUE: Multidetector CT imaging of the head and cervical spine was performed following the standard protocol without intravenous contrast. Multiplanar CT image reconstructions of the cervical  spine were also generated. COMPARISON:  Head and cervical spine CT 18 days prior 08/09/2016 FINDINGS: CT HEAD FINDINGS Brain: Stable atrophy and chronic small vessel ischemia. Remote lacunar infarct in left basal ganglia, unchanged. No acute hemorrhage or evidence of acute ischemia. No subdural fluid collection. No hydrocephalus, mass effect or midline shift. Vascular: Atherosclerosis of skullbase vasculature without hyperdense vessel or abnormal calcification. Skull: No skull fracture. Sinuses/Orbits:  Stable fluid in left side of sphenoid sinus. Remaining included paranasal sinuses are clear. Mastoid air cells are well-aerated. Other: Diminished frontal scalp hematomas from prior exam. CT CERVICAL SPINE FINDINGS Alignment: Normal.  Patient's head is tilted in the scanner. Skull base and vertebrae: No acute fracture. Vertebral body heights are maintained. Skull base and dens are intact. Bony under mineralization. Soft tissues and spinal canal: No prevertebral fluid or swelling. No visible canal hematoma. Disc levels: Disc space narrowing and endplate spurring is most prominent at C5-C6 and C6-C7. Scattered facet arthropathy. Upper chest: No acute abnormality. Other: Carotid vascular calcifications. IMPRESSION: 1. Stable atrophy and chronic small vessel ischemia without acute intracranial abnormality. No skull fracture. 2. Stable degenerative change in the cervical spine without acute fracture or subluxation. Electronically Signed   By: Rubye Oaks M.D.   On: 08/27/2016 22:31   Ct Cervical Spine Wo Contrast  Result Date: 09/12/2016 CLINICAL DATA:  Found down EXAM: CT HEAD WITHOUT CONTRAST CT CERVICAL SPINE WITHOUT CONTRAST TECHNIQUE: Multidetector CT imaging of the head and cervical spine was performed following the standard protocol without intravenous contrast. Multiplanar CT image reconstructions of the cervical spine were also generated. COMPARISON:  None. FINDINGS: CT HEAD FINDINGS Brain: No mass lesion, intraparenchymal hemorrhage or extra-axial collection. No evidence of acute cortical infarct. Brain parenchyma and CSF-containing spaces are normal for age. Vascular: No hyperdense vessel or unexpected calcification. Skull: There is mild left parietal scalp soft tissue swelling. Sinuses/Orbits: No sinus fluid levels or advanced mucosal thickening. No mastoid effusion. Normal orbits. CT CERVICAL SPINE FINDINGS Alignment: No static subluxation. Facets are aligned. Occipital condyles are normally  positioned. Skull base and vertebrae: No acute fracture. Soft tissues and spinal canal: No prevertebral fluid or swelling. No visible canal hematoma. Disc levels: No advanced spinal canal or neural foraminal stenosis. Upper chest: No pneumothorax, pulmonary nodule or pleural effusion. Other: Normal visualized paraspinal cervical soft tissues. IMPRESSION: 1. Mild left parietal scalp soft tissue swelling without underlying skull fracture or intracranial abnormality. 2. Findings of chronic microvascular ischemia. 3. No acute fracture or static subluxation of the cervical spine. Electronically Signed   By: Deatra Robinson M.D.   On: 09/12/2016 05:04   Ct Cervical Spine Wo Contrast  Result Date: 08/27/2016 CLINICAL DATA:  Fall from toilet.  Dementia patient. EXAM: CT HEAD WITHOUT CONTRAST CT CERVICAL SPINE WITHOUT CONTRAST TECHNIQUE: Multidetector CT imaging of the head and cervical spine was performed following the standard protocol without intravenous contrast. Multiplanar CT image reconstructions of the cervical spine were also generated. COMPARISON:  Head and cervical spine CT 18 days prior 08/09/2016 FINDINGS: CT HEAD FINDINGS Brain: Stable atrophy and chronic small vessel ischemia. Remote lacunar infarct in left basal ganglia, unchanged. No acute hemorrhage or evidence of acute ischemia. No subdural fluid collection. No hydrocephalus, mass effect or midline shift. Vascular: Atherosclerosis of skullbase vasculature without hyperdense vessel or abnormal calcification. Skull: No skull fracture. Sinuses/Orbits: Stable fluid in left side of sphenoid sinus. Remaining included paranasal sinuses are clear. Mastoid air cells are well-aerated. Other: Diminished frontal scalp hematomas from prior exam. CT  CERVICAL SPINE FINDINGS Alignment: Normal.  Patient's head is tilted in the scanner. Skull base and vertebrae: No acute fracture. Vertebral body heights are maintained. Skull base and dens are intact. Bony under  mineralization. Soft tissues and spinal canal: No prevertebral fluid or swelling. No visible canal hematoma. Disc levels: Disc space narrowing and endplate spurring is most prominent at C5-C6 and C6-C7. Scattered facet arthropathy. Upper chest: No acute abnormality. Other: Carotid vascular calcifications. IMPRESSION: 1. Stable atrophy and chronic small vessel ischemia without acute intracranial abnormality. No skull fracture. 2. Stable degenerative change in the cervical spine without acute fracture or subluxation. Electronically Signed   By: Rubye Oaks M.D.   On: 08/27/2016 22:31   Ct Hip Left Wo Contrast  Result Date: 09/12/2016 CLINICAL DATA:  Status post fall, with left hip pain. Initial encounter. EXAM: CT OF THE LEFT HIP WITHOUT CONTRAST TECHNIQUE: Multidetector CT imaging of the left hip was performed according to the standard protocol. Multiplanar CT image reconstructions were also generated. COMPARISON:  Bilateral hip radiographs performed earlier today at 4:10 a.m. FINDINGS: Bones/Joint/Cartilage Cortical irregularity along the lateral aspect of the left femoral head appears new from prior studies in March and December. This raises suspicion for a poorly characterized subcapital fracture through the left femoral neck. Mild osteophyte formation is noted about the left hip joint. The cartilage is not well assessed. No significant hip joint effusion is seen. Ligaments Suboptimally assessed by CT. Muscles and Tendons The visualized musculature is grossly unremarkable in appearance. No focal tendon abnormalities are seen. Soft tissues Note is made of a prominent focal soft tissue hematoma overlying the left greater femoral trochanter, measuring at least 8 cm in size, with associated soft tissue injury tracking along the proximal left thigh. The bladder is mildly distended. Visualized small and large bowel loops are grossly unremarkable. IMPRESSION: 1. Cortical irregularity along the lateral aspect of  the left femoral head appears new from prior studies in March in December. This raises suspicion for a poorly characterized subcapital fracture through the left femoral neck. MRI could be considered as deemed clinically appropriate. 2. Mild osteophyte formation about the left hip joint. 3. Prominent focal soft tissue hematoma overlying the left greater femoral trochanter, measuring at least 8 cm in size, with associated soft tissue injury tracking along the proximal left thigh. Electronically Signed   By: Roanna Raider M.D.   On: 09/12/2016 06:46   Mr Hip Left Wo Contrast  Result Date: 09/12/2016 CLINICAL DATA:  Left hip pain due to a fall last night. Initial encounter. EXAM: MR OF THE LEFT HIP WITHOUT CONTRAST TECHNIQUE: Multiplanar, multisequence MR imaging was performed. No intravenous contrast was administered. COMPARISON:  CT and plain films left hip this same day. FINDINGS: Bones: The left hip is located. No fracture is identified. Right hip replacement is noted. Articular cartilage and labrum Articular cartilage:  Mildly degenerated. Labrum:  Degenerated without tear. Joint or bursal effusion Joint effusion:  None. Bursae:  Negative. Muscles and tendons Muscles and tendons:  Intact. Other findings Miscellaneous: A large hematoma in the deep subcutaneous tissues over the left hip measures 9 cm craniocaudal by 5 cm AP by 4.5 cm transverse. IMPRESSION: Negative for fracture or dislocation Large subcutaneous hematoma over the left hip. Electronically Signed   By: Drusilla Kanner M.D.   On: 09/12/2016 09:37   Dg Knee Complete 4 Views Left  Result Date: 09/12/2016 CLINICAL DATA:  Acute onset of left knee pain. Patient found on floor. Initial encounter. EXAM: LEFT  KNEE - COMPLETE 4+ VIEW COMPARISON:  Left knee radiographs performed 04/17/2016 FINDINGS: There is no evidence of fracture or dislocation. The joint spaces are preserved. No significant degenerative change is seen; the patellofemoral joint is  grossly unremarkable in appearance. A fabella is noted. Scattered vascular calcifications are seen. A small knee joint effusion is noted. There is calcification of the menisci. IMPRESSION: 1. No evidence of fracture or dislocation. 2. Small knee joint effusion noted. 3. Scattered vascular calcifications seen. 4. Calcification of the menisci. Electronically Signed   By: Roanna Raider M.D.   On: 09/12/2016 04:53   Dg Knee Complete 4 Views Right  Result Date: 09/12/2016 CLINICAL DATA:  Acute onset of right knee pain. Found on floor. Initial encounter. EXAM: RIGHT KNEE - COMPLETE 4+ VIEW COMPARISON:  Right knee radiographs performed 04/17/2016 FINDINGS: There is no evidence of fracture or dislocation. The joint spaces are preserved. There is calcification at the menisci. Scattered vascular calcifications are seen. No significant joint effusion is seen. The visualized soft tissues are otherwise unremarkable in appearance. IMPRESSION: 1. No evidence of fracture or dislocation. 2. Scattered vascular calcifications seen. 3. Calcification at the menisci. Electronically Signed   By: Roanna Raider M.D.   On: 09/12/2016 04:52   Dg Hips Bilat W Or Wo Pelvis 3-4 Views  Result Date: 09/12/2016 CLINICAL DATA:  Patient found on bathroom floor, with left hip pain. Initial encounter. EXAM: DG HIP (WITH OR WITHOUT PELVIS) 3-4V BILAT COMPARISON:  Bilateral hip radiographs performed 04/17/2016 FINDINGS: There is question of minimal cortical irregularity about the left femoral head. Fracture cannot be entirely excluded. The patient's right hip arthroplasty is grossly unremarkable, without evidence of loosening. The proximal femurs appear intact. Mild degenerative change is noted along the lower lumbar spine. The sacroiliac joints are unremarkable in appearance. The visualized bowel gas pattern is grossly unremarkable in appearance. IMPRESSION: 1. Question of minimal cortical irregularity about the left femoral head. Fracture  cannot be entirely excluded. MRI or CT could be considered for further evaluation, if the patient's symptoms persist. 2. Right hip arthroplasty is grossly unremarkable, without evidence of loosening. Electronically Signed   By: Roanna Raider M.D.   On: 09/12/2016 05:04    Microbiology: Recent Results (from the past 240 hour(s))  MRSA PCR Screening     Status: None   Collection Time: 09/17/16  6:20 AM  Result Value Ref Range Status   MRSA by PCR NEGATIVE NEGATIVE Final    Comment:        The GeneXpert MRSA Assay (FDA approved for NASAL specimens only), is one component of a comprehensive MRSA colonization surveillance program. It is not intended to diagnose MRSA infection nor to guide or monitor treatment for MRSA infections.      Labs: Basic Metabolic Panel:  Recent Labs Lab 09/12/16 0800 09/16/16 0018 09/16/16 0906 09/17/16 0453 09/18/16 0424  NA 142 143  --  140 139  K 4.4 3.9  --  3.4* 4.2  CL 106 106  --  108 105  CO2 29 27  --  26 26  GLUCOSE 106* 134*  --  111* 108*  BUN 19 20  --  11 12  CREATININE 0.75 0.74 0.82 0.73 0.80  CALCIUM 9.1 9.0  --  8.2* 8.4*   Liver Function Tests:  Recent Labs Lab 09/17/16 0453  AST 21  ALT 31  ALKPHOS 71  BILITOT 0.4  PROT 5.9*  ALBUMIN 3.0*   No results for input(s): LIPASE, AMYLASE in the  last 168 hours. No results for input(s): AMMONIA in the last 168 hours. CBC:  Recent Labs Lab 09/12/16 0800 09/16/16 0018 09/16/16 0906 09/17/16 0453 09/18/16 0424  WBC 11.5* 17.3* 12.9* 10.4 8.8  NEUTROABS 9.0* 14.7*  --   --   --   HGB 12.0 10.0* 9.0* 8.7* 8.8*  HCT 35.6* 29.9* 27.3* 25.8* 25.5*  MCV 88.1 88.7 86.9 89.6 85.9  PLT 223 224 207 199 217   Cardiac Enzymes: No results for input(s): CKTOTAL, CKMB, CKMBINDEX, TROPONINI in the last 168 hours. BNP: BNP (last 3 results) No results for input(s): BNP in the last 8760 hours.  ProBNP (last 3 results) No results for input(s): PROBNP in the last 8760  hours.  CBG: No results for input(s): GLUCAP in the last 168 hours.     SignedEdsel Petrin  [facility] 09/18/2016, 11:25 AM

## 2016-09-22 DIAGNOSIS — R296 Repeated falls: Secondary | ICD-10-CM | POA: Diagnosis not present

## 2016-09-22 DIAGNOSIS — G0430 Acute necrotizing hemorrhagic encephalopathy, unspecified: Secondary | ICD-10-CM | POA: Diagnosis not present

## 2016-09-22 DIAGNOSIS — D649 Anemia, unspecified: Secondary | ICD-10-CM | POA: Diagnosis not present

## 2016-09-22 DIAGNOSIS — E876 Hypokalemia: Secondary | ICD-10-CM | POA: Diagnosis not present

## 2016-09-22 DIAGNOSIS — F0281 Dementia in other diseases classified elsewhere with behavioral disturbance: Secondary | ICD-10-CM | POA: Diagnosis not present

## 2016-09-22 DIAGNOSIS — G309 Alzheimer's disease, unspecified: Secondary | ICD-10-CM | POA: Diagnosis not present

## 2016-09-22 DIAGNOSIS — J189 Pneumonia, unspecified organism: Secondary | ICD-10-CM | POA: Diagnosis not present

## 2016-09-23 DIAGNOSIS — G0430 Acute necrotizing hemorrhagic encephalopathy, unspecified: Secondary | ICD-10-CM | POA: Diagnosis not present

## 2016-09-24 ENCOUNTER — Emergency Department (HOSPITAL_COMMUNITY)
Admission: EM | Admit: 2016-09-24 | Discharge: 2016-09-24 | Disposition: A | Discharge status: Home / Self Care | Payer: Medicare HMO | Attending: Emergency Medicine | Admitting: Emergency Medicine

## 2016-09-24 ENCOUNTER — Emergency Department (HOSPITAL_COMMUNITY): Payer: Medicare HMO

## 2016-09-24 DIAGNOSIS — I10 Essential (primary) hypertension: Secondary | ICD-10-CM | POA: Diagnosis not present

## 2016-09-24 DIAGNOSIS — J45909 Unspecified asthma, uncomplicated: Secondary | ICD-10-CM | POA: Diagnosis not present

## 2016-09-24 DIAGNOSIS — S199XXA Unspecified injury of neck, initial encounter: Secondary | ICD-10-CM | POA: Diagnosis not present

## 2016-09-24 DIAGNOSIS — Y939 Activity, unspecified: Secondary | ICD-10-CM | POA: Diagnosis not present

## 2016-09-24 DIAGNOSIS — S0993XA Unspecified injury of face, initial encounter: Secondary | ICD-10-CM | POA: Diagnosis not present

## 2016-09-24 DIAGNOSIS — S0990XA Unspecified injury of head, initial encounter: Secondary | ICD-10-CM

## 2016-09-24 DIAGNOSIS — Y999 Unspecified external cause status: Secondary | ICD-10-CM | POA: Diagnosis not present

## 2016-09-24 DIAGNOSIS — W06XXXA Fall from bed, initial encounter: Secondary | ICD-10-CM | POA: Diagnosis not present

## 2016-09-24 DIAGNOSIS — W19XXXA Unspecified fall, initial encounter: Secondary | ICD-10-CM

## 2016-09-24 DIAGNOSIS — Y929 Unspecified place or not applicable: Secondary | ICD-10-CM | POA: Diagnosis not present

## 2016-09-24 DIAGNOSIS — Z87891 Personal history of nicotine dependence: Secondary | ICD-10-CM | POA: Insufficient documentation

## 2016-09-24 DIAGNOSIS — Z96641 Presence of right artificial hip joint: Secondary | ICD-10-CM | POA: Diagnosis not present

## 2016-09-24 DIAGNOSIS — S0450XA Injury of facial nerve, unspecified side, initial encounter: Secondary | ICD-10-CM | POA: Diagnosis not present

## 2016-09-24 DIAGNOSIS — G0430 Acute necrotizing hemorrhagic encephalopathy, unspecified: Secondary | ICD-10-CM | POA: Diagnosis not present

## 2016-09-24 DIAGNOSIS — S098XXA Other specified injuries of head, initial encounter: Secondary | ICD-10-CM | POA: Diagnosis not present

## 2016-09-24 DIAGNOSIS — E039 Hypothyroidism, unspecified: Secondary | ICD-10-CM | POA: Diagnosis not present

## 2016-09-24 NOTE — ED Triage Notes (Signed)
Pt comes to ed via ems fall  [facility]. She had a fall  from her bed to her safety mattress small superficial abrasion to left side of face.  Hx of alzheimer. No other reported new injuries.old bruise from past fall left hip not related today's fall. V/s on arrival bp 154/66, pulse 80, rr22, DNR. Alert to person and event at baseline/

## 2016-09-24 NOTE — ED Provider Notes (Signed)
[facility]-EMERGENCY DEPT Provider Note   CSN: [id number] Arrival date & time: 09/24/16  0120   By signing my name below, I, [name], attest that this documentation has been prepared under the direction and in the presence of [employer], PA-C. Electronically Signed: Thelma Horton, Scribe. 09/24/16. 1:39 AM.  History   Chief Complaint Chief Complaint  Patient presents with  . Fall    12:30pm tonight   . Head Injury    facial superfrical abrasion left side face   LEVEL 5 CAVEAT DUE TO dementia The history is provided by medical records. No language interpreter was used.   HPI Comments: [name] is a 83 y.o. female with a history of dementia, who presents to the Emergency Department after falling from her bed to her safety mattress. She was brought to the ED from [facility] by EMS. Per triage note pt has a small abrasion to the left face. Unable to fully obtain HPI/ROS at this time due to patient's mental status.   Past Medical History:  Diagnosis Date  . Acute bronchitis 04/10/2013  . Anemia   . Angina   . Anxiety   . Arthritis   . Arthritis 10/02/2012  . Asthma   . Chicken pox   . Chronic kidney disease   . Colon polyps   . Dehydration 08/31/2013  . Dementia   . Depression   . Dysrhythmia   . Headache(784.0)   . Heart murmur   . Hypertension   . Hypothyroidism   . Left leg pain 10/02/2012  . Pneumonia   . Recurrent upper respiratory infection (URI)   . Shortness of breath   . UTI (urinary tract infection) 12/30/2012  . Valvular heart disease 09/04/2012    Patient Active Problem List   Diagnosis Date Noted  . Tibial fracture 09/16/2016  . Left fibular fracture 09/16/2016  . Leukocytosis 09/16/2016  . Pressure ulcer 09/20/2015  . Displaced fracture of right femoral neck (HCC) 09/19/2015  . Altered mental status   . Confusion 06/26/2015  . Fall   . Aspiration pneumonia (HCC) 01/10/2015  . Acute encephalopathy 01/08/2015  . UTI (urinary tract infection)  01/08/2015  . 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 Recurrent falls 11/27/2014  . FTT (failure to thrive) in adult 10/26/2014  . Dehydration 10/26/2014  . Abnormal urinalysis 10/26/2014  . Normocytic anemia 10/26/2014  . Lump of skin of left upper extremity 09/07/2014  . Rash and nonspecific skin eruption 05/08/2014  . Syncope and collapse 11/30/2013  . Low back pain, episodic 11/20/2013  . Chronic kidney disease   . Insomnia 08/31/2013  . Arthritis 10/02/2012  . Valvular heart disease 09/04/2012  . Restless leg syndrome 06/06/2012  . Trochanteric bursitis 08/02/2011  . Dementia 08/02/2011  . Leg pain 03/28/2011  . Hypertension 09/22/2010  . Hypothyroidism 09/22/2010    Past Surgical History:  Procedure Laterality Date  . ABDOMINAL HYSTERECTOMY    . APPENDECTOMY    . BACK SURGERY    . CHOLECYSTECTOMY    . COLONOSCOPY W/ POLYPECTOMY    . EYE SURGERY     cataract removed and eye lids lifted  . feet surgery    . FRACTURE SURGERY     bilateral arms  . HAND SURGERY    . HIP ARTHROPLASTY Right 09/20/2015   Procedure: ARTHROPLASTY RIGHT  BIPOLAR ANTERIOR HIP (HEMIARTHROPLASTY);  Surgeon: Samson Frederic, MD;  Location: [facility];  Service: Orthopedics;  Laterality: Right;  . kidney stones    .  SHOULDER ARTHROSCOPY    . TONSILLECTOMY    . TONSILLECTOMY    . TUBAL LIGATION      OB History    No data available       Home Medications    Prior to Admission medications   Medication Sig Start Date End Date Taking? Authorizing Provider  acetaminophen (TYLENOL) 500 MG tablet Take 500 mg by mouth every 4 (four) hours as needed for mild pain, moderate pain, fever or headache.     Historical Provider, MD  alum & mag hydroxide-simeth (MINTOX) 200-200-20 MG/5ML suspension Take 30 mLs by mouth every 6 (six) hours as needed for indigestion or heartburn.     Historical Provider, MD  aspirin 81 MG chewable tablet Chew 81 mg by mouth daily.    Historical Provider, MD    azithromycin (ZITHROMAX) 250 MG tablet Take 1 tablet (250 mg total) by mouth daily. 09/18/16   Maryann Mikhail, DO  cefUROXime (CEFTIN) 250 MG tablet Take 1 tablet (250 mg total) by mouth 2 (two) times daily with a meal. 09/18/16   Maryann Mikhail, DO  clonazePAM (KLONOPIN) 0.5 MG tablet Take 1 tablet (0.5 mg total) by mouth 2 (two) times daily. Patient taking differently: Take 0.25-0.5 mg by mouth See admin instructions. Pt takes one-half tablet every morning and one tablet at 1400 and 2000. 09/23/15   Elease Etienne, MD  divalproex (DEPAKOTE SPRINKLE) 125 MG capsule Take 125 mg by mouth 3 (three) times daily.     Historical Provider, MD  escitalopram (LEXAPRO) 5 MG tablet Take 5 mg by mouth daily.    Historical Provider, MD  guaifenesin (ROBITUSSIN) 100 MG/5ML syrup Take 200 mg by mouth every 6 (six) hours as needed for cough.     Historical Provider, MD  levothyroxine (SYNTHROID, LEVOTHROID) 50 MCG tablet Take 50 mcg by mouth daily before breakfast.     Historical Provider, MD  loperamide (IMODIUM) 2 MG capsule Take 2 mg by mouth every 3 (three) hours as needed for diarrhea or loose stools.     Historical Provider, MD  magnesium hydroxide (MILK OF MAGNESIA) 400 MG/5ML suspension Take 30 mLs by mouth at bedtime as needed for mild constipation.    Historical Provider, MD  Multiple Vitamins-Minerals (MULTIVITAMIN WITH MINERALS) tablet Take 1 tablet by mouth daily.    Historical Provider, MD  Neomycin-Bacitracin-Polymyxin (TRIPLE ANTIBIOTIC) [phone] OINT Apply 1 application topically 3 (three) times daily as needed (for skin tears/abrasions).     Historical Provider, MD  Nutritional Supplements (NUTRITIONAL DRINK) LIQD Take 1 Bottle by mouth 3 (three) times daily with meals. Mighty Shakes    Historical Provider, MD  rivastigmine (EXELON) 1.5 MG capsule Take 1.5 mg by mouth 2 (two) times daily.    Historical Provider, MD  senna (SENOKOT) 8.6 MG TABS tablet Take 2 tablets by mouth 2 (two) times  daily.    Historical Provider, MD  traMADol (ULTRAM) 50 MG tablet Take 1 tablet (50 mg total) by mouth every 6 (six) hours as needed for severe pain. 09/18/16   Maryann Mikhail, DO  traZODone (DESYREL) 50 MG tablet Take 50 mg by mouth at bedtime.     Historical Provider, MD    Family History Family History  Problem Relation Age of Onset  . Heart disease Mother   . Heart disease Father   . Heart disease Other   . Birth defects Other     Social History Social History  Substance Use Topics  . Smoking status: Former Smoker  Packs/day: 0.20    Years: 1.00    Types: Cigarettes    Quit date: 06/02/1941  . Smokeless tobacco: Never Used  . Alcohol use No     Allergies   Morphine and related; Codeine; and Hydrocodone   Review of Systems Review of Systems  Unable to perform ROS: Dementia   Physical Exam Updated Vital Signs BP (!) 149/51 (BP Location: Left Arm)   Pulse (!) 57   Temp 98.2 F (36.8 C) (Oral)   Resp 15   SpO2 95%   Physical Exam  Constitutional: She appears well-developed and well-nourished. No distress.  HENT:  Head: Normocephalic and atraumatic.  Eyes: Pupils are equal, round, and reactive to light.  Cardiovascular: Normal rate and regular rhythm.   Pulmonary/Chest: Effort normal and breath sounds normal.  Musculoskeletal:       Right hip: Normal.       Left hip: Normal.  Neurological: She is alert.  Skin: Skin is warm and dry.  Psychiatric: She has a normal mood and affect.  Nursing note and vitals reviewed.    ED Treatments / Results  DIAGNOSTIC STUDIES: Oxygen Saturation is 95% on RA, normal by my interpretation.     Labs (all labs ordered are listed, but only abnormal results are displayed) Labs Reviewed - No data to display  EKG  EKG Interpretation None       Radiology No results found.  Procedures Procedures (including critical care time)  Medications Ordered in ED Medications - No data to display   Initial Impression /  Assessment and Plan / ED Course  I have reviewed the triage vital signs and the nursing notes.  Pertinent labs & imaging results that were available during my care of the patient were reviewed by me and considered in my medical decision making (see chart for details).     Patient will be discharged back to the nursing facility after negative CT imaging.  Patient will be advised follow-up with primary care Dr. told to return here as needed  Final Clinical Impressions(s) / ED Diagnoses   Final diagnoses:  None    New Prescriptions New Prescriptions   No medications on file  I personally performed the services described in this documentation, which was scribed in my presence. The recorded information has been reviewed and is accurate.    Charlestine Night, PA-C 09/24/16 8119    Zadie Rhine, MD 09/26/16 [phone]

## 2016-09-24 NOTE — ED Notes (Signed)
Bed: ZO10 Expected date:  Expected time:  Means of arrival:  Comments: EMS: Fall out of bed, face pain

## 2016-09-24 NOTE — Discharge Instructions (Signed)
Return here as needed. Follow up with her doctor for a recheck

## 2016-09-25 DIAGNOSIS — G0430 Acute necrotizing hemorrhagic encephalopathy, unspecified: Secondary | ICD-10-CM | POA: Diagnosis not present

## 2016-09-26 DIAGNOSIS — G0430 Acute necrotizing hemorrhagic encephalopathy, unspecified: Secondary | ICD-10-CM | POA: Diagnosis not present

## 2016-09-27 DIAGNOSIS — G0430 Acute necrotizing hemorrhagic encephalopathy, unspecified: Secondary | ICD-10-CM | POA: Diagnosis not present

## 2016-09-28 DIAGNOSIS — G0430 Acute necrotizing hemorrhagic encephalopathy, unspecified: Secondary | ICD-10-CM | POA: Diagnosis not present

## 2016-09-29 DIAGNOSIS — G0430 Acute necrotizing hemorrhagic encephalopathy, unspecified: Secondary | ICD-10-CM | POA: Diagnosis not present

## 2016-09-30 DIAGNOSIS — G0430 Acute necrotizing hemorrhagic encephalopathy, unspecified: Secondary | ICD-10-CM | POA: Diagnosis not present

## 2016-10-01 DIAGNOSIS — G0430 Acute necrotizing hemorrhagic encephalopathy, unspecified: Secondary | ICD-10-CM | POA: Diagnosis not present

## 2016-10-01 DIAGNOSIS — Z5181 Encounter for therapeutic drug level monitoring: Secondary | ICD-10-CM | POA: Diagnosis not present

## 2016-10-01 DIAGNOSIS — D649 Anemia, unspecified: Secondary | ICD-10-CM | POA: Diagnosis not present

## 2016-10-01 DIAGNOSIS — N390 Urinary tract infection, site not specified: Secondary | ICD-10-CM | POA: Diagnosis not present

## 2016-10-02 DIAGNOSIS — G0430 Acute necrotizing hemorrhagic encephalopathy, unspecified: Secondary | ICD-10-CM | POA: Diagnosis not present

## 2016-10-03 DIAGNOSIS — G0430 Acute necrotizing hemorrhagic encephalopathy, unspecified: Secondary | ICD-10-CM | POA: Diagnosis not present

## 2016-10-04 ENCOUNTER — Emergency Department (HOSPITAL_COMMUNITY): Payer: Medicare HMO

## 2016-10-04 ENCOUNTER — Encounter (HOSPITAL_COMMUNITY): Payer: Self-pay | Admitting: Emergency Medicine

## 2016-10-04 ENCOUNTER — Emergency Department (HOSPITAL_COMMUNITY)
Admission: EM | Admit: 2016-10-04 | Discharge: 2016-10-04 | Disposition: A | Discharge status: Home / Self Care | Payer: Medicare HMO | Attending: Emergency Medicine | Admitting: Emergency Medicine

## 2016-10-04 DIAGNOSIS — R51 Headache: Secondary | ICD-10-CM | POA: Insufficient documentation

## 2016-10-04 DIAGNOSIS — J45909 Unspecified asthma, uncomplicated: Secondary | ICD-10-CM | POA: Insufficient documentation

## 2016-10-04 DIAGNOSIS — R4182 Altered mental status, unspecified: Secondary | ICD-10-CM | POA: Diagnosis not present

## 2016-10-04 DIAGNOSIS — R6889 Other general symptoms and signs: Secondary | ICD-10-CM | POA: Diagnosis not present

## 2016-10-04 DIAGNOSIS — M79606 Pain in leg, unspecified: Secondary | ICD-10-CM | POA: Diagnosis not present

## 2016-10-04 DIAGNOSIS — R404 Transient alteration of awareness: Secondary | ICD-10-CM | POA: Diagnosis not present

## 2016-10-04 DIAGNOSIS — Z96641 Presence of right artificial hip joint: Secondary | ICD-10-CM | POA: Insufficient documentation

## 2016-10-04 DIAGNOSIS — R4020 Unspecified coma: Secondary | ICD-10-CM | POA: Diagnosis not present

## 2016-10-04 DIAGNOSIS — Y929 Unspecified place or not applicable: Secondary | ICD-10-CM | POA: Diagnosis not present

## 2016-10-04 DIAGNOSIS — S0990XA Unspecified injury of head, initial encounter: Secondary | ICD-10-CM | POA: Diagnosis not present

## 2016-10-04 DIAGNOSIS — G0430 Acute necrotizing hemorrhagic encephalopathy, unspecified: Secondary | ICD-10-CM | POA: Diagnosis not present

## 2016-10-04 DIAGNOSIS — S3993XA Unspecified injury of pelvis, initial encounter: Secondary | ICD-10-CM | POA: Diagnosis not present

## 2016-10-04 DIAGNOSIS — R55 Syncope and collapse: Secondary | ICD-10-CM | POA: Diagnosis not present

## 2016-10-04 DIAGNOSIS — W1830XA Fall on same level, unspecified, initial encounter: Secondary | ICD-10-CM | POA: Diagnosis not present

## 2016-10-04 DIAGNOSIS — Y999 Unspecified external cause status: Secondary | ICD-10-CM | POA: Diagnosis not present

## 2016-10-04 DIAGNOSIS — S7012XA Contusion of left thigh, initial encounter: Secondary | ICD-10-CM | POA: Diagnosis not present

## 2016-10-04 DIAGNOSIS — Y939 Activity, unspecified: Secondary | ICD-10-CM | POA: Diagnosis not present

## 2016-10-04 DIAGNOSIS — E039 Hypothyroidism, unspecified: Secondary | ICD-10-CM | POA: Insufficient documentation

## 2016-10-04 DIAGNOSIS — S199XXA Unspecified injury of neck, initial encounter: Secondary | ICD-10-CM | POA: Diagnosis not present

## 2016-10-04 DIAGNOSIS — R402441 Other coma, without documented Glasgow coma scale score, or with partial score reported, in the field [EMT or ambulance]: Secondary | ICD-10-CM | POA: Diagnosis not present

## 2016-10-04 DIAGNOSIS — M79605 Pain in left leg: Secondary | ICD-10-CM | POA: Insufficient documentation

## 2016-10-04 LAB — CBC WITH DIFFERENTIAL/PLATELET
BASOS ABS: 0 10*3/uL (ref 0.0–0.1)
Basophils Relative: 0 %
EOS PCT: 1 %
Eosinophils Absolute: 0.1 10*3/uL (ref 0.0–0.7)
HCT: 33.8 % — ABNORMAL LOW (ref 36.0–46.0)
HEMOGLOBIN: 11.1 g/dL — AB (ref 12.0–15.0)
LYMPHS PCT: 21 %
Lymphs Abs: 1.7 10*3/uL (ref 0.7–4.0)
MCH: 29.3 pg (ref 26.0–34.0)
MCHC: 32.8 g/dL (ref 30.0–36.0)
MCV: 89.2 fL (ref 78.0–100.0)
Monocytes Absolute: 0.4 10*3/uL (ref 0.1–1.0)
Monocytes Relative: 5 %
NEUTROS ABS: 5.8 10*3/uL (ref 1.7–7.7)
NEUTROS PCT: 73 %
Platelets: 236 10*3/uL (ref 150–400)
RBC: 3.79 MIL/uL — AB (ref 3.87–5.11)
RDW: 13.6 % (ref 11.5–15.5)
WBC: 7.9 10*3/uL (ref 4.0–10.5)

## 2016-10-04 LAB — I-STAT CHEM 8, ED
BUN: 19 mg/dL (ref 6–20)
Calcium, Ion: 1.06 mmol/L — ABNORMAL LOW (ref 1.15–1.40)
Chloride: 108 mmol/L (ref 101–111)
Creatinine, Ser: 0.6 mg/dL (ref 0.44–1.00)
Glucose, Bld: 85 mg/dL (ref 65–99)
HEMATOCRIT: 34 % — AB (ref 36.0–46.0)
HEMOGLOBIN: 11.6 g/dL — AB (ref 12.0–15.0)
Potassium: 4.5 mmol/L (ref 3.5–5.1)
SODIUM: 142 mmol/L (ref 135–145)
TCO2: 26 mmol/L (ref 0–100)

## 2016-10-04 LAB — COMPREHENSIVE METABOLIC PANEL
ALBUMIN: 3.5 g/dL (ref 3.5–5.0)
ALT: 19 U/L (ref 14–54)
ANION GAP: 9 (ref 5–15)
AST: 24 U/L (ref 15–41)
Alkaline Phosphatase: 80 U/L (ref 38–126)
BUN: 15 mg/dL (ref 6–20)
CHLORIDE: 106 mmol/L (ref 101–111)
CO2: 26 mmol/L (ref 22–32)
CREATININE: 0.72 mg/dL (ref 0.44–1.00)
Calcium: 9 mg/dL (ref 8.9–10.3)
GFR calc non Af Amer: >60 mL/min (ref >60)
Glucose, Bld: 86 mg/dL (ref 65–99)
Potassium: 4.4 mmol/L (ref 3.5–5.1)
Sodium: 141 mmol/L (ref 135–145)
Total Bilirubin: 0.6 mg/dL (ref 0.3–1.2)
Total Protein: 6.6 g/dL (ref 6.5–8.1)

## 2016-10-04 LAB — URINALYSIS, ROUTINE W REFLEX MICROSCOPIC
Bilirubin Urine: NEGATIVE
GLUCOSE, UA: NEGATIVE mg/dL
HGB URINE DIPSTICK: NEGATIVE
Ketones, ur: NEGATIVE mg/dL
Leukocytes, UA: NEGATIVE
Nitrite: NEGATIVE
PH: 5 (ref 5.0–8.0)
Protein, ur: NEGATIVE mg/dL
SPECIFIC GRAVITY, URINE: 1.021 (ref 1.005–1.030)

## 2016-10-04 LAB — AMMONIA: AMMONIA: 37 umol/L — AB (ref 9–35)

## 2016-10-04 LAB — RAPID URINE DRUG SCREEN, HOSP PERFORMED
Amphetamines: NOT DETECTED
BARBITURATES: NOT DETECTED
BENZODIAZEPINES: NOT DETECTED
COCAINE: NOT DETECTED
Opiates: NOT DETECTED
TETRAHYDROCANNABINOL: NOT DETECTED

## 2016-10-04 LAB — CBG MONITORING, ED: Glucose-Capillary: 74 mg/dL (ref 65–99)

## 2016-10-04 LAB — ETHANOL: Alcohol, Ethyl (B): <5 mg/dL (ref <5)

## 2016-10-04 LAB — I-STAT CG4 LACTIC ACID, ED: Lactic Acid, Venous: 1.8 mmol/L (ref 0.5–1.9)

## 2016-10-04 LAB — APTT: aPTT: 24 seconds (ref 24–36)

## 2016-10-04 MED ORDER — ONDANSETRON HCL 4 MG/2ML IJ SOLN
4.0000 mg | Freq: Once | INTRAMUSCULAR | Status: AC
  Administered 2016-10-04: 4 mg via INTRAVENOUS
  Filled 2016-10-04: qty 2

## 2016-10-04 MED ORDER — ACETAMINOPHEN 325 MG PO TABS
650.0000 mg | ORAL_TABLET | Freq: Once | ORAL | Status: DC
  Filled 2016-10-04: qty 2

## 2016-10-04 MED ORDER — NALOXONE HCL 0.4 MG/ML IJ SOLN
0.4000 mg | Freq: Once | INTRAMUSCULAR | Status: AC
  Administered 2016-10-04: 0.4 mg via INTRAVENOUS
  Filled 2016-10-04: qty 1

## 2016-10-04 MED ORDER — MORPHINE SULFATE (PF) 4 MG/ML IV SOLN
2.0000 mg | Freq: Once | INTRAVENOUS | Status: DC
  Filled 2016-10-04: qty 1

## 2016-10-04 MED ORDER — TRAMADOL HCL 50 MG PO TABS
50.0000 mg | ORAL_TABLET | Freq: Once | ORAL | Status: DC
  Filled 2016-10-04: qty 1

## 2016-10-04 NOTE — Progress Notes (Signed)
Came to room to obtain ABG, per RN- pt is becoming more responsive now.  MD states we can hold off on obtaining ABG for now.

## 2016-10-04 NOTE — ED Triage Notes (Signed)
Per EMS- [facility] w hx of falls and dementia. Pt last seen normal last night at midnight. Pt found in her bed this morning unresponsive and non verbal. CBG 120, BP 120s systolic. HR 50-60s. DNR at bedside. Pt has different stage bruising throughout body. 18G PIV to R wrist placed by EMS.

## 2016-10-04 NOTE — ED Notes (Signed)
Patient CBG was 74.

## 2016-10-04 NOTE — Discharge Instructions (Signed)
We were unable to find a cause of the patient's altered mental state. I had a long discussion with her son who feels that she does not need any inpatient work up at this time. We will be happy to reevaluate her for any changes. She appears to be back to baseline at this time.

## 2016-10-04 NOTE — ED Notes (Signed)
Pt is awake and talking stating "I hurt everywhere, my back hurts." PA informed

## 2016-10-04 NOTE — ED Provider Notes (Signed)
[facility]-EMERGENCY DEPT Provider Note   CSN: [id number] Arrival date & time: 10/04/16  4098     History   Chief Complaint Chief Complaint  Patient presents with  . Altered Mental Status    Level VI Caveat due to AMS. HPI [name] is a 83 y.o. female who presents via EMS from [facility] skilled nursing facility for altered mental status. Her last seen normal was at 8 PM prior to going to bed. She has a history of multiple falls. Patient is DO NOT RESUSCITATE. Nursing staff found the patient unresponsive this morning. She is breathing on her own and has pulses. No recent illnesses.  HPI  Past Medical History:  Diagnosis Date  . Acute bronchitis 04/10/2013  . Anemia   . Angina   . Anxiety   . Arthritis   . Arthritis 10/02/2012  . Asthma   . Chicken pox   . Chronic kidney disease   . Colon polyps   . Dehydration 08/31/2013  . Dementia   . Depression   . Dysrhythmia   . Headache(784.0)   . Heart murmur   . Hypertension   . Hypothyroidism   . Left leg pain 10/02/2012  . Pneumonia   . Recurrent upper respiratory infection (URI)   . Shortness of breath   . UTI (urinary tract infection) 12/30/2012  . Valvular heart disease 09/04/2012    Patient Active Problem List   Diagnosis Date Noted  . Tibial fracture 09/16/2016  . Left fibular fracture 09/16/2016  . Leukocytosis 09/16/2016  . Pressure ulcer 09/20/2015  . Displaced fracture of right femoral neck (HCC) 09/19/2015  . Altered mental status   . Confusion 06/26/2015  . Fall   . Aspiration pneumonia (HCC) 01/10/2015  . Acute encephalopathy 01/08/2015  . UTI (urinary tract infection) 01/08/2015  . 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 Recurrent falls 11/27/2014  . FTT (failure to thrive) in adult 10/26/2014  . Dehydration 10/26/2014  . Abnormal urinalysis 10/26/2014  . Normocytic anemia 10/26/2014  . Lump of skin of left upper extremity 09/07/2014  . Rash and nonspecific skin eruption 05/08/2014   . Syncope and collapse 11/30/2013  . Low back pain, episodic 11/20/2013  . Chronic kidney disease   . Insomnia 08/31/2013  . Arthritis 10/02/2012  . Valvular heart disease 09/04/2012  . Restless leg syndrome 06/06/2012  . Trochanteric bursitis 08/02/2011  . Dementia 08/02/2011  . Leg pain 03/28/2011  . Hypertension 09/22/2010  . Hypothyroidism 09/22/2010    Past Surgical History:  Procedure Laterality Date  . ABDOMINAL HYSTERECTOMY    . APPENDECTOMY    . BACK SURGERY    . CHOLECYSTECTOMY    . COLONOSCOPY W/ POLYPECTOMY    . EYE SURGERY     cataract removed and eye lids lifted  . feet surgery    . FRACTURE SURGERY     bilateral arms  . HAND SURGERY    . HIP ARTHROPLASTY Right 09/20/2015   Procedure: ARTHROPLASTY RIGHT  BIPOLAR ANTERIOR HIP (HEMIARTHROPLASTY);  Surgeon: Samson Frederic, MD;  Location: [facility] OR[facility];  Service: Orthopedics;  Laterality: Right;  . kidney stones    . SHOULDER ARTHROSCOPY    . TONSILLECTOMY    . TONSILLECTOMY    . TUBAL LIGATION      OB History    No data available       Home Medications    Prior to Admission medications   Medication Sig Start Date End Date Taking? Authorizing Provider  acetaminophen (  TYLENOL) 500 MG tablet Take 500 mg by mouth every 4 (four) hours as needed for mild pain, moderate pain, fever or headache.     [provider]  alum & mag hydroxide-simeth (MINTOX) 200-200-20 MG/5ML suspension Take 30 mLs by mouth every 6 (six) hours as needed for indigestion or heartburn.     [provider]  aspirin 81 MG chewable tablet Chew 81 mg by mouth daily.    [provider]  azithromycin (ZITHROMAX) 250 MG tablet Take 1 tablet (250 mg total) by mouth daily. 09/18/16   Edsel Petrin, DO  cefUROXime (CEFTIN) 250 MG tablet Take 1 tablet (250 mg total) by mouth 2 (two) times daily with a meal. 09/18/16   Mikhail, Nita Sells, DO  clonazePAM (KLONOPIN) 0.5 MG tablet Take 1 tablet (0.5 mg total) by mouth 2 (two) times  daily. Patient taking differently: Take 0.25-0.5 mg by mouth See admin instructions. Pt takes one-half tablet every morning and one tablet at 1400 and 2000. 09/23/15   Hongalgi, Maximino Greenland, MD  divalproex (DEPAKOTE SPRINKLE) 125 MG capsule Take 125 mg by mouth 3 (three) times daily.     [provider]  escitalopram (LEXAPRO) 5 MG tablet Take 5 mg by mouth daily.    [provider]  guaifenesin (ROBITUSSIN) 100 MG/5ML syrup Take 200 mg by mouth every 6 (six) hours as needed for cough.     [provider]  levothyroxine (SYNTHROID, LEVOTHROID) 50 MCG tablet Take 50 mcg by mouth daily before breakfast.     [provider]  loperamide (IMODIUM) 2 MG capsule Take 2 mg by mouth every 3 (three) hours as needed for diarrhea or loose stools.     [provider]  magnesium hydroxide (MILK OF MAGNESIA) 400 MG/5ML suspension Take 30 mLs by mouth at bedtime as needed for mild constipation.    [provider]  Multiple Vitamins-Minerals (MULTIVITAMIN WITH MINERALS) tablet Take 1 tablet by mouth daily.    [provider]  Neomycin-Bacitracin-Polymyxin (TRIPLE ANTIBIOTIC) [phone] OINT Apply 1 application topically 3 (three) times daily as needed (for skin tears/abrasions).     [provider]  Nutritional Supplements (NUTRITIONAL DRINK) LIQD Take 1 Bottle by mouth 3 (three) times daily with meals. Mighty Shakes    [provider]  rivastigmine (EXELON) 1.5 MG capsule Take 1.5 mg by mouth 2 (two) times daily.    [provider]  senna (SENOKOT) 8.6 MG TABS tablet Take 2 tablets by mouth 2 (two) times daily.    [provider]  traMADol (ULTRAM) 50 MG tablet Take 1 tablet (50 mg total) by mouth every 6 (six) hours as needed for severe pain. 09/18/16   Edsel Petrin, DO  traZODone (DESYREL) 50 MG tablet Take 50 mg by mouth at bedtime.     [provider]    Family History Family History  Problem Relation  Age of Onset  . Heart disease Mother   . Heart disease Father   . Heart disease Other   . Birth defects Other     Social History Social History  Substance Use Topics  . Smoking status: Former Smoker    Packs/day: 0.20    Years: 1.00    Types: Cigarettes    Quit date: 06/02/1941  . Smokeless tobacco: Never Used  . Alcohol use No     Allergies   Morphine and related; Codeine; and Hydrocodone   Review of Systems Review of Systems  Unable to perform ROS: Mental status change  Physical Exam Updated Vital Signs BP (!) 171/68   Pulse 60   Temp (!) 96.2 F (35.7 C)   Resp 18   SpO2 98%   Physical Exam  Constitutional: She appears well-developed and well-nourished.  HENT:  Head: Normocephalic.  Bruising noted to the left lower face  Eyes: Pupils are equal, round, and reactive to light.  Neck: Normal range of motion. Neck supple. No JVD present.  Cardiovascular: Normal rate, regular rhythm and intact distal pulses.   Pulmonary/Chest: Effort normal and breath sounds normal. She has no wheezes. She has no rales.  Abdominal: Soft. Bowel sounds are normal. She exhibits no distension. There is no guarding.  Neurological: She is unresponsive. GCS eye subscore is 1. GCS verbal subscore is 1. GCS motor subscore is 4.  Skin: Ecchymosis and petechiae noted.  Patient with multiple areas of ecchymosis and petechial rash vs. Multiple cherry angiomata  Nursing note and vitals reviewed.    ED Treatments / Results  Labs (all labs ordered are listed, but only abnormal results are displayed) Labs Reviewed  CBC WITH DIFFERENTIAL/PLATELET - Abnormal; Notable for the following:       Result Value   RBC 3.79 (*)    Hemoglobin 11.1 (*)    HCT 33.8 (*)    All other components within normal limits  I-STAT CHEM 8, ED - Abnormal; Notable for the following:    Calcium, Ion 1.06 (*)    Hemoglobin 11.6 (*)    HCT 34.0 (*)    All other components within normal limits  AMMONIA  APTT    COMPREHENSIVE METABOLIC PANEL  RAPID URINE DRUG SCREEN, HOSP PERFORMED  ETHANOL  URINALYSIS, ROUTINE W REFLEX MICROSCOPIC  I-STAT CG4 LACTIC ACID, ED  CBG MONITORING, ED    EKG  EKG Interpretation None       Radiology No results found.  Procedures Procedures (including critical care time)  Medications Ordered in ED Medications - No data to display   Initial Impression / Assessment and Plan / ED Course  I have reviewed the triage vital signs and the nursing notes.  Pertinent labs & imaging results that were available during my care of the patient were reviewed by me and considered in my medical decision making (see chart for details).  Clinical Course as of Oct 04 1098  Sat Oct 04, 2016  6962 Normal platelets Platelets: 236 [AH]  [id number] Patient without focal neuro deficits. I doubt stroke as the cause of her AMS  [AH]  0840 Ammonia: (!) 37 [AH]  [id number] Patient is currently at baseline. Her facility states that she can pull to seated, but is wheelchair bound. She normally complains of Left leg pain and general pain all the time according to her SNF nurse.    [AH]  1019 I spoke with the Patient's son Mr. [name]. Her son feels that she has spent enough time in the Hospital recently and given her age and her prior wishes, he does not feel she needs any more work up at home. He states that she recently told them she wanted to "go home," and he feels that she is coming to terms with the end of her life and she has been suffering long enough. He agrees that she may go back to her SNF if no fractures are present. I expressed that we would be looking for strokes,seizures.   [AH]    Clinical Course User Index [AH] Arthor Captain, PA-C    Patient's transient alteration of awareness  is resolved and she appears stable to return to her skilled nursing facility. Patient has been seen in shared visit with attending physician. I discussed the case with the patient's son, who is her  healthcare POA, who agrees with the plan of care at this time.  Final Clinical Impressions(s) / ED Diagnoses   Final diagnoses:  Pain in leg, unspecified  Transient alteration of awareness    New Prescriptions New Prescriptions   No medications on file     Arthor CaptainHarris, Vaughn Beaumier, PA-C 10/04/16 1526    Glynn Octaveancour, Stephen, MD 10/05/16 2142

## 2016-10-04 NOTE — ED Notes (Signed)
Patient transported to X-ray 

## 2016-10-05 DIAGNOSIS — G0430 Acute necrotizing hemorrhagic encephalopathy, unspecified: Secondary | ICD-10-CM | POA: Diagnosis not present

## 2016-10-06 DIAGNOSIS — G309 Alzheimer's disease, unspecified: Secondary | ICD-10-CM | POA: Diagnosis not present

## 2016-10-06 DIAGNOSIS — G0430 Acute necrotizing hemorrhagic encephalopathy, unspecified: Secondary | ICD-10-CM | POA: Diagnosis not present

## 2016-10-06 DIAGNOSIS — R296 Repeated falls: Secondary | ICD-10-CM | POA: Diagnosis not present

## 2016-10-06 DIAGNOSIS — F0281 Dementia in other diseases classified elsewhere with behavioral disturbance: Secondary | ICD-10-CM | POA: Diagnosis not present

## 2016-10-07 DIAGNOSIS — R32 Unspecified urinary incontinence: Secondary | ICD-10-CM | POA: Diagnosis not present

## 2016-10-07 DIAGNOSIS — G0430 Acute necrotizing hemorrhagic encephalopathy, unspecified: Secondary | ICD-10-CM | POA: Diagnosis not present

## 2016-10-08 DIAGNOSIS — G0430 Acute necrotizing hemorrhagic encephalopathy, unspecified: Secondary | ICD-10-CM | POA: Diagnosis not present

## 2016-10-09 DIAGNOSIS — G0430 Acute necrotizing hemorrhagic encephalopathy, unspecified: Secondary | ICD-10-CM | POA: Diagnosis not present

## 2016-10-10 DIAGNOSIS — G0430 Acute necrotizing hemorrhagic encephalopathy, unspecified: Secondary | ICD-10-CM | POA: Diagnosis not present

## 2016-10-11 DIAGNOSIS — G0430 Acute necrotizing hemorrhagic encephalopathy, unspecified: Secondary | ICD-10-CM | POA: Diagnosis not present

## 2016-10-12 DIAGNOSIS — G0430 Acute necrotizing hemorrhagic encephalopathy, unspecified: Secondary | ICD-10-CM | POA: Diagnosis not present

## 2016-10-13 DIAGNOSIS — G0430 Acute necrotizing hemorrhagic encephalopathy, unspecified: Secondary | ICD-10-CM | POA: Diagnosis not present

## 2016-10-14 DIAGNOSIS — G309 Alzheimer's disease, unspecified: Secondary | ICD-10-CM | POA: Diagnosis not present

## 2016-10-14 DIAGNOSIS — R296 Repeated falls: Secondary | ICD-10-CM | POA: Diagnosis not present

## 2016-10-14 DIAGNOSIS — G0430 Acute necrotizing hemorrhagic encephalopathy, unspecified: Secondary | ICD-10-CM | POA: Diagnosis not present

## 2016-10-14 DIAGNOSIS — F0281 Dementia in other diseases classified elsewhere with behavioral disturbance: Secondary | ICD-10-CM | POA: Diagnosis not present

## 2016-10-15 DIAGNOSIS — G0430 Acute necrotizing hemorrhagic encephalopathy, unspecified: Secondary | ICD-10-CM | POA: Diagnosis not present

## 2016-10-16 DIAGNOSIS — G0430 Acute necrotizing hemorrhagic encephalopathy, unspecified: Secondary | ICD-10-CM | POA: Diagnosis not present

## 2016-10-17 DIAGNOSIS — G0430 Acute necrotizing hemorrhagic encephalopathy, unspecified: Secondary | ICD-10-CM | POA: Diagnosis not present

## 2016-10-18 DIAGNOSIS — G0430 Acute necrotizing hemorrhagic encephalopathy, unspecified: Secondary | ICD-10-CM | POA: Diagnosis not present

## 2016-10-19 DIAGNOSIS — G0430 Acute necrotizing hemorrhagic encephalopathy, unspecified: Secondary | ICD-10-CM | POA: Diagnosis not present

## 2016-10-20 DIAGNOSIS — G0430 Acute necrotizing hemorrhagic encephalopathy, unspecified: Secondary | ICD-10-CM | POA: Diagnosis not present

## 2016-10-21 DIAGNOSIS — G0430 Acute necrotizing hemorrhagic encephalopathy, unspecified: Secondary | ICD-10-CM | POA: Diagnosis not present

## 2016-10-22 ENCOUNTER — Emergency Department (HOSPITAL_COMMUNITY): Payer: Medicare HMO

## 2016-10-22 ENCOUNTER — Encounter (HOSPITAL_COMMUNITY): Payer: Self-pay | Admitting: *Deleted

## 2016-10-22 ENCOUNTER — Emergency Department (HOSPITAL_COMMUNITY)
Admission: EM | Admit: 2016-10-22 | Discharge: 2016-10-22 | Disposition: A | Discharge status: Home / Self Care | Payer: Medicare HMO | Attending: Emergency Medicine | Admitting: Emergency Medicine

## 2016-10-22 DIAGNOSIS — Z96641 Presence of right artificial hip joint: Secondary | ICD-10-CM | POA: Insufficient documentation

## 2016-10-22 DIAGNOSIS — F0390 Unspecified dementia without behavioral disturbance: Secondary | ICD-10-CM | POA: Diagnosis present

## 2016-10-22 DIAGNOSIS — Z79899 Other long term (current) drug therapy: Secondary | ICD-10-CM | POA: Diagnosis not present

## 2016-10-22 DIAGNOSIS — N189 Chronic kidney disease, unspecified: Secondary | ICD-10-CM | POA: Diagnosis not present

## 2016-10-22 DIAGNOSIS — R4182 Altered mental status, unspecified: Secondary | ICD-10-CM | POA: Diagnosis not present

## 2016-10-22 DIAGNOSIS — J989 Respiratory disorder, unspecified: Secondary | ICD-10-CM | POA: Diagnosis not present

## 2016-10-22 DIAGNOSIS — E039 Hypothyroidism, unspecified: Secondary | ICD-10-CM | POA: Insufficient documentation

## 2016-10-22 DIAGNOSIS — Z7982 Long term (current) use of aspirin: Secondary | ICD-10-CM | POA: Insufficient documentation

## 2016-10-22 DIAGNOSIS — Z87891 Personal history of nicotine dependence: Secondary | ICD-10-CM | POA: Diagnosis not present

## 2016-10-22 DIAGNOSIS — R404 Transient alteration of awareness: Secondary | ICD-10-CM | POA: Diagnosis not present

## 2016-10-22 DIAGNOSIS — R402441 Other coma, without documented Glasgow coma scale score, or with partial score reported, in the field [EMT or ambulance]: Secondary | ICD-10-CM | POA: Diagnosis not present

## 2016-10-22 DIAGNOSIS — I129 Hypertensive chronic kidney disease with stage 1 through stage 4 chronic kidney disease, or unspecified chronic kidney disease: Secondary | ICD-10-CM | POA: Diagnosis not present

## 2016-10-22 DIAGNOSIS — R069 Unspecified abnormalities of breathing: Secondary | ICD-10-CM | POA: Diagnosis not present

## 2016-10-22 DIAGNOSIS — R059 Cough, unspecified: Secondary | ICD-10-CM

## 2016-10-22 DIAGNOSIS — R066 Hiccough: Secondary | ICD-10-CM | POA: Diagnosis not present

## 2016-10-22 DIAGNOSIS — R05 Cough: Secondary | ICD-10-CM | POA: Insufficient documentation

## 2016-10-22 DIAGNOSIS — G0430 Acute necrotizing hemorrhagic encephalopathy, unspecified: Secondary | ICD-10-CM | POA: Diagnosis not present

## 2016-10-22 LAB — URINALYSIS, ROUTINE W REFLEX MICROSCOPIC
Bilirubin Urine: NEGATIVE
Glucose, UA: NEGATIVE mg/dL
Hgb urine dipstick: NEGATIVE
Ketones, ur: NEGATIVE mg/dL
NITRITE: NEGATIVE
PH: 5 (ref 5.0–8.0)
Protein, ur: NEGATIVE mg/dL
SPECIFIC GRAVITY, URINE: 1.019 (ref 1.005–1.030)
SQUAMOUS EPITHELIAL / LPF: NONE SEEN

## 2016-10-22 LAB — DIFFERENTIAL
Basophils Absolute: 0 10*3/uL (ref 0.0–0.1)
Basophils Relative: 0 %
EOS PCT: 1 %
Eosinophils Absolute: 0.1 10*3/uL (ref 0.0–0.7)
LYMPHS ABS: 1.3 10*3/uL (ref 0.7–4.0)
LYMPHS PCT: 12 %
MONO ABS: 0.6 10*3/uL (ref 0.1–1.0)
Monocytes Relative: 6 %
Neutro Abs: 8.4 10*3/uL — ABNORMAL HIGH (ref 1.7–7.7)
Neutrophils Relative %: 81 %

## 2016-10-22 LAB — BASIC METABOLIC PANEL
ANION GAP: 7 (ref 5–15)
BUN: 16 mg/dL (ref 6–20)
CALCIUM: 9 mg/dL (ref 8.9–10.3)
CHLORIDE: 107 mmol/L (ref 101–111)
CO2: 28 mmol/L (ref 22–32)
Creatinine, Ser: 0.85 mg/dL (ref 0.44–1.00)
GFR calc non Af Amer: 57 mL/min — ABNORMAL LOW (ref >60)
GLUCOSE: 102 mg/dL — AB (ref 65–99)
Potassium: 4.2 mmol/L (ref 3.5–5.1)
Sodium: 142 mmol/L (ref 135–145)

## 2016-10-22 LAB — CBC
HEMATOCRIT: 32.8 % — AB (ref 36.0–46.0)
HEMOGLOBIN: 10.7 g/dL — AB (ref 12.0–15.0)
MCH: 29.4 pg (ref 26.0–34.0)
MCHC: 32.6 g/dL (ref 30.0–36.0)
MCV: 90.1 fL (ref 78.0–100.0)
Platelets: 209 10*3/uL (ref 150–400)
RBC: 3.64 MIL/uL — AB (ref 3.87–5.11)
RDW: 13.3 % (ref 11.5–15.5)
WBC: 10.3 10*3/uL (ref 4.0–10.5)

## 2016-10-22 NOTE — ED Notes (Signed)
Patient is alert and oriented to baseline.  PTAR was given DC instructions and follow up visit instructions.  Patient unable give verbal understanding. She was DC  to nursing home.  V/S stable.  sHe was not showing any signs of distress on DC

## 2016-10-22 NOTE — ED Provider Notes (Signed)
[facility]-EMER[facility]ENCY DEPT Provider Note: Deborah Dell, Horton, FACEP  CSN: [id number] MRN: [id number] ARRIVAL: 10/22/16 at 0502 ROOM: WA23/WA23   CHIEF COMPLAINT  Cough  Level V caveat: Dementia HISTORY OF PRESENT ILLNESS  [name] is a 83 y.o. female with a history of dementia. She was sent in from her nursing facility this morning for "pneumonia". When asked how this was diagnosed EMS replied that she has had a rhonchorous cough for the past 2 days. She has not been hypoxic. She has not had a fever. She has not been tachypneic or dyspneic in their presence. They state that she was able to answer simple questions on scene as well as pivot and turn to get on the stretcher but on arrival to the ED she is minimally responsive. EMS reports nursing home staff states she is at her baseline mental status.    Past Medical History:  Diagnosis Date  . Acute bronchitis 04/10/2013  . Anemia   . Angina   . Anxiety   . Arthritis   . Arthritis 10/02/2012  . Asthma   . Chicken pox   . Chronic kidney disease   . Colon polyps   . Dehydration 08/31/2013  . Dementia   . Depression   . Dysrhythmia   . Headache(784.0)   . Heart murmur   . Hypertension   . Hypothyroidism   . Left leg pain 10/02/2012  . Pneumonia   . Recurrent upper respiratory infection (URI)   . Shortness of breath   . UTI (urinary tract infection) 12/30/2012  . Valvular heart disease 09/04/2012    Past Surgical History:  Procedure Laterality Date  . ABDOMINAL HYSTERECTOMY    . APPENDECTOMY    . BACK SURGERY    . CHOLECYSTECTOMY    . COLONOSCOPY W/ POLYPECTOMY    . EYE SURGERY     cataract removed and eye lids lifted  . feet surgery    . FRACTURE SURGERY     bilateral arms  . HAND SURGERY    . HIP ARTHROPLASTY Right 09/20/2015   Procedure: ARTHROPLASTY RIGHT  BIPOLAR ANTERIOR HIP (HEMIARTHROPLASTY);  Surgeon: Samson Frederic, Horton;  Location: [facility];  Service: Orthopedics;  Laterality: Right;  . kidney stones    . SHOULDER  ARTHROSCOPY    . TONSILLECTOMY    . TONSILLECTOMY    . TUBAL LIGATION      Family History  Problem Relation Age of Onset  . Heart disease Mother   . Heart disease Father   . Heart disease Other   . Birth defects Other     Social History  Substance Use Topics  . Smoking status: Former Smoker    Packs/day: 0.20    Years: 1.00    Types: Cigarettes    Quit date: 06/02/1941  . Smokeless tobacco: Never Used  . Alcohol use No    Prior to Admission medications   Medication Sig Start Date End Date Taking? Authorizing Provider  acetaminophen (TYLENOL) 500 MG tablet Take 500 mg by mouth every 4 (four) hours as needed for mild pain, moderate pain, fever or headache.    Yes Provider, Historical, Horton  alum & mag hydroxide-simeth (MINTOX) 200-200-20 MG/5ML suspension Take 30 mLs by mouth every 6 (six) hours as needed for indigestion or heartburn.    Yes Provider, Historical, Horton  aspirin 81 MG chewable tablet Chew 81 mg by mouth daily.   Yes Provider, Historical, Horton  clonazePAM (KLONOPIN) 0.5 MG tablet Take 1 tablet (0.5 mg  total) by mouth 2 (two) times daily. 09/23/15  Yes Deborah, Maximino Greenland, Horton  divalproex (DEPAKOTE SPRINKLE) 125 MG capsule Take 125 mg by mouth 3 (three) times daily.    Yes Provider, Historical, Horton  escitalopram (LEXAPRO) 5 MG tablet Take 5 mg by mouth daily.   Yes Provider, Historical, Horton  guaifenesin (ROBITUSSIN) 100 MG/5ML syrup Take 200 mg by mouth every 6 (six) hours as needed for cough.    Yes Provider, Historical, Horton  levothyroxine (SYNTHROID, LEVOTHROID) 50 MCG tablet Take 50 mcg by mouth daily before breakfast.    Yes Provider, Historical, Horton  loperamide (IMODIUM) 2 MG capsule Take 2 mg by mouth every 3 (three) hours as needed for diarrhea or loose stools.    Yes Provider, Historical, Horton  magnesium hydroxide (MILK OF MAGNESIA) 400 MG/5ML suspension Take 30 mLs by mouth at bedtime as needed for mild constipation.   Yes Provider, Historical, Horton  Multiple Vitamins-Minerals  (MULTIVITAMIN WITH MINERALS) tablet Take 1 tablet by mouth daily.   Yes Provider, Historical, Horton  Neomycin-Bacitracin-Polymyxin (TRIPLE ANTIBIOTIC) [phone] OINT Apply 1 application topically 3 (three) times daily as needed (for skin tears/abrasions).    Yes Provider, Historical, Horton  Nutritional Supplements (NUTRITIONAL DRINK) LIQD Take 1 Bottle by mouth 3 (three) times daily with meals. Mighty Shakes   Yes Provider, Historical, Horton  rivastigmine (EXELON) 1.5 MG capsule Take 1.5 mg by mouth 2 (two) times daily.   Yes Provider, Historical, Horton  senna (SENOKOT) 8.6 MG TABS tablet Take 2 tablets by mouth 2 (two) times daily.   Yes Provider, Historical, Horton  traMADol (ULTRAM) 50 MG tablet Take 1 tablet (50 mg total) by mouth every 6 (six) hours as needed for severe pain. 09/18/16  Yes [name]  traZODone (DESYREL) 50 MG tablet Take 50 mg by mouth at bedtime.    Yes Provider, Historical, Horton  azithromycin (ZITHROMAX) 250 MG tablet Take 1 tablet (250 mg total) by mouth daily. Patient not taking: Reported on 10/04/2016 09/18/16   [name]  cefUROXime (CEFTIN) 250 MG tablet Take 1 tablet (250 mg total) by mouth 2 (two) times daily with a meal. Patient not taking: Reported on 10/04/2016 09/18/16   [name]    Allergies Morphine and related; Codeine; and Hydrocodone   REVIEW OF SYSTEMS  Negative except as noted here or in the History of Present Illness.   PHYSICAL EXAMINATION  Initial Vital Signs Blood pressure (!) 156/81, temperature 99.5 F (37.5 C), temperature source Rectal.  Examination General: Well-developed, well-nourished female in no acute distress; appearance consistent with age of record HENT: normocephalic; atraumatic Eyes: pupils equal, round and reactive to light; arcus senilis bilaterally Neck: supple Heart: regular rate and rhythm Lungs: A few rhonchi in the bases, left greater than right Abdomen: soft; nondistended; nontender; no masses or  hepatosplenomegaly; bowel sounds present Extremities: No deformity; full range of motion; pulses normal Neurologic: Somnolent; localizes to sternal rub and noted to move all extremities; will not open eyes to voice or sternal rub Skin: Warm and dry   RESULTS  Summary of this visit's results, reviewed by myself:   EKG Interpretation  Date/Time:    Ventricular Rate:    PR Interval:    QRS Duration:   QT Interval:    QTC Calculation:   R Axis:     Text Interpretation:        Laboratory Studies: Results for orders placed or performed during the hospital encounter of 10/22/16 (from the past  24 hour(s))  Basic metabolic panel     Status: Abnormal   Collection Time: 10/22/16  5:27 AM  Result Value Ref Range   Sodium 142 135 - 145 mmol/L   Potassium 4.2 3.5 - 5.1 mmol/L   Chloride 107 101 - 111 mmol/L   CO2 28 22 - 32 mmol/L   Glucose, Bld 102 (H) 65 - 99 mg/dL   BUN 16 6 - 20 mg/dL   Creatinine, Ser 1.610.85 0.44 - 1.00 mg/dL   Calcium 9.0 8.9 - 09.610.3 mg/dL   GFR calc non Af Amer 57 (L) >60 mL/min   GFR calc Af Amer >60 >60 mL/min   Anion gap 7 5 - 15  Urinalysis, Routine w reflex microscopic     Status: Abnormal   Collection Time: 10/22/16  5:49 AM  Result Value Ref Range   Color, Urine YELLOW YELLOW   APPearance CLEAR CLEAR   Specific Gravity, Urine 1.019 1.005 - 1.030   pH 5.0 5.0 - 8.0   Glucose, UA NEGATIVE NEGATIVE mg/dL   Hgb urine dipstick NEGATIVE NEGATIVE   Bilirubin Urine NEGATIVE NEGATIVE   Ketones, ur NEGATIVE NEGATIVE mg/dL   Protein, ur NEGATIVE NEGATIVE mg/dL   Nitrite NEGATIVE NEGATIVE   Leukocytes, UA TRACE (A) NEGATIVE   RBC / HPF 6-30 0 - 5 RBC/hpf   WBC, UA 6-30 0 - 5 WBC/hpf   Bacteria, UA RARE (A) NONE SEEN   Squamous Epithelial / LPF NONE SEEN NONE SEEN   Mucous PRESENT    Hyaline Casts, UA PRESENT   CBC     Status: Abnormal   Collection Time: 10/22/16  7:00 AM  Result Value Ref Range   WBC 10.3 4.0 - 10.5 K/uL   RBC 3.64 (L) 3.87 - 5.11  MIL/uL   Hemoglobin 10.7 (L) 12.0 - 15.0 g/dL   HCT 04.532.8 (L) 40.936.0 - 81.146.0 %   MCV 90.1 78.0 - 100.0 fL   MCH 29.4 26.0 - 34.0 pg   MCHC 32.6 30.0 - 36.0 g/dL   RDW 91.413.3 78.211.5 - 95.615.5 %   Platelets 209 150 - 400 K/uL  Differential     Status: Abnormal   Collection Time: 10/22/16  7:00 AM  Result Value Ref Range   Neutrophils Relative % 81 %   Neutro Abs 8.4 (H) 1.7 - 7.7 K/uL   Lymphocytes Relative 12 %   Lymphs Abs 1.3 0.7 - 4.0 K/uL   Monocytes Relative 6 %   Monocytes Absolute 0.6 0.1 - 1.0 K/uL   Eosinophils Relative 1 %   Eosinophils Absolute 0.1 0.0 - 0.7 K/uL   Basophils Relative 0 %   Basophils Absolute 0.0 0.0 - 0.1 K/uL   Imaging Studies: Ct Head Wo Contrast  Result Date: 10/22/2016 CLINICAL DATA:  Altered mental status. Unresponsive. History of multiple falls, dementia, hypertension. EXAM: CT HEAD WITHOUT CONTRAST TECHNIQUE: Contiguous axial images were obtained from the base of the skull through the vertex without intravenous contrast. COMPARISON:  10/04/2016 FINDINGS: Brain: No evidence of acute infarction, hemorrhage, hydrocephalus, extra-axial collection or mass lesion/mass effect. Diffuse cerebral atrophy. Mild ventricular dilatation consistent with central atrophy. Low-attenuation changes in the deep white matter consistent small vessel ischemia. Vascular: Vascular calcifications are present. Skull: Calvarium appears intact. Sinuses/Orbits: No acute finding. Other: No significant changes since previous study. IMPRESSION: No acute intracranial abnormalities. Chronic atrophy and small vessel ischemic changes. Electronically Signed   By: Burman NievesWilliam  Stevens M.D.   On: 10/22/2016 06:47   [facility]  Result Date: 10/22/2016 CLINICAL DATA:  Dementia. Cough for 2 days. Altered level of consciousness. EXAM: PORTABLE CHEST 1 VIEW COMPARISON:  10/04/2016 FINDINGS: Borderline heart size with normal pulmonary vascularity. Emphysematous changes in the lungs. No airspace disease or  consolidation. No blunting of costophrenic angles. No pneumothorax. Degenerative changes in the spine and shoulders. Calcification of the aorta. IMPRESSION: Emphysematous changes in the lungs. Borderline heart size. No evidence of active pulmonary disease. Electronically Signed   By: Burman Nieves M.D.   On: 10/22/2016 05:51    ED COURSE  Nursing notes and initial vitals signs, including pulse oximetry, reviewed.  Vitals:   10/22/16 0511 10/22/16 0511  BP:  (!) 156/81  Temp: 99.5 F (37.5 C)   TempSrc: Rectal    7:36 AM Diagnostic studies reassuring. Urine borderline and sent for culture. Patient is now opening eyes and responding to her name. I suspect her earlier altered mental status was due to sundowning. She has a history of transient mental status changes in the past and has chronic dementia.. I Horton not see an indication for admission at this time. I Horton not see an indication for antibiotics at this time.  PROCEDURES    ED DIAGNOSES     ICD-9-CM ICD-10-CM   1. Cough 786.2 R05   2. Transient alteration of awareness 780.02 R40.4        Marayah Higdon, Jonny Ruiz, Horton 10/22/16 [phone]

## 2016-10-22 NOTE — ED Triage Notes (Signed)
[facility] Staff states that the patient was having a cough and had concerns of pneumonia.  Patient is demented at baseline and has rhonchi in all fields.  UTA any pain.

## 2016-10-23 DIAGNOSIS — G0430 Acute necrotizing hemorrhagic encephalopathy, unspecified: Secondary | ICD-10-CM | POA: Diagnosis not present

## 2016-10-24 DIAGNOSIS — F0390 Unspecified dementia without behavioral disturbance: Secondary | ICD-10-CM | POA: Diagnosis not present

## 2016-10-24 DIAGNOSIS — G0430 Acute necrotizing hemorrhagic encephalopathy, unspecified: Secondary | ICD-10-CM | POA: Diagnosis not present

## 2016-10-24 DIAGNOSIS — W010XXA Fall on same level from slipping, tripping and stumbling without subsequent striking against object, initial encounter: Secondary | ICD-10-CM | POA: Diagnosis not present

## 2016-10-24 DIAGNOSIS — M79605 Pain in left leg: Secondary | ICD-10-CM | POA: Diagnosis not present

## 2016-10-24 LAB — URINE CULTURE

## 2016-10-25 ENCOUNTER — Telehealth: Payer: Self-pay

## 2016-10-25 DIAGNOSIS — G0430 Acute necrotizing hemorrhagic encephalopathy, unspecified: Secondary | ICD-10-CM | POA: Diagnosis not present

## 2016-10-25 NOTE — Telephone Encounter (Signed)
No treatment for [facility] ED 10/22/16 Per Mia McDonald PA-C

## 2016-10-26 DIAGNOSIS — G0430 Acute necrotizing hemorrhagic encephalopathy, unspecified: Secondary | ICD-10-CM | POA: Diagnosis not present

## 2016-10-26 DIAGNOSIS — R05 Cough: Secondary | ICD-10-CM | POA: Diagnosis not present

## 2016-10-26 DIAGNOSIS — A05 Other bacterial foodborne intoxications, not elsewhere classified: Secondary | ICD-10-CM | POA: Diagnosis not present

## 2016-10-27 DIAGNOSIS — G0430 Acute necrotizing hemorrhagic encephalopathy, unspecified: Secondary | ICD-10-CM | POA: Diagnosis not present

## 2016-10-28 DIAGNOSIS — G309 Alzheimer's disease, unspecified: Secondary | ICD-10-CM | POA: Diagnosis not present

## 2016-10-28 DIAGNOSIS — G0430 Acute necrotizing hemorrhagic encephalopathy, unspecified: Secondary | ICD-10-CM | POA: Diagnosis not present

## 2016-10-28 DIAGNOSIS — J439 Emphysema, unspecified: Secondary | ICD-10-CM | POA: Diagnosis not present

## 2016-10-28 DIAGNOSIS — F0281 Dementia in other diseases classified elsewhere with behavioral disturbance: Secondary | ICD-10-CM | POA: Diagnosis not present

## 2016-10-29 ENCOUNTER — Encounter (HOSPITAL_COMMUNITY): Payer: Self-pay

## 2016-10-29 ENCOUNTER — Emergency Department (HOSPITAL_COMMUNITY)
Admission: EM | Admit: 2016-10-29 | Discharge: 2016-10-30 | Disposition: A | Discharge status: Other Acute Inpatient Hospital | Payer: Medicare HMO | Attending: Emergency Medicine | Admitting: Emergency Medicine

## 2016-10-29 ENCOUNTER — Emergency Department (HOSPITAL_COMMUNITY): Payer: Medicare HMO

## 2016-10-29 DIAGNOSIS — R9431 Abnormal electrocardiogram [ECG] [EKG]: Secondary | ICD-10-CM | POA: Diagnosis not present

## 2016-10-29 DIAGNOSIS — I129 Hypertensive chronic kidney disease with stage 1 through stage 4 chronic kidney disease, or unspecified chronic kidney disease: Secondary | ICD-10-CM | POA: Diagnosis not present

## 2016-10-29 DIAGNOSIS — F05 Delirium due to known physiological condition: Secondary | ICD-10-CM | POA: Diagnosis not present

## 2016-10-29 DIAGNOSIS — R531 Weakness: Secondary | ICD-10-CM | POA: Diagnosis not present

## 2016-10-29 DIAGNOSIS — R509 Fever, unspecified: Secondary | ICD-10-CM | POA: Diagnosis not present

## 2016-10-29 DIAGNOSIS — N189 Chronic kidney disease, unspecified: Secondary | ICD-10-CM | POA: Insufficient documentation

## 2016-10-29 DIAGNOSIS — Z87891 Personal history of nicotine dependence: Secondary | ICD-10-CM | POA: Insufficient documentation

## 2016-10-29 DIAGNOSIS — J45909 Unspecified asthma, uncomplicated: Secondary | ICD-10-CM | POA: Insufficient documentation

## 2016-10-29 DIAGNOSIS — Z7982 Long term (current) use of aspirin: Secondary | ICD-10-CM | POA: Insufficient documentation

## 2016-10-29 DIAGNOSIS — Z79899 Other long term (current) drug therapy: Secondary | ICD-10-CM | POA: Insufficient documentation

## 2016-10-29 DIAGNOSIS — M6281 Muscle weakness (generalized): Secondary | ICD-10-CM | POA: Diagnosis not present

## 2016-10-29 DIAGNOSIS — R05 Cough: Secondary | ICD-10-CM | POA: Diagnosis not present

## 2016-10-29 LAB — BASIC METABOLIC PANEL
Anion gap: 7 (ref 5–15)
BUN: 20 mg/dL (ref 6–20)
CO2: 29 mmol/L (ref 22–32)
Calcium: 8.9 mg/dL (ref 8.9–10.3)
Chloride: 109 mmol/L (ref 101–111)
Creatinine, Ser: 0.9 mg/dL (ref 0.44–1.00)
GFR calc Af Amer: >60 mL/min (ref >60)
GFR calc non Af Amer: 53 mL/min — ABNORMAL LOW (ref >60)
Glucose, Bld: 134 mg/dL — ABNORMAL HIGH (ref 65–99)
Potassium: 3.8 mmol/L (ref 3.5–5.1)
Sodium: 145 mmol/L (ref 135–145)

## 2016-10-29 LAB — CBC
HCT: 34.2 % — ABNORMAL LOW (ref 36.0–46.0)
Hemoglobin: 11.2 g/dL — ABNORMAL LOW (ref 12.0–15.0)
MCH: 29.4 pg (ref 26.0–34.0)
MCHC: 32.7 g/dL (ref 30.0–36.0)
MCV: 89.8 fL (ref 78.0–100.0)
Platelets: 234 10*3/uL (ref 150–400)
RBC: 3.81 MIL/uL — ABNORMAL LOW (ref 3.87–5.11)
RDW: 13.2 % (ref 11.5–15.5)
WBC: 7.1 10*3/uL (ref 4.0–10.5)

## 2016-10-29 LAB — CBG MONITORING, ED: Glucose-Capillary: 116 mg/dL — ABNORMAL HIGH (ref 65–99)

## 2016-10-29 NOTE — ED Triage Notes (Signed)
[facility] c/o "just not feeling right-kinda weak" [sic]. [facility] Earlier today had a fever of 99.4 F. She arrives in no distress with occasional congested cough noted.

## 2016-10-29 NOTE — ED Provider Notes (Signed)
[facility]-EMERGENCY DEPT Provider Note   CSN: [id number] Arrival date & time: 10/29/16  1905  By signing my name below, I, Linna Darner, attest that this documentation has been prepared under the direction and in the presence of physician practitioner, Raeford Razor, MD. Electronically Signed: Linna Darner, Scribe. 10/29/2016. 7:53 PM.  History   Chief Complaint Chief Complaint  Patient presents with  . Fever  . Weakness   The history is provided by the patient and medical records. No language interpreter was used.   HPI Comments: LEVEL 5 CAVEAT DUE TO CONFUSION [name] is a 83 y.o. female with PMHx including anemia, dementia, CKD, and HTN who presents to the Emergency Department via EMS for evaluation of global weakness onset today. The staff at [facility] called EMS because patient was globally weak upon waking and her weakness persisted. Triage note indicates that patient had a mild fever at her nursing home but her temperature was measured at 97.9 today in the ED. Patient states she feels "all right" currently. Patient reports an intermittent non-productive cough and occasional, mild chest pain for several days. Patient denies chest pain currently. She further denies dyspnea, abdominal pain, nausea, vomiting, diarrhea, urinary retention, dysuria, back pain, or any other associated symptoms.  Past Medical History:  Diagnosis Date  . Acute bronchitis 04/10/2013  . Anemia   . Angina   . Anxiety   . Arthritis   . Arthritis 10/02/2012  . Asthma   . Chicken pox   . Chronic kidney disease   . Colon polyps   . Dehydration 08/31/2013  . Dementia   . Depression   . Dysrhythmia   . Headache(784.0)   . Heart murmur   . Hypertension   . Hypothyroidism   . Left leg pain 10/02/2012  . Pneumonia   . Recurrent upper respiratory infection (URI)   . Shortness of breath   . UTI (urinary tract infection) 12/30/2012  . Valvular heart disease 09/04/2012    Patient Active Problem List   Diagnosis Date Noted  . Tibial fracture 09/16/2016  . Left fibular fracture 09/16/2016  . Leukocytosis 09/16/2016  . Pressure ulcer 09/20/2015  . Displaced fracture of right femoral neck (HCC) 09/19/2015  . Altered mental status   . Confusion 06/26/2015  . Fall   . Aspiration pneumonia (HCC) 01/10/2015  . Acute encephalopathy 01/08/2015  . UTI (urinary tract infection) 01/08/2015  . 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 Recurrent falls 11/27/2014  . FTT (failure to thrive) in adult 10/26/2014  . Dehydration 10/26/2014  . Abnormal urinalysis 10/26/2014  . Normocytic anemia 10/26/2014  . Lump of skin of left upper extremity 09/07/2014  . Rash and nonspecific skin eruption 05/08/2014  . Syncope and collapse 11/30/2013  . Low back pain, episodic 11/20/2013  . Chronic kidney disease   . Insomnia 08/31/2013  . Arthritis 10/02/2012  . Valvular heart disease 09/04/2012  . Restless leg syndrome 06/06/2012  . Trochanteric bursitis 08/02/2011  . Dementia 08/02/2011  . Leg pain 03/28/2011  . Hypertension 09/22/2010  . Hypothyroidism 09/22/2010    Past Surgical History:  Procedure Laterality Date  . ABDOMINAL HYSTERECTOMY    . APPENDECTOMY    . BACK SURGERY    . CHOLECYSTECTOMY    . COLONOSCOPY W/ POLYPECTOMY    . EYE SURGERY     cataract removed and eye lids lifted  . feet surgery    . FRACTURE SURGERY     bilateral arms  . HAND SURGERY    .  HIP ARTHROPLASTY Right 09/20/2015   Procedure: ARTHROPLASTY RIGHT  BIPOLAR ANTERIOR HIP (HEMIARTHROPLASTY);  Surgeon: Samson FredericBrian Swinteck, MD;  Location: [facility];  Service: Orthopedics;  Laterality: Right;  . kidney stones    . SHOULDER ARTHROSCOPY    . TONSILLECTOMY    . TONSILLECTOMY    . TUBAL LIGATION      OB History    No data available       Home Medications    Prior to Admission medications   Medication Sig Start Date End Date Taking? Authorizing Provider  acetaminophen (TYLENOL) 500 MG tablet Take 500  mg by mouth every 4 (four) hours as needed for mild pain, moderate pain, fever or headache.     [provider]  alum & mag hydroxide-simeth (MINTOX) 200-200-20 MG/5ML suspension Take 30 mLs by mouth every 6 (six) hours as needed for indigestion or heartburn.     [provider]  aspirin 81 MG chewable tablet Chew 81 mg by mouth daily.    [provider]  clonazePAM (KLONOPIN) 0.5 MG tablet Take 1 tablet (0.5 mg total) by mouth 2 (two) times daily. 09/23/15   Hongalgi, Maximino GreenlandAnand D, MD  divalproex (DEPAKOTE SPRINKLE) 125 MG capsule Take 125 mg by mouth 3 (three) times daily.     [provider]  escitalopram (LEXAPRO) 5 MG tablet Take 5 mg by mouth daily.    [provider]  guaifenesin (ROBITUSSIN) 100 MG/5ML syrup Take 200 mg by mouth every 6 (six) hours as needed for cough.     [provider]  levothyroxine (SYNTHROID, LEVOTHROID) 50 MCG tablet Take 50 mcg by mouth daily before breakfast.     [provider]  loperamide (IMODIUM) 2 MG capsule Take 2 mg by mouth every 3 (three) hours as needed for diarrhea or loose stools.     [provider]  magnesium hydroxide (MILK OF MAGNESIA) 400 MG/5ML suspension Take 30 mLs by mouth at bedtime as needed for mild constipation.    [provider]  Multiple Vitamins-Minerals (MULTIVITAMIN WITH MINERALS) tablet Take 1 tablet by mouth daily.    [provider]  Neomycin-Bacitracin-Polymyxin (TRIPLE ANTIBIOTIC) [phone] OINT Apply 1 application topically 3 (three) times daily as needed (for skin tears/abrasions).     [provider]  Nutritional Supplements (NUTRITIONAL DRINK) LIQD Take 1 Bottle by mouth 3 (three) times daily with meals. Mighty Shakes    [provider]  rivastigmine (EXELON) 1.5 MG capsule Take 1.5 mg by mouth 2 (two) times daily.    [provider]  senna (SENOKOT) 8.6 MG TABS tablet Take 2 tablets by mouth 2 (two) times daily.     [provider]  traMADol (ULTRAM) 50 MG tablet Take 1 tablet (50 mg total) by mouth every 6 (six) hours as needed for severe pain. 09/18/16   Edsel PetrinMikhail, Maryann, DO  traZODone (DESYREL) 50 MG tablet Take 50 mg by mouth at bedtime.     [provider]    Family History Family History  Problem Relation Age of Onset  . Heart disease Mother   . Heart disease Father   . Heart disease Other   . Birth defects Other     Social History Social History  Substance Use Topics  . Smoking status: Former Smoker    Packs/day: 0.20    Years: 1.00    Types: Cigarettes    Quit date: 06/02/1941  . Smokeless tobacco: Never Used  . Alcohol use No     Allergies  Morphine and related; Codeine; and Hydrocodone   Review of Systems Review of Systems  Unable to perform ROS: Mental status change   Physical Exam Updated Vital Signs BP (!) 158/62   Pulse 63   Temp 97.9 F (36.6 C) (Oral)   Resp 16   SpO2 96%   Physical Exam  Constitutional: She appears well-developed and well-nourished.  HENT:  Head: Normocephalic.  Right Ear: External ear normal.  Left Ear: External ear normal.  Nose: Nose normal.  Mouth/Throat: Oropharynx is clear and moist.  Eyes: Conjunctivae are normal. Right eye exhibits no discharge. Left eye exhibits no discharge.  Neck: Normal range of motion.  Cardiovascular: Normal rate, regular rhythm and normal heart sounds.   No murmur heard. Pulmonary/Chest: Effort normal. No respiratory distress.  Coarse breath sounds bilaterally.  Abdominal: Soft. She exhibits no distension. There is no tenderness. There is no rebound and no guarding.  Musculoskeletal: Normal range of motion. She exhibits no edema or tenderness.  Neurological: She is alert. No cranial nerve deficit. Coordination normal.  Skin: Skin is warm and dry. No rash noted. No erythema. No pallor.  Psychiatric: She has a normal mood and affect. Her behavior is normal.  Nursing note and vitals  reviewed.  ED Treatments / Results  Labs (all labs ordered are listed, but only abnormal results are displayed) Labs Reviewed  URINE CULTURE - Abnormal; Notable for the following:       Result Value   Culture 50,000 COLONIES/mL ENTEROCOCCUS FAECALIS (*)    Organism ID, Bacteria ENTEROCOCCUS FAECALIS (*)    All other components within normal limits  BASIC METABOLIC PANEL - Abnormal; Notable for the following:    Glucose, Bld 134 (*)    GFR calc non Af Amer 53 (*)    All other components within normal limits  CBC - Abnormal; Notable for the following:    RBC 3.81 (*)    Hemoglobin 11.2 (*)    HCT 34.2 (*)    All other components within normal limits  URINALYSIS, ROUTINE W REFLEX MICROSCOPIC - Abnormal; Notable for the following:    Hgb urine dipstick MODERATE (*)    Leukocytes, UA MODERATE (*)    Bacteria, UA RARE (*)    All other components within normal limits  CBG MONITORING, ED - Abnormal; Notable for the following:    Glucose-Capillary 116 (*)    All other components within normal limits    EKG  EKG Interpretation None       Radiology No results found.  Procedures Procedures (including critical care time)  DIAGNOSTIC STUDIES: Oxygen Saturation is 96% on RA, adequate by my interpretation.   Medications Ordered in ED Medications - No data to display   Initial Impression / Assessment and Plan / ED Course  I have reviewed the triage vital signs and the nursing notes.  Pertinent labs & imaging results that were available during my care of the patient were reviewed by me and considered in my medical decision making (see chart for details).     89yF sent for evaluation of "fever" of 99.4. She really doesn't have much in terms of complaints although she isn't a good historian. Exam is reassuring. UA pending. I think she can appropriately be discharged with abx if has UTI.   Final Clinical Impressions(s) / ED Diagnoses   Final diagnoses:  Generalized weakness      New Prescriptions New Prescriptions   No medications on file   I personally preformed the services scribed  in my presence. The recorded information has been reviewed is accurate. Raeford Razor, MD.     Raeford Razor, MD 11/10/16 [phone]

## 2016-10-29 NOTE — ED Notes (Signed)
Bed: RESA Expected date:  Expected time:  Means of arrival:  Comments: EMS/Fever/confusion

## 2016-10-30 DIAGNOSIS — R4182 Altered mental status, unspecified: Secondary | ICD-10-CM | POA: Diagnosis not present

## 2016-10-30 DIAGNOSIS — N390 Urinary tract infection, site not specified: Secondary | ICD-10-CM | POA: Diagnosis not present

## 2016-10-30 DIAGNOSIS — G0430 Acute necrotizing hemorrhagic encephalopathy, unspecified: Secondary | ICD-10-CM | POA: Diagnosis not present

## 2016-10-30 DIAGNOSIS — R509 Fever, unspecified: Secondary | ICD-10-CM | POA: Diagnosis not present

## 2016-10-30 DIAGNOSIS — M6281 Muscle weakness (generalized): Secondary | ICD-10-CM | POA: Diagnosis not present

## 2016-10-30 LAB — URINALYSIS, ROUTINE W REFLEX MICROSCOPIC
Bilirubin Urine: NEGATIVE
Glucose, UA: NEGATIVE mg/dL
Ketones, ur: NEGATIVE mg/dL
Nitrite: NEGATIVE
Protein, ur: NEGATIVE mg/dL
Specific Gravity, Urine: 1.021 (ref 1.005–1.030)
Squamous Epithelial / LPF: NONE SEEN
pH: 5 (ref 5.0–8.0)

## 2016-10-30 MED ORDER — CEPHALEXIN 500 MG PO CAPS: 500.0000 mg | ORAL_CAPSULE | Freq: Three times a day (TID) | ORAL | 0 refills | Status: DC

## 2016-10-31 DIAGNOSIS — G0430 Acute necrotizing hemorrhagic encephalopathy, unspecified: Secondary | ICD-10-CM | POA: Diagnosis not present

## 2016-11-01 DIAGNOSIS — G0430 Acute necrotizing hemorrhagic encephalopathy, unspecified: Secondary | ICD-10-CM | POA: Diagnosis not present

## 2016-11-01 LAB — URINE CULTURE: Culture: 50000 — AB

## 2016-11-02 ENCOUNTER — Telehealth: Payer: Self-pay

## 2016-11-02 DIAGNOSIS — G0430 Acute necrotizing hemorrhagic encephalopathy, unspecified: Secondary | ICD-10-CM | POA: Diagnosis not present

## 2016-11-02 NOTE — Progress Notes (Signed)
ED Antimicrobial Stewardship Positive Culture Follow Up   Deborah CorpusStella P Horton is an 83 y.o. female who [facility] with a chief complaint of  Chief Complaint  Patient presents with  . Fever  . Weakness    Recent Results (from the past 720 hour(s))  Urine culture     Status: Abnormal   Collection Time: 10/22/16  5:49 AM  Result Value Ref Range Status   Specimen Description URINE, CATHETERIZED  Final   Special Requests NONE  Final   Culture >=100,000 COLONIES/mL ENTEROCOCCUS FAECALIS (A)  Final   Report Status 10/24/2016 FINAL  Final   Organism ID, Bacteria ENTEROCOCCUS FAECALIS (A)  Final      Susceptibility   Enterococcus faecalis - MIC*    AMPICILLIN <=2 SENSITIVE Sensitive     LEVOFLOXACIN 0.5 SENSITIVE Sensitive     NITROFURANTOIN <=16 SENSITIVE Sensitive     VANCOMYCIN 1 SENSITIVE Sensitive     * >=100,000 COLONIES/mL ENTEROCOCCUS FAECALIS  Urine culture     Status: Abnormal   Collection Time: 10/30/16 12:02 AM  Result Value Ref Range Status   Specimen Description URINE, CATHETERIZED  Final   Special Requests NONE  Final   Culture 50,000 COLONIES/mL ENTEROCOCCUS FAECALIS (A)  Final   Report Status 11/01/2016 FINAL  Final   Organism ID, Bacteria ENTEROCOCCUS FAECALIS (A)  Final      Susceptibility   Enterococcus faecalis - MIC*    AMPICILLIN <=2 SENSITIVE Sensitive     LEVOFLOXACIN 1 SENSITIVE Sensitive     NITROFURANTOIN <=16 SENSITIVE Sensitive     VANCOMYCIN 1 SENSITIVE Sensitive     * 50,000 COLONIES/mL ENTEROCOCCUS FAECALIS    [x]  Treated with cephalexin, organism resistant to prescribed antimicrobial   New antibiotic prescription: Stop cephalexin. Start amoxicillin 500 mg BID x 7 days.  ED Provider: [place] Russo, PA-C  Casilda Carlsaylor Correne Lalani, PharmD, BCPS PGY-2 Infectious Diseases Pharmacy Resident Pager: [phone] 11/02/2016, 12:37 PM

## 2016-11-02 NOTE — Telephone Encounter (Signed)
[facility] [phone]

## 2016-11-03 DIAGNOSIS — G0430 Acute necrotizing hemorrhagic encephalopathy, unspecified: Secondary | ICD-10-CM | POA: Diagnosis not present

## 2016-11-04 DIAGNOSIS — F0281 Dementia in other diseases classified elsewhere with behavioral disturbance: Secondary | ICD-10-CM | POA: Diagnosis not present

## 2016-11-04 DIAGNOSIS — N390 Urinary tract infection, site not specified: Secondary | ICD-10-CM | POA: Diagnosis not present

## 2016-11-04 DIAGNOSIS — R296 Repeated falls: Secondary | ICD-10-CM | POA: Diagnosis not present

## 2016-11-04 DIAGNOSIS — G0430 Acute necrotizing hemorrhagic encephalopathy, unspecified: Secondary | ICD-10-CM | POA: Diagnosis not present

## 2016-11-04 DIAGNOSIS — G309 Alzheimer's disease, unspecified: Secondary | ICD-10-CM | POA: Diagnosis not present

## 2016-11-05 DIAGNOSIS — G0430 Acute necrotizing hemorrhagic encephalopathy, unspecified: Secondary | ICD-10-CM | POA: Diagnosis not present

## 2016-11-06 DIAGNOSIS — G0430 Acute necrotizing hemorrhagic encephalopathy, unspecified: Secondary | ICD-10-CM | POA: Diagnosis not present

## 2016-11-07 DIAGNOSIS — G0430 Acute necrotizing hemorrhagic encephalopathy, unspecified: Secondary | ICD-10-CM | POA: Diagnosis not present

## 2016-11-08 DIAGNOSIS — G0430 Acute necrotizing hemorrhagic encephalopathy, unspecified: Secondary | ICD-10-CM | POA: Diagnosis not present

## 2016-11-09 DIAGNOSIS — G0430 Acute necrotizing hemorrhagic encephalopathy, unspecified: Secondary | ICD-10-CM | POA: Diagnosis not present

## 2016-11-10 DIAGNOSIS — G0430 Acute necrotizing hemorrhagic encephalopathy, unspecified: Secondary | ICD-10-CM | POA: Diagnosis not present

## 2016-11-10 DIAGNOSIS — R32 Unspecified urinary incontinence: Secondary | ICD-10-CM | POA: Diagnosis not present

## 2016-11-11 DIAGNOSIS — G0430 Acute necrotizing hemorrhagic encephalopathy, unspecified: Secondary | ICD-10-CM | POA: Diagnosis not present

## 2016-11-12 DIAGNOSIS — G0430 Acute necrotizing hemorrhagic encephalopathy, unspecified: Secondary | ICD-10-CM | POA: Diagnosis not present

## 2016-11-13 DIAGNOSIS — G0430 Acute necrotizing hemorrhagic encephalopathy, unspecified: Secondary | ICD-10-CM | POA: Diagnosis not present

## 2016-11-14 DIAGNOSIS — F0281 Dementia in other diseases classified elsewhere with behavioral disturbance: Secondary | ICD-10-CM | POA: Diagnosis not present

## 2016-11-14 DIAGNOSIS — F330 Major depressive disorder, recurrent, mild: Secondary | ICD-10-CM | POA: Diagnosis not present

## 2016-11-14 DIAGNOSIS — F411 Generalized anxiety disorder: Secondary | ICD-10-CM | POA: Diagnosis not present

## 2016-11-14 DIAGNOSIS — G0430 Acute necrotizing hemorrhagic encephalopathy, unspecified: Secondary | ICD-10-CM | POA: Diagnosis not present

## 2016-11-15 DIAGNOSIS — G0430 Acute necrotizing hemorrhagic encephalopathy, unspecified: Secondary | ICD-10-CM | POA: Diagnosis not present

## 2016-11-16 DIAGNOSIS — G0430 Acute necrotizing hemorrhagic encephalopathy, unspecified: Secondary | ICD-10-CM | POA: Diagnosis not present

## 2016-11-17 DIAGNOSIS — G0430 Acute necrotizing hemorrhagic encephalopathy, unspecified: Secondary | ICD-10-CM | POA: Diagnosis not present

## 2016-11-18 DIAGNOSIS — G0430 Acute necrotizing hemorrhagic encephalopathy, unspecified: Secondary | ICD-10-CM | POA: Diagnosis not present

## 2016-11-19 DIAGNOSIS — G0430 Acute necrotizing hemorrhagic encephalopathy, unspecified: Secondary | ICD-10-CM | POA: Diagnosis not present

## 2016-11-20 DIAGNOSIS — G0430 Acute necrotizing hemorrhagic encephalopathy, unspecified: Secondary | ICD-10-CM | POA: Diagnosis not present

## 2016-11-21 DIAGNOSIS — G0430 Acute necrotizing hemorrhagic encephalopathy, unspecified: Secondary | ICD-10-CM | POA: Diagnosis not present

## 2016-11-22 DIAGNOSIS — G0430 Acute necrotizing hemorrhagic encephalopathy, unspecified: Secondary | ICD-10-CM | POA: Diagnosis not present

## 2016-11-23 DIAGNOSIS — G0430 Acute necrotizing hemorrhagic encephalopathy, unspecified: Secondary | ICD-10-CM | POA: Diagnosis not present

## 2016-11-24 DIAGNOSIS — G0430 Acute necrotizing hemorrhagic encephalopathy, unspecified: Secondary | ICD-10-CM | POA: Diagnosis not present

## 2016-11-25 DIAGNOSIS — G0430 Acute necrotizing hemorrhagic encephalopathy, unspecified: Secondary | ICD-10-CM | POA: Diagnosis not present

## 2016-11-26 DIAGNOSIS — G0430 Acute necrotizing hemorrhagic encephalopathy, unspecified: Secondary | ICD-10-CM | POA: Diagnosis not present

## 2016-11-27 DIAGNOSIS — G0430 Acute necrotizing hemorrhagic encephalopathy, unspecified: Secondary | ICD-10-CM | POA: Diagnosis not present

## 2016-11-28 DIAGNOSIS — G0430 Acute necrotizing hemorrhagic encephalopathy, unspecified: Secondary | ICD-10-CM | POA: Diagnosis not present

## 2016-11-29 DIAGNOSIS — G0430 Acute necrotizing hemorrhagic encephalopathy, unspecified: Secondary | ICD-10-CM | POA: Diagnosis not present

## 2016-11-30 DIAGNOSIS — G0430 Acute necrotizing hemorrhagic encephalopathy, unspecified: Secondary | ICD-10-CM | POA: Diagnosis not present

## 2016-12-01 DIAGNOSIS — G0430 Acute necrotizing hemorrhagic encephalopathy, unspecified: Secondary | ICD-10-CM | POA: Diagnosis not present

## 2016-12-02 DIAGNOSIS — G0430 Acute necrotizing hemorrhagic encephalopathy, unspecified: Secondary | ICD-10-CM | POA: Diagnosis not present

## 2016-12-03 DIAGNOSIS — G0430 Acute necrotizing hemorrhagic encephalopathy, unspecified: Secondary | ICD-10-CM | POA: Diagnosis not present

## 2016-12-04 DIAGNOSIS — G0430 Acute necrotizing hemorrhagic encephalopathy, unspecified: Secondary | ICD-10-CM | POA: Diagnosis not present

## 2016-12-05 DIAGNOSIS — G0430 Acute necrotizing hemorrhagic encephalopathy, unspecified: Secondary | ICD-10-CM | POA: Diagnosis not present

## 2016-12-06 DIAGNOSIS — G0430 Acute necrotizing hemorrhagic encephalopathy, unspecified: Secondary | ICD-10-CM | POA: Diagnosis not present

## 2016-12-06 IMAGING — CR DG HIP (WITH OR WITHOUT PELVIS) 2-3V*R*
3 series · 3 of 3 positions shown · non-contrast
Comparison: 02/06/2015

CLINICAL DATA: Dementia. Right hip pain, 3 days duration. No known
injury.

EXAM:
DG HIP (WITH OR WITHOUT PELVIS) 2-3V RIGHT

[w hip lat right]
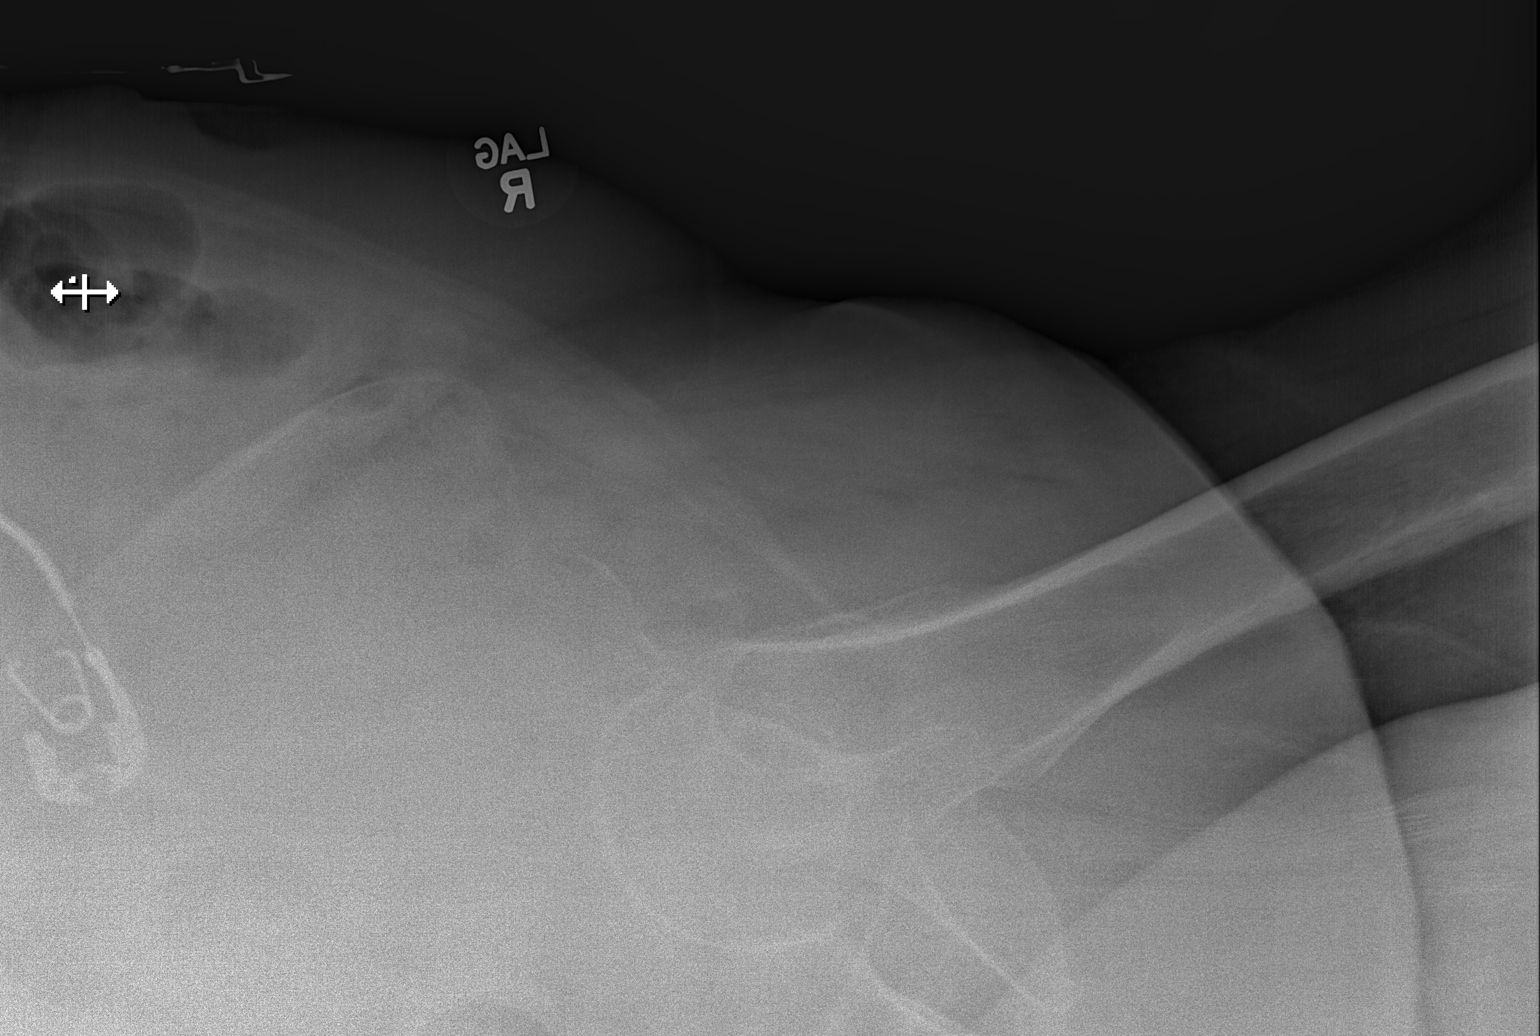

[x pelvis]
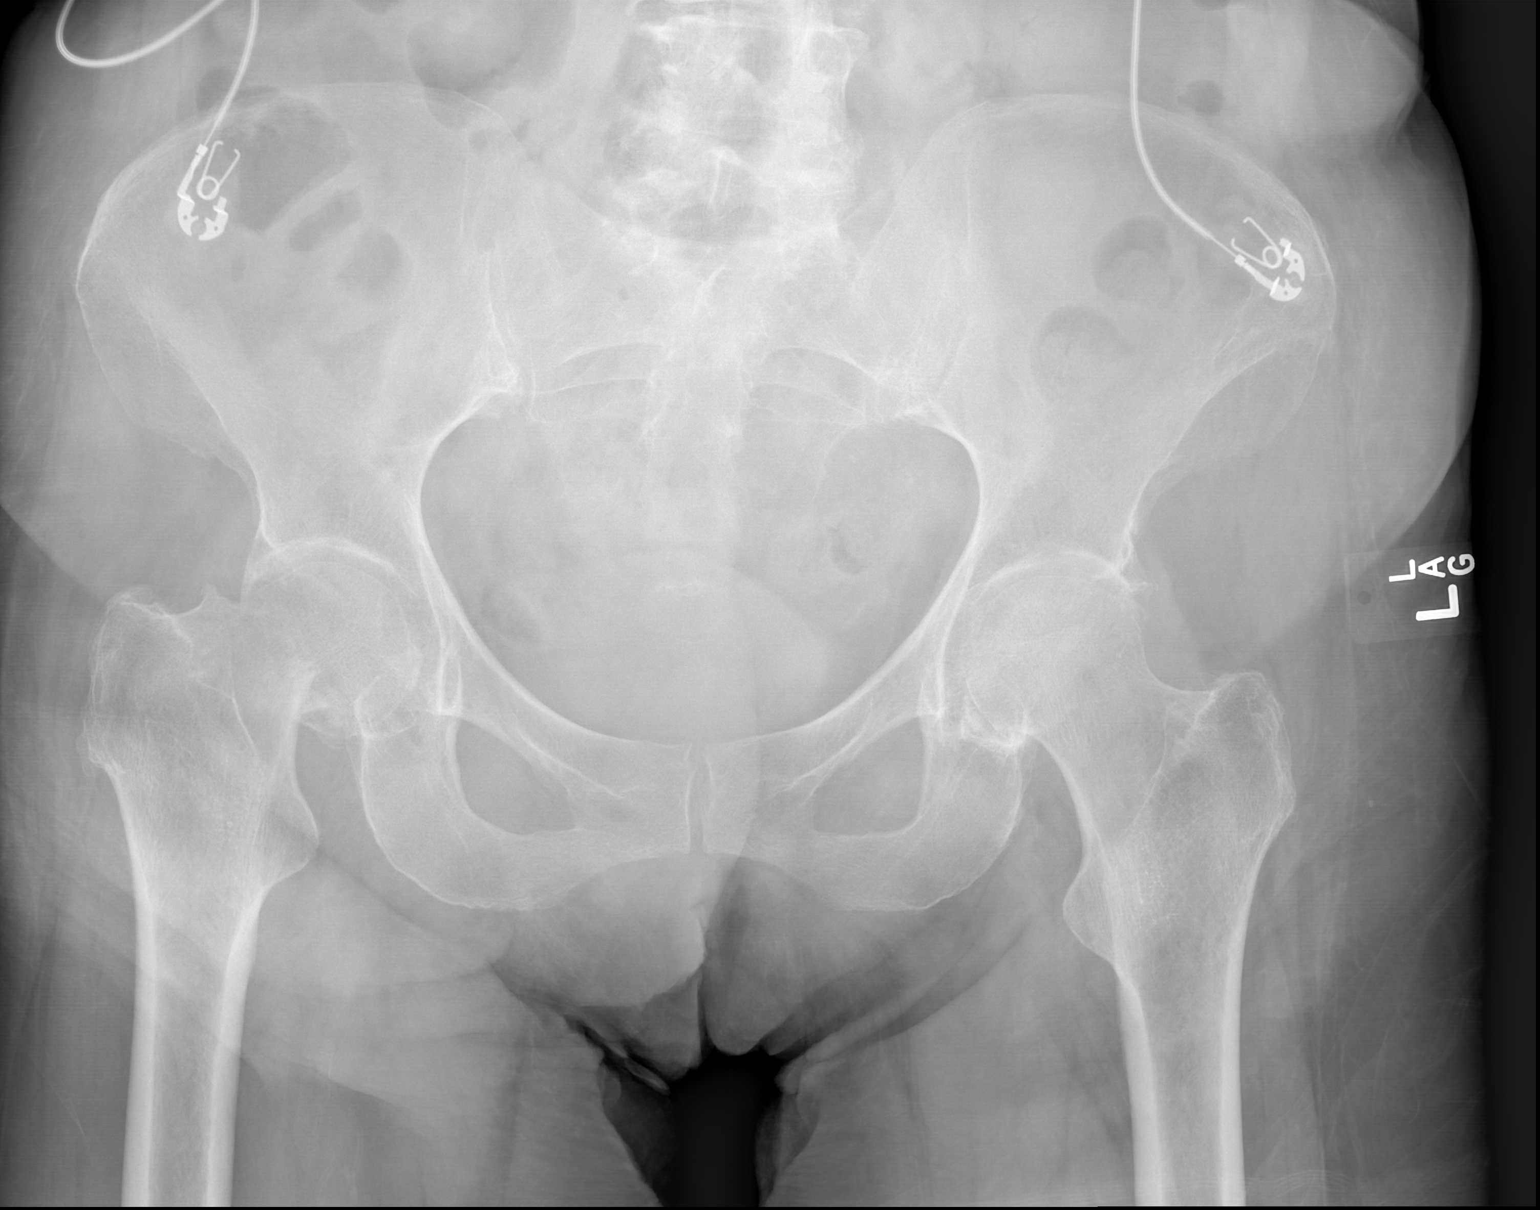

[x hip ap right]
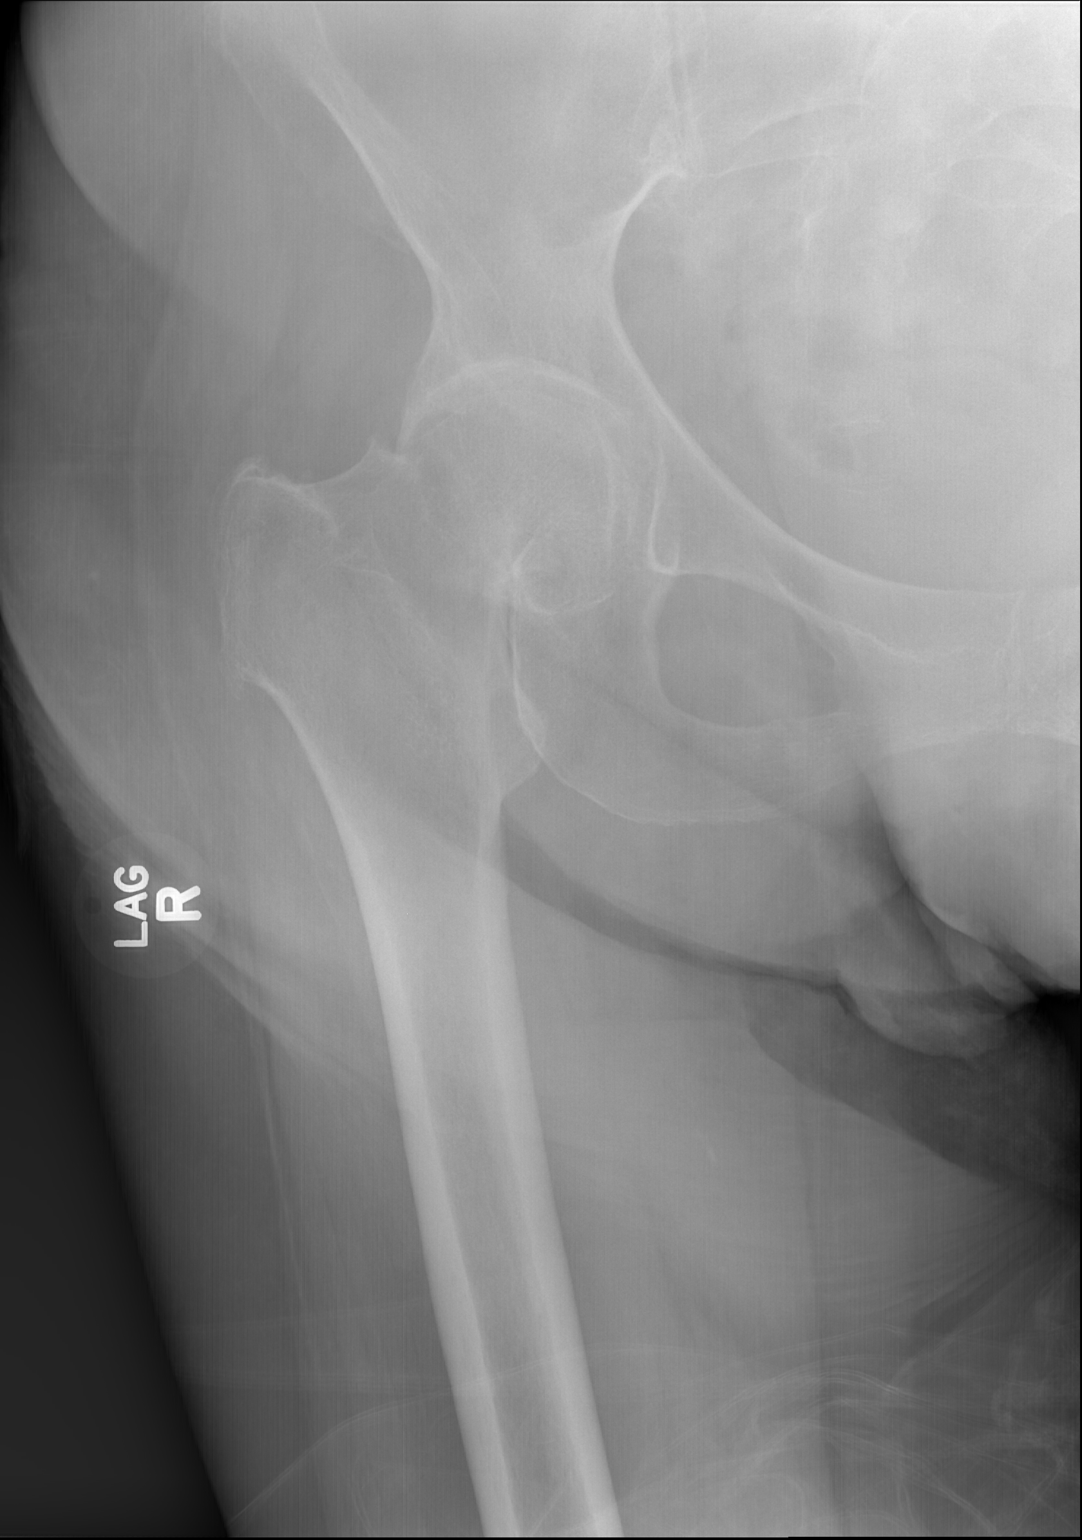

[3 of 3 positions shown; findings below may reference images not displayed]

FINDINGS: Complete femoral neck fracture on the right with displacement of 1
cm. No other pelvic fracture. Left hip appears normal.
IMPRESSION: Complete femoral neck fracture on the right, displaced 1 cm.

## 2016-12-07 DIAGNOSIS — G0430 Acute necrotizing hemorrhagic encephalopathy, unspecified: Secondary | ICD-10-CM | POA: Diagnosis not present

## 2016-12-07 IMAGING — DX DG PORTABLE PELVIS
1 series · 1 of 1 positions shown · non-contrast
Comparison: Intraoperative fluoroscopic images 4 1541. Pelvis
06/08/2015

CLINICAL DATA: Postoperative right hip replacement

EXAM:
DG C-ARM 1-60 MIN-NO REPORT; PORTABLE PELVIS 1-2 VIEWS

[pelvis ap]
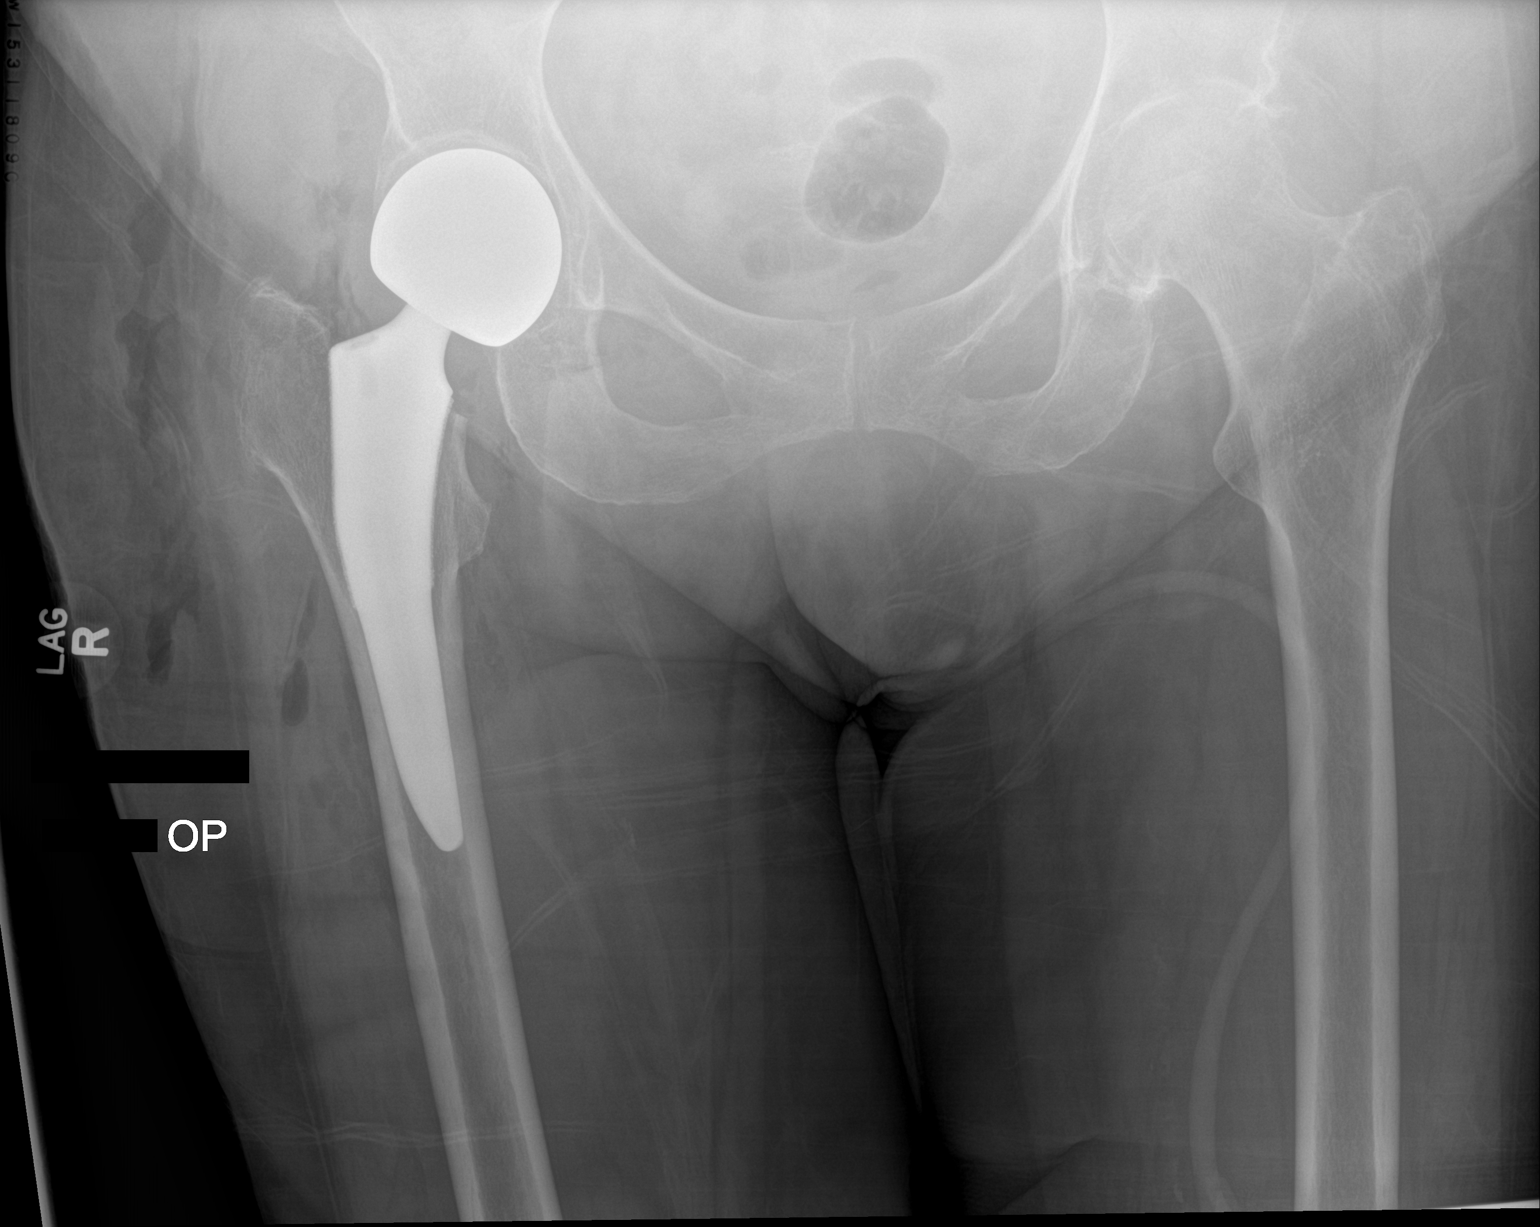

[1 of 1 positions shown; findings below may reference images not displayed]

FINDINGS: Right hip hemiarthroplasty with non cemented component. Components
appear well seated. No evidence of acute fracture or dislocation.
Soft tissue emphysema consistent with recent surgery. Degenerative
changes in the left hip.
IMPRESSION: Right hip hemiarthroplasty.  Components appear well seated.

## 2016-12-07 IMAGING — RF DG HIP (WITH PELVIS) OPERATIVE*R*
1 series · 2 of 2 positions shown · non-contrast
Comparison: 02/16/2015

FLUOROSCOPY TIME:  0 minutes 8 seconds

Images obtained:  2

Exposure:  0.87 mGy

CLINICAL DATA: RIGHT total hip arthroplasty

EXAM:
OPERATIVE RIGHT HIP (WITH PELVIS IF PERFORMED) 2 VIEWS
TECHNIQUE: Fluoroscopic spot image(s) were submitted for interpretation
post-operatively.

[Series 1: run · 2 of 2 slices shown]
[im 1/2]
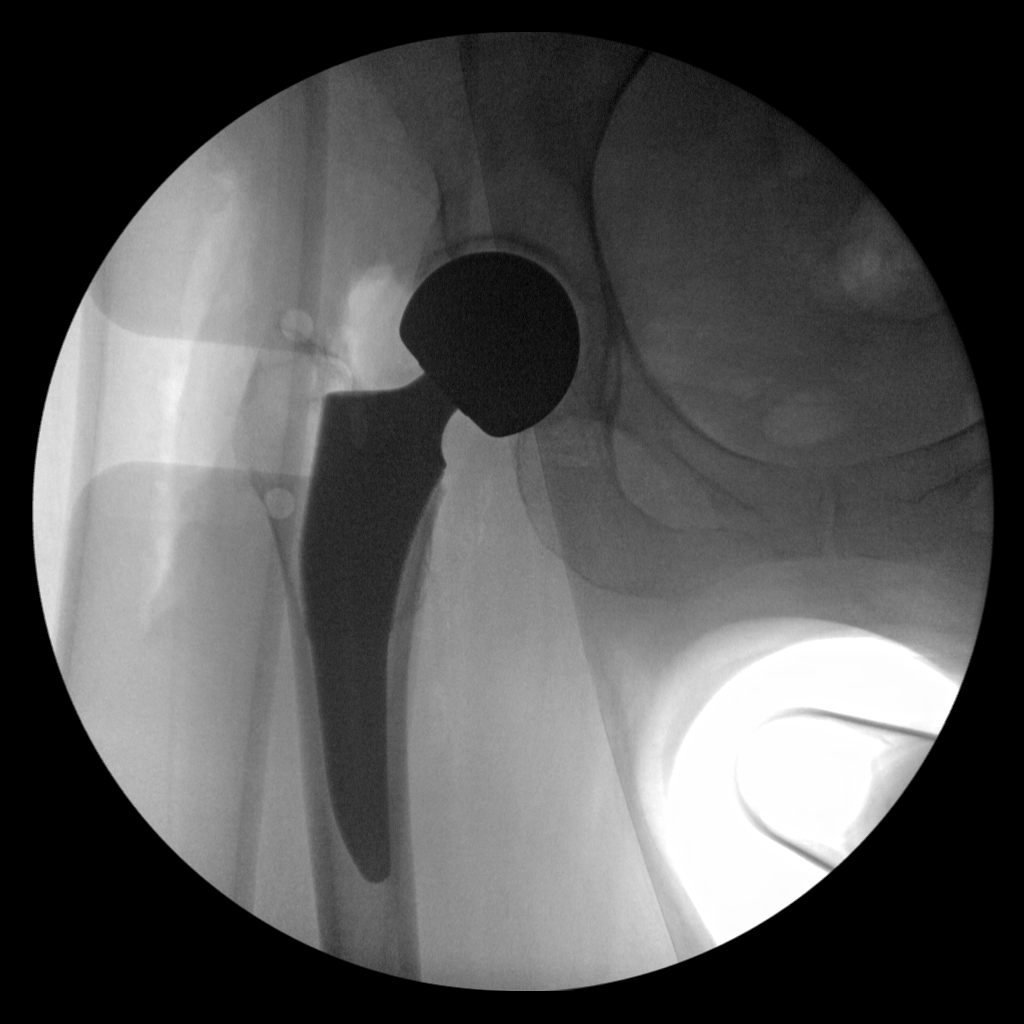
[im 2/2]
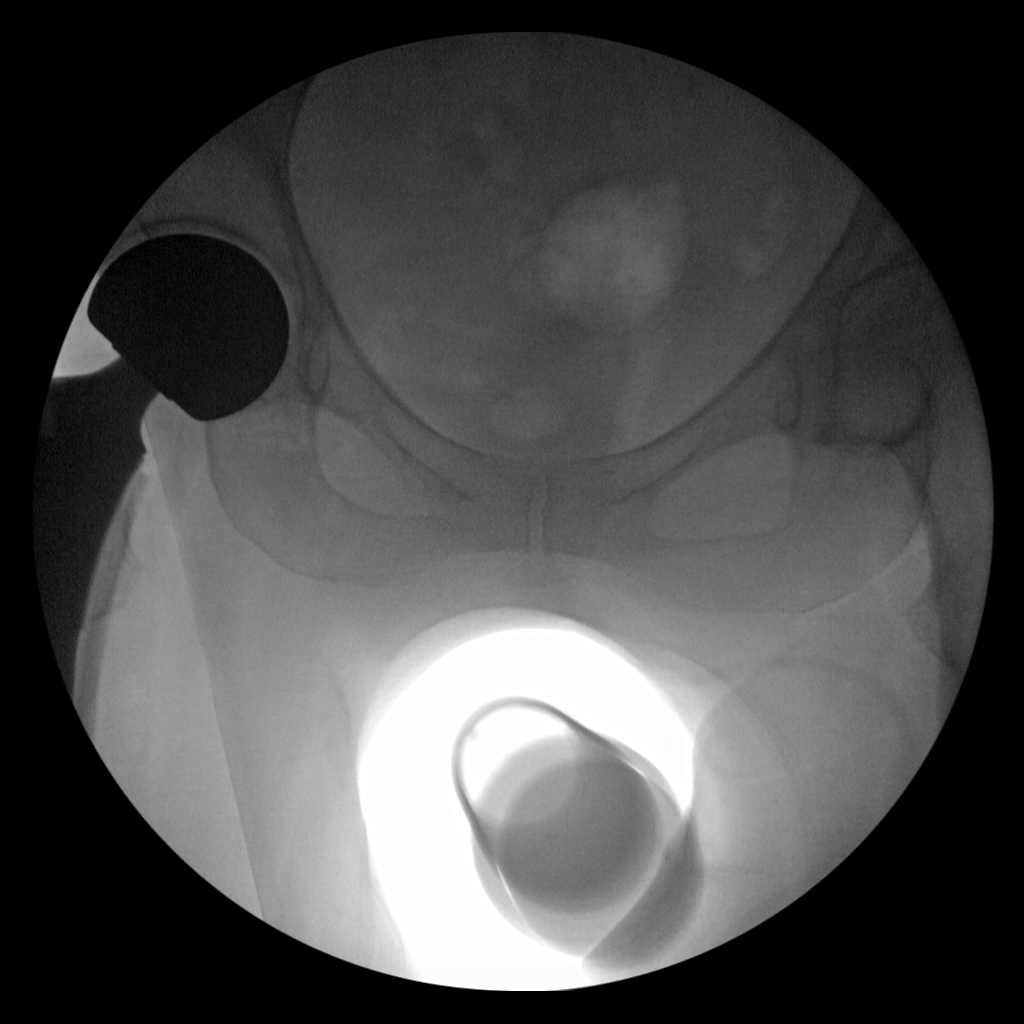

[2 of 2 positions shown; findings below may reference images not displayed]

FINDINGS: RIGHT hip prosthesis identified in expected position.

Bones appear demineralized.

No fracture dislocation seen.
IMPRESSION: RIGHT hip prosthesis without acute fracture or dislocation.

## 2016-12-07 IMAGING — DX DG KNEE 1-2V PORT*R*
2 series · 2 of 2 positions shown · non-contrast
Comparison: 02/06/2015

CLINICAL DATA: Fall 3 days ago.  RIGHT diffuse knee pain

EXAM:
PORTABLE RIGHT KNEE - 1-2 VIEW

[knee ap]
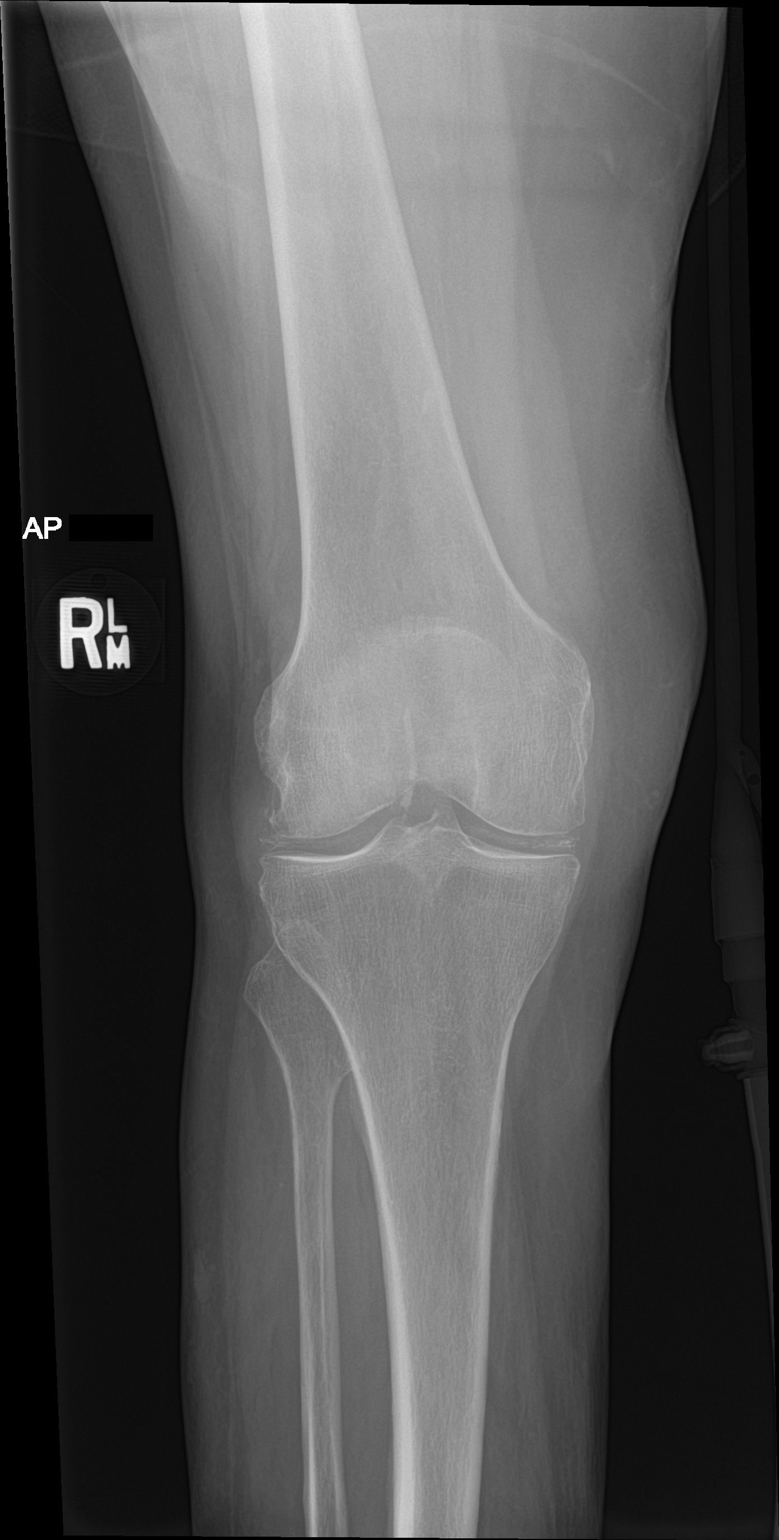

[knee lat]
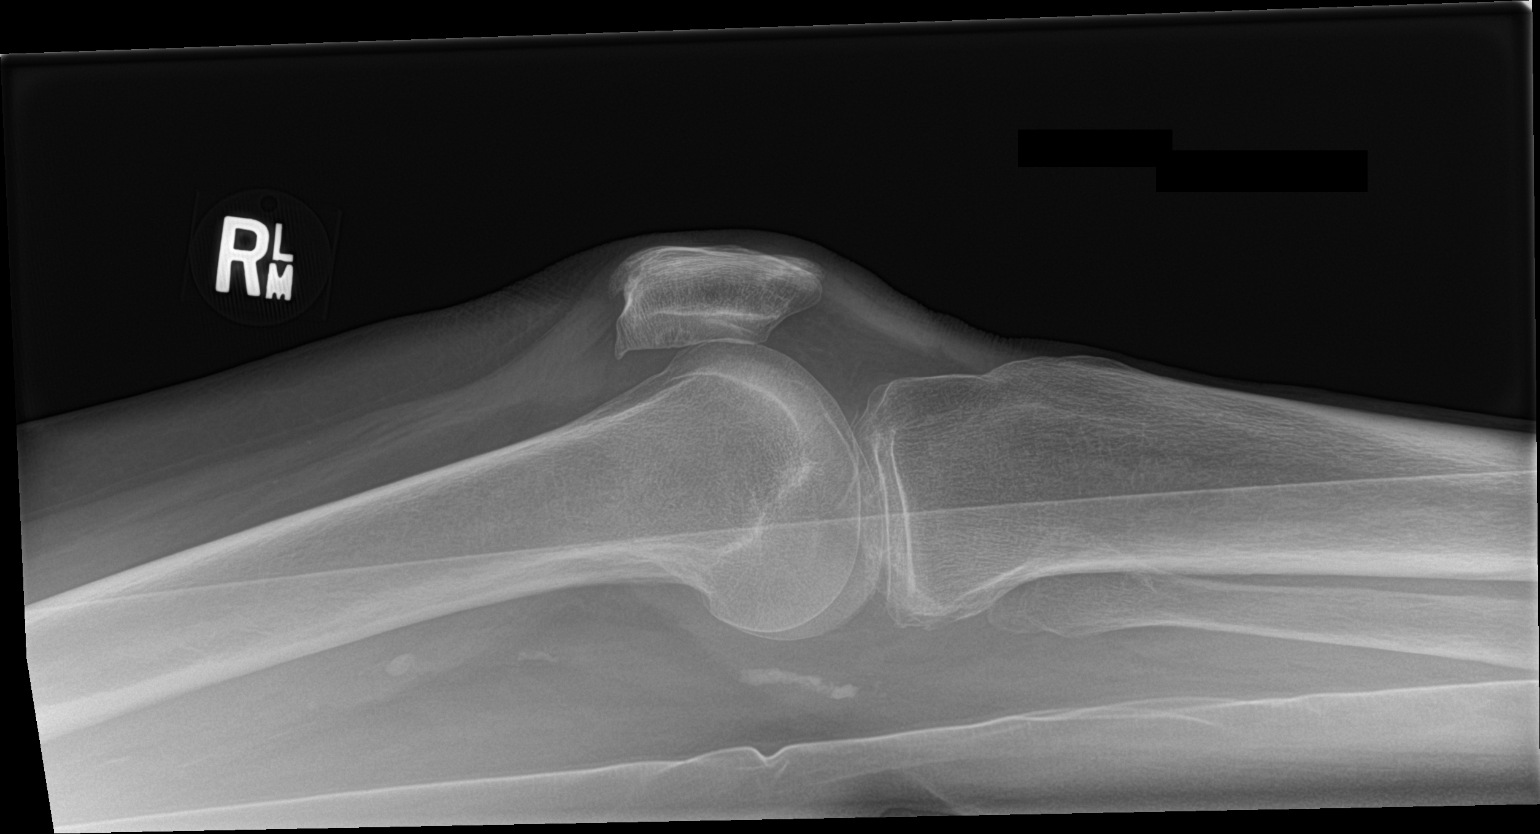

[2 of 2 positions shown; findings below may reference images not displayed]

FINDINGS: No fracture of the proximal tibia or distal femur. Patella is
normal. No joint effusion.

There is and a calcification within the medial lateral compartments
consists with chondrocalcinosis.
IMPRESSION: 1. No acute findings of the RIGHT knee.
2. Chondrocalcinosis, unchanged.

## 2016-12-07 IMAGING — DX DG CHEST 1V PORT
1 series · 1 of 1 positions shown · non-contrast
Comparison: 09/19/2015

CLINICAL DATA: Preop parenchymal

EXAM:
PORTABLE CHEST 1 VIEW

[chest ap]
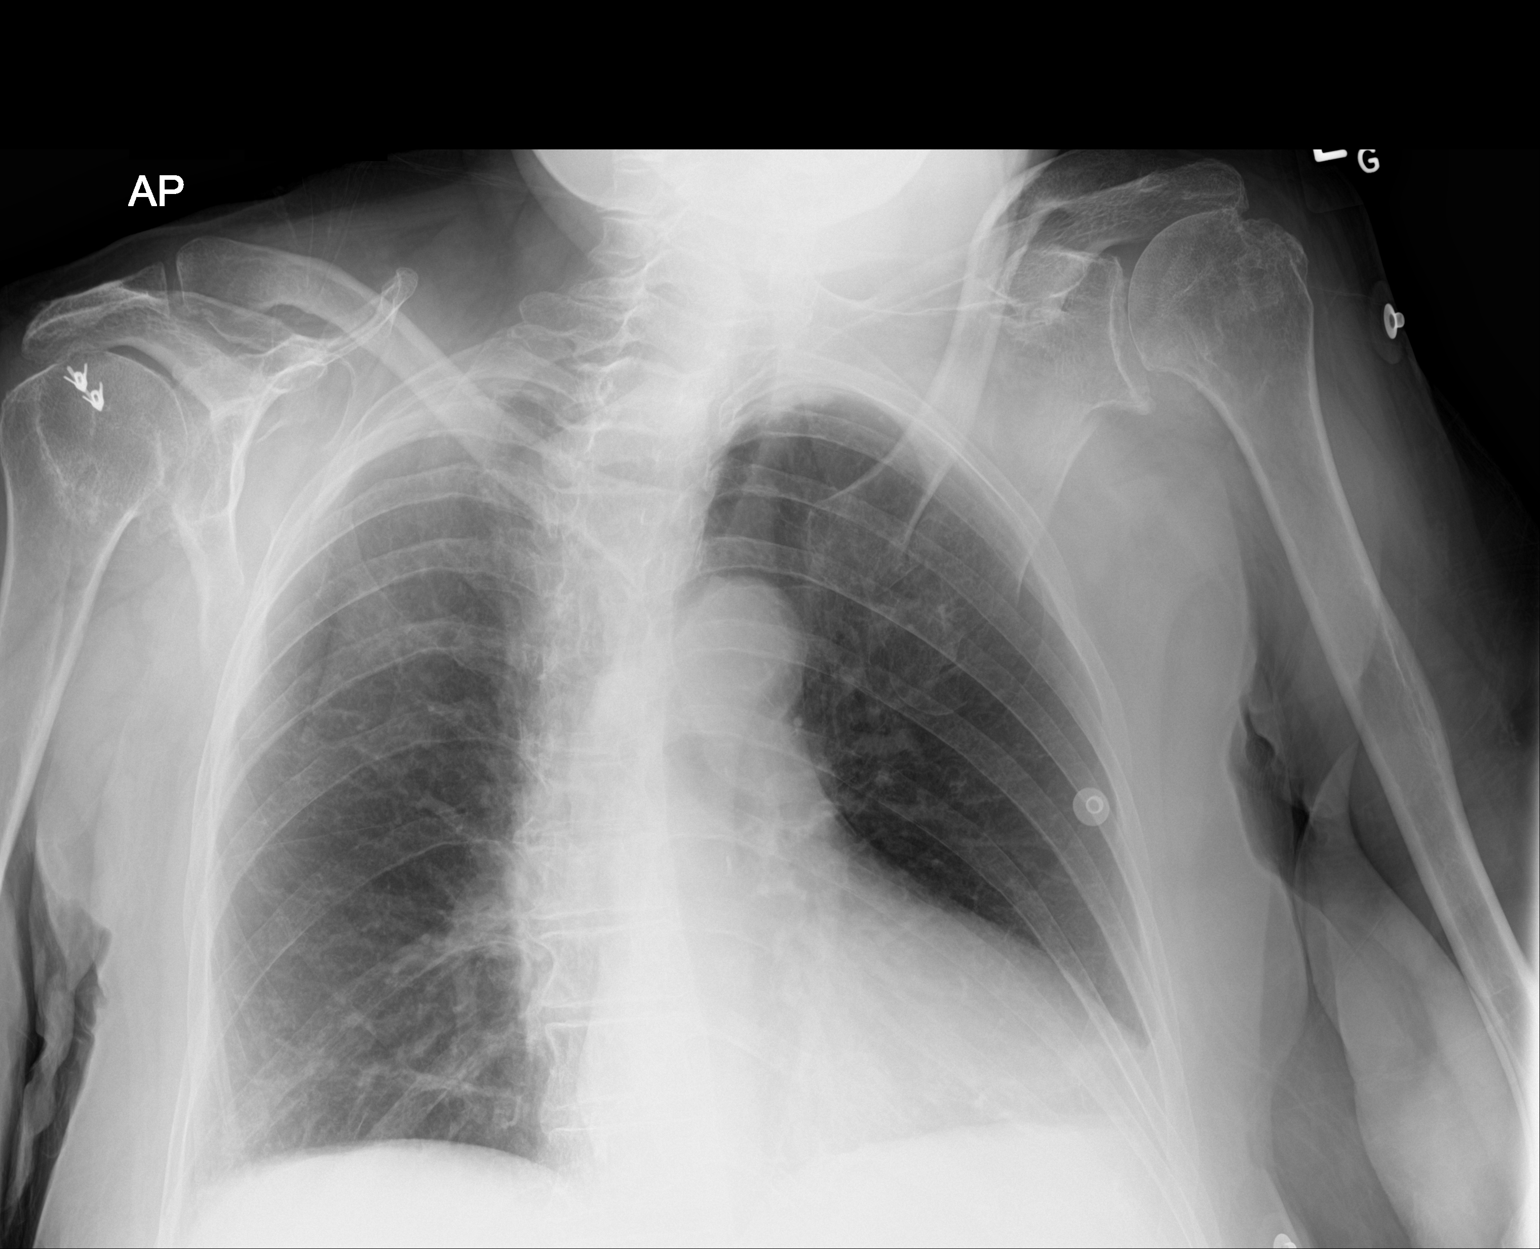

[1 of 1 positions shown; findings below may reference images not displayed]

FINDINGS: Normal cardiac silhouette. Lungs are clear. Scoliosis noted. No
acute osseous abnormality.
IMPRESSION: 1. No acute cardiopulmonary findings.
2. Scoliosis.

## 2016-12-08 DIAGNOSIS — G0430 Acute necrotizing hemorrhagic encephalopathy, unspecified: Secondary | ICD-10-CM | POA: Diagnosis not present

## 2016-12-09 DIAGNOSIS — G0430 Acute necrotizing hemorrhagic encephalopathy, unspecified: Secondary | ICD-10-CM | POA: Diagnosis not present

## 2016-12-10 ENCOUNTER — Emergency Department (HOSPITAL_COMMUNITY): Payer: Medicare HMO

## 2016-12-10 ENCOUNTER — Emergency Department (HOSPITAL_COMMUNITY)
Admission: EM | Admit: 2016-12-10 | Discharge: 2016-12-10 | Disposition: A | Discharge status: Home / Self Care | Payer: Medicare HMO | Attending: Emergency Medicine | Admitting: Emergency Medicine

## 2016-12-10 ENCOUNTER — Encounter (HOSPITAL_COMMUNITY): Payer: Self-pay | Admitting: Nurse Practitioner

## 2016-12-10 DIAGNOSIS — Z87891 Personal history of nicotine dependence: Secondary | ICD-10-CM | POA: Diagnosis not present

## 2016-12-10 DIAGNOSIS — Z7982 Long term (current) use of aspirin: Secondary | ICD-10-CM | POA: Insufficient documentation

## 2016-12-10 DIAGNOSIS — R32 Unspecified urinary incontinence: Secondary | ICD-10-CM | POA: Diagnosis not present

## 2016-12-10 DIAGNOSIS — W19XXXA Unspecified fall, initial encounter: Secondary | ICD-10-CM | POA: Insufficient documentation

## 2016-12-10 DIAGNOSIS — G0430 Acute necrotizing hemorrhagic encephalopathy, unspecified: Secondary | ICD-10-CM | POA: Diagnosis not present

## 2016-12-10 DIAGNOSIS — S0181XA Laceration without foreign body of other part of head, initial encounter: Secondary | ICD-10-CM | POA: Diagnosis not present

## 2016-12-10 DIAGNOSIS — Z79899 Other long term (current) drug therapy: Secondary | ICD-10-CM | POA: Insufficient documentation

## 2016-12-10 DIAGNOSIS — J45909 Unspecified asthma, uncomplicated: Secondary | ICD-10-CM | POA: Diagnosis not present

## 2016-12-10 DIAGNOSIS — Y939 Activity, unspecified: Secondary | ICD-10-CM | POA: Diagnosis not present

## 2016-12-10 DIAGNOSIS — E039 Hypothyroidism, unspecified: Secondary | ICD-10-CM | POA: Insufficient documentation

## 2016-12-10 DIAGNOSIS — Y92129 Unspecified place in nursing home as the place of occurrence of the external cause: Secondary | ICD-10-CM | POA: Insufficient documentation

## 2016-12-10 DIAGNOSIS — Z043 Encounter for examination and observation following other accident: Secondary | ICD-10-CM | POA: Diagnosis not present

## 2016-12-10 DIAGNOSIS — N189 Chronic kidney disease, unspecified: Secondary | ICD-10-CM | POA: Diagnosis not present

## 2016-12-10 DIAGNOSIS — Y999 Unspecified external cause status: Secondary | ICD-10-CM | POA: Diagnosis not present

## 2016-12-10 DIAGNOSIS — Z96641 Presence of right artificial hip joint: Secondary | ICD-10-CM | POA: Insufficient documentation

## 2016-12-10 DIAGNOSIS — F0390 Unspecified dementia without behavioral disturbance: Secondary | ICD-10-CM | POA: Insufficient documentation

## 2016-12-10 DIAGNOSIS — S098XXA Other specified injuries of head, initial encounter: Secondary | ICD-10-CM | POA: Diagnosis not present

## 2016-12-10 DIAGNOSIS — I129 Hypertensive chronic kidney disease with stage 1 through stage 4 chronic kidney disease, or unspecified chronic kidney disease: Secondary | ICD-10-CM | POA: Insufficient documentation

## 2016-12-10 MED ORDER — CEPHALEXIN 500 MG PO CAPS: 500.0000 mg | ORAL_CAPSULE | Freq: Once | ORAL | Status: DC

## 2016-12-10 NOTE — ED Notes (Signed)
Bed: WA19 Expected date:  Expected time:  Means of arrival:  Comments: EMS-fall 

## 2016-12-10 NOTE — Discharge Instructions (Signed)
No abnormalities were found on your exam today.

## 2016-12-10 NOTE — ED Triage Notes (Signed)
Patient had an unwitnessed fall. Stated she fell backwards and hit her head. No LOC. Patient does not take blood thinners.

## 2016-12-10 NOTE — ED Provider Notes (Signed)
[facility]-EMERGENCY DEPT Provider Note   CSN: [id number] Arrival date & time: 12/10/16  1235     History   Chief Complaint No chief complaint on file.   HPI [name] is a 83 y.o. female. Chief complaint is fall  HPI:  83 year old female. Apparently had a witnessed fall today or she fell backwards. Per staff she may have struck her head. She has no complaints. Has not complained of headache. No change in her level of consciousness or demeanor. Has history of dementia. No lacerations or bleeding.  Past Medical History:  Diagnosis Date  . Acute bronchitis 04/10/2013  . Anemia   . Angina   . Anxiety   . Arthritis   . Arthritis 10/02/2012  . Asthma   . Chicken pox   . Chronic kidney disease   . Colon polyps   . Dehydration 08/31/2013  . Dementia   . Depression   . Dysrhythmia   . Headache(784.0)   . Heart murmur   . Hypertension   . Hypothyroidism   . Left leg pain 10/02/2012  . Pneumonia   . Recurrent upper respiratory infection (URI)   . Shortness of breath   . UTI (urinary tract infection) 12/30/2012  . Valvular heart disease 09/04/2012    Patient Active Problem List   Diagnosis Date Noted  . Tibial fracture 09/16/2016  . Left fibular fracture 09/16/2016  . Leukocytosis 09/16/2016  . Pressure ulcer 09/20/2015  . Displaced fracture of right femoral neck (HCC) 09/19/2015  . Altered mental status   . Confusion 06/26/2015  . Fall   . Aspiration pneumonia (HCC) 01/10/2015  . Acute encephalopathy 01/08/2015  . UTI (urinary tract infection) 01/08/2015  . 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 Recurrent falls 11/27/2014  . FTT (failure to thrive) in adult 10/26/2014  . Dehydration 10/26/2014  . Abnormal urinalysis 10/26/2014  . Normocytic anemia 10/26/2014  . Lump of skin of left upper extremity 09/07/2014  . Rash and nonspecific skin eruption 05/08/2014  . Syncope and collapse 11/30/2013  . Low back pain, episodic 11/20/2013  . Chronic kidney  disease   . Insomnia 08/31/2013  . Arthritis 10/02/2012  . Valvular heart disease 09/04/2012  . Restless leg syndrome 06/06/2012  . Trochanteric bursitis 08/02/2011  . Dementia 08/02/2011  . Leg pain 03/28/2011  . Hypertension 09/22/2010  . Hypothyroidism 09/22/2010    Past Surgical History:  Procedure Laterality Date  . ABDOMINAL HYSTERECTOMY    . APPENDECTOMY    . BACK SURGERY    . CHOLECYSTECTOMY    . COLONOSCOPY W/ POLYPECTOMY    . EYE SURGERY     cataract removed and eye lids lifted  . feet surgery    . FRACTURE SURGERY     bilateral arms  . HAND SURGERY    . HIP ARTHROPLASTY Right 09/20/2015   Procedure: ARTHROPLASTY RIGHT  BIPOLAR ANTERIOR HIP (HEMIARTHROPLASTY);  Surgeon: Samson FredericBrian Swinteck, MD;  Location: [facility];  Service: Orthopedics;  Laterality: Right;  . kidney stones    . SHOULDER ARTHROSCOPY    . TONSILLECTOMY    . TONSILLECTOMY    . TUBAL LIGATION      OB History    No data available       Home Medications    Prior to Admission medications   Medication Sig Start Date End Date Taking? Authorizing Provider  acetaminophen (TYLENOL) 500 MG tablet Take 500 mg by mouth every 4 (four) hours as needed for mild pain, moderate pain, fever  or headache.    Yes [provider]  alum & mag hydroxide-simeth (MINTOX) 200-200-20 MG/5ML suspension Take 30 mLs by mouth every 6 (six) hours as needed for indigestion or heartburn.    Yes [provider]  aspirin 81 MG chewable tablet Chew 81 mg by mouth daily with breakfast.    Yes [provider]  clonazePAM (KLONOPIN) 0.5 MG tablet Take 1 tablet (0.5 mg total) by mouth 2 (two) times daily. 09/23/15  Yes Hongalgi, Maximino Greenland, MD  divalproex (DEPAKOTE SPRINKLE) 125 MG capsule Take 125 mg by mouth 3 (three) times daily.    Yes [provider]  escitalopram (LEXAPRO) 5 MG tablet Take 5 mg by mouth daily with breakfast.    Yes [provider]  guaifenesin (ROBITUSSIN) 100 MG/5ML syrup Take  200 mg by mouth every 6 (six) hours as needed for cough.    Yes [provider]  levothyroxine (SYNTHROID, LEVOTHROID) 50 MCG tablet Take 50 mcg by mouth daily before breakfast.    Yes [provider]  loperamide (IMODIUM) 2 MG capsule Take 2 mg by mouth every 3 (three) hours as needed for diarrhea or loose stools.    Yes [provider]  magnesium hydroxide (MILK OF MAGNESIA) 400 MG/5ML suspension Take 30 mLs by mouth at bedtime as needed for mild constipation.   Yes [provider]  Multiple Vitamins-Minerals (MULTIVITAMIN WITH MINERALS) tablet Take 1 tablet by mouth daily with breakfast.    Yes [provider]  Neomycin-Bacitracin-Polymyxin (TRIPLE ANTIBIOTIC) [phone] OINT Apply 1 application topically as needed (for wound care).    Yes [provider]  Nutritional Supplements (NUTRITIONAL DRINK) LIQD Take 1 Bottle by mouth 3 (three) times daily with meals. *Mighty Shakes*   Yes [provider]  rivastigmine (EXELON) 1.5 MG capsule Take 1.5 mg by mouth 2 (two) times daily.   Yes [provider]  senna (SENOKOT) 8.6 MG TABS tablet Take 2 tablets by mouth 2 (two) times daily.   Yes [provider]  traMADol (ULTRAM) 50 MG tablet Take 1 tablet (50 mg total) by mouth every 6 (six) hours as needed for severe pain. 09/18/16  Yes Mikhail, Nita Sells, DO  traZODone (DESYREL) 50 MG tablet Take 50 mg by mouth at bedtime.    Yes [provider]  cephALEXin (KEFLEX) 500 MG capsule Take 1 capsule (500 mg total) by mouth 3 (three) times daily. Patient not taking: Reported on 12/10/2016 10/30/16   Raeford Razor, MD    Family History Family History  Problem Relation Age of Onset  . Heart disease Mother   . Heart disease Father   . Heart disease Other   . Birth defects Other     Social History Social History  Substance Use Topics  . Smoking status: Former Smoker    Packs/day: 0.20    Years: 1.00    Types:  Cigarettes    Quit date: 06/02/1941  . Smokeless tobacco: Never Used  . Alcohol use No     Allergies   Morphine and related; Codeine; and Hydrocodone   Review of Systems Review of Systems  Unable to perform ROS: Dementia     Physical Exam Updated Vital Signs BP (!) 144/96 (BP Location: Right Arm)   Pulse 66   Resp 16   SpO2 99%   Physical Exam  Constitutional: She appears well-developed and well-nourished. No distress.  Dementia evident on exam. Nontender the scalp and skull. No sign of bruising ecchymosis erythema or laceration to  the skull. Nontender the midline or para midline pine or neck. No blood over the TMs mastoids or ears nose or mouth.  HENT:  Head: Normocephalic.  Eyes: Conjunctivae are normal. Pupils are equal, round, and reactive to light. No scleral icterus.  Neck: Normal range of motion. Neck supple. No thyromegaly present.  Cardiovascular: Normal rate and regular rhythm.  Exam reveals no gallop and no friction rub.   No murmur heard. Pulmonary/Chest: Effort normal and breath sounds normal. No respiratory distress. She has no wheezes. She has no rales.  Abdominal: Soft. Bowel sounds are normal. She exhibits no distension. There is no tenderness. There is no rebound.  Musculoskeletal: Normal range of motion.  Neurological: She is alert.  Skin: Skin is warm and dry. No rash noted.  Psychiatric: She has a normal mood and affect. Her behavior is normal.     ED Treatments / Results  Labs (all labs ordered are listed, but only abnormal results are displayed) Labs Reviewed - No data to display  EKG  EKG Interpretation None       Radiology No results found.  Procedures Procedures (including critical care time)  Medications Ordered in ED Medications - No data to display   Initial Impression / Assessment and Plan / ED Course  I have reviewed the triage vital signs and the nursing notes.  Pertinent labs & imaging results that were available during  my care of the patient were reviewed by me and considered in my medical decision making (see chart for details).    Patient is up-to-date with her around the department. CT scans ordered initially. However patient has no sinus tract to head. She's not had an anticoagulant. She is awake and at her baseline. Neurologically intact. We'll discharge back to facility with return precautions  Final Clinical Impressions(s) / ED Diagnoses   Final diagnoses:  Fall, initial encounter    New Prescriptions New Prescriptions   No medications on file     Rolland Porter, MD 12/10/16 1521

## 2016-12-10 NOTE — ED Notes (Signed)
Patient has no complaints. Baseline is confused.

## 2016-12-10 NOTE — ED Notes (Signed)
Called [facility] for transportation back to [facility].

## 2016-12-11 DIAGNOSIS — G0430 Acute necrotizing hemorrhagic encephalopathy, unspecified: Secondary | ICD-10-CM | POA: Diagnosis not present

## 2016-12-12 DIAGNOSIS — G0430 Acute necrotizing hemorrhagic encephalopathy, unspecified: Secondary | ICD-10-CM | POA: Diagnosis not present

## 2016-12-13 DIAGNOSIS — G0430 Acute necrotizing hemorrhagic encephalopathy, unspecified: Secondary | ICD-10-CM | POA: Diagnosis not present

## 2016-12-14 DIAGNOSIS — G0430 Acute necrotizing hemorrhagic encephalopathy, unspecified: Secondary | ICD-10-CM | POA: Diagnosis not present

## 2016-12-15 DIAGNOSIS — R197 Diarrhea, unspecified: Secondary | ICD-10-CM | POA: Diagnosis not present

## 2016-12-15 DIAGNOSIS — R296 Repeated falls: Secondary | ICD-10-CM | POA: Diagnosis not present

## 2016-12-15 DIAGNOSIS — F0281 Dementia in other diseases classified elsewhere with behavioral disturbance: Secondary | ICD-10-CM | POA: Diagnosis not present

## 2016-12-15 DIAGNOSIS — G309 Alzheimer's disease, unspecified: Secondary | ICD-10-CM | POA: Diagnosis not present

## 2016-12-15 DIAGNOSIS — G0430 Acute necrotizing hemorrhagic encephalopathy, unspecified: Secondary | ICD-10-CM | POA: Diagnosis not present

## 2016-12-16 ENCOUNTER — Emergency Department (HOSPITAL_COMMUNITY): Payer: Medicare HMO

## 2016-12-16 ENCOUNTER — Encounter (HOSPITAL_COMMUNITY): Payer: Self-pay | Admitting: Emergency Medicine

## 2016-12-16 ENCOUNTER — Emergency Department (HOSPITAL_COMMUNITY)
Admission: EM | Admit: 2016-12-16 | Discharge: 2016-12-17 | Disposition: A | Discharge status: Home / Self Care | Payer: Medicare HMO | Attending: Emergency Medicine | Admitting: Emergency Medicine

## 2016-12-16 DIAGNOSIS — S199XXA Unspecified injury of neck, initial encounter: Secondary | ICD-10-CM | POA: Diagnosis not present

## 2016-12-16 DIAGNOSIS — N189 Chronic kidney disease, unspecified: Secondary | ICD-10-CM | POA: Insufficient documentation

## 2016-12-16 DIAGNOSIS — Y939 Activity, unspecified: Secondary | ICD-10-CM | POA: Diagnosis not present

## 2016-12-16 DIAGNOSIS — Z7982 Long term (current) use of aspirin: Secondary | ICD-10-CM | POA: Insufficient documentation

## 2016-12-16 DIAGNOSIS — Z79899 Other long term (current) drug therapy: Secondary | ICD-10-CM | POA: Diagnosis not present

## 2016-12-16 DIAGNOSIS — W19XXXA Unspecified fall, initial encounter: Secondary | ICD-10-CM | POA: Insufficient documentation

## 2016-12-16 DIAGNOSIS — S0990XA Unspecified injury of head, initial encounter: Secondary | ICD-10-CM | POA: Diagnosis not present

## 2016-12-16 DIAGNOSIS — S79911A Unspecified injury of right hip, initial encounter: Secondary | ICD-10-CM | POA: Diagnosis not present

## 2016-12-16 DIAGNOSIS — Y999 Unspecified external cause status: Secondary | ICD-10-CM | POA: Diagnosis not present

## 2016-12-16 DIAGNOSIS — Z043 Encounter for examination and observation following other accident: Secondary | ICD-10-CM | POA: Insufficient documentation

## 2016-12-16 DIAGNOSIS — E039 Hypothyroidism, unspecified: Secondary | ICD-10-CM | POA: Diagnosis not present

## 2016-12-16 DIAGNOSIS — M25551 Pain in right hip: Secondary | ICD-10-CM | POA: Diagnosis not present

## 2016-12-16 DIAGNOSIS — T148XXA Other injury of unspecified body region, initial encounter: Secondary | ICD-10-CM | POA: Diagnosis not present

## 2016-12-16 DIAGNOSIS — Z87891 Personal history of nicotine dependence: Secondary | ICD-10-CM | POA: Insufficient documentation

## 2016-12-16 DIAGNOSIS — Z96641 Presence of right artificial hip joint: Secondary | ICD-10-CM | POA: Diagnosis not present

## 2016-12-16 DIAGNOSIS — I129 Hypertensive chronic kidney disease with stage 1 through stage 4 chronic kidney disease, or unspecified chronic kidney disease: Secondary | ICD-10-CM | POA: Diagnosis not present

## 2016-12-16 DIAGNOSIS — Y92129 Unspecified place in nursing home as the place of occurrence of the external cause: Secondary | ICD-10-CM | POA: Insufficient documentation

## 2016-12-16 NOTE — ED Provider Notes (Signed)
[facility]-EMERGENCY DEPT Provider Note   CSN: [id number] Arrival date & time: 12/16/16  2253     History   Chief Complaint Chief Complaint  Patient presents with  . Fall    HPI [name] is a 83 y.o. female.Chief complaint is fall  HPI 83 year old female from a care facility. Seen here 6 days ago after a fall. No apparent injuries. Tonight had a fall and apparently was complaining of some right hip pain. Transferred here for evaluation. Palate was given her nighttime medications. Is somewhat somnolent on arrival. Has history of dementia. Level V caveat applies.  Past Medical History:  Diagnosis Date  . Acute bronchitis 04/10/2013  . Anemia   . Angina   . Anxiety   . Arthritis   . Arthritis 10/02/2012  . Asthma   . Chicken pox   . Chronic kidney disease   . Colon polyps   . Dehydration 08/31/2013  . Dementia   . Depression   . Dysrhythmia   . Headache(784.0)   . Heart murmur   . Hypertension   . Hypothyroidism   . Left leg pain 10/02/2012  . Pneumonia   . Recurrent upper respiratory infection (URI)   . Shortness of breath   . UTI (urinary tract infection) 12/30/2012  . Valvular heart disease 09/04/2012    Patient Active Problem List   Diagnosis Date Noted  . Tibial fracture 09/16/2016  . Left fibular fracture 09/16/2016  . Leukocytosis 09/16/2016  . Pressure ulcer 09/20/2015  . Displaced fracture of right femoral neck (HCC) 09/19/2015  . Altered mental status   . Confusion 06/26/2015  . Fall   . Aspiration pneumonia (HCC) 01/10/2015  . Acute encephalopathy 01/08/2015  . UTI (urinary tract infection) 01/08/2015  . 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 Recurrent falls 11/27/2014  . FTT (failure to thrive) in adult 10/26/2014  . Dehydration 10/26/2014  . Abnormal urinalysis 10/26/2014  . Normocytic anemia 10/26/2014  . Lump of skin of left upper extremity 09/07/2014  . Rash and nonspecific skin eruption 05/08/2014  . Syncope and collapse  11/30/2013  . Low back pain, episodic 11/20/2013  . Chronic kidney disease   . Insomnia 08/31/2013  . Arthritis 10/02/2012  . Valvular heart disease 09/04/2012  . Restless leg syndrome 06/06/2012  . Trochanteric bursitis 08/02/2011  . Dementia 08/02/2011  . Leg pain 03/28/2011  . Hypertension 09/22/2010  . Hypothyroidism 09/22/2010    Past Surgical History:  Procedure Laterality Date  . ABDOMINAL HYSTERECTOMY    . APPENDECTOMY    . BACK SURGERY    . CHOLECYSTECTOMY    . COLONOSCOPY W/ POLYPECTOMY    . EYE SURGERY     cataract removed and eye lids lifted  . feet surgery    . FRACTURE SURGERY     bilateral arms  . HAND SURGERY    . HIP ARTHROPLASTY Right 09/20/2015   Procedure: ARTHROPLASTY RIGHT  BIPOLAR ANTERIOR HIP (HEMIARTHROPLASTY);  Surgeon: Samson Frederic, MD;  Location: [facility];  Service: Orthopedics;  Laterality: Right;  . kidney stones    . SHOULDER ARTHROSCOPY    . TONSILLECTOMY    . TONSILLECTOMY    . TUBAL LIGATION      OB History    No data available       Home Medications    Prior to Admission medications   Medication Sig Start Date End Date Taking? Authorizing Provider  acetaminophen (TYLENOL) 500 MG tablet Take 500 mg by mouth every 4 (  four) hours as needed for mild pain, moderate pain, fever or headache.    Yes [provider]  alum & mag hydroxide-simeth (MINTOX) 200-200-20 MG/5ML suspension Take 30 mLs by mouth every 6 (six) hours as needed for indigestion or heartburn.    Yes [provider]  aspirin 81 MG chewable tablet Chew 81 mg by mouth daily with breakfast.    Yes [provider]  clonazePAM (KLONOPIN) 0.5 MG tablet Take 1 tablet (0.5 mg total) by mouth 2 (two) times daily. 09/23/15  Yes Hongalgi, Maximino Greenland, MD  divalproex (DEPAKOTE SPRINKLE) 125 MG capsule Take 125 mg by mouth 3 (three) times daily.    Yes [provider]  escitalopram (LEXAPRO) 5 MG tablet Take 5 mg by mouth daily with breakfast.    Yes  [provider]  guaifenesin (ROBITUSSIN) 100 MG/5ML syrup Take 200 mg by mouth every 6 (six) hours as needed for cough.    Yes [provider]  levothyroxine (SYNTHROID, LEVOTHROID) 50 MCG tablet Take 50 mcg by mouth daily before breakfast.    Yes [provider]  loperamide (IMODIUM) 2 MG capsule Take 2 mg by mouth every 3 (three) hours as needed for diarrhea or loose stools.    Yes [provider]  magnesium hydroxide (MILK OF MAGNESIA) 400 MG/5ML suspension Take 30 mLs by mouth at bedtime as needed for mild constipation.   Yes [provider]  Multiple Vitamins-Minerals (MULTIVITAMIN WITH MINERALS) tablet Take 1 tablet by mouth daily with breakfast.    Yes [provider]  Neomycin-Bacitracin-Polymyxin (TRIPLE ANTIBIOTIC) [phone] OINT Apply 1 application topically as needed (for wound care).    Yes [provider]  Nutritional Supplements (NUTRITIONAL DRINK) LIQD Take 1 Bottle by mouth 3 (three) times daily with meals. *Mighty Shakes*   Yes [provider]  risperiDONE (RISPERDAL) 0.5 MG tablet Take 1 tablet by mouth daily as needed (anxiety).  12/16/16  Yes [provider]  rivastigmine (EXELON) 1.5 MG capsule Take 1.5 mg by mouth 2 (two) times daily.   Yes [provider]  senna (SENOKOT) 8.6 MG TABS tablet Take 2 tablets by mouth 2 (two) times daily.   Yes [provider]  traMADol (ULTRAM) 50 MG tablet Take 1 tablet (50 mg total) by mouth every 6 (six) hours as needed for severe pain. 09/18/16  Yes Mikhail, Nita Sells, DO  traZODone (DESYREL) 50 MG tablet Take 50 mg by mouth at bedtime.    Yes [provider]  cephALEXin (KEFLEX) 500 MG capsule Take 1 capsule (500 mg total) by mouth 3 (three) times daily. Patient not taking: Reported on 12/10/2016 10/30/16   Raeford Razor, MD    Family History Family History  Problem Relation Age of Onset  . Heart disease Mother   . Heart disease  Father   . Heart disease Other   . Birth defects Other     Social History Social History  Substance Use Topics  . Smoking status: Former Smoker    Packs/day: 0.20    Years: 1.00    Types: Cigarettes    Quit date: 06/02/1941  . Smokeless tobacco: Never Used  . Alcohol use No     Allergies   Morphine and related; Codeine; and Hydrocodone   Review of Systems Review of Systems  Unable to perform ROS: Dementia     Physical Exam Updated Vital Signs BP 129/65 (BP Location: Left Arm)   Pulse 67   Temp 97.6 F (36.4 C) (Oral)  Resp 14   SpO2 97%   Physical Exam  Constitutional: No distress.  Elderly appearing. No distress. Somnolent.  HENT:  Head: Normocephalic.  No bruising, or ecchymosis noted to the head. No blood over TMs, mastoids, or from the ears nose or mouth.  Eyes: Pupils are equal, round, and reactive to light. Conjunctivae are normal. No scleral icterus.  Neck: Normal range of motion. Neck supple. No thyromegaly present.  Cardiovascular: Normal rate and regular rhythm.  Exam reveals no gallop and no friction rub.   No murmur heard. Pulmonary/Chest: Effort normal and breath sounds normal. No respiratory distress. She has no wheezes. She has no rales.  Nontender over the chest. Soft abdomen without tenderness.  Abdominal: Soft. Bowel sounds are normal. She exhibits no distension. There is no tenderness. There is no rebound.  Musculoskeletal: Normal range of motion.  Tenderness over the right hip. Full range of motion of shoulders elbows wrists. No response to flexion extension at the knee. No response to palpation around the ankles.  Skin: Skin is warm and dry. No rash noted.  Psychiatric: She has a normal mood and affect. Her behavior is normal.     ED Treatments / Results  Labs (all labs ordered are listed, but only abnormal results are displayed) Labs Reviewed - No data to display  EKG  EKG Interpretation None       Radiology Ct Head Wo  Contrast  Result Date: 12/17/2016 CLINICAL DATA:  Fall EXAM: CT HEAD WITHOUT CONTRAST CT CERVICAL SPINE WITHOUT CONTRAST TECHNIQUE: Multidetector CT imaging of the head and cervical spine was performed following the standard protocol without intravenous contrast. Multiplanar CT image reconstructions of the cervical spine were also generated. COMPARISON:  None. FINDINGS: CT HEAD FINDINGS Brain: No mass lesion, intraparenchymal hemorrhage or extra-axial collection. No evidence of acute cortical infarct. There is periventricular hypoattenuation compatible with chronic microvascular disease. Vascular: No hyperdense vessel or unexpected calcification. Skull: Normal visualized skull base, calvarium and extracranial soft tissues. Sinuses/Orbits: No sinus fluid levels or advanced mucosal thickening. No mastoid effusion. Normal orbits. CT CERVICAL SPINE FINDINGS Alignment: No static subluxation. Facets are aligned. Occipital condyles are normally positioned. Skull base and vertebrae: No acute fracture. Soft tissues and spinal canal: No prevertebral fluid or swelling. No visible canal hematoma. Disc levels: Moderate right foraminal stenosis at C5-C6. Upper chest: No pneumothorax, pulmonary nodule or pleural effusion. Other: Normal visualized paraspinal cervical soft tissues. IMPRESSION: 1. No acute intracranial abnormality. 2. No acute fracture or static subluxation of the cervical spine. Electronically Signed   By: Deatra RobinsonKevin  Herman M.D.   On: 12/17/2016 00:39   Ct Cervical Spine Wo Contrast  Result Date: 12/17/2016 CLINICAL DATA:  Fall EXAM: CT HEAD WITHOUT CONTRAST CT CERVICAL SPINE WITHOUT CONTRAST TECHNIQUE: Multidetector CT imaging of the head and cervical spine was performed following the standard protocol without intravenous contrast. Multiplanar CT image reconstructions of the cervical spine were also generated. COMPARISON:  None. FINDINGS: CT HEAD FINDINGS Brain: No mass lesion, intraparenchymal hemorrhage or  extra-axial collection. No evidence of acute cortical infarct. There is periventricular hypoattenuation compatible with chronic microvascular disease. Vascular: No hyperdense vessel or unexpected calcification. Skull: Normal visualized skull base, calvarium and extracranial soft tissues. Sinuses/Orbits: No sinus fluid levels or advanced mucosal thickening. No mastoid effusion. Normal orbits. CT CERVICAL SPINE FINDINGS Alignment: No static subluxation. Facets are aligned. Occipital condyles are normally positioned. Skull base and vertebrae: No acute fracture. Soft tissues and spinal canal: No prevertebral fluid or swelling. No visible canal  hematoma. Disc levels: Moderate right foraminal stenosis at C5-C6. Upper chest: No pneumothorax, pulmonary nodule or pleural effusion. Other: Normal visualized paraspinal cervical soft tissues. IMPRESSION: 1. No acute intracranial abnormality. 2. No acute fracture or static subluxation of the cervical spine. Electronically Signed   By: Deatra Robinson M.D.   On: 12/17/2016 00:39   Dg Hip Unilat W Or Wo Pelvis 1 View Right  Result Date: 12/17/2016 CLINICAL DATA:  83 year old female with fall and right hip pain. EXAM: DG HIP (WITH OR WITHOUT PELVIS) 1V RIGHT COMPARISON:  Intraoperative radiograph dated 09/20/2015 FINDINGS: There is no acute fracture or dislocation. Right hip prosthesis appears intact. The bones are osteopenic. There is severe osteoarthritic changes of the left hip. The soft tissues appear unremarkable. A 15 mm rounded slightly more radiopaque density in the right hemipelvis is not well characterized but may represent a partially calcified fibroids or other pelvic lesions/phleboliths. IMPRESSION: 1. No acute fracture or dislocation. 2. Osteopenia with severe osteoarthritic changes of the left hip. Electronically Signed   By: Elgie Collard M.D.   On: 12/17/2016 00:40    Procedures Procedures (including critical care time)  Medications Ordered in  ED Medications - No data to display   Initial Impression / Assessment and Plan / ED Course  I have reviewed the triage vital signs and the nursing notes.  Pertinent labs & imaging results that were available during my care of the patient were reviewed by me and considered in my medical decision making (see chart for details).    Limited history and exam due to dementia and sedation. Plain CT of head and neck. Pelvis and right hip films. Will reevaluate.  Final Clinical Impressions(s) / ED Diagnoses   Final diagnoses:  Fall, initial encounter    CT of head and neck, as well as x-ray of hip and pelvis are normal. Available to move her through range of motion of bilateral hips knees and ankles shoulders elbows and wrists. No tenderness to palpation in the abdomen or chest. No signs of strike to the head bruising or ecchymosis. Appropriate for discharge back to her facility. Recheck with any ongoing difficulties. New Prescriptions New Prescriptions   No medications on file     Rolland Porter, MD 12/17/16 [phone]

## 2016-12-16 NOTE — ED Notes (Signed)
Bed: [id number] Expected date:  Expected time:  Means of arrival:  Comments: EMS 83 yo female fall-from SNF-no blood thinners

## 2016-12-16 NOTE — ED Triage Notes (Signed)
Pt comes to ed, via ems, c/o fall witness, pain to Right hip. Fall 4 hr ago. Staff noticed bruises to side later 1 hr ago.  Pt is currently had her night dose of meds and is hard to awake. Pt has dementia at baseline. Pt comes from [facility].. DNR v/s; are 134/51, pulse 62, spo2 96 room air, rr16.  HTN, dementia.

## 2016-12-17 DIAGNOSIS — J189 Pneumonia, unspecified organism: Secondary | ICD-10-CM | POA: Diagnosis not present

## 2016-12-17 DIAGNOSIS — S79911A Unspecified injury of right hip, initial encounter: Secondary | ICD-10-CM | POA: Diagnosis not present

## 2016-12-17 DIAGNOSIS — G0430 Acute necrotizing hemorrhagic encephalopathy, unspecified: Secondary | ICD-10-CM | POA: Diagnosis not present

## 2016-12-17 NOTE — Discharge Instructions (Signed)
No significant injuries were found during her evaluation tonight.  Return to emergency room as needed for any ongoing difficulties.

## 2016-12-18 DIAGNOSIS — R278 Other lack of coordination: Secondary | ICD-10-CM | POA: Diagnosis not present

## 2016-12-18 DIAGNOSIS — M6281 Muscle weakness (generalized): Secondary | ICD-10-CM | POA: Diagnosis not present

## 2016-12-18 DIAGNOSIS — G0430 Acute necrotizing hemorrhagic encephalopathy, unspecified: Secondary | ICD-10-CM | POA: Diagnosis not present

## 2016-12-18 DIAGNOSIS — Z9181 History of falling: Secondary | ICD-10-CM | POA: Diagnosis not present

## 2016-12-19 DIAGNOSIS — G0430 Acute necrotizing hemorrhagic encephalopathy, unspecified: Secondary | ICD-10-CM | POA: Diagnosis not present

## 2016-12-20 DIAGNOSIS — Z9181 History of falling: Secondary | ICD-10-CM | POA: Diagnosis not present

## 2016-12-20 DIAGNOSIS — G0430 Acute necrotizing hemorrhagic encephalopathy, unspecified: Secondary | ICD-10-CM | POA: Diagnosis not present

## 2016-12-20 DIAGNOSIS — M6281 Muscle weakness (generalized): Secondary | ICD-10-CM | POA: Diagnosis not present

## 2016-12-20 DIAGNOSIS — R278 Other lack of coordination: Secondary | ICD-10-CM | POA: Diagnosis not present

## 2016-12-21 DIAGNOSIS — G0430 Acute necrotizing hemorrhagic encephalopathy, unspecified: Secondary | ICD-10-CM | POA: Diagnosis not present

## 2016-12-22 DIAGNOSIS — G309 Alzheimer's disease, unspecified: Secondary | ICD-10-CM | POA: Diagnosis not present

## 2016-12-22 DIAGNOSIS — G0430 Acute necrotizing hemorrhagic encephalopathy, unspecified: Secondary | ICD-10-CM | POA: Diagnosis not present

## 2016-12-22 DIAGNOSIS — F0281 Dementia in other diseases classified elsewhere with behavioral disturbance: Secondary | ICD-10-CM | POA: Diagnosis not present

## 2016-12-22 DIAGNOSIS — R296 Repeated falls: Secondary | ICD-10-CM | POA: Diagnosis not present

## 2016-12-23 DIAGNOSIS — G0430 Acute necrotizing hemorrhagic encephalopathy, unspecified: Secondary | ICD-10-CM | POA: Diagnosis not present

## 2016-12-24 DIAGNOSIS — G0430 Acute necrotizing hemorrhagic encephalopathy, unspecified: Secondary | ICD-10-CM | POA: Diagnosis not present

## 2016-12-24 DIAGNOSIS — R278 Other lack of coordination: Secondary | ICD-10-CM | POA: Diagnosis not present

## 2016-12-24 DIAGNOSIS — Z9181 History of falling: Secondary | ICD-10-CM | POA: Diagnosis not present

## 2016-12-24 DIAGNOSIS — M6281 Muscle weakness (generalized): Secondary | ICD-10-CM | POA: Diagnosis not present

## 2016-12-25 DIAGNOSIS — G0430 Acute necrotizing hemorrhagic encephalopathy, unspecified: Secondary | ICD-10-CM | POA: Diagnosis not present

## 2016-12-26 DIAGNOSIS — G0430 Acute necrotizing hemorrhagic encephalopathy, unspecified: Secondary | ICD-10-CM | POA: Diagnosis not present

## 2016-12-27 DIAGNOSIS — G0430 Acute necrotizing hemorrhagic encephalopathy, unspecified: Secondary | ICD-10-CM | POA: Diagnosis not present

## 2016-12-28 DIAGNOSIS — G0430 Acute necrotizing hemorrhagic encephalopathy, unspecified: Secondary | ICD-10-CM | POA: Diagnosis not present

## 2016-12-29 DIAGNOSIS — G0430 Acute necrotizing hemorrhagic encephalopathy, unspecified: Secondary | ICD-10-CM | POA: Diagnosis not present

## 2016-12-29 DIAGNOSIS — R296 Repeated falls: Secondary | ICD-10-CM | POA: Diagnosis not present

## 2016-12-29 DIAGNOSIS — F0281 Dementia in other diseases classified elsewhere with behavioral disturbance: Secondary | ICD-10-CM | POA: Diagnosis not present

## 2016-12-29 DIAGNOSIS — A084 Viral intestinal infection, unspecified: Secondary | ICD-10-CM | POA: Diagnosis not present

## 2016-12-29 DIAGNOSIS — G309 Alzheimer's disease, unspecified: Secondary | ICD-10-CM | POA: Diagnosis not present

## 2016-12-30 DIAGNOSIS — G0430 Acute necrotizing hemorrhagic encephalopathy, unspecified: Secondary | ICD-10-CM | POA: Diagnosis not present

## 2016-12-31 DIAGNOSIS — G0430 Acute necrotizing hemorrhagic encephalopathy, unspecified: Secondary | ICD-10-CM | POA: Diagnosis not present

## 2017-01-01 DIAGNOSIS — G0430 Acute necrotizing hemorrhagic encephalopathy, unspecified: Secondary | ICD-10-CM | POA: Diagnosis not present

## 2017-01-02 DIAGNOSIS — G0430 Acute necrotizing hemorrhagic encephalopathy, unspecified: Secondary | ICD-10-CM | POA: Diagnosis not present

## 2017-01-03 DIAGNOSIS — G0430 Acute necrotizing hemorrhagic encephalopathy, unspecified: Secondary | ICD-10-CM | POA: Diagnosis not present

## 2017-01-04 DIAGNOSIS — G0430 Acute necrotizing hemorrhagic encephalopathy, unspecified: Secondary | ICD-10-CM | POA: Diagnosis not present

## 2017-01-05 DIAGNOSIS — M6281 Muscle weakness (generalized): Secondary | ICD-10-CM | POA: Diagnosis not present

## 2017-01-05 DIAGNOSIS — R278 Other lack of coordination: Secondary | ICD-10-CM | POA: Diagnosis not present

## 2017-01-05 DIAGNOSIS — Z9181 History of falling: Secondary | ICD-10-CM | POA: Diagnosis not present

## 2017-01-05 DIAGNOSIS — G0430 Acute necrotizing hemorrhagic encephalopathy, unspecified: Secondary | ICD-10-CM | POA: Diagnosis not present

## 2017-01-06 DIAGNOSIS — G0430 Acute necrotizing hemorrhagic encephalopathy, unspecified: Secondary | ICD-10-CM | POA: Diagnosis not present

## 2017-01-07 DIAGNOSIS — M6281 Muscle weakness (generalized): Secondary | ICD-10-CM | POA: Diagnosis not present

## 2017-01-07 DIAGNOSIS — Z9181 History of falling: Secondary | ICD-10-CM | POA: Diagnosis not present

## 2017-01-07 DIAGNOSIS — R278 Other lack of coordination: Secondary | ICD-10-CM | POA: Diagnosis not present

## 2017-01-07 DIAGNOSIS — G0430 Acute necrotizing hemorrhagic encephalopathy, unspecified: Secondary | ICD-10-CM | POA: Diagnosis not present

## 2017-01-08 DIAGNOSIS — M6281 Muscle weakness (generalized): Secondary | ICD-10-CM | POA: Diagnosis not present

## 2017-01-08 DIAGNOSIS — R278 Other lack of coordination: Secondary | ICD-10-CM | POA: Diagnosis not present

## 2017-01-08 DIAGNOSIS — R32 Unspecified urinary incontinence: Secondary | ICD-10-CM | POA: Diagnosis not present

## 2017-01-08 DIAGNOSIS — Z9181 History of falling: Secondary | ICD-10-CM | POA: Diagnosis not present

## 2017-01-08 DIAGNOSIS — G0430 Acute necrotizing hemorrhagic encephalopathy, unspecified: Secondary | ICD-10-CM | POA: Diagnosis not present

## 2017-01-09 DIAGNOSIS — G0430 Acute necrotizing hemorrhagic encephalopathy, unspecified: Secondary | ICD-10-CM | POA: Diagnosis not present

## 2017-01-10 DIAGNOSIS — G0430 Acute necrotizing hemorrhagic encephalopathy, unspecified: Secondary | ICD-10-CM | POA: Diagnosis not present

## 2017-01-11 DIAGNOSIS — G0430 Acute necrotizing hemorrhagic encephalopathy, unspecified: Secondary | ICD-10-CM | POA: Diagnosis not present

## 2017-01-12 DIAGNOSIS — G309 Alzheimer's disease, unspecified: Secondary | ICD-10-CM | POA: Diagnosis not present

## 2017-01-12 DIAGNOSIS — F0281 Dementia in other diseases classified elsewhere with behavioral disturbance: Secondary | ICD-10-CM | POA: Diagnosis not present

## 2017-01-12 DIAGNOSIS — R296 Repeated falls: Secondary | ICD-10-CM | POA: Diagnosis not present

## 2017-01-12 DIAGNOSIS — G0430 Acute necrotizing hemorrhagic encephalopathy, unspecified: Secondary | ICD-10-CM | POA: Diagnosis not present

## 2017-01-13 DIAGNOSIS — M6281 Muscle weakness (generalized): Secondary | ICD-10-CM | POA: Diagnosis not present

## 2017-01-13 DIAGNOSIS — R278 Other lack of coordination: Secondary | ICD-10-CM | POA: Diagnosis not present

## 2017-01-13 DIAGNOSIS — Z9181 History of falling: Secondary | ICD-10-CM | POA: Diagnosis not present

## 2017-01-13 DIAGNOSIS — G0430 Acute necrotizing hemorrhagic encephalopathy, unspecified: Secondary | ICD-10-CM | POA: Diagnosis not present

## 2017-01-14 DIAGNOSIS — G0430 Acute necrotizing hemorrhagic encephalopathy, unspecified: Secondary | ICD-10-CM | POA: Diagnosis not present

## 2017-01-15 DIAGNOSIS — R278 Other lack of coordination: Secondary | ICD-10-CM | POA: Diagnosis not present

## 2017-01-15 DIAGNOSIS — Z9181 History of falling: Secondary | ICD-10-CM | POA: Diagnosis not present

## 2017-01-15 DIAGNOSIS — G0430 Acute necrotizing hemorrhagic encephalopathy, unspecified: Secondary | ICD-10-CM | POA: Diagnosis not present

## 2017-01-15 DIAGNOSIS — M6281 Muscle weakness (generalized): Secondary | ICD-10-CM | POA: Diagnosis not present

## 2017-01-16 DIAGNOSIS — R278 Other lack of coordination: Secondary | ICD-10-CM | POA: Diagnosis not present

## 2017-01-16 DIAGNOSIS — M6281 Muscle weakness (generalized): Secondary | ICD-10-CM | POA: Diagnosis not present

## 2017-01-16 DIAGNOSIS — Z9181 History of falling: Secondary | ICD-10-CM | POA: Diagnosis not present

## 2017-01-16 DIAGNOSIS — G0430 Acute necrotizing hemorrhagic encephalopathy, unspecified: Secondary | ICD-10-CM | POA: Diagnosis not present

## 2017-01-17 DIAGNOSIS — G0430 Acute necrotizing hemorrhagic encephalopathy, unspecified: Secondary | ICD-10-CM | POA: Diagnosis not present

## 2017-01-18 DIAGNOSIS — G0430 Acute necrotizing hemorrhagic encephalopathy, unspecified: Secondary | ICD-10-CM | POA: Diagnosis not present

## 2017-01-19 DIAGNOSIS — F411 Generalized anxiety disorder: Secondary | ICD-10-CM | POA: Diagnosis not present

## 2017-01-19 DIAGNOSIS — F330 Major depressive disorder, recurrent, mild: Secondary | ICD-10-CM | POA: Diagnosis not present

## 2017-01-19 DIAGNOSIS — F0280 Dementia in other diseases classified elsewhere without behavioral disturbance: Secondary | ICD-10-CM | POA: Diagnosis not present

## 2017-01-19 DIAGNOSIS — G309 Alzheimer's disease, unspecified: Secondary | ICD-10-CM | POA: Diagnosis not present

## 2017-01-19 DIAGNOSIS — G0430 Acute necrotizing hemorrhagic encephalopathy, unspecified: Secondary | ICD-10-CM | POA: Diagnosis not present

## 2017-01-20 DIAGNOSIS — M6281 Muscle weakness (generalized): Secondary | ICD-10-CM | POA: Diagnosis not present

## 2017-01-20 DIAGNOSIS — R278 Other lack of coordination: Secondary | ICD-10-CM | POA: Diagnosis not present

## 2017-01-20 DIAGNOSIS — G0430 Acute necrotizing hemorrhagic encephalopathy, unspecified: Secondary | ICD-10-CM | POA: Diagnosis not present

## 2017-01-20 DIAGNOSIS — Z9181 History of falling: Secondary | ICD-10-CM | POA: Diagnosis not present

## 2017-01-21 DIAGNOSIS — G0430 Acute necrotizing hemorrhagic encephalopathy, unspecified: Secondary | ICD-10-CM | POA: Diagnosis not present

## 2017-01-22 DIAGNOSIS — G0430 Acute necrotizing hemorrhagic encephalopathy, unspecified: Secondary | ICD-10-CM | POA: Diagnosis not present

## 2017-01-23 ENCOUNTER — Emergency Department (HOSPITAL_COMMUNITY)
Admission: EM | Admit: 2017-01-23 | Discharge: 2017-01-24 | Disposition: A | Discharge status: Home / Self Care | Payer: Medicare HMO | Attending: Emergency Medicine | Admitting: Emergency Medicine

## 2017-01-23 ENCOUNTER — Encounter (HOSPITAL_COMMUNITY): Payer: Self-pay

## 2017-01-23 ENCOUNTER — Emergency Department (HOSPITAL_COMMUNITY): Payer: Medicare HMO

## 2017-01-23 DIAGNOSIS — R05 Cough: Secondary | ICD-10-CM | POA: Diagnosis not present

## 2017-01-23 DIAGNOSIS — I10 Essential (primary) hypertension: Secondary | ICD-10-CM | POA: Diagnosis not present

## 2017-01-23 DIAGNOSIS — J45909 Unspecified asthma, uncomplicated: Secondary | ICD-10-CM | POA: Insufficient documentation

## 2017-01-23 DIAGNOSIS — R410 Disorientation, unspecified: Secondary | ICD-10-CM | POA: Insufficient documentation

## 2017-01-23 DIAGNOSIS — Z7982 Long term (current) use of aspirin: Secondary | ICD-10-CM | POA: Diagnosis not present

## 2017-01-23 DIAGNOSIS — Z87891 Personal history of nicotine dependence: Secondary | ICD-10-CM | POA: Insufficient documentation

## 2017-01-23 DIAGNOSIS — E039 Hypothyroidism, unspecified: Secondary | ICD-10-CM | POA: Insufficient documentation

## 2017-01-23 DIAGNOSIS — F0390 Unspecified dementia without behavioral disturbance: Secondary | ICD-10-CM | POA: Insufficient documentation

## 2017-01-23 DIAGNOSIS — R079 Chest pain, unspecified: Secondary | ICD-10-CM | POA: Diagnosis not present

## 2017-01-23 DIAGNOSIS — D649 Anemia, unspecified: Secondary | ICD-10-CM | POA: Insufficient documentation

## 2017-01-23 DIAGNOSIS — Z79899 Other long term (current) drug therapy: Secondary | ICD-10-CM | POA: Diagnosis not present

## 2017-01-23 DIAGNOSIS — R0789 Other chest pain: Secondary | ICD-10-CM | POA: Insufficient documentation

## 2017-01-23 LAB — URINALYSIS, ROUTINE W REFLEX MICROSCOPIC
Bacteria, UA: NONE SEEN
Bilirubin Urine: NEGATIVE
Glucose, UA: NEGATIVE mg/dL
Hgb urine dipstick: NEGATIVE
Ketones, ur: NEGATIVE mg/dL
NITRITE: NEGATIVE
Protein, ur: NEGATIVE mg/dL
SPECIFIC GRAVITY, URINE: 1.02 (ref 1.005–1.030)
pH: 5 (ref 5.0–8.0)

## 2017-01-23 LAB — COMPREHENSIVE METABOLIC PANEL
ALT: 15 U/L (ref 14–54)
AST: 19 U/L (ref 15–41)
Albumin: 3.3 g/dL — ABNORMAL LOW (ref 3.5–5.0)
Alkaline Phosphatase: 79 U/L (ref 38–126)
Anion gap: 8 (ref 5–15)
BILIRUBIN TOTAL: 0.3 mg/dL (ref 0.3–1.2)
BUN: 18 mg/dL (ref 6–20)
CALCIUM: 9.4 mg/dL (ref 8.9–10.3)
CO2: 29 mmol/L (ref 22–32)
Chloride: 107 mmol/L (ref 101–111)
Creatinine, Ser: 1.01 mg/dL — ABNORMAL HIGH (ref 0.44–1.00)
GFR calc non Af Amer: 46 mL/min — ABNORMAL LOW (ref >60)
GFR, EST AFRICAN AMERICAN: 53 mL/min — AB (ref >60)
Glucose, Bld: 128 mg/dL — ABNORMAL HIGH (ref 65–99)
Potassium: 4.5 mmol/L (ref 3.5–5.1)
Sodium: 144 mmol/L (ref 135–145)
TOTAL PROTEIN: 6.5 g/dL (ref 6.5–8.1)

## 2017-01-23 LAB — I-STAT TROPONIN, ED: TROPONIN I, POC: 0 ng/mL (ref 0.00–0.08)

## 2017-01-23 LAB — CBC
HCT: 37.2 % (ref 36.0–46.0)
HEMOGLOBIN: 12.1 g/dL (ref 12.0–15.0)
MCH: 28.4 pg (ref 26.0–34.0)
MCHC: 32.5 g/dL (ref 30.0–36.0)
MCV: 87.3 fL (ref 78.0–100.0)
PLATELETS: 242 10*3/uL (ref 150–400)
RBC: 4.26 MIL/uL (ref 3.87–5.11)
RDW: 13.4 % (ref 11.5–15.5)
WBC: 10.1 10*3/uL (ref 4.0–10.5)

## 2017-01-23 LAB — I-STAT CG4 LACTIC ACID, ED: Lactic Acid, Venous: 1.64 mmol/L (ref 0.5–1.9)

## 2017-01-23 NOTE — ED Triage Notes (Signed)
Pt arrived via [facility] from [facility].  Staff states pt coughed while taking her night time meds (trazodone, Klonopin) and held chest when she coughed.  Denies pain with EMS.  Sleeping on arrival.  EMS did give 81mg  ASA pt refused rest.

## 2017-01-23 NOTE — ED Provider Notes (Signed)
[facility]-EMERGENCY DEPT Provider Note   CSN: [id number] Arrival date & time: 01/23/17  2128     History   Chief Complaint Chief Complaint  Patient presents with  . Chest Pain    HPI [name] is a 83 y.o. female.  HPI [name] is a 83 y.o. female with hx of dementia, anxiety, htn, hypothyroidism, UTIs, presents to ED with complaint of chest pain and body aches. Pt is from [facility]. According to the staff whom I spoke with, the patient was asleep and woke up complaining of chest pain and body aches. Patient seemed unusually confused when she woke up, and was unable to carry a normal conversation with staff or tell them her name which she normally is able to do. She apparently also woke up and jumped out of the bed and thought that there was a fire. Patient is currently complaining of pain "everywhere " but states "it is not too bad." She is very confused and unable to provide much history. Patient's baseline is she is able to ambulate small amount with a walker, but normally uses wheelchair, she is normally alert, able to carry on a conversation, able to say her name, otherwise confused.  Past Medical History:  Diagnosis Date  . Acute bronchitis 04/10/2013  . Anemia   . Angina   . Anxiety   . Arthritis   . Arthritis 10/02/2012  . Asthma   . Chicken pox   . Chronic kidney disease   . Colon polyps   . Dehydration 08/31/2013  . Dementia   . Depression   . Dysrhythmia   . Headache(784.0)   . Heart murmur   . Hypertension   . Hypothyroidism   . Left leg pain 10/02/2012  . Pneumonia   . Recurrent upper respiratory infection (URI)   . Shortness of breath   . UTI (urinary tract infection) 12/30/2012  . Valvular heart disease 09/04/2012    Patient Active Problem List   Diagnosis Date Noted  . Tibial fracture 09/16/2016  . Left fibular fracture 09/16/2016  . Leukocytosis 09/16/2016  . Pressure ulcer 09/20/2015  . Displaced fracture of right  femoral neck (HCC) 09/19/2015  . Altered mental status   . Confusion 06/26/2015  . Fall   . Aspiration pneumonia (HCC) 01/10/2015  . Acute encephalopathy 01/08/2015  . UTI (urinary tract infection) 01/08/2015  . 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 Recurrent falls 11/27/2014  . FTT (failure to thrive) in adult 10/26/2014  . Dehydration 10/26/2014  . Abnormal urinalysis 10/26/2014  . Normocytic anemia 10/26/2014  . Lump of skin of left upper extremity 09/07/2014  . Rash and nonspecific skin eruption 05/08/2014  . Syncope and collapse 11/30/2013  . Low back pain, episodic 11/20/2013  . Chronic kidney disease   . Insomnia 08/31/2013  . Arthritis 10/02/2012  . Valvular heart disease 09/04/2012  . Restless leg syndrome 06/06/2012  . Trochanteric bursitis 08/02/2011  . Dementia 08/02/2011  . Leg pain 03/28/2011  . Hypertension 09/22/2010  . Hypothyroidism 09/22/2010    Past Surgical History:  Procedure Laterality Date  . ABDOMINAL HYSTERECTOMY    . APPENDECTOMY    . BACK SURGERY    . CHOLECYSTECTOMY    . COLONOSCOPY W/ POLYPECTOMY    . EYE SURGERY     cataract removed and eye lids lifted  . feet surgery    . FRACTURE SURGERY     bilateral arms  . HAND SURGERY    .  HIP ARTHROPLASTY Right 09/20/2015   Procedure: ARTHROPLASTY RIGHT  BIPOLAR ANTERIOR HIP (HEMIARTHROPLASTY);  Surgeon: Samson Frederic, MD;  Location: [facility] ORS;  Service: Orthopedics;  Laterality: Right;  . kidney stones    . SHOULDER ARTHROSCOPY    . TONSILLECTOMY    . TONSILLECTOMY    . TUBAL LIGATION      OB History    No data available       Home Medications    Prior to Admission medications   Medication Sig Start Date End Date Taking? Authorizing Provider  acetaminophen (TYLENOL) 500 MG tablet Take 500 mg by mouth every 4 (four) hours as needed for mild pain, moderate pain, fever or headache.     [provider]  alum & mag hydroxide-simeth (MINTOX) 200-200-20 MG/5ML  suspension Take 30 mLs by mouth every 6 (six) hours as needed for indigestion or heartburn.     [provider]  aspirin 81 MG chewable tablet Chew 81 mg by mouth daily with breakfast.     [provider]  cephALEXin (KEFLEX) 500 MG capsule Take 1 capsule (500 mg total) by mouth 3 (three) times daily. Patient not taking: Reported on 12/10/2016 10/30/16   Raeford Razor, MD  clonazePAM (KLONOPIN) 0.5 MG tablet Take 1 tablet (0.5 mg total) by mouth 2 (two) times daily. 09/23/15   Hongalgi, Maximino Greenland, MD  divalproex (DEPAKOTE SPRINKLE) 125 MG capsule Take 125 mg by mouth 3 (three) times daily.     [provider]  escitalopram (LEXAPRO) 5 MG tablet Take 5 mg by mouth daily with breakfast.     [provider]  guaifenesin (ROBITUSSIN) 100 MG/5ML syrup Take 200 mg by mouth every 6 (six) hours as needed for cough.     [provider]  levothyroxine (SYNTHROID, LEVOTHROID) 50 MCG tablet Take 50 mcg by mouth daily before breakfast.     [provider]  loperamide (IMODIUM) 2 MG capsule Take 2 mg by mouth every 3 (three) hours as needed for diarrhea or loose stools.     [provider]  magnesium hydroxide (MILK OF MAGNESIA) 400 MG/5ML suspension Take 30 mLs by mouth at bedtime as needed for mild constipation.    [provider]  Multiple Vitamins-Minerals (MULTIVITAMIN WITH MINERALS) tablet Take 1 tablet by mouth daily with breakfast.     [provider]  Neomycin-Bacitracin-Polymyxin (TRIPLE ANTIBIOTIC) [phone] OINT Apply 1 application topically as needed (for wound care).     [provider]  Nutritional Supplements (NUTRITIONAL DRINK) LIQD Take 1 Bottle by mouth 3 (three) times daily with meals. *Mighty Shakes*    [provider]  risperiDONE (RISPERDAL) 0.5 MG tablet Take 1 tablet by mouth daily as needed (anxiety).  12/16/16   [provider]  rivastigmine (EXELON) 1.5 MG capsule Take 1.5 mg by  mouth 2 (two) times daily.    [provider]  senna (SENOKOT) 8.6 MG TABS tablet Take 2 tablets by mouth 2 (two) times daily.    [provider]  traMADol (ULTRAM) 50 MG tablet Take 1 tablet (50 mg total) by mouth every 6 (six) hours as needed for severe pain. 09/18/16   Edsel Petrin, DO  traZODone (DESYREL) 50 MG tablet Take 50 mg by mouth at bedtime.     [provider]    Family History Family History  Problem Relation Age of Onset  . Heart disease Mother   . Heart disease Father   . Heart disease Other   . Birth  defects Other     Social History Social History  Substance Use Topics  . Smoking status: Former Smoker    Packs/day: 0.20    Years: 1.00    Types: Cigarettes    Quit date: 06/02/1941  . Smokeless tobacco: Never Used  . Alcohol use No     Allergies   Morphine and related; Codeine; and Hydrocodone   Review of Systems Review of Systems  Unable to perform ROS: Dementia  Cardiovascular: Positive for chest pain.  Musculoskeletal: Positive for myalgias.     Physical Exam Updated Vital Signs BP (!) 172/65 (BP Location: Left Arm)   Pulse 71   Temp 98.5 F (36.9 C)   Resp 14   SpO2 99%   Physical Exam  Constitutional: She appears well-developed and well-nourished. No distress.  HENT:  Head: Normocephalic.  Eyes: Pupils are equal, round, and reactive to light. Conjunctivae and EOM are normal.  Neck: Neck supple.  Cardiovascular: Normal rate, regular rhythm and normal heart sounds.   Pulmonary/Chest: Effort normal and breath sounds normal. No respiratory distress. She has no wheezes. She has no rales.  Abdominal: Soft. Bowel sounds are normal. She exhibits no distension. There is no tenderness. There is no rebound.  Musculoskeletal: She exhibits no edema.  Neurological: She is alert.  Patient is very somnolent, but wakes up and answers when spoken to. She is disoriented 3. Able to move all 4 extremities. Unable to perform  finger-to-nose or heel-to-shin. Unable to hold her arms in front of her.  Skin: Skin is warm and dry.  Psychiatric: She has a normal mood and affect. Her behavior is normal.  Nursing note and vitals reviewed.    ED Treatments / Results  Labs (all labs ordered are listed, but only abnormal results are displayed) Labs Reviewed  COMPREHENSIVE METABOLIC PANEL - Abnormal; Notable for the following:       Result Value   Glucose, Bld 128 (*)    Creatinine, Ser 1.01 (*)    Albumin 3.3 (*)    GFR calc non Af Amer 46 (*)    GFR calc Af Amer 53 (*)    All other components within normal limits  URINALYSIS, ROUTINE W REFLEX MICROSCOPIC - Abnormal; Notable for the following:    Leukocytes, UA TRACE (*)    Squamous Epithelial / LPF 0-5 (*)    All other components within normal limits  CBC  I-STAT TROPONIN, ED  I-STAT CG4 LACTIC ACID, ED  I-STAT TROPONIN, ED  I-STAT TROPONIN, ED    EKG  EKG Interpretation  Date/Time:  Friday January 23 2017 21:34:07 EDT Ventricular Rate:  68 PR Interval:  214 QRS Duration: 136 QT Interval:  430 QTC Calculation: 457 R Axis:   -58 Text Interpretation:  Sinus rhythm with 1st degree A-V block Right bundle branch block Left anterior fascicular block Abnormal ECG No significant change since last tracing Confirmed by Rochele Raring [id number] on 01/24/2017 12:02:04 AM       Radiology Dg Chest 2 View  Result Date: 01/23/2017 CLINICAL DATA:  Chest pain.  Cough. EXAM: CHEST  2 VIEW COMPARISON:  Radiograph 10/29/2016, additional priors FINDINGS: The cardiomediastinal contours are normal, mild cardiomegaly, similar to prior. Mild subsegmental right lung base atelectasis. Pulmonary vasculature is normal. No consolidation, pleural effusion, or pneumothorax. No acute osseous abnormalities are seen. IMPRESSION: Stable mild cardiomegaly.  Mild right basilar atelectasis. Electronically Signed   By: Rubye Oaks M.D.   On: 01/23/2017 23:23    Procedures Procedures  (  including critical care time)  Medications Ordered in ED Medications - No data to display   Initial Impression / Assessment and Plan / ED Course  I have reviewed the triage vital signs and the nursing notes.  Pertinent labs & imaging results that were available during my care of the patient were reviewed by me and considered in my medical decision making (see chart for details).     Patient with history of dementia, here in the department with chest pain, myalgias, increased confusion. Symptoms onset this evening. Patient is DO NOT RESUSCITATE. She at this time she is disoriented 3 but alert. Will get labs, chest x-ray, urinalysis, will monitor. Vital signs show hypertension, blood pressures 172/65, otherwise normal.  Patient's labs are all unremarkable, urinalysis and no signs of infection. We'll monitor her, and we'll repeat troponin. At this time she is alert, very talkative, oriented to self. Denies any complaints.   Patient's repeat troponin is 0.00. Vital signs have been normal. Stable for discharge home. Follow with primary care doctor.  Vitals:   01/23/17 2329 01/24/17 0000 01/24/17 0100 01/24/17 0319  BP:  (!) 134/59 (!) 120/56 (!) 144/71  Pulse:  63 66 68  Resp:  14 13 16   Temp: (!) 96.9 F (36.1 C)     TempSrc: Rectal     SpO2:  97% 95% 99%     Final Clinical Impressions(s) / ED Diagnoses   Final diagnoses:  Atypical chest pain  Confusion    New Prescriptions New Prescriptions   No medications on file     Jaynie Crumble, [place] 01/24/17 1610

## 2017-01-23 NOTE — ED Notes (Signed)
Pt is from [facility]

## 2017-01-24 DIAGNOSIS — R4182 Altered mental status, unspecified: Secondary | ICD-10-CM | POA: Diagnosis not present

## 2017-01-24 DIAGNOSIS — S299XXA Unspecified injury of thorax, initial encounter: Secondary | ICD-10-CM | POA: Diagnosis not present

## 2017-01-24 DIAGNOSIS — G0430 Acute necrotizing hemorrhagic encephalopathy, unspecified: Secondary | ICD-10-CM | POA: Diagnosis not present

## 2017-01-24 LAB — I-STAT TROPONIN, ED: TROPONIN I, POC: 0 ng/mL (ref 0.00–0.08)

## 2017-01-24 NOTE — ED Notes (Signed)
Pt left at this time with all belongings.  

## 2017-01-24 NOTE — Discharge Instructions (Signed)
Patient's blood work is all within normal. No evidence of cardiac abnormalities today. No evidence of infectious process. Follow-up with family doctor as needed.

## 2017-01-24 NOTE — ED Notes (Signed)
Assisted pt to bedside commode, listless in bed. Unable to void. Repositioned. Pulled all of wires off. Removed them and positioned pt in stretcher.

## 2017-01-24 NOTE — ED Provider Notes (Signed)
Medical screening examination/treatment/procedure(s) were conducted as a shared visit with non-physician practitioner(s) and myself.  I personally evaluated the patient during the encounter.   EKG Interpretation  Date/Time:  Friday January 23 2017 21:34:07 EDT Ventricular Rate:  68 PR Interval:  214 QRS Duration: 136 QT Interval:  430 QTC Calculation: 457 R Axis:   -58 Text Interpretation:  Sinus rhythm with 1st degree A-V block Right bundle branch block Left anterior fascicular block Abnormal ECG No significant change since last tracing Confirmed by Rochele Raring [id number]) on 01/24/2017 12:02:04 AM      Patient is a 83 year old female with history of hypertension, dementia who comes from her nursing home with complaints of diffuse pain. There was reports of complaints of chest pain. At this time patient appears very confused but her son reports is his baseline. She has no complaints of pain at this time. Labs have been unremarkable. Normal lactate. Negative troponin 2. Urine shows no infection. EKG shows no significant change. Chest x-ray clear.  Patient has remained hemodynamically stable. Plan is to discharge her back to her nursing facility. No sign of trauma on exam. No sign of focal neurologic deficits.   Ward, Layla Maw, DO 01/24/17 [phone]

## 2017-01-25 DIAGNOSIS — G0430 Acute necrotizing hemorrhagic encephalopathy, unspecified: Secondary | ICD-10-CM | POA: Diagnosis not present

## 2017-01-26 DIAGNOSIS — G309 Alzheimer's disease, unspecified: Secondary | ICD-10-CM | POA: Diagnosis not present

## 2017-01-26 DIAGNOSIS — R296 Repeated falls: Secondary | ICD-10-CM | POA: Diagnosis not present

## 2017-01-26 DIAGNOSIS — F0281 Dementia in other diseases classified elsewhere with behavioral disturbance: Secondary | ICD-10-CM | POA: Diagnosis not present

## 2017-01-26 DIAGNOSIS — G0430 Acute necrotizing hemorrhagic encephalopathy, unspecified: Secondary | ICD-10-CM | POA: Diagnosis not present

## 2017-01-27 DIAGNOSIS — G0430 Acute necrotizing hemorrhagic encephalopathy, unspecified: Secondary | ICD-10-CM | POA: Diagnosis not present

## 2017-01-28 DIAGNOSIS — G0430 Acute necrotizing hemorrhagic encephalopathy, unspecified: Secondary | ICD-10-CM | POA: Diagnosis not present

## 2017-01-28 IMAGING — CR DG HIP (WITH OR WITHOUT PELVIS) 3-4V BILAT
5 series · 5 of 5 positions shown · non-contrast
Comparison: 01/07/2015 and 09/20/2015

CLINICAL DATA: Left greater than right hip and knee pain.  Fall.

EXAM:
DG HIP (WITH OR WITHOUT PELVIS) 3-4V BILAT

[t pelvis ap]
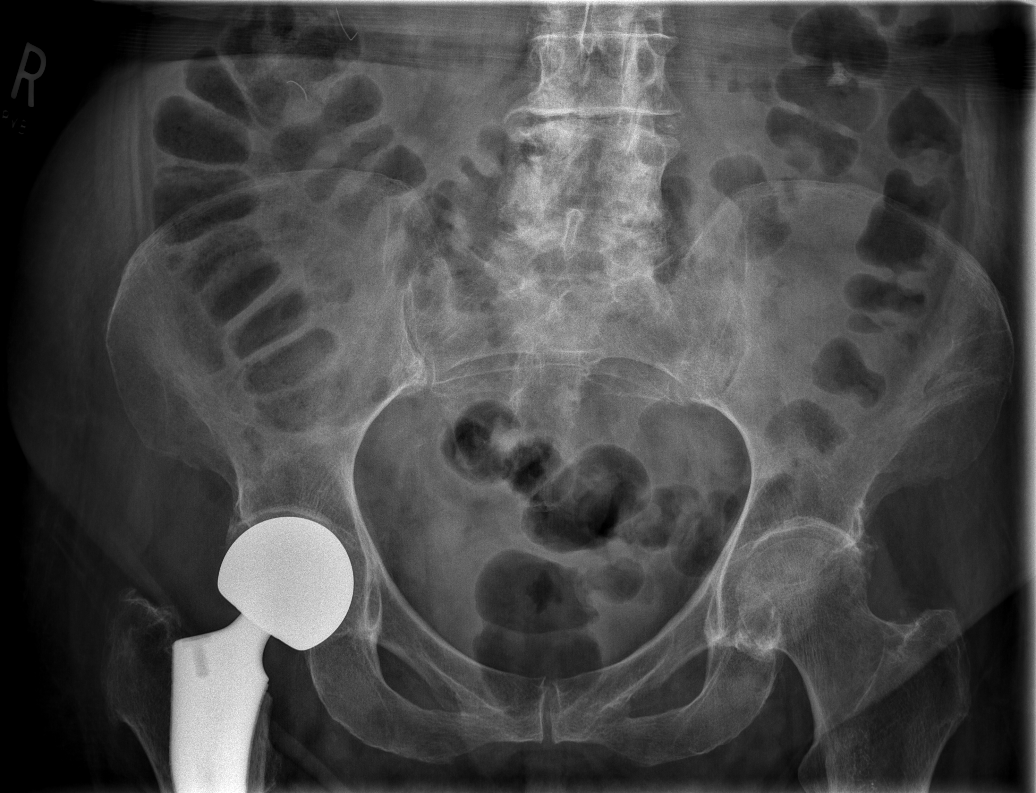

[t hip ap left]
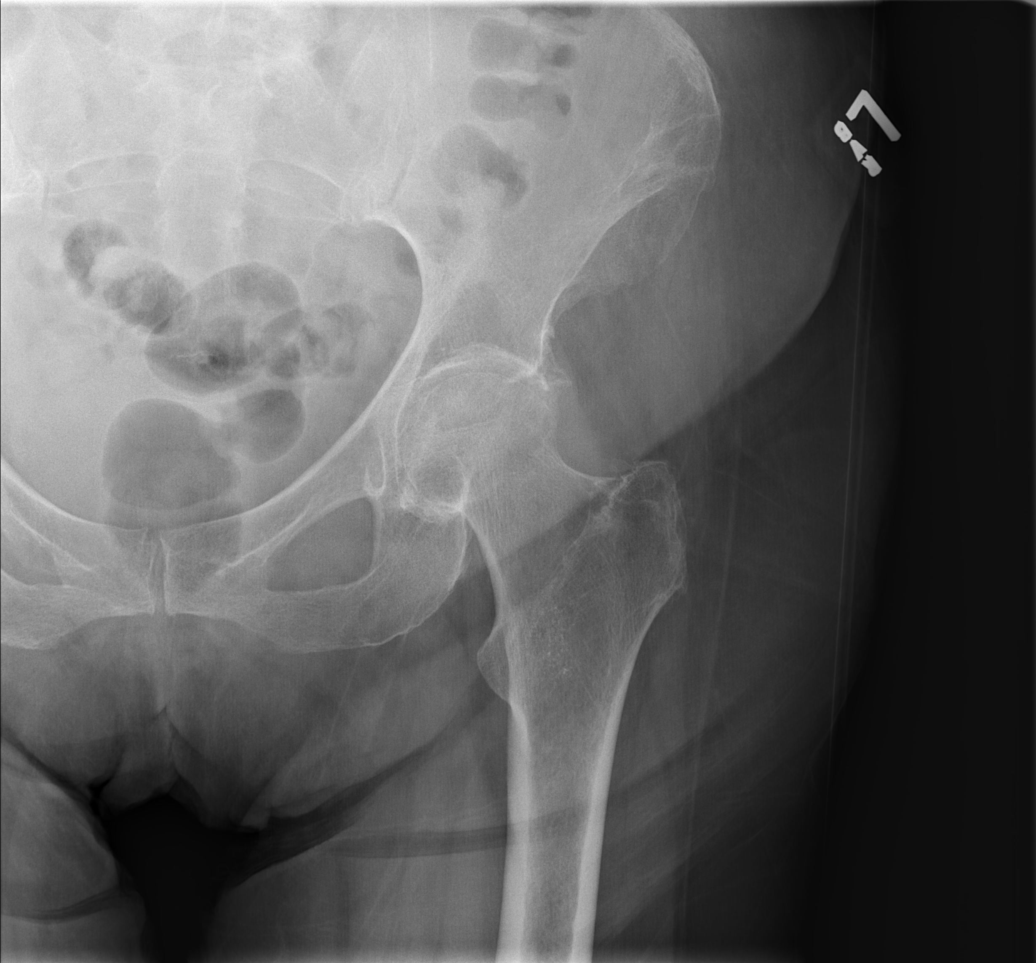

[t hip ap right]
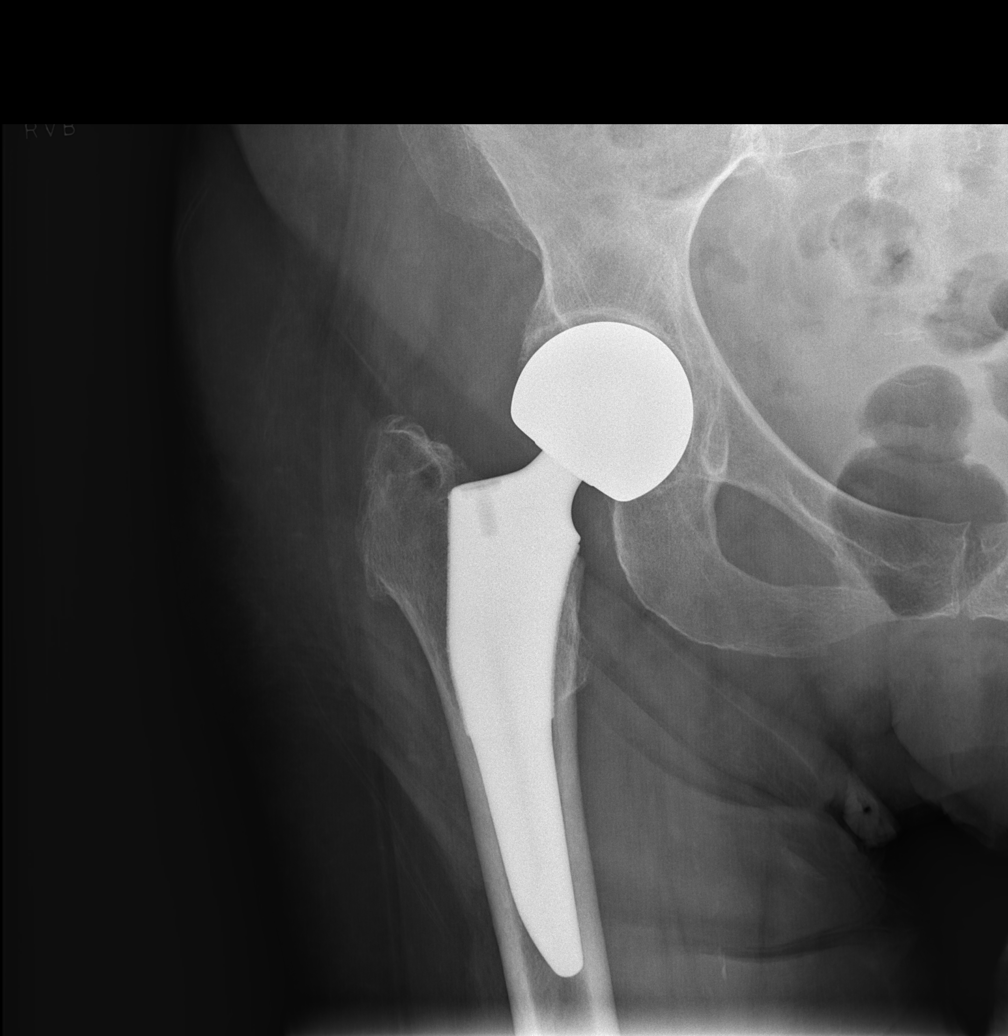

[t hip frog leg right]
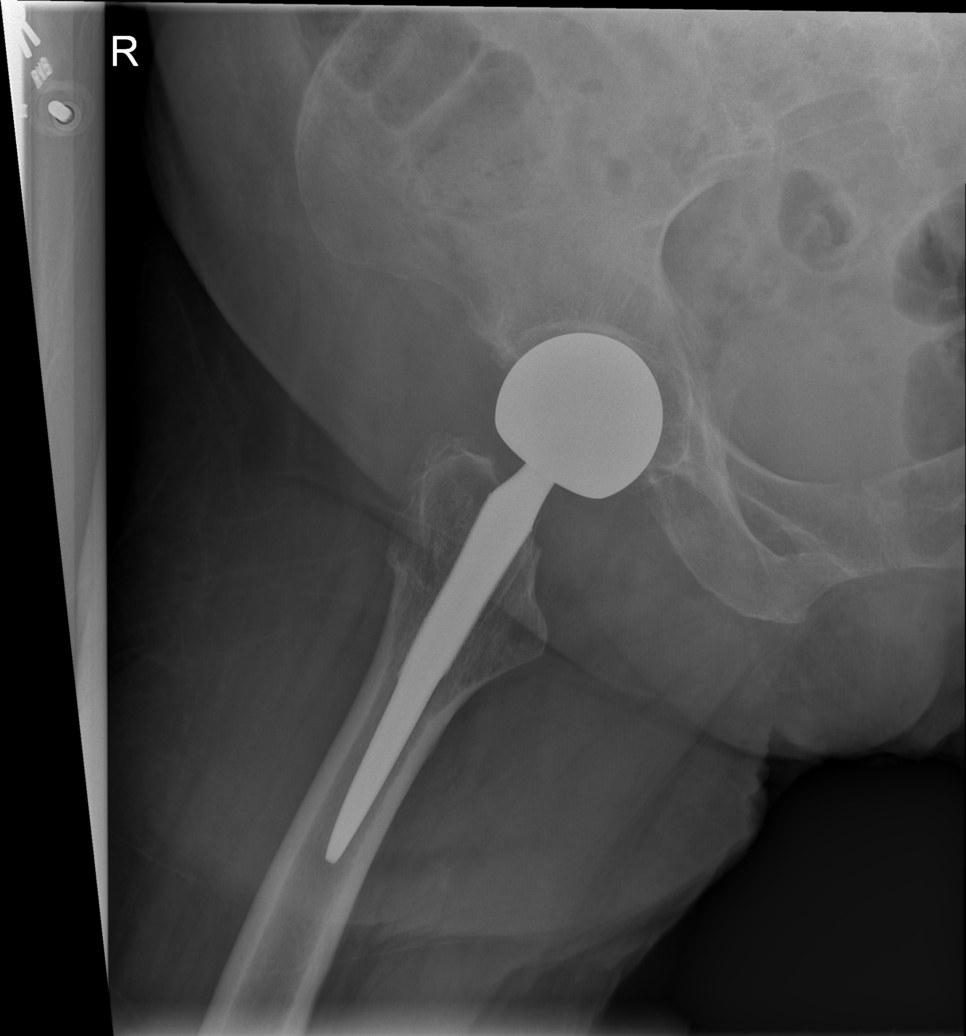

[t hip frog leg left]
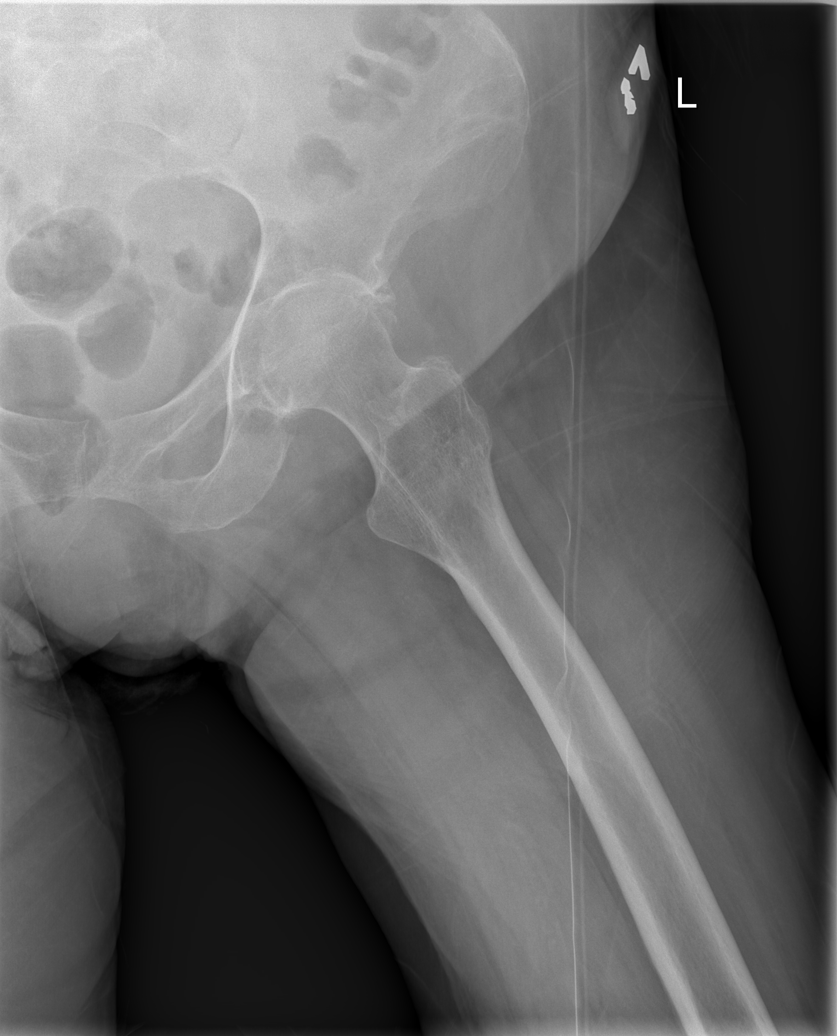

[5 of 5 positions shown; findings below may reference images not displayed]

FINDINGS: Lower lumbar spondylosis and degenerative disc disease,
postoperative findings at L4-5.

Bony demineralization. Right hip hemiarthroplasty. Moderate to
prominent degenerative arthropathy left hip with extensive spurring
but only moderate loss of articular space.

Arcuate lines of the sacrum intact.  I do not see a pelvic fracture.

No left hip fracture. I do not see a fracture or complicating
feature along the right hip hemiarthroplasty.
IMPRESSION: 1. No acute findings.
2. Moderate to prominent degenerative arthropathy of the left hip.
Right hip hemiarthroplasty.
3. Lower lumbar spondylosis and degenerative disc disease, with
postoperative findings in the mid to lower lumbar spine

## 2017-01-28 IMAGING — CR DG KNEE COMPLETE 4+V*L*
4 series · 4 of 4 positions shown · non-contrast
Comparison: None.

CLINICAL DATA: Bilateral hip and knee pain.  Fall.

EXAM:
LEFT KNEE - COMPLETE 4+ VIEW

[t knee ap left]
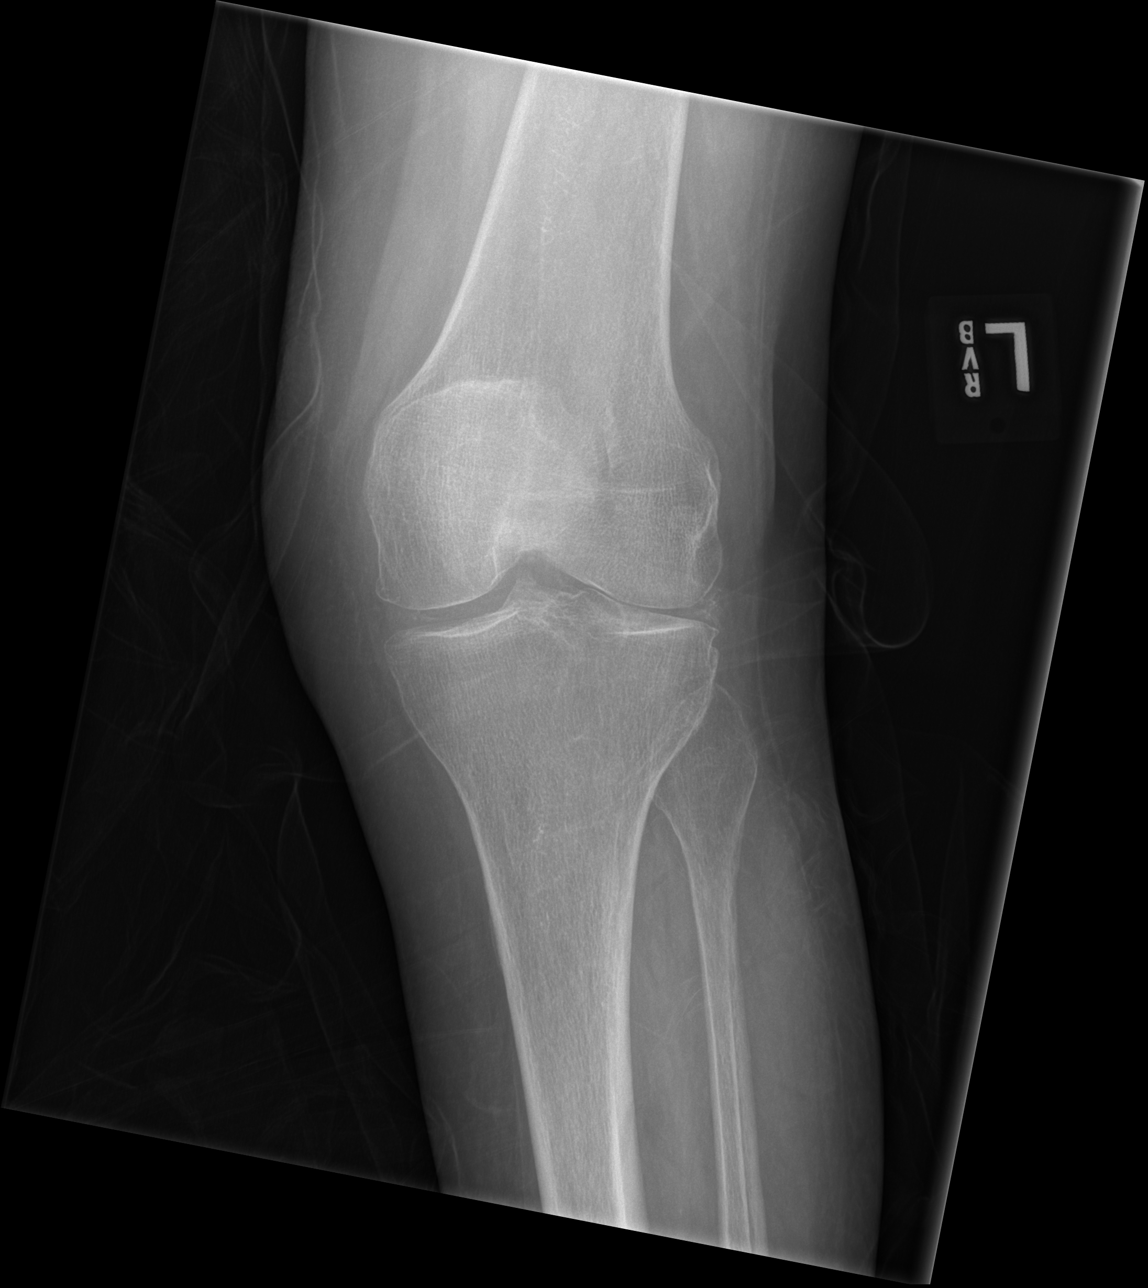

[t knee obl left (1 of 2)]
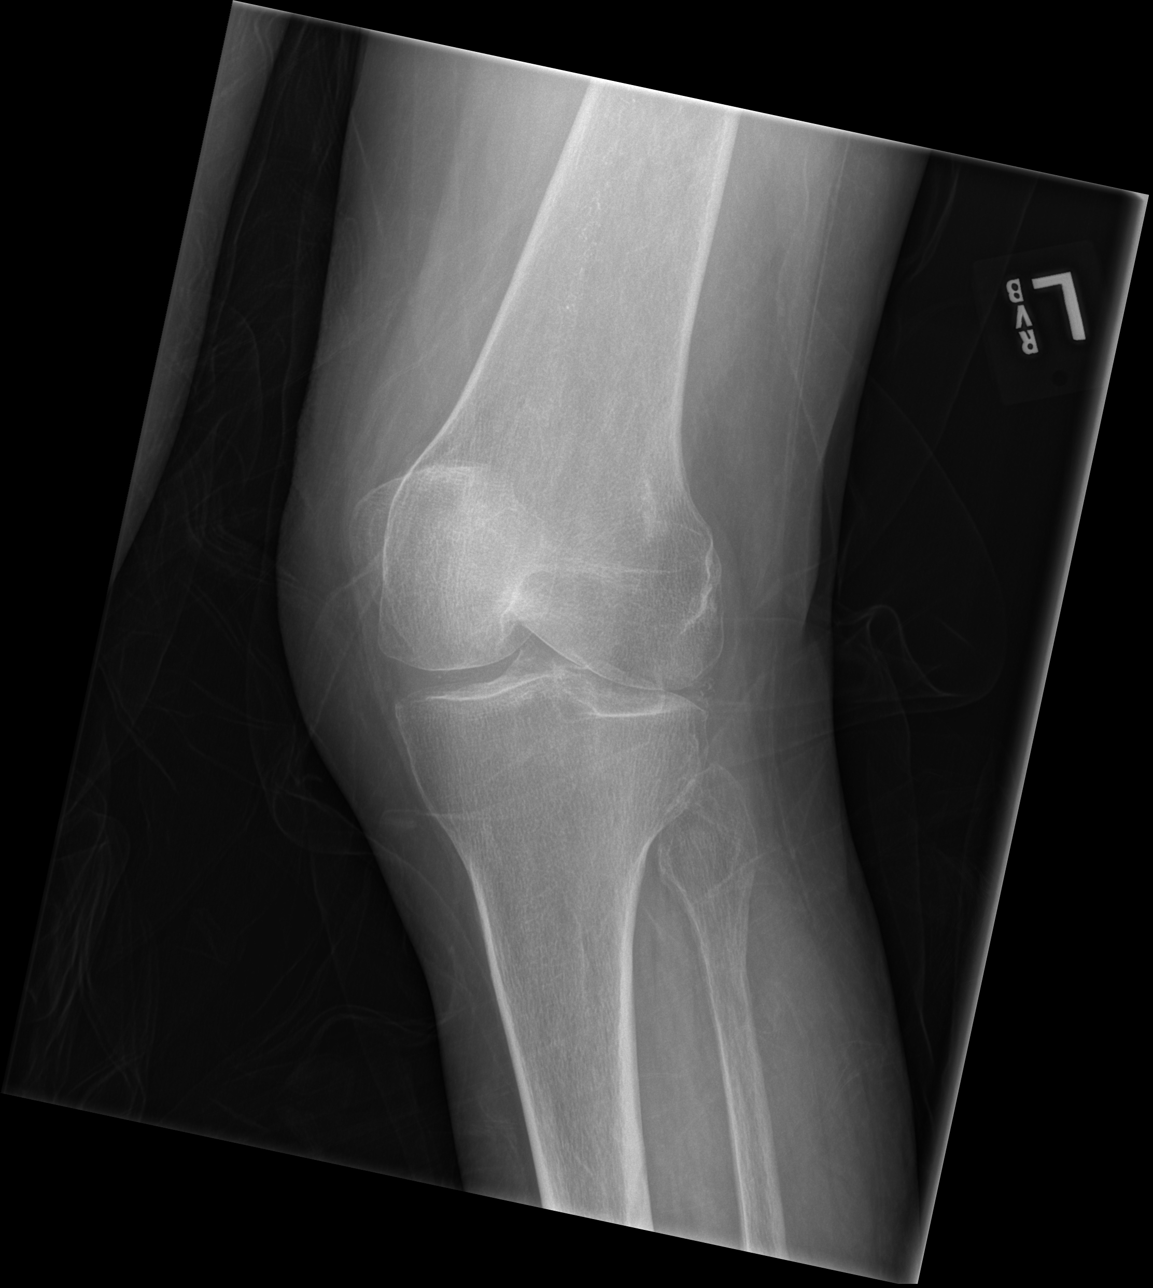

[t knee obl left (2 of 2)]
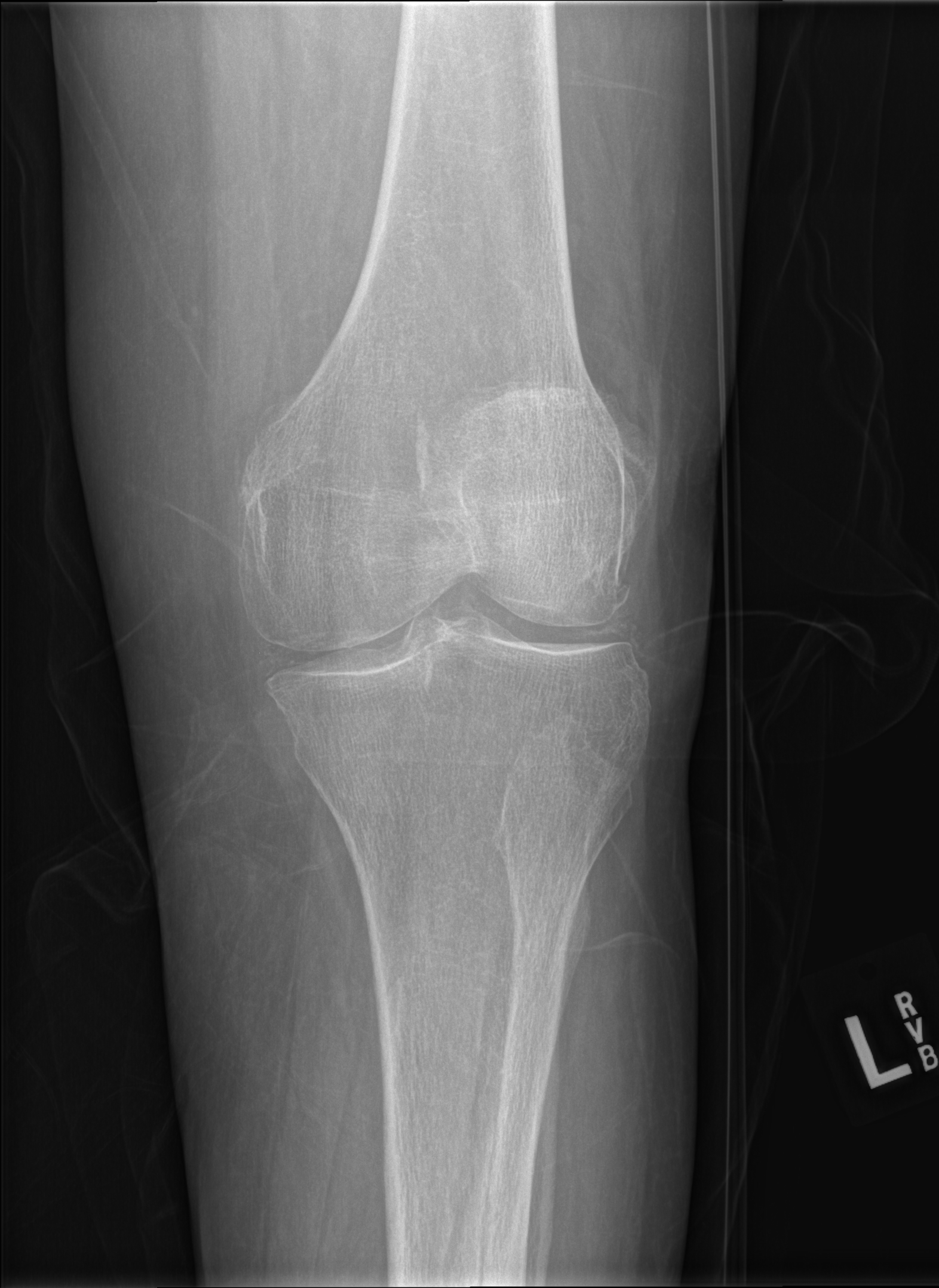

[x knee lat left]
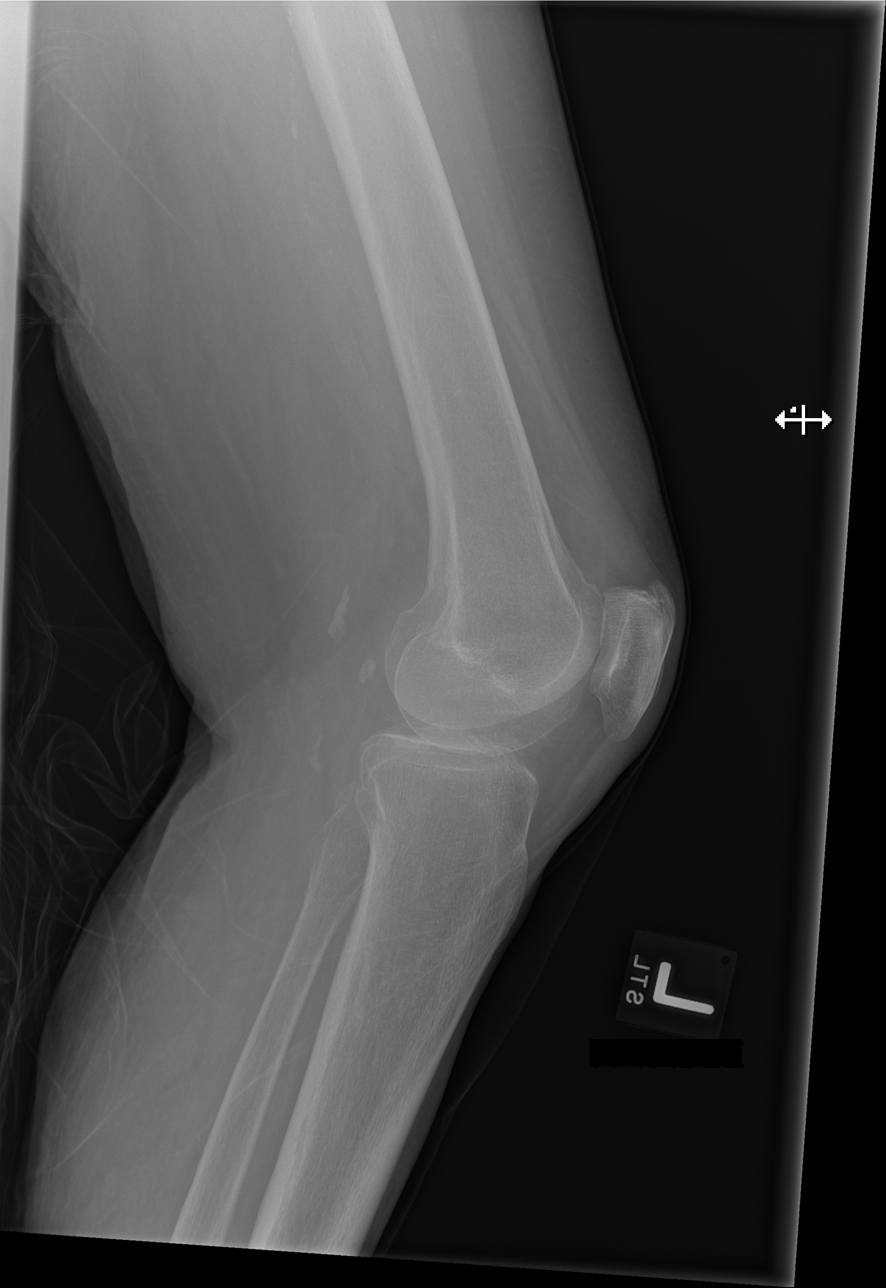

[4 of 4 positions shown; findings below may reference images not displayed]

FINDINGS: Menisci chondrocalcinosis. Moderate medial compartmental
degenerative articular space narrowing. Bony demineralization. Mild
marginal spurring. Spurring at the quadriceps insertion on the
patella.

Vascular calcifications noted in the popliteal region. No fracture
observed.
IMPRESSION: 1. Chondrocalcinosis suggesting CPPD arthropathy.
2. Moderate degenerative chondral thinning in the medial compartment
causing narrowing. Mild tricompartmental spurring.

## 2017-01-29 DIAGNOSIS — G0430 Acute necrotizing hemorrhagic encephalopathy, unspecified: Secondary | ICD-10-CM | POA: Diagnosis not present

## 2017-01-30 DIAGNOSIS — G0430 Acute necrotizing hemorrhagic encephalopathy, unspecified: Secondary | ICD-10-CM | POA: Diagnosis not present

## 2017-01-31 DIAGNOSIS — G0430 Acute necrotizing hemorrhagic encephalopathy, unspecified: Secondary | ICD-10-CM | POA: Diagnosis not present

## 2017-02-01 DIAGNOSIS — G0430 Acute necrotizing hemorrhagic encephalopathy, unspecified: Secondary | ICD-10-CM | POA: Diagnosis not present

## 2017-02-02 DIAGNOSIS — G0430 Acute necrotizing hemorrhagic encephalopathy, unspecified: Secondary | ICD-10-CM | POA: Diagnosis not present

## 2017-02-03 DIAGNOSIS — Z9181 History of falling: Secondary | ICD-10-CM | POA: Diagnosis not present

## 2017-02-03 DIAGNOSIS — G0430 Acute necrotizing hemorrhagic encephalopathy, unspecified: Secondary | ICD-10-CM | POA: Diagnosis not present

## 2017-02-03 DIAGNOSIS — M6281 Muscle weakness (generalized): Secondary | ICD-10-CM | POA: Diagnosis not present

## 2017-02-03 DIAGNOSIS — R278 Other lack of coordination: Secondary | ICD-10-CM | POA: Diagnosis not present

## 2017-02-04 DIAGNOSIS — G0430 Acute necrotizing hemorrhagic encephalopathy, unspecified: Secondary | ICD-10-CM | POA: Diagnosis not present

## 2017-02-05 DIAGNOSIS — G0430 Acute necrotizing hemorrhagic encephalopathy, unspecified: Secondary | ICD-10-CM | POA: Diagnosis not present

## 2017-02-06 DIAGNOSIS — G0430 Acute necrotizing hemorrhagic encephalopathy, unspecified: Secondary | ICD-10-CM | POA: Diagnosis not present

## 2017-02-07 DIAGNOSIS — G0430 Acute necrotizing hemorrhagic encephalopathy, unspecified: Secondary | ICD-10-CM | POA: Diagnosis not present

## 2017-02-08 DIAGNOSIS — G0430 Acute necrotizing hemorrhagic encephalopathy, unspecified: Secondary | ICD-10-CM | POA: Diagnosis not present

## 2017-02-09 DIAGNOSIS — G0430 Acute necrotizing hemorrhagic encephalopathy, unspecified: Secondary | ICD-10-CM | POA: Diagnosis not present

## 2017-02-10 DIAGNOSIS — R32 Unspecified urinary incontinence: Secondary | ICD-10-CM | POA: Diagnosis not present

## 2017-02-10 DIAGNOSIS — G0430 Acute necrotizing hemorrhagic encephalopathy, unspecified: Secondary | ICD-10-CM | POA: Diagnosis not present

## 2017-02-11 DIAGNOSIS — G0430 Acute necrotizing hemorrhagic encephalopathy, unspecified: Secondary | ICD-10-CM | POA: Diagnosis not present

## 2017-02-12 DIAGNOSIS — G0430 Acute necrotizing hemorrhagic encephalopathy, unspecified: Secondary | ICD-10-CM | POA: Diagnosis not present

## 2017-02-13 DIAGNOSIS — G0430 Acute necrotizing hemorrhagic encephalopathy, unspecified: Secondary | ICD-10-CM | POA: Diagnosis not present

## 2017-02-14 DIAGNOSIS — G0430 Acute necrotizing hemorrhagic encephalopathy, unspecified: Secondary | ICD-10-CM | POA: Diagnosis not present

## 2017-02-15 DIAGNOSIS — G0430 Acute necrotizing hemorrhagic encephalopathy, unspecified: Secondary | ICD-10-CM | POA: Diagnosis not present

## 2017-02-16 DIAGNOSIS — G0430 Acute necrotizing hemorrhagic encephalopathy, unspecified: Secondary | ICD-10-CM | POA: Diagnosis not present

## 2017-02-17 DIAGNOSIS — G0430 Acute necrotizing hemorrhagic encephalopathy, unspecified: Secondary | ICD-10-CM | POA: Diagnosis not present

## 2017-02-18 DIAGNOSIS — G0430 Acute necrotizing hemorrhagic encephalopathy, unspecified: Secondary | ICD-10-CM | POA: Diagnosis not present

## 2017-02-19 DIAGNOSIS — G0430 Acute necrotizing hemorrhagic encephalopathy, unspecified: Secondary | ICD-10-CM | POA: Diagnosis not present

## 2017-02-20 DIAGNOSIS — G0430 Acute necrotizing hemorrhagic encephalopathy, unspecified: Secondary | ICD-10-CM | POA: Diagnosis not present

## 2017-02-21 DIAGNOSIS — G0430 Acute necrotizing hemorrhagic encephalopathy, unspecified: Secondary | ICD-10-CM | POA: Diagnosis not present

## 2017-02-22 DIAGNOSIS — G0430 Acute necrotizing hemorrhagic encephalopathy, unspecified: Secondary | ICD-10-CM | POA: Diagnosis not present

## 2017-02-23 DIAGNOSIS — G0430 Acute necrotizing hemorrhagic encephalopathy, unspecified: Secondary | ICD-10-CM | POA: Diagnosis not present

## 2017-02-24 DIAGNOSIS — G0430 Acute necrotizing hemorrhagic encephalopathy, unspecified: Secondary | ICD-10-CM | POA: Diagnosis not present

## 2017-02-25 DIAGNOSIS — G0430 Acute necrotizing hemorrhagic encephalopathy, unspecified: Secondary | ICD-10-CM | POA: Diagnosis not present

## 2017-02-26 DIAGNOSIS — G0430 Acute necrotizing hemorrhagic encephalopathy, unspecified: Secondary | ICD-10-CM | POA: Diagnosis not present

## 2017-02-27 DIAGNOSIS — G0430 Acute necrotizing hemorrhagic encephalopathy, unspecified: Secondary | ICD-10-CM | POA: Diagnosis not present

## 2017-02-28 DIAGNOSIS — G0430 Acute necrotizing hemorrhagic encephalopathy, unspecified: Secondary | ICD-10-CM | POA: Diagnosis not present

## 2017-03-01 DIAGNOSIS — G0430 Acute necrotizing hemorrhagic encephalopathy, unspecified: Secondary | ICD-10-CM | POA: Diagnosis not present

## 2017-03-02 DIAGNOSIS — G0430 Acute necrotizing hemorrhagic encephalopathy, unspecified: Secondary | ICD-10-CM | POA: Diagnosis not present

## 2017-03-03 DIAGNOSIS — G0430 Acute necrotizing hemorrhagic encephalopathy, unspecified: Secondary | ICD-10-CM | POA: Diagnosis not present

## 2017-03-04 DIAGNOSIS — G0430 Acute necrotizing hemorrhagic encephalopathy, unspecified: Secondary | ICD-10-CM | POA: Diagnosis not present

## 2017-03-05 DIAGNOSIS — G0430 Acute necrotizing hemorrhagic encephalopathy, unspecified: Secondary | ICD-10-CM | POA: Diagnosis not present

## 2017-03-06 DIAGNOSIS — G0430 Acute necrotizing hemorrhagic encephalopathy, unspecified: Secondary | ICD-10-CM | POA: Diagnosis not present

## 2017-03-07 DIAGNOSIS — G0430 Acute necrotizing hemorrhagic encephalopathy, unspecified: Secondary | ICD-10-CM | POA: Diagnosis not present

## 2017-03-08 DIAGNOSIS — G0430 Acute necrotizing hemorrhagic encephalopathy, unspecified: Secondary | ICD-10-CM | POA: Diagnosis not present

## 2017-03-09 DIAGNOSIS — G0430 Acute necrotizing hemorrhagic encephalopathy, unspecified: Secondary | ICD-10-CM | POA: Diagnosis not present

## 2017-03-10 DIAGNOSIS — G0430 Acute necrotizing hemorrhagic encephalopathy, unspecified: Secondary | ICD-10-CM | POA: Diagnosis not present

## 2017-03-11 DIAGNOSIS — G0430 Acute necrotizing hemorrhagic encephalopathy, unspecified: Secondary | ICD-10-CM | POA: Diagnosis not present

## 2017-03-12 DIAGNOSIS — G0430 Acute necrotizing hemorrhagic encephalopathy, unspecified: Secondary | ICD-10-CM | POA: Diagnosis not present

## 2017-03-13 DIAGNOSIS — G0430 Acute necrotizing hemorrhagic encephalopathy, unspecified: Secondary | ICD-10-CM | POA: Diagnosis not present

## 2017-03-14 DIAGNOSIS — G0430 Acute necrotizing hemorrhagic encephalopathy, unspecified: Secondary | ICD-10-CM | POA: Diagnosis not present

## 2017-03-15 DIAGNOSIS — G0430 Acute necrotizing hemorrhagic encephalopathy, unspecified: Secondary | ICD-10-CM | POA: Diagnosis not present

## 2017-03-16 DIAGNOSIS — F0281 Dementia in other diseases classified elsewhere with behavioral disturbance: Secondary | ICD-10-CM | POA: Diagnosis not present

## 2017-03-16 DIAGNOSIS — F411 Generalized anxiety disorder: Secondary | ICD-10-CM | POA: Diagnosis not present

## 2017-03-16 DIAGNOSIS — G0430 Acute necrotizing hemorrhagic encephalopathy, unspecified: Secondary | ICD-10-CM | POA: Diagnosis not present

## 2017-03-16 DIAGNOSIS — F330 Major depressive disorder, recurrent, mild: Secondary | ICD-10-CM | POA: Diagnosis not present

## 2017-03-17 DIAGNOSIS — G0430 Acute necrotizing hemorrhagic encephalopathy, unspecified: Secondary | ICD-10-CM | POA: Diagnosis not present

## 2017-03-18 DIAGNOSIS — G0430 Acute necrotizing hemorrhagic encephalopathy, unspecified: Secondary | ICD-10-CM | POA: Diagnosis not present

## 2017-03-19 DIAGNOSIS — G0430 Acute necrotizing hemorrhagic encephalopathy, unspecified: Secondary | ICD-10-CM | POA: Diagnosis not present

## 2017-03-20 DIAGNOSIS — G0430 Acute necrotizing hemorrhagic encephalopathy, unspecified: Secondary | ICD-10-CM | POA: Diagnosis not present

## 2017-03-21 DIAGNOSIS — G0430 Acute necrotizing hemorrhagic encephalopathy, unspecified: Secondary | ICD-10-CM | POA: Diagnosis not present

## 2017-03-22 DIAGNOSIS — G0430 Acute necrotizing hemorrhagic encephalopathy, unspecified: Secondary | ICD-10-CM | POA: Diagnosis not present

## 2017-03-23 DIAGNOSIS — G0430 Acute necrotizing hemorrhagic encephalopathy, unspecified: Secondary | ICD-10-CM | POA: Diagnosis not present

## 2017-03-24 DIAGNOSIS — G0430 Acute necrotizing hemorrhagic encephalopathy, unspecified: Secondary | ICD-10-CM | POA: Diagnosis not present

## 2017-03-25 ENCOUNTER — Encounter (HOSPITAL_COMMUNITY): Payer: Self-pay | Admitting: Emergency Medicine

## 2017-03-25 ENCOUNTER — Emergency Department (HOSPITAL_COMMUNITY): Payer: Medicare HMO

## 2017-03-25 ENCOUNTER — Emergency Department (HOSPITAL_COMMUNITY)
Admission: EM | Admit: 2017-03-25 | Discharge: 2017-03-26 | Disposition: A | Discharge status: Home / Self Care | Payer: Medicare HMO | Attending: Emergency Medicine | Admitting: Emergency Medicine

## 2017-03-25 DIAGNOSIS — Y939 Activity, unspecified: Secondary | ICD-10-CM | POA: Diagnosis not present

## 2017-03-25 DIAGNOSIS — M25559 Pain in unspecified hip: Secondary | ICD-10-CM | POA: Insufficient documentation

## 2017-03-25 DIAGNOSIS — I129 Hypertensive chronic kidney disease with stage 1 through stage 4 chronic kidney disease, or unspecified chronic kidney disease: Secondary | ICD-10-CM | POA: Diagnosis not present

## 2017-03-25 DIAGNOSIS — N189 Chronic kidney disease, unspecified: Secondary | ICD-10-CM | POA: Insufficient documentation

## 2017-03-25 DIAGNOSIS — W19XXXA Unspecified fall, initial encounter: Secondary | ICD-10-CM

## 2017-03-25 DIAGNOSIS — Z79899 Other long term (current) drug therapy: Secondary | ICD-10-CM | POA: Insufficient documentation

## 2017-03-25 DIAGNOSIS — W07XXXA Fall from chair, initial encounter: Secondary | ICD-10-CM | POA: Insufficient documentation

## 2017-03-25 DIAGNOSIS — S064X0A Epidural hemorrhage without loss of consciousness, initial encounter: Secondary | ICD-10-CM | POA: Diagnosis not present

## 2017-03-25 DIAGNOSIS — S299XXA Unspecified injury of thorax, initial encounter: Secondary | ICD-10-CM | POA: Diagnosis not present

## 2017-03-25 DIAGNOSIS — F05 Delirium due to known physiological condition: Secondary | ICD-10-CM | POA: Diagnosis not present

## 2017-03-25 DIAGNOSIS — R51 Headache: Secondary | ICD-10-CM | POA: Diagnosis not present

## 2017-03-25 DIAGNOSIS — G309 Alzheimer's disease, unspecified: Secondary | ICD-10-CM | POA: Diagnosis not present

## 2017-03-25 DIAGNOSIS — S199XXA Unspecified injury of neck, initial encounter: Secondary | ICD-10-CM | POA: Diagnosis not present

## 2017-03-25 DIAGNOSIS — S0990XA Unspecified injury of head, initial encounter: Secondary | ICD-10-CM | POA: Insufficient documentation

## 2017-03-25 DIAGNOSIS — M545 Low back pain: Secondary | ICD-10-CM | POA: Insufficient documentation

## 2017-03-25 DIAGNOSIS — Y999 Unspecified external cause status: Secondary | ICD-10-CM | POA: Diagnosis not present

## 2017-03-25 DIAGNOSIS — Y92129 Unspecified place in nursing home as the place of occurrence of the external cause: Secondary | ICD-10-CM | POA: Insufficient documentation

## 2017-03-25 DIAGNOSIS — F0280 Dementia in other diseases classified elsewhere without behavioral disturbance: Secondary | ICD-10-CM | POA: Insufficient documentation

## 2017-03-25 DIAGNOSIS — M25551 Pain in right hip: Secondary | ICD-10-CM | POA: Diagnosis not present

## 2017-03-25 DIAGNOSIS — M25552 Pain in left hip: Secondary | ICD-10-CM | POA: Diagnosis not present

## 2017-03-25 DIAGNOSIS — E039 Hypothyroidism, unspecified: Secondary | ICD-10-CM | POA: Insufficient documentation

## 2017-03-25 DIAGNOSIS — Z043 Encounter for examination and observation following other accident: Secondary | ICD-10-CM | POA: Diagnosis not present

## 2017-03-25 LAB — URINALYSIS, ROUTINE W REFLEX MICROSCOPIC
Bilirubin Urine: NEGATIVE
GLUCOSE, UA: NEGATIVE mg/dL
Hgb urine dipstick: NEGATIVE
KETONES UR: 5 mg/dL — AB
Nitrite: NEGATIVE
PROTEIN: NEGATIVE mg/dL
Specific Gravity, Urine: 1.024 (ref 1.005–1.030)
pH: 5 (ref 5.0–8.0)

## 2017-03-25 LAB — CBC WITH DIFFERENTIAL/PLATELET
BASOS ABS: 0 10*3/uL (ref 0.0–0.1)
BASOS PCT: 0 %
Eosinophils Absolute: 0.1 10*3/uL (ref 0.0–0.7)
Eosinophils Relative: 1 %
HCT: 36.3 % (ref 36.0–46.0)
HEMOGLOBIN: 11.8 g/dL — AB (ref 12.0–15.0)
LYMPHS PCT: 16 %
Lymphs Abs: 1.7 10*3/uL (ref 0.7–4.0)
MCH: 28.9 pg (ref 26.0–34.0)
MCHC: 32.5 g/dL (ref 30.0–36.0)
MCV: 89 fL (ref 78.0–100.0)
MONO ABS: 0.6 10*3/uL (ref 0.1–1.0)
MONOS PCT: 6 %
NEUTROS ABS: 8 10*3/uL — AB (ref 1.7–7.7)
NEUTROS PCT: 77 %
PLATELETS: 197 10*3/uL (ref 150–400)
RBC: 4.08 MIL/uL (ref 3.87–5.11)
RDW: 13.4 % (ref 11.5–15.5)
WBC: 10.4 10*3/uL (ref 4.0–10.5)

## 2017-03-25 MED ORDER — LIDOCAINE 5 % EX PTCH
1.0000 | MEDICATED_PATCH | CUTANEOUS | Status: DC
  Administered 2017-03-25: 1 via TRANSDERMAL
  Filled 2017-03-25: qty 1

## 2017-03-25 MED ORDER — ACETAMINOPHEN 325 MG PO TABS
650.0000 mg | ORAL_TABLET | Freq: Once | ORAL | Status: AC
  Administered 2017-03-25: 650 mg via ORAL
  Filled 2017-03-25: qty 2
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
## 2017-03-25 NOTE — ED Notes (Signed)
Patient transported to CT and xray 

## 2017-03-26 ENCOUNTER — Emergency Department (HOSPITAL_COMMUNITY): Payer: Medicare HMO

## 2017-03-26 DIAGNOSIS — S199XXA Unspecified injury of neck, initial encounter: Secondary | ICD-10-CM | POA: Diagnosis not present

## 2017-03-26 DIAGNOSIS — R4182 Altered mental status, unspecified: Secondary | ICD-10-CM | POA: Diagnosis not present

## 2017-03-26 DIAGNOSIS — G0430 Acute necrotizing hemorrhagic encephalopathy, unspecified: Secondary | ICD-10-CM | POA: Diagnosis not present

## 2017-03-26 DIAGNOSIS — S0990XA Unspecified injury of head, initial encounter: Secondary | ICD-10-CM | POA: Diagnosis not present

## 2017-03-26 LAB — COMPREHENSIVE METABOLIC PANEL
ALBUMIN: 3.2 g/dL — AB (ref 3.5–5.0)
ALT: 17 U/L (ref 14–54)
AST: 22 U/L (ref 15–41)
Alkaline Phosphatase: 79 U/L (ref 38–126)
Anion gap: 9 (ref 5–15)
BUN: 24 mg/dL — AB (ref 6–20)
CHLORIDE: 103 mmol/L (ref 101–111)
CO2: 27 mmol/L (ref 22–32)
Calcium: 8.8 mg/dL — ABNORMAL LOW (ref 8.9–10.3)
Creatinine, Ser: 0.89 mg/dL (ref 0.44–1.00)
GFR calc Af Amer: >60 mL/min (ref >60)
GFR, EST NON AFRICAN AMERICAN: 54 mL/min — AB (ref >60)
Glucose, Bld: 135 mg/dL — ABNORMAL HIGH (ref 65–99)
POTASSIUM: 4.3 mmol/L (ref 3.5–5.1)
SODIUM: 139 mmol/L (ref 135–145)
Total Bilirubin: 0.4 mg/dL (ref 0.3–1.2)
Total Protein: 6.2 g/dL — ABNORMAL LOW (ref 6.5–8.1)

## 2017-03-26 NOTE — ED Notes (Signed)
Report called back to [facility] 
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
## 2017-03-26 NOTE — ED Notes (Signed)
Patient transported to CT 

## 2017-03-26 NOTE — ED Notes (Signed)
[facility] and sent to [facility].

## 2017-03-26 NOTE — ED Notes (Signed)
Son [name] is POA and would love any information about his mother.  Please call if possible:  [phone].

## 2017-03-26 NOTE — Discharge Instructions (Signed)
Please follow with your primary care doctor in the next 2 days for a check-up. They must obtain records for further management.  ° °Do not hesitate to return to the Emergency Department for any new, worsening or concerning symptoms.  ° °

## 2017-03-27 ENCOUNTER — Emergency Department (HOSPITAL_COMMUNITY): Payer: Medicare HMO

## 2017-03-27 ENCOUNTER — Emergency Department (HOSPITAL_COMMUNITY)
Admission: EM | Admit: 2017-03-27 | Discharge: 2017-03-27 | Disposition: A | Discharge status: Home / Self Care | Payer: Medicare HMO | Attending: Emergency Medicine | Admitting: Emergency Medicine

## 2017-03-27 ENCOUNTER — Encounter (HOSPITAL_COMMUNITY): Payer: Self-pay | Admitting: Emergency Medicine

## 2017-03-27 DIAGNOSIS — E039 Hypothyroidism, unspecified: Secondary | ICD-10-CM | POA: Insufficient documentation

## 2017-03-27 DIAGNOSIS — T887XXA Unspecified adverse effect of drug or medicament, initial encounter: Secondary | ICD-10-CM | POA: Diagnosis not present

## 2017-03-27 DIAGNOSIS — Z7982 Long term (current) use of aspirin: Secondary | ICD-10-CM | POA: Diagnosis not present

## 2017-03-27 DIAGNOSIS — R4182 Altered mental status, unspecified: Secondary | ICD-10-CM | POA: Diagnosis not present

## 2017-03-27 DIAGNOSIS — R400 Somnolence: Secondary | ICD-10-CM | POA: Insufficient documentation

## 2017-03-27 DIAGNOSIS — G0430 Acute necrotizing hemorrhagic encephalopathy, unspecified: Secondary | ICD-10-CM | POA: Diagnosis not present

## 2017-03-27 DIAGNOSIS — J45909 Unspecified asthma, uncomplicated: Secondary | ICD-10-CM | POA: Diagnosis not present

## 2017-03-27 DIAGNOSIS — Y69 Unspecified misadventure during surgical and medical care: Secondary | ICD-10-CM | POA: Diagnosis not present

## 2017-03-27 DIAGNOSIS — Z885 Allergy status to narcotic agent status: Secondary | ICD-10-CM | POA: Diagnosis not present

## 2017-03-27 DIAGNOSIS — S0990XA Unspecified injury of head, initial encounter: Secondary | ICD-10-CM | POA: Diagnosis not present

## 2017-03-27 DIAGNOSIS — F0390 Unspecified dementia without behavioral disturbance: Secondary | ICD-10-CM | POA: Diagnosis not present

## 2017-03-27 DIAGNOSIS — T50901A Poisoning by unspecified drugs, medicaments and biological substances, accidental (unintentional), initial encounter: Secondary | ICD-10-CM | POA: Diagnosis not present

## 2017-03-27 DIAGNOSIS — R402441 Other coma, without documented Glasgow coma scale score, or with partial score reported, in the field [EMT or ambulance]: Secondary | ICD-10-CM | POA: Diagnosis not present

## 2017-03-27 DIAGNOSIS — I10 Essential (primary) hypertension: Secondary | ICD-10-CM | POA: Insufficient documentation

## 2017-03-27 DIAGNOSIS — Z87891 Personal history of nicotine dependence: Secondary | ICD-10-CM | POA: Insufficient documentation

## 2017-03-27 DIAGNOSIS — R6889 Other general symptoms and signs: Secondary | ICD-10-CM | POA: Diagnosis not present

## 2017-03-27 DIAGNOSIS — R9431 Abnormal electrocardiogram [ECG] [EKG]: Secondary | ICD-10-CM | POA: Diagnosis not present

## 2017-03-27 DIAGNOSIS — Z79899 Other long term (current) drug therapy: Secondary | ICD-10-CM | POA: Insufficient documentation

## 2017-03-27 LAB — URINALYSIS, COMPLETE (UACMP) WITH MICROSCOPIC
Bilirubin Urine: NEGATIVE
GLUCOSE, UA: NEGATIVE mg/dL
Hgb urine dipstick: NEGATIVE
KETONES UR: NEGATIVE mg/dL
NITRITE: NEGATIVE
PH: 6 (ref 5.0–8.0)
Protein, ur: NEGATIVE mg/dL
Specific Gravity, Urine: 1.018 (ref 1.005–1.030)
Squamous Epithelial / LPF: NONE SEEN

## 2017-03-27 LAB — URINE CULTURE: CULTURE: NO GROWTH

## 2017-03-27 LAB — I-STAT VENOUS BLOOD GAS, ED
Acid-Base Excess: 8 mmol/L — ABNORMAL HIGH (ref 0.0–2.0)
Bicarbonate: 33.9 mmol/L — ABNORMAL HIGH (ref 20.0–28.0)
O2 SAT: 69 %
PCO2 VEN: 55.5 mmHg (ref 44.0–60.0)
PO2 VEN: 37 mmHg (ref 32.0–45.0)
TCO2: 36 mmol/L — AB (ref 22–32)
pH, Ven: 7.394 (ref 7.250–7.430)

## 2017-03-27 LAB — CBC WITH DIFFERENTIAL/PLATELET
BASOS PCT: 0 %
Basophils Absolute: 0 10*3/uL (ref 0.0–0.1)
EOS ABS: 0.2 10*3/uL (ref 0.0–0.7)
EOS PCT: 2 %
HCT: 36.7 % (ref 36.0–46.0)
HEMOGLOBIN: 11.8 g/dL — AB (ref 12.0–15.0)
LYMPHS ABS: 2.5 10*3/uL (ref 0.7–4.0)
Lymphocytes Relative: 25 %
MCH: 28.8 pg (ref 26.0–34.0)
MCHC: 32.2 g/dL (ref 30.0–36.0)
MCV: 89.5 fL (ref 78.0–100.0)
MONO ABS: 0.6 10*3/uL (ref 0.1–1.0)
MONOS PCT: 6 %
Neutro Abs: 6.9 10*3/uL (ref 1.7–7.7)
Neutrophils Relative %: 67 %
PLATELETS: 196 10*3/uL (ref 150–400)
RBC: 4.1 MIL/uL (ref 3.87–5.11)
RDW: 13.6 % (ref 11.5–15.5)
WBC: 10.1 10*3/uL (ref 4.0–10.5)

## 2017-03-27 LAB — RAPID URINE DRUG SCREEN, HOSP PERFORMED
AMPHETAMINES: NOT DETECTED
BENZODIAZEPINES: NOT DETECTED
Barbiturates: NOT DETECTED
Cocaine: NOT DETECTED
OPIATES: NOT DETECTED
TETRAHYDROCANNABINOL: NOT DETECTED

## 2017-03-27 LAB — COMPREHENSIVE METABOLIC PANEL
ALK PHOS: 77 U/L (ref 38–126)
ALT: 19 U/L (ref 14–54)
AST: 22 U/L (ref 15–41)
Albumin: 3.1 g/dL — ABNORMAL LOW (ref 3.5–5.0)
Anion gap: 6 (ref 5–15)
BUN: 20 mg/dL (ref 6–20)
CALCIUM: 8.9 mg/dL (ref 8.9–10.3)
CHLORIDE: 106 mmol/L (ref 101–111)
CO2: 29 mmol/L (ref 22–32)
CREATININE: 0.86 mg/dL (ref 0.44–1.00)
GFR calc non Af Amer: 56 mL/min — ABNORMAL LOW (ref >60)
Glucose, Bld: 98 mg/dL (ref 65–99)
Potassium: 4.7 mmol/L (ref 3.5–5.1)
SODIUM: 141 mmol/L (ref 135–145)
Total Bilirubin: 0.7 mg/dL (ref 0.3–1.2)
Total Protein: 5.9 g/dL — ABNORMAL LOW (ref 6.5–8.1)

## 2017-03-27 LAB — CBG MONITORING, ED: Glucose-Capillary: 92 mg/dL (ref 65–99)

## 2017-03-27 MED ORDER — NALOXONE HCL 0.4 MG/ML IJ SOLN
0.4000 mg | Freq: Once | INTRAMUSCULAR | Status: AC
  Administered 2017-03-27: 0.4 mg via INTRAVENOUS
  Filled 2017-03-27: qty 1
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
## 2017-03-27 NOTE — ED Provider Notes (Signed)
[facility] EMERGENCY DEPARTMENT Provider Note  CSN: [id number] Arrival date & time: 03/27/17 1113  Chief Complaint(s) Drug Overdose  HPI [name] is a 83 y.o. female with extensive past medical history listed below including dementia and a reported history of stroke who presents to the emergency department for somnolence per EMS.  EMS reports that per skilled nursing facility staff, patient was eating breakfast and was given her morning medication including a pain medication.  Following this the patient became sleepy and difficult to arouse.  When EMS arrived patient was in a wheelchair in the dining room, sleeping and difficult to arouse, in no respiratory distress with pinpoint pupils.  CBG within normal limits.  No Narcan was given.  She remained hemodynamically stable in route.  EMS reported that the facility did not notice any focal weakness and did not mention any residual weakness from her prior stroke.  Reviewing the patient's records it looks like patient got all of her morning medication.  There is a note saying that she got trazodone on 10/25.  Patient also received Xanax 0.5 mg this morning.  Tramadol is noted in her list but no mention of whether he was given this morning.  Of note patient was seen here 2 days ago for fall resulting in head trauma with negative CT head and cervical spine.  She is not on any anticoagulation.  Remainder of history, ROS, and physical exam limited due to patient's condition (dementia and somnolence). Additional information was obtained from EMS and sNF.   Level V Caveat.    HPI  Past Medical History Past Medical History:  Diagnosis Date  . Acute bronchitis 04/10/2013  . Anemia   . Angina   . Anxiety   . Arthritis   . Arthritis 10/02/2012  . Asthma   . Chicken pox   . Chronic kidney disease   . Colon polyps   . Dehydration 08/31/2013  . Dementia   . Depression   . Dysrhythmia   . Headache(784.0)   . Heart murmur    . Hypertension   . Hypothyroidism   . Left leg pain 10/02/2012  . Pneumonia   . Recurrent upper respiratory infection (URI)   . Shortness of breath   . UTI (urinary tract infection) 12/30/2012  . Valvular heart disease 09/04/2012   Patient Active Problem List   Diagnosis Date Noted  . Tibial fracture 09/16/2016  . Left fibular fracture 09/16/2016  . Leukocytosis 09/16/2016  . Pressure ulcer 09/20/2015  . Displaced fracture of right femoral neck (HCC) 09/19/2015  . Altered mental status   . Confusion 06/26/2015  . Fall   . Aspiration pneumonia (HCC) 01/10/2015  . Acute encephalopathy 01/08/2015  . UTI (urinary tract infection) 01/08/2015  . 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 Recurrent falls 11/27/2014  . FTT (failure to thrive) in adult 10/26/2014  . Dehydration 10/26/2014  . Abnormal urinalysis 10/26/2014  . Normocytic anemia 10/26/2014  . Lump of skin of left upper extremity 09/07/2014  . Rash and nonspecific skin eruption 05/08/2014  . Syncope and collapse 11/30/2013  . Low back pain, episodic 11/20/2013  . Chronic kidney disease   . Insomnia 08/31/2013  . Arthritis 10/02/2012  . Valvular heart disease 09/04/2012  . Restless leg syndrome 06/06/2012  . Trochanteric bursitis 08/02/2011  . Dementia 08/02/2011  . Leg pain 03/28/2011  . Hypertension 09/22/2010  . Hypothyroidism 09/22/2010   Home Medication(s) Prior to Admission medications   Medication Sig Start  Date End Date Taking? Authorizing Provider  acetaminophen (TYLENOL) 500 MG tablet Take 500 mg by mouth every 4 (four) hours as needed for mild pain, moderate pain, fever or headache.     [provider]  alum & mag hydroxide-simeth (MINTOX) 200-200-20 MG/5ML suspension Take 30 mLs by mouth every 6 (six) hours as needed for indigestion or heartburn.     [provider]  aspirin 81 MG chewable tablet Chew 81 mg by mouth daily with breakfast.     [provider]    cephALEXin (KEFLEX) 500 MG capsule Take 1 capsule (500 mg total) by mouth 3 (three) times daily. Patient not taking: Reported on 12/10/2016 10/30/16   Raeford Razor, MD  clonazePAM (KLONOPIN) 0.5 MG tablet Take 1 tablet (0.5 mg total) by mouth 2 (two) times daily. 09/23/15   Hongalgi, Maximino Greenland, MD  divalproex (DEPAKOTE SPRINKLE) 125 MG capsule Take 125 mg by mouth 3 (three) times daily.     [provider]  escitalopram (LEXAPRO) 5 MG tablet Take 5 mg by mouth daily with breakfast.     [provider]  guaifenesin (ROBITUSSIN) 100 MG/5ML syrup Take 200 mg by mouth every 6 (six) hours as needed for cough.     [provider]  levothyroxine (SYNTHROID, LEVOTHROID) 50 MCG tablet Take 50 mcg by mouth daily before breakfast.     [provider]  loperamide (IMODIUM) 2 MG capsule Take 2 mg by mouth every 3 (three) hours as needed for diarrhea or loose stools.     [provider]  magnesium hydroxide (MILK OF MAGNESIA) 400 MG/5ML suspension Take 30 mLs by mouth at bedtime as needed for mild constipation.    [provider]  Multiple Vitamins-Minerals (MULTIVITAMIN WITH MINERALS) tablet Take 1 tablet by mouth daily with breakfast.     [provider]  Neomycin-Bacitracin-Polymyxin (TRIPLE ANTIBIOTIC) [phone] OINT Apply 1 application topically as needed (for wound care).     [provider]  Nutritional Supplements (NUTRITIONAL DRINK) LIQD Take 1 Bottle by mouth 3 (three) times daily with meals. *Mighty Shakes*    [provider]  nystatin (NYSTATIN) powder Apply topically 2 (two) times daily. Apply under right side abdominal folds until healed    [provider]  risperiDONE (RISPERDAL) 0.5 MG tablet Take 1 tablet by mouth daily as needed (anxiety).  12/16/16   [provider]  rivastigmine (EXELON) 1.5 MG capsule Take 1.5 mg by mouth 2 (two) times daily.    [provider]  senna (SENOKOT) 8.6 MG  TABS tablet Take 17.2 mg by mouth 2 (two) times daily.     [provider]  traMADol (ULTRAM) 50 MG tablet Take 1 tablet (50 mg total) by mouth every 6 (six) hours as needed for severe pain. 09/18/16   Edsel Petrin, DO  traZODone (DESYREL) 50 MG tablet Take 50 mg by mouth at bedtime.     [provider]  Past Surgical History Past Surgical History:  Procedure Laterality Date  . ABDOMINAL HYSTERECTOMY    . APPENDECTOMY    . BACK SURGERY    . CHOLECYSTECTOMY    . COLONOSCOPY W/ POLYPECTOMY    . EYE SURGERY     cataract removed and eye lids lifted  . feet surgery    . FRACTURE SURGERY     bilateral arms  . HAND SURGERY    . HIP ARTHROPLASTY Right 09/20/2015   Procedure: ARTHROPLASTY RIGHT  BIPOLAR ANTERIOR HIP (HEMIARTHROPLASTY);  Surgeon: Samson FredericBrian Swinteck, MD;  Location: [facility];  Service: Orthopedics;  Laterality: Right;  . kidney stones    . SHOULDER ARTHROSCOPY    . TONSILLECTOMY    . TONSILLECTOMY    . TUBAL LIGATION     Family History Family History  Problem Relation Age of Onset  . Heart disease Mother   . Heart disease Father   . Heart disease Other   . Birth defects Other     Social History Social History  Substance Use Topics  . Smoking status: Former Smoker    Packs/day: 0.20    Years: 1.00    Types: Cigarettes    Quit date: 06/02/1941  . Smokeless tobacco: Never Used  . Alcohol use No   Allergies Morphine and related; Codeine; and Hydrocodone  Review of Systems Review of Systems  Unable to perform ROS: Dementia    Physical Exam Vital Signs  I have reviewed the triage vital signs BP (!) 126/50 (BP Location: Right Arm)   Pulse 66   Temp (!) 97.5 F (36.4 C) (Oral)   Resp 12   SpO2 95%   Physical Exam  Constitutional: She appears well-developed and well-nourished. No distress.  HENT:  Head:  Normocephalic and atraumatic.  Nose: Nose normal.  Eyes: Pupils are equal, round, and reactive to light. Conjunctivae and EOM are normal. Right eye exhibits no discharge. Left eye exhibits no discharge. No scleral icterus.  Neck: Normal range of motion. Neck supple.  Cardiovascular: Normal rate and regular rhythm.  Exam reveals no gallop and no friction rub.   No murmur heard. Pulmonary/Chest: Effort normal and breath sounds normal. No stridor. No respiratory distress. She has no rales.  Abdominal: Soft. She exhibits no distension. There is no tenderness.  Musculoskeletal: She exhibits no edema or tenderness.  Neurological:  Sleepy but arousable to loud verbal stimuli.  Patient withdraws to pain, localizing.  Moves all 4 extremities.  Skin: Skin is warm and dry. No rash noted. She is not diaphoretic. No erythema.  Psychiatric: She has a normal mood and affect.  Vitals reviewed.   ED Results and Treatments Labs (all labs ordered are listed, but only abnormal results are displayed) Labs Reviewed  COMPREHENSIVE METABOLIC PANEL - Abnormal; Notable for the following:       Result Value   Total Protein 5.9 (*)    Albumin 3.1 (*)    GFR calc non Af Amer 56 (*)    All other components within normal limits  CBC WITH DIFFERENTIAL/PLATELET - Abnormal; Notable for the following:    Hemoglobin 11.8 (*)    All other components within normal limits  URINALYSIS, COMPLETE (UACMP) WITH MICROSCOPIC - Abnormal; Notable for the following:    Leukocytes, UA TRACE (*)    Bacteria, UA RARE (*)    All other components within normal limits  I-STAT VENOUS BLOOD GAS, ED - Abnormal; Notable for the following:    Bicarbonate 33.9 (*)  TCO2 36 (*)    Acid-Base Excess 8.0 (*)    All other components within normal limits  RAPID URINE DRUG SCREEN, HOSP PERFORMED  CBG MONITORING, ED  CBG MONITORING, ED                                                                                                                          EKG  EKG Interpretation  Date/Time:  Friday March 27 2017 11:18:07 EDT Ventricular Rate:  67 PR Interval:    QRS Duration: 155 QT Interval:  466 QTC Calculation: 492 R Axis:   -68 Text Interpretation:  Sinus rhythm Prolonged PR interval RBBB and LAFB No significant change since last tracing Confirmed by Drema Pry [phone]) on 03/27/2017 11:27:44 AM      Radiology Ct Head Wo Contrast  Result Date: 03/27/2017 CLINICAL DATA:  Recent fall, altered level of consciousness EXAM: CT HEAD WITHOUT CONTRAST TECHNIQUE: Contiguous axial images were obtained from the base of the skull through the vertex without intravenous contrast. COMPARISON:  03/25/2017 FINDINGS: Brain: Mild atrophic and ischemic changes are noted similar to that seen on the prior exam. No findings to suggest acute hemorrhage, acute infarction or space-occupying mass lesion is noted. Vascular: No hyperdense vessel or unexpected calcification. Skull: Normal. Negative for fracture or focal lesion. Sinuses/Orbits: No acute finding. Other: None IMPRESSION: Chronic atrophic and ischemic changes without acute abnormality. Electronically Signed   By: Alcide Clever M.D.   On: 03/27/2017 12:52   Pertinent labs & imaging results that were available during my care of the patient were reviewed by me and considered in my medical decision making (see chart for details).  Medications Ordered in ED Medications  naloxone (NARCAN) injection 0.4 mg (0.4 mg Intravenous Given 03/27/17 1133)                                                                                                                                    Procedures Procedures  (including critical care time)  Medical Decision Making / ED Course I have reviewed the nursing notes for this encounter and the patient's prior records (if available in EHR or on provided paperwork).    Workup did not reveal any infectious symptoms such as urinary tract infection.  CT head  out acute process.  Labs grossly reassuring.  Patient slowly became more alert throughout her stay in the emergency department.  I believe  this is likely secondary to medication side effect, possibly sedation from Xanax.  Patient appears to be back at her baseline mental status.  There are no focal neurologic deficits noted on my exam following her improvement in mental status.  Feel that discharge back to her skilled nursing facility is appropriate.  The patient is safe for discharge with strict return precautions.  Final Clinical Impression(s) / ED Diagnoses Final diagnoses:  Somnolence   Disposition: Discharge  Condition: Good  I have discussed the results, Dx and Tx plan with the patient who expressed understanding and agree(s) with the plan. Discharge instructions discussed at great length. The patient was given strict return precautions who verbalized understanding of the instructions. No further questions at time of discharge.    New Prescriptions   No medications on file    Follow Up: Ron Parker, MD [address] [place] [address] [phone]  Schedule an appointment as soon as possible for a visit        This chart was dictated using voice recognition software.  Despite best efforts to proofread,  errors can occur which can change the documentation meaning.   Nira Conn, MD 03/27/17 [phone]

## 2017-03-27 NOTE — ED Notes (Signed)
[facility], pt given aspirin, clonazepam, divalproex, escitalopram and levothyroxine this am.

## 2017-03-28 DIAGNOSIS — G0430 Acute necrotizing hemorrhagic encephalopathy, unspecified: Secondary | ICD-10-CM | POA: Diagnosis not present

## 2017-03-28 IMAGING — CR DG CHEST 1V PORT
1 series · 1 of 1 positions shown · non-contrast
Comparison: 09/20/2015

CLINICAL DATA: Altered mental status

EXAM:
PORTABLE CHEST 1 VIEW

[AP]
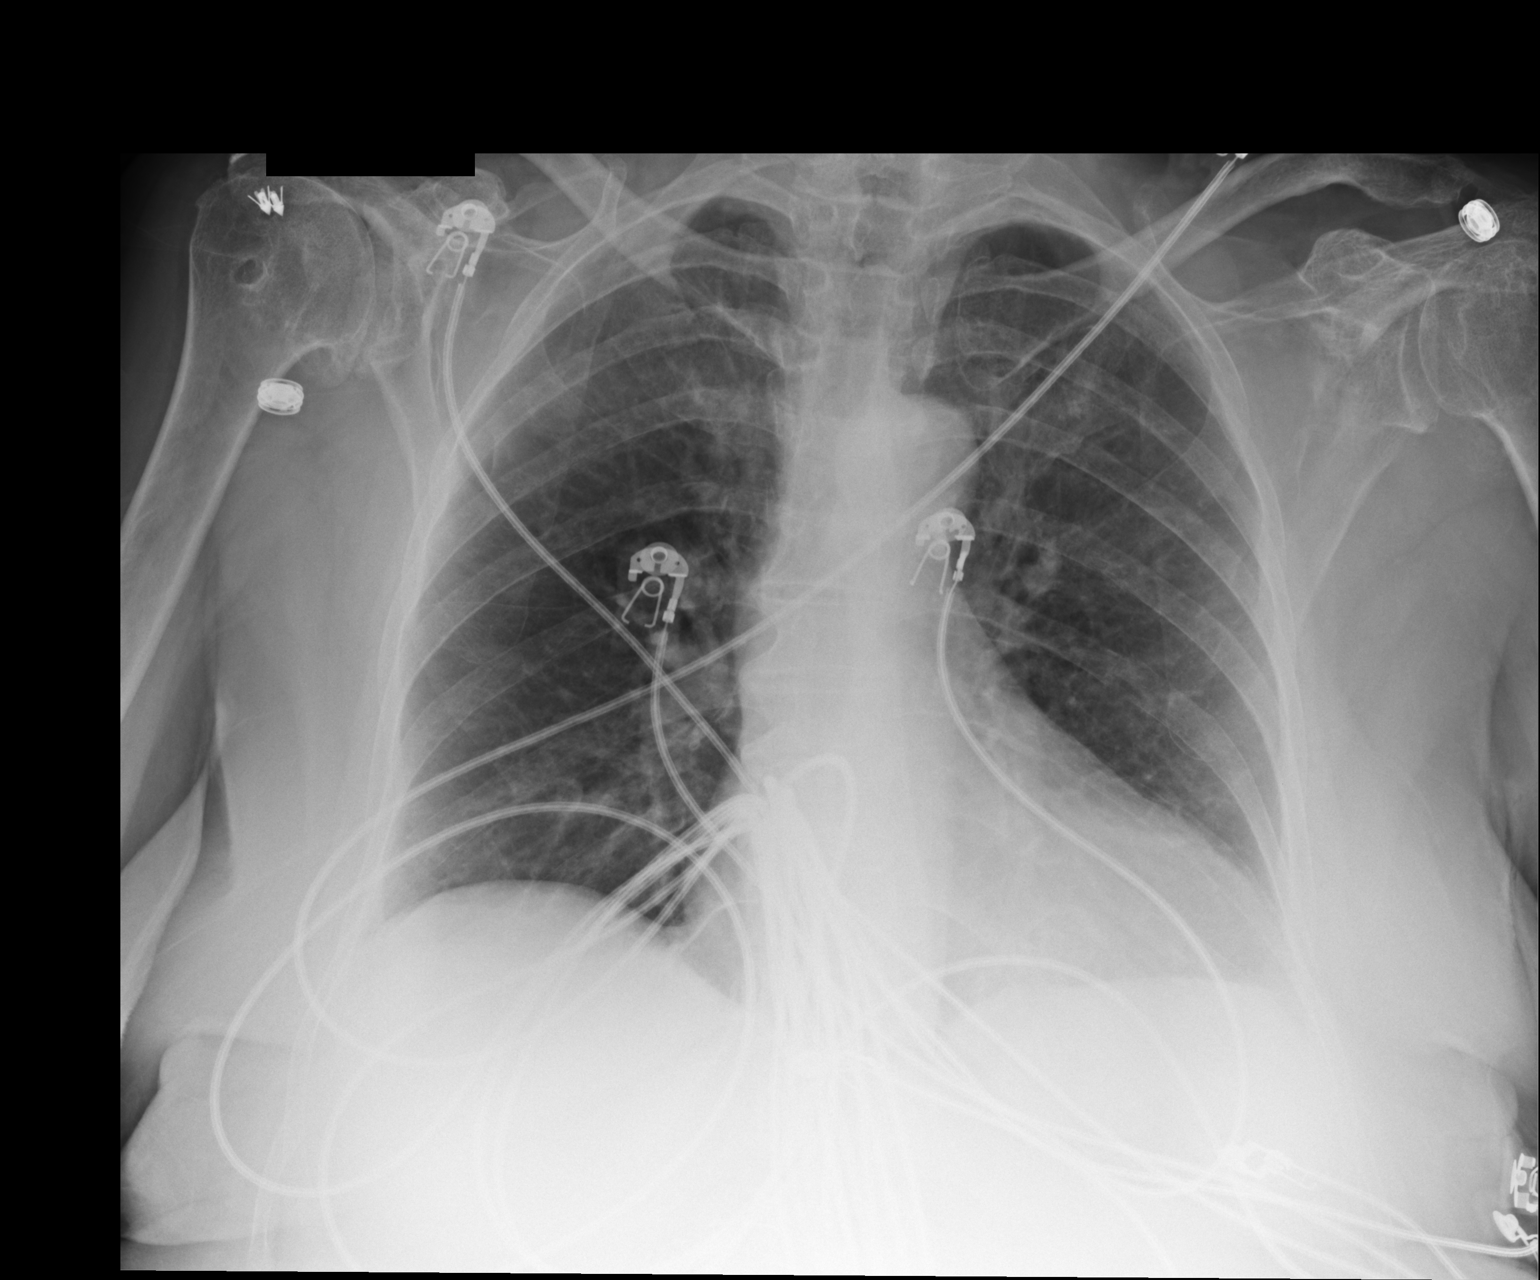

[1 of 1 positions shown; findings below may reference images not displayed]

FINDINGS: Cardiac shadow is mildly enlarged but stable. The lungs are clear
bilaterally. No acute bony abnormality is seen.
IMPRESSION: No active disease.

## 2017-03-29 DIAGNOSIS — G0430 Acute necrotizing hemorrhagic encephalopathy, unspecified: Secondary | ICD-10-CM | POA: Diagnosis not present

## 2017-03-30 DIAGNOSIS — G0430 Acute necrotizing hemorrhagic encephalopathy, unspecified: Secondary | ICD-10-CM | POA: Diagnosis not present

## 2017-03-31 DIAGNOSIS — G0430 Acute necrotizing hemorrhagic encephalopathy, unspecified: Secondary | ICD-10-CM | POA: Diagnosis not present

## 2017-03-31 DIAGNOSIS — G309 Alzheimer's disease, unspecified: Secondary | ICD-10-CM | POA: Diagnosis not present

## 2017-03-31 DIAGNOSIS — R079 Chest pain, unspecified: Secondary | ICD-10-CM | POA: Diagnosis not present

## 2017-03-31 DIAGNOSIS — F0281 Dementia in other diseases classified elsewhere with behavioral disturbance: Secondary | ICD-10-CM | POA: Diagnosis not present

## 2017-04-01 DIAGNOSIS — G0430 Acute necrotizing hemorrhagic encephalopathy, unspecified: Secondary | ICD-10-CM | POA: Diagnosis not present

## 2017-04-02 DIAGNOSIS — G0430 Acute necrotizing hemorrhagic encephalopathy, unspecified: Secondary | ICD-10-CM | POA: Diagnosis not present

## 2017-04-03 DIAGNOSIS — G0430 Acute necrotizing hemorrhagic encephalopathy, unspecified: Secondary | ICD-10-CM | POA: Diagnosis not present

## 2017-04-04 DIAGNOSIS — G0430 Acute necrotizing hemorrhagic encephalopathy, unspecified: Secondary | ICD-10-CM | POA: Diagnosis not present

## 2017-04-05 DIAGNOSIS — G0430 Acute necrotizing hemorrhagic encephalopathy, unspecified: Secondary | ICD-10-CM | POA: Diagnosis not present

## 2017-04-06 DIAGNOSIS — G0430 Acute necrotizing hemorrhagic encephalopathy, unspecified: Secondary | ICD-10-CM | POA: Diagnosis not present

## 2017-04-07 DIAGNOSIS — G0430 Acute necrotizing hemorrhagic encephalopathy, unspecified: Secondary | ICD-10-CM | POA: Diagnosis not present

## 2017-04-08 DIAGNOSIS — G0430 Acute necrotizing hemorrhagic encephalopathy, unspecified: Secondary | ICD-10-CM | POA: Diagnosis not present

## 2017-04-09 DIAGNOSIS — G0430 Acute necrotizing hemorrhagic encephalopathy, unspecified: Secondary | ICD-10-CM | POA: Diagnosis not present

## 2017-04-10 DIAGNOSIS — G0430 Acute necrotizing hemorrhagic encephalopathy, unspecified: Secondary | ICD-10-CM | POA: Diagnosis not present

## 2017-04-11 DIAGNOSIS — G0430 Acute necrotizing hemorrhagic encephalopathy, unspecified: Secondary | ICD-10-CM | POA: Diagnosis not present

## 2017-04-12 DIAGNOSIS — G0430 Acute necrotizing hemorrhagic encephalopathy, unspecified: Secondary | ICD-10-CM | POA: Diagnosis not present

## 2017-04-13 ENCOUNTER — Emergency Department (HOSPITAL_COMMUNITY): Payer: Medicare HMO

## 2017-04-13 ENCOUNTER — Encounter (HOSPITAL_COMMUNITY): Payer: Self-pay | Admitting: Neurology

## 2017-04-13 ENCOUNTER — Emergency Department (HOSPITAL_COMMUNITY)
Admission: EM | Admit: 2017-04-13 | Discharge: 2017-04-13 | Disposition: A | Discharge status: Home / Self Care | Payer: Medicare HMO | Attending: Emergency Medicine | Admitting: Emergency Medicine

## 2017-04-13 DIAGNOSIS — R402441 Other coma, without documented Glasgow coma scale score, or with partial score reported, in the field [EMT or ambulance]: Secondary | ICD-10-CM | POA: Diagnosis not present

## 2017-04-13 DIAGNOSIS — G309 Alzheimer's disease, unspecified: Secondary | ICD-10-CM | POA: Insufficient documentation

## 2017-04-13 DIAGNOSIS — J45909 Unspecified asthma, uncomplicated: Secondary | ICD-10-CM | POA: Insufficient documentation

## 2017-04-13 DIAGNOSIS — N189 Chronic kidney disease, unspecified: Secondary | ICD-10-CM | POA: Insufficient documentation

## 2017-04-13 DIAGNOSIS — R404 Transient alteration of awareness: Secondary | ICD-10-CM | POA: Diagnosis not present

## 2017-04-13 DIAGNOSIS — E039 Hypothyroidism, unspecified: Secondary | ICD-10-CM | POA: Insufficient documentation

## 2017-04-13 DIAGNOSIS — Z7982 Long term (current) use of aspirin: Secondary | ICD-10-CM | POA: Diagnosis not present

## 2017-04-13 DIAGNOSIS — G0430 Acute necrotizing hemorrhagic encephalopathy, unspecified: Secondary | ICD-10-CM | POA: Diagnosis not present

## 2017-04-13 DIAGNOSIS — Z79899 Other long term (current) drug therapy: Secondary | ICD-10-CM | POA: Insufficient documentation

## 2017-04-13 DIAGNOSIS — Z87891 Personal history of nicotine dependence: Secondary | ICD-10-CM | POA: Diagnosis not present

## 2017-04-13 DIAGNOSIS — R4182 Altered mental status, unspecified: Secondary | ICD-10-CM | POA: Diagnosis present

## 2017-04-13 DIAGNOSIS — I6789 Other cerebrovascular disease: Secondary | ICD-10-CM | POA: Diagnosis not present

## 2017-04-13 DIAGNOSIS — I129 Hypertensive chronic kidney disease with stage 1 through stage 4 chronic kidney disease, or unspecified chronic kidney disease: Secondary | ICD-10-CM | POA: Diagnosis not present

## 2017-04-13 DIAGNOSIS — F0280 Dementia in other diseases classified elsewhere without behavioral disturbance: Secondary | ICD-10-CM | POA: Insufficient documentation

## 2017-04-13 DIAGNOSIS — I517 Cardiomegaly: Secondary | ICD-10-CM | POA: Diagnosis not present

## 2017-04-13 DIAGNOSIS — R4020 Unspecified coma: Secondary | ICD-10-CM | POA: Diagnosis not present

## 2017-04-13 LAB — I-STAT CHEM 8, ED
BUN: 22 mg/dL — ABNORMAL HIGH (ref 6–20)
CALCIUM ION: 1.19 mmol/L (ref 1.15–1.40)
CREATININE: 0.9 mg/dL (ref 0.44–1.00)
Chloride: 104 mmol/L (ref 101–111)
Glucose, Bld: 98 mg/dL (ref 65–99)
HEMATOCRIT: 33 % — AB (ref 36.0–46.0)
Hemoglobin: 11.2 g/dL — ABNORMAL LOW (ref 12.0–15.0)
Potassium: 4.4 mmol/L (ref 3.5–5.1)
SODIUM: 141 mmol/L (ref 135–145)
TCO2: 30 mmol/L (ref 22–32)

## 2017-04-13 LAB — COMPREHENSIVE METABOLIC PANEL
ALBUMIN: 3.2 g/dL — AB (ref 3.5–5.0)
ALK PHOS: 75 U/L (ref 38–126)
ALT: 18 U/L (ref 14–54)
ANION GAP: 5 (ref 5–15)
AST: 19 U/L (ref 15–41)
BILIRUBIN TOTAL: 0.4 mg/dL (ref 0.3–1.2)
BUN: 20 mg/dL (ref 6–20)
CALCIUM: 9 mg/dL (ref 8.9–10.3)
CO2: 30 mmol/L (ref 22–32)
Chloride: 106 mmol/L (ref 101–111)
Creatinine, Ser: 0.89 mg/dL (ref 0.44–1.00)
GFR calc Af Amer: >60 mL/min (ref >60)
GFR, EST NON AFRICAN AMERICAN: 54 mL/min — AB (ref >60)
GLUCOSE: 98 mg/dL (ref 65–99)
Potassium: 4.5 mmol/L (ref 3.5–5.1)
Sodium: 141 mmol/L (ref 135–145)
TOTAL PROTEIN: 6 g/dL — AB (ref 6.5–8.1)

## 2017-04-13 LAB — I-STAT VENOUS BLOOD GAS, ED
ACID-BASE EXCESS: 7 mmol/L — AB (ref 0.0–2.0)
BICARBONATE: 34 mmol/L — AB (ref 20.0–28.0)
O2 SAT: 28 %
PO2 VEN: 20 mmHg — AB (ref 32.0–45.0)
Patient temperature: 98.1
TCO2: 36 mmol/L — AB (ref 22–32)
pCO2, Ven: 62.7 mmHg — ABNORMAL HIGH (ref 44.0–60.0)
pH, Ven: 7.341 (ref 7.250–7.430)

## 2017-04-13 LAB — CBC WITH DIFFERENTIAL/PLATELET
BASOS PCT: 0 %
Basophils Absolute: 0 10*3/uL (ref 0.0–0.1)
EOS ABS: 0.1 10*3/uL (ref 0.0–0.7)
EOS PCT: 1 %
HCT: 35.9 % — ABNORMAL LOW (ref 36.0–46.0)
Hemoglobin: 11.5 g/dL — ABNORMAL LOW (ref 12.0–15.0)
LYMPHS ABS: 2.2 10*3/uL (ref 0.7–4.0)
Lymphocytes Relative: 23 %
MCH: 28.8 pg (ref 26.0–34.0)
MCHC: 32 g/dL (ref 30.0–36.0)
MCV: 90 fL (ref 78.0–100.0)
MONOS PCT: 6 %
Monocytes Absolute: 0.6 10*3/uL (ref 0.1–1.0)
Neutro Abs: 6.7 10*3/uL (ref 1.7–7.7)
Neutrophils Relative %: 70 %
PLATELETS: 223 10*3/uL (ref 150–400)
RBC: 3.99 MIL/uL (ref 3.87–5.11)
RDW: 13.8 % (ref 11.5–15.5)
WBC: 9.6 10*3/uL (ref 4.0–10.5)

## 2017-04-13 LAB — URINALYSIS, COMPLETE (UACMP) WITH MICROSCOPIC
Bilirubin Urine: NEGATIVE
GLUCOSE, UA: NEGATIVE mg/dL
Hgb urine dipstick: NEGATIVE
Ketones, ur: NEGATIVE mg/dL
Nitrite: NEGATIVE
PH: 5 (ref 5.0–8.0)
PROTEIN: NEGATIVE mg/dL
SPECIFIC GRAVITY, URINE: 1.017 (ref 1.005–1.030)

## 2017-04-13 LAB — CBG MONITORING, ED: GLUCOSE-CAPILLARY: 93 mg/dL (ref 65–99)

## 2017-04-13 LAB — I-STAT CG4 LACTIC ACID, ED: LACTIC ACID, VENOUS: 1.34 mmol/L (ref 0.5–1.9)

## 2017-04-13 LAB — ETHANOL: Alcohol, Ethyl (B): <10 mg/dL (ref <10)

## 2017-04-13 LAB — AMMONIA: Ammonia: 15 umol/L (ref 9–35)

## 2017-04-13 NOTE — ED Notes (Signed)
Pt family at bedside, is more alert. Laughing, smiling and talking.

## 2017-04-13 NOTE — ED Notes (Signed)
I Stat Venous Blood Gas results shown to Dr. Melene Planan Floyd

## 2017-04-13 NOTE — ED Triage Notes (Signed)
Per ems- pt some from [facility]. Staff reports she took meds at 9 am, after eating, she was unresponsive. They called b/c she continued to be alerted, and drowsy. She responds to verbal. CBG 129. Initial BP 260/120, EMS reports 134/88, HR 88 regular. 20 R. AC. Has history of same episode after her medications. Has DNR.

## 2017-04-13 NOTE — ED Provider Notes (Signed)
[facility]   CSN: [id number] Arrival date & time: 04/13/17  1233     History   Chief Complaint Chief Complaint  Patient presents with  . Altered Mental Status    HPI Deborah CorpusStella P Birkhead is a 83 y.o. female.  83 yo F with a chief complaint of transient confusion.  Per the nursing home the patient became somnolent after getting her morning pain medicine.  This lasted for about an hour and then resolve spontaneously.  The patient is unsure exactly what happened.  She denies any cough congestion fevers chills myalgias abdominal pain nausea vomiting or diarrhea.  Level 5 caveat dementia.   The history is provided by the patient.  Altered Mental Status   This is a new problem. The current episode started less than 1 hour ago. The problem has been resolved. Associated symptoms include somnolence.  Illness  This is a new problem. The current episode started less than 1 hour ago. The problem occurs constantly. The problem has been resolved. Pertinent negatives include no chest pain, no headaches and no shortness of breath. Nothing aggravates the symptoms. Nothing relieves the symptoms. She has tried nothing for the symptoms. The treatment provided no relief.    Past Medical History:  Diagnosis Date  . Acute bronchitis 04/10/2013  . Anemia   . Angina   . Anxiety   . Arthritis   . Arthritis 10/02/2012  . Asthma   . Chicken pox   . Chronic kidney disease   . Colon polyps   . Dehydration 08/31/2013  . Dementia   . Depression   . Dysrhythmia   . Headache(784.0)   . Heart murmur   . Hypertension   . Hypothyroidism   . Left leg pain 10/02/2012  . Pneumonia   . Recurrent upper respiratory infection (URI)   . Shortness of breath   . UTI (urinary tract infection) 12/30/2012  . Valvular heart disease 09/04/2012    Patient Active Problem List   Diagnosis Date Noted  . Tibial fracture 09/16/2016  . Left fibular fracture 09/16/2016  .  Leukocytosis 09/16/2016  . Pressure ulcer 09/20/2015  . Displaced fracture of right femoral neck (HCC) 09/19/2015  . Altered mental status   . Confusion 06/26/2015  . Fall   . Aspiration pneumonia (HCC) 01/10/2015  . Acute encephalopathy 01/08/2015  . UTI (urinary tract infection) 01/08/2015  . 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 Recurrent falls 11/27/2014  . FTT (failure to thrive) in adult 10/26/2014  . Dehydration 10/26/2014  . Abnormal urinalysis 10/26/2014  . Normocytic anemia 10/26/2014  . Lump of skin of left upper extremity 09/07/2014  . Rash and nonspecific skin eruption 05/08/2014  . Syncope and collapse 11/30/2013  . Low back pain, episodic 11/20/2013  . Chronic kidney disease   . Insomnia 08/31/2013  . Arthritis 10/02/2012  . Valvular heart disease 09/04/2012  . Restless leg syndrome 06/06/2012  . Trochanteric bursitis 08/02/2011  . Dementia 08/02/2011  . Leg pain 03/28/2011  . Hypertension 09/22/2010  . Hypothyroidism 09/22/2010    Past Surgical History:  Procedure Laterality Date  . ABDOMINAL HYSTERECTOMY    . APPENDECTOMY    . BACK SURGERY    . CHOLECYSTECTOMY    . COLONOSCOPY W/ POLYPECTOMY    . EYE SURGERY     cataract removed and eye lids lifted  . feet surgery    . FRACTURE SURGERY     bilateral arms  . HAND  SURGERY    . kidney stones    . SHOULDER ARTHROSCOPY    . TONSILLECTOMY    . TONSILLECTOMY    . TUBAL LIGATION      OB History    No data available       Home Medications    Prior to Admission medications   Medication Sig Start Date End Date Taking? Authorizing Provider  acetaminophen (TYLENOL) 500 MG tablet Take 500 mg by mouth every 4 (four) hours as needed for mild pain, moderate pain, fever or headache.    Yes [provider]  alum & mag hydroxide-simeth (MINTOX) 200-200-20 MG/5ML suspension Take 30 mLs by mouth every 6 (six) hours as needed for indigestion or heartburn.    Yes [provider]  aspirin 81 MG chewable tablet Chew 81 mg daily by mouth.    Yes [provider]  clonazePAM (KLONOPIN) 0.5 MG tablet Take 1 tablet (0.5 mg total) by mouth 2 (two) times daily. Patient taking differently: Take 0.25 mg 2 (two) times daily by mouth.  09/23/15  Yes Hongalgi, Maximino GreenlandAnand D, MD  divalproex (DEPAKOTE SPRINKLE) 125 MG capsule Take 125 mg by mouth 3 (three) times daily.    Yes [provider]  escitalopram (LEXAPRO) 5 MG tablet Take 5 mg daily by mouth.    Yes [provider]  guaifenesin (ROBITUSSIN) 100 MG/5ML syrup Take 200 mg by mouth every 6 (six) hours as needed for cough.    Yes [provider]  levothyroxine (SYNTHROID, LEVOTHROID) 50 MCG tablet Take 50 mcg by mouth daily before breakfast.    Yes [provider]  loperamide (IMODIUM) 2 MG capsule Take 2 mg by mouth every 3 (three) hours as needed for diarrhea or loose stools.    Yes [provider]  magnesium hydroxide (MILK OF MAGNESIA) 400 MG/5ML suspension Take 30 mLs by mouth at bedtime as needed for mild constipation.   Yes [provider]  Multiple Vitamins-Minerals (MULTIVITAMIN WITH MINERALS) tablet Take 1 tablet daily with breakfast by mouth. "Akorn/s antioxidant"   Yes [provider]  Neomycin-Bacitracin-Polymyxin (TRIPLE ANTIBIOTIC) [id number] OINT Apply 1 application 3 (three) times daily as needed topically (skin tears/abrasians).    Yes [provider]  Nutritional Supplements (NUTRITIONAL DRINK) LIQD Take 1 Bottle by mouth 3 (three) times daily with meals. *Mighty Shakes*   Yes [provider]  nystatin (NYSTATIN) powder Apply See admin instructions topically. Cleanse under right side abdominal folds with soap and water. Then apply nystop twice daily until healed   Yes [provider]  risperiDONE (RISPERDAL) 0.5 MG tablet Take 0.5 mg daily as needed by mouth (severe agitation/anxiety).  12/16/16  Yes [provider]  rivastigmine (EXELON) 1.5 MG capsule Take 1.5 mg by mouth 2 (two) times daily.   Yes [provider]  senna (SENOKOT) 8.6 MG TABS tablet Take 17.2 mg by mouth 2 (two) times daily.    Yes [provider]  traMADol (ULTRAM) 50 MG tablet Take 1 tablet (50 mg total) by mouth every 6 (six) hours as needed for severe pain. 09/18/16  Yes Mikhail, Nita SellsMaryann, DO  traZODone (DESYREL) 50 MG tablet Take 50 mg by mouth at bedtime.    Yes [provider]    Family History Family History  Problem Relation Age of Onset  . Heart disease Mother   . Heart disease Father   . Heart disease Other   . Birth defects Other     Social History Social  History   Tobacco Use  . Smoking status: Former Smoker    Packs/day: 0.20    Years: 1.00    Pack years: 0.20    Types: Cigarettes    Last attempt to quit: 06/02/1941    Years since quitting: 75.9  . Smokeless tobacco: Never Used  Substance Use Topics  . Alcohol use: No  . Drug use: No     Allergies   Morphine and related; Codeine; and Hydrocodone   Review of Systems Review of Systems  Constitutional: Positive for activity change. Negative for chills and fever.  HENT: Negative for congestion and rhinorrhea.   Eyes: Negative for redness and visual disturbance.  Respiratory: Negative for shortness of breath and wheezing.   Cardiovascular: Negative for chest pain and palpitations.  Gastrointestinal: Negative for nausea and vomiting.  Genitourinary: Negative for dysuria and urgency.  Musculoskeletal: Negative for arthralgias and myalgias.  Skin: Negative for pallor and wound.  Neurological: Negative for dizziness and headaches.     Physical Exam Updated Vital Signs BP (!) 160/63 (BP Location: Left Arm)   Pulse 60   Temp 97.7 F (36.5 C) (Oral)   Resp 16   SpO2 100%   Physical Exam  Constitutional: She is oriented to person, place, and time. She appears well-developed and well-nourished. No distress.    HENT:  Head: Normocephalic and atraumatic.  Eyes: EOM are normal. Pupils are equal, round, and reactive to light.  Neck: Normal range of motion. Neck supple.  Cardiovascular: Normal rate and regular rhythm. Exam reveals no gallop and no friction rub.  No murmur heard. Pulmonary/Chest: Effort normal. She has no wheezes. She has no rales.  Abdominal: Soft. She exhibits no distension and no mass. There is no tenderness. There is no guarding.  Musculoskeletal: She exhibits no edema or tenderness.  Neurological: She is alert and oriented to person, place, and time.  Skin: Skin is warm and dry. She is not diaphoretic.  Psychiatric: She has a normal mood and affect. Her behavior is normal.  Nursing note and vitals reviewed.    ED Treatments / Results  Labs (all labs ordered are listed, but only abnormal results are displayed) Labs Reviewed  COMPREHENSIVE METABOLIC PANEL - Abnormal; Notable for the following components:      Result Value   Total Protein 6.0 (*)    Albumin 3.2 (*)    GFR calc non Af Amer 54 (*)    All other components within normal limits  CBC WITH DIFFERENTIAL/PLATELET - Abnormal; Notable for the following components:   Hemoglobin 11.5 (*)    HCT 35.9 (*)    All other components within normal limits  URINALYSIS, COMPLETE (UACMP) WITH MICROSCOPIC - Abnormal; Notable for the following components:   APPearance HAZY (*)    Leukocytes, UA SMALL (*)    Bacteria, UA RARE (*)    Squamous Epithelial / LPF 0-5 (*)    All other components within normal limits  I-STAT CHEM 8, ED - Abnormal; Notable for the following components:   BUN 22 (*)    Hemoglobin 11.2 (*)    HCT 33.0 (*)    All other components within normal limits  I-STAT VENOUS BLOOD GAS, ED - Abnormal; Notable for the following components:   pCO2, Ven 62.7 (*)    pO2, Ven 20.0 (*)    Bicarbonate 34.0 (*)    TCO2 36 (*)    Acid-Base Excess 7.0 (*)    All other components within normal limits  URINE CULTURE  AMMONIA  ETHANOL  BLOOD GAS, VENOUS  CBG MONITORING, ED  I-STAT CG4 LACTIC ACID, ED    EKG  EKG Interpretation None       Radiology Ct Head Wo Contrast  Result Date: 04/13/2017 CLINICAL DATA:  Altered level of consciousness. EXAM: CT HEAD WITHOUT CONTRAST TECHNIQUE: Contiguous axial images were obtained from the base of the skull through the vertex without intravenous contrast. COMPARISON:  CT scan of March 27, 2017. FINDINGS: Brain: Mild diffuse cortical atrophy is noted. Mild chronic ischemic white matter disease is noted. No mass effect or midline shift is noted. Ventricular size is within normal limits. There is no evidence of mass lesion, hemorrhage or acute infarction. Vascular: No hyperdense vessel or unexpected calcification. Skull: Normal. Negative for fracture or focal lesion. Sinuses/Orbits: No acute finding. Other: None. IMPRESSION: Mild diffuse cortical atrophy. Mild chronic ischemic white matter disease. No acute intracranial abnormality seen. Electronically Signed   By: Lupita Raider, M.D.   On: 04/13/2017 15:26   Dg Chest Port 1 View  Result Date: 04/13/2017 CLINICAL DATA:  After eating, patient became unresponsive. EXAM: PORTABLE CHEST 1 VIEW COMPARISON:  03/25/2017. FINDINGS: Mild cardiac enlargement. No consolidation or edema. No pneumothorax. Osteopenia with degenerative change both shoulders. IMPRESSION: Cardiomegaly similar to priors. No acute chest findings are evident. Electronically Signed   By: Elsie Stain M.D.   On: 04/13/2017 13:00    Procedures Procedures (including critical care time)  Medications Ordered in ED Medications - No data to display   Initial Impression / Assessment and Plan / ED Course  I have reviewed the triage vital signs and the nursing notes.  Pertinent labs & imaging results that were available during my care of the patient were reviewed by me and considered in my medical decision making (see chart for details).     83 yo  F with a chief complaint of somnolence.  This occurred after she took her home pain medicine.  The patient currently is back to her baseline.  Denies any complaints.  Will obtain an altered mental status workup. Unremarkable.  Improved while in the ED.  D/c home.   7:10 AM:  I have discussed the diagnosis/risks/treatment options with the patient and family and believe the pt to be eligible for discharge home to follow-up with PCP. We also discussed returning to the ED immediately if new or worsening sx occur. We discussed the sx which are most concerning (e.g., sudden worsening pain, fever, inability to tolerate by mouth) that necessitate immediate return. Medications administered to the patient during their visit and any new prescriptions provided to the patient are listed below.  Medications given during this visit Medications - No data to display   The patient appears reasonably screen and/or stabilized for discharge and I doubt any other medical condition or other [facility] requiring further screening, evaluation, or treatment in the ED at this time prior to discharge.      Final Clinical Impressions(s) / ED Diagnoses   Final diagnoses:  Transient alteration of awareness    ED Discharge Orders    None       Melene Plan, DO 04/14/17 0710

## 2017-04-14 DIAGNOSIS — G0430 Acute necrotizing hemorrhagic encephalopathy, unspecified: Secondary | ICD-10-CM | POA: Diagnosis not present

## 2017-04-15 DIAGNOSIS — G0430 Acute necrotizing hemorrhagic encephalopathy, unspecified: Secondary | ICD-10-CM | POA: Diagnosis not present

## 2017-04-15 LAB — URINE CULTURE: Culture: >=100000 — AB

## 2017-04-16 ENCOUNTER — Telehealth (HOSPITAL_BASED_OUTPATIENT_CLINIC_OR_DEPARTMENT_OTHER): Payer: Self-pay

## 2017-04-16 DIAGNOSIS — G0430 Acute necrotizing hemorrhagic encephalopathy, unspecified: Secondary | ICD-10-CM | POA: Diagnosis not present

## 2017-04-16 NOTE — Telephone Encounter (Signed)
Post ED Visit - Positive Culture Follow-up  Culture report reviewed by antimicrobial stewardship pharmacist:  []  Enzo BiNathan Batchelder, Pharm.D. []  Celedonio MiyamotoJeremy Frens, [address]Ph[address]D., BCPS AQ-ID []  Garvin FilaMike Maccia, Pharm.D., BCPS []  Georgina PillionElizabeth Martin, Pharm.D., BCPS []  ConfluenceMinh Pham, [address]Ph[address]D., BCPS, AAHIVP []  Estella HuskMichelle Turner, Pharm.D., BCPS, AAHIVP []  Lysle Pearlachel Rumbarger, PharmD, BCPS []  Casilda Carlsaylor Stone, PharmD, BCPS []  Pollyann SamplesAndy Johnston, PharmD, BCPS X  Al CorpusLindsey Foltanski, RPh   Positive urine culture, >/= 100,000 colonies -> Aerococcus Urinae  No further Treatment  Arvid RightClark, Crystelle Ferrufino Dorn 04/16/2017, 2:42 PM

## 2017-04-17 DIAGNOSIS — G0430 Acute necrotizing hemorrhagic encephalopathy, unspecified: Secondary | ICD-10-CM | POA: Diagnosis not present

## 2017-04-18 DIAGNOSIS — G0430 Acute necrotizing hemorrhagic encephalopathy, unspecified: Secondary | ICD-10-CM | POA: Diagnosis not present

## 2017-04-19 DIAGNOSIS — G0430 Acute necrotizing hemorrhagic encephalopathy, unspecified: Secondary | ICD-10-CM | POA: Diagnosis not present

## 2017-04-20 DIAGNOSIS — G309 Alzheimer's disease, unspecified: Secondary | ICD-10-CM | POA: Diagnosis not present

## 2017-04-20 DIAGNOSIS — F0281 Dementia in other diseases classified elsewhere with behavioral disturbance: Secondary | ICD-10-CM | POA: Diagnosis not present

## 2017-04-20 DIAGNOSIS — G0430 Acute necrotizing hemorrhagic encephalopathy, unspecified: Secondary | ICD-10-CM | POA: Diagnosis not present

## 2017-04-20 DIAGNOSIS — R4189 Other symptoms and signs involving cognitive functions and awareness: Secondary | ICD-10-CM | POA: Diagnosis not present

## 2017-04-21 DIAGNOSIS — G0430 Acute necrotizing hemorrhagic encephalopathy, unspecified: Secondary | ICD-10-CM | POA: Diagnosis not present

## 2017-04-22 DIAGNOSIS — G0430 Acute necrotizing hemorrhagic encephalopathy, unspecified: Secondary | ICD-10-CM | POA: Diagnosis not present

## 2017-04-23 DIAGNOSIS — G0430 Acute necrotizing hemorrhagic encephalopathy, unspecified: Secondary | ICD-10-CM | POA: Diagnosis not present

## 2017-04-24 DIAGNOSIS — G0430 Acute necrotizing hemorrhagic encephalopathy, unspecified: Secondary | ICD-10-CM | POA: Diagnosis not present

## 2017-04-25 DIAGNOSIS — G0430 Acute necrotizing hemorrhagic encephalopathy, unspecified: Secondary | ICD-10-CM | POA: Diagnosis not present

## 2017-04-26 DIAGNOSIS — G0430 Acute necrotizing hemorrhagic encephalopathy, unspecified: Secondary | ICD-10-CM | POA: Diagnosis not present

## 2017-04-27 DIAGNOSIS — G0430 Acute necrotizing hemorrhagic encephalopathy, unspecified: Secondary | ICD-10-CM | POA: Diagnosis not present

## 2017-04-28 DIAGNOSIS — G0430 Acute necrotizing hemorrhagic encephalopathy, unspecified: Secondary | ICD-10-CM | POA: Diagnosis not present

## 2017-04-29 DIAGNOSIS — G0430 Acute necrotizing hemorrhagic encephalopathy, unspecified: Secondary | ICD-10-CM | POA: Diagnosis not present

## 2017-04-30 DIAGNOSIS — G0430 Acute necrotizing hemorrhagic encephalopathy, unspecified: Secondary | ICD-10-CM | POA: Diagnosis not present

## 2017-05-01 DIAGNOSIS — G0430 Acute necrotizing hemorrhagic encephalopathy, unspecified: Secondary | ICD-10-CM | POA: Diagnosis not present

## 2017-05-02 DIAGNOSIS — G0430 Acute necrotizing hemorrhagic encephalopathy, unspecified: Secondary | ICD-10-CM | POA: Diagnosis not present

## 2017-05-03 DIAGNOSIS — G0430 Acute necrotizing hemorrhagic encephalopathy, unspecified: Secondary | ICD-10-CM | POA: Diagnosis not present

## 2017-05-04 DIAGNOSIS — G0430 Acute necrotizing hemorrhagic encephalopathy, unspecified: Secondary | ICD-10-CM | POA: Diagnosis not present

## 2017-05-05 DIAGNOSIS — G0430 Acute necrotizing hemorrhagic encephalopathy, unspecified: Secondary | ICD-10-CM | POA: Diagnosis not present

## 2017-05-06 DIAGNOSIS — G0430 Acute necrotizing hemorrhagic encephalopathy, unspecified: Secondary | ICD-10-CM | POA: Diagnosis not present

## 2017-05-07 DIAGNOSIS — G0430 Acute necrotizing hemorrhagic encephalopathy, unspecified: Secondary | ICD-10-CM | POA: Diagnosis not present

## 2017-05-08 DIAGNOSIS — G0430 Acute necrotizing hemorrhagic encephalopathy, unspecified: Secondary | ICD-10-CM | POA: Diagnosis not present

## 2017-05-09 DIAGNOSIS — G0430 Acute necrotizing hemorrhagic encephalopathy, unspecified: Secondary | ICD-10-CM | POA: Diagnosis not present

## 2017-05-10 DIAGNOSIS — G0430 Acute necrotizing hemorrhagic encephalopathy, unspecified: Secondary | ICD-10-CM | POA: Diagnosis not present

## 2017-05-11 DIAGNOSIS — G0430 Acute necrotizing hemorrhagic encephalopathy, unspecified: Secondary | ICD-10-CM | POA: Diagnosis not present

## 2017-05-12 DIAGNOSIS — G0430 Acute necrotizing hemorrhagic encephalopathy, unspecified: Secondary | ICD-10-CM | POA: Diagnosis not present

## 2017-05-13 DIAGNOSIS — G0430 Acute necrotizing hemorrhagic encephalopathy, unspecified: Secondary | ICD-10-CM | POA: Diagnosis not present

## 2017-05-14 DIAGNOSIS — G0430 Acute necrotizing hemorrhagic encephalopathy, unspecified: Secondary | ICD-10-CM | POA: Diagnosis not present

## 2017-05-15 DIAGNOSIS — G0430 Acute necrotizing hemorrhagic encephalopathy, unspecified: Secondary | ICD-10-CM | POA: Diagnosis not present

## 2017-05-16 DIAGNOSIS — G0430 Acute necrotizing hemorrhagic encephalopathy, unspecified: Secondary | ICD-10-CM | POA: Diagnosis not present

## 2017-05-17 DIAGNOSIS — G0430 Acute necrotizing hemorrhagic encephalopathy, unspecified: Secondary | ICD-10-CM | POA: Diagnosis not present

## 2017-05-18 DIAGNOSIS — G0430 Acute necrotizing hemorrhagic encephalopathy, unspecified: Secondary | ICD-10-CM | POA: Diagnosis not present

## 2017-05-19 DIAGNOSIS — G0430 Acute necrotizing hemorrhagic encephalopathy, unspecified: Secondary | ICD-10-CM | POA: Diagnosis not present

## 2017-05-20 DIAGNOSIS — G0430 Acute necrotizing hemorrhagic encephalopathy, unspecified: Secondary | ICD-10-CM | POA: Diagnosis not present

## 2017-05-21 DIAGNOSIS — G0430 Acute necrotizing hemorrhagic encephalopathy, unspecified: Secondary | ICD-10-CM | POA: Diagnosis not present

## 2017-05-22 DIAGNOSIS — G0430 Acute necrotizing hemorrhagic encephalopathy, unspecified: Secondary | ICD-10-CM | POA: Diagnosis not present

## 2017-05-23 DIAGNOSIS — G0430 Acute necrotizing hemorrhagic encephalopathy, unspecified: Secondary | ICD-10-CM | POA: Diagnosis not present

## 2017-05-24 DIAGNOSIS — G0430 Acute necrotizing hemorrhagic encephalopathy, unspecified: Secondary | ICD-10-CM | POA: Diagnosis not present

## 2017-05-25 DIAGNOSIS — G0430 Acute necrotizing hemorrhagic encephalopathy, unspecified: Secondary | ICD-10-CM | POA: Diagnosis not present

## 2017-05-26 DIAGNOSIS — G0430 Acute necrotizing hemorrhagic encephalopathy, unspecified: Secondary | ICD-10-CM | POA: Diagnosis not present

## 2017-05-27 DIAGNOSIS — G0430 Acute necrotizing hemorrhagic encephalopathy, unspecified: Secondary | ICD-10-CM | POA: Diagnosis not present

## 2017-05-28 DIAGNOSIS — G0430 Acute necrotizing hemorrhagic encephalopathy, unspecified: Secondary | ICD-10-CM | POA: Diagnosis not present

## 2017-05-29 DIAGNOSIS — G0430 Acute necrotizing hemorrhagic encephalopathy, unspecified: Secondary | ICD-10-CM | POA: Diagnosis not present

## 2017-05-30 DIAGNOSIS — G0430 Acute necrotizing hemorrhagic encephalopathy, unspecified: Secondary | ICD-10-CM | POA: Diagnosis not present

## 2017-05-31 DIAGNOSIS — G0430 Acute necrotizing hemorrhagic encephalopathy, unspecified: Secondary | ICD-10-CM | POA: Diagnosis not present

## 2017-06-01 DIAGNOSIS — F330 Major depressive disorder, recurrent, mild: Secondary | ICD-10-CM | POA: Diagnosis not present

## 2017-06-01 DIAGNOSIS — G0430 Acute necrotizing hemorrhagic encephalopathy, unspecified: Secondary | ICD-10-CM | POA: Diagnosis not present

## 2017-06-01 DIAGNOSIS — F0281 Dementia in other diseases classified elsewhere with behavioral disturbance: Secondary | ICD-10-CM | POA: Diagnosis not present

## 2017-06-01 DIAGNOSIS — F411 Generalized anxiety disorder: Secondary | ICD-10-CM | POA: Diagnosis not present

## 2017-06-02 DIAGNOSIS — G0430 Acute necrotizing hemorrhagic encephalopathy, unspecified: Secondary | ICD-10-CM | POA: Diagnosis not present

## 2017-06-03 DIAGNOSIS — G0430 Acute necrotizing hemorrhagic encephalopathy, unspecified: Secondary | ICD-10-CM | POA: Diagnosis not present

## 2017-06-04 DIAGNOSIS — G0430 Acute necrotizing hemorrhagic encephalopathy, unspecified: Secondary | ICD-10-CM | POA: Diagnosis not present

## 2017-06-05 DIAGNOSIS — G0430 Acute necrotizing hemorrhagic encephalopathy, unspecified: Secondary | ICD-10-CM | POA: Diagnosis not present

## 2017-06-06 DIAGNOSIS — G0430 Acute necrotizing hemorrhagic encephalopathy, unspecified: Secondary | ICD-10-CM | POA: Diagnosis not present

## 2017-06-07 DIAGNOSIS — G0430 Acute necrotizing hemorrhagic encephalopathy, unspecified: Secondary | ICD-10-CM | POA: Diagnosis not present

## 2017-06-08 DIAGNOSIS — G0430 Acute necrotizing hemorrhagic encephalopathy, unspecified: Secondary | ICD-10-CM | POA: Diagnosis not present

## 2017-06-09 DIAGNOSIS — G0430 Acute necrotizing hemorrhagic encephalopathy, unspecified: Secondary | ICD-10-CM | POA: Diagnosis not present

## 2017-06-10 DIAGNOSIS — G0430 Acute necrotizing hemorrhagic encephalopathy, unspecified: Secondary | ICD-10-CM | POA: Diagnosis not present

## 2017-06-11 DIAGNOSIS — G0430 Acute necrotizing hemorrhagic encephalopathy, unspecified: Secondary | ICD-10-CM | POA: Diagnosis not present

## 2017-06-12 DIAGNOSIS — G0430 Acute necrotizing hemorrhagic encephalopathy, unspecified: Secondary | ICD-10-CM | POA: Diagnosis not present

## 2017-06-13 DIAGNOSIS — G0430 Acute necrotizing hemorrhagic encephalopathy, unspecified: Secondary | ICD-10-CM | POA: Diagnosis not present

## 2017-06-14 DIAGNOSIS — G0430 Acute necrotizing hemorrhagic encephalopathy, unspecified: Secondary | ICD-10-CM | POA: Diagnosis not present

## 2017-06-14 IMAGING — DX DG LUMBAR SPINE COMPLETE 4+V
5 series · 5 of 5 positions shown · non-contrast
Comparison: January 07, 2015

CLINICAL DATA: Status post fall today with back pain.

EXAM:
LUMBAR SPINE - COMPLETE 4+ VIEW

[l-spine ap]
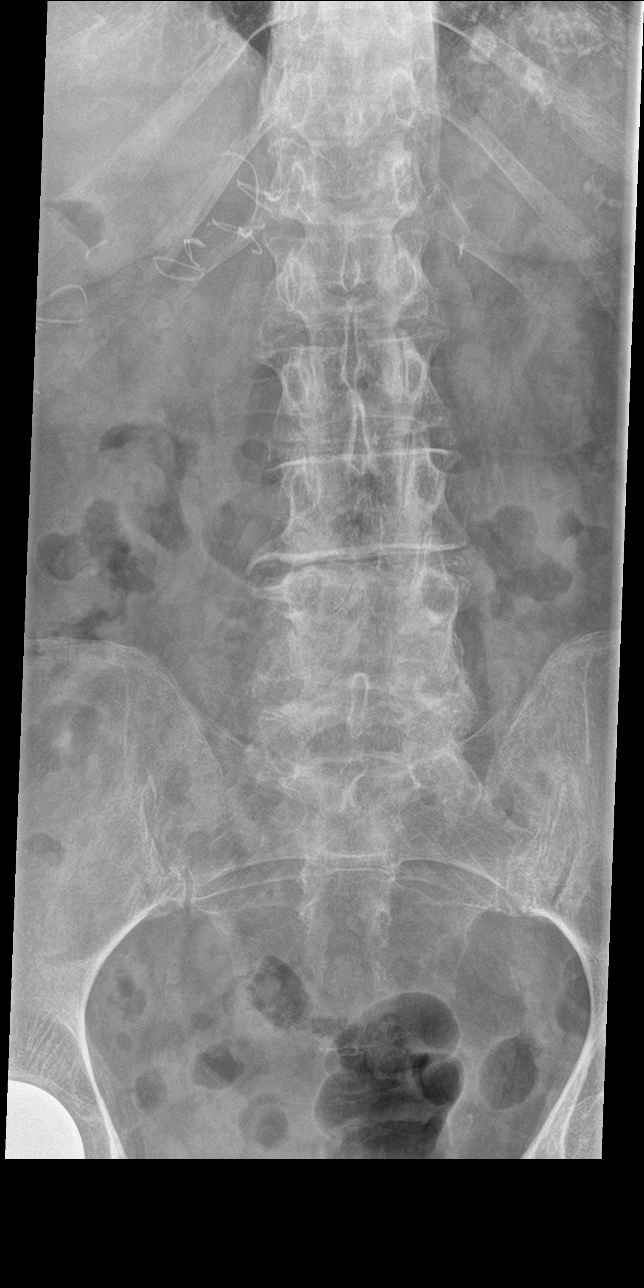

[l-spine obl (1 of 2)]
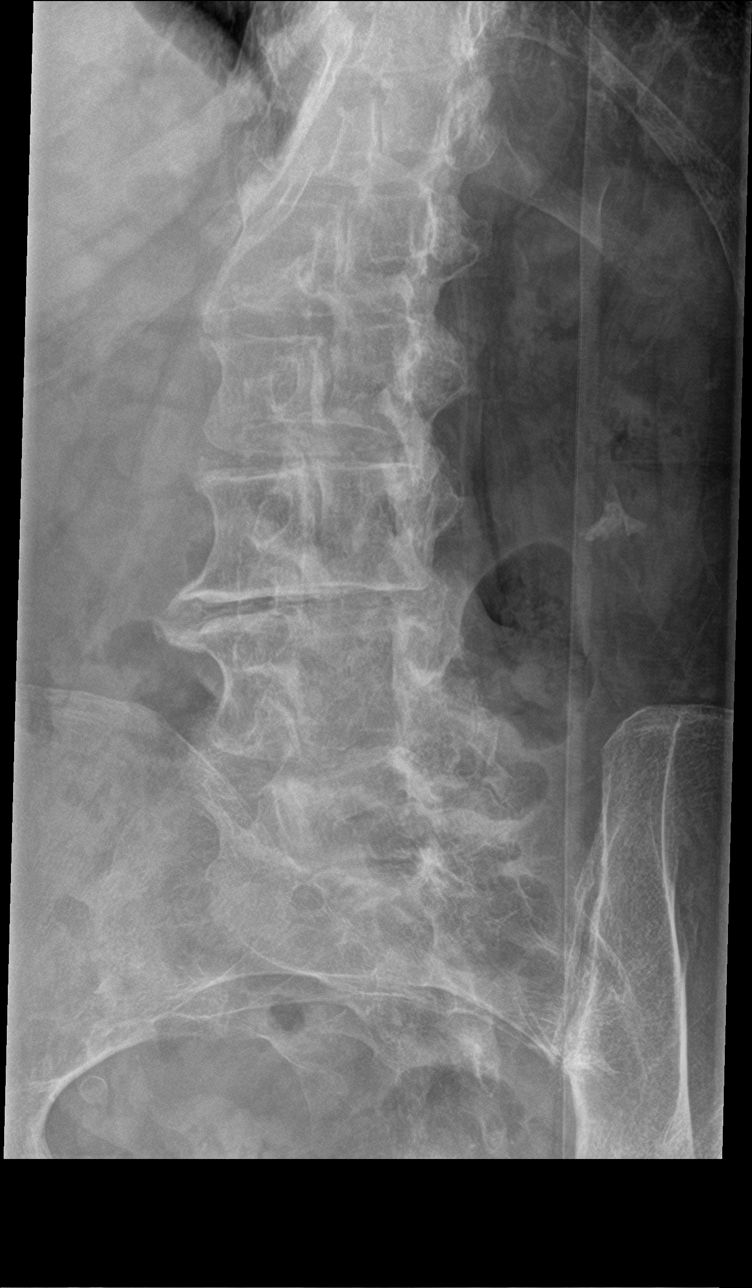

[l-spine obl (2 of 2)]
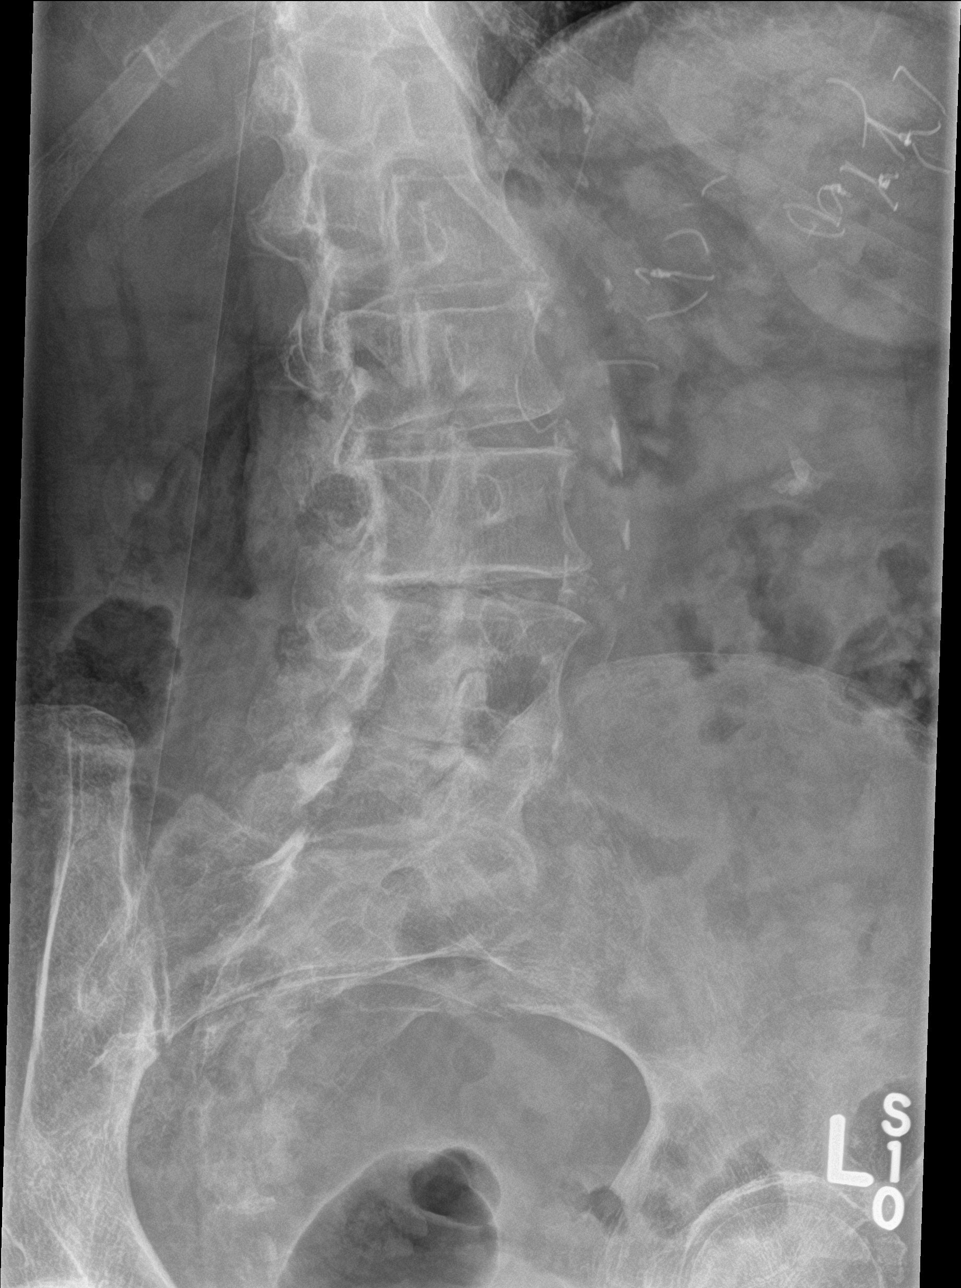

[l-spine lat]
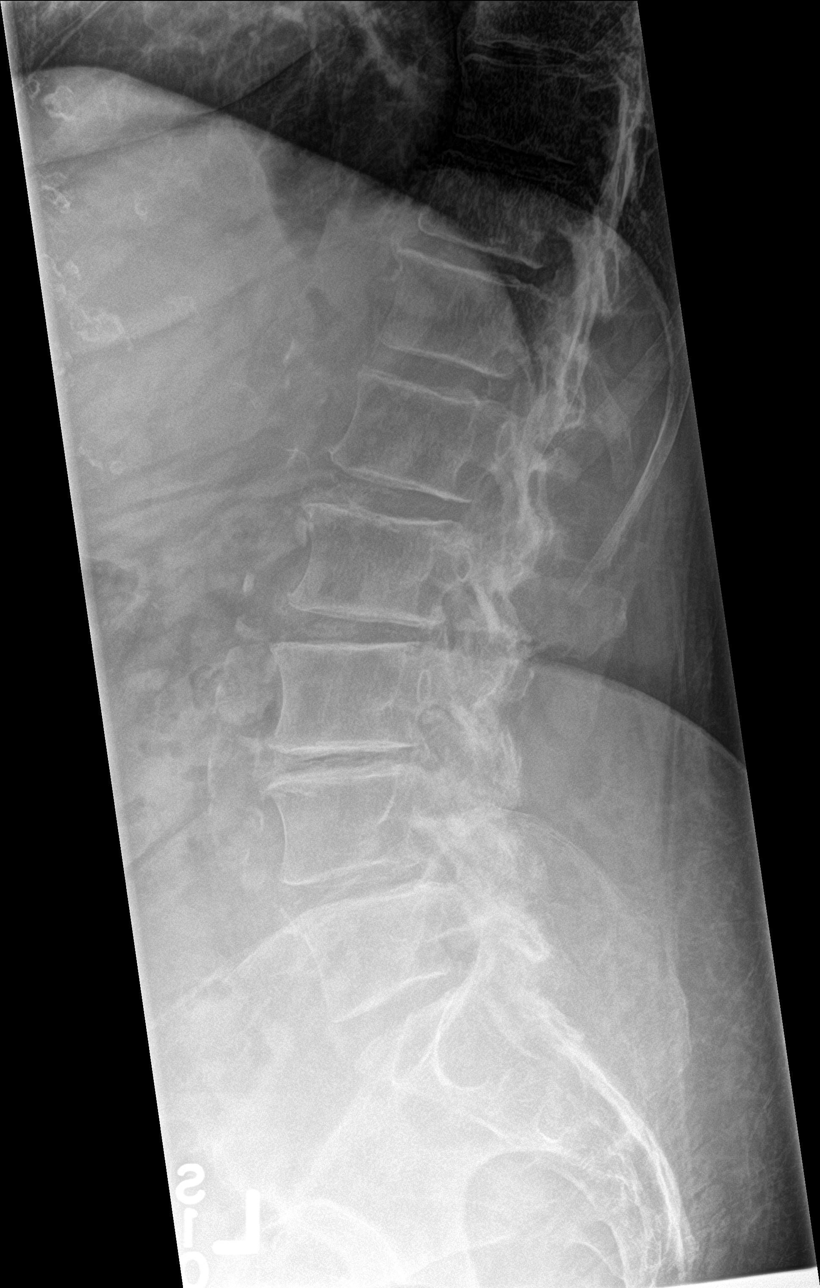

[l-spine spot]
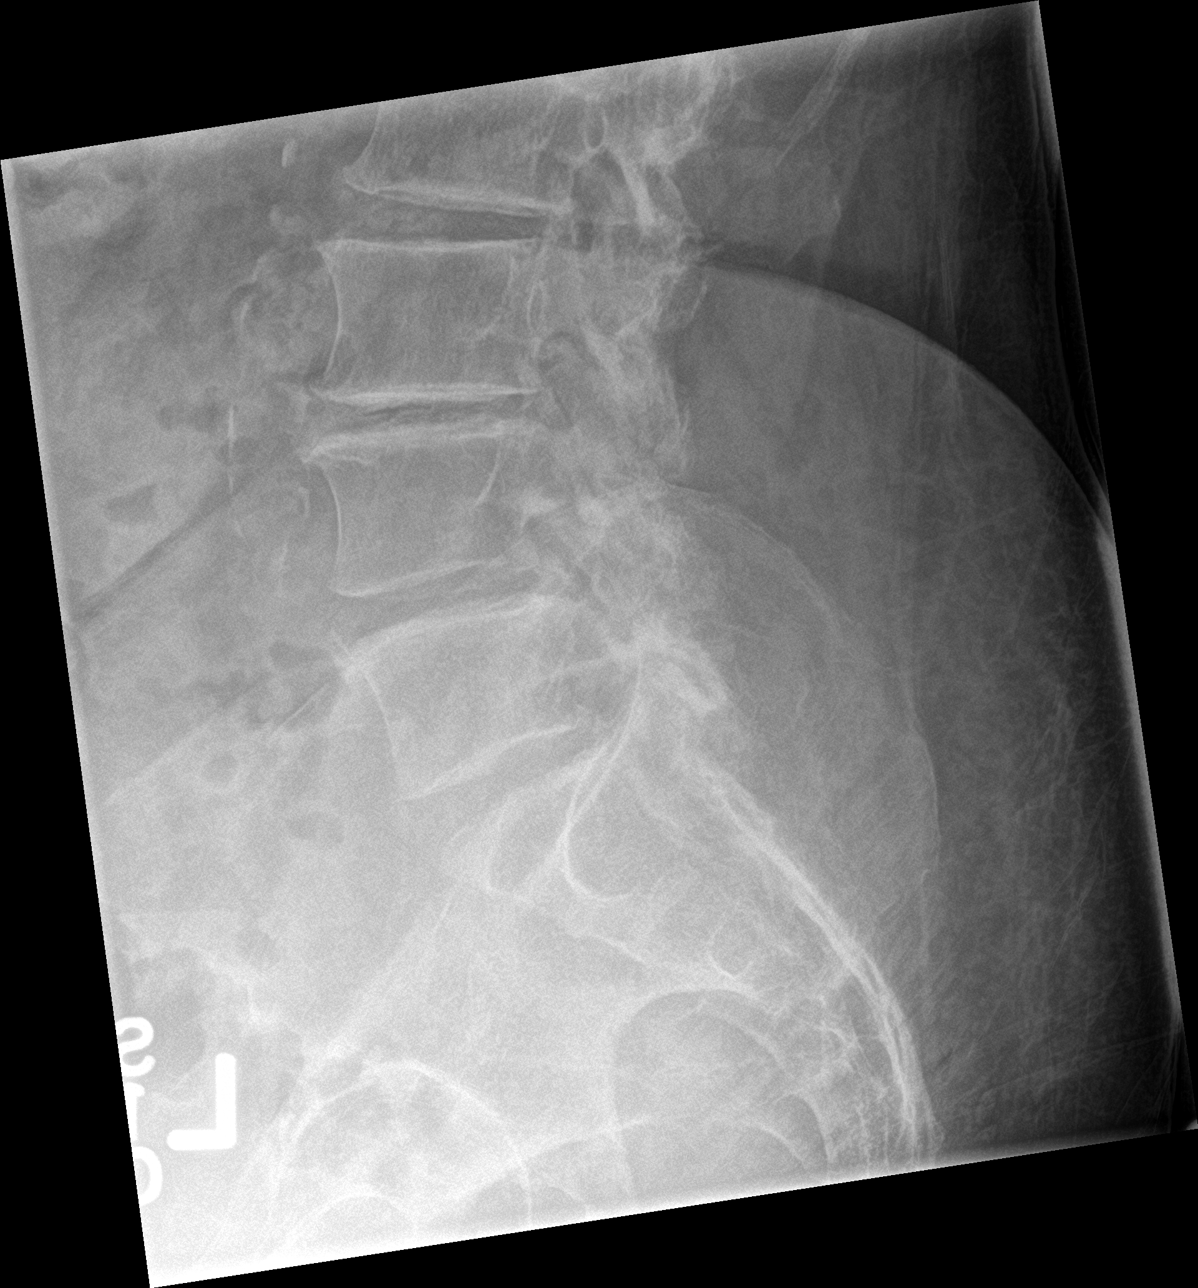

[5 of 5 positions shown; findings below may reference images not displayed]

FINDINGS: There is no evidence of lumbar spine fracture. Alignment is normal.
There are degenerative joint changes with narrowed joint space and
osteophyte formation.
IMPRESSION: No acute fracture or dislocation. Degenerative joint changes of
lumbar spine.

## 2017-06-14 IMAGING — DX DG PELVIS 1-2V
1 series · 1 of 1 positions shown · non-contrast
Comparison: September 20, 2015

CLINICAL DATA: Status post fall today.

EXAM:
PELVIS - 1-2 VIEW

[pelvis ap]
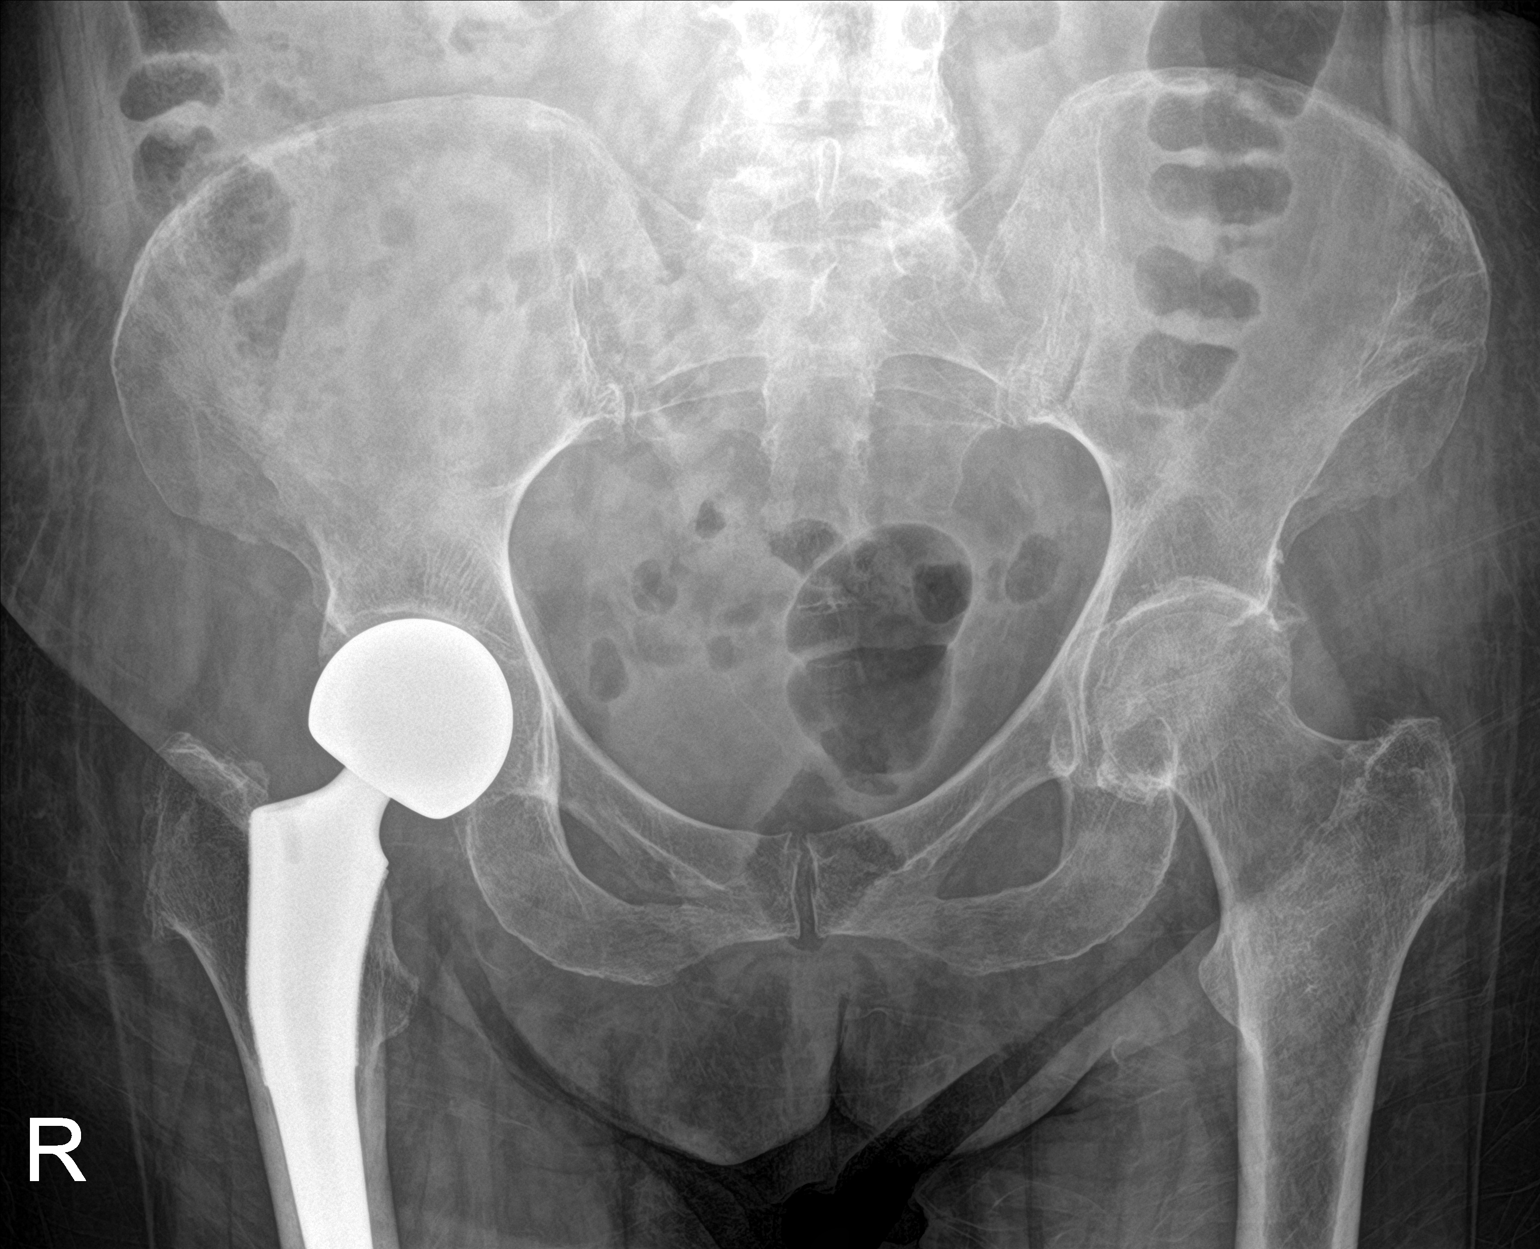

[1 of 1 positions shown; findings below may reference images not displayed]

FINDINGS: There is no evidence of pelvic fracture or diastasis. Degenerative
joint changes of the left hip is identified. Right hip hemi
arthropod plasty is noted without dislocation.
IMPRESSION: No acute abnormality.

## 2017-06-14 IMAGING — CT CT CERVICAL SPINE W/O CM
4 of 9 series · 12 of 33 positions shown, 13 images · non-contrast
Comparison: 01/09/2016 head CT. 06/08/2015 head CT and cervical
spine CT.

CLINICAL DATA: [AGE] female post fall. Frontal headache and
minor neck pain. Initial encounter.

EXAM:
CT HEAD WITHOUT CONTRAST
CT CERVICAL SPINE WITHOUT CONTRAST
TECHNIQUE: Multidetector CT imaging of the head and cervical spine was
performed following the standard protocol without intravenous
contrast. Multiplanar CT image reconstructions of the cervical spine
were also generated.

[Series 3: head bone · axial · 0.40mm/px · z∈[-86,-34]mm · 2 of 78 slices shown]
[im 26/78  bone]
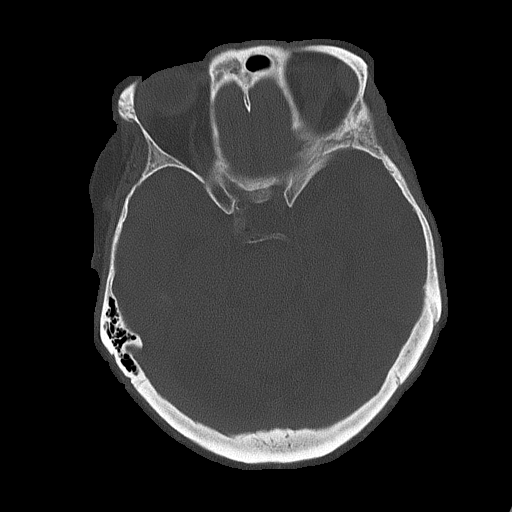
[im 52/78  bone]
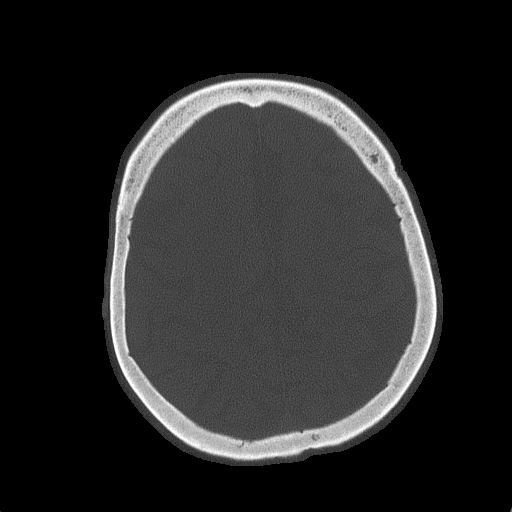

[Series 7: c_spine 2.0 st · axial · 0.22mm/px · z∈[-256,-162]mm · 3 of 95 slices shown, 4 images]
[im 24/95  soft-tissue]
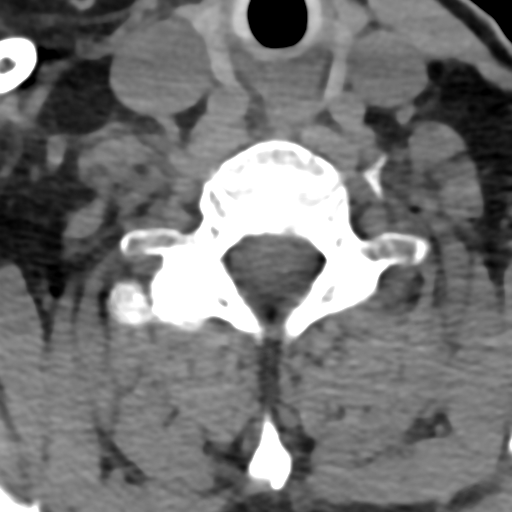
[im 24/95  bone]
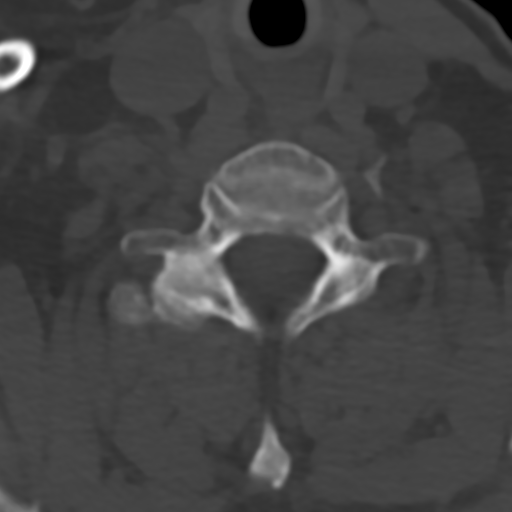
[im 48/95  bone]
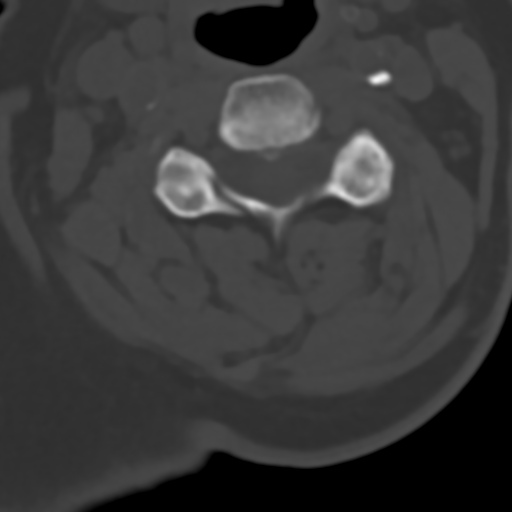
[im 71/95  bone]
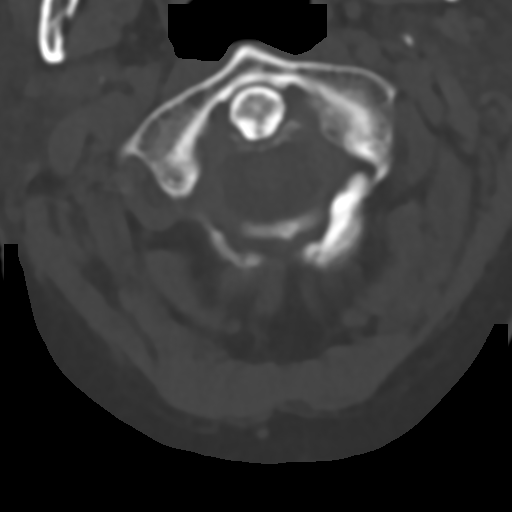

[Series 9: c_spine 2.0 sag bone · sagittal · 0.28mm/px · 5 of 61 slices shown]
[im 11/61  bone]
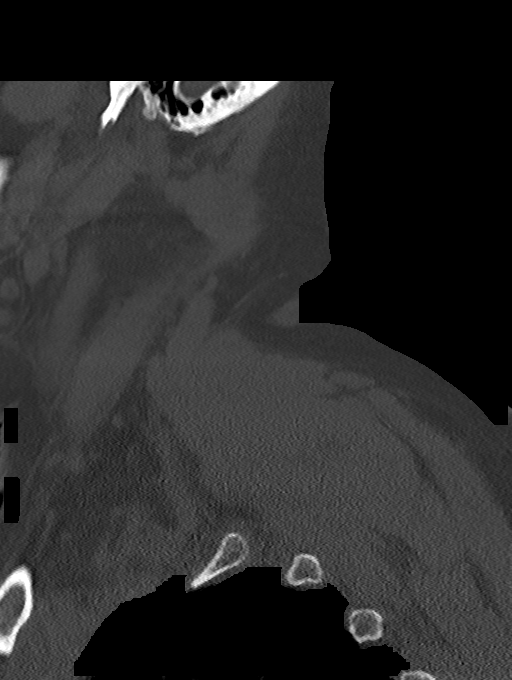
[im 21/61  bone]
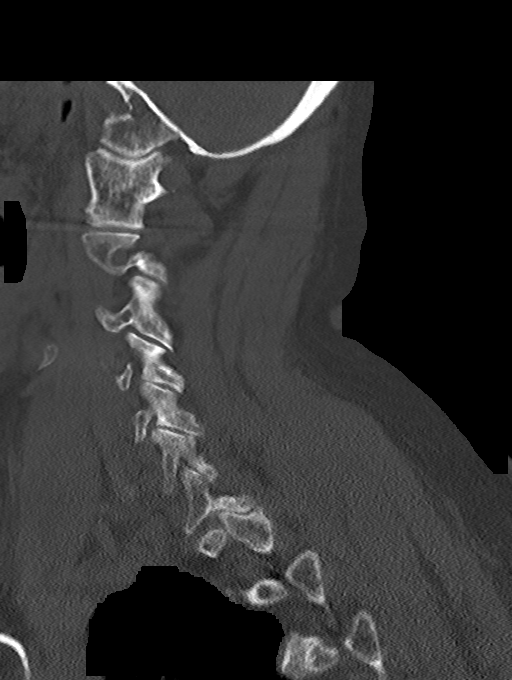
[im 31/61  bone]
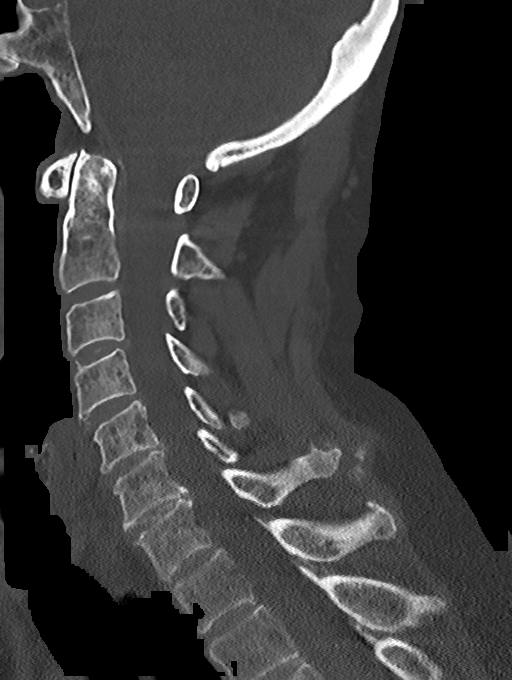
[im 41/61  bone]
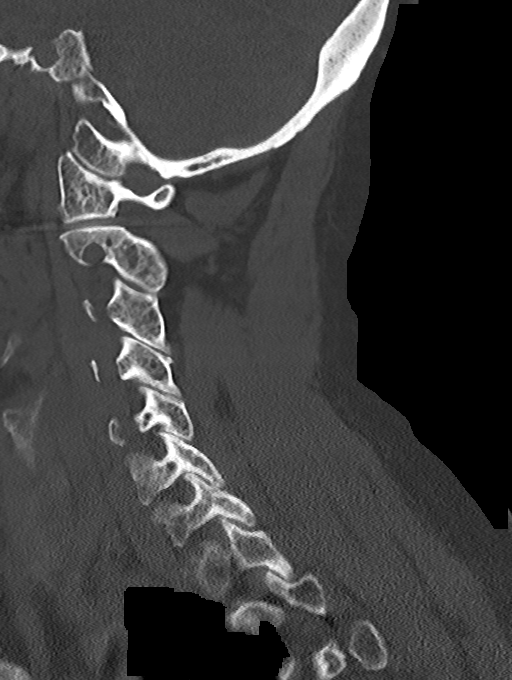
[im 51/61  bone]
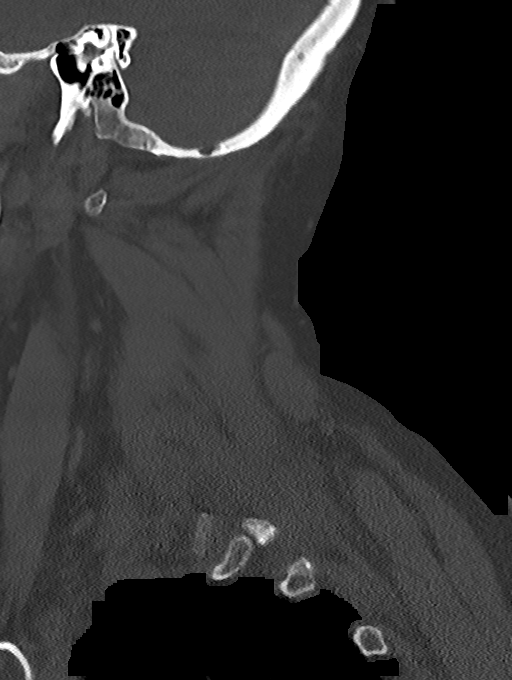

[Series 12: c_spine 2.0 orthogonals · axial · 0.21mm/px · z∈[-271,-214]mm · 2 of 91 slices shown]
[im 31/91  bone]
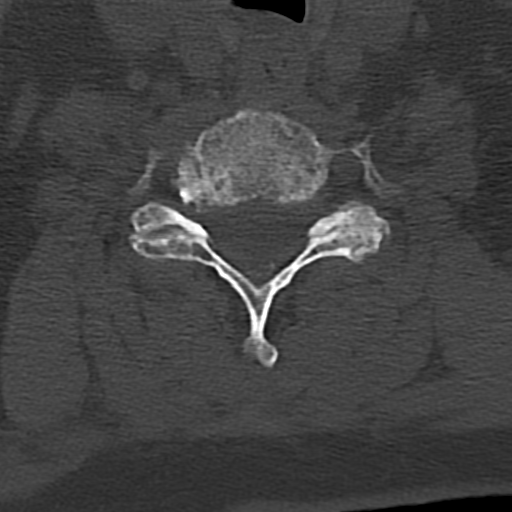
[im 61/91  bone]
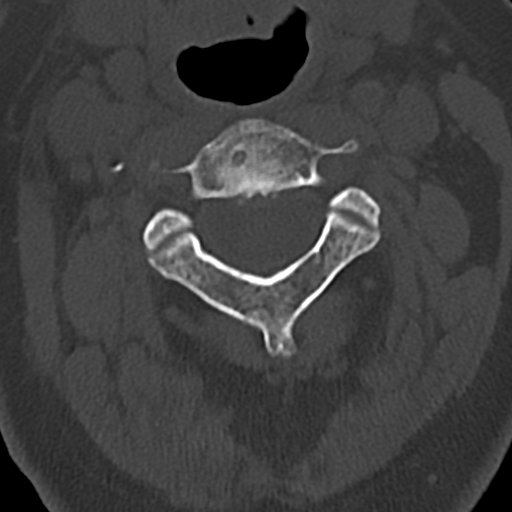

[12 of 33 positions shown; findings below may reference images not displayed]

FINDINGS: CT HEAD FINDINGS

Brain: No intracranial hemorrhage or CT evidence of large acute
infarct.

Chronic microvascular changes.

Moderate global atrophy without hydrocephalus.

No intracranial mass lesion noted on this unenhanced exam.

Vascular: Vascular calcifications.

Skull: No skull fracture.

Sinuses/Orbits: No acute orbital injury. Mucosal thickening left
sphenoid sinus.

Other: Right parietal scalp hematoma may be present.

CT CERVICAL SPINE FINDINGS

Alignment: Relatively normal.

Skull base and vertebrae: No cervical spine fracture.

Soft tissues and spinal canal: No abnormal prevertebral soft tissue
swelling. No neck mass identified.

Cervical spondylotic changes with various degrees of spinal stenosis
and foraminal narrowing without significant or perforation.

Upper chest: Scarring lung apices.

Other: Negative.
IMPRESSION: CT HEAD

No skull fracture or intracranial hemorrhage.

Chronic microvascular changes.

Moderate global atrophy.

Mucosal thickening left sphenoid sinus.

Right parietal scalp hematoma may be present.

CT CERVICAL SPINE

No cervical spine fracture or abnormal alignment.

No abnormal prevertebral soft tissue swelling.

## 2017-06-14 IMAGING — DX DG THORACIC SPINE 2V
3 series · 3 of 3 positions shown · non-contrast
Comparison: Chest x-ray February 06, 2015

CLINICAL DATA: Status post fall today.

EXAM:
THORACIC SPINE 2 VIEWS

[t-spine ap]
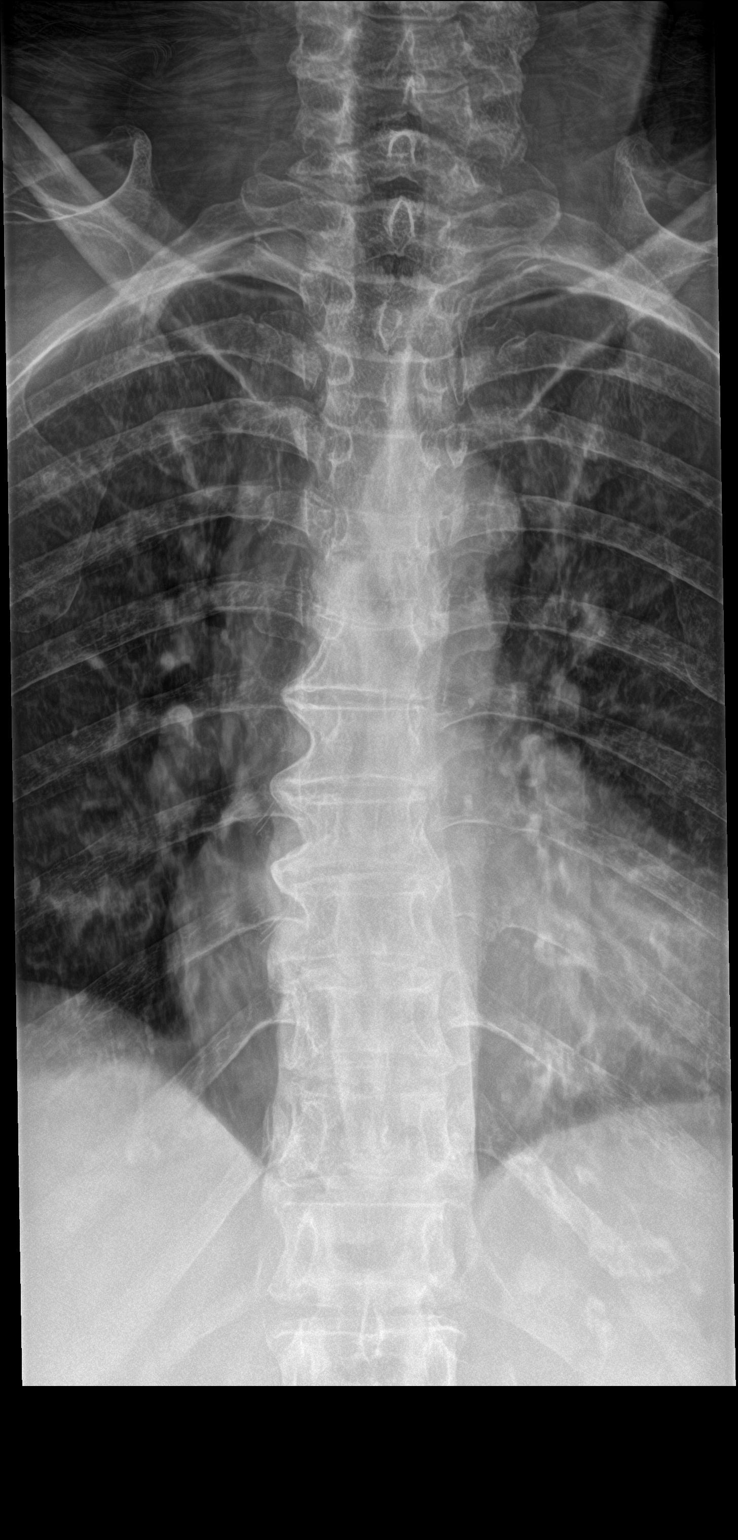

[t-spine lat]
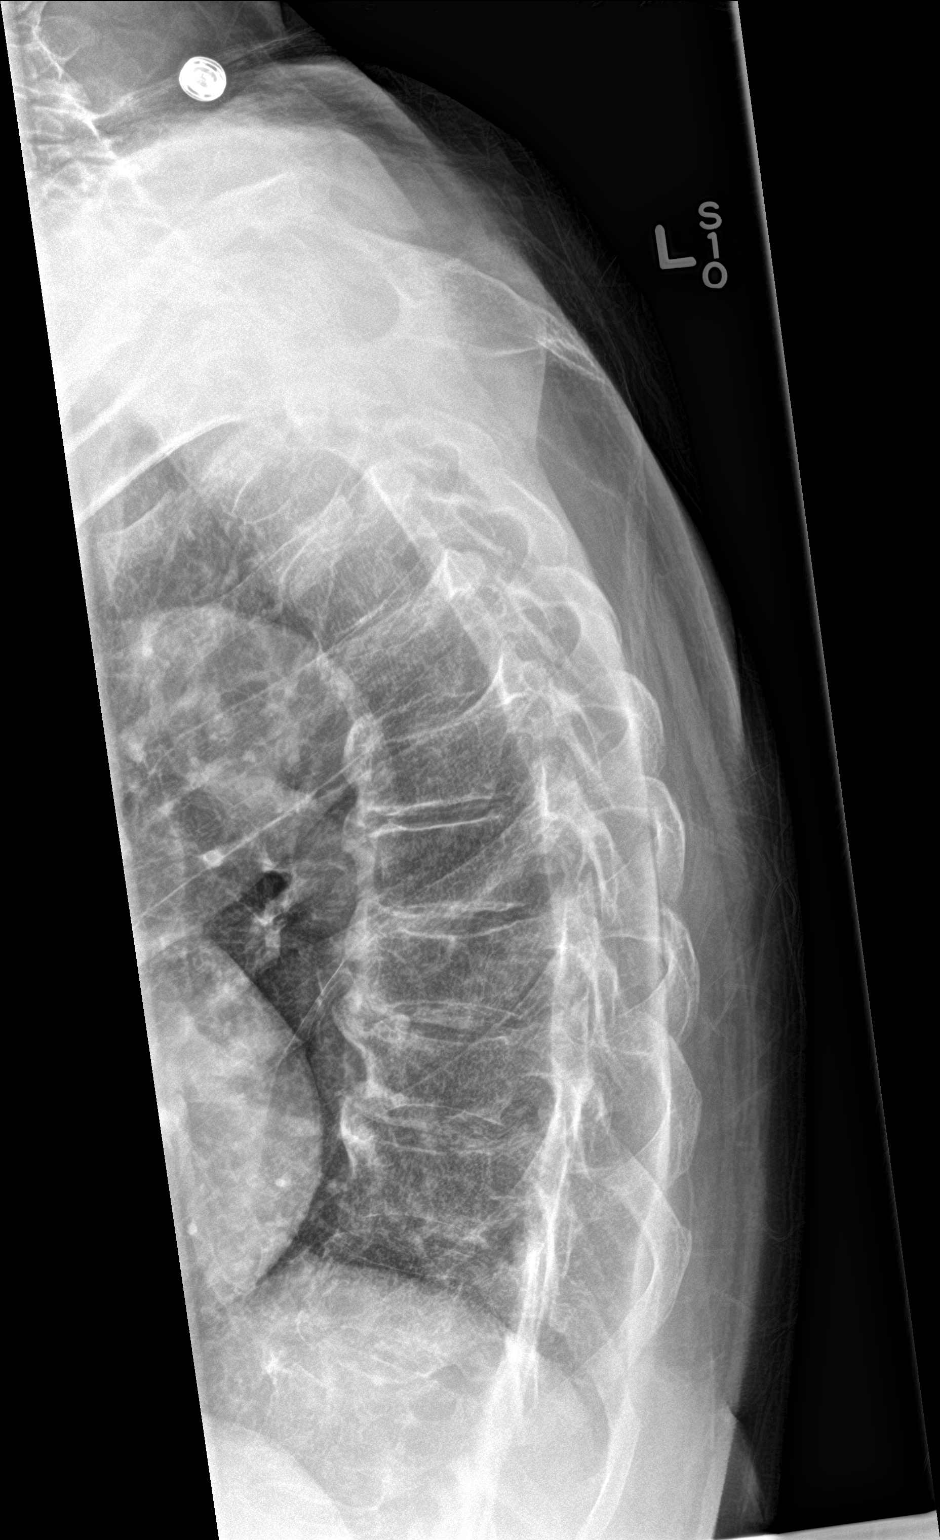

[t-spine swimmers]
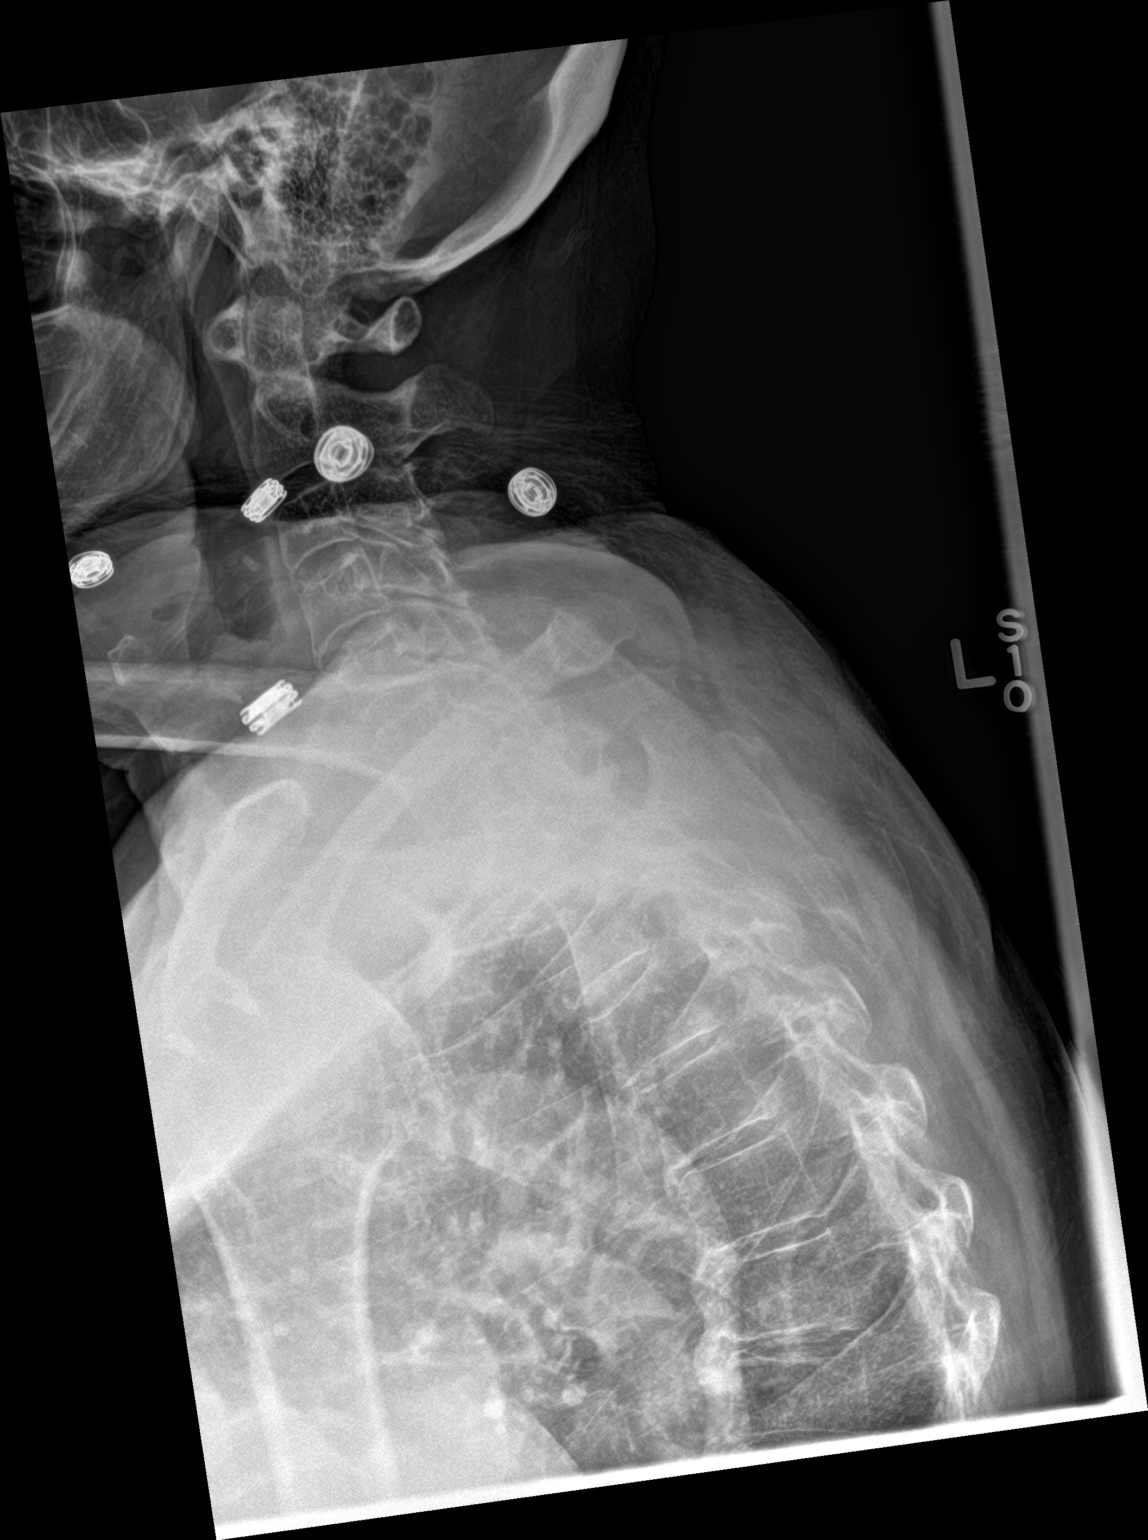

[3 of 3 positions shown; findings below may reference images not displayed]

FINDINGS: There is no evidence of thoracic spine fracture. Alignment is
normal. Degenerative joint changes of the spine are identified.
IMPRESSION: Negative.

## 2017-06-15 DIAGNOSIS — G0430 Acute necrotizing hemorrhagic encephalopathy, unspecified: Secondary | ICD-10-CM | POA: Diagnosis not present

## 2017-06-16 DIAGNOSIS — G0430 Acute necrotizing hemorrhagic encephalopathy, unspecified: Secondary | ICD-10-CM | POA: Diagnosis not present

## 2017-06-17 DIAGNOSIS — G0430 Acute necrotizing hemorrhagic encephalopathy, unspecified: Secondary | ICD-10-CM | POA: Diagnosis not present

## 2017-06-18 DIAGNOSIS — G0430 Acute necrotizing hemorrhagic encephalopathy, unspecified: Secondary | ICD-10-CM | POA: Diagnosis not present

## 2017-06-19 DIAGNOSIS — G0430 Acute necrotizing hemorrhagic encephalopathy, unspecified: Secondary | ICD-10-CM | POA: Diagnosis not present

## 2017-06-20 DIAGNOSIS — G0430 Acute necrotizing hemorrhagic encephalopathy, unspecified: Secondary | ICD-10-CM | POA: Diagnosis not present

## 2017-06-21 DIAGNOSIS — G0430 Acute necrotizing hemorrhagic encephalopathy, unspecified: Secondary | ICD-10-CM | POA: Diagnosis not present

## 2017-06-22 DIAGNOSIS — F411 Generalized anxiety disorder: Secondary | ICD-10-CM | POA: Diagnosis not present

## 2017-06-22 DIAGNOSIS — F330 Major depressive disorder, recurrent, mild: Secondary | ICD-10-CM | POA: Diagnosis not present

## 2017-06-22 DIAGNOSIS — F0281 Dementia in other diseases classified elsewhere with behavioral disturbance: Secondary | ICD-10-CM | POA: Diagnosis not present

## 2017-06-22 DIAGNOSIS — G0430 Acute necrotizing hemorrhagic encephalopathy, unspecified: Secondary | ICD-10-CM | POA: Diagnosis not present

## 2017-06-23 DIAGNOSIS — G0430 Acute necrotizing hemorrhagic encephalopathy, unspecified: Secondary | ICD-10-CM | POA: Diagnosis not present

## 2017-06-24 DIAGNOSIS — G0430 Acute necrotizing hemorrhagic encephalopathy, unspecified: Secondary | ICD-10-CM | POA: Diagnosis not present

## 2017-06-24 DIAGNOSIS — Z79899 Other long term (current) drug therapy: Secondary | ICD-10-CM | POA: Diagnosis not present

## 2017-06-24 DIAGNOSIS — E785 Hyperlipidemia, unspecified: Secondary | ICD-10-CM | POA: Diagnosis not present

## 2017-06-24 DIAGNOSIS — Z5181 Encounter for therapeutic drug level monitoring: Secondary | ICD-10-CM | POA: Diagnosis not present

## 2017-06-24 DIAGNOSIS — E119 Type 2 diabetes mellitus without complications: Secondary | ICD-10-CM | POA: Diagnosis not present

## 2017-06-24 DIAGNOSIS — D649 Anemia, unspecified: Secondary | ICD-10-CM | POA: Diagnosis not present

## 2017-06-25 DIAGNOSIS — G0430 Acute necrotizing hemorrhagic encephalopathy, unspecified: Secondary | ICD-10-CM | POA: Diagnosis not present

## 2017-06-26 DIAGNOSIS — G0430 Acute necrotizing hemorrhagic encephalopathy, unspecified: Secondary | ICD-10-CM | POA: Diagnosis not present

## 2017-06-27 DIAGNOSIS — G0430 Acute necrotizing hemorrhagic encephalopathy, unspecified: Secondary | ICD-10-CM | POA: Diagnosis not present

## 2017-06-28 DIAGNOSIS — G0430 Acute necrotizing hemorrhagic encephalopathy, unspecified: Secondary | ICD-10-CM | POA: Diagnosis not present

## 2017-06-29 DIAGNOSIS — G0430 Acute necrotizing hemorrhagic encephalopathy, unspecified: Secondary | ICD-10-CM | POA: Diagnosis not present

## 2017-06-30 DIAGNOSIS — G0430 Acute necrotizing hemorrhagic encephalopathy, unspecified: Secondary | ICD-10-CM | POA: Diagnosis not present

## 2017-07-01 DIAGNOSIS — G0430 Acute necrotizing hemorrhagic encephalopathy, unspecified: Secondary | ICD-10-CM | POA: Diagnosis not present

## 2017-07-02 DIAGNOSIS — G0430 Acute necrotizing hemorrhagic encephalopathy, unspecified: Secondary | ICD-10-CM | POA: Diagnosis not present

## 2017-07-03 DIAGNOSIS — G0430 Acute necrotizing hemorrhagic encephalopathy, unspecified: Secondary | ICD-10-CM | POA: Diagnosis not present

## 2017-07-04 DIAGNOSIS — G0430 Acute necrotizing hemorrhagic encephalopathy, unspecified: Secondary | ICD-10-CM | POA: Diagnosis not present

## 2017-07-05 DIAGNOSIS — G0430 Acute necrotizing hemorrhagic encephalopathy, unspecified: Secondary | ICD-10-CM | POA: Diagnosis not present

## 2017-07-05 IMAGING — CT CT ABD-PELV W/ CM
2 of 4 series · 16 of 46 positions shown, 18 images · IV contrast (ISOVUE)
Comparison: 03/29/2013 CT of abdomen and pelvis

CLINICAL DATA: [AGE]/o F; unwitnessed fall complaining of left hip
and leg pain. Increasing confusion.

EXAM:
CT ABDOMEN AND PELVIS WITH CONTRAST
TECHNIQUE: Multidetector CT imaging of the abdomen and pelvis was performed
using the standard protocol following bolus administration of
intravenous contrast.
CONTRAST:  100mL [id number] IOPAMIDOL ([id number]) INJECTION 61%

[Series 2: abd/pel with · axial · 0.98mm/px · z∈[-884,-464]mm · 13 of 94 slices shown, 15 images]
[im 5/94  soft-tissue]
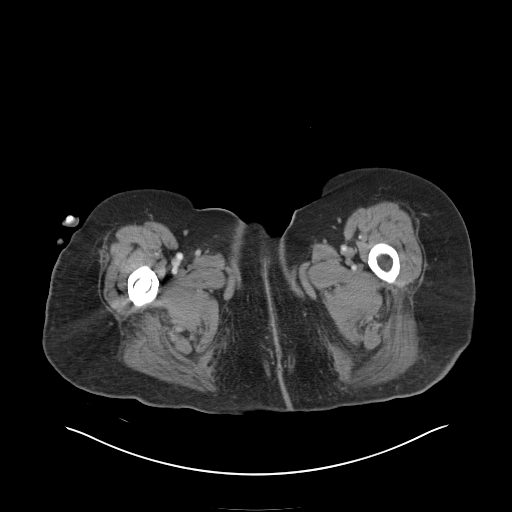
[im 5/94  bone]
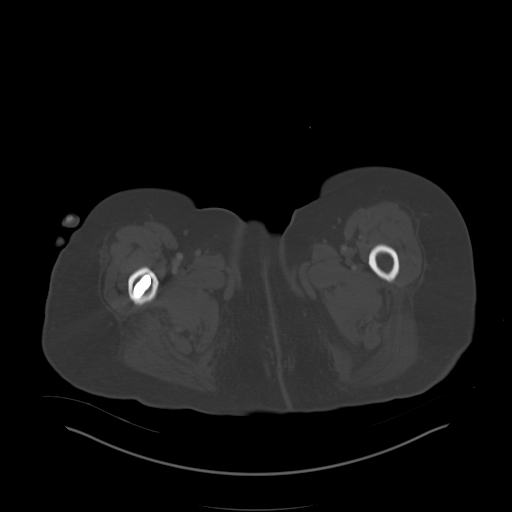
[im 13/94  soft-tissue]
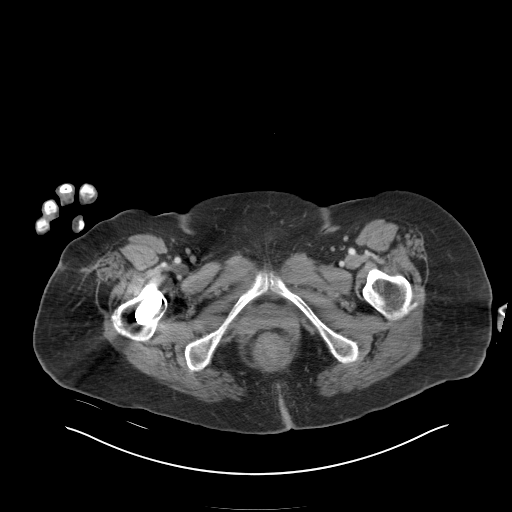
[im 21/94  soft-tissue]
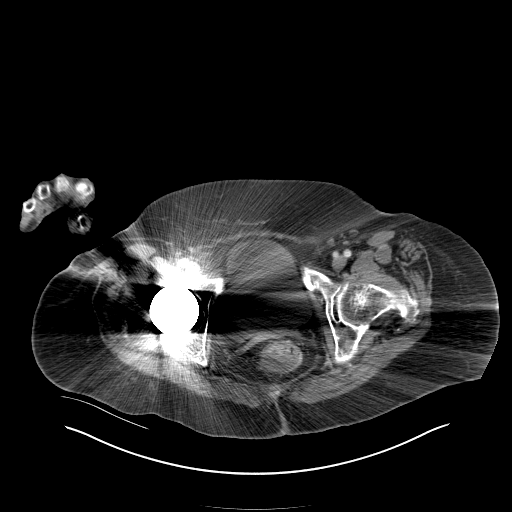
[im 25/94  soft-tissue]
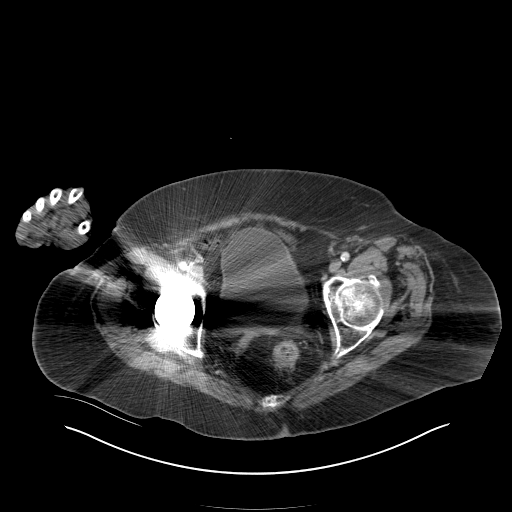
[im 33/94  soft-tissue]
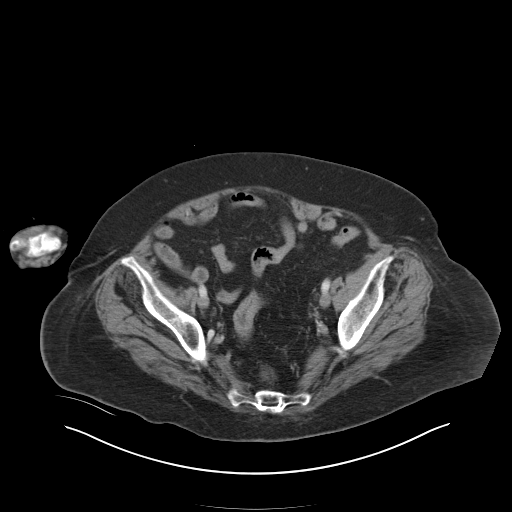
[im 41/94  soft-tissue]
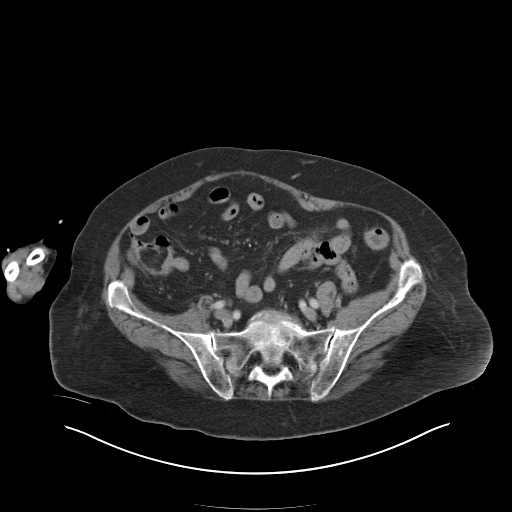
[im 49/94  soft-tissue]
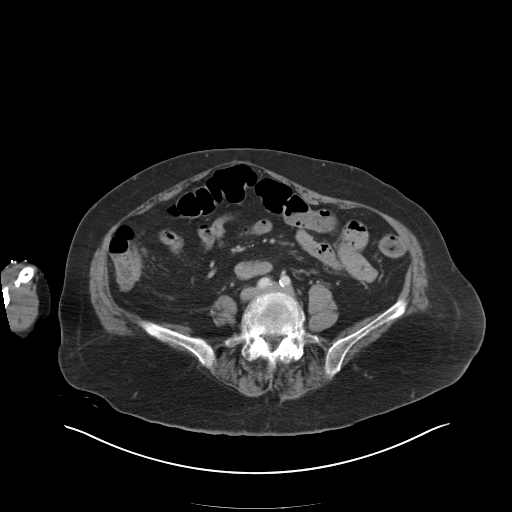
[im 53/94  soft-tissue]
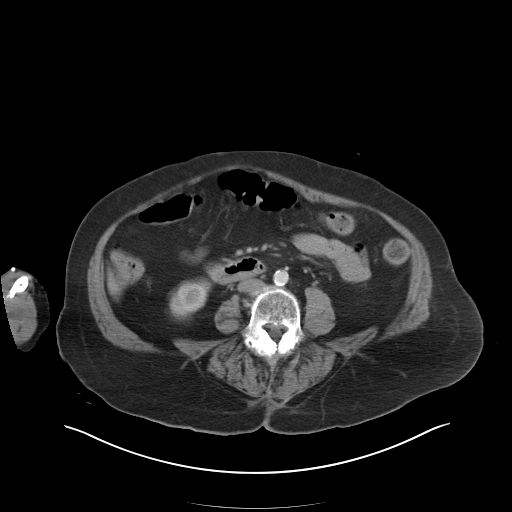
[im 61/94  soft-tissue]
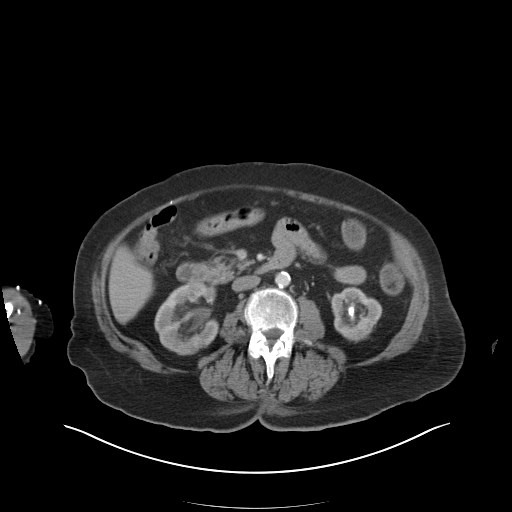
[im 61/94  bone]
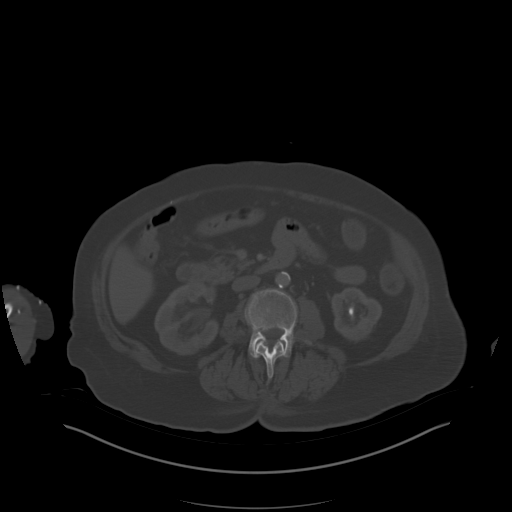
[im 69/94  soft-tissue]
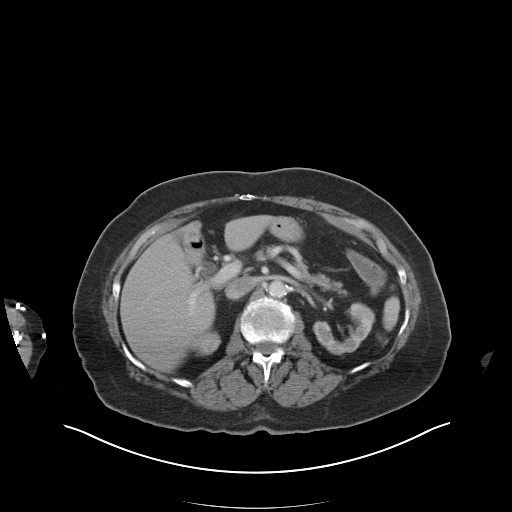
[im 73/94  soft-tissue]
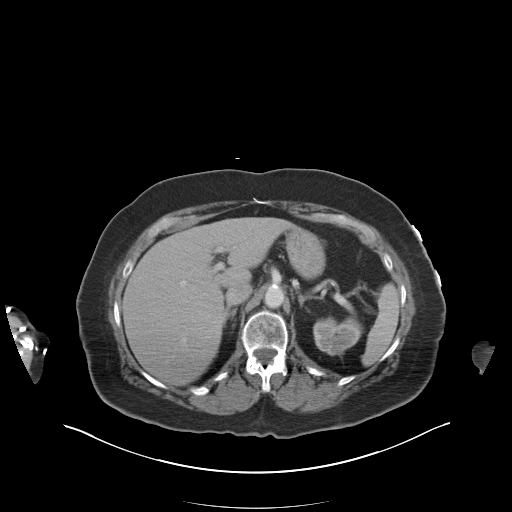
[im 81/94  soft-tissue]
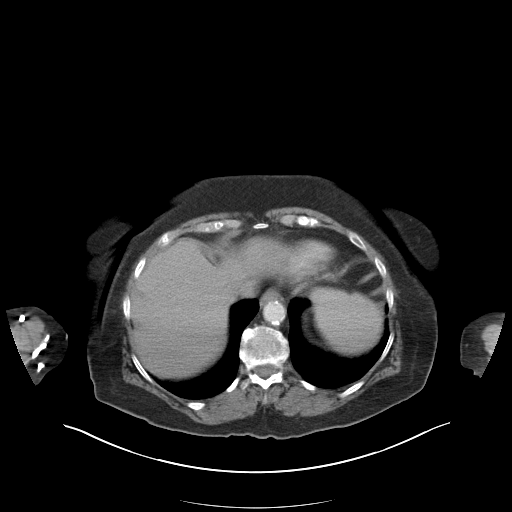
[im 89/94  soft-tissue]
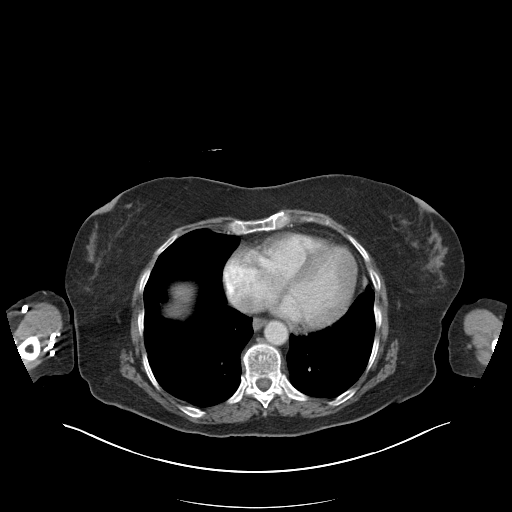

[Series 3: coronal a/|p · coronal · 0.91mm/px · 3 of 171 slices shown]
[im 57/171  soft-tissue]
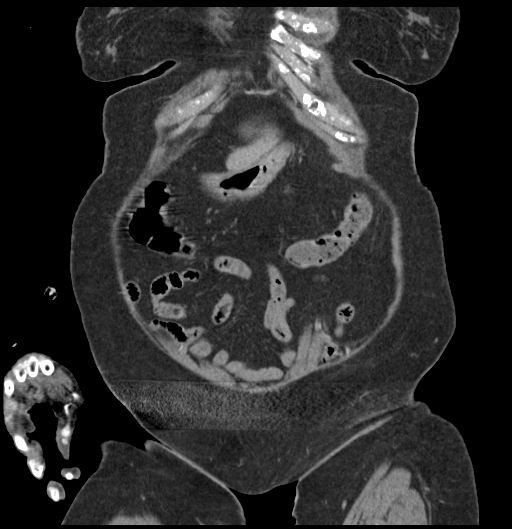
[im 76/171  soft-tissue]
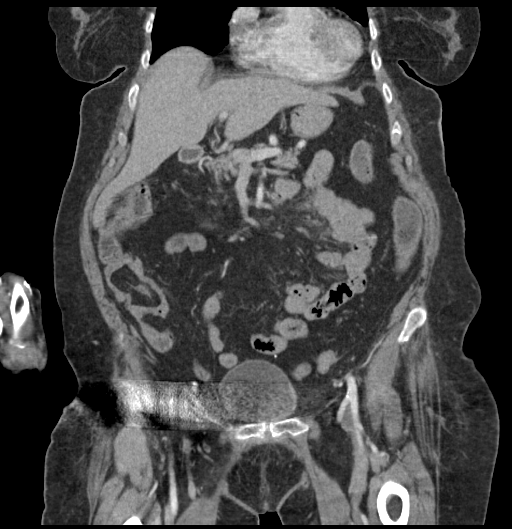
[im 95/171  soft-tissue]
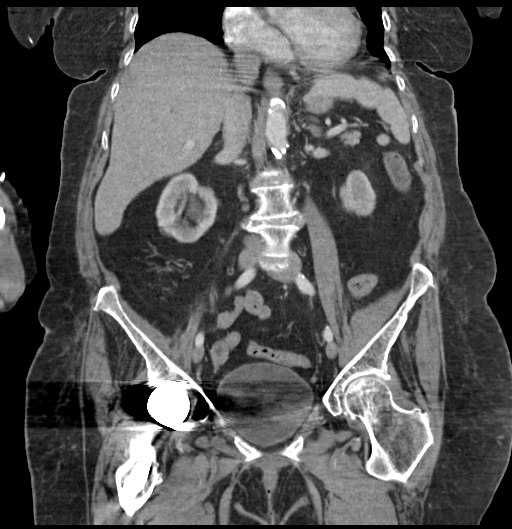

[16 of 46 positions shown; findings below may reference images not displayed]

FINDINGS: Lower chest: No acute abnormality.

Hepatobiliary: No focal liver abnormality is seen. Status post
cholecystectomy. No biliary dilatation.

Pancreas: Unremarkable. No pancreatic ductal dilatation or
surrounding inflammatory changes.

Spleen: Normal in size without focal abnormality. 11 mm splenule
inferior to lower pole of spleen.

Adrenals/Urinary Tract: Left kidney lower pole caliceal stone
measuring up to 20 mm. No focal kidney lesion. Stable subcentimeter
renal cysts bilaterally. Mild right-sided pelviectasis is unchanged.
No hydronephrosis. Normal adrenal glands. Normal bladder.

Stomach/Bowel: Stomach is within normal limits. Appendix not
identified. No evidence of bowel wall thickening, distention, or
inflammatory changes. Scattered diverticulosis without evidence of
diverticulitis.

Vascular/Lymphatic: Aortic atherosclerosis. No enlarged abdominal or
pelvic lymph nodes.

Reproductive: Status post hysterectomy. No adnexal masses.

Other: Surgical staples within the upper anterior abdominal wall,
moderate right paramedian diastases rectus with broad base
eventration mildly increased in size from prior CT.

Musculoskeletal: Right hip hemi arthroplasty appears well seated
without apparent hardware related complication. Mild osteoarthrosis
of the left hip joint with joint space narrowing and periarticular
osteophytes. No acute fracture is identified. Multilevel lumbar
spondylosis with discogenic changes, grade 1 L4-5 anterolisthesis,
and prominent facet arthropathy.
IMPRESSION: 1. No acute process identified.
2. Left kidney lower pole stable nonobstructing stone. No focal
kidney lesion or obstructive uropathy. Normal bladder.
3. Aortic atherosclerosis.

By: Anita Z Zuarez M.D.

## 2017-07-05 IMAGING — CR DG FEMUR 2+V PORT*L*
2 series · 2 of 2 positions shown · non-contrast
Comparison: None.

CLINICAL DATA: Dementia up.  Fell last night.

EXAM:
LEFT FEMUR PORTABLE 2 VIEWS

[t femur proximal ap left]
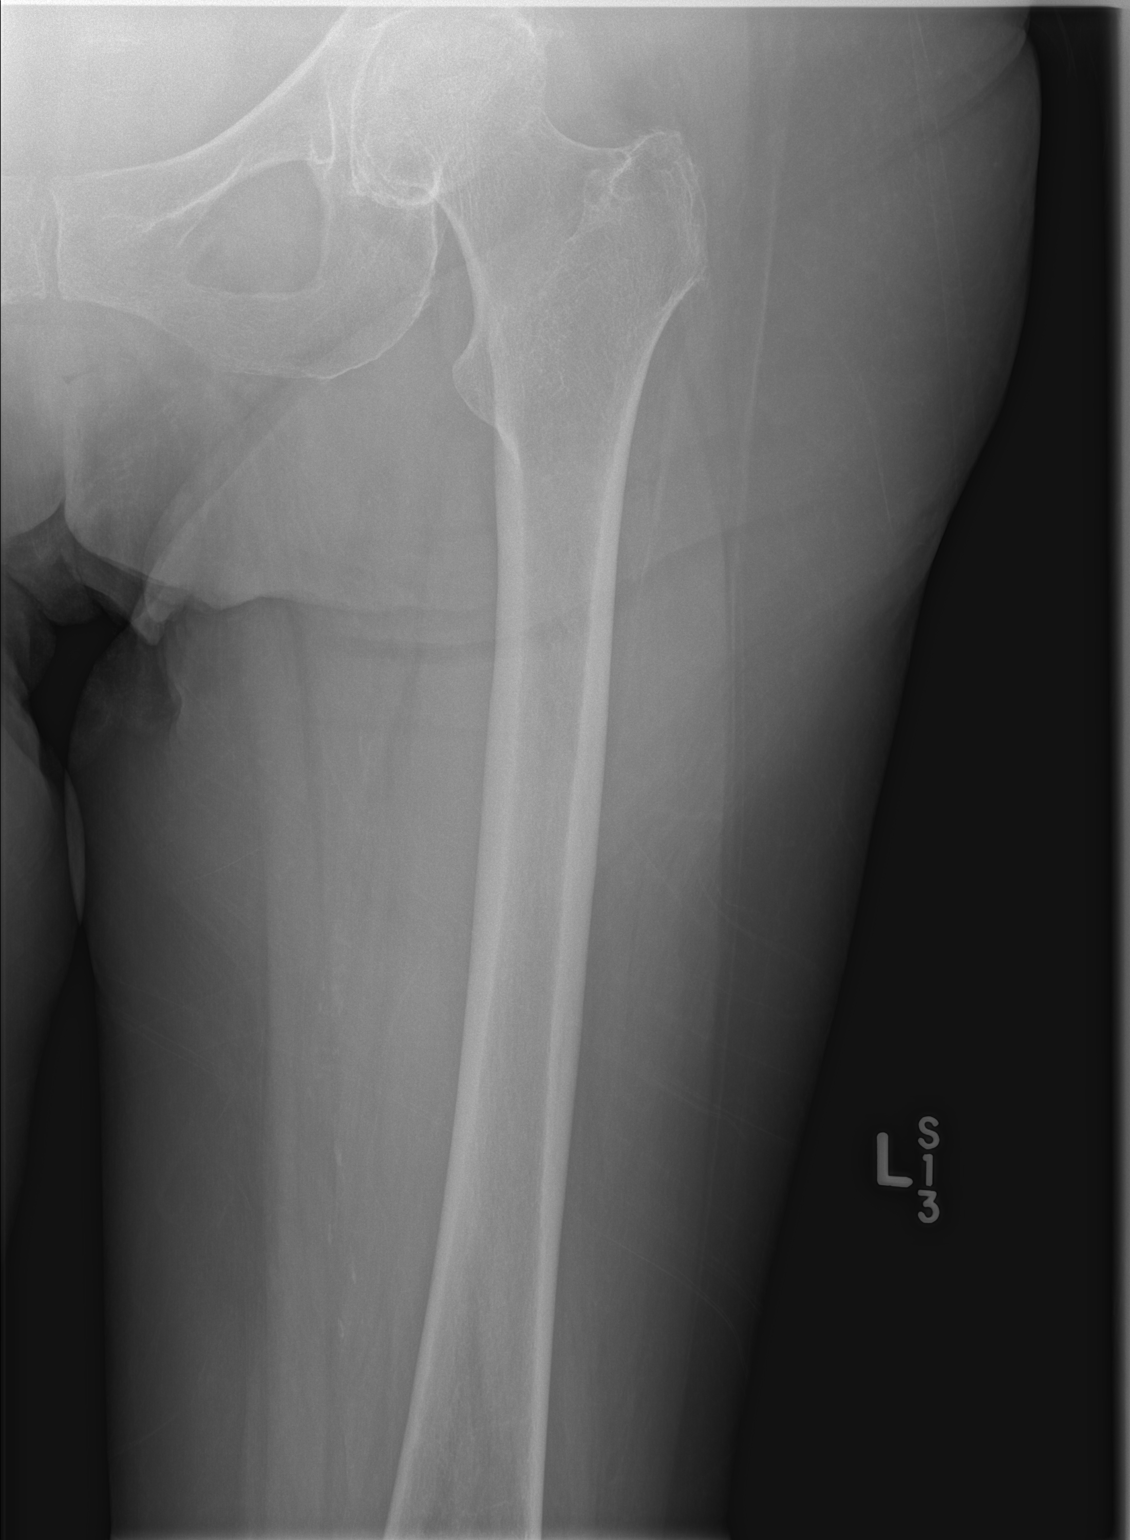

[t femur proximal lat left]
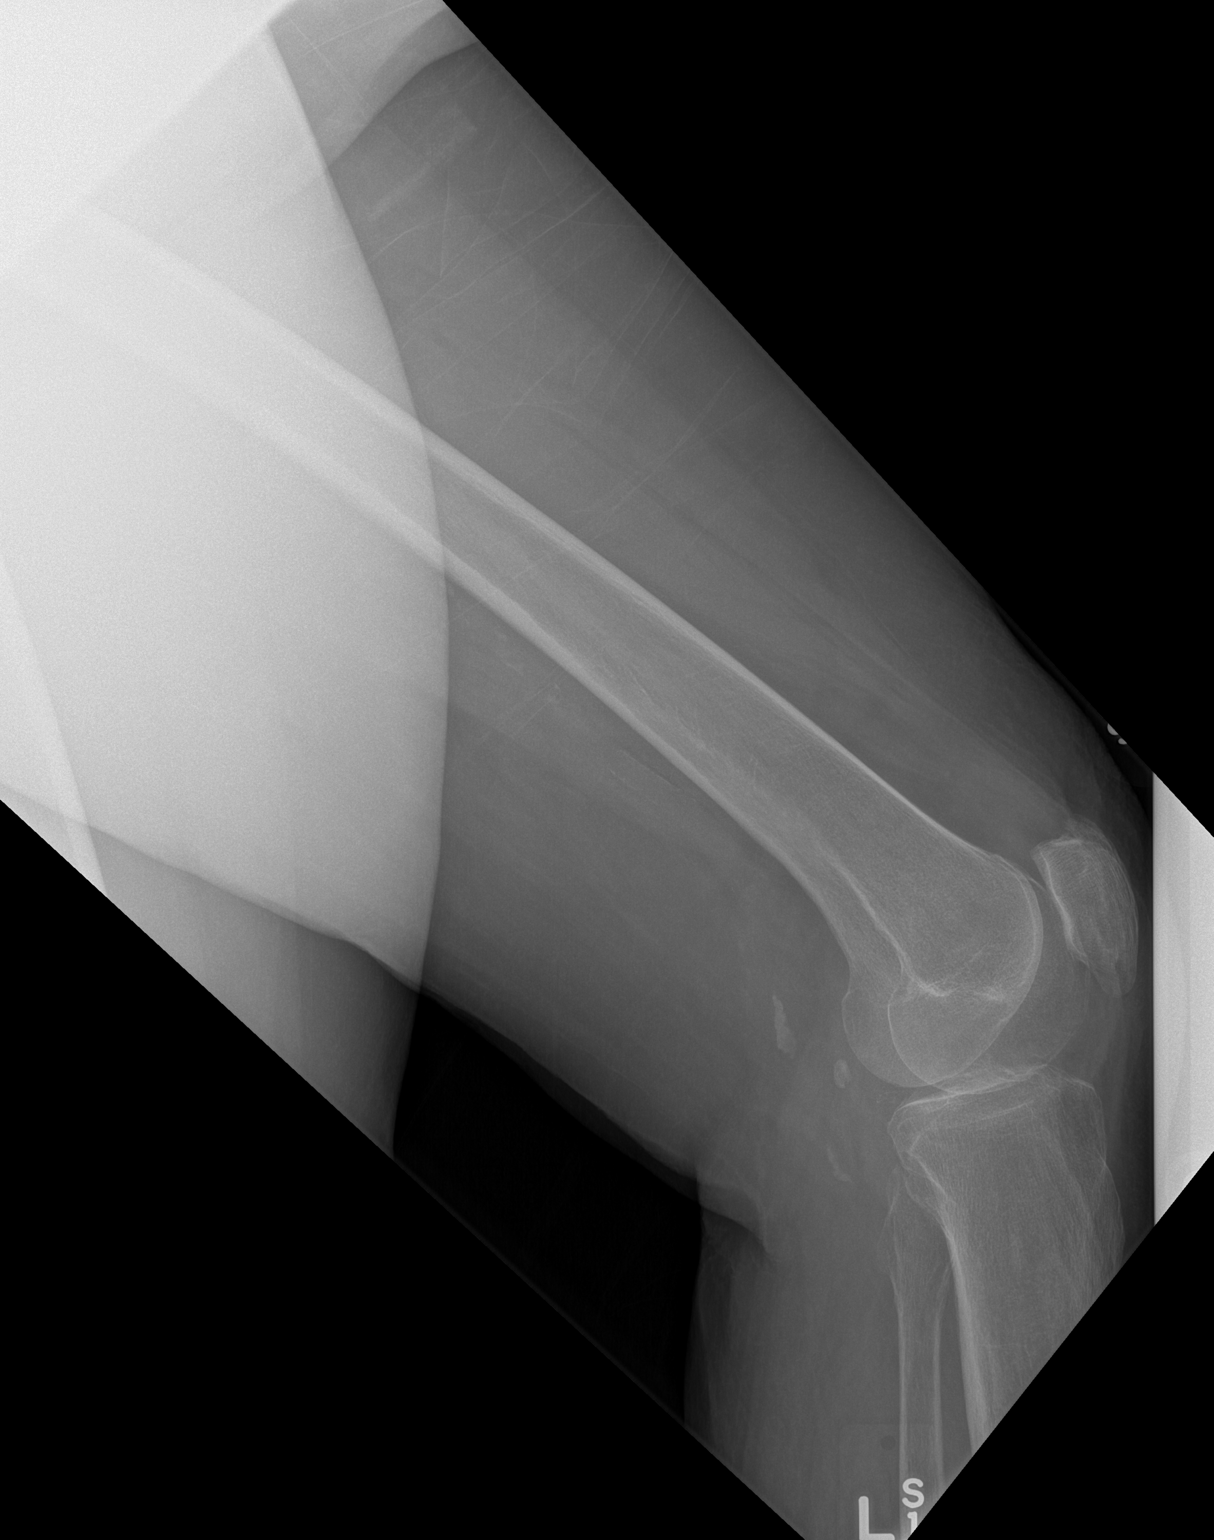

[2 of 2 positions shown; findings below may reference images not displayed]

FINDINGS: There is no evidence of fracture or other focal bone lesions. Soft
tissues are unremarkable.
IMPRESSION: Negative.

## 2017-07-05 IMAGING — CR DG KNEE COMPLETE 4+V*L*
4 series · 4 of 4 positions shown · non-contrast
Comparison: None.

CLINICAL DATA: Unwitnessed fall last night.  Dementia.

EXAM:
LEFT KNEE - COMPLETE 4+ VIEW

[t knee ap left]
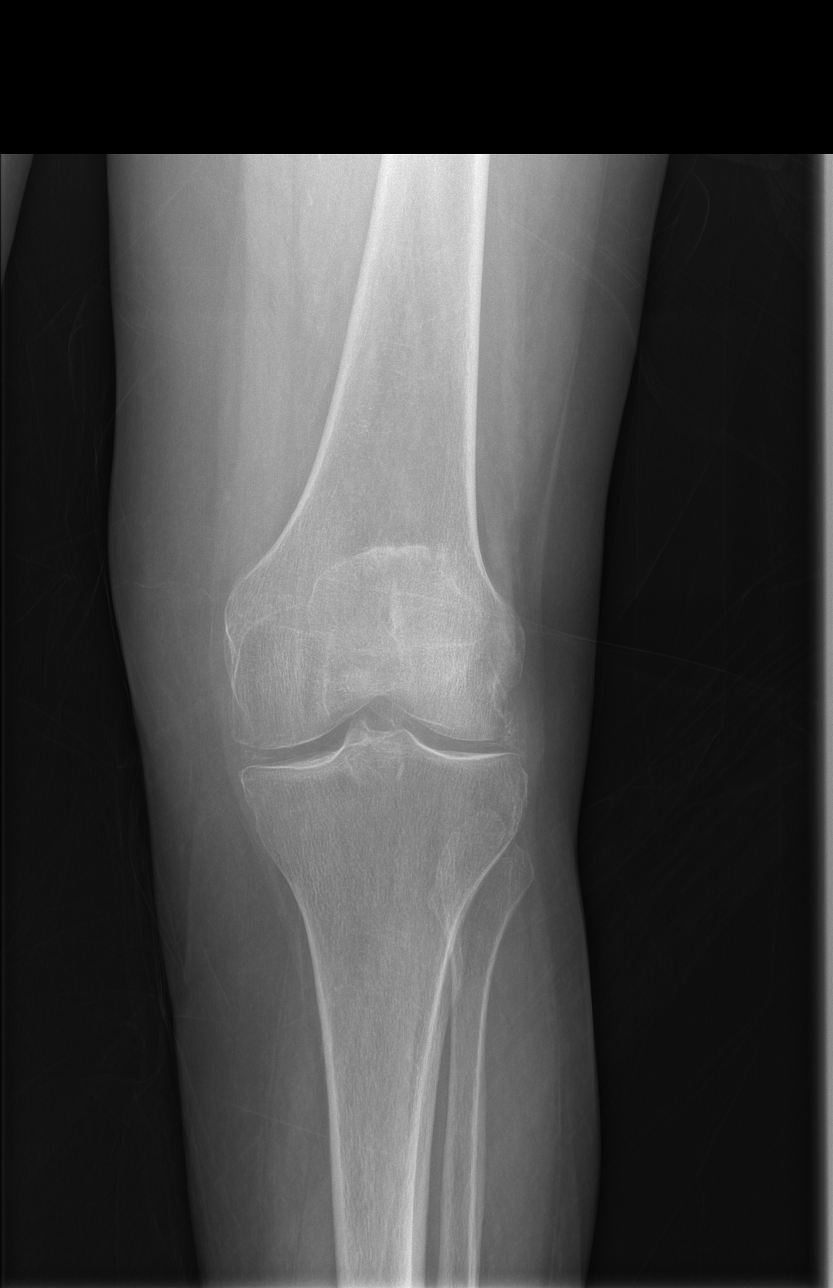

[t knee obl left (1 of 2)]
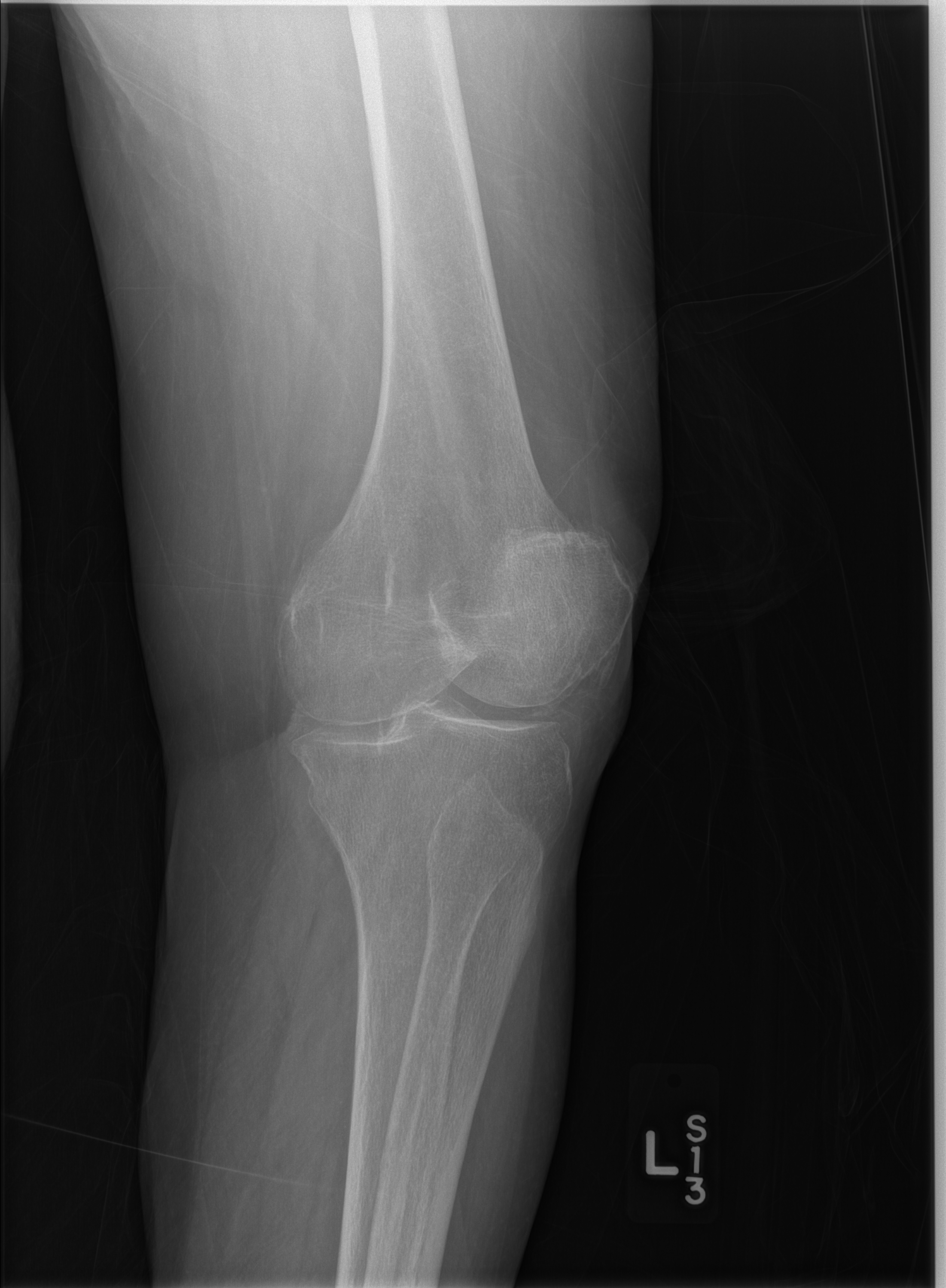

[t knee obl left (2 of 2)]
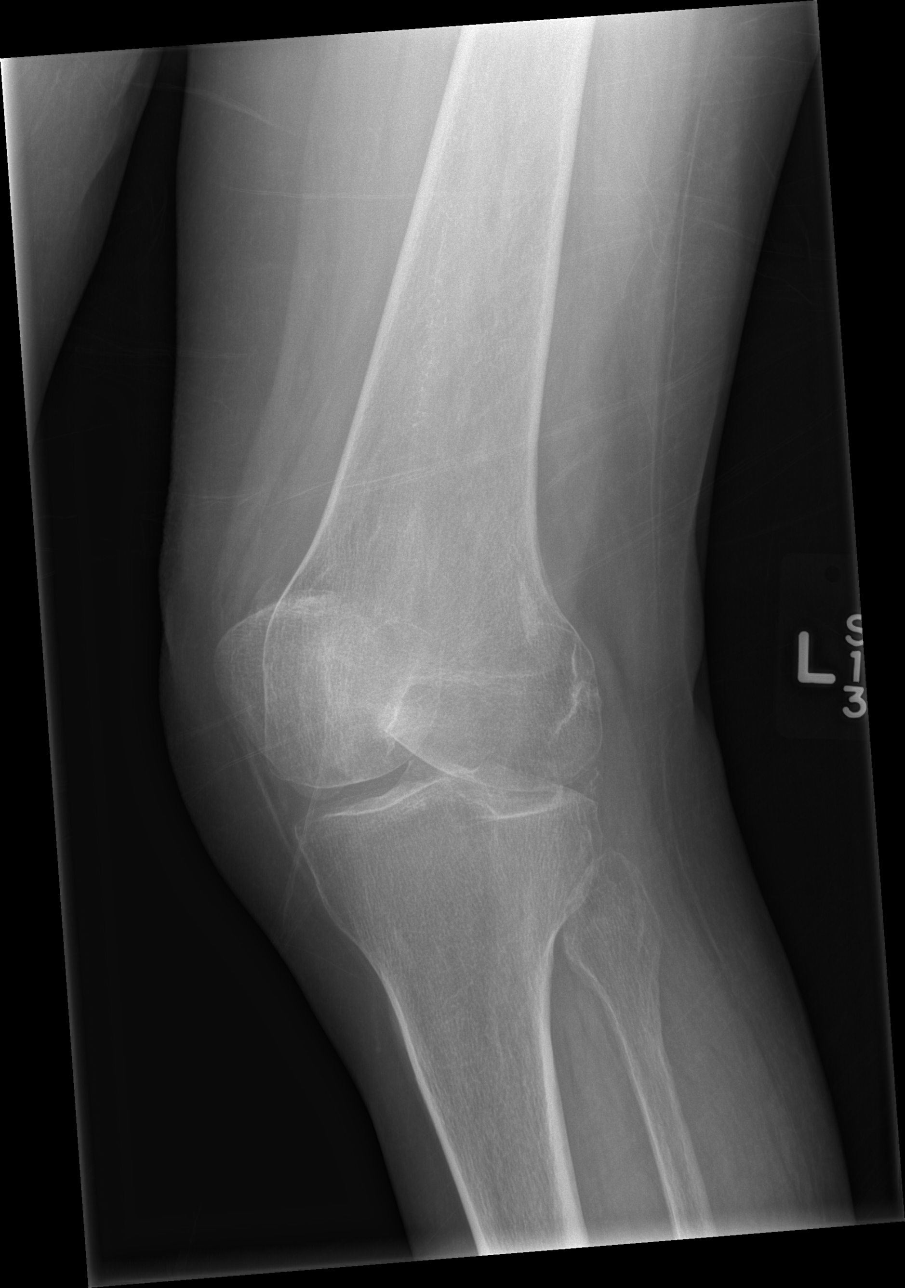

[t knee lat left]
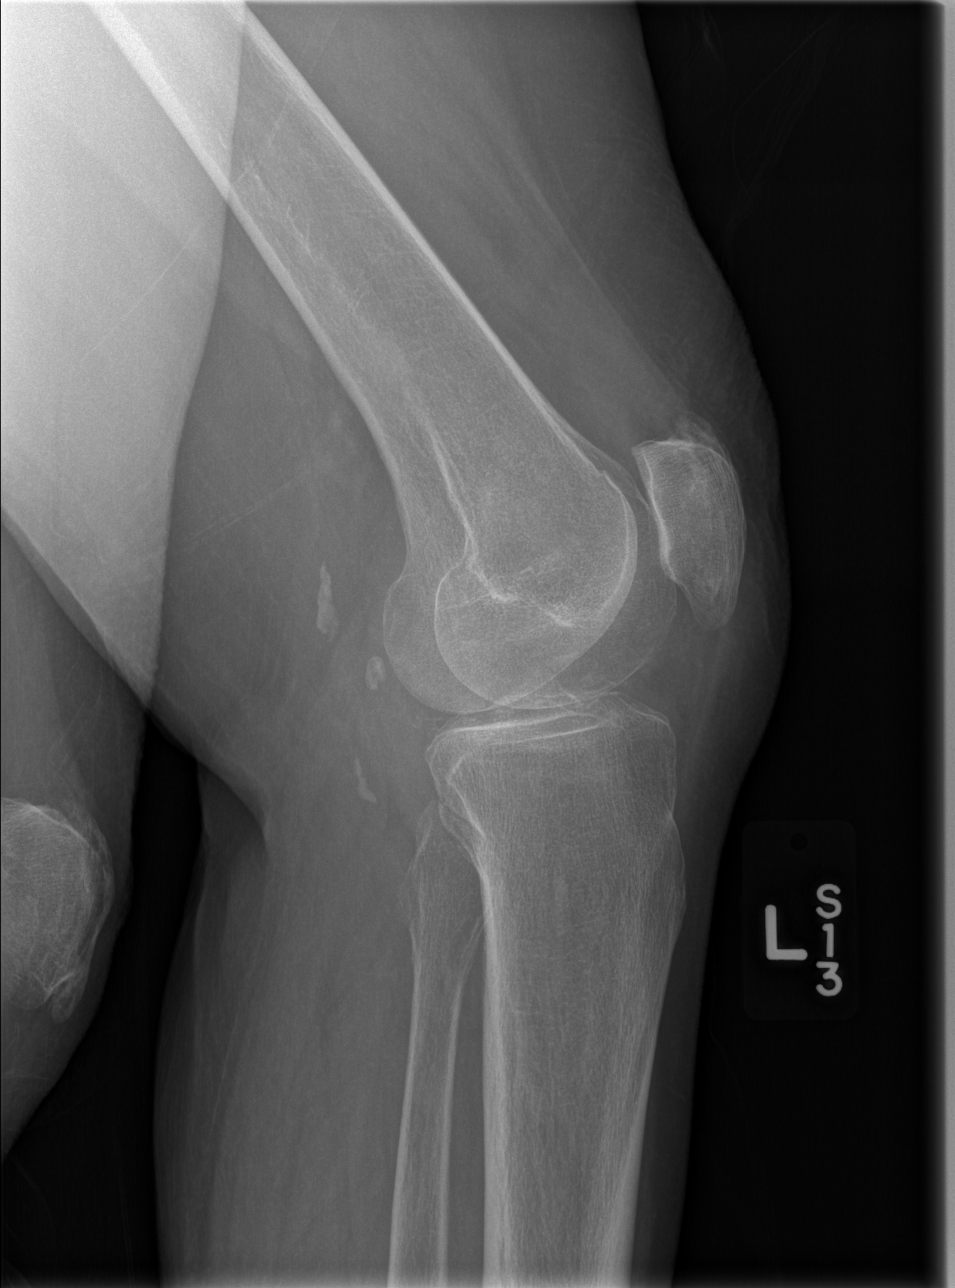

[4 of 4 positions shown; findings below may reference images not displayed]

FINDINGS: No evidence of fracture or dislocation. Small knee joint effusion.
Degenerative chondrocalcinosis of a each. Regional vascular
calcification.
IMPRESSION: No acute or traumatic finding. Small joint effusion. Degenerative
chondrocalcinosis.

## 2017-07-05 IMAGING — CR DG FEMUR 2+V*R*
3 series · 3 of 3 positions shown · non-contrast
Comparison: None.

CLINICAL DATA: Dementia up.  Unwitnessed fall last night.

EXAM:
RIGHT FEMUR 2 VIEWS

[t femur proximal ap right]
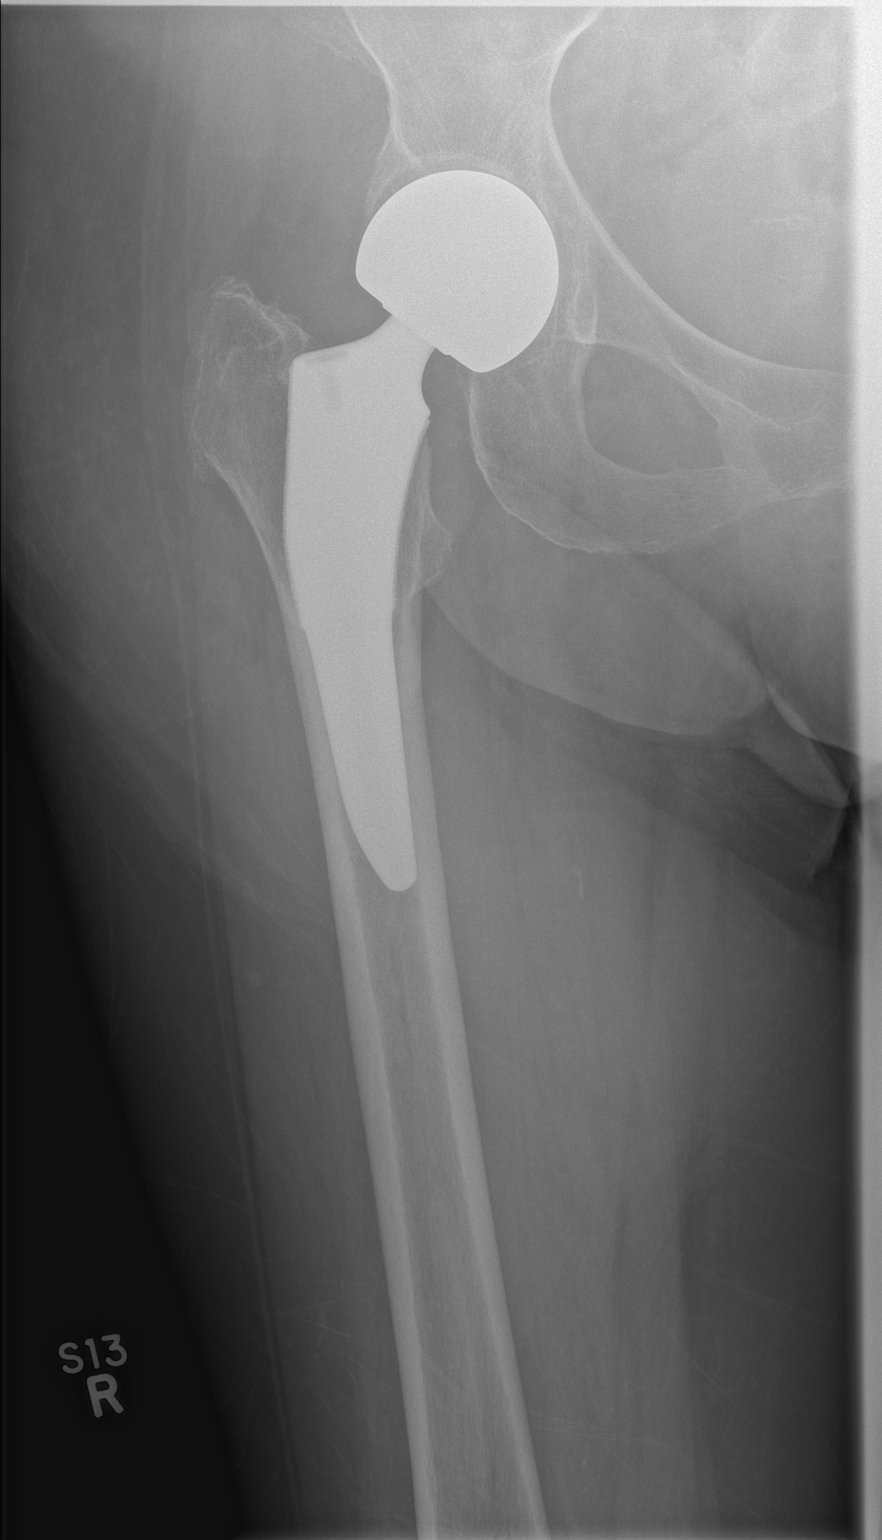

[t femur distal lat right]
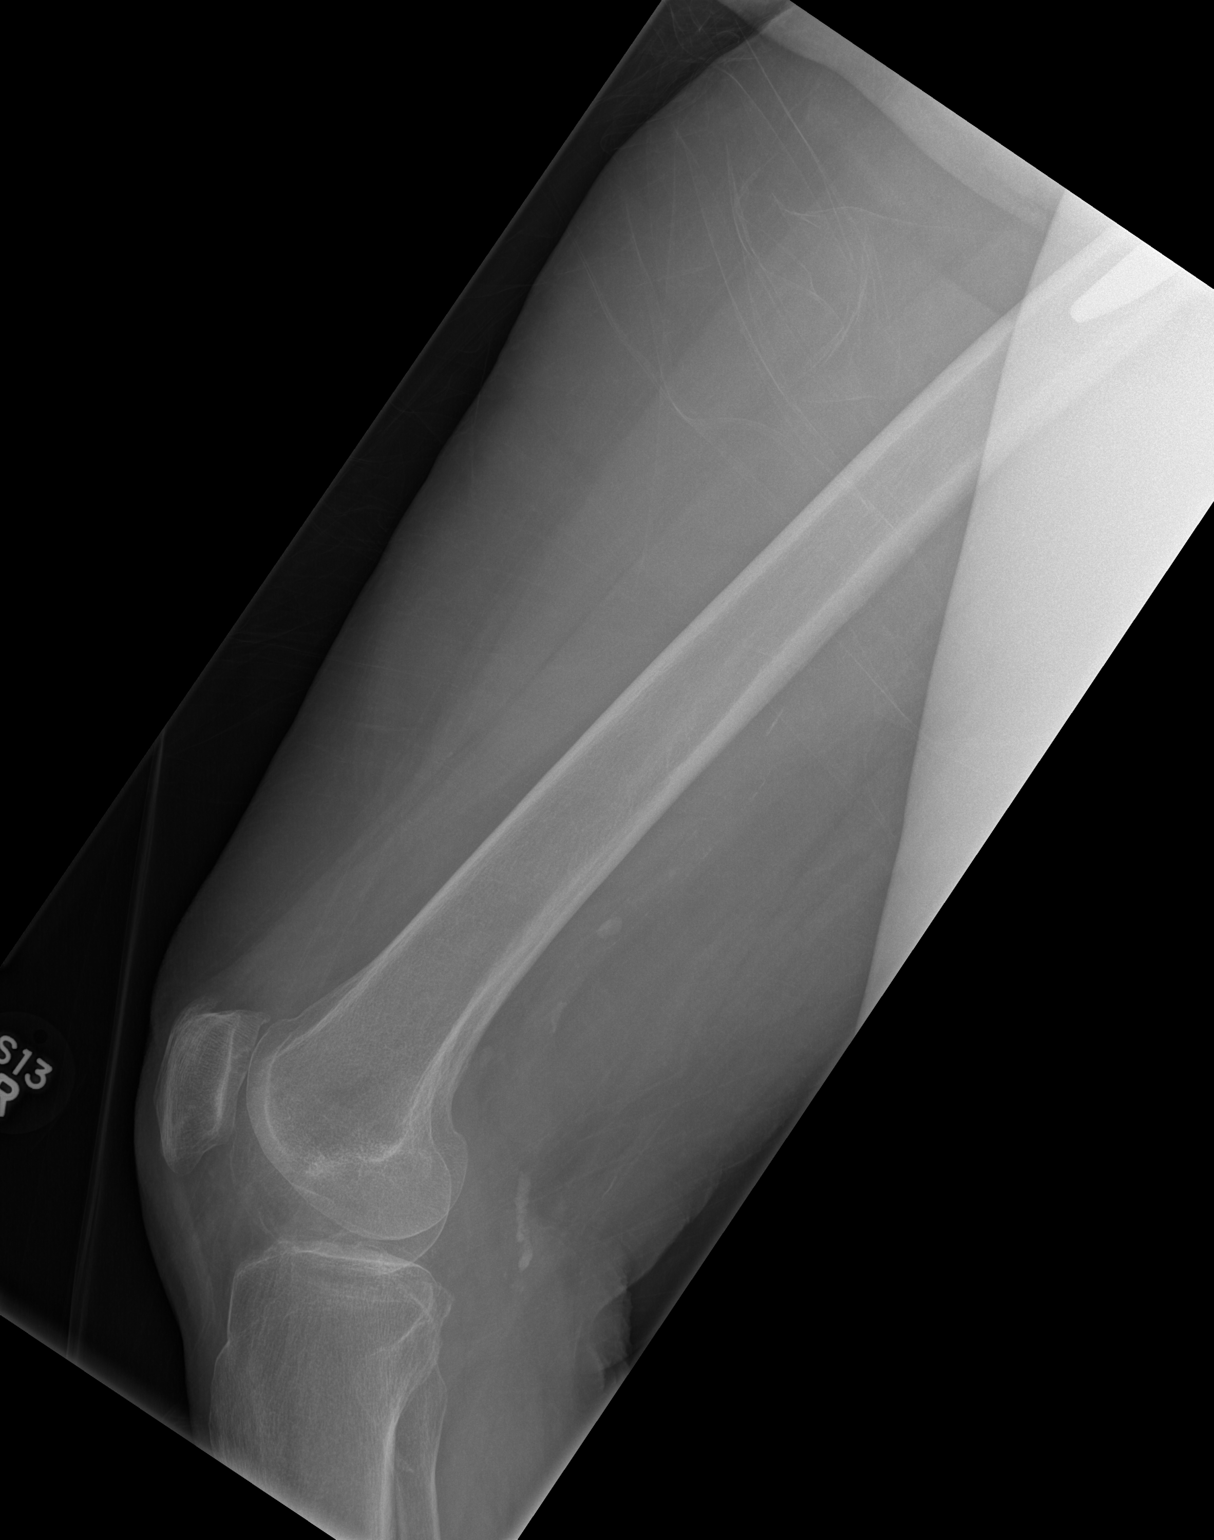

[t femur distal ap right]
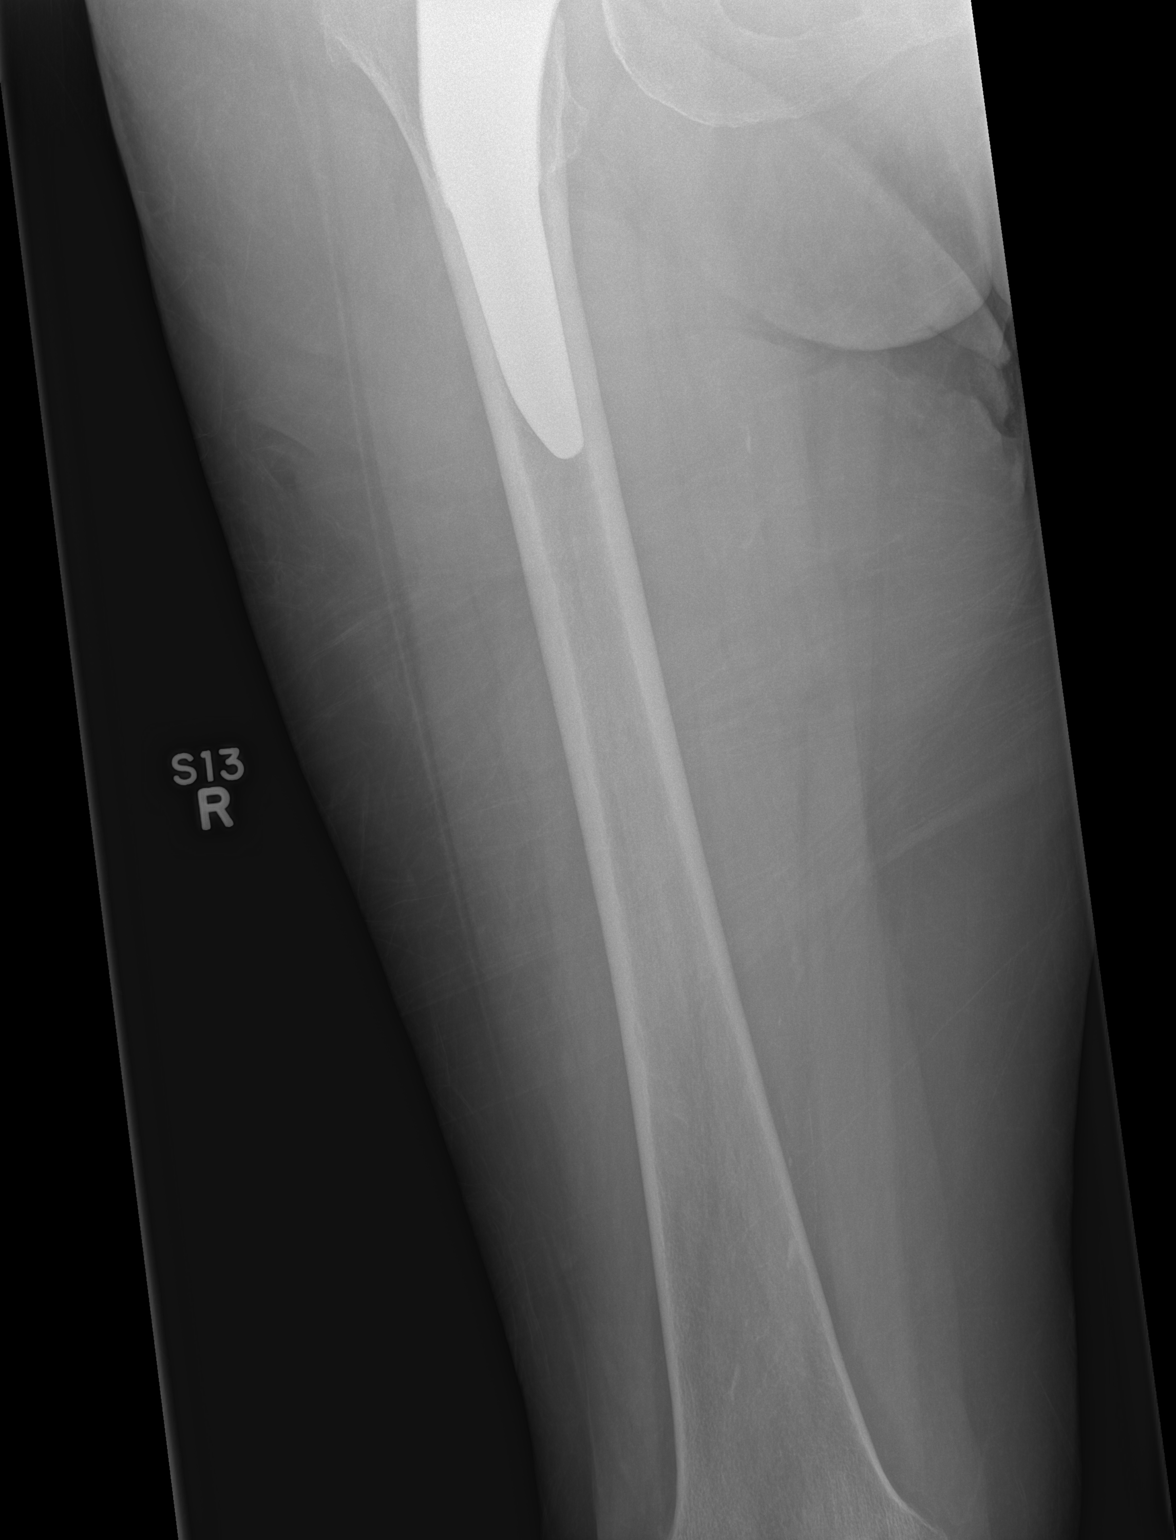

[3 of 3 positions shown; findings below may reference images not displayed]

FINDINGS: Previous bipolar hip replacement. No evidence of complication. No
fracture of the femur.
IMPRESSION: Negative.

## 2017-07-05 IMAGING — CT CT HEAD W/O CM
5 of 8 series · 16 of 47 positions shown, 17 images · non-contrast
Comparison: 04/17/2016.

CLINICAL DATA: Fell.  Altered mental status.

EXAM:
CT HEAD WITHOUT CONTRAST
TECHNIQUE: Contiguous axial images were obtained from the base of the skull
through the vertex without intravenous contrast.

[Series 2: head w/o · axial · non-contrast · 0.45mm/px · z∈[-162,-92]mm · 3 of 30 slices shown]
[im 8/30  brain]
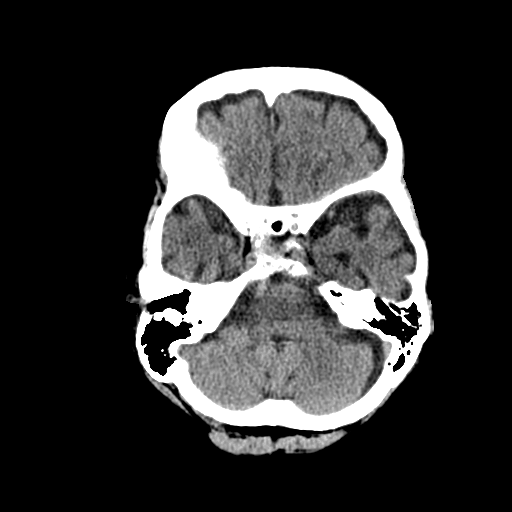
[im 15/30  brain]
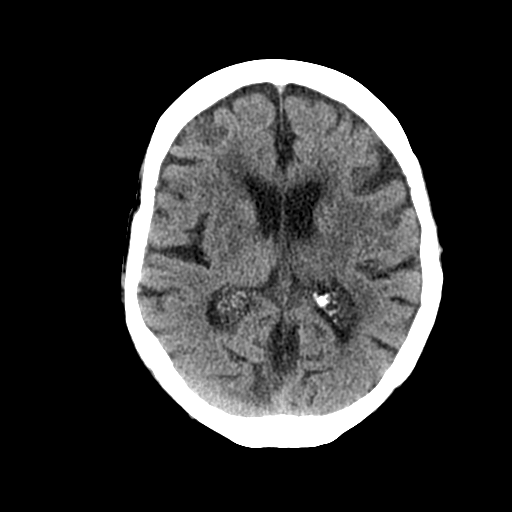
[im 22/30  brain]
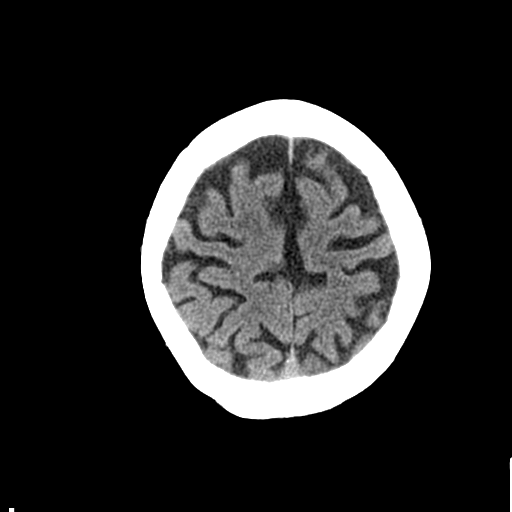

[Series 3: ax head w/o · axial · non-contrast · 0.34mm/px · z∈[-163,-67]mm · 4 of 41 slices shown, 5 images]
[im 9/41  brain]
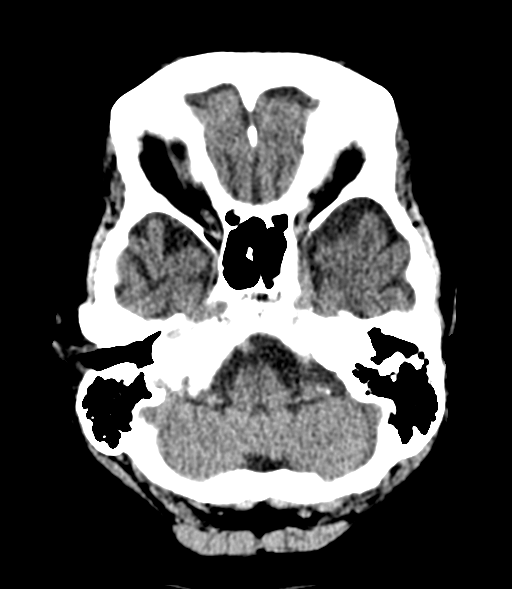
[im 9/41  bone]
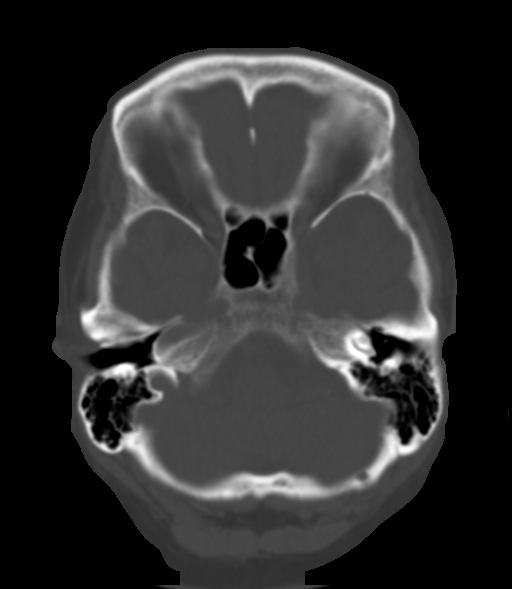
[im 17/41  brain]
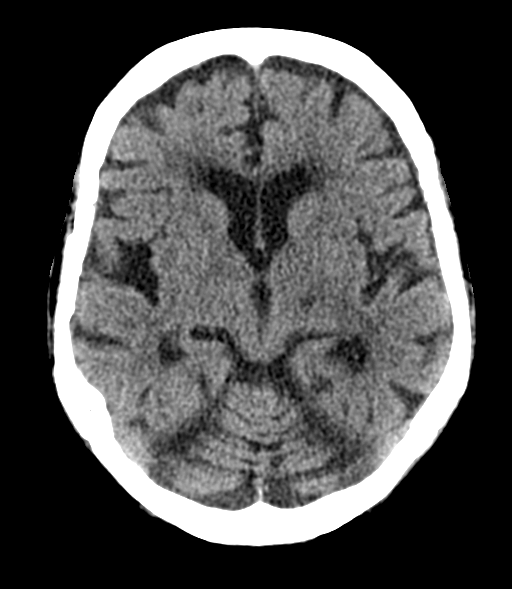
[im 25/41  brain]
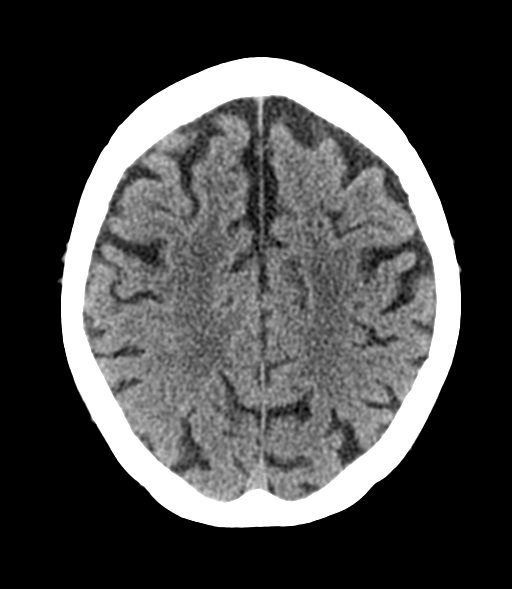
[im 33/41  brain]
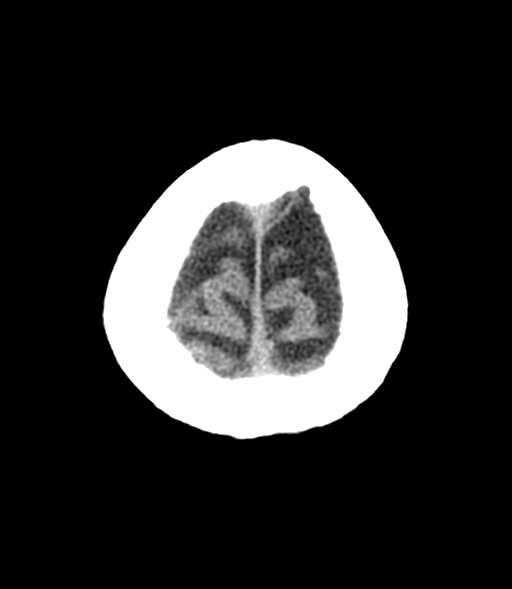

[Series 5: ax bone windows · axial · 0.34mm/px · z∈[-186,-140]mm · 3 of 84 slices shown]
[im 8/84  bone]
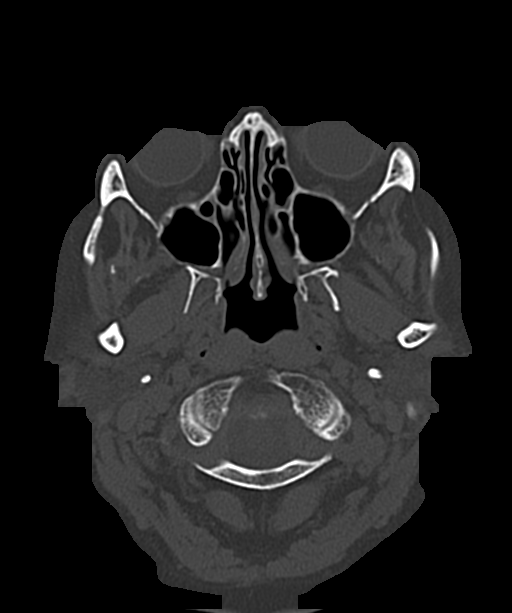
[im 16/84  bone]
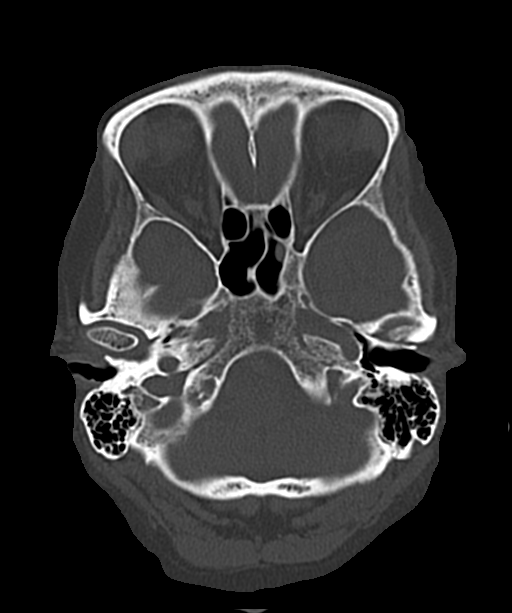
[im 31/84  bone]
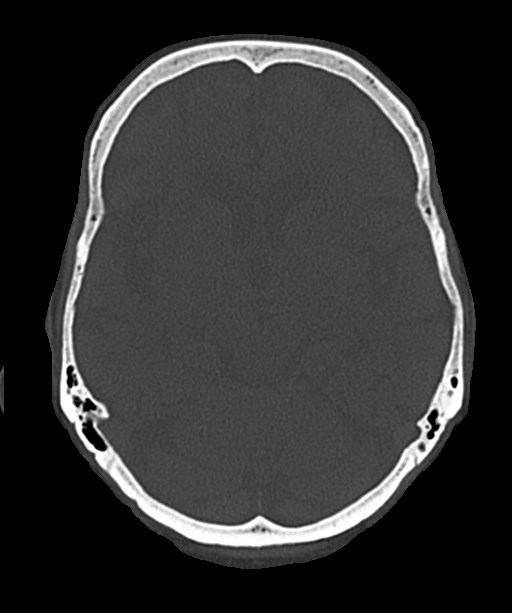

[Series 6: coronal · coronal · 0.31mm/px · 3 of 69 slices shown]
[im 23/69  brain]
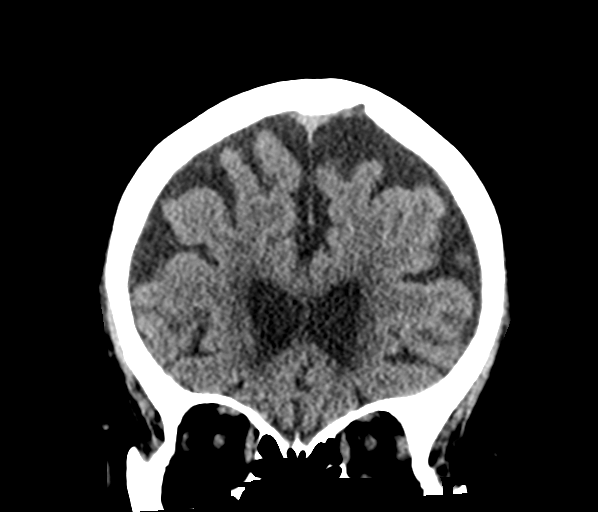
[im 31/69  brain]
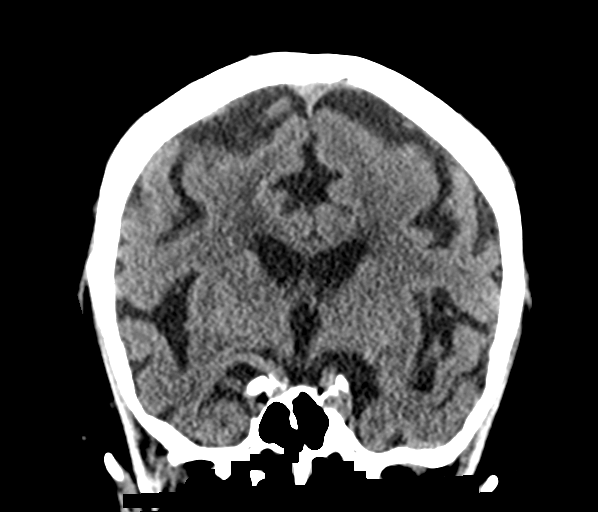
[im 38/69  brain]
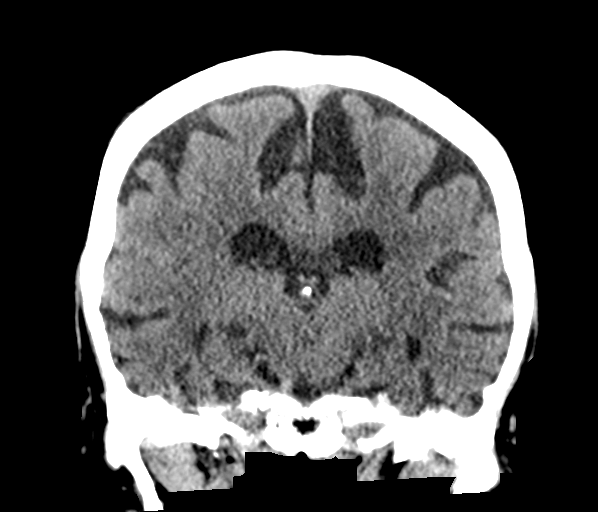

[Series 7: sagittal · sagittal · 0.33mm/px · 3 of 60 slices shown]
[im 20/60  brain]
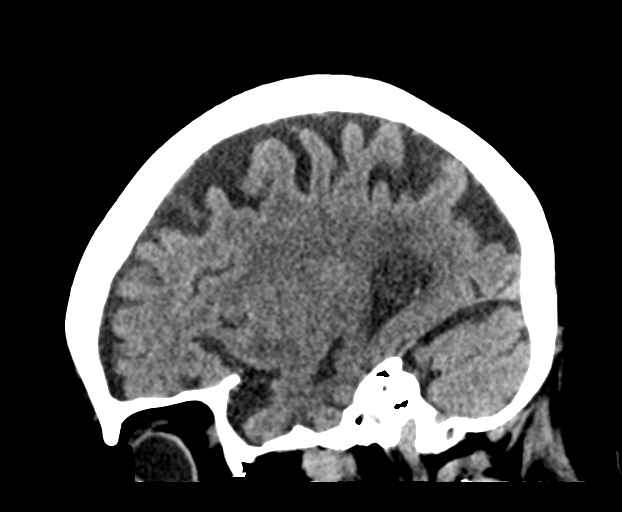
[im 30/60  brain]
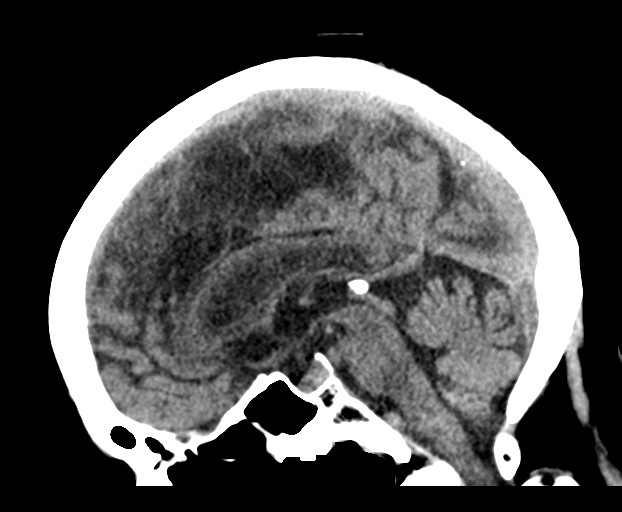
[im 40/60  brain]
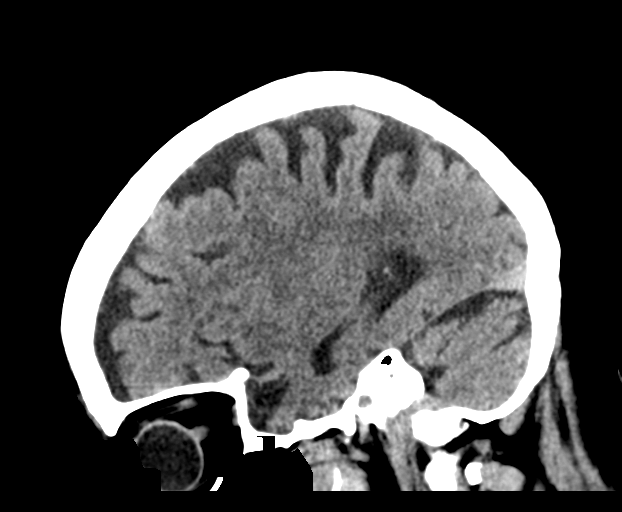

[16 of 47 positions shown; findings below may reference images not displayed]

FINDINGS: Brain: Diffusely enlarged ventricles and subarachnoid spaces. Patchy
white matter low density in both cerebral hemispheres. No
intracranial hemorrhage, mass lesion or CT evidence of acute
infarction.

Vascular: No hyperdense vessel or unexpected calcification.

Skull: Normal. Negative for fracture or focal lesion.

Sinuses/Orbits: No acute finding.

Other: None.
IMPRESSION: 1. No acute abnormality.
2. Stable atrophy chronic small vessel white matter ischemic
changes.

## 2017-07-05 IMAGING — CR DG HIP (WITH OR WITHOUT PELVIS) 3-4V BILAT
5 series · 5 of 5 positions shown · non-contrast
Comparison: None.

CLINICAL DATA: [AGE]/o F; in unwitnessed fall last night.
Complaining of bilateral hip pain greater on the left.

EXAM:
DG HIP (WITH OR WITHOUT PELVIS) 3-4V BILAT

[t pelvis ap]
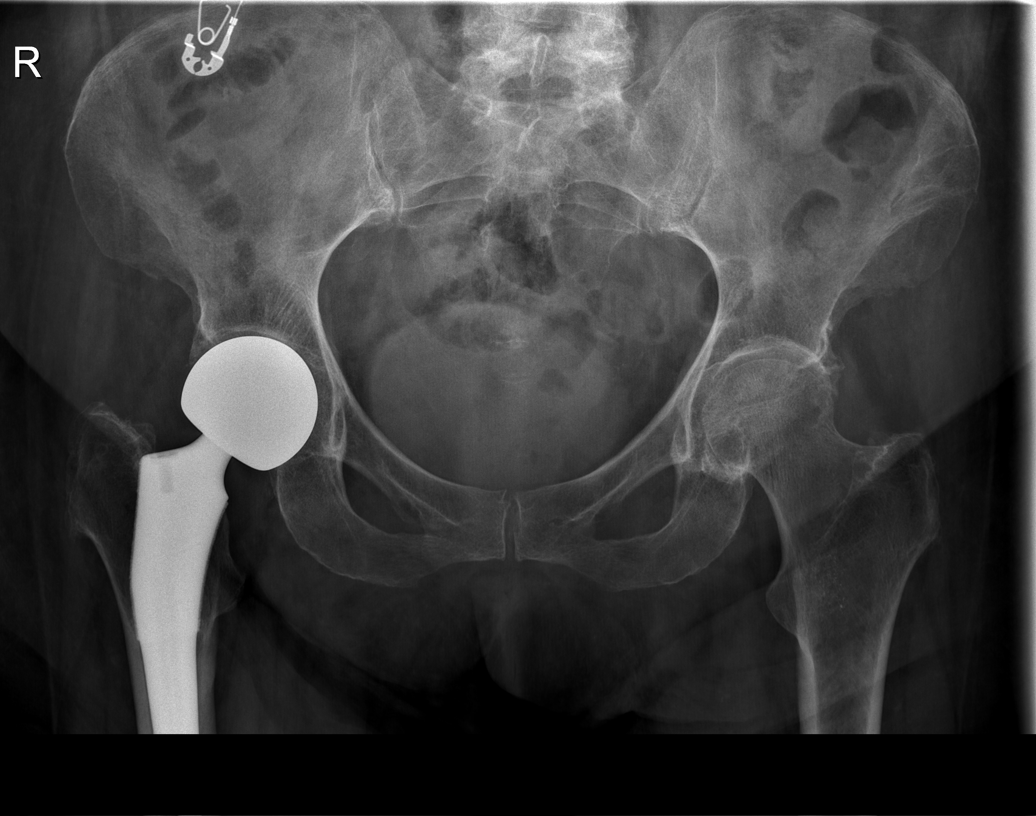

[t hip ap left]
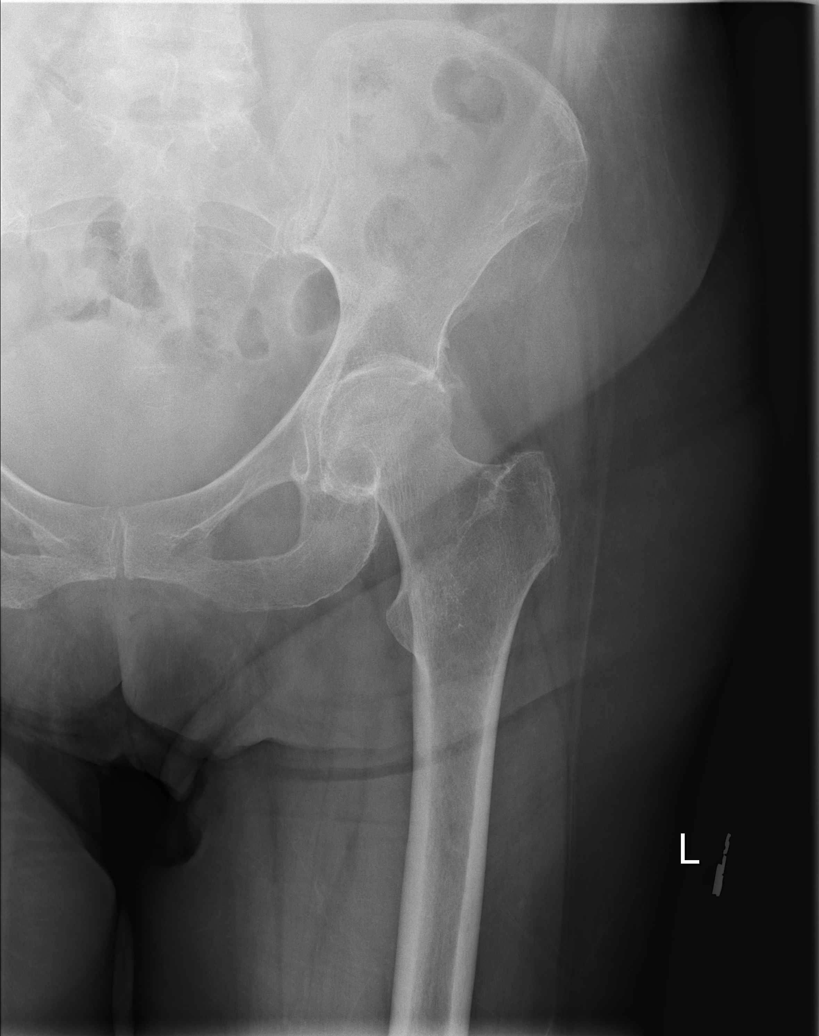

[t hip ap right]
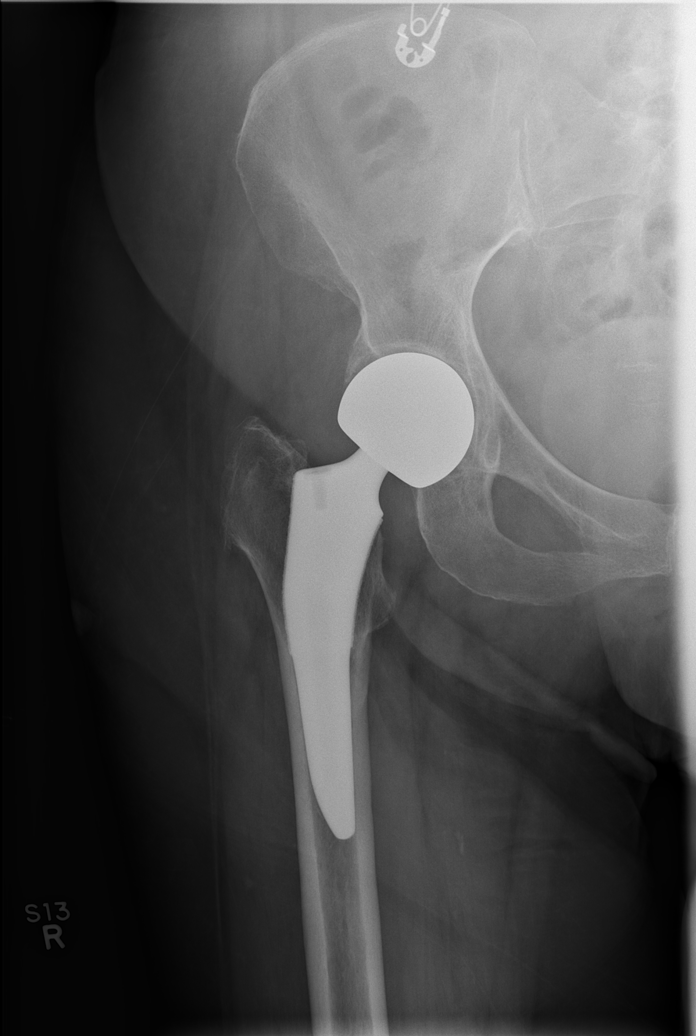

[t hip frog leg right]
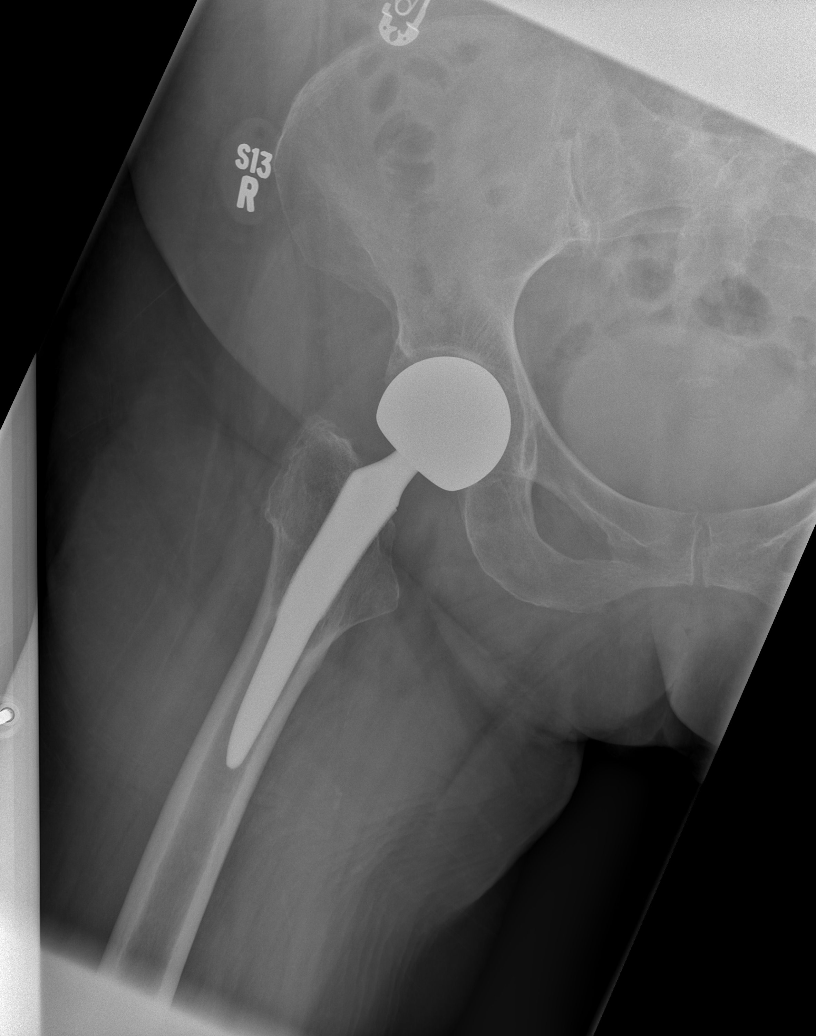

[t hip frog leg left]
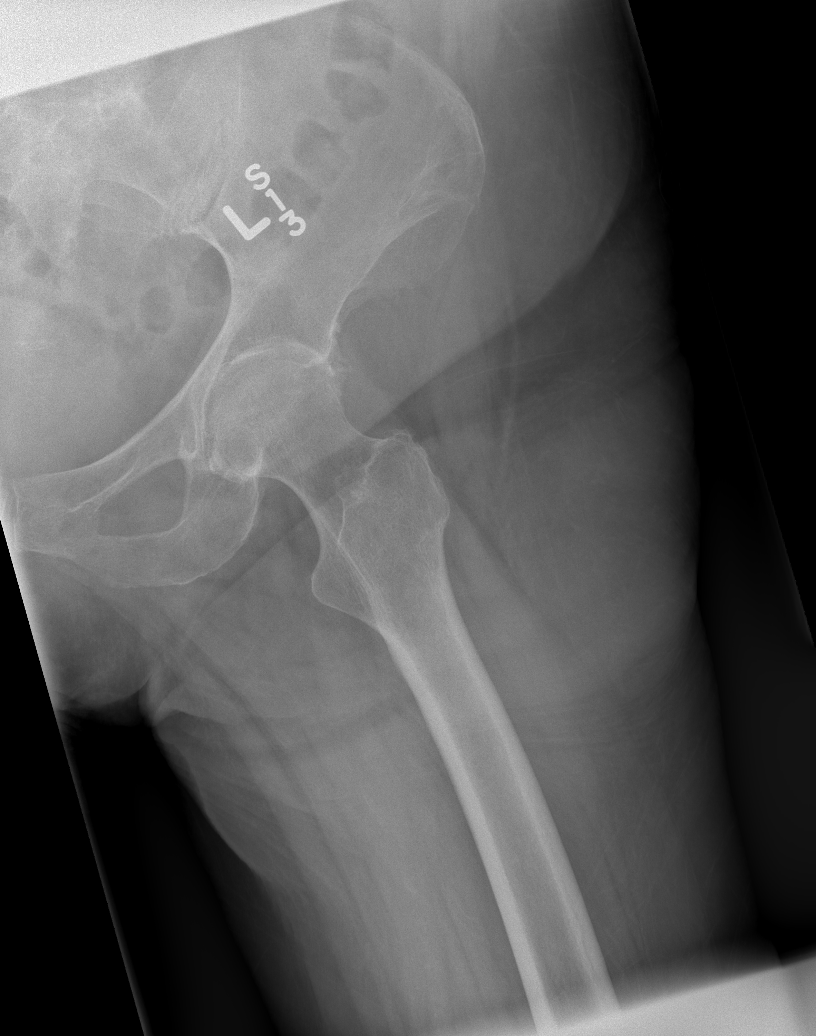

[5 of 5 positions shown; findings below may reference images not displayed]

FINDINGS: No acute fracture or dislocation is identified. Right hip hemi
arthroplasty is unchanged in position without apparent hardware
related complication. Moderate osteoarthrosis of the left hip with
joint space narrowing, fibrocystic degeneration of articular
surfaces, and periarticular osteophytes. Visualized symphysis pubis
and sacroiliac joints are well maintained.
IMPRESSION: No acute fracture or dislocation identified.

By: Kaki Jim M.D.

## 2017-07-05 IMAGING — CR DG HIP (WITH OR WITHOUT PELVIS) 2-3V*L*
3 series · 3 of 3 positions shown · non-contrast
Comparison: 03/23/2015

CLINICAL DATA: Fell at home this morning.

EXAM:
DG HIP (WITH OR WITHOUT PELVIS) 2-3V LEFT

[w hip lat left]
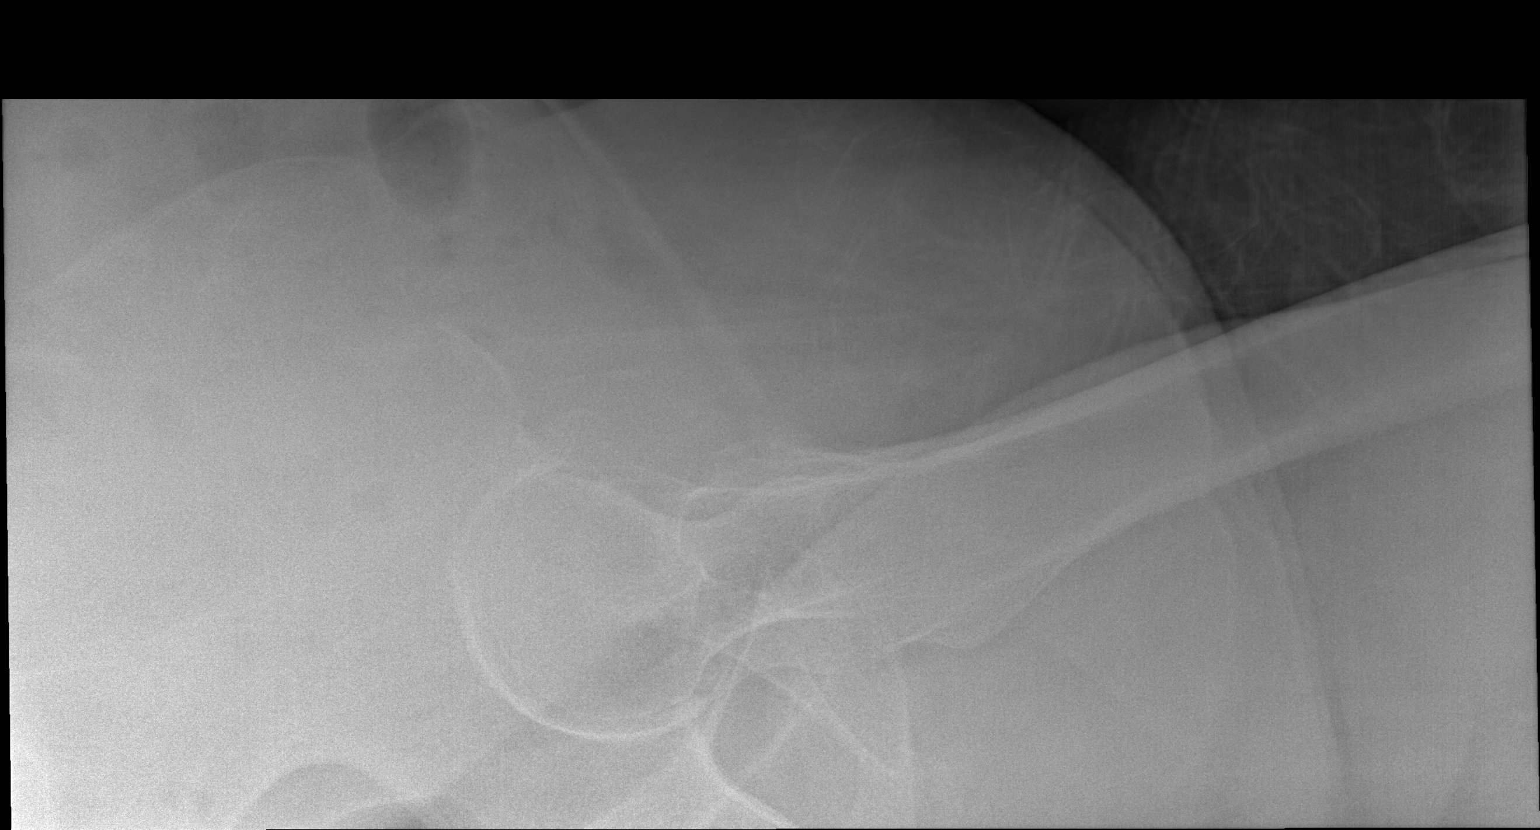

[x pelvis]
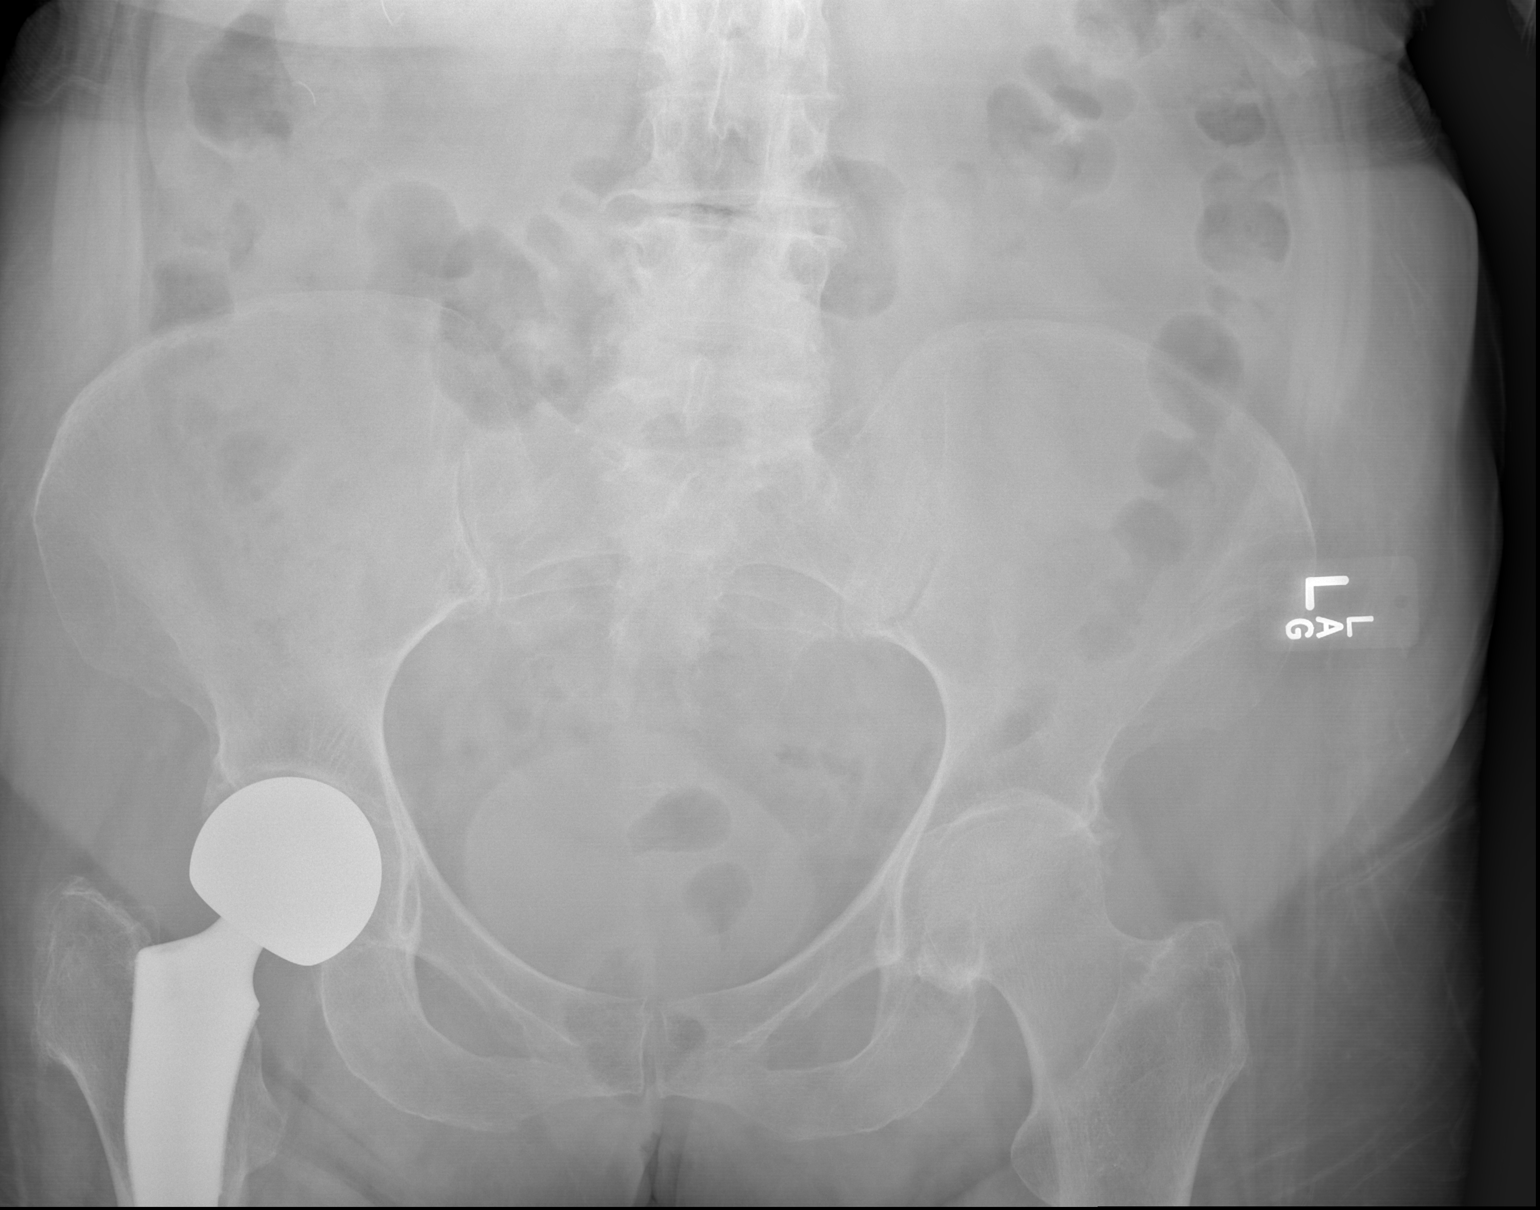

[x hip ap left]
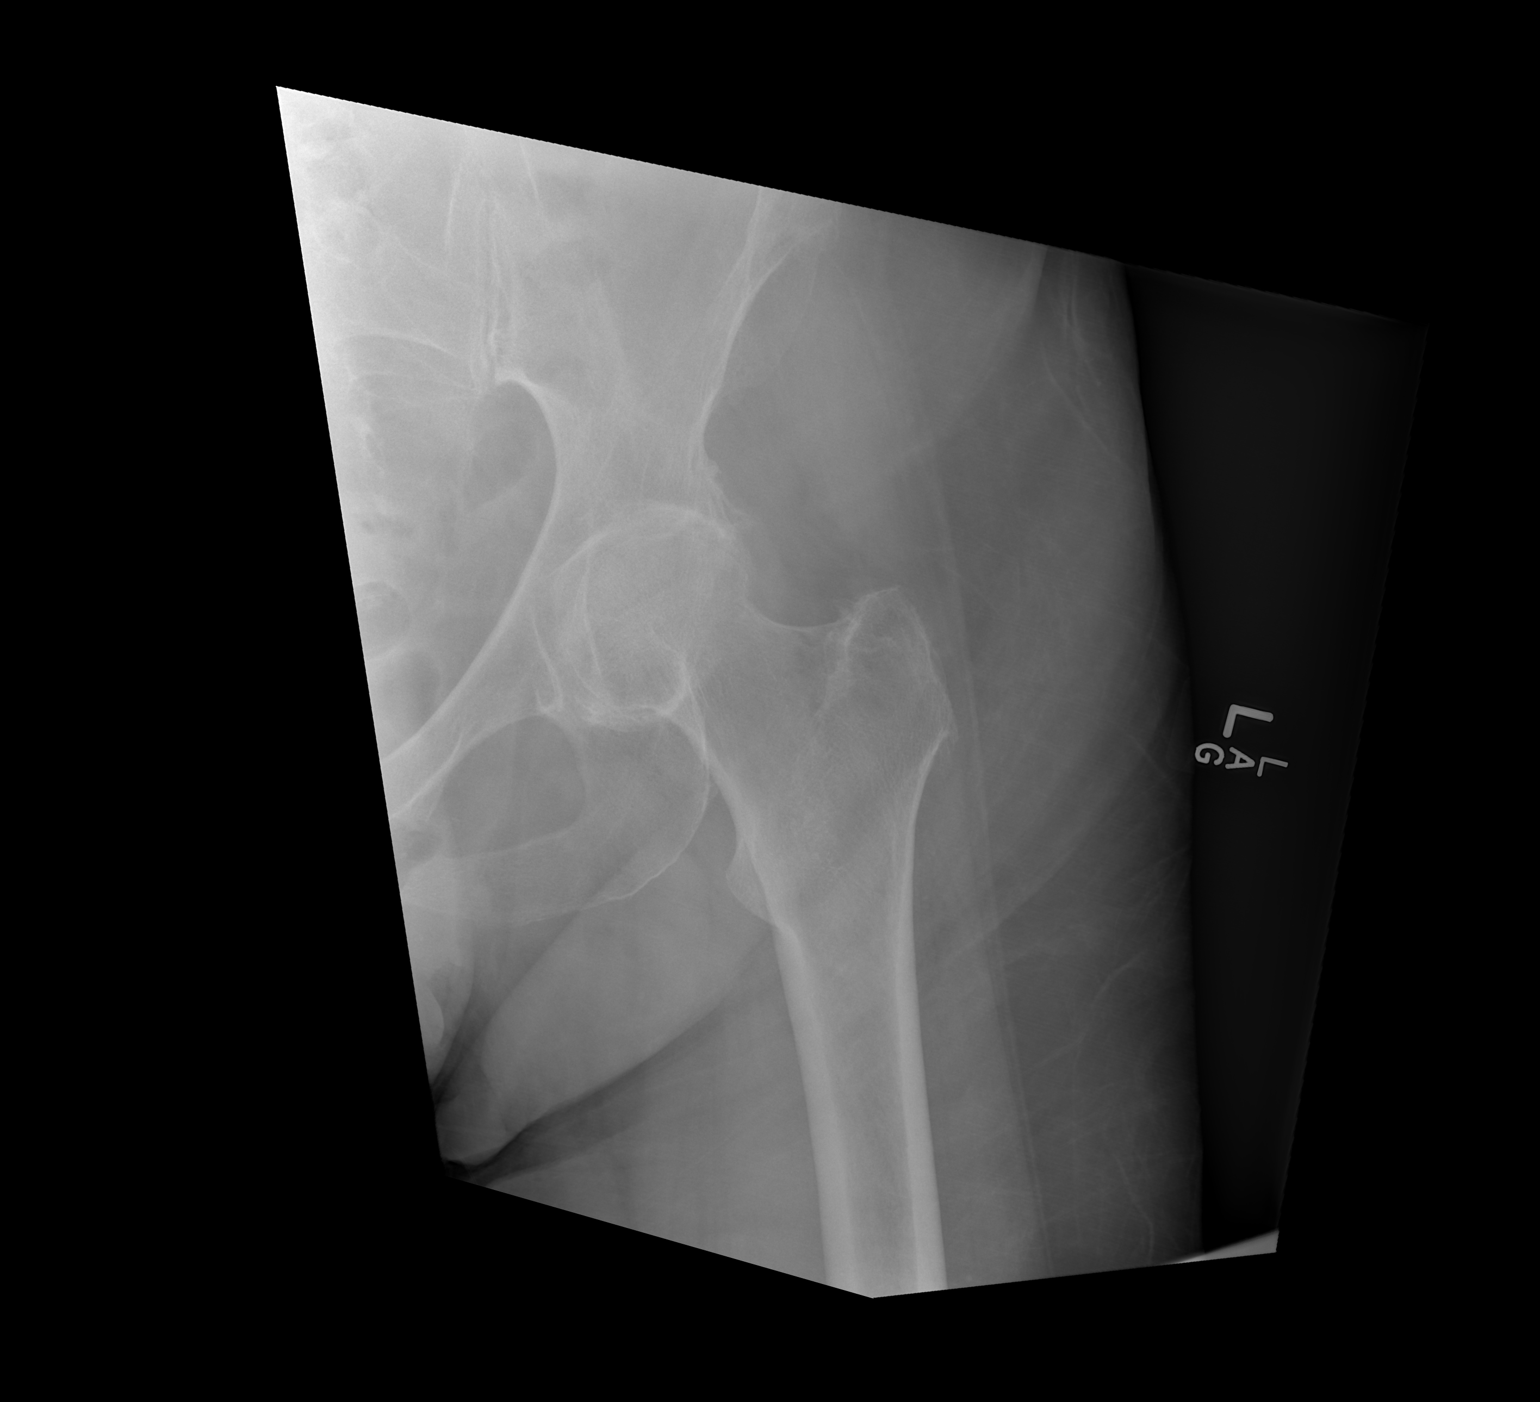

[3 of 3 positions shown; findings below may reference images not displayed]

FINDINGS: There is no evidence of hip fracture or dislocation. There is no
evidence of arthropathy or other focal bone abnormality.
IMPRESSION: Negative.

## 2017-07-05 IMAGING — CR DG TIBIA/FIBULA 2V*R*
4 series · 4 of 4 positions shown · non-contrast
Comparison: None.

CLINICAL DATA: Dementia.  Unwitnessed fall last night.

EXAM:
RIGHT TIBIA AND FIBULA - 2 VIEW

[t tib-fib lat right (1 of 2)]
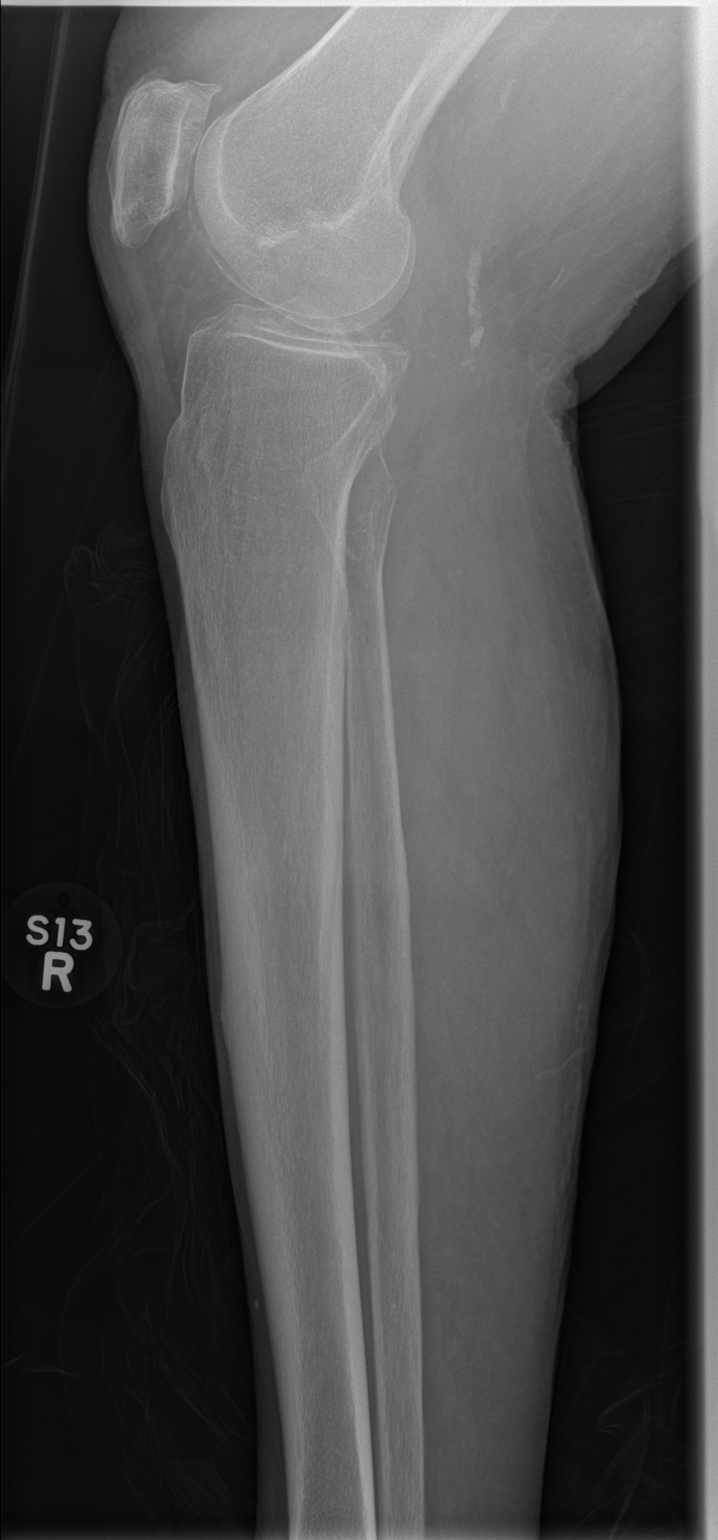

[t tib-fib lat right (2 of 2)]
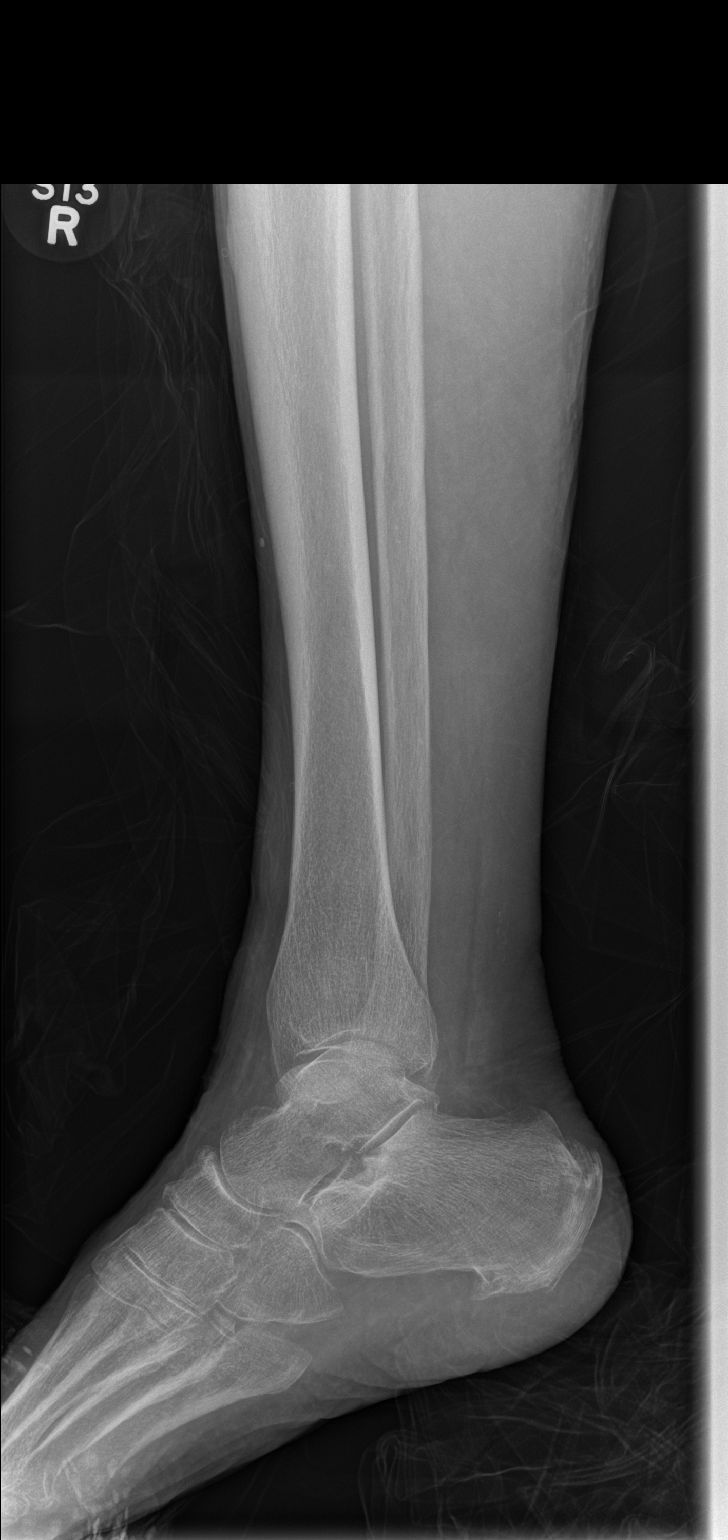

[t tib-fib ap right (1 of 2)]
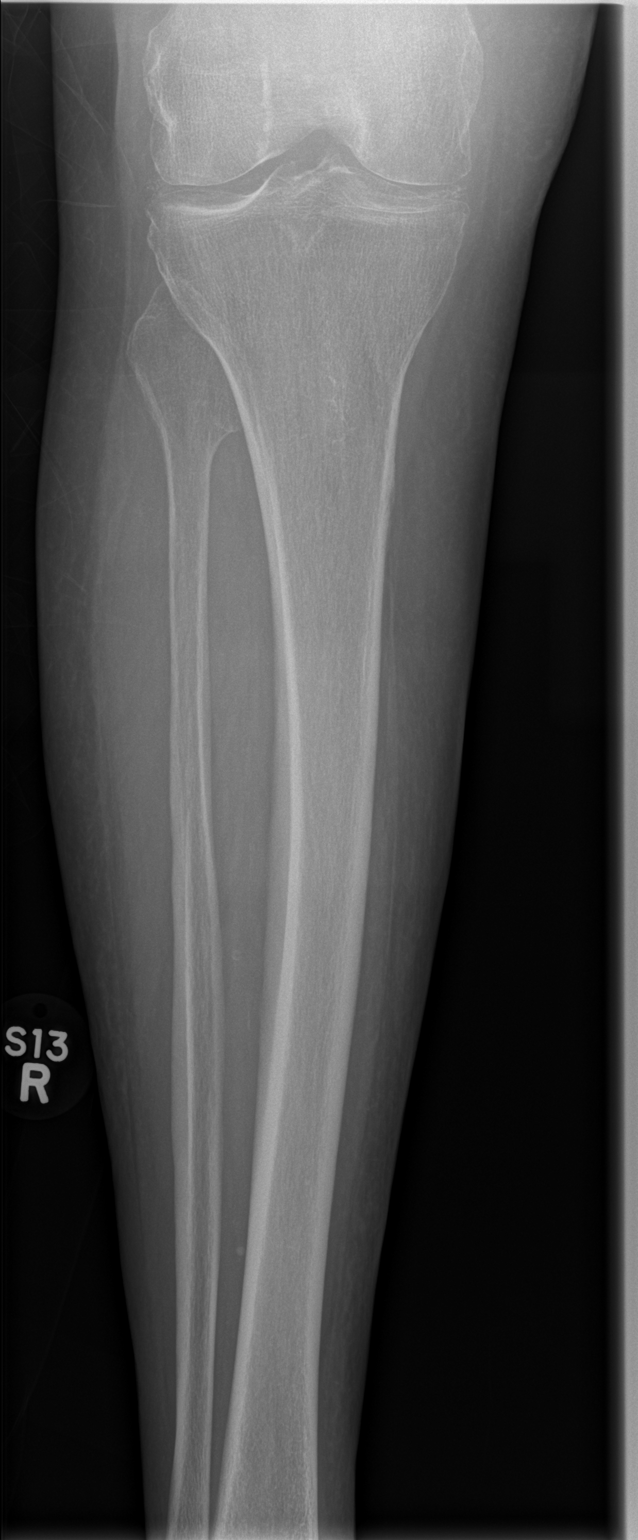

[t tib-fib ap right (2 of 2)]
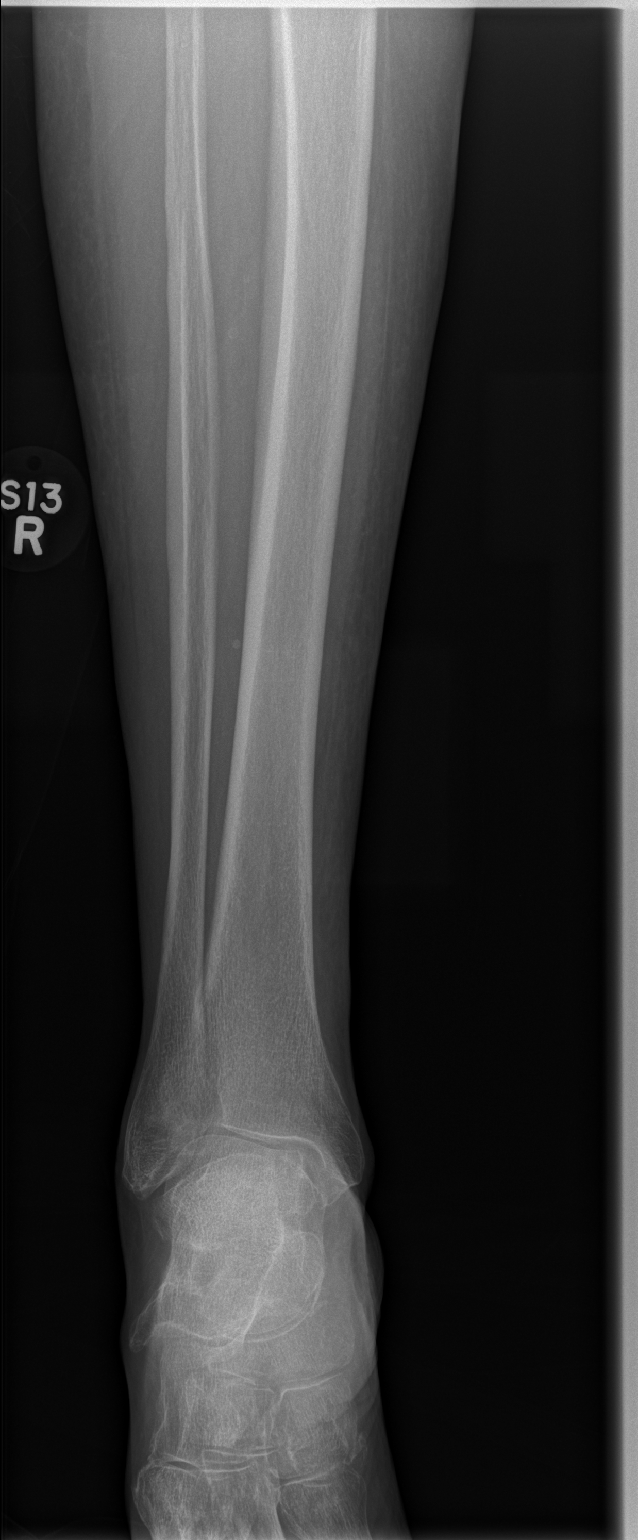

[4 of 4 positions shown; findings below may reference images not displayed]

FINDINGS: There is no evidence of fracture or other focal bone lesions. Soft
tissues are unremarkable.
IMPRESSION: Negative.

## 2017-07-05 IMAGING — CT CT HEAD W/O CM
2 of 8 series · 5 of 33 positions shown, 6 images · non-contrast
Comparison: CT of the head and cervical spine performed 03/27/2016

CLINICAL DATA: Status post unwitnessed fall, with concern for head
or cervical spine injury. Initial encounter.

EXAM:
CT HEAD WITHOUT CONTRAST
CT CERVICAL SPINE WITHOUT CONTRAST
TECHNIQUE: Multidetector CT imaging of the head and cervical spine was
performed following the standard protocol without intravenous
contrast. Multiplanar CT image reconstructions of the cervical spine
were also generated.

[Series 7: c-spine st · axial · 0.28mm/px · z∈[-240,-186]mm · 2 of 81 slices shown]
[im 27/81  soft-tissue]
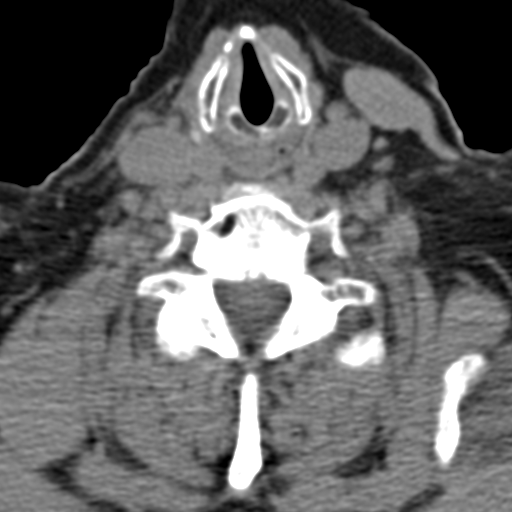
[im 54/81  soft-tissue]
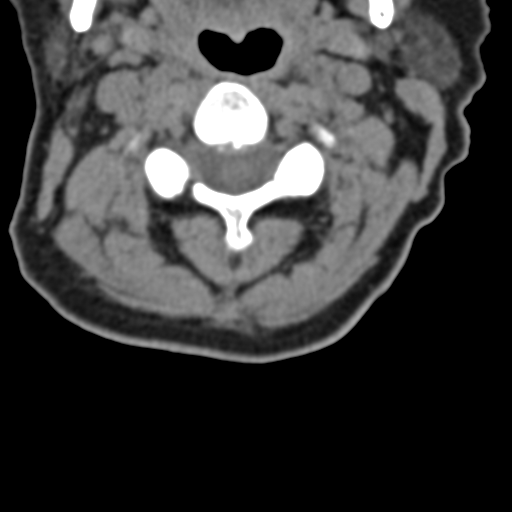

[Series 9: axial reformats · axial · 0.20mm/px · z∈[-274,-195]mm · 3 of 93 slices shown, 4 images]
[im 24/93  soft-tissue]
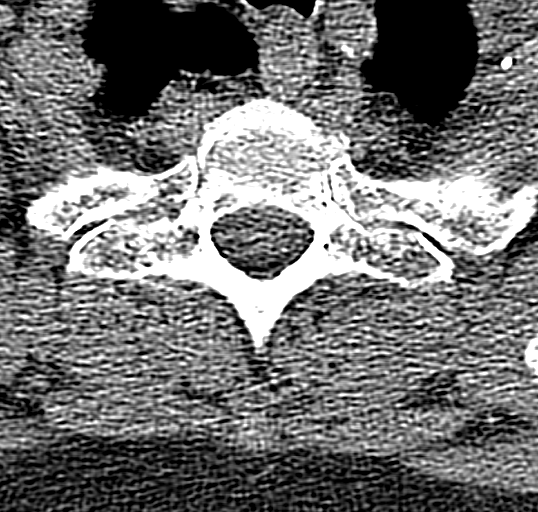
[im 24/93  bone]
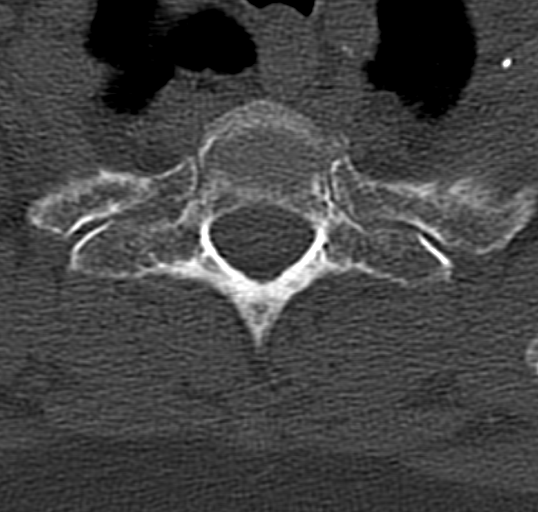
[im 47/93  soft-tissue]
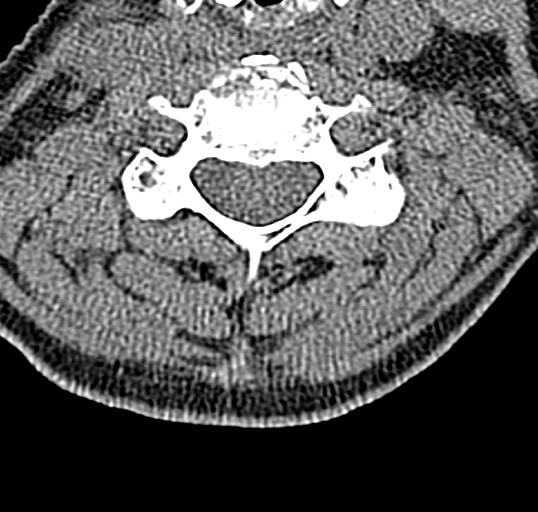
[im 70/93  soft-tissue]
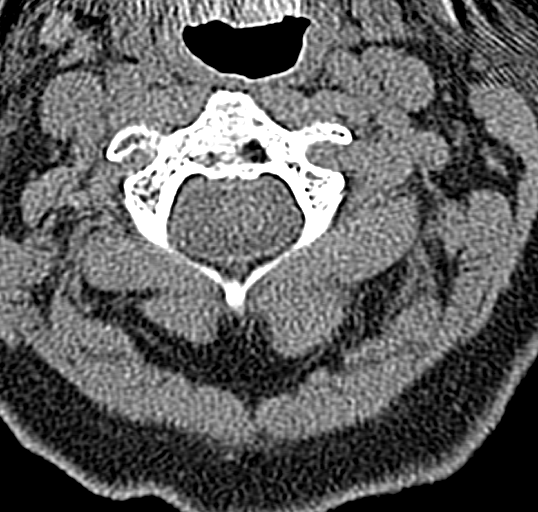

[5 of 33 positions shown; findings below may reference images not displayed]

FINDINGS: CT HEAD FINDINGS

Brain: No evidence of acute infarction, hemorrhage, hydrocephalus,
extra-axial collection or mass lesion/mass effect.

Prominence of the ventricles and sulci reflects mild to moderate
cortical volume loss. Cerebellar atrophy is noted. Scattered
periventricular and subcortical white matter change likely reflects
small vessel ischemic microangiopathy.

The brainstem and fourth ventricle are within normal limits. The
basal ganglia are unremarkable in appearance. The cerebral
hemispheres demonstrate grossly normal gray-white differentiation.
No mass effect or midline shift is seen.

Vascular: No hyperdense vessel or unexpected calcification.

Skull: There is no evidence of fracture; visualized osseous
structures are unremarkable in appearance.

Sinuses/Orbits: The orbits are within normal limits. The paranasal
sinuses and mastoid air cells are well-aerated.

Other: No significant soft tissue abnormalities are seen.

CT CERVICAL SPINE FINDINGS

Alignment: Normal.

Skull base and vertebrae: No acute fracture. No primary bone lesion
or focal pathologic process.

Soft tissues and spinal canal: No prevertebral fluid or swelling. No
visible canal hematoma.

Disc levels: Intervertebral disc spaces are preserved. Small
posterior disc osteophyte complexes are noted along the cervical
spine. Minimal calcification is seen along multiple mid cervical
discs. Mild underlying facet disease is noted.

Upper chest: Mild calcification is noted at the carotid bifurcations
bilaterally. The thyroid gland is unremarkable. The visualized lung
apices are clear.

Other: No additional soft tissue abnormalities are seen.
IMPRESSION: 1. No evidence of traumatic intracranial injury or fracture.
2. No evidence fracture or subluxation along the cervical spine.
3. Mild to moderate cortical volume loss and scattered small vessel
ischemic microangiopathy.
4. Minimal degenerative change along the cervical spine.
5. Mild calcification at the carotid bifurcations bilaterally.
Carotid ultrasound could be considered for further evaluation, when
and as deemed clinically appropriate.

## 2017-07-05 IMAGING — CR DG KNEE COMPLETE 4+V*R*
4 series · 4 of 4 positions shown · non-contrast
Comparison: 11/11/2015

CLINICAL DATA: Fell last night.  Dementia.

EXAM:
RIGHT KNEE - COMPLETE 4+ VIEW

[t knee obl right (1 of 2)]
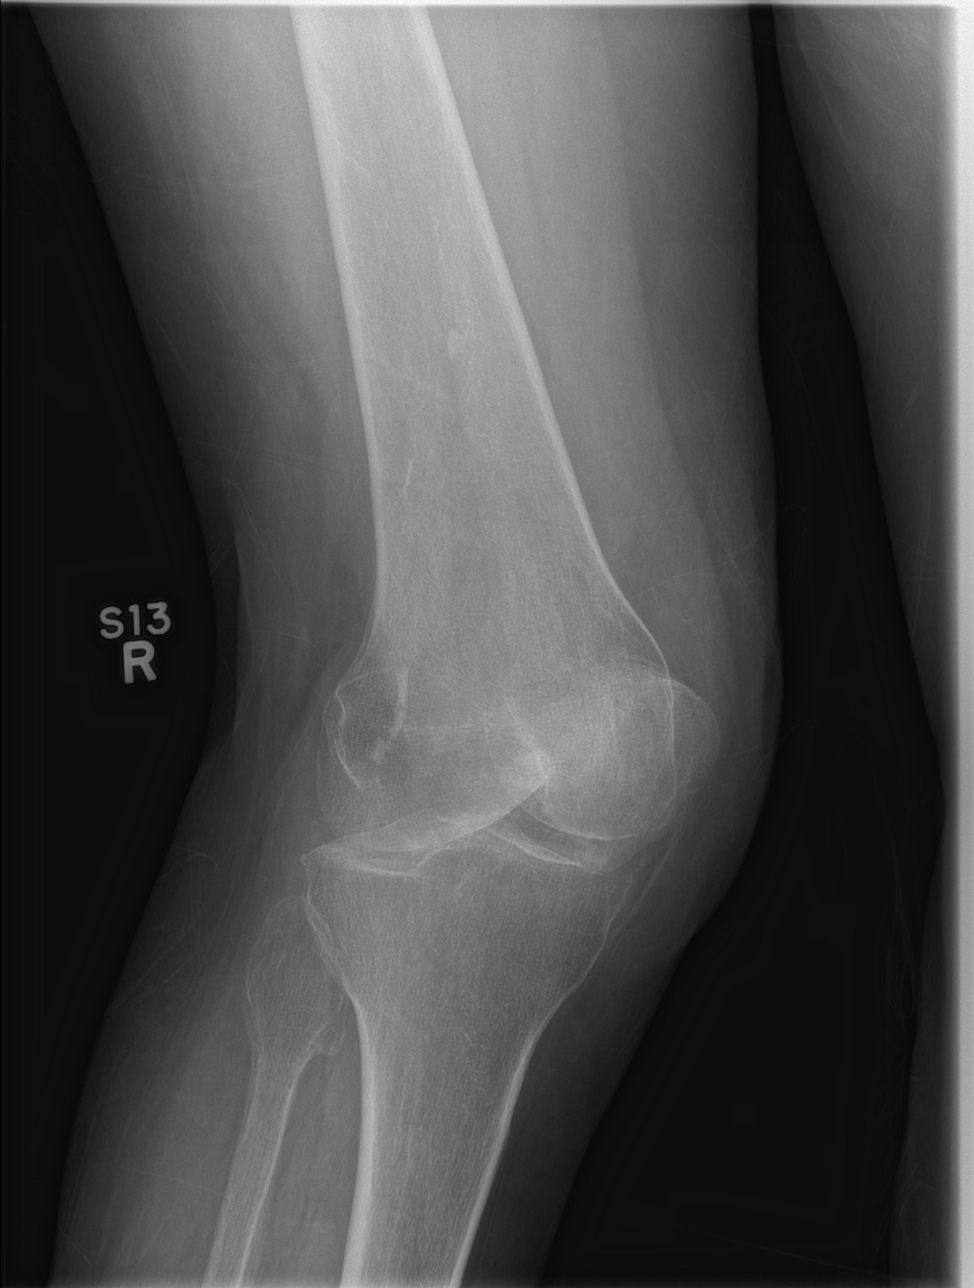

[t knee ap right]
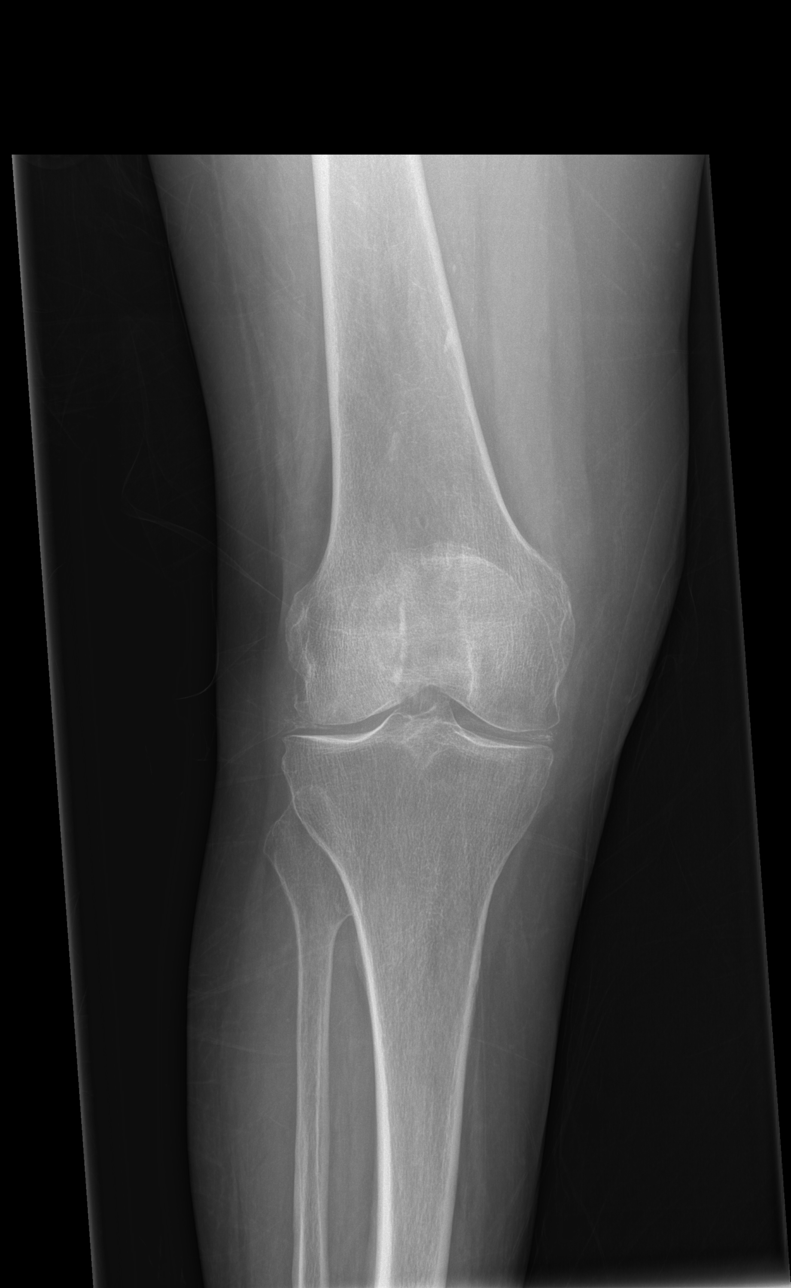

[t knee obl right (2 of 2)]
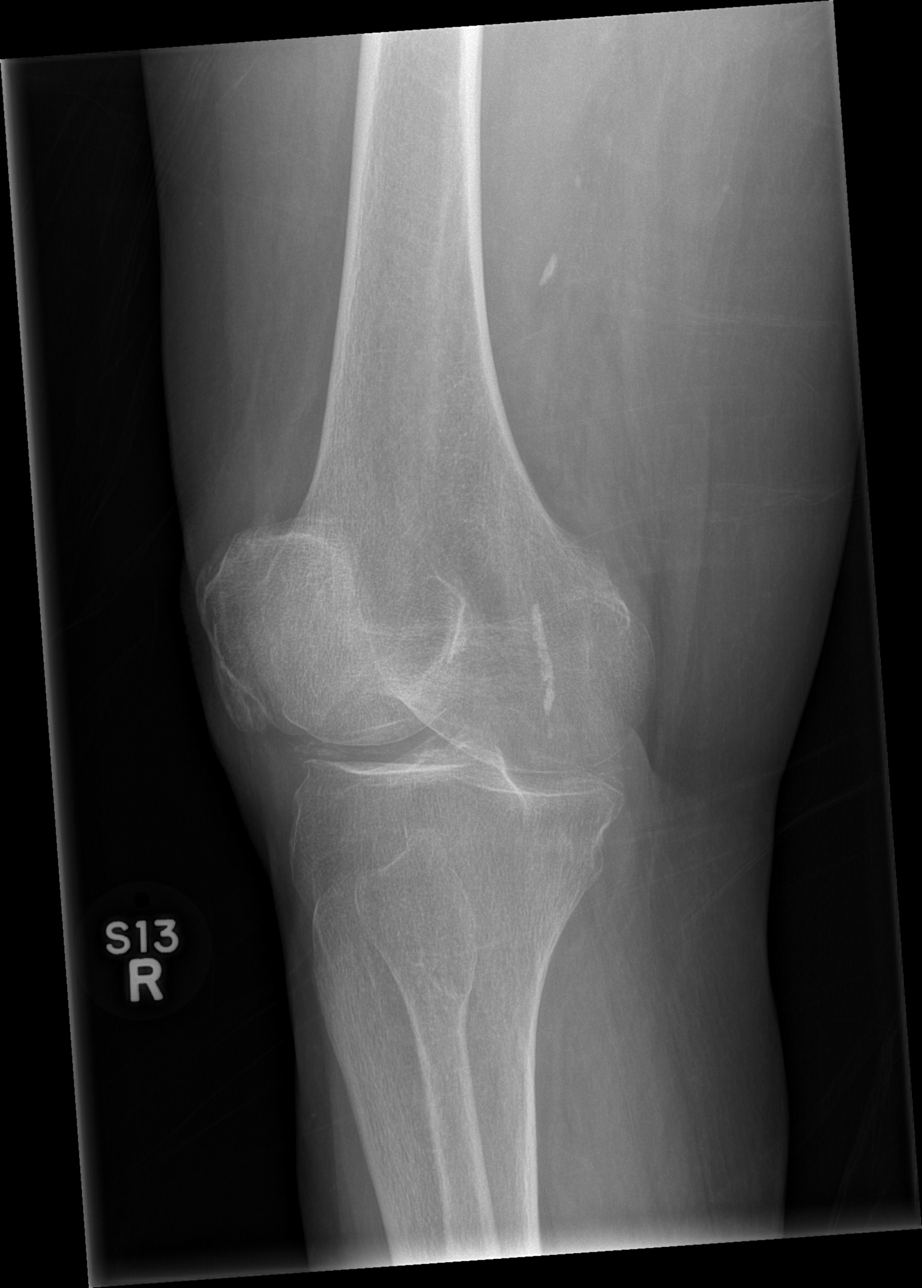

[t knee lat right]
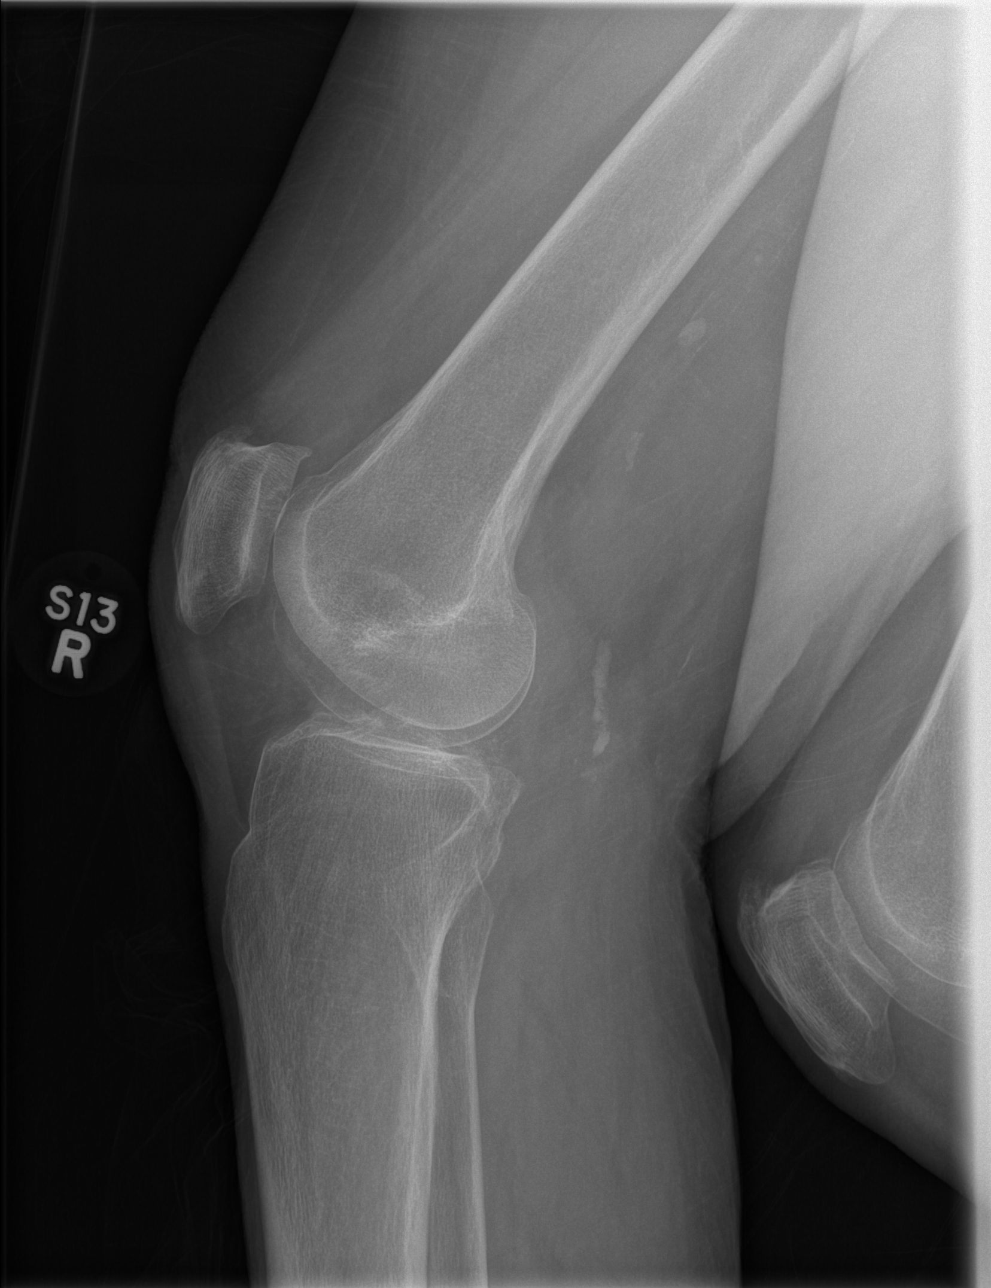

[4 of 4 positions shown; findings below may reference images not displayed]

FINDINGS: Small joint effusion. No visible fracture. Degenerative
chondrocalcinosis of age. Regional vascular calcification.
IMPRESSION: No acute or traumatic finding. Small joint effusion. Degenerative
chondrocalcinosis.

## 2017-07-06 DIAGNOSIS — G0430 Acute necrotizing hemorrhagic encephalopathy, unspecified: Secondary | ICD-10-CM | POA: Diagnosis not present

## 2017-07-07 DIAGNOSIS — G0430 Acute necrotizing hemorrhagic encephalopathy, unspecified: Secondary | ICD-10-CM | POA: Diagnosis not present

## 2017-07-08 DIAGNOSIS — G0430 Acute necrotizing hemorrhagic encephalopathy, unspecified: Secondary | ICD-10-CM | POA: Diagnosis not present

## 2017-07-09 DIAGNOSIS — G0430 Acute necrotizing hemorrhagic encephalopathy, unspecified: Secondary | ICD-10-CM | POA: Diagnosis not present

## 2017-07-10 DIAGNOSIS — G0430 Acute necrotizing hemorrhagic encephalopathy, unspecified: Secondary | ICD-10-CM | POA: Diagnosis not present

## 2017-07-11 DIAGNOSIS — G0430 Acute necrotizing hemorrhagic encephalopathy, unspecified: Secondary | ICD-10-CM | POA: Diagnosis not present

## 2017-07-12 DIAGNOSIS — G0430 Acute necrotizing hemorrhagic encephalopathy, unspecified: Secondary | ICD-10-CM | POA: Diagnosis not present

## 2017-07-13 DIAGNOSIS — G0430 Acute necrotizing hemorrhagic encephalopathy, unspecified: Secondary | ICD-10-CM | POA: Diagnosis not present

## 2017-07-13 IMAGING — CT CT HEAD W/O CM
1 series · 1 of 1 positions shown · non-contrast
Comparison: 04/17/2016

CLINICAL DATA: Unwitnessed fall. Altered mental status. History of
seizures. Initial encounter.

EXAM:
CT HEAD WITHOUT CONTRAST
CT CERVICAL SPINE WITHOUT CONTRAST
TECHNIQUE: Multidetector CT imaging of the head and cervical spine was
performed following the standard protocol without intravenous
contrast. Multiplanar CT image reconstructions of the cervical spine
were also generated.

[Series 100: scout · sagittal · 0.6mm · 0.68mm/px · 1 of 1 slices shown]
[im 1/1]
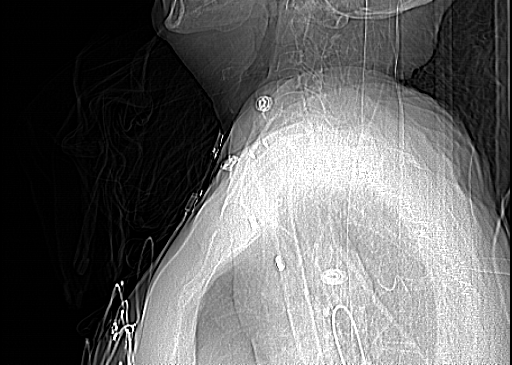

[1 of 1 positions shown; findings below may reference images not displayed]

FINDINGS: CT HEAD FINDINGS

Brain: There is no evidence of acute cortical infarct, intracranial
hemorrhage, mass, midline shift, or extra-axial fluid collection.
Cerebral atrophy and mild to moderate chronic small vessel ischemic
change in the cerebral white matter are stable.

Vascular: Calcified atherosclerosis at the skullbase.

Skull: No fracture or focal osseous lesion.

Sinuses/Orbits: Mild mucosal thickening and small volume secretions
in the left sphenoid sinus. Clear mastoid air cells. Prior bilateral
cataract extraction.

Other: None.

CT CERVICAL SPINE FINDINGS

Alignment: No evidence of acute traumatic subluxation. Trace
retrolisthesis of C5 on C6 is likely degenerative.

Skull base and vertebrae: No acute fracture or aggressive osseous
process is identified. A few small scattered lucencies in the
cervical vertebral bodies are similar in appearance to a 12/15/2006
study and likely benign.

Soft tissues and spinal canal: No prevertebral fluid or swelling. No
visible canal hematoma.

Disc levels: Stable appearance of mild multilevel disc degeneration.

Upper chest: Unremarkable.

Other: Bilateral carotid bifurcation atherosclerosis.
IMPRESSION: 1. No evidence of acute intracranial abnormality.
2. Moderate chronic small vessel ischemic disease.
3. No evidence of acute cervical spine fracture.

## 2017-07-14 DIAGNOSIS — G0430 Acute necrotizing hemorrhagic encephalopathy, unspecified: Secondary | ICD-10-CM | POA: Diagnosis not present

## 2017-07-15 DIAGNOSIS — G0430 Acute necrotizing hemorrhagic encephalopathy, unspecified: Secondary | ICD-10-CM | POA: Diagnosis not present

## 2017-07-16 DIAGNOSIS — G0430 Acute necrotizing hemorrhagic encephalopathy, unspecified: Secondary | ICD-10-CM | POA: Diagnosis not present

## 2017-07-17 DIAGNOSIS — G0430 Acute necrotizing hemorrhagic encephalopathy, unspecified: Secondary | ICD-10-CM | POA: Diagnosis not present

## 2017-07-18 DIAGNOSIS — G0430 Acute necrotizing hemorrhagic encephalopathy, unspecified: Secondary | ICD-10-CM | POA: Diagnosis not present

## 2017-07-19 DIAGNOSIS — G0430 Acute necrotizing hemorrhagic encephalopathy, unspecified: Secondary | ICD-10-CM | POA: Diagnosis not present

## 2017-07-20 DIAGNOSIS — G309 Alzheimer's disease, unspecified: Secondary | ICD-10-CM | POA: Diagnosis not present

## 2017-07-20 DIAGNOSIS — G0430 Acute necrotizing hemorrhagic encephalopathy, unspecified: Secondary | ICD-10-CM | POA: Diagnosis not present

## 2017-07-20 DIAGNOSIS — F330 Major depressive disorder, recurrent, mild: Secondary | ICD-10-CM | POA: Diagnosis not present

## 2017-07-20 DIAGNOSIS — F411 Generalized anxiety disorder: Secondary | ICD-10-CM | POA: Diagnosis not present

## 2017-07-20 DIAGNOSIS — F0281 Dementia in other diseases classified elsewhere with behavioral disturbance: Secondary | ICD-10-CM | POA: Diagnosis not present

## 2017-07-21 DIAGNOSIS — G0430 Acute necrotizing hemorrhagic encephalopathy, unspecified: Secondary | ICD-10-CM | POA: Diagnosis not present

## 2017-07-22 DIAGNOSIS — G0430 Acute necrotizing hemorrhagic encephalopathy, unspecified: Secondary | ICD-10-CM | POA: Diagnosis not present

## 2017-07-23 DIAGNOSIS — G0430 Acute necrotizing hemorrhagic encephalopathy, unspecified: Secondary | ICD-10-CM | POA: Diagnosis not present

## 2017-07-24 DIAGNOSIS — G0430 Acute necrotizing hemorrhagic encephalopathy, unspecified: Secondary | ICD-10-CM | POA: Diagnosis not present

## 2017-07-25 DIAGNOSIS — G0430 Acute necrotizing hemorrhagic encephalopathy, unspecified: Secondary | ICD-10-CM | POA: Diagnosis not present

## 2017-07-26 DIAGNOSIS — G0430 Acute necrotizing hemorrhagic encephalopathy, unspecified: Secondary | ICD-10-CM | POA: Diagnosis not present

## 2017-07-27 DIAGNOSIS — G0430 Acute necrotizing hemorrhagic encephalopathy, unspecified: Secondary | ICD-10-CM | POA: Diagnosis not present

## 2017-07-28 DIAGNOSIS — G0430 Acute necrotizing hemorrhagic encephalopathy, unspecified: Secondary | ICD-10-CM | POA: Diagnosis not present

## 2017-07-29 DIAGNOSIS — Z961 Presence of intraocular lens: Secondary | ICD-10-CM | POA: Diagnosis not present

## 2017-07-29 DIAGNOSIS — G0430 Acute necrotizing hemorrhagic encephalopathy, unspecified: Secondary | ICD-10-CM | POA: Diagnosis not present

## 2017-07-30 DIAGNOSIS — G0430 Acute necrotizing hemorrhagic encephalopathy, unspecified: Secondary | ICD-10-CM | POA: Diagnosis not present

## 2017-07-31 DIAGNOSIS — G0430 Acute necrotizing hemorrhagic encephalopathy, unspecified: Secondary | ICD-10-CM | POA: Diagnosis not present

## 2017-08-01 DIAGNOSIS — G0430 Acute necrotizing hemorrhagic encephalopathy, unspecified: Secondary | ICD-10-CM | POA: Diagnosis not present

## 2017-08-02 DIAGNOSIS — G0430 Acute necrotizing hemorrhagic encephalopathy, unspecified: Secondary | ICD-10-CM | POA: Diagnosis not present

## 2017-08-03 DIAGNOSIS — G0430 Acute necrotizing hemorrhagic encephalopathy, unspecified: Secondary | ICD-10-CM | POA: Diagnosis not present

## 2017-08-04 DIAGNOSIS — G0430 Acute necrotizing hemorrhagic encephalopathy, unspecified: Secondary | ICD-10-CM | POA: Diagnosis not present

## 2017-08-04 IMAGING — CR DG FEMUR 2+V*L*
4 series · 4 of 4 positions shown · non-contrast
Comparison: CT abdomen and pelvis April 17, 2016

CLINICAL DATA: Unwitnessed fall.  LEFT leg pain.

EXAM:
LEFT FEMUR 2 VIEWS; PELVIS - 1-2 VIEW

[x femur proximal ap left]
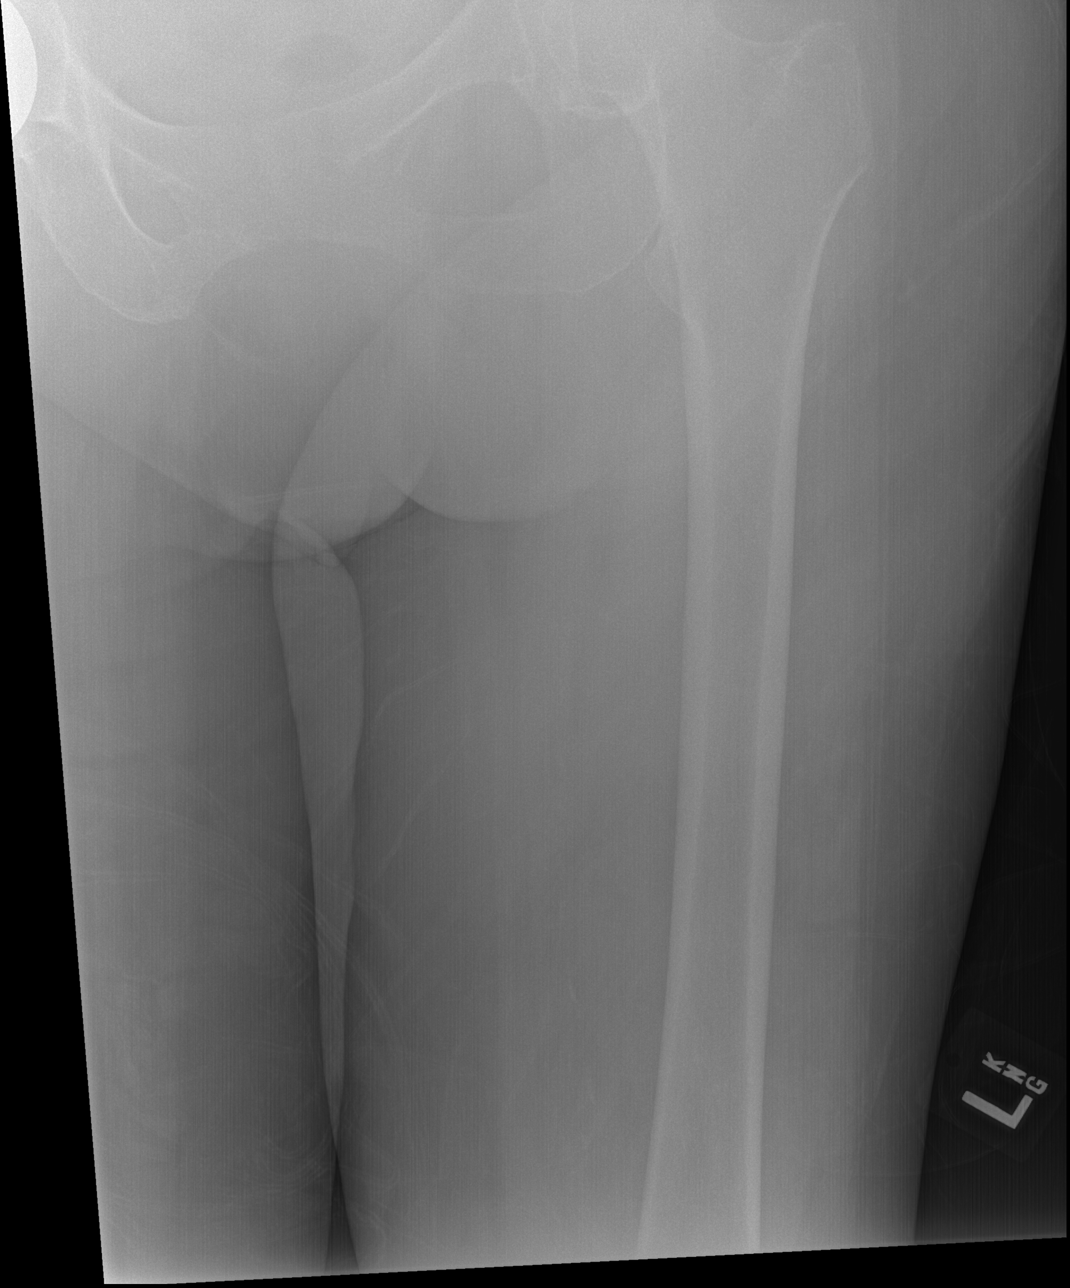

[x femur distal ap left]
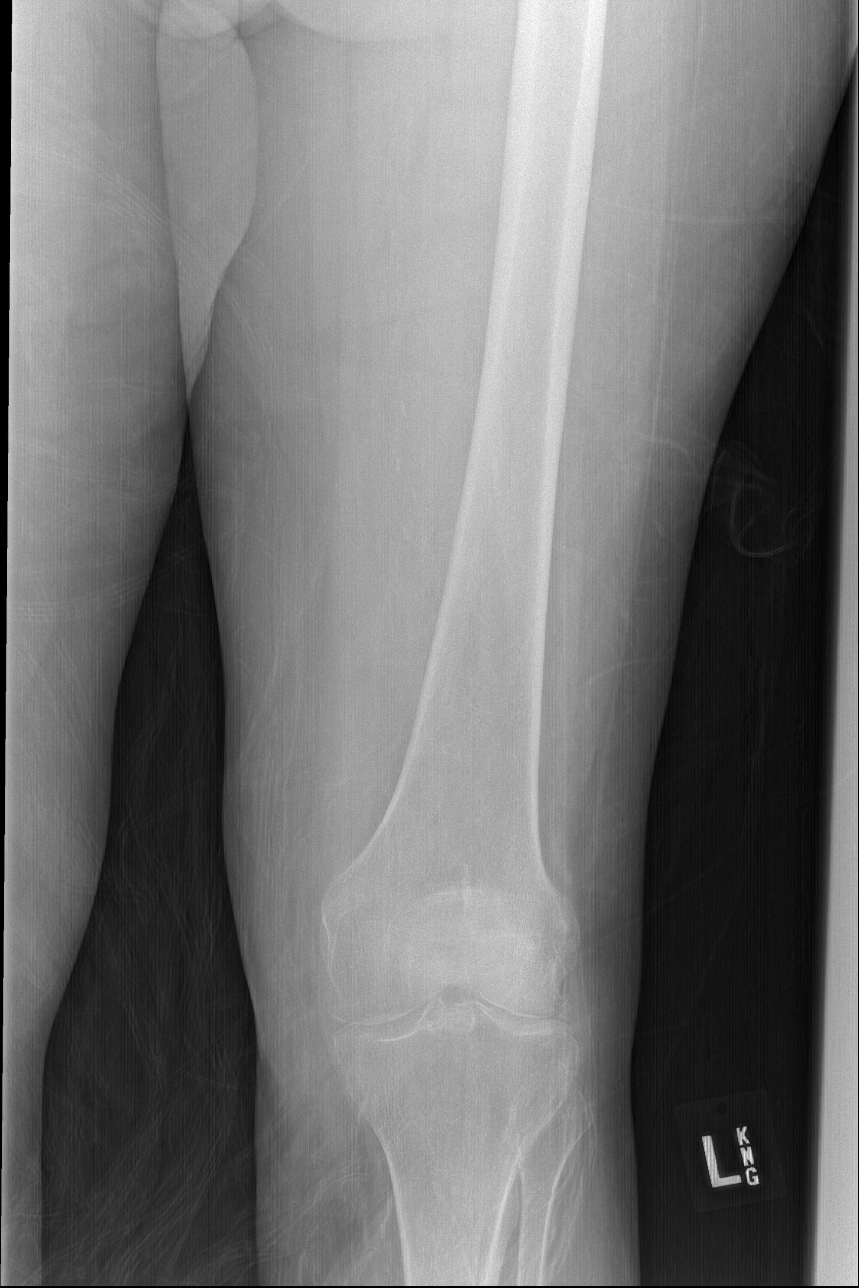

[x femur distal lat left]
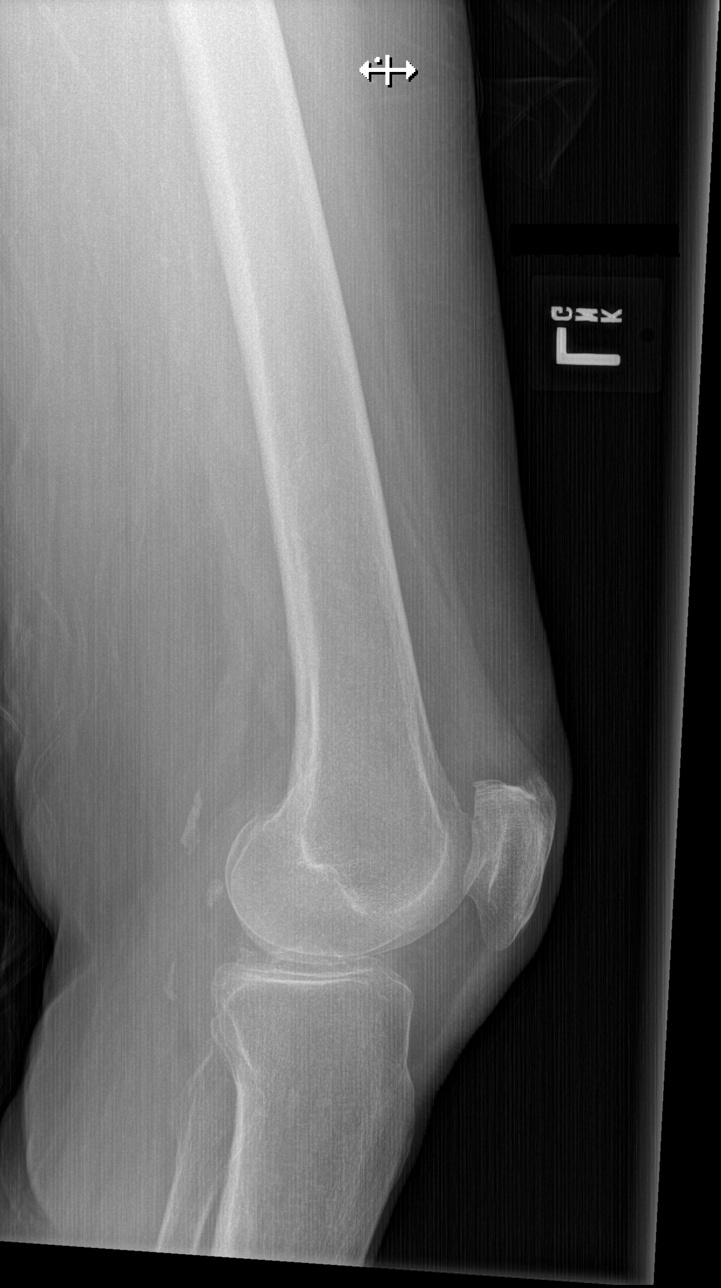

[w femur distal lat left]
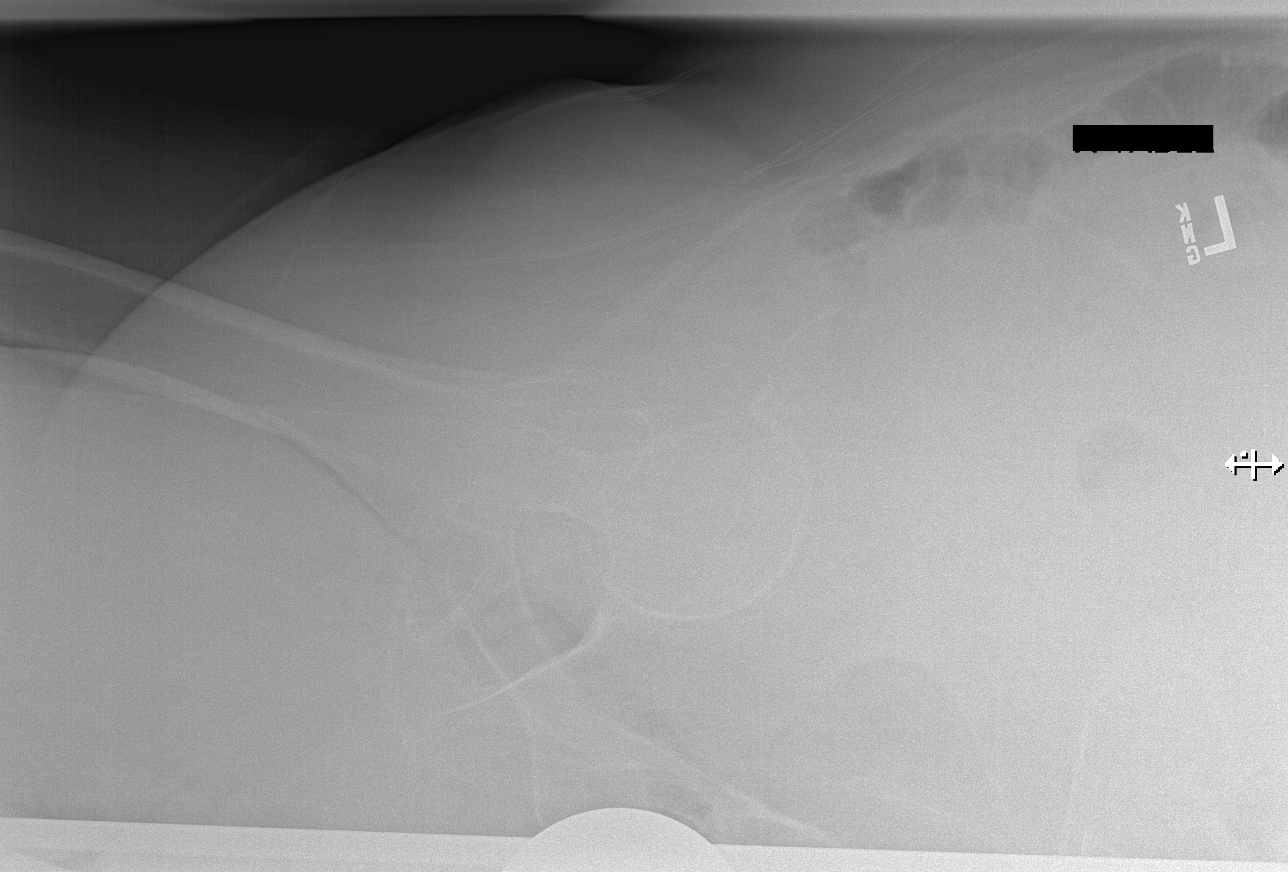

[4 of 4 positions shown; findings below may reference images not displayed]

FINDINGS: Status post RIGHT hip hemiarthroplasty. Hip joint spaces are intact.
Sacroiliac joints are symmetric. Mild degenerative change of the
knee and LEFT hip. Osteopenia. Mild vascular calcifications.

No destructive bony lesions. Included soft tissue planes are
non-suspicious.
IMPRESSION: No acute fracture deformity or dislocation.

If there is high clinical suspicion for occult hip fracture or if
the patient refuses to bear-weight, consider further evaluation with
MRI. Although CT is expeditious, evidence is lacking regarding
accuracy of CT over plain film radiography.

## 2017-08-04 IMAGING — CR DG PELVIS 1-2V
2 series · 2 of 2 positions shown · non-contrast
Comparison: CT abdomen and pelvis April 17, 2016

CLINICAL DATA: Unwitnessed fall.  LEFT leg pain.

EXAM:
LEFT FEMUR 2 VIEWS; PELVIS - 1-2 VIEW

[x pelvis (1 of 2)]
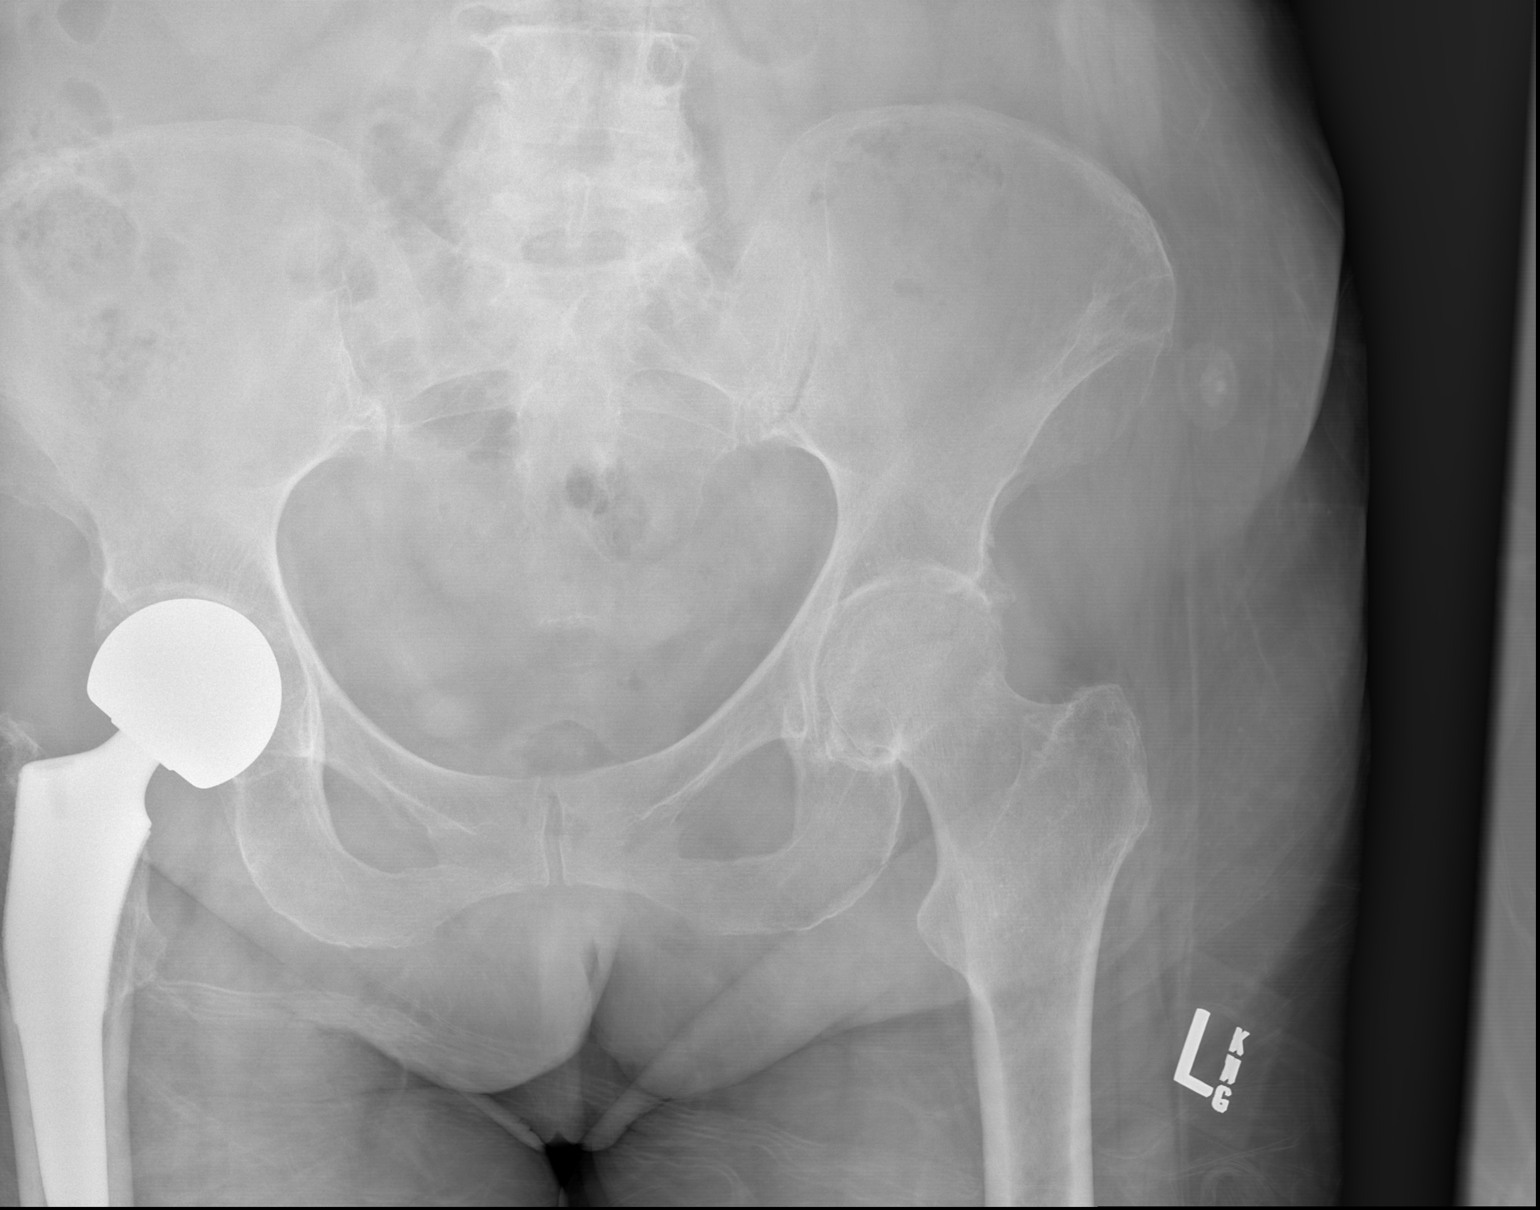

[x pelvis (2 of 2)]
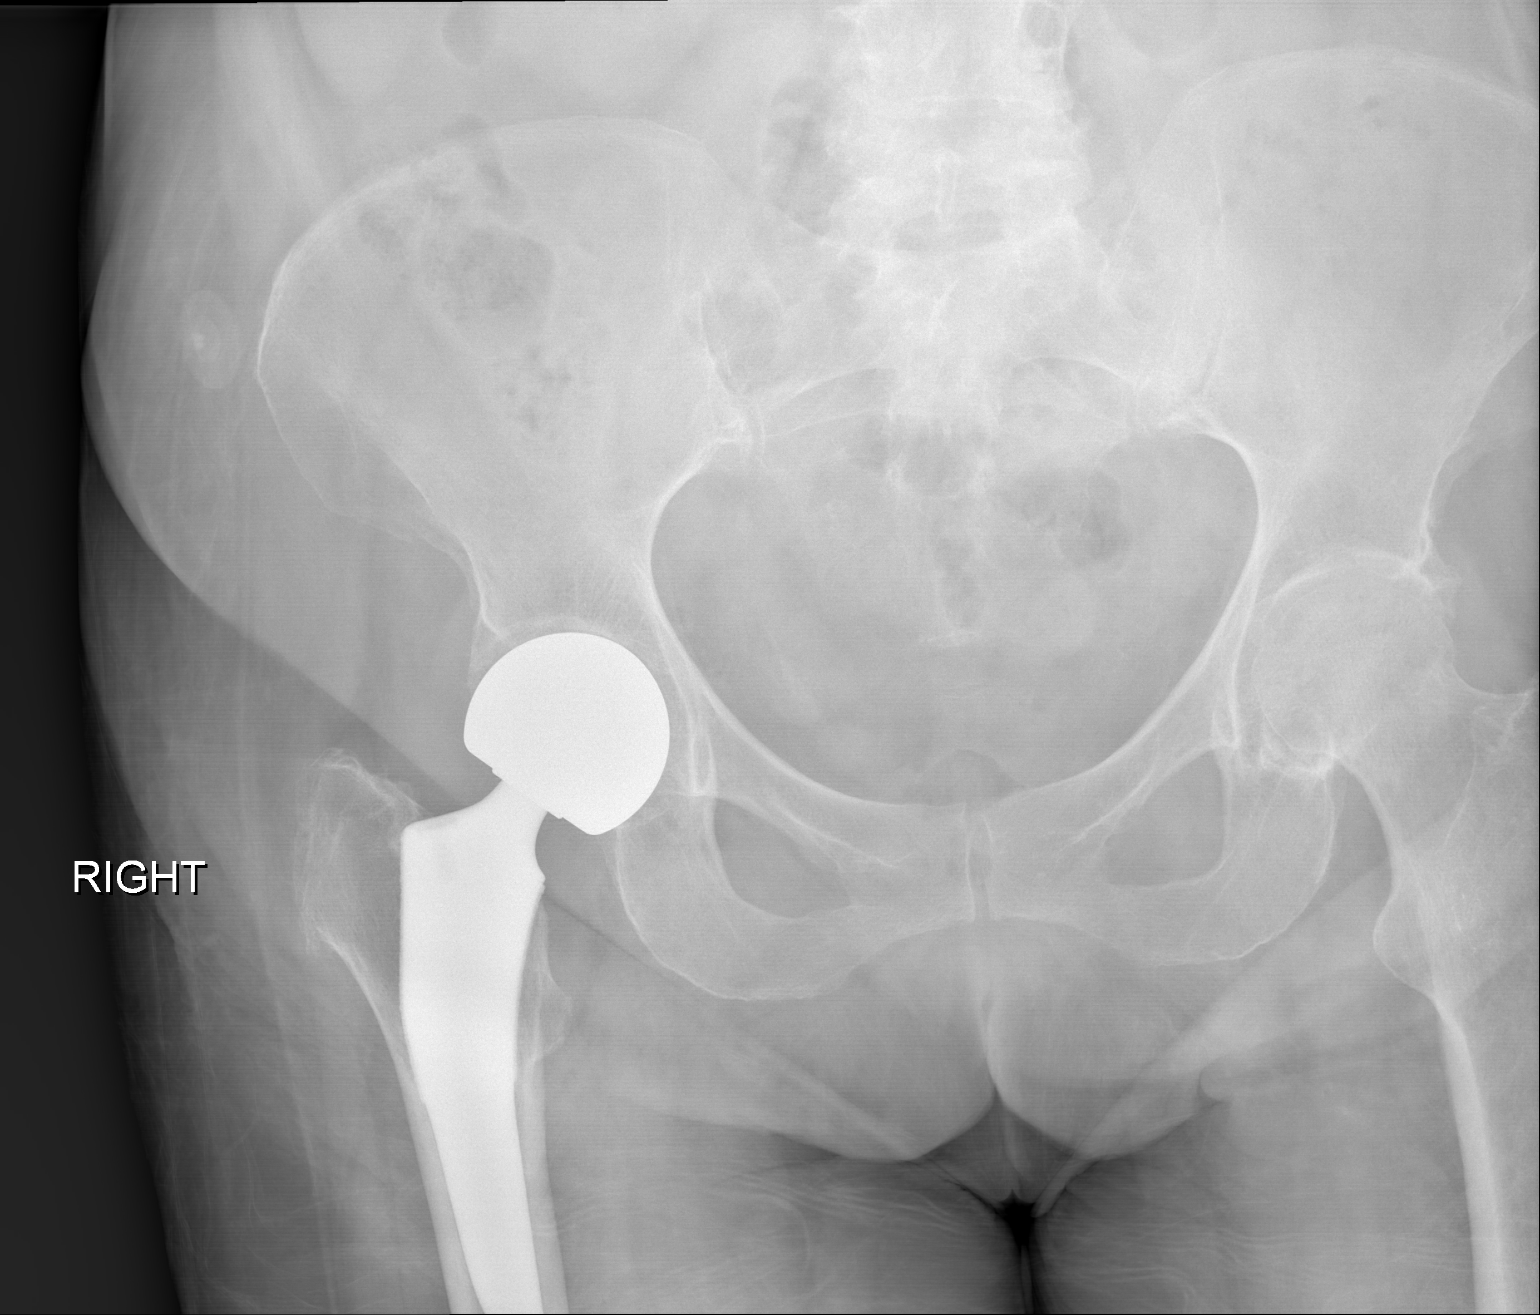

[2 of 2 positions shown; findings below may reference images not displayed]

FINDINGS: Status post RIGHT hip hemiarthroplasty. Hip joint spaces are intact.
Sacroiliac joints are symmetric. Mild degenerative change of the
knee and LEFT hip. Osteopenia. Mild vascular calcifications.

No destructive bony lesions. Included soft tissue planes are
non-suspicious.
IMPRESSION: No acute fracture deformity or dislocation.

If there is high clinical suspicion for occult hip fracture or if
the patient refuses to bear-weight, consider further evaluation with
MRI. Although CT is expeditious, evidence is lacking regarding
accuracy of CT over plain film radiography.

## 2017-08-05 DIAGNOSIS — G0430 Acute necrotizing hemorrhagic encephalopathy, unspecified: Secondary | ICD-10-CM | POA: Diagnosis not present

## 2017-08-06 DIAGNOSIS — G0430 Acute necrotizing hemorrhagic encephalopathy, unspecified: Secondary | ICD-10-CM | POA: Diagnosis not present

## 2017-08-07 DIAGNOSIS — G0430 Acute necrotizing hemorrhagic encephalopathy, unspecified: Secondary | ICD-10-CM | POA: Diagnosis not present

## 2017-08-08 DIAGNOSIS — G0430 Acute necrotizing hemorrhagic encephalopathy, unspecified: Secondary | ICD-10-CM | POA: Diagnosis not present

## 2017-08-09 DIAGNOSIS — G0430 Acute necrotizing hemorrhagic encephalopathy, unspecified: Secondary | ICD-10-CM | POA: Diagnosis not present

## 2017-08-09 IMAGING — CR DG PELVIS 1-2V
2 series · 2 of 2 positions shown · non-contrast
Comparison: Prior radiograph from 05/17/2016.

CLINICAL DATA: Initial evaluation for acute trauma, fall.

EXAM:
PELVIS - 1-2 VIEW

[x pelvis (1 of 2)]
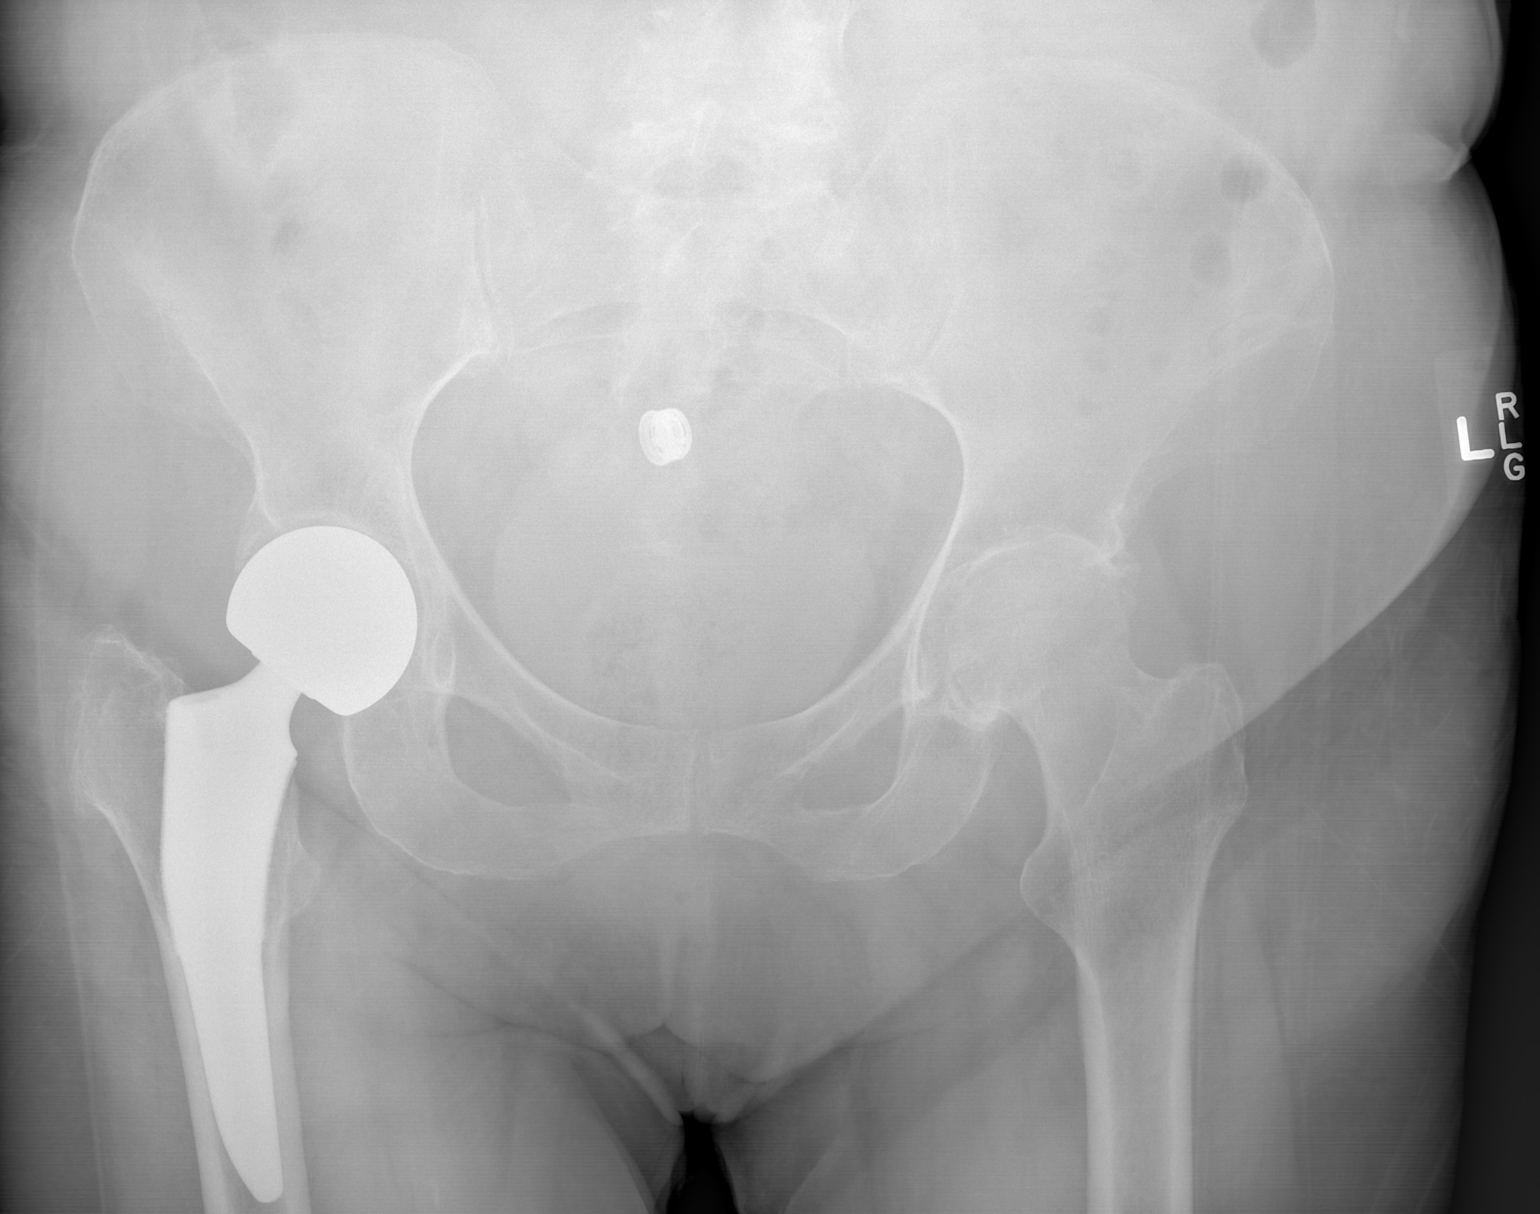

[x pelvis (2 of 2)]
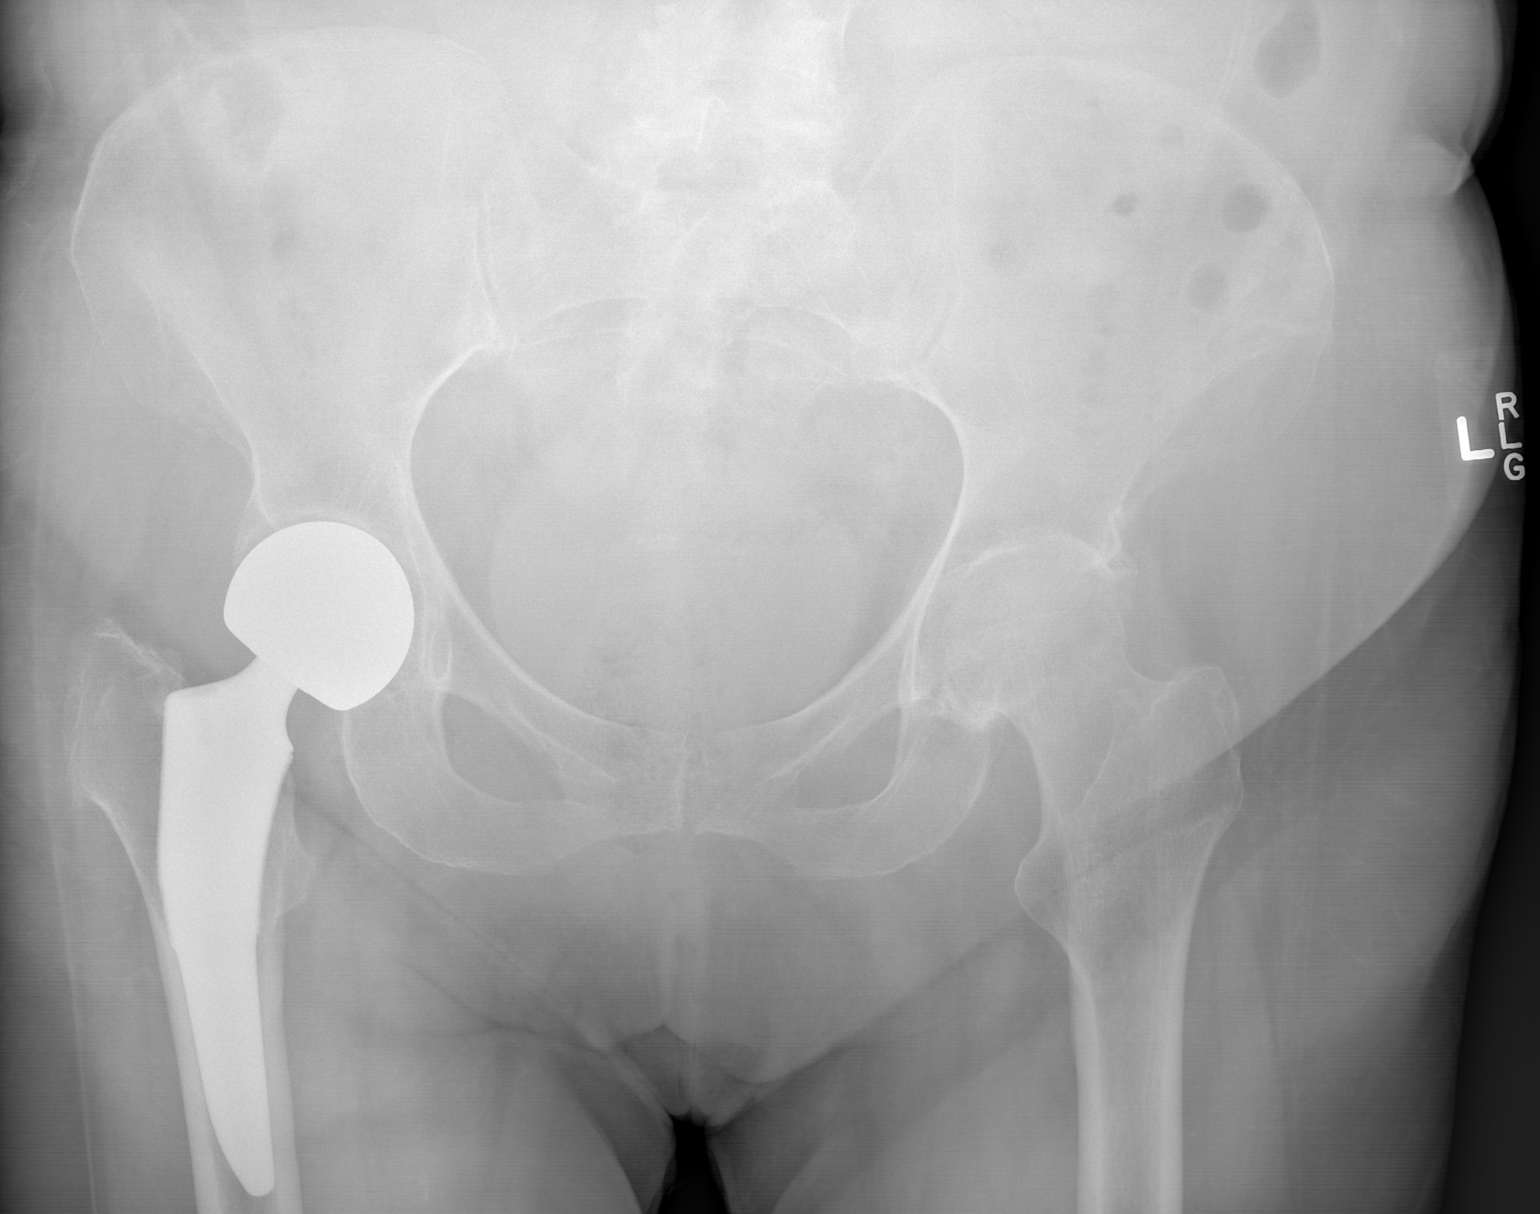

[2 of 2 positions shown; findings below may reference images not displayed]

FINDINGS: Right hip arthroplasty in place. Severe degenerative osteoarthritic
changes present at the left hip. No acute fracture identified.
Degenerative changes noted within the lower lumbar spine.

No acute soft tissue abnormality.
IMPRESSION: 1. No acute fracture or dislocation within the pelvis.
2. Right hip arthroplasty in place without complication.
3. Advanced degenerative osteoporosis at the left hip.

## 2017-08-09 IMAGING — CT CT CERVICAL SPINE W/O CM
4 of 8 series · 12 of 33 positions shown, 13 images · non-contrast
Comparison: Prior CT from 04/25/2016.

CLINICAL DATA: Initial evaluation for acute trauma, fall.

EXAM:
CT HEAD WITHOUT CONTRAST
CT CERVICAL SPINE WITHOUT CONTRAST
TECHNIQUE: Multidetector CT imaging of the head and cervical spine was
performed following the standard protocol without intravenous
contrast. Multiplanar CT image reconstructions of the cervical spine
were also generated.

[Series 5: coronal · coronal · 0.29mm/px · 1 of 59 slices shown]
[im 30/59  bone]
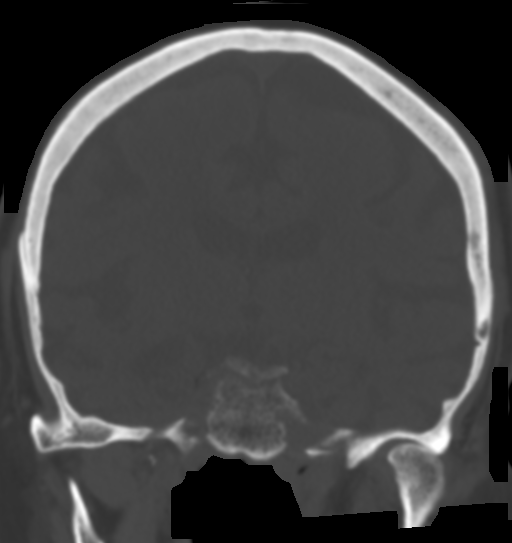

[Series 7: c-spine st · axial · 0.29mm/px · z∈[-342,-246]mm · 3 of 97 slices shown, 4 images]
[im 25/97  soft-tissue]
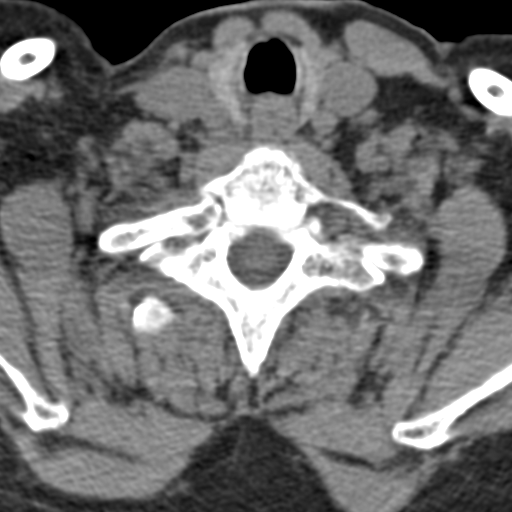
[im 25/97  bone]
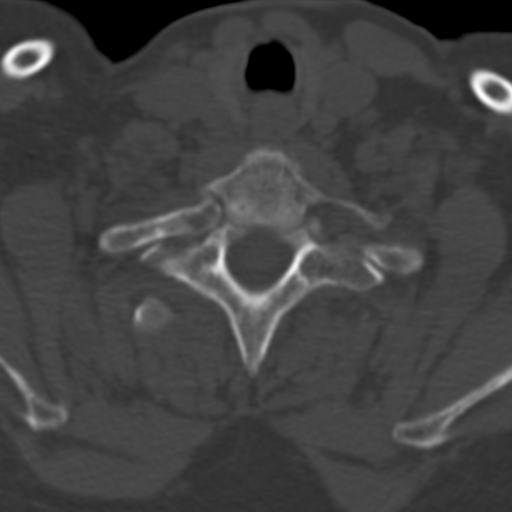
[im 49/97  bone]
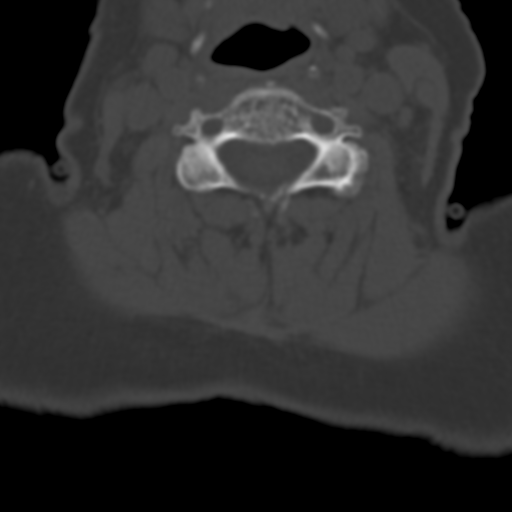
[im 73/97  bone]
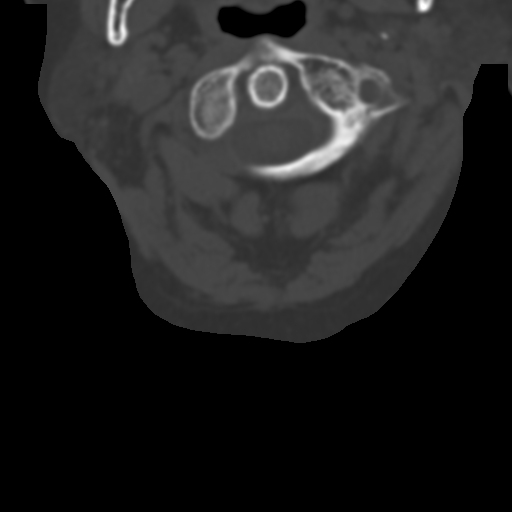

[Series 9: axial recon · axial · 0.20mm/px · z∈[-359,-269]mm · 3 of 99 slices shown]
[im 25/99  bone]
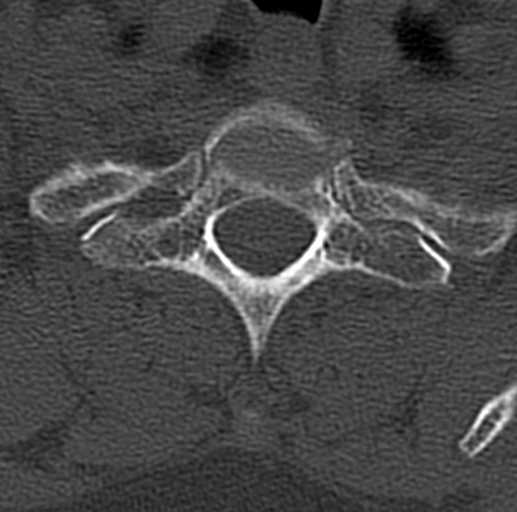
[im 50/99  bone]
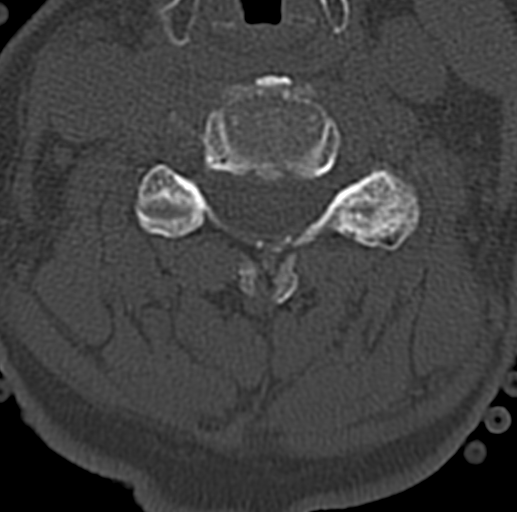
[im 74/99  bone]
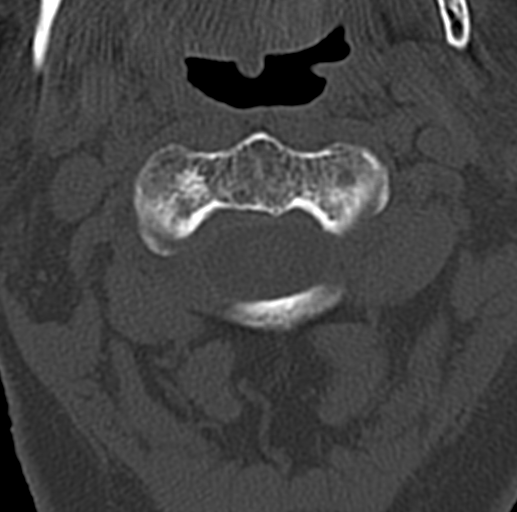

[Series 11: sagittal · sagittal · 0.21mm/px · 5 of 50 slices shown]
[im 9/50  bone]
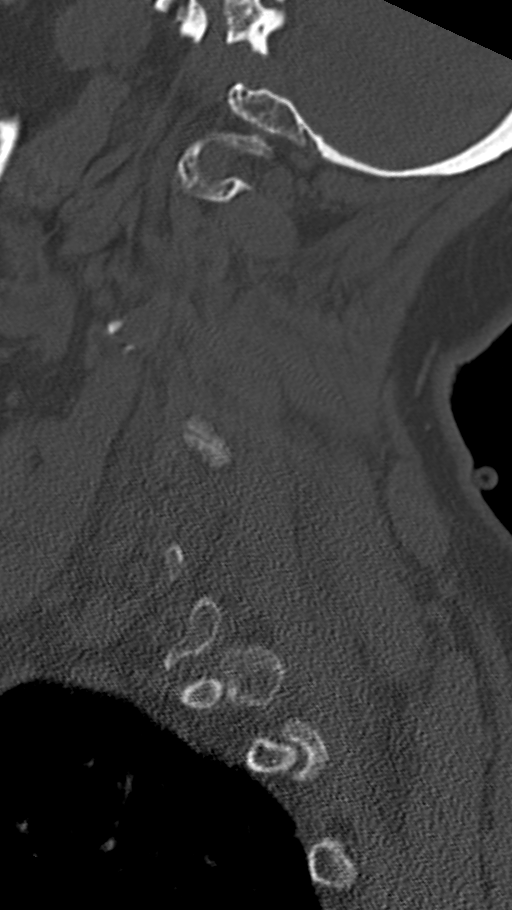
[im 17/50  bone]
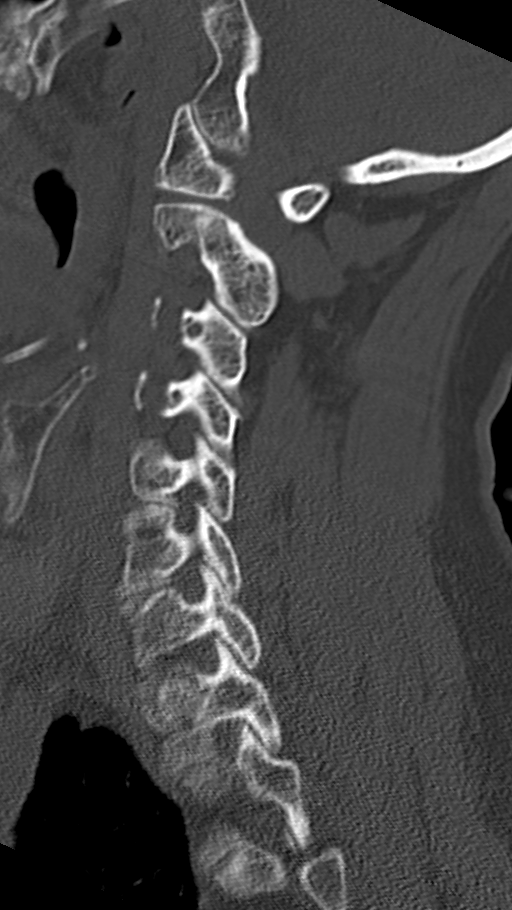
[im 25/50  bone]
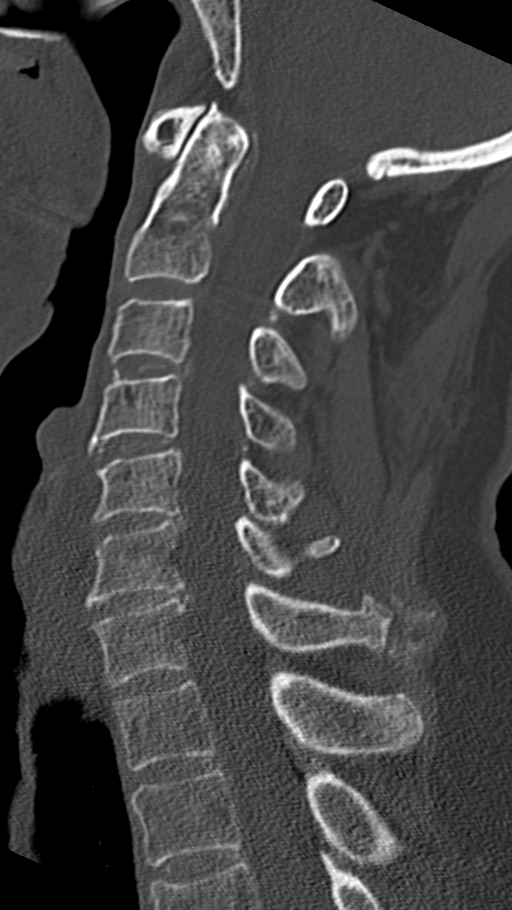
[im 33/50  bone]
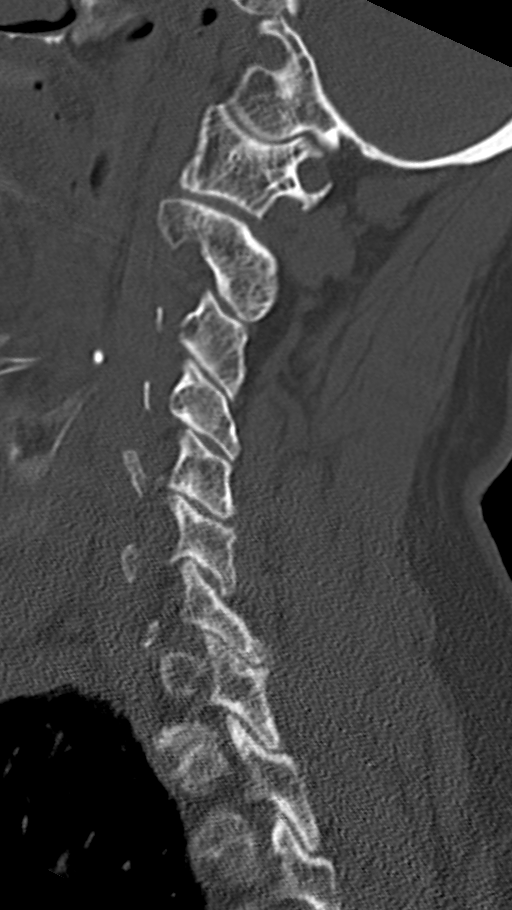
[im 41/50  bone]
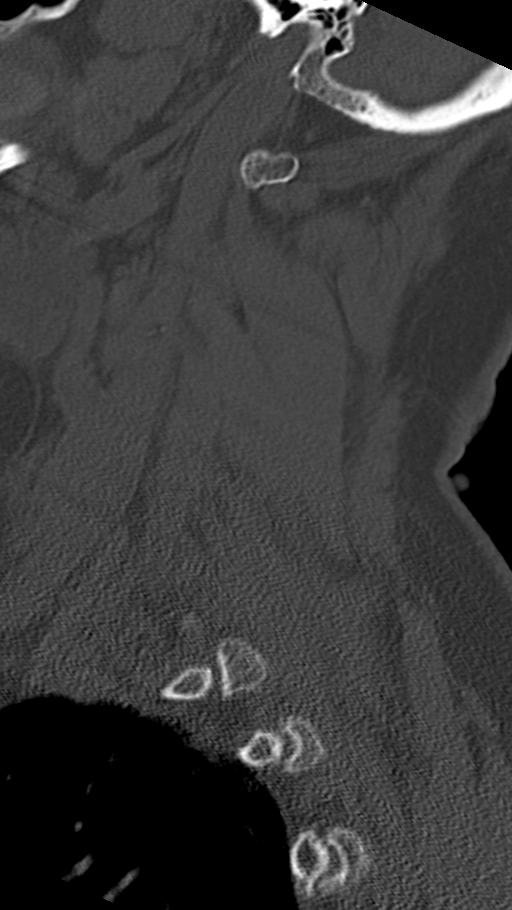

[12 of 33 positions shown; findings below may reference images not displayed]

FINDINGS: CT HEAD FINDINGS

Brain: Generalized cerebral atrophy with chronic microvascular
ischemic disease. No acute intracranial hemorrhage. No evidence for
acute large vessel territory infarct. No mass lesion, midline shift,
or mass effect. No hydrocephalus. No extra-axial fluid collection.

Vascular: No hyperdense vessel.  Intracranial atherosclerosis noted.

Skull: Scalp soft tissues demonstrate no acute abnormality.
Calvarium intact.

Sinuses/Orbits: Globes and orbital soft tissues within normal
limits. Paranasal sinuses and mastoids are clear.

CT CERVICAL SPINE FINDINGS

Alignment: Vertebral bodies normally aligned with preservation of
the normal cervical lordosis. No listhesis.

Skull base and vertebrae: Skullbase intact. Normal C1-2
articulations are preserved. Dens is intact. Vertebral body heights
maintained. No acute fracture.

Soft tissues and spinal canal: Visualized soft tissues of the neck
demonstrate no acute abnormality. Scattered calcification adjacent
to the C7 spinous process is stable. No prevertebral edema. Vascular
calcifications noted about the carotid bifurcations.

Disc levels: Mild multilevel degenerative changes, most prevalent at
C5-6 and C6-7, stable. Degenerative disc bulge noted at C3-4.

Upper chest: Visualized upper chest within normal limits. No apical
pneumothorax.
IMPRESSION: 1. No acute intracranial process identified.
2. Generalized cerebral atrophy with chronic microvascular ischemic
disease, stable.
3. No acute traumatic injury within the cervical spine.

## 2017-08-10 DIAGNOSIS — G0430 Acute necrotizing hemorrhagic encephalopathy, unspecified: Secondary | ICD-10-CM | POA: Diagnosis not present

## 2017-08-11 DIAGNOSIS — G0430 Acute necrotizing hemorrhagic encephalopathy, unspecified: Secondary | ICD-10-CM | POA: Diagnosis not present

## 2017-08-12 DIAGNOSIS — G0430 Acute necrotizing hemorrhagic encephalopathy, unspecified: Secondary | ICD-10-CM | POA: Diagnosis not present

## 2017-08-13 DIAGNOSIS — G0430 Acute necrotizing hemorrhagic encephalopathy, unspecified: Secondary | ICD-10-CM | POA: Diagnosis not present

## 2017-08-14 DIAGNOSIS — G0430 Acute necrotizing hemorrhagic encephalopathy, unspecified: Secondary | ICD-10-CM | POA: Diagnosis not present

## 2017-08-15 DIAGNOSIS — G0430 Acute necrotizing hemorrhagic encephalopathy, unspecified: Secondary | ICD-10-CM | POA: Diagnosis not present

## 2017-08-16 DIAGNOSIS — G0430 Acute necrotizing hemorrhagic encephalopathy, unspecified: Secondary | ICD-10-CM | POA: Diagnosis not present

## 2017-08-17 DIAGNOSIS — F411 Generalized anxiety disorder: Secondary | ICD-10-CM | POA: Diagnosis not present

## 2017-08-17 DIAGNOSIS — F330 Major depressive disorder, recurrent, mild: Secondary | ICD-10-CM | POA: Diagnosis not present

## 2017-08-17 DIAGNOSIS — G0430 Acute necrotizing hemorrhagic encephalopathy, unspecified: Secondary | ICD-10-CM | POA: Diagnosis not present

## 2017-08-17 DIAGNOSIS — F0281 Dementia in other diseases classified elsewhere with behavioral disturbance: Secondary | ICD-10-CM | POA: Diagnosis not present

## 2017-08-18 DIAGNOSIS — G0430 Acute necrotizing hemorrhagic encephalopathy, unspecified: Secondary | ICD-10-CM | POA: Diagnosis not present

## 2017-08-19 DIAGNOSIS — G0430 Acute necrotizing hemorrhagic encephalopathy, unspecified: Secondary | ICD-10-CM | POA: Diagnosis not present

## 2017-08-20 DIAGNOSIS — G0430 Acute necrotizing hemorrhagic encephalopathy, unspecified: Secondary | ICD-10-CM | POA: Diagnosis not present

## 2017-08-21 DIAGNOSIS — G0430 Acute necrotizing hemorrhagic encephalopathy, unspecified: Secondary | ICD-10-CM | POA: Diagnosis not present

## 2017-08-22 DIAGNOSIS — G0430 Acute necrotizing hemorrhagic encephalopathy, unspecified: Secondary | ICD-10-CM | POA: Diagnosis not present

## 2017-08-23 DIAGNOSIS — G0430 Acute necrotizing hemorrhagic encephalopathy, unspecified: Secondary | ICD-10-CM | POA: Diagnosis not present

## 2017-08-24 DIAGNOSIS — G0430 Acute necrotizing hemorrhagic encephalopathy, unspecified: Secondary | ICD-10-CM | POA: Diagnosis not present

## 2017-08-25 DIAGNOSIS — G0430 Acute necrotizing hemorrhagic encephalopathy, unspecified: Secondary | ICD-10-CM | POA: Diagnosis not present

## 2017-08-26 DIAGNOSIS — G0430 Acute necrotizing hemorrhagic encephalopathy, unspecified: Secondary | ICD-10-CM | POA: Diagnosis not present

## 2017-08-27 DIAGNOSIS — G0430 Acute necrotizing hemorrhagic encephalopathy, unspecified: Secondary | ICD-10-CM | POA: Diagnosis not present

## 2017-08-28 DIAGNOSIS — G0430 Acute necrotizing hemorrhagic encephalopathy, unspecified: Secondary | ICD-10-CM | POA: Diagnosis not present

## 2017-08-29 DIAGNOSIS — G0430 Acute necrotizing hemorrhagic encephalopathy, unspecified: Secondary | ICD-10-CM | POA: Diagnosis not present

## 2017-08-30 DIAGNOSIS — G0430 Acute necrotizing hemorrhagic encephalopathy, unspecified: Secondary | ICD-10-CM | POA: Diagnosis not present

## 2017-08-31 DIAGNOSIS — G0430 Acute necrotizing hemorrhagic encephalopathy, unspecified: Secondary | ICD-10-CM | POA: Diagnosis not present

## 2017-09-01 DIAGNOSIS — G0430 Acute necrotizing hemorrhagic encephalopathy, unspecified: Secondary | ICD-10-CM | POA: Diagnosis not present

## 2017-09-02 DIAGNOSIS — G0430 Acute necrotizing hemorrhagic encephalopathy, unspecified: Secondary | ICD-10-CM | POA: Diagnosis not present

## 2017-09-03 DIAGNOSIS — G0430 Acute necrotizing hemorrhagic encephalopathy, unspecified: Secondary | ICD-10-CM | POA: Diagnosis not present

## 2017-09-04 DIAGNOSIS — G0430 Acute necrotizing hemorrhagic encephalopathy, unspecified: Secondary | ICD-10-CM | POA: Diagnosis not present

## 2017-09-05 DIAGNOSIS — G0430 Acute necrotizing hemorrhagic encephalopathy, unspecified: Secondary | ICD-10-CM | POA: Diagnosis not present

## 2017-09-06 DIAGNOSIS — G0430 Acute necrotizing hemorrhagic encephalopathy, unspecified: Secondary | ICD-10-CM | POA: Diagnosis not present

## 2017-09-07 DIAGNOSIS — G0430 Acute necrotizing hemorrhagic encephalopathy, unspecified: Secondary | ICD-10-CM | POA: Diagnosis not present

## 2017-09-08 DIAGNOSIS — G0430 Acute necrotizing hemorrhagic encephalopathy, unspecified: Secondary | ICD-10-CM | POA: Diagnosis not present

## 2017-09-09 DIAGNOSIS — G0430 Acute necrotizing hemorrhagic encephalopathy, unspecified: Secondary | ICD-10-CM | POA: Diagnosis not present

## 2017-09-09 IMAGING — CR DG CHEST 2V
2 series · 2 of 2 positions shown · non-contrast
Comparison: 04/25/2016

CLINICAL DATA: Cough and tachycardia

EXAM:
CHEST  2 VIEW

[w chest lat]
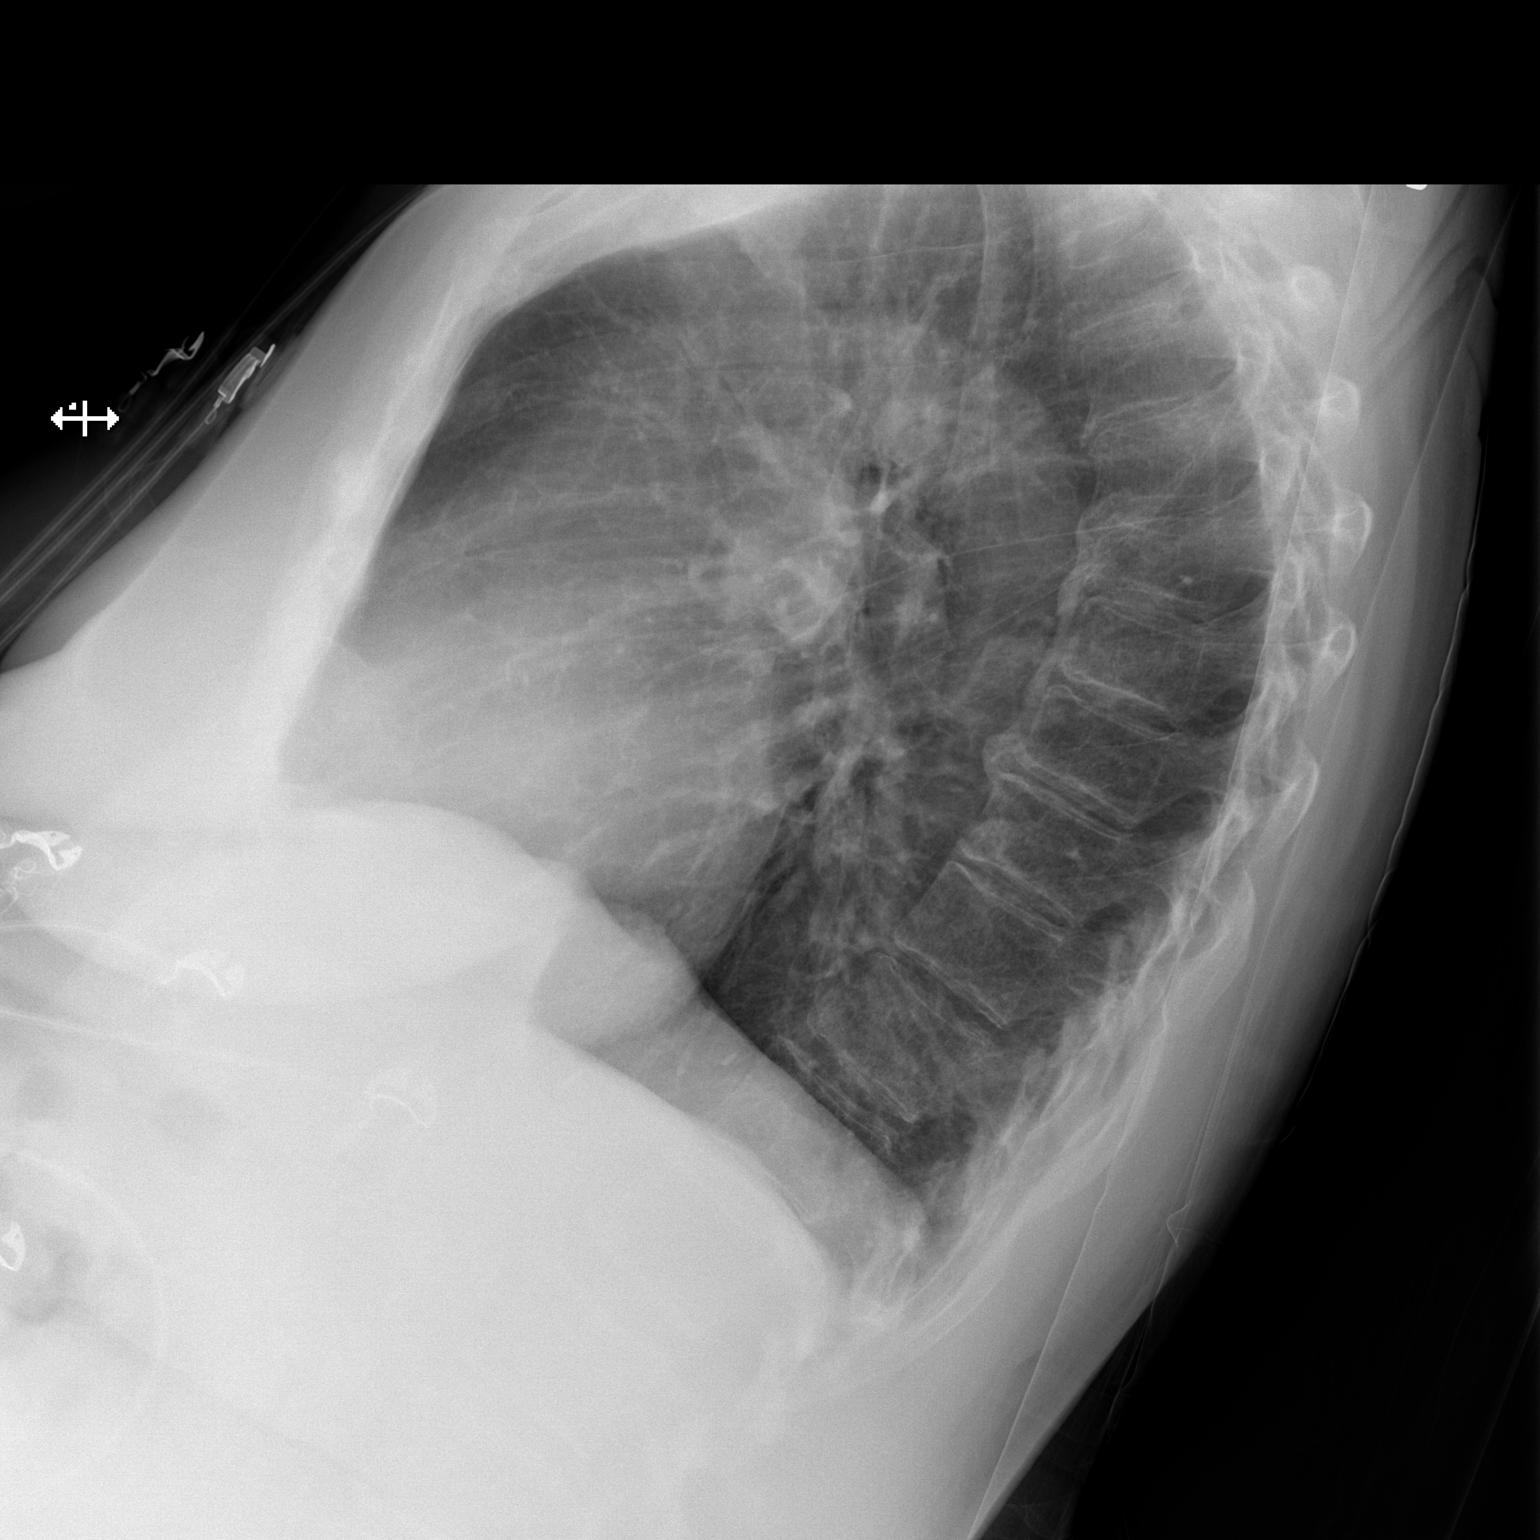

[x chest ap]
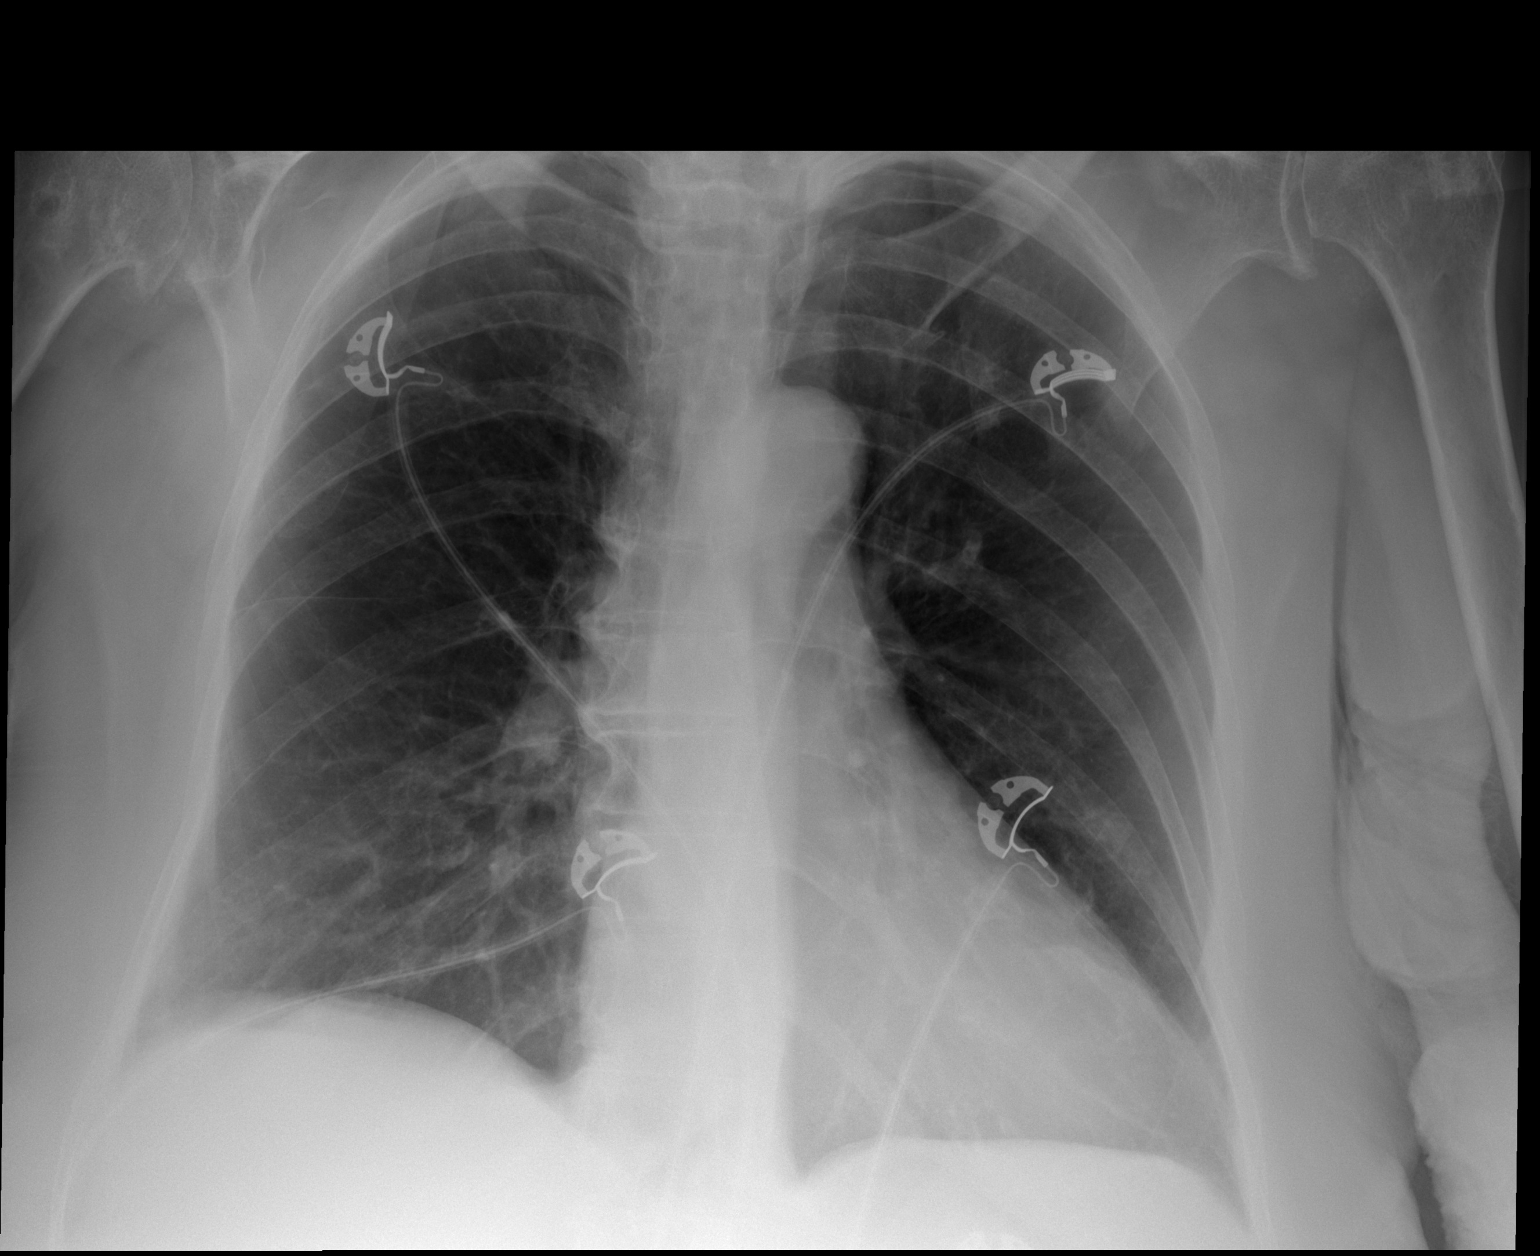

[2 of 2 positions shown; findings below may reference images not displayed]

FINDINGS: The heart size and mediastinal contours are within normal limits.
Both lungs are clear. The visualized skeletal structures are
unremarkable.
IMPRESSION: No active cardiopulmonary disease.

## 2017-09-10 DIAGNOSIS — G0430 Acute necrotizing hemorrhagic encephalopathy, unspecified: Secondary | ICD-10-CM | POA: Diagnosis not present

## 2017-09-11 DIAGNOSIS — G0430 Acute necrotizing hemorrhagic encephalopathy, unspecified: Secondary | ICD-10-CM | POA: Diagnosis not present

## 2017-09-12 DIAGNOSIS — G0430 Acute necrotizing hemorrhagic encephalopathy, unspecified: Secondary | ICD-10-CM | POA: Diagnosis not present

## 2017-09-13 DIAGNOSIS — G0430 Acute necrotizing hemorrhagic encephalopathy, unspecified: Secondary | ICD-10-CM | POA: Diagnosis not present

## 2017-09-14 DIAGNOSIS — G4701 Insomnia due to medical condition: Secondary | ICD-10-CM | POA: Diagnosis not present

## 2017-09-14 DIAGNOSIS — M6281 Muscle weakness (generalized): Secondary | ICD-10-CM | POA: Diagnosis not present

## 2017-09-14 DIAGNOSIS — G0430 Acute necrotizing hemorrhagic encephalopathy, unspecified: Secondary | ICD-10-CM | POA: Diagnosis not present

## 2017-09-14 DIAGNOSIS — F0281 Dementia in other diseases classified elsewhere with behavioral disturbance: Secondary | ICD-10-CM | POA: Diagnosis not present

## 2017-09-14 DIAGNOSIS — G309 Alzheimer's disease, unspecified: Secondary | ICD-10-CM | POA: Diagnosis not present

## 2017-09-15 DIAGNOSIS — G0430 Acute necrotizing hemorrhagic encephalopathy, unspecified: Secondary | ICD-10-CM | POA: Diagnosis not present

## 2017-09-16 DIAGNOSIS — Z8612 Personal history of poliomyelitis: Secondary | ICD-10-CM | POA: Diagnosis not present

## 2017-09-16 DIAGNOSIS — D631 Anemia in chronic kidney disease: Secondary | ICD-10-CM | POA: Diagnosis not present

## 2017-09-16 DIAGNOSIS — G0430 Acute necrotizing hemorrhagic encephalopathy, unspecified: Secondary | ICD-10-CM | POA: Diagnosis not present

## 2017-09-16 DIAGNOSIS — F329 Major depressive disorder, single episode, unspecified: Secondary | ICD-10-CM | POA: Diagnosis not present

## 2017-09-16 DIAGNOSIS — N189 Chronic kidney disease, unspecified: Secondary | ICD-10-CM | POA: Diagnosis not present

## 2017-09-16 DIAGNOSIS — F0390 Unspecified dementia without behavioral disturbance: Secondary | ICD-10-CM | POA: Diagnosis not present

## 2017-09-16 DIAGNOSIS — I129 Hypertensive chronic kidney disease with stage 1 through stage 4 chronic kidney disease, or unspecified chronic kidney disease: Secondary | ICD-10-CM | POA: Diagnosis not present

## 2017-09-16 DIAGNOSIS — N390 Urinary tract infection, site not specified: Secondary | ICD-10-CM | POA: Diagnosis not present

## 2017-09-16 DIAGNOSIS — Z79899 Other long term (current) drug therapy: Secondary | ICD-10-CM | POA: Diagnosis not present

## 2017-09-16 DIAGNOSIS — R319 Hematuria, unspecified: Secondary | ICD-10-CM | POA: Diagnosis not present

## 2017-09-17 DIAGNOSIS — G0430 Acute necrotizing hemorrhagic encephalopathy, unspecified: Secondary | ICD-10-CM | POA: Diagnosis not present

## 2017-09-18 DIAGNOSIS — G0430 Acute necrotizing hemorrhagic encephalopathy, unspecified: Secondary | ICD-10-CM | POA: Diagnosis not present

## 2017-09-19 DIAGNOSIS — G0430 Acute necrotizing hemorrhagic encephalopathy, unspecified: Secondary | ICD-10-CM | POA: Diagnosis not present

## 2017-09-20 DIAGNOSIS — G0430 Acute necrotizing hemorrhagic encephalopathy, unspecified: Secondary | ICD-10-CM | POA: Diagnosis not present

## 2017-09-21 DIAGNOSIS — F329 Major depressive disorder, single episode, unspecified: Secondary | ICD-10-CM | POA: Diagnosis not present

## 2017-09-21 DIAGNOSIS — F0390 Unspecified dementia without behavioral disturbance: Secondary | ICD-10-CM | POA: Diagnosis not present

## 2017-09-21 DIAGNOSIS — I129 Hypertensive chronic kidney disease with stage 1 through stage 4 chronic kidney disease, or unspecified chronic kidney disease: Secondary | ICD-10-CM | POA: Diagnosis not present

## 2017-09-21 DIAGNOSIS — D631 Anemia in chronic kidney disease: Secondary | ICD-10-CM | POA: Diagnosis not present

## 2017-09-21 DIAGNOSIS — N189 Chronic kidney disease, unspecified: Secondary | ICD-10-CM | POA: Diagnosis not present

## 2017-09-21 DIAGNOSIS — Z8612 Personal history of poliomyelitis: Secondary | ICD-10-CM | POA: Diagnosis not present

## 2017-09-21 DIAGNOSIS — G0430 Acute necrotizing hemorrhagic encephalopathy, unspecified: Secondary | ICD-10-CM | POA: Diagnosis not present

## 2017-09-22 DIAGNOSIS — F411 Generalized anxiety disorder: Secondary | ICD-10-CM | POA: Diagnosis not present

## 2017-09-22 DIAGNOSIS — G0430 Acute necrotizing hemorrhagic encephalopathy, unspecified: Secondary | ICD-10-CM | POA: Diagnosis not present

## 2017-09-22 DIAGNOSIS — G309 Alzheimer's disease, unspecified: Secondary | ICD-10-CM | POA: Diagnosis not present

## 2017-09-22 DIAGNOSIS — F0281 Dementia in other diseases classified elsewhere with behavioral disturbance: Secondary | ICD-10-CM | POA: Diagnosis not present

## 2017-09-22 DIAGNOSIS — F330 Major depressive disorder, recurrent, mild: Secondary | ICD-10-CM | POA: Diagnosis not present

## 2017-09-23 DIAGNOSIS — F0390 Unspecified dementia without behavioral disturbance: Secondary | ICD-10-CM | POA: Diagnosis not present

## 2017-09-23 DIAGNOSIS — I129 Hypertensive chronic kidney disease with stage 1 through stage 4 chronic kidney disease, or unspecified chronic kidney disease: Secondary | ICD-10-CM | POA: Diagnosis not present

## 2017-09-23 DIAGNOSIS — Z8612 Personal history of poliomyelitis: Secondary | ICD-10-CM | POA: Diagnosis not present

## 2017-09-23 DIAGNOSIS — N189 Chronic kidney disease, unspecified: Secondary | ICD-10-CM | POA: Diagnosis not present

## 2017-09-23 DIAGNOSIS — F329 Major depressive disorder, single episode, unspecified: Secondary | ICD-10-CM | POA: Diagnosis not present

## 2017-09-23 DIAGNOSIS — G0430 Acute necrotizing hemorrhagic encephalopathy, unspecified: Secondary | ICD-10-CM | POA: Diagnosis not present

## 2017-09-23 DIAGNOSIS — D631 Anemia in chronic kidney disease: Secondary | ICD-10-CM | POA: Diagnosis not present

## 2017-09-24 DIAGNOSIS — G0430 Acute necrotizing hemorrhagic encephalopathy, unspecified: Secondary | ICD-10-CM | POA: Diagnosis not present

## 2017-09-25 DIAGNOSIS — G0430 Acute necrotizing hemorrhagic encephalopathy, unspecified: Secondary | ICD-10-CM | POA: Diagnosis not present

## 2017-09-26 DIAGNOSIS — G0430 Acute necrotizing hemorrhagic encephalopathy, unspecified: Secondary | ICD-10-CM | POA: Diagnosis not present

## 2017-09-27 DIAGNOSIS — G0430 Acute necrotizing hemorrhagic encephalopathy, unspecified: Secondary | ICD-10-CM | POA: Diagnosis not present

## 2017-09-28 DIAGNOSIS — F0390 Unspecified dementia without behavioral disturbance: Secondary | ICD-10-CM | POA: Diagnosis not present

## 2017-09-28 DIAGNOSIS — Z8612 Personal history of poliomyelitis: Secondary | ICD-10-CM | POA: Diagnosis not present

## 2017-09-28 DIAGNOSIS — F329 Major depressive disorder, single episode, unspecified: Secondary | ICD-10-CM | POA: Diagnosis not present

## 2017-09-28 DIAGNOSIS — G0430 Acute necrotizing hemorrhagic encephalopathy, unspecified: Secondary | ICD-10-CM | POA: Diagnosis not present

## 2017-09-28 DIAGNOSIS — D631 Anemia in chronic kidney disease: Secondary | ICD-10-CM | POA: Diagnosis not present

## 2017-09-28 DIAGNOSIS — I129 Hypertensive chronic kidney disease with stage 1 through stage 4 chronic kidney disease, or unspecified chronic kidney disease: Secondary | ICD-10-CM | POA: Diagnosis not present

## 2017-09-28 DIAGNOSIS — N189 Chronic kidney disease, unspecified: Secondary | ICD-10-CM | POA: Diagnosis not present

## 2017-09-29 DIAGNOSIS — G0430 Acute necrotizing hemorrhagic encephalopathy, unspecified: Secondary | ICD-10-CM | POA: Diagnosis not present

## 2017-09-30 DIAGNOSIS — G0430 Acute necrotizing hemorrhagic encephalopathy, unspecified: Secondary | ICD-10-CM | POA: Diagnosis not present

## 2017-09-30 DIAGNOSIS — F0390 Unspecified dementia without behavioral disturbance: Secondary | ICD-10-CM | POA: Diagnosis not present

## 2017-09-30 DIAGNOSIS — D631 Anemia in chronic kidney disease: Secondary | ICD-10-CM | POA: Diagnosis not present

## 2017-09-30 DIAGNOSIS — F329 Major depressive disorder, single episode, unspecified: Secondary | ICD-10-CM | POA: Diagnosis not present

## 2017-09-30 DIAGNOSIS — Z8612 Personal history of poliomyelitis: Secondary | ICD-10-CM | POA: Diagnosis not present

## 2017-09-30 DIAGNOSIS — I129 Hypertensive chronic kidney disease with stage 1 through stage 4 chronic kidney disease, or unspecified chronic kidney disease: Secondary | ICD-10-CM | POA: Diagnosis not present

## 2017-09-30 DIAGNOSIS — N189 Chronic kidney disease, unspecified: Secondary | ICD-10-CM | POA: Diagnosis not present

## 2017-10-01 DIAGNOSIS — G0430 Acute necrotizing hemorrhagic encephalopathy, unspecified: Secondary | ICD-10-CM | POA: Diagnosis not present

## 2017-10-02 DIAGNOSIS — G0430 Acute necrotizing hemorrhagic encephalopathy, unspecified: Secondary | ICD-10-CM | POA: Diagnosis not present

## 2017-10-03 DIAGNOSIS — G0430 Acute necrotizing hemorrhagic encephalopathy, unspecified: Secondary | ICD-10-CM | POA: Diagnosis not present

## 2017-10-04 DIAGNOSIS — G0430 Acute necrotizing hemorrhagic encephalopathy, unspecified: Secondary | ICD-10-CM | POA: Diagnosis not present

## 2017-10-05 DIAGNOSIS — N189 Chronic kidney disease, unspecified: Secondary | ICD-10-CM | POA: Diagnosis not present

## 2017-10-05 DIAGNOSIS — F329 Major depressive disorder, single episode, unspecified: Secondary | ICD-10-CM | POA: Diagnosis not present

## 2017-10-05 DIAGNOSIS — Z8612 Personal history of poliomyelitis: Secondary | ICD-10-CM | POA: Diagnosis not present

## 2017-10-05 DIAGNOSIS — L259 Unspecified contact dermatitis, unspecified cause: Secondary | ICD-10-CM | POA: Diagnosis not present

## 2017-10-05 DIAGNOSIS — G309 Alzheimer's disease, unspecified: Secondary | ICD-10-CM | POA: Diagnosis not present

## 2017-10-05 DIAGNOSIS — I129 Hypertensive chronic kidney disease with stage 1 through stage 4 chronic kidney disease, or unspecified chronic kidney disease: Secondary | ICD-10-CM | POA: Diagnosis not present

## 2017-10-05 DIAGNOSIS — F0281 Dementia in other diseases classified elsewhere with behavioral disturbance: Secondary | ICD-10-CM | POA: Diagnosis not present

## 2017-10-05 DIAGNOSIS — D631 Anemia in chronic kidney disease: Secondary | ICD-10-CM | POA: Diagnosis not present

## 2017-10-05 DIAGNOSIS — G0430 Acute necrotizing hemorrhagic encephalopathy, unspecified: Secondary | ICD-10-CM | POA: Diagnosis not present

## 2017-10-05 DIAGNOSIS — F0390 Unspecified dementia without behavioral disturbance: Secondary | ICD-10-CM | POA: Diagnosis not present

## 2017-10-06 DIAGNOSIS — G0430 Acute necrotizing hemorrhagic encephalopathy, unspecified: Secondary | ICD-10-CM | POA: Diagnosis not present

## 2017-10-07 DIAGNOSIS — G0430 Acute necrotizing hemorrhagic encephalopathy, unspecified: Secondary | ICD-10-CM | POA: Diagnosis not present

## 2017-10-08 DIAGNOSIS — F329 Major depressive disorder, single episode, unspecified: Secondary | ICD-10-CM | POA: Diagnosis not present

## 2017-10-08 DIAGNOSIS — G0430 Acute necrotizing hemorrhagic encephalopathy, unspecified: Secondary | ICD-10-CM | POA: Diagnosis not present

## 2017-10-08 DIAGNOSIS — D631 Anemia in chronic kidney disease: Secondary | ICD-10-CM | POA: Diagnosis not present

## 2017-10-08 DIAGNOSIS — Z8612 Personal history of poliomyelitis: Secondary | ICD-10-CM | POA: Diagnosis not present

## 2017-10-08 DIAGNOSIS — I129 Hypertensive chronic kidney disease with stage 1 through stage 4 chronic kidney disease, or unspecified chronic kidney disease: Secondary | ICD-10-CM | POA: Diagnosis not present

## 2017-10-08 DIAGNOSIS — F0390 Unspecified dementia without behavioral disturbance: Secondary | ICD-10-CM | POA: Diagnosis not present

## 2017-10-08 DIAGNOSIS — N189 Chronic kidney disease, unspecified: Secondary | ICD-10-CM | POA: Diagnosis not present

## 2017-10-09 DIAGNOSIS — G0430 Acute necrotizing hemorrhagic encephalopathy, unspecified: Secondary | ICD-10-CM | POA: Diagnosis not present

## 2017-10-10 DIAGNOSIS — G0430 Acute necrotizing hemorrhagic encephalopathy, unspecified: Secondary | ICD-10-CM | POA: Diagnosis not present

## 2017-10-11 DIAGNOSIS — G0430 Acute necrotizing hemorrhagic encephalopathy, unspecified: Secondary | ICD-10-CM | POA: Diagnosis not present

## 2017-10-12 DIAGNOSIS — G0430 Acute necrotizing hemorrhagic encephalopathy, unspecified: Secondary | ICD-10-CM | POA: Diagnosis not present

## 2017-10-13 DIAGNOSIS — F0390 Unspecified dementia without behavioral disturbance: Secondary | ICD-10-CM | POA: Diagnosis not present

## 2017-10-13 DIAGNOSIS — I129 Hypertensive chronic kidney disease with stage 1 through stage 4 chronic kidney disease, or unspecified chronic kidney disease: Secondary | ICD-10-CM | POA: Diagnosis not present

## 2017-10-13 DIAGNOSIS — D631 Anemia in chronic kidney disease: Secondary | ICD-10-CM | POA: Diagnosis not present

## 2017-10-13 DIAGNOSIS — F329 Major depressive disorder, single episode, unspecified: Secondary | ICD-10-CM | POA: Diagnosis not present

## 2017-10-13 DIAGNOSIS — N189 Chronic kidney disease, unspecified: Secondary | ICD-10-CM | POA: Diagnosis not present

## 2017-10-13 DIAGNOSIS — Z8612 Personal history of poliomyelitis: Secondary | ICD-10-CM | POA: Diagnosis not present

## 2017-10-13 DIAGNOSIS — G0430 Acute necrotizing hemorrhagic encephalopathy, unspecified: Secondary | ICD-10-CM | POA: Diagnosis not present

## 2017-10-14 DIAGNOSIS — G0430 Acute necrotizing hemorrhagic encephalopathy, unspecified: Secondary | ICD-10-CM | POA: Diagnosis not present

## 2017-10-15 DIAGNOSIS — G0430 Acute necrotizing hemorrhagic encephalopathy, unspecified: Secondary | ICD-10-CM | POA: Diagnosis not present

## 2017-10-16 DIAGNOSIS — G0430 Acute necrotizing hemorrhagic encephalopathy, unspecified: Secondary | ICD-10-CM | POA: Diagnosis not present

## 2017-10-17 DIAGNOSIS — G0430 Acute necrotizing hemorrhagic encephalopathy, unspecified: Secondary | ICD-10-CM | POA: Diagnosis not present

## 2017-10-18 DIAGNOSIS — G0430 Acute necrotizing hemorrhagic encephalopathy, unspecified: Secondary | ICD-10-CM | POA: Diagnosis not present

## 2017-10-19 DIAGNOSIS — G0430 Acute necrotizing hemorrhagic encephalopathy, unspecified: Secondary | ICD-10-CM | POA: Diagnosis not present

## 2017-10-20 DIAGNOSIS — G0430 Acute necrotizing hemorrhagic encephalopathy, unspecified: Secondary | ICD-10-CM | POA: Diagnosis not present

## 2017-10-21 DIAGNOSIS — G0430 Acute necrotizing hemorrhagic encephalopathy, unspecified: Secondary | ICD-10-CM | POA: Diagnosis not present

## 2017-10-22 DIAGNOSIS — G0430 Acute necrotizing hemorrhagic encephalopathy, unspecified: Secondary | ICD-10-CM | POA: Diagnosis not present

## 2017-10-23 DIAGNOSIS — G0430 Acute necrotizing hemorrhagic encephalopathy, unspecified: Secondary | ICD-10-CM | POA: Diagnosis not present

## 2017-10-24 DIAGNOSIS — G0430 Acute necrotizing hemorrhagic encephalopathy, unspecified: Secondary | ICD-10-CM | POA: Diagnosis not present

## 2017-10-25 DIAGNOSIS — G0430 Acute necrotizing hemorrhagic encephalopathy, unspecified: Secondary | ICD-10-CM | POA: Diagnosis not present

## 2017-10-26 DIAGNOSIS — G0430 Acute necrotizing hemorrhagic encephalopathy, unspecified: Secondary | ICD-10-CM | POA: Diagnosis not present

## 2017-10-26 IMAGING — DX DG PELVIS 1-2V
1 series · 1 of 1 positions shown · non-contrast
Comparison: 05/22/2016

CLINICAL DATA: Fall with pelvic pain

EXAM:
PELVIS - 1-2 VIEW

[pelvis ap]
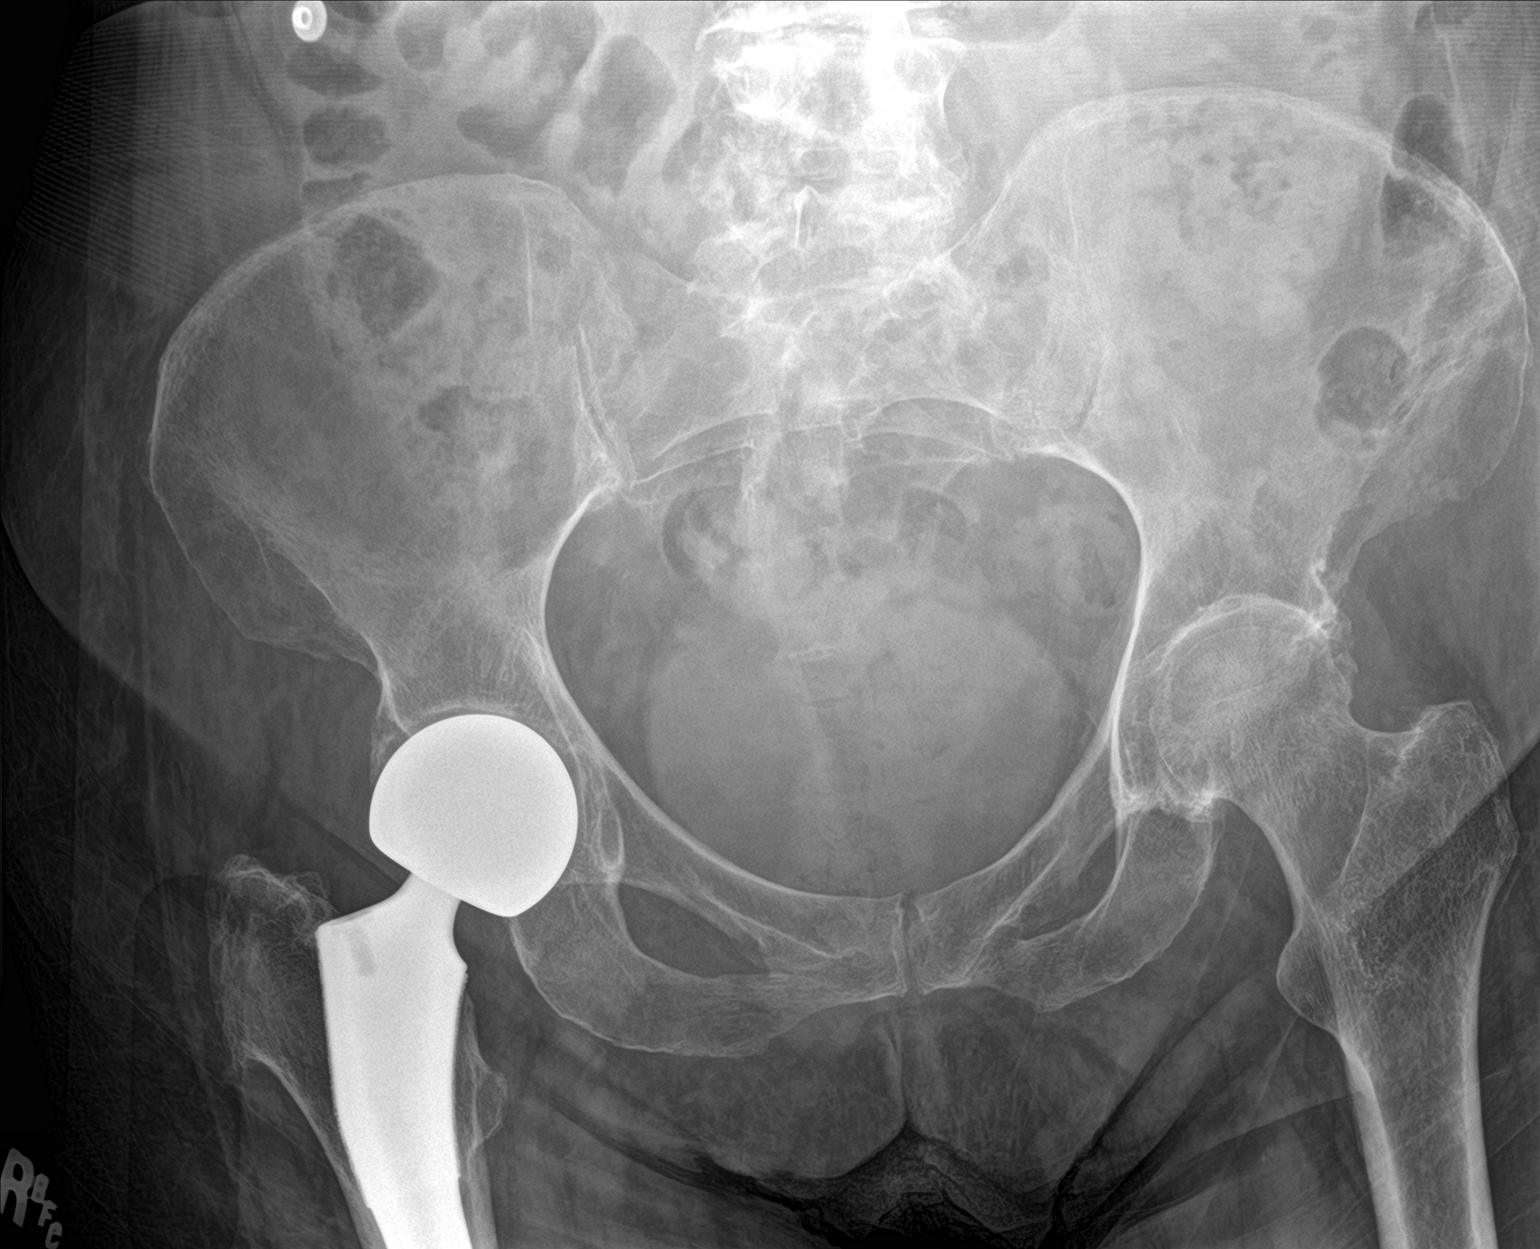

[1 of 1 positions shown; findings below may reference images not displayed]

FINDINGS: Patient is status post right hip replacement. No dislocation is
evident. Bones appear osteopenic. No definite acute displaced
fracture. Mild to moderate arthritis left hip. Right greater than
left SI joint disease.
IMPRESSION: Status post right hip replacement. No acute fracture or
malalignment.

## 2017-10-27 DIAGNOSIS — G0430 Acute necrotizing hemorrhagic encephalopathy, unspecified: Secondary | ICD-10-CM | POA: Diagnosis not present

## 2017-10-27 IMAGING — CT CT CERVICAL SPINE W/O CM
5 of 8 series · 12 of 33 positions shown, 13 images · non-contrast
Comparison: 05/22/2016

CLINICAL DATA: Multiple unwitnessed fall today. Hematoma of the
head and neck stiffness.

EXAM:
CT HEAD WITHOUT CONTRAST
CT CERVICAL SPINE WITHOUT CONTRAST
TECHNIQUE: Multidetector CT imaging of the head and cervical spine was
performed following the standard protocol without intravenous
contrast. Multiplanar CT image reconstructions of the cervical spine
were also generated.

[Series 202: head w/o bone, idose (1) · axial · non-contrast · 0.43mm/px · z∈[+245,+300]mm · 2 of 68 slices shown]
[im 23/68  bone]
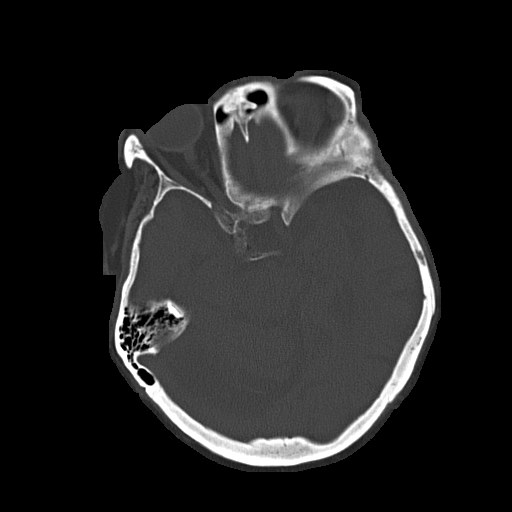
[im 45/68  bone]
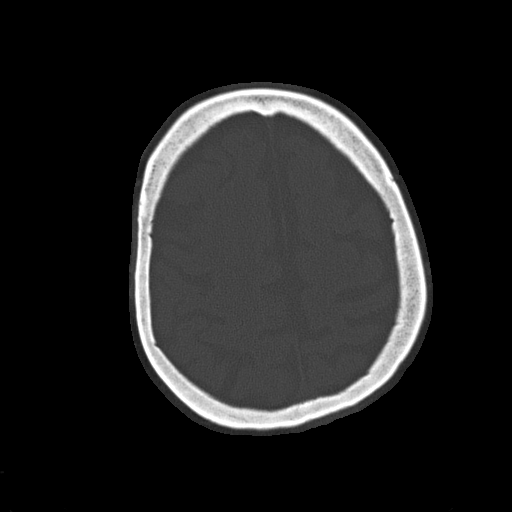

[Series 204: sagittal st, idose (1) · sagittal · 0.40mm/px · 5 of 73 slices shown]
[im 13/73  bone]
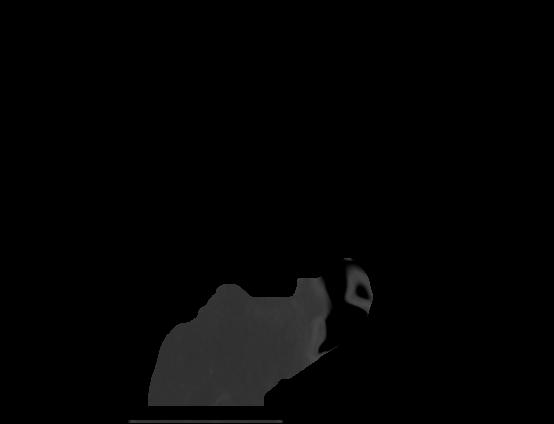
[im 25/73  bone]
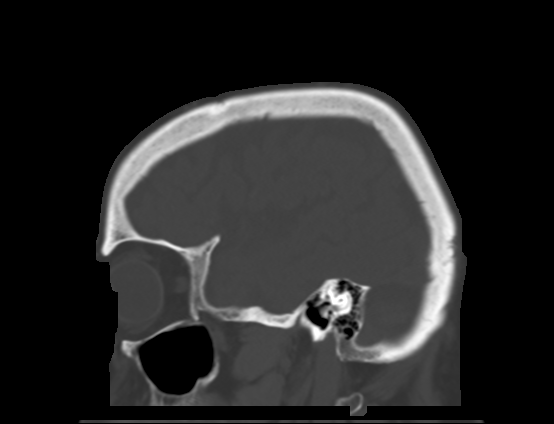
[im 37/73  bone]
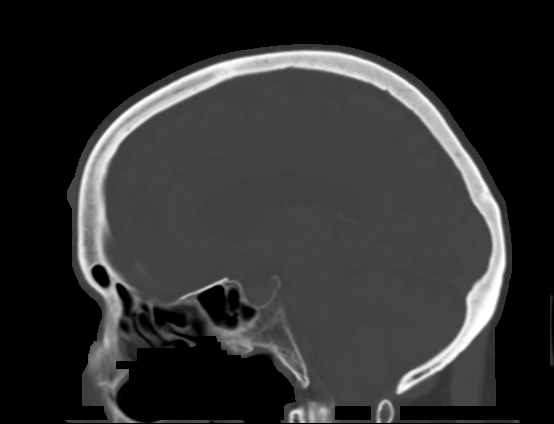
[im 49/73  bone]
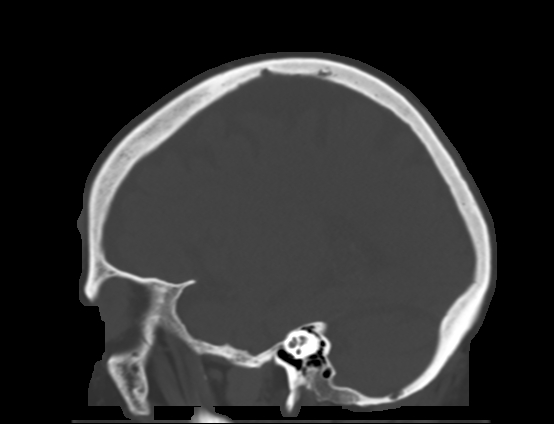
[im 61/73  bone]
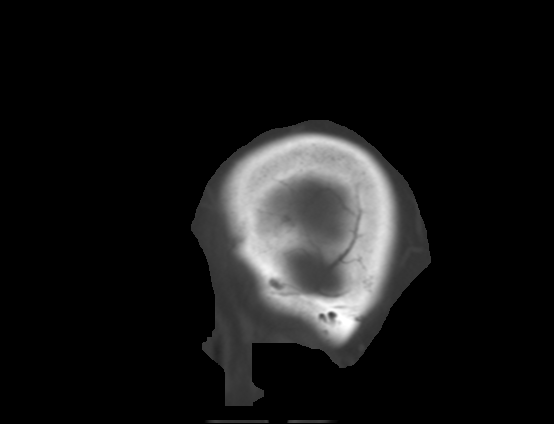

[Series 206: coronal st, idose (1) · coronal · 0.42mm/px · 1 of 68 slices shown]
[im 34/68  bone]
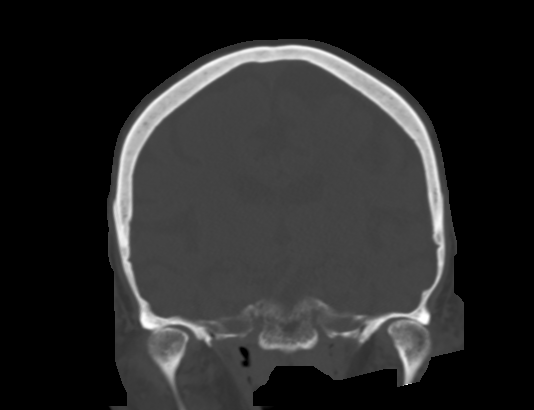

[Series 302: soft tissue, idose (2) · axial · 0.29mm/px · z∈[+118,+176]mm · 2 of 88 slices shown]
[im 30/88  soft-tissue]
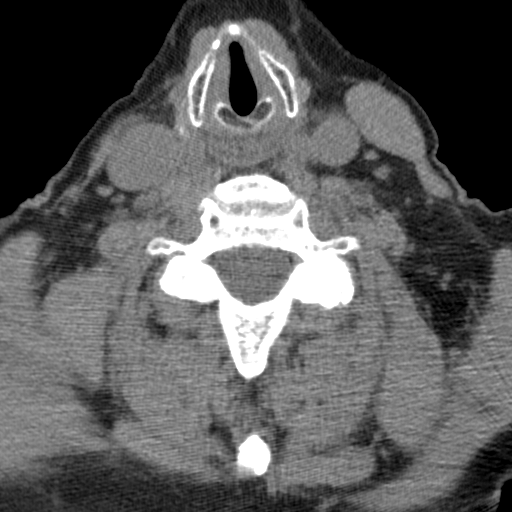
[im 59/88  soft-tissue]
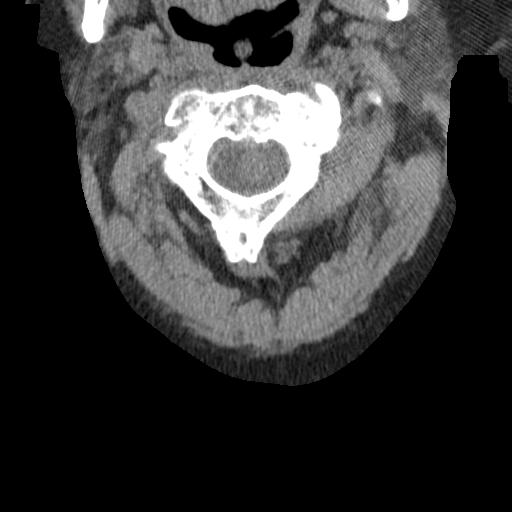

[Series 306: orthogonals, idose (2) · axial · 0.35mm/px · z∈[+103,+160]mm · 2 of 88 slices shown, 3 images]
[im 30/88  soft-tissue]
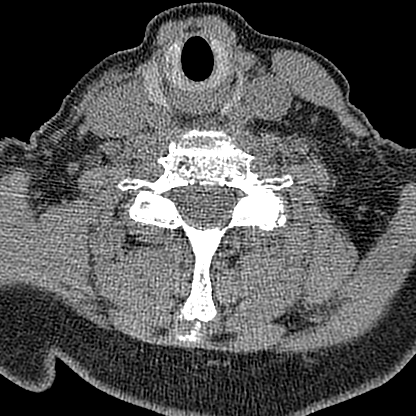
[im 30/88  bone]
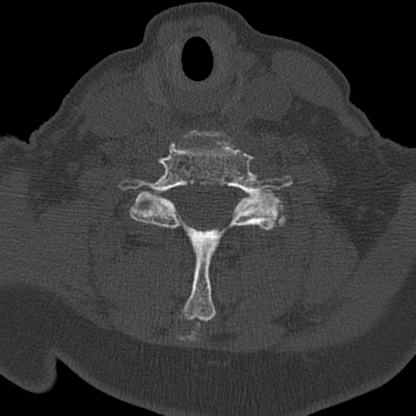
[im 59/88  bone]
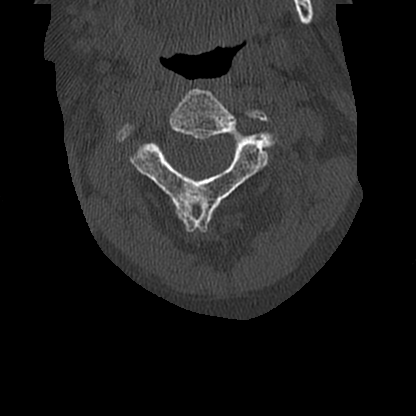

[12 of 33 positions shown; findings below may reference images not displayed]

FINDINGS: CT HEAD FINDINGS

BRAIN: There is sulcal and ventricular prominence consistent with
superficial and central atrophy. No intraparenchymal hemorrhage,
mass effect nor midline shift. Periventricular and subcortical white
matter hypodensities consistent with chronic small vessel ischemic
disease are identified. No acute large vascular territory infarcts.
No abnormal extra-axial fluid collections. Basal cisterns are not
effaced and midline.

VASCULAR: Mild mild calcific atherosclerosis of the carotid siphons.
No hyperdense vessels

SKULL: No skull fracture. Forehead and left supraorbital soft tissue
swelling.

SINUSES/ORBITS: The mastoid air-cells are clear. The included
paranasal sinuses are well-aerated.The included ocular globes and
orbital contents are non-suspicious. Bilateral lens replacements.

OTHER: None.

CT CERVICAL SPINE FINDINGS

Alignment: Normal.

Skull base and vertebrae: No acute fracture. No primary bone lesion
or focal pathologic process.

Soft tissues and spinal canal: No prevertebral fluid or swelling. No
visible canal hematoma.

Disc levels: Chronic degenerative disc disease most prominent from
C5-6 through C6-7 with small posterior marginal osteophytes and mild
disc bulges. Chronic stable disc bulge at C3-4. Facet joints are
aligned. There is mild osteoarthritis about the atlantodental
interval. Minimal degenerative soft tissue ligamentous
calcifications adjacent to the C7 spinous process as before.

Upper chest: Nonacute

Other: Negative
IMPRESSION: 1. Cerebral atrophy with chronic appearing microvascular ischemic
disease, stable in appearance.
2. No acute cervical spine fracture or subluxation.
3. No acute intracranial abnormality.
4. Forehead and left supraorbital soft tissue swelling. No
underlying fracture.

## 2017-10-28 DIAGNOSIS — N189 Chronic kidney disease, unspecified: Secondary | ICD-10-CM | POA: Diagnosis not present

## 2017-10-28 DIAGNOSIS — G0430 Acute necrotizing hemorrhagic encephalopathy, unspecified: Secondary | ICD-10-CM | POA: Diagnosis not present

## 2017-10-28 DIAGNOSIS — F329 Major depressive disorder, single episode, unspecified: Secondary | ICD-10-CM | POA: Diagnosis not present

## 2017-10-28 DIAGNOSIS — D631 Anemia in chronic kidney disease: Secondary | ICD-10-CM | POA: Diagnosis not present

## 2017-10-28 DIAGNOSIS — F0390 Unspecified dementia without behavioral disturbance: Secondary | ICD-10-CM | POA: Diagnosis not present

## 2017-10-28 DIAGNOSIS — Z8612 Personal history of poliomyelitis: Secondary | ICD-10-CM | POA: Diagnosis not present

## 2017-10-28 DIAGNOSIS — I129 Hypertensive chronic kidney disease with stage 1 through stage 4 chronic kidney disease, or unspecified chronic kidney disease: Secondary | ICD-10-CM | POA: Diagnosis not present

## 2017-10-29 DIAGNOSIS — G0430 Acute necrotizing hemorrhagic encephalopathy, unspecified: Secondary | ICD-10-CM | POA: Diagnosis not present

## 2017-10-30 DIAGNOSIS — G0430 Acute necrotizing hemorrhagic encephalopathy, unspecified: Secondary | ICD-10-CM | POA: Diagnosis not present

## 2017-10-31 DIAGNOSIS — G0430 Acute necrotizing hemorrhagic encephalopathy, unspecified: Secondary | ICD-10-CM | POA: Diagnosis not present

## 2017-11-01 DIAGNOSIS — G0430 Acute necrotizing hemorrhagic encephalopathy, unspecified: Secondary | ICD-10-CM | POA: Diagnosis not present

## 2017-11-02 DIAGNOSIS — G0430 Acute necrotizing hemorrhagic encephalopathy, unspecified: Secondary | ICD-10-CM | POA: Diagnosis not present

## 2017-11-03 DIAGNOSIS — G0430 Acute necrotizing hemorrhagic encephalopathy, unspecified: Secondary | ICD-10-CM | POA: Diagnosis not present

## 2017-11-04 DIAGNOSIS — G0430 Acute necrotizing hemorrhagic encephalopathy, unspecified: Secondary | ICD-10-CM | POA: Diagnosis not present

## 2017-11-05 DIAGNOSIS — G0430 Acute necrotizing hemorrhagic encephalopathy, unspecified: Secondary | ICD-10-CM | POA: Diagnosis not present

## 2017-11-06 DIAGNOSIS — G0430 Acute necrotizing hemorrhagic encephalopathy, unspecified: Secondary | ICD-10-CM | POA: Diagnosis not present

## 2017-11-07 DIAGNOSIS — G0430 Acute necrotizing hemorrhagic encephalopathy, unspecified: Secondary | ICD-10-CM | POA: Diagnosis not present

## 2017-11-08 DIAGNOSIS — G0430 Acute necrotizing hemorrhagic encephalopathy, unspecified: Secondary | ICD-10-CM | POA: Diagnosis not present

## 2017-11-09 DIAGNOSIS — G0430 Acute necrotizing hemorrhagic encephalopathy, unspecified: Secondary | ICD-10-CM | POA: Diagnosis not present

## 2017-11-10 DIAGNOSIS — G0430 Acute necrotizing hemorrhagic encephalopathy, unspecified: Secondary | ICD-10-CM | POA: Diagnosis not present

## 2017-11-11 DIAGNOSIS — G0430 Acute necrotizing hemorrhagic encephalopathy, unspecified: Secondary | ICD-10-CM | POA: Diagnosis not present

## 2017-11-12 DIAGNOSIS — G0430 Acute necrotizing hemorrhagic encephalopathy, unspecified: Secondary | ICD-10-CM | POA: Diagnosis not present

## 2017-11-13 DIAGNOSIS — G0430 Acute necrotizing hemorrhagic encephalopathy, unspecified: Secondary | ICD-10-CM | POA: Diagnosis not present

## 2017-11-14 DIAGNOSIS — G0430 Acute necrotizing hemorrhagic encephalopathy, unspecified: Secondary | ICD-10-CM | POA: Diagnosis not present

## 2017-11-14 IMAGING — CT CT HEAD W/O CM
3 of 8 series · 14 of 47 positions shown, 17 images · non-contrast
Comparison: Head and cervical spine CT 18 days prior 08/09/2016

CLINICAL DATA: Fall from toilet.  Dementia patient.

EXAM:
CT HEAD WITHOUT CONTRAST
CT CERVICAL SPINE WITHOUT CONTRAST
TECHNIQUE: Multidetector CT imaging of the head and cervical spine was
performed following the standard protocol without intravenous
contrast. Multiplanar CT image reconstructions of the cervical spine
were also generated.

[Series 9: coronal · coronal · 0.28mm/px · 3 of 66 slices shown]
[im 25/66  brain]
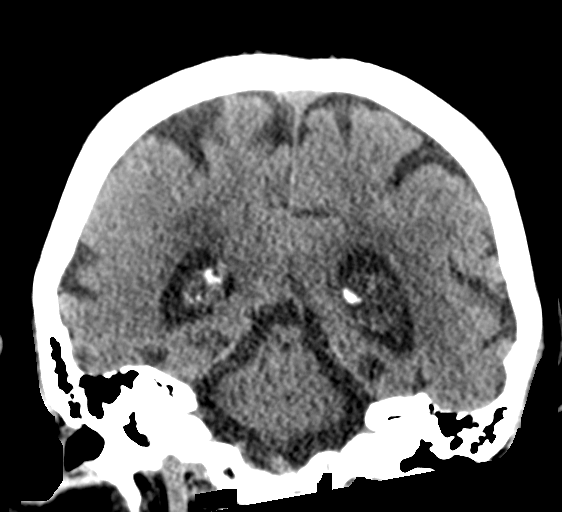
[im 33/66  brain]
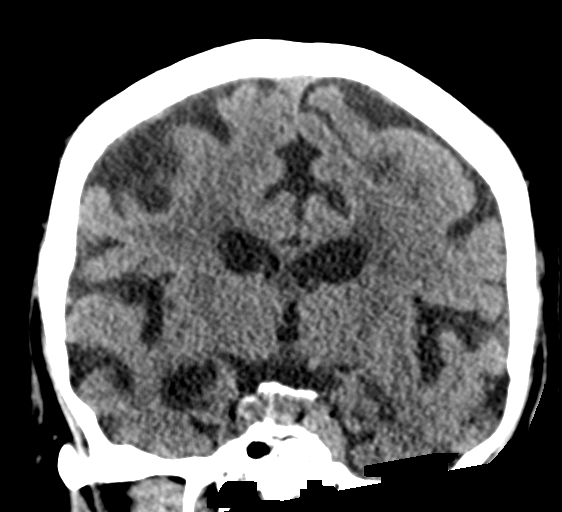
[im 41/66  brain]
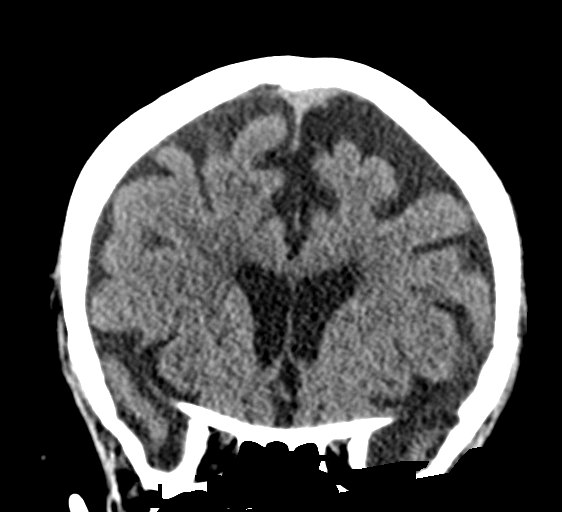

[Series 11: axial recon · axial · 0.23mm/px · z∈[-320,-170]mm · 9 of 102 slices shown, 12 images]
[im 11/102  brain]
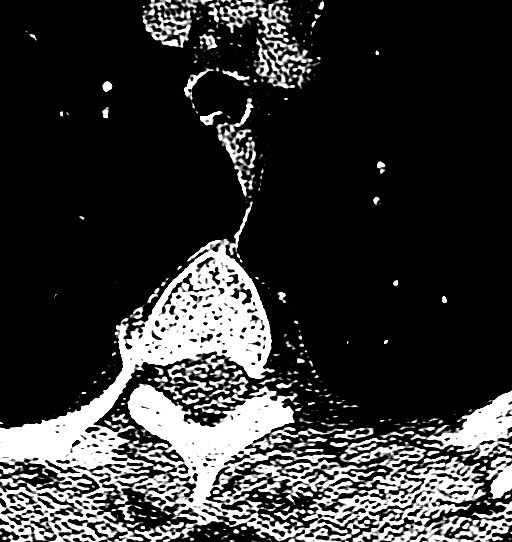
[im 11/102  bone]
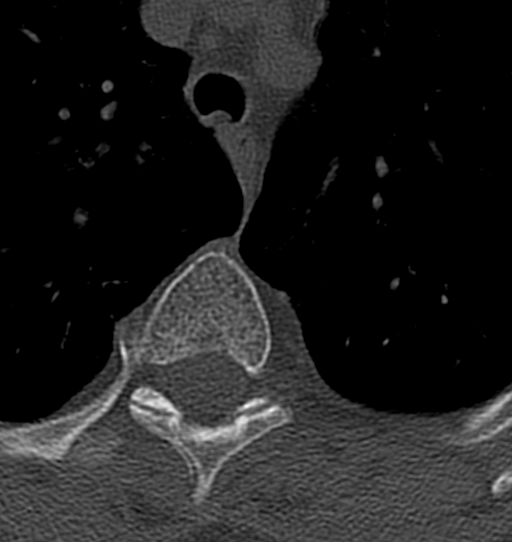
[im 21/102  brain]
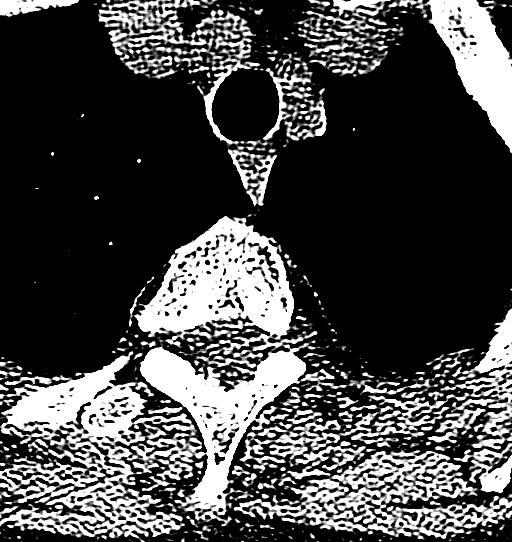
[im 31/102  brain]
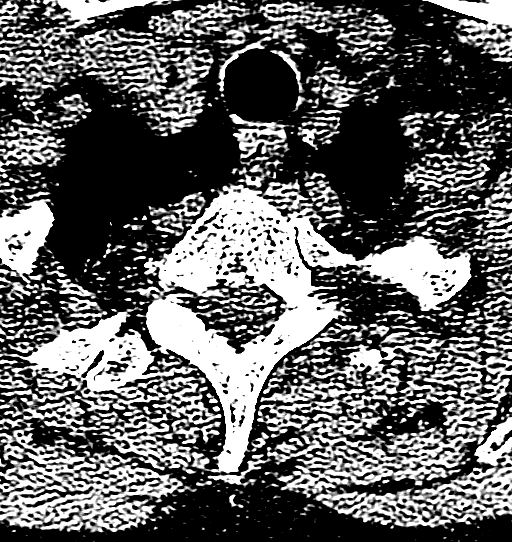
[im 41/102  brain]
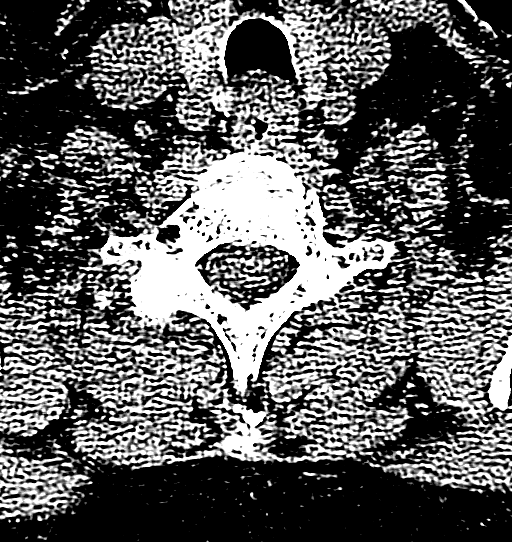
[im 51/102  brain]
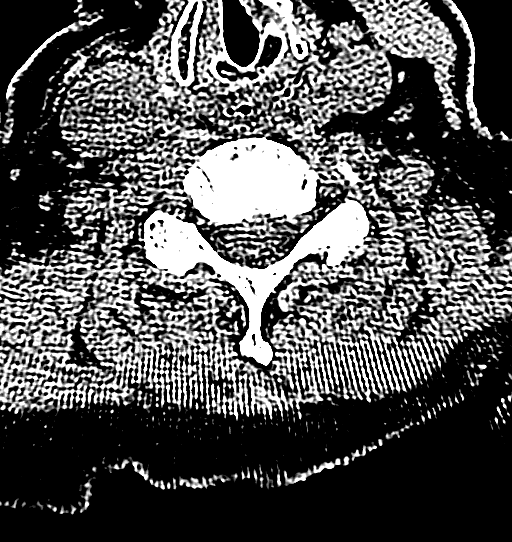
[im 51/102  bone]
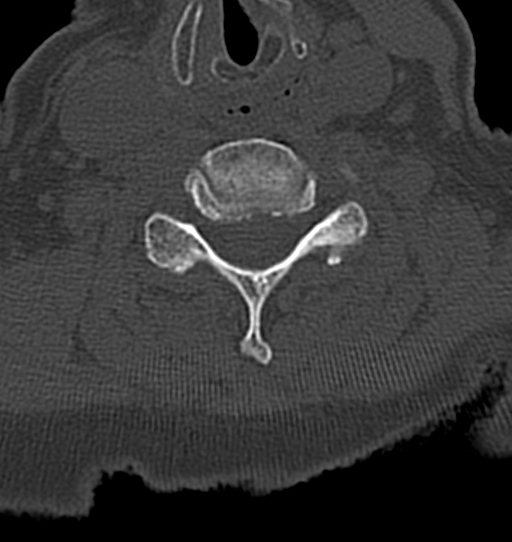
[im 61/102  brain]
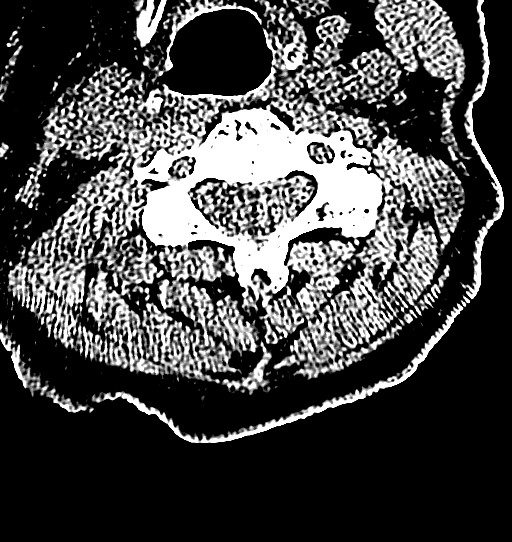
[im 71/102  brain]
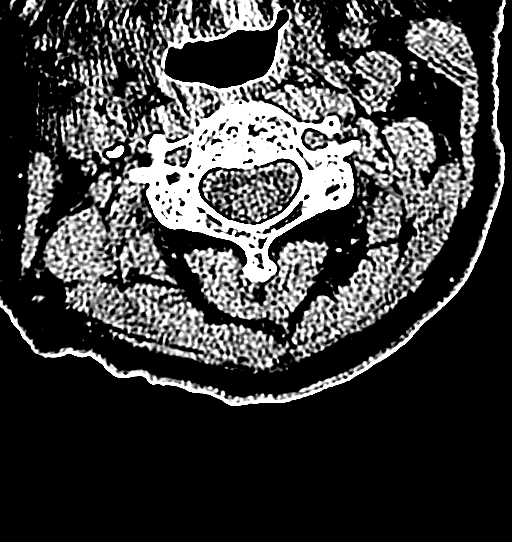
[im 81/102  brain]
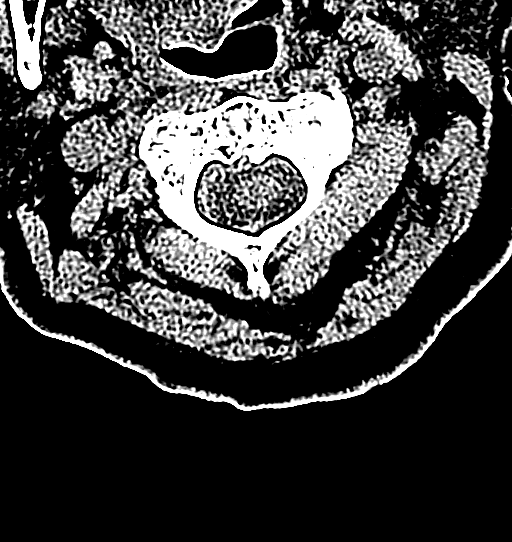
[im 91/102  brain]
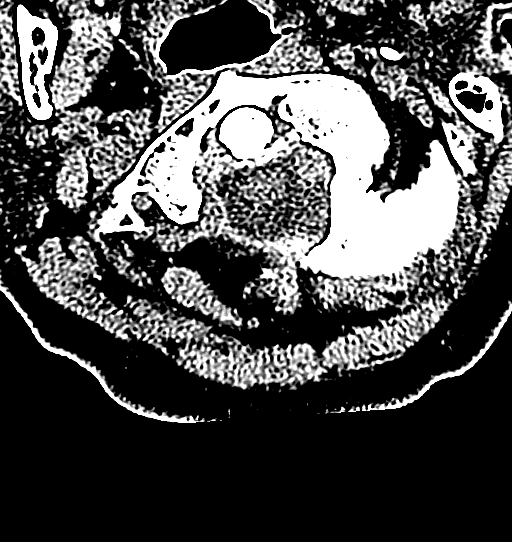
[im 91/102  bone]
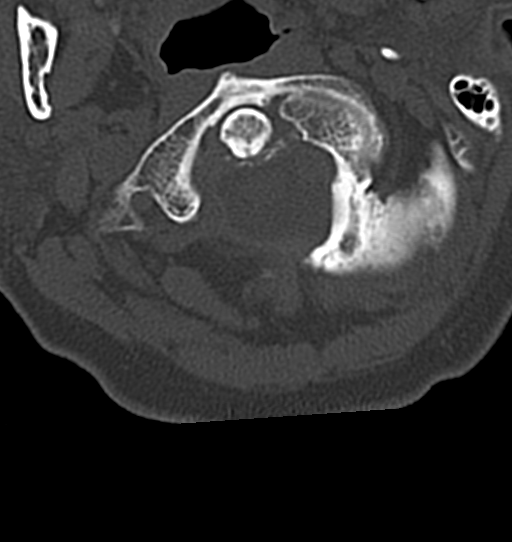

[Series 13: sagittal · sagittal · 0.36mm/px · 2 of 61 slices shown]
[im 21/61  brain]
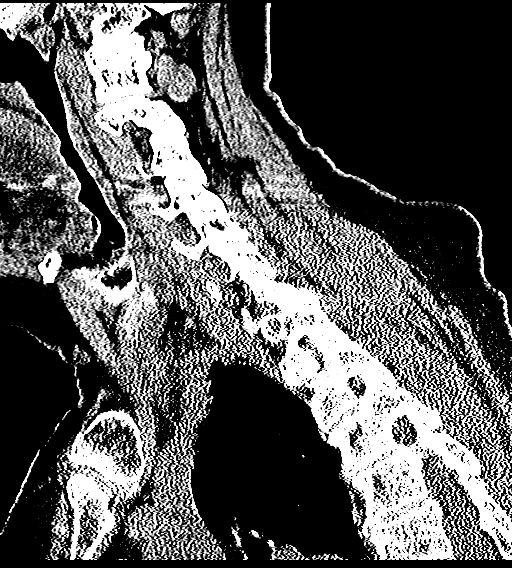
[im 41/61  brain]
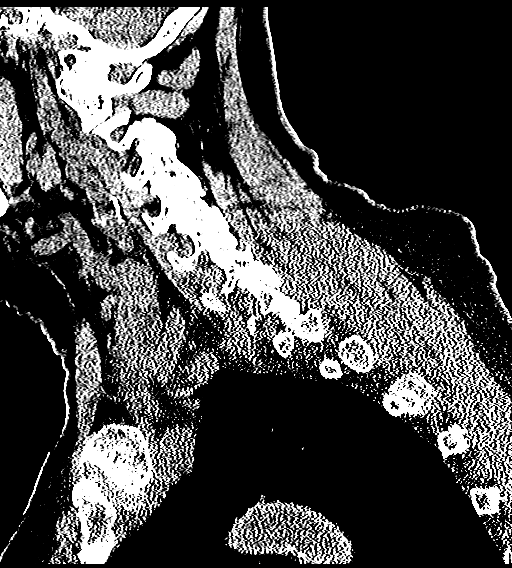

[14 of 47 positions shown; findings below may reference images not displayed]

FINDINGS: CT HEAD FINDINGS

Brain: Stable atrophy and chronic small vessel ischemia. Remote
lacunar infarct in left basal ganglia, unchanged. No acute
hemorrhage or evidence of acute ischemia. No subdural fluid
collection. No hydrocephalus, mass effect or midline shift.

Vascular: Atherosclerosis of skullbase vasculature without
hyperdense vessel or abnormal calcification.

Skull: No skull fracture.

Sinuses/Orbits: Stable fluid in left side of sphenoid sinus.
Remaining included paranasal sinuses are clear. Mastoid air cells
are well-aerated.

Other: Diminished frontal scalp hematomas from prior exam.

CT CERVICAL SPINE FINDINGS

Alignment: Normal.  Patient's head is tilted in the scanner.

Skull base and vertebrae: No acute fracture. Vertebral body heights
are maintained. Skull base and dens are intact. Bony under
mineralization.

Soft tissues and spinal canal: No prevertebral fluid or swelling. No
visible canal hematoma.

Disc levels: Disc space narrowing and endplate spurring is most
prominent at C5-C6 and C6-C7. Scattered facet arthropathy.

Upper chest: No acute abnormality.

Other: Carotid vascular calcifications.
IMPRESSION: 1. Stable atrophy and chronic small vessel ischemia without acute
intracranial abnormality. No skull fracture.
2. Stable degenerative change in the cervical spine without acute
fracture or subluxation.

## 2017-11-15 DIAGNOSIS — G0430 Acute necrotizing hemorrhagic encephalopathy, unspecified: Secondary | ICD-10-CM | POA: Diagnosis not present

## 2017-11-16 DIAGNOSIS — G0430 Acute necrotizing hemorrhagic encephalopathy, unspecified: Secondary | ICD-10-CM | POA: Diagnosis not present

## 2017-11-17 DIAGNOSIS — G0430 Acute necrotizing hemorrhagic encephalopathy, unspecified: Secondary | ICD-10-CM | POA: Diagnosis not present

## 2017-11-18 DIAGNOSIS — G0430 Acute necrotizing hemorrhagic encephalopathy, unspecified: Secondary | ICD-10-CM | POA: Diagnosis not present

## 2017-11-19 DIAGNOSIS — G0430 Acute necrotizing hemorrhagic encephalopathy, unspecified: Secondary | ICD-10-CM | POA: Diagnosis not present

## 2017-11-20 DIAGNOSIS — G0430 Acute necrotizing hemorrhagic encephalopathy, unspecified: Secondary | ICD-10-CM | POA: Diagnosis not present

## 2017-11-21 DIAGNOSIS — G0430 Acute necrotizing hemorrhagic encephalopathy, unspecified: Secondary | ICD-10-CM | POA: Diagnosis not present

## 2017-11-22 DIAGNOSIS — G0430 Acute necrotizing hemorrhagic encephalopathy, unspecified: Secondary | ICD-10-CM | POA: Diagnosis not present

## 2017-11-23 DIAGNOSIS — F0281 Dementia in other diseases classified elsewhere with behavioral disturbance: Secondary | ICD-10-CM | POA: Diagnosis not present

## 2017-11-23 DIAGNOSIS — G309 Alzheimer's disease, unspecified: Secondary | ICD-10-CM | POA: Diagnosis not present

## 2017-11-23 DIAGNOSIS — L299 Pruritus, unspecified: Secondary | ICD-10-CM | POA: Diagnosis not present

## 2017-11-23 DIAGNOSIS — G0430 Acute necrotizing hemorrhagic encephalopathy, unspecified: Secondary | ICD-10-CM | POA: Diagnosis not present

## 2017-11-24 DIAGNOSIS — G0430 Acute necrotizing hemorrhagic encephalopathy, unspecified: Secondary | ICD-10-CM | POA: Diagnosis not present

## 2017-11-25 DIAGNOSIS — G0430 Acute necrotizing hemorrhagic encephalopathy, unspecified: Secondary | ICD-10-CM | POA: Diagnosis not present

## 2017-11-26 DIAGNOSIS — G0430 Acute necrotizing hemorrhagic encephalopathy, unspecified: Secondary | ICD-10-CM | POA: Diagnosis not present

## 2017-11-27 ENCOUNTER — Emergency Department (HOSPITAL_COMMUNITY): Payer: Medicare HMO

## 2017-11-27 ENCOUNTER — Other Ambulatory Visit: Payer: Self-pay

## 2017-11-27 ENCOUNTER — Inpatient Hospital Stay (HOSPITAL_COMMUNITY)
Admission: EM | Admit: 2017-11-27 | Discharge: 2017-12-01 | DRG: 689 | Disposition: A | Discharge status: Home / Self Care | Payer: Medicare HMO | Attending: Internal Medicine | Admitting: Internal Medicine

## 2017-11-27 ENCOUNTER — Encounter (HOSPITAL_COMMUNITY): Payer: Self-pay | Admitting: Emergency Medicine

## 2017-11-27 DIAGNOSIS — I129 Hypertensive chronic kidney disease with stage 1 through stage 4 chronic kidney disease, or unspecified chronic kidney disease: Secondary | ICD-10-CM | POA: Diagnosis present

## 2017-11-27 DIAGNOSIS — Z66 Do not resuscitate: Secondary | ICD-10-CM | POA: Diagnosis present

## 2017-11-27 DIAGNOSIS — R4020 Unspecified coma: Secondary | ICD-10-CM | POA: Diagnosis not present

## 2017-11-27 DIAGNOSIS — F419 Anxiety disorder, unspecified: Secondary | ICD-10-CM | POA: Diagnosis present

## 2017-11-27 DIAGNOSIS — B372 Candidiasis of skin and nail: Secondary | ICD-10-CM | POA: Diagnosis not present

## 2017-11-27 DIAGNOSIS — I10 Essential (primary) hypertension: Secondary | ICD-10-CM | POA: Diagnosis present

## 2017-11-27 DIAGNOSIS — Z885 Allergy status to narcotic agent status: Secondary | ICD-10-CM | POA: Diagnosis not present

## 2017-11-27 DIAGNOSIS — E039 Hypothyroidism, unspecified: Secondary | ICD-10-CM | POA: Diagnosis not present

## 2017-11-27 DIAGNOSIS — Z96641 Presence of right artificial hip joint: Secondary | ICD-10-CM | POA: Diagnosis present

## 2017-11-27 DIAGNOSIS — F0390 Unspecified dementia without behavioral disturbance: Secondary | ICD-10-CM | POA: Diagnosis present

## 2017-11-27 DIAGNOSIS — Z743 Need for continuous supervision: Secondary | ICD-10-CM | POA: Diagnosis not present

## 2017-11-27 DIAGNOSIS — R21 Rash and other nonspecific skin eruption: Secondary | ICD-10-CM | POA: Diagnosis present

## 2017-11-27 DIAGNOSIS — Z87891 Personal history of nicotine dependence: Secondary | ICD-10-CM | POA: Diagnosis not present

## 2017-11-27 DIAGNOSIS — Z8249 Family history of ischemic heart disease and other diseases of the circulatory system: Secondary | ICD-10-CM

## 2017-11-27 DIAGNOSIS — I4510 Unspecified right bundle-branch block: Secondary | ICD-10-CM | POA: Diagnosis not present

## 2017-11-27 DIAGNOSIS — Z9849 Cataract extraction status, unspecified eye: Secondary | ICD-10-CM

## 2017-11-27 DIAGNOSIS — G9341 Metabolic encephalopathy: Secondary | ICD-10-CM | POA: Diagnosis present

## 2017-11-27 DIAGNOSIS — N3000 Acute cystitis without hematuria: Secondary | ICD-10-CM

## 2017-11-27 DIAGNOSIS — G0430 Acute necrotizing hemorrhagic encephalopathy, unspecified: Secondary | ICD-10-CM | POA: Diagnosis not present

## 2017-11-27 DIAGNOSIS — Z7982 Long term (current) use of aspirin: Secondary | ICD-10-CM | POA: Diagnosis not present

## 2017-11-27 DIAGNOSIS — N390 Urinary tract infection, site not specified: Principal | ICD-10-CM | POA: Diagnosis present

## 2017-11-27 DIAGNOSIS — R109 Unspecified abdominal pain: Secondary | ICD-10-CM | POA: Diagnosis not present

## 2017-11-27 DIAGNOSIS — Z9049 Acquired absence of other specified parts of digestive tract: Secondary | ICD-10-CM

## 2017-11-27 DIAGNOSIS — Z86010 Personal history of colonic polyps: Secondary | ICD-10-CM

## 2017-11-27 DIAGNOSIS — R10819 Abdominal tenderness, unspecified site: Secondary | ICD-10-CM | POA: Diagnosis present

## 2017-11-27 DIAGNOSIS — Z79899 Other long term (current) drug therapy: Secondary | ICD-10-CM

## 2017-11-27 DIAGNOSIS — Z79890 Hormone replacement therapy (postmenopausal): Secondary | ICD-10-CM

## 2017-11-27 DIAGNOSIS — Z9089 Acquired absence of other organs: Secondary | ICD-10-CM | POA: Diagnosis not present

## 2017-11-27 DIAGNOSIS — I440 Atrioventricular block, first degree: Secondary | ICD-10-CM | POA: Diagnosis not present

## 2017-11-27 DIAGNOSIS — N189 Chronic kidney disease, unspecified: Secondary | ICD-10-CM | POA: Diagnosis present

## 2017-11-27 DIAGNOSIS — J45909 Unspecified asthma, uncomplicated: Secondary | ICD-10-CM | POA: Diagnosis present

## 2017-11-27 DIAGNOSIS — R404 Transient alteration of awareness: Secondary | ICD-10-CM

## 2017-11-27 DIAGNOSIS — F329 Major depressive disorder, single episode, unspecified: Secondary | ICD-10-CM | POA: Diagnosis not present

## 2017-11-27 DIAGNOSIS — B9689 Other specified bacterial agents as the cause of diseases classified elsewhere: Secondary | ICD-10-CM | POA: Diagnosis not present

## 2017-11-27 DIAGNOSIS — Z90710 Acquired absence of both cervix and uterus: Secondary | ICD-10-CM

## 2017-11-27 DIAGNOSIS — F32A Depression, unspecified: Secondary | ICD-10-CM | POA: Diagnosis present

## 2017-11-27 DIAGNOSIS — B86 Scabies: Secondary | ICD-10-CM | POA: Diagnosis present

## 2017-11-27 DIAGNOSIS — R10817 Generalized abdominal tenderness: Secondary | ICD-10-CM | POA: Diagnosis not present

## 2017-11-27 DIAGNOSIS — T7840XA Allergy, unspecified, initial encounter: Secondary | ICD-10-CM | POA: Diagnosis not present

## 2017-11-27 DIAGNOSIS — R4182 Altered mental status, unspecified: Secondary | ICD-10-CM | POA: Diagnosis present

## 2017-11-27 DIAGNOSIS — R279 Unspecified lack of coordination: Secondary | ICD-10-CM | POA: Diagnosis not present

## 2017-11-27 LAB — COMPREHENSIVE METABOLIC PANEL
ALK PHOS: 98 U/L (ref 38–126)
ALT: 27 U/L (ref 0–44)
ANION GAP: 8 (ref 5–15)
AST: 21 U/L (ref 15–41)
Albumin: 3.1 g/dL — ABNORMAL LOW (ref 3.5–5.0)
BUN: 18 mg/dL (ref 8–23)
CALCIUM: 9.5 mg/dL (ref 8.9–10.3)
CO2: 28 mmol/L (ref 22–32)
CREATININE: 0.77 mg/dL (ref 0.44–1.00)
Chloride: 107 mmol/L (ref 98–111)
GFR calc non Af Amer: >60 mL/min (ref >60)
Glucose, Bld: 116 mg/dL — ABNORMAL HIGH (ref 70–99)
Potassium: 4.2 mmol/L (ref 3.5–5.1)
SODIUM: 143 mmol/L (ref 135–145)
TOTAL PROTEIN: 6.7 g/dL (ref 6.5–8.1)
Total Bilirubin: 0.5 mg/dL (ref 0.3–1.2)

## 2017-11-27 LAB — RAPID URINE DRUG SCREEN, HOSP PERFORMED
Amphetamines: NOT DETECTED
BENZODIAZEPINES: NOT DETECTED
COCAINE: NOT DETECTED
Opiates: NOT DETECTED
TETRAHYDROCANNABINOL: NOT DETECTED

## 2017-11-27 LAB — URINALYSIS, ROUTINE W REFLEX MICROSCOPIC
BILIRUBIN URINE: NEGATIVE
GLUCOSE, UA: NEGATIVE mg/dL
Ketones, ur: 5 mg/dL — AB
LEUKOCYTES UA: NEGATIVE
Nitrite: NEGATIVE
PH: 5 (ref 5.0–8.0)
Protein, ur: NEGATIVE mg/dL
SPECIFIC GRAVITY, URINE: 1.014 (ref 1.005–1.030)

## 2017-11-27 LAB — CBC
HCT: 38 % (ref 36.0–46.0)
HEMOGLOBIN: 12.5 g/dL (ref 12.0–15.0)
MCH: 29.9 pg (ref 26.0–34.0)
MCHC: 32.9 g/dL (ref 30.0–36.0)
MCV: 90.9 fL (ref 78.0–100.0)
PLATELETS: 201 10*3/uL (ref 150–400)
RBC: 4.18 MIL/uL (ref 3.87–5.11)
RDW: 13.9 % (ref 11.5–15.5)
WBC: 11.8 10*3/uL — ABNORMAL HIGH (ref 4.0–10.5)

## 2017-11-27 LAB — CBG MONITORING, ED: Glucose-Capillary: 116 mg/dL — ABNORMAL HIGH (ref 70–99)

## 2017-11-27 LAB — ETHANOL

## 2017-11-27 LAB — T4, FREE: Free T4: 1.15 ng/dL (ref 0.82–1.77)

## 2017-11-27 LAB — TSH: TSH: 5.684 u[IU]/mL — ABNORMAL HIGH (ref 0.350–4.500)

## 2017-11-27 LAB — VALPROIC ACID LEVEL: Valproic Acid Lvl: 25 ug/mL — ABNORMAL LOW (ref 50.0–100.0)

## 2017-11-27 LAB — AMMONIA: AMMONIA: 31 umol/L (ref 9–35)

## 2017-11-27 MED ORDER — CEFTRIAXONE SODIUM 1 G IJ SOLR
1.0000 g | Freq: Once | INTRAMUSCULAR | Status: AC
  Administered 2017-11-27: 1 g via INTRAVENOUS
  Filled 2017-11-27: qty 10

## 2017-11-27 NOTE — ED Triage Notes (Signed)
[facility] (Wednesday)  Per EMS, called for allergic reaction however it appears to be skin breakdown under breast and on thighs.  While pt has hx of dementia, facility states that she is more altered than normal, pupils are pinpoint, urine smells strong.  Will not answer questions but repeats "my back hurts."

## 2017-11-27 NOTE — ED Notes (Signed)
Patient transported to CT 

## 2017-11-27 NOTE — ED Provider Notes (Signed)
[facility] EMERGENCY DEPARTMENT Provider Note   CSN: [id number] Arrival date & time: 11/27/17  2025     History   Chief Complaint Chief Complaint  Patient presents with  . Altered Mental Status  . Rash    HPI [name] is a 84 y.o. female.  HPI Level 5 caveat.  Patient coming from nursing home by EMS.  More lethargic than normal.  Unable to contribute to history.  Level 5 caveat applies.  Per EMS facility is being treated for acute scabies 2 days ago.  She also has rash noted under the right breast and in the groin area. Past Medical History:  Diagnosis Date  . Acute bronchitis 04/10/2013  . Anemia   . Angina   . Anxiety   . Arthritis   . Arthritis 10/02/2012  . Asthma   . Chicken pox   . Chronic kidney disease   . Colon polyps   . Dehydration 08/31/2013  . Dementia   . Depression   . Dysrhythmia   . Headache(784.0)   . Heart murmur   . Hypertension   . Hypothyroidism   . Left leg pain 10/02/2012  . Pneumonia   . Recurrent upper respiratory infection (URI)   . Shortness of breath   . UTI (urinary tract infection) 12/30/2012  . Valvular heart disease 09/04/2012    Patient Active Problem List   Diagnosis Date Noted  . Depression 11/27/2017  . Rash 11/27/2017  . Tibial fracture 09/16/2016  . Left fibular fracture 09/16/2016  . Leukocytosis 09/16/2016  . Pressure ulcer 09/20/2015  . Displaced fracture of right femoral neck (HCC) 09/19/2015  . Altered mental status   . Confusion 06/26/2015  . Fall   . Aspiration pneumonia (HCC) 01/10/2015  . Acute metabolic encephalopathy 01/08/2015  . UTI (urinary tract infection) 01/08/2015  . 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 Recurrent falls 11/27/2014  . FTT (failure to thrive) in adult 10/26/2014  . Dehydration 10/26/2014  . Abnormal urinalysis 10/26/2014  . Normocytic anemia 10/26/2014  . Lump of skin of left upper extremity 09/07/2014  . Rash and nonspecific skin eruption  05/08/2014  . Syncope and collapse 11/30/2013  . Low back pain, episodic 11/20/2013  . Chronic kidney disease   . Insomnia 08/31/2013  . Arthritis 10/02/2012  . Valvular heart disease 09/04/2012  . Restless leg syndrome 06/06/2012  . Trochanteric bursitis 08/02/2011  . Dementia 08/02/2011  . Leg pain 03/28/2011  . Hypertension 09/22/2010  . Hypothyroidism 09/22/2010    Past Surgical History:  Procedure Laterality Date  . ABDOMINAL HYSTERECTOMY    . APPENDECTOMY    . BACK SURGERY    . CHOLECYSTECTOMY    . COLONOSCOPY W/ POLYPECTOMY    . EYE SURGERY     cataract removed and eye lids lifted  . feet surgery    . FRACTURE SURGERY     bilateral arms  . HAND SURGERY    . HIP ARTHROPLASTY Right 09/20/2015   Procedure: ARTHROPLASTY RIGHT  BIPOLAR ANTERIOR HIP (HEMIARTHROPLASTY);  Surgeon: Samson Frederic, MD;  Location: [facility];  Service: Orthopedics;  Laterality: Right;  . kidney stones    . SHOULDER ARTHROSCOPY    . TONSILLECTOMY    . TONSILLECTOMY    . TUBAL LIGATION       OB History   None      Home Medications    Prior to Admission medications   Medication Sig Start Date End Date Taking? Authorizing Provider  acetaminophen (TYLENOL) 500 MG tablet Take 500 mg by mouth every 4 (four) hours as needed for mild pain, moderate pain, fever or headache.    Yes [provider]  alum & mag hydroxide-simeth (MINTOX) 200-200-20 MG/5ML suspension Take 30 mLs by mouth every 6 (six) hours as needed for indigestion or heartburn.    Yes [provider]  aspirin 81 MG chewable tablet Chew 81 mg daily by mouth.    Yes [provider]  clonazePAM (KLONOPIN) 0.5 MG tablet Take 1 tablet (0.5 mg total) by mouth 2 (two) times daily. Patient taking differently: Take 0.25 mg 2 (two) times daily by mouth.  09/23/15  Yes Hongalgi, Maximino GreenlandAnand D, MD  divalproex (DEPAKOTE SPRINKLE) 125 MG capsule Take 125 mg by mouth 3 (three) times daily.    Yes [provider]    escitalopram (LEXAPRO) 5 MG tablet Take 5 mg daily by mouth.    Yes [provider]  guaifenesin (ROBITUSSIN) 100 MG/5ML syrup Take 200 mg by mouth every 6 (six) hours as needed for cough.    Yes [provider]  levothyroxine (SYNTHROID, LEVOTHROID) 50 MCG tablet Take 50 mcg by mouth daily before breakfast.    Yes [provider]  loperamide (IMODIUM) 2 MG capsule Take 2 mg by mouth every 3 (three) hours as needed for diarrhea or loose stools.    Yes [provider]  magnesium hydroxide (MILK OF MAGNESIA) 400 MG/5ML suspension Take 30 mLs by mouth at bedtime as needed for mild constipation.   Yes [provider]  Melatonin 3 MG TABS Take 3 mg by mouth every evening.   Yes [provider]  Multiple Vitamins-Minerals (MULTIVITAMIN WITH MINERALS) tablet Take 1 tablet daily with breakfast by mouth. "Akorn/s antioxidant"   Yes [provider]  Neomycin-Bacitracin-Polymyxin (TRIPLE ANTIBIOTIC) [phone] OINT Apply 1 application 3 (three) times daily as needed topically (skin tears/abrasians).    Yes [provider]  Nutritional Supplements (NUTRITIONAL DRINK) LIQD Take 1 Bottle by mouth 3 (three) times daily with meals. *Mighty Shakes*   Yes [provider]  risperiDONE (RISPERDAL) 0.5 MG tablet Take 0.5 mg daily as needed by mouth (severe agitation/anxiety).  12/16/16  Yes [provider]  rivastigmine (EXELON) 1.5 MG capsule Take 1.5 mg by mouth 2 (two) times daily.   Yes [provider]  senna (SENOKOT) 8.6 MG TABS tablet Take 17.2 mg by mouth 2 (two) times daily.    Yes [provider]  traZODone (DESYREL) 50 MG tablet Take 50 mg by mouth at bedtime.    Yes [provider]  traMADol (ULTRAM) 50 MG tablet Take 1 tablet (50 mg total) by mouth every 6 (six) hours as needed for severe pain. Patient not taking: Reported on 11/27/2017 09/18/16   Edsel PetrinMikhail, Maryann, DO    Family  History Family History  Problem Relation Age of Onset  . Heart disease Mother   . Heart disease Father   . Heart disease Other   . Birth defects Other     Social History Social History   Tobacco Use  . Smoking status: Former Smoker    Packs/day: 0.20    Years: 1.00    Pack years: 0.20    Types: Cigarettes    Last attempt to quit: 06/02/1941    Years since quitting: 76.5  . Smokeless tobacco: Never Used  Substance Use Topics  . Alcohol use: No  . Drug use: No     Allergies   Morphine and  related; Codeine; and Hydrocodone   Review of Systems Review of Systems  Unable to perform ROS: Mental status change     Physical Exam Updated Vital Signs BP (!) 150/77   Pulse 79   Temp (!) 97 F (36.1 C) (Rectal)   Resp 13   SpO2 93%   Physical Exam  Constitutional: She appears well-developed and well-nourished.  HENT:  Head: Normocephalic and atraumatic.  Mouth/Throat: Oropharynx is clear and moist.  Eyes: EOM are normal.  Pinpoint pupils bilaterally  Neck: Normal range of motion. Neck supple.  No meningismus.  Cardiovascular: Normal rate and regular rhythm.  Pulmonary/Chest: Effort normal and breath sounds normal.  Abdominal: Soft. Bowel sounds are normal. There is no tenderness. There is no rebound and no guarding.  Musculoskeletal: Normal range of motion. She exhibits no edema or tenderness.  Neurological:  Drowsy.  Not answering questions or following commands.  Skin: Skin is warm and dry. Capillary refill takes less than 2 seconds. No rash noted. There is erythema.  Patient has intertriginous rash under the right breast and in the left inguinal fold consistent with tinea.  Erythematous plaque with satellite lesions.  Nursing note and vitals reviewed.    ED Treatments / Results  Labs (all labs ordered are listed, but only abnormal results are displayed) Labs Reviewed  COMPREHENSIVE METABOLIC PANEL - Abnormal; Notable for the following components:      Result  Value   Glucose, Bld 116 (*)    Albumin 3.1 (*)    All other components within normal limits  CBC - Abnormal; Notable for the following components:   WBC 11.8 (*)    All other components within normal limits  URINALYSIS, ROUTINE W REFLEX MICROSCOPIC - Abnormal; Notable for the following components:   APPearance HAZY (*)    Hgb urine dipstick LARGE (*)    Ketones, ur 5 (*)    Bacteria, UA FEW (*)    All other components within normal limits  RAPID URINE DRUG SCREEN, HOSP PERFORMED - Abnormal; Notable for the following components:   Barbiturates   (*)    Value: Result not available. Reagent lot number recalled by manufacturer.   All other components within normal limits  VALPROIC ACID LEVEL - Abnormal; Notable for the following components:   Valproic Acid Lvl 25 (*)    All other components within normal limits  TSH - Abnormal; Notable for the following components:   TSH 5.684 (*)    All other components within normal limits  CBG MONITORING, ED - Abnormal; Notable for the following components:   Glucose-Capillary 116 (*)    All other components within normal limits  URINE CULTURE  ETHANOL  AMMONIA  T4, FREE    EKG EKG Interpretation  Date/Time:  Friday November 27 2017 20:28:03 EDT Ventricular Rate:  75 PR Interval:    QRS Duration: 166 QT Interval:  432 QTC Calculation: 483 R Axis:   -62 Text Interpretation:  Sinus rhythm Prolonged PR interval RBBB and LAFB Confirmed by Loren Racer ([id number]) on 11/27/2017 11:49:53 PM   Radiology Ct Head Wo Contrast  Result Date: 11/27/2017 CLINICAL DATA:  Altered level of consciousness EXAM: CT HEAD WITHOUT CONTRAST TECHNIQUE: Contiguous axial images were obtained from the base of the skull through the vertex without intravenous contrast. COMPARISON:  04/13/2017 FINDINGS: Brain: Involutional changes the brain, likely age related with mild-to-moderate small vessel ischemic disease of periventricular and subcortical white matter. Right basal  ganglial and left caudate lacunar infarcts are noted. No  large vascular territory infarction, hemorrhage or midline shift. No intra-axial mass nor extra-axial fluid collections. Vascular: No hyperdense vessel or unexpected calcifications. Skull: No skull fracture or significant calvarial swelling. Sinuses/Orbits: Minimal mucosal thickening of the ethmoid and right maxillary sinuses. Bilateral lens replacements. Other: None IMPRESSION: Atrophy with chronic small vessel ischemia. No acute intracranial abnormality. Electronically Signed   By: Tollie Eth M.D.   On: 11/27/2017 23:03   Dg Chest Port 1 View  Result Date: 11/27/2017 CLINICAL DATA:  Allergic reaction. EXAM: PORTABLE CHEST 1 VIEW COMPARISON:  April 13, 2017 FINDINGS: The heart size and mediastinal contours are within normal limits. Both lungs are clear. The visualized skeletal structures are unremarkable. IMPRESSION: No active disease. Electronically Signed   By: Gerome Sam III M.D   On: 11/27/2017 21:06    Procedures Procedures (including critical care time)  Medications Ordered in ED Medications  cefTRIAXone (ROCEPHIN) 1 g in sodium chloride 0.9 % 100 mL IVPB (1 g Intravenous New Bag/Given 11/27/17 2342)     Initial Impression / Assessment and Plan / ED Course  I have reviewed the triage vital signs and the nursing notes.  Pertinent labs & imaging results that were available during my care of the patient were reviewed by me and considered in my medical decision making (see chart for details).     Noted to have oxygen saturations that dip down to the low 90s.  No difficulty breathing. X-ray without acute findings.  Question UTI UA.  Urine was sent for culture and will start on antibiotics.  CT head without acute findings.  Continues to be lethargic which is likely multifactorial in etiology.  Discussed with hospitalist who will see patient in the emergency department and admit. Final Clinical Impressions(s) / ED Diagnoses    Final diagnoses:  Transient alteration of awareness  Intertriginous candidiasis  Acute lower UTI    ED Discharge Orders    None       Loren Racer, MD 11/27/17 2350

## 2017-11-27 NOTE — H&P (Signed)
History and Physical    [name] ZOX:[id number] DOB: Oct 18, 1920 DOA: 11/27/2017  Referring MD/NP/PA:   PCP: Ron Parker, MD   Patient coming from:  The patient is coming from SNF.  At baseline, pt is dependent for most of ADL.   Chief Complaint: AMS and abdominal tenderness  HPI: [name] is a 84 y.o. female with medical history significant of hypertension, asthma, hypothyroidism, dementia, depression, anxiety, anemia, urinary tract infection, who presents with altered mental status and abdominal tenderness.  Patient has AMS, and is unable to provide accurate medical history, therefore, most of the history is obtained by discussing the case with ED physician, per EMS report, and with the nursing staff.   Per report, pt has dementia, but becomes more confused than her baseline. He is very lethargic.  No acute respiratory distress, shortness of breath or cough noted.  No active nausea vomiting, diarrhea notated.  She moves all extremities. Per report, she is being treated for scabies (Wednesday) in facility. In ED. she is found to have intertriginous rash under the right breast and in the left inguinal fold, seems to be intertriginous candidiasis.  On my examination, patient seems to have central abdominal tenderness on palpation.   ED Course: pt was found to have WBC 11.8, negative UDS, ammonia 31, valproic acid level 25 with this is subtherapeutic, alcohol less than 10, urinalysis with 10-20 WBC, few bacteria, hazy appearance.  Electrolytes renal function okay, temperature 97, no tachycardia, oxygen sat 92 to 94% on room air, negative chest x-ray, negative CT head of acute intracranial abnormalities.  Patient is admitted to telemetry bed as inpatient.  Review of Systems: Could not be reviewed due to altered mental status and dementia.  Allergy:  Allergies  Allergen Reactions  . Morphine And Related Other (See Comments)    Unknown reaction  . Codeine Rash  . Hydrocodone Rash     Past Medical History:  Diagnosis Date  . Acute bronchitis 04/10/2013  . Anemia   . Angina   . Anxiety   . Arthritis   . Arthritis 10/02/2012  . Asthma   . Chicken pox   . Chronic kidney disease   . Colon polyps   . Dehydration 08/31/2013  . Dementia   . Depression   . Dysrhythmia   . Headache(784.0)   . Heart murmur   . Hypertension   . Hypothyroidism   . Left leg pain 10/02/2012  . Pneumonia   . Recurrent upper respiratory infection (URI)   . Shortness of breath   . UTI (urinary tract infection) 12/30/2012  . Valvular heart disease 09/04/2012    Past Surgical History:  Procedure Laterality Date  . ABDOMINAL HYSTERECTOMY    . APPENDECTOMY    . BACK SURGERY    . CHOLECYSTECTOMY    . COLONOSCOPY W/ POLYPECTOMY    . EYE SURGERY     cataract removed and eye lids lifted  . feet surgery    . FRACTURE SURGERY     bilateral arms  . HAND SURGERY    . HIP ARTHROPLASTY Right 09/20/2015   Procedure: ARTHROPLASTY RIGHT  BIPOLAR ANTERIOR HIP (HEMIARTHROPLASTY);  Surgeon: Samson Frederic, MD;  Location: [facility];  Service: Orthopedics;  Laterality: Right;  . kidney stones    . SHOULDER ARTHROSCOPY    . TONSILLECTOMY    . TONSILLECTOMY    . TUBAL LIGATION      Social History:  reports that she quit smoking about 76 years ago. Her smoking  use included cigarettes. She has a 0.20 pack-year smoking history. She has never used smokeless tobacco. She reports that she does not drink alcohol or use drugs.  Family History:  Family History  Problem Relation Age of Onset  . Heart disease Mother   . Heart disease Father   . Heart disease Other   . Birth defects Other      Prior to Admission medications   Medication Sig Start Date End Date Taking? Authorizing Provider  acetaminophen (TYLENOL) 500 MG tablet Take 500 mg by mouth every 4 (four) hours as needed for mild pain, moderate pain, fever or headache.    Yes [provider]  alum & mag hydroxide-simeth (MINTOX) 200-200-20  MG/5ML suspension Take 30 mLs by mouth every 6 (six) hours as needed for indigestion or heartburn.    Yes [provider]  aspirin 81 MG chewable tablet Chew 81 mg daily by mouth.    Yes [provider]  clonazePAM (KLONOPIN) 0.5 MG tablet Take 1 tablet (0.5 mg total) by mouth 2 (two) times daily. Patient taking differently: Take 0.25 mg 2 (two) times daily by mouth.  09/23/15  Yes Hongalgi, Maximino Greenland, MD  divalproex (DEPAKOTE SPRINKLE) 125 MG capsule Take 125 mg by mouth 3 (three) times daily.    Yes [provider]  escitalopram (LEXAPRO) 5 MG tablet Take 5 mg daily by mouth.    Yes [provider]  guaifenesin (ROBITUSSIN) 100 MG/5ML syrup Take 200 mg by mouth every 6 (six) hours as needed for cough.    Yes [provider]  levothyroxine (SYNTHROID, LEVOTHROID) 50 MCG tablet Take 50 mcg by mouth daily before breakfast.    Yes [provider]  loperamide (IMODIUM) 2 MG capsule Take 2 mg by mouth every 3 (three) hours as needed for diarrhea or loose stools.    Yes [provider]  magnesium hydroxide (MILK OF MAGNESIA) 400 MG/5ML suspension Take 30 mLs by mouth at bedtime as needed for mild constipation.   Yes [provider]  Melatonin 3 MG TABS Take 3 mg by mouth every evening.   Yes [provider]  Multiple Vitamins-Minerals (MULTIVITAMIN WITH MINERALS) tablet Take 1 tablet daily with breakfast by mouth. "Akorn/s antioxidant"   Yes [provider]  Neomycin-Bacitracin-Polymyxin (TRIPLE ANTIBIOTIC) [phone] OINT Apply 1 application 3 (three) times daily as needed topically (skin tears/abrasians).    Yes [provider]  Nutritional Supplements (NUTRITIONAL DRINK) LIQD Take 1 Bottle by mouth 3 (three) times daily with meals. *Mighty Shakes*   Yes [provider]  risperiDONE (RISPERDAL) 0.5 MG tablet Take 0.5 mg daily as needed by mouth (severe agitation/anxiety).  12/16/16  Yes [provider]  rivastigmine (EXELON) 1.5 MG capsule Take 1.5 mg by mouth 2 (two) times daily.   Yes [provider]  senna (SENOKOT) 8.6 MG TABS tablet Take 17.2 mg by mouth 2 (two) times daily.    Yes [provider]  traZODone (DESYREL) 50 MG tablet Take 50 mg by mouth at bedtime.    Yes [provider]  traMADol (ULTRAM) 50 MG tablet Take 1 tablet (50 mg total) by mouth every 6 (six) hours as needed for severe pain. Patient not taking: Reported on 11/27/2017 09/18/16   Edsel Petrin, DO    Physical Exam: Vitals:   11/28/17 0015 11/28/17 0045 11/28/17 0115 11/28/17 0250  BP: (!) 150/57 (!) 158/56 (!) 152/59 (!) 180/87  Pulse: 79 80 83 78  Resp: 20 17 (!)  22 17  Temp:    98.2 F (36.8 C)  TempSrc:    Axillary  SpO2: 94% 93% 95% 95%  Weight:    87.1 kg (192 lb 0.3 oz)   General: Not in acute distress. HEENT:       Eyes: PERRL, EOMI, no scleral icterus. Pinpoint pupils bilaterally        ENT: No discharge from the ears and nose, no pharynx injection, no tonsillar enlargement.        Neck: No JVD, no bruit, no mass felt. Heme: No neck lymph node enlargement. Cardiac: S1/S2, RRR, No murmurs, No gallops or rubs. Respiratory: No rales, wheezing, rhonchi or rubs. GI: nondistended, has tenderness in central abdomen, no organomegaly, BS present. GU: No hematuria Ext: No pitting leg edema bilaterally. 2+DP/PT pulse bilaterally. Musculoskeletal: No joint deformities, No joint redness or warmth, no limitation of ROM in spin. Skin: has itertriginous rash under the right breast and in the left inguinal fold, with red plaque with satellite lesions.  Neuro: confused, not riented X3, cranial nerves II-XII grossly intact, moves all extremities normally Psych: Patient is not psychotic.  Labs on Admission: I have personally reviewed following labs and imaging studies  CBC: Recent Labs  Lab 11/27/17 2058 11/28/17 0258  WBC 11.8* 14.0*  HGB 12.5 12.7  HCT 38.0  38.8  MCV 90.9 90.9  PLT 201 201   Basic Metabolic Panel: Recent Labs  Lab 11/27/17 2058 11/28/17 0258  NA 143 144  K 4.2 4.4  CL 107 107  CO2 28 30  GLUCOSE 116* 121*  BUN 18 17  CREATININE 0.77 0.82  CALCIUM 9.5 9.7   GFR: CrCl cannot be calculated (Unknown ideal weight.). Liver Function Tests: Recent Labs  Lab 11/27/17 2058  AST 21  ALT 27  ALKPHOS 98  BILITOT 0.5  PROT 6.7  ALBUMIN 3.1*   Recent Labs  Lab 11/28/17 0258  LIPASE 29   Recent Labs  Lab 11/27/17 2109  AMMONIA 31   Coagulation Profile: No results for input(s): INR, PROTIME in the last 168 hours. Cardiac Enzymes: No results for input(s): CKTOTAL, CKMB, CKMBINDEX, TROPONINI in the last 168 hours. BNP (last 3 results) No results for input(s): PROBNP in the last 8760 hours. HbA1C: No results for input(s): HGBA1C in the last 72 hours. CBG: Recent Labs  Lab 11/27/17 2039  GLUCAP 116*   Lipid Profile: No results for input(s): CHOL, HDL, LDLCALC, TRIG, CHOLHDL, LDLDIRECT in the last 72 hours. Thyroid Function Tests: Recent Labs    11/27/17 2109  TSH 5.684*  FREET4 1.15   Anemia Panel: No results for input(s): VITAMINB12, FOLATE, FERRITIN, TIBC, IRON, RETICCTPCT in the last 72 hours. Urine analysis:    Component Value Date/Time   COLORURINE YELLOW 11/27/2017 2146   APPEARANCEUR HAZY (A) 11/27/2017 2146   LABSPEC 1.014 11/27/2017 2146   PHURINE 5.0 11/27/2017 2146   GLUCOSEU NEGATIVE 11/27/2017 2146   GLUCOSEU NEGATIVE 04/06/2014 1127   HGBUR LARGE (A) 11/27/2017 2146   BILIRUBINUR NEGATIVE 11/27/2017 2146   BILIRUBINUR small 03/14/2014 1151   KETONESUR 5 (A) 11/27/2017 2146   PROTEINUR NEGATIVE 11/27/2017 2146   UROBILINOGEN 0.2 03/23/2015 0053   NITRITE NEGATIVE 11/27/2017 2146   LEUKOCYTESUR NEGATIVE 11/27/2017 2146   Sepsis Labs: @LABRCNTIP (procalcitonin:4,lacticidven:4) )No results found for this or any previous visit (from the past 240 hour(s)).   Radiological Exams on  Admission: Ct Head Wo Contrast  Result Date: 11/27/2017 CLINICAL DATA:  Altered level of consciousness EXAM: CT HEAD WITHOUT  CONTRAST TECHNIQUE: Contiguous axial images were obtained from the base of the skull through the vertex without intravenous contrast. COMPARISON:  04/13/2017 FINDINGS: Brain: Involutional changes the brain, likely age related with mild-to-moderate small vessel ischemic disease of periventricular and subcortical white matter. Right basal ganglial and left caudate lacunar infarcts are noted. No large vascular territory infarction, hemorrhage or midline shift. No intra-axial mass nor extra-axial fluid collections. Vascular: No hyperdense vessel or unexpected calcifications. Skull: No skull fracture or significant calvarial swelling. Sinuses/Orbits: Minimal mucosal thickening of the ethmoid and right maxillary sinuses. Bilateral lens replacements. Other: None IMPRESSION: Atrophy with chronic small vessel ischemia. No acute intracranial abnormality. Electronically Signed   By: Tollie Eth M.D.   On: 11/27/2017 23:03   Dg Chest Port 1 View  Result Date: 11/27/2017 CLINICAL DATA:  Allergic reaction. EXAM: PORTABLE CHEST 1 VIEW COMPARISON:  April 13, 2017 FINDINGS: The heart size and mediastinal contours are within normal limits. Both lungs are clear. The visualized skeletal structures are unremarkable. IMPRESSION: No active disease. Electronically Signed   By: Gerome Sam III M.D   On: 11/27/2017 21:06     EKG: Independently reviewed.  Sinus rhythm, QTC 483, bifascicular block which is old.  Assessment/Plan Principal Problem:   Acute metabolic encephalopathy Active Problems:   Hypertension   Hypothyroidism   Dementia   UTI (urinary tract infection)   Depression   Rash   Abdominal tenderness   Intertriginous candidiasis  Acute metabolic encephalopathy: Etiology is not clear.  CT head is negative for acute intracranial abnormalities.  Patient moves all extremities, does  not seem to have stroke.  Urinalysis showed 10-20 WBC, few bacteria, not very convincing for UTI, but possible since patient has mild leukocytosis with WBC 11.8.  -Admit to telemetry bed as inpatient - Start Rocephin empirically for possible UTI -Blood culture, urine culture -Frequent neuro check - keep patient n.p.o. until mental status improves to avoid aspiration -Hold all oral medications until mental status improves  Abdominal tenderness: Etiology is not clear.  Lipase normal, 29. -Follow-up CT abdomen/pelvis  Hypertension  -IV hydralazine as needed -hold oral meds  Hypothyroidism: Last TSH was 5.684 which is increased, T4 normal, 1.15 -Continue home Synthroid, switch oral to IV, cultures from 50- 25 mcg daily  Dementia: -Hold home medications until mental status improves  UTI (urinary tract infection): -on rocephin as above  Rash likely due to intertriginous candidiasis -Nystatin ointment  Depression and anxiety:  -Hold home oral medications - Switch Depakote to IV   DVT ppx: SQ Lovenox Code Status: DNR (patient has documented from SNF facility indicating patient is DNR) Family Communication: None at bed side.      Disposition Plan:  Anticipate discharge back to previous SNF Consults called:  none Admission status:   Inpatient/tele    Date of Service 11/28/2017    Lorretta Harp [facility] Pager [phone]  If 7PM-7AM, please contact night-coverage [email or web address] Password [facility] 11/28/2017, 6:51 AM

## 2017-11-28 ENCOUNTER — Inpatient Hospital Stay (HOSPITAL_COMMUNITY): Payer: Medicare HMO

## 2017-11-28 ENCOUNTER — Encounter (HOSPITAL_COMMUNITY): Payer: Self-pay | Admitting: Radiology

## 2017-11-28 DIAGNOSIS — Z90710 Acquired absence of both cervix and uterus: Secondary | ICD-10-CM | POA: Diagnosis not present

## 2017-11-28 DIAGNOSIS — R404 Transient alteration of awareness: Secondary | ICD-10-CM

## 2017-11-28 DIAGNOSIS — Z885 Allergy status to narcotic agent status: Secondary | ICD-10-CM | POA: Diagnosis not present

## 2017-11-28 DIAGNOSIS — N189 Chronic kidney disease, unspecified: Secondary | ICD-10-CM | POA: Diagnosis present

## 2017-11-28 DIAGNOSIS — G9341 Metabolic encephalopathy: Secondary | ICD-10-CM | POA: Diagnosis present

## 2017-11-28 DIAGNOSIS — F419 Anxiety disorder, unspecified: Secondary | ICD-10-CM | POA: Diagnosis present

## 2017-11-28 DIAGNOSIS — F0390 Unspecified dementia without behavioral disturbance: Secondary | ICD-10-CM | POA: Diagnosis present

## 2017-11-28 DIAGNOSIS — Z87891 Personal history of nicotine dependence: Secondary | ICD-10-CM | POA: Diagnosis not present

## 2017-11-28 DIAGNOSIS — R10819 Abdominal tenderness, unspecified site: Secondary | ICD-10-CM | POA: Diagnosis present

## 2017-11-28 DIAGNOSIS — Z9849 Cataract extraction status, unspecified eye: Secondary | ICD-10-CM | POA: Diagnosis not present

## 2017-11-28 DIAGNOSIS — R10817 Generalized abdominal tenderness: Secondary | ICD-10-CM | POA: Diagnosis not present

## 2017-11-28 DIAGNOSIS — B9689 Other specified bacterial agents as the cause of diseases classified elsewhere: Secondary | ICD-10-CM | POA: Diagnosis present

## 2017-11-28 DIAGNOSIS — B86 Scabies: Secondary | ICD-10-CM | POA: Diagnosis present

## 2017-11-28 DIAGNOSIS — Z66 Do not resuscitate: Secondary | ICD-10-CM | POA: Diagnosis present

## 2017-11-28 DIAGNOSIS — E039 Hypothyroidism, unspecified: Secondary | ICD-10-CM

## 2017-11-28 DIAGNOSIS — B372 Candidiasis of skin and nail: Secondary | ICD-10-CM | POA: Diagnosis present

## 2017-11-28 DIAGNOSIS — R4182 Altered mental status, unspecified: Secondary | ICD-10-CM | POA: Diagnosis present

## 2017-11-28 DIAGNOSIS — I129 Hypertensive chronic kidney disease with stage 1 through stage 4 chronic kidney disease, or unspecified chronic kidney disease: Secondary | ICD-10-CM | POA: Diagnosis present

## 2017-11-28 DIAGNOSIS — Z8249 Family history of ischemic heart disease and other diseases of the circulatory system: Secondary | ICD-10-CM | POA: Diagnosis not present

## 2017-11-28 DIAGNOSIS — Z9049 Acquired absence of other specified parts of digestive tract: Secondary | ICD-10-CM | POA: Diagnosis not present

## 2017-11-28 DIAGNOSIS — Z86010 Personal history of colonic polyps: Secondary | ICD-10-CM | POA: Diagnosis not present

## 2017-11-28 DIAGNOSIS — F329 Major depressive disorder, single episode, unspecified: Secondary | ICD-10-CM | POA: Diagnosis present

## 2017-11-28 DIAGNOSIS — N390 Urinary tract infection, site not specified: Secondary | ICD-10-CM | POA: Diagnosis present

## 2017-11-28 DIAGNOSIS — Z7982 Long term (current) use of aspirin: Secondary | ICD-10-CM | POA: Diagnosis not present

## 2017-11-28 DIAGNOSIS — Z79899 Other long term (current) drug therapy: Secondary | ICD-10-CM | POA: Diagnosis not present

## 2017-11-28 DIAGNOSIS — Z9089 Acquired absence of other organs: Secondary | ICD-10-CM | POA: Diagnosis not present

## 2017-11-28 DIAGNOSIS — Z96641 Presence of right artificial hip joint: Secondary | ICD-10-CM | POA: Diagnosis present

## 2017-11-28 DIAGNOSIS — J45909 Unspecified asthma, uncomplicated: Secondary | ICD-10-CM | POA: Diagnosis present

## 2017-11-28 LAB — CBC
HCT: 38.8 % (ref 36.0–46.0)
Hemoglobin: 12.7 g/dL (ref 12.0–15.0)
MCH: 29.7 pg (ref 26.0–34.0)
MCHC: 32.7 g/dL (ref 30.0–36.0)
MCV: 90.9 fL (ref 78.0–100.0)
PLATELETS: 201 10*3/uL (ref 150–400)
RBC: 4.27 MIL/uL (ref 3.87–5.11)
RDW: 13.8 % (ref 11.5–15.5)
WBC: 14 10*3/uL — AB (ref 4.0–10.5)

## 2017-11-28 LAB — BASIC METABOLIC PANEL
Anion gap: 7 (ref 5–15)
BUN: 17 mg/dL (ref 8–23)
CO2: 30 mmol/L (ref 22–32)
CREATININE: 0.82 mg/dL (ref 0.44–1.00)
Calcium: 9.7 mg/dL (ref 8.9–10.3)
Chloride: 107 mmol/L (ref 98–111)
GFR, EST NON AFRICAN AMERICAN: 59 mL/min — AB (ref >60)
Glucose, Bld: 121 mg/dL — ABNORMAL HIGH (ref 70–99)
POTASSIUM: 4.4 mmol/L (ref 3.5–5.1)
SODIUM: 144 mmol/L (ref 135–145)

## 2017-11-28 LAB — GLUCOSE, CAPILLARY: GLUCOSE-CAPILLARY: 110 mg/dL — AB (ref 70–99)

## 2017-11-28 LAB — LIPASE, BLOOD: Lipase: 29 U/L (ref 11–51)

## 2017-11-28 MED ORDER — RIVASTIGMINE TARTRATE 1.5 MG PO CAPS
1.5000 mg | ORAL_CAPSULE | Freq: Two times a day (BID) | ORAL | Status: DC
  Administered 2017-11-28 – 2017-12-01 (×7): 1.5 mg via ORAL
  Filled 2017-11-28 (×8): qty 1

## 2017-11-28 MED ORDER — IOHEXOL 300 MG/ML  SOLN
100.0000 mL | Freq: Once | INTRAMUSCULAR | Status: AC | PRN
  Administered 2017-11-28: 100 mL via INTRAVENOUS

## 2017-11-28 MED ORDER — SODIUM CHLORIDE 0.9 % IV SOLN
1.0000 g | INTRAVENOUS | Status: DC
  Administered 2017-11-28 – 2017-12-01 (×4): 1 g via INTRAVENOUS
  Filled 2017-11-28 (×5): qty 10

## 2017-11-28 MED ORDER — NYSTATIN 100000 UNIT/GM EX CREA
TOPICAL_CREAM | Freq: Two times a day (BID) | CUTANEOUS | Status: DC
  Administered 2017-11-28 – 2017-11-29 (×4): via TOPICAL
  Administered 2017-11-29: 1 via TOPICAL
  Administered 2017-11-30 – 2017-12-01 (×3): via TOPICAL
  Filled 2017-11-28: qty 15

## 2017-11-28 MED ORDER — ONDANSETRON HCL 4 MG/2ML IJ SOLN: 4.0000 mg | Freq: Three times a day (TID) | INTRAMUSCULAR | Status: DC | PRN

## 2017-11-28 MED ORDER — ESCITALOPRAM OXALATE 10 MG PO TABS
5.0000 mg | ORAL_TABLET | Freq: Every day | ORAL | Status: DC
  Administered 2017-11-28 – 2017-12-01 (×4): 5 mg via ORAL
  Filled 2017-11-28 (×4): qty 1

## 2017-11-28 MED ORDER — ACETAMINOPHEN 650 MG RE SUPP
650.0000 mg | Freq: Four times a day (QID) | RECTAL | Status: DC | PRN
  Administered 2017-11-28: 650 mg via RECTAL
  Filled 2017-11-28: qty 1

## 2017-11-28 MED ORDER — HYDRALAZINE HCL 20 MG/ML IJ SOLN: 5.0000 mg | INTRAMUSCULAR | Status: DC | PRN

## 2017-11-28 MED ORDER — SODIUM CHLORIDE 0.9 % IV SOLN
INTRAVENOUS | Status: AC
  Administered 2017-11-28: 50 mL/h via INTRAVENOUS

## 2017-11-28 MED ORDER — SODIUM CHLORIDE 0.9 % IV SOLN: 1.0000 g | Freq: Once | INTRAVENOUS | Status: DC

## 2017-11-28 MED ORDER — SODIUM CHLORIDE 0.9 % IV SOLN
INTRAVENOUS | Status: DC
  Administered 2017-11-28: 04:00:00 via INTRAVENOUS

## 2017-11-28 MED ORDER — ENOXAPARIN SODIUM 40 MG/0.4ML ~~LOC~~ SOLN
40.0000 mg | SUBCUTANEOUS | Status: DC
  Administered 2017-11-28 – 2017-12-01 (×4): 40 mg via SUBCUTANEOUS
  Filled 2017-11-28 (×4): qty 0.4

## 2017-11-28 MED ORDER — LEVOTHYROXINE SODIUM 75 MCG PO TABS
75.0000 ug | ORAL_TABLET | Freq: Every day | ORAL | Status: DC
Start: 2017-11-29 — End: 2017-12-01
  Administered 2017-11-29 – 2017-12-01 (×3): 75 ug via ORAL
  Filled 2017-11-28 (×3): qty 1

## 2017-11-28 MED ORDER — LEVOTHYROXINE SODIUM 100 MCG IV SOLR
25.0000 ug | Freq: Every day | INTRAVENOUS | Status: DC
  Administered 2017-11-28: 25 ug via INTRAVENOUS
  Filled 2017-11-28: qty 5

## 2017-11-28 MED ORDER — CLONAZEPAM 0.5 MG PO TABS
0.2500 mg | ORAL_TABLET | Freq: Every day | ORAL | Status: DC
  Administered 2017-11-29 – 2017-11-30 (×3): 0.25 mg via ORAL
  Filled 2017-11-28 (×3): qty 1

## 2017-11-28 MED ORDER — VALPROATE SODIUM 500 MG/5ML IV SOLN
125.0000 mg | Freq: Three times a day (TID) | INTRAVENOUS | Status: DC
  Administered 2017-11-28 (×2): 125 mg via INTRAVENOUS
  Filled 2017-11-28 (×3): qty 1.25

## 2017-11-28 NOTE — Progress Notes (Signed)
PROGRESS NOTE    [name]  ION:[id number] DOB: 30-Nov-1920 DOA: 11/27/2017 PCP: Ron Parker, MD  Brief Narrative: [name] is a 84 y.o. female with medical history significant of hypertension, asthma, hypothyroidism, dementia, depression, anxiety, anemia, urinary tract infection, resident of [facility] presented to the ED with altered mental status and abdominal tenderness.  Assessment & Plan:   Principal Problem:   Acute metabolic encephalopathy -suspect secondary to urinary infection in the background of advanced dementia -improving, she is alert awake, oriented to self and partially to person now, able to answer some of my questions appropriately -CT head notable for atrophy -LFTs, ammonia level unremarkable -TSH is mildly elevated, will increase Synthroid dose -Reportedly had some abdominal tenderness on evaluation yesterday, CT abdomen pelvis ordered by me was unremarkable -SOP evaluation, start diet after swallow evaluation complete    Dementia -at baseline on Klonopin-0.25 mg twice a day, Depakote-125 mg 3 times a day-suspect due to behavioral/mood problems in the setting of dementia -Exelon 1.5 mg twice a day and risperidone 0.5 mg daily PRN for agitation -will restart Exelon and Klonopin as daily at bedtime    Possible UTI (urinary tract infection) -history unreliable, in the setting of confusion, leukocytosis and abnormal urinalysis -Continue ceftriaxone, follow up urine cultures    Depression -restart Lexapro 5 mg daily    Hypothyroidism -TSH is abnormal, increase Synthroid dose to 75 g  DVT prophylaxis:Lovenox Code Status: DO NOT RESUSCITATE Family Communication:no family at bedside Disposition Plan: back to SNF pending clinical improvement  Consultants:      Procedures:   Antimicrobials:    Subjective: -denies any complaints, some confusion overnight, no fevers, no nausea or vomiting  Objective: Vitals:   11/28/17 0045 11/28/17  0115 11/28/17 0250 11/28/17 1132  BP: (!) 158/56 (!) 152/59 (!) 180/87 (!) 170/84  Pulse: 80 83 78 66  Resp: 17 (!) 22 17   Temp:   98.2 F (36.8 C)   TempSrc:   Axillary   SpO2: 93% 95% 95% 96%  Weight:   87.1 kg (192 lb 0.3 oz)    No intake or output data in the 24 hours ending 11/28/17 1137 Filed Weights   11/28/17 0250  Weight: 87.1 kg (192 lb 0.3 oz)    Examination:  General exam: Appears calm and comfortable, pleasant elderly female, laying in bed, no distress Respiratory system: decreased breath sounds at bases, rest clear Cardiovascular system: S1 & S2 heard, RRR\ Gastrointestinal system: Abdomen is nondistended, soft and nontender.Normal bowel sounds heard. Central nervous system: Alert and oriented, to self and partly to place only Extremities: no edema, right leg mobility limited chronically due to pain Skin: No rashes, lesions or ulcers Psychiatry: mood & affect appropriate.     Data Reviewed:   CBC: Recent Labs  Lab 11/27/17 2058 11/28/17 0258  WBC 11.8* 14.0*  HGB 12.5 12.7  HCT 38.0 38.8  MCV 90.9 90.9  PLT 201 201   Basic Metabolic Panel: Recent Labs  Lab 11/27/17 2058 11/28/17 0258  NA 143 144  K 4.2 4.4  CL 107 107  CO2 28 30  GLUCOSE 116* 121*  BUN 18 17  CREATININE 0.77 0.82  CALCIUM 9.5 9.7   GFR: CrCl cannot be calculated (Unknown ideal weight.). Liver Function Tests: Recent Labs  Lab 11/27/17 2058  AST 21  ALT 27  ALKPHOS 98  BILITOT 0.5  PROT 6.7  ALBUMIN 3.1*   Recent Labs  Lab 11/28/17 0258  LIPASE 29  Recent Labs  Lab 11/27/17 2109  AMMONIA 31   Coagulation Profile: No results for input(s): INR, PROTIME in the last 168 hours. Cardiac Enzymes: No results for input(s): CKTOTAL, CKMB, CKMBINDEX, TROPONINI in the last 168 hours. BNP (last 3 results) No results for input(s): PROBNP in the last 8760 hours. HbA1C: No results for input(s): HGBA1C in the last 72 hours. CBG: Recent Labs  Lab 11/27/17 2039    GLUCAP 116*   Lipid Profile: No results for input(s): CHOL, HDL, LDLCALC, TRIG, CHOLHDL, LDLDIRECT in the last 72 hours. Thyroid Function Tests: Recent Labs    11/27/17 2109  TSH 5.684*  FREET4 1.15   Anemia Panel: No results for input(s): VITAMINB12, FOLATE, FERRITIN, TIBC, IRON, RETICCTPCT in the last 72 hours. Urine analysis:    Component Value Date/Time   COLORURINE YELLOW 11/27/2017 2146   APPEARANCEUR HAZY (A) 11/27/2017 2146   LABSPEC 1.014 11/27/2017 2146   PHURINE 5.0 11/27/2017 2146   GLUCOSEU NEGATIVE 11/27/2017 2146   GLUCOSEU NEGATIVE 04/06/2014 1127   HGBUR LARGE (A) 11/27/2017 2146   BILIRUBINUR NEGATIVE 11/27/2017 2146   BILIRUBINUR small 03/14/2014 1151   KETONESUR 5 (A) 11/27/2017 2146   PROTEINUR NEGATIVE 11/27/2017 2146   UROBILINOGEN 0.2 03/23/2015 0053   NITRITE NEGATIVE 11/27/2017 2146   LEUKOCYTESUR NEGATIVE 11/27/2017 2146   Sepsis Labs: @LABRCNTIP (procalcitonin:4,lacticidven:4)  )No results found for this or any previous visit (from the past 240 hour(s)).       Radiology Studies: Ct Head Wo Contrast  Result Date: 11/27/2017 CLINICAL DATA:  Altered level of consciousness EXAM: CT HEAD WITHOUT CONTRAST TECHNIQUE: Contiguous axial images were obtained from the base of the skull through the vertex without intravenous contrast. COMPARISON:  04/13/2017 FINDINGS: Brain: Involutional changes the brain, likely age related with mild-to-moderate small vessel ischemic disease of periventricular and subcortical white matter. Right basal ganglial and left caudate lacunar infarcts are noted. No large vascular territory infarction, hemorrhage or midline shift. No intra-axial mass nor extra-axial fluid collections. Vascular: No hyperdense vessel or unexpected calcifications. Skull: No skull fracture or significant calvarial swelling. Sinuses/Orbits: Minimal mucosal thickening of the ethmoid and right maxillary sinuses. Bilateral lens replacements. Other: None  IMPRESSION: Atrophy with chronic small vessel ischemia. No acute intracranial abnormality. Electronically Signed   By: Tollie Eth M.D.   On: 11/27/2017 23:03   Ct Abdomen Pelvis W Contrast  Result Date: 11/28/2017 CLINICAL DATA:  Acute abdominal pain, generalized EXAM: CT ABDOMEN AND PELVIS WITH CONTRAST TECHNIQUE: Multidetector CT imaging of the abdomen and pelvis was performed using the standard protocol following bolus administration of intravenous contrast. CONTRAST:  OMNIPAQUE IOHEXOL 300 MG/ML  SOLN COMPARISON:  None. FINDINGS: Lower chest: Streaky opacity at the right base consistent with mild atelectasis. Hepatobiliary: Tiny low-density in the central right liver is too small for densitometry.Cholecystectomy. Reservoir effect and age accounts for bile duct prominence. Pancreas: Generalized atrophy Spleen: Unremarkable. Adrenals/Urinary Tract: Negative adrenals. Branching left lower renal calculus. Asymmetric left renal atrophy. Renal sinus cysts on the right. No hydronephrosis. Moderately distended bladder without wall thickening Stomach/Bowel: No obstruction. No inflammatory changes. Lipomatosis hypertrophy at the ileocecal valve. Vascular/Lymphatic: Atherosclerosis. No acute finding. no mass or adenopathy. Reproductive:Hysterectomy Other: No ascites or pneumoperitoneum. Musculoskeletal: Right hip hemiarthroplasty. Disc and facet degeneration with L4-5 anterolisthesis. L3-4 and L4-5 remote laminectomy. No acute osseous finding. IMPRESSION: 1. No acute finding. 2. Moderately distended urinary bladder, please ensure no urinary retention. 3. Branching left renal calculus. 4.  Aortic Atherosclerosis (ICD10-I70.0). Electronically Signed   By:  Marnee Spring M.D.   On: 11/28/2017 08:21   Dg Chest Port 1 View  Result Date: 11/27/2017 CLINICAL DATA:  Allergic reaction. EXAM: PORTABLE CHEST 1 VIEW COMPARISON:  April 13, 2017 FINDINGS: The heart size and mediastinal contours are within normal  limits. Both lungs are clear. The visualized skeletal structures are unremarkable. IMPRESSION: No active disease. Electronically Signed   By: Gerome Sam III M.D   On: 11/27/2017 21:06        Scheduled Meds: . enoxaparin (LOVENOX) injection  40 mg Subcutaneous Q24H  . levothyroxine  25 mcg Intravenous Daily  . nystatin cream   Topical BID   Continuous Infusions: . sodium chloride 75 mL/hr at 11/28/17 0414  . cefTRIAXone (ROCEPHIN)  IV 1 g (11/28/17 1121)  . valproate sodium 125 mg (11/28/17 0644)     LOS: 0 days    Time spent:    Zannie Cove, MD [facility] Page via [email or web address], password [email or web address] After 7PM please contact night-coverage  11/28/2017, 11:37 AM

## 2017-11-28 NOTE — Evaluation (Signed)
Clinical/Bedside Swallow Evaluation Patient Details  Name: [name] MRN: [id number] Date of Birth: January 13, 1921  Today's Date: 11/28/2017 Time: SLP Start Time (ACUTE ONLY): 1046 SLP Stop Time (ACUTE ONLY): 1055 SLP Time Calculation (min) (ACUTE ONLY): 9 min  Past Medical History:  Past Medical History:  Diagnosis Date  . Acute bronchitis 04/10/2013  . Anemia   . Angina   . Anxiety   . Arthritis   . Arthritis 10/02/2012  . Asthma   . Chicken pox   . Chronic kidney disease   . Colon polyps   . Dehydration 08/31/2013  . Dementia   . Depression   . Dysrhythmia   . Headache(784.0)   . Heart murmur   . Hypertension   . Hypothyroidism   . Left leg pain 10/02/2012  . Pneumonia   . Recurrent upper respiratory infection (URI)   . Shortness of breath   . UTI (urinary tract infection) 12/30/2012  . Valvular heart disease 09/04/2012   Past Surgical History:  Past Surgical History:  Procedure Laterality Date  . ABDOMINAL HYSTERECTOMY    . APPENDECTOMY    . BACK SURGERY    . CHOLECYSTECTOMY    . COLONOSCOPY W/ POLYPECTOMY    . EYE SURGERY     cataract removed and eye lids lifted  . feet surgery    . FRACTURE SURGERY     bilateral arms  . HAND SURGERY    . HIP ARTHROPLASTY Right 09/20/2015   Procedure: ARTHROPLASTY RIGHT  BIPOLAR ANTERIOR HIP (HEMIARTHROPLASTY);  Surgeon: Samson FredericBrian Swinteck, MD;  Location: [facility];  Service: Orthopedics;  Laterality: Right;  . kidney stones    . SHOULDER ARTHROSCOPY    . TONSILLECTOMY    . TONSILLECTOMY    . TUBAL LIGATION     HPI:  [name] is a 84 y.o. female with medical history significant of hypertension, asthma, hypothyroidism, dementia, depression, anxiety, anemia, urinary tract infection, who presents with altered mental status and abdominal tenderness.  Negative chest x-ray, negative CT head of acute intracranial abnormalities. Admitted with acute metabolic encephalopathy. Seen by SLP during prior admissions with findings of functional  oropharyngeal swallow, most recently 01/12/15 with soft diet, thin liquids recommended.    Assessment / Plan / Recommendation Clinical Impression   Patient's mentation is greatest risk for aspiration. She is alert, but confused, does not follow commands. Accepts limited POs, expectorating ice chips, refusing all but 2 sips of thin liquids via cup sip, with SLP assistance. Brief oral holding, though swallow appears timely with adequate airway protection. Recommend she remain NPO, but allow sips of clear liquids after oral care, crushed meds in puree. Prognosis good with improved ability to sustain attention to POs. Will follow up next date.    SLP Visit Diagnosis: Dysphagia, unspecified (R13.10)    Aspiration Risk  Moderate aspiration risk    Diet Recommendation Ice chips PRN after oral care;Free water protocol after oral care;NPO except meds   Liquid Administration via: Cup Medication Administration: Crushed with puree Supervision: Full supervision/cueing for compensatory strategies Compensations: Slow rate;Small sips/bites;Minimize environmental distractions Postural Changes: Seated upright at 90 degrees    Other  Recommendations Oral Care Recommendations: Oral care QID;Oral care prior to ice chip/H20   Follow up Recommendations Skilled Nursing facility      Frequency and Duration min 2x/week  2 weeks       Prognosis Prognosis for Safe Diet Advancement: Good Barriers to Reach Goals: Cognitive deficits      Swallow Study  General Date of Onset: 11/27/17 HPI: [name] is a 84 y.o. female with medical history significant of hypertension, asthma, hypothyroidism, dementia, depression, anxiety, anemia, urinary tract infection, who presents with altered mental status and abdominal tenderness.  Negative chest x-ray, negative CT head of acute intracranial abnormalities. Admitted with acute metabolic encephalopathy. Seen by SLP during prior admissions with findings of functional  oropharyngeal swallow, most recently 01/12/15 with soft diet, thin liquids recommended.  Type of Study: Bedside Swallow Evaluation Previous Swallow Assessment: see hpi Diet Prior to this Study: NPO Temperature Spikes Noted: No Respiratory Status: Room air History of Recent Intubation: No Behavior/Cognition: Alert;Confused;Doesn't follow directions Oral Cavity Assessment: Within Functional Limits Oral Care Completed by SLP: No Oral Cavity - Dentition: Poor condition Self-Feeding Abilities: Total assist Patient Positioning: Upright in bed Baseline Vocal Quality: Normal Volitional Cough: Cognitively unable to elicit Volitional Swallow: Unable to elicit    Oral/Motor/Sensory Function Overall Oral Motor/Sensory Function: Other (comment)(Pt not following commands)   Ice Chips Ice chips: Impaired Presentation: Spoon Oral Phase Impairments: Poor awareness of bolus Oral Phase Functional Implications: Other (comment)(pt expectorated)   Thin Liquid Thin Liquid: Within functional limits Presentation: Cup    Nectar Thick Nectar Thick Liquid: Not tested   Honey Thick Honey Thick Liquid: Not tested   Puree Puree: Not tested(pt refused)   Solid   GO   Solid: Not tested       Deborah Baton, MS, CCC-SLP Speech-Language Pathologist [phone]  Arlana Lindau 11/28/2017,11:44 AM

## 2017-11-28 NOTE — ED Notes (Signed)
Pt brought back to ED from CT, which was unable to obtain b/c she would not stop moving.  Attempted several times but unsuccessful.

## 2017-11-29 LAB — BASIC METABOLIC PANEL
Anion gap: 10 (ref 5–15)
BUN: 11 mg/dL (ref 8–23)
CHLORIDE: 104 mmol/L (ref 98–111)
CO2: 27 mmol/L (ref 22–32)
Calcium: 9 mg/dL (ref 8.9–10.3)
Creatinine, Ser: 0.79 mg/dL (ref 0.44–1.00)
GFR calc Af Amer: >60 mL/min (ref >60)
GFR calc non Af Amer: >60 mL/min (ref >60)
GLUCOSE: 96 mg/dL (ref 70–99)
POTASSIUM: 3.9 mmol/L (ref 3.5–5.1)
Sodium: 141 mmol/L (ref 135–145)

## 2017-11-29 LAB — CBC
HEMATOCRIT: 36.1 % (ref 36.0–46.0)
HEMOGLOBIN: 11.6 g/dL — AB (ref 12.0–15.0)
MCH: 29.1 pg (ref 26.0–34.0)
MCHC: 32.1 g/dL (ref 30.0–36.0)
MCV: 90.7 fL (ref 78.0–100.0)
Platelets: 197 10*3/uL (ref 150–400)
RBC: 3.98 MIL/uL (ref 3.87–5.11)
RDW: 13.8 % (ref 11.5–15.5)
WBC: 10.7 10*3/uL — ABNORMAL HIGH (ref 4.0–10.5)

## 2017-11-29 MED ORDER — DEXTROSE-NACL 5-0.45 % IV SOLN
INTRAVENOUS | Status: AC
  Administered 2017-11-29: 10:00:00 via INTRAVENOUS

## 2017-11-29 NOTE — Progress Notes (Signed)
PROGRESS NOTE    [name]  ZOX:[id number] DOB: 29-Jan-1921 DOA: 11/27/2017 PCP: Ron ParkerBowen, [name], MD  Brief Narrative: [name] is a 84 y.o. female with medical history significant of hypertension, asthma, hypothyroidism, dementia, depression, anxiety, anemia, urinary tract infection, resident of [facility] presented to the ED with lethargy  Assessment & Plan:   Principal Problem:   Acute metabolic encephalopathy -suspect secondary to urinary infection in the background of advanced dementia -improving, she is alert awake, oriented to self and partially to person now, but pleasantly confused  -CT head notable for atrophy -LFTs, ammonia level unremarkable -TSH is mildly elevated, increased Synthroid dose -Reportedly had some abdominal tenderness in ED, CT abdomen pelvis was unremarkable -SLP evaluation, start diet after swallow evaluation complete -gentle IVF while NPO    Dementia -at baseline on Klonopin-0.25 mg twice a day, Depakote-125 mg 3 times a day-suspect due to behavioral/mood problems in the setting of dementia -Exelon 1.5 mg twice a day and risperidone 0.5 mg daily PRN for agitation -restart Exelon and Klonopin as daily at bedtime    Possible UTI (urinary tract infection) -history unreliable, in the setting of confusion, leukocytosis and abnormal urinalysis -Continue ceftriaxone, follow up urine cultures-reintubated    Depression -continue Lexapro 5 mg daily    Hypothyroidism -TSH is abnormal, increased Synthroid dose to 75 g  DVT prophylaxis:Lovenox Code Status: DO NOT RESUSCITATE Family Communication:no family at bedside Disposition Plan: back to SNF pending clinical improvement, at least 48 hours  Consultants:      Procedures:   Antimicrobials:    Subjective: -pleasantly confused, no other issues overnight  Objective: Vitals:   11/28/17 2049 11/29/17 0025 11/29/17 0513 11/29/17 0854  BP: (!) 151/99 (!) 183/89 (!) 160/88 (!) 152/71    Pulse: 84 93 81 70  Resp: 20 20 18 20   Temp: 97.7 F (36.5 C)  98 F (36.7 C) 98.3 F (36.8 C)  TempSrc: Oral  Oral Oral  SpO2: 96% 96% 95% 97%  Weight:        Intake/Output Summary (Last 24 hours) at 11/29/2017 1153 Last data filed at 11/29/2017 0420 Gross per 24 hour  Intake 570 ml  Output 1400 ml  Net -830 ml   Filed Weights   11/28/17 0250  Weight: 87.1 kg (192 lb 0.3 oz)    Examination:  Gen: awake, alert, elderly female pleasantly confused in bed, no distress HEENT: PERRLA, Neck supple, no JVD Lungs: decreased breath sounds at both bases CVS: S1S 2/regular rate rhythm Abd: soft, Non tender, non distended, BS present Extremities: No edema Skin: no new rashes Psychiatry: mood & affect appropriate.     Data Reviewed:   CBC: Recent Labs  Lab 11/27/17 2058 11/28/17 0258 11/29/17 0610  WBC 11.8* 14.0* 10.7*  HGB 12.5 12.7 11.6*  HCT 38.0 38.8 36.1  MCV 90.9 90.9 90.7  PLT 201 201 197   Basic Metabolic Panel: Recent Labs  Lab 11/27/17 2058 11/28/17 0258 11/29/17 0610  NA 143 144 141  K 4.2 4.4 3.9  CL 107 107 104  CO2 28 30 27   GLUCOSE 116* 121* 96  BUN 18 17 11   CREATININE 0.77 0.82 0.79  CALCIUM 9.5 9.7 9.0   GFR: CrCl cannot be calculated (Unknown ideal weight.). Liver Function Tests: Recent Labs  Lab 11/27/17 2058  AST 21  ALT 27  ALKPHOS 98  BILITOT 0.5  PROT 6.7  ALBUMIN 3.1*   Recent Labs  Lab 11/28/17 0258  LIPASE 29  Recent Labs  Lab 11/27/17 2109  AMMONIA 31   Coagulation Profile: No results for input(s): INR, PROTIME in the last 168 hours. Cardiac Enzymes: No results for input(s): CKTOTAL, CKMB, CKMBINDEX, TROPONINI in the last 168 hours. BNP (last 3 results) No results for input(s): PROBNP in the last 8760 hours. HbA1C: No results for input(s): HGBA1C in the last 72 hours. CBG: Recent Labs  Lab 11/27/17 2039 11/28/17 2227  GLUCAP 116* 110*   Lipid Profile: No results for input(s): CHOL, HDL, LDLCALC,  TRIG, CHOLHDL, LDLDIRECT in the last 72 hours. Thyroid Function Tests: Recent Labs    11/27/17 2109  TSH 5.684*  FREET4 1.15   Anemia Panel: No results for input(s): VITAMINB12, FOLATE, FERRITIN, TIBC, IRON, RETICCTPCT in the last 72 hours. Urine analysis:    Component Value Date/Time   COLORURINE YELLOW 11/27/2017 2146   APPEARANCEUR HAZY (A) 11/27/2017 2146   LABSPEC 1.014 11/27/2017 2146   PHURINE 5.0 11/27/2017 2146   GLUCOSEU NEGATIVE 11/27/2017 2146   GLUCOSEU NEGATIVE 04/06/2014 1127   HGBUR LARGE (A) 11/27/2017 2146   BILIRUBINUR NEGATIVE 11/27/2017 2146   BILIRUBINUR small 03/14/2014 1151   KETONESUR 5 (A) 11/27/2017 2146   PROTEINUR NEGATIVE 11/27/2017 2146   UROBILINOGEN 0.2 03/23/2015 0053   NITRITE NEGATIVE 11/27/2017 2146   LEUKOCYTESUR NEGATIVE 11/27/2017 2146   Sepsis Labs: @LABRCNTIP (procalcitonin:4,lacticidven:4)  ) Recent Results (from the past 240 hour(s))  Urine culture     Status: None (Preliminary result)   Collection Time: 11/27/17  9:45 PM  Result Value Ref Range Status   Specimen Description URINE, CATHETERIZED  Final   Special Requests NONE  Final   Culture   Final    CULTURE REINCUBATED FOR BETTER GROWTH Performed at [facility], [address] [place], [place] [address]    Report Status PENDING  Incomplete         Radiology Studies: Ct Head Wo Contrast  Result Date: 11/27/2017 CLINICAL DATA:  Altered level of consciousness EXAM: CT HEAD WITHOUT CONTRAST TECHNIQUE: Contiguous axial images were obtained from the base of the skull through the vertex without intravenous contrast. COMPARISON:  04/13/2017 FINDINGS: Brain: Involutional changes the brain, likely age related with mild-to-moderate small vessel ischemic disease of periventricular and subcortical white matter. Right basal ganglial and left caudate lacunar infarcts are noted. No large vascular territory infarction, hemorrhage or midline shift. No intra-axial mass nor  extra-axial fluid collections. Vascular: No hyperdense vessel or unexpected calcifications. Skull: No skull fracture or significant calvarial swelling. Sinuses/Orbits: Minimal mucosal thickening of the ethmoid and right maxillary sinuses. Bilateral lens replacements. Other: None IMPRESSION: Atrophy with chronic small vessel ischemia. No acute intracranial abnormality. Electronically Signed   By: Tollie Eth M.D.   On: 11/27/2017 23:03   Ct Abdomen Pelvis W Contrast  Result Date: 11/28/2017 CLINICAL DATA:  Acute abdominal pain, generalized EXAM: CT ABDOMEN AND PELVIS WITH CONTRAST TECHNIQUE: Multidetector CT imaging of the abdomen and pelvis was performed using the standard protocol following bolus administration of intravenous contrast. CONTRAST:  OMNIPAQUE IOHEXOL 300 MG/ML  SOLN COMPARISON:  None. FINDINGS: Lower chest: Streaky opacity at the right base consistent with mild atelectasis. Hepatobiliary: Tiny low-density in the central right liver is too small for densitometry.Cholecystectomy. Reservoir effect and age accounts for bile duct prominence. Pancreas: Generalized atrophy Spleen: Unremarkable. Adrenals/Urinary Tract: Negative adrenals. Branching left lower renal calculus. Asymmetric left renal atrophy. Renal sinus cysts on the right. No hydronephrosis. Moderately distended bladder without wall thickening Stomach/Bowel: No obstruction. No  inflammatory changes. Lipomatosis hypertrophy at the ileocecal valve. Vascular/Lymphatic: Atherosclerosis. No acute finding. no mass or adenopathy. Reproductive:Hysterectomy Other: No ascites or pneumoperitoneum. Musculoskeletal: Right hip hemiarthroplasty. Disc and facet degeneration with L4-5 anterolisthesis. L3-4 and L4-5 remote laminectomy. No acute osseous finding. IMPRESSION: 1. No acute finding. 2. Moderately distended urinary bladder, please ensure no urinary retention. 3. Branching left renal calculus. 4.  Aortic Atherosclerosis (ICD10-I70.0).  Electronically Signed   By: Marnee Spring M.D.   On: 11/28/2017 08:21   Dg Chest Port 1 View  Result Date: 11/27/2017 CLINICAL DATA:  Allergic reaction. EXAM: PORTABLE CHEST 1 VIEW COMPARISON:  April 13, 2017 FINDINGS: The heart size and mediastinal contours are within normal limits. Both lungs are clear. The visualized skeletal structures are unremarkable. IMPRESSION: No active disease. Electronically Signed   By: Gerome Sam III M.D   On: 11/27/2017 21:06        Scheduled Meds: . clonazePAM  0.25 mg Oral QHS  . enoxaparin (LOVENOX) injection  40 mg Subcutaneous Q24H  . escitalopram  5 mg Oral Daily  . levothyroxine  75 mcg Oral QAC breakfast  . nystatin cream   Topical BID  . rivastigmine  1.5 mg Oral BID   Continuous Infusions: . cefTRIAXone (ROCEPHIN)  IV 1 g (11/29/17 0952)  . dextrose 5 % and 0.45% NaCl 50 mL/hr at 11/29/17 0954     LOS: 1 day    Time spent:    Zannie Cove, MD [facility] Page via [email or web address], password [email or web address] After 7PM please contact night-coverage  11/29/2017, 11:53 AM

## 2017-11-29 NOTE — Progress Notes (Signed)
Speech Language Pathology Treatment: Dysphagia  Patient Details Name: [name] MRN: [id number] DOB: [birth year] Today's Date: 11/29/2017 Time: 0730-0750 SLP Time Calculation (min) (ACUTE ONLY): 20 min  Assessment / Plan / Recommendation Clinical Impression  SLP followed up for dysphagia. Pt's mentation is slightly improved, unsure of how close to baseline. Alert with cues, asking repetitively to get up. Pt required max assist to self-feed cup and straw sips of liquids, solids, and frequent redirection/cues for sustained attention to PO trials. There is consistent, immediate wet coughing with all POs with the exception of purees, and multiple swallows with liquids, concerning for decreased airway protection. Suspect delayed swallow initiation. No documented history of dysphagia, and pt's lungs clear on admission per CXR, however symptoms at bedside concerning for oropharygneal dysphagia. No family present; SLP reached son via phone for further discussion to direct treatment planning. SLP discussed swallowing problems which can occur as dementia progresses, and role of instrumental studies in determining swallow function. Also discussed diet modifications and role these might play in context of quality of life at pt's advanced age. I recommend continuing NPO for now, with medications whole in puree and ice chips PRN after oral care for comfort. SLP will follow up next date for improvements in tolerance of POs and for possible MBS should this have an impact on course of treatment.      HPI HPI: [name] is a 84 y.o. female with medical history significant of hypertension, asthma, hypothyroidism, dementia, depression, anxiety, anemia, urinary tract infection, who presents with altered mental status and abdominal tenderness.  Negative chest x-ray, negative CT head of acute intracranial abnormalities. Admitted with acute metabolic encephalopathy. Seen by SLP during prior admissions with findings of  functional oropharyngeal swallow, most recently 01/12/15 with soft diet, thin liquids recommended.       SLP Plan  Continue with current plan of care;MBS(consider MBS next date)       Recommendations  Diet recommendations: NPO;Other(comment)(ice chips after oral care) Medication Administration: Crushed with puree Compensations: Slow rate;Small sips/bites;Minimize environmental distractions                Oral Care Recommendations: Oral care QID;Oral care prior to ice chip/H20 Follow up Recommendations: Skilled Nursing facility SLP Visit Diagnosis: Dysphagia, unspecified (R13.10) Plan: Continue with current plan of care;MBS(consider MBS next date)       GO              Rondel Baton, MS, CCC-SLP Speech-Language Pathologist [phone]   Arlana Lindau 11/29/2017, 8:57 AM

## 2017-11-30 LAB — BASIC METABOLIC PANEL
Anion gap: 7 (ref 5–15)
BUN: 13 mg/dL (ref 8–23)
CHLORIDE: 106 mmol/L (ref 98–111)
CO2: 26 mmol/L (ref 22–32)
Calcium: 9 mg/dL (ref 8.9–10.3)
Creatinine, Ser: 0.86 mg/dL (ref 0.44–1.00)
GFR, EST NON AFRICAN AMERICAN: 56 mL/min — AB (ref >60)
Glucose, Bld: 101 mg/dL — ABNORMAL HIGH (ref 70–99)
POTASSIUM: 3.9 mmol/L (ref 3.5–5.1)
Sodium: 139 mmol/L (ref 135–145)

## 2017-11-30 LAB — URINE CULTURE

## 2017-11-30 LAB — CBC
HCT: 37.3 % (ref 36.0–46.0)
HEMOGLOBIN: 12.1 g/dL (ref 12.0–15.0)
MCH: 29.4 pg (ref 26.0–34.0)
MCHC: 32.4 g/dL (ref 30.0–36.0)
MCV: 90.5 fL (ref 78.0–100.0)
Platelets: 192 10*3/uL (ref 150–400)
RBC: 4.12 MIL/uL (ref 3.87–5.11)
RDW: 13.7 % (ref 11.5–15.5)
WBC: 10.8 10*3/uL — AB (ref 4.0–10.5)

## 2017-11-30 IMAGING — CR DG KNEE COMPLETE 4+V*R*
4 series · 4 of 4 positions shown · non-contrast
Comparison: Right knee radiographs performed 04/17/2016

CLINICAL DATA: Acute onset of right knee pain. Found on floor.
Initial encounter.

EXAM:
RIGHT KNEE - COMPLETE 4+ VIEW

[t knee obl right (1 of 2)]
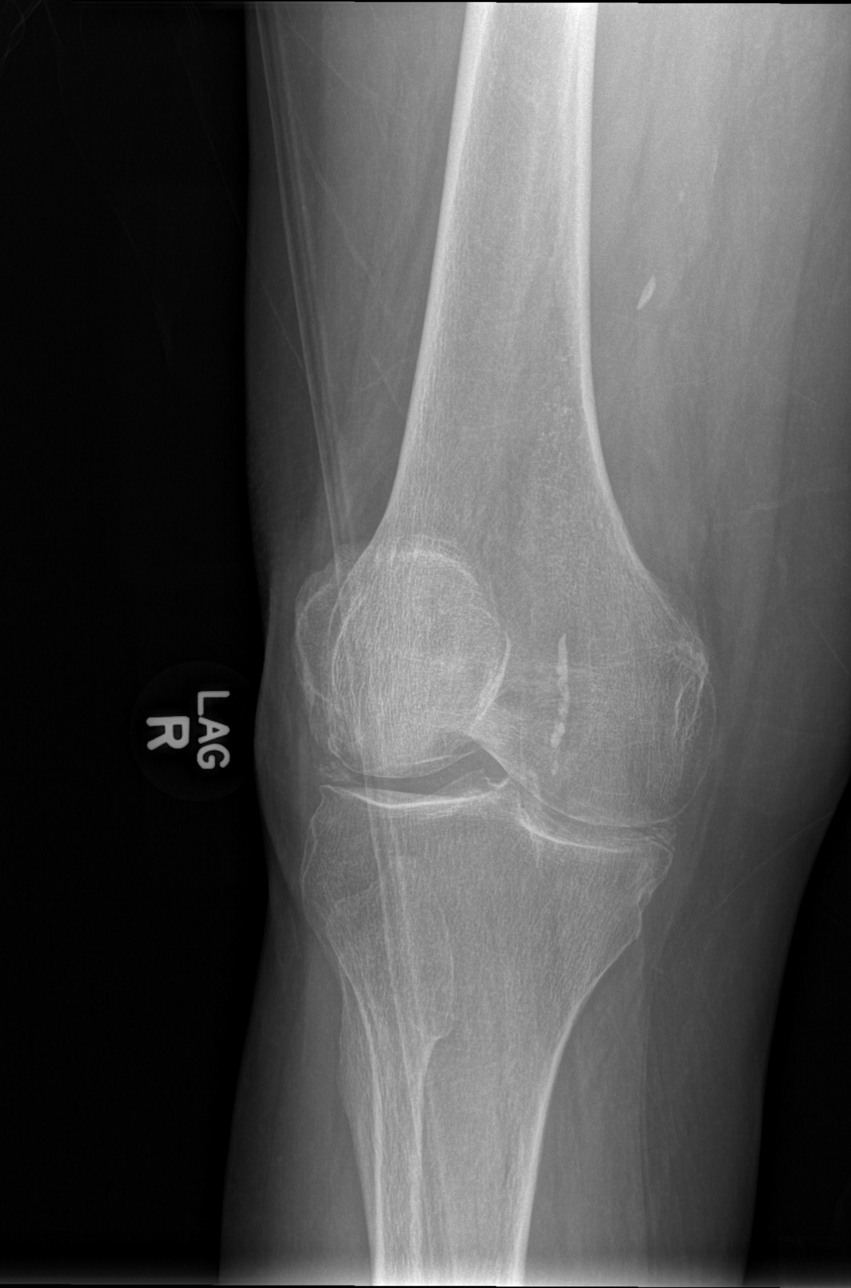

[t knee ap right]
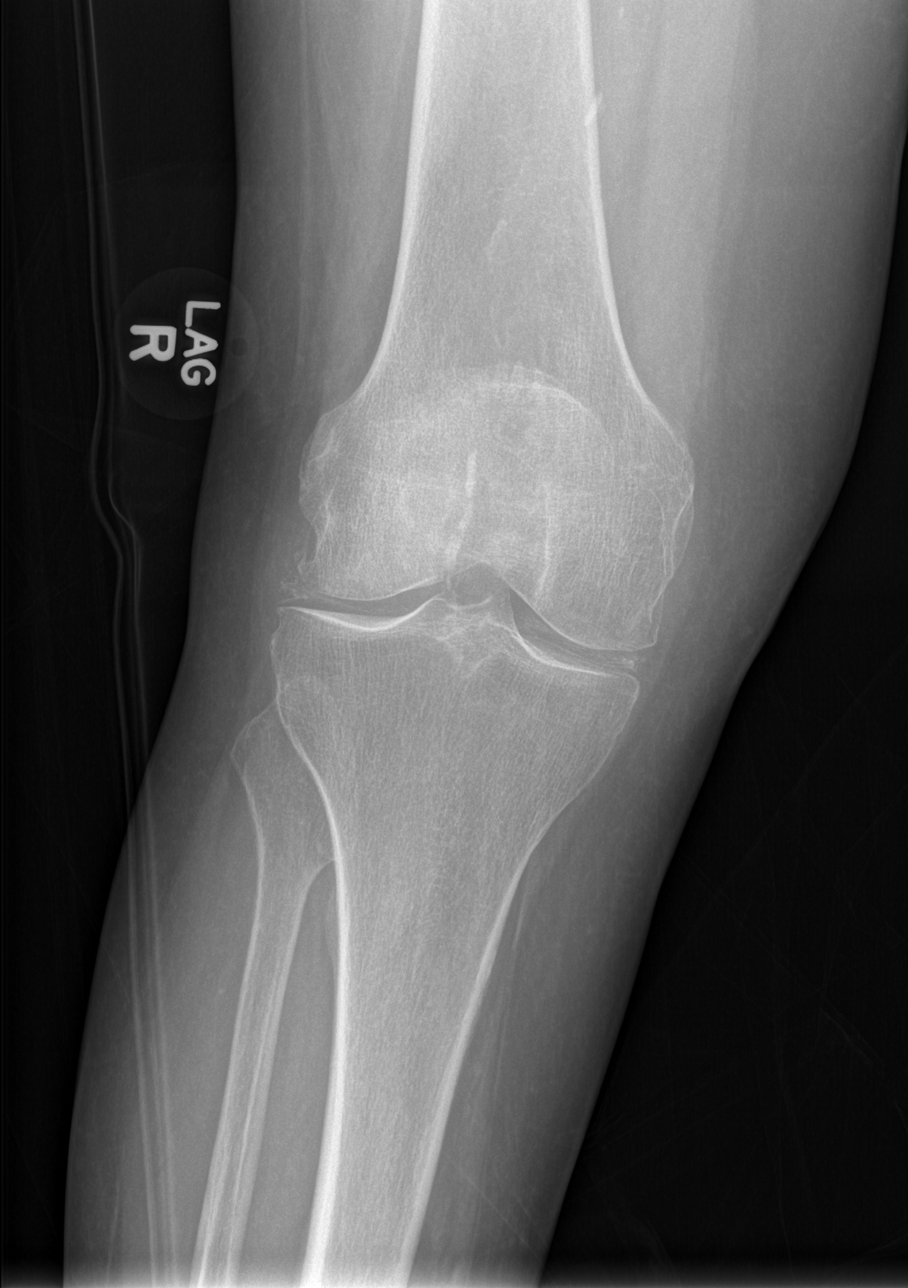

[t knee obl right (2 of 2)]
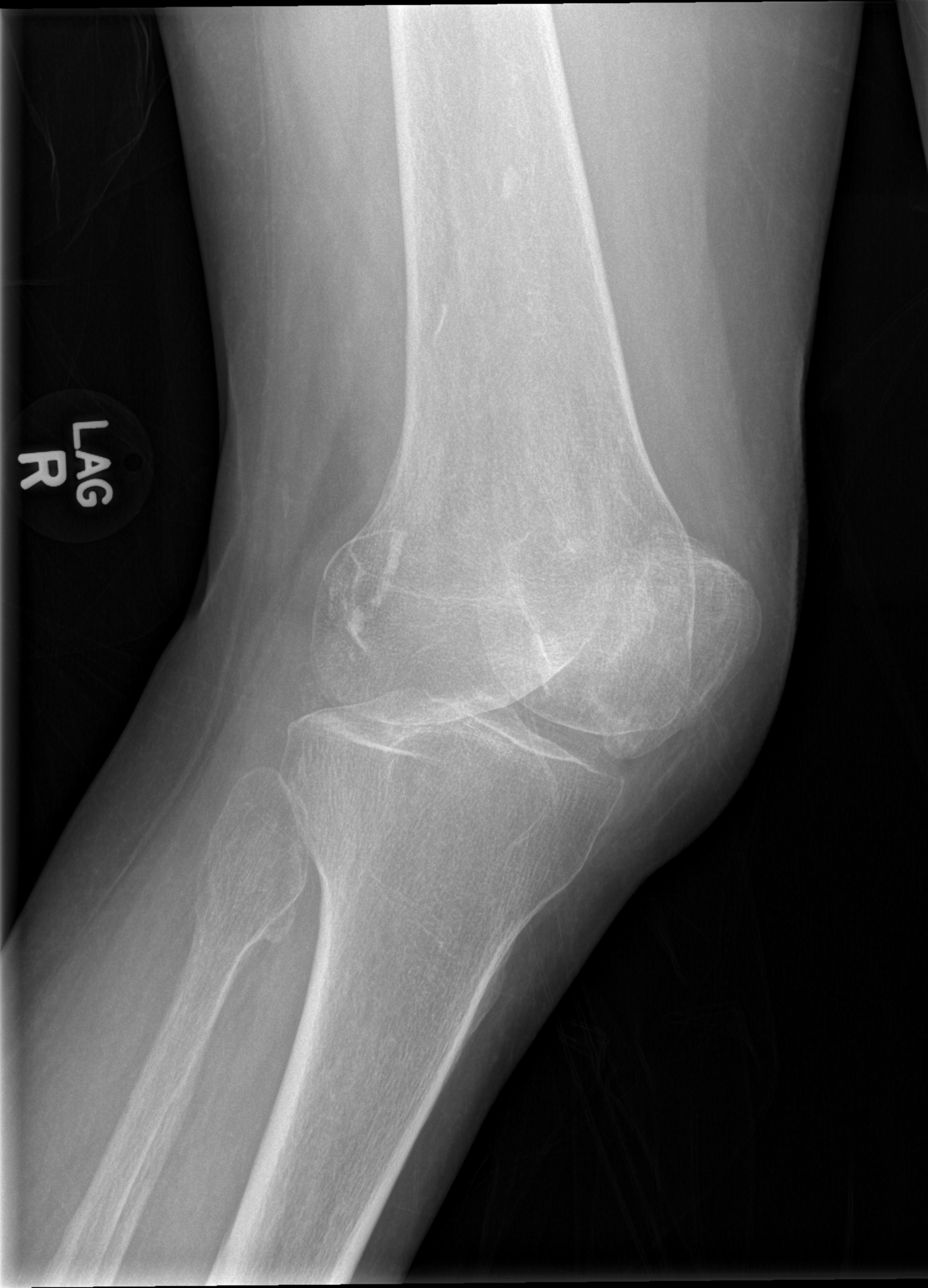

[x knee lat right]
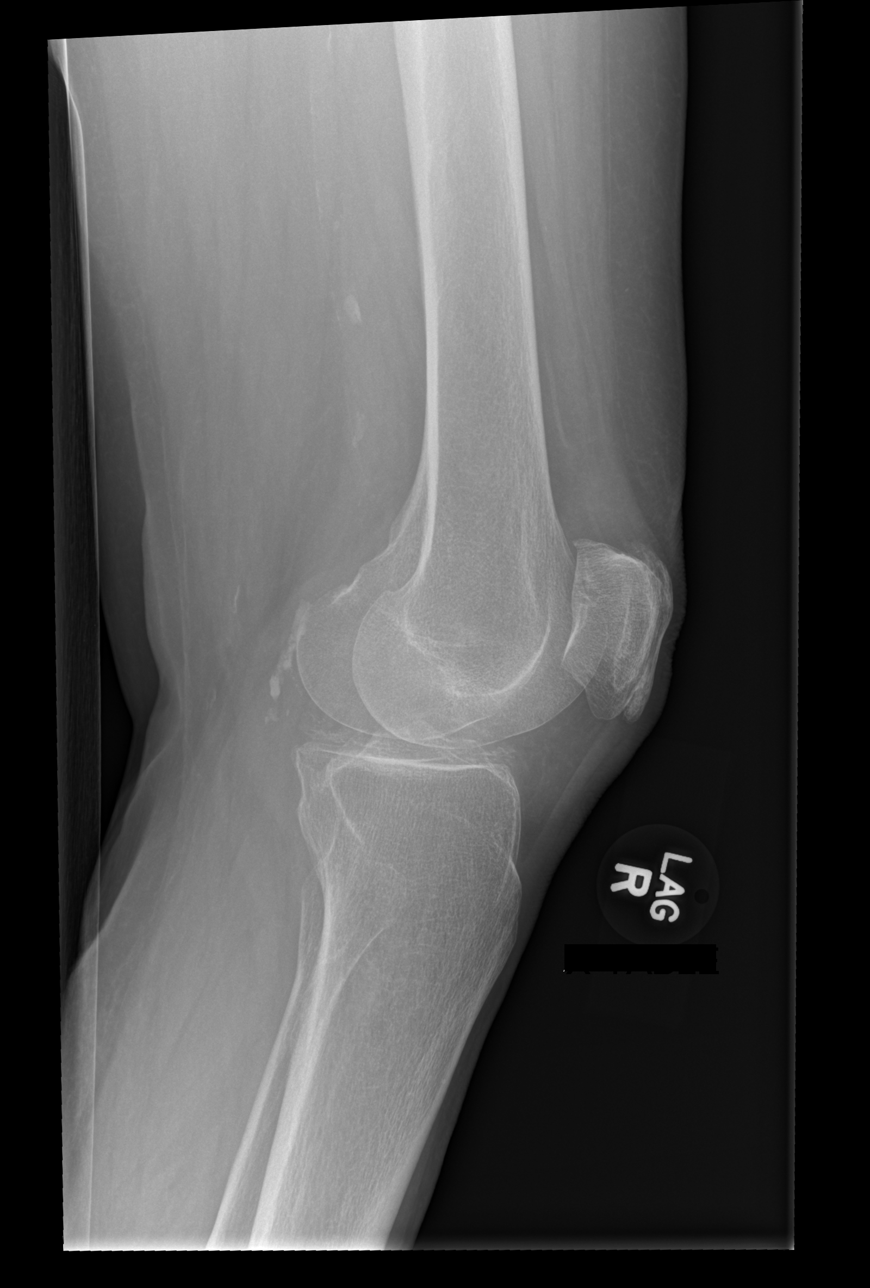

[4 of 4 positions shown; findings below may reference images not displayed]

FINDINGS: There is no evidence of fracture or dislocation. The joint spaces
are preserved. There is calcification at the menisci. Scattered
vascular calcifications are seen.

No significant joint effusion is seen. The visualized soft tissues
are otherwise unremarkable in appearance.
IMPRESSION: 1. No evidence of fracture or dislocation.
2. Scattered vascular calcifications seen.
3. Calcification at the menisci.

## 2017-11-30 IMAGING — MR MR HIP*L* W/O CM
4 of 5 series · 19 of 40 positions shown · non-contrast
Comparison: CT and plain films left hip this same day.

CLINICAL DATA: Left hip pain due to a fall last night. Initial
encounter.

EXAM:
MR OF THE LEFT HIP WITHOUT CONTRAST
TECHNIQUE: Multiplanar, multisequence MR imaging was performed. No intravenous
contrast was administered.

[Series 3: cor ir (pelvis) · coronal · 4.0mm · 0.84mm/px · 3 of 30 slices shown]
[im 5/30]
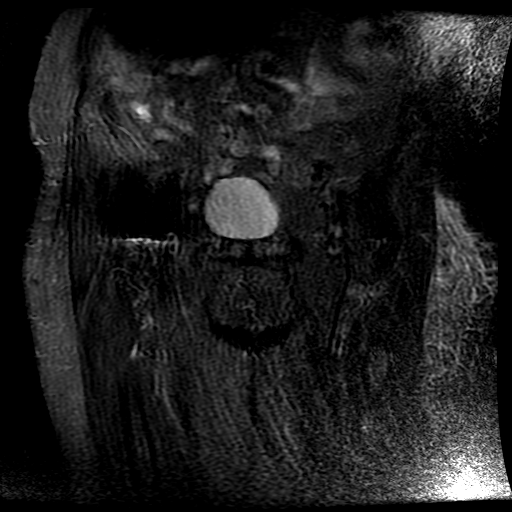
[im 17/30]
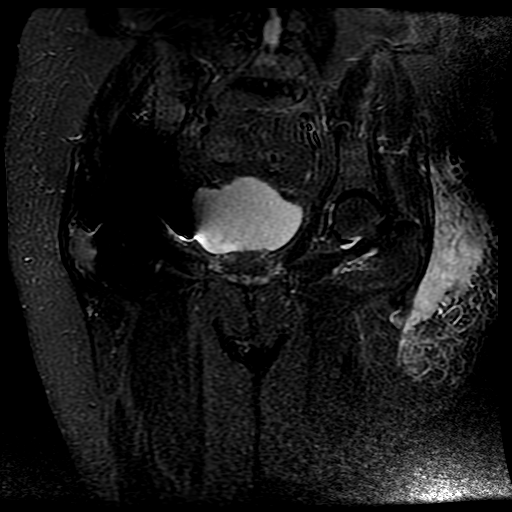
[im 25/30]
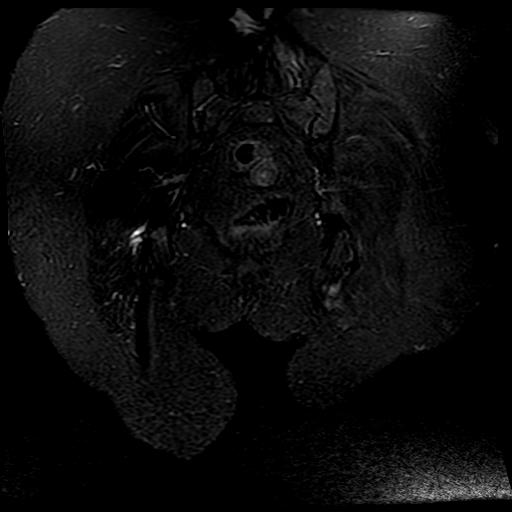

[Series 4: T1 · coronal · 4.0mm · 0.88mm/px · 7 of 30 slices shown (1 of 2)]
[im 1/30]
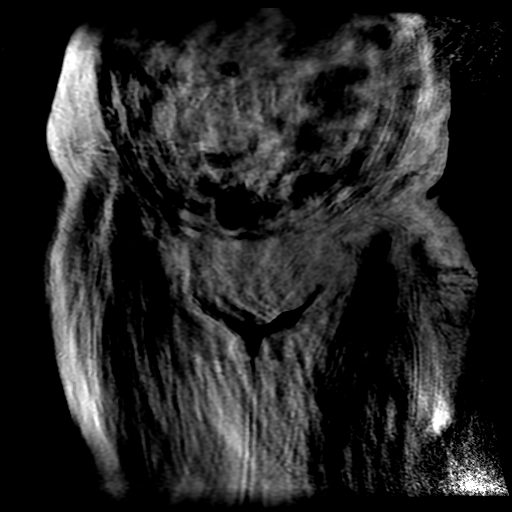
[im 5/30]
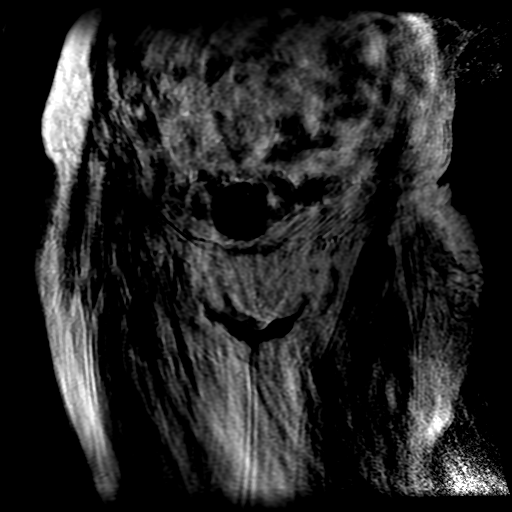
[im 10/30]
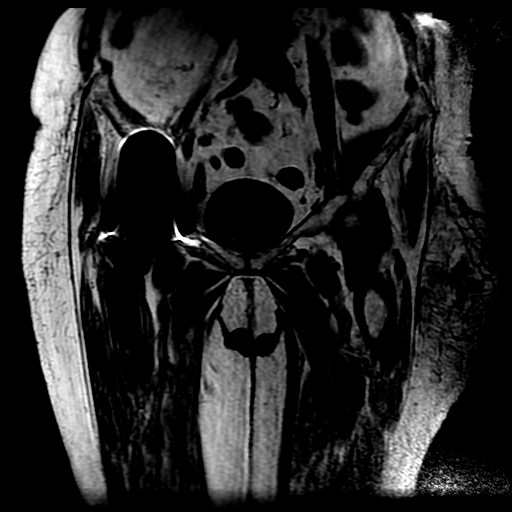
[im 15/30]
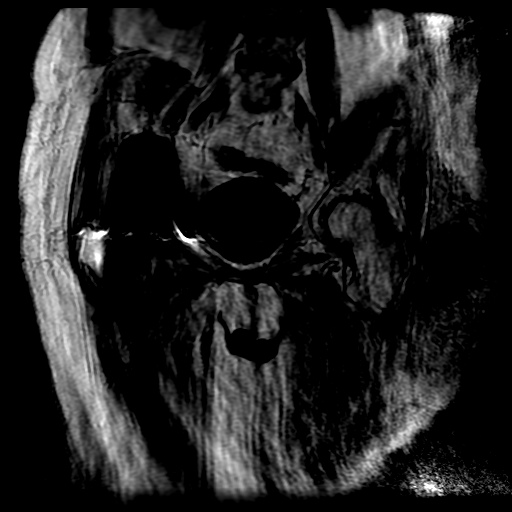
[im 20/30]
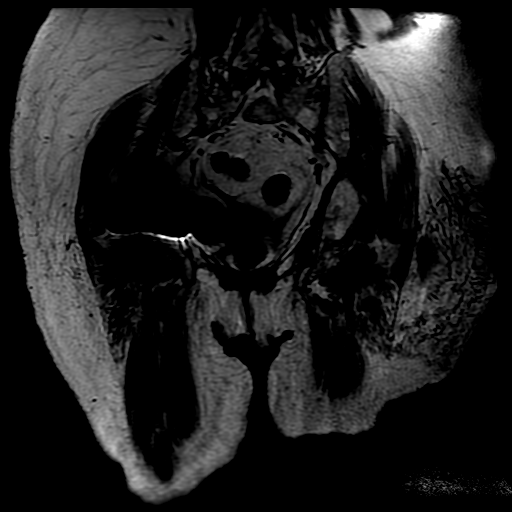
[im 25/30]
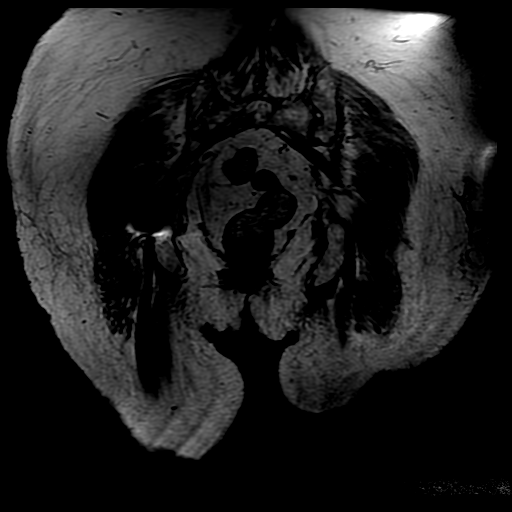
[im 30/30]
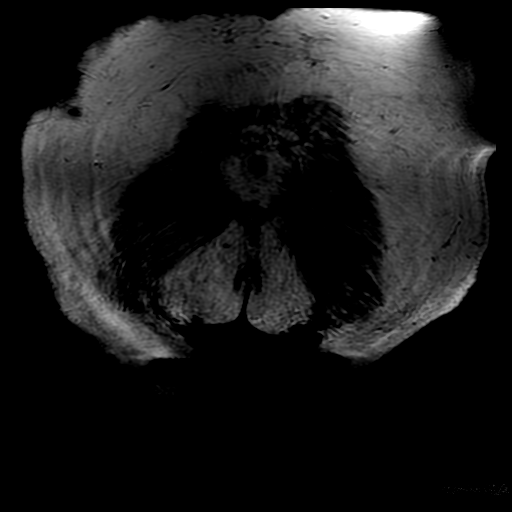

[Series 5: T1 · axial · 4.0mm · 0.82mm/px · z∈[-75,+105]mm · 4 of 42 slices shown (2 of 2)]
[im 1/42]
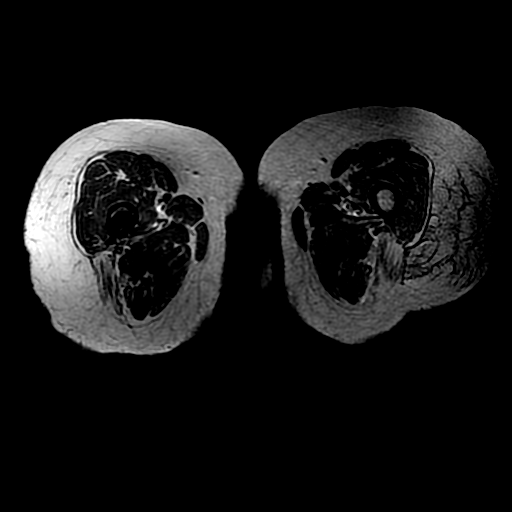
[im 5/42]
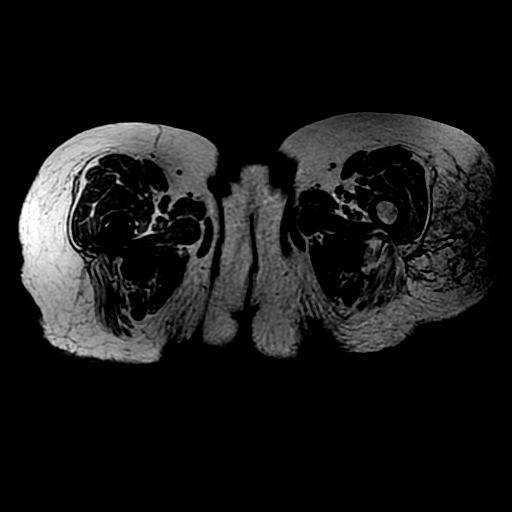
[im 23/42]
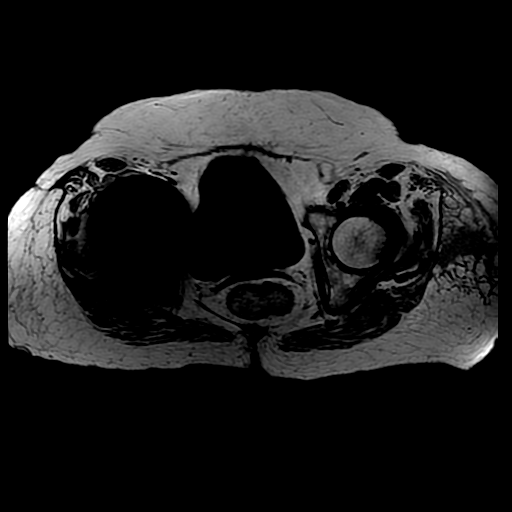
[im 37/42]
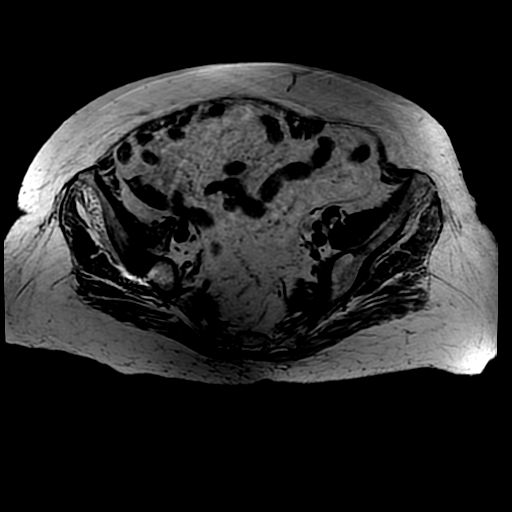

[Series 7: PD · sagittal · 4.0mm · 0.29mm/px · 5 of 22 slices shown]
[im 1/22]
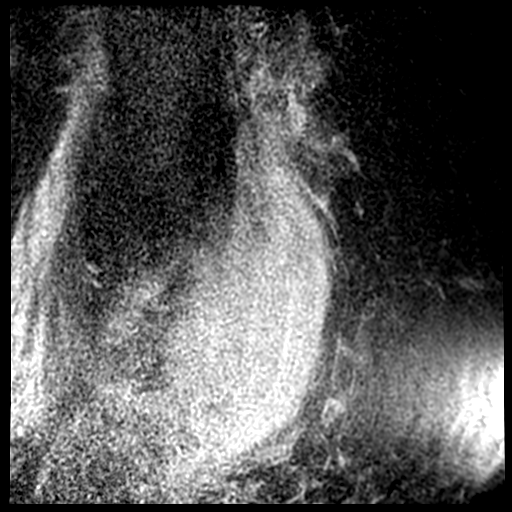
[im 6/22]
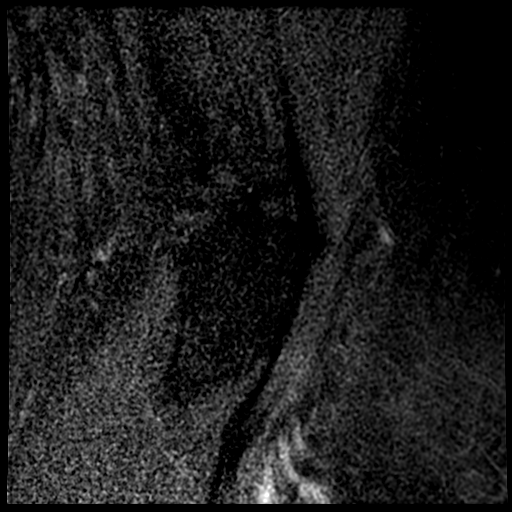
[im 11/22]
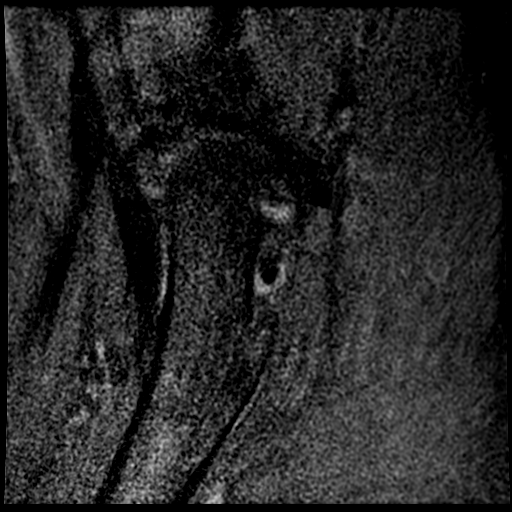
[im 16/22]
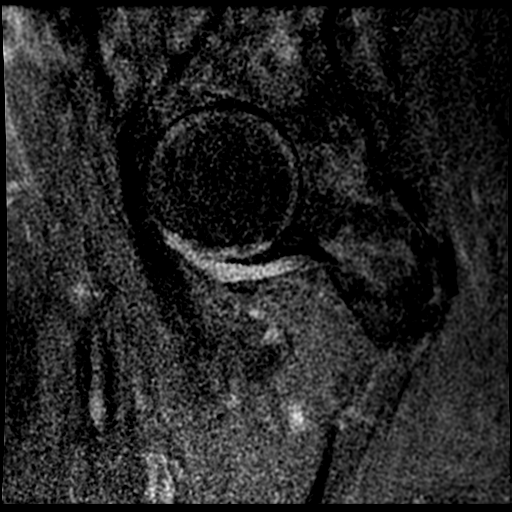
[im 22/22]
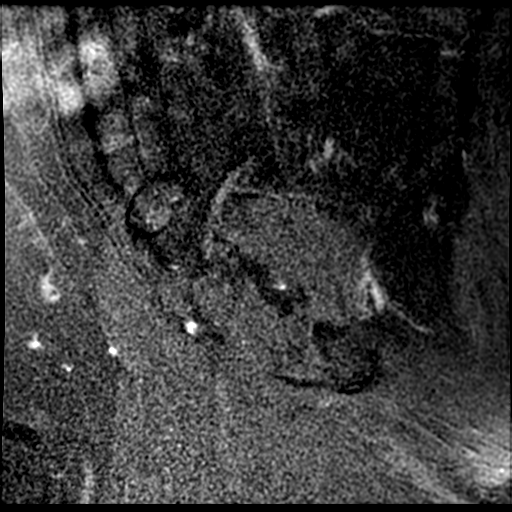

[19 of 40 positions shown; findings below may reference images not displayed]

FINDINGS: Bones: The left hip is located. No fracture is identified. Right hip
replacement is noted.

Articular cartilage and labrum

Articular cartilage:  Mildly degenerated.

Labrum:  Degenerated without tear.

Joint or bursal effusion

Joint effusion:  None.

Bursae:  Negative.

Muscles and tendons

Muscles and tendons:  Intact.

Other findings

Miscellaneous: A large hematoma in the deep subcutaneous tissues
over the left hip measures 9 cm craniocaudal by 5 cm AP by 4.5 cm
transverse.
IMPRESSION: Negative for fracture or dislocation

Large subcutaneous hematoma over the left hip.

## 2017-11-30 IMAGING — CR DG HIP (WITH OR WITHOUT PELVIS) 3-4V BILAT
5 series · 5 of 5 positions shown · non-contrast
Comparison: Bilateral hip radiographs performed 04/17/2016

CLINICAL DATA: Patient found on bathroom floor, with left hip pain.
Initial encounter.

EXAM:
DG HIP (WITH OR WITHOUT PELVIS) 3-4V BILAT

[t pelvis ap]
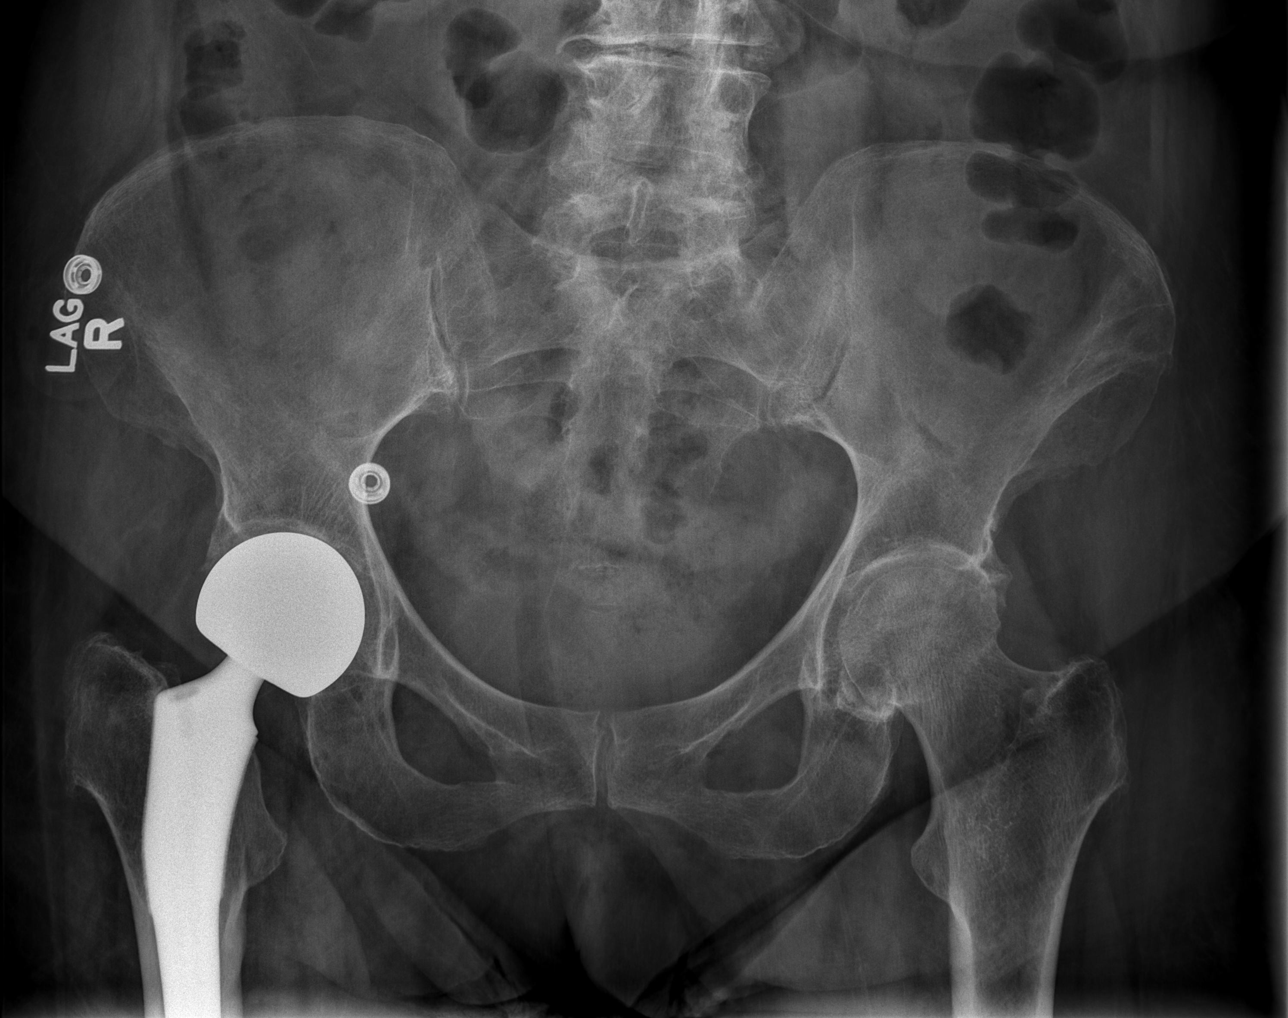

[t hip ap left]
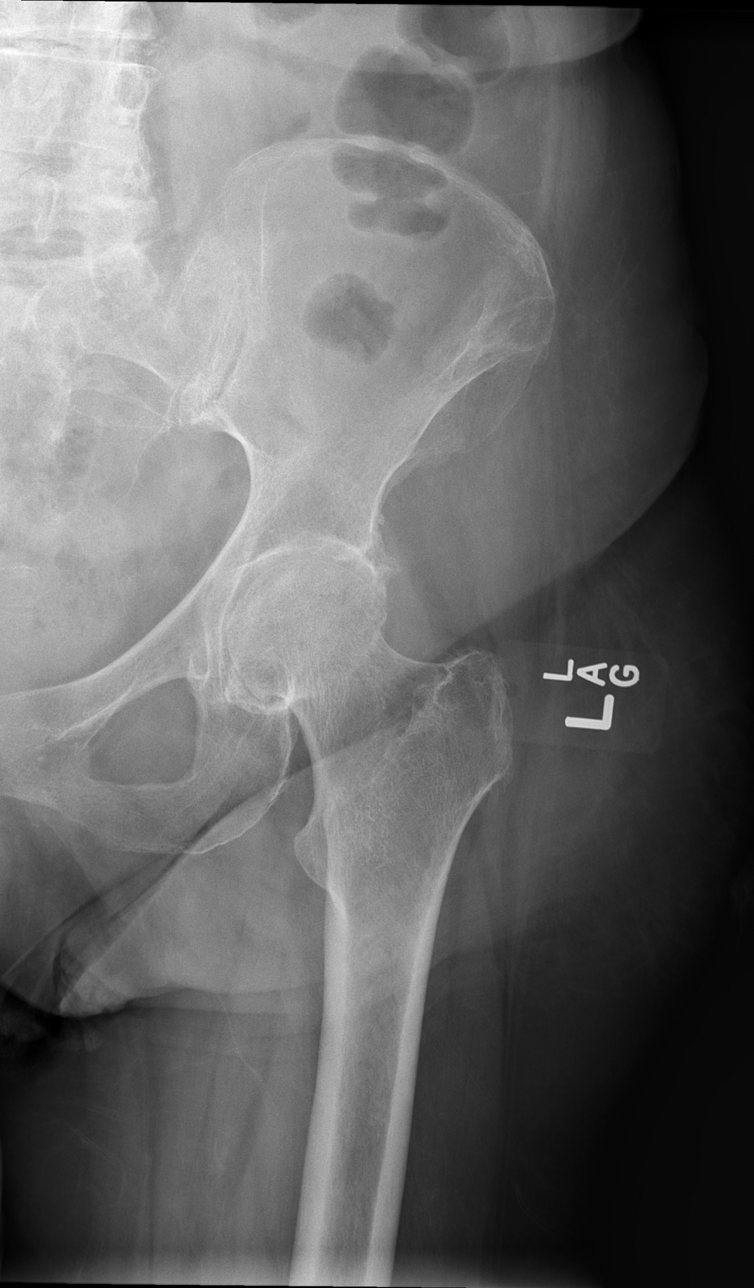

[t hip ap right]
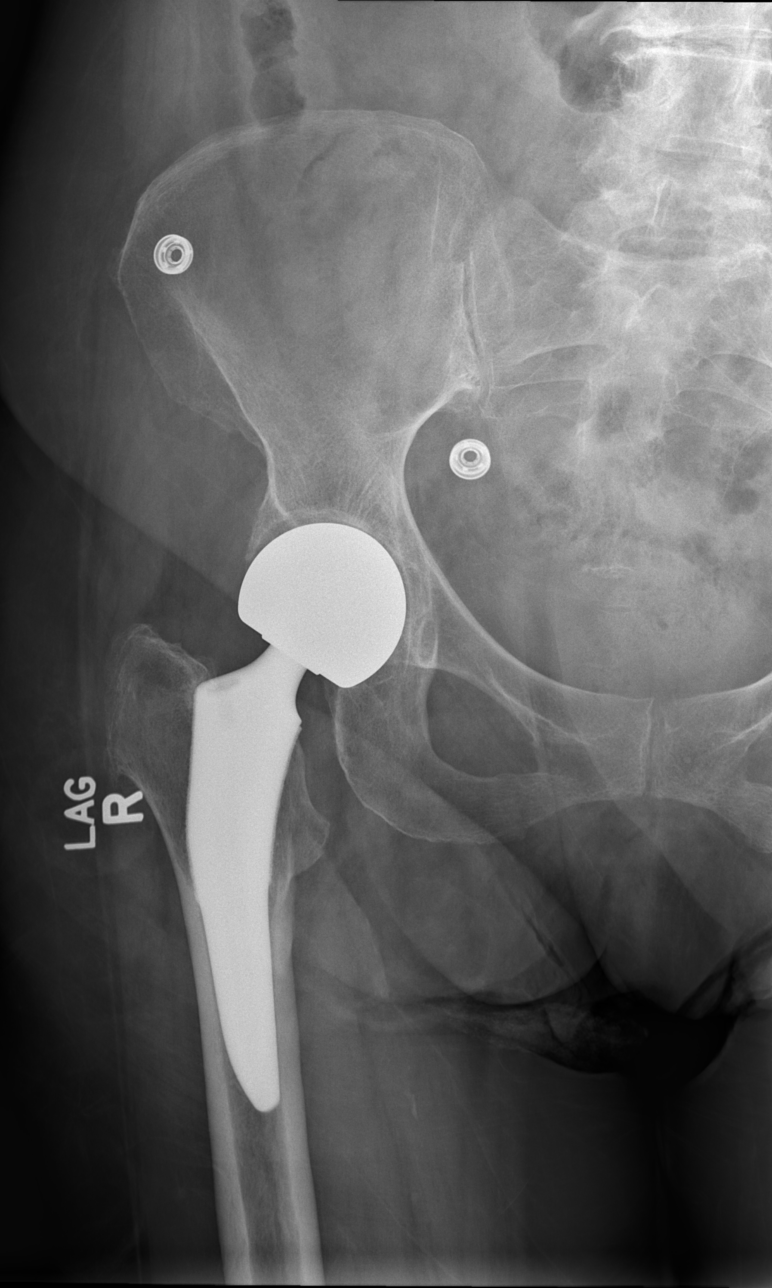

[t hip frog leg right]
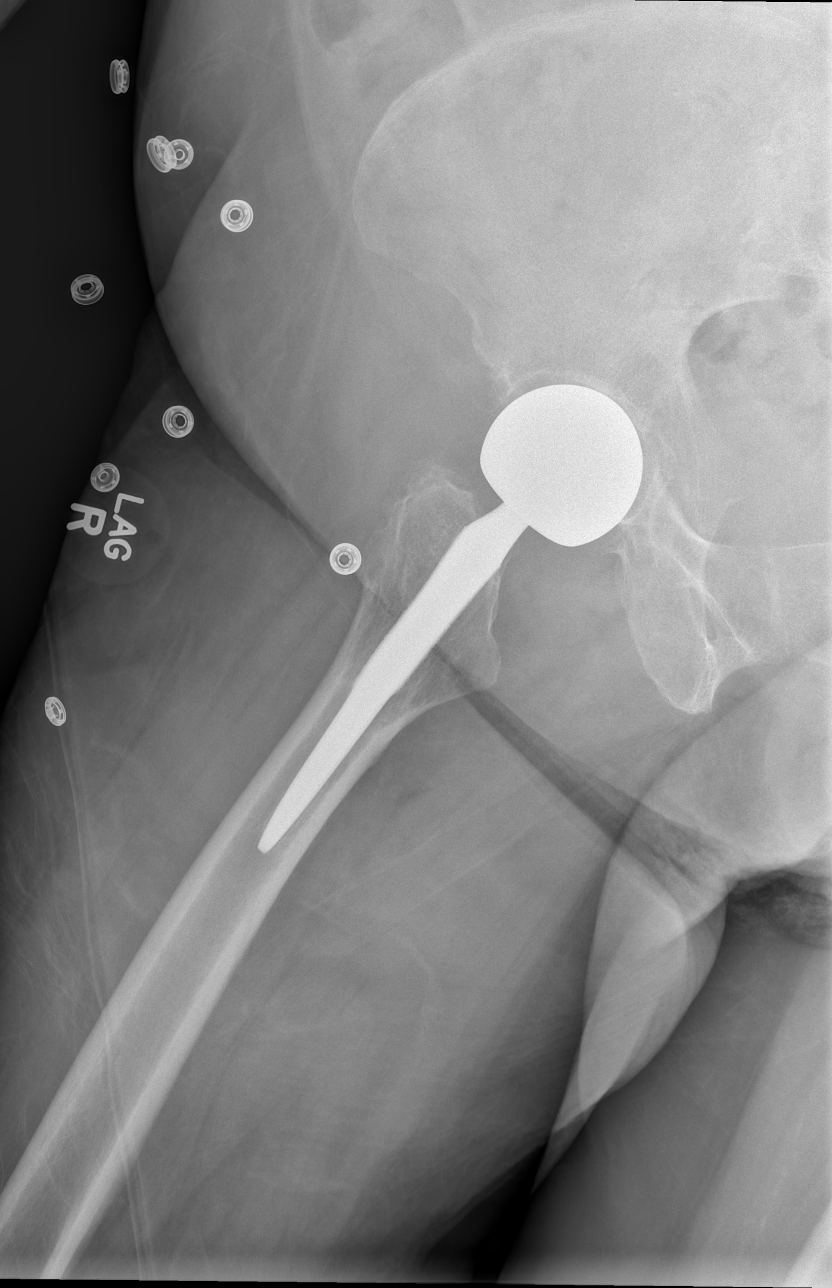

[t hip frog leg left]
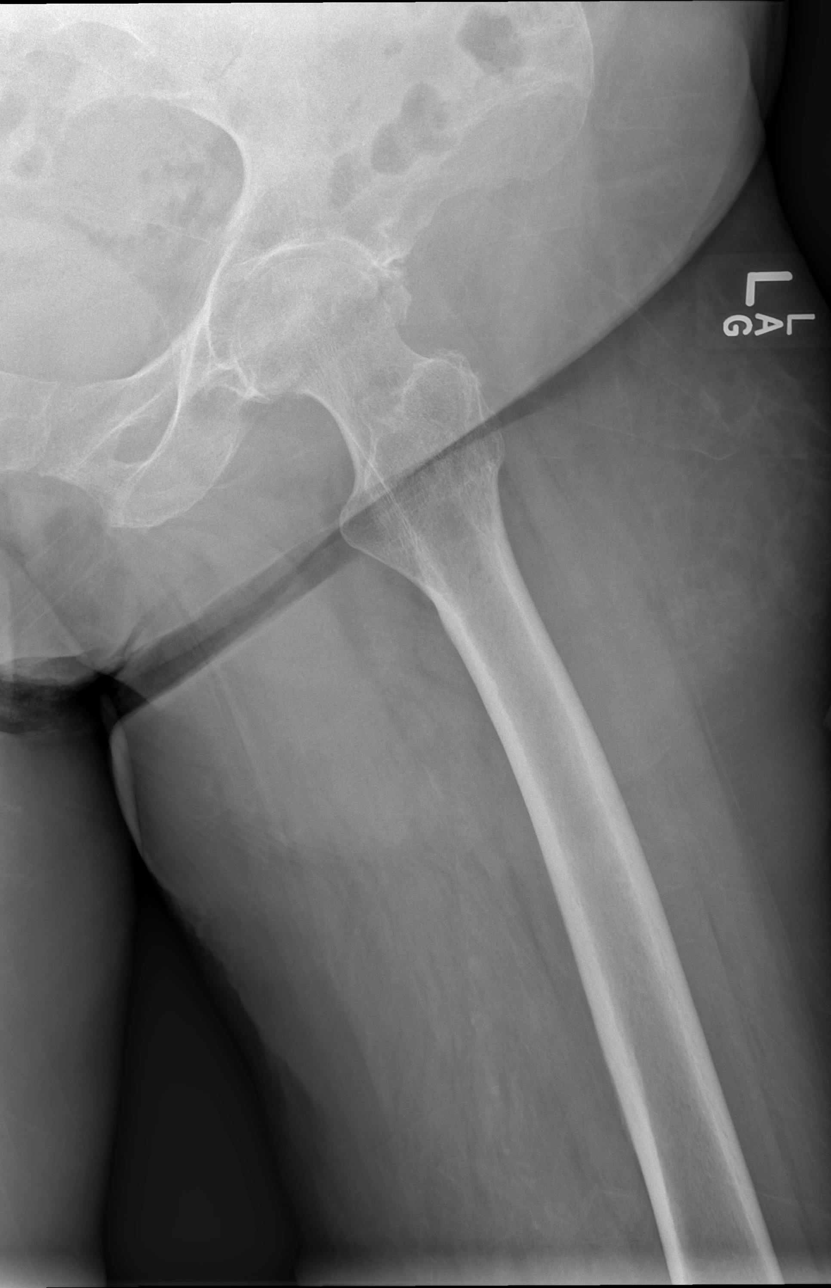

[5 of 5 positions shown; findings below may reference images not displayed]

FINDINGS: There is question of minimal cortical irregularity about the left
femoral head. Fracture cannot be entirely excluded. The patient's
right hip arthroplasty is grossly unremarkable, without evidence of
loosening. The proximal femurs appear intact. Mild degenerative
change is noted along the lower lumbar spine. The sacroiliac joints
are unremarkable in appearance.

The visualized bowel gas pattern is grossly unremarkable in
appearance.
IMPRESSION: 1. Question of minimal cortical irregularity about the left femoral
head. Fracture cannot be entirely excluded. MRI or CT could be
considered for further evaluation, if the patient's symptoms
persist.
2. Right hip arthroplasty is grossly unremarkable, without evidence
of loosening.

## 2017-11-30 IMAGING — CT CT HEAD W/O CM
4 of 8 series · 16 of 47 positions shown, 19 images · non-contrast
Comparison: None.

CLINICAL DATA: Found down

EXAM:
CT HEAD WITHOUT CONTRAST
CT CERVICAL SPINE WITHOUT CONTRAST
TECHNIQUE: Multidetector CT imaging of the head and cervical spine was
performed following the standard protocol without intravenous
contrast. Multiplanar CT image reconstructions of the cervical spine
were also generated.

[Series 4: bone windows · axial · 0.44mm/px · z∈[-149,-86]mm · 3 of 54 slices shown]
[im 11/54  bone]
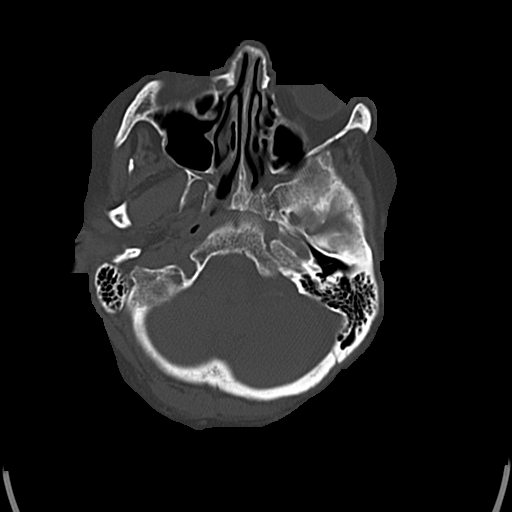
[im 22/54  bone]
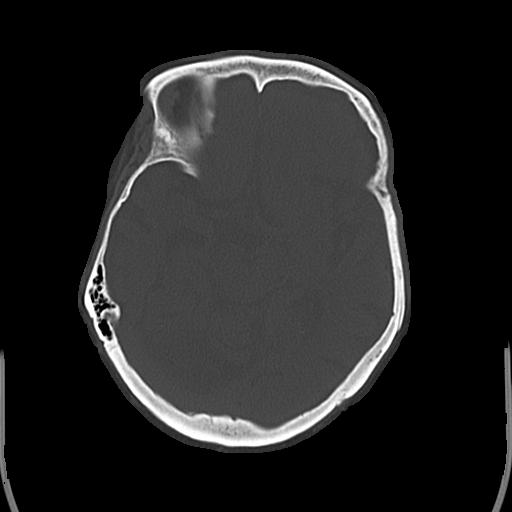
[im 32/54  bone]
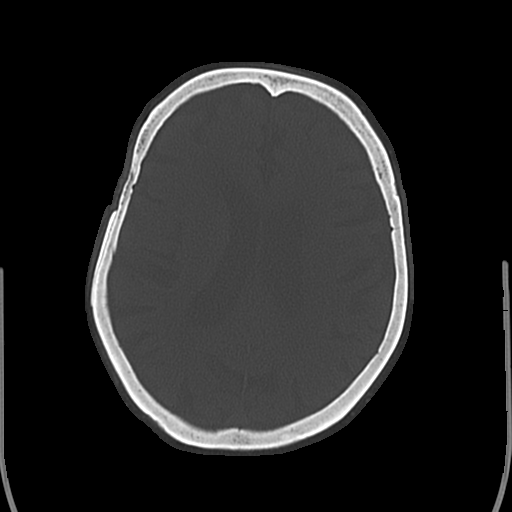

[Series 5: coronal · coronal · 0.30mm/px · 3 of 59 slices shown]
[im 15/59  brain]
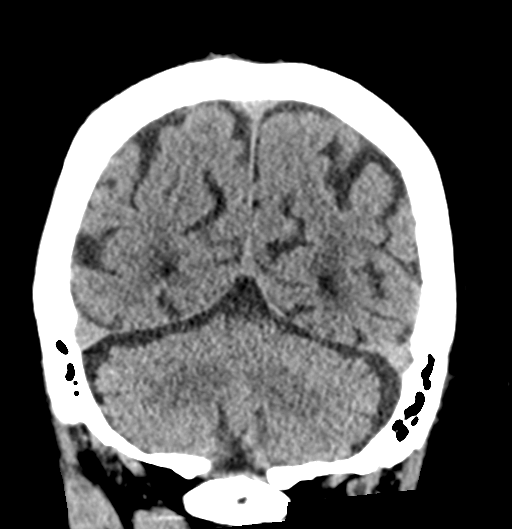
[im 30/59  brain]
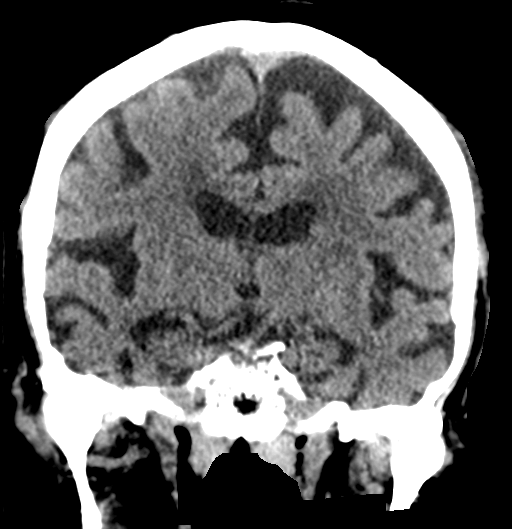
[im 44/59  brain]
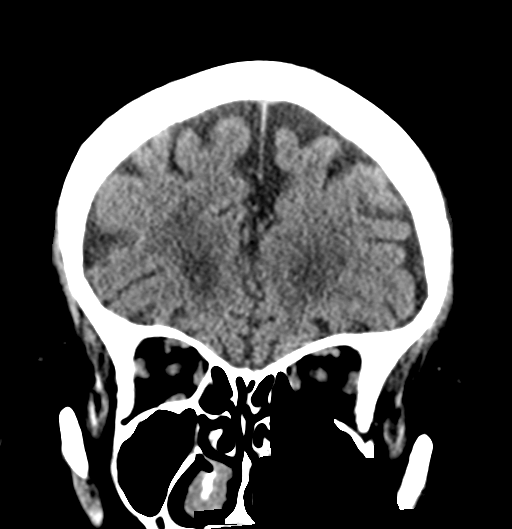

[Series 6: sagittal · sagittal · 0.31mm/px · 1 of 48 slices shown]
[im 24/48  brain]
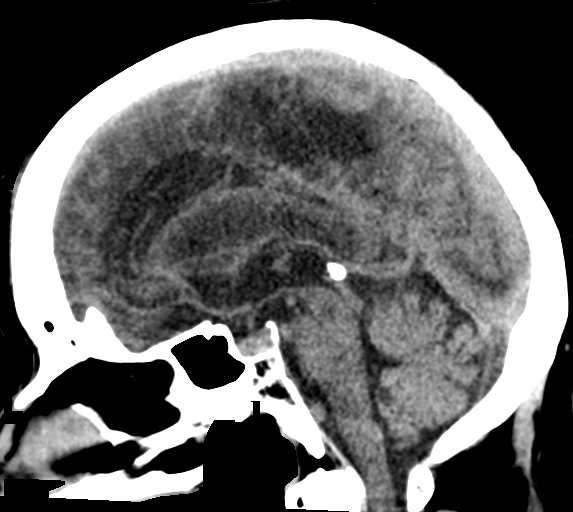

[Series 9: axial recon · axial · 0.20mm/px · z∈[-348,-204]mm · 9 of 100 slices shown, 12 images]
[im 10/100  brain]
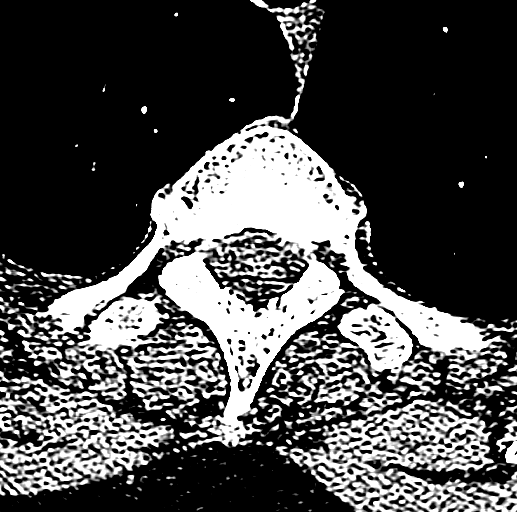
[im 10/100  bone]
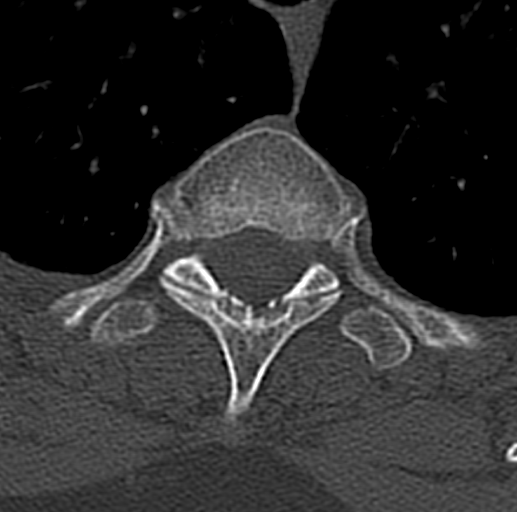
[im 20/100  brain]
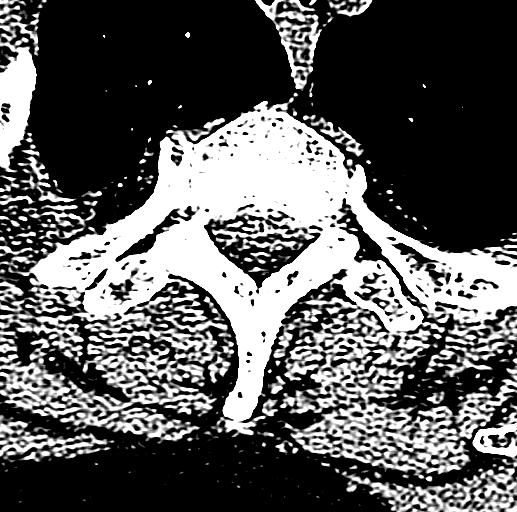
[im 30/100  brain]
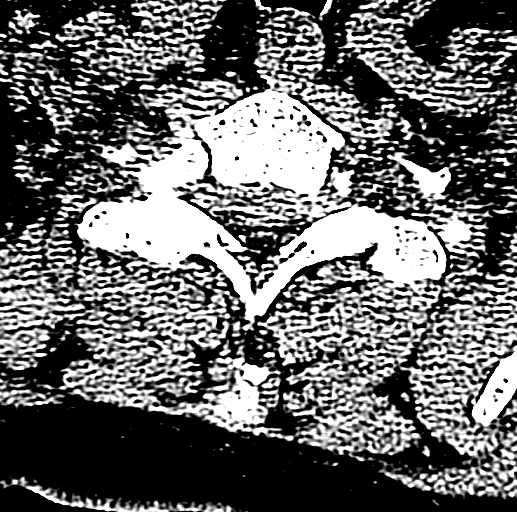
[im 40/100  brain]
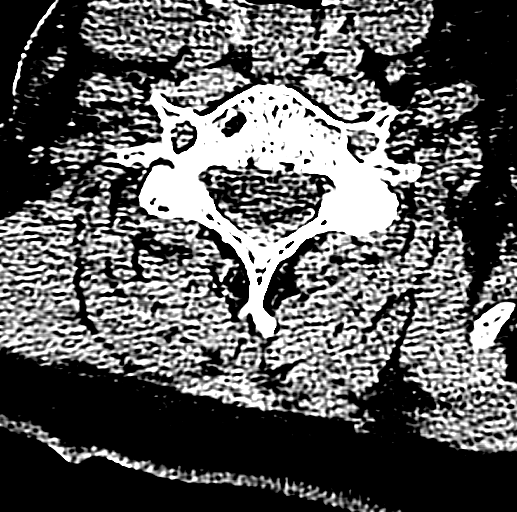
[im 50/100  brain]
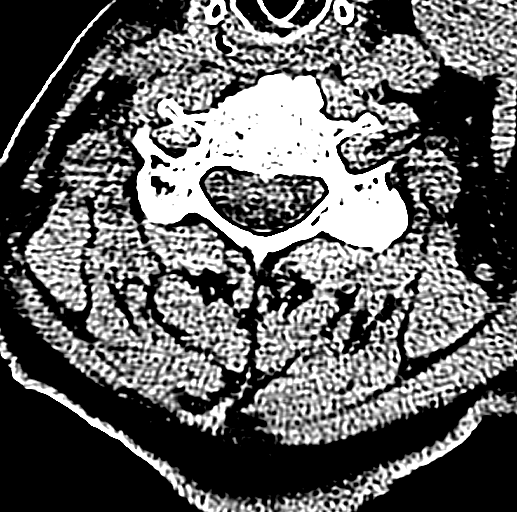
[im 50/100  bone]
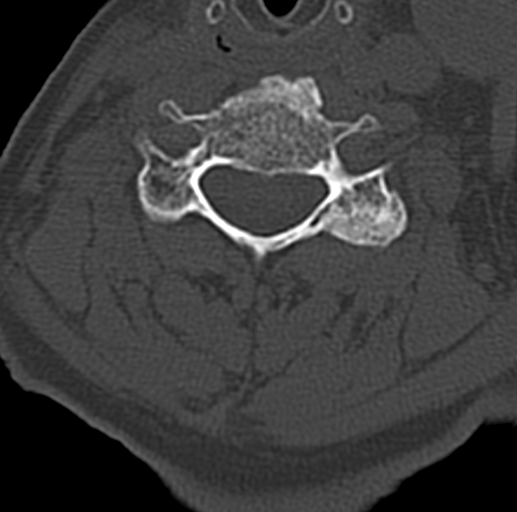
[im 60/100  brain]
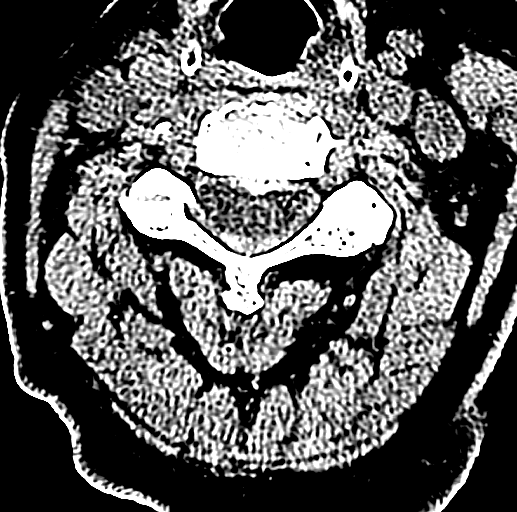
[im 70/100  brain]
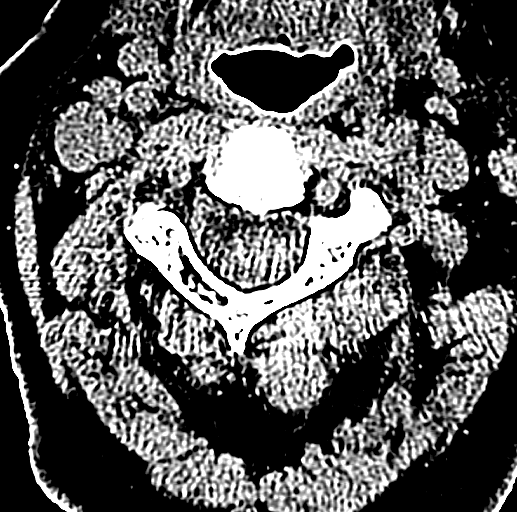
[im 80/100  brain]
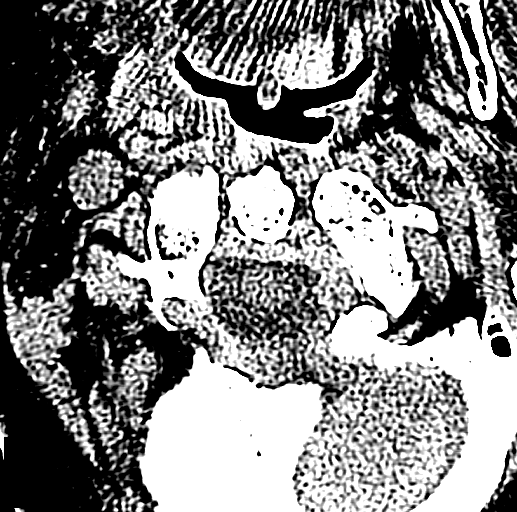
[im 90/100  brain]
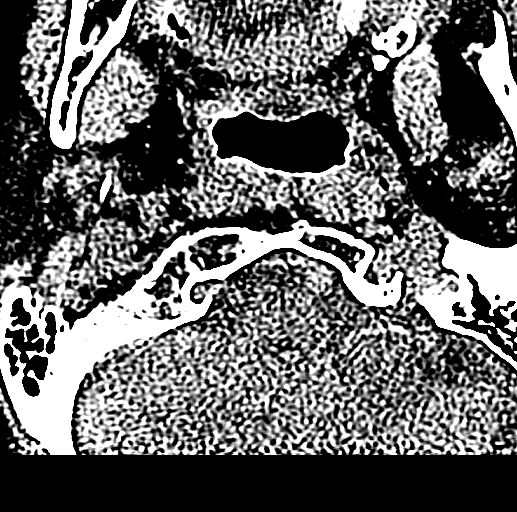
[im 90/100  bone]
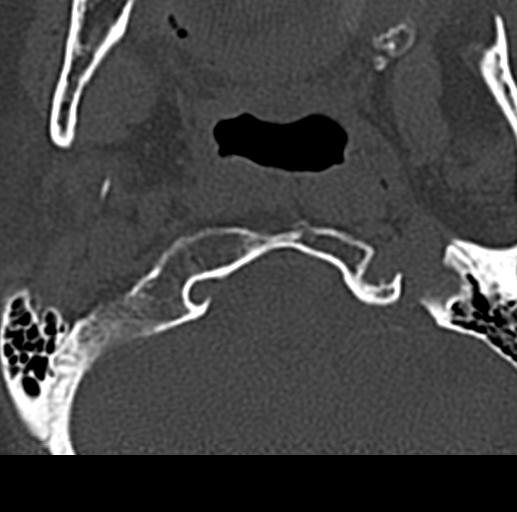

[16 of 47 positions shown; findings below may reference images not displayed]

FINDINGS: CT HEAD FINDINGS

Brain: No mass lesion, intraparenchymal hemorrhage or extra-axial
collection. No evidence of acute cortical infarct. Brain parenchyma
and CSF-containing spaces are normal for age.

Vascular: No hyperdense vessel or unexpected calcification.

Skull: There is mild left parietal scalp soft tissue swelling.

Sinuses/Orbits: No sinus fluid levels or advanced mucosal
thickening. No mastoid effusion. Normal orbits.

CT CERVICAL SPINE FINDINGS

Alignment: No static subluxation. Facets are aligned. Occipital
condyles are normally positioned.

Skull base and vertebrae: No acute fracture.

Soft tissues and spinal canal: No prevertebral fluid or swelling. No
visible canal hematoma.

Disc levels: No advanced spinal canal or neural foraminal stenosis.

Upper chest: No pneumothorax, pulmonary nodule or pleural effusion.

Other: Normal visualized paraspinal cervical soft tissues.
IMPRESSION: 1. Mild left parietal scalp soft tissue swelling without underlying
skull fracture or intracranial abnormality.
2. Findings of chronic microvascular ischemia.
3. No acute fracture or static subluxation of the cervical spine.

## 2017-11-30 IMAGING — CR DG THORACIC SPINE 2V
3 series · 3 of 3 positions shown · non-contrast
Comparison: Chest radiograph performed 06/22/2016

CLINICAL DATA: Acute onset of upper back pain.  Initial encounter.

EXAM:
THORACIC SPINE 2 VIEWS

[t thoracic spine ap]
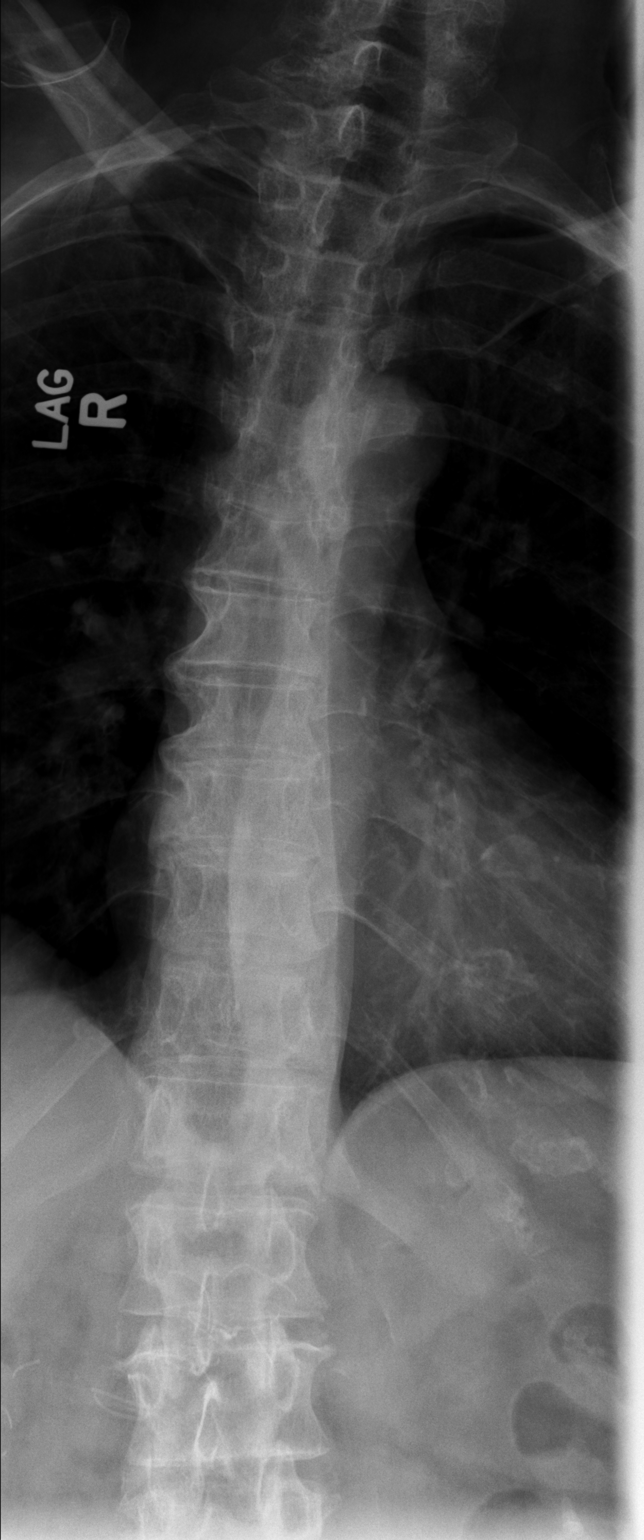

[t thoracic spine lat]
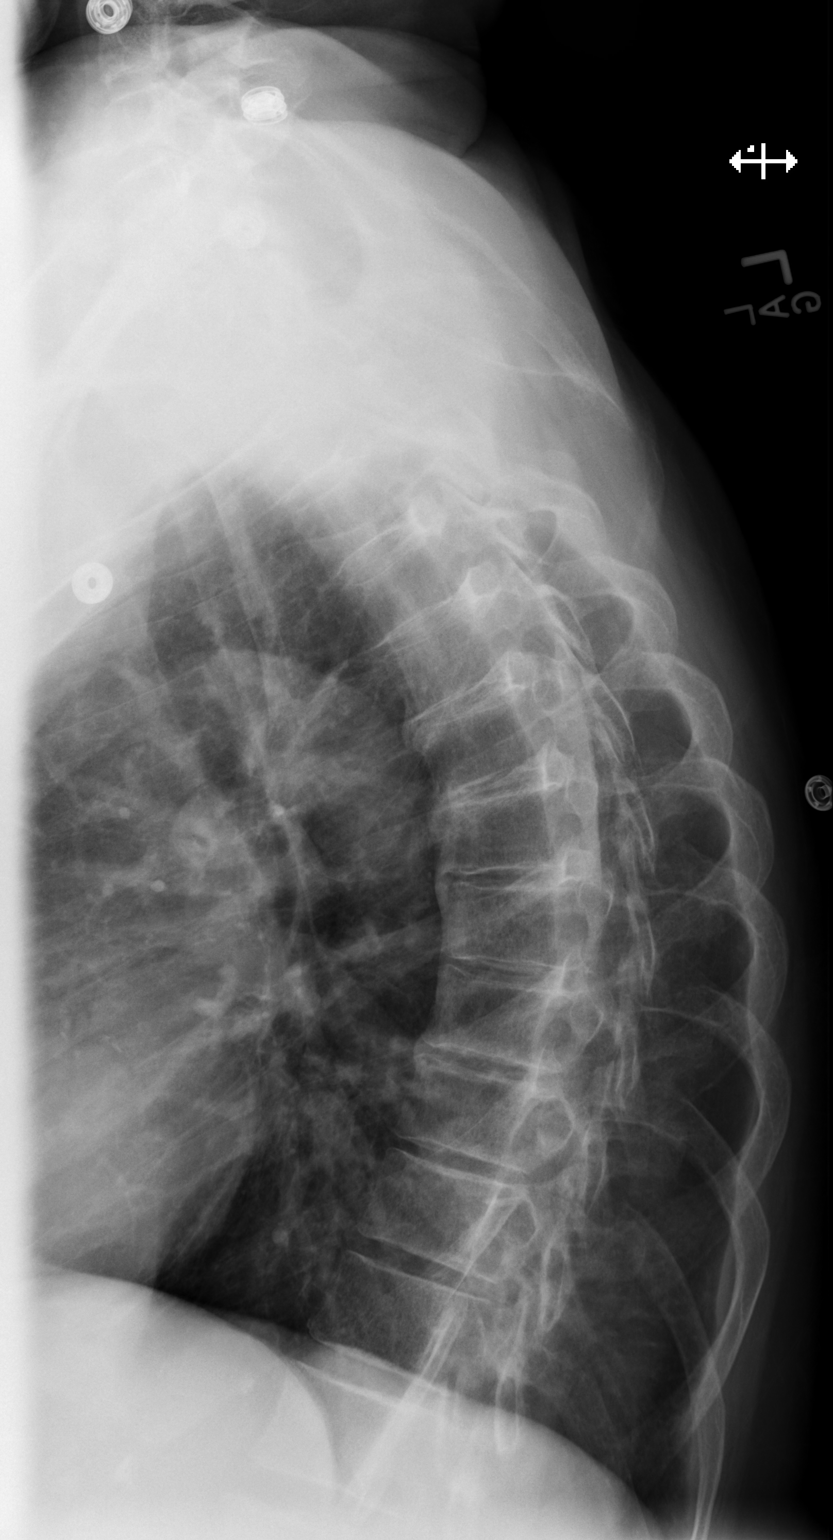

[t thoracic swimmers]
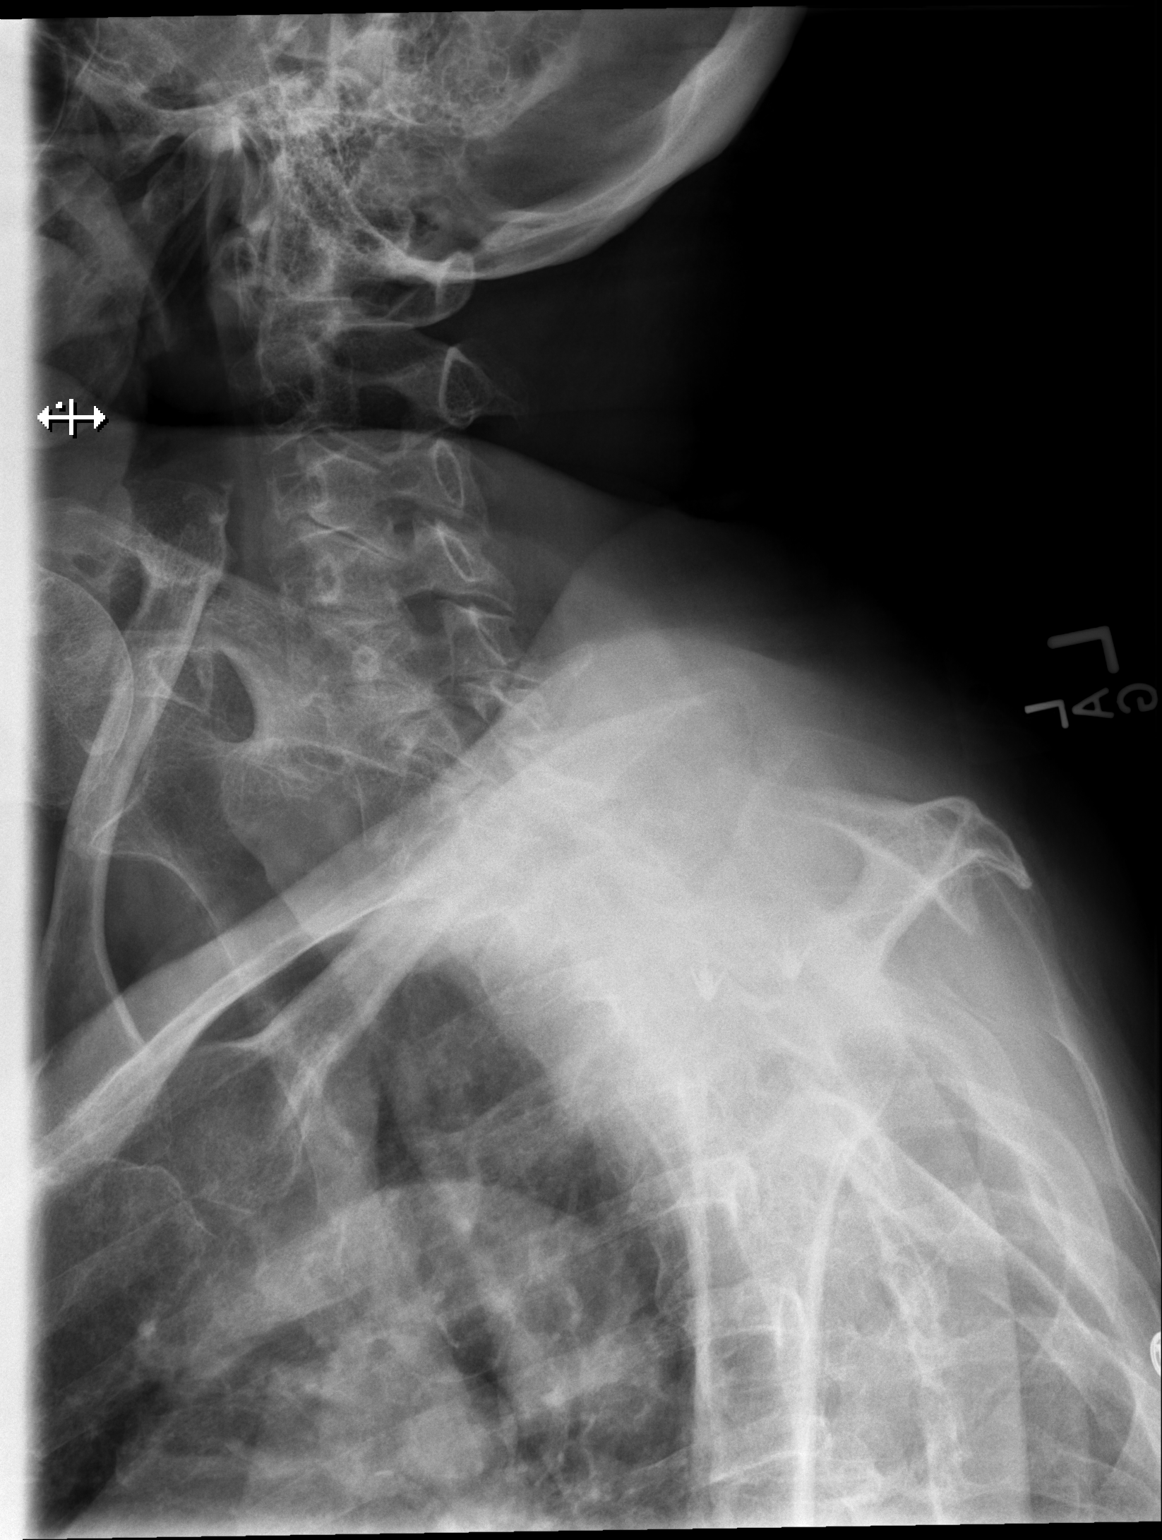

[3 of 3 positions shown; findings below may reference images not displayed]

FINDINGS: There is no evidence of fracture or subluxation. Vertebral bodies
demonstrate normal height and alignment. Intervertebral disc spaces
are preserved.

The visualized portions of both lungs are clear. The mediastinum is
unremarkable in appearance.
IMPRESSION: No evidence of fracture or subluxation along the thoracic spine.

## 2017-11-30 IMAGING — CR DG KNEE COMPLETE 4+V*L*
4 series · 4 of 4 positions shown · non-contrast
Comparison: Left knee radiographs performed 04/17/2016

CLINICAL DATA: Acute onset of left knee pain. Patient found on
floor. Initial encounter.

EXAM:
LEFT KNEE - COMPLETE 4+ VIEW

[t knee obl left (1 of 2)]
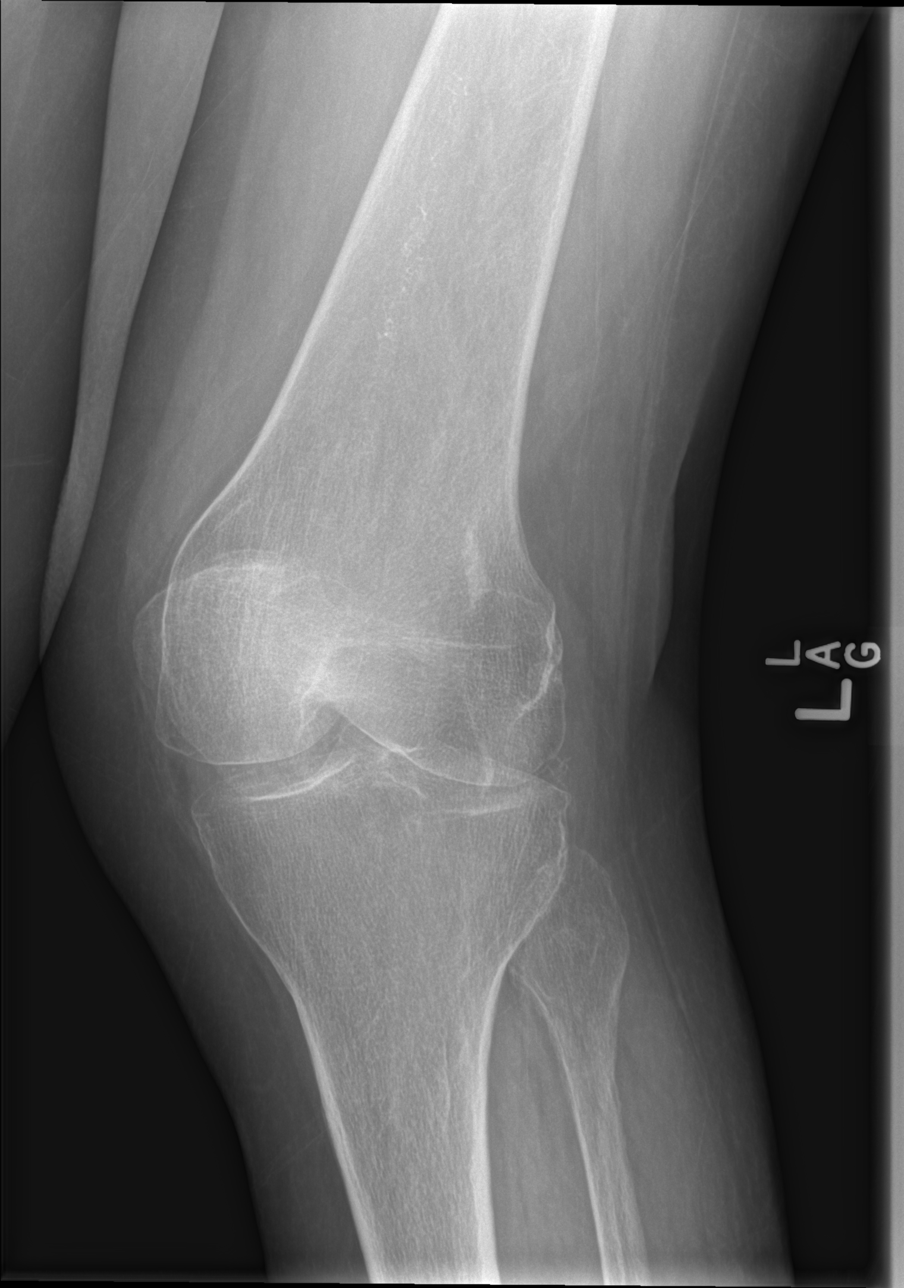

[x knee ap left]
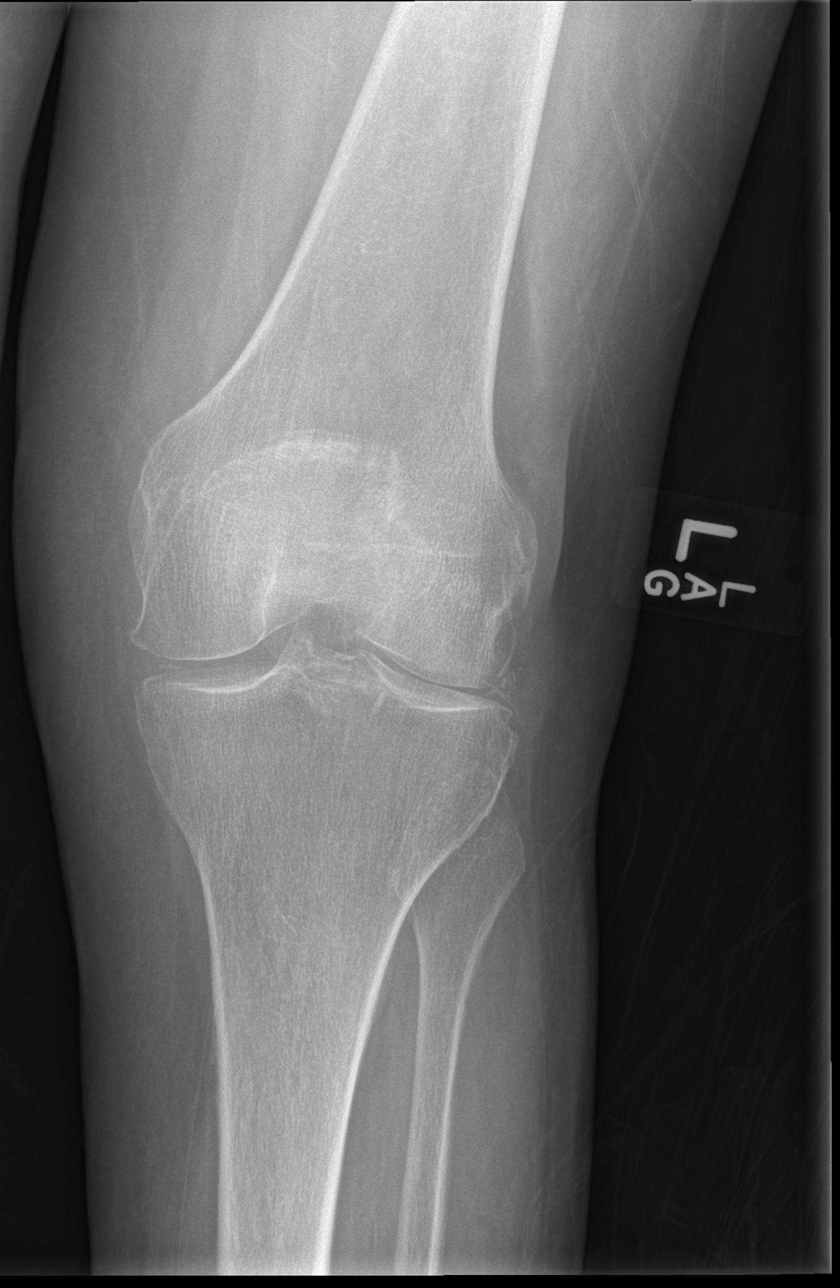

[t knee obl left (2 of 2)]
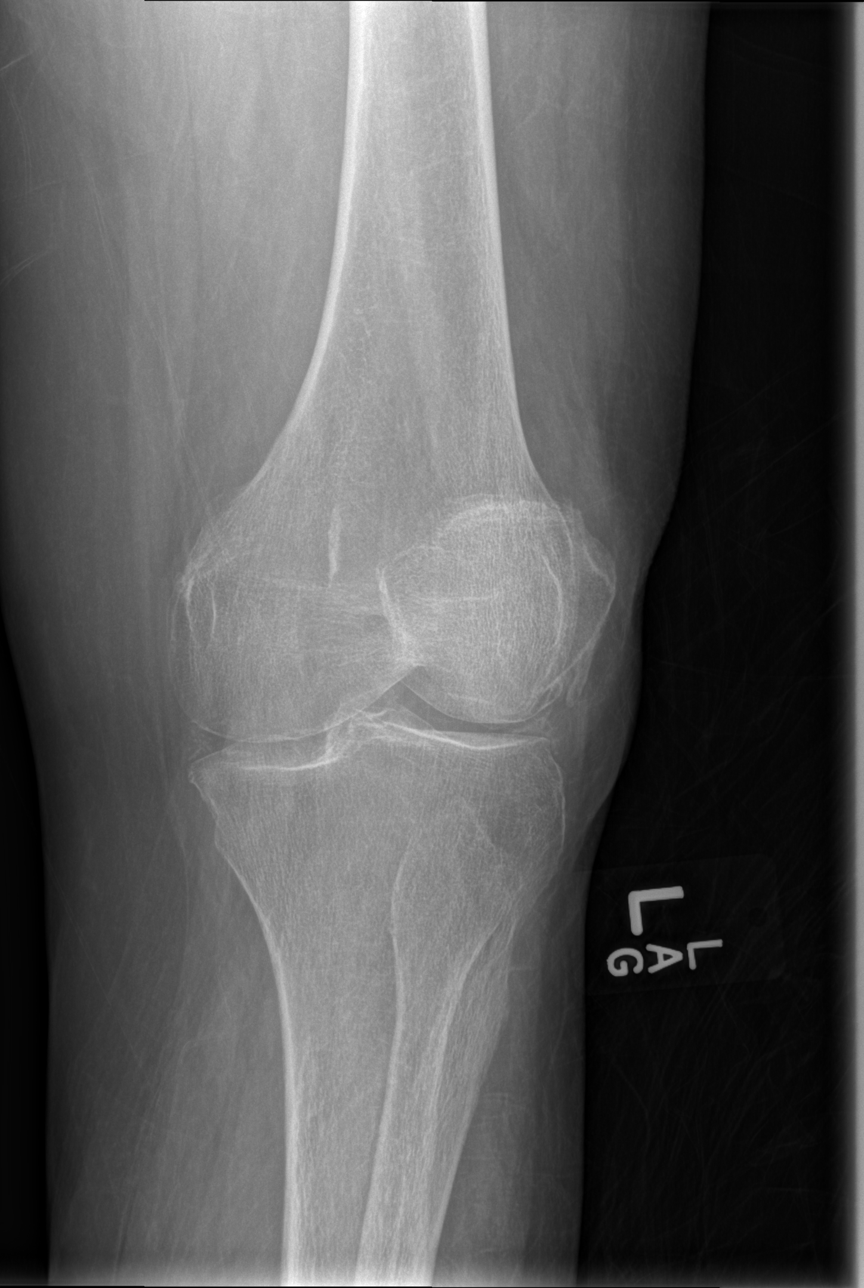

[x knee lat left]
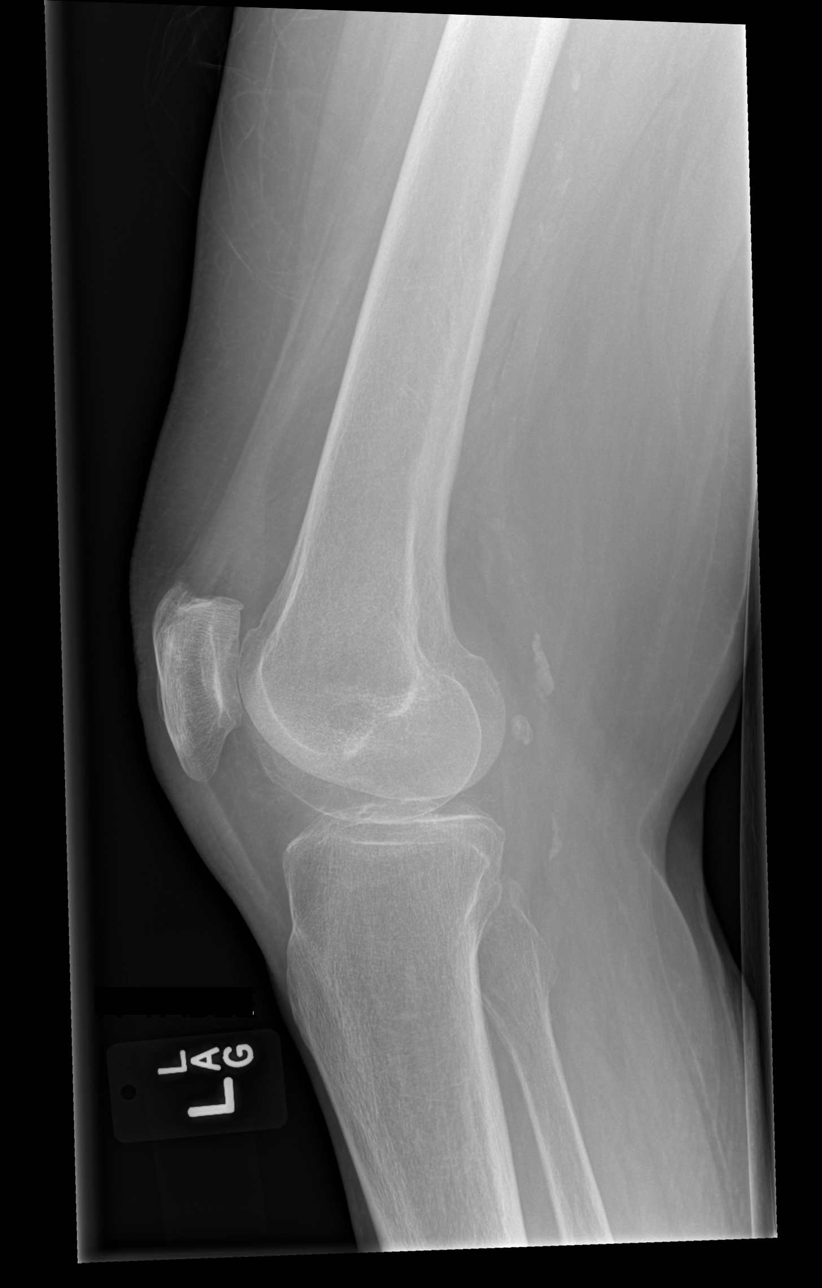

[4 of 4 positions shown; findings below may reference images not displayed]

FINDINGS: There is no evidence of fracture or dislocation. The joint spaces
are preserved. No significant degenerative change is seen; the
patellofemoral joint is grossly unremarkable in appearance. A
fabella is noted. Scattered vascular calcifications are seen.

A small knee joint effusion is noted. There is calcification of the
menisci.
IMPRESSION: 1. No evidence of fracture or dislocation.
2. Small knee joint effusion noted.
3. Scattered vascular calcifications seen.
4. Calcification of the menisci.

## 2017-11-30 IMAGING — CT CT HIP*L* W/O CM
3 of 4 series · 11 of 46 positions shown, 16 images · non-contrast
Comparison: Bilateral hip radiographs performed earlier today at
[DATE] a.m.

CLINICAL DATA: Status post fall, with left hip pain. Initial
encounter.

EXAM:
CT OF THE LEFT HIP WITHOUT CONTRAST
TECHNIQUE: Multidetector CT imaging of the left hip was performed according to
the standard protocol. Multiplanar CT image reconstructions were
also generated.

[Series 4: hip st · axial · 0.41mm/px · z∈[+1119,+1229]mm · 7 of 30 slices shown, 12 images]
[im 4/30  soft-tissue]
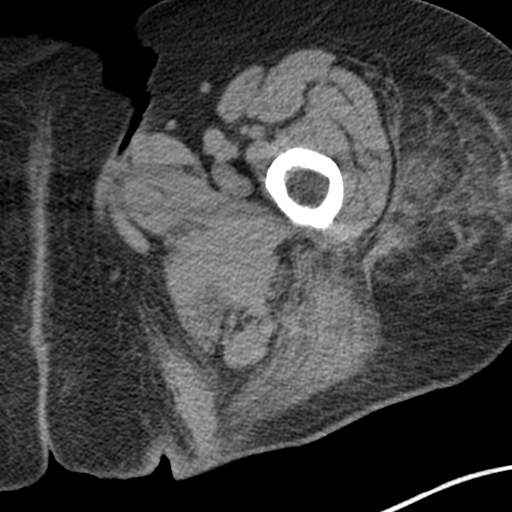
[im 4/30  bone]
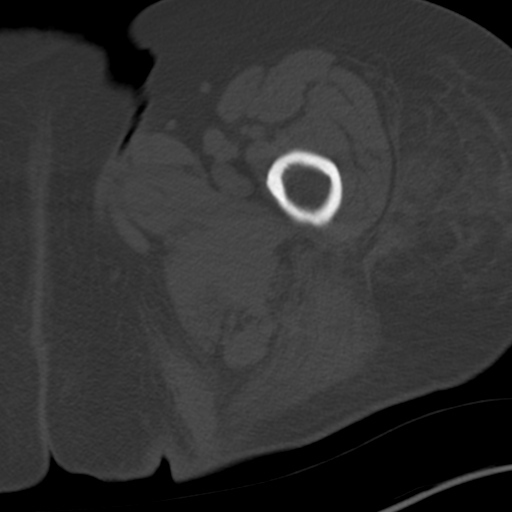
[im 8/30  soft-tissue]
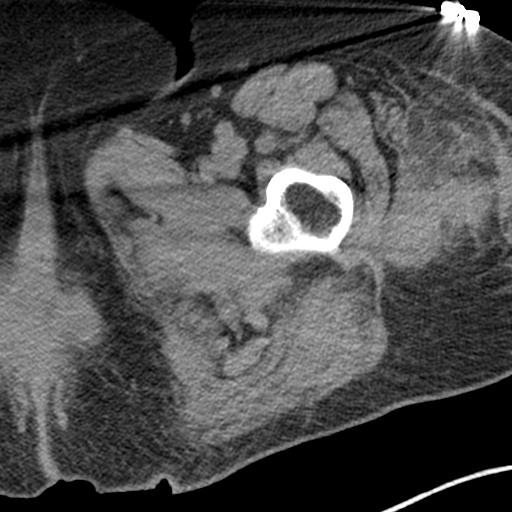
[im 11/30  soft-tissue]
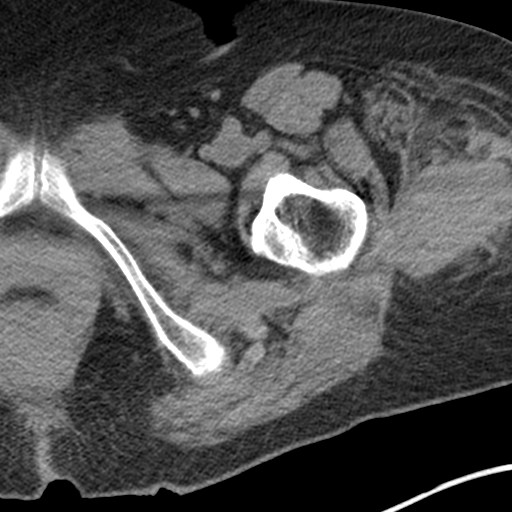
[im 15/30  soft-tissue]
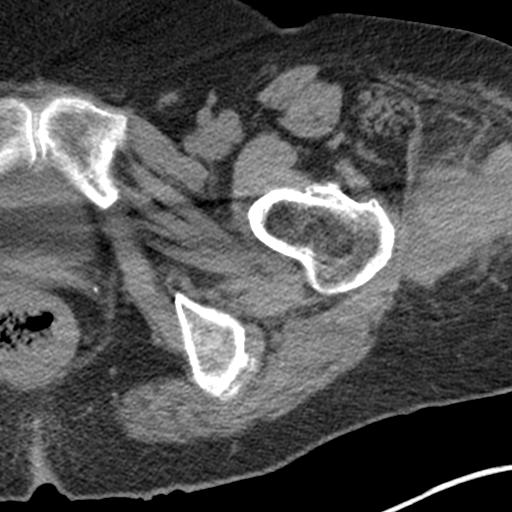
[im 15/30  lung]
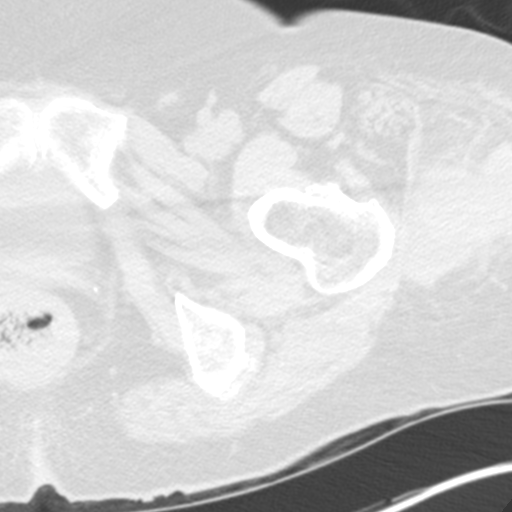
[im 19/30  soft-tissue]
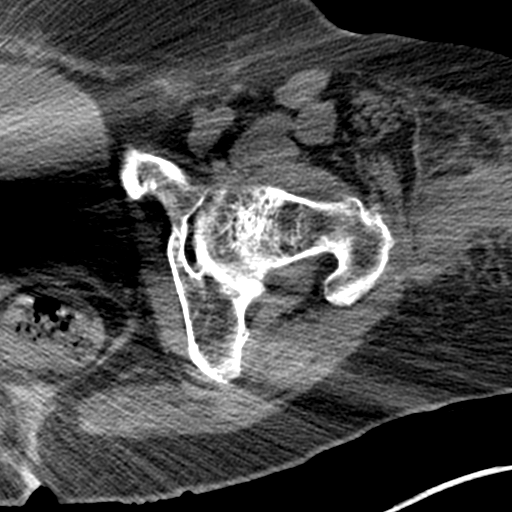
[im 19/30  lung]
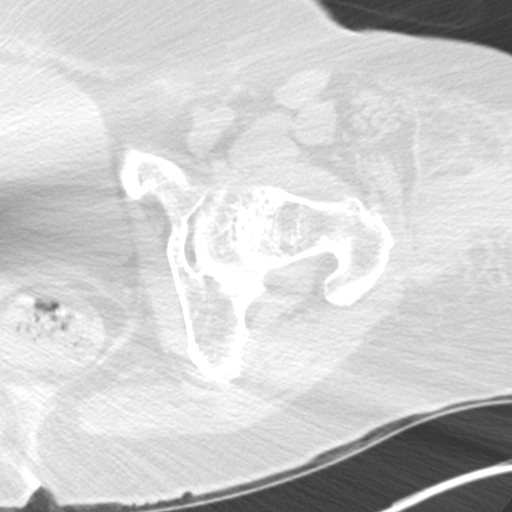
[im 22/30  soft-tissue]
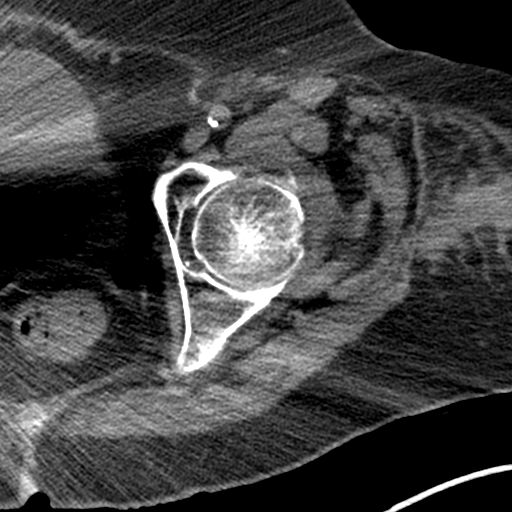
[im 22/30  lung]
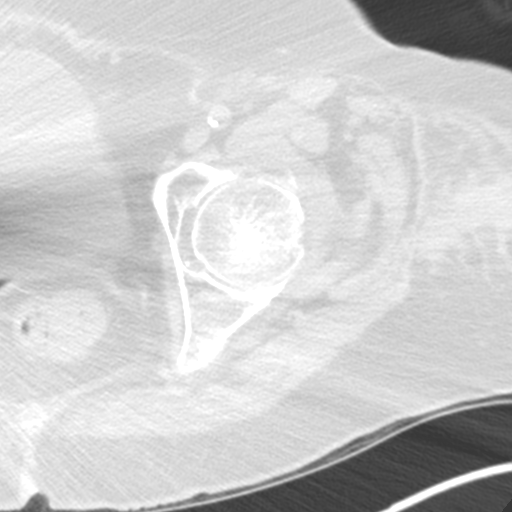
[im 26/30  soft-tissue]
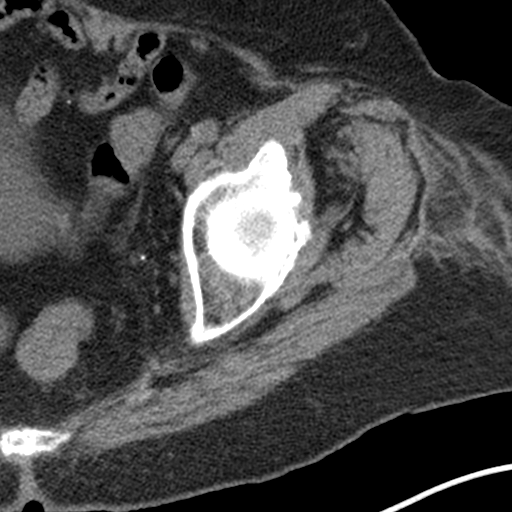
[im 26/30  lung]
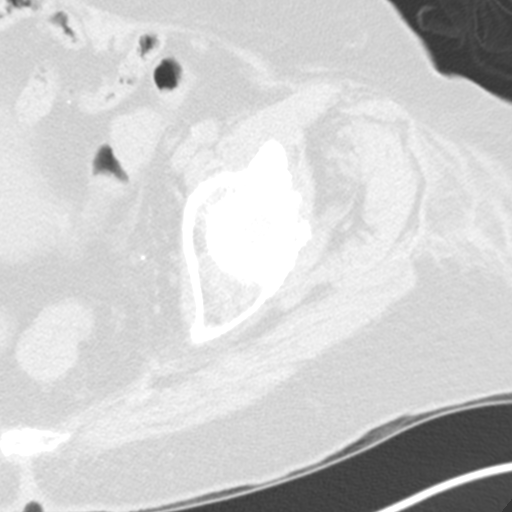

[Series 5: coronal bone · coronal · 0.31mm/px · 3 of 72 slices shown]
[im 24/72  soft-tissue]
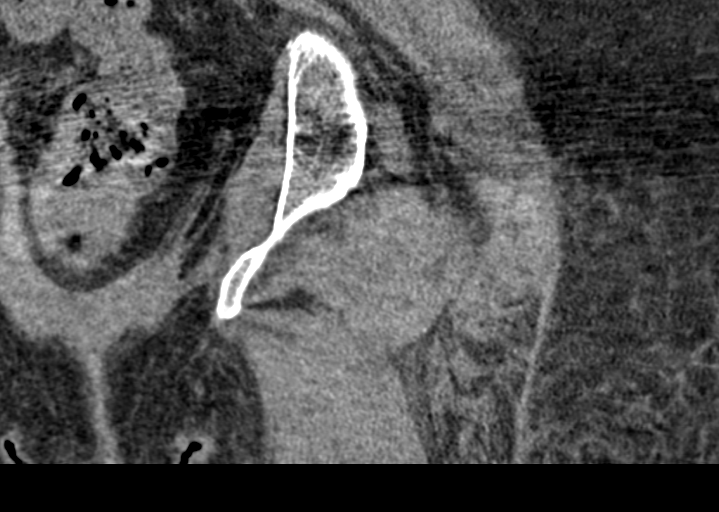
[im 32/72  soft-tissue]
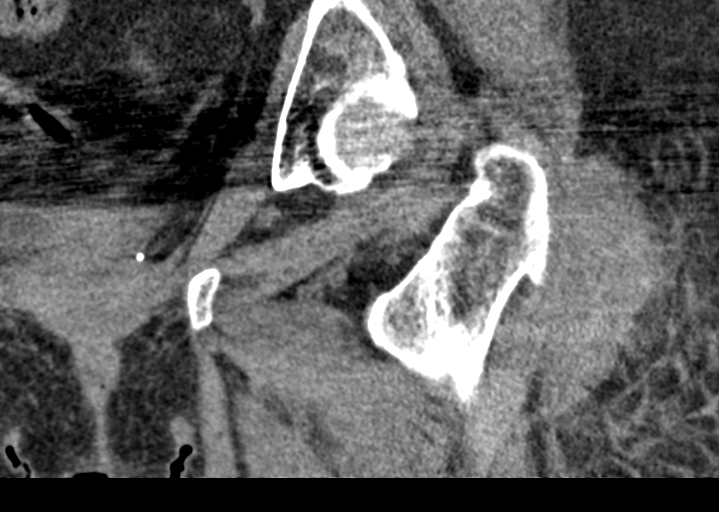
[im 40/72  soft-tissue]
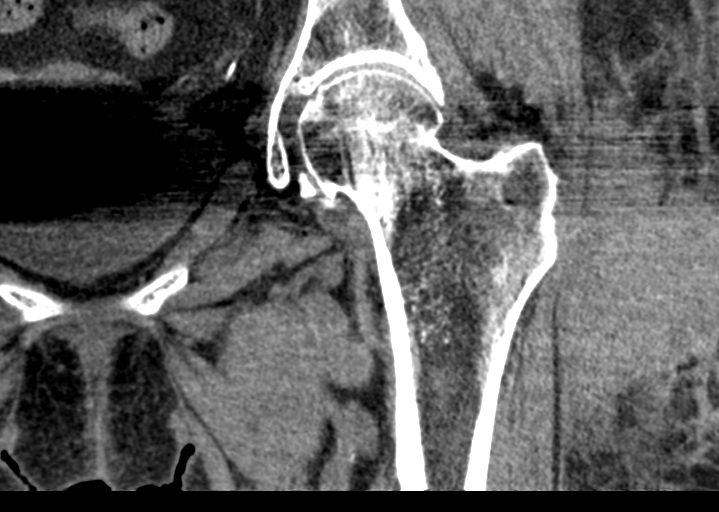

[Series 6: sagittal bone · sagittal · 0.29mm/px · 1 of 114 slices shown]
[im 38/114  soft-tissue]
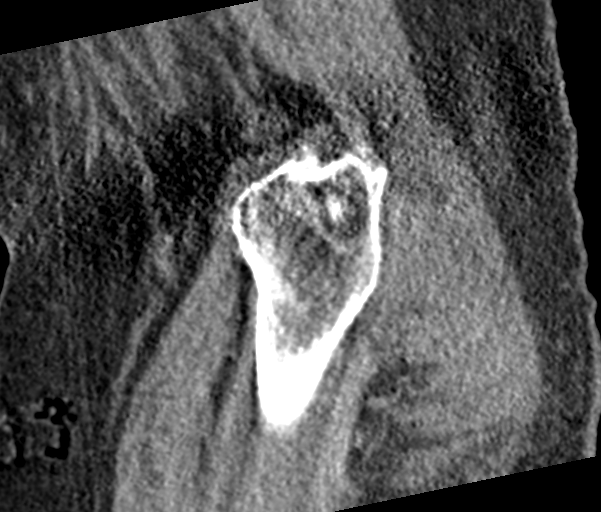

[11 of 46 positions shown; findings below may reference images not displayed]

FINDINGS: Bones/Joint/Cartilage

Cortical irregularity along the lateral aspect of the left femoral
head appears new from prior studies in [REDACTED] and [place]. This
raises suspicion for a poorly characterized subcapital fracture
through the left femoral neck.

Mild osteophyte formation is noted about the left hip joint. The
cartilage is not well assessed. No significant hip joint effusion is
seen.

Ligaments

Suboptimally assessed by CT.

Muscles and Tendons

The visualized musculature is grossly unremarkable in appearance. No
focal tendon abnormalities are seen.

Soft tissues

Note is made of a prominent focal soft tissue hematoma overlying the
left greater femoral trochanter, measuring at least 8 cm in size,
with associated soft tissue injury tracking along the proximal left
thigh.

The bladder is mildly distended. Visualized small and large bowel
loops are grossly unremarkable.
IMPRESSION: 1. Cortical irregularity along the lateral aspect of the left
femoral head appears new from prior studies in [REDACTED] in [REDACTED].
This raises suspicion for a poorly characterized subcapital fracture
through the left femoral neck. MRI could be considered as deemed
clinically appropriate.
2. Mild osteophyte formation about the left hip joint.
3. Prominent focal soft tissue hematoma overlying the left greater
femoral trochanter, measuring at least 8 cm in size, with associated
soft tissue injury tracking along the proximal left thigh.

## 2017-11-30 NOTE — Progress Notes (Signed)
Speech Language Pathology Treatment: Dysphagia  Patient Details Name: DELINAH HUMBERT MRN: [id number] DOB: 1921-01-25 Today's Date: 11/30/2017 Time: 4098-1191 SLP Time Calculation (min) (ACUTE ONLY): 23 min  Assessment / Plan / Recommendation Clinical Impression  Pt demonstrates significant improvement in arousal and attention to PO. Tolerates thin liquids in isolation without signs of aspiration, able to self feed. Also demonstrates adequate mastication while self feeding corn flakes, though she did have cough x2 with the mixed consistency of milk and cereal, likely due to some mild spill of liquids while chewing. This demonstrates pts sensation of airway penetrate. Would recommend pt initiate a regular diet and thin liquids, though she may benefit from avoiding mixed textures (liquid and solid together). WIll follow for tolerance.   HPI        SLP Plan  Continue with current plan of care       Recommendations  Diet recommendations: Regular;Thin liquid Liquids provided via: Cup;Straw Medication Administration: Whole meds with puree Supervision: (set up assist) Compensations: Slow rate;Small sips/bites;Minimize environmental distractions Postural Changes and/or Swallow Maneuvers: Seated upright 90 degrees                Oral Care Recommendations: Oral care BID Follow up Recommendations: Skilled Nursing facility SLP Visit Diagnosis: Dysphagia, unspecified (R13.10) Plan: Continue with current plan of care       GO               [facility] [place] CCC-SLP [phone]  Claudine Mouton 11/30/2017, 8:32 AM

## 2017-11-30 NOTE — Evaluation (Addendum)
Physical Therapy Evaluation/Discharge Patient Details Name: LACHAE RHYNES MRN: [id number] DOB: 06-Jun-1920 Today's Date: 11/30/2017   History of Present Illness  [name] is a 84 y.o. female with medical history significant of hypertension, asthma, hypothyroidism, dementia, depression, anxiety, anemia, urinary tract infection, resident of [facility] presented to the ED with lethargyPt dx with acute metabolic encepholopathy likely due to UTI.    Clinical Impression  Pt is total assist for EOB and only able to sit a few minutes due to pain in multiple areas of her body begging PT to let her return to supine.  She is unable to report how mobile she was PTA, but is appropriate for SNF at discharge.   PT will defer any further therapy to SNF.     Follow Up Recommendations SNF    Equipment Recommendations  Hospital bed;Other (comment)(hoyer lift)    Recommendations for Other Services   NA    Precautions / Restrictions Precautions Precautions: Fall Restrictions Weight Bearing Restrictions: No      Mobility  Bed Mobility Overal bed mobility: Needs Assistance Bed Mobility: Rolling;Supine to Sit;Sit to Supine Rolling: +2 for physical assistance;Total assist   Supine to sit: +2 for physical assistance;Total assist;HOB elevated Sit to supine: +2 for physical assistance;Total assist   General bed mobility comments: Two person total assist to roll, move to EOB.  Pt resisting movement and crying out anxiously in pain. Reporting pain mostly at back bil hips and bil knees.  Crying out to be let back down to supine.   Transfers                 General transfer comment: unable   Ambulation/Gait             General Gait Details: unable         Balance Overall balance assessment: Needs assistance Sitting-balance support: Bilateral upper extremity supported;Feet unsupported Sitting balance-Leahy Scale: Zero Sitting balance - Comments: total assist EOB and pt  pushing posteriorly due to pain. Bil LEs unable to flex and touch the floor in sitting and pt sat EOB three minutes begging PT to let her return to supine.  Postural control: Posterior lean                                   Pertinent Vitals/Pain Pain Assessment: Faces Pain Score: 3  Faces Pain Scale: Hurts worst Pain Location: back, groin, knees bil with mobility to EOB.  Pain Descriptors / Indicators: Crying Pain Intervention(s): Limited activity within patient's tolerance;Monitored during session;Repositioned    Home Living Family/patient expects to be discharged to:: Skilled nursing facility               Home Equipment: Walker - 2 wheels Additional Comments: pt unable to report clear history    Prior Function Level of Independence: Needs assistance               Hand Dominance   Dominant Hand: Right    Extremity/Trunk Assessment   Upper Extremity Assessment Upper Extremity Assessment: Defer to OT evaluation    Lower Extremity Assessment Lower Extremity Assessment: RLE deficits/detail;LLE deficits/detail RLE Deficits / Details: bil LE with signs that she has likely not stood recently including very tight ankle DF ROM (lacking ~10 degrees bil), limited knee extension ROM by ~15 degrees bil.   LLE Deficits / Details: bil LE with signs that she has likely not stood  recently including very tight ankle DF ROM (lacking ~10 degrees bil), limited knee extension ROM by ~15 degrees bil.      Cervical / Trunk Assessment Cervical / Trunk Assessment: Kyphotic(with reports of back pain at rest and with mobility. )  Communication   Communication: HOH  Cognition Arousal/Alertness: Awake/alert Behavior During Therapy: Anxious Overall Cognitive Status: History of cognitive impairments - at baseline                                 General Comments: unsure of baseline, at times makes sense, and at others tangental vs not hearing what we asked.               Assessment/Plan    PT Assessment All further PT needs can be met in the next venue of care  PT Problem List Decreased strength;Decreased range of motion;Decreased activity tolerance;Decreased balance;Decreased mobility;Decreased coordination;Decreased cognition;Decreased knowledge of use of DME;Decreased safety awareness;Decreased knowledge of precautions;Pain;Decreased skin integrity       PT Treatment Interventions      PT Goals (Current goals can be found in the Care Plan section)  Acute Rehab PT Goals Patient Stated Goal: to decrease pain PT Goal Formulation: All assessment and education complete, DC therapy               AM-PAC PT "6 Clicks" Daily Activity  Outcome Measure Difficulty turning over in bed (including adjusting bedclothes, sheets and blankets)?: Unable Difficulty moving from lying on back to sitting on the side of the bed? : Unable Difficulty sitting down on and standing up from a chair with arms (e.g., wheelchair, bedside commode, etc,.)?: Unable Help needed moving to and from a bed to chair (including a wheelchair)?: Total Help needed walking in hospital room?: Total Help needed climbing 3-5 steps with a railing? : Total 6 Click Score: 6    End of Session   Activity Tolerance: Patient limited by pain Patient left: in bed;with call bell/phone within reach   PT Visit Diagnosis: Muscle weakness (generalized) (M62.81);Difficulty in walking, not elsewhere classified (R26.2);Pain Pain - Right/Left: (bil) Pain - part of body: Knee    Time: 2841-3244 PT Time Calculation (min) (ACUTE ONLY): 32 min   Charges:         Lurena Joiner B. Linea Calles, PT, DPT [phone]   PT Evaluation $PT Eval Moderate Complexity: 1 Mod PT Treatments $Therapeutic Activity: 8-22 mins   11/30/2017, 4:02 PM

## 2017-11-30 NOTE — Evaluation (Signed)
Occupational Therapy Evaluation and Defer to SNF Patient Details Name: [name] MRN: [id number] DOB: 30-Jun-1920 Today's Date: 11/30/2017    History of Present Illness [name] is a 84 y.o. female with medical history significant of hypertension, asthma, hypothyroidism, dementia, depression, anxiety, anemia, urinary tract infection, resident of [facility] presented to the ED with lethargyPt dx with acute metabolic encepholopathy likely due to UTI.     Clinical Impression   PTA Pt dependent in transfers, and all ADL except self feeding. Depending on cognition (as it returns) Pt has the physical ability to bring hand to mouth for self-feeding. Pt seen by PT as total A +2 with significant pain for bed mobility. OT will defer further services to SNF where she is in familiar environment. Thank you for the opportunity to serve this patient.     Follow Up Recommendations  SNF;Supervision/Assistance - 24 hour    Equipment Recommendations  Other (comment)(defer to next venue)    Recommendations for Other Services       Precautions / Restrictions Precautions Precautions: Fall Restrictions Weight Bearing Restrictions: No      Mobility Bed Mobility Overal bed mobility: Needs Assistance Bed Mobility: Rolling;Supine to Sit;Sit to Supine Rolling: +2 for physical assistance;Total assist   Supine to sit: +2 for physical assistance;Total assist;HOB elevated Sit to supine: +2 for physical assistance;Total assist   General bed mobility comments: recently assessed with PT to great pain, NT this session  Transfers                 General transfer comment: unable     Balance Overall balance assessment: Needs assistance Sitting-balance support: Bilateral upper extremity supported;Feet unsupported Sitting balance-Leahy Scale: Zero Sitting balance - Comments: total assist EOB and pt pushing posteriorly due to pain. Bil LEs unable to flex and touch the floor in sitting and  pt sat EOB three minutes begging PT to let her return to supine.  Postural control: Posterior lean                                 ADL either performed or assessed with clinical judgement   ADL Overall ADL's : At baseline                                       General ADL Comments: at baseline, Pt is dependent in all ADL - Pt can bring hand to mouth for self-feeding     Vision         Perception     Praxis      Pertinent Vitals/Pain Pain Assessment: Faces Faces Pain Scale: Hurts a little bit Pain Location: back Pain Descriptors / Indicators: Discomfort Pain Intervention(s): Monitored during session;Limited activity within patient's tolerance     Hand Dominance Right   Extremity/Trunk Assessment Upper Extremity Assessment Upper Extremity Assessment: Generalized weakness   Lower Extremity Assessment Lower Extremity Assessment: RLE deficits/detail;LLE deficits/detail RLE Deficits / Details: bil LE with signs that she has likely not stood recently including very tight ankle DF ROM (lacking ~10 degrees bil), limited knee extension ROM by ~15 degrees bil.   LLE Deficits / Details: bil LE with signs that she has likely not stood recently including very tight ankle DF ROM (lacking ~10 degrees bil), limited knee extension ROM by ~15 degrees bil.  Cervical / Trunk Assessment Cervical / Trunk Assessment: Kyphotic   Communication Communication Communication: HOH   Cognition Arousal/Alertness: Awake/alert Behavior During Therapy: WFL for tasks assessed/performed Overall Cognitive Status: No family/caregiver present to determine baseline cognitive functioning                                 General Comments: unsure of baseline, at times makes sense, and at others tangental vs not hearing what we asked.    General Comments       Exercises     Shoulder Instructions      Home Living Family/patient expects to be discharged to::  Skilled nursing facility                             Home Equipment: Wheelchair - manual   Additional Comments: Spoke with Interior and spatial designer of memory care unit where [name] she uses a WC during the day and does not walk but IS NOT bed bound      Prior Functioning/Environment Level of Independence: Needs assistance  Gait / Transfers Assistance Needed: dependent in transfers for [facility] and toilet needs ADL's / Homemaking Assistance Needed: dependent in all ADL except self-feeding   Comments: history from [employer] - at [facility] where [name] is a resident        OT Problem List: Impaired balance (sitting and/or standing);Decreased activity tolerance;Pain      OT Treatment/Interventions:      OT Goals(Current goals can be found in the care plan section) Acute Rehab OT Goals Patient Stated Goal: to decrease pain OT Goal Formulation: Patient unable to participate in goal setting  OT Frequency:     Barriers to D/C:            Co-evaluation              AM-PAC PT "6 Clicks" Daily Activity     Outcome Measure Help from another person eating meals?: A Little Help from another person taking care of personal grooming?: Total Help from another person toileting, which includes using toliet, bedpan, or urinal?: Total Help from another person bathing (including washing, rinsing, drying)?: Total Help from another person to put on and taking off regular upper body clothing?: Total Help from another person to put on and taking off regular lower body clothing?: Total 6 Click Score: 8   End of Session Nurse Communication: Mobility status  Activity Tolerance: Patient limited by pain Patient left: in bed;with call bell/phone within reach;with bed alarm set  OT Visit Diagnosis: Pain;Other symptoms and signs involving cognitive function Pain - part of body: (back)                Time: 3235-5732 OT Time Calculation (min): 10 min Charges:  OT General  Charges $OT Visit: 1 Visit OT Evaluation $OT Eval Moderate Complexity: 1 Mod G-Codes:     Sherryl Manges OTR/L [phone]  Evern Bio Felton Buczynski 11/30/2017, 5:48 PM

## 2017-11-30 NOTE — Progress Notes (Signed)
PROGRESS NOTE    [name]  ZOX:[id number] DOB: [birth year] DOA: 11/27/2017 PCP: Ron Parker, MD  Brief Narrative: [name] is a 84 y.o. female with medical history significant of hypertension, asthma, hypothyroidism, dementia, depression, anxiety, anemia, urinary tract infection, resident of [facility] presented to the ED with lethargy  Assessment & Plan:   Principal Problem:   Acute metabolic encephalopathy -suspect secondary to urinary infection in the background of advanced dementia -improving, she is alert awake, oriented to self and partially to person now, but pleasantly confused  -CT head notable for atrophy -LFTs, ammonia level unremarkable -TSH is mildly elevated, increased Synthroid dose -Reportedly had some abdominal tenderness in ED, CT abdomen pelvis was unremarkable -SLP evaluation completed to finally to start diet today, stop IVF    Dementia -at baseline on Klonopin-0.25 mg twice a day, Depakote-125 mg 3 times a day-suspect due to behavioral/mood problems in the setting of dementia -Exelon 1.5 mg twice a day and risperidone 0.5 mg daily PRN for agitation -restart Exelon and Klonopin as daily at bedtime    Aerococcus urinae UTI (urinary tract infection) -history unreliable, in the setting of confusion, leukocytosis and abnormal urinalysis -Continue ceftriaxone, change to amoxicillin at DC tomorrow    Depression -continue Lexapro 5 mg daily    Hypothyroidism -TSH is abnormal, increased Synthroid dose to 75 g  DVT prophylaxis:Lovenox Code Status: DO NOT RESUSCITATE Family Communication:no family at bedside Disposition Plan: back to SNF tomorrow if stable Consultants:      Procedures:   Antimicrobials:    Subjective: -feels okay, no specific complaints, pleasantly confused, 1 state  Objective: Vitals:   11/29/17 2346 11/30/17 0355 11/30/17 0852 11/30/17 1208  BP: (!) 157/63 (!) 155/69 (!) 146/55 (!) 135/51  Pulse: 77 72 75 71    Resp: 16 18 18 18   Temp: 98.1 F (36.7 C) 99.2 F (37.3 C) 97.6 F (36.4 C) 98.8 F (37.1 C)  TempSrc: Oral Oral Oral Oral  SpO2: 94% 94% 96% 95%  Weight:        Intake/Output Summary (Last 24 hours) at 11/30/2017 1352 Last data filed at 11/30/2017 0514 Gross per 24 hour  Intake 1050 ml  Output 2400 ml  Net -1350 ml   Filed Weights   11/28/17 0250  Weight: 87.1 kg (192 lb 0.3 oz)    Examination:  Gen: elderly frailfemale pleasantly confused, in no distress HEENT: PERRLA, Neck supple, no JVD Lungs: decreased breath sounds at both bases CVS: S1-S2/regular rate rhythm Abd: soft, Non tender, non distended, BS present Extremities: No Cyanosis, Clubbing or edema Skin: no new rashes Psychiatry: mood & affect appropriate.     Data Reviewed:   CBC: Recent Labs  Lab 11/27/17 2058 11/28/17 0258 11/29/17 0610 11/30/17 0630  WBC 11.8* 14.0* 10.7* 10.8*  HGB 12.5 12.7 11.6* 12.1  HCT 38.0 38.8 36.1 37.3  MCV 90.9 90.9 90.7 90.5  PLT 201 201 197 192   Basic Metabolic Panel: Recent Labs  Lab 11/27/17 2058 11/28/17 0258 11/29/17 0610 11/30/17 0630  NA 143 144 141 139  K 4.2 4.4 3.9 3.9  CL 107 107 104 106  CO2 28 30 27 26   GLUCOSE 116* 121* 96 101*  BUN 18 17 11 13   CREATININE 0.77 0.82 0.79 0.86  CALCIUM 9.5 9.7 9.0 9.0   GFR: CrCl cannot be calculated (Unknown ideal weight.). Liver Function Tests: Recent Labs  Lab 11/27/17 2058  AST 21  ALT 27  ALKPHOS 98  BILITOT 0.5  PROT 6.7  ALBUMIN 3.1*   Recent Labs  Lab 11/28/17 0258  LIPASE 29   Recent Labs  Lab 11/27/17 2109  AMMONIA 31   Coagulation Profile: No results for input(s): INR, PROTIME in the last 168 hours. Cardiac Enzymes: No results for input(s): CKTOTAL, CKMB, CKMBINDEX, TROPONINI in the last 168 hours. BNP (last 3 results) No results for input(s): PROBNP in the last 8760 hours. HbA1C: No results for input(s): HGBA1C in the last 72 hours. CBG: Recent Labs  Lab 11/27/17 2039  11/28/17 2227  GLUCAP 116* 110*   Lipid Profile: No results for input(s): CHOL, HDL, LDLCALC, TRIG, CHOLHDL, LDLDIRECT in the last 72 hours. Thyroid Function Tests: Recent Labs    11/27/17 2109  TSH 5.684*  FREET4 1.15   Anemia Panel: No results for input(s): VITAMINB12, FOLATE, FERRITIN, TIBC, IRON, RETICCTPCT in the last 72 hours. Urine analysis:    Component Value Date/Time   COLORURINE YELLOW 11/27/2017 2146   APPEARANCEUR HAZY (A) 11/27/2017 2146   LABSPEC 1.014 11/27/2017 2146   PHURINE 5.0 11/27/2017 2146   GLUCOSEU NEGATIVE 11/27/2017 2146   GLUCOSEU NEGATIVE 04/06/2014 1127   HGBUR LARGE (A) 11/27/2017 2146   BILIRUBINUR NEGATIVE 11/27/2017 2146   BILIRUBINUR small 03/14/2014 1151   KETONESUR 5 (A) 11/27/2017 2146   PROTEINUR NEGATIVE 11/27/2017 2146   UROBILINOGEN 0.2 03/23/2015 0053   NITRITE NEGATIVE 11/27/2017 2146   LEUKOCYTESUR NEGATIVE 11/27/2017 2146   Sepsis Labs: @LABRCNTIP (procalcitonin:4,lacticidven:4)  ) Recent Results (from the past 240 hour(s))  Urine culture     Status: Abnormal   Collection Time: 11/27/17  9:45 PM  Result Value Ref Range Status   Specimen Description URINE, CATHETERIZED  Final   Special Requests NONE  Final   Culture (A)  Final    >=100,000 COLONIES/mL AEROCOCCUS URINAE Standardized susceptibility testing for this organism is not available. Performed at [facility], [address] [place], [place] [address]    Report Status 11/30/2017 FINAL  Final  Culture, blood (Routine X 2) w Reflex to ID Panel     Status: None (Preliminary result)   Collection Time: 11/28/17  2:58 AM  Result Value Ref Range Status   Specimen Description BLOOD RIGHT ANTECUBITAL  Final   Special Requests   Final    BOTTLES DRAWN AEROBIC ONLY Blood Culture adequate volume   Culture   Final    NO GROWTH 2 DAYS Performed at [facility], [address] [place], [place] [address]    Report Status PENDING  Incomplete  Culture, blood  (Routine X 2) w Reflex to ID Panel     Status: None (Preliminary result)   Collection Time: 11/28/17  3:02 AM  Result Value Ref Range Status   Specimen Description BLOOD LEFT ARM  Final   Special Requests   Final    BOTTLES DRAWN AEROBIC ONLY Blood Culture adequate volume   Culture   Final    NO GROWTH 2 DAYS Performed at [facility], [address] [place], [place] [address]    Report Status PENDING  Incomplete         Radiology Studies: No results found.      Scheduled Meds: . clonazePAM  0.25 mg Oral QHS  . enoxaparin (LOVENOX) injection  40 mg Subcutaneous Q24H  . escitalopram  5 mg Oral Daily  . levothyroxine  75 mcg Oral QAC breakfast  . nystatin cream   Topical BID  . rivastigmine  1.5 mg Oral BID  Continuous Infusions: . cefTRIAXone (ROCEPHIN)  IV 1 g (11/30/17 1047)     LOS: 2 days    Time spent:    Zannie Cove, MD [facility] Page via [email or web address], password [email or web address] After 7PM please contact night-coverage  11/30/2017, 1:52 PM

## 2017-12-01 LAB — BASIC METABOLIC PANEL
ANION GAP: 9 (ref 5–15)
BUN: 21 mg/dL (ref 8–23)
CO2: 26 mmol/L (ref 22–32)
Calcium: 8.9 mg/dL (ref 8.9–10.3)
Chloride: 105 mmol/L (ref 98–111)
Creatinine, Ser: 0.9 mg/dL (ref 0.44–1.00)
GFR, EST NON AFRICAN AMERICAN: 53 mL/min — AB (ref >60)
Glucose, Bld: 95 mg/dL (ref 70–99)
POTASSIUM: 3.9 mmol/L (ref 3.5–5.1)
Sodium: 140 mmol/L (ref 135–145)

## 2017-12-01 LAB — CBC
HEMATOCRIT: 34.2 % — AB (ref 36.0–46.0)
Hemoglobin: 11.2 g/dL — ABNORMAL LOW (ref 12.0–15.0)
MCH: 30 pg (ref 26.0–34.0)
MCHC: 32.7 g/dL (ref 30.0–36.0)
MCV: 91.7 fL (ref 78.0–100.0)
Platelets: 181 10*3/uL (ref 150–400)
RBC: 3.73 MIL/uL — AB (ref 3.87–5.11)
RDW: 13.9 % (ref 11.5–15.5)
WBC: 9.3 10*3/uL (ref 4.0–10.5)

## 2017-12-01 MED ORDER — AMOXICILLIN 500 MG PO TABS: 500.0000 mg | ORAL_TABLET | Freq: Two times a day (BID) | ORAL | 0 refills | Status: DC

## 2017-12-01 MED ORDER — LEVOTHYROXINE SODIUM 75 MCG PO TABS: 75.0000 ug | ORAL_TABLET | Freq: Every day | ORAL | 0 refills | Status: AC

## 2017-12-01 MED ORDER — CLONAZEPAM 0.5 MG PO TABS: 0.2500 mg | ORAL_TABLET | Freq: Two times a day (BID) | ORAL | 0 refills | Status: AC

## 2017-12-01 MED ORDER — DIVALPROEX SODIUM 125 MG PO CSDR: 125.0000 mg | DELAYED_RELEASE_CAPSULE | Freq: Two times a day (BID) | ORAL | Status: AC

## 2017-12-01 MED ORDER — TRAZODONE HCL 50 MG PO TABS: 50.0000 mg | ORAL_TABLET | Freq: Every evening | ORAL | Status: AC | PRN

## 2017-12-01 NOTE — Consult Note (Signed)
[facility] CM Primary Care Navigator  12/01/2017  [name] 1920/07/13 [id number]   Went to see patient in the room to identify possible discharge needs but patient is only oriented to self. Attempted to call patient's son Leonette Most) times 4 but was unable to reach him, left message on his voicemail but unable to receive any response.  Per chart review, patient was admitted with acute metabolic encephalopathy. She is a resident at [facility].              PT/ OT recommends skilled nursing facility (SNF) for discharge.   Per Inpatient CM note, anticipated plan for discharge is to return back to [facility] facility today with orders for home health PT.   Patient has discharge instruction to follow-up with primary care provider- PCP (Dr. Ron Parker- Epic) in 1 week, and to consider Palliative care evaluation at Assisted Living Facility/ Memory care Unit.  Called [facility]) but unable to verify if Dr. Ron Parker (listed as patient's PCP in Epic) is seeing patient at the facility.  APL (All Payers List) and KPN reflects Dr. Hillard Danker with [facility] as the primary care provider for patient. Called Dr. Karie Fetch office and spoke to [place] who stated that patient was last seen by Dr. Okey Dupre in April 2016 (currently,not an active patient) and their records show that on June 2016 patient was transferred to [facility].    For additional questions please contact:  Karin Golden A. Reeda Soohoo, BSN, RN-BC [facility] PRIMARY CARE Navigator Cell: [phone]

## 2017-12-01 NOTE — Discharge Summary (Addendum)
Physician Discharge Summary  [name] ZOX:[id number] DOB: 1920-09-14 DOA: 11/27/2017  PCP: Ron Parker, MD  Admit date: 11/27/2017 Discharge date: 12/01/2017  Time spent: 35 minutes  Recommendations for Outpatient Follow-up:  PCP in 1 week, please consider Palliative care eval at ALF/Memory care Unit Home health PT  Discharge Diagnoses:  Principal Problem:   Acute metabolic encephalopathy   Hypertension   Hypothyroidism   Dementia   UTI (urinary tract infection)   Depression   Discharge Condition: improved  Diet recommendation: regular diet/thin liquids  Filed Weights   11/28/17 0250  Weight: 87.1 kg (192 lb 0.3 oz)    History of present illness:  Deborah Knape Bameis a 84 y.o.femalewith medical history significant ofhypertension, asthma, hypothyroidism, dementia, depression, anxiety, anemia, urinary tract infection, resident of [facility] presented to the ED with lethargy  Hospital Course:  Acute metabolic encephalopathy -suspect secondary to urinary infection in the background of advanced dementia -improving, she is alert awake, oriented to self and partly to person now, but pleasantly confused  -CT head notable for atrophy -LFTs, ammonia level unremarkable -TSH is mildly elevated, increased Synthroid dose -Reportedly had some abdominal tenderness in ED, CT abdomen pelvis was checked on admission and was unremarkable -SLP evaluation completed to finally started diet yesterday -recommend Palliative care evaluation at the facility    Dementia -at baseline on Klonopin-0.25 mg twice a day, Depakote-125 mg 3 times a day-suspect due to behavioral/mood problems in the setting of dementia -She takes Exelon 1.5 mg twice a day and risperidone 0.5 mg daily PRN for agitation -restarted Exelon and Klonopin at low dose, depakote cut down to BID    Aerococcus urinae UTI (urinary tract infection) -history unreliable, in the setting of confusion, leukocytosis and  abnormal urinalysis -Urine Cx grew Aerococcus urinae -treated with IV  ceftriaxone, change to amoxicillin at DC today for days    Depression -continue Lexapro 5 mg daily    Hypothyroidism -TSH was high/abnormal, increased Synthroid dose to 75 g -recheck TSH in 2-4months     Discharge Exam: Vitals:   12/01/17 0412 12/01/17 0859  BP: (!) 152/60 (!) 121/56  Pulse: 66 67  Resp: 16 20  Temp: 98.2 F (36.8 C) 98.4 F (36.9 C)  SpO2: 96% 96%    General: AAOx1 Cardiovascular: S1S2/RRR Respiratory: CTAB  Discharge Instructions   Discharge Instructions    Diet - low sodium heart healthy   Complete by:  As directed    Increase activity slowly   Complete by:  As directed      Allergies as of 12/01/2017      Reactions   Morphine And Related Other (See Comments)   Unknown reaction   Codeine Rash   Hydrocodone Rash      Medication List    STOP taking these medications   loperamide 2 MG capsule Commonly known as:  IMODIUM   magnesium hydroxide 400 MG/5ML suspension Commonly known as:  MILK OF MAGNESIA   Melatonin 3 MG Tabs   MINTOX 200-200-20 MG/5ML suspension Generic drug:  alum & mag hydroxide-simeth   traMADol 50 MG tablet Commonly known as:  ULTRAM     TAKE these medications   acetaminophen 500 MG tablet Commonly known as:  TYLENOL Take 500 mg by mouth every 4 (four) hours as needed for mild pain, moderate pain, fever or headache.   amoxicillin 500 MG tablet Commonly known as:  AMOXIL Take 1 tablet (500 mg total) by mouth 2 (two) times daily. For 2days  aspirin 81 MG chewable tablet Chew 81 mg daily by mouth.   clonazePAM 0.5 MG tablet Commonly known as:  KLONOPIN Take 0.5 tablets (0.25 mg total) by mouth 2 (two) times daily.   divalproex 125 MG capsule Commonly known as:  DEPAKOTE SPRINKLE Take 1 capsule (125 mg total) by mouth 2 (two) times daily. What changed:  when to take this   escitalopram 5 MG tablet Commonly known as:   LEXAPRO Take 5 mg daily by mouth.   guaifenesin 100 MG/5ML syrup Commonly known as:  ROBITUSSIN Take 200 mg by mouth every 6 (six) hours as needed for cough.   levothyroxine 75 MCG tablet Commonly known as:  SYNTHROID, LEVOTHROID Take 1 tablet (75 mcg total) by mouth daily before breakfast. What changed:    medication strength  how much to take   multivitamin with minerals tablet Take 1 tablet daily with breakfast by mouth. "Akorn/s antioxidant"   NUTRITIONAL DRINK Liqd Take 1 Bottle by mouth 3 (three) times daily with meals. *Mighty Shakes*   risperiDONE 0.5 MG tablet Commonly known as:  RISPERDAL Take 0.5 mg daily as needed by mouth (severe agitation/anxiety).   rivastigmine 1.5 MG capsule Commonly known as:  EXELON Take 1.5 mg by mouth 2 (two) times daily.   senna 8.6 MG Tabs tablet Commonly known as:  SENOKOT Take 17.2 mg by mouth 2 (two) times daily.   traZODone 50 MG tablet Commonly known as:  DESYREL Take 1 tablet (50 mg total) by mouth at bedtime as needed for sleep. What changed:    when to take this  reasons to take this   TRIPLE ANTIBIOTIC [phone] Oint Apply 1 application 3 (three) times daily as needed topically (skin tears/abrasians).      Allergies  Allergen Reactions  . Morphine And Related Other (See Comments)    Unknown reaction  . Codeine Rash  . Hydrocodone Rash   Follow-up Information    Ron Parker, MD. Schedule an appointment as soon as possible for a visit in 1 week(s).   Specialty:  Internal Medicine Contact information: [address] [place] [address] [phone]            The results of significant diagnostics from this hospitalization (including imaging, microbiology, ancillary and laboratory) are listed below for reference.    Significant Diagnostic Studies: Ct Head Wo Contrast  Result Date: 11/27/2017 CLINICAL DATA:  Altered level of consciousness EXAM: CT HEAD WITHOUT CONTRAST TECHNIQUE: Contiguous  axial images were obtained from the base of the skull through the vertex without intravenous contrast. COMPARISON:  04/13/2017 FINDINGS: Brain: Involutional changes the brain, likely age related with mild-to-moderate small vessel ischemic disease of periventricular and subcortical white matter. Right basal ganglial and left caudate lacunar infarcts are noted. No large vascular territory infarction, hemorrhage or midline shift. No intra-axial mass nor extra-axial fluid collections. Vascular: No hyperdense vessel or unexpected calcifications. Skull: No skull fracture or significant calvarial swelling. Sinuses/Orbits: Minimal mucosal thickening of the ethmoid and right maxillary sinuses. Bilateral lens replacements. Other: None IMPRESSION: Atrophy with chronic small vessel ischemia. No acute intracranial abnormality. Electronically Signed   By: Tollie Eth M.D.   On: 11/27/2017 23:03   Ct Abdomen Pelvis W Contrast  Result Date: 11/28/2017 CLINICAL DATA:  Acute abdominal pain, generalized EXAM: CT ABDOMEN AND PELVIS WITH CONTRAST TECHNIQUE: Multidetector CT imaging of the abdomen and pelvis was performed using the standard protocol following bolus administration of intravenous contrast. CONTRAST:  OMNIPAQUE IOHEXOL 300 MG/ML  SOLN COMPARISON:  None. FINDINGS: Lower chest: Streaky opacity at the right base consistent with mild atelectasis. Hepatobiliary: Tiny low-density in the central right liver is too small for densitometry.Cholecystectomy. Reservoir effect and age accounts for bile duct prominence. Pancreas: Generalized atrophy Spleen: Unremarkable. Adrenals/Urinary Tract: Negative adrenals. Branching left lower renal calculus. Asymmetric left renal atrophy. Renal sinus cysts on the right. No hydronephrosis. Moderately distended bladder without wall thickening Stomach/Bowel: No obstruction. No inflammatory changes. Lipomatosis hypertrophy at the ileocecal valve. Vascular/Lymphatic: Atherosclerosis. No acute  finding. no mass or adenopathy. Reproductive:Hysterectomy Other: No ascites or pneumoperitoneum. Musculoskeletal: Right hip hemiarthroplasty. Disc and facet degeneration with L4-5 anterolisthesis. L3-4 and L4-5 remote laminectomy. No acute osseous finding. IMPRESSION: 1. No acute finding. 2. Moderately distended urinary bladder, please ensure no urinary retention. 3. Branching left renal calculus. 4.  Aortic Atherosclerosis (ICD10-I70.0). Electronically Signed   By: Marnee SpringJonathon  Watts M.D.   On: 11/28/2017 08:21   Dg Chest Port 1 View  Result Date: 11/27/2017 CLINICAL DATA:  Allergic reaction. EXAM: PORTABLE CHEST 1 VIEW COMPARISON:  April 13, 2017 FINDINGS: The heart size and mediastinal contours are within normal limits. Both lungs are clear. The visualized skeletal structures are unremarkable. IMPRESSION: No active disease. Electronically Signed   By: Gerome Samavid  Williams III M.D   On: 11/27/2017 21:06    Microbiology: Recent Results (from the past 240 hour(s))  Urine culture     Status: Abnormal   Collection Time: 11/27/17  9:45 PM  Result Value Ref Range Status   Specimen Description URINE, CATHETERIZED  Final   Special Requests NONE  Final   Culture (A)  Final    >=100,000 COLONIES/mL AEROCOCCUS URINAE Standardized susceptibility testing for this organism is not available. Performed at [facility], [address] [place], [place] [address]    Report Status 11/30/2017 FINAL  Final  Culture, blood (Routine X 2) w Reflex to ID Panel     Status: None (Preliminary result)   Collection Time: 11/28/17  2:58 AM  Result Value Ref Range Status   Specimen Description BLOOD RIGHT ANTECUBITAL  Final   Special Requests   Final    BOTTLES DRAWN AEROBIC ONLY Blood Culture adequate volume   Culture   Final    NO GROWTH 2 DAYS Performed at [facility], [address] [place], [place] [address]    Report Status PENDING  Incomplete  Culture, blood (Routine X 2) w Reflex to ID Panel      Status: None (Preliminary result)   Collection Time: 11/28/17  3:02 AM  Result Value Ref Range Status   Specimen Description BLOOD LEFT ARM  Final   Special Requests   Final    BOTTLES DRAWN AEROBIC ONLY Blood Culture adequate volume   Culture   Final    NO GROWTH 2 DAYS Performed at [facility], [address] [place], [place] [address]    Report Status PENDING  Incomplete     Labs: Basic Metabolic Panel: Recent Labs  Lab 11/27/17 2058 11/28/17 0258 11/29/17 0610 11/30/17 0630 12/01/17 0534  NA 143 144 141 139 140  K 4.2 4.4 3.9 3.9 3.9  CL 107 107 104 106 105  CO2 28 30 27 26 26   GLUCOSE 116* 121* 96 101* 95  BUN 18 17 11 13 21   CREATININE 0.77 0.82 0.79 0.86 0.90  CALCIUM 9.5 9.7 9.0 9.0 8.9   Liver Function Tests: Recent Labs  Lab 11/27/17 2058  AST 21  ALT 27  ALKPHOS  98  BILITOT 0.5  PROT 6.7  ALBUMIN 3.1*   Recent Labs  Lab 11/28/17 0258  LIPASE 29   Recent Labs  Lab 11/27/17 2109  AMMONIA 31   CBC: Recent Labs  Lab 11/27/17 2058 11/28/17 0258 11/29/17 0610 11/30/17 0630 12/01/17 0534  WBC 11.8* 14.0* 10.7* 10.8* 9.3  HGB 12.5 12.7 11.6* 12.1 11.2*  HCT 38.0 38.8 36.1 37.3 34.2*  MCV 90.9 90.9 90.7 90.5 91.7  PLT 201 201 197 192 181   Cardiac Enzymes: No results for input(s): CKTOTAL, CKMB, CKMBINDEX, TROPONINI in the last 168 hours. BNP: BNP (last 3 results) No results for input(s): BNP in the last 8760 hours.  ProBNP (last 3 results) No results for input(s): PROBNP in the last 8760 hours.  CBG: Recent Labs  Lab 11/27/17 2039 11/28/17 2227  GLUCAP 116* 110*       Signed:  Zannie Cove MD.  [facility] 12/01/2017, 11:13 AM

## 2017-12-01 NOTE — Progress Notes (Signed)
Speech Language Pathology Treatment: Dysphagia  Patient Details Name: [name] MRN: [id number] DOB: 02-May-1921 Today's Date: 12/01/2017 Time: 6295-2841 SLP Time Calculation (min) (ACUTE ONLY): 8 min  Assessment / Plan / Recommendation Clinical Impression  Pt tolerating meals, but avoiding difficult to chew textures. Intermittent coughing noted, but not typically associated with PO intake. Pt taking small sips independently, not requesting or needing straws. Recommend pt continue current diet with intermittent supervision for safety. Will sign off.   HPI HPI: [name] is a 84 y.o. female with medical history significant of hypertension, asthma, hypothyroidism, dementia, depression, anxiety, anemia, urinary tract infection, who presents with altered mental status and abdominal tenderness.  Negative chest x-ray, negative CT head of acute intracranial abnormalities. Admitted with acute metabolic encephalopathy. Seen by SLP during prior admissions with findings of functional oropharyngeal swallow, most recently 01/12/15 with soft diet, thin liquids recommended.       SLP Plan  All goals met       Recommendations  Diet recommendations: Regular;Thin liquid Liquids provided via: Cup Medication Administration: Whole meds with puree Supervision: Patient able to self feed Compensations: Slow rate;Small sips/bites;Minimize environmental distractions Postural Changes and/or Swallow Maneuvers: Seated upright 90 degrees                Plan: All goals met       GO                [name], [name] 12/01/2017, 8:53 AM

## 2017-12-01 NOTE — [place] FL2 (Signed)
[place] MEDICAID FL2 LEVEL OF CARE SCREENING TOOL     IDENTIFICATION  Patient Name: [name] Birthdate: [birth year] Sex: female Admission Date (Current Location): 11/27/2017  [facility] and [place] Number:  Producer, television/film/video and Address:  [facility] [place], [address], [place], [place] [address]      Provider Number: [id number]  Attending Physician Name and Address:  Zannie Cove, MD  Relative Name and Phone Number:       Current Level of Care: Hospital Recommended Level of Care: Memory Care Prior Approval Number:    Date Approved/Denied:   PASRR Number:    Discharge Plan: Other (Comment)(Memory Care)    Current Diagnoses: Patient Active Problem List   Diagnosis Date Noted  . Abdominal tenderness 11/28/2017  . Intertriginous candidiasis   . Depression 11/27/2017  . Rash 11/27/2017  . Tibial fracture 09/16/2016  . Left fibular fracture 09/16/2016  . Leukocytosis 09/16/2016  . Pressure ulcer 09/20/2015  . Displaced fracture of right femoral neck (HCC) 09/19/2015  . Altered mental status   . Confusion 06/26/2015  . Fall   . Aspiration pneumonia (HCC) 01/10/2015  . Acute metabolic encephalopathy 01/08/2015  . UTI (urinary tract infection) 01/08/2015  . 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 Recurrent falls 11/27/2014  . FTT (failure to thrive) in adult 10/26/2014  . Dehydration 10/26/2014  . Abnormal urinalysis 10/26/2014  . Normocytic anemia 10/26/2014  . Lump of skin of left upper extremity 09/07/2014  . Rash and nonspecific skin eruption 05/08/2014  . Syncope and collapse 11/30/2013  . Low back pain, episodic 11/20/2013  . Chronic kidney disease   . Insomnia 08/31/2013  . Arthritis 10/02/2012  . Valvular heart disease 09/04/2012  . Restless leg syndrome 06/06/2012  . Trochanteric bursitis 08/02/2011  . Dementia 08/02/2011  . Leg pain 03/28/2011  . Hypertension 09/22/2010  . Hypothyroidism 09/22/2010     Orientation RESPIRATION BLADDER Height & Weight        Normal Incontinent Weight: 192 lb 0.3 oz (87.1 kg) Height:     BEHAVIORAL SYMPTOMS/MOOD NEUROLOGICAL BOWEL NUTRITION STATUS      Continent Diet(regular)  AMBULATORY STATUS COMMUNICATION OF NEEDS Skin   Extensive Assist Verbally Normal                       Personal Care Assistance Level of Assistance  Bathing, Feeding, Dressing Bathing Assistance: Maximum assistance Feeding assistance: Limited assistance Dressing Assistance: Maximum assistance     Functional Limitations Info  Sight, Hearing, Speech Sight Info: Adequate Hearing Info: Adequate Speech Info: Adequate    SPECIAL CARE FACTORS FREQUENCY  PT (By licensed PT), OT (By licensed OT)     PT Frequency: 3x/wk with home health OT Frequency: 3x/wk with home health            Contractures Contractures Info: Not present    Additional Factors Info  Code Status, Allergies, Psychotropic Code Status Info: DNR Allergies Info: Morphine And Related, Codeine, Hydrocodone Psychotropic Info: Klonopin 0.25 mg daily at bed; Lexapro 5mg  daily         Current Medications (12/01/2017):  This is the current hospital active medication list Current Facility-Administered Medications  Medication Dose Route Frequency Provider Last Rate Last Dose  . acetaminophen (TYLENOL) suppository 650 mg  650 mg Rectal Q6H PRN Lorretta Harp, MD   650 mg at 11/28/17 0310  . cefTRIAXone (ROCEPHIN) 1 g in sodium chloride 0.9 % 100 mL IVPB  1 g Intravenous Q24H Zannie CoveJoseph, Preetha, MD 200 mL/hr at 12/01/17 1035 1 g at 12/01/17 1035  . clonazePAM (KLONOPIN) tablet 0.25 mg  0.25 mg Oral QHS Zannie CoveJoseph, Preetha, MD   0.25 mg at 11/30/17 2103  . enoxaparin (LOVENOX) injection 40 mg  40 mg Subcutaneous Q24H Lorretta HarpNiu, Xilin, MD   40 mg at 12/01/17 0631  . escitalopram (LEXAPRO) tablet 5 mg  5 mg Oral Daily Zannie CoveJoseph, Preetha, MD   5 mg at 12/01/17 1010  . hydrALAZINE (APRESOLINE) injection 5 mg  5 mg Intravenous  Q2H PRN Lorretta HarpNiu, Xilin, MD      . levothyroxine (SYNTHROID, LEVOTHROID) tablet 75 mcg  75 mcg Oral QAC breakfast Zannie CoveJoseph, Preetha, MD   75 mcg at 12/01/17 0815  . nystatin cream (MYCOSTATIN)   Topical BID Lorretta HarpNiu, Xilin, MD      . ondansetron [facility](ZOFRAN) injection 4 mg  4 mg Intravenous Q8H PRN Lorretta HarpNiu, Xilin, MD      . rivastigmine (EXELON) capsule 1.5 mg  1.5 mg Oral BID Zannie CoveJoseph, Preetha, MD   1.5 mg at 12/01/17 1011     Discharge Medications: Medication List    STOP taking these medications   loperamide 2 MG capsule Commonly known as:  IMODIUM   magnesium hydroxide 400 MG/5ML suspension Commonly known as:  MILK OF MAGNESIA   Melatonin 3 MG Tabs   MINTOX 200-200-20 MG/5ML suspension Generic drug:  alum & mag hydroxide-simeth   traMADol 50 MG tablet Commonly known as:  ULTRAM     TAKE these medications   acetaminophen 500 MG tablet Commonly known as:  TYLENOL Take 500 mg by mouth every 4 (four) hours as needed for mild pain, moderate pain, fever or headache.   amoxicillin 500 MG tablet Commonly known as:  AMOXIL Take 1 tablet (500 mg total) by mouth 2 (two) times daily. For 2days   aspirin 81 MG chewable tablet Chew 81 mg daily by mouth.   clonazePAM 0.5 MG tablet Commonly known as:  KLONOPIN Take 0.5 tablets (0.25 mg total) by mouth 2 (two) times daily.   divalproex 125 MG capsule Commonly known as:  DEPAKOTE SPRINKLE Take 1 capsule (125 mg total) by mouth 2 (two) times daily. What changed:  when to take this   escitalopram 5 MG tablet Commonly known as:  LEXAPRO Take 5 mg daily by mouth.   guaifenesin 100 MG/5ML syrup Commonly known as:  ROBITUSSIN Take 200 mg by mouth every 6 (six) hours as needed for cough.   levothyroxine 75 MCG tablet Commonly known as:  SYNTHROID, LEVOTHROID Take 1 tablet (75 mcg total) by mouth daily before breakfast. What changed:    medication strength  how much to take   multivitamin with minerals tablet Take 1 tablet daily  with breakfast by mouth. "Akorn/s antioxidant"   NUTRITIONAL DRINK Liqd Take 1 Bottle by mouth 3 (three) times daily with meals. *Mighty Shakes*   risperiDONE 0.5 MG tablet Commonly known as:  RISPERDAL Take 0.5 mg daily as needed by mouth (severe agitation/anxiety).   rivastigmine 1.5 MG capsule Commonly known as:  EXELON Take 1.5 mg by mouth 2 (two) times daily.   senna 8.6 MG Tabs tablet Commonly known as:  SENOKOT Take 17.2 mg by mouth 2 (two) times daily.   traZODone 50 MG tablet Commonly known as:  DESYREL Take 1 tablet (50 mg total) by mouth at bedtime as needed for sleep. What changed:    when to take this  reasons to take this   TRIPLE  ANTIBIOTIC [phone] Oint Apply 1 application 3 (three) times daily as needed topically (skin tears/abrasians).     Relevant Imaging Results:  Relevant Lab Results:   Additional Information SS#: [id number]  Baldemar Lenis, LCSW

## 2017-12-01 NOTE — Care Management Note (Signed)
Case Management Note  Patient Details  Name: [name] MRN: [id number] Date of Birth: [birth year]  Subjective/Objective:     Pt admitted with acute metabolic encephalopathy. [facility]ann[facility]uss [facility].                Action/Plan: [facility]. Per CSW [facility]  No further needs per CM.  Expected Discharge Date:  12/01/17               Expected Discharge Plan:  Assisted Living / Rest Home  In-House Referral:  Clinical Social Work  Discharge planning Services  CM Consult  Post Acute Care Choice:    Choice offered to:     DME Arranged:    DME Agency:     HH Arranged:    HH Agency:     Status of Service:  Completed, signed off  If discussed at [employer] Meetings, dates discussed:    Additional Comments:  Kermit BaloKelli F Shan Valdes, RN 12/01/2017, 12:49 PM

## 2017-12-01 NOTE — Clinical Social Work Note (Signed)
Clinical Social Work Assessment  Patient Details  Name: [name] MRN: [id number] Date of Birth: 05-24-1921  Date of referral:  12/01/17               Reason for consult:  Facility Placement                Permission sought to share information with:  Facility Medical sales representativeContact Representative, Family Supports Permission granted to share information::  Yes, Verbal Permission Granted  Name::     Public affairs consultantCharles  Agency::  [facility]  Relationship::  Son  SolicitorContact Information:     Housing/Transportation Living arrangements for the past 2 months:  Assisted Living Facility Source of Information:  Adult Children Patient Interpreter Needed:  None Criminal Activity/Legal Involvement Pertinent to Current Situation/Hospitalization:  No - Comment as needed Significant Relationships:  Adult Children Lives with:  Self, Facility Resident Do you feel safe going back to the place where you live?  Yes Need for family participation in patient care:  Yes (Comment)  Care giving concerns:  [facility] and family has no concerns about care received.   Social Worker assessment / plan:  CSW spoke with patient's son about patient returning back to her Memory Care today. CSW sent information to Memory Care unit with home health orders to set up care for the patient upon return. CSW to follow to set up transport when Memory Care is ready for her.  Employment status:  Retired Database administratornsurance information:  Managed Medicare PT Recommendations:  Skilled Nursing Facility Information / Referral to community resources:     Patient/Family's Response to care:  Patient's family agreeable to return to [facility].  Patient/Family's Understanding of and Emotional Response to Diagnosis, Current Treatment, and Prognosis:  Patient's son said that he was happy to hear that the patient would be returning home. Patient's son reported that [facility] and they take care of  everything that the patient needs.  Emotional Assessment Appearance:  Appears stated age Attitude/Demeanor/Rapport:  Unable to Assess Affect (typically observed):  Unable to Assess Orientation:    Alcohol / Substance use:  Not Applicable Psych involvement (Current and /or in the community):  No (Comment)  Discharge Needs  Concerns to be addressed:  Care Coordination Readmission within the last 30 days:  No Current discharge risk:  Physical Impairment, Cognitively Impaired Barriers to Discharge:  No Barriers Identified   Deborah Lenislizabeth M Juandedios Dudash, LCSW 12/01/2017, 12:54 PM

## 2017-12-01 NOTE — Progress Notes (Signed)
[facility] Anticipated discharge date: 12/01/17 Family notified: Yes, by phone Transportation by: [facility]  Report #: [id number]  CSW signing off.  [name] Nicelylizabeth Braheem Tomasik LCSW [phone]

## 2017-12-01 NOTE — Progress Notes (Addendum)
Pt is d/c back to SNF, pt is stable with no s/s of distress. Report will be called to receiving nurse at facility.  Report called hand over report to Hartford FinancialDana RN [facility].

## 2017-12-01 NOTE — Care Management Important Message (Signed)
Important Message  Patient Details  Name: [name] MRN: [id number] Date of Birth: 10-14-1920   Medicare Important Message Given:  No Due to patient illness  Patient did not sign.  Unsigned copy left.   Birdell Frasier 12/01/2017, 3:01 PM

## 2017-12-03 LAB — CULTURE, BLOOD (ROUTINE X 2)
CULTURE: NO GROWTH
Culture: NO GROWTH
Special Requests: ADEQUATE
Special Requests: ADEQUATE

## 2017-12-04 IMAGING — DX DG CHEST 1V
1 series · 1 of 1 positions shown · non-contrast
Comparison: [DATE]/ [DATE].  01/09/2016.

CLINICAL DATA: Leukocytosis.  Hypoxia.  Fall.

EXAM:
CHEST 1 VIEW

[chest ap]
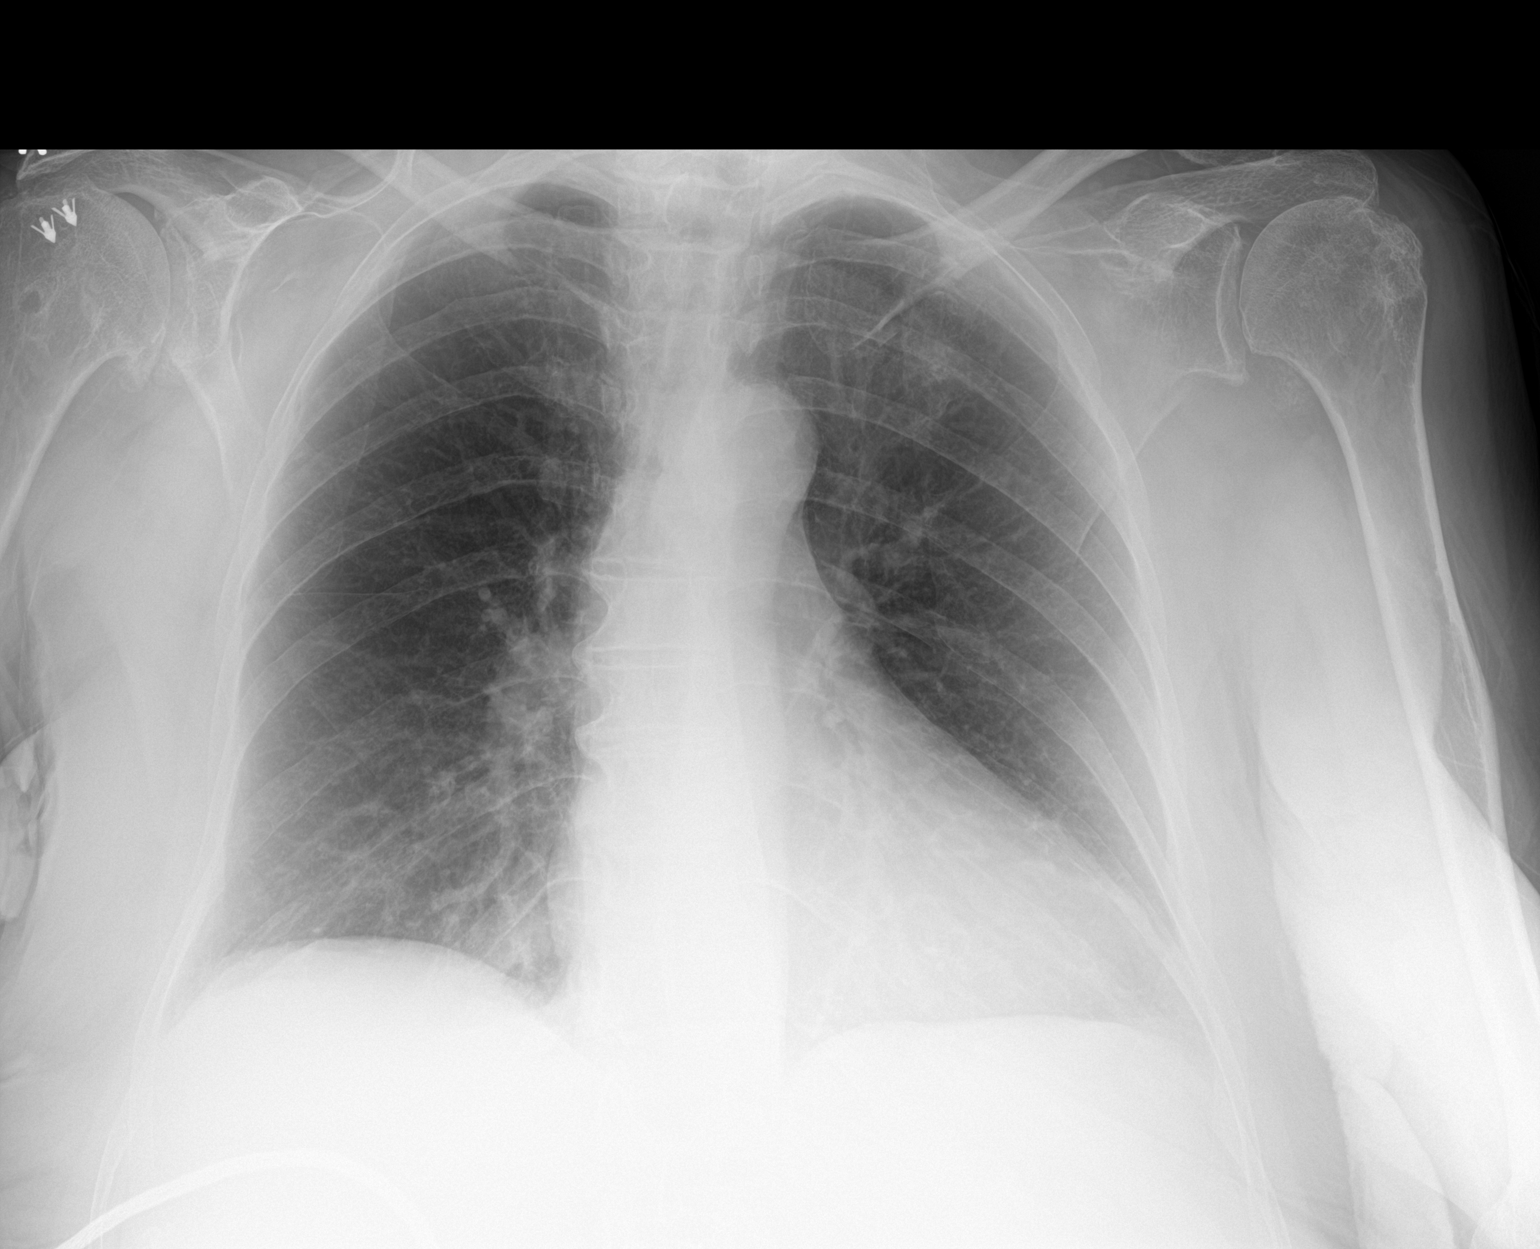

[1 of 1 positions shown; findings below may reference images not displayed]

FINDINGS: Mediastinum and hilar structures are normal. Stable mild
cardiomegaly. No pulmonary venous congestion. Mild right infiltrate
noted. No pleural effusion or pneumothorax. Thoracic spine scoliosis
and degenerative change. Stable postsurgical changes proximal right
humerus.
IMPRESSION: Mild right base infiltrate suggesting pneumonia . Follow-up exam to
demonstrate clearing suggested.

## 2017-12-04 IMAGING — CR DG KNEE 1-2V*L*
2 series · 2 of 2 positions shown · non-contrast
Comparison: None.

CLINICAL DATA: Unwitnessed fall pain below the patella

EXAM:
LEFT KNEE - 1-2 VIEW

[x knee ap left]
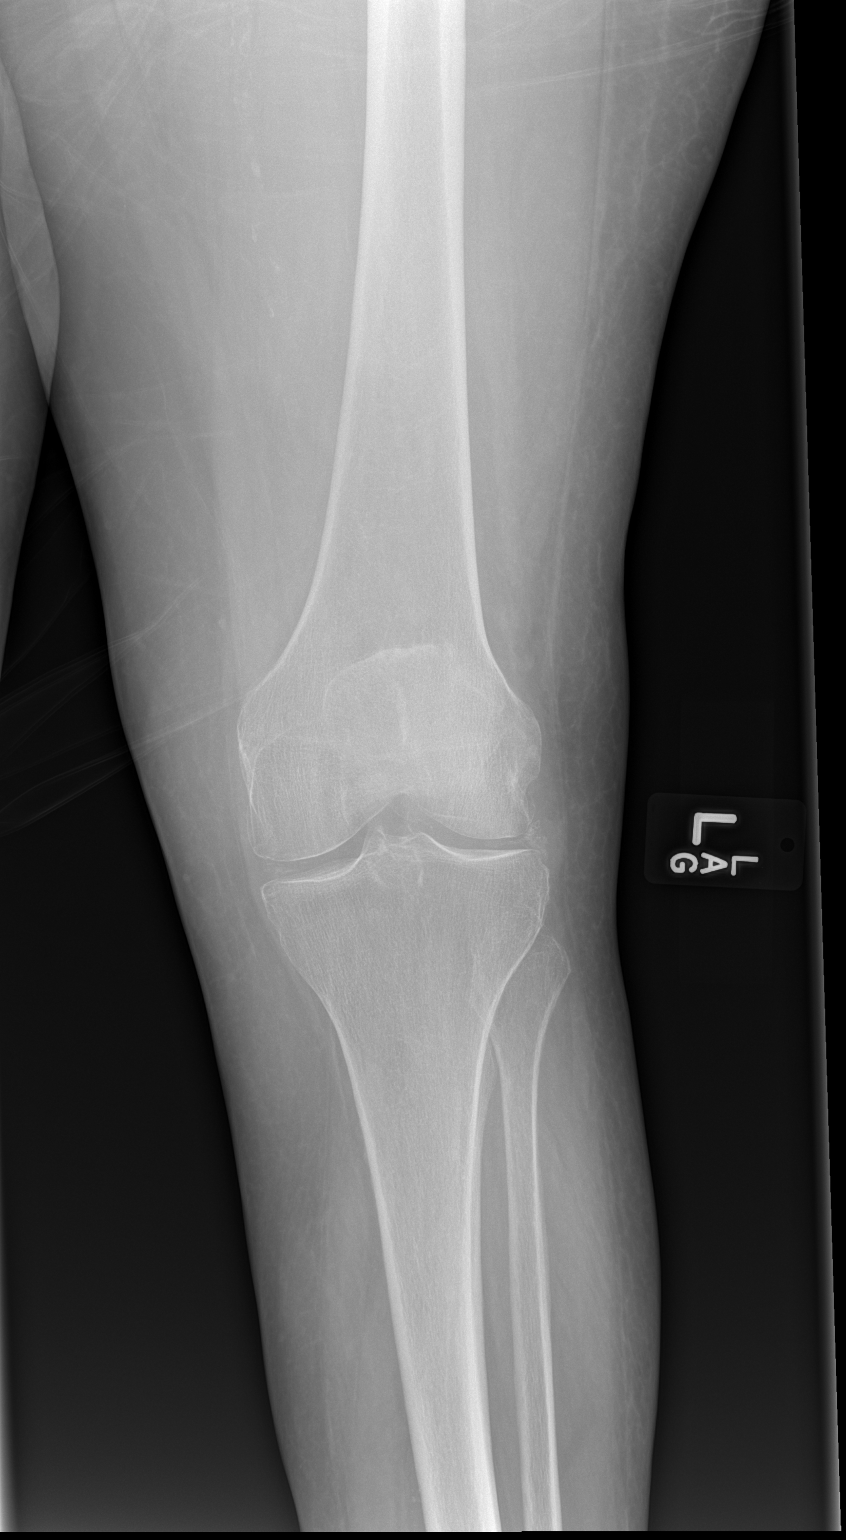

[x knee lat left]
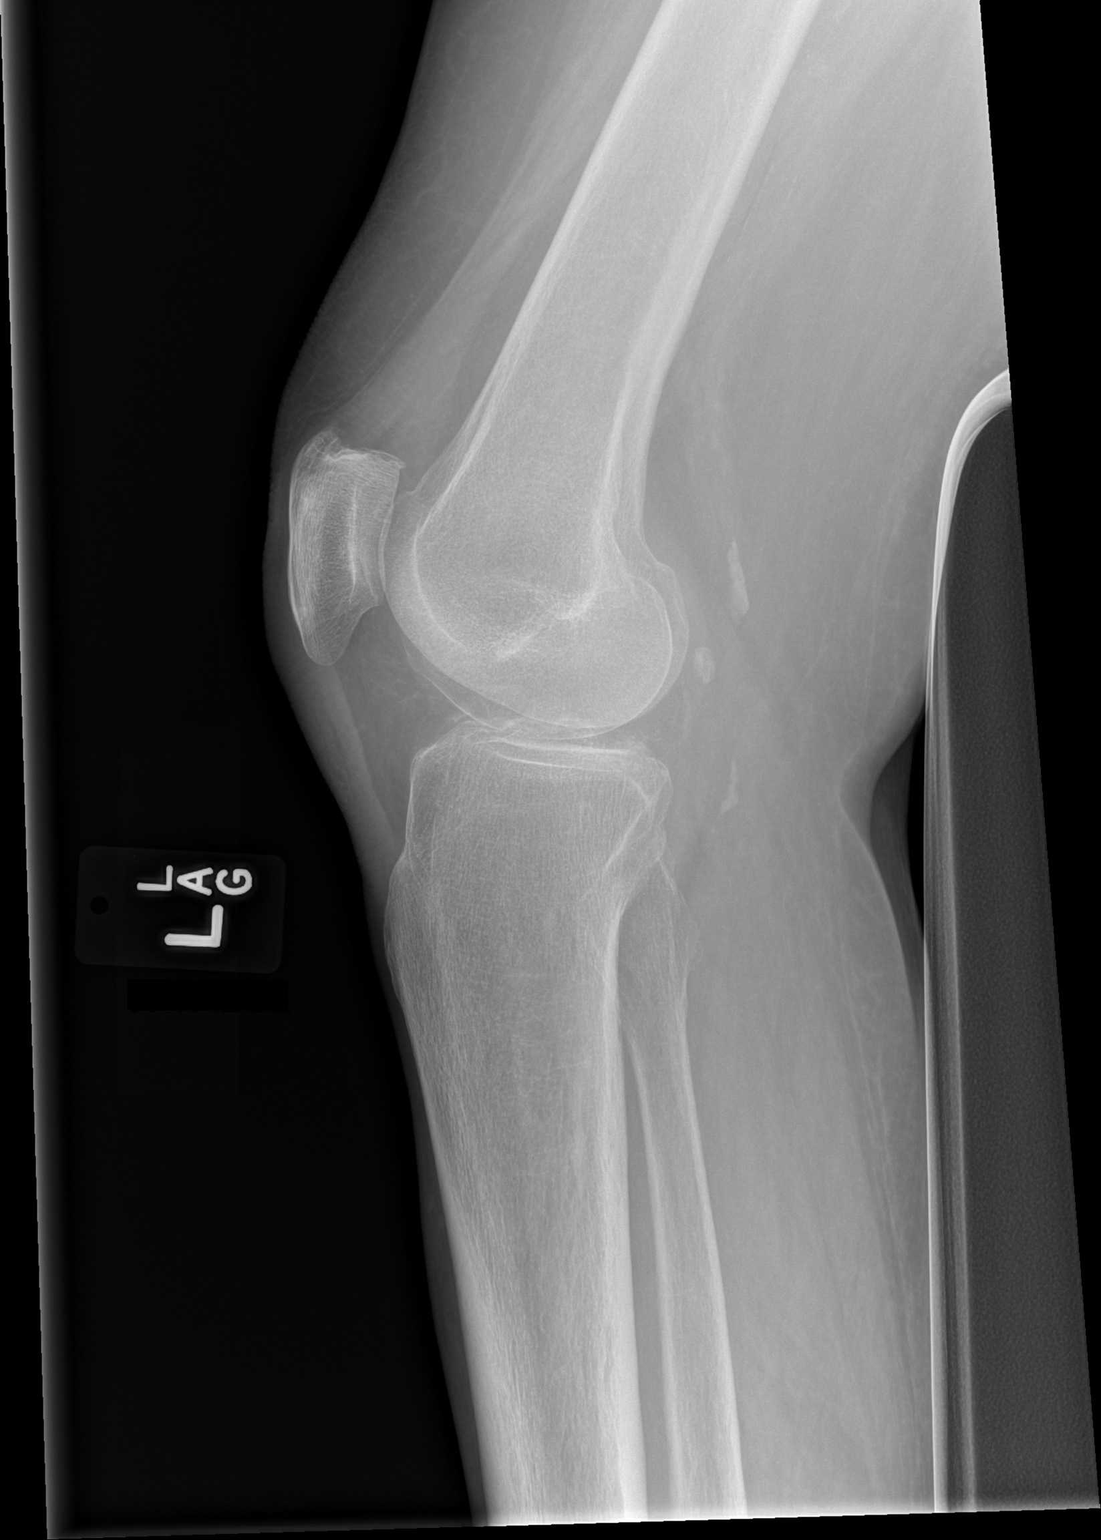

[2 of 2 positions shown; findings below may reference images not displayed]

FINDINGS: Vascular calcifications. Joint space calcifications medially and
laterally. No fracture or malalignment. Trace suprapatellar
effusion. Mild narrowing of the medial compartment and
patellofemoral compartments with bony spurring.
IMPRESSION: 1. No acute osseous abnormality
2. Chondrocalcinosis
3. Trace suprapatellar effusion

## 2017-12-04 IMAGING — CR DG TIBIA/FIBULA 2V*L*
2 series · 2 of 2 positions shown · non-contrast
Comparison: None.

CLINICAL DATA: Fall with pain

EXAM:
LEFT TIBIA AND FIBULA - 2 VIEW

[x tib-fib ap left (1 of 2)]
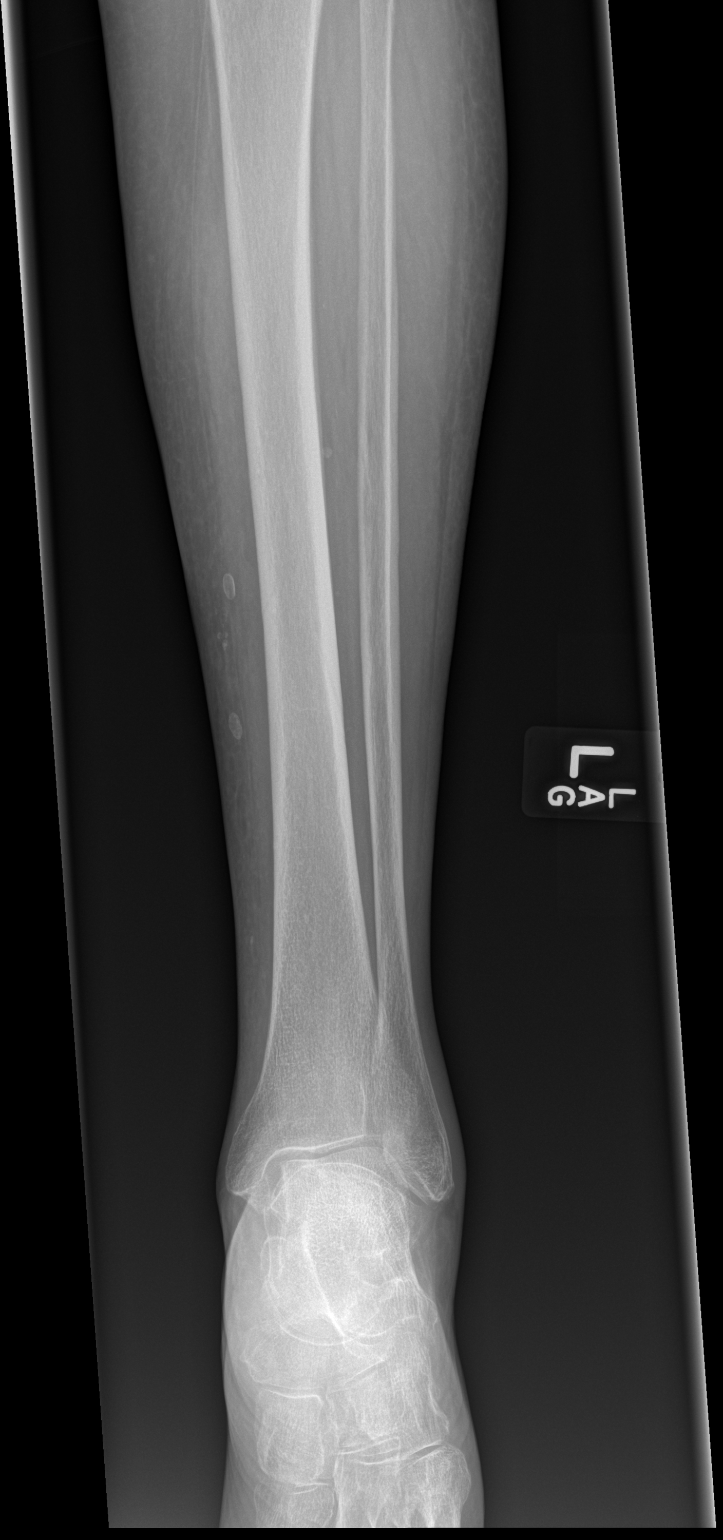

[x tib-fib ap left (2 of 2)]
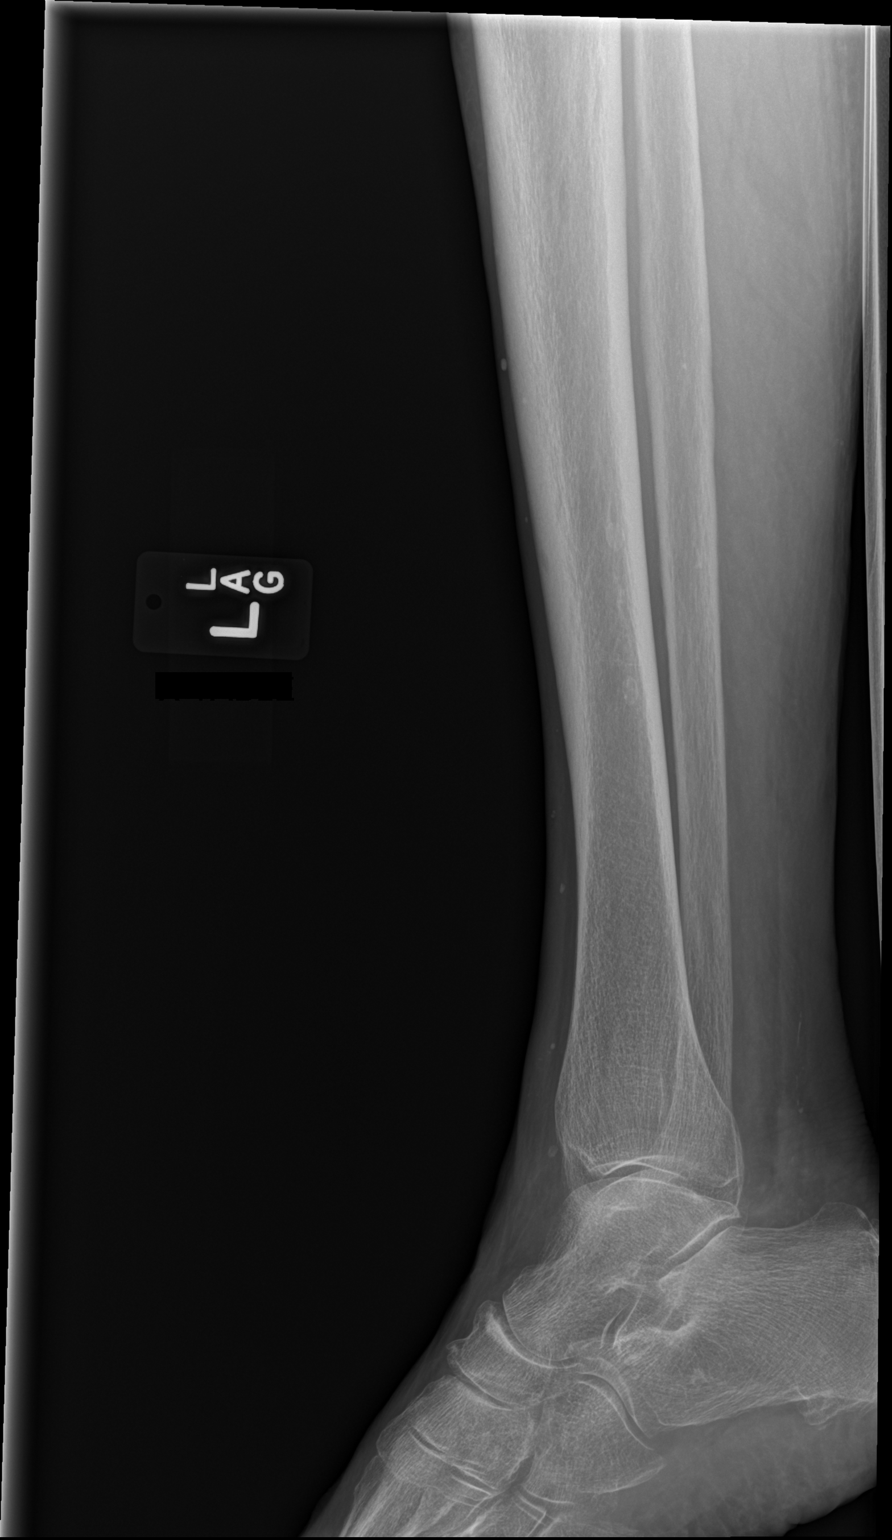

[2 of 2 positions shown; findings below may reference images not displayed]

FINDINGS: Soft tissue calcifications. Mild diffuse subcutaneous edema.
Possible anterior cortical fracture of the distal tibia. Small
plantar calcaneal spur. Possible tiny avulsion fibular malleolar tip
IMPRESSION: 1. Possible cortical avulsion fracture distal anterior tibia.
2. Possible tiny chip fracture off the lateral fibular malleolus.

## 2017-12-07 DIAGNOSIS — Z87440 Personal history of urinary (tract) infections: Secondary | ICD-10-CM | POA: Diagnosis not present

## 2017-12-07 DIAGNOSIS — F0281 Dementia in other diseases classified elsewhere with behavioral disturbance: Secondary | ICD-10-CM | POA: Diagnosis not present

## 2017-12-07 DIAGNOSIS — Z7689 Persons encountering health services in other specified circumstances: Secondary | ICD-10-CM | POA: Diagnosis not present

## 2017-12-07 DIAGNOSIS — G309 Alzheimer's disease, unspecified: Secondary | ICD-10-CM | POA: Diagnosis not present

## 2017-12-07 DIAGNOSIS — G0430 Acute necrotizing hemorrhagic encephalopathy, unspecified: Secondary | ICD-10-CM | POA: Diagnosis not present

## 2017-12-07 DIAGNOSIS — E034 Atrophy of thyroid (acquired): Secondary | ICD-10-CM | POA: Diagnosis not present

## 2017-12-08 DIAGNOSIS — G0430 Acute necrotizing hemorrhagic encephalopathy, unspecified: Secondary | ICD-10-CM | POA: Diagnosis not present

## 2017-12-09 DIAGNOSIS — G0430 Acute necrotizing hemorrhagic encephalopathy, unspecified: Secondary | ICD-10-CM | POA: Diagnosis not present

## 2017-12-10 DIAGNOSIS — G0430 Acute necrotizing hemorrhagic encephalopathy, unspecified: Secondary | ICD-10-CM | POA: Diagnosis not present

## 2017-12-11 DIAGNOSIS — G0430 Acute necrotizing hemorrhagic encephalopathy, unspecified: Secondary | ICD-10-CM | POA: Diagnosis not present

## 2017-12-12 DIAGNOSIS — G0430 Acute necrotizing hemorrhagic encephalopathy, unspecified: Secondary | ICD-10-CM | POA: Diagnosis not present

## 2017-12-12 IMAGING — CT CT HEAD W/O CM
3 of 8 series · 14 of 47 positions shown, 17 images · non-contrast
Comparison: 09/12/2016

CLINICAL DATA: Patient fell

EXAM:
CT HEAD WITHOUT CONTRAST
CT CERVICAL SPINE WITHOUT CONTRAST
TECHNIQUE: Multidetector CT imaging of the head and cervical spine was
performed following the standard protocol without intravenous
contrast. Multiplanar CT image reconstructions of the cervical spine
were also generated.

[Series 5: coronal · coronal · 0.29mm/px · 3 of 59 slices shown]
[im 22/59  brain]
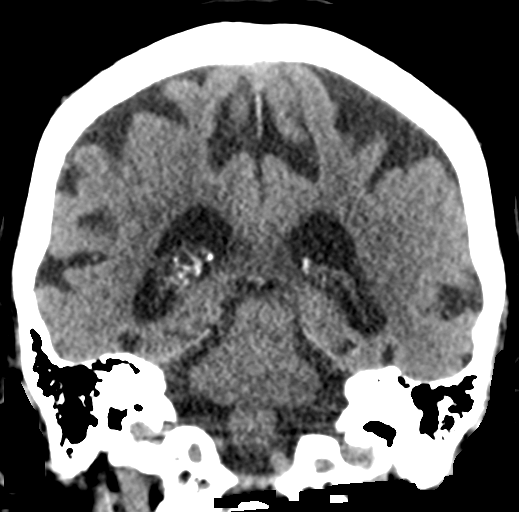
[im 30/59  brain]
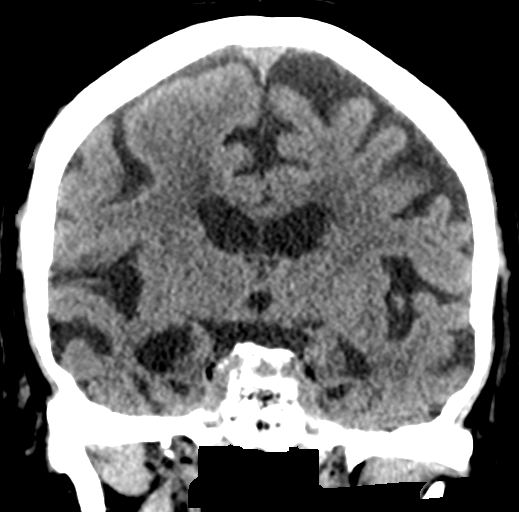
[im 37/59  brain]
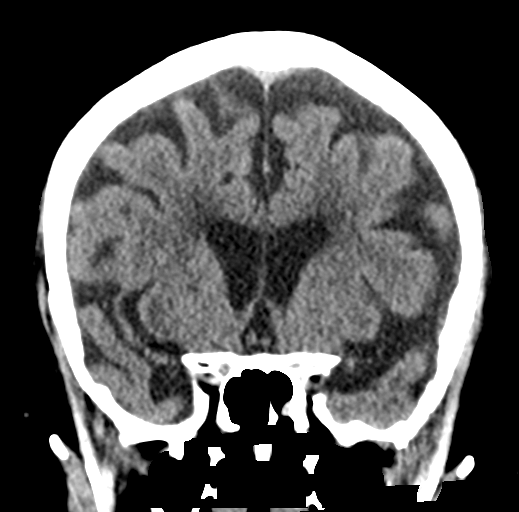

[Series 6: sagittal · sagittal · 0.29mm/px · 2 of 50 slices shown]
[im 17/50  brain]
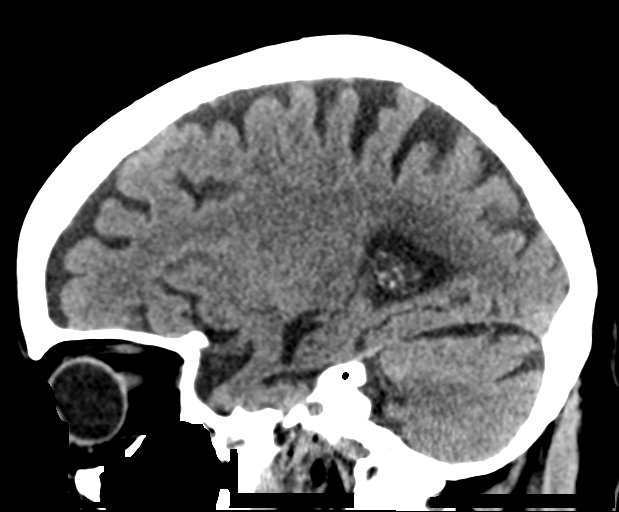
[im 33/50  brain]
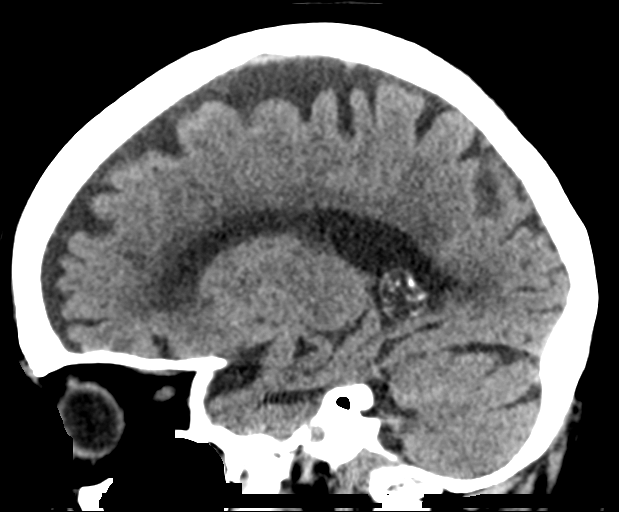

[Series 9: axial recon · axial · 0.20mm/px · z∈[+1055,+1187]mm · 9 of 95 slices shown, 12 images]
[im 10/95  brain]
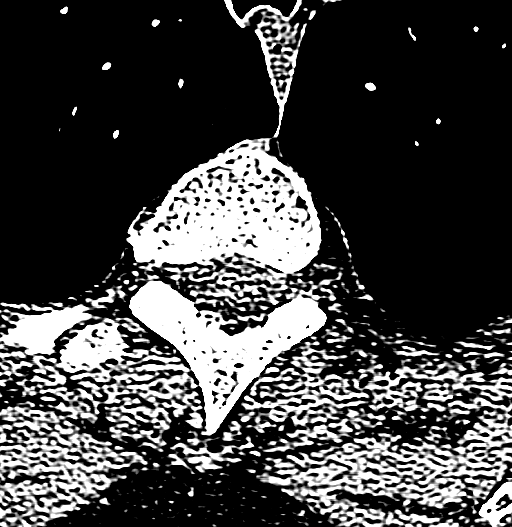
[im 10/95  bone]
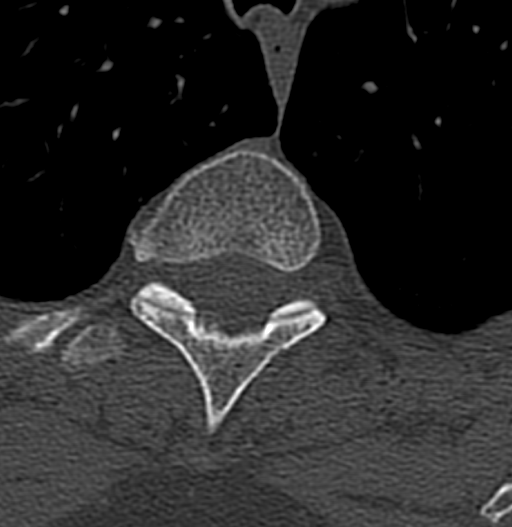
[im 19/95  brain]
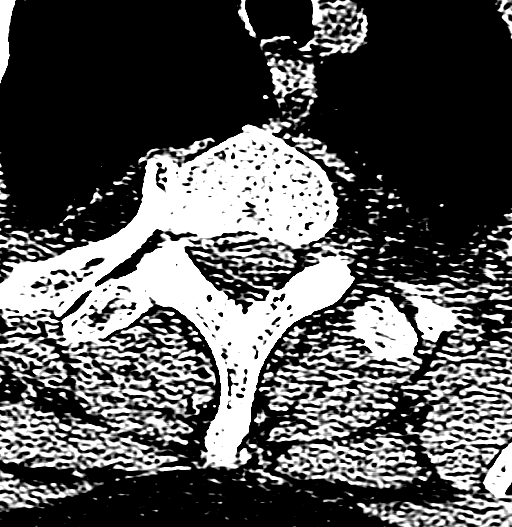
[im 29/95  brain]
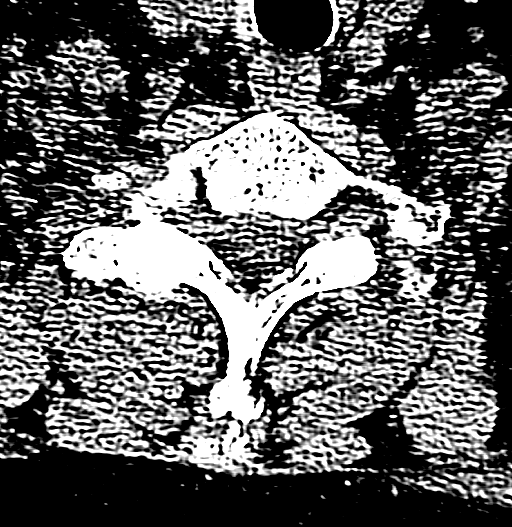
[im 38/95  brain]
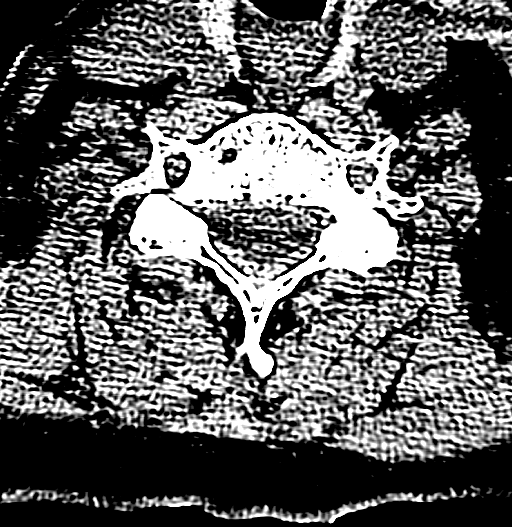
[im 48/95  brain]
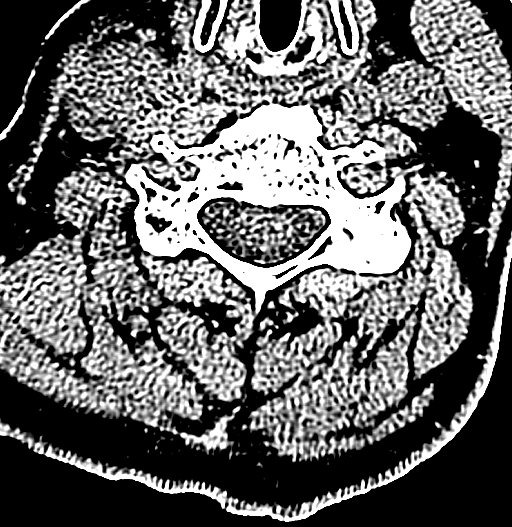
[im 48/95  bone]
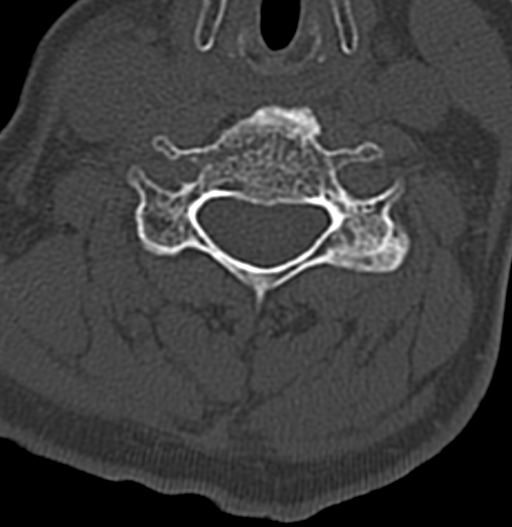
[im 57/95  brain]
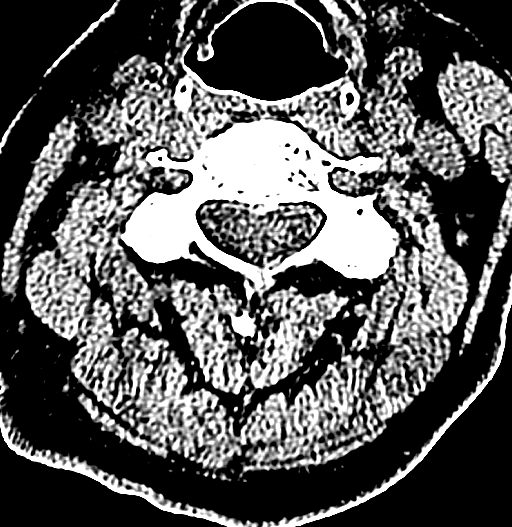
[im 66/95  brain]
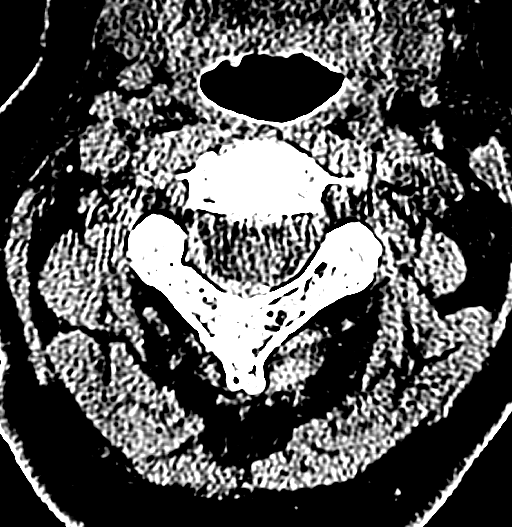
[im 76/95  brain]
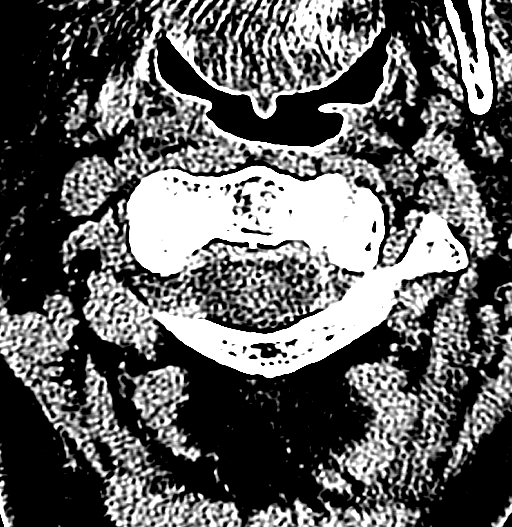
[im 85/95  brain]
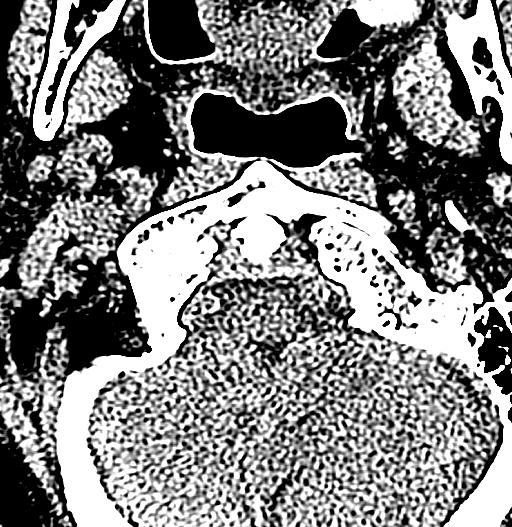
[im 85/95  bone]
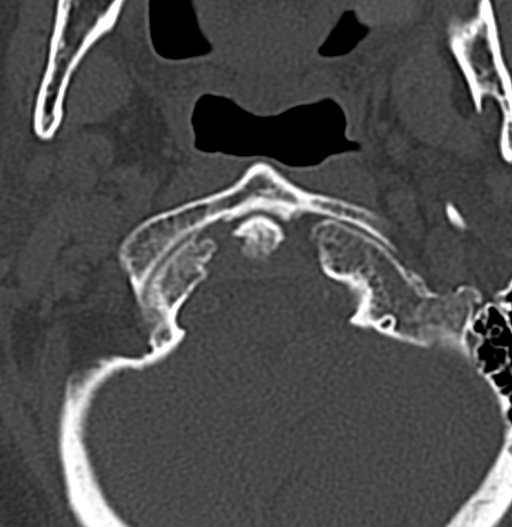

[14 of 47 positions shown; findings below may reference images not displayed]

FINDINGS: CT HEAD FINDINGS

Brain: No mass lesion, intraparenchymal hemorrhage or extra-axial
collection. No evidence of acute cortical infarct. Chronic
age-related involutional changes of the brain.

Vascular: No hyperdense vessel or unexpected calcification.

Skull: No acute skull fracture. Mild soft tissue induration of the
left parietal scalp.

Sinuses/Orbits: No sinus fluid levels or advanced mucosal
thickening. No mastoid effusion. Intact orbits and globes.

CT CERVICAL SPINE FINDINGS

Alignment: No static subluxation. Facets are aligned. Occipital
condyles are normally positioned.

Skull base and vertebrae: No acute fracture.

Soft tissues and spinal canal: No prevertebral fluid or swelling. No
visible canal hematoma.

Disc levels: No advanced spinal canal or neural foraminal stenosis.

Upper chest: No pneumothorax, pulmonary nodule or pleural effusion.

Other: Normal visualized paraspinal cervical soft tissues.
IMPRESSION: 1. Mild left parietal scalp soft tissue induration without
underlying skull fracture or intracranial abnormality.
2. Findings of chronic microvascular ischemia.
3. No acute fracture or static subluxation of the cervical spine.

## 2017-12-13 DIAGNOSIS — G0430 Acute necrotizing hemorrhagic encephalopathy, unspecified: Secondary | ICD-10-CM | POA: Diagnosis not present

## 2017-12-14 DIAGNOSIS — G0430 Acute necrotizing hemorrhagic encephalopathy, unspecified: Secondary | ICD-10-CM | POA: Diagnosis not present

## 2017-12-15 DIAGNOSIS — G0430 Acute necrotizing hemorrhagic encephalopathy, unspecified: Secondary | ICD-10-CM | POA: Diagnosis not present

## 2017-12-16 DIAGNOSIS — G0430 Acute necrotizing hemorrhagic encephalopathy, unspecified: Secondary | ICD-10-CM | POA: Diagnosis not present

## 2017-12-17 DIAGNOSIS — G0430 Acute necrotizing hemorrhagic encephalopathy, unspecified: Secondary | ICD-10-CM | POA: Diagnosis not present

## 2017-12-18 DIAGNOSIS — G0430 Acute necrotizing hemorrhagic encephalopathy, unspecified: Secondary | ICD-10-CM | POA: Diagnosis not present

## 2017-12-19 DIAGNOSIS — G0430 Acute necrotizing hemorrhagic encephalopathy, unspecified: Secondary | ICD-10-CM | POA: Diagnosis not present

## 2017-12-20 DIAGNOSIS — G0430 Acute necrotizing hemorrhagic encephalopathy, unspecified: Secondary | ICD-10-CM | POA: Diagnosis not present

## 2017-12-21 DIAGNOSIS — G309 Alzheimer's disease, unspecified: Secondary | ICD-10-CM | POA: Diagnosis not present

## 2017-12-21 DIAGNOSIS — G4700 Insomnia, unspecified: Secondary | ICD-10-CM | POA: Diagnosis not present

## 2017-12-21 DIAGNOSIS — F0281 Dementia in other diseases classified elsewhere with behavioral disturbance: Secondary | ICD-10-CM | POA: Diagnosis not present

## 2017-12-21 DIAGNOSIS — G0430 Acute necrotizing hemorrhagic encephalopathy, unspecified: Secondary | ICD-10-CM | POA: Diagnosis not present

## 2017-12-22 DIAGNOSIS — F0281 Dementia in other diseases classified elsewhere with behavioral disturbance: Secondary | ICD-10-CM | POA: Diagnosis not present

## 2017-12-22 DIAGNOSIS — G0430 Acute necrotizing hemorrhagic encephalopathy, unspecified: Secondary | ICD-10-CM | POA: Diagnosis not present

## 2017-12-22 DIAGNOSIS — F411 Generalized anxiety disorder: Secondary | ICD-10-CM | POA: Diagnosis not present

## 2017-12-22 IMAGING — CT CT CERVICAL SPINE W/O CM
4 of 7 series · 13 of 33 positions shown, 15 images · non-contrast
Comparison: 09/24/2016 CT head and cervical spine.

CLINICAL DATA: Unresponsive and nonverbal. History of multiple
falls.

EXAM:
CT HEAD WITHOUT CONTRAST
CT CERVICAL SPINE WITHOUT CONTRAST
TECHNIQUE: Multidetector CT imaging of the head and cervical spine was
performed following the standard protocol without intravenous
contrast. Multiplanar CT image reconstructions of the cervical spine
were also generated.

[Series 5: head bone · axial · 0.41mm/px · z∈[-58,+22]mm · 3 of 80 slices shown]
[im 20/80  bone]
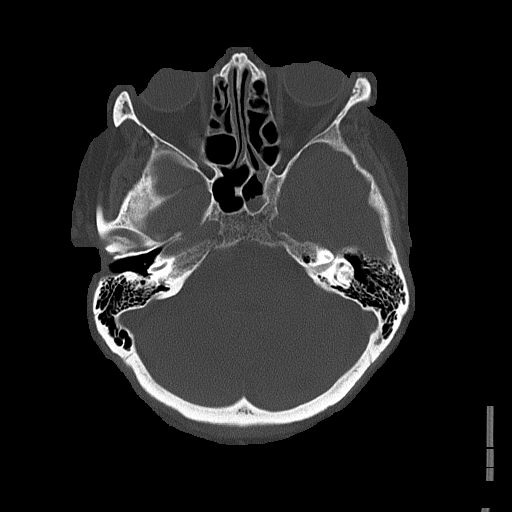
[im 40/80  bone]
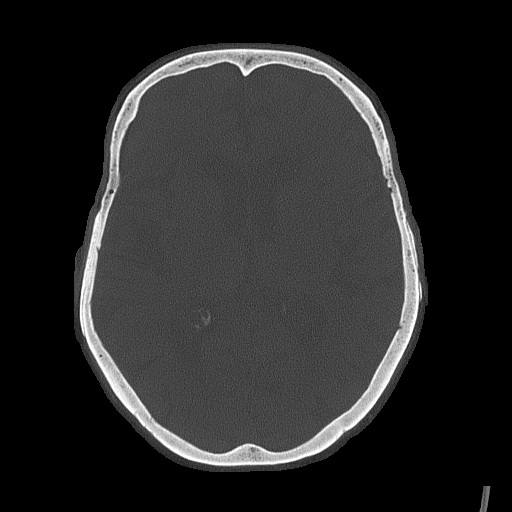
[im 60/80  bone]
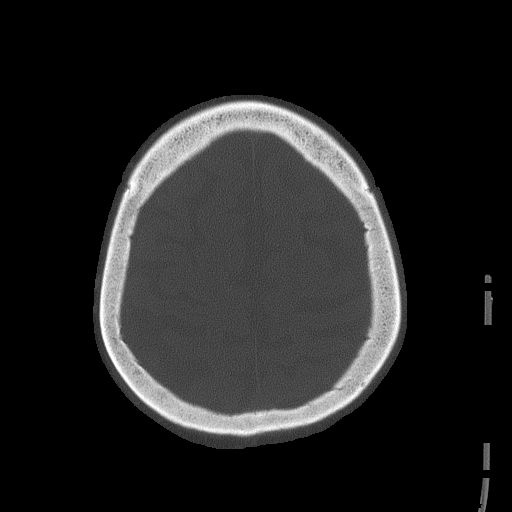

[Series 8: c_spine 2.0 st · axial · 0.34mm/px · z∈[-232,-96]mm · 5 of 102 slices shown, 7 images]
[im 17/102  soft-tissue]
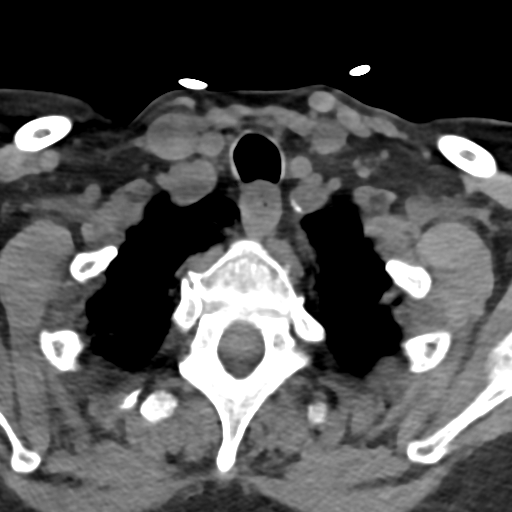
[im 17/102  bone]
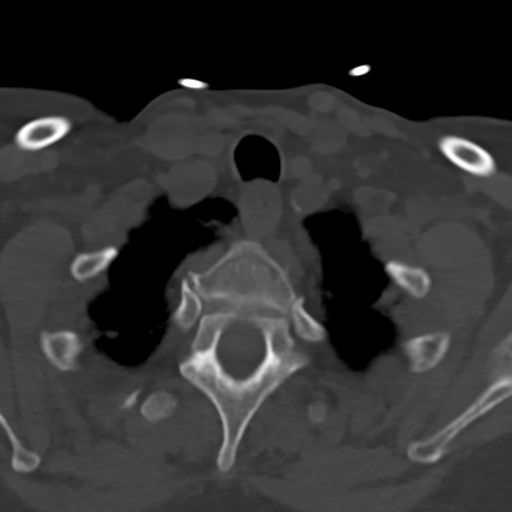
[im 34/102  bone]
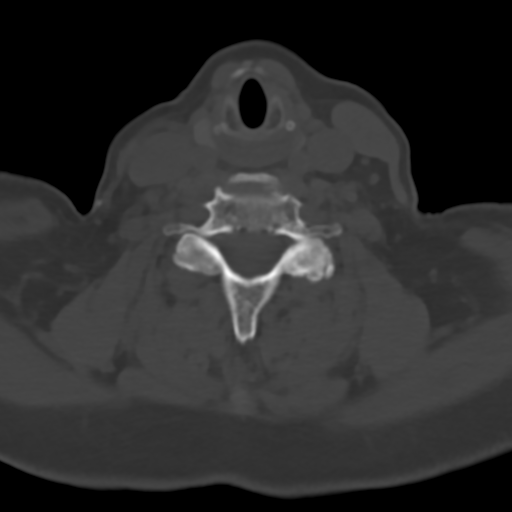
[im 51/102  bone]
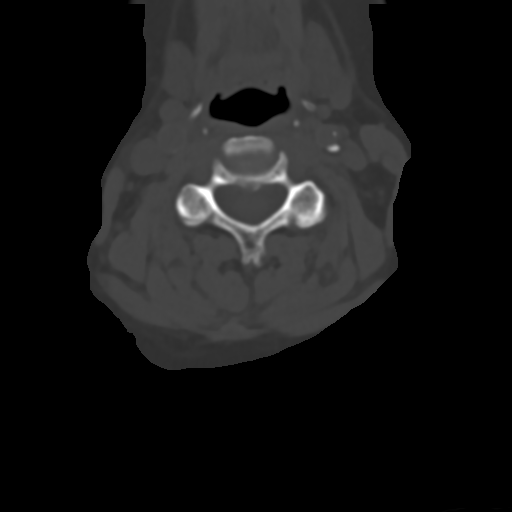
[im 68/102  bone]
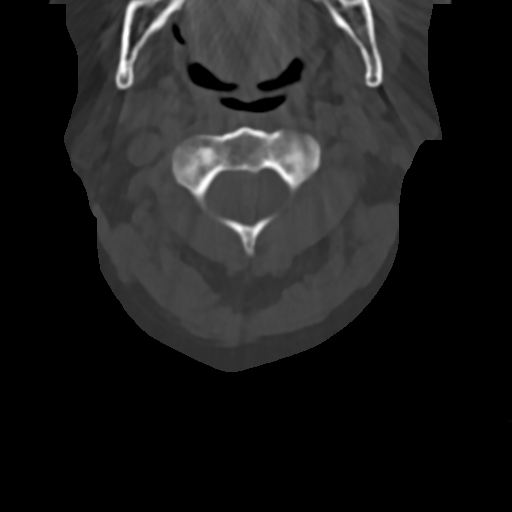
[im 85/102  soft-tissue]
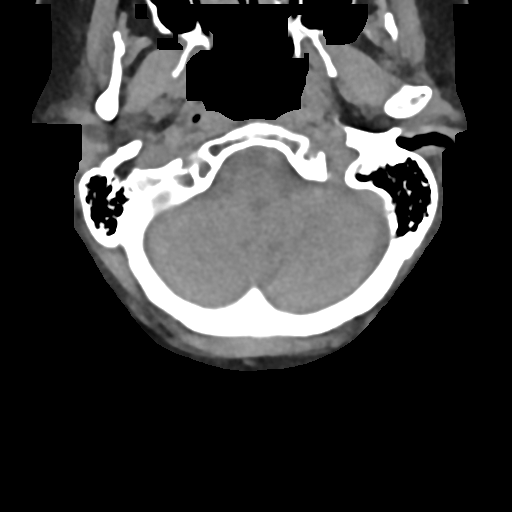
[im 85/102  bone]
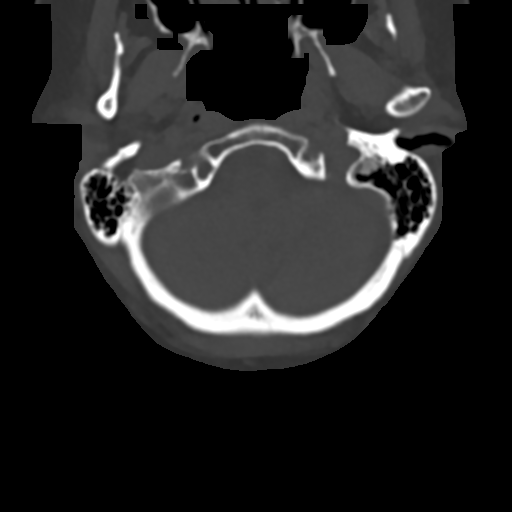

[Series 10: c_spine 2.0 sag bone · sagittal · 0.27mm/px · 4 of 45 slices shown]
[im 9/45  bone]
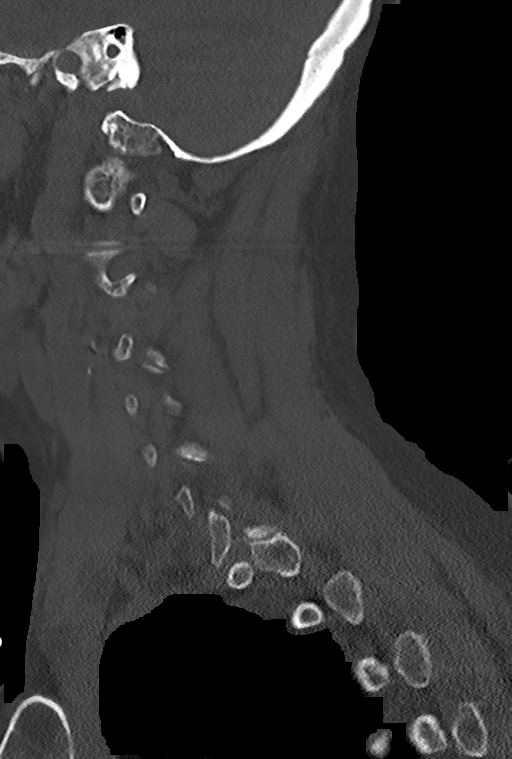
[im 18/45  bone]
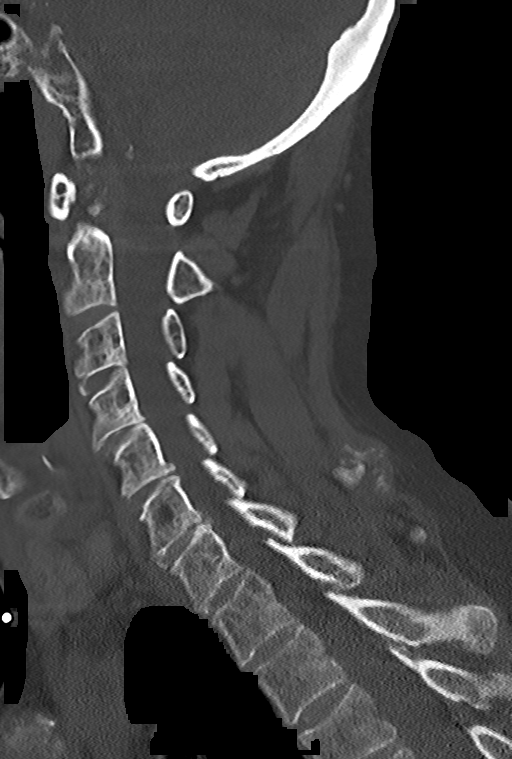
[im 27/45  bone]
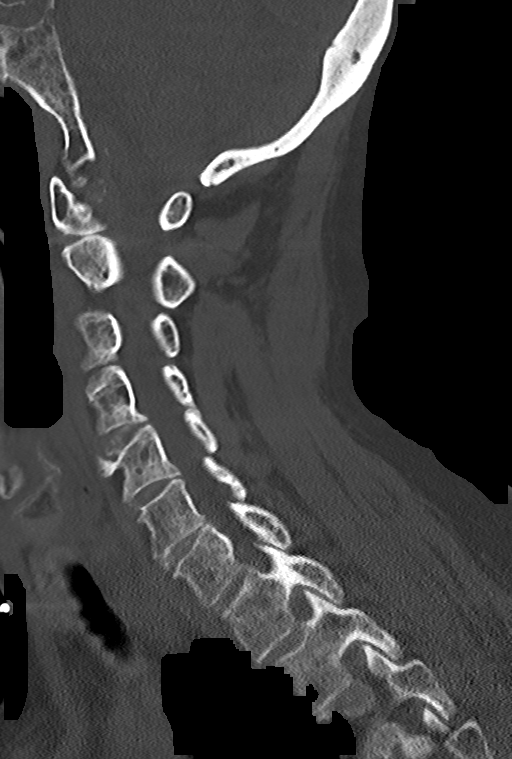
[im 36/45  bone]
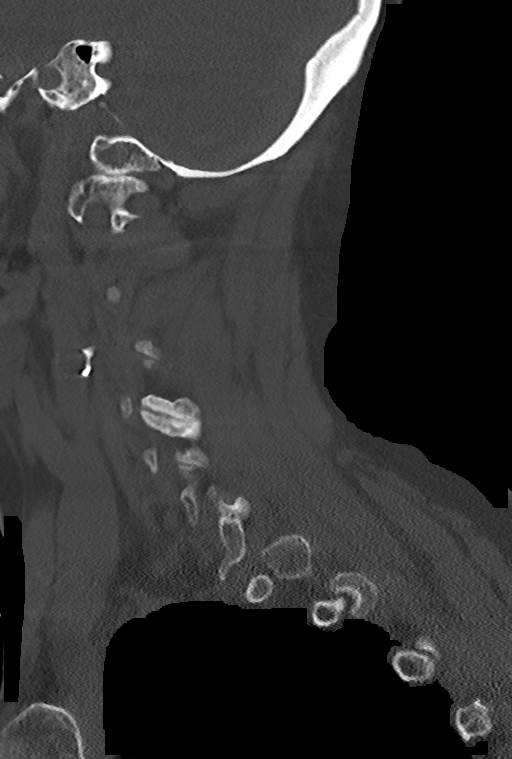

[Series 11: c_spine 2.0 cor bone · coronal · 0.23mm/px · 1 of 46 slices shown]
[im 23/46  bone]
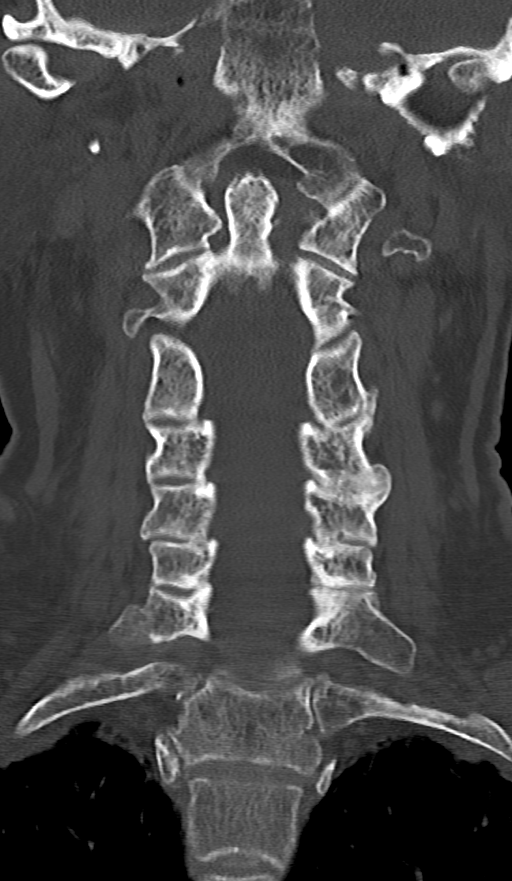

[13 of 33 positions shown; findings below may reference images not displayed]

FINDINGS: CT HEAD FINDINGS

Brain: No evidence of parenchymal hemorrhage or extra-axial fluid
collection. No mass lesion, mass effect, or midline shift. No CT
evidence of acute infarction. Nonspecific moderate subcortical and
periventricular white matter hypodensity, most in keeping with
chronic small vessel ischemic change. Generalized cerebral volume
loss. No ventriculomegaly.

Vascular: No acute abnormality.

Skull: No evidence of calvarial fracture.

Sinuses/Orbits: Partial opacification of the left sphenoid sinus,
not appreciably changed. No new fluid levels.

Other:  The mastoid air cells are unopacified.

CT CERVICAL SPINE FINDINGS

Alignment: Straightening of the cervical spine. Stable minimal 2 mm
anterolisthesis at C4-5. No acute subluxation. Dens is well
positioned between the lateral masses of C1.

Skull base and vertebrae: No acute fracture. No primary bone lesion
or focal pathologic process.

Soft tissues and spinal canal: No prevertebral fluid or swelling. No
visible canal hematoma.

Disc levels: Moderate degenerative disc disease throughout the
cervical spine. Moderate bilateral facet arthropathy. Mild
degenerative foraminal stenosis bilaterally at C5-6 and C6-7,
unchanged.

Upper chest: Negative.

Other: Visualized mastoid air cells appear clear. No discrete
thyroid nodules. No pathologically enlarged cervical nodes. Soft
tissue anchors overlie the right humeral head on the scout topogram.
IMPRESSION: 1. No evidence of acute intracranial abnormality. No evidence of
calvarial fracture .
2. No cervical spine fracture or acute malalignment.
3. Generalized cerebral volume loss and moderate chronic small
vessel ischemia .
4. Mild paranasal sinusitis, probably chronic.
5. Moderate degenerative changes in the cervical spine as detailed.

## 2017-12-22 IMAGING — DX DG FEMUR 2+V*L*
5 series · 5 of 5 positions shown · non-contrast
Comparison: None.

CLINICAL DATA: Right hip pain.  Fall and bruising

EXAM:
LEFT FEMUR 2 VIEWS

[femur ap (1 of 3)]
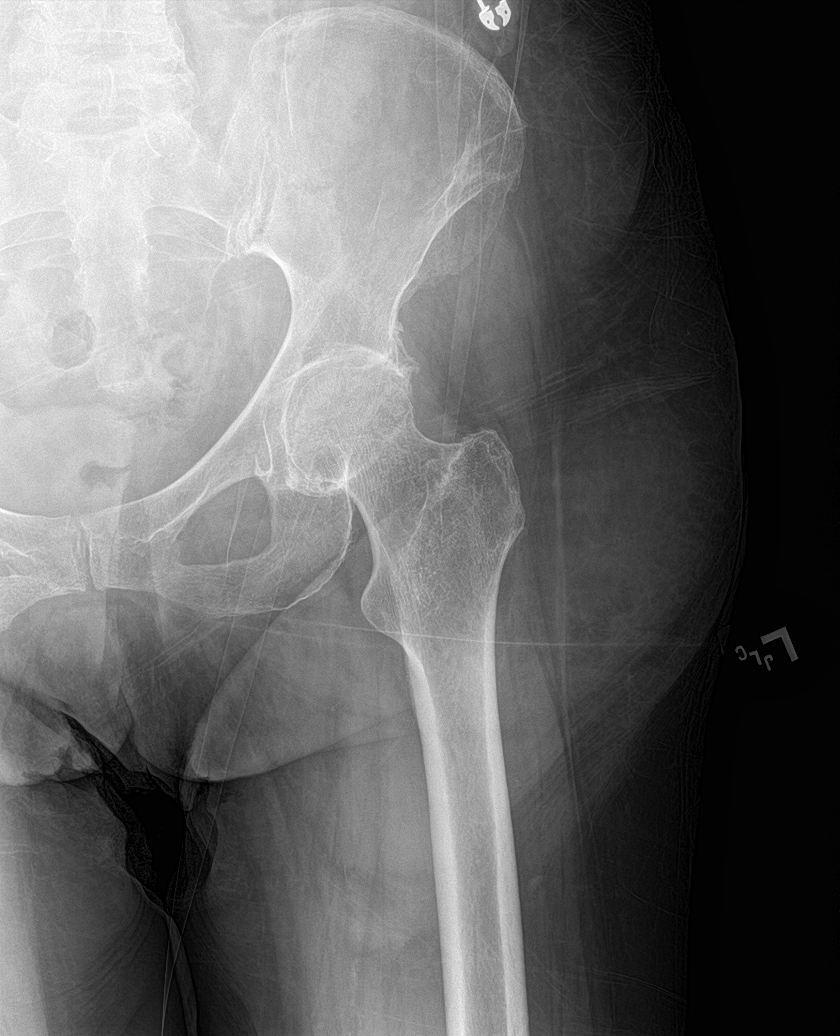

[femur ap (2 of 3)]
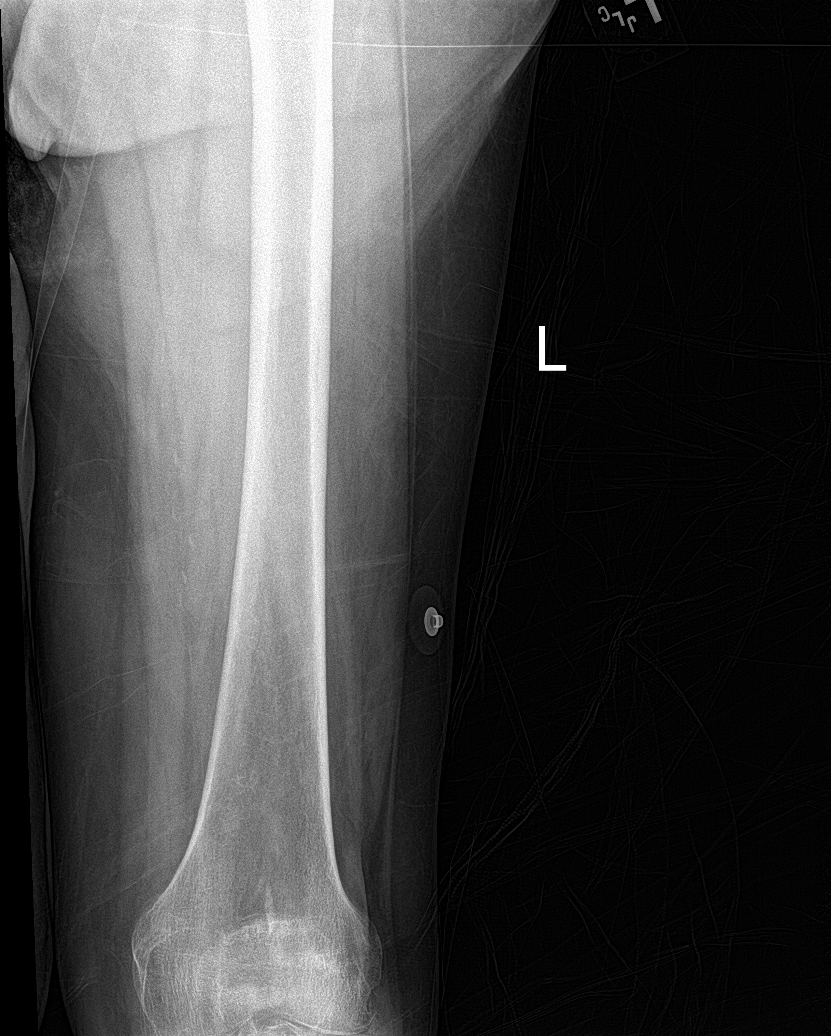

[femur lat (1 of 2)]
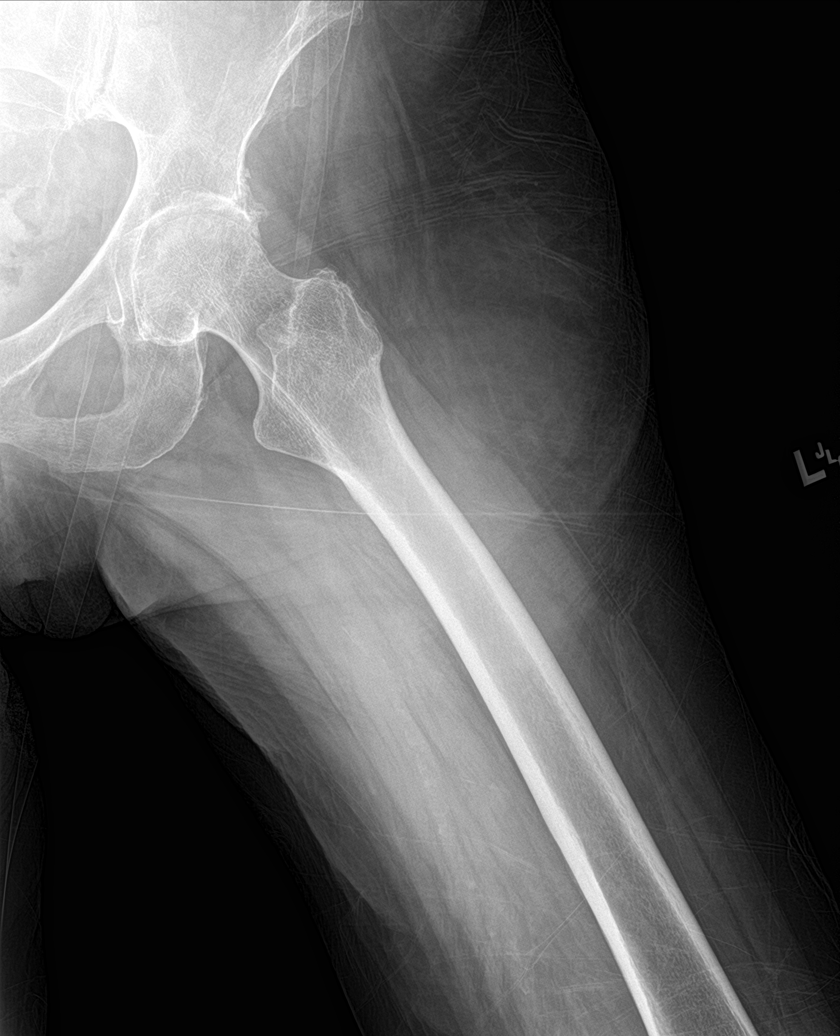

[femur lat (2 of 2)]
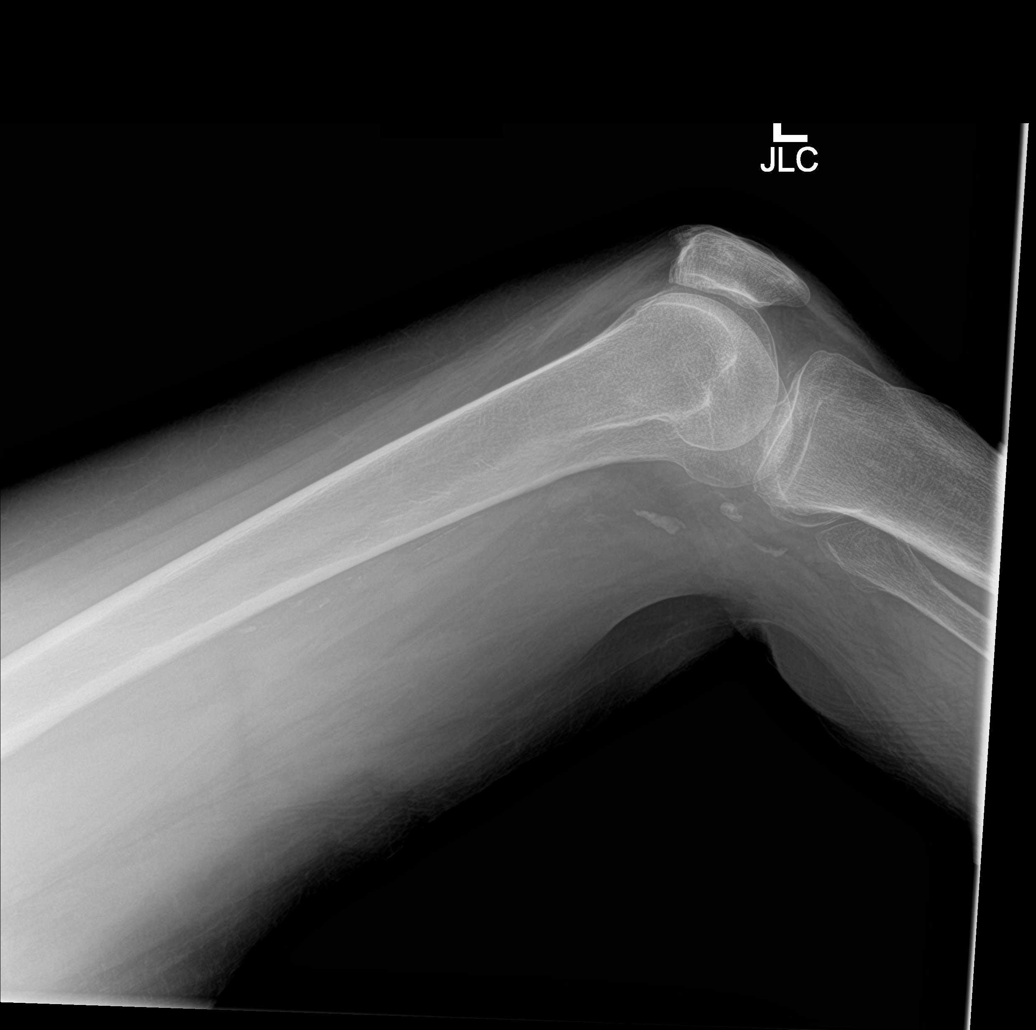

[femur ap (3 of 3)]
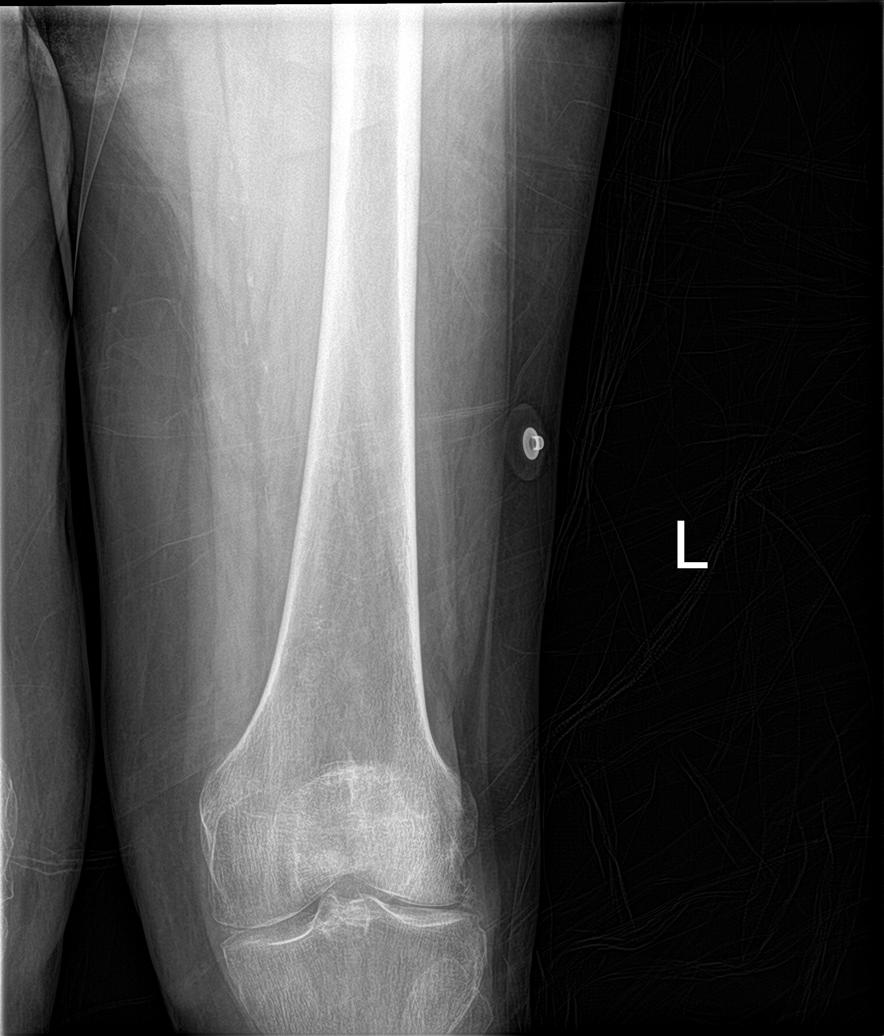

[5 of 5 positions shown; findings below may reference images not displayed]

FINDINGS: Moderate changes of osteoarthritis are noted involving the left hip
and left knee. No displaced fracture. Soft tissues are unremarkable.
IMPRESSION: 1. Moderate changes of osteoarthritis.
2. No fracture identified. If there is high clinical suspicion for
occult fracture or the patient refuses to weightbear, consider
further evaluation with MRI. Although CT is expeditious, evidence is
lacking regarding accuracy of CT over plain film radiography.

## 2017-12-22 IMAGING — DX DG PELVIS 1-2V
2 series · 2 of 2 positions shown · non-contrast
Comparison: Radiograph August 08, 2016.

CLINICAL DATA: Right hip pain, recent falls.

EXAM:
PELVIS - 1-2 VIEW

[pelvis ap (1 of 2)]
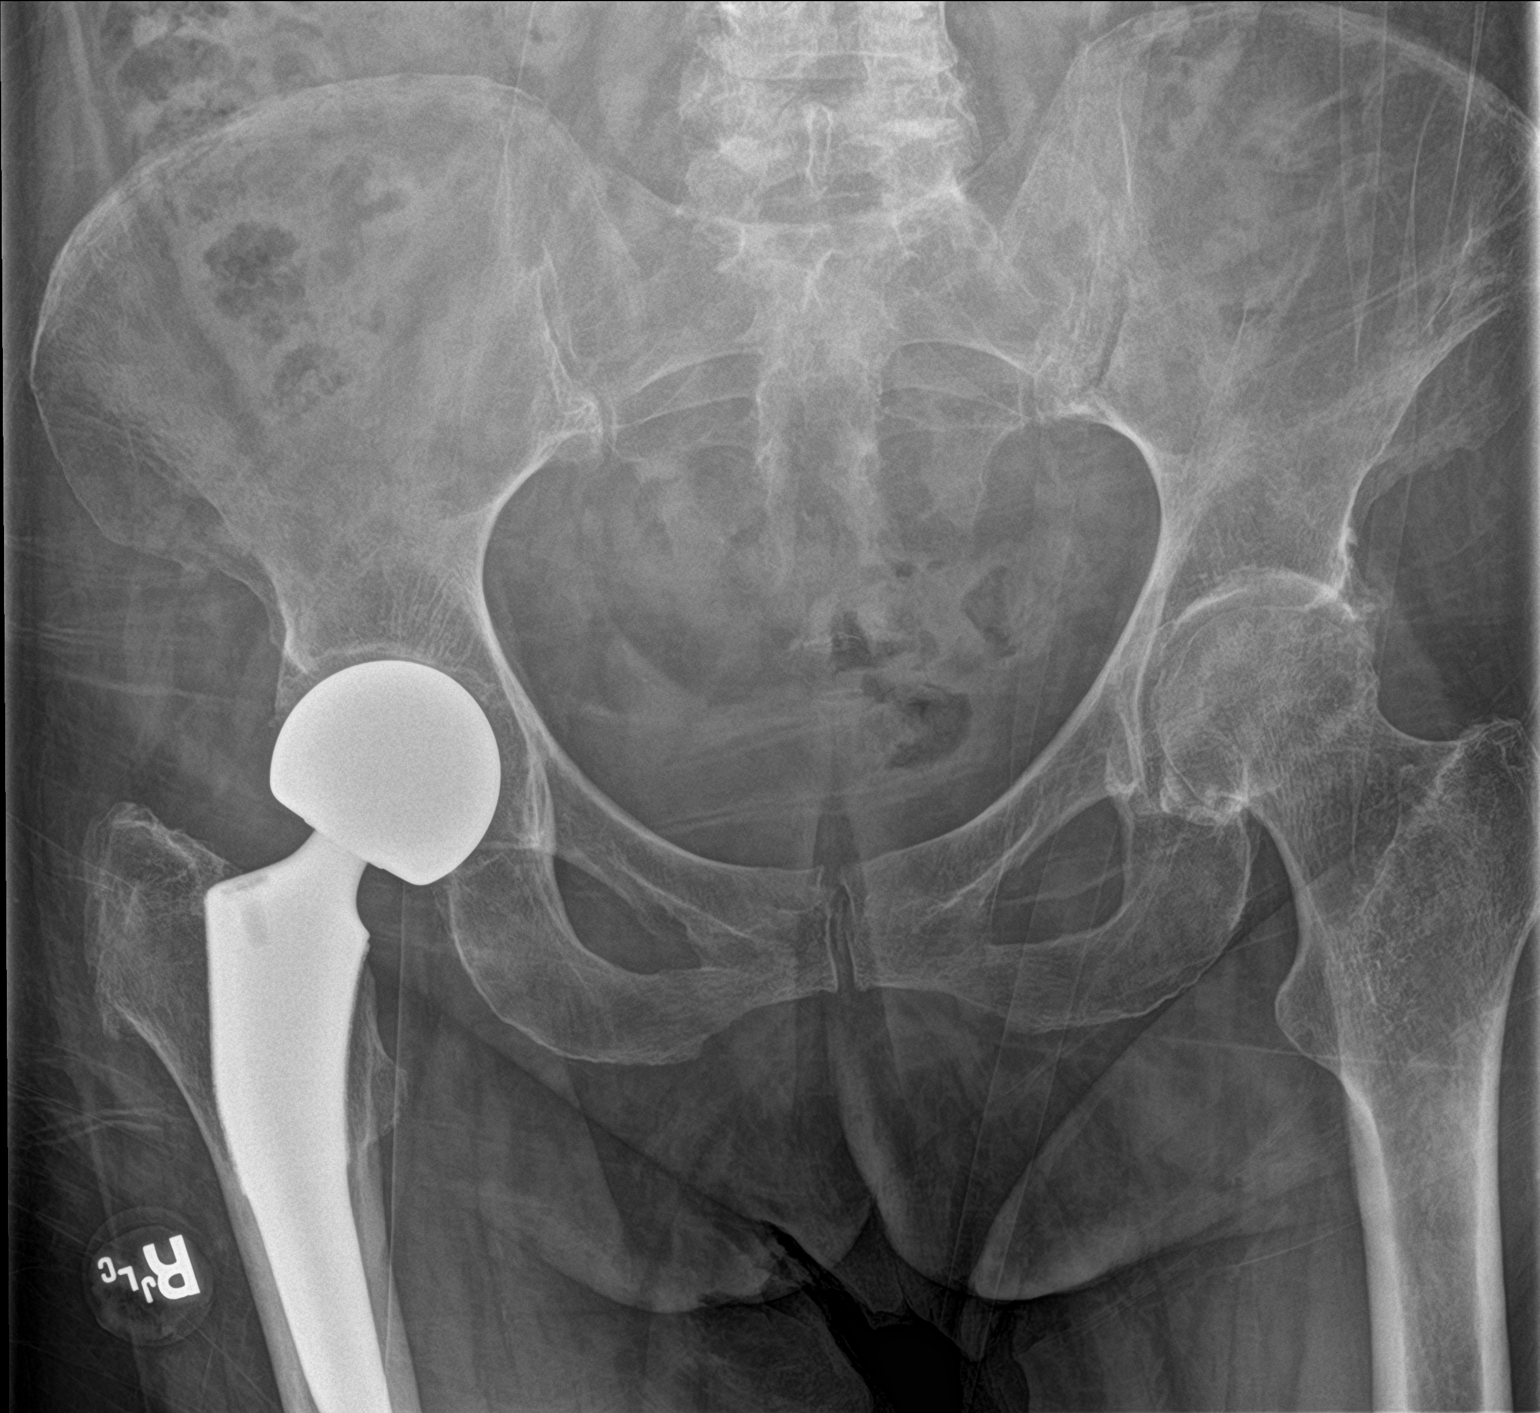

[pelvis ap (2 of 2)]
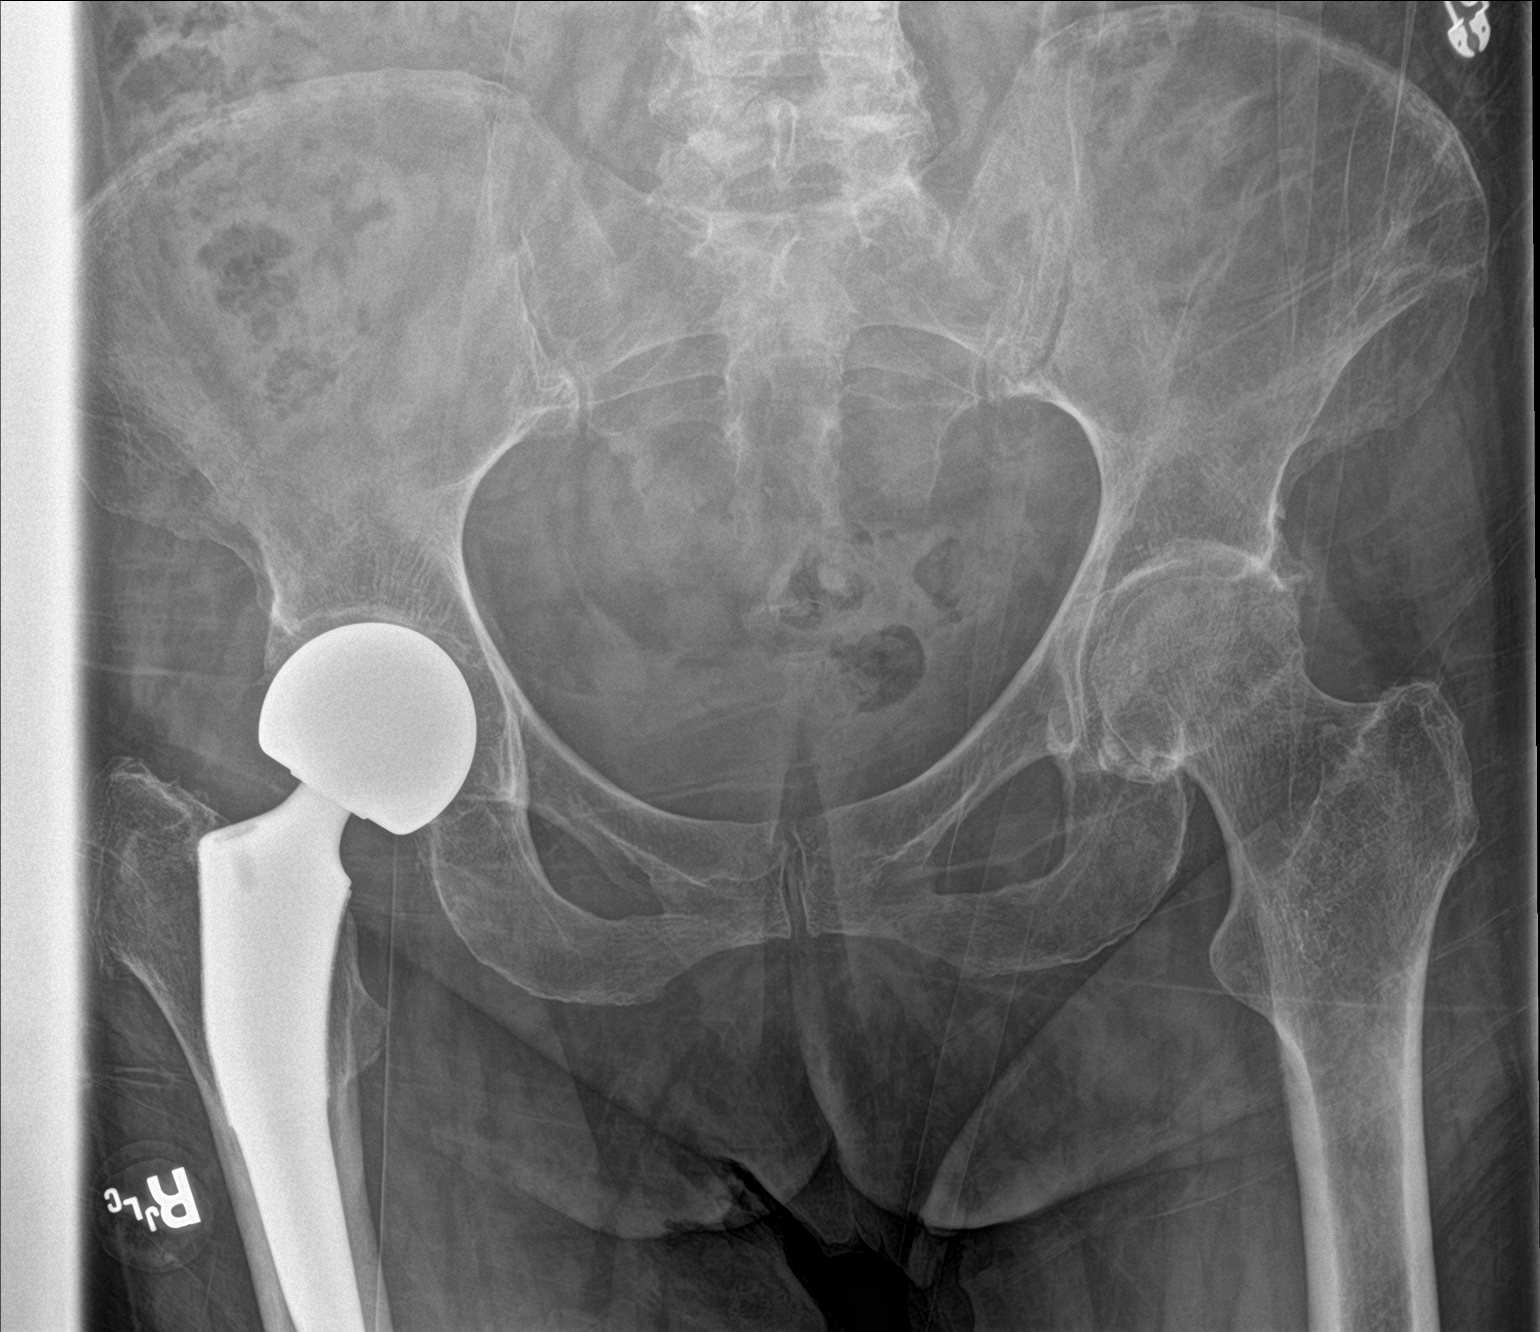

[2 of 2 positions shown; findings below may reference images not displayed]

FINDINGS: Status post right hip arthroplasty. No fracture or dislocation is
noted. Mild degenerative changes seen involving the left hip joint.
Sacroiliac joints are unremarkable.
IMPRESSION: Status post right hip arthroplasty. Mild degenerative joint disease
of left hip. No acute fracture or dislocation is noted.

## 2017-12-22 IMAGING — CR DG CHEST 2V
2 series · 2 of 2 positions shown · non-contrast
Comparison: Radiograph September 16, 2016.

CLINICAL DATA: Nonresponsive.

EXAM:
CHEST  2 VIEW

[chest lat]
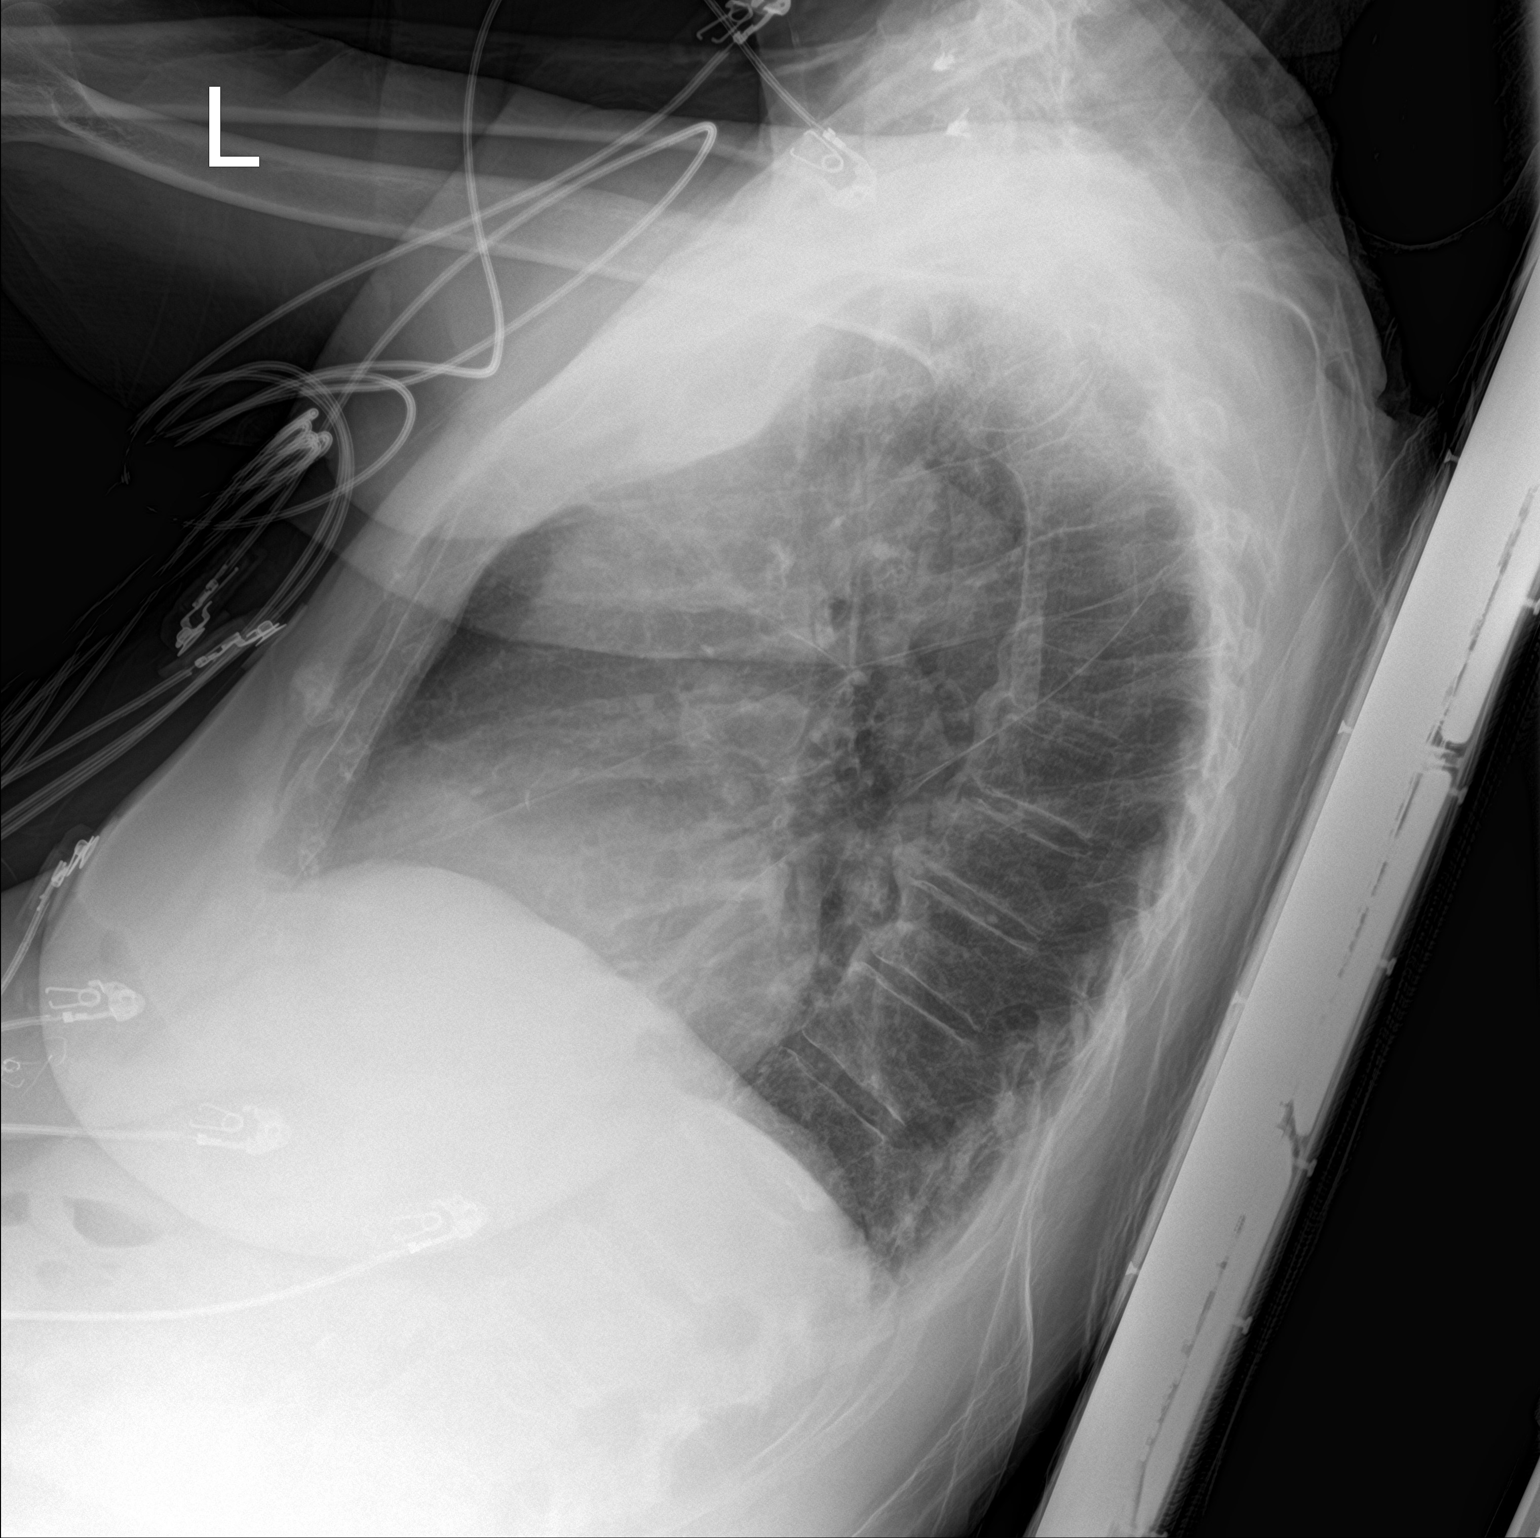

[chest ap]
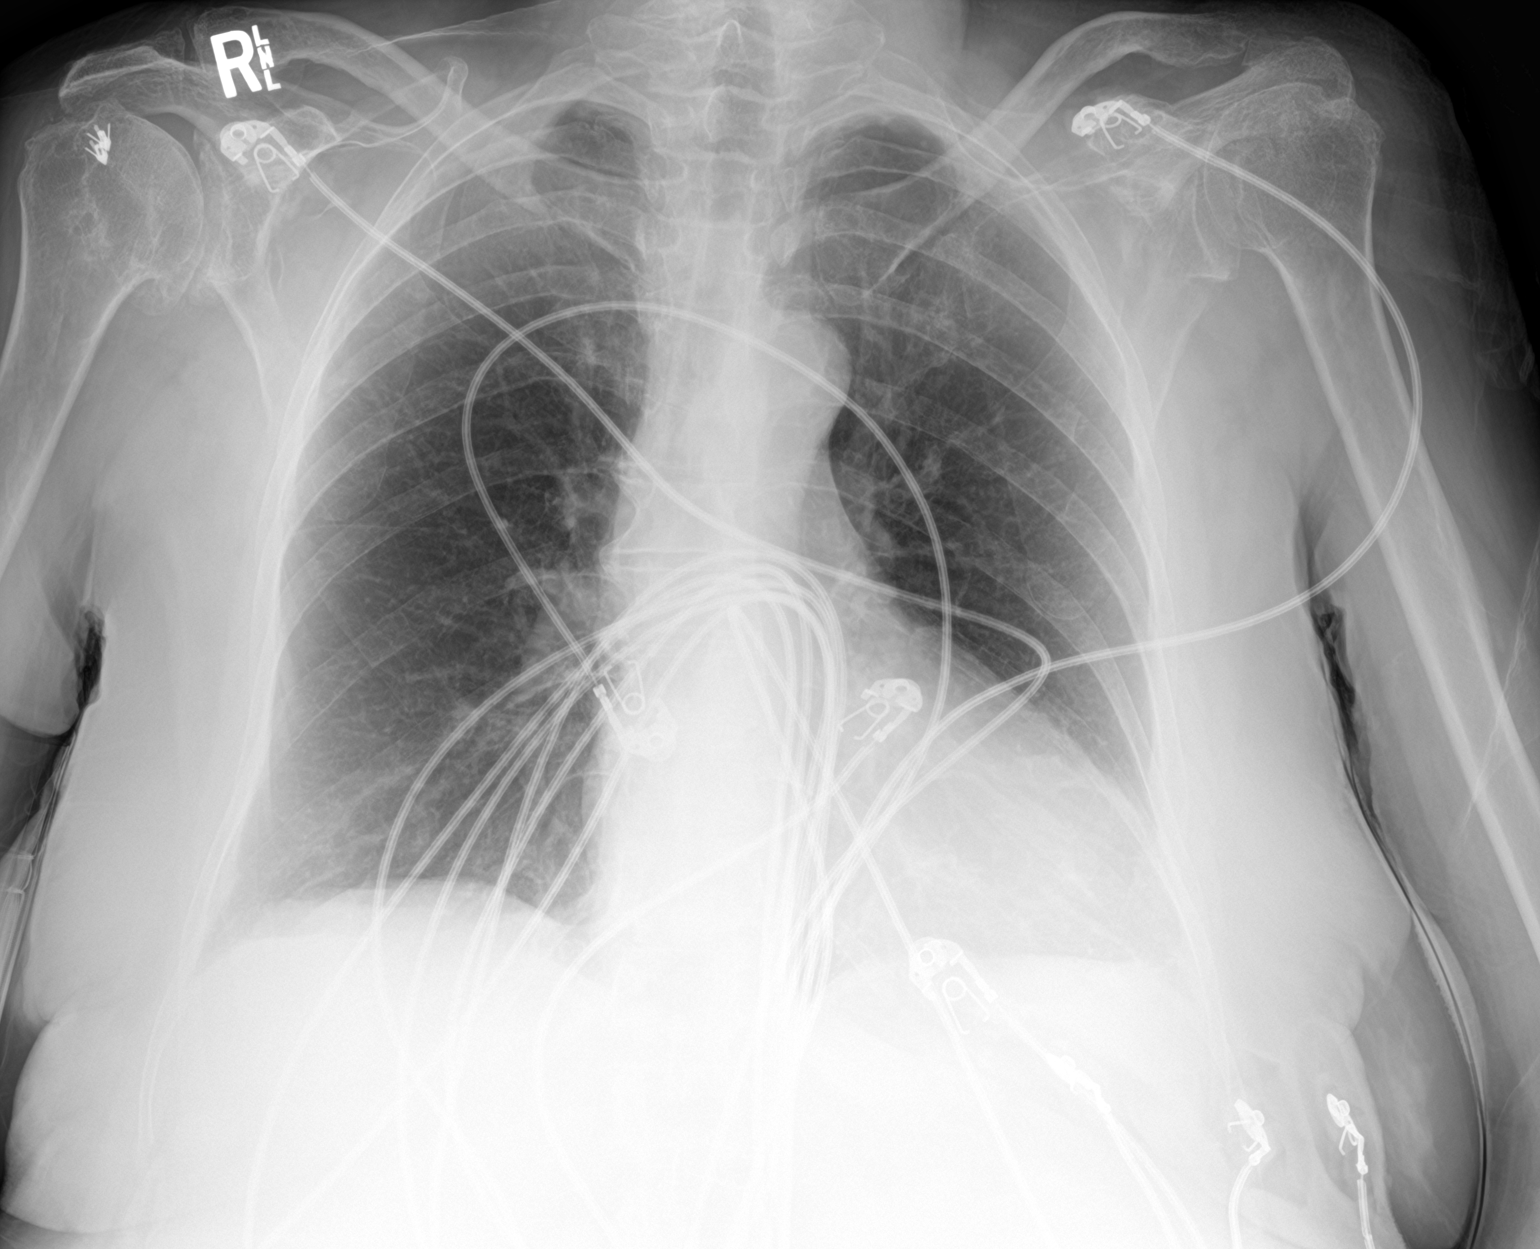

[2 of 2 positions shown; findings below may reference images not displayed]

FINDINGS: Stable cardiomediastinal silhouette. No pneumothorax or pleural
effusion is noted. No acute pulmonary disease is noted. Mild
degenerative changes seen involving the right glenohumeral joint.
Narrowing of the left subacromial space is noted suggesting rotator
cuff injury.
IMPRESSION: No active cardiopulmonary disease.

## 2017-12-23 DIAGNOSIS — G0430 Acute necrotizing hemorrhagic encephalopathy, unspecified: Secondary | ICD-10-CM | POA: Diagnosis not present

## 2017-12-24 DIAGNOSIS — G0430 Acute necrotizing hemorrhagic encephalopathy, unspecified: Secondary | ICD-10-CM | POA: Diagnosis not present

## 2017-12-25 DIAGNOSIS — G0430 Acute necrotizing hemorrhagic encephalopathy, unspecified: Secondary | ICD-10-CM | POA: Diagnosis not present

## 2017-12-26 ENCOUNTER — Other Ambulatory Visit: Payer: Self-pay

## 2017-12-26 ENCOUNTER — Encounter (HOSPITAL_COMMUNITY): Payer: Self-pay

## 2017-12-26 ENCOUNTER — Inpatient Hospital Stay (HOSPITAL_COMMUNITY)
Admission: EM | Admit: 2017-12-26 | Discharge: 2018-01-02 | DRG: 193 | Disposition: A | Discharge status: Nursing Home (Includes ICF) | Payer: Medicare HMO | Attending: Internal Medicine | Admitting: Internal Medicine

## 2017-12-26 ENCOUNTER — Emergency Department (HOSPITAL_COMMUNITY): Payer: Medicare HMO

## 2017-12-26 DIAGNOSIS — R05 Cough: Secondary | ICD-10-CM | POA: Diagnosis not present

## 2017-12-26 DIAGNOSIS — Z7982 Long term (current) use of aspirin: Secondary | ICD-10-CM

## 2017-12-26 DIAGNOSIS — Z86010 Personal history of colonic polyps: Secondary | ICD-10-CM

## 2017-12-26 DIAGNOSIS — R4020 Unspecified coma: Secondary | ICD-10-CM | POA: Diagnosis not present

## 2017-12-26 DIAGNOSIS — D72829 Elevated white blood cell count, unspecified: Secondary | ICD-10-CM | POA: Diagnosis not present

## 2017-12-26 DIAGNOSIS — Z743 Need for continuous supervision: Secondary | ICD-10-CM | POA: Diagnosis not present

## 2017-12-26 DIAGNOSIS — D638 Anemia in other chronic diseases classified elsewhere: Secondary | ICD-10-CM | POA: Diagnosis present

## 2017-12-26 DIAGNOSIS — N390 Urinary tract infection, site not specified: Secondary | ICD-10-CM | POA: Diagnosis not present

## 2017-12-26 DIAGNOSIS — F32A Depression, unspecified: Secondary | ICD-10-CM | POA: Diagnosis present

## 2017-12-26 DIAGNOSIS — E039 Hypothyroidism, unspecified: Secondary | ICD-10-CM | POA: Diagnosis present

## 2017-12-26 DIAGNOSIS — R000 Tachycardia, unspecified: Secondary | ICD-10-CM | POA: Diagnosis not present

## 2017-12-26 DIAGNOSIS — E871 Hypo-osmolality and hyponatremia: Secondary | ICD-10-CM | POA: Diagnosis not present

## 2017-12-26 DIAGNOSIS — E870 Hyperosmolality and hypernatremia: Secondary | ICD-10-CM | POA: Diagnosis present

## 2017-12-26 DIAGNOSIS — R279 Unspecified lack of coordination: Secondary | ICD-10-CM | POA: Diagnosis not present

## 2017-12-26 DIAGNOSIS — J45909 Unspecified asthma, uncomplicated: Secondary | ICD-10-CM | POA: Diagnosis present

## 2017-12-26 DIAGNOSIS — Z87891 Personal history of nicotine dependence: Secondary | ICD-10-CM

## 2017-12-26 DIAGNOSIS — R4182 Altered mental status, unspecified: Secondary | ICD-10-CM | POA: Diagnosis present

## 2017-12-26 DIAGNOSIS — F0281 Dementia in other diseases classified elsewhere with behavioral disturbance: Secondary | ICD-10-CM | POA: Diagnosis present

## 2017-12-26 DIAGNOSIS — G0430 Acute necrotizing hemorrhagic encephalopathy, unspecified: Secondary | ICD-10-CM | POA: Diagnosis not present

## 2017-12-26 DIAGNOSIS — Z885 Allergy status to narcotic agent status: Secondary | ICD-10-CM

## 2017-12-26 DIAGNOSIS — E860 Dehydration: Secondary | ICD-10-CM | POA: Diagnosis present

## 2017-12-26 DIAGNOSIS — R410 Disorientation, unspecified: Secondary | ICD-10-CM | POA: Diagnosis not present

## 2017-12-26 DIAGNOSIS — R0902 Hypoxemia: Secondary | ICD-10-CM | POA: Diagnosis present

## 2017-12-26 DIAGNOSIS — G9341 Metabolic encephalopathy: Secondary | ICD-10-CM | POA: Diagnosis present

## 2017-12-26 DIAGNOSIS — F329 Major depressive disorder, single episode, unspecified: Secondary | ICD-10-CM | POA: Diagnosis present

## 2017-12-26 DIAGNOSIS — Z66 Do not resuscitate: Secondary | ICD-10-CM | POA: Diagnosis present

## 2017-12-26 DIAGNOSIS — Z8249 Family history of ischemic heart disease and other diseases of the circulatory system: Secondary | ICD-10-CM | POA: Diagnosis not present

## 2017-12-26 DIAGNOSIS — Z79899 Other long term (current) drug therapy: Secondary | ICD-10-CM | POA: Diagnosis not present

## 2017-12-26 DIAGNOSIS — G2581 Restless legs syndrome: Secondary | ICD-10-CM | POA: Diagnosis not present

## 2017-12-26 DIAGNOSIS — Z79890 Hormone replacement therapy (postmenopausal): Secondary | ICD-10-CM

## 2017-12-26 DIAGNOSIS — Z90710 Acquired absence of both cervix and uterus: Secondary | ICD-10-CM

## 2017-12-26 DIAGNOSIS — N182 Chronic kidney disease, stage 2 (mild): Secondary | ICD-10-CM | POA: Diagnosis present

## 2017-12-26 DIAGNOSIS — R404 Transient alteration of awareness: Secondary | ICD-10-CM | POA: Diagnosis not present

## 2017-12-26 DIAGNOSIS — I1310 Hypertensive heart and chronic kidney disease without heart failure, with stage 1 through stage 4 chronic kidney disease, or unspecified chronic kidney disease: Secondary | ICD-10-CM | POA: Diagnosis present

## 2017-12-26 DIAGNOSIS — Z87440 Personal history of urinary (tract) infections: Secondary | ICD-10-CM

## 2017-12-26 DIAGNOSIS — Z781 Physical restraint status: Secondary | ICD-10-CM | POA: Diagnosis not present

## 2017-12-26 DIAGNOSIS — K529 Noninfective gastroenteritis and colitis, unspecified: Secondary | ICD-10-CM | POA: Diagnosis present

## 2017-12-26 DIAGNOSIS — R509 Fever, unspecified: Secondary | ICD-10-CM | POA: Diagnosis not present

## 2017-12-26 DIAGNOSIS — Z96641 Presence of right artificial hip joint: Secondary | ICD-10-CM | POA: Diagnosis present

## 2017-12-26 DIAGNOSIS — Z9089 Acquired absence of other organs: Secondary | ICD-10-CM

## 2017-12-26 DIAGNOSIS — J129 Viral pneumonia, unspecified: Secondary | ICD-10-CM | POA: Diagnosis not present

## 2017-12-26 DIAGNOSIS — J9811 Atelectasis: Secondary | ICD-10-CM | POA: Diagnosis not present

## 2017-12-26 DIAGNOSIS — G309 Alzheimer's disease, unspecified: Secondary | ICD-10-CM | POA: Diagnosis present

## 2017-12-26 DIAGNOSIS — I4510 Unspecified right bundle-branch block: Secondary | ICD-10-CM | POA: Diagnosis not present

## 2017-12-26 DIAGNOSIS — F0280 Dementia in other diseases classified elsewhere without behavioral disturbance: Secondary | ICD-10-CM | POA: Diagnosis not present

## 2017-12-26 DIAGNOSIS — F05 Delirium due to known physiological condition: Secondary | ICD-10-CM | POA: Diagnosis not present

## 2017-12-26 DIAGNOSIS — Z9049 Acquired absence of other specified parts of digestive tract: Secondary | ICD-10-CM

## 2017-12-26 DIAGNOSIS — F0390 Unspecified dementia without behavioral disturbance: Secondary | ICD-10-CM | POA: Diagnosis present

## 2017-12-26 LAB — CBC WITH DIFFERENTIAL/PLATELET
BASOS PCT: 0 %
Basophils Absolute: 0 10*3/uL (ref 0.0–0.1)
EOS PCT: 0 %
Eosinophils Absolute: 0 10*3/uL (ref 0.0–0.7)
HEMATOCRIT: 35.1 % — AB (ref 36.0–46.0)
HEMOGLOBIN: 11.7 g/dL — AB (ref 12.0–15.0)
LYMPHS ABS: 1.3 10*3/uL (ref 0.7–4.0)
Lymphocytes Relative: 5 %
MCH: 30.3 pg (ref 26.0–34.0)
MCHC: 33.3 g/dL (ref 30.0–36.0)
MCV: 90.9 fL (ref 78.0–100.0)
MONOS PCT: 3 %
Monocytes Absolute: 0.8 10*3/uL (ref 0.1–1.0)
NEUTROS ABS: 23.6 10*3/uL — AB (ref 1.7–7.7)
Neutrophils Relative %: 92 %
Platelets: 197 10*3/uL (ref 150–400)
RBC: 3.86 MIL/uL — ABNORMAL LOW (ref 3.87–5.11)
RDW: 14.1 % (ref 11.5–15.5)
WBC Morphology: INCREASED
WBC: 25.7 10*3/uL — ABNORMAL HIGH (ref 4.0–10.5)

## 2017-12-26 LAB — COMPREHENSIVE METABOLIC PANEL
ALBUMIN: 3.1 g/dL — AB (ref 3.5–5.0)
ALT: 24 U/L (ref 0–44)
AST: 24 U/L (ref 15–41)
Alkaline Phosphatase: 83 U/L (ref 38–126)
Anion gap: 6 (ref 5–15)
BUN: 28 mg/dL — ABNORMAL HIGH (ref 8–23)
CO2: 29 mmol/L (ref 22–32)
CREATININE: 1 mg/dL (ref 0.44–1.00)
Calcium: 9.1 mg/dL (ref 8.9–10.3)
Chloride: 106 mmol/L (ref 98–111)
GFR calc Af Amer: 54 mL/min — ABNORMAL LOW (ref >60)
GFR calc non Af Amer: 46 mL/min — ABNORMAL LOW (ref >60)
Glucose, Bld: 125 mg/dL — ABNORMAL HIGH (ref 70–99)
Potassium: 4.9 mmol/L (ref 3.5–5.1)
Sodium: 141 mmol/L (ref 135–145)
Total Bilirubin: 0.5 mg/dL (ref 0.3–1.2)
Total Protein: 6.6 g/dL (ref 6.5–8.1)

## 2017-12-26 LAB — CBG MONITORING, ED: Glucose-Capillary: 109 mg/dL — ABNORMAL HIGH (ref 70–99)

## 2017-12-26 LAB — URINALYSIS, ROUTINE W REFLEX MICROSCOPIC
BACTERIA UA: NONE SEEN
BILIRUBIN URINE: NEGATIVE
Glucose, UA: NEGATIVE mg/dL
Hgb urine dipstick: NEGATIVE
Ketones, ur: NEGATIVE mg/dL
Nitrite: NEGATIVE
PROTEIN: NEGATIVE mg/dL
Specific Gravity, Urine: 1.017 (ref 1.005–1.030)
pH: 7 (ref 5.0–8.0)

## 2017-12-26 LAB — I-STAT CG4 LACTIC ACID, ED
LACTIC ACID, VENOUS: 1.44 mmol/L (ref 0.5–1.9)
Lactic Acid, Venous: 2.08 mmol/L (ref 0.5–1.9)

## 2017-12-26 LAB — MRSA PCR SCREENING: MRSA by PCR: NEGATIVE

## 2017-12-26 LAB — PROTIME-INR
INR: 0.89
Prothrombin Time: 11.9 seconds (ref 11.4–15.2)

## 2017-12-26 LAB — PROCALCITONIN: Procalcitonin: 0.1 ng/mL

## 2017-12-26 MED ORDER — MELATONIN 3 MG PO TABS
3.0000 mg | ORAL_TABLET | Freq: Every day | ORAL | Status: DC
  Administered 2017-12-27 – 2018-01-01 (×6): 3 mg via ORAL
  Filled 2017-12-26 (×7): qty 1

## 2017-12-26 MED ORDER — BACITRACIN-NEOMYCIN-POLYMYXIN OINTMENT TUBE: 1.0000 "application " | TOPICAL_OINTMENT | Freq: Three times a day (TID) | CUTANEOUS | Status: DC | PRN

## 2017-12-26 MED ORDER — VANCOMYCIN HCL IN DEXTROSE 1-5 GM/200ML-% IV SOLN: 1000.0000 mg | INTRAVENOUS | Status: DC

## 2017-12-26 MED ORDER — SENNA 8.6 MG PO TABS
17.2000 mg | ORAL_TABLET | Freq: Two times a day (BID) | ORAL | Status: DC
  Administered 2017-12-27 – 2018-01-02 (×13): 17.2 mg via ORAL
  Filled 2017-12-26 (×13): qty 2

## 2017-12-26 MED ORDER — CLONAZEPAM 0.125 MG PO TBDP
0.2500 mg | ORAL_TABLET | Freq: Two times a day (BID) | ORAL | Status: DC
  Administered 2017-12-26 – 2017-12-27 (×2): 0.25 mg via ORAL
  Filled 2017-12-26 (×2): qty 2

## 2017-12-26 MED ORDER — ACETAMINOPHEN 325 MG PO TABS
650.0000 mg | ORAL_TABLET | Freq: Four times a day (QID) | ORAL | Status: DC | PRN
  Administered 2017-12-27: 650 mg via ORAL
  Filled 2017-12-26: qty 2

## 2017-12-26 MED ORDER — SODIUM CHLORIDE 0.9 % IV BOLUS
1000.0000 mL | Freq: Once | INTRAVENOUS | Status: AC
  Administered 2017-12-26: 1000 mL via INTRAVENOUS

## 2017-12-26 MED ORDER — MAGNESIUM HYDROXIDE 400 MG/5ML PO SUSP: 30.0000 mL | Freq: Every day | ORAL | Status: DC | PRN

## 2017-12-26 MED ORDER — HALOPERIDOL LACTATE 5 MG/ML IJ SOLN
2.0000 mg | Freq: Once | INTRAMUSCULAR | Status: AC
  Administered 2017-12-28: 2 mg via INTRAVENOUS
  Filled 2017-12-26 (×2): qty 1

## 2017-12-26 MED ORDER — LOPERAMIDE HCL 2 MG PO CAPS: 2.0000 mg | ORAL_CAPSULE | ORAL | Status: DC | PRN

## 2017-12-26 MED ORDER — GUAIFENESIN 100 MG/5ML PO SOLN
200.0000 mg | Freq: Four times a day (QID) | ORAL | Status: DC | PRN
  Administered 2017-12-29: 200 mg via ORAL
  Filled 2017-12-26 (×2): qty 10

## 2017-12-26 MED ORDER — LEVOTHYROXINE SODIUM 75 MCG PO TABS
75.0000 ug | ORAL_TABLET | Freq: Every day | ORAL | Status: DC
  Administered 2017-12-27 – 2018-01-02 (×7): 75 ug via ORAL
  Filled 2017-12-26 (×7): qty 1

## 2017-12-26 MED ORDER — RIVASTIGMINE TARTRATE 1.5 MG PO CAPS
1.5000 mg | ORAL_CAPSULE | Freq: Two times a day (BID) | ORAL | Status: DC
  Administered 2017-12-27 – 2018-01-02 (×13): 1.5 mg via ORAL
  Filled 2017-12-26 (×14): qty 1

## 2017-12-26 MED ORDER — ONDANSETRON HCL 4 MG/2ML IJ SOLN: 4.0000 mg | Freq: Four times a day (QID) | INTRAMUSCULAR | Status: DC | PRN

## 2017-12-26 MED ORDER — VANCOMYCIN HCL 10 G IV SOLR
1500.0000 mg | Freq: Once | INTRAVENOUS | Status: AC
  Administered 2017-12-26: 1500 mg via INTRAVENOUS
  Filled 2017-12-26: qty 1500

## 2017-12-26 MED ORDER — ACETAMINOPHEN 650 MG RE SUPP: 650.0000 mg | Freq: Four times a day (QID) | RECTAL | Status: DC | PRN

## 2017-12-26 MED ORDER — IBUPROFEN 200 MG PO TABS
400.0000 mg | ORAL_TABLET | Freq: Four times a day (QID) | ORAL | Status: DC | PRN
  Administered 2017-12-28 – 2017-12-31 (×3): 400 mg via ORAL
  Filled 2017-12-26 (×3): qty 2

## 2017-12-26 MED ORDER — SODIUM CHLORIDE 0.9 % IV SOLN
2.0000 g | INTRAVENOUS | Status: DC
  Administered 2017-12-26: 2 g via INTRAVENOUS
  Filled 2017-12-26: qty 20

## 2017-12-26 MED ORDER — RISPERIDONE 0.25 MG PO TABS
0.5000 mg | ORAL_TABLET | Freq: Every day | ORAL | Status: DC | PRN
  Administered 2017-12-28 – 2017-12-31 (×5): 0.5 mg via ORAL
  Filled 2017-12-26: qty 1
  Filled 2017-12-26: qty 2
  Filled 2017-12-26 (×2): qty 1
  Filled 2017-12-26: qty 2
  Filled 2017-12-26 (×2): qty 1
  Filled 2017-12-26: qty 2

## 2017-12-26 MED ORDER — ORAL CARE MOUTH RINSE
15.0000 mL | Freq: Two times a day (BID) | OROMUCOSAL | Status: DC
  Administered 2017-12-27 – 2018-01-01 (×8): 15 mL via OROMUCOSAL

## 2017-12-26 MED ORDER — ENSURE ENLIVE PO LIQD
1.0000 | Freq: Three times a day (TID) | ORAL | Status: DC
  Administered 2017-12-28 – 2018-01-01 (×13): 237 mL via ORAL

## 2017-12-26 MED ORDER — ADULT MULTIVITAMIN W/MINERALS CH
1.0000 | ORAL_TABLET | Freq: Every day | ORAL | Status: DC
  Administered 2017-12-28 – 2018-01-01 (×5): 1 via ORAL
  Filled 2017-12-26 (×6): qty 1

## 2017-12-26 MED ORDER — SODIUM CHLORIDE 0.9 % IV SOLN
INTRAVENOUS | Status: AC
  Administered 2017-12-26: 20:00:00 via INTRAVENOUS

## 2017-12-26 MED ORDER — DIVALPROEX SODIUM 125 MG PO CSDR
125.0000 mg | DELAYED_RELEASE_CAPSULE | Freq: Three times a day (TID) | ORAL | Status: DC
  Administered 2017-12-26 – 2018-01-02 (×20): 125 mg via ORAL
  Filled 2017-12-26 (×20): qty 1

## 2017-12-26 MED ORDER — ONDANSETRON HCL 4 MG PO TABS: 4.0000 mg | ORAL_TABLET | Freq: Four times a day (QID) | ORAL | Status: DC | PRN

## 2017-12-26 MED ORDER — TRAZODONE HCL 50 MG PO TABS
50.0000 mg | ORAL_TABLET | Freq: Every evening | ORAL | Status: DC | PRN
  Administered 2017-12-28 – 2017-12-31 (×4): 50 mg via ORAL
  Filled 2017-12-26 (×5): qty 1

## 2017-12-26 MED ORDER — ENOXAPARIN SODIUM 40 MG/0.4ML ~~LOC~~ SOLN
40.0000 mg | SUBCUTANEOUS | Status: DC
  Administered 2017-12-26 – 2018-01-01 (×7): 40 mg via SUBCUTANEOUS
  Filled 2017-12-26 (×7): qty 0.4

## 2017-12-26 NOTE — H&P (Signed)
History and Physical    Deborah CorpusStella P Horton ZOX:[id number] DOB: [birth year] DOA: 12/26/2017  PCP: Ron ParkerBowen, [name], MD   Patient coming from: [facility]   Chief Complaint: Altered mental status  HPI: Deborah CorpusStella P Horton is a 84 year old with past medical history relevant for recurrent UTIs, hypothyroidism, dementia with behavioral/mood problems, depression, who was recently admitted and discharged with Aerococcus urinate UTI on 12/01/2017 who presents again with altered mental status and a fever to 100.6 rectally.  Patient is unable to give much history tell me that she feels fine.  Per review of the nursing documentation discussed with the ED physician patient was apparently noted to be have progressive altered mental status and fever at nursing facility.  Patient was ultimately transported here.  Patient herself reports she feels well.  She cannot tell me really any of the symptoms that brought her here.  She reports that she does not have any complaints at this time and does not have a dysuria, abdominal pain, nausea, vomiting, diarrhea, cough, congestion, chest pain, fevers.   ED Course: In the ED patient's vitals were notable for mild hypoxia requiring 2 L nasal cannula.  Chest x-ray was clear.  Urinalysis showed only trace leukocytes.  Labs are notable for a white count of 25.7 with normal white count approximately 3 weeks ago.  Lactate was 2.08.  Blood culture was obtained and patient was given 1 g of ceftriaxone.  Review of Systems: As per HPI otherwise 10 point review of systems negative.   Past Medical History:  Diagnosis Date  . Acute bronchitis 04/10/2013  . Anemia   . Angina   . Anxiety   . Arthritis   . Arthritis 10/02/2012  . Asthma   . Chicken pox   . Chronic kidney disease   . Colon polyps   . Dehydration 08/31/2013  . Dementia   . Depression   . Dysrhythmia   . Headache(784.0)   . Heart murmur   . Hypertension   . Hypothyroidism   . Left leg pain 10/02/2012  . Pneumonia   .  Recurrent upper respiratory infection (URI)   . Shortness of breath   . UTI (urinary tract infection) 12/30/2012  . Valvular heart disease 09/04/2012    Past Surgical History:  Procedure Laterality Date  . ABDOMINAL HYSTERECTOMY    . APPENDECTOMY    . BACK SURGERY    . CHOLECYSTECTOMY    . COLONOSCOPY W/ POLYPECTOMY    . EYE SURGERY     cataract removed and eye lids lifted  . feet surgery    . FRACTURE SURGERY     bilateral arms  . HAND SURGERY    . HIP ARTHROPLASTY Right 09/20/2015   Procedure: ARTHROPLASTY RIGHT  BIPOLAR ANTERIOR HIP (HEMIARTHROPLASTY);  Surgeon: Samson FredericBrian Swinteck, MD;  Location: [facility];  Service: Orthopedics;  Laterality: Right;  . kidney stones    . SHOULDER ARTHROSCOPY    . TONSILLECTOMY    . TONSILLECTOMY    . TUBAL LIGATION       reports that she quit smoking about 76 years ago. Her smoking use included cigarettes. She has a 0.20 pack-year smoking history. She has never used smokeless tobacco. She reports that she does not drink alcohol or use drugs.  Allergies  Allergen Reactions  . Morphine And Related Other (See Comments)    Unknown reaction  . Codeine Rash  . Hydrocodone Rash    Family History  Problem Relation Age of Onset  . Heart disease Mother   .  Heart disease Father   . Heart disease Other   . Birth defects Other    Unacceptable: Noncontributory, unremarkable, or negative. Acceptable: Family history reviewed and not pertinent (If you reviewed it)  Prior to Admission medications   Medication Sig Start Date End Date Taking? Authorizing Provider  acetaminophen (TYLENOL) 500 MG tablet Take 500 mg by mouth every 4 (four) hours as needed for mild pain, moderate pain, fever or headache.    Yes [provider]  aspirin 81 MG chewable tablet Chew 81 mg daily by mouth.    Yes [provider]  clonazePAM (KLONOPIN) 0.5 MG tablet Take 0.5 tablets (0.25 mg total) by mouth 2 (two) times daily. 12/01/17  Yes Zannie Cove, MD    divalproex (DEPAKOTE SPRINKLE) 125 MG capsule Take 1 capsule (125 mg total) by mouth 2 (two) times daily. Patient taking differently: Take 125 mg by mouth 3 (three) times daily.  12/01/17  Yes Zannie Cove, MD  guaifenesin (ROBITUSSIN) 100 MG/5ML syrup Take 200 mg by mouth every 6 (six) hours as needed for cough.    Yes [provider]  levothyroxine (SYNTHROID, LEVOTHROID) 75 MCG tablet Take 1 tablet (75 mcg total) by mouth daily before breakfast. 12/01/17  Yes Zannie Cove, MD  loperamide (IMODIUM) 2 MG capsule Take 2 mg by mouth as needed for diarrhea or loose stools.   Yes [provider]  magnesium hydroxide (MILK OF MAGNESIA) 400 MG/5ML suspension Take 30 mLs by mouth daily as needed for mild constipation.   Yes [provider]  Melatonin 3 MG TABS Take 3 mg by mouth at bedtime.   Yes [provider]  Multiple Vitamins-Minerals (MULTIVITAMIN WITH MINERALS) tablet Take 1 tablet daily with breakfast by mouth. "Akorn/s antioxidant"   Yes [provider]  Neomycin-Bacitracin-Polymyxin (TRIPLE ANTIBIOTIC) [phone] OINT Apply 1 application 3 (three) times daily as needed topically (skin tears/abrasians).    Yes [provider]  Nutritional Supplements (NUTRITIONAL DRINK) LIQD Take 1 Bottle by mouth 3 (three) times daily with meals. *Mighty Shakes*   Yes [provider]  risperiDONE (RISPERDAL) 0.5 MG tablet Take 0.5 mg daily as needed by mouth (severe agitation/anxiety).  12/16/16  Yes [provider]  rivastigmine (EXELON) 1.5 MG capsule Take 1.5 mg by mouth 2 (two) times daily.   Yes [provider]  senna (SENOKOT) 8.6 MG TABS tablet Take 17.2 mg by mouth 2 (two) times daily.    Yes [provider]  traZODone (DESYREL) 50 MG tablet Take 1 tablet (50 mg total) by mouth at bedtime as needed for sleep. 12/01/17  Yes Zannie Cove, MD  amoxicillin (AMOXIL) 500 MG tablet Take 1 tablet (500 mg total) by mouth  2 (two) times daily. For 2days Patient not taking: Reported on 12/26/2017 12/01/17   Zannie Cove, MD    Physical Exam: Vitals:   12/26/17 1600 12/26/17 1615 12/26/17 1630 12/26/17 1645  BP: (!) 112/50 (!) 121/49 (!) 117/53 (!) 120/43  Pulse: 74 74 77 76  Resp: 13 14 13 14   Temp:      TempSrc:      SpO2: 93% 95% 93% 93%  Weight:      Height:        Constitutional: NAD, calm, comfortable Vitals:   12/26/17 1600 12/26/17 1615 12/26/17 1630 12/26/17 1645  BP: (!) 112/50 (!) 121/49 (!) 117/53 (!) 120/43  Pulse: 74 74 77 76  Resp: 13 14 13 14   Temp:      TempSrc:  SpO2: 93% 95% 93% 93%  Weight:      Height:       Eyes: Anicteric sclera ENMT: Edentulous, dry mucous membranes.  Neck: normal, supple Respiratory: clear to auscultation bilaterally, no wheezing, no crackles. Normal respiratory effort. No accessory muscle use.  Cardiovascular: Distant heart sounds, regular rate and rhythm, no murmurs Abdomen: Soft, mildly tender to deep palpation centrally, plus bowel sounds, no rebound or guarding Musculoskeletal: Trace lower extremity edema Skin: Senile purpura Neurologic: Grossly intact, moving all extremities Psychiatric: Unable to assess due to underlying dementia   Labs on Admission: I have personally reviewed following labs and imaging studies  CBC: Recent Labs  Lab 12/26/17 1018  WBC 25.7*  NEUTROABS 23.6*  HGB 11.7*  HCT 35.1*  MCV 90.9  PLT 197   Basic Metabolic Panel: Recent Labs  Lab 12/26/17 1018  NA 141  K 4.9  CL 106  CO2 29  GLUCOSE 125*  BUN 28*  CREATININE 1.00  CALCIUM 9.1   GFR: Estimated Creatinine Clearance: 37.2 mL/min (by C-G formula based on SCr of 1 mg/dL). Liver Function Tests: Recent Labs  Lab 12/26/17 1018  AST 24  ALT 24  ALKPHOS 83  BILITOT 0.5  PROT 6.6  ALBUMIN 3.1*   No results for input(s): LIPASE, AMYLASE in the last 168 hours. No results for input(s): AMMONIA in the last 168 hours. Coagulation  Profile: Recent Labs  Lab 12/26/17 1018  INR 0.89   Cardiac Enzymes: No results for input(s): CKTOTAL, CKMB, CKMBINDEX, TROPONINI in the last 168 hours. BNP (last 3 results) No results for input(s): PROBNP in the last 8760 hours. HbA1C: No results for input(s): HGBA1C in the last 72 hours. CBG: No results for input(s): GLUCAP in the last 168 hours. Lipid Profile: No results for input(s): CHOL, HDL, LDLCALC, TRIG, CHOLHDL, LDLDIRECT in the last 72 hours. Thyroid Function Tests: No results for input(s): TSH, T4TOTAL, FREET4, T3FREE, THYROIDAB in the last 72 hours. Anemia Panel: No results for input(s): VITAMINB12, FOLATE, FERRITIN, TIBC, IRON, RETICCTPCT in the last 72 hours. Urine analysis:    Component Value Date/Time   COLORURINE YELLOW 12/26/2017 1015   APPEARANCEUR CLEAR 12/26/2017 1015   LABSPEC 1.017 12/26/2017 1015   PHURINE 7.0 12/26/2017 1015   GLUCOSEU NEGATIVE 12/26/2017 1015   GLUCOSEU NEGATIVE 04/06/2014 1127   HGBUR NEGATIVE 12/26/2017 1015   BILIRUBINUR NEGATIVE 12/26/2017 1015   BILIRUBINUR small 03/14/2014 1151   KETONESUR NEGATIVE 12/26/2017 1015   PROTEINUR NEGATIVE 12/26/2017 1015   UROBILINOGEN 0.2 03/23/2015 0053   NITRITE NEGATIVE 12/26/2017 1015   LEUKOCYTESUR TRACE (A) 12/26/2017 1015    Radiological Exams on Admission: Dg Chest 2 View  Result Date: 12/26/2017 CLINICAL DATA:  Increasing fevers EXAM: CHEST - 2 VIEW COMPARISON:  11/27/2017 FINDINGS: Cardiac shadows within normal limits. Lungs are hyperinflated without focal infiltrate or sizable effusion. Degenerative change of the thoracic spine is seen. IMPRESSION: No acute abnormality noted. Electronically Signed   By: Alcide Clever M.D.   On: 12/26/2017 10:42    EKG: Independently reviewed.  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  This would be  technically criteria for sepsis.  Her UA is fairly bland, there is no evidence of a intra-abdominal catastrophe or cause of her abdominal pain, she does not have any diarrhea, chest x-ray is clean.  She is unfortunately at high risk for having hospital-acquired infection due to her recent hospitalization. -IV ceftriaxone and vancomycin ordered 12/26/2017 -Follow blood cultures ordered on 12/26/2017 -Procalcitonin ordered  #) Dementia with behavioral problems: -Continue realistic mean 1.5 mg twice daily -Continue risperidone 0.5 mg as needed -Continue clonazepam 0.25 mg twice daily -Continue divalproex 125 mg 3 times daily  #) Hypothyroidism: -Continue levothyroxine 75 mcg daily  #) Aspirin use: There is unclear why this patient is on aspirin.  It is not useful for primary prophylaxis at this age she does not have any medical problems on her list that would necessarily need aspirin any longer. -Consider discharging, will not restart here  #) Loose stool/chronic diarrhea: - Continue PRN loperamide  Fluids: Gentle IV fluids Elect lites: Monitor and supplement Nutrition: Regular diet  Prophylaxis: Enoxaparin  Disposition: Pending resolution of altered mental status back to [facility]  DO NOT RESUSCITATE    Delaine Lame MD [facility]  If 7PM-7AM, please contact night-coverage [email or web address] Password [email or web address]  12/26/2017, 5:11 PM

## 2017-12-26 NOTE — ED Notes (Addendum)
CBG taken.result: 109

## 2017-12-26 NOTE — ED Notes (Signed)
Bed: WA20 Expected date:  Expected time:  Means of arrival:  Comments: 84 yo AMS

## 2017-12-26 NOTE — ED Triage Notes (Signed)
[facility]. Pt brought in via [facility]. Staff reported that over the past 12 hours pt has has increased temp and worsening altered mental status. Pt has just recently finished antibiotic therapy for UTI. Pt current GCS is 9. Pt is altered.   Pt did have some apneic episodes will in route. O2 and fluids have helped patient. Facility stated they gave her 500 of tylenol however unsure of due to pt currently being unable to swallow due to Altered Mental Status.   Pt has DNR at bedside.

## 2017-12-26 NOTE — ED Notes (Signed)
ED Provider at bedside. 

## 2017-12-26 NOTE — ED Notes (Signed)
O2 placed on patient.

## 2017-12-26 NOTE — ED Notes (Signed)
ED TO INPATIENT HANDOFF REPORT  Name/Age/Gender [name] 84 y.o. female  Code Status    Code Status Orders  (From admission, onward)        Start     Ordered   12/26/17 1825  Do not attempt resuscitation (DNR)  Continuous    Question Answer Comment  In the event of cardiac or respiratory ARREST Do not call a "code blue"   In the event of cardiac or respiratory ARREST Do not perform Intubation, CPR, defibrillation or ACLS   In the event of cardiac or respiratory ARREST Use medication by any route, position, wound care, and other measures to relive pain and suffering. May use oxygen, suction and manual treatment of airway obstruction as needed for comfort.      12/26/17 1824    Code Status History    Date Active Date Inactive Code Status Order ID Comments User Context   11/28/2017 0120 12/01/2017 2129 DNR [id number]  Ivor Costa, MD ED   09/16/2016 0744 09/18/2016 1902 DNR [id number]  Brenton Grills, PA-C ED   09/20/2015 0049 09/23/2015 1841 DNR [id number]  Etta Quill, DO ED   06/26/2015 2040 06/28/2015 1824 Full Code [id number]  Theodis Blaze, MD Inpatient   02/13/2015 0127 02/15/2015 1741 DNR [id number]  Lavina Hamman, MD ED   01/08/2015 1115 01/16/2015 2104 DNR [id number]  Louellen Molder, MD Inpatient   11/27/2014 1939 11/29/2014 1931 DNR [id number]  Samella Parr, NP Inpatient   11/25/2014 2355 11/26/2014 2203 DNR [id number]  Nita Sells, MD Inpatient   10/26/2014 1315 10/28/2014 1555 Full Code [id number]  Samella Parr, NP Inpatient   10/02/2013 0137 10/04/2013 2114 Full Code [id number]  Theressa Millard, MD Inpatient   12/18/2012 2002 12/21/2012 1529 Full Code [id number]  Debbe Odea, MD ED   08/15/2012 1632 08/17/2012 1951 Full Code [id number]  Robbie Lis, MD Inpatient   09/07/2011 0249 09/08/2011 1832 Full Code [id number]  Bennye Alm, RN Inpatient      Home/SNF/Other Nursing Home  Chief Complaint alt mental status - fever  Level of Care/Admitting  Diagnosis ED Disposition    ED Disposition Condition [facility] Area: [facility] [[id number]]  Level of Care: Med-Surg [16]  Diagnosis: Altered mental status [780.97.ICD-9-CM]  Admitting Physician: Cristy Folks [[id number]]  Attending Physician: Cristy Folks [[id number]]  PT Class (Do Not Modify): Observation [104]  PT Acc Code (Do Not Modify): Observation [10022]       Medical History Past Medical History:  Diagnosis Date  . Acute bronchitis 04/10/2013  . Anemia   . Angina   . Anxiety   . Arthritis   . Arthritis 10/02/2012  . Asthma   . Chicken pox   . Chronic kidney disease   . Colon polyps   . Dehydration 08/31/2013  . Dementia   . Depression   . Dysrhythmia   . Headache(784.0)   . Heart murmur   . Hypertension   . Hypothyroidism   . Left leg pain 10/02/2012  . Pneumonia   . Recurrent upper respiratory infection (URI)   . Shortness of breath   . UTI (urinary tract infection) 12/30/2012  . Valvular heart disease 09/04/2012    Allergies Allergies  Allergen Reactions  . Morphine And Related Other (See Comments)    Unknown reaction  . Codeine Rash  . Hydrocodone Rash    IV Location/Drains/Wounds Patient Lines/Drains/Airways Status   Active Line/Drains/Airways    Name:  Placement date:   Placement time:   Site:   Days:   Peripheral IV 12/26/17 Left Forearm   12/26/17    1004    Forearm   less than 1   Peripheral IV 12/26/17 Right Forearm   12/26/17    1058    Forearm   less than 1          Labs/Imaging Results for orders placed or performed during the hospital encounter of 12/26/17 (from the past 48 hour(s))  Culture, blood (Routine x 2)     Status: None (Preliminary result)   Collection Time: 12/26/17 10:15 AM  Result Value Ref Range   Specimen Description      BLOOD BLOOD RIGHT FOREARM Performed at [facility], [place] [address], [place], [place] [address]    Special Requests      BOTTLES DRAWN AEROBIC AND ANAEROBIC  Blood Culture adequate volume Performed at [facility] [address] [place], [place] [address]    Culture PENDING    Report Status PENDING   Urinalysis, Routine w reflex microscopic     Status: Abnormal   Collection Time: 12/26/17 10:15 AM  Result Value Ref Range   Color, Urine YELLOW YELLOW   APPearance CLEAR CLEAR   Specific Gravity, Urine 1.017 1.005 - 1.030   pH 7.0 5.0 - 8.0   Glucose, UA NEGATIVE NEGATIVE mg/dL   Hgb urine dipstick NEGATIVE NEGATIVE   Bilirubin Urine NEGATIVE NEGATIVE   Ketones, ur NEGATIVE NEGATIVE mg/dL   Protein, ur NEGATIVE NEGATIVE mg/dL   Nitrite NEGATIVE NEGATIVE   Leukocytes, UA TRACE (A) NEGATIVE   RBC / HPF 0-5 0 - 5 RBC/hpf   WBC, UA 21-50 0 - 5 WBC/hpf   Bacteria, UA NONE SEEN NONE SEEN   Mucus PRESENT    Budding Yeast PRESENT     Comment: Performed at [facility], [place] [address], [place], [place] [address]  Comprehensive metabolic panel     Status: Abnormal   Collection Time: 12/26/17 10:18 AM  Result Value Ref Range   Sodium 141 135 - 145 mmol/L   Potassium 4.9 3.5 - 5.1 mmol/L   Chloride 106 98 - 111 mmol/L   CO2 29 22 - 32 mmol/L   Glucose, Bld 125 (H) 70 - 99 mg/dL   BUN 28 (H) 8 - 23 mg/dL   Creatinine, Ser 1.00 0.44 - 1.00 mg/dL   Calcium 9.1 8.9 - 10.3 mg/dL   Total Protein 6.6 6.5 - 8.1 g/dL   Albumin 3.1 (L) 3.5 - 5.0 g/dL   AST 24 15 - 41 U/L   ALT 24 0 - 44 U/L   Alkaline Phosphatase 83 38 - 126 U/L   Total Bilirubin 0.5 0.3 - 1.2 mg/dL   GFR calc non Af Amer 46 (L) >60 mL/min   GFR calc Af Amer 54 (L) >60 mL/min    Comment: (NOTE) The eGFR has been calculated using the CKD EPI equation. This calculation has not been validated in all clinical situations. eGFR's persistently <60 mL/min signify possible Chronic Kidney Disease.    Anion gap 6 5 - 15    Comment: Performed at [name], [place] [address] [place], [place] [address]  CBC with Differential      Status: Abnormal   Collection Time: 12/26/17 10:18 AM  Result Value Ref Range   WBC 25.7 (H) 4.0 - 10.5 K/uL   RBC 3.86 (L) 3.87 - 5.11 MIL/uL  Hemoglobin 11.7 (L) 12.0 - 15.0 g/dL   HCT 35.1 (L) 36.0 - 46.0 %   MCV 90.9 78.0 - 100.0 fL   MCH 30.3 26.0 - 34.0 pg   MCHC 33.3 30.0 - 36.0 g/dL   RDW 14.1 11.5 - 15.5 %   Platelets 197 150 - 400 K/uL   Neutrophils Relative % 92 %   Lymphocytes Relative 5 %   Monocytes Relative 3 %   Eosinophils Relative 0 %   Basophils Relative 0 %   Neutro Abs 23.6 (H) 1.7 - 7.7 K/uL   Lymphs Abs 1.3 0.7 - 4.0 K/uL   Monocytes Absolute 0.8 0.1 - 1.0 K/uL   Eosinophils Absolute 0.0 0.0 - 0.7 K/uL   Basophils Absolute 0.0 0.0 - 0.1 K/uL   RBC Morphology POLYCHROMASIA PRESENT    WBC Morphology INCREASED BANDS (>20% BANDS)     Comment: MILD LEFT SHIFT (1-5% METAS, OCC MYELO, OCC BANDS) Performed at [facility], [place] [address] [place], [place] [address]   Protime-INR     Status: None   Collection Time: 12/26/17 10:18 AM  Result Value Ref Range   Prothrombin Time 11.9 11.4 - 15.2 seconds   INR 0.89     Comment: Performed at [facility], [place] [address], [place], [place] [address]  Culture, blood (Routine x 2)     Status: None (Preliminary result)   Collection Time: 12/26/17 10:18 AM  Result Value Ref Range   Specimen Description      BLOOD RIGHT ANTECUBITAL Performed at [facility] [address], [place], [place] [address]    Special Requests      BOTTLES DRAWN AEROBIC AND ANAEROBIC Blood Culture results may not be optimal due to an excessive volume of blood received in culture bottles Performed at [facility] [address], [place], [place] [address]    Culture PENDING    Report Status PENDING   I-Stat CG4 Lactic Acid, ED     Status: Abnormal   Collection Time: 12/26/17 10:25 AM  Result Value Ref Range   Lactic Acid, Venous 2.08 (HH) 0.5 - 1.9 mmol/L   Comment NOTIFIED  PHYSICIAN   I-Stat CG4 Lactic Acid, ED     Status: None   Collection Time: 12/26/17  1:00 PM  Result Value Ref Range   Lactic Acid, Venous 1.44 0.5 - 1.9 mmol/L   Dg Chest 2 View  Result Date: 12/26/2017 CLINICAL DATA:  Increasing fevers EXAM: CHEST - 2 VIEW COMPARISON:  11/27/2017 FINDINGS: Cardiac shadows within normal limits. Lungs are hyperinflated without focal infiltrate or sizable effusion. Degenerative change of the thoracic spine is seen. IMPRESSION: No acute abnormality noted. Electronically Signed   By: Inez Catalina M.D.   On: 12/26/2017 10:42    Pending Labs Unresulted Labs (From admission, onward)   Start     Ordered   12/27/17 1694  Basic metabolic panel  Tomorrow morning,   R     12/26/17 1824   12/27/17 0500  CBC  Tomorrow morning,   R     12/26/17 1824   12/27/17 0500  Procalcitonin  Daily,   R     12/26/17 1824   12/27/17 0500  Creatinine, serum  Daily,   R     12/26/17 1813   12/26/17 1825  Procalcitonin - Baseline  STAT,   STAT     12/26/17 1824   12/26/17 1015  Urine culture  STAT,   STAT  12/26/17 1014      Vitals/Pain Today's Vitals   12/26/17 1734 12/26/17 1745 12/26/17 1800 12/26/17 1815  BP: (!) 135/59 (!) 126/51 (!) 138/51 (!) 135/55  Pulse: 70 73 77 80  Resp: _0 Temp:      TempSrc:      SpO2: 96% 93% 91% 95%  Weight:      Height:      PainSc:        Isolation Precautions No active isolations  Medications Medications  cefTRIAXone (ROCEPHIN) 2 g in sodium chloride 0.9 % 100 mL IVPB (0 g Intravenous Stopped 12/26/17 1813)  clonazePAM (KLONOPIN) tablet 0.25 mg (has no administration in time range)  divalproex (DEPAKOTE SPRINKLE) capsule 125 mg (has no administration in time range)  guaifenesin (ROBITUSSIN) 100 MG/5ML syrup 200 mg (has no administration in time range)  levothyroxine (SYNTHROID, LEVOTHROID) tablet 75 mcg (has no administration in time range)  loperamide (IMODIUM) capsule 2 mg (has no administration in time range)   Melatonin TABS 3 mg (has no administration in time range)  multivitamin with minerals tablet 1 tablet (has no administration in time range)  magnesium hydroxide (MILK OF MAGNESIA) suspension 30 mL (has no administration in time range)  TRIPLE ANTIBIOTIC [phone] OINT 1 application (has no administration in time range)  NUTRITIONAL DRINK LIQD 1 Bottle (has no administration in time range)  risperiDONE (RISPERDAL) tablet 0.5 mg (has no administration in time range)  rivastigmine (EXELON) capsule 1.5 mg (has no administration in time range)  senna (SENOKOT) tablet 17.2 mg (has no administration in time range)  traZODone (DESYREL) tablet 50 mg (has no administration in time range)  enoxaparin (LOVENOX) injection 30 mg (has no administration in time range)  0.9 %  sodium chloride infusion (has no administration in time range)  acetaminophen (TYLENOL) tablet 650 mg (has no administration in time range)    Or  acetaminophen (TYLENOL) suppository 650 mg (has no administration in time range)  ibuprofen (ADVIL,MOTRIN) tablet 400 mg (has no administration in time range)  ondansetron (ZOFRAN) tablet 4 mg (has no administration in time range)    Or  ondansetron (ZOFRAN) injection 4 mg (has no administration in time range)  vancomycin (VANCOCIN) 1,500 mg in sodium chloride 0.9 % 500 mL IVPB (has no administration in time range)  vancomycin (VANCOCIN) IVPB 1000 mg/200 mL premix (has no administration in time range)  sodium chloride 0.9 % bolus 1,000 mL (0 mLs Intravenous Stopped 12/26/17 1255)    Mobility walks

## 2017-12-26 NOTE — Progress Notes (Signed)
Pharmacy Antibiotic Note  [name] is a 84 y.o. female admitted on 12/26/2017 with Fever unknown origin.  Pharmacy has been consulted for vancomycin dosing. Patient presents from SNF with mental status changes and fever.  History of recurrent UTIs.  Patient also receiving ceftriaxone.  Recent urinary tract infection with aerococcus - this is typically susceptible to amoxicillin, ceftriaxone, PCN.     Today, 12/26/2017  Renal: SCr WNL  WBC elevated  Plan:  Vancomycin 1500 mg IV x 1 then 1gm IV q36h  Daily SCr due to risk of AKI  Check levels if remains on vancomycin > 4 days  De-escalate as appropriate  Height: 5\' 6"  (167.6 cm) Weight: 190 lb (86.2 kg) IBW/kg (Calculated) : 59.3  Temp (24hrs), Avg:99.5 F (37.5 C), Min:99.3 F (37.4 C), Max:99.6 F (37.6 C)  Recent Labs  Lab 12/26/17 1018 12/26/17 1025 12/26/17 1300  WBC 25.7*  --   --   CREATININE 1.00  --   --   LATICACIDVEN  --  2.08* 1.44    Estimated Creatinine Clearance: 37.2 mL/min (by C-G formula based on SCr of 1 mg/dL).    Allergies  Allergen Reactions  . Morphine And Related Other (See Comments)    Unknown reaction  . Codeine Rash  . Hydrocodone Rash    Antimicrobials this admission: 7/27 ceftriaxone >> 7/27 vancomycin >>  Dose adjustments this admission:  Microbiology results: 7/27 BCx:  7/27 UCx:    6/28 6/28 Ucx: >100k aerococcus urinae  Thank you for allowing pharmacy to be a part of this patient's care.  Juliette Alcideustin Alfio Loescher, PharmD, BCPS.   Pager: [phone] 12/26/2017 5:54 PM

## 2017-12-26 NOTE — ED Notes (Signed)
Sitter at bedside, pt resting with no signs of distress.

## 2017-12-26 NOTE — ED Notes (Signed)
Sitter is at bedside and Soft Mittens have been placed on patient.

## 2017-12-26 NOTE — ED Notes (Signed)
Patient transported to X-ray 

## 2017-12-26 NOTE — ED Provider Notes (Signed)
[facility]-EMERGENCY DEPT Provider Note   CSN: [id number] Arrival date & time: 12/26/17  0957     History   Chief Complaint Chief Complaint  Patient presents with  . Altered Mental Status  . Fever    HPI [name] is a 84 y.o. female.  HPI   She presents for evaluation of altered mental status associated with fever.  She is unable to give any history.  She was treated with Tylenol just prior to EMS arrival who transferred her here.  Level 5 caveat-dementia  Past Medical History:  Diagnosis Date  . Acute bronchitis 04/10/2013  . Anemia   . Angina   . Anxiety   . Arthritis   . Arthritis 10/02/2012  . Asthma   . Chicken pox   . Chronic kidney disease   . Colon polyps   . Dehydration 08/31/2013  . Dementia   . Depression   . Dysrhythmia   . Headache(784.0)   . Heart murmur   . Hypertension   . Hypothyroidism   . Left leg pain 10/02/2012  . Pneumonia   . Recurrent upper respiratory infection (URI)   . Shortness of breath   . UTI (urinary tract infection) 12/30/2012  . Valvular heart disease 09/04/2012    Patient Active Problem List   Diagnosis Date Noted  . Abdominal tenderness 11/28/2017  . Intertriginous candidiasis   . Depression 11/27/2017  . Rash 11/27/2017  . Tibial fracture 09/16/2016  . Left fibular fracture 09/16/2016  . Leukocytosis 09/16/2016  . Pressure ulcer 09/20/2015  . Displaced fracture of right femoral neck (HCC) 09/19/2015  . Altered mental status   . Confusion 06/26/2015  . Fall   . Aspiration pneumonia (HCC) 01/10/2015  . Acute metabolic encephalopathy 01/08/2015  . UTI (urinary tract infection) 01/08/2015  . 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 Recurrent falls 11/27/2014  . FTT (failure to thrive) in adult 10/26/2014  . Dehydration 10/26/2014  . Abnormal urinalysis 10/26/2014  . Normocytic anemia 10/26/2014  . Lump of skin of left upper extremity 09/07/2014  . Rash and nonspecific skin  eruption 05/08/2014  . Syncope and collapse 11/30/2013  . Low back pain, episodic 11/20/2013  . Chronic kidney disease   . Insomnia 08/31/2013  . Arthritis 10/02/2012  . Valvular heart disease 09/04/2012  . Restless leg syndrome 06/06/2012  . Trochanteric bursitis 08/02/2011  . Dementia 08/02/2011  . Leg pain 03/28/2011  . Hypertension 09/22/2010  . Hypothyroidism 09/22/2010    Past Surgical History:  Procedure Laterality Date  . ABDOMINAL HYSTERECTOMY    . APPENDECTOMY    . BACK SURGERY    . CHOLECYSTECTOMY    . COLONOSCOPY W/ POLYPECTOMY    . EYE SURGERY     cataract removed and eye lids lifted  . feet surgery    . FRACTURE SURGERY     bilateral arms  . HAND SURGERY    . HIP ARTHROPLASTY Right 09/20/2015   Procedure: ARTHROPLASTY RIGHT  BIPOLAR ANTERIOR HIP (HEMIARTHROPLASTY);  Surgeon: Samson Frederic, MD;  Location: [facility] ORS;  Service: Orthopedics;  Laterality: Right;  . kidney stones    . SHOULDER ARTHROSCOPY    . TONSILLECTOMY    . TONSILLECTOMY    . TUBAL LIGATION       OB History   None      Home Medications    Prior to Admission medications   Medication Sig Start Date End Date Taking? Authorizing Provider  acetaminophen (TYLENOL) 500 MG  tablet Take 500 mg by mouth every 4 (four) hours as needed for mild pain, moderate pain, fever or headache.    Yes [provider]  aspirin 81 MG chewable tablet Chew 81 mg daily by mouth.    Yes [provider]  clonazePAM (KLONOPIN) 0.5 MG tablet Take 0.5 tablets (0.25 mg total) by mouth 2 (two) times daily. 12/01/17  Yes Zannie Cove, MD  divalproex (DEPAKOTE SPRINKLE) 125 MG capsule Take 1 capsule (125 mg total) by mouth 2 (two) times daily. Patient taking differently: Take 125 mg by mouth 3 (three) times daily.  12/01/17  Yes Zannie Cove, MD  guaifenesin (ROBITUSSIN) 100 MG/5ML syrup Take 200 mg by mouth every 6 (six) hours as needed for cough.    Yes [provider]  levothyroxine  (SYNTHROID, LEVOTHROID) 75 MCG tablet Take 1 tablet (75 mcg total) by mouth daily before breakfast. 12/01/17  Yes Zannie Cove, MD  loperamide (IMODIUM) 2 MG capsule Take 2 mg by mouth as needed for diarrhea or loose stools.   Yes [provider]  magnesium hydroxide (MILK OF MAGNESIA) 400 MG/5ML suspension Take 30 mLs by mouth daily as needed for mild constipation.   Yes [provider]  Melatonin 3 MG TABS Take 3 mg by mouth at bedtime.   Yes [provider]  Multiple Vitamins-Minerals (MULTIVITAMIN WITH MINERALS) tablet Take 1 tablet daily with breakfast by mouth. "Akorn/s antioxidant"   Yes [provider]  Neomycin-Bacitracin-Polymyxin (TRIPLE ANTIBIOTIC) [phone] OINT Apply 1 application 3 (three) times daily as needed topically (skin tears/abrasians).    Yes [provider]  Nutritional Supplements (NUTRITIONAL DRINK) LIQD Take 1 Bottle by mouth 3 (three) times daily with meals. *Mighty Shakes*   Yes [provider]  risperiDONE (RISPERDAL) 0.5 MG tablet Take 0.5 mg daily as needed by mouth (severe agitation/anxiety).  12/16/16  Yes [provider]  rivastigmine (EXELON) 1.5 MG capsule Take 1.5 mg by mouth 2 (two) times daily.   Yes [provider]  senna (SENOKOT) 8.6 MG TABS tablet Take 17.2 mg by mouth 2 (two) times daily.    Yes [provider]  traZODone (DESYREL) 50 MG tablet Take 1 tablet (50 mg total) by mouth at bedtime as needed for sleep. 12/01/17  Yes Zannie Cove, MD  amoxicillin (AMOXIL) 500 MG tablet Take 1 tablet (500 mg total) by mouth 2 (two) times daily. For 2days Patient not taking: Reported on 12/26/2017 12/01/17   Zannie Cove, MD    Family History Family History  Problem Relation Age of Onset  . Heart disease Mother   . Heart disease Father   . Heart disease Other   . Birth defects Other     Social History Social History   Tobacco Use  . Smoking status: Former Smoker     Packs/day: 0.20    Years: 1.00    Pack years: 0.20    Types: Cigarettes    Last attempt to quit: 06/02/1941    Years since quitting: 76.6  . Smokeless tobacco: Never Used  Substance Use Topics  . Alcohol use: No  . Drug use: No     Allergies   Morphine and related; Codeine; and Hydrocodone   Review of Systems Review of Systems  All other systems reviewed and are negative.    Physical Exam Updated Vital Signs BP (!) 121/49   Pulse 74   Temp (S) 99.6 F (37.6 C) (Rectal)   Resp 14   Ht 5\' 6"  (1.676 m)  Wt 86.2 kg (190 lb)   SpO2 95%   BMI 30.67 kg/m   Physical Exam  Constitutional: She appears well-developed. She appears distressed (She appears uncomfortable).  Elderly, overweight.  She appears ill  HENT:  Head: Normocephalic and atraumatic.  Eyes: Pupils are equal, round, and reactive to light. Conjunctivae and EOM are normal.  Neck: Normal range of motion and phonation normal. Neck supple.  Cardiovascular: Normal rate and regular rhythm.  Pulmonary/Chest: Effort normal and breath sounds normal. No stridor. No respiratory distress. She has no wheezes. She exhibits no tenderness.  Tachypnea  Abdominal: Soft. She exhibits no distension. There is no tenderness. There is no guarding.  Musculoskeletal: Normal range of motion. She exhibits no edema, tenderness or deformity.  Neurological: She is alert. She exhibits normal muscle tone.  Skin: Skin is warm and dry. No erythema. No pallor.  No significant skin breakdown appreciated  Psychiatric:  She is lethargic  Nursing note and vitals reviewed.    ED Treatments / Results  Labs (all labs ordered are listed, but only abnormal results are displayed) Labs Reviewed  COMPREHENSIVE METABOLIC PANEL - Abnormal; Notable for the following components:      Result Value   Glucose, Bld 125 (*)    BUN 28 (*)    Albumin 3.1 (*)    GFR calc non Af Amer 46 (*)    GFR calc Af Amer 54 (*)    All other components within normal  limits  CBC WITH DIFFERENTIAL/PLATELET - Abnormal; Notable for the following components:   WBC 25.7 (*)    RBC 3.86 (*)    Hemoglobin 11.7 (*)    HCT 35.1 (*)    Neutro Abs 23.6 (*)    All other components within normal limits  URINALYSIS, ROUTINE W REFLEX MICROSCOPIC - Abnormal; Notable for the following components:   Leukocytes, UA TRACE (*)    All other components within normal limits  I-STAT CG4 LACTIC ACID, ED - Abnormal; Notable for the following components:   Lactic Acid, Venous 2.08 (*)    All other components within normal limits  CULTURE, BLOOD (ROUTINE X 2)  CULTURE, BLOOD (ROUTINE X 2)  URINE CULTURE  PROTIME-INR  I-STAT CG4 LACTIC ACID, ED    EKG None  Radiology Dg Chest 2 View  Result Date: 12/26/2017 CLINICAL DATA:  Increasing fevers EXAM: CHEST - 2 VIEW COMPARISON:  11/27/2017 FINDINGS: Cardiac shadows within normal limits. Lungs are hyperinflated without focal infiltrate or sizable effusion. Degenerative change of the thoracic spine is seen. IMPRESSION: No acute abnormality noted. Electronically Signed   By: Alcide Clever M.D.   On: 12/26/2017 10:42    Procedures Procedures (including critical care time)  Medications Ordered in ED Medications  cefTRIAXone (ROCEPHIN) 2 g in sodium chloride 0.9 % 100 mL IVPB (2 g Intravenous New Bag/Given 12/26/17 1626)  sodium chloride 0.9 % bolus 1,000 mL (0 mLs Intravenous Stopped 12/26/17 1255)     Initial Impression / Assessment and Plan / ED Course  I have reviewed the triage vital signs and the nursing notes.  Pertinent labs & imaging results that were available during my care of the patient were reviewed by me and considered in my medical decision making (see chart for details).  Clinical Course as of Dec 26 1633  Sat Dec 26, 2017  1633 Improved from earlier mild elevation.  I-Stat CG4 Lactic Acid, ED [EW]  1633 Normal except elevated white count and low hemoglobin  CBC with Differential(!) [EW]  1633 Normal  except elevated glucose and BUN, and low albumin  Comprehensive metabolic panel(!) [EW]  1634 Abnormal, increased WBC  Urinalysis, Routine w reflex microscopic(!) [EW]  1634 No infiltrate, images reviewed by me  DG Chest 2 View [EW]    Clinical Course User Index [EW] Mancel BaleWentz, Ponciano Shealy, MD     Patient Vitals for the past 24 hrs:  BP Temp Temp src Pulse Resp SpO2 Height Weight  12/26/17 1615 (!) 121/49 - - 74 14 95 % - -  12/26/17 1600 (!) 112/50 - - 74 13 93 % - -  12/26/17 1530 (!) 112/41 - - 71 14 95 % - -  12/26/17 1517 (!) 112/45 - - 78 - 97 % - -  12/26/17 1515 (!) 112/45 - - 72 12 93 % - -  12/26/17 1445 (!) 103/43 - - 74 13 94 % - -  12/26/17 1436 - - - - - 96 % - -  12/26/17 1432 - - - - - (!) 89 % - -  12/26/17 1430 - - - - - 90 % - -  12/26/17 1425 - - - - - (!) 89 % - -  12/26/17 1418 - - - - - (!) 89 % - -  12/26/17 1415 (!) 124/52 - - 80 11 90 % - -  12/26/17 1400 (!) 102/48 - - 79 15 90 % - -  12/26/17 1355 - - - - - 90 % - -  12/26/17 1350 - - - - - (!) 89 % - -  12/26/17 1345 (!) 107/39 - - 75 12 (!) 87 % - -  12/26/17 1343 - - - - - (!) 89 % - -  12/26/17 1339 - - - - - (!) 88 % - -  12/26/17 1332 (!) 115/47 - - 86 13 92 % - -  12/26/17 1330 - - - - - 93 % - -  12/26/17 1325 - - - - - 91 % - -  12/26/17 1320 105/62 - - 88 (!) 28 90 % - -  12/26/17 1300 113/60 - - 96 17 95 % - -  12/26/17 1252 (!) 114/59 - - 86 16 95 % - -  12/26/17 1251 106/60 - - 84 16 95 % - -  12/26/17 1246 (!) 118/59 - - 90 16 95 % - -  12/26/17 1215 (!) 116/51 - - 89 16 94 % - -  12/26/17 1200 (!) 115/42 - - 82 15 93 % - -  12/26/17 1145 (!) 103/48 - - 80 14 92 % - -  12/26/17 1134 (!) 112/51 - - 85 17 94 % - -  12/26/17 1133 - 99.6 F (37.6 C) Rectal - - - - -  12/26/17 1053 (!) 119/53 - - 90 (!) 23 92 % - -  12/26/17 1013 - - - - - - 5\' 6"  (1.676 m) 86.2 kg (190 lb)  12/26/17 1005 (!) 156/59 99.3 F (37.4 C) Oral 97 20 94 % - -  12/26/17 1004 - - - - - 91 % - -    4:30 PM  Reevaluation with update and discussion. After initial assessment and treatment, an updated evaluation reveals at this point the patient is more alert and conversant however quite confused.  Patient has been monitored closely here and has hypoxia on room air, 88 to 89%.  She does not have any other clear respiratory symptoms.  She has been treated with Rocephin for possible UTI.  Mancel Bale   Medical Decision Making: Fever with altered mental status, and likely dehydration.  Incidental hypoxia, cause not clear.  Urinalysis is mildly abnormal but not clearly cause for her altered mental status.  There are no other signs of infection, and the patient improved after she was given intravenous fluids.  CRITICAL CARE-yes Performed by: Mancel Bale   Nursing Notes Reviewed/ Care Coordinated Applicable Imaging Reviewed Interpretation of Laboratory Data incorporated into ED treatment   4:35 PM-Consult complete with hospitalist. Patient case explained and discussed. He agrees to admit patient for further evaluation and treatment. Call ended at 5:02 PM  Plan: Admit    Final Clinical Impressions(s) / ED Diagnoses   Final diagnoses:  Urinary tract infection without hematuria, site unspecified  Altered mental status, unspecified altered mental status type    ED Discharge Orders    None       Mancel Bale, MD 12/26/17 1704

## 2017-12-26 NOTE — ED Notes (Signed)
Pt baseline is walking and talking, Pt does have Dementia however can carry on conversation. Pt is NOT at baseline currently.

## 2017-12-26 NOTE — ED Notes (Signed)
EDP Wentz notified of 2.08 POC Lactic.

## 2017-12-26 NOTE — ED Notes (Signed)
Informed ED Provider of low BP.

## 2017-12-26 NOTE — ED Notes (Signed)
Report given to [place] BingKim RN on 5E, for Room 1517.

## 2017-12-27 ENCOUNTER — Observation Stay (HOSPITAL_COMMUNITY): Payer: Medicare HMO

## 2017-12-27 DIAGNOSIS — R410 Disorientation, unspecified: Secondary | ICD-10-CM | POA: Diagnosis not present

## 2017-12-27 DIAGNOSIS — J9811 Atelectasis: Secondary | ICD-10-CM | POA: Diagnosis not present

## 2017-12-27 LAB — CBC
HCT: 32.5 % — ABNORMAL LOW (ref 36.0–46.0)
Hemoglobin: 10.4 g/dL — ABNORMAL LOW (ref 12.0–15.0)
MCH: 30.1 pg (ref 26.0–34.0)
MCHC: 32 g/dL (ref 30.0–36.0)
MCV: 94.2 fL (ref 78.0–100.0)
Platelets: 165 K/uL (ref 150–400)
RBC: 3.45 MIL/uL — ABNORMAL LOW (ref 3.87–5.11)
RDW: 14.6 % (ref 11.5–15.5)
WBC: 18.7 10*3/uL — ABNORMAL HIGH (ref 4.0–10.5)

## 2017-12-27 LAB — BASIC METABOLIC PANEL
Anion gap: 10 (ref 5–15)
BUN: 19 mg/dL (ref 8–23)
Calcium: 8.9 mg/dL (ref 8.9–10.3)
Chloride: 109 mmol/L (ref 98–111)
Potassium: 4.4 mmol/L (ref 3.5–5.1)
Sodium: 143 mmol/L (ref 135–145)

## 2017-12-27 LAB — BASIC METABOLIC PANEL WITH GFR
CO2: 24 mmol/L (ref 22–32)
Creatinine, Ser: 0.96 mg/dL (ref 0.44–1.00)
GFR calc Af Amer: 56 mL/min — ABNORMAL LOW (ref >60)
GFR calc non Af Amer: 49 mL/min — ABNORMAL LOW (ref >60)
Glucose, Bld: 87 mg/dL (ref 70–99)

## 2017-12-27 LAB — URINE CULTURE: CULTURE: NO GROWTH

## 2017-12-27 LAB — PROCALCITONIN: Procalcitonin: <0.1 ng/mL

## 2017-12-27 MED ORDER — CLONAZEPAM 0.125 MG PO TBDP
0.2500 mg | ORAL_TABLET | Freq: Two times a day (BID) | ORAL | Status: DC | PRN
  Administered 2017-12-28 – 2017-12-31 (×4): 0.25 mg via ORAL
  Filled 2017-12-27 (×4): qty 2

## 2017-12-27 MED ORDER — VANCOMYCIN HCL IN DEXTROSE 1-5 GM/200ML-% IV SOLN
1000.0000 mg | INTRAVENOUS | Status: DC
  Administered 2017-12-28: 1000 mg via INTRAVENOUS
  Filled 2017-12-27: qty 200

## 2017-12-27 MED ORDER — SODIUM CHLORIDE 0.9 % IV SOLN
2.0000 g | INTRAVENOUS | Status: DC
  Administered 2017-12-27 – 2017-12-28 (×2): 2 g via INTRAVENOUS
  Filled 2017-12-27: qty 20
  Filled 2017-12-27: qty 2

## 2017-12-27 NOTE — Progress Notes (Signed)
PROGRESS NOTE    Deborah CorpusStella P Horton  ZOX:[id number] DOB: 13-Jun-1920 DOA: 12/26/2017 PCP: Ron ParkerBowen, [name], MD    Brief Narrative:   84 year old with past medical history relevant for recurrent UTIs, hypothyroidism, dementia with behavioral/mood problems, depression, who was recently admitted and discharged with Aerococcus urinate UTI on 12/01/2017 who presents again with altered mental status and a fever to 100.6 rectally and elevated WBC.      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  Procalcitonin is quite low.  Again she has no localizing signs or symptoms. -Continue IV ceftriaxone and vancomycin ordered 12/26/2017 -blood cultures ordered on 12/26/2017 with no growth to date -Procalcitonin quite low, repeat pending -Repeat chest x-ray shows possible opacity concerning for pneumonia, due to low procalcitonin suspect that this is likely viral in nature however we will continue IV antibiotics until blood cultures are negative for 48 hours and transition to oral doxycycline.  #) Dementia with behavioral problems: -Continue revisitigmine 1.5 mg twice daily -Continue risperidone 0.5 mg as needed -Continue clonazepam 0.25 mg twice daily -Continue divalproex 125 mg 3 times daily  #) Hypothyroidism: -Continue levothyroxine 75 mcg daily  #) Aspirin use: There is unclear why this patient is on aspirin.  It is not useful for primary prophylaxis at this age she does not have any medical problems on her list that would necessarily need aspirin any longer. -Consider discharging, will not restart here  #) Loose stool/chronic diarrhea: - Continue PRN loperamide  Fluids:  Tolerating p.o. Elect lites: Monitor and supplement Nutrition: Regular diet  Prophylaxis: Enoxaparin  Disposition: Pending 48 hours of negative blood cultures and further downtrending white count  DO NOT RESUSCITATE     Consultants:   None  Procedures:   None  Antimicrobials:   IV ceftriaxone and vancomycin started 12/26/2017   Subjective: Patient is doing fairly well and is pleasantly demented.  She denies any pain anywhere.  She denies any nausea, vomiting, diarrhea.  Objective: Vitals:   12/26/17 1853 12/26/17 2024 12/27/17 0524 12/27/17 0953  BP:  107/89 (!) 151/66 (!) 134/53  Pulse: 81 75 70 62  Resp:  16 12 16   Temp:  98.4 F (36.9 C) 98.4 F (36.9 C)   TempSrc:  Oral Oral   SpO2: 97% 99% 98% 99%  Weight:      Height:        Intake/Output Summary (Last 24 hours) at 12/27/2017 1023 Last data filed at 12/27/2017 0534 Gross per 24 hour  Intake 2030 ml  Output 400 ml  Net 1630 ml   Filed Weights   12/26/17 1013 12/26/17 1849  Weight: 86.2 kg (190 lb) 81.7 kg (180 lb 1.9 oz)    Examination:  General exam: Appears calm and comfortable  Respiratory system: No increased work of breathing, diminished lung sounds at bases, no wheezes, rhonchi, rales Cardiovascular system: Distant heart sounds, regular rate and rhythm, no murmurs Gastrointestinal system: Soft, mildly tender to deep palpation around the peri-umbilicus area, plus bowel sounds, no rebound or guarding Central nervous system: Alert but not oriented, moving all extremities, no focal neurological deficits Extremities: Trace lower extremity edema Skin: No rashes over visible skin Psychiatry: Unable to  assess due to underlying dementia    Data Reviewed: I have personally reviewed following labs and imaging studies  CBC: Recent Labs  Lab 12/26/17 1018 12/27/17 0455  WBC 25.7* 18.7*  NEUTROABS 23.6*  --   HGB 11.7* 10.4*  HCT 35.1* 32.5*  MCV 90.9 94.2  PLT 197 165   Basic Metabolic  Panel: Recent Labs  Lab 12/26/17 1018 12/27/17 0455  NA 141 143  K 4.9 4.4  CL 106 109  CO2 29 24  GLUCOSE 125* 87  BUN 28* 19  CREATININE 1.00 0.96  CALCIUM 9.1 8.9   GFR: Estimated Creatinine Clearance: 37.8 mL/min (by C-G formula based on SCr of 0.96 mg/dL). Liver Function Tests: Recent Labs  Lab 12/26/17 1018  AST 24  ALT 24  ALKPHOS 83  BILITOT 0.5  PROT 6.6  ALBUMIN 3.1*   No results for input(s): LIPASE, AMYLASE in the last 168 hours. No results for input(s): AMMONIA in the last 168 hours. Coagulation Profile: Recent Labs  Lab 12/26/17 1018  INR 0.89   Cardiac Enzymes: No results for input(s): CKTOTAL, CKMB, CKMBINDEX, TROPONINI in the last 168 hours. BNP (last 3 results) No results for input(s): PROBNP in the last 8760 hours. HbA1C: No results for input(s): HGBA1C in the last 72 hours. CBG: Recent Labs  Lab 12/26/17 1732  GLUCAP 109*   Lipid Profile: No results for input(s): CHOL, HDL, LDLCALC, TRIG, CHOLHDL, LDLDIRECT in the last 72 hours. Thyroid Function Tests: No results for input(s): TSH, T4TOTAL, FREET4, T3FREE, THYROIDAB in the last 72 hours. Anemia Panel: No results for input(s): VITAMINB12, FOLATE, FERRITIN, TIBC, IRON, RETICCTPCT in the last 72 hours. Sepsis Labs: Recent Labs  Lab 12/26/17 1025 12/26/17 1300 12/26/17 1947 12/27/17 0455  PROCALCITON  --   --  0.10 <0.10  LATICACIDVEN 2.08* 1.44  --   --     Recent Results (from the past 240 hour(s))  Culture, blood (Routine x 2)     Status: None (Preliminary result)   Collection Time: 12/26/17 10:15 AM  Result Value Ref Range Status   Specimen Description   Final    BLOOD BLOOD RIGHT FOREARM Performed at [facility], [address] [place], [place] [address]    Special Requests   Final    BOTTLES DRAWN AEROBIC AND ANAEROBIC Blood Culture adequate volume Performed at [facility], [address] [place], [place] [address]    Culture PENDING   Incomplete   Report Status PENDING  Incomplete  Culture, blood (Routine x 2)     Status: None (Preliminary result)   Collection Time: 12/26/17 10:18 AM  Result Value Ref Range Status   Specimen Description   Final    BLOOD RIGHT ANTECUBITAL Performed at [facility], [address] [place], [place] [address]    Special Requests   Final    BOTTLES DRAWN AEROBIC AND ANAEROBIC Blood Culture results may not be optimal due to an excessive volume of blood received in culture bottles Performed at [facility], [address], [place], [place] [address]    Culture PENDING  Incomplete   Report Status PENDING  Incomplete  MRSA PCR Screening     Status: None   Collection Time: 12/26/17  8:11 PM  Result Value Ref Range Status   MRSA by PCR NEGATIVE NEGATIVE Final    Comment:        The GeneXpert MRSA Assay (FDA approved for NASAL specimens only), is one component  of a comprehensive MRSA colonization surveillance program. It is not intended to diagnose MRSA infection nor to guide or monitor treatment for MRSA infections. Performed at [facility], [address], [place], [place] [address]          Radiology Studies: Dg Chest 2 View  Result Date: 12/26/2017 CLINICAL DATA:  Increasing fevers EXAM: CHEST - 2 VIEW COMPARISON:  11/27/2017 FINDINGS: Cardiac shadows within normal limits. Lungs are hyperinflated without focal infiltrate or sizable effusion. Degenerative change of the thoracic spine is seen. IMPRESSION: No acute abnormality noted. Electronically Signed   By: Alcide Clever M.D.   On: 12/26/2017 10:42        Scheduled Meds: . clonazepam  0.25 mg Oral BID  . divalproex  125 mg Oral TID  . enoxaparin (LOVENOX) injection  40 mg Subcutaneous Q24H  . feeding supplement (ENSURE ENLIVE)  1 Bottle Oral TID WC  . haloperidol lactate  2 mg Intravenous Once  . levothyroxine  75 mcg Oral QAC breakfast  . mouth rinse  15 mL Mouth Rinse  BID  . Melatonin  3 mg Oral QHS  . multivitamin with minerals  1 tablet Oral Daily  . rivastigmine  1.5 mg Oral BID  . senna  17.2 mg Oral BID   Continuous Infusions: . sodium chloride 50 mL/hr at 12/26/17 1947     LOS: 0 days    Time spent: 35    Delaine Lame, MD [facility]  If 7PM-7AM, please contact night-coverage [email or web address] Password [facility] 12/27/2017, 10:23 AM

## 2017-12-27 NOTE — Progress Notes (Signed)
Patient noted to be coughing after ingesting small sips of milk and eggs this morning. Will continue to monitor.

## 2017-12-27 NOTE — Progress Notes (Signed)
Received telephone call from patient's son, Gordy ClementCharles Howard Blake, Medical Power of Attorney for patient, giving staff on 5 E permission to discuss his mother's care [facility]. Telephone permission given to this Clinical research associatewriter as well as to Orlena SheldonEmily Caudle, RN, CN of 5 E.

## 2017-12-28 DIAGNOSIS — R410 Disorientation, unspecified: Secondary | ICD-10-CM | POA: Diagnosis not present

## 2017-12-28 LAB — CBC
HCT: 29.9 % — ABNORMAL LOW (ref 36.0–46.0)
Hemoglobin: 10 g/dL — ABNORMAL LOW (ref 12.0–15.0)
MCH: 30.8 pg (ref 26.0–34.0)
MCHC: 33.4 g/dL (ref 30.0–36.0)
MCV: 92 fL (ref 78.0–100.0)
Platelets: 161 K/uL (ref 150–400)
RBC: 3.25 MIL/uL — ABNORMAL LOW (ref 3.87–5.11)
RDW: 14.3 % (ref 11.5–15.5)
WBC: 10.2 10*3/uL (ref 4.0–10.5)

## 2017-12-28 LAB — PROCALCITONIN: Procalcitonin: <0.1 ng/mL

## 2017-12-28 NOTE — Progress Notes (Signed)
Pt awake and tearful at this time, stating that she isn't certain how she got here. Wants nurses to take her back home now. Encouraged to attempt to get some rest and efforts would be made to get her home in the morning. No acute distress note.

## 2017-12-28 NOTE — Progress Notes (Signed)
Pt noted to cough after taking sips of liquids and has a cough / wheezing when rolling side to side in bed when laying flat during personal care , will cont to monitor swallowing & lung sounds

## 2017-12-28 NOTE — Progress Notes (Signed)
PROGRESS NOTE    KENDALLYN LIPPOLD  ZOX:[id number] DOB: 01-15-1921 DOA: 12/26/2017 PCP: Ron Parker, MD    Brief Narrative:   84 year old with past medical history relevant for recurrent UTIs, hypothyroidism, dementia with behavioral/mood problems, depression, who was recently admitted and discharged with Aerococcus urinate UTI on 12/01/2017 who presents again with altered mental status and a fever to 100.6 rectally and elevated WBC.      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 -Continue IV ceftriaxone and vancomycin ordered 12/26/2017 -blood cultures ordered on 12/26/2017 with no growth to date -Procalcitonin quite low, x2 however patient is responding well to pneumonia  #) Dementia with behavioral problems: -Continue revisitigmine 1.5 mg twice daily -Continue risperidone 0.5 mg as needed -Continue clonazepam 0.25 mg twice daily -Continue divalproex 125 mg 3 times daily  #) Hypothyroidism: -Continue levothyroxine 75 mcg daily  #) Aspirin use: There is unclear why this patient is on aspirin.  It is not useful for primary prophylaxis at this age she does not have any medical problems on her list that would necessarily need aspirin any longer. -Consider discharging, will not restart here  #) Loose stool/chronic diarrhea: - Continue PRN loperamide  Fluids: Tolerating p.o. Elect lites: Monitor and supplement Nutrition: Regular diet  Prophylaxis: Enoxaparin  Disposition: Pending 48 hours of negative blood cultures and further downtrending white count  DO NOT  RESUSCITATE     Consultants:   None  Procedures:   None  Antimicrobials:   IV ceftriaxone and vancomycin started 12/26/2017   Subjective: Patient is doing fairly well and is pleasantly demented.  She is insistent about getting up in bed.  She denies any pain anywhere.  She denies any nausea, vomiting, diarrhea.  Objective: Vitals:   12/27/17 0953 12/27/17 1414 12/27/17 2022 12/28/17 0505  BP: (!) 134/53 (!) 119/57 (!) 141/63 (!) 138/59  Pulse: 62 72 70 74  Resp: 16  18   Temp:  98 F (36.7 C) 98.2 F (36.8 C)   TempSrc:  Oral Oral   SpO2: 99% 95% 93% 93%  Weight:      Height:        Intake/Output Summary (Last 24 hours) at 12/28/2017 1025 Last data filed at 12/28/2017 0900 Gross per 24 hour  Intake 1261.67 ml  Output 2300 ml  Net -1038.33 ml   Filed Weights   12/26/17 1013 12/26/17 1849  Weight: 86.2 kg (190 lb) 81.7 kg (180 lb 1.9 oz)    Examination:  General exam: Appears calm and comfortable  Respiratory system: No increased work of breathing, diminished lung sounds at bases, scattered rhonchi, no wheezes or crackles Cardiovascular system: Distant heart sounds, regular rate and rhythm, no murmurs Gastrointestinal system: Soft, nondistended, nontender, no rebound or guarding, plus bowel sounds Central nervous system: Alert but not oriented, moving all extremities, no focal neurological deficits Extremities: Trace lower extremity edema Skin: No rashes over visible skin Psychiatry: Unable to assess due to underlying dementia    Data Reviewed: I have personally reviewed following labs and imaging studies  CBC: Recent Labs  Lab 12/26/17 1018 12/27/17  0455 12/28/17 0534  WBC 25.7* 18.7* 10.2  NEUTROABS 23.6*  --   --   HGB 11.7* 10.4* 10.0*  HCT 35.1* 32.5* 29.9*  MCV 90.9 94.2 92.0  PLT 197 165 161   Basic Metabolic Panel: Recent Labs  Lab 12/26/17 1018 12/27/17 0455  NA 141 143  K 4.9 4.4  CL 106 109  CO2 29 24  GLUCOSE 125* 87  BUN  28* 19  CREATININE 1.00 0.96  CALCIUM 9.1 8.9   GFR: Estimated Creatinine Clearance: 37.8 mL/min (by C-G formula based on SCr of 0.96 mg/dL). Liver Function Tests: Recent Labs  Lab 12/26/17 1018  AST 24  ALT 24  ALKPHOS 83  BILITOT 0.5  PROT 6.6  ALBUMIN 3.1*   No results for input(s): LIPASE, AMYLASE in the last 168 hours. No results for input(s): AMMONIA in the last 168 hours. Coagulation Profile: Recent Labs  Lab 12/26/17 1018  INR 0.89   Cardiac Enzymes: No results for input(s): CKTOTAL, CKMB, CKMBINDEX, TROPONINI in the last 168 hours. BNP (last 3 results) No results for input(s): PROBNP in the last 8760 hours. HbA1C: No results for input(s): HGBA1C in the last 72 hours. CBG: Recent Labs  Lab 12/26/17 1732  GLUCAP 109*   Lipid Profile: No results for input(s): CHOL, HDL, LDLCALC, TRIG, CHOLHDL, LDLDIRECT in the last 72 hours. Thyroid Function Tests: No results for input(s): TSH, T4TOTAL, FREET4, T3FREE, THYROIDAB in the last 72 hours. Anemia Panel: No results for input(s): VITAMINB12, FOLATE, FERRITIN, TIBC, IRON, RETICCTPCT in the last 72 hours. Sepsis Labs: Recent Labs  Lab 12/26/17 1025 12/26/17 1300 12/26/17 1947 12/27/17 0455 12/28/17 0534  PROCALCITON  --   --  0.10 <0.10 <0.10  LATICACIDVEN 2.08* 1.44  --   --   --     Recent Results (from the past 240 hour(s))  Culture, blood (Routine x 2)     Status: None (Preliminary result)   Collection Time: 12/26/17 10:15 AM  Result Value Ref Range Status   Specimen Description   Final    BLOOD BLOOD RIGHT FOREARM Performed at [facility], [address] [place], [place] [address]    Special Requests   Final    BOTTLES DRAWN AEROBIC AND ANAEROBIC Blood Culture adequate volume Performed at [facility], [address] [place], [place] [address]    Culture   Final    NO GROWTH < 24 HOURS Performed at [facility], [address] [place], [place] [address]     Report Status PENDING  Incomplete  Urine culture     Status: None   Collection Time: 12/26/17 10:15 AM  Result Value Ref Range Status   Specimen Description   Final    URINE, RANDOM Performed at [facility], [address] [place], [place] [address]    Special Requests   Final    NONE Performed at [facility], [address] [place], [place] [address]    Culture   Final    NO GROWTH Performed at [facility], [address] [place], [place] [address]    Report Status 12/27/2017 FINAL  Final  Culture, blood (Routine x 2)     Status: None (Preliminary result)   Collection Time: 12/26/17 10:18 AM  Result Value Ref Range Status   Specimen Description   Final    BLOOD RIGHT ANTECUBITAL Performed at [facility], [address] [place], [place] [address]  Special Requests   Final    BOTTLES DRAWN AEROBIC AND ANAEROBIC Blood Culture results may not be optimal due to an excessive volume of blood received in culture bottles   Culture   Final    NO GROWTH < 24 HOURS Performed at [facility], [address], [place], [place] [address]    Report Status PENDING  Incomplete  MRSA PCR Screening     Status: None   Collection Time: 12/26/17  8:11 PM  Result Value Ref Range Status   MRSA by PCR NEGATIVE NEGATIVE Final    Comment:        The GeneXpert MRSA Assay (FDA approved for NASAL specimens only), is one component of a comprehensive MRSA colonization surveillance program. It is not intended to diagnose MRSA infection nor to guide or monitor treatment for MRSA infections. Performed at [facility], [address] [place], [place] [address]          Radiology Studies: Dg Chest 1 View  Result Date: 12/27/2017 CLINICAL DATA:  Fever EXAM: CHEST  1 VIEW COMPARISON:  12/26/2017 and prior chest radiographs FINDINGS: Equivocal LEFT LOWER lung retrocardiac density noted which may  represent atelectasis or airspace disease. Minimal RIGHT basilar atelectasis/scarring noted. Cardiomegaly identified. No pneumothorax or pleural effusion. No acute bony abnormalities identified. Degenerative changes in the RIGHT shoulder again noted. IMPRESSION: Equivocal LEFT LOWER lung retrocardiac density which may represent atelectasis or possibly airspace disease. Cardiomegaly. Electronically Signed   By: Harmon Pier M.D.   On: 12/27/2017 11:19   Dg Chest 2 View  Result Date: 12/26/2017 CLINICAL DATA:  Increasing fevers EXAM: CHEST - 2 VIEW COMPARISON:  11/27/2017 FINDINGS: Cardiac shadows within normal limits. Lungs are hyperinflated without focal infiltrate or sizable effusion. Degenerative change of the thoracic spine is seen. IMPRESSION: No acute abnormality noted. Electronically Signed   By: Alcide Clever M.D.   On: 12/26/2017 10:42        Scheduled Meds: . divalproex  125 mg Oral TID  . enoxaparin (LOVENOX) injection  40 mg Subcutaneous Q24H  . feeding supplement (ENSURE ENLIVE)  1 Bottle Oral TID WC  . levothyroxine  75 mcg Oral QAC breakfast  . mouth rinse  15 mL Mouth Rinse BID  . Melatonin  3 mg Oral QHS  . multivitamin with minerals  1 tablet Oral Daily  . rivastigmine  1.5 mg Oral BID  . senna  17.2 mg Oral BID   Continuous Infusions: . cefTRIAXone (ROCEPHIN)  IV Stopped (12/27/17 1840)  . vancomycin Stopped (12/28/17 0911)     LOS: 0 days    Time spent: 35    Delaine Lame, MD [facility]  If 7PM-7AM, please contact night-coverage [email or web address] Password [facility] 12/28/2017, 10:25 AM

## 2017-12-28 NOTE — Care Management Obs Status (Signed)
MEDICARE OBSERVATION STATUS NOTIFICATION   Patient Details  Name: Deborah CorpusStella P Matty MRN: [id number] Date of Birth: September 26, 1920   Medicare Observation Status Notification Given:  Other (see comment) AMS unable to sign no familly in room  mi Golda AcreDavis, Cuong Moorman Lynn, RN 12/28/2017, 11:11 AM

## 2017-12-29 ENCOUNTER — Observation Stay (HOSPITAL_COMMUNITY): Payer: Medicare HMO

## 2017-12-29 DIAGNOSIS — R410 Disorientation, unspecified: Secondary | ICD-10-CM | POA: Diagnosis not present

## 2017-12-29 DIAGNOSIS — R05 Cough: Secondary | ICD-10-CM | POA: Diagnosis not present

## 2017-12-29 LAB — RESPIRATORY PANEL BY PCR

## 2017-12-29 LAB — CREATININE, SERUM
Creatinine, Ser: 0.9 mg/dL (ref 0.44–1.00)
GFR calc Af Amer: >60 mL/min (ref >60)
GFR calc non Af Amer: 53 mL/min — ABNORMAL LOW (ref >60)

## 2017-12-29 MED ORDER — IPRATROPIUM-ALBUTEROL 0.5-2.5 (3) MG/3ML IN SOLN: 3.0000 mL | RESPIRATORY_TRACT | Status: DC | PRN

## 2017-12-29 MED ORDER — IPRATROPIUM-ALBUTEROL 0.5-2.5 (3) MG/3ML IN SOLN
3.0000 mL | Freq: Four times a day (QID) | RESPIRATORY_TRACT | Status: DC
  Administered 2017-12-29: 3 mL via RESPIRATORY_TRACT
  Filled 2017-12-29: qty 3

## 2017-12-29 MED ORDER — DOXYCYCLINE HYCLATE 100 MG PO TABS
100.0000 mg | ORAL_TABLET | Freq: Two times a day (BID) | ORAL | Status: DC
  Administered 2017-12-29 – 2018-01-02 (×9): 100 mg via ORAL
  Filled 2017-12-29 (×9): qty 1

## 2017-12-29 MED ORDER — IPRATROPIUM-ALBUTEROL 0.5-2.5 (3) MG/3ML IN SOLN
3.0000 mL | Freq: Three times a day (TID) | RESPIRATORY_TRACT | Status: DC
  Administered 2017-12-29 – 2018-01-01 (×7): 3 mL via RESPIRATORY_TRACT
  Filled 2017-12-29 (×8): qty 3

## 2017-12-29 MED ORDER — METHYLPREDNISOLONE SODIUM SUCC 40 MG IJ SOLR
40.0000 mg | Freq: Four times a day (QID) | INTRAMUSCULAR | Status: DC
  Administered 2017-12-29 – 2017-12-30 (×4): 40 mg via INTRAVENOUS
  Filled 2017-12-29 (×4): qty 1

## 2017-12-29 MED ORDER — BENZONATATE 100 MG PO CAPS
200.0000 mg | ORAL_CAPSULE | Freq: Three times a day (TID) | ORAL | Status: DC
  Administered 2017-12-29 – 2018-01-02 (×12): 200 mg via ORAL
  Filled 2017-12-29 (×12): qty 2

## 2017-12-29 NOTE — Progress Notes (Addendum)
PROGRESS NOTE    [name]  ZOX:[id number] DOB: 05/15/1921 DOA: 12/26/2017 PCP: Ron Parker, MD    Brief Narrative:   84 year old with past medical history relevant for recurrent UTIs, hypothyroidism, dementia with behavioral/mood problems, depression, who was recently admitted and discharged with Aerococcus urinate UTI on 12/01/2017 who presents again with altered mental status and a fever to 100.6 rectally and elevated WBC.      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  Chest x-ray again redemonstrates opacity and some bronchial disease consistent with viral infection. - Every 6 hours bronchodilators -Start IV methylprednisolone 40 mg every 6 hours, wean generously -Discontinue IV ceftriaxone and vancomycin ordered 12/26/2017 -Start oral doxycycline 100 mg twice daily on 12/29/2017 -blood cultures ordered on 12/26/2017 with no growth to date -Procalcitonin quite low, x2 low suspicious for bacterial pneumonia -Respiratory virus panel ordered, pending  #) Dementia with behavioral problems: -Continue revisitigmine 1.5 mg twice daily -Continue risperidone 0.5 mg as needed -Continue clonazepam 0.25 mg twice daily -Continue divalproex 125 mg 3 times daily  #) Hypothyroidism: -Continue levothyroxine 75 mcg daily  #) Aspirin use: There is unclear why this patient is on aspirin.  It is not useful for primary prophylaxis at this age she does not have any medical problems on her list that would necessarily need aspirin any longer. -Consider discharging, will not restart here  #) Loose stool/chronic  diarrhea: - Continue PRN loperamide  Fluids: Tolerating p.o. Elect lites: Monitor and supplement Nutrition: Regular diet  Prophylaxis: Enoxaparin  Disposition: Pending resolution of fever  DO NOT RESUSCITATE     Consultants:   None  Procedures:   None  Antimicrobials:   IV ceftriaxone and vancomycin started 12/26/2017 to 12/29/2017  Oral doxycycline 100 mg twice daily started 12/29/2017   Subjective: Patient is doing fairly well and is pleasantly demented.  Unfortunately she had a fever last night.  She is coughing and wheezing a great deal this morning.  She denies any pain anywhere.  She denies any nausea, vomiting, diarrhea.  Objective: Vitals:   12/28/17 0505 12/28/17 1319 12/28/17 2052 12/29/17 0433  BP: (!) 138/59 (!) 120/57 129/66 (!) 131/51  Pulse: 74 72 65 64  Resp:  14 18 18   Temp:  (!) 100.9 F (38.3 C) 99.3 F (37.4 C) 97.8 F (36.6 C)  TempSrc:  Oral Oral Oral  SpO2: 93% 93% 91% 92%  Weight:      Height:        Intake/Output Summary (Last 24 hours) at 12/29/2017 0958 Last data filed at 12/29/2017 0955 Gross per 24 hour  Intake 520 ml  Output 1750 ml  Net -1230 ml   Filed Weights   12/26/17 1013 12/26/17 1849  Weight: 86.2 kg (190 lb) 81.7 kg (180 lb 1.9 oz)    Examination:  General exam: Appears calm and comfortable  Respiratory system: No increased work of breathing, s diffuse expiratory and expiratory wheezes, no crackles, scattered rhonchi Cardiovascular system: Distant heart sounds, regular rate and rhythm, no murmurs Gastrointestinal system: Soft, nondistended, nontender, no rebound or guarding, plus bowel sounds Central nervous system: Alert but  not oriented, moving all extremities, no focal neurological deficits Extremities: Trace lower extremity edema Skin: No rashes over visible skin Psychiatry: Unable to assess due to underlying dementia    Data Reviewed: I have personally reviewed following labs and imaging  studies  CBC: Recent Labs  Lab 12/26/17 1018 12/27/17 0455 12/28/17 0534  WBC 25.7* 18.7* 10.2  NEUTROABS 23.6*  --   --   HGB 11.7* 10.4* 10.0*  HCT 35.1* 32.5* 29.9*  MCV 90.9 94.2 92.0  PLT 197 165 161   Basic Metabolic Panel: Recent Labs  Lab 12/26/17 1018 12/27/17 0455 12/29/17 0554  NA 141 143  --   K 4.9 4.4  --   CL 106 109  --   CO2 29 24  --   GLUCOSE 125* 87  --   BUN 28* 19  --   CREATININE 1.00 0.96 0.90  CALCIUM 9.1 8.9  --    GFR: Estimated Creatinine Clearance: 40.3 mL/min (by C-G formula based on SCr of 0.9 mg/dL). Liver Function Tests: Recent Labs  Lab 12/26/17 1018  AST 24  ALT 24  ALKPHOS 83  BILITOT 0.5  PROT 6.6  ALBUMIN 3.1*   No results for input(s): LIPASE, AMYLASE in the last 168 hours. No results for input(s): AMMONIA in the last 168 hours. Coagulation Profile: Recent Labs  Lab 12/26/17 1018  INR 0.89   Cardiac Enzymes: No results for input(s): CKTOTAL, CKMB, CKMBINDEX, TROPONINI in the last 168 hours. BNP (last 3 results) No results for input(s): PROBNP in the last 8760 hours. HbA1C: No results for input(s): HGBA1C in the last 72 hours. CBG: Recent Labs  Lab 12/26/17 1732  GLUCAP 109*   Lipid Profile: No results for input(s): CHOL, HDL, LDLCALC, TRIG, CHOLHDL, LDLDIRECT in the last 72 hours. Thyroid Function Tests: No results for input(s): TSH, T4TOTAL, FREET4, T3FREE, THYROIDAB in the last 72 hours. Anemia Panel: No results for input(s): VITAMINB12, FOLATE, FERRITIN, TIBC, IRON, RETICCTPCT in the last 72 hours. Sepsis Labs: Recent Labs  Lab 12/26/17 1025 12/26/17 1300 12/26/17 1947 12/27/17 0455 12/28/17 0534  PROCALCITON  --   --  0.10 <0.10 <0.10  LATICACIDVEN 2.08* 1.44  --   --   --     Recent Results (from the past 240 hour(s))  Culture, blood (Routine x 2)     Status: None (Preliminary result)   Collection Time: 12/26/17 10:15 AM  Result Value Ref Range Status   Specimen Description   Final     BLOOD BLOOD RIGHT FOREARM Performed at [facility], [address] [place], [place] [address]    Special Requests   Final    BOTTLES DRAWN AEROBIC AND ANAEROBIC Blood Culture adequate volume Performed at [facility], [address] [place], [place] [address]    Culture   Final    NO GROWTH 2 DAYS Performed at [facility], [address] [place], [place] [address]    Report Status PENDING  Incomplete  Urine culture     Status: None   Collection Time: 12/26/17 10:15 AM  Result Value Ref Range Status   Specimen Description   Final    URINE, RANDOM Performed at [facility], [address] [place], [place] [address]    Special Requests   Final    NONE Performed at [facility], [address] [place], [place] [address]    Culture   Final    NO GROWTH Performed at  [facility], [address], [place], [place] [address]    Report Status 12/27/2017 FINAL  Final  Culture, blood (Routine x 2)     Status: None (Preliminary result)   Collection Time: 12/26/17 10:18 AM  Result Value Ref Range Status   Specimen Description   Final    BLOOD RIGHT ANTECUBITAL Performed at [facility], [address], [place], [place] [address]    Special Requests   Final    BOTTLES DRAWN AEROBIC AND ANAEROBIC Blood Culture results may not be optimal due to an excessive volume of blood received in culture bottles   Culture   Final    NO GROWTH 2 DAYS Performed at [facility], [address], [place], [place] [address]    Report Status PENDING  Incomplete  MRSA PCR Screening     Status: None   Collection Time: 12/26/17  8:11 PM  Result Value Ref Range Status   MRSA by PCR NEGATIVE NEGATIVE Final    Comment:        The GeneXpert MRSA Assay (FDA approved for NASAL specimens only), is one component of a comprehensive MRSA colonization surveillance program. It is not intended to diagnose  MRSA infection nor to guide or monitor treatment for MRSA infections. Performed at [facility], [address], [place], [place] [address]          Radiology Studies: Dg Chest 1 View  Result Date: 12/29/2017 CLINICAL DATA:  Fever, cough, wheezing. Former smoker. History of asthma. EXAM: CHEST  1 VIEW COMPARISON:  Portable chest x-ray of December 27, 2017 FINDINGS: The lungs are adequately inflated. There is no focal infiltrate. Subtle increased interstitial markings are noted in the right infrahilar region. There is no pleural effusion. The cardiac silhouette is mildly enlarged. The pulmonary vascularity is normal. The mediastinum is normal in width. There is calcification in the wall of the aortic arch. There is mild multilevel degenerative disc disease with endplate spurring. There are degenerative changes of both shoulders. IMPRESSION: No alveolar pneumonia. Subsegmental atelectasis or early infiltrate in the right infrahilar region. When the patient can tolerate the procedure, a PA and lateral chest x-ray would be useful. Thoracic aortic atherosclerosis. Electronically Signed   By: David  [place] M.D.   On: 12/29/2017 08:24   Dg Chest 1 View  Result Date: 12/27/2017 CLINICAL DATA:  Fever EXAM: CHEST  1 VIEW COMPARISON:  12/26/2017 and prior chest radiographs FINDINGS: Equivocal LEFT LOWER lung retrocardiac density noted which may represent atelectasis or airspace disease. Minimal RIGHT basilar atelectasis/scarring noted. Cardiomegaly identified. No pneumothorax or pleural effusion. No acute bony abnormalities identified. Degenerative changes in the RIGHT shoulder again noted. IMPRESSION: Equivocal LEFT LOWER lung retrocardiac density which may represent atelectasis or possibly airspace disease. Cardiomegaly. Electronically Signed   By: Harmon Pier M.D.   On: 12/27/2017 11:19        Scheduled Meds: . divalproex  125 mg Oral TID  . enoxaparin (LOVENOX) injection  40 mg  Subcutaneous Q24H  . feeding supplement (ENSURE ENLIVE)  1 Bottle Oral TID WC  . ipratropium-albuterol  3 mL Nebulization Q6H  . levothyroxine  75 mcg Oral QAC breakfast  . mouth rinse  15 mL Mouth Rinse BID  . Melatonin  3 mg Oral QHS  . multivitamin with minerals  1 tablet Oral Daily  . rivastigmine  1.5 mg Oral BID  . senna  17.2 mg Oral BID   Continuous Infusions: . cefTRIAXone (ROCEPHIN)  IV Stopped (12/28/17 1739)  . vancomycin Stopped (12/28/17 0911)     LOS: 0 days    Time spent: 35    Delaine Lame, MD [facility]  If 7PM-7AM, please contact night-coverage [email or web address] Password [email or web address] 12/29/2017, 9:58 AM

## 2017-12-30 DIAGNOSIS — E039 Hypothyroidism, unspecified: Secondary | ICD-10-CM | POA: Diagnosis present

## 2017-12-30 DIAGNOSIS — D72829 Elevated white blood cell count, unspecified: Secondary | ICD-10-CM

## 2017-12-30 DIAGNOSIS — J129 Viral pneumonia, unspecified: Secondary | ICD-10-CM | POA: Diagnosis present

## 2017-12-30 DIAGNOSIS — N390 Urinary tract infection, site not specified: Secondary | ICD-10-CM | POA: Diagnosis present

## 2017-12-30 DIAGNOSIS — E871 Hypo-osmolality and hyponatremia: Secondary | ICD-10-CM | POA: Diagnosis not present

## 2017-12-30 DIAGNOSIS — Z87891 Personal history of nicotine dependence: Secondary | ICD-10-CM | POA: Diagnosis not present

## 2017-12-30 DIAGNOSIS — J45909 Unspecified asthma, uncomplicated: Secondary | ICD-10-CM | POA: Diagnosis present

## 2017-12-30 DIAGNOSIS — E870 Hyperosmolality and hypernatremia: Secondary | ICD-10-CM | POA: Diagnosis present

## 2017-12-30 DIAGNOSIS — G2581 Restless legs syndrome: Secondary | ICD-10-CM | POA: Diagnosis present

## 2017-12-30 DIAGNOSIS — Z8249 Family history of ischemic heart disease and other diseases of the circulatory system: Secondary | ICD-10-CM | POA: Diagnosis not present

## 2017-12-30 DIAGNOSIS — Z7982 Long term (current) use of aspirin: Secondary | ICD-10-CM | POA: Diagnosis not present

## 2017-12-30 DIAGNOSIS — F0281 Dementia in other diseases classified elsewhere with behavioral disturbance: Secondary | ICD-10-CM | POA: Diagnosis present

## 2017-12-30 DIAGNOSIS — Z781 Physical restraint status: Secondary | ICD-10-CM | POA: Diagnosis not present

## 2017-12-30 DIAGNOSIS — E860 Dehydration: Secondary | ICD-10-CM

## 2017-12-30 DIAGNOSIS — G309 Alzheimer's disease, unspecified: Secondary | ICD-10-CM

## 2017-12-30 DIAGNOSIS — K529 Noninfective gastroenteritis and colitis, unspecified: Secondary | ICD-10-CM | POA: Diagnosis present

## 2017-12-30 DIAGNOSIS — F329 Major depressive disorder, single episode, unspecified: Secondary | ICD-10-CM | POA: Diagnosis present

## 2017-12-30 DIAGNOSIS — F0280 Dementia in other diseases classified elsewhere without behavioral disturbance: Secondary | ICD-10-CM

## 2017-12-30 DIAGNOSIS — Z79890 Hormone replacement therapy (postmenopausal): Secondary | ICD-10-CM | POA: Diagnosis not present

## 2017-12-30 DIAGNOSIS — F05 Delirium due to known physiological condition: Secondary | ICD-10-CM

## 2017-12-30 DIAGNOSIS — Z79899 Other long term (current) drug therapy: Secondary | ICD-10-CM | POA: Diagnosis not present

## 2017-12-30 DIAGNOSIS — R404 Transient alteration of awareness: Secondary | ICD-10-CM

## 2017-12-30 DIAGNOSIS — Z66 Do not resuscitate: Secondary | ICD-10-CM | POA: Diagnosis present

## 2017-12-30 DIAGNOSIS — I1310 Hypertensive heart and chronic kidney disease without heart failure, with stage 1 through stage 4 chronic kidney disease, or unspecified chronic kidney disease: Secondary | ICD-10-CM | POA: Diagnosis present

## 2017-12-30 DIAGNOSIS — Z87440 Personal history of urinary (tract) infections: Secondary | ICD-10-CM | POA: Diagnosis not present

## 2017-12-30 DIAGNOSIS — D638 Anemia in other chronic diseases classified elsewhere: Secondary | ICD-10-CM | POA: Diagnosis present

## 2017-12-30 DIAGNOSIS — R0902 Hypoxemia: Secondary | ICD-10-CM | POA: Diagnosis present

## 2017-12-30 DIAGNOSIS — G9341 Metabolic encephalopathy: Secondary | ICD-10-CM | POA: Diagnosis present

## 2017-12-30 DIAGNOSIS — Z86010 Personal history of colonic polyps: Secondary | ICD-10-CM | POA: Diagnosis not present

## 2017-12-30 DIAGNOSIS — R410 Disorientation, unspecified: Secondary | ICD-10-CM | POA: Diagnosis not present

## 2017-12-30 LAB — CBC WITH DIFFERENTIAL/PLATELET
BASOS ABS: 0 10*3/uL (ref 0.0–0.1)
BASOS PCT: 0 %
EOS PCT: 0 %
Eosinophils Absolute: 0 10*3/uL (ref 0.0–0.7)
HEMATOCRIT: 32.3 % — AB (ref 36.0–46.0)
Hemoglobin: 10.8 g/dL — ABNORMAL LOW (ref 12.0–15.0)
LYMPHS PCT: 12 %
Lymphs Abs: 1.1 10*3/uL (ref 0.7–4.0)
MCH: 30.6 pg (ref 26.0–34.0)
MCHC: 33.4 g/dL (ref 30.0–36.0)
MCV: 91.5 fL (ref 78.0–100.0)
MONO ABS: 0.5 10*3/uL (ref 0.1–1.0)
MONOS PCT: 5 %
NEUTROS ABS: 7.6 10*3/uL (ref 1.7–7.7)
Neutrophils Relative %: 83 %
PLATELETS: 161 10*3/uL (ref 150–400)
RBC: 3.53 MIL/uL — ABNORMAL LOW (ref 3.87–5.11)
RDW: 13.8 % (ref 11.5–15.5)
WBC: 9.2 10*3/uL (ref 4.0–10.5)

## 2017-12-30 LAB — COMPREHENSIVE METABOLIC PANEL
ALBUMIN: 3 g/dL — AB (ref 3.5–5.0)
ALT: 20 U/L (ref 0–44)
ALT: 19 U/L (ref 0–44)
ANION GAP: 11 (ref 5–15)
ANION GAP: 12 (ref 5–15)
AST: 16 U/L (ref 15–41)
AST: 15 U/L (ref 15–41)
Albumin: 3 g/dL — ABNORMAL LOW (ref 3.5–5.0)
Alkaline Phosphatase: 81 U/L (ref 38–126)
Alkaline Phosphatase: 74 U/L (ref 38–126)
BILIRUBIN TOTAL: 0.4 mg/dL (ref 0.3–1.2)
BILIRUBIN TOTAL: 0.3 mg/dL (ref 0.3–1.2)
BUN: 32 mg/dL — ABNORMAL HIGH (ref 8–23)
BUN: 38 mg/dL — AB (ref 8–23)
CALCIUM: 9.3 mg/dL (ref 8.9–10.3)
CHLORIDE: 109 mmol/L (ref 98–111)
CO2: 30 mmol/L (ref 22–32)
CO2: 26 mmol/L (ref 22–32)
CREATININE: 1.01 mg/dL — AB (ref 0.44–1.00)
Calcium: 9.6 mg/dL (ref 8.9–10.3)
Chloride: 105 mmol/L (ref 98–111)
Creatinine, Ser: 0.96 mg/dL (ref 0.44–1.00)
GFR calc Af Amer: 56 mL/min — ABNORMAL LOW (ref >60)
GFR, EST AFRICAN AMERICAN: 53 mL/min — AB (ref >60)
GFR, EST NON AFRICAN AMERICAN: 49 mL/min — AB (ref >60)
GFR, EST NON AFRICAN AMERICAN: 46 mL/min — AB (ref >60)
GLUCOSE: 176 mg/dL — AB (ref 70–99)
Glucose, Bld: 143 mg/dL — ABNORMAL HIGH (ref 70–99)
POTASSIUM: 4.6 mmol/L (ref 3.5–5.1)
POTASSIUM: 4.4 mmol/L (ref 3.5–5.1)
Sodium: 150 mmol/L — ABNORMAL HIGH (ref 135–145)
Sodium: 143 mmol/L (ref 135–145)
TOTAL PROTEIN: 6.8 g/dL (ref 6.5–8.1)
Total Protein: 6.7 g/dL (ref 6.5–8.1)

## 2017-12-30 LAB — MAGNESIUM: MAGNESIUM: 2.1 mg/dL (ref 1.7–2.4)

## 2017-12-30 LAB — PHOSPHORUS: Phosphorus: 4.4 mg/dL (ref 2.5–4.6)

## 2017-12-30 MED ORDER — GUAIFENESIN ER 600 MG PO TB12
600.0000 mg | ORAL_TABLET | Freq: Two times a day (BID) | ORAL | Status: DC
  Administered 2017-12-30 – 2018-01-02 (×7): 600 mg via ORAL
  Filled 2017-12-30 (×7): qty 1

## 2017-12-30 MED ORDER — DEXTROSE 5 % IV SOLN
INTRAVENOUS | Status: AC
  Administered 2017-12-30: 12:00:00 via INTRAVENOUS

## 2017-12-30 MED ORDER — METHYLPREDNISOLONE SODIUM SUCC 40 MG IJ SOLR
40.0000 mg | Freq: Two times a day (BID) | INTRAMUSCULAR | Status: DC
  Administered 2017-12-30 – 2017-12-31 (×2): 40 mg via INTRAVENOUS
  Filled 2017-12-30 (×2): qty 1

## 2017-12-30 NOTE — Clinical Social Work Note (Signed)
Clinical Social Work Assessment  Patient Details  Name: [name] MRN: [id number] Date of Birth: [birth year]  Date of referral:  12/30/17               Reason for consult:  Discharge Planning                Permission sought to share information with:  Case Manager, Facility Medical sales representativeContact Representative, Family Supports Permission granted to share information::  No(Patient not oriented)  Name::     Public affairs consultantCharles  Agency::  [facility]  Relationship::  Son  SolicitorContact Information:     Housing/Transportation Living arrangements for the past 2 months:  Assisted Living Facility(Memory Care) Source of Information:  Adult Children Patient Interpreter Needed:  None Criminal Activity/Legal Involvement Pertinent to Current Situation/Hospitalization:  No - Comment as needed Significant Relationships:  Adult Children, Merchandiser, retailCommunity Support Lives with:  Facility Resident Do you feel safe going back to the place where you live?  Yes Need for family participation in patient care:  Yes (Comment)  Care giving concerns:  No care giving concerns at the time of assessment.    Social Worker assessment / plan:  LCSW consulted for facility placement.   [facility].  LCSW spoke with son [name] by phone. According to [facility] patient has been a resident at [facility] for 4 years. He reports that patient uses a wheel chair at [place] and needs assistance with all ADLs.   Plan: [facility] will [facility]   Employment status:  Retired Database administratornsurance information:  Managed Medicare PT Recommendations:  Not assessed at this time Information / Referral to community resources:     Patient/Family's Response to care:  Son, [name] is thankful for LCSW visit and coordination with dc planning.   Patient/Family's Understanding of and Emotional Response to Diagnosis, Current Treatment, and Prognosis:  [name] appears to be proactive in patient's care. [name] asked  questions regarding patient's discharge. [name] is agreeable to return when patient is medically stable.   Emotional Assessment Appearance:  Appears stated age Attitude/Demeanor/Rapport:    Affect (typically observed):  Calm Orientation:  Oriented to Self Alcohol / Substance use:  Not Applicable Psych involvement (Current and /or in the community):  No (Comment)  Discharge Needs  Concerns to be addressed:  No discharge needs identified Readmission within the last 30 days:  Yes Current discharge risk:  None Barriers to Discharge:  Continued Medical Work up   Deborah Jatavian Calica, LCSW 12/30/2017, 3:33 PM

## 2017-12-30 NOTE — Progress Notes (Addendum)
LCSW consulted for Facility placement. [facility] is from [facility].   LCSW attempted to reach family to complete assessment.   LCSW left message to return call.   LCSW attempted to reach facility. No answer and mailbox is full.   LCSW will continue to follow for dc needs.

## 2017-12-30 NOTE — Progress Notes (Signed)
PROGRESS NOTE    Deborah CorpusStella P Horton  ZOX:[id number] DOB: [birth year] DOA: 12/26/2017 PCP: Ron ParkerBowen, [name], MD   Brief Narrative:  The patient is a 84 year old with past medical history relevant for recurrent UTIs, hypothyroidism, dementia with behavioral/mood problems, depression, who was recently admitted and discharged with Aerococcus urinae UTI on 12/01/2017 who presents again with altered mental status and a fever to 100.6 rectally and elevated WBC at 25.7. WBC improved and is now normalized but patient still coughing and it is nonproductive. Hospitalization complicated by Hypernatremia.     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   -Chest x-ray again redemonstrates opacity and some bronchial disease consistent with viral infection. -Started Patient on DuoNEb q6h and weaned to TID and will continue DuoNeb q2hprn Wheezing and SOB -Started IV methylprednisolone 40 mg every 6 hours, weaned to 40 mg q12h  -Discontinue IV ceftriaxone and vancomycin ordered 12/26/2017 -Start Oral doxycycline 100 mg twice daily on 12/29/2017 and will continue  -Blood cultures ordered on 12/26/2017 with no growth to date -Procalcitonin quite low, x3 low suspicious for bacterial pneumonia -Respiratory virus panel Negative -Started Guaifenesin 600 mg po BID and Guaifenesin Solution 200 mg po q6hprn Cough  -Started Flutter Valve and Incentive Spirometry    Dementia with Behavioral Problems -Continue Rivasitigmine 1.5 mg twice daily -Continue Risperidone 0.5 mg as needed 0.5 mg Daily PRN Severe  Agitation/Anxiety  -Continue Clonazepam 0.25 mg twice daily PRN -Continue Divalproex 125 mg 3 times daily -C/w Trazodone 50 mg po qHS prn Sleep   Hypothyroidism -Continue Levothyroxine 75 mcg daily  Loose stool/chronic diarrhea -Continue PRN loperamide  Hypernatremia -Worsened -Patient's Na+ went from 141 -> 150 -Started Patient on D5W at a rate of 50 mL/hr -Continue to Monitor and Repeat CMP as an outpatient   Elevated BUN -In the setting of IV Steroids -Weaning Solumedrol to 40 mg IV q12h -Unlikely from Upper GIB -Continue to Monitor and Repeat CMP in AM   Normocytic Anemia -Likely of Chronic Disease -Check Anemia Panel in AM  -Continue to Monitor for S/Sx of Bleeding -Repeat CBC in AM   DVT prophylaxis: Enoxaparin 40 mg sq q24h Code Status: DO NOT RESUSCITATE  Family Communication: No family present at bedside  Disposition Plan:   Consultants:   None    Procedures:   Antimicrobials:  Anti-infectives (From admission, onward)   Start     Dose/Rate Route Frequency Ordered Stop   12/29/17 1300  doxycycline (VIBRA-TABS) tablet 100 mg     100 mg Oral Every 12 hours 12/29/17 1144     12/28/17 0800  vancomycin (VANCOCIN) IVPB 1000 mg/200 mL premix  Status:  Discontinued     1,000 mg 200 mL/hr over 60 Minutes Intravenous Every 36 hours 12/26/17 1813 12/27/17 0751   12/28/17 0800  vancomycin (VANCOCIN) IVPB 1000 mg/200 mL premix  Status:  Discontinued     1,000 mg 200 mL/hr over 60 Minutes Intravenous Every 36 hours 12/27/17 1141 12/29/17 1144   12/27/17 1800  cefTRIAXone (ROCEPHIN) 2 g in sodium chloride 0.9 % 100 mL IVPB  Status:  Discontinued     2 g 200 mL/hr over 30 Minutes Intravenous Every 24 hours 12/27/17 1128 12/29/17 1144   12/26/17 2000  vancomycin (VANCOCIN) 1,500 mg in sodium chloride 0.9 % 500 mL IVPB     1,500 mg 250 mL/hr over 120 Minutes Intravenous  Once 12/26/17 1813 12/26/17 2144   12/26/17 1615  cefTRIAXone (ROCEPHIN) 2 g in sodium chloride  0.9 % 100 mL IVPB  Status:  Discontinued     2 g 200 mL/hr over 30 Minutes Intravenous Every 24 hours 12/26/17 1612 12/27/17 0751     Subjective: Seen and examined at bedside and still pleasantly confused.  No chest pain or shortness of breath.  Still has a cough but states she feels better.  No other concerns or complaints at this time.  Objective: Vitals:   12/29/17 1953 12/29/17 2037 12/30/17 1320 12/30/17 1345  BP:  (!) 152/63  (!) 116/45  Pulse:  83  60  Resp:  20  18  Temp:  98.6 F (37 C)    TempSrc:  Oral    SpO2: 94% 93% 94% 93%  Weight:      Height:        Intake/Output Summary (Last 24 hours) at 12/30/2017 1507 Last data filed at 12/30/2017 1422 Gross per 24 hour  Intake 229.22 ml  Output -  Net 229.22 ml   Filed Weights   12/26/17 1013 12/26/17 1849  Weight: 86.2 kg (190 lb) 81.7 kg (180 lb 1.9 oz)   Examination: Physical Exam:  Constitutional: WN/WD elderly pleasantly confused Caucasian female in NAD and appears calm  Eyes: Lids and conjunctivae normal, sclerae anicteric  ENMT: External Ears, Nose appear normal. Slightly hard of hearing.  Neck: Appears normal, supple, no cervical masses, normal ROM, no appreciable thyromegaly, no JVD Respiratory: Diminished to auscultation bilaterally, no wheezing, rales, rhonchi or crackles. Normal respiratory effort and patient is not tachypenic. No accessory muscle use.  Cardiovascular: RRR; Slight Murmur. S1 and S2 auscultated. No extremity edema  Abdomen: Soft, non-tender, non-distended. No masses palpated. No appreciable hepatosplenomegaly. Bowel sounds positive x4.  GU: Deferred. Musculoskeletal: No clubbing / cyanosis of digits/nails. No joint deformity upper and lower extremities.  Skin: No rashes, lesions, ulcers on a limited skin evaluation. No induration; Warm and dry.  Neurologic: CN 2-12 grossly intact with no focal deficits. Romberg sign and cerebellar reflexes not assessed.  Psychiatric: Impaired judgment and  insight. Awake but oriented x 1. Normal mood and appropriate affect.   Data Reviewed: I have personally reviewed following labs and imaging studies  CBC: Recent Labs  Lab 12/26/17 1018 12/27/17 0455 12/28/17 0534 12/30/17 0822  WBC 25.7* 18.7* 10.2 9.2  NEUTROABS 23.6*  --   --  7.6  HGB 11.7* 10.4* 10.0* 10.8*  HCT 35.1* 32.5* 29.9* 32.3*  MCV 90.9 94.2 92.0 91.5  PLT 197 165 161 161   Basic Metabolic Panel: Recent Labs  Lab 12/26/17 1018 12/27/17 0455 12/29/17 0554 12/30/17 0822  NA 141 143  --  150*  K 4.9 4.4  --  4.6  CL 106 109  --  109  CO2 29 24  --  30  GLUCOSE 125* 87  --  143*  BUN 28* 19  --  32*  CREATININE 1.00 0.96 0.90 0.96  CALCIUM 9.1 8.9  --  9.6  MG  --   --   --  2.1  PHOS  --   --   --  4.4   GFR: Estimated  Creatinine Clearance: 37.8 mL/min (by C-G formula based on SCr of 0.96 mg/dL). Liver Function Tests: Recent Labs  Lab 12/26/17 1018 12/30/17 0822  AST 24 16  ALT 24 20  ALKPHOS 83 81  BILITOT 0.5 0.4  PROT 6.6 6.8  ALBUMIN 3.1* 3.0*   No results for input(s): LIPASE, AMYLASE in the last 168 hours. No results for input(s): AMMONIA in the last 168 hours. Coagulation Profile: Recent Labs  Lab 12/26/17 1018  INR 0.89   Cardiac Enzymes: No results for input(s): CKTOTAL, CKMB, CKMBINDEX, TROPONINI in the last 168 hours. BNP (last 3 results) No results for input(s): PROBNP in the last 8760 hours. HbA1C: No results for input(s): HGBA1C in the last 72 hours. CBG: Recent Labs  Lab 12/26/17 1732  GLUCAP 109*   Lipid Profile: No results for input(s): CHOL, HDL, LDLCALC, TRIG, CHOLHDL, LDLDIRECT in the last 72 hours. Thyroid Function Tests: No results for input(s): TSH, T4TOTAL, FREET4, T3FREE, THYROIDAB in the last 72 hours. Anemia Panel: No results for input(s): VITAMINB12, FOLATE, FERRITIN, TIBC, IRON, RETICCTPCT in the last 72 hours. Sepsis Labs: Recent Labs  Lab 12/26/17 1025 12/26/17 1300 12/26/17 1947 12/27/17 0455  12/28/17 0534  PROCALCITON  --   --  0.10 <0.10 <0.10  LATICACIDVEN 2.08* 1.44  --   --   --     Recent Results (from the past 240 hour(s))  Culture, blood (Routine x 2)     Status: None (Preliminary result)   Collection Time: 12/26/17 10:15 AM  Result Value Ref Range Status   Specimen Description   Final    BLOOD BLOOD RIGHT FOREARM Performed at [facility], [address] [place], [place] [address]    Special Requests   Final    BOTTLES DRAWN AEROBIC AND ANAEROBIC Blood Culture adequate volume Performed at [facility], [address], [place], [place] [address]    Culture   Final    NO GROWTH 4 DAYS Performed at [facility], [address], [place], [place] [address]    Report Status PENDING  Incomplete  Urine culture     Status: None   Collection Time: 12/26/17 10:15 AM  Result Value Ref Range Status   Specimen Description   Final    URINE, RANDOM Performed at [facility], [address], [place], [place] [address]    Special Requests   Final    NONE Performed at [facility], [address] [place], [place] [address]    Culture   Final    NO GROWTH Performed at [facility], [address] [place], [place] [address]    Report Status 12/27/2017 FINAL  Final  Culture, blood (Routine x 2)     Status: None (Preliminary result)   Collection Time: 12/26/17 10:18 AM  Result Value Ref Range Status   Specimen Description   Final    BLOOD RIGHT ANTECUBITAL Performed at [facility], [address], [place], [place] [address]    Special Requests   Final    BOTTLES DRAWN AEROBIC AND ANAEROBIC Blood Culture results may not be optimal due to an excessive volume of blood received in culture bottles   Culture   Final    NO GROWTH 4 DAYS Performed at [facility], [address], [place], [place] [address]    Report Status PENDING  Incomplete  MRSA PCR Screening      Status: None  Collection Time: 12/26/17  8:11 PM  Result Value Ref Range Status   MRSA by PCR NEGATIVE NEGATIVE Final    Comment:        The GeneXpert MRSA Assay (FDA approved for NASAL specimens only), is one component of a comprehensive MRSA colonization surveillance program. It is not intended to diagnose MRSA infection nor to guide or monitor treatment for MRSA infections. Performed at [facility], [address] [place], [place] [address]   Respiratory Panel by PCR     Status: None   Collection Time: 12/29/17  7:34 AM  Result Value Ref Range Status   Adenovirus NOT DETECTED NOT DETECTED Final   Coronavirus 229E NOT DETECTED NOT DETECTED Final   Coronavirus HKU1 NOT DETECTED NOT DETECTED Final   Coronavirus NL63 NOT DETECTED NOT DETECTED Final   Coronavirus OC43 NOT DETECTED NOT DETECTED Final   Metapneumovirus NOT DETECTED NOT DETECTED Final   Rhinovirus / Enterovirus NOT DETECTED NOT DETECTED Final   Influenza A NOT DETECTED NOT DETECTED Final   Influenza B NOT DETECTED NOT DETECTED Final   Parainfluenza Virus 1 NOT DETECTED NOT DETECTED Final   Parainfluenza Virus 2 NOT DETECTED NOT DETECTED Final   Parainfluenza Virus 3 NOT DETECTED NOT DETECTED Final   Parainfluenza Virus 4 NOT DETECTED NOT DETECTED Final   Respiratory Syncytial Virus NOT DETECTED NOT DETECTED Final   Bordetella pertussis NOT DETECTED NOT DETECTED Final   Chlamydophila pneumoniae NOT DETECTED NOT DETECTED Final   Mycoplasma pneumoniae NOT DETECTED NOT DETECTED Final    Comment: Performed at [facility], [address] [place], [place] [address]    Radiology Studies: Dg Chest 1 View  Result Date: 12/29/2017 CLINICAL DATA:  Fever, cough, wheezing. Former smoker. History of asthma. EXAM: CHEST  1 VIEW COMPARISON:  Portable chest x-ray of December 27, 2017 FINDINGS: The lungs are adequately inflated. There is no focal infiltrate. Subtle increased interstitial markings are noted  in the right infrahilar region. There is no pleural effusion. The cardiac silhouette is mildly enlarged. The pulmonary vascularity is normal. The mediastinum is normal in width. There is calcification in the wall of the aortic arch. There is mild multilevel degenerative disc disease with endplate spurring. There are degenerative changes of both shoulders. IMPRESSION: No alveolar pneumonia. Subsegmental atelectasis or early infiltrate in the right infrahilar region. When the patient can tolerate the procedure, a PA and lateral chest x-ray would be useful. Thoracic aortic atherosclerosis. Electronically Signed   By: David  [place] M.D.   On: 12/29/2017 08:24   Scheduled Meds: . benzonatate  200 mg Oral TID  . divalproex  125 mg Oral TID  . doxycycline  100 mg Oral Q12H  . enoxaparin (LOVENOX) injection  40 mg Subcutaneous Q24H  . feeding supplement (ENSURE ENLIVE)  1 Bottle Oral TID WC  . guaiFENesin  600 mg Oral BID  . ipratropium-albuterol  3 mL Nebulization TID  . levothyroxine  75 mcg Oral QAC breakfast  . mouth rinse  15 mL Mouth Rinse BID  . Melatonin  3 mg Oral QHS  . methylPREDNISolone (SOLU-MEDROL) injection  40 mg Intravenous Q12H  . multivitamin with minerals  1 tablet Oral Daily  . rivastigmine  1.5 mg Oral BID  . senna  17.2 mg Oral BID   Continuous Infusions: . dextrose 50 mL/hr at 12/30/17 1422    LOS: 0 days   Merlene Laughter, DO [facility] Pager [phone]  If 7PM-7AM, please contact night-coverage [email or web address]  Password [email or web address] 12/30/2017, 3:07 PM

## 2017-12-31 DIAGNOSIS — R410 Disorientation, unspecified: Secondary | ICD-10-CM

## 2017-12-31 LAB — CULTURE, BLOOD (ROUTINE X 2)
CULTURE: NO GROWTH
CULTURE: NO GROWTH
SPECIAL REQUESTS: ADEQUATE

## 2017-12-31 LAB — CBC WITH DIFFERENTIAL/PLATELET
BASOS PCT: 0 %
Basophils Absolute: 0 10*3/uL (ref 0.0–0.1)
Eosinophils Absolute: 0 10*3/uL (ref 0.0–0.7)
Eosinophils Relative: 0 %
HEMATOCRIT: 30.9 % — AB (ref 36.0–46.0)
HEMOGLOBIN: 10.5 g/dL — AB (ref 12.0–15.0)
Lymphocytes Relative: 7 %
Lymphs Abs: 1 10*3/uL (ref 0.7–4.0)
MCH: 30.5 pg (ref 26.0–34.0)
MCHC: 34 g/dL (ref 30.0–36.0)
MCV: 89.8 fL (ref 78.0–100.0)
Monocytes Absolute: 0.3 10*3/uL (ref 0.1–1.0)
Monocytes Relative: 2 %
NEUTROS ABS: 12.7 10*3/uL — AB (ref 1.7–7.7)
NEUTROS PCT: 91 %
Platelets: 176 10*3/uL (ref 150–400)
RBC: 3.44 MIL/uL — AB (ref 3.87–5.11)
RDW: 13.8 % (ref 11.5–15.5)
WBC: 13.9 10*3/uL — AB (ref 4.0–10.5)

## 2017-12-31 LAB — IRON AND TIBC
IRON: 59 ug/dL (ref 28–170)
Saturation Ratios: 16 % (ref 10.4–31.8)
TIBC: 380 ug/dL (ref 250–450)
UIBC: 321 ug/dL

## 2017-12-31 LAB — PHOSPHORUS: PHOSPHORUS: 3.9 mg/dL (ref 2.5–4.6)

## 2017-12-31 LAB — RETICULOCYTES
RBC.: 3.44 MIL/uL — ABNORMAL LOW (ref 3.87–5.11)
RETIC COUNT ABSOLUTE: 65.4 10*3/uL (ref 19.0–186.0)
Retic Ct Pct: 1.9 % (ref 0.4–3.1)

## 2017-12-31 LAB — FOLATE: FOLATE: 33.5 ng/mL (ref >5.9)

## 2017-12-31 LAB — MAGNESIUM: Magnesium: 2.1 mg/dL (ref 1.7–2.4)

## 2017-12-31 LAB — VITAMIN B12: VITAMIN B 12: 963 pg/mL — AB (ref 180–914)

## 2017-12-31 LAB — FERRITIN: FERRITIN: 94 ng/mL (ref 11–307)

## 2017-12-31 MED ORDER — HALOPERIDOL LACTATE 5 MG/ML IJ SOLN
5.0000 mg | Freq: Once | INTRAMUSCULAR | Status: AC
  Administered 2017-12-31: 5 mg via INTRAVENOUS
  Filled 2017-12-31: qty 1

## 2017-12-31 MED ORDER — HALOPERIDOL LACTATE 5 MG/ML IJ SOLN
2.0000 mg | Freq: Four times a day (QID) | INTRAMUSCULAR | Status: DC | PRN
  Administered 2018-01-02: 2 mg via INTRAVENOUS
  Filled 2017-12-31: qty 1

## 2017-12-31 MED ORDER — HALOPERIDOL LACTATE 5 MG/ML IJ SOLN
INTRAMUSCULAR | Status: AC
  Administered 2017-12-31: 2.5 mg
  Filled 2017-12-31: qty 1
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
## 2017-12-31 NOTE — Progress Notes (Signed)
Pt remains awake , talking rambling on a lot about everything, jumbled thoughts, statements don't make sense. Pleasantly confused. This RN attempted to re- orient pt and encourage sleep & pt has no desire to sleep

## 2017-12-31 NOTE — Progress Notes (Signed)
PROGRESS NOTE    ELTON CATALANO  WUJ:[id number] DOB: 1920/12/18 DOA: 12/26/2017 PCP: Ron [place], MD   Brief Narrative: Patient is a 84 year old female from [facility] with past medical history of recurrent UTI, hypothyroidism, dementia with behavioral/mood problems, depression who was presented with altered mental status, fever and elevated white cell counts.  Patient was recently admitted and discharged with  Aerococcus urinae UTI on 12/01/17.  Patient's hospital course was remarkable for agitation, confusion.  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  This was most likely contributed by her underlying dementia on metabolic encephalopathy secondary to viral pneumonia .  We will continue to monitor mental status.  She had to be given Haldol for making her calm this morning.  Patient is a resident of memory care center. She was on steroids which could also cause confusion, agitation.  Steroids discontinued today.  Viral pneumonia: Chest x-ray showed opacity.  Procalcitonin was negative.  Blood cultures have not shown any growth.  Started on doxycycline.  She also has mild leukocytosis.  Dementia with behavioral problems: On rivastigmine, risperidone, clonazepam, divalproex and trazodone.  Significantly agitated this morning.  Had to be put on restraints.  Hypothyroidism: Continue levothyroxine.  Chronic diarrhea: Continue PRN loperamide.  Hyponatremia: Currently sodium level stable.  Continue D5W at the rate of 50 mL/hr  Normocytic  Anemia: Secondary to chronic disease.  Patient is currently stable.  Disposition: Patient will be discharged to [facility] care after her mental status  improves.    DVT prophylaxis: Lovenox Code Status: DNR Family Communication: None present at the bedside Disposition Plan: Back to memory care center in 1 to 2 days   Consultants: None  Procedures: None  Antimicrobials: Doxycycline  Subjective: Patient seen and examined at bedside this morning.  Very agitated and confused.  Shouting.  Consistently trying to remove the mittens from her hands  Objective: Vitals:   12/30/17 2010 12/31/17 0436 12/31/17 0829 12/31/17 1016  BP:  (!) 143/64    Pulse:  (!) 45    Resp:      Temp:  (!) 97.4 F (36.3 C)    TempSrc:      SpO2: 93% 98% 95% 96%  Weight:      Height:        Intake/Output Summary (Last 24 hours) at 12/31/2017 1400 Last data filed at 12/31/2017 1200 Gross per 24 hour  Intake 1447.71 ml  Output -  Net 1447.71 ml   Filed Weights   12/26/17 1013 12/26/17 1849  Weight: 86.2 kg (190 lb) 81.7 kg (180 lb 1.9 oz)    Examination:  General exam: Agitated HEENT:PERRL,Oral mucosa moist, Ear/Nose normal on gross exam Respiratory system: Bilateral equal air entry, normal vesicular breath sounds, no wheezes or crackles  Cardiovascular system: S1 & S2 heard, RRR. No JVD, murmurs, rubs, gallops or clicks. No pedal edema. Gastrointestinal system: Abdomen is nondistended, soft and nontender. No organomegaly or masses felt. Normal bowel sounds heard. Central nervous system: Alert  but not oriented. No focal neurological deficits. Extremities: No edema, no clubbing ,no cyanosis, distal peripheral pulses palpable. Skin: No rashes, lesions or ulcers,no icterus ,no pallor MSK: Normal muscle bulk,tone ,power Psychiatry: Judgement and  insight appear impaired   Data Reviewed: I have personally reviewed following labs and imaging studies  CBC: Recent Labs  Lab 12/26/17 1018 12/27/17 0455 12/28/17 0534 12/30/17 0822 12/31/17 0600  WBC 25.7* 18.7* 10.2 9.2 13.9*  NEUTROABS 23.6*  --   --  7.6 12.7*  HGB 11.7* 10.4* 10.0* 10.8*  10.5*  HCT 35.1* 32.5* 29.9* 32.3* 30.9*  MCV 90.9 94.2 92.0 91.5 89.8  PLT 197 165 161 161 176   Basic Metabolic Panel: Recent Labs  Lab 12/26/17 1018 12/27/17 0455 12/29/17 0554 12/30/17 0822 12/30/17 1555 12/31/17 0600  NA 141 143  --  150* 143  --   K 4.9 4.4  --  4.6 4.4  --   CL 106 109  --  109 105  --   CO2 29 24  --  30 26  --   GLUCOSE 125* 87  --  143* 176*  --   BUN 28* 19  --  32* 38*  --   CREATININE 1.00 0.96 0.90 0.96 1.01*  --   CALCIUM 9.1 8.9  --  9.6 9.3  --   MG  --   --   --  2.1  --  2.1  PHOS  --   --   --  4.4  --  3.9   GFR: Estimated Creatinine Clearance: 35.9 mL/min (A) (by C-G formula based on SCr of 1.01 mg/dL (H)). Liver Function Tests: Recent Labs  Lab 12/26/17 1018 12/30/17 0822 12/30/17 1555  AST 24 16 15   ALT 24 20 19   ALKPHOS 83 81 74  BILITOT 0.5 0.4 0.3  PROT 6.6 6.8 6.7  ALBUMIN 3.1* 3.0* 3.0*   No results for input(s): LIPASE, AMYLASE in the last 168 hours. No results for input(s): AMMONIA in the last 168 hours. Coagulation Profile: Recent Labs  Lab 12/26/17 1018  INR 0.89   Cardiac Enzymes: No results for input(s): CKTOTAL, CKMB, CKMBINDEX, TROPONINI in the last 168 hours. BNP (last 3 results) No results for input(s): PROBNP in the last 8760 hours. HbA1C: No results for input(s): HGBA1C in the last 72 hours. CBG: Recent Labs  Lab 12/26/17 1732  GLUCAP 109*   Lipid Profile: No results for input(s): CHOL, HDL, LDLCALC, TRIG, CHOLHDL, LDLDIRECT in the last 72 hours. Thyroid Function Tests: No results for input(s): TSH, T4TOTAL, FREET4, T3FREE, THYROIDAB in the last 72 hours. Anemia Panel: Recent Labs    12/31/17 0600  VITAMINB12 963*  FOLATE 33.5  FERRITIN 94  TIBC 380  IRON 59  RETICCTPCT 1.9   Sepsis Labs: Recent Labs  Lab 12/26/17 1025 12/26/17 1300 12/26/17 1947 12/27/17 0455 12/28/17 0534  PROCALCITON  --   --  0.10 <0.10 <0.10  LATICACIDVEN 2.08* 1.44  --   --   --     Recent Results  (from the past 240 hour(s))  Culture, blood (Routine x 2)     Status: None   Collection Time: 12/26/17 10:15 AM  Result Value Ref Range Status   Specimen Description   Final    BLOOD BLOOD RIGHT FOREARM Performed at [facility], [address] [place], [place] [address]    Special Requests   Final    BOTTLES DRAWN AEROBIC AND ANAEROBIC Blood Culture adequate volume Performed at [facility], [address] [place], [place] [address]    Culture   Final    NO GROWTH 5 DAYS Performed at [facility], [address], [place],  [place] [address]    Report Status 12/31/2017 FINAL  Final  Urine culture     Status: None   Collection Time: 12/26/17 10:15 AM  Result Value Ref Range Status   Specimen Description   Final    URINE, RANDOM Performed at [facility], [address] [place], [place] [address]    Special Requests   Final    NONE Performed at [facility], [address] [place], [place] [address]    Culture   Final    NO GROWTH Performed at [facility], [address] [place], [place] [address]    Report Status 12/27/2017 FINAL  Final  Culture, blood (Routine x 2)     Status: None   Collection Time: 12/26/17 10:18 AM  Result Value Ref Range Status   Specimen Description   Final    BLOOD RIGHT ANTECUBITAL Performed at [facility], [address] [place], [place] [address]    Special Requests   Final    BOTTLES DRAWN AEROBIC AND ANAEROBIC Blood Culture results may not be optimal due to an excessive volume of blood received in culture bottles   Culture   Final    NO GROWTH 5 DAYS Performed at [facility], [address] [place], [place] [address]    Report Status 12/31/2017 FINAL  Final  MRSA PCR Screening     Status: None   Collection Time: 12/26/17  8:11 PM  Result Value Ref Range Status   MRSA by PCR NEGATIVE NEGATIVE Final    Comment:        The GeneXpert  MRSA Assay (FDA approved for NASAL specimens only), is one component of a comprehensive MRSA colonization surveillance program. It is not intended to diagnose MRSA infection nor to guide or monitor treatment for MRSA infections. Performed at [facility], [address] [place], [place] [address]   Respiratory Panel by PCR     Status: None   Collection Time: 12/29/17  7:34 AM  Result Value Ref Range Status   Adenovirus NOT DETECTED NOT DETECTED Final   Coronavirus 229E NOT DETECTED NOT DETECTED Final   Coronavirus HKU1 NOT DETECTED NOT DETECTED Final   Coronavirus NL63 NOT DETECTED NOT DETECTED Final   Coronavirus OC43 NOT DETECTED NOT DETECTED Final   Metapneumovirus NOT DETECTED NOT DETECTED Final   Rhinovirus / Enterovirus NOT DETECTED NOT DETECTED Final   Influenza A NOT DETECTED NOT DETECTED Final   Influenza B NOT DETECTED NOT DETECTED Final   Parainfluenza Virus 1 NOT DETECTED NOT DETECTED Final   Parainfluenza Virus 2 NOT DETECTED NOT DETECTED Final   Parainfluenza Virus 3 NOT DETECTED NOT DETECTED Final   Parainfluenza Virus 4 NOT DETECTED NOT DETECTED Final   Respiratory Syncytial Virus NOT DETECTED NOT DETECTED Final   Bordetella pertussis NOT DETECTED NOT DETECTED Final   Chlamydophila pneumoniae NOT DETECTED NOT DETECTED Final   Mycoplasma pneumoniae NOT DETECTED NOT DETECTED Final    Comment: Performed at [facility], [address] [place], [place] [address]         Radiology Studies: No results found.      Scheduled Meds: . benzonatate  200 mg Oral TID  . divalproex  125 mg Oral TID  . doxycycline  100 mg Oral Q12H  . enoxaparin (LOVENOX) injection  40 mg Subcutaneous Q24H  . feeding supplement (ENSURE ENLIVE)  1 Bottle Oral TID WC  . guaiFENesin  600 mg Oral BID  . ipratropium-albuterol  3 mL Nebulization TID  . levothyroxine  75 mcg Oral QAC breakfast  . mouth rinse  15 mL Mouth Rinse BID  . Melatonin  3 mg Oral QHS    . multivitamin with minerals  1 tablet Oral Daily  . rivastigmine  1.5 mg Oral BID  . senna  17.2 mg Oral BID   Continuous Infusions:   LOS: 1 day    Time spent: 25 mins     Burnadette Pop, MD [facility] Pager [phone]  If 7PM-7AM, please contact night-coverage [email or web address] Password [email or web address] 12/31/2017, 2:00 PM

## 2018-01-01 LAB — CBC WITH DIFFERENTIAL/PLATELET
BASOS ABS: 0 10*3/uL (ref 0.0–0.1)
BASOS PCT: 0 %
EOS ABS: 0 10*3/uL (ref 0.0–0.7)
EOS PCT: 0 %
HEMATOCRIT: 30.8 % — AB (ref 36.0–46.0)
Hemoglobin: 10.3 g/dL — ABNORMAL LOW (ref 12.0–15.0)
Lymphocytes Relative: 12 %
Lymphs Abs: 1.6 10*3/uL (ref 0.7–4.0)
MCH: 31 pg (ref 26.0–34.0)
MCHC: 33.4 g/dL (ref 30.0–36.0)
MCV: 92.8 fL (ref 78.0–100.0)
MONO ABS: 0.9 10*3/uL (ref 0.1–1.0)
MONOS PCT: 7 %
NEUTROS ABS: 11.1 10*3/uL — AB (ref 1.7–7.7)
Neutrophils Relative %: 81 %
PLATELETS: 194 10*3/uL (ref 150–400)
RBC: 3.32 MIL/uL — ABNORMAL LOW (ref 3.87–5.11)
RDW: 14.2 % (ref 11.5–15.5)
WBC: 13.6 10*3/uL — ABNORMAL HIGH (ref 4.0–10.5)

## 2018-01-01 LAB — BASIC METABOLIC PANEL
ANION GAP: 9 (ref 5–15)
BUN: 42 mg/dL — ABNORMAL HIGH (ref 8–23)
CO2: 30 mmol/L (ref 22–32)
Calcium: 9.4 mg/dL (ref 8.9–10.3)
Chloride: 104 mmol/L (ref 98–111)
Creatinine, Ser: 0.81 mg/dL (ref 0.44–1.00)
GFR calc non Af Amer: 60 mL/min — ABNORMAL LOW (ref >60)
GLUCOSE: 114 mg/dL — AB (ref 70–99)
Potassium: 4.4 mmol/L (ref 3.5–5.1)
SODIUM: 143 mmol/L (ref 135–145)

## 2018-01-01 MED ORDER — IPRATROPIUM-ALBUTEROL 0.5-2.5 (3) MG/3ML IN SOLN
3.0000 mL | Freq: Two times a day (BID) | RESPIRATORY_TRACT | Status: DC
  Administered 2018-01-01 – 2018-01-02 (×2): 3 mL via RESPIRATORY_TRACT
  Filled 2018-01-01 (×2): qty 3

## 2018-01-01 NOTE — Progress Notes (Signed)
Restraints removed per Night RN at 0600.

## 2018-01-01 NOTE — Progress Notes (Signed)
PROGRESS NOTE    [name]  KYH:[id number] DOB: Sep 14, 1920 DOA: 12/26/2017 PCP: Ron [place], MD   Brief Narrative: Patient is a 84 year old female from [facility] with past medical history of recurrent UTI, hypothyroidism, dementia with behavioral/mood problems, depression who was presented with altered mental status, fever and elevated white cell counts.  Patient was recently admitted and discharged with  Aerococcus urinae UTI on 12/01/17.  Patient's hospital course was remarkable for agitation, confusion.  Currently her overall status is stable.  She is stable for discharge to skilled nursing facility which will most likely happen tomorrow.  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  Agitation was most likely contributed by her underlying dementia on metabolic encephalopathy secondary to viral pneumonia . Patient is a resident of memory care center. She was on steroids which could also cause confusion, agitation.  Steroids discontinued .  Viral pneumonia: Chest x-ray showed opacity.  Procalcitonin was negative.  Blood cultures have not shown any growth.  Started on doxycycline.  She also has mild leukocytosis.  Dementia with behavioral problems: On rivastigmine, risperidone, clonazepam, divalproex and trazodone.  Significantly agitated this morning.  Had to be put on restraints.  Hypothyroidism: Continue levothyroxine.  Chronic diarrhea: Continue PRN loperamide.  Hyponatremia: Currently sodium level stable.   Normocytic  Anemia: Secondary to chronic disease.  Patient is currently stable.  Disposition: Patient will be discharged to [facility] tomorrow   DVT prophylaxis:  Lovenox Code Status: DNR Family Communication: None present at the bedside Disposition Plan: Back to memory care center tomorrow   Consultants: None  Procedures: None  Antimicrobials: Doxycycline  Subjective: Patient seen and examined at bedside this morning.  Remains calm and cooperative today.  No active issues.  Objective: Vitals:   12/31/17 1951 12/31/17 2008 01/01/18 0500 01/01/18 0942  BP:  (!) 160/62 (!) 144/60   Pulse:  62 (!) 50   Resp:  20 19   Temp:  97.6 F (36.4 C) (!) 97.5 F (36.4 C)   TempSrc:      SpO2: 97% 93% 97% 95%  Weight:      Height:        Intake/Output Summary (Last 24 hours) at 01/01/2018 1313 Last data filed at 12/31/2017 1849 Gross per 24 hour  Intake 243.83 ml  Output 1400 ml  Net -1156.17 ml   Filed Weights   12/26/17 1013 12/26/17 1849  Weight: 86.2 kg (190 lb) 81.7 kg (180 lb 1.9 oz)    Examination:  General exam: Calm and comfortable HEENT:PERRL,Oral mucosa moist, Ear/Nose normal on gross exam Respiratory system: Bilateral equal air entry, normal vesicular breath sounds, no wheezes or crackles  Cardiovascular system: S1 & S2 heard, RRR. No JVD, murmurs, rubs, gallops or clicks. No pedal edema. Gastrointestinal system: Abdomen is nondistended, soft and nontender. No organomegaly or masses felt. Normal bowel sounds heard. Central nervous system: Alert  but not oriented. No focal neurological deficits. Extremities: No edema, no clubbing ,no cyanosis, distal peripheral pulses palpable. Skin: No rashes, lesions or ulcers,no icterus ,no pallor MSK: Normal muscle bulk,tone ,power Psychiatry: Judgement and insight appear impaired   Data Reviewed: I have personally reviewed following labs and imaging studies  CBC:  Recent Labs  Lab 12/26/17 1018 12/27/17 0455 12/28/17 0534 12/30/17 0822 12/31/17 0600 01/01/18 0602  WBC 25.7* 18.7* 10.2 9.2 13.9* 13.6*  NEUTROABS 23.6*  --   --  7.6 12.7* 11.1*  HGB 11.7* 10.4* 10.0* 10.8* 10.5*  10.3*  HCT 35.1* 32.5* 29.9* 32.3* 30.9* 30.8*  MCV 90.9 94.2 92.0 91.5 89.8 92.8  PLT 197 165 161 161 176 194   Basic Metabolic Panel: Recent Labs  Lab 12/26/17 1018 12/27/17 0455 12/29/17 0554 12/30/17 0822 12/30/17 1555 12/31/17 0600 01/01/18 0602  NA 141 143  --  150* 143  --  143  K 4.9 4.4  --  4.6 4.4  --  4.4  CL 106 109  --  109 105  --  104  CO2 29 24  --  30 26  --  30  GLUCOSE 125* 87  --  143* 176*  --  114*  BUN 28* 19  --  32* 38*  --  42*  CREATININE 1.00 0.96 0.90 0.96 1.01*  --  0.81  CALCIUM 9.1 8.9  --  9.6 9.3  --  9.4  MG  --   --   --  2.1  --  2.1  --   PHOS  --   --   --  4.4  --  3.9  --    GFR: Estimated Creatinine Clearance: 44.8 mL/min (by C-G formula based on SCr of 0.81 mg/dL). Liver Function Tests: Recent Labs  Lab 12/26/17 1018 12/30/17 0822 12/30/17 1555  AST 24 16 15   ALT 24 20 19   ALKPHOS 83 81 74  BILITOT 0.5 0.4 0.3  PROT 6.6 6.8 6.7  ALBUMIN 3.1* 3.0* 3.0*   No results for input(s): LIPASE, AMYLASE in the last 168 hours. No results for input(s): AMMONIA in the last 168 hours. Coagulation Profile: Recent Labs  Lab 12/26/17 1018  INR 0.89   Cardiac Enzymes: No results for input(s): CKTOTAL, CKMB, CKMBINDEX, TROPONINI in the last 168 hours. BNP (last 3 results) No results for input(s): PROBNP in the last 8760 hours. HbA1C: No results for input(s): HGBA1C in the last 72 hours. CBG: Recent Labs  Lab 12/26/17 1732  GLUCAP 109*   Lipid Profile: No results for input(s): CHOL, HDL, LDLCALC, TRIG, CHOLHDL, LDLDIRECT in the last 72 hours. Thyroid Function Tests: No results for input(s): TSH, T4TOTAL, FREET4, T3FREE, THYROIDAB in the last 72 hours. Anemia Panel: Recent Labs    12/31/17 0600  VITAMINB12 963*  FOLATE 33.5  FERRITIN 94  TIBC 380  IRON 59  RETICCTPCT 1.9   Sepsis Labs: Recent Labs  Lab 12/26/17 1025 12/26/17 1300 12/26/17 1947 12/27/17 0455 12/28/17 0534  PROCALCITON  --   --  0.10 <0.10 <0.10   LATICACIDVEN 2.08* 1.44  --   --   --     Recent Results (from the past 240 hour(s))  Culture, blood (Routine x 2)     Status: None   Collection Time: 12/26/17 10:15 AM  Result Value Ref Range Status   Specimen Description   Final    BLOOD BLOOD RIGHT FOREARM Performed at [facility], [address], [place], [place] [address]    Special Requests   Final    BOTTLES DRAWN AEROBIC AND ANAEROBIC Blood Culture adequate volume Performed at [facility], [address] [place], [place] [address]    Culture   Final    NO GROWTH 5 DAYS Performed at [facility], [address] [place], [place] [address]    Report Status 12/31/2017 FINAL  Final  Urine culture     Status: None   Collection Time: 12/26/17 10:15 AM  Result Value Ref Range Status   Specimen Description   Final    URINE, RANDOM Performed at [facility], [address], [place], [place] [address]    Special Requests   Final    NONE Performed at [facility], [address], [place], [place] [address]    Culture   Final    NO GROWTH Performed at [facility], [address] [place], [place] [address]    Report Status 12/27/2017 FINAL  Final  Culture, blood (Routine x 2)     Status: None   Collection Time: 12/26/17 10:18 AM  Result Value Ref Range Status   Specimen Description   Final    BLOOD RIGHT ANTECUBITAL Performed at [facility], [address] [place], [place] [address]    Special Requests   Final    BOTTLES DRAWN AEROBIC AND ANAEROBIC Blood Culture results may not be optimal due to an excessive volume of blood received in culture bottles   Culture   Final    NO GROWTH 5 DAYS Performed at [facility], [address], [place], [place] [address]    Report Status 12/31/2017 FINAL  Final  MRSA PCR Screening     Status: None   Collection Time: 12/26/17  8:11 PM  Result Value Ref Range Status   MRSA by  PCR NEGATIVE NEGATIVE Final    Comment:        The GeneXpert MRSA Assay (FDA approved for NASAL specimens only), is one component of a comprehensive MRSA colonization surveillance program. It is not intended to diagnose MRSA infection nor to guide or monitor treatment for MRSA infections. Performed at [facility], [address], [place], [place] [address]   Respiratory Panel by PCR     Status: None   Collection Time: 12/29/17  7:34 AM  Result Value Ref Range Status   Adenovirus NOT DETECTED NOT DETECTED Final   Coronavirus 229E NOT DETECTED NOT DETECTED Final   Coronavirus HKU1 NOT DETECTED NOT DETECTED Final   Coronavirus NL63 NOT DETECTED NOT DETECTED Final   Coronavirus OC43 NOT DETECTED NOT DETECTED Final   Metapneumovirus NOT DETECTED NOT DETECTED Final   Rhinovirus / Enterovirus NOT DETECTED NOT DETECTED Final   Influenza A NOT DETECTED NOT DETECTED Final   Influenza B NOT DETECTED NOT DETECTED Final   Parainfluenza Virus 1 NOT DETECTED NOT DETECTED Final   Parainfluenza Virus 2 NOT DETECTED NOT DETECTED Final   Parainfluenza Virus 3 NOT DETECTED NOT DETECTED Final   Parainfluenza Virus 4 NOT DETECTED NOT DETECTED Final   Respiratory Syncytial Virus NOT DETECTED NOT DETECTED Final   Bordetella pertussis NOT DETECTED NOT DETECTED Final   Chlamydophila pneumoniae NOT DETECTED NOT DETECTED Final   Mycoplasma pneumoniae NOT DETECTED NOT DETECTED Final    Comment: Performed at [facility], [address], [place], [place] [address]         Radiology Studies: No results found.      Scheduled Meds: . benzonatate  200 mg Oral TID  . divalproex  125 mg Oral TID  . doxycycline  100 mg Oral Q12H  . enoxaparin (LOVENOX) injection  40 mg Subcutaneous Q24H  . feeding supplement (ENSURE ENLIVE)  1 Bottle Oral TID WC  .  guaiFENesin  600 mg Oral BID  . ipratropium-albuterol  3 mL Nebulization BID  . levothyroxine  75 mcg Oral QAC breakfast  .  mouth rinse  15 mL Mouth Rinse BID  . Melatonin  3 mg Oral QHS  . multivitamin with minerals  1 tablet Oral Daily  . rivastigmine  1.5 mg Oral BID  . senna  17.2 mg Oral BID   Continuous Infusions:   LOS: 2 days    Time spent: 25 mins     Burnadette Pop, MD [facility] Pager [phone]  If 7PM-7AM, please contact night-coverage [email or web address] Password [email or web address] 01/01/2018, 1:13 PM

## 2018-01-02 MED ORDER — DOXYCYCLINE HYCLATE 100 MG PO TABS: 100.0000 mg | ORAL_TABLET | Freq: Two times a day (BID) | ORAL | 0 refills | Status: AC

## 2018-01-02 NOTE — Progress Notes (Signed)
[facility]. Facility aware of patient's discharge and staff member Esther HardyChantae Brown confirmed patient's ability to return. CSW sent requested clinical documents. PTAR contacted, patient's son [name]([name] Blake [phone]) notified. Patient's RN can call report to [phone], packet complete.  CSW signing off, no other needs identified at this time.  Celso SickleKimberly Amiracle Neises, [place] Clinical Social Worker [facility] Cell#: [phone]

## 2018-01-02 NOTE — Progress Notes (Signed)
EMS here to transport patient back to facility. Report was given to RN at the facility, and she was familiar with patient. Reported that patient had two skin tears on right hand/arm covered with transparent dressing. Some MASD around groin. No pressure ulcers. She had just been cleaned and changed. Mesh panties with depends placed for incontinence. Patient is lethargic this afternoon, but wakes easily and is calm and compliant. VS stable. No issues or concerns. AVS printed and given to transporter.

## 2018-01-02 NOTE — [place] FL2 (Signed)
[place] MEDICAID FL2 LEVEL OF CARE SCREENING TOOL     IDENTIFICATION  Patient Name: [name] Birthdate: [birth year] Sex: female Admission Date (Current Location): 12/26/2017  [facility] and [place] Number:  Producer, television/film/video and Address:  [facility],  [address], [place] [address]      Provider Number: [id number]  Attending Physician Name and Address:  Burnadette Pop, MD  Relative Name and Phone Number:       Current Level of Care: Hospital Recommended Level of Care: Memory Care, Assisted Living Facility Prior Approval Number:    Date Approved/Denied:   PASRR Number:    Discharge Plan: Other (Comment)(Memory Care unit)    Current Diagnoses: Patient Active Problem List   Diagnosis Date Noted  . Abdominal tenderness 11/28/2017  . Intertriginous candidiasis   . Depression 11/27/2017  . Rash 11/27/2017  . Tibial fracture 09/16/2016  . Left fibular fracture 09/16/2016  . Leukocytosis 09/16/2016  . Pressure ulcer 09/20/2015  . Displaced fracture of right femoral neck (HCC) 09/19/2015  . Altered mental status   . Confusion 06/26/2015  . Fall   . Aspiration pneumonia (HCC) 01/10/2015  . Acute metabolic encephalopathy 01/08/2015  . UTI (urinary tract infection) 01/08/2015  . 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 Recurrent falls 11/27/2014  . FTT (failure to thrive) in adult 10/26/2014  . Dehydration 10/26/2014  . Abnormal urinalysis 10/26/2014  . Normocytic anemia 10/26/2014  . Lump of skin of left upper extremity 09/07/2014  . Rash and nonspecific skin eruption 05/08/2014  . Syncope and collapse 11/30/2013  . Low back pain, episodic 11/20/2013  . Chronic kidney disease   . Insomnia 08/31/2013  . Arthritis 10/02/2012  . Valvular heart disease 09/04/2012  . Restless leg syndrome 06/06/2012  . Trochanteric bursitis 08/02/2011  . Dementia 08/02/2011  . Leg pain 03/28/2011  . Hypertension 09/22/2010  .  Hypothyroidism 09/22/2010    Orientation RESPIRATION BLADDER Height & Weight     Self  Normal Incontinent Weight: 186 lb (84.4 kg) Height:  5\' 6"  (167.6 cm)  BEHAVIORAL SYMPTOMS/MOOD NEUROLOGICAL BOWEL NUTRITION STATUS      Continent Diet(soft)  AMBULATORY STATUS COMMUNICATION OF NEEDS Skin   Extensive Assist Verbally Normal                       Personal Care Assistance Level of Assistance  Bathing, Feeding, Dressing Bathing Assistance: Maximum assistance Feeding assistance: Limited assistance Dressing Assistance: Maximum assistance     Functional Limitations Info  Sight, Hearing, Speech Sight Info: Impaired Hearing Info: Adequate Speech Info: Adequate    SPECIAL CARE FACTORS FREQUENCY                       Contractures Contractures Info: Not present    Additional Factors Info  Code Status, Allergies Code Status Info: DNR Allergies Info: Morphine And Related;Codeine;Hydrocodone             Discharge Medications:  Medication List    STOP taking these medications   amoxicillin 500 MG tablet Commonly known as:  AMOXIL     TAKE these medications   acetaminophen 500 MG tablet Commonly known as:  TYLENOL Take 500 mg by mouth every 4 (four) hours as needed for mild pain, moderate pain, fever or headache.   aspirin 81 MG chewable tablet Chew 81 mg daily by mouth.   clonazePAM 0.5 MG tablet Commonly known as:  KLONOPIN Take 0.5 tablets (  0.25 mg total) by mouth 2 (two) times daily.   divalproex 125 MG capsule Commonly known as:  DEPAKOTE SPRINKLE Take 1 capsule (125 mg total) by mouth 2 (two) times daily. What changed:  when to take this   doxycycline 100 MG tablet Commonly known as:  VIBRA-TABS Take 1 tablet (100 mg total) by mouth every 12 (twelve) hours for 2 days. Start taking on:  01/03/2018   guaifenesin 100 MG/5ML syrup Commonly known as:  ROBITUSSIN Take 200 mg by mouth every 6 (six) hours as needed for cough.    levothyroxine 75 MCG tablet Commonly known as:  SYNTHROID, LEVOTHROID Take 1 tablet (75 mcg total) by mouth daily before breakfast.   loperamide 2 MG capsule Commonly known as:  IMODIUM Take 2 mg by mouth as needed for diarrhea or loose stools.   magnesium hydroxide 400 MG/5ML suspension Commonly known as:  MILK OF MAGNESIA Take 30 mLs by mouth daily as needed for mild constipation.   Melatonin 3 MG Tabs Take 3 mg by mouth at bedtime.   multivitamin with minerals tablet Take 1 tablet daily with breakfast by mouth. "Akorn/s antioxidant"   NUTRITIONAL DRINK Liqd Take 1 Bottle by mouth 3 (three) times daily with meals. *Mighty Shakes*   risperiDONE 0.5 MG tablet Commonly known as:  RISPERDAL Take 0.5 mg daily as needed by mouth (severe agitation/anxiety).   rivastigmine 1.5 MG capsule Commonly known as:  EXELON Take 1.5 mg by mouth 2 (two) times daily.   senna 8.6 MG Tabs tablet Commonly known as:  SENOKOT Take 17.2 mg by mouth 2 (two) times daily.   traZODone 50 MG tablet Commonly known as:  DESYREL Take 1 tablet (50 mg total) by mouth at bedtime as needed for sleep.   TRIPLE ANTIBIOTIC [phone] Oint Apply 1 application 3 (three) times daily as needed topically (skin tears/abrasians).         Relevant Imaging Results:  Relevant Lab Results:   Additional Information SS#: [id number]  Antionette Poles, LCSW

## 2018-01-02 NOTE — Discharge Summary (Signed)
Physician Discharge Summary  [name] ZOX:[id number] DOB: [birth year] DOA: 12/26/2017  PCP: Ron Parker, MD  Admit date: 12/26/2017 Discharge date: 01/02/2018  Admitted From: Home Disposition:  Home  Discharge Condition:Stable CODE STATUS: DNR Diet recommendation: Soft diet  Brief/Interim Summary:  Patient is a 84 year old female from [facility] with past medical history of recurrent UTI, hypothyroidism, dementia with behavioral/mood problems, depression who was presented with altered mental status, fever and elevated white cell counts.  Patient was recently admitted and discharged with  Aerococcus urinae UTI on 12/01/17.    She was suspected to have viral pneumonia on presentation.  Currently her respiratory status is stable. Patient's hospital course was remarkable for agitation, confusion. She is stable for discharge to skilled nursing facility today.  Following problems were addressed during her hospitalization:   Altered mental status/Agitation: Resolved.S  Agitation was most likely contributed by her underlying dementia on metabolic encephalopathy secondary to viral pneumonia . Patient is a resident of memory care center. She was on steroids which could also cause confusion, agitation.  Steroids discontinued .  Viral pneumonia: Chest x-ray showed possible opacity.  Procalcitonin was negative.  Blood cultures have not shown any growth.  Started on doxycycline.  She also has mild leukocytosis.  Dementia with behavioral problems: On rivastigmine, risperidone, clonazepam, divalproex and trazodone. Continue this meds at homw.  Hypothyroidism: Continue levothyroxine.  Chronic diarrhea: Continue PRN loperamide.  Hypernatremia: Currently sodium level stable.   Normocytic  Anemia: Secondary to chronic disease.  Patient is currently stable.  Disposition: Patient will be discharged back to the facility today.   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  Do a CBC and BMP test during the follow-up. 2)Take prescribed medications as instructed.   Increase activity slowly   Complete by:  As directed      Allergies as of 01/02/2018      Reactions   Morphine And Related Other (See Comments)   Unknown reaction   Codeine Rash   Hydrocodone Rash      Medication List    STOP taking these medications   amoxicillin 500 MG tablet Commonly known as:  AMOXIL     TAKE these medications   acetaminophen 500 MG tablet Commonly known as:  TYLENOL Take 500 mg by mouth every 4 (four) hours as needed for mild pain, moderate pain, fever or headache.   aspirin 81 MG chewable tablet Chew 81 mg daily by mouth.   clonazePAM 0.5 MG tablet Commonly known as:  KLONOPIN Take 0.5 tablets (0.25 mg total) by mouth 2 (two) times daily.   divalproex 125 MG capsule Commonly known as:  DEPAKOTE SPRINKLE Take 1 capsule (125 mg total) by mouth 2 (two) times daily. What changed:  when to take this   doxycycline 100 MG tablet Commonly known as:  VIBRA-TABS Take 1 tablet (100 mg total) by mouth every 12 (twelve) hours for 2 days. Start taking on:  01/03/2018   guaifenesin 100 MG/5ML syrup  Commonly known as:  ROBITUSSIN Take 200 mg by mouth every 6 (six) hours as needed for cough.   levothyroxine 75 MCG tablet Commonly known as:  SYNTHROID, LEVOTHROID Take 1 tablet (75 mcg total) by mouth daily before breakfast.   loperamide 2 MG capsule Commonly known as:  IMODIUM Take 2 mg by mouth as needed for diarrhea or loose stools.    magnesium hydroxide 400 MG/5ML suspension Commonly known as:  MILK OF MAGNESIA Take 30 mLs by mouth daily as needed for mild constipation.   Melatonin 3 MG Tabs Take 3 mg by mouth at bedtime.   multivitamin with minerals tablet Take 1 tablet daily with breakfast by mouth. "Akorn/s antioxidant"   NUTRITIONAL DRINK Liqd Take 1 Bottle by mouth 3 (three) times daily with meals. *Mighty Shakes*   risperiDONE 0.5 MG tablet Commonly known as:  RISPERDAL Take 0.5 mg daily as needed by mouth (severe agitation/anxiety).   rivastigmine 1.5 MG capsule Commonly known as:  EXELON Take 1.5 mg by mouth 2 (two) times daily.   senna 8.6 MG Tabs tablet Commonly known as:  SENOKOT Take 17.2 mg by mouth 2 (two) times daily.   traZODone 50 MG tablet Commonly known as:  DESYREL Take 1 tablet (50 mg total) by mouth at bedtime as needed for sleep.   TRIPLE ANTIBIOTIC [phone] Oint Apply 1 application 3 (three) times daily as needed topically (skin tears/abrasians).      Follow-up Information    Ron ParkerBowen, [name], MD. Schedule an appointment as soon as possible for a visit in 1 week(s).   Specialty:  Internal Medicine Contact information: [address] [place] [id number] [phone]          Allergies  Allergen Reactions  . Morphine And Related Other (See Comments)    Unknown reaction  . Codeine Rash  . Hydrocodone Rash    Consultations: None  Procedures/Studies: Dg Chest 1 View  Result Date: 12/29/2017 CLINICAL DATA:  Fever, cough, wheezing. Former smoker. History of asthma. EXAM: CHEST  1 VIEW COMPARISON:  Portable chest x-ray of December 27, 2017 FINDINGS: The lungs are adequately inflated. There is no focal infiltrate. Subtle increased interstitial markings are noted in the right infrahilar region. There is no pleural effusion. The cardiac silhouette is mildly enlarged. The pulmonary vascularity is normal. The mediastinum is normal in width. There is calcification in the  wall of the aortic arch. There is mild multilevel degenerative disc disease with endplate spurring. There are degenerative changes of both shoulders. IMPRESSION: No alveolar pneumonia. Subsegmental atelectasis or early infiltrate in the right infrahilar region. When the patient can tolerate the procedure, a PA and lateral chest x-ray would be useful. Thoracic aortic atherosclerosis. Electronically Signed   By: David  Swazil[place]Jordan M.D.   On: 12/29/2017 08:24   Dg Chest 1 View  Result Date: 12/27/2017 CLINICAL DATA:  Fever EXAM: CHEST  1 VIEW COMPARISON:  12/26/2017 and prior chest radiographs FINDINGS: Equivocal LEFT LOWER lung retrocardiac density noted which may represent atelectasis or airspace disease. Minimal RIGHT basilar atelectasis/scarring noted. Cardiomegaly identified. No pneumothorax or pleural effusion. No acute bony abnormalities identified. Degenerative changes in the RIGHT shoulder again noted. IMPRESSION: Equivocal LEFT LOWER lung retrocardiac density which may represent atelectasis or possibly airspace disease. Cardiomegaly. Electronically Signed   By: Harmon PierJeffrey  Hu M.D.   On: 12/27/2017 11:19   Dg Chest 2 View  Result Date: 12/26/2017 CLINICAL DATA:  Increasing fevers EXAM: CHEST - 2 VIEW COMPARISON:  11/27/2017 FINDINGS: Cardiac shadows within normal  limits. Lungs are hyperinflated without focal infiltrate or sizable effusion. Degenerative change of the thoracic spine is seen. IMPRESSION: No acute abnormality noted. Electronically Signed   By: Alcide Clever M.D.   On: 12/26/2017 10:42       Subjective: Patient seen and examined the pressure this morning.  Remained comfortable during my evaluation.  She was reported to be agitated last night.  Currently she is calm and cooperative.  She is hemodynamically stable.  She is stable for discharge back to facility.  Discharge Exam: Vitals:   01/02/18 0515 01/02/18 0730  BP: (!) 143/78   Pulse: (!) 59   Resp: 20   Temp: 98.3 F (36.8 C)    SpO2: 95% 95%   Vitals:   01/01/18 2022 01/01/18 2157 01/02/18 0515 01/02/18 0730  BP: (!) 157/80  (!) 143/78   Pulse: 69  (!) 59   Resp: 18  20   Temp: 98.5 F (36.9 C)  98.3 F (36.8 C)   TempSrc: Oral     SpO2: 91% 92% 95% 95%  Weight:   84.4 kg (186 lb)   Height:        General: Pt is alert, awake, not in acute distress Cardiovascular: RRR, S1/S2 +, no rubs, no gallops Respiratory: CTA bilaterally, no wheezing, no rhonchi Abdominal: Soft, NT, ND, bowel sounds + Extremities: no edema, no cyanosis    The results of significant diagnostics from this hospitalization (including imaging, microbiology, ancillary and laboratory) are listed below for reference.     Microbiology: Recent Results (from the past 240 hour(s))  Culture, blood (Routine x 2)     Status: None   Collection Time: 12/26/17 10:15 AM  Result Value Ref Range Status   Specimen Description   Final    BLOOD BLOOD RIGHT FOREARM Performed at [facility], [address] [place], [place] [address]    Special Requests   Final    BOTTLES DRAWN AEROBIC AND ANAEROBIC Blood Culture adequate volume Performed at [facility], [address] [place], [place] [address]    Culture   Final    NO GROWTH 5 DAYS Performed at [facility], [address] [place], [place] [address]    Report Status 12/31/2017 FINAL  Final  Urine culture     Status: None   Collection Time: 12/26/17 10:15 AM  Result Value Ref Range Status   Specimen Description   Final    URINE, RANDOM Performed at [facility], [address], [place], [place] [address]    Special Requests   Final    NONE Performed at [facility], [address] [place], [place] [address]    Culture   Final    NO GROWTH Performed at [facility], [address], [place], [place] [address]    Report Status 12/27/2017 FINAL  Final  Culture, blood (Routine x 2)     Status: None    Collection Time: 12/26/17 10:18 AM  Result Value Ref Range Status   Specimen Description   Final    BLOOD RIGHT ANTECUBITAL Performed at [facility], [address], [place], [place] [address]    Special Requests   Final    BOTTLES DRAWN AEROBIC AND ANAEROBIC Blood Culture results may not be optimal due to an excessive volume of blood received in culture bottles   Culture   Final    NO GROWTH 5 DAYS Performed at [facility], [address], [place], [place] [address]    Report Status 12/31/2017 FINAL  Final  MRSA PCR Screening     Status: None   Collection Time: 12/26/17  8:11 PM  Result Value Ref Range Status   MRSA by PCR NEGATIVE NEGATIVE Final    Comment:        The GeneXpert MRSA Assay (FDA approved for NASAL specimens only), is one component of a comprehensive MRSA colonization surveillance program. It is not intended to diagnose MRSA infection nor to guide or monitor treatment for MRSA infections. Performed at [facility], [address], [place], [place] [address]   Respiratory Panel by PCR     Status: None   Collection Time: 12/29/17  7:34 AM  Result Value Ref Range Status   Adenovirus NOT DETECTED NOT DETECTED Final   Coronavirus 229E NOT DETECTED NOT DETECTED Final   Coronavirus HKU1 NOT DETECTED NOT DETECTED Final   Coronavirus NL63 NOT DETECTED NOT DETECTED Final   Coronavirus OC43 NOT DETECTED NOT DETECTED Final   Metapneumovirus NOT DETECTED NOT DETECTED Final   Rhinovirus / Enterovirus NOT DETECTED NOT DETECTED Final   Influenza A NOT DETECTED NOT DETECTED Final   Influenza B NOT DETECTED NOT DETECTED Final   Parainfluenza Virus 1 NOT DETECTED NOT DETECTED Final   Parainfluenza Virus 2 NOT DETECTED NOT DETECTED Final   Parainfluenza Virus 3 NOT DETECTED NOT DETECTED Final   Parainfluenza Virus 4 NOT DETECTED NOT DETECTED Final   Respiratory Syncytial Virus NOT DETECTED NOT DETECTED Final   Bordetella  pertussis NOT DETECTED NOT DETECTED Final   Chlamydophila pneumoniae NOT DETECTED NOT DETECTED Final   Mycoplasma pneumoniae NOT DETECTED NOT DETECTED Final    Comment: Performed at [facility], [address], [place], [place] [address]     Labs: BNP (last 3 results) No results for input(s): BNP in the last 8760 hours. Basic Metabolic Panel: Recent Labs  Lab 12/27/17 0455 12/29/17 0554 12/30/17 0822 12/30/17 1555 12/31/17 0600 01/01/18 0602  NA 143  --  150* 143  --  143  K 4.4  --  4.6 4.4  --  4.4  CL 109  --  109 105  --  104  CO2 24  --  30 26  --  30  GLUCOSE 87  --  143* 176*  --  114*  BUN 19  --  32* 38*  --  42*  CREATININE 0.96 0.90 0.96 1.01*  --  0.81  CALCIUM 8.9  --  9.6 9.3  --  9.4  MG  --   --  2.1  --  2.1  --   PHOS  --   --  4.4  --  3.9  --    Liver Function Tests: Recent Labs  Lab 12/30/17 0822 12/30/17 1555  AST 16 15  ALT 20 19  ALKPHOS 81 74  BILITOT 0.4 0.3  PROT 6.8 6.7  ALBUMIN 3.0* 3.0*   No results for input(s): LIPASE, AMYLASE in the last 168 hours. No results for input(s): AMMONIA in the last 168 hours. CBC: Recent Labs  Lab 12/27/17 0455 12/28/17 0534 12/30/17 0822 12/31/17 0600 01/01/18 0602  WBC 18.7* 10.2 9.2 13.9* 13.6*  NEUTROABS  --   --  7.6 12.7* 11.1*  HGB 10.4* 10.0* 10.8* 10.5* 10.3*  HCT 32.5* 29.9* 32.3* 30.9* 30.8*  MCV 94.2 92.0 91.5 89.8 92.8  PLT 165 161 161 176 194   Cardiac  Enzymes: No results for input(s): CKTOTAL, CKMB, CKMBINDEX, TROPONINI in the last 168 hours. BNP: Invalid input(s): POCBNP CBG: Recent Labs  Lab 12/26/17 1732  GLUCAP 109*   D-Dimer No results for input(s): DDIMER in the last 72 hours. Hgb A1c No results for input(s): HGBA1C in the last 72 hours. Lipid Profile No results for input(s): CHOL, HDL, LDLCALC, TRIG, CHOLHDL, LDLDIRECT in the last 72 hours. Thyroid function studies No results for input(s): TSH, T4TOTAL, T3FREE, THYROIDAB in the last 72 hours.  Invalid  input(s): FREET3 Anemia work up Recent Labs    12/31/17 0600  VITAMINB12 963*  FOLATE 33.5  FERRITIN 94  TIBC 380  IRON 59  RETICCTPCT 1.9   Urinalysis    Component Value Date/Time   COLORURINE YELLOW 12/26/2017 1015   APPEARANCEUR CLEAR 12/26/2017 1015   LABSPEC 1.017 12/26/2017 1015   PHURINE 7.0 12/26/2017 1015   GLUCOSEU NEGATIVE 12/26/2017 1015   GLUCOSEU NEGATIVE 04/06/2014 1127   HGBUR NEGATIVE 12/26/2017 1015   BILIRUBINUR NEGATIVE 12/26/2017 1015   BILIRUBINUR small 03/14/2014 1151   KETONESUR NEGATIVE 12/26/2017 1015   PROTEINUR NEGATIVE 12/26/2017 1015   UROBILINOGEN 0.2 03/23/2015 0053   NITRITE NEGATIVE 12/26/2017 1015   LEUKOCYTESUR TRACE (A) 12/26/2017 1015   Sepsis Labs Invalid input(s): PROCALCITONIN,  WBC,  LACTICIDVEN Microbiology Recent Results (from the past 240 hour(s))  Culture, blood (Routine x 2)     Status: None   Collection Time: 12/26/17 10:15 AM  Result Value Ref Range Status   Specimen Description   Final    BLOOD BLOOD RIGHT FOREARM Performed at [facility], [address], [place], [place] [address]    Special Requests   Final    BOTTLES DRAWN AEROBIC AND ANAEROBIC Blood Culture adequate volume Performed at [facility], [address] [place], [place] [address]    Culture   Final    NO GROWTH 5 DAYS Performed at [facility], [address] [place], [place] [address]    Report Status 12/31/2017 FINAL  Final  Urine culture     Status: None   Collection Time: 12/26/17 10:15 AM  Result Value Ref Range Status   Specimen Description   Final    URINE, RANDOM Performed at [facility], [address] [place], [place] [address]    Special Requests   Final    NONE Performed at [facility], [address] [place], [place] [address]    Culture   Final    NO GROWTH Performed at [facility], [address], [place], [place] [address]    Report Status  12/27/2017 FINAL  Final  Culture, blood (Routine x 2)     Status: None   Collection Time: 12/26/17 10:18 AM  Result Value Ref Range Status   Specimen Description   Final    BLOOD RIGHT ANTECUBITAL Performed at [facility], [address] [place], [place] [address]    Special Requests   Final    BOTTLES DRAWN AEROBIC AND ANAEROBIC Blood Culture results may not be optimal due to an excessive volume of blood received in culture bottles   Culture   Final    NO GROWTH 5 DAYS Performed at [facility], [address], [place], [place] [address]    Report Status 12/31/2017 FINAL  Final  MRSA PCR Screening     Status: None   Collection Time: 12/26/17  8:11 PM  Result  Value Ref Range Status   MRSA by PCR NEGATIVE NEGATIVE Final    Comment:        The GeneXpert MRSA Assay (FDA approved for NASAL specimens only), is one component of a comprehensive MRSA colonization surveillance program. It is not intended to diagnose MRSA infection nor to guide or monitor treatment for MRSA infections. Performed at [facility], [address], [place], [place] [address]   Respiratory Panel by PCR     Status: None   Collection Time: 12/29/17  7:34 AM  Result Value Ref Range Status   Adenovirus NOT DETECTED NOT DETECTED Final   Coronavirus 229E NOT DETECTED NOT DETECTED Final   Coronavirus HKU1 NOT DETECTED NOT DETECTED Final   Coronavirus NL63 NOT DETECTED NOT DETECTED Final   Coronavirus OC43 NOT DETECTED NOT DETECTED Final   Metapneumovirus NOT DETECTED NOT DETECTED Final   Rhinovirus / Enterovirus NOT DETECTED NOT DETECTED Final   Influenza A NOT DETECTED NOT DETECTED Final   Influenza B NOT DETECTED NOT DETECTED Final   Parainfluenza Virus 1 NOT DETECTED NOT DETECTED Final   Parainfluenza Virus 2 NOT DETECTED NOT DETECTED Final   Parainfluenza Virus 3 NOT DETECTED NOT DETECTED Final   Parainfluenza Virus 4 NOT DETECTED NOT DETECTED Final    Respiratory Syncytial Virus NOT DETECTED NOT DETECTED Final   Bordetella pertussis NOT DETECTED NOT DETECTED Final   Chlamydophila pneumoniae NOT DETECTED NOT DETECTED Final   Mycoplasma pneumoniae NOT DETECTED NOT DETECTED Final    Comment: Performed at [facility], [address], [place], [place] [address]    Please note: You were cared for by a hospitalist during your hospital stay. Once you are discharged, your primary care physician will handle any further medical issues. Please note that NO REFILLS for any discharge medications will be authorized once you are discharged, as it is imperative that you return to your primary care physician (or establish a relationship with a primary care physician if you do not have one) for your post hospital discharge needs so that they can reassess your need for medications and monitor your lab values.    Time coordinating discharge: 40 minutes  SIGNED:   Burnadette Pop, MD  [facility] 01/02/2018, 11:16 AM Pager [phone]  If 7PM-7AM, please contact night-coverage [email or web address] Password [email or web address]

## 2018-01-04 DIAGNOSIS — G0430 Acute necrotizing hemorrhagic encephalopathy, unspecified: Secondary | ICD-10-CM | POA: Diagnosis not present

## 2018-01-05 DIAGNOSIS — G0430 Acute necrotizing hemorrhagic encephalopathy, unspecified: Secondary | ICD-10-CM | POA: Diagnosis not present

## 2018-01-06 DIAGNOSIS — G0430 Acute necrotizing hemorrhagic encephalopathy, unspecified: Secondary | ICD-10-CM | POA: Diagnosis not present

## 2018-01-07 DIAGNOSIS — G0430 Acute necrotizing hemorrhagic encephalopathy, unspecified: Secondary | ICD-10-CM | POA: Diagnosis not present

## 2018-01-08 DIAGNOSIS — G0430 Acute necrotizing hemorrhagic encephalopathy, unspecified: Secondary | ICD-10-CM | POA: Diagnosis not present

## 2018-01-09 DIAGNOSIS — G0430 Acute necrotizing hemorrhagic encephalopathy, unspecified: Secondary | ICD-10-CM | POA: Diagnosis not present

## 2018-01-10 DIAGNOSIS — G0430 Acute necrotizing hemorrhagic encephalopathy, unspecified: Secondary | ICD-10-CM | POA: Diagnosis not present

## 2018-01-11 DIAGNOSIS — F0281 Dementia in other diseases classified elsewhere with behavioral disturbance: Secondary | ICD-10-CM | POA: Diagnosis not present

## 2018-01-11 DIAGNOSIS — G309 Alzheimer's disease, unspecified: Secondary | ICD-10-CM | POA: Diagnosis not present

## 2018-01-11 DIAGNOSIS — G0430 Acute necrotizing hemorrhagic encephalopathy, unspecified: Secondary | ICD-10-CM | POA: Diagnosis not present

## 2018-01-11 DIAGNOSIS — G4700 Insomnia, unspecified: Secondary | ICD-10-CM | POA: Diagnosis not present

## 2018-01-12 DIAGNOSIS — G0430 Acute necrotizing hemorrhagic encephalopathy, unspecified: Secondary | ICD-10-CM | POA: Diagnosis not present

## 2018-01-13 DIAGNOSIS — G0430 Acute necrotizing hemorrhagic encephalopathy, unspecified: Secondary | ICD-10-CM | POA: Diagnosis not present

## 2018-01-14 DIAGNOSIS — G0430 Acute necrotizing hemorrhagic encephalopathy, unspecified: Secondary | ICD-10-CM | POA: Diagnosis not present

## 2018-01-15 DIAGNOSIS — G0430 Acute necrotizing hemorrhagic encephalopathy, unspecified: Secondary | ICD-10-CM | POA: Diagnosis not present

## 2018-01-16 DIAGNOSIS — G0430 Acute necrotizing hemorrhagic encephalopathy, unspecified: Secondary | ICD-10-CM | POA: Diagnosis not present

## 2018-01-16 IMAGING — CR DG CHEST 2V
2 series · 2 of 2 positions shown · non-contrast
Comparison: Chest radiograph dated 10/22/2016

CLINICAL DATA: [AGE] female with fever and cough.

EXAM:
CHEST  2 VIEW

[w chest lat]
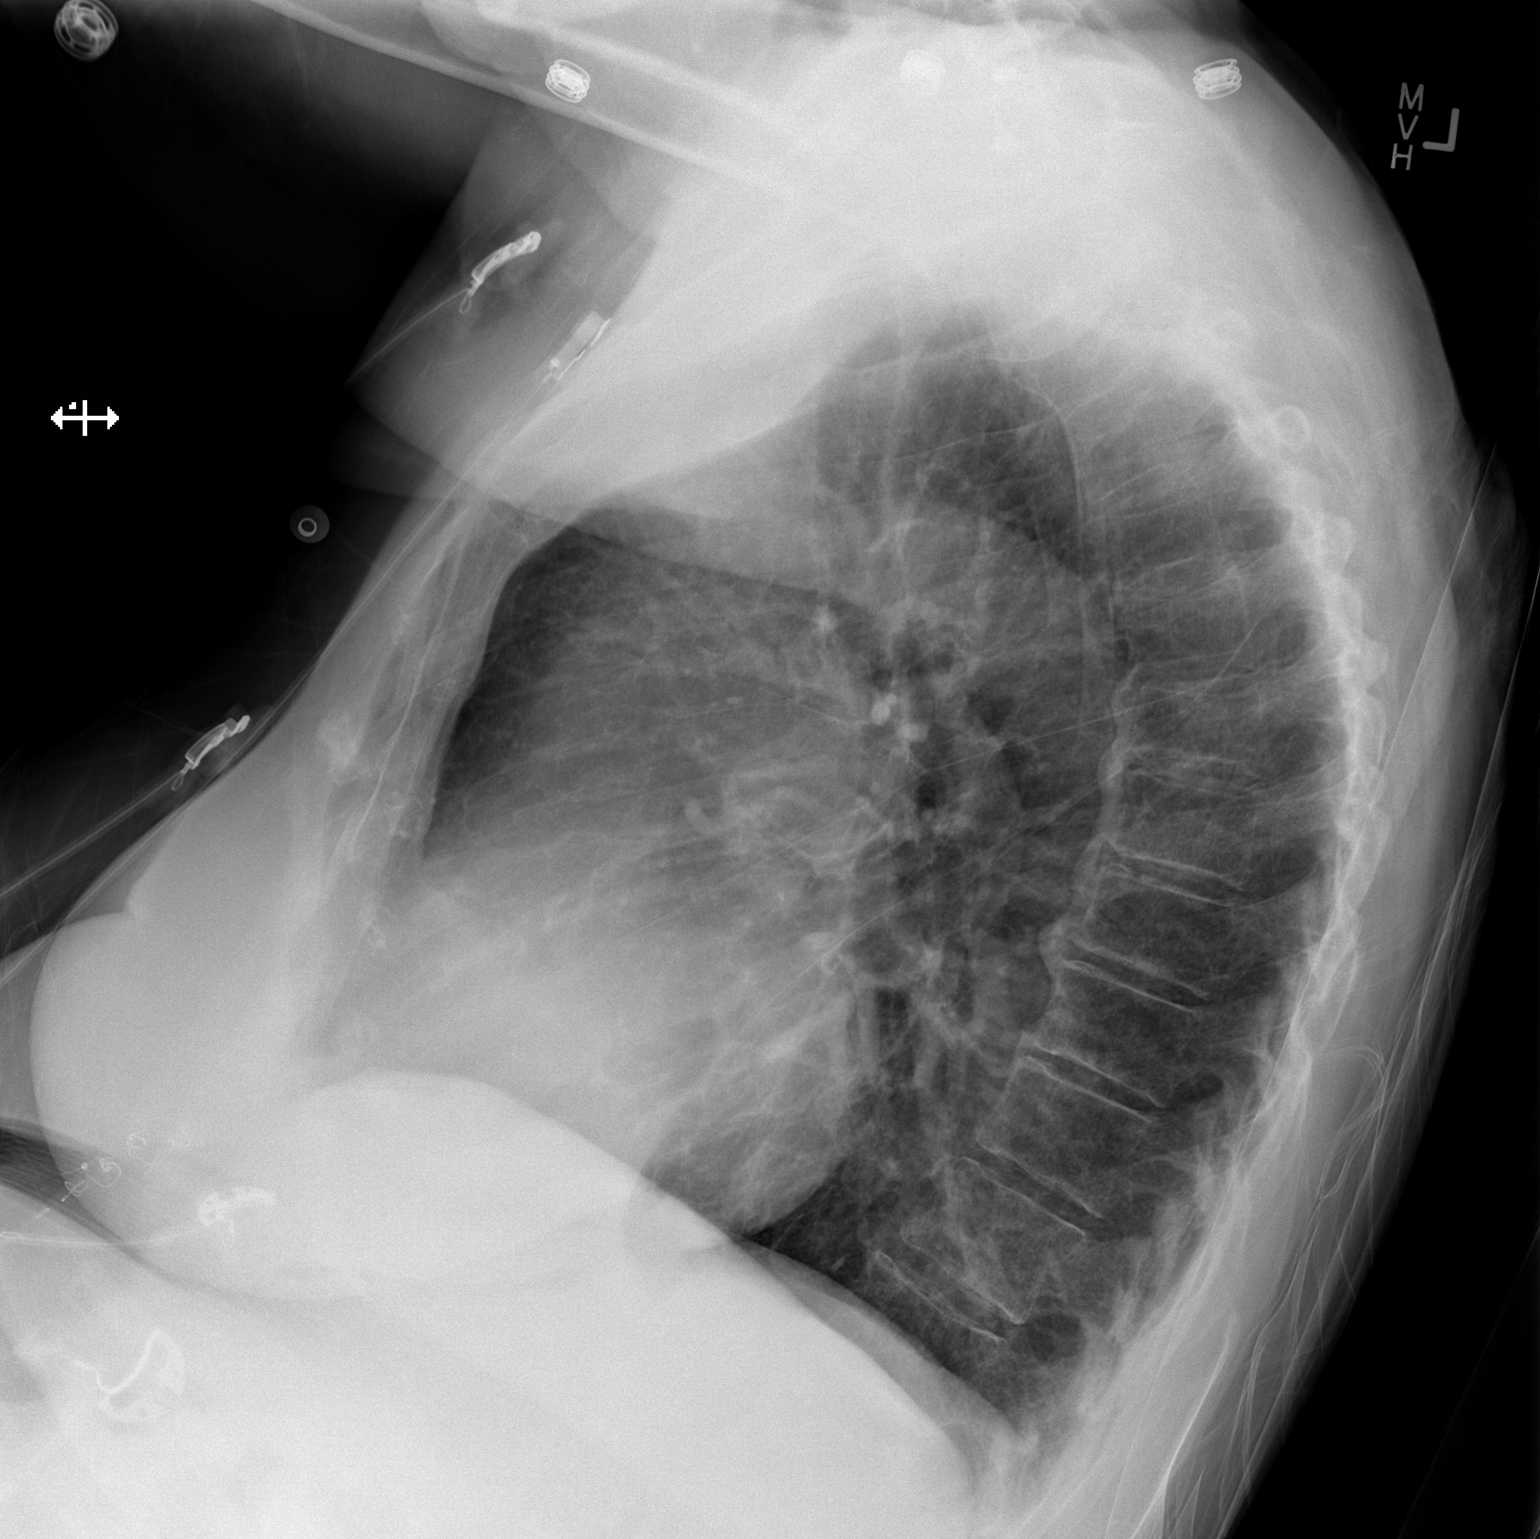

[x chest ap]
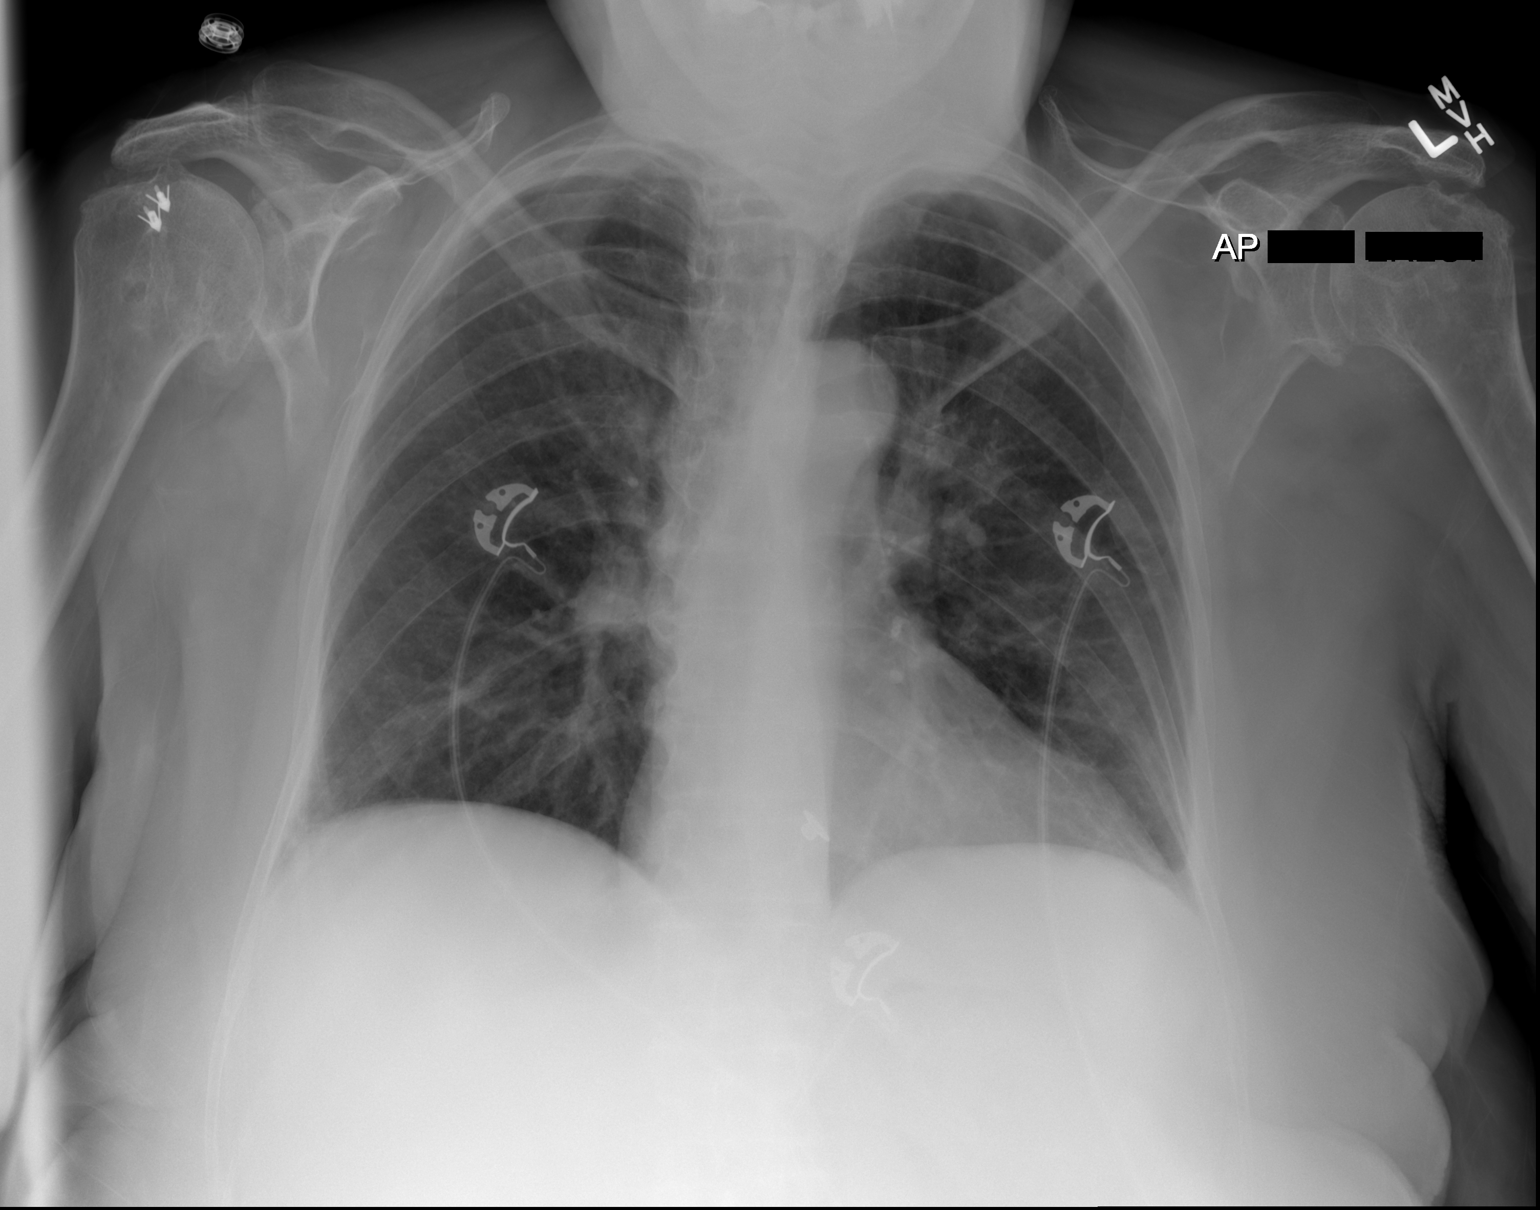

[2 of 2 positions shown; findings below may reference images not displayed]

FINDINGS: The lungs are clear. There is no pleural effusion or pneumothorax.
The cardiac silhouette is within normal limits. There is
degenerative changes of the shoulders. Right rotator cuff repair
with right humeral head anchor pins. No acute osseous pathology.
IMPRESSION: No active cardiopulmonary disease.

## 2018-01-17 DIAGNOSIS — G0430 Acute necrotizing hemorrhagic encephalopathy, unspecified: Secondary | ICD-10-CM | POA: Diagnosis not present

## 2018-01-18 DIAGNOSIS — G0430 Acute necrotizing hemorrhagic encephalopathy, unspecified: Secondary | ICD-10-CM | POA: Diagnosis not present

## 2018-01-19 DIAGNOSIS — G0430 Acute necrotizing hemorrhagic encephalopathy, unspecified: Secondary | ICD-10-CM | POA: Diagnosis not present

## 2018-01-20 DIAGNOSIS — G0430 Acute necrotizing hemorrhagic encephalopathy, unspecified: Secondary | ICD-10-CM | POA: Diagnosis not present

## 2018-01-21 DIAGNOSIS — G0430 Acute necrotizing hemorrhagic encephalopathy, unspecified: Secondary | ICD-10-CM | POA: Diagnosis not present

## 2018-01-22 DIAGNOSIS — G0430 Acute necrotizing hemorrhagic encephalopathy, unspecified: Secondary | ICD-10-CM | POA: Diagnosis not present

## 2018-01-23 DIAGNOSIS — G0430 Acute necrotizing hemorrhagic encephalopathy, unspecified: Secondary | ICD-10-CM | POA: Diagnosis not present

## 2018-01-24 DIAGNOSIS — G0430 Acute necrotizing hemorrhagic encephalopathy, unspecified: Secondary | ICD-10-CM | POA: Diagnosis not present

## 2018-01-25 DIAGNOSIS — G0430 Acute necrotizing hemorrhagic encephalopathy, unspecified: Secondary | ICD-10-CM | POA: Diagnosis not present

## 2018-01-26 DIAGNOSIS — F330 Major depressive disorder, recurrent, mild: Secondary | ICD-10-CM | POA: Diagnosis not present

## 2018-01-26 DIAGNOSIS — G0430 Acute necrotizing hemorrhagic encephalopathy, unspecified: Secondary | ICD-10-CM | POA: Diagnosis not present

## 2018-01-26 DIAGNOSIS — F0281 Dementia in other diseases classified elsewhere with behavioral disturbance: Secondary | ICD-10-CM | POA: Diagnosis not present

## 2018-01-26 DIAGNOSIS — F411 Generalized anxiety disorder: Secondary | ICD-10-CM | POA: Diagnosis not present

## 2018-01-27 DIAGNOSIS — G0430 Acute necrotizing hemorrhagic encephalopathy, unspecified: Secondary | ICD-10-CM | POA: Diagnosis not present

## 2018-01-28 DIAGNOSIS — G0430 Acute necrotizing hemorrhagic encephalopathy, unspecified: Secondary | ICD-10-CM | POA: Diagnosis not present

## 2018-01-29 DIAGNOSIS — G0430 Acute necrotizing hemorrhagic encephalopathy, unspecified: Secondary | ICD-10-CM | POA: Diagnosis not present

## 2018-01-30 DIAGNOSIS — G0430 Acute necrotizing hemorrhagic encephalopathy, unspecified: Secondary | ICD-10-CM | POA: Diagnosis not present

## 2018-01-31 DIAGNOSIS — G0430 Acute necrotizing hemorrhagic encephalopathy, unspecified: Secondary | ICD-10-CM | POA: Diagnosis not present

## 2018-02-01 DIAGNOSIS — G0430 Acute necrotizing hemorrhagic encephalopathy, unspecified: Secondary | ICD-10-CM | POA: Diagnosis not present

## 2018-02-02 DIAGNOSIS — G0430 Acute necrotizing hemorrhagic encephalopathy, unspecified: Secondary | ICD-10-CM | POA: Diagnosis not present

## 2018-02-03 DIAGNOSIS — G0430 Acute necrotizing hemorrhagic encephalopathy, unspecified: Secondary | ICD-10-CM | POA: Diagnosis not present

## 2018-02-04 DIAGNOSIS — G0430 Acute necrotizing hemorrhagic encephalopathy, unspecified: Secondary | ICD-10-CM | POA: Diagnosis not present

## 2018-02-05 DIAGNOSIS — G0430 Acute necrotizing hemorrhagic encephalopathy, unspecified: Secondary | ICD-10-CM | POA: Diagnosis not present

## 2018-02-06 DIAGNOSIS — G0430 Acute necrotizing hemorrhagic encephalopathy, unspecified: Secondary | ICD-10-CM | POA: Diagnosis not present

## 2018-02-07 DIAGNOSIS — G0430 Acute necrotizing hemorrhagic encephalopathy, unspecified: Secondary | ICD-10-CM | POA: Diagnosis not present

## 2018-02-15 DIAGNOSIS — G0430 Acute necrotizing hemorrhagic encephalopathy, unspecified: Secondary | ICD-10-CM | POA: Diagnosis not present

## 2018-02-16 DIAGNOSIS — G0430 Acute necrotizing hemorrhagic encephalopathy, unspecified: Secondary | ICD-10-CM | POA: Diagnosis not present

## 2018-02-17 DIAGNOSIS — G0430 Acute necrotizing hemorrhagic encephalopathy, unspecified: Secondary | ICD-10-CM | POA: Diagnosis not present

## 2018-02-18 DIAGNOSIS — G0430 Acute necrotizing hemorrhagic encephalopathy, unspecified: Secondary | ICD-10-CM | POA: Diagnosis not present

## 2018-02-19 DIAGNOSIS — G0430 Acute necrotizing hemorrhagic encephalopathy, unspecified: Secondary | ICD-10-CM | POA: Diagnosis not present

## 2018-02-20 DIAGNOSIS — G0430 Acute necrotizing hemorrhagic encephalopathy, unspecified: Secondary | ICD-10-CM | POA: Diagnosis not present

## 2018-02-21 DIAGNOSIS — G0430 Acute necrotizing hemorrhagic encephalopathy, unspecified: Secondary | ICD-10-CM | POA: Diagnosis not present

## 2018-02-22 DIAGNOSIS — G0430 Acute necrotizing hemorrhagic encephalopathy, unspecified: Secondary | ICD-10-CM | POA: Diagnosis not present

## 2018-02-23 DIAGNOSIS — G0430 Acute necrotizing hemorrhagic encephalopathy, unspecified: Secondary | ICD-10-CM | POA: Diagnosis not present

## 2018-02-24 DIAGNOSIS — G0430 Acute necrotizing hemorrhagic encephalopathy, unspecified: Secondary | ICD-10-CM | POA: Diagnosis not present

## 2018-02-25 DIAGNOSIS — G0430 Acute necrotizing hemorrhagic encephalopathy, unspecified: Secondary | ICD-10-CM | POA: Diagnosis not present

## 2018-02-26 DIAGNOSIS — G0430 Acute necrotizing hemorrhagic encephalopathy, unspecified: Secondary | ICD-10-CM | POA: Diagnosis not present

## 2018-02-27 DIAGNOSIS — G0430 Acute necrotizing hemorrhagic encephalopathy, unspecified: Secondary | ICD-10-CM | POA: Diagnosis not present

## 2018-02-28 DIAGNOSIS — G0430 Acute necrotizing hemorrhagic encephalopathy, unspecified: Secondary | ICD-10-CM | POA: Diagnosis not present

## 2018-03-01 DIAGNOSIS — Z87440 Personal history of urinary (tract) infections: Secondary | ICD-10-CM | POA: Diagnosis not present

## 2018-03-01 DIAGNOSIS — F0281 Dementia in other diseases classified elsewhere with behavioral disturbance: Secondary | ICD-10-CM | POA: Diagnosis not present

## 2018-03-01 DIAGNOSIS — R300 Dysuria: Secondary | ICD-10-CM | POA: Diagnosis not present

## 2018-03-01 DIAGNOSIS — G309 Alzheimer's disease, unspecified: Secondary | ICD-10-CM | POA: Diagnosis not present

## 2018-03-01 DIAGNOSIS — G0430 Acute necrotizing hemorrhagic encephalopathy, unspecified: Secondary | ICD-10-CM | POA: Diagnosis not present

## 2018-03-02 DIAGNOSIS — G0430 Acute necrotizing hemorrhagic encephalopathy, unspecified: Secondary | ICD-10-CM | POA: Diagnosis not present

## 2018-03-03 DIAGNOSIS — G0430 Acute necrotizing hemorrhagic encephalopathy, unspecified: Secondary | ICD-10-CM | POA: Diagnosis not present

## 2018-03-04 DIAGNOSIS — G0430 Acute necrotizing hemorrhagic encephalopathy, unspecified: Secondary | ICD-10-CM | POA: Diagnosis not present

## 2018-03-05 DIAGNOSIS — G0430 Acute necrotizing hemorrhagic encephalopathy, unspecified: Secondary | ICD-10-CM | POA: Diagnosis not present

## 2018-03-05 IMAGING — CT CT HEAD W/O CM
3 of 8 series · 13 of 47 positions shown, 15 images · non-contrast
Comparison: None.

CLINICAL DATA: Fall

EXAM:
CT HEAD WITHOUT CONTRAST
CT CERVICAL SPINE WITHOUT CONTRAST
TECHNIQUE: Multidetector CT imaging of the head and cervical spine was
performed following the standard protocol without intravenous
contrast. Multiplanar CT image reconstructions of the cervical spine
were also generated.

[Series 5: coronal · coronal · 0.31mm/px · 3 of 59 slices shown]
[im 15/59  brain]
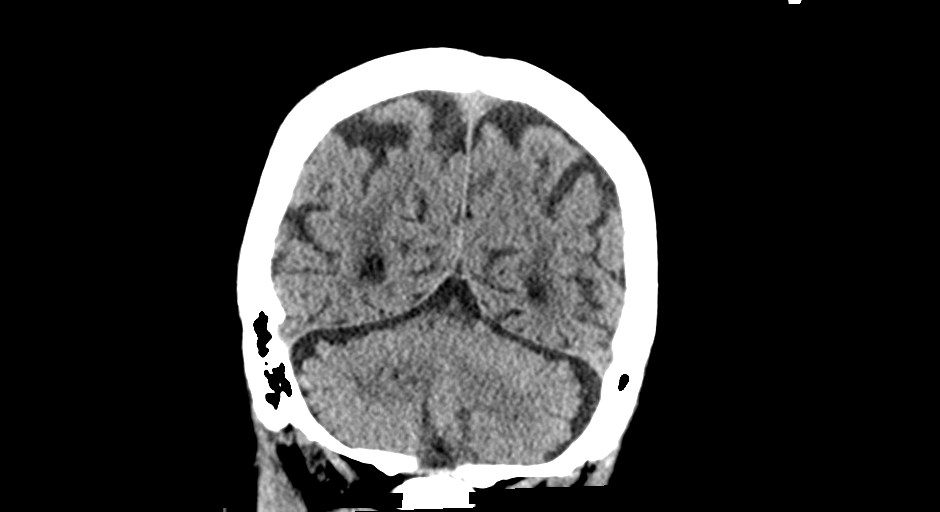
[im 30/59  brain]
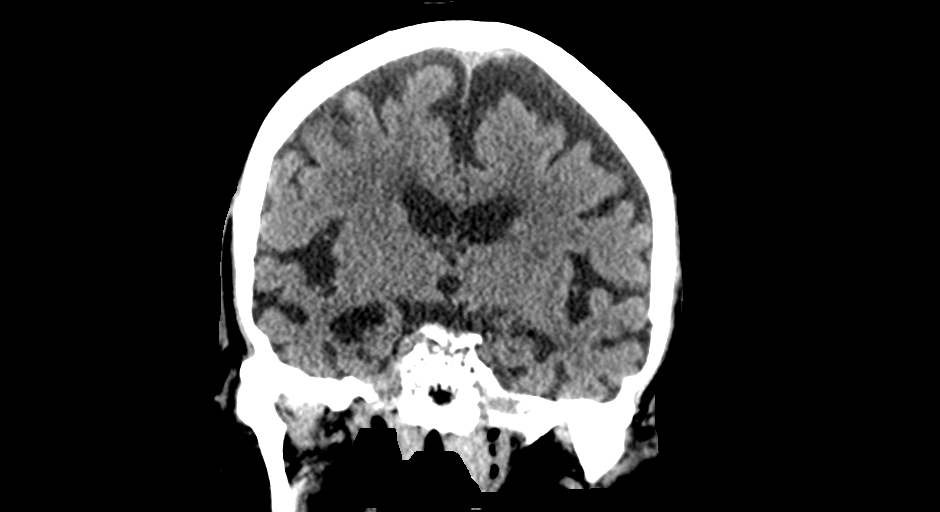
[im 44/59  brain]
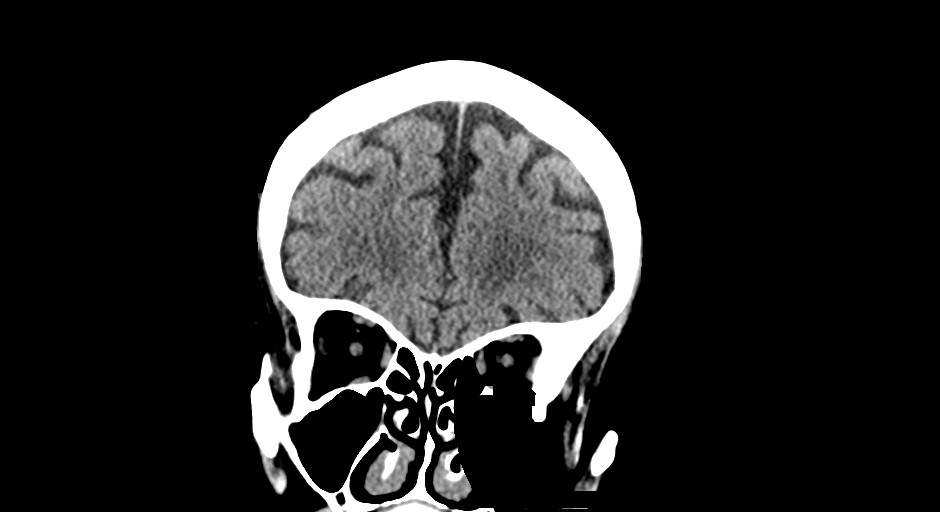

[Series 9: axial recon · axial · 0.21mm/px · z∈[-347,-220]mm · 8 of 89 slices shown, 10 images]
[im 10/89  brain]
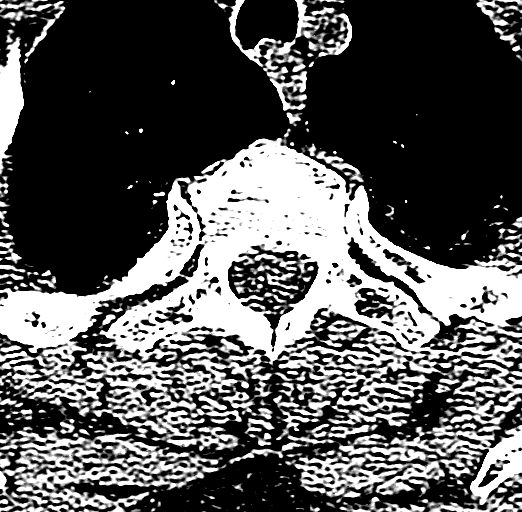
[im 10/89  bone]
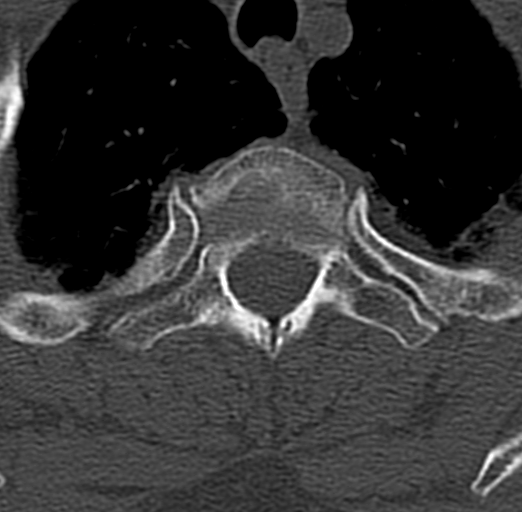
[im 20/89  brain]
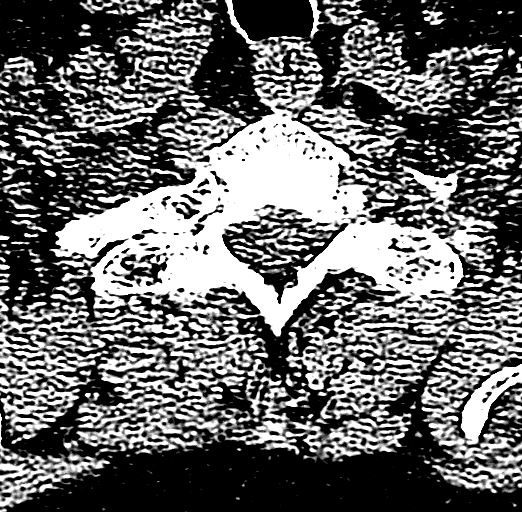
[im 30/89  brain]
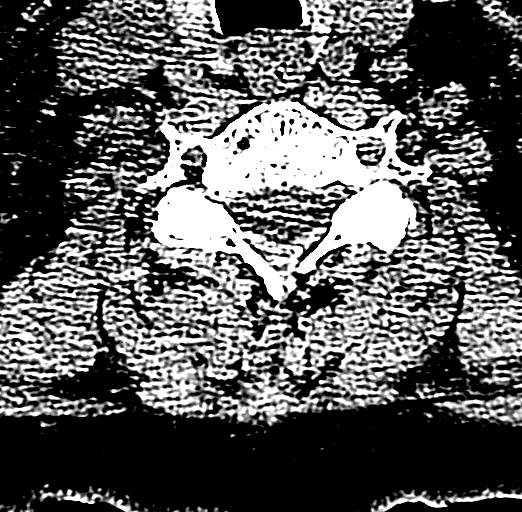
[im 40/89  brain]
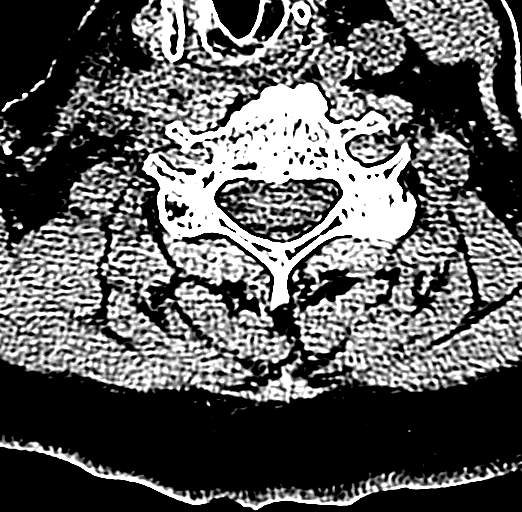
[im 49/89  brain]
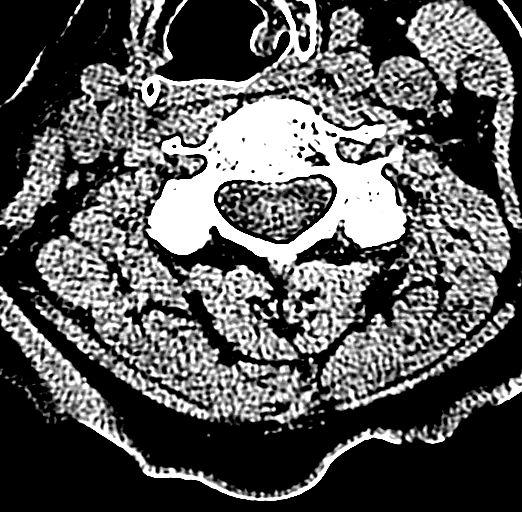
[im 49/89  bone]
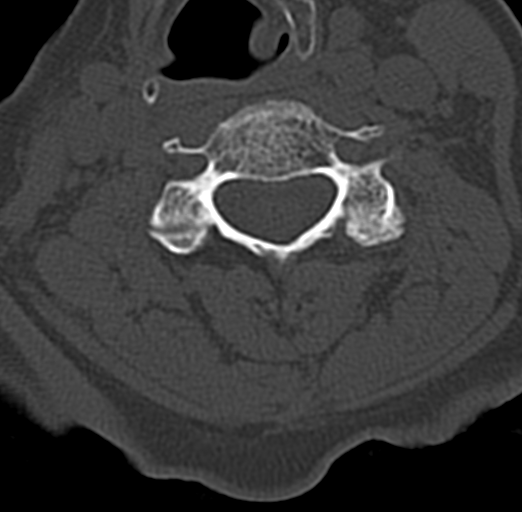
[im 59/89  brain]
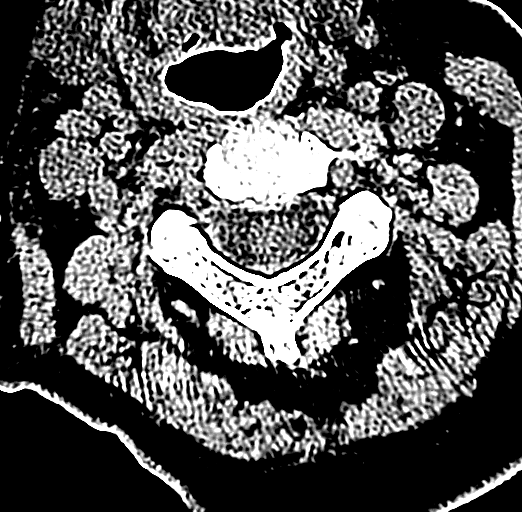
[im 69/89  brain]
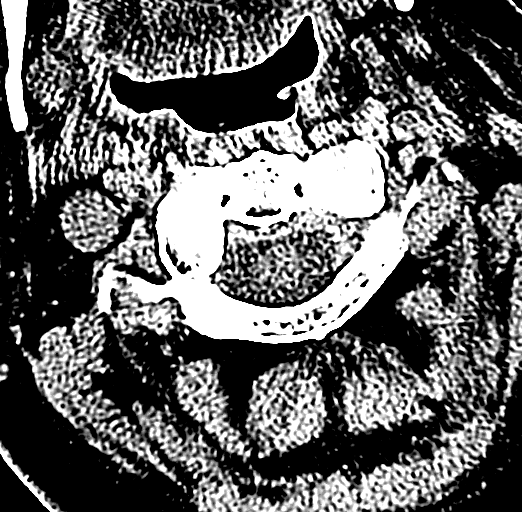
[im 79/89  brain]
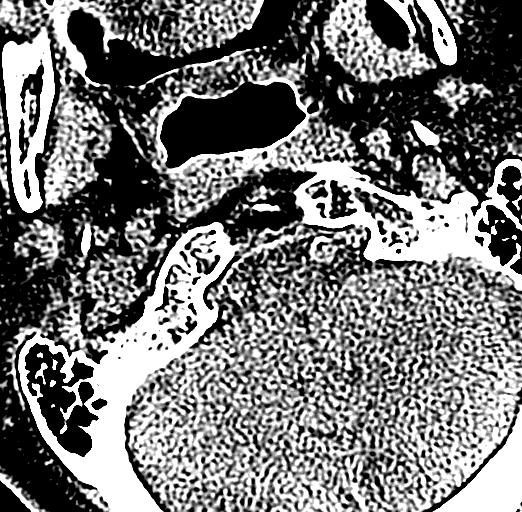

[Series 11: sagittal · sagittal · 0.21mm/px · 2 of 61 slices shown]
[im 21/61  brain]
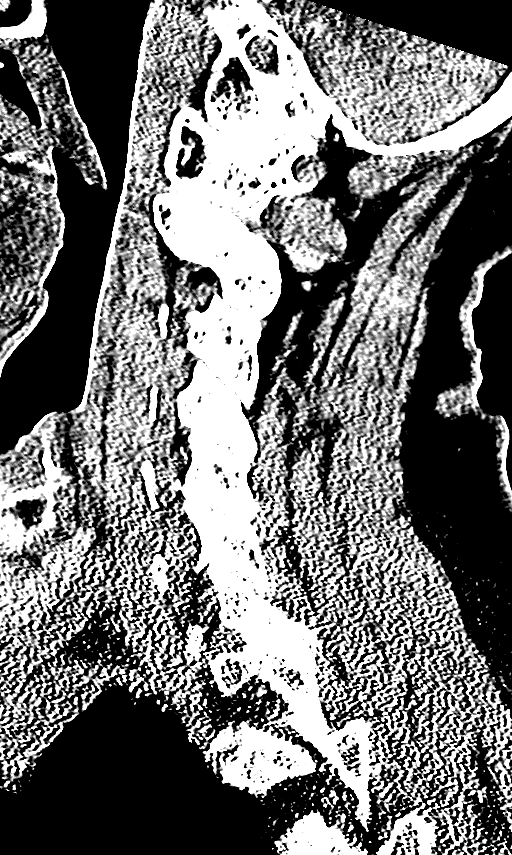
[im 41/61  brain]
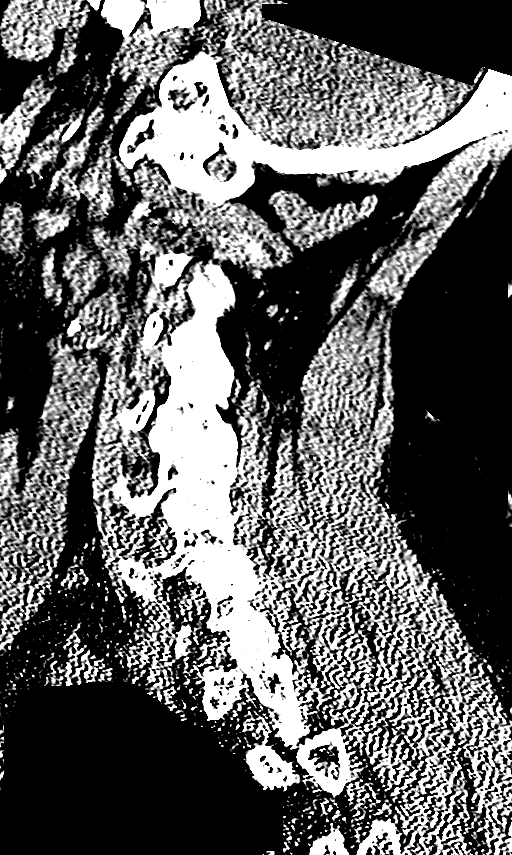

[13 of 47 positions shown; findings below may reference images not displayed]

FINDINGS: CT HEAD FINDINGS

Brain: No mass lesion, intraparenchymal hemorrhage or extra-axial
collection. No evidence of acute cortical infarct. There is
periventricular hypoattenuation compatible with chronic
microvascular disease.

Vascular: No hyperdense vessel or unexpected calcification.

Skull: Normal visualized skull base, calvarium and extracranial soft
tissues.

Sinuses/Orbits: No sinus fluid levels or advanced mucosal
thickening. No mastoid effusion. Normal orbits.

CT CERVICAL SPINE FINDINGS

Alignment: No static subluxation. Facets are aligned. Occipital
condyles are normally positioned.

Skull base and vertebrae: No acute fracture.

Soft tissues and spinal canal: No prevertebral fluid or swelling. No
visible canal hematoma.

Disc levels: Moderate right foraminal stenosis at C5-C6.

Upper chest: No pneumothorax, pulmonary nodule or pleural effusion.

Other: Normal visualized paraspinal cervical soft tissues.
IMPRESSION: 1. No acute intracranial abnormality.
2. No acute fracture or static subluxation of the cervical spine.

## 2018-03-05 IMAGING — CR DG HIP (WITH OR WITHOUT PELVIS) 1V*R*
3 series · 3 of 3 positions shown · non-contrast
Comparison: Intraoperative radiograph dated 09/20/2015

CLINICAL DATA: [AGE] female with fall and right hip pain.

EXAM:
DG HIP (WITH OR WITHOUT PELVIS) 1V RIGHT

[w hip lat right]
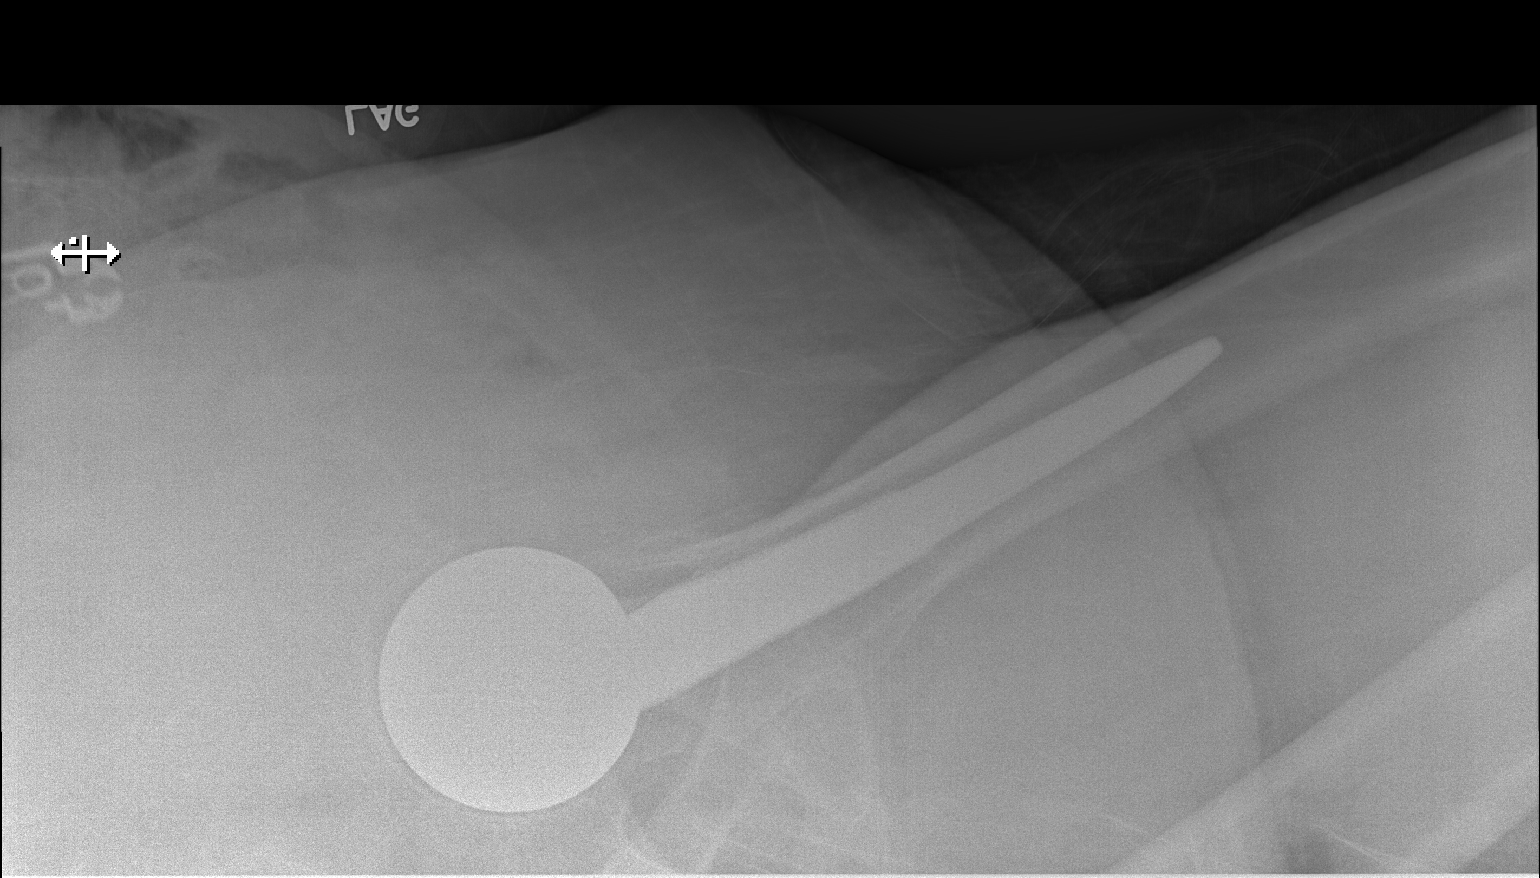

[x pelvis]
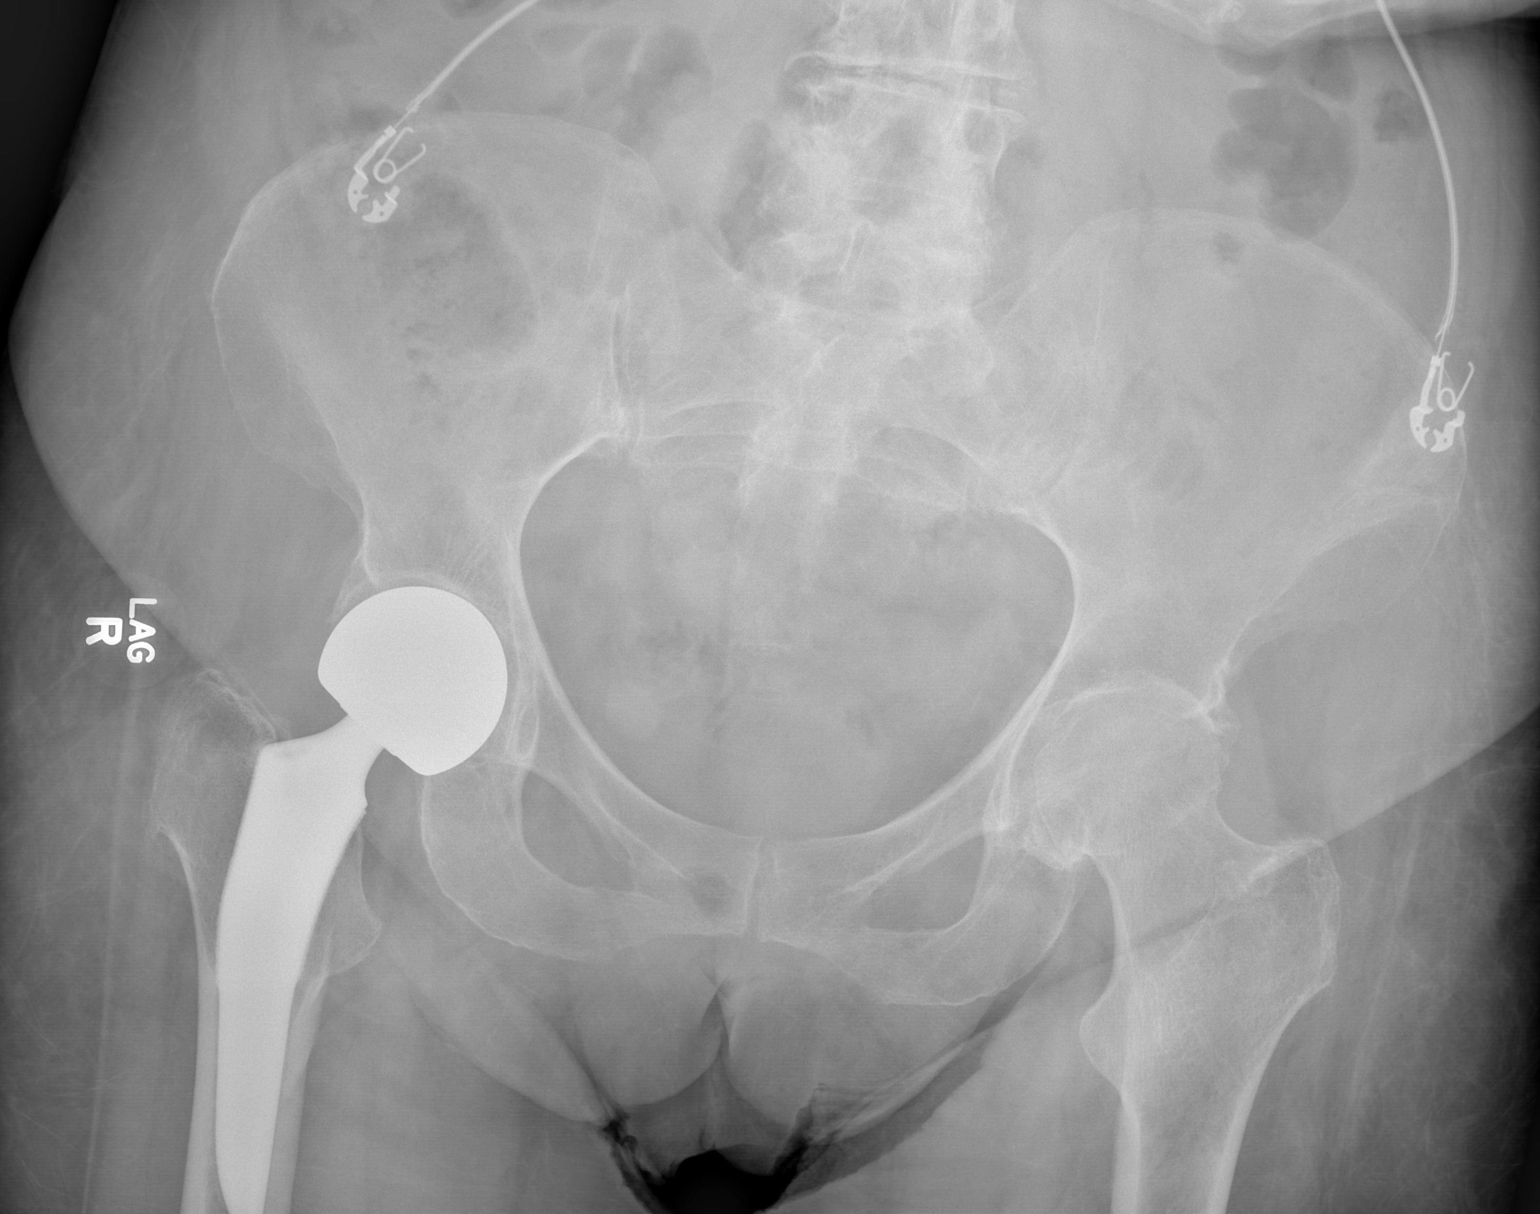

[x hip ap right]
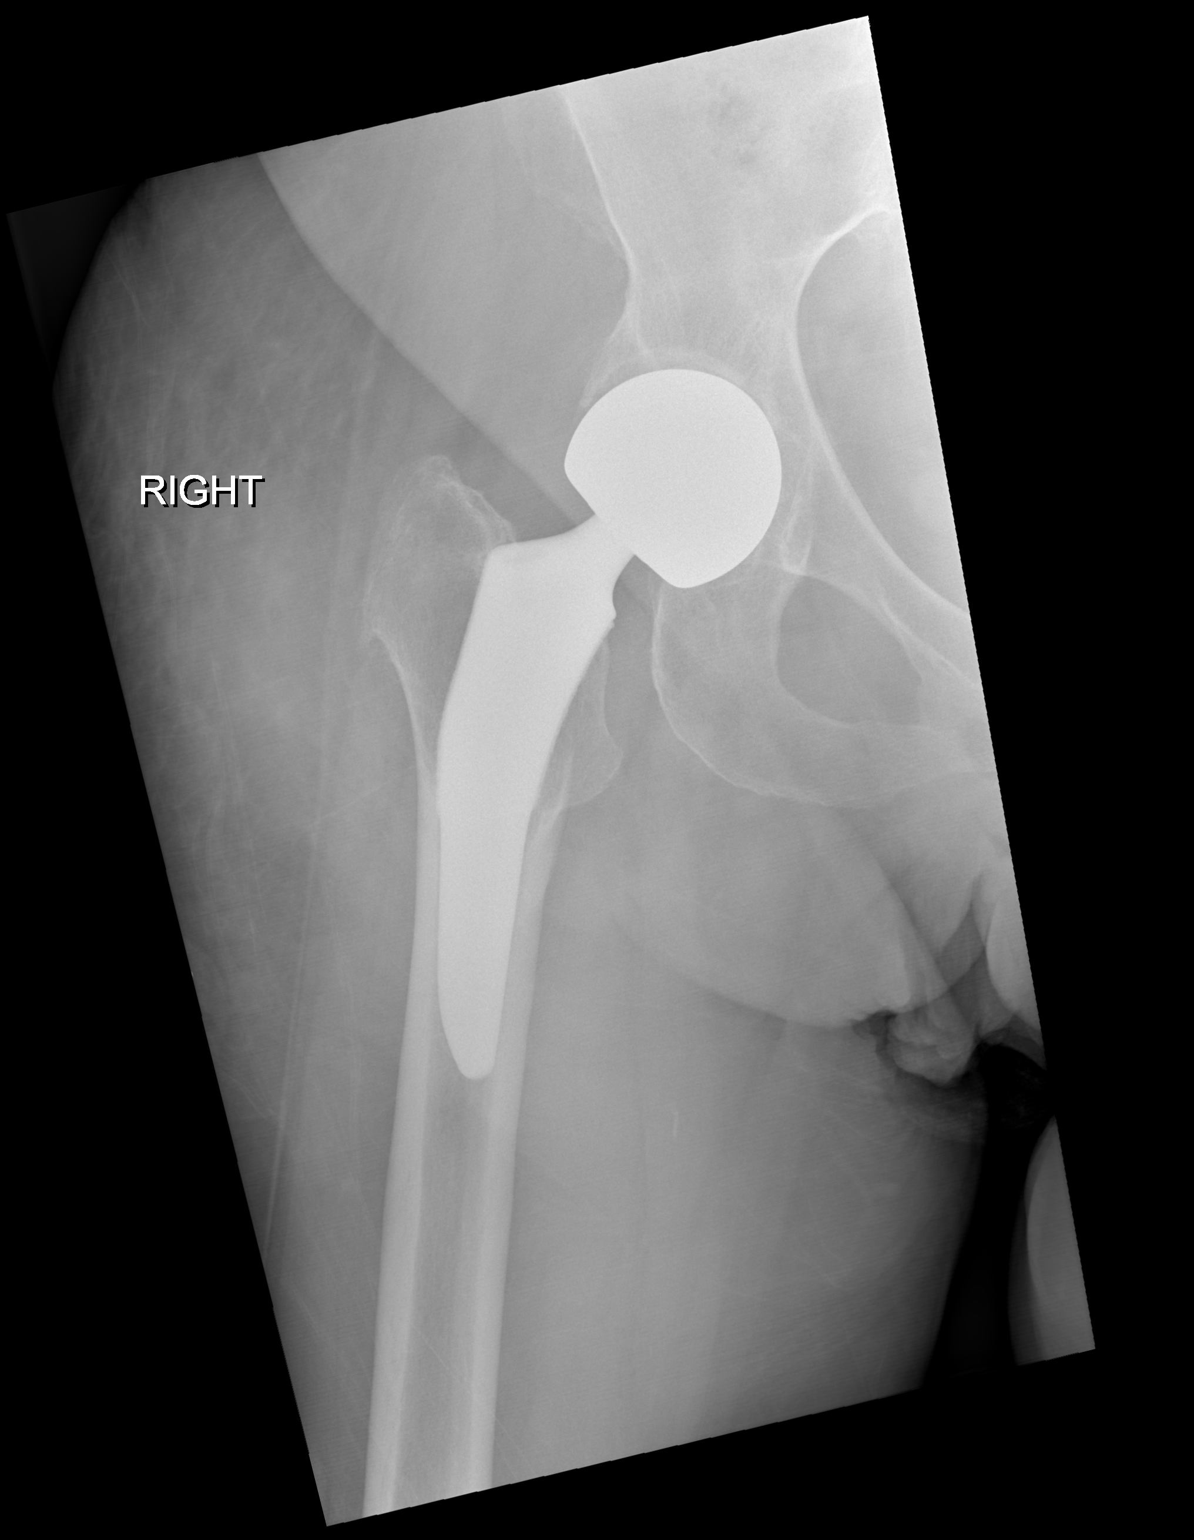

[3 of 3 positions shown; findings below may reference images not displayed]

FINDINGS: There is no acute fracture or dislocation. Right hip prosthesis
appears intact. The bones are osteopenic. There is severe
osteoarthritic changes of the left hip. The soft tissues appear
unremarkable. A 15 mm rounded slightly more radiopaque density in
the right hemipelvis is not well characterized but may represent a
partially calcified fibroids or other pelvic lesions/phleboliths.
IMPRESSION: 1. No acute fracture or dislocation.
2. Osteopenia with severe osteoarthritic changes of the left hip.

## 2018-03-06 DIAGNOSIS — G0430 Acute necrotizing hemorrhagic encephalopathy, unspecified: Secondary | ICD-10-CM | POA: Diagnosis not present

## 2018-03-07 DIAGNOSIS — G0430 Acute necrotizing hemorrhagic encephalopathy, unspecified: Secondary | ICD-10-CM | POA: Diagnosis not present

## 2018-03-08 DIAGNOSIS — G0430 Acute necrotizing hemorrhagic encephalopathy, unspecified: Secondary | ICD-10-CM | POA: Diagnosis not present

## 2018-03-09 DIAGNOSIS — G0430 Acute necrotizing hemorrhagic encephalopathy, unspecified: Secondary | ICD-10-CM | POA: Diagnosis not present

## 2018-03-10 DIAGNOSIS — G0430 Acute necrotizing hemorrhagic encephalopathy, unspecified: Secondary | ICD-10-CM | POA: Diagnosis not present

## 2018-03-11 DIAGNOSIS — G0430 Acute necrotizing hemorrhagic encephalopathy, unspecified: Secondary | ICD-10-CM | POA: Diagnosis not present

## 2018-03-12 DIAGNOSIS — G0430 Acute necrotizing hemorrhagic encephalopathy, unspecified: Secondary | ICD-10-CM | POA: Diagnosis not present

## 2018-03-13 DIAGNOSIS — G0430 Acute necrotizing hemorrhagic encephalopathy, unspecified: Secondary | ICD-10-CM | POA: Diagnosis not present

## 2018-03-14 DIAGNOSIS — G0430 Acute necrotizing hemorrhagic encephalopathy, unspecified: Secondary | ICD-10-CM | POA: Diagnosis not present

## 2018-03-15 DIAGNOSIS — G0430 Acute necrotizing hemorrhagic encephalopathy, unspecified: Secondary | ICD-10-CM | POA: Diagnosis not present

## 2018-03-16 DIAGNOSIS — G0430 Acute necrotizing hemorrhagic encephalopathy, unspecified: Secondary | ICD-10-CM | POA: Diagnosis not present

## 2018-03-17 DIAGNOSIS — G0430 Acute necrotizing hemorrhagic encephalopathy, unspecified: Secondary | ICD-10-CM | POA: Diagnosis not present

## 2018-03-18 DIAGNOSIS — G0430 Acute necrotizing hemorrhagic encephalopathy, unspecified: Secondary | ICD-10-CM | POA: Diagnosis not present

## 2018-03-19 DIAGNOSIS — G0430 Acute necrotizing hemorrhagic encephalopathy, unspecified: Secondary | ICD-10-CM | POA: Diagnosis not present

## 2018-03-20 DIAGNOSIS — G0430 Acute necrotizing hemorrhagic encephalopathy, unspecified: Secondary | ICD-10-CM | POA: Diagnosis not present

## 2018-03-21 DIAGNOSIS — G0430 Acute necrotizing hemorrhagic encephalopathy, unspecified: Secondary | ICD-10-CM | POA: Diagnosis not present

## 2018-03-22 DIAGNOSIS — G0430 Acute necrotizing hemorrhagic encephalopathy, unspecified: Secondary | ICD-10-CM | POA: Diagnosis not present

## 2018-03-23 DIAGNOSIS — G0430 Acute necrotizing hemorrhagic encephalopathy, unspecified: Secondary | ICD-10-CM | POA: Diagnosis not present

## 2018-03-24 DIAGNOSIS — G0430 Acute necrotizing hemorrhagic encephalopathy, unspecified: Secondary | ICD-10-CM | POA: Diagnosis not present

## 2018-03-25 DIAGNOSIS — G0430 Acute necrotizing hemorrhagic encephalopathy, unspecified: Secondary | ICD-10-CM | POA: Diagnosis not present

## 2018-03-26 DIAGNOSIS — G0430 Acute necrotizing hemorrhagic encephalopathy, unspecified: Secondary | ICD-10-CM | POA: Diagnosis not present

## 2018-03-27 DIAGNOSIS — G0430 Acute necrotizing hemorrhagic encephalopathy, unspecified: Secondary | ICD-10-CM | POA: Diagnosis not present

## 2018-03-28 DIAGNOSIS — G0430 Acute necrotizing hemorrhagic encephalopathy, unspecified: Secondary | ICD-10-CM | POA: Diagnosis not present

## 2018-03-29 DIAGNOSIS — G0430 Acute necrotizing hemorrhagic encephalopathy, unspecified: Secondary | ICD-10-CM | POA: Diagnosis not present

## 2018-03-30 DIAGNOSIS — G0430 Acute necrotizing hemorrhagic encephalopathy, unspecified: Secondary | ICD-10-CM | POA: Diagnosis not present

## 2018-03-30 DIAGNOSIS — F0281 Dementia in other diseases classified elsewhere with behavioral disturbance: Secondary | ICD-10-CM | POA: Diagnosis not present

## 2018-03-30 DIAGNOSIS — F411 Generalized anxiety disorder: Secondary | ICD-10-CM | POA: Diagnosis not present

## 2018-03-30 DIAGNOSIS — F330 Major depressive disorder, recurrent, mild: Secondary | ICD-10-CM | POA: Diagnosis not present

## 2018-03-31 DIAGNOSIS — G0430 Acute necrotizing hemorrhagic encephalopathy, unspecified: Secondary | ICD-10-CM | POA: Diagnosis not present

## 2018-04-01 DIAGNOSIS — G0430 Acute necrotizing hemorrhagic encephalopathy, unspecified: Secondary | ICD-10-CM | POA: Diagnosis not present

## 2018-04-02 DIAGNOSIS — G0430 Acute necrotizing hemorrhagic encephalopathy, unspecified: Secondary | ICD-10-CM | POA: Diagnosis not present

## 2018-04-03 DIAGNOSIS — G0430 Acute necrotizing hemorrhagic encephalopathy, unspecified: Secondary | ICD-10-CM | POA: Diagnosis not present

## 2018-04-04 DIAGNOSIS — G0430 Acute necrotizing hemorrhagic encephalopathy, unspecified: Secondary | ICD-10-CM | POA: Diagnosis not present

## 2018-04-05 DIAGNOSIS — G0430 Acute necrotizing hemorrhagic encephalopathy, unspecified: Secondary | ICD-10-CM | POA: Diagnosis not present

## 2018-04-06 DIAGNOSIS — G0430 Acute necrotizing hemorrhagic encephalopathy, unspecified: Secondary | ICD-10-CM | POA: Diagnosis not present

## 2018-04-07 DIAGNOSIS — G0430 Acute necrotizing hemorrhagic encephalopathy, unspecified: Secondary | ICD-10-CM | POA: Diagnosis not present

## 2018-04-08 DIAGNOSIS — G0430 Acute necrotizing hemorrhagic encephalopathy, unspecified: Secondary | ICD-10-CM | POA: Diagnosis not present

## 2018-04-09 DIAGNOSIS — G0430 Acute necrotizing hemorrhagic encephalopathy, unspecified: Secondary | ICD-10-CM | POA: Diagnosis not present

## 2018-04-10 DIAGNOSIS — G0430 Acute necrotizing hemorrhagic encephalopathy, unspecified: Secondary | ICD-10-CM | POA: Diagnosis not present

## 2018-04-11 DIAGNOSIS — G0430 Acute necrotizing hemorrhagic encephalopathy, unspecified: Secondary | ICD-10-CM | POA: Diagnosis not present

## 2018-04-12 DIAGNOSIS — G0430 Acute necrotizing hemorrhagic encephalopathy, unspecified: Secondary | ICD-10-CM | POA: Diagnosis not present

## 2018-04-12 IMAGING — CR DG CHEST 2V
2 series · 2 of 2 positions shown · non-contrast
Comparison: Radiograph 10/29/2016, additional priors

CLINICAL DATA: Chest pain.  Cough.

EXAM:
CHEST  2 VIEW

[chest lat]
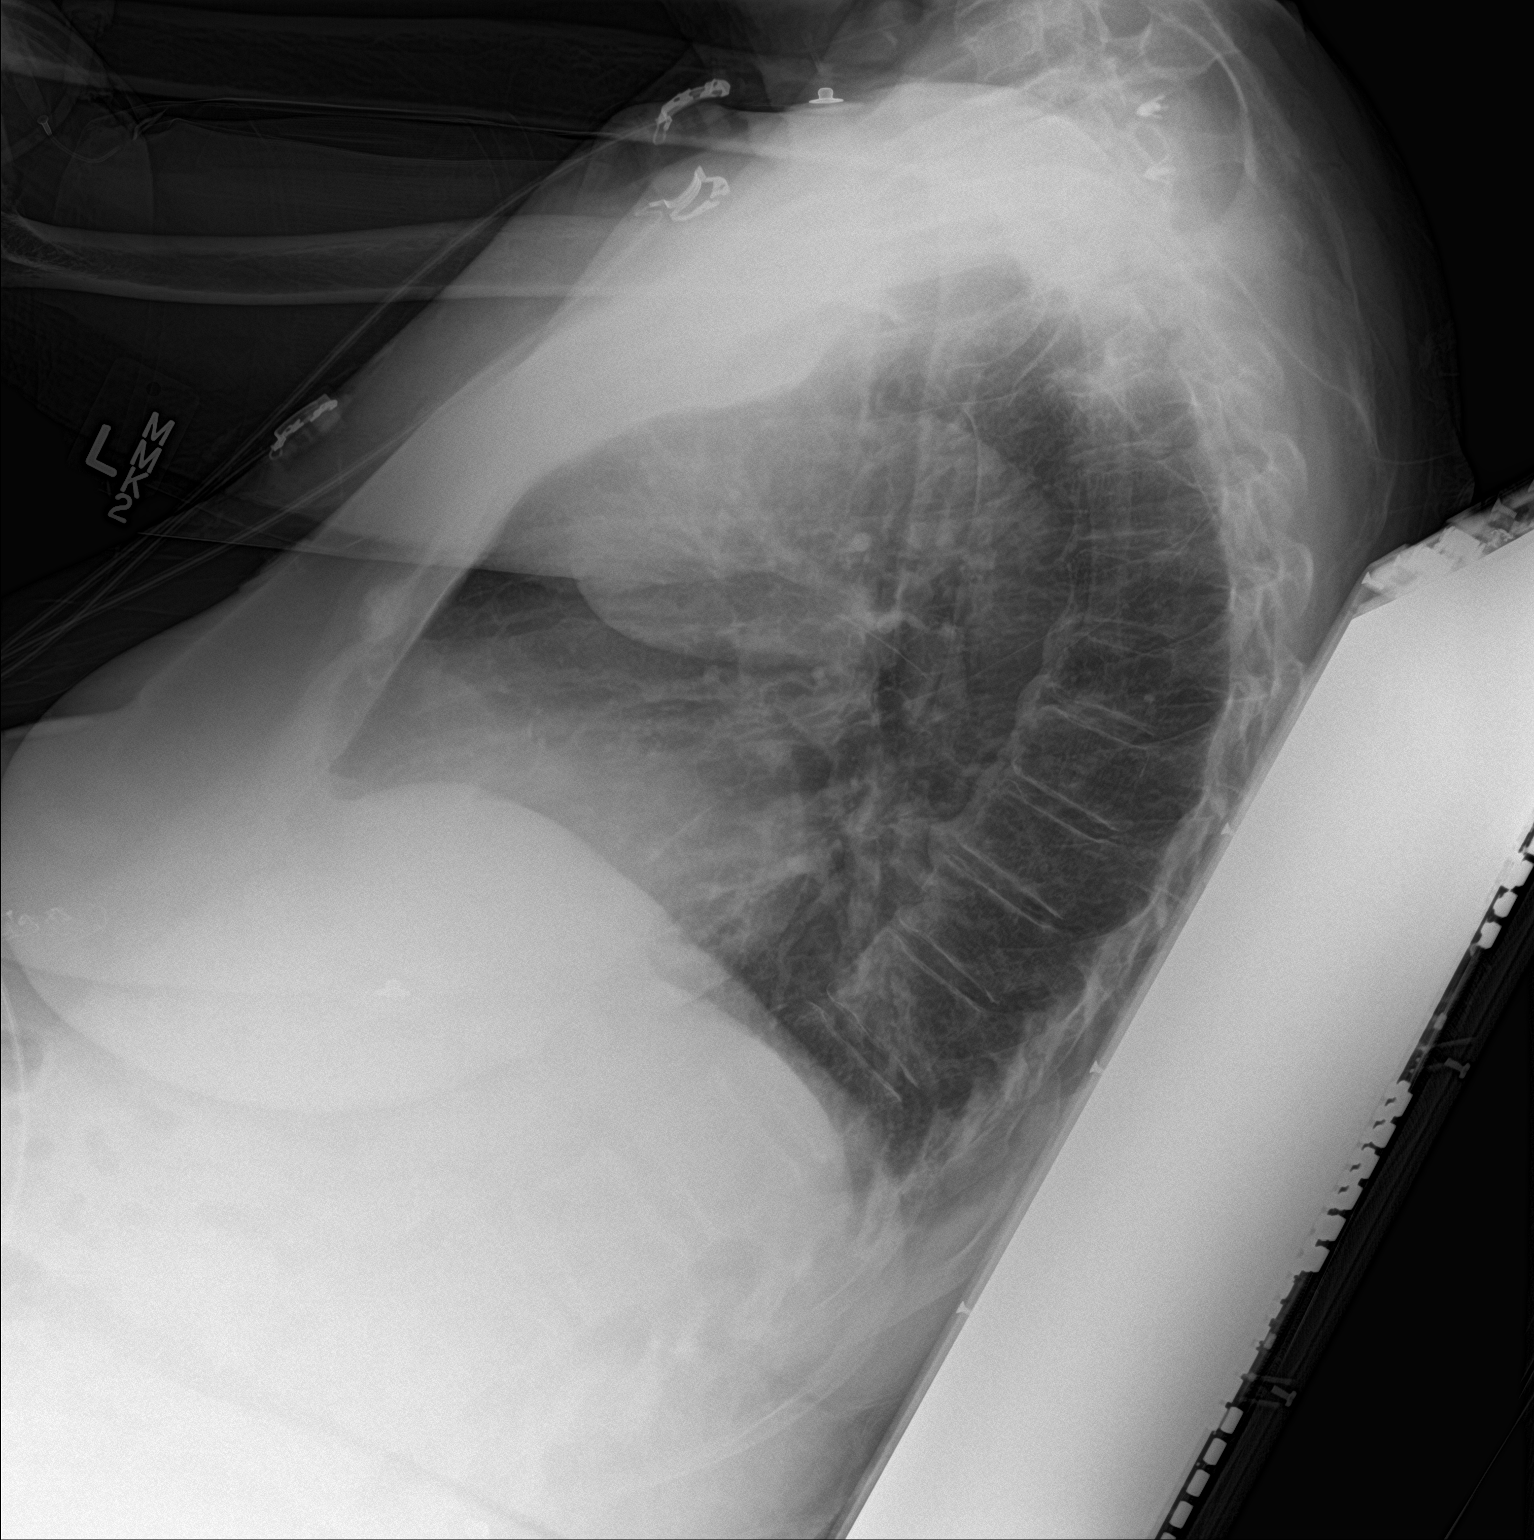

[chest ap]
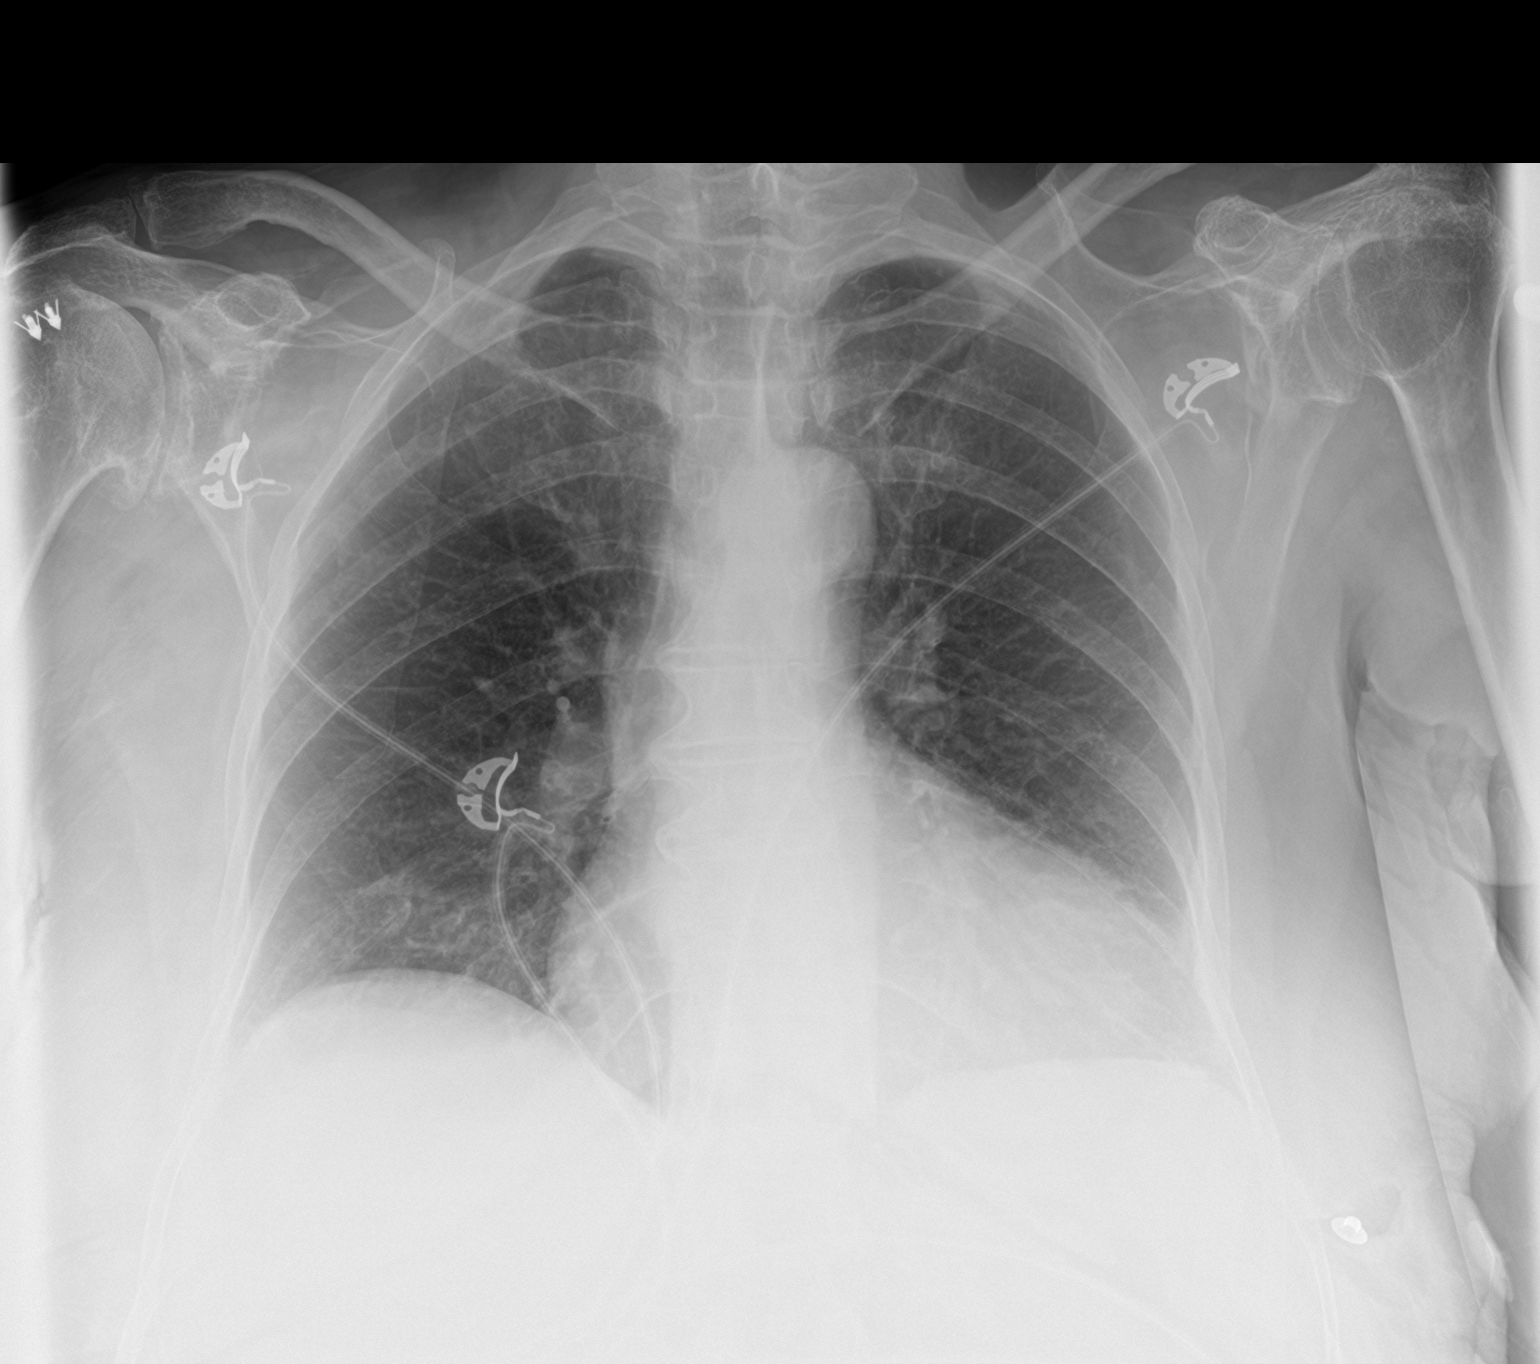

[2 of 2 positions shown; findings below may reference images not displayed]

FINDINGS: The cardiomediastinal contours are normal, mild cardiomegaly,
similar to prior. Mild subsegmental right lung base atelectasis.
Pulmonary vasculature is normal. No consolidation, pleural effusion,
or pneumothorax. No acute osseous abnormalities are seen.
IMPRESSION: Stable mild cardiomegaly.  Mild right basilar atelectasis.

## 2018-04-13 DIAGNOSIS — G0430 Acute necrotizing hemorrhagic encephalopathy, unspecified: Secondary | ICD-10-CM | POA: Diagnosis not present

## 2018-04-14 DIAGNOSIS — G0430 Acute necrotizing hemorrhagic encephalopathy, unspecified: Secondary | ICD-10-CM | POA: Diagnosis not present

## 2018-04-15 DIAGNOSIS — G0430 Acute necrotizing hemorrhagic encephalopathy, unspecified: Secondary | ICD-10-CM | POA: Diagnosis not present

## 2018-04-16 DIAGNOSIS — G0430 Acute necrotizing hemorrhagic encephalopathy, unspecified: Secondary | ICD-10-CM | POA: Diagnosis not present

## 2018-04-17 DIAGNOSIS — G0430 Acute necrotizing hemorrhagic encephalopathy, unspecified: Secondary | ICD-10-CM | POA: Diagnosis not present

## 2018-04-18 DIAGNOSIS — G0430 Acute necrotizing hemorrhagic encephalopathy, unspecified: Secondary | ICD-10-CM | POA: Diagnosis not present

## 2018-04-19 DIAGNOSIS — G0430 Acute necrotizing hemorrhagic encephalopathy, unspecified: Secondary | ICD-10-CM | POA: Diagnosis not present

## 2018-04-20 DIAGNOSIS — G0430 Acute necrotizing hemorrhagic encephalopathy, unspecified: Secondary | ICD-10-CM | POA: Diagnosis not present

## 2018-04-21 DIAGNOSIS — G0430 Acute necrotizing hemorrhagic encephalopathy, unspecified: Secondary | ICD-10-CM | POA: Diagnosis not present

## 2018-04-22 DIAGNOSIS — G0430 Acute necrotizing hemorrhagic encephalopathy, unspecified: Secondary | ICD-10-CM | POA: Diagnosis not present

## 2018-04-23 DIAGNOSIS — G0430 Acute necrotizing hemorrhagic encephalopathy, unspecified: Secondary | ICD-10-CM | POA: Diagnosis not present

## 2018-04-24 DIAGNOSIS — G0430 Acute necrotizing hemorrhagic encephalopathy, unspecified: Secondary | ICD-10-CM | POA: Diagnosis not present

## 2018-04-25 DIAGNOSIS — G0430 Acute necrotizing hemorrhagic encephalopathy, unspecified: Secondary | ICD-10-CM | POA: Diagnosis not present

## 2018-04-26 DIAGNOSIS — G0430 Acute necrotizing hemorrhagic encephalopathy, unspecified: Secondary | ICD-10-CM | POA: Diagnosis not present

## 2018-04-27 DIAGNOSIS — F411 Generalized anxiety disorder: Secondary | ICD-10-CM | POA: Diagnosis not present

## 2018-04-27 DIAGNOSIS — F0281 Dementia in other diseases classified elsewhere with behavioral disturbance: Secondary | ICD-10-CM | POA: Diagnosis not present

## 2018-04-27 DIAGNOSIS — F330 Major depressive disorder, recurrent, mild: Secondary | ICD-10-CM | POA: Diagnosis not present

## 2018-04-27 DIAGNOSIS — G0430 Acute necrotizing hemorrhagic encephalopathy, unspecified: Secondary | ICD-10-CM | POA: Diagnosis not present

## 2018-04-28 DIAGNOSIS — G0430 Acute necrotizing hemorrhagic encephalopathy, unspecified: Secondary | ICD-10-CM | POA: Diagnosis not present

## 2018-04-29 DIAGNOSIS — G0430 Acute necrotizing hemorrhagic encephalopathy, unspecified: Secondary | ICD-10-CM | POA: Diagnosis not present

## 2018-04-30 DIAGNOSIS — G0430 Acute necrotizing hemorrhagic encephalopathy, unspecified: Secondary | ICD-10-CM | POA: Diagnosis not present

## 2018-05-01 DIAGNOSIS — G0430 Acute necrotizing hemorrhagic encephalopathy, unspecified: Secondary | ICD-10-CM | POA: Diagnosis not present

## 2018-05-02 DIAGNOSIS — G0430 Acute necrotizing hemorrhagic encephalopathy, unspecified: Secondary | ICD-10-CM | POA: Diagnosis not present

## 2018-05-10 DIAGNOSIS — G0430 Acute necrotizing hemorrhagic encephalopathy, unspecified: Secondary | ICD-10-CM | POA: Diagnosis not present

## 2018-05-11 DIAGNOSIS — G0430 Acute necrotizing hemorrhagic encephalopathy, unspecified: Secondary | ICD-10-CM | POA: Diagnosis not present

## 2018-05-12 DIAGNOSIS — G0430 Acute necrotizing hemorrhagic encephalopathy, unspecified: Secondary | ICD-10-CM | POA: Diagnosis not present

## 2018-05-13 DIAGNOSIS — G0430 Acute necrotizing hemorrhagic encephalopathy, unspecified: Secondary | ICD-10-CM | POA: Diagnosis not present

## 2018-05-14 DIAGNOSIS — G0430 Acute necrotizing hemorrhagic encephalopathy, unspecified: Secondary | ICD-10-CM | POA: Diagnosis not present

## 2018-05-15 DIAGNOSIS — G0430 Acute necrotizing hemorrhagic encephalopathy, unspecified: Secondary | ICD-10-CM | POA: Diagnosis not present

## 2018-05-16 DIAGNOSIS — G0430 Acute necrotizing hemorrhagic encephalopathy, unspecified: Secondary | ICD-10-CM | POA: Diagnosis not present

## 2018-05-17 DIAGNOSIS — G0430 Acute necrotizing hemorrhagic encephalopathy, unspecified: Secondary | ICD-10-CM | POA: Diagnosis not present

## 2018-05-18 DIAGNOSIS — G0430 Acute necrotizing hemorrhagic encephalopathy, unspecified: Secondary | ICD-10-CM | POA: Diagnosis not present

## 2018-05-19 DIAGNOSIS — G0430 Acute necrotizing hemorrhagic encephalopathy, unspecified: Secondary | ICD-10-CM | POA: Diagnosis not present

## 2018-05-20 DIAGNOSIS — G0430 Acute necrotizing hemorrhagic encephalopathy, unspecified: Secondary | ICD-10-CM | POA: Diagnosis not present

## 2018-05-21 DIAGNOSIS — G0430 Acute necrotizing hemorrhagic encephalopathy, unspecified: Secondary | ICD-10-CM | POA: Diagnosis not present

## 2018-06-02 DEATH — deceased

## 2018-06-12 IMAGING — CR DG HIP (WITH OR WITHOUT PELVIS) 2V BILAT
3 series · 3 of 3 positions shown · non-contrast
Comparison: Bilateral hip radiographs September 12, 2016

CLINICAL DATA: Pain after fall.

EXAM:
DG HIP (WITH OR WITHOUT PELVIS) 2V BILAT

[pelvis ap]
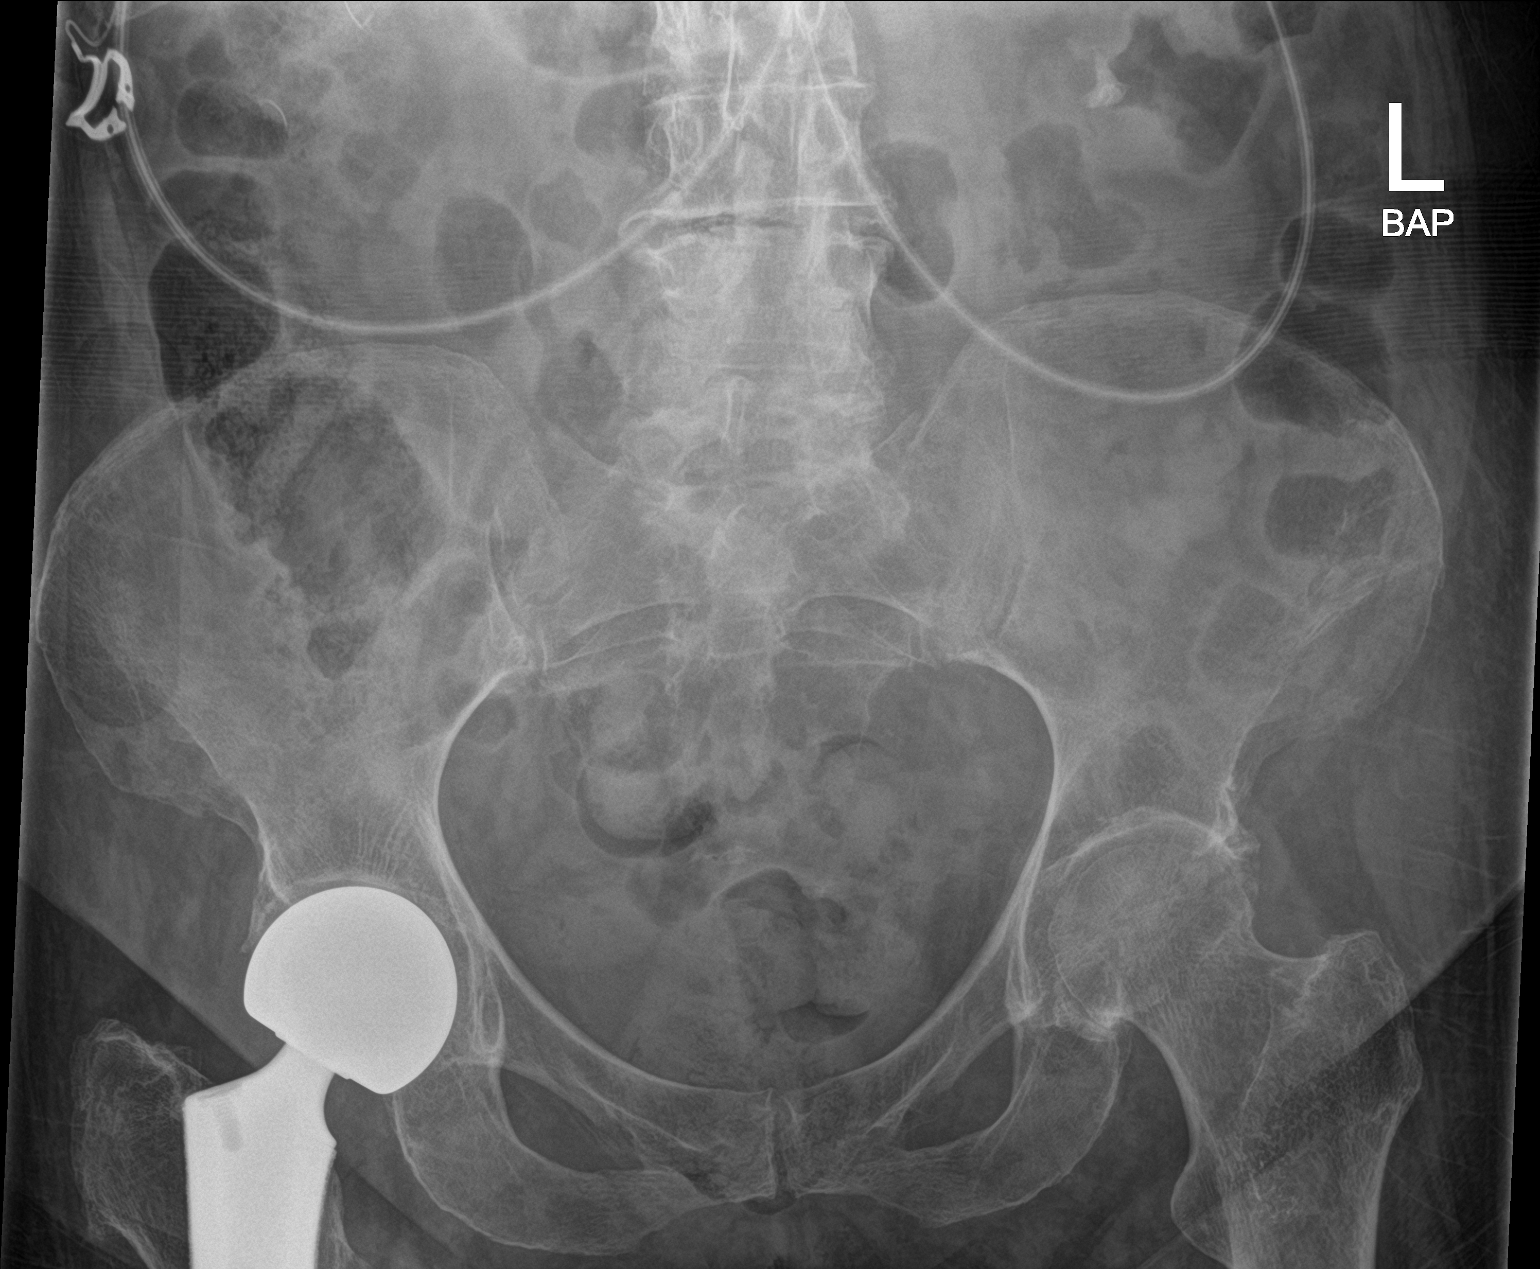

[hip lat (1 of 2)]
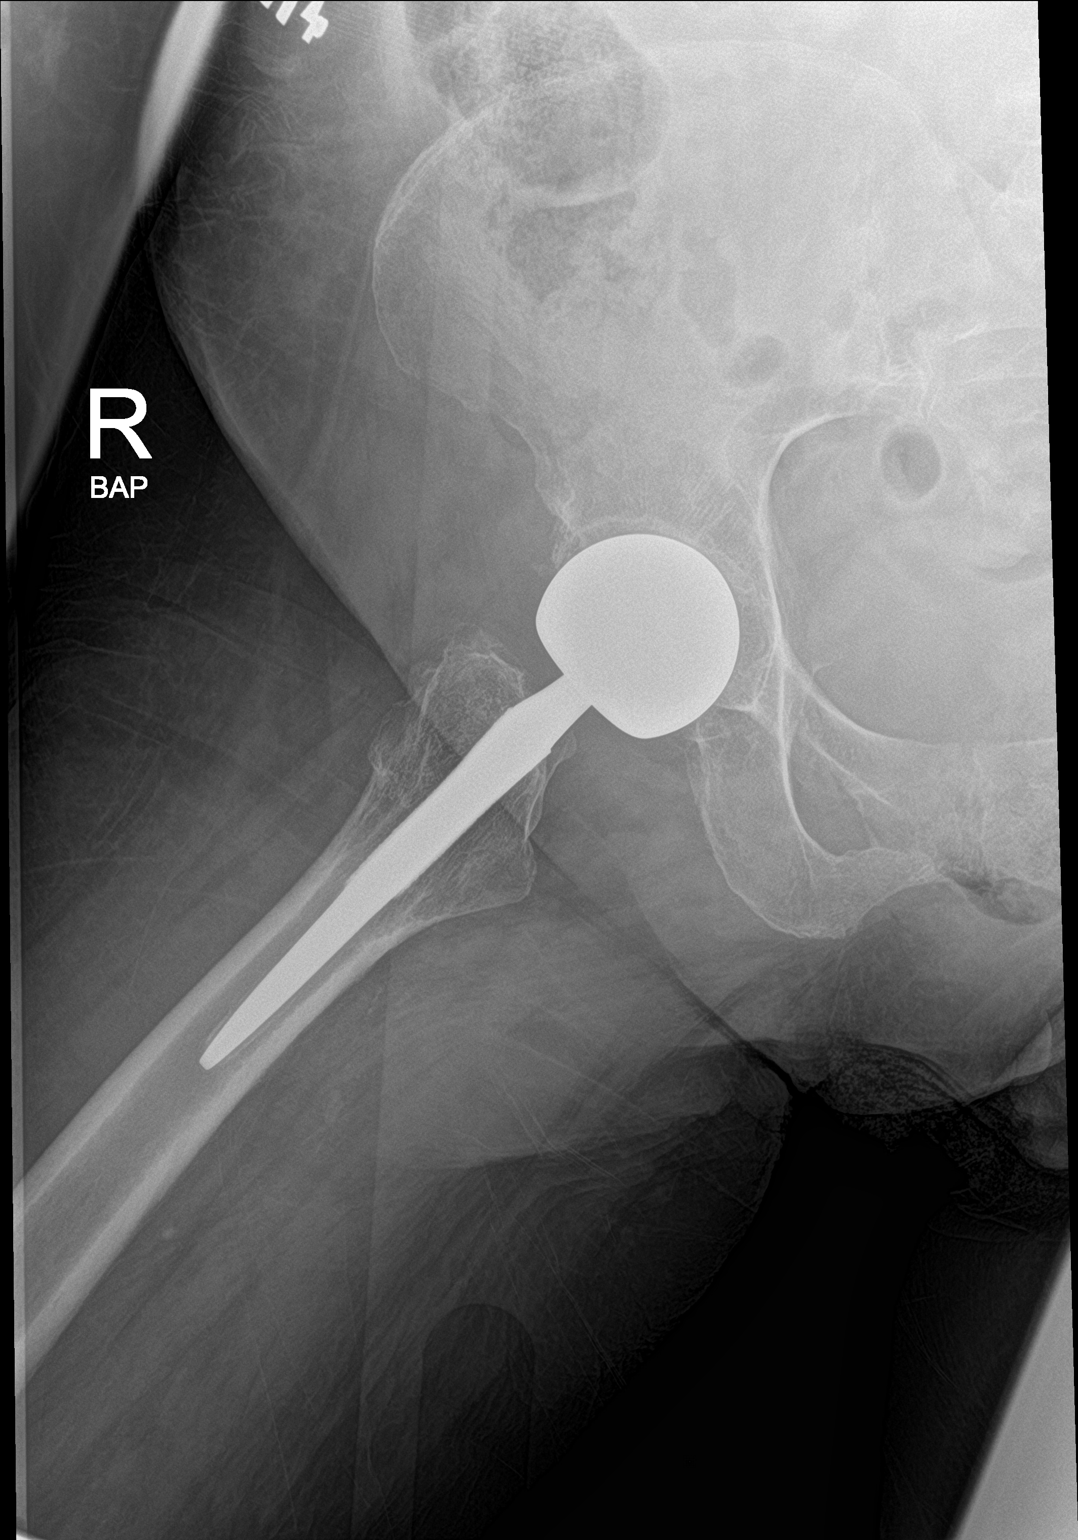

[hip lat (2 of 2)]
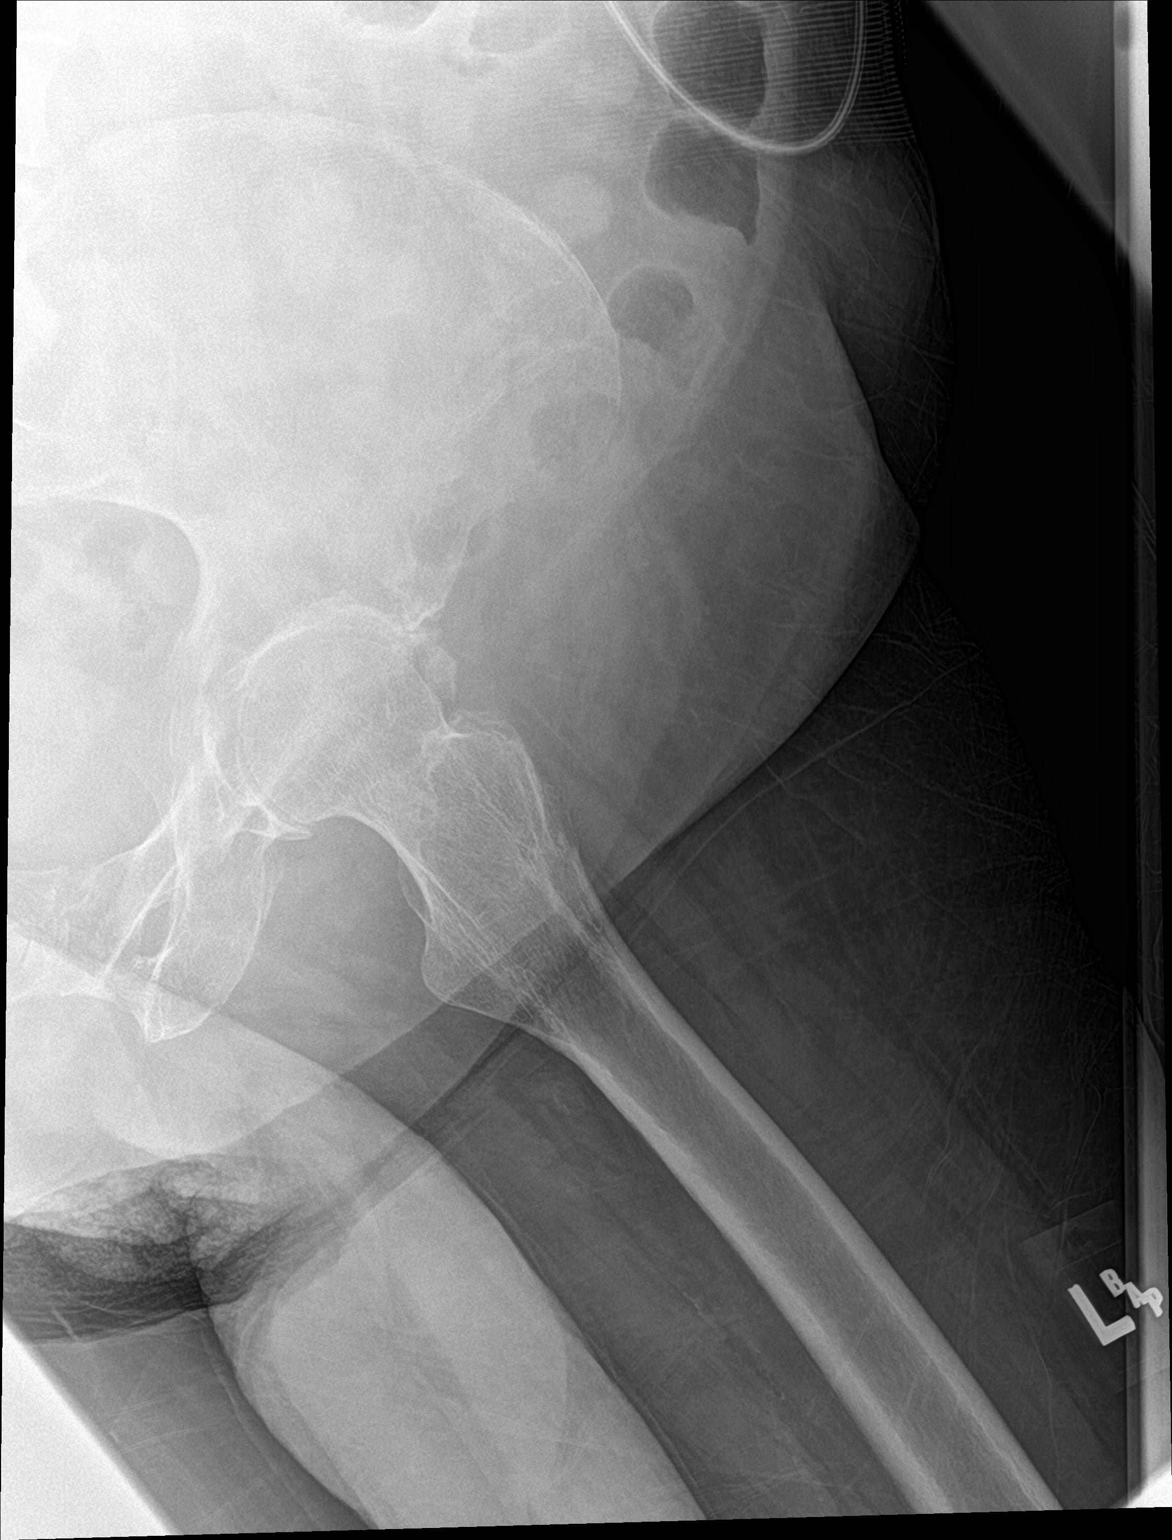

[3 of 3 positions shown; findings below may reference images not displayed]

FINDINGS: Femoral heads are well formed and located. Status post RIGHT hip
hemiarthroplasty. Hip joint spaces are intact. Mild LEFT hip
osteoarthrosis. Sacroiliac joints are symmetric.

No destructive bony lesions. Osteopenia. Included soft tissue planes
are non-suspicious.
IMPRESSION: No acute fracture deformity or dislocation.

If there is high clinical suspicion for occult hip fracture or the
patient refuses to bear weight, consider further evaluation with
MRI. Although CT is expeditious, evidence is lacking regarding
accuracy of CT over plain film radiography.

## 2018-06-12 IMAGING — CR DG LUMBAR SPINE COMPLETE 4+V
5 series · 5 of 5 positions shown · non-contrast
Comparison: Abdominal CT dated 04/17/2016

CLINICAL DATA: [AGE] female with fall and back pain.

EXAM:
LUMBAR SPINE - COMPLETE 4+ VIEW

[l-spine ap]
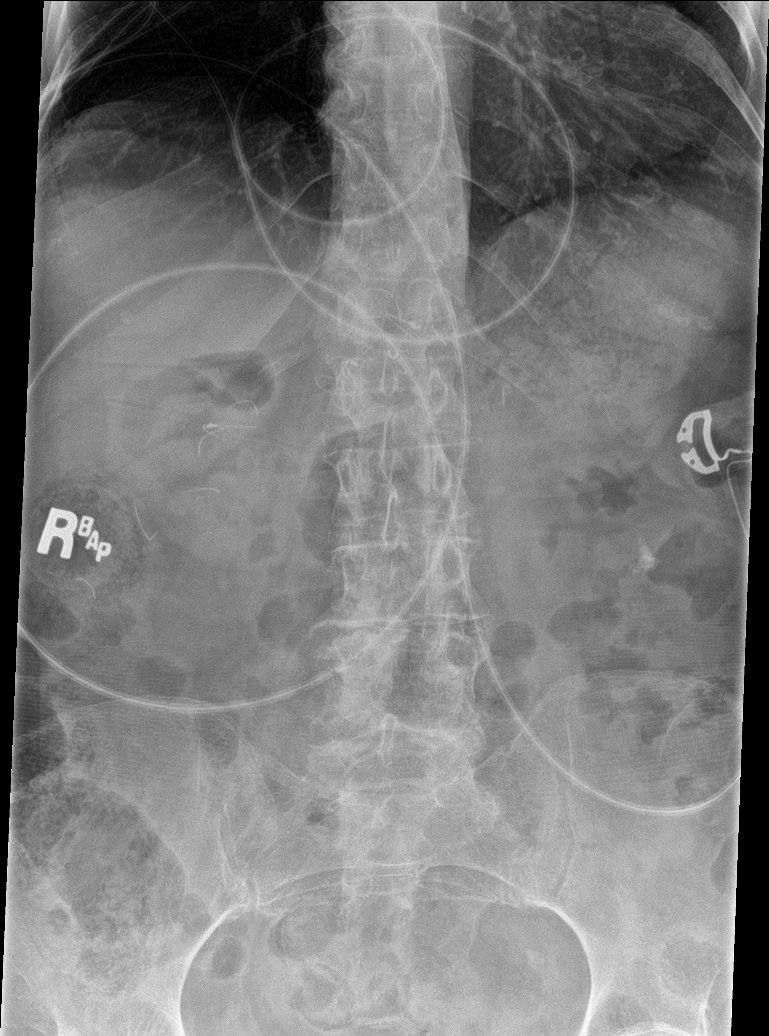

[l-spine obl (1 of 2)]
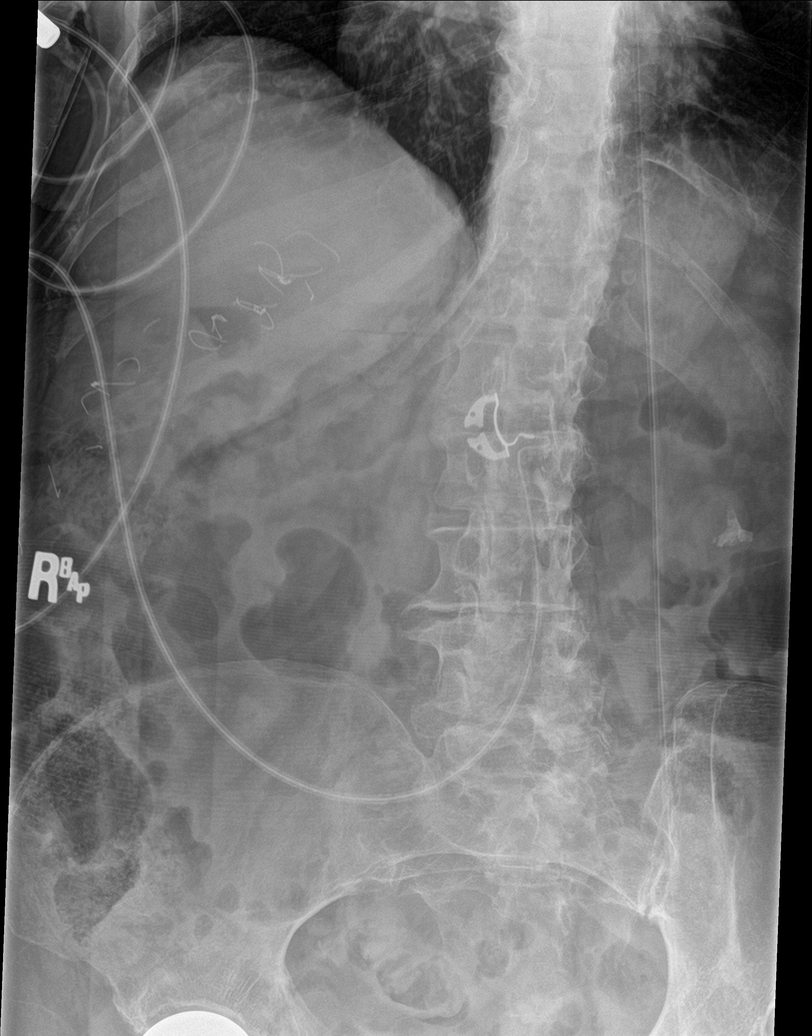

[l-spine obl (2 of 2)]
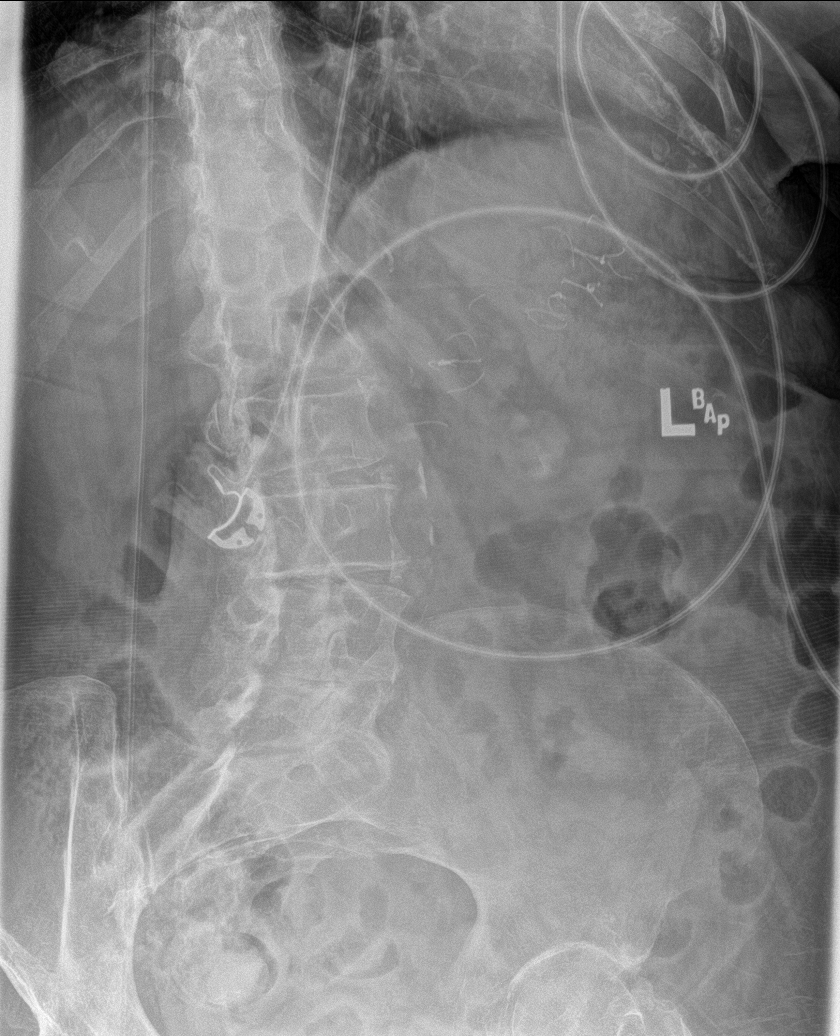

[l-spine lat]
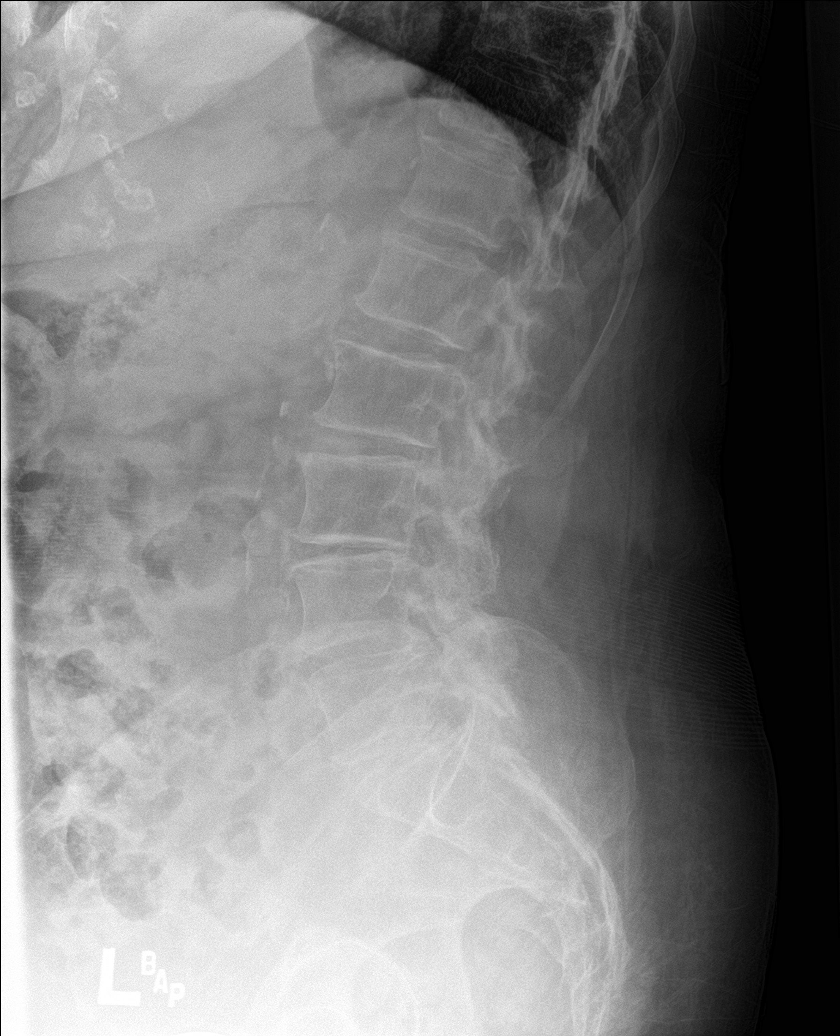

[l-spine spot]
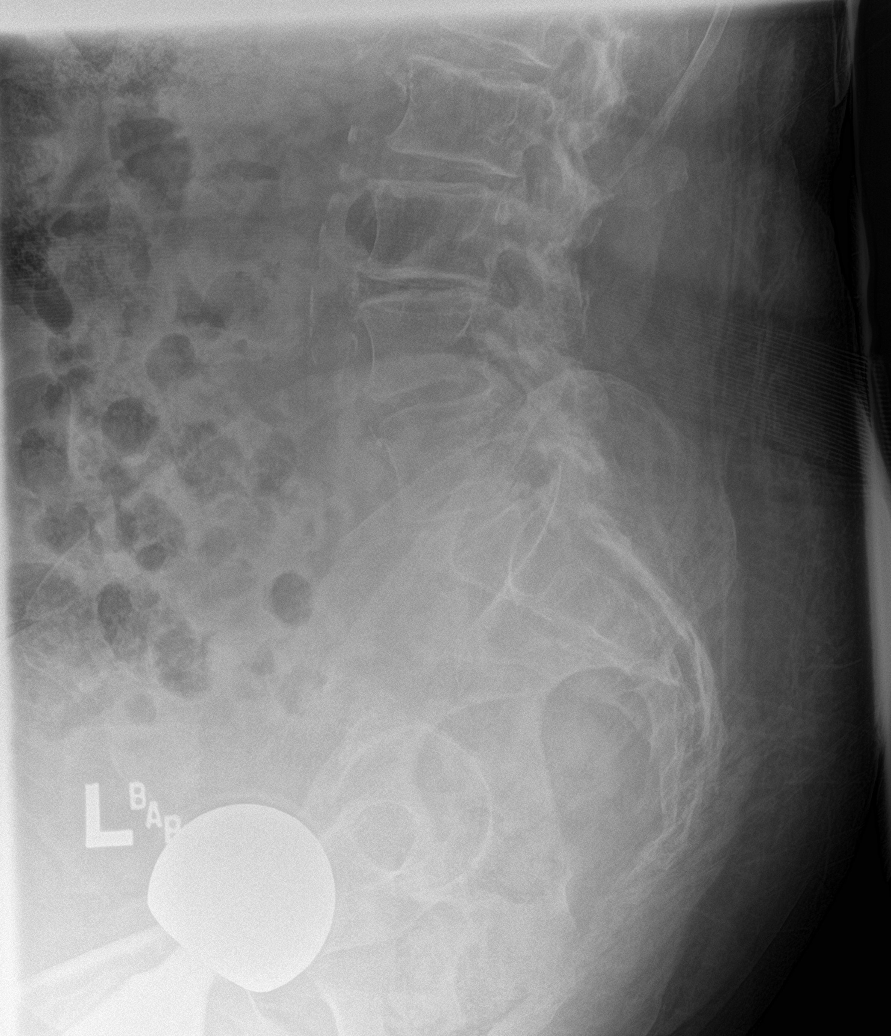

[5 of 5 positions shown; findings below may reference images not displayed]

FINDINGS: There is no acute fracture or subluxation of the lumbar spine.
Evaluation is however limited due to osteopenia. There is
degenerative changes of the spine. Disc desiccation with vacuum
phenomena and L3-L4. Multilevel facet hypertrophy. Partially
visualized right hip arthroplasty.

There is atherosclerotic calcification of the abdominal aorta.
Multiple surgical clips noted in the right hemiabdomen.
IMPRESSION: 1. No acute/traumatic lumbar spine pathology.
2. Osteopenia with degenerative changes of the lumbar spine.

## 2018-06-12 IMAGING — CT CT CERVICAL SPINE W/O CM
5 of 8 series · 12 of 33 positions shown, 13 images · non-contrast
Comparison: 12/16/2016.

CLINICAL DATA: Headache after falling out of a chair in hitting her
head on the ground. History of dementia.

EXAM:
CT HEAD WITHOUT CONTRAST
CT CERVICAL SPINE WITHOUT CONTRAST
TECHNIQUE: Multidetector CT imaging of the head and cervical spine was
performed following the standard protocol without intravenous
contrast. Multiplanar CT image reconstructions of the cervical spine
were also generated.

[Series 5: head bone · axial · 0.42mm/px · z∈[-128,-70]mm · 2 of 88 slices shown]
[im 30/88  bone]
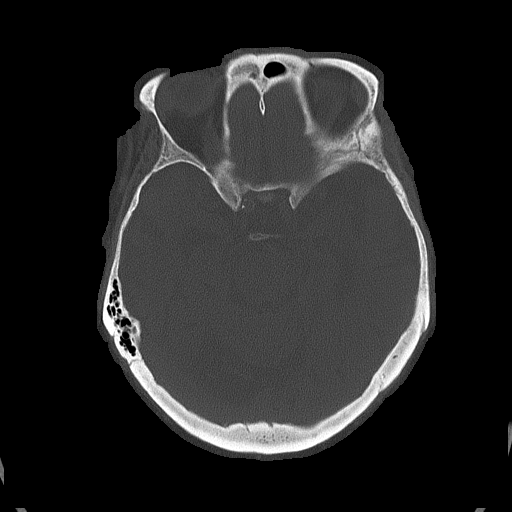
[im 59/88  bone]
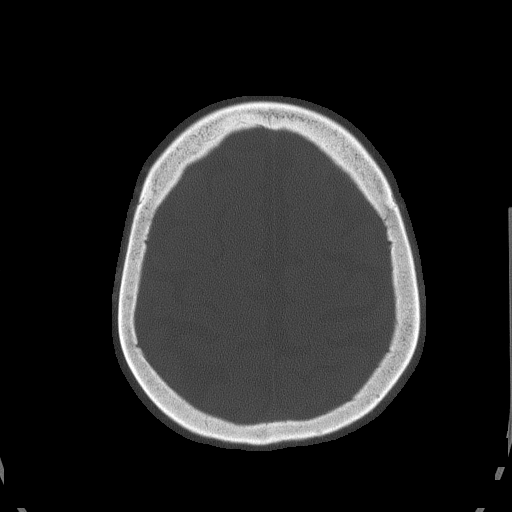

[Series 8: c_spine 2.0 st · axial · 0.30mm/px · z∈[-303,-231]mm · 2 of 108 slices shown, 3 images]
[im 36/108  soft-tissue]
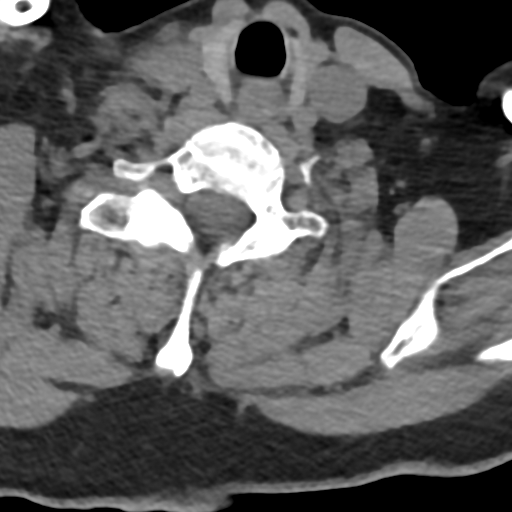
[im 36/108  bone]
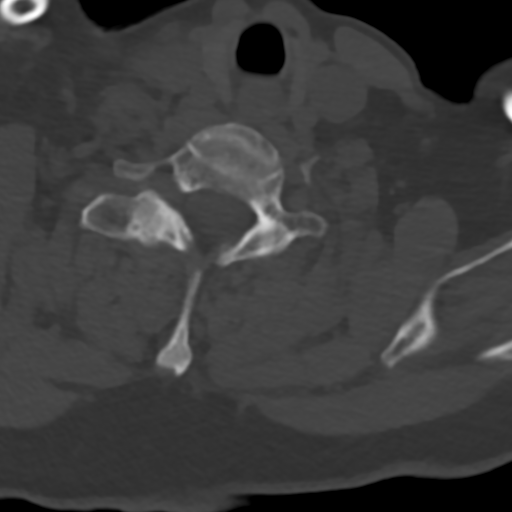
[im 72/108  bone]
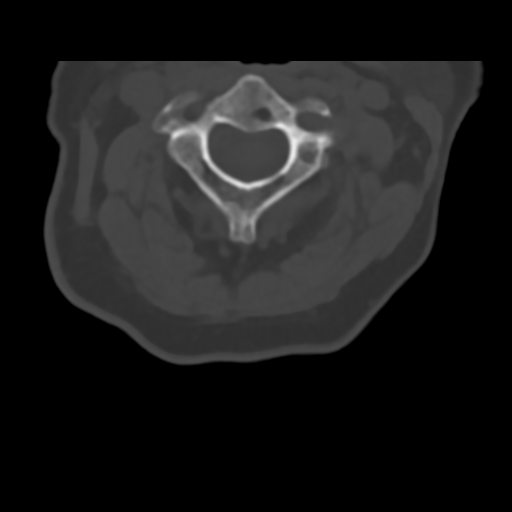

[Series 12: c_spine 2.0 sag bone · sagittal · 0.25mm/px · 5 of 61 slices shown]
[im 11/61  bone]
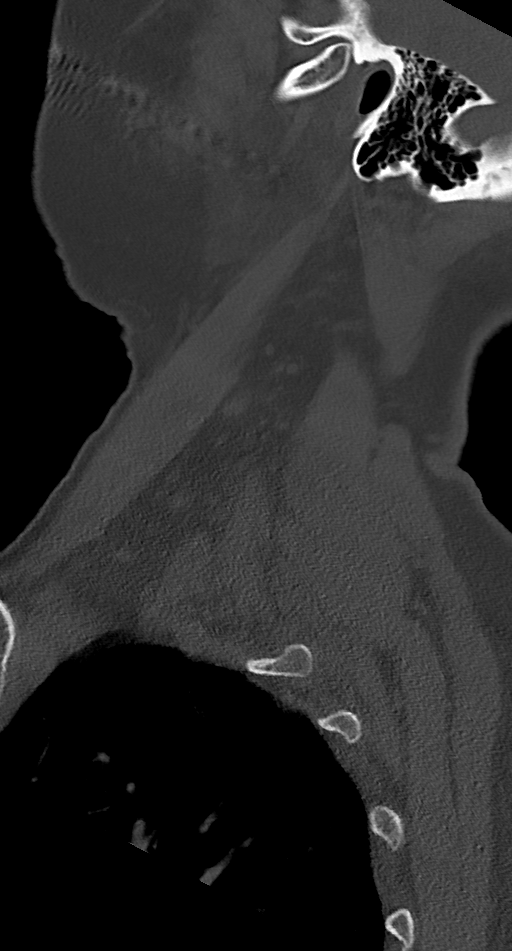
[im 21/61  bone]
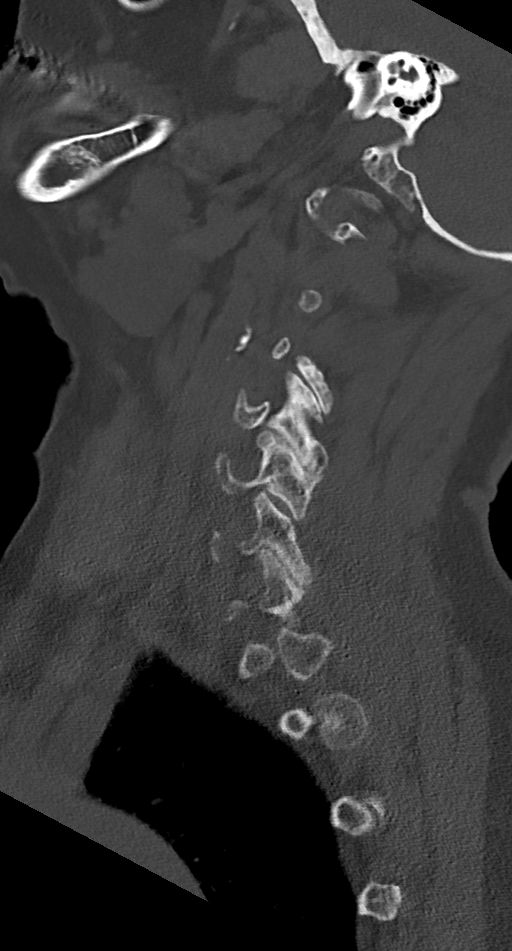
[im 31/61  bone]
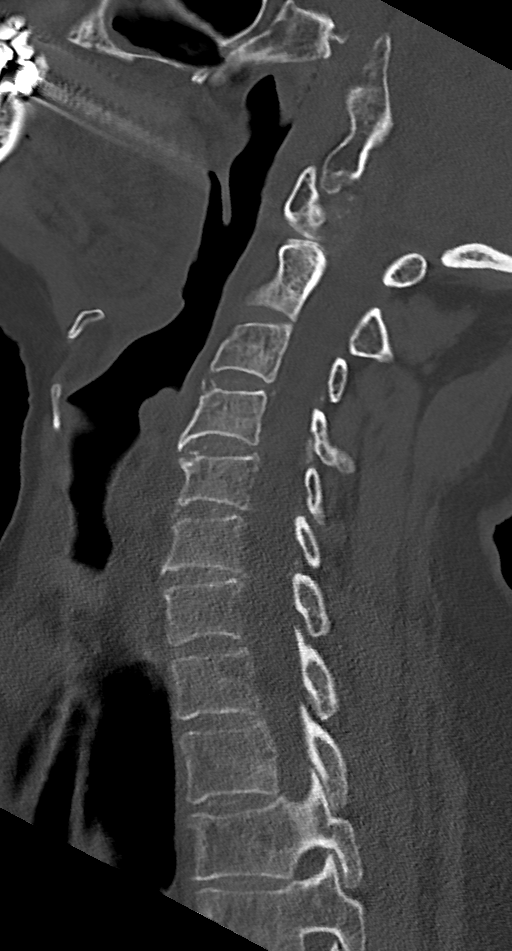
[im 41/61  bone]
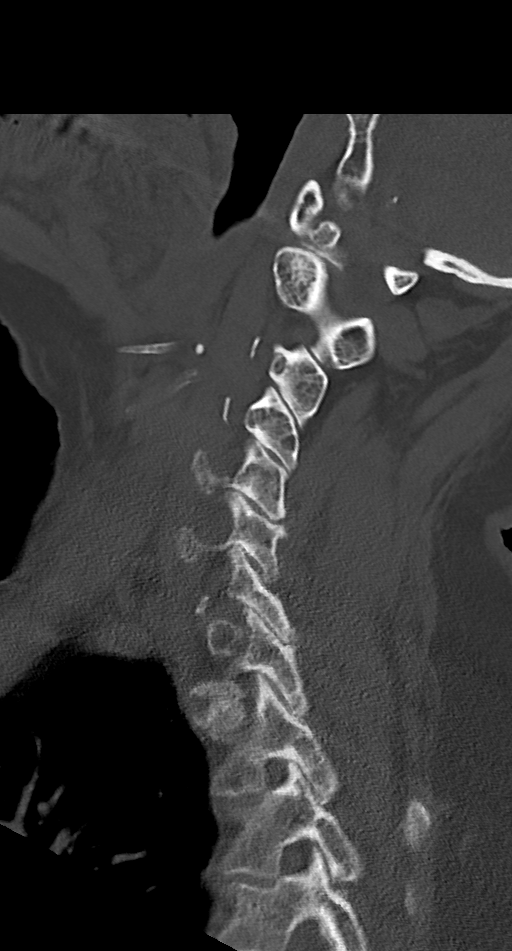
[im 51/61  bone]
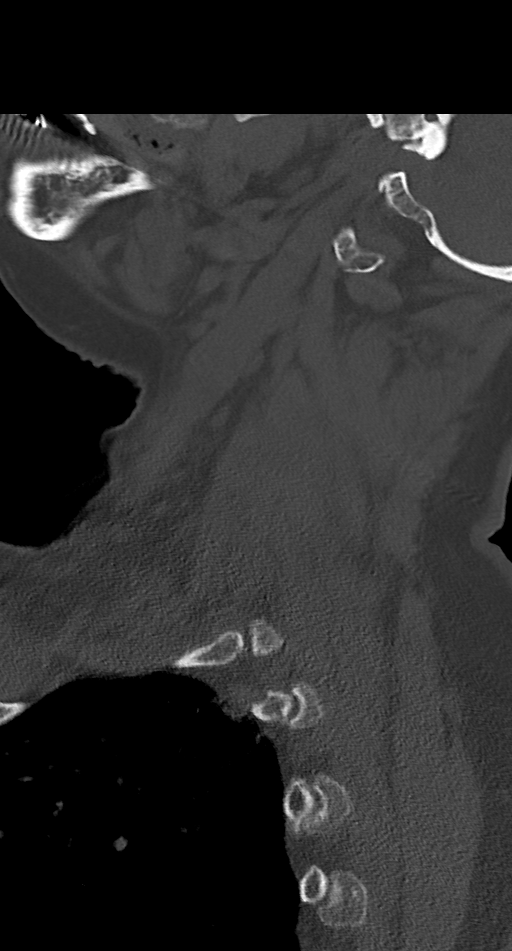

[Series 13: c_spine 2.0 cor bone · coronal · 0.25mm/px · 1 of 61 slices shown]
[im 31/61  bone]
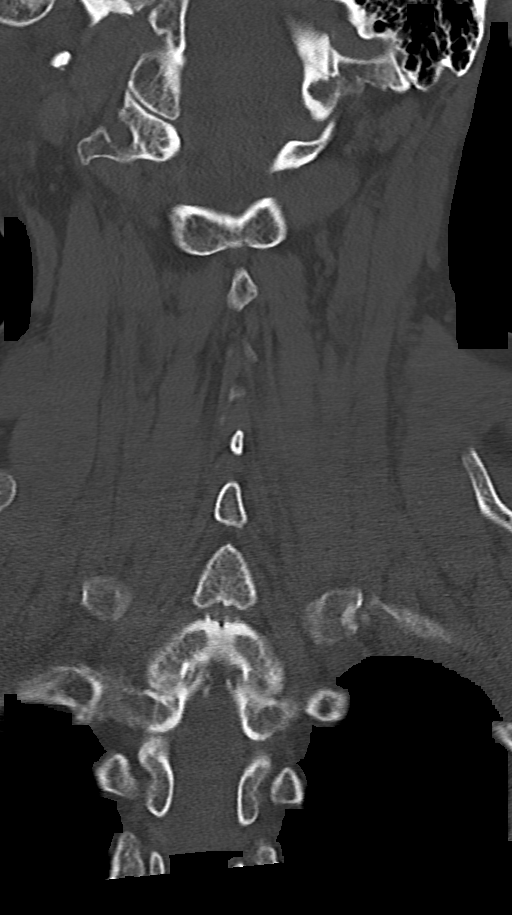

[Series 14: c_spine 2.0 orthogonals · axial · 0.21mm/px · z∈[-325,-265]mm · 2 of 109 slices shown]
[im 37/109  bone]
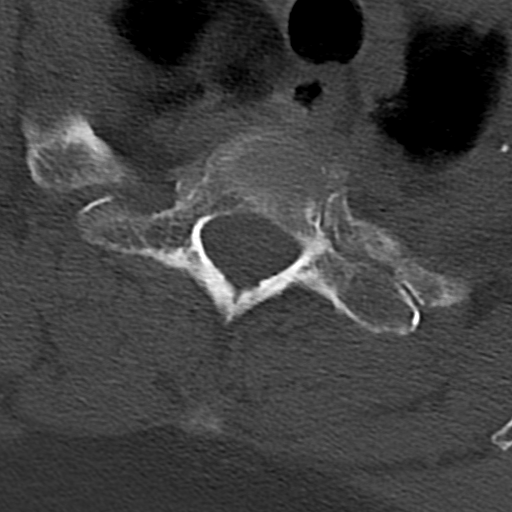
[im 73/109  bone]
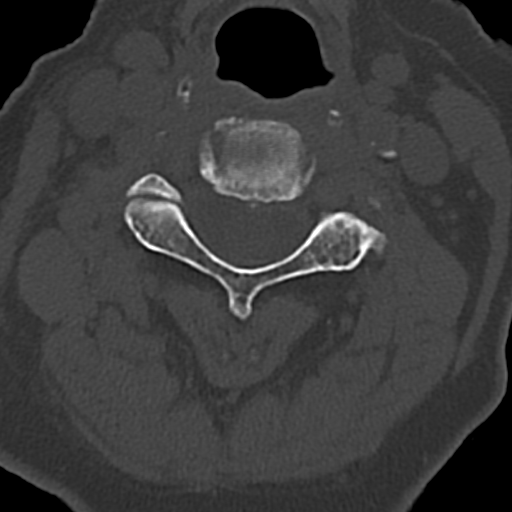

[12 of 33 positions shown; findings below may reference images not displayed]

FINDINGS: CT HEAD FINDINGS

Brain: Diffusely enlarged ventricles and subarachnoid spaces. Patchy
white matter low density in both cerebral hemispheres. No
intracranial hemorrhage, mass lesion or CT evidence of acute
infarction.

Vascular: No hyperdense vessel or unexpected calcification.

Skull: Normal. Negative for fracture or focal lesion.

Sinuses/Orbits: Small amount of fluid in the left sphenoid sinus and
small left sphenoid sinus retention cyst. Status post bilateral
cataract extraction.

Other: None.

CT CERVICAL SPINE FINDINGS

Alignment: Normal.

Skull base and vertebrae: No acute fracture. No primary bone lesion
or focal pathologic process.

Soft tissues and spinal canal: No prevertebral fluid or swelling. No
visible canal hematoma.

Disc levels:  Mild multilevel degenerative changes.

Upper chest: Clear lung apices.

Other: Bilateral carotid artery calcifications.
IMPRESSION: 1. No skull fracture or intracranial hemorrhage.
2. No cervical spine fracture or subluxation.
3. Stable diffuse cerebral and cerebellar atrophy and chronic small
vessel white matter ischemic changes in both cerebral hemispheres.
4. Stable mild multilevel cervical spine degenerative changes.
5. Bilateral carotid artery atheromatous calcifications.

## 2018-06-13 IMAGING — CT CT PELVIS W/O CM
2 of 3 series · 16 of 46 positions shown, 18 images · non-contrast
Comparison: CT and MRI of the left hip performed 09/12/2016, and
pelvic radiograph performed 10/04/2016

CLINICAL DATA: Status post fall out of chair, with concern for
pelvic injury. Initial encounter.

EXAM:
CT PELVIS WITHOUT CONTRAST
TECHNIQUE: Multidetector CT imaging of the pelvis was performed following the
standard protocol without intravenous contrast.

[Series 7: pelvis w/o 2.0 cor · coronal · non-contrast · 0.59mm/px · 3 of 146 slices shown]
[im 49/146  soft-tissue]
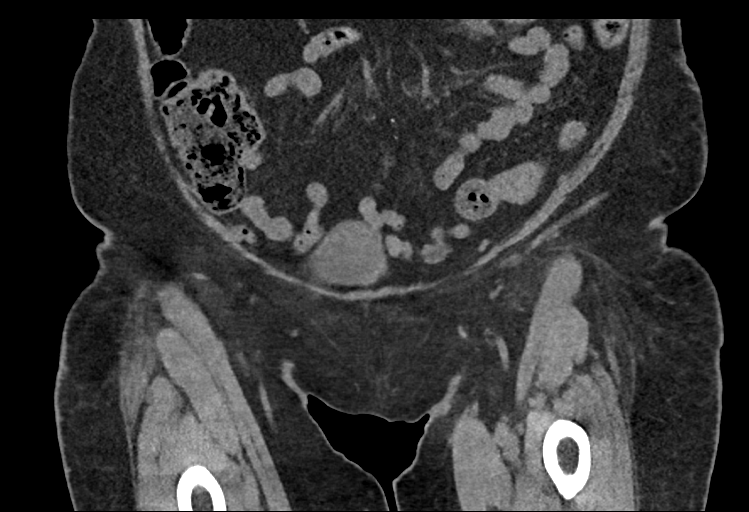
[im 65/146  soft-tissue]
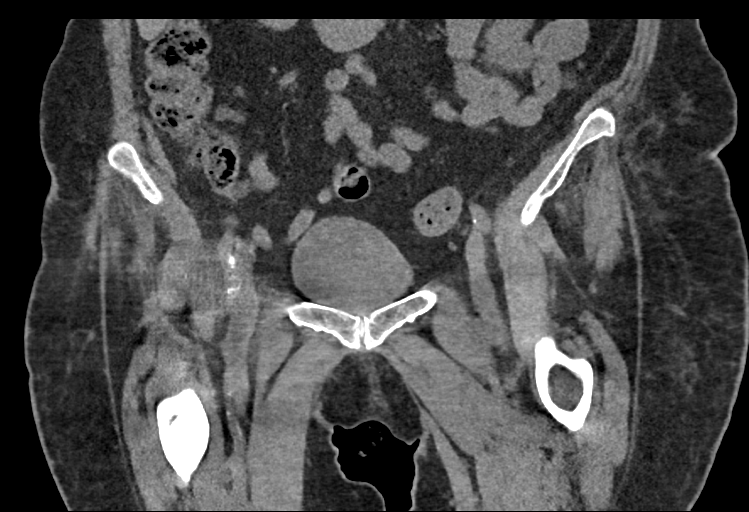
[im 81/146  soft-tissue]
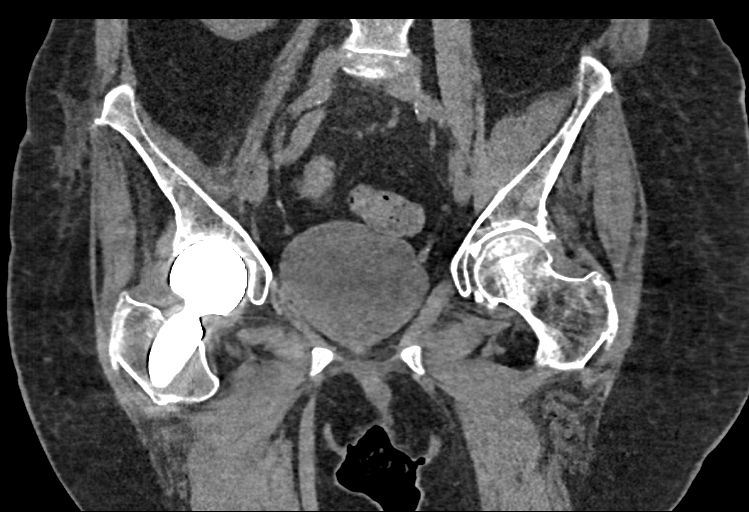

[Series 9: pelvis w/o 5.0 (person_name) · axial · non-contrast · 0.98mm/px · z∈[+750,+1000]mm · 13 of 58 slices shown, 15 images]
[im 4/58  soft-tissue]
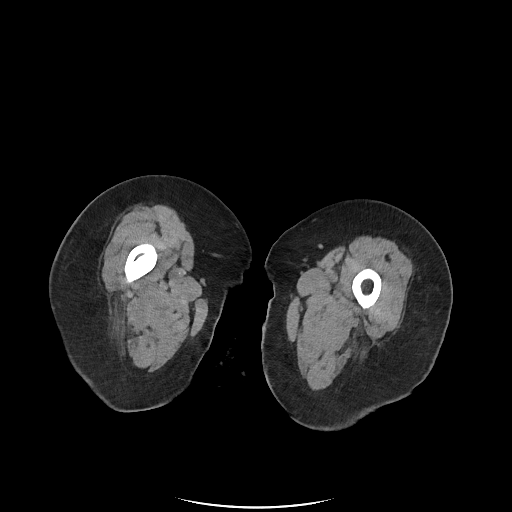
[im 4/58  bone]
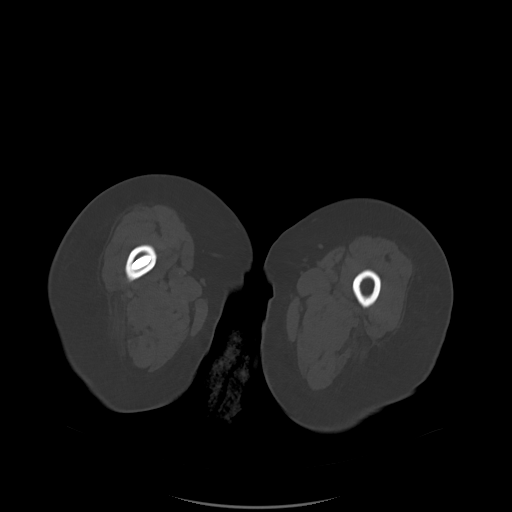
[im 8/58  soft-tissue]
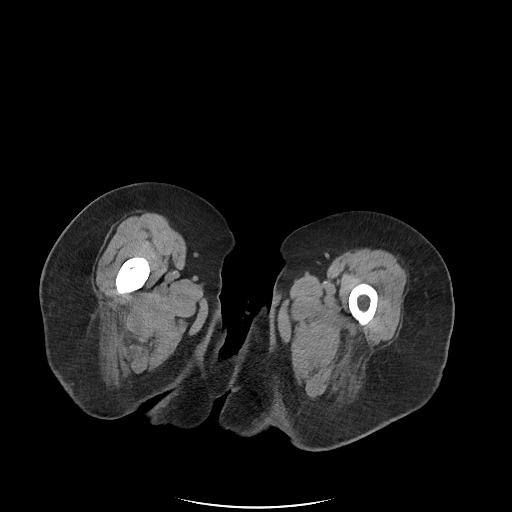
[im 12/58  soft-tissue]
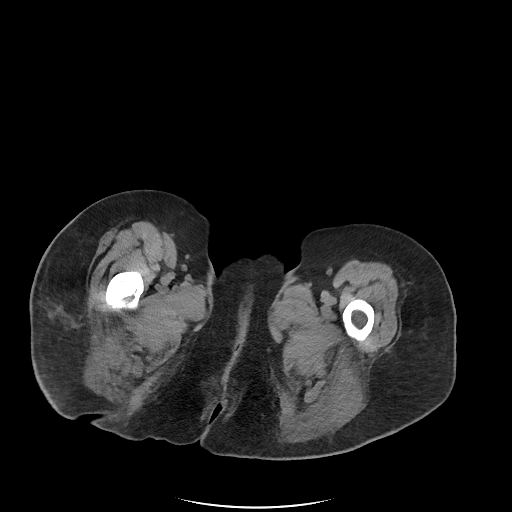
[im 17/58  soft-tissue]
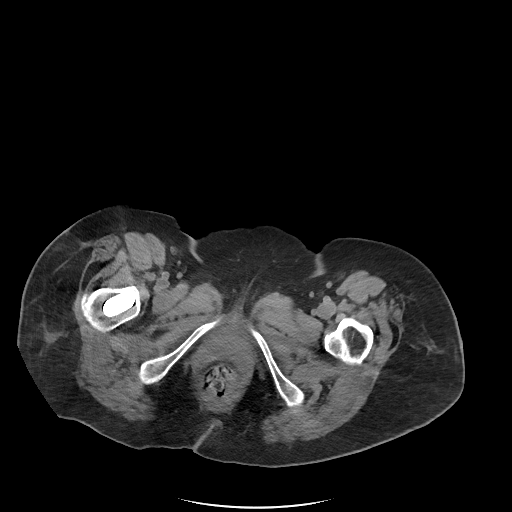
[im 21/58  soft-tissue]
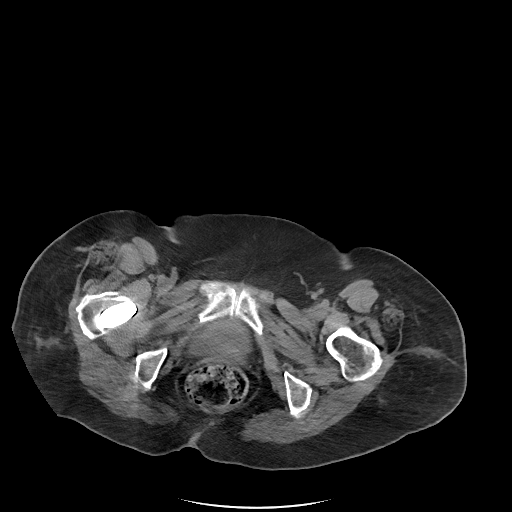
[im 24/58  soft-tissue]
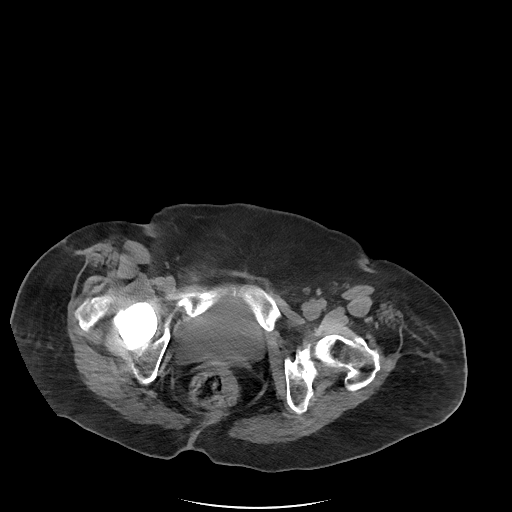
[im 30/58  soft-tissue]
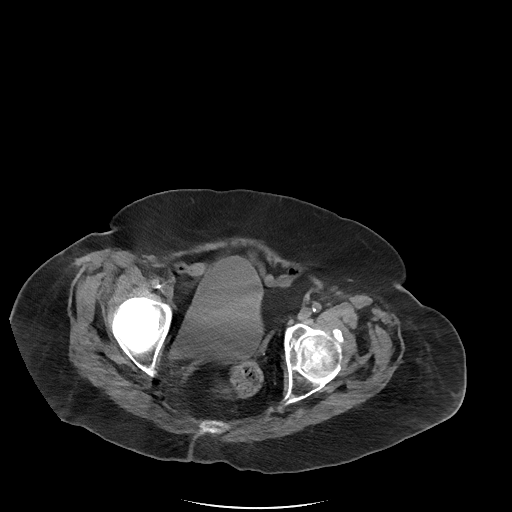
[im 34/58  soft-tissue]
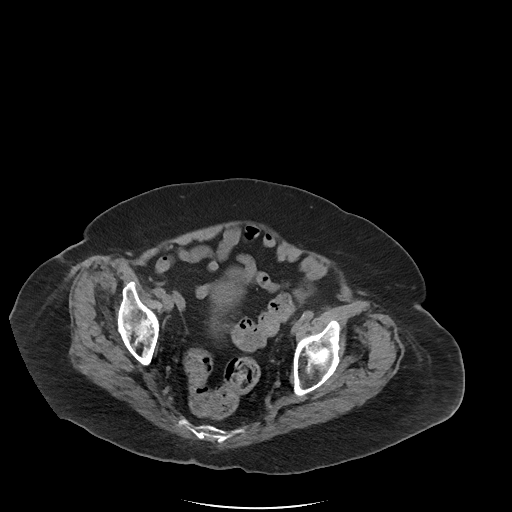
[im 37/58  soft-tissue]
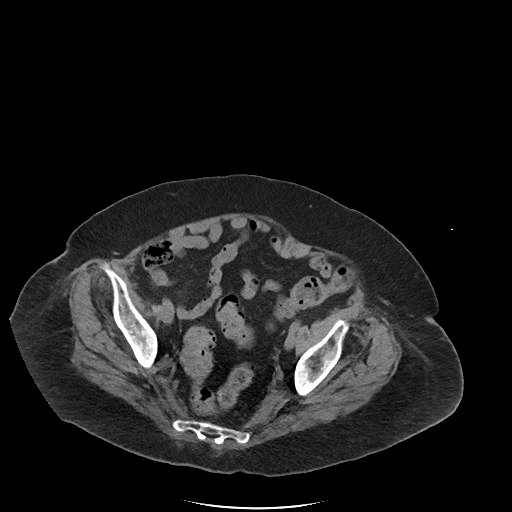
[im 37/58  bone]
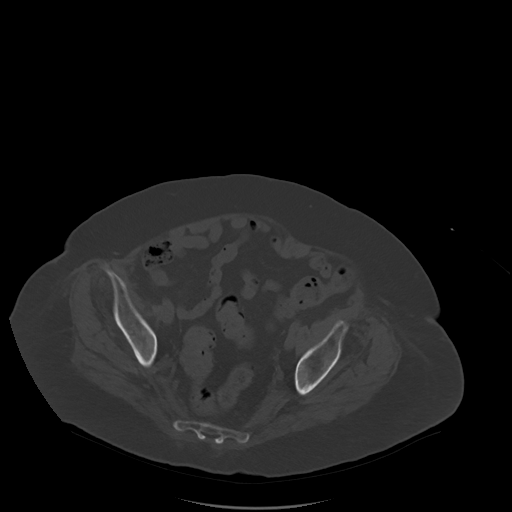
[im 41/58  soft-tissue]
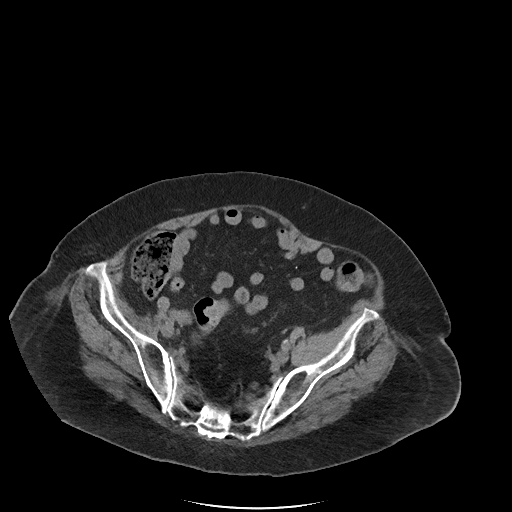
[im 46/58  soft-tissue]
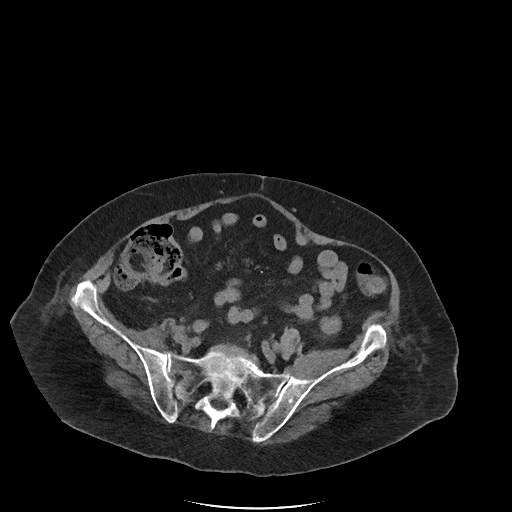
[im 50/58  soft-tissue]
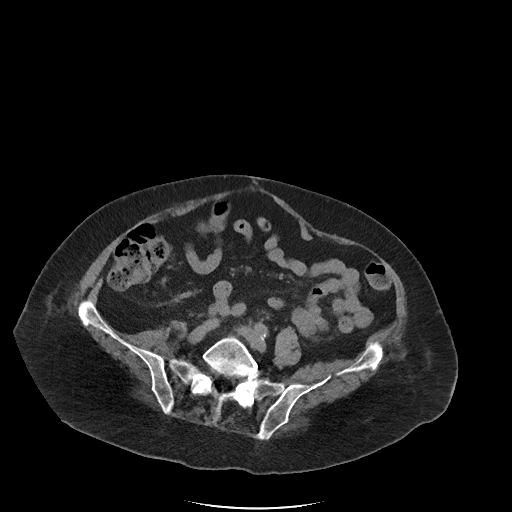
[im 54/58  soft-tissue]
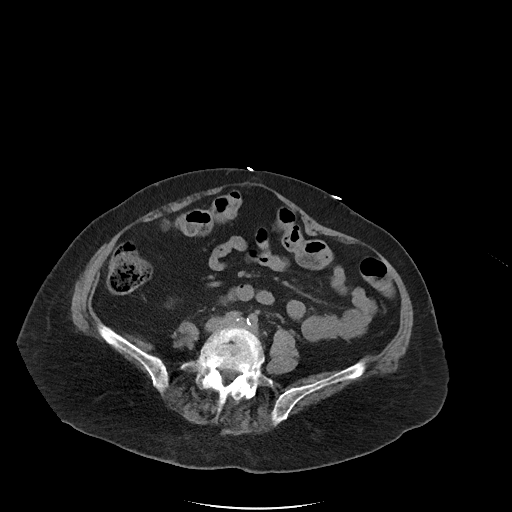

[16 of 46 positions shown; findings below may reference images not displayed]

FINDINGS: Urinary Tract: The visualized portions of the right kidney are
unremarkable. The bladder is mildly distended and unremarkable in
appearance.

Bowel: Visualized small and large bowel loops are grossly
unremarkable in appearance.

Vascular/Lymphatic: Scattered calcification is seen at the distal
abdominal aorta and its branches. No retroperitoneal or pelvic
sidewall lymphadenopathy is seen.

Reproductive: The patient is status post hysterectomy. No suspicious
adnexal masses are seen.

Other: Mild soft tissue injury is suggested overlying both hips.
Some of this may reflect chronic soft tissue scarring.

Musculoskeletal: There is no evidence of acute fracture or
dislocation. Cortical irregularity along the left iliac wing likely
reflects healed fracture. The patient's right hip arthroplasty is
grossly unremarkable in appearance, without evidence of loosening or
periprosthetic fracture. The sacroiliac joints are grossly
unremarkable.
IMPRESSION: 1. No evidence of acute fracture or dislocation. Right hip
arthroplasty is unremarkable in appearance.
2. Suggestion of mild soft tissue injury overlying both hips.
3. Scattered aortic atherosclerosis.

## 2018-06-14 IMAGING — CT CT HEAD W/O CM
4 series · 17 of 47 positions shown, 19 images · non-contrast
Comparison: 03/25/2017

CLINICAL DATA: Recent fall, altered level of consciousness

EXAM:
CT HEAD WITHOUT CONTRAST
TECHNIQUE: Contiguous axial images were obtained from the base of the skull
through the vertex without intravenous contrast.

[Series 3: head without · axial · non-contrast · 0.44mm/px · z∈[-104,+21]mm · 7 of 35 slices shown, 9 images]
[im 5/35  brain]
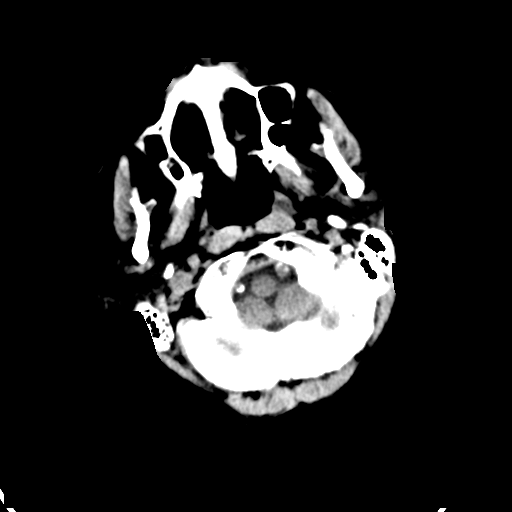
[im 5/35  bone]
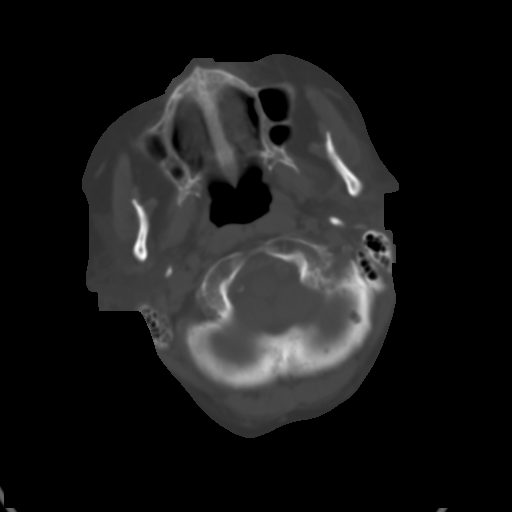
[im 9/35  brain]
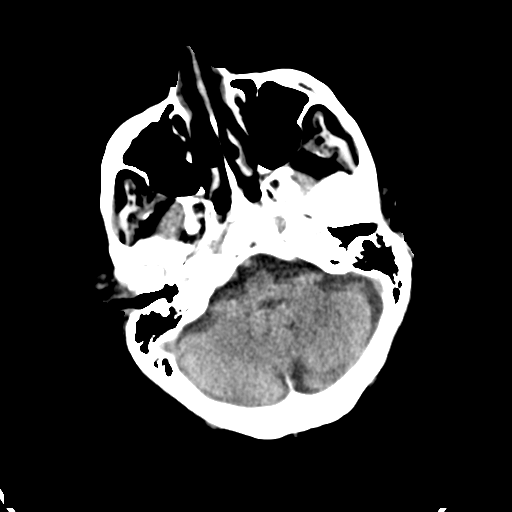
[im 13/35  brain]
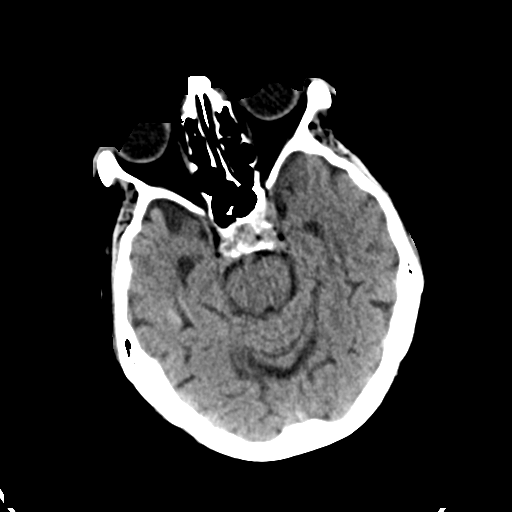
[im 18/35  brain]
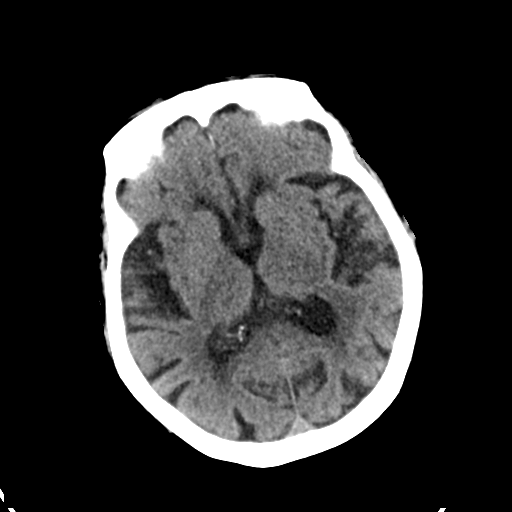
[im 22/35  brain]
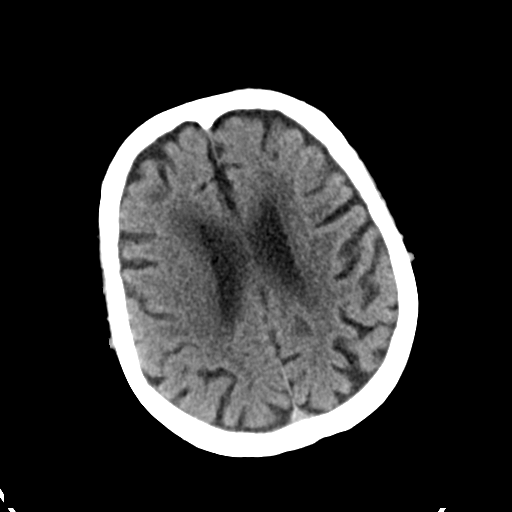
[im 22/35  bone]
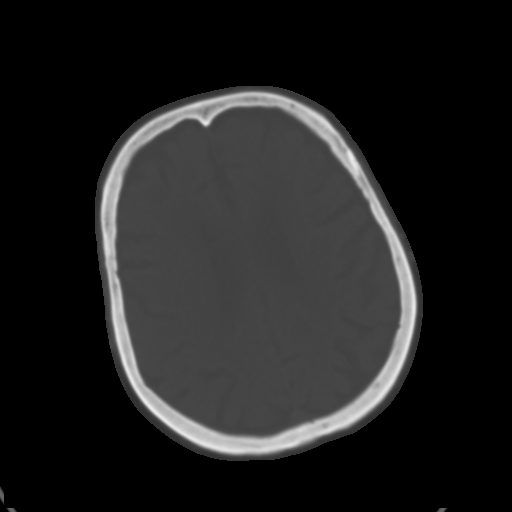
[im 26/35  brain]
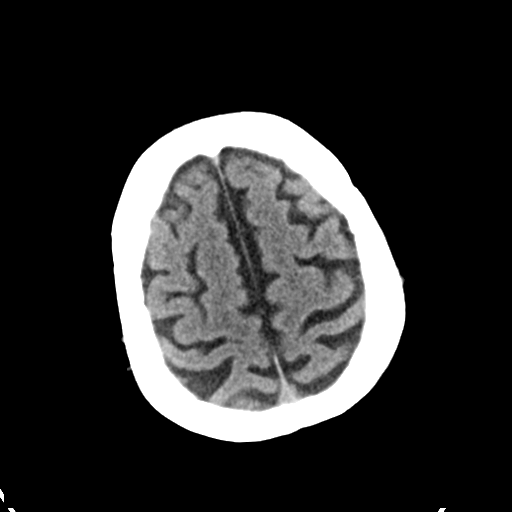
[im 30/35  brain]
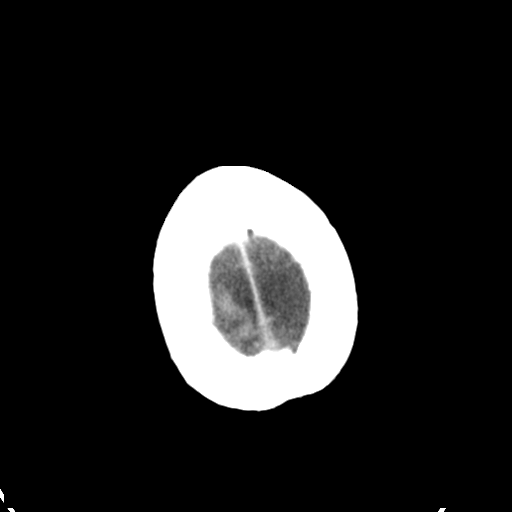

[Series 4: head bone · axial · 0.44mm/px · z∈[-108,-46]mm · 4 of 88 slices shown]
[im 9/88  bone]
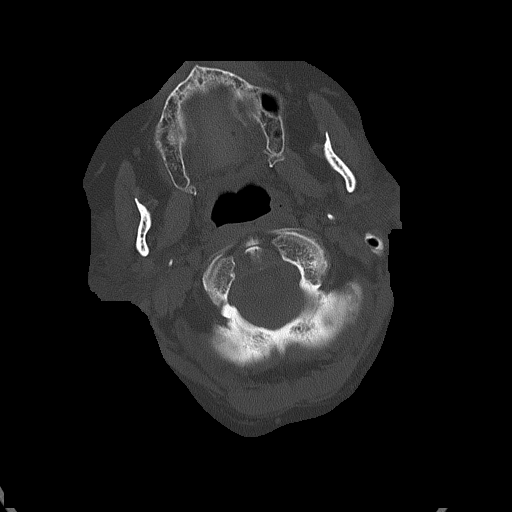
[im 18/88  bone]
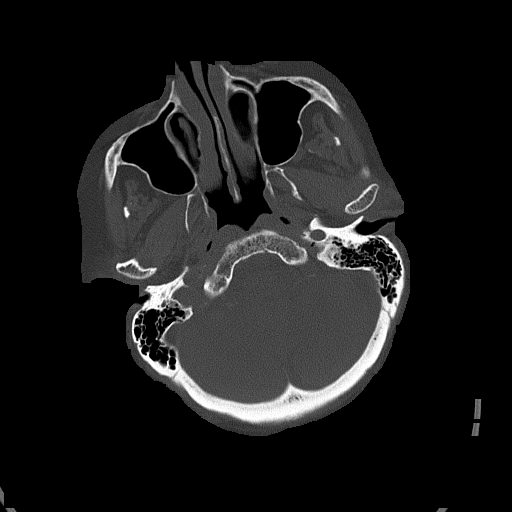
[im 27/88  bone]
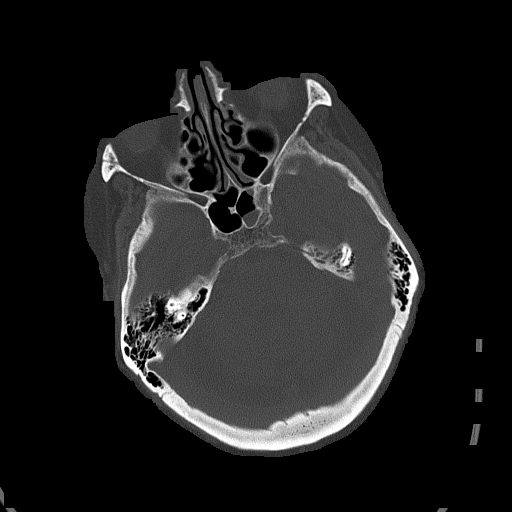
[im 40/88  bone]
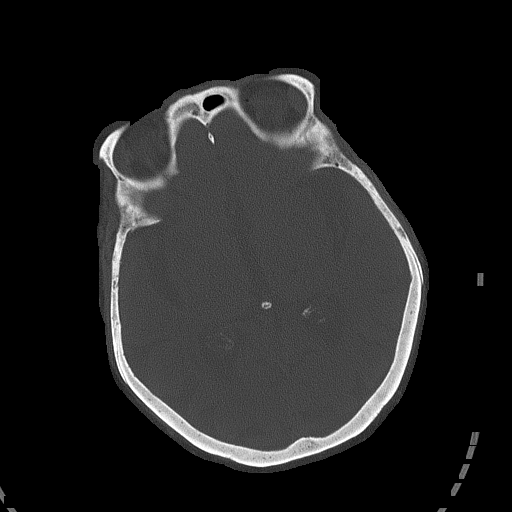

[Series 5: head without cor · coronal · non-contrast · 0.32mm/px · 3 of 95 slices shown]
[im 24/95  brain]
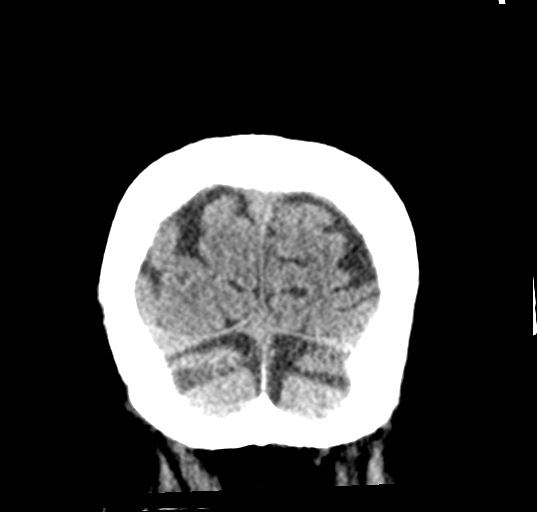
[im 33/95  brain]
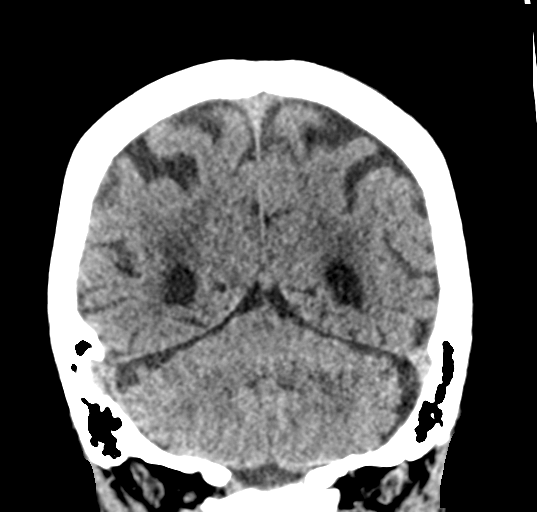
[im 43/95  brain]
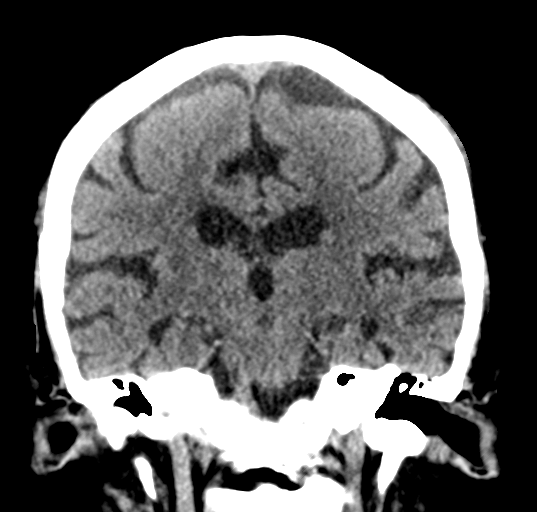

[Series 6: head without sag · sagittal · non-contrast · 0.33mm/px · 3 of 55 slices shown]
[im 19/55  brain]
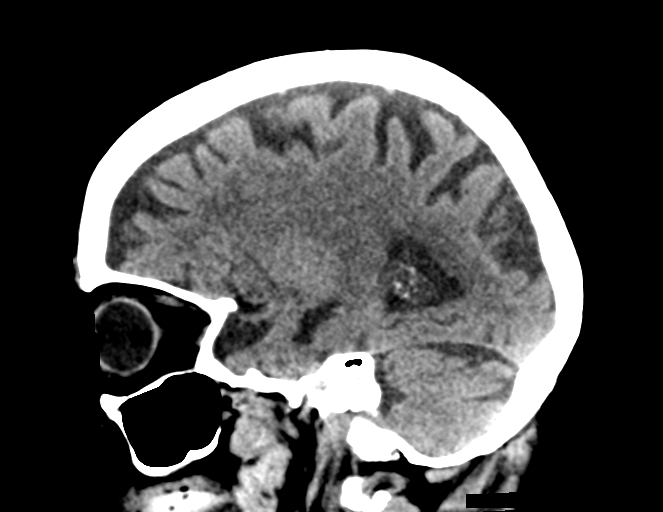
[im 28/55  brain]
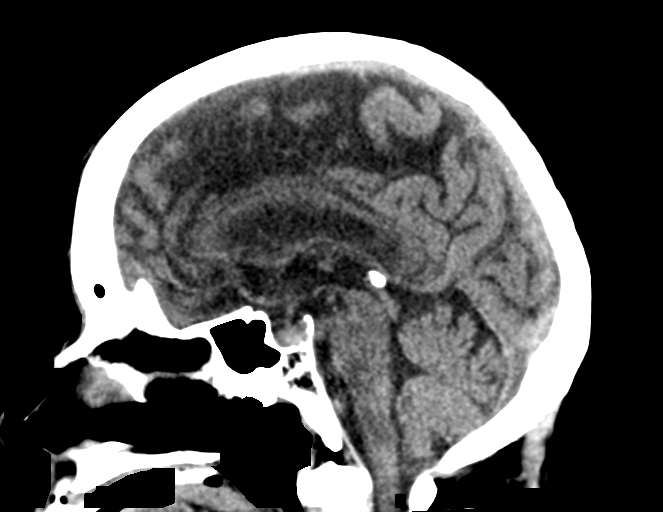
[im 37/55  brain]
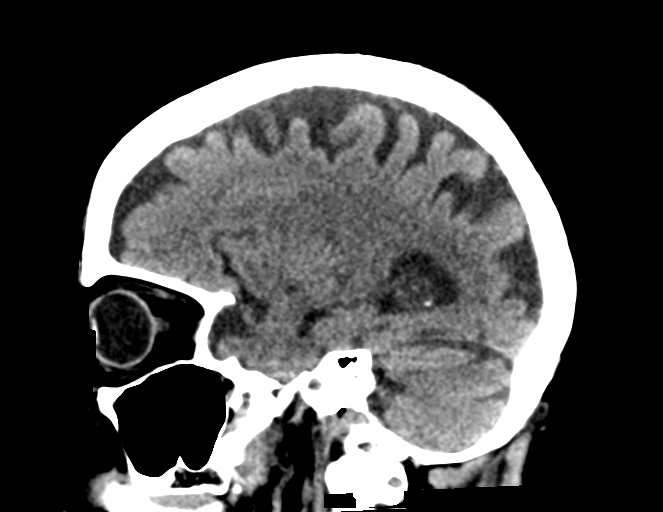

[17 of 47 positions shown; findings below may reference images not displayed]

FINDINGS: Brain: Mild atrophic and ischemic changes are noted similar to that
seen on the prior exam. No findings to suggest acute hemorrhage,
acute infarction or space-occupying mass lesion is noted.

Vascular: No hyperdense vessel or unexpected calcification.

Skull: Normal. Negative for fracture or focal lesion.

Sinuses/Orbits: No acute finding.

Other: None
IMPRESSION: Chronic atrophic and ischemic changes without acute abnormality.

## 2018-07-01 IMAGING — CT CT HEAD W/O CM
4 series · 16 of 47 positions shown, 18 images · non-contrast
Comparison: CT scan of March 27, 2017.

CLINICAL DATA: Altered level of consciousness.

EXAM:
CT HEAD WITHOUT CONTRAST
TECHNIQUE: Contiguous axial images were obtained from the base of the skull
through the vertex without intravenous contrast.

[Series 3: head without · axial · non-contrast · 0.40mm/px · z∈[-74,+46]mm · 7 of 33 slices shown, 9 images]
[im 5/33  brain]
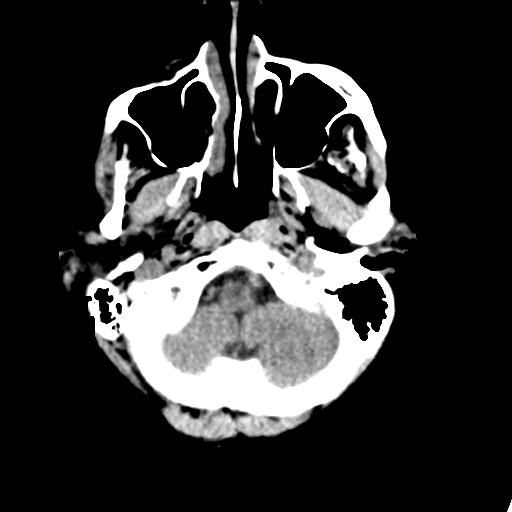
[im 5/33  bone]
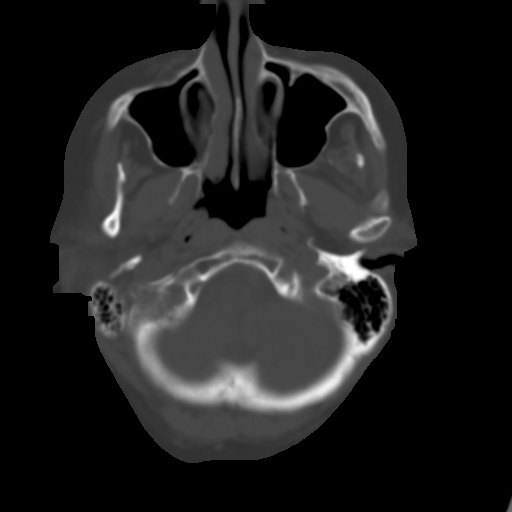
[im 9/33  brain]
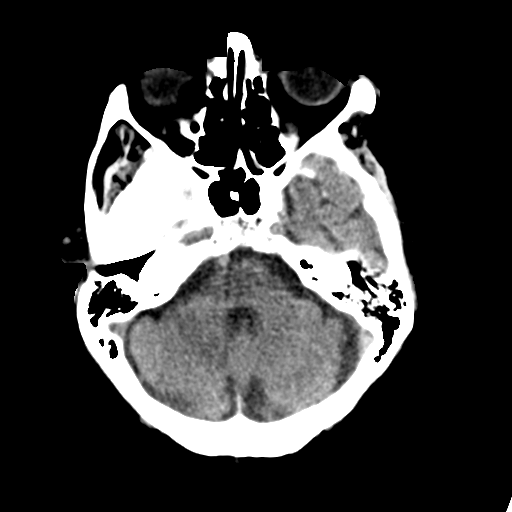
[im 13/33  brain]
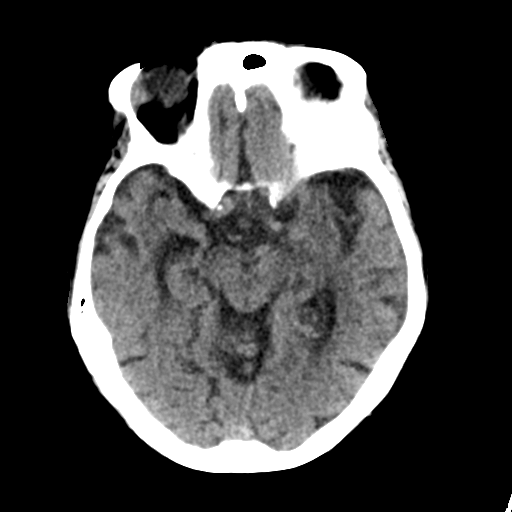
[im 17/33  brain]
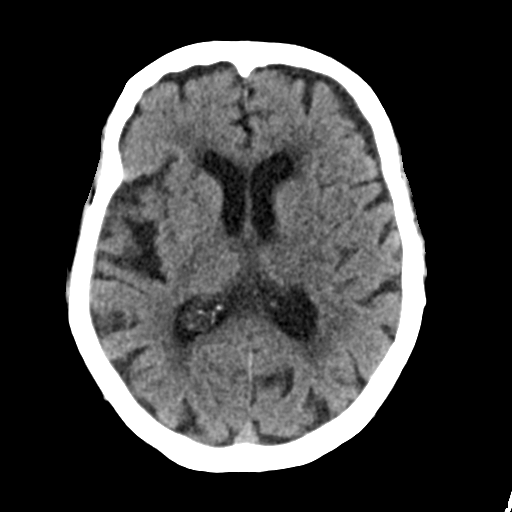
[im 21/33  brain]
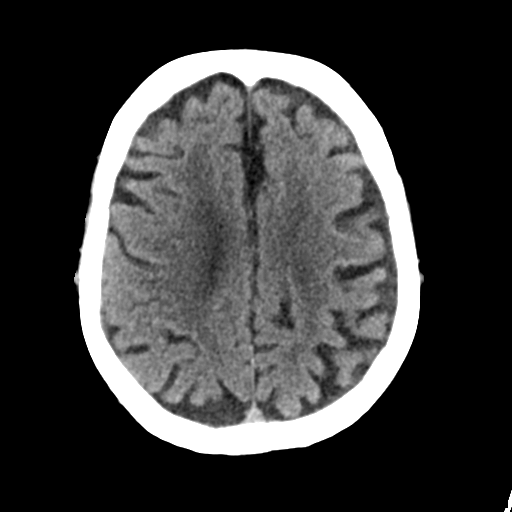
[im 21/33  bone]
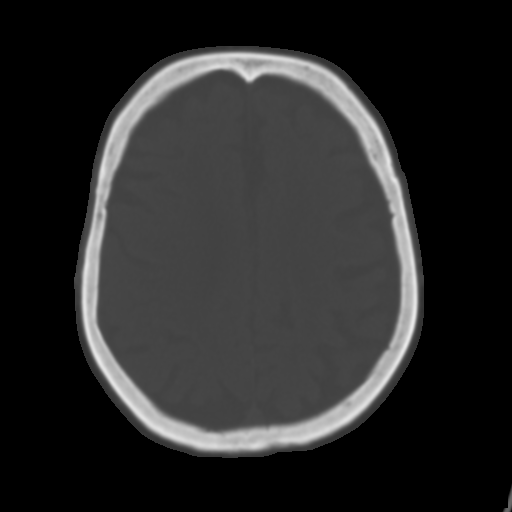
[im 25/33  brain]
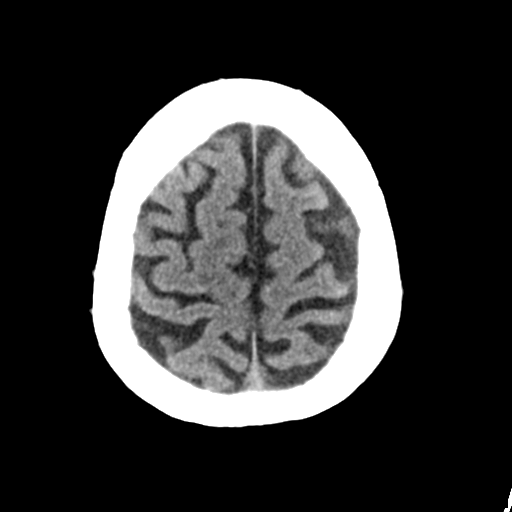
[im 29/33  brain]
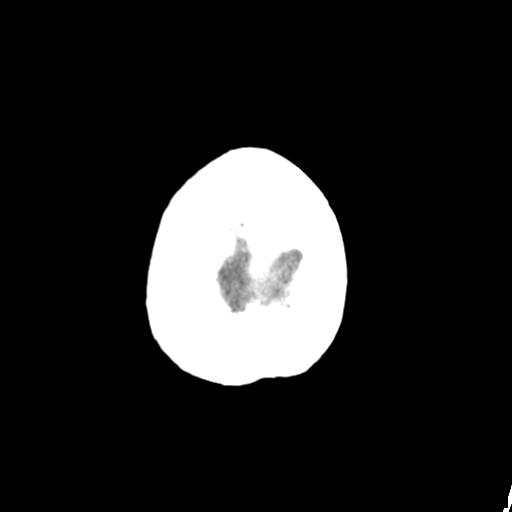

[Series 4: head bone · axial · 0.40mm/px · z∈[-78,-46]mm · 3 of 82 slices shown]
[im 9/82  bone]
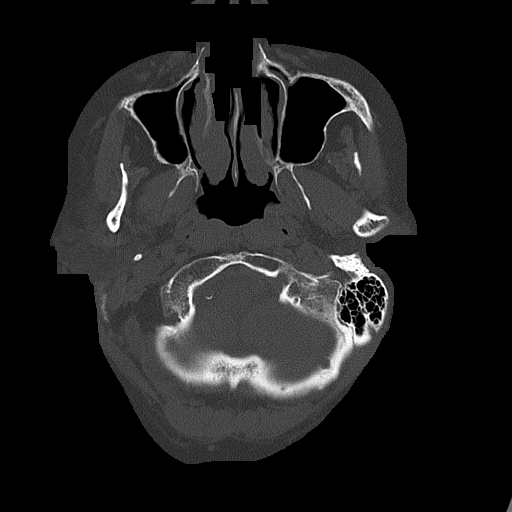
[im 17/82  bone]
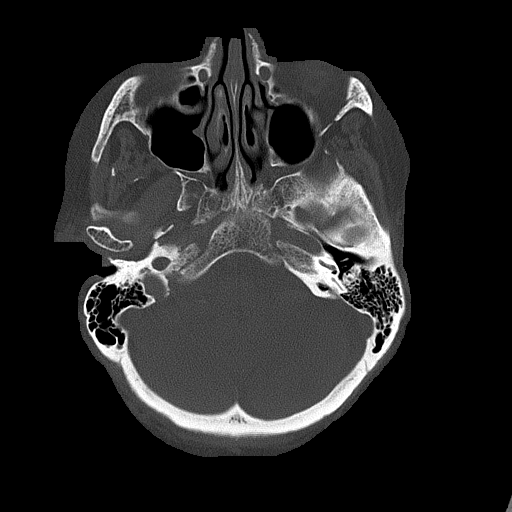
[im 25/82  bone]
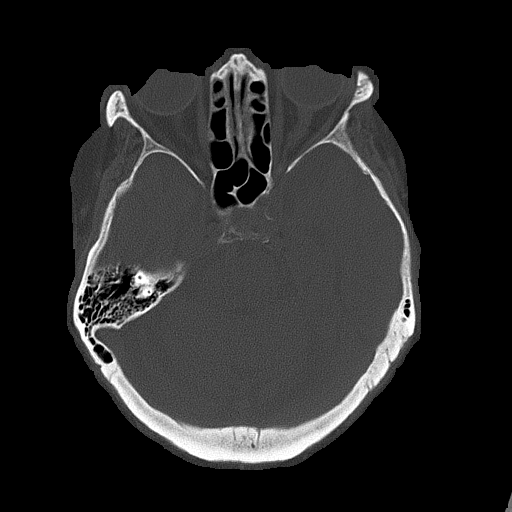

[Series 5: head without cor · coronal · non-contrast · 0.31mm/px · 3 of 67 slices shown]
[im 23/67  brain]
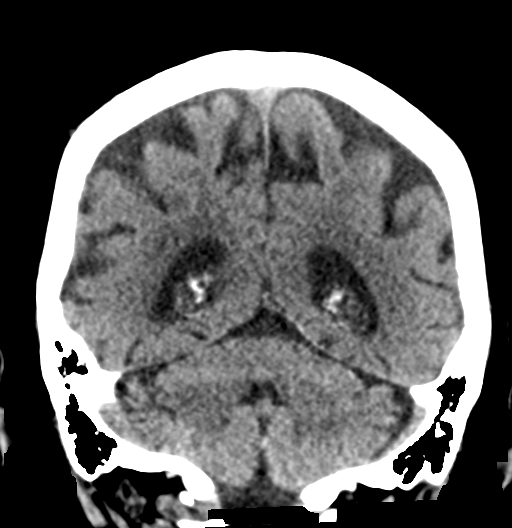
[im 30/67  brain]
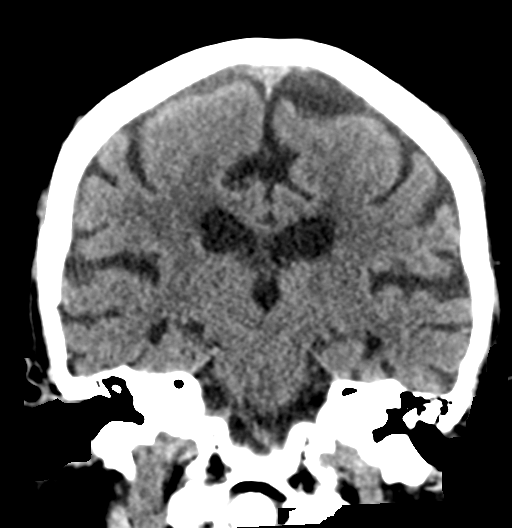
[im 37/67  brain]
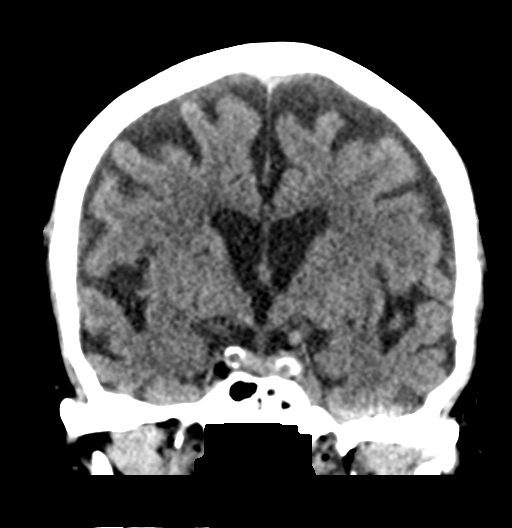

[Series 6: head without sag · sagittal · non-contrast · 0.32mm/px · 3 of 59 slices shown]
[im 20/59  brain]
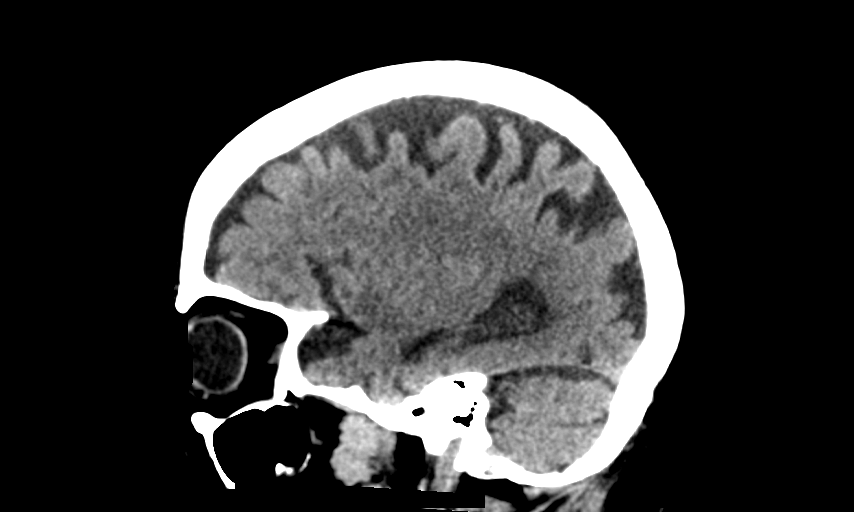
[im 30/59  brain]
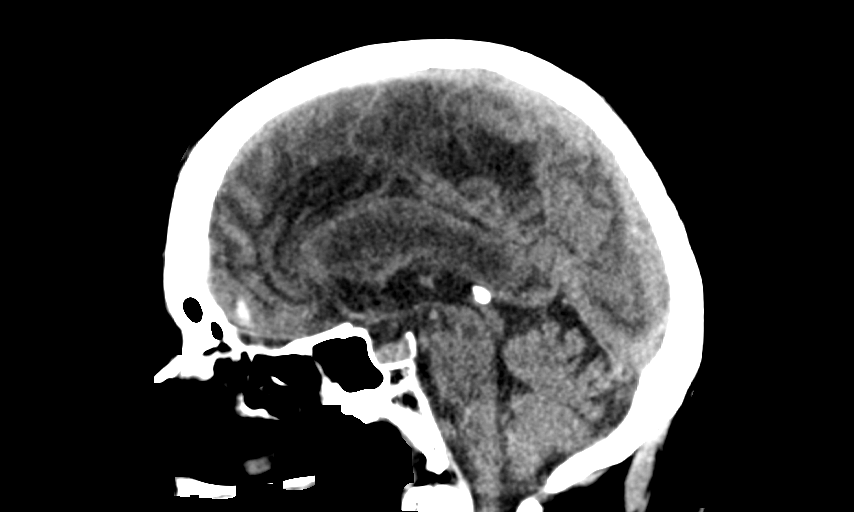
[im 39/59  brain]
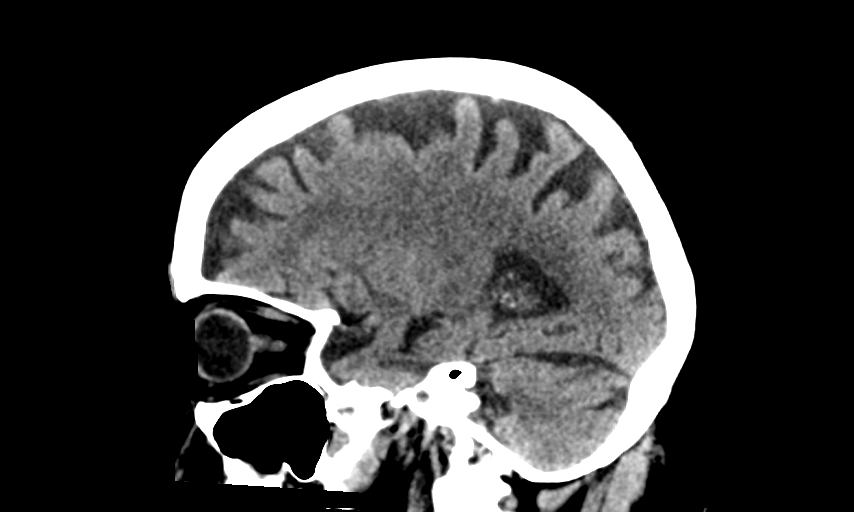

[16 of 47 positions shown; findings below may reference images not displayed]

FINDINGS: Brain: Mild diffuse cortical atrophy is noted. Mild chronic ischemic
white matter disease is noted. No mass effect or midline shift is
noted. Ventricular size is within normal limits. There is no
evidence of mass lesion, hemorrhage or acute infarction.

Vascular: No hyperdense vessel or unexpected calcification.

Skull: Normal. Negative for fracture or focal lesion.

Sinuses/Orbits: No acute finding.

Other: None.
IMPRESSION: Mild diffuse cortical atrophy. Mild chronic ischemic white matter
disease. No acute intracranial abnormality seen.

## 2018-07-01 IMAGING — DX DG CHEST 1V PORT
1 series · 1 of 1 positions shown · non-contrast
Comparison: 03/25/2017.

CLINICAL DATA: After eating, patient became unresponsive.

EXAM:
PORTABLE CHEST 1 VIEW

[chest ap]
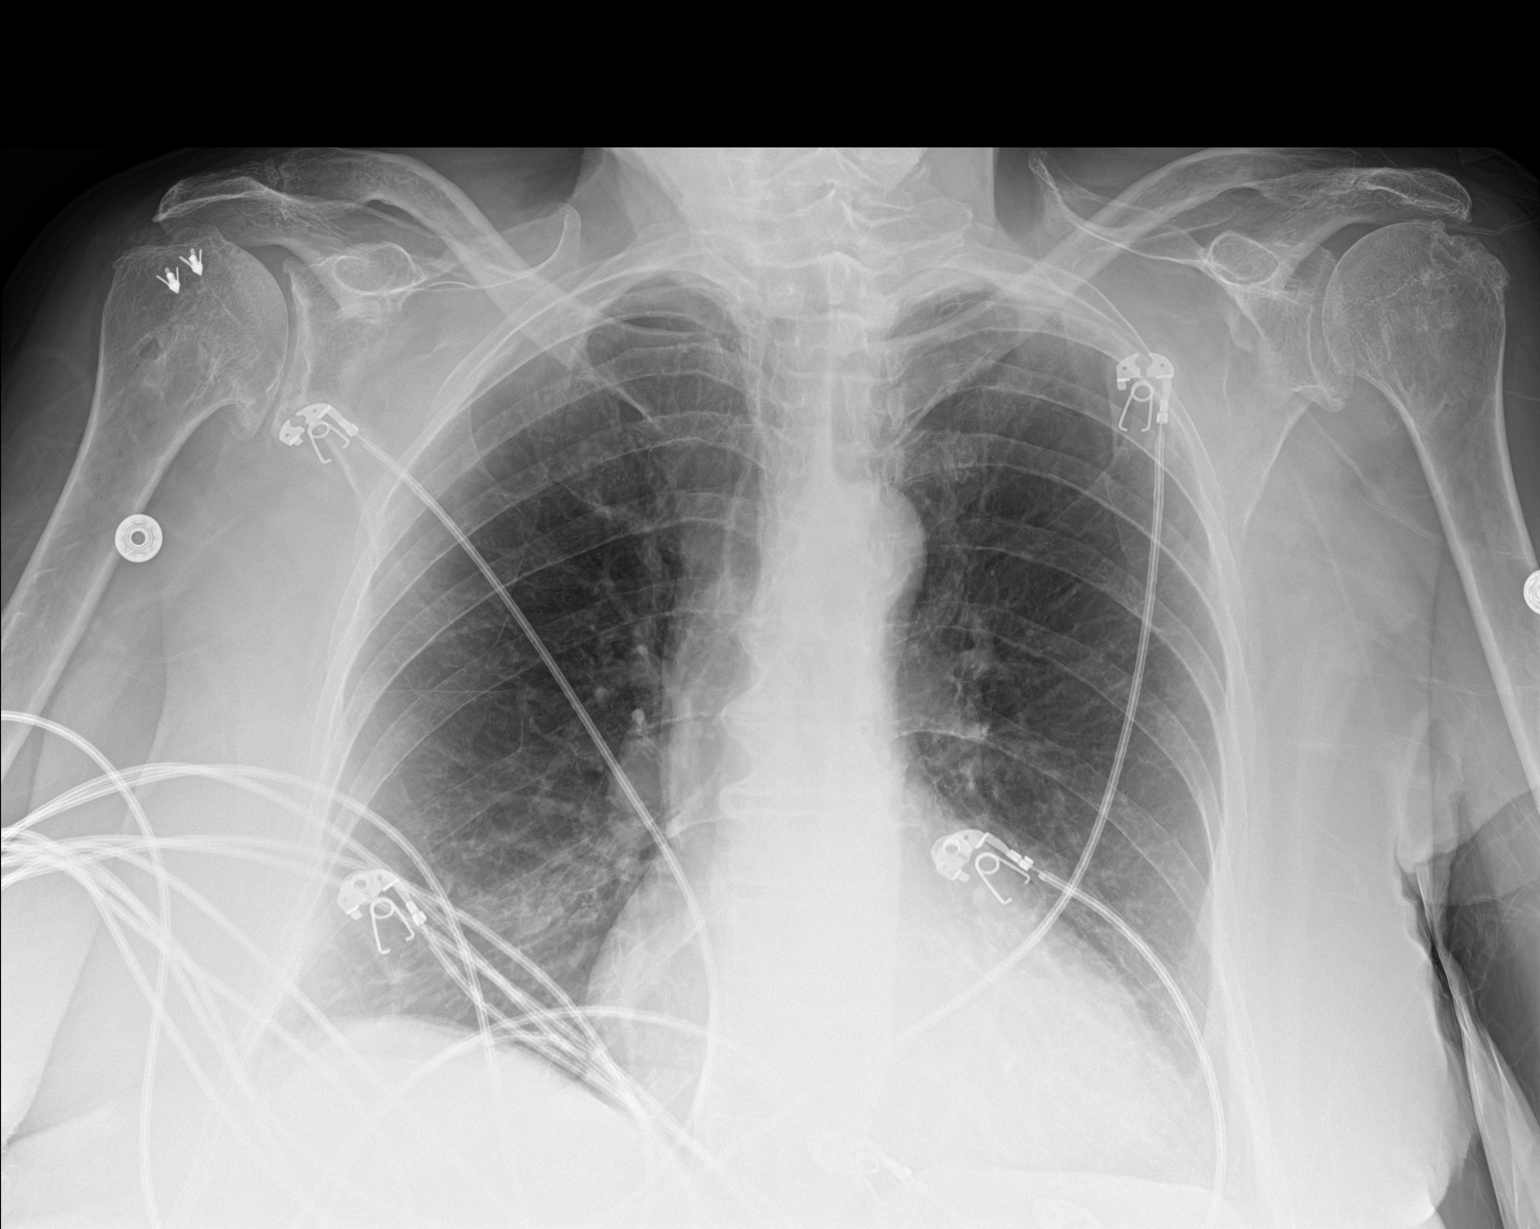

[1 of 1 positions shown; findings below may reference images not displayed]

FINDINGS: Mild cardiac enlargement. No consolidation or edema. No
pneumothorax. Osteopenia with degenerative change both shoulders.
IMPRESSION: Cardiomegaly similar to priors. No acute chest findings are evident.

## 2019-02-14 IMAGING — CT CT HEAD W/O CM
3 series · 14 of 47 positions shown, 16 images · non-contrast
Comparison: 04/13/2017

CLINICAL DATA: Altered level of consciousness

EXAM:
CT HEAD WITHOUT CONTRAST
TECHNIQUE: Contiguous axial images were obtained from the base of the skull
through the vertex without intravenous contrast.

[Series 3: head 5.0 h30s · axial · 0.42mm/px · z∈[-141,+4]mm · 8 of 35 slices shown, 10 images]
[im 3/35  brain]
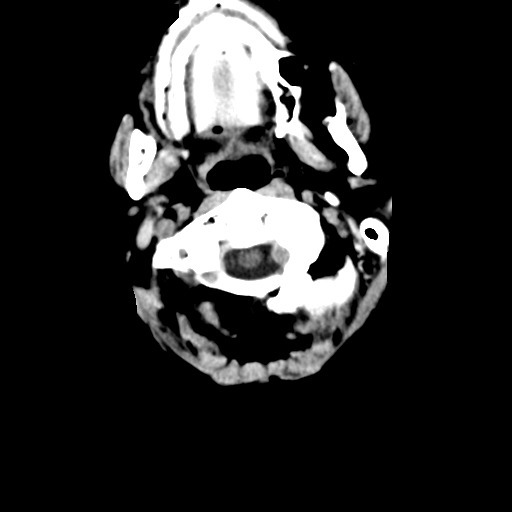
[im 3/35  bone]
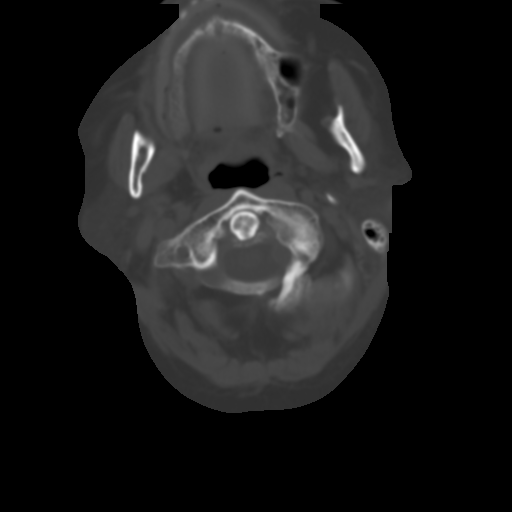
[im 8/35  brain]
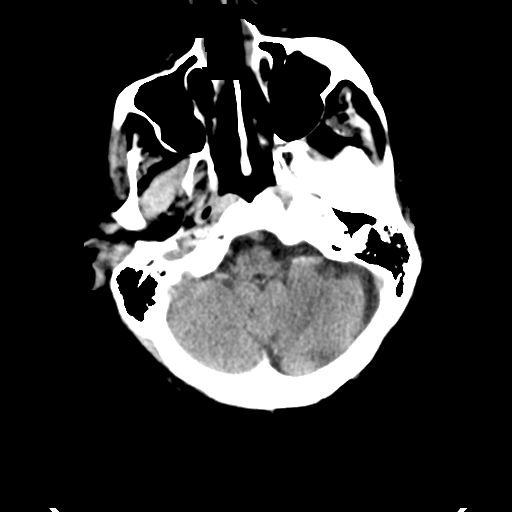
[im 11/35  brain]
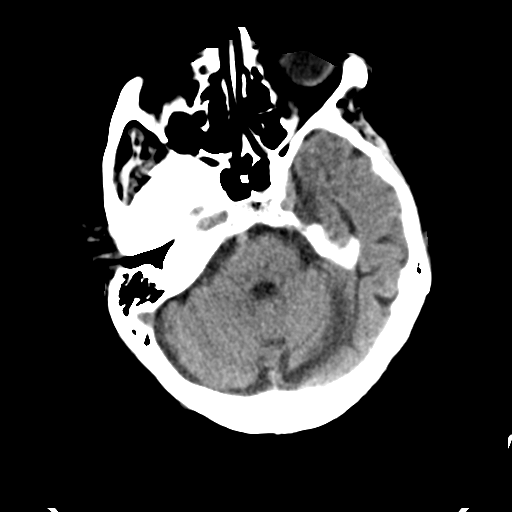
[im 16/35  brain]
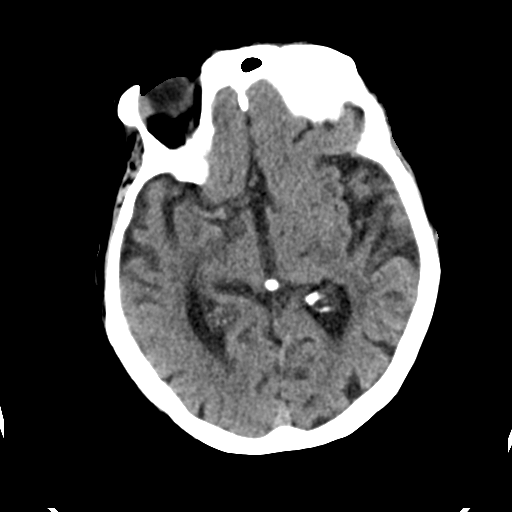
[im 19/35  brain]
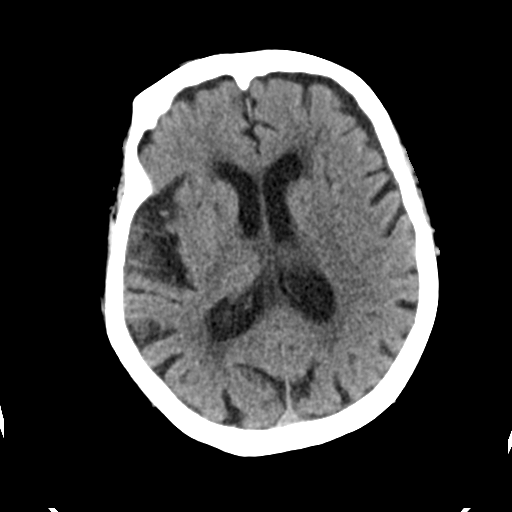
[im 19/35  bone]
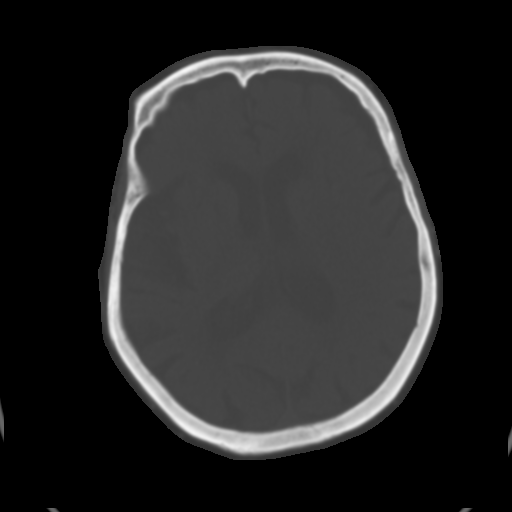
[im 24/35  brain]
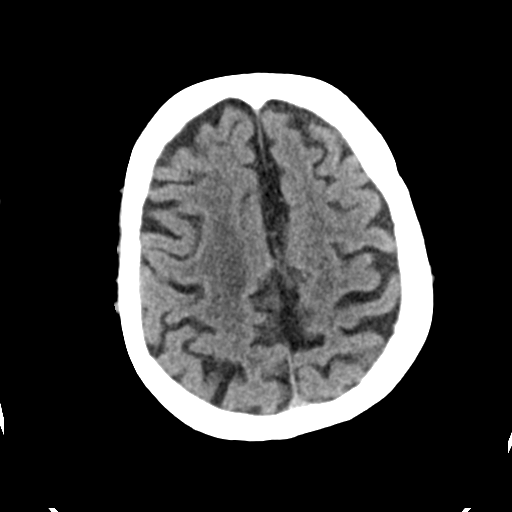
[im 27/35  brain]
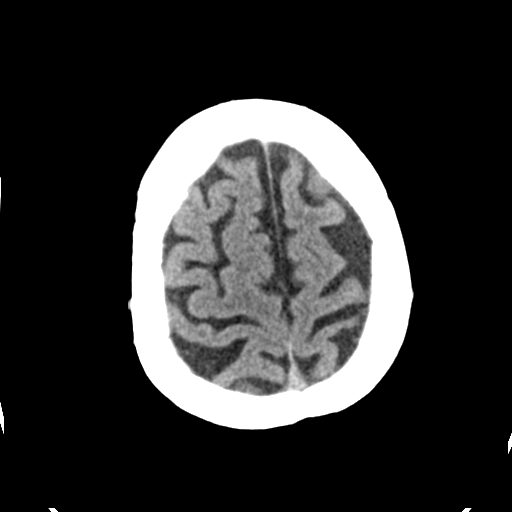
[im 32/35  brain]
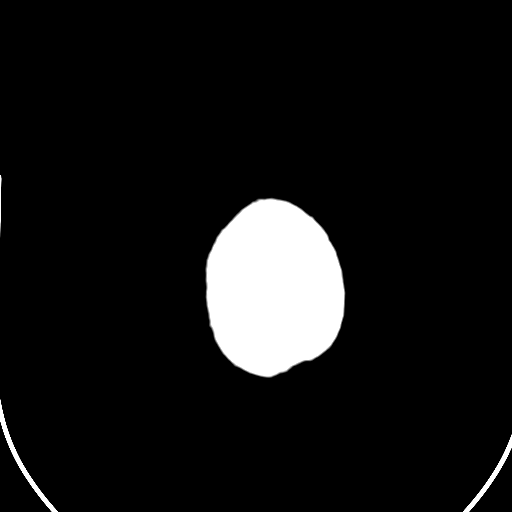

[Series 5: head 3.0 mpr cor · coronal · 0.38mm/px · 3 of 68 slices shown]
[im 23/68  brain]
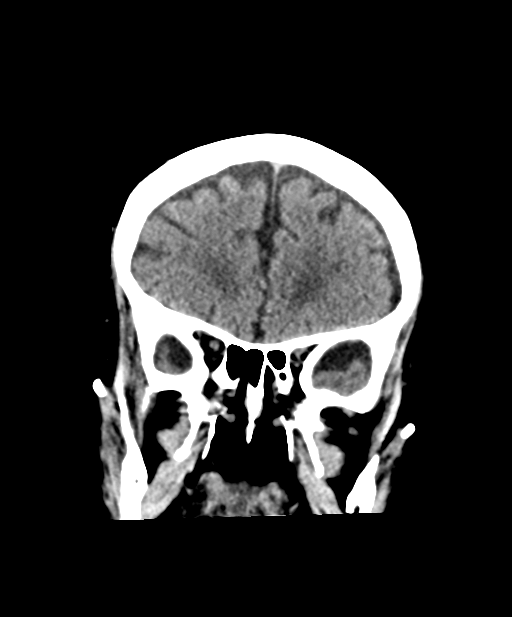
[im 30/68  brain]
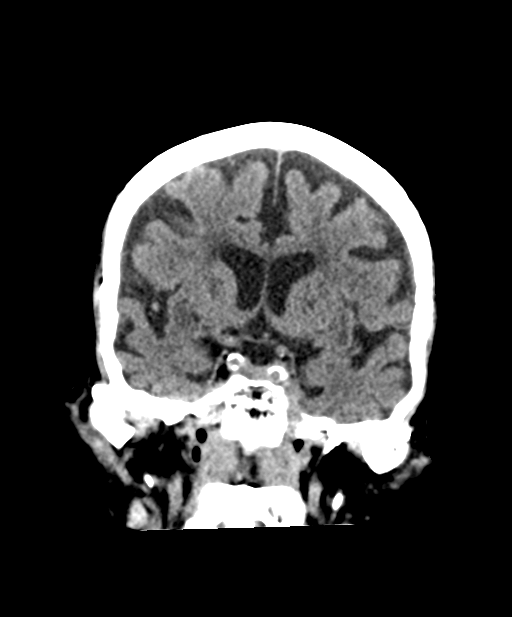
[im 38/68  brain]
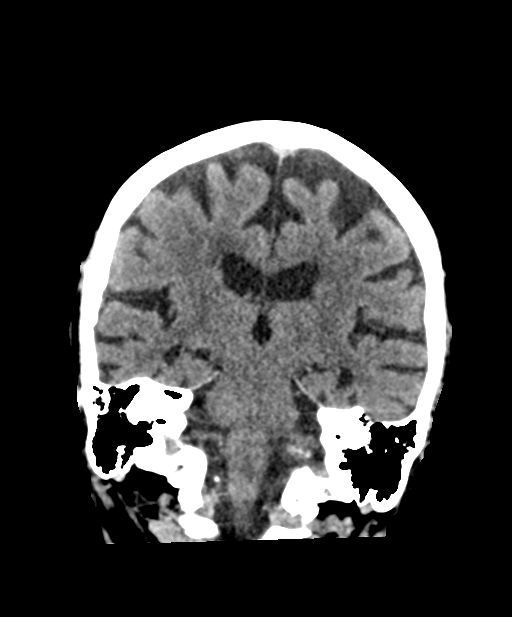

[Series 6: head 3.0 mpr sag · sagittal · 0.34mm/px · 3 of 67 slices shown]
[im 23/67  brain]
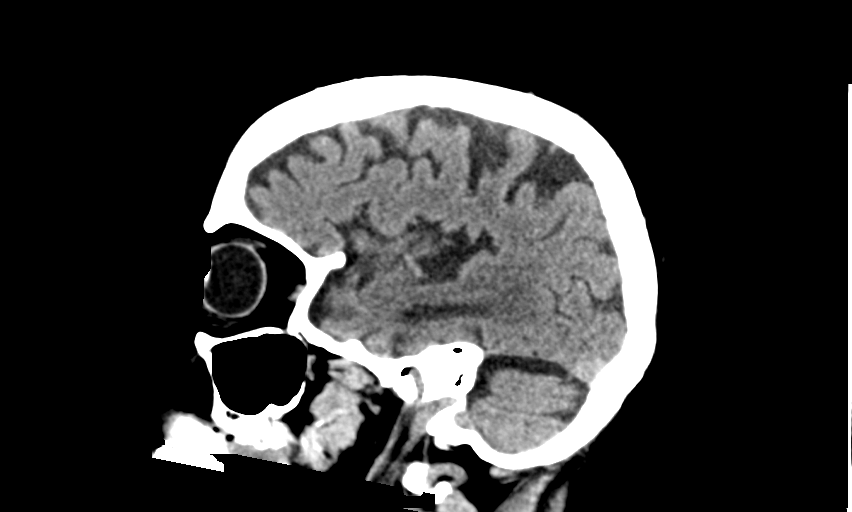
[im 34/67  brain]
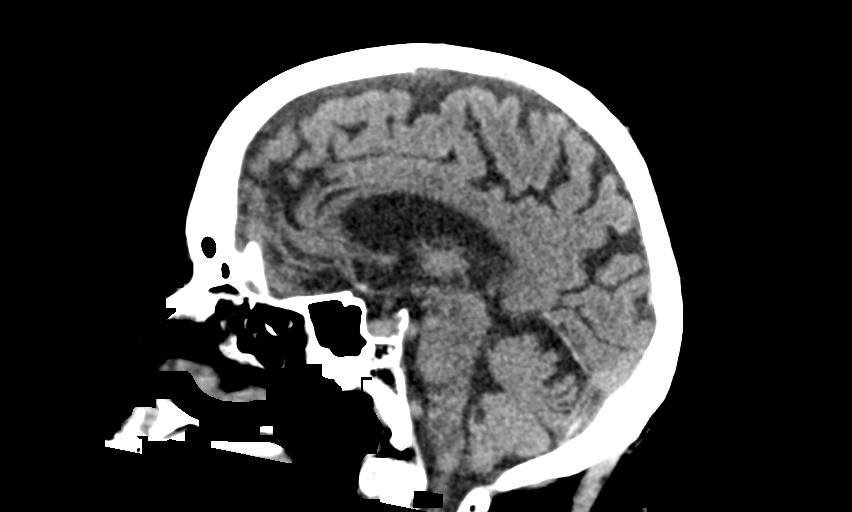
[im 45/67  brain]
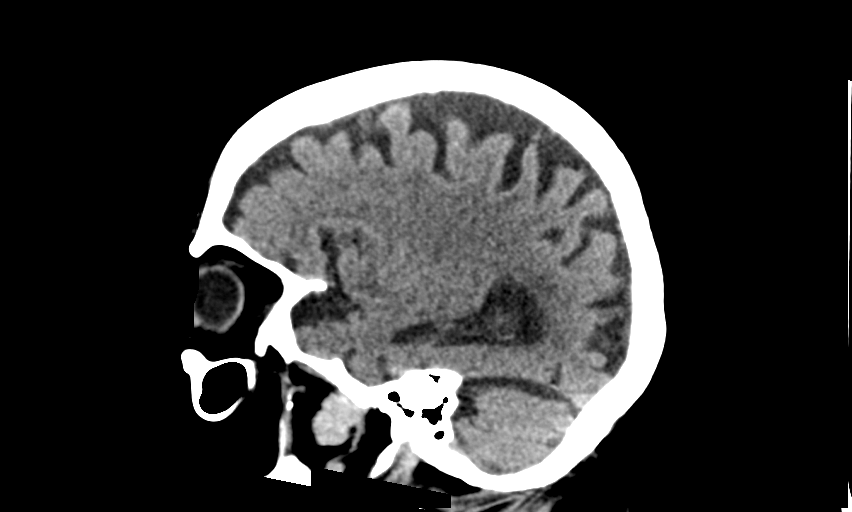

[14 of 47 positions shown; findings below may reference images not displayed]

FINDINGS: Brain: Involutional changes the brain, likely age related with
mild-to-moderate small vessel ischemic disease of periventricular
and subcortical white matter. Right basal ganglial and left caudate
lacunar infarcts are noted. No large vascular territory infarction,
hemorrhage or midline shift. No intra-axial mass nor extra-axial
fluid collections.

Vascular: No hyperdense vessel or unexpected calcifications.

Skull: No skull fracture or significant calvarial swelling.

Sinuses/Orbits: Minimal mucosal thickening of the ethmoid and right
maxillary sinuses. Bilateral lens replacements.

Other: None
IMPRESSION: Atrophy with chronic small vessel ischemia. No acute intracranial
abnormality.

## 2019-02-14 IMAGING — DX DG CHEST 1V PORT
1 series · 1 of 1 positions shown · non-contrast
Comparison: April 13, 2017

CLINICAL DATA: Allergic reaction.

EXAM:
PORTABLE CHEST 1 VIEW

[chest]
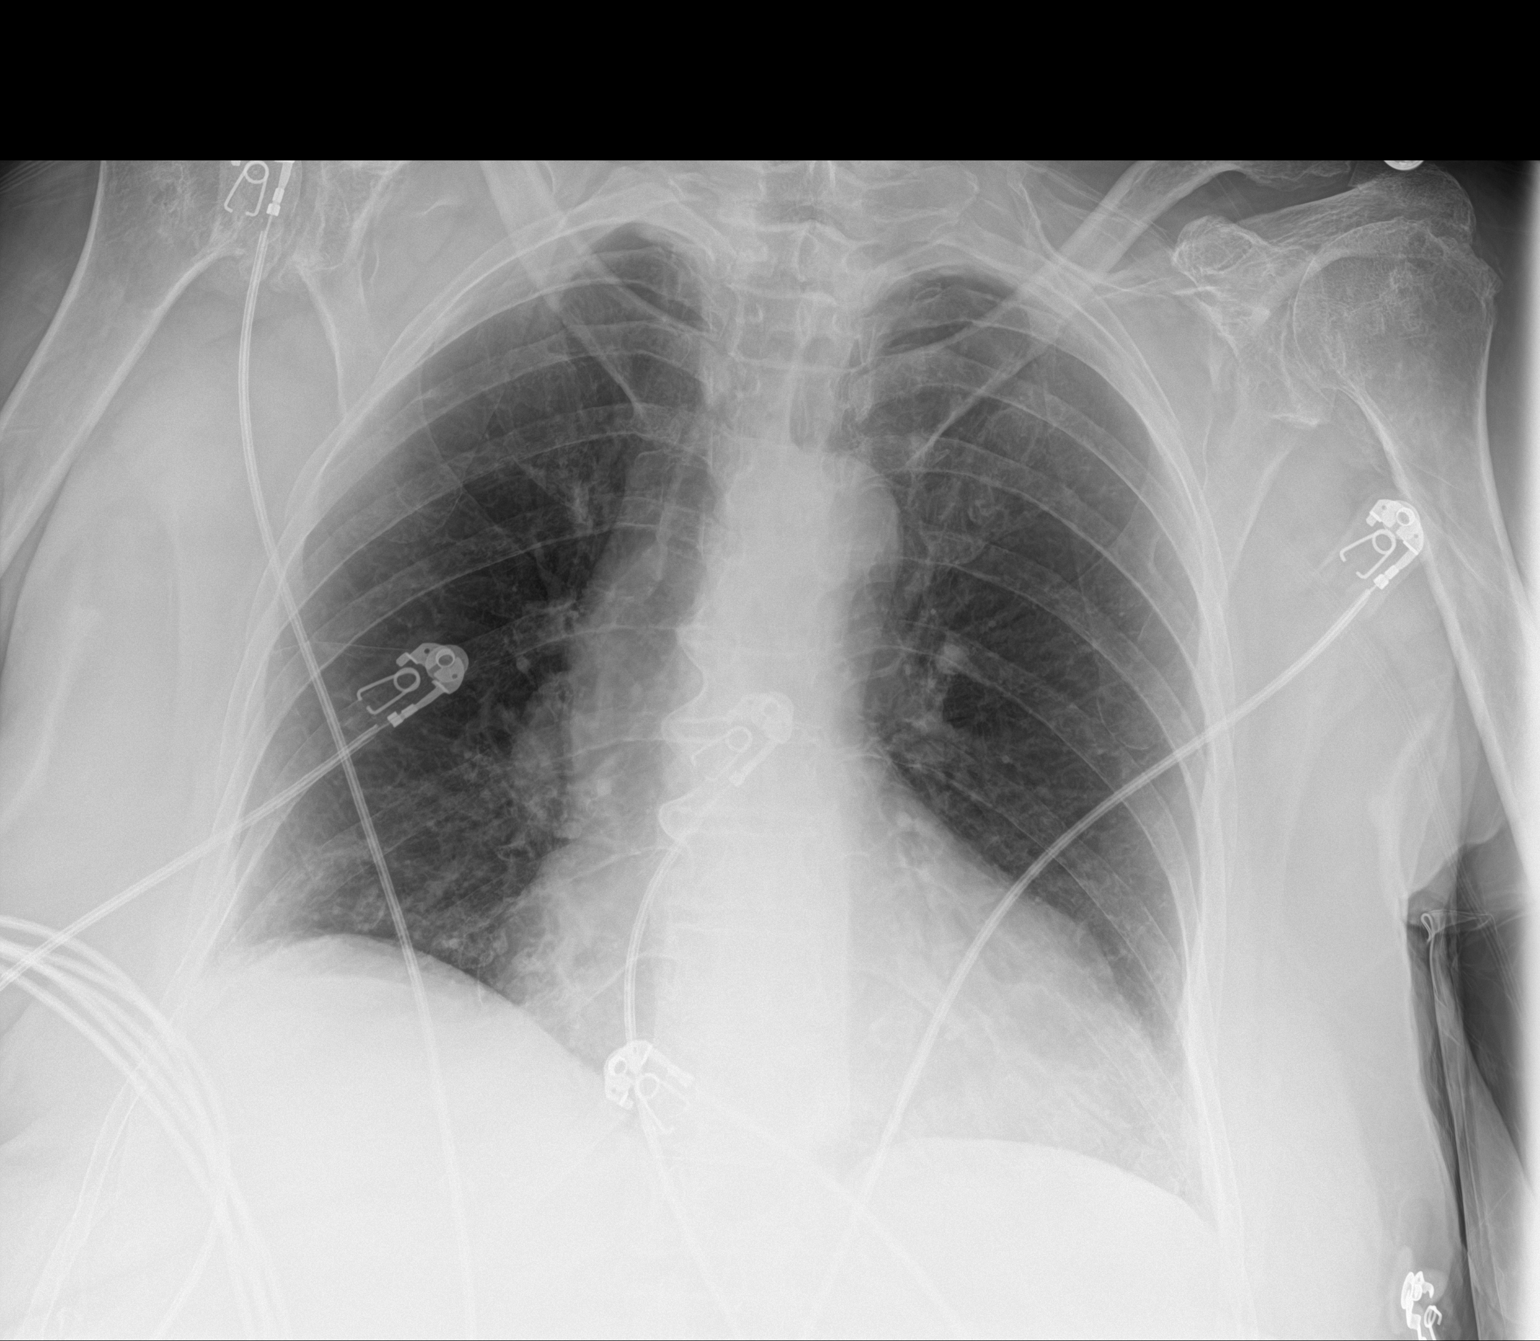

[1 of 1 positions shown; findings below may reference images not displayed]

FINDINGS: The heart size and mediastinal contours are within normal limits.
Both lungs are clear. The visualized skeletal structures are
unremarkable.
IMPRESSION: No active disease.

## 2019-02-15 IMAGING — CT CT ABD-PELV W/ CM
2 of 5 series · 16 of 46 positions shown, 18 images · IV contrast (Omni 300)
Comparison: None.

CLINICAL DATA: Acute abdominal pain, generalized

EXAM:
CT ABDOMEN AND PELVIS WITH CONTRAST
TECHNIQUE: Multidetector CT imaging of the abdomen and pelvis was performed
using the standard protocol following bolus administration of
intravenous contrast.
CONTRAST:  100mL OMNIPAQUE IOHEXOL 300 MG/ML  SOLN

[Series 3: a/p w/ 5mm · axial · 0.75mm/px · z∈[+847,+1237]mm · 13 of 88 slices shown, 15 images]
[im 5/88  soft-tissue]
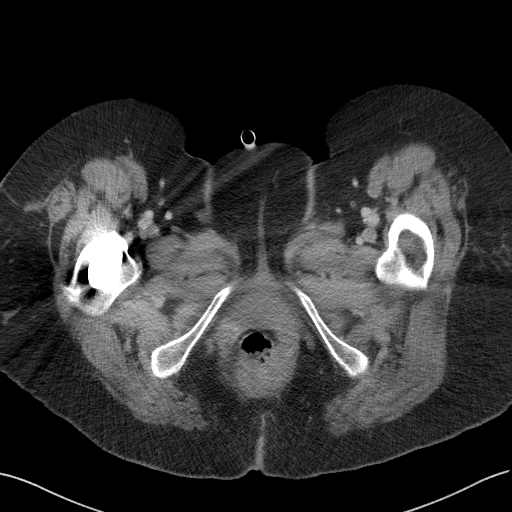
[im 5/88  bone]
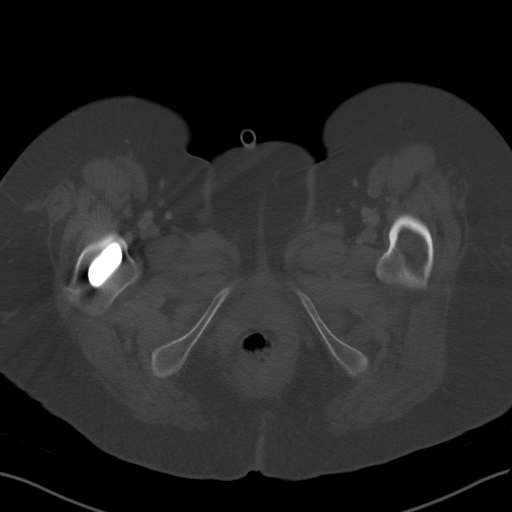
[im 14/88  soft-tissue]
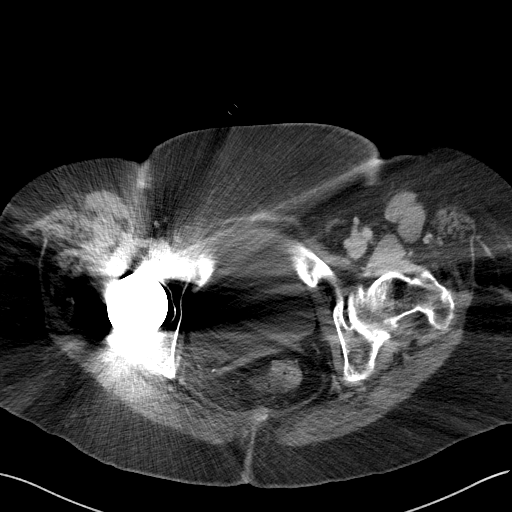
[im 18/88  soft-tissue]
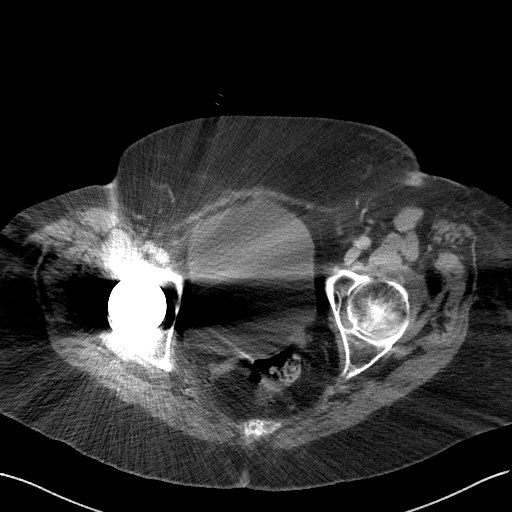
[im 27/88  soft-tissue]
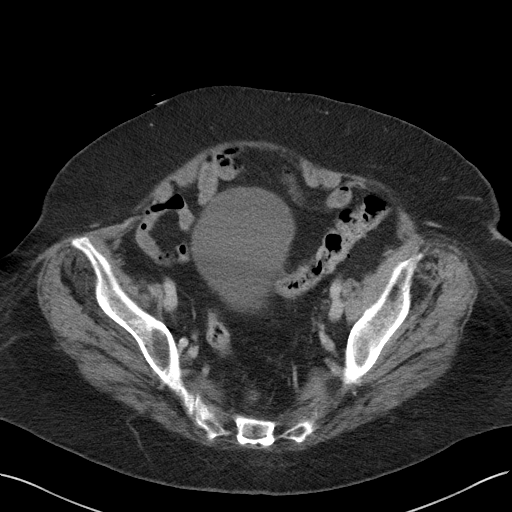
[im 31/88  soft-tissue]
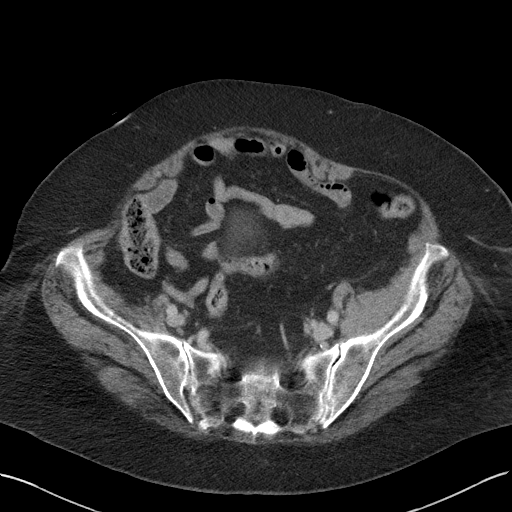
[im 40/88  soft-tissue]
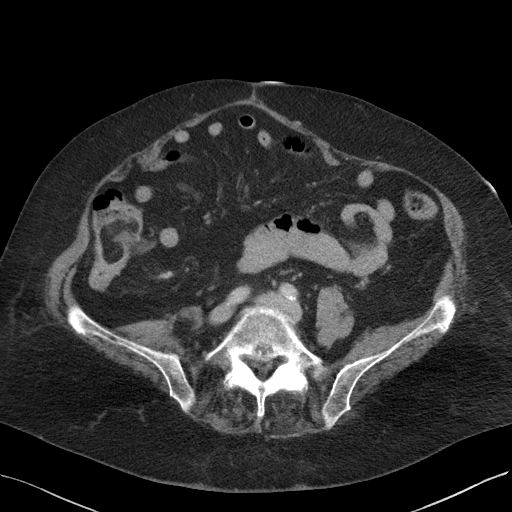
[im 44/88  soft-tissue]
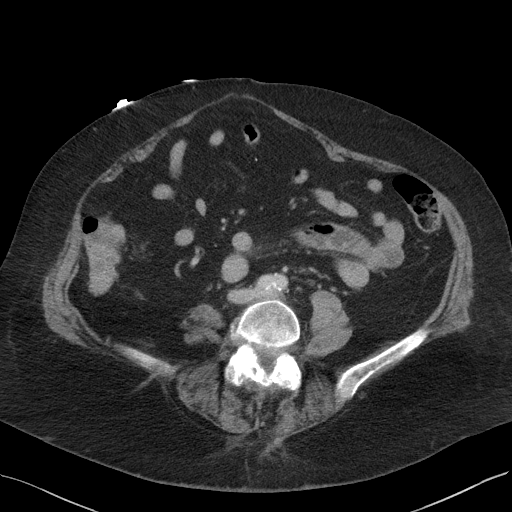
[im 48/88  soft-tissue]
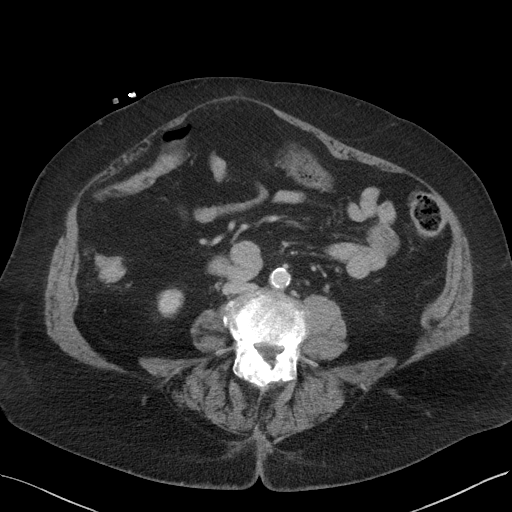
[im 57/88  soft-tissue]
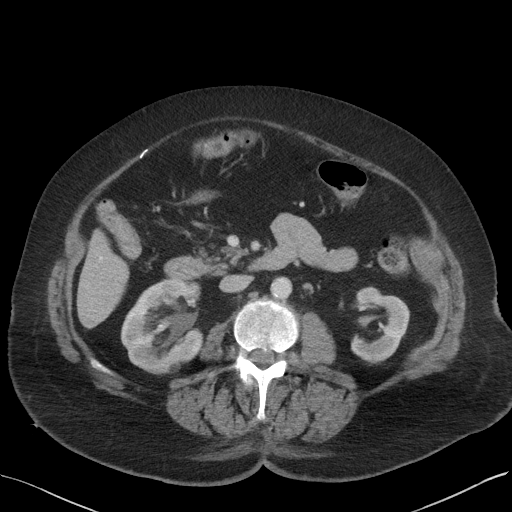
[im 57/88  bone]
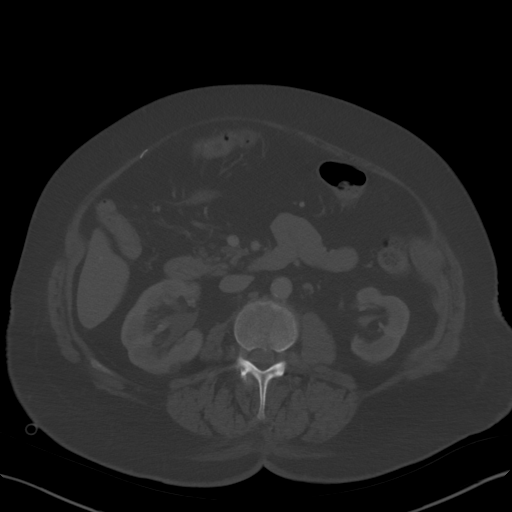
[im 61/88  soft-tissue]
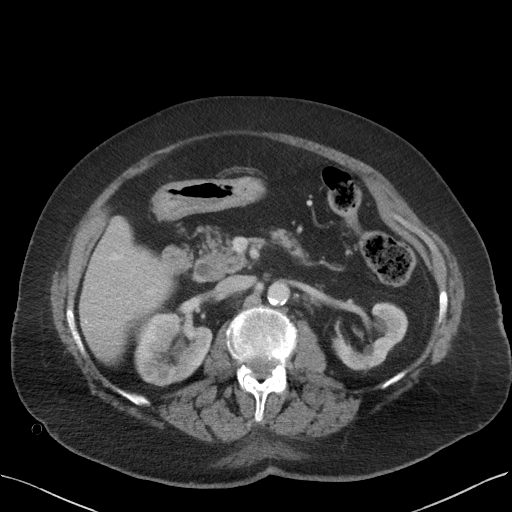
[im 70/88  soft-tissue]
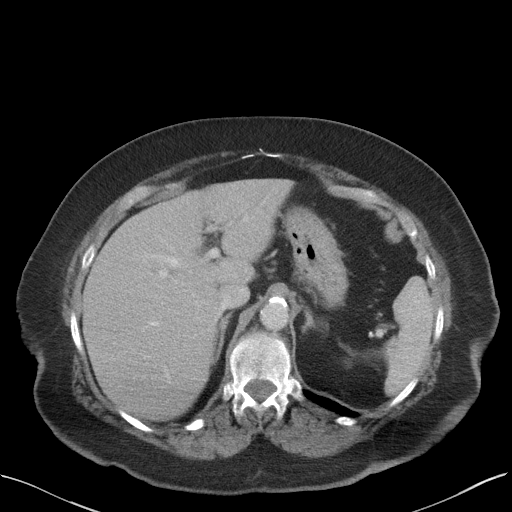
[im 74/88  soft-tissue]
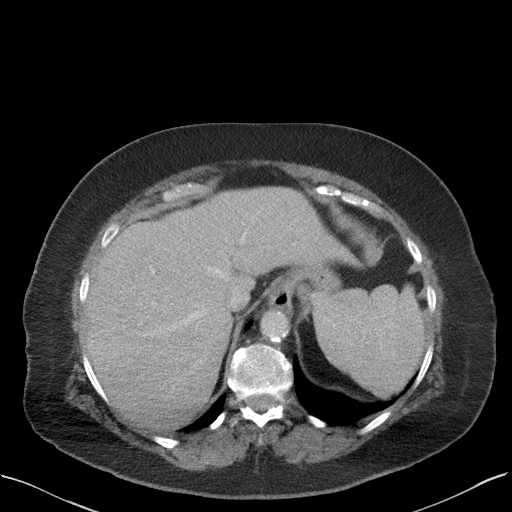
[im 83/88  soft-tissue]
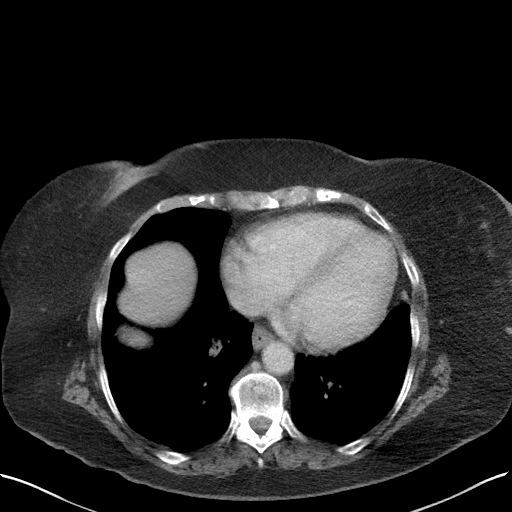

[Series 6: a/p w/ cor · coronal · 0.69mm/px · 3 of 151 slices shown]
[im 51/151  soft-tissue]
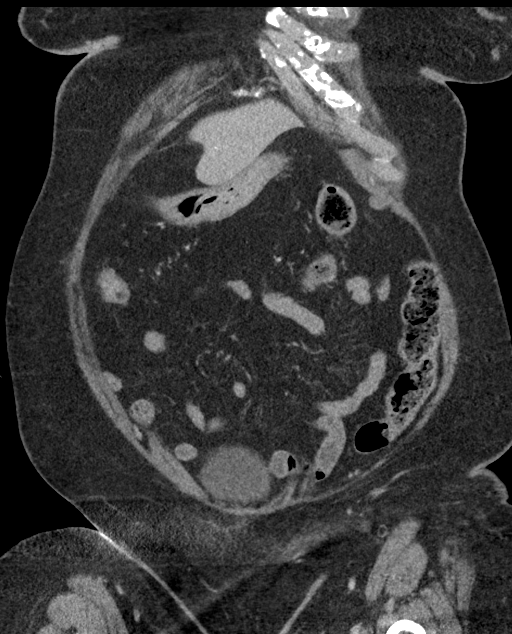
[im 67/151  soft-tissue]
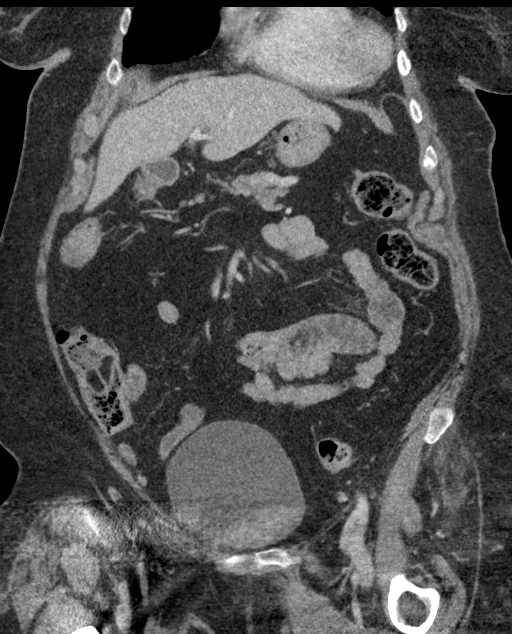
[im 84/151  soft-tissue]
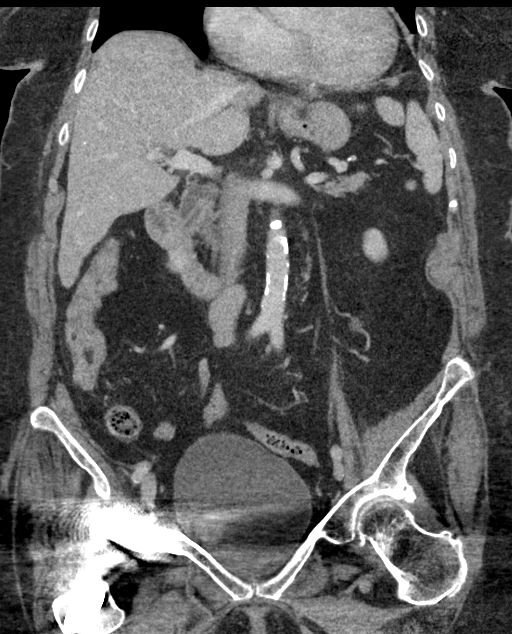

[16 of 46 positions shown; findings below may reference images not displayed]

FINDINGS: Lower chest: Streaky opacity at the right base consistent with mild
atelectasis.

Hepatobiliary: Tiny low-density in the central right liver is too
small for densitometry.Cholecystectomy. Reservoir effect and age
accounts for bile duct prominence.

Pancreas: Generalized atrophy

Spleen: Unremarkable.

Adrenals/Urinary Tract: Negative adrenals. Branching left lower
renal calculus. Asymmetric left renal atrophy. Renal sinus cysts on
the right. No hydronephrosis. Moderately distended bladder without
wall thickening

Stomach/Bowel: No obstruction. No inflammatory changes. Lipomatosis
hypertrophy at the ileocecal valve.

Vascular/Lymphatic: Atherosclerosis. No acute finding. no mass or
adenopathy.

Reproductive:Hysterectomy

Other: No ascites or pneumoperitoneum.

Musculoskeletal: Right hip hemiarthroplasty. Disc and facet
degeneration with L4-5 anterolisthesis. L3-4 and L4-5 remote
laminectomy. No acute osseous finding.
IMPRESSION: 1. No acute finding.
2. Moderately distended urinary bladder, please ensure no urinary
retention.
3. Branching left renal calculus.
4.  Aortic Atherosclerosis ([id number]).

## 2019-03-18 IMAGING — DX DG CHEST 1V
1 series · 1 of 1 positions shown · non-contrast
Comparison: Portable chest x-ray December 27, 2017

CLINICAL DATA: Fever, cough, wheezing. Former smoker. History of
asthma.

EXAM:
CHEST  1 VIEW

[chest ap]
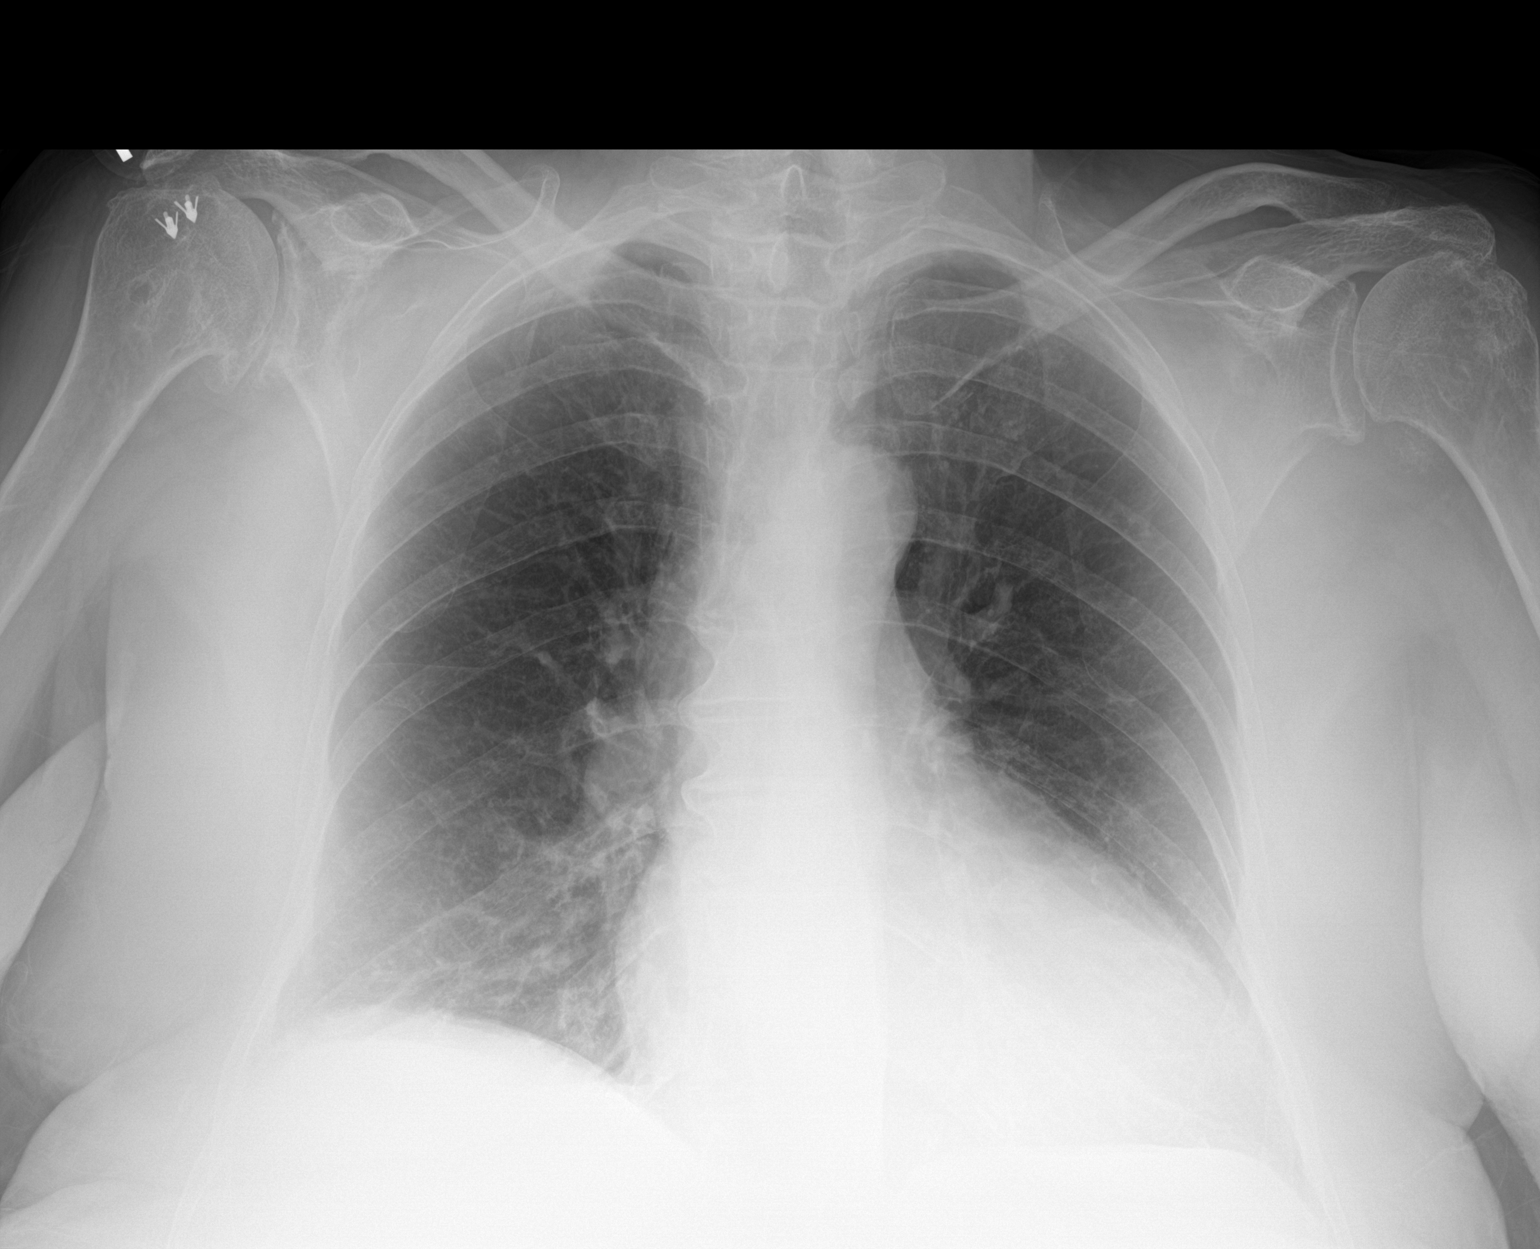

[1 of 1 positions shown; findings below may reference images not displayed]

FINDINGS: The lungs are adequately inflated. There is no focal infiltrate.
Subtle increased interstitial markings are noted in the right
infrahilar region. There is no pleural effusion. The cardiac
silhouette is mildly enlarged. The pulmonary vascularity is normal.
The mediastinum is normal in width. There is calcification in the
wall of the aortic arch. There is mild multilevel degenerative disc
disease with endplate spurring. There are degenerative changes of
both shoulders.
IMPRESSION: No alveolar pneumonia. Subsegmental atelectasis or early infiltrate
in the right infrahilar region. When the patient can tolerate the
procedure, a PA and lateral chest x-ray would be useful.

Thoracic aortic atherosclerosis.
